# Patient Record
Sex: Male | Born: 1940
Health system: Southern US, Community
[De-identification: ages and names within clinical notes are randomized; demographics above are authoritative.]

## PROBLEM LIST (undated history)

## (undated) DIAGNOSIS — M1990 Unspecified osteoarthritis, unspecified site: Secondary | ICD-10-CM

## (undated) DIAGNOSIS — G709 Myoneural disorder, unspecified: Secondary | ICD-10-CM

## (undated) DIAGNOSIS — I4729 Other ventricular tachycardia: Secondary | ICD-10-CM

## (undated) DIAGNOSIS — G4730 Sleep apnea, unspecified: Secondary | ICD-10-CM

## (undated) DIAGNOSIS — I4510 Unspecified right bundle-branch block: Secondary | ICD-10-CM

## (undated) DIAGNOSIS — B019 Varicella without complication: Secondary | ICD-10-CM

## (undated) DIAGNOSIS — I10 Essential (primary) hypertension: Secondary | ICD-10-CM

## (undated) DIAGNOSIS — Z9289 Personal history of other medical treatment: Secondary | ICD-10-CM

## (undated) DIAGNOSIS — K219 Gastro-esophageal reflux disease without esophagitis: Secondary | ICD-10-CM

## (undated) DIAGNOSIS — K2970 Gastritis, unspecified, without bleeding: Secondary | ICD-10-CM

## (undated) DIAGNOSIS — K589 Irritable bowel syndrome without diarrhea: Secondary | ICD-10-CM

## (undated) DIAGNOSIS — I471 Supraventricular tachycardia, unspecified: Secondary | ICD-10-CM

## (undated) DIAGNOSIS — I700 Atherosclerosis of aorta: Secondary | ICD-10-CM

## (undated) DIAGNOSIS — I499 Cardiac arrhythmia, unspecified: Secondary | ICD-10-CM

## (undated) DIAGNOSIS — K635 Polyp of colon: Secondary | ICD-10-CM

## (undated) DIAGNOSIS — I509 Heart failure, unspecified: Secondary | ICD-10-CM

## (undated) DIAGNOSIS — H269 Unspecified cataract: Secondary | ICD-10-CM

## (undated) DIAGNOSIS — A048 Other specified bacterial intestinal infections: Secondary | ICD-10-CM

## (undated) DIAGNOSIS — I219 Acute myocardial infarction, unspecified: Secondary | ICD-10-CM

## (undated) DIAGNOSIS — Z8679 Personal history of other diseases of the circulatory system: Secondary | ICD-10-CM

## (undated) DIAGNOSIS — K5790 Diverticulosis of intestine, part unspecified, without perforation or abscess without bleeding: Secondary | ICD-10-CM

## (undated) DIAGNOSIS — F32A Depression, unspecified: Secondary | ICD-10-CM

## (undated) DIAGNOSIS — E785 Hyperlipidemia, unspecified: Secondary | ICD-10-CM

## (undated) DIAGNOSIS — F1020 Alcohol dependence, uncomplicated: Secondary | ICD-10-CM

## (undated) DIAGNOSIS — I519 Heart disease, unspecified: Secondary | ICD-10-CM

## (undated) DIAGNOSIS — C189 Malignant neoplasm of colon, unspecified: Secondary | ICD-10-CM

## (undated) DIAGNOSIS — I779 Disorder of arteries and arterioles, unspecified: Secondary | ICD-10-CM

## (undated) DIAGNOSIS — F1910 Other psychoactive substance abuse, uncomplicated: Secondary | ICD-10-CM

## (undated) DIAGNOSIS — T7840XA Allergy, unspecified, initial encounter: Secondary | ICD-10-CM

## (undated) DIAGNOSIS — R55 Syncope and collapse: Secondary | ICD-10-CM

## (undated) DIAGNOSIS — K5792 Diverticulitis of intestine, part unspecified, without perforation or abscess without bleeding: Secondary | ICD-10-CM

## (undated) DIAGNOSIS — C61 Malignant neoplasm of prostate: Secondary | ICD-10-CM

## (undated) DIAGNOSIS — F329 Major depressive disorder, single episode, unspecified: Secondary | ICD-10-CM

## (undated) DIAGNOSIS — I4891 Unspecified atrial fibrillation: Secondary | ICD-10-CM

## (undated) DIAGNOSIS — I639 Cerebral infarction, unspecified: Secondary | ICD-10-CM

## (undated) HISTORY — DX: Alcohol dependence, uncomplicated: F10.20

## (undated) HISTORY — DX: Unspecified osteoarthritis, unspecified site: M19.90

## (undated) HISTORY — DX: Polyp of colon: K63.5

## (undated) HISTORY — DX: Unspecified atrial fibrillation: I48.91

## (undated) HISTORY — DX: Myoneural disorder, unspecified: G70.9

## (undated) HISTORY — DX: Allergy, unspecified, initial encounter: T78.40XA

## (undated) HISTORY — DX: Personal history of other medical treatment: Z92.89

## (undated) HISTORY — DX: Malignant neoplasm of prostate: C61

## (undated) HISTORY — PX: CATARACT EXTRACTION, BILATERAL: SHX1313

## (undated) HISTORY — DX: Heart failure, unspecified: I50.9

## (undated) HISTORY — DX: Acute myocardial infarction, unspecified: I21.9

## (undated) HISTORY — PX: PROSTATE SURGERY: SHX751

## (undated) HISTORY — DX: Disorder of arteries and arterioles, unspecified: I77.9

## (undated) HISTORY — PX: ESOPHAGOGASTRODUODENOSCOPY: SHX1529

## (undated) HISTORY — PX: FRACTURE SURGERY: SHX138

## (undated) HISTORY — DX: Supraventricular tachycardia: I47.1

## (undated) HISTORY — DX: Supraventricular tachycardia, unspecified: I47.10

## (undated) HISTORY — DX: Irritable bowel syndrome, unspecified: K58.9

## (undated) HISTORY — DX: Other psychoactive substance abuse, uncomplicated: F19.10

## (undated) HISTORY — DX: Unspecified cataract: H26.9

## (undated) HISTORY — DX: Diverticulitis of intestine, part unspecified, without perforation or abscess without bleeding: K57.92

## (undated) HISTORY — DX: Essential (primary) hypertension: I10

## (undated) HISTORY — DX: Diverticulosis of intestine, part unspecified, without perforation or abscess without bleeding: K57.90

## (undated) HISTORY — PX: TONSILLECTOMY: SUR1361

## (undated) HISTORY — DX: Hyperlipidemia, unspecified: E78.5

## (undated) HISTORY — PX: KNEE ARTHROSCOPY: SUR90

## (undated) HISTORY — DX: Sleep apnea, unspecified: G47.30

## (undated) HISTORY — PX: EYE SURGERY: SHX253

## (undated) SURGERY — CLOSURE, COLOSTOMY, LAPAROSCOPIC
Anesthesia: General

---

## 1993-04-18 DIAGNOSIS — I4891 Unspecified atrial fibrillation: Secondary | ICD-10-CM

## 1993-04-18 HISTORY — DX: Unspecified atrial fibrillation: I48.91

## 1996-04-18 HISTORY — PX: CARDIAC CATHETERIZATION: SHX172

## 2010-04-18 DIAGNOSIS — C61 Malignant neoplasm of prostate: Secondary | ICD-10-CM

## 2010-04-18 HISTORY — DX: Malignant neoplasm of prostate: C61

## 2013-04-20 DIAGNOSIS — I10 Essential (primary) hypertension: Secondary | ICD-10-CM | POA: Diagnosis not present

## 2013-04-20 DIAGNOSIS — R109 Unspecified abdominal pain: Secondary | ICD-10-CM | POA: Diagnosis not present

## 2013-04-20 DIAGNOSIS — K589 Irritable bowel syndrome without diarrhea: Secondary | ICD-10-CM | POA: Diagnosis not present

## 2013-04-20 DIAGNOSIS — E785 Hyperlipidemia, unspecified: Secondary | ICD-10-CM | POA: Diagnosis not present

## 2013-04-24 DIAGNOSIS — L298 Other pruritus: Secondary | ICD-10-CM | POA: Diagnosis not present

## 2013-04-24 DIAGNOSIS — Z85828 Personal history of other malignant neoplasm of skin: Secondary | ICD-10-CM | POA: Diagnosis not present

## 2013-04-24 DIAGNOSIS — D294 Benign neoplasm of scrotum: Secondary | ICD-10-CM | POA: Diagnosis not present

## 2013-04-24 DIAGNOSIS — L089 Local infection of the skin and subcutaneous tissue, unspecified: Secondary | ICD-10-CM | POA: Diagnosis not present

## 2013-04-24 DIAGNOSIS — L570 Actinic keratosis: Secondary | ICD-10-CM | POA: Diagnosis not present

## 2013-04-24 DIAGNOSIS — L259 Unspecified contact dermatitis, unspecified cause: Secondary | ICD-10-CM | POA: Diagnosis not present

## 2013-04-24 DIAGNOSIS — L578 Other skin changes due to chronic exposure to nonionizing radiation: Secondary | ICD-10-CM | POA: Diagnosis not present

## 2013-04-24 DIAGNOSIS — R209 Unspecified disturbances of skin sensation: Secondary | ICD-10-CM | POA: Diagnosis not present

## 2013-05-27 DIAGNOSIS — E785 Hyperlipidemia, unspecified: Secondary | ICD-10-CM | POA: Diagnosis not present

## 2013-05-27 DIAGNOSIS — Z79899 Other long term (current) drug therapy: Secondary | ICD-10-CM | POA: Diagnosis not present

## 2013-05-27 DIAGNOSIS — Z0189 Encounter for other specified special examinations: Secondary | ICD-10-CM | POA: Diagnosis not present

## 2013-05-27 DIAGNOSIS — Z125 Encounter for screening for malignant neoplasm of prostate: Secondary | ICD-10-CM | POA: Diagnosis not present

## 2013-05-27 DIAGNOSIS — K219 Gastro-esophageal reflux disease without esophagitis: Secondary | ICD-10-CM | POA: Diagnosis not present

## 2013-05-27 DIAGNOSIS — I10 Essential (primary) hypertension: Secondary | ICD-10-CM | POA: Diagnosis not present

## 2013-05-27 DIAGNOSIS — E7800 Pure hypercholesterolemia, unspecified: Secondary | ICD-10-CM | POA: Diagnosis not present

## 2013-05-27 DIAGNOSIS — K589 Irritable bowel syndrome without diarrhea: Secondary | ICD-10-CM | POA: Diagnosis not present

## 2013-05-30 DIAGNOSIS — E785 Hyperlipidemia, unspecified: Secondary | ICD-10-CM | POA: Diagnosis not present

## 2013-05-30 DIAGNOSIS — I10 Essential (primary) hypertension: Secondary | ICD-10-CM | POA: Diagnosis not present

## 2013-06-18 DIAGNOSIS — C61 Malignant neoplasm of prostate: Secondary | ICD-10-CM | POA: Diagnosis not present

## 2013-06-18 DIAGNOSIS — E291 Testicular hypofunction: Secondary | ICD-10-CM | POA: Diagnosis not present

## 2013-06-27 DIAGNOSIS — E291 Testicular hypofunction: Secondary | ICD-10-CM | POA: Diagnosis not present

## 2013-06-27 DIAGNOSIS — R3129 Other microscopic hematuria: Secondary | ICD-10-CM | POA: Diagnosis not present

## 2013-06-27 DIAGNOSIS — C61 Malignant neoplasm of prostate: Secondary | ICD-10-CM | POA: Diagnosis not present

## 2013-06-27 DIAGNOSIS — D303 Benign neoplasm of bladder: Secondary | ICD-10-CM | POA: Diagnosis not present

## 2013-07-02 DIAGNOSIS — Z79899 Other long term (current) drug therapy: Secondary | ICD-10-CM | POA: Diagnosis not present

## 2013-07-02 DIAGNOSIS — E7800 Pure hypercholesterolemia, unspecified: Secondary | ICD-10-CM | POA: Diagnosis not present

## 2013-07-18 DIAGNOSIS — K5730 Diverticulosis of large intestine without perforation or abscess without bleeding: Secondary | ICD-10-CM | POA: Diagnosis not present

## 2013-07-18 DIAGNOSIS — N400 Enlarged prostate without lower urinary tract symptoms: Secondary | ICD-10-CM | POA: Diagnosis not present

## 2013-07-18 DIAGNOSIS — R3129 Other microscopic hematuria: Secondary | ICD-10-CM | POA: Diagnosis not present

## 2013-07-18 DIAGNOSIS — N281 Cyst of kidney, acquired: Secondary | ICD-10-CM | POA: Diagnosis not present

## 2013-07-25 DIAGNOSIS — R319 Hematuria, unspecified: Secondary | ICD-10-CM | POA: Diagnosis not present

## 2013-07-25 DIAGNOSIS — C61 Malignant neoplasm of prostate: Secondary | ICD-10-CM | POA: Diagnosis not present

## 2013-07-25 DIAGNOSIS — E291 Testicular hypofunction: Secondary | ICD-10-CM | POA: Diagnosis not present

## 2013-08-28 DIAGNOSIS — L570 Actinic keratosis: Secondary | ICD-10-CM | POA: Diagnosis not present

## 2013-08-28 DIAGNOSIS — C44221 Squamous cell carcinoma of skin of unspecified ear and external auricular canal: Secondary | ICD-10-CM | POA: Diagnosis not present

## 2013-08-28 DIAGNOSIS — L089 Local infection of the skin and subcutaneous tissue, unspecified: Secondary | ICD-10-CM | POA: Diagnosis not present

## 2013-08-28 DIAGNOSIS — Z85828 Personal history of other malignant neoplasm of skin: Secondary | ICD-10-CM | POA: Diagnosis not present

## 2013-08-28 DIAGNOSIS — L821 Other seborrheic keratosis: Secondary | ICD-10-CM | POA: Diagnosis not present

## 2013-08-28 DIAGNOSIS — L820 Inflamed seborrheic keratosis: Secondary | ICD-10-CM | POA: Diagnosis not present

## 2013-08-28 DIAGNOSIS — R21 Rash and other nonspecific skin eruption: Secondary | ICD-10-CM | POA: Diagnosis not present

## 2013-08-28 DIAGNOSIS — L259 Unspecified contact dermatitis, unspecified cause: Secondary | ICD-10-CM | POA: Diagnosis not present

## 2013-10-18 DIAGNOSIS — R110 Nausea: Secondary | ICD-10-CM | POA: Diagnosis not present

## 2013-10-18 DIAGNOSIS — R5381 Other malaise: Secondary | ICD-10-CM | POA: Diagnosis not present

## 2013-10-18 DIAGNOSIS — R509 Fever, unspecified: Secondary | ICD-10-CM | POA: Diagnosis not present

## 2013-10-18 DIAGNOSIS — R5383 Other fatigue: Secondary | ICD-10-CM | POA: Diagnosis not present

## 2013-10-18 DIAGNOSIS — J180 Bronchopneumonia, unspecified organism: Secondary | ICD-10-CM | POA: Diagnosis not present

## 2013-10-19 DIAGNOSIS — J441 Chronic obstructive pulmonary disease with (acute) exacerbation: Secondary | ICD-10-CM | POA: Diagnosis not present

## 2013-10-21 DIAGNOSIS — Z6830 Body mass index (BMI) 30.0-30.9, adult: Secondary | ICD-10-CM | POA: Diagnosis not present

## 2013-10-21 DIAGNOSIS — I10 Essential (primary) hypertension: Secondary | ICD-10-CM | POA: Diagnosis not present

## 2013-10-21 DIAGNOSIS — J209 Acute bronchitis, unspecified: Secondary | ICD-10-CM | POA: Diagnosis not present

## 2013-11-08 DIAGNOSIS — J018 Other acute sinusitis: Secondary | ICD-10-CM | POA: Diagnosis not present

## 2013-11-08 DIAGNOSIS — J069 Acute upper respiratory infection, unspecified: Secondary | ICD-10-CM | POA: Diagnosis not present

## 2013-11-08 DIAGNOSIS — I10 Essential (primary) hypertension: Secondary | ICD-10-CM | POA: Diagnosis not present

## 2013-11-08 DIAGNOSIS — Z6830 Body mass index (BMI) 30.0-30.9, adult: Secondary | ICD-10-CM | POA: Diagnosis not present

## 2013-11-19 DIAGNOSIS — I10 Essential (primary) hypertension: Secondary | ICD-10-CM | POA: Diagnosis not present

## 2013-11-19 DIAGNOSIS — J018 Other acute sinusitis: Secondary | ICD-10-CM | POA: Diagnosis not present

## 2013-11-19 DIAGNOSIS — J069 Acute upper respiratory infection, unspecified: Secondary | ICD-10-CM | POA: Diagnosis not present

## 2013-11-19 DIAGNOSIS — Z6830 Body mass index (BMI) 30.0-30.9, adult: Secondary | ICD-10-CM | POA: Diagnosis not present

## 2014-01-22 DIAGNOSIS — L570 Actinic keratosis: Secondary | ICD-10-CM | POA: Diagnosis not present

## 2014-01-22 DIAGNOSIS — L578 Other skin changes due to chronic exposure to nonionizing radiation: Secondary | ICD-10-CM | POA: Diagnosis not present

## 2014-01-22 DIAGNOSIS — R202 Paresthesia of skin: Secondary | ICD-10-CM | POA: Diagnosis not present

## 2014-01-22 DIAGNOSIS — L089 Local infection of the skin and subcutaneous tissue, unspecified: Secondary | ICD-10-CM | POA: Diagnosis not present

## 2014-01-22 DIAGNOSIS — X32XXXD Exposure to sunlight, subsequent encounter: Secondary | ICD-10-CM | POA: Diagnosis not present

## 2014-01-22 DIAGNOSIS — C44222 Squamous cell carcinoma of skin of right ear and external auricular canal: Secondary | ICD-10-CM | POA: Diagnosis not present

## 2014-01-23 DIAGNOSIS — C61 Malignant neoplasm of prostate: Secondary | ICD-10-CM | POA: Diagnosis not present

## 2014-01-30 DIAGNOSIS — C61 Malignant neoplasm of prostate: Secondary | ICD-10-CM | POA: Diagnosis not present

## 2014-01-30 DIAGNOSIS — E291 Testicular hypofunction: Secondary | ICD-10-CM | POA: Diagnosis not present

## 2014-01-30 DIAGNOSIS — R312 Other microscopic hematuria: Secondary | ICD-10-CM | POA: Diagnosis not present

## 2014-02-05 DIAGNOSIS — Z23 Encounter for immunization: Secondary | ICD-10-CM | POA: Diagnosis not present

## 2014-03-05 DIAGNOSIS — I959 Hypotension, unspecified: Secondary | ICD-10-CM | POA: Diagnosis not present

## 2014-03-05 DIAGNOSIS — X32XXXD Exposure to sunlight, subsequent encounter: Secondary | ICD-10-CM | POA: Diagnosis not present

## 2014-03-05 DIAGNOSIS — R55 Syncope and collapse: Secondary | ICD-10-CM | POA: Diagnosis not present

## 2014-03-05 DIAGNOSIS — L570 Actinic keratosis: Secondary | ICD-10-CM | POA: Diagnosis not present

## 2014-03-05 DIAGNOSIS — R208 Other disturbances of skin sensation: Secondary | ICD-10-CM | POA: Diagnosis not present

## 2014-03-05 DIAGNOSIS — L578 Other skin changes due to chronic exposure to nonionizing radiation: Secondary | ICD-10-CM | POA: Diagnosis not present

## 2014-04-03 DIAGNOSIS — C44222 Squamous cell carcinoma of skin of right ear and external auricular canal: Secondary | ICD-10-CM | POA: Diagnosis not present

## 2014-04-18 HISTORY — PX: COLONOSCOPY: SHX174

## 2014-06-16 DIAGNOSIS — R5383 Other fatigue: Secondary | ICD-10-CM | POA: Diagnosis not present

## 2014-06-16 DIAGNOSIS — E785 Hyperlipidemia, unspecified: Secondary | ICD-10-CM | POA: Diagnosis not present

## 2014-06-16 DIAGNOSIS — D075 Carcinoma in situ of prostate: Secondary | ICD-10-CM | POA: Diagnosis not present

## 2014-06-16 DIAGNOSIS — E780 Pure hypercholesterolemia: Secondary | ICD-10-CM | POA: Diagnosis not present

## 2014-06-16 DIAGNOSIS — I10 Essential (primary) hypertension: Secondary | ICD-10-CM | POA: Diagnosis not present

## 2014-06-22 DIAGNOSIS — M79642 Pain in left hand: Secondary | ICD-10-CM | POA: Diagnosis not present

## 2014-06-22 DIAGNOSIS — S61412A Laceration without foreign body of left hand, initial encounter: Secondary | ICD-10-CM | POA: Diagnosis not present

## 2014-06-29 DIAGNOSIS — M79642 Pain in left hand: Secondary | ICD-10-CM | POA: Diagnosis not present

## 2014-06-30 DIAGNOSIS — M1990 Unspecified osteoarthritis, unspecified site: Secondary | ICD-10-CM | POA: Diagnosis not present

## 2014-06-30 DIAGNOSIS — K219 Gastro-esophageal reflux disease without esophagitis: Secondary | ICD-10-CM | POA: Diagnosis not present

## 2014-06-30 DIAGNOSIS — E785 Hyperlipidemia, unspecified: Secondary | ICD-10-CM | POA: Diagnosis not present

## 2014-06-30 DIAGNOSIS — I10 Essential (primary) hypertension: Secondary | ICD-10-CM | POA: Diagnosis not present

## 2014-07-08 DIAGNOSIS — R911 Solitary pulmonary nodule: Secondary | ICD-10-CM | POA: Diagnosis not present

## 2014-07-08 DIAGNOSIS — I2510 Atherosclerotic heart disease of native coronary artery without angina pectoris: Secondary | ICD-10-CM | POA: Diagnosis not present

## 2014-07-21 DIAGNOSIS — R911 Solitary pulmonary nodule: Secondary | ICD-10-CM | POA: Diagnosis not present

## 2014-07-22 DIAGNOSIS — I890 Lymphedema, not elsewhere classified: Secondary | ICD-10-CM | POA: Diagnosis not present

## 2014-07-23 DIAGNOSIS — L539 Erythematous condition, unspecified: Secondary | ICD-10-CM | POA: Diagnosis not present

## 2014-07-23 DIAGNOSIS — L578 Other skin changes due to chronic exposure to nonionizing radiation: Secondary | ICD-10-CM | POA: Diagnosis not present

## 2014-07-23 DIAGNOSIS — Z85828 Personal history of other malignant neoplasm of skin: Secondary | ICD-10-CM | POA: Diagnosis not present

## 2014-07-23 DIAGNOSIS — X32XXXD Exposure to sunlight, subsequent encounter: Secondary | ICD-10-CM | POA: Diagnosis not present

## 2014-07-23 DIAGNOSIS — L570 Actinic keratosis: Secondary | ICD-10-CM | POA: Diagnosis not present

## 2014-07-23 DIAGNOSIS — L814 Other melanin hyperpigmentation: Secondary | ICD-10-CM | POA: Diagnosis not present

## 2014-07-23 DIAGNOSIS — L821 Other seborrheic keratosis: Secondary | ICD-10-CM | POA: Diagnosis not present

## 2014-07-23 DIAGNOSIS — C44519 Basal cell carcinoma of skin of other part of trunk: Secondary | ICD-10-CM | POA: Diagnosis not present

## 2014-07-23 DIAGNOSIS — L0889 Other specified local infections of the skin and subcutaneous tissue: Secondary | ICD-10-CM | POA: Diagnosis not present

## 2014-07-28 DIAGNOSIS — C61 Malignant neoplasm of prostate: Secondary | ICD-10-CM | POA: Diagnosis not present

## 2014-07-28 DIAGNOSIS — E291 Testicular hypofunction: Secondary | ICD-10-CM | POA: Diagnosis not present

## 2014-08-06 DIAGNOSIS — D485 Neoplasm of uncertain behavior of skin: Secondary | ICD-10-CM | POA: Diagnosis not present

## 2014-08-06 DIAGNOSIS — R208 Other disturbances of skin sensation: Secondary | ICD-10-CM | POA: Diagnosis not present

## 2014-08-06 DIAGNOSIS — L858 Other specified epidermal thickening: Secondary | ICD-10-CM | POA: Diagnosis not present

## 2014-08-06 DIAGNOSIS — L0889 Other specified local infections of the skin and subcutaneous tissue: Secondary | ICD-10-CM | POA: Diagnosis not present

## 2014-08-06 DIAGNOSIS — X32XXXD Exposure to sunlight, subsequent encounter: Secondary | ICD-10-CM | POA: Diagnosis not present

## 2014-08-06 DIAGNOSIS — L578 Other skin changes due to chronic exposure to nonionizing radiation: Secondary | ICD-10-CM | POA: Diagnosis not present

## 2014-08-06 DIAGNOSIS — B078 Other viral warts: Secondary | ICD-10-CM | POA: Diagnosis not present

## 2014-08-06 DIAGNOSIS — R234 Changes in skin texture: Secondary | ICD-10-CM | POA: Diagnosis not present

## 2014-08-07 DIAGNOSIS — E291 Testicular hypofunction: Secondary | ICD-10-CM | POA: Diagnosis not present

## 2014-08-07 DIAGNOSIS — C61 Malignant neoplasm of prostate: Secondary | ICD-10-CM | POA: Diagnosis not present

## 2014-08-07 DIAGNOSIS — R312 Other microscopic hematuria: Secondary | ICD-10-CM | POA: Diagnosis not present

## 2014-08-15 DIAGNOSIS — R490 Dysphonia: Secondary | ICD-10-CM | POA: Diagnosis not present

## 2014-08-15 DIAGNOSIS — J342 Deviated nasal septum: Secondary | ICD-10-CM | POA: Diagnosis not present

## 2014-08-15 DIAGNOSIS — R498 Other voice and resonance disorders: Secondary | ICD-10-CM | POA: Diagnosis not present

## 2014-08-15 DIAGNOSIS — Z720 Tobacco use: Secondary | ICD-10-CM | POA: Diagnosis not present

## 2014-09-02 DIAGNOSIS — M170 Bilateral primary osteoarthritis of knee: Secondary | ICD-10-CM | POA: Diagnosis not present

## 2014-09-02 DIAGNOSIS — I10 Essential (primary) hypertension: Secondary | ICD-10-CM | POA: Diagnosis not present

## 2014-09-02 DIAGNOSIS — Z6831 Body mass index (BMI) 31.0-31.9, adult: Secondary | ICD-10-CM | POA: Diagnosis not present

## 2014-09-03 DIAGNOSIS — M9901 Segmental and somatic dysfunction of cervical region: Secondary | ICD-10-CM | POA: Diagnosis not present

## 2014-09-03 DIAGNOSIS — M542 Cervicalgia: Secondary | ICD-10-CM | POA: Diagnosis not present

## 2014-09-03 DIAGNOSIS — M40292 Other kyphosis, cervical region: Secondary | ICD-10-CM | POA: Diagnosis not present

## 2014-09-03 DIAGNOSIS — M5412 Radiculopathy, cervical region: Secondary | ICD-10-CM | POA: Diagnosis not present

## 2014-09-04 DIAGNOSIS — M542 Cervicalgia: Secondary | ICD-10-CM | POA: Diagnosis not present

## 2014-09-04 DIAGNOSIS — M40292 Other kyphosis, cervical region: Secondary | ICD-10-CM | POA: Diagnosis not present

## 2014-09-04 DIAGNOSIS — M5412 Radiculopathy, cervical region: Secondary | ICD-10-CM | POA: Diagnosis not present

## 2014-09-04 DIAGNOSIS — M9901 Segmental and somatic dysfunction of cervical region: Secondary | ICD-10-CM | POA: Diagnosis not present

## 2014-09-05 DIAGNOSIS — M40292 Other kyphosis, cervical region: Secondary | ICD-10-CM | POA: Diagnosis not present

## 2014-09-05 DIAGNOSIS — M7061 Trochanteric bursitis, right hip: Secondary | ICD-10-CM | POA: Diagnosis not present

## 2014-09-05 DIAGNOSIS — M9901 Segmental and somatic dysfunction of cervical region: Secondary | ICD-10-CM | POA: Diagnosis not present

## 2014-09-05 DIAGNOSIS — M25561 Pain in right knee: Secondary | ICD-10-CM | POA: Diagnosis not present

## 2014-09-05 DIAGNOSIS — M170 Bilateral primary osteoarthritis of knee: Secondary | ICD-10-CM | POA: Diagnosis not present

## 2014-09-05 DIAGNOSIS — M542 Cervicalgia: Secondary | ICD-10-CM | POA: Diagnosis not present

## 2014-09-05 DIAGNOSIS — M5412 Radiculopathy, cervical region: Secondary | ICD-10-CM | POA: Diagnosis not present

## 2014-09-05 DIAGNOSIS — M25562 Pain in left knee: Secondary | ICD-10-CM | POA: Diagnosis not present

## 2014-09-05 DIAGNOSIS — M25551 Pain in right hip: Secondary | ICD-10-CM | POA: Diagnosis not present

## 2014-10-13 DIAGNOSIS — F329 Major depressive disorder, single episode, unspecified: Secondary | ICD-10-CM | POA: Diagnosis not present

## 2014-10-15 DIAGNOSIS — F4322 Adjustment disorder with anxiety: Secondary | ICD-10-CM | POA: Diagnosis not present

## 2014-10-15 DIAGNOSIS — Z6830 Body mass index (BMI) 30.0-30.9, adult: Secondary | ICD-10-CM | POA: Diagnosis not present

## 2014-10-15 DIAGNOSIS — F418 Other specified anxiety disorders: Secondary | ICD-10-CM | POA: Diagnosis not present

## 2014-10-15 DIAGNOSIS — I10 Essential (primary) hypertension: Secondary | ICD-10-CM | POA: Diagnosis not present

## 2014-10-21 DIAGNOSIS — F329 Major depressive disorder, single episode, unspecified: Secondary | ICD-10-CM | POA: Diagnosis not present

## 2014-10-29 DIAGNOSIS — S2096XA Insect bite (nonvenomous) of unspecified parts of thorax, initial encounter: Secondary | ICD-10-CM | POA: Diagnosis not present

## 2014-10-29 DIAGNOSIS — L0889 Other specified local infections of the skin and subcutaneous tissue: Secondary | ICD-10-CM | POA: Diagnosis not present

## 2014-10-29 DIAGNOSIS — X32XXXD Exposure to sunlight, subsequent encounter: Secondary | ICD-10-CM | POA: Diagnosis not present

## 2014-10-29 DIAGNOSIS — L578 Other skin changes due to chronic exposure to nonionizing radiation: Secondary | ICD-10-CM | POA: Diagnosis not present

## 2014-10-29 DIAGNOSIS — Z85828 Personal history of other malignant neoplasm of skin: Secondary | ICD-10-CM | POA: Diagnosis not present

## 2014-10-29 DIAGNOSIS — L539 Erythematous condition, unspecified: Secondary | ICD-10-CM | POA: Diagnosis not present

## 2014-10-29 DIAGNOSIS — R234 Changes in skin texture: Secondary | ICD-10-CM | POA: Diagnosis not present

## 2014-10-29 DIAGNOSIS — L298 Other pruritus: Secondary | ICD-10-CM | POA: Diagnosis not present

## 2014-10-29 DIAGNOSIS — R208 Other disturbances of skin sensation: Secondary | ICD-10-CM | POA: Diagnosis not present

## 2014-10-29 DIAGNOSIS — L570 Actinic keratosis: Secondary | ICD-10-CM | POA: Diagnosis not present

## 2014-10-30 DIAGNOSIS — F329 Major depressive disorder, single episode, unspecified: Secondary | ICD-10-CM | POA: Diagnosis not present

## 2014-11-12 DIAGNOSIS — L578 Other skin changes due to chronic exposure to nonionizing radiation: Secondary | ICD-10-CM | POA: Diagnosis not present

## 2014-11-12 DIAGNOSIS — S2096XA Insect bite (nonvenomous) of unspecified parts of thorax, initial encounter: Secondary | ICD-10-CM | POA: Diagnosis not present

## 2014-11-12 DIAGNOSIS — L570 Actinic keratosis: Secondary | ICD-10-CM | POA: Diagnosis not present

## 2014-11-12 DIAGNOSIS — X32XXXD Exposure to sunlight, subsequent encounter: Secondary | ICD-10-CM | POA: Diagnosis not present

## 2015-01-01 DIAGNOSIS — M25561 Pain in right knee: Secondary | ICD-10-CM | POA: Diagnosis not present

## 2015-01-01 DIAGNOSIS — M25562 Pain in left knee: Secondary | ICD-10-CM | POA: Diagnosis not present

## 2015-01-01 DIAGNOSIS — M170 Bilateral primary osteoarthritis of knee: Secondary | ICD-10-CM | POA: Diagnosis not present

## 2015-01-19 ENCOUNTER — Ambulatory Visit: Payer: Self-pay | Admitting: Family Medicine

## 2015-01-20 ENCOUNTER — Ambulatory Visit
Admission: RE | Admit: 2015-01-20 | Discharge: 2015-01-20 | Disposition: A | Discharge status: Home / Self Care | Payer: Medicare Other | Source: Ambulatory Visit | Attending: Family Medicine | Admitting: Family Medicine

## 2015-01-20 ENCOUNTER — Encounter: Payer: Self-pay | Admitting: Family Medicine

## 2015-01-20 ENCOUNTER — Ambulatory Visit (INDEPENDENT_AMBULATORY_CARE_PROVIDER_SITE_OTHER): Payer: Medicare Other | Admitting: Family Medicine

## 2015-01-20 VITALS — BP 130/82 | HR 68 | Temp 98.1°F | Ht 67.32 in | Wt 207.0 lb

## 2015-01-20 DIAGNOSIS — R600 Localized edema: Secondary | ICD-10-CM

## 2015-01-20 DIAGNOSIS — Z8546 Personal history of malignant neoplasm of prostate: Secondary | ICD-10-CM | POA: Diagnosis not present

## 2015-01-20 DIAGNOSIS — M7989 Other specified soft tissue disorders: Secondary | ICD-10-CM

## 2015-01-20 DIAGNOSIS — F4321 Adjustment disorder with depressed mood: Secondary | ICD-10-CM | POA: Insufficient documentation

## 2015-01-20 DIAGNOSIS — R229 Localized swelling, mass and lump, unspecified: Secondary | ICD-10-CM | POA: Diagnosis not present

## 2015-01-20 LAB — COMPREHENSIVE METABOLIC PANEL
ALK PHOS: 56 U/L (ref 39–117)
ALT: 9 U/L (ref 0–53)
AST: 15 U/L (ref 0–37)
Albumin: 3.9 g/dL (ref 3.5–5.2)
BUN: 13 mg/dL (ref 6–23)
CALCIUM: 9.5 mg/dL (ref 8.4–10.5)
CO2: 27 mEq/L (ref 19–32)
Chloride: 104 mEq/L (ref 96–112)
Creatinine, Ser: 1.07 mg/dL (ref 0.40–1.50)
GFR: 71.73 mL/min (ref >60.00)
GLUCOSE: 84 mg/dL (ref 70–99)
POTASSIUM: 4 meq/L (ref 3.5–5.1)
Sodium: 139 mEq/L (ref 135–145)
TOTAL PROTEIN: 6.8 g/dL (ref 6.0–8.3)
Total Bilirubin: 0.5 mg/dL (ref 0.2–1.2)

## 2015-01-20 LAB — CBC
HCT: 38.8 % — ABNORMAL LOW (ref 39.0–52.0)
HEMOGLOBIN: 12.9 g/dL — AB (ref 13.0–17.0)
MCHC: 33.3 g/dL (ref 30.0–36.0)
MCV: 88.8 fl (ref 78.0–100.0)
PLATELETS: 283 10*3/uL (ref 150.0–400.0)
RBC: 4.37 Mil/uL (ref 4.22–5.81)
RDW: 13.4 % (ref 11.5–15.5)
WBC: 8.2 10*3/uL (ref 4.0–10.5)

## 2015-01-20 LAB — TSH: TSH: 1.88 u[IU]/mL (ref 0.35–4.50)

## 2015-01-20 LAB — PSA: PSA: 0.09 ng/mL — ABNORMAL LOW (ref 0.10–4.00)

## 2015-01-20 NOTE — Assessment & Plan Note (Addendum)
Chronic issue over the past year at least. Differential includes DVT, venous insufficiency, obstruction related to prior cancer, thyroid dysfunction, anemia, liver or renal dysfunction. Unilateral swelling makes CHF unlikely cause. Suspect most likely is venous insufficiency, though given cancer history will obtain RLE [place] to evaluate further. Check TSH, CBC, and CMET as well. Check PSA for prostate. Will also request records from former PCP to see what work up has been done. Patient notes has not had an [place]. Given return precautions.

## 2015-01-20 NOTE — Patient Instructions (Addendum)
Nice to meet you. We will check some blood work and call with the results. We will obtain an ultrasound to evaluate your leg. Please go to this appointment at 12:45. We will refer you to dermatology and urology.  If you have increased swelling, chest pain, shortness of breath, palpitations, pain in your leg, thoughts of hurting yourself or others, or new symptoms please seek medical attention.

## 2015-01-20 NOTE — Progress Notes (Signed)
Patient ID: Cameron Perna., male   DOB: 1940/11/23, 74 y.o.   MRN: [id number]  Cameron Rumps, MD Phone: [phone]  Cameron Pigeon Abdulhamid Olgin. is a 74 y.o. male who presents today for new patient visit.  History of prostate cancer: notes finished radiation in 1012. Atributes his cancer to having his BPH microwaved and all the complications from that procedure. Notes good stream, though has to wait a long time to empty his bladder fully. Has sexual dysfunction from the radiation. Also had what sounds like androgen deprivation. States PSA has been <1 for a number of years.   Skin nodule: has nodule on left fore arm that has been present for 5-6 weeks. Started small and has gotten bigger. Is mildly tender. Has history of skin cancer removed from face and ear and had [facility] for this. No lesions elsewhere. Has had lots of sun exposure. Is unsure of the name of the skin cancer he has had. Has been unable to get in to see a dermatologist for this.   Grief reaction: lost his son early in the summer. Died of possible drug overdose and cardiac issues. Had a hard time following this. Went to see a Social worker for this and felt this helped significantly. Still occasionally feels sad about this. No SI or HI. PHQ9 4. Not interested in seeing a counselor again.   RLE edema: notes this has been present for 1-3 years. Is a chronic issue. Comes and goes. Goes down at night and comes back during the day. No pain in the RLE. No CHF symptoms. NO history of kidney or liver dysfunction. No history of anemia. No history of blood clot. Has history of prostate cancer. States he had ABIs in [place] for this and then they stopped the work up. No recent surgeries or periods of prolonged immobility.   Active Ambulatory Problems    Diagnosis Date Noted  . History of prostate cancer 01/20/2015  . Skin nodule 01/20/2015  . Grief reaction 01/20/2015  . Swelling of right lower extremity 01/20/2015   Resolved Ambulatory Problems   Diagnosis Date Noted  . No Resolved Ambulatory Problems   Past Medical History  Diagnosis Date  . Alcoholism ([place])   . Arthritis   . Prostate cancer ([place])   . Diverticulitis   . Fainting   . Arrhythmia   . Hypertension   . Hyperlipidemia   . Colon polyp   . Vertigo     Family History  Problem Relation Age of Onset  . Lung cancer      parent  . Bipolar disorder Son   . Sudden death Son     due to drug over dose and cardiac issues    Social History   Social History  . Marital Status: Married    Spouse Name: N/A  . Number of Children: N/A  . Years of Education: N/A   Occupational History  . Not on file.   Social History Main Topics  . Smoking status: Former Research scientist (life sciences)  . Smokeless tobacco: Current User    Types: Chew  . Alcohol Use: 0.6 oz/week    1 Standard drinks or equivalent per week  . Drug Use: No  . Sexual Activity: Not on file   Other Topics Concern  . Not on file   Social History Narrative  . No narrative on file    ROS   General:  Negative for unexplained weight loss, fever Skin: positive for new skin lesion HEENT: Negative for trouble hearing,  trouble seeing, ringing in ears, mouth sores, hoarseness, change in voice, dysphagia. CV:  Negative for chest pain, dyspnea, edema, palpitations Resp: Negative for cough, dyspnea, hemoptysis GI: Negative for nausea, vomiting, diarrhea, constipation, abdominal pain, melena, hematochezia. GU: positive for sexual difficulty, Negative for dysuria, incontinence, urinary hesitance, hematuria, vaginal or penile discharge, polyuria, lumps in testicle or breasts MSK: Negative for muscle cramps or aches, joint pain or swelling Neuro: Negative for headaches, weakness, numbness, dizziness, passing out/fainting Psych: positive for sadness, Negative for depression, anxiety, memory problems  Objective  Physical Exam Filed Vitals:   01/20/15 1020  BP: 130/82  Pulse: 68  Temp: 98.1 F (36.7 C)    Physical Exam    Constitutional: He is well-developed, well-nourished, and in no distress.  HENT:  Head: Normocephalic and atraumatic.  Right Ear: External ear normal.  Left Ear: External ear normal.  Mouth/Throat: Oropharynx is clear and moist. No oropharyngeal exudate.  Eyes: Conjunctivae are normal. Pupils are equal, round, and reactive to light.  Neck: Neck supple.  Cardiovascular: Normal rate, regular rhythm and normal heart sounds.  Exam reveals no gallop and no friction rub.   No murmur heard. Pulmonary/Chest: Effort normal and breath sounds normal. No respiratory distress. He has no wheezes. He has no rales.  Abdominal: Soft. Bowel sounds are normal. He exhibits no distension and no mass. There is no tenderness. There is no rebound and no guarding.  Genitourinary: Rectum normal and penis normal.  Prostate smooth with no nodules or enlargement, normal testicles, no inguinal hernia, no inguinal LAD  Musculoskeletal:  RLE with 1+ pitting edema to the knee, visibly larger than LLE, no edema LLE, no calf tenderness or cords, negative homans sign  Lymphadenopathy:    He has no cervical adenopathy.  Neurological: He is alert. Gait normal.  Skin: Skin is warm and dry. He is not diaphoretic.  Psychiatric: Mood and affect normal.     Assessment/Plan:   History of prostate cancer History of this treated with radiation. Normal prostate exam today. Will check PSA and refer to urology for local follow-up.   Skin nodule Nodule noted for past several weeks. Appears consistent with keratoacanthoma, though could be BCC in patient with skin cancer history. Discussed returning to the office for a biopsy sometime in the next week. Will refer to dermatology as well given history of skin cancer.   Grief reaction Related to sons death this past summer. Is improved. Discussed option of seeing a counselor for further management, though patient declined stating that he felt improved. No SI or HI. Will continue to  monitor. Given return precautions.   Swelling of right lower extremity Chronic issue over the past year at least. Differential includes DVT, venous insufficiency, obstruction related to prior cancer, thyroid dysfunction, anemia, liver or renal dysfunction. Unilateral swelling makes CHF unlikely cause. Suspect most likely is venous insufficiency, though given cancer history will obtain RLE [place] to evaluate further. Check TSH, CBC, and CMET as well. Check PSA for prostate. Will also request records from former PCP to see what work up has been done. Patient notes has not had an [place]. Given return precautions.     Orders Placed This Encounter  Procedures  . US Venous Img Lower Unilateral Right    Hold pt    Standing Status: Future     Number of Occurrences:      Standing Expiration Date: 03/21/2016    Order Specific Question:  Reason for Exam (SYMPTOM  OR DIAGNOSIS REQUIRED)  Answer:  right lower extremity US    Order Specific Question:  Preferred imaging location?    Answer:  [facility]    Order Specific Question:  Call Report- Best Contact Number?    Answer:  [phone]  . Comp Met (CMET)  . CBC  . PSA  . TSH  . Ambulatory referral to Urology    Referral Priority:  Routine    Referral Type:  Consultation    Referral Reason:  Specialty Services Required    Requested Specialty:  Urology    Number of Visits Requested:  1  . Ambulatory referral to Dermatology    Referral Priority:  Routine    Referral Type:  Consultation    Referral Reason:  Specialty Services Required    Requested Specialty:  Dermatology    Number of Visits Requested:  1    Cameron Foster

## 2015-01-20 NOTE — Assessment & Plan Note (Signed)
History of this treated with radiation. Normal prostate exam today. Will check PSA and refer to urology for local follow-up.

## 2015-01-20 NOTE — Progress Notes (Signed)
Pre visit review using our clinic review tool, if applicable. No additional management support is needed unless otherwise documented below in the visit note. 

## 2015-01-20 NOTE — Assessment & Plan Note (Signed)
Nodule noted for past several weeks. Appears consistent with keratoacanthoma, though could be BCC in patient with skin cancer history. Discussed returning to the office for a biopsy sometime in the next week. Will refer to dermatology as well given history of skin cancer.

## 2015-01-20 NOTE — Assessment & Plan Note (Signed)
Related to sons death this past summer. Is improved. Discussed option of seeing a counselor for further management, though patient declined stating that he felt improved. No SI or HI. Will continue to monitor. Given return precautions.

## 2015-01-21 ENCOUNTER — Telehealth: Payer: Self-pay | Admitting: Family Medicine

## 2015-01-21 NOTE — Telephone Encounter (Signed)
Called and advised patient of lab results. He reports his last PSA 6 months ago was 0.05. Discussed that he would nee to see urology and advised of his appointment time. Discussed that the [place] of his RLE did not reveal a DVT and that I would like to review the records we requested to see what other work up has been done prior to determining what if any further work needs to be done. Once records are received will call patient to discuss further work up.

## 2015-01-28 NOTE — Telephone Encounter (Signed)
Attempted to call the patient to discuss this further. There was a busy signal on his home phone and his mobile went straight to VM. Left message asking him to call back to the office. No medical information or identifying information was left on the VM.  Reviewed records. Appears ABIs were the only work the patient had with regards to the swelling in his leg. He now has had a negative US of the limb. Given his history of prostate cancer he would benefit from CT scan to evaluate for any lesions leading to this swelling. Will await his call back to discuss this.

## 2015-01-29 NOTE — Telephone Encounter (Signed)
Spoke with patient. Discussed obtaining a CT scan to evaluate for mass lesion leading to RLE swelling. He states he would like to do this, though does not have time currently with his wife's cancer diagnosis and would like to wait to do this when he is ready. Advised that he should call our office when he is ready to schedule this imaging test.

## 2015-01-30 DIAGNOSIS — Z23 Encounter for immunization: Secondary | ICD-10-CM | POA: Diagnosis not present

## 2015-02-02 DIAGNOSIS — C44629 Squamous cell carcinoma of skin of left upper limb, including shoulder: Secondary | ICD-10-CM | POA: Diagnosis not present

## 2015-02-02 DIAGNOSIS — D485 Neoplasm of uncertain behavior of skin: Secondary | ICD-10-CM | POA: Diagnosis not present

## 2015-02-02 DIAGNOSIS — L570 Actinic keratosis: Secondary | ICD-10-CM | POA: Diagnosis not present

## 2015-02-06 ENCOUNTER — Ambulatory Visit: Payer: Self-pay

## 2015-02-06 DIAGNOSIS — D0462 Carcinoma in situ of skin of left upper limb, including shoulder: Secondary | ICD-10-CM | POA: Diagnosis not present

## 2015-02-06 DIAGNOSIS — C44629 Squamous cell carcinoma of skin of left upper limb, including shoulder: Secondary | ICD-10-CM | POA: Diagnosis not present

## 2015-02-12 ENCOUNTER — Ambulatory Visit (INDEPENDENT_AMBULATORY_CARE_PROVIDER_SITE_OTHER): Payer: Medicare Other | Admitting: Urology

## 2015-02-12 ENCOUNTER — Encounter: Payer: Self-pay | Admitting: Urology

## 2015-02-12 VITALS — BP 132/82 | HR 92 | Ht 69.0 in | Wt 207.6 lb

## 2015-02-12 DIAGNOSIS — Z8546 Personal history of malignant neoplasm of prostate: Secondary | ICD-10-CM

## 2015-02-12 DIAGNOSIS — N529 Male erectile dysfunction, unspecified: Secondary | ICD-10-CM

## 2015-02-12 DIAGNOSIS — N400 Enlarged prostate without lower urinary tract symptoms: Secondary | ICD-10-CM | POA: Diagnosis not present

## 2015-02-12 NOTE — Progress Notes (Signed)
02/12/2015 2:59 PM   Cameron Foster [birth year] [id number]  Referring provider: Leone Haven, MD [address] [place], [place] [address]  Chief Complaint  Patient presents with  . Prostate Cancer    New Patient    HPI: The patient is a 74 year old gentleman who presents for follow-up of his history of prostate cancer. The patient had Gleason 7 prostate cancer treated with radiation in 6 months of androgen deprivation in February 2012. He also has a history of a TUMT with protracted retrograde ejaculation and erectile dysfunction. His PSAs have gone from 0.4-0.28. He was last seen in April 2016 his urologist in [place]. His PSA at that time was 0.04. He followed up with a new primary care provider in [place] where his PSA was 0.09. He presents today for routine follow-up. He does complain still of some irritation of skin from radiation. He has no other urinary complaints this time outside of nocturia 2. He does have erectile dysfunction but is not interested in erections this time.   PMH: Past Medical History  Diagnosis Date  . Alcoholism ([place])   . Arthritis   . Prostate cancer ([place])   . Diverticulitis   . Fainting   . Arrhythmia   . Hypertension   . Hyperlipidemia   . Colon polyp   . Vertigo   . Atrial fibrillation [facility])     Surgical History: Past Surgical History  Procedure Laterality Date  . Knee arthroscopy      Home Medications:    Medication List       This list is accurate as of: 02/12/15  2:59 PM.  Always use your most recent med list.               amLODipine 5 MG tablet  Commonly known as:  NORVASC  5 mg once daily.     atenolol 25 MG tablet  Commonly known as:  TENORMIN  25 mg once daily.     lisinopril 10 MG tablet  Commonly known as:  PRINIVIL,ZESTRIL  10 mg once daily.     omeprazole 40 MG capsule  Commonly known as:  PRILOSEC  Take 40 mg by mouth daily.        Allergies:  Allergies  Allergen Reactions   . Formaldehyde Other (See Comments)    Family History: Family History  Problem Relation Age of Onset  . Lung cancer      parent  . Bipolar disorder Son   . Sudden death Son     due to drug over dose and cardiac issues  . Prostate cancer Neg Hx   . Bladder Cancer Neg Hx     Social History:  reports that he has quit smoking. His smokeless tobacco use includes Chew. He reports that he drinks about 0.6 oz of alcohol per week. He reports that he does not use illicit drugs.  ROS: UROLOGY Frequent Urination?: Yes Hard to postpone urination?: No Burning/pain with urination?: No Get up at night to urinate?: Yes Leakage of urine?: No Urine stream starts and stops?: No Trouble starting stream?: No Do you have to strain to urinate?: No Blood in urine?: No Urinary tract infection?: No Sexually transmitted disease?: No Injury to kidneys or bladder?: No Painful intercourse?: No Weak stream?: No Erection problems?: Yes Penile pain?: No  Gastrointestinal Nausea?: No Vomiting?: No Indigestion/heartburn?: Yes Diarrhea?: No Constipation?: Yes  Constitutional Fever: No Night sweats?: No Weight loss?: No Fatigue?: Yes  Skin Skin rash/lesions?:  No Itching?: Yes  Eyes Blurred vision?: No Double vision?: No  Ears/Nose/Throat Sore throat?: No Sinus problems?: Yes  Hematologic/Lymphatic Swollen glands?: No Easy bruising?: No  Cardiovascular Leg swelling?: Yes Chest pain?: No  Respiratory Cough?: No Shortness of breath?: Yes  Endocrine Excessive thirst?: No  Musculoskeletal Back pain?: No Joint pain?: Yes  Neurological Headaches?: No Dizziness?: Yes  Psychologic Depression?: No Anxiety?: No  Physical Exam: BP 132/82 mmHg  Pulse 92  Ht 5' 9"  (1.753 m)  Wt 207 lb 9.6 oz (94.167 kg)  BMI 30.64 kg/m2  Constitutional:  Alert and oriented, No acute distress. HEENT: [place] AT, moist mucus membranes.  Trachea midline, no masses. Cardiovascular: No clubbing,  cyanosis, or edema. Respiratory: Normal respiratory effort, no increased work of breathing. GI: Abdomen is soft, nontender, nondistended, no abdominal masses GU: No CVA tenderness. Normal phallus. Testicles ascended the equally bilaterally. No masses. Nontender palpation. DRE: 1+. Smooth no nodules. Skin: No rashes, bruises or suspicious lesions. Lymph: No cervical or inguinal adenopathy. Neurologic: Grossly intact, no focal deficits, moving all 4 extremities. Psychiatric: Normal mood and affect.  Laboratory Data: Lab Results  Component Value Date   WBC 8.2 01/20/2015   HGB 12.9* 01/20/2015   HCT 38.8* 01/20/2015   MCV 88.8 01/20/2015   PLT 283.0 01/20/2015    Lab Results  Component Value Date   CREATININE 1.07 01/20/2015    Lab Results  Component Value Date   PSA 0.09* 01/20/2015    No results found for: TESTOSTERONE  No results found for: HGBA1C  Urinalysis No results found for: COLORURINE, APPEARANCEUR, LABSPEC, PHURINE, GLUCOSEU, HGBUR, BILIRUBINUR, KETONESUR, PROTEINUR, UROBILINOGEN, NITRITE, LEUKOCYTESUR   Assessment & Plan:    1.  Gleason 7 Prostate cancer status post external beam radiation in 2012 with a 6 month course of androgen deprivation We will repeat his PSA today. It was recently 0.09. Six months ago it was 0.04 but this was done in [place], and there has been proven to be some variance in PSA between labs. We'll have the patient follow up in 6 months to repeat his PSA. He seems to be doing otherwise well from his prostate cancer treatment.  2. Erectile dysfunction The patient is anxious and treatment of this time.  3. BPH The patient is not bothered by his urinary symptoms at this time. No further treatment at this time.  Return in about 6 months (around 08/13/2015) for with PSA prior.  Nickie Retort, MD  [facility] [address] [place], [place] [address] [phone]

## 2015-02-13 LAB — PSA

## 2015-03-06 DIAGNOSIS — M170 Bilateral primary osteoarthritis of knee: Secondary | ICD-10-CM | POA: Diagnosis not present

## 2015-03-16 DIAGNOSIS — M170 Bilateral primary osteoarthritis of knee: Secondary | ICD-10-CM | POA: Diagnosis not present

## 2015-03-23 DIAGNOSIS — M170 Bilateral primary osteoarthritis of knee: Secondary | ICD-10-CM | POA: Diagnosis not present

## 2015-04-21 ENCOUNTER — Encounter: Payer: Self-pay | Admitting: Family Medicine

## 2015-04-27 ENCOUNTER — Encounter: Payer: Self-pay | Admitting: Family Medicine

## 2015-04-28 ENCOUNTER — Encounter: Payer: Self-pay | Admitting: Family Medicine

## 2015-05-11 ENCOUNTER — Telehealth: Payer: Self-pay | Admitting: *Deleted

## 2015-05-11 NOTE — Telephone Encounter (Signed)
Patient has stated Mcarthur Rossetti will faxing over a paper to to Dr. Caryl Bis explain the reason he should be taking atenolol (TENORMIN) 25 MG tablet . Please Contact Patient with any questions [phone]

## 2015-05-13 ENCOUNTER — Encounter: Payer: Self-pay | Admitting: Family Medicine

## 2015-07-17 DIAGNOSIS — J329 Chronic sinusitis, unspecified: Secondary | ICD-10-CM | POA: Diagnosis not present

## 2015-07-17 DIAGNOSIS — B9689 Other specified bacterial agents as the cause of diseases classified elsewhere: Secondary | ICD-10-CM | POA: Diagnosis not present

## 2015-07-29 ENCOUNTER — Encounter: Payer: Self-pay | Admitting: Family Medicine

## 2015-07-29 NOTE — Telephone Encounter (Signed)
Secondary message.

## 2015-07-30 ENCOUNTER — Telehealth: Payer: Self-pay | Admitting: Family Medicine

## 2015-07-30 ENCOUNTER — Ambulatory Visit (INDEPENDENT_AMBULATORY_CARE_PROVIDER_SITE_OTHER): Payer: Medicare Other | Admitting: Family Medicine

## 2015-07-30 ENCOUNTER — Encounter: Payer: Self-pay | Admitting: Family Medicine

## 2015-07-30 VITALS — BP 122/80 | HR 70 | Temp 98.2°F | Ht 69.0 in | Wt 208.4 lb

## 2015-07-30 DIAGNOSIS — E669 Obesity, unspecified: Secondary | ICD-10-CM | POA: Diagnosis not present

## 2015-07-30 DIAGNOSIS — G629 Polyneuropathy, unspecified: Secondary | ICD-10-CM | POA: Diagnosis not present

## 2015-07-30 DIAGNOSIS — R1030 Lower abdominal pain, unspecified: Secondary | ICD-10-CM

## 2015-07-30 DIAGNOSIS — Z8546 Personal history of malignant neoplasm of prostate: Secondary | ICD-10-CM | POA: Diagnosis not present

## 2015-07-30 DIAGNOSIS — M791 Myalgia, unspecified site: Secondary | ICD-10-CM

## 2015-07-30 DIAGNOSIS — R52 Pain, unspecified: Secondary | ICD-10-CM | POA: Diagnosis not present

## 2015-07-30 DIAGNOSIS — J0110 Acute frontal sinusitis, unspecified: Secondary | ICD-10-CM

## 2015-07-30 DIAGNOSIS — K5900 Constipation, unspecified: Secondary | ICD-10-CM

## 2015-07-30 LAB — COMPREHENSIVE METABOLIC PANEL
ALBUMIN: 4 g/dL (ref 3.5–5.2)
ALK PHOS: 53 U/L (ref 39–117)
ALT: 16 U/L (ref 0–53)
AST: 27 U/L (ref 0–37)
BILIRUBIN TOTAL: 0.6 mg/dL (ref 0.2–1.2)
BUN: 17 mg/dL (ref 6–23)
CALCIUM: 9.6 mg/dL (ref 8.4–10.5)
CHLORIDE: 104 meq/L (ref 96–112)
CO2: 28 mEq/L (ref 19–32)
CREATININE: 1.32 mg/dL (ref 0.40–1.50)
GFR: 56.21 mL/min — ABNORMAL LOW (ref >60.00)
Glucose, Bld: 98 mg/dL (ref 70–99)
Potassium: 4.1 mEq/L (ref 3.5–5.1)
Sodium: 139 mEq/L (ref 135–145)
TOTAL PROTEIN: 7 g/dL (ref 6.0–8.3)

## 2015-07-30 LAB — C-REACTIVE PROTEIN: CRP: 2.5 mg/dL (ref 0.5–20.0)

## 2015-07-30 LAB — HEMOGLOBIN A1C: Hgb A1c MFr Bld: 5.9 % (ref 4.6–6.5)

## 2015-07-30 LAB — CK: CK TOTAL: 608 U/L — AB (ref 7–232)

## 2015-07-30 LAB — PSA: PSA: 0.05 ng/mL — ABNORMAL LOW (ref 0.10–4.00)

## 2015-07-30 LAB — SEDIMENTATION RATE: SED RATE: 37 mm/h — AB (ref 0–22)

## 2015-07-30 LAB — VITAMIN B12: Vitamin B-12: 224 pg/mL (ref 211–911)

## 2015-07-30 MED ORDER — HYDROCORTISONE 2.5 % EX CREA: TOPICAL_CREAM | Freq: Two times a day (BID) | CUTANEOUS | Status: DC

## 2015-07-30 MED ORDER — DOXYCYCLINE HYCLATE 100 MG PO TABS: 100.0000 mg | ORAL_TABLET | Freq: Two times a day (BID) | ORAL | Status: DC

## 2015-07-30 NOTE — Telephone Encounter (Signed)
Pt called wanting to know where the second rx that Dr. Caryl Bis was going to send to the pharmacy. Pt is at the pharmacy now. [facility]

## 2015-07-30 NOTE — Telephone Encounter (Signed)
Called and spoke with patient regarding lab work results. CK is elevated indicating some level of muscle injury as cause of his myalgias. Reconfirmed that his myalgias have been going on for years and have slowly gotten worse. No acute worsening of this. Kidney function is relatively stable. Discussed his other lab work as well. Sedimentation rate appears to be at the upper limit of normal for his age. Discussed that he needed to stay well hydrated over the weekend to help with this. Discussed that it did not appear that he had elevated inflammation in his body. Discussed that we would treat him for sinusitis and not diverticulitis at this time. He notes that his sinus congestion and pressure has been worsening. We'll send in doxycycline. He will return next week for lab work to further evaluate his myalgias. I discussed that if he were to develop worsening myalgias, continued to feel poorly, developed fevers, or any new symptoms over the weekend or changing any symptoms he should seek medical attention at a walk-in clinic or urgent care. He voiced understanding.

## 2015-07-30 NOTE — Patient Instructions (Signed)
Nice to see you. We will call you with your lab work. The CT scan has been ordered and we will call you to set this up. If you develop worsening pain, or develop chest pain, shortness of breath, worsening abdominal pain, or any new or changing symptoms please seek medical attention.

## 2015-07-30 NOTE — Progress Notes (Signed)
Pre visit review using our clinic review tool, if applicable. No additional management support is needed unless otherwise documented below in the visit note. 

## 2015-07-30 NOTE — Telephone Encounter (Signed)
Spoke with patient and advised that Dr. Caryl Bis is waiting on lab work before sending in [place] for antibiotic.

## 2015-08-01 ENCOUNTER — Encounter: Payer: Self-pay | Admitting: Emergency Medicine

## 2015-08-01 ENCOUNTER — Emergency Department
Admission: EM | Admit: 2015-08-01 | Discharge: 2015-08-01 | Disposition: A | Discharge status: Home / Self Care | Payer: Medicare Other | Source: Home / Self Care | Attending: Emergency Medicine | Admitting: Emergency Medicine

## 2015-08-01 ENCOUNTER — Encounter: Payer: Self-pay | Admitting: *Deleted

## 2015-08-01 ENCOUNTER — Emergency Department
Admission: EM | Admit: 2015-08-01 | Discharge: 2015-08-01 | Disposition: A | Discharge status: Home / Self Care | Payer: Medicare Other | Attending: Emergency Medicine | Admitting: Emergency Medicine

## 2015-08-01 DIAGNOSIS — R339 Retention of urine, unspecified: Secondary | ICD-10-CM | POA: Insufficient documentation

## 2015-08-01 DIAGNOSIS — E785 Hyperlipidemia, unspecified: Secondary | ICD-10-CM | POA: Insufficient documentation

## 2015-08-01 DIAGNOSIS — N3289 Other specified disorders of bladder: Secondary | ICD-10-CM | POA: Diagnosis not present

## 2015-08-01 DIAGNOSIS — Z79899 Other long term (current) drug therapy: Secondary | ICD-10-CM | POA: Insufficient documentation

## 2015-08-01 DIAGNOSIS — M1990 Unspecified osteoarthritis, unspecified site: Secondary | ICD-10-CM | POA: Insufficient documentation

## 2015-08-01 DIAGNOSIS — Z86010 Personal history of colonic polyps: Secondary | ICD-10-CM | POA: Insufficient documentation

## 2015-08-01 DIAGNOSIS — Z8546 Personal history of malignant neoplasm of prostate: Secondary | ICD-10-CM

## 2015-08-01 DIAGNOSIS — I499 Cardiac arrhythmia, unspecified: Secondary | ICD-10-CM | POA: Insufficient documentation

## 2015-08-01 DIAGNOSIS — Z792 Long term (current) use of antibiotics: Secondary | ICD-10-CM | POA: Insufficient documentation

## 2015-08-01 DIAGNOSIS — I10 Essential (primary) hypertension: Secondary | ICD-10-CM | POA: Insufficient documentation

## 2015-08-01 DIAGNOSIS — R319 Hematuria, unspecified: Secondary | ICD-10-CM

## 2015-08-01 DIAGNOSIS — Z720 Tobacco use: Secondary | ICD-10-CM | POA: Insufficient documentation

## 2015-08-01 DIAGNOSIS — N401 Enlarged prostate with lower urinary tract symptoms: Secondary | ICD-10-CM | POA: Diagnosis not present

## 2015-08-01 DIAGNOSIS — K5792 Diverticulitis of intestine, part unspecified, without perforation or abscess without bleeding: Secondary | ICD-10-CM | POA: Insufficient documentation

## 2015-08-01 DIAGNOSIS — F1020 Alcohol dependence, uncomplicated: Secondary | ICD-10-CM | POA: Insufficient documentation

## 2015-08-01 DIAGNOSIS — R338 Other retention of urine: Secondary | ICD-10-CM | POA: Diagnosis not present

## 2015-08-01 DIAGNOSIS — Z87891 Personal history of nicotine dependence: Secondary | ICD-10-CM | POA: Insufficient documentation

## 2015-08-01 DIAGNOSIS — R310 Gross hematuria: Secondary | ICD-10-CM | POA: Diagnosis not present

## 2015-08-01 DIAGNOSIS — I4891 Unspecified atrial fibrillation: Secondary | ICD-10-CM | POA: Insufficient documentation

## 2015-08-01 LAB — URINALYSIS COMPLETE WITH MICROSCOPIC (ARMC ONLY)
BACTERIA UA: NONE SEEN
BILIRUBIN URINE: NEGATIVE
GLUCOSE, UA: NEGATIVE mg/dL
Leukocytes, UA: NEGATIVE
Nitrite: NEGATIVE
Protein, ur: 100 mg/dL — AB
SQUAMOUS EPITHELIAL / LPF: NONE SEEN
Specific Gravity, Urine: 1.007 (ref 1.005–1.030)
Specific Gravity, Urine: 1.011 (ref 1.005–1.030)
Squamous Epithelial / LPF: NONE SEEN
WBC UA: NONE SEEN WBC/hpf (ref 0–5)
pH: 8 (ref 5.0–8.0)

## 2015-08-01 LAB — PROTIME-INR
INR: 1.11
PROTHROMBIN TIME: 14.5 s (ref 11.4–15.0)

## 2015-08-01 LAB — CBC WITH DIFFERENTIAL/PLATELET
BASOS ABS: 0 10*3/uL (ref 0–0.1)
BASOS ABS: 0 10*3/uL (ref 0–0.1)
BASOS PCT: 1 %
BASOS PCT: 1 %
EOS ABS: 0.2 10*3/uL (ref 0–0.7)
EOS PCT: 1 %
Eosinophils Absolute: 0 10*3/uL (ref 0–0.7)
Eosinophils Relative: 2 %
HCT: 36.7 % — ABNORMAL LOW (ref 40.0–52.0)
HCT: 36.7 % — ABNORMAL LOW (ref 40.0–52.0)
Hemoglobin: 12.3 g/dL — ABNORMAL LOW (ref 13.0–18.0)
Hemoglobin: 12.3 g/dL — ABNORMAL LOW (ref 13.0–18.0)
LYMPHS PCT: 11 %
Lymphocytes Relative: 13 %
Lymphs Abs: 1 10*3/uL (ref 1.0–3.6)
Lymphs Abs: 1 10*3/uL (ref 1.0–3.6)
MCH: 29.4 pg (ref 26.0–34.0)
MCH: 29.2 pg (ref 26.0–34.0)
MCHC: 33.5 g/dL (ref 32.0–36.0)
MCHC: 33.5 g/dL (ref 32.0–36.0)
MCV: 87.7 fL (ref 80.0–100.0)
MCV: 87.2 fL (ref 80.0–100.0)
MONO ABS: 0.8 10*3/uL (ref 0.2–1.0)
Monocytes Absolute: 0.8 10*3/uL (ref 0.2–1.0)
Monocytes Relative: 10 %
Monocytes Relative: 9 %
NEUTROS ABS: 7.2 10*3/uL — AB (ref 1.4–6.5)
NEUTROS PCT: 74 %
Neutro Abs: 5.9 10*3/uL (ref 1.4–6.5)
Neutrophils Relative %: 80 %
PLATELETS: 229 10*3/uL (ref 150–440)
Platelets: 237 10*3/uL (ref 150–440)
RBC: 4.18 MIL/uL — AB (ref 4.40–5.90)
RBC: 4.21 MIL/uL — AB (ref 4.40–5.90)
RDW: 13.6 % (ref 11.5–14.5)
RDW: 13.5 % (ref 11.5–14.5)
WBC: 8 10*3/uL (ref 3.8–10.6)
WBC: 9.1 10*3/uL (ref 3.8–10.6)

## 2015-08-01 LAB — BASIC METABOLIC PANEL
ANION GAP: 6 (ref 5–15)
Anion gap: 5 (ref 5–15)
BUN: 15 mg/dL (ref 6–20)
BUN: 13 mg/dL (ref 6–20)
CALCIUM: 8.9 mg/dL (ref 8.9–10.3)
CALCIUM: 9.1 mg/dL (ref 8.9–10.3)
CHLORIDE: 107 mmol/L (ref 101–111)
CHLORIDE: 107 mmol/L (ref 101–111)
CO2: 23 mmol/L (ref 22–32)
CO2: 23 mmol/L (ref 22–32)
Creatinine, Ser: 1.27 mg/dL — ABNORMAL HIGH (ref 0.61–1.24)
Creatinine, Ser: 1.17 mg/dL (ref 0.61–1.24)
GFR calc Af Amer: >60 mL/min (ref >60)
GFR calc non Af Amer: 60 mL/min — ABNORMAL LOW (ref >60)
GFR, EST NON AFRICAN AMERICAN: 54 mL/min — AB (ref >60)
GLUCOSE: 111 mg/dL — AB (ref 65–99)
Glucose, Bld: 111 mg/dL — ABNORMAL HIGH (ref 65–99)
POTASSIUM: 3.5 mmol/L (ref 3.5–5.1)
Potassium: 3.7 mmol/L (ref 3.5–5.1)
SODIUM: 135 mmol/L (ref 135–145)
SODIUM: 136 mmol/L (ref 135–145)

## 2015-08-01 MED ORDER — BELLADONNA ALKALOIDS-OPIUM 16.2-60 MG RE SUPP: 1.0000 | RECTAL | Status: DC

## 2015-08-01 MED ORDER — MORPHINE SULFATE (PF) 4 MG/ML IV SOLN
4.0000 mg | Freq: Once | INTRAVENOUS | Status: AC
  Administered 2015-08-01: 4 mg via INTRAVENOUS

## 2015-08-01 MED ORDER — ACETAMINOPHEN 325 MG PO TABS
650.0000 mg | ORAL_TABLET | Freq: Once | ORAL | Status: AC
  Administered 2015-08-01: 650 mg via ORAL
  Filled 2015-08-01: qty 2

## 2015-08-01 MED ORDER — ONDANSETRON HCL 4 MG/2ML IJ SOLN
4.0000 mg | Freq: Once | INTRAMUSCULAR | Status: AC
  Administered 2015-08-01: 4 mg via INTRAVENOUS

## 2015-08-01 MED ORDER — SODIUM CHLORIDE 0.9 % IV BOLUS (SEPSIS)
500.0000 mL | Freq: Once | INTRAVENOUS | Status: AC
  Administered 2015-08-01: 500 mL via INTRAVENOUS

## 2015-08-01 MED ORDER — ONDANSETRON HCL 4 MG/2ML IJ SOLN
INTRAMUSCULAR | Status: AC
  Administered 2015-08-01: 4 mg via INTRAVENOUS
  Filled 2015-08-01: qty 2

## 2015-08-01 MED ORDER — TAMSULOSIN HCL 0.4 MG PO CAPS: 0.4000 mg | ORAL_CAPSULE | Freq: Every day | ORAL | Status: DC

## 2015-08-01 MED ORDER — HYOSCYAMINE SULFATE 0.125 MG SL SUBL: 0.1250 mg | SUBLINGUAL_TABLET | SUBLINGUAL | Status: DC | PRN

## 2015-08-01 MED ORDER — MORPHINE SULFATE (PF) 4 MG/ML IV SOLN
INTRAVENOUS | Status: AC
  Administered 2015-08-01: 4 mg via INTRAVENOUS
  Filled 2015-08-01: qty 1

## 2015-08-01 MED ORDER — LIDOCAINE HCL 2 % EX GEL
1.0000 "application " | Freq: Once | CUTANEOUS | Status: AC
  Administered 2015-08-01: 1 via URETHRAL

## 2015-08-01 NOTE — Progress Notes (Signed)
Patient ID: Cameron Foster., male   DOB: April 29, 1940, 75 y.o.   MRN: [id number]  I saw Cameron Foster this morning and placed a foley and evacuated clots.  He returns with urgency and an attempt at irrigation was unsuccessful by the ER physician.    His urine is clear in the tube.    I irrigated him again but he irrigated easily with very clear return.  I believe he was just having bladder spasms.     I am going to send a script for levsin for the spasms.   He should f/u Tuesday for a voiding trial as previously recommended.

## 2015-08-01 NOTE — ED Notes (Signed)
Pt verbalized understanding of the need for follow up care and discharge instructions. NAD at this time.

## 2015-08-01 NOTE — Consult Note (Signed)
Subjective: CC: I can't pee.  Hx: Cameron Foster is a 75 yo WM who I was asked to see in consultation by Cameron Foster for urinary retention. He is seen by Cameron Foster in our office and has a history of prostate cancer. The patient had Gleason 7 prostate cancer treated with radiation in 6 months of androgen deprivation in February 2012. He also has a history of a TUMT with protracted retrograde ejaculation and erectile dysfunction. His PSAs have gone from 0.4-0.28. He was last seen in April 2016 his urologist in [place]. His PSA at that time was 0.04. He followed up with a new primary care provider in [place] where his PSA was 0.09. He came to the ER this last night and attempt at foley placement was unsuccessful.   He was able to void but returned this morning with recurrent retention and cath attempt was once again successful.   His PVR by [place] was 474m.  He has voided again and passed some old clot but his PVR is still about 2070m  His discomfort was relieved.   He reports he has been taking BC for the last few days for unrelated pain.    ROS:  Review of Systems  All other systems reviewed and are negative.   Allergies  Allergen Reactions  . Formaldehyde Other (See Comments)    Past Medical History  Diagnosis Date  . Alcoholism ([facility]  . Arthritis   . [facility]    treated with radiation therapy.  . Diverticulitis   . Fainting   . Arrhythmia   . Hypertension   . Hyperlipidemia   . Colon polyp   . Vertigo   . [facility]    Past Surgical History  Procedure Laterality Date  . Knee arthroscopy    . Prostate surgery      Microwave therapy    Social History   Social History  . Marital Status: Married    Spouse Name: N/A  . Number of Children: N/A  . Years of Education: N/A   Occupational History  . Not on file.   Social History Main Topics  . Smoking status: Former SmResearch scientist (life sciences). Smokeless tobacco: Current User    Types: Chew  . Alcohol  Use: 0.6 oz/week    1 Standard drinks or equivalent per week  . Drug Use: No  . Sexual Activity: Not on file   Other Topics Concern  . Not on file   Social History Narrative    Family History  Problem Relation Age of Onset  . Lung cancer      parent  . Bipolar disorder Son   . Sudden death Son     due to drug over dose and cardiac issues  . Prostate cancer Neg Hx   . Bladder Cancer Neg Hx     Anti-infectives: Anti-infectives    None      No current facility-administered medications for this encounter.   Current Outpatient Prescriptions  Medication Sig Dispense Refill  . amLODipine (NORVASC) 5 MG tablet Take 5 mg by mouth every night at bedtime.    . Marland Kitchentenolol (TENORMIN) 25 MG tablet Take 25 mg by mouth once daily.    . Marland Kitchenoxycycline (VIBRA-TABS) 100 MG tablet Take 1 tablet (100 mg total) by mouth 2 (two) times daily. 14 tablet 0  . hydrocortisone 2.5 % cream Apply topically 2 (two) times daily. 30 g 1  . lisinopril (PRINIVIL,ZESTRIL) 10 MG tablet Take  10 mg by mouth once daily.    . Multiple Vitamin (MULTIVITAMIN) tablet Take 1 tablet by mouth daily.    Marland Kitchen omeprazole (PRILOSEC) 40 MG capsule Take 40 mg by mouth daily as needed (for heartburn/indigestion.).     Marland Kitchen sennosides-docusate sodium (SENOKOT-S) 8.6-50 MG tablet Take 2 tablets by mouth every morning.       Objective: Vital signs in last 24 hours: Temp:  [97.7 F (36.5 C)-97.9 F (36.6 C)] 97.7 F (36.5 C) (04/15 0916) Pulse Rate:  [69-94] 69 (04/15 0916) Resp:  [16-22] 16 (04/15 0555) BP: (115-153)/(76-95) 150/76 mmHg (04/15 0916) SpO2:  [95 %-99 %] 96 % (04/15 0916) Weight:  [94.348 kg (208 lb)] 94.348 kg (208 lb) (04/15 0916)  Intake/Output from previous day:   Intake/Output this shift:     Physical Exam  Constitutional: He is oriented to person, place, and time and well-developed, well-nourished, and in no distress.  HENT:  Head: Normocephalic and atraumatic.  Cardiovascular: Normal rate and  regular rhythm.   Pulmonary/Chest: Effort normal and breath sounds normal.  Abdominal: Soft. He exhibits no distension and no mass. There is no tenderness.  Genitourinary:  Normal penis with adequate meatus.   Scrotum and contents normal.   Musculoskeletal: Normal range of motion. He exhibits no edema or tenderness.  Neurological: He is alert and oriented to person, place, and time.  Skin: Skin is warm and dry.  Psychiatric: Mood and affect normal.    Lab Results:   Recent Labs  08/01/15 0419 08/01/15 1056  WBC 8.0 9.1  HGB 12.3* 12.3*  HCT 36.7* 36.7*  PLT 229 237   BMET  Recent Labs  08/01/15 0419 08/01/15 1056  NA 135 136  K 3.5 3.7  CL 107 107  CO2 23 23  GLUCOSE 111* 111*  BUN 15 13  CREATININE 1.27* 1.17  CALCIUM 8.9 9.1   PT/INR  Recent Labs  08/01/15 1056  LABPROT 14.5  INR 1.11   ABG No results for input(s): PHART, HCO3 in the last 72 hours.  Invalid input(s): PCO2, PO2  Studies/Results: No results found.  Care discussed with Cameron Foster.   I have reviewed records from his 10/16 visit with Cameron Foster.  Labs reviewed.  Procedure:  Complex foley placement with cystoscopy, urethral dilation and clot evacuatation.  See dictation. [id number]  Assessment: Clot retention with hematuria probably secondary to radiation cystitis and recent ASA use.   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  I will have him f/u Tuesday for a voiding trial and he may need to be considered for a TURP because of the obstructing adhesions.    CC: Cameron Foster and Cameron Foster.     Naleah Kofoed J 08/01/2015 [phone]

## 2015-08-01 NOTE — ED Notes (Signed)
Attempted to place foley with 54f catheter and 16 fr coude without success.  Dr. GArchie Balboato be notified

## 2015-08-01 NOTE — ED Notes (Signed)
Pt reports feeling lightheaded and like he needs to urinate and have a BM.  VSS, pt assisted to toilet and privacy provided.

## 2015-08-01 NOTE — ED Provider Notes (Addendum)
[facility] Emergency Department Provider Note  ____________________________________________   I have reviewed the triage vital signs and the nursing notes.   HISTORY  Chief Complaint Urinary Retention and Hematuria    HPI Cameron Foster. is a 75 y.o. male with a history of prostate cancer, had  treatment with radiation in the pastwith no resection, has a history of urinary retention in the past as well. Patient states that this morning he was having an a trace of blood and could not pass urine. He came to the emergency room or multiple times were made to pass a Foley that were unsuccessful. The patient then was able to void on his own, he did not 2 and was sent home. Unfortunately he has not been able to void since he went home. He states he does not have any significant pain at this time. He does feel that he started to have lower abdominal distention again. He states that he was passing some clots at one point. He denies being on anticoagulation medications. No dysuria or urinary frequency no UTI symptoms no flank pain no vomiting.     Past Medical History  Diagnosis Date  . Alcoholism ([place])   . Arthritis   . Prostate cancer ([place])   . Diverticulitis   . Fainting   . Arrhythmia   . Hypertension   . Hyperlipidemia   . Colon polyp   . Vertigo   . Atrial fibrillation [facility])     Patient Active Problem List   Diagnosis Date Noted  . History of prostate cancer 01/20/2015  . Skin nodule 01/20/2015  . Grief reaction 01/20/2015  . Swelling of right lower extremity 01/20/2015    Past Surgical History  Procedure Laterality Date  . Knee arthroscopy      Current Outpatient Rx  Name  Route  Sig  Dispense  Refill  . amLODipine (NORVASC) 5 MG tablet      Take 5 mg by mouth every night at bedtime.         Marland Kitchen atenolol (TENORMIN) 25 MG tablet      Take 25 mg by mouth once daily.         Marland Kitchen doxycycline (VIBRA-TABS) 100 MG tablet   Oral   Take 1  tablet (100 mg total) by mouth 2 (two) times daily.   14 tablet   0   . hydrocortisone 2.5 % cream   Topical   Apply topically 2 (two) times daily.   30 g   1   . lisinopril (PRINIVIL,ZESTRIL) 10 MG tablet      Take 10 mg by mouth once daily.         . Multiple Vitamin (MULTIVITAMIN) tablet   Oral   Take 1 tablet by mouth daily.         Marland Kitchen omeprazole (PRILOSEC) 40 MG capsule   Oral   Take 40 mg by mouth daily as needed (for heartburn/indigestion.).          Marland Kitchen sennosides-docusate sodium (SENOKOT-S) 8.6-50 MG tablet   Oral   Take 2 tablets by mouth every morning.           Allergies Formaldehyde  Family History  Problem Relation Age of Onset  . Lung cancer      parent  . Bipolar disorder Son   . Sudden death Son     due to drug over dose and cardiac issues  . Prostate cancer Neg Hx   . Bladder Cancer Neg  Hx     Social History Social History  Substance Use Topics  . Smoking status: Former Research scientist (life sciences)  . Smokeless tobacco: Current User    Types: Chew  . Alcohol Use: 0.6 oz/week    1 Standard drinks or equivalent per week    Review of Systems Constitutional: No fever/chills Eyes: No visual changes. ENT: No sore throat. No stiff neck no neck pain Cardiovascular: Denies chest pain. Respiratory: Denies shortness of breath. Gastrointestinal:   no vomiting.  No diarrhea.  No constipation. Genitourinary: See history of present illness. Musculoskeletal: Negative lower extremity swelling Skin: Negative for rash. Neurological: Negative for headaches, focal weakness or numbness. 10-point ROS otherwise negative.  ____________________________________________   PHYSICAL EXAM:  VITAL SIGNS: ED Triage Vitals  Enc Vitals Group     BP 08/01/15 0916 150/76 mmHg     Pulse Rate 08/01/15 0916 69     Resp --      Temp 08/01/15 0916 97.7 F (36.5 C)     Temp Source 08/01/15 0916 Oral     SpO2 08/01/15 0916 96 %     Weight 08/01/15 0916 208 lb (94.348 kg)      Height 08/01/15 0916 5' 9"  (1.753 m)     Head Cir --      Peak Flow --      Pain Score 08/01/15 0917 2     Pain Loc --      Pain Edu? --      Excl. in [place]? --     Constitutional: Alert and oriented. Well appearing and in no acute distress. Eyes: Conjunctivae are normal. PERRL. EOMI. Head: Atraumatic. Nose: No congestion/rhinnorhea. Mouth/Throat: Mucous membranes are moist.  Oropharynx non-erythematous. Neck: No stridor.   Nontender with no meningismus Cardiovascular: Normal rate, regular rhythm. Grossly normal heart sounds.  Good peripheral circulation. Respiratory: Normal respiratory effort.  No retractions. Lungs CTAB. Abdominal: Soft and Lytes reveal tenderness but no significant tympanic fullness. No distention. No guarding no rebound Back:  There is no focal tenderness or step off there is no midline tenderness there are no lesions noted. there is no CVA tenderness Normal external male genitalia no masses or lesions noted Musculoskeletal: No lower extremity tenderness. No joint effusions, no DVT signs strong distal pulses no edema Neurologic:  Normal speech and language. No gross focal neurologic deficits are appreciated.  Skin:  Skin is warm, dry and intact. No rash noted. Psychiatric: Mood and affect are normal. Speech and behavior are normal.  ____________________________________________   LABS (all labs ordered are listed, but only abnormal results are displayed)  Labs Reviewed  URINE CULTURE  URINALYSIS COMPLETEWITH MICROSCOPIC ([place])  BASIC METABOLIC PANEL  PROTIME-INR  CBC WITH DIFFERENTIAL/PLATELET   ____________________________________________  EKG  I personally interpreted any EKGs ordered by me or triage ____________________________________________  RADIOLOGY  I reviewed any imaging ordered by me or triage that were performed during my shift and, if possible, patient and/or family made aware of any abnormal  findings. ____________________________________________   PROCEDURES  Procedure(s) performed: None  Critical Care performed: None  ____________________________________________   INITIAL IMPRESSION / ASSESSMENT AND PLAN / ED COURSE  Pertinent labs & imaging results that were available during my care of the patient were reviewed by me and considered in my medical decision making (see chart for details).  Patient with urinary retention and unable to void at home. Hematuria. We did attempt 2 to pass a 16 coud Foley however we were unable to pass it. This is the  second time he has been to the emergency room for this symptom the last 12 hours. We will obtain a bladder scan and talk to urology.  ----------------------------------------- 11:31 AM on 08/01/2015 -----------------------------------------  Urologist agrees with management and will come evaluate the patient. 450 is residual volume.  ----------------------------------------- 2:44 PM on 08/01/2015 -----------------------------------------  Urology was able to place a Foley catheter, patient tolerated the procedure well, they did it under direct cystoscopy it appears. Patient feels much better we'll discharge him home. He will follow-up with his urologist return precautions given and understood. No antibiotics indicated for urology. ____________________________________________   FINAL CLINICAL IMPRESSION(S) / ED DIAGNOSES  Final diagnoses:  None      This chart was dictated using voice recognition software.  Despite best efforts to proofread,  errors can occur which can change meaning.     Schuyler Amor, MD 08/01/15 [place], MD 08/01/15 [place], MD 08/01/15 [phone]

## 2015-08-01 NOTE — Discharge Instructions (Signed)
Acute Urinary Retention, Male °Acute urinary retention is the temporary inability to urinate. °This is a common problem in older men. As men age their prostates become larger and block the flow of urine from the bladder. This is usually a problem that has come on gradually.  °HOME CARE INSTRUCTIONS °If you are sent home with a Foley catheter and a drainage system, you will need to discuss the best course of action with your health care provider. While the catheter is in, maintain a good intake of fluids. Keep the drainage bag emptied and lower than your catheter. This is so that contaminated urine will not flow back into your bladder, which could lead to a urinary tract infection. °There are two main types of drainage bags. One is a large bag that usually is used at night. It has a good capacity that will allow you to sleep through the night without having to empty it. The second type is called a leg bag. It has a smaller capacity, so it needs to be emptied more frequently. However, the main advantage is that it can be attached by a leg strap and can go underneath your clothing, allowing you the freedom to move about or leave your home. °Only take over-the-counter or prescription medicines for pain, discomfort, or fever as directed by your health care provider.  °SEEK MEDICAL CARE IF: °· You develop a low-grade fever. °· You experience spasms or leakage of urine with the spasms. °SEEK IMMEDIATE MEDICAL CARE IF:  °· You develop chills or fever. °· Your catheter stops draining urine. °· Your catheter falls out. °· You start to develop increased bleeding that does not respond to rest and increased fluid intake. °MAKE SURE YOU: °· Understand these instructions. °· Will watch your condition. °· Will get help right away if you are not doing well or get worse. °  °This information is not intended to replace advice given to you by your health care provider. Make sure you discuss any questions you have with your health care  provider. °  °Document Released: 07/11/2000 Document Revised: 08/19/2014 Document Reviewed: 09/13/2012 °Elsevier Interactive Patient Education ©2016 [employer] Inc[employer] ° °

## 2015-08-01 NOTE — ED Notes (Signed)
Bladder scan performed. Pt has 449m present.

## 2015-08-01 NOTE — ED Notes (Signed)
MD Quale at bedside. 

## 2015-08-01 NOTE — ED Notes (Signed)
Patient states he was seen here earlier this am for blood clots in urine. States he was told to follow up with urology, but now is having urinary retention after passing several more clots at home.

## 2015-08-01 NOTE — ED Notes (Signed)
Pt arrived to ED from home after d/c from ED today. Pt had urinary cath placed today in this ED by urologist. Pt returned due to lack of urine drainage into bag. MD Quale at bedside at this time attempting to flush catheter. Pt AAOx4, NAD noted.

## 2015-08-01 NOTE — ED Provider Notes (Signed)
[facility] Emergency Department Provider Note  ____________________________________________  Time seen: Approximately 7:17 PM  I have reviewed the triage vital signs and the nursing notes.   HISTORY  Chief Complaint Urinary Retention    HPI Cameron Foster. is a 75 y.o. male history of prostate cancer.  The patient started developing urinary retention yesterday. He was seen in the ER twice, by urology earlier and sent home with a urinary catheter.  The patient reports that he is been unable to urinate and having significant lower abdominal discomfort again.  No fevers chills. No nausea or vomiting.  severe 10 out of 10 pain as though he cannot urinate. Described as pressure  Past Medical History  Diagnosis Date  . Alcoholism ([place])   . Arthritis   . Prostate cancer [facility])     treated with radiation therapy.  . Diverticulitis   . Fainting   . Arrhythmia   . Hypertension   . Hyperlipidemia   . Colon polyp   . Vertigo   . Atrial fibrillation [facility]     Patient Active Problem List   Diagnosis Date Noted  . History of prostate cancer 01/20/2015  . Skin nodule 01/20/2015  . Grief reaction 01/20/2015  . Swelling of right lower extremity 01/20/2015    Past Surgical History  Procedure Laterality Date  . Knee arthroscopy    . Prostate surgery      Microwave therapy    Current Outpatient Rx  Name  Route  Sig  Dispense  Refill  . amLODipine (NORVASC) 5 MG tablet      Take 5 mg by mouth every night at bedtime.         Marland Kitchen atenolol (TENORMIN) 25 MG tablet      Take 25 mg by mouth once daily.         Marland Kitchen doxycycline (VIBRA-TABS) 100 MG tablet   Oral   Take 1 tablet (100 mg total) by mouth 2 (two) times daily.   14 tablet   0   . hydrocortisone 2.5 % cream   Topical   Apply topically 2 (two) times daily.   30 g   1   . hyoscyamine (LEVSIN SL) 0.125 MG SL tablet   Sublingual   Place 1 tablet (0.125 mg total) under the tongue  every 4 (four) hours as needed for cramping.   12 tablet   1   . lisinopril (PRINIVIL,ZESTRIL) 10 MG tablet      Take 10 mg by mouth once daily.         . Multiple Vitamin (MULTIVITAMIN) tablet   Oral   Take 1 tablet by mouth daily.         Marland Kitchen omeprazole (PRILOSEC) 40 MG capsule   Oral   Take 40 mg by mouth daily as needed (for heartburn/indigestion.).          Marland Kitchen sennosides-docusate sodium (SENOKOT-S) 8.6-50 MG tablet   Oral   Take 2 tablets by mouth every morning.         . tamsulosin (FLOMAX) 0.4 MG CAPS capsule   Oral   Take 1 capsule (0.4 mg total) by mouth daily.   10 capsule   0     Allergies Formaldehyde  Family History  Problem Relation Age of Onset  . Lung cancer      parent  . Bipolar disorder Son   . Sudden death Son     due to drug over dose and cardiac issues  .  Prostate cancer Neg Hx   . Bladder Cancer Neg Hx     Social History Social History  Substance Use Topics  . Smoking status: Former Research scientist (life sciences)  . Smokeless tobacco: Current User    Types: Chew  . Alcohol Use: 0.6 oz/week    1 Standard drinks or equivalent per week    Review of Systems Constitutional: No fever/chills Eyes: No visual changes. ENT: No sore throat. Cardiovascular: Denies chest pain. Respiratory: Denies shortness of breath. Gastrointestinal: No abdominal painExcept for his "bladder".  No nausea, no vomiting.  No diarrhea.  No constipation. Genitourinary: See history of present illness Musculoskeletal: Negative for back pain. Skin: Negative for rash. Neurological: Negative for headaches, focal weakness or numbness.  10-point ROS otherwise negative.  ____________________________________________   PHYSICAL EXAM:  VITAL SIGNS: ED Triage Vitals  Enc Vitals Group     BP 08/01/15 1823 145/90 mmHg     Pulse Rate 08/01/15 1823 74     Resp 08/01/15 1823 16     Temp 08/01/15 1823 98.1 F (36.7 C)     Temp Source 08/01/15 1823 Oral     SpO2 08/01/15 1823 99 %      Weight 08/01/15 1823 208 lb (94.348 kg)     Height 08/01/15 1823 5' 8"  (1.727 m)     Head Cir --      Peak Flow --      Pain Score 08/01/15 1822 10     Pain Loc --      Pain Edu? --      Excl. in [place]? --    Constitutional: Alert and oriented. Well appearing and in no acute distress. Eyes: Conjunctivae are normal. PERRL. EOMI. Head: Atraumatic. Nose: No congestion/rhinnorhea. Mouth/Throat: Mucous membranes are moist.  Oropharynx non-erythematous. Neck: No stridor.   Cardiovascular: Normal rate, regular rhythm. Grossly normal heart sounds.  Good peripheral circulation. Respiratory: Normal respiratory effort.  No retractions. Lungs CTAB. Gastrointestinal: Soft and nontender except for still some moderate tenderness suprapubically. No distention. No abdominal bruits. No CVA tenderness. Patient has a urinary catheter in place, normal-appearing penis. Normal-appearing testicles and perineum. Musculoskeletal: No lower extremity tenderness nor edema.  No joint effusions. Neurologic:  Normal speech and language. No gross focal neurologic deficits are appreciated. No gait instability. Skin:  Skin is warm, dry and intact. No rash noted. Psychiatric: Mood and affect are normal. Speech and behavior are normal.  ____________________________________________   LABS (all labs ordered are listed, but only abnormal results are displayed)  Labs Reviewed - No data to display ____________________________________________  EKG   ____________________________________________  RADIOLOGY   ____________________________________________   PROCEDURES  Procedure(s) performed: None  Critical Care performed: No  ____________________________________________   INITIAL IMPRESSION / ASSESSMENT AND PLAN / ED COURSE  Pertinent labs & imaging results that were available during my care of the patient were reviewed by me and considered in my medical decision making (see chart for details).  Awake alert,  in pain. No rebound guarding or evidence of an acute abdomen. Does appear that he is not able to get drainage out of his urinary catheter, and likely blockage or malpositioning is causing his discomfort.  Patient presents for lower abdominal discomfort and feeling as if he cannot urinate again.  Patient was seen by Dr. Roni Bread of urology in the ER, patient's catheter was reevaluated and is now draining urine well. Patient's pain is relieved. Plan to discharge home, Dr. Roni Bread provided patient prescription, and will provide follow-up care.  Return precautions and  treatment recommendations and follow-up discussed with the patient who is agreeable with the plan.  ____________________________________________   FINAL CLINICAL IMPRESSION(S) / ED DIAGNOSES  Final diagnoses:  Urinary retention      Delman Kitten, MD 08/01/15 1943

## 2015-08-01 NOTE — Discharge Instructions (Signed)
As discussed please follow up with urology. Please return for any further retention. Please seek medical attention for any high fevers, chest pain, shortness of breath, change in behavior, persistent vomiting, bloody stool or any other new or concerning symptoms.   Hematuria, Adult Hematuria is blood in your urine. It can be caused by a bladder infection, kidney infection, prostate infection, kidney stone, or cancer of your urinary tract. Infections can usually be treated with medicine, and a kidney stone usually will pass through your urine. If neither of these is the cause of your hematuria, further workup to find out the reason may be needed. It is very important that you tell your health care provider about any blood you see in your urine, even if the blood stops without treatment or happens without causing pain. Blood in your urine that happens and then stops and then happens again can be a symptom of a very serious condition. Also, pain is not a symptom in the initial stages of many urinary cancers. HOME CARE INSTRUCTIONS   Drink lots of fluid, 3-4 quarts a day. If you have been diagnosed with an infection, cranberry juice is especially recommended, in addition to large amounts of water.  Avoid caffeine, tea, and carbonated beverages because they tend to irritate the bladder.  Avoid alcohol because it may irritate the prostate.  Take all medicines as directed by your health care provider.  If you were prescribed an antibiotic medicine, finish it all even if you start to feel better.  If you have been diagnosed with a kidney stone, follow your health care provider's instructions regarding straining your urine to catch the stone.  Empty your bladder often. Avoid holding urine for long periods of time.  After a bowel movement, women should cleanse front to back. Use each tissue only once.  Empty your bladder before and after sexual intercourse if you are a male. SEEK MEDICAL CARE  IF:  You develop back pain.  You have a fever.  You have a feeling of sickness in your stomach (nausea) or vomiting.  Your symptoms are not better in 3 days. Return sooner if you are getting worse. SEEK IMMEDIATE MEDICAL CARE IF:   You develop severe vomiting and are unable to keep the medicine down.  You develop severe back or abdominal pain despite taking your medicines.  You begin passing a large amount of blood or clots in your urine.  You feel extremely weak or faint, or you pass out. MAKE SURE YOU:   Understand these instructions.  Will watch your condition.  Will get help right away if you are not doing well or get worse.   This information is not intended to replace advice given to you by your health care provider. Make sure you discuss any questions you have with your health care provider.   Document Released: 04/04/2005 Document Revised: 04/25/2014 Document Reviewed: 12/03/2012 Elsevier Interactive Patient Education [employer].

## 2015-08-01 NOTE — Discharge Instructions (Signed)
You were seen in the Emergency Department (ED) for urinary retention.  We placed a Foley catheter to help your bladder drain.  Please read through the included information and follow up with your doctor as recommended in these papers; your doctor will see you in clinic and help you determine when it is time to have the catheter removed.  If you stop producing urine in the bag or if you develop other symptoms that concern you, such as fever, chills, persistent vomiting, or severe abdominal pain, please return immediately to the Emergency Department.

## 2015-08-01 NOTE — ED Notes (Signed)
Bladder scan showing 661m

## 2015-08-01 NOTE — ED Notes (Signed)
Patient reports urinary retention, unsure of when last has normal urine output.  States noticed hematuria just prior to coming to the ED.

## 2015-08-01 NOTE — ED Notes (Signed)
MD Eliberto Ivory made aware of bladder scan results, verbalized understanding. No further orders given at this time.

## 2015-08-01 NOTE — ED Notes (Signed)
Attempted to place 60f catheter without success.  Pt voided 400cc after attempt

## 2015-08-01 NOTE — ED Provider Notes (Signed)
[facility] Emergency Department Provider Note   ____________________________________________  Time seen: ~0435  I have reviewed the triage vital signs and the nursing notes.   HISTORY  Chief Complaint Urinary Retention and Hematuria   History limited by: Not Limited   HPI Cameron Foster. is a 75 y.o. male who presents to the emergency department today because of concerns for urinary retention. The patient states that started roughly 2 hours prior to my evaluation. The patient states that he noticed a little bit of blood. He states he was then unable to pass further urine. States this was accompanied by some lower abdominal discomfort. He denies any recent dysuria or bad odor to his urine. States he has a long history of prostate issues including prostate cancer for which she received radiation therapy. Denies any recent fevers. No chest pain shortness breath.    Past Medical History  Diagnosis Date  . Alcoholism ([place])   . Arthritis   . Prostate cancer ([place])   . Diverticulitis   . Fainting   . Arrhythmia   . Hypertension   . Hyperlipidemia   . Colon polyp   . Vertigo   . Atrial fibrillation [facility])     Patient Active Problem List   Diagnosis Date Noted  . History of prostate cancer 01/20/2015  . Skin nodule 01/20/2015  . Grief reaction 01/20/2015  . Swelling of right lower extremity 01/20/2015    Past Surgical History  Procedure Laterality Date  . Knee arthroscopy      Current Outpatient Rx  Name  Route  Sig  Dispense  Refill  . amLODipine (NORVASC) 5 MG tablet      5 mg once daily.          Marland Kitchen atenolol (TENORMIN) 25 MG tablet      25 mg once daily.          Marland Kitchen doxycycline (VIBRA-TABS) 100 MG tablet   Oral   Take 1 tablet (100 mg total) by mouth 2 (two) times daily.   14 tablet   0   . hydrocortisone 2.5 % cream   Topical   Apply topically 2 (two) times daily.   30 g   1   . lisinopril (PRINIVIL,ZESTRIL) 10 MG  tablet      10 mg once daily.          Marland Kitchen omeprazole (PRILOSEC) 40 MG capsule   Oral   Take 40 mg by mouth daily.         Marland Kitchen senna (SENOKOT) 8.6 MG tablet   Oral   Take 1 tablet by mouth daily.           Allergies Formaldehyde  Family History  Problem Relation Age of Onset  . Lung cancer      parent  . Bipolar disorder Son   . Sudden death Son     due to drug over dose and cardiac issues  . Prostate cancer Neg Hx   . Bladder Cancer Neg Hx     Social History Social History  Substance Use Topics  . Smoking status: Former Research scientist (life sciences)  . Smokeless tobacco: Current User    Types: Chew  . Alcohol Use: 0.6 oz/week    1 Standard drinks or equivalent per week    Review of Systems  Constitutional: Negative for fever. Cardiovascular: Negative for chest pain. Respiratory: Negative for shortness of breath. Gastrointestinal: Positive for suprapubic pain Genitourinary: Positive for urinary retention Neurological: Negative for headaches, focal weakness  or numbness.   10-point ROS otherwise negative.  ____________________________________________   PHYSICAL EXAM:  VITAL SIGNS: ED Triage Vitals  Enc Vitals Group     BP 08/01/15 0310 115/95 mmHg     Pulse Rate 08/01/15 0310 94     Resp 08/01/15 0310 22     Temp 08/01/15 0310 97.9 F (36.6 C)     Temp Source 08/01/15 0310 Oral     SpO2 08/01/15 0310 95 %     Weight 08/01/15 0310 208 lb (94.348 kg)     Height 08/01/15 0310 5' 9"  (1.753 m)     Head Cir --      Peak Flow --      Pain Score 08/01/15 0323 0   Constitutional: Alert and oriented. Well appearing and in no distress. Eyes: Conjunctivae are normal. PERRL. Normal extraocular movements. ENT   Head: Normocephalic and atraumatic.   Nose: No congestion/rhinnorhea.   Mouth/Throat: Mucous membranes are moist.   Neck: No stridor. Hematological/Lymphatic/Immunilogical: No cervical lymphadenopathy. Cardiovascular: Normal rate, regular rhythm.  No  murmurs, rubs, or gallops. Respiratory: Normal respiratory effort without tachypnea nor retractions. Breath sounds are clear and equal bilaterally. No wheezes/rales/rhonchi. Gastrointestinal: Soft and nontender. No distention. There is no CVA tenderness. Genitourinary: Deferred Musculoskeletal: Normal range of motion in all extremities. No joint effusions.  No lower extremity tenderness nor edema. Neurologic:  Normal speech and language. No gross focal neurologic deficits are appreciated.  Skin:  Skin is warm, dry and intact. No rash noted. Psychiatric: Mood and affect are normal. Speech and behavior are normal. Patient exhibits appropriate insight and judgment.  ____________________________________________    LABS (pertinent positives/negatives)  Labs Reviewed  URINALYSIS COMPLETEWITH MICROSCOPIC ([facility] ONLY) - Abnormal; Notable for the following:    Color, Urine RED (*)    APPearance CLOUDY (*)    Glucose, UA   (*)    Value: TEST NOT REPORTED DUE TO COLOR INTERFERENCE OF URINE PIGMENT   Bilirubin Urine   (*)    Value: TEST NOT REPORTED DUE TO COLOR INTERFERENCE OF URINE PIGMENT   Ketones, ur   (*)    Value: TEST NOT REPORTED DUE TO COLOR INTERFERENCE OF URINE PIGMENT   Hgb urine dipstick   (*)    Value: TEST NOT REPORTED DUE TO COLOR INTERFERENCE OF URINE PIGMENT   Protein, ur   (*)    Value: TEST NOT REPORTED DUE TO COLOR INTERFERENCE OF URINE PIGMENT   Nitrite   (*)    Value: TEST NOT REPORTED DUE TO COLOR INTERFERENCE OF URINE PIGMENT   Leukocytes, UA   (*)    Value: TEST NOT REPORTED DUE TO COLOR INTERFERENCE OF URINE PIGMENT   Bacteria, UA MANY (*)    All other components within normal limits  CBC WITH DIFFERENTIAL/PLATELET - Abnormal; Notable for the following:    RBC 4.18 (*)    Hemoglobin 12.3 (*)    HCT 36.7 (*)    All other components within normal limits  BASIC METABOLIC PANEL - Abnormal; Notable for the following:    Glucose, Bld 111 (*)    Creatinine, Ser 1.27  (*)    GFR calc non Af Amer 54 (*)    All other components within normal limits  URINE CULTURE    ____________________________________________   EKG  None  ____________________________________________    RADIOLOGY  None  ____________________________________________   PROCEDURES  Procedure(s) performed: None  Critical Care performed: No  ____________________________________________   INITIAL IMPRESSION / ASSESSMENT AND PLAN /  ED COURSE  Pertinent labs & imaging results that were available during my care of the patient were reviewed by me and considered in my medical decision making (see chart for details).  Patient presented to the emergency department today because of concerns for hematuria and urinary retention. Catheterization was attempted multiple times. There were unable to pass catheter likely secondary to prostate. The patient however was able to spontaneously void after that. Repeat bladder scan showed less than 150 ML's. The patient was then observed here in the emergency department and was able to void another time. Urine showed large amount of red blood cells. Patient did not have any signs of dysuria, no fever or leukocytosis. No increase of creatinine. This point I doubt infection however will send for urine culture. Discussed with patient that he must return if he starts having further urinary retention. Discussed with patient that he should follow-up with urology.  ____________________________________________   FINAL CLINICAL IMPRESSION(S) / ED DIAGNOSES  Final diagnoses:  Urinary retention  Hematuria     Nance Pear, MD 08/01/15 [phone]

## 2015-08-01 NOTE — ED Notes (Signed)
Pt given leg bag, not switched out at this time since pt will be going home and going to bed. Pt educated on proper administration of leg bag, verbalized and demonstrated proper technique. Pt stable, vitals stable, NAD noted.

## 2015-08-01 NOTE — ED Notes (Signed)
Pt urinated approx 200 cc after foley attempt

## 2015-08-02 ENCOUNTER — Encounter: Payer: Self-pay | Admitting: Family Medicine

## 2015-08-02 DIAGNOSIS — M791 Myalgia, unspecified site: Secondary | ICD-10-CM | POA: Insufficient documentation

## 2015-08-02 DIAGNOSIS — K5900 Constipation, unspecified: Secondary | ICD-10-CM | POA: Insufficient documentation

## 2015-08-02 DIAGNOSIS — J0110 Acute frontal sinusitis, unspecified: Secondary | ICD-10-CM | POA: Insufficient documentation

## 2015-08-02 LAB — URINE CULTURE: CULTURE: NO GROWTH

## 2015-08-02 NOTE — Assessment & Plan Note (Signed)
Patient with significant myalgias in bilateral lower extremities that he reports have been going on for decades though have worsened recently. He's had a negative ultrasound of his right lower extremity previously. He reports negative ABIs. He has good pulses in his feet making vascular cause less likely though the discomfort does worsen when he walks making claudication possible. He does have some muscular tenderness today on exam. No obvious medications that would cause myalgias. We will check a CK, CMP, ESR, A1c, and B12 to evaluate for myalgias and neuropathy as causes. Given the right lower extremity swelling and his history of prostate cancer we will check a PSA as well and given the unknown cause of his right lower extremity swelling will do a CT scan of his abdomen and pelvis to rule out mass lesion causing compressive issue. He will continue to monitor his symptoms. He is given return precautions.

## 2015-08-02 NOTE — Assessment & Plan Note (Signed)
Check PSA today.

## 2015-08-02 NOTE — Assessment & Plan Note (Signed)
Patient with chronic intermittent constipation likely related to IBS. Good bowel sounds. He is passing gas. Had a bowel movement yesterday. Doubt obstruction. Does have some mild left-sided abdominal discomfort and tenderness today. No peritoneal signs. Some of this discomfort could also be related to him having taken Augmentin recently and having stomach upset related to this. Discussed that we would check lab work with sedimentation rate and CRP to ensure no inflammation. If there were signs of inflammation on this would favor treating with an antibiotic to cover diverticulitis. If no significant signs of inflammation on lab work would hold off on treating diverticulitis and monitor. We'll continue current home regimen for constipation. If worsens would consider adding MiraLAX. Given return precautions.

## 2015-08-02 NOTE — Assessment & Plan Note (Signed)
Patient with sinusitis. Did not tolerate Augmentin due to GI side effects. We will await lab work prior to determining which antibiotic to put him on. If inflammatory markers are elevated will start on Avelox to provide coverage for sinusitis and diverticulitis. If inflammatory markers are not significantly elevated would start on doxycycline just to cover sinusitis. Patient will continue to monitor. Given return precautions.

## 2015-08-02 NOTE — Progress Notes (Signed)
Patient ID: [name], male   DOB: May 26, 1940, 75 y.o.   MRN: [id number]  [name], MD Phone: [phone]  Hope Pigeon Oswald Pott. is a 75 y.o. male who presents today for follow-up.   Leg pain: Patient notes that for decades his legs have hurt. It is an aching sensation. Notes this has been worsening steadily throughout the years though is worsening more steadily recently. Notes they burn when he touches them. Notes they are progressively more when he walks. Notes the right leg has been swollen for many years. He had an ultrasound last fall to evaluate this for blood clot. He reports he's had ABIs in the past that have been negative. He has no chest pain or shortness of breath with this. He has not ever had a blood clot. He does have a history of prostate cancer. He notes most of his pain is in the muscles though he does have some joint pain. He was recently more on his feet while his wife was in the hospital following surgery. His pain did not increase appreciably following this increased activity level  IBS: Patient notes he has been constipated fairly frequently. He had a bowel movement yesterday. Typically has bowel movements every other day. Takes senna and milk of magnesia intermittently. Occasionally he has some nausea. No vomiting. Lots of gas. No blood in his stool. Notes some mild left-sided abdominal discomfort over the last several days that he has had intermittently in the past. He has a history of diverticulitis. He's had no fevers. He does note he just started Augmentin for a sinus infection and could not tolerate it due to stomach discomfort.  Sinusitis: Patient was seen at the walk-in clinic with sinus pressure, nasal congestion, and rhinorrhea. Notes he was started on Augmentin though could not tolerate this and has not taken this in the last couple of days.  PMH: Former smoker   ROS see history of present illness  Objective  Physical Exam Filed Vitals:   07/30/15  0856  BP: 122/80  Pulse: 70  Temp: 98.2 F (36.8 C)    BP Readings from Last 3 Encounters:  08/01/15 132/79  08/01/15 126/74  08/01/15 153/93   Wt Readings from Last 3 Encounters:  08/01/15 208 lb (94.348 kg)  08/01/15 208 lb (94.348 kg)  08/01/15 208 lb (94.348 kg)    Physical Exam  Constitutional: He is well-developed, well-nourished, and in no distress.  HENT:  Head: Normocephalic and atraumatic.  Right Ear: External ear normal.  Left Ear: External ear normal.  Mouth/Throat: Oropharynx is clear and moist. No oropharyngeal exudate.  Eyes: Conjunctivae are normal. Pupils are equal, round, and reactive to light.  Neck: Neck supple.  Cardiovascular: Normal rate and normal heart sounds.   Pulmonary/Chest: Effort normal and breath sounds normal.  Abdominal: Soft. Bowel sounds are normal. He exhibits no distension. There is no rebound and no guarding.  Mild left-sided abdominal discomfort with palpation  Musculoskeletal:  Right lower extremity with swelling noted throughout the extremity, no cords palpated, bilateral lower extremities tender to palpation throughout the muscles, no overlying skin changes, feet are warm and well perfused with 2+ DP pulses  Lymphadenopathy:    He has no cervical adenopathy.  Neurological: He is alert.  5 out of 5 strength bilateral quads, hamstrings, plantar flexion, and dorsiflexion, sensation to light touch intact bilaterally lower extremities  Skin: Skin is warm and dry. He is not diaphoretic.     Assessment/Plan: Please see individual problem  list.  Myalgia Patient with significant myalgias in bilateral lower extremities that he reports have been going on for decades though have worsened recently. He's had a negative ultrasound of his right lower extremity previously. He reports negative ABIs. He has good pulses in his feet making vascular cause less likely though the discomfort does worsen when he walks making claudication possible. He does  have some muscular tenderness today on exam. No obvious medications that would cause myalgias. We will check a CK, CMP, ESR, A1c, and B12 to evaluate for myalgias and neuropathy as causes. Given the right lower extremity swelling and his history of prostate cancer we will check a PSA as well and given the unknown cause of his right lower extremity swelling will do a CT scan of his abdomen and pelvis to rule out mass lesion causing compressive issue. He will continue to monitor his symptoms. He is given return precautions.  History of prostate cancer Check PSA today.  Constipation Patient with chronic intermittent constipation likely related to IBS. Good bowel sounds. He is passing gas. Had a bowel movement yesterday. Doubt obstruction. Does have some mild left-sided abdominal discomfort and tenderness today. No peritoneal signs. Some of this discomfort could also be related to him having taken Augmentin recently and having stomach upset related to this. Discussed that we would check lab work with sedimentation rate and CRP to ensure no inflammation. If there were signs of inflammation on this would favor treating with an antibiotic to cover diverticulitis. If no significant signs of inflammation on lab work would hold off on treating diverticulitis and monitor. We'll continue current home regimen for constipation. If worsens would consider adding MiraLAX. Given return precautions.  Sinusitis, acute frontal Patient with sinusitis. Did not tolerate Augmentin due to GI side effects. We will await lab work prior to determining which antibiotic to put him on. If inflammatory markers are elevated will start on Avelox to provide coverage for sinusitis and diverticulitis. If inflammatory markers are not significantly elevated would start on doxycycline just to cover sinusitis. Patient will continue to monitor. Given return precautions.    Orders Placed This Encounter  Procedures  . CT Abdomen Pelvis W  Contrast    Standing Status: Future     Number of Occurrences:      Standing Expiration Date: 10/28/2016    Order Specific Question:  If indicated for the ordered procedure, I authorize the administration of contrast media per Radiology protocol    Answer:  Yes    Order Specific Question:  Reason for Exam (SYMPTOM  OR DIAGNOSIS REQUIRED)    Answer:  lower extremity swelling right leg, abdominal discomfort, history of prostate cancer    Order Specific Question:  Preferred imaging location?    Answer:  [facility]  . Comp Met (CMET)  . CK (Creatine Kinase)  . Sed Rate (ESR)  . C-reactive protein  . B12  . HgB A1c  . PSA    [name], MD [place]

## 2015-08-03 ENCOUNTER — Ambulatory Visit (INDEPENDENT_AMBULATORY_CARE_PROVIDER_SITE_OTHER): Payer: Medicare Other | Admitting: Family Medicine

## 2015-08-03 ENCOUNTER — Ambulatory Visit: Payer: PRIVATE HEALTH INSURANCE | Admitting: Family Medicine

## 2015-08-03 ENCOUNTER — Telehealth: Payer: Self-pay | Admitting: Family Medicine

## 2015-08-03 ENCOUNTER — Encounter: Payer: Self-pay | Admitting: Family Medicine

## 2015-08-03 VITALS — BP 106/64 | HR 82 | Temp 98.4°F | Ht 69.0 in | Wt 204.6 lb

## 2015-08-03 DIAGNOSIS — K5900 Constipation, unspecified: Secondary | ICD-10-CM

## 2015-08-03 DIAGNOSIS — R339 Retention of urine, unspecified: Secondary | ICD-10-CM

## 2015-08-03 DIAGNOSIS — M791 Myalgia, unspecified site: Secondary | ICD-10-CM

## 2015-08-03 DIAGNOSIS — R748 Abnormal levels of other serum enzymes: Secondary | ICD-10-CM | POA: Diagnosis not present

## 2015-08-03 DIAGNOSIS — R109 Unspecified abdominal pain: Secondary | ICD-10-CM | POA: Diagnosis not present

## 2015-08-03 LAB — CBC
HCT: 35.3 % — ABNORMAL LOW (ref 38.5–50.0)
Hemoglobin: 11.6 g/dL — ABNORMAL LOW (ref 13.2–17.1)
MCH: 28.5 pg (ref 27.0–33.0)
MCHC: 32.9 g/dL (ref 32.0–36.0)
MCV: 86.7 fL (ref 80.0–100.0)
MPV: 9.9 fL (ref 7.5–12.5)
Platelets: 280 10*3/uL (ref 140–400)
RBC: 4.07 MIL/uL — AB (ref 4.20–5.80)
RDW: 13.6 % (ref 11.0–15.0)
WBC: 11 10*3/uL — ABNORMAL HIGH (ref 3.8–10.8)

## 2015-08-03 LAB — URINE CULTURE: CULTURE: NO GROWTH

## 2015-08-03 MED ORDER — POLYETHYLENE GLYCOL 3350 17 GM/SCOOP PO POWD: 17.0000 g | Freq: Two times a day (BID) | ORAL | Status: DC | PRN

## 2015-08-03 NOTE — Op Note (Signed)
NAME:  Cameron Foster, Cameron Foster                     ACCOUNT NO.:  MEDICAL RECORD NO.:  [id number]  LOCATION:                                 FACILITY:  PHYSICIAN:  Marshall Cork. Jeffie Pollock, M.D.    DATE OF BIRTH:  1940-06-02  DATE OF PROCEDURE:  08/01/2015 DATE OF DISCHARGE:                              OPERATIVE REPORT   Patient of Dr. Charlotte Crumb.  PROCEDURE PERFORMED:  Cystoscopy with urethral dilation, complex Foley placement, and evacuation of clots.  SURGEON:  Marshall Cork. Jeffie Pollock, M.D.  PREOPERATIVE DIAGNOSIS:  Clot retention.  POSTOPERATIVE DIAGNOSIS:  Clot retention.  SURGEON:  Marshall Cork. Jeffie Pollock, MD  ANESTHESIA:  Local.  DRAIN:  20-French Council catheter.  BLOOD LOSS:  Minimal.  COMPLICATIONS:  None.  INDICATIONS:  Cameron Foster is a 75 year old [name] with a history of Gleason 7 prostate cancer, treated in 2012 with radiation therapy. Prior to that, he had transurethral microwave thermotherapy.  He has been followed by Dr. his most recent PSA remained quite low.  He did have some slowing of the urinary stream, but last night, he came to the emergency room, unable to void and attempted catheterization was performed and it was unsuccessful, but he was eventually able to void. He was sent home and another attempt at catheterization was unsuccessful.  I was called for further evaluation.  FINDINGS OF PROCEDURE:  He had actually voided prior to my arrival, but had passed some clots and still had a residual greater than 200 mL.  I attempted to pass an 18-French Coude Foley catheter after generous urethral lubrication, he had previously received lidocaine jelly and a Betadine prep.  The catheter felt like it made it to the prostate, but would not pass further.  There was minimal blood on the catheter upon removal.  At this point, an attempt was made to pass a wire to the bladder.  I got the wire in, I was not sure I was in the bladder, I passed the small dilator, but met resistance so I  did not press the issue.  At this point, a flexible cystoscope was engaged, this was inserted. The urethra was unremarkable.  The external sphincter was intact.  The prostatic urethra had some blanching of the mucosa with apical adhesions between the lateral lobes that is likely the consequence of his prior microwave therapy and radiation.  I was able to negotiate to scope up and over the adhesed middle lobe into the bladder.  He did have a proximal microwave defect.  Inspection of bladder was difficult due to bloody urine.  There was a ball of clot present, but I did not see what appeared to be an active bleeder.  I could not assess the mucosa thoroughly.  The ureteral orifices were not visualized.  At this point, a guidewire was passed through the scope into the bladder and the scope was removed.  The urethra was calibrated to 24-French with a plastic slip over sound and a 20-French Council catheter was inserted with some resistance into the bladder.  Once in position, there was urine noted to flow from the hub.  The balloon was filled with  10 mL of sterile fluid and the wire was removed.  The catheter was then hand irrigated with a Toomey syringe with return of approximately 20 mL of old clot.  Once the clots had been removed, the irrigant began return at most blush pink.  The catheter was then placed to straight drainage.  The patient was discharged home with the catheter to drainage.  I will arrange followup for him in the [facility] on Tuesday for a voiding trial.  He may need subsequent TURP for the adhesions.     Marshall Cork. Jeffie Pollock, M.D.     JJW/MEDQ  D:  08/01/2015  T:  08/01/2015  Job:  [id number]

## 2015-08-03 NOTE — Telephone Encounter (Signed)
Spoke  With patient and he is in a lot of pain. He has not had a bowel movement in 4-5 days. He tried a suppository last night without any relief. He has appointment at 3:30 today, but not sure if he will be back from [facility] in time. He wants to know if you can send something in.

## 2015-08-03 NOTE — Telephone Encounter (Signed)
Patient not able to make appointment today

## 2015-08-03 NOTE — Telephone Encounter (Signed)
Patient coming in today for appointment

## 2015-08-03 NOTE — Telephone Encounter (Signed)
Noted. Will plan on seeing patient in the office. Thanks.

## 2015-08-03 NOTE — Assessment & Plan Note (Signed)
Improving. We'll repeat lab work today.

## 2015-08-03 NOTE — Assessment & Plan Note (Signed)
Patient with urinary retention over the weekend. Was seen in the emergency room and evaluated by urology. Has follow-up with them tomorrow. He is able to pass urine at this time. No catheter in place. He will keep his urology appointment.

## 2015-08-03 NOTE — Progress Notes (Signed)
Pre visit review using our clinic review tool, if applicable. No additional management support is needed unless otherwise documented below in the visit note. 

## 2015-08-03 NOTE — Telephone Encounter (Signed)
Pt stated that he was in the ER this weekend and was told that he will need to have surgery. Pt has appt with [facility] tomorrow. Pt has scheduled an appt for this afternoon but request to speak with Dr. Caryl Bis.msn

## 2015-08-03 NOTE — Telephone Encounter (Signed)
Can you make sure he is passing gas and see if he is having nausea or vomiting? Otherwise, we will wait and see if the patient can make his appointment this afternoon if he cannot we will discuss medication for constipation.

## 2015-08-03 NOTE — Patient Instructions (Signed)
Nice to see you. Your stomach discomfort is likely related to constipation. Please keep your appointment with the urologist tomorrow. We will obtain some lab work today. If you develop vomiting or blood in your stool, worsening nausea, worsening abdominal discomfort, inability to pass gas, or any new or change in symptoms please seek medical attention.

## 2015-08-03 NOTE — Assessment & Plan Note (Signed)
Patient with history of chronic intermittent constipation. Last bowel movement 4-5 days ago. Good bowel sounds. He is passing gas. Doubt obstruction at this time. Suspect his abdominal discomfort and mild tenderness is likely related to constipation. He had lab work that did not reveal an appreciably elevated inflammatory marker and had lab work over the weekend that did not reveal an elevated Schiffman count making diverticulitis unlikely cause. We will treat him for constipation with MiraLAX. He will trial this for 1-2 days and then if not improving try milk of magnesia. Advised that if he were to develop any vomiting, worsening nausea, worsening abdominal pain, or inability to pass gas he should go to the emergency room. He is given return precautions.

## 2015-08-03 NOTE — Progress Notes (Signed)
Patient ID: Cameron Perna., male   DOB: [birth year], 75 y.o.   MRN: [id number]  [name], MD Phone: [phone]  Cameron Foster. is a 75 y.o. male who presents today for same-day visit.  Constipation: Patient notes he has not had a bowel movement in 4-5 days. Notes he is passing gas. Notes the discomfort in his abdomen that feels like hunger pains. States is over the lower abdomen. No fevers. No vomiting. No diarrhea. Mild nausea. Notes he took a suppository with no benefit. Has used milk of magnesia in the past with some benefit. Also notes he was seen in the emergency room several times over the weekend for prostate issue and urinary retention. They were unable to get a urinary catheter in place. Notes he is able to urinate at this time. He sees urology tomorrow. He also notes that his legs still hurt though they are significantly improved from the last time we saw each other.  PMH: Former smoker   ROS see history of present illness  Objective  Physical Exam Filed Vitals:   08/03/15 1615  BP: 106/64  Pulse: 82  Temp: 98.4 F (36.9 C)    BP Readings from Last 3 Encounters:  08/03/15 106/64  08/01/15 132/79  08/01/15 126/74   Wt Readings from Last 3 Encounters:  08/03/15 204 lb 9.6 oz (92.806 kg)  08/01/15 208 lb (94.348 kg)  08/01/15 208 lb (94.348 kg)    Physical Exam  Constitutional: He is well-developed, well-nourished, and in no distress.  HENT:  Head: Normocephalic and atraumatic.  Right Ear: External ear normal.  Left Ear: External ear normal.  Cardiovascular: Normal rate, regular rhythm and normal heart sounds.   Pulmonary/Chest: Effort normal and breath sounds normal.  Abdominal: Soft. Bowel sounds are normal. He exhibits no distension. There is tenderness (Mild left-sided abdominal discomfort on palpation). There is no rebound and no guarding.  Musculoskeletal:  No tenderness of bilateral legs  Neurological: He is alert. Gait normal.  Skin: Skin  is warm and dry. He is not diaphoretic.     Assessment/Plan: Please see individual problem list.  Constipation Patient with history of chronic intermittent constipation. Last bowel movement 4-5 days ago. Good bowel sounds. He is passing gas. Doubt obstruction at this time. Suspect his abdominal discomfort and mild tenderness is likely related to constipation. He had lab work that did not reveal an appreciably elevated inflammatory marker and had lab work over the weekend that did not reveal an elevated Patino count making diverticulitis unlikely cause. We will treat him for constipation with MiraLAX. He will trial this for 1-2 days and then if not improving try milk of magnesia. Advised that if he were to develop any vomiting, worsening nausea, worsening abdominal pain, or inability to pass gas he should go to the emergency room. He is given return precautions.  Myalgia Improving. We'll repeat lab work today.  Urinary retention Patient with urinary retention over the weekend. Was seen in the emergency room and evaluated by urology. Has follow-up with them tomorrow. He is able to pass urine at this time. No catheter in place. He will keep his urology appointment.    Orders Placed This Encounter  Procedures  . CK (Creatine Kinase)  . Comp Met (CMET)  . CBC    Meds ordered this encounter  Medications  . polyethylene glycol powder (GLYCOLAX/MIRALAX) powder    Sig: Take 17 g by mouth 2 (two) times daily as needed.    Dispense:  3350 g    Refill:  [place], MD [place]

## 2015-08-03 NOTE — Telephone Encounter (Signed)
Spoke with patient again and is passing some gas, but as much as he has in the past. He does have nausea, but no vomiting.

## 2015-08-04 ENCOUNTER — Other Ambulatory Visit
Admission: RE | Admit: 2015-08-04 | Discharge: 2015-08-04 | Disposition: A | Discharge status: Home / Self Care | Payer: Medicare Other | Source: Ambulatory Visit | Attending: Family Medicine | Admitting: Family Medicine

## 2015-08-04 ENCOUNTER — Other Ambulatory Visit: Payer: Self-pay | Admitting: Family Medicine

## 2015-08-04 ENCOUNTER — Encounter: Payer: Self-pay | Admitting: Urology

## 2015-08-04 ENCOUNTER — Ambulatory Visit (INDEPENDENT_AMBULATORY_CARE_PROVIDER_SITE_OTHER): Payer: Medicare Other | Admitting: Urology

## 2015-08-04 VITALS — BP 122/74 | HR 80 | Ht 69.0 in | Wt 205.0 lb

## 2015-08-04 DIAGNOSIS — N179 Acute kidney failure, unspecified: Secondary | ICD-10-CM

## 2015-08-04 DIAGNOSIS — Z8546 Personal history of malignant neoplasm of prostate: Secondary | ICD-10-CM | POA: Diagnosis not present

## 2015-08-04 DIAGNOSIS — N5235 Erectile dysfunction following radiation therapy: Secondary | ICD-10-CM

## 2015-08-04 DIAGNOSIS — R310 Gross hematuria: Secondary | ICD-10-CM | POA: Diagnosis not present

## 2015-08-04 DIAGNOSIS — R339 Retention of urine, unspecified: Secondary | ICD-10-CM | POA: Diagnosis not present

## 2015-08-04 LAB — URINALYSIS, COMPLETE
BILIRUBIN UA: NEGATIVE
Glucose, UA: NEGATIVE
Leukocytes, UA: NEGATIVE
Nitrite, UA: NEGATIVE
PH UA: 5 (ref 5.0–7.5)
UUROB: 0.2 mg/dL (ref 0.2–1.0)

## 2015-08-04 LAB — COMPREHENSIVE METABOLIC PANEL
ALBUMIN: 3.9 g/dL (ref 3.6–5.1)
ALT: 14 U/L (ref 9–46)
AST: 21 U/L (ref 10–35)
Alkaline Phosphatase: 55 U/L (ref 40–115)
BILIRUBIN TOTAL: 0.6 mg/dL (ref 0.2–1.2)
BUN: 21 mg/dL (ref 7–25)
CALCIUM: 9.3 mg/dL (ref 8.6–10.3)
CO2: 26 mmol/L (ref 20–31)
CREATININE: 1.41 mg/dL — AB (ref 0.70–1.18)
Chloride: 103 mmol/L (ref 98–110)
Glucose, Bld: 85 mg/dL (ref 65–99)
Potassium: 4 mmol/L (ref 3.5–5.3)
SODIUM: 138 mmol/L (ref 135–146)
TOTAL PROTEIN: 6.8 g/dL (ref 6.1–8.1)

## 2015-08-04 LAB — CREATININE, SERUM
Creatinine, Ser: 1.27 mg/dL — ABNORMAL HIGH (ref 0.61–1.24)
GFR calc non Af Amer: 54 mL/min — ABNORMAL LOW (ref >60)

## 2015-08-04 LAB — CK: Total CK: 368 U/L — ABNORMAL HIGH (ref 7–232)

## 2015-08-04 LAB — MICROSCOPIC EXAMINATION
BACTERIA UA: NONE SEEN
RBC, UA: >30 /hpf — AB (ref 0–?)

## 2015-08-04 LAB — BLADDER SCAN AMB NON-IMAGING

## 2015-08-04 NOTE — Progress Notes (Signed)
02/12/2015 2:59 PM   Cameron Foster 11-28-1940 [id number]  Referring provider: Leone Haven, MD [address] [place], [place] [address]  Chief Complaint  Patient presents with  . Prostate Cancer    New Patient    HPI:  1 - Moderate Risk Prostate Cancer - s/p external beam + 21mo andogen deprivation for Gleason 7 disease 2012. Recent Surveillance: 07/2015 PSA 0.05  2 - Erectile Dysfunction - inability to achieve erection x years. Used to use PDE5i but now minimaly sexually active and not big priority.  3 - Lower Urinary Tract Symptoms - long h/o mix of obstrutive and irritative symptoms even pre-radiation therapy. PVR 07/2015 "327m / normal today.   4 - Gross Hematuria / Clot Retention - new gross hematuria / retention 07/2015 prompting bedside cysto / clot evacuation by WrJeffie PollockNO large bladder masses, but cysto done at time of large volume clot. Has CT pending tomorrow.  Today " Cameron Foster is seen in f/u above. He had catheter from hospital removed and remain retention free. He still c/o some obsrucctive symptoms, but at baseline.    PMH: Past Medical History  Diagnosis Date  . Alcoholism ([facility]  . Arthritis   . Prostate cancer ([facility]  . Diverticulitis   . Fainting   . Arrhythmia   . Hypertension   . Hyperlipidemia   . Colon polyp   . Vertigo   . [facility]rial fibrillation ([facility]    Surgical History: Past Surgical History  Procedure Laterality Date  . Knee arthroscopy      Home Medications:    Medication List       This list is accurate as of: 02/12/15  2:59 PM.  Always use your most recent med list.               amLODipine 5 MG tablet  Commonly known as:  NORVASC  5 mg once daily.     atenolol 25 MG tablet  Commonly known as:  TENORMIN  25 mg once daily.     lisinopril 10 MG tablet  Commonly known as:  PRINIVIL,ZESTRIL  10 mg once daily.     omeprazole 40 MG capsule  Commonly known as:  PRILOSEC  Take 40 mg by mouth daily.          Allergies:  Allergies  Allergen Reactions  . Formaldehyde Other (See Comments)    Family History: Family History  Problem Relation Age of Onset  . Lung cancer      parent  . Bipolar disorder Son   . Sudden death Son     due to drug over dose and cardiac issues  . Prostate cancer Neg Hx   . Bladder Cancer Neg Hx     Social History:  reports that he has quit smoking. His smokeless tobacco use includes Chew. He reports that he drinks about 0.6 oz of alcohol per week. He reports that he does not use illicit drugs.  ROS: UROLOGY Frequent Urination?: Yes Hard to postpone urination?: No Burning/pain with urination?: No Get up at night to urinate?: Yes Leakage of urine?: No Urine stream starts and stops?: No Trouble starting stream?: No Do you have to strain to urinate?: No Blood in urine?: No Urinary tract infection?: No Sexually transmitted disease?: No Injury to kidneys or bladder?: No Painful intercourse?: No Weak stream?: No Erection problems?: Yes Penile pain?: No  Gastrointestinal Nausea?: No Vomiting?: No Indigestion/heartburn?: Yes Diarrhea?: No Constipation?: Yes  Constitutional  Fever: No Night sweats?: No Weight loss?: No Fatigue?: Yes  Skin Skin rash/lesions?: No Itching?: Yes  Eyes Blurred vision?: No Double vision?: No  Ears/Nose/Throat Sore throat?: No Sinus problems?: Yes  Hematologic/Lymphatic Swollen glands?: No Easy bruising?: No  Cardiovascular Leg swelling?: Yes Chest pain?: No  Respiratory Cough?: No Shortness of breath?: Yes  Endocrine Excessive thirst?: No  Musculoskeletal Back pain?: No Joint pain?: Yes  Neurological Headaches?: No Dizziness?: Yes  Psychologic Depression?: No Anxiety?: No  Physical Exam: BP 132/82 mmHg  Pulse 92  Ht 5' 9"  (1.753 m)  Wt 207 lb 9.6 oz (94.167 kg)  BMI 30.64 kg/m2  Constitutional:  Alert and oriented, No acute distress. HEENT: [place] AT, moist mucus membranes.  Trachea  midline, no masses. Cardiovascular: No clubbing, cyanosis, or edema. Respiratory: Normal respiratory effort, no increased work of breathing. GI: Abdomen is soft, nontender, nondistended, no abdominal masses GU: No CVA tenderness. Normal phallus. Testicles ascended the equally bilaterally. No masses. Nontender palpation. DRE: 1+. Smooth no nodules. Skin: No rashes, bruises or suspicious lesions. Lymph: No cervical or inguinal adenopathy. Neurologic: Grossly intact, no focal deficits, moving all 4 extremities. Psychiatric: Normal mood and affect.  Laboratory Data: Lab Results  Component Value Date   WBC 8.2 01/20/2015   HGB 12.9* 01/20/2015   HCT 38.8* 01/20/2015   MCV 88.8 01/20/2015   PLT 283.0 01/20/2015    Lab Results  Component Value Date   CREATININE 1.07 01/20/2015    Lab Results  Component Value Date   PSA 0.09* 01/20/2015    No results found for: TESTOSTERONE  No results found for: HGBA1C  Urinalysis No results found for: COLORURINE, APPEARANCEUR, LABSPEC, PHURINE, GLUCOSEU, HGBUR, BILIRUBINUR, KETONESUR, PROTEINUR, UROBILINOGEN, NITRITE, LEUKOCYTESUR   Assessment & Plan:    1 - Moderate Risk Prostate Cancer - PSA kinetics suggests excellent control.   2 - Erectile Dysfunction - observe as per pt request.  3 - Lower Urinary Tract Symptoms - likely some element outlet obstruction compounded by likely radiation cystitis / prostatitis. Fortunatley PVR normal today. Rec cysto on return for this and to complete heamturia eval to r/o bladder neck conracture or other.   4 - Gross Hematuria / Clot Retention - most concerning issue today. Rec complete full eval to r/o stones / malignancy / large radiation cystitis lesions. He has CT tomorrow. Still rec office cysto on return as will be much better visualizatoin of lower tract since gross blood resolevd at present. Offered cysto today but he declines as "still very raw" from multiple ER foleys last week. This is  understnadable.  5 - RTC as scheudled next week for cysto with CT in interval.    [facility] [address] [place],  [address] [phone]

## 2015-08-05 ENCOUNTER — Encounter: Payer: Self-pay | Admitting: Family Medicine

## 2015-08-05 ENCOUNTER — Telehealth: Payer: Self-pay | Admitting: Family Medicine

## 2015-08-05 ENCOUNTER — Ambulatory Visit
Admission: RE | Admit: 2015-08-05 | Discharge: 2015-08-05 | Disposition: A | Discharge status: Home / Self Care | Payer: Medicare Other | Source: Ambulatory Visit | Attending: Family Medicine | Admitting: Family Medicine

## 2015-08-05 DIAGNOSIS — N281 Cyst of kidney, acquired: Secondary | ICD-10-CM | POA: Insufficient documentation

## 2015-08-05 DIAGNOSIS — K5730 Diverticulosis of large intestine without perforation or abscess without bleeding: Secondary | ICD-10-CM | POA: Insufficient documentation

## 2015-08-05 DIAGNOSIS — Z9889 Other specified postprocedural states: Secondary | ICD-10-CM | POA: Diagnosis not present

## 2015-08-05 DIAGNOSIS — N329 Bladder disorder, unspecified: Secondary | ICD-10-CM | POA: Diagnosis not present

## 2015-08-05 DIAGNOSIS — R1030 Lower abdominal pain, unspecified: Secondary | ICD-10-CM

## 2015-08-05 DIAGNOSIS — J9811 Atelectasis: Secondary | ICD-10-CM | POA: Insufficient documentation

## 2015-08-05 DIAGNOSIS — K5732 Diverticulitis of large intestine without perforation or abscess without bleeding: Secondary | ICD-10-CM

## 2015-08-05 DIAGNOSIS — K5900 Constipation, unspecified: Secondary | ICD-10-CM | POA: Diagnosis not present

## 2015-08-05 DIAGNOSIS — R319 Hematuria, unspecified: Secondary | ICD-10-CM | POA: Diagnosis not present

## 2015-08-05 IMAGING — CT CT ABD-PELV W/ CM
1 of 3 series · 13 of 32 positions shown, 18 images · IV contrast (APPLIED)
Comparison: None

CLINICAL DATA: LEFT lower quadrant pain and gross hematuria for 1
week, early satiety, constipation, history irritable bowel syndrome,
diverticulitis, prostate cancer post radiation therapy,
hypertension, atrial fibrillation, former smoker

EXAM:
CT ABDOMEN AND PELVIS WITH CONTRAST
TECHNIQUE: Multidetector CT imaging of the abdomen and pelvis was performed
using the standard protocol following bolus administration of
intravenous contrast. Sagittal and coronal MPR images reconstructed
from axial data set.
CONTRAST:  85mL [R3] IOPAMIDOL ([R3]) INJECTION 61% IV.
Dilute oral contrast.

[Series 2: axial st · axial · 0.83mm/px · z∈[-1055,-635]mm · 13 of 96 slices shown, 18 images]
[im 6/96  soft-tissue]
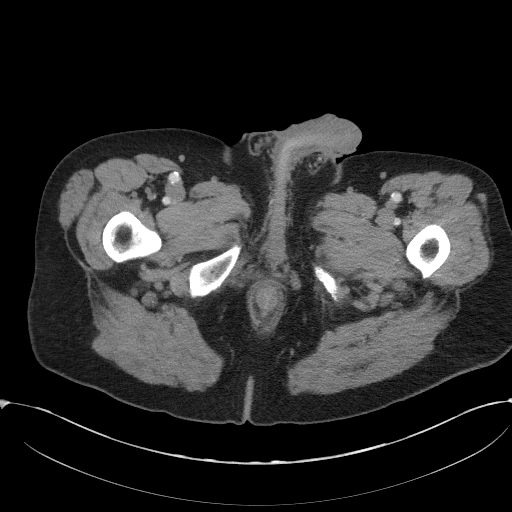
[im 6/96  bone]
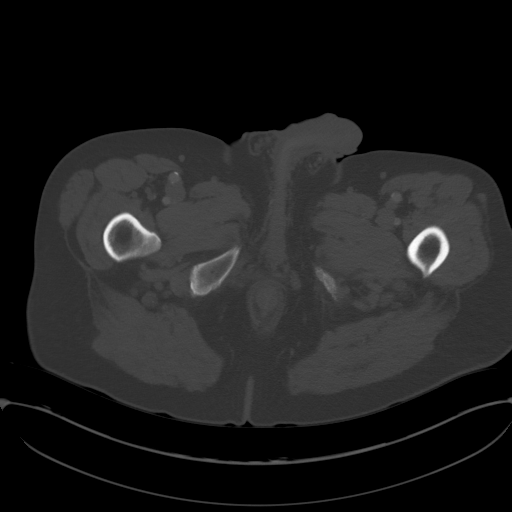
[im 16/96  soft-tissue]
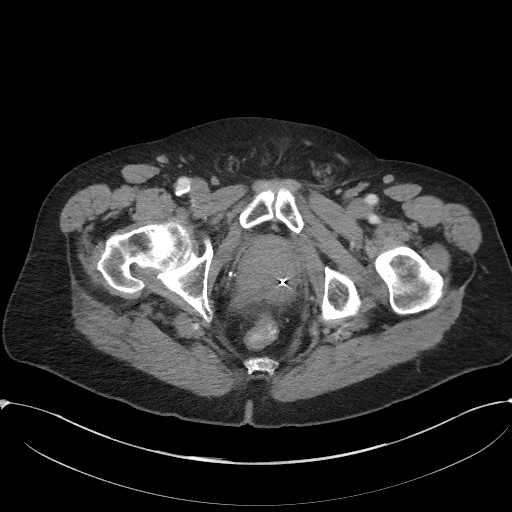
[im 22/96  soft-tissue]
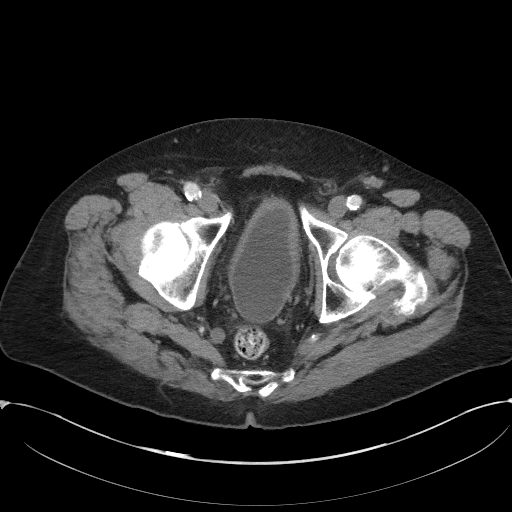
[im 27/96  soft-tissue]
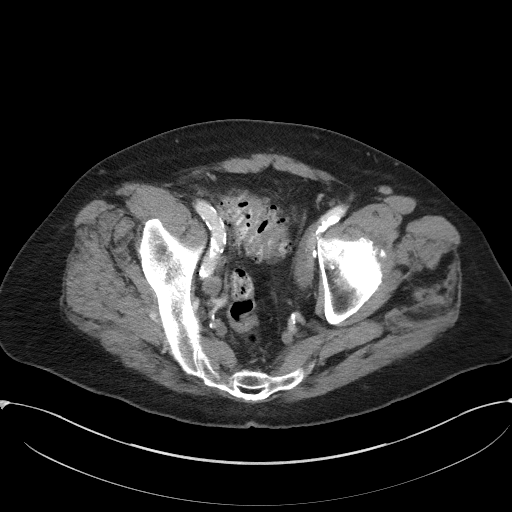
[im 37/96  soft-tissue]
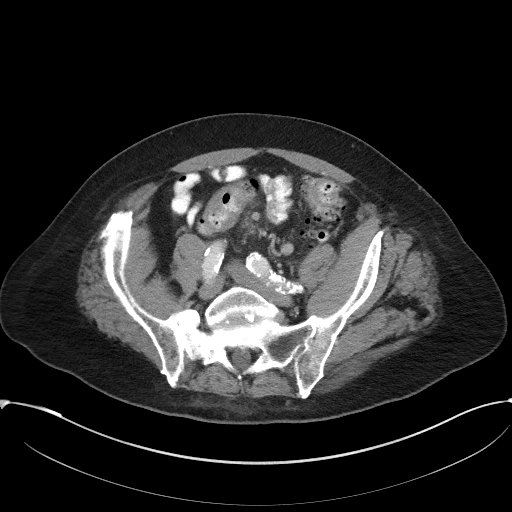
[im 43/96  soft-tissue]
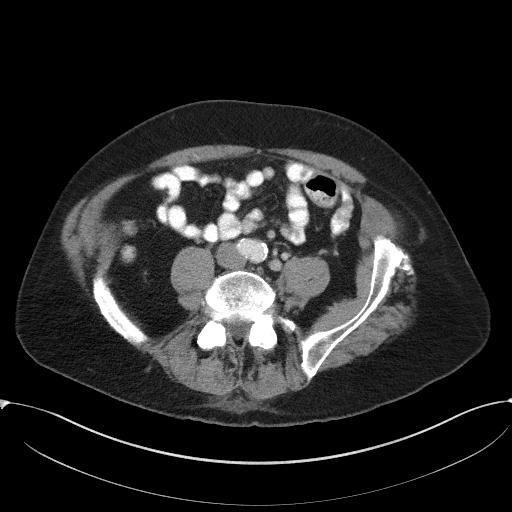
[im 53/96  soft-tissue]
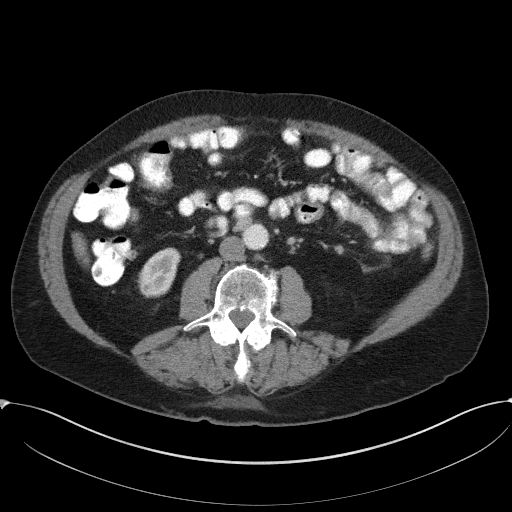
[im 59/96  soft-tissue]
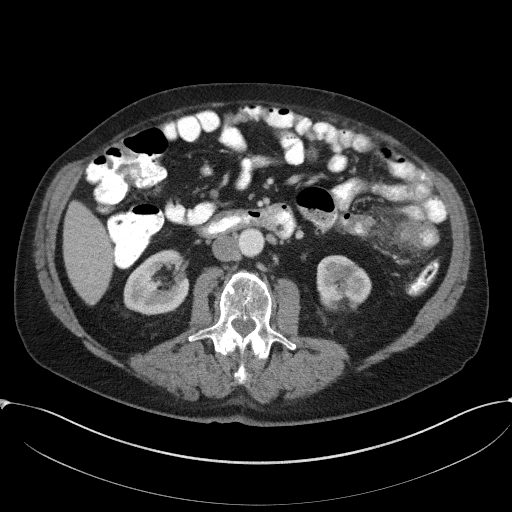
[im 69/96  soft-tissue]
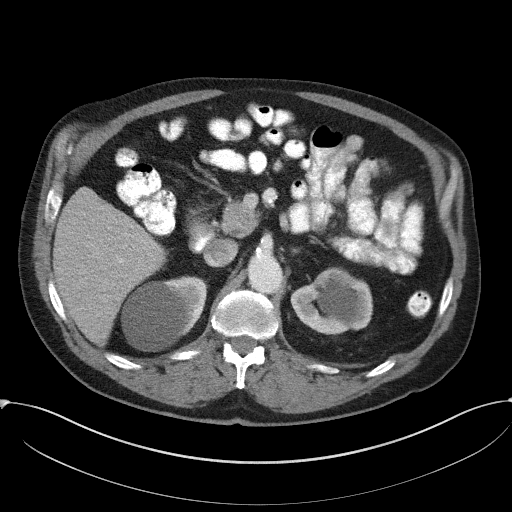
[im 69/96  bone]
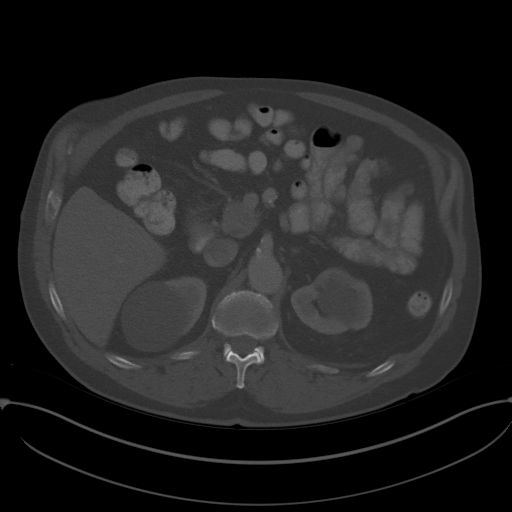
[im 74/96  soft-tissue]
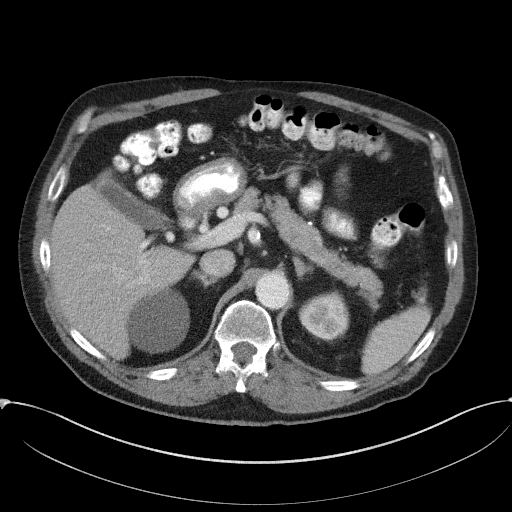
[im 74/96  lung]
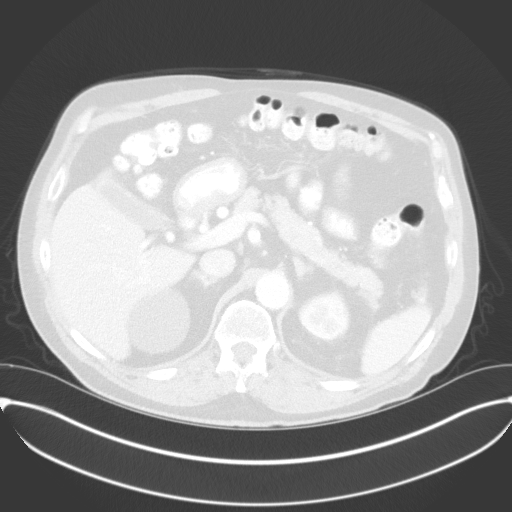
[im 80/96  soft-tissue]
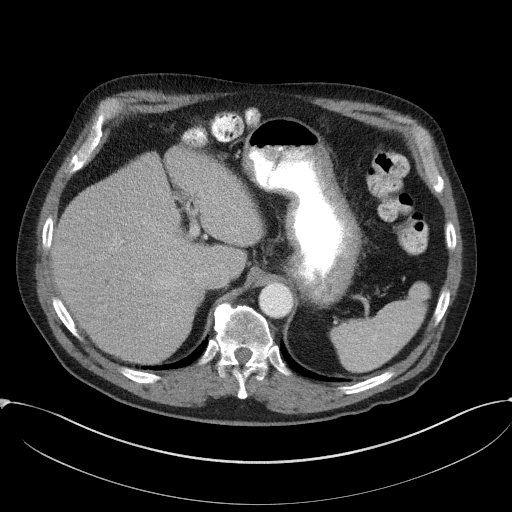
[im 80/96  lung]
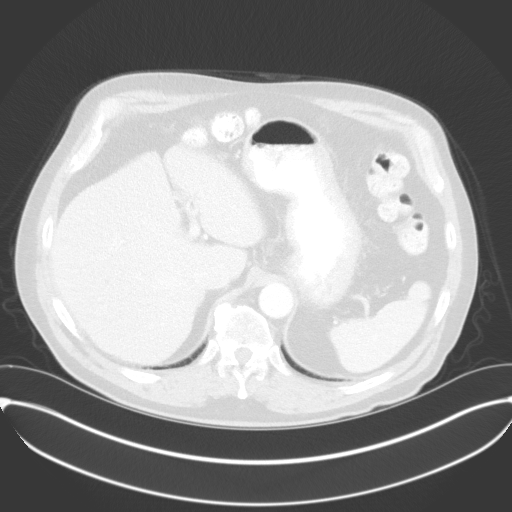
[im 85/96  lung]
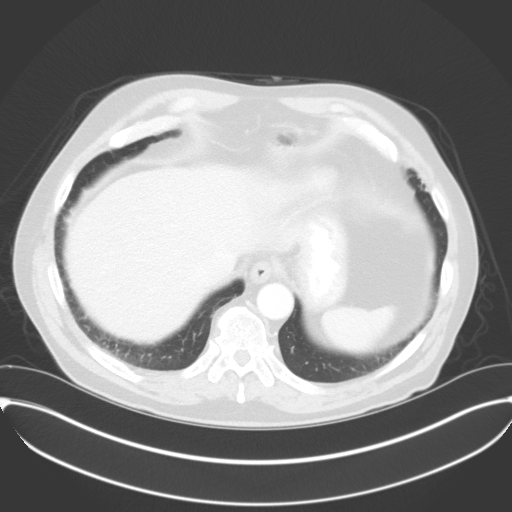
[im 90/96  soft-tissue]
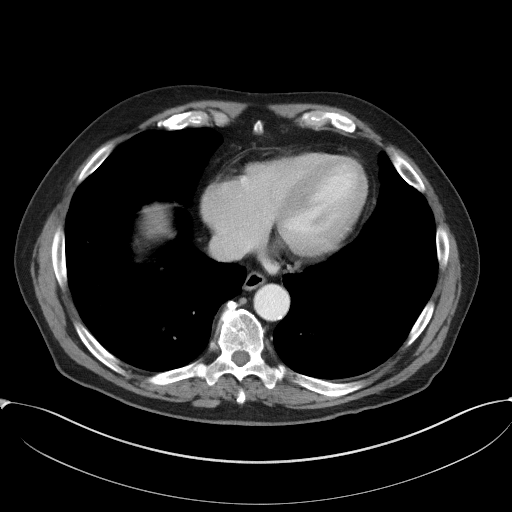
[im 90/96  lung]
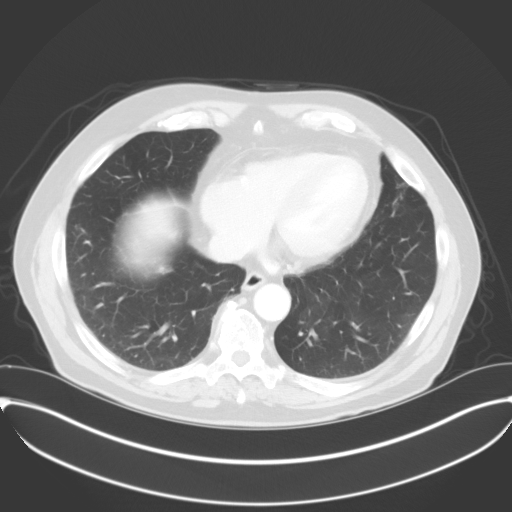

[13 of 32 positions shown; findings below may reference images not displayed]

FINDINGS: Minimal bibasilar atelectasis.

BILATERAL renal cysts measuring up to 6.1 x 5.6 cm RIGHT upper pole
image 26 and 4.7 x 3.4 cm mid LEFT kidney image 30.

Liver, gallbladder, spleen, pancreas, kidneys, and adrenal glands
otherwise unremarkable.

No urinary tract calcification, hydronephrosis or ureteral
dilatation.

Mild bladder wall thickening.

Patient has a history of prostate surgery, with several surgical
clips identified at prostate gland, though there appears to be
residual prostate tissue which is prominent in size.

Extensive colonic diverticulosis greatest at sigmoid colon.

Scattered colonic wall thickening at descending colon and sigmoid
colon with pericolic inflammatory changes at the descending colon
and at the mid sigmoid colon compatible with diverticulitis.

Stomach and bowel loops otherwise normal appearance.

Normal appendix.

Multiple normal sized mesenteric nodes especially in sigmoid region,
likely reactive.

No mass, adenopathy, if free air, free fluid, abscess, or hernia.

Scattered atherosclerotic calcifications.

No acute osseous findings.
IMPRESSION: Extensive colonic diverticulosis with foci wall thickening and
pericolic inflammatory changes at the descending and sigmoid colon
consistent with acute diverticulitis.

No definite evidence of abscess or perforation.

Mild nonspecific bladder wall thickening, can be seen with chronic
outlet obstruction, infection, tumor not completely excluded with
this appearance.

Prior prostate surgery with question mild enlargement of residual
prostate tissue.

BILATERAL renal cysts.

## 2015-08-05 MED ORDER — IOPAMIDOL (ISOVUE-300) INJECTION 61%
85.0000 mL | Freq: Once | INTRAVENOUS | Status: AC | PRN
  Administered 2015-08-05: 85 mL via INTRAVENOUS

## 2015-08-05 MED ORDER — MOXIFLOXACIN HCL 400 MG PO TABS: 400.0000 mg | ORAL_TABLET | Freq: Every day | ORAL | Status: DC

## 2015-08-05 NOTE — Telephone Encounter (Signed)
Dr Caryl Bis spoke with patient

## 2015-08-05 NOTE — Telephone Encounter (Signed)
I called pt about his CT that is scheduled this morning. He wants to let Dr. Caryl Bis know that his right leg is no longer swollen, the muscles are still sore. He doesn't know what is/has happened to his leg but he feels it is something to do with his bladder. Please advise. Does he need to be seen for this problem with Dr. Caryl Bis or does he need to follow up with his urologist?

## 2015-08-05 NOTE — Telephone Encounter (Signed)
LM for patient that I have scheduled him for Friday at [facility] . If he can not do this to give the office a call to reschedule.

## 2015-08-05 NOTE — Telephone Encounter (Signed)
Do we need to schedule patient

## 2015-08-05 NOTE — Telephone Encounter (Signed)
He needs to complete the CT scan then we will re-evaluate. He will continue to follow with urology for his bladder.

## 2015-08-05 NOTE — Telephone Encounter (Signed)
Called and spoke with patient regarding CT scan results. Advised that it revealed diverticulitis. This is likely the cause of his discomfort. He notes he is tolerating more food today. He is passing his bowels. Pain is improved and is more of a churning at this time. No vomiting or nausea. He is taking in liquids okay. He reports that his sinus issues have not improved significantly on the doxycycline. He was previously on Augmentin did not tolerate this due to diarrhea and abdominal discomfort. He also reports he has had 15 episodes of diverticulitis. Given CT scan findings we will treat him for diverticulitis. Given that he has had sinusitis symptoms that have not responded that well to doxycycline and we will switch him to Avelox to cover for diverticulitis and sinusitis. We will get him set up with a surgeon for evaluation given his recurrent diverticulitis. He will continue fluid intake. He will monitor for worsening symptoms. He was advised to return precautions. I'll see him back in the office on Friday for recheck.

## 2015-08-06 ENCOUNTER — Encounter: Payer: Self-pay | Admitting: Family Medicine

## 2015-08-06 ENCOUNTER — Other Ambulatory Visit: Payer: Medicare Other

## 2015-08-06 ENCOUNTER — Telehealth: Payer: Self-pay | Admitting: Surgical

## 2015-08-06 ENCOUNTER — Encounter: Payer: Self-pay | Admitting: *Deleted

## 2015-08-06 MED ORDER — METRONIDAZOLE 500 MG PO TABS: 500.0000 mg | ORAL_TABLET | Freq: Three times a day (TID) | ORAL | Status: DC

## 2015-08-06 MED ORDER — CIPROFLOXACIN HCL 500 MG PO TABS: 500.0000 mg | ORAL_TABLET | Freq: Two times a day (BID) | ORAL | Status: DC

## 2015-08-06 NOTE — Telephone Encounter (Signed)
I have sent in ciprofloxacin and Flagyl for him to take for the diverticulitis. If he has taken the Avelox today he should start the ciprofloxacin and Flagyl tomorrow. He should be following a liquid only diet. If his abdominal pain worsens or he is unable to tolerate liquids or if he develops fevers he should be evaluated again.

## 2015-08-06 NOTE — Telephone Encounter (Signed)
Left message for patient to return call. Patient scheduled for appointment 08/07/15 at 9:00 AM.

## 2015-08-06 NOTE — Telephone Encounter (Signed)
Spoke with patients wife and told her that Dr. Caryl Bis will send in two different antibiotics for him to take. Explained that he was trying to treat both symptoms, but since the avelox was to expensive he will send in the two antibiotics.

## 2015-08-06 NOTE — Addendum Note (Signed)
Addended by: Caryl Bis Amandy Chubbuck G on: 08/06/2015 12:27 PM   Modules accepted: Orders

## 2015-08-07 ENCOUNTER — Telehealth: Payer: Self-pay | Admitting: Family Medicine

## 2015-08-07 ENCOUNTER — Other Ambulatory Visit: Payer: Self-pay

## 2015-08-07 ENCOUNTER — Encounter: Payer: Self-pay | Admitting: Family Medicine

## 2015-08-07 ENCOUNTER — Ambulatory Visit (INDEPENDENT_AMBULATORY_CARE_PROVIDER_SITE_OTHER): Payer: Medicare Other | Admitting: Family Medicine

## 2015-08-07 VITALS — BP 130/82 | HR 76 | Temp 98.5°F | Ht 69.0 in | Wt 204.8 lb

## 2015-08-07 DIAGNOSIS — J0110 Acute frontal sinusitis, unspecified: Secondary | ICD-10-CM

## 2015-08-07 DIAGNOSIS — R339 Retention of urine, unspecified: Secondary | ICD-10-CM

## 2015-08-07 DIAGNOSIS — M791 Myalgia, unspecified site: Secondary | ICD-10-CM

## 2015-08-07 DIAGNOSIS — K5732 Diverticulitis of large intestine without perforation or abscess without bleeding: Secondary | ICD-10-CM

## 2015-08-07 DIAGNOSIS — M7989 Other specified soft tissue disorders: Secondary | ICD-10-CM | POA: Diagnosis not present

## 2015-08-07 DIAGNOSIS — Z8546 Personal history of malignant neoplasm of prostate: Secondary | ICD-10-CM

## 2015-08-07 LAB — CK: Total CK: 272 U/L — ABNORMAL HIGH (ref 7–232)

## 2015-08-07 MED ORDER — TAMSULOSIN HCL 0.4 MG PO CAPS: 0.4000 mg | ORAL_CAPSULE | Freq: Every day | ORAL | Status: DC

## 2015-08-07 NOTE — Telephone Encounter (Signed)
Pt was in office today. He brought all his medications with him. He has lost his tamsulosin (FLOMAX) 0.4 MG CAPS capsule, between coming out from our office to home. He is not sure if he still needs to take this medication. If he does he is requesting a refill, since he has lost his pill bottle.

## 2015-08-07 NOTE — Telephone Encounter (Signed)
Spoke with the patient, resent to [place] for him. thanks

## 2015-08-07 NOTE — Patient Instructions (Signed)
Nice to see you. Please complete the course of your ciprofloxacin and Flagyl. Please stick to a liquid diet as we discussed. Please hold off on taking any laxatives until your stomach begins to feel improved. Please monitor her leg. If it becomes numb or weak redevelop loss of bowel or bladder function, numbness between your legs, or fevers please seek medical attention. We will check repeat lab work today to ensure your muscle enzymes are improving. If you develop abdominal pain, blood in her stool, nausea, vomiting, numbness, weakness, loss of bowel or bladder function, fevers, numbness between your legs, or any new or change in symptoms please seek medical attention.

## 2015-08-07 NOTE — Progress Notes (Signed)
Pre visit review using our clinic review tool, if applicable. No additional management support is needed unless otherwise documented below in the visit note. 

## 2015-08-07 NOTE — Assessment & Plan Note (Signed)
Urinating well this time. He will continue follow-up with urology. I advised that he should call them to discuss whether or not to take Flomax. He'll also discuss with them whether or not referral [facility] is appropriate.

## 2015-08-07 NOTE — Telephone Encounter (Signed)
Went over everything in appointment

## 2015-08-07 NOTE — Telephone Encounter (Signed)
Spoke with patient about medication. Per Dr. Caryl Bis patient needs to contact urology to discuss taking this medication. We did not fax RX to pharmacy.

## 2015-08-07 NOTE — Assessment & Plan Note (Signed)
Improved. Continue to monitor.

## 2015-08-07 NOTE — Assessment & Plan Note (Signed)
Recheck CK today.

## 2015-08-07 NOTE — Assessment & Plan Note (Signed)
Miraculously resolved with no intervention after long period of swelling. He has noted some mild tingling over the anterior aspect of his leg that is intermittent. He is neurologically intact in lower extremities. He has no back discomfort with this. This could be related to nerve impingement. He'll continue to monitor. If persists or worsens he will let us know. If he develops any new symptoms he will seek medical attention. Given return precautions.

## 2015-08-07 NOTE — Assessment & Plan Note (Addendum)
Revealed on CT scan earlier this week. No signs of perforation or abscess. Has been tolerating ciprofloxacin and Flagyl over the last day. Did get 1 dose of Avelox. Benign abdominal exam today. He feels improved. He has tolerated some bland foods. Discussed continuing liquid diet until his pain continues to remain away. Advised to avoid MiraLAX and senna until he remains pain-free He'll continue to monitor. He'll finish the course of ciprofloxacin and Flagyl. He is given return precautions.

## 2015-08-07 NOTE — Progress Notes (Signed)
Patient ID: Cameron Perna., male   DOB: [birth year], 75 y.o.   MRN: [id number]  [name], MD Phone: [phone]  Cameron Pigeon Yanuel Tagg. is a 75 y.o. male who presents today for follow-up.   Diverticulitis: CT scan earlier this week revealed diverticulitis. Patient was initially started on Avelox though was unable to afford this. He took this for one day. He was then placed on ciprofloxacin and Flagyl of which he started yesterday. He notes his abdominal discomfort is gone. He has tried to stick to a liquid diet but did have a milkshake yesterday and ate some toast and bacon this morning due to being hungry. No abdominal pain with this. No nausea, vomiting, or blood in his stool. He has been taking MiraLAX and senna to help with bowel movements. He did take last night and had a good bowel movement this morning.  Sinus issues are much improved. He did see the dentist yesterday to ensure that it was not a tooth issue. They did clean off one of this. They gave him a chlorhexidine rinse to use with this.  History of prostate cancer: He is seeing a urologist for this. There was felt to be likely issues relating to the radiation he received and they're considering doing a TURP to help with his symptoms. He is urinating okay at this time. He is not taking Flomax. He will discuss potentially seeing someone at [facility] with his urologist.  He notes his right leg is not swollen anymore. The pain in his legs is improved as well. He does note a mild tingling intermittently over the anterior aspect of his right leg over the last several days. There is no numbness or weakness. No back pain. No loss of bowel or bladder function. No saddle anesthesia. No fevers.   PMH: Former smoker ROS see history of present illness  Objective  Physical Exam Filed Vitals:   08/07/15 0900  BP: 130/82  Pulse: 76  Temp: 98.5 F (36.9 C)    BP Readings from Last 3 Encounters:  08/07/15 130/82  08/04/15 122/74  08/03/15  106/64   Wt Readings from Last 3 Encounters:  08/07/15 204 lb 12.8 oz (92.897 kg)  08/04/15 205 lb (92.987 kg)  08/03/15 204 lb 9.6 oz (92.806 kg)    Physical Exam  Constitutional: He is well-developed, well-nourished, and in no distress.  HENT:  Head: Normocephalic and atraumatic.  Right Ear: External ear normal.  Left Ear: External ear normal.  Mouth/Throat: Oropharynx is clear and moist. No oropharyngeal exudate.  Eyes: Conjunctivae are normal. Pupils are equal, round, and reactive to light.  Cardiovascular: Normal rate, regular rhythm and normal heart sounds.   Pulmonary/Chest: Effort normal and breath sounds normal.  Abdominal: Soft. Bowel sounds are normal. He exhibits no distension. There is no tenderness. There is no rebound and no guarding.  Musculoskeletal: He exhibits no edema.  No midline spine tenderness, no midline spine step-off, no muscular back tenderness, no muscular tenderness in his legs  Neurological: He is alert. Gait normal.  5 out of 5 strength bilateral quads, and chains, plantar flexion, dorsiflexion, sensation light touch intact in bilateral lower extremities, 2+ patellar reflexes  Skin: Skin is warm and dry. He is not diaphoretic.     Assessment/Plan: Please see individual problem list.  Diverticulitis of colon Revealed on CT scan earlier this week. No signs of perforation or abscess. Has been tolerating ciprofloxacin and Flagyl over the last day. Did get 1 dose of Avelox. Benign  abdominal exam today. He feels improved. He has tolerated some bland foods. Discussed continuing liquid diet until his pain continues to remain away. Advised to avoid MiraLAX and senna until he remains pain-free He'll continue to monitor. He'll finish the course of ciprofloxacin and Flagyl. He is given return precautions.  Myalgia Recheck CK today.  Sinusitis, acute frontal Improved. Continue to monitor.  Swelling of right lower extremity Miraculously resolved with no  intervention after long period of swelling. He has noted some mild tingling over the anterior aspect of his leg that is intermittent. He is neurologically intact in lower extremities. He has no back discomfort with this. This could be related to nerve impingement. He'll continue to monitor. If persists or worsens he will let us know. If he develops any new symptoms he will seek medical attention. Given return precautions.  History of prostate cancer Urinating well this time. He will continue follow-up with urology. I advised that he should call them to discuss whether or not to take Flomax. He'll also discuss with them whether or not referral [facility] is appropriate.    Orders Placed This Encounter  Procedures  . CK (Creatine Kinase)    [name], MD [place]

## 2015-08-10 ENCOUNTER — Telehealth: Payer: Self-pay | Admitting: Family Medicine

## 2015-08-10 ENCOUNTER — Encounter: Payer: Self-pay | Admitting: Family Medicine

## 2015-08-10 NOTE — Telephone Encounter (Signed)
Patient stated that his IBS has passed, he currently has no pain. He requested a call from [facility] [phone]

## 2015-08-10 NOTE — Telephone Encounter (Signed)
Noted. Patient should try Flonase and Claritin to see if that helps with his sinus congestion. I am more than happy to refer to ENT if he would like. Please let him know if he redevelops abdominal pain he needs to be evaluated.

## 2015-08-10 NOTE — Telephone Encounter (Signed)
Can you call the patient regarding his mychart message and find out if he is still having abdominal pain?

## 2015-08-10 NOTE — Telephone Encounter (Signed)
Spoke with patient and he says that he has sinus pressure with nasal congestion . He also said that he is only having bloating in the stomach now no pain. Would be happy to see ENT

## 2015-08-10 NOTE — Telephone Encounter (Signed)
LM for patient to return call.

## 2015-08-11 NOTE — Telephone Encounter (Signed)
Advised patient to try Flonase and claritin to see if this helps with sinus congestion. Patient will call back if he decides he wants to go to ENT. He is still having abdominal discomfort, but not severe pain.

## 2015-08-11 NOTE — Telephone Encounter (Signed)
Noted. If pain worsens he should be evaluated. Will plan on seeing him in the office tomorrow.

## 2015-08-12 ENCOUNTER — Encounter: Payer: Self-pay | Admitting: Family Medicine

## 2015-08-12 ENCOUNTER — Ambulatory Visit (INDEPENDENT_AMBULATORY_CARE_PROVIDER_SITE_OTHER): Payer: Medicare Other | Admitting: Family Medicine

## 2015-08-12 VITALS — BP 112/62 | HR 75 | Temp 98.3°F | Ht 69.0 in | Wt 205.4 lb

## 2015-08-12 DIAGNOSIS — R0602 Shortness of breath: Secondary | ICD-10-CM | POA: Insufficient documentation

## 2015-08-12 DIAGNOSIS — M791 Myalgia, unspecified site: Secondary | ICD-10-CM

## 2015-08-12 DIAGNOSIS — R200 Anesthesia of skin: Secondary | ICD-10-CM

## 2015-08-12 DIAGNOSIS — J320 Chronic maxillary sinusitis: Secondary | ICD-10-CM | POA: Diagnosis not present

## 2015-08-12 DIAGNOSIS — R208 Other disturbances of skin sensation: Secondary | ICD-10-CM

## 2015-08-12 DIAGNOSIS — R0609 Other forms of dyspnea: Secondary | ICD-10-CM | POA: Diagnosis not present

## 2015-08-12 DIAGNOSIS — J329 Chronic sinusitis, unspecified: Secondary | ICD-10-CM | POA: Insufficient documentation

## 2015-08-12 DIAGNOSIS — K5732 Diverticulitis of large intestine without perforation or abscess without bleeding: Secondary | ICD-10-CM

## 2015-08-12 DIAGNOSIS — R0600 Dyspnea, unspecified: Secondary | ICD-10-CM

## 2015-08-12 LAB — CBC
HEMATOCRIT: 33.4 % — AB (ref 38.5–50.0)
HEMOGLOBIN: 11.5 g/dL — AB (ref 13.2–17.1)
MCH: 30.5 pg (ref 27.0–33.0)
MCHC: 34.4 g/dL (ref 32.0–36.0)
MCV: 88.6 fL (ref 80.0–100.0)
MPV: 9.3 fL (ref 7.5–12.5)
PLATELETS: 319 10*3/uL (ref 140–400)
RBC: 3.77 MIL/uL — AB (ref 4.20–5.80)
RDW: 13.6 % (ref 11.0–15.0)
WBC: 6.7 10*3/uL (ref 3.8–10.8)

## 2015-08-12 LAB — COMPREHENSIVE METABOLIC PANEL
ALBUMIN: 3.4 g/dL — AB (ref 3.6–5.1)
ALT: 16 U/L (ref 9–46)
AST: 22 U/L (ref 10–35)
Alkaline Phosphatase: 44 U/L (ref 40–115)
BUN: 22 mg/dL (ref 7–25)
CALCIUM: 8.9 mg/dL (ref 8.6–10.3)
CHLORIDE: 105 mmol/L (ref 98–110)
CO2: 24 mmol/L (ref 20–31)
Creat: 1.4 mg/dL — ABNORMAL HIGH (ref 0.70–1.18)
GLUCOSE: 112 mg/dL — AB (ref 65–99)
POTASSIUM: 4 mmol/L (ref 3.5–5.3)
Sodium: 138 mmol/L (ref 135–146)
Total Bilirubin: 0.3 mg/dL (ref 0.2–1.2)
Total Protein: 6.2 g/dL (ref 6.1–8.1)

## 2015-08-12 LAB — CK: Total CK: 302 U/L — ABNORMAL HIGH (ref 7–232)

## 2015-08-12 NOTE — Progress Notes (Signed)
Patient ID: Cameron Foster., male   DOB: 1941-02-23, 75 y.o.   MRN: [id number]  Cameron Rumps, MD Phone: [phone]  Cameron Pigeon Ziyan Schoon. is a 75 y.o. male who presents today for follow-up.  Patient notes his abdominal pain has resolved. He notes occasional bloating in his abdomen. He has been having bowel movements daily. Notes they are dark brown and does not think they're black. No blood in his stool. no vomiting. Some nausea. No fevers. He has one more day left of ciprofloxacin and Flagyl.  Patient continues to note sinus issues. He notes sinus congestion. He started the Claritin. He occasionally feels woozy. Describes this as some lightheadedness. Has not use the Flonase due to concern with his history of cataract surgery. He does take 3 blood pressure medicines.  Additionally notes some exertional fatigue and possible shortness of breath only when he walks. If he does any physical activity outside other than walking he has no symptoms. He has decreased energy levels. No chest pain. No orthopnea. No PND. No lower shin venous edema. Did have mild anemia previously. No chest pain or shortness of breath now.  Patient additionally notes that he had some right leg numbness on Sunday that resolved relatively quickly. No back pain. He is unsure if it was his whole leg or not. He notes he has had this before relating to back issues. It can be improved with shifting of positions. No saddle anesthesia, weakness, loss of bowel or bladder function, or fevers.  PMH: Former smoker   ROS see history of present illness  Objective  Physical Exam Filed Vitals:   08/12/15 1530  BP: 112/62  Pulse: 75  Temp: 98.3 F (36.8 C)    BP Readings from Last 3 Encounters:  08/12/15 112/62  08/07/15 130/82  08/04/15 122/74   Wt Readings from Last 3 Encounters:  08/12/15 205 lb 6.4 oz (93.169 kg)  08/07/15 204 lb 12.8 oz (92.897 kg)  08/04/15 205 lb (92.987 kg)   Laying blood pressure 104/64, pulse  64 Sitting blood pressure 102/62, pulse 67 Standing blood pressure 98/60, pulse 72  Physical Exam  Constitutional: He is well-developed, well-nourished, and in no distress.  HENT:  Head: Normocephalic and atraumatic.  Right Ear: External ear normal.  Left Ear: External ear normal.  Mouth/Throat: Oropharynx is clear and moist. No oropharyngeal exudate.  Eyes: Conjunctivae are normal. Pupils are equal, round, and reactive to light.  Neck: Neck supple.  Cardiovascular: Normal rate, regular rhythm and normal heart sounds.   Pulmonary/Chest: Effort normal and breath sounds normal.  Abdominal: Soft. Bowel sounds are normal. He exhibits no distension. There is tenderness (minimal suprapubic tenderness). There is no rebound and no guarding.  Musculoskeletal: He exhibits no edema.  Lymphadenopathy:    He has no cervical adenopathy.  Neurological: He is alert.  CN 2-12 intact, 5/5 strength in bilateral biceps, triceps, grip, quads, hamstrings, plantar and dorsiflexion, sensation to light touch intact in bilateral UE and LE, normal gait, 2+ patellar reflexes  Skin: Skin is warm and dry. He is not diaphoretic.   EKG: Normal sinus rhythm, rate 66, oldest criteria met for LVH, nonspecific T-wave abnormalities  Assessment/Plan: Please see individual problem list.  Diverticulitis of colon Symptoms much improved. Minimal suprapubic discomfort that could be related to his recent issues with BPH and urination followed by urology. Notes he is urinating. Screening might be slightly worse with use of Flomax recently. Discussed that he should call his urologist regarding this. No left  lower quadrant tenderness. Vital signs are stable. FOBT was negative in the office today given his complaint of dark stools. We will check a CBC. He will finish his antibiotics and continue to monitor his symptoms. Given return precautions.  Myalgia Improved. Recheck CK.   Chronic sinus infection Patient with persistent  sinus congestion despite treatment with multiple antibiotics. Benign exam. His lightheadedness could be related to this or his low normal blood pressures. We'll have him hold his amlodipine to see if this improves his symptoms and monitor his blood pressure at home. He will call if it is greater than 140/90. We'll refer to ENT for evaluation of his sinus congestion. Given return precautions.  Exertional dyspnea Patient notes exertional fatigue and shortness of breath only when walking, though reports he is able to do any other physical activity with these. Question whether or not this is related to his myalgias or related to a cardiac issue. His EKG did have bolted criteria for LVH. No acute ischemic issues. We will check lab work as outlined below to evaluate for cause. We will refer to cardiology for further evaluation. He is given return precautions.  Right leg numbness Brief numbness in his right leg 4 days ago. Resolved relatively quickly. Has had before and changes with change in position. Suspect likely nerve impingement. Neurologically intact at this time. He will continue to monitor. If persists or recurs could consider lumbar spine imaging. Given return precautions.    Orders Placed This Encounter  Procedures  . B Nat Peptide  . CK (Creatine Kinase)  . Comp Met (CMET)  . CBC  . Ambulatory referral to Cardiology    Referral Priority:  Routine    Referral Type:  Consultation    Referral Reason:  Specialty Services Required    Requested Specialty:  Cardiology    Number of Visits Requested:  1  . Ambulatory referral to ENT    Referral Priority:  Routine    Referral Type:  Consultation    Referral Reason:  Specialty Services Required    Requested Specialty:  Otolaryngology    Number of Visits Requested:  1  . EKG 12-Lead    Cameron Rumps, MD [place]

## 2015-08-12 NOTE — Assessment & Plan Note (Addendum)
Symptoms much improved. Minimal suprapubic discomfort that could be related to his recent issues with BPH and urination followed by urology. Notes he is urinating. Screening might be slightly worse with use of Flomax recently. Discussed that he should call his urologist regarding this. No left lower quadrant tenderness. Vital signs are stable. FOBT was negative in the office today given his complaint of dark stools. We will check a CBC. He will finish his antibiotics and continue to monitor his symptoms. Given return precautions.

## 2015-08-12 NOTE — Patient Instructions (Signed)
Nice to see you. We are going to refer you to cardiology and ENT for evaluation. You should finish your ciprofloxacin and Flagyl. We're going to check some lab work to evaluate for cause for your breathing issues. If you develop chest pain, shortness of breath, recurrence of abdominal pain, blood in her stools, black stools, vomiting, fevers, numbness, weakness, or any new or changing symptoms please seek medical attention.

## 2015-08-12 NOTE — Progress Notes (Signed)
Pre visit review using our clinic review tool, if applicable. No additional management support is needed unless otherwise documented below in the visit note. 

## 2015-08-12 NOTE — Assessment & Plan Note (Signed)
Patient with persistent sinus congestion despite treatment with multiple antibiotics. Benign exam. His lightheadedness could be related to this or his low normal blood pressures. We'll have him hold his amlodipine to see if this improves his symptoms and monitor his blood pressure at home. He will call if it is greater than 140/90. We'll refer to ENT for evaluation of his sinus congestion. Given return precautions.

## 2015-08-12 NOTE — Assessment & Plan Note (Signed)
Improved. Recheck CK.

## 2015-08-12 NOTE — Assessment & Plan Note (Signed)
Patient notes exertional fatigue and shortness of breath only when walking, though reports he is able to do any other physical activity with these. Question whether or not this is related to his myalgias or related to a cardiac issue. His EKG did have bolted criteria for LVH. No acute ischemic issues. We will check lab work as outlined below to evaluate for cause. We will refer to cardiology for further evaluation. He is given return precautions.

## 2015-08-12 NOTE — Assessment & Plan Note (Signed)
Brief numbness in his right leg 4 days ago. Resolved relatively quickly. Has had before and changes with change in position. Suspect likely nerve impingement. Neurologically intact at this time. He will continue to monitor. If persists or recurs could consider lumbar spine imaging. Given return precautions.

## 2015-08-13 ENCOUNTER — Ambulatory Visit (INDEPENDENT_AMBULATORY_CARE_PROVIDER_SITE_OTHER): Payer: Medicare Other | Admitting: Urology

## 2015-08-13 ENCOUNTER — Encounter: Payer: Self-pay | Admitting: Urology

## 2015-08-13 ENCOUNTER — Telehealth: Payer: Self-pay | Admitting: Family Medicine

## 2015-08-13 VITALS — BP 114/74 | HR 69 | Ht 69.0 in | Wt 205.7 lb

## 2015-08-13 DIAGNOSIS — Z8546 Personal history of malignant neoplasm of prostate: Secondary | ICD-10-CM

## 2015-08-13 DIAGNOSIS — R399 Unspecified symptoms and signs involving the genitourinary system: Secondary | ICD-10-CM

## 2015-08-13 DIAGNOSIS — R310 Gross hematuria: Secondary | ICD-10-CM | POA: Diagnosis not present

## 2015-08-13 DIAGNOSIS — N529 Male erectile dysfunction, unspecified: Secondary | ICD-10-CM | POA: Diagnosis not present

## 2015-08-13 LAB — BRAIN NATRIURETIC PEPTIDE: BRAIN NATRIURETIC PEPTIDE: 117.3 pg/mL — AB (ref <100)

## 2015-08-13 MED ORDER — LIDOCAINE HCL 2 % EX GEL
1.0000 "application " | Freq: Once | CUTANEOUS | Status: AC
  Administered 2015-08-13: 1 via URETHRAL

## 2015-08-13 NOTE — Progress Notes (Signed)
08/13/2015 4:30 PM   [place]. [birth year] [phone]  Referring provider: Leone Haven, MD [place], [place] [address]  No chief complaint on file.   HPI: 1 - Moderate Risk Prostate Cancer - s/p external beam + 74mo andogen deprivation for Gleason 7 disease 2012. Recent Surveillance: 07/2015 PSA 0.05  2 - Erectile Dysfunction - inability to achieve erection x years. Used to use PDE5i but now minimaly sexually active and not big priority.  3 - Lower Urinary Tract Symptoms - long h/o mix of obstrutive and irritative symptoms even pre-radiation therapy. PVR 07/2015 "338m / normal today.   4 - Gross Hematuria / Clot Retention - new gross hematuria / retention 07/2015 prompting bedside cysto / clot evacuation by WrJeffie PollockNO large bladder masses, but cysto done at time of large volume clot. CT showed bilateral renal cysts (largest 6.5 cm) and bladder wall thickening   PMH: Past Medical History  Diagnosis Date  . Alcoholism ([facility]  . Arthritis   . [facility]    treated with radiation therapy.  . Diverticulitis   . Fainting   . Arrhythmia   . Hypertension   . Hyperlipidemia   . Colon polyp   . Vertigo   . [facility]    Surgical History: Past Surgical History  Procedure Laterality Date  . Knee arthroscopy    . Prostate surgery      Microwave therapy    Home Medications:    Medication List       This list is accurate as of: 08/13/15  4:30 PM.  Always use your most recent med list.               amLODipine 5 MG tablet  Commonly known as:  NORVASC  Take 5 mg by mouth every night at bedtime.     atenolol 25 MG tablet  Commonly known as:  TENORMIN  Take 25 mg by mouth once daily.     chlorhexidine 0.12 % solution  Commonly known as:  PERIDEX     ciprofloxacin 500 MG tablet  Commonly known as:  CIPRO  Take 1 tablet (500 mg total) by mouth 2 (two) times daily.     doxycycline 100 MG tablet    Commonly known as:  VIBRA-TABS     hydrocortisone 2.5 % cream  Apply topically 2 (two) times daily.     hyoscyamine 0.125 MG SL tablet  Commonly known as:  LEVSIN SL  Place 1 tablet (0.125 mg total) under the tongue every 4 (four) hours as needed for cramping.     lisinopril 10 MG tablet  Commonly known as:  PRINIVIL,ZESTRIL  Take 10 mg by mouth once daily.     metroNIDAZOLE 500 MG tablet  Commonly known as:  FLAGYL  Take 1 tablet (500 mg total) by mouth 3 (three) times daily.     multivitamin tablet  Take 1 tablet by mouth daily.     omeprazole 40 MG capsule  Commonly known as:  PRILOSEC  Take 40 mg by mouth daily as needed (for heartburn/indigestion.).     polyethylene glycol powder powder  Commonly known as:  GLYCOLAX/MIRALAX  Take 17 g by mouth 2 (two) times daily as needed.     sennosides-docusate sodium 8.6-50 MG tablet  Commonly known as:  SENOKOT-S  Take 2 tablets by mouth every morning.     tamsulosin 0.4 MG Caps capsule  Commonly known as:  FLOMAX  Take 1 capsule (0.4 mg total) by mouth daily.        Allergies:  Allergies  Allergen Reactions  . Formaldehyde Other (See Comments)    Family History: Family History  Problem Relation Age of Onset  . Lung cancer      parent  . Bipolar disorder Son   . Sudden death Son     due to drug over dose and cardiac issues  . Prostate cancer Neg Hx   . Bladder Cancer Neg Hx     Social History:  reports that he has quit smoking. His smokeless tobacco use includes Chew. He reports that he drinks about 0.6 oz of alcohol per week. He reports that he does not use illicit drugs.  ROS:                                        Physical Exam: BP 114/74 mmHg  Pulse 69  Ht 5' 9"  (1.753 m)  Wt 205 lb 11.2 oz (93.305 kg)  BMI 30.36 kg/m2  Constitutional:  Alert and oriented, No acute distress. HEENT: [place] AT, moist mucus membranes.  Trachea midline, no masses. Cardiovascular: No clubbing,  cyanosis, or edema. Respiratory: Normal respiratory effort, no increased work of breathing. GI: Abdomen is soft, nontender, nondistended, no abdominal masses GU: No CVA tenderness.  Skin: No rashes, bruises or suspicious lesions. Lymph: No cervical or inguinal adenopathy. Neurologic: Grossly intact, no focal deficits, moving all 4 extremities. Psychiatric: Normal mood and affect.  Laboratory Data: Lab Results  Component Value Date   WBC 6.7 08/12/2015   HGB 11.5* 08/12/2015   HCT 33.4* 08/12/2015   MCV 88.6 08/12/2015   PLT 319 08/12/2015    Lab Results  Component Value Date   CREATININE 1.40* 08/12/2015    Lab Results  Component Value Date   PSA 0.05* 07/30/2015   PSA 0.09* 01/20/2015    No results found for: TESTOSTERONE  Lab Results  Component Value Date   HGBA1C 5.9 07/30/2015    Urinalysis    Component Value Date/Time   COLORURINE YELLOW* 08/01/2015 1403   APPEARANCEUR Cloudy* 08/04/2015 1343   APPEARANCEUR CLOUDY* 08/01/2015 1403   LABSPEC 1.011 08/01/2015 1403   PHURINE 8.0 08/01/2015 1403   GLUCOSEU Negative 08/04/2015 1343   HGBUR 3+* 08/01/2015 1403   BILIRUBINUR Negative 08/04/2015 1343   BILIRUBINUR NEGATIVE 08/01/2015 1403   KETONESUR 1+* 08/01/2015 1403   PROTEINUR 2+* 08/04/2015 1343   PROTEINUR 100* 08/01/2015 1403   NITRITE Negative 08/04/2015 1343   NITRITE NEGATIVE 08/01/2015 1403   LEUKOCYTESUR Negative 08/04/2015 1343   LEUKOCYTESUR NEGATIVE 08/01/2015 1403    Pertinent Imaging: CLINICAL DATA: LEFT lower quadrant pain and gross hematuria for 1 week, early satiety, constipation, history irritable bowel syndrome, diverticulitis, prostate cancer post radiation therapy, hypertension, atrial fibrillation, former smoker  EXAM: CT ABDOMEN AND PELVIS WITH CONTRAST  TECHNIQUE: Multidetector CT imaging of the abdomen and pelvis was performed using the standard protocol following bolus administration of intravenous contrast. Sagittal  and coronal MPR images reconstructed from axial data set.  CONTRAST: 55m ISOVUE-300 IOPAMIDOL (ISOVUE-300) INJECTION 61% IV. Dilute oral contrast.  COMPARISON: None  FINDINGS: Minimal bibasilar atelectasis.  BILATERAL renal cysts measuring up to 6.1 x 5.6 cm RIGHT upper pole image 26 and 4.7 x 3.4 cm mid LEFT kidney image 30.  Liver, gallbladder, spleen, pancreas, kidneys, and adrenal glands otherwise unremarkable.  No urinary tract calcification, hydronephrosis or ureteral dilatation.  Mild bladder wall thickening.  Patient has a history of prostate surgery, with several surgical clips identified at prostate gland, though there appears to be residual prostate tissue which is prominent in size.  Extensive colonic diverticulosis greatest at sigmoid colon.  Scattered colonic wall thickening at descending colon and sigmoid colon with pericolic inflammatory changes at the descending colon and at the mid sigmoid colon compatible with diverticulitis.  Stomach and bowel loops otherwise normal appearance.  Normal appendix.  Multiple normal sized mesenteric nodes especially in sigmoid region, likely reactive.  No mass, adenopathy, if free air, free fluid, abscess, or hernia.  Scattered atherosclerotic calcifications.  No acute osseous findings.  IMPRESSION: Extensive colonic diverticulosis with foci wall thickening and pericolic inflammatory changes at the descending and sigmoid colon consistent with acute diverticulitis.  No definite evidence of abscess or perforation.  Mild nonspecific bladder wall thickening, can be seen with chronic outlet obstruction, infection, tumor not completely excluded with this appearance.  Prior prostate surgery with question mild enlargement of residual prostate tissue.  BILATERAL renal cysts   Cystoscopy Procedure Note  Patient identification was confirmed, informed consent was obtained, and patient was  prepped using Betadine solution.  Lidocaine jelly was administered per urethral meatus.    Preoperative abx where received prior to procedure.     Pre-Procedure: - Inspection reveals a normal caliber ureteral meatus.  Procedure: The flexible cystoscope was introduced without difficulty - No urethral strictures/lesions are present. - Enlarged prostate  - Normal bladder neck - Bilateral ureteral orifices identified - Bladder mucosa somewhat erythematous consistent with radiation changes. No tumors - No bladder stones - No trabeculation  Retroflexion shows no intravesical lobe   Post-Procedure: - Patient tolerated the procedure well     Assessment & Plan:    1 - Moderate Risk Prostate Cancer - PSA kinetics suggests excellent control. Repeat PSA in one year  2 - Erectile Dysfunction - observe as per pt request.  3 - Lower Urinary Tract Symptoms - Minimal at this time   4 - Gross Hematuria / Clot Retention -Resolved. CT showed cysts and mild bladder wall thickening. Cysto with mild radiation changes but no tumor. Continue to monitor for recurrence  Return in about 1 year (around 08/12/2016) for with PSA one week prior.  Nickie Retort, MD  [facility] [address], [place], [place] [address] [phone]

## 2015-08-13 NOTE — Telephone Encounter (Signed)
Pt wanted to know if he could do cologuard test instead of what he was advised of in his appt today. Pt stated that it would be easier. Please advise pt/

## 2015-08-13 NOTE — Telephone Encounter (Signed)
Can we order cologuard for patient instead?

## 2015-08-14 LAB — URINALYSIS, COMPLETE
BILIRUBIN UA: NEGATIVE
Glucose, UA: NEGATIVE
KETONES UA: NEGATIVE
NITRITE UA: NEGATIVE
Protein, UA: NEGATIVE
Urobilinogen, Ur: 0.2 mg/dL (ref 0.2–1.0)
pH, UA: 5.5 (ref 5.0–7.5)

## 2015-08-14 LAB — MICROSCOPIC EXAMINATION
Bacteria, UA: NONE SEEN
EPITHELIAL CELLS (NON RENAL): NONE SEEN /HPF (ref 0–10)

## 2015-08-14 NOTE — Telephone Encounter (Signed)
Patient needs to complete stool cards. We are looking for blood. Cologuard does not evaluate for blood.

## 2015-08-14 NOTE — Telephone Encounter (Signed)
Patient is fine with doing stool cards instead of cologuard. He is wanting to know if you can prescribe something for his nerves due to having so much going on right now?

## 2015-08-14 NOTE — Telephone Encounter (Signed)
Pt called back to check the status of the cologuard and pt has a question regarding medication for his nerves. Call pt @ [phone] and cell [phone]. Thank you!

## 2015-08-16 ENCOUNTER — Encounter: Payer: Self-pay | Admitting: Family Medicine

## 2015-08-17 ENCOUNTER — Encounter: Payer: Self-pay | Admitting: Family Medicine

## 2015-08-18 ENCOUNTER — Other Ambulatory Visit: Payer: Self-pay | Admitting: Surgical

## 2015-08-18 ENCOUNTER — Other Ambulatory Visit (INDEPENDENT_AMBULATORY_CARE_PROVIDER_SITE_OTHER): Payer: Medicare Other

## 2015-08-18 ENCOUNTER — Encounter: Payer: Self-pay | Admitting: Family Medicine

## 2015-08-18 ENCOUNTER — Other Ambulatory Visit: Payer: Self-pay | Admitting: *Deleted

## 2015-08-18 DIAGNOSIS — Z1211 Encounter for screening for malignant neoplasm of colon: Secondary | ICD-10-CM

## 2015-08-18 LAB — FECAL OCCULT BLOOD, IMMUNOCHEMICAL: FECAL OCCULT BLD: NEGATIVE

## 2015-08-19 NOTE — Telephone Encounter (Signed)
Responded through mychart to the patient.

## 2015-08-20 ENCOUNTER — Encounter: Payer: Self-pay | Admitting: General Surgery

## 2015-08-20 ENCOUNTER — Ambulatory Visit (INDEPENDENT_AMBULATORY_CARE_PROVIDER_SITE_OTHER): Payer: Medicare Other | Admitting: General Surgery

## 2015-08-20 ENCOUNTER — Other Ambulatory Visit: Payer: Self-pay | Admitting: Family Medicine

## 2015-08-20 ENCOUNTER — Ambulatory Visit: Payer: Medicare Other

## 2015-08-20 VITALS — BP 120/68 | HR 68 | Resp 14 | Ht 69.0 in | Wt 205.0 lb

## 2015-08-20 VITALS — BP 126/78 | HR 72

## 2015-08-20 DIAGNOSIS — K5732 Diverticulitis of large intestine without perforation or abscess without bleeding: Secondary | ICD-10-CM

## 2015-08-20 DIAGNOSIS — I10 Essential (primary) hypertension: Secondary | ICD-10-CM

## 2015-08-20 MED ORDER — SERTRALINE HCL 25 MG PO TABS: 25.0000 mg | ORAL_TABLET | Freq: Every day | ORAL | Status: DC

## 2015-08-20 NOTE — Patient Instructions (Addendum)
Recommend adding fiber supplement to his diet twice a day. The patient is aware to call back for any questions or concerns. Locate prior colonoscopy

## 2015-08-20 NOTE — Progress Notes (Signed)
Patient ID: Cameron Foster., male   DOB: 01-21-1941, 75 y.o.   MRN: [id number]  Chief Complaint  Patient presents with  . Diverticulitis    HPI Cameron Foster. is a 75 y.o. male here for assessment of diverticulitis. He has a history of Altered will episodes of diverticulitis for the past 30 years. He reports episodes have occurred approximately annually for the last few years. His most recent episode of diverticulitis again around the middle of April.  He reported symptoms of constipation followed by abdominal pain with each of these episodes. He was treated with antibiotics. He reports marked improvement in his abdominal pain since completing his course of oral antibiotics. He had a CT of the abdomen and pelvis completed on 08/05/15.    The patient reports making use of Pepto-Bismol on April 5-6, 2017 for some upper abdominal distress. On day 3-4 was oral antibiotics, initiated mid April he reported black stools that were tarry by description. These of resolved. Stool testing completed by his PCP was negative for blood. During this time he denied any diarrhea prior to or after the initiation of oral antibiotic therapy.  The patient moved here from [place] in September 2016. Previous diagnostic studies including a colonoscopy were completed outside [place]. He reports his last colonoscopy was 2015-2016.  He's had some implant of swelling in the right leg, this appears to be unrelated to his present episode of abdominal pain.  He's been aware of a mild bulge in the upper midline of the abdomen for some time which is asymptomatic.  I personally reviewed the patient's history.      HPI  Past Medical History  Diagnosis Date  . Alcoholism ([place])   . Arthritis   . Diverticulitis   . Fainting   . Arrhythmia   . Hypertension   . Hyperlipidemia   . Colon polyp   . Vertigo   . Atrial fibrillation ([place])     one episode  . History of blood clots   . Irritable bowel syndrome   .  Diverticulosis 30 years  . Prostate cancer ([place]) 2012    treated with radiation therapy.    Past Surgical History  Procedure Laterality Date  . Knee arthroscopy    . Prostate surgery      Microwave therapy  . Colonoscopy  2016    Family History  Problem Relation Age of Onset  . Lung cancer Father   . Bipolar disorder Son   . Sudden death Son     due to drug over dose and cardiac issues  . Prostate cancer Neg Hx   . Bladder Cancer Neg Hx     Social History Social History  Substance Use Topics  . Smoking status: Former Smoker -- 89 years    Quit date: 04/18/1988  . Smokeless tobacco: Current User    Types: Chew  . Alcohol Use: No    Allergies  Allergen Reactions  . Formaldehyde Other (See Comments)    Current Outpatient Prescriptions  Medication Sig Dispense Refill  . amLODipine (NORVASC) 5 MG tablet Take 5 mg by mouth daily.    Marland Kitchen atenolol (TENORMIN) 25 MG tablet Take 25 mg by mouth once daily.    . hydrocortisone 2.5 % cream Apply topically 2 (two) times daily. 30 g 1  . lisinopril (PRINIVIL,ZESTRIL) 10 MG tablet Take 10 mg by mouth once daily.    . Multiple Vitamin (MULTIVITAMIN) tablet Take 1 tablet by mouth daily.    Marland Kitchen  omeprazole (PRILOSEC) 40 MG capsule Take 40 mg by mouth daily as needed (for heartburn/indigestion.).     Marland Kitchen polyethylene glycol powder (GLYCOLAX/MIRALAX) powder Take 17 g by mouth 2 (two) times daily as needed. 3350 g 1  . sennosides-docusate sodium (SENOKOT-S) 8.6-50 MG tablet Take 2 tablets by mouth every morning.    . sertraline (ZOLOFT) 25 MG tablet Take 1 tablet (25 mg total) by mouth daily. 30 tablet 1  . tamsulosin (FLOMAX) 0.4 MG CAPS capsule Take 1 capsule (0.4 mg total) by mouth daily. 30 capsule 0   No current facility-administered medications for this visit.    Review of Systems Review of Systems  Constitutional: Negative.  Negative for unexpected weight change.  HENT: Negative.   Eyes: Negative.   Respiratory: Negative.    Cardiovascular: Negative.   Gastrointestinal: Positive for abdominal pain and abdominal distention. Negative for nausea, vomiting, diarrhea, constipation, blood in stool, anal bleeding and rectal pain.  Endocrine: Negative.   Genitourinary: Negative.   Musculoskeletal: Negative.   Skin: Negative.   Allergic/Immunologic: Negative.   Neurological: Negative.   Hematological: Negative.   Psychiatric/Behavioral: Negative.     Blood pressure 120/68, pulse 68, resp. rate 14, height 5' 9"  (1.753 m), weight 205 lb (92.987 kg).  Physical Exam Physical Exam  Constitutional: He is oriented to person, place, and time. He appears well-developed and well-nourished.  Eyes: Conjunctivae are normal. No scleral icterus.  Neck: Neck supple.  Cardiovascular: Normal rate, regular rhythm and normal heart sounds.   Pulses:      Femoral pulses are 2+ on the right side, and 2+ on the left side.      Popliteal pulses are 2+ on the right side, and 2+ on the left side.       Dorsalis pedis pulses are 2+ on the right side, and 2+ on the left side.       Posterior tibial pulses are 2+ on the right side, and 2+ on the left side.  Mild edema involving the right calf. No posterior calf tenderness.  Pulmonary/Chest: Effort normal and breath sounds normal.  Abdominal: Soft. Normal appearance and bowel sounds are normal. There is tenderness in the left lower quadrant.    Musculoskeletal:       Legs: Lymphadenopathy:    He has no cervical adenopathy.  Neurological: He is alert and oriented to person, place, and time.  Skin: Skin is warm and dry.  Psychiatric: He has a normal mood and affect.    Data Reviewed CT scan of the abdomen and pelvis dated 08/05/2015 was reviewed. Extensive diverticulosis involving the distal descending colon and throughout the sigmoid. Area of focal inflammation of the junction of the descending and sigmoid colons. No evidence of abscess or perforation.  Assessment    Episodic  diverticulitis.    Plan    Options for management reviewed: 1)lower bowel pressure with the use of daily fiber supplement versus 2) elective colon resection. The patient's symptoms resolved rapidly with the initiation of oral antibiotics, and I think after reviewing the risks of surgery he is a little less interested in surgical resection at this time.  I think if we can establish more regular, low-pressure elimination  we may avoid surgical intervention.        Recommend adding fiber supplement of his choice to his diet twice a day. Locate prior colonoscopy. Follow up as needed.   PCP: Larwance Rote This has been scribed by Lesly Rubenstein LPN    Hervey Ard  W 08/21/2015, 1:19 PM

## 2015-08-20 NOTE — Progress Notes (Signed)
Patient ID: Cameron Foster., male   DOB: 01/11/1941, 75 y.o.   MRN: [id number] Patients BP on recheck is well controlled. He needs to continue to monitor his BP at home. If it is persistently >150/90 while at home he should let us know. Please see mychart messages with discussion regarding his anxiety.   [name], MD

## 2015-08-20 NOTE — Progress Notes (Signed)
Patient attempted to call the office this am.  He feels like his head is full.  He has had an increased BP since last night, undergoing a lot of stress with Wife being sick.  He was advised to stop his amlodopine due to his dropping BP recently, per notes in the chart.  He took his atentolol and lisnopril and BP was in the 179'G systolically and low 102'V diastolically this am    I have checked vitals, see notes.  Initially they were high, I had him sit here and calm down, they have decreased.  See additional vitals in the chart.   Patient has not been SOB , no headaches, no numbness or tingling noted. no chest pain.   Prior to patient leaving I rechecked his BP, 120/78 on the right arm manual.  He was calm and will wait for a response from you.    He is requesting to have something either BP wise or anxiety wise.  He told me that he sent a my-chart this am.    Please advise if there is something we can do for him.    Can [phone] home [phone] cell.

## 2015-08-21 NOTE — Progress Notes (Signed)
Left a meesage for patient that a mychart was sent to him. Thanks

## 2015-08-24 DIAGNOSIS — J342 Deviated nasal septum: Secondary | ICD-10-CM | POA: Diagnosis not present

## 2015-08-24 DIAGNOSIS — J310 Chronic rhinitis: Secondary | ICD-10-CM | POA: Diagnosis not present

## 2015-08-24 DIAGNOSIS — Z87891 Personal history of nicotine dependence: Secondary | ICD-10-CM | POA: Diagnosis not present

## 2015-08-25 ENCOUNTER — Encounter: Payer: Self-pay | Admitting: Family Medicine

## 2015-08-25 ENCOUNTER — Ambulatory Visit (INDEPENDENT_AMBULATORY_CARE_PROVIDER_SITE_OTHER): Payer: Medicare Other | Admitting: Family Medicine

## 2015-08-25 VITALS — BP 136/86 | HR 67 | Temp 98.2°F | Ht 69.0 in | Wt 203.4 lb

## 2015-08-25 DIAGNOSIS — R1013 Epigastric pain: Secondary | ICD-10-CM

## 2015-08-25 DIAGNOSIS — R109 Unspecified abdominal pain: Secondary | ICD-10-CM

## 2015-08-25 DIAGNOSIS — I10 Essential (primary) hypertension: Secondary | ICD-10-CM | POA: Insufficient documentation

## 2015-08-25 DIAGNOSIS — M791 Myalgia, unspecified site: Secondary | ICD-10-CM

## 2015-08-25 DIAGNOSIS — J320 Chronic maxillary sinusitis: Secondary | ICD-10-CM | POA: Diagnosis not present

## 2015-08-25 DIAGNOSIS — F419 Anxiety disorder, unspecified: Secondary | ICD-10-CM | POA: Insufficient documentation

## 2015-08-25 DIAGNOSIS — G8929 Other chronic pain: Secondary | ICD-10-CM

## 2015-08-25 LAB — COMPREHENSIVE METABOLIC PANEL
ALBUMIN: 4 g/dL (ref 3.5–5.2)
ALK PHOS: 50 U/L (ref 39–117)
ALT: 11 U/L (ref 0–53)
AST: 17 U/L (ref 0–37)
BILIRUBIN TOTAL: 0.6 mg/dL (ref 0.2–1.2)
BUN: 14 mg/dL (ref 6–23)
CO2: 29 mEq/L (ref 19–32)
Calcium: 9.6 mg/dL (ref 8.4–10.5)
Chloride: 102 mEq/L (ref 96–112)
Creatinine, Ser: 1.14 mg/dL (ref 0.40–1.50)
GFR: 66.56 mL/min (ref >60.00)
GLUCOSE: 98 mg/dL (ref 70–99)
Potassium: 4.4 mEq/L (ref 3.5–5.1)
Sodium: 138 mEq/L (ref 135–145)
TOTAL PROTEIN: 6.9 g/dL (ref 6.0–8.3)

## 2015-08-25 LAB — CBC
HCT: 36.1 % — ABNORMAL LOW (ref 39.0–52.0)
Hemoglobin: 11.9 g/dL — ABNORMAL LOW (ref 13.0–17.0)
MCHC: 32.8 g/dL (ref 30.0–36.0)
MCV: 87.6 fl (ref 78.0–100.0)
PLATELETS: 260 10*3/uL (ref 150.0–400.0)
RBC: 4.12 Mil/uL — ABNORMAL LOW (ref 4.22–5.81)
RDW: 14.3 % (ref 11.5–15.5)
WBC: 6 10*3/uL (ref 4.0–10.5)

## 2015-08-25 LAB — CK: CK TOTAL: 190 U/L (ref 7–232)

## 2015-08-25 LAB — LIPASE: Lipase: 28 U/L (ref 11.0–59.0)

## 2015-08-25 MED ORDER — SERTRALINE HCL 50 MG PO TABS: 50.0000 mg | ORAL_TABLET | Freq: Every day | ORAL | Status: DC

## 2015-08-25 MED ORDER — OMEPRAZOLE 40 MG PO CPDR: 40.0000 mg | DELAYED_RELEASE_CAPSULE | Freq: Every day | ORAL | Status: DC

## 2015-08-25 NOTE — Progress Notes (Signed)
Pre visit review using our clinic review tool, if applicable. No additional management support is needed unless otherwise documented below in the visit note. 

## 2015-08-25 NOTE — Patient Instructions (Signed)
Nice to see you. Please continue your blood pressure medications. Please monitor your blood pressure at home, if it is >150/90 please let us know. Please take the omeprazole daily for 2 weeks then stop it and see how you do.  We'll check lab work today to determine the next step in management. We will increase your Zoloft to 50 mg daily. If you develop chest pain, shortness of breath, numbness, weakness, vision changes, abdominal pain, nausea, vomiting, diarrhea, worsening anxiety, depression, thoughts of harming herself or others, or any new or changing symptoms please seek medical attention.

## 2015-08-25 NOTE — Progress Notes (Signed)
Patient ID: Cameron Perna., male   DOB: 22-Aug-1940, 75 y.o.   MRN: [id number]  [name], MD Phone: [phone]  Cameron Pigeon [place] Null. is a 75 y.o. male who presents today for f/u.  HYPERTENSION Disease Monitoring Home BP Monitoring checking intermittently. Has been as high as 200/110. Restarted on his Norvasc and came back down in the normal range.  he did not have any symptoms of headache, numbness, weakness, vision changes, chest pain, or shortness of breath when his blood pressure was that high.  Chest pain- no    Dyspnea- no Medications Compliance-  taking amlodipine, atenolol, lisinopril, and Flomax. Edema- no  Patient notes continued intermittent stomach discomfort. He saw the surgeon for his diverticulitis and it appears that there is no intervention planned at this time. He does note occasional sharp epigastric discomfort intermittently if he feels nervous. Can last all night. He does take omeprazole although is unsure if this helps.  He has also seen ear nose and throat for sinus issues. States they did not see anything wrong when they looked at them. They started him on Flonase. He notes this has helped.  Right leg continues to bother him intermittently. No pain at this time. Hurts at random times. It is not exertional pain. Occasionally does get some swelling. Prior CK levels have been mildly elevated.  Anxiety: Patient notes this has improved recently as his wife has started to feel better. Anxiety comes out of nowhere sometimes that was mostly associated with the times that his wife has difficulty. He notes Zoloft has been beneficial. No depression.  PMH: Former smoker.    ROS see history of present illness  Objective  Physical Exam Filed Vitals:   08/25/15 0800  BP: 136/86  Pulse: 67  Temp: 98.2 F (36.8 C)    BP Readings from Last 3 Encounters:  08/25/15 136/86  08/20/15 120/68  08/20/15 126/78   Wt Readings from Last 3 Encounters:  08/25/15 203 lb 6.4  oz (92.262 kg)  08/20/15 205 lb (92.987 kg)  08/13/15 205 lb 11.2 oz (93.305 kg)    Physical Exam  Constitutional: He is well-developed, well-nourished, and in no distress.  HENT:  Head: Normocephalic and atraumatic.  Right Ear: External ear normal.  Left Ear: External ear normal.  Mouth/Throat: Oropharynx is clear and moist. No oropharyngeal exudate.  Eyes: Conjunctivae are normal. Pupils are equal, round, and reactive to light.  Cardiovascular: Normal rate, regular rhythm and normal heart sounds.   Pulmonary/Chest: Effort normal and breath sounds normal.  Abdominal: Soft. Bowel sounds are normal. He exhibits no distension. There is no tenderness. There is no rebound and no guarding.  Musculoskeletal:  Minimal swelling of right leg compared to left stable from previously, leg is minimally tender over the anterior aspect of his shin, no other muscular tenderness, no calf tenderness or cords  Neurological: He is alert.  CN 2-12 intact, 5/5 strength in bilateral biceps, triceps, grip, quads, hamstrings, plantar and dorsiflexion, sensation to light touch intact in bilateral UE and LE, normal gait, 2+ patellar reflexes  Skin: Skin is warm and dry. He is not diaphoretic.     Assessment/Plan: Please see individual problem list.  Chronic sinus infection Evaluated by ENT. On Flonase. No symptoms at this time. Continue Flonase.  Myalgia Continues to be an intermittent issue. We'll recheck CK. If his CK remains elevated will refer to rheumatology. If CK is normal we will refer to vascular surgery for further evaluation. Given return precautions.  Essential hypertension At goal today after restarting amlodipine. He'll continue to monitor. Given return precautions.  Chronic abdominal pain Patient with chronic intermittent abdominal pain. More recently epigastric. Benign abdominal exam today. Discomfort in left lower quadrant resolved following treatment for diverticulitis. He has been  evaluated by general surgery for this. We will refill his omeprazole. We will check labs as outlined below. If continues to have abdominal pain will need to refer to GI for further evaluation. Given return precautions.  Anxiety Improving. We'll increase Zoloft to 50 mg daily. Given return precautions.    Orders Placed This Encounter  Procedures  . Comp Met (CMET)  . CBC  . Lipase  . CK (Creatine Kinase)    Meds ordered this encounter  Medications  . sertraline (ZOLOFT) 50 MG tablet    Sig: Take 1 tablet (50 mg total) by mouth daily.    Dispense:  90 tablet    Refill:  3  . omeprazole (PRILOSEC) 40 MG capsule    Sig: Take 1 capsule (40 mg total) by mouth daily.    Dispense:  30 capsule    Refill:  0    [name], MD [place]

## 2015-08-25 NOTE — Assessment & Plan Note (Signed)
Improving. We'll increase Zoloft to 50 mg daily. Given return precautions.

## 2015-08-25 NOTE — Assessment & Plan Note (Signed)
At goal today after restarting amlodipine. He'll continue to monitor. Given return precautions.

## 2015-08-25 NOTE — Assessment & Plan Note (Signed)
Patient with chronic intermittent abdominal pain. More recently epigastric. Benign abdominal exam today. Discomfort in left lower quadrant resolved following treatment for diverticulitis. He has been evaluated by general surgery for this. We will refill his omeprazole. We will check labs as outlined below. If continues to have abdominal pain will need to refer to GI for further evaluation. Given return precautions.

## 2015-08-25 NOTE — Assessment & Plan Note (Signed)
Continues to be an intermittent issue. We'll recheck CK. If his CK remains elevated will refer to rheumatology. If CK is normal we will refer to vascular surgery for further evaluation. Given return precautions.

## 2015-08-25 NOTE — Assessment & Plan Note (Signed)
Evaluated by ENT. On Flonase. No symptoms at this time. Continue Flonase.

## 2015-08-26 ENCOUNTER — Other Ambulatory Visit: Payer: Self-pay | Admitting: Family Medicine

## 2015-08-26 ENCOUNTER — Encounter: Payer: Self-pay | Admitting: Family Medicine

## 2015-08-26 DIAGNOSIS — M7989 Other specified soft tissue disorders: Secondary | ICD-10-CM

## 2015-08-28 ENCOUNTER — Encounter: Payer: Self-pay | Admitting: Family Medicine

## 2015-08-28 ENCOUNTER — Other Ambulatory Visit: Payer: Self-pay | Admitting: Family Medicine

## 2015-08-28 ENCOUNTER — Ambulatory Visit: Payer: PRIVATE HEALTH INSURANCE | Admitting: Family Medicine

## 2015-08-28 MED ORDER — SERTRALINE HCL 50 MG PO TABS: 50.0000 mg | ORAL_TABLET | Freq: Every day | ORAL | Status: DC

## 2015-09-10 ENCOUNTER — Ambulatory Visit: Payer: PRIVATE HEALTH INSURANCE | Admitting: Family Medicine

## 2015-09-16 DIAGNOSIS — I70211 Atherosclerosis of native arteries of extremities with intermittent claudication, right leg: Secondary | ICD-10-CM | POA: Diagnosis not present

## 2015-09-16 DIAGNOSIS — M79609 Pain in unspecified limb: Secondary | ICD-10-CM | POA: Diagnosis not present

## 2015-09-16 DIAGNOSIS — I10 Essential (primary) hypertension: Secondary | ICD-10-CM | POA: Diagnosis not present

## 2015-09-16 DIAGNOSIS — M7989 Other specified soft tissue disorders: Secondary | ICD-10-CM | POA: Diagnosis not present

## 2015-09-18 ENCOUNTER — Encounter: Payer: Self-pay | Admitting: Cardiovascular Disease

## 2015-09-18 ENCOUNTER — Ambulatory Visit (INDEPENDENT_AMBULATORY_CARE_PROVIDER_SITE_OTHER): Payer: Medicare Other | Admitting: Cardiovascular Disease

## 2015-09-18 VITALS — BP 110/72 | HR 61 | Ht 69.0 in | Wt 203.8 lb

## 2015-09-18 DIAGNOSIS — R0600 Dyspnea, unspecified: Secondary | ICD-10-CM

## 2015-09-18 DIAGNOSIS — I10 Essential (primary) hypertension: Secondary | ICD-10-CM

## 2015-09-18 NOTE — Patient Instructions (Addendum)
Medication Instructions:  Your physician recommends that you continue on your current medications as directed. Please refer to the Current Medication list given to you today.   Labwork: none  Testing/Procedures: Your physician has requested that you have an echocardiogram. Echocardiography is a painless test that uses sound waves to create images of your heart. It provides your doctor with information about the size and shape of your heart and how well your heart's chambers and valves are working. This procedure takes approximately one hour. There are no restrictions for this procedure.  Your physician has requested that you have a lexiscan myoview. For further information please visit [email or web address]. Please follow instruction sheet, as given.  [place]  Your caregiver has ordered a Stress Test with nuclear imaging. The purpose of this test is to evaluate the blood supply to your heart muscle. This procedure is referred to as a "Non-Invasive Stress Test." This is because other than having an IV started in your vein, nothing is inserted or "invades" your body. Cardiac stress tests are done to find areas of poor blood flow to the heart by determining the extent of coronary artery disease (CAD). Some patients exercise on a treadmill, which naturally increases the blood flow to your heart, while others who are  unable to walk on a treadmill due to physical limitations have a pharmacologic/chemical stress agent called Lexiscan . This medicine will mimic walking on a treadmill by temporarily increasing your coronary blood flow.   Please note: these test may take anywhere between 2-4 hours to complete  PLEASE REPORT TO [facility] AT THE FIRST DESK WILL DIRECT YOU WHERE TO GO  Date of Procedure:____Friday, June 16_________  Arrival Time for Procedure:___8:15am_________________  Instructions regarding medication:   _xx___:  Hold atenolol the morning of your  test.    PLEASE NOTIFY THE OFFICE AT LEAST 24 HOURS IN ADVANCE IF YOU ARE UNABLE TO KEEP YOUR APPOINTMENT.  [phone] AND  PLEASE NOTIFY NUCLEAR MEDICINE AT [facility] AT LEAST 24 HOURS IN ADVANCE IF YOU ARE UNABLE TO KEEP YOUR APPOINTMENT. [phone]  How to prepare for your Myoview test:   Do not eat or drink after midnight  No caffeine for 24 hours prior to test  No smoking 24 hours prior to test.  Your medication may be taken with water.  If your doctor stopped a medication because of this test, do not take that medication.  Ladies, please do not wear dresses.  Skirts or pants are appropriate. Please wear a short sleeve shirt.  No perfume, cologne or lotion.  Wear comfortable walking shoes. No heels!            Follow-Up: Your physician recommends that you schedule a follow-up appointment as needed.    Any Other Special Instructions Will Be Listed Below (If Applicable).     If you need a refill on your cardiac medications before your next appointment, please call your pharmacy.  Echocardiogram An echocardiogram, or echocardiography, uses sound waves (ultrasound) to produce an image of your heart. The echocardiogram is simple, painless, obtained within a short period of time, and offers valuable information to your health care provider. The images from an echocardiogram can provide information such as:  Evidence of coronary artery disease (CAD).  Heart size.  Heart muscle function.  Heart valve function.  Aneurysm detection.  Evidence of a past heart attack.  Fluid buildup around the heart.  Heart muscle thickening.  Assess heart valve function. [place]  CARE PROVIDER KNOW ABOUT:  Any allergies you have.  All medicines you are taking, including vitamins, herbs, eye drops, creams, and over-the-counter medicines.  Previous problems you or members of your family have had with the use of anesthetics.  Any blood disorders you have.  Previous  surgeries you have had.  Medical conditions you have.  Possibility of pregnancy, if this applies. BEFORE THE PROCEDURE  No special preparation is needed. Eat and drink normally.  PROCEDURE   In order to produce an image of your heart, gel will be applied to your chest and a wand-like tool (transducer) will be moved over your chest. The gel will help transmit the sound waves from the transducer. The sound waves will harmlessly bounce off your heart to allow the heart images to be captured in real-time motion. These images will then be recorded.  You may need an IV to receive a medicine that improves the quality of the pictures. AFTER THE PROCEDURE You may return to your normal schedule including diet, activities, and medicines, unless your health care provider tells you otherwise.   This information is not intended to replace advice given to you by your health care provider. Make sure you discuss any questions you have with your health care provider.   Document Released: 04/01/2000 Document Revised: 04/25/2014 Document Reviewed: 12/10/2012 Elsevier Interactive Patient Education 2016 [place]. Cardiac Nuclear Scanning A cardiac nuclear scan is used to check your heart for problems, such as the following:  A portion of the heart is not getting enough blood.  Part of the heart muscle has died, which happens with a heart attack.  The heart wall is not working normally.  In this test, a radioactive dye (tracer) is injected into your bloodstream. After the tracer has traveled to your heart, a scanning device is used to measure how much of the tracer is absorbed by or distributed to various areas of your heart. LET [facility] CARE PROVIDER KNOW ABOUT:  Any allergies you have.  All medicines you are taking, including vitamins, herbs, eye drops, creams, and over-the-counter medicines.  Previous problems you or members of your family have had with the use of anesthetics.  Any blood  disorders you have.  Previous surgeries you have had.  Medical conditions you have.  RISKS AND COMPLICATIONS Generally, this is a safe procedure. However, as with any procedure, problems can occur. Possible problems include:   Serious chest pain.  Rapid heartbeat.  Sensation of warmth in your chest. This usually passes quickly. BEFORE THE PROCEDURE Ask your health care provider about changing or stopping your regular medicines. PROCEDURE This procedure is usually done at a hospital and takes 2-4 hours.  An IV tube is inserted into one of your veins.  Your health care provider will inject a small amount of radioactive tracer through the tube.  You will then wait for 20-40 minutes while the tracer travels through your bloodstream.  You will lie down on an exam table so images of your heart can be taken. Images will be taken for about 15-20 minutes.  You will exercise on a treadmill or stationary bike. While you exercise, your heart activity will be monitored with an electrocardiogram (ECG), and your blood pressure will be checked.  If you are unable to exercise, you may be given a medicine to make your heart beat faster.  When blood flow to your heart has peaked, tracer will again be injected through the IV tube.  After 20-40 minutes, you will  get back on the exam table and have more images taken of your heart.  When the procedure is over, your IV tube will be removed. AFTER THE PROCEDURE  You will likely be able to leave shortly after the test. Unless your health care provider tells you otherwise, you may return to your normal schedule, including diet, activities, and medicines.  Make sure you find out how and when you will get your test results.   This information is not intended to replace advice given to you by your health care provider. Make sure you discuss any questions you have with your health care provider.   Document Released: 04/29/2004 Document Revised:  04/09/2013 Document Reviewed: 03/13/2013 Elsevier Interactive Patient Education [employer].

## 2015-09-18 NOTE — Progress Notes (Signed)
Cardiology Office Note   Date:  09/18/2015   ID:  Cameron Purk., DOB 1940-10-28, MRN [id number]  PCP:  [name], MD  Cardiologist:   Kathlyn Sacramento, MD   Chief Complaint  Patient presents with  . other    C/o fatigue with exertion and sob. Meds reviewed verbally with pt.      History of Present Illness: Cameron Foster. is a 75 y.o. male who was referred by Dr. Caryl Bis for evaluation of exertional dyspnea. The patient reports prior history of arrhythmia in the late 90s in the setting of electrolyte abnormalities. He remembers having cardiac catheterization done at that time and [place] and was told that there was no significant blockages. He has chronic medical conditions that include hypertension and hyperlipidemia. He does not smoke but does dip. There is no family history of coronary artery disease. He is retired from [employer]. He has experienced progressive symptoms of exertional dyspnea currently happening with minimal activities. This is not associated with chest pain. No orthopnea or PND. He does have chronic leg edema worse on the right side. He also complains of right leg pain with walking with significant joint pain. He is scheduled to have ABI. He has not been active and does not exercise regularly.   Past Medical History  Diagnosis Date  . Alcoholism ([place])   . Arthritis   . Diverticulitis   . Fainting   . Hypertension   . Hyperlipidemia   . Colon polyp   . Vertigo   . Atrial fibrillation ([place])     one episode  . History of blood clots   . Irritable bowel syndrome   . Diverticulosis 30 years  . Prostate cancer ([place]) 2012    treated with radiation therapy.  . Arrhythmia     Past Surgical History  Procedure Laterality Date  . Knee arthroscopy    . Prostate surgery      Microwave therapy  . Colonoscopy  2016  . Cardiac catheterization      [place] no stents     Current Outpatient Prescriptions  Medication Sig Dispense  Refill  . amLODipine (NORVASC) 5 MG tablet Take 5 mg by mouth daily.    Marland Kitchen atenolol (TENORMIN) 25 MG tablet Take 25 mg by mouth once daily.    . hydrocortisone 2.5 % cream Apply topically 2 (two) times daily. 30 g 1  . lisinopril (PRINIVIL,ZESTRIL) 10 MG tablet Take 10 mg by mouth once daily.    . Multiple Vitamin (MULTIVITAMIN) tablet Take 1 tablet by mouth daily.    Marland Kitchen omeprazole (PRILOSEC) 40 MG capsule Take 1 capsule (40 mg total) by mouth daily. 30 capsule 0  . polyethylene glycol powder (GLYCOLAX/MIRALAX) powder Take 17 g by mouth 2 (two) times daily as needed. 3350 g 1  . sennosides-docusate sodium (SENOKOT-S) 8.6-50 MG tablet Take 2 tablets by mouth every morning.    . sertraline (ZOLOFT) 50 MG tablet Take 1 tablet (50 mg total) by mouth daily. 90 tablet 3   No current facility-administered medications for this visit.    Allergies:   Formaldehyde    Social History:  The patient  reports that he quit smoking about 27 years ago. His smokeless tobacco use includes Chew. He reports that he drinks about 0.6 oz of alcohol per week. He reports that he does not use illicit drugs.   Family History:  The patient's family history includes Bipolar disorder in his son; Lung cancer in his  father; Sudden death in his son. There is no history of Prostate cancer or Bladder Cancer.    ROS:  Please see the history of present illness.   Otherwise, review of systems are positive for none.   All other systems are reviewed and negative.    PHYSICAL EXAM: VS:  BP 110/72 mmHg  Pulse 61  Ht 5' 9"  (1.753 m)  Wt 203 lb 12 oz (92.42 kg)  BMI 30.07 kg/m2 , BMI Body mass index is 30.07 kg/(m^2). GEN: Well nourished, well developed, in no acute distress HEENT: normal Neck: no JVD, carotid bruits, or masses Cardiac: RRR; no murmurs, rubs, or gallops,no edema  Respiratory:  clear to auscultation bilaterally, normal work of breathing GI: soft, nontender, nondistended, + BS MS: no deformity or atrophy Skin:  warm and dry, no rash Neuro:  Strength and sensation are intact Psych: euthymic mood, full affect   EKG:  EKG is ordered today. The ekg ordered today demonstrates  Normal sinus rhythm, LVH with nonspecific T wave changes and left axis deviation.    Recent Labs: 01/20/2015: TSH 1.88 08/12/2015: Brain Natriuretic Peptide 117.3* 08/25/2015: ALT 11; BUN 14; Creatinine, Ser 1.14; Hemoglobin 11.9*; Platelets 260.0; Potassium 4.4; Sodium 138    Lipid Panel No results found for: CHOL, TRIG, HDL, CHOLHDL, VLDL, LDLCALC, LDLDIRECT    Wt Readings from Last 3 Encounters:  09/18/15 203 lb 12 oz (92.42 kg)  08/25/15 203 lb 6.4 oz (92.262 kg)  08/20/15 205 lb (92.987 kg)        ASSESSMENT AND PLAN:  1.   Exertional dyspnea: Could be angina equivalent with multiple risk factors for coronary artery disease. It could also be due to to physical deconditioning given that he hasn't been active for a long time. I requested a pharmacologic nuclear stress test for evaluation. He is not able to exercise on a treadmill due to his right leg pain. I also requested an echocardiogram to evaluate diastolic function and pulmonary pressure.  2. Right leg pain: I doubt that this is due to to peripheral arterial disease. His distal pulses are palpable.  3. Essential hypertension: Blood pressure is controlled on current medications.    Disposition:   FU with me prn  Signed,  Kathlyn Sacramento, MD  09/18/2015 5:06 PM    [place]

## 2015-09-25 ENCOUNTER — Telehealth: Payer: Self-pay | Admitting: Cardiovascular Disease

## 2015-09-25 NOTE — Telephone Encounter (Signed)
° °  Patient forgot to tell you at ov :  Patient has vaso vagal attacks  3 years ago HYPOtension issues while on Atenolol and lisinopril  Went to MD about this and was but on amplodipine to regulate things   Patient wanted to let us know not sure if this matters as far as tx plan and testing

## 2015-09-25 NOTE — Telephone Encounter (Signed)
Left message on machine for patient to contact the office.   

## 2015-09-30 NOTE — Telephone Encounter (Signed)
S/w pt to review myoview instructions. Pt verbalized understanding and is agreeable w/plan. Pt states he had an "episode" three years ago when he became hypotensive while taking atenolol and lisinopril. Denies an recent sx. States he takes lisinopril and atenolol in the morning and amlodipine in the evening. He does not take his BP regularly and unable to provide any readings. Advised pt to purchase BP cuff and document BP readings daily. Monitor sx and report any low readings or sx to [place]. Pt verbalized understanding with no further questions.

## 2015-10-01 ENCOUNTER — Encounter: Payer: Self-pay | Admitting: Family Medicine

## 2015-10-01 ENCOUNTER — Ambulatory Visit (INDEPENDENT_AMBULATORY_CARE_PROVIDER_SITE_OTHER): Payer: Medicare Other | Admitting: Family Medicine

## 2015-10-01 VITALS — BP 110/66 | HR 66 | Temp 98.6°F | Ht 69.0 in | Wt 204.2 lb

## 2015-10-01 DIAGNOSIS — G8929 Other chronic pain: Secondary | ICD-10-CM | POA: Diagnosis not present

## 2015-10-01 DIAGNOSIS — K5732 Diverticulitis of large intestine without perforation or abscess without bleeding: Secondary | ICD-10-CM

## 2015-10-01 DIAGNOSIS — R109 Unspecified abdominal pain: Secondary | ICD-10-CM | POA: Diagnosis not present

## 2015-10-01 DIAGNOSIS — R0600 Dyspnea, unspecified: Secondary | ICD-10-CM

## 2015-10-01 DIAGNOSIS — R0609 Other forms of dyspnea: Secondary | ICD-10-CM

## 2015-10-01 DIAGNOSIS — M7989 Other specified soft tissue disorders: Secondary | ICD-10-CM | POA: Diagnosis not present

## 2015-10-01 DIAGNOSIS — M1711 Unilateral primary osteoarthritis, right knee: Secondary | ICD-10-CM

## 2015-10-01 NOTE — Progress Notes (Signed)
Patient ID: Cameron Perna., male   DOB: Feb 18, 1941, 75 y.o.   MRN: [id number]  Cameron Rumps, MD Phone: [phone]  Cameron Pigeon Murtaza Shell. is a 75 y.o. male who presents today for follow-up.  Exertional dyspnea: Patient notes this is improved. If he walks a long distance such as 100 yards or more he will often puff a little bit. He saw cardiology for this. They're planning a stress test tomorrow and an echo next week. No chest pain.  Right leg pain: Patient notes this is significantly better. Occasionally hurts a not very frequently. Still with swelling. Saw vascular surgery and they're planning to do ABIs and possibly further testing.  Abdominal pain: Patient notes this is quite a bit better. He is taking Senokot to have bowel movements daily. Occasionally takes MiraLAX. Notes 1-2 bowel movements daily. Did see a general surgeon given his recurrent history of diverticulitis and states no surgery was needed. He does note he did receive a letter from his GI physician in [place] noting that he was due for colonoscopy despite having a colonoscopy 2 years ago.  Right knee pain: Patient notes occasionally this bothers him. Occasionally will lock up. Does not give out. He has had injections previously that will help for 1-2 months. He would like to see orthopedic surgeon for this.  PMH: Former smoker.   ROS see history of present illness  Objective  Physical Exam Filed Vitals:   10/01/15 1331  BP: 110/66  Pulse: 66  Temp: 98.6 F (37 C)    BP Readings from Last 3 Encounters:  10/01/15 110/66  09/18/15 110/72  08/25/15 136/86   Wt Readings from Last 3 Encounters:  10/01/15 204 lb 3.2 oz (92.625 kg)  09/18/15 203 lb 12 oz (92.42 kg)  08/25/15 203 lb 6.4 oz (92.262 kg)    Physical Exam  Constitutional: He is well-developed, well-nourished, and in no distress.  HENT:  Head: Normocephalic and atraumatic.  Right Ear: External ear normal.  Left Ear: External ear normal.    Cardiovascular: Normal rate, regular rhythm and normal heart sounds.   Right lower extremity with apparent swelling nonpitting, left lower extremity with no swelling  Pulmonary/Chest: Effort normal and breath sounds normal.  Abdominal: Soft. Bowel sounds are normal. He exhibits no distension. There is no tenderness. There is no rebound and no guarding.  Musculoskeletal:  Bilateral knee is nontender with no swelling or erythema, no ligament laxity, negative McMurray's  Neurological: He is alert. Gait normal.  Skin: Skin is warm and dry. He is not diaphoretic.     Assessment/Plan: Please see individual problem list.  Chronic abdominal pain Improved with treatment of his constipation. Benign abdominal exam. Encouraged continued use of Senokot and MiraLAX as needed. Given return precautions.  Diverticulitis of colon Benign abdominal exam. Monitor for recurrence. Given return precautions.  Exertional dyspnea Improved, though not resolved. He will proceed with the stress test and echo as ordered by cardiology. Continue to follow with them. If not cardiac in nature would suspect deconditioning. Given return precautions.  Swelling of right lower extremity Chronic issue. Somewhat improved. Pain is less. Being followed by vascular surgery at this time. Planning to do ABIs and potential other investigation. Continue to monitor.  Osteoarthritis of right knee Patient reports history of osteoarthritis in his right knee and states this is bone-on-bone. Has had several injections in the past that have helped for several months. He is requesting to see an orthopedic surgeon for this. Referral has been placed.  Orders Placed This Encounter  Procedures  . Ambulatory referral to Orthopedic Surgery    Referral Priority:  Routine    Referral Type:  Surgical    Referral Reason:  Specialty Services Required    Requested Specialty:  Orthopedic Surgery    Number of Visits Requested:  1    No orders  of the defined types were placed in this encounter.    Patient additionally notes received a letter that he needed a colonoscopy. He reports having had a colonoscopy 2 years ago. He will send a message to [place] with the name of the GI physician that he saw previously so we can request records to ensure this is correct.   Cameron Rumps, MD [place]

## 2015-10-01 NOTE — Assessment & Plan Note (Signed)
Improved with treatment of his constipation. Benign abdominal exam. Encouraged continued use of Senokot and MiraLAX as needed. Given return precautions.

## 2015-10-01 NOTE — Progress Notes (Signed)
Pre visit review using our clinic review tool, if applicable. No additional management support is needed unless otherwise documented below in the visit note. 

## 2015-10-01 NOTE — Assessment & Plan Note (Addendum)
Patient reports history of osteoarthritis in his right knee and states this is bone-on-bone. Has had several injections in the past that have helped for several months. He is requesting to see an orthopedic surgeon for this. Referral has been placed.

## 2015-10-01 NOTE — Assessment & Plan Note (Addendum)
Improved, though not resolved. He will proceed with the stress test and echo as ordered by cardiology. Continue to follow with them. If not cardiac in nature would suspect deconditioning. Given return precautions.

## 2015-10-01 NOTE — Patient Instructions (Signed)
Nice to see you. I'm glad you're doing so well. Please keep your cardiology appointments. Please follow up with the vascular surgeon. Please continue the Senokot and MiraLAX as needed for bowel movements. We will refer you to orthopedics for your right knee. If you develop chest pain, shortness of breath, leg pain, abdominal pain, blood in your stool, or any new or changing symptoms please seek medical attention.

## 2015-10-01 NOTE — Assessment & Plan Note (Signed)
Chronic issue. Somewhat improved. Pain is less. Being followed by vascular surgery at this time. Planning to do ABIs and potential other investigation. Continue to monitor.

## 2015-10-01 NOTE — Assessment & Plan Note (Addendum)
Benign abdominal exam. Monitor for recurrence. Given return precautions.

## 2015-10-02 ENCOUNTER — Encounter
Admission: RE | Admit: 2015-10-02 | Discharge: 2015-10-02 | Disposition: A | Discharge status: Home / Self Care | Payer: Medicare Other | Source: Ambulatory Visit | Attending: Cardiovascular Disease | Admitting: Cardiovascular Disease

## 2015-10-02 ENCOUNTER — Encounter: Payer: Self-pay | Admitting: Family Medicine

## 2015-10-02 DIAGNOSIS — R0600 Dyspnea, unspecified: Secondary | ICD-10-CM | POA: Insufficient documentation

## 2015-10-02 LAB — NM MYOCAR MULTI W/SPECT W/WALL MOTION / EF
CHL CUP NUCLEAR SRS: 2
CHL CUP RESTING HR STRESS: 62 {beats}/min
CHL CUP STRESS STAGE 2 GRADE: 0 %
CHL CUP STRESS STAGE 3 SPEED: 0 mph
CHL CUP STRESS STAGE 4 GRADE: 0 %
CHL CUP STRESS STAGE 6 GRADE: 0 %
CHL CUP STRESS STAGE 6 SPEED: 0 mph
CSEPEW: 1 METS
CSEPHR: 64 %
LV sys vol: 52 mL
LVDIAVOL: 106 mL (ref 62–150)
Peak HR: 92 {beats}/min
Percent of predicted max HR: 63 %
SDS: 2
SSS: 1
Stage 1 HR: 69 {beats}/min
Stage 2 HR: 67 {beats}/min
Stage 2 Speed: 0 mph
Stage 3 Grade: 0 %
Stage 3 HR: 67 {beats}/min
Stage 4 HR: 92 {beats}/min
Stage 4 Speed: 0 mph
Stage 5 DBP: 80 mmHg
Stage 5 Grade: 0 %
Stage 5 HR: 82 {beats}/min
Stage 5 SBP: 137 mmHg
Stage 5 Speed: 0 mph
Stage 6 DBP: 80 mmHg
Stage 6 HR: 77 {beats}/min
Stage 6 SBP: 137 mmHg
TID: 1.05

## 2015-10-02 MED ORDER — TECHNETIUM TC 99M TETROFOSMIN IV KIT
32.6480 | PACK | Freq: Once | INTRAVENOUS | Status: AC | PRN
  Administered 2015-10-02: 32.648 via INTRAVENOUS

## 2015-10-02 MED ORDER — TECHNETIUM TC 99M TETROFOSMIN IV KIT
13.0000 | PACK | Freq: Once | INTRAVENOUS | Status: AC | PRN
  Administered 2015-10-02: 13.32 via INTRAVENOUS

## 2015-10-02 MED ORDER — REGADENOSON 0.4 MG/5ML IV SOLN
0.4000 mg | Freq: Once | INTRAVENOUS | Status: AC
  Administered 2015-10-02: 0.4 mg via INTRAVENOUS

## 2015-10-02 MED ORDER — REGADENOSON 0.4 MG/5ML IV SOLN
0.4000 mg | Freq: Once | INTRAVENOUS | Status: DC
  Filled 2015-10-02: qty 5

## 2015-10-05 ENCOUNTER — Ambulatory Visit (INDEPENDENT_AMBULATORY_CARE_PROVIDER_SITE_OTHER): Payer: Medicare Other

## 2015-10-05 ENCOUNTER — Other Ambulatory Visit: Payer: Self-pay

## 2015-10-05 DIAGNOSIS — R0600 Dyspnea, unspecified: Secondary | ICD-10-CM | POA: Diagnosis not present

## 2015-10-05 LAB — ECHOCARDIOGRAM COMPLETE
AVPHT: 1227 ms
CHL CUP REG VEL DIAS: 63.8 cm/s
EERAT: 11.59
EWDT: 194 ms
FS: 37 % (ref 28–44)
IV/PV OW: 0.81
LA diam end sys: 38 mm
LA vol index: 34.5 mL/m2
LA vol: 74.1 mL
LADIAMINDEX: 1.77 cm/m2
LASIZE: 38 mm
LAVOLA4C: 73 mL
LV E/e' medial: 11.59
LV PW d: 12.9 mm — AB (ref 0.6–1.1)
LV SIMPSON'S DISK: 59
LV TDI E'LATERAL: 8.16
LV TDI E'MEDIAL: 6.85
LV dias vol index: 49 mL/m2
LV e' LATERAL: 8.16 cm/s
LVDIAVOL: 106 mL (ref 62–150)
LVEEAVG: 11.59
LVOT SV: 77 mL
LVOT VTI: 22.2 cm
LVOT area: 3.46 cm2
LVOT peak vel: 103 cm/s
LVOTD: 21 mm
LVSYSVOL: 44 mL (ref 21–61)
LVSYSVOLIN: 20 mL/m2
MV Dec: 194
MV Peak grad: 4 mmHg
MVPKAVEL: 86.3 m/s
MVPKEVEL: 94.6 m/s
Stroke v: 62 ml
TAPSE: 18.9 mm

## 2015-10-06 DIAGNOSIS — M1711 Unilateral primary osteoarthritis, right knee: Secondary | ICD-10-CM | POA: Diagnosis not present

## 2015-10-06 DIAGNOSIS — M170 Bilateral primary osteoarthritis of knee: Secondary | ICD-10-CM | POA: Diagnosis not present

## 2015-10-07 ENCOUNTER — Telehealth: Payer: Self-pay | Admitting: Cardiovascular Disease

## 2015-10-07 NOTE — Telephone Encounter (Signed)
Pt would like echo results. Please call.

## 2015-10-07 NOTE — Telephone Encounter (Signed)
Echo results in MD basket.

## 2015-10-08 NOTE — Telephone Encounter (Signed)
Please see result note 

## 2015-10-22 DIAGNOSIS — M7989 Other specified soft tissue disorders: Secondary | ICD-10-CM | POA: Diagnosis not present

## 2015-10-22 DIAGNOSIS — M79609 Pain in unspecified limb: Secondary | ICD-10-CM | POA: Diagnosis not present

## 2015-10-22 DIAGNOSIS — I70211 Atherosclerosis of native arteries of extremities with intermittent claudication, right leg: Secondary | ICD-10-CM | POA: Diagnosis not present

## 2015-10-22 DIAGNOSIS — I10 Essential (primary) hypertension: Secondary | ICD-10-CM | POA: Diagnosis not present

## 2015-10-28 ENCOUNTER — Ambulatory Visit (INDEPENDENT_AMBULATORY_CARE_PROVIDER_SITE_OTHER): Payer: Medicare Other | Admitting: Family Medicine

## 2015-10-28 ENCOUNTER — Encounter: Payer: Self-pay | Admitting: Family Medicine

## 2015-10-28 VITALS — BP 130/88 | HR 71 | Temp 98.3°F | Ht 69.0 in | Wt 203.8 lb

## 2015-10-28 DIAGNOSIS — L570 Actinic keratosis: Secondary | ICD-10-CM | POA: Diagnosis not present

## 2015-10-28 DIAGNOSIS — R21 Rash and other nonspecific skin eruption: Secondary | ICD-10-CM | POA: Diagnosis not present

## 2015-10-28 DIAGNOSIS — Z85828 Personal history of other malignant neoplasm of skin: Secondary | ICD-10-CM | POA: Insufficient documentation

## 2015-10-28 MED ORDER — TRIAMCINOLONE ACETONIDE 0.1 % EX CREA: 1.0000 "application " | TOPICAL_CREAM | Freq: Two times a day (BID) | CUTANEOUS | Status: DC | PRN

## 2015-10-28 MED ORDER — METHYLPREDNISOLONE ACETATE 40 MG/ML IJ SUSP
40.0000 mg | Freq: Once | INTRAMUSCULAR | Status: AC
  Administered 2015-10-28: 40 mg via INTRAMUSCULAR

## 2015-10-28 NOTE — Progress Notes (Signed)
Pre visit review using our clinic review tool, if applicable. No additional management support is needed unless otherwise documented below in the visit note. 

## 2015-10-28 NOTE — Addendum Note (Signed)
Addended by: Caryl Bis Kassius Battiste G on: 10/28/2015 10:57 AM   Modules accepted: Orders, Level of Service

## 2015-10-28 NOTE — Progress Notes (Addendum)
Patient ID: Cameron Perna., male   DOB: Dec 08, 1940, 75 y.o.   MRN: [id number]  [name], MD Phone: [phone]  Cameron Pigeon Albion Weatherholtz. is a 75 y.o. male who presents today for same-day visit.  Rash: Patient notes this started on Sunday. He went down to the [place] visit his daughter and was [place] and stepped in what he thought was a nest of triggers. Notes scattered erythematous papules that itch on his legs, waistline, and torso. Minimal on his arms. None on his hands. No fevers. He notes he feels well overall although has had difficulty sleeping due to the itching. No new soaps or detergents. No new medications. No contacts with this. He does not think he had any exposure to bedbugs.  Patient also requests dermatology referral given history of skin cancer and several rough spots on his arms. Reports skin cancer removed from left forearm previously. Notes small erythematous rough spot has come up near this area.  ROS see history of present illness  Objective  Physical Exam Filed Vitals:   10/28/15 1023  BP: 130/88  Pulse: 71  Temp: 98.3 F (36.8 C)    BP Readings from Last 3 Encounters:  10/28/15 130/88  10/01/15 110/66  09/18/15 110/72   Wt Readings from Last 3 Encounters:  10/28/15 203 lb 12.8 oz (92.443 kg)  10/01/15 204 lb 3.2 oz (92.625 kg)  09/18/15 203 lb 12 oz (92.42 kg)    Physical Exam  Constitutional: No distress.  HENT:  Head: Normocephalic and atraumatic.  Cardiovascular: Normal rate, regular rhythm and normal heart sounds.   Pulmonary/Chest: Effort normal and breath sounds normal.  Neurological: He is alert. Gait normal.  Skin: Skin is warm and dry. He is not diaphoretic.  Scattered erythematous excoriated papules that are nontender on his lower extremities, waistline, minimal on his torso and arms, on his lower extremities mostly left popliteal fossa and bilateral lower legs, no apparent tunneling noted with the rash Scattered rough lesions on  bilateral arms most consistent with actinic keratosis     Assessment/Plan: Please see individual problem list.  Rash and nonspecific skin eruption Suspect rash related to insect bites, possibly chiggers or bedbugs. No other obvious causes. Patient is well-appearing. Vital signs are stable. Advised to wash all his clothes and sheets in hot water. Given Depo-Medrol in the office to help with itching and rash. Advised to monitor her for stomach upset with Depo-Medrol injection. Topical triamcinolone to use on most bothersome lesions. He'll continue to monitor. Given return precautions.  Actinic keratoses Patient with several new scattered apparent actinic keratoses on his bilateral arms. Does report removal of skin cancer on left arm previously. We will replace referral to dermatology for evaluation and management.    Orders Placed This Encounter  Procedures  . Ambulatory referral to Dermatology    Referral Priority:  Routine    Referral Type:  Consultation    Referral Reason:  Specialty Services Required    Requested Specialty:  Dermatology    Number of Visits Requested:  1    Meds ordered this encounter  Medications  . triamcinolone cream (KENALOG) 0.1 %    Sig: Apply 1 application topically 2 (two) times daily as needed. For itching    Dispense:  30 g    Refill:  0  . methylPREDNISolone acetate (DEPO-MEDROL) injection 40 mg    Sig:     [name], MD [place]

## 2015-10-28 NOTE — Assessment & Plan Note (Addendum)
Suspect rash related to insect bites, possibly chiggers or bedbugs. No other obvious causes. Patient is well-appearing. Vital signs are stable. Advised to wash all his clothes and sheets in hot water. Given Depo-Medrol in the office to help with itching and rash. Advised to monitor her for stomach upset with Depo-Medrol injection. Topical triamcinolone to use on most bothersome lesions. He'll continue to monitor. Given return precautions.

## 2015-10-28 NOTE — Assessment & Plan Note (Addendum)
Patient with several new scattered apparent actinic keratoses on his bilateral arms. Does report removal of skin cancer on left arm previously. We will replace referral to dermatology for evaluation and management.

## 2015-10-28 NOTE — Patient Instructions (Addendum)
Nice to see you. The rash is likely related to bug bites. This could be related to chiggers or bedbugs. It does not appear to be scabies. We treated you with a steroid injection in the office to help with her rash. We will have you use topical steroids on the most bothersome spots. If you develop fevers, spreading rash, spreading redness, or any new or change in symptoms please seek medical attention.

## 2015-11-19 DIAGNOSIS — L821 Other seborrheic keratosis: Secondary | ICD-10-CM | POA: Diagnosis not present

## 2015-11-19 DIAGNOSIS — I781 Nevus, non-neoplastic: Secondary | ICD-10-CM | POA: Diagnosis not present

## 2015-11-19 DIAGNOSIS — L300 Nummular dermatitis: Secondary | ICD-10-CM | POA: Diagnosis not present

## 2015-11-19 DIAGNOSIS — D1721 Benign lipomatous neoplasm of skin and subcutaneous tissue of right arm: Secondary | ICD-10-CM | POA: Diagnosis not present

## 2015-11-19 DIAGNOSIS — L570 Actinic keratosis: Secondary | ICD-10-CM | POA: Diagnosis not present

## 2015-12-02 ENCOUNTER — Telehealth: Payer: Self-pay | Admitting: Family Medicine

## 2015-12-02 MED ORDER — ATENOLOL 25 MG PO TABS: 25.0000 mg | ORAL_TABLET | Freq: Every day | ORAL | 3 refills | Status: DC

## 2015-12-02 MED ORDER — AMLODIPINE BESYLATE 5 MG PO TABS: 5.0000 mg | ORAL_TABLET | Freq: Every day | ORAL | 3 refills | Status: DC

## 2015-12-02 MED ORDER — LISINOPRIL 10 MG PO TABS: 10.0000 mg | ORAL_TABLET | Freq: Every day | ORAL | 3 refills | Status: DC

## 2015-12-02 NOTE — Telephone Encounter (Signed)
Sent to pharmacy 

## 2015-12-02 NOTE — Telephone Encounter (Signed)
Can we refill these?

## 2015-12-02 NOTE — Telephone Encounter (Signed)
Mr. Cameron Foster called asking that a refill for the following medications be sent to his [facility]. Currently his Provider from [place] is still listed as the prescribing physician. Please give him a phone call if needed.  1. Amlodipine 5MG - 1 per day - 49mosupply  2. Atenolol 25MG - 1 per day - 653moupply  3. Lisinopril 10MG - 1 per day - 45m14mopply  Pt's ph# [phone] Fax# (per [name]) [phone] you.

## 2016-01-01 ENCOUNTER — Ambulatory Visit (INDEPENDENT_AMBULATORY_CARE_PROVIDER_SITE_OTHER): Payer: Medicare Other | Admitting: Family Medicine

## 2016-01-01 ENCOUNTER — Encounter: Payer: Self-pay | Admitting: Family Medicine

## 2016-01-01 DIAGNOSIS — R140 Abdominal distension (gaseous): Secondary | ICD-10-CM | POA: Insufficient documentation

## 2016-01-01 DIAGNOSIS — F419 Anxiety disorder, unspecified: Secondary | ICD-10-CM | POA: Diagnosis not present

## 2016-01-01 DIAGNOSIS — I10 Essential (primary) hypertension: Secondary | ICD-10-CM

## 2016-01-01 NOTE — Assessment & Plan Note (Signed)
Well-controlled at this time. Continue Zoloft.

## 2016-01-01 NOTE — Patient Instructions (Signed)
Nice to see you. Please continue the Zoloft. Please continue your atenolol, lisinopril, and amlodipine. Please contact your pharmacy about 2 weeks prior to running out of atenolol to see if they have refills of this. If they do not have this back in stock please let us know so we can change this medication. Please look at the diet information provided to see if this will help with your bloating. If you develop abdominal pain, blood in your stool, fevers, or any new or changing symptoms please seek medical attention.

## 2016-01-01 NOTE — Assessment & Plan Note (Signed)
Patient notes intermittent abdominal bloating. Suspect he has some measure of IBS given intermittent constipation and bloating with significant gas production. I went over a fodmap diet with him. He will try to avoid the foods on this list and add back as able to. He is given return precautions.

## 2016-01-01 NOTE — Progress Notes (Signed)
Pre visit review using our clinic review tool, if applicable. No additional management support is needed unless otherwise documented below in the visit note. 

## 2016-01-01 NOTE — Progress Notes (Signed)
  Cameron Rumps, MD Phone: [phone]  Cameron Pigeon Zaydan Papesh. is a 75 y.o. male who presents today for follow-up.  HYPERTENSION  Disease Monitoring  Home BP Monitoring 130/80 Chest pain- no    Dyspnea- no Medications  Compliance-  taking atenolol, lisinopril, amlodipine.  Edema- no Patient reports that the pharmacy may have difficulty getting his atenolol for his next refill. He has enough to last him through October.  Anxiety: This is well controlled at this time. No anxiety. No depression. Is taking Zoloft and felt this is quite beneficial.  Stomach bloating: Patient notes this has intermittently been going on for a long time. Has been going on recently for the last couple of days. Started after he ate a [place] sausage with some peppers and onions. No abdominal pain with this. Having bowel movements daily. Does occasionally get constipated. Is passing gas. No blood in his stool.   PMH: Former smoker   ROS see history of present illness  Objective  Physical Exam Vitals:   01/01/16 1401  BP: 118/64  Pulse: 67  Temp: 98.3 F (36.8 C)    BP Readings from Last 3 Encounters:  01/01/16 118/64  10/28/15 130/88  10/01/15 110/66   Wt Readings from Last 3 Encounters:  01/01/16 210 lb 3.2 oz (95.3 kg)  10/28/15 203 lb 12.8 oz (92.4 kg)  10/01/15 204 lb 3.2 oz (92.6 kg)    Physical Exam  Constitutional: No distress.  Cardiovascular: Normal rate, regular rhythm and normal heart sounds.   Pulmonary/Chest: Effort normal and breath sounds normal.  Abdominal: Soft. Bowel sounds are normal. He exhibits no distension. There is no tenderness. There is no rebound and no guarding.  Skin: Skin is warm and dry. He is not diaphoretic.  Psychiatric: Mood and affect normal.     Assessment/Plan: Please see individual problem list.  Essential hypertension At goal. Continue current medications. He will let us know if his pharmacy is unable to refill his atenolol. If they are unable to we  will change this medication.  Abdominal bloating Patient notes intermittent abdominal bloating. Suspect he has some measure of IBS given intermittent constipation and bloating with significant gas production. I went over a fodmap diet with him. He will try to avoid the foods on this list and add back as able to. He is given return precautions.  Anxiety Well-controlled at this time. Continue Zoloft.   Cameron Rumps, MD [place]

## 2016-01-01 NOTE — Assessment & Plan Note (Signed)
At goal. Continue current medications. He will let us know if his pharmacy is unable to refill his atenolol. If they are unable to we will change this medication.

## 2016-01-04 ENCOUNTER — Telehealth: Payer: Self-pay | Admitting: Family Medicine

## 2016-01-04 NOTE — Telephone Encounter (Signed)
Pt called returning your call. Thank you!

## 2016-01-04 NOTE — Telephone Encounter (Signed)
Spoke with and he is wanting to change his medication. He stated that he thinks the Atenolol is causing him sinus and stomach problems.

## 2016-01-04 NOTE — Telephone Encounter (Signed)
LM for patient to return call.

## 2016-01-04 NOTE — Telephone Encounter (Signed)
Pt wanted to speak to [facility] about his atenolol (TENORMIN) 25 MG tablet. Please call him at [phone].

## 2016-01-06 NOTE — Telephone Encounter (Signed)
Please advise 

## 2016-01-06 NOTE — Telephone Encounter (Signed)
Please advise, thanks.

## 2016-01-06 NOTE — Telephone Encounter (Signed)
We can change him to metoprolol if he would like. Once he states he is ok with this I will send this in.

## 2016-01-06 NOTE — Telephone Encounter (Signed)
Pt called back and wants to speak to [place]. He states that he hasn't heard anything about his medication change yet.

## 2016-01-07 NOTE — Telephone Encounter (Signed)
Spoke with patient and he is fine with starting this medication. Please send to [place].

## 2016-01-07 NOTE — Telephone Encounter (Signed)
Patient called back to check on his prescription . I spoke with Roselyn Reef and she stated to tell the patient that it would be ready by the end of the day. I informed the patient and he stated he would check on it on tomorrow.

## 2016-01-08 MED ORDER — METOPROLOL SUCCINATE ER 25 MG PO TB24: 25.0000 mg | ORAL_TABLET | Freq: Every day | ORAL | 3 refills | Status: DC

## 2016-01-08 NOTE — Telephone Encounter (Signed)
Please place order for new medication.

## 2016-01-08 NOTE — Telephone Encounter (Signed)
Pt stated that the pharmacy did not receive the Rx  Pharmacy [facility]

## 2016-01-08 NOTE — Telephone Encounter (Signed)
Metoprolol sent in.

## 2016-01-13 DIAGNOSIS — M170 Bilateral primary osteoarthritis of knee: Secondary | ICD-10-CM | POA: Diagnosis not present

## 2016-01-18 ENCOUNTER — Encounter: Payer: Self-pay | Admitting: Family Medicine

## 2016-01-18 ENCOUNTER — Ambulatory Visit (INDEPENDENT_AMBULATORY_CARE_PROVIDER_SITE_OTHER): Payer: Medicare Other | Admitting: Family Medicine

## 2016-01-18 ENCOUNTER — Telehealth: Payer: Self-pay | Admitting: Family Medicine

## 2016-01-18 ENCOUNTER — Ambulatory Visit
Admission: RE | Admit: 2016-01-18 | Discharge: 2016-01-18 | Disposition: A | Discharge status: Home / Self Care | Payer: Medicare Other | Source: Ambulatory Visit | Attending: Family Medicine | Admitting: Family Medicine

## 2016-01-18 VITALS — BP 118/76 | HR 70 | Temp 98.5°F

## 2016-01-18 DIAGNOSIS — K5732 Diverticulitis of large intestine without perforation or abscess without bleeding: Secondary | ICD-10-CM | POA: Diagnosis not present

## 2016-01-18 DIAGNOSIS — K5730 Diverticulosis of large intestine without perforation or abscess without bleeding: Secondary | ICD-10-CM | POA: Diagnosis not present

## 2016-01-18 DIAGNOSIS — M6289 Other specified disorders of muscle: Secondary | ICD-10-CM | POA: Diagnosis not present

## 2016-01-18 DIAGNOSIS — R1030 Lower abdominal pain, unspecified: Secondary | ICD-10-CM | POA: Diagnosis not present

## 2016-01-18 DIAGNOSIS — K5720 Diverticulitis of large intestine with perforation and abscess without bleeding: Secondary | ICD-10-CM | POA: Insufficient documentation

## 2016-01-18 LAB — POCT I-STAT CREATININE: Creatinine, Ser: 1.3 mg/dL — ABNORMAL HIGH (ref 0.61–1.24)

## 2016-01-18 IMAGING — CT CT ABD-PELV W/ CM
2 of 5 series · 15 of 46 positions shown, 17 images · IV contrast (APPLIED)
Comparison: CT dated [DATE]

CLINICAL DATA: 75-year-old male with lower abdominal pain. History
of prostate cancer as well as diverticulitis.

EXAM:
CT ABDOMEN AND PELVIS WITH CONTRAST
TECHNIQUE: Multidetector CT imaging of the abdomen and pelvis was performed
using the standard protocol following bolus administration of
intravenous contrast.
CONTRAST:  100mL [7J] IOPAMIDOL ([7J]) INJECTION 61%

[Series 2: axial st · axial · 0.87mm/px · z∈[-1162,-737]mm · 12 of 97 slices shown, 14 images]
[im 6/97  soft-tissue]
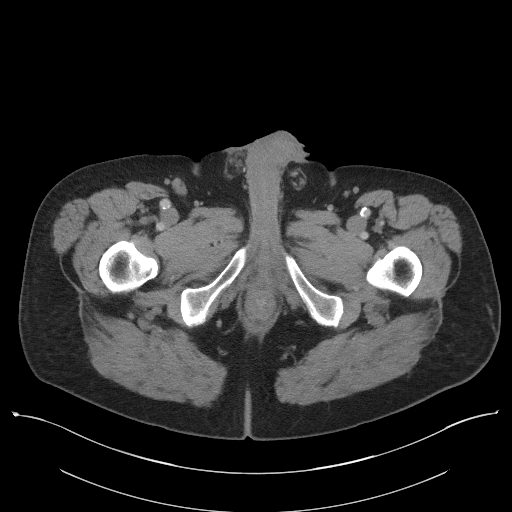
[im 6/97  bone]
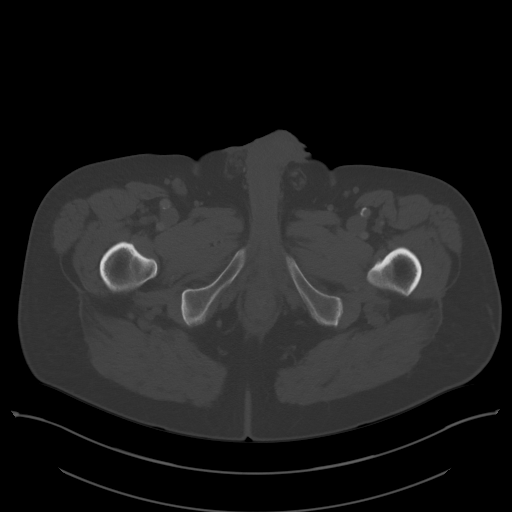
[im 17/97  soft-tissue]
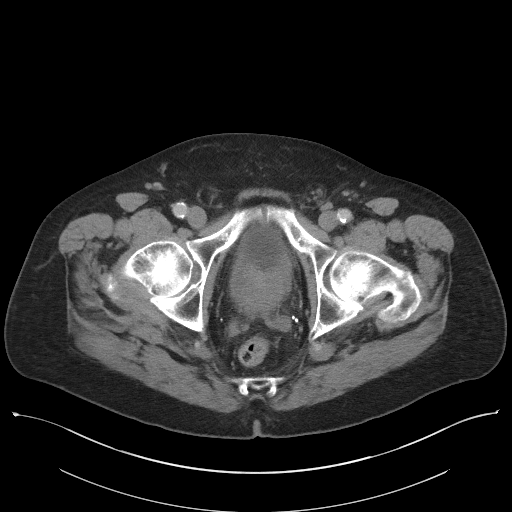
[im 22/97  soft-tissue]
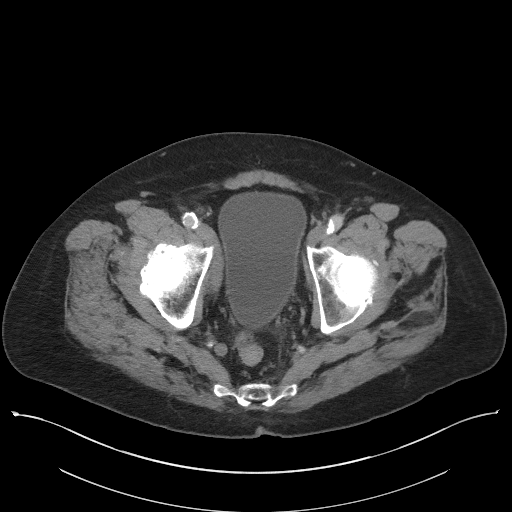
[im 27/97  soft-tissue]
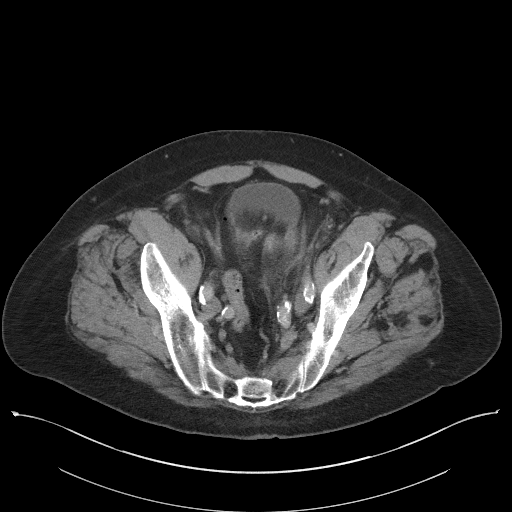
[im 38/97  soft-tissue]
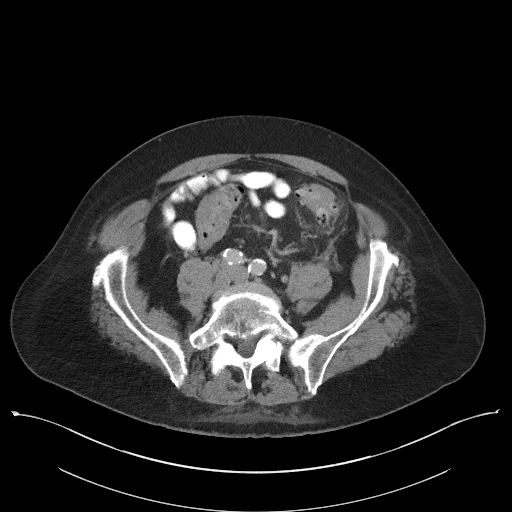
[im 43/97  soft-tissue]
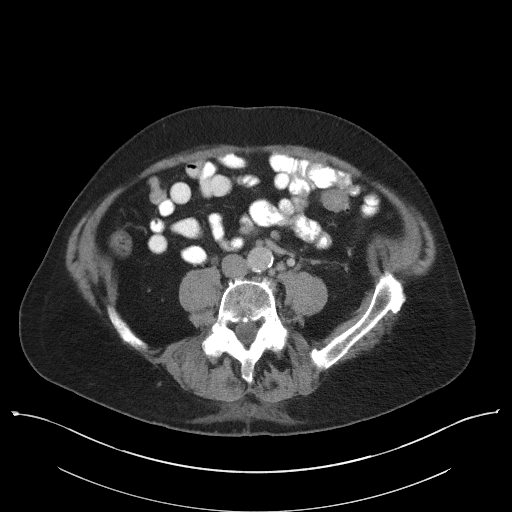
[im 54/97  soft-tissue]
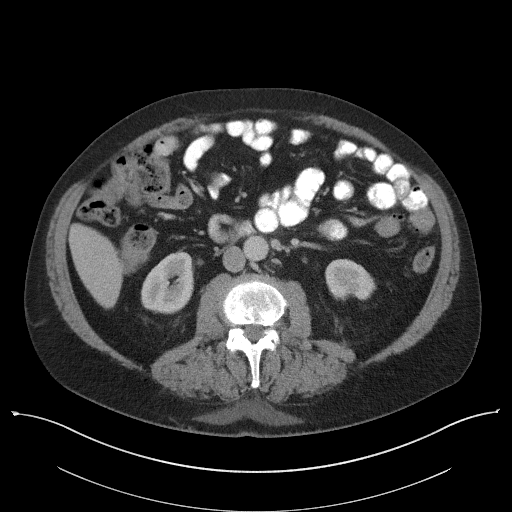
[im 59/97  soft-tissue]
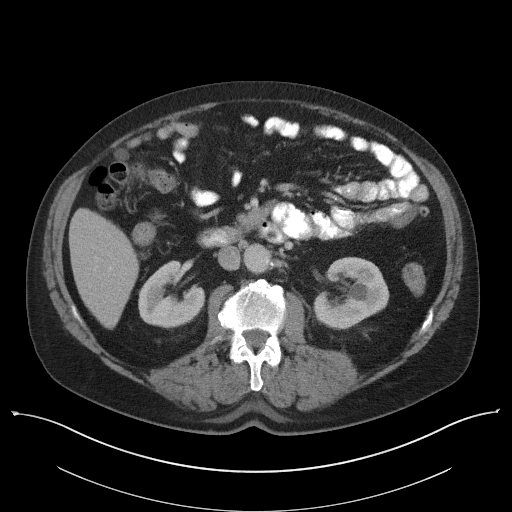
[im 70/97  soft-tissue]
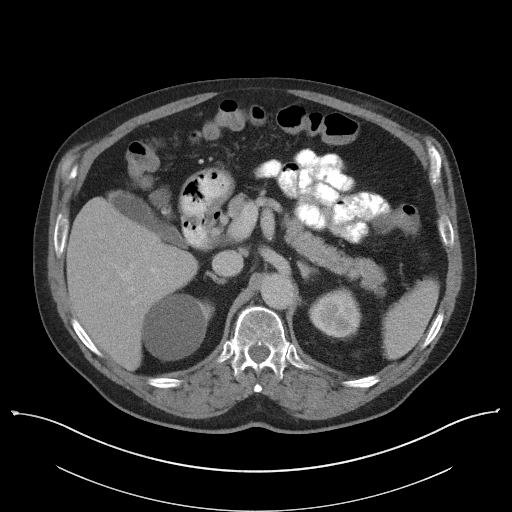
[im 70/97  bone]
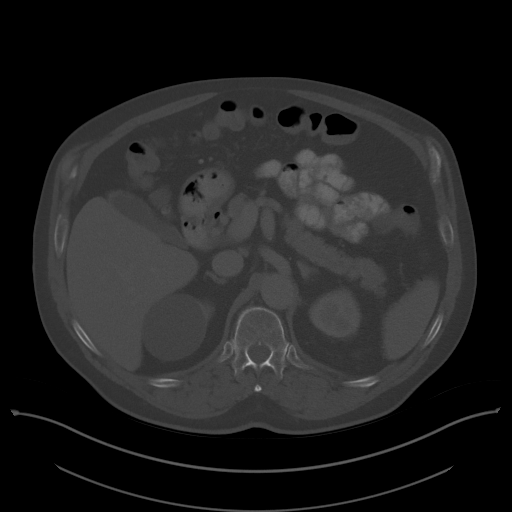
[im 75/97  soft-tissue]
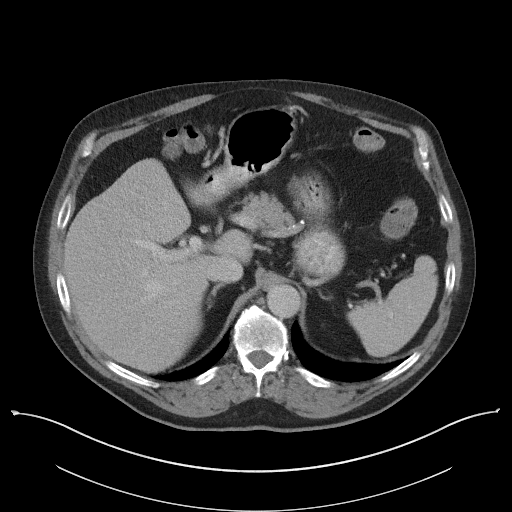
[im 81/97  soft-tissue]
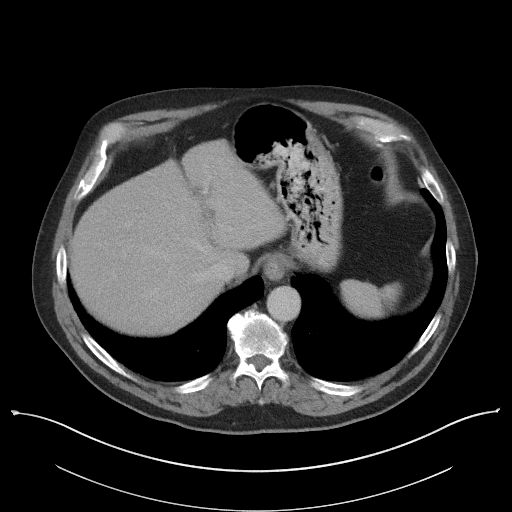
[im 91/97  soft-tissue]
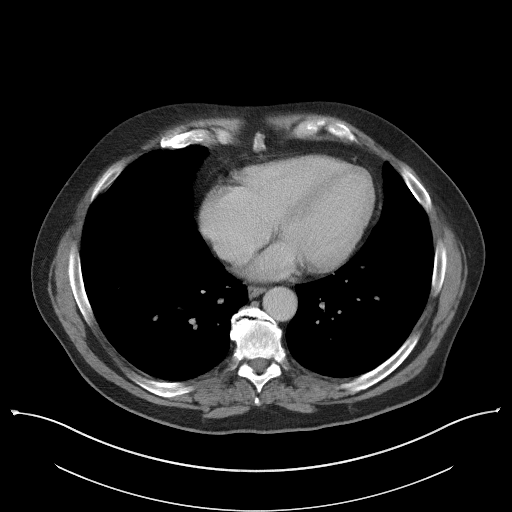

[Series 5: coronal st · coronal · 0.77mm/px · 3 of 94 slices shown]
[im 32/94  soft-tissue]
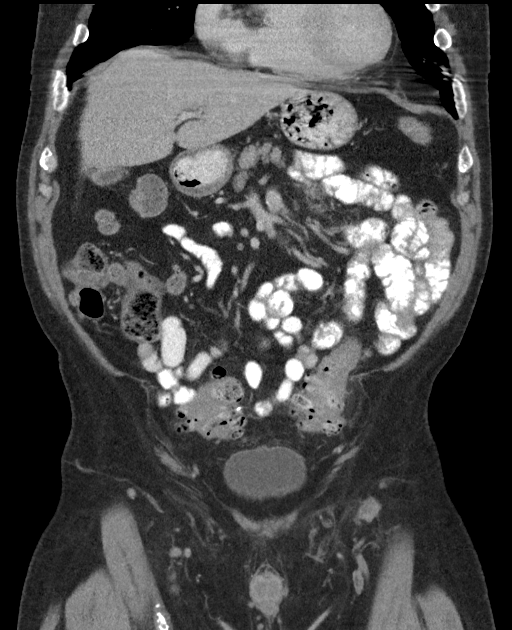
[im 42/94  soft-tissue]
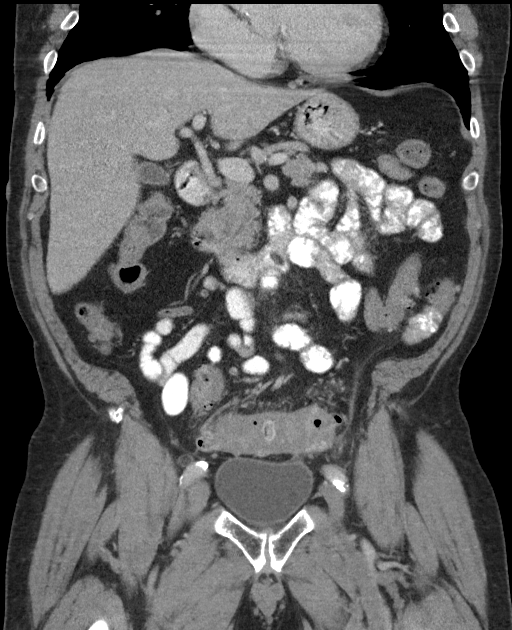
[im 52/94  soft-tissue]
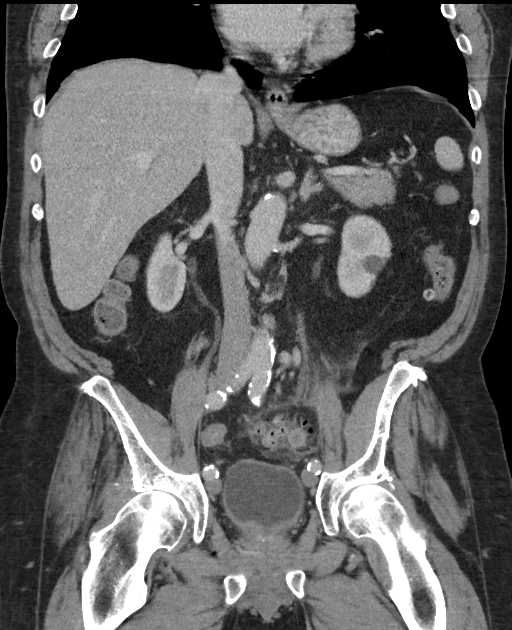

[15 of 46 positions shown; findings below may reference images not displayed]

FINDINGS: Lower chest: The visualized lung bases are clear.

No intra-abdominal free air or free fluid.

Hepatobiliary: The liver is unremarkable. No intrahepatic biliary
ductal dilatation. There is thickened appearance of the gallbladder
fundus likely adenomyomatosis. No calcified gallstone.

Pancreas: Unremarkable. No pancreatic ductal dilatation or
surrounding inflammatory changes.

Spleen: Normal in size without focal abnormality.

Adrenals/Urinary Tract: The adrenal glands appear unremarkable.
Multiple bilateral renal hypodense lesions as seen on the prior
study measuring up to 6.2 cm in the upper pole of the right kidney
most compatible with cysts. There is no hydronephrosis or
nephrolithiasis on either side. The visualized ureters and urinary
bladder appear unremarkable.

Stomach/Bowel: There is extensive sigmoid diverticulosis with
muscular hypertrophy. There is active inflammatory changes of the
sigmoid colon compatible with acute diverticulitis. No drainable
fluid collection/abscess or evidence of diverticular perforation.
Scattered colonic diverticula without active inflammatory changes
noted. Multiple perisigmoid lymph nodes as seen on the prior study,
likely reactive. There is no evidence of bowel obstruction. Normal
appendix.

Vascular/Lymphatic: There is aortoiliac atherosclerotic disease.
There is no aneurysmal dilatation or dissection of the abdominal
aorta. The origins of the celiac axis, SMA, IMA as well as the
origins of the renal arteries are patent. No portal venous gas
identified. There is no adenopathy.

Reproductive: There is slight haziness of the prostate gland.
Multiple biopsy clips noted within the prostate.

Other: Small fat containing umbilical hernia.

Musculoskeletal: Degenerative changes of the spine. No acute
fracture. No suspicious lesions.
IMPRESSION: Sigmoid diverticulitis. No diverticular abscess or perforation.
There is underlying chronic severe diverticular disease of the
sigmoid colon with muscular hypertrophy. Underlying mass is less
likely but not entirely excluded.

Top-normal perisigmoid lymph nodes, likely reactive.

No bowel obstruction.  Normal appendix.

## 2016-01-18 MED ORDER — CIPROFLOXACIN HCL 500 MG PO TABS
500.0000 mg | ORAL_TABLET | Freq: Two times a day (BID) | ORAL | 0 refills | Status: DC
Start: 2016-01-18 — End: 2016-05-16

## 2016-01-18 MED ORDER — METRONIDAZOLE 500 MG PO TABS: 500.0000 mg | ORAL_TABLET | Freq: Four times a day (QID) | ORAL | 0 refills | Status: DC

## 2016-01-18 MED ORDER — IOPAMIDOL (ISOVUE-300) INJECTION 61%
100.0000 mL | Freq: Once | INTRAVENOUS | Status: AC | PRN
  Administered 2016-01-18: 100 mL via INTRAVENOUS

## 2016-01-18 NOTE — Telephone Encounter (Signed)
Pt is having pain in stomach and is not sure if he should go to the hospital.

## 2016-01-18 NOTE — Patient Instructions (Signed)
Nice to see you. Please go to the medical Mall at the hospital to have your scan. We will call you with the results.  If you develop fevers or worsening pain emergency room.

## 2016-01-18 NOTE — Telephone Encounter (Signed)
CT result reviewed. There is sigmoid diverticulitis with no perforation or abscess. I called and spoke with the patient regarding this. Advised of the findings. Discussed that I will send in antibiotics for him to take for this. Advised on clear liquid diet while being treated. I will have my CMA contact him to set up follow-up for Wednesday or Thursday. Given return precautions.

## 2016-01-18 NOTE — Telephone Encounter (Signed)
I spoke to [place] about this. She told me to schedule pt at 3pm today. Called pt and he will come at 3 pm today.

## 2016-01-18 NOTE — Progress Notes (Signed)
  [name], MD Phone: [phone]  Cameron Foster. is a 75 y.o. male who presents today for same-day visit.  Patient notes 5-6 days of lower abdominal discomfort. Worsened to the point yesterday where he was sweating in church. He did go to the walk-in clinic and they advised him to go to the emergency room to be evaluated. He thought he would wait until today to be seen. Does not hurt all the time. Worsens when he tries to strain to have a bowel movement. Had not had a bowel movement until today. He had a small normal bowel movement. No fevers or blood in his stool. No urinary complaints. Some mild nausea with this. No vomiting. He feels bloated. The pain has eased up somewhat today. He does note per his typical abdominal issues that he gets congested with this.  PMH: Former smoker   ROS see history of present illness  Objective  Physical Exam Vitals:   01/18/16 1457  BP: 118/76  Pulse: 70  Temp: 98.5 F (36.9 C)    BP Readings from Last 3 Encounters:  01/18/16 118/76  01/01/16 118/64  10/28/15 130/88   Wt Readings from Last 3 Encounters:  01/01/16 210 lb 3.2 oz (95.3 kg)  10/28/15 203 lb 12.8 oz (92.4 kg)  10/01/15 204 lb 3.2 oz (92.6 kg)    Physical Exam  Constitutional: No distress.  Cardiovascular: Normal rate, regular rhythm and normal heart sounds.   Pulmonary/Chest: Effort normal and breath sounds normal.  Abdominal: Soft. Bowel sounds are normal. He exhibits no distension. There is tenderness (lower mid abdominal discomfort on palpation). There is guarding (Intermittent guarding over the area of tenderness). There is no rebound.  Neurological: He is alert. Gait normal.  Skin: Skin is warm. He is not diaphoretic.     Assessment/Plan: Please see individual problem list.  Lower abdominal pain Patient with recent onset abdominal pain in his lower abdomen. He does have a history of diverticulosis and this could be a cause. Could also be related to  constipation and IBS. No urinary complaints. Does have some intermittent guarding in the area of tenderness in his lower abdomen that is concerning. Given this we'll obtain a CT scan of his abdomen and pelvis to evaluate this issue further. Once his CT scan returns today we will determine the next step in management. He is given return precautions.   Orders Placed This Encounter  Procedures  . CT Abdomen Pelvis W Contrast    Standing Status:   Future    Number of Occurrences:   1    Standing Expiration Date:   04/19/2017    Order Specific Question:   If indicated for the ordered procedure, I authorize the administration of contrast media per Radiology protocol    Answer:   Yes    Order Specific Question:   Reason for Exam (SYMPTOM  OR DIAGNOSIS REQUIRED)    Answer:   lower abdominal pain for 5 days, no bowel movement until today, intermittent guarding on exam    Order Specific Question:   Preferred imaging location?    Answer:   [facility]    Order Specific Question:   Call Results- Best Contact Number?    Answer:   [phone]  Hold patient    [name], MD [place]

## 2016-01-18 NOTE — Telephone Encounter (Signed)
I called and spoke with the radiology department at [facility] after I noted that the patients CT scan had not been placed as a stat order. I asked if they could change the order to stat and they noted they would change the priority to stat and call the radiologist to inform them of this so that they were aware of this. I will await the read and contact the patient when this results.

## 2016-01-18 NOTE — Telephone Encounter (Signed)
Spoke with Cameron Foster and he feels like he is stopped up. Patient went to urgent care yesterday and they advised him to go to the ER. I have scheduled patient to come in to be seen here at [place] patient that if he starts vomiting or feeling nausea he should go to ED. Patient verbalized understanding.

## 2016-01-18 NOTE — Assessment & Plan Note (Signed)
Patient with recent onset abdominal pain in his lower abdomen. He does have a history of diverticulosis and this could be a cause. Could also be related to constipation and IBS. No urinary complaints. Does have some intermittent guarding in the area of tenderness in his lower abdomen that is concerning. Given this we'll obtain a CT scan of his abdomen and pelvis to evaluate this issue further. Once his CT scan returns today we will determine the next step in management. He is given return precautions.

## 2016-01-19 NOTE — Telephone Encounter (Signed)
Spoke with patient and scheduled for 01/20/16 @ 11:15

## 2016-01-20 ENCOUNTER — Ambulatory Visit (INDEPENDENT_AMBULATORY_CARE_PROVIDER_SITE_OTHER): Payer: Medicare Other | Admitting: Family Medicine

## 2016-01-20 ENCOUNTER — Encounter: Payer: Self-pay | Admitting: Family Medicine

## 2016-01-20 VITALS — BP 122/88 | HR 72 | Temp 98.4°F | Wt 210.5 lb

## 2016-01-20 DIAGNOSIS — K5732 Diverticulitis of large intestine without perforation or abscess without bleeding: Secondary | ICD-10-CM | POA: Diagnosis not present

## 2016-01-20 NOTE — Progress Notes (Signed)
Pre visit review using our clinic review tool, if applicable. No additional management support is needed unless otherwise documented below in the visit note. 

## 2016-01-20 NOTE — Patient Instructions (Signed)
Nice to see you. I am glad you're improving. You need to continue the antibiotics. You need to keep your diet [place]. If you develop pain with eating you need to go back to a clear liquid diet. If you develop increased abdominal pain, fevers, or any new or changing symptoms please seek medical attention immediately.

## 2016-01-20 NOTE — Assessment & Plan Note (Signed)
Patient is doing quite a bit better since starting on antibiotics. I advised him not to use stool softeners or laxatives at this time. Encouraged clear liquid diet and bland diet. He'll continue antibiotics. We'll refer him to a different general surgeon to discuss partial colectomy given recurrent diverticulitis. He'll follow-up with me on Monday for recheck. He is given return precautions.

## 2016-01-20 NOTE — Progress Notes (Signed)
  Cameron Rumps, MD Phone: [phone]  Cameron Foster. is a 75 y.o. male who presents today for follow-up.  Patient seen 2 days ago for lower abdominal discomfort and found to have diverticulitis on CT scan. Notes the pain is quite a bit better. Some minimal discomfort now. No nausea or vomiting. He noted he started taking senna and MiraLAX to help him have a bowel movement. I advised him against this today. He's been drinking some clear liquids and also eating solid foods such as macaroni salad and collards. No fevers. Having bowel movements. He is taking any antibiotics. Is interested in seeing a different surgeon to discuss possible partial colectomy given his recurrent diverticulitis.  ROS see history of present illness  Objective  Physical Exam Vitals:   01/20/16 1119  BP: 122/88  Pulse: 72  Temp: 98.4 F (36.9 C)    BP Readings from Last 3 Encounters:  01/20/16 122/88  01/18/16 118/76  01/01/16 118/64   Wt Readings from Last 3 Encounters:  01/20/16 210 lb 8 oz (95.5 kg)  01/01/16 210 lb 3.2 oz (95.3 kg)  10/28/15 203 lb 12.8 oz (92.4 kg)    Physical Exam  Constitutional: He is well-developed, well-nourished, and in no distress.  Cardiovascular: Normal rate, regular rhythm and normal heart sounds.   Pulmonary/Chest: Effort normal and breath sounds normal.  Abdominal: Soft. Bowel sounds are normal. He exhibits no distension. There is no tenderness. There is no rebound and no guarding.  Neurological: He is alert. Gait normal.  Skin: Skin is warm and dry.     Assessment/Plan: Please see individual problem list.  Diverticulitis large intestine Patient is doing quite a bit better since starting on antibiotics. I advised him not to use stool softeners or laxatives at this time. Encouraged clear liquid diet and bland diet. He'll continue antibiotics. We'll refer him to a different general surgeon to discuss partial colectomy given recurrent diverticulitis. He'll  follow-up with me on Monday for recheck. He is given return precautions.   Orders Placed This Encounter  Procedures  . Ambulatory referral to General Surgery    Referral Priority:   Routine    Referral Type:   Surgical    Referral Reason:   Specialty Services Required    Requested Specialty:   General Surgery    Number of Visits Requested:   1    Cameron Rumps, MD [place]

## 2016-01-25 ENCOUNTER — Ambulatory Visit: Payer: PRIVATE HEALTH INSURANCE | Admitting: Family Medicine

## 2016-01-27 ENCOUNTER — Ambulatory Visit (INDEPENDENT_AMBULATORY_CARE_PROVIDER_SITE_OTHER): Payer: Medicare Other | Admitting: Family Medicine

## 2016-01-27 ENCOUNTER — Encounter: Payer: Self-pay | Admitting: Family Medicine

## 2016-01-27 DIAGNOSIS — R55 Syncope and collapse: Secondary | ICD-10-CM | POA: Diagnosis not present

## 2016-01-27 DIAGNOSIS — K5732 Diverticulitis of large intestine without perforation or abscess without bleeding: Secondary | ICD-10-CM

## 2016-01-27 NOTE — Assessment & Plan Note (Signed)
Patient with a history of this. No recent episodes. Recent cardiac workup reassuring. He will monitor for recurrence. Given return precautions.

## 2016-01-27 NOTE — Assessment & Plan Note (Signed)
Improved. Advised to finish antibiotics. He will see the surgeon next week. Given return precautions.

## 2016-01-27 NOTE — Progress Notes (Signed)
Pre visit review using our clinic review tool, if applicable. No additional management support is needed unless otherwise documented below in the visit note. 

## 2016-01-27 NOTE — Patient Instructions (Addendum)
Nice to see you. Glad your doing better. Please keep the appointment with the surgeon next week. Please complete your antibiotics. If you ever pass out please seek medical attention.

## 2016-01-27 NOTE — Progress Notes (Signed)
  [name], MD Phone: [phone]  Cameron Foster. is a 75 y.o. male who presents today for follow-up.  Diverticulitis: Patient has been treated for diverticulitis over the last week. He has one more day of antibiotics. No pain or fevers. Is having a bowel movement daily. They're soft. Some mild intermittent queasiness. Notes he is seeing the surgeon next week to discuss his recurrent diverticulitis.  History of vasovagal syncope: Last time he had a syncopal episode was a year and a half ago. He would typically get sweaty and then suddenly feel lightheaded and then drop to the ground. He notes he was evaluated at that time in [place] and was advised that he had vasovagal syncope. He recently had an echo on her stress test that were reassuring. No recurrent issues with syncope recently.  ROS see history of present illness  Objective  Physical Exam Vitals:   01/27/16 0958  BP: 114/76  Pulse: 83  Temp: 98.2 F (36.8 C)    BP Readings from Last 3 Encounters:  01/27/16 114/76  01/20/16 122/88  01/18/16 118/76   Wt Readings from Last 3 Encounters:  01/27/16 210 lb 3.2 oz (95.3 kg)  01/20/16 210 lb 8 oz (95.5 kg)  01/01/16 210 lb 3.2 oz (95.3 kg)    Physical Exam  Constitutional: He is well-developed, well-nourished, and in no distress.  Cardiovascular: Normal rate, regular rhythm and normal heart sounds.   Pulmonary/Chest: Effort normal and breath sounds normal.  Abdominal: Soft. Bowel sounds are normal. He exhibits no distension. There is no tenderness. There is no rebound and no guarding.  Rectus diastasis noted  Neurological: He is alert. Gait normal.     Assessment/Plan: Please see individual problem list.  Diverticulitis large intestine Improved. Advised to finish antibiotics. He will see the surgeon next week. Given return precautions.  Vasovagal syncope Patient with a history of this. No recent episodes. Recent cardiac workup reassuring. He will monitor  for recurrence. Given return precautions.   [name], MD [place]

## 2016-02-02 DIAGNOSIS — K5732 Diverticulitis of large intestine without perforation or abscess without bleeding: Secondary | ICD-10-CM | POA: Diagnosis not present

## 2016-02-02 DIAGNOSIS — M6208 Separation of muscle (nontraumatic), other site: Secondary | ICD-10-CM | POA: Diagnosis not present

## 2016-02-08 ENCOUNTER — Ambulatory Visit (INDEPENDENT_AMBULATORY_CARE_PROVIDER_SITE_OTHER): Payer: Medicare Other

## 2016-02-08 ENCOUNTER — Other Ambulatory Visit: Payer: Self-pay

## 2016-02-08 DIAGNOSIS — Z23 Encounter for immunization: Secondary | ICD-10-CM | POA: Diagnosis not present

## 2016-02-08 MED ORDER — OMEPRAZOLE 40 MG PO CPDR: 40.0000 mg | DELAYED_RELEASE_CAPSULE | Freq: Every day | ORAL | 0 refills | Status: DC

## 2016-02-09 ENCOUNTER — Other Ambulatory Visit: Payer: Self-pay | Admitting: Family Medicine

## 2016-02-09 ENCOUNTER — Telehealth: Payer: Self-pay | Admitting: Family Medicine

## 2016-02-09 ENCOUNTER — Encounter: Payer: Self-pay | Admitting: Family Medicine

## 2016-02-09 DIAGNOSIS — K5732 Diverticulitis of large intestine without perforation or abscess without bleeding: Secondary | ICD-10-CM

## 2016-02-09 NOTE — Telephone Encounter (Signed)
Pt called and asked if Dr. Caryl Bis could but in a referral for a Gastroenterologist in for him. He would like to see Dr. Keith Rake on [facility]. Please advise, thank you!  Call pt @ [phone]

## 2016-02-10 ENCOUNTER — Other Ambulatory Visit: Payer: Self-pay | Admitting: Surgical

## 2016-02-10 ENCOUNTER — Telehealth: Payer: Self-pay | Admitting: *Deleted

## 2016-02-10 MED ORDER — LISINOPRIL 10 MG PO TABS
10.0000 mg | ORAL_TABLET | Freq: Every day | ORAL | 3 refills | Status: DC
Start: 2016-02-10 — End: 2016-12-23

## 2016-02-10 MED ORDER — SERTRALINE HCL 50 MG PO TABS: 50.0000 mg | ORAL_TABLET | Freq: Every day | ORAL | 3 refills | Status: DC

## 2016-02-10 MED ORDER — AMLODIPINE BESYLATE 5 MG PO TABS: 5.0000 mg | ORAL_TABLET | Freq: Every day | ORAL | 3 refills | Status: DC

## 2016-02-10 MED ORDER — OMEPRAZOLE 40 MG PO CPDR: 40.0000 mg | DELAYED_RELEASE_CAPSULE | Freq: Every day | ORAL | 0 refills | Status: DC

## 2016-02-10 MED ORDER — METOPROLOL SUCCINATE ER 25 MG PO TB24: 25.0000 mg | ORAL_TABLET | Freq: Every day | ORAL | 3 refills | Status: DC

## 2016-02-10 NOTE — Telephone Encounter (Signed)
Referral placed.

## 2016-02-10 NOTE — Telephone Encounter (Signed)
Pt requested a call in reference to questions about medications and a mychart message Pt  [phone]

## 2016-02-10 NOTE — Telephone Encounter (Signed)
Left detailed message.   

## 2016-02-11 NOTE — Telephone Encounter (Signed)
Spoke with patient about wife's medication that was given as sample yesterday. They called with dosage of medication. I explained that Dr. Caryl Bis was aware of dosage during patients appointment yesterday.

## 2016-02-16 DIAGNOSIS — G8929 Other chronic pain: Secondary | ICD-10-CM | POA: Diagnosis not present

## 2016-02-16 DIAGNOSIS — Z86010 Personal history of colonic polyps: Secondary | ICD-10-CM | POA: Diagnosis not present

## 2016-02-16 DIAGNOSIS — R109 Unspecified abdominal pain: Secondary | ICD-10-CM | POA: Diagnosis not present

## 2016-02-16 DIAGNOSIS — R1084 Generalized abdominal pain: Secondary | ICD-10-CM | POA: Insufficient documentation

## 2016-02-16 DIAGNOSIS — K5732 Diverticulitis of large intestine without perforation or abscess without bleeding: Secondary | ICD-10-CM | POA: Diagnosis not present

## 2016-02-16 DIAGNOSIS — R1013 Epigastric pain: Secondary | ICD-10-CM | POA: Diagnosis not present

## 2016-02-23 DIAGNOSIS — L308 Other specified dermatitis: Secondary | ICD-10-CM | POA: Diagnosis not present

## 2016-02-23 DIAGNOSIS — L989 Disorder of the skin and subcutaneous tissue, unspecified: Secondary | ICD-10-CM | POA: Diagnosis not present

## 2016-02-23 DIAGNOSIS — Z23 Encounter for immunization: Secondary | ICD-10-CM | POA: Diagnosis not present

## 2016-02-23 DIAGNOSIS — C44622 Squamous cell carcinoma of skin of right upper limb, including shoulder: Secondary | ICD-10-CM | POA: Diagnosis not present

## 2016-02-23 DIAGNOSIS — D485 Neoplasm of uncertain behavior of skin: Secondary | ICD-10-CM | POA: Diagnosis not present

## 2016-02-23 DIAGNOSIS — L570 Actinic keratosis: Secondary | ICD-10-CM | POA: Diagnosis not present

## 2016-02-23 DIAGNOSIS — L821 Other seborrheic keratosis: Secondary | ICD-10-CM | POA: Diagnosis not present

## 2016-02-29 ENCOUNTER — Other Ambulatory Visit: Payer: Self-pay | Admitting: Urology

## 2016-02-29 DIAGNOSIS — R339 Retention of urine, unspecified: Secondary | ICD-10-CM

## 2016-03-02 IMAGING — US US EXTREM LOW VENOUS*R*
1 series · 13 of 24 positions shown · non-contrast
Comparison: None.

CLINICAL DATA: Lower extremity edema.



[Series 1: us extrem low venous*right* · 0.08mm/px · 42 acquisitions, 13 frames shown]
[im 1/42]
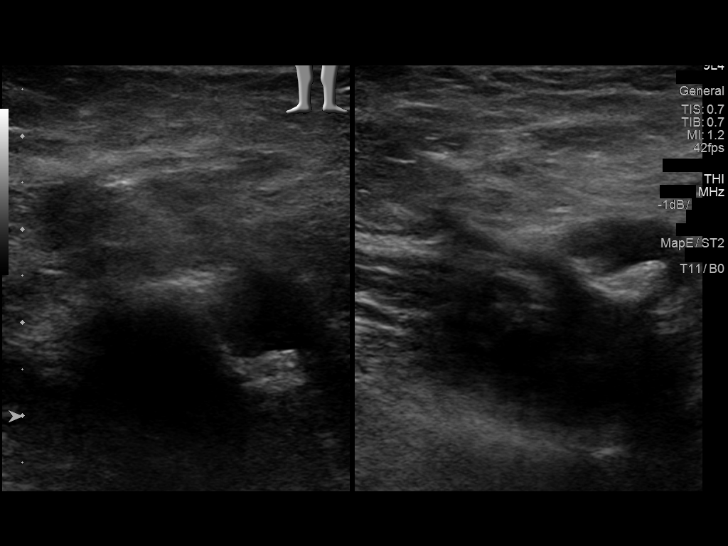
[im 4/42]
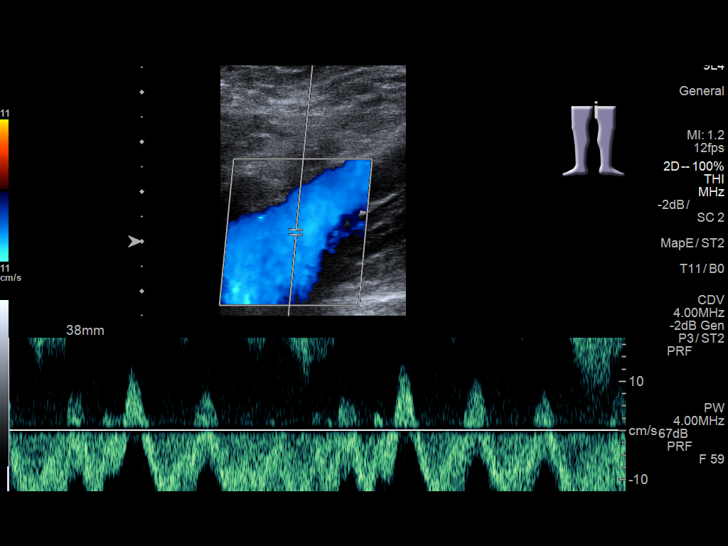
[im 8/42]
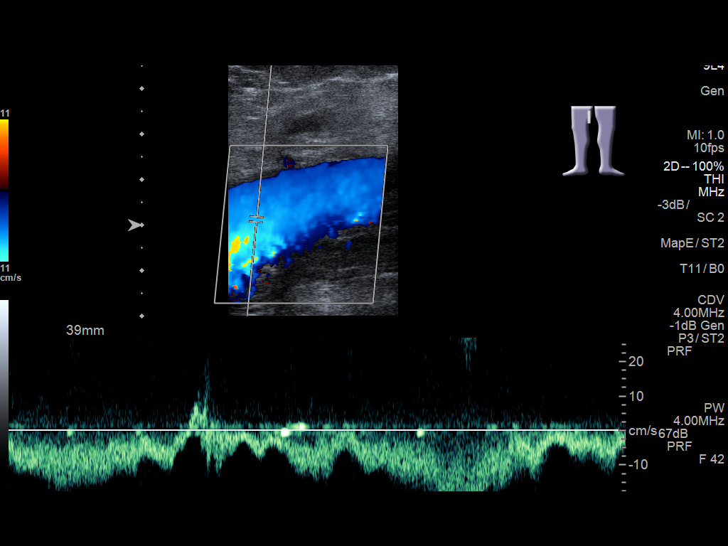
[im 11/42]
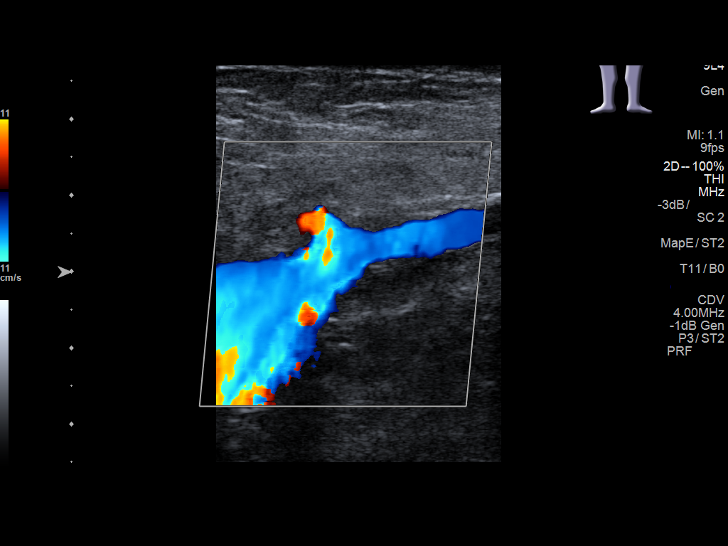
[im 15/42]
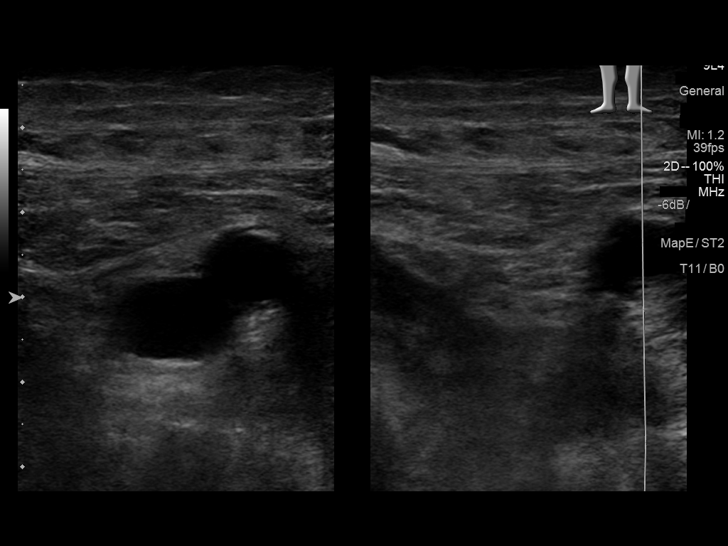
[im 18/42]
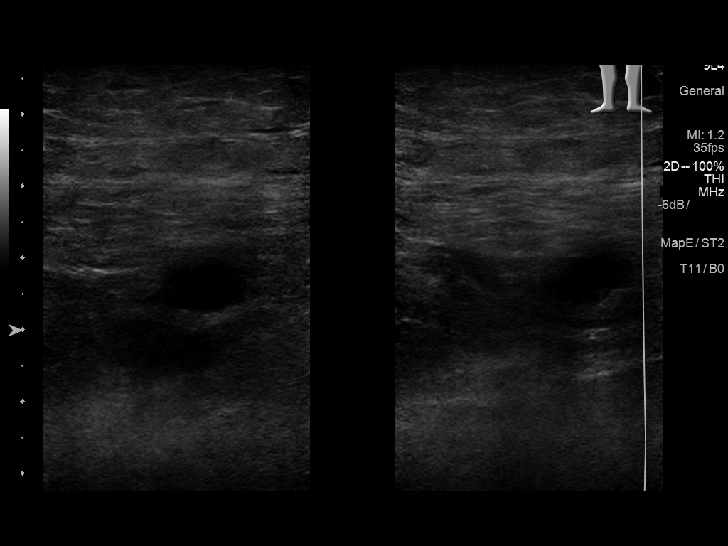
[im 24/42]
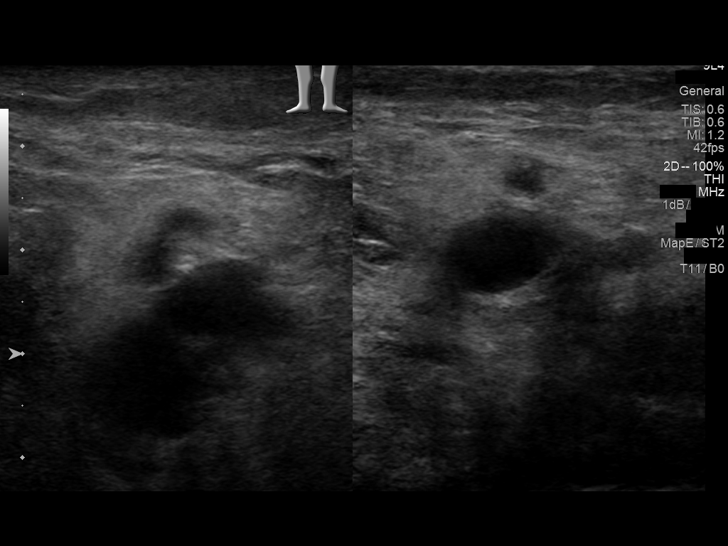
[im 25/42]
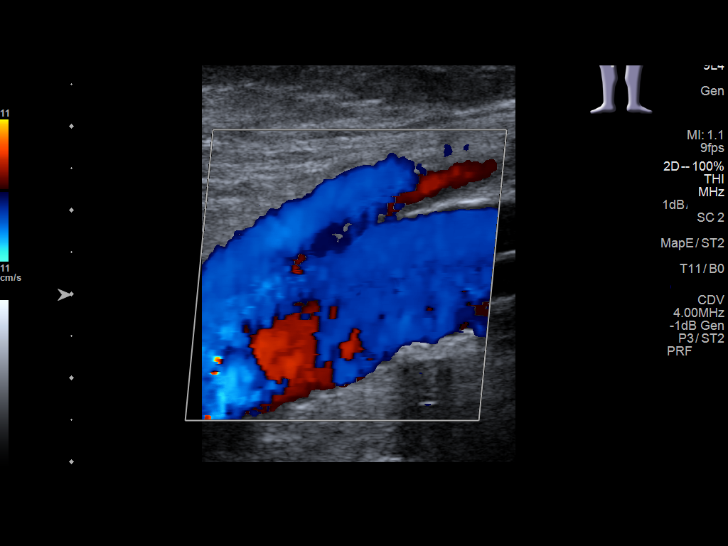
[im 29/42]
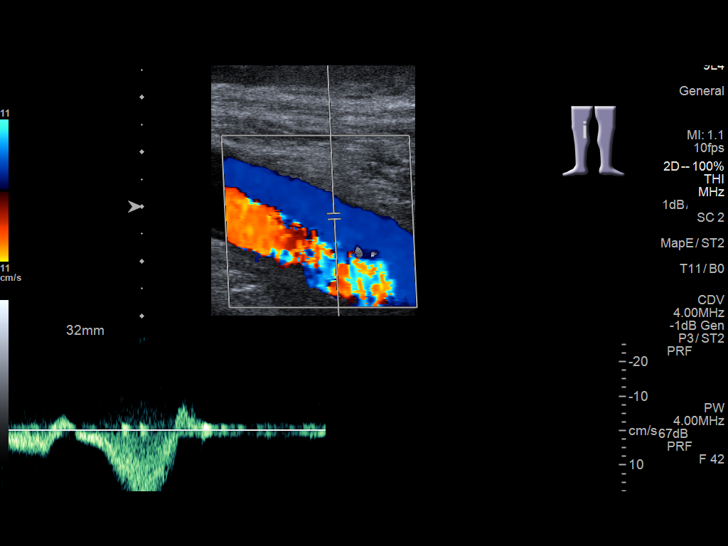
[im 33/42]
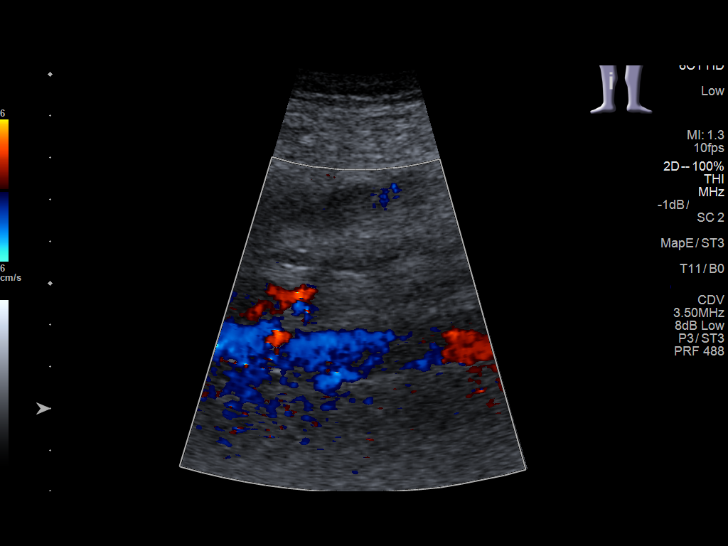
[im 36/42]
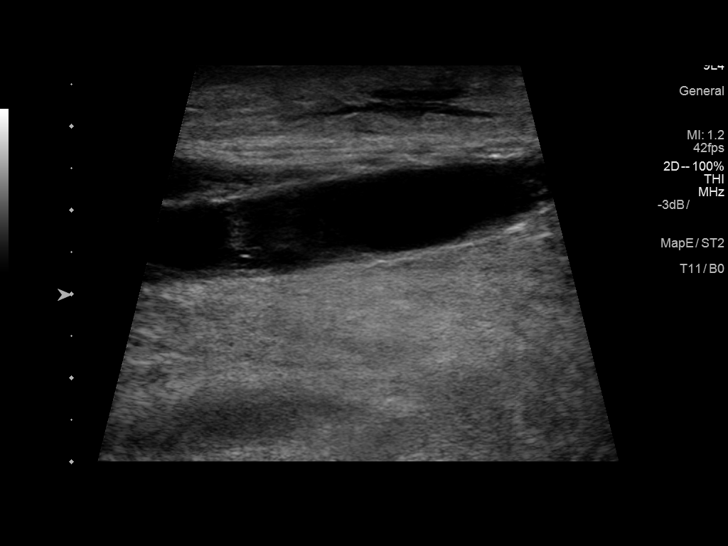
[im 40/42]
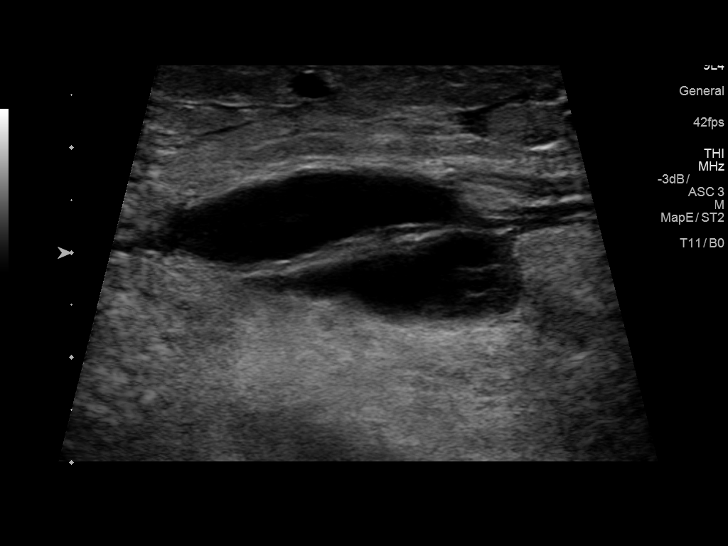
[im 42/42]
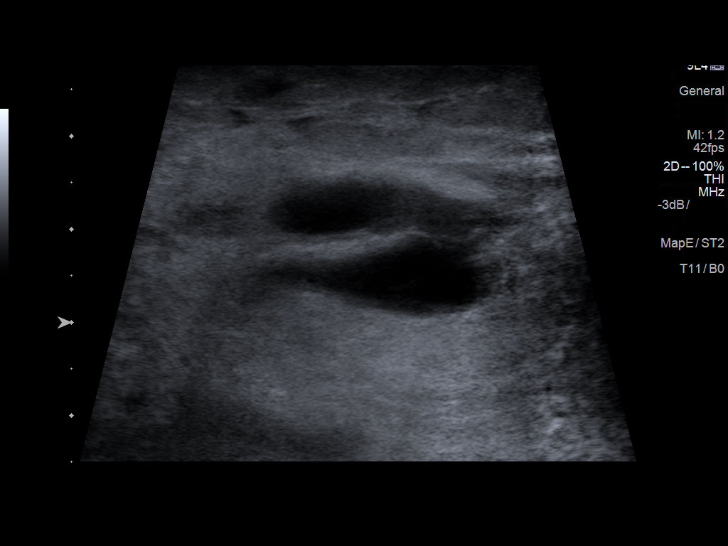

[13 of 24 positions shown; findings below may reference images not displayed]

FINDINGS: Contralateral Common Femoral Vein: Respiratory phasicity is normal
and symmetric with the symptomatic side. No evidence of thrombus.
Normal compressibility.

Common Femoral Vein: No evidence of thrombus. Normal
compressibility, respiratory phasicity and response to augmentation.

Saphenofemoral Junction: No evidence of thrombus. Normal
compressibility and flow on color Doppler imaging.

Profunda Femoral Vein: No evidence of thrombus. Normal
compressibility and flow on color Doppler imaging.

Femoral Vein: No evidence of thrombus. Normal compressibility,
respiratory phasicity and response to augmentation.

Popliteal Vein: No evidence of thrombus. Normal compressibility,
respiratory phasicity and response to augmentation.

Calf Veins: No evidence of thrombus. Normal compressibility and flow
on color Doppler imaging.

Superficial Great Saphenous Vein: No evidence of thrombus. Normal
compressibility and flow on color Doppler imaging.

Venous Reflux:  None.

Other Findings: Fluid collection on the medial side of the right
calf measuring 11.2 x 1.4 x 3.4 cm likely reflecting a a liquefying
hematoma.
IMPRESSION: 1. No evidence of DVT within the right lower extremity.
2. Fluid collection on the medial side of the right calf measuring
11.2 x 1.4 x 3.4 cm likely reflecting a a liquefying hematoma.

## 2016-03-04 ENCOUNTER — Encounter: Payer: Self-pay | Admitting: Family Medicine

## 2016-03-04 ENCOUNTER — Telehealth: Payer: Self-pay | Admitting: Family Medicine

## 2016-03-04 NOTE — Telephone Encounter (Signed)
Pt called and wanted to speak with you. Thank you!  Call pt @ [phone]

## 2016-03-04 NOTE — Telephone Encounter (Signed)
Spoke with the patient, he gave dates around 2022/09/02 to investigate on what codes were used.  He was appreciative of the call and I told him I will look further in to the charges and get back to him.    Investigated in the chart and patient was seen on 10/28/15 for a rash bsed from possible bug bites or chiggers.  Patient was given a Depo-Medrol injection in the office as the itching was so bad on his legs, waistline and torso.  Also given a Topical cream for use at home.  Visit was coded R21 for the rash.    Spoke with [place], discussed how OV was coded and she will call billing at [facility] to see if we can have his appt up for review to see if the coding can be changed.

## 2016-03-04 NOTE — Telephone Encounter (Signed)
Did more investigating and charges that have been not paid by his insurance are the Lab draws from 07/30/15.  Patient had labs drawn for B12 and A1C and PCP ordered to be coded under myalgia and obesity which the insurance will not cover.   So issue is that is what was coded based on his visit, charges can't be changed per [facility] office team lead. I will call the patient to let him know. thanks

## 2016-03-09 DIAGNOSIS — M170 Bilateral primary osteoarthritis of knee: Secondary | ICD-10-CM | POA: Diagnosis not present

## 2016-03-09 NOTE — Telephone Encounter (Signed)
Pt requested to have a call in reference to this insurance matter, he questioned if his insurance has been refilled  Pt contact  [phone]

## 2016-03-09 NOTE — Telephone Encounter (Signed)
I spoke with the patient.  He is wanting to see if the codes can change, see the most recent message below regarding his lab work. Please advise? Thanks

## 2016-03-14 DIAGNOSIS — D4709 Other mast cell neoplasms of uncertain behavior: Secondary | ICD-10-CM | POA: Diagnosis not present

## 2016-03-14 DIAGNOSIS — C44629 Squamous cell carcinoma of skin of left upper limb, including shoulder: Secondary | ICD-10-CM | POA: Diagnosis not present

## 2016-03-14 NOTE — Telephone Encounter (Signed)
Can you assist with this, thanks

## 2016-03-14 NOTE — Telephone Encounter (Signed)
You could try changing the diagnosis to neuropathy as this is another diagnosis I used in that visit and in my mind the tests that were ordered were also to evaluate for potential neuropathy causes. If that does not work we will be unable to change the codes.

## 2016-04-12 ENCOUNTER — Other Ambulatory Visit: Payer: Self-pay | Admitting: Family Medicine

## 2016-04-12 NOTE — Telephone Encounter (Signed)
Please advise on refill.

## 2016-04-13 NOTE — Telephone Encounter (Signed)
Sent to pharmacy 

## 2016-05-02 DIAGNOSIS — L570 Actinic keratosis: Secondary | ICD-10-CM | POA: Diagnosis not present

## 2016-05-02 DIAGNOSIS — C44622 Squamous cell carcinoma of skin of right upper limb, including shoulder: Secondary | ICD-10-CM | POA: Diagnosis not present

## 2016-05-02 DIAGNOSIS — M170 Bilateral primary osteoarthritis of knee: Secondary | ICD-10-CM | POA: Diagnosis not present

## 2016-05-09 ENCOUNTER — Encounter: Payer: Self-pay | Admitting: Family Medicine

## 2016-05-10 DIAGNOSIS — D4701 Cutaneous mastocytosis: Secondary | ICD-10-CM | POA: Diagnosis not present

## 2016-05-10 DIAGNOSIS — L570 Actinic keratosis: Secondary | ICD-10-CM | POA: Diagnosis not present

## 2016-05-11 ENCOUNTER — Telehealth: Payer: Self-pay | Admitting: *Deleted

## 2016-05-11 NOTE — Telephone Encounter (Signed)
[facility] requested to know if the form for medical clarence was received Contact [phone]

## 2016-05-11 NOTE — Telephone Encounter (Signed)
Patient needs an appointment for clearance.

## 2016-05-11 NOTE — Telephone Encounter (Signed)
Pt stated that he will need a medication clearance for his knee surgery.  Pt contact [phone] or [phone]

## 2016-05-11 NOTE — Telephone Encounter (Signed)
Informed her that we have not received this

## 2016-05-11 NOTE — Telephone Encounter (Signed)
Scheduled for appmt 05/16/16

## 2016-05-11 NOTE — Telephone Encounter (Signed)
Medication clearance?

## 2016-05-16 ENCOUNTER — Encounter: Payer: Self-pay | Admitting: Family Medicine

## 2016-05-16 ENCOUNTER — Ambulatory Visit (INDEPENDENT_AMBULATORY_CARE_PROVIDER_SITE_OTHER): Payer: Medicare Other | Admitting: Family Medicine

## 2016-05-16 ENCOUNTER — Telehealth: Payer: Self-pay | Admitting: Radiology

## 2016-05-16 VITALS — BP 132/84 | HR 79 | Temp 98.7°F | Wt 214.4 lb

## 2016-05-16 DIAGNOSIS — R7309 Other abnormal glucose: Secondary | ICD-10-CM | POA: Diagnosis not present

## 2016-05-16 DIAGNOSIS — R8290 Unspecified abnormal findings in urine: Secondary | ICD-10-CM

## 2016-05-16 DIAGNOSIS — Z01818 Encounter for other preprocedural examination: Secondary | ICD-10-CM | POA: Insufficient documentation

## 2016-05-16 DIAGNOSIS — Z5189 Encounter for other specified aftercare: Secondary | ICD-10-CM

## 2016-05-16 LAB — COMPREHENSIVE METABOLIC PANEL
ALBUMIN: 3.9 g/dL (ref 3.5–5.2)
ALT: 11 U/L (ref 0–53)
AST: 19 U/L (ref 0–37)
Alkaline Phosphatase: 62 U/L (ref 39–117)
BUN: 13 mg/dL (ref 6–23)
CALCIUM: 9.3 mg/dL (ref 8.4–10.5)
CHLORIDE: 105 meq/L (ref 96–112)
CO2: 27 mEq/L (ref 19–32)
Creatinine, Ser: 1.27 mg/dL (ref 0.40–1.50)
GFR: 58.65 mL/min — AB (ref >60.00)
Glucose, Bld: 102 mg/dL — ABNORMAL HIGH (ref 70–99)
POTASSIUM: 4.1 meq/L (ref 3.5–5.1)
Sodium: 137 mEq/L (ref 135–145)
Total Bilirubin: 0.4 mg/dL (ref 0.2–1.2)
Total Protein: 6.9 g/dL (ref 6.0–8.3)

## 2016-05-16 LAB — POCT URINALYSIS DIPSTICK
Glucose, UA: NEGATIVE
LEUKOCYTES UA: NEGATIVE
NITRITE UA: NEGATIVE
PH UA: 5
PROTEIN UA: 30
RBC UA: NEGATIVE
Spec Grav, UA: >=1.03
Urobilinogen, UA: 0.2

## 2016-05-16 LAB — CBC
HEMATOCRIT: 37.5 % — AB (ref 39.0–52.0)
HEMOGLOBIN: 12.6 g/dL — AB (ref 13.0–17.0)
MCHC: 33.6 g/dL (ref 30.0–36.0)
MCV: 87.4 fl (ref 78.0–100.0)
PLATELETS: 297 10*3/uL (ref 150.0–400.0)
RBC: 4.29 Mil/uL (ref 4.22–5.81)
RDW: 13.9 % (ref 11.5–15.5)
WBC: 6.8 10*3/uL (ref 4.0–10.5)

## 2016-05-16 LAB — HEMOGLOBIN A1C: Hgb A1c MFr Bld: 5.6 % (ref 4.6–6.5)

## 2016-05-16 NOTE — Progress Notes (Signed)
Pre visit review using our clinic review tool, if applicable. No additional management support is needed unless otherwise documented below in the visit note. 

## 2016-05-16 NOTE — Patient Instructions (Signed)
Nice to see you. We will get some lab work today and contact her with results. Once this returns we'll fill out sure surgical clearance form.

## 2016-05-16 NOTE — Addendum Note (Signed)
Addended by: Arby Barrette on: 05/16/2016 04:44 PM   Modules accepted: Orders

## 2016-05-16 NOTE — Assessment & Plan Note (Signed)
Wound from recent skin procedure to dermatology appears to be well-healing. There are no signs of sutures. Bandages were removed. Discussed monitoring for signs of infection.

## 2016-05-16 NOTE — Addendum Note (Signed)
Addended by: Leone Haven on: 05/16/2016 04:29 PM   Modules accepted: Orders

## 2016-05-16 NOTE — Telephone Encounter (Signed)
Pt UA was abnormal, would you like urine culture?

## 2016-05-16 NOTE — Telephone Encounter (Signed)
I have order a culture and micro. Thanks.

## 2016-05-16 NOTE — Progress Notes (Signed)
[name], MD Phone: [phone]  Cameron Foster. is a 76 y.o. male who presents today for follow-up.  Patient presents for surgical clearance. He is going to have a right total knee replacement. He denies chest pain and breathlessness with physical activity. No kidney disease. No family history of anesthesia issues. He does note he had a personal history of anesthesia issues which he thinks is related to ether when he was 76 years old. He notes no history of heart attack or stroke. He does note having irregular heartbeat in the 1990s that was related to electrolyte abnormality though has not had any symptoms since then. He notes no history of seizures or epilepsy. No pain or stiffness or arthritis in his neck or jaw. No thyroid disease, angina, liver disease, heart failure, asthma, bronchitis, or history of diabetes. He does have a history of vasovagal syncope. He was evaluated for dyspnea earlier this year with stress test and echo both of which were reassuring. He notes no dyspnea recently.  Patient additionally notes he had a skin procedure on his right hand about 2 weeks ago in the dermatology office. He still has some bandage on it that he has been having trouble getting off. He notes they placed sutures that he was advised would dissolve on their own. He notes no signs of infection.  PMH: Former smoker   ROS see history of present illness  Objective  Physical Exam Vitals:   05/16/16 1114  BP: 132/84  Pulse: 79  Temp: 98.7 F (37.1 C)    BP Readings from Last 3 Encounters:  05/16/16 132/84  01/27/16 114/76  01/20/16 122/88   Wt Readings from Last 3 Encounters:  05/16/16 214 lb 6.4 oz (97.3 kg)  01/27/16 210 lb 3.2 oz (95.3 kg)  01/20/16 210 lb 8 oz (95.5 kg)    Physical Exam  Constitutional: No distress.  HENT:  Head: Normocephalic and atraumatic.  Mouth/Throat: Oropharynx is clear and moist. No oropharyngeal exudate.  Eyes: Conjunctivae are normal. Pupils  are equal, round, and reactive to light.  Neck: Neck supple.  Cardiovascular: Normal rate, regular rhythm and normal heart sounds.   Pulmonary/Chest: Effort normal.  Lymphadenopathy:    He has no cervical adenopathy.  Neurological: He is alert. Gait normal.  Skin: Skin is warm and dry. He is not diaphoretic.  Right dorsum radial aspect of hand with well-healing incision with no erythema or tenderness or warmth or drainage, bandages were removed, there does not appear to be any suture in place   EKG: Normal sinus rhythm, rate 74, 1 PVC noted, no apparent ischemic changes  Assessment/Plan: Please see individual problem list.  Preoperative clearance Patient presents for preoperative exam. He notes no chest pain or breathlessness. Recently had stress test and echo both of which are reassuring. EKG performed today at the request of the surgeon's office revealing PVCs though no apparent ischemic issues. Lab work will be obtained as well at the request of the surgeon's office. The patient's Lyndel Safe cardiac index score is 0.19% placing him at low risk for cardiac complications. We will obtain lab work as outlined below. Once this returns we'll determine whether we can clear him for surgery.  Visit for wound check Wound from recent skin procedure to dermatology appears to be well-healing. There are no signs of sutures. Bandages were removed. Discussed monitoring for signs of infection.   Orders Placed This Encounter  Procedures  . CBC  . Comp Met (CMET)  . HgB A1c  .  POCT Urinalysis Dipstick  . EKG 12-Lead    [name], MD [place]

## 2016-05-16 NOTE — Assessment & Plan Note (Signed)
Patient presents for preoperative exam. He notes no chest pain or breathlessness. Recently had stress test and echo both of which are reassuring. EKG performed today at the request of the surgeon's office revealing PVCs though no apparent ischemic issues. Lab work will be obtained as well at the request of the surgeon's office. The patient's Lyndel Safe cardiac index score is 0.19% placing him at low risk for cardiac complications. We will obtain lab work as outlined below. Once this returns we'll determine whether we can clear him for surgery.

## 2016-05-17 ENCOUNTER — Telehealth: Payer: Self-pay | Admitting: Family Medicine

## 2016-05-17 LAB — URINALYSIS, MICROSCOPIC ONLY

## 2016-05-17 LAB — URINE CULTURE: Organism ID, Bacteria: NO GROWTH

## 2016-05-17 NOTE — Telephone Encounter (Signed)
See result note.  

## 2016-05-17 NOTE — Telephone Encounter (Signed)
Pt called back returning your call. Thank you!  Call pt @ [phone]

## 2016-05-18 ENCOUNTER — Telehealth: Payer: Self-pay | Admitting: Family Medicine

## 2016-05-18 NOTE — Telephone Encounter (Signed)
Patient called requesting update on pre-operative clearance discussed with PCP and advised patient the form has not been completed due to labs just received and PCP has been seeing patient all day and form will be completed and would and faxed. FYI

## 2016-05-18 NOTE — Telephone Encounter (Signed)
[facility] requested to know if this form was received, she has since refaxed this form again. Please contact [place] at [phone] Ext [phone]

## 2016-05-19 NOTE — Telephone Encounter (Signed)
Form completed and placed on Cameron Foster's desk.

## 2016-05-19 NOTE — Telephone Encounter (Signed)
faxed

## 2016-05-19 NOTE — Telephone Encounter (Signed)
Patient notified form has been faxed

## 2016-05-19 NOTE — Telephone Encounter (Signed)
Form has not been completed

## 2016-05-20 ENCOUNTER — Other Ambulatory Visit: Payer: Self-pay | Admitting: Family Medicine

## 2016-05-20 DIAGNOSIS — R809 Proteinuria, unspecified: Secondary | ICD-10-CM

## 2016-05-23 ENCOUNTER — Telehealth: Payer: Self-pay | Admitting: Family Medicine

## 2016-05-23 ENCOUNTER — Other Ambulatory Visit (INDEPENDENT_AMBULATORY_CARE_PROVIDER_SITE_OTHER): Payer: Medicare Other

## 2016-05-23 DIAGNOSIS — R809 Proteinuria, unspecified: Secondary | ICD-10-CM | POA: Diagnosis not present

## 2016-05-23 LAB — POCT URINALYSIS DIPSTICK
Bilirubin, UA: NEGATIVE
Blood, UA: NEGATIVE
Glucose, UA: NEGATIVE
Ketones, UA: NEGATIVE
LEUKOCYTES UA: NEGATIVE
NITRITE UA: NEGATIVE
PH UA: 5.5
PROTEIN UA: NEGATIVE
Spec Grav, UA: <=1.005
Urobilinogen, UA: 0.2

## 2016-05-23 NOTE — Telephone Encounter (Signed)
See result note.  

## 2016-05-23 NOTE — Telephone Encounter (Signed)
Pt called back in regards to labs. Thank you!  Call pt @ [phone]

## 2016-05-23 NOTE — Progress Notes (Signed)
Patient has lab appt today 05/23/2016

## 2016-05-24 NOTE — Telephone Encounter (Signed)
error 

## 2016-05-27 ENCOUNTER — Encounter: Payer: Self-pay | Admitting: *Deleted

## 2016-05-30 ENCOUNTER — Ambulatory Visit
Admission: RE | Admit: 2016-05-30 | Discharge: 2016-05-30 | Disposition: A | Discharge status: Home / Self Care | Payer: Medicare Other | Source: Ambulatory Visit | Attending: Gastroenterology | Admitting: Gastroenterology

## 2016-05-30 ENCOUNTER — Ambulatory Visit: Payer: Medicare Other | Admitting: Anesthesiology

## 2016-05-30 ENCOUNTER — Encounter
Admission: RE | Disposition: A | Discharge status: Home / Self Care | Payer: Self-pay | Source: Ambulatory Visit | Attending: Gastroenterology

## 2016-05-30 DIAGNOSIS — Z1211 Encounter for screening for malignant neoplasm of colon: Secondary | ICD-10-CM | POA: Insufficient documentation

## 2016-05-30 DIAGNOSIS — K648 Other hemorrhoids: Secondary | ICD-10-CM | POA: Insufficient documentation

## 2016-05-30 DIAGNOSIS — Z86010 Personal history of colonic polyps: Secondary | ICD-10-CM | POA: Diagnosis not present

## 2016-05-30 DIAGNOSIS — Z86718 Personal history of other venous thrombosis and embolism: Secondary | ICD-10-CM | POA: Diagnosis not present

## 2016-05-30 DIAGNOSIS — Z87891 Personal history of nicotine dependence: Secondary | ICD-10-CM | POA: Insufficient documentation

## 2016-05-30 DIAGNOSIS — K3189 Other diseases of stomach and duodenum: Secondary | ICD-10-CM | POA: Diagnosis not present

## 2016-05-30 DIAGNOSIS — K5732 Diverticulitis of large intestine without perforation or abscess without bleeding: Secondary | ICD-10-CM | POA: Diagnosis not present

## 2016-05-30 DIAGNOSIS — R1013 Epigastric pain: Secondary | ICD-10-CM | POA: Insufficient documentation

## 2016-05-30 DIAGNOSIS — K259 Gastric ulcer, unspecified as acute or chronic, without hemorrhage or perforation: Secondary | ICD-10-CM | POA: Insufficient documentation

## 2016-05-30 DIAGNOSIS — Z791 Long term (current) use of non-steroidal anti-inflammatories (NSAID): Secondary | ICD-10-CM | POA: Insufficient documentation

## 2016-05-30 DIAGNOSIS — Z8719 Personal history of other diseases of the digestive system: Secondary | ICD-10-CM | POA: Diagnosis present

## 2016-05-30 DIAGNOSIS — Z888 Allergy status to other drugs, medicaments and biological substances status: Secondary | ICD-10-CM | POA: Insufficient documentation

## 2016-05-30 DIAGNOSIS — I4891 Unspecified atrial fibrillation: Secondary | ICD-10-CM | POA: Insufficient documentation

## 2016-05-30 DIAGNOSIS — K210 Gastro-esophageal reflux disease with esophagitis: Secondary | ICD-10-CM | POA: Insufficient documentation

## 2016-05-30 DIAGNOSIS — K5730 Diverticulosis of large intestine without perforation or abscess without bleeding: Secondary | ICD-10-CM | POA: Diagnosis not present

## 2016-05-30 DIAGNOSIS — K5790 Diverticulosis of intestine, part unspecified, without perforation or abscess without bleeding: Secondary | ICD-10-CM | POA: Diagnosis not present

## 2016-05-30 DIAGNOSIS — K2950 Unspecified chronic gastritis without bleeding: Secondary | ICD-10-CM | POA: Diagnosis not present

## 2016-05-30 DIAGNOSIS — Z923 Personal history of irradiation: Secondary | ICD-10-CM | POA: Diagnosis not present

## 2016-05-30 DIAGNOSIS — F1020 Alcohol dependence, uncomplicated: Secondary | ICD-10-CM | POA: Diagnosis not present

## 2016-05-30 DIAGNOSIS — B9681 Helicobacter pylori [H. pylori] as the cause of diseases classified elsewhere: Secondary | ICD-10-CM | POA: Insufficient documentation

## 2016-05-30 DIAGNOSIS — K589 Irritable bowel syndrome without diarrhea: Secondary | ICD-10-CM | POA: Diagnosis not present

## 2016-05-30 DIAGNOSIS — K2960 Other gastritis without bleeding: Secondary | ICD-10-CM | POA: Diagnosis not present

## 2016-05-30 DIAGNOSIS — Z8546 Personal history of malignant neoplasm of prostate: Secondary | ICD-10-CM | POA: Diagnosis not present

## 2016-05-30 DIAGNOSIS — I10 Essential (primary) hypertension: Secondary | ICD-10-CM | POA: Insufficient documentation

## 2016-05-30 DIAGNOSIS — Z538 Procedure and treatment not carried out for other reasons: Secondary | ICD-10-CM | POA: Diagnosis not present

## 2016-05-30 DIAGNOSIS — K2970 Gastritis, unspecified, without bleeding: Secondary | ICD-10-CM | POA: Diagnosis not present

## 2016-05-30 DIAGNOSIS — E785 Hyperlipidemia, unspecified: Secondary | ICD-10-CM | POA: Insufficient documentation

## 2016-05-30 DIAGNOSIS — K228 Other specified diseases of esophagus: Secondary | ICD-10-CM | POA: Diagnosis not present

## 2016-05-30 DIAGNOSIS — J45909 Unspecified asthma, uncomplicated: Secondary | ICD-10-CM | POA: Diagnosis not present

## 2016-05-30 DIAGNOSIS — K2900 Acute gastritis without bleeding: Secondary | ICD-10-CM | POA: Diagnosis not present

## 2016-05-30 DIAGNOSIS — Z79899 Other long term (current) drug therapy: Secondary | ICD-10-CM | POA: Insufficient documentation

## 2016-05-30 HISTORY — PX: COLONOSCOPY WITH PROPOFOL: SHX5780

## 2016-05-30 HISTORY — PX: ESOPHAGOGASTRODUODENOSCOPY: SHX5428

## 2016-05-30 SURGERY — EGD (ESOPHAGOGASTRODUODENOSCOPY)
Anesthesia: General

## 2016-05-30 MED ORDER — FENTANYL CITRATE (PF) 100 MCG/2ML IJ SOLN
INTRAMUSCULAR | Status: DC | PRN
  Administered 2016-05-30: 50 ug via INTRAVENOUS

## 2016-05-30 MED ORDER — LIDOCAINE HCL (PF) 2 % IJ SOLN
INTRAMUSCULAR | Status: AC
  Filled 2016-05-30: qty 2

## 2016-05-30 MED ORDER — SODIUM CHLORIDE 0.9 % IV SOLN
INTRAVENOUS | Status: DC
  Administered 2016-05-30: 11:00:00 via INTRAVENOUS

## 2016-05-30 MED ORDER — LIDOCAINE HCL (CARDIAC) 20 MG/ML IV SOLN
INTRAVENOUS | Status: DC | PRN
  Administered 2016-05-30: 100 mg via INTRAVENOUS

## 2016-05-30 MED ORDER — PROPOFOL 10 MG/ML IV BOLUS
INTRAVENOUS | Status: DC | PRN
  Administered 2016-05-30: 80 mg via INTRAVENOUS

## 2016-05-30 MED ORDER — PROPOFOL 500 MG/50ML IV EMUL
INTRAVENOUS | Status: AC
  Filled 2016-05-30: qty 50

## 2016-05-30 MED ORDER — PROPOFOL 10 MG/ML IV BOLUS
INTRAVENOUS | Status: AC
  Filled 2016-05-30: qty 20

## 2016-05-30 MED ORDER — SODIUM CHLORIDE 0.9 % IV SOLN: INTRAVENOUS | Status: DC

## 2016-05-30 MED ORDER — PROPOFOL 500 MG/50ML IV EMUL
INTRAVENOUS | Status: DC | PRN
  Administered 2016-05-30: 140 ug/kg/min via INTRAVENOUS

## 2016-05-30 MED ORDER — PHENYLEPHRINE HCL 10 MG/ML IJ SOLN
INTRAMUSCULAR | Status: DC | PRN
  Administered 2016-05-30 (×11): 100 ug via INTRAVENOUS

## 2016-05-30 MED ORDER — FENTANYL CITRATE (PF) 100 MCG/2ML IJ SOLN
INTRAMUSCULAR | Status: AC
Start: 2016-05-30 — End: 2016-05-30
  Filled 2016-05-30: qty 2

## 2016-05-30 NOTE — H&P (Signed)
Outpatient short stay form Pre-procedure 05/30/2016 10:34 AM Lollie Sails MD  Primary Physician: Dr. [name]  Reason for visit:  EGD and colonoscopy  History of present illness:  Patient is a 76 year old male presenting today as above. He is been having problems with epigastric pain. He was taking a proton pump inhibitor but only occasionally. Since he started taking it daily some of this of this has improved. He does take occasional BC powders he also takes mobile. He denies use of any other aspirin products or blood thinning agents. He tolerated his prep.    Current Facility-Administered Medications:  .  0.9 %  sodium chloride infusion, , Intravenous, Continuous, Lollie Sails, MD, Last Rate: 20 mL/hr at 05/30/16 1034 .  0.9 %  sodium chloride infusion, , Intravenous, Continuous, Lollie Sails, MD  Prescriptions Prior to Admission  Medication Sig Dispense Refill Last Dose  . amLODipine (NORVASC) 5 MG tablet Take 1 tablet (5 mg total) by mouth daily. 90 tablet 3 05/29/2016 at Unknown time  . hydrocortisone 2.5 % cream Apply topically 2 (two) times daily as needed. For rash 28 g 1 Past Month at Unknown time  . lisinopril (PRINIVIL,ZESTRIL) 10 MG tablet Take 1 tablet (10 mg total) by mouth daily. 90 tablet 3 05/30/2016 at Unknown time  . metoprolol succinate (TOPROL-XL) 25 MG 24 hr tablet Take 1 tablet (25 mg total) by mouth daily. 90 tablet 3 05/30/2016 at Unknown time  . sertraline (ZOLOFT) 50 MG tablet Take 1 tablet (50 mg total) by mouth daily. 90 tablet 3 05/29/2016 at Unknown time  . triamcinolone cream (KENALOG) 0.1 % Apply 1 application topically 2 (two) times daily as needed. For itching 30 g 0 Past Month at Unknown time  . atenolol (TENORMIN) 25 MG tablet Take 25 mg by mouth daily.   Not Taking at Unknown time  . citalopram (CELEXA) 20 MG tablet Take 20 mg by mouth daily.   Not Taking at Unknown time  . meloxicam (MOBIC) 15 MG tablet Take 15 mg by mouth daily.   Not  Taking at Unknown time  . Multiple Vitamin (MULTIVITAMIN) tablet Take 1 tablet by mouth daily.   05/27/2016  . omeprazole (PRILOSEC) 40 MG capsule TAKE ONE CAPSULE BY MOUTH ONCE DAILY 30 capsule 3 05/27/2016  . polyethylene glycol powder (GLYCOLAX/MIRALAX) powder Take 17 g by mouth 2 (two) times daily as needed. 3350 g 1 Taking  . sennosides-docusate sodium (SENOKOT-S) 8.6-50 MG tablet Take 2 tablets by mouth every morning.   Taking     Allergies  Allergen Reactions  . Formaldehyde Other (See Comments)     Past Medical History:  Diagnosis Date  . Alcoholism ([place])   . Arrhythmia   . Arthritis   . Asthma   . Atrial fibrillation ([place])    one episode  . Colon polyp   . Diverticulitis   . Diverticulosis 30 years  . Fainting   . History of blood clots   . Hyperlipidemia   . Hypertension   . Irritable bowel syndrome   . Prostate cancer ([place]) 2012   treated with radiation therapy.  . Vertigo     Review of systems:      Physical Exam    Heart and lungs: Regular rate and rhythm without rub or gallop, lungs are bilaterally clear.    HEENT: Normocephalic atraumatic eyes are anicteric    Other:     Pertinant exam for procedure: Soft nontender nondistended bowel sounds positive normoactive  Planned proceedures: EGD, colonoscopy and indicated procedures. I have discussed the risks benefits and complications of procedures to include not limited to bleeding, infection, perforation and the risk of sedation and the patient wishes to proceed.    Lollie Sails, MD Gastroenterology 05/30/2016  10:34 AM

## 2016-05-30 NOTE — Anesthesia Preprocedure Evaluation (Signed)
Anesthesia Evaluation  Patient identified by MRN, date of birth, ID band Patient awake    Reviewed: Allergy & Precautions, NPO status , Patient's Chart, lab work & pertinent test results  History of Anesthesia Complications Negative for: history of anesthetic complications  Airway Mallampati: II  TM Distance: >3 FB Neck ROM: Full    Dental no notable dental hx.    Pulmonary asthma , neg sleep apnea, former smoker,    breath sounds clear to auscultation- rhonchi (-) wheezing      Cardiovascular Exercise Tolerance: Good hypertension, Pt. on medications (-) CAD and (-) Past MI  Rhythm:Regular Rate:Normal - Systolic murmurs and - Diastolic murmurs    Neuro/Psych PSYCHIATRIC DISORDERS Anxiety negative neurological ROS     GI/Hepatic negative GI ROS, Neg liver ROS,   Endo/Other  negative endocrine ROSneg diabetes  Renal/GU negative Renal ROS     Musculoskeletal  (+) Arthritis ,   Abdominal (+) + obese,   Peds  Hematology negative hematology ROS (+)   Anesthesia Other Findings Past Medical History: No date: Alcoholism ([place]) No date: Arrhythmia No date: Arthritis No date: Asthma No date: Atrial fibrillation ([facility])     Comment: one episode No date: Colon polyp No date: Diverticulitis 30 years: Diverticulosis No date: Fainting No date: History of blood clots No date: Hyperlipidemia No date: Hypertension No date: Irritable bowel syndrome 2012: Prostate cancer ([place])     Comment: treated with radiation therapy. No date: Vertigo   Reproductive/Obstetrics                             Anesthesia Physical Anesthesia Plan  ASA: III  Anesthesia Plan: General   Post-op Pain Management:    Induction: Intravenous  Airway Management Planned: Natural Airway  Additional Equipment:   Intra-op Plan:   Post-operative Plan:   Informed Consent: I have reviewed the patients History and  Physical, chart, labs and discussed the procedure including the risks, benefits and alternatives for the proposed anesthesia with the patient or authorized representative who has indicated his/her understanding and acceptance.   Dental advisory given  Plan Discussed with: CRNA and Anesthesiologist  Anesthesia Plan Comments:         Anesthesia Quick Evaluation

## 2016-05-30 NOTE — Anesthesia Post-op Follow-up Note (Cosign Needed)
Anesthesia QCDR form completed.        

## 2016-05-30 NOTE — Transfer of Care (Signed)
Immediate Anesthesia Transfer of Care Note  Patient: Cameron Foster.  Procedure(s) Performed: Procedure(s): ESOPHAGOGASTRODUODENOSCOPY (EGD) (N/A) COLONOSCOPY WITH PROPOFOL (N/A)  Patient Location: PACU  Anesthesia Type:General  Level of Consciousness: sedated and patient cooperative  Airway & Oxygen Therapy: Patient Spontanous Breathing and Patient connected to nasal cannula oxygen  Post-op Assessment: Report given to RN, Post -op Vital signs reviewed and stable and Patient moving all extremities  Post vital signs: Reviewed and stable  Last Vitals:  Vitals:   05/30/16 1014 05/30/16 1120  BP: 131/87 104/60  Pulse: 75 75  Resp: 18 18  Temp: 36.2 C 36.2 C    Last Pain:  Vitals:   05/30/16 1120  TempSrc: Tympanic         Complications: No apparent anesthesia complications

## 2016-05-30 NOTE — Op Note (Signed)
[facility] Patient Name: Cameron Foster Procedure Date: 05/30/2016 10:27 AM MRN: [id number] Account #: [id number] Date of Birth: 07/14/1940 Admit Type: Outpatient Age: 76 Room: [name] ENDO ROOM 3 Gender: Male Note Status: Finalized Procedure:            Upper GI endoscopy Indications:          Epigastric abdominal pain Providers:            Lollie Sails, MD Referring MD:         Randall Hiss g. Caryl Bis (Referring MD) Medicines:            Monitored Anesthesia Care Complications:        No immediate complications. Procedure:            Pre-Anesthesia Assessment:                       - ASA Grade Assessment: III - A patient with severe                        systemic disease.                       After obtaining informed consent, the endoscope was                        passed under direct vision. Throughout the procedure,                        the patient's blood pressure, pulse, and oxygen                        saturations were monitored continuously. The Endoscope                        was introduced through the mouth, and advanced to the                        third part of duodenum. The upper GI endoscopy was                        accomplished without difficulty. The patient tolerated                        the procedure well. Findings:      The Z-line was variable. Biopsies were taken with a cold forceps for       histology.      No evidence of esophageal varices.      The exam of the esophagus was otherwise normal.      Diffuse mild inflammation characterized by congestion (edema) and       erythema was found in the gastric body. In the upper body of the stomach       there is the appearance of a mild portal gastropathy versus gastritis,       no evidence of gastric varices.      The cardia and gastric fundus were normal on retroflexion.      A deformity was found at the incisura.      Three non-bleeding superficial/linear gastric  ulcers with no stigmata of       bleeding were found at the incisura and in the pyloric channel. The  largest lesion was 4 mm in largest dimension. Biopsies were taken with a       cold forceps for histology. Biopsies were taken with a cold forceps for       Helicobacter pylori testing.      The in the duodenum was normal. Impression:           - Z-line variable. Biopsied.                       - Gastritis.                       - Acquired deformity in the incisura.                       - Non-bleeding gastric ulcers with no stigmata of                        bleeding. Biopsied.                       - Normal. Recommendation:       - Discharge patient to home.                       - Perform a colonoscopy today. Procedure Code(s):    --- Professional ---                       [phone], Esophagogastroduodenoscopy, flexible, transoral;                        with biopsy, single or multiple Diagnosis Code(s):    --- Professional ---                       K22.8, Other specified diseases of esophagus                       K29.70, Gastritis, unspecified, without bleeding                       K31.89, Other diseases of stomach and duodenum                       K25.9, Gastric ulcer, unspecified as acute or chronic,                        without hemorrhage or perforation                       R10.13, Epigastric pain CPT copyright 2016 American [employer]. All rights reserved. The codes documented in this report are preliminary and upon coder review may  be revised to meet current compliance requirements. Lollie Sails, MD 05/30/2016 11:09:35 AM This report has been signed electronically. Number of Addenda: 0 Note Initiated On: 05/30/2016 10:27 AM      [facility]

## 2016-05-30 NOTE — Anesthesia Postprocedure Evaluation (Signed)
Anesthesia Post Note  Patient: Cameron Foster.  Procedure(s) Performed: Procedure(s) (LRB): ESOPHAGOGASTRODUODENOSCOPY (EGD) (N/A) COLONOSCOPY WITH PROPOFOL (N/A)  Patient location during evaluation: Endoscopy Anesthesia Type: General Level of consciousness: awake and alert and oriented Pain management: pain level controlled Vital Signs Assessment: post-procedure vital signs reviewed and stable Respiratory status: spontaneous breathing, nonlabored ventilation and respiratory function stable Cardiovascular status: blood pressure returned to baseline and stable Postop Assessment: no signs of nausea or vomiting Anesthetic complications: no     Last Vitals:  Vitals:   05/30/16 1130 05/30/16 1140  BP: 104/60 101/79  Pulse: (!) 56 63  Resp: 10 11  Temp:      Last Pain:  Vitals:   05/30/16 1120  TempSrc: Tympanic                 Reet Scharrer

## 2016-05-30 NOTE — Op Note (Addendum)
[facility] Patient Name: Cameron Foster Procedure Date: 05/30/2016 10:24 AM MRN: [id number] Account #: [id number] Date of Birth: May 02, 1940 Admit Type: Outpatient Age: 76 Room: [facility] ENDO ROOM 3 Gender: Male Note Status: Finalized Procedure:            Colonoscopy Indications:          Personal history of colonic polyps, Follow-up of                        diverticulitis Providers:            Lollie Sails, MD Referring MD:         Randall Hiss g. Caryl Bis (Referring MD) Medicines:            Monitored Anesthesia Care Complications:        No immediate complications. Procedure:            Pre-Anesthesia Assessment:                       - ASA Grade Assessment: III - A patient with severe                        systemic disease.                       After obtaining informed consent, the colonoscope was                        passed under direct vision. Throughout the procedure,                        the patient's blood pressure, pulse, and oxygen                        saturations were monitored continuously. The                        Colonoscope was introduced through the anus with the                        intention of advancing to the cecum. The scope was                        advanced to the sigmoid colon before the procedure was                        aborted. Medications were given. The colonoscopy was                        extremely difficult due to multiple diverticula in the                        colon, restricted mobility of the colon and a tortuous                        colon. Findings:      Multiple small and large-mouthed diverticula were found in the sigmoid       colon. The scope was carefully advanced to about 30 cm. Several acute       angles were encountered with multiple large diverticuli. A very sharp  angulation is noted at about 30 cm from the anal verge. I was unable to       advance further despite position  change and abdominal support.      Non-bleeding internal hemorrhoids were found during retroflexion and       during anoscopy. The hemorrhoids were medium-sized.      The digital rectal exam was normal. Impression:           - Diverticulosis in the sigmoid colon.                       - Non-bleeding internal hemorrhoids.                       - No specimens collected. Recommendation:       - Discharge patient to home.                       - Perform a virtual colonoscopy at appointment to be                        scheduled. Procedure Code(s):    --- Professional ---                       [id number], 53, Colonoscopy, flexible; diagnostic, including                        collection of specimen(s) by brushing or washing, when                        performed (separate procedure) Diagnosis Code(s):    --- Professional ---                       K64.8, Other hemorrhoids                       Z86.010, Personal history of colonic polyps                       K57.32, Diverticulitis of large intestine without                        perforation or abscess without bleeding                       K57.30, Diverticulosis of large intestine without                        perforation or abscess without bleeding CPT copyright 2016 American [employer] Association. All rights reserved. The codes documented in this report are preliminary and upon coder review may  be revised to meet current compliance requirements. Lollie Sails, MD 05/30/2016 11:30:04 AM This report has been signed electronically. Number of Addenda: 0 Note Initiated On: 05/30/2016 10:24 AM Total Procedure Duration: 0 hours 12 minutes 14 seconds       [facility]

## 2016-05-31 ENCOUNTER — Encounter: Payer: Self-pay | Admitting: Gastroenterology

## 2016-05-31 LAB — SURGICAL PATHOLOGY

## 2016-06-02 ENCOUNTER — Other Ambulatory Visit: Payer: Self-pay | Admitting: Specialist

## 2016-06-02 ENCOUNTER — Encounter: Payer: Self-pay | Admitting: *Deleted

## 2016-06-03 ENCOUNTER — Other Ambulatory Visit: Payer: Self-pay | Admitting: Gastroenterology

## 2016-06-03 DIAGNOSIS — Q438 Other specified congenital malformations of intestine: Secondary | ICD-10-CM

## 2016-06-05 ENCOUNTER — Encounter: Payer: Self-pay | Admitting: Family Medicine

## 2016-06-15 ENCOUNTER — Emergency Department: Payer: Medicare Other

## 2016-06-15 ENCOUNTER — Emergency Department
Admission: EM | Admit: 2016-06-15 | Discharge: 2016-06-15 | Disposition: A | Discharge status: Home / Self Care | Payer: Medicare Other | Attending: Emergency Medicine | Admitting: Emergency Medicine

## 2016-06-15 DIAGNOSIS — Y939 Activity, unspecified: Secondary | ICD-10-CM | POA: Insufficient documentation

## 2016-06-15 DIAGNOSIS — R109 Unspecified abdominal pain: Secondary | ICD-10-CM | POA: Diagnosis not present

## 2016-06-15 DIAGNOSIS — S2242XA Multiple fractures of ribs, left side, initial encounter for closed fracture: Secondary | ICD-10-CM

## 2016-06-15 DIAGNOSIS — J45909 Unspecified asthma, uncomplicated: Secondary | ICD-10-CM | POA: Diagnosis not present

## 2016-06-15 DIAGNOSIS — R1012 Left upper quadrant pain: Secondary | ICD-10-CM | POA: Diagnosis not present

## 2016-06-15 DIAGNOSIS — Y929 Unspecified place or not applicable: Secondary | ICD-10-CM | POA: Insufficient documentation

## 2016-06-15 DIAGNOSIS — S0990XA Unspecified injury of head, initial encounter: Secondary | ICD-10-CM | POA: Diagnosis not present

## 2016-06-15 DIAGNOSIS — I10 Essential (primary) hypertension: Secondary | ICD-10-CM | POA: Insufficient documentation

## 2016-06-15 DIAGNOSIS — W182XXA Fall in (into) shower or empty bathtub, initial encounter: Secondary | ICD-10-CM | POA: Insufficient documentation

## 2016-06-15 DIAGNOSIS — F17220 Nicotine dependence, chewing tobacco, uncomplicated: Secondary | ICD-10-CM | POA: Diagnosis not present

## 2016-06-15 DIAGNOSIS — Y999 Unspecified external cause status: Secondary | ICD-10-CM | POA: Diagnosis not present

## 2016-06-15 DIAGNOSIS — S299XXA Unspecified injury of thorax, initial encounter: Secondary | ICD-10-CM | POA: Diagnosis present

## 2016-06-15 DIAGNOSIS — S199XXA Unspecified injury of neck, initial encounter: Secondary | ICD-10-CM | POA: Diagnosis not present

## 2016-06-15 LAB — LIPASE, BLOOD: LIPASE: 44 U/L (ref 11–51)

## 2016-06-15 LAB — CBC WITH DIFFERENTIAL/PLATELET
BASOS ABS: 0.1 10*3/uL (ref 0–0.1)
Basophils Relative: 1 %
EOS ABS: 0.2 10*3/uL (ref 0–0.7)
EOS PCT: 3 %
HCT: 37.8 % — ABNORMAL LOW (ref 40.0–52.0)
HEMOGLOBIN: 12.6 g/dL — AB (ref 13.0–18.0)
LYMPHS PCT: 11 %
Lymphs Abs: 1 10*3/uL (ref 1.0–3.6)
MCH: 28.8 pg (ref 26.0–34.0)
MCHC: 33.4 g/dL (ref 32.0–36.0)
MCV: 86.1 fL (ref 80.0–100.0)
Monocytes Absolute: 0.7 10*3/uL (ref 0.2–1.0)
Monocytes Relative: 8 %
NEUTROS PCT: 77 %
Neutro Abs: 7.2 10*3/uL — ABNORMAL HIGH (ref 1.4–6.5)
PLATELETS: 257 10*3/uL (ref 150–440)
RBC: 4.39 MIL/uL — AB (ref 4.40–5.90)
RDW: 13.6 % (ref 11.5–14.5)
WBC: 9.2 10*3/uL (ref 3.8–10.6)

## 2016-06-15 LAB — COMPREHENSIVE METABOLIC PANEL
ALT: 17 U/L (ref 17–63)
ANION GAP: 8 (ref 5–15)
AST: 40 U/L (ref 15–41)
Albumin: 3.9 g/dL (ref 3.5–5.0)
Alkaline Phosphatase: 67 U/L (ref 38–126)
BUN: 11 mg/dL (ref 6–20)
CALCIUM: 9.3 mg/dL (ref 8.9–10.3)
CO2: 25 mmol/L (ref 22–32)
CREATININE: 1.25 mg/dL — AB (ref 0.61–1.24)
Chloride: 108 mmol/L (ref 101–111)
GFR, EST NON AFRICAN AMERICAN: 55 mL/min — AB (ref >60)
Glucose, Bld: 116 mg/dL — ABNORMAL HIGH (ref 65–99)
Potassium: 3.4 mmol/L — ABNORMAL LOW (ref 3.5–5.1)
SODIUM: 141 mmol/L (ref 135–145)
Total Bilirubin: 0.7 mg/dL (ref 0.3–1.2)
Total Protein: 7.5 g/dL (ref 6.5–8.1)

## 2016-06-15 IMAGING — CT CT HEAD W/O CM
5 of 7 series · 16 of 45 positions shown, 17 images · non-contrast
Comparison: None.

CLINICAL DATA: Fall onto bathtub with blunt trauma. Posterior head
injury and midline cervical spine tenderness.

EXAM:
CT HEAD WITHOUT CONTRAST
CT CERVICAL SPINE WITHOUT CONTRAST
TECHNIQUE: Multidetector CT imaging of the head and cervical spine was
performed following the standard protocol without intravenous
contrast. Multiplanar CT image reconstructions of the cervical spine
were also generated.

[Series 2: head wo · axial · 0.43mm/px · z∈[+28,+78]mm · 2 of 30 slices shown, 3 images]
[im 10/30  brain]
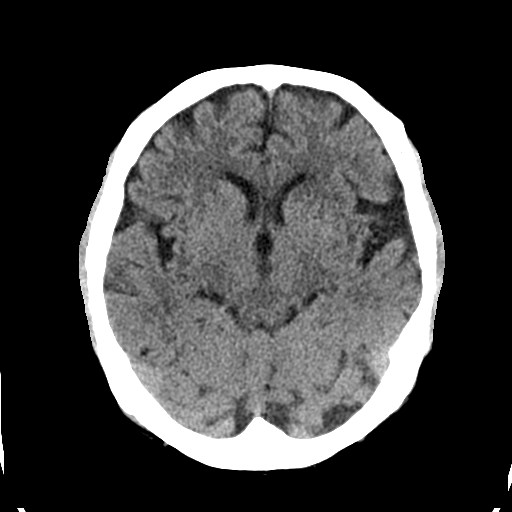
[im 10/30  bone]
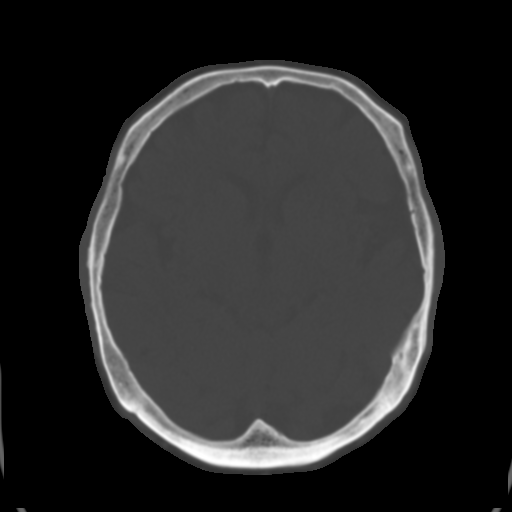
[im 20/30  brain]
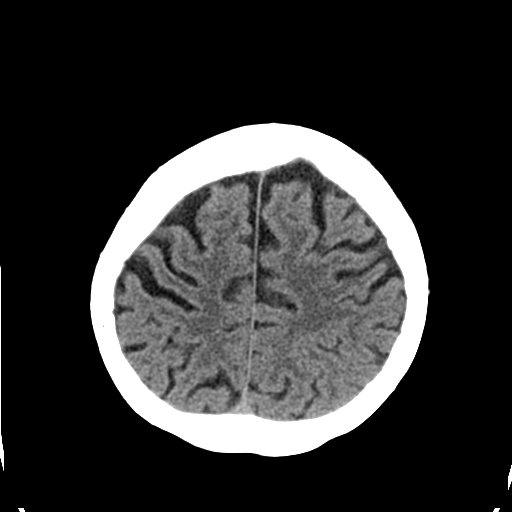

[Series 4: coronal soft tissue · coronal · 0.29mm/px · 2 of 60 slices shown]
[im 2/60  brain]
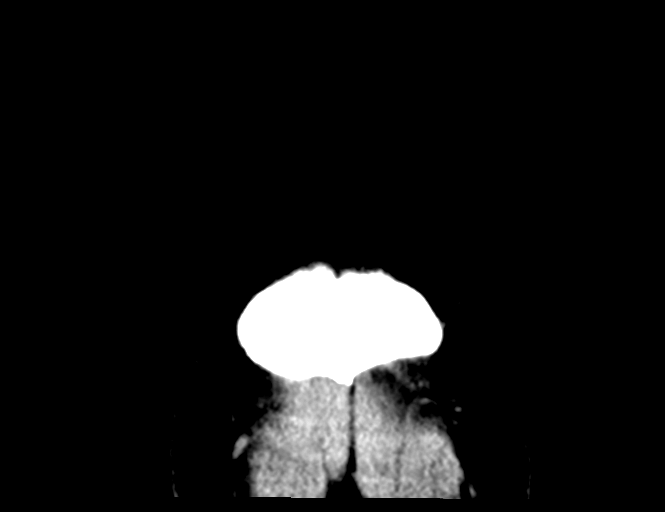
[im 3/60  brain]
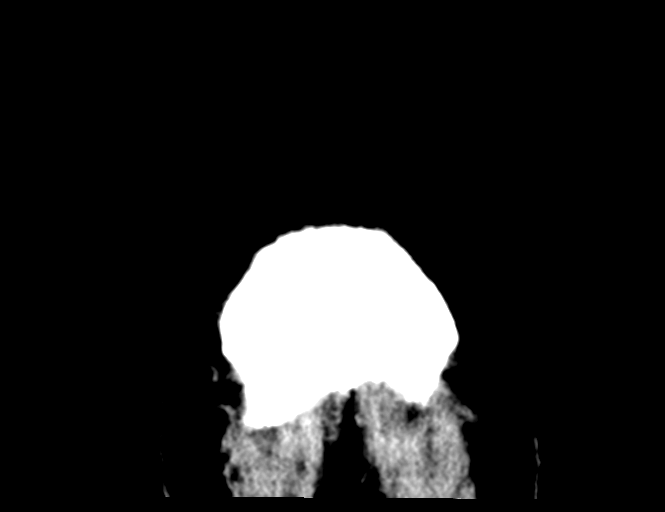

[Series 5: sagittal soft tissue · sagittal · 0.29mm/px · 1 of 54 slices shown]
[im 27/54  brain]
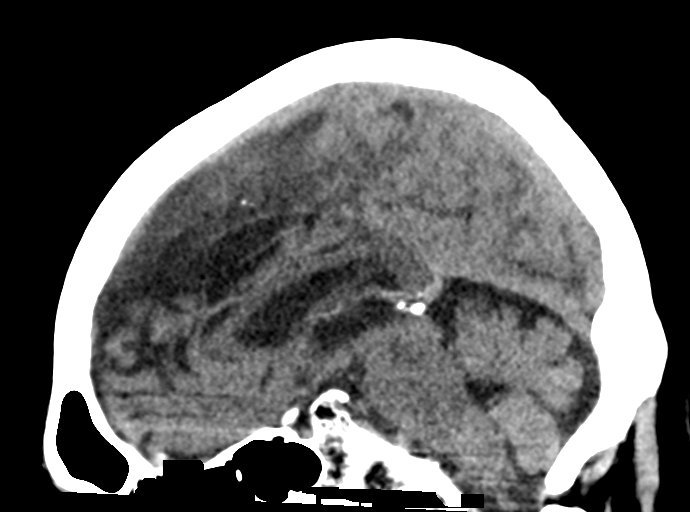

[Series 7: c spine soft · axial · 0.31mm/px · z∈[-176,-128]mm · 3 of 99 slices shown]
[im 9/99  brain]
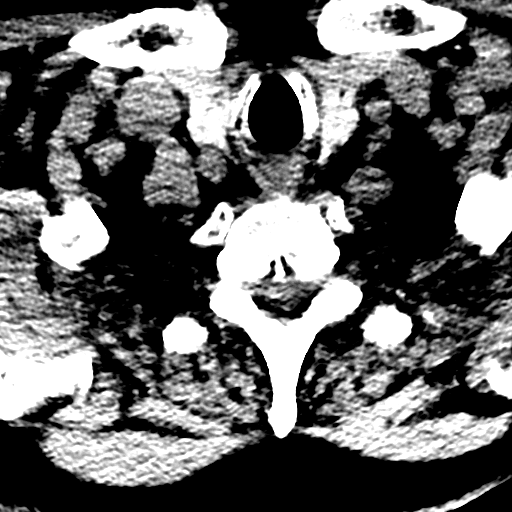
[im 25/99  brain]
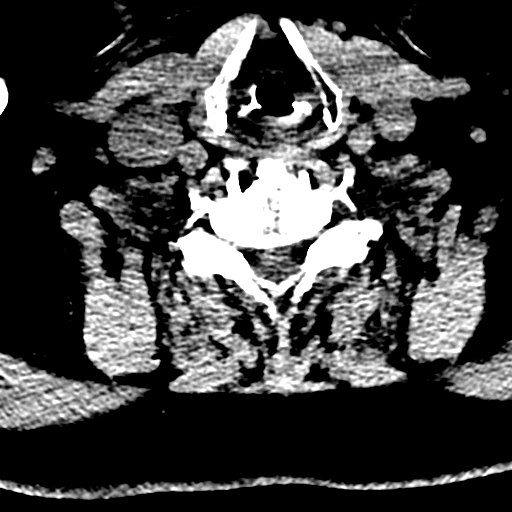
[im 33/99  brain]
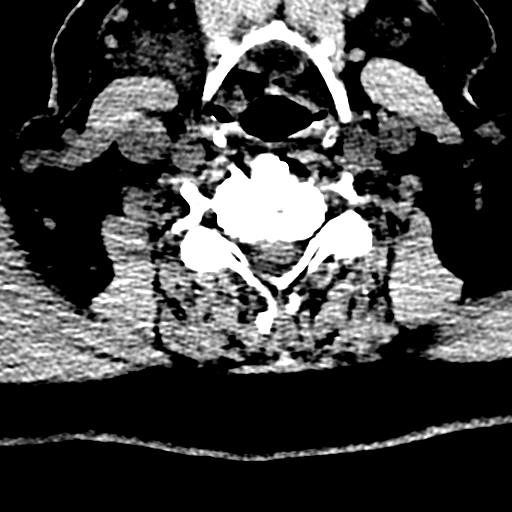

[Series 10: orthogonal bone · axial · 0.23mm/px · z∈[-190,-37]mm · 8 of 98 slices shown]
[im 9/98  bone]
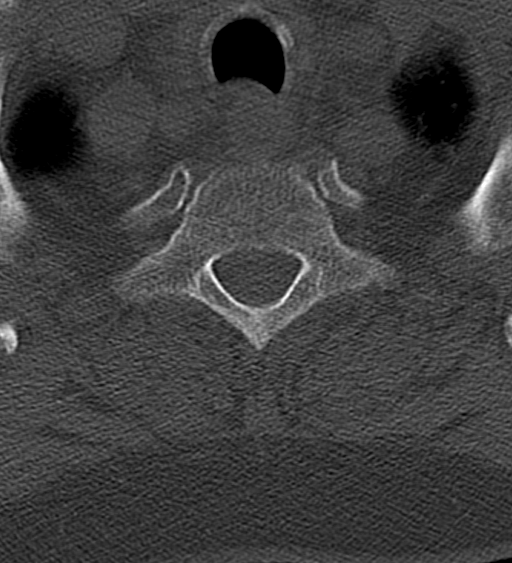
[im 25/98  bone]
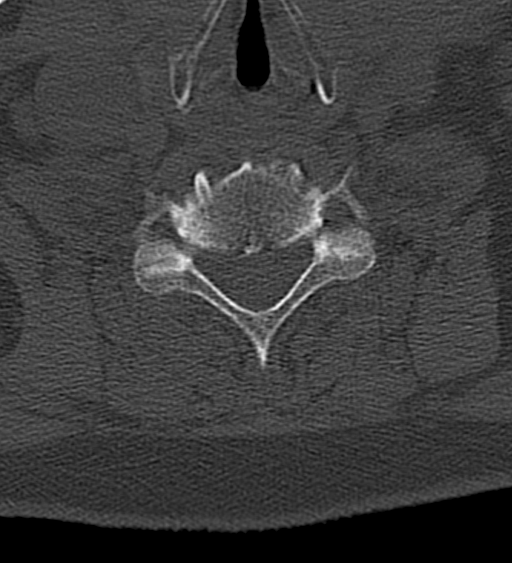
[im 33/98  bone]
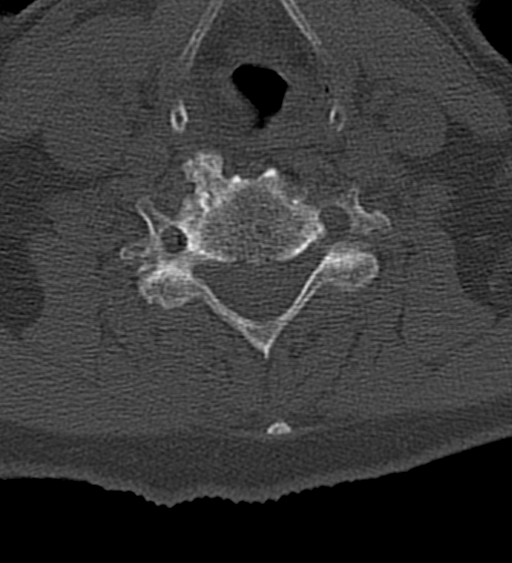
[im 41/98  bone]
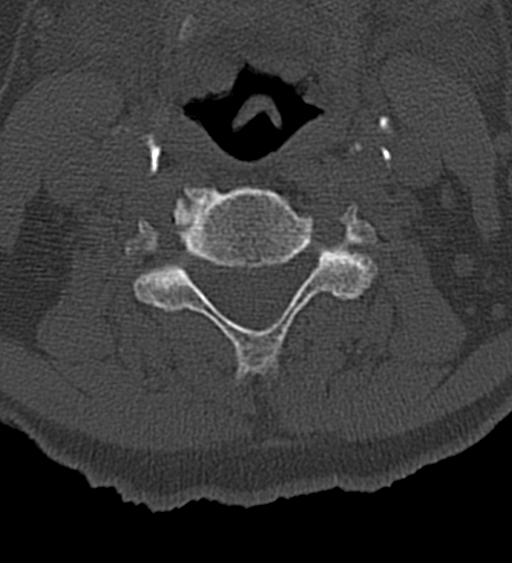
[im 57/98  bone]
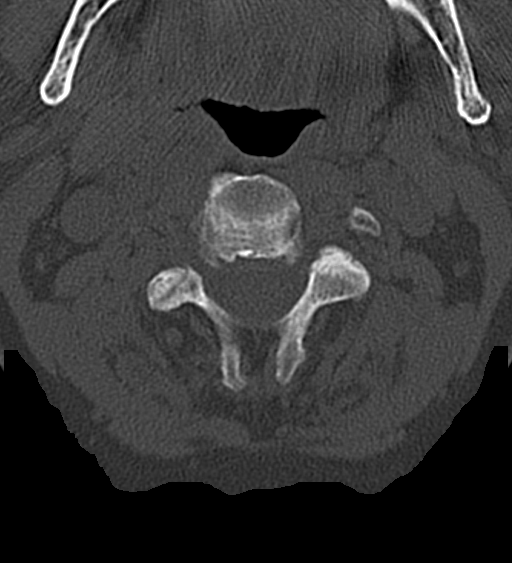
[im 65/98  bone]
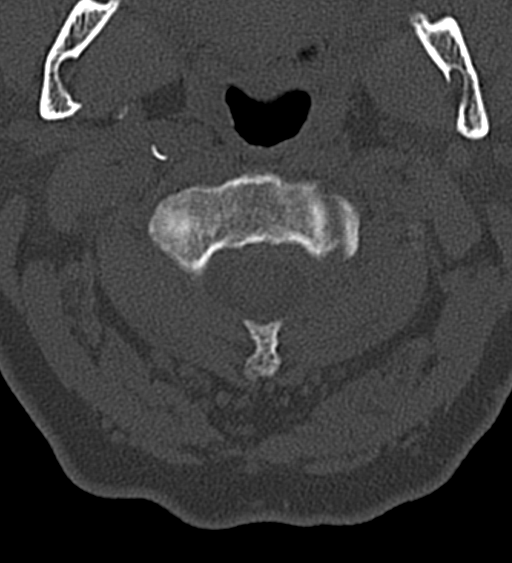
[im 73/98  bone]
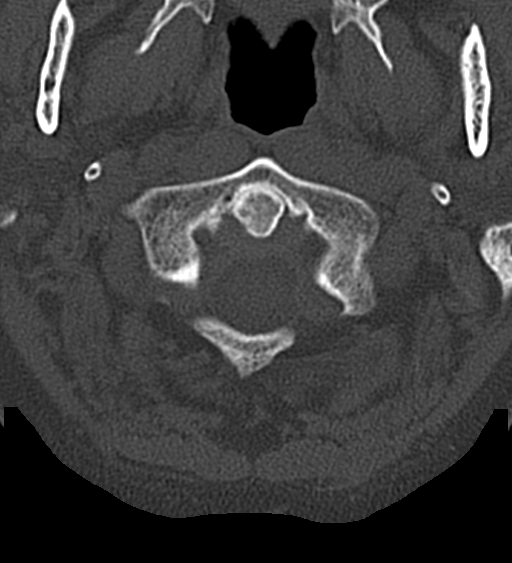
[im 89/98  bone]
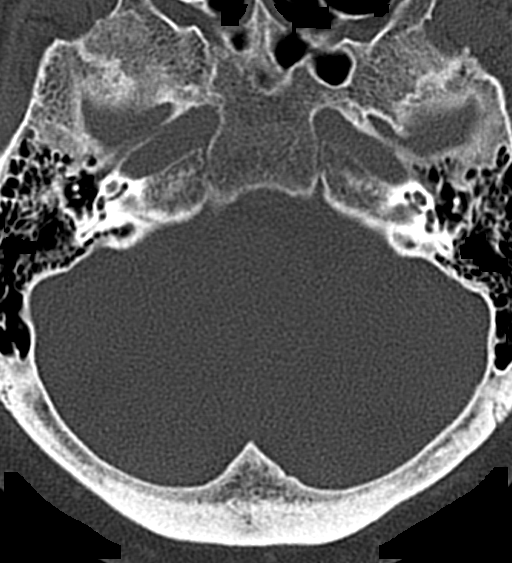

[16 of 45 positions shown; findings below may reference images not displayed]

FINDINGS: CT HEAD FINDINGS

Brain: No evidence of acute infarction, hemorrhage, hydrocephalus,
extra-axial collection or mass lesion/mass effect. Remote lacunar
infarct in the right basal ganglia and bilateral cerebellum. Mild
generalized atrophy and chronic small vessel ischemia.

Vascular: Atherosclerosis of skullbase vasculature without
hyperdense vessel or abnormal calcification.

Skull: Normal. Negative for fracture or focal lesion.

Sinuses/Orbits: Paranasal sinuses and mastoid air cells are clear.
Presumed bilateral cataract resection.

Other: No large scalp hematoma.

CT CERVICAL SPINE FINDINGS

Alignment: 2 mm anterolisthesis of C2 on C3 appears degenerative.
Less than 2 mm anterolisthesis of C6 on C7 appears degenerative. No
evidence of traumatic malalignment, jumped or perched facets.

Skull base and vertebrae: No acute fracture or suspicious focal
lesion. The dens is intact.

Soft tissues and spinal canal: No prevertebral fluid or swelling. No
visible canal hematoma.

Disc levels: Disc space narrowing and endplate spurring throughout,
most prominent C3-C4 through C5-C6. Degenerative change at the C1-C2
articulation. Scattered facet arthropathy and neural foraminal
narrowing.

Upper chest: No evident acute abnormality. Chest CT performed
concurrently.

Other: Carotid vascular calcifications.
IMPRESSION: 1. No acute intracranial abnormality. Atrophy with chronic small
vessel ischemia and remote lacunar infarcts.
2. Multilevel degenerative change throughout the cervical spine
without acute fracture or subluxation.

## 2016-06-15 IMAGING — CT CT ABD-PELV W/ CM
2 of 5 series · 12 of 36 positions shown, 15 images · IV contrast (iopamidol)
Comparison: CT Abdomen and Pelvis [DATE] and earlier.

CLINICAL DATA: 75-year-old male status post fall in shower against
bathtub. Blunt trauma with right rib cage and abdominal pain.
Initial encounter.

EXAM:
CT CHEST, ABDOMEN, AND PELVIS WITH CONTRAST
TECHNIQUE: Multidetector CT imaging of the chest, abdomen and pelvis was
performed following the standard protocol during bolus
administration of intravenous contrast.
CONTRAST:  100mL [ZN] IOPAMIDOL ([ZN]) INJECTION 61%

[Series 2: cap with · axial · 0.91mm/px · z∈[-778,-213]mm · 9 of 137 slices shown, 12 images]
[im 12/137  mediastinal]
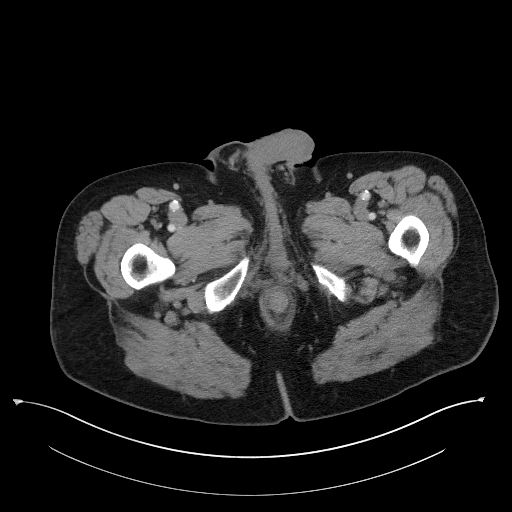
[im 12/137  lung]
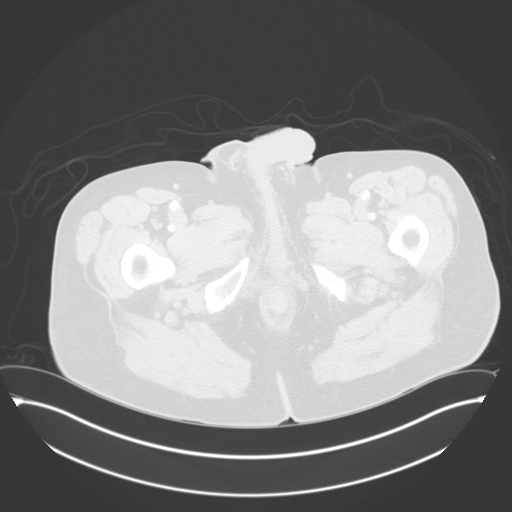
[im 23/137  lung]
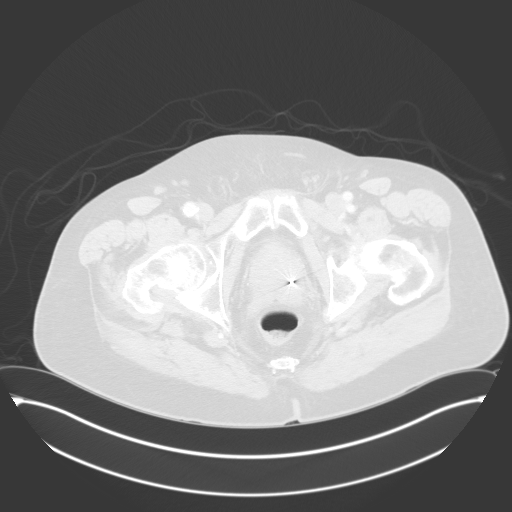
[im 46/137  lung]
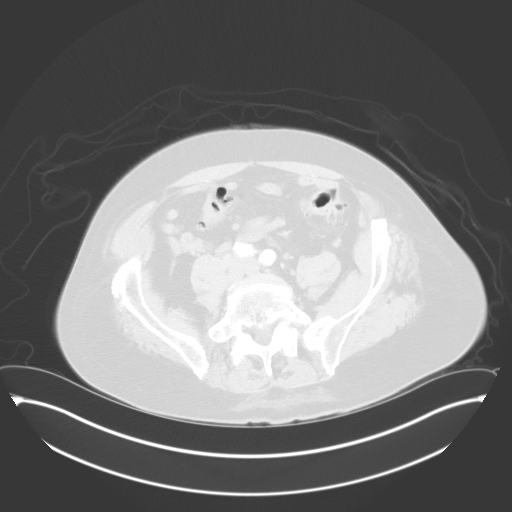
[im 57/137  lung]
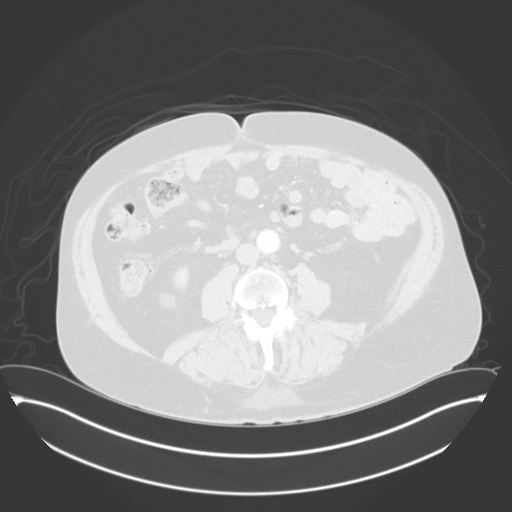
[im 69/137  mediastinal]
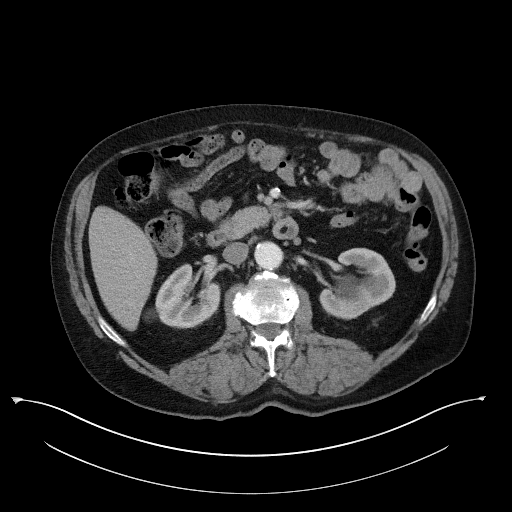
[im 69/137  lung]
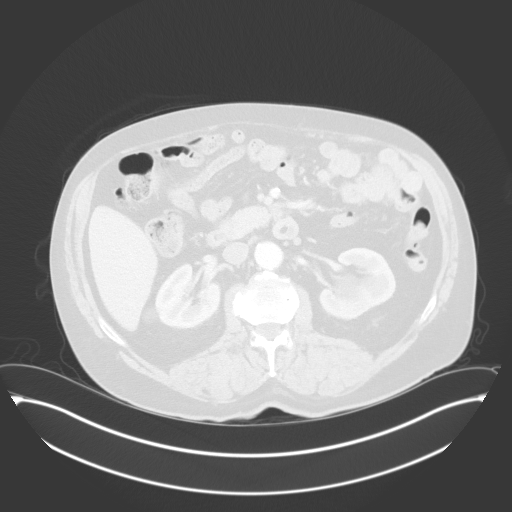
[im 80/137  lung]
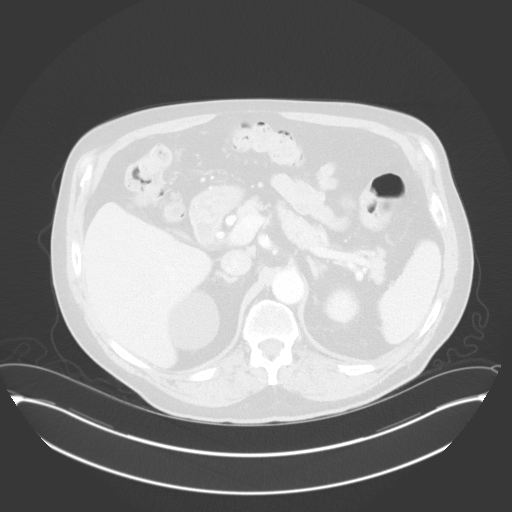
[im 91/137  lung]
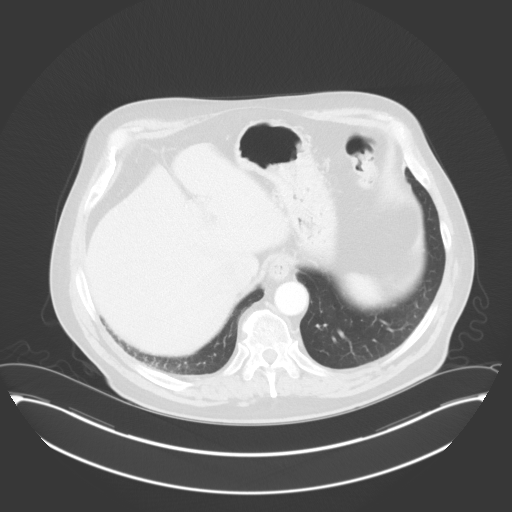
[im 114/137  lung]
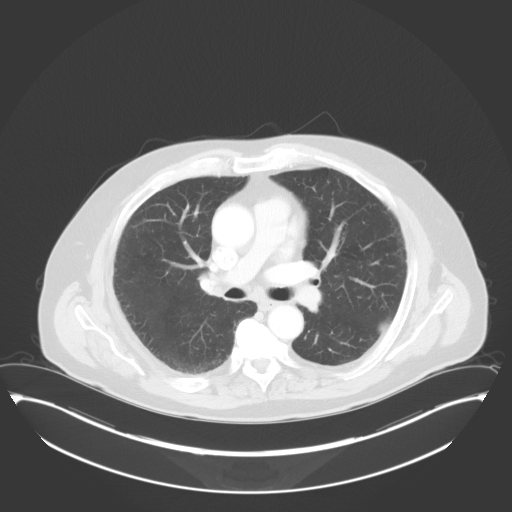
[im 125/137  mediastinal]
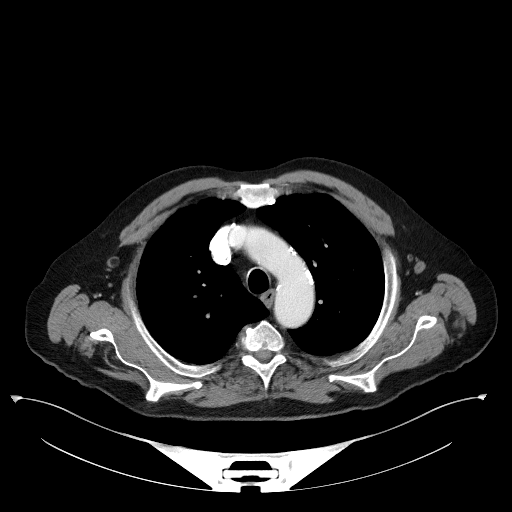
[im 125/137  lung]
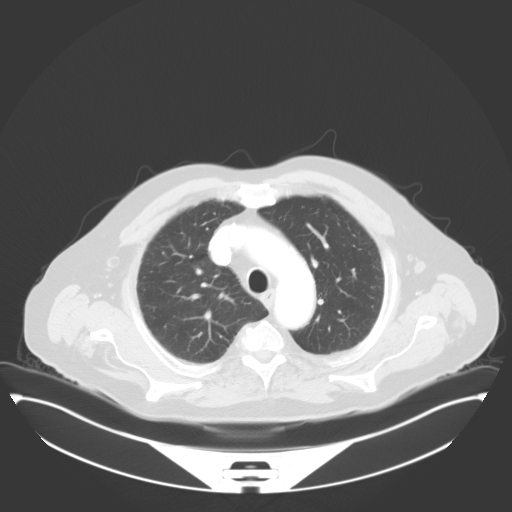

[Series 5: coronals · coronal · 0.82mm/px · 3 of 145 slices shown]
[im 29/145  lung]
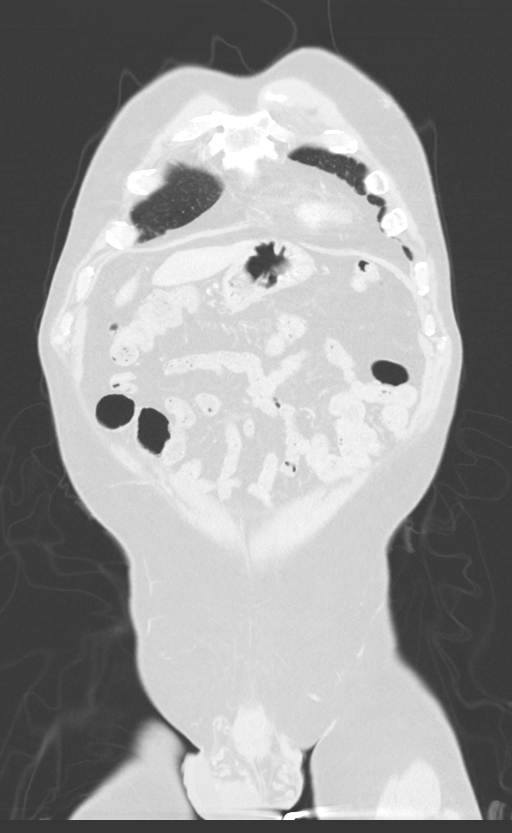
[im 58/145  lung]
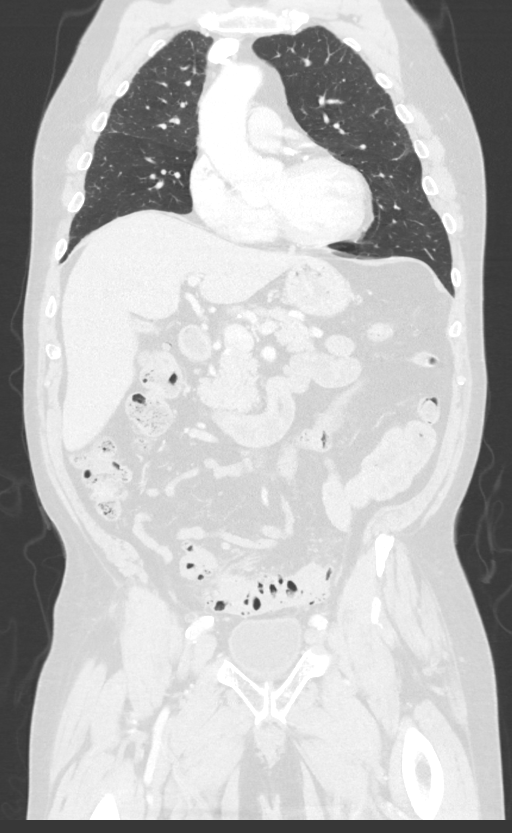
[im 87/145  lung]
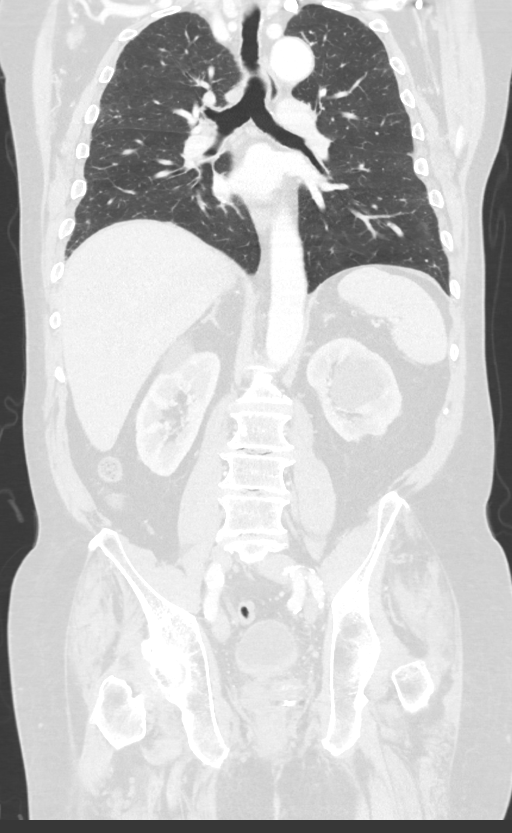

[12 of 36 positions shown; findings below may reference images not displayed]

FINDINGS: CT CHEST FINDINGS

Cardiovascular: Calcified coronary artery and mild thoracic aortic
atherosclerosis. Cardiac size is within normal limits. No
pericardial effusion. The thoracic aorta is intact. Other major
mediastinal vascular structures appear within normal limits.

Mediastinum/Nodes: Negative. No mediastinal hematoma or
lymphadenopathy.

Lungs/Pleura: Major airways are patent. Mildly lower lung volumes
compared to DAGIR. No pneumothorax. No pleural effusion. Chronic
right lateral costophrenic angle scarring is stable. Lateral
subpleural scarring in both mid lungs. Mildly increased dependent
lower lobe opacity in the right costophrenic angle.

Musculoskeletal: No right rib fracture identified. Sternum intact.
Thoracic vertebrae intact.

There are mildly displaced left rib fractures of the lateral left
seventh (series 4, image 92) and eighth (image 109) ribs. Possible
nondisplaced left lateral tenth rib fracture.

No superficial chest wall hematoma or soft tissue injury identified.

CT ABDOMEN PELVIS FINDINGS

Hepatobiliary: No abdominal free air or free fluid. Contracted
gallbladder. Negative liver.

Pancreas: Negative.

Spleen: Intact and negative spleen.

Adrenals/Urinary Tract: Negative adrenal glands. Multiple bilateral
simple renal cysts re- demonstrated. Posterior lower pole left renal
cortical scarring. Bilateral renal enhancement and contrast
excretion is within normal limits.

Unremarkable urinary bladder.

Stomach/Bowel: Negative rectum.

Severe sigmoid diverticulosis. Resolved surrounding mesenteric
inflammation and retroperitoneal stranding since [REDACTED]. The
sigmoid wall in this region still appears somewhat thickened and
featureless.

Mild diverticulosis and redundancy of the left colon without active
inflammation. Mild redundancy of the splenic flexure. Negative
transverse colon. Redundant right colon with moderate
diverticulosis. No active inflammation. The cecum is on a lax
mesentery. Negative appendix. Negative terminal ileum. No dilated or
abnormal small bowel. Mostly decompressed stomach and duodenum.
Small lipoma in the distal duodenum on series 2, image 69
incidentally noted and stable.

Vascular/Lymphatic: Extensive Aortoiliac calcified atherosclerosis
noted. Major arterial structures in the abdomen and pelvis are
patent. Grossly patent portal venous system.

No lymphadenopathy.

Reproductive: Sequelae of prostate brachy therapy. Small right
scrotal hydrocele.

Other: No pelvic free fluid.

No superficial abdominal wall soft tissue injury identified.

Musculoskeletal: Lumbar levels appear stable and intact. Increased
vacuum disc phenomena at L2-L3. Sacrum and pelvis appears stable and
intact. Proximal femurs appear stable and intact.
IMPRESSION: 1. Mildly displaced left lateral seventh and eighth rib fractures.
2. No other acute traumatic injury identified in the chest, abdomen,
or pelvis.
3. Resolved sigmoid diverticulitis since [REDACTED]. Severe
diverticulosis in the sigmoid and up to moderate large bowel
diverticulosis elsewhere.
4. Calcified aortic atherosclerosis. Calcified coronary artery
atherosclerosis.

## 2016-06-15 MED ORDER — OXYCODONE HCL 5 MG PO TABS: 5.0000 mg | ORAL_TABLET | Freq: Four times a day (QID) | ORAL | 0 refills | Status: DC | PRN

## 2016-06-15 MED ORDER — OXYCODONE HCL 5 MG PO TABS
ORAL_TABLET | ORAL | Status: AC
  Administered 2016-06-15: 5 mg via ORAL
  Filled 2016-06-15: qty 1

## 2016-06-15 MED ORDER — IOPAMIDOL (ISOVUE-300) INJECTION 61%
100.0000 mL | Freq: Once | INTRAVENOUS | Status: AC | PRN
  Administered 2016-06-15: 100 mL via INTRAVENOUS
  Filled 2016-06-15: qty 100

## 2016-06-15 MED ORDER — OXYCODONE HCL 5 MG PO TABS
5.0000 mg | ORAL_TABLET | Freq: Once | ORAL | Status: AC
  Administered 2016-06-15: 5 mg via ORAL

## 2016-06-15 NOTE — ED Notes (Signed)
Patient transported to CT 

## 2016-06-15 NOTE — ED Notes (Signed)
ED Provider at bedside. 

## 2016-06-15 NOTE — ED Provider Notes (Signed)
[facility] Emergency Department Provider Note  ____________________________________________  Time seen: Approximately 8:49 PM  I have reviewed the triage vital signs and the nursing notes.   HISTORY  Chief Complaint Fall    HPI Cameron Foster. is a 76 y.o. male who complains of left lateral chest wall pain after a fall today. He was trying to put a shower curtain out today, and when he tried to lift his leg up onto the edge of the tub, he lost his balance and fell down onto his left chest wall. He's been having trouble with the knee and is contemplating a knee replacement, so the difficulty he had lifting the leg today was unsurprising. He hit the back of his head as well but no loss of consciousness. Denies neck pain. No paresthesias. No shortness of breath. Also complains of upper upper quadrant abdominal pain. No vomiting or bloody stool.  Patient was able to get himself up and has been ambulatory since then.   Past Medical History:  Diagnosis Date  . Alcoholism ([place])   . Arrhythmia   . Arthritis   . Asthma   . Atrial fibrillation ([place])    one episode  . Colon polyp   . Diverticulitis   . Diverticulosis 30 years  . Fainting   . History of blood clots   . Hyperlipidemia   . Hypertension   . Irritable bowel syndrome   . Prostate cancer ([place]) 2012   treated with radiation therapy.  . Vertigo      Patient Active Problem List   Diagnosis Date Noted  . Preoperative clearance 05/16/2016  . Visit for wound check 05/16/2016  . Vasovagal syncope 01/27/2016  . Diverticulitis large intestine 01/18/2016  . Abdominal bloating 01/01/2016  . Rash and nonspecific skin eruption 10/28/2015  . Actinic keratoses 10/28/2015  . Osteoarthritis of right knee 10/01/2015  . Essential hypertension 08/25/2015  . Chronic abdominal pain 08/25/2015  . Anxiety 08/25/2015  . Chronic sinus infection 08/12/2015  . Exertional dyspnea 08/12/2015  . Right leg  numbness 08/12/2015  . Gross hematuria 08/04/2015  . Erectile dysfunction following radiation therapy 08/04/2015  . Urinary retention 08/03/2015  . Myalgia 08/02/2015  . Constipation 08/02/2015  . History of prostate cancer 01/20/2015  . Skin nodule 01/20/2015  . Grief reaction 01/20/2015  . Swelling of right lower extremity 01/20/2015     Past Surgical History:  Procedure Laterality Date  . CARDIAC CATHETERIZATION     [place] no stents  . COLONOSCOPY  2016  . COLONOSCOPY WITH PROPOFOL N/A 05/30/2016   Procedure: COLONOSCOPY WITH PROPOFOL;  Surgeon: Lollie Sails, MD;  [facility] ENDOSCOPY;  Service: Endoscopy;  Laterality: N/A;  . ESOPHAGOGASTRODUODENOSCOPY    . ESOPHAGOGASTRODUODENOSCOPY N/A 05/30/2016   Procedure: ESOPHAGOGASTRODUODENOSCOPY (EGD);  Surgeon: Lollie Sails, MD;  Location: [facility] ENDOSCOPY;  Service: Endoscopy;  Laterality: N/A;  . KNEE ARTHROSCOPY    . PROSTATE SURGERY     Microwave therapy     Prior to Admission medications   Medication Sig Start Date End Date Taking? Authorizing Provider  amLODipine (NORVASC) 5 MG tablet Take 1 tablet (5 mg total) by mouth daily. 02/10/16   Leone Haven, MD  atenolol (TENORMIN) 25 MG tablet Take 25 mg by mouth daily.    Historical Provider, MD  citalopram (CELEXA) 20 MG tablet Take 20 mg by mouth daily.    Historical Provider, MD  hydrocortisone 2.5 % cream Apply topically 2 (two) times daily as needed. For  rash 04/13/16   Leone Haven, MD  lisinopril (PRINIVIL,ZESTRIL) 10 MG tablet Take 1 tablet (10 mg total) by mouth daily. 02/10/16   Leone Haven, MD  meloxicam (MOBIC) 15 MG tablet Take 15 mg by mouth daily.    Historical Provider, MD  metoprolol succinate (TOPROL-XL) 25 MG 24 hr tablet Take 1 tablet (25 mg total) by mouth daily. 02/10/16   Leone Haven, MD  Multiple Vitamin (MULTIVITAMIN) tablet Take 1 tablet by mouth daily.    Historical Provider, MD  omeprazole (PRILOSEC) 40 MG capsule  TAKE ONE CAPSULE BY MOUTH ONCE DAILY 04/13/16   Leone Haven, MD  oxyCODONE (ROXICODONE) 5 MG immediate release tablet Take 1 tablet (5 mg total) by mouth every 6 (six) hours as needed for breakthrough pain. 06/15/16   Carrie Mew, MD  polyethylene glycol powder (GLYCOLAX/MIRALAX) powder Take 17 g by mouth 2 (two) times daily as needed. 08/03/15   Leone Haven, MD  sennosides-docusate sodium (SENOKOT-S) 8.6-50 MG tablet Take 2 tablets by mouth every morning.    Historical Provider, MD  sertraline (ZOLOFT) 50 MG tablet Take 1 tablet (50 mg total) by mouth daily. 02/10/16   Leone Haven, MD  triamcinolone cream (KENALOG) 0.1 % Apply 1 application topically 2 (two) times daily as needed. For itching 10/28/15   Leone Haven, MD     Allergies Formaldehyde   Family History  Problem Relation Age of Onset  . Lung cancer Father   . Bipolar disorder Son   . Sudden death Son     due to drug over dose and cardiac issues  . Prostate cancer Neg Hx   . Bladder Cancer Neg Hx     Social History Social History  Substance Use Topics  . Smoking status: Former Smoker    Years: 27.00    Quit date: 04/18/1988  . Smokeless tobacco: Current User    Types: Chew  . Alcohol use 0.6 oz/week    1 Standard drinks or equivalent per week    Review of Systems  Constitutional:   No fever or chills.  ENT:   No sore throat. No rhinorrhea. Cardiovascular:   Positive as above chest pain. Respiratory:   No dyspnea or cough. Gastrointestinal:   Positive left upper abdominal pain without, vomiting and diarrhea.  Genitourinary:   Negative for dysuria or difficulty urinating. Musculoskeletal:   Negative for focal pain or swelling Neurological:   Negative for headaches 10-point ROS otherwise negative.  ____________________________________________   PHYSICAL EXAM:  VITAL SIGNS: ED Triage Vitals  Enc Vitals Group     BP 06/15/16 1745 (!) 172/106     Pulse Rate 06/15/16 1745 86     Resp  06/15/16 1745 18     Temp 06/15/16 1745 97.6 F (36.4 C)     Temp Source 06/15/16 1745 Oral     SpO2 06/15/16 1745 98 %     Weight 06/15/16 1745 205 lb (93 kg)     Height 06/15/16 1745 5' 9"  (1.753 m)     Head Circumference --      Peak Flow --      Pain Score 06/15/16 1746 9     Pain Loc --      Pain Edu? --      Excl. in [place]? --     Vital signs reviewed, nursing assessments reviewed.   Constitutional:   Alert and oriented. Well appearing and in no distress. Eyes:   No scleral icterus. No  conjunctival pallor. PERRL. EOMI.  No nystagmus. ENT   Head:   Normocephalic and atraumatic.   Nose:   No congestion/rhinnorhea. No septal hematoma   Mouth/Throat:   MMM, no pharyngeal erythema. No peritonsillar mass.    Neck:   No stridor. No SubQ emphysema. No meningismus.Mild midline tenderness around C4, full range of motion. Hematological/Lymphatic/Immunilogical:   No cervical lymphadenopathy. Cardiovascular:   RRR. Symmetric bilateral radial and DP pulses.  No murmurs.  Respiratory:   Normal respiratory effort without tachypnea nor retractions. Breath sounds are clear and equal bilaterally. No wheezes/rales/rhonchi. Left inferolateral chest wall tenderness without flail or instability. Gastrointestinal:   Soft with left upper quadrant tenderness. Non distended. There is no CVA tenderness.  No rebound, rigidity, or guarding. No bruising Genitourinary:   deferred Musculoskeletal:   Normal range of motion in all extremities. No joint effusions.  No lower extremity tenderness.  No edema. Neurologic:   Normal speech and language.  CN 2-10 normal. Motor grossly intact. No gross focal neurologic deficits are appreciated.  Skin:    Skin is warm, dry and intact. No rash noted.  No petechiae, purpura, or bullae.  ____________________________________________    LABS (pertinent positives/negatives) (all labs ordered are listed, but only abnormal results are displayed) Labs Reviewed   COMPREHENSIVE METABOLIC PANEL - Abnormal; Notable for the following:       Result Value   Potassium 3.4 (*)    Glucose, Bld 116 (*)    Creatinine, Ser 1.25 (*)    GFR calc non Af Amer 55 (*)    All other components within normal limits  CBC WITH DIFFERENTIAL/PLATELET - Abnormal; Notable for the following:    RBC 4.39 (*)    Hemoglobin 12.6 (*)    HCT 37.8 (*)    Neutro Abs 7.2 (*)    All other components within normal limits  LIPASE, BLOOD   ____________________________________________   EKG    ____________________________________________    RADIOLOGY  Ct Head Wo Contrast  Result Date: 06/15/2016 CLINICAL DATA:  Fall onto bathtub with blunt trauma. Posterior head injury and midline cervical spine tenderness. EXAM: CT HEAD WITHOUT CONTRAST CT CERVICAL SPINE WITHOUT CONTRAST TECHNIQUE: Multidetector CT imaging of the head and cervical spine was performed following the standard protocol without intravenous contrast. Multiplanar CT image reconstructions of the cervical spine were also generated. COMPARISON:  None. FINDINGS: CT HEAD FINDINGS Brain: No evidence of acute infarction, hemorrhage, hydrocephalus, extra-axial collection or mass lesion/mass effect. Remote lacunar infarct in the right basal ganglia and bilateral cerebellum. Mild generalized atrophy and chronic small vessel ischemia. Vascular: Atherosclerosis of skullbase vasculature without hyperdense vessel or abnormal calcification. Skull: Normal. Negative for fracture or focal lesion. Sinuses/Orbits: Paranasal sinuses and mastoid air cells are clear. Presumed bilateral cataract resection. Other: No large scalp hematoma. CT CERVICAL SPINE FINDINGS Alignment: 2 mm anterolisthesis of C2 on C3 appears degenerative. Less than 2 mm anterolisthesis of C6 on C7 appears degenerative. No evidence of traumatic malalignment, jumped or perched facets. Skull base and vertebrae: No acute fracture or suspicious focal lesion. The dens is intact.  Soft tissues and spinal canal: No prevertebral fluid or swelling. No visible canal hematoma. Disc levels: Disc space narrowing and endplate spurring throughout, most prominent C3-C4 through C5-C6. Degenerative change at the C1-C2 articulation. Scattered facet arthropathy and neural foraminal narrowing. Upper chest: No evident acute abnormality. Chest CT performed concurrently. Other: Carotid vascular calcifications. IMPRESSION: 1. No acute intracranial abnormality. Atrophy with chronic small vessel ischemia and remote lacunar infarcts.  2. Multilevel degenerative change throughout the cervical spine without acute fracture or subluxation. Electronically Signed   By: Jeb Levering M.D.   On: 06/15/2016 19:50   Ct Chest W Contrast  Result Date: 06/15/2016 CLINICAL DATA:  76 year old male status post fall in shower against bathtub. Blunt trauma with right rib cage and abdominal pain. Initial encounter. EXAM: CT CHEST, ABDOMEN, AND PELVIS WITH CONTRAST TECHNIQUE: Multidetector CT imaging of the chest, abdomen and pelvis was performed following the standard protocol during bolus administration of intravenous contrast. CONTRAST:  179m ISOVUE-300 IOPAMIDOL (ISOVUE-300) INJECTION 61% COMPARISON:  CT Abdomen and Pelvis 01/18/2016 and earlier. FINDINGS: CT CHEST FINDINGS Cardiovascular: Calcified coronary artery and mild thoracic aortic atherosclerosis. Cardiac size is within normal limits. No pericardial effusion. The thoracic aorta is intact. Other major mediastinal vascular structures appear within normal limits. Mediastinum/Nodes: Negative. No mediastinal hematoma or lymphadenopathy. Lungs/Pleura: Major airways are patent. Mildly lower lung volumes compared to October. No pneumothorax. No pleural effusion. Chronic right lateral costophrenic angle scarring is stable. Lateral subpleural scarring in both mid lungs. Mildly increased dependent lower lobe opacity in the right costophrenic angle. Musculoskeletal: No right  rib fracture identified. Sternum intact. Thoracic vertebrae intact. There are mildly displaced left rib fractures of the lateral left seventh (series 4, image 92) and eighth (image 109) ribs. Possible nondisplaced left lateral tenth rib fracture. No superficial chest wall hematoma or soft tissue injury identified. CT ABDOMEN PELVIS FINDINGS Hepatobiliary: No abdominal free air or free fluid. Contracted gallbladder. Negative liver. Pancreas: Negative. Spleen: Intact and negative spleen. Adrenals/Urinary Tract: Negative adrenal glands. Multiple bilateral simple renal cysts re- demonstrated. Posterior lower pole left renal cortical scarring. Bilateral renal enhancement and contrast excretion is within normal limits. Unremarkable urinary bladder. Stomach/Bowel: Negative rectum. Severe sigmoid diverticulosis. Resolved surrounding mesenteric inflammation and retroperitoneal stranding since October. The sigmoid wall in this region still appears somewhat thickened and featureless. Mild diverticulosis and redundancy of the left colon without active inflammation. Mild redundancy of the splenic flexure. Negative transverse colon. Redundant right colon with moderate diverticulosis. No active inflammation. The cecum is on a lax mesentery. Negative appendix. Negative terminal ileum. No dilated or abnormal small bowel. Mostly decompressed stomach and duodenum. Small lipoma in the distal duodenum on series 2, image 69 incidentally noted and stable. Vascular/Lymphatic: Extensive Aortoiliac calcified atherosclerosis noted. Major arterial structures in the abdomen and pelvis are patent. Grossly patent portal venous system. No lymphadenopathy. Reproductive: Sequelae of prostate brachy therapy. Small right scrotal hydrocele. Other: No pelvic free fluid. No superficial abdominal wall soft tissue injury identified. Musculoskeletal: Lumbar levels appear stable and intact. Increased vacuum disc phenomena at L2-L3. Sacrum and pelvis appears  stable and intact. Proximal femurs appear stable and intact. IMPRESSION: 1. Mildly displaced left lateral seventh and eighth rib fractures. 2. No other acute traumatic injury identified in the chest, abdomen, or pelvis. 3. Resolved sigmoid diverticulitis since October. Severe diverticulosis in the sigmoid and up to moderate large bowel diverticulosis elsewhere. 4. Calcified aortic atherosclerosis. Calcified coronary artery atherosclerosis. Electronically Signed   By: HGenevie AnnM.D.   On: 06/15/2016 19:55   Ct Cervical Spine Wo Contrast  Result Date: 06/15/2016 CLINICAL DATA:  Fall onto bathtub with blunt trauma. Posterior head injury and midline cervical spine tenderness. EXAM: CT HEAD WITHOUT CONTRAST CT CERVICAL SPINE WITHOUT CONTRAST TECHNIQUE: Multidetector CT imaging of the head and cervical spine was performed following the standard protocol without intravenous contrast. Multiplanar CT image reconstructions of the cervical spine were also generated. COMPARISON:  None. FINDINGS: CT HEAD FINDINGS Brain: No evidence of acute infarction, hemorrhage, hydrocephalus, extra-axial collection or mass lesion/mass effect. Remote lacunar infarct in the right basal ganglia and bilateral cerebellum. Mild generalized atrophy and chronic small vessel ischemia. Vascular: Atherosclerosis of skullbase vasculature without hyperdense vessel or abnormal calcification. Skull: Normal. Negative for fracture or focal lesion. Sinuses/Orbits: Paranasal sinuses and mastoid air cells are clear. Presumed bilateral cataract resection. Other: No large scalp hematoma. CT CERVICAL SPINE FINDINGS Alignment: 2 mm anterolisthesis of C2 on C3 appears degenerative. Less than 2 mm anterolisthesis of C6 on C7 appears degenerative. No evidence of traumatic malalignment, jumped or perched facets. Skull base and vertebrae: No acute fracture or suspicious focal lesion. The dens is intact. Soft tissues and spinal canal: No prevertebral fluid or swelling.  No visible canal hematoma. Disc levels: Disc space narrowing and endplate spurring throughout, most prominent C3-C4 through C5-C6. Degenerative change at the C1-C2 articulation. Scattered facet arthropathy and neural foraminal narrowing. Upper chest: No evident acute abnormality. Chest CT performed concurrently. Other: Carotid vascular calcifications. IMPRESSION: 1. No acute intracranial abnormality. Atrophy with chronic small vessel ischemia and remote lacunar infarcts. 2. Multilevel degenerative change throughout the cervical spine without acute fracture or subluxation. Electronically Signed   By: Jeb Levering M.D.   On: 06/15/2016 19:50   Ct Abdomen Pelvis W Contrast  Result Date: 06/15/2016 CLINICAL DATA:  76 year old male status post fall in shower against bathtub. Blunt trauma with right rib cage and abdominal pain. Initial encounter. EXAM: CT CHEST, ABDOMEN, AND PELVIS WITH CONTRAST TECHNIQUE: Multidetector CT imaging of the chest, abdomen and pelvis was performed following the standard protocol during bolus administration of intravenous contrast. CONTRAST:  137m ISOVUE-300 IOPAMIDOL (ISOVUE-300) INJECTION 61% COMPARISON:  CT Abdomen and Pelvis 01/18/2016 and earlier. FINDINGS: CT CHEST FINDINGS Cardiovascular: Calcified coronary artery and mild thoracic aortic atherosclerosis. Cardiac size is within normal limits. No pericardial effusion. The thoracic aorta is intact. Other major mediastinal vascular structures appear within normal limits. Mediastinum/Nodes: Negative. No mediastinal hematoma or lymphadenopathy. Lungs/Pleura: Major airways are patent. Mildly lower lung volumes compared to October. No pneumothorax. No pleural effusion. Chronic right lateral costophrenic angle scarring is stable. Lateral subpleural scarring in both mid lungs. Mildly increased dependent lower lobe opacity in the right costophrenic angle. Musculoskeletal: No right rib fracture identified. Sternum intact. Thoracic  vertebrae intact. There are mildly displaced left rib fractures of the lateral left seventh (series 4, image 92) and eighth (image 109) ribs. Possible nondisplaced left lateral tenth rib fracture. No superficial chest wall hematoma or soft tissue injury identified. CT ABDOMEN PELVIS FINDINGS Hepatobiliary: No abdominal free air or free fluid. Contracted gallbladder. Negative liver. Pancreas: Negative. Spleen: Intact and negative spleen. Adrenals/Urinary Tract: Negative adrenal glands. Multiple bilateral simple renal cysts re- demonstrated. Posterior lower pole left renal cortical scarring. Bilateral renal enhancement and contrast excretion is within normal limits. Unremarkable urinary bladder. Stomach/Bowel: Negative rectum. Severe sigmoid diverticulosis. Resolved surrounding mesenteric inflammation and retroperitoneal stranding since October. The sigmoid wall in this region still appears somewhat thickened and featureless. Mild diverticulosis and redundancy of the left colon without active inflammation. Mild redundancy of the splenic flexure. Negative transverse colon. Redundant right colon with moderate diverticulosis. No active inflammation. The cecum is on a lax mesentery. Negative appendix. Negative terminal ileum. No dilated or abnormal small bowel. Mostly decompressed stomach and duodenum. Small lipoma in the distal duodenum on series 2, image 69 incidentally noted and stable. Vascular/Lymphatic: Extensive Aortoiliac calcified atherosclerosis noted. Major arterial structures in the  abdomen and pelvis are patent. Grossly patent portal venous system. No lymphadenopathy. Reproductive: Sequelae of prostate brachy therapy. Small right scrotal hydrocele. Other: No pelvic free fluid. No superficial abdominal wall soft tissue injury identified. Musculoskeletal: Lumbar levels appear stable and intact. Increased vacuum disc phenomena at L2-L3. Sacrum and pelvis appears stable and intact. Proximal femurs appear stable  and intact. IMPRESSION: 1. Mildly displaced left lateral seventh and eighth rib fractures. 2. No other acute traumatic injury identified in the chest, abdomen, or pelvis. 3. Resolved sigmoid diverticulitis since October. Severe diverticulosis in the sigmoid and up to moderate large bowel diverticulosis elsewhere. 4. Calcified aortic atherosclerosis. Calcified coronary artery atherosclerosis. Electronically Signed   By: Genevie Ann M.D.   On: 06/15/2016 19:55    ____________________________________________   PROCEDURES Procedures  ____________________________________________   INITIAL IMPRESSION / ASSESSMENT AND PLAN / ED COURSE  Pertinent labs & imaging results that were available during my care of the patient were reviewed by me and considered in my medical decision making (see chart for details).  Patient presents with blunt trauma to the left side at the lower ribs, concern for rib fracture, pneumothorax, splenic rupture. Hemodynamically stable at present time. Also has history of head injury and not on blood thinners, CT scans performed which show only a fracture of the left seventh and eighth through, not significantly displaced, no pulmonary injury or splenic injury or other concerns. Patient is neurologically intact, pain is reasonably well controlled. Incentive spirometer, counseled on pain control, ice tonight. Patient will follow up with primary care.         ____________________________________________   FINAL CLINICAL IMPRESSION(S) / ED DIAGNOSES  Final diagnoses:  Closed fracture of multiple ribs of left side, initial encounter      New Prescriptions   OXYCODONE (ROXICODONE) 5 MG IMMEDIATE RELEASE TABLET    Take 1 tablet (5 mg total) by mouth every 6 (six) hours as needed for breakthrough pain.     Portions of this note were generated with dragon dictation software. Dictation errors may occur despite best attempts at proofreading.    Carrie Mew, MD 06/15/16  2053

## 2016-06-15 NOTE — ED Triage Notes (Signed)
Pt reports he fell in the shower (the shower curtain fell and he was standing on the tub to put it back up and slipped) and hurt his right rib cage and abd - denies any other pains - denies loss of consciousness - report that he did not hit his head hard enough to hurt

## 2016-06-16 ENCOUNTER — Encounter: Payer: Self-pay | Admitting: Family Medicine

## 2016-06-16 ENCOUNTER — Telehealth: Payer: Self-pay | Admitting: *Deleted

## 2016-06-16 NOTE — Telephone Encounter (Signed)
Pt requested a call, he fell yesterday sustaining fractured ribs  . He has been scheduled for knee surgery on 07/06/16. He questioned If he should continue with the surgery with fractured ribs.  Pt contact [phone]

## 2016-06-16 NOTE — Telephone Encounter (Signed)
Patient notified and will contact ortho

## 2016-06-16 NOTE — Telephone Encounter (Signed)
Please advise 

## 2016-06-16 NOTE — Telephone Encounter (Signed)
I would suggest holding off on the surgery until these have healed to lessen risk of complications from surgery though I would advise discussing with his orthopedist to get their opinion as well.

## 2016-06-17 ENCOUNTER — Emergency Department
Admission: EM | Admit: 2016-06-17 | Discharge: 2016-06-17 | Disposition: A | Discharge status: Home / Self Care | Payer: Medicare Other | Attending: Emergency Medicine | Admitting: Emergency Medicine

## 2016-06-17 ENCOUNTER — Telehealth: Payer: Self-pay | Admitting: Family Medicine

## 2016-06-17 ENCOUNTER — Emergency Department: Payer: Medicare Other

## 2016-06-17 ENCOUNTER — Encounter: Payer: Self-pay | Admitting: Emergency Medicine

## 2016-06-17 DIAGNOSIS — Z79899 Other long term (current) drug therapy: Secondary | ICD-10-CM | POA: Diagnosis not present

## 2016-06-17 DIAGNOSIS — R1013 Epigastric pain: Secondary | ICD-10-CM | POA: Diagnosis not present

## 2016-06-17 DIAGNOSIS — F17220 Nicotine dependence, chewing tobacco, uncomplicated: Secondary | ICD-10-CM | POA: Diagnosis not present

## 2016-06-17 DIAGNOSIS — J45909 Unspecified asthma, uncomplicated: Secondary | ICD-10-CM | POA: Diagnosis not present

## 2016-06-17 DIAGNOSIS — I10 Essential (primary) hypertension: Secondary | ICD-10-CM | POA: Diagnosis not present

## 2016-06-17 DIAGNOSIS — R1010 Upper abdominal pain, unspecified: Secondary | ICD-10-CM | POA: Diagnosis present

## 2016-06-17 DIAGNOSIS — S2232XA Fracture of one rib, left side, initial encounter for closed fracture: Secondary | ICD-10-CM | POA: Diagnosis not present

## 2016-06-17 LAB — COMPREHENSIVE METABOLIC PANEL
ALBUMIN: 3.5 g/dL (ref 3.5–5.0)
ALK PHOS: 63 U/L (ref 38–126)
ALT: 15 U/L — AB (ref 17–63)
ANION GAP: 8 (ref 5–15)
AST: 36 U/L (ref 15–41)
BUN: 10 mg/dL (ref 6–20)
CO2: 23 mmol/L (ref 22–32)
CREATININE: 1.29 mg/dL — AB (ref 0.61–1.24)
Calcium: 8.7 mg/dL — ABNORMAL LOW (ref 8.9–10.3)
Chloride: 106 mmol/L (ref 101–111)
GFR calc Af Amer: >60 mL/min (ref >60)
GFR calc non Af Amer: 53 mL/min — ABNORMAL LOW (ref >60)
GLUCOSE: 123 mg/dL — AB (ref 65–99)
Potassium: 3.9 mmol/L (ref 3.5–5.1)
SODIUM: 137 mmol/L (ref 135–145)
Total Bilirubin: 1.2 mg/dL (ref 0.3–1.2)
Total Protein: 7 g/dL (ref 6.5–8.1)

## 2016-06-17 LAB — CBC WITH DIFFERENTIAL/PLATELET
BASOS PCT: 1 %
Basophils Absolute: 0.1 10*3/uL (ref 0–0.1)
EOS ABS: 0.1 10*3/uL (ref 0–0.7)
Eosinophils Relative: 1 %
HCT: 36.7 % — ABNORMAL LOW (ref 40.0–52.0)
HEMOGLOBIN: 12.1 g/dL — AB (ref 13.0–18.0)
Lymphocytes Relative: 6 %
Lymphs Abs: 0.5 10*3/uL — ABNORMAL LOW (ref 1.0–3.6)
MCH: 28.6 pg (ref 26.0–34.0)
MCHC: 33 g/dL (ref 32.0–36.0)
MCV: 86.6 fL (ref 80.0–100.0)
Monocytes Absolute: 0.7 10*3/uL (ref 0.2–1.0)
Monocytes Relative: 8 %
Neutro Abs: 7.4 10*3/uL — ABNORMAL HIGH (ref 1.4–6.5)
Neutrophils Relative %: 84 %
Platelets: 243 10*3/uL (ref 150–440)
RBC: 4.23 MIL/uL — AB (ref 4.40–5.90)
RDW: 13.7 % (ref 11.5–14.5)
WBC: 8.9 10*3/uL (ref 3.8–10.6)

## 2016-06-17 LAB — TROPONIN I: Troponin I: <0.03 ng/mL (ref <0.03)

## 2016-06-17 LAB — LIPASE, BLOOD: Lipase: 27 U/L (ref 11–51)

## 2016-06-17 IMAGING — CR DG CHEST 2V
2 series · 2 of 2 positions shown · non-contrast
Comparison: Chest CT [DATE]

CLINICAL DATA: Pain following fall

EXAM:
CHEST  2 VIEW

[chest pa]
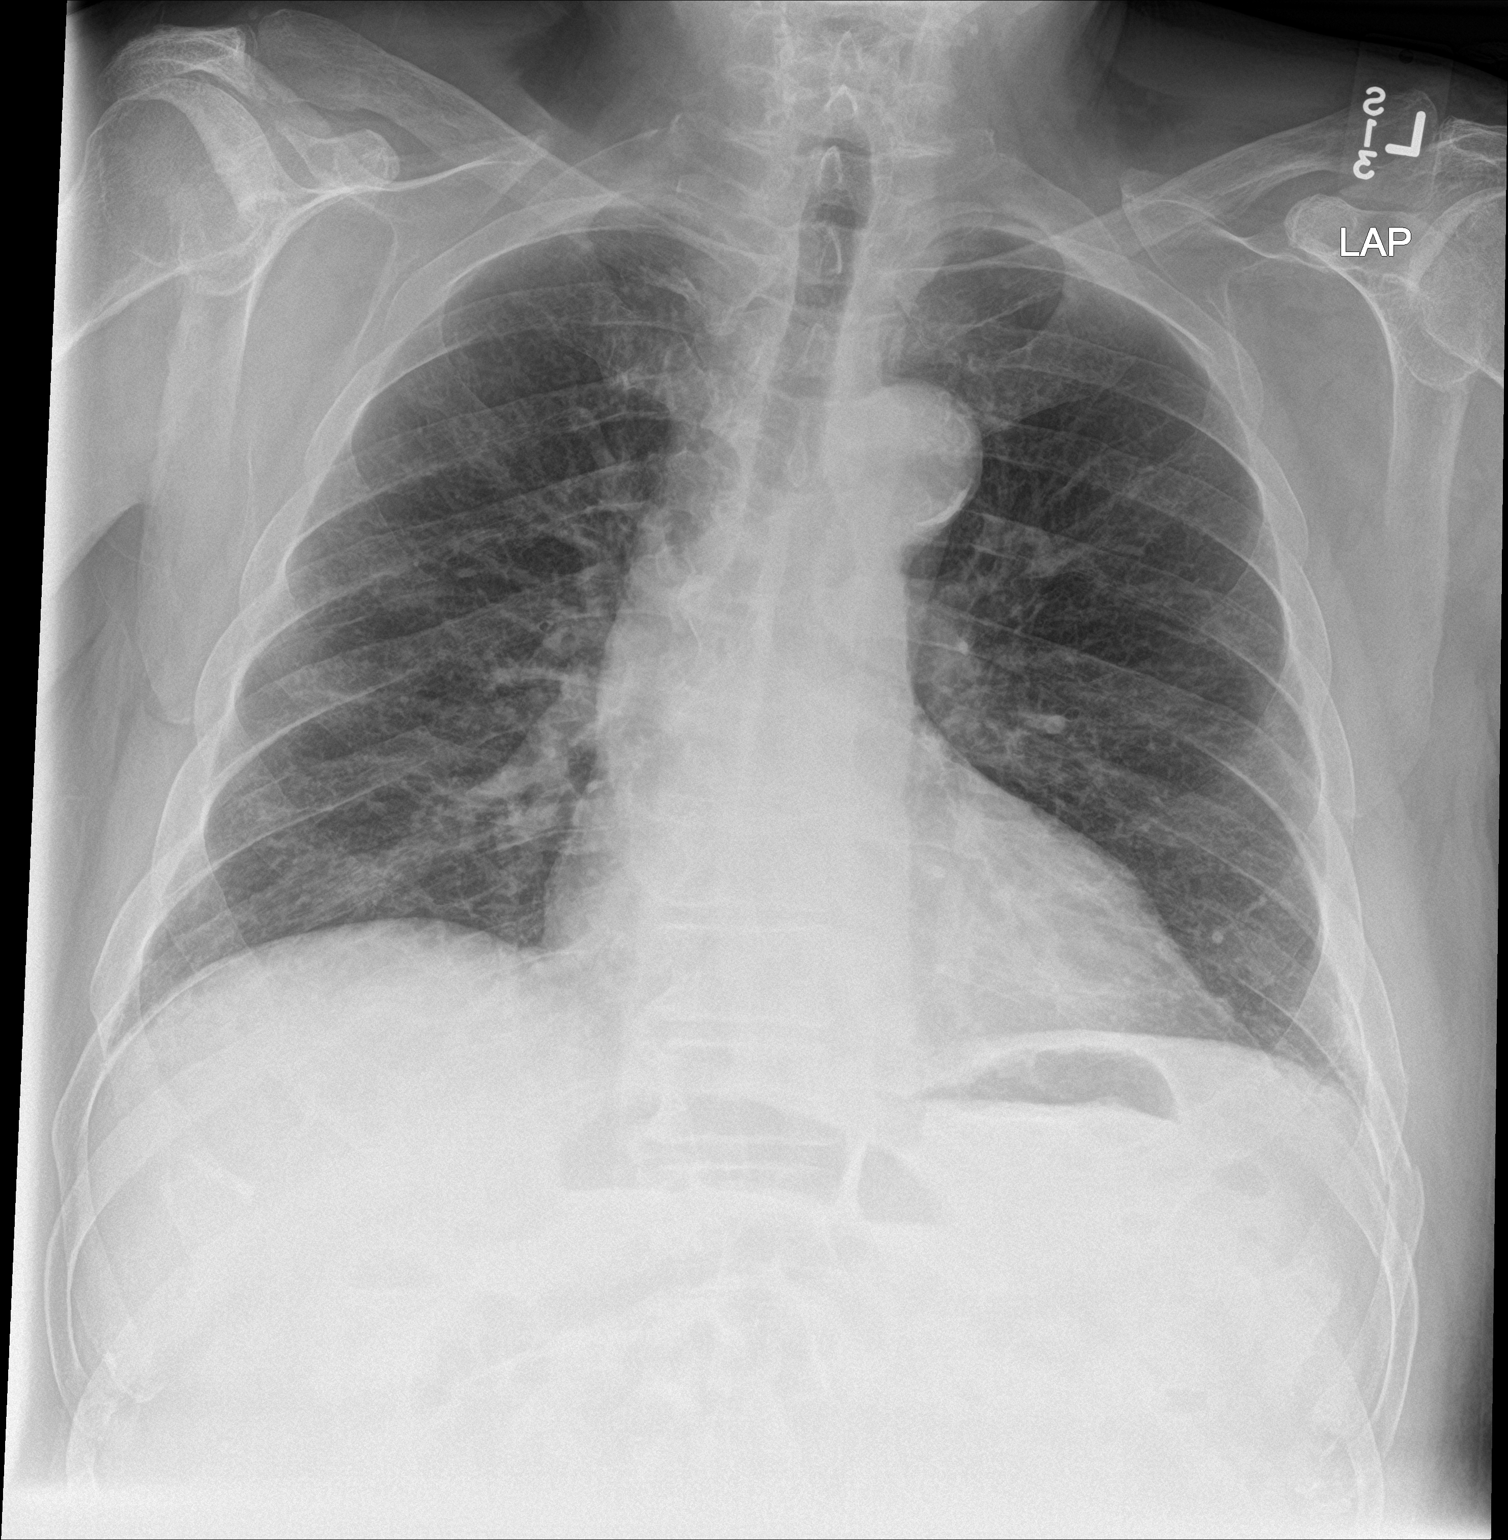

[chest lat]
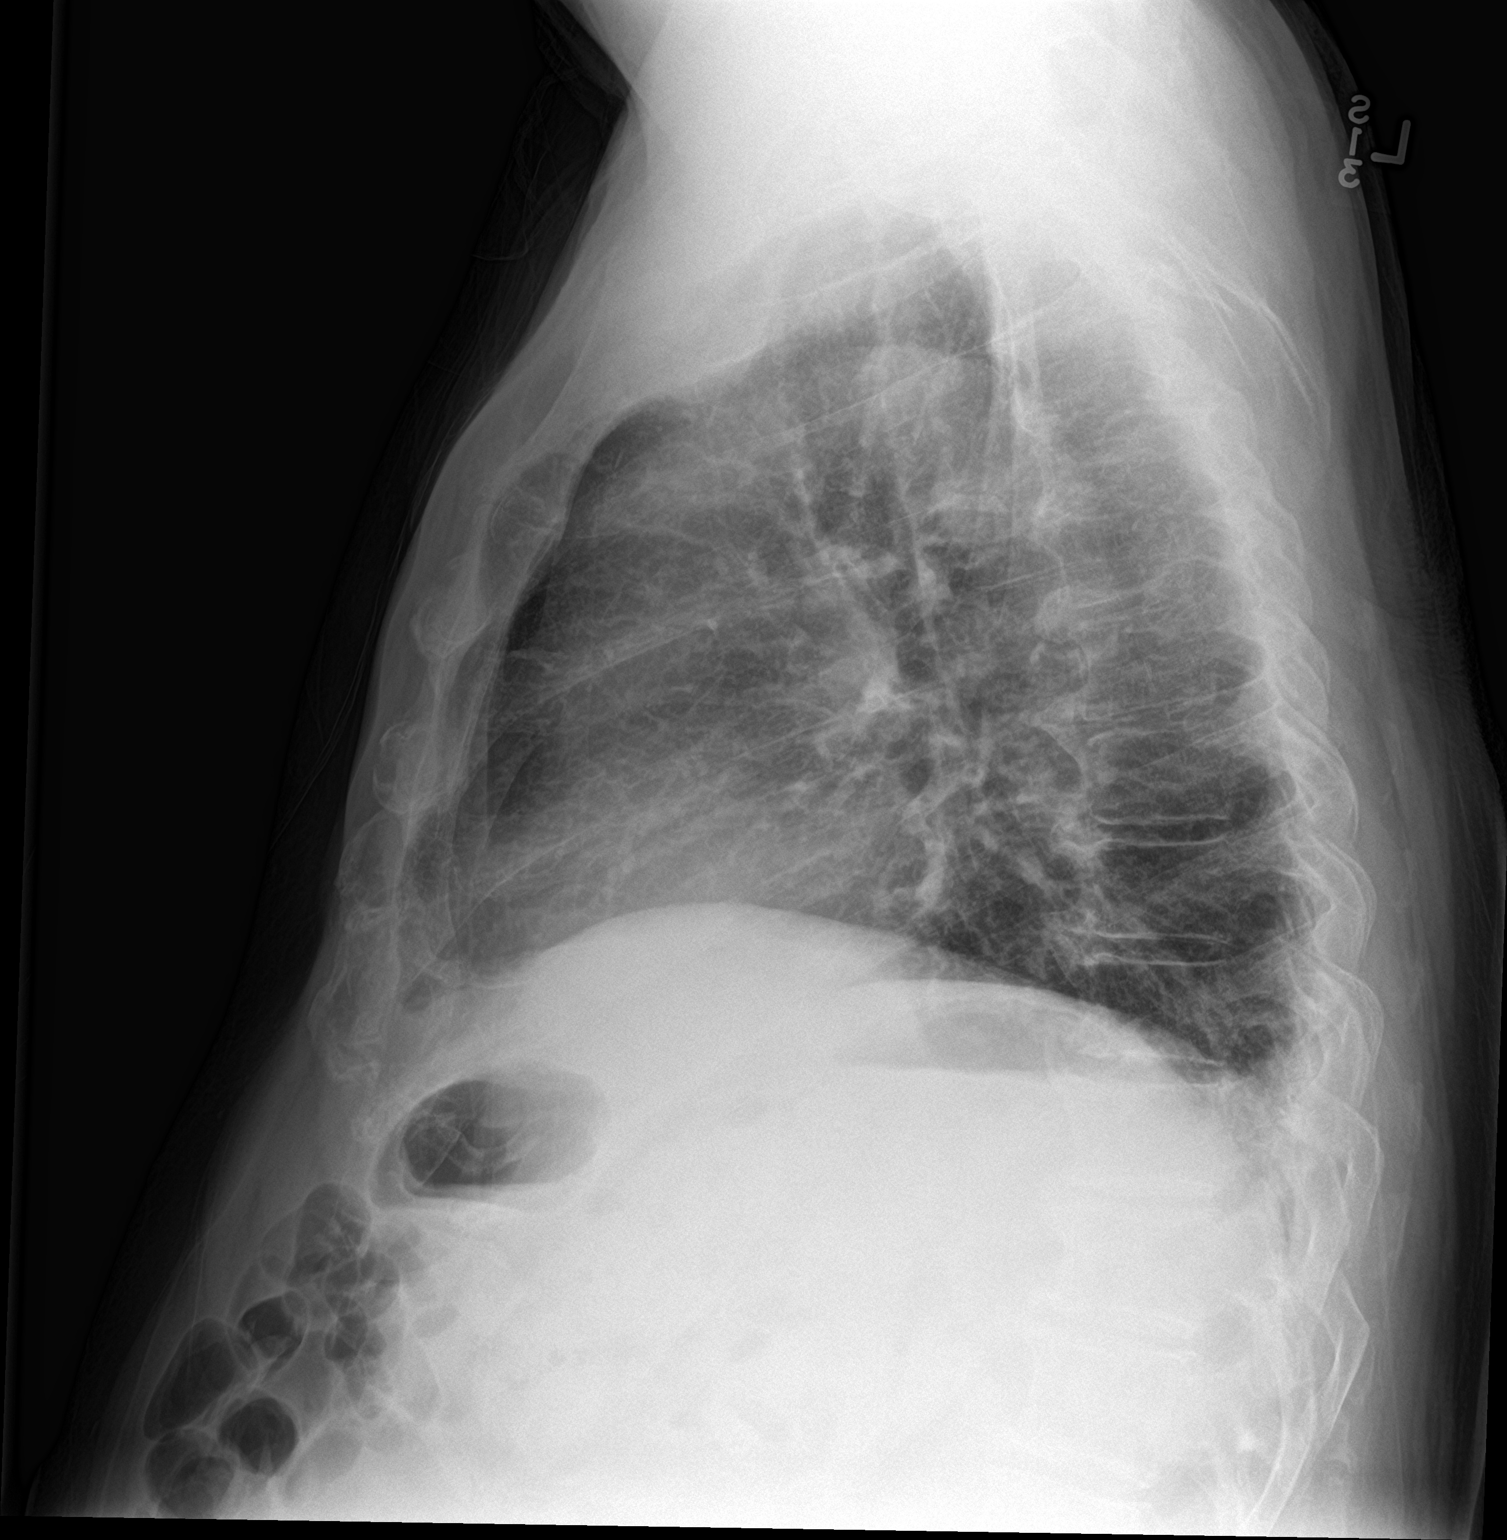

[2 of 2 positions shown; findings below may reference images not displayed]

FINDINGS: There are mildly displaced fractures of the anterior left eighth and
ninth ribs. There is a questionable nondisplaced fracture of the
anterior left seventh rib. No pneumothorax. No edema or
consolidation. Heart size and pulmonary vascularity are normal. No
adenopathy. There is atherosclerotic calcification in the aorta.
There is mild degenerative change in the thoracic spine.
IMPRESSION: Acute appearing rib fractures on the left. No pneumothorax. No edema
or consolidation. There is aortic atherosclerosis.

## 2016-06-17 MED ORDER — DICLOFENAC SODIUM 3 % TD GEL: 1.0000 "application " | Freq: Two times a day (BID) | TRANSDERMAL | 0 refills | Status: DC | PRN

## 2016-06-17 MED ORDER — MORPHINE SULFATE (PF) 4 MG/ML IV SOLN
4.0000 mg | Freq: Once | INTRAVENOUS | Status: AC
  Administered 2016-06-17: 4 mg via INTRAVENOUS
  Filled 2016-06-17: qty 1

## 2016-06-17 MED ORDER — CYCLOBENZAPRINE HCL 10 MG PO TABS
5.0000 mg | ORAL_TABLET | Freq: Once | ORAL | Status: DC
  Filled 2016-06-17: qty 1

## 2016-06-17 MED ORDER — DIAZEPAM 2 MG PO TABS
2.0000 mg | ORAL_TABLET | Freq: Once | ORAL | Status: AC
  Administered 2016-06-17: 2 mg via ORAL

## 2016-06-17 MED ORDER — DIAZEPAM 2 MG PO TABS
ORAL_TABLET | ORAL | Status: AC
  Administered 2016-06-17: 2 mg via ORAL
  Filled 2016-06-17: qty 1

## 2016-06-17 MED ORDER — OXYCODONE HCL 5 MG PO TABS: 5.0000 mg | ORAL_TABLET | Freq: Three times a day (TID) | ORAL | 0 refills | Status: DC | PRN

## 2016-06-17 NOTE — Telephone Encounter (Signed)
FYI

## 2016-06-17 NOTE — Telephone Encounter (Signed)
Please advise 

## 2016-06-17 NOTE — Telephone Encounter (Signed)
Tried to call patient received busy signal unable to leave message will continue to monitor. Patient returned cal and his rib pain is rated at a 10 on 0-10 scale.  The pain is much worse , than the past couple of days. Rated at 10 patient screaming on phone when spasm hits advised pateint to go to ED now to be examined to make sure he has not re-injured ribs , due to spasm started after twisting in bed and have not stopped for the last hour. Patient admitted to [facility] with pain. Patient pausing to catch breath on phone. Patient going to ED now.FYI

## 2016-06-17 NOTE — ED Provider Notes (Signed)
[facility] Emergency Department Provider Note  ____________________________________________   First MD Initiated Contact with Patient 06/17/16 [phone]     (approximate)  I have reviewed the triage vital signs and the nursing notes.   HISTORY  Chief Complaint Abdominal Pain   HPI Cameron Foster. is a 76 y.o. male with a recent fall and rib fractures who is presenting to the emergency department today with upper abdominal pain.  Patient says that in the upper abdominal pain is intermittent and has been worsening ever since his injury on February 28. He denies any nausea vomiting or diarrhea. Says that the pain is severe and cramping and not associated with movement. He says that he has not out of oxycodone at home which was helping to some degree. He denies any pain at this time and says the pain comes on without warning.    Past Medical History:  Diagnosis Date  . Alcoholism ([place])   . Arrhythmia   . Arthritis   . Asthma   . Atrial fibrillation ([place])    one episode  . Colon polyp   . Diverticulitis   . Diverticulosis 30 years  . Fainting   . History of blood clots   . Hyperlipidemia   . Hypertension   . Irritable bowel syndrome   . Prostate cancer ([place]) 2012   treated with radiation therapy.  . Vertigo     Patient Active Problem List   Diagnosis Date Noted  . Preoperative clearance 05/16/2016  . Visit for wound check 05/16/2016  . Vasovagal syncope 01/27/2016  . Diverticulitis large intestine 01/18/2016  . Abdominal bloating 01/01/2016  . Rash and nonspecific skin eruption 10/28/2015  . Actinic keratoses 10/28/2015  . Osteoarthritis of right knee 10/01/2015  . Essential hypertension 08/25/2015  . Chronic abdominal pain 08/25/2015  . Anxiety 08/25/2015  . Chronic sinus infection 08/12/2015  . Exertional dyspnea 08/12/2015  . Right leg numbness 08/12/2015  . Gross hematuria 08/04/2015  . Erectile dysfunction following radiation therapy  08/04/2015  . Urinary retention 08/03/2015  . Myalgia 08/02/2015  . Constipation 08/02/2015  . History of prostate cancer 01/20/2015  . Skin nodule 01/20/2015  . Grief reaction 01/20/2015  . Swelling of right lower extremity 01/20/2015    Past Surgical History:  Procedure Laterality Date  . CARDIAC CATHETERIZATION     [place] no stents  . COLONOSCOPY  2016  . COLONOSCOPY WITH PROPOFOL N/A 05/30/2016   Procedure: COLONOSCOPY WITH PROPOFOL;  Surgeon: Lollie Sails, MD;  Location: [facility] ENDOSCOPY;  Service: Endoscopy;  Laterality: N/A;  . ESOPHAGOGASTRODUODENOSCOPY    . ESOPHAGOGASTRODUODENOSCOPY N/A 05/30/2016   Procedure: ESOPHAGOGASTRODUODENOSCOPY (EGD);  Surgeon: Lollie Sails, MD;  Location[facility] ENDOSCOPY;  Service: Endoscopy;  Laterality: N/A;  . KNEE ARTHROSCOPY    . PROSTATE SURGERY     Microwave therapy    Prior to Admission medications   Medication Sig Start Date End Date Taking? Authorizing Provider  amLODipine (NORVASC) 5 MG tablet Take 1 tablet (5 mg total) by mouth daily. 02/10/16  Yes Leone Haven, MD  aspirin 325 MG tablet Take 325 mg by mouth daily.   Yes Historical Provider, MD  Aspirin-Acetaminophen (GOODY BODY PAIN) 500-325 MG PACK Take 1 packet by mouth as needed.   Yes Historical Provider, MD  hydrocortisone 2.5 % cream Apply topically 2 (two) times daily as needed. For rash 04/13/16  Yes Leone Haven, MD  lisinopril (PRINIVIL,ZESTRIL) 10 MG tablet Take 1 tablet (10 mg total)  by mouth daily. 02/10/16  Yes Leone Haven, MD  metoprolol succinate (TOPROL-XL) 25 MG 24 hr tablet Take 1 tablet (25 mg total) by mouth daily. 02/10/16  Yes Leone Haven, MD  Multiple Vitamin (MULTIVITAMIN) tablet Take 1 tablet by mouth daily.   Yes Historical Provider, MD  pantoprazole (PROTONIX) 40 MG tablet Take 40 mg by mouth 2 (two) times daily.   Yes Historical Provider, MD  polyethylene glycol powder (GLYCOLAX/MIRALAX) powder Take 17 g by mouth 2 (two)  times daily as needed. 08/03/15  Yes Leone Haven, MD  sennosides-docusate sodium (SENOKOT-S) 8.6-50 MG tablet Take 1 tablet by mouth daily.    Yes Historical Provider, MD  sertraline (ZOLOFT) 50 MG tablet Take 1 tablet (50 mg total) by mouth daily. 02/10/16  Yes Leone Haven, MD  atenolol (TENORMIN) 25 MG tablet Take 25 mg by mouth daily.    Historical Provider, MD  citalopram (CELEXA) 20 MG tablet Take 20 mg by mouth daily.    Historical Provider, MD  meloxicam (MOBIC) 15 MG tablet Take 15 mg by mouth daily.    Historical Provider, MD  omeprazole (PRILOSEC) 40 MG capsule TAKE ONE CAPSULE BY MOUTH ONCE DAILY Patient not taking: Reported on 06/17/2016 04/13/16   Leone Haven, MD  oxyCODONE (ROXICODONE) 5 MG immediate release tablet Take 1 tablet (5 mg total) by mouth every 6 (six) hours as needed for breakthrough pain. Patient not taking: Reported on 06/17/2016 06/15/16   Carrie Mew, MD  triamcinolone cream (KENALOG) 0.1 % Apply 1 application topically 2 (two) times daily as needed. For itching Patient not taking: Reported on 06/17/2016 10/28/15   Leone Haven, MD    Allergies Formaldehyde  Family History  Problem Relation Age of Onset  . Lung cancer Father   . Bipolar disorder Son   . Sudden death Son     due to drug over dose and cardiac issues  . Prostate cancer Neg Hx   . Bladder Cancer Neg Hx     Social History Social History  Substance Use Topics  . Smoking status: Former Smoker    Years: 27.00    Quit date: 04/18/1988  . Smokeless tobacco: Current User    Types: Chew  . Alcohol use 0.6 oz/week    1 Standard drinks or equivalent per week    Review of Systems Constitutional: No fever/chills Eyes: No visual changes. ENT: No sore throat. Cardiovascular: Denies chest pain. Respiratory: Denies shortness of breath. Gastrointestinal:  No nausea, no vomiting.  No diarrhea.  No constipation. Genitourinary: Negative for dysuria. Musculoskeletal: Negative for  back pain. Skin: Negative for rash. Neurological: Negative for headaches, focal weakness or numbness.  10-point ROS otherwise negative.  ____________________________________________   PHYSICAL EXAM:  VITAL SIGNS: ED Triage Vitals  Enc Vitals Group     BP 06/17/16 0923 127/70     Pulse Rate 06/17/16 0923 80     Resp 06/17/16 0923 18     Temp 06/17/16 0923 98.8 F (37.1 C)     Temp Source 06/17/16 0923 Oral     SpO2 06/17/16 0923 97 %     Weight --      Height --      Head Circumference --      Peak Flow --      Pain Score 06/17/16 0920 10     Pain Loc --      Pain Edu? --      Excl. in [place]? --  Constitutional: Alert and oriented. Well appearing and in no acute distress.  Her, the patient yells intermittently with pain. However these episodes only last several seconds and then completely resolved. Eyes: Conjunctivae are normal. PERRL. EOMI. Head: Atraumatic. Nose: No congestion/rhinnorhea. Mouth/Throat: Mucous membranes are moist.   Neck: No stridor.   Cardiovascular: Normal rate, regular rhythm. Grossly normal heart sounds.   Respiratory: Normal respiratory effort.  No retractions. Lungs CTAB. Gastrointestinal: Soft and nontender. No distention.  Musculoskeletal: No lower extremity tenderness nor edema.  No joint effusions. Neurologic:  Normal speech and language. No gross focal neurologic deficits are appreciated.  Skin:  Skin is warm, dry and intact. No rash noted. Psychiatric: Mood and affect are normal. Speech and behavior are normal.  ____________________________________________   LABS (all labs ordered are listed, but only abnormal results are displayed)  Labs Reviewed  CBC WITH DIFFERENTIAL/PLATELET - Abnormal; Notable for the following:       Result Value   RBC 4.23 (*)    Hemoglobin 12.1 (*)    HCT 36.7 (*)    Neutro Abs 7.4 (*)    Lymphs Abs 0.5 (*)    All other components within normal limits  COMPREHENSIVE METABOLIC PANEL - Abnormal; Notable for  the following:    Glucose, Bld 123 (*)    Creatinine, Ser 1.29 (*)    Calcium 8.7 (*)    ALT 15 (*)    GFR calc non Af Amer 53 (*)    All other components within normal limits  TROPONIN I  LIPASE, BLOOD   ____________________________________________  EKG  ED ECG REPORT I, Doran Stabler, the attending physician, personally viewed and interpreted this ECG.   Date: 06/17/2016  EKG Time: 1135  Rate: 69  Rhythm: normal sinus rhythm  Axis: Normal  Intervals:none  ST&T Change: T-wave inversion in  V5 and V6. Biphasic T waves in II, III, and F aVF. No ST elevation or depression. No significant changes from previous EKGs on the record.  ____________________________________________  RADIOLOGY  DG Chest 2 View (Accession [id number]) (Order [id number])  Imaging  Date: 06/17/2016 Department: [facility] EMERGENCY DEPARTMENT Released By/Authorizing: Orbie Pyo, MD (auto-released)  Exam Information   Status Exam Begun  Exam Ended   Final [99] 06/17/2016 10:24 AM 06/17/2016 10:37 AM  PACS Images   Show images for DG Chest 2 View  Study Result   CLINICAL DATA:  Pain following fall  EXAM: CHEST  2 VIEW  COMPARISON:  Chest CT June 15, 2016  FINDINGS: There are mildly displaced fractures of the anterior left eighth and ninth ribs. There is a questionable nondisplaced fracture of the anterior left seventh rib. No pneumothorax. No edema or consolidation. Heart size and pulmonary vascularity are normal. No adenopathy. There is atherosclerotic calcification in the aorta. There is mild degenerative change in the thoracic spine.  IMPRESSION: Acute appearing rib fractures on the left. No pneumothorax. No edema or consolidation. There is aortic atherosclerosis.   Electronically Signed   By: Lowella Grip III M.D.   On: 06/17/2016 10:42    ____________________________________________   PROCEDURES  Procedure(s) performed:    Procedures  Critical Care performed:   ____________________________________________   INITIAL IMPRESSION / ASSESSMENT AND PLAN / ED COURSE  Pertinent labs & imaging results that were available during my care of the patient were reviewed by me and considered in my medical decision making (see chart for details).  ----------------------------------------- 1:31 PM on 06/17/2016 -----------------------------------------  Patient feels improved  after morphine as well as Valium. Still with intermittent spasm. Likely secondary to his injury from several days ago. Labs reassuring. There is a problem with 3 agent of the lipase and the lab. Unclear when the lipase will results. However, the patient's pain appears to be more musculoskeletal. He says that it actually improves when he puts his hand over and applied gentle pressure. Denies any nausea vomiting or diarrhea. Less likely to be pancreatitis. He will be given several more days of Roxicet and then also Vytorin gel. He is understanding of this plan and willing to comply. Will be discharged home.      ____________________________________________   FINAL CLINICAL IMPRESSION(S) / ED DIAGNOSES  Abdominal pain.    NEW MEDICATIONS STARTED DURING THIS VISIT:  New Prescriptions   No medications on file     Note:  This document was prepared using Dragon voice recognition software and may include unintentional dictation errors.    Orbie Pyo, MD 06/17/16 1332

## 2016-06-17 NOTE — ED Triage Notes (Signed)
Pt to ed with c/o abd pain that started this am.  Pt denies n/v/d.  Denies fever.

## 2016-06-17 NOTE — Telephone Encounter (Signed)
[facility] Patient Name: Cameron Foster DOB: [birth year] Initial Comment Caller states fell Wed and cracked ribs, having spasms in abd area, screaming in pain Nurse Assessment Nurse: Markus Daft, RN, Sherre Poot Date/Time (Eastern Time): 06/17/2016 8:39:23 AM Confirm and document reason for call. If symptomatic, describe symptoms. ---Caller states fell Wed and cracked ribs, having spasms in abdominal area, screaming in pain. Makes it hard to talk or breathe. Spasms started this AM. Took oxycodone 5 mg. Does the patient have any new or worsening symptoms? ---Yes Will a triage be completed? ---Yes Related visit to physician within the last 2 weeks? ---Yes Does the PT have any chronic conditions? (i.e. diabetes, asthma, etc.) ---No Is this a behavioral health or substance abuse call? ---No Guidelines Guideline Title Affirmed Question Affirmed Notes Abdominal Injury SEVERE abdominal pain Final Disposition User Call EMS 911 Now Markus Daft, [place], Sherre Poot Comments Caller plans to have his wife take him to ED instead if calling 911. Disagree/Comply: Disagree Disagree/Comply Reason: Disagree with instructions

## 2016-06-17 NOTE — Telephone Encounter (Signed)
Noted and agree with ED eval.

## 2016-06-17 NOTE — Telephone Encounter (Signed)
Pt called stating he fell on Wednesday and broke a rib he actually crack several. Pt is in really bad pain in the middle of his body. I offered an appt for pt for today at 10:30am. Pt wants to know what he should do. I [facility] to [facility]. Please advise?  Call pt @ [phone]. Thank you!

## 2016-06-17 NOTE — ED Notes (Signed)
IV in R AC removed by this RN. Site clean, dry, and intact. Therapy complete. All catheter removed

## 2016-06-20 ENCOUNTER — Telehealth: Payer: Self-pay | Admitting: Family Medicine

## 2016-06-20 NOTE — Telephone Encounter (Signed)
I can't seem to find where this patient was given a Prolia injection in our records, please check that this is the correct patient? thanks

## 2016-06-20 NOTE — Telephone Encounter (Signed)
Dr. Caryl Bis,  Do you know anything about this patient having received Prolia? I don't have him on my lists and I am not sure what to assist with ?  Thanks Lavella Lemons

## 2016-06-20 NOTE — Telephone Encounter (Signed)
Pt called back about a bill he received for the Prolia injection which was not covered by his insurance. Pt states he was told it would be taken care of. Please advise with pt? Thankyou! Prolia injection was given on 08/03/2015.  Call pt @ [phone].

## 2016-06-20 NOTE — Telephone Encounter (Signed)
Yes this pt called about a bill he received for his Prolia injection. I advised pt I did not see anything either. Thank you!

## 2016-06-20 NOTE — Telephone Encounter (Signed)
I do not have him with a history of osteoporosis. Doesn't look like he's had a bone density scan. I do not believe I have ever ordered Prolia for this patient either.

## 2016-06-21 ENCOUNTER — Encounter: Payer: Self-pay | Admitting: Family Medicine

## 2016-06-21 NOTE — Telephone Encounter (Signed)
I did some research I believe he is referring to the methoprednsione shot he got in July at a [facility].  His is going to need to contact billing for this.  I am not sure who that will be, sorry

## 2016-06-21 NOTE — Telephone Encounter (Signed)
I called pt back and gave pt the information with the number to call billing. :)

## 2016-06-21 NOTE — Telephone Encounter (Signed)
[facility] pt account called about pt bill. Pt states to them it was 07/30/2015 not 08/03/2015. Please advise? Pt received B12 and hemoglobin that's what he's getting billed for. Thank you!

## 2016-06-21 NOTE — Telephone Encounter (Signed)
They were ordered for neuropathy. Typically Lorriane Shire is the one that assists with billing and coding issues. I will forward to her.

## 2016-06-21 NOTE — Telephone Encounter (Signed)
After further research this patient is calling concerning a b12 and HA1C labwork.  He is not happy that he has a bill for it.  I am guessing that this has something to do with how it was coded? I am not sure what I can do to assist? Please advise, thanks

## 2016-06-22 ENCOUNTER — Encounter
Admission: RE | Admit: 2016-06-22 | Discharge: 2016-06-22 | Disposition: A | Discharge status: Home / Self Care | Payer: Medicare Other | Source: Ambulatory Visit | Attending: Specialist | Admitting: Specialist

## 2016-06-22 ENCOUNTER — Encounter: Payer: Self-pay | Admitting: Family Medicine

## 2016-06-22 ENCOUNTER — Telehealth: Payer: Self-pay

## 2016-06-22 DIAGNOSIS — Z01812 Encounter for preprocedural laboratory examination: Secondary | ICD-10-CM | POA: Diagnosis not present

## 2016-06-22 HISTORY — DX: Depression, unspecified: F32.A

## 2016-06-22 HISTORY — DX: Major depressive disorder, single episode, unspecified: F32.9

## 2016-06-22 HISTORY — DX: Gastro-esophageal reflux disease without esophagitis: K21.9

## 2016-06-22 HISTORY — DX: Syncope and collapse: R55

## 2016-06-22 LAB — SURGICAL PCR SCREEN
MRSA, PCR: NEGATIVE
Staphylococcus aureus: NEGATIVE

## 2016-06-22 LAB — URINALYSIS, COMPLETE (UACMP) WITH MICROSCOPIC
BACTERIA UA: NONE SEEN
BILIRUBIN URINE: NEGATIVE
GLUCOSE, UA: NEGATIVE mg/dL
HGB URINE DIPSTICK: NEGATIVE
KETONES UR: NEGATIVE mg/dL
NITRITE: NEGATIVE
PROTEIN: NEGATIVE mg/dL
Specific Gravity, Urine: 1.01 (ref 1.005–1.030)
Squamous Epithelial / LPF: NONE SEEN
pH: 6 (ref 5.0–8.0)

## 2016-06-22 LAB — PROTIME-INR
INR: 1.07
Prothrombin Time: 13.9 seconds (ref 11.4–15.2)

## 2016-06-22 LAB — APTT: aPTT: 39 s — ABNORMAL HIGH (ref 24–36)

## 2016-06-22 LAB — TYPE AND SCREEN
ABO/RH(D): O POS
Antibody Screen: NEGATIVE

## 2016-06-22 NOTE — Pre-Procedure Instructions (Signed)
From ED note 06/17/16  EKG  ED ECG REPORT I, Clearnce Hasten  Youlanda Roys, the attending physician, personally viewed and interpreted this ECG.   Date: 06/17/2016  EKG Time: 1135  Rate: 69  Rhythm: normal sinus rhythm  Axis: Normal  Intervals:none  ST&T Change: T-wave inversion in  V5 and V6. Biphasic T waves in II, III, and F aVF. No ST elevation or depression. No significant changes from previous EKGs on the record

## 2016-06-22 NOTE — Patient Instructions (Signed)
Your procedure is scheduled on: Wed 07/06/16 Report to [place]. 2ND FLOOR MEDICAL MALL ENTRANCE. To find out your arrival time please call [phone] between 1PM - 3PM on Tue 07/05/16.  Remember: Instructions that are not followed completely may result in serious medical risk, up to and including death, or upon the discretion of your surgeon and anesthesiologist your surgery may need to be rescheduled.    __X__ 1. Do not eat food or drink liquids after midnight. No gum chewing or hard candies.     __X__ 2. No Alcohol for 24 hours before or after surgery.   ____ 3. Bring all medications with you on the day of surgery if instructed.    __X__ 4. Notify your doctor if there is any change in your medical condition     (cold, fever, infections).             __x___5. No smoking within 24 hours of your surgery.     Do not wear jewelry, make-up, hairpins, clips or nail polish.  Do not wear lotions, powders, or perfumes.   Do not shave 48 hours prior to surgery. Men may shave face and neck.  Do not bring valuables to the hospital.    [facility] is not responsible for any belongings or valuables.               Contacts, dentures or bridgework may not be worn into surgery.  Leave your suitcase in the car. After surgery it may be brought to your room.  For patients admitted to the hospital, discharge time is determined by your                treatment team.   Patients discharged the day of surgery will not be allowed to drive home.   Please read over the following fact sheets that you were given:   Pain Booklet and MRSA Information   __x__ Take these medicines the morning of surgery with A SIP OF WATER:    1. LISINOPRIL  2. METOPROLOL  3. PANTOPRAZOLE  4.  5.  6.  ____ Fleet Enema (as directed)   __X__ Use CHG Soap as directed  ____ Use inhalers on the day of surgery  ____ Stop metformin 2 days prior to surgery    ____ Take 1/2 of usual insulin dose the night before surgery  and none on the morning of surgery.   __X__ Stop Coumadin/Plavix/aspirin on 06/29/16  __X__ Stop Anti-inflammatories such as Advil, Aleve, Ibuprofen, Motrin, Naproxen, Naprosyn, Goodies,powder, or aspirin products.  OK to take Tylenol.   ____ Stop supplements until after surgery.    ____ Bring C-Pap to the hospital.

## 2016-06-22 NOTE — Telephone Encounter (Signed)
Patient calls wanting to schedule appointment . He currently has rib fracture seen in urgent care 06/17/16 for abdominal pain   .  He is wondering if he needs to delay his upcoming knee surgery which is scheduled for  07/06/16 please advise.   Patient was wanting to schedule appointment with you .   Please advise.

## 2016-06-22 NOTE — Telephone Encounter (Signed)
Please schedule the patient. He needs a 30 minute visit. Thanks.

## 2016-06-22 NOTE — Telephone Encounter (Signed)
No available 30 minute appointments until  4/9  , can I book in 11:15am block for 30  Minutes please advise.

## 2016-06-22 NOTE — Telephone Encounter (Signed)
I spoke with the patient and explain that his bill was sent to collections and I couldn't pull it back . He informed me that he would pay the bill this time . I informed him that in the future if he is disputing a bill please call the office when he receives the initial bill, and we may be able to make changes and resubmit the claim.

## 2016-06-23 ENCOUNTER — Telehealth: Payer: Self-pay | Admitting: *Deleted

## 2016-06-23 NOTE — Telephone Encounter (Signed)
Pt requested a appt time and date to be place on Dr Ellen Henri schedule. Pt can not come on Monday. This appt will be a reevaluation prior to knee surgery.  Pt contact [phone]

## 2016-06-23 NOTE — Telephone Encounter (Signed)
Patient is scheduled for 06/24/16

## 2016-06-23 NOTE — Telephone Encounter (Signed)
Cameron Foster has scheduled him.

## 2016-06-24 ENCOUNTER — Encounter: Payer: Self-pay | Admitting: Family Medicine

## 2016-06-24 ENCOUNTER — Ambulatory Visit (INDEPENDENT_AMBULATORY_CARE_PROVIDER_SITE_OTHER): Payer: Medicare Other | Admitting: Family Medicine

## 2016-06-24 VITALS — BP 134/76 | HR 99 | Temp 98.1°F | Wt 216.8 lb

## 2016-06-24 DIAGNOSIS — R1013 Epigastric pain: Secondary | ICD-10-CM | POA: Diagnosis not present

## 2016-06-24 DIAGNOSIS — S2242XA Multiple fractures of ribs, left side, initial encounter for closed fracture: Secondary | ICD-10-CM | POA: Diagnosis not present

## 2016-06-24 DIAGNOSIS — S2239XA Fracture of one rib, unspecified side, initial encounter for closed fracture: Secondary | ICD-10-CM | POA: Insufficient documentation

## 2016-06-24 LAB — COMPREHENSIVE METABOLIC PANEL
ALT: 13 U/L (ref 0–53)
AST: 26 U/L (ref 0–37)
Albumin: 3.8 g/dL (ref 3.5–5.2)
Alkaline Phosphatase: 63 U/L (ref 39–117)
BILIRUBIN TOTAL: 0.5 mg/dL (ref 0.2–1.2)
BUN: 13 mg/dL (ref 6–23)
CALCIUM: 9.3 mg/dL (ref 8.4–10.5)
CHLORIDE: 105 meq/L (ref 96–112)
CO2: 25 meq/L (ref 19–32)
Creatinine, Ser: 1.33 mg/dL (ref 0.40–1.50)
GFR: 55.59 mL/min — AB (ref >60.00)
Glucose, Bld: 93 mg/dL (ref 70–99)
POTASSIUM: 3.4 meq/L — AB (ref 3.5–5.1)
Sodium: 138 mEq/L (ref 135–145)
Total Protein: 7.1 g/dL (ref 6.0–8.3)

## 2016-06-24 LAB — CBC
HCT: 34.8 % — ABNORMAL LOW (ref 39.0–52.0)
HEMOGLOBIN: 11.7 g/dL — AB (ref 13.0–17.0)
MCHC: 33.6 g/dL (ref 30.0–36.0)
MCV: 85.9 fl (ref 78.0–100.0)
PLATELETS: 324 10*3/uL (ref 150.0–400.0)
RBC: 4.05 Mil/uL — ABNORMAL LOW (ref 4.22–5.81)
RDW: 13.7 % (ref 11.5–15.5)
WBC: 8 10*3/uL (ref 4.0–10.5)

## 2016-06-24 LAB — LIPASE: Lipase: 48 U/L (ref 11.0–59.0)

## 2016-06-24 MED ORDER — OXYCODONE HCL 5 MG PO TABS: 5.0000 mg | ORAL_TABLET | Freq: Three times a day (TID) | ORAL | 0 refills | Status: DC | PRN

## 2016-06-24 NOTE — Progress Notes (Signed)
  Cameron Rumps, MD Phone: [phone]  Cameron Foster. is a 76 y.o. male who presents today for same-day visit.  Patient presents following left-sided rib fractures. Has been evaluated in the emergency room after the injury for epigastric discomfort. Notes his whole core feels sore and hurts with movement. He had fairly unremarkable workup in the emergency room. Notes when he fell and broke his ribs his knees just gave out on him. No head injury or loss of consciousness. Not taking anything for pain now. He is using a spirometer. Does have some cough though nonproductive. No fevers. No shortness of breath.  PMH: Former smoker   ROS see history of present illness  Objective  Physical Exam Vitals:   06/24/16 1032  BP: 134/76  Pulse: 99  Temp: 98.1 F (36.7 C)    BP Readings from Last 3 Encounters:  06/24/16 134/76  06/22/16 (!) 133/58  06/17/16 116/76   Wt Readings from Last 3 Encounters:  06/24/16 216 lb 12.8 oz (98.3 kg)  06/22/16 205 lb (93 kg)  06/15/16 205 lb (93 kg)    Physical Exam  Constitutional: He is well-developed, well-nourished, and in no distress.  Cardiovascular: Normal rate, regular rhythm and normal heart sounds.   Pulmonary/Chest: Effort normal and breath sounds normal.  Abdominal: Soft. Bowel sounds are normal. He exhibits no distension. There is no tenderness. There is no rebound and no guarding.  Musculoskeletal:  Left lateral flank with small bruise that is nontender, no tenderness over the left ribs, there is mild muscular soreness across his epigastric area, no guarding or rebound  Skin: Skin is warm and dry.   no flail chest noted.   Assessment/Plan: Please see individual problem list.  Rib fracture Patient with left-sided rib fractures. No flail chest. Has good air movement. No rib tenderness. Suspect the discomfort that he has is related to muscular strain from coughing. We will obtain lab work as outlined below to rule out other  causes. Given continued discomfort we will give a short course of oxycodone. He'll continue the spirometer. He'll monitor for signs of infection. He will hold off on surgery until he is well healed. We'll fax the form to his surgeon. Given return precautions. He will follow-up in about a month.   Orders Placed This Encounter  Procedures  . Lipase  . CBC  . Comp Met (CMET)    Meds ordered this encounter  Medications  . oxyCODONE (ROXICODONE) 5 MG immediate release tablet    Sig: Take 1 tablet (5 mg total) by mouth every 8 (eight) hours as needed for moderate pain.    Dispense:  20 tablet    Refill:  0   Cameron Rumps, MD [place]

## 2016-06-24 NOTE — Assessment & Plan Note (Signed)
Patient with left-sided rib fractures. No flail chest. Has good air movement. No rib tenderness. Suspect the discomfort that he has is related to muscular strain from coughing. We will obtain lab work as outlined below to rule out other causes. Given continued discomfort we will give a short course of oxycodone. He'll continue the spirometer. He'll monitor for signs of infection. He will hold off on surgery until he is well healed. We'll fax the form to his surgeon. Given return precautions. He will follow-up in about a month.

## 2016-06-24 NOTE — Patient Instructions (Signed)
Nice to see you. Your discomfort is likely related to muscular strain and your rib fractures. Please continue to use the spirometer. We will treat your pain with oxycodone. This may make your drowsy so be wary. We will check some lab work today to ensure no other cause. If you develop cough productive of blood, fevers, trouble breathing, or any new or changing symptoms please seek medical attention medially.

## 2016-06-24 NOTE — Progress Notes (Signed)
Pre visit review using our clinic review tool, if applicable. No additional management support is needed unless otherwise documented below in the visit note. 

## 2016-06-28 ENCOUNTER — Encounter: Payer: Self-pay | Admitting: Family

## 2016-06-28 ENCOUNTER — Ambulatory Visit (INDEPENDENT_AMBULATORY_CARE_PROVIDER_SITE_OTHER): Payer: Medicare Other | Admitting: Family

## 2016-06-28 DIAGNOSIS — K5900 Constipation, unspecified: Secondary | ICD-10-CM | POA: Diagnosis not present

## 2016-06-28 NOTE — Pre-Procedure Instructions (Signed)
NOT CLEARED BY DR Caryl Bis DUE TO RECENT RIB FRACTURES. TO RE-EVALUATE IN 1 MONTH

## 2016-06-28 NOTE — Patient Instructions (Signed)
As discussed, suspect inadequate bowel movement   Since you are already on miralax- you may move to #2.   Constipation plan  1) Take Miralax once to twice per day until you have a bowel movement. You will end up titrating the use of Miralax. For example, you  may find that using the medication every other day or three times a week is a good bowel regimen for you. Or perhaps, twice weekly.   2)  If you do not get results with Miralax alone, you may then add Bisacodyl suppository daily to regimen until you get desired bowel results.  3) Take Colace ( stool softener) twice daily every day.   It is MOST important to drink LOTS of water and follow a HIGH fiber diet to keep foods moving through the gut. You may add Metamucil to a beverage that you drink.  Information on prevention of constipation as well as acute treatment for constipation as included below.  If there is no improvement in your symptoms, or if there is any worsening of symptoms, or if you have any additional concerns, please return to this clinic for re-evaluation; or, if we are closed, consider going to the Emergency Room for evaluation.    Constipation Prevention What is Constipation? Constipation is hard, dry bowel movements or the inability to have a bowel movement.  You can also feel like you need to have a bowel movement but not be able to.  It can also be painful when you strain to have a bowel movement.  Taking narcotic pain medicine after surgery can make you constipated, even if you have never had a problem with constipation. What Do I Need To Do? The best thing to do for constipation is to keep it from happening.  This can be done by: Adding laxatives to your daily routine, when taking prescription pain medicines after surgery. Add 17 gm Miralax daily or 100 mg Colace once or twice daily. (Miralax is mixed in water. Colace is a pill). They soften your bowel movements to make them easier to pass and hurt less. Drink  plenty of water to help flush your bowels.  (Eight, 8 ounce glasses daily) Eat foods high in fiber such as whole grains, vegetables, cereals, fruits, and prune juice (5-7 servings a day or 25 grams).  If you do not know how much fiber a food has in it you can look on the label under dietary fiber.  If you have trouble getting enough fiber in your diet you may want to consider a fiber supplement such as Metamucil or Citrucel.  Also, be aware that eating fiber without drinking enough water can make constipation worse. If you do become constipated some medications that may help are: Bisacodyl (Dulcolax) is available in tablet form or a suppository. Glycerin suppositories are also a good choice if you need a fast acting medication. Everybody is different and may have different results.  Talk to your pharmacist or health care provider about your specific problems. They can help you choose the best product for you.  Why Is It Important for Me To Do This? Being constipated is not something you have to live with.  There are many things you can do to help.   Feeling bad can interfere with your recovery after surgery.  If constipation goes on for too long it can become a very serious medical problem. You may need to visit your doctor or go to the hospital.  That is why it is very  important to drink lots of water, eat enough fiber, and keep it from happening.  Ask Questions We want to answer all of your questions and concerns.  Thats why we encourage you to use a program called Ask Me 3, created by the [employer].  By using Ask Me 3 you are encouraged to ask 3 simple (yet, potentially life saving questions) whenever you are talking with your physician, nurse or pharmacist: What is my main problem? What do I need to do? Why is it important for me to do this? By understanding the answer to these three questions and any other questions you may have, you have the knowledge  necessary to manage your health. Please feel very comfortable asking any questions. Healthcare is complicated, so if you hear an answer you do not understand, please ask your health care team to explain again.   Sources: [employer] 02-04-10 N

## 2016-06-28 NOTE — Assessment & Plan Note (Addendum)
No fever. Reassured by benign abdominal exam. Good bowel sounds. Unable to appreciate distention however abdomen is quite obese. Suspect constipation related to oxycodone use. Constipation plan including MiraLAX given to patient. Advised very close vigilance to ensure abdominal cramping  Improves;  patient will let me know. history of diverticulitis. Currently is also being treated for gastritis which is positive for H. pylori and this may also be contributory to pain.

## 2016-06-28 NOTE — Progress Notes (Signed)
Subjective:    Patient ID: Cameron Foster., male    DOB: Sep 27, 1940, 76 y.o.   MRN: [id number]  CC: Cameron Foster. is a 76 y.o. male who presents today for an acute visit.    HPI: CC: constipation x 2 days.BM today- 'however not enough and needs to unload.' Describes growing, cramping, and churning after eating. No N, V, fever, blood in stool.  Pills only bother stomach, food doesn't bother stomach. Believes oxycodone is causing constipation.  'doesn't feel like diverticulitis'.  Ate lasagna last night, had a coke and honey bun today.  Passing a lot of gas. Pain improves with BM.Tried miralax twice today. Takes miralax every day usually.   H/o diverticulitis- last 01/2016, IBS per patient.   Not taking NSAIDs.    former smoker, no alcohol    Rib pain is improving. Able to take deep breaths. Continues to use spirometry.   Colonoscopy one month ago showed diverticulosis. No polyps. EGD showed gastritis.  Follows with [facility]- prescribed flagyl, omeprazole, biaxin 3/2 for 2 weeks for h pylori infection. Hold protonix and statin for 14 days.  Seen by PCP 4 days ago for epigastric pain and thought to be muscular in nature from coughing. Given short course of oxycodone. Lipase and CMP unremarkable.  ED 3/2 for fall and upper abdominal pain. CHest CT showed mildly displaced fractures of left 8th and 9th ribs, No pneumothorax, edema.   HISTORY:  Past Medical History:  Diagnosis Date  . Alcoholism ([place])   . Arrhythmia   . Arthritis   . Asthma   . Atrial fibrillation ([place])    one episode  . Colon polyp   . Depression   . Diverticulitis   . Diverticulosis 30 years  . Fainting   . GERD (gastroesophageal reflux disease)   . History of blood clots   . Hyperlipidemia   . Hypertension   . Irritable bowel syndrome   . Prostate cancer ([place]) 2012   treated with radiation therapy.  . Vasovagal syncope   . Vertigo    Past Surgical History:  Procedure Laterality Date  . CARDIAC  CATHETERIZATION     [place] no stents  . CATARACT EXTRACTION, BILATERAL    . COLONOSCOPY  2016  . COLONOSCOPY WITH PROPOFOL N/A 05/30/2016   Procedure: COLONOSCOPY WITH PROPOFOL;  Surgeon: Lollie Sails, MD;  Location: [facility] ENDOSCOPY;  Service: Endoscopy;  Laterality: N/A;  . ESOPHAGOGASTRODUODENOSCOPY    . ESOPHAGOGASTRODUODENOSCOPY N/A 05/30/2016   Procedure: ESOPHAGOGASTRODUODENOSCOPY (EGD);  Surgeon: Lollie Sails, MD;  Location: [facility] ENDOSCOPY;  Service: Endoscopy;  Laterality: N/A;  . FRACTURE SURGERY Bilateral    right arm and left wrist  . KNEE ARTHROSCOPY    . PROSTATE SURGERY     Microwave therapy  . TONSILLECTOMY     Family History  Problem Relation Age of Onset  . Lung cancer Father   . Bipolar disorder Son   . Sudden death Son     due to drug over dose and cardiac issues  . Prostate cancer Neg Hx   . Bladder Cancer Neg Hx     Allergies: Formaldehyde Current Outpatient Prescriptions on File Prior to Visit  Medication Sig Dispense Refill  . amLODipine (NORVASC) 5 MG tablet Take 1 tablet (5 mg total) by mouth daily. (Patient taking differently: Take 5 mg by mouth every evening. ) 90 tablet 3  . clarithromycin (BIAXIN) 500 MG tablet Take 500 mg by mouth 2 (two) times daily. Springer  day course started on 06-20-16 for H pylori    . lisinopril (PRINIVIL,ZESTRIL) 10 MG tablet Take 1 tablet (10 mg total) by mouth daily. 90 tablet 3  . metoprolol succinate (TOPROL-XL) 25 MG 24 hr tablet Take 1 tablet (25 mg total) by mouth daily. 90 tablet 3  . Multiple Vitamin (MULTIVITAMIN) tablet Take 1 tablet by mouth daily.    Marland Kitchen omeprazole (PRILOSEC) 40 MG capsule TAKE ONE CAPSULE BY MOUTH ONCE DAILY (Patient taking differently: TAKE ONE CAPSULE BY MOUTH TWICE DAILY) 30 capsule 3  . oxyCODONE (ROXICODONE) 5 MG immediate release tablet Take 1 tablet (5 mg total) by mouth every 8 (eight) hours as needed for moderate pain. 20 tablet 0  . polyethylene glycol powder (GLYCOLAX/MIRALAX)  powder Take 17 g by mouth 2 (two) times daily as needed. (Patient taking differently: Take 17 g by mouth daily. ) 3350 g 1  . sertraline (ZOLOFT) 50 MG tablet Take 1 tablet (50 mg total) by mouth daily. (Patient taking differently: Take 50 mg by mouth at bedtime. ) 90 tablet 3   No current facility-administered medications on file prior to visit.     Social History  Substance Use Topics  . Smoking status: Former Smoker    Years: 27.00    Quit date: 04/18/1988  . Smokeless tobacco: Current User    Types: Chew  . Alcohol use No     Comment: former    Review of Systems  Constitutional: Negative for chills and fever.  Respiratory: Negative for cough.   Cardiovascular: Negative for chest pain and palpitations.  Gastrointestinal: Positive for abdominal distention, abdominal pain and constipation. Negative for blood in stool, diarrhea, nausea and vomiting.      Objective:    BP 132/90   Pulse 90   Temp 98 F (36.7 C) (Oral)   Ht 5' 9"  (1.753 m)   Wt 216 lb 12.8 oz (98.3 kg)   SpO2 95%   BMI 32.02 kg/m    Physical Exam  Constitutional: He appears well-developed and well-nourished.  Cardiovascular: Regular rhythm and normal heart sounds.   Pulmonary/Chest: Effort normal and breath sounds normal. No respiratory distress. He has no wheezes. He has no rales.  Abdominal: Soft. Normal appearance and bowel sounds are normal. He exhibits no distension, no fluid wave, no ascites and no mass. There is no tenderness. There is no rigidity, no rebound, no guarding, no CVA tenderness, no tenderness at McBurney's point and negative Murphy's sign.  Obese abdomen  Neurological: He is alert.  Skin: Skin is warm and dry.  Psychiatric: He has a normal mood and affect. His speech is normal and behavior is normal.  Vitals reviewed.      Assessment & Plan:      I have discontinued Cameron Foster's sennosides-docusate sodium, hydrocortisone, pantoprazole, Aspirin-Acetaminophen, and Diclofenac Sodium.  I am also having him maintain his multivitamin, polyethylene glycol powder, lisinopril, metoprolol succinate, amLODipine, sertraline, omeprazole, clarithromycin, oxyCODONE, and metroNIDAZOLE.   Meds ordered this encounter  Medications  . metroNIDAZOLE (FLAGYL) 500 MG tablet    Sig: Take by mouth.    Return precautions given.   Risks, benefits, and alternatives of the medications and treatment plan prescribed today were discussed, and patient expressed understanding.   Education regarding symptom management and diagnosis given to patient on AVS.  Continue to follow with [name], MD for routine health maintenance.   Cameron Foster. and I agreed with plan.   Mable Paris, FNP

## 2016-06-28 NOTE — Progress Notes (Signed)
Pre visit review using our clinic review tool, if applicable. No additional management support is needed unless otherwise documented below in the visit note. 

## 2016-06-30 ENCOUNTER — Other Ambulatory Visit: Payer: PRIVATE HEALTH INSURANCE

## 2016-06-30 DIAGNOSIS — R197 Diarrhea, unspecified: Secondary | ICD-10-CM | POA: Diagnosis not present

## 2016-06-30 DIAGNOSIS — K2960 Other gastritis without bleeding: Secondary | ICD-10-CM | POA: Diagnosis not present

## 2016-06-30 DIAGNOSIS — R140 Abdominal distension (gaseous): Secondary | ICD-10-CM | POA: Diagnosis not present

## 2016-06-30 DIAGNOSIS — Z8719 Personal history of other diseases of the digestive system: Secondary | ICD-10-CM | POA: Diagnosis not present

## 2016-07-01 ENCOUNTER — Other Ambulatory Visit
Admission: RE | Admit: 2016-07-01 | Discharge: 2016-07-01 | Disposition: A | Discharge status: Home / Self Care | Payer: Medicare Other | Source: Other Acute Inpatient Hospital | Attending: Gastroenterology | Admitting: Gastroenterology

## 2016-07-01 DIAGNOSIS — R197 Diarrhea, unspecified: Secondary | ICD-10-CM | POA: Diagnosis not present

## 2016-07-01 LAB — C DIFFICILE QUICK SCREEN W PCR REFLEX
C Diff antigen: NEGATIVE
C Diff interpretation: NOT DETECTED
C Diff toxin: NEGATIVE

## 2016-07-01 NOTE — Progress Notes (Signed)
Spoken to patient. He stated that he seen his GI Doctor and was given new medication for SX.

## 2016-07-05 ENCOUNTER — Other Ambulatory Visit: Payer: PRIVATE HEALTH INSURANCE

## 2016-07-06 DIAGNOSIS — R197 Diarrhea, unspecified: Secondary | ICD-10-CM | POA: Diagnosis not present

## 2016-07-18 ENCOUNTER — Ambulatory Visit (INDEPENDENT_AMBULATORY_CARE_PROVIDER_SITE_OTHER): Payer: Medicare Other

## 2016-07-18 ENCOUNTER — Ambulatory Visit (INDEPENDENT_AMBULATORY_CARE_PROVIDER_SITE_OTHER): Payer: Medicare Other | Admitting: Family Medicine

## 2016-07-18 ENCOUNTER — Encounter: Payer: Self-pay | Admitting: Family Medicine

## 2016-07-18 VITALS — BP 122/84 | HR 81 | Temp 98.8°F | Wt 207.6 lb

## 2016-07-18 DIAGNOSIS — K5900 Constipation, unspecified: Secondary | ICD-10-CM | POA: Diagnosis not present

## 2016-07-18 DIAGNOSIS — S2243XD Multiple fractures of ribs, bilateral, subsequent encounter for fracture with routine healing: Secondary | ICD-10-CM | POA: Diagnosis not present

## 2016-07-18 DIAGNOSIS — S2242XD Multiple fractures of ribs, left side, subsequent encounter for fracture with routine healing: Secondary | ICD-10-CM | POA: Diagnosis not present

## 2016-07-18 IMAGING — DX DG RIBS 2V*L*
4 series · 4 of 4 positions shown · non-contrast
Comparison: Chest x-ray of [DATE]

CLINICAL DATA: Closed fracture of multiple left-sided ribs,
follow-up study.

EXAM:
LEFT RIBS - 2 VIEW

[chest pa]
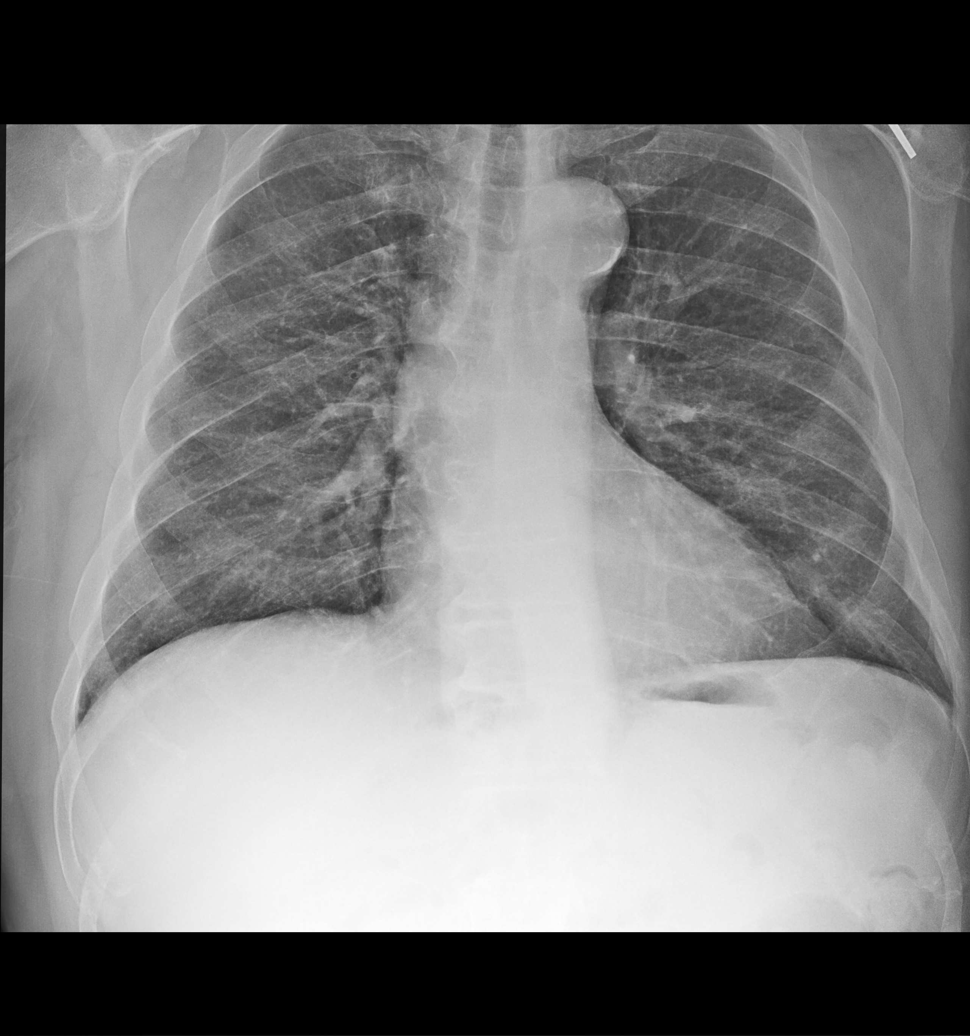

[oblique ribs obl (oblique) (1 of 2)]
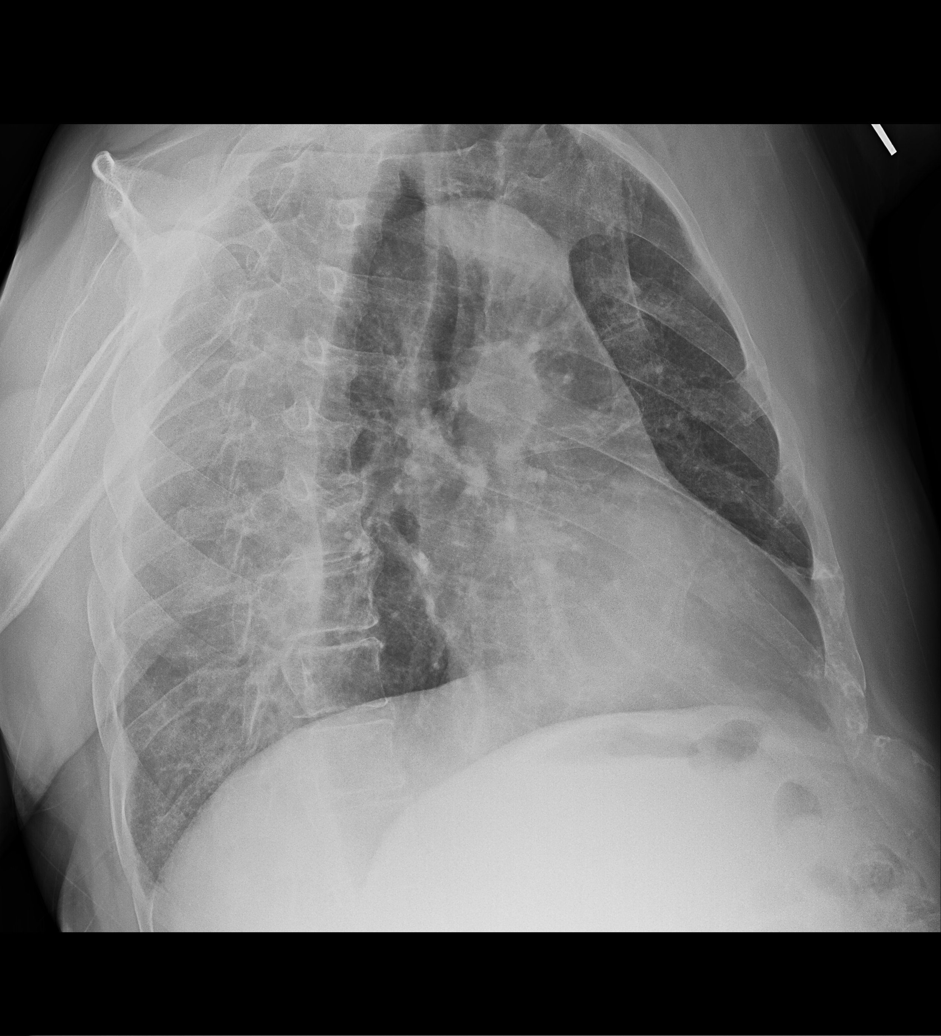

[oblique ribs obl (oblique) (2 of 2)]
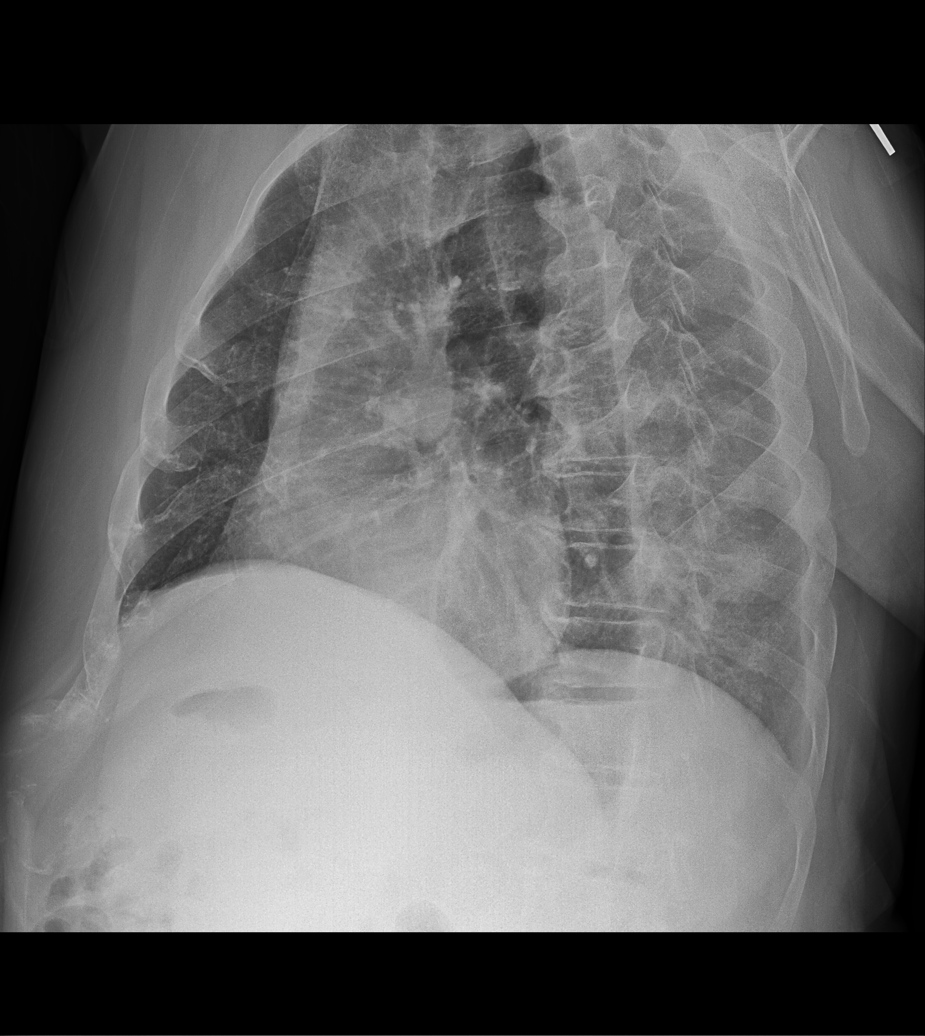

[hemithorax (ribs) pa]
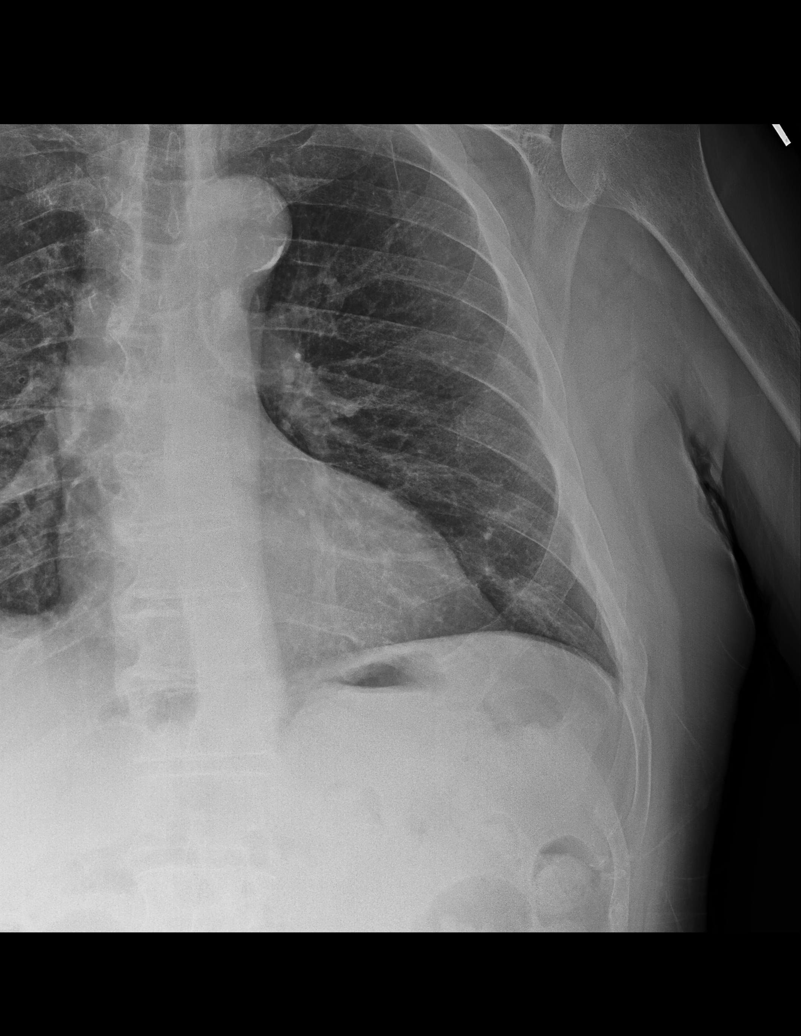

[4 of 4 positions shown; findings below may reference images not displayed]

FINDINGS: Again demonstrated are fractures of the lateral aspects of the left
seventh, eighth, and ninth ribs. A small amount of periosteal
reaction is visible. There is no pleural effusion or pneumothorax.
No other rib fractures are observed.
IMPRESSION: There is ongoing healing of fractures of the left seventh through
ninth ribs.

## 2016-07-18 NOTE — Assessment & Plan Note (Signed)
Overall bowel movements have improved. He'll follow with GI. We'll continue MiraLAX.

## 2016-07-18 NOTE — Assessment & Plan Note (Signed)
Much improved. Good air movement. No tenderness. No cough. We will repeat films. Pending results he will likely be able to proceed with surgery.

## 2016-07-18 NOTE — Progress Notes (Signed)
  [name], MD Phone: [phone]  Cameron Foster. is a 76 y.o. male who presents today for follow-up.  Patient seen in follow-up for left rib fracture. He notes the pain is minimal if any at this time and only occurs depending on certain positions. His breathing has been good. He notes no cough. No fevers. He notes he is almost 100% back to normal. He is due to have surgery next week on his knee.  Notes he is also seeing GI for his bowel movements. Notes they're moving really well with MiraLAX and are on the runny side. No blood in the stool. No abdominal pain.  ROS see history of present illness  Objective  Physical Exam Vitals:   07/18/16 1332  BP: 122/84  Pulse: 81  Temp: 98.8 F (37.1 C)    BP Readings from Last 3 Encounters:  07/18/16 122/84  06/28/16 132/90  06/24/16 134/76   Wt Readings from Last 3 Encounters:  07/18/16 207 lb 9.6 oz (94.2 kg)  06/28/16 216 lb 12.8 oz (98.3 kg)  06/24/16 216 lb 12.8 oz (98.3 kg)    Physical Exam  Constitutional: No distress.  Cardiovascular: Normal rate, regular rhythm and normal heart sounds.   Pulmonary/Chest: Effort normal and breath sounds normal.  Abdominal: Soft. He exhibits no distension. There is no tenderness.  Musculoskeletal:  No tenderness of the left ribs  Skin: He is not diaphoretic.     Assessment/Plan: Please see individual problem list.  Constipation Overall bowel movements have improved. He'll follow with GI. We'll continue MiraLAX.  Rib fracture Much improved. Good air movement. No tenderness. No cough. We will repeat films. Pending results he will likely be able to proceed with surgery.   Orders Placed This Encounter  Procedures  . DG Ribs Unilateral Left    Standing Status:   Future    Number of Occurrences:   1    Standing Expiration Date:   09/17/2017    Order Specific Question:   Reason for Exam (SYMPTOM  OR DIAGNOSIS REQUIRED)    Answer:   left sided rib fracture-follow-up imaging      Order Specific Question:   Preferred imaging location?    Answer:   [facility] Specific Question:   Radiology Contrast Protocol - do NOT remove file path    Answer:   [email or web address]      [name], MD [place]

## 2016-07-18 NOTE — Patient Instructions (Signed)
Nice to see you. We will re-x-ray your ribs. We'll contact you with the results.

## 2016-07-18 NOTE — Progress Notes (Signed)
Pre visit review using our clinic review tool, if applicable. No additional management support is needed unless otherwise documented below in the visit note. 

## 2016-07-20 ENCOUNTER — Other Ambulatory Visit: Payer: PRIVATE HEALTH INSURANCE

## 2016-07-20 DIAGNOSIS — M25661 Stiffness of right knee, not elsewhere classified: Secondary | ICD-10-CM | POA: Diagnosis not present

## 2016-07-20 DIAGNOSIS — M25561 Pain in right knee: Secondary | ICD-10-CM | POA: Diagnosis not present

## 2016-07-20 NOTE — Pre-Procedure Instructions (Signed)
FAXED AS REQUESTED BY DR SONNENBERG'S OFFICE ANOTHER  CLEARANCE  FORM. REPEAT CXR 07/18/16

## 2016-07-22 ENCOUNTER — Ambulatory Visit: Payer: PRIVATE HEALTH INSURANCE | Admitting: Family Medicine

## 2016-07-25 ENCOUNTER — Telehealth: Payer: Self-pay | Admitting: Family Medicine

## 2016-07-25 NOTE — Telephone Encounter (Signed)
Form completed.

## 2016-07-25 NOTE — Telephone Encounter (Signed)
faxed

## 2016-07-25 NOTE — Telephone Encounter (Signed)
Sherry from Anesthesiology called and is looking for surgical clearance on pt. Pt is having total knee replacement on 07/27/16. Please advise, thank you!  Call @ [phone]

## 2016-07-25 NOTE — Telephone Encounter (Signed)
Please advise 

## 2016-07-26 MED ORDER — PREGABALIN 75 MG PO CAPS
75.0000 mg | ORAL_CAPSULE | ORAL | Status: AC
  Administered 2016-07-27: 75 mg via ORAL

## 2016-07-26 MED ORDER — CELECOXIB 200 MG PO CAPS
400.0000 mg | ORAL_CAPSULE | ORAL | Status: AC
  Administered 2016-07-27: 400 mg via ORAL

## 2016-07-26 MED ORDER — VANCOMYCIN HCL 10 G IV SOLR
1500.0000 mg | INTRAVENOUS | Status: AC
  Administered 2016-07-27: 1500 mg via INTRAVENOUS
  Filled 2016-07-26: qty 1500

## 2016-07-26 NOTE — Pre-Procedure Instructions (Signed)
Cleared by dr Caryl Bis low risk 07/25/16

## 2016-07-27 ENCOUNTER — Inpatient Hospital Stay
Admission: RE | Admit: 2016-07-27 | Discharge: 2016-07-29 | DRG: 470 | Disposition: A | Discharge status: Home / Self Care | Payer: Medicare Other | Source: Ambulatory Visit | Attending: Specialist | Admitting: Specialist

## 2016-07-27 ENCOUNTER — Encounter: Payer: Self-pay | Admitting: *Deleted

## 2016-07-27 ENCOUNTER — Telehealth: Payer: Self-pay | Admitting: Urology

## 2016-07-27 ENCOUNTER — Encounter
Admission: RE | Disposition: A | Discharge status: Home / Self Care | Payer: Self-pay | Source: Ambulatory Visit | Attending: Specialist

## 2016-07-27 ENCOUNTER — Ambulatory Visit: Payer: Medicare Other | Admitting: Anesthesiology

## 2016-07-27 ENCOUNTER — Inpatient Hospital Stay: Payer: Medicare Other

## 2016-07-27 DIAGNOSIS — Z888 Allergy status to other drugs, medicaments and biological substances status: Secondary | ICD-10-CM

## 2016-07-27 DIAGNOSIS — Z86010 Personal history of colonic polyps: Secondary | ICD-10-CM | POA: Diagnosis not present

## 2016-07-27 DIAGNOSIS — Z96659 Presence of unspecified artificial knee joint: Secondary | ICD-10-CM

## 2016-07-27 DIAGNOSIS — Z79891 Long term (current) use of opiate analgesic: Secondary | ICD-10-CM | POA: Diagnosis not present

## 2016-07-27 DIAGNOSIS — F17220 Nicotine dependence, chewing tobacco, uncomplicated: Secondary | ICD-10-CM | POA: Diagnosis present

## 2016-07-27 DIAGNOSIS — M1711 Unilateral primary osteoarthritis, right knee: Principal | ICD-10-CM | POA: Diagnosis present

## 2016-07-27 DIAGNOSIS — Z8546 Personal history of malignant neoplasm of prostate: Secondary | ICD-10-CM

## 2016-07-27 DIAGNOSIS — K219 Gastro-esophageal reflux disease without esophagitis: Secondary | ICD-10-CM | POA: Diagnosis present

## 2016-07-27 DIAGNOSIS — J45909 Unspecified asthma, uncomplicated: Secondary | ICD-10-CM | POA: Diagnosis present

## 2016-07-27 DIAGNOSIS — Z923 Personal history of irradiation: Secondary | ICD-10-CM

## 2016-07-27 DIAGNOSIS — Z79899 Other long term (current) drug therapy: Secondary | ICD-10-CM

## 2016-07-27 DIAGNOSIS — M65861 Other synovitis and tenosynovitis, right lower leg: Secondary | ICD-10-CM | POA: Diagnosis present

## 2016-07-27 DIAGNOSIS — N359 Urethral stricture, unspecified: Secondary | ICD-10-CM | POA: Diagnosis present

## 2016-07-27 DIAGNOSIS — Z96651 Presence of right artificial knee joint: Secondary | ICD-10-CM | POA: Diagnosis not present

## 2016-07-27 DIAGNOSIS — Z91048 Other nonmedicinal substance allergy status: Secondary | ICD-10-CM | POA: Diagnosis not present

## 2016-07-27 DIAGNOSIS — Z471 Aftercare following joint replacement surgery: Secondary | ICD-10-CM | POA: Diagnosis not present

## 2016-07-27 DIAGNOSIS — M179 Osteoarthritis of knee, unspecified: Secondary | ICD-10-CM | POA: Diagnosis not present

## 2016-07-27 DIAGNOSIS — F329 Major depressive disorder, single episode, unspecified: Secondary | ICD-10-CM | POA: Diagnosis not present

## 2016-07-27 DIAGNOSIS — I10 Essential (primary) hypertension: Secondary | ICD-10-CM | POA: Diagnosis present

## 2016-07-27 HISTORY — PX: TOTAL KNEE ARTHROPLASTY: SHX125

## 2016-07-27 LAB — TYPE AND SCREEN
ABO/RH(D): O POS
ANTIBODY SCREEN: NEGATIVE

## 2016-07-27 IMAGING — DX DG KNEE 1-2V*R*
2 series · 2 of 2 positions shown · non-contrast
Comparison: None.

CLINICAL DATA: Follow-up total knee replacement.

EXAM:
RIGHT KNEE - 1-2 VIEW

[knee ap]
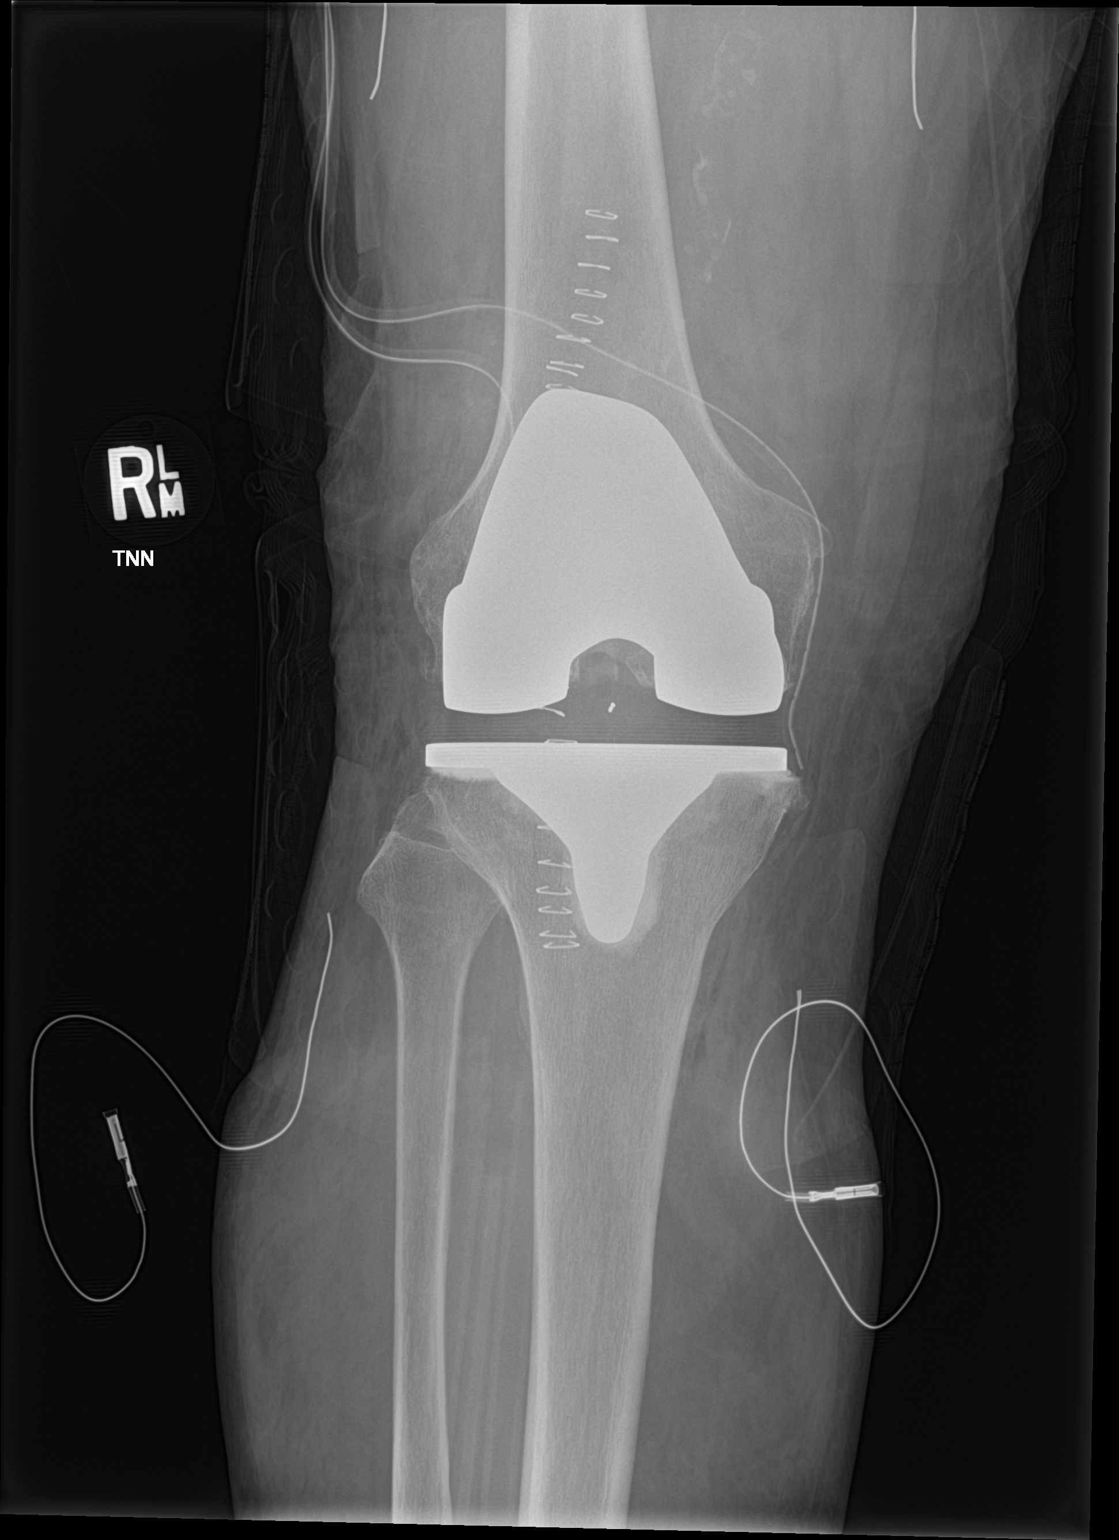

[knee lat]
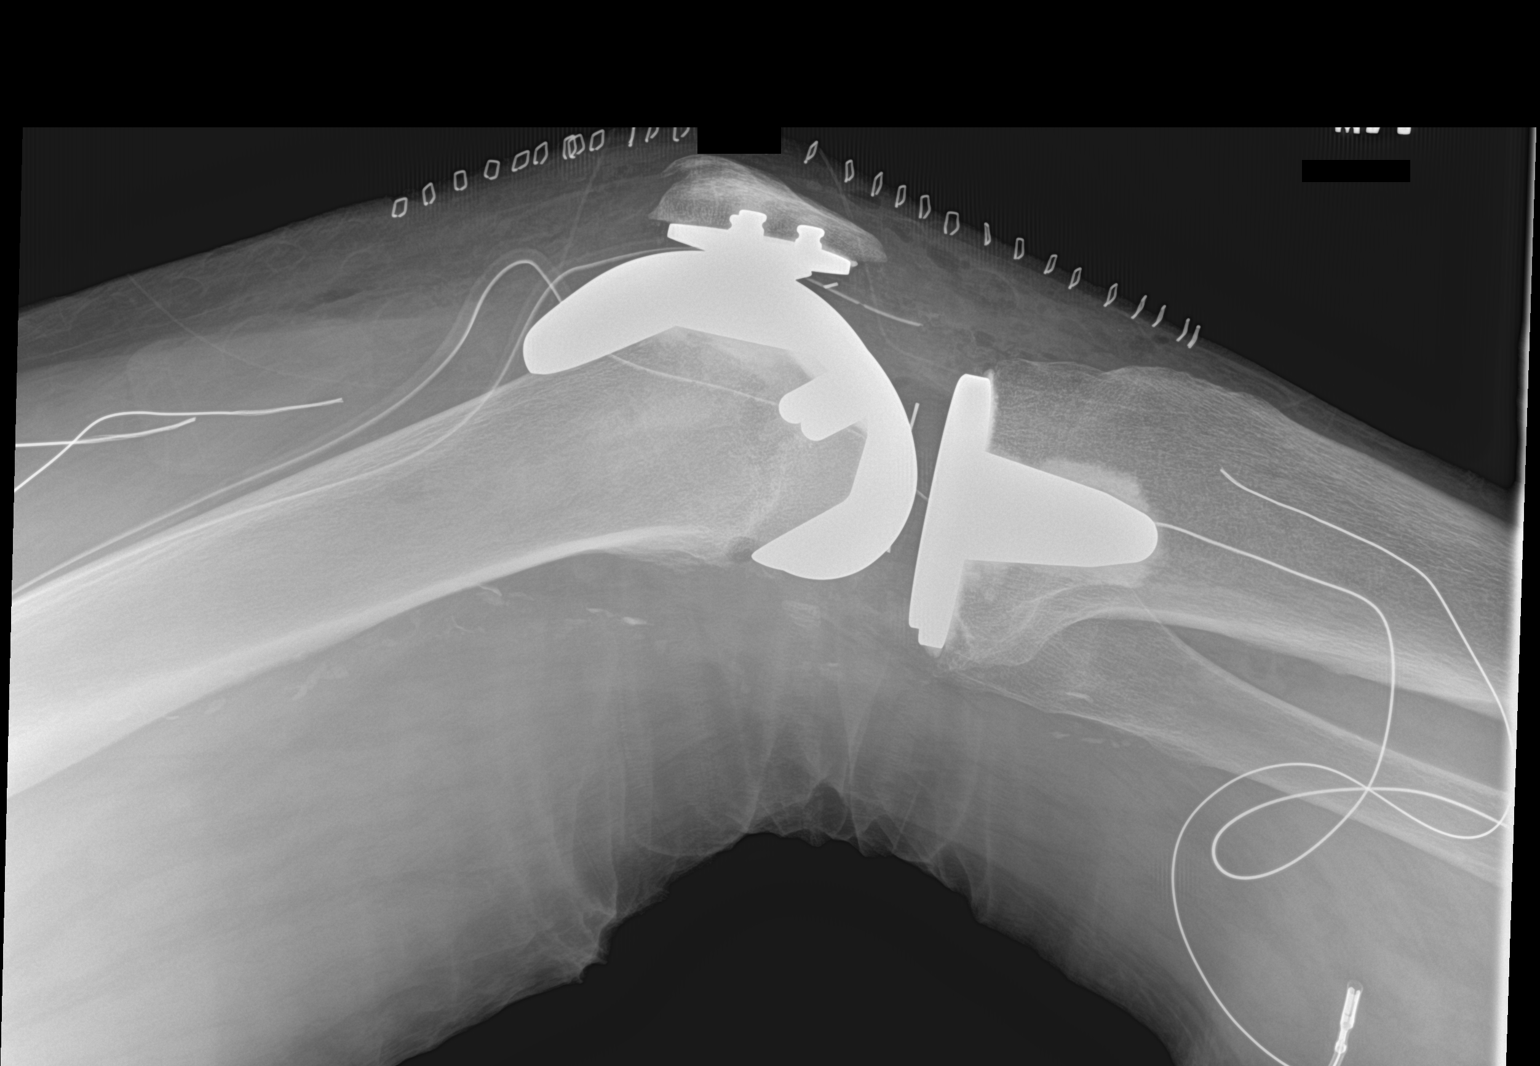

[2 of 2 positions shown; findings below may reference images not displayed]

FINDINGS: Total knee arthroplasty. Components appear well positioned.
Suprapatellar drains in place. No complication or unexpected
finding.
IMPRESSION: Good appearance following total knee arthroplasty.

## 2016-07-27 SURGERY — ARTHROPLASTY, KNEE, TOTAL
Anesthesia: Spinal | Site: Knee | Laterality: Right | Wound class: Clean

## 2016-07-27 MED ORDER — NEOMYCIN-POLYMYXIN B GU 40-200000 IR SOLN
Status: AC
  Filled 2016-07-27: qty 20

## 2016-07-27 MED ORDER — GLYCOPYRROLATE 0.2 MG/ML IJ SOLN
INTRAMUSCULAR | Status: AC
  Filled 2016-07-27: qty 1

## 2016-07-27 MED ORDER — BUPIVACAINE HCL (PF) 0.5 % IJ SOLN
INTRAMUSCULAR | Status: AC
  Filled 2016-07-27: qty 10

## 2016-07-27 MED ORDER — ACETAMINOPHEN 325 MG PO TABS
650.0000 mg | ORAL_TABLET | Freq: Four times a day (QID) | ORAL | Status: DC | PRN
  Filled 2016-07-27: qty 2

## 2016-07-27 MED ORDER — EPINEPHRINE PF 1 MG/ML IJ SOLN
INTRAMUSCULAR | Status: AC
  Filled 2016-07-27: qty 1

## 2016-07-27 MED ORDER — CHLORHEXIDINE GLUCONATE CLOTH 2 % EX PADS: 6.0000 | MEDICATED_PAD | Freq: Once | CUTANEOUS | Status: DC

## 2016-07-27 MED ORDER — NEOMYCIN-POLYMYXIN B GU 40-200000 IR SOLN
Status: DC | PRN
  Administered 2016-07-27: 16 mL

## 2016-07-27 MED ORDER — KETOROLAC TROMETHAMINE 30 MG/ML IJ SOLN
INTRAMUSCULAR | Status: AC
  Filled 2016-07-27: qty 1

## 2016-07-27 MED ORDER — EPINEPHRINE PF 1 MG/ML IJ SOLN
INTRAMUSCULAR | Status: DC | PRN
  Administered 2016-07-27: .001 mg via INTRATHECAL

## 2016-07-27 MED ORDER — POLYETHYLENE GLYCOL 3350 17 GM/SCOOP PO POWD
17.0000 g | Freq: Every day | ORAL | Status: DC
  Filled 2016-07-27: qty 255

## 2016-07-27 MED ORDER — LIDOCAINE HCL (PF) 2 % IJ SOLN
INTRAMUSCULAR | Status: AC
  Filled 2016-07-27: qty 2

## 2016-07-27 MED ORDER — ONDANSETRON HCL 4 MG/2ML IJ SOLN: 4.0000 mg | Freq: Four times a day (QID) | INTRAMUSCULAR | Status: DC | PRN

## 2016-07-27 MED ORDER — PREGABALIN 75 MG PO CAPS
ORAL_CAPSULE | ORAL | Status: AC
  Filled 2016-07-27: qty 1

## 2016-07-27 MED ORDER — ADULT MULTIVITAMIN W/MINERALS CH
1.0000 | ORAL_TABLET | Freq: Every day | ORAL | Status: DC
  Administered 2016-07-28 – 2016-07-29 (×2): 1 via ORAL
  Filled 2016-07-27 (×3): qty 1

## 2016-07-27 MED ORDER — POLYETHYLENE GLYCOL 3350 17 G PO PACK
17.0000 g | PACK | Freq: Every day | ORAL | Status: DC
  Administered 2016-07-27 – 2016-07-29 (×3): 17 g via ORAL
  Filled 2016-07-27 (×3): qty 1

## 2016-07-27 MED ORDER — MORPHINE SULFATE (PF) 4 MG/ML IV SOLN
INTRAVENOUS | Status: AC
  Filled 2016-07-27: qty 1

## 2016-07-27 MED ORDER — GLYCOPYRROLATE 0.2 MG/ML IJ SOLN
INTRAMUSCULAR | Status: DC | PRN
Start: 2016-07-27 — End: 2016-07-27
  Administered 2016-07-27: 0.2 mg via INTRAVENOUS

## 2016-07-27 MED ORDER — SERTRALINE HCL 50 MG PO TABS
50.0000 mg | ORAL_TABLET | Freq: Every day | ORAL | Status: DC
  Administered 2016-07-27 – 2016-07-28 (×2): 50 mg via ORAL
  Filled 2016-07-27 (×2): qty 1

## 2016-07-27 MED ORDER — CEFAZOLIN SODIUM 1 G IJ SOLR
INTRAMUSCULAR | Status: AC
  Filled 2016-07-27: qty 20

## 2016-07-27 MED ORDER — ZOLPIDEM TARTRATE 5 MG PO TABS: 5.0000 mg | ORAL_TABLET | Freq: Every evening | ORAL | Status: DC | PRN

## 2016-07-27 MED ORDER — LIDOCAINE HCL (PF) 2 % IJ SOLN
INTRAMUSCULAR | Status: DC | PRN
  Administered 2016-07-27: 50 mg

## 2016-07-27 MED ORDER — METOCLOPRAMIDE HCL 5 MG/ML IJ SOLN: 5.0000 mg | Freq: Three times a day (TID) | INTRAMUSCULAR | Status: DC | PRN

## 2016-07-27 MED ORDER — SENNA 8.6 MG PO TABS
1.0000 | ORAL_TABLET | Freq: Two times a day (BID) | ORAL | Status: DC
  Administered 2016-07-27 – 2016-07-29 (×5): 8.6 mg via ORAL
  Filled 2016-07-27 (×5): qty 1

## 2016-07-27 MED ORDER — BUPIVACAINE LIPOSOME 1.3 % IJ SUSP
INTRAMUSCULAR | Status: AC
  Filled 2016-07-27: qty 20

## 2016-07-27 MED ORDER — BUPIVACAINE LIPOSOME 1.3 % IJ SUSP
INTRAMUSCULAR | Status: DC | PRN
  Administered 2016-07-27: 60 mL

## 2016-07-27 MED ORDER — MORPHINE SULFATE (PF) 2 MG/ML IV SOLN: 1.0000 mg | INTRAVENOUS | Status: DC | PRN

## 2016-07-27 MED ORDER — METHOCARBAMOL 500 MG PO TABS
500.0000 mg | ORAL_TABLET | Freq: Four times a day (QID) | ORAL | Status: DC | PRN
  Administered 2016-07-28: 500 mg via ORAL
  Filled 2016-07-27: qty 1

## 2016-07-27 MED ORDER — CELECOXIB 200 MG PO CAPS
200.0000 mg | ORAL_CAPSULE | Freq: Two times a day (BID) | ORAL | Status: DC
  Administered 2016-07-27 – 2016-07-29 (×5): 200 mg via ORAL
  Filled 2016-07-27 (×5): qty 1

## 2016-07-27 MED ORDER — METHOCARBAMOL 1000 MG/10ML IJ SOLN: 500.0000 mg | Freq: Four times a day (QID) | INTRAVENOUS | Status: DC | PRN

## 2016-07-27 MED ORDER — OXYCODONE HCL 5 MG/5ML PO SOLN: 5.0000 mg | Freq: Once | ORAL | Status: DC | PRN

## 2016-07-27 MED ORDER — AMLODIPINE BESYLATE 5 MG PO TABS
5.0000 mg | ORAL_TABLET | Freq: Every evening | ORAL | Status: DC
  Filled 2016-07-27: qty 1

## 2016-07-27 MED ORDER — ALUM & MAG HYDROXIDE-SIMETH 200-200-20 MG/5ML PO SUSP: 30.0000 mL | ORAL | Status: DC | PRN

## 2016-07-27 MED ORDER — LACTATED RINGERS IV SOLN
INTRAVENOUS | Status: DC
  Administered 2016-07-27 (×2): via INTRAVENOUS

## 2016-07-27 MED ORDER — MIDAZOLAM HCL 2 MG/2ML IJ SOLN
INTRAMUSCULAR | Status: AC
  Filled 2016-07-27: qty 2

## 2016-07-27 MED ORDER — PHENOL 1.4 % MT LIQD: 1.0000 | OROMUCOSAL | Status: DC | PRN

## 2016-07-27 MED ORDER — BUPIVACAINE HCL (PF) 0.25 % IJ SOLN
INTRAMUSCULAR | Status: DC | PRN
  Administered 2016-07-27: 30 mL

## 2016-07-27 MED ORDER — BUPIVACAINE HCL (PF) 0.25 % IJ SOLN
INTRAMUSCULAR | Status: AC
  Filled 2016-07-27: qty 30

## 2016-07-27 MED ORDER — ONDANSETRON HCL 4 MG PO TABS: 4.0000 mg | ORAL_TABLET | Freq: Four times a day (QID) | ORAL | Status: DC | PRN

## 2016-07-27 MED ORDER — KETOROLAC TROMETHAMINE 30 MG/ML IJ SOLN
INTRAMUSCULAR | Status: DC | PRN
  Administered 2016-07-27: 30 mg via INTRAVENOUS

## 2016-07-27 MED ORDER — PROPOFOL 500 MG/50ML IV EMUL
INTRAVENOUS | Status: AC
  Filled 2016-07-27: qty 50

## 2016-07-27 MED ORDER — BISACODYL 10 MG RE SUPP: 10.0000 mg | Freq: Every day | RECTAL | Status: DC | PRN

## 2016-07-27 MED ORDER — LISINOPRIL 10 MG PO TABS: 10.0000 mg | ORAL_TABLET | Freq: Every day | ORAL | Status: DC

## 2016-07-27 MED ORDER — PREGABALIN 75 MG PO CAPS
75.0000 mg | ORAL_CAPSULE | Freq: Two times a day (BID) | ORAL | Status: DC
  Administered 2016-07-27 – 2016-07-28 (×4): 75 mg via ORAL
  Filled 2016-07-27 (×5): qty 1

## 2016-07-27 MED ORDER — SODIUM CHLORIDE 0.9 % IJ SOLN
INTRAMUSCULAR | Status: AC
  Filled 2016-07-27: qty 100

## 2016-07-27 MED ORDER — SODIUM CHLORIDE 0.9 % IV SOLN
Freq: Once | INTRAVENOUS | Status: AC
  Administered 2016-07-27: 15:00:00 via INTRAVENOUS

## 2016-07-27 MED ORDER — PROPOFOL 500 MG/50ML IV EMUL
INTRAVENOUS | Status: DC | PRN
  Administered 2016-07-27: 25 ug/kg/min via INTRAVENOUS

## 2016-07-27 MED ORDER — FENTANYL CITRATE (PF) 100 MCG/2ML IJ SOLN
INTRAMUSCULAR | Status: DC | PRN
  Administered 2016-07-27 (×2): 50 ug via INTRAVENOUS

## 2016-07-27 MED ORDER — SODIUM CHLORIDE 0.45 % IV SOLN
INTRAVENOUS | Status: DC
  Administered 2016-07-27 – 2016-07-28 (×2): via INTRAVENOUS

## 2016-07-27 MED ORDER — RIVAROXABAN 10 MG PO TABS
10.0000 mg | ORAL_TABLET | Freq: Every day | ORAL | Status: DC
  Administered 2016-07-28 – 2016-07-29 (×2): 10 mg via ORAL
  Filled 2016-07-27 (×2): qty 1

## 2016-07-27 MED ORDER — SODIUM CHLORIDE 0.9 % IJ SOLN
INTRAMUSCULAR | Status: AC
  Filled 2016-07-27: qty 10

## 2016-07-27 MED ORDER — SODIUM CHLORIDE 0.9 % IV SOLN
INTRAVENOUS | Status: DC | PRN
  Administered 2016-07-27: 30 mL via INTRAMUSCULAR

## 2016-07-27 MED ORDER — FENTANYL CITRATE (PF) 100 MCG/2ML IJ SOLN
INTRAMUSCULAR | Status: AC
  Filled 2016-07-27: qty 2

## 2016-07-27 MED ORDER — FLEET ENEMA 7-19 GM/118ML RE ENEM: 1.0000 | ENEMA | Freq: Once | RECTAL | Status: DC | PRN

## 2016-07-27 MED ORDER — FENTANYL CITRATE (PF) 100 MCG/2ML IJ SOLN
25.0000 ug | INTRAMUSCULAR | Status: DC | PRN
  Administered 2016-07-27 (×2): 25 ug via INTRAVENOUS
  Administered 2016-07-27: 50 ug via INTRAVENOUS

## 2016-07-27 MED ORDER — DIPHENHYDRAMINE HCL 12.5 MG/5ML PO ELIX: 12.5000 mg | ORAL_SOLUTION | ORAL | Status: DC | PRN

## 2016-07-27 MED ORDER — CELECOXIB 200 MG PO CAPS
ORAL_CAPSULE | ORAL | Status: AC
  Filled 2016-07-27: qty 2

## 2016-07-27 MED ORDER — OXYCODONE HCL 5 MG PO TABS
5.0000 mg | ORAL_TABLET | Freq: Three times a day (TID) | ORAL | Status: DC | PRN
Start: 2016-07-27 — End: 2016-07-29
  Administered 2016-07-27 – 2016-07-29 (×5): 5 mg via ORAL
  Filled 2016-07-27 (×5): qty 1

## 2016-07-27 MED ORDER — PANTOPRAZOLE SODIUM 40 MG PO TBEC
40.0000 mg | DELAYED_RELEASE_TABLET | Freq: Every day | ORAL | Status: DC
  Administered 2016-07-27 – 2016-07-29 (×3): 40 mg via ORAL
  Filled 2016-07-27 (×3): qty 1

## 2016-07-27 MED ORDER — MORPHINE SULFATE (PF) 4 MG/ML IV SOLN
INTRAVENOUS | Status: DC | PRN
  Administered 2016-07-27: 4 mg via INTRAVENOUS

## 2016-07-27 MED ORDER — CEFAZOLIN SODIUM 1 G IJ SOLR
INTRAMUSCULAR | Status: DC | PRN
  Administered 2016-07-27: 2 g

## 2016-07-27 MED ORDER — MIDAZOLAM HCL 5 MG/5ML IJ SOLN
INTRAMUSCULAR | Status: DC | PRN
  Administered 2016-07-27: 2 mg via INTRAVENOUS

## 2016-07-27 MED ORDER — BUPIVACAINE HCL (PF) 0.5 % IJ SOLN
INTRAMUSCULAR | Status: DC | PRN
  Administered 2016-07-27: 3 mL via INTRATHECAL

## 2016-07-27 MED ORDER — METOPROLOL SUCCINATE ER 25 MG PO TB24
25.0000 mg | ORAL_TABLET | Freq: Every day | ORAL | Status: DC
Start: 2016-07-28 — End: 2016-07-29
  Administered 2016-07-28: 25 mg via ORAL
  Filled 2016-07-27 (×2): qty 1

## 2016-07-27 MED ORDER — FERROUS SULFATE 325 (65 FE) MG PO TABS
325.0000 mg | ORAL_TABLET | Freq: Three times a day (TID) | ORAL | Status: DC
  Administered 2016-07-27 – 2016-07-29 (×6): 325 mg via ORAL
  Filled 2016-07-27 (×6): qty 1

## 2016-07-27 MED ORDER — ACETAMINOPHEN 500 MG PO TABS
1000.0000 mg | ORAL_TABLET | Freq: Four times a day (QID) | ORAL | Status: AC
  Administered 2016-07-27 – 2016-07-28 (×4): 1000 mg via ORAL
  Filled 2016-07-27 (×3): qty 2

## 2016-07-27 MED ORDER — OXYCODONE HCL 5 MG PO TABS: 5.0000 mg | ORAL_TABLET | Freq: Once | ORAL | Status: DC | PRN

## 2016-07-27 MED ORDER — EPHEDRINE SULFATE 50 MG/ML IJ SOLN
INTRAMUSCULAR | Status: AC
  Filled 2016-07-27: qty 1

## 2016-07-27 MED ORDER — ACETAMINOPHEN 650 MG RE SUPP: 650.0000 mg | Freq: Four times a day (QID) | RECTAL | Status: DC | PRN

## 2016-07-27 MED ORDER — MAGNESIUM HYDROXIDE 400 MG/5ML PO SUSP
30.0000 mL | Freq: Every day | ORAL | Status: DC | PRN
  Administered 2016-07-28: 30 mL via ORAL
  Filled 2016-07-27: qty 30

## 2016-07-27 MED ORDER — FENTANYL CITRATE (PF) 100 MCG/2ML IJ SOLN
INTRAMUSCULAR | Status: AC
  Administered 2016-07-27: 25 ug via INTRAVENOUS
  Filled 2016-07-27: qty 2

## 2016-07-27 MED ORDER — PHENYLEPHRINE HCL 10 MG/ML IJ SOLN
INTRAMUSCULAR | Status: AC
  Filled 2016-07-27: qty 1

## 2016-07-27 MED ORDER — SUCRALFATE 1 G PO TABS
1.0000 g | ORAL_TABLET | Freq: Two times a day (BID) | ORAL | Status: DC
  Administered 2016-07-27 – 2016-07-29 (×5): 1 g via ORAL
  Filled 2016-07-27 (×5): qty 1

## 2016-07-27 MED ORDER — METOCLOPRAMIDE HCL 10 MG PO TABS: 5.0000 mg | ORAL_TABLET | Freq: Three times a day (TID) | ORAL | Status: DC | PRN

## 2016-07-27 MED ORDER — MENTHOL 3 MG MT LOZG: 1.0000 | LOZENGE | OROMUCOSAL | Status: DC | PRN

## 2016-07-27 MED ORDER — PANTOPRAZOLE SODIUM 20 MG PO TBEC: 20.0000 mg | DELAYED_RELEASE_TABLET | Freq: Every day | ORAL | Status: DC

## 2016-07-27 SURGICAL SUPPLY — 56 items
ADAPTER SCOPE UROLOK II (MISCELLANEOUS) ×2 IMPLANT
AUTOTRANSFUS HAS 1/8 (MISCELLANEOUS) ×2
BLADE CLIPPER SURG (BLADE) ×2 IMPLANT
BLADE DEBAKEY 8.0 (BLADE) ×4 IMPLANT
BLADE SAGITTAL WIDE XTHICK NO (BLADE) ×2 IMPLANT
CANISTER SUCT 1200ML W/VALVE (MISCELLANEOUS) ×2 IMPLANT
CANISTER SUCT 3000ML (MISCELLANEOUS) ×2 IMPLANT
CAP KNEE TOTAL 3 SIGMA ×2 IMPLANT
CATH FOLEY 2W COUNCIL 5CC 16FR (CATHETERS) ×2 IMPLANT
CATH TRAY METER 16FR LF (MISCELLANEOUS) ×4 IMPLANT
CEMENT HV SMART SET (Cement) ×4 IMPLANT
CHLORAPREP W/TINT 26ML (MISCELLANEOUS) ×4 IMPLANT
COOLER POLAR GLACIER W/PUMP (MISCELLANEOUS) ×2 IMPLANT
CUFF TOURN 24 STER (MISCELLANEOUS) IMPLANT
CUFF TOURN 30 STER DUAL PORT (MISCELLANEOUS) ×2 IMPLANT
DECANTER SPIKE VIAL GLASS SM (MISCELLANEOUS) ×6 IMPLANT
DRAPE INCISE IOBAN 66X60 STRL (DRAPES) ×2 IMPLANT
DRAPE SHEET LG 3/4 BI-LAMINATE (DRAPES) ×4 IMPLANT
DRSG AQUACEL AG ADV 3.5X10 (GAUZE/BANDAGES/DRESSINGS) IMPLANT
DRSG AQUACEL AG ADV 3.5X14 (GAUZE/BANDAGES/DRESSINGS) ×2 IMPLANT
ELECT REM PT RETURN 9FT ADLT (ELECTROSURGICAL) ×2
ELECTRODE REM PT RTRN 9FT ADLT (ELECTROSURGICAL) ×1 IMPLANT
GLOVE BIO SURGEON STRL SZ7.5 (GLOVE) ×2 IMPLANT
GLOVE BIO SURGEON STRL SZ8 (GLOVE) ×2 IMPLANT
GLOVE BIOGEL PI IND STRL 8.5 (GLOVE) ×1 IMPLANT
GLOVE BIOGEL PI INDICATOR 8.5 (GLOVE) ×1
GLOVE INDICATOR 8.0 STRL GRN (GLOVE) ×2 IMPLANT
GLOVE SURG ORTHO 8.5 STRL (GLOVE) ×2 IMPLANT
GOWN STRL REUS W/ TWL LRG LVL4 (GOWN DISPOSABLE) ×1 IMPLANT
GOWN STRL REUS W/TWL LRG LVL4 (GOWN DISPOSABLE) ×3 IMPLANT
GUIDEWIRE GREEN .038 145CM (MISCELLANEOUS) ×2 IMPLANT
IMMBOLIZER KNEE 19 BLUE UNIV (SOFTGOODS) ×2 IMPLANT
KIT RM TURNOVER STRD PROC AR (KITS) ×2 IMPLANT
NEEDLE SPNL 20GX3.5 QUINCKE YW (NEEDLE) ×2 IMPLANT
NS IRRIG 1000ML POUR BTL (IV SOLUTION) ×2 IMPLANT
PACK TOTAL KNEE (MISCELLANEOUS) ×2 IMPLANT
PAD WRAPON POLAR KNEE (MISCELLANEOUS) ×1 IMPLANT
PULSAVAC PLUS IRRIG FAN TIP (DISPOSABLE) ×2
SET CYSTO W/LG BORE CLAMP LF (SET/KITS/TRAYS/PACK) ×2 IMPLANT
SOL .9 NS 3000ML IRR  AL (IV SOLUTION) ×1
SOL .9 NS 3000ML IRR UROMATIC (IV SOLUTION) ×1 IMPLANT
SPONGE LAP 18X18 5 PK (GAUZE/BANDAGES/DRESSINGS) ×2 IMPLANT
STAPLER SKIN PROX 35W (STAPLE) ×2 IMPLANT
SUCTION FRAZIER HANDLE 10FR (MISCELLANEOUS) ×1
SUCTION TUBE FRAZIER 10FR DISP (MISCELLANEOUS) ×1 IMPLANT
SUT BONE WAX W31G (SUTURE) ×2 IMPLANT
SUT DVC 2 QUILL PDO  T11 36X36 (SUTURE) ×1
SUT DVC 2 QUILL PDO T11 36X36 (SUTURE) ×1 IMPLANT
SUT QUILL PDO 0 36 36 VIOLET (SUTURE) ×2 IMPLANT
SYR 20CC LL (SYRINGE) ×6 IMPLANT
SYSTEM AUTOTRANSFUS DUAL TROCR (MISCELLANEOUS) ×1 IMPLANT
TAPE MICROFOAM 4IN (TAPE) ×2 IMPLANT
TIP FAN IRRIG PULSAVAC PLUS (DISPOSABLE) ×1 IMPLANT
TOWER CARTRIDGE SMART MIX (DISPOSABLE) ×2 IMPLANT
TUBE SUCT KAM VAC (TUBING) ×2 IMPLANT
WRAPON POLAR PAD KNEE (MISCELLANEOUS) ×2

## 2016-07-27 NOTE — Anesthesia Preprocedure Evaluation (Signed)
Anesthesia Evaluation  Patient identified by MRN, date of birth, ID band Patient awake    Reviewed: Allergy & Precautions, H&P , NPO status , Patient's Chart, lab work & pertinent test results  History of Anesthesia Complications (+) POST - OP SPINAL HEADACHE and history of anesthetic complications  Airway Mallampati: III  TM Distance: <3 FB Neck ROM: limited    Dental  (+) Poor Dentition, Chipped, Missing, Partial Upper   Pulmonary neg shortness of breath, asthma , former smoker,    Pulmonary exam normal breath sounds clear to auscultation       Cardiovascular Exercise Tolerance: Good hypertension, (-) angina(-) Past MI and (-) DOE Normal cardiovascular exam Rhythm:regular Rate:Normal     Neuro/Psych PSYCHIATRIC DISORDERS Anxiety Depression negative neurological ROS     GI/Hepatic Neg liver ROS, GERD  Controlled and Medicated,  Endo/Other  negative endocrine ROS  Renal/GU      Musculoskeletal  (+) Arthritis ,   Abdominal   Peds  Hematology negative hematology ROS (+)   Anesthesia Other Findings Past Medical History: No date: Alcoholism ([place]) No date: Arrhythmia No date: Arthritis No date: Asthma No date: Atrial fibrillation ([facility])     Comment: one episode No date: Colon polyp No date: Depression No date: Diverticulitis 30 years: Diverticulosis No date: Fainting No date: GERD (gastroesophageal reflux disease) No date: History of blood clots No date: Hyperlipidemia No date: Hypertension No date: Irritable bowel syndrome 2012: Prostate cancer ([place])     Comment: treated with radiation therapy. No date: Vasovagal syncope No date: Vertigo  Past Surgical History: No date: CARDIAC CATHETERIZATION     Comment: [place] no stents No date: CATARACT EXTRACTION, BILATERAL 2016: COLONOSCOPY 05/30/2016: COLONOSCOPY WITH PROPOFOL N/A     Comment: Procedure: COLONOSCOPY WITH PROPOFOL;    Surgeon: Lollie Sails, MD;  Location: [facility]              ENDOSCOPY;  Service: Endoscopy;  Laterality:               N/A; No date: ESOPHAGOGASTRODUODENOSCOPY 05/30/2016: ESOPHAGOGASTRODUODENOSCOPY N/A     Comment: Procedure: ESOPHAGOGASTRODUODENOSCOPY (EGD);                Surgeon: Lollie Sails, MD;  Location: [facility]              ENDOSCOPY;  Service: Endoscopy;  Laterality:               N/A; No date: FRACTURE SURGERY Bilateral     Comment: right arm and left wrist No date: KNEE ARTHROSCOPY No date: PROSTATE SURGERY     Comment: Microwave therapy No date: TONSILLECTOMY     Reproductive/Obstetrics negative OB ROS                             Anesthesia Physical Anesthesia Plan  ASA: III  Anesthesia Plan: Spinal   Post-op Pain Management:    Induction:   Airway Management Planned:   Additional Equipment:   Intra-op Plan:   Post-operative Plan:   Informed Consent: I have reviewed the patients History and Physical, chart, labs and discussed the procedure including the risks, benefits and alternatives for the proposed anesthesia with the patient or authorized representative who has indicated his/her understanding and acceptance.   Dental Advisory Given  Plan Discussed with: Anesthesiologist, CRNA and Surgeon  Anesthesia Plan Comments:         Anesthesia Quick Evaluation

## 2016-07-27 NOTE — Op Note (Signed)
DATE OF SURGERY:  07/27/2016 TIME: 11:14 AM  PATIENT NAME:  Cameron Foster.   AGE: 76 y.o.    PRE-OPERATIVE DIAGNOSIS:  M17.11 rightl primary osteoarthritis of knee  POST-OPERATIVE DIAGNOSIS:  Same  PROCEDURE:  TOTAL KNEE ARTHROPLASTY RIGHT KNEE   SURGEON:  Hartford Maulden E, MD   ASSISTANT: Carlynn Spry, PAC  OPERATIVE IMPLANTS: Depuy LCS Femur/Patella size LARGE, Tibia size #5,  Rotating platform polyethylene size 10 MM    Total tourniquet time was 107  minutes.  PREOPERATIVE INDICATIONS:  Malek Skog. is a 76 y.o. year old male with end stage bone on bone degenerative arthritis of the knee who failed conservative treatment, including injections, antiinflammatories, activity modification, and assistive devices, and had significant impairment of their activities of daily living, and elected for Total Knee Arthroplasty.   The risks, benefits, and alternatives were discussed at length including but not limited to the risks of infection, bleeding, nerve injury, stiffness, blood clots, the need for revision surgery, cardiopulmonary complications, among others, and they were willing to proceed.  OPERATIVE FINDINGS AND UNIQUE ASPECTS OF THE CASE:  ADVANCED OSTEOARTHRITIS THROUGHOUT KNEE WITH SYNOVITIS  OPERATIVE DESCRIPTION:   The patient was brought to the operative room and placed in a supine position. Spinal anesthesia was administered. IV VANCOMYCIN antibiotics were given. Due to prior prostate surgery, Dr Erlene Quan of urology had to come in to pass a foley catheter. She recommended leaving this in for 5 days. An appointment was made for him at her office for 08/01/16 at 8:45 AM. The lower extremity was then prepped and draped in the usual sterile fashion. Time out was performed. The leg was elevated and exsanguinated and the tourniquet was inflated to 350 mmHg  An anterior midline incision was made.  Anterior quadriceps tendon splitting approach was performed. The patella was  everted and osteophytes were removed. The anterior horn of the medial and lateral meniscus was removed.  Then the extramedullary tibial cutting jig was utilized making the appropriate cut using the anterior tibial crest as a reference building in appropriate posterior slope. Care was taken during the cut to protect the medial and collateral ligaments. The proximal tibia was removed along with the posterior horns of the menisci. The PCL was sacrificed.  The distal femur was sized as above . Medial release was carried out. The anterior femoral cutting guide was aligned and centering hole made. The rotation guide was inserted and the anterior cutting guide pinned in place, and was in excellent alignment. The posterior femoral cuts were made. The flexion gap was established. The distal femoral cutting guide was introduced at 4 of valgus. This was pinned and the distal femoral cut made. The extension gap was established and was stable. The finishing guide was applied and finishing cuts made. The Mchale retractor was inserted and the keeled tibial trial was pinned in place. Centering hole was made and the keel inserted. The femoral component was inserted along with a polyethylene  insert and the knee articulated.  Extension and flexion showed good stability throughout. The patella was then sized and cut made for the patellar component. Centering holes were made. The trial was inserted and the knee articulated nicely with no need for lateral release. The trials were all removed and the knee thoroughly irrigated with pulsed lavage. Exparil was injected. The knee was dried and the cement mixed. The  keeled tibial component,  femoral component and patellar components were all cemented in place and excess cement was removed.  The cement was allowed to harden for 10 minutes. Further irrigation was carried out. Bone wax was applied to all raw bony surfaces. Autovac drains were inserted. Quarter percent plain Marcaine, Toradol  and morphine were injected. The capsule was closed with #2 Quill suture, and the subcutaneous tissues were closed with 0 Quill suture. The skin was closed with staples.Sponge and needle counts were correct.  Aquacel dressing with TENS pads and a dry sterile dressing were applied. Polar Care and knee immobilizer were applied. Tourniquet was deflated with excellent return of blood flow to foot. Patient was transferred to a hospital bed and taken to the recovery room in good condition.  Park Breed, MD

## 2016-07-27 NOTE — Progress Notes (Signed)
Date of procedure: 07/27/16  Preoperative diagnosis:  1. Difficult Foley  2. History of prostate cancer 3. History of urethral stricture  Postoperative diagnosis:  1. As above 2. Prostatic urethral false pass   Procedure: 1. Cystoscopy 2. Placement of Foley catheter over wire  Surgeon: Hollice Espy, MD  Anesthesia: General  Complications: None  Intraoperative findings: Irregular prostatic contour with large posterior prostatic urethral false pass, somewhat tight bladder neck.  EBL: Minimal  Specimens: None  Drains: 49 French council tip catheter  Indication: Cameron Foster. is a 76 y.o. patient with history of prostate cancer status post radiation as well as a history of clot retention and urethral stricture requiring dilation and clot evacuated in the past.  I was called by Dr. Sabra Heck as intraoperative consult upon difficulty placing the catheter. Of note, there was blood at the tip of the catheter.   Description of procedure:  Upon my arrival to the operating room, the patient was ready in the supine position, placed under sedation with a spinal anesthetic. At this point in time, he was prepped using Betadine. A drape was applied to create a sterile field. A 16 French flexible cystoscope was then advanced per urethra at which time a fairly significant defect within the posterior aspect of the prostatic urethra was identified. The overall prostatic contour was irregular and a false pass was noted within the posterior aspect of the prostatic urethra. I was able to ultimately identify the true lumen anterior to this and advanced the scope into the bladder. Of note, the bladder neck was somewhat tight although was able to accommodate my scope. Upon entry into the bladder, the urine was noted to be clear and visualization was good. A Super Stiff wire was then advanced into the bladder under direct visualization. The scope was then removed leaving the wire place. A 16 French  council tip catheter was then advanced over the wire into the bladder. Again, into the prostatic urethra, there was some buckling near the bladder neck secondary to narrowing which is consistent with cystoscopic findings. A Foley, the catheter was able to be advanced with return of clear yellow urine. The balloon was filled with 10 cc of sterile water.  At this point in time, please see Dr. Ammie Ferrier no further remainder of the procedure. I did discuss the findings with Dr. Sabra Heck and have recommended maintaining Foley catheter for 5 days postoperatively. I will arrange for voiding trial on Monday if he is discharged prior to that.  Hollice Espy, M.D.

## 2016-07-27 NOTE — Evaluation (Signed)
Physical Therapy Evaluation Patient Details Name: Cameron Foster. MRN: [id number] DOB: 09-Jul-1940 Today's Date: 07/27/2016   History of Present Illness  Pt admitted for R TKR. Pt with history of prostate cancer, alcoholism, depression, and GERD.   Clinical Impression  Pt is a pleasant 76 year old male who was admitted for R TKR. Pt performs bed mobility, transfers, and ambulation with cga and RW. All mobility performed with KI and TENS unit donned. Pt demonstrates deficits with strength/mobility/ROM/mobility. Pt is very motivated to perform therapy. Would benefit from skilled PT to address above deficits and promote optimal return to PLOF. Recommend transition to [place] upon discharge from acute hospitalization.       Follow Up Recommendations Home health PT    Equipment Recommendations  3in1 (PT)    Recommendations for Other Services       Precautions / Restrictions Precautions Precautions: Fall;Knee Precaution Booklet Issued: No Required Braces or Orthoses: Knee Immobilizer - Right Knee Immobilizer - Right: On at all times Restrictions Weight Bearing Restrictions: Yes RLE Weight Bearing: Partial weight bearing      Mobility  Bed Mobility Overal bed mobility: Needs Assistance Bed Mobility: Supine to Sit     Supine to sit: Min guard     General bed mobility comments: Pt only required slight assist for trunk mobility. Safe technique and ease of transfer noted. KI donned prior to all mobility  Transfers Overall transfer level: Needs assistance Equipment used: Rolling walker (2 wheeled) Transfers: Sit to/from Stand Sit to Stand: Min guard         General transfer comment: safe technique performed with upright posture. RW used with limited WBing through R LE secondary to pain. Able to maintain correct WB status  Ambulation/Gait Ambulation/Gait assistance: Min guard Ambulation Distance (Feet): 5 Feet Assistive device: Rolling walker (2 wheeled) Gait  Pattern/deviations: Step-to pattern     General Gait Details: pt able to ambulate with safe technique to recliner. Able to follow commands, however noted decreased stance time on R LE secondary to pain. SLight dizziness noted, however resolves quickly  Science writer    Modified Rankin (Stroke Patients Only)       Balance Overall balance assessment: Needs assistance Sitting-balance support: Feet supported Sitting balance-Leahy Scale: Good     Standing balance support: Bilateral upper extremity supported Standing balance-Leahy Scale: Good                               Pertinent Vitals/Pain Pain Assessment: 0-10 Pain Score: 7  Pain Location: right knee Pain Descriptors / Indicators: Operative site guarding Pain Intervention(s): Limited activity within patient's tolerance;Premedicated before session;Ice applied    Home Living Family/patient expects to be discharged to:: Private residence Living Arrangements: Spouse/significant other Available Help at Discharge: Family;Available 24 hours/day Type of Home: House Home Access: Level entry     Home Layout: One level Home Equipment: Walker - 2 wheels;Cane - single point      Prior Function Level of Independence: Independent               Hand Dominance        Extremity/Trunk Assessment   Upper Extremity Assessment Upper Extremity Assessment: Overall WFL for tasks assessed    Lower Extremity Assessment Lower Extremity Assessment: Generalized weakness (R LE grossly 3+/5; L LE grossly 5/5)       Communication  Communication: No difficulties  Cognition Arousal/Alertness: Lethargic Behavior During Therapy: WFL for tasks assessed/performed Overall Cognitive Status: Within Functional Limits for tasks assessed                                        General Comments      Exercises Total Joint Exercises Goniometric ROM: R knee AAROM: 3-55 degrees  grossly Other Exercises Other Exercises: Supine ther-ex performed on R LE including ankle pumps, quad sets, SLRs, and hip abd/add. All ther-ex performed x 10 reps with cga and cues for correct technique.   Assessment/Plan    PT Assessment Patient needs continued PT services  PT Problem List Decreased strength;Decreased range of motion;Decreased balance;Decreased mobility;Pain       PT Treatment Interventions DME instruction;Gait training;Therapeutic activities;Therapeutic exercise    PT Goals (Current goals can be found in the Care Plan section)  Acute Rehab PT Goals Patient Stated Goal: to get stronger PT Goal Formulation: With patient Time For Goal Achievement: 08/10/16 Potential to Achieve Goals: Good    Frequency BID   Barriers to discharge        Co-evaluation               End of Session Equipment Utilized During Treatment: Gait belt Activity Tolerance: Patient tolerated treatment well Patient left: in chair;with chair alarm set Nurse Communication: Mobility status PT Visit Diagnosis: Pain;Difficulty in walking, not elsewhere classified (R26.2) Pain - Right/Left: Right Pain - part of body: Knee    Time: 0677-0340 PT Time Calculation (min) (ACUTE ONLY): 31 min   Charges:   PT Evaluation $PT Eval Moderate Complexity: 1 Procedure PT Treatments $Therapeutic Exercise: 8-22 mins   PT G Codes:        Greggory Stallion, PT, DPT [phone]   Bessie Boyte 07/27/2016, 4:34 PM

## 2016-07-27 NOTE — Anesthesia Procedure Notes (Signed)
Spinal  Patient location during procedure: OR Staffing Anesthesiologist: Katy Fitch K Resident/CRNA: Rolla Plate Performed: resident/CRNA  Preanesthetic Checklist Completed: patient identified, site marked, surgical consent, pre-op evaluation, timeout performed, IV checked, risks and benefits discussed and monitors and equipment checked Spinal Block Patient position: sitting Prep: ChloraPrep and site prepped and draped Patient monitoring: heart rate, continuous pulse ox, blood pressure and cardiac monitor Approach: midline Location: L4-5 Injection technique: single-shot Needle Needle type: Introducer and Pencan  Needle gauge: 24 G Needle length: 9 cm Assessment Sensory level: T10 Additional Notes Negative paresthesia. Negative blood return. Positive free-flowing CSF. Expiration date of kit checked and confirmed. Patient tolerated procedure well, without complications.

## 2016-07-27 NOTE — H&P (Signed)
THE PATIENT WAS SEEN PRIOR TO SURGERY TODAY.  HISTORY, ALLERGIES, HOME MEDICATIONS AND OPERATIVE PROCEDURE WERE REVIEWED. RISKS AND BENEFITS OF SURGERY DISCUSSED WITH PATIENT AGAIN.  NO CHANGES FROM INITIAL HISTORY AND PHYSICAL NOTED.    

## 2016-07-27 NOTE — Anesthesia Post-op Follow-up Note (Cosign Needed)
Anesthesia QCDR form completed.        

## 2016-07-27 NOTE — Telephone Encounter (Signed)
-----   Message from Hollice Espy, MD sent at 07/27/2016  9:37 AM EDT ----- Regarding: f/u voiding trial This is a patient of ours who had a difficult Foley placement in the OR today by me.  He needs an nurse visit voiding trial on Monday. Please arrange. Please call the hospital floor to let them know about this follow-up appointment.  Hollice Espy, MD

## 2016-07-27 NOTE — Progress Notes (Signed)
This Probation officer assumed care of pt at 1530 from [place]. At 1700, pt's autovac transfusion finished and 1/2ns was rehung per md order. PT has worked with pt and is oob to recliner, intending to return to bed after dinner. Family members at bedside.

## 2016-07-27 NOTE — Transfer of Care (Signed)
Immediate Anesthesia Transfer of Care Note  Patient: Cameron Foster.  Procedure(s) Performed: Procedure(s) with comments: TOTAL KNEE ARTHROPLASTY (Right) - Dr. Erlene Quan had to place Urinary catheter due to prostate cancer history.  Using flexible scope.  Patient Location: PACU  Anesthesia Type:Spinal  Level of Consciousness: awake, alert  and oriented  Airway & Oxygen Therapy: Patient Spontanous Breathing  Post-op Assessment: Report given to RN and Post -op Vital signs reviewed and stable  Post vital signs: Reviewed  Last Vitals:  Vitals:   07/27/16 0617 07/27/16 1121  BP: 124/85 105/68  Pulse: 80 64  Resp: 20 16  Temp: 36.8 C 36.8 C    Last Pain:  Vitals:   07/27/16 0617  TempSrc: Oral         Complications: No apparent anesthesia complications

## 2016-07-27 NOTE — Progress Notes (Signed)
Pt's autovac infusion #1 finished at 1700 after 680ms was infused. Autovac #2 was taken down at 1815 and had 4519m drainage, this was hung for transfusion at 1820 and third autovac was attached to patient at 1820. Pt has returned to bed from the chair. Evening norvasc was held due to sbp of 105, pt is not symptomatic with that bp. Pt ate regular diet for dinner. Pt has knee immobilizer on and tens unit in place to r knee, teds on bilat. Call bell in reach.

## 2016-07-27 NOTE — H&P (Signed)
PREOPERATIVE H&P  Chief Complaint: M17.11  primary osteoarthritis of right knee  HPI: Cameron Foster. is a 76 y.o. male who presents for preoperative history and physical with a diagnosis of M17.11 rightl primary osteoarthritis of knee. Symptoms are rated as moderate to severe, and have been worsening.  This is significantly impairing activities of daily living.  He has elected for surgical management.   Past Medical History:  Diagnosis Date  . Alcoholism ([place])   . Arrhythmia   . Arthritis   . Asthma   . Atrial fibrillation ([place])    one episode  . Colon polyp   . Depression   . Diverticulitis   . Diverticulosis 30 years  . Fainting   . GERD (gastroesophageal reflux disease)   . History of blood clots   . Hyperlipidemia   . Hypertension   . Irritable bowel syndrome   . Prostate cancer ([place]) 2012   treated with radiation therapy.  . Vasovagal syncope   . Vertigo    Past Surgical History:  Procedure Laterality Date  . CARDIAC CATHETERIZATION     [place] no stents  . CATARACT EXTRACTION, BILATERAL    . COLONOSCOPY  2016  . COLONOSCOPY WITH PROPOFOL N/A 05/30/2016   Procedure: COLONOSCOPY WITH PROPOFOL;  Surgeon: Lollie Sails, MD;  Location[facility]COPY;  Service: Endoscopy;  Laterality: N/A;  . ESOPHAGOGASTRODUODENOSCOPY    . ESOPHAGOGASTRODUODENOSCOPY N/A 05/30/2016   Procedure: ESOPHAGOGASTRODUODENOSCOPY (EGD);  Surgeon: Lollie Sails, MD;  Location[facility] ENDOSCOPY;  Service: Endoscopy;  Laterality: N/A;  . FRACTURE SURGERY Bilateral    right arm and left wrist  . KNEE ARTHROSCOPY    . PROSTATE SURGERY     Microwave therapy  . TONSILLECTOMY     Social History   Social History  . Marital status: Married    Spouse name: N/A  . Number of children: N/A  . Years of education: N/A   Social History Main Topics  . Smoking status: Former Smoker    Years: 27.00    Quit date: 04/18/1988  . Smokeless tobacco: Current User    Types: Chew  . Alcohol use  0.6 oz/week    1 Standard drinks or equivalent per week     Comment: former.  occ.  . Drug use: No  . Sexual activity: Not Asked   Other Topics Concern  . None   Social History Narrative  . None   Family History  Problem Relation Age of Onset  . Lung cancer Father   . Bipolar disorder Son   . Sudden death Son     due to drug over dose and cardiac issues  . Prostate cancer Neg Hx   . Bladder Cancer Neg Hx    Allergies  Allergen Reactions  . Formaldehyde Rash and Other (See Comments)   Prior to Admission medications   Medication Sig Start Date End Date Taking? Authorizing Provider  amLODipine (NORVASC) 5 MG tablet Take 1 tablet (5 mg total) by mouth daily. Patient taking differently: Take 5 mg by mouth every evening.  02/10/16  Yes Leone Haven, MD  lisinopril (PRINIVIL,ZESTRIL) 10 MG tablet Take 1 tablet (10 mg total) by mouth daily. 02/10/16  Yes Leone Haven, MD  metoprolol succinate (TOPROL-XL) 25 MG 24 hr tablet Take 1 tablet (25 mg total) by mouth daily. 02/10/16  Yes Leone Haven, MD  Multiple Vitamin (MULTIVITAMIN) tablet Take 1 tablet by mouth daily.   Yes Historical Provider, MD  omeprazole ([place])  40 MG capsule TAKE ONE CAPSULE BY MOUTH ONCE DAILY 04/13/16  Yes Leone Haven, MD  oxyCODONE (ROXICODONE) 5 MG immediate release tablet Take 1 tablet (5 mg total) by mouth every 8 (eight) hours as needed for moderate pain. 06/24/16 06/24/17 Yes Leone Haven, MD  pantoprazole (PROTONIX) 40 MG tablet Take by mouth. 07/14/16 07/14/17 Yes Historical Provider, MD  polyethylene glycol powder (GLYCOLAX/MIRALAX) powder Take 17 g by mouth 2 (two) times daily as needed. Patient taking differently: Take 17 g by mouth daily.  08/03/15  Yes Leone Haven, MD  sertraline (ZOLOFT) 50 MG tablet Take 1 tablet (50 mg total) by mouth daily. Patient taking differently: Take 50 mg by mouth at bedtime.  02/10/16  Yes Leone Haven, MD  sucralfate (CARAFATE) 1 g tablet   07/01/16  Yes Historical Provider, MD     Positive ROS: All other systems have been reviewed and were otherwise negative with the exception of those mentioned in the HPI and as above.  Physical Exam: General: Alert, no acute distress Cardiovascular: No pedal edema. Heart is regular and without murmur.  Respiratory: No cyanosis, no use of accessory musculature. Lungs are clear. GI: No organomegaly, abdomen is soft and non-tender Skin: No lesions in the area of chief complaint Neurologic: Sensation intact distally Psychiatric: Patient is competent for consent with normal mood and affect Lymphatic: No axillary or cervical lymphadenopathy  MUSCULOSKELETAL: Right knee rom 10-95*.  Severe varus.  Joint lines tender..  CSM good distally.    Assessment: M17.11 right  primary osteoarthritis of knee  Plan: Plan for Procedure(s): TOTAL KNEE ARTHROPLASTY  The risks benefits and alternatives were discussed with the patient including but not limited to the risks of nonoperative treatment, versus surgical intervention including infection, bleeding, nerve injury,  blood clots, cardiopulmonary complications, morbidity, mortality, among others, and they were willing to proceed.   Park Breed, MD [phone]   07/27/2016 7:54 AM

## 2016-07-27 NOTE — Progress Notes (Signed)
ADMISSION NOTE:  Pt arrived to room 158 from PACU. Pt alert and oriented. No complaints of pain at this time. Pt has TENS unit at the bedside. Pt oriented to call bell and room. Skin assessment  completed with York Cerise, RN.

## 2016-07-27 NOTE — Telephone Encounter (Signed)
Spoke with Judeen Hammans and gave her the appt information  Sharyn Lull

## 2016-07-28 ENCOUNTER — Encounter: Payer: Self-pay | Admitting: Specialist

## 2016-07-28 LAB — BASIC METABOLIC PANEL
ANION GAP: 5 (ref 5–15)
BUN: 14 mg/dL (ref 6–20)
CO2: 25 mmol/L (ref 22–32)
Calcium: 8.2 mg/dL — ABNORMAL LOW (ref 8.9–10.3)
Chloride: 103 mmol/L (ref 101–111)
Creatinine, Ser: 1.39 mg/dL — ABNORMAL HIGH (ref 0.61–1.24)
GFR, EST AFRICAN AMERICAN: 56 mL/min — AB (ref >60)
GFR, EST NON AFRICAN AMERICAN: 48 mL/min — AB (ref >60)
Glucose, Bld: 98 mg/dL (ref 65–99)
POTASSIUM: 3.4 mmol/L — AB (ref 3.5–5.1)
SODIUM: 133 mmol/L — AB (ref 135–145)

## 2016-07-28 LAB — CBC
HCT: 30.4 % — ABNORMAL LOW (ref 40.0–52.0)
Hemoglobin: 10.2 g/dL — ABNORMAL LOW (ref 13.0–18.0)
MCH: 28.7 pg (ref 26.0–34.0)
MCHC: 33.5 g/dL (ref 32.0–36.0)
MCV: 85.8 fL (ref 80.0–100.0)
PLATELETS: 185 10*3/uL (ref 150–440)
RBC: 3.54 MIL/uL — AB (ref 4.40–5.90)
RDW: 14 % (ref 11.5–14.5)
WBC: 6.5 10*3/uL (ref 3.8–10.6)

## 2016-07-28 NOTE — Progress Notes (Signed)
Shift assessment completed at 0730.Pt alert and oriented at that time, in no distress, stated he has had intermittent pain to his r knee, received routine tylenol po, discussed with pt that muscle spasms may be occurring. Pt is on room air, lungs clear bilat, Hr is regular, abdomen is soft, bs heard. piv #20 intact to L fa with iv 1/2ns infusing at 64ms/hr, site is free of redness and swelling. Foley catheter  Is draining clear urine, light yellow. R knee has immobilizer, tens unit, and polar care in place. R leg is edematous in comparison to l leg, all the way to the toes. Pt stated that his r leg is often edematous at home and that he wears teds at home. Teds are on bilat. Ppp. hemovac drain intact to r knee was leaking, this writer extracted a clot from the tubing and hemovac was then able to drain. Pt's sbp was 105 this am, lisinopril was held by this wProbation officer Pt has worked with physical therapy, reportedly became dizzy with some ambulation so this was aborted and pt is now oob to recliner. Urine to catheter noted to be light pink post physical therapy.

## 2016-07-28 NOTE — Progress Notes (Signed)
Clinical Social Worker (CSW) received SNF consult. PT is recommending home health. RN case manager aware of above. Please reconsult if future social work needs arise. CSW signing off.   Mcgregor Tinnon, LCSW [phone] 

## 2016-07-28 NOTE — NC FL2 (Signed)
[place] LEVEL OF CARE SCREENING TOOL     IDENTIFICATION  Patient Name: Cameron Foster. Birthdate: 03/05/1941 Sex: male Admission Date (Current Location): 07/27/2016  [place] and [place] Number:  Engineering geologist and Address:  [facility], [address] [place], [place] [address]      Provider Number: [id number]  Attending Physician Name and Address:  [name], MD  Relative Name and Phone Number:       Current Level of Care: Hospital Recommended Level of Care: [place] Prior Approval Number:    Date Approved/Denied:   PASRR Number:  [id number])  Discharge Plan: SNF    Current Diagnoses: Patient Active Problem List   Diagnosis Date Noted  . Total knee replacement status 07/27/2016  . Rib fracture 06/24/2016  . Preoperative clearance 05/16/2016  . Visit for wound check 05/16/2016  . Vasovagal syncope 01/27/2016  . Diverticulitis large intestine 01/18/2016  . Abdominal bloating 01/01/2016  . Rash and nonspecific skin eruption 10/28/2015  . Actinic keratoses 10/28/2015  . Osteoarthritis of right knee 10/01/2015  . Essential hypertension 08/25/2015  . Chronic abdominal pain 08/25/2015  . Anxiety 08/25/2015  . Chronic sinus infection 08/12/2015  . Exertional dyspnea 08/12/2015  . Right leg numbness 08/12/2015  . Gross hematuria 08/04/2015  . Erectile dysfunction following radiation therapy 08/04/2015  . Urinary retention 08/03/2015  . Myalgia 08/02/2015  . Constipation 08/02/2015  . History of prostate cancer 01/20/2015  . Skin nodule 01/20/2015  . Grief reaction 01/20/2015  . Swelling of right lower extremity 01/20/2015    Orientation RESPIRATION BLADDER Height & Weight     Time, Situation, Place, Self  Normal Incontinent, Indwelling catheter Weight: 204 lb (92.5 kg) Height:  5' 9"  (175.3 cm)  BEHAVIORAL SYMPTOMS/MOOD NEUROLOGICAL BOWEL NUTRITION STATUS   (None.)  (None.) Incontinent  Diet (Diet: Regular)  AMBULATORY STATUS COMMUNICATION OF NEEDS Skin   Extensive Assist Verbally Surgical wounds (Incision: Right Knee)                       Personal Care Assistance Level of Assistance  Feeding, Dressing, Bathing Bathing Assistance: Limited assistance Feeding assistance: Independent Dressing Assistance: Limited assistance     Functional Limitations Info  Sight, Hearing, Speech Sight Info: Adequate Hearing Info: Adequate Speech Info: Adequate    SPECIAL CARE FACTORS FREQUENCY  PT (By licensed PT), OT (By licensed OT)     PT Frequency:  (5) OT Frequency:  (5)            Contractures      Additional Factors Info  Code Status, Allergies Code Status Info:  (Full Code) Allergies Info:  (Formaldehyde)           Current Medications (07/28/2016):  This is the current hospital active medication list Current Facility-Administered Medications  Medication Dose Route Frequency Provider Last Rate Last Dose  . 0.45 % sodium chloride infusion   Intravenous Continuous [name], MD 75 mL/hr at 07/28/16 0452    . acetaminophen (TYLENOL) tablet 650 mg  650 mg Oral Q6H PRN [name], MD       Or  . acetaminophen (TYLENOL) suppository 650 mg  650 mg Rectal Q6H PRN [name], MD      . alum & mag hydroxide-simeth (MAALOX/MYLANTA) 200-200-20 MG/5ML suspension 30 mL  30 mL Oral Q4H PRN [name], MD      . amLODipine (NORVASC) tablet 5 mg  5 mg Oral QPM Nadara Mustard  Sabra Heck, MD      . bisacodyl (DULCOLAX) suppository 10 mg  10 mg Rectal Daily PRN [name], MD      . celecoxib (CELEBREX) capsule 200 mg  200 mg Oral Q12H [name], MD   200 mg at 07/27/16 2153  . diphenhydrAMINE (BENADRYL) 12.5 MG/5ML elixir 12.5-25 mg  12.5-25 mg Oral Q4H PRN [name], MD      . ferrous sulfate tablet 325 mg  325 mg Oral TID PC [name], MD   325 mg at 07/27/16 1824  . lisinopril (PRINIVIL,ZESTRIL) tablet 10 mg  10 mg Oral Daily [name], MD      .  magnesium hydroxide (MILK OF MAGNESIA) suspension 30 mL  30 mL Oral Daily PRN [name], MD      . menthol-cetylpyridinium (CEPACOL) lozenge 3 mg  1 lozenge Oral PRN [name], MD       Or  . phenol (CHLORASEPTIC) mouth spray 1 spray  1 spray Mouth/Throat PRN [name], MD      . methocarbamol (ROBAXIN) tablet 500 mg  500 mg Oral Q6H PRN [name], MD   500 mg at 07/28/16 0057   Or  . methocarbamol (ROBAXIN) 500 mg in dextrose 5 % 50 mL IVPB  500 mg Intravenous Q6H PRN [name], MD      . metoCLOPramide (REGLAN) tablet 5-10 mg  5-10 mg Oral Q8H PRN [name], MD       Or  . metoCLOPramide (REGLAN) injection 5-10 mg  5-10 mg Intravenous Q8H PRN [name], MD      . metoprolol succinate (TOPROL-XL) 24 hr tablet 25 mg  25 mg Oral Daily [name], MD      . morphine 2 MG/ML injection 1 mg  1 mg Intravenous Q2H PRN [name], MD      . multivitamin with minerals tablet 1 tablet  1 tablet Oral Daily [name], MD      . ondansetron [facility]) tablet 4 mg  4 mg Oral Q6H PRN [name], MD       Or  . ondansetron [facility]) injection 4 mg  4 mg Intravenous Q6H PRN [name], MD      . oxyCODONE (Oxy IR/ROXICODONE) immediate release tablet 5 mg  5 mg Oral Q8H PRN [name], MD   5 mg at 07/28/16 0441  . pantoprazole (PROTONIX) EC tablet 40 mg  40 mg Oral Daily [name], MD   40 mg at 07/27/16 1543  . polyethylene glycol (MIRALAX / GLYCOLAX) packet 17 g  17 g Oral Daily [name], MD   17 g at 07/27/16 1430  . pregabalin (LYRICA) capsule 75 mg  75 mg Oral BID [name], MD   75 mg at 07/27/16 2153  . rivaroxaban (XARELTO) tablet 10 mg  10 mg Oral Q breakfast [name], MD   10 mg at 07/28/16 0756  . senna (SENOKOT) tablet 8.6 mg  1 tablet Oral BID [name], MD   8.6 mg at 07/27/16 2152  . sertraline (ZOLOFT) tablet 50 mg  50 mg Oral QHS [name], MD   50 mg at 07/27/16 2152  . sodium phosphate (FLEET) 7-19 GM/118ML enema 1 enema   1 enema Rectal Once PRN [name], MD      . sucralfate (CARAFATE) tablet 1 g  1 g Oral BID [name], MD   1 g at 07/27/16 2152  . zolpidem (AMBIEN) tablet 5 mg  5 mg Oral QHS  PRN [name], MD         Discharge Medications: Please see discharge summary for a list of discharge medications.  Relevant Imaging Results:  Relevant Lab Results:   Additional Information  (SSN: [id number])  Danie Chandler, Student-Social Work

## 2016-07-28 NOTE — Evaluation (Signed)
Occupational Therapy Evaluation Patient Details Name: Cameron Foster. MRN: [id number] DOB: [birth year] Today's Date: 07/28/2016    History of Present Illness Pt admitted for R TKR. Pt with history of prostate cancer, alcoholism, depression, and GERD. Pt also reports hx of vertigo (treated by ENT) and vasovagal syncope.   Clinical Impression   Pt seen for OT evaluation this date. Pt pleasant, agreeable to OT session. POD#1 for R TKR. Pt performed bed mobility, transfer from EOB, and ambulation with RW with min guard, requiring min assist for sit>stand from regular height toilet with minimal verbal cues for hand placement during transfers to maximize safety. OT placed BSC over toilet in bathroom to improve independence and safety with toilet transfers, recommending BSC for home set up initially. Pt able to maintain weightbearing precautions throughout session. Pt presents with pain in R knee, slight dizziness at end of session which subsided quickly once seated EOB, decreased strength/ROM/activity tolerance, and need for skilled OT services to address noted impairments and functional deficits in order to maximize functional independence and return to PLOF.     Follow Up Recommendations  Home health OT    Equipment Recommendations  3 in 1 bedside commode    Recommendations for Other Services       Precautions / Restrictions Precautions Precautions: Fall;Knee Precaution Booklet Issued: No Required Braces or Orthoses: Knee Immobilizer - Right Knee Immobilizer - Right: On at all times Restrictions Weight Bearing Restrictions: Yes RLE Weight Bearing: Partial weight bearing      Mobility Bed Mobility Overal bed mobility: Needs Assistance Bed Mobility: Supine to Sit;Sit to Supine     Supine to sit: Min guard;HOB elevated Sit to supine: Min guard      Transfers Overall transfer level: Needs assistance Equipment used: Rolling walker (2 wheeled) Transfers: Sit to/from Stand Sit  to Stand: Min guard;Min assist         General transfer comment: min assist from regular height toilet, min guard from EOB, verbal cues for hand placement to maximize safety    Balance Overall balance assessment: Needs assistance Sitting-balance support: Feet supported Sitting balance-Leahy Scale: Good     Standing balance support: Bilateral upper extremity supported Standing balance-Leahy Scale: Good                             ADL either performed or assessed with clinical judgement   ADL Overall ADL's : Needs assistance/impaired Eating/Feeding: Sitting;Set up   Grooming: Standing;Min guard;Wash/dry hands;Wash/dry face;Oral care Grooming Details (indicate cue type and reason): standing for short periods of time (<3 min)         Upper Body Dressing : Set up;Sitting   Lower Body Dressing: Minimal assistance;Sitting/lateral leans;Cueing for compensatory techniques;With adaptive equipment Lower Body Dressing Details (indicate cue type and reason): pt educated in use of AE for LB ADL with pt verbalizing understanding, declined to trial this session Toilet Transfer: Minimal assistance;Regular Toilet;Grab bars;Cueing for safety Toilet Transfer Details (indicate cue type and reason): verbal cues for hand placement to maximize safety; placed BSC over toilet in bathroom to improve independence Toileting- Clothing Manipulation and Hygiene: Min guard;Sit to/from stand       Functional mobility during ADLs: Min guard;Rolling walker General ADL Comments: pt generally min assist for LB ADL, verbal cues to improve safety during functional transfers     Vision Baseline Vision/History: Wears glasses Wears Glasses: Reading only Patient Visual Report: No change from baseline Vision Assessment?: No  apparent visual deficits     Perception     Praxis Praxis Praxis tested?: Within functional limits    Pertinent Vitals/Pain Pain Assessment: 0-10 Pain Score: 2  Pain  Location: right knee  Pain Descriptors / Indicators: Operative site guarding;Shooting Pain Intervention(s): Limited activity within patient's tolerance;Monitored during session;Ice applied;Repositioned     Hand Dominance     Extremity/Trunk Assessment Upper Extremity Assessment Upper Extremity Assessment: Overall WFL for tasks assessed   Lower Extremity Assessment Lower Extremity Assessment: Defer to PT evaluation;RLE deficits/detail   Cervical / Trunk Assessment Cervical / Trunk Assessment: Normal   Communication Communication Communication: No difficulties   Cognition Arousal/Alertness: Awake/alert Behavior During Therapy: WFL for tasks assessed/performed Overall Cognitive Status: Within Functional Limits for tasks assessed                                     General Comments       Exercises     Shoulder Instructions      Home Living Family/patient expects to be discharged to:: Private residence Living Arrangements: Spouse/significant other Available Help at Discharge: Family;Available 24 hours/day Type of Home: House Home Access: Level entry     Home Layout: One level     Bathroom Shower/Tub: Occupational psychologist: Standard Bathroom Accessibility: Yes How Accessible: Accessible via walker Home Equipment: [place] - 2 wheels;Cane - single point;Hand held shower head          Prior Functioning/Environment Level of Independence: Independent        Comments: Pt reports indep at baseline with ADL, IADL including driving, stays busy and enjoys golfing and fishing which he hopes to get back to once healed. Pt/spouse moved down to [place] from [place], [place] approx 1 yr ago.        OT Problem List: Decreased strength;Pain;Decreased range of motion;Decreased activity tolerance;Decreased safety awareness;Decreased knowledge of use of DME or AE      OT Treatment/Interventions: Self-care/ADL training;Energy conservation;DME and/or AE  instruction;Patient/family education    OT Goals(Current goals can be found in the care plan section) Acute Rehab OT Goals Patient Stated Goal: to get stronger OT Goal Formulation: With patient Time For Goal Achievement: 08/11/16 Potential to Achieve Goals: Good  OT Frequency: Min 1X/week   Barriers to D/C:            Co-evaluation              End of Session Equipment Utilized During Treatment: Gait belt;Rolling walker;Right knee immobilizer  Activity Tolerance: Patient tolerated treatment well Patient left: in bed;with call bell/phone within reach;with SCD's reapplied;Other (comment) (polar care in place)  OT Visit Diagnosis: Other abnormalities of gait and mobility (R26.89);History of falling (Z91.81);Pain;Muscle weakness (generalized) (M62.81) Pain - Right/Left: Right Pain - part of body: Knee                Time: 0092-3300 OT Time Calculation (min): 44 min Charges:  OT General Charges $OT Visit: 1 Procedure OT Evaluation $OT Eval Low Complexity: 1 Procedure OT Treatments $Self Care/Home Management : 23-37 mins G-Codes:     Jeni Salles, MPH, MS, OTR/L ascom [phone] 07/28/16, 10:10 AM

## 2016-07-28 NOTE — Progress Notes (Signed)
Physical Therapy Treatment Patient Details Name: Cameron Foster. MRN: [id number] DOB: [birth year] Today's Date: 07/28/2016    History of Present Illness Pt admitted for R TKR. Pt with history of prostate cancer, alcoholism, depression, and GERD. Pt also reports hx of vertigo (treated by ENT) and vasovagal syncope.    PT Comments    Pt is making good progress towards goals with increased tolerance for there-ex this date. Pt does report dizziness during ambulation, limiting distance at this time. Able to ambulate towards door with +2 for safety. Needs chair follow for safety. Good progress with AAROM. Pt appears motivated to perform therapy. Educated don/doffing of KI and use of TENS unit. Foley intact. Will continue to progress.    Follow Up Recommendations  Home health PT     Equipment Recommendations  3in1 (PT)    Recommendations for Other Services       Precautions / Restrictions Precautions Precautions: Fall;Knee Precaution Booklet Issued: No Required Braces or Orthoses: Knee Immobilizer - Right Knee Immobilizer - Right: On at all times Restrictions Weight Bearing Restrictions: Yes RLE Weight Bearing: Partial weight bearing    Mobility  Bed Mobility Overal bed mobility: Needs Assistance Bed Mobility: Supine to Sit     Supine to sit: Min guard Sit to supine: Min guard   General bed mobility comments: safe technique performed with decreased assist required for trunk mobility. KI donned prior to mobility attempts  Transfers Overall transfer level: Needs assistance Equipment used: Rolling walker (2 wheeled) Transfers: Sit to/from Stand Sit to Stand: Min guard         General transfer comment: Safe technique performed with pt able to push from seated surface. UPright posture noted with RW. Pt complaints of dizziness with upright posture  Ambulation/Gait Ambulation/Gait assistance: Min guard Ambulation Distance (Feet): 15 Feet Assistive device: Rolling walker  (2 wheeled) Gait Pattern/deviations: Step-to pattern     General Gait Details: Pt ambulated to door, then became dizzy, needed seated rest break. Dizziness subsides once seated. 2nd attempt for ambulation with chair follow. Pt ambulated 5' additional and needed seated rest break again. FUther distance deferred.   Stairs            Wheelchair Mobility    Modified Rankin (Stroke Patients Only)       Balance Overall balance assessment: Needs assistance Sitting-balance support: Feet supported Sitting balance-Leahy Scale: Good     Standing balance support: Bilateral upper extremity supported Standing balance-Leahy Scale: Good                              Cognition Arousal/Alertness: Awake/alert Behavior During Therapy: WFL for tasks assessed/performed Overall Cognitive Status: Within Functional Limits for tasks assessed                                        Exercises Total Joint Exercises Goniometric ROM: R knee AAROM: 4-67 degrees Other Exercises Other Exercises: Supinet ther0ex performed on R LE including ankle pumps, quad sets, SLRs, hip abd/add, and SAQ. Pt also performed heel slides x 5 reps. ALl ther-ex performed x 12 reps with cga.    General Comments        Pertinent Vitals/Pain Pain Assessment: 0-10 Pain Score: 5  Pain Location: right knee  Pain Descriptors / Indicators: Operative site guarding;Shooting Pain Intervention(s): Limited activity within patient's tolerance  Home Living Family/patient expects to be discharged to:: Private residence Living Arrangements: Spouse/significant other Available Help at Discharge: Family;Available 24 hours/day Type of Home: House Home Access: Level entry   Home Layout: One level Home Equipment: Walker - 2 wheels;Cane - single point;Hand held shower head      Prior Function Level of Independence: Independent      Comments: Pt reports indep at baseline with ADL, IADL including  driving, stays busy and enjoys golfing and fishing which he hopes to get back to once healed. Pt/spouse moved down to [place] from [place], [place] approx 1 yr ago.   PT Goals (current goals can now be found in the care plan section) Acute Rehab PT Goals Patient Stated Goal: to get stronger PT Goal Formulation: With patient Time For Goal Achievement: 08/10/16 Potential to Achieve Goals: Good Progress towards PT goals: Progressing toward goals    Frequency    BID      PT Plan Current plan remains appropriate    Co-evaluation             End of Session Equipment Utilized During Treatment: Gait belt Activity Tolerance: Patient tolerated treatment well Patient left: in chair;with chair alarm set Nurse Communication: Mobility status PT Visit Diagnosis: Pain;Difficulty in walking, not elsewhere classified (R26.2) Pain - Right/Left: Right Pain - part of body: Knee     Time: 2353-6144 PT Time Calculation (min) (ACUTE ONLY): 43 min  Charges:  $Gait Training: 23-37 mins $Therapeutic Exercise: 8-22 mins                    G Codes:       Cameron Foster, PT, DPT [phone]    Cameron Foster 07/28/2016, 11:35 AM

## 2016-07-28 NOTE — Progress Notes (Signed)
Subjective: 1 Day Post-Op Procedure(s) (LRB): TOTAL KNEE ARTHROPLASTY (Right)    Patient reports pain as mild.  Objective:   VITALS:   Vitals:   07/28/16 0739 07/28/16 1207  BP: 101/65 102/68  Pulse: 64 60  Resp: 16   Temp: 98.4 F (36.9 C) 98 F (36.7 C)    Neurologically intact ABD soft Neurovascular intact Sensation intact distally Intact pulses distally Dorsiflexion/Plantar flexion intact  LABS  Recent Labs  07/28/16 0523  HGB 10.2*  HCT 30.4*  WBC 6.5  PLT 185     Recent Labs  07/28/16 0523  NA 133*  K 3.4*  BUN 14  CREATININE 1.39*  GLUCOSE 98    No results for input(s): LABPT, INR in the last 72 hours.   Assessment/Plan: 1 Day Post-Op Procedure(s) (LRB): TOTAL KNEE ARTHROPLASTY (Right)   Advance diet Up with therapy D/C IV fluids Plan for discharge tomorrow or next day.   Has OPPT scheduled for next week.   Will not need HHPT

## 2016-07-28 NOTE — Plan of Care (Signed)
Problem: Skin Integrity: Goal: Signs of wound healing will improve Outcome: Progressing Pt is progressing toward goals.

## 2016-07-28 NOTE — Anesthesia Postprocedure Evaluation (Signed)
Anesthesia Post Note  Patient: Azion Centrella.  Procedure(s) Performed: Procedure(s) (LRB): TOTAL KNEE ARTHROPLASTY (Right)  Patient location during evaluation: Nursing Unit Anesthesia Type: Spinal Level of consciousness: awake, oriented and awake and alert Pain management: pain level controlled Vital Signs Assessment: post-procedure vital signs reviewed and stable Respiratory status: spontaneous breathing, nonlabored ventilation and respiratory function stable Cardiovascular status: blood pressure returned to baseline and stable Postop Assessment: no headache and no backache Anesthetic complications: no     Last Vitals:  Vitals:   07/28/16 0100 07/28/16 0739  BP: 104/64 101/65  Pulse: 64 64  Resp: 18 16  Temp: 36.9 C 36.9 C    Last Pain:  Vitals:   07/28/16 0739  TempSrc: Oral  PainSc:                  Johnna Acosta

## 2016-07-28 NOTE — Progress Notes (Signed)
Physical Therapy Treatment Patient Details Name: Cameron Foster. MRN: [id number] DOB: 07/09/1940 Today's Date: 07/28/2016    History of Present Illness Pt admitted for R TKR. Pt with history of prostate cancer, alcoholism, depression, and GERD. Pt also reports hx of vertigo (treated by ENT) and vasovagal syncope.    PT Comments    Pt agreeable to PT; reports 6/10 pain in R knee. Pt performs extension and flexion stretching with education on carryover to home. KI donned for ambulation; pt has urgent need for use of bathroom; pt ambulates 5 steps to bedside commode. During toileting, pt feels he will be ill, but no actual emesis. Pt feels shaky/weak post toileting, as pt has had low blood pressure today. Pt ambulates short distance with Min guard/Min A bedside commode to bed with Min A for sit and return to bed. Shaky/weak feeling subsides once returned to bed. Continue PT to progress range R knee, strength and endurance as able to improve functional mobility.   Follow Up Recommendations  Home health PT     Equipment Recommendations  3in1 (PT)    Recommendations for Other Services       Precautions / Restrictions Precautions Precautions: Fall;Knee Precaution Booklet Issued: No Required Braces or Orthoses: Knee Immobilizer - Right Knee Immobilizer - Right: On at all times Restrictions Weight Bearing Restrictions: Yes RLE Weight Bearing: Partial weight bearing    Mobility  Bed Mobility Overal bed mobility: Needs Assistance Bed Mobility: Sit to Supine       Sit to supine: Min assist   General bed mobility comments: Assist for LEs; repositions upward in bed independently  Transfers Overall transfer level: Needs assistance Equipment used: Rolling walker (2 wheeled) Transfers: Sit to/from Stand Sit to Stand: Min guard;Min assist (Min assist second time up due to weak/shakey feeling)            Ambulation/Gait Ambulation/Gait assistance: Min guard;Min assist (Min A  second  tim up due to feeling shakey) Ambulation Distance (Feet): 5 Feet (2x; chair to [facility] then BSC to bed) Assistive device: Rolling walker (2 wheeled) Gait Pattern/deviations: Step-to pattern Gait velocity: reduced Gait velocity interpretation: <1.8 ft/sec, indicative of risk for recurrent falls General Gait Details: KI donned chair to [facility]; felt nauseated while toileting without actual emesis; Shakey after toileting so returned to bed. Deferred further ambulation   Stairs            Wheelchair Mobility    Modified Rankin (Stroke Patients Only)       Balance Overall balance assessment: Needs assistance Sitting-balance support: Feet supported Sitting balance-Leahy Scale: Good     Standing balance support: Bilateral upper extremity supported Standing balance-Leahy Scale: Fair                              Cognition Arousal/Alertness: Awake/alert Behavior During Therapy: WFL for tasks assessed/performed Overall Cognitive Status: Within Functional Limits for tasks assessed                                        Exercises Total Joint Exercises Quad Sets: Strengthening;Both;20 reps Knee Flexion: AROM;Right;10 reps;Seated;Other (comment) (3 positions each rep with 10 sec hold each)    General Comments        Pertinent Vitals/Pain Pain Assessment: 0-10 Pain Score: 6  Pain Location: right knee  Pain Descriptors / Indicators: Constant;Operative  site guarding;Aching Pain Intervention(s): Limited activity within patient's tolerance;Monitored during session;Premedicated before session;Repositioned;Ice applied    Home Living                      Prior Function            PT Goals (current goals can now be found in the care plan section) Progress towards PT goals: Progressing toward goals    Frequency    BID      PT Plan Current plan remains appropriate    Co-evaluation             End of Session Equipment  Utilized During Treatment: Gait belt Activity Tolerance: Patient tolerated treatment well;Other (comment) (weak/shakey after toileting/feeling sick) Patient left: in bed;with call bell/phone within reach;with bed alarm set;with family/visitor present;Other (comment) (polar care in place)   PT Visit Diagnosis: Pain;Difficulty in walking, not elsewhere classified (R26.2) Pain - Right/Left: Right Pain - part of body: Knee     Time: 4301-4840 PT Time Calculation (min) (ACUTE ONLY): 50 min  Charges:  $Gait Training: 8-22 mins $Therapeutic Exercise: 8-22 mins $Therapeutic Activity: 8-22 mins                    G CodesLarae Grooms, PTA 07/28/2016, 4:41 PM

## 2016-07-29 LAB — CBC
HCT: 29.6 % — ABNORMAL LOW (ref 40.0–52.0)
Hemoglobin: 10.1 g/dL — ABNORMAL LOW (ref 13.0–18.0)
MCH: 29.3 pg (ref 26.0–34.0)
MCHC: 34.1 g/dL (ref 32.0–36.0)
MCV: 86 fL (ref 80.0–100.0)
Platelets: 183 10*3/uL (ref 150–440)
RBC: 3.44 MIL/uL — AB (ref 4.40–5.90)
RDW: 13.8 % (ref 11.5–14.5)
WBC: 8.5 10*3/uL (ref 3.8–10.6)

## 2016-07-29 MED ORDER — HYDROCODONE-ACETAMINOPHEN 7.5-325 MG PO TABS: 1.0000 | ORAL_TABLET | Freq: Four times a day (QID) | ORAL | 0 refills | Status: DC | PRN

## 2016-07-29 MED ORDER — CLOPIDOGREL BISULFATE 75 MG PO TABS: 75.0000 mg | ORAL_TABLET | Freq: Every day | ORAL | 3 refills | Status: DC

## 2016-07-29 MED ORDER — MELOXICAM 15 MG PO TABS: 15.0000 mg | ORAL_TABLET | Freq: Every day | ORAL | 3 refills | Status: DC

## 2016-07-29 MED ORDER — OXYCODONE HCL 5 MG PO TABS
5.0000 mg | ORAL_TABLET | ORAL | Status: DC | PRN
  Administered 2016-07-29: 5 mg via ORAL
  Filled 2016-07-29: qty 1

## 2016-07-29 MED ORDER — GABAPENTIN 400 MG PO CAPS: 400.0000 mg | ORAL_CAPSULE | Freq: Two times a day (BID) | ORAL | 3 refills | Status: DC

## 2016-07-29 MED ORDER — SULFAMETHOXAZOLE-TRIMETHOPRIM 800-160 MG PO TABS: 1.0000 | ORAL_TABLET | Freq: Two times a day (BID) | ORAL | 3 refills | Status: DC

## 2016-07-29 NOTE — Discharge Summary (Signed)
Physician Discharge Summary  Patient ID: Cameron Foster. MRN: [id number] DOB/AGE: 12-04-1940 76 y.o.  Admit date: 07/27/2016 Discharge date: 07/29/2016  Admission Diagnoses: right knee arthritis  Discharge Diagnoses:  Active Problems:   Total knee replacement status   Discharged Condition: good  Hospital Course: Right TKR without problems.  Good progress with PT.  Ready to go home today.  To see Dr Erlene Quan Monday  Consults: urology  Significant Diagnostic Studies: radiology: X-Ray: satisfactory post op  Treatments: antibiotics: vancomycin  Discharge Exam: Blood pressure 111/61, pulse 69, temperature 97.3 F (36.3 C), temperature source Oral, resp. rate 18, height 5' 9"  (1.753 m), weight 92.5 kg (204 lb), SpO2 99 %. Incision/Wound: Benign  Disposition: 01-Home or Self Care  Discharge Instructions    Call MD for:  persistant nausea and vomiting    Complete by:  As directed    Call MD for:  redness, tenderness, or signs of infection (pain, swelling, redness, odor or green/yellow discharge around incision site)    Complete by:  As directed    Call MD for:  severe uncontrolled pain    Complete by:  As directed    Call MD for:  temperature >100.4    Complete by:  As directed    Diet - low sodium heart healthy    Complete by:  As directed    Discharge instructions    Complete by:  As directed    Partial weight on right leg See Dr Erlene Quan Monday 8:45 AM Move knee every 2-3 hours RTc 1 week   Increase activity slowly    Complete by:  As directed      Allergies as of 07/29/2016      Reactions   Formaldehyde Rash, Other (See Comments)      Medication List    TAKE these medications   amLODipine 5 MG tablet Commonly known as:  NORVASC Take 1 tablet (5 mg total) by mouth daily. What changed:  when to take this   clopidogrel 75 MG tablet Commonly known as:  PLAVIX Take 1 tablet (75 mg total) by mouth daily.   gabapentin 400 MG capsule Commonly known as:   NEURONTIN Take 1 capsule (400 mg total) by mouth 2 (two) times daily.   HYDROcodone-acetaminophen 7.5-325 MG tablet Commonly known as:  NORCO Take 1 tablet by mouth every 6 (six) hours as needed for moderate pain.   lisinopril 10 MG tablet Commonly known as:  PRINIVIL,ZESTRIL Take 1 tablet (10 mg total) by mouth daily.   meloxicam 15 MG tablet Commonly known as:  MOBIC Take 1 tablet (15 mg total) by mouth daily.   metoprolol succinate 25 MG 24 hr tablet Commonly known as:  TOPROL-XL Take 1 tablet (25 mg total) by mouth daily.   multivitamin tablet Take 1 tablet by mouth daily.   omeprazole 40 MG capsule Commonly known as:  PRILOSEC TAKE ONE CAPSULE BY MOUTH ONCE DAILY   oxyCODONE 5 MG immediate release tablet Commonly known as:  ROXICODONE Take 1 tablet (5 mg total) by mouth every 8 (eight) hours as needed for moderate pain.   pantoprazole 40 MG tablet Commonly known as:  PROTONIX Take by mouth.   polyethylene glycol powder powder Commonly known as:  GLYCOLAX/MIRALAX Take 17 g by mouth 2 (two) times daily as needed. What changed:  when to take this   sertraline 50 MG tablet Commonly known as:  ZOLOFT Take 1 tablet (50 mg total) by mouth daily. What changed:  when to take  this   sucralfate 1 g tablet Commonly known as:  CARAFATE   sulfamethoxazole-trimethoprim 800-160 MG tablet Commonly known as:  BACTRIM DS,SEPTRA DS Take 1 tablet by mouth 2 (two) times daily.            Durable Medical Equipment        Start     Ordered   07/27/16 1232  DME Walker rolling  Once    Question:  Patient needs a walker to treat with the following condition  Answer:  Total knee replacement status   07/27/16 1231     Follow-up Information    Hollice Espy, MD Follow up on 08/01/2016.   Specialty:  Urology Why:  at 8;45 , for voiding trail. Contact information: [place] [address] [phone]        Park Breed, MD On  08/05/2016.   Specialty:  Specialist Why:  at [employer] information: [place] [address] [phone]           Signed: Park Breed 07/29/2016, 1:53 PM

## 2016-07-29 NOTE — Progress Notes (Signed)
Subjective: 2 Days Post-Op Procedure(s) (LRB): TOTAL KNEE ARTHROPLASTY (Right)    Patient reports pain as mild. Doing well with PT.  Wants to go home today.  RTC 1 week.  To see Dr Erlene Quan Monday 8:45 AM  Objective:   VITALS:   Vitals:   07/28/16 2217 07/29/16 0733  BP: 129/81 111/61  Pulse: 82 69  Resp: 18 18  Temp: 98.6 F (37 C) 97.3 F (36.3 C)    Neurologically intact ABD soft Neurovascular intact Sensation intact distally Intact pulses distally Dorsiflexion/Plantar flexion intact Incision: no drainage  LABS  Recent Labs  07/28/16 0523 07/29/16 0608  HGB 10.2* 10.1*  HCT 30.4* 29.6*  WBC 6.5 8.5  PLT 185 183     Recent Labs  07/28/16 0523  NA 133*  K 3.4*  BUN 14  CREATININE 1.39*  GLUCOSE 98    No results for input(s): LABPT, INR in the last 72 hours.   Assessment/Plan: 2 Days Post-Op Procedure(s) (LRB): TOTAL KNEE ARTHROPLASTY (Right)   D/C today OPPT scheduled for Monday

## 2016-07-29 NOTE — Progress Notes (Signed)
Physical Therapy Treatment Patient Details Name: Cameron Foster. MRN: [id number] DOB: [birth year] Today's Date: 07/29/2016    History of Present Illness Pt admitted for R TKR. Pt with history of prostate cancer, alcoholism, depression, and GERD. Pt also reports hx of vertigo (treated by ENT) and vasovagal syncope.    PT Comments    Pt is making gradual progress towards goals. Pt still demonstrates decreased endurance with all mobility, needs cues for safety. Needs multiple breaks during ambulation secondary to fatigue. Still requires cues for sequencing and safety with RW. Pt continues to be motivated to participate, however still reports dizziness/weakness during ambulation. Would benefit from HHPT for safety and 24/7 for OOB mobility.  Follow Up Recommendations  Home health PT;Supervision/Assistance - 24 hour     Equipment Recommendations  3in1 (PT)    Recommendations for Other Services       Precautions / Restrictions Precautions Precautions: Fall;Knee Precaution Booklet Issued: Yes (comment) Required Braces or Orthoses: Knee Immobilizer - Right Knee Immobilizer - Right: On at all times Restrictions Weight Bearing Restrictions: Yes RLE Weight Bearing: Partial weight bearing    Mobility  Bed Mobility Overal bed mobility: Needs Assistance Bed Mobility: Supine to Sit     Supine to sit: Min guard     General bed mobility comments: safe techinque with guidance of R LE off bed. Once seated at EOB, pt able to sit with upright posture. No dizziness noted with transfer. KI donned prior to mobility  Transfers Overall transfer level: Needs assistance Equipment used: Rolling walker (2 wheeled) Transfers: Sit to/from Stand Sit to Stand: Supervision         General transfer comment: Improved technique with pt pushing from seated surface. Decreased weight noted on R LE during static stance, educated on placing half weight on R LE. RW adjusted for  comfort.  Ambulation/Gait Ambulation/Gait assistance: Min assist;Min guard Ambulation Distance (Feet): 125 Feet Assistive device: Rolling walker (2 wheeled) Gait Pattern/deviations: Step-to pattern Gait velocity: reduced   General Gait Details: Multiple short bouts of ambulation performed secondary to fatigue in B UE, dizziness, and pain. Needed +2 for chair follow. 3 sitting rest breaks required for >1 minute during each bout of ambulation. Pt ambulates with short step to gait pattern, very short stance time on R LE and demonstrates forward flexed posture. Needs multiple cues for sequencing and upright posture. No LOB noted.   Stairs            Wheelchair Mobility    Modified Rankin (Stroke Patients Only)       Balance                                            Cognition Arousal/Alertness: Awake/alert Behavior During Therapy: WFL for tasks assessed/performed Overall Cognitive Status: Within Functional Limits for tasks assessed                                        Exercises Total Joint Exercises Goniometric ROM: R knee AAROM: 4-75 degrees Other Exercises Other Exercises: Supinet ther0ex performed on R LE including ankle pumps, quad sets, SLRs, hip abd/add, and SAQ. Pt also performed heel slides x 10 reps. ALl ther-ex performed x 15 reps with cga.    General Comments  Pertinent Vitals/Pain Pain Assessment: 0-10 Pain Score: 6  Pain Location: right knee  Pain Descriptors / Indicators: Constant;Operative site guarding;Aching Pain Intervention(s): Limited activity within patient's tolerance;Premedicated before session;Ice applied    Home Living                      Prior Function            PT Goals (current goals can now be found in the care plan section) Acute Rehab PT Goals Patient Stated Goal: to get stronger PT Goal Formulation: With patient Time For Goal Achievement: 08/10/16 Potential to Achieve  Goals: Good Progress towards PT goals: Progressing toward goals    Frequency    BID      PT Plan Current plan remains appropriate    Co-evaluation             End of Session Equipment Utilized During Treatment: Gait belt;Right knee immobilizer Activity Tolerance: Patient limited by fatigue Patient left: in chair;with chair alarm set Nurse Communication: Mobility status PT Visit Diagnosis: Pain;Difficulty in walking, not elsewhere classified (R26.2) Pain - Right/Left: Right Pain - part of body: Knee     Time: 4619-0122 PT Time Calculation (min) (ACUTE ONLY): 40 min  Charges:  $Gait Training: 23-37 mins $Therapeutic Exercise: 8-22 mins                    G Codes:       Greggory Stallion, PT, DPT [phone]    Blessin Kanno 07/29/2016, 12:59 PM

## 2016-07-29 NOTE — Plan of Care (Signed)
Problem: Safety: Goal: Ability to remain free from injury will improve Outcome: Progressing Pt ringing for assistance when needed.  Problem: Pain Managment: Goal: General experience of comfort will improve Outcome: Adequate for Discharge Pain controled with oral pain medication.  Problem: Skin Integrity: Goal: Risk for impaired skin integrity will decrease Outcome: Progressing Surgical dressing remaining dry and intact.  Problem: Bowel/Gastric: Goal: Will not experience complications related to bowel motility Outcome: Adequate for Discharge Able to move bowels yesterday during the day

## 2016-07-29 NOTE — Progress Notes (Signed)
Tolerating cpm this morning

## 2016-07-29 NOTE — Discharge Summary (Signed)
Physician Discharge Summary  Patient ID: Cameron Foster. MRN: [id number] DOB/AGE: Dec 02, 1940 76 y.o.  Admit date: 07/27/2016 Discharge date: 07/29/2016  Admission Diagnoses: Arthritis right knee  Discharge Diagnoses:  Active Problems:   Total knee replacement status   Discharged Condition: good  Hospital Course: Right TKR on 07/27/16 without problems.  Patient did well with minimal pain. Good progress with PT.  Ready for home D/C 07/29/16.  Had to have Dr Erlene Quan place foley catheter in surgery and will see her Monday 08/01/16 for this which has been left in place.   RTC 1 week.  D/C on Plavix due to history of blood clots and Septra for foley. Consults: urology  Significant Diagnostic Studies: radiology: X-Ray: Satisfactory post op knee  Treatments: antibiotics: vancomycin  Discharge Exam: Blood pressure 111/61, pulse 69, temperature 97.3 F (36.3 C), temperature source Oral, resp. rate 18, height 5' 9"  (1.753 m), weight 92.5 kg (204 lb), SpO2 99 %.   Incision/Wound:  Dressing changed.  Wound benign.  Continue TED hose, polar care, and knee immobilizer.  Disposition: 01-Home or Self Care  Discharge Instructions    Call MD for:  persistant nausea and vomiting    Complete by:  As directed    Call MD for:  redness, tenderness, or signs of infection (pain, swelling, redness, odor or green/yellow discharge around incision site)    Complete by:  As directed    Call MD for:  severe uncontrolled pain    Complete by:  As directed    Call MD for:  temperature >100.4    Complete by:  As directed    Diet - low sodium heart healthy    Complete by:  As directed    Discharge instructions    Complete by:  As directed    Partial weight on right leg See Dr Erlene Quan Monday 8:45 AM Move knee every 2-3 hours RTc 1 week   Increase activity slowly    Complete by:  As directed      Allergies as of 07/29/2016      Reactions   Formaldehyde Rash, Other (See Comments)      Medication List     TAKE these medications   amLODipine 5 MG tablet Commonly known as:  NORVASC Take 1 tablet (5 mg total) by mouth daily. What changed:  when to take this   clopidogrel 75 MG tablet Commonly known as:  PLAVIX Take 1 tablet (75 mg total) by mouth daily.   gabapentin 400 MG capsule Commonly known as:  NEURONTIN Take 1 capsule (400 mg total) by mouth 2 (two) times daily.   HYDROcodone-acetaminophen 7.5-325 MG tablet Commonly known as:  NORCO Take 1 tablet by mouth every 6 (six) hours as needed for moderate pain.   lisinopril 10 MG tablet Commonly known as:  PRINIVIL,ZESTRIL Take 1 tablet (10 mg total) by mouth daily.   meloxicam 15 MG tablet Commonly known as:  MOBIC Take 1 tablet (15 mg total) by mouth daily.   metoprolol succinate 25 MG 24 hr tablet Commonly known as:  TOPROL-XL Take 1 tablet (25 mg total) by mouth daily.   multivitamin tablet Take 1 tablet by mouth daily.   omeprazole 40 MG capsule Commonly known as:  PRILOSEC TAKE ONE CAPSULE BY MOUTH ONCE DAILY   oxyCODONE 5 MG immediate release tablet Commonly known as:  ROXICODONE Take 1 tablet (5 mg total) by mouth every 8 (eight) hours as needed for moderate pain.   pantoprazole 40 MG tablet Commonly  known as:  PROTONIX Take by mouth.   polyethylene glycol powder powder Commonly known as:  GLYCOLAX/MIRALAX Take 17 g by mouth 2 (two) times daily as needed. What changed:  when to take this   sertraline 50 MG tablet Commonly known as:  ZOLOFT Take 1 tablet (50 mg total) by mouth daily. What changed:  when to take this   sucralfate 1 g tablet Commonly known as:  [place]        Start     Ordered   07/27/16 1232  DME Walker rolling  Once    Question:  Patient needs a walker to treat with the following condition  Answer:  Total knee replacement status   07/27/16 1231     Follow-up Information    Hollice Espy, MD Follow up on 08/01/2016.   Specialty:   Urology Why:  at 8;45 , for voiding trail. Contact information: [place] [address] [phone]           Signed: Park Breed 07/29/2016, 1:08 PM

## 2016-07-29 NOTE — Progress Notes (Signed)
Physical Therapy Treatment Patient Details Name: Cameron Foster. MRN: [id number] DOB: January 13, 1941 Today's Date: 07/29/2016    History of Present Illness Pt admitted for R TKR. Pt with history of prostate cancer, alcoholism, depression, and GERD. Pt also reports hx of vertigo (treated by ENT) and vasovagal syncope.    PT Comments    Pt is making good progress towards goals with improved ambulation around RN station with a few rest breaks. Needs cues for safety during ambulation. Reviewed written HEP. No dizziness reported with ambulation.   Follow Up Recommendations  Home health PT;Supervision/Assistance - 24 hour     Equipment Recommendations  3in1 (PT)    Recommendations for Other Services       Precautions / Restrictions Precautions Precautions: Fall;Knee Precaution Booklet Issued: Yes (comment) Required Braces or Orthoses: Knee Immobilizer - Right Knee Immobilizer - Right: On at all times Restrictions Weight Bearing Restrictions: Yes RLE Weight Bearing: Partial weight bearing    Mobility  Bed Mobility               General bed mobility comments: not performed as pt received in recliner  Transfers Overall transfer level: Needs assistance Equipment used: Rolling walker (2 wheeled) Transfers: Sit to/from Stand Sit to Stand: Supervision         General transfer comment: Improved technique with pt pushing from seated surface. Decreased weight noted on R LE during static stance, educated on placing half weight on R LE. RW adjusted for comfort.  Ambulation/Gait Ambulation/Gait assistance: Min guard Ambulation Distance (Feet): 150 Feet Assistive device: Rolling walker (2 wheeled) Gait Pattern/deviations: Step-through pattern     General Gait Details: Pt ambulated in hallway with close chair follow secondary to fatigue. Needs 1 seated rest break and 1 standing rest break. Pt cued for safety and to keep RW close. No reports of dizziness this session and pt able  to tolerate further distance this date.   Stairs Stairs:  (pt has no stairs)          Wheelchair Mobility    Modified Rankin (Stroke Patients Only)       Balance                                            Cognition Arousal/Alertness: Awake/alert Behavior During Therapy: WFL for tasks assessed/performed Overall Cognitive Status: Within Functional Limits for tasks assessed                                        Exercises Other Exercises Other Exercises: supine ther-ex performed on R LE including ankle pumps, SLRs, and hip abd/add. ALl ther-ex performed x 12 reps with cga.    General Comments        Pertinent Vitals/Pain Pain Assessment: 0-10 Pain Score: 3  Pain Location: right knee  Pain Descriptors / Indicators: Constant;Operative site guarding;Aching Pain Intervention(s): Limited activity within patient's tolerance    Home Living                      Prior Function            PT Goals (current goals can now be found in the care plan section) Acute Rehab PT Goals Patient Stated Goal: to get stronger PT Goal Formulation: With patient Time  For Goal Achievement: 08/10/16 Potential to Achieve Goals: Good Progress towards PT goals: Progressing toward goals    Frequency    BID      PT Plan Current plan remains appropriate    Co-evaluation             End of Session Equipment Utilized During Treatment: Gait belt;Right knee immobilizer Activity Tolerance: Patient limited by fatigue Patient left: in chair;with chair alarm set Nurse Communication: Mobility status PT Visit Diagnosis: Pain;Difficulty in walking, not elsewhere classified (R26.2) Pain - Right/Left: Right Pain - part of body: Knee     Time: 3374-4514 PT Time Calculation (min) (ACUTE ONLY): 24 min  Charges:  $Gait Training: 8-22 mins $Therapeutic Exercise: 8-22 mins                    G Codes:       Greggory Stallion, PT,  DPT [phone]    Endya Austin 07/29/2016, 4:58 PM

## 2016-07-29 NOTE — Care Management Important Message (Signed)
Important Message  Patient Details  Name: Cameron Foster. MRN: [id number] Date of Birth: [birth year]   Medicare Important Message Given:  Yes    Jolly Mango, RN 07/29/2016, 11:19 AM

## 2016-07-29 NOTE — Care Management Note (Signed)
Case Management Note  Patient Details  Name: Brannon Levene. MRN: [id number] Date of Birth: 11/14/1940  Subjective/Objective:   Pt recommending home with home health. This has been dicussed with Dr. Sabra Heck and he has stated "no, patient to start outpatient PT on Monday"   Case discussed with clinical team leader. Met with patient and his wife at bedside. He is set up to start outpatient physical therapy on Monday, April 16.  Patient given updated PT schedule provided by [facility] case manager Bethena Roys young. Patient states he has a walker. No DME needs. Case closed.               Action/Plan:   Expected Discharge Date:                  Expected Discharge Plan:  Home/Self Care  In-House Referral:     Discharge planning Services  CM Consult  Post Acute Care Choice:    Choice offered to:     DME Arranged:    DME Agency:     HH Arranged:    HH Agency:     Status of Service:  Completed, signed off  If discussed at [employer] of Stay Meetings, dates discussed:    Additional Comments:  Jolly Mango, RN 07/29/2016, 11:56 AM

## 2016-07-29 NOTE — Progress Notes (Signed)
DISCHARGE NOTE:  Pt given discharge instructions and prescriptions. Pt verbalized understanding. Pts dressing changed, TED hose on both legs, Tens units sent with pt. Foley cathter emptied and intact. Pt verbalized understanding. Pt wheeled to car by staff.

## 2016-08-01 ENCOUNTER — Ambulatory Visit (INDEPENDENT_AMBULATORY_CARE_PROVIDER_SITE_OTHER): Payer: Medicare Other | Admitting: Family Medicine

## 2016-08-01 VITALS — BP 99/65 | HR 89 | Ht 69.0 in | Wt 200.0 lb

## 2016-08-01 DIAGNOSIS — M25661 Stiffness of right knee, not elsewhere classified: Secondary | ICD-10-CM | POA: Diagnosis not present

## 2016-08-01 DIAGNOSIS — R339 Retention of urine, unspecified: Secondary | ICD-10-CM

## 2016-08-01 DIAGNOSIS — M25561 Pain in right knee: Secondary | ICD-10-CM | POA: Diagnosis not present

## 2016-08-01 DIAGNOSIS — R600 Localized edema: Secondary | ICD-10-CM | POA: Diagnosis not present

## 2016-08-01 NOTE — Progress Notes (Signed)
Fill and Pull Catheter Removal  Patient is present today for a catheter removal.  Patient was cleaned and prepped in a sterile fashion 239m of sterile water/ saline was instilled into the bladder when the patient felt the urge to urinate. 136mof water was then drained from the balloon.  A 16FR foley cath was removed from the bladder no complications were noted .  Patient as then given some time to void on their own.  Patient can void  20017mn their own after some time.  Patient tolerated well.  Preformed by: Cameron LeatherwoodMA  Follow up/ Additional notes: 2 weeks with Dr BudPilar Jarvis

## 2016-08-03 DIAGNOSIS — M25561 Pain in right knee: Secondary | ICD-10-CM | POA: Diagnosis not present

## 2016-08-03 DIAGNOSIS — M25661 Stiffness of right knee, not elsewhere classified: Secondary | ICD-10-CM | POA: Diagnosis not present

## 2016-08-03 DIAGNOSIS — R609 Edema, unspecified: Secondary | ICD-10-CM | POA: Diagnosis not present

## 2016-08-05 ENCOUNTER — Other Ambulatory Visit: Payer: Medicare Other

## 2016-08-05 DIAGNOSIS — M1712 Unilateral primary osteoarthritis, left knee: Secondary | ICD-10-CM | POA: Diagnosis not present

## 2016-08-05 DIAGNOSIS — Z96651 Presence of right artificial knee joint: Secondary | ICD-10-CM | POA: Diagnosis not present

## 2016-08-08 DIAGNOSIS — M25561 Pain in right knee: Secondary | ICD-10-CM | POA: Diagnosis not present

## 2016-08-08 DIAGNOSIS — M25661 Stiffness of right knee, not elsewhere classified: Secondary | ICD-10-CM | POA: Diagnosis not present

## 2016-08-08 DIAGNOSIS — R609 Edema, unspecified: Secondary | ICD-10-CM | POA: Diagnosis not present

## 2016-08-10 DIAGNOSIS — M25561 Pain in right knee: Secondary | ICD-10-CM | POA: Diagnosis not present

## 2016-08-10 DIAGNOSIS — M25661 Stiffness of right knee, not elsewhere classified: Secondary | ICD-10-CM | POA: Diagnosis not present

## 2016-08-10 DIAGNOSIS — R609 Edema, unspecified: Secondary | ICD-10-CM | POA: Diagnosis not present

## 2016-08-12 ENCOUNTER — Ambulatory Visit: Payer: Medicare Other

## 2016-08-15 DIAGNOSIS — M25561 Pain in right knee: Secondary | ICD-10-CM | POA: Diagnosis not present

## 2016-08-15 DIAGNOSIS — R609 Edema, unspecified: Secondary | ICD-10-CM | POA: Diagnosis not present

## 2016-08-15 DIAGNOSIS — M25661 Stiffness of right knee, not elsewhere classified: Secondary | ICD-10-CM | POA: Diagnosis not present

## 2016-08-17 DIAGNOSIS — M25661 Stiffness of right knee, not elsewhere classified: Secondary | ICD-10-CM | POA: Diagnosis not present

## 2016-08-17 DIAGNOSIS — M25561 Pain in right knee: Secondary | ICD-10-CM | POA: Diagnosis not present

## 2016-08-17 DIAGNOSIS — R609 Edema, unspecified: Secondary | ICD-10-CM | POA: Diagnosis not present

## 2016-08-19 ENCOUNTER — Other Ambulatory Visit: Payer: Self-pay | Admitting: Orthopedic Surgery

## 2016-08-19 ENCOUNTER — Ambulatory Visit
Admission: RE | Admit: 2016-08-19 | Discharge: 2016-08-19 | Disposition: A | Discharge status: Home / Self Care | Payer: Medicare Other | Source: Ambulatory Visit | Attending: Orthopedic Surgery | Admitting: Orthopedic Surgery

## 2016-08-19 DIAGNOSIS — R609 Edema, unspecified: Secondary | ICD-10-CM | POA: Diagnosis not present

## 2016-08-19 DIAGNOSIS — R600 Localized edema: Secondary | ICD-10-CM | POA: Diagnosis not present

## 2016-08-22 ENCOUNTER — Other Ambulatory Visit: Payer: Self-pay

## 2016-08-22 ENCOUNTER — Other Ambulatory Visit: Payer: Medicare Other

## 2016-08-22 DIAGNOSIS — N401 Enlarged prostate with lower urinary tract symptoms: Secondary | ICD-10-CM

## 2016-08-22 DIAGNOSIS — M25661 Stiffness of right knee, not elsewhere classified: Secondary | ICD-10-CM | POA: Diagnosis not present

## 2016-08-22 DIAGNOSIS — R609 Edema, unspecified: Secondary | ICD-10-CM | POA: Diagnosis not present

## 2016-08-22 DIAGNOSIS — M25561 Pain in right knee: Secondary | ICD-10-CM | POA: Diagnosis not present

## 2016-08-23 LAB — PSA: Prostate Specific Ag, Serum: <0.1 ng/mL (ref 0.0–4.0)

## 2016-08-24 DIAGNOSIS — M25561 Pain in right knee: Secondary | ICD-10-CM | POA: Diagnosis not present

## 2016-08-24 DIAGNOSIS — R609 Edema, unspecified: Secondary | ICD-10-CM | POA: Diagnosis not present

## 2016-08-24 DIAGNOSIS — M25661 Stiffness of right knee, not elsewhere classified: Secondary | ICD-10-CM | POA: Diagnosis not present

## 2016-08-25 ENCOUNTER — Ambulatory Visit (INDEPENDENT_AMBULATORY_CARE_PROVIDER_SITE_OTHER): Payer: Medicare Other | Admitting: Urology

## 2016-08-25 ENCOUNTER — Encounter: Payer: Self-pay | Admitting: Urology

## 2016-08-25 VITALS — BP 128/75 | HR 74 | Ht 69.0 in | Wt 209.4 lb

## 2016-08-25 DIAGNOSIS — C61 Malignant neoplasm of prostate: Secondary | ICD-10-CM

## 2016-08-25 DIAGNOSIS — R399 Unspecified symptoms and signs involving the genitourinary system: Secondary | ICD-10-CM

## 2016-08-25 DIAGNOSIS — N529 Male erectile dysfunction, unspecified: Secondary | ICD-10-CM

## 2016-08-25 DIAGNOSIS — Z87448 Personal history of other diseases of urinary system: Secondary | ICD-10-CM | POA: Diagnosis not present

## 2016-08-25 NOTE — Progress Notes (Signed)
08/25/2016 1:54 PM   [place]. [birth year] [id number]  Referring provider: Leone Haven, MD [address] [place], [place] [address]  Chief Complaint  Patient presents with  . Prostate Cancer    HPI: 76 yo WM who presents today for a one year follow up.  1 - Moderate Risk Prostate Cancer - s/p external beam + 36mo androgen deprivation for Gleason 7 disease 2012. Recent Surveillance: 08/2016 PSA <0.1  2 - Erectile Dysfunction - inability to achieve erection x years. Used to use PDE5i but now minimally sexually active and not big priority - discussed Trimix injections - more interested in increasing the size of the penis  3 - Lower Urinary Tract Symptoms - long h/o mix of obstructive and irritative symptoms even pre-radiation therapy. PVR 07/2015 "357m / normal today  4 - History of gross Hematuria / Clot Retention - no report of hematuria - UA in 06/2016 negative for hematuria  PMH: Past Medical History:  Diagnosis Date  . Alcoholism ([facility]  . Arrhythmia   . Arthritis   . Asthma   . Atrial fibrillation (HCEzel   one episode  . Colon polyp   . Depression   . Diverticulitis   . Diverticulosis 30 years  . Fainting   . GERD (gastroesophageal reflux disease)   . History of blood clots   . Hyperlipidemia   . Hypertension   . Irritable bowel syndrome   . Prostate cancer ([place]   treated with radiation therapy.  . Vasovagal syncope   . Vertigo     Surgical History: Past Surgical History:  Procedure Laterality Date  . CARDIAC CATHETERIZATION     [place] no stents  . CATARACT EXTRACTION, BILATERAL    . COLONOSCOPY  2016  . COLONOSCOPY WITH PROPOFOL N/A 05/30/2016   Procedure: COLONOSCOPY WITH PROPOFOL;  Surgeon: MaLollie SailsMD;  [facility];  Service: Endoscopy;  Laterality: N/A;  . ESOPHAGOGASTRODUODENOSCOPY    . ESOPHAGOGASTRODUODENOSCOPY N/A 05/30/2016   Procedure: ESOPHAGOGASTRODUODENOSCOPY (EGD);  Surgeon:  MaLollie SailsMD;  [facility];  Service: Endoscopy;  Laterality: N/A;  . FRACTURE SURGERY Bilateral    right arm and left wrist  . KNEE ARTHROSCOPY    . PROSTATE SURGERY     Microwave therapy  . TONSILLECTOMY    . TOTAL KNEE ARTHROPLASTY Right 07/27/2016   Procedure: TOTAL KNEE ARTHROPLASTY;  Surgeon: HoEarnestine LeysMD;  [facility];  Service: Orthopedics;  Laterality: Right;  Dr. BrErlene Quanad to place Urinary catheter due to prostate cancer history.  Using flexible scope.    Home Medications:  Allergies as of 08/25/2016      Reactions   Formaldehyde Rash, Other (See Comments)      Medication List       Accurate as of 08/25/16  1:54 PM. Always use your most recent med list.          amLODipine 5 MG tablet Commonly known as:  NORVASC Take 1 tablet (5 mg total) by mouth daily.   clopidogrel 75 MG tablet Commonly known as:  PLAVIX Take 1 tablet (75 mg total) by mouth daily.   gabapentin 400 MG capsule Commonly known as:  NEURONTIN Take 1 capsule (400 mg total) by mouth 2 (two) times daily.   HYDROcodone-acetaminophen 7.5-325 MG tablet Commonly known as:  NORCO Take 1 tablet by mouth every 6 (six) hours as needed for moderate pain.   lisinopril 10 MG tablet Commonly known as:  PRINIVIL,ZESTRIL Take 1 tablet (10 mg total) by mouth daily.   meloxicam 15 MG tablet Commonly known as:  MOBIC Take 1 tablet (15 mg total) by mouth daily.   metoprolol succinate 25 MG 24 hr tablet Commonly known as:  TOPROL-XL Take 1 tablet (25 mg total) by mouth daily.   multivitamin tablet Take 1 tablet by mouth daily.   oxyCODONE 5 MG immediate release tablet Commonly known as:  ROXICODONE Take 1 tablet (5 mg total) by mouth every 8 (eight) hours as needed for moderate pain.   pantoprazole 40 MG tablet Commonly known as:  PROTONIX Take by mouth.   polyethylene glycol powder powder Commonly known as:  GLYCOLAX/MIRALAX Take 17 g by mouth 2 (two) times daily  as needed.   sertraline 50 MG tablet Commonly known as:  ZOLOFT Take 1 tablet (50 mg total) by mouth daily.   sucralfate 1 g tablet Commonly known as:  CARAFATE   sulfamethoxazole-trimethoprim 800-160 MG tablet Commonly known as:  BACTRIM DS,SEPTRA DS Take 1 tablet by mouth 2 (two) times daily.       Allergies:  Allergies  Allergen Reactions  . Formaldehyde Rash and Other (See Comments)    Family History: Family History  Problem Relation Age of Onset  . Lung cancer Father   . Bipolar disorder Son   . Sudden death Son        due to drug over dose and cardiac issues  . Prostate cancer Neg Hx   . Bladder Cancer Neg Hx     Social History:  reports that he quit smoking about 28 years ago. He quit after 27.00 years of use. His smokeless tobacco use includes Chew. He reports that he drinks about 0.6 oz of alcohol per week . He reports that he does not use drugs.  ROS: UROLOGY Frequent Urination?: Yes Hard to postpone urination?: No Burning/pain with urination?: No Get up at night to urinate?: Yes Leakage of urine?: No Urine stream starts and stops?: Yes Trouble starting stream?: No Do you have to strain to urinate?: No Blood in urine?: No Urinary tract infection?: No Sexually transmitted disease?: No Injury to kidneys or bladder?: No Painful intercourse?: No Weak stream?: No Erection problems?: Yes Penile pain?: No  Gastrointestinal Nausea?: No Vomiting?: No Indigestion/heartburn?: No Diarrhea?: No Constipation?: No  Constitutional Fever: No Night sweats?: No Weight loss?: No Fatigue?: No  Skin Skin rash/lesions?: No Itching?: Yes  Eyes Blurred vision?: No Double vision?: No  Ears/Nose/Throat Sore throat?: No Sinus problems?: No  Hematologic/Lymphatic Swollen glands?: No Easy bruising?: No  Cardiovascular Leg swelling?: Yes Chest pain?: No  Respiratory Cough?: No Shortness of breath?: No  Endocrine Excessive thirst?:  No  Musculoskeletal Back pain?: No Joint pain?: No  Neurological Headaches?: No Dizziness?: No  Psychologic Depression?: No Anxiety?: No  Physical Exam: BP 128/75 (BP Location: Left Arm, Patient Position: Sitting, Cuff Size: Normal)   Pulse 74   Ht 5' 9"  (1.753 m)   Wt 209 lb 6.4 oz (95 kg)   BMI 30.92 kg/m   Constitutional: Well nourished. Alert and oriented, No acute distress. HEENT: [place] AT, moist mucus membranes. Trachea midline, no masses. Cardiovascular: No clubbing, cyanosis, or edema. Respiratory: Normal respiratory effort, no increased work of breathing. GI: Abdomen is soft, non tender, non distended, no abdominal masses. Liver and spleen not palpable.  No hernias appreciated.  Stool sample for occult testing is not indicated.   GU: No CVA tenderness.  No bladder fullness or masses.  Patient with circumcised phallus. Urethral meatus is patent.  No penile discharge. No penile lesions or rashes. Scrotum without lesions, cysts, rashes and/or edema.  Testicles are located scrotally bilaterally. No masses are appreciated in the testicles. Left and right epididymis are normal. Rectal: Patient with  normal sphincter tone. Anus and perineum without scarring or rashes. No rectal masses are appreciated. Prostate is small and fibrous Skin: No rashes, bruises or suspicious lesions. Lymph: No cervical or inguinal adenopathy. Neurologic: Grossly intact, no focal deficits, moving all 4 extremities. Psychiatric: Normal mood and affect.  Laboratory Data: Lab Results  Component Value Date   WBC 8.5 07/29/2016   HGB 10.1 (L) 07/29/2016   HCT 29.6 (L) 07/29/2016   MCV 86.0 07/29/2016   PLT 183 07/29/2016    Lab Results  Component Value Date   CREATININE 1.39 (H) 07/28/2016    Lab Results  Component Value Date   PSA 0.05 (L) 07/30/2015   PSA 0.09 (L) 01/20/2015    Lab Results  Component Value Date   HGBA1C 5.6 05/16/2016     Assessment & Plan:    1 - Moderate Risk  Prostate Cancer - PSA kinetics suggests excellent control. Repeat PSA in one year  2 - Erectile Dysfunction - observe as per pt request.  3 - Lower Urinary Tract Symptoms - Minimal at this time   4 - History of Gross Hematuria / Clot Retention -Resolved. CT showed cysts and mild bladder wall thickening. Cysto 2017 with mild radiation changes but no tumor. No gross hematuria.  No AMH on 06/2016.  Continue to monitor for recurrence  Return in about 1 year (around 08/25/2017) for PSA and office visit with Dr. Pilar Jarvis.  Zara Council, [facility] [address], [place], [place] [address] [phone]

## 2016-08-29 DIAGNOSIS — R609 Edema, unspecified: Secondary | ICD-10-CM | POA: Diagnosis not present

## 2016-08-31 DIAGNOSIS — R609 Edema, unspecified: Secondary | ICD-10-CM | POA: Diagnosis not present

## 2016-08-31 DIAGNOSIS — M25661 Stiffness of right knee, not elsewhere classified: Secondary | ICD-10-CM | POA: Diagnosis not present

## 2016-08-31 DIAGNOSIS — M25561 Pain in right knee: Secondary | ICD-10-CM | POA: Diagnosis not present

## 2016-09-02 ENCOUNTER — Ambulatory Visit (INDEPENDENT_AMBULATORY_CARE_PROVIDER_SITE_OTHER): Payer: Medicare Other

## 2016-09-02 VITALS — BP 132/72 | HR 74 | Temp 98.7°F | Resp 14 | Ht 69.0 in | Wt 209.4 lb

## 2016-09-02 DIAGNOSIS — Z0000 Encounter for general adult medical examination without abnormal findings: Secondary | ICD-10-CM

## 2016-09-02 NOTE — Patient Instructions (Addendum)
  Cameron Foster , Thank you for taking time to come for your Medicare Wellness Visit. I appreciate your ongoing commitment to your health goals. Please review the following plan we discussed and let me know if I can assist you in the future.   Follow up with Dr. Caryl Bis as needed.    Have a great day!  These are the goals we discussed: Goals    .  Increase physical activity          Complete physical therapy exercises.  Continue proper range of motion at home.       This is a list of the screening recommended for you and due dates:  Health Maintenance  Topic Date Due  . Tetanus Vaccine  09[birth year]01/1959  . Pneumonia vaccines (1 of 2 - PCV13) 08/31/2005  . Flu Shot  11/16/2016  . Colon Cancer Screening  05/31/2019

## 2016-09-02 NOTE — Progress Notes (Signed)
Subjective:   Cameron Foster. is a 76 y.o. male who presents for an Initial Medicare Annual Wellness Visit.  Review of Systems  No ROS.  Medicare Wellness Visit. Cardiac Risk Factors include: advanced age (>77mn, >>70women);male gender;hypertension;obesity (BMI >30kg/m2);smoking/ tobacco exposure    Objective:    Today's Vitals   09/02/16 1555 09/02/16 1713  BP: 132/72   Pulse: 74   Resp: 14   Temp: 98.7 F (37.1 C)   TempSrc: Oral   SpO2: 97%   Weight: 209 lb 6.4 oz (95 kg)   Height: 5' 9"  (1.753 m)   PainSc:  3    Body mass index is 30.92 kg/m.  Current Medications (verified) Outpatient Encounter Prescriptions as of 09/02/2016  Medication Sig  . amLODipine (NORVASC) 5 MG tablet Take 1 tablet (5 mg total) by mouth daily. (Patient taking differently: Take 5 mg by mouth every evening. )  . clopidogrel (PLAVIX) 75 MG tablet Take 1 tablet (75 mg total) by mouth daily.  .Marland Kitchengabapentin (NEURONTIN) 400 MG capsule Take 1 capsule (400 mg total) by mouth 2 (two) times daily.  .Marland KitchenHYDROcodone-acetaminophen (NORCO) 7.5-325 MG tablet Take 1 tablet by mouth every 6 (six) hours as needed for moderate pain.  .Marland Kitchenlisinopril (PRINIVIL,ZESTRIL) 10 MG tablet Take 1 tablet (10 mg total) by mouth daily.  . meloxicam (MOBIC) 15 MG tablet Take 1 tablet (15 mg total) by mouth daily.  . metoprolol succinate (TOPROL-XL) 25 MG 24 hr tablet Take 1 tablet (25 mg total) by mouth daily.  . pantoprazole (PROTONIX) 40 MG tablet Take by mouth.  . polyethylene glycol powder (GLYCOLAX/MIRALAX) powder Take 17 g by mouth 2 (two) times daily as needed. (Patient taking differently: Take 17 g by mouth daily. )  . sertraline (ZOLOFT) 50 MG tablet Take 1 tablet (50 mg total) by mouth daily. (Patient taking differently: Take 50 mg by mouth at bedtime. )  . sucralfate (CARAFATE) 1 g tablet   . [DISCONTINUED] Multiple Vitamin (MULTIVITAMIN) tablet Take 1 tablet by mouth daily.  . [DISCONTINUED] oxyCODONE (ROXICODONE) 5  MG immediate release tablet Take 1 tablet (5 mg total) by mouth every 8 (eight) hours as needed for moderate pain. (Patient not taking: Reported on 08/25/2016)  . [DISCONTINUED] sulfamethoxazole-trimethoprim (BACTRIM DS,SEPTRA DS) 800-160 MG tablet Take 1 tablet by mouth 2 (two) times daily. (Patient not taking: Reported on 08/25/2016)   No facility-administered encounter medications on file as of 09/02/2016.     Allergies (verified) Formaldehyde   History: Past Medical History:  Diagnosis Date  . Alcoholism ([place]   . Arrhythmia   . Arthritis   . Asthma   . Atrial fibrillation ([place]    one episode  . Colon polyp   . Depression   . Diverticulitis   . Diverticulosis 30 years  . Fainting   . GERD (gastroesophageal reflux disease)   . History of blood clots   . Hyperlipidemia   . Hypertension   . Irritable bowel syndrome   . Prostate cancer ([place] 2012   treated with radiation therapy.  . Vasovagal syncope   . Vertigo    Past Surgical History:  Procedure Laterality Date  . CARDIAC CATHETERIZATION     [place] no stents  . CATARACT EXTRACTION, BILATERAL    . COLONOSCOPY  2016  . COLONOSCOPY WITH PROPOFOL N/A 05/30/2016   Procedure: COLONOSCOPY WITH PROPOFOL;  Surgeon: MLollie Sails MD;  [facility];  Service: Endoscopy;  Laterality: N/A;  . ESOPHAGOGASTRODUODENOSCOPY    .  ESOPHAGOGASTRODUODENOSCOPY N/A 05/30/2016   Procedure: ESOPHAGOGASTRODUODENOSCOPY (EGD);  Surgeon: Lollie Sails, MD;  [facility];  Service: Endoscopy;  Laterality: N/A;  . FRACTURE SURGERY Bilateral    right arm and left wrist  . KNEE ARTHROSCOPY    . PROSTATE SURGERY     Microwave therapy  . TONSILLECTOMY    . TOTAL KNEE ARTHROPLASTY Right 07/27/2016   Procedure: TOTAL KNEE ARTHROPLASTY;  Surgeon: [name], MD;  Location: [facility] ORS;  Service: Orthopedics;  Laterality: Right;  Dr. Erlene Quan had to place Urinary catheter due to prostate cancer history.  Using flexible  scope.   Family History  Problem Relation Age of Onset  . Lung cancer Father   . Bipolar disorder Son   . Sudden death Son        due to drug over dose and cardiac issues  . Prostate cancer Neg Hx   . Bladder Cancer Neg Hx    Social History   Occupational History  . Not on file.   Social History Main Topics  . Smoking status: Former Smoker    Years: 27.00    Quit date: 04/18/1988  . Smokeless tobacco: Current User    Types: Chew  . Alcohol use 0.6 oz/week    1 Standard drinks or equivalent per week     Comment: former.  occ.  . Drug use: No  . Sexual activity: Not Currently   Tobacco Counseling Ready to quit: No Counseling given: Yes   Activities of Daily Living In your present state of health, do you have any difficulty performing the following activities: 09/02/2016 07/27/2016  Hearing? N N  Vision? N N  Difficulty concentrating or making decisions? N N  Walking or climbing stairs? Y N  Dressing or bathing? N N  Doing errands, shopping? Y N  Preparing Food and eating ? N -  Using the Toilet? N -  In the past six months, have you accidently leaked urine? Y -  Do you have problems with loss of bowel control? N -  Managing your Medications? N -  Managing your Finances? N -  Housekeeping or managing your Housekeeping? N -  Some recent data might be hidden    Immunizations and Health Maintenance Immunization History  Administered Date(s) Administered  . Influenza,inj,Quad PF,36+ Mos 02/08/2016   Health Maintenance Due  Topic Date Due  . TETANUS/TDAP  09[birth year]01/1959  . PNA vac Low Risk Adult (1 of 2 - PCV13) 08/31/2005    Patient Care Team: Leone Haven, MD as PCP - General (Family Medicine) Caryl Bis Angela Adam, MD as Consulting Physician (Family Medicine) Bary Castilla, Forest Gleason, MD (General Surgery) Wellington Hampshire, MD as Consulting Physician (Cardiology)  Indicate any recent Medical Services you may have received from other than [facility] providers in the past  year (date may be approximate).    Assessment:   This is a routine wellness examination for [facility]. The goal of the wellness visit is to assist the patient how to close the gaps in care and create a preventative care plan for the patient.   Taking calcium VIT D as appropriate/Osteoporosis risk reviewed.  Medications reviewed; taking without issues or barriers.  Safety issues reviewed; smoke detectors in the home. Firearms locked up in the home. Wears seatbelts when driving or riding with others. Patient does wear sunscreen or protective clothing when in direct sunlight. No violence in the home.  Depression- PHQ 2 &9 complete.  No signs/symptoms or verbal communication regarding little pleasure in  doing things, feeling down, depressed or hopeless. No changes in sleeping, energy, eating, concentrating.  No thoughts of self harm or harm towards others.  Time spent on this topic is 8 minutes.   Patient is alert, normal appearance, oriented to person/place/and time. Correctly identified the president of the [place], recall of 3/3 words, and performing simple calculations.  Patient displays appropriate judgement and can read correct time from watch face.  No new identified risk were noted.  No failures at ADL's or IADL's.  BMI- discussed the importance of a healthy diet, water intake and exercise. Educational material provided.   HTN- followed by PCP.  Prevnar 13 and TDAP vaccine deferred per patient preference.   Educational material provided.  Patient Concerns: Recent knee surgery.  Currently attending physical therapy 2x weekly.  Compression socks in place.  Tolerating okay.  Taking Norco as directed for pain.  States he will call back to schedule a follow up with PCP to ensure pain/swelling at the site is appropriate.    Hearing/Vision screen Hearing Screening Comments: Patient is able to hear conversational tones without difficulty.  No issues reported.   Vision Screening Comments:  He does not currently have an opthalmologist. Wears corrective lenses Cataract extraction, bilateral Visual acuity not assessed per patient preference.   He plans to make an appointment with [place] soon.  Dietary issues and exercise activities discussed: Current Exercise Habits: Structured exercise class, Type of exercise: stretching (Physical therapy), Time (Minutes): 60, Frequency (Times/Week): 2, Weekly Exercise (Minutes/Week): 120, Intensity: Mild  Goals    .  Increase physical activity          Complete physical therapy exercises.  Continue proper range of motion at home.      Depression Screen PHQ 2/9 Scores 09/02/2016 06/24/2016 01/20/2015  PHQ - 2 Score 0 0 1  PHQ- 9 Score 0 - 4    Fall Risk Fall Risk  09/02/2016 06/24/2016  Falls in the past year? Yes No  Number falls in past yr: 1 -  Injury with Fall? Yes -  Risk for fall due to : Impaired balance/gait -  Follow up Education provided;Falls prevention discussed -    Cognitive Function:     6CIT Screen 09/02/2016  What Year? 0 points  What month? 0 points  What time? 0 points  Count back from 20 0 points  Months in reverse 0 points  Repeat phrase 0 points  Total Score 0    Screening Tests Health Maintenance  Topic Date Due  . TETANUS/TDAP  09/01/1959  . PNA vac Low Risk Adult (1 of 2 - PCV13) 08/31/2005  . INFLUENZA VACCINE  11/16/2016  . COLONOSCOPY  05/31/2019        Plan:    End of life planning; Advance aging; Advanced directives discussed. Copy of current HCPOA/Living Will on file.      I have personally reviewed and noted the following in the patient's chart:   . Medical and social history . Use of alcohol, tobacco or illicit drugs  . Current medications and supplements . Functional ability and status . Nutritional status . Physical activity . Advanced directives . List of other physicians . Hospitalizations, surgeries, and ER visits in previous 12 months . Vitals . Screenings to  include cognitive, depression, and falls . Referrals and appointments  In addition, I have reviewed and discussed with patient certain preventive protocols, quality metrics, and best practice recommendations. A written personalized care plan for preventive services as well as general  preventive health recommendations were provided to patient.     Varney Biles, LPN   0/01/9322

## 2016-09-05 DIAGNOSIS — M25661 Stiffness of right knee, not elsewhere classified: Secondary | ICD-10-CM | POA: Diagnosis not present

## 2016-09-05 DIAGNOSIS — M25561 Pain in right knee: Secondary | ICD-10-CM | POA: Diagnosis not present

## 2016-09-05 DIAGNOSIS — R609 Edema, unspecified: Secondary | ICD-10-CM | POA: Diagnosis not present

## 2016-09-05 NOTE — Progress Notes (Signed)
I have reviewed the above note and agree. Patient to contact us if he would like follow up for his knee, though I would also recommend seeing his surgeon for follow-up as well.   [name], M.D.

## 2016-09-07 ENCOUNTER — Encounter: Payer: Self-pay | Admitting: Family Medicine

## 2016-09-07 ENCOUNTER — Ambulatory Visit (INDEPENDENT_AMBULATORY_CARE_PROVIDER_SITE_OTHER): Payer: Medicare Other | Admitting: Family Medicine

## 2016-09-07 VITALS — BP 110/70 | HR 80 | Temp 98.3°F | Wt 207.6 lb

## 2016-09-07 DIAGNOSIS — Z85828 Personal history of other malignant neoplasm of skin: Secondary | ICD-10-CM | POA: Diagnosis not present

## 2016-09-07 DIAGNOSIS — Z9889 Other specified postprocedural states: Secondary | ICD-10-CM | POA: Insufficient documentation

## 2016-09-07 DIAGNOSIS — L821 Other seborrheic keratosis: Secondary | ICD-10-CM | POA: Diagnosis not present

## 2016-09-07 DIAGNOSIS — D485 Neoplasm of uncertain behavior of skin: Secondary | ICD-10-CM | POA: Diagnosis not present

## 2016-09-07 DIAGNOSIS — M7989 Other specified soft tissue disorders: Secondary | ICD-10-CM

## 2016-09-07 DIAGNOSIS — L309 Dermatitis, unspecified: Secondary | ICD-10-CM | POA: Diagnosis not present

## 2016-09-07 DIAGNOSIS — C44319 Basal cell carcinoma of skin of other parts of face: Secondary | ICD-10-CM | POA: Diagnosis not present

## 2016-09-07 NOTE — Patient Instructions (Signed)
Nice to see you. I would suggest wearing the compression stockings. If the swelling does not improve please let us know. We'll refer you to dermatology.

## 2016-09-07 NOTE — Progress Notes (Signed)
  [name], MD Phone: [phone]  Cameron Foster. is a 76 y.o. male who presents today for follow-up.  Right leg swelling: Patient had his right knee replaced about 6 weeks ago. Notes his lower leg has been swollen since then. Has been chronically swollen prior to that though slightly worse since surgery. Notes it will not go down as easily as before. He saw the orthopedist and they ordered an ultrasound that did not reveal a blood clot though did reveal hematoma. He notes no pain. The swelling has not worsened. It goes down when he props his leg up. He is supposed to be wearing compression stockings though he does not like them.  He would like to see a local dermatologist. He follows with somebody in [place] for prior skin cancer. He has a history of Mohs surgery.  ROS see history of present illness  Objective  Physical Exam Vitals:   09/07/16 0849  BP: 110/70  Pulse: 80  Temp: 98.3 F (36.8 C)    BP Readings from Last 3 Encounters:  09/07/16 110/70  09/02/16 132/72  08/25/16 128/75   Wt Readings from Last 3 Encounters:  09/07/16 207 lb 9.6 oz (94.2 kg)  09/02/16 209 lb 6.4 oz (95 kg)  08/25/16 209 lb 6.4 oz (95 kg)    Physical Exam  Constitutional: No distress.  Cardiovascular: Normal rate, regular rhythm and normal heart sounds.   Pulmonary/Chest: Effort normal and breath sounds normal.  Musculoskeletal:  Right lower extremity with swelling compared to left, anterior knee scar appears well-healed. No warmth or erythema or tenderness of the knee, no tenderness of the lower leg, no cords  Neurological: He is alert.  Skin: He is not diaphoretic.     Assessment/Plan: Please see individual problem list.  Swelling of right lower extremity This has been a chronic issue. Slightly worse than previously. He has already undergone an ultrasound to rule out DVT. No DVT was seen. There was a hematoma. I discussed that it may take weeks to months for the hematoma to  completely resolve. Potentially related to a hematoma and venous insufficiency versus swelling related to knee surgery. Encouraged him to wear the compression stockings and keep his legs elevated. He'll continue to monitor.  History of Moh's micrographic surgery for skin cancer Refer to local dermatologist.   Orders Placed This Encounter  Procedures  . Ambulatory referral to Dermatology    Referral Priority:   Routine    Referral Type:   Consultation    Referral Reason:   Specialty Services Required    Requested Specialty:   Dermatology    Number of Visits Requested:   1   [name], MD [place]

## 2016-09-07 NOTE — Assessment & Plan Note (Signed)
Refer to local dermatologist.

## 2016-09-07 NOTE — Assessment & Plan Note (Signed)
This has been a chronic issue. Slightly worse than previously. He has already undergone an ultrasound to rule out DVT. No DVT was seen. There was a hematoma. I discussed that it may take weeks to months for the hematoma to completely resolve. Potentially related to a hematoma and venous insufficiency versus swelling related to knee surgery. Encouraged him to wear the compression stockings and keep his legs elevated. He'll continue to monitor.

## 2016-09-08 DIAGNOSIS — M25661 Stiffness of right knee, not elsewhere classified: Secondary | ICD-10-CM | POA: Diagnosis not present

## 2016-09-08 DIAGNOSIS — R609 Edema, unspecified: Secondary | ICD-10-CM | POA: Diagnosis not present

## 2016-09-08 DIAGNOSIS — M25561 Pain in right knee: Secondary | ICD-10-CM | POA: Diagnosis not present

## 2016-09-14 DIAGNOSIS — M25561 Pain in right knee: Secondary | ICD-10-CM | POA: Diagnosis not present

## 2016-09-14 DIAGNOSIS — M25661 Stiffness of right knee, not elsewhere classified: Secondary | ICD-10-CM | POA: Diagnosis not present

## 2016-09-14 DIAGNOSIS — R600 Localized edema: Secondary | ICD-10-CM | POA: Diagnosis not present

## 2016-09-17 ENCOUNTER — Encounter: Payer: Self-pay | Admitting: Family Medicine

## 2016-09-26 ENCOUNTER — Other Ambulatory Visit: Payer: Self-pay | Admitting: Family Medicine

## 2016-09-27 DIAGNOSIS — R600 Localized edema: Secondary | ICD-10-CM | POA: Diagnosis not present

## 2016-09-27 DIAGNOSIS — M25561 Pain in right knee: Secondary | ICD-10-CM | POA: Diagnosis not present

## 2016-09-27 DIAGNOSIS — M25661 Stiffness of right knee, not elsewhere classified: Secondary | ICD-10-CM | POA: Diagnosis not present

## 2016-09-29 DIAGNOSIS — R600 Localized edema: Secondary | ICD-10-CM | POA: Diagnosis not present

## 2016-09-29 DIAGNOSIS — M25561 Pain in right knee: Secondary | ICD-10-CM | POA: Diagnosis not present

## 2016-09-29 DIAGNOSIS — M25661 Stiffness of right knee, not elsewhere classified: Secondary | ICD-10-CM | POA: Diagnosis not present

## 2016-10-03 ENCOUNTER — Encounter: Payer: Self-pay | Admitting: Family Medicine

## 2016-10-04 DIAGNOSIS — R600 Localized edema: Secondary | ICD-10-CM | POA: Diagnosis not present

## 2016-10-04 DIAGNOSIS — M25661 Stiffness of right knee, not elsewhere classified: Secondary | ICD-10-CM | POA: Diagnosis not present

## 2016-10-04 DIAGNOSIS — M25561 Pain in right knee: Secondary | ICD-10-CM | POA: Diagnosis not present

## 2016-10-06 DIAGNOSIS — M25661 Stiffness of right knee, not elsewhere classified: Secondary | ICD-10-CM | POA: Diagnosis not present

## 2016-10-06 DIAGNOSIS — R600 Localized edema: Secondary | ICD-10-CM | POA: Diagnosis not present

## 2016-10-06 DIAGNOSIS — K5790 Diverticulosis of intestine, part unspecified, without perforation or abscess without bleeding: Secondary | ICD-10-CM | POA: Diagnosis not present

## 2016-10-06 DIAGNOSIS — M25561 Pain in right knee: Secondary | ICD-10-CM | POA: Diagnosis not present

## 2016-10-13 ENCOUNTER — Other Ambulatory Visit: Payer: Self-pay | Admitting: Gastroenterology

## 2016-10-13 DIAGNOSIS — K5790 Diverticulosis of intestine, part unspecified, without perforation or abscess without bleeding: Secondary | ICD-10-CM

## 2016-10-17 DIAGNOSIS — C44319 Basal cell carcinoma of skin of other parts of face: Secondary | ICD-10-CM | POA: Diagnosis not present

## 2016-10-31 ENCOUNTER — Other Ambulatory Visit: Payer: Self-pay | Admitting: Family Medicine

## 2016-11-01 ENCOUNTER — Ambulatory Visit
Admission: RE | Admit: 2016-11-01 | Discharge: 2016-11-01 | Disposition: A | Discharge status: Home / Self Care | Payer: Medicare Other | Source: Ambulatory Visit | Attending: Gastroenterology | Admitting: Gastroenterology

## 2016-11-01 ENCOUNTER — Encounter: Payer: Self-pay | Admitting: Family Medicine

## 2016-11-01 DIAGNOSIS — K5900 Constipation, unspecified: Secondary | ICD-10-CM | POA: Diagnosis not present

## 2016-11-01 DIAGNOSIS — K5790 Diverticulosis of intestine, part unspecified, without perforation or abscess without bleeding: Secondary | ICD-10-CM

## 2016-11-01 DIAGNOSIS — K5730 Diverticulosis of large intestine without perforation or abscess without bleeding: Secondary | ICD-10-CM | POA: Diagnosis not present

## 2016-11-01 IMAGING — CT CT VIRTUAL COLONOSCOPY DIAGNOSTIC
3 of 9 series · 11 of 36 positions shown, 17 images · non-contrast
Comparison: None.

CLINICAL DATA: IBS, constipation, diverticulitis. History of
prostate cancer status post microwave therapy.

EXAM:
CT VIRTUAL COLONOSCOPY DIAGNOSTIC
TECHNIQUE: The patient was given a standard Mag citrate bowel preparation with
Gastrografin and barium for fluid and stool tagging respectively.
The quality of the bowel preparation is moderate. Automated CO2
insufflation of the colon was performed prior to image acquisition
and colonic distention is moderate. Image post processing was used
to generate a 3D endoluminal fly-through projection of the colon and
to electronically subtract stool/fluid as appropriate.

[Series 2: supine (id) · axial · 0.84mm/px · z∈[-416,-54]mm · 5 of 436 slices shown, 10 images]
[im 73/436  soft-tissue]
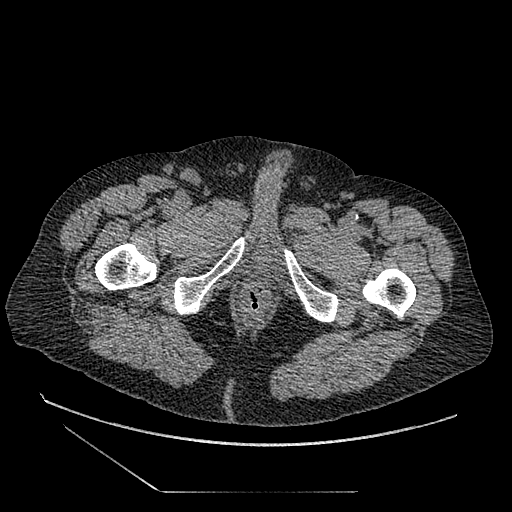
[im 73/436  bone]
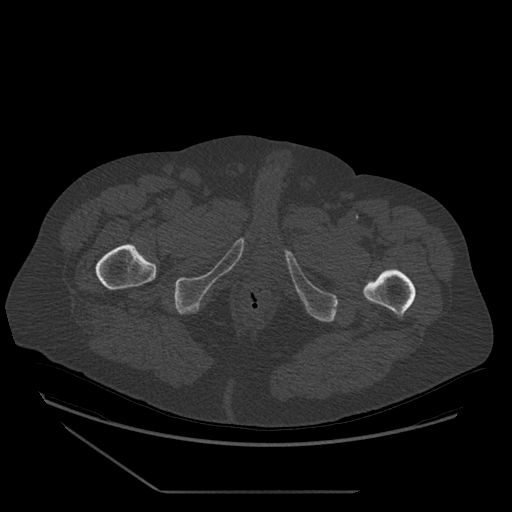
[im 146/436  soft-tissue]
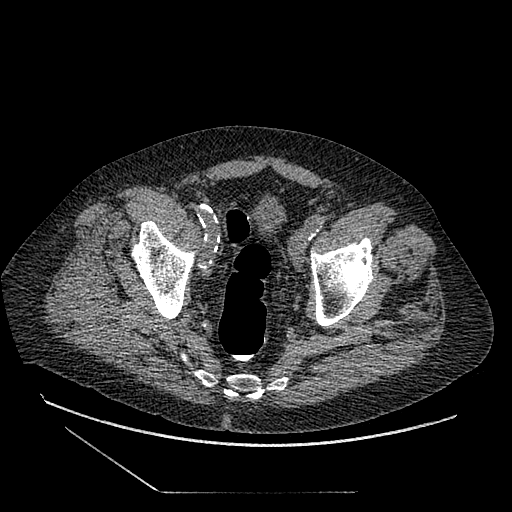
[im 146/436  lung]
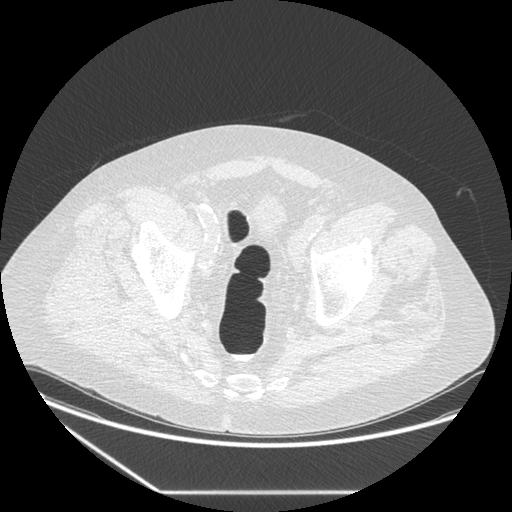
[im 218/436  soft-tissue]
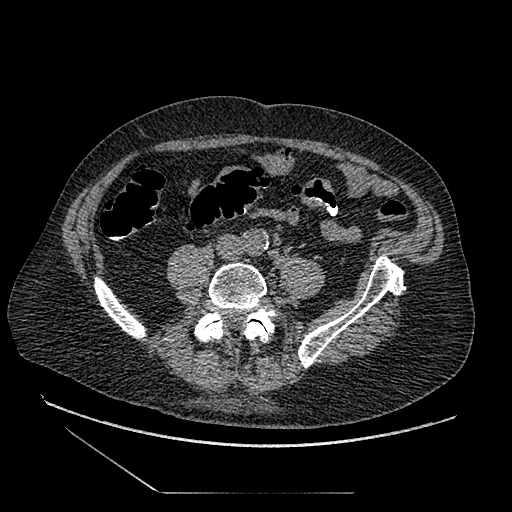
[im 218/436  lung]
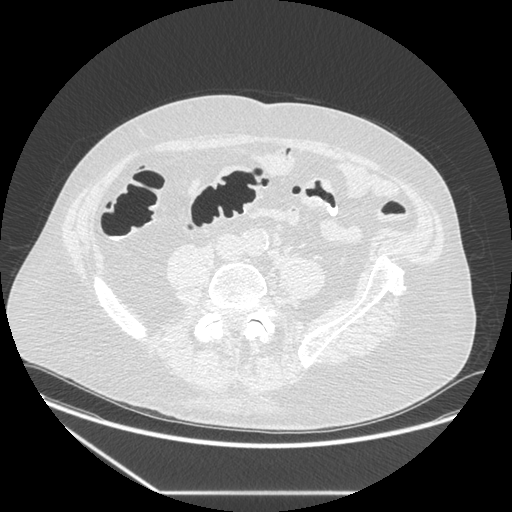
[im 291/436  soft-tissue]
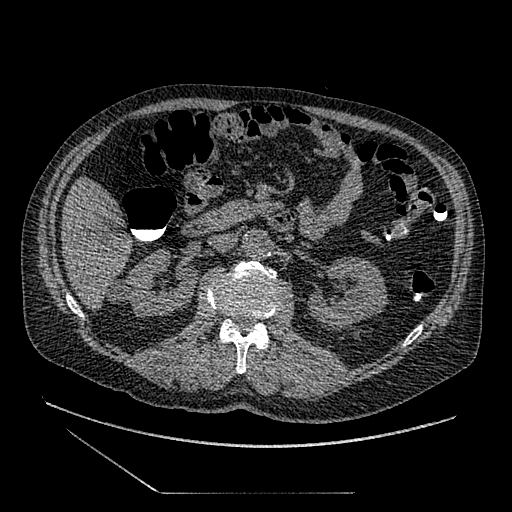
[im 291/436  lung]
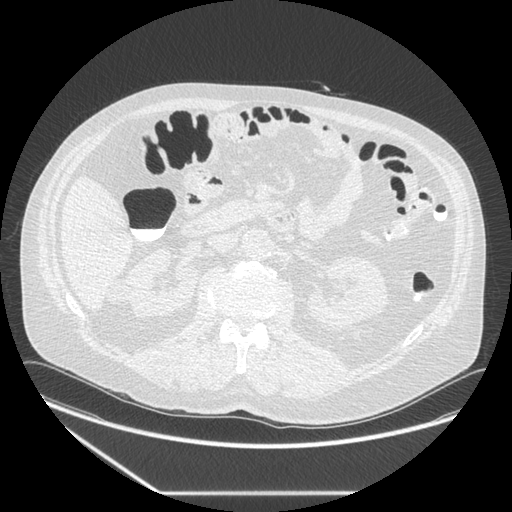
[im 363/436  soft-tissue]
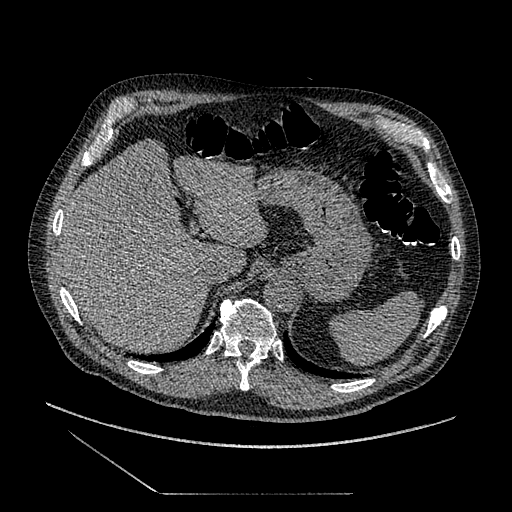
[im 363/436  lung]
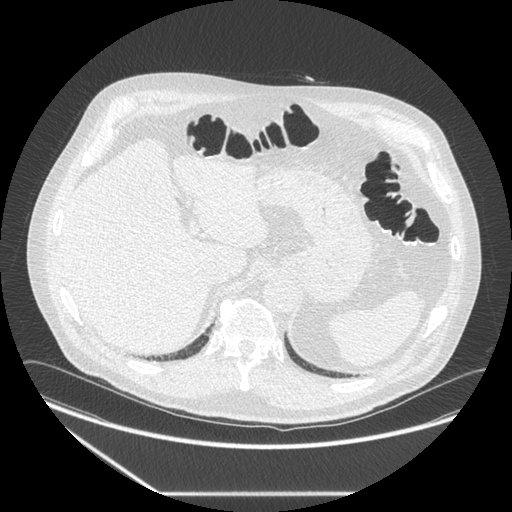

[Series 9: right decub 1.25 · axial · 0.85mm/px · z∈[-416,-46]mm · 5 of 445 slices shown]
[im 75/445  soft-tissue]
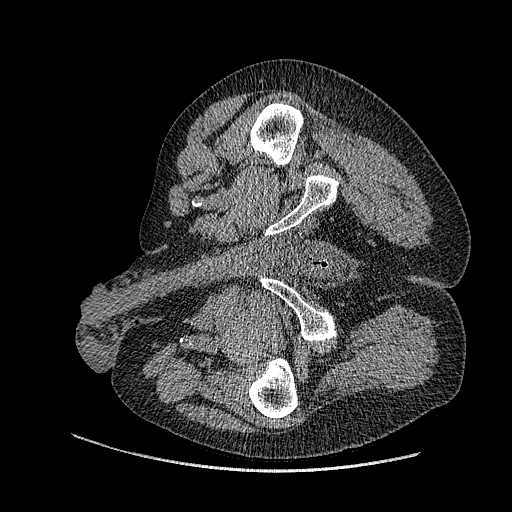
[im 149/445  soft-tissue]
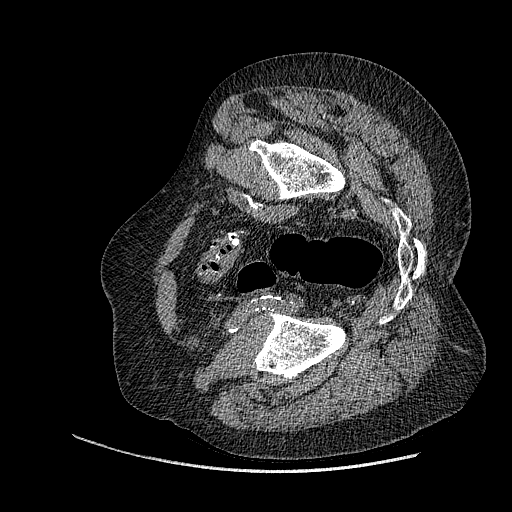
[im 223/445  soft-tissue]
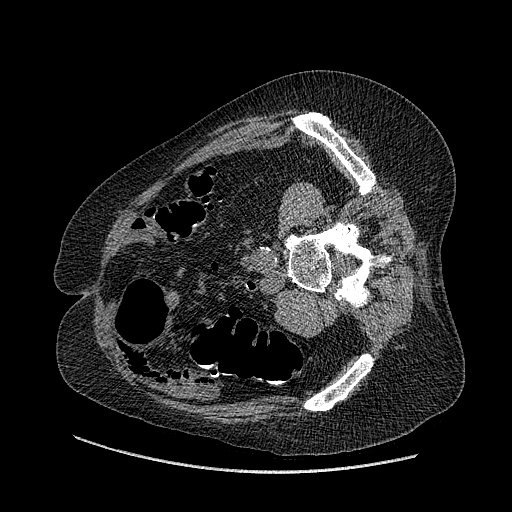
[im 297/445  soft-tissue]
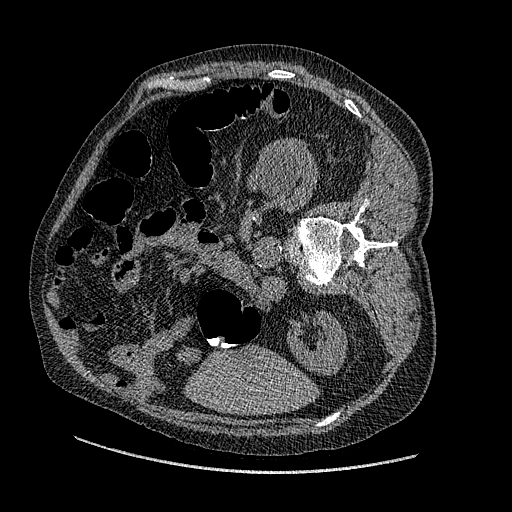
[im 371/445  soft-tissue]
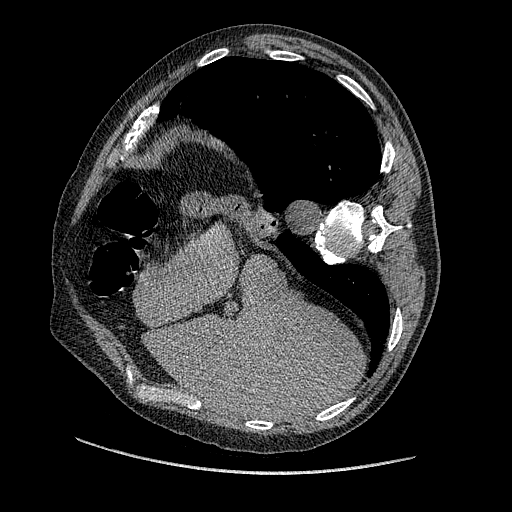

[Series 601: coronal body · coronal · 1.06mm/px · 1 of 138 slices shown, 2 images]
[im 46/138  soft-tissue]
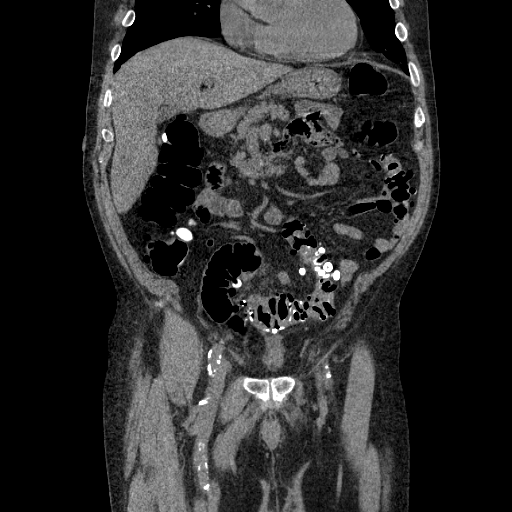
[im 46/138  bone]
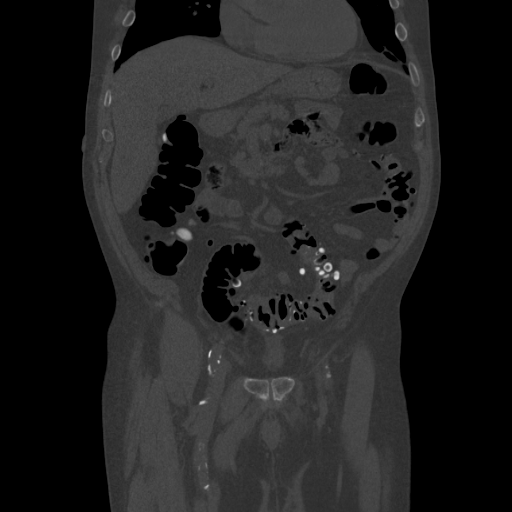

[11 of 36 positions shown; findings below may reference images not displayed]

FINDINGS: VIRTUAL COLONOSCOPY

Sigmoid colon is underdistended, particularly on supine imaging, but
better evaluated on prone and right lateral decubitus series.

No significant colonic polyp, mass, apple core lesion, or stricture.

Extensive sigmoid diverticulosis, without evidence of
diverticulitis.

No evidence of bowel obstruction.

Normal appendix (series 3/ image 104).

Virtual colonoscopy is not designed to detect diminutive polyps
(i.e., less than or equal to 5 mm), the presence or absence of which
may not affect clinical management.

CT ABDOMEN AND PELVIS WITHOUT CONTRAST

Lower chest: Lung bases are clear.

Hepatobiliary: Unenhanced liver is unremarkable.

Gallbladder is unremarkable. No intrahepatic or extrahepatic duct
dilatation.

Pancreas: Within normal limits.

Spleen: Within normal limits.

Adrenals/Urinary Tract: Adrenal glands are within normal limits.

Bilateral renal cysts, measuring up to 5.1 cm in the right upper
kidney. No renal calculi or hydronephrosis.

Bladder is thick-walled although underdistended.

Stomach/Bowel: Stomach is within normal limits.

Visualized bowel is described above.

Vascular/Lymphatic: No evidence of abdominal aortic aneurysm.

Atherosclerotic calcifications of the abdominal aorta and branch
vessels.

No suspicious abdominopelvic lymphadenopathy.

Reproductive: Brachytherapy seeds in the prostate.

Other: No abdominopelvic ascites.

Musculoskeletal: Degenerative changes of the visualized
thoracolumbar spine.

No focal osseous lesions.
IMPRESSION: No significant colonic polypoid lesion, mass, or stricture. Sigmoid
diverticulosis, without evidence of diverticulitis.

Brachytherapy seeds in the prostate. No evidence of recurrent or
metastatic disease. Otherwise unremarkable CT abdomen/pelvis.

## 2016-11-03 DIAGNOSIS — M7061 Trochanteric bursitis, right hip: Secondary | ICD-10-CM | POA: Diagnosis not present

## 2016-11-16 HISTORY — PX: COLON SURGERY: SHX602

## 2016-11-17 DIAGNOSIS — Z96651 Presence of right artificial knee joint: Secondary | ICD-10-CM | POA: Diagnosis not present

## 2016-12-05 ENCOUNTER — Telehealth: Payer: Self-pay | Admitting: Family Medicine

## 2016-12-05 ENCOUNTER — Telehealth: Payer: Self-pay | Admitting: *Deleted

## 2016-12-05 ENCOUNTER — Emergency Department: Payer: Medicare Other

## 2016-12-05 ENCOUNTER — Inpatient Hospital Stay
Admission: EM | Admit: 2016-12-05 | Discharge: 2016-12-14 | DRG: 330 | Disposition: A | Discharge status: Home Health | Payer: Medicare Other | Attending: Surgery | Admitting: Surgery

## 2016-12-05 ENCOUNTER — Encounter: Payer: Self-pay | Admitting: Emergency Medicine

## 2016-12-05 DIAGNOSIS — K5792 Diverticulitis of intestine, part unspecified, without perforation or abscess without bleeding: Secondary | ICD-10-CM | POA: Diagnosis present

## 2016-12-05 DIAGNOSIS — E876 Hypokalemia: Secondary | ICD-10-CM | POA: Diagnosis present

## 2016-12-05 DIAGNOSIS — J45909 Unspecified asthma, uncomplicated: Secondary | ICD-10-CM | POA: Diagnosis present

## 2016-12-05 DIAGNOSIS — Z79899 Other long term (current) drug therapy: Secondary | ICD-10-CM | POA: Diagnosis not present

## 2016-12-05 DIAGNOSIS — Z96651 Presence of right artificial knee joint: Secondary | ICD-10-CM | POA: Diagnosis present

## 2016-12-05 DIAGNOSIS — F329 Major depressive disorder, single episode, unspecified: Secondary | ICD-10-CM | POA: Diagnosis not present

## 2016-12-05 DIAGNOSIS — Z791 Long term (current) use of non-steroidal anti-inflammatories (NSAID): Secondary | ICD-10-CM | POA: Diagnosis not present

## 2016-12-05 DIAGNOSIS — R112 Nausea with vomiting, unspecified: Secondary | ICD-10-CM | POA: Diagnosis not present

## 2016-12-05 DIAGNOSIS — K5732 Diverticulitis of large intestine without perforation or abscess without bleeding: Secondary | ICD-10-CM | POA: Diagnosis not present

## 2016-12-05 DIAGNOSIS — R109 Unspecified abdominal pain: Secondary | ICD-10-CM | POA: Diagnosis not present

## 2016-12-05 DIAGNOSIS — K2970 Gastritis, unspecified, without bleeding: Secondary | ICD-10-CM | POA: Diagnosis not present

## 2016-12-05 DIAGNOSIS — Z923 Personal history of irradiation: Secondary | ICD-10-CM

## 2016-12-05 DIAGNOSIS — E860 Dehydration: Secondary | ICD-10-CM | POA: Diagnosis present

## 2016-12-05 DIAGNOSIS — N179 Acute kidney failure, unspecified: Secondary | ICD-10-CM | POA: Diagnosis present

## 2016-12-05 DIAGNOSIS — K5720 Diverticulitis of large intestine with perforation and abscess without bleeding: Secondary | ICD-10-CM | POA: Diagnosis not present

## 2016-12-05 DIAGNOSIS — F17220 Nicotine dependence, chewing tobacco, uncomplicated: Secondary | ICD-10-CM | POA: Diagnosis present

## 2016-12-05 DIAGNOSIS — K589 Irritable bowel syndrome without diarrhea: Secondary | ICD-10-CM | POA: Diagnosis present

## 2016-12-05 DIAGNOSIS — K5730 Diverticulosis of large intestine without perforation or abscess without bleeding: Secondary | ICD-10-CM | POA: Diagnosis not present

## 2016-12-05 DIAGNOSIS — Z86010 Personal history of colonic polyps: Secondary | ICD-10-CM | POA: Diagnosis not present

## 2016-12-05 DIAGNOSIS — Z8546 Personal history of malignant neoplasm of prostate: Secondary | ICD-10-CM | POA: Diagnosis not present

## 2016-12-05 DIAGNOSIS — K219 Gastro-esophageal reflux disease without esophagitis: Secondary | ICD-10-CM | POA: Diagnosis not present

## 2016-12-05 DIAGNOSIS — I10 Essential (primary) hypertension: Secondary | ICD-10-CM | POA: Diagnosis present

## 2016-12-05 DIAGNOSIS — E785 Hyperlipidemia, unspecified: Secondary | ICD-10-CM | POA: Diagnosis present

## 2016-12-05 DIAGNOSIS — R338 Other retention of urine: Secondary | ICD-10-CM | POA: Diagnosis not present

## 2016-12-05 DIAGNOSIS — Z9109 Other allergy status, other than to drugs and biological substances: Secondary | ICD-10-CM | POA: Diagnosis not present

## 2016-12-05 DIAGNOSIS — I4891 Unspecified atrial fibrillation: Secondary | ICD-10-CM | POA: Diagnosis present

## 2016-12-05 DIAGNOSIS — N358 Other urethral stricture: Secondary | ICD-10-CM | POA: Diagnosis not present

## 2016-12-05 DIAGNOSIS — Z86718 Personal history of other venous thrombosis and embolism: Secondary | ICD-10-CM

## 2016-12-05 DIAGNOSIS — Z91048 Other nonmedicinal substance allergy status: Secondary | ICD-10-CM | POA: Diagnosis not present

## 2016-12-05 DIAGNOSIS — F419 Anxiety disorder, unspecified: Secondary | ICD-10-CM | POA: Diagnosis not present

## 2016-12-05 DIAGNOSIS — R1030 Lower abdominal pain, unspecified: Secondary | ICD-10-CM | POA: Diagnosis not present

## 2016-12-05 LAB — COMPREHENSIVE METABOLIC PANEL
ALK PHOS: 74 U/L (ref 38–126)
ALT: 11 U/L — AB (ref 17–63)
AST: 24 U/L (ref 15–41)
Albumin: 3.9 g/dL (ref 3.5–5.0)
Anion gap: 9 (ref 5–15)
BUN: 16 mg/dL (ref 6–20)
CHLORIDE: 106 mmol/L (ref 101–111)
CO2: 25 mmol/L (ref 22–32)
CREATININE: 1.22 mg/dL (ref 0.61–1.24)
Calcium: 9.1 mg/dL (ref 8.9–10.3)
GFR calc Af Amer: >60 mL/min (ref >60)
GFR, EST NON AFRICAN AMERICAN: 56 mL/min — AB (ref >60)
Glucose, Bld: 154 mg/dL — ABNORMAL HIGH (ref 65–99)
Potassium: 3.3 mmol/L — ABNORMAL LOW (ref 3.5–5.1)
Sodium: 140 mmol/L (ref 135–145)
Total Bilirubin: 0.5 mg/dL (ref 0.3–1.2)
Total Protein: 7.9 g/dL (ref 6.5–8.1)

## 2016-12-05 LAB — LIPASE, BLOOD: LIPASE: 44 U/L (ref 11–51)

## 2016-12-05 LAB — CBC
HCT: 41.8 % (ref 40.0–52.0)
Hemoglobin: 13.6 g/dL (ref 13.0–18.0)
MCH: 27.6 pg (ref 26.0–34.0)
MCHC: 32.5 g/dL (ref 32.0–36.0)
MCV: 85 fL (ref 80.0–100.0)
PLATELETS: 276 10*3/uL (ref 150–440)
RBC: 4.92 MIL/uL (ref 4.40–5.90)
RDW: 15.3 % — AB (ref 11.5–14.5)
WBC: 5.3 10*3/uL (ref 3.8–10.6)

## 2016-12-05 LAB — TROPONIN I: Troponin I: <0.03 ng/mL (ref <0.03)

## 2016-12-05 IMAGING — CT CT RENAL STONE PROTOCOL
2 of 4 series · 16 of 46 positions shown, 18 images · non-contrast
Comparison: CT abdomen and pelvis dated [DATE].

CLINICAL DATA: Diffuse abdominal pain.

EXAM:
CT ABDOMEN AND PELVIS WITHOUT CONTRAST
TECHNIQUE: Multidetector CT imaging of the abdomen and pelvis was performed
following the standard protocol without IV contrast.

[Series 2: stone full standard · axial · 0.79mm/px · z∈[-822,-362]mm · 13 of 100 slices shown, 15 images]
[im 4/100  soft-tissue]
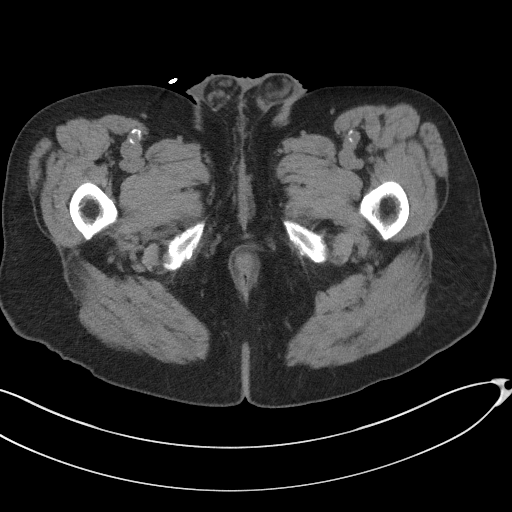
[im 4/100  bone]
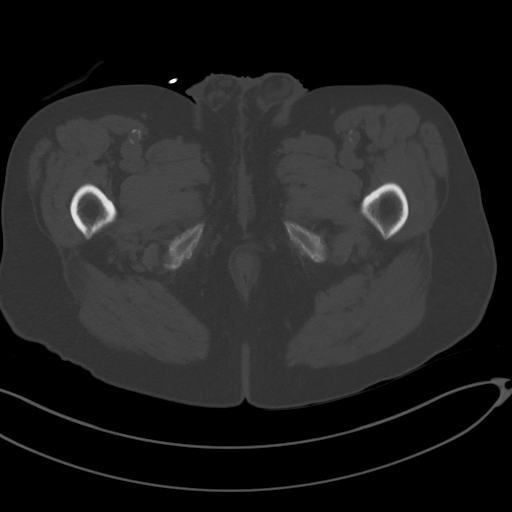
[im 12/100  soft-tissue]
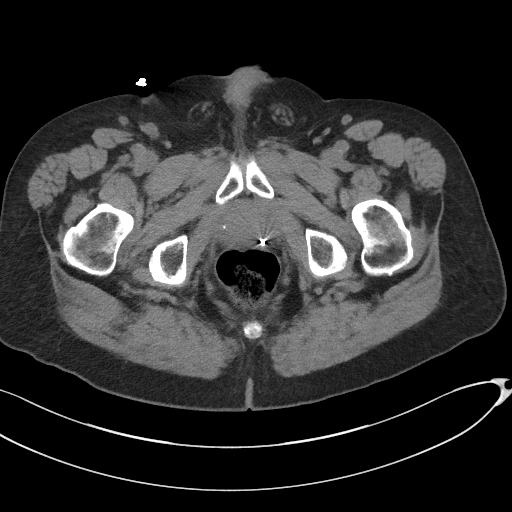
[im 20/100  soft-tissue]
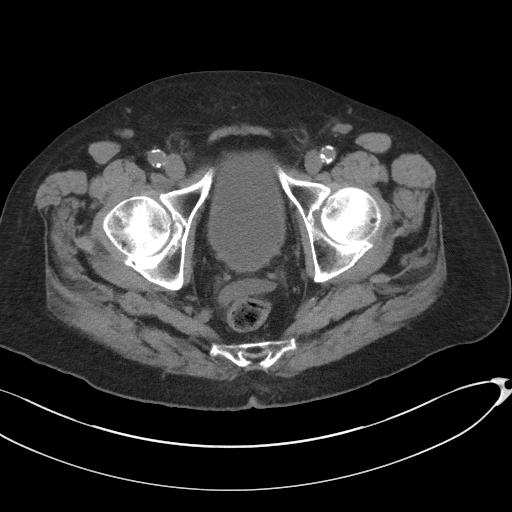
[im 28/100  soft-tissue]
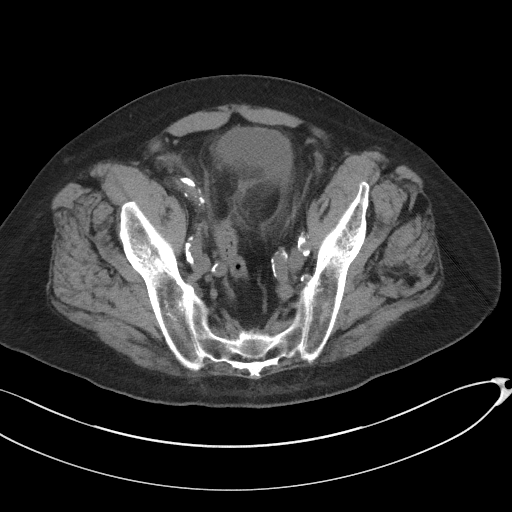
[im 36/100  soft-tissue]
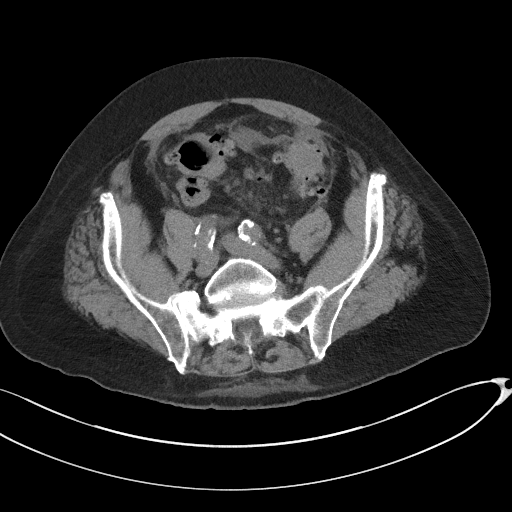
[im 44/100  soft-tissue]
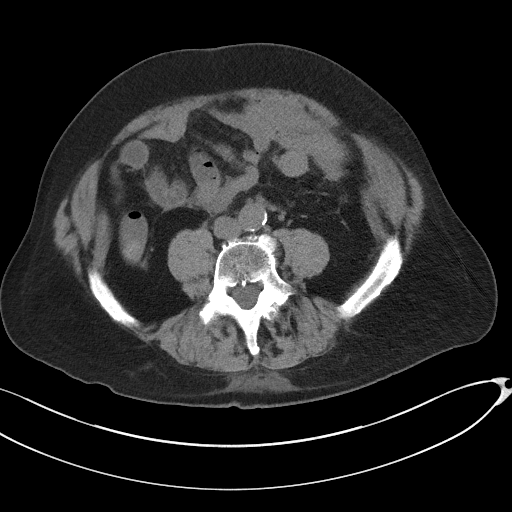
[im 52/100  soft-tissue]
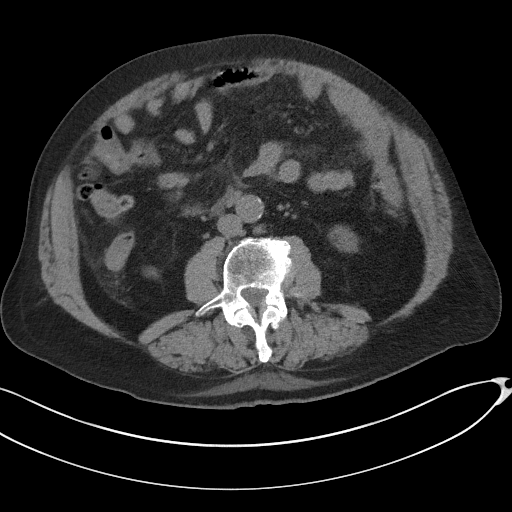
[im 56/100  soft-tissue]
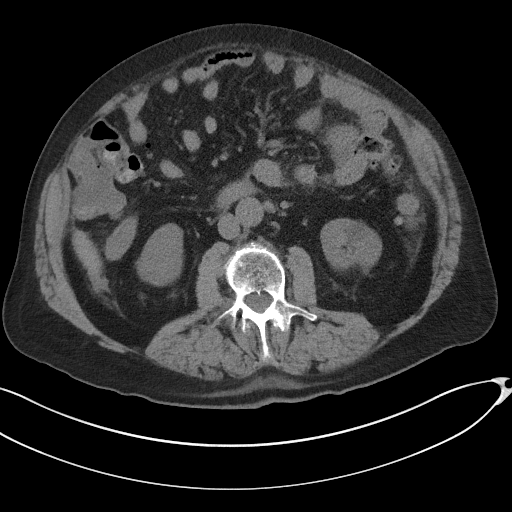
[im 64/100  soft-tissue]
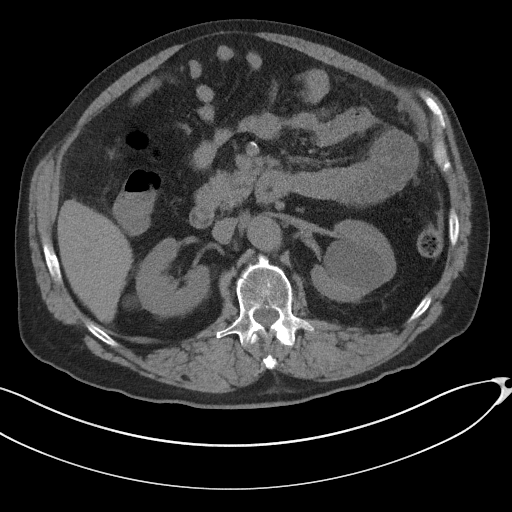
[im 64/100  bone]
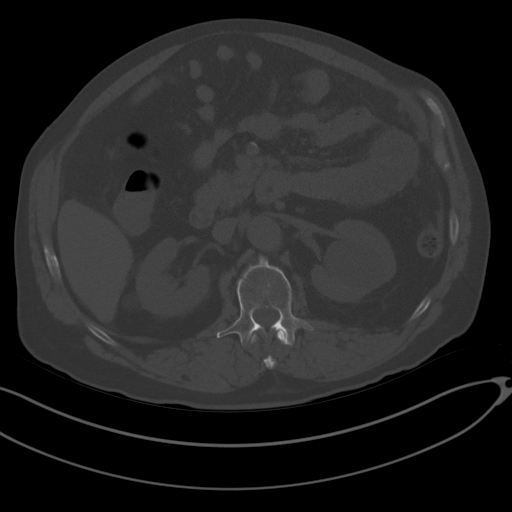
[im 72/100  soft-tissue]
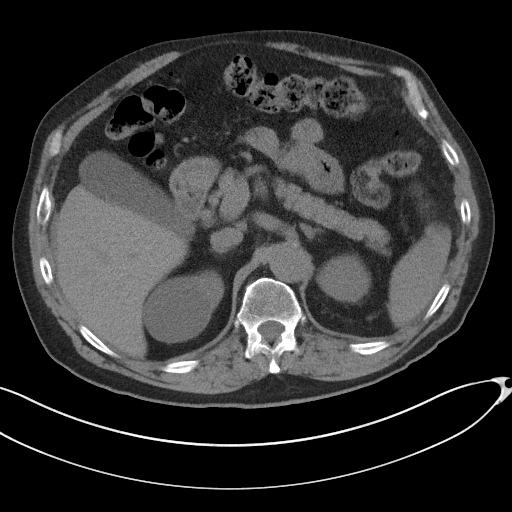
[im 80/100  soft-tissue]
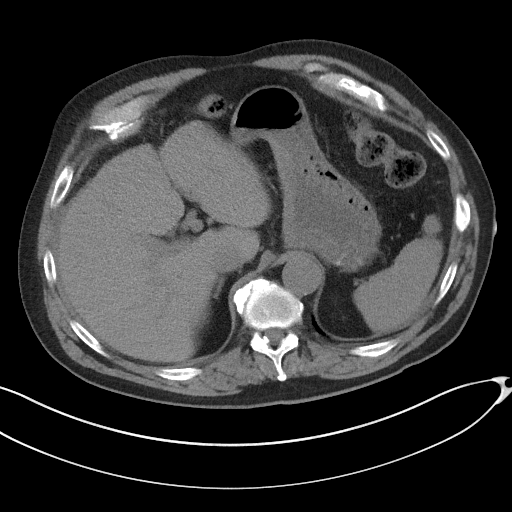
[im 88/100  soft-tissue]
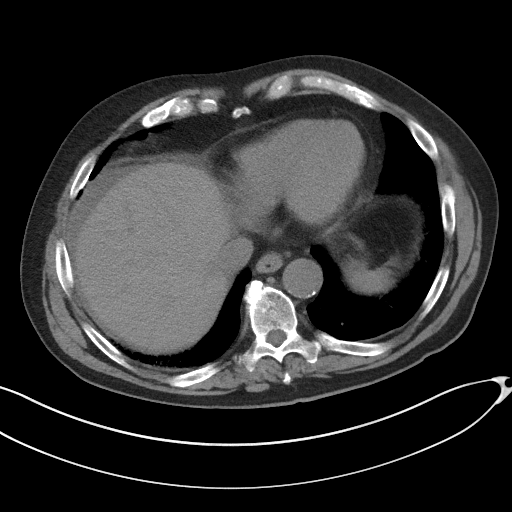
[im 96/100  soft-tissue]
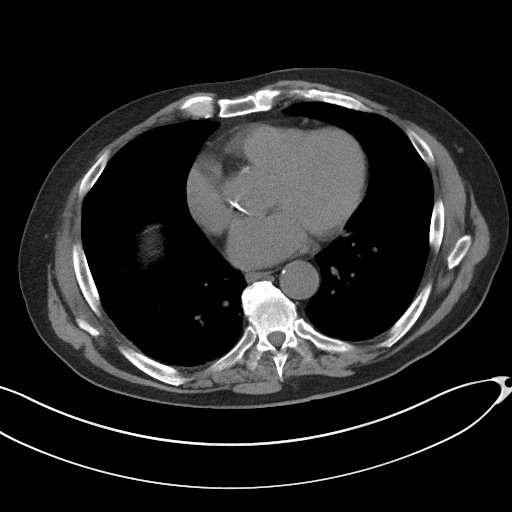

[Series 5: coronal · coronal · 0.78mm/px · 3 of 159 slices shown]
[im 53/159  soft-tissue]
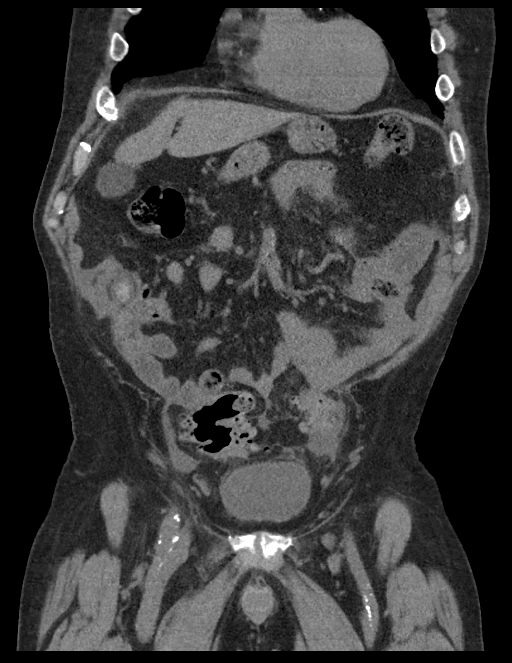
[im 71/159  soft-tissue]
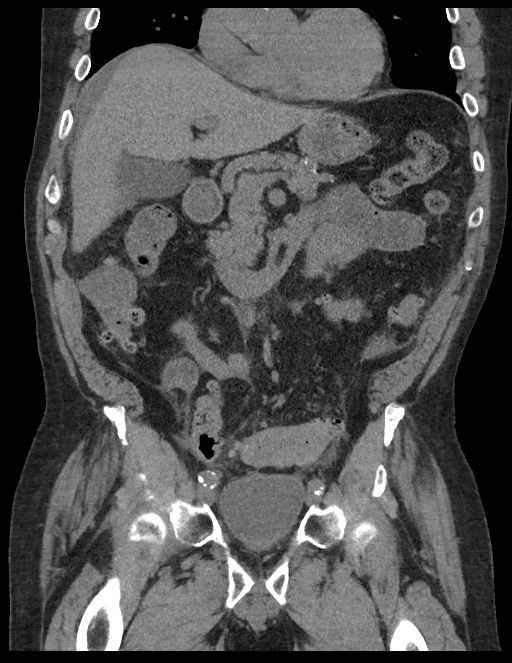
[im 88/159  soft-tissue]
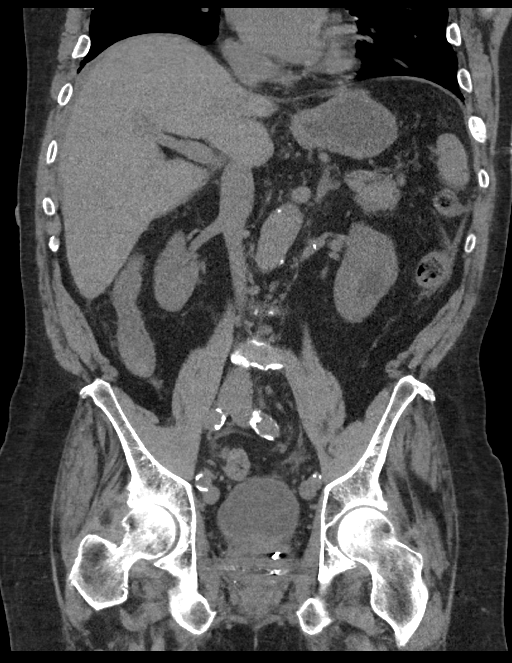

[16 of 46 positions shown; findings below may reference images not displayed]

FINDINGS: Lower chest: No acute abnormality. Coronary artery atherosclerotic
vascular calcifications.

Hepatobiliary: No focal liver abnormality is seen. No gallstones,
gallbladder wall thickening, or biliary dilatation.

Pancreas: Unremarkable. No pancreatic ductal dilatation or
surrounding inflammatory changes.

Spleen: Normal in size without focal abnormality.

Adrenals/Urinary Tract: Adrenal glands are unremarkable. Multiple
bilateral renal cysts are unchanged. No renal calculi, focal lesion,
or hydronephrosis. Bladder is unremarkable.

Stomach/Bowel: Extensive colonic diverticulosis with long segment
sigmoid colon wall thickening and pericolonic fat stranding and
fluid, consistent with diverticulitis. There are a few small foci of
free air. No drainable fluid collection. The appendix is normal. The
stomach and small bowel are within normal limits.

Vascular/Lymphatic: Aortic atherosclerosis. No enlarged abdominal or
pelvic lymph nodes.

Reproductive: Unchanged brachytherapy seeds in the prostate gland.

Other: Small amount of free fluid in the pelvis and adjacent to the
liver.

Musculoskeletal: No acute or significant osseous findings.
Degenerative changes of the lumbar spine.
IMPRESSION: 1. Findings consistent with sigmoid diverticulitis with probable
microperforation. No drainable fluid collection.
2.  Aortic atherosclerosis ([NM]-[NM]).

## 2016-12-05 MED ORDER — HYDROMORPHONE HCL 1 MG/ML IJ SOLN
INTRAMUSCULAR | Status: AC
  Filled 2016-12-05: qty 1

## 2016-12-05 MED ORDER — FENTANYL CITRATE (PF) 100 MCG/2ML IJ SOLN
50.0000 ug | INTRAMUSCULAR | Status: DC | PRN
Start: 2016-12-05 — End: 2016-12-05
  Administered 2016-12-05: 50 ug via INTRAVENOUS
  Filled 2016-12-05: qty 2

## 2016-12-05 MED ORDER — DIPHENHYDRAMINE HCL 12.5 MG/5ML PO ELIX
12.5000 mg | ORAL_SOLUTION | Freq: Four times a day (QID) | ORAL | Status: DC | PRN
  Filled 2016-12-05: qty 5

## 2016-12-05 MED ORDER — ONDANSETRON HCL 4 MG/2ML IJ SOLN
4.0000 mg | Freq: Four times a day (QID) | INTRAMUSCULAR | Status: DC | PRN
  Administered 2016-12-10: 4 mg via INTRAVENOUS
  Filled 2016-12-05: qty 2

## 2016-12-05 MED ORDER — DEXTROSE IN LACTATED RINGERS 5 % IV SOLN
INTRAVENOUS | Status: DC
  Administered 2016-12-05 – 2016-12-12 (×19): via INTRAVENOUS
  Administered 2016-12-12: 1000 mL via INTRAVENOUS
  Administered 2016-12-13 – 2016-12-14 (×4): via INTRAVENOUS

## 2016-12-05 MED ORDER — MORPHINE SULFATE (PF) 4 MG/ML IV SOLN
4.0000 mg | Freq: Once | INTRAVENOUS | Status: AC
  Administered 2016-12-05: 4 mg via INTRAVENOUS
  Filled 2016-12-05: qty 1

## 2016-12-05 MED ORDER — AMLODIPINE BESYLATE 5 MG PO TABS
5.0000 mg | ORAL_TABLET | Freq: Every evening | ORAL | Status: DC
  Administered 2016-12-06 – 2016-12-13 (×7): 5 mg via ORAL
  Filled 2016-12-05 (×7): qty 1

## 2016-12-05 MED ORDER — ONDANSETRON 4 MG PO TBDP: 4.0000 mg | ORAL_TABLET | Freq: Four times a day (QID) | ORAL | Status: DC | PRN

## 2016-12-05 MED ORDER — PIPERACILLIN-TAZOBACTAM 3.375 G IVPB 30 MIN
3.3750 g | Freq: Once | INTRAVENOUS | Status: AC
  Administered 2016-12-05: 3.375 g via INTRAVENOUS

## 2016-12-05 MED ORDER — HYDRALAZINE HCL 20 MG/ML IJ SOLN: 10.0000 mg | INTRAMUSCULAR | Status: DC | PRN

## 2016-12-05 MED ORDER — PANTOPRAZOLE SODIUM 40 MG IV SOLR
40.0000 mg | Freq: Every day | INTRAVENOUS | Status: DC
  Administered 2016-12-05 – 2016-12-13 (×9): 40 mg via INTRAVENOUS
  Filled 2016-12-05 (×9): qty 40

## 2016-12-05 MED ORDER — ENOXAPARIN SODIUM 40 MG/0.4ML ~~LOC~~ SOLN
40.0000 mg | SUBCUTANEOUS | Status: DC
  Administered 2016-12-05: 40 mg via SUBCUTANEOUS
  Filled 2016-12-05: qty 0.4

## 2016-12-05 MED ORDER — DIPHENHYDRAMINE HCL 50 MG/ML IJ SOLN: 12.5000 mg | Freq: Four times a day (QID) | INTRAMUSCULAR | Status: DC | PRN

## 2016-12-05 MED ORDER — SERTRALINE HCL 50 MG PO TABS
50.0000 mg | ORAL_TABLET | Freq: Every day | ORAL | Status: DC
  Administered 2016-12-06 – 2016-12-14 (×9): 50 mg via ORAL
  Filled 2016-12-05 (×9): qty 1

## 2016-12-05 MED ORDER — ONDANSETRON HCL 4 MG/2ML IJ SOLN
4.0000 mg | Freq: Once | INTRAMUSCULAR | Status: AC
  Administered 2016-12-05: 4 mg via INTRAVENOUS
  Filled 2016-12-05: qty 2

## 2016-12-05 MED ORDER — METOPROLOL TARTRATE 5 MG/5ML IV SOLN: 5.0000 mg | Freq: Four times a day (QID) | INTRAVENOUS | Status: DC | PRN

## 2016-12-05 MED ORDER — PIPERACILLIN-TAZOBACTAM 3.375 G IVPB
3.3750 g | Freq: Three times a day (TID) | INTRAVENOUS | Status: DC
  Administered 2016-12-06 – 2016-12-14 (×24): 3.375 g via INTRAVENOUS
  Filled 2016-12-05 (×24): qty 50

## 2016-12-05 MED ORDER — HYDROMORPHONE HCL 1 MG/ML IJ SOLN
0.5000 mg | INTRAMUSCULAR | Status: DC | PRN
  Administered 2016-12-05: 0.5 mg via INTRAVENOUS

## 2016-12-05 MED ORDER — PIPERACILLIN-TAZOBACTAM 3.375 G IVPB 30 MIN
INTRAVENOUS | Status: AC
  Administered 2016-12-05: 3.375 g via INTRAVENOUS
  Filled 2016-12-05: qty 50

## 2016-12-05 MED ORDER — METOPROLOL SUCCINATE ER 25 MG PO TB24
25.0000 mg | ORAL_TABLET | Freq: Every day | ORAL | Status: DC
  Administered 2016-12-07 – 2016-12-14 (×8): 25 mg via ORAL
  Filled 2016-12-05 (×8): qty 1

## 2016-12-05 NOTE — H&P (Signed)
Patient ID: Cameron Foster., male   DOB: [birth year], 76 y.o.   MRN: [id number]  CC: Diverticulitis  HPI Cameron Foster. is a 76 y.o. male presents to the ER today for evaluation of acute onset abdominal pain. Patient reports this is unlike any pain he's had before. He has had bouts of diverticulitis before but states the pain was never this severe. Per the wife the pain started last night and his lower abdomen and gradually worsened until he came to the ER. Nothing makes the pain better or worse. Patient appears sedated during my consultation as woken up numerous times obtaining his history. He denies any fevers, chills, chest pain, short of breath, vomiting, diarrhea, constipation. He has had some subjective nausea but no vomiting. Denies any recent changes in health is otherwise in his usual state of being.  HPI  Past Medical History:  Diagnosis Date  . Alcoholism ([place])   . Arrhythmia   . Arthritis   . Asthma   . Atrial fibrillation ([place])    one episode  . Colon polyp   . Depression   . Diverticulitis   . Diverticulosis 30 years  . Fainting   . GERD (gastroesophageal reflux disease)   . History of blood clots   . Hyperlipidemia   . Hypertension   . Irritable bowel syndrome   . Prostate cancer ([place]) 2012   treated with radiation therapy.  . Vasovagal syncope   . Vertigo     Past Surgical History:  Procedure Laterality Date  . CARDIAC CATHETERIZATION     [place] no stents  . CATARACT EXTRACTION, BILATERAL    . COLONOSCOPY  2016  . COLONOSCOPY WITH PROPOFOL N/A 05/30/2016   Procedure: COLONOSCOPY WITH PROPOFOL;  Surgeon: Lollie Sails, MD;  Location: [facility] ENDOSCOPY;  Service: Endoscopy;  Laterality: N/A;  . ESOPHAGOGASTRODUODENOSCOPY    . ESOPHAGOGASTRODUODENOSCOPY N/A 05/30/2016   Procedure: ESOPHAGOGASTRODUODENOSCOPY (EGD);  Surgeon: Lollie Sails, MD;  Location: [facility] ENDOSCOPY;  Service: Endoscopy;  Laterality: N/A;  . FRACTURE SURGERY Bilateral    right  arm and left wrist  . KNEE ARTHROSCOPY    . PROSTATE SURGERY     Microwave therapy  . TONSILLECTOMY    . TOTAL KNEE ARTHROPLASTY Right 07/27/2016   Procedure: TOTAL KNEE ARTHROPLASTY;  Surgeon: [name], MD;  Location: [facility] ORS;  Service: Orthopedics;  Laterality: Right;  Dr. Erlene Quan had to place Urinary catheter due to prostate cancer history.  Using flexible scope.    Family History  Problem Relation Age of Onset  . Lung cancer Father   . Bipolar disorder Son   . Sudden death Son        due to drug over dose and cardiac issues  . Prostate cancer Neg Hx   . Bladder Cancer Neg Hx     Social History Social History  Substance Use Topics  . Smoking status: Former Smoker    Years: 27.00    Quit date: 04/18/1988  . Smokeless tobacco: Current User    Types: Chew  . Alcohol use 0.6 oz/week    1 Standard drinks or equivalent per week     Comment: former.  occ.    Allergies  Allergen Reactions  . Formaldehyde Rash and Other (See Comments)  . Tape Rash    Current Facility-Administered Medications  Medication Dose Route Frequency Provider Last Rate Last Dose  . fentaNYL (SUBLIMAZE) injection 50 mcg  50 mcg Intravenous Q30 min PRN Schuyler Amor, MD  50 mcg at 12/05/16 1155  . piperacillin-tazobactam (ZOSYN) IVPB 3.375 g  3.375 g Intravenous Once Schuyler Amor, MD 100 mL/hr at 12/05/16 1550 3.375 g at 12/05/16 1550   Current Outpatient Prescriptions  Medication Sig Dispense Refill  . amLODipine (NORVASC) 5 MG tablet Take 1 tablet (5 mg total) by mouth daily. (Patient taking differently: Take 5 mg by mouth every evening. ) 90 tablet 3  . gabapentin (NEURONTIN) 400 MG capsule Take 1 capsule by mouth 2 (two) times daily.    Marland Kitchen HYDROcodone-acetaminophen (NORCO) 7.5-325 MG tablet Take 1 tablet by mouth every 6 (six) hours as needed for moderate pain. 50 tablet 0  . hydrocortisone 2.5 % cream APPLY  CREAM TO AFFECTED AREA TWICE DAILY AS NEEDED FOR  RASH 28 g 1  . lisinopril  (PRINIVIL,ZESTRIL) 10 MG tablet Take 1 tablet (10 mg total) by mouth daily. 90 tablet 3  . meloxicam (MOBIC) 15 MG tablet Take 1 tablet (15 mg total) by mouth daily. 30 tablet 3  . metoprolol succinate (TOPROL-XL) 25 MG 24 hr tablet Take 1 tablet (25 mg total) by mouth daily. 90 tablet 3  . pantoprazole (PROTONIX) 40 MG tablet Take 40 mg by mouth daily.     . polyethylene glycol powder (GLYCOLAX/MIRALAX) powder Take 17 g by mouth 2 (two) times daily as needed. (Patient taking differently: Take 17 g by mouth daily. ) 3350 g 1  . sertraline (ZOLOFT) 50 MG tablet Take 1 tablet (50 mg total) by mouth daily. (Patient taking differently: Take 50 mg by mouth at bedtime. ) 90 tablet 3  . sucralfate (CARAFATE) 1 g tablet Take 1 g by mouth 4 (four) times daily -  with meals and at bedtime.     . sertraline (ZOLOFT) 50 MG tablet TAKE ONE TABLET BY MOUTH ONCE DAILY (Patient not taking: Reported on 12/05/2016) 90 tablet 3     Review of Systems A Multi-point review of systems was asked and was negative except for the findings documented in the present illness  Physical Exam Blood pressure (!) 167/94, pulse 81, temperature 98.1 F (36.7 C), temperature source Oral, resp. rate 20, height 5' 9"  (1.753 m), weight 90.7 kg (200 lb), SpO2 96 %. CONSTITUTIONAL: Resting in bed in no acute distress. EYES: Pupils are equal, round, and reactive to light, Sclera are non-icteric. EARS, NOSE, MOUTH AND THROAT: The oropharynx is clear. The oral mucosa is pink and moist. Hearing is intact to voice. LYMPH NODES:  Lymph nodes in the neck are normal. RESPIRATORY:  Lungs are clear. There is normal respiratory effort, with equal breath sounds bilaterally, and without pathologic use of accessory muscles. CARDIOVASCULAR: Heart is regular without murmurs, gallops, or rubs. GI: The abdomen is soft, tender to palpation in bilateral lower quadrants left greater than right, no rebound or guarding, and nondistended. There are no palpable  masses. There is no hepatosplenomegaly. There are normal bowel sounds in all quadrants. GU: Rectal deferred.   MUSCULOSKELETAL: Normal muscle strength and tone. No cyanosis or edema.   SKIN: Turgor is good and there are no pathologic skin lesions or ulcers. NEUROLOGIC: Motor and sensation is grossly normal. Cranial nerves are grossly intact. PSYCH:  Oriented to person, place and time. Affect is normal.  Data Reviewed Images and labs reviewed. Labs are all within normal limits including a normal [place] blood cell count. CT scan of the abdomen does show extensive sigmoid diverticulitis. Report from the radiologist of small dots of air outside the lumen but not  appreciated on my own review. I have personally reviewed the patient's imaging, laboratory findings and medical records.    Assessment    Diverticulitis with possible microperforation    Plan    76 year old male with recurrent diverticulitis read this is his wors presentation of this disease. Discussed with the patient and his wife the rationale for admission to the hospital, bowel rest, IV antibiotics and pain control. Discussed the likelihood of resolution of his symptoms with antibiotics. Also discussed that should his symptoms not improve or should they worsen in spite of antibiotics then operative intervention would then be indicated. Discussed the likelihood of a colostomy should he have to perform an urgent operation. They voice understanding and agree with this plan. Plan for admission to the general surgery service.     Time spent with the patient was 50 minutes, with more than 50% of the time spent in face-to-face education, counseling and care coordination.     Clayburn Pert, MD FACS General Surgeon 12/05/2016, 4:19 PM

## 2016-12-05 NOTE — ED Provider Notes (Signed)
[facility] Emergency Department Provider Note  ____________________________________________   I have reviewed the triage vital signs and the nursing notes.   HISTORY  Chief Complaint Abdominal Pain    HPI Cameron Math. is a 76 y.o. male with multiple medical problems presents today complaining of lower abdominal pain since last night. He does have a history of urinary retention. He states he thinks he may been urinating less frequen today. He denies any fever or chills. He had some dry heaves. No diarrhea. No bleeding. No melena. He can't recall the last normal bowel movement however.  Pain is sharp, nothing makes it better, nothing makes it worse. He does have a history of diverticulitis.  Past Medical History:  Diagnosis Date  . Alcoholism ([place])   . Arrhythmia   . Arthritis   . Asthma   . Atrial fibrillation ([place])    one episode  . Colon polyp   . Depression   . Diverticulitis   . Diverticulosis 30 years  . Fainting   . GERD (gastroesophageal reflux disease)   . History of blood clots   . Hyperlipidemia   . Hypertension   . Irritable bowel syndrome   . Prostate cancer ([place]) 2012   treated with radiation therapy.  . Vasovagal syncope   . Vertigo     Patient Active Problem List   Diagnosis Date Noted  . History of Moh's micrographic surgery for skin cancer 09/07/2016  . Vasovagal syncope 01/27/2016  . Abdominal bloating 01/01/2016  . Actinic keratoses 10/28/2015  . Osteoarthritis of right knee 10/01/2015  . Essential hypertension 08/25/2015  . Chronic abdominal pain 08/25/2015  . Anxiety 08/25/2015  . Exertional dyspnea 08/12/2015  . Gross hematuria 08/04/2015  . Erectile dysfunction following radiation therapy 08/04/2015  . Urinary retention 08/03/2015  . Myalgia 08/02/2015  . Constipation 08/02/2015  . History of prostate cancer 01/20/2015  . Skin nodule 01/20/2015  . Swelling of right lower extremity 01/20/2015    Past  Surgical History:  Procedure Laterality Date  . CARDIAC CATHETERIZATION     [place] no stents  . CATARACT EXTRACTION, BILATERAL    . COLONOSCOPY  2016  . COLONOSCOPY WITH PROPOFOL N/A 05/30/2016   Procedure: COLONOSCOPY WITH PROPOFOL;  Surgeon: Lollie Sails, MD;  [facility] END[facility]COPY;  Service: Endoscopy;  Laterality: N/A;  . ESOPHAGOGASTRODUODENOSCOPY    . ESOPHAGOGASTRODUODENOSCOPY N/A 05/30/2016   Procedure: ESOPHAGOGASTRODUODENOSCOPY (EGD);  Surgeon: Lollie Sails, MD;  Location: [facility] ENDOSCOPY;  Service: Endoscopy;  Laterality: N/A;  . FRACTURE SURGERY Bilateral    right arm and left wrist  . KNEE ARTHROSCOPY    . PROSTATE SURGERY     Microwave therapy  . TONSILLECTOMY    . TOTAL KNEE ARTHROPLASTY Right 07/27/2016   Procedure: TOTAL KNEE ARTHROPLASTY;  Surgeon: [name], MD;  Location: [facility] ORS;  Service: Orthopedics;  Laterality: Right;  Dr. Erlene Quan had to place Urinary catheter due to prostate cancer history.  Using flexible scope.    Prior to Admission medications   Medication Sig Start Date End Date Taking? Authorizing Provider  amLODipine (NORVASC) 5 MG tablet Take 1 tablet (5 mg total) by mouth daily. Patient taking differently: Take 5 mg by mouth every evening.  02/10/16   Leone Haven, MD  HYDROcodone-acetaminophen (NORCO) 7.5-325 MG tablet Take 1 tablet by mouth every 6 (six) hours as needed for moderate pain. 07/29/16   [name], MD  hydrocortisone 2.5 % cream APPLY  CREAM TO AFFECTED AREA TWICE  DAILY AS NEEDED FOR  RASH 11/01/16   Leone Haven, MD  lisinopril (PRINIVIL,ZESTRIL) 10 MG tablet Take 1 tablet (10 mg total) by mouth daily. 02/10/16   Leone Haven, MD  meloxicam (MOBIC) 15 MG tablet Take 1 tablet (15 mg total) by mouth daily. 07/29/16   [name], MD  metoprolol succinate (TOPROL-XL) 25 MG 24 hr tablet Take 1 tablet (25 mg total) by mouth daily. 02/10/16   Leone Haven, MD  pantoprazole (PROTONIX) 40 MG  tablet Take by mouth. 07/14/16 07/14/17  [provider]  polyethylene glycol powder (GLYCOLAX/MIRALAX) powder Take 17 g by mouth 2 (two) times daily as needed. Patient taking differently: Take 17 g by mouth daily.  08/03/15   Leone Haven, MD  sertraline (ZOLOFT) 50 MG tablet Take 1 tablet (50 mg total) by mouth daily. Patient taking differently: Take 50 mg by mouth at bedtime.  02/10/16   Leone Haven, MD  sertraline (ZOLOFT) 50 MG tablet TAKE ONE TABLET BY MOUTH ONCE DAILY 09/26/16   Leone Haven, MD  sucralfate (CARAFATE) 1 g tablet  08/06/16   [provider]    Allergies Formaldehyde and Tape  Family History  Problem Relation Age of Onset  . Lung cancer Father   . Bipolar disorder Son   . Sudden death Son        due to drug over dose and cardiac issues  . Prostate cancer Neg Hx   . Bladder Cancer Neg Hx     Social History Social History  Substance Use Topics  . Smoking status: Former Smoker    Years: 27.00    Quit date: 04/18/1988  . Smokeless tobacco: Current User    Types: Chew  . Alcohol use 0.6 oz/week    1 Standard drinks or equivalent per week     Comment: former.  occ.    Review of Systems Constitutional: No fever/chills Eyes: No visual changes. ENT: No sore throat. No stiff neck no neck pain Cardiovascular: Denies chest pain. Respiratory: Denies shortness of breath. Gastrointestinal:   no vomiting.  No diarrhea.  No constipation. Genitourinary: Negative for dysuria. Musculoskeletal: Negative lower extremity swelling Skin: Negative for rash. Neurological: Negative for severe headaches, focal weakness or numbness.   ____________________________________________   PHYSICAL EXAM:  VITAL SIGNS: ED Triage Vitals [12/05/16 1132]  Enc Vitals Group     BP (!) 167/94     Pulse Rate 81     Resp 20     Temp 98.1 F (36.7 C)     Temp Source Oral     SpO2 96 %     Weight 200 lb (90.7 kg)     Height 5' 9"  (1.753 m)     Head  Circumference      Peak Flow      Pain Score 10     Pain Loc      Pain Edu?      Excl. in [place]?     Constitutional: Alert and oriented. Well appearing and in no acute distress. Eyes: Conjunctivae are normal Head: Atraumatic HEENT: No congestion/rhinnorhea. Mucous membranes are moist.  Oropharynx non-erythematous Neck:   Nontender with no meningismus, no masses, no stridor Cardiovascular: Normal rate, regular rhythm. Grossly normal heart sounds.  Good peripheral circulation. Respiratory: Normal respiratory effort.  No retractions. Lungs CTAB. Abdominal: Soft and ndiffusely tender especially in the lower abdomen, no rebound, positive voluntary guarding Back:  There is no focal tenderness or step off.  there is no midline tenderness there are no lesions noted. there is no CVA tenderness Normal external male genitalia Musculoskeletal: No lower extremity tenderness, no upper extremity tenderness. No joint effusions, no DVT signs strong distal pulses no edema Neurologic:  Normal speech and language. No gross focal neurologic deficits are appreciated.  Skin:  Skin is warm, dry and intact. No rash noted. Psychiatric: Mood and affect are normal. Speech and behavior are normal.  ____________________________________________   LABS (all labs ordered are listed, but only abnormal results are displayed)  Labs Reviewed  COMPREHENSIVE METABOLIC PANEL - Abnormal; Notable for the following:       Result Value   Potassium 3.3 (*)    Glucose, Bld 154 (*)    ALT 11 (*)    GFR calc non Af Amer 56 (*)    All other components within normal limits  CBC - Abnormal; Notable for the following:    RDW 15.3 (*)    All other components within normal limits  LIPASE, BLOOD  TROPONIN I  URINALYSIS, COMPLETE (UACMP) WITH MICROSCOPIC   ____________________________________________  EKG  I personally interpreted any EKGs ordered by me or triage Normal sinus rhythm rate 85 bpm, no acute ST elevation or  depression, LAD noted, no acute ischemic changes ____________________________________________  RADIOLOGY  I reviewed any imaging ordered by me or triage that were performed during my shift and, if possible, patient and/or family made aware of any abnormal findings. ____________________________________________   PROCEDURES  Procedure(s) performed: None  Procedures  Critical Care performed: None  ____________________________________________   INITIAL IMPRESSION / ASSESSMENT AND PLAN / ED COURSE  Pertinent labs & imaging results that were available during my care of the patient were reviewed by me and considered in my medical decision making (see chart for details).    ----------------------------------------- 2:57 PM on 12/05/2016 -----------------------------------------  Paging dr. Jeffie Pollock, even though the bladder scan only shows 320 my concern given lower abdominal pain this patient is from retention.  ----------------------------------------- 3:37 PM on 12/05/2016 -----------------------------------------  D/w dr. Jeffie Pollock, who looked at the ct and asks that we encourage urination and get ct read and call him back.  We are doing this.   ----------------------------------------- 3:41 PM on 12/05/2016 -----------------------------------------  Patient has diverticulitis with likely microperforation, giving Zosyn, have discussed with surgery who will come evaluate patient.    ____________________________________________   FINAL CLINICAL IMPRESSION(S) / ED DIAGNOSES  Final diagnoses:  None      This chart was dictated using voice recognition software.  Despite best efforts to proofread,  errors can occur which can change meaning.      Schuyler Amor, MD 12/05/16 [phone]

## 2016-12-05 NOTE — Telephone Encounter (Signed)
Called patient to confirm he did call EMS. Pt stated that paramedics had just arrived.

## 2016-12-05 NOTE — ED Notes (Signed)
Patient given warm blanket.

## 2016-12-05 NOTE — Telephone Encounter (Signed)
[facility]  Patient Name: Cameron Foster  DOB: [birth year]    Initial Comment Caller states c/o severe abdominal pain.    Nurse Assessment  Nurse: Harlow Mares, RN, Suanne Marker Date/Time (Eastern Time): 12/05/2016 10:25:00 AM  Confirm and document reason for call. If symptomatic, describe symptoms. ---Caller states c/o severe abdominal pain. Caller is trying to vomit at the time of the call.  Does the patient have any new or worsening symptoms? ---Yes  Will a triage be completed? ---Yes  Related visit to physician within the last 2 weeks? ---N/A  Does the PT have any chronic conditions? (i.e. diabetes, asthma, etc.) ---Yes  List chronic conditions. ---diverticulitis;  Is this a behavioral health or substance abuse call? ---No     Guidelines    Guideline Title Affirmed Question Affirmed Notes  Abdominal Pain - Male Shock suspected (e.g., cold/pale/clammy skin, too weak to stand, low BP, rapid pulse)    Final Disposition User   Call EMS 911 Now Harlow Mares, RN, Suanne Marker    Disagree/Comply: Leta Baptist

## 2016-12-05 NOTE — Telephone Encounter (Signed)
fyi

## 2016-12-05 NOTE — Telephone Encounter (Signed)
Noted  

## 2016-12-05 NOTE — ED Notes (Signed)
Patient transported to CT 

## 2016-12-05 NOTE — ED Triage Notes (Signed)
States abd pain throughout since yesterday.

## 2016-12-05 NOTE — Telephone Encounter (Signed)
Pt is having severe stomach pain , that started this morning acutely    No other symptoms to report  Pt transferred to Nurse line

## 2016-12-05 NOTE — Plan of Care (Signed)
Problem: Nutrition: Goal: Adequate nutrition will be maintained Outcome: Not Progressing Pt is NPO

## 2016-12-06 DIAGNOSIS — R338 Other retention of urine: Secondary | ICD-10-CM

## 2016-12-06 DIAGNOSIS — N358 Other urethral stricture: Secondary | ICD-10-CM

## 2016-12-06 LAB — URINALYSIS, COMPLETE (UACMP) WITH MICROSCOPIC
Bilirubin Urine: NEGATIVE
GLUCOSE, UA: 50 mg/dL — AB
Hgb urine dipstick: NEGATIVE
Ketones, ur: NEGATIVE mg/dL
Leukocytes, UA: NEGATIVE
NITRITE: NEGATIVE
PH: 5 (ref 5.0–8.0)
Protein, ur: NEGATIVE mg/dL
SPECIFIC GRAVITY, URINE: 1.018 (ref 1.005–1.030)

## 2016-12-06 LAB — COMPREHENSIVE METABOLIC PANEL
ALK PHOS: 42 U/L (ref 38–126)
ALT: 12 U/L — ABNORMAL LOW (ref 17–63)
ANION GAP: 7 (ref 5–15)
AST: 34 U/L (ref 15–41)
Albumin: 2.9 g/dL — ABNORMAL LOW (ref 3.5–5.0)
BILIRUBIN TOTAL: 1.1 mg/dL (ref 0.3–1.2)
BUN: 29 mg/dL — ABNORMAL HIGH (ref 6–20)
CALCIUM: 8.2 mg/dL — AB (ref 8.9–10.3)
CO2: 25 mmol/L (ref 22–32)
Chloride: 106 mmol/L (ref 101–111)
Creatinine, Ser: 2.6 mg/dL — ABNORMAL HIGH (ref 0.61–1.24)
GFR, EST AFRICAN AMERICAN: 26 mL/min — AB (ref >60)
GFR, EST NON AFRICAN AMERICAN: 22 mL/min — AB (ref >60)
Glucose, Bld: 110 mg/dL — ABNORMAL HIGH (ref 65–99)
Potassium: 4.6 mmol/L (ref 3.5–5.1)
Sodium: 138 mmol/L (ref 135–145)
TOTAL PROTEIN: 6.3 g/dL — AB (ref 6.5–8.1)

## 2016-12-06 LAB — CBC
HEMATOCRIT: 34.4 % — AB (ref 40.0–52.0)
HEMOGLOBIN: 11.5 g/dL — AB (ref 13.0–18.0)
MCH: 28 pg (ref 26.0–34.0)
MCHC: 33.4 g/dL (ref 32.0–36.0)
MCV: 83.6 fL (ref 80.0–100.0)
Platelets: 219 10*3/uL (ref 150–440)
RBC: 4.11 MIL/uL — ABNORMAL LOW (ref 4.40–5.90)
RDW: 15.7 % — ABNORMAL HIGH (ref 11.5–14.5)
WBC: 11.5 10*3/uL — ABNORMAL HIGH (ref 3.8–10.6)

## 2016-12-06 MED ORDER — ENOXAPARIN SODIUM 30 MG/0.3ML ~~LOC~~ SOLN
30.0000 mg | SUBCUTANEOUS | Status: DC
  Administered 2016-12-06 – 2016-12-08 (×3): 30 mg via SUBCUTANEOUS
  Filled 2016-12-06 (×3): qty 0.3

## 2016-12-06 MED ORDER — ACETAMINOPHEN 10 MG/ML IV SOLN
1000.0000 mg | Freq: Four times a day (QID) | INTRAVENOUS | Status: AC
  Administered 2016-12-06 – 2016-12-07 (×3): 1000 mg via INTRAVENOUS
  Filled 2016-12-06 (×4): qty 100

## 2016-12-06 MED ORDER — HYDROMORPHONE HCL 1 MG/ML IJ SOLN: 1.0000 mg | INTRAMUSCULAR | Status: DC | PRN

## 2016-12-06 MED ORDER — HYDROMORPHONE HCL 1 MG/ML IJ SOLN
1.0000 mg | INTRAMUSCULAR | Status: DC | PRN
Start: 2016-12-06 — End: 2016-12-06
  Administered 2016-12-06 (×2): 0.5 mg via INTRAVENOUS
  Filled 2016-12-06 (×3): qty 1

## 2016-12-06 MED ORDER — SODIUM CHLORIDE 0.9 % IV BOLUS (SEPSIS)
1000.0000 mL | Freq: Once | INTRAVENOUS | Status: AC
  Administered 2016-12-06: 1000 mL via INTRAVENOUS

## 2016-12-06 MED ORDER — HYDROMORPHONE HCL 1 MG/ML IJ SOLN
0.5000 mg | INTRAMUSCULAR | Status: DC | PRN
Start: 2016-12-06 — End: 2016-12-06
  Administered 2016-12-06: 0.5 mg via INTRAVENOUS

## 2016-12-06 MED ORDER — HYDROMORPHONE HCL 1 MG/ML IJ SOLN: 0.5000 mg | INTRAMUSCULAR | Status: DC | PRN

## 2016-12-06 MED ORDER — MORPHINE SULFATE (PF) 4 MG/ML IV SOLN
4.0000 mg | INTRAVENOUS | Status: DC | PRN
  Administered 2016-12-06 – 2016-12-13 (×5): 4 mg via INTRAVENOUS
  Filled 2016-12-06 (×6): qty 1

## 2016-12-06 NOTE — Progress Notes (Signed)
CC: Diverticulitis Subjective: 76 year old male with diverticulitis. Patient reports that he actually feels somewhat better than yesterday but continues to feel ill. Continues to have abdominal pain. Denies nausea, vomiting. States he has had difficulty urinating since admission.  Objective: Vital signs in last 24 hours: Temp:  [97.8 F (36.6 C)-99.2 F (37.3 C)] 98.1 F (36.7 C) (08/21 0844) Pulse Rate:  [79-89] 79 (08/21 0844) Resp:  [14-20] 14 (08/21 0844) BP: (92-167)/(55-94) 92/55 (08/21 0844) SpO2:  [90 %-97 %] 95 % (08/21 0844) Weight:  [90.7 kg (200 lb)] 90.7 kg (200 lb) (08/20 1132) Last BM Date: 12/05/16  Intake/Output from previous day: 08/20 0701 - 08/21 0700 In: 1488 [I.V.:1443; IV Piggyback:45] Out: 225 [Urine:225] Intake/Output this shift: Total I/O In: 253 [I.V.:253] Out: -   Physical exam:  Gen.: No acute distress Chest: Clear to auscultation  heart: Regular rhythm Abdomen: Soft, tender to palpation in bilateral lower quadrant. No rebound but some guarding on exam today.  Lab Results: CBC   Recent Labs  12/05/16 1138 12/06/16 0413  WBC 5.3 11.5*  HGB 13.6 11.5*  HCT 41.8 34.4*  PLT 276 219   BMET  Recent Labs  12/05/16 1138 12/06/16 0413  NA 140 138  K 3.3* 4.6  CL 106 106  CO2 25 25  GLUCOSE 154* 110*  BUN 16 29*  CREATININE 1.22 2.60*  CALCIUM 9.1 8.2*   PT/INR No results for input(s): LABPROT, INR in the last 72 hours. ABG No results for input(s): PHART, HCO3 in the last 72 hours.  Invalid input(s): PCO2, PO2  Studies/Results: Ct Renal Stone Study  Result Date: 12/05/2016 CLINICAL DATA:  Diffuse abdominal pain. EXAM: CT ABDOMEN AND PELVIS WITHOUT CONTRAST TECHNIQUE: Multidetector CT imaging of the abdomen and pelvis was performed following the standard protocol without IV contrast. COMPARISON:  CT abdomen and pelvis dated November 01, 2016. FINDINGS: Lower chest: No acute abnormality. Coronary artery atherosclerotic vascular  calcifications. Hepatobiliary: No focal liver abnormality is seen. No gallstones, gallbladder wall thickening, or biliary dilatation. Pancreas: Unremarkable. No pancreatic ductal dilatation or surrounding inflammatory changes. Spleen: Normal in size without focal abnormality. Adrenals/Urinary Tract: Adrenal glands are unremarkable. Multiple bilateral renal cysts are unchanged. No renal calculi, focal lesion, or hydronephrosis. Bladder is unremarkable. Stomach/Bowel: Extensive colonic diverticulosis with long segment sigmoid colon wall thickening and pericolonic fat stranding and fluid, consistent with diverticulitis. There are a few small foci of free air. No drainable fluid collection. The appendix is normal. The stomach and small bowel are within normal limits. Vascular/Lymphatic: Aortic atherosclerosis. No enlarged abdominal or pelvic lymph nodes. Reproductive: Unchanged brachytherapy seeds in the prostate gland. Other: Small amount of free fluid in the pelvis and adjacent to the liver. Musculoskeletal: No acute or significant osseous findings. Degenerative changes of the lumbar spine. IMPRESSION: 1. Findings consistent with sigmoid diverticulitis with probable microperforation. No drainable fluid collection. 2.  Aortic atherosclerosis (ICD10-I70.0). Electronically Signed   By: Titus Dubin M.D.   On: 12/05/2016 15:31    Anti-infectives: Anti-infectives    Start     Dose/Rate Route Frequency Ordered Stop   12/05/16 1600  piperacillin-tazobactam (ZOSYN) IVPB 3.375 g     3.375 g 100 mL/hr over 30 Minutes Intravenous  Once 12/05/16 1538 12/05/16 1559   12/05/16 0000  piperacillin-tazobactam (ZOSYN) IVPB 3.375 g     3.375 g 12.5 mL/hr over 240 Minutes Intravenous Every 8 hours 12/05/16 1721        Assessment/Plan:  76 year old male admitted with sigmoid diverticulitis. Labs have  worsened today and patient now has a guarding or he did not yesterday. Also having difficulty urinating. He is noted to  be postoperative from prostate cancer and had a difficult Foley placement earlier this year. Have contacted urology for assistance with Foley catheter placement for better following of ins and outs. Plan to continue IV antibiotics and serial exams. Should he fail to improve over the next 24-48 hours he will likely require an operative intervention. Should he worsen then an operation may be needed urgently. Encourage ambulation and incentive spirometer usage. Plan to reassess fluid status after placement of Foley catheter by urology.  Sister Carbone T. Adonis Huguenin, MD, [facility] General Surgeon [facility]  Day [facility] [phone] Night [facility] [phone] 12/06/2016

## 2016-12-06 NOTE — Plan of Care (Signed)
Problem: Nutrition: Goal: Adequate nutrition will be maintained Outcome: Not Progressing Pt is NPO

## 2016-12-06 NOTE — Progress Notes (Signed)
Give bolus of normal saline per MD order. Upon reassessment pt presented crackles in the base of his lungs. Notified Dr. Adonis Huguenin. He recommended to keep monitoring and reasessing the pt.

## 2016-12-06 NOTE — Progress Notes (Signed)
PHARMACIST - PHYSICIAN COMMUNICATION  CONCERNING:  Enoxaparin (Lovenox) for DVT Prophylaxis    RECOMMENDATION: Patient was prescribed enoxaprin 57m q24 hours for VTE prophylaxis.   Filed Weights   12/05/16 1132  Weight: 200 lb (90.7 kg)    Body mass index is 29.53 kg/m.  Estimated Creatinine Clearance: 26.9 mL/min (A) (by C-G formula based on SCr of 2.6 mg/dL (H)).  Patient is candidate for enoxaparin 36mevery 24 hours based on CrCl <309min   DESCRIPTION: Pharmacy has adjusted enoxaparin dose.  Patient is now receiving enoxaparin 65m4mery 24 hours.  Pharmacy will monitor renal function and increase dose of enoxaparin if renal function improves .   SheeNancy FetterarmD Clinical Pharmacist  12/06/2016 11:59 AM

## 2016-12-06 NOTE — Care Management (Addendum)
This patient was a medicare bundle patient in April 2018. Bundle team notified. Patient is no longer followed under Bundle.

## 2016-12-06 NOTE — Progress Notes (Signed)
Seen and examined. Spike fever He feels a bit better. Not toxic and not septic  VSS PE NAD Abd: soft. Mild TTP no peritonitis  A/P Diverticulitis now w persistent fevers, will likely require a Hartman's but not peritonitis tonight. He does have hx  Of radiation to the pelvis and will likely be a challenging case. If he deteriorates we may have to do an  emergent operation.

## 2016-12-06 NOTE — Consult Note (Signed)
@ENCDATE @ 11:31 AM   [place]. January 17, 1941 [id number]  Consult: Low urine output, history of urethral stricture Referring provider: Dr. Tenny Craw  Chief Complaint  Patient presents with  . Abdominal Pain    HPI: 76 yo male admitted with acute diverticulitis/abd pain and we were consulted for difficult foley. The patient has a h/o Gleason 7 prostate cancer, treated in 2012 with radiation therapy. PSA undetectable May 2018. Prior to that, he had transurethral microwave thermotherapy. Pt required cysto, foley over wire and clot evac April 2017 with Dr. Jeffie Pollock and cysto/foley over wire with Dr. Erlene Quan Apr 2018. On the cystoscopic note there was no stricture but some fusion/scarring/irregular prostatic urethral contour and tight bladder neck.    Currently, pt on IVF and IV abx, but UOP low. Per Dr. Adonis Huguenin the patient had 25 mL UOP last shift and only 225 mL in 24 hours listed in EPIC. Dr. Adonis Huguenin needs accurate urine output to monitor for appropriate resuscitation. Also, creatinine is up to 2.6 today from baseline around 1.2-1.4. Bladder scans are not felt to be reliable given the patient's large size, but have read 95 mL. Exam difficult because of abd pain. He did undergo a CT scan of the abdomen and pelvis without contrast yesterday which revealed diverticulitis. As far GU tract the bladder was somewhat distended. There was no hydronephrosis. Pt hasn't voided, but doesn't feel the need to void. Of note, pt said he went to the [employer] [place] tournament five days ago and had to be carted off the course due to exhaustion after walking some of the hills. Pt has been voiding with a good stream and clear urine prior to this admission.    Intake/Output Summary (Last 24 hours) at 12/06/16 1132 Last data filed at 12/06/16 5374  Gross per 24 hour  Intake             1741 ml  Output              225 ml  Net             1516 ml     PMH: Past Medical History:  Diagnosis Date  .  Alcoholism ([place])   . Arrhythmia   . Arthritis   . Asthma   . Atrial fibrillation ([place])    one episode  . Colon polyp   . Depression   . Diverticulitis   . Diverticulosis 30 years  . Fainting   . GERD (gastroesophageal reflux disease)   . History of blood clots   . Hyperlipidemia   . Hypertension   . Irritable bowel syndrome   . Prostate cancer ([place]) 2012   treated with radiation therapy.  . Vasovagal syncope   . Vertigo     Surgical History: Past Surgical History:  Procedure Laterality Date  . CARDIAC CATHETERIZATION     [place] no stents  . CATARACT EXTRACTION, BILATERAL    . COLONOSCOPY  2016  . COLONOSCOPY WITH PROPOFOL N/A 05/30/2016   Procedure: COLONOSCOPY WITH PROPOFOL;  Surgeon: Lollie Sails, MD;  Location: [facility] ENDOSCOPY;  Service: Endoscopy;  Laterality: N/A;  . ESOPHAGOGASTRODUODENOSCOPY    . ESOPHAGOGASTRODUODENOSCOPY N/A 05/30/2016   Procedure: ESOPHAGOGASTRODUODENOSCOPY (EGD);  Surgeon: Lollie Sails, MD;  Location: [facility] ENDOSCOPY;  Service: Endoscopy;  Laterality: N/A;  . FRACTURE SURGERY Bilateral    right arm and left wrist  . KNEE ARTHROSCOPY    . PROSTATE SURGERY     Microwave therapy  . TONSILLECTOMY    .  TOTAL KNEE ARTHROPLASTY Right 07/27/2016   Procedure: TOTAL KNEE ARTHROPLASTY;  Surgeon: [name], MD;  Location: [facility] ORS;  Service: Orthopedics;  Laterality: Right;  Dr. Erlene Quan had to place Urinary catheter due to prostate cancer history.  Using flexible scope.    Home Medications:  In hospital meds reviewed. Pt on zosyn   Allergies:  Allergies  Allergen Reactions  . Formaldehyde Rash and Other (See Comments)  . Tape Rash    Family History: Family History  Problem Relation Age of Onset  . Lung cancer Father   . Bipolar disorder Son   . Sudden death Son        due to drug over dose and cardiac issues  . Prostate cancer Neg Hx   . Bladder Cancer Neg Hx     Social History:  reports that he quit smoking about 28  years ago. He quit after 27.00 years of use. His smokeless tobacco use includes Chew. He reports that he drinks about 0.6 oz of alcohol per week . He reports that he does not use drugs.  ROS:  A Multi-point review of systems was asked and was negative except for the findings documented in the present illness                                        Physical Exam: BP (!) 92/55 (BP Location: Right Arm)   Pulse 79   Temp 98.1 F (36.7 C) (Oral)   Resp 14   Ht 5' 9"  (1.753 m)   Wt 90.7 kg (200 lb)   SpO2 95%   BMI 29.53 kg/m   Constitutional:  Alert and oriented, No acute distress. HEENT: [place] AT, moist mucus membranes.  Trachea midline, no masses. Cardiovascular: No clubbing, cyanosis, or edema. Respiratory: Normal respiratory effort, no increased work of breathing. GI: Abdomen is soft, nontender, nondistended, no abdominal masses GU: No CVA tenderness. Penis, metus and scrotum appear normal.  Skin: No rashes, bruises or suspicious lesions. Lymph: No cervical or inguinal adenopathy. Neurologic: Grossly intact, no focal deficits, moving all 4 extremities. Psychiatric: Normal mood and affect.   Procedure: I discussed with patient nature r/b of blind catheter passage and cystoscopy if he needed it. He elected to proceed. He was prepped and draped. A 16Fr coude wouldn't pass and I saw pink urine on the tip, so didn't want to force it. The flexible cystoscope was passed and steered into the bladder. A sensor wire was passed and scoped back out. A 16 fr council was passed with some resistance at [facility].   Laboratory Data: Lab Results  Component Value Date   WBC 11.5 (H) 12/06/2016   HGB 11.5 (L) 12/06/2016   HCT 34.4 (L) 12/06/2016   MCV 83.6 12/06/2016   PLT 219 12/06/2016    Lab Results  Component Value Date   CREATININE 2.60 (H) 12/06/2016    Lab Results  Component Value Date   PSA 0.05 (L) 07/30/2015   PSA 0.09 (L) 01/20/2015    No results found for:  TESTOSTERONE  Lab Results  Component Value Date   HGBA1C 5.6 05/16/2016    Urinalysis    Component Value Date/Time   COLORURINE YELLOW (A) 12/06/2016 0045   APPEARANCEUR HAZY (A) 12/06/2016 0045   APPEARANCEUR Clear 08/13/2015 1631   LABSPEC 1.018 12/06/2016 0045   PHURINE 5.0 12/06/2016 0045   GLUCOSEU 50 (A) 12/06/2016 0045  HGBUR NEGATIVE 12/06/2016 0045   BILIRUBINUR NEGATIVE 12/06/2016 0045   BILIRUBINUR negative 05/23/2016 1051   BILIRUBINUR Negative 08/13/2015 1631   KETONESUR NEGATIVE 12/06/2016 0045   PROTEINUR NEGATIVE 12/06/2016 0045   UROBILINOGEN 0.2 05/23/2016 1051   NITRITE NEGATIVE 12/06/2016 0045   LEUKOCYTESUR NEGATIVE 12/06/2016 0045   LEUKOCYTESUR Trace (A) 08/13/2015 1631    Pertinent Imaging: CT images   Assessment & Plan:   1) prostatic urethral irregularity/bladder neck contracture - s/p foley catheter placement over wire. Foley per primary team. D/C when UOP monitoring no longer needed and pt is ambulatory (so that he can stand to void).   2) AKI - likely dehydration. Only 100 mL in bladder. Looks concentrated. No hydro on CT. Discussed with Dr. Adonis Huguenin.   I'll sign off. Please page GU with questions.   @DIAGMED @  No Follow-up on file.  Festus Aloe, [facility] [address] [place], [place] [address] [phone]

## 2016-12-07 ENCOUNTER — Encounter
Admission: EM | Disposition: A | Discharge status: Home Health | Payer: Self-pay | Source: Home / Self Care | Attending: General Surgery

## 2016-12-07 ENCOUNTER — Inpatient Hospital Stay: Payer: Medicare Other | Admitting: Registered Nurse

## 2016-12-07 ENCOUNTER — Encounter: Payer: Self-pay | Admitting: *Deleted

## 2016-12-07 ENCOUNTER — Inpatient Hospital Stay: Payer: Medicare Other

## 2016-12-07 HISTORY — PX: LAPAROTOMY: SHX154

## 2016-12-07 HISTORY — PX: COLON RESECTION SIGMOID: SHX6737

## 2016-12-07 HISTORY — PX: COLOSTOMY: SHX63

## 2016-12-07 HISTORY — PX: INCISION AND DRAINAGE ABSCESS: SHX5864

## 2016-12-07 LAB — BASIC METABOLIC PANEL
Anion gap: 6 (ref 5–15)
BUN: 40 mg/dL — AB (ref 6–20)
CHLORIDE: 109 mmol/L (ref 101–111)
CO2: 25 mmol/L (ref 22–32)
CREATININE: 3.42 mg/dL — AB (ref 0.61–1.24)
Calcium: 8.5 mg/dL — ABNORMAL LOW (ref 8.9–10.3)
GFR calc Af Amer: 19 mL/min — ABNORMAL LOW (ref >60)
GFR calc non Af Amer: 16 mL/min — ABNORMAL LOW (ref >60)
GLUCOSE: 115 mg/dL — AB (ref 65–99)
Potassium: 4.2 mmol/L (ref 3.5–5.1)
Sodium: 140 mmol/L (ref 135–145)

## 2016-12-07 LAB — CBC
HEMATOCRIT: 34.3 % — AB (ref 40.0–52.0)
HEMOGLOBIN: 11.2 g/dL — AB (ref 13.0–18.0)
MCH: 27 pg (ref 26.0–34.0)
MCHC: 32.6 g/dL (ref 32.0–36.0)
MCV: 82.8 fL (ref 80.0–100.0)
Platelets: 204 10*3/uL (ref 150–440)
RBC: 4.14 MIL/uL — ABNORMAL LOW (ref 4.40–5.90)
RDW: 16 % — AB (ref 11.5–14.5)
WBC: 13.2 10*3/uL — ABNORMAL HIGH (ref 3.8–10.6)

## 2016-12-07 LAB — MRSA PCR SCREENING: MRSA by PCR: POSITIVE — AB

## 2016-12-07 IMAGING — CT CT ABD-PELV W/O CM
2 of 4 series · 16 of 46 positions shown, 18 images · non-contrast
Comparison: [DATE]

CLINICAL DATA: Followup diverticulitis.

EXAM:
CT ABDOMEN AND PELVIS WITHOUT CONTRAST
TECHNIQUE: Multidetector CT imaging of the abdomen and pelvis was performed
following the standard protocol without IV contrast.

[Series 2: routine abd/pel wo · axial · 0.95mm/px · z∈[-1132,-652]mm · 13 of 104 slices shown, 15 images]
[im 4/104  soft-tissue]
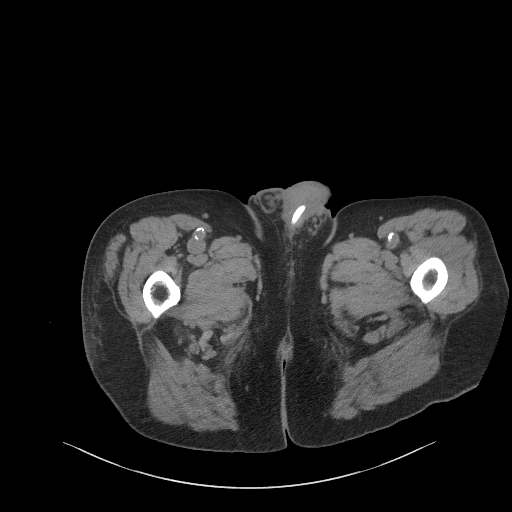
[im 4/104  bone]
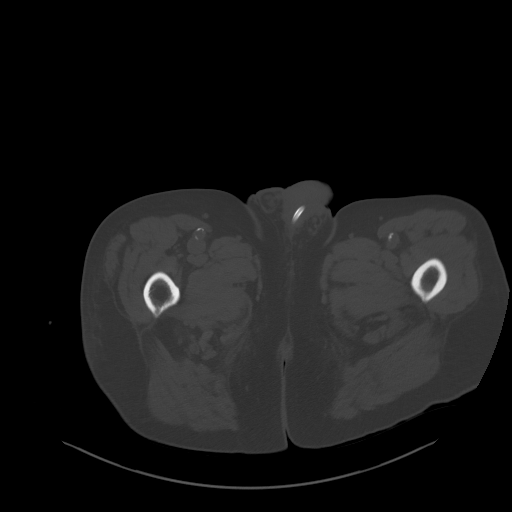
[im 12/104  soft-tissue]
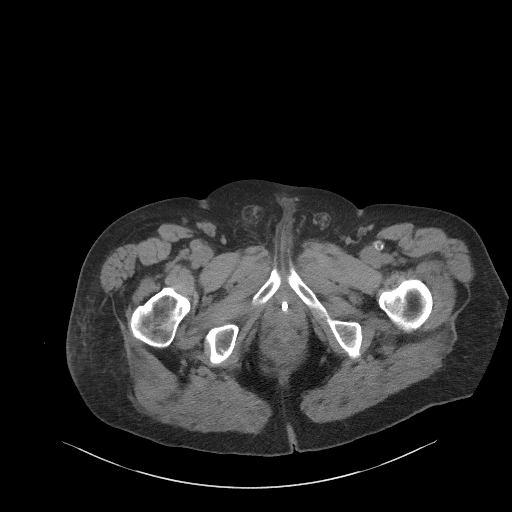
[im 20/104  soft-tissue]
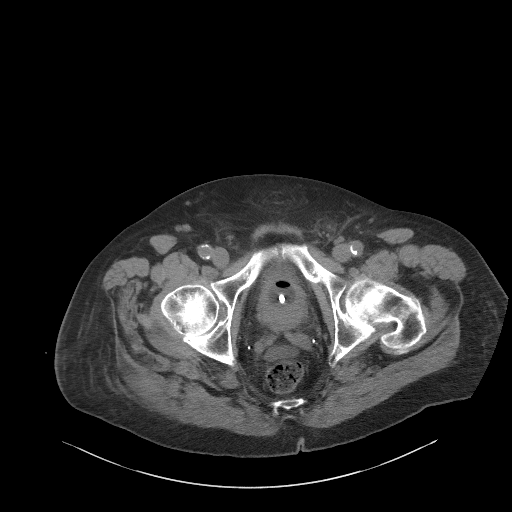
[im 28/104  soft-tissue]
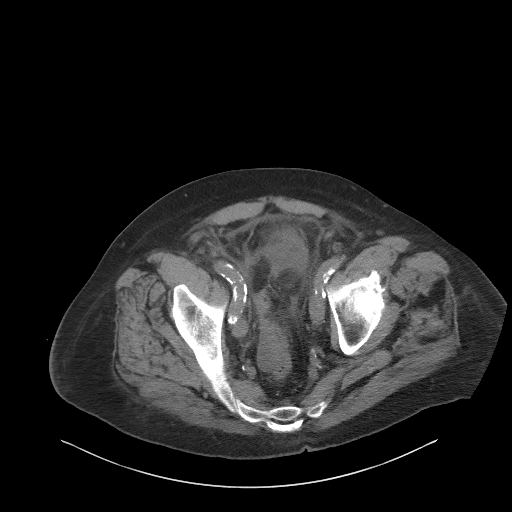
[im 36/104  soft-tissue]
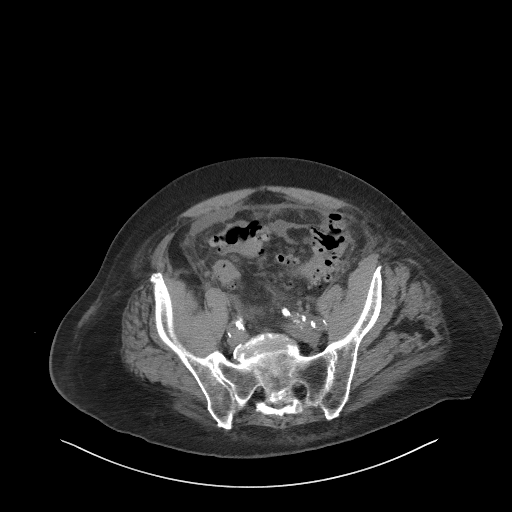
[im 44/104  soft-tissue]
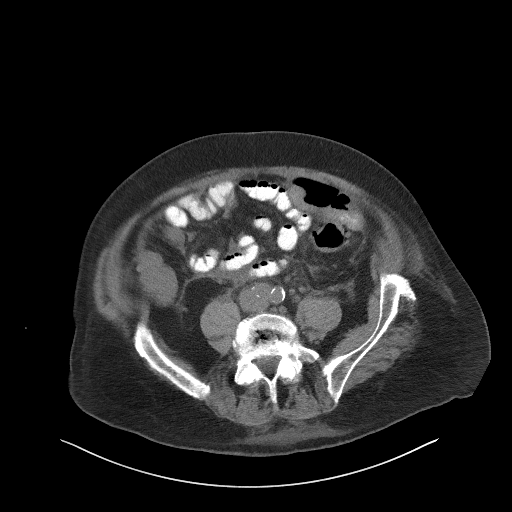
[im 52/104  soft-tissue]
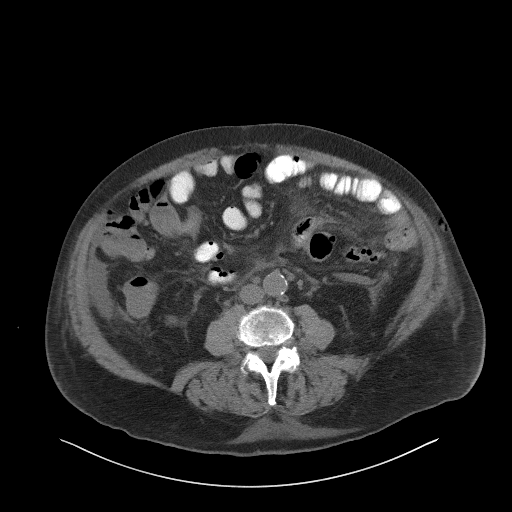
[im 60/104  soft-tissue]
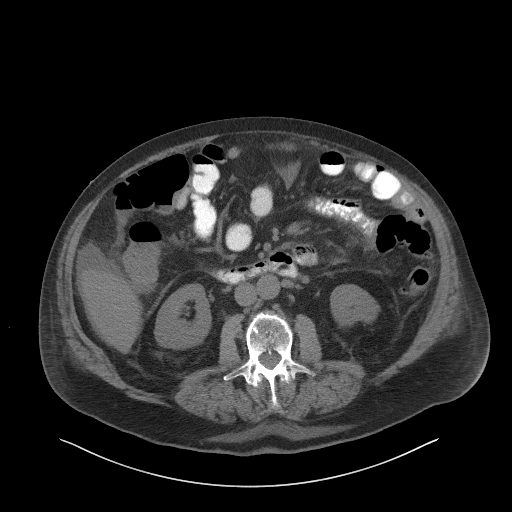
[im 68/104  soft-tissue]
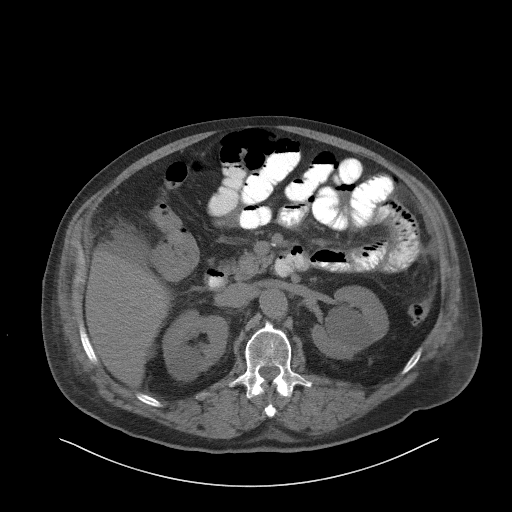
[im 68/104  bone]
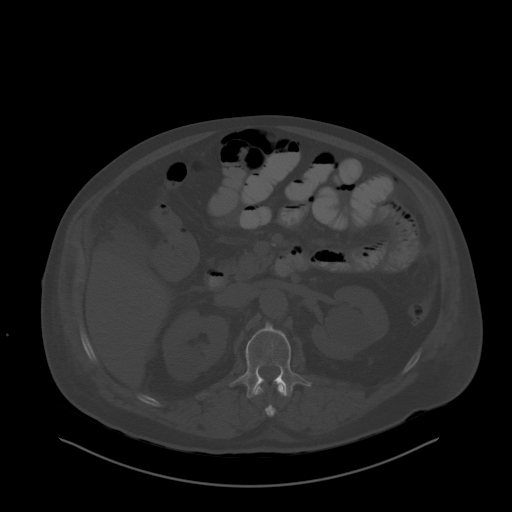
[im 76/104  soft-tissue]
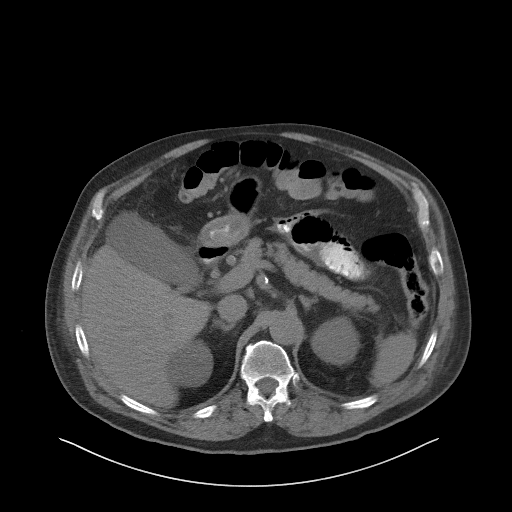
[im 84/104  soft-tissue]
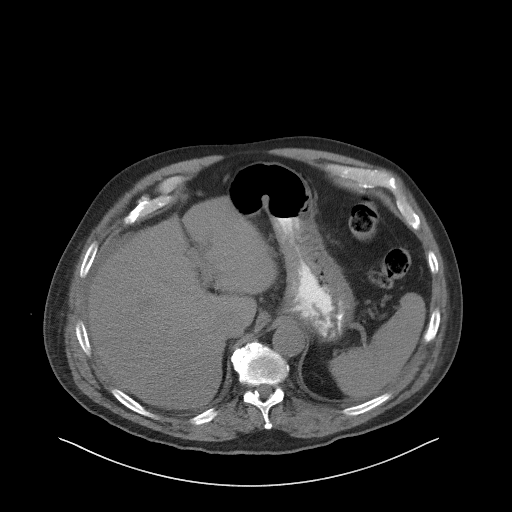
[im 92/104  soft-tissue]
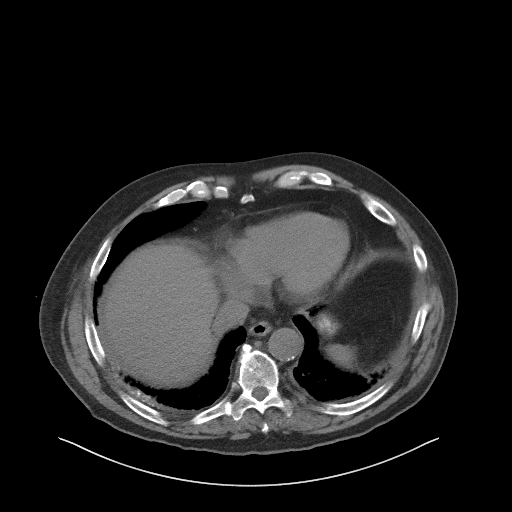
[im 100/104  soft-tissue]
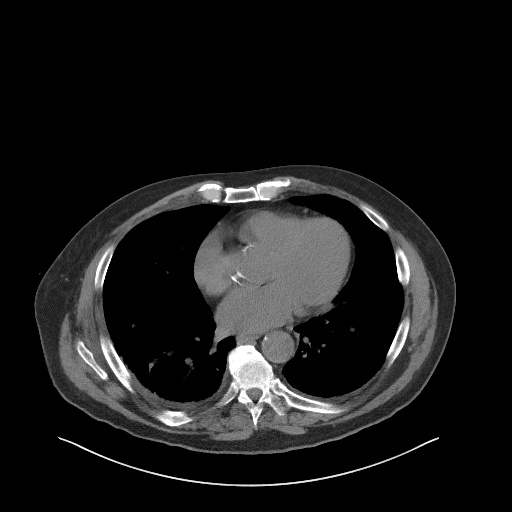

[Series 5: coronal st · coronal · 0.92mm/px · 3 of 122 slices shown]
[im 41/122  soft-tissue]
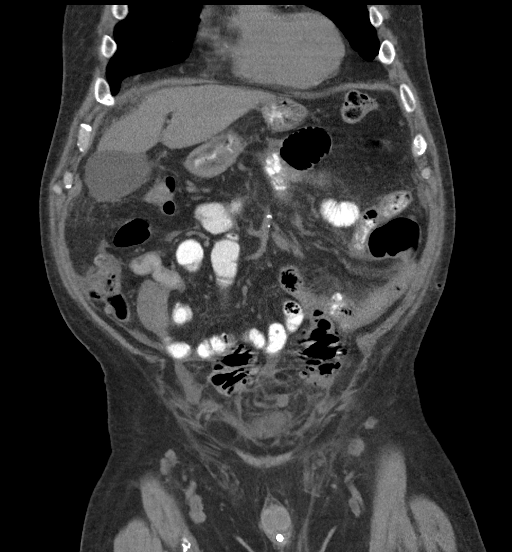
[im 54/122  soft-tissue]
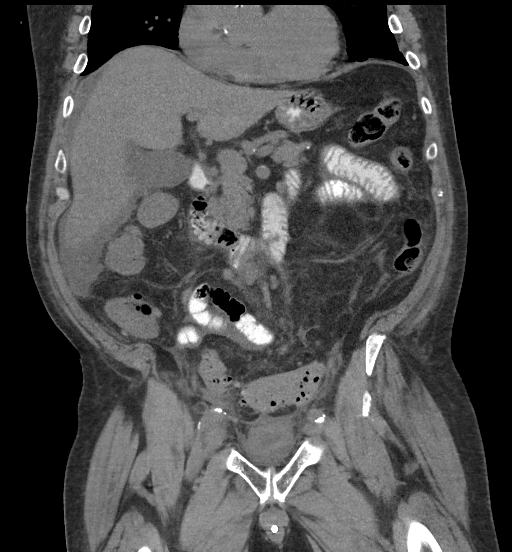
[im 68/122  soft-tissue]
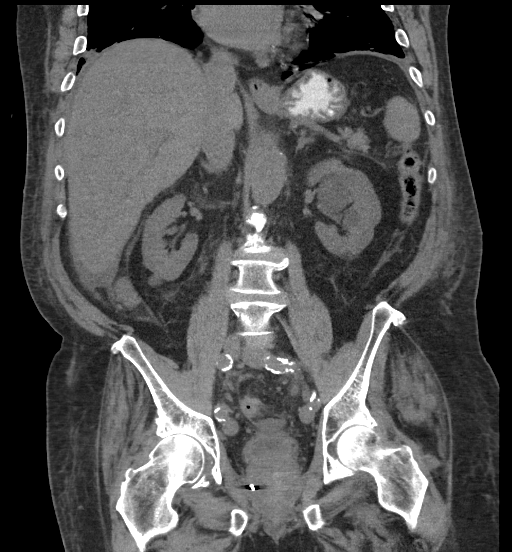

[16 of 46 positions shown; findings below may reference images not displayed]

FINDINGS: Lower chest: Trace bilateral pleural effusions.

Hepatobiliary: No focal liver abnormality. The gallbladder appears
normal. No biliary dilatation.

Pancreas: Unremarkable. No pancreatic ductal dilatation or
surrounding inflammatory changes.

Spleen: Normal in size without focal abnormality.

Adrenals/Urinary Tract: The adrenal glands are normal. Bilateral
kidney cysts are again noted. No hydronephrosis. The urinary bladder
is collapsed around a Foley catheter balloon. Mottled gas is
identified within the dome of bladder. Nonspecific.

Stomach/Bowel: Normal appearance of the stomach. No pathologic
dilatation of the small bowel loops. The proximal colon appears
normal. Abnormal wall thickening and inflammation associated with
the sigmoid colon is again noted compatible with acute
diverticulitis.

Vascular/Lymphatic: There is aortic atherosclerosis. No aneurysm. No
adenopathy scratch set multiple prominent lymph nodes identified
within the upper abdomen. No adenopathy. No pelvic or inguinal
adenopathy.

Reproductive: Seed implants noted within the prostate gland.

Other: Increase in abdominal and pelvic ascites. No discrete
drainable fluid collection identified at this time. There is
evidence of pneumoperitoneum which is a new finding when compared
with the previous exam indicating presence of free perforation.

Musculoskeletal: Degenerative disc disease noted within the lower
thoracic and lumbar spine.
IMPRESSION: 1. New pneumoperitoneum is identified within the abdomen. Findings
are compatible with bowel perforation secondary to diverticulitis.
Not apparent on study from [DATE].
2. Increase in free fluid within the abdomen and pelvis. No discrete
drainable fluid collection identified at this time.
3. Persistent inflammatory changes and wall thickening associated
with the sigmoid colon.
4. Urinary bladder is partially collapsed around a Foley catheter.
Bowel gas within the dome of bladder is nonspecific. The appearance
could be seen however with colovesical fistula. Attention this on
follow-up imaging is advised.
5.  Aortic Atherosclerosis ([CS]-[CS]).

## 2016-12-07 SURGERY — LAPAROTOMY, EXPLORATORY
Anesthesia: General | Site: Abdomen | Wound class: Dirty or Infected

## 2016-12-07 MED ORDER — ACETAMINOPHEN 10 MG/ML IV SOLN
INTRAVENOUS | Status: AC
Start: 2016-12-07 — End: ?
  Filled 2016-12-07: qty 100

## 2016-12-07 MED ORDER — SODIUM CHLORIDE 0.9 % IV SOLN
INTRAVENOUS | Status: DC | PRN
  Administered 2016-12-07: 30 ug/min via INTRAVENOUS

## 2016-12-07 MED ORDER — FENTANYL CITRATE (PF) 100 MCG/2ML IJ SOLN
INTRAMUSCULAR | Status: AC
  Filled 2016-12-07: qty 2

## 2016-12-07 MED ORDER — IOPAMIDOL (ISOVUE-300) INJECTION 61%
15.0000 mL | INTRAVENOUS | Status: AC
  Administered 2016-12-07 (×2): 15 mL via ORAL

## 2016-12-07 MED ORDER — PROPOFOL 10 MG/ML IV BOLUS
INTRAVENOUS | Status: AC
Start: 2016-12-07 — End: ?
  Filled 2016-12-07: qty 20

## 2016-12-07 MED ORDER — FENTANYL CITRATE (PF) 100 MCG/2ML IJ SOLN
25.0000 ug | INTRAMUSCULAR | Status: DC | PRN
  Administered 2016-12-07 (×4): 25 ug via INTRAVENOUS

## 2016-12-07 MED ORDER — ONDANSETRON HCL 4 MG/2ML IJ SOLN
INTRAMUSCULAR | Status: DC | PRN
  Administered 2016-12-07: 4 mg via INTRAVENOUS

## 2016-12-07 MED ORDER — ROCURONIUM BROMIDE 100 MG/10ML IV SOLN
INTRAVENOUS | Status: DC | PRN
  Administered 2016-12-07: 50 mg via INTRAVENOUS

## 2016-12-07 MED ORDER — FENTANYL CITRATE (PF) 100 MCG/2ML IJ SOLN
INTRAMUSCULAR | Status: AC
  Administered 2016-12-07: 25 ug via INTRAVENOUS
  Filled 2016-12-07: qty 2

## 2016-12-07 MED ORDER — DIPHENHYDRAMINE HCL 12.5 MG/5ML PO ELIX
12.5000 mg | ORAL_SOLUTION | Freq: Four times a day (QID) | ORAL | Status: DC | PRN
  Filled 2016-12-07: qty 5

## 2016-12-07 MED ORDER — CHLORHEXIDINE GLUCONATE CLOTH 2 % EX PADS
6.0000 | MEDICATED_PAD | Freq: Every day | CUTANEOUS | Status: AC
  Administered 2016-12-08 – 2016-12-12 (×5): 6 via TOPICAL

## 2016-12-07 MED ORDER — PROPOFOL 10 MG/ML IV BOLUS
INTRAVENOUS | Status: AC
  Filled 2016-12-07: qty 20

## 2016-12-07 MED ORDER — SODIUM CHLORIDE 0.9% FLUSH: 9.0000 mL | INTRAVENOUS | Status: DC | PRN

## 2016-12-07 MED ORDER — ONDANSETRON HCL 4 MG/2ML IJ SOLN: 4.0000 mg | Freq: Four times a day (QID) | INTRAMUSCULAR | Status: DC | PRN

## 2016-12-07 MED ORDER — CHLORHEXIDINE GLUCONATE CLOTH 2 % EX PADS
6.0000 | MEDICATED_PAD | Freq: Once | CUTANEOUS | Status: AC
  Administered 2016-12-07: 6 via TOPICAL

## 2016-12-07 MED ORDER — PROPOFOL 10 MG/ML IV BOLUS
INTRAVENOUS | Status: DC | PRN
  Administered 2016-12-07: 120 mg via INTRAVENOUS

## 2016-12-07 MED ORDER — MIDAZOLAM HCL 2 MG/2ML IJ SOLN
INTRAMUSCULAR | Status: AC
  Filled 2016-12-07: qty 2

## 2016-12-07 MED ORDER — SUGAMMADEX SODIUM 200 MG/2ML IV SOLN
INTRAVENOUS | Status: AC
Start: 2016-12-07 — End: ?
  Filled 2016-12-07: qty 2

## 2016-12-07 MED ORDER — SUCCINYLCHOLINE CHLORIDE 20 MG/ML IJ SOLN
INTRAMUSCULAR | Status: DC | PRN
  Administered 2016-12-07: 100 mg via INTRAVENOUS

## 2016-12-07 MED ORDER — DIPHENHYDRAMINE HCL 50 MG/ML IJ SOLN: 12.5000 mg | Freq: Four times a day (QID) | INTRAMUSCULAR | Status: DC | PRN

## 2016-12-07 MED ORDER — MUPIROCIN 2 % EX OINT
1.0000 "application " | TOPICAL_OINTMENT | Freq: Two times a day (BID) | CUTANEOUS | Status: AC
  Administered 2016-12-07 – 2016-12-12 (×10): 1 via NASAL
  Filled 2016-12-07: qty 22

## 2016-12-07 MED ORDER — ACETAMINOPHEN 10 MG/ML IV SOLN
INTRAVENOUS | Status: DC | PRN
  Administered 2016-12-07: 1000 mg via INTRAVENOUS

## 2016-12-07 MED ORDER — MIDAZOLAM HCL 2 MG/2ML IJ SOLN
INTRAMUSCULAR | Status: DC | PRN
  Administered 2016-12-07: 2 mg via INTRAVENOUS

## 2016-12-07 MED ORDER — LIDOCAINE HCL (CARDIAC) 20 MG/ML IV SOLN
INTRAVENOUS | Status: DC | PRN
  Administered 2016-12-07: 100 mg via INTRAVENOUS

## 2016-12-07 MED ORDER — ALBUMIN HUMAN 25 % IV SOLN
25.0000 g | Freq: Once | INTRAVENOUS | Status: AC
Start: 2016-12-07 — End: 2016-12-07
  Administered 2016-12-07: 25 g via INTRAVENOUS
  Filled 2016-12-07 (×2): qty 100

## 2016-12-07 MED ORDER — HYDROMORPHONE 1 MG/ML IV SOLN
INTRAVENOUS | Status: DC
  Administered 2016-12-07: 20:00:00 via INTRAVENOUS
  Administered 2016-12-08 (×2): 0.6 mg via INTRAVENOUS
  Administered 2016-12-08: 0.9 mg via INTRAVENOUS
  Administered 2016-12-09: 0.3 mg via INTRAVENOUS
  Administered 2016-12-09: 0.9 mg via INTRAVENOUS

## 2016-12-07 MED ORDER — LACTATED RINGERS IV SOLN
INTRAVENOUS | Status: DC
  Administered 2016-12-07 (×2): via INTRAVENOUS

## 2016-12-07 MED ORDER — SUGAMMADEX SODIUM 200 MG/2ML IV SOLN
INTRAVENOUS | Status: DC | PRN
  Administered 2016-12-07: 181.4 mg via INTRAVENOUS

## 2016-12-07 MED ORDER — ONDANSETRON HCL 4 MG/2ML IJ SOLN: 4.0000 mg | Freq: Once | INTRAMUSCULAR | Status: DC | PRN

## 2016-12-07 MED ORDER — NALOXONE HCL 0.4 MG/ML IJ SOLN: 0.4000 mg | INTRAMUSCULAR | Status: DC | PRN

## 2016-12-07 MED ORDER — LIDOCAINE HCL (PF) 2 % IJ SOLN
INTRAMUSCULAR | Status: AC
Start: 2016-12-07 — End: ?
  Filled 2016-12-07: qty 2

## 2016-12-07 MED ORDER — ROCURONIUM BROMIDE 50 MG/5ML IV SOLN
INTRAVENOUS | Status: AC
Start: 2016-12-07 — End: ?
  Filled 2016-12-07: qty 1

## 2016-12-07 MED ORDER — ONDANSETRON HCL 4 MG/2ML IJ SOLN
INTRAMUSCULAR | Status: AC
Start: 2016-12-07 — End: ?
  Filled 2016-12-07: qty 2

## 2016-12-07 MED ORDER — FENTANYL CITRATE (PF) 100 MCG/2ML IJ SOLN
INTRAMUSCULAR | Status: DC | PRN
  Administered 2016-12-07 (×4): 50 ug via INTRAVENOUS

## 2016-12-07 MED ORDER — EPHEDRINE SULFATE 50 MG/ML IJ SOLN
INTRAMUSCULAR | Status: DC | PRN
  Administered 2016-12-07: 5 mg via INTRAVENOUS
  Administered 2016-12-07: 10 mg via INTRAVENOUS

## 2016-12-07 MED ORDER — DEXAMETHASONE SODIUM PHOSPHATE 10 MG/ML IJ SOLN
INTRAMUSCULAR | Status: DC | PRN
  Administered 2016-12-07: 10 mg via INTRAVENOUS

## 2016-12-07 SURGICAL SUPPLY — 42 items
BULB RESERV EVAC DRAIN JP 100C (MISCELLANEOUS) ×6 IMPLANT
CANISTER SUCT 1200ML W/VALVE (MISCELLANEOUS) ×3 IMPLANT
CHLORAPREP W/TINT 26ML (MISCELLANEOUS) ×3 IMPLANT
DRAIN CHANNEL JP 15F RND 16 (MISCELLANEOUS) IMPLANT
DRAIN CHANNEL JP 19F (MISCELLANEOUS) ×6 IMPLANT
DRAPE LAPAROTOMY 100X77 ABD (DRAPES) ×3 IMPLANT
DRSG OPSITE POSTOP 4X12 (GAUZE/BANDAGES/DRESSINGS) ×3 IMPLANT
DRSG OPSITE POSTOP 4X8 (GAUZE/BANDAGES/DRESSINGS) ×3 IMPLANT
ELECT CAUTERY BLADE 6.4 (BLADE) ×3 IMPLANT
ELECT REM PT RETURN 9FT ADLT (ELECTROSURGICAL) ×3
ELECTRODE REM PT RTRN 9FT ADLT (ELECTROSURGICAL) ×2 IMPLANT
GAUZE SPONGE 4X4 12PLY STRL (GAUZE/BANDAGES/DRESSINGS) ×3 IMPLANT
GLOVE BIO SURGEON STRL SZ7.5 (GLOVE) ×6 IMPLANT
GLOVE INDICATOR 8.0 STRL GRN (GLOVE) ×3 IMPLANT
GOWN STRL REUS W/ TWL LRG LVL3 (GOWN DISPOSABLE) ×4 IMPLANT
GOWN STRL REUS W/TWL LRG LVL3 (GOWN DISPOSABLE) ×2
KIT RM TURNOVER STRD PROC AR (KITS) ×3 IMPLANT
LABEL OR SOLS (LABEL) ×3 IMPLANT
LIGASURE IMPACT 36 18CM CVD LR (INSTRUMENTS) ×3 IMPLANT
NS IRRIG 1000ML POUR BTL (IV SOLUTION) ×9 IMPLANT
NS IRRIG 500ML POUR BTL (IV SOLUTION) ×3 IMPLANT
PACK BASIN MAJOR ARMC (MISCELLANEOUS) ×3 IMPLANT
PACK COLON CLEAN CLOSURE (MISCELLANEOUS) ×3 IMPLANT
RETAINER VISCERA MED (MISCELLANEOUS) ×3 IMPLANT
SPONGE DRAIN TRACH 4X4 STRL 2S (GAUZE/BANDAGES/DRESSINGS) ×3 IMPLANT
STAPLER CUT CVD 40MM BLUE (STAPLE) ×3 IMPLANT
STAPLER PROXIMATE 75MM BLUE (STAPLE) ×3 IMPLANT
STAPLER SKIN PROX 35W (STAPLE) ×3 IMPLANT
SUT ETHILON 3-0 FS-10 30 BLK (SUTURE) ×6
SUT PDS AB 1 TP1 96 (SUTURE) ×3 IMPLANT
SUT PROLENE 2 0 SH DA (SUTURE) ×3 IMPLANT
SUT SILK 2 0 SH (SUTURE) ×3 IMPLANT
SUT SILK 2 0SH CR/8 30 (SUTURE) ×6 IMPLANT
SUT SILK 3 0 (SUTURE) ×1
SUT SILK 3-0 (SUTURE) ×1
SUT SILK 3-0 18XBRD TIE 12 (SUTURE) ×2 IMPLANT
SUT SILK 3-0 SH-1 18XCR BRD (SUTURE) ×2
SUT VIC AB 3-0 SH 27 (SUTURE) ×1
SUT VIC AB 3-0 SH 27X BRD (SUTURE) ×2 IMPLANT
SUTURE EHLN 3-0 FS-10 30 BLK (SUTURE) ×4 IMPLANT
SUTURE SILK 3-0 SH-1 18XCR BRD (SUTURE) ×2 IMPLANT
SYSTEM UROSTOMY GENTLE TOUCH (WOUND CARE) ×3 IMPLANT

## 2016-12-07 NOTE — Brief Op Note (Signed)
12/05/2016 - 12/07/2016  6:33 PM  PATIENT:  Cameron Foster.  76 y.o. male  PRE-OPERATIVE DIAGNOSIS:  Diverticulitis  POST-OPERATIVE DIAGNOSIS:  Diverticulitis  PROCEDURE:  Procedure(s): EXPLORATORY LAPAROTOMY (N/A) COLON RESECTION SIGMOID (N/A) COLOSTOMY (Left) DRAINAGE  OF INTRA ABDOMINAL ABSCESS (N/A)  SURGEON:  Surgeon(s) and Role:    Clayburn Pert, MD - Primary  PHYSICIAN ASSISTANT:   ASSISTANTS: none   ANESTHESIA:   general  EBL:  Total I/O In: 2296 [I.V.:2093; IV Piggyback:203] Out: 1300 [Urine:900; Blood:400]  BLOOD ADMINISTERED:none  DRAINS: (69f) Blake drain(s) in the left abdomen going into the pelvis and perihepatic spaces   LOCAL MEDICATIONS USED:  NONE  SPECIMEN:  Source of Specimen:  Sigmoid Colon  DISPOSITION OF SPECIMEN:  PATHOLOGY  COUNTS:  YES  TOURNIQUET:  * No tourniquets in log *  DICTATION: .Dragon Dictation  PLAN OF CARE: Returned inpatient  PATIENT DISPOSITION:  PACU - hemodynamically stable.   Delay start of Pharmacological VTE agent (>24hrs) due to surgical blood loss or risk of bleeding: no

## 2016-12-07 NOTE — Anesthesia Postprocedure Evaluation (Signed)
Anesthesia Post Note  Patient: Cameron Foster.  Procedure(s) Performed: Procedure(s) (LRB): EXPLORATORY LAPAROTOMY (N/A) COLON RESECTION SIGMOID (N/A) COLOSTOMY (Left) DRAINAGE  OF INTRA ABDOMINAL ABSCESS (N/A)  Patient location during evaluation: PACU Anesthesia Type: General Level of consciousness: awake and alert Pain management: pain level controlled Vital Signs Assessment: post-procedure vital signs reviewed and stable Respiratory status: spontaneous breathing and respiratory function stable Cardiovascular status: stable Anesthetic complications: no     Last Vitals:  Vitals:   12/07/16 1903 12/07/16 1909  BP: 122/82   Pulse: 96 97  Resp: 13 12  Temp:    SpO2: 95% 96%    Last Pain:  Vitals:   12/07/16 1909  TempSrc:   PainSc: 5                  Ottilie Wigglesworth K

## 2016-12-07 NOTE — Op Note (Signed)
Pre-operative Diagnosis: Ruptured diverticulitis   Post-operative Diagnosis: Same  Procedure performed: Exploratory laparotomy with sigmoid colectomy, end colostomy creation, drainage of intra-abdominal abscess.  Surgeon: Clayburn Pert   Assistants: None  Anesthesia: General endotracheal anesthesia  ASA Class: 3  Surgeon: Clayburn Pert, MD FACS  Anesthesia: Gen. with endotracheal tube  Assistant: None  Procedure Details  The patient was seen again in the Holding Room. The benefits, complications, treatment options, and expected outcomes were discussed with the patient. The risks of bleeding, infection, recurrence of symptoms, failure to resolve symptoms,  bowel injury, any of which could require further surgery were reviewed with the patient.   The patient was taken to Operating Room, identified as Cameron Foster. and the procedure verified.  A Time Out was held and the above information confirmed.  Prior to the induction of general anesthesia, antibiotic prophylaxis was administered. VTE prophylaxis was in place. General endotracheal anesthesia was then administered and tolerated well. After the induction, the abdomen was prepped with Chloraprep and draped in the sterile fashion. The patient was positioned in the low lithotomy position.  The procedure began with a midline incision with a 10 blade scalpel. Using Bovie electrocautery and blunt dissection was taken up the level of fascia. Fascia was entered into sharply and then opened up over its entire length with electrocautery. Copious amounts of purulent fluid and gas was immediately released from the midline. This was removed with suction. The entire abdomen was run from ligament of Treitz to the terminal ileum. The small bowel was noted to be normal with some fibropurulent attachments that were easily removed. The colon was also investigated and found to have a densely inflamed and thickened sigmoid colon with evidence of  posterior rupture. The sigmoid was noted to have dense adhesions likely related to previous radiation to the prostate.  The sigmoid colon was dissected free of the anterior abdominal wall using accommodation of blunt dissection and Bovie electrocautery. Once it was freed it was mobilized long ligament Treitz. A window was created proximal to the dense inflammation with electrocautery and the colon was cut with a 75 mm blue load GIA stapler. Using the LigaSure device the mesentery was taken along the colon rakes length until we were past the area of inflammation. The colon was then resected using a contour stapler on the distal end. The sigmoid colon was removed in toto with numerous areas of possible perforation within the specimen. The rectal stump was then marked with 2 sutures of 2-0 Prolene suture. One on the left and right aspect of the staple line.  We then returned our attention to the upper abdomen where additional purulent fluid was noted over the liver and along both pericolic gutters. This was removed with suction and the abdomen was copiously irrigated. The NG tube was palpated within the stomach and secured by anesthesia. Due to the copious amount of purulent fluid the decision to place drains was made. Two 19 French round Blake drains placed in the abdomen on the patient's right side with one going in the pelvis and up the left pericolic gutter. In the superior one going up over the liver.  Turning our attention to the blind end of the descending colon it was mobilized with electrocautery and LigaSure device until adequate length was determined to create an ostomy. An elliptical incision with left cautery was made in the left lower abdomen and taken down to the level of the fascia where a cruciate incision fascia was made.  This was bluntly dissected up to accept 2 fingers. The colon was then able to be delivered through the opening where it did not retract when it was released.  At this point  we proceeded with a clean closure. All members of the operative field broke scrub and returned We re-scrubbed. The abdomen was redraped and all sterile gloves use. The midline fascia was then closed with running looped #1 PDS sutures. Was tied in the middle and secured. This was done over the use of a fish that was easily removed before the suture was tied. The superficial tissue was then irrigated and the midline was closed with surgical staples. The end colostomy was then matured. The staple line was sharply excised with Metzenbaum scissors. And using multiple 2-0 silk sutures the colostomy was matured in a standard Brooke fashion. A 2-1/4 inch ostomy appliance was applied over this.  Patient tolerated the procedure well. All counts are correct at the end of the procedure. He was awoken from general endotracheal anesthesia without any difficulty. The drains were placed to bulb suction with drain sponges at the skin. The midline was dressed with a large honeycomb dressing. Patient was transferred to the PACU in good condition.  Findings: Ruptured sigmoid diverticulitis with intra-abdominal abscess   Estimated Blood Loss: 400 mL         Drains: 91 French round Blake drains 2         Specimens: Sigmoid colon          Complications: None                  Condition: Good   Clayburn Pert, MD, FACS

## 2016-12-07 NOTE — Anesthesia Post-op Follow-up Note (Signed)
Anesthesia QCDR form completed.        

## 2016-12-07 NOTE — Progress Notes (Signed)
CC: Diverticulitis Subjective: Patient admitted with diverticulitis. Clinically patient states he's feeling much better. His pain is decreased. He states he is hungry. He is passing gas. However, patient did have a fever overnight and has a climbing Nick blood cell count.  Objective: Vital signs in last 24 hours: Temp:  [98.1 F (36.7 C)-100.2 F (37.9 C)] 98.2 F (36.8 C) (08/22 0346) Pulse Rate:  [79-107] 96 (08/22 0346) Resp:  [14-20] 18 (08/22 0346) BP: (92-144)/(55-91) 131/91 (08/22 0346) SpO2:  [94 %-97 %] 97 % (08/22 0346) Last BM Date: 12/05/16  Intake/Output from previous day: 08/21 0701 - 08/22 0700 In: 3231 [I.V.:2145; IV Piggyback:1086] Out: 650 [Urine:650] Intake/Output this shift: Total I/O In: 640 [I.V.:540; IV Piggyback:100] Out: -   Physical exam:  Gen.: No acute distress Chest: Clear to auscultation Heart: Regular rhythm Abdomen: Soft, nondistended, mildly tender to deep palpation in the lower abdomen.  Lab Results: CBC   Recent Labs  12/06/16 0413 12/07/16 0357  WBC 11.5* 13.2*  HGB 11.5* 11.2*  HCT 34.4* 34.3*  PLT 219 204   BMET  Recent Labs  12/06/16 0413 12/07/16 0357  NA 138 140  K 4.6 4.2  CL 106 109  CO2 25 25  GLUCOSE 110* 115*  BUN 29* 40*  CREATININE 2.60* 3.42*  CALCIUM 8.2* 8.5*   PT/INR No results for input(s): LABPROT, INR in the last 72 hours. ABG No results for input(s): PHART, HCO3 in the last 72 hours.  Invalid input(s): PCO2, PO2  Studies/Results: Ct Renal Stone Study  Result Date: 12/05/2016 CLINICAL DATA:  Diffuse abdominal pain. EXAM: CT ABDOMEN AND PELVIS WITHOUT CONTRAST TECHNIQUE: Multidetector CT imaging of the abdomen and pelvis was performed following the standard protocol without IV contrast. COMPARISON:  CT abdomen and pelvis dated November 01, 2016. FINDINGS: Lower chest: No acute abnormality. Coronary artery atherosclerotic vascular calcifications. Hepatobiliary: No focal liver abnormality is seen. No  gallstones, gallbladder wall thickening, or biliary dilatation. Pancreas: Unremarkable. No pancreatic ductal dilatation or surrounding inflammatory changes. Spleen: Normal in size without focal abnormality. Adrenals/Urinary Tract: Adrenal glands are unremarkable. Multiple bilateral renal cysts are unchanged. No renal calculi, focal lesion, or hydronephrosis. Bladder is unremarkable. Stomach/Bowel: Extensive colonic diverticulosis with long segment sigmoid colon wall thickening and pericolonic fat stranding and fluid, consistent with diverticulitis. There are a few small foci of free air. No drainable fluid collection. The appendix is normal. The stomach and small bowel are within normal limits. Vascular/Lymphatic: Aortic atherosclerosis. No enlarged abdominal or pelvic lymph nodes. Reproductive: Unchanged brachytherapy seeds in the prostate gland. Other: Small amount of free fluid in the pelvis and adjacent to the liver. Musculoskeletal: No acute or significant osseous findings. Degenerative changes of the lumbar spine. IMPRESSION: 1. Findings consistent with sigmoid diverticulitis with probable microperforation. No drainable fluid collection. 2.  Aortic atherosclerosis (ICD10-I70.0). Electronically Signed   By: Titus Dubin M.D.   On: 12/05/2016 15:31    Anti-infectives: Anti-infectives    Start     Dose/Rate Route Frequency Ordered Stop   12/05/16 1600  piperacillin-tazobactam (ZOSYN) IVPB 3.375 g     3.375 g 100 mL/hr over 30 Minutes Intravenous  Once 12/05/16 1538 12/05/16 1559   12/05/16 0000  piperacillin-tazobactam (ZOSYN) IVPB 3.375 g     3.375 g 12.5 mL/hr over 240 Minutes Intravenous Every 8 hours 12/05/16 1721        Assessment/Plan:  76 year old male admitted with diverticulitis with microperforation. Had a fever overnight and has climbed Ullery blood cell count and worsening  renal function. Clinically he feels better. Given this mixed picture discussed the patient that we would repeat  his imaging this morning. We will also ask for internal medicine to see the patient for his worsening renal function. I suspect this is just  AKI secondary to the infection but I'll ask internal medicine for their assistance in workup and management. Pending CT results will determine course of action. I suspect he has formed an abscess which is why he feels better but his labs are worsening. If it is just an abscess I will ask that is amenable to percutaneous drainage, if it is something worse and we will discuss whether or not an urgent or emergent operation is warranted at that time.  Garl Speigner T. Adonis Huguenin, MD, [facility] General Surgeon [facility]  [facility] [phone] [facility] [phone] 12/07/2016

## 2016-12-07 NOTE — Anesthesia Procedure Notes (Signed)
Procedure Name: Intubation Date/Time: 12/07/2016 3:49 PM Performed by: Nelda Marseille Pre-anesthesia Checklist: Patient identified, Patient being monitored, Timeout performed, Emergency Drugs available and Suction available Patient Re-evaluated:Patient Re-evaluated prior to induction Oxygen Delivery Method: Circle system utilized Preoxygenation: Pre-oxygenation with 100% oxygen Induction Type: IV induction Ventilation: Mask ventilation without difficulty Laryngoscope Size: Mac and 3 Grade View: Grade III Tube type: Oral Tube size: 7.0 mm Number of attempts: 1 Airway Equipment and Method: Stylet Placement Confirmation: ETT inserted through vocal cords under direct vision,  positive ETCO2 and breath sounds checked- equal and bilateral Secured at: 21 cm Tube secured with: Tape Dental Injury: Teeth and Oropharynx as per pre-operative assessment

## 2016-12-07 NOTE — Progress Notes (Signed)
Seen and examined POD # 0 Doing well Good UO VSS  PE NAD Abd: dressing intact, serosanguinous drainage from JP, no peritontiis  A/P Doing well continue current care

## 2016-12-07 NOTE — Progress Notes (Signed)
  Revisiting with patient. Continues to have abdominal pain as earlier stated.  Reviewed CT scan results with the patient was shows progressively worsening diverticulitis with worsening rupture and free air know that was not present previously.  Discussed with the patient and his family the indications for performing an emergent sigmoid colectomy. The procedure was described in detail including the risks benefits and alternatives. They voiced understanding and desire to proceed. We will proceed to the operating room urgently as schedule permits.  Clayburn Pert, MD [facility]  [facility] [phone] [facility] [phone]

## 2016-12-07 NOTE — Anesthesia Preprocedure Evaluation (Signed)
Anesthesia Evaluation  Patient identified by MRN, date of birth, ID band Patient awake    Reviewed: Allergy & Precautions, NPO status , Patient's Chart, lab work & pertinent test results, reviewed documented beta blocker date and time   Airway Mallampati: II  TM Distance: >3 FB     Dental  (+) Chipped   Pulmonary asthma , former smoker,           Cardiovascular hypertension, Pt. on medications and Pt. on home beta blockers + dysrhythmias Atrial Fibrillation      Neuro/Psych PSYCHIATRIC DISORDERS Anxiety Depression    GI/Hepatic GERD  Controlled,  Endo/Other    Renal/GU      Musculoskeletal  (+) Arthritis ,   Abdominal   Peds  Hematology   Anesthesia Other Findings Hx of ETOH. Few episodes of A fib.  Reproductive/Obstetrics                             Anesthesia Physical Anesthesia Plan  ASA: III  Anesthesia Plan: General   Post-op Pain Management:    Induction: Intravenous  PONV Risk Score and Plan:   Airway Management Planned: Oral ETT  Additional Equipment:   Intra-op Plan:   Post-operative Plan:   Informed Consent: I have reviewed the patients History and Physical, chart, labs and discussed the procedure including the risks, benefits and alternatives for the proposed anesthesia with the patient or authorized representative who has indicated his/her understanding and acceptance.     Plan Discussed with: CRNA  Anesthesia Plan Comments:         Anesthesia Quick Evaluation

## 2016-12-07 NOTE — Transfer of Care (Signed)
Immediate Anesthesia Transfer of Care Note  Patient: Marti Acebo.  Procedure(s) Performed: Procedure(s): EXPLORATORY LAPAROTOMY (N/A) COLON RESECTION SIGMOID (N/A) COLOSTOMY (Left) DRAINAGE  OF INTRA ABDOMINAL ABSCESS (N/A)  Patient Location: PACU  Anesthesia Type:General  Level of Consciousness: awake and patient cooperative  Airway & Oxygen Therapy: Patient Spontanous Breathing and Patient connected to face mask oxygen  Post-op Assessment: Report given to RN and Post -op Vital signs reviewed and stable  Post vital signs: Reviewed and stable  Last Vitals:  Vitals:   12/07/16 1221 12/07/16 1359  BP: 131/81 131/84  Pulse: 83 82  Resp:  16  Temp: 36.6 C (!) 36.2 C  SpO2: 97% 95%    Last Pain:  Vitals:   12/07/16 1359  TempSrc: Tympanic  PainSc: 3       Patients Stated Pain Goal: 0 (80/01/23 9359)  Complications: No apparent anesthesia complications

## 2016-12-08 ENCOUNTER — Encounter: Payer: Self-pay | Admitting: General Surgery

## 2016-12-08 LAB — CBC
HEMATOCRIT: 33.4 % — AB (ref 40.0–52.0)
Hemoglobin: 11.1 g/dL — ABNORMAL LOW (ref 13.0–18.0)
MCH: 27.3 pg (ref 26.0–34.0)
MCHC: 33.2 g/dL (ref 32.0–36.0)
MCV: 82.2 fL (ref 80.0–100.0)
PLATELETS: 229 10*3/uL (ref 150–440)
RBC: 4.07 MIL/uL — ABNORMAL LOW (ref 4.40–5.90)
RDW: 15.6 % — AB (ref 11.5–14.5)
WBC: 12.5 10*3/uL — AB (ref 3.8–10.6)

## 2016-12-08 LAB — BASIC METABOLIC PANEL
ANION GAP: 6 (ref 5–15)
BUN: 36 mg/dL — ABNORMAL HIGH (ref 6–20)
CALCIUM: 8.6 mg/dL — AB (ref 8.9–10.3)
CO2: 22 mmol/L (ref 22–32)
CREATININE: 2.72 mg/dL — AB (ref 0.61–1.24)
Chloride: 107 mmol/L (ref 101–111)
GFR, EST AFRICAN AMERICAN: 25 mL/min — AB (ref >60)
GFR, EST NON AFRICAN AMERICAN: 21 mL/min — AB (ref >60)
Glucose, Bld: 181 mg/dL — ABNORMAL HIGH (ref 65–99)
Potassium: 4.2 mmol/L (ref 3.5–5.1)
Sodium: 135 mmol/L (ref 135–145)

## 2016-12-08 NOTE — Plan of Care (Signed)
Problem: Activity: Goal: Ability to tolerate increased activity will improve Outcome: Progressing Patient has been up to chair today

## 2016-12-08 NOTE — Evaluation (Signed)
Physical Therapy Evaluation Patient Details Name: Cameron Foster. MRN: [id number] DOB: Feb 21, 1941 Today's Date: 12/08/2016   History of Present Illness  Pt is a 76 yo M, admitted to acute care on 8/20 with diverticulitis. Pt underwent exploratory laparotomy with colon resection and colostomy on 8/22; currently p/o day 1. Prior to [place], pt independent with all ADL's, requiring no AD. PMH: alcoholism, arrhythmia, arthritis, asthma, A-fib, colon polyp, depression, diverticulosis, fainting GERD, hx of blood clots, HLD, IBS, prostate cancer, vasovagal syncope, and vertigo.   Clinical Impression  Pt is pleasant and willing to participate for return to PLOF. Pt performs bed mobility with minA, and tranfers and ambulation with CGA, due to impaired strength, endurance, balance, and pain. However, pt is primarily limited by pain to abdomin. Pt able to amb total of 80 ft B UE support from RW and no rest break. Pt with mild SOB at end of amb. Overall, pt responded well to today's treatment with no adverse affects. Pt would benefit from skilled PT to address the previously mentioned impairments and promote return to PLOF. Currently recommending HHPT, pending d/c.     Follow Up Recommendations Home health PT    Equipment Recommendations  None recommended by PT    Recommendations for Other Services       Precautions / Restrictions Precautions Precautions: Fall Restrictions Weight Bearing Restrictions: No      Mobility  Bed Mobility Overal bed mobility: Needs Assistance Bed Mobility: Supine to Sit     Supine to sit: Min assist     General bed mobility comments: MinA with supine to sit, requiring HHA only. Pt utilize bed rail with free hand; assist primarily due to abdominal pain. Increased time needed and HOB elevated .Min cues for mechanics, provided; primarily for safety with lines and leads.   Transfers Overall transfer level: Needs assistance Equipment used: Rolling walker (2  wheeled) Transfers: Sit to/from Stand Sit to Stand: Min guard         General transfer comment: CGA with STS, requring increased time and cues for hand placement. Pt follows cues well. Pt moaning upon standing, as transfers result in increased abdominal pain.   Ambulation/Gait Ambulation/Gait assistance: Min guard Ambulation Distance (Feet): 80 Feet Assistive device: Rolling walker (2 wheeled) Gait Pattern/deviations: Step-through pattern     General Gait Details: Pt amb 80 ft with no rest break. Min cues for staying close to RW with amb and safely performing turns. Pt with increased postural sway during turn; but able to self correct. Pt with good reciprocal gait pattern.   Stairs            Wheelchair Mobility    Modified Rankin (Stroke Patients Only)       Balance Overall balance assessment: Needs assistance Sitting-balance support: Bilateral upper extremity supported;Feet supported Sitting balance-Leahy Scale: Good Sitting balance - Comments: Good sitting balance, with slight increased postural sway, but able to self correct with supervision    Standing balance support: Bilateral upper extremity supported Standing balance-Leahy Scale: Fair Standing balance comment: Fair standing balance, requiring B UE suport from RW. Increased postureal sway upon stadning, but able to self correct. Requires CGA for static standing.                             Pertinent Vitals/Pain Pain Assessment: 0-10 Pain Score: 8  Pain Location: abdominals with movement  Pain Descriptors / Indicators: Discomfort;Moaning;Guarding;Operative site guarding Pain Intervention(s): Limited activity  within patient's tolerance;Monitored during session;Premedicated before session    [place] expects to be discharged to:: Private residence Living Arrangements: Spouse/significant other Available Help at Discharge: Family;Available 24 hours/day Type of Home: House Home  Access: Level entry     Home Layout: One level Home Equipment: Walker - 2 wheels      Prior Function Level of Independence: Independent         Comments: Pt reports being independent with all ADL's. Says spouse is capable of assisting with ADL's and hand held assist to sit up in bed if necessary.      Hand Dominance        Extremity/Trunk Assessment   Upper Extremity Assessment Upper Extremity Assessment: Overall WFL for tasks assessed    Lower Extremity Assessment Lower Extremity Assessment: Generalized weakness (MMT to B LE's grossly 4/5 )       Communication   Communication: No difficulties  Cognition Arousal/Alertness: Awake/alert Behavior During Therapy: WFL for tasks assessed/performed Overall Cognitive Status: Within Functional Limits for tasks assessed                                        General Comments      Exercises Other Exercises Other Exercises: Supine therex performed with supervision to B LE's x 10 reps: ankle pumps, SLR, hip abd, and glute sets. Pt with good mechanics, requiring no cues.    Assessment/Plan    PT Assessment Patient needs continued PT services  PT Problem List Decreased strength;Decreased activity tolerance;Decreased balance;Decreased mobility;Pain       PT Treatment Interventions DME instruction;Gait training;Functional mobility training;Therapeutic activities;Therapeutic exercise;Balance training;Neuromuscular re-education;Patient/family education;Manual techniques    PT Goals (Current goals can be found in the Care Plan section)  Acute Rehab PT Goals Patient Stated Goal: to go home  PT Goal Formulation: With patient/family Time For Goal Achievement: 12/22/16 Potential to Achieve Goals: Good    Frequency Min 2X/week   Barriers to discharge        Co-evaluation               AM-PAC PT "6 Clicks" Daily Activity  Outcome Measure Difficulty turning over in bed (including adjusting  bedclothes, sheets and blankets)?: Unable Difficulty moving from lying on back to sitting on the side of the bed? : Unable Difficulty sitting down on and standing up from a chair with arms (e.g., wheelchair, bedside commode, etc,.)?: Unable Help needed moving to and from a bed to chair (including a wheelchair)?: A Little Help needed walking in hospital room?: A Little Help needed climbing 3-5 steps with a railing? : A Lot 6 Click Score: 11    End of Session Equipment Utilized During Treatment: Gait belt Activity Tolerance: Patient tolerated treatment well Patient left: in bed;with call bell/phone within reach;with bed alarm set;with family/visitor present Nurse Communication: Mobility status;Other (comment) (Pt c/o of feeling like NG tube coming out) PT Visit Diagnosis: Unsteadiness on feet (R26.81);Muscle weakness (generalized) (M62.81);Other abnormalities of gait and mobility (R26.89);Pain    Time: 1062-6948 PT Time Calculation (min) (ACUTE ONLY): 36 min   Charges:         PT G Codes:        Oran Rein PT, SPT  Bevelyn Ngo 12/08/2016, 5:54 PM

## 2016-12-08 NOTE — Progress Notes (Signed)
1 Day Post-Op   Subjective:  Patient reports doing well today. Having appropriate abdominal pain but states the pain medications working.  Vital signs in last 24 hours: Temp:  [97.2 F (36.2 C)-98.3 F (36.8 C)] 98.1 F (36.7 C) (08/23 1219) Pulse Rate:  [81-104] 87 (08/23 1219) Resp:  [8-20] 18 (08/23 1219) BP: (122-151)/(75-100) 130/84 (08/23 1219) SpO2:  [94 %-99 %] 98 % (08/23 1219) Weight:  [90.7 kg (200 lb)] 90.7 kg (200 lb) (08/22 1359) Last BM Date: 12/05/16  Intake/Output from previous day: 08/22 0701 - 08/23 0700 In: 3261 [I.V.:3028; IV Piggyback:233] Out: 2570 [Urine:1750; Emesis/NG output:5; Drains:415; Blood:400]  GI: Abdomen is soft, appropriately tender to palpation, nondistended. Midline dressing is clean, dry, intact. JP drains 2 in the right abdomen with serosanguineous fluid. End colostomy in the left lower quadrant that is pink and patent.  Lab Results:  CBC  Recent Labs  12/07/16 0357 12/08/16 0718  WBC 13.2* 12.5*  HGB 11.2* 11.1*  HCT 34.3* 33.4*  PLT 204 229   CMP     Component Value Date/Time   NA 135 12/08/2016 0718   K 4.2 12/08/2016 0718   CL 107 12/08/2016 0718   CO2 22 12/08/2016 0718   GLUCOSE 181 (H) 12/08/2016 0718   BUN 36 (H) 12/08/2016 0718   CREATININE 2.72 (H) 12/08/2016 0718   CREATININE 1.40 (H) 08/12/2015 1632   CALCIUM 8.6 (L) 12/08/2016 0718   PROT 6.3 (L) 12/06/2016 0413   ALBUMIN 2.9 (L) 12/06/2016 0413   AST 34 12/06/2016 0413   ALT 12 (L) 12/06/2016 0413   ALKPHOS 42 12/06/2016 0413   BILITOT 1.1 12/06/2016 0413   GFRNONAA 21 (L) 12/08/2016 0718   GFRAA 25 (L) 12/08/2016 0718   PT/INR No results for input(s): LABPROT, INR in the last 72 hours.  Studies/Results: Ct Abdomen Pelvis Wo Contrast  Result Date: 12/07/2016 CLINICAL DATA:  Followup diverticulitis. EXAM: CT ABDOMEN AND PELVIS WITHOUT CONTRAST TECHNIQUE: Multidetector CT imaging of the abdomen and pelvis was performed following the standard protocol  without IV contrast. COMPARISON:  12/05/2016 FINDINGS: Lower chest: Trace bilateral pleural effusions. Hepatobiliary: No focal liver abnormality. The gallbladder appears normal. No biliary dilatation. Pancreas: Unremarkable. No pancreatic ductal dilatation or surrounding inflammatory changes. Spleen: Normal in size without focal abnormality. Adrenals/Urinary Tract: The adrenal glands are normal. Bilateral kidney cysts are again noted. No hydronephrosis. The urinary bladder is collapsed around a Foley catheter balloon. Mottled gas is identified within the dome of bladder. Nonspecific. Stomach/Bowel: Normal appearance of the stomach. No pathologic dilatation of the small bowel loops. The proximal colon appears normal. Abnormal wall thickening and inflammation associated with the sigmoid colon is again noted compatible with acute diverticulitis. Vascular/Lymphatic: There is aortic atherosclerosis. No aneurysm. No adenopathy scratch set multiple prominent lymph nodes identified within the upper abdomen. No adenopathy. No pelvic or inguinal adenopathy. Reproductive: Seed implants noted within the prostate gland. Other: Increase in abdominal and pelvic ascites. No discrete drainable fluid collection identified at this time. There is evidence of pneumoperitoneum which is a new finding when compared with the previous exam indicating presence of free perforation. Musculoskeletal: Degenerative disc disease noted within the lower thoracic and lumbar spine. IMPRESSION: 1. New pneumoperitoneum is identified within the abdomen. Findings are compatible with bowel perforation secondary to diverticulitis. Not apparent on study from 12/05/2016. 2. Increase in free fluid within the abdomen and pelvis. No discrete drainable fluid collection identified at this time. 3. Persistent inflammatory changes and wall thickening associated with  the sigmoid colon. 4. Urinary bladder is partially collapsed around a Foley catheter. Bowel gas within  the dome of bladder is nonspecific. The appearance could be seen however with colovesical fistula. Attention this on follow-up imaging is advised. 5.  Aortic Atherosclerosis (ICD10-I70.0). Electronically Signed   By: Kerby Moors M.D.   On: 12/07/2016 10:41    Assessment/Plan: 76 year old male 1 day status post sigmoid colectomy with end colostomy creation. Doing well. Discussed the typical today would be to get out of bed to chair. Discussed the importance of NG tube decompression today. Also discussed the operative findings again as well as the anticipated recovery. Encourage incentive spirometer usage.   Clayburn Pert, MD [facility]  Day [facility] [phone] Night [facility] [phone]  12/08/2016

## 2016-12-09 LAB — BASIC METABOLIC PANEL
Anion gap: 6 (ref 5–15)
BUN: 33 mg/dL — AB (ref 6–20)
CHLORIDE: 109 mmol/L (ref 101–111)
CO2: 24 mmol/L (ref 22–32)
Calcium: 8.6 mg/dL — ABNORMAL LOW (ref 8.9–10.3)
Creatinine, Ser: 2.15 mg/dL — ABNORMAL HIGH (ref 0.61–1.24)
GFR calc Af Amer: 33 mL/min — ABNORMAL LOW (ref >60)
GFR calc non Af Amer: 28 mL/min — ABNORMAL LOW (ref >60)
GLUCOSE: 154 mg/dL — AB (ref 65–99)
POTASSIUM: 4 mmol/L (ref 3.5–5.1)
Sodium: 139 mmol/L (ref 135–145)

## 2016-12-09 LAB — CBC
HEMATOCRIT: 31.9 % — AB (ref 40.0–52.0)
Hemoglobin: 10.5 g/dL — ABNORMAL LOW (ref 13.0–18.0)
MCH: 27.8 pg (ref 26.0–34.0)
MCHC: 33.1 g/dL (ref 32.0–36.0)
MCV: 83.9 fL (ref 80.0–100.0)
Platelets: 229 10*3/uL (ref 150–440)
RBC: 3.8 MIL/uL — AB (ref 4.40–5.90)
RDW: 15.8 % — ABNORMAL HIGH (ref 11.5–14.5)
WBC: 12.4 10*3/uL — ABNORMAL HIGH (ref 3.8–10.6)

## 2016-12-09 LAB — SURGICAL PATHOLOGY

## 2016-12-09 MED ORDER — ENOXAPARIN SODIUM 40 MG/0.4ML ~~LOC~~ SOLN
40.0000 mg | SUBCUTANEOUS | Status: DC
  Administered 2016-12-09 – 2016-12-13 (×5): 40 mg via SUBCUTANEOUS
  Filled 2016-12-09 (×5): qty 0.4

## 2016-12-09 MED ORDER — HYDROMORPHONE HCL 1 MG/ML IJ SOLN
0.5000 mg | INTRAMUSCULAR | Status: DC | PRN
  Administered 2016-12-09 – 2016-12-11 (×5): 0.5 mg via INTRAVENOUS
  Filled 2016-12-09 (×2): qty 1
  Filled 2016-12-09: qty 0.5
  Filled 2016-12-09 (×3): qty 1

## 2016-12-09 MED ORDER — HYDROCODONE-ACETAMINOPHEN 7.5-325 MG/15ML PO SOLN
10.0000 mL | Freq: Four times a day (QID) | ORAL | Status: DC | PRN
  Administered 2016-12-09 – 2016-12-14 (×10): 10 mL via ORAL
  Filled 2016-12-09 (×11): qty 15

## 2016-12-09 NOTE — Progress Notes (Signed)
Physical Therapy Treatment Patient Details Name: Cameron Foster. MRN: [id number] DOB: Apr 28, 1940 Today's Date: 12/09/2016    History of Present Illness Pt is a 76 yo M, admitted to acute care on 8/20 with diverticulitis. Pt underwent exploratory laparotomy with colon resection and colostomy on 8/22; currently p/o day 1. Prior to [place], pt independent with all ADL's, requiring no AD. PMH: alcoholism, arrhythmia, arthritis, asthma, A-fib, colon polyp, depression, diverticulosis, fainting GERD, hx of blood clots, HLD, IBS, prostate cancer, vasovagal syncope, and vertigo.     PT Comments    Pt is pleasant and willing to participate for return to PLOF. Pt performs tranfers and ambulation with supervision, secondary to impaired strength, endurance, balance, and pain. Pt amb total of 40 ft in room today, requiring no cues for mechanics and safety; primarily limited by pain with mobility. Overall, pt responded well to today's treatment with no adverse affects, and is progressing well towards functional goals. Pt would benefit from skilled PT to address the previously mentioned impairments and promote return to PLOF. Currently recommending HHPT, pending d/c.     Follow Up Recommendations  Home health PT     Equipment Recommendations  None recommended by PT    Recommendations for Other Services       Precautions / Restrictions Precautions Precautions: Fall Restrictions Weight Bearing Restrictions: No    Mobility  Bed Mobility               General bed mobility comments: Pt up in chair upon arrival. Bed mobility deferred.   Transfers Overall transfer level: Needs assistance Equipment used: Rolling walker (2 wheeled) Transfers: Sit to/from Stand Sit to Stand: Supervision         General transfer comment: Pt supervision with STS requiring increased time andmin cues for mechanics ans safety. Pt with good carryover  Ambulation/Gait Ambulation/Gait assistance:  Supervision Ambulation Distance (Feet): 40 Feet Assistive device: Rolling walker (2 wheeled) Gait Pattern/deviations: Step-through pattern     General Gait Details: Pt amb 40 ft with supervision, requiring no cues for mechanics or safety. Pt with increased time to perform task. Good carryover with tasks.    Stairs            Wheelchair Mobility    Modified Rankin (Stroke Patients Only)       Balance                                            Cognition Arousal/Alertness: Awake/alert Behavior During Therapy: WFL for tasks assessed/performed Overall Cognitive Status: Within Functional Limits for tasks assessed                                        Exercises Other Exercises Other Exercises: Seated therex performed to B LE's x12 reps with supervision: ankle pumps, quad sets, glute sets, marches, and LAQ's. STS x5 reps. Pt with good mechanics, requiring no cues for mechanics/safety.     General Comments        Pertinent Vitals/Pain Pain Assessment: 0-10 Pain Score: 2  Pain Location: abdominals with movement  Pain Descriptors / Indicators: Discomfort;Guarding;Operative site guarding Pain Intervention(s): Limited activity within patient's tolerance;Monitored during session;Premedicated before session    Home Living  Prior Function            PT Goals (current goals can now be found in the care plan section) Acute Rehab PT Goals Patient Stated Goal: to go home  PT Goal Formulation: With patient/family Time For Goal Achievement: 12/22/16 Potential to Achieve Goals: Good Progress towards PT goals: Progressing toward goals    Frequency    Min 2X/week      PT Plan Current plan remains appropriate    Co-evaluation              AM-PAC PT "6 Clicks" Daily Activity  Outcome Measure  Difficulty turning over in bed (including adjusting bedclothes, sheets and blankets)?: Unable Difficulty  moving from lying on back to sitting on the side of the bed? : Unable Difficulty sitting down on and standing up from a chair with arms (e.g., wheelchair, bedside commode, etc,.)?: A Little Help needed moving to and from a bed to chair (including a wheelchair)?: A Little Help needed walking in hospital room?: A Little Help needed climbing 3-5 steps with a railing? : A Little 6 Click Score: 14    End of Session Equipment Utilized During Treatment: Gait belt Activity Tolerance: Patient tolerated treatment well Patient left: in chair;with call bell/phone within reach;with chair alarm set;with family/visitor present Nurse Communication: Mobility status PT Visit Diagnosis: Unsteadiness on feet (R26.81);Muscle weakness (generalized) (M62.81);Other abnormalities of gait and mobility (R26.89);Pain     Time: 8721-5872 PT Time Calculation (min) (ACUTE ONLY): 23 min  Charges:                       G Codes:       Oran Rein PT, SPT   Bevelyn Ngo 12/09/2016, 5:22 PM

## 2016-12-09 NOTE — Consult Note (Signed)
[facility] Nurse ostomy consult note Stoma type/location: LLQ end Colostomy Stomal assessment/size: 1 1/2" pink and moist.  Blood tinged liquid in the pouch. NG tube in place.  No family at bedside but will perform first pouch change today.  Patient is alert and oriented and participates in teaching.  Plans to meet with wife for next pouch change MOnday at 10 AM.   Peristomal assessment: intact.  Abdomen edematous and firm.  Barrier opening cut larger and barrier ring applied to improve wear time.  Treatment options for stomal/peristomal skin: barrier ring Output blood tinged liquid Ostomy pouching: 2pc. 2 3/4" pouch with barrier ring  Education provided: POuch change performed.  DIscussed frequency of pouch change, emptying when 1/3 full and purpose of barrier ring.  Barrier is cut along surgical site line to accomodate dressing.  Enrolled patient in [place] program: No WOC team will follow.   Domenic Moras RN BSN [place] Pager [phone]

## 2016-12-09 NOTE — Progress Notes (Signed)
2 Days Post-Op   Subjective:  Patient did well overnight. Denies any acute events.  Vital signs in last 24 hours: Temp:  [98.1 F (36.7 C)-98.5 F (36.9 C)] 98.2 F (36.8 C) (08/24 0423) Pulse Rate:  [84-89] 89 (08/24 0423) Resp:  [14-19] 16 (08/24 0756) BP: (125-159)/(83-88) 159/88 (08/24 0423) SpO2:  [94 %-98 %] 97 % (08/24 0756) Last BM Date: 12/05/16  Intake/Output from previous day: 08/23 0701 - 08/24 0700 In: 3191.4 [I.V.:3036.4; IV Piggyback:155] Out: 2305 [Urine:1525; Emesis/NG output:500; Drains:280]  GI: Abdomen is soft, appropriately tender to palpation at the incision sites, nondistended. Colostomy present left lower quadrant with bowel sweat but no evidence of gas or stool yet. Healthy-appearing mucosa. Midline incision well approximated without evidence of erythema or drainage. JP drains in the right abdomen with serosanguineous fluid.  Lab Results:  CBC  Recent Labs  12/08/16 0718 12/09/16 0333  WBC 12.5* 12.4*  HGB 11.1* 10.5*  HCT 33.4* 31.9*  PLT 229 229   CMP     Component Value Date/Time   NA 139 12/09/2016 0333   K 4.0 12/09/2016 0333   CL 109 12/09/2016 0333   CO2 24 12/09/2016 0333   GLUCOSE 154 (H) 12/09/2016 0333   BUN 33 (H) 12/09/2016 0333   CREATININE 2.15 (H) 12/09/2016 0333   CREATININE 1.40 (H) 08/12/2015 1632   CALCIUM 8.6 (L) 12/09/2016 0333   PROT 6.3 (L) 12/06/2016 0413   ALBUMIN 2.9 (L) 12/06/2016 0413   AST 34 12/06/2016 0413   ALT 12 (L) 12/06/2016 0413   ALKPHOS 42 12/06/2016 0413   BILITOT 1.1 12/06/2016 0413   GFRNONAA 28 (L) 12/09/2016 0333   GFRAA 33 (L) 12/09/2016 0333   PT/INR No results for input(s): LABPROT, INR in the last 72 hours.  Studies/Results: Ct Abdomen Pelvis Wo Contrast  Result Date: 12/07/2016 CLINICAL DATA:  Followup diverticulitis. EXAM: CT ABDOMEN AND PELVIS WITHOUT CONTRAST TECHNIQUE: Multidetector CT imaging of the abdomen and pelvis was performed following the standard protocol without IV  contrast. COMPARISON:  12/05/2016 FINDINGS: Lower chest: Trace bilateral pleural effusions. Hepatobiliary: No focal liver abnormality. The gallbladder appears normal. No biliary dilatation. Pancreas: Unremarkable. No pancreatic ductal dilatation or surrounding inflammatory changes. Spleen: Normal in size without focal abnormality. Adrenals/Urinary Tract: The adrenal glands are normal. Bilateral kidney cysts are again noted. No hydronephrosis. The urinary bladder is collapsed around a Foley catheter balloon. Mottled gas is identified within the dome of bladder. Nonspecific. Stomach/Bowel: Normal appearance of the stomach. No pathologic dilatation of the small bowel loops. The proximal colon appears normal. Abnormal wall thickening and inflammation associated with the sigmoid colon is again noted compatible with acute diverticulitis. Vascular/Lymphatic: There is aortic atherosclerosis. No aneurysm. No adenopathy scratch set multiple prominent lymph nodes identified within the upper abdomen. No adenopathy. No pelvic or inguinal adenopathy. Reproductive: Seed implants noted within the prostate gland. Other: Increase in abdominal and pelvic ascites. No discrete drainable fluid collection identified at this time. There is evidence of pneumoperitoneum which is a new finding when compared with the previous exam indicating presence of free perforation. Musculoskeletal: Degenerative disc disease noted within the lower thoracic and lumbar spine. IMPRESSION: 1. New pneumoperitoneum is identified within the abdomen. Findings are compatible with bowel perforation secondary to diverticulitis. Not apparent on study from 12/05/2016. 2. Increase in free fluid within the abdomen and pelvis. No discrete drainable fluid collection identified at this time. 3. Persistent inflammatory changes and wall thickening associated with the sigmoid colon. 4. Urinary bladder is  partially collapsed around a Foley catheter. Bowel gas within the dome  of bladder is nonspecific. The appearance could be seen however with colovesical fistula. Attention this on follow-up imaging is advised. 5.  Aortic Atherosclerosis (ICD10-I70.0). Electronically Signed   By: Kerby Moors M.D.   On: 12/07/2016 10:41    Assessment/Plan: 76 year old male postop day #2 from sigmoid colectomy for ruptured diverticulitis. Doing well. Plan to discontinue PCA today and transitioned as needed medications. Encourage ambulation and incentive spirometer usage. Await return of bowel function.   Clayburn Pert, MD [facility]  Day [facility] [phone] Night ASC[facility] [phone]  12/09/2016

## 2016-12-09 NOTE — Progress Notes (Signed)
PHARMACIST - PHYSICIAN COMMUNICATION  CONCERNING:  Enoxaparin (Lovenox) for DVT Prophylaxis    RECOMMENDATION: Patient was prescribed enoxaprin 79m q24 hours for VTE prophylaxis.   Filed Weights   12/05/16 1132 12/07/16 1359  Weight: 200 lb (90.7 kg) 200 lb (90.7 kg)    Body mass index is 29.53 kg/m.  Estimated Creatinine Clearance: 32.5 mL/min (A) (by C-G formula based on SCr of 2.15 mg/dL (H)).  Patient is candidate for enoxaparin 332mevery 24 hours based on CrCl <3063min   DESCRIPTION: Pharmacy has adjusted enoxaparin dose.  Patient is now receiving enoxaparin 32m40mery 24 hours.  Pharmacy will monitor renal function and increase dose of enoxaparin if renal function improves .   8/24 SM CrCl now >30. Lovenox changed to 40 mg daily.  MattSim BoastarmD, BCPS  12/09/16 5:10 AM

## 2016-12-10 LAB — BASIC METABOLIC PANEL
Anion gap: 7 (ref 5–15)
BUN: 24 mg/dL — ABNORMAL HIGH (ref 6–20)
CO2: 26 mmol/L (ref 22–32)
Calcium: 8.8 mg/dL — ABNORMAL LOW (ref 8.9–10.3)
Chloride: 104 mmol/L (ref 101–111)
Creatinine, Ser: 1.59 mg/dL — ABNORMAL HIGH (ref 0.61–1.24)
GFR, EST AFRICAN AMERICAN: 47 mL/min — AB (ref >60)
GFR, EST NON AFRICAN AMERICAN: 41 mL/min — AB (ref >60)
GLUCOSE: 99 mg/dL (ref 65–99)
POTASSIUM: 3.3 mmol/L — AB (ref 3.5–5.1)
SODIUM: 137 mmol/L (ref 135–145)

## 2016-12-10 LAB — CBC
HEMATOCRIT: 33.3 % — AB (ref 40.0–52.0)
HEMOGLOBIN: 11.2 g/dL — AB (ref 13.0–18.0)
MCH: 28.2 pg (ref 26.0–34.0)
MCHC: 33.7 g/dL (ref 32.0–36.0)
MCV: 83.8 fL (ref 80.0–100.0)
Platelets: 216 10*3/uL (ref 150–440)
RBC: 3.97 MIL/uL — ABNORMAL LOW (ref 4.40–5.90)
RDW: 15.6 % — AB (ref 11.5–14.5)
WBC: 8.7 10*3/uL (ref 3.8–10.6)

## 2016-12-10 NOTE — Progress Notes (Signed)
3 Days Post-Op   Subjective:  Patient reports a had a good night. No acute events. Denies any nausea, vomiting and his pain is well-controlled.  Vital signs in last 24 hours: Temp:  [98.1 F (36.7 C)] 98.1 F (36.7 C) (08/25 0617) Pulse Rate:  [77-88] 77 (08/25 0617) Resp:  [16-18] 18 (08/24 2046) BP: (129-166)/(85-102) 138/85 (08/25 0617) SpO2:  [96 %-97 %] 97 % (08/25 0617) Last BM Date: 12/05/16  Intake/Output from previous day: 08/24 0701 - 08/25 0700 In: 2588.7 [I.V.:2508.7; NG/GT:30; IV Piggyback:50] Out: 2705 [Urine:2300; Emesis/NG output:250; Drains:155]  GI: Abdomen soft, appropriately tender at the incision sites, nondistended. Staples in place to the midline without any evidence of erythema or drainage. Colostomy present in the left lower quadrant with sweat present in the bag with no evidence of gas or stool yet. JP drains 2 in the right abdomen with the serosanguineous output.  Lab Results:  CBC  Recent Labs  12/09/16 0333 12/10/16 0452  WBC 12.4* 8.7  HGB 10.5* 11.2*  HCT 31.9* 33.3*  PLT 229 216   CMP     Component Value Date/Time   NA 137 12/10/2016 0452   K 3.3 (L) 12/10/2016 0452   CL 104 12/10/2016 0452   CO2 26 12/10/2016 0452   GLUCOSE 99 12/10/2016 0452   BUN 24 (H) 12/10/2016 0452   CREATININE 1.59 (H) 12/10/2016 0452   CREATININE 1.40 (H) 08/12/2015 1632   CALCIUM 8.8 (L) 12/10/2016 0452   PROT 6.3 (L) 12/06/2016 0413   ALBUMIN 2.9 (L) 12/06/2016 0413   AST 34 12/06/2016 0413   ALT 12 (L) 12/06/2016 0413   ALKPHOS 42 12/06/2016 0413   BILITOT 1.1 12/06/2016 0413   GFRNONAA 41 (L) 12/10/2016 0452   GFRAA 47 (L) 12/10/2016 0452   PT/INR No results for input(s): LABPROT, INR in the last 72 hours.  Studies/Results: No results found.  Assessment/Plan: 76 year old male status post sigmoid colectomy with end colostomy for diverticulitis with perforation. Continues to improve. NG tube removed this morning per my order. Plan to continue  nothing by mouth except for ice chips today. Encourage ambulation and incentive spirometer usage. Await return of bowel function. Continue IV antibiotics until tolerating a diet at which point he'll be transitioned to oral antibiotics to complete a 10 day course. Continue Foley catheter and additional day due to difficulty with placement that required cystoscopy.   Clayburn Pert, MD [facility]  Day ASCOM [phone] Night ASCOM [phone]  12/10/2016

## 2016-12-11 LAB — BASIC METABOLIC PANEL
Anion gap: 6 (ref 5–15)
BUN: 16 mg/dL (ref 6–20)
CALCIUM: 8.5 mg/dL — AB (ref 8.9–10.3)
CO2: 29 mmol/L (ref 22–32)
CREATININE: 1.41 mg/dL — AB (ref 0.61–1.24)
Chloride: 102 mmol/L (ref 101–111)
GFR calc non Af Amer: 47 mL/min — ABNORMAL LOW (ref >60)
GFR, EST AFRICAN AMERICAN: 54 mL/min — AB (ref >60)
Glucose, Bld: 119 mg/dL — ABNORMAL HIGH (ref 65–99)
Potassium: 3.1 mmol/L — ABNORMAL LOW (ref 3.5–5.1)
SODIUM: 137 mmol/L (ref 135–145)

## 2016-12-11 MED ORDER — POTASSIUM CHLORIDE 10 MEQ/100ML IV SOLN
10.0000 meq | INTRAVENOUS | Status: AC
  Administered 2016-12-11 (×4): 10 meq via INTRAVENOUS
  Filled 2016-12-11 (×4): qty 100

## 2016-12-11 NOTE — Progress Notes (Signed)
4 Days Post-Op   Subjective:  Patient did well overnight. Denies any nausea or vomiting after the removal of his NG tube yesterday. Has been able to ambulate. Pain well-controlled.  Vital signs in last 24 hours: Temp:  [97.5 F (36.4 C)-98.9 F (37.2 C)] 97.5 F (36.4 C) (08/26 0615) Pulse Rate:  [75-83] 75 (08/26 0615) Resp:  [18] 18 (08/26 0615) BP: (147-165)/(83-97) 147/83 (08/26 0615) SpO2:  [95 %-96 %] 96 % (08/26 0615) Last BM Date: 12/05/16  Intake/Output from previous day: 08/25 0701 - 08/26 0700 In: 3150 [I.V.:3000; IV Piggyback:150] Out: 4580 [Urine:4000; Emesis/NG output:300; Drains:280]  GI: Abdomen is soft, appropriately tender to palpation in the midline, nondistended. Midline staples well approximated without evidence of erythema or drainage. JP drains 2 in the right abdomen with a serosanguineous output. End colostomy in the left lower quadrant that is pink, patent. There is liquid within the bag no evidence of stool. Digital examination of the ostomy shows that the lumen is intact and there is palpable stool within 1 fingerbreadths of the skin.  Lab Results:  CBC  Recent Labs  12/09/16 0333 12/10/16 0452  WBC 12.4* 8.7  HGB 10.5* 11.2*  HCT 31.9* 33.3*  PLT 229 216   CMP     Component Value Date/Time   NA 137 12/11/2016 0728   K 3.1 (L) 12/11/2016 0728   CL 102 12/11/2016 0728   CO2 29 12/11/2016 0728   GLUCOSE 119 (H) 12/11/2016 0728   BUN 16 12/11/2016 0728   CREATININE 1.41 (H) 12/11/2016 0728   CREATININE 1.40 (H) 08/12/2015 1632   CALCIUM 8.5 (L) 12/11/2016 0728   PROT 6.3 (L) 12/06/2016 0413   ALBUMIN 2.9 (L) 12/06/2016 0413   AST 34 12/06/2016 0413   ALT 12 (L) 12/06/2016 0413   ALKPHOS 42 12/06/2016 0413   BILITOT 1.1 12/06/2016 0413   GFRNONAA 47 (L) 12/11/2016 0728   GFRAA 54 (L) 12/11/2016 0728   PT/INR No results for input(s): LABPROT, INR in the last 72 hours.  Studies/Results: No results  found.  Assessment/Plan: 76 year old male status post sigmoid colectomy for perforated diverticulitis. Awaiting return of bowel function. The upper JP drain was removed at the bedside by me. The Foley catheter was removed per my order. Instructed the patient to continue to ambulate as tolerated. Given that he has had no nausea or vomiting after removal his NG tube yesterday morning he'll be provided with clear liquids to sip on. Discussed that this is for recreation and that should he have any nausea that he is to push it away. He voiced understanding. Encourage incentive spirometer usage and await further return of bowel function.   Clayburn Pert, MD [facility]  Day [facility] [phone] Night ASC[facility] [phone]  12/11/2016

## 2016-12-12 LAB — CREATININE, SERUM
Creatinine, Ser: 1.49 mg/dL — ABNORMAL HIGH (ref 0.61–1.24)
GFR calc non Af Amer: 44 mL/min — ABNORMAL LOW (ref >60)
GFR, EST AFRICAN AMERICAN: 51 mL/min — AB (ref >60)

## 2016-12-12 NOTE — Care Management Note (Signed)
Case Management Note  Patient Details  Name: Regie Bunner. MRN: [id number] Date of Birth: 12-16-1940  Subjective/Objective:  Admitted with diverticulitis. POD # 5 s/p exploratory lap with colon resection and colostomy. Met with patient at bedside to discuss discharge planning. PT recommending home health PT. Patient agreeable. Lives at home with his wife. She is elderly according to patient and is unable to help him much. He has a walker and cane but typically doesn't use either. No agency preference. Referral to [facility] for [facility] health nursing, PT and HHA.    Currently on a full liquid diet and patient is reluctant to advance diet. Remains on IVF at 125cc/hr and IV abx.             Action/Plan: Following progression  Expected Discharge Date:  12/07/16               Expected Discharge Plan:  [place]  In-House Referral:     Discharge planning Services  CM Consult  Post Acute Care Choice:  Home Health Choice offered to:  Patient  DME Arranged:    DME Agency:     HH Arranged:  PT, Nurse's Aide, Nursing [place] Agency:  [facility]  Status of Service:  In process, will continue to follow  If discussed at Long Length of Stay Meetings, dates discussed:    Additional Comments:  Jolly Mango, RN 12/12/2016, 9:28 AM

## 2016-12-12 NOTE — Progress Notes (Signed)
Physical Therapy Treatment Patient Details Name: Cameron Foster. MRN: [id number] DOB: Aug 29, 1940 Today's Date: 12/12/2016    History of Present Illness Pt is a 76 yo M, admitted to acute care on 8/20 with diverticulitis. Pt underwent exploratory laparotomy with colon resection and colostomy on 8/22; currently p/o day 1. Prior to [place], pt independent with all ADL's, requiring no AD. PMH: alcoholism, arrhythmia, arthritis, asthma, A-fib, colon polyp, depression, diverticulosis, fainting GERD, hx of blood clots, HLD, IBS, prostate cancer, vasovagal syncope, and vertigo.     PT Comments    Pt is making good progress towards goals with improved ambulation distance noted this session. Pt very pleasant to work with and grateful for therapy services. Improved endurance noted this session, however strength continues to be limited secondary to increased 5TSTS score demonstrating decreased power. Will continue to focus on strengthening.   Follow Up Recommendations  Home health PT     Equipment Recommendations  None recommended by PT    Recommendations for Other Services       Precautions / Restrictions Precautions Precautions: Fall Restrictions Weight Bearing Restrictions: No    Mobility  Bed Mobility Overal bed mobility: Needs Assistance Bed Mobility: Supine to Sit     Supine to sit: Min guard     General bed mobility comments: Pt needs slight assist for trunk support while transitioning to sitting at EOB. Once seated at EOB, able to sit with upright posture and good balance.  Transfers Overall transfer level: Needs assistance Equipment used: Rolling walker (2 wheeled) Transfers: Sit to/from Stand Sit to Stand: Supervision         General transfer comment: Safe technique given for transfer. Requires increased time. 5 time sit<>Stand performed in 11' with use of RW.  Ambulation/Gait Ambulation/Gait assistance: Supervision Ambulation Distance (Feet): 100 Feet Assistive  device: Rolling walker (2 wheeled) Gait Pattern/deviations: Step-through pattern     General Gait Details: Pt ambulated multiple laps in room and agreeable to ambulate in hallway with RN later in the day. Safe technique performed with no rest breaks required. Cues for turning secondary to multiple lines/leads. Slow gait pattern noted. Educated on staying close to [employer].   Stairs            Wheelchair Mobility    Modified Rankin (Stroke Patients Only)       Balance                                            Cognition Arousal/Alertness: Awake/alert Behavior During Therapy: WFL for tasks assessed/performed Overall Cognitive Status: Within Functional Limits for tasks assessed                                        Exercises Other Exercises Other Exercises: Seated ther-ex performed including B LAQ x 20 reps and 5 time sit<>Stand.. Cues given for correct technique. Other Exercises: Pt ambulated to bathroom, needs supervision for set up, indep with hygiene. Safe technique performed    General Comments        Pertinent Vitals/Pain Pain Assessment: No/denies pain    Home Living                      Prior Function  PT Goals (current goals can now be found in the care plan section) Acute Rehab PT Goals Patient Stated Goal: to go home  PT Goal Formulation: With patient/family Time For Goal Achievement: 12/22/16 Potential to Achieve Goals: Good Progress towards PT goals: Progressing toward goals    Frequency    Min 2X/week      PT Plan Current plan remains appropriate    Co-evaluation              AM-PAC PT "6 Clicks" Daily Activity  Outcome Measure  Difficulty turning over in bed (including adjusting bedclothes, sheets and blankets)?: None Difficulty moving from lying on back to sitting on the side of the bed? : Unable Difficulty sitting down on and standing up from a chair with arms (e.g.,  wheelchair, bedside commode, etc,.)?: A Little Help needed moving to and from a bed to chair (including a wheelchair)?: None Help needed walking in hospital room?: None Help needed climbing 3-5 steps with a railing? : A Little 6 Click Score: 19    End of Session Equipment Utilized During Treatment: Gait belt Activity Tolerance: Patient tolerated treatment well Patient left: in chair;with call bell/phone within reach;with chair alarm set Nurse Communication: Mobility status PT Visit Diagnosis: Unsteadiness on feet (R26.81);Muscle weakness (generalized) (M62.81);Other abnormalities of gait and mobility (R26.89);Pain     Time: 1062-6948 PT Time Calculation (min) (ACUTE ONLY): 24 min  Charges:  $Gait Training: 8-22 mins $Therapeutic Activity: 8-22 mins                    G Codes:       Greggory Stallion, PT, DPT [phone]    Cameron Foster 12/12/2016, 9:18 AM

## 2016-12-12 NOTE — Progress Notes (Signed)
5 Days Post-Op  Subjective: Patient is afraid to eat but feels quite well today. His ostomy is functional at this point. He has no nausea or vomiting  Objective: Vital signs in last 24 hours: Temp:  [98 F (36.7 C)-98.4 F (36.9 C)] 98.4 F (36.9 C) (08/27 0432) Pulse Rate:  [70-81] 81 (08/27 0432) Resp:  [20] 20 (08/27 0432) BP: (133-145)/(78-90) 133/78 (08/27 0432) SpO2:  [94 %-97 %] 94 % (08/27 0432) Last BM Date: 12/05/16  Intake/Output from previous day: 08/26 0701 - 08/27 0700 In: 2270.8 [I.V.:2170.8; IV Piggyback:100] Out: 1610 [Urine:1450; Drains:90; Stool:70] Intake/Output this shift: No intake/output data recorded.  Physical exam:  Functional colostomy no sign of ischemia abdomen is soft nondistended wounds are clean drain is in place.  Lab Results: CBC   Recent Labs  12/10/16 0452  WBC 8.7  HGB 11.2*  HCT 33.3*  PLT 216   BMET  Recent Labs  12/10/16 0452 12/11/16 0728 12/12/16 0500  NA 137 137  --   K 3.3* 3.1*  --   CL 104 102  --   CO2 26 29  --   GLUCOSE 99 119*  --   BUN 24* 16  --   CREATININE 1.59* 1.41* 1.49*  CALCIUM 8.8* 8.5*  --    PT/INR No results for input(s): LABPROT, INR in the last 72 hours. ABG No results for input(s): PHART, HCO3 in the last 72 hours.  Invalid input(s): PCO2, PO2  Studies/Results: No results found.  Anti-infectives: Anti-infectives    Start     Dose/Rate Route Frequency Ordered Stop   12/05/16 1600  piperacillin-tazobactam (ZOSYN) IVPB 3.375 g     3.375 g 100 mL/hr over 30 Minutes Intravenous  Once 12/05/16 1538 12/05/16 1559   12/05/16 0000  piperacillin-tazobactam (ZOSYN) IVPB 3.375 g     3.375 g 12.5 mL/hr over 240 Minutes Intravenous Every 8 hours 12/05/16 1721        Assessment/Plan: s/p Procedure(s): EXPLORATORY LAPAROTOMY COLON RESECTION SIGMOID COLOSTOMY DRAINAGE  OF INTRA ABDOMINAL ABSCESS   Patient is doing quite well will advance diet to full liquid diet at this point but the  patient is reluctant to be aggressive with his diet.  I discussed placement with him versus home health and I discussed that with social services who will assist in disposition.  Florene Glen, MD, FACS  12/12/2016

## 2016-12-12 NOTE — Consult Note (Signed)
[place] Nurse ostomy follow up Stoma type/location: LLQ Colostomy Stomal assessment/size: 1 1/2" pink and moist, less edematous today Peristomal assessment: intact.  Midline surgical incision Treatment options for stomal/peristomal skin: Barrier ring Output soft brown stool Ostomy pouching: 2pc. 2 1/4" pouch with barrier ring Education provided: Wife at bedside. Patient walked to bathroom and emptied pouch in toilet with assistance. Demonstrated cleaning roll closure.   Pouch is removed and cleansed with soap and water.  Stoma measured and wife able to cut barrier.  Barrier ring and pouch applied.  Patient and wife demonstrated roll closure.  Reinforced to empty when 1/3 full.  Patient having a lot of output today.  Stated that pouch chagnes are usually twice weekly but emptying several times daily when 1/3 full. Discussed function of charcoal filter for flatus and odor.  Discussed showering and activity as tolerated.  Will have [place] For reinforcement of care. Anticipate discharge tomorrow, per wife.  Enrolled patient in [place] Start Discharge program: Yes [place] team will follow.  Domenic Moras RN BSN [place] Pager [phone]

## 2016-12-13 LAB — CBC
HCT: 32.3 % — ABNORMAL LOW (ref 40.0–52.0)
Hemoglobin: 10.7 g/dL — ABNORMAL LOW (ref 13.0–18.0)
MCH: 27.8 pg (ref 26.0–34.0)
MCHC: 33.2 g/dL (ref 32.0–36.0)
MCV: 83.6 fL (ref 80.0–100.0)
PLATELETS: 314 10*3/uL (ref 150–440)
RBC: 3.87 MIL/uL — ABNORMAL LOW (ref 4.40–5.90)
RDW: 15.7 % — AB (ref 11.5–14.5)
WBC: 8.9 10*3/uL (ref 3.8–10.6)

## 2016-12-13 NOTE — Progress Notes (Signed)
6 Days Post-Op  Subjective: Patient still has a poor appetite but is tolerating current diet. He is having good function from his ostomy. He wants home health when he is discharged.  Objective: Vital signs in last 24 hours: Temp:  [98 F (36.7 C)-99.3 F (37.4 C)] 98 F (36.7 C) (08/28 0459) Pulse Rate:  [78-79] 79 (08/28 0459) Resp:  [18] 18 (08/28 0459) BP: (145-157)/(79-82) 145/79 (08/28 0459) SpO2:  [95 %-97 %] 97 % (08/28 0459) Last BM Date: 12/05/16  Intake/Output from previous day: 08/27 0701 - 08/28 0700 In: 1637.3 [P.O.:480; I.V.:1027.3; IV Piggyback:50] Out: 1955 [Urine:1725; Drains:130; Stool:100] Intake/Output this shift: Total I/O In: -  Out: 375 [Urine:375]  Physical exam:  Wound is clean drain is functional wound without purulence. Abdomen is otherwise soft and nontender ostomy is functional.  Lab Results: CBC   Recent Labs  12/13/16 0413  WBC 8.9  HGB 10.7*  HCT 32.3*  PLT 314   BMET  Recent Labs  12/11/16 0728 12/12/16 0500  NA 137  --   K 3.1*  --   CL 102  --   CO2 29  --   GLUCOSE 119*  --   BUN 16  --   CREATININE 1.41* 1.49*  CALCIUM 8.5*  --    PT/INR No results for input(s): LABPROT, INR in the last 72 hours. ABG No results for input(s): PHART, HCO3 in the last 72 hours.  Invalid input(s): PCO2, PO2  Studies/Results: No results found.  Anti-infectives: Anti-infectives    Start     Dose/Rate Route Frequency Ordered Stop   12/05/16 1600  piperacillin-tazobactam (ZOSYN) IVPB 3.375 g     3.375 g 100 mL/hr over 30 Minutes Intravenous  Once 12/05/16 1538 12/05/16 1559   12/05/16 0000  piperacillin-tazobactam (ZOSYN) IVPB 3.375 g     3.375 g 12.5 mL/hr over 240 Minutes Intravenous Every 8 hours 12/05/16 1721        Assessment/Plan: s/p Procedure(s): EXPLORATORY LAPAROTOMY COLON RESECTION SIGMOID COLOSTOMY DRAINAGE  OF INTRA ABDOMINAL ABSCESS   Patient is doing quite well will advance diet and hopefully be able to  discharge in the next day or 2.  Florene Glen, MD, [facility]  12/13/2016

## 2016-12-14 MED ORDER — AMOXICILLIN-POT CLAVULANATE 875-125 MG PO TABS: 1.0000 | ORAL_TABLET | Freq: Two times a day (BID) | ORAL | 1 refills | Status: DC

## 2016-12-14 MED ORDER — OXYCODONE-ACETAMINOPHEN 5-325 MG PO TABS
1.0000 | ORAL_TABLET | ORAL | 0 refills | Status: DC | PRN
Start: 2016-12-14 — End: 2016-12-23

## 2016-12-14 NOTE — Discharge Summary (Signed)
Physician Discharge Summary  Patient ID: Cameron Foster. MRN: [id number] DOB/AGE: 1940-10-27 76 y.o.  Admit date: 12/05/2016 Discharge date: 12/14/2016   Discharge Diagnoses:  Active Problems:   Diverticulitis   Procedures:Hartman's procedure with end colostomy  Hospital Course: This patient admitted the hospital. He had signs of a pelvic abscess and perforated diverticulitis. He was treated with IV antibiotics and taken the operating room where drainage of the pelvic abscess was performed and an end colostomy was performed the drain was removed today and he is tolerating a regular diet with a fair appetite. Home health is been consult in for assistance with his new ostomy. He will follow-up with Dr. Adonis Huguenin next week for staple removal. It is instructed to take oral antibiotics and oral analgesics as well as his other home medications. He may shower. Consults: None  Disposition: 01-Home or Self Care   Allergies as of 12/14/2016      Reactions   Formaldehyde Rash, Other (See Comments)   Tape Rash      Medication List    TAKE these medications   amLODipine 5 MG tablet Commonly known as:  NORVASC Take 1 tablet (5 mg total) by mouth daily. What changed:  when to take this   amoxicillin-clavulanate 875-125 MG tablet Commonly known as:  AUGMENTIN Take 1 tablet by mouth 2 (two) times daily.   gabapentin 400 MG capsule Commonly known as:  NEURONTIN Take 1 capsule by mouth 2 (two) times daily.   HYDROcodone-acetaminophen 7.5-325 MG tablet Commonly known as:  NORCO Take 1 tablet by mouth every 6 (six) hours as needed for moderate pain.   hydrocortisone 2.5 % cream APPLY  CREAM TO AFFECTED AREA TWICE DAILY AS NEEDED FOR  RASH   lisinopril 10 MG tablet Commonly known as:  PRINIVIL,ZESTRIL Take 1 tablet (10 mg total) by mouth daily.   meloxicam 15 MG tablet Commonly known as:  MOBIC Take 1 tablet (15 mg total) by mouth daily.   metoprolol succinate 25 MG 24 hr  tablet Commonly known as:  TOPROL-XL Take 1 tablet (25 mg total) by mouth daily.   oxyCODONE-acetaminophen 5-325 MG tablet Commonly known as:  ROXICET Take 1 tablet by mouth every 4 (four) hours as needed for moderate pain.   pantoprazole 40 MG tablet Commonly known as:  PROTONIX Take 40 mg by mouth daily.   polyethylene glycol powder powder Commonly known as:  GLYCOLAX/MIRALAX Take 17 g by mouth 2 (two) times daily as needed. What changed:  when to take this   sertraline 50 MG tablet Commonly known as:  ZOLOFT Take 1 tablet (50 mg total) by mouth daily. What changed:  when to take this   sertraline 50 MG tablet Commonly known as:  ZOLOFT TAKE ONE TABLET BY MOUTH ONCE DAILY What changed:  Another medication with the same name was changed. Make sure you understand how and when to take each.   sucralfate 1 g tablet Commonly known as:  CARAFATE Take 1 g by mouth 4 (four) times daily -  with meals and at bedtime.            Discharge Care Instructions        Start     Ordered   12/14/16 0000  amoxicillin-clavulanate (AUGMENTIN) 875-125 MG tablet  2 times daily     12/14/16 0856   12/14/16 0000  oxyCODONE-acetaminophen (ROXICET) 5-325 MG tablet  Every 4 hours PRN     12/14/16 0856     Follow-up Information  Florene Glen, MD. Daphane Shepherd on 12/23/2016.   Specialty:  Surgery Why:   Friday at 10:00 a.m.   Contact information: [place] [address] [phone]           Florene Glen, MD, FACS

## 2016-12-14 NOTE — Care Management (Signed)
Patient to discharge home today.  Orders have been placed for home health.  [name] with [facility] notified, and he has met with patient at bedside.  Patient transitioned to oral antibiotics.  RNCM signing off.

## 2016-12-14 NOTE — Progress Notes (Signed)
Patient to discharge home day. IV removed and catherer intact. Discussed discharge instructions with patient and wife. Went over ostomy care. Rx hard copy given to patient. Pt verbalized understanding. VVS stable, no distressed noted. Pt discharged via wheelchair.

## 2016-12-14 NOTE — Care Management Important Message (Signed)
Important Message  Patient Details  Name: Cameron Foster. MRN: [id number] Date of Birth: 1940/11/08   Medicare Important Message Given:  Yes    Beverly Sessions, RN 12/14/2016, 2:35 PM

## 2016-12-14 NOTE — Discharge Instructions (Signed)
Follow-up with Dr. Adonis Huguenin in 10 days May shower Follow directions of home health for ostomy care etc. Resume home medications Take oral analgesics and oral antibiotics as directed. Follow-up with [facility] or the emergency room should fevers or pain returned.

## 2016-12-14 NOTE — Progress Notes (Signed)
7 Days Post-Op  Subjective: Patient feels better today but his appetite is still poor. He wants to go home with home health. He is tolerating his diet but his appetite is poor  Objective: Vital signs in last 24 hours: Temp:  [98.2 F (36.8 C)-98.6 F (37 C)] 98.6 F (37 C) (08/29 0403) Pulse Rate:  [72-88] 88 (08/29 0403) Resp:  [12-20] 20 (08/29 0403) BP: (135-174)/(67-93) 155/93 (08/29 0403) SpO2:  [93 %-96 %] 93 % (08/29 0403) Last BM Date: 12/05/16  Intake/Output from previous day: 08/28 0701 - 08/29 0700 In: 5625 [P.O.:240; I.V.:5038; IV Piggyback:307] Out: 4230 [Urine:4200; Drains:30] Intake/Output this shift: No intake/output data recorded.  Physical exam:  Abdomen is soft nontender wounds clean no erythema no drainage JP drain is in place which is serosanguineous only an ostomy is functional  Lab Results: CBC   Recent Labs  12/13/16 0413  WBC 8.9  HGB 10.7*  HCT 32.3*  PLT 314   BMET  Recent Labs  12/12/16 0500  CREATININE 1.49*   PT/INR No results for input(s): LABPROT, INR in the last 72 hours. ABG No results for input(s): PHART, HCO3 in the last 72 hours.  Invalid input(s): PCO2, PO2  Studies/Results: No results found.  Anti-infectives: Anti-infectives    Start     Dose/Rate Route Frequency Ordered Stop   12/05/16 1600  piperacillin-tazobactam (ZOSYN) IVPB 3.375 g     3.375 g 100 mL/hr over 30 Minutes Intravenous  Once 12/05/16 1538 12/05/16 1559   12/05/16 0000  piperacillin-tazobactam (ZOSYN) IVPB 3.375 g     3.375 g 12.5 mL/hr over 240 Minutes Intravenous Every 8 hours 12/05/16 1721        Assessment/Plan: s/p Procedure(s): EXPLORATORY LAPAROTOMY COLON RESECTION SIGMOID COLOSTOMY DRAINAGE  OF INTRA ABDOMINAL ABSCESS   This patient doing well after Hartman's procedure. I believe that his JP drain can be removed later today and likely be discharged with home health. I believe at home health is being arranged and he will go home on  oral antibiotics. I would start making arrangements for discharge later today.  Florene Glen, MD, FACS  12/14/2016

## 2016-12-14 NOTE — Progress Notes (Signed)
Physical Therapy Treatment Patient Details Name: Cameron Foster. MRN: [id number] DOB: 07-27-1940 Today's Date: 12/14/2016    History of Present Illness Pt is a 76 yo M, admitted to acute care on 8/20 with diverticulitis. Pt underwent exploratory laparotomy with colon resection and colostomy on 8/22; currently p/o day 1. Prior to [place], pt independent with all ADL's, requiring no AD. PMH: alcoholism, arrhythmia, arthritis, asthma, A-fib, colon polyp, depression, diverticulosis, fainting GERD, hx of blood clots, HLD, IBS, prostate cancer, vasovagal syncope, and vertigo.     PT Comments    Pt is making good progress towards goals with increased ambulation distance noted this session. Pt with increased speed and motivated to go home this date. Will continue to progress as able.   Follow Up Recommendations  Home health PT     Equipment Recommendations  None recommended by PT    Recommendations for Other Services       Precautions / Restrictions Precautions Precautions: Fall Restrictions Weight Bearing Restrictions: No    Mobility  Bed Mobility Overal bed mobility: Needs Assistance Bed Mobility: Supine to Sit     Supine to sit: Min guard     General bed mobility comments: slight HHA given for transition to side of bed.  Transfers Overall transfer level: Needs assistance Equipment used: Rolling walker (2 wheeled) Transfers: Sit to/from Stand Sit to Stand: Supervision         General transfer comment: safe technique performed with RW. UPright posture performed.  Ambulation/Gait Ambulation/Gait assistance: Supervision Ambulation Distance (Feet): 350 Feet Assistive device: Rolling walker (2 wheeled) Gait Pattern/deviations: Step-through pattern     General Gait Details: Pt ambulated in hallway twice around RN station. Good speed with reciprocal gait pattern performed.  Occasional cues to stay closer to RW.   Stairs            Wheelchair Mobility     Modified Rankin (Stroke Patients Only)       Balance                                            Cognition Arousal/Alertness: Awake/alert Behavior During Therapy: WFL for tasks assessed/performed Overall Cognitive Status: Within Functional Limits for tasks assessed                                        Exercises Other Exercises Other Exercises: Pt ambulated to bathroom, needs supervision for set up, indep with hygiene. Safe technique performed    General Comments        Pertinent Vitals/Pain Pain Assessment: No/denies pain    Home Living                      Prior Function            PT Goals (current goals can now be found in the care plan section) Acute Rehab PT Goals Patient Stated Goal: to go home  PT Goal Formulation: With patient/family Time For Goal Achievement: 12/22/16 Potential to Achieve Goals: Good Progress towards PT goals: Progressing toward goals    Frequency    Min 2X/week      PT Plan Current plan remains appropriate    Co-evaluation              AM-PAC PT "  6 Clicks" Daily Activity  Outcome Measure  Difficulty turning over in bed (including adjusting bedclothes, sheets and blankets)?: None Difficulty moving from lying on back to sitting on the side of the bed? : Unable Difficulty sitting down on and standing up from a chair with arms (e.g., wheelchair, bedside commode, etc,.)?: A Little Help needed moving to and from a bed to chair (including a wheelchair)?: None Help needed walking in hospital room?: None Help needed climbing 3-5 steps with a railing? : A Little 6 Click Score: 19    End of Session Equipment Utilized During Treatment: Gait belt Activity Tolerance: Patient tolerated treatment well Patient left: in chair;with call bell/phone within reach;with chair alarm set Nurse Communication: Mobility status PT Visit Diagnosis: Unsteadiness on feet (R26.81);Muscle weakness  (generalized) (M62.81);Other abnormalities of gait and mobility (R26.89);Pain     Time: 1037-1100 PT Time Calculation (min) (ACUTE ONLY): 23 min  Charges:  $Gait Training: 23-37 mins                    G Codes:       Greggory Stallion, PT, DPT [phone]    Lanya Bucks 12/14/2016, 12:23 PM

## 2016-12-15 ENCOUNTER — Telehealth: Payer: Self-pay

## 2016-12-15 DIAGNOSIS — Z8546 Personal history of malignant neoplasm of prostate: Secondary | ICD-10-CM | POA: Diagnosis not present

## 2016-12-15 DIAGNOSIS — K589 Irritable bowel syndrome without diarrhea: Secondary | ICD-10-CM | POA: Diagnosis not present

## 2016-12-15 DIAGNOSIS — Z433 Encounter for attention to colostomy: Secondary | ICD-10-CM | POA: Diagnosis not present

## 2016-12-15 DIAGNOSIS — K5720 Diverticulitis of large intestine with perforation and abscess without bleeding: Secondary | ICD-10-CM | POA: Diagnosis not present

## 2016-12-15 DIAGNOSIS — Z96651 Presence of right artificial knee joint: Secondary | ICD-10-CM | POA: Diagnosis not present

## 2016-12-15 DIAGNOSIS — K219 Gastro-esophageal reflux disease without esophagitis: Secondary | ICD-10-CM | POA: Diagnosis not present

## 2016-12-15 DIAGNOSIS — I10 Essential (primary) hypertension: Secondary | ICD-10-CM | POA: Diagnosis not present

## 2016-12-15 DIAGNOSIS — Z48815 Encounter for surgical aftercare following surgery on the digestive system: Secondary | ICD-10-CM | POA: Diagnosis not present

## 2016-12-15 DIAGNOSIS — F329 Major depressive disorder, single episode, unspecified: Secondary | ICD-10-CM | POA: Diagnosis not present

## 2016-12-15 DIAGNOSIS — Z87891 Personal history of nicotine dependence: Secondary | ICD-10-CM | POA: Diagnosis not present

## 2016-12-15 NOTE — Telephone Encounter (Signed)
Post-op call made to patient at this time. Spoke with Patient. Post-op interview questions below.  1. How are you feeling? Well  2. Is your pain controlled? Yes  3. What are you doing for the pain? Using pain medication PRN  4. Are you having any Nausea or Vomiting? None  5. Are you having any Fever or Chills? None  6. Are you having any Constipation or Diarrhea? None  7. Is there any Swelling or Bruising you are concerned about? None  8. Do you have any questions or concerns at this time? No   Discussion: Reviewed post-op appointment with patient. This was moved per Dr Adonis Huguenin. Patient was encouraged to call with any further questions or concerns.

## 2016-12-16 DIAGNOSIS — F329 Major depressive disorder, single episode, unspecified: Secondary | ICD-10-CM | POA: Diagnosis not present

## 2016-12-16 DIAGNOSIS — I10 Essential (primary) hypertension: Secondary | ICD-10-CM | POA: Diagnosis not present

## 2016-12-16 DIAGNOSIS — K5720 Diverticulitis of large intestine with perforation and abscess without bleeding: Secondary | ICD-10-CM | POA: Diagnosis not present

## 2016-12-16 DIAGNOSIS — Z433 Encounter for attention to colostomy: Secondary | ICD-10-CM | POA: Diagnosis not present

## 2016-12-16 DIAGNOSIS — Z48815 Encounter for surgical aftercare following surgery on the digestive system: Secondary | ICD-10-CM | POA: Diagnosis not present

## 2016-12-16 DIAGNOSIS — K219 Gastro-esophageal reflux disease without esophagitis: Secondary | ICD-10-CM | POA: Diagnosis not present

## 2016-12-19 DIAGNOSIS — K5720 Diverticulitis of large intestine with perforation and abscess without bleeding: Secondary | ICD-10-CM | POA: Diagnosis not present

## 2016-12-19 DIAGNOSIS — I10 Essential (primary) hypertension: Secondary | ICD-10-CM | POA: Diagnosis not present

## 2016-12-19 DIAGNOSIS — F329 Major depressive disorder, single episode, unspecified: Secondary | ICD-10-CM | POA: Diagnosis not present

## 2016-12-19 DIAGNOSIS — Z433 Encounter for attention to colostomy: Secondary | ICD-10-CM | POA: Diagnosis not present

## 2016-12-19 DIAGNOSIS — Z48815 Encounter for surgical aftercare following surgery on the digestive system: Secondary | ICD-10-CM | POA: Diagnosis not present

## 2016-12-19 DIAGNOSIS — K219 Gastro-esophageal reflux disease without esophagitis: Secondary | ICD-10-CM | POA: Diagnosis not present

## 2016-12-20 ENCOUNTER — Telehealth: Payer: Self-pay

## 2016-12-20 ENCOUNTER — Telehealth: Payer: Self-pay | Admitting: Family Medicine

## 2016-12-20 ENCOUNTER — Other Ambulatory Visit: Payer: Self-pay | Admitting: *Deleted

## 2016-12-20 DIAGNOSIS — K5720 Diverticulitis of large intestine with perforation and abscess without bleeding: Secondary | ICD-10-CM | POA: Diagnosis not present

## 2016-12-20 DIAGNOSIS — R600 Localized edema: Secondary | ICD-10-CM | POA: Diagnosis not present

## 2016-12-20 DIAGNOSIS — I10 Essential (primary) hypertension: Secondary | ICD-10-CM | POA: Diagnosis not present

## 2016-12-20 DIAGNOSIS — Z433 Encounter for attention to colostomy: Secondary | ICD-10-CM | POA: Diagnosis not present

## 2016-12-20 DIAGNOSIS — Z48815 Encounter for surgical aftercare following surgery on the digestive system: Secondary | ICD-10-CM | POA: Diagnosis not present

## 2016-12-20 DIAGNOSIS — F329 Major depressive disorder, single episode, unspecified: Secondary | ICD-10-CM | POA: Diagnosis not present

## 2016-12-20 DIAGNOSIS — K219 Gastro-esophageal reflux disease without esophagitis: Secondary | ICD-10-CM | POA: Diagnosis not present

## 2016-12-20 NOTE — Patient Outreach (Signed)
[facility]) Care Management  12/20/2016  Cameron Foster 1940/12/20 [id number]  Referral via EMMI-General Discharge-Red Alert Day# 1, 12/16/2016 Reason: Got discharge papers?-No:  Telephone call to patient who was advised of reason for call. HIPPA verification received from patient.  Patient currently having visit from home health nurse; will need to call back later.  Plan: Will follow up.  Sherrin Daisy, RN BSN [place] Management Coordinator [facility] Care Management  [phone]

## 2016-12-20 NOTE — Telephone Encounter (Signed)
Advised patient to go to ED to be evaluated due to [facility] being out of the office. Patient agreed to go to ED.

## 2016-12-20 NOTE — Telephone Encounter (Signed)
Patient had surgery on 12/07/16 Dr.Woodham. Laparoscopic Laparotomy.  He stated his ankle /leg is swelling over the past few days, however its larger today.  Denies pain, or redness.  Spoke with Dr.Piscoya and he advises patient to be seen in emergency room.

## 2016-12-20 NOTE — Telephone Encounter (Signed)
Pt called and stated that he had emergency surgery on 8/22 for a laparotomy. Pt is c/o of his rt leg swelling. Pt called his surgeon and they advised to him to call the office and see if we could see him. Sent pt's call to [facility] triage. Please advise, thank you!  Call pt @ [phone]

## 2016-12-21 ENCOUNTER — Other Ambulatory Visit: Payer: Self-pay | Admitting: *Deleted

## 2016-12-21 ENCOUNTER — Other Ambulatory Visit: Payer: Self-pay | Admitting: Family Medicine

## 2016-12-21 DIAGNOSIS — K5720 Diverticulitis of large intestine with perforation and abscess without bleeding: Secondary | ICD-10-CM | POA: Diagnosis not present

## 2016-12-21 DIAGNOSIS — F329 Major depressive disorder, single episode, unspecified: Secondary | ICD-10-CM | POA: Diagnosis not present

## 2016-12-21 DIAGNOSIS — Z48815 Encounter for surgical aftercare following surgery on the digestive system: Secondary | ICD-10-CM | POA: Diagnosis not present

## 2016-12-21 DIAGNOSIS — K219 Gastro-esophageal reflux disease without esophagitis: Secondary | ICD-10-CM | POA: Diagnosis not present

## 2016-12-21 DIAGNOSIS — Z433 Encounter for attention to colostomy: Secondary | ICD-10-CM | POA: Diagnosis not present

## 2016-12-21 DIAGNOSIS — I10 Essential (primary) hypertension: Secondary | ICD-10-CM | POA: Diagnosis not present

## 2016-12-21 NOTE — Patient Outreach (Signed)
[facility]) Care Management  12/21/2016  Cameron Foster. [birth year] [id number]  Referral via EMMI-General Discharge -Red Alert Day #1, 12/16/2017 & Day #4,  12/19/2016;  Reasons:Got discharge papers? No Sad/hopeless/anxious/empty? Yes.  Telephone call to patient who was advised of reason for call & [facility] care management services.  HIPPA verification received.   Patient voices that he received automated calls but did not answer any of the questions that were asked. States he was busy with home health services both times & was unable to participate.   States he is currently receiving home health services with RN & physical therapist. States home health RN is teaching him colostomy care. States he went to [facility] clinic yesterday because he had swelling in lower leg. States he was advised that he did not have a blood clot at visit.  Voices that he has a history of problem with his leg & knows when to consult MD.  Voices he has appointment scheduled with primary care provider 9/10 & appointment with surgeon 01/03/2017.    Patient voices no further concerns. EMMI call completed.   Plan: Send to care management assistant to close out.  Sherrin Daisy, RN BSN [place] Management Coordinator [facility] Care Management  [phone]

## 2016-12-22 DIAGNOSIS — K219 Gastro-esophageal reflux disease without esophagitis: Secondary | ICD-10-CM | POA: Diagnosis not present

## 2016-12-22 DIAGNOSIS — Z433 Encounter for attention to colostomy: Secondary | ICD-10-CM | POA: Diagnosis not present

## 2016-12-22 DIAGNOSIS — Z48815 Encounter for surgical aftercare following surgery on the digestive system: Secondary | ICD-10-CM | POA: Diagnosis not present

## 2016-12-22 DIAGNOSIS — K5720 Diverticulitis of large intestine with perforation and abscess without bleeding: Secondary | ICD-10-CM | POA: Diagnosis not present

## 2016-12-22 DIAGNOSIS — I10 Essential (primary) hypertension: Secondary | ICD-10-CM | POA: Diagnosis not present

## 2016-12-22 DIAGNOSIS — F329 Major depressive disorder, single episode, unspecified: Secondary | ICD-10-CM | POA: Diagnosis not present

## 2016-12-23 ENCOUNTER — Encounter: Payer: Medicare Other | Admitting: Surgery

## 2016-12-23 ENCOUNTER — Telehealth: Payer: Self-pay | Admitting: Surgery

## 2016-12-23 ENCOUNTER — Emergency Department: Payer: Medicare Other

## 2016-12-23 ENCOUNTER — Telehealth: Payer: Self-pay

## 2016-12-23 ENCOUNTER — Other Ambulatory Visit: Payer: Self-pay

## 2016-12-23 ENCOUNTER — Encounter: Payer: Self-pay | Admitting: Emergency Medicine

## 2016-12-23 ENCOUNTER — Emergency Department
Admission: EM | Admit: 2016-12-23 | Discharge: 2016-12-23 | Disposition: A | Discharge status: Home / Self Care | Payer: Medicare Other | Attending: Emergency Medicine | Admitting: Emergency Medicine

## 2016-12-23 DIAGNOSIS — Z79899 Other long term (current) drug therapy: Secondary | ICD-10-CM | POA: Diagnosis not present

## 2016-12-23 DIAGNOSIS — R1032 Left lower quadrant pain: Secondary | ICD-10-CM

## 2016-12-23 DIAGNOSIS — F17220 Nicotine dependence, chewing tobacco, uncomplicated: Secondary | ICD-10-CM | POA: Diagnosis not present

## 2016-12-23 DIAGNOSIS — R109 Unspecified abdominal pain: Secondary | ICD-10-CM | POA: Diagnosis not present

## 2016-12-23 DIAGNOSIS — K5720 Diverticulitis of large intestine with perforation and abscess without bleeding: Secondary | ICD-10-CM | POA: Diagnosis not present

## 2016-12-23 DIAGNOSIS — G8929 Other chronic pain: Secondary | ICD-10-CM | POA: Insufficient documentation

## 2016-12-23 DIAGNOSIS — K5792 Diverticulitis of intestine, part unspecified, without perforation or abscess without bleeding: Secondary | ICD-10-CM

## 2016-12-23 DIAGNOSIS — Z48815 Encounter for surgical aftercare following surgery on the digestive system: Secondary | ICD-10-CM | POA: Diagnosis not present

## 2016-12-23 DIAGNOSIS — Z433 Encounter for attention to colostomy: Secondary | ICD-10-CM | POA: Diagnosis not present

## 2016-12-23 DIAGNOSIS — K5732 Diverticulitis of large intestine without perforation or abscess without bleeding: Secondary | ICD-10-CM | POA: Insufficient documentation

## 2016-12-23 DIAGNOSIS — F329 Major depressive disorder, single episode, unspecified: Secondary | ICD-10-CM | POA: Diagnosis not present

## 2016-12-23 DIAGNOSIS — J45909 Unspecified asthma, uncomplicated: Secondary | ICD-10-CM | POA: Insufficient documentation

## 2016-12-23 DIAGNOSIS — I10 Essential (primary) hypertension: Secondary | ICD-10-CM | POA: Diagnosis not present

## 2016-12-23 DIAGNOSIS — K219 Gastro-esophageal reflux disease without esophagitis: Secondary | ICD-10-CM | POA: Diagnosis not present

## 2016-12-23 LAB — COMPREHENSIVE METABOLIC PANEL
ALBUMIN: 3.3 g/dL — AB (ref 3.5–5.0)
ALK PHOS: 69 U/L (ref 38–126)
ALT: 11 U/L — AB (ref 17–63)
AST: 17 U/L (ref 15–41)
Anion gap: 7 (ref 5–15)
BUN: 15 mg/dL (ref 6–20)
CALCIUM: 9.2 mg/dL (ref 8.9–10.3)
CO2: 27 mmol/L (ref 22–32)
CREATININE: 1.26 mg/dL — AB (ref 0.61–1.24)
Chloride: 102 mmol/L (ref 101–111)
GFR calc non Af Amer: 54 mL/min — ABNORMAL LOW (ref >60)
GLUCOSE: 96 mg/dL (ref 65–99)
Potassium: 4.3 mmol/L (ref 3.5–5.1)
SODIUM: 136 mmol/L (ref 135–145)
Total Bilirubin: 0.5 mg/dL (ref 0.3–1.2)
Total Protein: 7 g/dL (ref 6.5–8.1)

## 2016-12-23 LAB — URINALYSIS, COMPLETE (UACMP) WITH MICROSCOPIC
Bacteria, UA: NONE SEEN
Bilirubin Urine: NEGATIVE
Glucose, UA: NEGATIVE mg/dL
HGB URINE DIPSTICK: NEGATIVE
KETONES UR: NEGATIVE mg/dL
LEUKOCYTES UA: NEGATIVE
Nitrite: NEGATIVE
PROTEIN: NEGATIVE mg/dL
SQUAMOUS EPITHELIAL / LPF: NONE SEEN
Specific Gravity, Urine: 1.006 (ref 1.005–1.030)
pH: 6 (ref 5.0–8.0)

## 2016-12-23 LAB — CBC
HCT: 32 % — ABNORMAL LOW (ref 40.0–52.0)
Hemoglobin: 10.7 g/dL — ABNORMAL LOW (ref 13.0–18.0)
MCH: 28 pg (ref 26.0–34.0)
MCHC: 33.3 g/dL (ref 32.0–36.0)
MCV: 84 fL (ref 80.0–100.0)
Platelets: 445 10*3/uL — ABNORMAL HIGH (ref 150–440)
RBC: 3.81 MIL/uL — ABNORMAL LOW (ref 4.40–5.90)
RDW: 15.5 % — AB (ref 11.5–14.5)
WBC: 5.9 10*3/uL (ref 3.8–10.6)

## 2016-12-23 LAB — LIPASE, BLOOD: Lipase: 45 U/L (ref 11–51)

## 2016-12-23 IMAGING — CT CT ABD-PELV W/ CM
2 of 5 series · 14 of 46 positions shown, 16 images · IV contrast (APPLIED)
Comparison: Prior CT from [DATE].

CLINICAL DATA: Initial evaluation for acute left upper quadrant
abdominal pain. Recent colonoscopy.

EXAM:
CT ABDOMEN AND PELVIS WITH CONTRAST
TECHNIQUE: Multidetector CT imaging of the abdomen and pelvis was performed
using the standard protocol following bolus administration of
intravenous contrast.
CONTRAST:  100mL [61] IOPAMIDOL ([61]) INJECTION 61%

[Series 2: routine abd/pel with · axial · 0.96mm/px · z∈[-615,-165]mm · 11 of 102 slices shown, 13 images]
[im 6/102  soft-tissue]
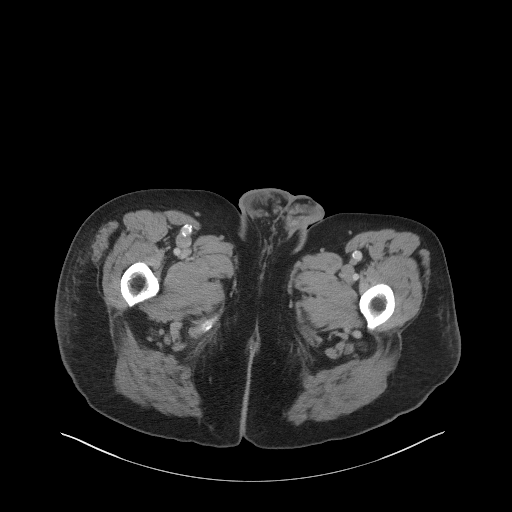
[im 6/102  bone]
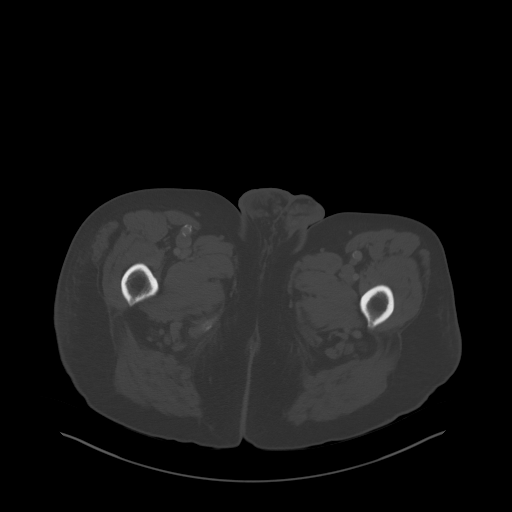
[im 16/102  soft-tissue]
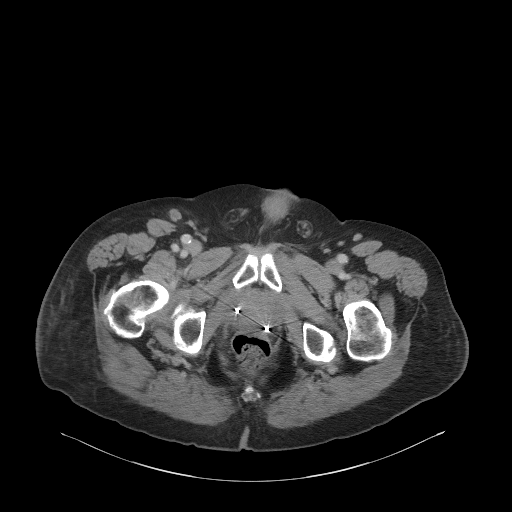
[im 26/102  soft-tissue]
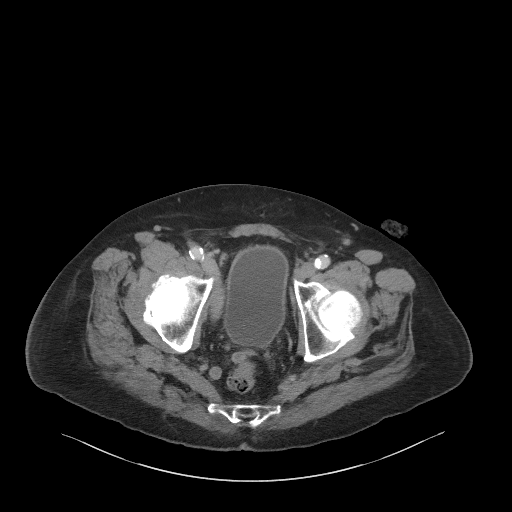
[im 36/102  soft-tissue]
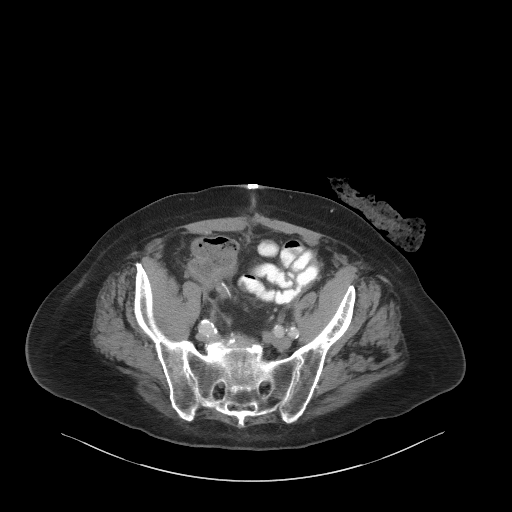
[im 41/102  soft-tissue]
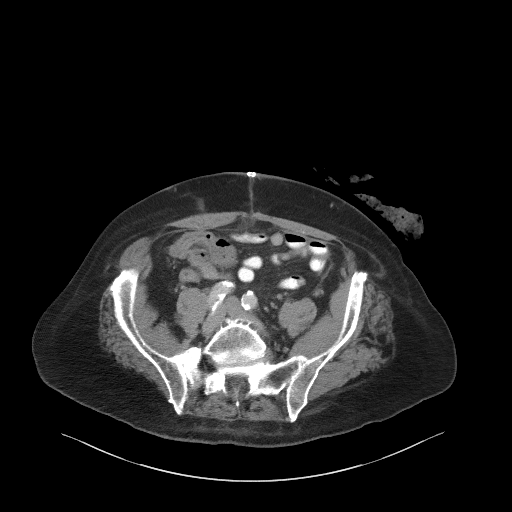
[im 51/102  soft-tissue]
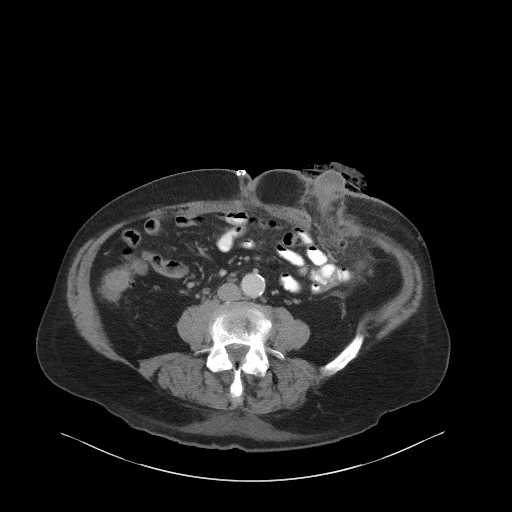
[im 61/102  soft-tissue]
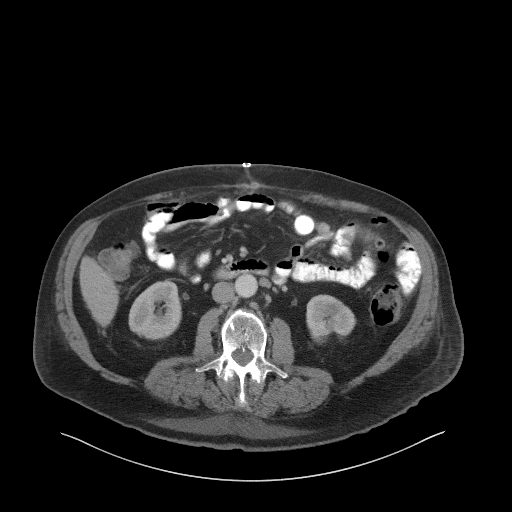
[im 66/102  soft-tissue]
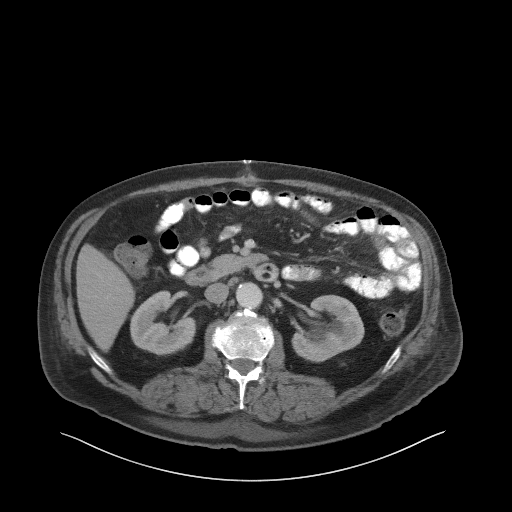
[im 76/102  soft-tissue]
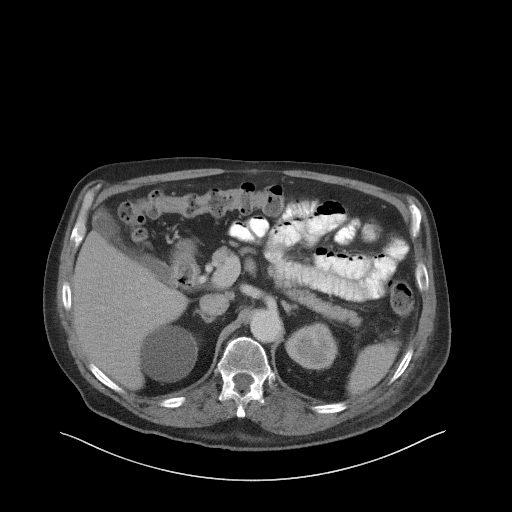
[im 76/102  bone]
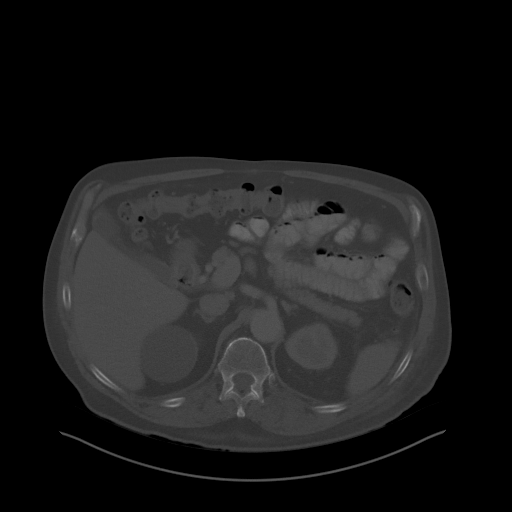
[im 86/102  soft-tissue]
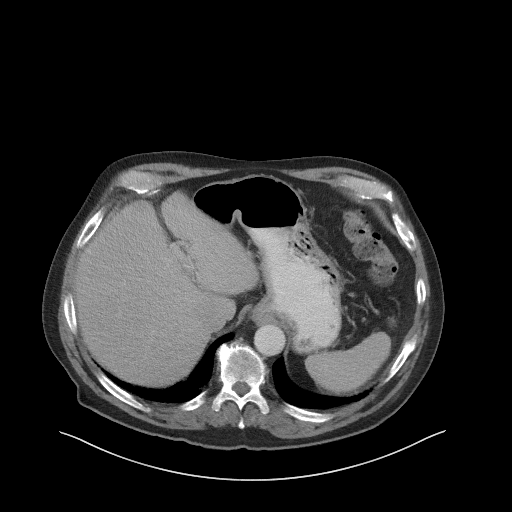
[im 96/102  soft-tissue]
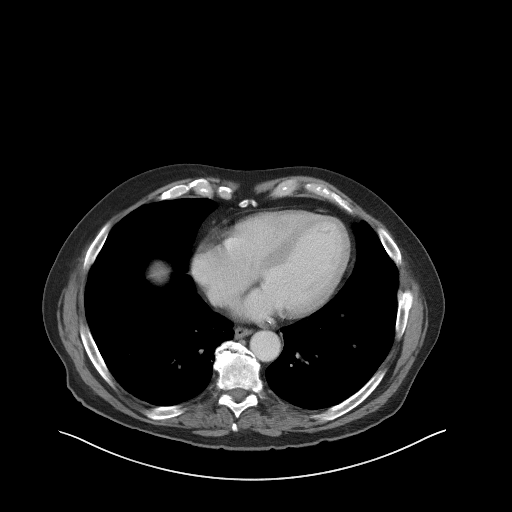

[Series 5: coronal st · coronal · 0.72mm/px · 3 of 95 slices shown]
[im 32/95  soft-tissue]
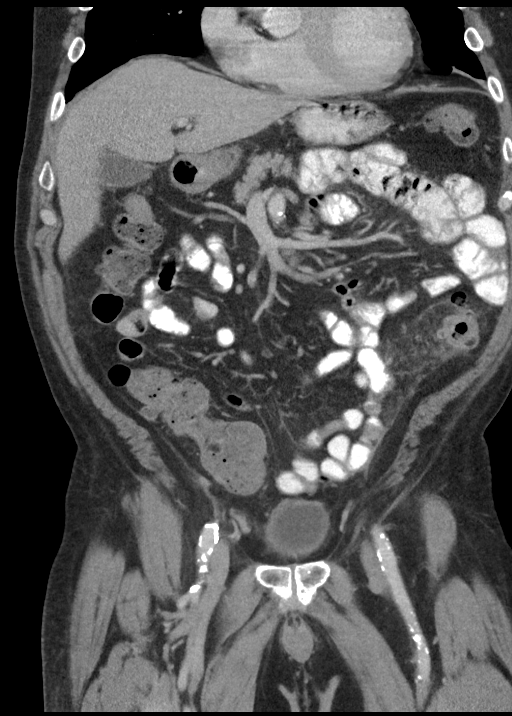
[im 42/95  soft-tissue]
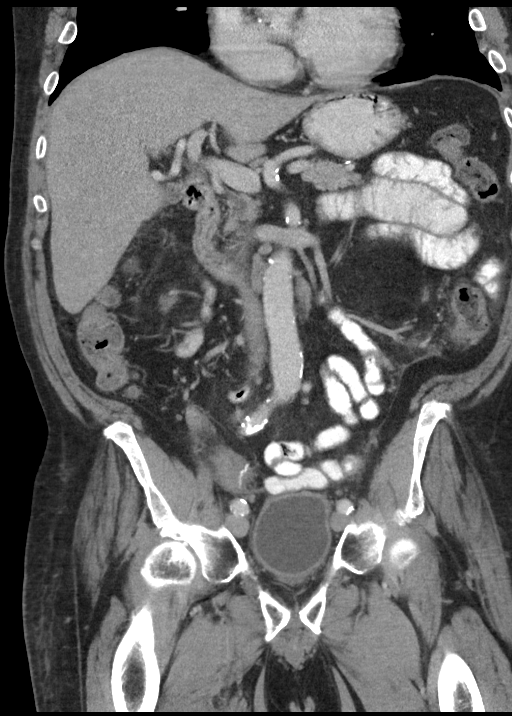
[im 53/95  soft-tissue]
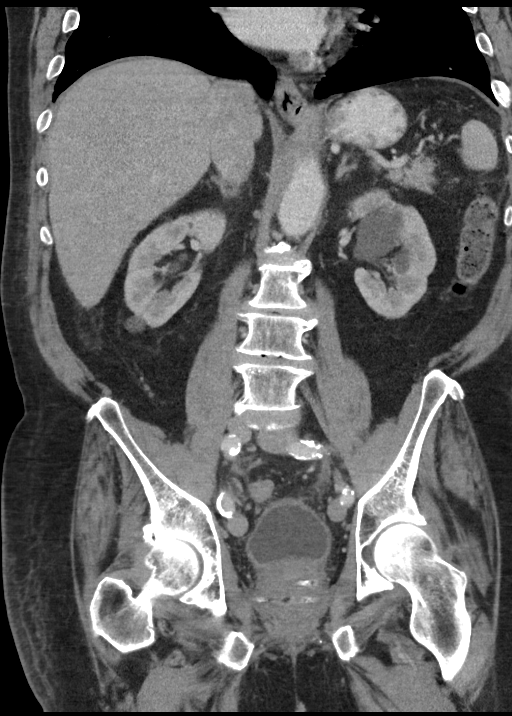

[14 of 46 positions shown; findings below may reference images not displayed]

FINDINGS: Lower chest: Hazy subsegmental atelectatic changes present within
the visualized lung bases. Visualized lungs are otherwise clear.

Hepatobiliary: Liver demonstrates a normal contrast enhanced
appearance. Gallbladder within normal limits. No biliary dilatation.

Pancreas: Pancreas within normal limits.

Spleen: Spleen within normal limits.

Adrenals/Urinary Tract: Adrenal glands are normal. Kidneys equal in
size with symmetric enhancement. Scattered renal cysts seen
bilaterally. No nephrolithiasis, hydronephrosis, or focal enhancing
renal mass. No hydroureter. Partially distended bladder demonstrates
mild wall thickening, favored to reflect incomplete distension,
although possible acute cystitis not excluded. The

Stomach/Bowel: Stomach within normal limits. No evidence for small
bowel obstruction. Postoperative changes from interval partial
colectomy with placement of left lower quadrant colostomy due to
previously seen perforated diverticulitis. There is persistent
inflammatory stranding about multiple diverticulae just proximal to
the colostomy in the left abdomen (series 2, image 50), consistent
with acute diverticulitis. Small focus of adjacent free air adjacent
to an inflamed diverticulum appears to be extraluminal, suggesting a
small focal consistent with localized micro perforation (series 2,
image 42). No abscess. No other acute inflammatory changes seen
about the bowels.

Vascular/Lymphatic: Normal intravascular enhancement seen throughout
the intra- abdominal aorta and its branch vessels. Moderate aorto
bi-iliac atherosclerotic disease. No adenopathy.

Reproductive: Brachia therapy sees present within the prostate.

Other: No other free air or significant free fluid. Scattered soft
tissue stranding within the lower abdomen/pelvis likely
postoperative.

Musculoskeletal: Skin staples overlie a midline incision. No
loculated collection or other abnormality seen along the incision
itself.

No acute osseous abnormality. No worrisome lytic or blastic osseous
lesions. Multiple remotely healed left-sided rib fractures noted.
IMPRESSION: 1. Findings consistent with acute sigmoid diverticulitis within the
left abdomen. Small focus of adjacent extraluminal free air
consistent with focal micro perforation. No abscess.
2. Postoperative changes from interval partial colectomy with left
lower quadrant colostomy formation.
3. Mild circumferential bladder wall thickening, favored to reflect
incomplete distension, although acute cystitis could also have this
appearance. Correlation with urinalysis recommended.
4. Moderate aorto bi-iliac atherosclerotic disease.
Critical Value/emergent results were called by telephone at the time
of interpretation on [DATE] at [DATE] to Dr. LABORDE , who
verbally acknowledged these results.

## 2016-12-23 MED ORDER — DOCUSATE SODIUM 100 MG PO CAPS: 200.0000 mg | ORAL_CAPSULE | Freq: Two times a day (BID) | ORAL | 0 refills | Status: DC

## 2016-12-23 MED ORDER — OXYCODONE-ACETAMINOPHEN 5-325 MG PO TABS: 1.0000 | ORAL_TABLET | ORAL | 0 refills | Status: DC | PRN

## 2016-12-23 MED ORDER — IOPAMIDOL (ISOVUE-300) INJECTION 61%
30.0000 mL | Freq: Once | INTRAVENOUS | Status: AC
  Administered 2016-12-23: 30 mL via ORAL

## 2016-12-23 MED ORDER — IOPAMIDOL (ISOVUE-300) INJECTION 61%
100.0000 mL | Freq: Once | INTRAVENOUS | Status: AC | PRN
  Administered 2016-12-23: 100 mL via INTRAVENOUS

## 2016-12-23 NOTE — ED Notes (Signed)
Patient transported to CT 

## 2016-12-23 NOTE — Telephone Encounter (Signed)
Patient requesting refill on pain medication Oxycodone 5/325. He has 4 left. Pain level 6. Denies fever, chills, nausea or vomiting.He is having abdominal cramping. He does have stool in his bag. He states his pain is worse today.    Spoke with patient and he will pick up prescription today.

## 2016-12-23 NOTE — ED Provider Notes (Signed)
 -----------------------------------------   9:06 PM on 12/23/2016 -----------------------------------------  Discussed with radiology, feel there is some diverticulitis just proximal to the colostomy, and possibly a microperforation as well. Discussed with surgery who will evaluate.  I'll follow-up surgery recommendations for disposition.  ----------------------------------------- 10:27 PM on 12/23/2016 -----------------------------------------  Evaluated by surgery who recommends Colace, continue the antibiotics he is already on, follow-up in clinic next week.   Carrie Mew, MD 12/23/16 [id number]

## 2016-12-23 NOTE — ED Triage Notes (Signed)
Pt to ED from home c/o abd pain sine yesterday.  States had colonoscopy 8/22.  Pain to LUQ and lower mid abd.  Redness noted to incision site, no drainage.  States BM today, denies urinary symptoms.

## 2016-12-23 NOTE — ED Notes (Signed)
Patient states his wife is outside and he needs to tell her that he is being dc but they do not have a phone.  FN alerted to keep eye on patient to ensure he doesn't leave.  MD notified.

## 2016-12-23 NOTE — Consult Note (Addendum)
SURGICAL CONSULTATION NOTE (initial) - cpt: 99244  HISTORY OF PRESENT ILLNESS (HPI):  76 y.o. male presented to [facility] ED today with Left-sided abdominal pain since earlier today, 2 weeks s/p Hartmann's partial colectomy with end colostomy for perforated diverticulitis with purulent peritonitis Adonis Huguenin, 8/22), for which his surgically placed pelvic drain was removed on patient's day of discharge (8/29), at which time he was discharged home with a prescription for Augmentin. Patient states he's had progressively less ostomy output over the past couple of days without emptying his colostomy bag at all today until he took a dose of Miralax and Senna.   Due to his abdominal pain with mild redness surrounding his incisional staples, home RN today advised him to call surgeon on-call, who advised patient to present for ED evaluation, by which time patient's colostomy had resumed passing gas and stool, and when evaluated in ED, patient reports his pain had resolved without any pain medication administered by ED staff. Patient additionally reports he has one day left of the antibiotics prescribed for him and interestingly just earlier today filled a refill for the same antibiotics along with additional narcotic pain medication. Patient otherwise denies N/V, fever/chills, CP, or SOB and says he wouldn't have come in if he'd felt as well as he does now.  Surgery is consulted by ED physician Dr. Joni Fears in this context for evaluation and management of abdominal pain.  PAST MEDICAL HISTORY (PMH):  Past Medical History:  Diagnosis Date  . Alcoholism ([place])   . Arrhythmia   . Arthritis   . Asthma   . Atrial fibrillation ([place])    one episode  . Colon polyp   . Depression   . Diverticulitis   . Diverticulosis 30 years  . Fainting   . GERD (gastroesophageal reflux disease)   . History of blood clots   . Hyperlipidemia   . Hypertension   . Irritable bowel syndrome   . Prostate cancer ([place]) 2012   treated  with radiation therapy.  . Vasovagal syncope   . Vertigo      PAST SURGICAL HISTORY ([place]):  Past Surgical History:  Procedure Laterality Date  . CARDIAC CATHETERIZATION     [place] no stents  . CATARACT EXTRACTION, BILATERAL    . COLON RESECTION SIGMOID N/A 12/07/2016   Procedure: COLON RESECTION SIGMOID;  Surgeon: Clayburn Pert, MD;  Location: [facility] ORS;  Service: General;  Laterality: N/A;  . COLONOSCOPY  2016  . COLONOSCOPY WITH PROPOFOL N/A 05/30/2016   Procedure: COLONOSCOPY WITH PROPOFOL;  Surgeon: Lollie Sails, MD;  Location: [facility] ENDOSCOPY;  Service: Endoscopy;  Laterality: N/A;  . COLOSTOMY Left 12/07/2016   Procedure: COLOSTOMY;  Surgeon: Clayburn Pert, MD;  Location: [facility] ORS;  Service: General;  Laterality: Left;  . ESOPHAGOGASTRODUODENOSCOPY    . ESOPHAGOGASTRODUODENOSCOPY N/A 05/30/2016   Procedure: ESOPHAGOGASTRODUODENOSCOPY (EGD);  Surgeon: Lollie Sails, MD;  Location: [facility] ENDOSCOPY;  Service: Endoscopy;  Laterality: N/A;  . FRACTURE SURGERY Bilateral    right arm and left wrist  . INCISION AND DRAINAGE ABSCESS N/A 12/07/2016   Procedure: DRAINAGE  OF INTRA ABDOMINAL ABSCESS;  Surgeon: Clayburn Pert, MD;  Location: [facility] ORS;  Service: General;  Laterality: N/A;  . KNEE ARTHROSCOPY    . LAPAROTOMY N/A 12/07/2016   Procedure: EXPLORATORY LAPAROTOMY;  Surgeon: Clayburn Pert, MD;  Location: [facility] ORS;  Service: General;  Laterality: N/A;  . PROSTATE SURGERY     Microwave therapy  . REPLACEMENT TOTAL KNEE Right 07/2016  . TONSILLECTOMY    .  TOTAL KNEE ARTHROPLASTY Right 07/27/2016   Procedure: TOTAL KNEE ARTHROPLASTY;  Surgeon: [name], MD;  Location: [facility] ORS;  Service: Orthopedics;  Laterality: Right;  Dr. Erlene Quan had to place Urinary catheter due to prostate cancer history.  Using flexible scope.     MEDICATIONS:  Prior to Admission medications   Medication Sig Start Date End Date Taking? Authorizing Provider  amLODipine (NORVASC) 5 MG  tablet Take 1 tablet (5 mg total) by mouth daily. 02/10/16  Yes Leone Haven, MD  amoxicillin-clavulanate (AUGMENTIN) 875-125 MG tablet Take 1 tablet by mouth 2 (two) times daily. 12/14/16  Yes Florene Glen, MD  lisinopril (PRINIVIL,ZESTRIL) 10 MG tablet TAKE ONE TABLET BY MOUTH ONCE DAILY 12/21/16  Yes Leone Haven, MD  metoprolol succinate (TOPROL-XL) 25 MG 24 hr tablet Take 1 tablet (25 mg total) by mouth daily. 02/10/16  Yes Leone Haven, MD  oxyCODONE-acetaminophen (ROXICET) 5-325 MG tablet Take 1 tablet by mouth every 4 (four) hours as needed for severe pain. 12/23/16  Yes Florene Glen, MD  polyethylene glycol powder (GLYCOLAX/MIRALAX) powder Take 17 g by mouth 2 (two) times daily as needed. Patient taking differently: Take 17 g by mouth daily.  08/03/15  Yes Leone Haven, MD  sertraline (ZOLOFT) 50 MG tablet Take 1 tablet (50 mg total) by mouth daily. Patient taking differently: Take 50 mg by mouth at bedtime.  02/10/16  Yes Leone Haven, MD  HYDROcodone-acetaminophen (NORCO) 7.5-325 MG tablet Take 1 tablet by mouth every 6 (six) hours as needed for moderate pain. Patient not taking: Reported on 12/23/2016 07/29/16   [name], MD  hydrocortisone 2.5 % cream APPLY  CREAM TO AFFECTED AREA TWICE DAILY AS NEEDED FOR  RASH 11/01/16   Leone Haven, MD  meloxicam (MOBIC) 15 MG tablet Take 1 tablet (15 mg total) by mouth daily. Patient not taking: Reported on 12/23/2016 07/29/16   [name], MD     ALLERGIES:  Allergies  Allergen Reactions  . Formaldehyde Rash and Other (See Comments)  . Tape Rash     SOCIAL HISTORY:  Social History   Social History  . Marital status: Married    Spouse name: N/A  . Number of children: N/A  . Years of education: N/A   Occupational History  . Not on file.   Social History Main Topics  . Smoking status: Former Smoker    Years: 27.00    Quit date: 04/18/1988  . Smokeless tobacco: Current User    Types: Chew   . Alcohol use 0.6 oz/week    1 Standard drinks or equivalent per week     Comment: former.  occ.  . Drug use: No  . Sexual activity: Not Currently   Other Topics Concern  . Not on file   Social History Narrative  . No narrative on file    The patient currently resides (home / rehab facility / nursing home): Home The patient normally is (ambulatory / bedbound): Ambulatory   FAMILY HISTORY:  Family History  Problem Relation Age of Onset  . Lung cancer Father   . Bipolar disorder Son   . Sudden death Son        due to drug over dose and cardiac issues  . Prostate cancer Neg Hx   . Bladder Cancer Neg Hx      REVIEW OF SYSTEMS:  Constitutional: denies weight loss, fever, chills, or sweats  Eyes: denies any other vision changes, history of eye injury  ENT: denies sore throat, hearing problems  Respiratory: denies shortness of breath, wheezing  Cardiovascular: denies chest pain, palpitations  Gastrointestinal: abdominal pain, N/V, and bowel function as per HPI Genitourinary: denies burning with urination or urinary frequency Musculoskeletal: denies any other joint pains or cramps  Skin: denies any other rashes or skin discolorations  Neurological: denies any other headache, dizziness, weakness  Psychiatric: denies any other depression, anxiety   All other review of systems were negative   VITAL SIGNS:  Temp:  [98.4 F (36.9 C)] 98.4 F (36.9 C) (09/07 1750) Pulse Rate:  [73] 73 (09/07 1750) Resp:  [18] 18 (09/07 1750) BP: (120)/(96) 120/96 (09/07 1750) SpO2:  [98 %] 98 % (09/07 1750) Weight:  [200 lb (90.7 kg)] 200 lb (90.7 kg) (09/07 1750)     Height: 5' 9"  (175.3 cm) Weight: 200 lb (90.7 kg) BMI (Calculated): 29.52   INTAKE/OUTPUT:  This shift: No intake/output data recorded.  Last 2 shifts: @IOLAST2SHIFTS @   PHYSICAL EXAM:  Constitutional:  -- Normal body habitus  -- Awake, alert, and oriented x3  Eyes:  -- Pupils equally round and reactive to light  -- No  scleral icterus  Ear, nose, and throat:  -- No jugular venous distension  Pulmonary:  -- No crackles  -- Equal breath sounds bilaterally -- Breathing non-labored at rest Cardiovascular:  -- S1, S2 present  -- No pericardial rubs Gastrointestinal:  -- Abdomen soft and non-distended with minimal Left-sided abdominal tenderness to palpation, no guarding/rebound  -- Incision well-approximated with minimal peri-incisional erythema likely reactive secondary to staples and no drainage -- Colostomy is pink and viable with very thick pasty brown stool in colostomy bag -- No abdominal masses appreciated, pulsatile or otherwise  Musculoskeletal and Integumentary:  -- Wounds or skin discoloration: None appreciated except post-surgical abdominal wounds as described above (GI) -- Extremities: B/L UE and LE FROM, hands and feet warm, no edema  Neurologic:  -- Motor function: intact and symmetric -- Sensation: intact and symmetric  Labs:  CBC Latest Ref Rng & Units 12/23/2016 12/13/2016 12/10/2016  WBC 3.8 - 10.6 K/uL 5.9 8.9 8.7  Hemoglobin 13.0 - 18.0 g/dL 10.7(L) 10.7(L) 11.2(L)  Hematocrit 40.0 - 52.0 % 32.0(L) 32.3(L) 33.3(L)  Platelets 150 - 440 K/uL 445(H) 314 216   CMP Latest Ref Rng & Units 12/23/2016 12/12/2016 12/11/2016  Glucose 65 - 99 mg/dL 96 - 119(H)  BUN 6 - 20 mg/dL 15 - 16  Creatinine 0.61 - 1.24 mg/dL 1.26(H) 1.49(H) 1.41(H)  Sodium 135 - 145 mmol/L 136 - 137  Potassium 3.5 - 5.1 mmol/L 4.3 - 3.1(L)  Chloride 101 - 111 mmol/L 102 - 102  CO2 22 - 32 mmol/L 27 - 29  Calcium 8.9 - 10.3 mg/dL 9.2 - 8.5(L)  Total Protein 6.5 - 8.1 g/dL 7.0 - -  Total Bilirubin 0.3 - 1.2 mg/dL 0.5 - -  Alkaline Phos 38 - 126 U/L 69 - -  AST 15 - 41 U/L 17 - -  ALT 17 - 63 U/L 11(L) - -    Imaging studies:  CT Abdomen and Pelvis with IV Contrast (12/23/2016) - personally reviewed with patient at bedside, also demonstrates significant stool burden consistent with patient's recent history of  narcotics-associated constipation Stomach within normal limits. No evidence for small bowel obstruction. Postoperative changes from interval partial colectomy with placement of left lower quadrant colostomy due to previously seen perforated diverticulitis. There is persistent inflammatory stranding about multiple diverticulae just proximal to the colostomy in the  left abdomen (series 2, image 50), consistent with acute diverticulitis. Small focus of adjacent free air adjacent to an inflamed diverticulum appears to be extraluminal, suggesting a small focal consistent with localized micro perforation (series 2, image 42). No abscess. No other acute inflammatory  changes seen about the bowels.   Assessment/Plan: (ICD-10's: K26.32) 76 y.o. male with resolved Left-sided abdominal pain, likely secondary to narcotics-associated constipation and complicated by residual peri-colostomy diverticulitis (currently asymptomatic) and normal WBC, along with pertinent comorbidities including HTN, HLD, asthma, a prior episode of atrial fibrillation, GERD, and a history of prostate cancer, depression, and former tobacco and alcohol abuse.   - agree with bowel regiment   - continue prescribed course of oral antibiotics   - keep previously scheduled outpatient surgical follow-up appointment this Monday (2 days from today)  - Colace BID while taking narcotic pain medications, minimize narcotics, and repeat Mirilax tomorrow prn x1  - okay for discharge, instructed to call if questions or concerns, return if pain returns/worsens  All of the above findings and recommendations were discussed with the patient, his family (wife via phone at bedside), and ED RN, and all of patient's and his family's questions were answered to their expressed satisfaction.  Thank you for the opportunity to participate in this patient's care.  -- Marilynne Drivers Rosana Hoes, MD, [place]: [facility] -  Partnering for exceptional care. Office: [phone]

## 2016-12-23 NOTE — Telephone Encounter (Signed)
12/23/16 5:05 pm  Patient called via the hospital operator.  He had called the officer earlier today with complaints of abdominal pain and was given a prescription for Percocet 5/325 mg.  He called reporting that he had the abdominal pain but also was noting that there was redness over his midline incision, near the ostomy site.  He also felt the pain was more concentrated around the ostomy.  He had some stool and gas in the bag when talking over the phone.  He denies having any bulging around the stoma.  Advised patient to come to the emergency room to be evaluated given the redness and pain, to evaluate for any potential infection or complication.   Olean Ree, MD [place]

## 2016-12-23 NOTE — ED Notes (Signed)
Assisted patient with emptying his colostomy bag, patient is ambulatory to toilet

## 2016-12-23 NOTE — ED Provider Notes (Signed)
[facility] Emergency Department Provider Note  Time seen: 7:33 PM  I have reviewed the triage vital signs and the nursing notes.   HISTORY  Chief Complaint Abdominal Pain and Post-op Problem    HPI Cameron Foster. is a 76 y.o. male With a past medical history of arthritis, asthma, hypertension, hyperlipidemia, recent colostomy 2 weeks ago presents to the emergency department with left lower quadrant abdominal pain. According to the patient with the past 2-3 days he has been experiencing intermittent pain in the left lower quadrant which has become somewhat more constant. He states he is not in any discomfort as long as he is not attempting to sit up or pushing on the area. States the area of discomfort is just below the colostomy bag. States normal drainage into the bag. Denies any known fever, nausea, vomiting, dysuria. Denies any black or bloody ostomy output.  Past Medical History:  Diagnosis Date  . Alcoholism ([place])   . Arrhythmia   . Arthritis   . Asthma   . Atrial fibrillation ([place])    one episode  . Colon polyp   . Depression   . Diverticulitis   . Diverticulosis 30 years  . Fainting   . GERD (gastroesophageal reflux disease)   . History of blood clots   . Hyperlipidemia   . Hypertension   . Irritable bowel syndrome   . Prostate cancer ([place]) 2012   treated with radiation therapy.  . Vasovagal syncope   . Vertigo     Patient Active Problem List   Diagnosis Date Noted  . Diverticulitis 12/05/2016  . History of Moh's micrographic surgery for skin cancer 09/07/2016  . Generalized abdominal pain 02/16/2016  . Vasovagal syncope 01/27/2016  . Diverticulitis of large intestine with perforation without abscess or bleeding 01/18/2016  . Abdominal bloating 01/01/2016  . Actinic keratoses 10/28/2015  . Osteoarthritis of right knee 10/01/2015  . Essential hypertension 08/25/2015  . Chronic abdominal pain 08/25/2015  . Anxiety 08/25/2015  .  Exertional dyspnea 08/12/2015  . Gross hematuria 08/04/2015  . Erectile dysfunction following radiation therapy 08/04/2015  . Urinary retention 08/03/2015  . Myalgia 08/02/2015  . Constipation 08/02/2015  . History of prostate cancer 01/20/2015  . Skin nodule 01/20/2015  . Swelling of right lower extremity 01/20/2015    Past Surgical History:  Procedure Laterality Date  . CARDIAC CATHETERIZATION     [place] no stents  . CATARACT EXTRACTION, BILATERAL    . COLON RESECTION SIGMOID N/A 12/07/2016   Procedure: COLON RESECTION SIGMOID;  Surgeon: Clayburn Pert, MD;  Location: [facility] ORS;  Service: General;  Laterality: N/A;  . COLONOSCOPY  2016  . COLONOSCOPY WITH PROPOFOL N/A 05/30/2016   Procedure: COLONOSCOPY WITH PROPOFOL;  Surgeon: Lollie Sails, MD;  Location: [facility] ENDOSCOPY;  Service: Endoscopy;  Laterality: N/A;  . COLOSTOMY Left 12/07/2016   Procedure: COLOSTOMY;  Surgeon: Clayburn Pert, MD;  Location: [facility] ORS;  Service: General;  Laterality: Left;  . ESOPHAGOGASTRODUODENOSCOPY    . ESOPHAGOGASTRODUODENOSCOPY N/A 05/30/2016   Procedure: ESOPHAGOGASTRODUODENOSCOPY (EGD);  Surgeon: Lollie Sails, MD;  Location: [facility] ENDOSCOPY;  Service: Endoscopy;  Laterality: N/A;  . FRACTURE SURGERY Bilateral    right arm and left wrist  . INCISION AND DRAINAGE ABSCESS N/A 12/07/2016   Procedure: DRAINAGE  OF INTRA ABDOMINAL ABSCESS;  Surgeon: Clayburn Pert, MD;  Location: [facility] ORS;  Service: General;  Laterality: N/A;  . KNEE ARTHROSCOPY    . LAPAROTOMY N/A 12/07/2016   Procedure: EXPLORATORY  LAPAROTOMY;  Surgeon: Clayburn Pert, MD;  Location: [facility] ORS;  Service: General;  Laterality: N/A;  . PROSTATE SURGERY     Microwave therapy  . REPLACEMENT TOTAL KNEE Right 07/2016  . TONSILLECTOMY    . TOTAL KNEE ARTHROPLASTY Right 07/27/2016   Procedure: TOTAL KNEE ARTHROPLASTY;  Surgeon: [name], MD;  Location: [facility] ORS;  Service: Orthopedics;  Laterality: Right;  Dr. Erlene Quan  had to place Urinary catheter due to prostate cancer history.  Using flexible scope.    Prior to Admission medications   Medication Sig Start Date End Date Taking? Authorizing Provider  amLODipine (NORVASC) 5 MG tablet Take 1 tablet (5 mg total) by mouth daily. 02/10/16  Yes Leone Haven, MD  amoxicillin-clavulanate (AUGMENTIN) 875-125 MG tablet Take 1 tablet by mouth 2 (two) times daily. 12/14/16  Yes Florene Glen, MD  lisinopril (PRINIVIL,ZESTRIL) 10 MG tablet TAKE ONE TABLET BY MOUTH ONCE DAILY 12/21/16  Yes Leone Haven, MD  metoprolol succinate (TOPROL-XL) 25 MG 24 hr tablet Take 1 tablet (25 mg total) by mouth daily. 02/10/16  Yes Leone Haven, MD  oxyCODONE-acetaminophen (ROXICET) 5-325 MG tablet Take 1 tablet by mouth every 4 (four) hours as needed for severe pain. 12/23/16  Yes Florene Glen, MD  polyethylene glycol powder (GLYCOLAX/MIRALAX) powder Take 17 g by mouth 2 (two) times daily as needed. Patient taking differently: Take 17 g by mouth daily.  08/03/15  Yes Leone Haven, MD  sertraline (ZOLOFT) 50 MG tablet Take 1 tablet (50 mg total) by mouth daily. Patient taking differently: Take 50 mg by mouth at bedtime.  02/10/16  Yes Leone Haven, MD  HYDROcodone-acetaminophen (NORCO) 7.5-325 MG tablet Take 1 tablet by mouth every 6 (six) hours as needed for moderate pain. Patient not taking: Reported on 12/23/2016 07/29/16   [name], MD  hydrocortisone 2.5 % cream APPLY  CREAM TO AFFECTED AREA TWICE DAILY AS NEEDED FOR  RASH 11/01/16   Leone Haven, MD  meloxicam (MOBIC) 15 MG tablet Take 1 tablet (15 mg total) by mouth daily. Patient not taking: Reported on 12/23/2016 07/29/16   [name], MD    Allergies  Allergen Reactions  . Formaldehyde Rash and Other (See Comments)  . Tape Rash    Family History  Problem Relation Age of Onset  . Lung cancer Father   . Bipolar disorder Son   . Sudden death Son        due to drug over dose and  cardiac issues  . Prostate cancer Neg Hx   . Bladder Cancer Neg Hx     Social History Social History  Substance Use Topics  . Smoking status: Former Smoker    Years: 27.00    Quit date: 04/18/1988  . Smokeless tobacco: Current User    Types: Chew  . Alcohol use 0.6 oz/week    1 Standard drinks or equivalent per week     Comment: former.  occ.    Review of Systems Constitutional: Negative for fever Cardiovascular: Negative for chest pain. Respiratory: Negative for shortness of breath. Gastrointestinal: lower abdominal pain. Negative for nausea vomiting or diarrhea. Normal ostomy output. Genitourinary: Negative for dysuria. All other ROS negative  ____________________________________________   PHYSICAL EXAM:  VITAL SIGNS: ED Triage Vitals  Enc Vitals Group     BP 12/23/16 1750 (!) 120/96     Pulse Rate 12/23/16 1750 73     Resp 12/23/16 1750 18     Temp 12/23/16 1750  98.4 F (36.9 C)     Temp Source 12/23/16 1750 Oral     SpO2 12/23/16 1750 98 %     Weight 12/23/16 1750 200 lb (90.7 kg)     Height 12/23/16 1750 5' 9"  (1.753 m)     Head Circumference --      Peak Flow --      Pain Score 12/23/16 1749 6     Pain Loc --      Pain Edu? --      Excl. in [place]? --     Constitutional: Alert and oriented. Well appearing and in no distress. Eyes: Normal exam ENT   Head: Normocephalic and atraumatic   Mouth/Throat: Mucous membranes are moist. Cardiovascular: Normal rate, regular rhythm. Respiratory: Normal respiratory effort without tachypnea nor retractions. Breath sounds are clear  Gastrointestinal: soft, incision still has staples, mild erythema surrounding the incision to the bottom 5 or 6 cm of the incision, but no dehiscence, no drainage, and nontender. . Patient does have moderate tenderness just below and to the left upper patient's colostomy. The colostomy itself appears well with normal output. No rebound, no guarding, no distention. Musculoskeletal:  Nontender with normal range of motion in all extremities. Neurologic:  Normal speech and language. No gross focal neurologic deficits Skin:  Skin is warm, dry and intact.  Psychiatric: Mood and affect are normal.  ____________________________________________   RADIOLOGY  CT pending  ____________________________________________   INITIAL IMPRESSION / ASSESSMENT AND PLAN / ED COURSE  Pertinent labs & imaging results that were available during my care of the patient were reviewed by me and considered in my medical decision making (see chart for details).  patient just the emergency department for left lower quadrant abdominal pain 2 weeks after a colostomy. Patient doesn't moderate tenderness to palpation inferior and to the left of the colostomy. We'll obtain a CT of the abdomen/pelvis to further evaluate. We will consult Gen. surgery as well. Overall the patient appears well, no distress lying in bed, normal labs including [place] blood cell count.  I discussed the patient with Dr. Rosana Hoes. We will wait for CT first and then I will call surgery to decide upon further care/disposition.  CT scan pending. Patient care signed out to Dr. Joni Fears.  ____________________________________________   FINAL CLINICAL IMPRESSION(S) / ED DIAGNOSES  left lower quadrant abdominal pain    Harvest Dark, MD 12/23/16 2008

## 2016-12-23 NOTE — ED Notes (Addendum)
CT notified patient has IV and has completed oral contrast

## 2016-12-26 ENCOUNTER — Ambulatory Visit (INDEPENDENT_AMBULATORY_CARE_PROVIDER_SITE_OTHER): Payer: Medicare Other | Admitting: General Surgery

## 2016-12-26 ENCOUNTER — Telehealth: Payer: Self-pay | Admitting: General Practice

## 2016-12-26 ENCOUNTER — Other Ambulatory Visit: Payer: Self-pay

## 2016-12-26 DIAGNOSIS — Z433 Encounter for attention to colostomy: Secondary | ICD-10-CM | POA: Diagnosis not present

## 2016-12-26 DIAGNOSIS — K219 Gastro-esophageal reflux disease without esophagitis: Secondary | ICD-10-CM | POA: Diagnosis not present

## 2016-12-26 DIAGNOSIS — K5720 Diverticulitis of large intestine with perforation and abscess without bleeding: Secondary | ICD-10-CM | POA: Diagnosis not present

## 2016-12-26 DIAGNOSIS — Z48815 Encounter for surgical aftercare following surgery on the digestive system: Secondary | ICD-10-CM | POA: Diagnosis not present

## 2016-12-26 DIAGNOSIS — Z4889 Encounter for other specified surgical aftercare: Secondary | ICD-10-CM

## 2016-12-26 DIAGNOSIS — F329 Major depressive disorder, single episode, unspecified: Secondary | ICD-10-CM | POA: Diagnosis not present

## 2016-12-26 DIAGNOSIS — I10 Essential (primary) hypertension: Secondary | ICD-10-CM | POA: Diagnosis not present

## 2016-12-26 MED ORDER — TRAMADOL HCL 50 MG PO TABS: 50.0000 mg | ORAL_TABLET | Freq: Four times a day (QID) | ORAL | 0 refills | Status: DC | PRN

## 2016-12-26 NOTE — Progress Notes (Signed)
Outpatient Surgical Follow Up  12/26/2016  Cameron Foster. is an 76 y.o. male.   No chief complaint on file.   HPI: 75 year old male returns to clinic now approximately 2 weeks status post sigmoid colectomy for perforated diverticulitis. Patient was seen in the ER due to abdominal pain last week. He states that the pain resolves when he had return of bowel function. He relates that the pain started after taking pain medication which causes ostomy output decreased. He has since decreased his pain medication intake and is also be function has returned to previous levels and his pain is completely resolved. He states he is eating well and denies any nausea, vomiting, chest pain, shortness of breath, fevers, chills.  Past Medical History:  Diagnosis Date  . Alcoholism ([place])   . Arrhythmia   . Arthritis   . Asthma   . Atrial fibrillation ([place])    one episode  . Colon polyp   . Depression   . Diverticulitis   . Diverticulosis 30 years  . Fainting   . GERD (gastroesophageal reflux disease)   . History of blood clots   . Hyperlipidemia   . Hypertension   . Irritable bowel syndrome   . Prostate cancer ([place]) 2012   treated with radiation therapy.  . Vasovagal syncope   . Vertigo     Past Surgical History:  Procedure Laterality Date  . CARDIAC CATHETERIZATION     [place] no stents  . CATARACT EXTRACTION, BILATERAL    . COLON RESECTION SIGMOID N/A 12/07/2016   Procedure: COLON RESECTION SIGMOID;  Surgeon: Clayburn Pert, MD;  Location: [facility] ORS;  Service: General;  Laterality: N/A;  . COLONOSCOPY  2016  . COLONOSCOPY WITH PROPOFOL N/A 05/30/2016   Procedure: COLONOSCOPY WITH PROPOFOL;  Surgeon: Lollie Sails, MD;  Location: [facility] ENDOSCOPY;  Service: Endoscopy;  Laterality: N/A;  . COLOSTOMY Left 12/07/2016   Procedure: COLOSTOMY;  Surgeon: Clayburn Pert, MD;  Location: [facility] ORS;  Service: General;  Laterality: Left;  . ESOPHAGOGASTRODUODENOSCOPY    .  ESOPHAGOGASTRODUODENOSCOPY N/A 05/30/2016   Procedure: ESOPHAGOGASTRODUODENOSCOPY (EGD);  Surgeon: Lollie Sails, MD;  Location: [facility] ENDOSCOPY;  Service: Endoscopy;  Laterality: N/A;  . FRACTURE SURGERY Bilateral    right arm and left wrist  . INCISION AND DRAINAGE ABSCESS N/A 12/07/2016   Procedure: DRAINAGE  OF INTRA ABDOMINAL ABSCESS;  Surgeon: Clayburn Pert, MD;  Location: [facility] ORS;  Service: General;  Laterality: N/A;  . KNEE ARTHROSCOPY    . LAPAROTOMY N/A 12/07/2016   Procedure: EXPLORATORY LAPAROTOMY;  Surgeon: Clayburn Pert, MD;  Location: [facility] ORS;  Service: General;  Laterality: N/A;  . PROSTATE SURGERY     Microwave therapy  . REPLACEMENT TOTAL KNEE Right 07/2016  . TONSILLECTOMY    . TOTAL KNEE ARTHROPLASTY Right 07/27/2016   Procedure: TOTAL KNEE ARTHROPLASTY;  Surgeon: [name], MD;  Location: [facility] ORS;  Service: Orthopedics;  Laterality: Right;  Dr. Erlene Quan had to place Urinary catheter due to prostate cancer history.  Using flexible scope.    Family History  Problem Relation Age of Onset  . Lung cancer Father   . Bipolar disorder Son   . Sudden death Son        due to drug over dose and cardiac issues  . Prostate cancer Neg Hx   . Bladder Cancer Neg Hx     Social History:  reports that he quit smoking about 28 years ago. He quit after 27.00 years of use. His smokeless tobacco  use includes Chew. He reports that he drinks about 0.6 oz of alcohol per week . He reports that he does not use drugs.  Allergies:  Allergies  Allergen Reactions  . Formaldehyde Rash and Other (See Comments)  . Tape Rash    Medications reviewed.    ROS A multipoint review of systems was completed, all pertinent positives and negatives are documented within the history of present illness and the remainder are negative   There were no vitals taken for this visit.  Physical Exam Gen.: No acute distress Chest: Clear to auscultation  heart: Regular rhythm Abdomen: Soft,  nontender, nondistended. Colostomy present in the left lower quadrant is pink, patent, productive of gas and stool. Midline staples well approximated with minimal hyperemia around them but without any evidence of erythema or drainage.    No results found for this or any previous visit (from the past 48 hour(s)). No results found.  Assessment/Plan:  1. Aftercare following surgery 76 year old male status post sigmoid colectomy for perforated diverticulitis. Continuing to improve. Staples removed today and replaced with Steri-Strips. Counseled him to return to clinic immediately should he have any concerns otherwise he'll follow-up in clinic in 2 weeks for additional wound check and to set up follow-up endoscopy. Also provided with a prescription for Ultram as a bridge for pain control.     Clayburn Pert, MD FACS General Surgeon  12/26/2016,5:54 PM

## 2016-12-26 NOTE — Telephone Encounter (Signed)
Alena with [facility] called and left a message on Friday @ 3:31 said she was at the patients home changing his bag, Alena said the patient was having some pain above the stomach, and around the stiches was very red around it, please call patient and advice.

## 2016-12-26 NOTE — Telephone Encounter (Signed)
Returned call to alena at this time. Patient has post op visit today @ 4 pm with Dr.Woodham.

## 2016-12-26 NOTE — Patient Instructions (Signed)
We will see you back in 2 weeks as scheduled below.  Your steri-strips will fall off on their own in 2 weeks. You may shower as you normally do during this time but do not submerge in a bathtub, tub or pool.  Please call with any questions or concerns prior to your next scheduled appointment.

## 2016-12-27 DIAGNOSIS — K219 Gastro-esophageal reflux disease without esophagitis: Secondary | ICD-10-CM | POA: Diagnosis not present

## 2016-12-27 DIAGNOSIS — F329 Major depressive disorder, single episode, unspecified: Secondary | ICD-10-CM | POA: Diagnosis not present

## 2016-12-27 DIAGNOSIS — Z433 Encounter for attention to colostomy: Secondary | ICD-10-CM | POA: Diagnosis not present

## 2016-12-27 DIAGNOSIS — I10 Essential (primary) hypertension: Secondary | ICD-10-CM | POA: Diagnosis not present

## 2016-12-27 DIAGNOSIS — Z48815 Encounter for surgical aftercare following surgery on the digestive system: Secondary | ICD-10-CM | POA: Diagnosis not present

## 2016-12-27 DIAGNOSIS — K5720 Diverticulitis of large intestine with perforation and abscess without bleeding: Secondary | ICD-10-CM | POA: Diagnosis not present

## 2016-12-29 DIAGNOSIS — Z48815 Encounter for surgical aftercare following surgery on the digestive system: Secondary | ICD-10-CM | POA: Diagnosis not present

## 2016-12-29 DIAGNOSIS — Z433 Encounter for attention to colostomy: Secondary | ICD-10-CM | POA: Diagnosis not present

## 2016-12-29 DIAGNOSIS — F329 Major depressive disorder, single episode, unspecified: Secondary | ICD-10-CM | POA: Diagnosis not present

## 2016-12-29 DIAGNOSIS — K219 Gastro-esophageal reflux disease without esophagitis: Secondary | ICD-10-CM | POA: Diagnosis not present

## 2016-12-29 DIAGNOSIS — I10 Essential (primary) hypertension: Secondary | ICD-10-CM | POA: Diagnosis not present

## 2016-12-29 DIAGNOSIS — K5720 Diverticulitis of large intestine with perforation and abscess without bleeding: Secondary | ICD-10-CM | POA: Diagnosis not present

## 2017-01-02 DIAGNOSIS — K5720 Diverticulitis of large intestine with perforation and abscess without bleeding: Secondary | ICD-10-CM | POA: Diagnosis not present

## 2017-01-02 DIAGNOSIS — Z433 Encounter for attention to colostomy: Secondary | ICD-10-CM | POA: Diagnosis not present

## 2017-01-02 DIAGNOSIS — I10 Essential (primary) hypertension: Secondary | ICD-10-CM | POA: Diagnosis not present

## 2017-01-02 DIAGNOSIS — K219 Gastro-esophageal reflux disease without esophagitis: Secondary | ICD-10-CM | POA: Diagnosis not present

## 2017-01-02 DIAGNOSIS — F329 Major depressive disorder, single episode, unspecified: Secondary | ICD-10-CM | POA: Diagnosis not present

## 2017-01-02 DIAGNOSIS — Z48815 Encounter for surgical aftercare following surgery on the digestive system: Secondary | ICD-10-CM | POA: Diagnosis not present

## 2017-01-03 ENCOUNTER — Encounter: Payer: Self-pay | Admitting: Family Medicine

## 2017-01-03 ENCOUNTER — Ambulatory Visit (INDEPENDENT_AMBULATORY_CARE_PROVIDER_SITE_OTHER): Payer: Medicare Other | Admitting: Family Medicine

## 2017-01-03 ENCOUNTER — Telehealth: Payer: Self-pay

## 2017-01-03 DIAGNOSIS — K5900 Constipation, unspecified: Secondary | ICD-10-CM | POA: Diagnosis not present

## 2017-01-03 DIAGNOSIS — Z9049 Acquired absence of other specified parts of digestive tract: Secondary | ICD-10-CM | POA: Insufficient documentation

## 2017-01-03 DIAGNOSIS — F320 Major depressive disorder, single episode, mild: Secondary | ICD-10-CM | POA: Diagnosis not present

## 2017-01-03 NOTE — Telephone Encounter (Signed)
Patient called wanting to know if needed to take another round of amoxicillin. He stated that he has finished the two arounds of antibiotics. I told him that I didn't see in Dr. Reginal Lutes note that another round would be needed at this time and that usually what we send in to the pharmacy is all that we suggest until further elevated. Patient was thankful stated that he didn't want to take another round. I reminded patient of his follow up appointment and that if he had any questions or concerns prior to then to give [place] a call. Patient verbalized understanding.

## 2017-01-03 NOTE — Assessment & Plan Note (Signed)
Patient status post sigmoid colectomy. He has done quite well with this. Benign abdominal exam today. Wounds appear to be well healing. Advised patient to discuss with home health nurse regarding method for keeping the ostomy bag in place. He'll follow-up with his surgeon as well. Given return precautions.

## 2017-01-03 NOTE — Assessment & Plan Note (Signed)
Improved with current bowel regimen. He'll monitor for recurrence.

## 2017-01-03 NOTE — Assessment & Plan Note (Signed)
Patient with mild depression related to his current situation. He is currently on Zoloft. I offered to increase this though he declined and opted to see how he does moving forward. He'll continue to monitor.

## 2017-01-03 NOTE — Progress Notes (Signed)
  Cameron Rumps, MD Phone: [phone]  Cameron Foster. is a 76 y.o. male who presents today for follow-up.  Patient underwent sigmoid colectomy for perforated diverticulitis. He currently has a colostomy and has been doing okay with that though does have some issues with the tape sticking and subsequently leaking stool when it comes loose. He notes his pain is fairly well controlled. Taking Ultram as needed. He completed his Augmentin though does note it makes him sick on his stomach. He's had 2 episodes where he has leaked from the bag and it irritates his skin though this is underneath the tape at this time. Notes not having much of an appetite since the surgery. A little bit of depression with this situation is currently in. No SI. He did go back to the emergency room with constipation though he had quit taking his constipation regimen.  PMH: Former smoker   ROS see history of present illness  Objective  Physical Exam Vitals:   01/03/17 1522  BP: 120/72  Pulse: 93  Temp: 98.3 F (36.8 C)  SpO2: 95%    BP Readings from Last 3 Encounters:  01/03/17 120/72  12/23/16 120/88  12/14/16 (!) 143/80   Wt Readings from Last 3 Encounters:  01/03/17 196 lb 3.2 oz (89 kg)  12/23/16 200 lb (90.7 kg)  12/07/16 200 lb (90.7 kg)    Physical Exam  Constitutional: No distress.  Cardiovascular: Normal rate, regular rhythm and normal heart sounds.   Pulmonary/Chest: Effort normal and breath sounds normal.  Abdominal: Soft. Bowel sounds are normal. He exhibits no distension. There is no tenderness. There is no rebound and no guarding.  Ostomy bag in place with brown stool, no surrounding irritation observe, midline scar is well healing with Steri-Strips still in place, 2-3 Steri-Strips that were no longer over the midline scar were removed, a Steri-Strips left in place   Musculoskeletal: He exhibits no edema.  Neurological: He is alert. Gait normal.  Skin: Skin is warm and dry. He is  not diaphoretic.     Assessment/Plan: Please see individual problem list.  Constipation Improved with current bowel regimen. He'll monitor for recurrence.  Status post partial colectomy Patient status post sigmoid colectomy. He has done quite well with this. Benign abdominal exam today. Wounds appear to be well healing. Advised patient to discuss with home health nurse regarding method for keeping the ostomy bag in place. He'll follow-up with his surgeon as well. Given return precautions.  Depression, major, single episode, mild ([place]) Patient with mild depression related to his current situation. He is currently on Zoloft. I offered to increase this though he declined and opted to see how he does moving forward. He'll continue to monitor.  Cameron Rumps, MD [place]

## 2017-01-03 NOTE — Patient Instructions (Addendum)
Nice to see you. I am glad you are doing well following your surgery. Please check with your home health nurse to see if they have any tips to keep the bag attached better. Please monitor your appetite. Please monitor your depression and if this worsens please let us know. If you have abdominal pain, fevers, thoughts of harming yourself, or any new or changing symptoms please seek medical attention.

## 2017-01-05 ENCOUNTER — Telehealth: Payer: Self-pay | Admitting: General Practice

## 2017-01-05 DIAGNOSIS — Z48815 Encounter for surgical aftercare following surgery on the digestive system: Secondary | ICD-10-CM | POA: Diagnosis not present

## 2017-01-05 DIAGNOSIS — F329 Major depressive disorder, single episode, unspecified: Secondary | ICD-10-CM | POA: Diagnosis not present

## 2017-01-05 DIAGNOSIS — Z433 Encounter for attention to colostomy: Secondary | ICD-10-CM | POA: Diagnosis not present

## 2017-01-05 DIAGNOSIS — K219 Gastro-esophageal reflux disease without esophagitis: Secondary | ICD-10-CM | POA: Diagnosis not present

## 2017-01-05 DIAGNOSIS — K5720 Diverticulitis of large intestine with perforation and abscess without bleeding: Secondary | ICD-10-CM | POA: Diagnosis not present

## 2017-01-05 DIAGNOSIS — I10 Essential (primary) hypertension: Secondary | ICD-10-CM | POA: Diagnosis not present

## 2017-01-05 NOTE — Telephone Encounter (Signed)
Patient is calling, patient is complaining of his colostomy bag every time he changes it gets worse, said it has leaked twice, the patch is causing irritation to the skin Please call the patient and advice.

## 2017-01-05 NOTE — Telephone Encounter (Signed)
Called patient back and he explained that when he removes his ostomy bag, his skin is raw. He has had two incidents of his ostomy bag leaking because it does not seal tight. I told the patient that I would send an email to our ostomy nurse to ask her what is best for the patient to do at this time. I told patient that I would call him back in case she replies back with any suggestions or if he needs to see her. Patient understood and agreed.

## 2017-01-06 NOTE — Telephone Encounter (Signed)
Called patient back to let him know that Domenic Moras had not replied to my e-mail. However, as soon as she did, that I would call him and let him know. Patient agreed and had no further questions.

## 2017-01-09 ENCOUNTER — Telehealth: Payer: Self-pay | Admitting: General Surgery

## 2017-01-09 DIAGNOSIS — I10 Essential (primary) hypertension: Secondary | ICD-10-CM | POA: Diagnosis not present

## 2017-01-09 DIAGNOSIS — Z48815 Encounter for surgical aftercare following surgery on the digestive system: Secondary | ICD-10-CM | POA: Diagnosis not present

## 2017-01-09 DIAGNOSIS — K5720 Diverticulitis of large intestine with perforation and abscess without bleeding: Secondary | ICD-10-CM | POA: Diagnosis not present

## 2017-01-09 DIAGNOSIS — F329 Major depressive disorder, single episode, unspecified: Secondary | ICD-10-CM | POA: Diagnosis not present

## 2017-01-09 DIAGNOSIS — Z433 Encounter for attention to colostomy: Secondary | ICD-10-CM | POA: Diagnosis not present

## 2017-01-09 DIAGNOSIS — K219 Gastro-esophageal reflux disease without esophagitis: Secondary | ICD-10-CM | POA: Diagnosis not present

## 2017-01-09 MED ORDER — NYSTATIN 100000 UNIT/GM EX POWD: Freq: Four times a day (QID) | CUTANEOUS | 0 refills | Status: DC

## 2017-01-09 NOTE — Telephone Encounter (Signed)
Nurse from [facility] has called and left a message that when visiting patient for colostomy care, the site is very red and yeasty. She did not leave her name. She would like to know if there is a medication that the physician would like to call in. Please call her back at [phone].

## 2017-01-09 NOTE — Telephone Encounter (Signed)
Spoke with Dr. Hampton Abbot regarding patient. He has prescribed Nystatin Powder to be applied around Ostomy area.   Medication was sent in to [facility] on [address].  Returned phone call to nurse at this time. No answer. Left voicemail for return phone call regarding this matter.

## 2017-01-10 ENCOUNTER — Encounter: Payer: Self-pay | Admitting: General Surgery

## 2017-01-10 DIAGNOSIS — F329 Major depressive disorder, single episode, unspecified: Secondary | ICD-10-CM | POA: Diagnosis not present

## 2017-01-10 DIAGNOSIS — Z433 Encounter for attention to colostomy: Secondary | ICD-10-CM | POA: Diagnosis not present

## 2017-01-10 DIAGNOSIS — Z48815 Encounter for surgical aftercare following surgery on the digestive system: Secondary | ICD-10-CM | POA: Diagnosis not present

## 2017-01-10 DIAGNOSIS — K5720 Diverticulitis of large intestine with perforation and abscess without bleeding: Secondary | ICD-10-CM | POA: Diagnosis not present

## 2017-01-10 DIAGNOSIS — I10 Essential (primary) hypertension: Secondary | ICD-10-CM | POA: Diagnosis not present

## 2017-01-10 DIAGNOSIS — K219 Gastro-esophageal reflux disease without esophagitis: Secondary | ICD-10-CM | POA: Diagnosis not present

## 2017-01-11 ENCOUNTER — Telehealth: Payer: Self-pay | Admitting: General Surgery

## 2017-01-11 NOTE — Telephone Encounter (Signed)
Patient has called and states that the Nystatin Powder that is to be applied around Ostomy area is a very small bottle and the directions states that he is to apply this 4 times a day. He states that he does not change his dressing 4 times a day, that he only changes his dressing once every 3 days. Please call patient to instruct him on the use of this medication. Phone [phone]

## 2017-01-11 NOTE — Telephone Encounter (Signed)
Patient was instructed to apply Nystatin powder when he changes the bag. He stated he has been cleaning the area with baby wipes and it has caused the bag to not adhere as it should per the home health nurse [place].  He has an appointment tomorrow with the ostomy nurse Santiago Glad and Dr.Woodham.

## 2017-01-12 ENCOUNTER — Encounter: Payer: Self-pay | Admitting: General Surgery

## 2017-01-12 ENCOUNTER — Ambulatory Visit (INDEPENDENT_AMBULATORY_CARE_PROVIDER_SITE_OTHER): Payer: Medicare Other | Admitting: General Surgery

## 2017-01-12 VITALS — BP 155/90 | HR 78 | Temp 97.3°F | Wt 196.0 lb

## 2017-01-12 DIAGNOSIS — Z4889 Encounter for other specified surgical aftercare: Secondary | ICD-10-CM

## 2017-01-12 MED ORDER — FLEET ENEMA 7-19 GM/118ML RE ENEM: 1.0000 | ENEMA | Freq: Once | RECTAL | 0 refills | Status: DC

## 2017-01-12 MED ORDER — ERYTHROMYCIN BASE 500 MG PO TABS: 500.0000 mg | ORAL_TABLET | Freq: Three times a day (TID) | ORAL | 0 refills | Status: DC

## 2017-01-12 MED ORDER — BISACODYL EC 5 MG PO TBEC: DELAYED_RELEASE_TABLET | ORAL | 0 refills | Status: DC

## 2017-01-12 MED ORDER — POLYETHYLENE GLYCOL 3350 17 GM/SCOOP PO POWD: 1.0000 | Freq: Once | ORAL | 0 refills | Status: AC

## 2017-01-12 MED ORDER — NEOMYCIN SULFATE 500 MG PO TABS: 1000.0000 mg | ORAL_TABLET | Freq: Three times a day (TID) | ORAL | 0 refills | Status: DC

## 2017-01-12 NOTE — Progress Notes (Signed)
Outpatient Surgical Follow Up  01/12/2017  Cameron Foster. is an 76 y.o. male.   No chief complaint on file.   HPI: 76 year old male returns to clinic now 1 month status post sigmoid colectomy for perforated diverticulitis. Primarily has complaints related to his ostomy. However states he is tolerating a diet and having ostomy function. He denies any fevers, chills, nausea, vomiting, chest pain, shortness of breath. He is frustrated with you with his ostomy and strongly desires to have her reversed as soon as possible. Patient continues to intermittently knee pain medications to help sleep at night secondary to abdominal discomfort.  Past Medical History:  Diagnosis Date  . Alcoholism ([place])   . Arrhythmia   . Arthritis   . Asthma   . Atrial fibrillation ([place])    one episode  . Colon polyp   . Depression   . Diverticulitis   . Diverticulosis 30 years  . Fainting   . GERD (gastroesophageal reflux disease)   . History of blood clots   . Hyperlipidemia   . Hypertension   . Irritable bowel syndrome   . Prostate cancer ([place]) 2012   treated with radiation therapy.  . Vasovagal syncope   . Vertigo     Past Surgical History:  Procedure Laterality Date  . CARDIAC CATHETERIZATION     [place] no stents  . CATARACT EXTRACTION, BILATERAL    . COLON RESECTION SIGMOID N/A 12/07/2016   Procedure: COLON RESECTION SIGMOID;  Surgeon: Clayburn Pert, MD;  Location: [facility] ORS;  Service: General;  Laterality: N/A;  . COLONOSCOPY  2016  . COLONOSCOPY WITH PROPOFOL N/A 05/30/2016   Procedure: COLONOSCOPY WITH PROPOFOL;  Surgeon: Lollie Sails, MD;  Location: [facility] ENDOSCOPY;  Service: Endoscopy;  Laterality: N/A;  . COLOSTOMY Left 12/07/2016   Procedure: COLOSTOMY;  Surgeon: Clayburn Pert, MD;  Location: [facility] ORS;  Service: General;  Laterality: Left;  . ESOPHAGOGASTRODUODENOSCOPY    . ESOPHAGOGASTRODUODENOSCOPY N/A 05/30/2016   Procedure: ESOPHAGOGASTRODUODENOSCOPY (EGD);   Surgeon: Lollie Sails, MD;  Location: [facility] ENDOSCOPY;  Service: Endoscopy;  Laterality: N/A;  . FRACTURE SURGERY Bilateral    right arm and left wrist  . INCISION AND DRAINAGE ABSCESS N/A 12/07/2016   Procedure: DRAINAGE  OF INTRA ABDOMINAL ABSCESS;  Surgeon: Clayburn Pert, MD;  Location: [facility] ORS;  Service: General;  Laterality: N/A;  . KNEE ARTHROSCOPY    . LAPAROTOMY N/A 12/07/2016   Procedure: EXPLORATORY LAPAROTOMY;  Surgeon: Clayburn Pert, MD;  Location: [facility] ORS;  Service: General;  Laterality: N/A;  . PROSTATE SURGERY     Microwave therapy  . REPLACEMENT TOTAL KNEE Right 07/2016  . TONSILLECTOMY    . TOTAL KNEE ARTHROPLASTY Right 07/27/2016   Procedure: TOTAL KNEE ARTHROPLASTY;  Surgeon: [name], MD;  Location: [facility] ORS;  Service: Orthopedics;  Laterality: Right;  Dr. Erlene Quan had to place Urinary catheter due to prostate cancer history.  Using flexible scope.    Family History  Problem Relation Age of Onset  . Lung cancer Father   . Bipolar disorder Son   . Sudden death Son        due to drug over dose and cardiac issues  . Prostate cancer Neg Hx   . Bladder Cancer Neg Hx     Social History:  reports that he quit smoking about 28 years ago. He quit after 27.00 years of use. His smokeless tobacco use includes Chew. He reports that he drinks about 0.6 oz of alcohol per week . He reports  that he does not use drugs.  Allergies:  Allergies  Allergen Reactions  . Augmentin [Amoxicillin-Pot Clavulanate] Other (See Comments)    Abdominal upset  . Formaldehyde Rash and Other (See Comments)  . Tape Rash    Medications reviewed.    ROS a multipoint review of systems was completed, all pertinent positives and negatives are documented within the history of present illness and the remainder are negative  BP (!) 155/90   Pulse 78   Temp (!) 97.3 F (36.3 C) (Oral)   Wt 88.9 kg (196 lb)   BMI 28.94 kg/m   Physical Exam Gen.: No acute distress Chest: Clear to  auscultation  heart: Regular rate and rhythm Abdomen: Soft, nontender, nondistended. Well-healed midline incision. End colostomy present in the left lower quadrant that is pink, patent, productive of gas and stool. There is excoriation of the skin around it.    No results found for this or any previous visit (from the past 48 hour(s)). No results found.  Assessment/Plan:  1. Aftercare following surgery 76 year old male 1 month status post sigmoid colectomy for perforated diverticulitis. Clinically doing well with the exception of the skin around his ostomy. Discussed the appropriate timeline for a reversal given his recent virtual colonoscopy. Tentatively plan for reversal on October 30. We will see him in clinic 2 weeks prior to this for a repeat exam and to update his H&P. All questions answered to the patient's satisfaction.     Clayburn Pert, MD FACS General Surgeon  01/12/2017,4:07 PM

## 2017-01-12 NOTE — Patient Instructions (Addendum)
Please look at your blue sheet in case you have any questions or concerns.  We have spoken today about reversing your Ostomy. You are requesting to have this done.  We will arrange this to be done on 02/14/2017 by Dr. Adonis Huguenin at [facility].   Plan on being in the hospital between 5-7 days after surgery. You will be started on a liquid diet and then advanced as tolerated prior to going home.  If you have any disability or FMLA paperwork that needs to be filled out for your employer, please bring this in prior to surgery and it will be filled out upon your discharge from the hospital. We can give you a note anticipating your surgery date. If your employer is in need of this, please let us know.  To prep for your surgery, You will need to complete a bowel prep, 2 antibiotics, and a fleets enema prior to surgery. Your antibiotics are Neomycin and Erythromycin and these will be taken at 8am, 2pm, 8pm on the day of your prep. (Please see the Bowel sheet provided)  Please see your [facility]) Pre-Care Sheet for more information. If you have any questions, please call our office and ask for a nurse.  End Colostomy Reversal An end colostomy reversal is surgery that reverses an end colostomy. The large intestine is disconnected from the opening in the abdomen (stoma). Then it is reconnected to the large intestine inside the body. A stoma and pouch are no longer needed. Bowel movements can resume through the rectum. LET [facility] CARE PROVIDER KNOW ABOUT:  Allergies to food or medicine.  Medicines taken, including vitamins, health supplements, herbs, eye drops, over-the-counter medicines, and creams.  Use of steroids (by mouth or creams).  Previous problems with anesthetics or numbing medicines.  History of bleeding problems or blood clots.  Previous surgery.  Other health problems, including diabetes and kidney problems.  Possibility of pregnancy, if this applies. RISKS AND COMPLICATIONS General  surgical complications may include the following:  Reaction to anesthetics.  Damage to surrounding nerves, tissues, or structures.  Blood clot.  Bleeding.  Scarring. Specific risks for colostomy reversal, while rare, may include:  Intestinal paralysis (ileus). This is a normal part of recovery. It usually goes away in 3-7 days. However, it can last longer in some people.  Leaking at the joined part of the intestine (anastomotic leak).  Infection of the surgical cut (incision) or the place where the stoma was located.  A collection of pus (abscess) in the abdomen or pelvis.  Intestinal blockage.  Narrowing at the joined part of the intestine (stricture).  Urinary and sexual dysfunction. BEFORE THE PROCEDURE It is important to follow your health care provider's instructions prior to your procedure. This will help you to avoid complications. Steps before your procedure may include:  A physical exam, rectal exam, X-rays, colonoscopy, and other procedures.  Chemotherapy or radiation therapy, if the stoma was created due to cancer.  A review of the procedure, the anesthetic being used, and what to expect after the procedure. You may be asked to:  Stop taking certain medicines for several days prior to your procedure. These may include blood thinners (such as aspirin).  Take certain medicines, such as antibiotics or stool softeners.  Avoid eating and drinking after midnight the night before the procedure. This will help you to avoid complications from the anesthetic.  Quit smoking. Smoking increases the chances of a healing problem after your procedure. PROCEDURE You will be given medicine that  makes you sleep (general anesthetic). The procedure may be done as open surgery, with a large incision. It may also be done as laparoscopic surgery, with several smaller incisions. The surgeon will stitch or staple the intestine ends back together. This surgery takes several hours. AFTER  THE PROCEDURE  You will be given pain medicine.  Slowly increase your diet and movement as directed by your health care provider.  You should arrange for someone to help you with activities at home while you recover.   This information is not intended to replace advice given to you by your health care provider. Make sure you discuss any questions you have with your health care provider.   Document Released: 06/27/2011 Document Revised: 08/19/2014 Document Reviewed: 06/27/2011 Elsevier Interactive Patient Education [employer].

## 2017-01-13 DIAGNOSIS — I10 Essential (primary) hypertension: Secondary | ICD-10-CM | POA: Diagnosis not present

## 2017-01-13 DIAGNOSIS — F329 Major depressive disorder, single episode, unspecified: Secondary | ICD-10-CM | POA: Diagnosis not present

## 2017-01-13 DIAGNOSIS — K5720 Diverticulitis of large intestine with perforation and abscess without bleeding: Secondary | ICD-10-CM | POA: Diagnosis not present

## 2017-01-13 DIAGNOSIS — Z433 Encounter for attention to colostomy: Secondary | ICD-10-CM | POA: Diagnosis not present

## 2017-01-13 DIAGNOSIS — Z48815 Encounter for surgical aftercare following surgery on the digestive system: Secondary | ICD-10-CM | POA: Diagnosis not present

## 2017-01-13 DIAGNOSIS — K219 Gastro-esophageal reflux disease without esophagitis: Secondary | ICD-10-CM | POA: Diagnosis not present

## 2017-01-13 NOTE — Progress Notes (Signed)
01/12/17 [id number] WOC Nurse ostomy follow up [place] nurse team has been asked to meet this patient today in the surgeons office to address peristomal skin irritation and pouching failure.  The patient and his wife present today and are visible upset. The patient has provided me with a written narrative of his feeling.  He feels that his HHRN has not been helpful.  Stoma type/location: LLQ, end colostomy Stomal assessment/size: 1"pink, moist, budded, however the os of his stoma points directly at 6 o'clock and is skin level Peristomal assessment: denudation circumferentially that extends 4cm with small patch of satellite lesions at the distal edge of the skin irritation. This area is weeping, mostly along the area between 3-9 o'clock  Treatment options for stomal/peristomal skin: instructed patient and wife on how to use ostomy powder to crust the raw open skin, applying and brushing away the excess, up to 3 layers and using just tap water to crust with the powder Output brown, thin stool  Mr. Carberry was using a 2pc CTF pouch, however it is very apparent that this is not working for the patient with the level of skin irritation noted today Ostomy pouching:today I have placed the patient in a one piece flexible convex pouch along with 1/2 of a two inch barrier ring over the peristomal skin from 3-9 o'clock to create some additional convexity there at the os of his stoma. I have also added an ostomy belt and demonstrated how to place the belt, with two fingers under the belt to gauge the fit/tightness. He verbalized understanding.      Education provided: Explained rationale for the reason why his pouching systems are leaking.  Taught crusting for skin denudation, Explained use of [employer] lubrication drops and provided samples (they have been adding baking soda to his pouch, "learned on You Tube").   Contacted patient's [facility] agency and discussed today's care with Rudi Coco [facility] with [facility]. She will assist  with POC at home and reach out to the [facility] on the updated pouching suggestions.  She will follow up on making sure patient gets the correct pouches to continue to work with in order to resolve his pouching failure.  She also contacted [place] program for this [place] (I was driving between campuses) to make sure patient gets some new samples for use.  [place] nurse will follow up with patient by phone on Friday 01/13/17.     Re consult if needed Thanks  Moe Graca [facility] MSN, RN,CWOCN, CNS, CWON-AP [phone])

## 2017-01-13 NOTE — Telephone Encounter (Signed)
Caryn Section, RN and asked how Mr. Kanode was doing when she went to visit him and help with his ostomy bag. She stated that she had spoken to our ostomy nurse and gave her some ideas on what would work for the patient. They tried what was recommended and see how things go from here. Mardene Celeste goes back to see the patient on Wednesday 01/18/2017. Mardene Celeste stated that she told the patient to give her a call in case things do not work out and she would gladly coach him on what else to try.

## 2017-01-13 NOTE — Telephone Encounter (Signed)
Patient saw the ostomy nurse yesterday and was able to help the patient with his ostomy bag. However, this morning patient called me back to let me know that this morning at 4:00 AM he woke up to use the restroom and was full of stool everywhere because his ostomy bag had detached. Patient wanted to explain why this had happened. I told him that I was not sure what had happened. I did mention to him that I was going to get in touch with the ostomy nurse or his home health nurse so she could contact his ostomy nurse for questions. Patient agreed.  Patient gave me his Home health Nurse name and phone number for me to call her. Mardene Celeste [phone]

## 2017-01-15 ENCOUNTER — Encounter: Payer: Self-pay | Admitting: General Surgery

## 2017-01-15 ENCOUNTER — Encounter: Payer: Self-pay | Admitting: Family Medicine

## 2017-01-16 ENCOUNTER — Telehealth: Payer: Self-pay | Admitting: General Surgery

## 2017-01-16 NOTE — Telephone Encounter (Signed)
Pt advised of pre op date/time and sx date. Sx: 02/14/17 with Dr Jackey Loge assisting-Laparoscopic colostomy reversal, possible open.  Pre op: 02/03/17 @ 9:00am--Office.   Patient made aware to call [phone], between 1-3:00pm the day before surgery, to find out what time to arrive.

## 2017-01-17 ENCOUNTER — Other Ambulatory Visit: Payer: Self-pay | Admitting: Family Medicine

## 2017-01-17 DIAGNOSIS — K5720 Diverticulitis of large intestine with perforation and abscess without bleeding: Secondary | ICD-10-CM | POA: Diagnosis not present

## 2017-01-17 DIAGNOSIS — I10 Essential (primary) hypertension: Secondary | ICD-10-CM | POA: Diagnosis not present

## 2017-01-17 DIAGNOSIS — K219 Gastro-esophageal reflux disease without esophagitis: Secondary | ICD-10-CM | POA: Diagnosis not present

## 2017-01-17 DIAGNOSIS — F329 Major depressive disorder, single episode, unspecified: Secondary | ICD-10-CM | POA: Diagnosis not present

## 2017-01-17 DIAGNOSIS — Z48815 Encounter for surgical aftercare following surgery on the digestive system: Secondary | ICD-10-CM | POA: Diagnosis not present

## 2017-01-17 DIAGNOSIS — Z433 Encounter for attention to colostomy: Secondary | ICD-10-CM | POA: Diagnosis not present

## 2017-01-17 MED ORDER — SERTRALINE HCL 100 MG PO TABS: 100.0000 mg | ORAL_TABLET | Freq: Every day | ORAL | 1 refills | Status: DC

## 2017-01-17 NOTE — Telephone Encounter (Signed)
Pt called back responding to Dr Caryl Bis email regarding filling the Rx. Pt states he would like for Dr Nolen Mu to fill the Rx. Thank you!

## 2017-01-18 ENCOUNTER — Telehealth: Payer: Self-pay

## 2017-01-18 NOTE — Telephone Encounter (Signed)
Patient called stating that his Erythromycin was not covered and was going to cost him $125. He wanted to know if there was anything else he could use instead of the Erythromycin.   Patient also wanted to know what was Dr. Reginal Lutes response from the question he had on Monday. I told him that I would ask Dr. Adonis Huguenin tomorrow morning when he came in. I would call him back with an answer. Patient understood and had no further questions.

## 2017-01-19 MED ORDER — METRONIDAZOLE 500 MG PO TABS: 500.0000 mg | ORAL_TABLET | Freq: Three times a day (TID) | ORAL | 0 refills | Status: DC

## 2017-01-19 NOTE — Telephone Encounter (Signed)
Called patient back since he had left a voicemail. Patient wanted to know if I had sent his new prescription. I told him that I had and that it was called Flagyl. Patient understood and had no further questions.

## 2017-01-19 NOTE — Telephone Encounter (Signed)
Spoke with Dr. Adonis Huguenin and asked what antibiotic we are able to replace for patient's Erythromycin. He stated that he could use Flagyl 500 MG 1 tab three times the day before his surgery. I will e-scribe his medication.  I will call the patient to let him know.  Dr. Adonis Huguenin stated that it was okay for him to have his surgery before the 3 month mark. This information was called in to the patient. He stated that he understood. He also stated that he had some questions for me about his colon prep. However, he was on his way to [place] so therefore, he will call me when he gets home.

## 2017-01-20 ENCOUNTER — Encounter: Payer: Self-pay | Admitting: General Surgery

## 2017-01-20 DIAGNOSIS — K5720 Diverticulitis of large intestine with perforation and abscess without bleeding: Secondary | ICD-10-CM | POA: Diagnosis not present

## 2017-01-20 DIAGNOSIS — F329 Major depressive disorder, single episode, unspecified: Secondary | ICD-10-CM | POA: Diagnosis not present

## 2017-01-20 DIAGNOSIS — K219 Gastro-esophageal reflux disease without esophagitis: Secondary | ICD-10-CM | POA: Diagnosis not present

## 2017-01-20 DIAGNOSIS — Z433 Encounter for attention to colostomy: Secondary | ICD-10-CM | POA: Diagnosis not present

## 2017-01-20 DIAGNOSIS — I10 Essential (primary) hypertension: Secondary | ICD-10-CM | POA: Diagnosis not present

## 2017-01-20 DIAGNOSIS — Z48815 Encounter for surgical aftercare following surgery on the digestive system: Secondary | ICD-10-CM | POA: Diagnosis not present

## 2017-01-24 DIAGNOSIS — I10 Essential (primary) hypertension: Secondary | ICD-10-CM | POA: Diagnosis not present

## 2017-01-24 DIAGNOSIS — K5720 Diverticulitis of large intestine with perforation and abscess without bleeding: Secondary | ICD-10-CM | POA: Diagnosis not present

## 2017-01-24 DIAGNOSIS — Z48815 Encounter for surgical aftercare following surgery on the digestive system: Secondary | ICD-10-CM | POA: Diagnosis not present

## 2017-01-24 DIAGNOSIS — F329 Major depressive disorder, single episode, unspecified: Secondary | ICD-10-CM | POA: Diagnosis not present

## 2017-01-24 DIAGNOSIS — K219 Gastro-esophageal reflux disease without esophagitis: Secondary | ICD-10-CM | POA: Diagnosis not present

## 2017-01-24 DIAGNOSIS — Z433 Encounter for attention to colostomy: Secondary | ICD-10-CM | POA: Diagnosis not present

## 2017-01-25 DIAGNOSIS — K5720 Diverticulitis of large intestine with perforation and abscess without bleeding: Secondary | ICD-10-CM | POA: Diagnosis not present

## 2017-01-25 DIAGNOSIS — Z433 Encounter for attention to colostomy: Secondary | ICD-10-CM | POA: Diagnosis not present

## 2017-01-25 DIAGNOSIS — K219 Gastro-esophageal reflux disease without esophagitis: Secondary | ICD-10-CM | POA: Diagnosis not present

## 2017-01-25 DIAGNOSIS — F329 Major depressive disorder, single episode, unspecified: Secondary | ICD-10-CM | POA: Diagnosis not present

## 2017-01-25 DIAGNOSIS — I10 Essential (primary) hypertension: Secondary | ICD-10-CM | POA: Diagnosis not present

## 2017-01-25 DIAGNOSIS — Z48815 Encounter for surgical aftercare following surgery on the digestive system: Secondary | ICD-10-CM | POA: Diagnosis not present

## 2017-01-26 ENCOUNTER — Ambulatory Visit (INDEPENDENT_AMBULATORY_CARE_PROVIDER_SITE_OTHER): Payer: Medicare Other

## 2017-01-26 ENCOUNTER — Other Ambulatory Visit: Payer: Self-pay | Admitting: Family Medicine

## 2017-01-26 DIAGNOSIS — Z23 Encounter for immunization: Secondary | ICD-10-CM | POA: Diagnosis not present

## 2017-01-29 ENCOUNTER — Encounter: Payer: Self-pay | Admitting: General Surgery

## 2017-01-31 DIAGNOSIS — F329 Major depressive disorder, single episode, unspecified: Secondary | ICD-10-CM | POA: Diagnosis not present

## 2017-01-31 DIAGNOSIS — I10 Essential (primary) hypertension: Secondary | ICD-10-CM | POA: Diagnosis not present

## 2017-01-31 DIAGNOSIS — K5720 Diverticulitis of large intestine with perforation and abscess without bleeding: Secondary | ICD-10-CM | POA: Diagnosis not present

## 2017-01-31 DIAGNOSIS — Z433 Encounter for attention to colostomy: Secondary | ICD-10-CM | POA: Diagnosis not present

## 2017-01-31 DIAGNOSIS — Z48815 Encounter for surgical aftercare following surgery on the digestive system: Secondary | ICD-10-CM | POA: Diagnosis not present

## 2017-01-31 DIAGNOSIS — K219 Gastro-esophageal reflux disease without esophagitis: Secondary | ICD-10-CM | POA: Diagnosis not present

## 2017-01-31 NOTE — Telephone Encounter (Signed)
Error

## 2017-02-01 ENCOUNTER — Encounter: Payer: Self-pay | Admitting: General Surgery

## 2017-02-02 ENCOUNTER — Ambulatory Visit (INDEPENDENT_AMBULATORY_CARE_PROVIDER_SITE_OTHER): Payer: Medicare Other | Admitting: General Surgery

## 2017-02-02 ENCOUNTER — Encounter: Payer: Self-pay | Admitting: General Surgery

## 2017-02-02 VITALS — BP 143/83 | HR 91 | Temp 98.2°F | Ht 69.0 in | Wt 194.2 lb

## 2017-02-02 DIAGNOSIS — Z4889 Encounter for other specified surgical aftercare: Secondary | ICD-10-CM

## 2017-02-02 NOTE — Progress Notes (Signed)
Outpatient Surgical Follow Up  02/02/2017  Cameron Foster. is an 76 y.o. male.   Chief Complaint  Patient presents with  . Routine Post Op    Laparotomy with Sigmoid Colectomy, Hartman's, and Drainage of Intra-abdominal Abscess (12/07/16)- Dr. Harold Barban reversal (Scheduled 02/14/17)    HPI: 76 year old male returns to clinic now approximately 7 weeks status post Hartman's procedure for perforated diverticulitis. Here to discuss reversal. States since his last visit he's had much more success with his ostomy appliance and has not had any troubles with leaking or other complaints. His pain is well controlled except for the occasional twinge of pain in his right lower quadrant near the site of his prior drain. He denies any fevers, chills, nausea, vomiting, chest pain, shortness of breath. He does report he has had some weight loss since the initial surgery but has stabilized.  Past Medical History:  Diagnosis Date  . Alcoholism ([place])   . Arrhythmia   . Arthritis   . Asthma   . Atrial fibrillation ([place])    one episode  . Colon polyp   . Depression   . Diverticulitis   . Diverticulosis 30 years  . Fainting   . GERD (gastroesophageal reflux disease)   . History of blood clots   . Hyperlipidemia   . Hypertension   . Irritable bowel syndrome   . Prostate cancer ([place]) 2012   treated with radiation therapy.  . Vasovagal syncope   . Vertigo     Past Surgical History:  Procedure Laterality Date  . CARDIAC CATHETERIZATION     [place] no stents  . CATARACT EXTRACTION, BILATERAL    . COLON RESECTION SIGMOID N/A 12/07/2016   Procedure: COLON RESECTION SIGMOID;  Surgeon: Clayburn Pert, MD;  Location: [facility] ORS;  Service: General;  Laterality: N/A;  . COLONOSCOPY  2016  . COLONOSCOPY WITH PROPOFOL N/A 05/30/2016   Procedure: COLONOSCOPY WITH PROPOFOL;  Surgeon: Lollie Sails, MD;  [facility] ENDOSCOPY;  Service: Endoscopy;  Laterality: N/A;  . COLOSTOMY Left  12/07/2016   Procedure: COLOSTOMY;  Surgeon: Clayburn Pert, MD;  Location: [facility] ORS;  Service: General;  Laterality: Left;  . ESOPHAGOGASTRODUODENOSCOPY    . ESOPHAGOGASTRODUODENOSCOPY N/A 05/30/2016   Procedure: ESOPHAGOGASTRODUODENOSCOPY (EGD);  Surgeon: Lollie Sails, MD;  Location: [facility] ENDOSCOPY;  Service: Endoscopy;  Laterality: N/A;  . FRACTURE SURGERY Bilateral    right arm and left wrist  . INCISION AND DRAINAGE ABSCESS N/A 12/07/2016   Procedure: DRAINAGE  OF INTRA ABDOMINAL ABSCESS;  Surgeon: Clayburn Pert, MD;  Location: [facility] ORS;  Service: General;  Laterality: N/A;  . KNEE ARTHROSCOPY    . LAPAROTOMY N/A 12/07/2016   Procedure: EXPLORATORY LAPAROTOMY;  Surgeon: Clayburn Pert, MD;  Location: [facility] ORS;  Service: General;  Laterality: N/A;  . PROSTATE SURGERY     Microwave therapy  . REPLACEMENT TOTAL KNEE Right 07/2016  . TONSILLECTOMY    . TOTAL KNEE ARTHROPLASTY Right 07/27/2016   Procedure: TOTAL KNEE ARTHROPLASTY;  Surgeon: [name], MD;  Location: [facility] ORS;  Service: Orthopedics;  Laterality: Right;  Dr. Erlene Quan had to place Urinary catheter due to prostate cancer history.  Using flexible scope.    Family History  Problem Relation Age of Onset  . Lung cancer Father   . Bipolar disorder Son   . Sudden death Son        due to drug over dose and cardiac issues  . Prostate cancer Neg Hx   . Bladder Cancer Neg Hx  Social History:  reports that he quit smoking about 28 years ago. He quit after 27.00 years of use. His smokeless tobacco use includes Chew. He reports that he drinks about 0.6 oz of alcohol per week . He reports that he does not use drugs.  Allergies:  Allergies  Allergen Reactions  . Augmentin [Amoxicillin-Pot Clavulanate] Other (See Comments)    Abdominal upset  . Formaldehyde Rash  . Tape Rash    Medications reviewed.    ROS A multipoint review of systems was completed, all pertinent positives and negatives are documented within  the history of present illness and remainder are negative.   BP (!) 143/83   Pulse 91   Temp 98.2 F (36.8 C) (Oral)   Ht 5' 9"  (1.753 m)   Wt 88.1 kg (194 lb 3.2 oz)   BMI 28.68 kg/m   Physical Exam Gen.: No acute distress Chest: Clear to auscultation Heart: Regular rate and rhythm Abdomen: Soft, nontender, nondistended. Well healed midline incision. In the colostomy in the left lower quadrant that is pink, patent, productive of gas and stool.    No results found for this or any previous visit (from the past 48 hour(s)). No results found.  Assessment/Plan:  1. Aftercare following surgery 76 year old male with an end colostomy. Originally scheduled for reversal on October 30 but patient decided he wants to wait an additional 2 weeks prior to surgery. Discussed the procedure in detail. The week he wishes to have surgery as when I'm on my call but I discussed doing it as a first case on Monday with one of my partners and my being the assistant. He agrees with this plan. I'll have him return to clinic on November 1 to meet my partner with planned surgery on Monday, November 12.     Clayburn Pert, MD FACS General Surgeon  02/02/2017,2:48 PM

## 2017-02-02 NOTE — Patient Instructions (Signed)
We have scheduled you an appointment as listed below:   We have spoken today about reversing your Ostomy. You are requesting to have this done.  We will arrange this to be done on 11/12 by Dr. Hampton Abbot assisted by Dr. Adonis Huguenin at [facility].   Plan on being in the hospital between 5-7 days after surgery. You will be started on a liquid diet and then advanced as tolerated prior to going home.  If you have any disability or FMLA paperwork that needs to be filled out for your employer, please bring this in prior to surgery and it will be filled out upon your discharge from the hospital. We can give you a note anticipating your surgery date. If your employer is in need of this, please let us know.  To prep for your surgery, You will need to complete a bowel prep, 2 antibiotics, and a fleets enema prior to surgery. Your antibiotics are Neomycin and Erythromycin and these will be taken at 8am, 2pm, 8pm on the day of your prep. (Please see the Bowel sheet provided)  Please see your [facility]) Pre-Care Sheet for more information. If you have any questions, please call our office and ask for a nurse.  End Colostomy Reversal An end colostomy reversal is surgery that reverses an end colostomy. The large intestine is disconnected from the opening in the abdomen (stoma). Then it is reconnected to the large intestine inside the body. A stoma and pouch are no longer needed. Bowel movements can resume through the rectum. LET [facility] CARE PROVIDER KNOW ABOUT:  Allergies to food or medicine.  Medicines taken, including vitamins, health supplements, herbs, eye drops, over-the-counter medicines, and creams.  Use of steroids (by mouth or creams).  Previous problems with anesthetics or numbing medicines.  History of bleeding problems or blood clots.  Previous surgery.  Other health problems, including diabetes and kidney problems.  Possibility of pregnancy, if this applies. RISKS AND COMPLICATIONS General  surgical complications may include the following:  Reaction to anesthetics.  Damage to surrounding nerves, tissues, or structures.  Blood clot.  Bleeding.  Scarring. Specific risks for colostomy reversal, while rare, may include:  Intestinal paralysis (ileus). This is a normal part of recovery. It usually goes away in 3-7 days. However, it can last longer in some people.  Leaking at the joined part of the intestine (anastomotic leak).  Infection of the surgical cut (incision) or the place where the stoma was located.  A collection of pus (abscess) in the abdomen or pelvis.  Intestinal blockage.  Narrowing at the joined part of the intestine (stricture).  Urinary and sexual dysfunction. BEFORE THE PROCEDURE It is important to follow your health care provider's instructions prior to your procedure. This will help you to avoid complications. Steps before your procedure may include:  A physical exam, rectal exam, X-rays, colonoscopy, and other procedures.  Chemotherapy or radiation therapy, if the stoma was created due to cancer.  A review of the procedure, the anesthetic being used, and what to expect after the procedure. You may be asked to:  Stop taking certain medicines for several days prior to your procedure. These may include blood thinners (such as aspirin).  Take certain medicines, such as antibiotics or stool softeners.  Avoid eating and drinking after midnight the night before the procedure. This will help you to avoid complications from the anesthetic.  Quit smoking. Smoking increases the chances of a healing problem after your procedure. PROCEDURE You will be given medicine that  makes you sleep (general anesthetic). The procedure may be done as open surgery, with a large incision. It may also be done as laparoscopic surgery, with several smaller incisions. The surgeon will stitch or staple the intestine ends back together. This surgery takes several hours. AFTER  THE PROCEDURE  You will be given pain medicine.  Slowly increase your diet and movement as directed by your health care provider.  You should arrange for someone to help you with activities at home while you recover.   This information is not intended to replace advice given to you by your health care provider. Make sure you discuss any questions you have with your health care provider.   Document Released: 06/27/2011 Document Revised: 08/19/2014 Document Reviewed: 06/27/2011 Elsevier Interactive Patient Education [employer].

## 2017-02-03 ENCOUNTER — Inpatient Hospital Stay: Admission: RE | Admit: 2017-02-03 | Payer: Medicare Other | Source: Ambulatory Visit

## 2017-02-06 ENCOUNTER — Telehealth: Payer: Self-pay | Admitting: Surgery

## 2017-02-06 ENCOUNTER — Telehealth: Payer: Self-pay

## 2017-02-06 DIAGNOSIS — Z433 Encounter for attention to colostomy: Secondary | ICD-10-CM | POA: Diagnosis not present

## 2017-02-06 DIAGNOSIS — I10 Essential (primary) hypertension: Secondary | ICD-10-CM | POA: Diagnosis not present

## 2017-02-06 DIAGNOSIS — K5720 Diverticulitis of large intestine with perforation and abscess without bleeding: Secondary | ICD-10-CM | POA: Diagnosis not present

## 2017-02-06 DIAGNOSIS — F329 Major depressive disorder, single episode, unspecified: Secondary | ICD-10-CM | POA: Diagnosis not present

## 2017-02-06 DIAGNOSIS — K219 Gastro-esophageal reflux disease without esophagitis: Secondary | ICD-10-CM | POA: Diagnosis not present

## 2017-02-06 DIAGNOSIS — Z48815 Encounter for surgical aftercare following surgery on the digestive system: Secondary | ICD-10-CM | POA: Diagnosis not present

## 2017-02-06 NOTE — Telephone Encounter (Signed)
Mardene Celeste from [facility] called wanting a verbal order for [place] to extend his visits x 3. Verbal order was given.

## 2017-02-06 NOTE — Telephone Encounter (Signed)
I called patient this evening and he describes that he is having no real stool output just mucus.  He feels constipated.  I inquired about fluid intake and he only drinks 20 ounces of water a day.  (2 -10 oz bottles)  He has just taken Magnesium citrate and is to text or call me with the effectiveness of this.  We discussed that in addition to increasing fluids, he needs to consume more fiber.  If this does not relieve his symptoms, he is to call MD and notify them.  We may need to use a suppository or even an enema.  He does not feel firmness in the abdomen or discomfort at this time.  Will follow up.  Domenic Moras BSN RN [place]  Pager [phone]

## 2017-02-06 NOTE — Telephone Encounter (Signed)
Pt advised of pre op date/time and sx date. Sx: 02/27/17 with Dr Hampton Abbot, Dr Adonis Huguenin assisting--Laparoscopic colostomy reversal, possible open.  Pre op: 02/20/17 @ 1:00pm--Office interview.   Patient made aware to call [phone], between 1-3:00pm the day before surgery, to find out what time to arrive.    Patient states that his ostomy bag has came lose 2 times within 24 hours. He has changed it each time. He now states that mucus is coming out from the sides of the bag and from the rectum. I have spoke to [name], Therapist, sports about this. She stated that it is normal for the mucus to come from the rectum and the reason the mucus is coming from around the bag is because the bag is not sealed good. Patient states that he has called [name] is awaiting a phone call back to see if a nurse can come and help with sealing the bag. He will call back if he needs further help.

## 2017-02-06 NOTE — Telephone Encounter (Signed)
Patient called in and states that he is not having bowel movements from his ostomy, he is only having mucous come out of this area since last night. He will get a seal on this area and then it will begin leaking mucous around the ostomy site, not in the bag. Denies abdominal pain. Denies Nausea/vomiting. Denies fever/chills. Patient is extremely frustrated with getting the ostomy appliance to seal and leakage. He states that he has tried to call Domenic Moras without any luck today. He also had Home Care nurse come out at 1300 today, replaced the bag, and it began leaking again at 1530. He is requesting to speak with Domenic Moras at this time.  He is currently using a Hollister 1 piece system 42m per patient.  Will speak with Surgeon on call as well as KDomenic Moras ostomy nurse and return phone call to patient.

## 2017-02-07 NOTE — Telephone Encounter (Signed)
Spoke with patient on phone yesterday afternoon at 5:05pm after speaking with Dr. Rosana Hoes. Patient was instructed to Minimize use of narcotics, continue to take stool softeners BID, begin taking Miralax 17g daily beginning tomorrow, take 1 bottle of magnesium citrate tonight to produce a bowel movement, and if still constipated in 2 days- he was instructed to do a fleets enema through the ostomy to help alleviate any stool burden. Hopefully, doing this regimen patient will alleviate constipation and have a better seal around ostomy as this should allow mucous to combine with stool instead of only mucous coming out around appliance. He was also instructed to increase water intake to 72 ounces daily to help miralax work. He will call back with any further questions or concerns prior to his next appointment. He verbalizes understanding of conversation.

## 2017-02-09 ENCOUNTER — Telehealth: Payer: Self-pay | Admitting: General Surgery

## 2017-02-09 DIAGNOSIS — Z48815 Encounter for surgical aftercare following surgery on the digestive system: Secondary | ICD-10-CM | POA: Diagnosis not present

## 2017-02-09 DIAGNOSIS — K219 Gastro-esophageal reflux disease without esophagitis: Secondary | ICD-10-CM | POA: Diagnosis not present

## 2017-02-09 DIAGNOSIS — F329 Major depressive disorder, single episode, unspecified: Secondary | ICD-10-CM | POA: Diagnosis not present

## 2017-02-09 DIAGNOSIS — K5720 Diverticulitis of large intestine with perforation and abscess without bleeding: Secondary | ICD-10-CM | POA: Diagnosis not present

## 2017-02-09 DIAGNOSIS — Z433 Encounter for attention to colostomy: Secondary | ICD-10-CM | POA: Diagnosis not present

## 2017-02-09 DIAGNOSIS — I10 Essential (primary) hypertension: Secondary | ICD-10-CM | POA: Diagnosis not present

## 2017-02-09 NOTE — Telephone Encounter (Signed)
Returned phone call at this time to patient. He states that he is "raw" around his anus and has only tried some vaseline to this area. I recommended A&D ointment with zinc and that he would need to clean area, dry thoroughly, and apply ointment 3-4 times daily. Encouraged to call back with any other questions or concerns. He verbalizes understanding and readback all instructions.

## 2017-02-09 NOTE — Telephone Encounter (Signed)
Patient has called and states that he needs ointment or powder for a rash (irritation) on anus--itchy and burning. The rash has been there for about 2-3 weeks and says it is getting worse.   Please call back at the number verified in the chart.

## 2017-02-13 ENCOUNTER — Encounter: Payer: Self-pay | Admitting: General Surgery

## 2017-02-13 ENCOUNTER — Telehealth: Payer: Self-pay | Admitting: General Surgery

## 2017-02-13 DIAGNOSIS — F329 Major depressive disorder, single episode, unspecified: Secondary | ICD-10-CM | POA: Diagnosis not present

## 2017-02-13 DIAGNOSIS — Z433 Encounter for attention to colostomy: Secondary | ICD-10-CM | POA: Diagnosis not present

## 2017-02-13 DIAGNOSIS — I10 Essential (primary) hypertension: Secondary | ICD-10-CM | POA: Diagnosis not present

## 2017-02-13 DIAGNOSIS — Z48815 Encounter for surgical aftercare following surgery on the digestive system: Secondary | ICD-10-CM | POA: Diagnosis not present

## 2017-02-13 DIAGNOSIS — Z8546 Personal history of malignant neoplasm of prostate: Secondary | ICD-10-CM | POA: Diagnosis not present

## 2017-02-13 DIAGNOSIS — K589 Irritable bowel syndrome without diarrhea: Secondary | ICD-10-CM | POA: Diagnosis not present

## 2017-02-13 DIAGNOSIS — Z87891 Personal history of nicotine dependence: Secondary | ICD-10-CM | POA: Diagnosis not present

## 2017-02-13 DIAGNOSIS — K5720 Diverticulitis of large intestine with perforation and abscess without bleeding: Secondary | ICD-10-CM | POA: Diagnosis not present

## 2017-02-13 DIAGNOSIS — K219 Gastro-esophageal reflux disease without esophagitis: Secondary | ICD-10-CM | POA: Diagnosis not present

## 2017-02-13 DIAGNOSIS — Z96651 Presence of right artificial knee joint: Secondary | ICD-10-CM | POA: Diagnosis not present

## 2017-02-13 NOTE — Telephone Encounter (Signed)
Patient is calling asking if someone would give him a call, patient said he has a few questions he needs to ask the nurse regarding his post op appointment scheduled for 02/16/17. Please call patient and advice.

## 2017-02-13 NOTE — Telephone Encounter (Signed)
Called patient and went over his ostomy reversal prepping. Patient understood and had no further questions.

## 2017-02-14 DIAGNOSIS — Z433 Encounter for attention to colostomy: Secondary | ICD-10-CM | POA: Diagnosis not present

## 2017-02-14 DIAGNOSIS — K5720 Diverticulitis of large intestine with perforation and abscess without bleeding: Secondary | ICD-10-CM | POA: Diagnosis not present

## 2017-02-14 DIAGNOSIS — I10 Essential (primary) hypertension: Secondary | ICD-10-CM | POA: Diagnosis not present

## 2017-02-14 DIAGNOSIS — F329 Major depressive disorder, single episode, unspecified: Secondary | ICD-10-CM | POA: Diagnosis not present

## 2017-02-14 DIAGNOSIS — Z48815 Encounter for surgical aftercare following surgery on the digestive system: Secondary | ICD-10-CM | POA: Diagnosis not present

## 2017-02-14 DIAGNOSIS — K219 Gastro-esophageal reflux disease without esophagitis: Secondary | ICD-10-CM | POA: Diagnosis not present

## 2017-02-15 ENCOUNTER — Encounter: Payer: Self-pay | Admitting: Surgery

## 2017-02-16 ENCOUNTER — Encounter: Payer: Self-pay | Admitting: Surgery

## 2017-02-16 ENCOUNTER — Ambulatory Visit (INDEPENDENT_AMBULATORY_CARE_PROVIDER_SITE_OTHER): Payer: Medicare Other | Admitting: Surgery

## 2017-02-16 VITALS — BP 123/77 | HR 97 | Temp 97.9°F | Wt 193.0 lb

## 2017-02-16 DIAGNOSIS — Z48815 Encounter for surgical aftercare following surgery on the digestive system: Secondary | ICD-10-CM | POA: Diagnosis not present

## 2017-02-16 DIAGNOSIS — Z933 Colostomy status: Secondary | ICD-10-CM

## 2017-02-16 DIAGNOSIS — K219 Gastro-esophageal reflux disease without esophagitis: Secondary | ICD-10-CM | POA: Diagnosis not present

## 2017-02-16 DIAGNOSIS — F329 Major depressive disorder, single episode, unspecified: Secondary | ICD-10-CM | POA: Diagnosis not present

## 2017-02-16 DIAGNOSIS — I10 Essential (primary) hypertension: Secondary | ICD-10-CM | POA: Diagnosis not present

## 2017-02-16 DIAGNOSIS — K5720 Diverticulitis of large intestine with perforation and abscess without bleeding: Secondary | ICD-10-CM | POA: Diagnosis not present

## 2017-02-16 DIAGNOSIS — Z433 Encounter for attention to colostomy: Secondary | ICD-10-CM | POA: Diagnosis not present

## 2017-02-16 NOTE — Progress Notes (Signed)
02/16/2017  History of Present Illness: Cameron Foster. is a 76 y.o. male who presents prior to colostomy reversal.  He has a history of perforated diverticulitis that required a Hartmann's procedure with Dr. Adonis Huguenin on 8/22.  He has been doing well and his ostomy works well.  Denies any significant pain though has some episodes of discomfort depending on how he moves.  He is looking forward to having his ostomy reversed.  Past Medical History: Past Medical History:  Diagnosis Date  . Alcoholism ([place])   . Arrhythmia   . Arthritis   . Asthma   . Atrial fibrillation ([place])    one episode  . Colon polyp   . Depression   . Diverticulitis   . Diverticulosis 30 years  . Fainting   . GERD (gastroesophageal reflux disease)   . History of blood clots   . Hyperlipidemia   . Hypertension   . Irritable bowel syndrome   . Prostate cancer ([place]) 2012   treated with radiation therapy.  . Vasovagal syncope   . Vertigo      Past Surgical History: Past Surgical History:  Procedure Laterality Date  . CARDIAC CATHETERIZATION     [place] no stents  . CATARACT EXTRACTION, BILATERAL    . COLON RESECTION SIGMOID N/A 12/07/2016   Procedure: COLON RESECTION SIGMOID;  Surgeon: Clayburn Pert, MD;  Location: [facility] ORS;  Service: General;  Laterality: N/A;  . COLONOSCOPY  2016  . COLONOSCOPY WITH PROPOFOL N/A 05/30/2016   Procedure: COLONOSCOPY WITH PROPOFOL;  Surgeon: Lollie Sails, MD;  Location: [facility] ENDOSCOPY;  Service: Endoscopy;  Laterality: N/A;  . COLOSTOMY Left 12/07/2016   Procedure: COLOSTOMY;  Surgeon: Clayburn Pert, MD;  Location: [facility] ORS;  Service: General;  Laterality: Left;  . ESOPHAGOGASTRODUODENOSCOPY    . ESOPHAGOGASTRODUODENOSCOPY N/A 05/30/2016   Procedure: ESOPHAGOGASTRODUODENOSCOPY (EGD);  Surgeon: Lollie Sails, MD;  Location: [facility] ENDOSCOPY;  Service: Endoscopy;  Laterality: N/A;  . FRACTURE SURGERY Bilateral    right arm and left wrist  . INCISION AND  DRAINAGE ABSCESS N/A 12/07/2016   Procedure: DRAINAGE  OF INTRA ABDOMINAL ABSCESS;  Surgeon: Clayburn Pert, MD;  Location: [facility] ORS;  Service: General;  Laterality: N/A;  . KNEE ARTHROSCOPY    . LAPAROTOMY N/A 12/07/2016   Procedure: EXPLORATORY LAPAROTOMY;  Surgeon: Clayburn Pert, MD;  Location: [facility] ORS;  Service: General;  Laterality: N/A;  . PROSTATE SURGERY     Microwave therapy  . REPLACEMENT TOTAL KNEE Right 07/2016  . TONSILLECTOMY    . TOTAL KNEE ARTHROPLASTY Right 07/27/2016   Procedure: TOTAL KNEE ARTHROPLASTY;  Surgeon: [name], MD;  Location: [facility] ORS;  Service: Orthopedics;  Laterality: Right;  Dr. Erlene Quan had to place Urinary catheter due to prostate cancer history.  Using flexible scope.    Home Medications: Prior to Admission medications   Medication Sig Start Date End Date Taking? Authorizing Provider  amLODipine (NORVASC) 5 MG tablet Take 1 tablet (5 mg total) by mouth daily. Patient taking differently: Take 5 mg by mouth at bedtime.  02/10/16  Yes Leone Haven, MD  Aspirin-Acetaminophen-Caffeine (GOODY HEADACHE PO) Take 1 Package by mouth daily as needed (headaches).   Yes [provider]  docusate sodium (COLACE) 100 MG capsule Take 2 capsules (200 mg total) by mouth 2 (two) times daily. 12/23/16  Yes Carrie Mew, MD  fluticasone [facility]) 50 MCG/ACT nasal spray Place 1 spray into both nostrils daily as needed for allergies or rhinitis.   Yes [provider]  gabapentin (NEURONTIN) 400 MG capsule Take 400 mg by mouth at bedtime.   Yes [provider]  hydrocortisone 2.5 % cream APPLY  CREAM TO AFFECTED AREA TWICE DAILY AS NEEDED FOR  RASH 11/01/16  Yes Leone Haven, MD  lisinopril (PRINIVIL,ZESTRIL) 10 MG tablet TAKE ONE TABLET BY MOUTH ONCE DAILY 12/21/16  Yes Leone Haven, MD  liver oil-zinc oxide (DESITIN) 40 % ointment Apply 1 application topically daily as needed for irritation.   Yes [provider]   metoprolol succinate (TOPROL-XL) 25 MG 24 hr tablet Take 1 tablet (25 mg total) by mouth daily. 02/10/16  Yes Leone Haven, MD  metroNIDAZOLE (FLAGYL) 500 MG tablet Take 1 tablet (500 mg total) by mouth 3 (three) times daily. Take 1 tablet at 8:00 AM, Take 1 tablet at 2:00 PM and Take 1 tablet at 8:00 PM. 02/13/17  Yes Clayburn Pert, MD  omeprazole (PRILOSEC) 20 MG capsule Take 20 mg by mouth daily as needed.   Yes [provider]  polyethylene glycol (MIRALAX / GLYCOLAX) packet Take 17 g by mouth daily.   Yes [provider]  sertraline (ZOLOFT) 100 MG tablet Take 1 tablet (100 mg total) by mouth daily. 01/17/17  Yes Leone Haven, MD    Allergies: Allergies  Allergen Reactions  . Augmentin [Amoxicillin-Pot Clavulanate] Other (See Comments)    Abdominal upset  . Formaldehyde Rash  . Tape Rash    Review of Systems: Review of Systems  Constitutional: Negative for chills and fever.  Respiratory: Negative for shortness of breath.   Cardiovascular: Negative for chest pain.  Gastrointestinal: Negative for abdominal pain, constipation, diarrhea, nausea and vomiting.    Physical Exam BP 123/77   Pulse 97   Temp 97.9 F (36.6 C) (Oral)   Wt 87.5 kg (193 lb)   BMI 28.50 kg/m  CONSTITUTIONAL: No acute distress, well nourished. RESPIRATORY:  Lungs are clear, and breath sounds are equal bilaterally. Normal respiratory effort without pathologic use of accessory muscles. CARDIOVASCULAR: Heart is regular without murmurs, gallops, or rubs. GI: The abdomen is soft, nondistended, nontender to palpation.  Midline incision has healed well.  He has diastasis recti but no midline hernia.  His ostomy is viable and with stool in bag.  There is some surrounding erythema due to tape and appliance changes.  MUSCULOSKELETAL:  Normal muscle strength and tone in all four extremities.  No peripheral edema or cyanosis. NEUROLOGIC:  Motor and sensation is grossly normal.  Cranial  nerves are grossly intact. PSYCH:  Alert and oriented to person, place and time. Affect is normal.  Labs/Imaging: None recently  Assessment and Plan: This is a 76 y.o. male who presents for colostomy reversal on 11/12.  Discussed alongside with Dr. Adonis Huguenin that we would be doing the surgery together.  The plan would be to try doing his colostomy reversal via laparoscopy.  However, he does understand that if there are too many adhesions or there is any concern about his safety, we could proceed to the traditional open procedure.  We discussed as well the plan for primary stapled anastomosis and testing the new anastomosis with air insufflation.  If there is any concern with the anastomosis, we would also do a temporary loop ileostomy.  He would have a foley catheter for the first day and given his history of prostate cancer and difficulties with foley insertion, we will request Urology assistance for this.  At this point, we do not think he would need  ureteral stents to be placed.  Looking at his CT scan from 12/2016, there is still a short proximal segment of diverticular disease that we will resect during colon mobilization prior to our anastomosis.  Post-op expectations and length of stay were also discussed with the patient and his wife.  He has been given instructions on his bowel prep to drink an enemas to use the night before and day of surgery, as well as the preop antibiotics to take at home the day before.  Patient understands this plan and all of his questions have been answered.  Face-to-face time spent with the patient and care providers was 25 minutes, with more than 50% of the time spent counseling, educating, and coordinating care of the patient.     Melvyn Neth, [place]

## 2017-02-16 NOTE — Patient Instructions (Signed)
We have spoken today about reversing your Ostomy. You are requesting to have this done.  We will arrange this to be done on 02/27/2017 by Dr. Olean Ree and Dr. Clayburn Pert at [facility].   Plan on being in the hospital between 5-7 days after surgery. You will be started on a liquid diet and then advanced as tolerated prior to going home.  If you have any disability or FMLA paperwork that needs to be filled out for your employer, please bring this in prior to surgery and it will be filled out upon your discharge from the hospital. We can give you a note anticipating your surgery date. If your employer is in need of this, please let us know.  To prep for your surgery, You will need to complete a bowel prep, 2 antibiotics, and a fleets enema prior to surgery. Your antibiotics are Neomycin and Erythromycin and these will be taken at 8am, 2pm, 8pm on the day of your prep. (Please see the Bowel sheet provided)  Please see your [facility]) Pre-Care Sheet for more information. If you have any questions, please call our office and ask for a nurse.  End Colostomy Reversal An end colostomy reversal is surgery that reverses an end colostomy. The large intestine is disconnected from the opening in the abdomen (stoma). Then it is reconnected to the large intestine inside the body. A stoma and pouch are no longer needed. Bowel movements can resume through the rectum. LET [facility] CARE PROVIDER KNOW ABOUT:  Allergies to food or medicine.  Medicines taken, including vitamins, health supplements, herbs, eye drops, over-the-counter medicines, and creams.  Use of steroids (by mouth or creams).  Previous problems with anesthetics or numbing medicines.  History of bleeding problems or blood clots.  Previous surgery.  Other health problems, including diabetes and kidney problems.  Possibility of pregnancy, if this applies. RISKS AND COMPLICATIONS General surgical complications may include the  following:  Reaction to anesthetics.  Damage to surrounding nerves, tissues, or structures.  Blood clot.  Bleeding.  Scarring. Specific risks for colostomy reversal, while rare, may include:  Intestinal paralysis (ileus). This is a normal part of recovery. It usually goes away in 3-7 days. However, it can last longer in some people.  Leaking at the joined part of the intestine (anastomotic leak).  Infection of the surgical cut (incision) or the place where the stoma was located.  A collection of pus (abscess) in the abdomen or pelvis.  Intestinal blockage.  Narrowing at the joined part of the intestine (stricture).  Urinary and sexual dysfunction. BEFORE THE PROCEDURE It is important to follow your health care provider's instructions prior to your procedure. This will help you to avoid complications. Steps before your procedure may include:  A physical exam, rectal exam, X-rays, colonoscopy, and other procedures.  Chemotherapy or radiation therapy, if the stoma was created due to cancer.  A review of the procedure, the anesthetic being used, and what to expect after the procedure. You may be asked to:  Stop taking certain medicines for several days prior to your procedure. These may include blood thinners (such as aspirin).  Take certain medicines, such as antibiotics or stool softeners.  Avoid eating and drinking after midnight the night before the procedure. This will help you to avoid complications from the anesthetic.  Quit smoking. Smoking increases the chances of a healing problem after your procedure. PROCEDURE You will be given medicine that makes you sleep (general anesthetic). The procedure may be done  as open surgery, with a large incision. It may also be done as laparoscopic surgery, with several smaller incisions. The surgeon will stitch or staple the intestine ends back together. This surgery takes several hours. AFTER THE PROCEDURE  You will be given pain  medicine.  Slowly increase your diet and movement as directed by your health care provider.  You should arrange for someone to help you with activities at home while you recover.   This information is not intended to replace advice given to you by your health care provider. Make sure you discuss any questions you have with your health care provider.   Document Released: 06/27/2011 Document Revised: 08/19/2014 Document Reviewed: 06/27/2011 Elsevier Interactive Patient Education [employer].

## 2017-02-20 ENCOUNTER — Other Ambulatory Visit: Payer: Medicare Other

## 2017-02-20 ENCOUNTER — Encounter
Admission: RE | Admit: 2017-02-20 | Discharge: 2017-02-20 | Disposition: A | Discharge status: Home / Self Care | Payer: Medicare Other | Source: Ambulatory Visit | Attending: General Surgery | Admitting: General Surgery

## 2017-02-20 ENCOUNTER — Other Ambulatory Visit: Payer: Self-pay

## 2017-02-20 DIAGNOSIS — S2232XA Fracture of one rib, left side, initial encounter for closed fracture: Secondary | ICD-10-CM | POA: Diagnosis not present

## 2017-02-20 DIAGNOSIS — R296 Repeated falls: Secondary | ICD-10-CM | POA: Diagnosis not present

## 2017-02-20 DIAGNOSIS — W19XXXA Unspecified fall, initial encounter: Secondary | ICD-10-CM | POA: Diagnosis not present

## 2017-02-20 HISTORY — DX: Cardiac arrhythmia, unspecified: I49.9

## 2017-02-20 LAB — POTASSIUM: Potassium: 3.4 mmol/L — ABNORMAL LOW (ref 3.5–5.1)

## 2017-02-20 LAB — SURGICAL PCR SCREEN
MRSA, PCR: NEGATIVE
STAPHYLOCOCCUS AUREUS: NEGATIVE

## 2017-02-20 NOTE — Patient Instructions (Signed)
  Your procedure is scheduled on: Nov. 12, 2018 Monday Report to Day Surgery. Medical Mall, 2nd Floor  To find out your arrival time please call [phone] between 1PM - 3PM on .Friday Nov. 9, 2018  Remember: Instructions that are not followed completely may result in serious medical risk, up to and including death, or upon the discretion of your surgeon and anesthesiologist your surgery may need to be rescheduled.     _X__ 1. Do not eat food after midnight the night before your procedure.                 No gum chewing or hard candies. You may drink clear liquids up to 2 hours                 before you are scheduled to arrive for your surgery- DO not drink clear                 liquids within 2 hours of the start of your surgery.                 Clear Liquids include:  water, apple juice without pulp, clear carbohydrate                 drink such as Clearfast of Gartorade, Black Coffee or Tea (Do not add                 anything to coffee or tea).  NO Gatorade RED in COLOR   _X__ 2.  No Alcohol for 24 hours before or after surgery.    ____  4.  Bring all medications with you on the day of surgery if instructed.   __X_  5.  Notify your doctor if there is any change in your medical condition      (cold, fever, infections).     Do not wear jewelry, make-up, hairpins, clips or nail polish. Do not wear lotions, powders, or perfumes. You may wear deodorant. Do not shave 48 hours prior to surgery. Men may shave face and neck. Do not bring valuables to the hospital.    [facility] is not responsible for any belongings or valuables.  Contacts, dentures or bridgework may not be worn into surgery. Leave your suitcase in the car. After surgery it may be brought to your room. For patients admitted to the hospital, discharge time is determined by your treatment team.   Patients discharged the day of surgery will not be allowed to drive home.   Please read over the  following fact sheets that you were given:   MRSA Information          ____ Take these medicines the morning of surgery with A SIP OF WATER:    1. metoprolol succinate (TOPROL-XL) 25 MG 24 hr tablet  2.   3.   4.  5.  6.  ___X_ Fleet Enema (as directed) Sunday PM and Monday AM before surgery  __X__ Use CHG Soap as directe    ____ Stop Coumadin/Plavix/aspirin on (NO ASPIRIN OR ASPIRIN PRODUCTS)  ____ Stop Anti-inflammatories on ( DO NOT TAKE ANY AELVE, ADVIL, GOODY POWDER, ASIPRIN PRODUCTS)   ____ Stop supplements until after surgery.    ____ Bring C-Pap to the hospital.

## 2017-02-20 NOTE — Pre-Procedure Instructions (Signed)
Called Dr Amie Critchley regarding abnormal EKG, OK with me to involve cardiology. Prior cardiac clearance requested by Dr Hampton Abbot.

## 2017-02-21 ENCOUNTER — Telehealth: Payer: Self-pay | Admitting: Family Medicine

## 2017-02-21 ENCOUNTER — Telehealth: Payer: Self-pay

## 2017-02-21 ENCOUNTER — Other Ambulatory Visit: Payer: Self-pay

## 2017-02-21 ENCOUNTER — Ambulatory Visit: Payer: Self-pay | Admitting: *Deleted

## 2017-02-21 DIAGNOSIS — E876 Hypokalemia: Secondary | ICD-10-CM

## 2017-02-21 MED ORDER — POTASSIUM CHLORIDE CRYS ER 20 MEQ PO TBCR
20.0000 meq | EXTENDED_RELEASE_TABLET | Freq: Two times a day (BID) | ORAL | 0 refills | Status: DC
Start: 2017-02-21 — End: 2017-03-20

## 2017-02-21 NOTE — Telephone Encounter (Signed)
  Reason for Disposition . [1] Large swelling or bruise AND [2] size > palm of person's hand  Answer Assessment - Initial Assessment Questions 1. MECHANISM: "How did the injury happen?"     Fall, lost balance while walking on hardwood floor with socks  2. ONSET: "When did the injury happen?" (Minutes or hours ago)     Last night 3. LOCATION: "Where on the chest is the injury located?"     Left ribs, left eye 4. APPEARANCE: "What does the injury look like?"     Bruised, pt states he does not feel that it is broken 5. BLEEDING: "Is there any bleeding now? If so, ask: How long has it been bleeding?"     No 6. SEVERITY: "Any difficulty with breathing?"     No, but pt does state he has pain with deep breathing 7. SIZE: For cuts, bruises, or swelling, ask: "How large is it?" (e.g., inches or centimeters)     Did not obtain size of bruise 8. PAIN: "Is there pain?" If so, ask: "How bad is the pain?"   (e.g., Scale 1-10; or mild, moderate, severe)     No pain with sitting, pt states pain is 6-7 on a scale of 1-10, with deep breaths 9. TETANUS: For any breaks in the skin, ask: "When was the last tetanus booster?" pt did not voice any breaks in the skin.  Protocols used: CHEST INJURY-A-AH   Pt requesting pain medication after fall.Pt states he fell last night after walking with socks on hardwood floor and lost his balance. Pt states he bruised his ribs on the left side and has a black eye as well. Pt has complaints of pain with deep breathing, and states the pain on the left side of ribs is 6/7 on a scale of 1-10. Pt states he was advised by his surgeon that xrays were not needed at this time if he did not feel andy severe pain. Pt also wants to make PCP aware that he is going to have a reversal of colostomy on 02/27/17.

## 2017-02-21 NOTE — Telephone Encounter (Signed)
Patient was advised to go to walk-in, urgent care, or ED and he declined. Patient fell last night and he did hit his head. No confusion, no vision changes noted. He has bruising to left eye and left side. I have scheduled patient with Cameron Foster tomorrow at 11:30.

## 2017-02-21 NOTE — Telephone Encounter (Unsigned)
Copied from [place] #[id number]. >> Feb 21, 2017  2:54 PM Neva Seat wrote: Pt fell last night - in pain - sent to NT  Pt. Wanted to let Dr. Caryl Bis know - colostomy bag is will be revered on Mon. Nov. 12th

## 2017-02-21 NOTE — Pre-Procedure Instructions (Signed)
Potassium results sent to Dr. Adonis Huguenin and Anesthesia for review.

## 2017-02-21 NOTE — Telephone Encounter (Signed)
Noted and agree with evaluation.

## 2017-02-21 NOTE — Telephone Encounter (Signed)
Left message on patient's cell and home number informing him that his potassium levels being low. I let him know that we have sent over a potassium supplement to [facility] on [address]. for him to pick up. I advised that he is to start these supplements tomorrow taking them 2 time daily for the next 5 days.

## 2017-02-21 NOTE — Telephone Encounter (Signed)
Patient called and stated that he fell last night in his living room. He stated that his eye is swollen shut and that his wrist is in some pain. He stated that he has cracked his ribs in a prior fall and is unsure if it has happened again. Patient verbalized that he doesn't want to go to the ER to be examined without getting the okay from a doctor here in our office. I told him  That I will contact the day time doctor to see what he advises and give him a call back. Patient stated that he would rather come to our office to be seen. I verbalized to him that it would depend on what the on call doctor advised. Patient verbalized understanding and is currently awaiting a call back from me at this time.     Spoke with Dr. Burt Knack at this time and advised him of the situation with the patient and his fall last night. He stated the easiest thing for the patient would to be seen by Dr. Adonis Huguenin this week to be examined or if the patient is in a lot of pain to be seen in the ER for a fall. I verbalized understanding.   Call made to patient at this time. I advised him that I spoke with Dr. Burt Knack and that he suggest that the patient be seen by Dr. Adonis Huguenin this week if he's not having a great amount of pain but if so to be seen in the ER. Patient verbalized that he's not having a great amount of pain just wanting to make sure that everything is okay for Monday. I offered patient an appointment on Thursday 11/8 with Dr. Adonis Huguenin at 2:15. Patient accpected appointment. Told patient to call us back if he needed anything prior to being seen. Patient verbalized understanding.

## 2017-02-22 ENCOUNTER — Ambulatory Visit: Payer: Medicare Other | Admitting: Family

## 2017-02-22 ENCOUNTER — Telehealth: Payer: Self-pay

## 2017-02-22 DIAGNOSIS — Z433 Encounter for attention to colostomy: Secondary | ICD-10-CM | POA: Diagnosis not present

## 2017-02-22 DIAGNOSIS — Z48815 Encounter for surgical aftercare following surgery on the digestive system: Secondary | ICD-10-CM | POA: Diagnosis not present

## 2017-02-22 DIAGNOSIS — I10 Essential (primary) hypertension: Secondary | ICD-10-CM | POA: Diagnosis not present

## 2017-02-22 DIAGNOSIS — F329 Major depressive disorder, single episode, unspecified: Secondary | ICD-10-CM | POA: Diagnosis not present

## 2017-02-22 DIAGNOSIS — K5720 Diverticulitis of large intestine with perforation and abscess without bleeding: Secondary | ICD-10-CM | POA: Diagnosis not present

## 2017-02-22 DIAGNOSIS — K219 Gastro-esophageal reflux disease without esophagitis: Secondary | ICD-10-CM | POA: Diagnosis not present

## 2017-02-22 NOTE — Telephone Encounter (Signed)
Call made to patient at this time. I asked patient if he received my voicemail regarding the potassium supplements that were sent over for him. Patient verbalized that he picked up the supplements and started taking them this morning. Patient also wanted to know if he needed to go to his doctor's appointment regarding getting some pain medication for his fall a few days again. He stated that he was not sure if he needed to take pain medication prior to his surgery on Monday. I asked patient if he felt the need to take pain medication and he stated that he was feeling fine. I asked if he could wait until tomorrow when he sees Dr. Adonis Huguenin he verbalized that he could. I told him that it was up to him but that if he can just take ibuprofen or tylenol for any pain that would be great. He verbalized understanding.

## 2017-02-23 ENCOUNTER — Encounter: Payer: Self-pay | Admitting: General Surgery

## 2017-02-23 ENCOUNTER — Ambulatory Visit
Admission: RE | Admit: 2017-02-23 | Discharge: 2017-02-23 | Disposition: A | Discharge status: Home / Self Care | Payer: Medicare Other | Source: Ambulatory Visit | Attending: General Surgery | Admitting: General Surgery

## 2017-02-23 ENCOUNTER — Ambulatory Visit (INDEPENDENT_AMBULATORY_CARE_PROVIDER_SITE_OTHER): Payer: Medicare Other | Admitting: General Surgery

## 2017-02-23 VITALS — BP 155/87 | HR 71 | Temp 98.0°F | Ht 69.0 in | Wt 197.0 lb

## 2017-02-23 DIAGNOSIS — R296 Repeated falls: Secondary | ICD-10-CM | POA: Diagnosis not present

## 2017-02-23 DIAGNOSIS — W19XXXA Unspecified fall, initial encounter: Secondary | ICD-10-CM | POA: Insufficient documentation

## 2017-02-23 DIAGNOSIS — S2232XA Fracture of one rib, left side, initial encounter for closed fracture: Secondary | ICD-10-CM | POA: Diagnosis not present

## 2017-02-23 DIAGNOSIS — S299XXA Unspecified injury of thorax, initial encounter: Secondary | ICD-10-CM | POA: Diagnosis not present

## 2017-02-23 IMAGING — CR DG CHEST 2V
1 series · 2 of 2 positions shown · non-contrast
Comparison: [DATE]

CLINICAL DATA: fell at home and hit his left chest and left eye
earlier this week. He reports his eye was swollen shut until
yesterday. He still has pain with deep inspiration on the left. He
does not think his ribs are cracked because he has had broken ribs
before.

EXAM:
CHEST  2 VIEW

[Series 1: dg chest 2 view · 0.14mm/px · 2 of 2 slices shown]
[im 1/2]
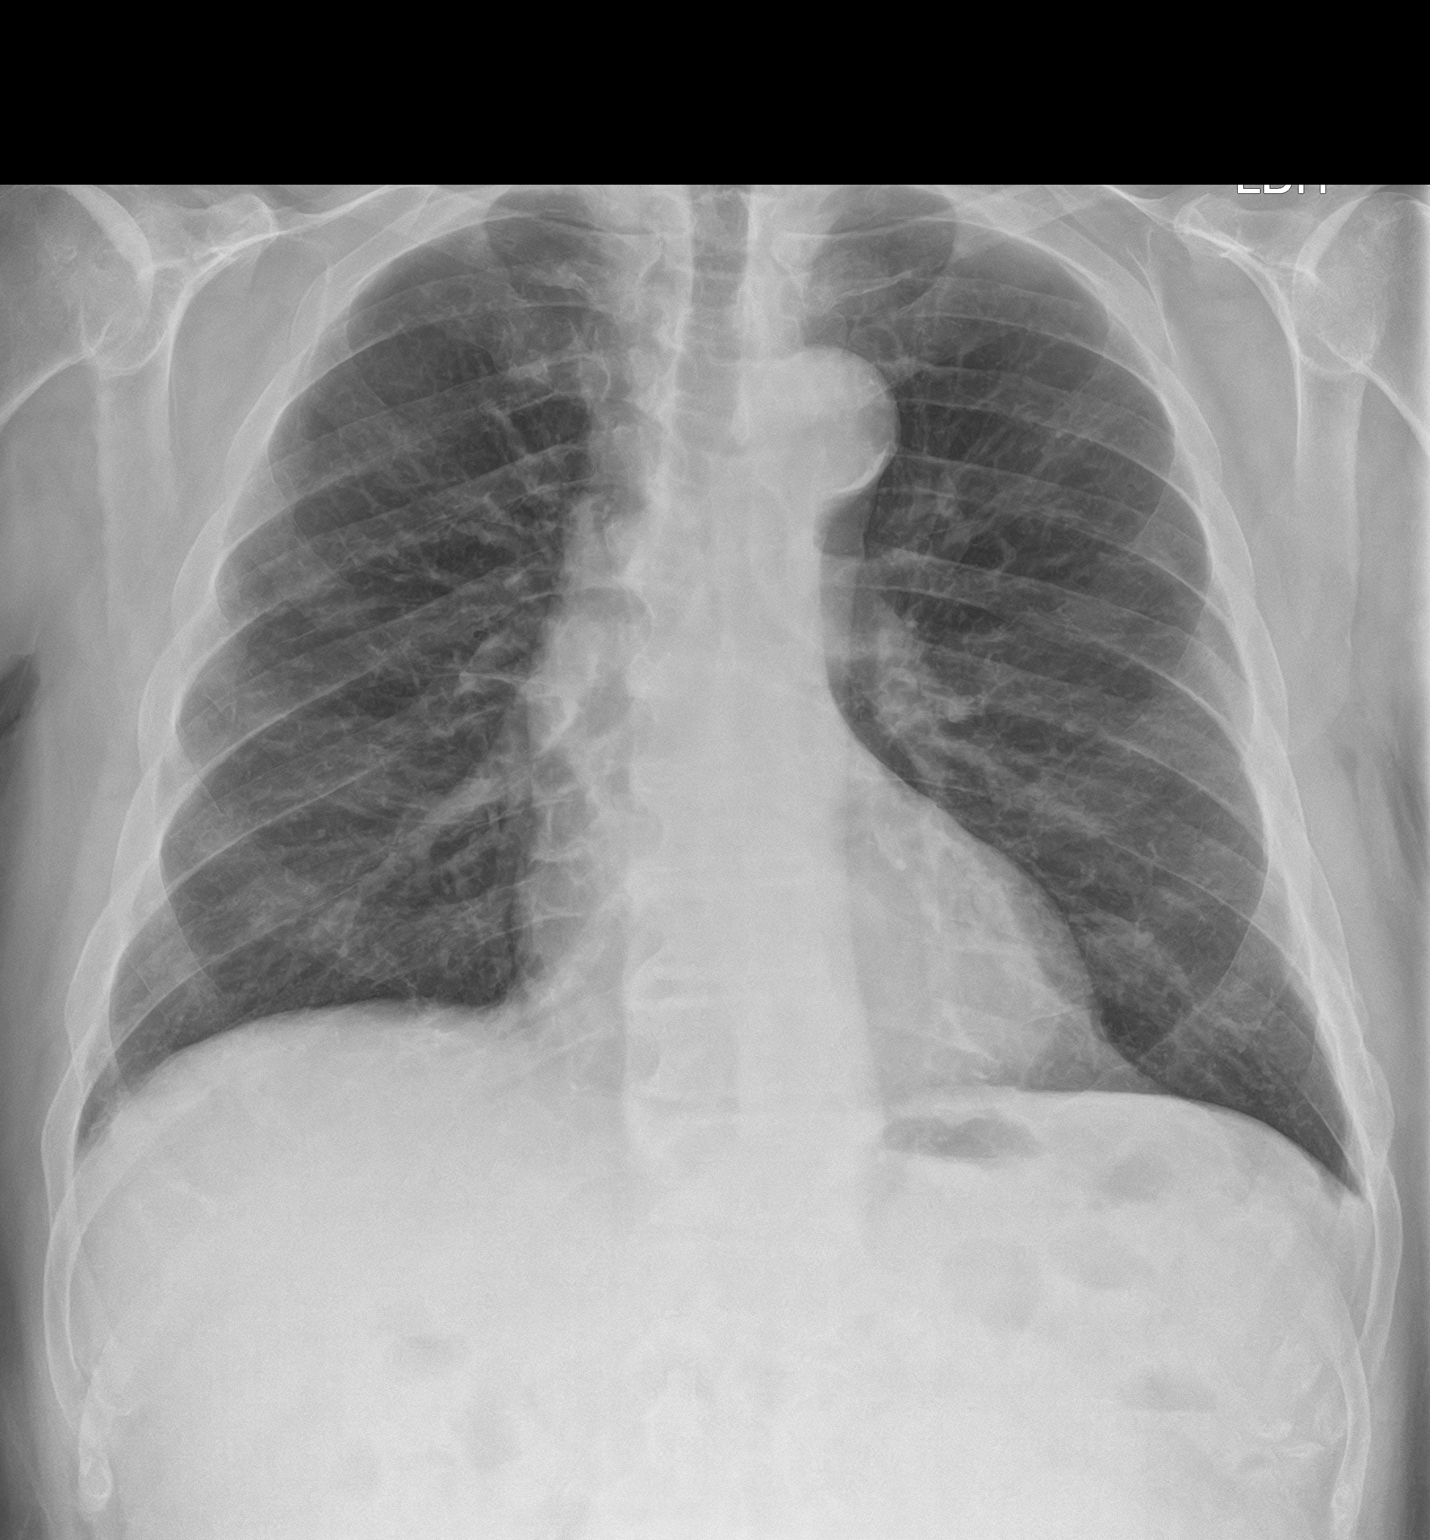
[im 2/2]
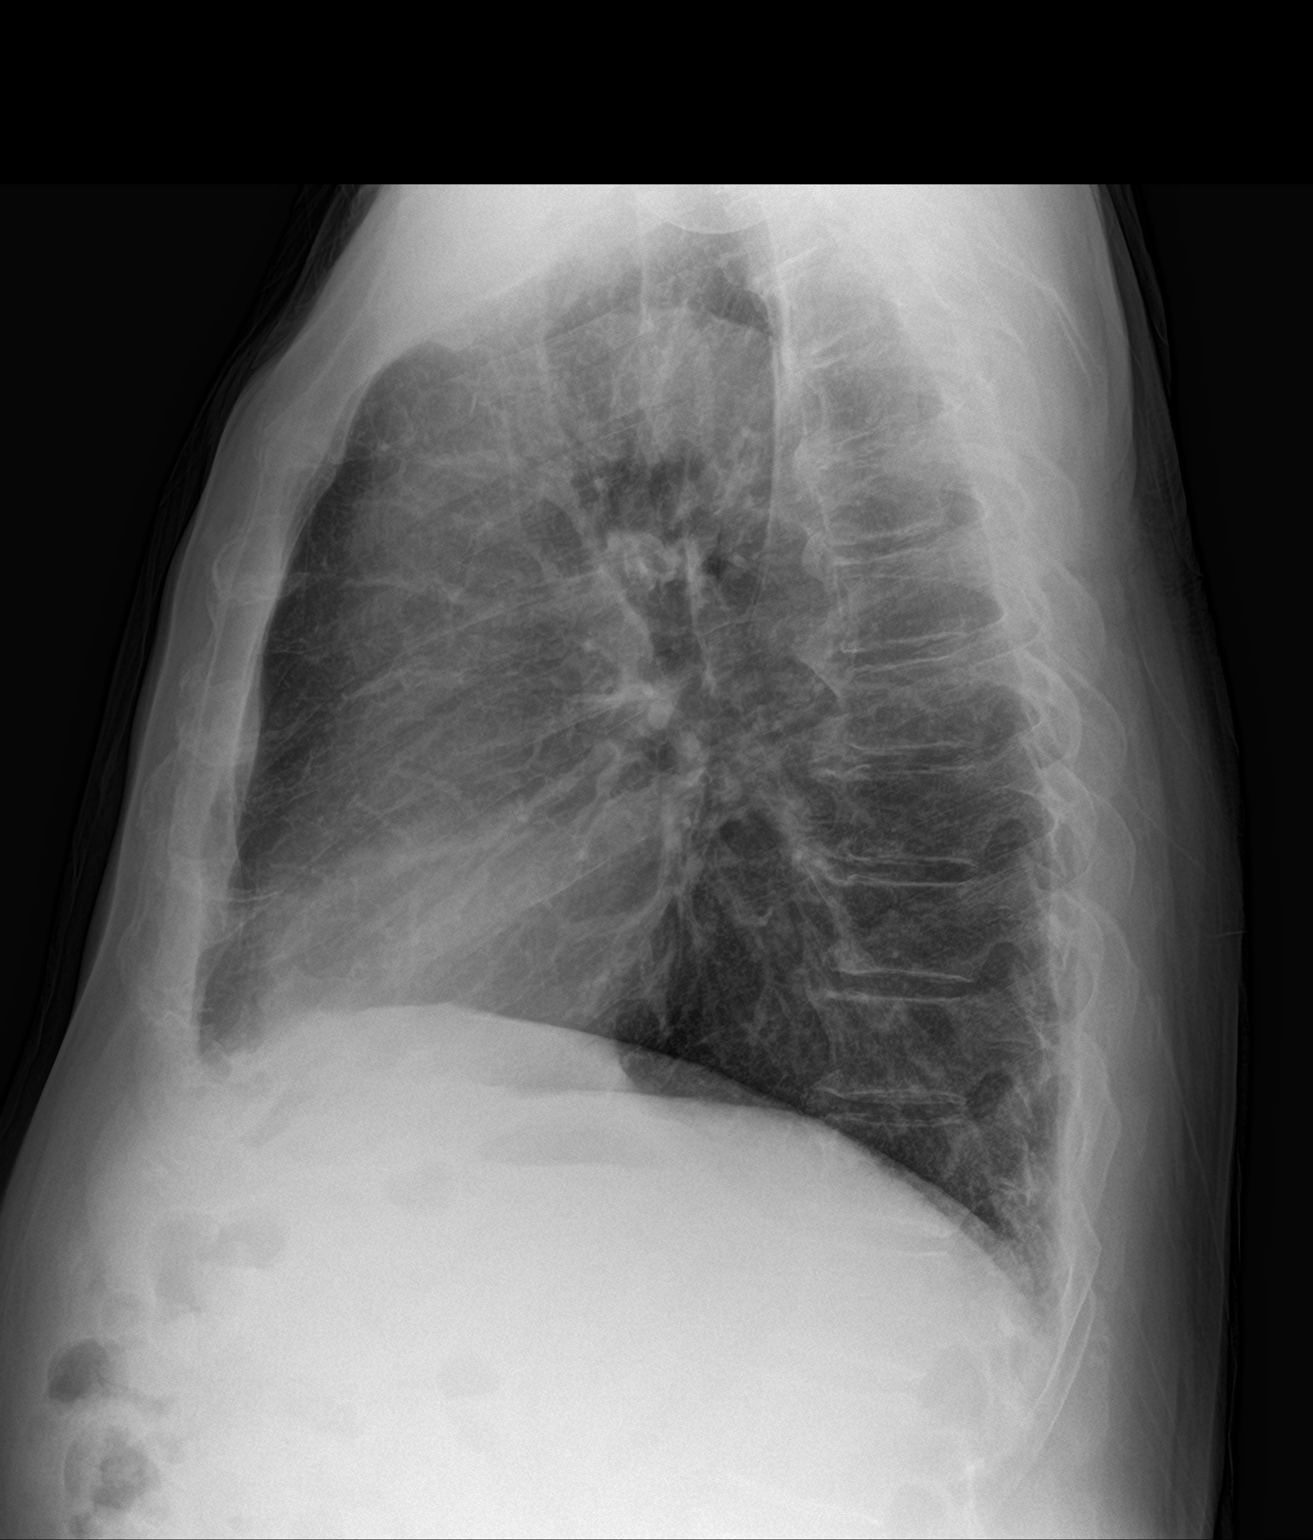

[2 of 2 positions shown; findings below may reference images not displayed]

FINDINGS: Heart size is normal. The aorta is tortuous and partially calcified.
There are no focal consolidations or pleural effusions. No pulmonary
edema or pneumothorax. Degenerative changes are seen in thoracic
spine. Remote left rib fractures. No acute displaced fractures.
Femoral
IMPRESSION: No evidence for acute cardiopulmonary abnormality. Remote left rib
fractures.

## 2017-02-23 NOTE — Progress Notes (Signed)
Outpatient Surgical Follow Up  02/23/2017  Cameron Foster. is an 76 y.o. male.   Chief Complaint  Patient presents with  . Other    End Colostomy Reversal scheduled for 11/12 - Fell on 11/5 wanting to be checked prior to having surgery-Per Dr. Burt Knack    HPI: 76 year old male returns to clinic for follow-up.  He fell at home and hit his left chest and left eye earlier this week.  He reports his eye was swollen shut until yesterday.  He still has pain with deep inspiration on the left.  He does not think his ribs are cracked because he has had broken ribs before.  However the pain persist.  He reports no loss of consciousness and that it was from slipping and falling in the bathroom.  He denies any fevers, chills, shortness of breath, diarrhea, constipation.  His ostomy is in place and working well.  He was scheduled for an ostomy reversal next week.  Past Medical History:  Diagnosis Date  . Alcoholism ([place])   . Arrhythmia   . Arthritis   . Asthma   . Atrial fibrillation ([place])    one episode  . Colon polyp   . Depression    Son died 2014/06/16  . Diverticulitis   . Diverticulosis 30 years  . Dysrhythmia    At Fib 1995  . Fainting   . GERD (gastroesophageal reflux disease)   . History of blood clots 06-17-15   pt states" clots in my urine"  . Hyperlipidemia   . Hypertension   . Irritable bowel syndrome   . Prostate cancer ([place]) 06/16/10   treated with radiation therapy. Prostate  . Vasovagal syncope   . Vertigo     Past Surgical History:  Procedure Laterality Date  . CARDIAC CATHETERIZATION     [place] no stents  . CATARACT EXTRACTION, BILATERAL    . COLON SURGERY  11/2016   Colostomy  . COLONOSCOPY  06/16/14  . ESOPHAGOGASTRODUODENOSCOPY    . EYE SURGERY     cataracts  . FRACTURE SURGERY Bilateral    right arm and left wrist  . KNEE ARTHROSCOPY    . PROSTATE SURGERY     Microwave therapy  . REPLACEMENT TOTAL KNEE Right 07/2016  . TONSILLECTOMY      Family History   Problem Relation Age of Onset  . Lung cancer Father   . Other Mother   . Bipolar disorder Son   . Sudden death Son        due to drug over dose and cardiac issues  . Prostate cancer Neg Hx   . Bladder Cancer Neg Hx     Social History:  reports that he quit smoking about 28 years ago. He quit after 27.00 years of use. His smokeless tobacco use includes chew. He reports that he drinks about 1.2 oz of alcohol per week. He reports that he does not use drugs.  Allergies:  Allergies  Allergen Reactions  . Augmentin [Amoxicillin-Pot Clavulanate] Other (See Comments)    Abdominal upset  . Formaldehyde Rash  . Tape Rash    Medications reviewed.    ROS A multipoint review of systems was completed, all pertinent positives and negatives are documented in the HPI and remain   BP (!) 155/87   Pulse 71   Temp 98 F (36.7 C) (Oral)   Ht 5' 9"  (1.753 m)   Wt 89.4 kg (197 lb)   BMI 29.09 kg/m   Physical Exam General:  No acute distress Head: Left eye with periorbital ecchymosis.  Extraocular motion intact Neck: Supple nontender Chest: Breath sounds are clear all quadrants but the left chest is tender to palpation and there is a popping sound on auscultation during deep inspiration Heart: Regular rate and rhythm Abdomen: Soft, nontender, nondistended.  End colostomy is pink, patent, productive of gas and stool    No results found for this or any previous visit (from the past 48 hour(s)). No results found.  Assessment/Plan:  1. Fall in elderly patient 76 year old male who fell at home here for further evaluation.  Although there is no external signs of bruising to the chest there is a popping sound on auscultation.  Given this we will obtain a chest x-ray to look for potentially broken ribs.  He also has ecchymosis to his left eye.  Discussed with the patient that regardless of the findings of the chest x-ray that we will delay his surgery as he does not need to be undergoing a  general anesthetic while recovering from a trauma.  Patient will follow-up in clinic on the 19th to ensure that he is getting better prior to being placed back on the operative schedule. - DG Chest 2 View; Future     Clayburn Pert, MD FACS General Surgeon  02/23/2017,2:47 PM

## 2017-02-23 NOTE — Patient Instructions (Signed)
We will send you for a Chest X-ray today. You do not need an appointment. These are done Monday-Friday 7:30am-5:30pm. They are done in the [facility] at [place] will check in at the Registration Desk. This is the 1st desk on your right hand side after entering the medical mall. Please see walking directions below if needed.  Directions to Medical Mall: When leaving our office, go right. Go all of the way down to the very end of the hallway. You will have a purple wall in front of you. You will now have a tunnel to the hospital on your left hand side. Go through this tunnel and the elevators will be on your left. Go down to the 1st floor and take a slight left. The very first desk on the right hand side is the registration desk.  We will call you with the results and next step in the plan of care as soon as results are received.

## 2017-02-24 ENCOUNTER — Telehealth: Payer: Self-pay

## 2017-02-24 ENCOUNTER — Telehealth: Payer: Self-pay | Admitting: General Surgery

## 2017-02-24 DIAGNOSIS — Z48815 Encounter for surgical aftercare following surgery on the digestive system: Secondary | ICD-10-CM | POA: Diagnosis not present

## 2017-02-24 DIAGNOSIS — K5720 Diverticulitis of large intestine with perforation and abscess without bleeding: Secondary | ICD-10-CM | POA: Diagnosis not present

## 2017-02-24 DIAGNOSIS — Z433 Encounter for attention to colostomy: Secondary | ICD-10-CM | POA: Diagnosis not present

## 2017-02-24 DIAGNOSIS — F329 Major depressive disorder, single episode, unspecified: Secondary | ICD-10-CM | POA: Diagnosis not present

## 2017-02-24 DIAGNOSIS — K219 Gastro-esophageal reflux disease without esophagitis: Secondary | ICD-10-CM | POA: Diagnosis not present

## 2017-02-24 DIAGNOSIS — I10 Essential (primary) hypertension: Secondary | ICD-10-CM | POA: Diagnosis not present

## 2017-02-24 NOTE — Telephone Encounter (Signed)
Patient called earlier this morning wanting to know his X-Ray results. I gave it to him and he wanted to know if there was anything for him to do before he came in and saw Dr. Adonis Huguenin. I told him that I would ask Dr. Adonis Huguenin and then call him back. Patient understood.  Called patient back after speaking to Dr. Adonis Huguenin and told him that Dr. Adonis Huguenin want him o take it easy and to be careful not to fall again and that he will see him back on 03/06/2017 and possibly discuss a surgery date. Patient understood and had no further questions at this time.

## 2017-02-24 NOTE — Telephone Encounter (Signed)
Surgery has been cancelled per Dr Reginal Lutes request, due to patient injury during a fall. Patient was scheduled for a laparoscopic colostomy reversal on  02/27/17. Once patient recovers from the injury the patient will be seen back in the office to discuss another surgery date.   I have contacted Leah in the [facility] as well as Dr Cherrie Gauze office to cancel the stent placement.

## 2017-02-27 ENCOUNTER — Encounter: Admission: RE | Payer: Self-pay | Source: Ambulatory Visit

## 2017-02-27 ENCOUNTER — Inpatient Hospital Stay: Admission: RE | Admit: 2017-02-27 | Payer: Medicare Other | Source: Ambulatory Visit | Admitting: Surgery

## 2017-02-27 SURGERY — CLOSURE, COLOSTOMY, LAPAROSCOPIC
Anesthesia: Choice

## 2017-02-28 ENCOUNTER — Telehealth: Payer: Self-pay | Admitting: General Surgery

## 2017-02-28 NOTE — Telephone Encounter (Signed)
Called patient back to let him know that I had spoken to Dr. Rosana Hoes and that he was okay for him to try a fleet's enema but that it's normal for him to have the urge to use the restroom, then eventually will come out. Patient stated that he understood and that he would try doing the fleet's enema to see if he is able to have a rectal bowel movement and if nothing happens, then he will not worry about it. I told him that it's correct but if he had further questions, to call us back. Patient understood and had no further questions.

## 2017-02-28 NOTE — Telephone Encounter (Signed)
Patient would like to discuss some concerns that he is having with mucus. Please advise

## 2017-02-28 NOTE — Telephone Encounter (Signed)
Called patient and asked how I could help him. He stated that he feels that he needs to have a bowel movement through his rectum. However, he understands that he is only to pass mucus instead of stool. Therefore, he wanted to know if he was able to insert a fleet enema and let that do its work or what he was to do. I told him that I wasn't sure if he he could do a fleet's enema. So I told him that I would ask Dr. Rosana Hoes and that I would call him back as soon as I had an answer. Patient understood.

## 2017-03-01 DIAGNOSIS — K219 Gastro-esophageal reflux disease without esophagitis: Secondary | ICD-10-CM | POA: Diagnosis not present

## 2017-03-01 DIAGNOSIS — Z48815 Encounter for surgical aftercare following surgery on the digestive system: Secondary | ICD-10-CM | POA: Diagnosis not present

## 2017-03-01 DIAGNOSIS — K5720 Diverticulitis of large intestine with perforation and abscess without bleeding: Secondary | ICD-10-CM | POA: Diagnosis not present

## 2017-03-01 DIAGNOSIS — F329 Major depressive disorder, single episode, unspecified: Secondary | ICD-10-CM | POA: Diagnosis not present

## 2017-03-01 DIAGNOSIS — Z433 Encounter for attention to colostomy: Secondary | ICD-10-CM | POA: Diagnosis not present

## 2017-03-01 DIAGNOSIS — I10 Essential (primary) hypertension: Secondary | ICD-10-CM | POA: Diagnosis not present

## 2017-03-06 ENCOUNTER — Ambulatory Visit (INDEPENDENT_AMBULATORY_CARE_PROVIDER_SITE_OTHER): Payer: Medicare Other | Admitting: General Surgery

## 2017-03-06 ENCOUNTER — Encounter: Payer: Self-pay | Admitting: General Surgery

## 2017-03-06 VITALS — BP 128/75 | HR 80 | Temp 98.1°F | Ht 69.0 in | Wt 197.6 lb

## 2017-03-06 DIAGNOSIS — Z01818 Encounter for other preprocedural examination: Secondary | ICD-10-CM

## 2017-03-06 MED ORDER — FLEET ENEMA 7-19 GM/118ML RE ENEM: ENEMA | RECTAL | 0 refills | Status: DC

## 2017-03-06 NOTE — H&P (View-Only) (Signed)
Outpatient Surgical Follow Up  03/06/2017  Cameron Foster. is an 76 y.o. male.   Chief Complaint  Patient presents with  . Other     End Colostomy Reversal scheduled for 11/12 [facility] on 11/5 Chest x-ray 11/58    HPI: 76 year old male returns to clinic for follow-up from a recent fall and to potentially schedule his colostomy reversal.  Patient reports feeling well.  He has not fallen again since his last fall.  He is breathing fine without any chest pain.  He is eating fine and denies any fevers, chills, nausea, vomiting, chest pain, shortness of breath.  He does have a weird blister at the bottom part of his ostomy appliance but otherwise no abdominal complaints.  Past Medical History:  Diagnosis Date  . Alcoholism ([place])   . Arrhythmia   . Arthritis   . Asthma   . Atrial fibrillation ([place])    one episode  . Colon polyp   . Depression    Son died 09-Jul-2014  . Diverticulitis   . Diverticulosis 30 years  . Dysrhythmia    At Fib 1995  . Fainting   . GERD (gastroesophageal reflux disease)   . History of blood clots 2015-07-10   pt states" clots in my urine"  . Hyperlipidemia   . Hypertension   . Irritable bowel syndrome   . Prostate cancer ([place]) 07/09/2010   treated with radiation therapy. Prostate  . Vasovagal syncope   . Vertigo     Past Surgical History:  Procedure Laterality Date  . CARDIAC CATHETERIZATION     [place] no stents  . CATARACT EXTRACTION, BILATERAL    . COLON RESECTION SIGMOID N/A 12/07/2016   Performed by Clayburn Pert, MD at [facility] ORS  . COLON SURGERY  11/2016   Colostomy  . COLONOSCOPY  July 09, 2014  . COLONOSCOPY WITH PROPOFOL N/A 05/30/2016   Performed by Lollie Sails, MD at [facility]  . COLOSTOMY Left 12/07/2016   Performed by Clayburn Pert, MD at [facility] ORS  . DRAINAGE  OF INTRA ABDOMINAL ABSCESS N/A 12/07/2016   Performed by Clayburn Pert, MD at [facility] ORS  . ESOPHAGOGASTRODUODENOSCOPY    . ESOPHAGOGASTRODUODENOSCOPY (EGD) N/A 05/30/2016   Performed by Lollie Sails, MD at [facility]  . EXPLORATORY LAPAROTOMY N/A 12/07/2016   Performed by Clayburn Pert, MD at [facility] ORS  . EYE SURGERY     cataracts  . FRACTURE SURGERY Bilateral    right arm and left wrist  . KNEE ARTHROSCOPY    . PROSTATE SURGERY     Microwave therapy  . REPLACEMENT TOTAL KNEE Right 07/2016  . TONSILLECTOMY    . TOTAL KNEE ARTHROPLASTY Right 07/27/2016   Performed by [name], MD at [facility] ORS    Family History  Problem Relation Age of Onset  . Lung cancer Father   . Other Mother   . Bipolar disorder Son   . Sudden death Son        due to drug over dose and cardiac issues  . Prostate cancer Neg Hx   . Bladder Cancer Neg Hx     Social History:  reports that he quit smoking about 28 years ago. He quit after 27.00 years of use. His smokeless tobacco use includes chew. He reports that he drinks about 1.2 oz of alcohol per week. He reports that he does not use drugs.  Allergies:  Allergies  Allergen Reactions  . Augmentin [Amoxicillin-Pot Clavulanate] Other (See Comments)  Abdominal upset  . Formaldehyde Rash  . Tape Rash    Medications reviewed.    ROS Multipoint review of systems was completed, all pertinent positives and negatives are documented within the HPI and the remainder are negative   BP 128/75   Pulse 80   Temp 98.1 F (36.7 C) (Oral)   Ht 5' 9"  (1.753 m)   Wt 89.6 kg (197 lb 9.6 oz)   BMI 29.18 kg/m   Physical Exam General: No acute distress Chest: Clear to auscultation and nontender Heart: Regular rate and rhythm Abdomen: Soft, nontender, nondistended.  Well-healed midline incision with an end colostomy in the left upper quadrant that is pink, patent, productive of gas and stool. Skin: Left periorbital ecchymosis is resolving.    No results found for this or any previous visit (from the past 48 hour(s)). No results found.  Assessment/Plan:  1. Pre-operative exam 76 year old male with an end  colostomy secondary to perforated diverticulitis.  Discussed that we will put him back on the schedule for 4 December for his ostomy reversal.  He will need to have repeat films and labs as well as a repeat anesthesia preop next week before proceeding to the operating room.  Patient voiced understanding and is excited to proceed with his ostomy reversal. - DG Chest 2 View; Future - CBC with Differential; Future - Comprehensive metabolic panel; Future     Clayburn Pert, MD FACS General Surgeon  03/06/2017,3:54 PM

## 2017-03-06 NOTE — Progress Notes (Signed)
Outpatient Surgical Follow Up  03/06/2017  Cameron Foster. is an 76 y.o. male.   Chief Complaint  Patient presents with  . Other     End Colostomy Reversal scheduled for 11/12 [facility] [address] on 11/5 Chest x-ray 11/78    HPI: 76 year old male returns to clinic for follow-up from a recent fall and to potentially schedule his colostomy reversal.  Patient reports feeling well.  He has not fallen again since his last fall.  He is breathing fine without any chest pain.  He is eating fine and denies any fevers, chills, nausea, vomiting, chest pain, shortness of breath.  He does have a weird blister at the bottom part of his ostomy appliance but otherwise no abdominal complaints.  Past Medical History:  Diagnosis Date  . Alcoholism ([place])   . Arrhythmia   . Arthritis   . Asthma   . Atrial fibrillation ([place])    one episode  . Colon polyp   . Depression    Son died July 01, 2014  . Diverticulitis   . Diverticulosis 30 years  . Dysrhythmia    At Fib 1995  . Fainting   . GERD (gastroesophageal reflux disease)   . History of blood clots 2015-07-02   pt states" clots in my urine"  . Hyperlipidemia   . Hypertension   . Irritable bowel syndrome   . Prostate cancer ([place]) 07/01/2010   treated with radiation therapy. Prostate  . Vasovagal syncope   . Vertigo     Past Surgical History:  Procedure Laterality Date  . CARDIAC CATHETERIZATION     [place] stents  . CATARACT EXTRACTION, BILATERAL    . COLON RESECTION SIGMOID N/A 12/07/2016   Performed by Clayburn Pert, MD at [facility] ORS  . COLON SURGERY  11/2016   Colostomy  . COLONOSCOPY  Jul 01, 2014  . COLONOSCOPY WITH PROPOFOL N/A 05/30/2016   Performed by Lollie Sails, MD at [facility]  . COLOSTOMY Left 12/07/2016   Performed by Clayburn Pert, MD at [facility] ORS  . DRAINAGE  OF INTRA ABDOMINAL ABSCESS N/A 12/07/2016   Performed by Clayburn Pert, MD at [facility] ORS  . ESOPHAGOGASTRODUODENOSCOPY    . ESOPHAGOGASTRODUODENOSCOPY (EGD) N/A 05/30/2016   Performed by Lollie Sails, MD at [facility]  . EXPLORATORY LAPAROTOMY N/A 12/07/2016   Performed by Clayburn Pert, MD at [facility] ORS  . EYE SURGERY     cataracts  . FRACTURE SURGERY Bilateral    right arm and left wrist  . KNEE ARTHROSCOPY    . PROSTATE SURGERY     Microwave therapy  . REPLACEMENT TOTAL KNEE Right 07/2016  . TONSILLECTOMY    . TOTAL KNEE ARTHROPLASTY Right 07/27/2016   Performed by [name], MD at [facility] ORS    Family History  Problem Relation Age of Onset  . Lung cancer Father   . Other Mother   . Bipolar disorder Son   . Sudden death Son        due to drug over dose and cardiac issues  . Prostate cancer Neg Hx   . Bladder Cancer Neg Hx     Social History:  reports that he quit smoking about 28 years ago. He quit after 27.00 years of use. His smokeless tobacco use includes chew. He reports that he drinks about 1.2 oz of alcohol per week. He reports that he does not use drugs.  Allergies:  Allergies  Allergen Reactions  . Augmentin [Amoxicillin-Pot Clavulanate] Other (See Comments)  Abdominal upset  . Formaldehyde Rash  . Tape Rash    Medications reviewed.    ROS Multipoint review of systems was completed, all pertinent positives and negatives are documented within the HPI and the remainder are negative   BP 128/75   Pulse 80   Temp 98.1 F (36.7 C) (Oral)   Ht 5' 9"  (1.753 m)   Wt 89.6 kg (197 lb 9.6 oz)   BMI 29.18 kg/m   Physical Exam General: No acute distress Chest: Clear to auscultation and nontender Heart: Regular rate and rhythm Abdomen: Soft, nontender, nondistended.  Well-healed midline incision with an end colostomy in the left upper quadrant that is pink, patent, productive of gas and stool. Skin: Left periorbital ecchymosis is resolving.    No results found for this or any previous visit (from the past 48 hour(s)). No results found.  Assessment/Plan:  1. Pre-operative exam 76 year old male with an end  colostomy secondary to perforated diverticulitis.  Discussed that we will put him back on the schedule for 4 December for his ostomy reversal.  He will need to have repeat films and labs as well as a repeat anesthesia preop next week before proceeding to the operating room.  Patient voiced understanding and is excited to proceed with his ostomy reversal. - DG Chest 2 View; Future - CBC with Differential; Future - Comprehensive metabolic panel; Future     Clayburn Pert, MD FACS General Surgeon  03/06/2017,3:54 PM

## 2017-03-06 NOTE — Patient Instructions (Signed)
We have spoken today about reversing your Ostomy. You are requesting to have this done.  We will arrange this to be done on 12/4 by Dr. Adonis Huguenin at [facility].   Plan on being in the hospital between 5-7 days after surgery. You will be started on a liquid diet and then advanced as tolerated prior to going home.  If you have any disability or FMLA paperwork that needs to be filled out for your employer, please bring this in prior to surgery and it will be filled out upon your discharge from the hospital. We can give you a note anticipating your surgery date. If your employer is in need of this, please let us know.  To prep for your surgery, You will need to complete a bowel prep, 2 antibiotics, and a fleets enema prior to surgery. Your antibiotics are Neomycin and Erythromycin and these will be taken at 8am, 2pm, 8pm on the day of your prep. (Please see the Bowel sheet provided)  Please see your [facility]) Pre-Care Sheet for more information. If you have any questions, please call our office and ask for a nurse.  End Colostomy Reversal An end colostomy reversal is surgery that reverses an end colostomy. The large intestine is disconnected from the opening in the abdomen (stoma). Then it is reconnected to the large intestine inside the body. A stoma and pouch are no longer needed. Bowel movements can resume through the rectum. LET [facility] CARE PROVIDER KNOW ABOUT:  Allergies to food or medicine.  Medicines taken, including vitamins, health supplements, herbs, eye drops, over-the-counter medicines, and creams.  Use of steroids (by mouth or creams).  Previous problems with anesthetics or numbing medicines.  History of bleeding problems or blood clots.  Previous surgery.  Other health problems, including diabetes and kidney problems.  Possibility of pregnancy, if this applies. RISKS AND COMPLICATIONS General surgical complications may include the following:  Reaction to anesthetics.  Damage  to surrounding nerves, tissues, or structures.  Blood clot.  Bleeding.  Scarring. Specific risks for colostomy reversal, while rare, may include:  Intestinal paralysis (ileus). This is a normal part of recovery. It usually goes away in 3-7 days. However, it can last longer in some people.  Leaking at the joined part of the intestine (anastomotic leak).  Infection of the surgical cut (incision) or the place where the stoma was located.  A collection of pus (abscess) in the abdomen or pelvis.  Intestinal blockage.  Narrowing at the joined part of the intestine (stricture).  Urinary and sexual dysfunction. BEFORE THE PROCEDURE It is important to follow your health care provider's instructions prior to your procedure. This will help you to avoid complications. Steps before your procedure may include:  A physical exam, rectal exam, X-rays, colonoscopy, and other procedures.  Chemotherapy or radiation therapy, if the stoma was created due to cancer.  A review of the procedure, the anesthetic being used, and what to expect after the procedure. You may be asked to:  Stop taking certain medicines for several days prior to your procedure. These may include blood thinners (such as aspirin).  Take certain medicines, such as antibiotics or stool softeners.  Avoid eating and drinking after midnight the night before the procedure. This will help you to avoid complications from the anesthetic.  Quit smoking. Smoking increases the chances of a healing problem after your procedure. PROCEDURE You will be given medicine that makes you sleep (general anesthetic). The procedure may be done as open surgery, with a  large incision. It may also be done as laparoscopic surgery, with several smaller incisions. The surgeon will stitch or staple the intestine ends back together. This surgery takes several hours. AFTER THE PROCEDURE  You will be given pain medicine.  Slowly increase your diet and  movement as directed by your health care provider.  You should arrange for someone to help you with activities at home while you recover.   This information is not intended to replace advice given to you by your health care provider. Make sure you discuss any questions you have with your health care provider.   Document Released: 06/27/2011 Document Revised: 08/19/2014 Document Reviewed: 06/27/2011 Elsevier Interactive Patient Education [employer].

## 2017-03-08 ENCOUNTER — Telehealth: Payer: Self-pay | Admitting: General Surgery

## 2017-03-08 ENCOUNTER — Other Ambulatory Visit: Payer: Self-pay | Admitting: Radiology

## 2017-03-08 DIAGNOSIS — Z48815 Encounter for surgical aftercare following surgery on the digestive system: Secondary | ICD-10-CM | POA: Diagnosis not present

## 2017-03-08 DIAGNOSIS — F329 Major depressive disorder, single episode, unspecified: Secondary | ICD-10-CM | POA: Diagnosis not present

## 2017-03-08 DIAGNOSIS — K219 Gastro-esophageal reflux disease without esophagitis: Secondary | ICD-10-CM | POA: Diagnosis not present

## 2017-03-08 DIAGNOSIS — I10 Essential (primary) hypertension: Secondary | ICD-10-CM | POA: Diagnosis not present

## 2017-03-08 DIAGNOSIS — K5720 Diverticulitis of large intestine with perforation and abscess without bleeding: Secondary | ICD-10-CM | POA: Diagnosis not present

## 2017-03-08 DIAGNOSIS — Z433 Encounter for attention to colostomy: Secondary | ICD-10-CM | POA: Diagnosis not present

## 2017-03-08 NOTE — Telephone Encounter (Signed)
Pt advised of pre op date/time and sx date. Sx: 03/21/17 with Dr Gabrielle Dare colostomy reversal, possible open. Pre op: 03/15/17 @ 1:00pm--office interview.   Patient made aware to call [phone], between 1-3:00pm the day before surgery, to find out what time to arrive.

## 2017-03-13 ENCOUNTER — Telehealth: Payer: Self-pay | Admitting: General Practice

## 2017-03-13 NOTE — Telephone Encounter (Signed)
Patient has called back and was given all surgery information. Patient understands.

## 2017-03-13 NOTE — Telephone Encounter (Signed)
Patient called and left a message on Friday at 9:44am asking for you to give him a call back, said he needed to speak with you. Please call patient and advice.

## 2017-03-14 ENCOUNTER — Other Ambulatory Visit: Payer: Medicare Other

## 2017-03-14 ENCOUNTER — Telehealth: Payer: Self-pay | Admitting: General Surgery

## 2017-03-14 NOTE — Telephone Encounter (Signed)
Would like to get orders for additional visits until surgery - Please advise

## 2017-03-15 ENCOUNTER — Encounter
Admission: RE | Admit: 2017-03-15 | Discharge: 2017-03-15 | Disposition: A | Discharge status: Home / Self Care | Payer: Medicare Other | Source: Ambulatory Visit | Attending: General Surgery | Admitting: General Surgery

## 2017-03-15 ENCOUNTER — Telehealth: Payer: Self-pay

## 2017-03-15 ENCOUNTER — Other Ambulatory Visit: Payer: Self-pay

## 2017-03-15 ENCOUNTER — Telehealth: Payer: Self-pay | Admitting: General Practice

## 2017-03-15 DIAGNOSIS — Z01818 Encounter for other preprocedural examination: Secondary | ICD-10-CM | POA: Diagnosis not present

## 2017-03-15 DIAGNOSIS — Z433 Encounter for attention to colostomy: Secondary | ICD-10-CM | POA: Diagnosis not present

## 2017-03-15 DIAGNOSIS — K5720 Diverticulitis of large intestine with perforation and abscess without bleeding: Secondary | ICD-10-CM | POA: Diagnosis not present

## 2017-03-15 DIAGNOSIS — K219 Gastro-esophageal reflux disease without esophagitis: Secondary | ICD-10-CM | POA: Diagnosis not present

## 2017-03-15 DIAGNOSIS — I10 Essential (primary) hypertension: Secondary | ICD-10-CM | POA: Diagnosis not present

## 2017-03-15 DIAGNOSIS — Z48815 Encounter for surgical aftercare following surgery on the digestive system: Secondary | ICD-10-CM | POA: Diagnosis not present

## 2017-03-15 DIAGNOSIS — F329 Major depressive disorder, single episode, unspecified: Secondary | ICD-10-CM | POA: Diagnosis not present

## 2017-03-15 LAB — HEMOGLOBIN A1C
HEMOGLOBIN A1C: 5.2 % (ref 4.8–5.6)
MEAN PLASMA GLUCOSE: 102.54 mg/dL

## 2017-03-15 LAB — CBC
HCT: 35.2 % — ABNORMAL LOW (ref 40.0–52.0)
Hemoglobin: 11.5 g/dL — ABNORMAL LOW (ref 13.0–18.0)
MCH: 28 pg (ref 26.0–34.0)
MCHC: 32.6 g/dL (ref 32.0–36.0)
MCV: 85.8 fL (ref 80.0–100.0)
PLATELETS: 291 10*3/uL (ref 150–440)
RBC: 4.1 MIL/uL — ABNORMAL LOW (ref 4.40–5.90)
RDW: 14.4 % (ref 11.5–14.5)
WBC: 6.2 10*3/uL (ref 3.8–10.6)

## 2017-03-15 LAB — POTASSIUM: POTASSIUM: 3.1 mmol/L — AB (ref 3.5–5.1)

## 2017-03-15 LAB — SURGICAL PCR SCREEN
MRSA, PCR: NEGATIVE
STAPHYLOCOCCUS AUREUS: NEGATIVE

## 2017-03-15 MED ORDER — CHLORHEXIDINE GLUCONATE CLOTH 2 % EX PADS
6.0000 | MEDICATED_PAD | Freq: Once | CUTANEOUS | Status: DC
  Filled 2017-03-15: qty 6

## 2017-03-15 NOTE — Telephone Encounter (Signed)
Called patient back and he wanted me to confirm what he needed to do before his surgery date. It was explained in details. Patient understood and had no further questions.

## 2017-03-15 NOTE — Telephone Encounter (Signed)
Patients calling has a couple questions about his surgery and needs someone to give him a call.please call patient and advice.

## 2017-03-15 NOTE — Telephone Encounter (Signed)
Mardene Celeste called from [facility] stating the patient is requesting an extension on home health until surgery next week.  Verbal order provided at this time.

## 2017-03-15 NOTE — Pre-Procedure Instructions (Signed)
Call and talked with Dr Marcello Moores regarding anesthesia consult due to recent fall.  Patient states "I fell taking off my underwear, I threw up my leg to throw my underwear in the air and fell in the floor."  "OK, I don't need to see him." per Dr Marcello Moores.

## 2017-03-15 NOTE — Pre-Procedure Instructions (Signed)
Potassium 3.1 results faxed to office with request for potassium supplement.

## 2017-03-15 NOTE — Patient Instructions (Signed)
Your procedure is scheduled on: 03/21/17 Tues Report to Same Day Surgery 2nd floor medical mall [facility] Entrance-take elevator on left to 2nd floor.  Check in with surgery information desk.) To find out your arrival time please call [phone] between 1PM - 3PM on 03/20/17 mon  Remember: Instructions that are not followed completely may result in serious medical risk, up to and including death, or upon the discretion of your surgeon and anesthesiologist your surgery may need to be rescheduled.    _x___ 1. Do not eat food after midnight the night before your procedure. You may drink clear liquids up to 2 hours before you are scheduled to arrive at the hospital for your procedure.  Do not drink clear liquids within 2 hours of your scheduled arrival to the hospital.  Clear liquids include  --Water or Apple juice without pulp  --Clear carbohydrate beverage such as ClearFast or Gatorade  --Black Coffee or Clear Tea (No milk, no creamers, do not add anything to                  the coffee or Tea Type 1 and type 2 diabetics should only drink water.  No gum chewing or hard candies.     __x__ 2. No Alcohol for 24 hours before or after surgery.   __x__3. No Smoking for 24 prior to surgery.   ____  4. Bring all medications with you on the day of surgery if instructed.    __x__ 5. Notify your doctor if there is any change in your medical condition     (cold, fever, infections).     Do not wear jewelry, make-up, hairpins, clips or nail polish.  Do not wear lotions, powders, or perfumes. You may wear deodorant.  Do not shave 48 hours prior to surgery. Men may shave face and neck.  Do not bring valuables to the hospital.    [facility] is not responsible for any belongings or valuables.               Contacts, dentures or bridgework may not be worn into surgery.  Leave your suitcase in the car. After surgery it may be brought to your room.  For patients admitted to the hospital, discharge  time is determined by your                       treatment team.   Patients discharged the day of surgery will not be allowed to drive home.  You will need someone to drive you home and stay with you the night of your procedure.    Please read over the following fact sheets that you were given:   [facility] Preparing for Surgery and or MRSA Information   _x___ Take anti-hypertensive listed below, cardiac, seizure, asthma,     anti-reflux and psychiatric medicines. These include:  1. gabapentin (NEURONTIN) 400 MG capsule  2.metoprolol succinate (TOPROL-XL) 25 MG 24 hr tablet  3.omeprazole (PRILOSEC) 20 MG capsule  4.  5.  6.  ____Fleets enema or Magnesium Citrate as directed.   _x___ Use CHG Soap or sage wipes as directed on instruction sheet   ____ Use inhalers on the day of surgery and bring to hospital day of surgery  ____ Stop Metformin and Janumet 2 days prior to surgery.    ____ Take 1/2 of usual insulin dose the night before surgery and none on the morning     surgery.   _x___ Follow  recommendations from Cardiologist, Pulmonologist or PCP regarding          stopping Aspirin, Coumadin, Plavix ,Eliquis, Effient, or Pradaxa, and Pletal.  X____Stop Anti-inflammatories such as Advil, Aleve, Ibuprofen, Motrin, Naproxen, Naprosyn, Goodies powders or aspirin products. OK to take Tylenol and                          Celebrex.   _x___ Stop supplements until after surgery.  But may continue Vitamin D, Vitamin B,       and multivitamin.   ____ Bring C-Pap to the hospital.

## 2017-03-20 ENCOUNTER — Other Ambulatory Visit: Payer: Self-pay

## 2017-03-20 MED ORDER — CLINDAMYCIN PHOSPHATE 900 MG/50ML IV SOLN: 900.0000 mg | INTRAVENOUS | Status: DC

## 2017-03-20 MED ORDER — GENTAMICIN SULFATE 40 MG/ML IJ SOLN
5.0000 mg/kg | INTRAVENOUS | Status: DC
  Filled 2017-03-20: qty 11.25

## 2017-03-20 MED ORDER — POTASSIUM CHLORIDE CRYS ER 20 MEQ PO TBCR: 20.0000 meq | EXTENDED_RELEASE_TABLET | Freq: Two times a day (BID) | ORAL | 0 refills | Status: DC

## 2017-03-20 NOTE — Telephone Encounter (Signed)
Call made to patient at this time. Verbalized to patient that he will need to pick up his potassium supplements at his pharmacy due to his lab work from his pre-admit appointment. I also advised patient to increase his intake of potassium rich foods today as well. Patient verbalized understanding.

## 2017-03-21 ENCOUNTER — Encounter: Payer: Self-pay | Admitting: *Deleted

## 2017-03-21 ENCOUNTER — Other Ambulatory Visit: Payer: Self-pay

## 2017-03-21 ENCOUNTER — Encounter
Admission: RE | Disposition: A | Discharge status: Home Health | Payer: Self-pay | Source: Ambulatory Visit | Attending: General Surgery

## 2017-03-21 ENCOUNTER — Inpatient Hospital Stay: Payer: Medicare Other | Admitting: Anesthesiology

## 2017-03-21 ENCOUNTER — Inpatient Hospital Stay: Payer: Medicare Other

## 2017-03-21 ENCOUNTER — Inpatient Hospital Stay
Admission: RE | Admit: 2017-03-21 | Discharge: 2017-03-25 | DRG: 331 | Disposition: A | Discharge status: Home Health | Payer: Medicare Other | Source: Ambulatory Visit | Attending: General Surgery | Admitting: General Surgery

## 2017-03-21 DIAGNOSIS — F418 Other specified anxiety disorders: Secondary | ICD-10-CM | POA: Diagnosis present

## 2017-03-21 DIAGNOSIS — N400 Enlarged prostate without lower urinary tract symptoms: Secondary | ICD-10-CM | POA: Diagnosis present

## 2017-03-21 DIAGNOSIS — F419 Anxiety disorder, unspecified: Secondary | ICD-10-CM | POA: Diagnosis not present

## 2017-03-21 DIAGNOSIS — E785 Hyperlipidemia, unspecified: Secondary | ICD-10-CM | POA: Diagnosis present

## 2017-03-21 DIAGNOSIS — I4891 Unspecified atrial fibrillation: Secondary | ICD-10-CM | POA: Diagnosis present

## 2017-03-21 DIAGNOSIS — K219 Gastro-esophageal reflux disease without esophagitis: Secondary | ICD-10-CM | POA: Diagnosis not present

## 2017-03-21 DIAGNOSIS — Z96651 Presence of right artificial knee joint: Secondary | ICD-10-CM | POA: Diagnosis present

## 2017-03-21 DIAGNOSIS — Z86718 Personal history of other venous thrombosis and embolism: Secondary | ICD-10-CM

## 2017-03-21 DIAGNOSIS — Z9889 Other specified postprocedural states: Secondary | ICD-10-CM | POA: Diagnosis not present

## 2017-03-21 DIAGNOSIS — K589 Irritable bowel syndrome without diarrhea: Secondary | ICD-10-CM | POA: Diagnosis present

## 2017-03-21 DIAGNOSIS — Z433 Encounter for attention to colostomy: Principal | ICD-10-CM

## 2017-03-21 DIAGNOSIS — F17220 Nicotine dependence, chewing tobacco, uncomplicated: Secondary | ICD-10-CM | POA: Diagnosis present

## 2017-03-21 DIAGNOSIS — F329 Major depressive disorder, single episode, unspecified: Secondary | ICD-10-CM | POA: Diagnosis not present

## 2017-03-21 DIAGNOSIS — Z7689 Persons encountering health services in other specified circumstances: Secondary | ICD-10-CM | POA: Diagnosis not present

## 2017-03-21 DIAGNOSIS — J449 Chronic obstructive pulmonary disease, unspecified: Secondary | ICD-10-CM | POA: Diagnosis present

## 2017-03-21 DIAGNOSIS — Z408 Encounter for other prophylactic surgery: Secondary | ICD-10-CM | POA: Diagnosis not present

## 2017-03-21 DIAGNOSIS — Z923 Personal history of irradiation: Secondary | ICD-10-CM | POA: Diagnosis not present

## 2017-03-21 DIAGNOSIS — I10 Essential (primary) hypertension: Secondary | ICD-10-CM | POA: Diagnosis present

## 2017-03-21 DIAGNOSIS — Z8546 Personal history of malignant neoplasm of prostate: Secondary | ICD-10-CM | POA: Diagnosis not present

## 2017-03-21 DIAGNOSIS — Z933 Colostomy status: Secondary | ICD-10-CM | POA: Diagnosis not present

## 2017-03-21 HISTORY — PX: FLEXIBLE SIGMOIDOSCOPY: SHX5431

## 2017-03-21 HISTORY — PX: COLOSTOMY REVERSAL: SHX5782

## 2017-03-21 HISTORY — PX: COLOSTOMY TAKEDOWN: SHX5258

## 2017-03-21 HISTORY — PX: CYSTOSCOPY WITH STENT PLACEMENT: SHX5790

## 2017-03-21 LAB — CREATININE, SERUM
CREATININE: 1.44 mg/dL — AB (ref 0.61–1.24)
GFR, EST AFRICAN AMERICAN: 53 mL/min — AB (ref >60)
GFR, EST NON AFRICAN AMERICAN: 46 mL/min — AB (ref >60)

## 2017-03-21 LAB — POCT I-STAT 4, (NA,K, GLUC, HGB,HCT)
Glucose, Bld: 99 mg/dL (ref 65–99)
HCT: 34 % — ABNORMAL LOW (ref 39.0–52.0)
HEMOGLOBIN: 11.6 g/dL — AB (ref 13.0–17.0)
POTASSIUM: 3.3 mmol/L — AB (ref 3.5–5.1)
Sodium: 139 mmol/L (ref 135–145)

## 2017-03-21 LAB — CBC
HEMATOCRIT: 37 % — AB (ref 40.0–52.0)
HEMOGLOBIN: 12.1 g/dL — AB (ref 13.0–18.0)
MCH: 28.4 pg (ref 26.0–34.0)
MCHC: 32.8 g/dL (ref 32.0–36.0)
MCV: 86.6 fL (ref 80.0–100.0)
Platelets: 272 10*3/uL (ref 150–440)
RBC: 4.27 MIL/uL — ABNORMAL LOW (ref 4.40–5.90)
RDW: 14.9 % — AB (ref 11.5–14.5)
WBC: 14.8 10*3/uL — AB (ref 3.8–10.6)

## 2017-03-21 SURGERY — CLOSURE, COLOSTOMY, LAPAROSCOPIC
Anesthesia: General | Site: Ureter | Wound class: Clean Contaminated

## 2017-03-21 MED ORDER — DIPHENHYDRAMINE HCL 12.5 MG/5ML PO ELIX
12.5000 mg | ORAL_SOLUTION | Freq: Four times a day (QID) | ORAL | Status: DC | PRN
  Filled 2017-03-21: qty 5

## 2017-03-21 MED ORDER — LIDOCAINE HCL (PF) 2 % IJ SOLN
INTRAMUSCULAR | Status: AC
  Filled 2017-03-21: qty 10

## 2017-03-21 MED ORDER — ONDANSETRON HCL 4 MG/2ML IJ SOLN
INTRAMUSCULAR | Status: DC | PRN
  Administered 2017-03-21: 4 mg via INTRAVENOUS

## 2017-03-21 MED ORDER — GENTAMICIN SULFATE 40 MG/ML IJ SOLN
350.0000 mg | INTRAMUSCULAR | Status: AC
  Administered 2017-03-21: 350 mg via INTRAVENOUS
  Filled 2017-03-21: qty 8.75

## 2017-03-21 MED ORDER — EPHEDRINE SULFATE 50 MG/ML IJ SOLN
INTRAMUSCULAR | Status: DC | PRN
  Administered 2017-03-21: 10 mg via INTRAVENOUS

## 2017-03-21 MED ORDER — CLINDAMYCIN PHOSPHATE 900 MG/50ML IV SOLN
900.0000 mg | INTRAVENOUS | Status: AC
  Administered 2017-03-21: 900 mg via INTRAVENOUS

## 2017-03-21 MED ORDER — PHENYLEPHRINE HCL 10 MG/ML IJ SOLN
INTRAMUSCULAR | Status: AC
  Filled 2017-03-21: qty 1

## 2017-03-21 MED ORDER — FENTANYL CITRATE (PF) 100 MCG/2ML IJ SOLN
INTRAMUSCULAR | Status: AC
  Administered 2017-03-21: 25 ug via INTRAVENOUS
  Filled 2017-03-21: qty 2

## 2017-03-21 MED ORDER — ACETAMINOPHEN 10 MG/ML IV SOLN
INTRAVENOUS | Status: AC
  Filled 2017-03-21: qty 100

## 2017-03-21 MED ORDER — ALVIMOPAN 12 MG PO CAPS
12.0000 mg | ORAL_CAPSULE | ORAL | Status: AC
  Administered 2017-03-21: 12 mg via ORAL

## 2017-03-21 MED ORDER — GLYCOPYRROLATE 0.2 MG/ML IJ SOLN
INTRAMUSCULAR | Status: AC
  Filled 2017-03-21: qty 1

## 2017-03-21 MED ORDER — ROCURONIUM BROMIDE 50 MG/5ML IV SOLN
INTRAVENOUS | Status: AC
  Filled 2017-03-21: qty 1

## 2017-03-21 MED ORDER — ONDANSETRON HCL 4 MG/2ML IJ SOLN: 4.0000 mg | Freq: Four times a day (QID) | INTRAMUSCULAR | Status: DC | PRN

## 2017-03-21 MED ORDER — PANTOPRAZOLE SODIUM 40 MG IV SOLR
40.0000 mg | Freq: Every day | INTRAVENOUS | Status: DC
  Administered 2017-03-21 – 2017-03-24 (×4): 40 mg via INTRAVENOUS
  Filled 2017-03-21 (×4): qty 40

## 2017-03-21 MED ORDER — ONDANSETRON 4 MG PO TBDP
4.0000 mg | ORAL_TABLET | Freq: Four times a day (QID) | ORAL | Status: DC | PRN
  Administered 2017-03-21: 4 mg via ORAL
  Filled 2017-03-21: qty 1

## 2017-03-21 MED ORDER — FENTANYL CITRATE (PF) 250 MCG/5ML IJ SOLN
INTRAMUSCULAR | Status: AC
  Filled 2017-03-21: qty 5

## 2017-03-21 MED ORDER — LIDOCAINE HCL (CARDIAC) 20 MG/ML IV SOLN
INTRAVENOUS | Status: DC | PRN
  Administered 2017-03-21: 80 mg via INTRAVENOUS

## 2017-03-21 MED ORDER — SUCCINYLCHOLINE CHLORIDE 20 MG/ML IJ SOLN
INTRAMUSCULAR | Status: AC
  Filled 2017-03-21: qty 1

## 2017-03-21 MED ORDER — MORPHINE SULFATE (PF) 4 MG/ML IV SOLN
4.0000 mg | INTRAVENOUS | Status: DC | PRN
  Administered 2017-03-21 – 2017-03-24 (×9): 4 mg via INTRAVENOUS
  Filled 2017-03-21 (×9): qty 1

## 2017-03-21 MED ORDER — PROPOFOL 10 MG/ML IV BOLUS
INTRAVENOUS | Status: DC | PRN
  Administered 2017-03-21: 160 mg via INTRAVENOUS

## 2017-03-21 MED ORDER — MINERAL OIL LIGHT 100 % EX OIL
TOPICAL_OIL | CUTANEOUS | Status: AC
  Filled 2017-03-21: qty 25

## 2017-03-21 MED ORDER — SUGAMMADEX SODIUM 200 MG/2ML IV SOLN
INTRAVENOUS | Status: DC | PRN
  Administered 2017-03-21: 177 mg via INTRAVENOUS

## 2017-03-21 MED ORDER — ROCURONIUM BROMIDE 100 MG/10ML IV SOLN
INTRAVENOUS | Status: DC | PRN
  Administered 2017-03-21: 20 mg via INTRAVENOUS
  Administered 2017-03-21: 10 mg via INTRAVENOUS
  Administered 2017-03-21: 20 mg via INTRAVENOUS
  Administered 2017-03-21: 30 mg via INTRAVENOUS
  Administered 2017-03-21: 10 mg via INTRAVENOUS
  Administered 2017-03-21 (×2): 20 mg via INTRAVENOUS
  Administered 2017-03-21: 5 mg via INTRAVENOUS
  Administered 2017-03-21 (×3): 10 mg via INTRAVENOUS

## 2017-03-21 MED ORDER — CLINDAMYCIN PHOSPHATE 900 MG/50ML IV SOLN
INTRAVENOUS | Status: AC
  Filled 2017-03-21: qty 50

## 2017-03-21 MED ORDER — HYDRALAZINE HCL 20 MG/ML IJ SOLN: 10.0000 mg | INTRAMUSCULAR | Status: DC | PRN

## 2017-03-21 MED ORDER — FENTANYL CITRATE (PF) 100 MCG/2ML IJ SOLN
INTRAMUSCULAR | Status: AC
  Filled 2017-03-21: qty 2

## 2017-03-21 MED ORDER — EPHEDRINE SULFATE 50 MG/ML IJ SOLN
INTRAMUSCULAR | Status: AC
  Filled 2017-03-21: qty 1

## 2017-03-21 MED ORDER — PROPOFOL 10 MG/ML IV BOLUS
INTRAVENOUS | Status: AC
  Filled 2017-03-21: qty 20

## 2017-03-21 MED ORDER — ALVIMOPAN 12 MG PO CAPS
ORAL_CAPSULE | ORAL | Status: AC
  Administered 2017-03-21: 12 mg via ORAL
  Filled 2017-03-21: qty 1

## 2017-03-21 MED ORDER — ACETAMINOPHEN 10 MG/ML IV SOLN
INTRAVENOUS | Status: DC | PRN
  Administered 2017-03-21: 1000 mg via INTRAVENOUS

## 2017-03-21 MED ORDER — FENTANYL CITRATE (PF) 100 MCG/2ML IJ SOLN
25.0000 ug | INTRAMUSCULAR | Status: DC | PRN
  Administered 2017-03-21 (×4): 25 ug via INTRAVENOUS

## 2017-03-21 MED ORDER — BUPIVACAINE-EPINEPHRINE (PF) 0.5% -1:200000 IJ SOLN
INTRAMUSCULAR | Status: AC
  Filled 2017-03-21: qty 30

## 2017-03-21 MED ORDER — LIDOCAINE HCL (PF) 1 % IJ SOLN
INTRAMUSCULAR | Status: AC
  Filled 2017-03-21: qty 30

## 2017-03-21 MED ORDER — ENOXAPARIN SODIUM 40 MG/0.4ML ~~LOC~~ SOLN
40.0000 mg | SUBCUTANEOUS | Status: DC
  Administered 2017-03-22 – 2017-03-25 (×4): 40 mg via SUBCUTANEOUS
  Filled 2017-03-21 (×4): qty 0.4

## 2017-03-21 MED ORDER — SUCCINYLCHOLINE CHLORIDE 20 MG/ML IJ SOLN
INTRAMUSCULAR | Status: DC | PRN
  Administered 2017-03-21: 100 mg via INTRAVENOUS

## 2017-03-21 MED ORDER — FENTANYL CITRATE (PF) 100 MCG/2ML IJ SOLN
INTRAMUSCULAR | Status: DC | PRN
  Administered 2017-03-21 (×5): 50 ug via INTRAVENOUS

## 2017-03-21 MED ORDER — LACTATED RINGERS IV SOLN
INTRAVENOUS | Status: DC
  Administered 2017-03-21 – 2017-03-23 (×6): via INTRAVENOUS

## 2017-03-21 MED ORDER — DEXAMETHASONE SODIUM PHOSPHATE 10 MG/ML IJ SOLN
INTRAMUSCULAR | Status: DC | PRN
  Administered 2017-03-21: 10 mg via INTRAVENOUS

## 2017-03-21 MED ORDER — LACTATED RINGERS IV SOLN
INTRAVENOUS | Status: DC
  Administered 2017-03-21 (×3): via INTRAVENOUS

## 2017-03-21 MED ORDER — ONDANSETRON HCL 4 MG/2ML IJ SOLN: 4.0000 mg | Freq: Once | INTRAMUSCULAR | Status: DC | PRN

## 2017-03-21 MED ORDER — DIPHENHYDRAMINE HCL 50 MG/ML IJ SOLN: 12.5000 mg | Freq: Four times a day (QID) | INTRAMUSCULAR | Status: DC | PRN

## 2017-03-21 MED ORDER — BUPIVACAINE HCL (PF) 0.5 % IJ SOLN
INTRAMUSCULAR | Status: AC
  Filled 2017-03-21: qty 30

## 2017-03-21 SURGICAL SUPPLY — 91 items
ADHESIVE MASTISOL STRL (MISCELLANEOUS) IMPLANT
APPLIER CLIP LOGIC TI 5 (MISCELLANEOUS) ×4 IMPLANT
BAG DRAIN CYSTO-URO LG1000N (MISCELLANEOUS) ×4 IMPLANT
BAG URINE DRAINAGE (UROLOGICAL SUPPLIES) IMPLANT
BLADE SURG SZ10 CARB STEEL (BLADE) ×4 IMPLANT
BRUSH SCRUB EZ  4% CHG (MISCELLANEOUS)
BRUSH SCRUB EZ 4% CHG (MISCELLANEOUS) IMPLANT
CANISTER SUCT 1200ML W/VALVE (MISCELLANEOUS) IMPLANT
CANISTER SUCT 3000ML (MISCELLANEOUS) ×8 IMPLANT
CATH COUDE FOLEY 2W 5CC 16FR (CATHETERS) ×4 IMPLANT
CATH URETL 5X70 OPEN END (CATHETERS) ×4 IMPLANT
CHLORAPREP W/TINT 26ML (MISCELLANEOUS) ×4 IMPLANT
CLEANER CAUTERY TIP 5X5 PAD (MISCELLANEOUS) ×3 IMPLANT
CLIP VESOCCLUDE LG 6/CT (CLIP) IMPLANT
CLIP VESOCCLUDE MED 6/CT (CLIP) IMPLANT
CONRAY 43 FOR UROLOGY 50M (MISCELLANEOUS) IMPLANT
DEFOGGER SCOPE WARMER CLEARIFY (MISCELLANEOUS) ×4 IMPLANT
DRAPE LEGGINS SURG 28X43 STRL (DRAPES) ×4 IMPLANT
DRSG OPSITE POSTOP 3X4 (GAUZE/BANDAGES/DRESSINGS) ×8 IMPLANT
ELECT BLADE 6.5 EXT (BLADE) ×4 IMPLANT
ELECT REM PT RETURN 9FT ADLT (ELECTROSURGICAL) ×4
ELECTRODE REM PT RTRN 9FT ADLT (ELECTROSURGICAL) ×3 IMPLANT
GLOVE BIO SURGEON STRL SZ7.5 (GLOVE) ×8 IMPLANT
GLOVE BIOGEL PI IND STRL 8 (GLOVE) ×3 IMPLANT
GLOVE BIOGEL PI INDICATOR 8 (GLOVE) ×1
GOWN STANDARD XL  REUSABL (MISCELLANEOUS) ×4 IMPLANT
GOWN STRL REUS W/ TWL LRG LVL3 (GOWN DISPOSABLE) ×18 IMPLANT
GOWN STRL REUS W/TWL LRG LVL3 (GOWN DISPOSABLE) ×6
HANDLE YANKAUER SUCT BULB TIP (MISCELLANEOUS) ×4 IMPLANT
IRRIGATION STRYKERFLOW (MISCELLANEOUS) ×3 IMPLANT
IRRIGATOR STRYKERFLOW (MISCELLANEOUS) ×4
IV NS 1000ML (IV SOLUTION) ×1
IV NS 1000ML BAXH (IV SOLUTION) ×3 IMPLANT
IV SOD CHL 0.9% 1000ML (IV SOLUTION) ×8 IMPLANT
IV SODIUM CHL 0.9% 500ML (IV SOLUTION) ×4 IMPLANT
KIT RM TURNOVER CYSTO AR (KITS) IMPLANT
KIT RM TURNOVER STRD PROC AR (KITS) ×4 IMPLANT
KIT URETTERAL LIGHTED STENTS (STENTS) ×4 IMPLANT
LABEL OR SOLS (LABEL) ×4 IMPLANT
LIGASURE VESSEL 5MM BLUNT TIP (ELECTROSURGICAL) ×4 IMPLANT
NEEDLE HYPO 25X1 1.5 SAFETY (NEEDLE) ×4 IMPLANT
NEEDLE VERESS 14GA 120MM (NEEDLE) ×4 IMPLANT
NS IRRIG 1000ML POUR BTL (IV SOLUTION) IMPLANT
PACK COLON CLEAN CLOSURE (MISCELLANEOUS) ×4 IMPLANT
PACK CYSTO AR (MISCELLANEOUS) ×4 IMPLANT
PACK LAP CHOLECYSTECTOMY (MISCELLANEOUS) ×4 IMPLANT
PAD CLEANER CAUTERY TIP 5X5 (MISCELLANEOUS) ×1
PAD PREP 24X41 OB/GYN DISP (PERSONAL CARE ITEMS) ×4 IMPLANT
PENCIL ELECTRO HAND CTR (MISCELLANEOUS) ×4 IMPLANT
PROLENE SUTURE ×4 IMPLANT
RELOAD STAPLER BLUE 60MM (STAPLE) ×12 IMPLANT
RETRACTOR WOUND ALXS 18CM MED (MISCELLANEOUS) ×3 IMPLANT
RETRACTOR WOUND ALXS 18CM SML (MISCELLANEOUS) ×3 IMPLANT
RTRCTR WOUND ALEXIS O 18CM MED (MISCELLANEOUS) ×4
RTRCTR WOUND ALEXIS O 18CM SML (MISCELLANEOUS) ×4
SCISSORS METZENBAUM CVD 33 (INSTRUMENTS) IMPLANT
SENSORWIRE 0.038 NOT ANGLED (WIRE) ×4
SET CYSTO W/LG BORE CLAMP LF (SET/KITS/TRAYS/PACK) ×4 IMPLANT
SLEEVE ENDOPATH XCEL 5M (ENDOMECHANICALS) ×4 IMPLANT
SOL .9 NS 3000ML IRR  AL (IV SOLUTION) ×1
SOL .9 NS 3000ML IRR UROMATIC (IV SOLUTION) ×3 IMPLANT
SOL PREP PVP 2OZ (MISCELLANEOUS) ×8
SOLUTION PREP PVP 2OZ (MISCELLANEOUS) ×6 IMPLANT
SPONGE LAP 18X18 5 PK (GAUZE/BANDAGES/DRESSINGS) ×4 IMPLANT
STAPLER ECHELON LONG 60 440 (INSTRUMENTS) ×4 IMPLANT
STAPLER PROX 25M (MISCELLANEOUS) ×8 IMPLANT
STAPLER RELOAD BLUE 60MM (STAPLE) ×16
STAPLER SKIN PROX 35W (STAPLE) IMPLANT
STENT URET 6FRX24 CONTOUR (STENTS) IMPLANT
STENT URET 6FRX26 CONTOUR (STENTS) IMPLANT
STRIP CLOSURE SKIN 1/2X4 (GAUZE/BANDAGES/DRESSINGS) IMPLANT
SURGILUBE 2OZ TUBE FLIPTOP (MISCELLANEOUS) ×8 IMPLANT
SUT MNCRL 4-0 (SUTURE)
SUT MNCRL 4-0 27XMFL (SUTURE)
SUT PROLENE 3 0 SH DA (SUTURE) ×8 IMPLANT
SUT SILK 3-0 (SUTURE) ×1
SUT SILK 3-0 SH-1 18XCR BRD (SUTURE) ×3
SUT VIC AB 2-0 CT1 27 (SUTURE)
SUT VIC AB 2-0 CT1 TAPERPNT 27 (SUTURE) IMPLANT
SUTURE MNCRL 4-0 27XMF (SUTURE) IMPLANT
SUTURE SILK 3-0 SH-1 18XCR BRD (SUTURE) ×3 IMPLANT
SYRINGE IRR TOOMEY STRL 70CC (SYRINGE) IMPLANT
TOWEL OR 17X26 4PK STRL BLUE (TOWEL DISPOSABLE) ×8 IMPLANT
TRAY FOLEY W/METER SILVER 16FR (SET/KITS/TRAYS/PACK) IMPLANT
TROCAR XCEL 12X100 BLDLESS (ENDOMECHANICALS) ×4 IMPLANT
TROCAR XCEL NON-BLD 11X100MML (ENDOMECHANICALS) ×4 IMPLANT
TROCAR XCEL NON-BLD 5MMX100MML (ENDOMECHANICALS) ×4 IMPLANT
TUBING INSUF HEATED (TUBING) ×4 IMPLANT
WATER STERILE IRR 1000ML POUR (IV SOLUTION) IMPLANT
WATER STERILE IRR 500ML POUR (IV SOLUTION) ×4 IMPLANT
WIRE SENSOR 0.038 NOT ANGLED (WIRE) ×3 IMPLANT

## 2017-03-21 NOTE — Progress Notes (Signed)
Patient for laparoscopic colostomy takedown and placement of bilateral lighted ureteral stents has been requested.  The procedure was discussed with patient including potential risks of bleeding, infection and ureteral injury.  All questions were answered and he desires to proceed.

## 2017-03-21 NOTE — Progress Notes (Signed)
Gentamicin ordered for surgical prophylaxis. No pharmacy consult ordered, but order for gent was placed for total body weight (88.5 kg). Adjusted gentamicin to 350 mg IV x 1 (5 mg/kg of ideal 71 kg).  Will administer gentamicin 350 mg IV x 1 for surgical prophylaxis based on ideal body weight.  Tobie Lords, PharmD, BCPS Clinical Pharmacist 03/21/2017

## 2017-03-21 NOTE — Brief Op Note (Signed)
03/21/2017  1:28 PM  PATIENT:  Cameron Foster.  76 y.o. male  PRE-OPERATIVE DIAGNOSIS:  COLOSTOMY IN PLACE  POST-OPERATIVE DIAGNOSIS:  COLOSTOMY IN PLACE  PROCEDURE:  Procedure(s): LAPAROSCOPIC COLOSTOMY TAKEDOWN (N/A) COLOSTOMY REVERSAL (N/A) CYSTOSCOPY WITH LIGHTED STENT PLACEMENT (Bilateral) FLEXIBLE SIGMOIDOSCOPY (N/A)  SURGEON:  Surgeon(s) and Role: Panel 1:    * Clayburn Pert, MD - Primary    * Nestor Lewandowsky, MD - Assisting Panel 2:    * Stoioff, Ronda Fairly, MD - Primary  PHYSICIAN ASSISTANT:   ASSISTANTS: none   ANESTHESIA:   general  EBL:  100 mL   BLOOD ADMINISTERED:none  DRAINS: none   LOCAL MEDICATIONS USED:  MARCAINE   , LIDOCAINE  and Amount: 10 ml  SPECIMEN:  Source of Specimen:  colostomy and anastamotic doughnuts  DISPOSITION OF SPECIMEN:  PATHOLOGY  COUNTS:  YES  TOURNIQUET:  * No tourniquets in log *  DICTATION: .Dragon Dictation  PLAN OF CARE: Admit to inpatient   PATIENT DISPOSITION:  PACU - hemodynamically stable.   Delay start of Pharmacological VTE agent (>24hrs) due to surgical blood loss or risk of bleeding: no

## 2017-03-21 NOTE — Anesthesia Post-op Follow-up Note (Signed)
Anesthesia QCDR form completed.        

## 2017-03-21 NOTE — Anesthesia Postprocedure Evaluation (Signed)
Anesthesia Post Note  Patient: Cameron Foster.  Procedure(s) Performed: LAPAROSCOPIC COLOSTOMY TAKEDOWN (N/A Abdomen) COLOSTOMY REVERSAL (N/A Abdomen) FLEXIBLE SIGMOIDOSCOPY (N/A Rectum) CYSTOSCOPY WITH LIGHTED STENT PLACEMENT (Bilateral Ureter)  Patient location during evaluation: PACU Anesthesia Type: General Level of consciousness: awake Pain management: pain level controlled Vital Signs Assessment: post-procedure vital signs reviewed and stable Respiratory status: spontaneous breathing Cardiovascular status: stable Anesthetic complications: no     Last Vitals:  Vitals:   03/21/17 1439 03/21/17 1450  BP: 123/83   Pulse: 72 77  Resp: 11 17  Temp:    SpO2: 94% 97%    Last Pain:  Vitals:   03/21/17 1450  TempSrc:   PainSc: 6                  VAN STAVEREN,Priti Consoli

## 2017-03-21 NOTE — Op Note (Signed)
Pre-operative Diagnosis: End colostomy in place  Post-operative Diagnosis: Same  Procedure performed: Laparoscopic colostomy reversal, flexible sigmoidoscopy, anastomotic revision  Surgeon: Clayburn Pert   Assistants: Dr. Nestor Lewandowsky  Anesthesia: General endotracheal anesthesia  ASA Class: 2  Surgeon: Clayburn Pert, MD FACS  Anesthesia: Gen. with endotracheal tube  Assistant: Dr. Genevive Bi  Procedure Details  The patient was seen again in the Holding Room. The benefits, complications, treatment options, and expected outcomes were discussed with the patient. The risks of bleeding, infection, recurrence of symptoms, failure to resolve symptoms,  bowel injury, any of which could require further surgery were reviewed with the patient.   The patient was taken to Operating Room, identified as Cameron Foster. and the procedure verified.  A Time Out was held and the above information confirmed.  Prior to the induction of general anesthesia, antibiotic prophylaxis was administered. VTE prophylaxis was in place. General endotracheal anesthesia was then administered and tolerated well. After the induction, the abdomen was prepped with Chloraprep and draped in the sterile fashion. The patient was positioned in the supine position.  The procedure began with the right upper quadrant Veress needle approach.  The skin to finger breaths below the costal margin was localized with a 50-50 mixture 1% lidocaine 0.5% Marcaine plain and then incised with a 15 blade scalpel.  A Veress needle was inserted in the peritoneal cavity and a pneumoperitoneum was established to 15 mmHg without any difficulty.  A 5 mm Optiview trocar was placed through the incision into the peritoneal cavity without any difficulty.  There is no evidence of damage to the deep or surrounding structures from either the needle or the Optiview trocar.  Extensive midline and pelvic adhesions were identified after entering the abdomen.   However the right hemiabdomen was free of any adhesive disease and additional 5 mm trochars were placed in the right flank under direct visualization after localization with the aforementioned local.  Using accommodation of blunt dissection and LigaSure device the adhesions to the midline were taken down carefully.  The bowel was identified and sharply dissected free from any of its inflammatory attachments.  The majority of the adhesions came down easily and did not require any cautery.  We were able to completely dissect off the anterior abdominal wall and all the attachments around the end colostomy in the left upper quadrant.  The patient was then placed Trendelenburg and additional infraumbilical trocar was placed of the 5 mm variety.  The left lower quadrant 5 mm trocar was upgraded to an 11 mm trocar and we proceeded to dissected out the pelvis.  The majority of the adhesions were noted be flimsy and were able to be lysed with blunt dissection or sharp dissection with the LigaSure device without firing the bipolar.  The Prolene sutures that were placed on the staple line from the previous operation were easily identified and the rectal stump was able to be cleared off circumferentially from all of its attachments with ease.  We then proceeded to localize the skin around the colostomy site and then the skin was cut an ellipse around the colostomy with a 10 blade scalpel.  Using Bovie electrocautery it was circumferentially taken down to the level of the fascia and the fascia was entered into bluntly allowing the colon to be protected while it was freed follows attachments.  The colon was then able to be returned to the peritoneal cavity and a small Alexis wound protector was placed into the prior ostomy  site which was able to be clamped for pneumoperitoneum to be reestablished.  We then returned to the laparoscopic procedure and the remaining attachments from the colon to the lateral sidewall were  taken down with LigaSure device.  Immediately it was identified that additional dissection was required to provide adequate length for the colon to reach the pelvis.  The Garzon line of Toldt was taken down with LigaSure device up to the level of the splenic flexure but the splenic flexure was left intact.  This allowed more than adequate length for the colon to reach the pelvis without any undue tension.  The proximal colon was then retrieved through the wound protector.  The colon was then sharply dissected at the level below where it had traversed the fascia.  It was made hemostatic with electrocautery.  Using EEA sizers it was found to only accept a 25 mm sizer.  A 3-0 Prolene suture was used to create a pursestring suture and the anvil was placed into the distal colon prior to being returned to the peritoneal cavity.  We then reestablished our pneumoperitoneum and saw that the anvil easily reached the pelvis.  Dr. Genevive Bi then took a complete control of the intraperitoneal portion while I went to the rectum for the transrectal anastomosis.  First a digital rectal exam was performed followed by an EEA sizer which appeared to enter through the colon to our staple line without any difficulty.  However the stapler was unable to make it past the pelvic reflection.  We then brought in a cart for endoscopy and a flexible sigmoidoscopy was performed there was no obvious stricture and the staple line was able to be visualized from inside the lumen.  We then again used the EEA sizer was able to easily reach the staple line and the stapler was able to pass the pelvic reflection with the anvil being brought out through the anterior wall of the colon.  Dr. Faith Rogue was able to marry this with the anvil in the abdomen where the EEA stapler was closed and fired.  On the back table it was noted that the donuts were not intact and an endoscopy was repeated which showed that approximately one quarter of the anastomosis was open and  leaking into the peritoneal cavity.  At this point I re-scrubbed and returned to the abdominal cavity and after fully investigated the abdomen the decision was made that there was adequate length to repeat the resection and anastomosis.  Using a 60 mm blue load powered stapler the distal colon was resected below the aforementioned anastomosis.  This took 3 fires of the stapler but allowed the anastomosis to be fully resected.  This was then delivered through the aforementioned wound protector and the anastomosis was sharply excised from the colon.  We again meticulously obtained hemostasis.  A new pursestring suture was created using the pursestring device and a Keith needle 2-0 Prolene suture.  An additional 25 blue load EEA stapler was placed into the colon and secured with the Penrose stapler.  This was returned to the peritoneal cavity and a pneumoperitoneum was again obtained.  I then again returned to the rectal area and Dr. Faith Rogue again returned to the peritoneal area this time the EEA sizer very easily reach our staple line and we were able to bring up our stapler to the staple line without any difficulty.  There is additional inflammatory attachments to the posterior aspect of the staple line that was removed with the LigaSure device by  Dr. Faith Rogue.  I then advanced the spike and Dr. Genevive Bi married it with the anvil without any difficulty.  The stapler was fired and this time the staple line doughnuts were noted to be intact from both ends.  With an additional leak test was performed which did not show any evidence of leak from this anastomosis.  We proceeded to remove all the irrigation and return the patient to a neutral position.  All of her operative trochars were removed and hemostasis was ensured.  We then proceeded with a clean closure were all members of the operative team broke scrub and returned after re-scrubbing with clean gowns, gloves, drapes, instruments.  The prior ostomy site fascia was  closed with a running #1 PDS suture.  1/4 inch Penrose drain was placed into this wound and it was secured with surgical staples.  All the remaining laparoscopic trochars were closed with surgical staples.  Sterile dressings of honeycomb dressings were placed every operative site.  The patient tolerated the procedure well and was awoken from general endotracheal anesthesia in the operating room without any difficulty.  There were no immediate complications and all counts were correct at the end of the procedure.  Patient had bilateral ureteral stents placed by urology preoperatively and the right stent was removed in the operating room with plans for removing the left stent the first postoperative day.  Findings: And colostomy   Estimated Blood Loss: 200 mL         Drains: None         Specimens: Sigmoid colon          Complications: Anastomotic leak requiring revision                  Condition: Good   Clayburn Pert, MD, FACS

## 2017-03-21 NOTE — Anesthesia Procedure Notes (Signed)
Procedure Name: Intubation Performed by: Demetrius Charity, CRNA Pre-anesthesia Checklist: Patient identified, Patient being monitored, Timeout performed, Emergency Drugs available and Suction available Patient Re-evaluated:Patient Re-evaluated prior to induction Oxygen Delivery Method: Circle system utilized Preoxygenation: Pre-oxygenation with 100% oxygen Induction Type: IV induction Ventilation: Mask ventilation without difficulty Laryngoscope Size: Mac and 3 Grade View: Grade III Tube type: Oral Tube size: 7.5 mm Number of attempts: 1 Airway Equipment and Method: Stylet Placement Confirmation: ETT inserted through vocal cords under direct vision,  positive ETCO2 and breath sounds checked- equal and bilateral Secured at: 22 cm Tube secured with: Tape Dental Injury: Teeth and Oropharynx as per pre-operative assessment

## 2017-03-21 NOTE — Transfer of Care (Signed)
Immediate Anesthesia Transfer of Care Note  Patient: Cameron Foster.  Procedure(s) Performed: LAPAROSCOPIC COLOSTOMY TAKEDOWN (N/A Abdomen) COLOSTOMY REVERSAL (N/A Abdomen) FLEXIBLE SIGMOIDOSCOPY (N/A Rectum) CYSTOSCOPY WITH LIGHTED STENT PLACEMENT (Bilateral Ureter)  Patient Location: PACU  Anesthesia Type:General  Level of Consciousness: awake, alert  and oriented  Airway & Oxygen Therapy: Patient Spontanous Breathing and Patient connected to face mask oxygen  Post-op Assessment: Report given to RN and Post -op Vital signs reviewed and stable  Post vital signs: Reviewed and stable  Last Vitals:  Vitals:   03/21/17 0627  BP: 140/85  Pulse: 82  Resp: 20  Temp: 36.6 C  SpO2: 98%    Last Pain:  Vitals:   03/21/17 0627  TempSrc: Tympanic         Complications: No apparent anesthesia complications

## 2017-03-21 NOTE — Interval H&P Note (Signed)
History and Physical Interval Note:  03/21/2017 7:25 AM  Cameron Foster.  has presented today for surgery, with the diagnosis of COLOSTOMY IN PLACE  The various methods of treatment have been discussed with the patient and family. After consideration of risks, benefits and other options for treatment, the patient has consented to  Procedure(s): LAPAROSCOPIC COLOSTOMY TAKEDOWN (N/A) COLOSTOMY REVERSAL (N/A) CYSTOSCOPY WITH LIGHTED STENT PLACEMENT (Bilateral) as a surgical intervention .  The patient's history has been reviewed, patient examined, no change in status, stable for surgery.  I have reviewed the patient's chart and labs.  Questions were answered to the patient's satisfaction.     Clayburn Pert

## 2017-03-21 NOTE — Anesthesia Preprocedure Evaluation (Signed)
Anesthesia Evaluation  Patient identified by MRN, date of birth, ID band Patient awake    Reviewed: Allergy & Precautions, NPO status , Patient's Chart, lab work & pertinent test results  Airway Mallampati: II       Dental  (+) Teeth Intact, Missing   Pulmonary COPD, former smoker,     + decreased breath sounds      Cardiovascular Exercise Tolerance: Good hypertension, Pt. on home beta blockers and Pt. on medications + dysrhythmias Atrial Fibrillation  Rhythm:Regular Rate:Normal     Neuro/Psych Anxiety Depression    GI/Hepatic Neg liver ROS, GERD  Medicated,  Endo/Other  negative endocrine ROS  Renal/GU negative Renal ROS     Musculoskeletal   Abdominal Normal abdominal exam  (+)   Peds negative pediatric ROS (+)  Hematology negative hematology ROS (+)   Anesthesia Other Findings   Reproductive/Obstetrics                             Anesthesia Physical Anesthesia Plan  ASA: III  Anesthesia Plan: General   Post-op Pain Management:    Induction: Intravenous  PONV Risk Score and Plan: Ondansetron  Airway Management Planned: Oral ETT  Additional Equipment:   Intra-op Plan:   Post-operative Plan: Extubation in OR  Informed Consent: I have reviewed the patients History and Physical, chart, labs and discussed the procedure including the risks, benefits and alternatives for the proposed anesthesia with the patient or authorized representative who has indicated his/her understanding and acceptance.     Plan Discussed with: CRNA  Anesthesia Plan Comments:         Anesthesia Quick Evaluation

## 2017-03-21 NOTE — Progress Notes (Signed)
Patient asleep, no acute distress or resp distress noted. No signs of nausea of pain noted at this time.

## 2017-03-21 NOTE — Op Note (Addendum)
Date of procedure: 03/21/17  Preoperative diagnosis:  1. End colostomy in place  Postoperative diagnosis:  1. Same  Procedure:       1.  Cystoscopy with placement of bilateral lighted ureteral stents   Surgeon: John Giovanni, MD  Anesthesia: General  Complications: None  Intraoperative findings: Prostatic enlargement  EBL: None  Specimens: None  Drains: Lighted bilateral ureteral stent/Foley catheter  Indication: Cameron Foster. is a 76 y.o. patient scheduled for a laparoscopic colostomy takedown with and placement of bilateral ureteral stents have been requested preoperatively.  After reviewing the management options for treatment, he elected to proceed with the above surgical procedure(s). We have discussed the potential benefits and risks of the procedure, side effects of the proposed treatment, the likelihood of the patient achieving the goals of the procedure, and any potential problems that might occur during the procedure or recuperation. Informed consent has been obtained.  Description of procedure:  The patient was taken to the operating room and general anesthesia was induced.  The patient was placed in the dorsal lithotomy position, prepped and draped in the usual sterile fashion, and preoperative antibiotics were administered. A preoperative time-out was performed.   A 24 French cystoscope was lubricated and passed under direct vision.  The urethra was normal in caliber without stricture.  Prostate was remarkable for coapting lateral lobes and moderate bladder neck elevation.  The bladder mucosa was closely inspected and showed no erythema, solid or papillary lesions.  There was moderate bladder trabeculation present.  The ureteral orifice ease were normal appearing bilaterally.  A Cook outer ureteral catheter was placed through the cystoscope and positioned at the left ureteral orifice.  A 0.038 sensor wire was placed through the catheter and into the left ureteral  orifice and advanced up the left and pelvis with position verified by fluoroscopy.  The ureteral catheter was advanced to the 20 cm mark.  The cystoscope was removed and repassed and a right ureteral catheter was placed in a similar fashion.  The scope was then removed.  The fiberoptic unit was placed in each catheter and secured.  A 16 French coud catheter was placed and the ureteral catheters were secured to the Foley catheter.  The drainage bags were then placed through the ureteral catheters.  The patient was then prepped for his colostomy takedown which will be dictated separately.   John Giovanni, M.D.

## 2017-03-22 ENCOUNTER — Encounter: Payer: Self-pay | Admitting: General Surgery

## 2017-03-22 LAB — CBC
HCT: 32.5 % — ABNORMAL LOW (ref 40.0–52.0)
Hemoglobin: 11.1 g/dL — ABNORMAL LOW (ref 13.0–18.0)
MCH: 29.1 pg (ref 26.0–34.0)
MCHC: 34.1 g/dL (ref 32.0–36.0)
MCV: 85.4 fL (ref 80.0–100.0)
Platelets: 251 10*3/uL (ref 150–440)
RBC: 3.81 MIL/uL — ABNORMAL LOW (ref 4.40–5.90)
RDW: 14.5 % (ref 11.5–14.5)
WBC: 10.4 10*3/uL (ref 3.8–10.6)

## 2017-03-22 LAB — BASIC METABOLIC PANEL
Anion gap: 6 (ref 5–15)
BUN: 15 mg/dL (ref 6–20)
CO2: 22 mmol/L (ref 22–32)
Calcium: 8.6 mg/dL — ABNORMAL LOW (ref 8.9–10.3)
Chloride: 107 mmol/L (ref 101–111)
Creatinine, Ser: 1.27 mg/dL — ABNORMAL HIGH (ref 0.61–1.24)
GFR calc Af Amer: >60 mL/min (ref >60)
GFR, EST NON AFRICAN AMERICAN: 53 mL/min — AB (ref >60)
GLUCOSE: 119 mg/dL — AB (ref 65–99)
POTASSIUM: 3.9 mmol/L (ref 3.5–5.1)
Sodium: 135 mmol/L (ref 135–145)

## 2017-03-22 LAB — SURGICAL PATHOLOGY

## 2017-03-22 NOTE — Progress Notes (Signed)
1 Day Post-Op   Subjective: Patient is one day status post laparoscopic colostomy reversal.  Reports his abdomen is sore but otherwise is doing very well.  He was able to get out of bed to the chair last night.  He is tolerating clear liquids.  He denies any flatus currently.  Vital signs in last 24 hours: Temp:  [97.3 F (36.3 C)-98.6 F (37 C)] 98.6 F (37 C) (12/05 0443) Pulse Rate:  [72-80] 76 (12/05 0443) Resp:  [9-17] 17 (12/05 0443) BP: (117-141)/(74-95) 141/74 (12/05 0443) SpO2:  [91 %-100 %] 97 % (12/05 0443) Weight:  [88.5 kg (194 lb 16 oz)] 88.5 kg (194 lb 16 oz) (12/04 1726) Last BM Date: 03/21/17  Intake/Output from previous day: 12/04 0701 - 12/05 0700 In: 4106.7 [P.O.:680; I.V.:3426.7] Out: 1230 [Urine:1130; Blood:100]  GI: Abdomen soft, appropriately tender to palpation at the incision sites, nondistended.  Dressings in place to the laparoscopic incisions and prior ostomy site without any evidence of drainage or erythema.  Lab Results:  CBC Recent Labs    03/21/17 1627 03/22/17 0356  WBC 14.8* 10.4  HGB 12.1* 11.1*  HCT 37.0* 32.5*  PLT 272 251   CMP     Component Value Date/Time   NA 135 03/22/2017 0356   K 3.9 03/22/2017 0356   CL 107 03/22/2017 0356   CO2 22 03/22/2017 0356   GLUCOSE 119 (H) 03/22/2017 0356   BUN 15 03/22/2017 0356   CREATININE 1.27 (H) 03/22/2017 0356   CREATININE 1.40 (H) 08/12/2015 1632   CALCIUM 8.6 (L) 03/22/2017 0356   PROT 7.0 12/23/2016 1755   ALBUMIN 3.3 (L) 12/23/2016 1755   AST 17 12/23/2016 1755   ALT 11 (L) 12/23/2016 1755   ALKPHOS 69 12/23/2016 1755   BILITOT 0.5 12/23/2016 1755   GFRNONAA 53 (L) 03/22/2017 0356   GFRAA >60 03/22/2017 0356   PT/INR No results for input(s): LABPROT, INR in the last 72 hours.  Studies/Results: Dg C-arm 1-60 Min-no Report  Result Date: 03/21/2017 Fluoroscopy was utilized by the requesting physician.  No radiographic interpretation.    Assessment/Plan: 76 year old male  postop day #1 status post laparoscopic colostomy reversal.  Doing very well.  Last remaining ureteral stent removed at the bedside by me.  Discussed that the Foley catheter would stay until tomorrow.  Encourage patient to ambulate and use his incentive spirometer.  We will keep him on clear liquids until he has evidence of bowel function.   Clayburn Pert, MD [facility]  Day [facility] [phone] Night [facility] [phone]  03/22/2017

## 2017-03-23 NOTE — Evaluation (Addendum)
Physical Therapy Evaluation Patient Details Name: Cameron Foster. MRN: [id number] DOB: Feb 19, 1941 Today's Date: 03/23/2017   History of Present Illness  Pt admitted s/p laproscopic colostomy takedown.  PMH includes: vertigo, vasovagal syncope, Prostate CA, IBS, Htn, hyperlipidemia, blood clots, GERD, dysrhythmia, diverticulosis, dyspnea, depression, colon polyp, atrial fibrillation, asthma, arthritis and alcoholism.  Clinical Impression  Pt is a 76 year old male who lives in a one story home with his wife.  He was an independent community ambulator prior to being admitted to hospital.  Pt presented with general weakness of LE and required min assist to initiate bed mobility.  Bilateral LE sensation intact.  Pt also required min assist for STS transfer from bed and toilet.  Pt was able to ambulate 250 ft with RW which he reported needing due to fatigue and abdominal pain with movement.  Pt has a tendency to push the RW forward and lean on it for which the PT provided education. Pt will benefit from continued PT with focus on strength, tolerance to activity, functional mobility and safe use of DME education.  Pt will benefit from home health PT for continued strengthening and confirmation of carryover with education.    Follow Up Recommendations Home health PT    Equipment Recommendations       Recommendations for Other Services       Precautions / Restrictions Precautions Precautions: None Restrictions Weight Bearing Restrictions: No      Mobility  Bed Mobility Overal bed mobility: Needs Assistance Bed Mobility: Supine to Sit     Supine to sit: Min assist     General bed mobility comments: Required hand held assist for initiation of supine to sit due to abdominal pain.  Transfers Overall transfer level: Modified independent Equipment used: Rolling walker (2 wheeled)             General transfer comment: Pt required VC's from PT for hand placement and initiation of sit  to stand.  Pt required increased time to stand upright but was able to perform full transfer.  Ambulation/Gait Ambulation/Gait assistance: Supervision Ambulation Distance (Feet): 250 Feet Assistive device: Rolling walker (2 wheeled)     Gait velocity interpretation: at or above normal speed for age/gender General Gait Details: Pt presents with bilateral out toeing and decreased foot clearance during swing phase.  He required education concerning where to place the RW in proximity to body for safety.  Pt was able to correct and required 3-4 VC's throughout treatment following.  PT advised pt to graduate to use of SPC when in home and to be mindful of speed of movement and any dizziness that he might experience this close to post op.  Stairs            Wheelchair Mobility    Modified Rankin (Stroke Patients Only)       Balance Overall balance assessment: Modified Independent(RW)                                           Pertinent Vitals/Pain Pain Assessment: 0-10 Pain Score: 5  Pain Location: abdominal/incision area with movement. Pain Intervention(s): Limited activity within patient's tolerance    Home Living Family/patient expects to be discharged to:: Private residence Living Arrangements: Spouse/significant other Available Help at Discharge: Family Type of Home: House Home Access: Level entry     Home Layout: One level Home Equipment:  Walker - 2 wheels;Cane - single point      Prior Function Level of Independence: Independent with assistive device(s)               Hand Dominance        Extremity/Trunk Assessment   Upper Extremity Assessment Upper Extremity Assessment: Overall WFL for tasks assessed    Lower Extremity Assessment Lower Extremity Assessment: Generalized weakness(Sensation intact BLE.)    Cervical / Trunk Assessment Cervical / Trunk Assessment: Normal  Communication   Communication: No difficulties  Cognition  Arousal/Alertness: Awake/alert Behavior During Therapy: WFL for tasks assessed/performed Overall Cognitive Status: Within Functional Limits for tasks assessed                                        General Comments      Exercises     Assessment/Plan    PT Assessment Patient needs continued PT services  PT Problem List Decreased strength;Decreased activity tolerance;Decreased mobility       PT Treatment Interventions DME instruction;Gait training;Functional mobility training;Therapeutic activities;Therapeutic exercise;Balance training;Patient/family education    PT Goals (Current goals can be found in the Care Plan section)       Frequency Min 2X/week   Barriers to discharge        Co-evaluation               AM-PAC PT "6 Clicks" Daily Activity  Outcome Measure Difficulty turning over in bed (including adjusting bedclothes, sheets and blankets)?: A Little Difficulty moving from lying on back to sitting on the side of the bed? : A Little Difficulty sitting down on and standing up from a chair with arms (e.g., wheelchair, bedside commode, etc,.)?: A Little Help needed moving to and from a bed to chair (including a wheelchair)?: A Little Help needed walking in hospital room?: A Little Help needed climbing 3-5 steps with a railing? : A Little 6 Click Score: 18    End of Session Equipment Utilized During Treatment: Gait belt(Placed around mid thoracic area due to  colostomy.) Activity Tolerance: Patient tolerated treatment well Patient left: in chair;with call bell/phone within reach;with nursing/sitter in room Nurse Communication: Mobility status PT Visit Diagnosis: Other abnormalities of gait and mobility (R26.89);Muscle weakness (generalized) (M62.81);Unsteadiness on feet (R26.81);Pain Pain - part of body: (Abdomen)    Time: 2947-6546 PT Time Calculation (min) (ACUTE ONLY): 25 min   Charges:   PT Evaluation $PT Eval Low Complexity: 1 Low PT  Treatments $Therapeutic Activity: 8-22 mins   PT G Codes:   PT G-Codes **NOT FOR INPATIENT CLASS** Functional Assessment Tool Used: AM-PAC 6 Clicks Basic Mobility Functional Limitation: Changing and maintaining body position Changing and Maintaining Body Position Current Status (T0354): At least 40 percent but less than 60 percent impaired, limited or restricted Changing and Maintaining Body Position Goal Status (S5681): At least 1 percent but less than 20 percent impaired, limited or restricted    Roxanne Gates, PT, DPT   Roxanne Gates 03/23/2017, 8:52 AM

## 2017-03-23 NOTE — Progress Notes (Signed)
2 Days Post-Op   Subjective: 76 year old male 2 days status post laparoscopic colostomy reversal.  Doing very well.  Tolerating clear liquids.  Has been having evidence of flatus and a small bowel movement.  Vital signs in last 24 hours: Temp:  [98.4 F (36.9 C)-99.3 F (37.4 C)] 98.4 F (36.9 C) (12/06 0404) Pulse Rate:  [84-92] 92 (12/06 0404) Resp:  [16-20] 16 (12/06 0404) BP: (154-164)/(86-89) 157/86 (12/06 0404) SpO2:  [94 %-98 %] 94 % (12/06 0404) Last BM Date: 03/22/17  Intake/Output from previous day: 12/05 0701 - 12/06 0700 In: 3855 [P.O.:1200; I.V.:2655] Out: 4425 [Urine:4425]  GI: Abdomen is soft, appropriately tender to palpation, nondistended.  Honeycomb dressings are in place that are clean and without evidence of drainage.  Lab Results:  CBC Recent Labs    03/21/17 1627 03/22/17 0356  WBC 14.8* 10.4  HGB 12.1* 11.1*  HCT 37.0* 32.5*  PLT 272 251   CMP     Component Value Date/Time   NA 135 03/22/2017 0356   K 3.9 03/22/2017 0356   CL 107 03/22/2017 0356   CO2 22 03/22/2017 0356   GLUCOSE 119 (H) 03/22/2017 0356   BUN 15 03/22/2017 0356   CREATININE 1.27 (H) 03/22/2017 0356   CREATININE 1.40 (H) 08/12/2015 1632   CALCIUM 8.6 (L) 03/22/2017 0356   PROT 7.0 12/23/2016 1755   ALBUMIN 3.3 (L) 12/23/2016 1755   AST 17 12/23/2016 1755   ALT 11 (L) 12/23/2016 1755   ALKPHOS 69 12/23/2016 1755   BILITOT 0.5 12/23/2016 1755   GFRNONAA 53 (L) 03/22/2017 0356   GFRAA >60 03/22/2017 0356   PT/INR No results for input(s): LABPROT, INR in the last 72 hours.  Studies/Results: No results found.  Assessment/Plan: 76 year old male 2 days status post laparoscopic colostomy reversal.  Doing well.  Discussed that his Foley catheter will come out this morning.  Advance diet to full liquids this morning.  Encourage ambulation and incentive spirometer usage.   Clayburn Pert, MD [facility]  Day [facility] [phone] Night [facility] [phone]  03/23/2017

## 2017-03-24 MED ORDER — OXYCODONE-ACETAMINOPHEN 5-325 MG PO TABS
1.0000 | ORAL_TABLET | ORAL | Status: DC | PRN
  Administered 2017-03-24 (×2): 1 via ORAL
  Filled 2017-03-24: qty 2
  Filled 2017-03-24: qty 1

## 2017-03-24 MED ORDER — OXYCODONE-ACETAMINOPHEN 5-325 MG PO TABS: 1.0000 | ORAL_TABLET | ORAL | 0 refills | Status: DC | PRN

## 2017-03-24 NOTE — Progress Notes (Signed)
Physical Therapy Treatment Patient Details Name: Cameron Foster. MRN: [id number] DOB: [birth year] Today's Date: 03/24/2017    History of Present Illness Pt admitted s/p laproscopic colostomy takedown.  PMH includes: vertigo, vasovagal syncope, Prostate CA, IBS, Htn, hyperlipidemia, blood clots, GERD, dysrhythmia, diverticulosis, dyspnea, depression, colon polyp, atrial fibrillation, asthma, arthritis and alcoholism.    PT Comments    Pt presents with mild deficits in strength, transfers, mobility, gait, balance, and activity tolerance but is progressing well towards goals.  Log roll training education provided during bed mobility tasks with pt reporting improvement in abdominal comfort during sup to/sit with min A for BLEs in/out of bed.  Pt steady with transfers with cues for sequencing and was able to amb >200' with good stability and no adverse symptoms.  Verbal education regarding proper sequencing with stairs provided to pt and spouse with pt reporting difficulty going up and down stairs in the community secondary to weaker RLE.  Pt declined actual stair practice this session.  Pt will benefit from HHPT services upon discharge to safely address above deficits for decreased caregiver assistance and eventual return to PLOF.     Follow Up Recommendations  Home health PT     Equipment Recommendations       Recommendations for Other Services       Precautions / Restrictions Precautions Precautions: Fall Restrictions Weight Bearing Restrictions: No    Mobility  Bed Mobility Overal bed mobility: Needs Assistance Bed Mobility: Supine to Sit;Sit to Supine     Supine to sit: Min assist Sit to supine: Min assist   General bed mobility comments: Log roll technique training provided for minimal abdominal exertion with pt requiring mod verbal cues for sequencing.    Transfers Overall transfer level: Needs assistance Equipment used: Rolling walker (2 wheeled) Transfers: Sit  to/from Stand Sit to Stand: Supervision         General transfer comment: Pt required VC's from PT for hand placement and initiation of sit to stand.  Pt required increased time to stand upright but was able to perform full transfer.  Ambulation/Gait Ambulation/Gait assistance: Supervision Ambulation Distance (Feet): 250 Feet Assistive device: Rolling walker (2 wheeled) Gait Pattern/deviations: Step-through pattern;Decreased step length - right;Decreased step length - left;Trunk flexed   Gait velocity interpretation: Below normal speed for age/gender General Gait Details: Mod verbal cues for amb closer to RW with upright posture to pt's tolerance; slow cadence with short B step length during amb but steady without LOB   Stairs            Wheelchair Mobility    Modified Rankin (Stroke Patients Only)       Balance Overall balance assessment: Needs assistance Sitting-balance support: Feet unsupported;Feet supported;No upper extremity supported Sitting balance-Leahy Scale: Normal     Standing balance support: Bilateral upper extremity supported;During functional activity Standing balance-Leahy Scale: Good                              Cognition Arousal/Alertness: Awake/alert Behavior During Therapy: WFL for tasks assessed/performed Overall Cognitive Status: Within Functional Limits for tasks assessed                                        Exercises Total Joint Exercises Ankle Circles/Pumps: AROM;Both;10 reps Quad Sets: Strengthening;Both;10 reps Gluteal Sets: Strengthening;Both;10 reps Long Arc Quad: AROM;Both;10 reps  Knee Flexion: AROM;Both;10 reps Marching in Standing: AROM;Both;5 reps Other Exercises Other Exercises: HEP edcuation and review for BLE APs, QS, and GS x 10 each 5-6x/day    General Comments        Pertinent Vitals/Pain Pain Assessment: No/denies pain    Home Living                      Prior Function             PT Goals (current goals can now be found in the care plan section) Progress towards PT goals: Progressing toward goals    Frequency    Min 2X/week      PT Plan Current plan remains appropriate    Co-evaluation              AM-PAC PT "6 Clicks" Daily Activity  Outcome Measure                   End of Session Equipment Utilized During Treatment: Gait belt Activity Tolerance: Patient tolerated treatment well Patient left: in bed;with call bell/phone within reach;with family/visitor present;with bed alarm set Nurse Communication: Mobility status PT Visit Diagnosis: Other abnormalities of gait and mobility (R26.89);Muscle weakness (generalized) (M62.81);Unsteadiness on feet (R26.81);Pain     Time: 9798-9211 PT Time Calculation (min) (ACUTE ONLY): 23 min  Charges:  $Gait Training: 8-22 mins $Therapeutic Exercise: 8-22 mins                    G Codes:       DRoyetta Asal PT, DPT 03/24/17, 3:08 PM

## 2017-03-24 NOTE — Care Management (Signed)
Patient post colostomy reversal.  Patient lives at home with wife.  PCP SONNENBERG.  Pharmacy [facility].  PT has assessed patient and recommends home health PT.  Patient has used [facility] in the past, and would like to use them again.  Patient has Rw, Cane, and elevated toilet in the home.  Patient requesting "extended arm" to assist with wiping when he goes to the restroom.  I have checked with [facility], and this is not an item they have available.  RNCM located this item on [email or web address] and [email or web address], updated patient.  Patient request a nurse named Mardene Celeste through [facility].  Jermaine with [facility] notified of heads up referral.

## 2017-03-24 NOTE — Progress Notes (Signed)
3 Days Post-Op   Subjective: Patient did well overnight.  Tolerated his diet and is having bowel function.  Vital signs in last 24 hours: Temp:  [98.2 F (36.8 C)-99.1 F (37.3 C)] 98.2 F (36.8 C) (12/07 0418) Pulse Rate:  [89-90] 90 (12/07 0418) Resp:  [16-20] 20 (12/07 0418) BP: (142-152)/(82-87) 143/84 (12/07 0418) SpO2:  [95 %-97 %] 95 % (12/07 0418) Last BM Date: 03/23/17  Intake/Output from previous day: 12/06 0701 - 12/07 0700 In: 5075 [P.O.:400; I.V.:1336] Out: 2200 [Urine:2200]  GI: Abdomen is soft, appropriately tender to palpation at the incision site, nondistended.  Lab Results:  CBC Recent Labs    03/21/17 1627 03/22/17 0356  WBC 14.8* 10.4  HGB 12.1* 11.1*  HCT 37.0* 32.5*  PLT 272 251   CMP     Component Value Date/Time   NA 135 03/22/2017 0356   K 3.9 03/22/2017 0356   CL 107 03/22/2017 0356   CO2 22 03/22/2017 0356   GLUCOSE 119 (H) 03/22/2017 0356   BUN 15 03/22/2017 0356   CREATININE 1.27 (H) 03/22/2017 0356   CREATININE 1.40 (H) 08/12/2015 1632   CALCIUM 8.6 (L) 03/22/2017 0356   PROT 7.0 12/23/2016 1755   ALBUMIN 3.3 (L) 12/23/2016 1755   AST 17 12/23/2016 1755   ALT 11 (L) 12/23/2016 1755   ALKPHOS 69 12/23/2016 1755   BILITOT 0.5 12/23/2016 1755   GFRNONAA 53 (L) 03/22/2017 0356   GFRAA >60 03/22/2017 0356   PT/INR No results for input(s): LABPROT, INR in the last 72 hours.  Studies/Results: No results found.  Assessment/Plan: 76 year old male 3 days status post laparoscopic colostomy reversal.  Doing very well.  Discussed with the patient that if he is able to tolerate a regular diet and have his pain controlled with pills would likely be ready for discharge home later today.  Encourage ambulation and incentive spirometer usage.   Clayburn Pert, MD [facility]  Day [facility] [phone] Night [facility] [phone]  03/24/2017

## 2017-03-24 NOTE — Care Management Important Message (Signed)
Important Message  Patient Details  Name: Cameron Foster. MRN: [id number] Date of Birth: [birth year]   Medicare Important Message Given:  Yes    Beverly Sessions, RN 03/24/2017, 10:57 AM

## 2017-03-25 LAB — CREATININE, SERUM
CREATININE: 1.18 mg/dL (ref 0.61–1.24)
GFR calc Af Amer: >60 mL/min (ref >60)
GFR, EST NON AFRICAN AMERICAN: 58 mL/min — AB (ref >60)

## 2017-03-25 MED ORDER — OXYCODONE-ACETAMINOPHEN 7.5-325 MG PO TABS
1.0000 | ORAL_TABLET | ORAL | 0 refills | Status: AC | PRN
Start: 2017-03-25 — End: 2017-04-01

## 2017-03-25 NOTE — Discharge Instructions (Signed)
°  Diet: Resume soft low fiber diet for 2 weeks. After two weeks, start regular high fiber diet. It is preferred to have frequent small meals than having 3 big meals a day. Drink at least 8 glasses of water daily.   Activity: No heavy lifting >10 pounds (children, pets, laundry, garbage) or strenuous activity for the next 6 weeks, but light activity and walking are encouraged.   Do not drive for four weeks.  It is normal to feel tire most of the time since your body is using energy to heal.    Wound care: May shower with soapy water and pat dry (do not rub incisions), but no baths or submerging incision underwater until follow-up. (no swimming)   Do not smoke, since smoking delays the process of healing, among other negative effects.   Call the office if the wound becomes red and start to drain pus.   Medications: Resume all home medications. For mild to moderate pain: acetaminophen (Tylenol) or ibuprofen (if no kidney disease). Combining Tylenol with alcohol can substantially increase your risk of causing liver disease. Narcotic pain medications, if prescribed, can be used for severe pain, though may cause nausea, constipation, and drowsiness. Do not combine Tylenol and Percocet within a 6 hour period as Percocet contains Tylenol. If you do not need the narcotic pain medication, you do not need to fill the prescription. Do not drink alcohol while taking narcotics.   Call office:  at any time if any questions, worsening pain, fevers/chills, bleeding, drainage from incision site, or other concerns.

## 2017-03-25 NOTE — Care Management Note (Signed)
Case Management Note  Patient Details  Name: Cameron Foster. MRN: [id number] Date of Birth: 1940-12-17  Subjective/Objective:   A referral for HH=PT and RN was previously discussed with Mr Goetze and has been set up with [facility].                  Action/Plan:   Expected Discharge Date:  03/25/17               Expected Discharge Plan:  [place]  In-House Referral:     Discharge planning Services  CM Consult  Post Acute Care Choice:  Home Health Choice offered to:  Patient  DME Arranged:  N/A DME Agency:  NA  HH Arranged:  RN, PT [place] Agency:  [facility]  Status of Service:  Completed, signed off  If discussed at [place] of Stay Meetings, dates discussed:    Additional Comments:  Jaidon Ellery A, RN 03/25/2017, 9:58 AM

## 2017-03-25 NOTE — Progress Notes (Signed)
MD ordered patient to be discharged home.  Discharge instructions were reviewed with the patient and he voiced understanding.  Follow-up appointment was made.  Prescription given to the patient.  IV was removed with catheter intact. Patient given gauze and tape and instructed on how to put dressing on his incisions.   All patients questions were answered.  Patient leaving via wheelchair escorted by auxillary.

## 2017-03-25 NOTE — Discharge Summary (Signed)
Physician Discharge Summary  Patient ID: Cameron Foster. MRN: [id number] DOB/AGE: [birth year] 76 y.o.  Admit date: 03/21/2017 Discharge date: 03/25/2017  Admission Diagnoses: Colostomy status  Discharge Diagnoses:  Active Problems:   History of colostomy reversal History of complicated diverticulitis Essential Hypertension  Discharged Condition: good  Hospital Course: Patient came to the hospital and had Laparoscopic Colostomy reversal. Tolerated procedure well. Recovered properly. Currently having bowel movement, tolerating full liquid diet, voiding spontaneously and ambulating. WBC and Hgb within normal limits.  Consults: None  Significant Diagnostic Studies: None   Treatments: Laparoscopic colostomy reversal  Discharge Exam: Blood pressure 140/89, pulse 82, temperature 98 F (36.7 C), temperature source Oral, resp. rate 16, height 5' 9"  (1.753 m), weight 88.5 kg (194 lb 16 oz), SpO2 97 %. General: alert, active, oriented x3 Heart: Regular rate and rhythm Abdomen: soft and depressible, non tender, non distended. Wounds are dry and clean. No erythema, no secretions.  Extremities: No edema, No cyanosis.   Disposition: 01-Home or Self Care   Allergies as of 03/25/2017      Reactions   Augmentin [amoxicillin-pot Clavulanate] Other (See Comments)   Abdominal upset Has patient had a PCN reaction causing immediate rash, facial/tongue/throat swelling, SOB or lightheadedness with hypotension: Unknown Has patient had a PCN reaction causing severe rash involving mucus membranes or skin necrosis: Unknown Has patient had a PCN reaction that required hospitalization: Unknown Has patient had a PCN reaction occurring within the last 10 years: Unknown If all of the above answers are "NO", then may proceed with Cephalosporin use.   Formaldehyde Rash   Tape Rash      Medication List    STOP taking these medications   metroNIDAZOLE 500 MG tablet Commonly known as:  FLAGYL      TAKE these medications   amLODipine 5 MG tablet Commonly known as:  NORVASC Take 1 tablet (5 mg total) by mouth daily. What changed:  when to take this   docusate sodium 100 MG capsule Commonly known as:  COLACE Take 2 capsules (200 mg total) by mouth 2 (two) times daily.   fluticasone 50 MCG/ACT nasal spray Commonly known as:  FLONASE Place 1 spray into both nostrils daily as needed for allergies or rhinitis.   gabapentin 400 MG capsule Commonly known as:  NEURONTIN Take 400 mg by mouth daily as needed (nerve pain).   GOODY HEADACHE PO Take 1 Package by mouth daily as needed (headaches).   hydrocortisone 2.5 % cream APPLY  CREAM TO AFFECTED AREA TWICE DAILY AS NEEDED FOR  RASH   lisinopril 10 MG tablet Commonly known as:  PRINIVIL,ZESTRIL TAKE ONE TABLET BY MOUTH ONCE DAILY   liver oil-zinc oxide 40 % ointment Commonly known as:  DESITIN Apply 1 application topically daily as needed for irritation.   metoprolol succinate 25 MG 24 hr tablet Commonly known as:  TOPROL-XL Take 1 tablet (25 mg total) by mouth daily.   omeprazole 20 MG capsule Commonly known as:  PRILOSEC Take 20 mg by mouth daily as needed (heartburn).   oxyCODONE-acetaminophen 5-325 MG tablet Commonly known as:  PERCOCET/ROXICET Take 1-2 tablets by mouth every 4 (four) hours as needed for moderate pain or severe pain.   polyethylene glycol packet Commonly known as:  MIRALAX / GLYCOLAX Take 17 g by mouth daily.   potassium chloride SA 20 MEQ tablet Commonly known as:  K-DUR,KLOR-CON Take 1 tablet (20 mEq total) by mouth 2 (two) times daily for 5 days.   sertraline 100  MG tablet Commonly known as:  ZOLOFT Take 1 tablet (100 mg total) by mouth daily. What changed:  when to take this   sodium phosphate 7-19 GM/118ML Enem Please see bowel prep instructions. You may use this when needed for bowel movements.        Signed: Herbert Pun 03/25/2017, 7:51 AM

## 2017-03-28 ENCOUNTER — Telehealth: Payer: Self-pay | Admitting: General Surgery

## 2017-03-28 DIAGNOSIS — K219 Gastro-esophageal reflux disease without esophagitis: Secondary | ICD-10-CM | POA: Diagnosis not present

## 2017-03-28 DIAGNOSIS — I10 Essential (primary) hypertension: Secondary | ICD-10-CM | POA: Diagnosis not present

## 2017-03-28 DIAGNOSIS — F329 Major depressive disorder, single episode, unspecified: Secondary | ICD-10-CM | POA: Diagnosis not present

## 2017-03-28 DIAGNOSIS — Z48815 Encounter for surgical aftercare following surgery on the digestive system: Secondary | ICD-10-CM | POA: Diagnosis not present

## 2017-03-28 DIAGNOSIS — Z433 Encounter for attention to colostomy: Secondary | ICD-10-CM | POA: Diagnosis not present

## 2017-03-28 DIAGNOSIS — K5720 Diverticulitis of large intestine with perforation and abscess without bleeding: Secondary | ICD-10-CM | POA: Diagnosis not present

## 2017-03-28 NOTE — Telephone Encounter (Signed)
Spoke with [place] with home health care and provided verbal orders.  Patient has follow up appointment tomorrow.

## 2017-03-28 NOTE — Telephone Encounter (Signed)
Needs orders to resume care with home health.

## 2017-03-29 ENCOUNTER — Encounter: Payer: Self-pay | Admitting: General Surgery

## 2017-03-29 ENCOUNTER — Ambulatory Visit (INDEPENDENT_AMBULATORY_CARE_PROVIDER_SITE_OTHER): Payer: Medicare Other | Admitting: General Surgery

## 2017-03-29 VITALS — BP 144/80 | HR 87 | Temp 97.8°F | Ht 69.0 in | Wt 199.0 lb

## 2017-03-29 DIAGNOSIS — Z4889 Encounter for other specified surgical aftercare: Secondary | ICD-10-CM

## 2017-03-29 NOTE — Patient Instructions (Signed)
We will see you back in office as listed below:  You do not need to cover your incisions if there is no drainage.  We have removed some of your staples today and replaced them with Steri-stripes. These will fall off on their own when ready.  Please give our office a call if you have any questions or concerns.

## 2017-03-29 NOTE — Progress Notes (Signed)
Outpatient Surgical Follow Up  03/29/2017  Cameron Capell. is an 76 y.o. male.   Chief Complaint  Patient presents with  . Routine Post Op    post op-Laparoscopic colostomy reversal, flexible sigmoidoscopy, anastomotic revision-Rowland Ericsson-03/21/17    HPI: 76 year old male returns to clinic now 8 days status post laparoscopic colostomy reversal.  Patient reports doing very well.  He is only occasionally needing a pain medicine to help him sleep at night.  He has been eating well and having bowel function.  He reports he still has some bowel urgency and has to immediately go to the bathroom when he has the sensation.  He thinks it might be improving.  He denies any fevers, chills, nausea, vomiting, chest pain, shortness of breath.  Past Medical History:  Diagnosis Date  . Alcoholism ([place])   . Arrhythmia   . Arthritis   . Asthma   . Atrial fibrillation ([place])    one episode  . Colon polyp   . Depression    Son died 06-18-14  . Diverticulitis   . Diverticulosis 30 years  . Dyspnea   . Dysrhythmia    At [facility] 1995  . Fainting   . GERD (gastroesophageal reflux disease)   . History of blood clots 06/19/2015   pt states" clots in my urine"  . Hyperlipidemia   . Hypertension   . Irritable bowel syndrome   . Prostate cancer ([place]) 06-18-2010   treated with radiation therapy. Prostate  . Vasovagal syncope   . Vertigo     Past Surgical History:  Procedure Laterality Date  . CARDIAC CATHETERIZATION     [place] no stents  . CATARACT EXTRACTION, BILATERAL    . COLON RESECTION SIGMOID N/A 12/07/2016   Procedure: COLON RESECTION SIGMOID;  Surgeon: Clayburn Pert, MD;  Location: [facility] ORS;  Service: General;  Laterality: N/A;  . COLON SURGERY  11/2016   Colostomy  . COLONOSCOPY  06/18/2014  . COLONOSCOPY WITH PROPOFOL N/A 05/30/2016   Procedure: COLONOSCOPY WITH PROPOFOL;  Surgeon: Lollie Sails, MD;  Location: [facility] ENDOSCOPY;  Service: Endoscopy;  Laterality: N/A;  . COLOSTOMY Left 12/07/2016   Procedure: COLOSTOMY;  Surgeon: Clayburn Pert, MD;  Location: [facility] ORS;  Service: General;  Laterality: Left;  . COLOSTOMY REVERSAL N/A 03/21/2017   Procedure: COLOSTOMY REVERSAL;  Surgeon: Clayburn Pert, MD;  Location: [facility] ORS;  Service: General;  Laterality: N/A;  . COLOSTOMY TAKEDOWN N/A 03/21/2017   Procedure: LAPAROSCOPIC COLOSTOMY TAKEDOWN;  Surgeon: Clayburn Pert, MD;  Location: [facility] ORS;  Service: General;  Laterality: N/A;  . CYSTOSCOPY WITH STENT PLACEMENT Bilateral 03/21/2017   Procedure: CYSTOSCOPY WITH LIGHTED STENT PLACEMENT;  Surgeon: Abbie Sons, MD;  Location: [facility] ORS;  Service: Urology;  Laterality: Bilateral;  . ESOPHAGOGASTRODUODENOSCOPY    . ESOPHAGOGASTRODUODENOSCOPY N/A 05/30/2016   Procedure: ESOPHAGOGASTRODUODENOSCOPY (EGD);  Surgeon: Lollie Sails, MD;  Location: [facility] ENDOSCOPY;  Service: Endoscopy;  Laterality: N/A;  . EYE SURGERY     cataracts  . FLEXIBLE SIGMOIDOSCOPY N/A 03/21/2017   Procedure: FLEXIBLE SIGMOIDOSCOPY;  Surgeon: Clayburn Pert, MD;  Location: [facility] ORS;  Service: General;  Laterality: N/A;  . FRACTURE SURGERY Bilateral    right arm and left wrist  . INCISION AND DRAINAGE ABSCESS N/A 12/07/2016   Procedure: DRAINAGE  OF INTRA ABDOMINAL ABSCESS;  Surgeon: Clayburn Pert, MD;  Location: [facility] ORS;  Service: General;  Laterality: N/A;  . KNEE ARTHROSCOPY    . LAPAROTOMY N/A 12/07/2016   Procedure: EXPLORATORY LAPAROTOMY;  Surgeon: Adonis Huguenin,  Juanda Crumble, MD;  Location: [facility] ORS;  Service: General;  Laterality: N/A;  . PROSTATE SURGERY     Microwave therapy  . REPLACEMENT TOTAL KNEE Right 07/2016  . TONSILLECTOMY    . TOTAL KNEE ARTHROPLASTY Right 07/27/2016   Procedure: TOTAL KNEE ARTHROPLASTY;  Surgeon: [name], MD;  Location: [facility] ORS;  Service: Orthopedics;  Laterality: Right;  Dr. Erlene Quan had to place Urinary catheter due to prostate cancer history.  Using flexible scope.    Family History  Problem Relation Age of Onset  .  Lung cancer Father   . Other Mother   . Bipolar disorder Son   . Sudden death Son        due to drug over dose and cardiac issues  . Prostate cancer Neg Hx   . Bladder Cancer Neg Hx     Social History:  reports that he quit smoking about 28 years ago. He quit after 27.00 years of use. His smokeless tobacco use includes chew. He reports that he drinks about 2.4 oz of alcohol per week. He reports that he does not use drugs.  Allergies:  Allergies  Allergen Reactions  . Augmentin [Amoxicillin-Pot Clavulanate] Other (See Comments)    Abdominal upset Has patient had a PCN reaction causing immediate rash, facial/tongue/throat swelling, SOB or lightheadedness with hypotension: Unknown Has patient had a PCN reaction causing severe rash involving mucus membranes or skin necrosis: Unknown Has patient had a PCN reaction that required hospitalization: Unknown Has patient had a PCN reaction occurring within the last 10 years: Unknown If all of the above answers are "NO", then may proceed with Cephalosporin use.   . Formaldehyde Rash  . Tape Rash    Medications reviewed.    ROS A multipoint review of systems was completed, all pertinent positives and negatives are documented within the HPI and the remainder are negative   BP (!) 144/80   Pulse 87   Temp 97.8 F (36.6 C) (Oral)   Ht 5' 9"  (1.753 m)   Wt 90.3 kg (199 lb)   BMI 29.39 kg/m   Physical Exam General: No acute distress Chest: Clear to auscultation Heart: Regular rate and rhythm Abdomen: Soft appropriately tender to palpation at well approximated laparoscopic incision sites in the right hemiabdomen.  Left sided prior ostomy site with Penrose in place without any evidence of erythema or purulence.    No results found for this or any previous visit (from the past 48 hour(s)). No results found.  Assessment/Plan:  1. Aftercare following surgery 76 year old male 8 days status post laparoscopic colostomy reversal.  Doing  very well.  Penrose drain and staples from laparoscopic sites all removed today.  Staples from laparoscopic sites were replaced with Steri-Strips.  Discussed the signs and symptoms of infection and to return to clinic immediately should they occur.  Otherwise he will follow-up in clinic next week for an additional wound check and hopefully have the remainder of his staples removed.     Clayburn Pert, MD FACS General Surgeon  03/29/2017,2:27 PM

## 2017-03-30 ENCOUNTER — Telehealth: Payer: Self-pay | Admitting: General Surgery

## 2017-03-30 NOTE — Telephone Encounter (Signed)
Called patient back and he stated that he was having some constipation ,therefore, developed hemorrhoids. I asked the patient if he had tried taking miralax 17 G BID. He stated that he had not. I told him to try Miralax 17 G BID to help him have a bowel movement and to use Preparation H cream to help with his hemorrhoid. I informed patient to wipe his rectum with wipes after every bowel movement and then apply the preparation H. Patient stated that he would try to do that and if it did not work, he would call us back. I told him that it was fine to call us if he had additional questions or concerns.

## 2017-03-30 NOTE — Telephone Encounter (Signed)
Patient has called and left a message on voicemail at 12:30pm. He stated that he would like to talk with a nurse about "some post op problems", however he did not mention the problems.   Please call back at home number or cell phone number.

## 2017-04-03 DIAGNOSIS — K219 Gastro-esophageal reflux disease without esophagitis: Secondary | ICD-10-CM | POA: Diagnosis not present

## 2017-04-03 DIAGNOSIS — K5720 Diverticulitis of large intestine with perforation and abscess without bleeding: Secondary | ICD-10-CM | POA: Diagnosis not present

## 2017-04-03 DIAGNOSIS — Z48815 Encounter for surgical aftercare following surgery on the digestive system: Secondary | ICD-10-CM | POA: Diagnosis not present

## 2017-04-03 DIAGNOSIS — F329 Major depressive disorder, single episode, unspecified: Secondary | ICD-10-CM | POA: Diagnosis not present

## 2017-04-03 DIAGNOSIS — Z433 Encounter for attention to colostomy: Secondary | ICD-10-CM | POA: Diagnosis not present

## 2017-04-03 DIAGNOSIS — I10 Essential (primary) hypertension: Secondary | ICD-10-CM | POA: Diagnosis not present

## 2017-04-04 ENCOUNTER — Telehealth: Payer: Self-pay

## 2017-04-04 NOTE — Telephone Encounter (Signed)
[facility] for continuance of wound care at this time, however per [place] per  Dr.Woodham, home health care is not needed at this time due to patient only needing to apply a band aid over the area. Order sheet faxed to [facility] stating the above.

## 2017-04-05 ENCOUNTER — Encounter: Payer: Self-pay | Admitting: Surgery

## 2017-04-05 ENCOUNTER — Ambulatory Visit (INDEPENDENT_AMBULATORY_CARE_PROVIDER_SITE_OTHER): Payer: Medicare Other | Admitting: Surgery

## 2017-04-05 VITALS — BP 132/81 | HR 90 | Temp 98.3°F | Wt 191.0 lb

## 2017-04-05 DIAGNOSIS — Z4889 Encounter for other specified surgical aftercare: Secondary | ICD-10-CM

## 2017-04-05 NOTE — Progress Notes (Signed)
Outpatient postop visit  04/05/2017  Cameron Foster. is an 76 y.o. male.    Procedure: Laparoscopic colostomy closure  CC: Multiple minor complaints  HPI: This patient status post laparoscopic colostomy closure by Dr. Adonis Huguenin.  Patient complains about the location of his incision compared to his belt line.  He complains about occasional hemorrhoids.  He complains about loose stools but he is taking both MiraLAX daily and twice a day Colace.  He is done that because of his irritable bowel syndrome.  He is eating better and having no fevers or chills.  Medications reviewed.    Physical Exam:  There were no vitals taken for this visit.    PE: Soft nondistended nontender abdomen wounds are clean no erythema staples are removed from the colostomy site.    Assessment/Plan:  Status post laparoscopic colostomy closure.  Patient is doing quite well.  I discussed and try to answer questions about his multiple complaints some of which I did not have good answers for such as his inability to urinate while sitting down having a bowel movement at the same time.  Currently I think he is doing quite well his staples have been removed.  I suggested that he stop the MiraLAX and cut the Colace and a half dosing and return to see [place] in 2 weeks for discussion.  At some point he may need to reinstitute MiraLAX but for right now he is having multiple loose stools during the day.  Questions were answered for him.  Florene Glen, MD, FACS

## 2017-04-05 NOTE — Patient Instructions (Addendum)
Please give [place] a call in case you have any questions or concerns.  Please go to your local pharmacy and get some Simethicone to help you with your gas and bloating.  Please cut out the Miralax at this time.  Please cut out taking Colace to once a day.   Please tell your urologist that when you are having a bowel movement, you are not able to urinate.  We will see you back in 2 weeks to make sure that you are doing well.

## 2017-04-19 ENCOUNTER — Encounter: Payer: Self-pay | Admitting: Family Medicine

## 2017-04-19 ENCOUNTER — Ambulatory Visit (INDEPENDENT_AMBULATORY_CARE_PROVIDER_SITE_OTHER): Payer: Medicare Other | Admitting: Family Medicine

## 2017-04-19 VITALS — BP 134/74 | HR 76 | Temp 97.7°F | Ht 69.0 in | Wt 193.6 lb

## 2017-04-19 DIAGNOSIS — Z9049 Acquired absence of other specified parts of digestive tract: Secondary | ICD-10-CM

## 2017-04-19 DIAGNOSIS — I10 Essential (primary) hypertension: Secondary | ICD-10-CM

## 2017-04-19 DIAGNOSIS — F419 Anxiety disorder, unspecified: Secondary | ICD-10-CM | POA: Diagnosis not present

## 2017-04-19 DIAGNOSIS — N4889 Other specified disorders of penis: Secondary | ICD-10-CM | POA: Diagnosis not present

## 2017-04-19 LAB — POCT URINALYSIS DIPSTICK
GLUCOSE UA: NEGATIVE
LEUKOCYTES UA: NEGATIVE
Nitrite, UA: NEGATIVE
Protein, UA: 30
SPEC GRAV UA: 1.025 (ref 1.010–1.025)
Urobilinogen, UA: 0.2 E.U./dL
pH, UA: 5.5 (ref 5.0–8.0)

## 2017-04-19 NOTE — Progress Notes (Signed)
[name], MD Phone: [phone]  Cameron Foster. is a 77 y.o. male who presents today for follow-up.  Patient underwent reversal of his colostomy recently.  He has done fairly well.  He was having too frequent bowel movements at first though they backed off on his laxative and now he is having several bowel movements daily.  He notes since moving back into sleeping in the bed he has had some right upper quadrant pain only when he sleeps on his left side.  No symptoms at other times.  Potassium was intermittently low when he was hospitalized.  No blood in his stool.  Hypertension: Fairly well controlled.  Taking lisinopril, amlodipine, metoprolol.  No chest pain or shortness of breath.  Anxiety: Notes this is fairly well controlled with Zoloft.  It has been very helpful.  He notes no depression.  Notes wife just makes him anxious.  Patient notes at times he will have a pain that shoots down from his suprapubic region into his penis.  This occurs randomly.  Not associated with urination.  No dysuria.  No discharge.  No frequency.  Does follow up with his urologist in the next several months.  Social History   Tobacco Use  Smoking Status Former Smoker  . Years: 27.00  . Last attempt to quit: 04/18/1988  . Years since quitting: 29.0  Smokeless Tobacco Current User  . Types: Chew     ROS see history of present illness  Objective  Physical Exam Vitals:   04/19/17 1530  BP: 134/74  Pulse: 76  Temp: 97.7 F (36.5 C)  SpO2: 96%    BP Readings from Last 3 Encounters:  04/19/17 134/74  04/05/17 132/81  03/29/17 (!) 144/80   Wt Readings from Last 3 Encounters:  04/19/17 193 lb 9.6 oz (87.8 kg)  04/05/17 191 lb (86.6 kg)  03/29/17 199 lb (90.3 kg)    Physical Exam  Constitutional: No distress.  Cardiovascular: Normal rate, regular rhythm and normal heart sounds.  Pulmonary/Chest: Effort normal and breath sounds normal.  Abdominal: Soft. Bowel sounds are normal. He  exhibits no distension. There is no tenderness.  Scars appear to be well-healing  Genitourinary:  Genitourinary Comments: Normal circumcised penis with no discharge or irritation, no testicular discomfort, no epididymal discomfort, no vas deferens discomfort, no inguinal hernias  Musculoskeletal: He exhibits no edema.  Neurological: He is alert. Gait normal.  Skin: Skin is warm and dry. He is not diaphoretic.     Assessment/Plan: Please see individual problem list.  Essential hypertension Well-controlled.  Check CMP.  Continue current medications.  Penile pain Intermittent rare shooting pains from suprapubic region into his penis.  Unsure of cause.  Will check urinalysis.  He will contact his urologist to inform them of this.  Status post partial colectomy Patient recently underwent reversal.  He has done fairly well.  Suspect the right upper quadrant discomfort he has is muscular given that it only occurs when he lays on his left side though we will check a CMP to evaluate liver function.  He will mention this discomfort to his surgeon as well.  If it worsens or develops new symptoms will be evaluated.  Anxiety Fairly well controlled with Zoloft.  He will continue this medication.   Flem was seen today for follow-up.  Diagnoses and all orders for this visit:  Penile pain -     POCT Urinalysis Dipstick  Essential hypertension -     Comp Met (CMET)  Status post  partial colectomy  Anxiety    Orders Placed This Encounter  Procedures  . Comp Met (CMET)  . POCT Urinalysis Dipstick    No orders of the defined types were placed in this encounter.    [name], MD [place]

## 2017-04-19 NOTE — Progress Notes (Signed)
Pre visit review using our clinic review tool, if applicable. No additional management support is needed unless otherwise documented below in the visit note. 

## 2017-04-19 NOTE — Patient Instructions (Signed)
Nice to see you. We will contact you with your lab results. Please contact your urologist and your surgeon regarding the discomfort you get at times.  Please also let your surgeon know when you see them.  If you develop abdominal pain or other symptoms please be evaluated.

## 2017-04-19 NOTE — Assessment & Plan Note (Signed)
Fairly well controlled with Zoloft.  He will continue this medication.

## 2017-04-19 NOTE — Assessment & Plan Note (Signed)
Well-controlled.  Check CMP.  Continue current medications.

## 2017-04-19 NOTE — Assessment & Plan Note (Signed)
Intermittent rare shooting pains from suprapubic region into his penis.  Unsure of cause.  Will check urinalysis.  He will contact his urologist to inform them of this.

## 2017-04-19 NOTE — Assessment & Plan Note (Signed)
Patient recently underwent reversal.  He has done fairly well.  Suspect the right upper quadrant discomfort he has is muscular given that it only occurs when he lays on his left side though we will check a CMP to evaluate liver function.  He will mention this discomfort to his surgeon as well.  If it worsens or develops new symptoms will be evaluated.

## 2017-04-20 LAB — COMPREHENSIVE METABOLIC PANEL WITH GFR
ALT: 8 U/L (ref 0–53)
AST: 15 U/L (ref 0–37)
Albumin: 3.7 g/dL (ref 3.5–5.2)
Alkaline Phosphatase: 70 U/L (ref 39–117)
BUN: 13 mg/dL (ref 6–23)
CO2: 28 meq/L (ref 19–32)
Calcium: 9.4 mg/dL (ref 8.4–10.5)
Chloride: 104 meq/L (ref 96–112)
Creatinine, Ser: 1.22 mg/dL (ref 0.40–1.50)
GFR: 61.28 mL/min
Glucose, Bld: 79 mg/dL (ref 70–99)
Potassium: 3.9 meq/L (ref 3.5–5.1)
Sodium: 139 meq/L (ref 135–145)
Total Bilirubin: 0.4 mg/dL (ref 0.2–1.2)
Total Protein: 7.1 g/dL (ref 6.0–8.3)

## 2017-04-20 LAB — URINALYSIS, MICROSCOPIC ONLY

## 2017-04-20 LAB — URINE CULTURE
MICRO NUMBER:: 90003350
Result:: NO GROWTH
SPECIMEN QUALITY:: ADEQUATE

## 2017-04-24 ENCOUNTER — Encounter: Payer: Self-pay | Admitting: General Surgery

## 2017-04-24 ENCOUNTER — Other Ambulatory Visit: Payer: Self-pay

## 2017-04-24 ENCOUNTER — Ambulatory Visit (INDEPENDENT_AMBULATORY_CARE_PROVIDER_SITE_OTHER): Payer: Medicare Other | Admitting: General Surgery

## 2017-04-24 VITALS — BP 131/86 | HR 80 | Temp 98.1°F | Wt 194.0 lb

## 2017-04-24 DIAGNOSIS — R630 Anorexia: Secondary | ICD-10-CM

## 2017-04-24 DIAGNOSIS — Z4889 Encounter for other specified surgical aftercare: Secondary | ICD-10-CM

## 2017-04-24 MED ORDER — LISINOPRIL 10 MG PO TABS: 10.0000 mg | ORAL_TABLET | Freq: Every day | ORAL | 1 refills | Status: DC

## 2017-04-24 NOTE — Patient Instructions (Addendum)
Please give [place] a call in case you have any questions or concerns.  Please go to the [facility] and have your abdominal X-Ray done. We will see you back in 1 week.

## 2017-04-24 NOTE — Progress Notes (Signed)
Outpatient Surgical Follow Up  04/24/2017  Cameron Bley. is an 77 y.o. male.   Chief Complaint  Patient presents with  . Routine Post Op    Laparoscopic colostomy reversal, flexible sigmoidoscopy, anastomotic revision-Emeree Mahler-03/21/17    HPI: 77 year old male returns to clinic for follow-up now 1 month status post laparoscopic colostomy reversal.  Patient reports that his pain complaints have completely resolved.  He continues to have bowel function and is tolerating a diet.  He does state that his appetite is much decreased and that every time he eats a large meal he has to immediately go to the bathroom.  He denies any fevers, chills, chest pain, shortness of breath, abdominal pain nausea, vomiting.  He states he just feels full all the time.  Past Medical History:  Diagnosis Date  . Alcoholism ([place])   . Arrhythmia   . Arthritis   . Asthma   . Atrial fibrillation ([place])    one episode  . Colon polyp   . Depression    Son died 2014/06/24  . Diverticulitis   . Diverticulosis 30 years  . Dyspnea   . Dysrhythmia    At Fib 1995  . Fainting   . GERD (gastroesophageal reflux disease)   . History of blood clots 25-Jun-2015   pt states" clots in my urine"  . Hyperlipidemia   . Hypertension   . Irritable bowel syndrome   . Prostate cancer ([place]) 2010-06-24   treated with radiation therapy. Prostate  . Vasovagal syncope   . Vertigo     Past Surgical History:  Procedure Laterality Date  . CARDIAC CATHETERIZATION     [place] no stents  . CATARACT EXTRACTION, BILATERAL    . COLON RESECTION SIGMOID N/A 12/07/2016   Procedure: COLON RESECTION SIGMOID;  Surgeon: Clayburn Pert, MD;  Location: [facility] ORS;  Service: General;  Laterality: N/A;  . COLON SURGERY  11/2016   Colostomy  . COLONOSCOPY  06-24-14  . COLONOSCOPY WITH PROPOFOL N/A 05/30/2016   Procedure: COLONOSCOPY WITH PROPOFOL;  Surgeon: Lollie Sails, MD;  Location: [facility] ENDOSCOPY;  Service: Endoscopy;  Laterality: N/A;  . COLOSTOMY  Left 12/07/2016   Procedure: COLOSTOMY;  Surgeon: Clayburn Pert, MD;  Location: [facility] ORS;  Service: General;  Laterality: Left;  . COLOSTOMY REVERSAL N/A 03/21/2017   Procedure: COLOSTOMY REVERSAL;  Surgeon: Clayburn Pert, MD;  Location: [facility] ORS;  Service: General;  Laterality: N/A;  . COLOSTOMY TAKEDOWN N/A 03/21/2017   Procedure: LAPAROSCOPIC COLOSTOMY TAKEDOWN;  Surgeon: Clayburn Pert, MD;  Location: [facility] ORS;  Service: General;  Laterality: N/A;  . CYSTOSCOPY WITH STENT PLACEMENT Bilateral 03/21/2017   Procedure: CYSTOSCOPY WITH LIGHTED STENT PLACEMENT;  Surgeon: Abbie Sons, MD;  Location: [facility] ORS;  Service: Urology;  Laterality: Bilateral;  . ESOPHAGOGASTRODUODENOSCOPY    . ESOPHAGOGASTRODUODENOSCOPY N/A 05/30/2016   Procedure: ESOPHAGOGASTRODUODENOSCOPY (EGD);  Surgeon: Lollie Sails, MD;  Location[facility] ENDOSCOPY;  Service: Endoscopy;  Laterality: N/A;  . EYE SURGERY     cataracts  . FLEXIBLE SIGMOIDOSCOPY N/A 03/21/2017   Procedure: FLEXIBLE SIGMOIDOSCOPY;  Surgeon: Clayburn Pert, MD;  Location: [facility] ORS;  Service: General;  Laterality: N/A;  . FRACTURE SURGERY Bilateral    right arm and left wrist  . INCISION AND DRAINAGE ABSCESS N/A 12/07/2016   Procedure: DRAINAGE  OF INTRA ABDOMINAL ABSCESS;  Surgeon: Clayburn Pert, MD;  Location: [facility] ORS;  Service: General;  Laterality: N/A;  . KNEE ARTHROSCOPY    . LAPAROTOMY N/A 12/07/2016   Procedure: EXPLORATORY LAPAROTOMY;  Surgeon: Clayburn Pert, MD;  Location: [facility] ORS;  Service: General;  Laterality: N/A;  . PROSTATE SURGERY     Microwave therapy  . REPLACEMENT TOTAL KNEE Right 07/2016  . TONSILLECTOMY    . TOTAL KNEE ARTHROPLASTY Right 07/27/2016   Procedure: TOTAL KNEE ARTHROPLASTY;  Surgeon: [name], MD;  Location: [facility] ORS;  Service: Orthopedics;  Laterality: Right;  Dr. Erlene Quan had to place Urinary catheter due to prostate cancer history.  Using flexible scope.    Family History  Problem Relation  Age of Onset  . Lung cancer Father   . Other Mother   . Bipolar disorder Son   . Sudden death Son        due to drug over dose and cardiac issues  . Prostate cancer Neg Hx   . Bladder Cancer Neg Hx     Social History:  reports that he quit smoking about 29 years ago. He quit after 27.00 years of use. His smokeless tobacco use includes chew. He reports that he drinks about 2.4 oz of alcohol per week. He reports that he does not use drugs.  Allergies:  Allergies  Allergen Reactions  . Augmentin [Amoxicillin-Pot Clavulanate] Other (See Comments)    Abdominal upset Has patient had a PCN reaction causing immediate rash, facial/tongue/throat swelling, SOB or lightheadedness with hypotension: Unknown Has patient had a PCN reaction causing severe rash involving mucus membranes or skin necrosis: Unknown Has patient had a PCN reaction that required hospitalization: Unknown Has patient had a PCN reaction occurring within the last 10 years: Unknown If all of the above answers are "NO", then may proceed with Cephalosporin use.   . Formaldehyde Rash  . Tape Rash    Medications reviewed.    ROS A multipoint review of systems was completed, all pertinent positives and negatives are documented within the HPI and the remainder are negative   BP 131/86   Pulse 80   Temp 98.1 F (36.7 C) (Oral)   Wt 88 kg (194 lb)   BMI 28.65 kg/m   Physical Exam General: No acute distress Chest: Clear to auscultation  heart: Rate and rhythm Abdomen: Soft, nontender, nondistended.  Well approximated and healed laparoscopic and prior colostomy incision sites.    No results found for this or any previous visit (from the past 48 hour(s)). No results found.  Assessment/Plan:  1. Loss of appetite Patient with loss of appetite and what sounds like a large stool burden despite his negative exam.  We will obtain abdominal plain films and see back in clinic in 1 week. - DG Abd 2 Views; Future  2.  Aftercare following surgery Patient doing very well from a postoperative surgical standpoint.  No signs of infection or wound problems.     Clayburn Pert, MD FACS General Surgeon  04/24/2017,4:47 PM

## 2017-04-25 ENCOUNTER — Ambulatory Visit
Admission: RE | Admit: 2017-04-25 | Discharge: 2017-04-25 | Disposition: A | Discharge status: Home / Self Care | Payer: Medicare Other | Source: Ambulatory Visit | Attending: General Surgery | Admitting: General Surgery

## 2017-04-25 DIAGNOSIS — R630 Anorexia: Secondary | ICD-10-CM | POA: Insufficient documentation

## 2017-04-25 IMAGING — CR DG ABDOMEN 2V
1 series · 3 of 3 positions shown · non-contrast
Comparison: CT abdomen pelvis [DATE]

CLINICAL DATA: Loss of appetite

EXAM:
ABDOMEN - 2 VIEW

[Series 1: dg abd 2 views · 0.14mm/px · 3 of 3 slices shown]
[im 1/3]
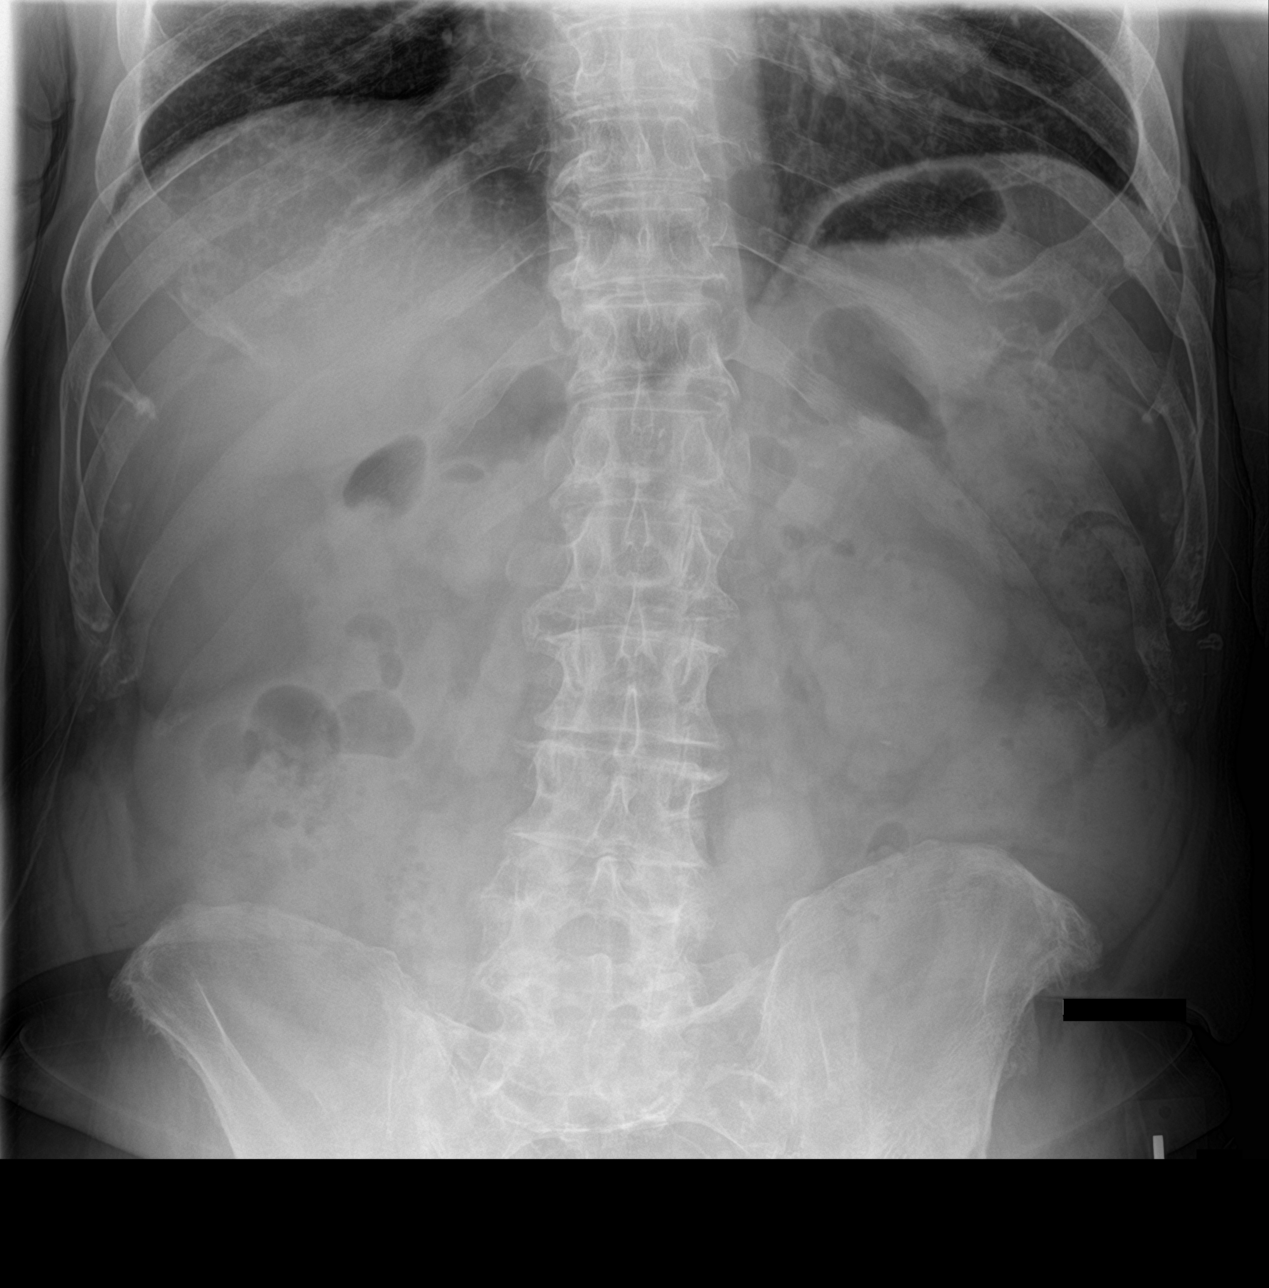
[im 2/3]
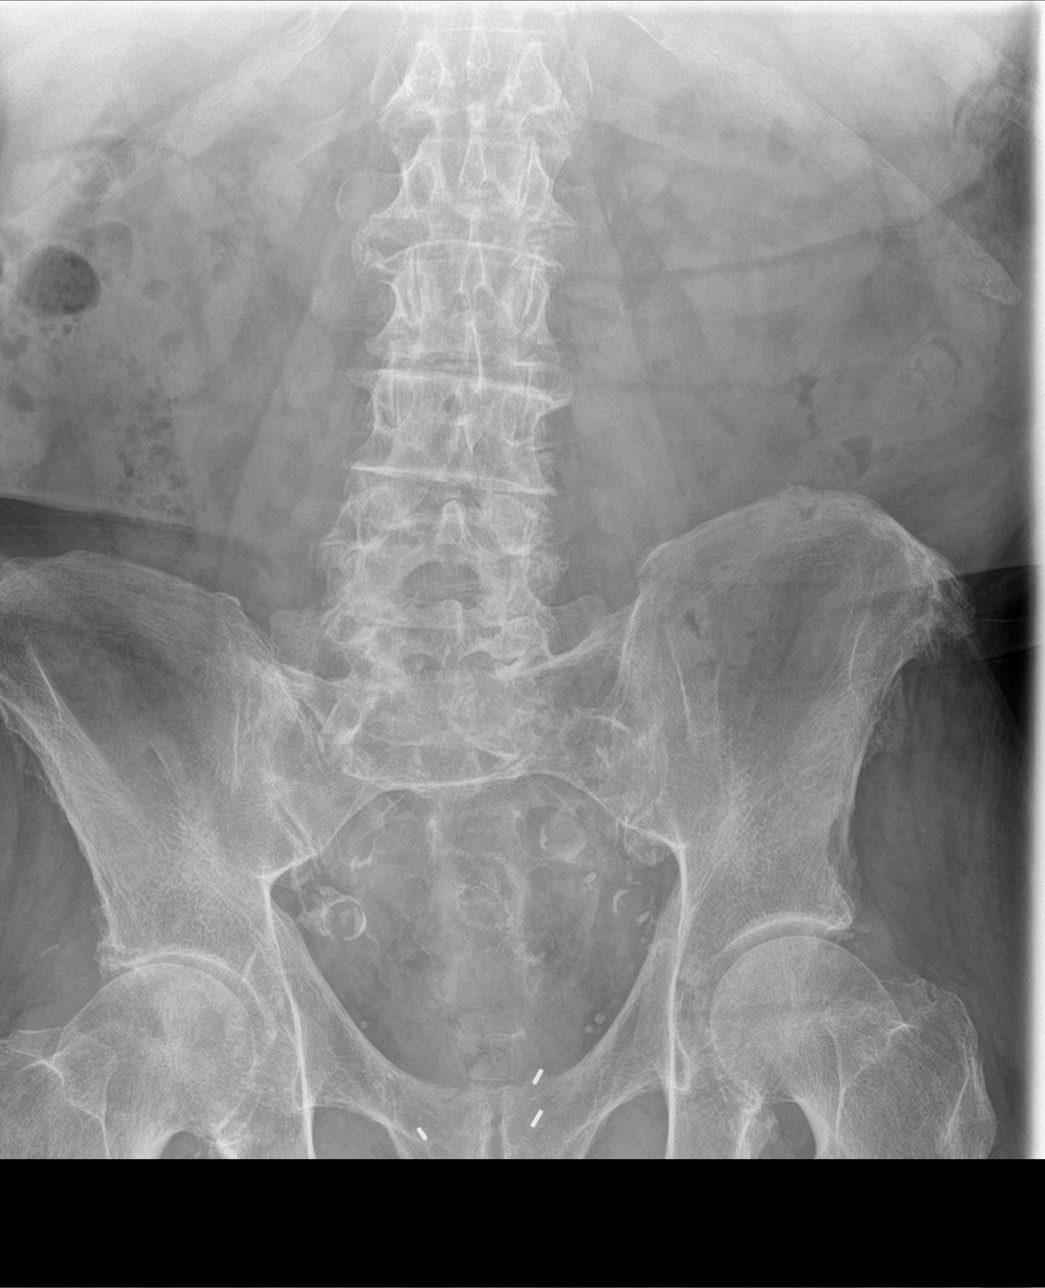
[im 3/3]
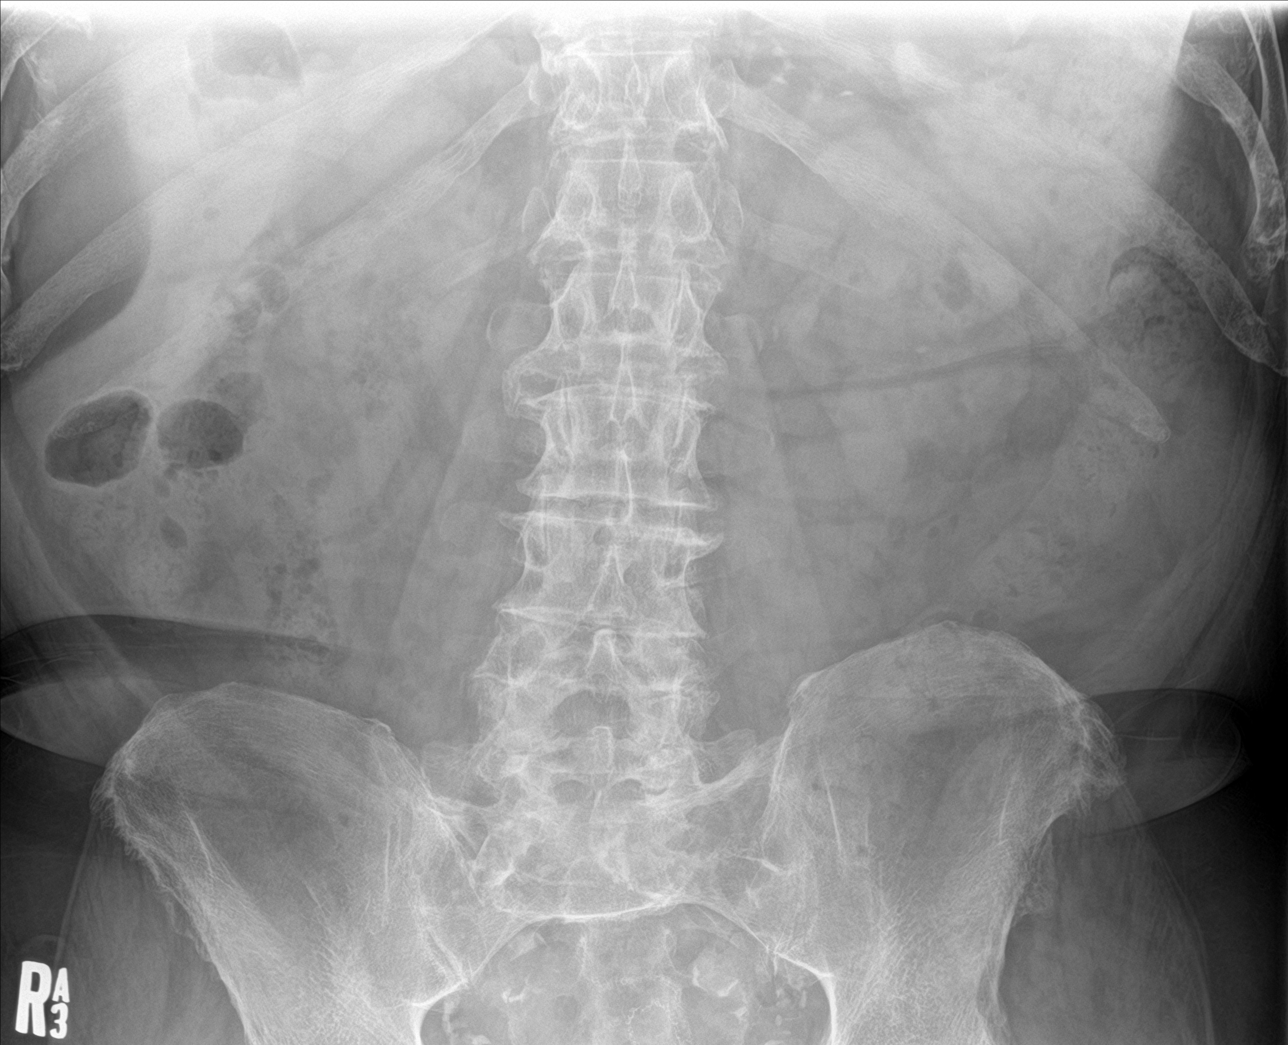

[3 of 3 positions shown; findings below may reference images not displayed]

FINDINGS: Normal bowel gas pattern. No obstruction or ileus. No air-fluid
levels in bowel and no pneumoperitoneum. Surgical anastomosis in the
region of the rectum.

Negative for urinary tract calculi. Atherosclerotic disease. Mild
lumbar scoliosis.
IMPRESSION: Negative.

## 2017-04-26 DIAGNOSIS — L719 Rosacea, unspecified: Secondary | ICD-10-CM | POA: Diagnosis not present

## 2017-04-26 DIAGNOSIS — D179 Benign lipomatous neoplasm, unspecified: Secondary | ICD-10-CM | POA: Diagnosis not present

## 2017-04-26 DIAGNOSIS — L578 Other skin changes due to chronic exposure to nonionizing radiation: Secondary | ICD-10-CM | POA: Diagnosis not present

## 2017-04-26 DIAGNOSIS — Z85828 Personal history of other malignant neoplasm of skin: Secondary | ICD-10-CM | POA: Diagnosis not present

## 2017-04-26 DIAGNOSIS — L570 Actinic keratosis: Secondary | ICD-10-CM | POA: Diagnosis not present

## 2017-04-26 DIAGNOSIS — C44319 Basal cell carcinoma of skin of other parts of face: Secondary | ICD-10-CM | POA: Diagnosis not present

## 2017-04-26 DIAGNOSIS — D485 Neoplasm of uncertain behavior of skin: Secondary | ICD-10-CM | POA: Diagnosis not present

## 2017-04-26 DIAGNOSIS — C4491 Basal cell carcinoma of skin, unspecified: Secondary | ICD-10-CM

## 2017-04-26 HISTORY — DX: Basal cell carcinoma of skin, unspecified: C44.91

## 2017-05-04 ENCOUNTER — Ambulatory Visit (INDEPENDENT_AMBULATORY_CARE_PROVIDER_SITE_OTHER): Payer: Medicare Other | Admitting: General Surgery

## 2017-05-04 ENCOUNTER — Encounter: Payer: Self-pay | Admitting: General Surgery

## 2017-05-04 VITALS — BP 132/85 | HR 89 | Temp 97.6°F | Ht 69.0 in | Wt 196.6 lb

## 2017-05-04 DIAGNOSIS — Z9049 Acquired absence of other specified parts of digestive tract: Secondary | ICD-10-CM

## 2017-05-04 NOTE — Patient Instructions (Signed)
We have placed a referral today to [facility] in regards to your Dumping syndrome (frequent bowel movements). We will call you with an appointment as soon as it has been made. This process typically takes 5-7 business days. If you do not hear from our office after that time period, please give our office a call so that we may check on the status for you.   If you have any questions or concerns please give our office a call.

## 2017-05-04 NOTE — Progress Notes (Signed)
Outpatient Surgical Follow Up  05/04/2017  Cameron Foster. is an 77 y.o. male.   Chief Complaint  Patient presents with  . Routine Post Op    Post Up: Follow Up for Loss of appetite    HPI: 77 year old male returns to clinic for follow-up.  Patient reports his appetite is much improved.  He denies any pain.  He continues to have frequent bowel movements throughout the day.  He states every time he eats he has to rush to the bathroom for an evacuation.  He states that the stool is formed and not watery.  Otherwise he is doing very well and denies any fevers, chills, nausea, vomiting, chest pain, shortness of breath.  Patient is now 6 weeks status post ostomy reversal.  Past Medical History:  Diagnosis Date  . Alcoholism ([place])   . Arrhythmia   . Arthritis   . Asthma   . Atrial fibrillation ([place])    one episode  . Colon polyp   . Depression    Son died 07-Jul-2014  . Diverticulitis   . Diverticulosis 30 years  . Dyspnea   . Dysrhythmia    At Fib 1995  . Fainting   . GERD (gastroesophageal reflux disease)   . History of blood clots 07-08-2015   pt states" clots in my urine"  . Hyperlipidemia   . Hypertension   . Irritable bowel syndrome   . Prostate cancer ([place]) 07/07/10   treated with radiation therapy. Prostate  . Vasovagal syncope   . Vertigo     Past Surgical History:  Procedure Laterality Date  . CARDIAC CATHETERIZATION     [place] no stents  . CATARACT EXTRACTION, BILATERAL    . COLON RESECTION SIGMOID N/A 12/07/2016   Procedure: COLON RESECTION SIGMOID;  Surgeon: Clayburn Pert, MD;  Location: [facility] ORS;  Service: General;  Laterality: N/A;  . COLON SURGERY  11/2016   Colostomy  . COLONOSCOPY  07-07-2014  . COLONOSCOPY WITH PROPOFOL N/A 05/30/2016   Procedure: COLONOSCOPY WITH PROPOFOL;  Surgeon: Lollie Sails, MD;  Location: [facility] ENDOSCOPY;  Service: Endoscopy;  Laterality: N/A;  . COLOSTOMY Left 12/07/2016   Procedure: COLOSTOMY;  Surgeon: Clayburn Pert, MD;   Location: [facility] ORS;  Service: General;  Laterality: Left;  . COLOSTOMY REVERSAL N/A 03/21/2017   Procedure: COLOSTOMY REVERSAL;  Surgeon: Clayburn Pert, MD;  Location: [facility] ORS;  Service: General;  Laterality: N/A;  . COLOSTOMY TAKEDOWN N/A 03/21/2017   Procedure: LAPAROSCOPIC COLOSTOMY TAKEDOWN;  Surgeon: Clayburn Pert, MD;  Location: [facility]S;  Service: General;  Laterality: N/A;  . CYSTOSCOPY WITH STENT PLACEMENT Bilateral 03/21/2017   Procedure: CYSTOSCOPY WITH LIGHTED STENT PLACEMENT;  Surgeon: Abbie Sons, MD;  Location: [facility] ORS;  Service: Urology;  Laterality: Bilateral;  . ESOPHAGOGASTRODUODENOSCOPY    . ESOPHAGOGASTRODUODENOSCOPY N/A 05/30/2016   Procedure: ESOPHAGOGASTRODUODENOSCOPY (EGD);  Surgeon: Lollie Sails, MD;  Location: [facility] ENDOSCOPY;  Service: Endoscopy;  Laterality: N/A;  . EYE SURGERY     cataracts  . FLEXIBLE SIGMOIDOSCOPY N/A 03/21/2017   Procedure: FLEXIBLE SIGMOIDOSCOPY;  Surgeon: Clayburn Pert, MD;  Location: [facility] ORS;  Service: General;  Laterality: N/A;  . FRACTURE SURGERY Bilateral    right arm and left wrist  . INCISION AND DRAINAGE ABSCESS N/A 12/07/2016   Procedure: DRAINAGE  OF INTRA ABDOMINAL ABSCESS;  Surgeon: Clayburn Pert, MD;  Location: [facility] ORS;  Service: General;  Laterality: N/A;  . KNEE ARTHROSCOPY    . LAPAROTOMY N/A 12/07/2016   Procedure: EXPLORATORY LAPAROTOMY;  Surgeon: Clayburn Pert, MD;  Location: [facility] ORS;  Service: General;  Laterality: N/A;  . PROSTATE SURGERY     Microwave therapy  . REPLACEMENT TOTAL KNEE Right 07/2016  . TONSILLECTOMY    . TOTAL KNEE ARTHROPLASTY Right 07/27/2016   Procedure: TOTAL KNEE ARTHROPLASTY;  Surgeon: [name], MD;  Location: [facility] ORS;  Service: Orthopedics;  Laterality: Right;  Dr. Erlene Quan had to place Urinary catheter due to prostate cancer history.  Using flexible scope.    Family History  Problem Relation Age of Onset  . Lung cancer Father   . Other Mother   . Bipolar disorder  Son   . Sudden death Son        due to drug over dose and cardiac issues  . Prostate cancer Neg Hx   . Bladder Cancer Neg Hx     Social History:  reports that he quit smoking about 29 years ago. He quit after 27.00 years of use. His smokeless tobacco use includes chew. He reports that he drinks about 2.4 oz of alcohol per week. He reports that he does not use drugs.  Allergies:  Allergies  Allergen Reactions  . Augmentin [Amoxicillin-Pot Clavulanate] Other (See Comments)    Abdominal upset Has patient had a PCN reaction causing immediate rash, facial/tongue/throat swelling, SOB or lightheadedness with hypotension: Unknown Has patient had a PCN reaction causing severe rash involving mucus membranes or skin necrosis: Unknown Has patient had a PCN reaction that required hospitalization: Unknown Has patient had a PCN reaction occurring within the last 10 years: Unknown If all of the above answers are "NO", then may proceed with Cephalosporin use.   . Formaldehyde Rash  . Tape Rash    Medications reviewed.    ROS  A multipoint review of systems was completed, all pertinent positives and negatives are documented within the HPI and the remainder are negative  BP 132/85   Pulse 89   Temp 97.6 F (36.4 C) (Oral)   Ht 5' 9"  (1.753 m)   Wt 89.2 kg (196 lb 9.6 oz)   BMI 29.03 kg/m   Physical Exam  General: No acute distress Chest: Clear to auscultation  heart: Regular rhythm Abdomen: Soft, nontender, nondistended.  Well-healed abdominal incisions without any evidence of erythema or drainage.   No results found for this or any previous visit (from the past 48 hour(s)). No results found.  Assessment/Plan:  1. Status post partial colectomy 77 year old male status post sigmoid colectomy for diverticulitis that required a Hartmann's.  Also status post ostomy reversal.  Discussed with the patient that his abdominal x-rays that were obtained after last visit were normal.   Continues to have what sounds like dumping syndrome.  Discussed that we would refer to GI for further workup but stated it may be because of his new anatomy.  Patient voiced understanding and will follow-up in the surgery clinic on an as-needed basis.     Clayburn Pert, MD FACS General Surgeon  05/04/2017,2:05 PM

## 2017-05-05 ENCOUNTER — Encounter: Payer: Self-pay | Admitting: General Surgery

## 2017-05-15 ENCOUNTER — Other Ambulatory Visit: Payer: Self-pay

## 2017-05-15 ENCOUNTER — Ambulatory Visit (INDEPENDENT_AMBULATORY_CARE_PROVIDER_SITE_OTHER): Payer: Medicare Other | Admitting: Gastroenterology

## 2017-05-15 ENCOUNTER — Encounter: Payer: Self-pay | Admitting: Gastroenterology

## 2017-05-15 VITALS — BP 110/69 | HR 81 | Ht 69.0 in | Wt 195.2 lb

## 2017-05-15 DIAGNOSIS — K257 Chronic gastric ulcer without hemorrhage or perforation: Secondary | ICD-10-CM | POA: Diagnosis not present

## 2017-05-15 DIAGNOSIS — R198 Other specified symptoms and signs involving the digestive system and abdomen: Secondary | ICD-10-CM

## 2017-05-15 NOTE — Progress Notes (Addendum)
Cameron Foster [address]  [place]  [place], [place] [address]  Main: [phone]  Fax: [phone]   Gastroenterology Consultation  Referring Provider:     Clayburn Pert, MD Primary Care Physician:  Cameron Haven, MD Primary Gastroenterologist:  Cameron Foster Reason for Consultation:     Dumping syndrome        HPI:   Cameron Danowski. is a 77 y.o. y/o male referred for consultation & management  by Cameron Foster, Cameron Adam, MD.  Patient was previously a patient of [facility].  In April 2017 he was found to have diverticulitis with CT showing pericolic inflammatory changes at the descending and sigmoid colon.  In October 2017 repeat CT scan also showed sigmoid diverticulitis.  06-21-2016 CT scan showed resolved sigmoid diverticulitis since October.  In 06/21/2016 a colonoscopy was attempted, extent of exam was sigmoid colon, colonoscope could not be advanced past the site as per Cameron Foster endoscopy report.  Virtual colonoscopy was ordered by [facility], and no significant colonic polypoid lesion, mass, stricture was reported.  In August 2018 patient was found to have new pneumoperitoneum and bowel perforation secondary to diverticulitis and he underwent Hartman's procedure with end colostomy in August 2018 by Cameron Foster.  This was reversed on December 4 by Cameron Foster.  Patient reports since the reversal, he has multiple bowel movements a day.  He has to go to the bathroom within 30-60 minutes after a meal.  Reports 2-3 bowel movements a day.  He states these bowel movements are not loose.  They are formed, and he feels completely evacuated after each bowel movement.  No blood in stool.  No abdominal pain.  No weight loss.  He states he was taking 2 stool softeners a day, every day even after his surgery.  He is started taking only one a day and states his symptoms have much improved since then.  Past Medical History:  Diagnosis  Date  . Alcoholism ([place])   . Arrhythmia   . Arthritis   . Asthma   . Atrial fibrillation ([place])    one episode  . Colon polyp   . Depression    Son died 06/21/2014  . Diverticulitis   . Diverticulosis 30 years  . Dyspnea   . Dysrhythmia    At [facility] 1995  . Fainting   . GERD (gastroesophageal reflux disease)   . History of blood clots 06-22-15   pt states" clots in my urine"  . Hyperlipidemia   . Hypertension   . Irritable bowel syndrome   . Prostate cancer ([place]) 2010/06/21   treated with radiation therapy. Prostate  . Vasovagal syncope   . Vertigo     Past Surgical History:  Procedure Laterality Date  . CARDIAC CATHETERIZATION     [place] no stents  . CATARACT EXTRACTION, BILATERAL    . COLON RESECTION SIGMOID N/A 12/07/2016   Procedure: COLON RESECTION SIGMOID;  Surgeon: Cameron Pert, MD;  Location: [facility] ORS;  Service: General;  Laterality: N/A;  . COLON SURGERY  11/2016   Colostomy  . COLONOSCOPY  Jun 21, 2014  . COLONOSCOPY WITH PROPOFOL N/A 05/30/2016   Procedure: COLONOSCOPY WITH PROPOFOL;  Surgeon: Lollie Sails, MD;  Location: [facility]COPY;  Service: Endoscopy;  Laterality: N/A;  . COLOSTOMY Left 12/07/2016   Procedure: COLOSTOMY;  Surgeon: Cameron Pert, MD;  Location: [facility] ORS;  Service: General;  Laterality: Left;  . COLOSTOMY REVERSAL N/A 03/21/2017   Procedure:  COLOSTOMY REVERSAL;  Surgeon: Cameron Pert, MD;  Location: [facility] ORS;  Service: General;  Laterality: N/A;  . COLOSTOMY TAKEDOWN N/A 03/21/2017   Procedure: LAPAROSCOPIC COLOSTOMY TAKEDOWN;  Surgeon: Cameron Pert, MD;  Location: [facility] ORS;  Service: General;  Laterality: N/A;  . CYSTOSCOPY WITH STENT PLACEMENT Bilateral 03/21/2017   Procedure: CYSTOSCOPY WITH LIGHTED STENT PLACEMENT;  Surgeon: Abbie Sons, MD;  Location: [facility] ORS;  Service: Urology;  Laterality: Bilateral;  . ESOPHAGOGASTRODUODENOSCOPY    . ESOPHAGOGASTRODUODENOSCOPY N/A 05/30/2016   Procedure: ESOPHAGOGASTRODUODENOSCOPY (EGD);   Surgeon: Lollie Sails, MD;  Location: [facility] ENDOSCOPY;  Service: Endoscopy;  Laterality: N/A;  . EYE SURGERY     cataracts  . FLEXIBLE SIGMOIDOSCOPY N/A 03/21/2017   Procedure: FLEXIBLE SIGMOIDOSCOPY;  Surgeon: Cameron Pert, MD;  Location: [facility] ORS;  Service: General;  Laterality: N/A;  . FRACTURE SURGERY Bilateral    right arm and left wrist  . INCISION AND DRAINAGE ABSCESS N/A 12/07/2016   Procedure: DRAINAGE  OF INTRA ABDOMINAL ABSCESS;  Surgeon: Cameron Pert, MD;  Location: [facility]S;  Service: General;  Laterality: N/A;  . KNEE ARTHROSCOPY    . LAPAROTOMY N/A 12/07/2016   Procedure: EXPLORATORY LAPAROTOMY;  Surgeon: Cameron Pert, MD;  Location: [facility] ORS;  Service: General;  Laterality: N/A;  . PROSTATE SURGERY     Microwave therapy  . REPLACEMENT TOTAL KNEE Right 07/2016  . TONSILLECTOMY    . TOTAL KNEE ARTHROPLASTY Right 07/27/2016   Procedure: TOTAL KNEE ARTHROPLASTY;  Surgeon: [name], MD;  Location: [facility] ORS;  Service: Orthopedics;  Laterality: Right;  Dr. Erlene Quan had to place Urinary catheter due to prostate cancer history.  Using flexible scope.    Prior to Admission medications   Medication Sig Start Date End Date Taking? Authorizing Provider  amLODipine (NORVASC) 5 MG tablet Take 1 tablet (5 mg total) by mouth daily. 02/10/16  Yes Cameron Haven, MD  docusate sodium (STOOL SOFTENER) 100 MG capsule Take 100 mg by mouth 2 (two) times daily.   Yes [provider]  fluticasone (FLONASE) 50 MCG/ACT nasal spray Place 1 spray into both nostrils daily as needed for allergies or rhinitis.   Yes [provider]  hydrocortisone 2.5 % cream APPLY  CREAM TO AFFECTED AREA TWICE DAILY AS NEEDED FOR  RASH 11/01/16  Yes Cameron Haven, MD  lisinopril (PRINIVIL,ZESTRIL) 10 MG tablet Take 1 tablet (10 mg total) by mouth daily. 04/24/17  Yes Cameron Haven, MD  liver oil-zinc oxide (DESITIN) 40 % ointment Apply 1 application topically daily as needed  for irritation.   Yes [provider]  metoprolol succinate (TOPROL-XL) 25 MG 24 hr tablet Take 1 tablet (25 mg total) by mouth daily. 02/10/16  Yes Cameron Haven, MD  omeprazole (PRILOSEC) 20 MG capsule Take 20 mg by mouth daily as needed (heartburn).    Yes [provider]  sertraline (ZOLOFT) 100 MG tablet Take 1 tablet (100 mg total) by mouth daily. 01/17/17  Yes Cameron Haven, MD    Family History  Problem Relation Age of Onset  . Lung cancer Father   . Other Mother   . Bipolar disorder Son   . Sudden death Son        due to drug over dose and cardiac issues  . Prostate cancer Neg Hx   . Bladder Cancer Neg Hx      Social History   Tobacco Use  . Smoking status: Former Smoker    Years: 27.00  Last attempt to quit: 04/18/1988    Years since quitting: 29.0  . Smokeless tobacco: Current User    Types: Chew  Substance Use Topics  . Alcohol use: Yes    Alcohol/week: 2.4 oz    Types: 1 Glasses of wine, 3 Standard drinks or equivalent per week    Comment: Occasional- Former Heavy ETOH Use  . Drug use: No    Allergies as of 05/15/2017 - Review Complete 05/15/2017  Allergen Reaction Noted  . Augmentin [amoxicillin-pot clavulanate] Other (See Comments) 01/03/2017  . Formaldehyde Rash 01/20/2015  . Tape Rash 09/07/2016    Review of Systems:    All systems reviewed and negative except where noted in HPI.   Physical Exam:  BP 110/69   Pulse 81   Ht 5' 9"  (1.753 m)   Wt 195 lb 3.2 oz (88.5 kg)   BMI 28.83 kg/m  No LMP for male patient. Psych:  Alert and cooperative. Normal mood and affect. General:   Alert,  Well-developed, well-nourished, pleasant and cooperative in NAD Head:  Normocephalic and atraumatic. Eyes:  Sclera clear, no icterus.   Conjunctiva pink. Ears:  Normal auditory acuity. Nose:  No deformity, discharge, or lesions. Mouth:  No deformity or lesions,oropharynx pink & moist. Neck:  Supple; no masses or thyromegaly. Lungs:   Respirations even and unlabored.  Clear throughout to auscultation.   No wheezes, crackles, or rhonchi. No acute distress. Heart:  Regular rate and rhythm; no murmurs, clicks, rubs, or gallops. Abdomen:  Normal bowel sounds.  No bruits.  Soft, non-tender and non-distended without masses, hepatosplenomegaly or hernias noted.  No guarding or rebound tenderness.    Rectal: Pt. Refused rectal exam Msk:  Symmetrical without gross deformities. Good, equal movement & strength bilaterally. Pulses:  Normal pulses noted. Extremities:  No clubbing or edema.  No cyanosis. Neurologic:  Alert and oriented x3;  grossly normal neurologically. Skin:  Intact without significant lesions or rashes. No jaundice. Lymph Nodes:  No significant cervical adenopathy. Psych:  Alert and cooperative. Normal mood and affect.   Labs: CBC    Component Value Date/Time   WBC 10.4 03/22/2017 0356   RBC 3.81 (L) 03/22/2017 0356   HGB 11.1 (L) 03/22/2017 0356   HCT 32.5 (L) 03/22/2017 0356   PLT 251 03/22/2017 0356   MCV 85.4 03/22/2017 0356   MCH 29.1 03/22/2017 0356   MCHC 34.1 03/22/2017 0356   RDW 14.5 03/22/2017 0356   LYMPHSABS 0.5 (L) 06/17/2016 1116   MONOABS 0.7 06/17/2016 1116   EOSABS 0.1 06/17/2016 1116   BASOSABS 0.1 06/17/2016 1116   CMP     Component Value Date/Time   NA 139 04/19/2017 1550   K 3.9 04/19/2017 1550   CL 104 04/19/2017 1550   CO2 28 04/19/2017 1550   GLUCOSE 79 04/19/2017 1550   BUN 13 04/19/2017 1550   CREATININE 1.22 04/19/2017 1550   CREATININE 1.40 (H) 08/12/2015 1632   CALCIUM 9.4 04/19/2017 1550   PROT 7.1 04/19/2017 1550   ALBUMIN 3.7 04/19/2017 1550   AST 15 04/19/2017 1550   ALT 8 04/19/2017 1550   ALKPHOS 70 04/19/2017 1550   BILITOT 0.4 04/19/2017 1550   GFRNONAA 58 (L) 03/25/2017 0343   GFRAA >60 03/25/2017 0343    Imaging Studies: Dg Abd 2 Views  Result Date: 04/26/2017 CLINICAL DATA:  Loss of appetite EXAM: ABDOMEN - 2 VIEW COMPARISON:  CT abdomen pelvis  12/23/2016 FINDINGS: Normal bowel gas pattern. No obstruction or ileus. No air-fluid levels  in bowel and no pneumoperitoneum. Surgical anastomosis in the region of the rectum. Negative for urinary tract calculi. Atherosclerotic disease. Mild lumbar scoliosis. IMPRESSION: Negative. Electronically Signed   By: Franchot Gallo M.D.   On: 04/26/2017 08:09    Assessment and Plan:   Cameron Tucholski. is a 77 y.o. y/o male with diverticulitis that initially started in April 2017, with perforation in August 2018, requiring colostomy and Hartmann's, with reversal on December 2018 has been referred for evaluation of dumping syndrome after colostomy reversal   Patient denies any loose stools, diarrhea Denies any previous history of loose stools or diarrhea prior to his colostomy or or prior to his colostomy reversal  symptoms have started after the colostomy reversal  Patient does not have any significant diarrhea His symptoms are likely postsurgical, and are already improving after he has decreased his stool softener I have asked him to completely stop his stool softener at this time as he is having no constipation, and is describing regular bowel movements 30-60 minutes after meals.  He has no abdominal pain, or diarrhea to indicate dumping syndrome at this time. He is refusing a rectal exam today He does stated he has a prolapsed hemorrhoid that he has had for years, and has noticed it recently. It is not painful and he is able to put it back in with his finger. He is refusing a rectal exam today, for me to evaluate this. I have asked him to discuss this with surgery and his PCP as well.   In addition, I have given him a handout, that discusses foods that causes increased gas or loose stools, and have asked him to avoid these at this time.  As he gets further out from his surgery, his symptoms are likely to continue to improve.  If his symptoms do not improve, he was asked to call us and further workup  can be done at that time.  We will call follow him in clinic as well to assess symptom improvement after the above measures.  All questions were answered to his satisfaction.  His previous EGD also showed gastric ulcers and biopsies showed H. pylori.  As per [facility] clinic notes this was treated.  He does not have any abdominal pain at this time.  Eradication testing or further EGDs can be considered after acute symptoms resolve.  Patient was agreeable with this plan.  No weight loss or alarm symptoms to indicate urgent EGD at this time.  Dr Cameron Foster

## 2017-05-15 NOTE — Patient Instructions (Signed)
DO NOT TAKE BC POWDER You may take Tylenol/acetaminophen  Follow hand out given, avoid foods that cause increase gas or loose stools. F/U in 3 months. Call if symptoms do not improve.

## 2017-05-21 ENCOUNTER — Telehealth: Payer: Self-pay | Admitting: Family Medicine

## 2017-05-21 NOTE — Telephone Encounter (Signed)
Can you check with the patient to see if he would like to see general surgery for his hemorrhoids?  I received a message from his GI physician regarding this.  Thanks.

## 2017-05-21 NOTE — Telephone Encounter (Signed)
-----   Message from Virgel Manifold, MD sent at 05/15/2017  7:11 PM EST ----- Hi Dr. Caryl Bis,  I saw this patient today and I will continue to see him in clinic to see if his symptoms improve after stopping his stool softeners he is still taking. He mentioned a prolapsed hemorrhoid, that is not painful, and he states it has present for years. He refused a rectal exam today. I have asked him to discuss this with surgery as well, especially if it becomes more symptomatic. Could you discuss it with him next visit too. He was being seen my surgery post his colostomy referral, not sure if he needs another referral to them for hemorrhoids. Thank You.

## 2017-05-22 NOTE — Telephone Encounter (Signed)
Patient does not want to see general surgery at this time

## 2017-05-22 NOTE — Telephone Encounter (Signed)
Noted  

## 2017-05-25 ENCOUNTER — Telehealth: Payer: Self-pay | Admitting: Family Medicine

## 2017-05-25 MED ORDER — OSELTAMIVIR PHOSPHATE 30 MG PO CAPS: 30.0000 mg | ORAL_CAPSULE | Freq: Every day | ORAL | 0 refills | Status: DC

## 2017-05-25 NOTE — Telephone Encounter (Signed)
Noted. Sent to pharmacy with dosing based on patients creatinine clearance. If he starts to have symptoms he should let us know. He could start the medication prophylactically given his exposure to try to prevent getting symptoms.

## 2017-05-25 NOTE — Telephone Encounter (Signed)
Patient states he was exposed on Sunday and the person was diagnosed on Monday. Informed of possible psychosis. He states he would like an rx just in case they start having symptoms but they will not pick up the rx unless they do develop symptoms.

## 2017-05-25 NOTE — Telephone Encounter (Signed)
Patient had flu shot on 01/26/17

## 2017-05-25 NOTE — Telephone Encounter (Signed)
Copied from [place] [id number]. Topic: General - Other >> May 25, 2017  8:47 AM Yvette Rack wrote: Reason for CRM: pt calling stating that he has been exposed to the Flu his brother in law has it and pt can't afford to get sick his wife can't be exposed to any illness he would like to get the Tamiflu please send to the   [phone](Phone) [phone](Fax)

## 2017-05-25 NOTE — Telephone Encounter (Signed)
Please see when he was exposed and if he is having any symptoms.  Please let him know that there are risks of taking Tamiflu such as issues with psychosis.  I am happy to prescribe this though I wanted him to be aware of this.

## 2017-05-25 NOTE — Telephone Encounter (Signed)
Please advise 

## 2017-05-26 NOTE — Telephone Encounter (Signed)
Patient notified

## 2017-06-06 DIAGNOSIS — D179 Benign lipomatous neoplasm, unspecified: Secondary | ICD-10-CM | POA: Diagnosis not present

## 2017-06-06 DIAGNOSIS — L820 Inflamed seborrheic keratosis: Secondary | ICD-10-CM | POA: Diagnosis not present

## 2017-06-06 DIAGNOSIS — C44319 Basal cell carcinoma of skin of other parts of face: Secondary | ICD-10-CM | POA: Diagnosis not present

## 2017-07-11 DIAGNOSIS — D485 Neoplasm of uncertain behavior of skin: Secondary | ICD-10-CM | POA: Diagnosis not present

## 2017-07-11 DIAGNOSIS — L980 Pyogenic granuloma: Secondary | ICD-10-CM | POA: Diagnosis not present

## 2017-07-18 DIAGNOSIS — C44729 Squamous cell carcinoma of skin of left lower limb, including hip: Secondary | ICD-10-CM | POA: Diagnosis not present

## 2017-07-18 DIAGNOSIS — C4492 Squamous cell carcinoma of skin, unspecified: Secondary | ICD-10-CM

## 2017-07-18 DIAGNOSIS — L980 Pyogenic granuloma: Secondary | ICD-10-CM | POA: Diagnosis not present

## 2017-07-18 HISTORY — DX: Squamous cell carcinoma of skin, unspecified: C44.92

## 2017-07-23 ENCOUNTER — Other Ambulatory Visit: Payer: Self-pay | Admitting: Family Medicine

## 2017-08-24 ENCOUNTER — Encounter: Payer: Self-pay | Admitting: Urology

## 2017-08-24 ENCOUNTER — Ambulatory Visit: Payer: Medicare Other

## 2017-08-24 ENCOUNTER — Ambulatory Visit (INDEPENDENT_AMBULATORY_CARE_PROVIDER_SITE_OTHER): Payer: Medicare Other | Admitting: Urology

## 2017-08-24 VITALS — BP 126/79 | HR 76 | Ht 69.0 in | Wt 203.2 lb

## 2017-08-24 DIAGNOSIS — C61 Malignant neoplasm of prostate: Secondary | ICD-10-CM | POA: Diagnosis not present

## 2017-08-24 NOTE — Progress Notes (Signed)
08/24/2017 3:11 PM   [place]. Dec 19, 1940 [id number]  Referring provider: Leone Haven, MD [address] [place], [place] [address]  Chief Complaint  Patient presents with  . Prostate Cancer    HPI: The patient is a 77 year old gentleman who presents today for annual follow-up.  1 - Moderate Risk Prostate Cancer - s/p external beam + 74mo andogen deprivation for Gleason 7 disease 224-Mar-2012. Due for PSA today. Recent Surveillance: 08/2016 PSA < 0.1  2 - Erectile Dysfunction - inability to achieve erection x years. Used to use PDE5i but now minimaly sexually active and not big priority.  3 - Lower Urinary Tract Symptoms - long h/o mix of obstrutive and irritative symptoms even pre-radiation therapy.  Previous PVRs have been low.  He is not bothered by his symptoms at this time.  He does note some post void dribbling but is not concerned by this.  He feels he empties his bladder with a good stream.  He  Patient has also been seen in the hospital in the last year by urology for difficult Foley requiring cystoscopic placement.  He also had a colostomy takedown where lighted ureteral stents were placed.  These were removed postoperatively.  4 - Gross Hematuria / Clot Retention - history of gross hematuria / retention 07/2015 prompting bedside cysto / clot evacuation by Dr. WJeffie [place] NO large bladder masses, but cysto done at time of large volume clot. CT showed bilateral renal cysts (largest 6.5 cm) and bladder wall thickening.  He also had a cystoscopy and December 2018 when his temporary lead ureteral stents were placed.  There is bladder trabeculation but no masses.  No recurrent episodes of gross hematuria.   PMH: Past Medical History:  Diagnosis Date  . Alcoholism ([place]   . Arrhythmia   . Arthritis   . Asthma   . Atrial fibrillation ([place]    one episode  . Colon polyp   . Depression    Son died 203/24/16 . Diverticulitis   . Diverticulosis 30 years  .  Dyspnea   . Dysrhythmia    At [facility] 1995  . Fainting   . GERD (gastroesophageal reflux disease)   . History of blood clots 203/25/17  pt states" clots in my urine"  . Hyperlipidemia   . Hypertension   . Irritable bowel syndrome   . [place]state cancer ([place] 2[place] 24, [place]  treated with radiation therapy. Prostate  . Vasovagal syncope   . Vertigo     Surgical History: Past Surgical History:  Procedure Laterality Date  . CARDIAC CATHETERIZATION     [place] no stents  . CATARACT EXTRACTION, BILATERAL    . COLON RESECTION SIGMOID N/A 12/07/2016   Procedure: COLON RESECTION SIGMOID;  Surgeon: WClayburn Pert MD;  Location: [facility];  Service: General;  Laterality: N/A;  . COLON SURGERY  11/2016   Colostomy  . COLONOSCOPY  203/24/16 . COLONOSCOPY WITH PROPOFOL N/A 05/30/2016   Procedure: COLONOSCOPY WITH PROPOFOL;  Surgeon: MLollie Sails MD;  [facility];  Service: Endoscopy;  Laterality: N/A;  . COLOSTOMY Left 12/07/2016   Procedure: COLOSTOMY;  Surgeon: WClayburn Pert MD;  Location: [facility];  Service: General;  Laterality: Left;  . COLOSTOMY REVERSAL N/A 03/21/2017   Procedure: COLOSTOMY REVERSAL;  Surgeon: WClayburn Pert MD;  Location: [facility];  Service: General;  Laterality: N/A;  . COLOSTOMY TAKEDOWN N/A 03/21/2017   Procedure: LAPAROSCOPIC COLOSTOMY TAKEDOWN;  Surgeon: WClayburn Pert MD;  Location: [facility];  Service: General;  Laterality: N/A;  . CYSTOSCOPY WITH STENT PLACEMENT Bilateral 03/21/2017   Procedure: CYSTOSCOPY WITH LIGHTED STENT PLACEMENT;  Surgeon: Abbie Sons, MD;  Location: [facility];  Service: Urology;  Laterality: Bilateral;  . ESOPHAGOGASTRODUODENOSCOPY    . ESOPHAGOGASTRODUODENOSCOPY N/A 05/30/2016   Procedure: ESOPHAGOGASTRODUODENOSCOPY (EGD);  Surgeon: Lollie Sails, MD;  Location[facility];  Service: Endoscopy;  Laterality: N/A;  . EYE SURGERY     cataracts  . FLEXIBLE SIGMOIDOSCOPY N/A 03/21/2017   Procedure: FLEXIBLE  SIGMOIDOSCOPY;  Surgeon: Clayburn Pert, MD;  Location: [facility];  Service: General;  Laterality: N/A;  . FRACTURE SURGERY Bilateral    right arm and left wrist  . INCISION AND DRAINAGE ABSCESS N/A 12/07/2016   Procedure: DRAINAGE  OF INTRA ABDOMINAL ABSCESS;  Surgeon: Clayburn Pert, MD;  Location: [facility];  Service: General;  Laterality: N/A;  . KNEE ARTHROSCOPY    . LAPAROTOMY N/A 12/07/2016   Procedure: EXPLORATORY LAPAROTOMY;  Surgeon: Clayburn Pert, MD;  Location: [facility];  Service: General;  Laterality: N/A;  . PROSTATE SURGERY     Microwave therapy  . REPLACEMENT TOTAL KNEE Right 07/2016  . TONSILLECTOMY    . TOTAL KNEE ARTHROPLASTY Right 07/27/2016   Procedure: TOTAL KNEE ARTHROPLASTY;  Surgeon: [name], MD;  Location: [facility];  Service: Orthopedics;  Laterality: Right;  Dr. Erlene Quan had to place Urinary catheter due to prostate cancer history.  Using flexible scope.    Home Medications:  Allergies as of 08/24/2017      Reactions   Augmentin [amoxicillin-pot Clavulanate] Other (See Comments)   Abdominal upset Has patient had a PCN reaction causing immediate rash, facial/tongue/throat swelling, SOB or lightheadedness with hypotension: Unknown Has patient had a PCN reaction causing severe rash involving mucus membranes or skin necrosis: Unknown Has patient had a PCN reaction that required hospitalization: Unknown Has patient had a PCN reaction occurring within the last 10 years: Unknown If all of the above answers are "NO", then may proceed with Cephalosporin use.   Formaldehyde Rash   Tape Rash   Pt states he is not allergic to tape.      Medication List        Accurate as of 08/24/17  3:11 PM. Always use your most recent med list.          amLODipine 5 MG tablet Commonly known as:  NORVASC Take 1 tablet (5 mg total) by mouth daily.   fluticasone 50 MCG/ACT nasal spray Commonly known as:  FLONASE Place 1 spray into both nostrils daily as needed for  allergies or rhinitis.   hydrocortisone 2.5 % cream APPLY  CREAM TO AFFECTED AREA TWICE DAILY AS NEEDED FOR  RASH   lisinopril 10 MG tablet Commonly known as:  PRINIVIL,ZESTRIL Take 1 tablet (10 mg total) by mouth daily.   liver oil-zinc oxide 40 % ointment Commonly known as:  DESITIN Apply 1 application topically daily as needed for irritation.   metoprolol succinate 25 MG 24 hr tablet Commonly known as:  TOPROL-XL Take 1 tablet (25 mg total) by mouth daily.   omeprazole 20 MG capsule Commonly known as:  PRILOSEC Take 20 mg by mouth daily as needed (heartburn).   sertraline 100 MG tablet Commonly known as:  ZOLOFT TAKE 1 TABLET BY MOUTH DAILY   STOOL SOFTENER 100 MG capsule Generic drug:  docusate sodium Take 100 mg by mouth 2 (two) times daily.       Allergies:  Allergies  Allergen Reactions  .  Augmentin [Amoxicillin-Pot Clavulanate] Other (See Comments)    Abdominal upset Has patient had a PCN reaction causing immediate rash, facial/tongue/throat swelling, SOB or lightheadedness with hypotension: Unknown Has patient had a PCN reaction causing severe rash involving mucus membranes or skin necrosis: Unknown Has patient had a PCN reaction that required hospitalization: Unknown Has patient had a PCN reaction occurring within the last 10 years: Unknown If all of the above answers are "NO", then may proceed with Cephalosporin use.   . Formaldehyde Rash  . Tape Rash    Pt states he is not allergic to tape.    Family History: Family History  Problem Relation Age of Onset  . Lung cancer Father   . Other Mother   . Bipolar disorder Son   . Sudden death Son        due to drug over dose and cardiac issues  . Prostate cancer Neg Hx   . Bladder Cancer Neg Hx     Social History:  reports that he quit smoking about 29 years ago. He quit after 27.00 years of use. His smokeless tobacco use includes chew. He reports that he drinks about 2.4 oz of alcohol per week. He  reports that he does not use drugs.  ROS: UROLOGY Frequent Urination?: No Hard to postpone urination?: No Burning/pain with urination?: No Get up at night to urinate?: Yes Leakage of urine?: Yes Urine stream starts and stops?: No Trouble starting stream?: No Do you have to strain to urinate?: No Blood in urine?: No Urinary tract infection?: No Sexually transmitted disease?: No Injury to kidneys or bladder?: No Painful intercourse?: No Weak stream?: No Erection problems?: Yes Penile pain?: No  Gastrointestinal Nausea?: No Vomiting?: No Indigestion/heartburn?: No Diarrhea?: No Constipation?: No  Constitutional Fever: No Night sweats?: No Weight loss?: No Fatigue?: No  Skin Skin rash/lesions?: No Itching?: Yes  Eyes Blurred vision?: No Double vision?: No  Ears/Nose/Throat Sore throat?: No Sinus problems?: Yes  Hematologic/Lymphatic Swollen glands?: No Easy bruising?: No  Cardiovascular Leg swelling?: Yes Chest pain?: No  Respiratory Cough?: No Shortness of breath?: No  Endocrine Excessive thirst?: No  Musculoskeletal Back pain?: Yes Joint pain?: No  Neurological Headaches?: No Dizziness?: No  Psychologic Depression?: No Anxiety?: No  Physical Exam: BP 126/79 (BP Location: Right Arm, Patient Position: Sitting, Cuff Size: Large)   Pulse 76   Ht 5' 9"  (1.753 m)   Wt 203 lb 3.2 oz (92.2 kg)   BMI 30.01 kg/m   Constitutional:  Alert and oriented, No acute distress. HEENT: [place] AT, moist mucus membranes.  Trachea midline, no masses. Cardiovascular: No clubbing, cyanosis, or edema. Respiratory: Normal respiratory effort, no increased work of breathing. GI: Abdomen is soft, nontender, nondistended, no abdominal masses GU: No CVA tenderness.  Skin: No rashes, bruises or suspicious lesions. Lymph: No cervical or inguinal adenopathy. Neurologic: Grossly intact, no focal deficits, moving all 4 extremities. Psychiatric: Normal mood and  affect.  Laboratory Data: Lab Results  Component Value Date   WBC 10.4 03/22/2017   HGB 11.1 (L) 03/22/2017   HCT 32.5 (L) 03/22/2017   MCV 85.4 03/22/2017   PLT 251 03/22/2017    Lab Results  Component Value Date   CREATININE 1.22 04/19/2017    Lab Results  Component Value Date   PSA 0.05 (L) 07/30/2015   PSA 0.09 (L) 01/20/2015    No results found for: TESTOSTERONE  Lab Results  Component Value Date   HGBA1C 5.2 03/15/2017    Urinalysis  Component Value Date/Time   COLORURINE STRAW (A) 12/23/2016 1755   APPEARANCEUR CLEAR (A) 12/23/2016 1755   APPEARANCEUR Clear 08/13/2015 1631   LABSPEC 1.006 12/23/2016 1755   PHURINE 6.0 12/23/2016 1755   GLUCOSEU NEGATIVE 12/23/2016 1755   HGBUR NEGATIVE 12/23/2016 1755   BILIRUBINUR small 04/19/2017 1559   BILIRUBINUR Negative 08/13/2015 1631   KETONESUR NEGATIVE 12/23/2016 1755   PROTEINUR 30 04/19/2017 1559   PROTEINUR NEGATIVE 12/23/2016 1755   UROBILINOGEN 0.2 04/19/2017 1559   NITRITE negative 04/19/2017 1559   NITRITE NEGATIVE 12/23/2016 1755   LEUKOCYTESUR Negative 04/19/2017 1559   LEUKOCYTESUR Trace (A) 08/13/2015 1631    Assessment & Plan:    1 - Moderate Risk Prostate Cancer - Due for PSA today. Will call patient with results. Follow up in one year.   2 - Lower Urinary Tract Symptoms - Minimal at this time. No treatment needed  No follow-ups on file.  Nickie Retort, MD  [facility] [address] [place], [place] [address] [phone]

## 2017-08-25 ENCOUNTER — Telehealth: Payer: Self-pay

## 2017-08-25 LAB — PSA: Prostate Specific Ag, Serum: <0.1 ng/mL (ref 0.0–4.0)

## 2017-08-25 NOTE — Telephone Encounter (Signed)
-----   Message from Nickie Retort, MD sent at 08/25/2017  9:15 AM EDT ----- Please let patient know his PSA is undetectable, so there is no sign of prostate cancer. Follow up as scheduled Thanks  ----- Message ----- From: Garnette Gunner, CMA Sent: 08/25/2017   8:37 AM To: Nickie Retort, MD    ----- Message ----- From: Interface, [facility] Lab Results In Sent: 08/25/2017   5:40 AM To: [facility]

## 2017-08-25 NOTE — Telephone Encounter (Signed)
Pt informed

## 2017-08-28 ENCOUNTER — Other Ambulatory Visit: Payer: Medicare Other

## 2017-08-28 ENCOUNTER — Encounter: Payer: Self-pay | Admitting: Gastroenterology

## 2017-08-28 ENCOUNTER — Other Ambulatory Visit: Payer: Self-pay

## 2017-08-28 ENCOUNTER — Ambulatory Visit (INDEPENDENT_AMBULATORY_CARE_PROVIDER_SITE_OTHER): Payer: Medicare Other | Admitting: Gastroenterology

## 2017-08-28 VITALS — BP 128/79 | HR 76 | Temp 98.1°F | Ht 69.0 in | Wt 200.4 lb

## 2017-08-28 DIAGNOSIS — R1030 Lower abdominal pain, unspecified: Secondary | ICD-10-CM

## 2017-08-29 ENCOUNTER — Telehealth: Payer: Self-pay | Admitting: Gastroenterology

## 2017-08-29 ENCOUNTER — Other Ambulatory Visit: Payer: Self-pay | Admitting: Family Medicine

## 2017-08-29 LAB — URINALYSIS, ROUTINE W REFLEX MICROSCOPIC
Bilirubin, UA: NEGATIVE
GLUCOSE, UA: NEGATIVE
KETONES UA: NEGATIVE
LEUKOCYTES UA: NEGATIVE
NITRITE UA: NEGATIVE
PH UA: 5 (ref 5.0–7.5)
RBC, UA: NEGATIVE
Specific Gravity, UA: 1.023 (ref 1.005–1.030)
Urobilinogen, Ur: 0.2 mg/dL (ref 0.2–1.0)

## 2017-08-29 NOTE — Telephone Encounter (Signed)
Please find out what he is taking this for.  Thanks.

## 2017-08-29 NOTE — Telephone Encounter (Signed)
Pt's CT of abdomen and pelvis scheduled for May 21st at 11:00am. Will need lab (creatinine) done also and appt at 10:00am for this. To pick up contrast a day or two prior to procedure. Drink the first bottle at 9am and drink the second bottle at 10:00am. Also to be NPO 4hr. Prior to procedure.

## 2017-08-29 NOTE — Telephone Encounter (Signed)
Last OV 04/19/17 last filled 11/01/16 28 g 1rf

## 2017-08-29 NOTE — Telephone Encounter (Signed)
Pt left vm he states  He is a pt of Dr. Bonna Gains and needs some information please call

## 2017-08-30 LAB — URINE CULTURE: ORGANISM ID, BACTERIA: NO GROWTH

## 2017-08-30 NOTE — Telephone Encounter (Signed)
Left message to return call, ok for pec to speak to patient and ask what he takes the hydrocortisone cream for.

## 2017-08-31 NOTE — Telephone Encounter (Signed)
Please advise 

## 2017-08-31 NOTE — Progress Notes (Signed)
Vonda Antigua, MD [address]  [place]  [place], [place] [address]  Main: [phone]  Fax: [phone]   Primary Care Physician: Leone Haven, MD  Primary Gastroenterologist:  Dr. Vonda Antigua  Chief Complaint  Patient presents with  . Establish Care    referral from Dr. Arlana Lindau, MD stomach pain-more so of left side    HPI: Cameron Foster. is a 77 y.o. male here for abdominal pain.  Describes it to be bilateral lower quadrant and suprapubic.  Started a few weeks 2 months ago.  Occurring multiple times a day.  No relation to meals.  Worsens with certain movements.  Denies any burning or pain with urination, but reports has had some difficulty initiating urination.  Sees urology, there last note on May 9 reports "lower urinary tract symptoms minimal at this time no treatment needed."   Previous history:  Patient was previously a patient of [facility].  In April 2017 he was found to have diverticulitis with CT showing pericolic inflammatory changes at the descending and sigmoid colon.  In October 2017 repeat CT scan also showed sigmoid diverticulitis.  February 2018 CT scan showed resolved sigmoid diverticulitis since October.  In February 2018 a colonoscopy was attempted, extent of exam was sigmoid colon, colonoscope could not be advanced past the site as per Dr. Marton Redwood endoscopy report.  Virtual colonoscopy was ordered by [facility], and no significant colonic polypoid lesion, mass, stricture was reported.  In August 2018 patient was found to have new pneumoperitoneum and bowel perforation secondary to diverticulitis and he underwent Hartman's procedure with end colostomy in August 2018 by Dr. Adonis Huguenin.  This was reversed on December 4 by Dr. Adonis Huguenin.  His previous EGD also showed gastric ulcers and biopsies showed H. pylori.  As per [facility] clinic notes this was treated.    Current Outpatient Medications  Medication Sig Dispense  Refill  . amLODipine (NORVASC) 5 MG tablet Take 1 tablet (5 mg total) by mouth daily. 90 tablet 3  . docusate sodium (STOOL SOFTENER) 100 MG capsule Take 100 mg by mouth 2 (two) times daily.    . fluticasone (FLONASE) 50 MCG/ACT nasal spray Place 1 spray into both nostrils daily as needed for allergies or rhinitis.    . hydrocortisone 2.5 % cream APPLY  CREAM TO AFFECTED AREA TWICE DAILY AS NEEDED FOR  RASH 28 g 1  . lisinopril (PRINIVIL,ZESTRIL) 10 MG tablet Take 1 tablet (10 mg total) by mouth daily. 90 tablet 1  . metoprolol succinate (TOPROL-XL) 25 MG 24 hr tablet Take 1 tablet (25 mg total) by mouth daily. 90 tablet 3  . omeprazole (PRILOSEC) 20 MG capsule Take 20 mg by mouth daily as needed (heartburn).     . sertraline (ZOLOFT) 100 MG tablet TAKE 1 TABLET BY MOUTH DAILY 90 tablet 0  . liver oil-zinc oxide (DESITIN) 40 % ointment Apply 1 application topically daily as needed for irritation.     No current facility-administered medications for this visit.     Allergies as of 08/28/2017 - Review Complete 08/28/2017  Allergen Reaction Noted  . Augmentin [amoxicillin-pot clavulanate] Other (See Comments) 01/03/2017  . Formaldehyde Rash 01/20/2015  . Tape Rash 09/07/2016    ROS:  General: Negative for anorexia, weight loss, fever, chills, fatigue, weakness. ENT: Negative for hoarseness, difficulty swallowing , nasal congestion. CV: Negative for chest pain, angina, palpitations, dyspnea on exertion, peripheral edema.  Respiratory: Negative for dyspnea at rest, dyspnea on  exertion, cough, sputum, wheezing.  GI: See history of present illness. GU:  Negative for dysuria, hematuria, urinary incontinence, urinary frequency, nocturnal urination.  Endo: Negative for unusual weight change.    Physical Examination:   BP 128/79   Pulse 76   Temp 98.1 F (36.7 C) (Oral)   Ht 5' 9"  (1.753 m)   Wt 200 lb 6.4 oz (90.9 kg)   BMI 29.59 kg/m   General: Well-nourished, well-developed in no  acute distress.  Eyes: No icterus. Conjunctivae pink. Mouth: Oropharyngeal mucosa moist and pink , no lesions erythema or exudate. Neck: Supple, Trachea midline Abdomen: Bowel sounds are normal, nontender, nondistended, no hepatosplenomegaly or masses, no abdominal bruits or hernia , no rebound or guarding.   Extremities: No lower extremity edema. No clubbing or deformities. Neuro: Alert and oriented x 3.  Grossly intact. Skin: Warm and dry, no jaundice.   Psych: Alert and cooperative, normal mood and affect.   Labs: CMP     Component Value Date/Time   NA 139 04/19/2017 1550   K 3.9 04/19/2017 1550   CL 104 04/19/2017 1550   CO2 28 04/19/2017 1550   GLUCOSE 79 04/19/2017 1550   BUN 13 04/19/2017 1550   CREATININE 1.22 04/19/2017 1550   CREATININE 1.40 (H) 08/12/2015 1632   CALCIUM 9.4 04/19/2017 1550   PROT 7.1 04/19/2017 1550   ALBUMIN 3.7 04/19/2017 1550   AST 15 04/19/2017 1550   ALT 8 04/19/2017 1550   ALKPHOS 70 04/19/2017 1550   BILITOT 0.4 04/19/2017 1550   GFRNONAA 58 (L) 03/25/2017 0343   GFRAA >60 03/25/2017 0343   Lab Results  Component Value Date   WBC 10.4 03/22/2017   HGB 11.1 (L) 03/22/2017   HCT 32.5 (L) 03/22/2017   MCV 85.4 03/22/2017   PLT 251 03/22/2017    Imaging Studies: No results found.  Assessment and Plan:   Cameron Foster. is a 77 y.o. y/o male here for bilateral lower quadrant abdominal pain  We will obtain UA to rule out urinary infection Due to patient's abdominal pain, and previous history of diverticulitis, will obtain CT abdomen to evaluate any underlying etiologies Patient is asked to avoid NSAIDs Continue to maintain soft stool with a high-fiber diet  After above testing is complete, can order further interventions, and change management as appropriate  Dr Vonda Antigua

## 2017-08-31 NOTE — Telephone Encounter (Signed)
Called Cameron Foster and he states that he is using the hydrocortisone 2.5% for his groin area. Sometimes he sweats and develops a rash and he uses it there.

## 2017-09-04 ENCOUNTER — Ambulatory Visit: Payer: PRIVATE HEALTH INSURANCE

## 2017-09-05 ENCOUNTER — Ambulatory Visit
Admission: RE | Admit: 2017-09-05 | Discharge: 2017-09-05 | Disposition: A | Discharge status: Home / Self Care | Payer: Medicare Other | Source: Ambulatory Visit | Attending: Gastroenterology | Admitting: Gastroenterology

## 2017-09-05 ENCOUNTER — Telehealth: Payer: Self-pay

## 2017-09-05 DIAGNOSIS — Z9049 Acquired absence of other specified parts of digestive tract: Secondary | ICD-10-CM | POA: Diagnosis not present

## 2017-09-05 DIAGNOSIS — I700 Atherosclerosis of aorta: Secondary | ICD-10-CM | POA: Diagnosis not present

## 2017-09-05 DIAGNOSIS — K5730 Diverticulosis of large intestine without perforation or abscess without bleeding: Secondary | ICD-10-CM | POA: Insufficient documentation

## 2017-09-05 DIAGNOSIS — R1030 Lower abdominal pain, unspecified: Secondary | ICD-10-CM | POA: Insufficient documentation

## 2017-09-05 LAB — POCT I-STAT CREATININE: Creatinine, Ser: 1.3 mg/dL — ABNORMAL HIGH (ref 0.61–1.24)

## 2017-09-05 IMAGING — CT CT ABD-PELV W/ CM
2 of 5 series · 16 of 46 positions shown, 18 images · IV contrast (iopamidol)
Comparison: CT abdomen pelvis dated [DATE].

CLINICAL DATA: Intermittent lower abdominal pain for the past 2
months.

EXAM:
CT ABDOMEN AND PELVIS WITH CONTRAST
TECHNIQUE: Multidetector CT imaging of the abdomen and pelvis was performed
using the standard protocol following bolus administration of
intravenous contrast.
CONTRAST:  100mL [0Z] IOPAMIDOL ([0Z]) INJECTION 61%

[Series 2: abd pelvis · axial · 0.75mm/px · z∈[-1672,-1232]mm · 13 of 100 slices shown, 15 images (1 of 2)]
[im 6/100  soft-tissue]
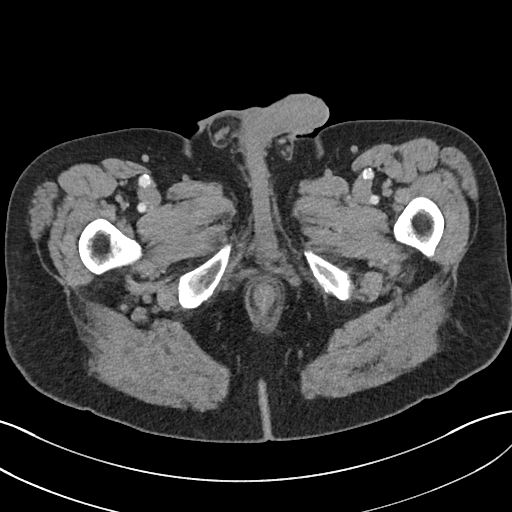
[im 6/100  bone]
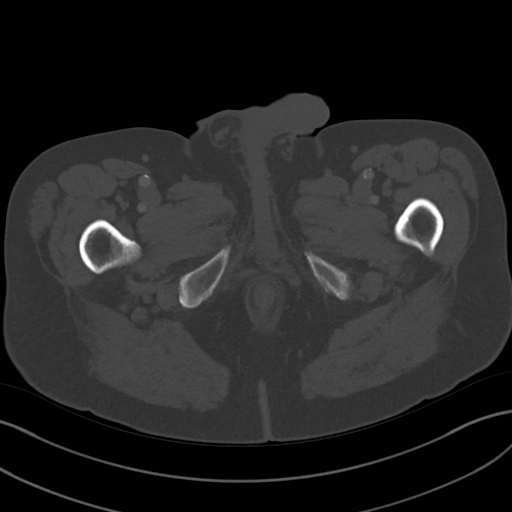
[im 12/100  soft-tissue]
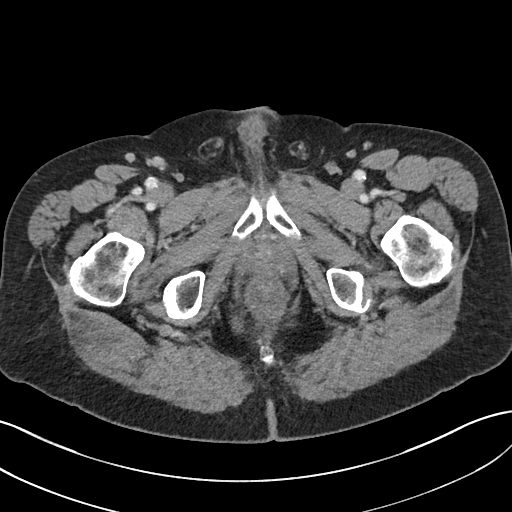
[im 24/100  soft-tissue]
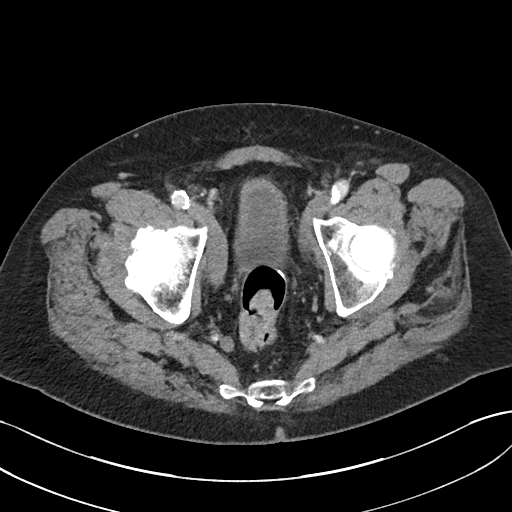
[im 30/100  soft-tissue]
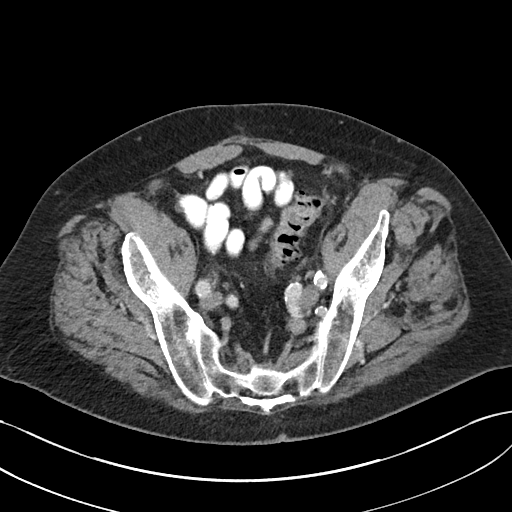
[im 35/100  soft-tissue]
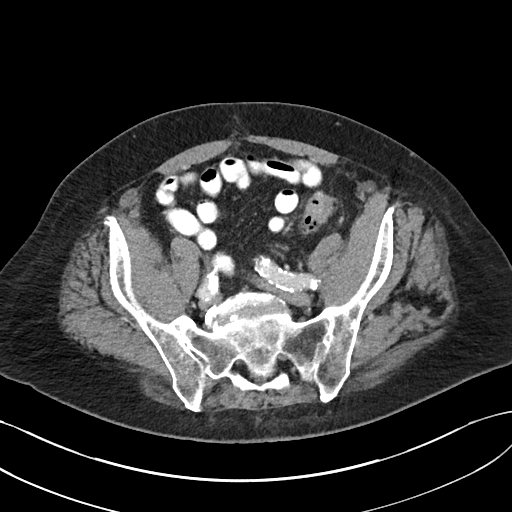
[im 41/100  soft-tissue]
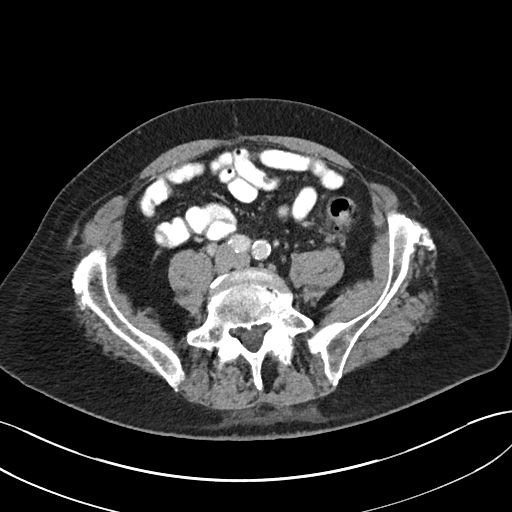
[im 53/100  soft-tissue]
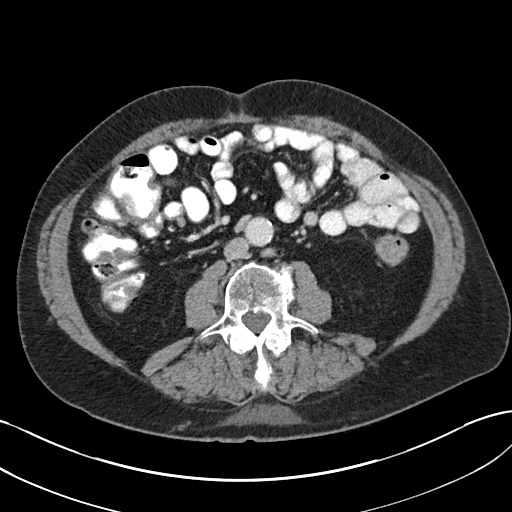
[im 59/100  soft-tissue]
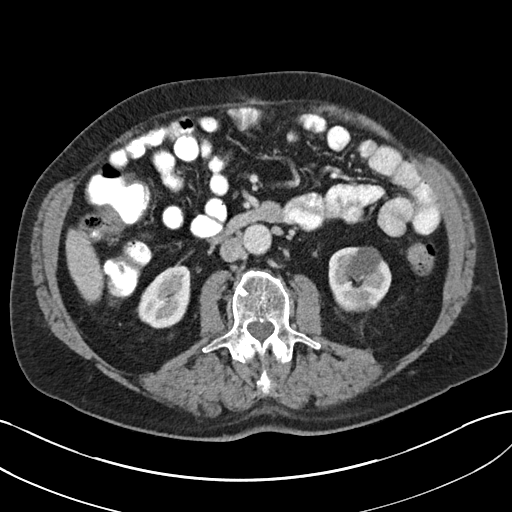
[im 65/100  soft-tissue]
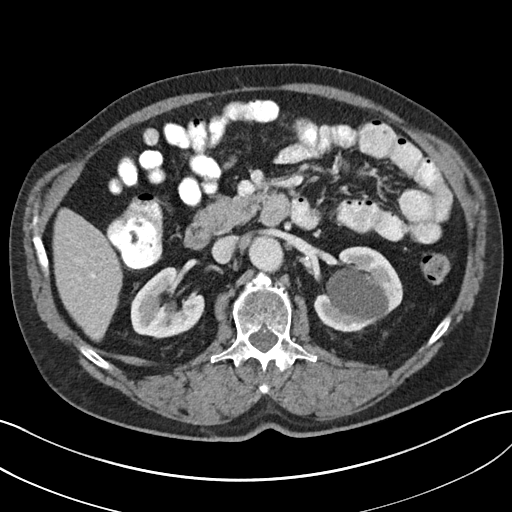
[im 65/100  bone]
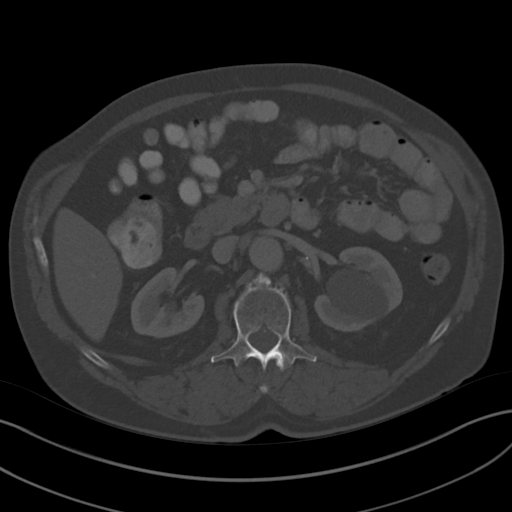
[im 70/100  soft-tissue]
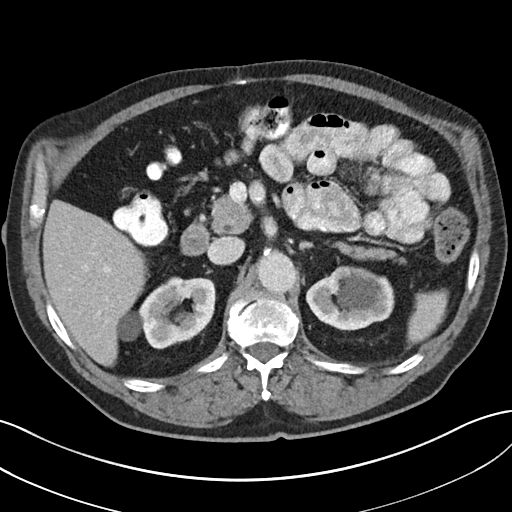
[im 76/100  soft-tissue]
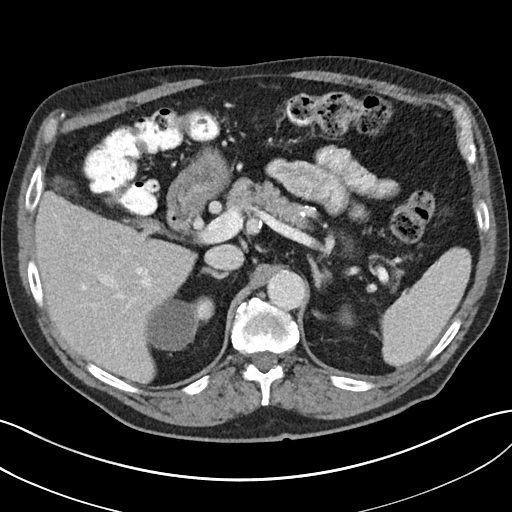
[im 88/100  soft-tissue]
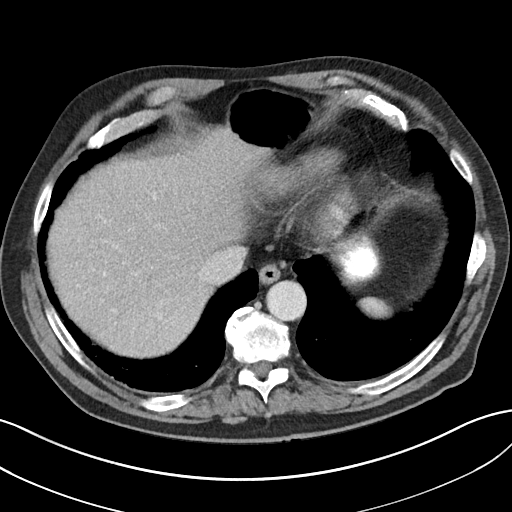
[im 94/100  soft-tissue]
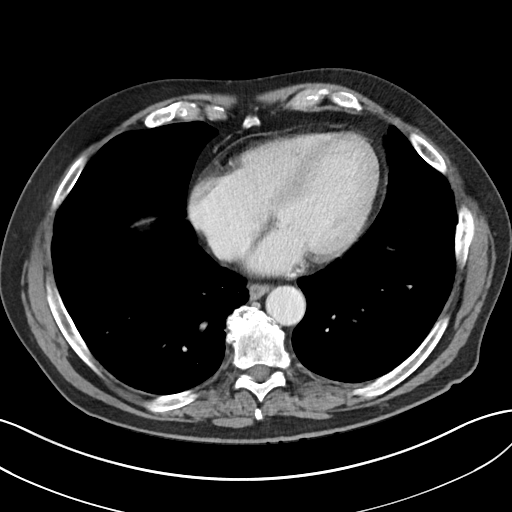

[Series 4: abd pelvis · coronal · 0.76mm/px · 3 of 147 slices shown (2 of 2)]
[im 49/147  soft-tissue]
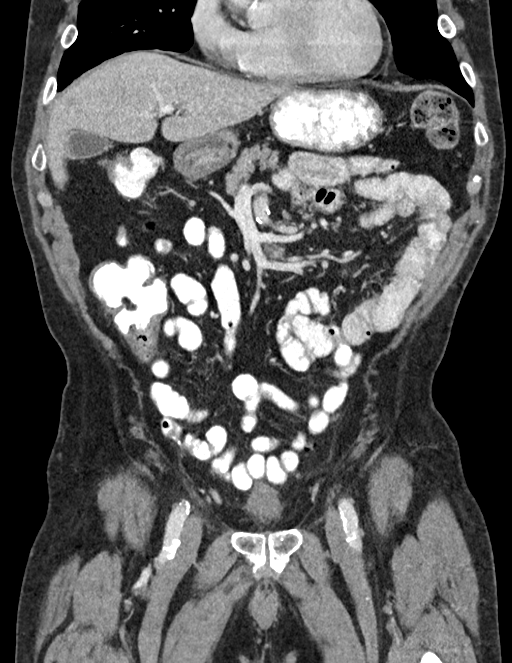
[im 65/147  soft-tissue]
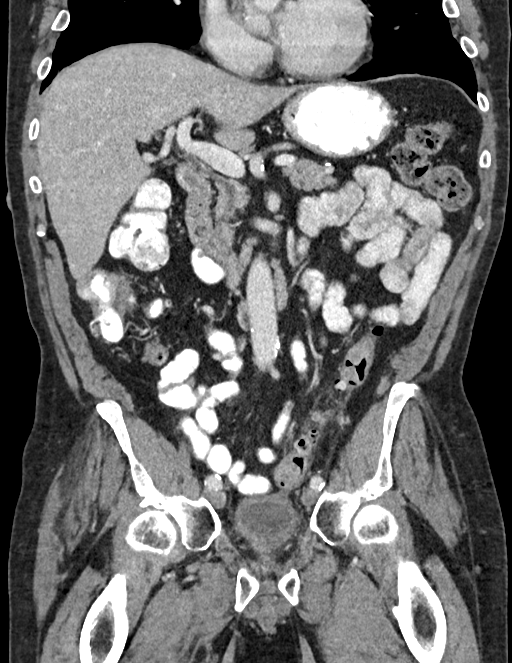
[im 82/147  soft-tissue]
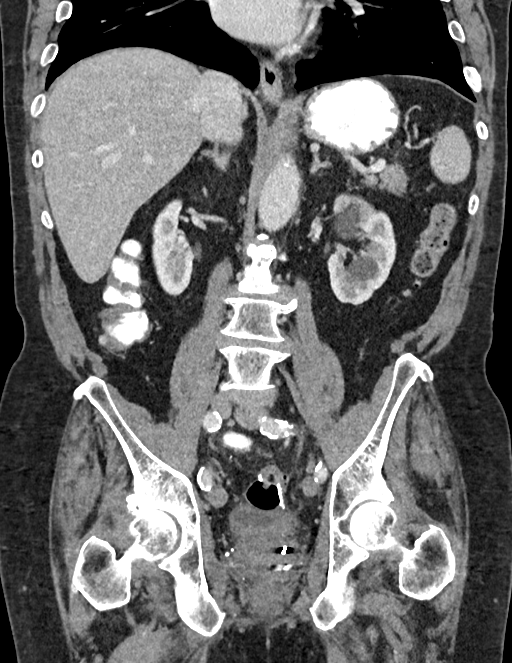

[16 of 46 positions shown; findings below may reference images not displayed]

FINDINGS: Lower chest: No acute abnormality.

Hepatobiliary: No focal liver abnormality is seen. No gallstones,
gallbladder wall thickening, or biliary dilatation.

Pancreas: Unremarkable. No pancreatic ductal dilatation or
surrounding inflammatory changes.

Spleen: Normal in size without focal abnormality.

Adrenals/Urinary Tract: Adrenal glands are unremarkable. Kidneys are
normal, without renal calculi, focal lesion, or hydronephrosis. Mild
bladder wall thickening may be related to underdistention.

Stomach/Bowel: Stomach is within normal limits. Appendix appears
normal. Prior partial colectomy. Interval right lower quadrant
ostomy reversal. Mild colonic diverticulosis with very mild
stranding surrounding a diverticulum in the distal descending colon
(series 6, image 121). No evidence of bowel wall thickening or
distention.

Vascular/Lymphatic: Aortic atherosclerosis. No enlarged abdominal or
pelvic lymph nodes.

Reproductive: Unchanged brachytherapy seeds within the prostate.

Other: No abdominal wall hernia or abnormality. No abdominopelvic
ascites. No pneumoperitoneum.

Musculoskeletal: No acute or significant osseous findings.
IMPRESSION: 1. Very mild inflammatory stranding surrounding a distal descending
colon diverticulum, consistent with acute diverticulitis. No
perforation or abscess.
2. Postsurgical changes related to prior partial colectomy and
interval right lower quadrant ostomy reversal without evidence of
postoperative complication.
3.  Aortic atherosclerosis ([0Z]-[0Z]).

Insert call report

## 2017-09-05 MED ORDER — CIPROFLOXACIN HCL 500 MG PO TABS: 500.0000 mg | ORAL_TABLET | Freq: Two times a day (BID) | ORAL | 0 refills | Status: DC

## 2017-09-05 MED ORDER — IOPAMIDOL (ISOVUE-300) INJECTION 61%
100.0000 mL | Freq: Once | INTRAVENOUS | Status: AC | PRN
  Administered 2017-09-05: 100 mL via INTRAVENOUS

## 2017-09-05 MED ORDER — METRONIDAZOLE 500 MG PO TABS: 500.0000 mg | ORAL_TABLET | Freq: Three times a day (TID) | ORAL | 0 refills | Status: DC

## 2017-09-05 NOTE — Telephone Encounter (Signed)
Received CT Results for patient as follows: Very mild inflammatory stranding surrounding distal descending colon diverticulum consistent with acute diverticulitis. No perforation or abscess.  Thanks [employer]

## 2017-09-05 NOTE — Addendum Note (Signed)
Addended by: Vonda Antigua on: 09/05/2017 04:33 PM   Modules accepted: Orders

## 2017-09-06 ENCOUNTER — Telehealth: Payer: Self-pay | Admitting: Gastroenterology

## 2017-09-06 ENCOUNTER — Other Ambulatory Visit: Payer: Self-pay

## 2017-09-06 DIAGNOSIS — K5792 Diverticulitis of intestine, part unspecified, without perforation or abscess without bleeding: Secondary | ICD-10-CM

## 2017-09-06 MED ORDER — CIPROFLOXACIN HCL 500 MG PO TABS: 500.0000 mg | ORAL_TABLET | Freq: Two times a day (BID) | ORAL | 0 refills | Status: AC

## 2017-09-06 MED ORDER — METRONIDAZOLE 500 MG PO TABS: 500.0000 mg | ORAL_TABLET | Freq: Three times a day (TID) | ORAL | 0 refills | Status: AC

## 2017-09-06 NOTE — Telephone Encounter (Signed)
Pt is returning a call for Dr. Bonna Gains  cb [phone]

## 2017-09-07 DIAGNOSIS — S6992XA Unspecified injury of left wrist, hand and finger(s), initial encounter: Secondary | ICD-10-CM | POA: Diagnosis not present

## 2017-09-11 ENCOUNTER — Encounter: Payer: Self-pay | Admitting: Gastroenterology

## 2017-09-27 DIAGNOSIS — M79642 Pain in left hand: Secondary | ICD-10-CM | POA: Diagnosis not present

## 2017-10-17 ENCOUNTER — Ambulatory Visit: Payer: Medicare Other | Admitting: Family Medicine

## 2017-10-18 ENCOUNTER — Ambulatory Visit: Payer: Medicare Other | Admitting: Family Medicine

## 2017-10-29 ENCOUNTER — Other Ambulatory Visit: Payer: Self-pay | Admitting: Family Medicine

## 2017-12-05 ENCOUNTER — Ambulatory Visit: Payer: Medicare Other | Admitting: Gastroenterology

## 2017-12-27 ENCOUNTER — Other Ambulatory Visit: Payer: Self-pay | Admitting: Family Medicine

## 2018-01-01 ENCOUNTER — Other Ambulatory Visit: Payer: Self-pay | Admitting: Family Medicine

## 2018-01-02 NOTE — Telephone Encounter (Signed)
Please confirm what he is using this for.  Please also see if it has been beneficial.  Thanks.

## 2018-01-02 NOTE — Telephone Encounter (Signed)
Last OV 04/19/2017   Last refilled 08/31/2017 disp 28.35 MG with no refills   Sent to PCP please advise

## 2018-01-03 NOTE — Telephone Encounter (Signed)
Pt is using the cream to treat groin area itching. Pt stated that he had prostate cancer and after the radiation he always seems to sweat profusely around the groin area and suffer with itching and the cream seems to help.   Sent to PCP

## 2018-01-04 NOTE — Telephone Encounter (Signed)
Noted.  Refill sent to pharmacy.  Patient needs follow-up to determine if this is appropriate management.  Please offer him a same-day appointment for a Tuesday afternoon that I am in the office.  Thanks.

## 2018-01-25 DIAGNOSIS — L570 Actinic keratosis: Secondary | ICD-10-CM | POA: Diagnosis not present

## 2018-01-25 DIAGNOSIS — L821 Other seborrheic keratosis: Secondary | ICD-10-CM | POA: Diagnosis not present

## 2018-01-25 DIAGNOSIS — L853 Xerosis cutis: Secondary | ICD-10-CM | POA: Diagnosis not present

## 2018-01-25 DIAGNOSIS — L578 Other skin changes due to chronic exposure to nonionizing radiation: Secondary | ICD-10-CM | POA: Diagnosis not present

## 2018-01-25 DIAGNOSIS — Z85828 Personal history of other malignant neoplasm of skin: Secondary | ICD-10-CM | POA: Diagnosis not present

## 2018-02-01 ENCOUNTER — Ambulatory Visit (INDEPENDENT_AMBULATORY_CARE_PROVIDER_SITE_OTHER): Payer: Medicare Other | Admitting: *Deleted

## 2018-02-01 DIAGNOSIS — Z23 Encounter for immunization: Secondary | ICD-10-CM | POA: Diagnosis not present

## 2018-02-04 ENCOUNTER — Other Ambulatory Visit: Payer: Self-pay | Admitting: Family Medicine

## 2018-02-09 ENCOUNTER — Other Ambulatory Visit: Payer: Self-pay | Admitting: Family Medicine

## 2018-02-15 ENCOUNTER — Other Ambulatory Visit: Payer: Self-pay | Admitting: Family Medicine

## 2018-02-21 ENCOUNTER — Ambulatory Visit (INDEPENDENT_AMBULATORY_CARE_PROVIDER_SITE_OTHER): Payer: Medicare Other

## 2018-02-21 ENCOUNTER — Encounter: Payer: Self-pay | Admitting: Family Medicine

## 2018-02-21 ENCOUNTER — Ambulatory Visit (INDEPENDENT_AMBULATORY_CARE_PROVIDER_SITE_OTHER): Payer: Medicare Other | Admitting: Family Medicine

## 2018-02-21 VITALS — BP 128/76 | HR 81 | Temp 98.5°F | Ht 69.0 in | Wt 216.0 lb

## 2018-02-21 DIAGNOSIS — M25552 Pain in left hip: Secondary | ICD-10-CM

## 2018-02-21 DIAGNOSIS — R0600 Dyspnea, unspecified: Secondary | ICD-10-CM

## 2018-02-21 DIAGNOSIS — M25551 Pain in right hip: Secondary | ICD-10-CM

## 2018-02-21 DIAGNOSIS — M545 Low back pain, unspecified: Secondary | ICD-10-CM

## 2018-02-21 DIAGNOSIS — M4807 Spinal stenosis, lumbosacral region: Secondary | ICD-10-CM | POA: Diagnosis not present

## 2018-02-21 DIAGNOSIS — G8929 Other chronic pain: Secondary | ICD-10-CM

## 2018-02-21 DIAGNOSIS — M5136 Other intervertebral disc degeneration, lumbar region: Secondary | ICD-10-CM | POA: Diagnosis not present

## 2018-02-21 DIAGNOSIS — R0982 Postnasal drip: Secondary | ICD-10-CM

## 2018-02-21 DIAGNOSIS — M1612 Unilateral primary osteoarthritis, left hip: Secondary | ICD-10-CM | POA: Diagnosis not present

## 2018-02-21 DIAGNOSIS — M1611 Unilateral primary osteoarthritis, right hip: Secondary | ICD-10-CM | POA: Diagnosis not present

## 2018-02-21 LAB — COMPREHENSIVE METABOLIC PANEL
ALT: 8 U/L (ref 0–53)
AST: 17 U/L (ref 0–37)
Albumin: 4 g/dL (ref 3.5–5.2)
Alkaline Phosphatase: 74 U/L (ref 39–117)
BILIRUBIN TOTAL: 0.5 mg/dL (ref 0.2–1.2)
BUN: 13 mg/dL (ref 6–23)
CHLORIDE: 104 meq/L (ref 96–112)
CO2: 27 meq/L (ref 19–32)
CREATININE: 1.28 mg/dL (ref 0.40–1.50)
Calcium: 9.3 mg/dL (ref 8.4–10.5)
GFR: 57.85 mL/min — ABNORMAL LOW (ref >60.00)
Glucose, Bld: 92 mg/dL (ref 70–99)
Potassium: 3.9 mEq/L (ref 3.5–5.1)
SODIUM: 137 meq/L (ref 135–145)
Total Protein: 7.4 g/dL (ref 6.0–8.3)

## 2018-02-21 LAB — CBC WITH DIFFERENTIAL/PLATELET
BASOS ABS: 0.1 10*3/uL (ref 0.0–0.1)
BASOS PCT: 1.7 % (ref 0.0–3.0)
EOS ABS: 0.2 10*3/uL (ref 0.0–0.7)
Eosinophils Relative: 3.4 % (ref 0.0–5.0)
HEMATOCRIT: 36.8 % — AB (ref 39.0–52.0)
HEMOGLOBIN: 12.4 g/dL — AB (ref 13.0–17.0)
LYMPHS PCT: 18.9 % (ref 12.0–46.0)
Lymphs Abs: 1.3 10*3/uL (ref 0.7–4.0)
MCHC: 33.6 g/dL (ref 30.0–36.0)
MCV: 85.3 fl (ref 78.0–100.0)
MONO ABS: 0.7 10*3/uL (ref 0.1–1.0)
Monocytes Relative: 10 % (ref 3.0–12.0)
NEUTROS ABS: 4.5 10*3/uL (ref 1.4–7.7)
Neutrophils Relative %: 66 % (ref 43.0–77.0)
PLATELETS: 251 10*3/uL (ref 150.0–400.0)
RBC: 4.31 Mil/uL (ref 4.22–5.81)
RDW: 14.7 % (ref 11.5–15.5)
WBC: 6.9 10*3/uL (ref 4.0–10.5)

## 2018-02-21 IMAGING — DX DG LUMBAR SPINE COMPLETE 4+V
5 series · 5 of 5 positions shown · non-contrast
Comparison: CT scan dated [DATE]

CLINICAL DATA: Low back pain.

EXAM:
LUMBAR SPINE - COMPLETE 4+ VIEW

[lumbar spine ap]
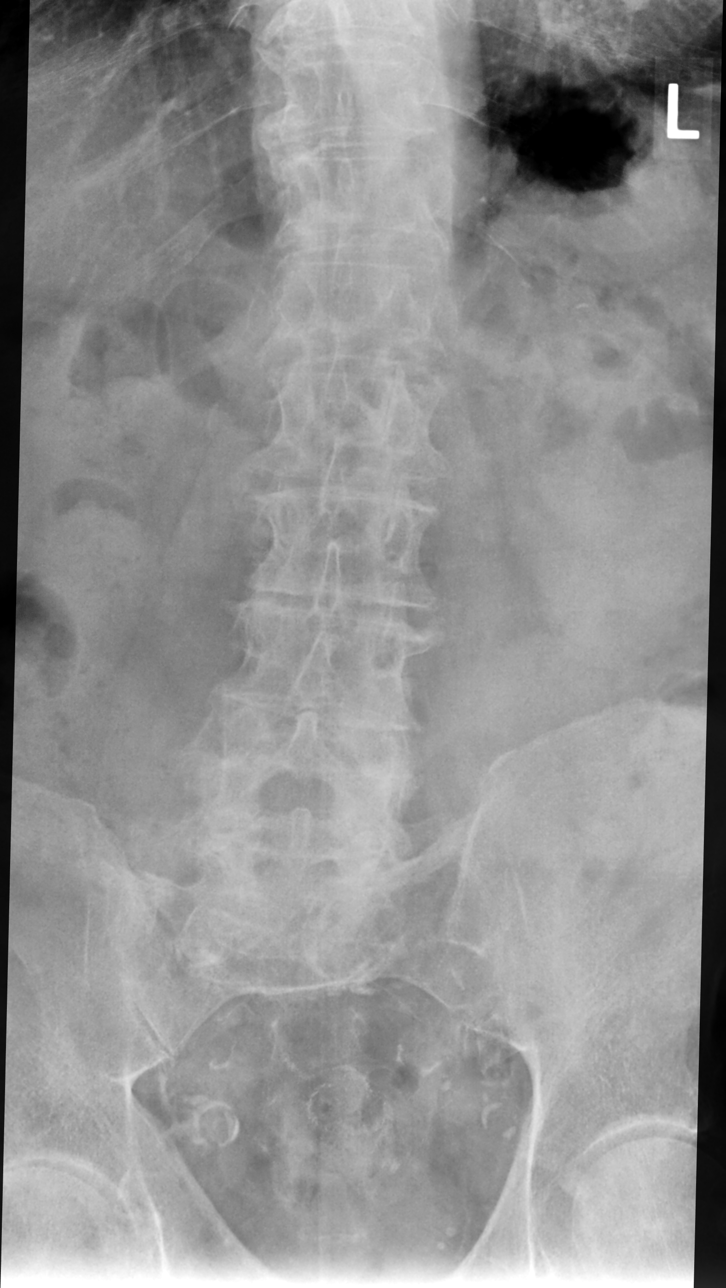

[lumbar spine obl (oblique) (1 of 2)]
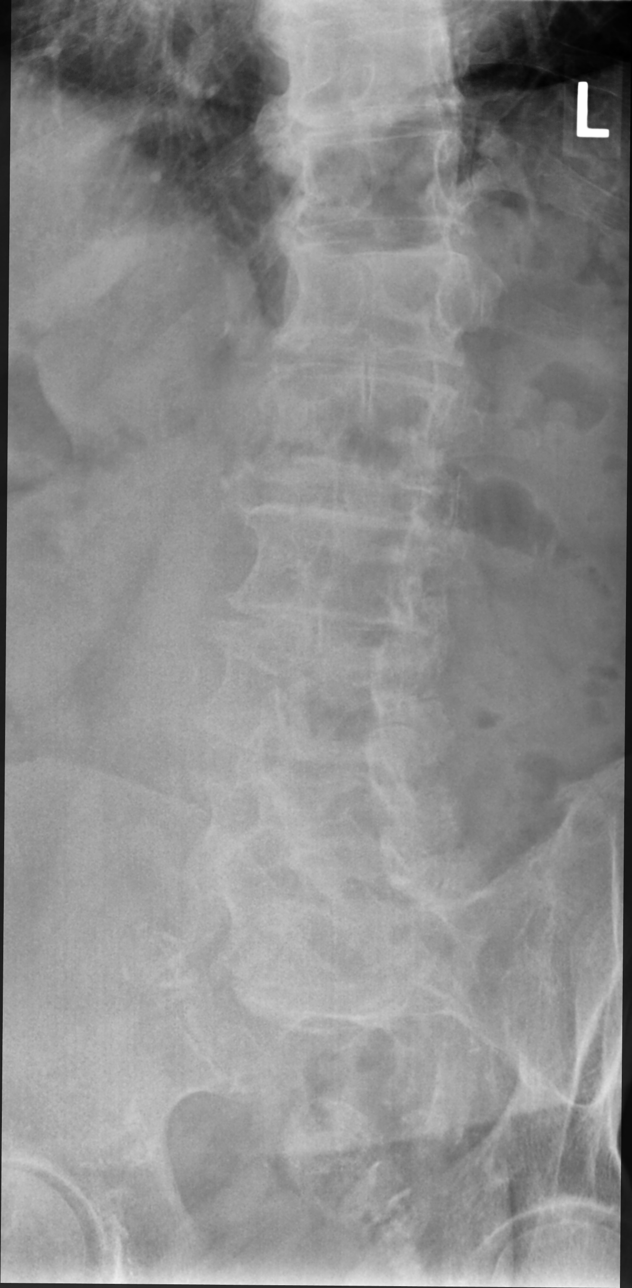

[lumbar spine obl (oblique) (2 of 2)]
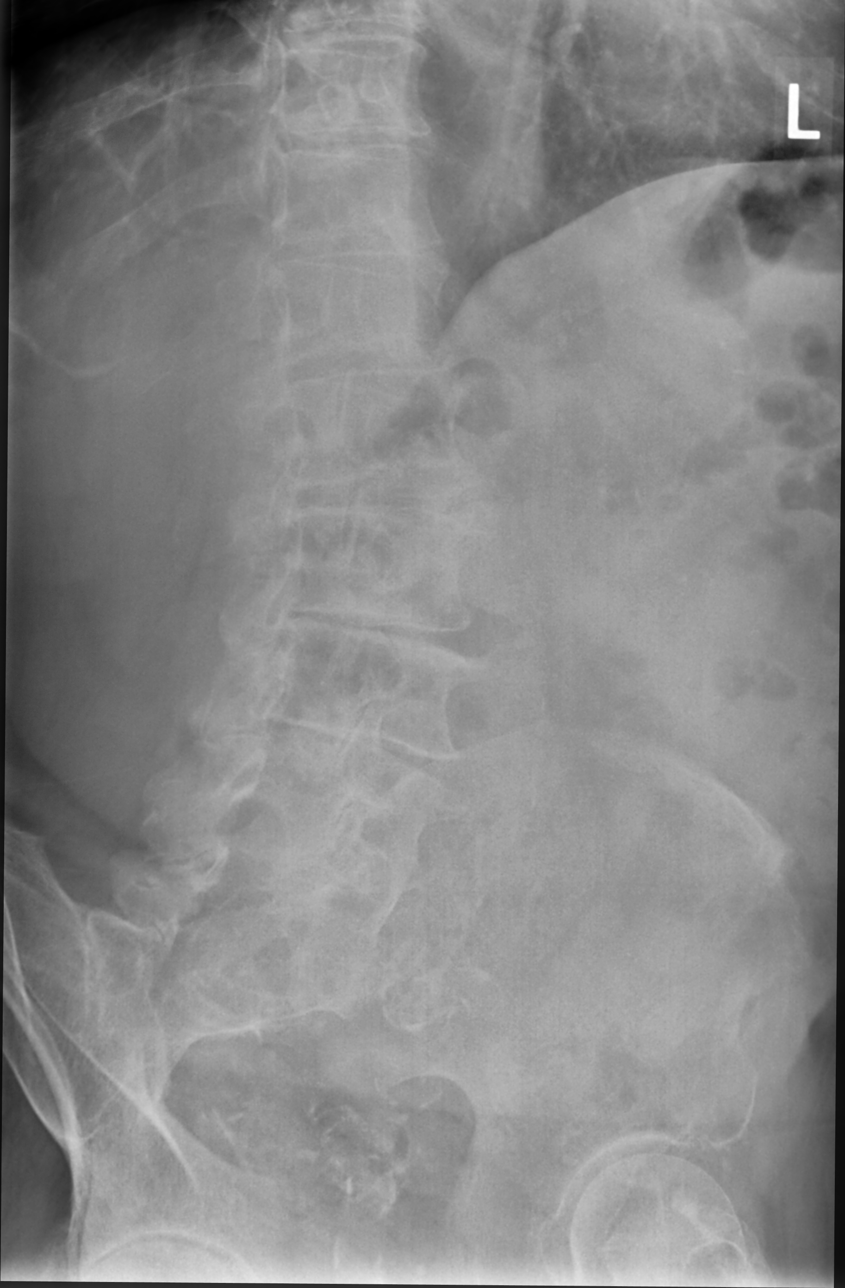

[lumbar spine lat]
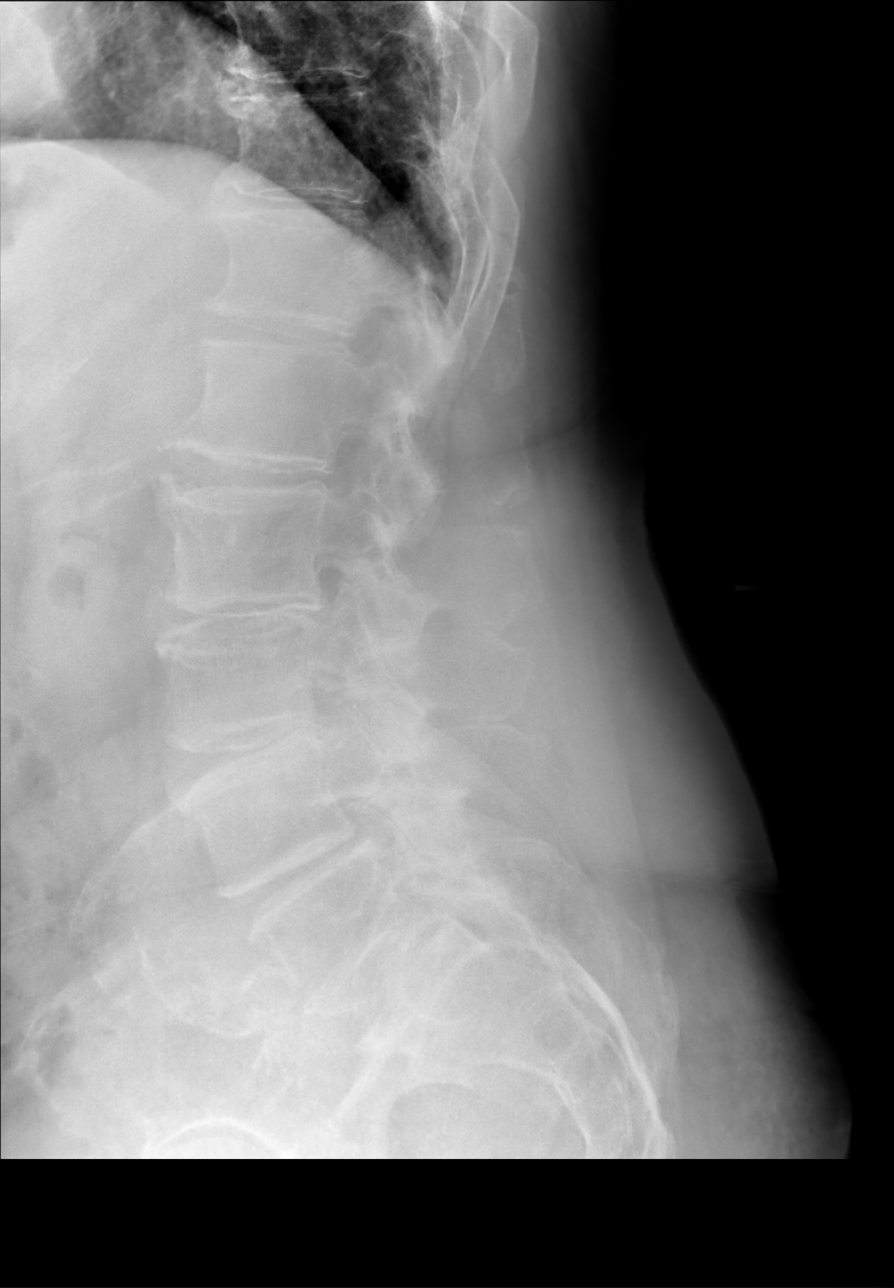

[lumbar spot lat]
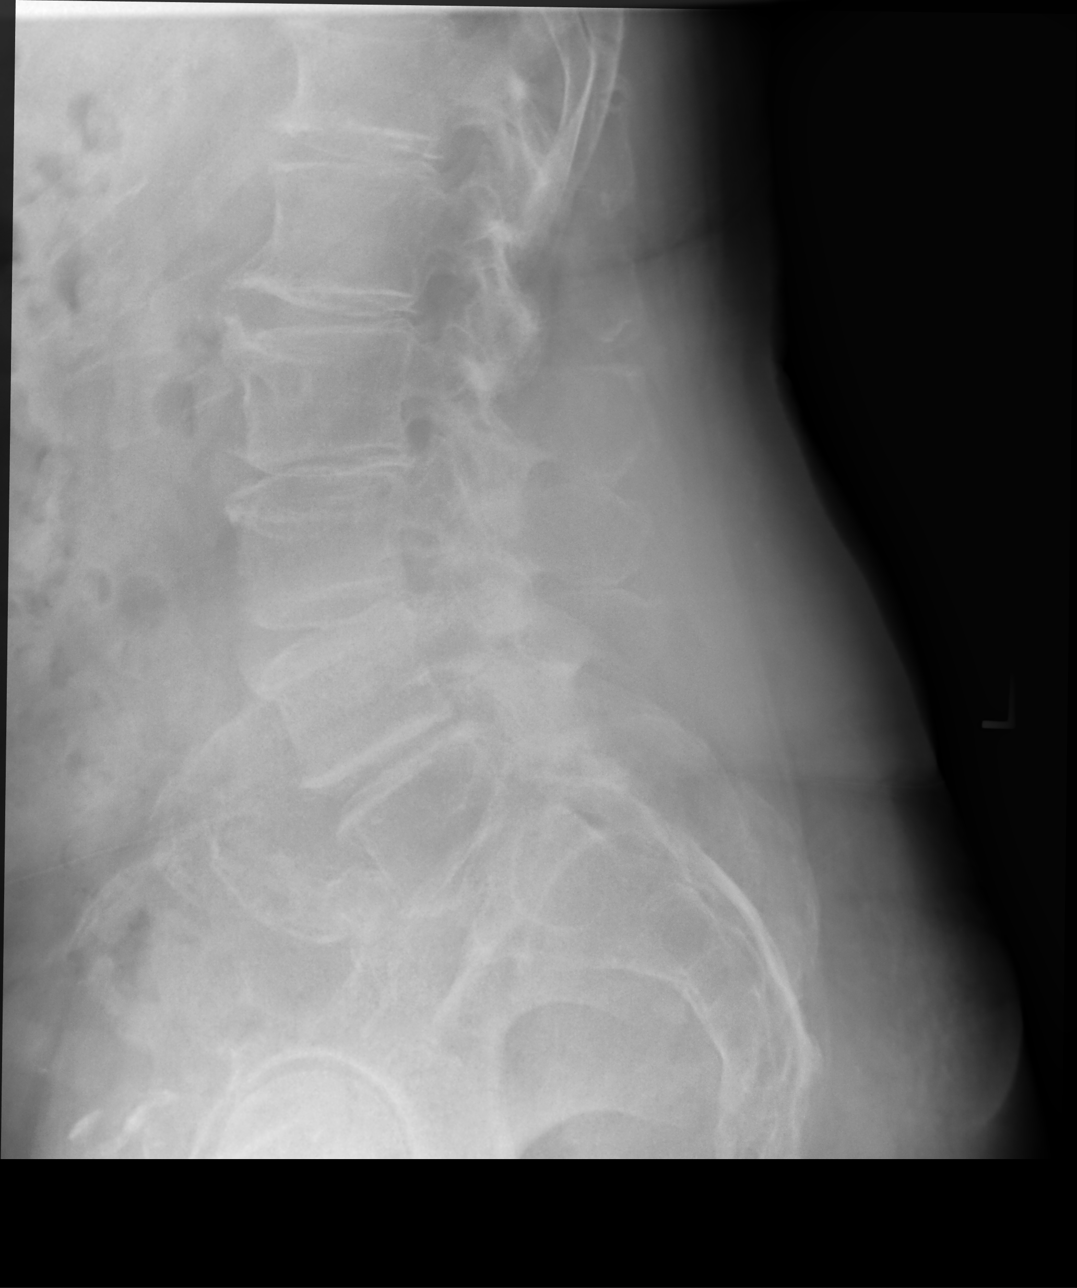

[5 of 5 positions shown; findings below may reference images not displayed]

FINDINGS: There is disc space narrowing from L2-3 through foot L5-S1.
Alignment is normal. Moderately severe facet arthritis at L4-5 and
L5-S1 and to a lesser degree at L3-4.

No fractures or bone destruction.

Aortic atherosclerosis.
IMPRESSION: Multilevel degenerative disc and joint disease in the lumbar spine.
No acute abnormality.

Aortic Atherosclerosis ([9O]-[9O]).

## 2018-02-21 IMAGING — DX DG HIP (WITH OR WITHOUT PELVIS) 2-3V*L*
3 series · 3 of 3 positions shown · non-contrast
Comparison: CT scan of the abdomen and pelvis dated [DATE]

CLINICAL DATA: Left hip pain.

EXAM:
DG HIP (WITH OR WITHOUT PELVIS) 2-3V LEFT

[pelvis ap]
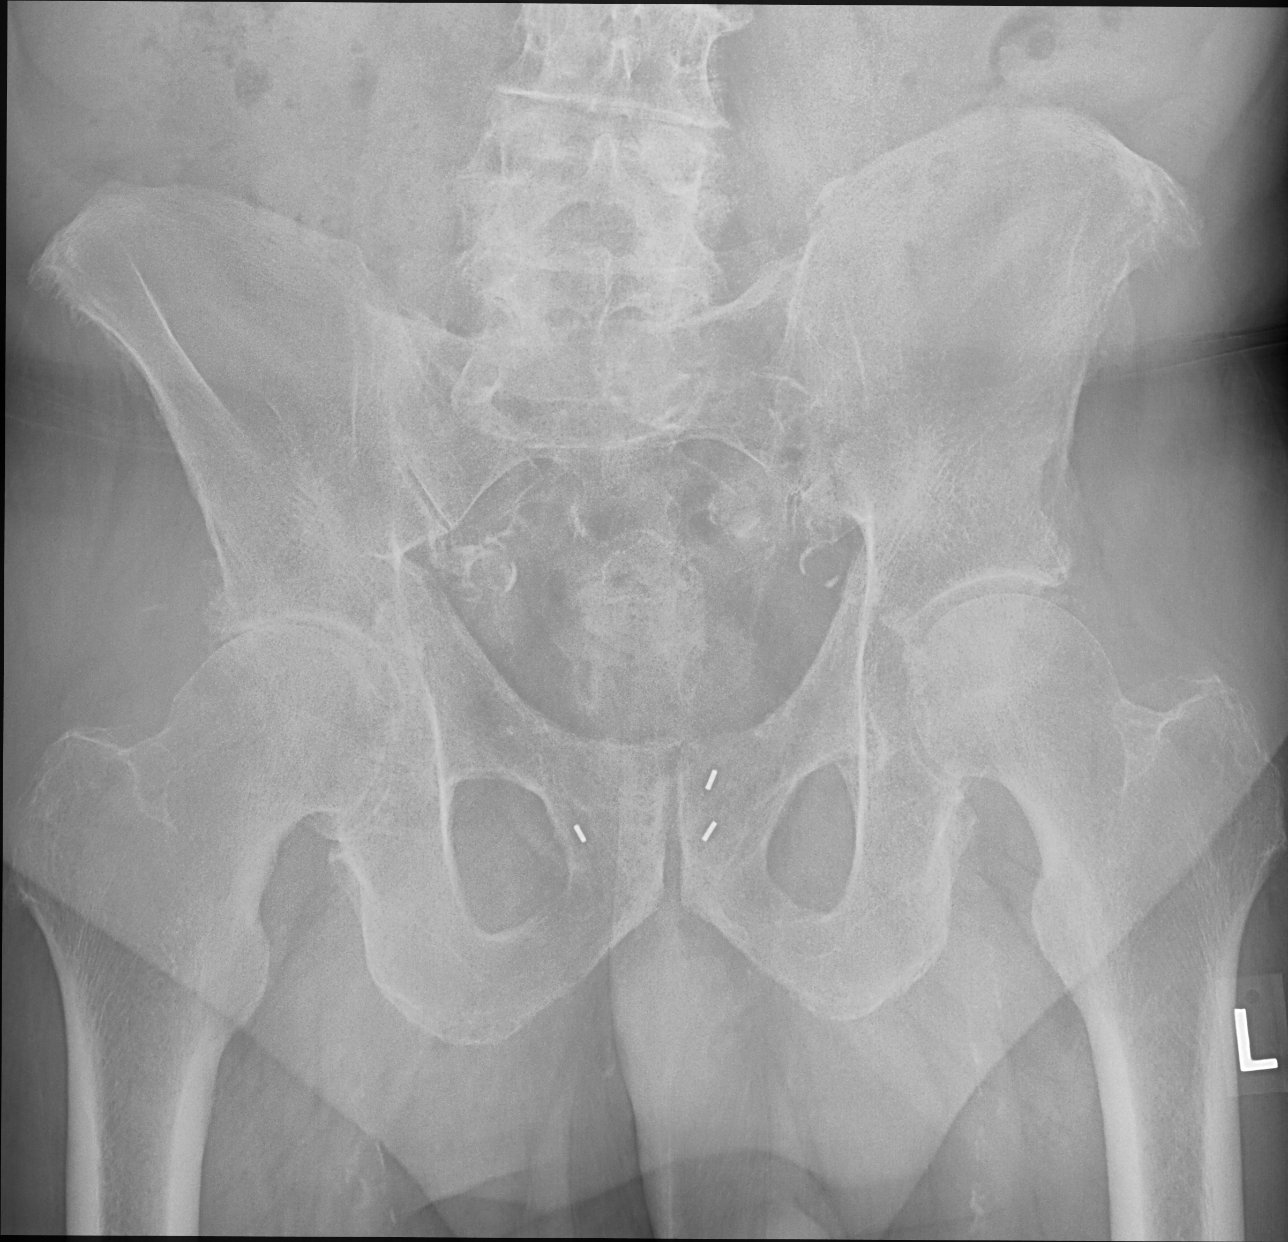

[hip joint ap]
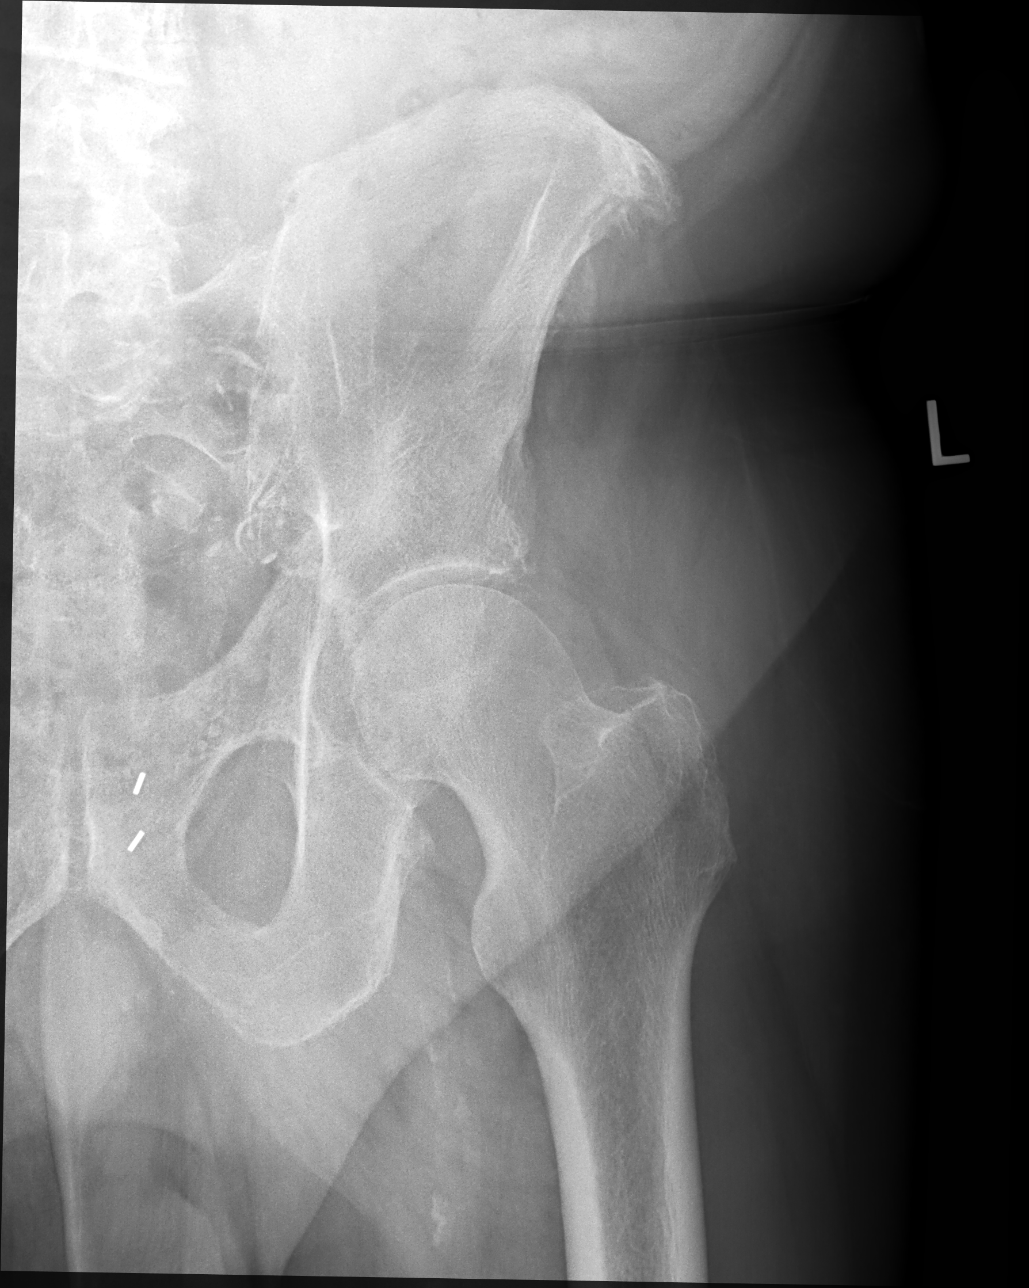

[ap frog obl (oblique)]
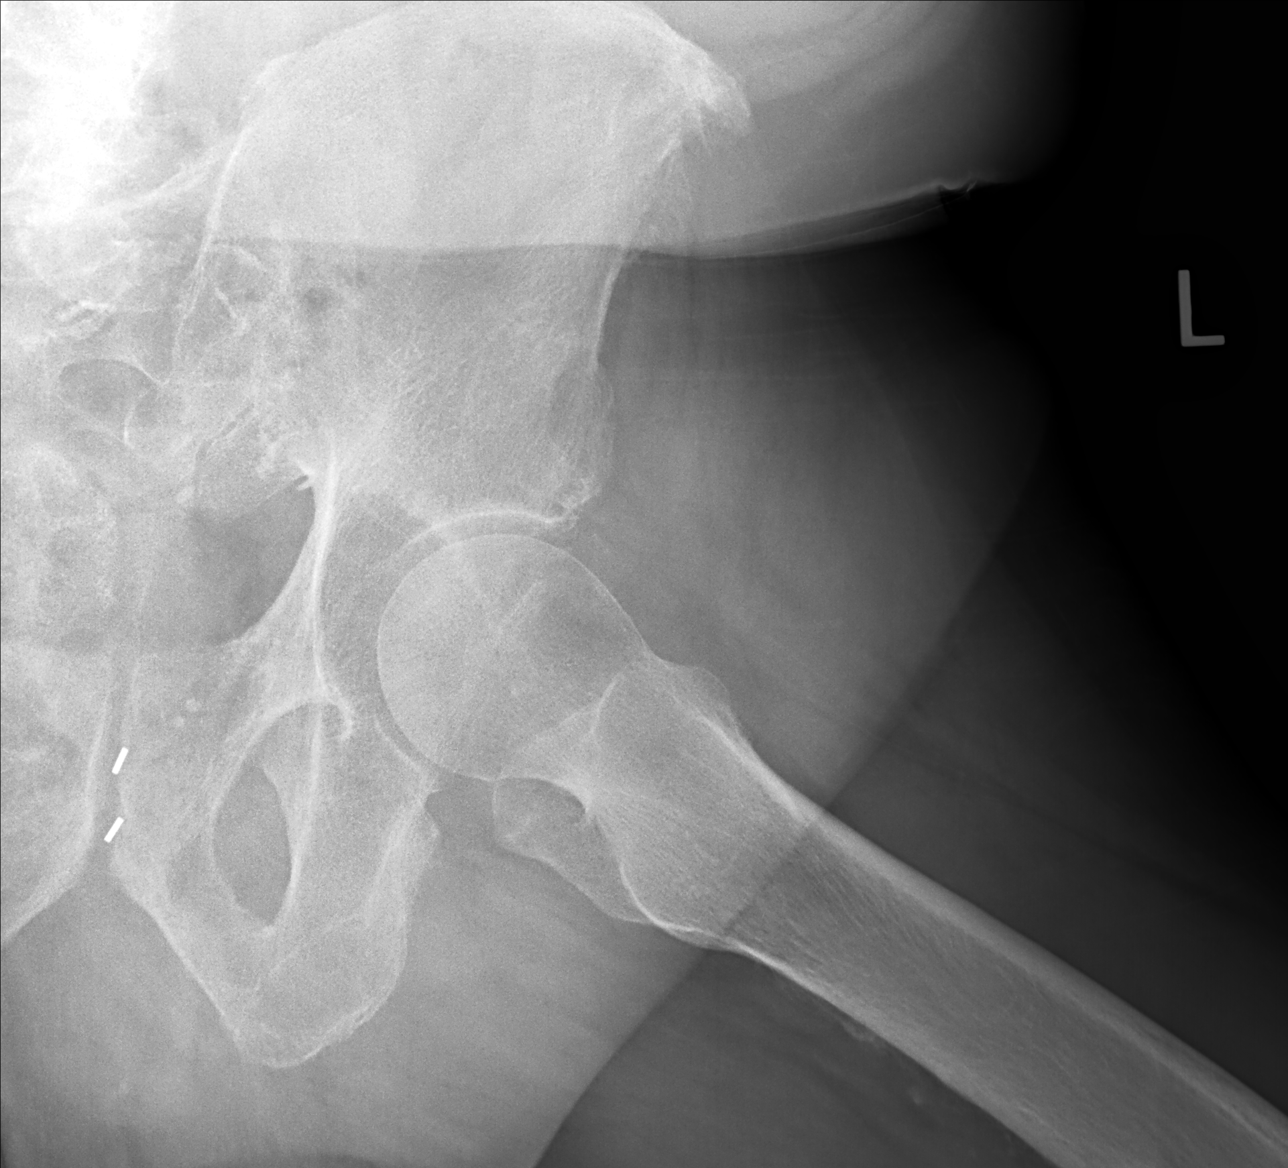

[3 of 3 positions shown; findings below may reference images not displayed]

FINDINGS: There are slight degenerative changes at the superolateral aspect of
the left acetabulum. Joint space is preserved. Femoral head appears
normal. Vascular calcifications around the hip.
IMPRESSION: Minimal degenerative changes of the superolateral aspect of the left
acetabulum.

## 2018-02-21 IMAGING — DX DG HIP (WITH OR WITHOUT PELVIS) 2-3V*R*
2 series · 2 of 2 positions shown · non-contrast
Comparison: Pelvic radiograph dated [DATE]

CLINICAL DATA: Right hip pain.

EXAM:
DG HIP (WITH OR WITHOUT PELVIS) 2-3V RIGHT

[hip joint ap]
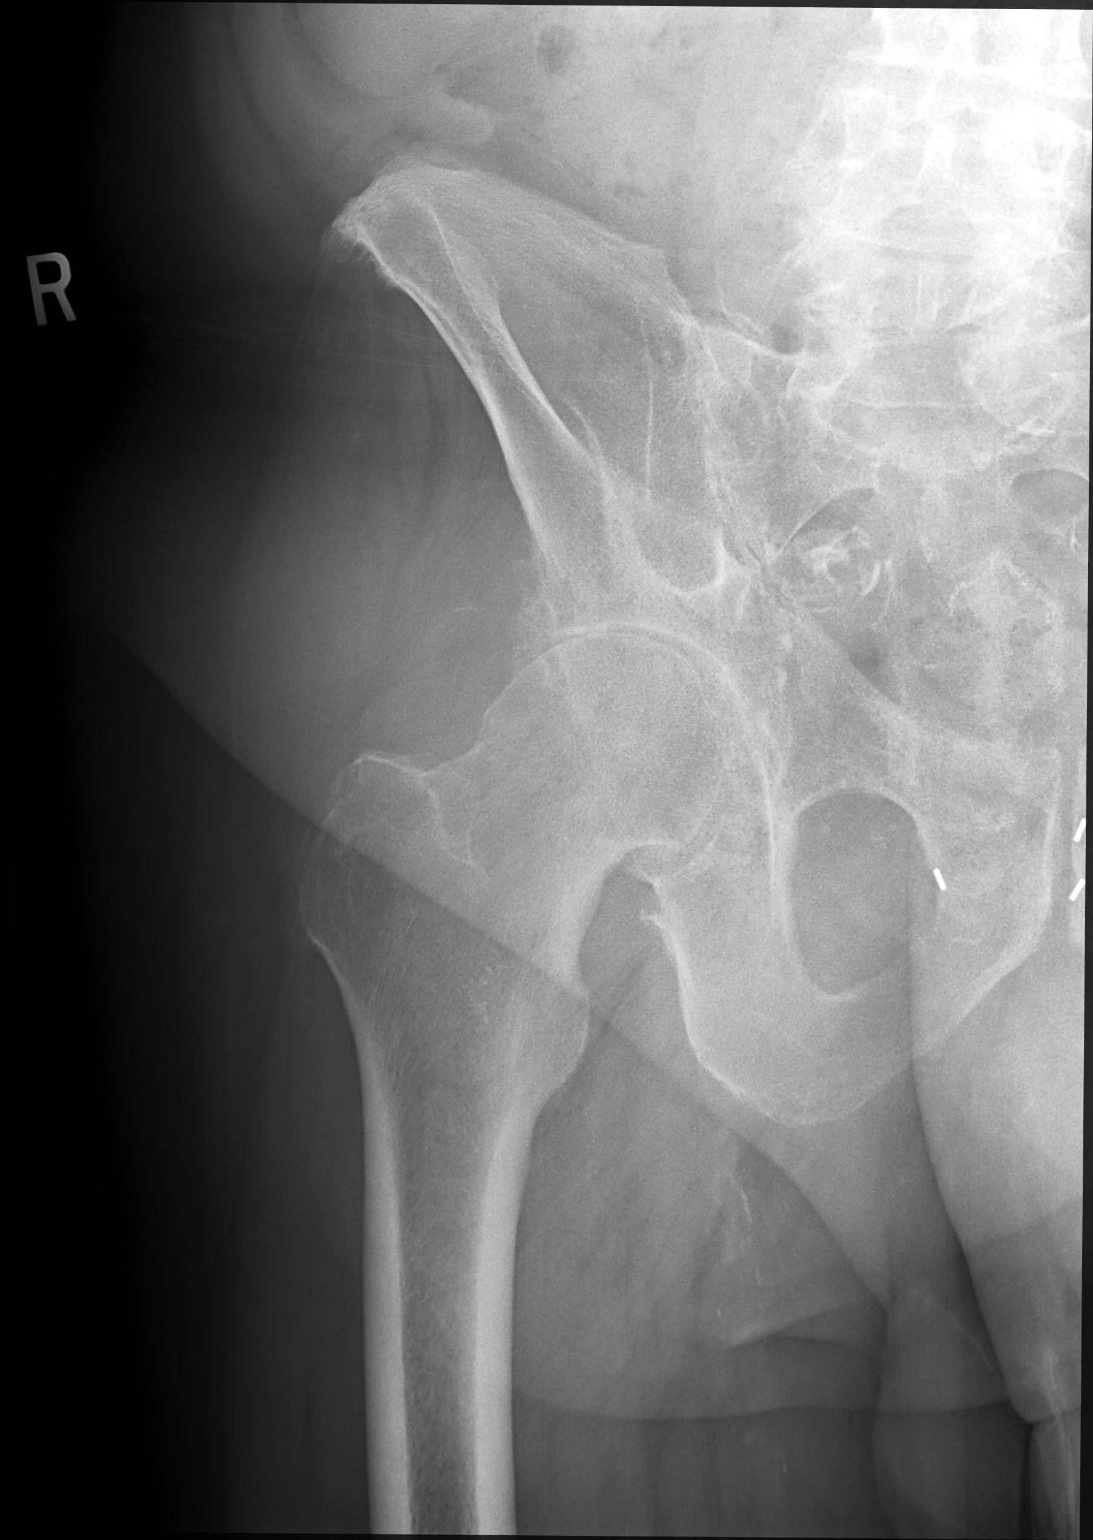

[ap frog obl (oblique)]
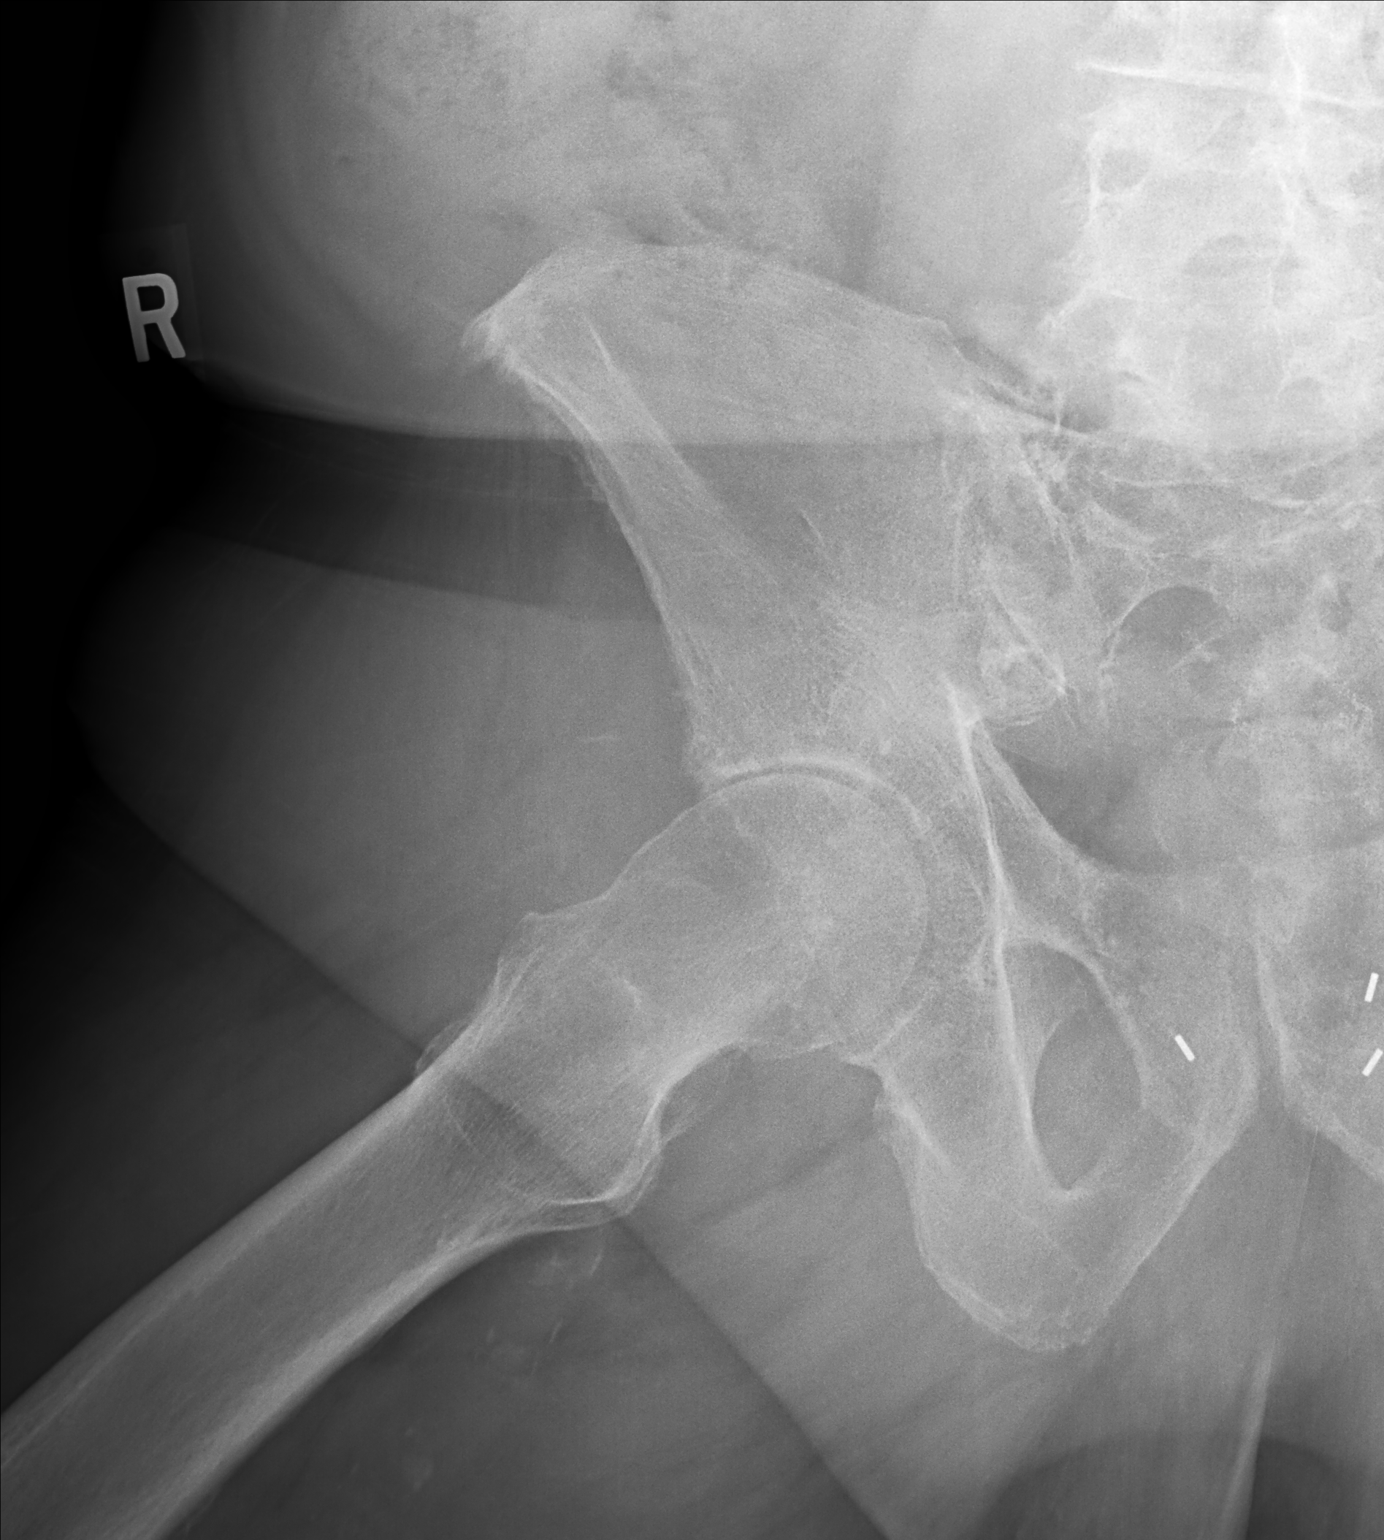

[2 of 2 positions shown; findings below may reference images not displayed]

FINDINGS: There is marked superior joint space narrowing with degenerative
changes of the superolateral aspect of the right acetabulum. These
findings have progressed since [DATE]. There is no fracture or
dislocation. Arterial vascular calcifications are present around the
right hip.
IMPRESSION: Progressive arthritic changes of the right hip as described.

## 2018-02-21 MED ORDER — LORATADINE 10 MG PO TABS
10.0000 mg | ORAL_TABLET | Freq: Every day | ORAL | 1 refills | Status: DC
Start: 2018-02-21 — End: 2018-03-28

## 2018-02-21 NOTE — Progress Notes (Signed)
Subjective:    Patient ID: Cameron Foster., male    DOB: 1940-12-20, 77 y.o.   MRN: [id number]  HPI  Patient presents to clinic due with multiple complaints.  Patient first complains of low back pain, bilateral hip pain, right side more than left.  Patient states when walking a lot to stop and rest due to pain in the back and hips.  States he is unable to cross right leg over due to pain in right hip.  Patient also notes he injured himself as a teenager, right hip pop out of place.  Patient feels that the right hip never fully healed.  Low back pain and hip pain have been present for years.  Patient also complains of sinus congestion and postnasal drip.  States he is always clearing throat and feels pressure in his sinuses.  He currently does not take any over-the-counter allergy medicines or nasal sprays.  Patient states sinus congestion, clearing throat has been an issue has been dealing with for years.  Patient also complains of feeling like he cannot get a good enough breath in sometimes.  Feels his breathing is more shallow.  Denies coughing or wheezing.  Denies being unable to catch his breath.  Describes it more as he has to force himself to taking a good deep breath.  Patient was a smoker from the late 54s to 40.  Patient does not currently have any inhalers that he uses.  Patient Active Problem List   Diagnosis Date Noted  . Penile pain 04/19/2017  . History of colostomy reversal 03/21/2017  . Status post partial colectomy 01/03/2017  . Depression, major, single episode, mild ([place]) 01/03/2017  . Diverticulitis 12/05/2016  . History of Moh's micrographic surgery for skin cancer 09/07/2016  . Vasovagal syncope 01/27/2016  . Actinic keratoses 10/28/2015  . Osteoarthritis of right knee 10/01/2015  . Essential hypertension 08/25/2015  . Chronic abdominal pain 08/25/2015  . Anxiety 08/25/2015  . Exertional dyspnea 08/12/2015  . Gross hematuria 08/04/2015  . Erectile  dysfunction following radiation therapy 08/04/2015  . Urinary retention 08/03/2015  . Myalgia 08/02/2015  . Constipation 08/02/2015  . History of prostate cancer 01/20/2015  . Skin nodule 01/20/2015  . Swelling of right lower extremity 01/20/2015   Social History   Tobacco Use  . Smoking status: Former Smoker    Years: 27.00    Last attempt to quit: 04/18/1988    Years since quitting: 29.8  . Smokeless tobacco: Current User    Types: Chew  Substance Use Topics  . Alcohol use: Yes    Alcohol/week: 4.0 standard drinks    Types: 1 Glasses of wine, 3 Standard drinks or equivalent per week    Comment: Occasional- Former Heavy ETOH Use   Review of Systems  Constitutional: Negative for chills, fatigue and fever.  HENT: +nasal congestion, sinus pain Eyes: Negative.   Respiratory: +sob. Negative for cough, and wheezing.   Cardiovascular: Negative for chest pain, palpitations and leg swelling.  Gastrointestinal: Negative for abdominal pain, diarrhea, nausea and vomiting.  Genitourinary: Negative for dysuria, frequency and urgency.  Musculoskeletal: bilat hip and low back pain.  Skin: Negative for color change, pallor and rash.  Neurological: Negative for syncope, light-headedness and headaches.  Psychiatric/Behavioral: The patient is not nervous/anxious.       Objective:   Physical Exam  Constitutional: He is oriented to person, place, and time. No distress.  HENT:  Head: Normocephalic.  +mucosal edema, post nasal drip.   Eyes:  Conjunctivae and EOM are normal. No scleral icterus.  Neck: Neck supple. No JVD present. No tracheal deviation present.  Cardiovascular: Normal rate and regular rhythm.  Pulmonary/Chest: Effort normal and breath sounds normal. No stridor. No respiratory distress. He has no wheezes. He has no rales.  Musculoskeletal: He exhibits no edema.  Positive tenderness along lumbar spine.  Positive right hip pain.  Minimal pain with abduction and abduction of left  leg at hip joint.  Moderate pain with abduction and abduction of right leg at hip joint.  Patient has to use hands to help push himself up off of chair to go from sitting to standing position.  Lymphadenopathy:    He has no cervical adenopathy.  Neurological: He is alert and oriented to person, place, and time. No cranial nerve deficit.  Skin: Skin is warm and dry. He is not diaphoretic.  Psychiatric: He has a normal mood and affect. His behavior is normal.  Nursing note and vitals reviewed.     Vitals:   02/21/18 1121  BP: 128/76  Pulse: 81  Temp: 98.5 F (36.9 C)  SpO2: 95%    Assessment & Plan:   Chronic pain in both hips, chronic low back pain- we will get new imaging of both lumbar spine and bilateral hips and pelvis.  Advised patient that I feel his pain most likely is related to arthritis and after we get results of x-ray, we discussed possibility of referral to orthopedics.  Patient advised he can use Tylenol as needed to help control pain.  Dyspnea-we will get CBC and CMP and lab work to evaluate for any electrolyte abnormalities, anemias.  Offered to do chest x-ray in clinic to look at lungs, but patient declines chest x-ray.  Offered albuterol inhaler to use as needed for any episodes of shortness of breath, patient declines inhaler prescription.  Postnasal drip- patient will begin Claritin 10 mg once daily to help dry up nasal congestion and postnasal drip.  Also advised to keep up good fluid intake to help keep secretions thin.  Keep regularly plan follow-up with PCP.  Return to clinic sooner if issues arise.  Patient aware that we will contact him with results of x-rays and lab work.

## 2018-02-22 ENCOUNTER — Encounter: Payer: Self-pay | Admitting: Family Medicine

## 2018-02-22 ENCOUNTER — Telehealth: Payer: Self-pay | Admitting: Family Medicine

## 2018-02-22 NOTE — Telephone Encounter (Signed)
Pt called he wants  a new refill on what was given to him yesterday states its too expensive..(Loratadine) I tried to call Pt, no answer so I left a message to call me back.

## 2018-02-22 NOTE — Telephone Encounter (Signed)
Loratadine if it is too expensive as a Rx can be bought over the counter, that is the generic form of claritin. Usually you can get 50-100 pills for less than $10  Another OTC allergy medication option would be cetirizine 10 mg per day, this is generic zyrtec

## 2018-02-22 NOTE — Telephone Encounter (Signed)
pt needs a new refill on what was given to him yesterday states its too expensive.. Also please contact pt about this

## 2018-02-23 ENCOUNTER — Telehealth: Payer: Self-pay | Admitting: Lab

## 2018-02-23 ENCOUNTER — Other Ambulatory Visit: Payer: Self-pay | Admitting: Family Medicine

## 2018-02-23 DIAGNOSIS — G8929 Other chronic pain: Secondary | ICD-10-CM

## 2018-02-23 DIAGNOSIS — M25552 Pain in left hip: Secondary | ICD-10-CM

## 2018-02-23 DIAGNOSIS — M545 Low back pain, unspecified: Secondary | ICD-10-CM

## 2018-02-23 DIAGNOSIS — M25551 Pain in right hip: Secondary | ICD-10-CM

## 2018-02-23 NOTE — Telephone Encounter (Signed)
Its in the patient message in his chart  Ortho referral placed

## 2018-02-23 NOTE — Telephone Encounter (Signed)
Pt called stating he picked up Rx and it cost him $27 that his insurance didn't pay for, and he started taking it yesterday. Pt also stated that he would please like to have a referral sent out for Ortho that you suggested. But I couldn't find in the notes where you suggested Ortho.

## 2018-02-23 NOTE — Telephone Encounter (Signed)
Called Pt no answer, No VM. Okay for P[facility] to talk to Pt. Called to tell the Pt that NP Lauren Guse put in a referral for Pt to Go to Ortho, and someone will be calling him to set up an appt.

## 2018-02-28 ENCOUNTER — Encounter: Payer: Self-pay | Admitting: Family Medicine

## 2018-02-28 DIAGNOSIS — G8929 Other chronic pain: Secondary | ICD-10-CM

## 2018-02-28 DIAGNOSIS — M160 Bilateral primary osteoarthritis of hip: Secondary | ICD-10-CM

## 2018-02-28 DIAGNOSIS — R0600 Dyspnea, unspecified: Secondary | ICD-10-CM

## 2018-02-28 DIAGNOSIS — M545 Low back pain, unspecified: Secondary | ICD-10-CM

## 2018-02-28 DIAGNOSIS — R0609 Other forms of dyspnea: Secondary | ICD-10-CM

## 2018-02-28 DIAGNOSIS — I70203 Unspecified atherosclerosis of native arteries of extremities, bilateral legs: Secondary | ICD-10-CM

## 2018-03-05 ENCOUNTER — Ambulatory Visit (INDEPENDENT_AMBULATORY_CARE_PROVIDER_SITE_OTHER): Payer: Medicare Other | Admitting: Vascular Surgery

## 2018-03-05 ENCOUNTER — Encounter (INDEPENDENT_AMBULATORY_CARE_PROVIDER_SITE_OTHER): Payer: Self-pay | Admitting: Vascular Surgery

## 2018-03-05 ENCOUNTER — Other Ambulatory Visit: Payer: Self-pay

## 2018-03-05 VITALS — BP 142/83 | HR 97 | Resp 17 | Ht 69.0 in | Wt 219.0 lb

## 2018-03-05 DIAGNOSIS — R0600 Dyspnea, unspecified: Secondary | ICD-10-CM

## 2018-03-05 DIAGNOSIS — I10 Essential (primary) hypertension: Secondary | ICD-10-CM

## 2018-03-05 DIAGNOSIS — M79606 Pain in leg, unspecified: Secondary | ICD-10-CM | POA: Insufficient documentation

## 2018-03-05 DIAGNOSIS — R0609 Other forms of dyspnea: Secondary | ICD-10-CM

## 2018-03-05 DIAGNOSIS — M79604 Pain in right leg: Secondary | ICD-10-CM

## 2018-03-05 DIAGNOSIS — Z87891 Personal history of nicotine dependence: Secondary | ICD-10-CM | POA: Diagnosis not present

## 2018-03-05 DIAGNOSIS — M1711 Unilateral primary osteoarthritis, right knee: Secondary | ICD-10-CM | POA: Diagnosis not present

## 2018-03-05 DIAGNOSIS — M79605 Pain in left leg: Secondary | ICD-10-CM

## 2018-03-05 NOTE — Progress Notes (Signed)
MRN : [id number]  Cameron Foster. is a 77 y.o. (23-Nov-1940) male who presents with chief complaint of  Chief Complaint  Patient presents with  . New Patient (Initial Visit)    ref Cameron Foster for bilateral atherosclerosis of leg  .  History of Present Illness:   The patient is seen for evaluation of painful lower extremities. Patient notes the pain is variable and not always associated with activity.  The pain is somewhat consistent day to day occurring on most days. The patient notes the pain also occurs with standing and routinely seems worse as the day wears on. The pain has been progressive over the past several years. The patient states these symptoms are causing  a profound negative impact on quality of life and daily activities.  The patient denies rest pain or dangling of an extremity off the side of the bed during the night for relief. No open wounds or sores at this time. No history of DVT or phlebitis. No prior interventions or surgeries.  There is a  history of back problems and DJD of the lumbar and sacral spine.    Current Meds  Medication Sig  . amLODipine (NORVASC) 5 MG tablet TAKE 1 TABLET BY MOUTH EVERY DAY  . docusate sodium (STOOL SOFTENER) 100 MG capsule Take 100 mg by mouth 2 (two) times daily.  . fluticasone (FLONASE) 50 MCG/ACT nasal spray Place 1 spray into both nostrils daily as needed for allergies or rhinitis.  . hydrocortisone 2.5 % cream APPLY CREAM TO AFFECTED AREA TWICE DAILY AS NEEDED FOR RASH  . lisinopril (PRINIVIL,ZESTRIL) 10 MG tablet TAKE 1 TABLET BY MOUTH EVERY DAY  . liver oil-zinc oxide (DESITIN) 40 % ointment Apply 1 application topically daily as needed for irritation.  Marland Kitchen loratadine (CLARITIN) 10 MG tablet Take 1 tablet (10 mg total) by mouth daily.  . metoprolol succinate (TOPROL-XL) 25 MG 24 hr tablet TAKE 1 TABLET BY MOUTH DAILY  . omeprazole (PRILOSEC) 20 MG capsule Take 20 mg by mouth daily as needed (heartburn).   . sertraline  (ZOLOFT) 100 MG tablet TAKE 1 TABLET BY MOUTH EVERY DAY    Past Medical History:  Diagnosis Date  . Alcoholism ([place])   . Arrhythmia   . Arthritis   . Asthma   . Atrial fibrillation ([place])    one episode  . Colon polyp   . Depression    Son died 07-08-2014  . Diverticulitis   . Diverticulosis 30 years  . Dyspnea   . Dysrhythmia    At Fib 1995  . Fainting   . GERD (gastroesophageal reflux disease)   . History of blood clots July 09, 2015   pt states" clots in my urine"  . Hyperlipidemia   . Hypertension   . Irritable bowel syndrome   . Prostate cancer ([place]) 07-08-10   treated with radiation therapy. Prostate  . Vasovagal syncope   . Vertigo     Past Surgical History:  Procedure Laterality Date  . CARDIAC CATHETERIZATION     [place] no stents  . CATARACT EXTRACTION, BILATERAL    . COLON RESECTION SIGMOID N/A 12/07/2016   Procedure: COLON RESECTION SIGMOID;  Surgeon: Clayburn Pert, MD;  Location: [facility] ORS;  Service: General;  Laterality: N/A;  . COLON SURGERY  11/2016   Colostomy  . COLONOSCOPY  07-08-2014  . COLONOSCOPY WITH PROPOFOL N/A 05/30/2016   Procedure: COLONOSCOPY WITH PROPOFOL;  Surgeon: Lollie Sails, MD;  Location: [facility];  Service: Endoscopy;  Laterality:  N/A;  . COLOSTOMY Left 12/07/2016   Procedure: COLOSTOMY;  Surgeon: Clayburn Pert, MD;  Location: [facility] ORS;  Service: General;  Laterality: Left;  . COLOSTOMY REVERSAL N/A 03/21/2017   Procedure: COLOSTOMY REVERSAL;  Surgeon: Clayburn Pert, MD;  Location: [facility] ORS;  Service: General;  Laterality: N/A;  . COLOSTOMY TAKEDOWN N/A 03/21/2017   Procedure: LAPAROSCOPIC COLOSTOMY TAKEDOWN;  Surgeon: Clayburn Pert, MD;  Location: [facility] ORS;  Service: General;  Laterality: N/A;  . CYSTOSCOPY WITH STENT PLACEMENT Bilateral 03/21/2017   Procedure: CYSTOSCOPY WITH LIGHTED STENT PLACEMENT;  Surgeon: Abbie Sons, MD;  Location: [facility] ORS;  Service: Urology;  Laterality: Bilateral;  . ESOPHAGOGASTRODUODENOSCOPY    .  ESOPHAGOGASTRODUODENOSCOPY N/A 05/30/2016   Procedure: ESOPHAGOGASTRODUODENOSCOPY (EGD);  Surgeon: Lollie Sails, MD;  Location: [facility] ENDOSCOPY;  Service: Endoscopy;  Laterality: N/A;  . EYE SURGERY     cataracts  . FLEXIBLE SIGMOIDOSCOPY N/A 03/21/2017   Procedure: FLEXIBLE SIGMOIDOSCOPY;  Surgeon: Clayburn Pert, MD;  Location: [facility] ORS;  Service: General;  Laterality: N/A;  . FRACTURE SURGERY Bilateral    right arm and left wrist  . INCISION AND DRAINAGE ABSCESS N/A 12/07/2016   Procedure: DRAINAGE  OF INTRA ABDOMINAL ABSCESS;  Surgeon: Clayburn Pert, MD;  Location: [facility] ORS;  Service: General;  Laterality: N/A;  . KNEE ARTHROSCOPY    . LAPAROTOMY N/A 12/07/2016   Procedure: EXPLORATORY LAPAROTOMY;  Surgeon: Clayburn Pert, MD;  Location: [facility] ORS;  Service: General;  Laterality: N/A;  . PROSTATE SURGERY     Microwave therapy  . REPLACEMENT TOTAL KNEE Right 07/2016  . TONSILLECTOMY    . TOTAL KNEE ARTHROPLASTY Right 07/27/2016   Procedure: TOTAL KNEE ARTHROPLASTY;  Surgeon: [name], MD;  Location: [facility] ORS;  Service: Orthopedics;  Laterality: Right;  Dr. Erlene Quan had to place Urinary catheter due to prostate cancer history.  Using flexible scope.    Social History Social History   Tobacco Use  . Smoking status: Former Smoker    Years: 27.00    Last attempt to quit: 04/18/1988    Years since quitting: 29.8  . Smokeless tobacco: Former Systems developer    Types: Chew  Substance Use Topics  . Alcohol use: Yes    Alcohol/week: 4.0 standard drinks    Types: 1 Glasses of wine, 3 Standard drinks or equivalent per week    Comment: Occasional- Former Heavy ETOH Use  . Drug use: No    Family History Family History  Problem Relation Age of Onset  . Lung cancer Father   . Other Mother   . Bipolar disorder Son   . Sudden death Son        due to drug over dose and cardiac issues  . Prostate cancer Neg Hx   . Bladder Cancer Neg Hx   No family history of bleeding/clotting disorders,  porphyria or autoimmune disease   Allergies  Allergen Reactions  . Augmentin [Amoxicillin-Pot Clavulanate] Other (See Comments)    Abdominal upset Has patient had a PCN reaction causing immediate rash, facial/tongue/throat swelling, SOB or lightheadedness with hypotension: Unknown Has patient had a PCN reaction causing severe rash involving mucus membranes or skin necrosis: Unknown Has patient had a PCN reaction that required hospitalization: Unknown Has patient had a PCN reaction occurring within the last 10 years: Unknown If all of the above answers are "NO", then may proceed with Cephalosporin use.   . Formaldehyde Rash  . Tape Rash    Pt states he is not allergic to tape.  REVIEW OF SYSTEMS (Negative unless checked)  Constitutional: [] Weight loss  [] Fever  [] Chills Cardiac: [] Chest pain   [] Chest pressure   [] Palpitations   [] Shortness of breath when laying flat   [x] Shortness of breath with exertion. Vascular:  [x] Pain in legs with walking   [] Pain in legs at rest  [] History of DVT   [] Phlebitis   [] Swelling in legs   [] Varicose veins   [] Non-healing ulcers Pulmonary:   [] Uses home oxygen   [] Productive cough   [] Hemoptysis   [] Wheeze  [] COPD   [] Asthma Neurologic:  [] Dizziness   [] Seizures   [] History of stroke   [] History of TIA  [] Aphasia   [] Vissual changes   [] Weakness or numbness in arm   [] Weakness or numbness in leg Musculoskeletal:   [] Joint swelling   [x] Joint pain   [x] Low back pain Hematologic:  [] Easy bruising  [] Easy bleeding   [] Hypercoagulable state   [] Anemic Gastrointestinal:  [] Diarrhea   [] Vomiting  [] Gastroesophageal reflux/heartburn   [] Difficulty swallowing. Genitourinary:  [] Chronic kidney disease   [] Difficult urination  [] Frequent urination   [] Blood in urine Skin:  [] Rashes   [] Ulcers  Psychological:  [] History of anxiety   []  History of major depression.  Physical Examination  Vitals:   03/05/18 1447  BP: (!) 142/83  Pulse: 97  Resp: 17    Weight: 219 lb (99.3 kg)  Height: 5' 9"  (1.753 m)   Body mass index is 32.34 kg/m. Gen: WD/WN, NAD Head: [place]/AT, No temporalis wasting.  Ear/Nose/Throat: Hearing grossly intact, nares w/o erythema or drainage, poor dentition Eyes: PER, EOMI, sclera nonicteric.  Neck: Supple, no masses.  No bruit or JVD.  Pulmonary:  Good air movement, clear to auscultation bilaterally, no use of accessory muscles.  Cardiac: RRR, normal S1, S2, no Murmurs. Vascular: scattered varicosities present bilaterally.  Mild venous stasis changes to the legs bilaterally.  3+ soft pitting edema right leg Vessel Right Left  Radial Palpable Palpable  PT Palpable Palpable  DP Palpable Palpable  Gastrointestinal: soft, non-distended. No guarding/no peritoneal signs.  Musculoskeletal: M/S 5/5 throughout.  No deformity or atrophy.  Neurologic: CN 2-12 intact. Pain and light touch intact in extremities.  Symmetrical.  Speech is fluent. Motor exam as listed above. Psychiatric: Judgment intact, Mood & affect appropriate for pt's clinical situation. Dermatologic: mild venous rashes no ulcers noted.  No changes consistent with cellulitis. Lymph : No Cervical lymphadenopathy, no lichenification or skin changes of chronic lymphedema.  CBC Lab Results  Component Value Date   WBC 6.9 02/21/2018   HGB 12.4 (L) 02/21/2018   HCT 36.8 (L) 02/21/2018   MCV 85.3 02/21/2018   PLT 251.0 02/21/2018    BMET    Component Value Date/Time   NA 137 02/21/2018 1156   K 3.9 02/21/2018 1156   CL 104 02/21/2018 1156   CO2 27 02/21/2018 1156   GLUCOSE 92 02/21/2018 1156   BUN 13 02/21/2018 1156   CREATININE 1.28 02/21/2018 1156   CREATININE 1.40 (H) 08/12/2015 1632   CALCIUM 9.3 02/21/2018 1156   GFRNONAA 58 (L) 03/25/2017 0343   GFRAA >60 03/25/2017 0343   Estimated Creatinine Clearance: 56.1 mL/min (by C-G formula based on SCr of 1.28 mg/dL).  COAG Lab Results  Component Value Date   INR 1.07 06/22/2016   INR 1.11  08/01/2015    Radiology Dg Lumbar Spine Complete  Result Date: 02/21/2018 CLINICAL DATA:  Low back pain. EXAM: LUMBAR SPINE - COMPLETE 4+ VIEW COMPARISON:  CT scan dated 09/05/2017 FINDINGS:  There is disc space narrowing from L2-3 through foot L5-S1. Alignment is normal. Moderately severe facet arthritis at L4-5 and L5-S1 and to a lesser degree at L3-4. No fractures or bone destruction. Aortic atherosclerosis. IMPRESSION: Multilevel degenerative disc and joint disease in the lumbar spine. No acute abnormality. Aortic Atherosclerosis (ICD10-I70.0). Electronically Signed   By: Lorriane Shire M.D.   On: 02/21/2018 17:08   Dg Hip Unilat With Pelvis 2-3 Views Left  Result Date: 02/21/2018 CLINICAL DATA:  Left hip pain. EXAM: DG HIP (WITH OR WITHOUT PELVIS) 2-3V LEFT COMPARISON:  CT scan of the abdomen and pelvis dated 09/05/2017 FINDINGS: There are slight degenerative changes at the superolateral aspect of the left acetabulum. Joint space is preserved. Femoral head appears normal. Vascular calcifications around the hip. IMPRESSION: Minimal degenerative changes of the superolateral aspect of the left acetabulum. Electronically Signed   By: Lorriane Shire M.D.   On: 02/21/2018 17:04   Dg Hip Unilat With Pelvis 2-3 Views Right  Result Date: 02/21/2018 CLINICAL DATA:  Right hip pain. EXAM: DG HIP (WITH OR WITHOUT PELVIS) 2-3V RIGHT COMPARISON:  Pelvic radiograph dated 04/25/2017 FINDINGS: There is marked superior joint space narrowing with degenerative changes of the superolateral aspect of the right acetabulum. These findings have progressed since 04/25/2017. There is no fracture or dislocation. Arterial vascular calcifications are present around the right hip. IMPRESSION: Progressive arthritic changes of the right hip as described. Electronically Signed   By: Lorriane Shire M.D.   On: 02/21/2018 17:03    Assessment/Plan 1. Pain in both lower extremities Recommend:  I do not find strong evidence of  Vascular pathology that would explain the patient's symptoms  The patient has atypical pain symptoms for vascular disease  Noninvasive studies of the aorta iliac will be obtained.  The patient should continue walking and begin a more formal exercise program.  The patient should continue his antiplatelet therapy and aggressive treatment of the lipid abnormalities. The patient should begin wearing graduated compression socks 15-20 mmHg strength to control her mild edema.  Patient will follow-up with me with the duplex  Further work-up of her lower extremity pain is deferred to the primary, cardiology and ortho services    - VAS US AORTA/IVC/ILIACS; Future  2. Exertional dyspnea Plan is to see cardiology  3. Primary osteoarthritis of right knee Plan is to see ortho  4. Essential hypertension Continue antihypertensive medications as already ordered, these medications have been reviewed and there are no changes at this time.     Hortencia Pilar, MD  03/05/2018 2:59 PM

## 2018-03-22 DIAGNOSIS — M1611 Unilateral primary osteoarthritis, right hip: Secondary | ICD-10-CM | POA: Diagnosis not present

## 2018-03-22 DIAGNOSIS — M169 Osteoarthritis of hip, unspecified: Secondary | ICD-10-CM | POA: Insufficient documentation

## 2018-03-22 DIAGNOSIS — M533 Sacrococcygeal disorders, not elsewhere classified: Secondary | ICD-10-CM | POA: Diagnosis not present

## 2018-03-22 DIAGNOSIS — M5136 Other intervertebral disc degeneration, lumbar region: Secondary | ICD-10-CM | POA: Diagnosis not present

## 2018-03-22 DIAGNOSIS — M7061 Trochanteric bursitis, right hip: Secondary | ICD-10-CM | POA: Insufficient documentation

## 2018-03-28 ENCOUNTER — Encounter (INDEPENDENT_AMBULATORY_CARE_PROVIDER_SITE_OTHER): Payer: Self-pay | Admitting: Vascular Surgery

## 2018-03-28 ENCOUNTER — Ambulatory Visit (INDEPENDENT_AMBULATORY_CARE_PROVIDER_SITE_OTHER): Payer: Medicare Other | Admitting: Vascular Surgery

## 2018-03-28 ENCOUNTER — Ambulatory Visit (INDEPENDENT_AMBULATORY_CARE_PROVIDER_SITE_OTHER): Payer: Medicare Other

## 2018-03-28 VITALS — BP 145/91 | HR 69 | Resp 18 | Ht 69.0 in | Wt 220.0 lb

## 2018-03-28 DIAGNOSIS — I70203 Unspecified atherosclerosis of native arteries of extremities, bilateral legs: Secondary | ICD-10-CM | POA: Diagnosis not present

## 2018-03-28 DIAGNOSIS — M79604 Pain in right leg: Secondary | ICD-10-CM | POA: Diagnosis not present

## 2018-03-28 DIAGNOSIS — M79605 Pain in left leg: Secondary | ICD-10-CM

## 2018-03-28 DIAGNOSIS — I77810 Thoracic aortic ectasia: Secondary | ICD-10-CM

## 2018-03-28 DIAGNOSIS — M7989 Other specified soft tissue disorders: Secondary | ICD-10-CM | POA: Diagnosis not present

## 2018-03-28 HISTORY — DX: Thoracic aortic ectasia: I77.810

## 2018-03-28 NOTE — Progress Notes (Signed)
Subjective:    Patient ID: Cameron Perna., male    DOB: [birth year], 77 y.o.   MRN: [id number] Chief Complaint  Patient presents with  . Follow-up   Patient presents to review vascular studies.  The patient was initially seen on March 05, 2018 for evaluation of bilateral lower extremity discomfort.  The patient has seen an orthopedist and has undergone injections into his hips and has been diagnosed with degenerative joint disease in the hips and spine.  The patient is also going to be seeing a cardiologist and a pulmonologist as he states he becomes "extremely winded" with ambulation.  The patient underwent an abdominal duplex which was notable for no evidence of abnormal dilatation of the abdominal aorta.  The largest measurement is 2.9 cm.  No elevated velocities are seen within the aortic and iliac vessels.  No aneurysms noted to the iliac arteries.  Biphasic flow through the common iliacs bilaterally triphasic flow through the external iliacs bilaterally.  Patient continues to experience lower extremity discomfort with and without activity however denies any rest pain or ulcer formation to the bilateral legs.  The patient does experience some right lower extremity edema.  At this time he does not engage in conservative therapy.  Patient denies any fever, nausea vomiting.  Review of Systems  Constitutional: Negative.   HENT: Negative.   Eyes: Negative.   Respiratory: Negative.   Cardiovascular: Positive for leg swelling.       Lower Extremity Discomfort  Gastrointestinal: Negative.   Endocrine: Negative.   Genitourinary: Negative.   Musculoskeletal: Negative.   Skin: Negative.   Allergic/Immunologic: Negative.   Neurological: Negative.   Hematological: Negative.   Psychiatric/Behavioral: Negative.       Objective:   Physical Exam  Constitutional: He is oriented to person, place, and time. He appears well-developed and well-nourished. No distress.  HENT:  Head: Normocephalic  and atraumatic.  Right Ear: External ear normal.  Left Ear: External ear normal.  Eyes: Pupils are equal, round, and reactive to light. Conjunctivae and EOM are normal.  Neck: Normal range of motion.  Cardiovascular: Normal rate, regular rhythm, normal heart sounds and intact distal pulses.  Pulses:      Radial pulses are 2+ on the right side, and 2+ on the left side.  Hard to palpate pedal pulses due to edema however the bilateral feet are warm and there is a good capillary refill  Pulmonary/Chest: Effort normal and breath sounds normal.  Musculoskeletal: Normal range of motion. He exhibits edema (Right lower extremity: Moderate edema / Left lower extremity: Mild edema).  Neurological: He is alert and oriented to person, place, and time.  Skin: Skin is warm and dry. He is not diaphoretic.  Psychiatric: He has a normal mood and affect. His behavior is normal. Judgment and thought content normal.  Vitals reviewed.  BP (!) 145/91 (BP Location: Right Arm, Patient Position: Sitting)   Pulse 69   Resp 18   Ht 5' 9"  (1.753 m)   Wt 220 lb (99.8 kg)   BMI 32.49 kg/m   Past Medical History:  Diagnosis Date  . Alcoholism ([place])   . Arrhythmia   . Arthritis   . Asthma   . Atrial fibrillation ([place])    one episode  . Colon polyp   . Depression    Son died 06/14/2014  . Diverticulitis   . Diverticulosis 30 years  . Dyspnea   . Dysrhythmia    At Fib 1995  . Fainting   .  GERD (gastroesophageal reflux disease)   . History of blood clots 2017   pt states" clots in my urine"  . Hyperlipidemia   . Hypertension   . Irritable bowel syndrome   . Prostate cancer ([place]) 2012   treated with radiation therapy. Prostate  . Vasovagal syncope   . Vertigo    Social History   Socioeconomic History  . Marital status: Married    Spouse name: Not on file  . Number of children: Not on file  . Years of education: Not on file  . Highest education level: Not on file  Occupational History  . Not on file   Social Needs  . Financial resource strain: Not on file  . Food insecurity:    Worry: Not on file    Inability: Not on file  . Transportation needs:    Medical: Not on file    Non-medical: Not on file  Tobacco Use  . Smoking status: Former Smoker    Years: 27.00    Last attempt to quit: 04/18/1988    Years since quitting: 29.9  . Smokeless tobacco: Former Systems developer    Types: Chew  Substance and Sexual Activity  . Alcohol use: Yes    Alcohol/week: 4.0 standard drinks    Types: 1 Glasses of wine, 3 Standard drinks or equivalent per week    Comment: Occasional- Former Heavy ETOH Use  . Drug use: No  . Sexual activity: Not Currently  Lifestyle  . Physical activity:    Days per week: Not on file    Minutes per session: Not on file  . Stress: Not on file  Relationships  . Social connections:    Talks on phone: Not on file    Gets together: Not on file    Attends religious service: Not on file    Active member of club or organization: Not on file    Attends meetings of clubs or organizations: Not on file    Relationship status: Not on file  . Intimate partner violence:    Fear of current or ex partner: Not on file    Emotionally abused: Not on file    Physically abused: Not on file    Forced sexual activity: Not on file  Other Topics Concern  . Not on file  Social History Narrative  . Not on file   Past Surgical History:  Procedure Laterality Date  . CARDIAC CATHETERIZATION     [place] no stents  . CATARACT EXTRACTION, BILATERAL    . COLON RESECTION SIGMOID N/A 12/07/2016   Procedure: COLON RESECTION SIGMOID;  Surgeon: Clayburn Pert, MD;  Location: [facility] ORS;  Service: General;  Laterality: N/A;  . COLON SURGERY  11/2016   Colostomy  . COLONOSCOPY  2016  . COLONOSCOPY WITH PROPOFOL N/A 05/30/2016   Procedure: COLONOSCOPY WITH PROPOFOL;  Surgeon: Lollie Sails, MD;  Location: [facility] ENDOSCOPY;  Service: Endoscopy;  Laterality: N/A;  . COLOSTOMY Left 12/07/2016    Procedure: COLOSTOMY;  Surgeon: Clayburn Pert, MD;  Location: [facility] ORS;  Service: General;  Laterality: Left;  . COLOSTOMY REVERSAL N/A 03/21/2017   Procedure: COLOSTOMY REVERSAL;  Surgeon: Clayburn Pert, MD;  Location: [facility] ORS;  Service: General;  Laterality: N/A;  . COLOSTOMY TAKEDOWN N/A 03/21/2017   Procedure: LAPAROSCOPIC COLOSTOMY TAKEDOWN;  Surgeon: Clayburn Pert, MD;  Location: [facility] ORS;  Service: General;  Laterality: N/A;  . CYSTOSCOPY WITH STENT PLACEMENT Bilateral 03/21/2017   Procedure: CYSTOSCOPY WITH LIGHTED STENT PLACEMENT;  Surgeon: Abbie Sons,  MD;  Location: [facility] ORS;  Service: Urology;  Laterality: Bilateral;  . ESOPHAGOGASTRODUODENOSCOPY    . ESOPHAGOGASTRODUODENOSCOPY N/A 05/30/2016   Procedure: ESOPHAGOGASTRODUODENOSCOPY (EGD);  Surgeon: Lollie Sails, MD;  Location: [facility] ENDOSCOPY;  Service: Endoscopy;  Laterality: N/A;  . EYE SURGERY     cataracts  . FLEXIBLE SIGMOIDOSCOPY N/A 03/21/2017   Procedure: FLEXIBLE SIGMOIDOSCOPY;  Surgeon: Clayburn Pert, MD;  Location: [facility] ORS;  Service: General;  Laterality: N/A;  . FRACTURE SURGERY Bilateral    right arm and left wrist  . INCISION AND DRAINAGE ABSCESS N/A 12/07/2016   Procedure: DRAINAGE  OF INTRA ABDOMINAL ABSCESS;  Surgeon: Clayburn Pert, MD;  Location: [facility] ORS;  Service: General;  Laterality: N/A;  . KNEE ARTHROSCOPY    . LAPAROTOMY N/A 12/07/2016   Procedure: EXPLORATORY LAPAROTOMY;  Surgeon: Clayburn Pert, MD;  Location: [facility] ORS;  Service: General;  Laterality: N/A;  . PROSTATE SURGERY     Microwave therapy  . REPLACEMENT TOTAL KNEE Right 07/2016  . TONSILLECTOMY    . TOTAL KNEE ARTHROPLASTY Right 07/27/2016   Procedure: TOTAL KNEE ARTHROPLASTY;  Surgeon: [name], MD;  Location: [facility] ORS;  Service: Orthopedics;  Laterality: Right;  Dr. Erlene Quan had to place Urinary catheter due to prostate cancer history.  Using flexible scope.   Family History  Problem Relation Age of Onset  . Lung  cancer Father   . Other Mother   . Bipolar disorder Son   . Sudden death Son        due to drug over dose and cardiac issues  . Prostate cancer Neg Hx   . Bladder Cancer Neg Hx    Allergies  Allergen Reactions  . Augmentin [Amoxicillin-Pot Clavulanate] Other (See Comments)    Abdominal upset Has patient had a PCN reaction causing immediate rash, facial/tongue/throat swelling, SOB or lightheadedness with hypotension: Unknown Has patient had a PCN reaction causing severe rash involving mucus membranes or skin necrosis: Unknown Has patient had a PCN reaction that required hospitalization: Unknown Has patient had a PCN reaction occurring within the last 10 years: Unknown If all of the above answers are "NO", then may proceed with Cephalosporin use.   . Formaldehyde Rash  . Tape Rash    Pt states he is not allergic to tape.      Assessment & Plan:  Patient presents to review vascular studies.  The patient was initially seen on March 05, 2018 for evaluation of bilateral lower extremity discomfort.  The patient has seen an orthopedist and has undergone injections into his hips and has been diagnosed with degenerative joint disease in the hips and spine.  The patient is also going to be seeing a cardiologist and a pulmonologist as he states he becomes "extremely winded" with ambulation.  The patient underwent an abdominal duplex which was notable for no evidence of abnormal dilatation of the abdominal aorta.  The largest measurement is 2.9 cm.  No elevated velocities are seen within the aortic and iliac vessels.  No aneurysms noted to the iliac arteries.  Biphasic flow through the common iliacs bilaterally triphasic flow through the external iliacs bilaterally.  Patient continues to experience lower extremity discomfort with and without activity however denies any rest pain or ulcer formation to the bilateral legs.  The patient does experience some right lower extremity edema.  At this time he  does not engage in conservative therapy.  Patient denies any fever, nausea vomiting.  1) Leg Pain - Stable I feel that the patient's bilateral  lower extremity discomfort is multifactorial.  At this time, the patient does not have any hemodynamically significant atherosclerotic disease in his aorta or iliac arteries.  I did offer to have the patient come back and undergo a bilateral lower extremity arterial duplex to assess the rest of the leg however at this time the patient would like to wait.  The patient has been diagnosed with degenerative joint disease and this may be playing a role in his discomfort.  I have discussed with the patient at length the risk factors for and pathogenesis of atherosclerotic disease and encouraged a healthy diet, regular exercise regimen and blood pressure / glucose control.  The patient was encouraged to call the office in the interim if he experiences any claudication like symptoms, rest pain or ulcers to his feet / toes.  2) Lower Extremity Edema - New Patient does note trauma to the right calf stating that his calf muscle was ripped off and had to be surgically repaired. This is most likely the cause of the edema noted to the right calf I offered to bring the patient back and have him undergo bilateral lower extremity venous duplex to rule out reflux however this time patient would like to wait The patient was encouraged to wear graduated compression stockings (20-30 mmHg) on a daily basis. The patient was instructed to begin wearing the stockings first thing in the morning and removing them in the evening. The patient was instructed specifically not to sleep in the stockings. Prescription given. In addition, behavioral modification including elevation during the day will be initiated. Anti-inflammatories for pain. Information on compression stockings was given to the patient. The patient was instructed to call the office in the interim if any worsening edema or  ulcerations to the legs, feet or toes occurs. The patient expresses their understanding.  The patient is to follow-up PRN and will call the office if he decides to move forward with any additional arterial or venous testing.  Current Outpatient Medications on File Prior to Visit  Medication Sig Dispense Refill  . amLODipine (NORVASC) 5 MG tablet TAKE 1 TABLET BY MOUTH EVERY DAY 90 tablet 2  . docusate sodium (STOOL SOFTENER) 100 MG capsule Take 100 mg by mouth 2 (two) times daily.    . fluticasone (FLONASE) 50 MCG/ACT nasal spray Place 1 spray into both nostrils daily as needed for allergies or rhinitis.    . hydrocortisone 2.5 % cream APPLY CREAM TO AFFECTED AREA TWICE DAILY AS NEEDED FOR RASH 28.35 g 0  . lisinopril (PRINIVIL,ZESTRIL) 10 MG tablet TAKE 1 TABLET BY MOUTH EVERY DAY 90 tablet 1  . liver oil-zinc oxide (DESITIN) 40 % ointment Apply 1 application topically daily as needed for irritation.    . metoprolol succinate (TOPROL-XL) 25 MG 24 hr tablet TAKE 1 TABLET BY MOUTH DAILY 90 tablet 2  . omeprazole (PRILOSEC) 20 MG capsule Take 20 mg by mouth daily as needed (heartburn).     . sertraline (ZOLOFT) 100 MG tablet TAKE 1 TABLET BY MOUTH EVERY DAY 90 tablet 0   No current facility-administered medications on file prior to visit.    There are no Patient Instructions on file for this visit. No follow-ups on file.  Cameron Foster A Karene Bracken, PA-C

## 2018-03-29 ENCOUNTER — Ambulatory Visit (INDEPENDENT_AMBULATORY_CARE_PROVIDER_SITE_OTHER): Payer: Medicare Other

## 2018-03-29 ENCOUNTER — Encounter: Payer: Self-pay | Admitting: Pulmonary Disease

## 2018-03-29 ENCOUNTER — Other Ambulatory Visit: Payer: Self-pay

## 2018-03-29 ENCOUNTER — Ambulatory Visit (INDEPENDENT_AMBULATORY_CARE_PROVIDER_SITE_OTHER): Payer: Medicare Other | Admitting: Pulmonary Disease

## 2018-03-29 VITALS — BP 138/80 | HR 74 | Ht 66.5 in | Wt 220.8 lb

## 2018-03-29 DIAGNOSIS — Z8679 Personal history of other diseases of the circulatory system: Secondary | ICD-10-CM | POA: Diagnosis not present

## 2018-03-29 DIAGNOSIS — R600 Localized edema: Secondary | ICD-10-CM

## 2018-03-29 DIAGNOSIS — Z87891 Personal history of nicotine dependence: Secondary | ICD-10-CM | POA: Diagnosis not present

## 2018-03-29 DIAGNOSIS — R0609 Other forms of dyspnea: Secondary | ICD-10-CM | POA: Diagnosis not present

## 2018-03-29 DIAGNOSIS — I70203 Unspecified atherosclerosis of native arteries of extremities, bilateral legs: Secondary | ICD-10-CM | POA: Diagnosis not present

## 2018-03-29 DIAGNOSIS — R0600 Dyspnea, unspecified: Secondary | ICD-10-CM

## 2018-03-29 NOTE — Patient Instructions (Signed)
The following tests have been ordered to evaluate shortness of breath: 1) pulmonary function tests (PFTs) 2) echocardiogram to assess heart function 3) chest x-ray  Follow-up in 1 week to review the results of these tests and discuss further evaluation and management

## 2018-03-29 NOTE — Progress Notes (Signed)
PULMONARY CONSULT NOTE  Requesting MD/Service: Caryl Bis, MD Date of initial consultation: 03/29/18 Reason for consultation: Dyspnea  PT PROFILE: 77 y.o. male former smoker (approx 30 P-Y hx, quit 24-Jun-1988) with with no significant past pulmonary history referred for evaluation of progressive dyspnea over approximately 1 year's duration  DATA: 10/05/15 Echocardiogram: LVEF 50-55%. MIld concentric LVH. LA mildly dilated. Mild MR. RVSP estimate within normal range 08/19/16 RLE venous US: No evidence of DVT within the right lower extremity. Fluid collection on the medial side of the right calf measuring 11.2 x 1.4 x 3.4 cm likely reflecting a a liquefying hematoma 06/15/16 CT chest: (After fall) Mildly displaced left lateral seventh and eighth rib fractures. No pulmonary or cardiac pathology   INTERVAL:  HPI:  As above.  He reports approximately 1 year of gradually progressive exertional dyspnea.  At the present time he describes class II-III dyspnea with little day-to-day variation.  He is able to walk approximately 100 m on flat ground.  He he does not know if he can walk 1 flight of stairs as he has no occasion to walk steps..  He notes the onset of his symptoms corresponds to approximately last summer.  Notably, he was hospitalized for 9 days in August with a ruptured diverticulum and pelvic abscess that required laparotomy with colostomy.  He underwent colostomy reversal in December 2018.  He denies chest pain.  He does have intermittent cough productive of scant mucus.  He also describes significant nasal congestion and posterior nasal drainage.  He denies orthopnea.  On occasion however he has awakened from sleep out of breath.  There is no history of asthma or heart failure.  He has chronic right lower extremity edema which is been present for many years.  He has found no alleviating factors.  Other than exertion, he is unable to identify any exacerbating factors.  Dyspnea does not appear to be  related to weather factors or other exposures.  He is a former smoker of approximately 1 pack cigarettes per day for 30-35 years.  He quit in Jun 24, 1988.  He was previously employed briefly in the [employer] and then in the [employer].  Presently, he has no significant occupational or environmental exposures.  He was not in the [employer].  He has no significant travel history.  There is no history of significant sick exposures.  Past Medical History:  Diagnosis Date  . Alcoholism ([place])   . Arthritis   . Atrial fibrillation ([place])    one episode  . Colon polyp   . Depression    Son died 06/24/14  . Diverticulitis   . Diverticulosis 30 years  . Dysrhythmia    At Fib 1995  . GERD (gastroesophageal reflux disease)   . Hyperlipidemia   . Hypertension   . Irritable bowel syndrome   . Prostate cancer ([place]) 2010-06-24   treated with radiation therapy. Prostate  . Vasovagal syncope     Past Surgical History:  Procedure Laterality Date  . CARDIAC CATHETERIZATION     [place] no stents  . CATARACT EXTRACTION, BILATERAL    . COLON RESECTION SIGMOID N/A 12/07/2016   Procedure: COLON RESECTION SIGMOID;  Surgeon: Clayburn Pert, MD;  Location: [facility] ORS;  Service: General;  Laterality: N/A;  . COLON SURGERY  11/2016   Colostomy  . COLONOSCOPY  24-Jun-2014  . COLONOSCOPY WITH PROPOFOL N/A 05/30/2016   Procedure: COLONOSCOPY WITH PROPOFOL;  Surgeon: Lollie Sails, MD;  Location: [facility] ENDOSCOPY;  Service: Endoscopy;  Laterality: N/A;  .  COLOSTOMY Left 12/07/2016   Procedure: COLOSTOMY;  Surgeon: Clayburn Pert, MD;  Location: [facility] ORS;  Service: General;  Laterality: Left;  . COLOSTOMY REVERSAL N/A 03/21/2017   Procedure: COLOSTOMY REVERSAL;  Surgeon: Clayburn Pert, MD;  Location: [facility] ORS;  Service: General;  Laterality: N/A;  . COLOSTOMY TAKEDOWN N/A 03/21/2017   Procedure: LAPAROSCOPIC COLOSTOMY TAKEDOWN;  Surgeon: Clayburn Pert, MD;  Location: [facility] ORS;  Service: General;  Laterality:  N/A;  . CYSTOSCOPY WITH STENT PLACEMENT Bilateral 03/21/2017   Procedure: CYSTOSCOPY WITH LIGHTED STENT PLACEMENT;  Surgeon: Abbie Sons, MD;  Location: [facility] ORS;  Service: Urology;  Laterality: Bilateral;  . ESOPHAGOGASTRODUODENOSCOPY    . ESOPHAGOGASTRODUODENOSCOPY N/A 05/30/2016   Procedure: ESOPHAGOGASTRODUODENOSCOPY (EGD);  Surgeon: Lollie Sails, MD;  [facility] ENDOSCOPY;  Service: Endoscopy;  Laterality: N/A;  . EYE SURGERY     cataracts  . FLEXIBLE SIGMOIDOSCOPY N/A 03/21/2017   Procedure: FLEXIBLE SIGMOIDOSCOPY;  Surgeon: Clayburn Pert, MD;  Location: [facility] ORS;  Service: General;  Laterality: N/A;  . FRACTURE SURGERY Bilateral    right arm and left wrist  . INCISION AND DRAINAGE ABSCESS N/A 12/07/2016   Procedure: DRAINAGE  OF INTRA ABDOMINAL ABSCESS;  Surgeon: Clayburn Pert, MD;  Location: [facility] ORS;  Service: General;  Laterality: N/A;  . KNEE ARTHROSCOPY    . LAPAROTOMY N/A 12/07/2016   Procedure: EXPLORATORY LAPAROTOMY;  Surgeon: Clayburn Pert, MD;  Location: [facility] ORS;  Service: General;  Laterality: N/A;  . PROSTATE SURGERY     Microwave therapy  . REPLACEMENT TOTAL KNEE Right 07/2016  . TONSILLECTOMY    . TOTAL KNEE ARTHROPLASTY Right 07/27/2016   Procedure: TOTAL KNEE ARTHROPLASTY;  Surgeon: [name], MD;  Location: [facility] ORS;  Service: Orthopedics;  Laterality: Right;  Dr. Erlene Quan had to place Urinary catheter due to prostate cancer history.  Using flexible scope.    MEDICATIONS: I have reviewed all medications and confirmed regimen as documented  Social History   Socioeconomic History  . Marital status: Married    Spouse name: Not on file  . Number of children: Not on file  . Years of education: Not on file  . Highest education level: Not on file  Occupational History  . Not on file  Social Needs  . Financial resource strain: Not on file  . Food insecurity:    Worry: Not on file    Inability: Not on file  . Transportation needs:     Medical: Not on file    Non-medical: Not on file  Tobacco Use  . Smoking status: Former Smoker    Years: 27.00    Last attempt to quit: 04/18/1988    Years since quitting: 29.9  . Smokeless tobacco: Former Systems developer    Types: Chew  Substance and Sexual Activity  . Alcohol use: Yes    Alcohol/week: 4.0 standard drinks    Types: 1 Glasses of wine, 3 Standard drinks or equivalent per week    Comment: Occasional- Former Heavy ETOH Use  . Drug use: No  . Sexual activity: Not Currently  Lifestyle  . Physical activity:    Days per week: Not on file    Minutes per session: Not on file  . Stress: Not on file  Relationships  . Social connections:    Talks on phone: Not on file    Gets together: Not on file    Attends religious service: Not on file    Active member of club or organization: Not on file  Attends meetings of clubs or organizations: Not on file    Relationship status: Not on file  . Intimate partner violence:    Fear of current or ex partner: Not on file    Emotionally abused: Not on file    Physically abused: Not on file    Forced sexual activity: Not on file  Other Topics Concern  . Not on file  Social History Narrative  . Not on file    Family History  Problem Relation Age of Onset  . Lung cancer Father   . Other Mother   . Bipolar disorder Son   . Sudden death Son        due to drug over dose and cardiac issues  . Prostate cancer Neg Hx   . Bladder Cancer Neg Hx     ROS: No fever, myalgias/arthralgias, unexplained weight loss or weight gain No new focal weakness or sensory deficits No otalgia, hearing loss, visual changes, nasal and sinus symptoms, mouth and throat problems No neck pain or adenopathy No abdominal pain, N/V/D, diarrhea, change in bowel pattern No dysuria, change in urinary pattern   Vitals:   03/29/18 1405  BP: 138/80  Pulse: 74  SpO2: 97%  Weight: 220 lb 12.8 oz (100.2 kg)  Height: 5' 6.5" (1.689 m)     EXAM:  Gen: WDWN, No  overt respiratory distress, mildly obese HEENT: NCAT, sclera [place], oropharynx normal Neck: Supple without LAN, thyromegaly, JVD Lungs: breath sounds full, percussion normal, adventitious sounds: None Cardiovascular: RRR, soft systolic ejection murmur Abdomen: Soft, nontender, normal BS Ext: without clubbing, cyanosis.  Brawny RLE pretibial and ankle edema Neuro: CNs grossly intact, motor and sensory intact Skin: Rosacea noted.  Innumerable actinic keratoses noted on back.  Few telangiectasias noted on back.  DATA:   BMP Latest Ref Rng & Units 02/21/2018 09/05/2017 04/19/2017  Glucose 70 - 99 mg/dL 92 - 79  BUN 6 - 23 mg/dL 13 - 13  Creatinine 0.40 - 1.50 mg/dL 1.28 1.30(H) 1.22  Sodium 135 - 145 mEq/L 137 - 139  Potassium 3.5 - 5.1 mEq/L 3.9 - 3.9  Chloride 96 - 112 mEq/L 104 - 104  CO2 19 - 32 mEq/L 27 - 28  Calcium 8.4 - 10.5 mg/dL 9.3 - 9.4    CBC Latest Ref Rng & Units 02/21/2018 03/22/2017 03/21/2017  WBC 4.0 - 10.5 K/uL 6.9 10.4 14.8(H)  Hemoglobin 13.0 - 17.0 g/dL 12.4(L) 11.1(L) 12.1(L)  Hematocrit 39.0 - 52.0 % 36.8(L) 32.5(L) 37.0(L)  Platelets 150.0 - 400.0 K/uL 251.0 251 272    CXR 02/23/17:  NACPD  I have personally reviewed all chest radiographs reported above including CXRs and CT chest unless otherwise indicated  IMPRESSION:     ICD-10-CM   1. Former smoker Z87.891   2. DOE (dyspnea on exertion) R06.09 DG Chest 2 View    Pulmonary Function Test [facility] Only    ECHOCARDIOGRAM COMPLETE  3. History of atrial fibrillation Z86.79   4. Chronic RLE edema, no DVT on RLE venous US 08/2016 R60.0    His exam reveals no definite evidence of cardiac or pulmonary disease.  However, he is a former smoker and might have some COPD that was "unmasked" when he was hospitalized last year.  He does have the chronic right lower extremity edema but this has been evaluated for DVT in the past and there was no evidence of such.  Therefore, presently, I do not feel that it warrants further  evaluation.  PLAN:  The following tests have been ordered and evaluation of dyspnea: 1) echocardiogram 2) PFTs 3) chest x-ray  He will follow-up with me on 12/19 to review the results of the above tests and discuss further evaluation and management   [name], MD PCCM service Mobile [phone] Pager [phone] 03/29/2018 2:37 PM

## 2018-03-30 ENCOUNTER — Telehealth: Payer: Self-pay

## 2018-03-30 LAB — ECHOCARDIOGRAM COMPLETE
Height: 66.5 in
Weight: 3532.8 oz

## 2018-03-30 NOTE — Telephone Encounter (Signed)
Dr. Bonna Gains, please advise:  Pt has called c/o stomach swollen, keeps getting worse (bigger). Denies any rectal bleeding, no n/v. Is short of breath but cannot tell me this is worse with the swelling. He has heart and lung problems. Also has been seen at [facility] for Diverticulosis, Diarrhea, Tortuous colon, and hx of colon polyps. Pt states he has no history of cirrhosis of liver but past history he was a heavy drinker. Pt states that this is not an emergency but would like to be seen earlier. I did inform him of no early appts available that he could go to the ER if needed.  Appointment scheduled for Jan. 2, 2020. Schedule is full till then. Last seen in our office 08/28/2017. CT abd/pelvis was ordered and completed 09/05/17.

## 2018-04-03 ENCOUNTER — Ambulatory Visit (HOSPITAL_COMMUNITY): Payer: Medicare Other

## 2018-04-03 ENCOUNTER — Ambulatory Visit
Admission: RE | Admit: 2018-04-03 | Discharge: 2018-04-03 | Disposition: A | Discharge status: Home / Self Care | Payer: Medicare Other | Source: Ambulatory Visit | Attending: Pulmonary Disease | Admitting: Pulmonary Disease

## 2018-04-03 DIAGNOSIS — R0609 Other forms of dyspnea: Secondary | ICD-10-CM

## 2018-04-03 DIAGNOSIS — R0602 Shortness of breath: Secondary | ICD-10-CM | POA: Diagnosis not present

## 2018-04-03 DIAGNOSIS — R0600 Dyspnea, unspecified: Secondary | ICD-10-CM

## 2018-04-03 IMAGING — CR DG CHEST 2V
1 series · 2 of 2 positions shown · non-contrast
Comparison: [DATE]

CLINICAL DATA: Dyspnea, shortness of breath with exertion for 3-4
months, history atrial fibrillation, hypertension, former smoker

EXAM:
CHEST - 2 VIEW

[Series 1: dg chest 2 view · 0.14mm/px · 2 of 2 slices shown]
[im 1/2]
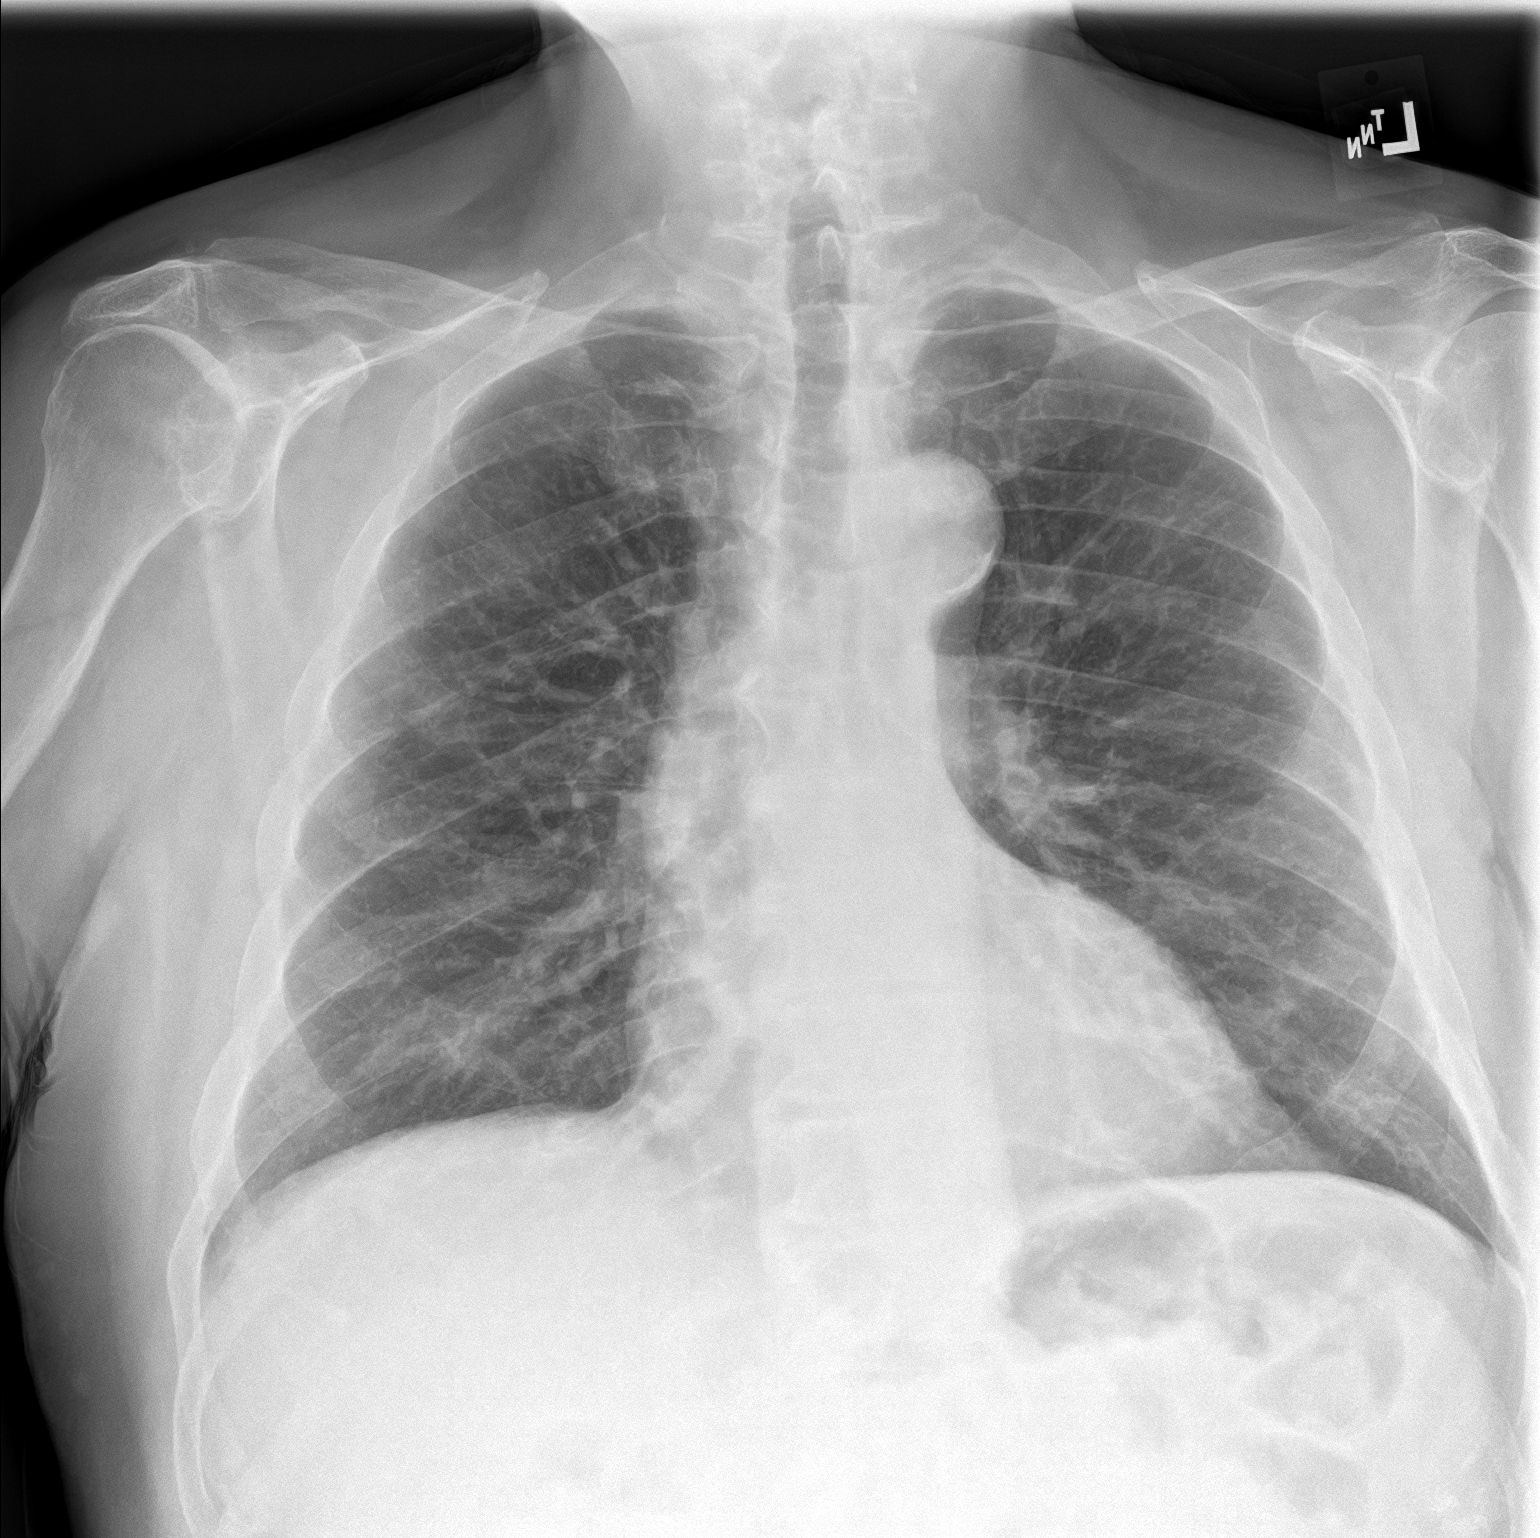
[im 2/2]
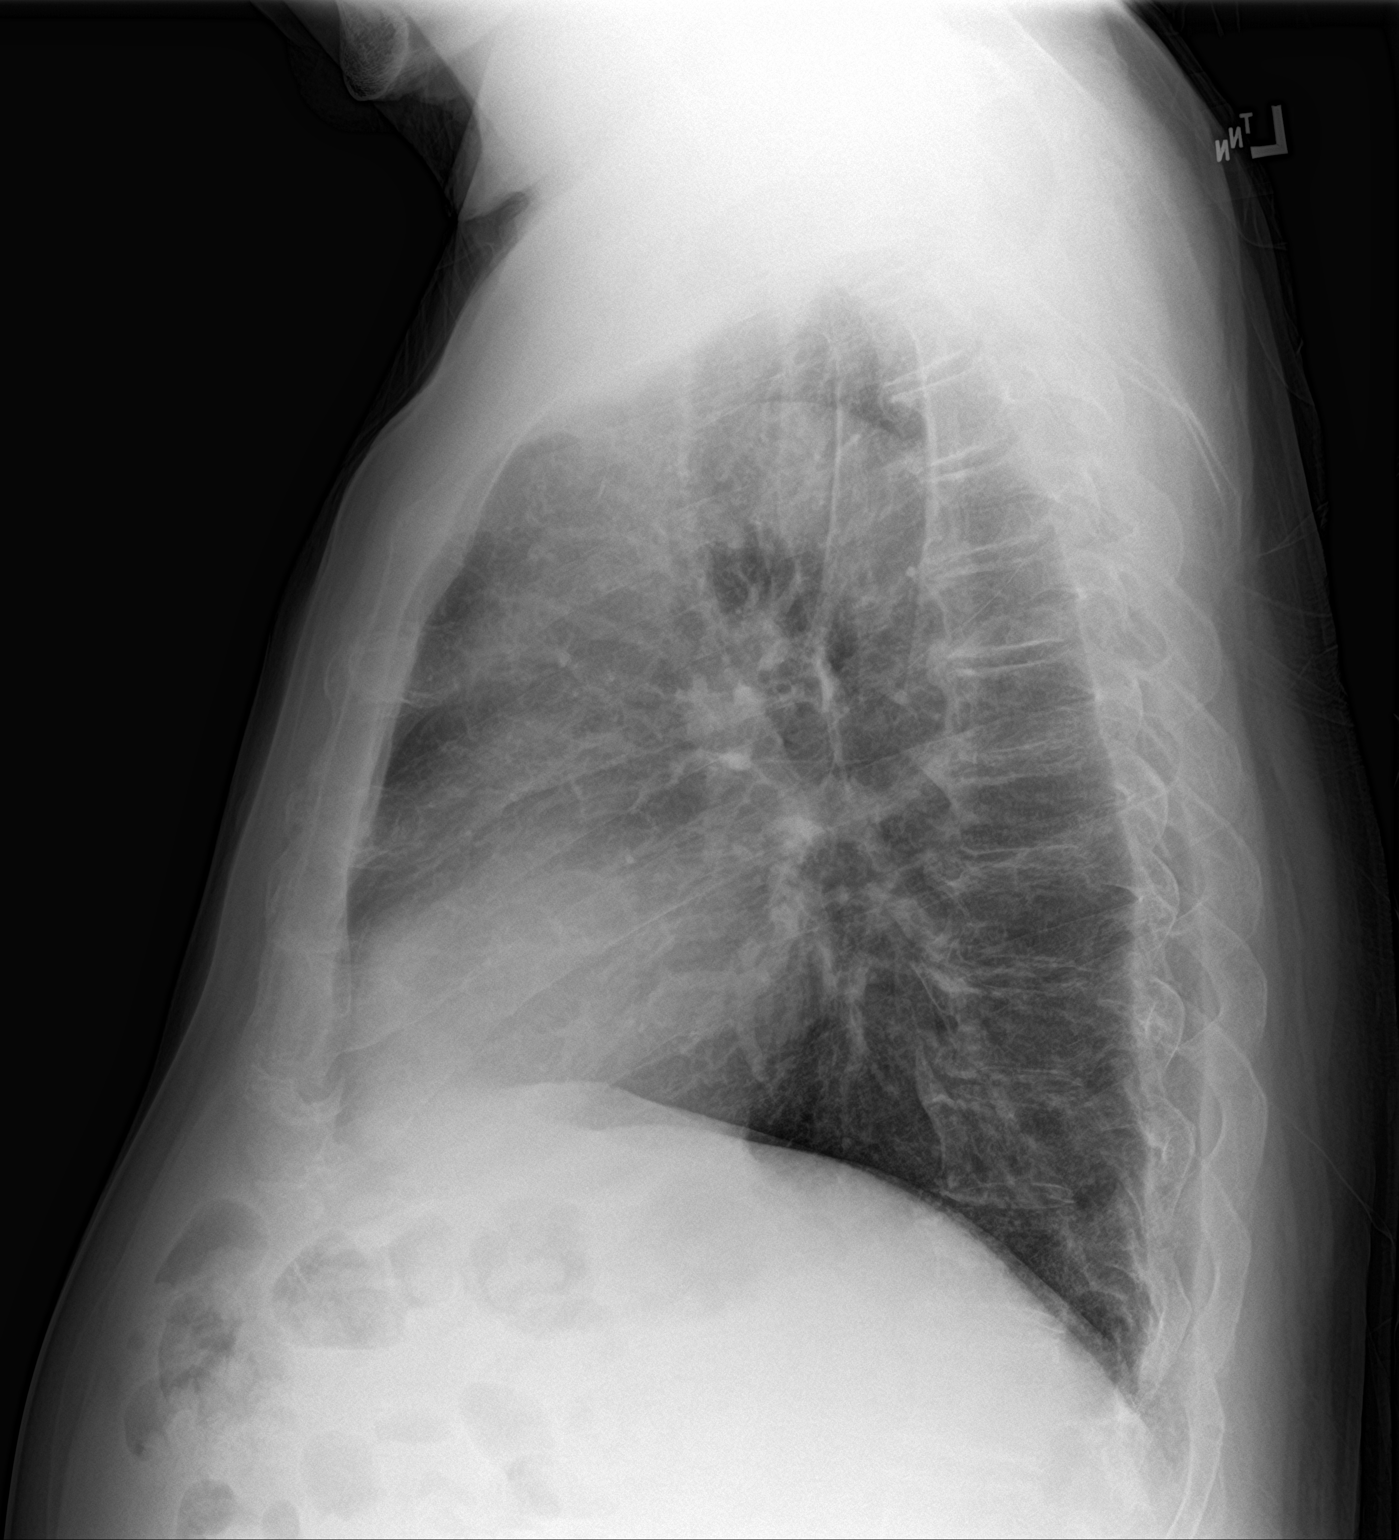

[2 of 2 positions shown; findings below may reference images not displayed]

FINDINGS: Normal heart size, mediastinal contours, and pulmonary vascularity.

Atherosclerotic calcification aorta.

Lungs clear.

No pulmonary infiltrate, pleural effusion or pneumothorax.

Bones demineralized with multilevel endplate spur formation of the
thoracic spine.
IMPRESSION: No acute abnormalities.

## 2018-04-03 NOTE — Telephone Encounter (Signed)
Left pt message at both home/cell numbers to contact office that we will work pt in at 1:00 pm if he is available. Please contact us and let us know.

## 2018-04-04 ENCOUNTER — Ambulatory Visit (INDEPENDENT_AMBULATORY_CARE_PROVIDER_SITE_OTHER): Payer: Medicare Other | Admitting: Gastroenterology

## 2018-04-04 ENCOUNTER — Encounter: Payer: Self-pay | Admitting: Gastroenterology

## 2018-04-04 ENCOUNTER — Encounter

## 2018-04-04 VITALS — BP 160/81 | HR 73 | Ht 66.5 in | Wt 216.6 lb

## 2018-04-04 DIAGNOSIS — Z8719 Personal history of other diseases of the digestive system: Secondary | ICD-10-CM

## 2018-04-04 NOTE — Telephone Encounter (Signed)
Pt has an appt today at 1pm.

## 2018-04-04 NOTE — Progress Notes (Signed)
Vonda Antigua, MD [address]  [place]  [place], [place] [address]  Main: [phone]  Fax: [phone]   Primary Care Physician: Leone Haven, MD  Primary Gastroenterologist:  Dr. Vonda Antigua  Chief Complaint  Patient presents with  . Abdominal Pain    c/o stomach swollen, SOB    HPI: Lauren Aguayo. is a 77 y.o. male with history of diverticulitis here for follow-up.  Last seen in May 2019 at which time he complained of bilateral lower quadrant abdominal pain.  CT at that time showed diverticulitis and he was treated with outpatient antibiotics.  Patient denies any further abdominal pain.  No fever or chills.  States he is undergoing work-up for dyspnea on exertion.  Has seen pulmonology and has had lung function test with results pending.  Patient reports noting mild abdominal distention recently and wanted to inquire if his shortness of breath could be coming from that.  No changes in bowel movements.  No abdominal pain.  No nausea or vomiting.  No heartburn.  No dysphagia  Previous history:  Patient was previously a patient of [facility]. In April 2017 he was found to have diverticulitis with CT showing pericolic inflammatory changes at the descending and sigmoid colon. In October 2017 repeat CT scan also showed sigmoid diverticulitis. February 2018 CT scan showed resolved sigmoid diverticulitis since October. In February 2018 a colonoscopy was attempted, extent of exam was sigmoid colon, colonoscope could not be advanced past the site as per Dr. Marton Redwood endoscopy report. Virtual colonoscopy was ordered by [facility], and no significant colonic polypoid lesion, mass, stricture was reported. In August 2018 patient was found to have new pneumoperitoneum and bowel perforation secondary to diverticulitis and he underwent Hartman's procedure with end colostomy in August 2018 by Dr. Adonis Huguenin. This was reversed on December 4 by Dr.  Adonis Huguenin.  His previous EGD also showed gastric ulcers and biopsies showed H. pylori. As per [facility] clinic notes this was treated.   Current Outpatient Medications  Medication Sig Dispense Refill  . amLODipine (NORVASC) 5 MG tablet TAKE 1 TABLET BY MOUTH EVERY DAY 90 tablet 2  . docusate sodium (STOOL SOFTENER) 100 MG capsule Take 100 mg by mouth 2 (two) times daily.    . fluticasone (FLONASE) 50 MCG/ACT nasal spray Place 1 spray into both nostrils daily as needed for allergies or rhinitis.    . hydrocortisone 2.5 % cream APPLY CREAM TO AFFECTED AREA TWICE DAILY AS NEEDED FOR RASH 28.35 g 0  . lisinopril (PRINIVIL,ZESTRIL) 10 MG tablet TAKE 1 TABLET BY MOUTH EVERY DAY 90 tablet 1  . liver oil-zinc oxide (DESITIN) 40 % ointment Apply 1 application topically daily as needed for irritation.    . metoprolol succinate (TOPROL-XL) 25 MG 24 hr tablet TAKE 1 TABLET BY MOUTH DAILY 90 tablet 2  . omeprazole (PRILOSEC) 20 MG capsule Take 20 mg by mouth daily as needed (heartburn).     . sertraline (ZOLOFT) 100 MG tablet TAKE 1 TABLET BY MOUTH EVERY DAY 90 tablet 0   No current facility-administered medications for this visit.     Allergies as of 04/04/2018 - Review Complete 04/04/2018  Allergen Reaction Noted  . Augmentin [amoxicillin-pot clavulanate] Other (See Comments) 01/03/2017  . Formaldehyde Rash 01/20/2015  . Tape Rash 09/07/2016    ROS:  General: Negative for anorexia, weight loss, fever, chills, fatigue, weakness. ENT: Negative for hoarseness, difficulty swallowing , nasal congestion. CV: Negative for chest pain, angina,  palpitations, dyspnea on exertion, peripheral edema.  Respiratory: Negative for dyspnea at rest, dyspnea on exertion, cough, sputum, wheezing.  GI: See history of present illness. GU:  Negative for dysuria, hematuria, urinary incontinence, urinary frequency, nocturnal urination.  Endo: Negative for unusual weight change.    Physical Examination:   BP (!)  160/81   Pulse 73   Ht 5' 6.5" (1.689 m)   Wt 216 lb 9.6 oz (98.2 kg)   BMI 34.44 kg/m   General: Well-nourished, well-developed in no acute distress.  Eyes: No icterus. Conjunctivae pink. Mouth: Oropharyngeal mucosa moist and pink , no lesions erythema or exudate. Neck: Supple, Trachea midline Abdomen: Bowel sounds are normal, nontender, nondistended, no hepatosplenomegaly or masses, no abdominal bruits or hernia , no rebound or guarding.   Extremities: No lower extremity edema. No clubbing or deformities. Neuro: Alert and oriented x 3.  Grossly intact. Skin: Warm and dry, no jaundice.   Psych: Alert and cooperative, normal mood and affect.   Labs: CMP     Component Value Date/Time   NA 137 02/21/2018 1156   K 3.9 02/21/2018 1156   CL 104 02/21/2018 1156   CO2 27 02/21/2018 1156   GLUCOSE 92 02/21/2018 1156   BUN 13 02/21/2018 1156   CREATININE 1.28 02/21/2018 1156   CREATININE 1.40 (H) 08/12/2015 1632   CALCIUM 9.3 02/21/2018 1156   PROT 7.4 02/21/2018 1156   ALBUMIN 4.0 02/21/2018 1156   AST 17 02/21/2018 1156   ALT 8 02/21/2018 1156   ALKPHOS 74 02/21/2018 1156   BILITOT 0.5 02/21/2018 1156   GFRNONAA 58 (L) 03/25/2017 0343   GFRAA >60 03/25/2017 0343   Lab Results  Component Value Date   WBC 6.9 02/21/2018   HGB 12.4 (L) 02/21/2018   HCT 36.8 (L) 02/21/2018   MCV 85.3 02/21/2018   PLT 251.0 02/21/2018    Imaging Studies: Dg Chest 2 View  Result Date: 04/04/2018 CLINICAL DATA:  Dyspnea, shortness of breath with exertion for 3-4 months, history atrial fibrillation, hypertension, former smoker EXAM: CHEST - 2 VIEW COMPARISON:  02/23/2017 FINDINGS: Normal heart size, mediastinal contours, and pulmonary vascularity. Atherosclerotic calcification aorta. Lungs clear. No pulmonary infiltrate, pleural effusion or pneumothorax. Bones demineralized with multilevel endplate spur formation of the thoracic spine. IMPRESSION: No acute abnormalities. Electronically Signed    By: Lavonia Dana M.D.   On: 04/04/2018 09:15   Vas US Aorta/ivc/iliacs  Result Date: 03/29/2018 ABDOMINAL AORTA STUDY Indications: Pain in Legs  Performing Technologist: Almira Coaster RVS  Examination Guidelines: A complete evaluation includes B-mode imaging, spectral Doppler, color Doppler, and power Doppler as needed of all accessible portions of each vessel. Bilateral testing is considered an integral part of a complete examination. Limited examinations for reoccurring indications may be performed as noted.  Abdominal Aorta Findings: +-------------+-------+----------+----------+---------+--------+--------+ Location     AP (cm)Trans (cm)PSV (cm/s)Waveform ThrombusComments +-------------+-------+----------+----------+---------+--------+--------+ Proximal     2.78   2.92      53        biphasic                  +-------------+-------+----------+----------+---------+--------+--------+ Mid          2.33   2.18      97        biphasic                  +-------------+-------+----------+----------+---------+--------+--------+ Distal       2.59   2.82      68  biphasic                  +-------------+-------+----------+----------+---------+--------+--------+ RT CIA Prox  2.2    2.3       59        biphasic                  +-------------+-------+----------+----------+---------+--------+--------+ RT CIA Distal1.1    1.1       82        biphasic                  +-------------+-------+----------+----------+---------+--------+--------+ RT EIA Prox                   97        triphasic                 +-------------+-------+----------+----------+---------+--------+--------+ RT EIA Distal1.2    1.1       95        triphasic                 +-------------+-------+----------+----------+---------+--------+--------+ LT CIA Prox  1.8    1.9       59        biphasic                   +-------------+-------+----------+----------+---------+--------+--------+ LT CIA Distal1.6    1.6       94        biphasic                  +-------------+-------+----------+----------+---------+--------+--------+ LT EIA Prox                   98        triphasic                 +-------------+-------+----------+----------+---------+--------+--------+ LT EIA Distal1.2    1.2       94        triphasic                 +-------------+-------+----------+----------+---------+--------+--------+  Rt CFA has Calcified Plaque; diameter is 1.26cmx1.24cms.  Summary: Abdominal Aorta: There is evidence of abnormal dilitation of the Proximal Abdominal aorta. The largest aortic measurement is 2.9 cm. No Elevated Velocities seen in Vessels Insonated.  *See table(s) above for measurements and observations.  Electronically signed by Hortencia Pilar MD on 03/29/2018 at 5:14:41 PM.   Final     Assessment and Plan:   [name] is a 77 y.o. y/o male with history of diverticulitis here for follow-up of abdominal pain  Abdominal pain has completely resolved Continue follow-up with pulmonology and primary care provider in regard to his dyspnea on exertion that apparently is new as per the patient Symptoms of previous diverticulitis have resolved  Patient would not like to proceed with any colonoscopies at this time as he is undergoing work-up for shortness of breath which is reasonable In addition, previous attempts for colonoscopy, by Dr. Gustavo Lah, were unable to get past the sigmoid and then he had CT colonoscopies.  Also had perforation requiring partial colectomy in the past.  Therefore, any colonoscopies will need to be done after risks and benefits are carefully weighed with the patient prior to any procedures.  His abdominal exam is completely benign.  I have reassured him that his abdomen is not causing his shortness of breath and he should follow-up closely with pulmonology in this  regard.  I did offer him a  CT scan to evaluate his abdominal organs and rule out any pathology, but he would like to hold off on this and will call us if he would like this done in the future.  Dr Vonda Antigua

## 2018-04-05 ENCOUNTER — Encounter: Payer: Self-pay | Admitting: Pulmonary Disease

## 2018-04-05 ENCOUNTER — Ambulatory Visit: Payer: Medicare Other | Admitting: Pulmonary Disease

## 2018-04-05 ENCOUNTER — Ambulatory Visit (INDEPENDENT_AMBULATORY_CARE_PROVIDER_SITE_OTHER): Payer: Medicare Other | Admitting: Pulmonary Disease

## 2018-04-05 VITALS — Resp 16 | Ht 66.5 in | Wt 215.0 lb

## 2018-04-05 DIAGNOSIS — R0609 Other forms of dyspnea: Secondary | ICD-10-CM

## 2018-04-05 DIAGNOSIS — J310 Chronic rhinitis: Secondary | ICD-10-CM

## 2018-04-05 DIAGNOSIS — M533 Sacrococcygeal disorders, not elsewhere classified: Secondary | ICD-10-CM | POA: Diagnosis not present

## 2018-04-05 DIAGNOSIS — R0600 Dyspnea, unspecified: Secondary | ICD-10-CM

## 2018-04-05 DIAGNOSIS — I70203 Unspecified atherosclerosis of native arteries of extremities, bilateral legs: Secondary | ICD-10-CM

## 2018-04-05 DIAGNOSIS — M1611 Unilateral primary osteoarthritis, right hip: Secondary | ICD-10-CM | POA: Diagnosis not present

## 2018-04-05 MED ORDER — FLUTICASONE PROPIONATE 50 MCG/ACT NA SUSP: 2.0000 | Freq: Every day | NASAL | 10 refills | Status: DC

## 2018-04-05 NOTE — Patient Instructions (Signed)
Flonase nasal spray, 2 sprays per nostril daily Follow-up with Dr. Saunders Revel as scheduled January 15 Follow-up in this office in 3 months.  Call sooner if needed

## 2018-04-05 NOTE — Progress Notes (Signed)
PULMONARY CONSULT NOTE  Requesting MD/Service: Cameron Bis, MD Date of initial consultation: 03/29/18 Reason for consultation: Dyspnea  PT PROFILE: 77 y.o. male former smoker (approx 30 P-Y hx, quit 1990) with with no significant past pulmonary history referred for evaluation of progressive dyspnea over approximately 1 year's duration  DATA: 10/05/15 Echocardiogram: LVEF 50-55%. MIld concentric LVH. LA mildly dilated. Mild MR. RVSP estimate within normal range 08/19/16 RLE venous US: No evidence of DVT within the right lower extremity. Fluid collection on the medial side of the right calf measuring 11.2 x 1.4 x 3.4 cm likely reflecting a a liquefying hematoma 06/15/16 CT chest: (After fall) Mildly displaced left lateral seventh and eighth rib fractures. No pulmonary or cardiac pathology  03/29/18 Echocardiogram: LVEF 60-65%. LA mildly dilated. R side normal. RVSP estimate normal 04/03/18 PFTs: Spirometry normal, lung volumes normal, DLCO normal  INTERVAL: No major events  SUBJ:  No new complaints. No major events since last seen. Continues to have DOE, unchanged. Denies CP, fever, purulent sputum, hemoptysis, LE edema and calf tenderness.  As an aside, he reports chronic nasal congestion and rhinorrhea. He uses an OTC decongestant. He has Flonase on his med list but is not using it.  Vitals:   04/05/18 1336  Resp: 16  Weight: 215 lb (97.5 kg)  Height: 5' 6.5" (1.689 m)     EXAM:  Gen: NAD HEENT: NCAT, sclera [name]: No JVD Lungs: breath sounds full, no wheezes or other adventitious sounds Cardiovascular: RRR, no murmurs Abdomen: Soft, nontender, normal BS Ext: without clubbing, cyanosis, edema Neuro: grossly intact Skin: Limited exam, no new findings   DATA:   BMP Latest Ref Rng & Units 02/21/2018 09/05/2017 04/19/2017  Glucose 70 - 99 mg/dL 92 - 79  BUN 6 - 23 mg/dL 13 - 13  Creatinine 0.40 - 1.50 mg/dL 1.28 1.30(H) 1.22  Sodium 135 - 145 mEq/L 137 - 139  Potassium  3.5 - 5.1 mEq/L 3.9 - 3.9  Chloride 96 - 112 mEq/L 104 - 104  CO2 19 - 32 mEq/L 27 - 28  Calcium 8.4 - 10.5 mg/dL 9.3 - 9.4    CBC Latest Ref Rng & Units 02/21/2018 03/22/2017 03/21/2017  WBC 4.0 - 10.5 K/uL 6.9 10.4 14.8(H)  Hemoglobin 13.0 - 17.0 g/dL 12.4(L) 11.1(L) 12.1(L)  Hematocrit 39.0 - 52.0 % 36.8(L) 32.5(L) 37.0(L)  Platelets 150.0 - 400.0 K/uL 251.0 251 272    CXR:  No new film  I have personally reviewed all chest radiographs reported above including CXRs and CT chest unless otherwise indicated  IMPRESSION:     ICD-10-CM   1. Unexplained exertional dyspnea.  Normal or nearly normal PFTs and echocardiogram R06.09   2. Chronic rhinitis with posterior nasal drainage J31.0     PLAN:  We discussed results of the above tests. I offered that we should be encouraged that he appears to have normal cardiopulmonary function. He has an appt with Dr End in a couple of weeks and I will be interested in seeing what comes of that. Perhaps a Myoview is indicated to ensure that dyspnea is not an "anginal equivalent".   Flonase Rx updated and I encouraged him to resume its use  Follow up in 3 months. Call sooner as needed  Cameron Border, MD PCCM service Mobile [phone] Pager [phone] 04/05/2018 5:11 PM

## 2018-04-18 DIAGNOSIS — I639 Cerebral infarction, unspecified: Secondary | ICD-10-CM

## 2018-04-18 HISTORY — DX: Cerebral infarction, unspecified: I63.9

## 2018-04-19 ENCOUNTER — Ambulatory Visit (INDEPENDENT_AMBULATORY_CARE_PROVIDER_SITE_OTHER): Payer: Medicare Other | Admitting: Family Medicine

## 2018-04-19 ENCOUNTER — Encounter: Payer: Self-pay | Admitting: Family Medicine

## 2018-04-19 ENCOUNTER — Ambulatory Visit: Payer: Medicare Other | Admitting: Gastroenterology

## 2018-04-19 VITALS — BP 144/86 | HR 82 | Temp 98.2°F | Ht 69.0 in | Wt 216.8 lb

## 2018-04-19 DIAGNOSIS — R05 Cough: Secondary | ICD-10-CM

## 2018-04-19 DIAGNOSIS — R0982 Postnasal drip: Secondary | ICD-10-CM | POA: Diagnosis not present

## 2018-04-19 DIAGNOSIS — R059 Cough, unspecified: Secondary | ICD-10-CM

## 2018-04-19 MED ORDER — GUAIFENESIN ER 600 MG PO TB12: 600.0000 mg | ORAL_TABLET | Freq: Two times a day (BID) | ORAL | 0 refills | Status: DC

## 2018-04-19 MED ORDER — GUAIFENESIN-CODEINE 100-10 MG/5ML PO SOLN: 5.0000 mL | Freq: Four times a day (QID) | ORAL | 0 refills | Status: DC | PRN

## 2018-04-19 NOTE — Patient Instructions (Signed)
If the robitussin with codeine and/or mucinex is not covered by insurance -- a good Over The Counter option is Delsym syrup

## 2018-04-19 NOTE — Progress Notes (Signed)
Subjective:    Patient ID: Cameron Perna., male    DOB: 08/27/1940, 78 y.o.   MRN: [id number]  HPI   Presents to clinic c/o cough and trouble sleeping at night due to cough for 3 days.  Denies any phlegm production with cough, states cough mainly is dry, does sound wet sometimes in the evening.  Denies any fever or chills.  Denies any shortness of breath or wheezing.  Does have some post nasal drip, but uses Flonase to help this.   Saw pulmonology on 04/05/18, note reviewed by me,  and PFTs results were good and did not reveal any obvious cardiopulmonary issues. He will be seeing cardiology next week.   Patient Active Problem List   Diagnosis Date Noted  . Leg pain 03/05/2018  . Penile pain 04/19/2017  . History of colostomy reversal 03/21/2017  . Status post partial colectomy 01/03/2017  . Depression, major, single episode, mild ([place]) 01/03/2017  . Diverticulitis 12/05/2016  . History of Moh's micrographic surgery for skin cancer 09/07/2016  . Vasovagal syncope 01/27/2016  . Actinic keratoses 10/28/2015  . Osteoarthritis of right knee 10/01/2015  . Essential hypertension 08/25/2015  . Chronic abdominal pain 08/25/2015  . Anxiety 08/25/2015  . Exertional dyspnea 08/12/2015  . Gross hematuria 08/04/2015  . Erectile dysfunction following radiation therapy 08/04/2015  . Urinary retention 08/03/2015  . Myalgia 08/02/2015  . Constipation 08/02/2015  . History of prostate cancer 01/20/2015  . Skin nodule 01/20/2015  . Swelling of right lower extremity 01/20/2015   Social History   Tobacco Use  . Smoking status: Former Smoker    Years: 27.00    Last attempt to quit: 04/18/1988    Years since quitting: 30.0  . Smokeless tobacco: Former Systems developer    Types: Chew  Substance Use Topics  . Alcohol use: Yes    Alcohol/week: 4.0 standard drinks    Types: 1 Glasses of wine, 3 Standard drinks or equivalent per week    Comment: Occasional- Former Heavy ETOH Use   Review of  Systems  Constitutional: Negative for chills, fatigue and fever.  HENT: Negative for  ear pain, sinus pain and sore throat. Some runny nose, nasal congestion.  Eyes: Negative.   Respiratory: positive cough. Negative for shortness of breath and wheezing.   Cardiovascular: Negative for chest pain, palpitations and leg swelling.  Gastrointestinal: Negative for abdominal pain, diarrhea, nausea and vomiting.  Genitourinary: Negative for dysuria, frequency and urgency.  Musculoskeletal: Negative for arthralgias and myalgias.  Skin: Negative for color change, pallor and rash.  Neurological: Negative for syncope, light-headedness and headaches.  Psychiatric/Behavioral: The patient is not nervous/anxious.       Objective:   Physical Exam  Constitutional: He appears well-developed and well-nourished. No distress.  HENT:  Head: Normocephalic and atraumatic. Nose/throat: Clear nasal drainage and post nasal drip.  Eyes: Conjunctivae and EOM are normal. No scleral icterus.  Neck: Normal range of motion. Neck supple. No tracheal deviation present.  Cardiovascular: Normal rate, regular rhythm and normal heart sounds.  Pulmonary/Chest: Effort normal and breath sounds normal. No respiratory distress. He has no wheezes. He has no rales.  Neurological: He is alert and oriented to person, place, and time.  Gait normal  Skin: Skin is warm and dry. He is not diaphoretic. No pallor.  Psychiatric: He has a normal mood and affect. His behavior is normal. Thought content normal.   Nursing note and vitals reviewed.    Vitals:   04/19/18 1611  BP: Marland Kitchen)  144/86  Pulse: 82  Temp: 98.2 F (36.8 C)  SpO2: 95%    Assessment & Plan:   Cough/postnasal drip - lungs are clear on exam and testing done at pulmonology is reassuring for patient's overall lung function.  I suspect that cough most likely is related to postnasal drip.  He will continue to use Flonase nasal spray and I have prescribed him Mucinex  tablets to use for cough during the day and a Robitussin with codeine cough syrup to use at bedtime so he can get some rest.  Patient advised that if the Robitussin with codeine syrup is not covered, good over-the-counter choice would be a Delsym syrup.  Patient advised that if his cough persists for longer than 2 weeks, he should return to clinic for a chest x-ray.  Patient will keep regular [facility] scheduled follow-up with PCP as planned.  He will return to clinic sooner if any issues arise.

## 2018-04-24 DIAGNOSIS — L570 Actinic keratosis: Secondary | ICD-10-CM | POA: Diagnosis not present

## 2018-04-24 DIAGNOSIS — L578 Other skin changes due to chronic exposure to nonionizing radiation: Secondary | ICD-10-CM | POA: Diagnosis not present

## 2018-04-24 DIAGNOSIS — D485 Neoplasm of uncertain behavior of skin: Secondary | ICD-10-CM | POA: Diagnosis not present

## 2018-04-24 DIAGNOSIS — C44329 Squamous cell carcinoma of skin of other parts of face: Secondary | ICD-10-CM | POA: Diagnosis not present

## 2018-05-02 ENCOUNTER — Ambulatory Visit (INDEPENDENT_AMBULATORY_CARE_PROVIDER_SITE_OTHER): Payer: Medicare Other | Admitting: Internal Medicine

## 2018-05-02 ENCOUNTER — Encounter: Payer: Self-pay | Admitting: Internal Medicine

## 2018-05-02 VITALS — BP 140/90 | HR 75 | Ht 69.0 in | Wt 214.0 lb

## 2018-05-02 DIAGNOSIS — R0609 Other forms of dyspnea: Secondary | ICD-10-CM | POA: Diagnosis not present

## 2018-05-02 DIAGNOSIS — I10 Essential (primary) hypertension: Secondary | ICD-10-CM

## 2018-05-02 DIAGNOSIS — Z8679 Personal history of other diseases of the circulatory system: Secondary | ICD-10-CM

## 2018-05-02 DIAGNOSIS — R0600 Dyspnea, unspecified: Secondary | ICD-10-CM

## 2018-05-02 MED ORDER — FUROSEMIDE 20 MG PO TABS: 20.0000 mg | ORAL_TABLET | Freq: Every day | ORAL | 3 refills | Status: DC

## 2018-05-02 MED ORDER — NEBIVOLOL HCL 10 MG PO TABS: 10.0000 mg | ORAL_TABLET | Freq: Every day | ORAL | 2 refills | Status: DC

## 2018-05-02 NOTE — Patient Instructions (Addendum)
Medication Instructions:  Your physician has recommended you make the following change in your medication:  1- STOP Metoprolol. 2- START Furosemide 20 mg by  Mouth once a day. 3- START Bystolic 10 mg by mouth once a day.  If you need a refill on your cardiac medications before your next appointment, please call your pharmacy.   Lab work: none If you have labs (blood work) drawn today and your tests are completely normal, you will receive your results only by: Marland Kitchen MyChart Message (if you have MyChart) OR . A paper copy in the mail If you have any lab test that is abnormal or we need to change your treatment, we will call you to review the results.  Testing/Procedures: none  Follow-Up: At [facility], you and your health needs are our priority.  As part of our continuing mission to provide you with exceptional heart care, we have created designated Provider Care Teams.  These Care Teams include your primary Cardiologist (physician) and Advanced Practice Providers (APPs -  Physician Assistants and Nurse Practitioners) who all work together to provide you with the care you need, when you need it. You will need a follow up appointment in 1 months.  Please call our office 2 months in advance to schedule this appointment.  You may see DR Harrell Gave END or one of the following Advanced Practice Providers on your designated Care Team:   Murray Hodgkins, NP Christell Faith, PA-C . Marrianne Mood, PA-C    Medication Samples have been provided to the patient.  Drug name: bystolic       Strength: 64RC        Qty: 4 bottles  LOT: [id number]  Exp.Date: 05/2019

## 2018-05-02 NOTE — Progress Notes (Signed)
Outpatient Cardiology Consultation Date: 05/02/2018  Primary Care Provider: Leone Haven, MD [address] [place] [address]  Chief Complaint: Shortness of breath  HPI:  Cameron Foster is a 78 y.o. year-old male with history of hypertension, hyperlipidemia, single episode of atrial fibrillation in 1993/07/08, prostate cancer, and alcohol abuse, who has been referred back to our practice by Dr. Caryl Bis for evaluation of chronic shortness of breath.  He was seen in Jul 09, 2015 in our office by Dr. Fletcher Anon for the same complaint (though Cameron Foster does not recall much about this visit).  Echocardiogram and myocardial perfusion stress test at that time were unrevealing.  Cameron Foster reports having right knee surgery as well as bowel problems requiring ostomy (subsequently taken down) over the last 2 years.  As a result of this, he was not very mobile and believes that some of his symptoms may be due to deconditioning and weight gain.  He reports that walking as little as 30 to 40 yards makes him feel very short of breath to the point where he seems to be panting.  He does not stop on account of the dyspnea.  He has not had any chest pain, palpitations, lightheadedness, and orthopnea.  He has chronic lower extremity edema on the right, which he attributes to a prior injury as well as more recent knee surgery.  He denies a history of DVT.  Several years ago while living in [place], Cameron Foster had a syncopal episode.  This was preceded by palpitations.  Work-up was reportedly unrevealing and diagnosis of vasovagal syncope made.  He states that heart catheterization in [place] several years ago was unrevealing.  --------------------------------------------------------------------------------------------------  Past Medical History:  Diagnosis Date  . Alcoholism ([place])   . Arthritis   . Atrial fibrillation ([place])    one episode  . Colon polyp   . Depression    Son died 07/08/14  . Diverticulitis     . Diverticulosis 30 years  . Dysrhythmia    At Fib 1995  . GERD (gastroesophageal reflux disease)   . Hyperlipidemia   . Hypertension   . Irritable bowel syndrome   . Prostate cancer ([place]) 07/08/2010   treated with radiation therapy. Prostate  . Vasovagal syncope    Past Surgical History:  Procedure Laterality Date  . Virgie no stents  . CATARACT EXTRACTION, BILATERAL    . COLON RESECTION SIGMOID N/A 12/07/2016   Procedure: COLON RESECTION SIGMOID;  Surgeon: Clayburn Pert, MD;  Location: [facility] ORS;  Service: General;  Laterality: N/A;  . COLON SURGERY  11/2016   Colostomy  . COLONOSCOPY  08-Jul-2014  . COLONOSCOPY WITH PROPOFOL N/A 05/30/2016   Procedure: COLONOSCOPY WITH PROPOFOL;  Surgeon: Lollie Sails, MD;  Location: [facility] ENDOSCOPY;  Service: Endoscopy;  Laterality: N/A;  . COLOSTOMY Left 12/07/2016   Procedure: COLOSTOMY;  Surgeon: Clayburn Pert, MD;  Location: [facility] ORS;  Service: General;  Laterality: Left;  . COLOSTOMY REVERSAL N/A 03/21/2017   Procedure: COLOSTOMY REVERSAL;  Surgeon: Clayburn Pert, MD;  Location: [facility] ORS;  Service: General;  Laterality: N/A;  . COLOSTOMY TAKEDOWN N/A 03/21/2017   Procedure: LAPAROSCOPIC COLOSTOMY TAKEDOWN;  Surgeon: Clayburn Pert, MD;  Location: [facility] ORS;  Service: General;  Laterality: N/A;  . CYSTOSCOPY WITH STENT PLACEMENT Bilateral 03/21/2017   Procedure: CYSTOSCOPY WITH LIGHTED STENT PLACEMENT;  Surgeon: Abbie Sons, MD;  Location: [facility] ORS;  Service: Urology;  Laterality: Bilateral;  . ESOPHAGOGASTRODUODENOSCOPY    .  ESOPHAGOGASTRODUODENOSCOPY N/A 05/30/2016   Procedure: ESOPHAGOGASTRODUODENOSCOPY (EGD);  Surgeon: Lollie Sails, MD;  Location: [facility] ENDOSCOPY;  Service: Endoscopy;  Laterality: N/A;  . EYE SURGERY     cataracts  . FLEXIBLE SIGMOIDOSCOPY N/A 03/21/2017   Procedure: FLEXIBLE SIGMOIDOSCOPY;  Surgeon: Clayburn Pert, MD;  Location: [facility] ORS;  Service: General;  Laterality:  N/A;  . FRACTURE SURGERY Bilateral    right arm and left wrist  . INCISION AND DRAINAGE ABSCESS N/A 12/07/2016   Procedure: DRAINAGE  OF INTRA ABDOMINAL ABSCESS;  Surgeon: Clayburn Pert, MD;  Location: [facility] ORS;  Service: General;  Laterality: N/A;  . KNEE ARTHROSCOPY    . LAPAROTOMY N/A 12/07/2016   Procedure: EXPLORATORY LAPAROTOMY;  Surgeon: Clayburn Pert, MD;  Location: [facility] ORS;  Service: General;  Laterality: N/A;  . PROSTATE SURGERY     Microwave therapy  . REPLACEMENT TOTAL KNEE Right 07/2016  . TONSILLECTOMY    . TOTAL KNEE ARTHROPLASTY Right 07/27/2016   Procedure: TOTAL KNEE ARTHROPLASTY;  Surgeon: [name], MD;  Location: [facility] ORS;  Service: Orthopedics;  Laterality: Right;  Dr. Erlene Quan had to place Urinary catheter due to prostate cancer history.  Using flexible scope.    Current Meds  Medication Sig  . amLODipine (NORVASC) 5 MG tablet TAKE 1 TABLET BY MOUTH EVERY DAY  . docusate sodium (STOOL SOFTENER) 100 MG capsule Take 100 mg by mouth 2 (two) times daily.  . fluticasone (FLONASE) 50 MCG/ACT nasal spray Place 1 spray into both nostrils daily as needed for allergies or rhinitis.  . hydrocortisone 2.5 % cream APPLY CREAM TO AFFECTED AREA TWICE DAILY AS NEEDED FOR RASH  . lisinopril (PRINIVIL,ZESTRIL) 10 MG tablet TAKE 1 TABLET BY MOUTH EVERY DAY  . liver oil-zinc oxide (DESITIN) 40 % ointment Apply 1 application topically daily as needed for irritation.  . meloxicam (MOBIC) 15 MG tablet Take 15 mg by mouth daily.   . metoprolol succinate (TOPROL-XL) 25 MG 24 hr tablet TAKE 1 TABLET BY MOUTH DAILY  . omeprazole (PRILOSEC) 20 MG capsule Take 20 mg by mouth daily as needed (heartburn).   . sertraline (ZOLOFT) 100 MG tablet TAKE 1 TABLET BY MOUTH EVERY DAY    Allergies: Augmentin [amoxicillin-pot clavulanate]; Formaldehyde; and Tape  Social History   Tobacco Use  . Smoking status: Former Smoker    Years: 27.00    Last attempt to quit: 04/18/1988    Years since  quitting: 30.0  . Smokeless tobacco: Former Systems developer    Types: Chew  Substance Use Topics  . Alcohol use: Yes    Alcohol/week: 4.0 standard drinks    Types: 1 Glasses of wine, 3 Standard drinks or equivalent per week    Comment: Occasional- Former Heavy ETOH Use  . Drug use: No    Family History  Problem Relation Age of Onset  . Lung cancer Father   . Other Mother   . Bipolar disorder Son   . Sudden death Son        due to drug over dose and cardiac issues  . Prostate cancer Neg Hx   . Bladder Cancer Neg Hx     Review of Systems: A 12-system review of systems was performed and was negative except as noted in the HPI.  --------------------------------------------------------------------------------------------------  Physical Exam: BP 140/90 (BP Location: Right Arm, Patient Position: Sitting, Cuff Size: Normal)   Pulse 75   Ht 5' 9"  (1.753 m)   Wt 214 lb (97.1 kg)   BMI 31.60 kg/m  General: NAD. HEENT: No conjunctival pallor or scleral icterus. Moist mucous membranes.  OP clear. Neck: Supple without lymphadenopathy, thyromegaly, JVD, or HJR. No carotid bruit. Lungs: Normal work of breathing.  Mildly diminished breath sounds throughout without wheezes or crackles. Heart: Regular rate and rhythm without murmurs, rubs, or gallops. Non-displaced PMI. Abd: Bowel sounds present. Soft, NT/ND without hepatosplenomegaly Ext: Trace bilateral lower extremity edema.. Radial, PT, and DP pulses are 2+ bilaterally. Skin: Warm and dry without rash.  EKG: Normal sinus rhythm with PACs, LAFB, LVH, and nonspecific T wave abnormality.  No significant change from prior tracing on 03/28/2017.  Lab Results  Component Value Date   WBC 6.9 02/21/2018   HGB 12.4 (L) 02/21/2018   HCT 36.8 (L) 02/21/2018   MCV 85.3 02/21/2018   PLT 251.0 02/21/2018    Lab Results  Component Value Date   NA 137 02/21/2018   K 3.9 02/21/2018   CL 104 02/21/2018   CO2 27 02/21/2018   BUN 13 02/21/2018    CREATININE 1.28 02/21/2018   GLUCOSE 92 02/21/2018   ALT 8 02/21/2018    No results found for: CHOL, HDL, LDLCALC, LDLDIRECT, TRIG, CHOLHDL  --------------------------------------------------------------------------------------------------  ASSESSMENT AND PLAN: Dyspnea on exertion This has been a chronic issue dating back many years, though Cameron Foster feels like it has been worse over the last year.  He has been immobilized for large portion of this time due to knee and abdominal surgeries.  He has also put on quite a bit of weight per his report.  Work-up thus far, including PFTs and echocardiogram last month were unrevealing other than trivial AI, mild MR, and questionable PFO.  I do not think that these echo findings explain his symptoms.  Pharmacologic myocardial perfusion stress test for dyspnea in 2017 was also normal.  We have discussed repeating ischemia evaluation but have agreed to defer this.  I wonder if deconditioning and medication side effects could be contributing.  We have agreed to switch metoprolol to nebivolol 10 mg daily as well as start furosemide 20 mg daily for potential component of HFpEF.  If symptoms persist despite these interventions, we may ultimately need to consider left and right heart catheterization to definitively define his coronary anatomy and filling pressures.  Hypertension Blood pressure mildly elevated today.  As above, we have agreed to switch metoprolol to nebivolol 10 mg daily.  I will also start furosemide, which may help some with blood pressure as well.  History of atrial fibrillation Details are unclear other than 1 episode reportedly occurred in 1995.  Cameron Foster has not been on anticoagulation.  He denies palpitations.  EKG today demonstrates normal sinus rhythm.  We will defer further work-up for now.  Follow-up: Return to clinic in 1 month.  [name], MD 05/02/2018 3:24 PM

## 2018-05-03 ENCOUNTER — Encounter: Payer: Self-pay | Admitting: Internal Medicine

## 2018-05-04 ENCOUNTER — Telehealth: Payer: Self-pay | Admitting: Family Medicine

## 2018-05-04 NOTE — Telephone Encounter (Signed)
LMTCB on home and cell number. Patient can be seen on 05/14/2018 at 10:00 arrive at 9:45 am. Already scheduled so appointment slot would not get filled. Left message for patient to call back to let us know if he is able to come that day.

## 2018-05-04 NOTE — Telephone Encounter (Signed)
This pt has been rescheduled already and needing his New pt appt  Rescheduled close to his original date of 2 -5-20 as possible.  Asking if Dr B could work him in?  Thanks, [employer]

## 2018-05-06 ENCOUNTER — Other Ambulatory Visit: Payer: Self-pay | Admitting: Family Medicine

## 2018-05-09 NOTE — Telephone Encounter (Signed)
Letter send out to patient.

## 2018-05-10 ENCOUNTER — Telehealth: Payer: Self-pay | Admitting: Family Medicine

## 2018-05-10 NOTE — Telephone Encounter (Signed)
Patient advised.

## 2018-05-10 NOTE — Telephone Encounter (Signed)
Pt wanting to know why Dr. B sent a letter to his previous PCP to ask why pt is leaving his PCP and seeking her as his new PCP. He states he is upset this was done.  Pt wanting a call back to discuss asap.  Thanks, [employer]

## 2018-05-10 NOTE — Telephone Encounter (Signed)
We did not send anything to his previous PCP.  Dr Josephina Gip and I are in the same system and can see appointment and notes from both offices.  He can see in the computer system that a new patient appointment is scheduled here.

## 2018-05-14 ENCOUNTER — Encounter: Payer: Self-pay | Admitting: Family Medicine

## 2018-05-14 ENCOUNTER — Ambulatory Visit (INDEPENDENT_AMBULATORY_CARE_PROVIDER_SITE_OTHER): Payer: Medicare Other | Admitting: Family Medicine

## 2018-05-14 VITALS — BP 155/85 | HR 89 | Temp 98.2°F | Ht 69.0 in | Wt 215.8 lb

## 2018-05-14 DIAGNOSIS — I10 Essential (primary) hypertension: Secondary | ICD-10-CM

## 2018-05-14 DIAGNOSIS — Z8546 Personal history of malignant neoplasm of prostate: Secondary | ICD-10-CM

## 2018-05-14 DIAGNOSIS — E782 Mixed hyperlipidemia: Secondary | ICD-10-CM

## 2018-05-14 DIAGNOSIS — Z23 Encounter for immunization: Secondary | ICD-10-CM | POA: Diagnosis not present

## 2018-05-14 DIAGNOSIS — R55 Syncope and collapse: Secondary | ICD-10-CM | POA: Diagnosis not present

## 2018-05-14 DIAGNOSIS — Z85828 Personal history of other malignant neoplasm of skin: Secondary | ICD-10-CM | POA: Diagnosis not present

## 2018-05-14 DIAGNOSIS — E785 Hyperlipidemia, unspecified: Secondary | ICD-10-CM | POA: Insufficient documentation

## 2018-05-14 DIAGNOSIS — M1611 Unilateral primary osteoarthritis, right hip: Secondary | ICD-10-CM | POA: Diagnosis not present

## 2018-05-14 DIAGNOSIS — R0609 Other forms of dyspnea: Secondary | ICD-10-CM | POA: Diagnosis not present

## 2018-05-14 DIAGNOSIS — R0600 Dyspnea, unspecified: Secondary | ICD-10-CM

## 2018-05-14 DIAGNOSIS — E669 Obesity, unspecified: Secondary | ICD-10-CM | POA: Insufficient documentation

## 2018-05-14 DIAGNOSIS — M1711 Unilateral primary osteoarthritis, right knee: Secondary | ICD-10-CM | POA: Diagnosis not present

## 2018-05-14 DIAGNOSIS — M5136 Other intervertebral disc degeneration, lumbar region: Secondary | ICD-10-CM | POA: Diagnosis not present

## 2018-05-14 DIAGNOSIS — M7061 Trochanteric bursitis, right hip: Secondary | ICD-10-CM

## 2018-05-14 DIAGNOSIS — M7989 Other specified soft tissue disorders: Secondary | ICD-10-CM | POA: Diagnosis not present

## 2018-05-14 DIAGNOSIS — Z9049 Acquired absence of other specified parts of digestive tract: Secondary | ICD-10-CM

## 2018-05-14 DIAGNOSIS — K5900 Constipation, unspecified: Secondary | ICD-10-CM

## 2018-05-14 DIAGNOSIS — Z6831 Body mass index (BMI) 31.0-31.9, adult: Secondary | ICD-10-CM

## 2018-05-14 DIAGNOSIS — F320 Major depressive disorder, single episode, mild: Secondary | ICD-10-CM

## 2018-05-14 NOTE — Assessment & Plan Note (Signed)
Followed by orthopedics Status post right knee replacement

## 2018-05-14 NOTE — Assessment & Plan Note (Signed)
Now significantly improved status post corticosteroid injection

## 2018-05-14 NOTE — Assessment & Plan Note (Signed)
Chronic right lower extremity edema that is unilateral He was previously evaluated by vascular surgery He also underwent ultrasound to rule out DVT Discussed compression stockings Also currently on Lasix, but doubt it will help with his chronic problem

## 2018-05-14 NOTE — Assessment & Plan Note (Signed)
Urinating well at this time Continue regular follow-up with urology

## 2018-05-14 NOTE — Progress Notes (Signed)
Patient: Cameron Cayer., Male    DOB: 09-19-1940, 78 y.o.   MRN: [id number] Visit Date: 05/14/2018  Today's Provider: Lavon Paganini, MD   Chief Complaint  Patient presents with  . Establish Care   Subjective:  I, Cameron Foster, CMA, am acting as a scribe for Lavon Paganini, MD.    New Patient:  Cameron Speegle. is a 78 y.o. male who presents today to Establish Care. He was previously a patient at [facility] in [place]  He feels well. He reports exercising none. He reports he is sleeping well.  GERD, IBS, h/o diverticulitis, h/o colon polyps: H/o ruptured diverticulitis requiring laparotomy, partial colon resection and colostomy placement.  This occurred in 11/2016.  He was followed until 03/2017 when he underwent colostomy takedown and reanastomosis.  He states he has been doing well since that time.  He is followed regularly by gastroenterology, Dr. Bonna Gains.  HTN: - Medications: Amlodipine 58m daily, lisinopril 135mdaily, lasix 2010XNaily, Bystolic 10 mg daily - Compliance: Good - Checking BP at home: No - Denies any SOB, CP, vision changes, LE edema, medication SEs, or symptoms of hypotension - does have a h/o vasovagal syncope  HLD - medications: None -No previous lipid panel available for review  Patient has been undergoing work-up over the last year or so for dyspnea on exertion.  He states this only occurs with walking.  He states he can walk 30 yards or so before he becomes short of breath.  He states that he used to be very physically active.  He does admit that he did not do any physical activity during 2018 while he was undergoing multiple surgeries and knee replacement.  He has had extensive work-up by cardiology and pulmonology, including PFTs and echocardiogram in 03/2018 that were grossly unrevealing other than trivial AI, mild MR, a questionable PFO.  He had pharmacologic myocardial perfusion stress test for dyspnea in 2017 that was normal.  [facility]  Earlier this month he [facility] for potential component of HFpEF.  It is been noted by cardiology that if symptoms persist despite these interventions, they may need to consider left and right heart catheterization to definitively define his coronary anatomy and filling pressures.  The patient is wondering if his shortness of breath may be related to pain with walking due to SI dysfunction.  He is going to discuss his SI dysfunction with his orthopedist  Patient has a history of atrial fibrillation as well.  He reports 1 episode that occurred in 1995 while hospitalized in the setting of electrolyte imbalance.  He states he underwent a cardiac catheterization that was normal at that time.  He has been in normal sinus rhythm every time that he has been seen lately.  He is not on anticoagulation.  He denies palpitations.  History of prostate cancer: Diagnosed in 2012 and treated with radiation therapy.  [facility]  Patient is bothered by his abdominal obesity.  He states he can even eat 1 meal a day and he has not been losing weight.  He does admit that he is not exercising regularly.  He states that he used to play football and being very good shape and is disappointed by his appearance currently  Patient has OA of knees, hips, L-spine.  He is followed regularly by orthopedics.  He is status  post right knee replacement  Patient has a history of melanoma status post Mohs surgery.  He is seeing Dr. Nehemiah Massed for dermatology annually for skin checks.  He denies any suspicious lesions currently  Patient has a history of alcoholism now in remission.  He states he does still drink alcohol occasionally, but he previously was drinking to excess  Patient's son died suddenly in 2014-07-02 leading to an episode of depression.  He has been on Zoloft 100 mg daily since that time.  He  states that this controls his mood well and he denies any depressive symptoms currently. -----------------------------------------------------------------  Review of Systems  Constitutional: Positive for activity change and fatigue.  HENT: Positive for congestion, dental problem, nosebleeds, postnasal drip, sinus pressure, sneezing and tinnitus.   Eyes: Negative.   Respiratory: Positive for shortness of breath.   Cardiovascular: Positive for leg swelling.  Gastrointestinal: Positive for abdominal distention and abdominal pain.  Endocrine: Positive for polyuria.  Genitourinary: Negative.   Musculoskeletal: Positive for arthralgias, back pain, gait problem and joint swelling.  Skin: Negative.   Neurological: Positive for dizziness, syncope and light-headedness.  Hematological: Negative.   Psychiatric/Behavioral: Negative.     Social History      He  reports that he quit smoking about 30 years ago. His smoking use included cigarettes. He has a 27.00 pack-year smoking history. He has quit using smokeless tobacco.  His smokeless tobacco use included chew. He reports current alcohol use of about 3.0 standard drinks of alcohol per week. He reports that he does not use drugs.       Social History   Socioeconomic History  . Marital status: Married    Spouse name: Not on file  . Number of children: 2  . Years of education: Not on file  . Highest education level: Not on file  Occupational History  . Occupation: retired Dance movement psychotherapist  Social Needs  . Financial resource strain: Not on file  . Food insecurity:    Worry: Not on file    Inability: Not on file  . Transportation needs:    Medical: Not on file    Non-medical: Not on file  Tobacco Use  . Smoking status: Former Smoker    Packs/day: 1.00    Years: 27.00    Pack years: 27.00    Types: Cigarettes    Last attempt to quit: 04/18/1988    Years since quitting: 30.0  . Smokeless tobacco: Former Systems developer    Types: Chew  Substance and  Sexual Activity  . Alcohol use: Yes    Alcohol/week: 3.0 standard drinks    Types: 3 Standard drinks or equivalent per week    Comment: Occasional- Former Heavy ETOH Use  . Drug use: No  . Sexual activity: Not Currently  Lifestyle  . Physical activity:    Days per week: Not on file    Minutes per session: Not on file  . Stress: Not on file  Relationships  . Social connections:    Talks on phone: Not on file    Gets together: Not on file    Attends religious service: Not on file    Active member of club or organization: Not on file    Attends meetings of clubs or organizations: Not on file    Relationship status: Not on file  Other Topics Concern  . Not on file  Social History Narrative  . Not on file    Past Medical History:  Diagnosis Date  . Alcoholism ([place])   .  Arthritis   . Atrial fibrillation ([place])    one episode  . Colon polyp   . Depression    Son died 2014-06-23  . Diverticulitis   . Diverticulosis 30 years  . Dysrhythmia    At Fib 1995  . GERD (gastroesophageal reflux disease)   . Hyperlipidemia   . Hypertension   . Irritable bowel syndrome   . Prostate cancer ([place]) 06-23-10   treated with radiation therapy. Prostate  . Vasovagal syncope      Patient Active Problem List   Diagnosis Date Noted  . Class 1 obesity without serious comorbidity with body mass index (BMI) of 31.0 to 31.9 in adult 05/14/2018  . Mixed hyperlipidemia 05/14/2018  . Degeneration of lumbar intervertebral disc 03/22/2018  . Osteoarthritis of hip 03/22/2018  . Trochanteric bursitis of right hip 03/22/2018  . Status post partial colectomy 01/03/2017  . Depression, major, single episode, mild ([place]) 01/03/2017  . Vasovagal syncope 01/27/2016  . History of skin cancer 10/28/2015  . Osteoarthritis of right knee 10/01/2015  . Essential hypertension 08/25/2015  . Dyspnea on exertion 08/12/2015  . Erectile dysfunction following radiation therapy 08/04/2015  . Urinary retention 08/03/2015  .  Constipation 08/02/2015  . History of prostate cancer 01/20/2015  . Swelling of right lower extremity 01/20/2015    Past Surgical History:  Procedure Laterality Date  . [place] no stents  . CATARACT EXTRACTION, BILATERAL    . COLON RESECTION SIGMOID N/A 12/07/2016   Procedure: COLON RESECTION SIGMOID;  Surgeon: Clayburn Pert, MD;  Location: [facility] ORS;  Service: General;  Laterality: N/A;  . COLON SURGERY  11/2016   Colostomy  . COLONOSCOPY  06/23/14  . COLONOSCOPY WITH PROPOFOL N/A 05/30/2016   Procedure: COLONOSCOPY WITH PROPOFOL;  Surgeon: Lollie Sails, MD;  Location: [facility] ENDOSCOPY;  Service: Endoscopy;  Laterality: N/A;  . COLOSTOMY Left 12/07/2016   Procedure: COLOSTOMY;  Surgeon: Clayburn Pert, MD;  Location: [facility] ORS;  Service: General;  Laterality: Left;  . COLOSTOMY REVERSAL N/A 03/21/2017   Procedure: COLOSTOMY REVERSAL;  Surgeon: Clayburn Pert, MD;  Location: [facility] ORS;  Service: General;  Laterality: N/A;  . COLOSTOMY TAKEDOWN N/A 03/21/2017   Procedure: LAPAROSCOPIC COLOSTOMY TAKEDOWN;  Surgeon: Clayburn Pert, MD;  Location: [facility] ORS;  Service: General;  Laterality: N/A;  . CYSTOSCOPY WITH STENT PLACEMENT Bilateral 03/21/2017   Procedure: CYSTOSCOPY WITH LIGHTED STENT PLACEMENT;  Surgeon: Abbie Sons, MD;  Location: [facility] ORS;  Service: Urology;  Laterality: Bilateral;  . ESOPHAGOGASTRODUODENOSCOPY    . ESOPHAGOGASTRODUODENOSCOPY N/A 05/30/2016   Procedure: ESOPHAGOGASTRODUODENOSCOPY (EGD);  Surgeon: Lollie Sails, MD;  Location: [facility] ENDOSCOPY;  Service: Endoscopy;  Laterality: N/A;  . EYE SURGERY     cataracts  . FLEXIBLE SIGMOIDOSCOPY N/A 03/21/2017   Procedure: FLEXIBLE SIGMOIDOSCOPY;  Surgeon: Clayburn Pert, MD;  Location: [facility] ORS;  Service: General;  Laterality: N/A;  . FRACTURE SURGERY Bilateral    right arm and left wrist  . INCISION AND DRAINAGE ABSCESS N/A 12/07/2016   Procedure: DRAINAGE  OF INTRA ABDOMINAL  ABSCESS;  Surgeon: Clayburn Pert, MD;  Location: [facility] ORS;  Service: General;  Laterality: N/A;  . KNEE ARTHROSCOPY    . LAPAROTOMY N/A 12/07/2016   Procedure: EXPLORATORY LAPAROTOMY;  Surgeon: Clayburn Pert, MD;  Location: [facility] ORS;  Service: General;  Laterality: N/A;  . PROSTATE SURGERY     Microwave therapy  . TONSILLECTOMY    . TOTAL KNEE ARTHROPLASTY Right 07/27/2016   Procedure: TOTAL KNEE  ARTHROPLASTY;  Surgeon: [name], MD;  Location: [facility] ORS;  Service: Orthopedics;  Laterality: Right;  Dr. Erlene Quan had to place Urinary catheter due to prostate cancer history.  Using flexible scope.    Family History        Family Status  Relation Name Status  . Father  Deceased  . Mother  Deceased  . Son  Deceased  . Daughter  (Not Specified)  . Neg Hx  (Not Specified)        His family history includes Bipolar disorder in his son; Kidney disease in his daughter; Lung cancer in his father; Other in his mother; Sudden death in his son. There is no history of Prostate cancer or Bladder Cancer.      Allergies  Allergen Reactions  . Augmentin [Amoxicillin-Pot Clavulanate] Other (See Comments)    Abdominal upset Has patient had a PCN reaction causing immediate rash, facial/tongue/throat swelling, SOB or lightheadedness with hypotension: Unknown Has patient had a PCN reaction causing severe rash involving mucus membranes or skin necrosis: Unknown Has patient had a PCN reaction that required hospitalization: Unknown Has patient had a PCN reaction occurring within the last 10 years: Unknown If all of the above answers are "NO", then may proceed with Cephalosporin use.   . Formaldehyde Rash  . Tape Rash    Pt states he is not allergic to tape.     Current Outpatient Medications:  .  amLODipine (NORVASC) 5 MG tablet, TAKE 1 TABLET BY MOUTH EVERY DAY, Disp: 90 tablet, Rfl: 2 .  docusate sodium (STOOL SOFTENER) 100 MG capsule, Take 100 mg by mouth 2 (two) times daily., Disp: , Rfl:    .  fluticasone (FLONASE) 50 MCG/ACT nasal spray, Place 1 spray into both nostrils daily as needed for allergies or rhinitis., Disp: , Rfl:  .  furosemide (LASIX) 20 MG tablet, Take 1 tablet (20 mg total) by mouth daily., Disp: 90 tablet, Rfl: 3 .  hydrocortisone 2.5 % cream, APPLY CREAM TO AFFECTED AREA TWICE DAILY AS NEEDED FOR RASH, Disp: 28.35 g, Rfl: 0 .  lisinopril (PRINIVIL,ZESTRIL) 10 MG tablet, TAKE 1 TABLET BY MOUTH EVERY DAY, Disp: 90 tablet, Rfl: 1 .  liver oil-zinc oxide (DESITIN) 40 % ointment, Apply 1 application topically daily as needed for irritation., Disp: , Rfl:  .  meloxicam (MOBIC) 15 MG tablet, Take 15 mg by mouth daily. , Disp: , Rfl:  .  nebivolol (BYSTOLIC) 10 MG tablet, Take 1 tablet (10 mg total) by mouth daily., Disp: 90 tablet, Rfl: 2 .  omeprazole (PRILOSEC) 20 MG capsule, Take 20 mg by mouth daily as needed (heartburn). , Disp: , Rfl:  .  sertraline (ZOLOFT) 100 MG tablet, TAKE 1 TABLET BY MOUTH EVERY DAY, Disp: 90 tablet, Rfl: 0   Patient Care Team: Ramsay Bognar, Dionne Bucy, MD as PCP - General (Family Medicine) Leone Haven, MD as Consulting Physician (Family Medicine) Bary Castilla, Forest Gleason, MD (General Surgery) Wellington Hampshire, MD as Consulting Physician (Cardiology)      Objective:   Vitals: BP (!) 155/85 (BP Location: Right Arm, Patient Position: Sitting, Cuff Size: Large)   Pulse 89   Temp 98.2 F (36.8 C) (Oral)   Ht 5' 9"  (1.753 m)   Wt 215 lb 12.8 oz (97.9 kg)   SpO2 96%   BMI 31.87 kg/m    Vitals:   05/14/18 1002  BP: (!) 155/85  Pulse: 89  Temp: 98.2 F (36.8 C)  TempSrc: Oral  SpO2: 96%  Weight: 215 lb 12.8 oz (97.9 kg)  Height: 5' 9"  (1.753 m)     Physical Exam Vitals signs reviewed.  Constitutional:      General: He is not in acute distress.    Appearance: Normal appearance. He is well-developed. He is not diaphoretic.  HENT:     Head: Normocephalic and atraumatic.     Right Ear: Tympanic membrane, ear canal and external  ear normal.     Left Ear: Tympanic membrane, ear canal and external ear normal.     Nose: Nose normal. No congestion.     Mouth/Throat:     Mouth: Mucous membranes are moist.     Pharynx: Oropharynx is clear. No oropharyngeal exudate.  Eyes:     General: No scleral icterus.    Conjunctiva/sclera: Conjunctivae normal.     Pupils: Pupils are equal, round, and reactive to light.  Neck:     Musculoskeletal: Neck supple.     Thyroid: No thyromegaly.  Cardiovascular:     Rate and Rhythm: Normal rate and regular rhythm.     Pulses: Normal pulses.     Heart sounds: Normal heart sounds. No murmur.  Pulmonary:     Effort: Pulmonary effort is normal. No respiratory distress.     Breath sounds: Normal breath sounds. No wheezing or rales.  Abdominal:     General: Bowel sounds are normal. There is no distension.     Palpations: Abdomen is soft.     Tenderness: There is no abdominal tenderness. There is no guarding or rebound.  Musculoskeletal:        General: No deformity.     Right lower leg: Edema (1+) present.     Left lower leg: No edema.  Lymphadenopathy:     Cervical: No cervical adenopathy.  Skin:    General: Skin is warm and dry.     Capillary Refill: Capillary refill takes less than 2 seconds.     Findings: No rash.  Neurological:     Mental Status: He is alert and oriented to person, place, and time. Mental status is at baseline.  Psychiatric:        Mood and Affect: Mood normal.        Behavior: Behavior normal.        Thought Content: Thought content normal.      Depression Screen PHQ 2/9 Scores 05/14/2018 09/02/2016 06/24/2016 01/20/2015  PHQ - 2 Score 0 0 0 1  PHQ- 9 Score 0 0 - 4     Assessment & Plan:     Establish Care  Exercise Activities and Dietary recommendations Goals    .  Increase physical activity     Complete physical therapy exercises.  Continue proper range of motion at home.       Immunization History  Administered Date(s) Administered  .  Influenza, High Dose Seasonal PF 01/26/2017, 02/01/2018  . Influenza,inj,Quad PF,6+ Mos 02/08/2016  . Influenza,inj,quad, With Preservative 12/18/2014  . Pneumococcal Conjugate-13 05/14/2018  . Pneumococcal Polysaccharide-23 12/18/2014    Health Maintenance  Topic Date Due  . TETANUS/TDAP  05/14/2022 (Originally 09/01/1959)  . COLONOSCOPY  05/31/2019  . INFLUENZA VACCINE  Completed  . PNA vac Low Risk Adult  Completed     Discussed health benefits of physical activity, and encouraged him to engage in regular exercise appropriate for his age and condition.    --------------------------------------------------------------------  Problem List Items Addressed This Visit      Cardiovascular and Mediastinum   Essential hypertension - Primary  Uncontrolled currently He is undergoing several medication changes with cardiology and has a follow-up in a few weeks I will not change any medications today Reviewed recent metabolic panel      Vasovagal syncope    History of No recent episodes Recent cardiac work-up is reassuring We will monitor for any recurrence Given return precautions        Musculoskeletal and Integument   Osteoarthritis of right knee    Followed by orthopedics Status post right knee replacement      Degeneration of lumbar intervertebral disc    Followed by orthopedics Continue Mobic as needed      Osteoarthritis of hip    Followed by orthopedics Continue Mobic as needed      Trochanteric bursitis of right hip    Now significantly improved status post corticosteroid injection        Other   History of prostate cancer    Urinating well at this time Continue regular follow-up with urology      Swelling of right lower extremity    Chronic right lower extremity edema that is unilateral He was previously evaluated by vascular surgery He also underwent ultrasound to rule out DVT Discussed compression stockings Also currently on Lasix, but doubt  it will help with his chronic problem      Constipation    Continue Colace as needed      Dyspnea on exertion    Chronic in nature He has undergone extensive pulmonary and cardiac work-up He is followed by pulmonology and cardiology He is currently trying Lasix to rule out any component of diastolic heart failure He has follow-up with cardiology in a few weeks They are considering possible ischemic work-up including cardiac catheterization as well I suspect that deconditioning plays a large role in this as well and I encouraged patient to try to walk daily, knowing his limits Discussed return precautions      History of skin cancer    Patient has a history of melanoma on the face He is status post Mohs surgery He is followed annually by dermatology with regular skin checks      Status post partial colectomy    Patient with history of ruptured diverticulitis requiring laparotomy, partial colon resection, and colostomy placement He is status post colostomy takedown and reanastomosis He is doing well with normal bowel movements currently He is followed regularly by GI and undergoes every 3 year colonoscopies      Depression, major, single episode, mild (HCC)    Currently in remission Doing well on Zoloft 100 mg daily, which we will continue He is safe to himself with no SI or HI Discussed return precautions      Class 1 obesity without serious comorbidity with body mass index (BMI) of 31.0 to 31.9 in adult    Discussed importance of healthy weight management Discussed importance of exercise and weight management Continue to monitor      Mixed hyperlipidemia    Reported history of No lipid panel available for review Not currently on a statin We will check lipid panel fasting Reviewed recent metabolic panel      Relevant Orders   Lipid panel       Return in about 6 months (around 11/12/2018) for chronic disease f/u.   The entirety of the information documented  in the History of Present Illness, Review of Systems and Physical Exam were personally obtained by me. Portions of this information were initially documented by Cameron Foster, [place] and  reviewed by me for thoroughness and accuracy.    [name], MD, MPH [facility] 05/14/2018 11:17 AM

## 2018-05-14 NOTE — Patient Instructions (Signed)
Shortness of Breath, Adult  Shortness of breath is when a person has trouble breathing enough air or when a person feels like she or he is having trouble breathing in enough air. Shortness of breath could be a sign of a medical problem.  Follow these instructions at home:     Pay attention to any changes in your symptoms.   Do not use any products that contain nicotine or tobacco, such as cigarettes, e-cigarettes, and chewing tobacco.   Do not smoke. Smoking is a common cause of shortness of breath. If you need help quitting, ask your health care provider.   Avoid things that can irritate your airways, such as:  ? Mold.  ? Dust.  ? Air pollution.  ? Chemical fumes.  ? Things that can cause allergy symptoms (allergens), if you have allergies.   Keep your living space clean and free of mold and dust.   Rest as needed. Slowly return to your usual activities.   Take over-the-counter and prescription medicines only as told by your health care provider. This includes oxygen therapy and inhaled medicines.   Keep all follow-up visits as told by your health care provider. This is important.  Contact a health care provider if:   Your condition does not improve as soon as expected.   You have a hard time doing your normal activities, even after you rest.   You have new symptoms.  Get help right away if:   Your shortness of breath gets worse.   You have shortness of breath when you are resting.   You feel light-headed or you faint.   You have a cough that is not controlled with medicines.   You cough up blood.   You have pain with breathing.   You have pain in your chest, arms, shoulders, or abdomen.   You have a fever.   You cannot walk up stairs or exercise the way that you normally do.  These symptoms may represent a serious problem that is an emergency. Do not wait to see if the symptoms will go away. Get medical help right away. Call your local emergency services (911 in the [place] Do not drive yourself  to the hospital.  Summary   Shortness of breath is when a person has trouble breathing enough air. It can be a sign of a medical problem.   Avoid things that irritate your lungs, such as smoking, pollution, mold, and dust.   Pay attention to changes in your symptoms and contact your health care provider if you have a hard time completing daily activities because of shortness of breath.  This information is not intended to replace advice given to you by your health care provider. Make sure you discuss any questions you have with your health care provider.  Document Released: 12/28/2000 Document Revised: 09/04/2017 Document Reviewed: 09/04/2017  Elsevier Interactive Patient Education  2019 [employer]

## 2018-05-14 NOTE — Assessment & Plan Note (Signed)
Chronic in nature He has undergone extensive pulmonary and cardiac work-up He is followed by pulmonology and cardiology He is currently trying Lasix to rule out any component of diastolic heart failure He has follow-up with cardiology in a few weeks They are considering possible ischemic work-up including cardiac catheterization as well I suspect that deconditioning plays a large role in this as well and I encouraged patient to try to walk daily, knowing his limits Discussed return precautions

## 2018-05-14 NOTE — Assessment & Plan Note (Signed)
Reported history of No lipid panel available for review Not currently on a statin We will check lipid panel fasting Reviewed recent metabolic panel

## 2018-05-14 NOTE — Assessment & Plan Note (Signed)
Discussed importance of healthy weight management Discussed importance of exercise and weight management Continue to monitor

## 2018-05-14 NOTE — Assessment & Plan Note (Signed)
History of No recent episodes Recent cardiac work-up is reassuring We will monitor for any recurrence Given return precautions

## 2018-05-14 NOTE — Assessment & Plan Note (Signed)
Followed by orthopedics Continue Mobic as needed

## 2018-05-14 NOTE — Assessment & Plan Note (Signed)
Continue Colace as needed

## 2018-05-14 NOTE — Assessment & Plan Note (Signed)
Patient has a history of melanoma on the face He is status post Mohs surgery He is followed annually by dermatology with regular skin checks

## 2018-05-14 NOTE — Assessment & Plan Note (Signed)
Currently in remission Doing well on Zoloft 100 mg daily, which we will continue He is safe to himself with no SI or HI Discussed return precautions

## 2018-05-14 NOTE — Assessment & Plan Note (Signed)
Uncontrolled currently He is undergoing several medication changes with cardiology and has a follow-up in a few weeks I will not change any medications today Reviewed recent metabolic panel

## 2018-05-14 NOTE — Assessment & Plan Note (Signed)
Patient with history of ruptured diverticulitis requiring laparotomy, partial colon resection, and colostomy placement He is status post colostomy takedown and reanastomosis He is doing well with normal bowel movements currently He is followed regularly by GI and undergoes every 3 year colonoscopies

## 2018-05-17 ENCOUNTER — Ambulatory Visit: Payer: Medicare Other | Admitting: Family Medicine

## 2018-05-18 DIAGNOSIS — E782 Mixed hyperlipidemia: Secondary | ICD-10-CM | POA: Diagnosis not present

## 2018-05-19 LAB — LIPID PANEL
Chol/HDL Ratio: 5 ratio (ref 0.0–5.0)
Cholesterol, Total: 213 mg/dL — ABNORMAL HIGH (ref 100–199)
HDL: 43 mg/dL (ref >39)
LDL Calculated: 141 mg/dL — ABNORMAL HIGH (ref 0–99)
Triglycerides: 143 mg/dL (ref 0–149)
VLDL Cholesterol Cal: 29 mg/dL (ref 5–40)

## 2018-05-21 DIAGNOSIS — M25561 Pain in right knee: Secondary | ICD-10-CM | POA: Diagnosis not present

## 2018-05-21 DIAGNOSIS — M533 Sacrococcygeal disorders, not elsewhere classified: Secondary | ICD-10-CM | POA: Diagnosis not present

## 2018-05-23 ENCOUNTER — Encounter

## 2018-05-23 ENCOUNTER — Ambulatory Visit: Payer: Medicare Other | Admitting: Family Medicine

## 2018-05-28 ENCOUNTER — Telehealth: Payer: Self-pay

## 2018-05-28 ENCOUNTER — Encounter: Payer: Self-pay | Admitting: Family Medicine

## 2018-05-28 ENCOUNTER — Other Ambulatory Visit: Payer: Self-pay

## 2018-05-28 ENCOUNTER — Other Ambulatory Visit: Payer: Self-pay | Admitting: Family Medicine

## 2018-05-28 MED ORDER — HYDROCORTISONE 2.5 % EX CREA: TOPICAL_CREAM | CUTANEOUS | 11 refills | Status: DC

## 2018-05-28 NOTE — Telephone Encounter (Signed)
-----   Message from Trinna Post, [place] sent at 05/25/2018  4:26 PM EST ----- Patient's cholesterol and other risk factors show that he's at an increased risk of CVD. He may have some benefit from beginning a statin therapy but this should be risk benefit discussion between him and Dr. Jacinto Reap. Will forward for her review.

## 2018-05-28 NOTE — Telephone Encounter (Signed)
Patient was advised in another message. The other message stated that patient should discuss with his cardiology doctor about the benefits of taking a statin medication.

## 2018-05-28 NOTE — Telephone Encounter (Signed)
Patient was advised and states he will discuss the benefit of taking statin medication.

## 2018-05-28 NOTE — Telephone Encounter (Signed)
-----   Message from Trinna Post, [place] sent at 05/25/2018  4:27 PM EST ----- Of note, he has cardiology appointment on 06/04/2018 and I think he should discuss with them the benefit of taking statin considering his levels.

## 2018-05-30 MED ORDER — SERTRALINE HCL 100 MG PO TABS: 100.0000 mg | ORAL_TABLET | Freq: Every day | ORAL | 3 refills | Status: DC

## 2018-06-01 ENCOUNTER — Ambulatory Visit: Payer: PRIVATE HEALTH INSURANCE | Admitting: Internal Medicine

## 2018-06-01 NOTE — Progress Notes (Deleted)
Follow-up Outpatient Visit Date: 06/01/2018  Primary Care Provider: [name], MD [address] [place] [address]  Chief Complaint: ***  HPI:  Mr. Cameron Foster is a 78 y.o. year-old male with history of hypertension, hyperlipidemia, single episode of atrial fibrillation in 06-30-93, prostate cancer, and alcohol abuse, who presents for follow-up of shortness of breath.  I met him a month ago, at which time we agreed to start furosemide 20 mg daily for a component of HFpEF contributing to his dyspnea.  We also switched from metoprolol to nebivolol.  --------------------------------------------------------------------------------------------------  Past Medical History:  Diagnosis Date  . Alcoholism ([place])   . Arthritis   . Atrial fibrillation ([place])    one episode  . Colon polyp   . Depression    Son died 2014-06-30  . Diverticulitis   . Diverticulosis 30 years  . Dysrhythmia    At Fib 1995  . GERD (gastroesophageal reflux disease)   . Hyperlipidemia   . Hypertension   . Irritable bowel syndrome   . Prostate cancer ([place]) 06/30/2010   treated with radiation therapy. Prostate  . Vasovagal syncope    Past Surgical History:  Procedure Laterality Date  . [place] no stents  . CATARACT EXTRACTION, BILATERAL    . COLON RESECTION SIGMOID N/A 12/07/2016   Procedure: COLON RESECTION SIGMOID;  Surgeon: Clayburn Pert, MD;  Location: [facility] ORS;  Service: General;  Laterality: N/A;  . COLON SURGERY  11/2016   Colostomy  . COLONOSCOPY  06/30/14  . COLONOSCOPY WITH PROPOFOL N/A 05/30/2016   Procedure: COLONOSCOPY WITH PROPOFOL;  Surgeon: Lollie Sails, MD;  Location: [facility] ENDOSCOPY;  Service: Endoscopy;  Laterality: N/A;  . COLOSTOMY Left 12/07/2016   Procedure: COLOSTOMY;  Surgeon: Clayburn Pert, MD;  Location: [facility] ORS;  Service: General;  Laterality: Left;  . COLOSTOMY REVERSAL N/A 03/21/2017   Procedure: COLOSTOMY REVERSAL;  Surgeon:  Clayburn Pert, MD;  Location: [facility] ORS;  Service: General;  Laterality: N/A;  . COLOSTOMY TAKEDOWN N/A 03/21/2017   Procedure: LAPAROSCOPIC COLOSTOMY TAKEDOWN;  Surgeon: Clayburn Pert, MD;  Location: [facility] ORS;  Service: General;  Laterality: N/A;  . CYSTOSCOPY WITH STENT PLACEMENT Bilateral 03/21/2017   Procedure: CYSTOSCOPY WITH LIGHTED STENT PLACEMENT;  Surgeon: Abbie Sons, MD;  Location: [facility] ORS;  Service: Urology;  Laterality: Bilateral;  . ESOPHAGOGASTRODUODENOSCOPY    . ESOPHAGOGASTRODUODENOSCOPY N/A 05/30/2016   Procedure: ESOPHAGOGASTRODUODENOSCOPY (EGD);  Surgeon: Lollie Sails, MD;  Location: [facility] ENDOSCOPY;  Service: Endoscopy;  Laterality: N/A;  . EYE SURGERY     cataracts  . FLEXIBLE SIGMOIDOSCOPY N/A 03/21/2017   Procedure: FLEXIBLE SIGMOIDOSCOPY;  Surgeon: Clayburn Pert, MD;  Location: [facility] ORS;  Service: General;  Laterality: N/A;  . FRACTURE SURGERY Bilateral    right arm and left wrist  . INCISION AND DRAINAGE ABSCESS N/A 12/07/2016   Procedure: DRAINAGE  OF INTRA ABDOMINAL ABSCESS;  Surgeon: Clayburn Pert, MD;  Location: [facility] ORS;  Service: General;  Laterality: N/A;  . KNEE ARTHROSCOPY    . LAPAROTOMY N/A 12/07/2016   Procedure: EXPLORATORY LAPAROTOMY;  Surgeon: Clayburn Pert, MD;  Location: [facility] ORS;  Service: General;  Laterality: N/A;  . PROSTATE SURGERY     Microwave therapy  . TONSILLECTOMY    . TOTAL KNEE ARTHROPLASTY Right 07/27/2016   Procedure: TOTAL KNEE ARTHROPLASTY;  Surgeon: [name], MD;  Location: [facility] ORS;  Service: Orthopedics;  Laterality: Right;  Dr. Erlene Quan had to place Urinary catheter due to prostate  cancer history.  Using flexible scope.    No outpatient medications have been marked as taking for the 06/01/18 encounter (Appointment) with Lanier Millon, Harrell Gave, MD.    Allergies: Augmentin [amoxicillin-pot clavulanate]; Formaldehyde; and Tape  Social History   Tobacco Use  . Smoking status: Former Smoker    Packs/day: 1.00      Years: 27.00    Pack years: 27.00    Types: Cigarettes    Last attempt to quit: 04/18/1988    Years since quitting: 30.1  . Smokeless tobacco: Former Systems developer    Types: Chew  Substance Use Topics  . Alcohol use: Yes    Alcohol/week: 3.0 standard drinks    Types: 3 Standard drinks or equivalent per week    Comment: Occasional- Former Heavy ETOH Use  . Drug use: No    Family History  Problem Relation Age of Onset  . Lung cancer Father        smoker  . Other Mother   . Sudden death Son        due to "Blood clots"  . Bipolar disorder Son   . Kidney disease Daughter        congenital one small kidney  . Prostate cancer Neg Hx   . Bladder Cancer Neg Hx     Review of Systems: A 12-system review of systems was performed and was negative except as noted in the HPI.  --------------------------------------------------------------------------------------------------  Physical Exam: There were no vitals taken for this visit.  General:  *** HEENT: No conjunctival pallor or scleral icterus. Moist mucous membranes.  OP clear. Neck: Supple without lymphadenopathy, thyromegaly, JVD, or HJR. No carotid bruit. Lungs: Normal work of breathing. Clear to auscultation bilaterally without wheezes or crackles. Heart: Regular rate and rhythm without murmurs, rubs, or gallops. Non-displaced PMI. Abd: Bowel sounds present. Soft, NT/ND without hepatosplenomegaly Ext: No lower extremity edema. Radial, PT, and DP pulses are 2+ bilaterally. Skin: Warm and dry without rash.  EKG:  ***  Lab Results  Component Value Date   WBC 6.9 02/21/2018   HGB 12.4 (L) 02/21/2018   HCT 36.8 (L) 02/21/2018   MCV 85.3 02/21/2018   PLT 251.0 02/21/2018    Lab Results  Component Value Date   NA 137 02/21/2018   K 3.9 02/21/2018   CL 104 02/21/2018   CO2 27 02/21/2018   BUN 13 02/21/2018   CREATININE 1.28 02/21/2018   GLUCOSE 92 02/21/2018   ALT 8 02/21/2018    Lab Results  Component Value Date   CHOL  213 (H) 05/18/2018   HDL 43 05/18/2018   LDLCALC 141 (H) 05/18/2018   TRIG 143 05/18/2018   CHOLHDL 5.0 05/18/2018    --------------------------------------------------------------------------------------------------  ASSESSMENT AND PLAN: Harrell Gave Mardie Kellen, MD 06/01/2018 1:34 PM

## 2018-06-04 ENCOUNTER — Ambulatory Visit: Payer: PRIVATE HEALTH INSURANCE | Admitting: Physician Assistant

## 2018-06-04 ENCOUNTER — Encounter: Payer: Self-pay | Admitting: Internal Medicine

## 2018-06-04 NOTE — Telephone Encounter (Signed)
This encounter was created in error - please disregard.

## 2018-06-07 ENCOUNTER — Ambulatory Visit: Payer: PRIVATE HEALTH INSURANCE | Admitting: Internal Medicine

## 2018-06-08 DIAGNOSIS — R293 Abnormal posture: Secondary | ICD-10-CM | POA: Diagnosis not present

## 2018-06-08 DIAGNOSIS — M545 Low back pain: Secondary | ICD-10-CM | POA: Diagnosis not present

## 2018-06-18 DIAGNOSIS — M545 Low back pain: Secondary | ICD-10-CM | POA: Diagnosis not present

## 2018-06-18 DIAGNOSIS — R293 Abnormal posture: Secondary | ICD-10-CM | POA: Diagnosis not present

## 2018-06-25 ENCOUNTER — Ambulatory Visit: Payer: PRIVATE HEALTH INSURANCE | Admitting: Physician Assistant

## 2018-06-25 ENCOUNTER — Ambulatory Visit: Payer: PRIVATE HEALTH INSURANCE | Admitting: Internal Medicine

## 2018-06-25 NOTE — Progress Notes (Deleted)
Cardiology Office Note Date:  06/25/2018  Patient ID:  Cameron Robin., DOB June 17, 1940, MRN [id number] PCP:  [name], MD  Cardiologist:  Dr. Saunders Revel, MD  ***refresh   Chief Complaint: ***  History of Present Illness: Cameron Gasper. is a 78 y.o. male with history of ***   Past Medical History:  Diagnosis Date  . Alcoholism ([place])   . Arthritis   . Atrial fibrillation ([place])    one episode  . Colon polyp   . Depression    Son died July 02, 2014  . Diverticulitis   . Diverticulosis 30 years  . Dysrhythmia    At Fib 1995  . GERD (gastroesophageal reflux disease)   . Hyperlipidemia   . Hypertension   . Irritable bowel syndrome   . Prostate cancer ([place]) 07-02-10   treated with radiation therapy. Prostate  . Vasovagal syncope     Past Surgical History:  Procedure Laterality Date  . [place] no stents  . CATARACT EXTRACTION, BILATERAL    . COLON RESECTION SIGMOID N/A 12/07/2016   Procedure: COLON RESECTION SIGMOID;  Surgeon: Clayburn Pert, MD;  Location: [facility] ORS;  Service: General;  Laterality: N/A;  . COLON SURGERY  11/2016   Colostomy  . COLONOSCOPY  07-02-14  . COLONOSCOPY WITH PROPOFOL N/A 05/30/2016   Procedure: COLONOSCOPY WITH PROPOFOL;  Surgeon: Lollie Sails, MD;  Location: [facility] ENDOSCOPY;  Service: Endoscopy;  Laterality: N/A;  . COLOSTOMY Left 12/07/2016   Procedure: COLOSTOMY;  Surgeon: Clayburn Pert, MD;  Location: [facility] ORS;  Service: General;  Laterality: Left;  . COLOSTOMY REVERSAL N/A 03/21/2017   Procedure: COLOSTOMY REVERSAL;  Surgeon: Clayburn Pert, MD;  Location: [facility] ORS;  Service: General;  Laterality: N/A;  . COLOSTOMY TAKEDOWN N/A 03/21/2017   Procedure: LAPAROSCOPIC COLOSTOMY TAKEDOWN;  Surgeon: Clayburn Pert, MD;  Location: [facility] ORS;  Service: General;  Laterality: N/A;  . CYSTOSCOPY WITH STENT PLACEMENT Bilateral 03/21/2017   Procedure: CYSTOSCOPY WITH LIGHTED STENT PLACEMENT;  Surgeon: Abbie Sons, MD;  Location: [facility] ORS;  Service: Urology;  Laterality: Bilateral;  . ESOPHAGOGASTRODUODENOSCOPY    . ESOPHAGOGASTRODUODENOSCOPY N/A 05/30/2016   Procedure: ESOPHAGOGASTRODUODENOSCOPY (EGD);  Surgeon: Lollie Sails, MD;  Location[facility] ENDOSCOPY;  Service: Endoscopy;  Laterality: N/A;  . EYE SURGERY     cataracts  . FLEXIBLE SIGMOIDOSCOPY N/A 03/21/2017   Procedure: FLEXIBLE SIGMOIDOSCOPY;  Surgeon: Clayburn Pert, MD;  Location: [facility] ORS;  Service: General;  Laterality: N/A;  . FRACTURE SURGERY Bilateral    right arm and left wrist  . INCISION AND DRAINAGE ABSCESS N/A 12/07/2016   Procedure: DRAINAGE  OF INTRA ABDOMINAL ABSCESS;  Surgeon: Clayburn Pert, MD;  Location: [facility] ORS;  Service: General;  Laterality: N/A;  . KNEE ARTHROSCOPY    . LAPAROTOMY N/A 12/07/2016   Procedure: EXPLORATORY LAPAROTOMY;  Surgeon: Clayburn Pert, MD;  Location: [facility] ORS;  Service: General;  Laterality: N/A;  . PROSTATE SURGERY     Microwave therapy  . TONSILLECTOMY    . TOTAL KNEE ARTHROPLASTY Right 07/27/2016   Procedure: TOTAL KNEE ARTHROPLASTY;  Surgeon: [name], MD;  Location: [facility] ORS;  Service: Orthopedics;  Laterality: Right;  Dr. Erlene Quan had to place Urinary catheter due to prostate cancer history.  Using flexible scope.    No outpatient medications have been marked as taking for the 06/25/18 encounter (Appointment) with Rise Mu, PA-C.    Allergies:   Augmentin [amoxicillin-pot clavulanate]; Formaldehyde; and Tape  Social History:  The patient  reports that he quit smoking about 30 years ago. His smoking use included cigarettes. He has a 27.00 pack-year smoking history. He has quit using smokeless tobacco.  His smokeless tobacco use included chew. He reports current alcohol use of about 3.0 standard drinks of alcohol per week. He reports that he does not use drugs.   Family History:  The patient's family history includes Bipolar disorder in his son; Kidney disease in his  daughter; Lung cancer in his father; Other in his mother; Sudden death in his son.  ROS:   ROS   PHYSICAL EXAM: *** VS:  There were no vitals taken for this visit. BMI: There is no height or weight on file to calculate BMI.  Physical Exam   EKG:  Was ordered and interpreted by me today. Shows ***  Recent Labs: 02/21/2018: ALT 8; BUN 13; Creatinine, Ser 1.28; Hemoglobin 12.4; Platelets 251.0; Potassium 3.9; Sodium 137  05/18/2018: Chol/HDL Ratio 5.0; Cholesterol, Total 213; HDL 43; LDL Calculated 141; Triglycerides 143   CrCl cannot be calculated (Patient's most recent lab result is older than the maximum 21 days allowed.).   Wt Readings from Last 3 Encounters:  05/14/18 215 lb 12.8 oz (97.9 kg)  05/02/18 214 lb (97.1 kg)  04/19/18 216 lb 12.8 oz (98.3 kg)     Other studies reviewed: Additional studies/records reviewed today include: summarized above  ASSESSMENT AND PLAN:  1. ***  Disposition: F/u with Dr. Saunders Revel or an APP in ***  Current medicines are reviewed at length with the patient today.  The patient did not have any concerns regarding medicines.  Signed, Christell Faith, PA-C 06/25/2018 8:53 AM     [facility] [address] [place] [address] [place], [place] [address] [phone]

## 2018-07-05 ENCOUNTER — Ambulatory Visit: Payer: PRIVATE HEALTH INSURANCE | Admitting: Pulmonary Disease

## 2018-07-06 ENCOUNTER — Other Ambulatory Visit: Payer: Self-pay | Admitting: Family Medicine

## 2018-07-06 ENCOUNTER — Telehealth: Payer: Self-pay | Admitting: Family Medicine

## 2018-07-06 NOTE — Telephone Encounter (Signed)
Spoke with patient he wanted to know if his wife could be seen here for a acute visit if needed but she is not a patient here. Patient advised that she would need to be a established patient.

## 2018-07-06 NOTE — Telephone Encounter (Signed)
Pt needing a call back from Dr. B herself if possible.  Please call pt back today.  Thanks, [employer]

## 2018-08-02 ENCOUNTER — Other Ambulatory Visit: Payer: Self-pay | Admitting: Urology

## 2018-08-02 DIAGNOSIS — C61 Malignant neoplasm of prostate: Secondary | ICD-10-CM

## 2018-08-09 DIAGNOSIS — Z85828 Personal history of other malignant neoplasm of skin: Secondary | ICD-10-CM | POA: Diagnosis not present

## 2018-08-09 DIAGNOSIS — L578 Other skin changes due to chronic exposure to nonionizing radiation: Secondary | ICD-10-CM | POA: Diagnosis not present

## 2018-08-09 DIAGNOSIS — R21 Rash and other nonspecific skin eruption: Secondary | ICD-10-CM | POA: Diagnosis not present

## 2018-08-09 DIAGNOSIS — L821 Other seborrheic keratosis: Secondary | ICD-10-CM | POA: Diagnosis not present

## 2018-08-09 DIAGNOSIS — L820 Inflamed seborrheic keratosis: Secondary | ICD-10-CM | POA: Diagnosis not present

## 2018-08-09 DIAGNOSIS — Z8546 Personal history of malignant neoplasm of prostate: Secondary | ICD-10-CM | POA: Diagnosis not present

## 2018-08-13 ENCOUNTER — Encounter: Payer: Self-pay | Admitting: Family Medicine

## 2018-08-13 ENCOUNTER — Ambulatory Visit (INDEPENDENT_AMBULATORY_CARE_PROVIDER_SITE_OTHER): Payer: Medicare Other | Admitting: Family Medicine

## 2018-08-13 ENCOUNTER — Telehealth: Payer: Self-pay | Admitting: Family Medicine

## 2018-08-13 DIAGNOSIS — J0101 Acute recurrent maxillary sinusitis: Secondary | ICD-10-CM | POA: Diagnosis not present

## 2018-08-13 MED ORDER — DOXYCYCLINE HYCLATE 100 MG PO TABS: 100.0000 mg | ORAL_TABLET | Freq: Two times a day (BID) | ORAL | 0 refills | Status: DC

## 2018-08-13 NOTE — Telephone Encounter (Signed)
Yes to evisit

## 2018-08-13 NOTE — Patient Instructions (Signed)
Sinusitis, Adult  Sinusitis is inflammation of your sinuses. Sinuses are hollow spaces in the bones around your face. Your sinuses are located:   Around your eyes.   In the middle of your forehead.   Behind your nose.   In your cheekbones.  Mucus normally drains out of your sinuses. When your nasal tissues become inflamed or swollen, mucus can become trapped or blocked. This allows bacteria, viruses, and fungi to grow, which leads to infection. Most infections of the sinuses are caused by a virus.  Sinusitis can develop quickly. It can last for up to 4 weeks (acute) or for more than 12 weeks (chronic). Sinusitis often develops after a cold.  What are the causes?  This condition is caused by anything that creates swelling in the sinuses or stops mucus from draining. This includes:   Allergies.   Asthma.   Infection from bacteria or viruses.   Deformities or blockages in your nose or sinuses.   Abnormal growths in the nose (nasal polyps).   Pollutants, such as chemicals or irritants in the air.   Infection from fungi (rare).  What increases the risk?  You are more likely to develop this condition if you:   Have a weak body defense system (immune system).   Do a lot of swimming or diving.   Overuse nasal sprays.   Smoke.  What are the signs or symptoms?  The main symptoms of this condition are pain and a feeling of pressure around the affected sinuses. Other symptoms include:   Stuffy nose or congestion.   Thick drainage from your nose.   Swelling and warmth over the affected sinuses.   Headache.   Upper toothache.   A cough that may get worse at night.   Extra mucus that collects in the throat or the back of the nose (postnasal drip).   Decreased sense of smell and taste.   Fatigue.   A fever.   Sore throat.   Bad breath.  How is this diagnosed?  This condition is diagnosed based on:   Your symptoms.   Your medical history.   A physical exam.   Tests to find out if your condition is  acute or chronic. This may include:  ? Checking your nose for nasal polyps.  ? Viewing your sinuses using a device that has a light (endoscope).  ? Testing for allergies or bacteria.  ? Imaging tests, such as an MRI or CT scan.  In rare cases, a bone biopsy may be done to rule out more serious types of fungal sinus disease.  How is this treated?  Treatment for sinusitis depends on the cause and whether your condition is chronic or acute.   If caused by a virus, your symptoms should go away on their own within 10 days. You may be given medicines to relieve symptoms. They include:  ? Medicines that shrink swollen nasal passages (topical intranasal decongestants).  ? Medicines that treat allergies (antihistamines).  ? A spray that eases inflammation of the nostrils (topical intranasal corticosteroids).  ? Rinses that help get rid of thick mucus in your nose (nasal saline washes).   If caused by bacteria, your health care provider may recommend waiting to see if your symptoms improve. Most bacterial infections will get better without antibiotic medicine. You may be given antibiotics if you have:  ? A severe infection.  ? A weak immune system.   If caused by narrow nasal passages or nasal polyps, you may need   to have surgery.  Follow these instructions at home:  Medicines   Take, use, or apply over-the-counter and prescription medicines only as told by your health care provider. These may include nasal sprays.   If you were prescribed an antibiotic medicine, take it as told by your health care provider. Do not stop taking the antibiotic even if you start to feel better.  Hydrate and humidify     Drink enough fluid to keep your urine pale yellow. Staying hydrated will help to thin your mucus.   Use a cool mist humidifier to keep the humidity level in your home above 50%.   Inhale steam for 10-15 minutes, 3-4 times a day, or as told by your health care provider. You can do this in the bathroom while a hot shower is  running.   Limit your exposure to cool or dry air.  Rest   Rest as much as possible.   Sleep with your head raised (elevated).   Make sure you get enough sleep each night.  General instructions     Apply a warm, moist washcloth to your face 3-4 times a day or as told by your health care provider. This will help with discomfort.   Wash your hands often with soap and water to reduce your exposure to germs. If soap and water are not available, use hand sanitizer.   Do not smoke. Avoid being around people who are smoking (secondhand smoke).   Keep all follow-up visits as told by your health care provider. This is important.  Contact a health care provider if:   You have a fever.   Your symptoms get worse.   Your symptoms do not improve within 10 days.  Get help right away if:   You have a severe headache.   You have persistent vomiting.   You have severe pain or swelling around your face or eyes.   You have vision problems.   You develop confusion.   Your neck is stiff.   You have trouble breathing.  Summary   Sinusitis is soreness and inflammation of your sinuses. Sinuses are hollow spaces in the bones around your face.   This condition is caused by nasal tissues that become inflamed or swollen. The swelling traps or blocks the flow of mucus. This allows bacteria, viruses, and fungi to grow, which leads to infection.   If you were prescribed an antibiotic medicine, take it as told by your health care provider. Do not stop taking the antibiotic even if you start to feel better.   Keep all follow-up visits as told by your health care provider. This is important.  This information is not intended to replace advice given to you by your health care provider. Make sure you discuss any questions you have with your health care provider.  Document Released: 04/04/2005 Document Revised: 09/04/2017 Document Reviewed: 09/04/2017  [employer]vier Interactive Patient Education  2019 [employer]

## 2018-08-13 NOTE — Progress Notes (Signed)
Patient: Cameron Foster. Male    DOB: Jul 23, 1940   78 y.o.   MRN: [id number] Visit Date: 08/13/2018  Today's Provider: Lavon Paganini, MD   Chief Complaint  Patient presents with  . URI   Subjective:    Virtual Visit via Video Note  I connected with Cameron Foster. on 08/13/18 at 11:00 AM EDT by a video enabled telemedicine application and verified that I am speaking with the correct person using two identifiers.   I discussed the limitations of evaluation and management by telemedicine and the availability of in person appointments. The patient expressed understanding and agreed to proceed.   Patient location: home Provider location: home office  Persons involved in the visit: patient, provider    URI   This is a new problem. The current episode started in the past 7 days. The problem has been gradually improving. Associated symptoms include congestion and sinus pain. Associated symptoms comments: Fever .   Patient's symptoms started ~1 wk ago with sinus pressure and congestion.  Yesterday, developed post-nasal drip associated with cough and fever of 100.44F.  Fever gone today but continues to have sinus congestion.  Feels similar to previous sinus infections.  He has had many recurrent sinus infections since he fractured his nose and has a deviated septum.  He does use Flonase daily as a preventive measure.   Allergies  Allergen Reactions  . Augmentin [Amoxicillin-Pot Clavulanate] Other (See Comments)    Abdominal upset Has patient had a PCN reaction causing immediate rash, facial/tongue/throat swelling, SOB or lightheadedness with hypotension: Unknown Has patient had a PCN reaction causing severe rash involving mucus membranes or skin necrosis: Unknown Has patient had a PCN reaction that required hospitalization: Unknown Has patient had a PCN reaction occurring within the last 10 years: Unknown If all of the above answers are "NO", then may proceed with  Cephalosporin use.   . Formaldehyde Rash  . Tape Rash    Pt states he is not allergic to tape.     Current Outpatient Medications:  .  amLODipine (NORVASC) 5 MG tablet, TAKE 1 TABLET BY MOUTH EVERY DAY, Disp: 90 tablet, Rfl: 2 .  docusate sodium (STOOL SOFTENER) 100 MG capsule, Take 100 mg by mouth 2 (two) times daily., Disp: , Rfl:  .  fluticasone (FLONASE) 50 MCG/ACT nasal spray, Place 1 spray into both nostrils daily as needed for allergies or rhinitis., Disp: , Rfl:  .  hydrocortisone 2.5 % cream, APPLY CREAM TO AFFECTED AREA TWICE DAILY AS NEEDED FOR RASH, Disp: 28 g, Rfl: 11 .  lisinopril (PRINIVIL,ZESTRIL) 10 MG tablet, TAKE 1 TABLET BY MOUTH EVERY DAY, Disp: 90 tablet, Rfl: 1 .  liver oil-zinc oxide (DESITIN) 40 % ointment, Apply 1 application topically daily as needed for irritation., Disp: , Rfl:  .  meloxicam (MOBIC) 15 MG tablet, Take 15 mg by mouth daily. , Disp: , Rfl:  .  nebivolol (BYSTOLIC) 10 MG tablet, Take 1 tablet (10 mg total) by mouth daily., Disp: 90 tablet, Rfl: 2 .  omeprazole (PRILOSEC) 20 MG capsule, Take 20 mg by mouth daily as needed (heartburn). , Disp: , Rfl:  .  sertraline (ZOLOFT) 100 MG tablet, Take 1 tablet (100 mg total) by mouth daily., Disp: 90 tablet, Rfl: 3 .  furosemide (LASIX) 20 MG tablet, Take 1 tablet (20 mg total) by mouth daily., Disp: 90 tablet, Rfl: 3  Review of Systems  Constitutional: Positive for fever.  HENT:  Positive for congestion and sinus pain.   Respiratory: Negative.   Cardiovascular: Negative.   Musculoskeletal: Negative.     Social History   Tobacco Use  . Smoking status: Former Smoker    Packs/day: 1.00    Years: 27.00    Pack years: 27.00    Types: Cigarettes    Last attempt to quit: 04/18/1988    Years since quitting: 30.3  . Smokeless tobacco: Former Systems developer    Types: Chew  Substance Use Topics  . Alcohol use: Yes    Alcohol/week: 3.0 standard drinks    Types: 3 Standard drinks or equivalent per week    Comment:  Occasional- Former Heavy ETOH Use      Objective:   There were no vitals taken for this visit. There were no vitals filed for this visit.   Physical Exam Constitutional:      Appearance: Normal appearance.  HENT:     Nose:     Comments: Nasal deviation noted Pulmonary:     Effort: Pulmonary effort is normal. No respiratory distress.  Neurological:     Mental Status: He is alert and oriented to person, place, and time. Mental status is at baseline.  Psychiatric:        Mood and Affect: Mood normal.        Behavior: Behavior normal.         Assessment & Plan    I discussed the assessment and treatment plan with the patient. The patient was provided an opportunity to ask questions and all were answered. The patient agreed with the plan and demonstrated an understanding of the instructions.   The patient was advised to call back or seek an in-person evaluation if the symptoms worsen or if the condition fails to improve as anticipated.  1. Acute recurrent maxillary sinusitis - symptoms and exam c/w sinusitis   - no evidence of AOM, CAP, strep pharyngitis, or other infection - given duration of symptoms, suspect bacterial etiology - will treat with Doxycycline x10d - discussed symptomatic management (flonase, decongestants, etc), natural course, and return precautions  - we did also discuss signs and symptoms of COVID19 infection and when he'd need to seek care - but doubt this given symptoms related to sinusitis    Meds ordered this encounter  Medications  . doxycycline (VIBRA-TABS) 100 MG tablet    Sig: Take 1 tablet (100 mg total) by mouth 2 (two) times daily for 10 days.    Dispense:  20 tablet    Refill:  0     Return if symptoms worsen or fail to improve.   The entirety of the information documented in the History of Present Illness, Review of Systems and Physical Exam were personally obtained by me. Portions of this information were initially documented by Tiburcio Pea, CMA and reviewed by me for thoroughness and accuracy.    [name], MD, MPH [facility] 08/13/2018 11:36 AM

## 2018-08-13 NOTE — Telephone Encounter (Signed)
Last nigh pt had a fever 100.4 - congested with productive cough.  Feeling much better today.  Please call pt back to discuss.  Thanks, [employer]

## 2018-08-13 NOTE — Telephone Encounter (Signed)
Patient scheduled for a e-visit

## 2018-08-14 ENCOUNTER — Telehealth: Payer: Self-pay | Admitting: *Deleted

## 2018-08-14 DIAGNOSIS — J0140 Acute pansinusitis, unspecified: Secondary | ICD-10-CM

## 2018-08-14 MED ORDER — CEFDINIR 300 MG PO CAPS: 300.0000 mg | ORAL_CAPSULE | Freq: Two times a day (BID) | ORAL | 0 refills | Status: DC

## 2018-08-14 NOTE — Telephone Encounter (Signed)
Patient advised.

## 2018-08-14 NOTE — Telephone Encounter (Signed)
Patient called stating the doxycycline he was prescribed yesterday was to expensive and would like a different antibiotic sent to pharmacy. Please advise? [facility] [address]

## 2018-08-14 NOTE — Telephone Encounter (Signed)
Changed to cefdinir.

## 2018-08-20 ENCOUNTER — Encounter: Payer: Self-pay | Admitting: Family Medicine

## 2018-08-20 ENCOUNTER — Ambulatory Visit (INDEPENDENT_AMBULATORY_CARE_PROVIDER_SITE_OTHER): Payer: Medicare Other | Admitting: Family Medicine

## 2018-08-20 VITALS — Temp 98.7°F

## 2018-08-20 DIAGNOSIS — J0101 Acute recurrent maxillary sinusitis: Secondary | ICD-10-CM | POA: Diagnosis not present

## 2018-08-20 MED ORDER — DOXYCYCLINE HYCLATE 100 MG PO CAPS: 100.0000 mg | ORAL_CAPSULE | Freq: Two times a day (BID) | ORAL | 0 refills | Status: AC

## 2018-08-20 NOTE — Progress Notes (Signed)
Patient: Cameron Foster. Male    DOB: December 19, 1940   78 y.o.   MRN: [id number] Visit Date: 08/23/2018  Today's Provider: Lavon Paganini, MD   Chief Complaint  Patient presents with  . Sinusitis   Subjective:    Virtual Visit via Video Note  I connected with Cameron Foster. on 08/23/18 at  2:00 PM EDT by a video enabled telemedicine application and verified that I am speaking with the correct person using two identifiers.   Patient location: home Provider location: home office Persons involved in the visit: patient, provider    I discussed the limitations of evaluation and management by telemedicine and the availability of in person appointments. The patient expressed understanding and agreed to proceed.  Sinusitis  This is a new problem. The current episode started 1 to 4 weeks ago. The problem is unchanged. Associated symptoms include congestion, coughing and sinus pressure. Treatments tried: Doxycycline and Cefdinir   Could not afford Doxycycline and switched to Cefdinir. It gave him abd pain and he had an episode of vasovagal syncope  Continues to have sinus pain, pressure, congestion.  He does not feel like this got better on cefdinir.  He continues to use nasal saline and Flonase daily.  Denies shortness of breath or fever.  Cough seems to be coming from postnasal drip.  Allergies  Allergen Reactions  . Augmentin [Amoxicillin-Pot Clavulanate] Other (See Comments)    Abdominal upset Has patient had a PCN reaction causing immediate rash, facial/tongue/throat swelling, SOB or lightheadedness with hypotension: Unknown Has patient had a PCN reaction causing severe rash involving mucus membranes or skin necrosis: Unknown Has patient had a PCN reaction that required hospitalization: Unknown Has patient had a PCN reaction occurring within the last 10 years: Unknown If all of the above answers are "NO", then may proceed with Cephalosporin use.   . Formaldehyde Rash  .  Tape Rash    Pt states he is not allergic to tape.     Current Outpatient Medications:  .  amLODipine (NORVASC) 5 MG tablet, TAKE 1 TABLET BY MOUTH EVERY DAY, Disp: 90 tablet, Rfl: 2 .  docusate sodium (STOOL SOFTENER) 100 MG capsule, Take 100 mg by mouth 2 (two) times daily., Disp: , Rfl:  .  fluticasone (FLONASE) 50 MCG/ACT nasal spray, Place 1 spray into both nostrils daily as needed for allergies or rhinitis., Disp: , Rfl:  .  hydrocortisone 2.5 % cream, APPLY CREAM TO AFFECTED AREA TWICE DAILY AS NEEDED FOR RASH, Disp: 28 g, Rfl: 11 .  lisinopril (PRINIVIL,ZESTRIL) 10 MG tablet, TAKE 1 TABLET BY MOUTH EVERY DAY, Disp: 90 tablet, Rfl: 1 .  liver oil-zinc oxide (DESITIN) 40 % ointment, Apply 1 application topically daily as needed for irritation., Disp: , Rfl:  .  meloxicam (MOBIC) 15 MG tablet, Take 15 mg by mouth daily. , Disp: , Rfl:  .  nebivolol (BYSTOLIC) 10 MG tablet, Take 1 tablet (10 mg total) by mouth daily., Disp: 90 tablet, Rfl: 2 .  omeprazole (PRILOSEC) 20 MG capsule, Take 20 mg by mouth daily as needed (heartburn). , Disp: , Rfl:  .  sertraline (ZOLOFT) 100 MG tablet, Take 1 tablet (100 mg total) by mouth daily., Disp: 90 tablet, Rfl: 3 .  doxycycline (VIBRAMYCIN) 100 MG capsule, Take 1 capsule (100 mg total) by mouth 2 (two) times daily for 10 days., Disp: 20 capsule, Rfl: 0 .  furosemide (LASIX) 20 MG tablet, Take 1 tablet (20  mg total) by mouth daily., Disp: 90 tablet, Rfl: 3  Review of Systems  Constitutional: Negative.   HENT: Positive for congestion, sinus pressure and sinus pain.   Respiratory: Positive for cough.   Cardiovascular: Negative.   Musculoskeletal: Negative.     Social History   Tobacco Use  . Smoking status: Former Smoker    Packs/day: 1.00    Years: 27.00    Pack years: 27.00    Types: Cigarettes    Last attempt to quit: 04/18/1988    Years since quitting: 30.3  . Smokeless tobacco: Former Systems developer    Types: Chew  Substance Use Topics  .  Alcohol use: Yes    Alcohol/week: 3.0 standard drinks    Types: 3 Standard drinks or equivalent per week    Comment: Occasional- Former Heavy ETOH Use      Objective:   Temp 98.7 F (37.1 C)  Vitals:   08/20/18 1405  Temp: 98.7 F (37.1 C)     Physical Exam Constitutional:      Appearance: Normal appearance.  Pulmonary:     Effort: Pulmonary effort is normal. No respiratory distress.  Neurological:     Mental Status: He is alert and oriented to person, place, and time. Mental status is at baseline.  Psychiatric:        Mood and Affect: Mood normal.        Behavior: Behavior normal.         Assessment & Plan      I discussed the assessment and treatment plan with the patient. The patient was provided an opportunity to ask questions and all were answered. The patient agreed with the plan and demonstrated an understanding of the instructions.   The patient was advised to call back or seek an in-person evaluation if the symptoms worsen or if the condition fails to improve as anticipated.  1. Acute recurrent maxillary sinusitis - symptoms and exam c/w sinusitis   - no evidence of AOM, CAP, strep pharyngitis, or other infection - given duration of symptoms, suspect bacterial etiology -No improvement with cefdinir -Gets recurrent sinusitis related to deviated septum and previous nasal injury - will treat with doxycycline x 10 d -previously could not afford the co-pay for this, but gave him the good Rx coupon to get this at an affordable rate - discussed symptomatic management (flonase, decongestants, etc), natural course, and return precautions      Meds ordered this encounter  Medications  . doxycycline (VIBRAMYCIN) 100 MG capsule    Sig: Take 1 capsule (100 mg total) by mouth 2 (two) times daily for 10 days.    Dispense:  20 capsule    Refill:  0     Return if symptoms worsen or fail to improve.   The entirety of the information documented in the History of  Present Illness, Review of Systems and Physical Exam were personally obtained by me. Portions of this information were initially documented by Tiburcio Pea, CMA and reviewed by me for thoroughness and accuracy.    [name], MD, MPH [facility] 08/23/2018 11:32 AM

## 2018-08-23 ENCOUNTER — Encounter: Payer: Self-pay | Admitting: Family Medicine

## 2018-08-23 NOTE — Patient Instructions (Signed)
Sinusitis, Adult  Sinusitis is inflammation of your sinuses. Sinuses are hollow spaces in the bones around your face. Your sinuses are located:   Around your eyes.   In the middle of your forehead.   Behind your nose.   In your cheekbones.  Mucus normally drains out of your sinuses. When your nasal tissues become inflamed or swollen, mucus can become trapped or blocked. This allows bacteria, viruses, and fungi to grow, which leads to infection. Most infections of the sinuses are caused by a virus.  Sinusitis can develop quickly. It can last for up to 4 weeks (acute) or for more than 12 weeks (chronic). Sinusitis often develops after a cold.  What are the causes?  This condition is caused by anything that creates swelling in the sinuses or stops mucus from draining. This includes:   Allergies.   Asthma.   Infection from bacteria or viruses.   Deformities or blockages in your nose or sinuses.   Abnormal growths in the nose (nasal polyps).   Pollutants, such as chemicals or irritants in the air.   Infection from fungi (rare).  What increases the risk?  You are more likely to develop this condition if you:   Have a weak body defense system (immune system).   Do a lot of swimming or diving.   Overuse nasal sprays.   Smoke.  What are the signs or symptoms?  The main symptoms of this condition are pain and a feeling of pressure around the affected sinuses. Other symptoms include:   Stuffy nose or congestion.   Thick drainage from your nose.   Swelling and warmth over the affected sinuses.   Headache.   Upper toothache.   A cough that may get worse at night.   Extra mucus that collects in the throat or the back of the nose (postnasal drip).   Decreased sense of smell and taste.   Fatigue.   A fever.   Sore throat.   Bad breath.  How is this diagnosed?  This condition is diagnosed based on:   Your symptoms.   Your medical history.   A physical exam.   Tests to find out if your condition is  acute or chronic. This may include:  ? Checking your nose for nasal polyps.  ? Viewing your sinuses using a device that has a light (endoscope).  ? Testing for allergies or bacteria.  ? Imaging tests, such as an MRI or CT scan.  In rare cases, a bone biopsy may be done to rule out more serious types of fungal sinus disease.  How is this treated?  Treatment for sinusitis depends on the cause and whether your condition is chronic or acute.   If caused by a virus, your symptoms should go away on their own within 10 days. You may be given medicines to relieve symptoms. They include:  ? Medicines that shrink swollen nasal passages (topical intranasal decongestants).  ? Medicines that treat allergies (antihistamines).  ? A spray that eases inflammation of the nostrils (topical intranasal corticosteroids).  ? Rinses that help get rid of thick mucus in your nose (nasal saline washes).   If caused by bacteria, your health care provider may recommend waiting to see if your symptoms improve. Most bacterial infections will get better without antibiotic medicine. You may be given antibiotics if you have:  ? A severe infection.  ? A weak immune system.   If caused by narrow nasal passages or nasal polyps, you may need   to have surgery.  Follow these instructions at home:  Medicines   Take, use, or apply over-the-counter and prescription medicines only as told by your health care provider. These may include nasal sprays.   If you were prescribed an antibiotic medicine, take it as told by your health care provider. Do not stop taking the antibiotic even if you start to feel better.  Hydrate and humidify     Drink enough fluid to keep your urine pale yellow. Staying hydrated will help to thin your mucus.   Use a cool mist humidifier to keep the humidity level in your home above 50%.   Inhale steam for 10-15 minutes, 3-4 times a day, or as told by your health care provider. You can do this in the bathroom while a hot shower is  running.   Limit your exposure to cool or dry air.  Rest   Rest as much as possible.   Sleep with your head raised (elevated).   Make sure you get enough sleep each night.  General instructions     Apply a warm, moist washcloth to your face 3-4 times a day or as told by your health care provider. This will help with discomfort.   Wash your hands often with soap and water to reduce your exposure to germs. If soap and water are not available, use hand sanitizer.   Do not smoke. Avoid being around people who are smoking (secondhand smoke).   Keep all follow-up visits as told by your health care provider. This is important.  Contact a health care provider if:   You have a fever.   Your symptoms get worse.   Your symptoms do not improve within 10 days.  Get help right away if:   You have a severe headache.   You have persistent vomiting.   You have severe pain or swelling around your face or eyes.   You have vision problems.   You develop confusion.   Your neck is stiff.   You have trouble breathing.  Summary   Sinusitis is soreness and inflammation of your sinuses. Sinuses are hollow spaces in the bones around your face.   This condition is caused by nasal tissues that become inflamed or swollen. The swelling traps or blocks the flow of mucus. This allows bacteria, viruses, and fungi to grow, which leads to infection.   If you were prescribed an antibiotic medicine, take it as told by your health care provider. Do not stop taking the antibiotic even if you start to feel better.   Keep all follow-up visits as told by your health care provider. This is important.  This information is not intended to replace advice given to you by your health care provider. Make sure you discuss any questions you have with your health care provider.  Document Released: 04/04/2005 Document Revised: 09/04/2017 Document Reviewed: 09/04/2017  [employer]vier Interactive Patient Education  2019 [employer]

## 2018-08-28 ENCOUNTER — Other Ambulatory Visit: Payer: Medicare Other

## 2018-08-28 ENCOUNTER — Other Ambulatory Visit: Payer: Self-pay

## 2018-08-28 DIAGNOSIS — R6882 Decreased libido: Secondary | ICD-10-CM | POA: Diagnosis not present

## 2018-08-28 DIAGNOSIS — C61 Malignant neoplasm of prostate: Secondary | ICD-10-CM | POA: Diagnosis not present

## 2018-08-29 LAB — PSA: Prostate Specific Ag, Serum: <0.1 ng/mL (ref 0.0–4.0)

## 2018-08-30 ENCOUNTER — Telehealth (INDEPENDENT_AMBULATORY_CARE_PROVIDER_SITE_OTHER): Payer: Medicare Other | Admitting: Urology

## 2018-08-30 ENCOUNTER — Other Ambulatory Visit: Payer: Self-pay

## 2018-08-30 ENCOUNTER — Telehealth: Payer: Self-pay | Admitting: Urology

## 2018-08-30 DIAGNOSIS — Z8546 Personal history of malignant neoplasm of prostate: Secondary | ICD-10-CM | POA: Diagnosis not present

## 2018-08-30 DIAGNOSIS — R6882 Decreased libido: Secondary | ICD-10-CM

## 2018-08-30 NOTE — Progress Notes (Signed)
Virtual Visit via Telephone Note  I connected with Cameron Foster. on 08/30/18 at  2:00 PM EDT by telephone and verified that I am speaking with the correct person using two identifiers.  Location: [facility] Provider: Home office   I discussed the limitations, risks, security and privacy concerns of performing an evaluation and management service by telephone and the availability of in person appointments. I also discussed with the patient that there may be a patient responsible charge related to this service. The patient expressed understanding and agreed to proceed.   History of Present Illness: 78 year old male presents for annual visit.  He was scheduled for a telephone visit secondary to COVID-19 pandemic.  He was outside our office when I contacted him stating he was not aware this was a telephone visit.  He last saw Dr. Pilar Jarvis in May 2019.  1 - Moderate Risk Prostate Cancer- s/p external beam + 46mo andogen deprivation for Gleason 7 disease 2012 in [place] 08/2018 PSA < 0.1  2 - Erectile Dysfunction- inability to achieve erection x years. Used to use PDE5i but now minimaly sexually active and not big priority.  3 - Lower Urinary Tract Symptoms- long h/o mix of obstrutive and irritative symptoms even pre-radiation therapy.  Previous PVRs have been low.  He is not bothered by his symptoms at this time.  He does note some post void dribbling but is not concerned by this.  He feels he empties his bladder with a good stream.   4 - Gross Hematuria / Clot Retention- history of gross hematuria / retention 07/2015 prompting bedside cysto / clot evacuation by Dr. WJeffie Pollock NO large bladder masses, but cysto done at time of large volume clot. CT showed bilateral renal cysts (largest 6.5 cm) and bladder wall thickening.  He also had a cystoscopy and December 2018 when his temporary lead ureteral stents were placed.  There is bladder trabeculation but  no masses.  No recurrent episodes of gross hematuria.  He states his voiding pattern is stable and not bothersome.  His biggest concern today is significant decreased libido and loss of penile size.  He also has erectile dysfunction.  He is in poor health and he is not interested in starting sexual activity but states he is embarrassed by his penile size.  PSA performed on 08/28/2018 remains undetectable at <0.1  Observations/Objective:   Assessment and Plan: 78year old male with history prostate cancer status post external beam radiotherapy.  He did receive 6 months of androgen deprivation.  His libido has been longstanding since the prostate cancer treatment and it is possible his testes may not have recovered from ADT.  He was interested in and having a testosterone level checked.  He was informed that if he has lost penile size due to decreased blood flow this will not be reversible but treatment could prevent further atrophy.  We also discussed starting a low-dose PDE 5 inhibitor to promote blood flow as an alternative.  Follow Up Instructions: He will be contacted with his testosterone results and further recommendations.   I discussed the assessment and treatment plan with the patient. The patient was provided an opportunity to ask questions and all were answered. The patient agreed with the plan and demonstrated an understanding of the instructions.   The patient was advised to call back or seek an in-person evaluation if the symptoms worsen or if the condition fails to improve as anticipated.  I provided 15 minutes of non-face-to-face  time during this encounter.   Abbie Sons, MD

## 2018-08-30 NOTE — Telephone Encounter (Signed)
Per Dr. Bernardo Heater:  Please see if the lab can run a testosterone level on his PSA that was drawn on 5/12. If not he will need a lab visit for a testosterone level.   I called [facility] [phone]) and added on the testosterone level test.  They will fax over a form that needs to be signed and faxed back.

## 2018-09-03 ENCOUNTER — Telehealth: Payer: Self-pay

## 2018-09-03 DIAGNOSIS — L820 Inflamed seborrheic keratosis: Secondary | ICD-10-CM | POA: Diagnosis not present

## 2018-09-03 DIAGNOSIS — L821 Other seborrheic keratosis: Secondary | ICD-10-CM | POA: Diagnosis not present

## 2018-09-03 NOTE — Progress Notes (Signed)
Request for [facility] was faxed

## 2018-09-03 NOTE — Telephone Encounter (Signed)
mychart sent.

## 2018-09-03 NOTE — Telephone Encounter (Signed)
-----   Message from Abbie Sons, MD sent at 09/03/2018  2:33 PM EDT ----- Testosterone level was slightly low at 206 however with a history high-grade prostate cancer would not recommend testosterone replacement

## 2018-09-04 LAB — SPECIMEN STATUS REPORT

## 2018-09-04 LAB — TESTOSTERONE: Testosterone: 206 ng/dL — ABNORMAL LOW (ref 264–916)

## 2018-10-10 ENCOUNTER — Emergency Department: Payer: Medicare Other

## 2018-10-10 ENCOUNTER — Encounter: Payer: Self-pay | Admitting: Emergency Medicine

## 2018-10-10 ENCOUNTER — Other Ambulatory Visit: Payer: Self-pay

## 2018-10-10 ENCOUNTER — Emergency Department
Admission: EM | Admit: 2018-10-10 | Discharge: 2018-10-11 | Disposition: A | Discharge status: Other Acute Inpatient Hospital | Payer: Medicare Other | Attending: Emergency Medicine | Admitting: Emergency Medicine

## 2018-10-10 DIAGNOSIS — H4921 Sixth [abducent] nerve palsy, right eye: Secondary | ICD-10-CM

## 2018-10-10 DIAGNOSIS — Z96651 Presence of right artificial knee joint: Secondary | ICD-10-CM | POA: Diagnosis not present

## 2018-10-10 DIAGNOSIS — I613 Nontraumatic intracerebral hemorrhage in brain stem: Secondary | ICD-10-CM | POA: Diagnosis not present

## 2018-10-10 DIAGNOSIS — Y999 Unspecified external cause status: Secondary | ICD-10-CM | POA: Insufficient documentation

## 2018-10-10 DIAGNOSIS — X58XXXA Exposure to other specified factors, initial encounter: Secondary | ICD-10-CM | POA: Insufficient documentation

## 2018-10-10 DIAGNOSIS — I6623 Occlusion and stenosis of bilateral posterior cerebral arteries: Secondary | ICD-10-CM | POA: Diagnosis not present

## 2018-10-10 DIAGNOSIS — S066X0A Traumatic subarachnoid hemorrhage without loss of consciousness, initial encounter: Secondary | ICD-10-CM | POA: Insufficient documentation

## 2018-10-10 DIAGNOSIS — Y939 Activity, unspecified: Secondary | ICD-10-CM | POA: Insufficient documentation

## 2018-10-10 DIAGNOSIS — Z87891 Personal history of nicotine dependence: Secondary | ICD-10-CM | POA: Diagnosis not present

## 2018-10-10 DIAGNOSIS — H538 Other visual disturbances: Secondary | ICD-10-CM | POA: Diagnosis present

## 2018-10-10 DIAGNOSIS — Y929 Unspecified place or not applicable: Secondary | ICD-10-CM | POA: Diagnosis not present

## 2018-10-10 DIAGNOSIS — Z8546 Personal history of malignant neoplasm of prostate: Secondary | ICD-10-CM | POA: Diagnosis not present

## 2018-10-10 DIAGNOSIS — I10 Essential (primary) hypertension: Secondary | ICD-10-CM | POA: Insufficient documentation

## 2018-10-10 DIAGNOSIS — J3489 Other specified disorders of nose and nasal sinuses: Secondary | ICD-10-CM | POA: Diagnosis not present

## 2018-10-10 DIAGNOSIS — I609 Nontraumatic subarachnoid hemorrhage, unspecified: Secondary | ICD-10-CM

## 2018-10-10 DIAGNOSIS — Z79899 Other long term (current) drug therapy: Secondary | ICD-10-CM | POA: Diagnosis not present

## 2018-10-10 HISTORY — DX: Nontraumatic subarachnoid hemorrhage, unspecified: I60.9

## 2018-10-10 LAB — COMPREHENSIVE METABOLIC PANEL
ALT: 10 U/L (ref 0–44)
AST: 20 U/L (ref 15–41)
Albumin: 4 g/dL (ref 3.5–5.0)
Alkaline Phosphatase: 75 U/L (ref 38–126)
Anion gap: 10 (ref 5–15)
BUN: 11 mg/dL (ref 8–23)
CO2: 25 mmol/L (ref 22–32)
Calcium: 9.3 mg/dL (ref 8.9–10.3)
Chloride: 102 mmol/L (ref 98–111)
Creatinine, Ser: 1.12 mg/dL (ref 0.61–1.24)
GFR calc Af Amer: >60 mL/min (ref >60)
GFR calc non Af Amer: >60 mL/min (ref >60)
Glucose, Bld: 129 mg/dL — ABNORMAL HIGH (ref 70–99)
Potassium: 3.4 mmol/L — ABNORMAL LOW (ref 3.5–5.1)
Sodium: 137 mmol/L (ref 135–145)
Total Bilirubin: 0.8 mg/dL (ref 0.3–1.2)
Total Protein: 8 g/dL (ref 6.5–8.1)

## 2018-10-10 LAB — URINALYSIS, COMPLETE (UACMP) WITH MICROSCOPIC
Bacteria, UA: NONE SEEN
Bilirubin Urine: NEGATIVE
Glucose, UA: NEGATIVE mg/dL
Ketones, ur: NEGATIVE mg/dL
Leukocytes,Ua: NEGATIVE
Nitrite: NEGATIVE
Protein, ur: 30 mg/dL — AB
Specific Gravity, Urine: 1.013 (ref 1.005–1.030)
Squamous Epithelial / HPF: NONE SEEN (ref 0–5)
pH: 6 (ref 5.0–8.0)

## 2018-10-10 LAB — CBC WITH DIFFERENTIAL/PLATELET
Abs Immature Granulocytes: 0.02 10*3/uL (ref 0.00–0.07)
Basophils Absolute: 0 10*3/uL (ref 0.0–0.1)
Basophils Relative: 0 %
Eosinophils Absolute: 0 10*3/uL (ref 0.0–0.5)
Eosinophils Relative: 1 %
HCT: 41 % (ref 39.0–52.0)
Hemoglobin: 13 g/dL (ref 13.0–17.0)
Immature Granulocytes: 0 %
Lymphocytes Relative: 11 %
Lymphs Abs: 0.8 10*3/uL (ref 0.7–4.0)
MCH: 27.8 pg (ref 26.0–34.0)
MCHC: 31.7 g/dL (ref 30.0–36.0)
MCV: 87.8 fL (ref 80.0–100.0)
Monocytes Absolute: 0.5 10*3/uL (ref 0.1–1.0)
Monocytes Relative: 7 %
Neutro Abs: 5.6 10*3/uL (ref 1.7–7.7)
Neutrophils Relative %: 81 %
Platelets: 242 10*3/uL (ref 150–400)
RBC: 4.67 MIL/uL (ref 4.22–5.81)
RDW: 13.9 % (ref 11.5–15.5)
WBC: 6.9 10*3/uL (ref 4.0–10.5)
nRBC: 0 % (ref 0.0–0.2)

## 2018-10-10 LAB — PROTIME-INR
INR: 1.1 (ref 0.8–1.2)
Prothrombin Time: 13.6 seconds (ref 11.4–15.2)

## 2018-10-10 LAB — ETHANOL: Alcohol, Ethyl (B): <10 mg/dL (ref <10)

## 2018-10-10 LAB — APTT: aPTT: 37 seconds — ABNORMAL HIGH (ref 24–36)

## 2018-10-10 IMAGING — CT CT ANGIOGRAPHY HEAD
2 of 7 series · 8 of 33 positions shown · IV contrast (APPLIED)
Comparison: Head and face CT [4E] hours today.

CLINICAL DATA: 78-year-old male with acute extra-axial hemorrhage
in the posterior fossa on noncontrast head CT today.

EXAM:
CT ANGIOGRAPHY HEAD AND NECK
TECHNIQUE: Multidetector CT imaging of the head and neck was performed using
the standard protocol during bolus administration of intravenous
contrast. Multiplanar CT image reconstructions and MIPs were
obtained to evaluate the vascular anatomy. Carotid stenosis
measurements (when applicable) are obtained utilizing NASCET
criteria, using the distal internal carotid diameter as the
denominator.
CONTRAST:  75mL OMNIPAQUE IOHEXOL 350 MG/ML SOLN

[Series 4: cta head neck · axial · 0.47mm/px · z∈[+331,+445]mm · 2 of 172 slices shown]
[im 58/172  soft-tissue]
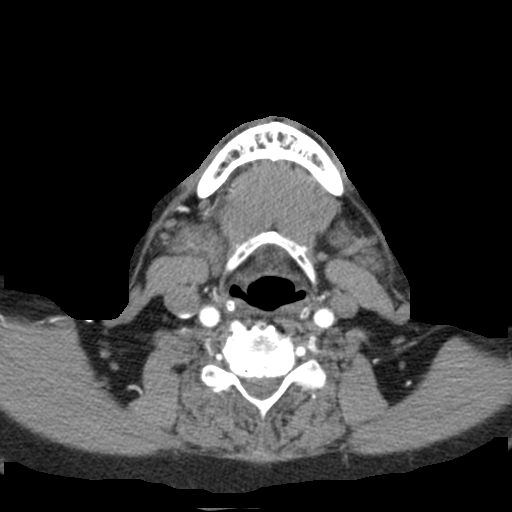
[im 115/172  bone]
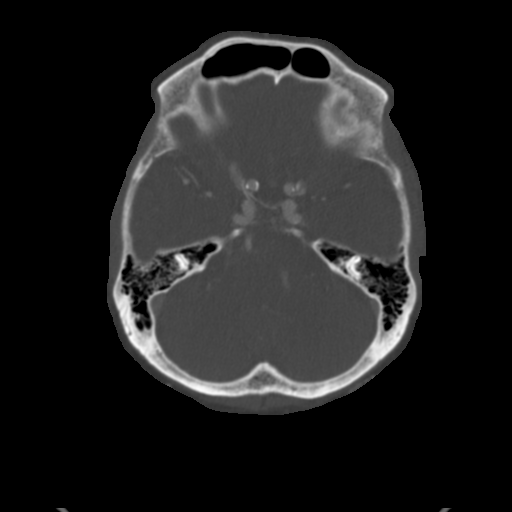

[Series 6: ax thin · axial · 0.46mm/px · z∈[+266,+507]mm · 6 of 339 slices shown]
[im 49/339  soft-tissue]
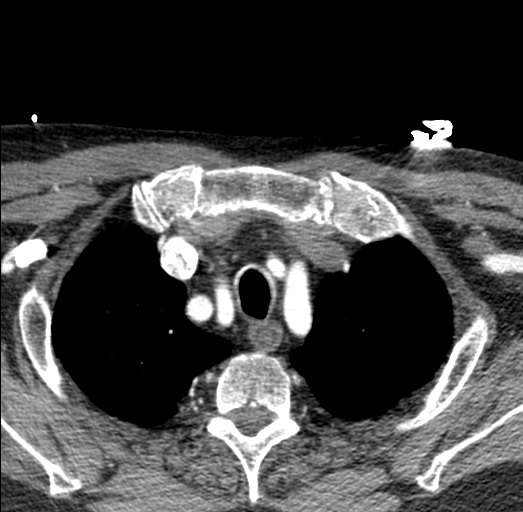
[im 97/339  soft-tissue]
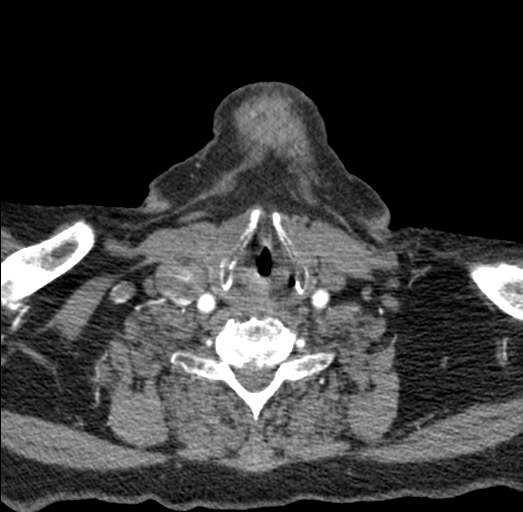
[im 145/339  soft-tissue]
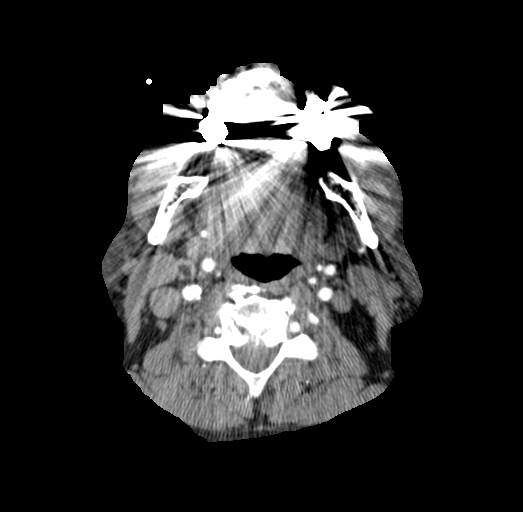
[im 194/339  soft-tissue]
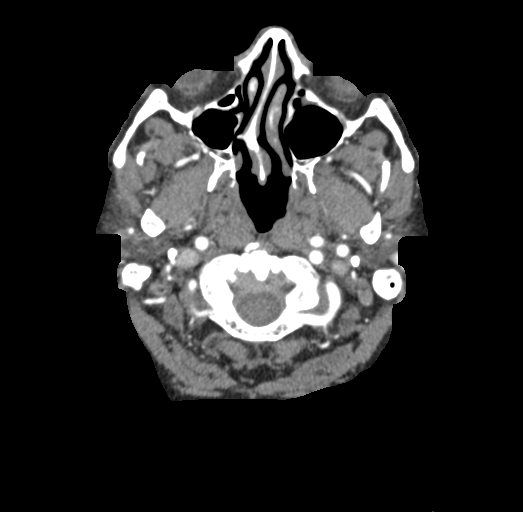
[im 242/339  soft-tissue]
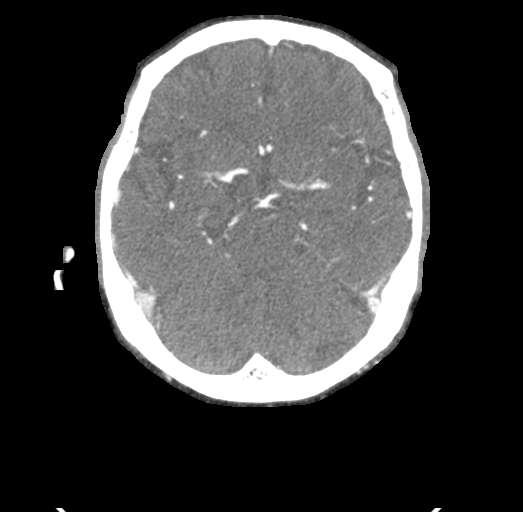
[im 290/339  soft-tissue]
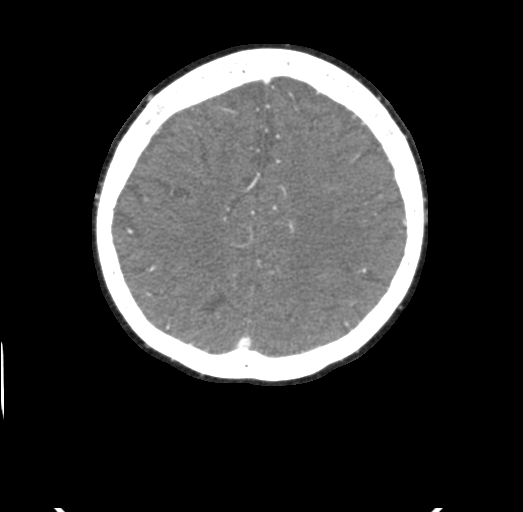

[8 of 33 positions shown; findings below may reference images not displayed]

FINDINGS: CTA NECK

Skeleton: Advanced degenerative changes in the cervical spine. No
acute osseous abnormality identified.

Upper chest: Negative.

Other neck: Negative.

Aortic arch: Ectatic aortic arch. Mild Calcified aortic
atherosclerosis. 3 vessel arch configuration.

Right carotid system: Tortuous brachiocephalic artery without
stenosis. Normal right CCA origin. Minimal plaque proximal to the
bifurcation. Mostly calcified plaque at the right ICA origin and
bulb without stenosis. Highly tortuous right ICA at the C2 level,
also with mild calcified plaque but no stenosis.

Left carotid system: Mildly tortuous proximal left CCA without
plaque or stenosis. Minimal plaque in the proximal left ICA.
Tortuous left ICA at the C1 level but no stenosis.

Vertebral arteries:
Calcified plaque in the proximal right subclavian artery without
stenosis. Minimal calcified plaque at the right vertebral artery
origin without stenosis. The right vertebral is non dominant but is
patent and normal to the skull base.

Tortuous proximal left subclavian artery with mild calcified plaque,
no stenosis. Calcified plaque near the left vertebral artery origin
but no stenosis. Tortuous left V1 segment. The left vertebral is
dominant and patent to the skull base without stenosis.

CTA HEAD

Posterior circulation: Dominant left vertebral artery supplies the
basilar with minimal calcified plaque and no stenosis. The non
dominant right vertebral artery functionally terminates in PICA on
series 6, image 216. The right PICA appears normal. Diminutive
distal right V4 segment appears normal.

The left AICA appears dominant and normal. The basilar artery is
mildly tortuous but otherwise normal. Normal basilar tip, SCA and
left PCA origin. There is a fetal type right PCA. The left posterior
communicating artery is present.

There are tandem severe stenoses of the left PCA P2 segment (series
11, image 25). Left PCA branches are otherwise within normal limits.
There is severe short segment stenosis of the right PCA distal P2 on
series 11, image 19. Right PCA branches are within normal limits.

Anterior circulation: Both ICA siphons are patent and ectatic with
mild calcified plaque. No siphon stenosis on the left. Normal left
ophthalmic and posterior communicating artery origins. No siphon
stenosis on the right. Normal right posterior communicating artery
origin, the right ophthalmic artery origin is diminutive and not
identified.

Patent carotid termini. Normal MCA and ACA origins. Mild A1 segment
tortuosity. Normal anterior communicating artery. Bilateral ACA
branches are within normal limits. Left MCA M1 segment bifurcates
early. Left M1 and left MCA branches are within normal limits. Right
MCA M1, bifurcation, and right MCA branches are within normal
limits.

Venous sinuses: Patent.

Anatomic variants: Dominant left vertebral artery which supplies the
basilar. Non dominant right vertebral artery functionally terminates
in PICA. Fetal type right PCA origin.

Review of the MIP images confirms the above findings
IMPRESSION: 1. Negative for intracranial aneurysm, vascular malformation, or
large vessel occlusion.
The right PICA, vertebrals and basilar appear normal; the right
vertebral is non-dominant and functionally terminates in the PICA.
2. Positive for severe bilateral PCA P2 segment stenoses.
3. Mild for age atherosclerosis in the neck, with no carotid or
anterior circulation stenosis.
4. Ectatic aortic arch with mild atherosclerosis.

## 2018-10-10 IMAGING — CT CT HEAD WITHOUT CONTRAST
3 of 7 series · 15 of 47 positions shown, 18 images · non-contrast
Comparison: CT head and cervical spine [DATE].

CLINICAL DATA: Right maxillary sinus pain, weakness and double
vision. Nausea. History of atrial fibrillation.

EXAM:
CT HEAD WITHOUT CONTRAST
CT MAXILLOFACIAL WITHOUT CONTRAST
TECHNIQUE: Multidetector CT imaging of the head and maxillofacial structures
were performed using the standard protocol without intravenous
contrast. Multiplanar CT image reconstructions of the maxillofacial
structures were also generated.

[Series 5: coronal soft tissue · coronal · 0.32mm/px · 2 of 62 slices shown]
[im 25/62  brain]
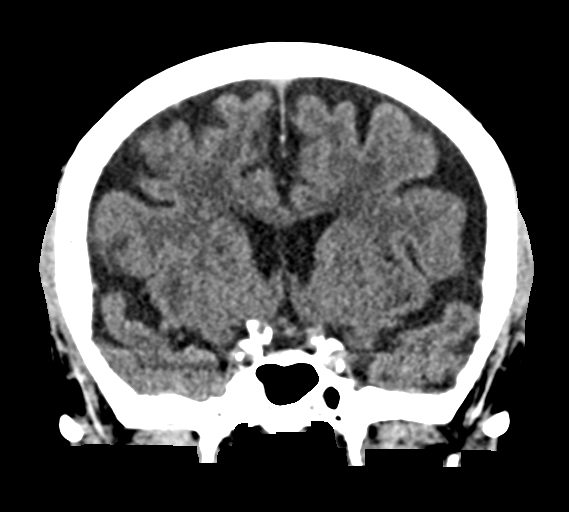
[im 50/62  brain]
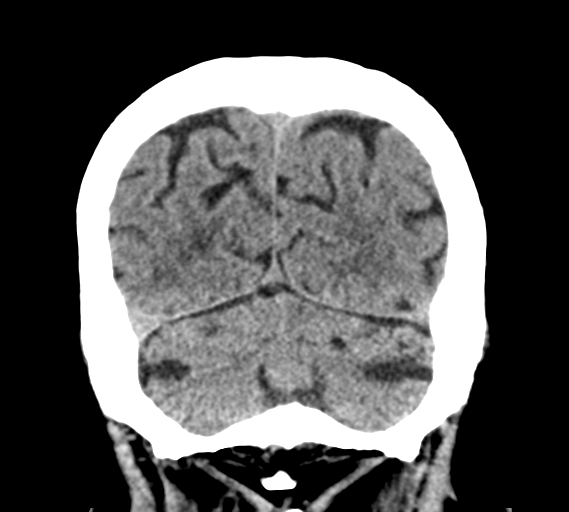

[Series 6: max soft (person_name) · axial · 0.37mm/px · z∈[-194,-60]mm · 11 of 81 slices shown, 14 images]
[im 7/81  brain]
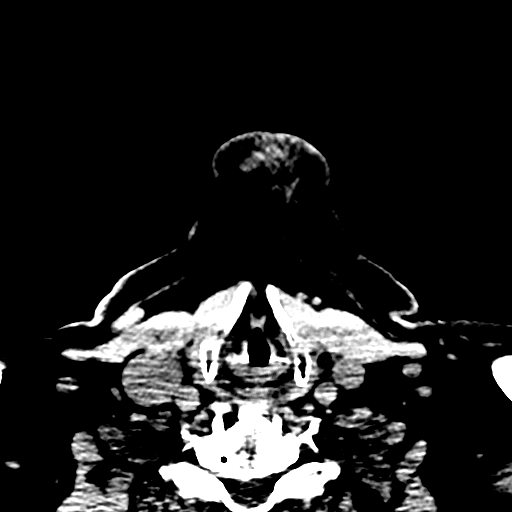
[im 7/81  bone]
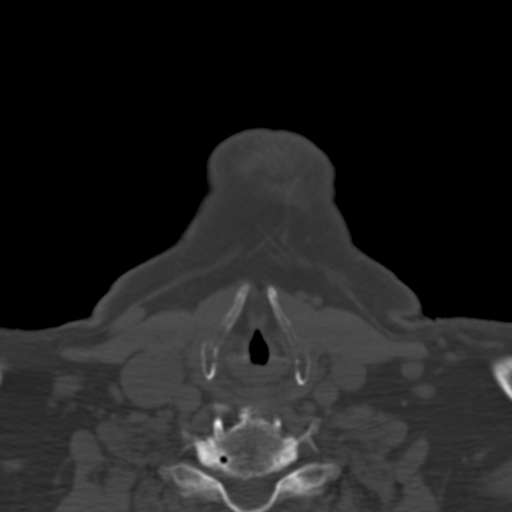
[im 14/81  brain]
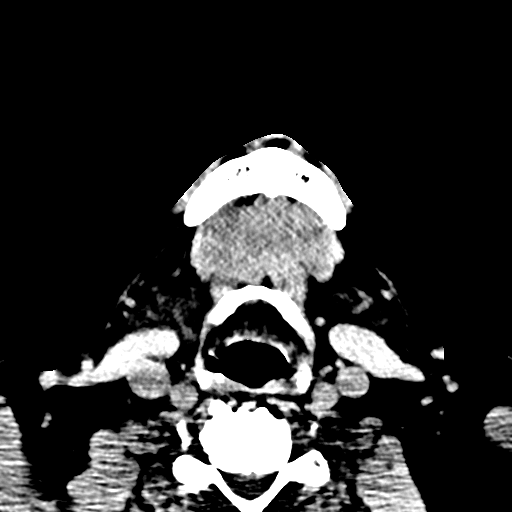
[im 21/81  brain]
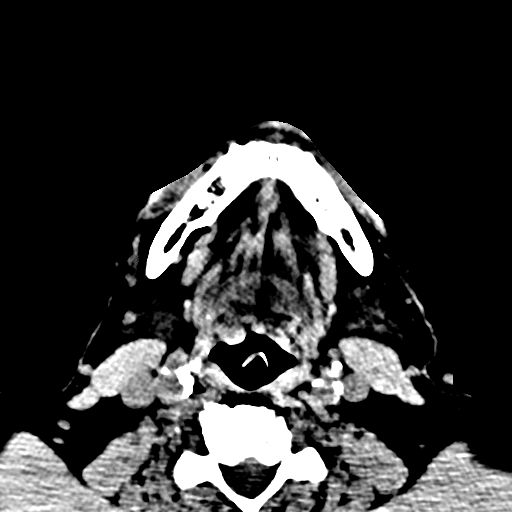
[im 27/81  brain]
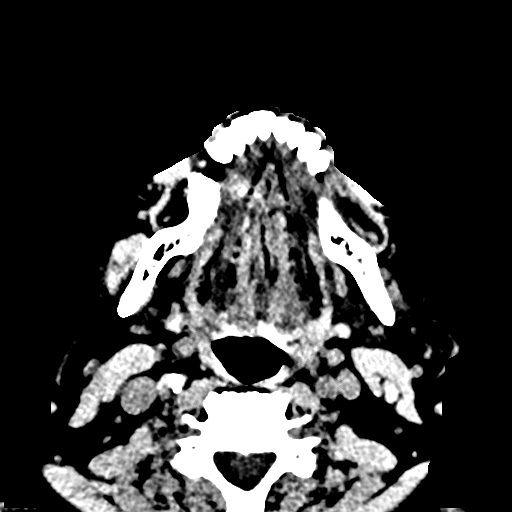
[im 34/81  brain]
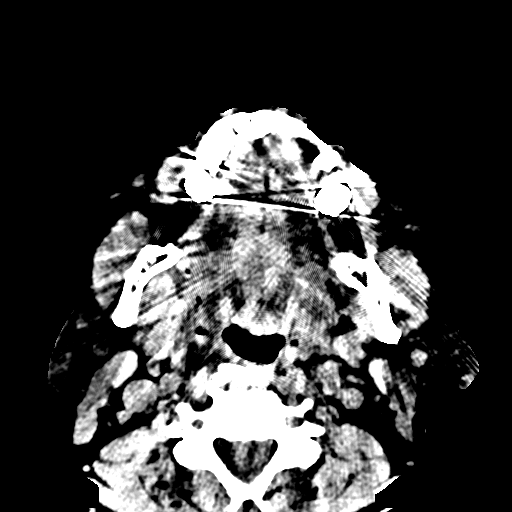
[im 34/81  bone]
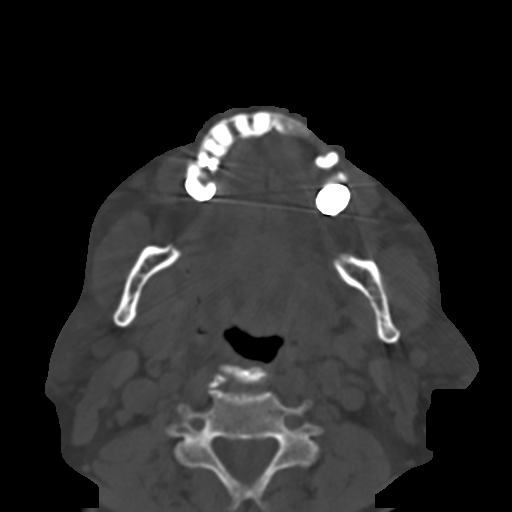
[im 41/81  brain]
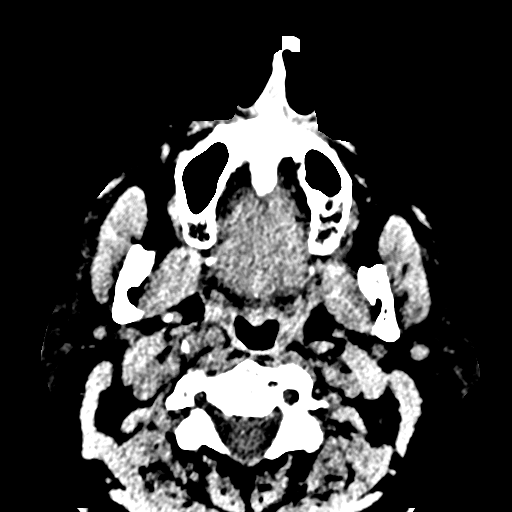
[im 47/81  brain]
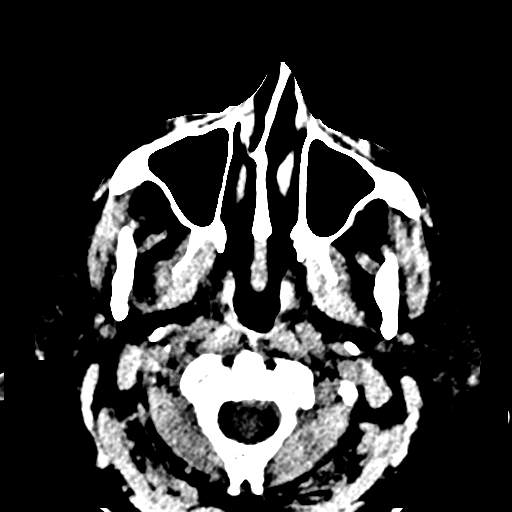
[im 54/81  brain]
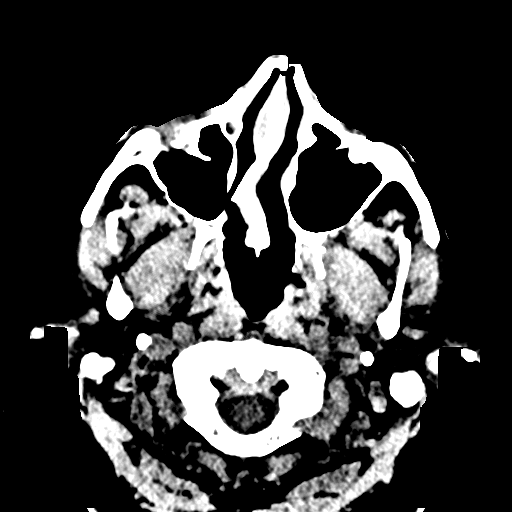
[im 61/81  brain]
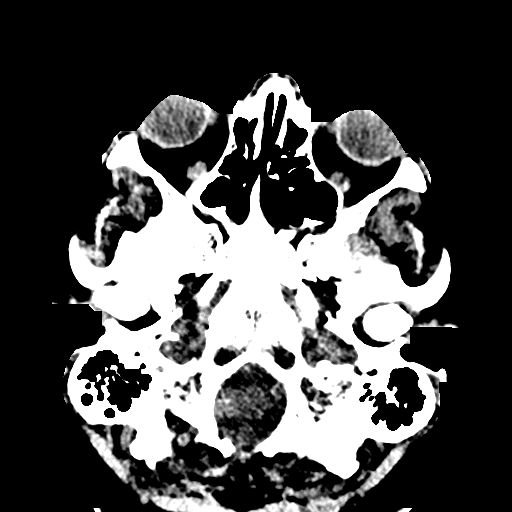
[im 61/81  bone]
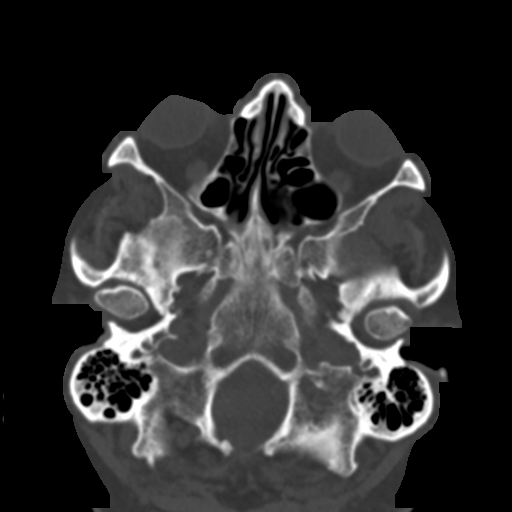
[im 67/81  brain]
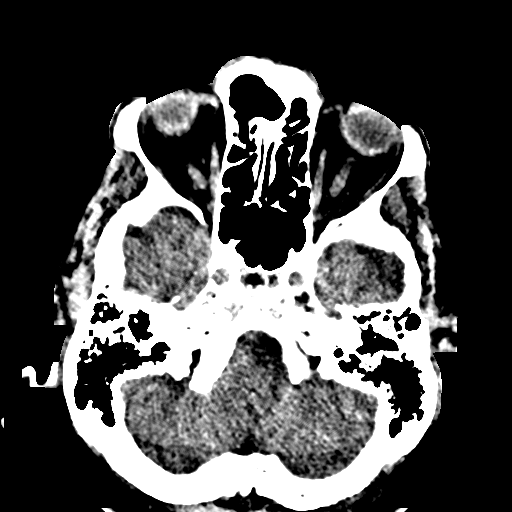
[im 74/81  brain]
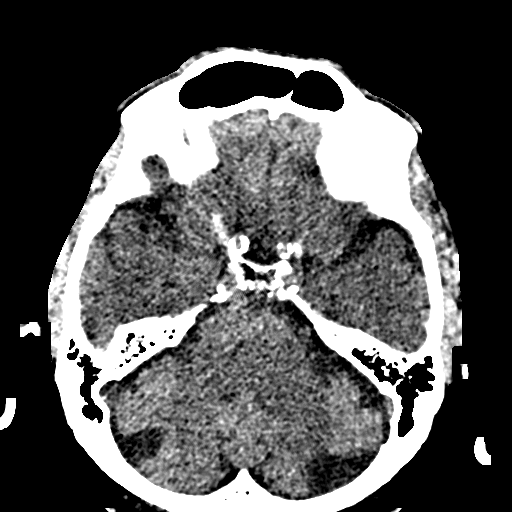

[Series 11: sagittal soft · sagittal · 0.36mm/px · 2 of 96 slices shown]
[im 32/96  brain]
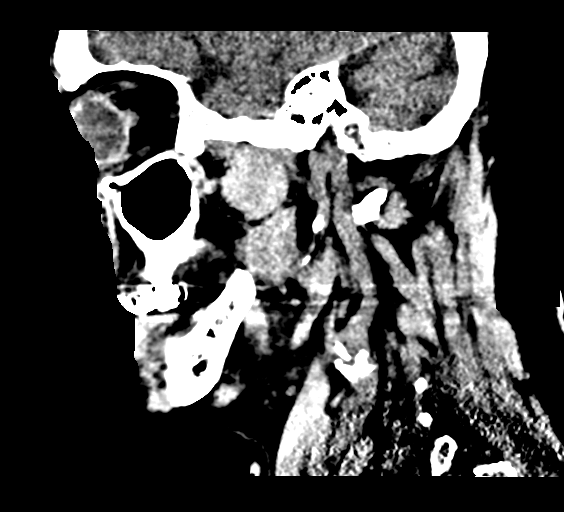
[im 64/96  brain]
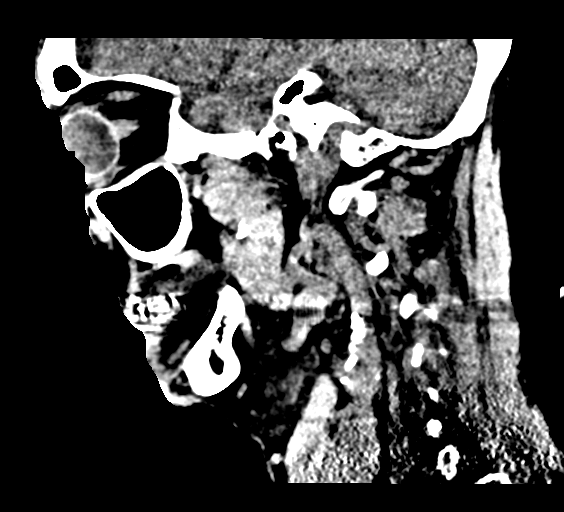

[15 of 47 positions shown; findings below may reference images not displayed]

FINDINGS: CT HEAD FINDINGS

Brain: There is probable acute extra-axial hemorrhage in the
posterior fossa along the right aspect of the pons and medulla
(images 4 through 8 of series 2). This likely represents
subarachnoid hemorrhage. There is no supratentorial hemorrhage,
hydrocephalus, brain edema or midline shift. There is atrophy with
prominence of the ventricles and subarachnoid spaces. Old lacunar
infarct in the right thalamus and basal ganglia appears stable. The
ventricles and subarachnoid spaces are appropriately sized for age.
There is no CT evidence of acute cortical infarction.

Vascular: Prominent intracranial vascular calcifications. No
hyperdense vessel identified.

Skull: Negative for fracture or focal lesion.

Sinuses/Orbits: The visualized paranasal sinuses and mastoid air
cells are clear. No orbital abnormalities are seen.

Other: None.

CT MAXILLOFACIAL FINDINGS

Osseous: No evidence of acute fracture or focal osseous lesion.

Orbits: Previous lens surgery bilaterally. No evidence of ocular
injury. The extraocular muscles and optic nerves appear normal.

Sinuses: The paranasal sinuses, mastoid air cells and middle ears
are clear without air-fluid levels.

Soft tissues: No focal soft tissue swelling or foreign body.
IMPRESSION: 1. Acute extra-axial hemorrhage along the right pons and medulla,
could be venous in origin, although a ruptured aneurysm (PICA)
cannot be excluded. This finding likely contributes to the patient's
symptoms. Recommend further evaluation with CTA head and neck.
2. No supratentorial abnormality or hydrocephalus.
3. Negative maxillofacial CT.
4. Critical Value/emergent results were called by telephone at the
time of interpretation on [DATE] at [DATE] to Dr. SORIN
, who verbally acknowledged these results.

## 2018-10-10 MED ORDER — LABETALOL HCL 5 MG/ML IV SOLN
10.0000 mg | Freq: Once | INTRAVENOUS | Status: AC
  Administered 2018-10-10: 10 mg via INTRAVENOUS
  Filled 2018-10-10: qty 4

## 2018-10-10 MED ORDER — IOHEXOL 350 MG/ML SOLN
75.0000 mL | Freq: Once | INTRAVENOUS | Status: AC | PRN
  Administered 2018-10-10: 16:00:00 75 mL via INTRAVENOUS

## 2018-10-10 MED ORDER — LABETALOL HCL 5 MG/ML IV SOLN
10.0000 mg | Freq: Once | INTRAVENOUS | Status: AC
  Administered 2018-10-10: 18:00:00 10 mg via INTRAVENOUS

## 2018-10-10 MED ORDER — LABETALOL HCL 5 MG/ML IV SOLN
20.0000 mg | Freq: Once | INTRAVENOUS | Status: AC
  Administered 2018-10-10: 20 mg via INTRAVENOUS
  Filled 2018-10-10: qty 4

## 2018-10-10 NOTE — ED Provider Notes (Addendum)
[facility] Emergency Department Provider Note  ____________________________________________   I have reviewed the triage vital signs and the nursing notes. Where available I have reviewed prior notes and, if possible and indicated, outside hospital notes.    HISTORY  Chief Complaint Visual Field Change and Facial Pain    HPI Cameron Foster. is a 78 y.o. male   Patient seen and evaluated during the coronavirus epidemic during a time with low staffing Patient does drink alcohol and regulations history of atrial fibrillation diverticulitis depression, was drinking alcohol last night none this morning, woke up this morning had some facial pain, felt like his sinus was inflamed, no bleeding, no focal numbness or weakness, felt a little unsteady on his feet, and noticed that he was having diplopia only when he looks far away and more when he looks to the right than when he looks to the left.  No trauma or recollected trauma at least, no difficulty speaking, no headache at this time, he states he is never had this happen before.  He is just concerned about the double vision.     Past Medical History:  Diagnosis Date  . Alcoholism ([place])   . Arthritis   . Atrial fibrillation ([place])    one episode  . Colon polyp   . Depression    Son died 17-Jun-2014  . Diverticulitis   . Diverticulosis 30 years  . Dysrhythmia    At Fib 1995  . GERD (gastroesophageal reflux disease)   . Hyperlipidemia   . Hypertension   . Irritable bowel syndrome   . Prostate cancer ([place]) Jun 17, 2010   treated with radiation therapy. Prostate  . Vasovagal syncope     Patient Active Problem List   Diagnosis Date Noted  . Low libido 08/30/2018  . Class 1 obesity without serious comorbidity with body mass index (BMI) of 31.0 to 31.9 in adult 05/14/2018  . Mixed hyperlipidemia 05/14/2018  . Degeneration of lumbar intervertebral disc 03/22/2018  . Osteoarthritis of hip 03/22/2018  . Trochanteric  bursitis of right hip 03/22/2018  . Status post partial colectomy 01/03/2017  . Depression, major, single episode, mild ([place]) 01/03/2017  . Vasovagal syncope 01/27/2016  . History of skin cancer 10/28/2015  . Osteoarthritis of right knee 10/01/2015  . Essential hypertension 08/25/2015  . Dyspnea on exertion 08/12/2015  . Erectile dysfunction following radiation therapy 08/04/2015  . Constipation 08/02/2015  . History of prostate cancer 01/20/2015  . Swelling of right lower extremity 01/20/2015    Past Surgical History:  Procedure Laterality Date  . [place] no stents  . CATARACT EXTRACTION, BILATERAL    . COLON RESECTION SIGMOID N/A 12/07/2016   Procedure: COLON RESECTION SIGMOID;  Surgeon: Clayburn Pert, MD;  Location: [facility] ORS;  Service: General;  Laterality: N/A;  . COLON SURGERY  11/2016   Colostomy  . COLONOSCOPY  Jun 17, 2014  . COLONOSCOPY WITH PROPOFOL N/A 05/30/2016   Procedure: COLONOSCOPY WITH PROPOFOL;  Surgeon: Lollie Sails, MD;  Location: [facility] ENDOSCOPY;  Service: Endoscopy;  Laterality: N/A;  . COLOSTOMY Left 12/07/2016   Procedure: COLOSTOMY;  Surgeon: Clayburn Pert, MD;  Location: [facility] ORS;  Service: General;  Laterality: Left;  . COLOSTOMY REVERSAL N/A 03/21/2017   Procedure: COLOSTOMY REVERSAL;  Surgeon: Clayburn Pert, MD;  Location: [facility] ORS;  Service: General;  Laterality: N/A;  . COLOSTOMY TAKEDOWN N/A 03/21/2017   Procedure: LAPAROSCOPIC COLOSTOMY TAKEDOWN;  Surgeon: Clayburn Pert, MD;  Location: [facility] ORS;  Service:  General;  Laterality: N/A;  . CYSTOSCOPY WITH STENT PLACEMENT Bilateral 03/21/2017   Procedure: CYSTOSCOPY WITH LIGHTED STENT PLACEMENT;  Surgeon: Abbie Sons, MD;  Location: [facility] ORS;  Service: Urology;  Laterality: Bilateral;  . ESOPHAGOGASTRODUODENOSCOPY    . ESOPHAGOGASTRODUODENOSCOPY N/A 05/30/2016   Procedure: ESOPHAGOGASTRODUODENOSCOPY (EGD);  Surgeon: Lollie Sails, MD;  Location: [facility]  ENDOSCOPY;  Service: Endoscopy;  Laterality: N/A;  . EYE SURGERY     cataracts  . FLEXIBLE SIGMOIDOSCOPY N/A 03/21/2017   Procedure: FLEXIBLE SIGMOIDOSCOPY;  Surgeon: Clayburn Pert, MD;  Location: [facility] ORS;  Service: General;  Laterality: N/A;  . FRACTURE SURGERY Bilateral    right arm and left wrist  . INCISION AND DRAINAGE ABSCESS N/A 12/07/2016   Procedure: DRAINAGE  OF INTRA ABDOMINAL ABSCESS;  Surgeon: Clayburn Pert, MD;  Location: [facility] ORS;  Service: General;  Laterality: N/A;  . KNEE ARTHROSCOPY    . LAPAROTOMY N/A 12/07/2016   Procedure: EXPLORATORY LAPAROTOMY;  Surgeon: Clayburn Pert, MD;  Location: [facility] ORS;  Service: General;  Laterality: N/A;  . PROSTATE SURGERY     Microwave therapy  . TONSILLECTOMY    . TOTAL KNEE ARTHROPLASTY Right 07/27/2016   Procedure: TOTAL KNEE ARTHROPLASTY;  Surgeon: [name], MD;  Location: [facility] ORS;  Service: Orthopedics;  Laterality: Right;  Dr. Erlene Quan had to place Urinary catheter due to prostate cancer history.  Using flexible scope.    Prior to Admission medications   Medication Sig Start Date End Date Taking? Authorizing Provider  amLODipine (NORVASC) 5 MG tablet TAKE 1 TABLET BY MOUTH EVERY DAY 02/15/18   Leone Haven, MD  docusate sodium (STOOL SOFTENER) 100 MG capsule Take 100 mg by mouth 2 (two) times daily.    [provider]  fluticasone (FLONASE) 50 MCG/ACT nasal spray Place 1 spray into both nostrils daily as needed for allergies or rhinitis.    [provider]  furosemide (LASIX) 20 MG tablet Take 1 tablet (20 mg total) by mouth daily. 05/02/18 07/31/18  End, Harrell Gave, MD  hydrocortisone 2.5 % cream APPLY CREAM TO AFFECTED AREA TWICE DAILY AS NEEDED FOR RASH 05/28/18   Bacigalupo, Dionne Bucy, MD  lisinopril (PRINIVIL,ZESTRIL) 10 MG tablet TAKE 1 TABLET BY MOUTH EVERY DAY 07/06/18   Bacigalupo, Dionne Bucy, MD  liver oil-zinc oxide (DESITIN) 40 % ointment Apply 1 application topically daily as needed for  irritation.    [provider]  meloxicam (MOBIC) 15 MG tablet Take 15 mg by mouth daily.  04/19/18   [provider]  nebivolol (BYSTOLIC) 10 MG tablet Take 1 tablet (10 mg total) by mouth daily. 05/02/18   End, Harrell Gave, MD  omeprazole (PRILOSEC) 20 MG capsule Take 20 mg by mouth daily as needed (heartburn).     [provider]  sertraline (ZOLOFT) 100 MG tablet Take 1 tablet (100 mg total) by mouth daily. 05/30/18   [name], MD    Allergies Augmentin [amoxicillin-pot clavulanate], Formaldehyde, and Tape  Family History  Problem Relation Age of Onset  . Lung cancer Father        smoker  . Other Mother   . Sudden death Son        due to "Blood clots"  . Bipolar disorder Son   . Kidney disease Daughter        congenital one small kidney  . Prostate cancer Neg Hx   . Bladder Cancer Neg Hx     Social History Social History   Tobacco Use  .  Smoking status: Former Smoker    Packs/day: 1.00    Years: 27.00    Pack years: 27.00    Types: Cigarettes    Quit date: 04/18/1988    Years since quitting: 30.4  . Smokeless tobacco: Former Systems developer    Types: Chew  Substance Use Topics  . Alcohol use: Yes    Alcohol/week: 3.0 standard drinks    Types: 3 Standard drinks or equivalent per week    Comment: Occasional- Former Heavy ETOH Use  . Drug use: No    Review of Systems Constitutional: No fever/chills Eyes: + visual changes. ENT: No sore throat. No stiff neck no neck pain Cardiovascular: Denies chest pain. Respiratory: Denies shortness of breath. Gastrointestinal:   no vomiting.  No diarrhea.  No constipation. Genitourinary: Negative for dysuria. Musculoskeletal: Negative lower extremity swelling Skin: Negative for rash. Neurological: Negative for severe headaches, focal weakness or numbness.   ____________________________________________   PHYSICAL EXAM:  VITAL SIGNS: ED Triage Vitals  Enc Vitals Group     BP 10/10/18 1340 (!)  164/103     Pulse Rate 10/10/18 1340 75     Resp 10/10/18 1340 16     Temp 10/10/18 1340 98.3 F (36.8 C)     Temp Source 10/10/18 1340 Oral     SpO2 10/10/18 1340 97 %     Weight 10/10/18 1341 210 lb (95.3 kg)     Height 10/10/18 1341 5' 9"  (1.753 m)     Head Circumference --      Peak Flow --      Pain Score 10/10/18 1341 3     Pain Loc --      Pain Edu? --      Excl. in [place]? --     Constitutional: Alert and oriented. Well appearing and in no acute distress. 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 Mucous membranes are moist.  Oropharynx non-erythematous Neck:   Nontender with no meningismus, no masses, no stridor Cardiovascular: Normal rate, regular rhythm. Grossly normal heart sounds.  Good peripheral circulation. Respiratory: Normal respiratory effort.  No retractions. Lungs CTAB. Abdominal: Soft and nontender. No distention. No guarding no rebound Back:  There is no focal tenderness or step off.  there is no midline tenderness there are no lesions noted. there is no CVA tenderness Musculoskeletal:  no lower extremity tenderness, no upper extremity tenderness. No joint effusions, no DVT signs strong distal pulses no edema Neurologic: With the exception of the 6th cranial nerve on the right, cranial nerves are intact, speech is normal, no 7th nerve palsy, no 3rd nerve palsy noted, finger-to-nose within normal limits speech is normal normal speech and language. No gross focal neurologic deficits are appreciated.  Some mild degree of nystagmus in the right eye as well greater than the left Skin:  Skin is warm, dry and intact. No rash noted. Psychiatric: Mood and affect are normal. Speech and behavior are normal.  ____________________________________________   LABS (all labs ordered are listed, but only abnormal results are displayed)  Labs Reviewed  COMPREHENSIVE  METABOLIC PANEL - Abnormal; Notable for the following components:      Result Value   Potassium 3.4 (*)    Glucose, Bld 129 (*)    All other components within normal limits  APTT - Abnormal; Notable for the following components:   aPTT 37 (*)    All other components within normal limits  CBC WITH DIFFERENTIAL/PLATELET  ETHANOL  PROTIME-INR  URINALYSIS, COMPLETE (UACMP) WITH MICROSCOPIC    Pertinent labs  results that were available during my care of the patient were reviewed by me and considered in my medical decision making (see chart for details). ____________________________________________  EKG  I personally interpreted any EKGs ordered by me or triage  sinus rhythm rate 74 bpm no acute ST elevation or depression nonspecific ST changes LAD noted. ____________________________________________  RADIOLOGY  Pertinent labs & imaging results that were available during my care of the patient were reviewed by me and considered in my medical decision making (see chart for details). If possible, patient and/or family made aware of any abnormal findings.  No results found. ____________________________________________    PROCEDURES  Procedure(s) performed: None  Procedures  Critical Care performed: None  ____________________________________________   INITIAL IMPRESSION / ASSESSMENT AND PLAN / ED COURSE  Pertinent labs & imaging results that were available during my care of the patient were reviewed by me and considered in my medical decision making (see chart for details).  Here with no evidence of visual field cut but he does appear to have a abducens nerve palsy on the right, or probably the lateral rectus muscle.  Any event, he is otherwise neurologically intact this happened sometime during the night he is outside the window for TPA if it is a CVA.  We are obtaining CT scan of the head and face as he did complain of facial pain before this started although there is no  evidence of trauma, no evidence of ocular trauma, and we will talk to ophthalmology after we get results.  ----------------------------------------- 3:20 PM on 10/10/2018 -----------------------------------------  Called at this time by radiologist, appreciate their call, they do see a trace of blood in the brainstem they are worried about the possibility of a P-comm aneurysm versus a traumatic  But unecollected fall or other trauma.  That has been ordered.  Patient remains without any other complaint. Signed out to Dr. Joni Fears at the end of my shift.    ____________________________________________   FINAL CLINICAL IMPRESSION(S) / ED DIAGNOSES  Final diagnoses:  None      This chart was dictated using voice recognition software.  Despite best efforts to proofread,  errors can occur which can change meaning.      Schuyler Amor, MD 10/10/18 1455    Schuyler Amor, MD 10/10/18 [id number]

## 2018-10-10 NOTE — ED Notes (Signed)
Dr. Joni Fears at bedside updating patient

## 2018-10-10 NOTE — ED Triage Notes (Signed)
Pt reports waking up with double vision this AM. Went to bed normal last night. C/o pain to right maxillary sinus.  Feels weak all over.  When closes each eye separate he notes no vision changes but when both open double vision noted. Also c/o nausea.  afib noted but denies being on blood thinner or knowing about afib hx.

## 2018-10-10 NOTE — ED Notes (Signed)
Attempted to get urine from pt, pt unable at this time

## 2018-10-10 NOTE — ED Notes (Signed)
PT in CT.

## 2018-10-10 NOTE — ED Provider Notes (Signed)
-----------------------------------------   4:51 PM on 10/10/2018 -----------------------------------------   CT angiogram negative for aneurysm or apparent active bleeding.  Discussed with Dr. Cari Caraway by phone who advises that with the subarachnoid hemorrhage the patient would need to be transferred to [facility] for further evaluation.  Case discussed with Dr. Britta Mccreedy of [facility] neuro ICU who accepts for transfer.  Patient updated about diagnosis and plan of care.  Discussed with [facility] transfer coordinator regarding screening for COVID.  He has no risk factors or symptoms, and per [facility] protocols he does not require COVID testing for transfer.  Last blood pressure check was about 180/110, so I will give 10 of IV labetalol.  ----------------------------------------- 6:49 PM on 10/10/2018 -----------------------------------------  Patient again having elevated blood pressure of about 170/110.  After two doses of 11m IV.  Heart rate of 85.  We will continue using escalating doses of IV labetalol, 20 mg IV for now.  .Critical Care Performed by: SCarrie Mew MD Authorized by: SCarrie Mew MD   Critical care provider statement:    Critical care time (minutes):  35   Critical care time was exclusive of:  Separately billable procedures and treating other patients   Critical care was time spent personally by me on the following activities:  Development of treatment plan with patient or surrogate, discussions with consultants, evaluation of patient's response to treatment, examination of patient, obtaining history from patient or surrogate, ordering and performing treatments and interventions, ordering and review of laboratory studies, ordering and review of radiographic studies, pulse oximetry, re-evaluation of patient's condition and review of old charts      Final diagnoses:  Sixth nerve palsy of right eye  Subarachnoid hematoma, without loss of consciousness, initial encounter ([facility]       SCarrie Mew MD 10/14/18 [phone]

## 2018-10-10 NOTE — ED Notes (Signed)
Answered pt call bell. Pt requested personal cell phone charger to be plugged into outlet. Asked to see if pt needed anything else. Pt resting comfortably.

## 2018-10-11 DIAGNOSIS — F17200 Nicotine dependence, unspecified, uncomplicated: Secondary | ICD-10-CM | POA: Diagnosis present

## 2018-10-11 DIAGNOSIS — M47812 Spondylosis without myelopathy or radiculopathy, cervical region: Secondary | ICD-10-CM | POA: Diagnosis not present

## 2018-10-11 DIAGNOSIS — Z8679 Personal history of other diseases of the circulatory system: Secondary | ICD-10-CM | POA: Diagnosis not present

## 2018-10-11 DIAGNOSIS — M1990 Unspecified osteoarthritis, unspecified site: Secondary | ICD-10-CM | POA: Diagnosis present

## 2018-10-11 DIAGNOSIS — F329 Major depressive disorder, single episode, unspecified: Secondary | ICD-10-CM | POA: Diagnosis present

## 2018-10-11 DIAGNOSIS — Z79899 Other long term (current) drug therapy: Secondary | ICD-10-CM | POA: Diagnosis not present

## 2018-10-11 DIAGNOSIS — I4891 Unspecified atrial fibrillation: Secondary | ICD-10-CM | POA: Diagnosis present

## 2018-10-11 DIAGNOSIS — I10 Essential (primary) hypertension: Secondary | ICD-10-CM | POA: Diagnosis not present

## 2018-10-11 DIAGNOSIS — K589 Irritable bowel syndrome without diarrhea: Secondary | ICD-10-CM | POA: Diagnosis present

## 2018-10-11 DIAGNOSIS — Z20828 Contact with and (suspected) exposure to other viral communicable diseases: Secondary | ICD-10-CM | POA: Diagnosis present

## 2018-10-11 DIAGNOSIS — H532 Diplopia: Secondary | ICD-10-CM | POA: Diagnosis present

## 2018-10-11 DIAGNOSIS — Z66 Do not resuscitate: Secondary | ICD-10-CM | POA: Diagnosis present

## 2018-10-11 DIAGNOSIS — I618 Other nontraumatic intracerebral hemorrhage: Secondary | ICD-10-CM | POA: Diagnosis not present

## 2018-10-11 DIAGNOSIS — I609 Nontraumatic subarachnoid hemorrhage, unspecified: Secondary | ICD-10-CM | POA: Diagnosis not present

## 2018-10-11 DIAGNOSIS — R29700 NIHSS score 0: Secondary | ICD-10-CM | POA: Diagnosis present

## 2018-10-11 DIAGNOSIS — K210 Gastro-esophageal reflux disease with esophagitis: Secondary | ICD-10-CM | POA: Diagnosis present

## 2018-10-11 DIAGNOSIS — M4802 Spinal stenosis, cervical region: Secondary | ICD-10-CM | POA: Diagnosis not present

## 2018-10-11 LAB — TSH: TSH: 2.49 (ref <=5.90)

## 2018-10-11 LAB — HEMOGLOBIN A1C: Hemoglobin A1C: 5.3

## 2018-10-11 NOTE — ED Notes (Signed)
Patient made aware of transfer to [facility].

## 2018-10-11 NOTE — ED Notes (Signed)
Patient placed in regular hospital bed for comfort.

## 2018-10-11 NOTE — ED Notes (Signed)
[facility] arrived to transport pt at this time

## 2018-10-11 NOTE — ED Notes (Signed)
Report given to [facility] life flight. Pick to be after 0700 am

## 2018-10-11 NOTE — ED Notes (Signed)
ED TO INPATIENT HANDOFF REPORT  ED Nurse Name and Phone #: Joelene Millin [phone]  S Name/Age/Gender [name] 78 y.o. male Room/Bed: ED03A/ED03A  Code Status   Code Status: Prior  Home/SNF/Other Home Patient oriented to: self, place, time and situation Is this baseline? Yes   Triage Complete: Triage complete  Chief Complaint blurred vision abd pain  Triage Note Pt reports waking up with double vision this AM. Went to bed normal last night. C/o pain to right maxillary sinus.  Feels weak all over.  When closes each eye separate he notes no vision changes but when both open double vision noted. Also c/o nausea.  afib noted but denies being on blood thinner or knowing about afib hx.   Allergies Allergies  Allergen Reactions  . Augmentin [Amoxicillin-Pot Clavulanate] Other (See Comments)    Abdominal upset Has patient had a PCN reaction causing immediate rash, facial/tongue/throat swelling, SOB or lightheadedness with hypotension: Unknown Has patient had a PCN reaction causing severe rash involving mucus membranes or skin necrosis: Unknown Has patient had a PCN reaction that required hospitalization: Unknown Has patient had a PCN reaction occurring within the last 10 years: Unknown If all of the above answers are "NO", then may proceed with Cephalosporin use.   . Formaldehyde Rash  . Tape Rash    Pt states he is not allergic to tape.    Level of Care/Admitting Diagnosis ED Disposition    ED Disposition Condition Comment   Transfer to Another Facility  The patient appears reasonably stabilized for transfer considering the current resources, flow, and capabilities available in the ED at this time, and I doubt any other [facility] requiring further screening and/or treatment in the ED prior to transfer is p resent.       B Medical/Surgery History Past Medical History:  Diagnosis Date  . Alcoholism ([place])   . Arthritis   . Atrial fibrillation ([place])    one episode  .  Colon polyp   . Depression    Son died 2014-06-24  . Diverticulitis   . Diverticulosis 30 years  . Dysrhythmia    At Fib 1995  . GERD (gastroesophageal reflux disease)   . Hyperlipidemia   . Hypertension   . Irritable bowel syndrome   . Prostate cancer ([place]) 06/24/10   treated with radiation therapy. Prostate  . Vasovagal syncope    Past Surgical History:  Procedure Laterality Date  . [place] no stents  . CATARACT EXTRACTION, BILATERAL    . COLON RESECTION SIGMOID N/A 12/07/2016   Procedure: COLON RESECTION SIGMOID;  Surgeon: Clayburn Pert, MD;  Location: [facility] ORS;  Service: General;  Laterality: N/A;  . COLON SURGERY  11/2016   Colostomy  . COLONOSCOPY  2014-06-24  . COLONOSCOPY WITH PROPOFOL N/A 05/30/2016   Procedure: COLONOSCOPY WITH PROPOFOL;  Surgeon: Lollie Sails, MD;  Location: [facility] ENDOSCOPY;  Service: Endoscopy;  Laterality: N/A;  . COLOSTOMY Left 12/07/2016   Procedure: COLOSTOMY;  Surgeon: Clayburn Pert, MD;  Location: [facility] ORS;  Service: General;  Laterality: Left;  . COLOSTOMY REVERSAL N/A 03/21/2017   Procedure: COLOSTOMY REVERSAL;  Surgeon: Clayburn Pert, MD;  Location: [facility] ORS;  Service: General;  Laterality: N/A;  . COLOSTOMY TAKEDOWN N/A 03/21/2017   Procedure: LAPAROSCOPIC COLOSTOMY TAKEDOWN;  Surgeon: Clayburn Pert, MD;  Location: [facility] ORS;  Service: General;  Laterality: N/A;  . CYSTOSCOPY WITH STENT PLACEMENT Bilateral 03/21/2017   Procedure: CYSTOSCOPY WITH LIGHTED STENT PLACEMENT;  Surgeon: Bernardo Heater,  Ronda Fairly, MD;  Location: [facility] ORS;  Service: Urology;  Laterality: Bilateral;  . ESOPHAGOGASTRODUODENOSCOPY    . ESOPHAGOGASTRODUODENOSCOPY N/A 05/30/2016   Procedure: ESOPHAGOGASTRODUODENOSCOPY (EGD);  Surgeon: Lollie Sails, MD;  Location: [facility] ENDOSCOPY;  Service: Endoscopy;  Laterality: N/A;  . EYE SURGERY     cataracts  . FLEXIBLE SIGMOIDOSCOPY N/A 03/21/2017   Procedure: FLEXIBLE SIGMOIDOSCOPY;  Surgeon: Clayburn Pert, MD;  Location: [facility] ORS;  Service: General;  Laterality: N/A;  . FRACTURE SURGERY Bilateral    right arm and left wrist  . INCISION AND DRAINAGE ABSCESS N/A 12/07/2016   Procedure: DRAINAGE  OF INTRA ABDOMINAL ABSCESS;  Surgeon: Clayburn Pert, MD;  Location: [facility] ORS;  Service: General;  Laterality: N/A;  . KNEE ARTHROSCOPY    . LAPAROTOMY N/A 12/07/2016   Procedure: EXPLORATORY LAPAROTOMY;  Surgeon: Clayburn Pert, MD;  Location: [facility] ORS;  Service: General;  Laterality: N/A;  . PROSTATE SURGERY     Microwave therapy  . TONSILLECTOMY    . TOTAL KNEE ARTHROPLASTY Right 07/27/2016   Procedure: TOTAL KNEE ARTHROPLASTY;  Surgeon: [name], MD;  Location: [facility] ORS;  Service: Orthopedics;  Laterality: Right;  Dr. Erlene Quan had to place Urinary catheter due to prostate cancer history.  Using flexible scope.     A IV Location/Drains/Wounds Patient Lines/Drains/Airways Status   Active Line/Drains/Airways    Name:   Placement date:   Placement time:   Site:   Days:   Peripheral IV 10/10/18 Right Wrist   10/10/18    1530    Wrist   1   Colostomy LLQ   12/07/16    1800    LLQ   673   Ureteral Drain/Stent Left ureter 6 Fr.   03/21/17    0810    Left ureter   569   Incision (Closed) 12/07/16 Abdomen Mid   12/07/16    1745     673   Incision (Closed) 03/21/17 Perineum Other (Comment)   03/21/17    0913     569   Incision (Closed) 03/21/17 Rectum Other (Comment)   03/21/17    0913     569   Incision (Closed) 03/21/17 Abdomen Right   03/21/17    1332     569   Incision (Closed) 03/21/17 Abdomen Left   03/21/17    1332     569   Incision - 3 Ports Abdomen Right;Upper Right;Mid Right;Lower   03/21/17    0855     569          Intake/Output Last 24 hours No intake or output data in the 24 hours ending 10/11/18 0618  Labs/Imaging Results for orders placed or performed during the hospital encounter of 10/10/18 (from the past 48 hour(s))  CBC with Differential     Status: None    Collection Time: 10/10/18  1:55 PM  Result Value Ref Range   WBC 6.9 4.0 - 10.5 K/uL   RBC 4.67 4.22 - 5.81 MIL/uL   Hemoglobin 13.0 13.0 - 17.0 g/dL   HCT 41.0 39.0 - 52.0 %   MCV 87.8 80.0 - 100.0 fL   MCH 27.8 26.0 - 34.0 pg   MCHC 31.7 30.0 - 36.0 g/dL   RDW 13.9 11.5 - 15.5 %   Platelets 242 150 - 400 K/uL   nRBC 0.0 0.0 - 0.2 %   Neutrophils Relative % 81 %   Neutro Abs 5.6 1.7 - 7.7 K/uL   Lymphocytes Relative 11 %  Lymphs Abs 0.8 0.7 - 4.0 K/uL   Monocytes Relative 7 %   Monocytes Absolute 0.5 0.1 - 1.0 K/uL   Eosinophils Relative 1 %   Eosinophils Absolute 0.0 0.0 - 0.5 K/uL   Basophils Relative 0 %   Basophils Absolute 0.0 0.0 - 0.1 K/uL   Immature Granulocytes 0 %   Abs Immature Granulocytes 0.02 0.00 - 0.07 K/uL    Comment: Performed at [facility], [address] [place], [place] [address]  Ethanol     Status: None   Collection Time: 10/10/18  1:55 PM  Result Value Ref Range   Alcohol, Ethyl (B) <10 <10 mg/dL    Comment: (NOTE) Lowest detectable limit for serum alcohol is 10 mg/dL. For medical purposes only. Performed at [facility], [address] [place], [place] [address]   Comprehensive metabolic panel     Status: Abnormal   Collection Time: 10/10/18  1:55 PM  Result Value Ref Range   Sodium 137 135 - 145 mmol/L   Potassium 3.4 (L) 3.5 - 5.1 mmol/L   Chloride 102 98 - 111 mmol/L   CO2 25 22 - 32 mmol/L   Glucose, Bld 129 (H) 70 - 99 mg/dL   BUN 11 8 - 23 mg/dL   Creatinine, Ser 1.12 0.61 - 1.24 mg/dL   Calcium 9.3 8.9 - 10.3 mg/dL   Total Protein 8.0 6.5 - 8.1 g/dL   Albumin 4.0 3.5 - 5.0 g/dL   AST 20 15 - 41 U/L   ALT 10 0 - 44 U/L   Alkaline Phosphatase 75 38 - 126 U/L   Total Bilirubin 0.8 0.3 - 1.2 mg/dL   GFR calc non Af Amer >60 >60 mL/min   GFR calc Af Amer >60 >60 mL/min   Anion gap 10 5 - 15    Comment: Performed at [facility], [address] [place], [place] [address]  Protime-INR     Status:  None   Collection Time: 10/10/18  1:55 PM  Result Value Ref Range   Prothrombin Time 13.6 11.4 - 15.2 seconds   INR 1.1 0.8 - 1.2    Comment: (NOTE) INR goal varies based on device and disease states. Performed at [facility], [address] [place], [place] [address]   APTT     Status: Abnormal   Collection Time: 10/10/18  1:55 PM  Result Value Ref Range   aPTT 37 (H) 24 - 36 seconds    Comment:        IF BASELINE aPTT IS ELEVATED, SUGGEST PATIENT RISK ASSESSMENT BE USED TO DETERMINE APPROPRIATE ANTICOAGULANT THERAPY. Performed at [name], [address] [place], [place] [address]   Urinalysis, Complete w Microscopic     Status: Abnormal   Collection Time: 10/10/18  2:45 PM  Result Value Ref Range   Color, Urine YELLOW (A) YELLOW   APPearance CLEAR (A) CLEAR   Specific Gravity, Urine 1.013 1.005 - 1.030   pH 6.0 5.0 - 8.0   Glucose, UA NEGATIVE NEGATIVE mg/dL   Hgb urine dipstick SMALL (A) NEGATIVE   Bilirubin Urine NEGATIVE NEGATIVE   Ketones, ur NEGATIVE NEGATIVE mg/dL   Protein, ur 30 (A) NEGATIVE mg/dL   Nitrite NEGATIVE NEGATIVE   Leukocytes,Ua NEGATIVE NEGATIVE   RBC / HPF 0-5 0 - 5 RBC/hpf   WBC, UA 0-5 0 - 5 WBC/hpf   Bacteria, UA NONE SEEN NONE SEEN   Squamous Epithelial / LPF NONE SEEN 0 -  5   Mucus PRESENT    Hyaline Casts, UA PRESENT     Comment: Performed at [facility], [place], [place] [address]   Ct Angio Head W Or Wo Contrast  Result Date: 10/10/2018 CLINICAL DATA:  78 year old male with acute extra-axial hemorrhage in the posterior fossa on noncontrast head CT today. EXAM: CT ANGIOGRAPHY HEAD AND NECK TECHNIQUE: Multidetector CT imaging of the head and neck was performed using the standard protocol during bolus administration of intravenous contrast. Multiplanar CT image reconstructions and MIPs were obtained to evaluate the vascular anatomy. Carotid stenosis measurements (when applicable) are  obtained utilizing NASCET criteria, using the distal internal carotid diameter as the denominator. CONTRAST:  63m OMNIPAQUE IOHEXOL 350 MG/ML SOLN COMPARISON:  Head and face CT 1415 hours today. FINDINGS: CTA NECK Skeleton: Advanced degenerative changes in the cervical spine. No acute osseous abnormality identified. Upper chest: Negative. Other neck: Negative. Aortic arch: Ectatic aortic arch. Mild Calcified aortic atherosclerosis. 3 vessel arch configuration. Right carotid system: Tortuous brachiocephalic artery without stenosis. Normal right CCA origin. Minimal plaque proximal to the bifurcation. Mostly calcified plaque at the right ICA origin and bulb without stenosis. Highly tortuous right ICA at the C2 level, also with mild calcified plaque but no stenosis. Left carotid system: Mildly tortuous proximal left CCA without plaque or stenosis. Minimal plaque in the proximal left ICA. Tortuous left ICA at the C1 level but no stenosis. Vertebral arteries: Calcified plaque in the proximal right subclavian artery without stenosis. Minimal calcified plaque at the right vertebral artery origin without stenosis. The right vertebral is non dominant but is patent and normal to the skull base. Tortuous proximal left subclavian artery with mild calcified plaque, no stenosis. Calcified plaque near the left vertebral artery origin but no stenosis. Tortuous left V1 segment. The left vertebral is dominant and patent to the skull base without stenosis. CTA HEAD Posterior circulation: Dominant left vertebral artery supplies the basilar with minimal calcified plaque and no stenosis. The non dominant right vertebral artery functionally terminates in PICA on series 6, image 216. The right PICA appears normal. Diminutive distal right V4 segment appears normal. The left AICA appears dominant and normal. The basilar artery is mildly tortuous but otherwise normal. Normal basilar tip, SCA and left PCA origin. There is a fetal type right  PCA. The left posterior communicating artery is present. There are tandem severe stenoses of the left PCA P2 segment (series 11, image 25). Left PCA branches are otherwise within normal limits. There is severe short segment stenosis of the right PCA distal P2 on series 11, image 19. Right PCA branches are within normal limits. Anterior circulation: Both ICA siphons are patent and ectatic with mild calcified plaque. No siphon stenosis on the left. Normal left ophthalmic and posterior communicating artery origins. No siphon stenosis on the right. Normal right posterior communicating artery origin, the right ophthalmic artery origin is diminutive and not identified. Patent carotid termini. Normal MCA and ACA origins. Mild A1 segment tortuosity. Normal anterior communicating artery. Bilateral ACA branches are within normal limits. Left MCA M1 segment bifurcates early. Left M1 and left MCA branches are within normal limits. Right MCA M1, bifurcation, and right MCA branches are within normal limits. Venous sinuses: Patent. Anatomic variants: Dominant left vertebral artery which supplies the basilar. Non dominant right vertebral artery functionally terminates in PICA. Fetal type right PCA origin. Review of the MIP images confirms the above findings IMPRESSION: 1. Negative for intracranial aneurysm, vascular malformation, or  large vessel occlusion. The right PICA, vertebrals and basilar appear normal; the right vertebral is non-dominant and functionally terminates in the PICA. 2. Positive for severe bilateral PCA P2 segment stenoses. 3. Mild for age atherosclerosis in the neck, with no carotid or anterior circulation stenosis. 4. Ectatic aortic arch with mild atherosclerosis. Electronically Signed   By: Genevie Ann M.D.   On: 10/10/2018 16:03   Ct Head Wo Contrast  Result Date: 10/10/2018 CLINICAL DATA:  Right maxillary sinus pain, weakness and double vision. Nausea. History of atrial fibrillation. EXAM: CT HEAD WITHOUT  CONTRAST CT MAXILLOFACIAL WITHOUT CONTRAST TECHNIQUE: Multidetector CT imaging of the head and maxillofacial structures were performed using the standard protocol without intravenous contrast. Multiplanar CT image reconstructions of the maxillofacial structures were also generated. COMPARISON:  CT head and cervical spine 06/15/2016. FINDINGS: CT HEAD FINDINGS Brain: There is probable acute extra-axial hemorrhage in the posterior fossa along the right aspect of the pons and medulla (images 4 through 8 of series 2). This likely represents subarachnoid hemorrhage. There is no supratentorial hemorrhage, hydrocephalus, brain edema or midline shift. There is atrophy with prominence of the ventricles and subarachnoid spaces. Old lacunar infarct in the right thalamus and basal ganglia appears stable. The ventricles and subarachnoid spaces are appropriately sized for age. There is no CT evidence of acute cortical infarction. Vascular: Prominent intracranial vascular calcifications. No hyperdense vessel identified. Skull: Negative for fracture or focal lesion. Sinuses/Orbits: The visualized paranasal sinuses and mastoid air cells are clear. No orbital abnormalities are seen. Other: None. CT MAXILLOFACIAL FINDINGS Osseous: No evidence of acute fracture or focal osseous lesion. Orbits: Previous lens surgery bilaterally. No evidence of ocular injury. The extraocular muscles and optic nerves appear normal. Sinuses: The paranasal sinuses, mastoid air cells and middle ears are clear without air-fluid levels. Soft tissues: No focal soft tissue swelling or foreign body. IMPRESSION: 1. Acute extra-axial hemorrhage along the right pons and medulla, could be venous in origin, although a ruptured aneurysm (PICA) cannot be excluded. This finding likely contributes to the patient's symptoms. Recommend further evaluation with CTA head and neck. 2. No supratentorial abnormality or hydrocephalus. 3. Negative maxillofacial CT. 4. Critical  Value/emergent results were called by telephone at the time of interpretation on 10/10/2018 at 3:10 pm to Dr. Charlotte Crumb , who verbally acknowledged these results. Electronically Signed   By: Richardean Sale M.D.   On: 10/10/2018 15:12   Ct Angio Neck W And/or Wo Contrast  Result Date: 10/10/2018 CLINICAL DATA:  78 year old male with acute extra-axial hemorrhage in the posterior fossa on noncontrast head CT today. EXAM: CT ANGIOGRAPHY HEAD AND NECK TECHNIQUE: Multidetector CT imaging of the head and neck was performed using the standard protocol during bolus administration of intravenous contrast. Multiplanar CT image reconstructions and MIPs were obtained to evaluate the vascular anatomy. Carotid stenosis measurements (when applicable) are obtained utilizing NASCET criteria, using the distal internal carotid diameter as the denominator. CONTRAST:  19m OMNIPAQUE IOHEXOL 350 MG/ML SOLN COMPARISON:  Head and face CT 1415 hours today. FINDINGS: CTA NECK Skeleton: Advanced degenerative changes in the cervical spine. No acute osseous abnormality identified. Upper chest: Negative. Other neck: Negative. Aortic arch: Ectatic aortic arch. Mild Calcified aortic atherosclerosis. 3 vessel arch configuration. Right carotid system: Tortuous brachiocephalic artery without stenosis. Normal right CCA origin. Minimal plaque proximal to the bifurcation. Mostly calcified plaque at the right ICA origin and bulb without stenosis. Highly tortuous right ICA at the C2 level, also with mild calcified plaque but no  stenosis. Left carotid system: Mildly tortuous proximal left CCA without plaque or stenosis. Minimal plaque in the proximal left ICA. Tortuous left ICA at the C1 level but no stenosis. Vertebral arteries: Calcified plaque in the proximal right subclavian artery without stenosis. Minimal calcified plaque at the right vertebral artery origin without stenosis. The right vertebral is non dominant but is patent and normal to the  skull base. Tortuous proximal left subclavian artery with mild calcified plaque, no stenosis. Calcified plaque near the left vertebral artery origin but no stenosis. Tortuous left V1 segment. The left vertebral is dominant and patent to the skull base without stenosis. CTA HEAD Posterior circulation: Dominant left vertebral artery supplies the basilar with minimal calcified plaque and no stenosis. The non dominant right vertebral artery functionally terminates in PICA on series 6, image 216. The right PICA appears normal. Diminutive distal right V4 segment appears normal. The left AICA appears dominant and normal. The basilar artery is mildly tortuous but otherwise normal. Normal basilar tip, SCA and left PCA origin. There is a fetal type right PCA. The left posterior communicating artery is present. There are tandem severe stenoses of the left PCA P2 segment (series 11, image 25). Left PCA branches are otherwise within normal limits. There is severe short segment stenosis of the right PCA distal P2 on series 11, image 19. Right PCA branches are within normal limits. Anterior circulation: Both ICA siphons are patent and ectatic with mild calcified plaque. No siphon stenosis on the left. Normal left ophthalmic and posterior communicating artery origins. No siphon stenosis on the right. Normal right posterior communicating artery origin, the right ophthalmic artery origin is diminutive and not identified. Patent carotid termini. Normal MCA and ACA origins. Mild A1 segment tortuosity. Normal anterior communicating artery. Bilateral ACA branches are within normal limits. Left MCA M1 segment bifurcates early. Left M1 and left MCA branches are within normal limits. Right MCA M1, bifurcation, and right MCA branches are within normal limits. Venous sinuses: Patent. Anatomic variants: Dominant left vertebral artery which supplies the basilar. Non dominant right vertebral artery functionally terminates in PICA. Fetal type  right PCA origin. Review of the MIP images confirms the above findings IMPRESSION: 1. Negative for intracranial aneurysm, vascular malformation, or large vessel occlusion. The right PICA, vertebrals and basilar appear normal; the right vertebral is non-dominant and functionally terminates in the PICA. 2. Positive for severe bilateral PCA P2 segment stenoses. 3. Mild for age atherosclerosis in the neck, with no carotid or anterior circulation stenosis. 4. Ectatic aortic arch with mild atherosclerosis. Electronically Signed   By: Genevie Ann M.D.   On: 10/10/2018 16:03   Ct Maxillofacial Wo Contrast  Result Date: 10/10/2018 CLINICAL DATA:  Right maxillary sinus pain, weakness and double vision. Nausea. History of atrial fibrillation. EXAM: CT HEAD WITHOUT CONTRAST CT MAXILLOFACIAL WITHOUT CONTRAST TECHNIQUE: Multidetector CT imaging of the head and maxillofacial structures were performed using the standard protocol without intravenous contrast. Multiplanar CT image reconstructions of the maxillofacial structures were also generated. COMPARISON:  CT head and cervical spine 06/15/2016. FINDINGS: CT HEAD FINDINGS Brain: There is probable acute extra-axial hemorrhage in the posterior fossa along the right aspect of the pons and medulla (images 4 through 8 of series 2). This likely represents subarachnoid hemorrhage. There is no supratentorial hemorrhage, hydrocephalus, brain edema or midline shift. There is atrophy with prominence of the ventricles and subarachnoid spaces. Old lacunar infarct in the right thalamus and basal ganglia appears stable. The ventricles and subarachnoid spaces are  appropriately sized for age. There is no CT evidence of acute cortical infarction. Vascular: Prominent intracranial vascular calcifications. No hyperdense vessel identified. Skull: Negative for fracture or focal lesion. Sinuses/Orbits: The visualized paranasal sinuses and mastoid air cells are clear. No orbital abnormalities are seen.  Other: None. CT MAXILLOFACIAL FINDINGS Osseous: No evidence of acute fracture or focal osseous lesion. Orbits: Previous lens surgery bilaterally. No evidence of ocular injury. The extraocular muscles and optic nerves appear normal. Sinuses: The paranasal sinuses, mastoid air cells and middle ears are clear without air-fluid levels. Soft tissues: No focal soft tissue swelling or foreign body. IMPRESSION: 1. Acute extra-axial hemorrhage along the right pons and medulla, could be venous in origin, although a ruptured aneurysm (PICA) cannot be excluded. This finding likely contributes to the patient's symptoms. Recommend further evaluation with CTA head and neck. 2. No supratentorial abnormality or hydrocephalus. 3. Negative maxillofacial CT. 4. Critical Value/emergent results were called by telephone at the time of interpretation on 10/10/2018 at 3:10 pm to Dr. Charlotte Crumb , who verbally acknowledged these results. Electronically Signed   By: Richardean Sale M.D.   On: 10/10/2018 15:12    Pending Labs Unresulted Labs (From admission, onward)   None      Vitals/Pain Today's Vitals   10/11/18 0330 10/11/18 0345 10/11/18 0400 10/11/18 0415  BP: (!) 156/92  139/84   Pulse: 70 68 68 70  Resp: 20 (!) 22 15 18   Temp:      TempSrc:      SpO2: 95% 95% 94% 95%  Weight:      Height:      PainSc:        Isolation Precautions No active isolations  Medications Medications  iohexol (OMNIPAQUE) 350 MG/ML injection 75 mL (75 mLs Intravenous Contrast Given 10/10/18 1535)  labetalol (NORMODYNE) injection 10 mg (10 mg Intravenous Given 10/10/18 1655)  labetalol (NORMODYNE) injection 10 mg (10 mg Intravenous Given 10/10/18 1755)  labetalol (NORMODYNE) injection 20 mg (20 mg Intravenous Given 10/10/18 1853)    Mobility walks Low fall risk   Focused Assessments Neuro Assessment Handoff:  Swallow screen pass? Yes    NIH Stroke Scale ( + Modified Stroke Scale Criteria)  Interval: Initial Level of  Consciousness (1a.)   : Alert, keenly responsive LOC Questions (1b. )   +: Answers both questions correctly LOC Commands (1c. )   + : Performs both tasks correctly Best Gaze (2. )  +: Partial gaze palsy Visual (3. )  +: No visual loss Facial Palsy (4. )    : Normal symmetrical movements Motor Arm, Left (5a. )   +: No drift Motor Arm, Right (5b. )   +: No drift Motor Leg, Left (6a. )   +: No drift Motor Leg, Right (6b. )   +: No drift Limb Ataxia (7. ): Absent Sensory (8. )   +: Normal, no sensory loss Best Language (9. )   +: No aphasia Dysarthria (10. ): Normal Extinction/Inattention (11.)   +: No Abnormality Modified SS Total  +: 1 Complete NIHSS TOTAL: 1     Neuro Assessment:   Neuro Checks:   Initial (10/10/18 1357)  Last Documented NIHSS Modified Score: 1 (10/10/18 1357) Has TPA been given? No If patient is a Neuro Trauma and patient is going to OR before floor call report to [facility] nurse: [phone] or [phone]     R Recommendations: See Admitting Provider Note  Report given to:   Additional Notes:

## 2018-10-11 NOTE — ED Provider Notes (Signed)
-----------------------------------------   4:17 AM on 10/11/2018 -----------------------------------------   Blood pressure (!) 140/96, pulse 74, temperature 98.4 F (36.9 C), temperature source Oral, resp. rate 19, height 1.753 m (5' 9" ), weight 95.3 kg, SpO2 97 %.  Bed will not be available at [facility] until after 7 AM.  Patient is stable.   Hinda Kehr, MD 10/11/18 [phone]

## 2018-10-12 LAB — BASIC METABOLIC PANEL
BUN: 13 (ref 4–21)
CO2: 23 — AB (ref 13–22)
Chloride: 105 (ref 99–108)
Creatinine: 1.4 — AB (ref 0.6–1.3)
Glucose: 102
Potassium: 3.6 (ref 3.4–5.3)
Sodium: 134 — AB (ref 137–147)

## 2018-10-12 LAB — CBC AND DIFFERENTIAL
HCT: 36 — AB (ref 41–53)
Hemoglobin: 11.9 — AB (ref 13.5–17.5)
Platelets: 232 (ref 150–399)
WBC: 9.1

## 2018-10-12 LAB — CBC: RBC: 4.08 (ref 3.87–5.11)

## 2018-10-12 LAB — COMPREHENSIVE METABOLIC PANEL: Calcium: 9 (ref 8.7–10.7)

## 2018-10-13 MED ORDER — AMLODIPINE BESYLATE 5 MG PO TABS
5.00 | ORAL_TABLET | ORAL | Status: DC
Start: 2018-10-13 — End: 2018-10-13

## 2018-10-13 MED ORDER — FOLIC ACID 1 MG PO TABS
1.00 | ORAL_TABLET | ORAL | Status: DC
Start: 2018-10-13 — End: 2018-10-13

## 2018-10-13 MED ORDER — POLYETHYLENE GLYCOL 3350 17 G PO PACK
17.00 | PACK | ORAL | Status: DC
Start: 2018-10-13 — End: 2018-10-13

## 2018-10-13 MED ORDER — GENERIC EXTERNAL MEDICATION
Status: DC
Start: ? — End: 2018-10-13

## 2018-10-13 MED ORDER — PANTOPRAZOLE SODIUM 40 MG PO TBEC
40.00 | DELAYED_RELEASE_TABLET | ORAL | Status: DC
Start: 2018-10-13 — End: 2018-10-13

## 2018-10-13 MED ORDER — LABETALOL HCL 5 MG/ML IV SOLN
10.00 | INTRAVENOUS | Status: DC
Start: ? — End: 2018-10-13

## 2018-10-13 MED ORDER — NICOTINE POLACRILEX 2 MG MT LOZG
2.00 | LOZENGE | OROMUCOSAL | Status: DC
Start: ? — End: 2018-10-13

## 2018-10-13 MED ORDER — SENNOSIDES-DOCUSATE SODIUM 8.6-50 MG PO TABS
1.00 | ORAL_TABLET | ORAL | Status: DC
Start: 2018-10-13 — End: 2018-10-13

## 2018-10-13 MED ORDER — METOPROLOL SUCCINATE ER 25 MG PO TB24
25.00 | ORAL_TABLET | ORAL | Status: DC
Start: 2018-10-13 — End: 2018-10-13

## 2018-10-13 MED ORDER — ENOXAPARIN SODIUM 40 MG/0.4ML ~~LOC~~ SOLN
40.00 | SUBCUTANEOUS | Status: DC
Start: 2018-10-13 — End: 2018-10-13

## 2018-10-13 MED ORDER — SERTRALINE HCL 50 MG PO TABS
50.00 | ORAL_TABLET | ORAL | Status: DC
Start: 2018-10-13 — End: 2018-10-13

## 2018-10-13 MED ORDER — HYDRALAZINE HCL 20 MG/ML IJ SOLN
10.00 | INTRAMUSCULAR | Status: DC
Start: ? — End: 2018-10-13

## 2018-10-14 ENCOUNTER — Emergency Department: Payer: Medicare Other

## 2018-10-14 ENCOUNTER — Emergency Department
Admission: EM | Admit: 2018-10-14 | Discharge: 2018-10-14 | Disposition: A | Discharge status: Home / Self Care | Payer: Medicare Other | Attending: Emergency Medicine | Admitting: Emergency Medicine

## 2018-10-14 ENCOUNTER — Other Ambulatory Visit: Payer: Self-pay

## 2018-10-14 DIAGNOSIS — G44209 Tension-type headache, unspecified, not intractable: Secondary | ICD-10-CM | POA: Diagnosis not present

## 2018-10-14 DIAGNOSIS — Z87891 Personal history of nicotine dependence: Secondary | ICD-10-CM | POA: Diagnosis not present

## 2018-10-14 DIAGNOSIS — Z96651 Presence of right artificial knee joint: Secondary | ICD-10-CM | POA: Diagnosis not present

## 2018-10-14 DIAGNOSIS — I10 Essential (primary) hypertension: Secondary | ICD-10-CM | POA: Diagnosis not present

## 2018-10-14 DIAGNOSIS — R51 Headache: Secondary | ICD-10-CM | POA: Diagnosis not present

## 2018-10-14 DIAGNOSIS — Z79899 Other long term (current) drug therapy: Secondary | ICD-10-CM | POA: Insufficient documentation

## 2018-10-14 DIAGNOSIS — R519 Headache, unspecified: Secondary | ICD-10-CM

## 2018-10-14 IMAGING — CT CT HEAD WITHOUT CONTRAST
3 of 4 series · 15 of 47 positions shown, 18 images · non-contrast
Comparison: [DATE]

CLINICAL DATA: Recent subarachnoid hemorrhage. Headache and
lightheadedness since last night.

EXAM:
CT HEAD WITHOUT CONTRAST
TECHNIQUE: Contiguous axial images were obtained from the base of the skull
through the vertex without intravenous contrast.

[Series 3: head wo · axial · 0.47mm/px · z∈[-154,-29]mm · 9 of 30 slices shown, 12 images]
[im 3/30  brain]
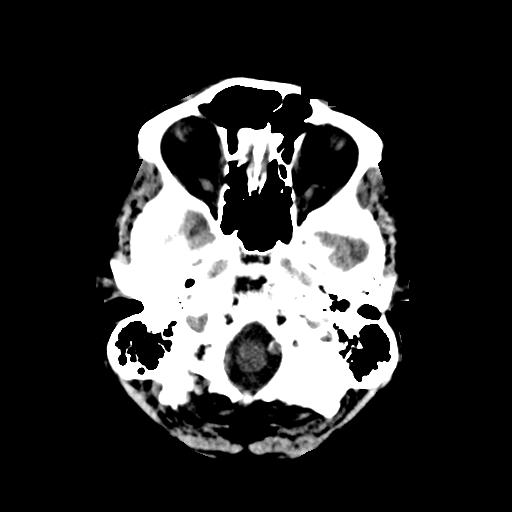
[im 3/30  bone]
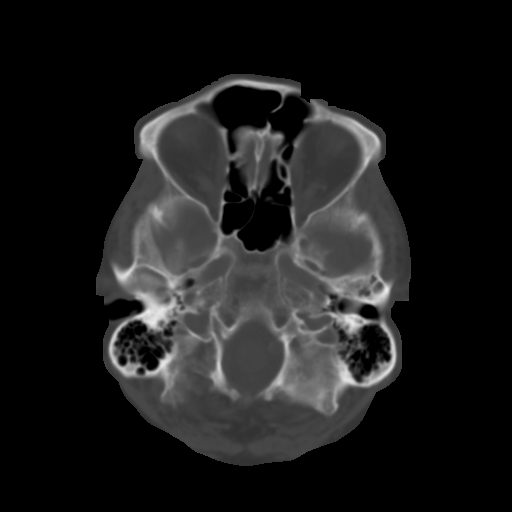
[im 6/30  brain]
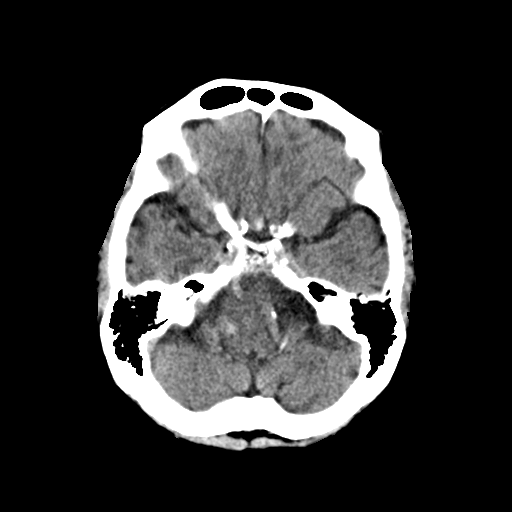
[im 9/30  brain]
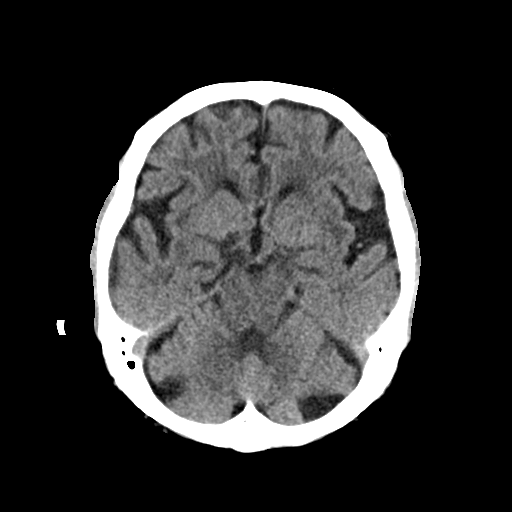
[im 12/30  brain]
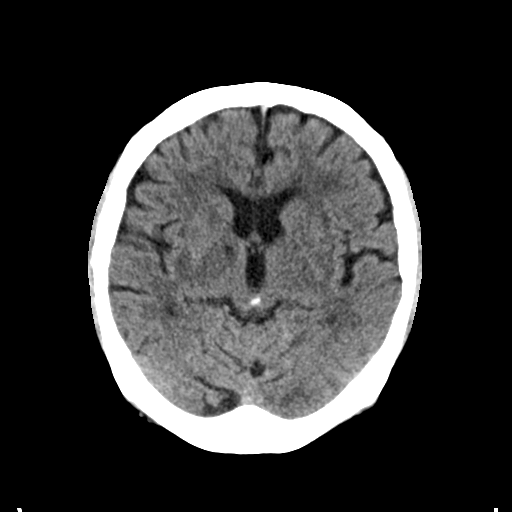
[im 16/30  brain]
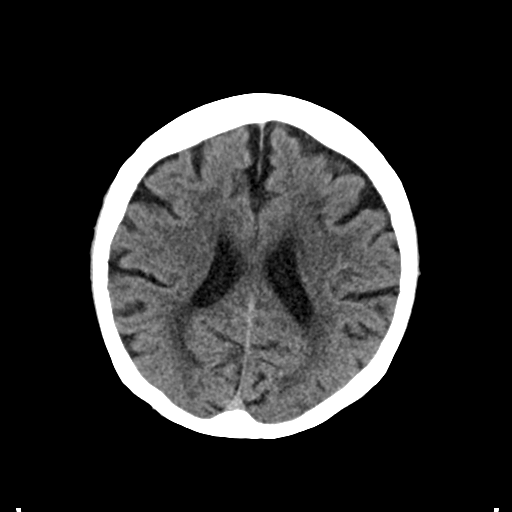
[im 16/30  bone]
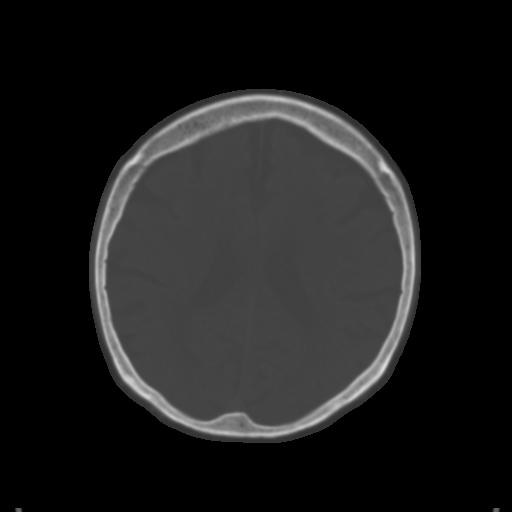
[im 19/30  brain]
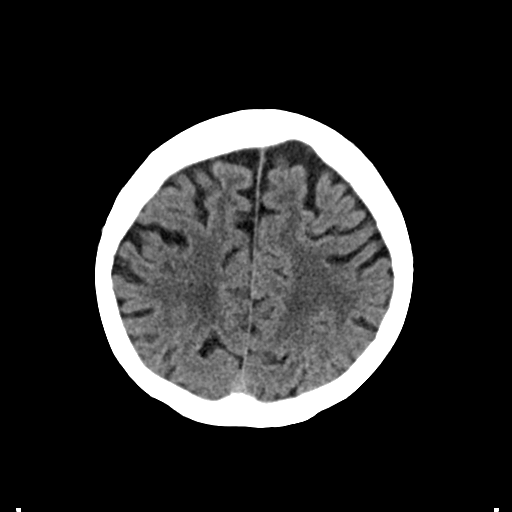
[im 22/30  brain]
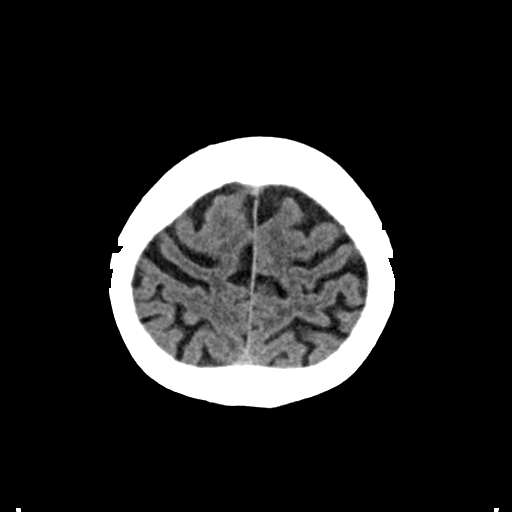
[im 25/30  brain]
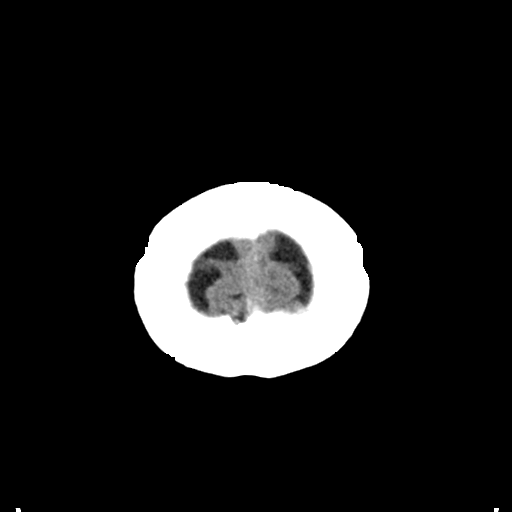
[im 28/30  brain]
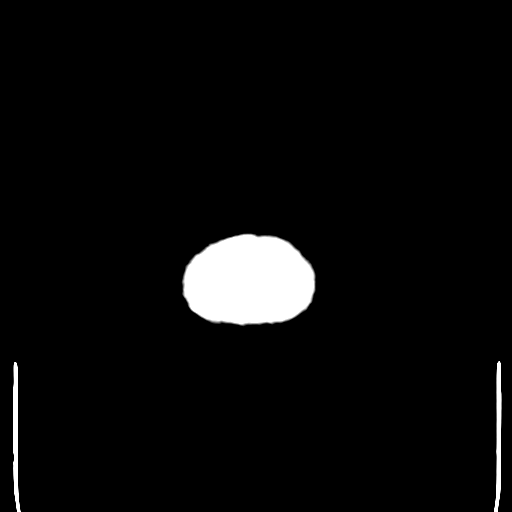
[im 28/30  bone]
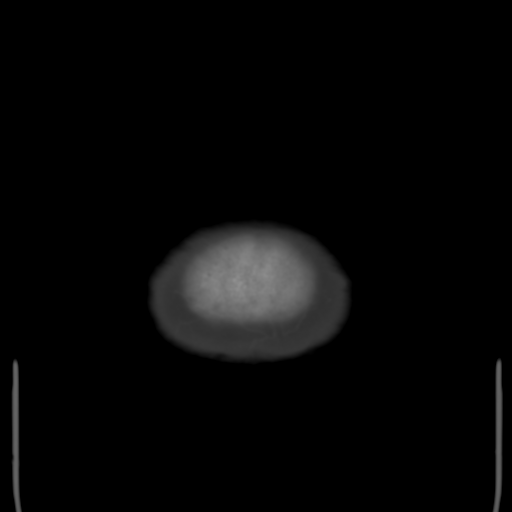

[Series 4: coronal soft tissue · coronal · 0.28mm/px · 3 of 63 slices shown]
[im 16/63  brain]
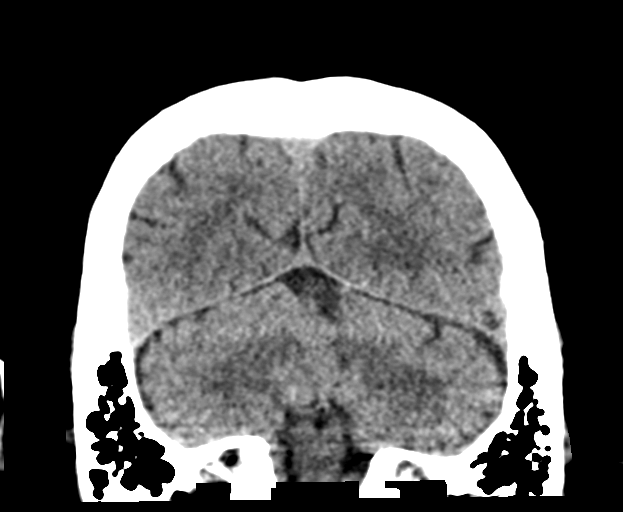
[im 32/63  brain]
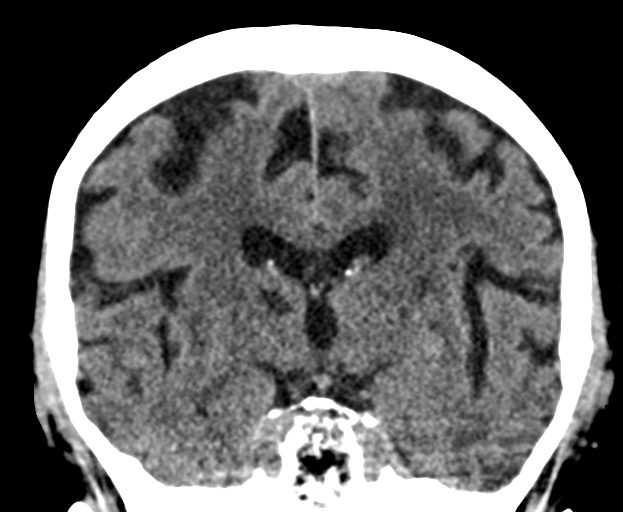
[im 47/63  brain]
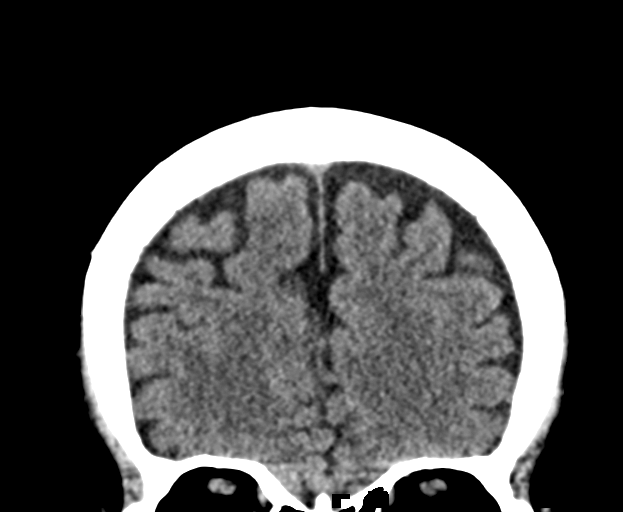

[Series 6: sagittal soft tissue...... · sagittal · 0.28mm/px · 3 of 59 slices shown]
[im 20/59  brain]
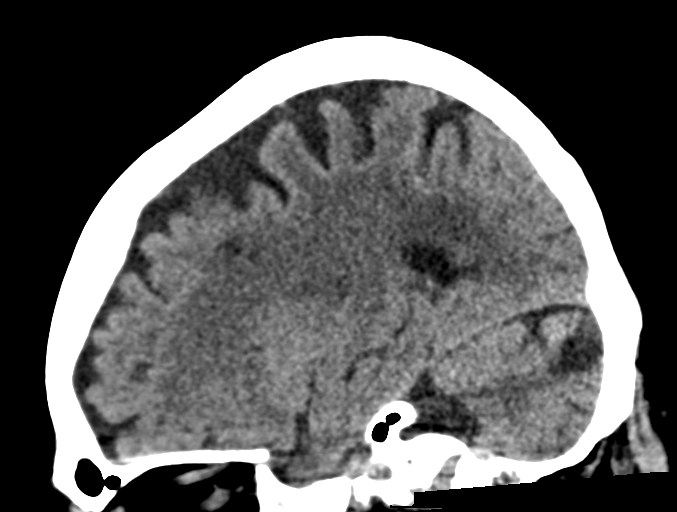
[im 30/59  brain]
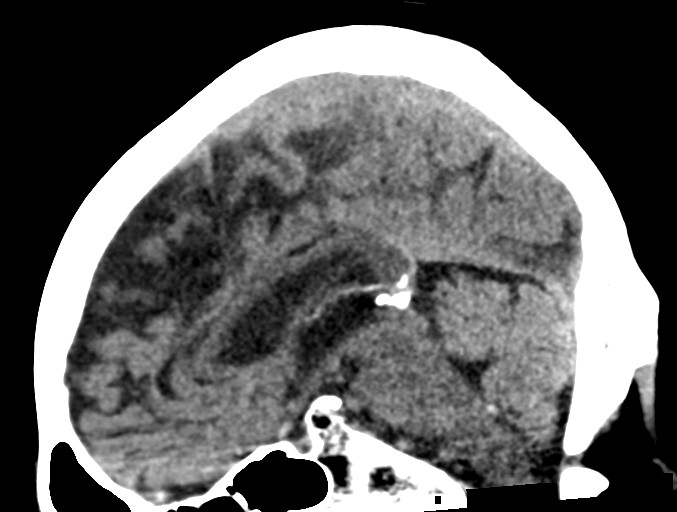
[im 39/59  brain]
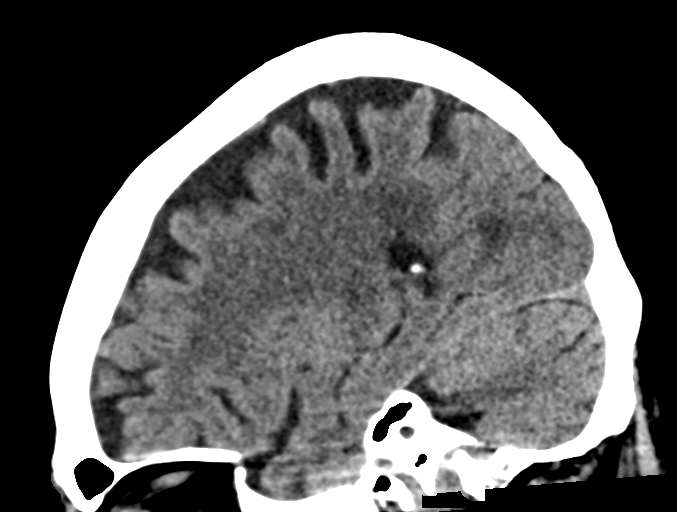

[15 of 47 positions shown; findings below may reference images not displayed]

FINDINGS: Brain: Hyperdense material along the right lateral aspect of the
medulla and lower pons on the prior CT suggesting extra-axial
hemorrhage has decreased. No new intracranial hemorrhage, acute
infarct, midline shift, or extra-axial fluid collection is
identified. Chronic lacunar infarcts are again seen in the right
thalamus and cerebellum. Bilateral cerebral white matter
hypodensities are unchanged and nonspecific but compatible with mild
chronic small vessel ischemic disease. There is mild cerebral and
cerebellar atrophy.

Vascular: Calcified atherosclerosis at the skull base.

Skull: No fracture or focal osseous lesion.

Sinuses/Orbits: Visualized paranasal sinuses and mastoid air cells
are clear. Visualized orbits are unremarkable.

Other: None.
IMPRESSION: 1. Decreased right pontomedullary cistern extra-axial hemorrhage.
2. No evidence of new intracranial abnormality.
3. Chronic small vessel ischemic disease with chronic lacunar
infarcts as above.

## 2018-10-14 MED ORDER — TRAMADOL HCL 50 MG PO TABS: 50.0000 mg | ORAL_TABLET | Freq: Four times a day (QID) | ORAL | 0 refills | Status: AC | PRN

## 2018-10-14 MED ORDER — AMLODIPINE BESYLATE 10 MG PO TABS: 10.0000 mg | ORAL_TABLET | Freq: Every day | ORAL | 0 refills | Status: DC

## 2018-10-14 MED ORDER — GENERIC EXTERNAL MEDICATION
Status: DC
Start: ? — End: 2018-10-14

## 2018-10-14 NOTE — ED Provider Notes (Signed)
[facility] Emergency Department Provider Note ____________________________________________   First MD Initiated Contact with Patient 10/14/18 1622     (approximate)  I have reviewed the triage vital signs and the nursing notes.   HISTORY  Chief Complaint Headache and Dizziness    HPI Cameron Foster. is a 78 y.o. male with PMH as noted below who presents with headache, acute onset yesterday and occurring intermittently since then.  He states that initially felt like sinus pressure but then moved to the top of his head.  He states that it occurs when he coughs or moves his head quickly but then subsides.  He denies any associated photophobia or nausea but states he did feel slightly woozy.  He states that the pain is not throbbing.  He has had no trauma.  He was just admitted at [facility] for an [facility] this week and discharged 2 days ago but states that this feels different than his initial symptoms.  Past Medical History:  Diagnosis Date  . Alcoholism ([place])   . Arthritis   . Atrial fibrillation ([place])    one episode  . Colon polyp   . Depression    Son died June 15, 2014  . Diverticulitis   . Diverticulosis 30 years  . Dysrhythmia    At Fib 1995  . GERD (gastroesophageal reflux disease)   . Hyperlipidemia   . Hypertension   . Irritable bowel syndrome   . Prostate cancer ([place]) June 15, 2010   treated with radiation therapy. Prostate  . Vasovagal syncope     Patient Active Problem List   Diagnosis Date Noted  . Low libido 08/30/2018  . Class 1 obesity without serious comorbidity with body mass index (BMI) of 31.0 to 31.9 in adult 05/14/2018  . Mixed hyperlipidemia 05/14/2018  . Degeneration of lumbar intervertebral disc 03/22/2018  . Osteoarthritis of hip 03/22/2018  . Trochanteric bursitis of right hip 03/22/2018  . Status post partial colectomy 01/03/2017  . Depression, major, single episode, mild ([place]) 01/03/2017  . Vasovagal syncope 01/27/2016  . History of  skin cancer 10/28/2015  . Osteoarthritis of right knee 10/01/2015  . Essential hypertension 08/25/2015  . Dyspnea on exertion 08/12/2015  . Erectile dysfunction following radiation therapy 08/04/2015  . Constipation 08/02/2015  . History of prostate cancer 01/20/2015  . Swelling of right lower extremity 01/20/2015    Past Surgical History:  Procedure Laterality Date  . [place] no stents  . CATARACT EXTRACTION, BILATERAL    . COLON RESECTION SIGMOID N/A 12/07/2016   Procedure: COLON RESECTION SIGMOID;  Surgeon: Clayburn Pert, MD;  Location: [facility] ORS;  Service: General;  Laterality: N/A;  . COLON SURGERY  11/2016   Colostomy  . COLONOSCOPY  15-Jun-2014  . COLONOSCOPY WITH PROPOFOL N/A 05/30/2016   Procedure: COLONOSCOPY WITH PROPOFOL;  Surgeon: Lollie Sails, MD;  Location: [facility] ENDOSCOPY;  Service: Endoscopy;  Laterality: N/A;  . COLOSTOMY Left 12/07/2016   Procedure: COLOSTOMY;  Surgeon: Clayburn Pert, MD;  Location: [facility] ORS;  Service: General;  Laterality: Left;  . COLOSTOMY REVERSAL N/A 03/21/2017   Procedure: COLOSTOMY REVERSAL;  Surgeon: Clayburn Pert, MD;  Location: [facility] ORS;  Service: General;  Laterality: N/A;  . COLOSTOMY TAKEDOWN N/A 03/21/2017   Procedure: LAPAROSCOPIC COLOSTOMY TAKEDOWN;  Surgeon: Clayburn Pert, MD;  Location: [facility] ORS;  Service: General;  Laterality: N/A;  . CYSTOSCOPY WITH STENT PLACEMENT Bilateral 03/21/2017   Procedure: CYSTOSCOPY WITH LIGHTED STENT PLACEMENT;  Surgeon: Abbie Sons,  MD;  Location: [facility] ORS;  Service: Urology;  Laterality: Bilateral;  . ESOPHAGOGASTRODUODENOSCOPY    . ESOPHAGOGASTRODUODENOSCOPY N/A 05/30/2016   Procedure: ESOPHAGOGASTRODUODENOSCOPY (EGD);  Surgeon: Lollie Sails, MD;  Location[facility] ENDOSCOPY;  Service: Endoscopy;  Laterality: N/A;  . EYE SURGERY     cataracts  . FLEXIBLE SIGMOIDOSCOPY N/A 03/21/2017   Procedure: FLEXIBLE SIGMOIDOSCOPY;  Surgeon: Clayburn Pert, MD;   Location: [facility] ORS;  Service: General;  Laterality: N/A;  . FRACTURE SURGERY Bilateral    right arm and left wrist  . INCISION AND DRAINAGE ABSCESS N/A 12/07/2016   Procedure: DRAINAGE  OF INTRA ABDOMINAL ABSCESS;  Surgeon: Clayburn Pert, MD;  Location: [facility] ORS;  Service: General;  Laterality: N/A;  . KNEE ARTHROSCOPY    . LAPAROTOMY N/A 12/07/2016   Procedure: EXPLORATORY LAPAROTOMY;  Surgeon: Clayburn Pert, MD;  Location: [facility] ORS;  Service: General;  Laterality: N/A;  . PROSTATE SURGERY     Microwave therapy  . TONSILLECTOMY    . TOTAL KNEE ARTHROPLASTY Right 07/27/2016   Procedure: TOTAL KNEE ARTHROPLASTY;  Surgeon: [name], MD;  Location: [facility] ORS;  Service: Orthopedics;  Laterality: Right;  Dr. Erlene Quan had to place Urinary catheter due to prostate cancer history.  Using flexible scope.    Prior to Admission medications   Medication Sig Start Date End Date Taking? Authorizing Provider  amLODipine (NORVASC) 10 MG tablet Take 1 tablet (10 mg total) by mouth daily for 30 days. 10/14/18 11/13/18  Arta Silence, MD  docusate sodium (STOOL SOFTENER) 100 MG capsule Take 100 mg by mouth 2 (two) times daily.    [provider]  fluticasone (FLONASE) 50 MCG/ACT nasal spray Place 1 spray into both nostrils daily as needed for allergies or rhinitis.    [provider]  furosemide (LASIX) 20 MG tablet Take 1 tablet (20 mg total) by mouth daily. 05/02/18 07/31/18  End, Harrell Gave, MD  hydrocortisone 2.5 % cream APPLY CREAM TO AFFECTED AREA TWICE DAILY AS NEEDED FOR RASH 05/28/18   Bacigalupo, Dionne Bucy, MD  lisinopril (PRINIVIL,ZESTRIL) 10 MG tablet TAKE 1 TABLET BY MOUTH EVERY DAY 07/06/18   Bacigalupo, Dionne Bucy, MD  liver oil-zinc oxide (DESITIN) 40 % ointment Apply 1 application topically daily as needed for irritation.    [provider]  meloxicam (MOBIC) 15 MG tablet Take 15 mg by mouth daily.  04/19/18   [provider]  nebivolol (BYSTOLIC) 10 MG  tablet Take 1 tablet (10 mg total) by mouth daily. 05/02/18   End, Harrell Gave, MD  omeprazole (PRILOSEC) 20 MG capsule Take 20 mg by mouth daily as needed (heartburn).     [provider]  sertraline (ZOLOFT) 100 MG tablet Take 1 tablet (100 mg total) by mouth daily. 05/30/18   Bacigalupo, Dionne Bucy, MD  traMADol (ULTRAM) 50 MG tablet Take 1 tablet (50 mg total) by mouth every 6 (six) hours as needed for up to 5 days. 10/14/18 10/19/18  Arta Silence, MD    Allergies Augmentin [amoxicillin-pot clavulanate], Formaldehyde, and Tape  Family History  Problem Relation Age of Onset  . Lung cancer Father        smoker  . Other Mother   . Sudden death Son        due to "Blood clots"  . Bipolar disorder Son   . Kidney disease Daughter        congenital one small kidney  . Prostate cancer Neg Hx   . Bladder Cancer Neg Hx  Social History Social History   Tobacco Use  . Smoking status: Former Smoker    Packs/day: 1.00    Years: 27.00    Pack years: 27.00    Types: Cigarettes    Quit date: 04/18/1988    Years since quitting: 30.5  . Smokeless tobacco: Former Systems developer    Types: Chew  Substance Use Topics  . Alcohol use: Yes    Alcohol/week: 3.0 standard drinks    Types: 3 Standard drinks or equivalent per week    Comment: Occasional- Former Heavy ETOH Use  . Drug use: No    Review of Systems  Constitutional: No fever. Eyes: No visual changes. ENT: No neck pain. Cardiovascular: Denies chest pain. Respiratory: Denies shortness of breath. Gastrointestinal: No nausea or vomiting. Genitourinary: Negative for dysuria.  Musculoskeletal: Negative for back pain. Skin: Negative for rash. Neurological: Positive for headache, negative for focal weakness or numbness.   ____________________________________________   PHYSICAL EXAM:  VITAL SIGNS: ED Triage Vitals  Enc Vitals Group     BP 10/14/18 1613 140/78     Pulse Rate 10/14/18 1613 77     Resp 10/14/18 1613 16      Temp 10/14/18 1613 99.2 F (37.3 C)     Temp Source 10/14/18 1613 Oral     SpO2 10/14/18 1613 95 %     Weight 10/14/18 1614 210 lb (95.3 kg)     Height 10/14/18 1614 5' 9"  (1.753 m)     Head Circumference --      Peak Flow --      Pain Score 10/14/18 1614 4     Pain Loc --      Pain Edu? --      Excl. in [place]? --     Constitutional: Alert and oriented. Well appearing and in no acute distress. Eyes: Conjunctivae are normal.  EOMI.  PERRLA.  No photophobia. Head: Atraumatic. Nose: No congestion/rhinnorhea. Mouth/Throat: Mucous membranes are moist.   Neck: Normal range of motion.  No meningeal signs. Cardiovascular: Good peripheral circulation. Respiratory: Normal respiratory effort.   Gastrointestinal: No distention.  Musculoskeletal: Extremities warm and well perfused.  Neurologic:  Normal speech and language.  Motor and sensory intact in all extremities.  Normal coordination with no ataxia.  No pronator drift.  No gross focal neurologic deficits are appreciated.  Skin:  Skin is warm and dry. No rash noted. Psychiatric: Mood and affect are normal. Speech and behavior are normal.  ____________________________________________   LABS (all labs ordered are listed, but only abnormal results are displayed)  Labs Reviewed - No data to display ____________________________________________  EKG  ED ECG REPORT I, Arta Silence, the attending physician, personally viewed and interpreted this ECG.  Date: 10/14/2018 EKG Time: 1623 Rate: 79 Rhythm: normal sinus rhythm QRS Axis: normal Intervals: LAFB ST/T Wave abnormalities: RVH with repolarization abnormality Narrative Interpretation: no evidence of acute ischemia  ____________________________________________  RADIOLOGY  CT head: Decreased hemorrhage with no acute abnormalities  ____________________________________________   PROCEDURES  Procedure(s) performed: No  Procedures  Critical Care performed: No  ____________________________________________   INITIAL IMPRESSION / ASSESSMENT AND PLAN / ED COURSE  Pertinent labs & imaging results that were available during my care of the patient were reviewed by me and considered in my medical decision making (see chart for details).  78 year old male with PMH as noted above and status post recent admission at [facility] for a subarachnoid hemorrhage presents with a new headache since yesterday which is intermittent and associated with movement.  He has no photo or phonophobia and no meningeal signs.  He has had no nausea or vomiting.  He states that the symptoms feel different than what he had when he presented last week.  I reviewed the past medical records in Epic and care everywhere.  The patient was initially seen here in the ED and diagnosed with a subarachnoid hemorrhage although no aneurysm was identified on CT angiogram.  The patient was subsequently transferred to [facility] for further evaluation and management and discharged 3 days ago.  There is no specific source for the subarachnoid bleeding identified.  On exam he is well-appearing and his vital signs are normal.  Neurologic exam is nonfocal.  Overall I suspect most likely pain related to the recent subarachnoid as he could have some meningeal irritation or other residual symptoms.  However, differential also includes new acute hemorrhage.  We will obtain a CT head and reassess.  If it is negative I will consult neurosurgery or neurology from [place].  ----------------------------------------- 7:41 PM on 10/14/2018 -----------------------------------------  CT shows improved hemorrhage and no acute bleed.  I discussed the case with Dr. Manuella Ghazi from neurology at [facility] who was on the team taking care of the patient during his admission.  He agrees that the patient's headache is consistent with redistribution of the blood after his recent hemorrhage and does not recommend any further imaging.  He recommends pain  control and routine follow-up.  The patient is stable for discharge home.  His blood pressure has been normal throughout his ED stay.  He is extremely concerned that his blood pressure continues to go up after he takes his amlodipine 5 mg, and today he actually took a second dose of 5 mg a few hours later.  I had an extensive discussion with him about this.  The patient initially was adamant that he wanted an additional medication, however given that today he has demonstrated good blood pressure control with the 10 mg that he took I think a reasonable next step would be to switch up to 10 mg instead of the 5 mg.  The patient agrees with this plan and I specifically instructed him not to take multiple doses per day.  Return precautions given, and the patient expresses understanding.  He agrees to follow-up with his regular doctor.  ____________________________________________   FINAL CLINICAL IMPRESSION(S) / ED DIAGNOSES  Final diagnoses:  Acute nonintractable headache, unspecified headache type      NEW MEDICATIONS STARTED DURING THIS VISIT:  New Prescriptions   AMLODIPINE (NORVASC) 10 MG TABLET    Take 1 tablet (10 mg total) by mouth daily for 30 days.   TRAMADOL (ULTRAM) 50 MG TABLET    Take 1 tablet (50 mg total) by mouth every 6 (six) hours as needed for up to 5 days.     Note:  This document was prepared using Dragon voice recognition software and may include unintentional dictation errors.    Arta Silence, MD 10/14/18 1944

## 2018-10-14 NOTE — ED Triage Notes (Signed)
Pt states he was admitted to [facility] last Wednesday with a CVA/bleed, states he was discharged on Friday. Pt c/o having a HA with feeling lightheaded since last night. Pt is a/ox4 on arrival.

## 2018-10-14 NOTE — Discharge Instructions (Addendum)
Stop taking the 5 mg amlodipine and start taking 10 mg.  Do not take the amlodipine multiple times per day.  Continue to take your other blood pressure medication as well.  You can take the tramadol as needed for pain that is not relieved by over-the-counter medicine.  Follow-up with your doctor within the next 1 to 2 weeks and keep a log of your blood pressure.  Return to the ER for new, worsening, or persistent severe headache, dizziness, or any other new or worsening symptoms that concern you.

## 2018-10-14 NOTE — ED Notes (Signed)
Pt arrives to ED for HA that began yesterday around lunch per pt. He stated he began having sinus pressure and it moved to top of head, pt states "in my brain," by evening. Pt states he was seen here Wednesday and told he had acute hemorrhage in pons and medulla and was transferred to [facility]. Was DC Friday night. Speech clear. Moving extremities on own. States he feels "whoozy" but denies room spinning. Denies vision problems at this time. States some nausea. A&O, was a little wobbly on feet while standing to get undressed.

## 2018-10-14 NOTE — ED Notes (Signed)
Patient transported to CT 

## 2018-10-15 NOTE — Progress Notes (Signed)
Patient: Cameron Foster. Male    DOB: 06-29-1940   78 y.o.   MRN: [id number] Visit Date: 10/18/2018  Today's Provider: Lavon Paganini, MD   Chief Complaint  Patient presents with   ER Follow Up   Subjective:    Virtual Visit via Telephone Note  I connected with [name] on 10/18/18 at 10:00 AM EDT by telephone and verified that I am speaking with the correct person using two identifiers.   Patient location: home Provider location: [place] involved in the visit: patient, provider   I discussed the limitations, risks, security and privacy concerns of performing an evaluation and management service by telephone and the availability of in person appointments. I also discussed with the patient that there may be a patient responsible charge related to this service. The patient expressed understanding and agreed to proceed.  HPI  Follow up ER visit  Patient was seen in ER for headache & dizziness on 10/14/2018. He was treated for acute nonintractable headache. Patient states he had a stroke. Treatment for this included started Tramadol & increased Amlodipine from 5 mg to 10 mg . He reports good compliance with treatment. He reports this condition is slightly Improved, but still experiencing headaches.   Prior to that, he was hospitalized in the [facility] neuro ICU from 6/25 to 6/26 for subarachnoid hemorrhage and hypertension.  On 10/10/2018, he developed the worst headache of his life with double vision and went to [facility] for evaluation.  CT head and CTA brain showed acute extra-axial hemorrhage in posterior fossa along right aspect of pons and medulla and it was negative for intracranial aneurysm, vascular malformation, or LVO.  He was transferred to neuro ICU for further neuro management.  Repeat CT head on 6/25 showed stable subarachnoid hemorrhage in the right posterior fossa  Repeat CT head on 6/28 at [facility] ED showed decreased hemorrhage  volume with no acute abnormalities.  His amlodipine was increased to 10 mg daily. BP is now well controlled at home.  Feels as though he has another sinusitis.  He was well treated previously with Doxycycline.  Denies fever, sore throat, cough, rhinorrhea.  Does have occasional post-nasal drip.  Symptoms present since prior to [facility] >1 wk.  Has not seen ENT.  CT maxillofacial 6/24 showed clear sinuses.  COVID test negative at [facility] on 6/25. ------------------------------------------------------------------------------------   Allergies  Allergen Reactions   Augmentin [Amoxicillin-Pot Clavulanate] Other (See Comments)    Abdominal upset Has patient had a PCN reaction causing immediate rash, facial/tongue/throat swelling, SOB or lightheadedness with hypotension: Unknown Has patient had a PCN reaction causing severe rash involving mucus membranes or skin necrosis: Unknown Has patient had a PCN reaction that required hospitalization: Unknown Has patient had a PCN reaction occurring within the last 10 years: Unknown If all of the above answers are "NO", then may proceed with Cephalosporin use.    Formaldehyde Rash   Tape Rash    Pt states he is not allergic to tape.     Current Outpatient Medications:    amLODipine (NORVASC) 10 MG tablet, Take 1 tablet (10 mg total) by mouth daily for 30 days., Disp: 30 tablet, Rfl: 0   docusate sodium (STOOL SOFTENER) 100 MG capsule, Take 100 mg by mouth 2 (two) times daily., Disp: , Rfl:    fluticasone (FLONASE) 50 MCG/ACT nasal spray, Place 1 spray into both nostrils daily as needed for allergies or rhinitis., Disp: , Rfl:  hydrocortisone 2.5 % cream, APPLY CREAM TO AFFECTED AREA TWICE DAILY AS NEEDED FOR RASH, Disp: 28 g, Rfl: 11   lisinopril (PRINIVIL,ZESTRIL) 10 MG tablet, TAKE 1 TABLET BY MOUTH EVERY DAY, Disp: 90 tablet, Rfl: 1   liver oil-zinc oxide (DESITIN) 40 % ointment, Apply 1 application topically daily as needed for irritation., Disp:  , Rfl:    meloxicam (MOBIC) 15 MG tablet, Take 15 mg by mouth daily. , Disp: , Rfl:    nebivolol (BYSTOLIC) 10 MG tablet, Take 1 tablet (10 mg total) by mouth daily., Disp: 90 tablet, Rfl: 2   omeprazole (PRILOSEC) 20 MG capsule, Take 20 mg by mouth daily as needed (heartburn). , Disp: , Rfl:    sertraline (ZOLOFT) 100 MG tablet, Take 1 tablet (100 mg total) by mouth daily., Disp: 90 tablet, Rfl: 3   traMADol (ULTRAM) 50 MG tablet, Take 1 tablet (50 mg total) by mouth every 6 (six) hours as needed for up to 5 days., Disp: 12 tablet, Rfl: 0   doxycycline (VIBRA-TABS) 100 MG tablet, Take 1 tablet (100 mg total) by mouth 2 (two) times daily., Disp: 20 tablet, Rfl: 0   furosemide (LASIX) 20 MG tablet, Take 1 tablet (20 mg total) by mouth daily., Disp: 90 tablet, Rfl: 3  Review of Systems  Constitutional: Negative.   Respiratory: Negative.   Cardiovascular: Negative.   Musculoskeletal: Negative.   Neurological: Positive for headaches.    Social History   Tobacco Use   Smoking status: Former Smoker    Packs/day: 1.00    Years: 27.00    Pack years: 27.00    Types: Cigarettes    Quit date: 04/18/1988    Years since quitting: 30.5   Smokeless tobacco: Former Systems developer    Types: Chew  Substance Use Topics   Alcohol use: Yes    Alcohol/week: 3.0 standard drinks    Types: 3 Standard drinks or equivalent per week    Comment: Occasional- Former Heavy ETOH Use      Objective:   There were no vitals taken for this visit. There were no vitals filed for this visit.   Physical Exam   No results found for any visits on 10/16/18.     Assessment & Plan    I discussed the assessment and treatment plan with the patient. The patient was provided an opportunity to ask questions and all were answered. The patient agreed with the plan and demonstrated an understanding of the instructions.   The patient was advised to call back or seek an in-person evaluation if the symptoms worsen or if the  condition fails to improve as anticipated.  Problem List Items Addressed This Visit      Cardiovascular and Mediastinum   Essential hypertension    Reports is currently well controlled at home Was uncontrolled in the setting of recent [place] Amlodipine was increased to 10 mg daily, which we will continue Discussed with patient that as he improves from his [place], he may need to decrease the dose again We will continue to monitor Reviewed recent metabolic panel        Respiratory   Recurrent sinusitis    Patient continues to have recurrent episodes of sinus congestion and pain Discussed with him that on his recent CT scan his sinuses were clear Advised him to take Flonase regularly As he has had similar symptoms in the past and improvement with doxycycline, I will prescribe a course of this in case there is any acute sinusitis currently  Advised him to see an ENT regarding this recurrent symptom and referral placed today      Relevant Medications   doxycycline (VIBRA-TABS) 100 MG tablet   Other Relevant Orders   Ambulatory referral to ENT     Nervous and Auditory   SAH (subarachnoid hemorrhage) ([place]) - Primary    Improving Headaches are improving and he is otherwise neuro intact Was treated by [facility] neurology and has a follow-up in about 6 weeks On recent CT scan, it showed that volume of hemorrhage is already decreasing Discussed strict return precautions          Return in about 4 weeks (around 11/13/2018) for chronic disease f/u as scheduled.   The entirety of the information documented in the History of Present Illness, Review of Systems and Physical Exam were personally obtained by me. Portions of this information were initially documented by Tiburcio Pea, CMA and reviewed by me for thoroughness and accuracy.    Lilo Wallington, Dionne Bucy, MD MPH [facility]

## 2018-10-16 ENCOUNTER — Ambulatory Visit (INDEPENDENT_AMBULATORY_CARE_PROVIDER_SITE_OTHER): Payer: Medicare Other | Admitting: Family Medicine

## 2018-10-16 ENCOUNTER — Encounter: Payer: Self-pay | Admitting: Family Medicine

## 2018-10-16 DIAGNOSIS — I609 Nontraumatic subarachnoid hemorrhage, unspecified: Secondary | ICD-10-CM

## 2018-10-16 DIAGNOSIS — J329 Chronic sinusitis, unspecified: Secondary | ICD-10-CM | POA: Diagnosis not present

## 2018-10-16 DIAGNOSIS — I10 Essential (primary) hypertension: Secondary | ICD-10-CM | POA: Diagnosis not present

## 2018-10-16 DIAGNOSIS — J0191 Acute recurrent sinusitis, unspecified: Secondary | ICD-10-CM | POA: Insufficient documentation

## 2018-10-16 DIAGNOSIS — Z8679 Personal history of other diseases of the circulatory system: Secondary | ICD-10-CM | POA: Insufficient documentation

## 2018-10-16 MED ORDER — DOXYCYCLINE HYCLATE 100 MG PO TABS: 100.0000 mg | ORAL_TABLET | Freq: Two times a day (BID) | ORAL | 0 refills | Status: DC

## 2018-10-17 ENCOUNTER — Other Ambulatory Visit: Payer: Self-pay | Admitting: Family Medicine

## 2018-10-17 ENCOUNTER — Telehealth: Payer: Self-pay | Admitting: Pulmonary Disease

## 2018-10-18 NOTE — Telephone Encounter (Signed)
Appt. Rescheduled. Nothing further needed

## 2018-10-18 NOTE — Assessment & Plan Note (Signed)
Reports is currently well controlled at home Was uncontrolled in the setting of recent [place] Amlodipine was increased to 10 mg daily, which we will continue Discussed with patient that as he improves from his [facility], he may need to decrease the dose again We will continue to monitor Reviewed recent metabolic panel

## 2018-10-18 NOTE — Assessment & Plan Note (Signed)
Patient continues to have recurrent episodes of sinus congestion and pain Discussed with him that on his recent CT scan his sinuses were clear Advised him to take Flonase regularly As he has had similar symptoms in the past and improvement with doxycycline, I will prescribe a course of this in case there is any acute sinusitis currently Advised him to see an ENT regarding this recurrent symptom and referral placed today

## 2018-10-18 NOTE — Assessment & Plan Note (Signed)
Improving Headaches are improving and he is otherwise neuro intact Was treated by [facility] neurology and has a follow-up in about 6 weeks On recent CT scan, it showed that volume of hemorrhage is already decreasing Discussed strict return precautions

## 2018-10-20 ENCOUNTER — Inpatient Hospital Stay
Admission: EM | Admit: 2018-10-20 | Discharge: 2018-10-23 | DRG: 123 | Disposition: A | Discharge status: Home / Self Care | Payer: Medicare Other | Attending: Internal Medicine | Admitting: Internal Medicine

## 2018-10-20 ENCOUNTER — Other Ambulatory Visit: Payer: Self-pay

## 2018-10-20 ENCOUNTER — Encounter: Payer: Self-pay | Admitting: Emergency Medicine

## 2018-10-20 ENCOUNTER — Emergency Department: Payer: Medicare Other

## 2018-10-20 DIAGNOSIS — I10 Essential (primary) hypertension: Secondary | ICD-10-CM | POA: Diagnosis present

## 2018-10-20 DIAGNOSIS — Z888 Allergy status to other drugs, medicaments and biological substances status: Secondary | ICD-10-CM

## 2018-10-20 DIAGNOSIS — F1020 Alcohol dependence, uncomplicated: Secondary | ICD-10-CM | POA: Diagnosis present

## 2018-10-20 DIAGNOSIS — K219 Gastro-esophageal reflux disease without esophagitis: Secondary | ICD-10-CM | POA: Diagnosis present

## 2018-10-20 DIAGNOSIS — H532 Diplopia: Secondary | ICD-10-CM | POA: Diagnosis not present

## 2018-10-20 DIAGNOSIS — I4891 Unspecified atrial fibrillation: Secondary | ICD-10-CM | POA: Diagnosis present

## 2018-10-20 DIAGNOSIS — H01006 Unspecified blepharitis left eye, unspecified eyelid: Secondary | ICD-10-CM | POA: Diagnosis present

## 2018-10-20 DIAGNOSIS — H5500 Unspecified nystagmus: Secondary | ICD-10-CM | POA: Diagnosis present

## 2018-10-20 DIAGNOSIS — K589 Irritable bowel syndrome without diarrhea: Secondary | ICD-10-CM | POA: Diagnosis present

## 2018-10-20 DIAGNOSIS — Z9109 Other allergy status, other than to drugs and biological substances: Secondary | ICD-10-CM | POA: Diagnosis not present

## 2018-10-20 DIAGNOSIS — Z880 Allergy status to penicillin: Secondary | ICD-10-CM

## 2018-10-20 DIAGNOSIS — Z87891 Personal history of nicotine dependence: Secondary | ICD-10-CM | POA: Diagnosis not present

## 2018-10-20 DIAGNOSIS — Z8679 Personal history of other diseases of the circulatory system: Secondary | ICD-10-CM | POA: Diagnosis not present

## 2018-10-20 DIAGNOSIS — Z1159 Encounter for screening for other viral diseases: Secondary | ICD-10-CM

## 2018-10-20 DIAGNOSIS — Z86010 Personal history of colonic polyps: Secondary | ICD-10-CM

## 2018-10-20 DIAGNOSIS — I672 Cerebral atherosclerosis: Secondary | ICD-10-CM | POA: Diagnosis present

## 2018-10-20 DIAGNOSIS — M1990 Unspecified osteoarthritis, unspecified site: Secondary | ICD-10-CM | POA: Diagnosis present

## 2018-10-20 DIAGNOSIS — R29818 Other symptoms and signs involving the nervous system: Secondary | ICD-10-CM | POA: Diagnosis not present

## 2018-10-20 DIAGNOSIS — Z03818 Encounter for observation for suspected exposure to other biological agents ruled out: Secondary | ICD-10-CM | POA: Diagnosis not present

## 2018-10-20 DIAGNOSIS — H01003 Unspecified blepharitis right eye, unspecified eyelid: Secondary | ICD-10-CM | POA: Diagnosis present

## 2018-10-20 DIAGNOSIS — Z791 Long term (current) use of non-steroidal anti-inflammatories (NSAID): Secondary | ICD-10-CM | POA: Diagnosis not present

## 2018-10-20 DIAGNOSIS — Z923 Personal history of irradiation: Secondary | ICD-10-CM

## 2018-10-20 DIAGNOSIS — H4921 Sixth [abducent] nerve palsy, right eye: Secondary | ICD-10-CM | POA: Diagnosis not present

## 2018-10-20 DIAGNOSIS — Z87820 Personal history of traumatic brain injury: Secondary | ICD-10-CM | POA: Diagnosis not present

## 2018-10-20 DIAGNOSIS — F329 Major depressive disorder, single episode, unspecified: Secondary | ICD-10-CM | POA: Diagnosis present

## 2018-10-20 DIAGNOSIS — Z79899 Other long term (current) drug therapy: Secondary | ICD-10-CM

## 2018-10-20 DIAGNOSIS — E876 Hypokalemia: Secondary | ICD-10-CM | POA: Diagnosis not present

## 2018-10-20 DIAGNOSIS — I6521 Occlusion and stenosis of right carotid artery: Secondary | ICD-10-CM | POA: Diagnosis not present

## 2018-10-20 DIAGNOSIS — E785 Hyperlipidemia, unspecified: Secondary | ICD-10-CM | POA: Diagnosis present

## 2018-10-20 DIAGNOSIS — Z8546 Personal history of malignant neoplasm of prostate: Secondary | ICD-10-CM | POA: Diagnosis not present

## 2018-10-20 DIAGNOSIS — F1011 Alcohol abuse, in remission: Secondary | ICD-10-CM | POA: Diagnosis not present

## 2018-10-20 DIAGNOSIS — G459 Transient cerebral ischemic attack, unspecified: Secondary | ICD-10-CM | POA: Diagnosis present

## 2018-10-20 DIAGNOSIS — H4920 Sixth [abducent] nerve palsy, unspecified eye: Secondary | ICD-10-CM | POA: Diagnosis not present

## 2018-10-20 LAB — PROTIME-INR
INR: 1 (ref 0.8–1.2)
Prothrombin Time: 13.1 seconds (ref 11.4–15.2)

## 2018-10-20 LAB — CBC WITH DIFFERENTIAL/PLATELET
Abs Immature Granulocytes: 0.03 10*3/uL (ref 0.00–0.07)
Basophils Absolute: 0.1 10*3/uL (ref 0.0–0.1)
Basophils Relative: 1 %
Eosinophils Absolute: 0.2 10*3/uL (ref 0.0–0.5)
Eosinophils Relative: 3 %
HCT: 35.1 % — ABNORMAL LOW (ref 39.0–52.0)
Hemoglobin: 11.4 g/dL — ABNORMAL LOW (ref 13.0–17.0)
Immature Granulocytes: 0 %
Lymphocytes Relative: 17 %
Lymphs Abs: 1.2 10*3/uL (ref 0.7–4.0)
MCH: 28.3 pg (ref 26.0–34.0)
MCHC: 32.5 g/dL (ref 30.0–36.0)
MCV: 87.1 fL (ref 80.0–100.0)
Monocytes Absolute: 0.6 10*3/uL (ref 0.1–1.0)
Monocytes Relative: 8 %
Neutro Abs: 4.9 10*3/uL (ref 1.7–7.7)
Neutrophils Relative %: 71 %
Platelets: 273 10*3/uL (ref 150–400)
RBC: 4.03 MIL/uL — ABNORMAL LOW (ref 4.22–5.81)
RDW: 13.9 % (ref 11.5–15.5)
WBC: 7 10*3/uL (ref 4.0–10.5)
nRBC: 0 % (ref 0.0–0.2)

## 2018-10-20 LAB — BASIC METABOLIC PANEL
Anion gap: 10 (ref 5–15)
BUN: 12 mg/dL (ref 8–23)
CO2: 23 mmol/L (ref 22–32)
Calcium: 8.8 mg/dL — ABNORMAL LOW (ref 8.9–10.3)
Chloride: 103 mmol/L (ref 98–111)
Creatinine, Ser: 1.25 mg/dL — ABNORMAL HIGH (ref 0.61–1.24)
GFR calc Af Amer: >60 mL/min (ref >60)
GFR calc non Af Amer: 55 mL/min — ABNORMAL LOW (ref >60)
Glucose, Bld: 106 mg/dL — ABNORMAL HIGH (ref 70–99)
Potassium: 3.2 mmol/L — ABNORMAL LOW (ref 3.5–5.1)
Sodium: 136 mmol/L (ref 135–145)

## 2018-10-20 LAB — ETHANOL: Alcohol, Ethyl (B): <10 mg/dL (ref <10)

## 2018-10-20 IMAGING — CT CT HEAD WITHOUT CONTRAST
3 series · 16 of 47 positions shown, 19 images · non-contrast
Comparison: Head CT [DATE] trauma [DATE]

CLINICAL DATA: Neuro deficit(s), subacute. Double vision. Patient
reports recent stroke.

EXAM:
CT HEAD WITHOUT CONTRAST
TECHNIQUE: Contiguous axial images were obtained from the base of the skull
through the vertex without intravenous contrast.

[Series 2: head wo · axial · 0.40mm/px · z∈[+394,+519]mm · 10 of 31 slices shown, 13 images]
[im 3/31  brain]
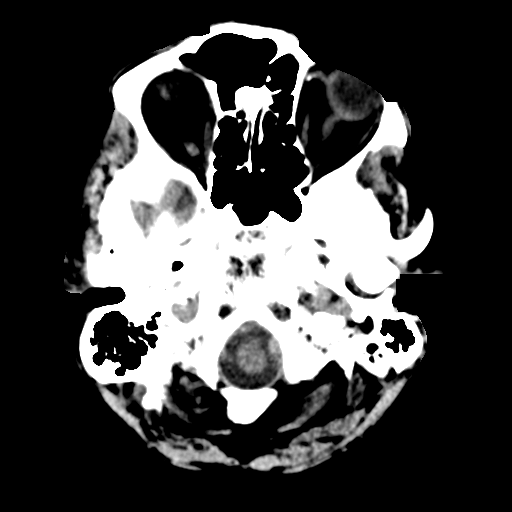
[im 3/31  bone]
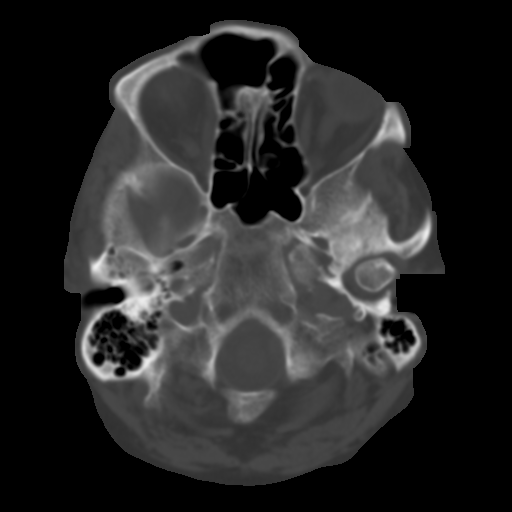
[im 6/31  brain]
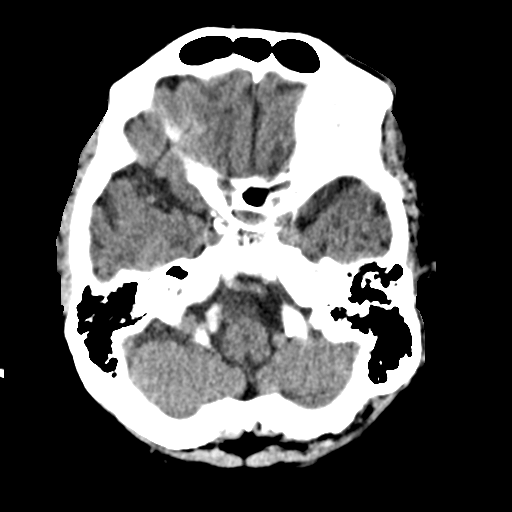
[im 9/31  brain]
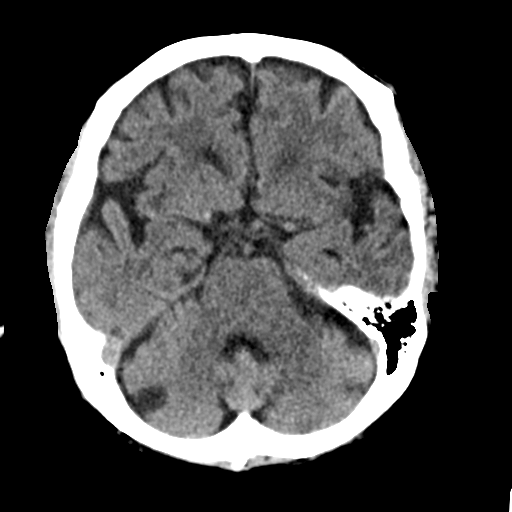
[im 11/31  brain]
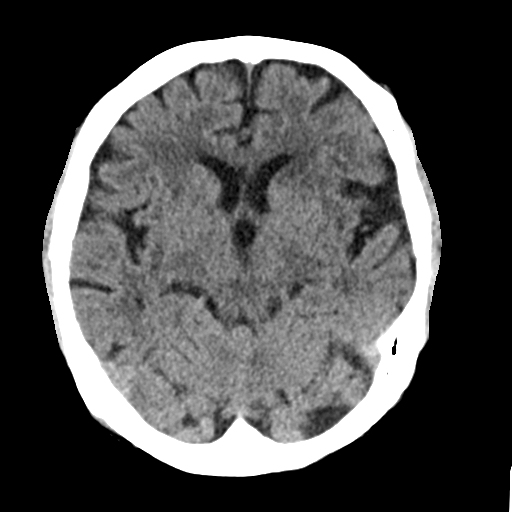
[im 14/31  brain]
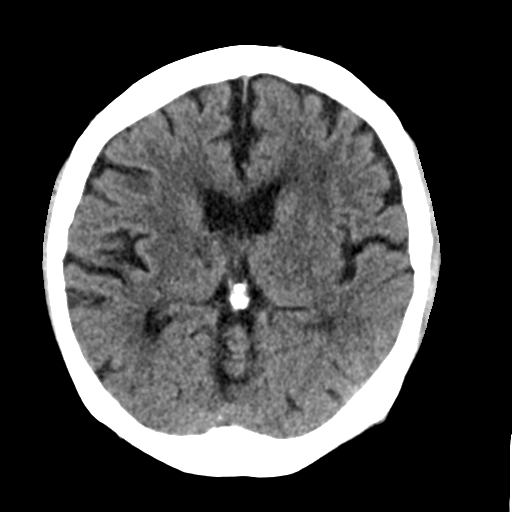
[im 14/31  bone]
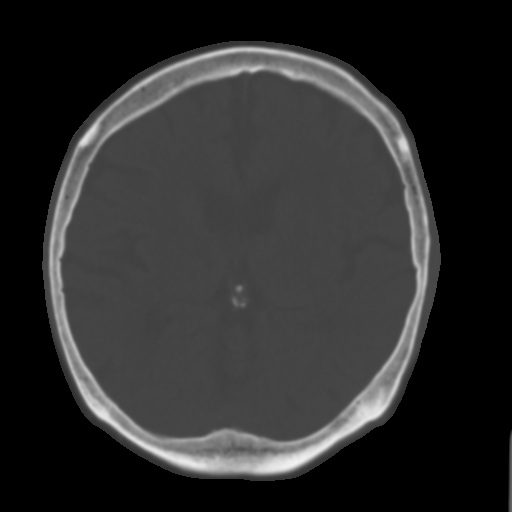
[im 17/31  brain]
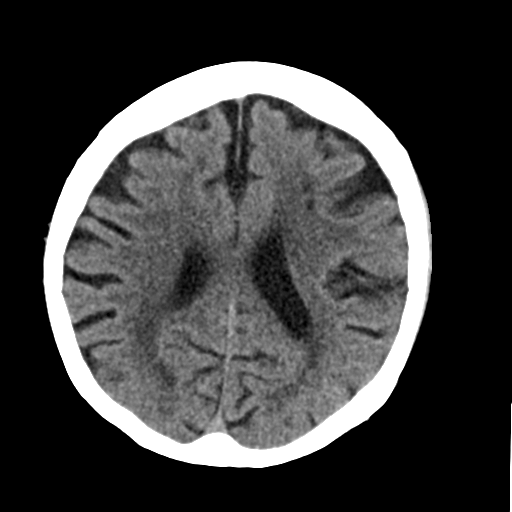
[im 20/31  brain]
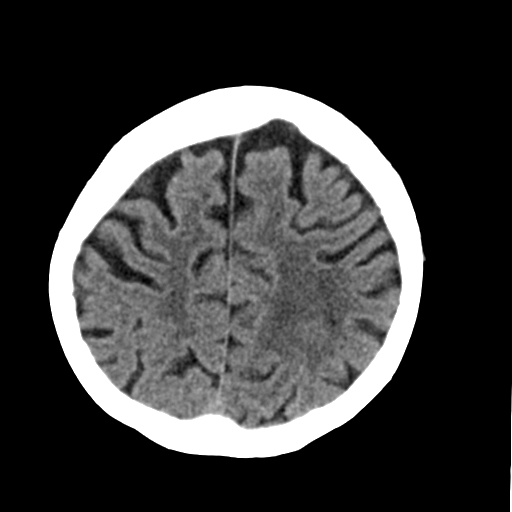
[im 23/31  brain]
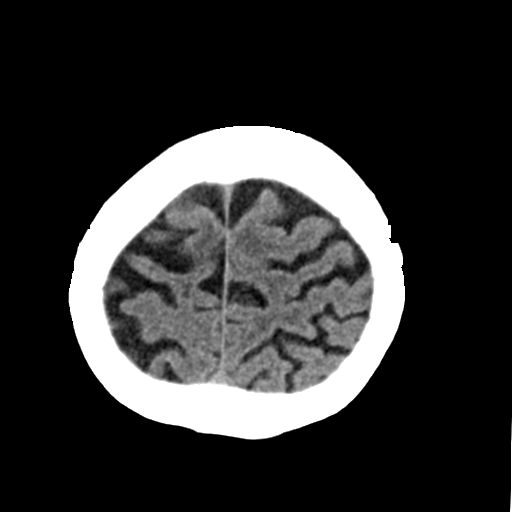
[im 25/31  brain]
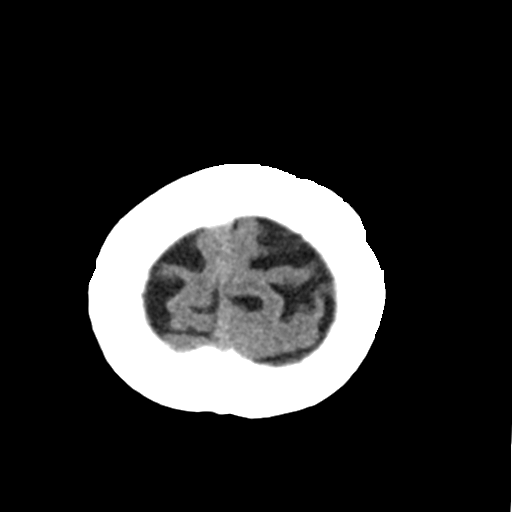
[im 25/31  bone]
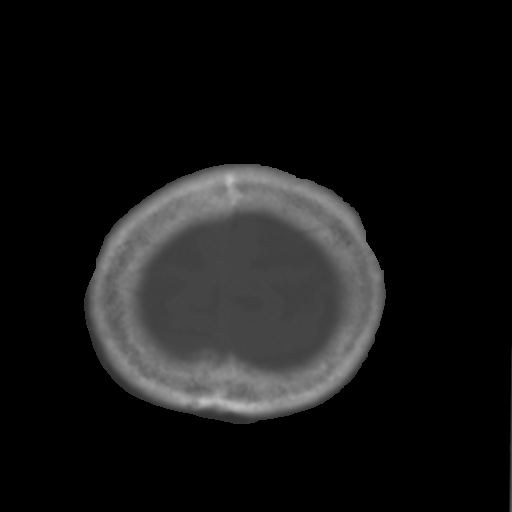
[im 28/31  brain]
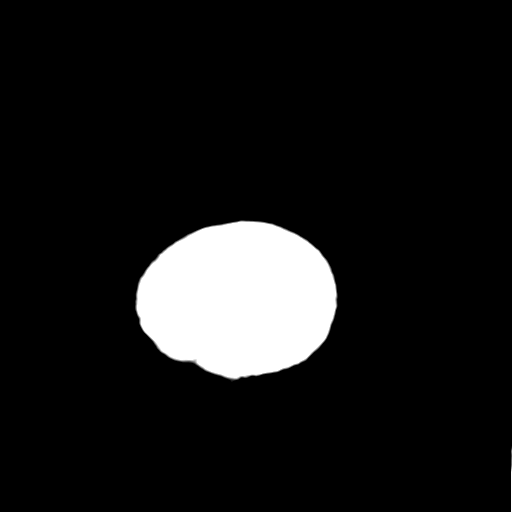

[Series 4: coronal soft tissue · coronal · 0.30mm/px · 3 of 60 slices shown]
[im 20/60  brain]
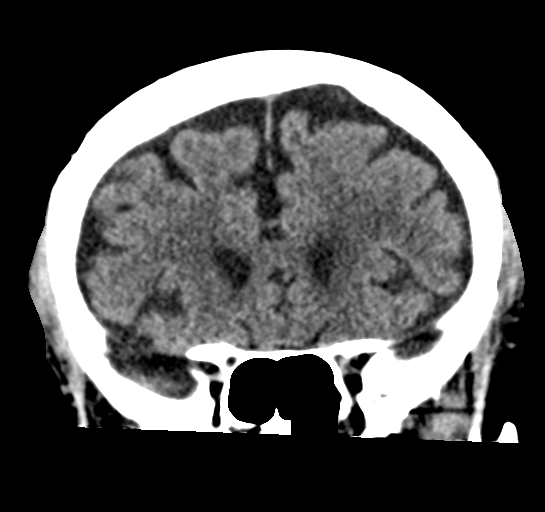
[im 27/60  brain]
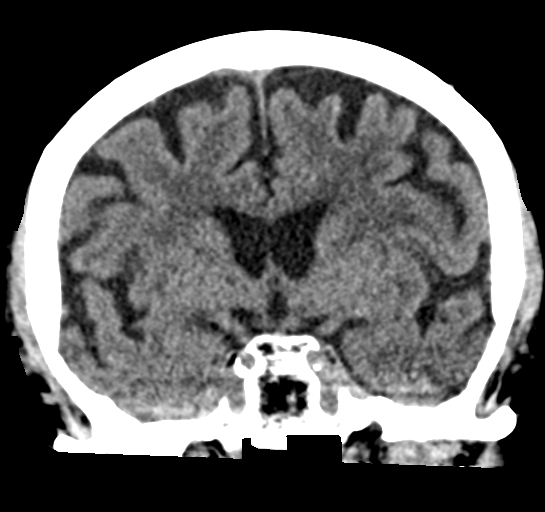
[im 33/60  brain]
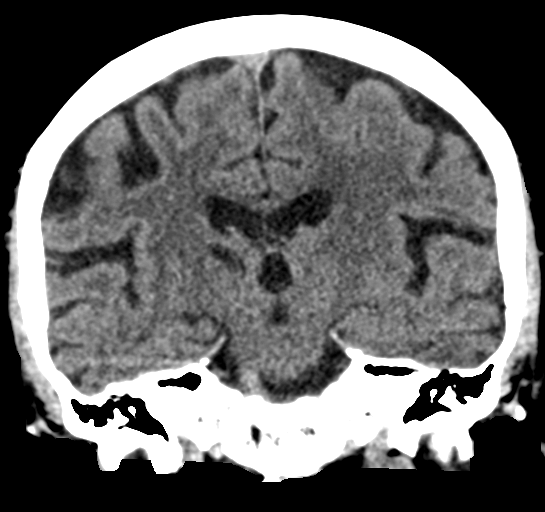

[Series 5: sagittal soft tissue · sagittal · 0.30mm/px · 3 of 53 slices shown]
[im 18/53  brain]
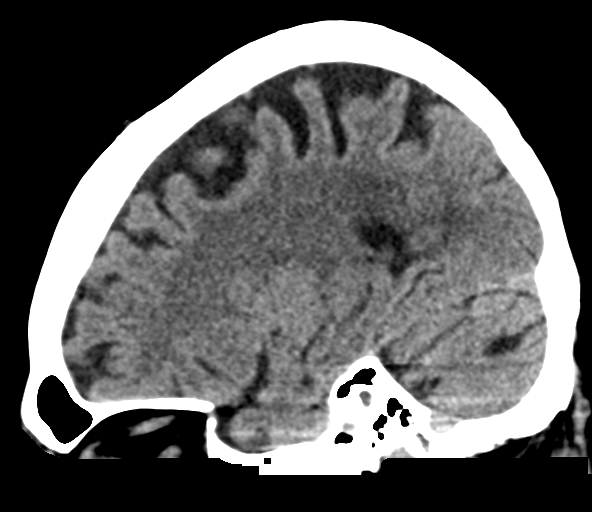
[im 27/53  brain]
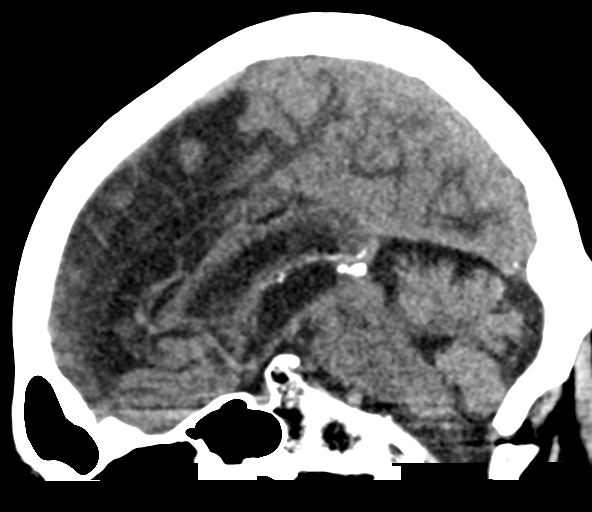
[im 35/53  brain]
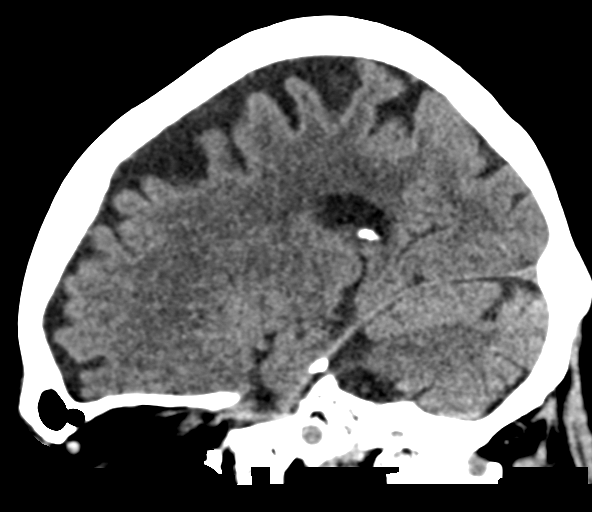

[16 of 47 positions shown; findings below may reference images not displayed]

FINDINGS: Brain: Previous extra-axial hemorrhage along the pons and medulla
has resolved without definite residual. No new or acute hemorrhage.
No evidence of acute ischemia. Unchanged remote bilateral cerebellar
infarcts in the cone or infarct in the right thalamus. Unchanged
degree of atrophy and chronic small vessel ischemia. No midline
shift or mass effect. No hydrocephalus.

Vascular: Atherosclerotic vascular calcifications without hyperdense
vessel.

Skull: No fracture or focal lesion.

Sinuses/Orbits: Paranasal sinuses and mastoid air cells are clear.
The visualized orbits are unremarkable.

Other: None.
IMPRESSION: 1. No acute intracranial abnormality.
2. Resolved right pontinomedullary cistern extra-axial hemorrhage
from prior exam.
3. Unchanged atrophy, chronic small vessel ischemia, and remote
lacunar infarcts.

## 2018-10-20 MED ORDER — ONDANSETRON HCL 4 MG/2ML IJ SOLN: 4.0000 mg | Freq: Four times a day (QID) | INTRAMUSCULAR | Status: DC | PRN

## 2018-10-20 MED ORDER — POLYETHYLENE GLYCOL 3350 17 G PO PACK: 17.0000 g | PACK | Freq: Every day | ORAL | Status: DC | PRN

## 2018-10-20 MED ORDER — VITAMIN B-1 100 MG PO TABS
100.0000 mg | ORAL_TABLET | Freq: Every day | ORAL | Status: DC
  Administered 2018-10-21 – 2018-10-23 (×3): 100 mg via ORAL
  Filled 2018-10-20 (×3): qty 1

## 2018-10-20 MED ORDER — FOLIC ACID 1 MG PO TABS
1.0000 mg | ORAL_TABLET | Freq: Every day | ORAL | Status: DC
  Administered 2018-10-21 – 2018-10-23 (×3): 1 mg via ORAL
  Filled 2018-10-20 (×3): qty 1

## 2018-10-20 MED ORDER — ADULT MULTIVITAMIN W/MINERALS CH
1.0000 | ORAL_TABLET | Freq: Every day | ORAL | Status: DC
  Administered 2018-10-21 – 2018-10-23 (×3): 1 via ORAL
  Filled 2018-10-20 (×3): qty 1

## 2018-10-20 MED ORDER — POTASSIUM CHLORIDE 20 MEQ PO PACK
20.0000 meq | PACK | Freq: Once | ORAL | Status: AC
  Administered 2018-10-21: 02:00:00 20 meq via ORAL
  Filled 2018-10-20: qty 1

## 2018-10-20 MED ORDER — SERTRALINE HCL 50 MG PO TABS
100.0000 mg | ORAL_TABLET | Freq: Every day | ORAL | Status: DC
  Administered 2018-10-21 – 2018-10-23 (×3): 100 mg via ORAL
  Filled 2018-10-20 (×3): qty 2

## 2018-10-20 MED ORDER — THIAMINE HCL 100 MG/ML IJ SOLN: 100.0000 mg | Freq: Every day | INTRAMUSCULAR | Status: DC

## 2018-10-20 MED ORDER — DOCUSATE SODIUM 100 MG PO CAPS
100.0000 mg | ORAL_CAPSULE | Freq: Two times a day (BID) | ORAL | Status: DC
  Administered 2018-10-21 – 2018-10-23 (×4): 100 mg via ORAL
  Filled 2018-10-20 (×5): qty 1

## 2018-10-20 MED ORDER — ACETAMINOPHEN 325 MG PO TABS
650.0000 mg | ORAL_TABLET | Freq: Four times a day (QID) | ORAL | Status: DC | PRN
  Administered 2018-10-21 – 2018-10-23 (×3): 650 mg via ORAL
  Filled 2018-10-20 (×3): qty 2

## 2018-10-20 MED ORDER — LISINOPRIL 10 MG PO TABS
10.0000 mg | ORAL_TABLET | Freq: Every day | ORAL | Status: DC
  Administered 2018-10-21 – 2018-10-23 (×3): 10 mg via ORAL
  Filled 2018-10-20 (×3): qty 1

## 2018-10-20 MED ORDER — LORAZEPAM 1 MG PO TABS
1.0000 mg | ORAL_TABLET | Freq: Four times a day (QID) | ORAL | Status: DC | PRN
  Administered 2018-10-21 – 2018-10-23 (×4): 1 mg via ORAL
  Filled 2018-10-20 (×4): qty 1

## 2018-10-20 MED ORDER — ACETAMINOPHEN 650 MG RE SUPP: 650.0000 mg | Freq: Four times a day (QID) | RECTAL | Status: DC | PRN

## 2018-10-20 MED ORDER — FUROSEMIDE 20 MG PO TABS
20.0000 mg | ORAL_TABLET | Freq: Every day | ORAL | Status: DC
  Administered 2018-10-21 – 2018-10-23 (×3): 20 mg via ORAL
  Filled 2018-10-20 (×3): qty 1

## 2018-10-20 MED ORDER — NEBIVOLOL HCL 10 MG PO TABS
10.0000 mg | ORAL_TABLET | Freq: Every day | ORAL | Status: DC
  Administered 2018-10-21 – 2018-10-23 (×3): 10 mg via ORAL
  Filled 2018-10-20 (×3): qty 1

## 2018-10-20 MED ORDER — ONDANSETRON HCL 4 MG PO TABS: 4.0000 mg | ORAL_TABLET | Freq: Four times a day (QID) | ORAL | Status: DC | PRN

## 2018-10-20 MED ORDER — LORAZEPAM 2 MG/ML IJ SOLN: 1.0000 mg | Freq: Four times a day (QID) | INTRAMUSCULAR | Status: DC | PRN

## 2018-10-20 MED ORDER — PANTOPRAZOLE SODIUM 40 MG PO TBEC
40.0000 mg | DELAYED_RELEASE_TABLET | Freq: Every day | ORAL | Status: DC
  Administered 2018-10-21 – 2018-10-23 (×3): 40 mg via ORAL
  Filled 2018-10-20 (×3): qty 1

## 2018-10-20 MED ORDER — AMLODIPINE BESYLATE 10 MG PO TABS
10.0000 mg | ORAL_TABLET | Freq: Every day | ORAL | Status: DC
  Administered 2018-10-21 – 2018-10-23 (×3): 10 mg via ORAL
  Filled 2018-10-20 (×3): qty 1

## 2018-10-20 NOTE — ED Notes (Signed)
Assisted patient to walk to room commode. Patient was unsteady upon transitioning from sitting to standing and was initially unsteady in his gait, but became steadier with ambulation and standing at the commode.

## 2018-10-20 NOTE — ED Notes (Signed)
Patient c/o double vision in right eye when looking to the right. Patient reports feeling dizzy when he is walking, but not at rest.

## 2018-10-20 NOTE — ED Provider Notes (Addendum)
[facility] Emergency Department Provider Note  ____________________________________________   I have reviewed the triage vital signs and the nursing notes. Where available I have reviewed prior notes and, if possible and indicated, outside hospital notes.    HISTORY  Chief Complaint Diplopia  Patient seen and evaluated during the coronavirus epidemic during a time with low staffing  HPI Cameron Foster. is a 78 y.o. male  Who was recently seen and transferred to another facility after being diagnosed by me actually with a acute head bleed.  Patient at that time his presenting complaint was diplopia which had resolved by the time he arrived at the outpatient facility.  He does not have a headache.  He states he was cleared there and I am able to review his notes he was discharged shortly thereafter after evaluation by their team including MRI.  Patient states that after going home is been fine for the last week and then today he began to notice that when he woke up this morning he had diplopia only when he looks to the right leg.  This is a same presentation he had before.  No headache, no stiff neck, no nausea no vomiting no diarrhea, no focal numbness or weakness noted poor balance no difficulty speaking no other complaints.  Please see my prior note.  I have reviewed his prior medical history see below.  Patient does not on any anticoagulation.  This his symptoms this morning when he woke up.   Past Medical History:  Diagnosis Date  . Alcoholism ([place])   . Arthritis   . Atrial fibrillation ([place])    one episode  . Colon polyp   . Depression    Son died June 16, 2014  . Diverticulitis   . Diverticulosis 30 years  . Dysrhythmia    At Fib 1995  . GERD (gastroesophageal reflux disease)   . Hyperlipidemia   . Hypertension   . Irritable bowel syndrome   . Prostate cancer ([employer]) 2010-06-16   treated with radiation therapy. Prostate  . Vasovagal syncope     Patient Active  Problem List   Diagnosis Date Noted  . SAH (subarachnoid hemorrhage) ([place]) 10/16/2018  . Recurrent sinusitis 10/16/2018  . Low libido 08/30/2018  . Class 1 obesity without serious comorbidity with body mass index (BMI) of 31.0 to 31.9 in adult 05/14/2018  . Mixed hyperlipidemia 05/14/2018  . Degeneration of lumbar intervertebral disc 03/22/2018  . Osteoarthritis of hip 03/22/2018  . Trochanteric bursitis of right hip 03/22/2018  . Status post partial colectomy 01/03/2017  . Depression, major, single episode, mild ([place]) 01/03/2017  . Vasovagal syncope 01/27/2016  . History of skin cancer 10/28/2015  . Osteoarthritis of right knee 10/01/2015  . Essential hypertension 08/25/2015  . Dyspnea on exertion 08/12/2015  . Erectile dysfunction following radiation therapy 08/04/2015  . Constipation 08/02/2015  . History of prostate cancer 01/20/2015  . Swelling of right lower extremity 01/20/2015    Past Surgical History:  Procedure Laterality Date  . [place] no stents  . CATARACT EXTRACTION, BILATERAL    . COLON RESECTION SIGMOID N/A 12/07/2016   Procedure: COLON RESECTION SIGMOID;  Surgeon: Clayburn Pert, MD;  Location: [facility] ORS;  Service: General;  Laterality: N/A;  . COLON SURGERY  11/2016   Colostomy  . COLONOSCOPY  06/16/14  . COLONOSCOPY WITH PROPOFOL N/A 05/30/2016   Procedure: COLONOSCOPY WITH PROPOFOL;  Surgeon: Lollie Sails, MD;  Location: [facility] ENDOSCOPY;  Service: Endoscopy;  Laterality: N/A;  . COLOSTOMY Left 12/07/2016   Procedure: COLOSTOMY;  Surgeon: Clayburn Pert, MD;  Location: [facility] ORS;  Service: General;  Laterality: Left;  . COLOSTOMY REVERSAL N/A 03/21/2017   Procedure: COLOSTOMY REVERSAL;  Surgeon: Clayburn Pert, MD;  Location: [facility] ORS;  Service: General;  Laterality: N/A;  . COLOSTOMY TAKEDOWN N/A 03/21/2017   Procedure: LAPAROSCOPIC COLOSTOMY TAKEDOWN;  Surgeon: Clayburn Pert, MD;  Location: [facility] ORS;  Service:  General;  Laterality: N/A;  . CYSTOSCOPY WITH STENT PLACEMENT Bilateral 03/21/2017   Procedure: CYSTOSCOPY WITH LIGHTED STENT PLACEMENT;  Surgeon: Abbie Sons, MD;  Location: [facility] ORS;  Service: Urology;  Laterality: Bilateral;  . ESOPHAGOGASTRODUODENOSCOPY    . ESOPHAGOGASTRODUODENOSCOPY N/A 05/30/2016   Procedure: ESOPHAGOGASTRODUODENOSCOPY (EGD);  Surgeon: Lollie Sails, MD;  Location: [facility] ENDOSCOPY;  Service: Endoscopy;  Laterality: N/A;  . EYE SURGERY     cataracts  . FLEXIBLE SIGMOIDOSCOPY N/A 03/21/2017   Procedure: FLEXIBLE SIGMOIDOSCOPY;  Surgeon: Clayburn Pert, MD;  Location: [facility] ORS;  Service: General;  Laterality: N/A;  . FRACTURE SURGERY Bilateral    right arm and left wrist  . INCISION AND DRAINAGE ABSCESS N/A 12/07/2016   Procedure: DRAINAGE  OF INTRA ABDOMINAL ABSCESS;  Surgeon: Clayburn Pert, MD;  Location: [facility] ORS;  Service: General;  Laterality: N/A;  . KNEE ARTHROSCOPY    . LAPAROTOMY N/A 12/07/2016   Procedure: EXPLORATORY LAPAROTOMY;  Surgeon: Clayburn Pert, MD;  Location: [facility] ORS;  Service: General;  Laterality: N/A;  . PROSTATE SURGERY     Microwave therapy  . TONSILLECTOMY    . TOTAL KNEE ARTHROPLASTY Right 07/27/2016   Procedure: TOTAL KNEE ARTHROPLASTY;  Surgeon: [name], MD;  Location: [facility] ORS;  Service: Orthopedics;  Laterality: Right;  Dr. Erlene Quan had to place Urinary catheter due to prostate cancer history.  Using flexible scope.    Prior to Admission medications   Medication Sig Start Date End Date Taking? Authorizing Provider  amLODipine (NORVASC) 10 MG tablet Take 1 tablet (10 mg total) by mouth daily for 30 days. 10/14/18 11/13/18  Arta Silence, MD  docusate sodium (STOOL SOFTENER) 100 MG capsule Take 100 mg by mouth 2 (two) times daily.    [provider]  doxycycline (VIBRA-TABS) 100 MG tablet Take 1 tablet (100 mg total) by mouth 2 (two) times daily. 10/16/18   [name], MD  fluticasone (FLONASE) 50  MCG/ACT nasal spray Place 1 spray into both nostrils daily as needed for allergies or rhinitis.    [provider]  furosemide (LASIX) 20 MG tablet Take 1 tablet (20 mg total) by mouth daily. 05/02/18 07/31/18  End, Harrell Gave, MD  hydrocortisone 2.5 % cream APPLY CREAM TO AFFECTED AREA TWICE DAILY AS NEEDED FOR RASH 05/28/18   Bacigalupo, Dionne Bucy, MD  lisinopril (PRINIVIL,ZESTRIL) 10 MG tablet TAKE 1 TABLET BY MOUTH EVERY DAY 07/06/18   Bacigalupo, Dionne Bucy, MD  liver oil-zinc oxide (DESITIN) 40 % ointment Apply 1 application topically daily as needed for irritation.    [provider]  meloxicam (MOBIC) 15 MG tablet Take 15 mg by mouth daily.  04/19/18   [provider]  nebivolol (BYSTOLIC) 10 MG tablet Take 1 tablet (10 mg total) by mouth daily. 05/02/18   End, Harrell Gave, MD  omeprazole (PRILOSEC) 20 MG capsule Take 20 mg by mouth daily as needed (heartburn).     [provider]  sertraline (ZOLOFT) 100 MG tablet Take 1 tablet (100 mg total) by mouth daily. 05/30/18   Lavon Paganini  M, MD    Allergies Augmentin [amoxicillin-pot clavulanate], Formaldehyde, and Tape  Family History  Problem Relation Age of Onset  . Lung cancer Father        smoker  . Other Mother   . Sudden death Son        due to "Blood clots"  . Bipolar disorder Son   . Kidney disease Daughter        congenital one small kidney  . Prostate cancer Neg Hx   . Bladder Cancer Neg Hx     Social History Social History   Tobacco Use  . Smoking status: Former Smoker    Packs/day: 1.00    Years: 27.00    Pack years: 27.00    Types: Cigarettes    Quit date: 04/18/1988    Years since quitting: 30.5  . Smokeless tobacco: Former Systems developer    Types: Chew  Substance Use Topics  . Alcohol use: Yes    Alcohol/week: 3.0 standard drinks    Types: 3 Standard drinks or equivalent per week    Comment: Occasional- Former Heavy ETOH Use  . Drug use: No    Review of Systems Constitutional: No  fever/chills Eyes: No visual changes. ENT: No sore throat. No stiff neck no neck pain Cardiovascular: Denies chest pain. Respiratory: Denies shortness of breath. Gastrointestinal:   no vomiting.  No diarrhea.  No constipation. Genitourinary: Negative for dysuria. Musculoskeletal: Negative lower extremity swelling Skin: Negative for rash. Neurological: Negative for severe headaches, focal weakness or numbness.   ____________________________________________   PHYSICAL EXAM:  VITAL SIGNS: ED Triage Vitals [10/20/18 1856]  Enc Vitals Group     BP 140/78     Pulse Rate 74     Resp 18     Temp 99.4 F (37.4 C)     Temp Source Oral     SpO2 97 %     Weight      Height      Head Circumference      Peak Flow      Pain Score 0     Pain Loc      Pain Edu?      Excl. in [place]?     Constitutional: Alert and oriented. Well appearing and in no acute distress. Eyes: Conjunctivae are normal Head: Atraumatic HEENT: No congestion/rhinnorhea. Mucous membranes are moist.  Oropharynx non-erythematous Neck:   Nontender with no meningismus, no masses, no stridor Cardiovascular: Normal rate, regular rhythm. Grossly normal heart sounds.  Good peripheral circulation. Respiratory: Normal respiratory effort.  No retractions. Lungs CTAB. Abdominal: Soft and nontender. No distention. No guarding no rebound Back:  There is no focal tenderness or step off.  there is no midline tenderness there are no lesions noted. there is no CVA tenderness Musculoskeletal: No lower extremity tenderness, no upper extremity tenderness. No joint effusions, no DVT signs strong distal pulses no edema Neurologic:  Normal speech and language.  Patient has trouble with his rectus muscle, he has difficulty moving it past midline, otherwise, cranial nerves II through XII are grossly intact he and he has a normal neurologic exam with all speech normal strength no cerebellar signs etc. Skin:  Skin is warm, dry and intact. No rash  noted. Psychiatric: Mood and affect are normal. Speech and behavior are normal.  ____________________________________________   LABS (all labs ordered are listed, but only abnormal results are displayed)  Labs Reviewed  CBC WITH DIFFERENTIAL/PLATELET  BASIC METABOLIC PANEL  ETHANOL  PROTIME-INR    Pertinent  labs  results that were available during my care of the patient were reviewed by me and considered in my medical decision making (see chart for details). ____________________________________________  EKG  I personally interpreted any EKGs ordered by me or triage  ____________________________________________  RADIOLOGY  Pertinent labs & imaging results that were available during my care of the patient were reviewed by me and considered in my medical decision making (see chart for details). If possible, patient and/or family made aware of any abnormal findings.  Ct Head Wo Contrast  Result Date: 10/20/2018 CLINICAL DATA:  Neuro deficit(s), subacute. Double vision. Patient reports recent stroke. EXAM: CT HEAD WITHOUT CONTRAST TECHNIQUE: Contiguous axial images were obtained from the base of the skull through the vertex without intravenous contrast. COMPARISON:  Head CT 10/14/2018 trauma 10/10/2018 FINDINGS: Brain: Previous extra-axial hemorrhage along the pons and medulla has resolved without definite residual. No new or acute hemorrhage. No evidence of acute ischemia. Unchanged remote bilateral cerebellar infarcts in the cone or infarct in the right thalamus. Unchanged degree of atrophy and chronic small vessel ischemia. No midline shift or mass effect. No hydrocephalus. Vascular: Atherosclerotic vascular calcifications without hyperdense vessel. Skull: No fracture or focal lesion. Sinuses/Orbits: Paranasal sinuses and mastoid air cells are clear. The visualized orbits are unremarkable. Other: None. IMPRESSION: 1. No acute intracranial abnormality. 2. Resolved right pontinomedullary  cistern extra-axial hemorrhage from prior exam. 3. Unchanged atrophy, chronic small vessel ischemia, and remote lacunar infarcts. Electronically Signed   By: Keith Rake M.D.   On: 10/20/2018 19:39   ____________________________________________    PROCEDURES  Procedure(s) performed: None  Procedures  Critical Care performed: None  ____________________________________________   INITIAL IMPRESSION / ASSESSMENT AND PLAN / ED COURSE  Pertinent labs & imaging results that were available during my care of the patient were reviewed by me and considered in my medical decision making (see chart for details).  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  At that time he had a head bleed.  Is unclear if there was a connection between the 2 aside from chronologic.  Extensive work-up showed no aneurysmal events.  Patient was discharged home.  He is not having any head pain at this time.  I do not see any other evidence of neurologic dysfunction.  His CT scan shows no recurrent bleed.  He has no indication for TPA for multiple obvious reasons including recent head bleed and timing.  We will check basic blood work and talk to neurosurgery about what they would like to do for this recurrent lateral rectus muscle likely abducens nerve issue.  Patient has intact vision to confrontation and aside from when he looks to the right no diplopia  ----------------------------------------- 10:20 PM on 10/20/2018 -----------------------------------------  Patient remains asymptomatic aside from when he looks to the right he has diplopia.  I talked to Dr. Rory Percy of our neurology service, he recommends I talked to [facility] neurosurgery as the patient was just on [facility] surgery service.  We are paging them.  Neurology here feels that there is a chance he could have an outpatient work-up for this depending on what  neurosurgery wants to do  ----------------------------------------- 11:13 PM on 10/20/2018 -----------------------------------------  Discussed with neurosurgery at [facility], Dr. Lysle Morales appreciate consult he feels that there is no neurosurgical issue at this time, which I agree with.  He states the patient was mostly taken care of  by neurology when he was in their service last time and they were consulting service.  I will call [place] neurology.  Patient being kept aware of findings and progress to the hospital system here and there is no progression of symptoms he actually feels slightly better in terms of his diplopia . ----------------------------------------- 11:40 PM on 10/20/2018 -----------------------------------------  Cussed with Dr. Gabriel Earing at [facility] neurology appreciate consult, he will talk to his attending to see whether they would like him shift to [facility].  In the meantime however, I have also talked to Levada Dy, nurse practitioner here, who is on the admitting service and they will admit him if [facility] deems that he is a feel his care would be more appropriately further here.  No other further intervention requested by any of the neurologists or neurosurgeons to whom I have spoken. Signed out to dr. Alfred Levins at the end of my shift.    ____________________________________________   FINAL CLINICAL IMPRESSION(S) / ED DIAGNOSES  Final diagnoses:  None      This chart was dictated using voice recognition software.  Despite best efforts to proofread,  errors can occur which can change meaning.      Schuyler Amor, MD 10/20/18 2127    Schuyler Amor, MD 10/20/18 2226    Schuyler Amor, MD 10/20/18 2314    Schuyler Amor, MD 10/20/18 [phone]

## 2018-10-20 NOTE — ED Triage Notes (Signed)
Pt to ED via POV stating that he was seen last week at [facility] for Stroke, pt states that this morning when he woke up around 0700 he started having double vision again. Pt states that it is not as bad as when he had his stroke but it has not went away. Pt has no slurred speech, pt denies numbness or weakness. Pt is in NAD.

## 2018-10-21 ENCOUNTER — Encounter: Payer: Self-pay | Admitting: Radiology

## 2018-10-21 ENCOUNTER — Inpatient Hospital Stay: Payer: Medicare Other

## 2018-10-21 DIAGNOSIS — H532 Diplopia: Secondary | ICD-10-CM

## 2018-10-21 LAB — T4, FREE: Free T4: 0.66 ng/dL (ref 0.61–1.12)

## 2018-10-21 LAB — BASIC METABOLIC PANEL
Anion gap: 5 (ref 5–15)
BUN: 9 mg/dL (ref 8–23)
CO2: 25 mmol/L (ref 22–32)
Calcium: 8.9 mg/dL (ref 8.9–10.3)
Chloride: 106 mmol/L (ref 98–111)
Creatinine, Ser: 1.02 mg/dL (ref 0.61–1.24)
GFR calc Af Amer: >60 mL/min (ref >60)
GFR calc non Af Amer: >60 mL/min (ref >60)
Glucose, Bld: 94 mg/dL (ref 70–99)
Potassium: 3.5 mmol/L (ref 3.5–5.1)
Sodium: 136 mmol/L (ref 135–145)

## 2018-10-21 LAB — CBC
HCT: 34.6 % — ABNORMAL LOW (ref 39.0–52.0)
Hemoglobin: 11 g/dL — ABNORMAL LOW (ref 13.0–17.0)
MCH: 27.6 pg (ref 26.0–34.0)
MCHC: 31.8 g/dL (ref 30.0–36.0)
MCV: 86.7 fL (ref 80.0–100.0)
Platelets: 250 10*3/uL (ref 150–400)
RBC: 3.99 MIL/uL — ABNORMAL LOW (ref 4.22–5.81)
RDW: 14 % (ref 11.5–15.5)
WBC: 5.8 10*3/uL (ref 4.0–10.5)
nRBC: 0 % (ref 0.0–0.2)

## 2018-10-21 LAB — SEDIMENTATION RATE: Sed Rate: 39 mm/hr — ABNORMAL HIGH (ref 0–20)

## 2018-10-21 LAB — TSH: TSH: 5.605 u[IU]/mL — ABNORMAL HIGH (ref 0.350–4.500)

## 2018-10-21 LAB — HEMOGLOBIN A1C
Hgb A1c MFr Bld: 5.3 % (ref 4.8–5.6)
Mean Plasma Glucose: 105.41 mg/dL

## 2018-10-21 LAB — MRSA PCR SCREENING: MRSA by PCR: NEGATIVE

## 2018-10-21 IMAGING — MR MRI HEAD WITHOUT AND WITH CONTRAST
14 series · 48 of 48 positions shown · IV contrast (gadavist)
Comparison: CT studies [DATE] and [DATE]. Other prior CT
examinations from [facility] of this year.

CLINICAL DATA: Recent diagnosis of stroke. Worsening double vision.

EXAM:
MRI HEAD WITHOUT AND WITH CONTRAST
TECHNIQUE: Multiplanar, multiecho pulse sequences of the brain and surrounding
structures were obtained without and with intravenous contrast.
CONTRAST:  9 cc Gadavist

[Series 5: ax dwi_tracew · axial · 3.0mm · 0.60mm/px · z∈[-117,+31]mm · 2 of 52 slices shown]
[im 1/52]
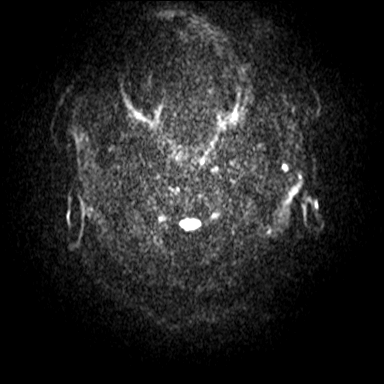
[im 52/52]
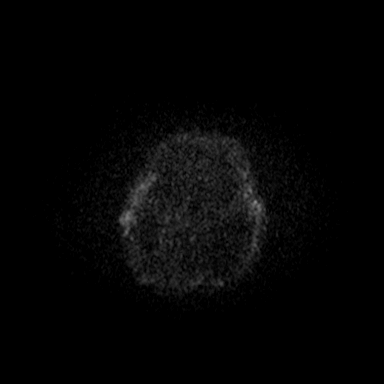

[Series 6: ax dwi_adc · axial · 3.0mm · 0.60mm/px · z∈[-117,+31]mm · 3 of 52 slices shown]
[im 1/52]
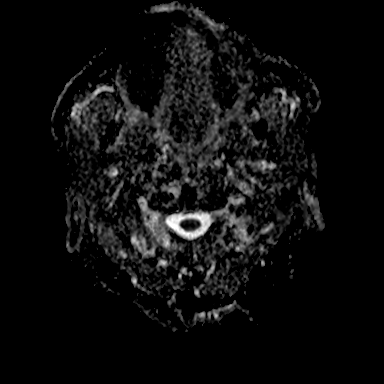
[im 26/52]
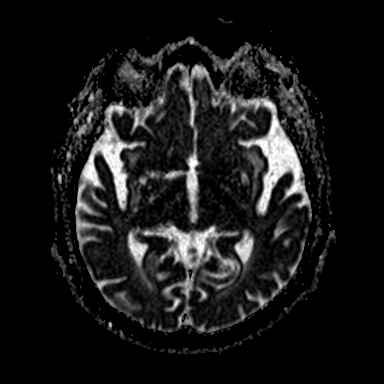
[im 52/52]
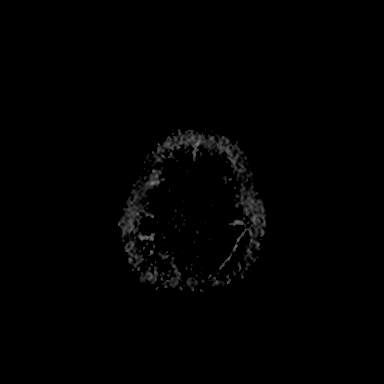

[Series 7: cor dwi_tracew · coronal · 5.0mm · 0.60mm/px · 2 of 37 slices shown]
[im 1/37]
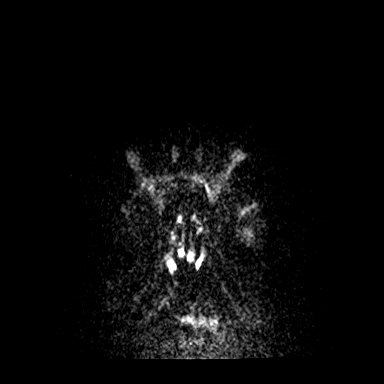
[im 37/37]
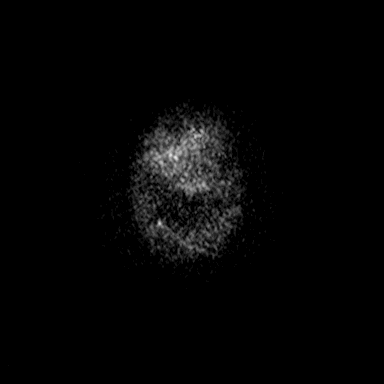

[Series 8: cor dwi_adc · coronal · 5.0mm · 0.60mm/px · 2 of 37 slices shown]
[im 1/37]
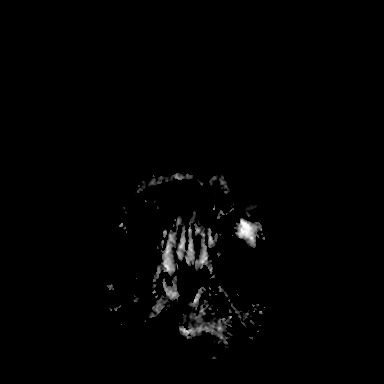
[im 37/37]
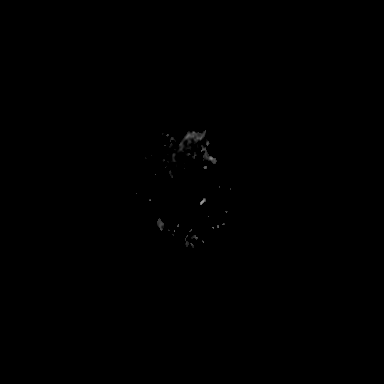

[Series 9: T1 · sagittal · 5.0mm · 0.62mm/px · 1 of 22 slices shown (1 of 2)]
[im 1/22]
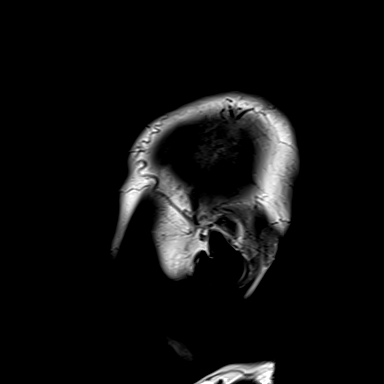

[Series 10: T2 · axial · 5.0mm · 0.53mm/px · 1 of 25 slices shown]
[im 1/25]
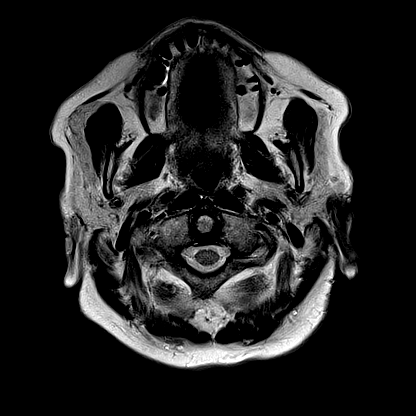

[Series 11: mag_images · axial · 3.0mm · 0.90mm/px · z∈[-130,+41]mm · 4 of 60 slices shown]
[im 1/60]
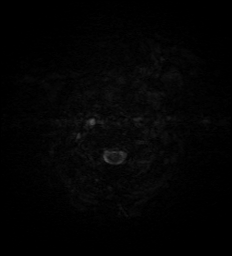
[im 20/60]
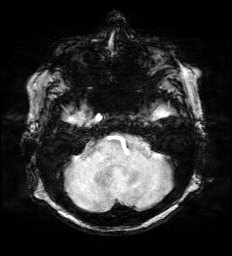
[im 40/60]
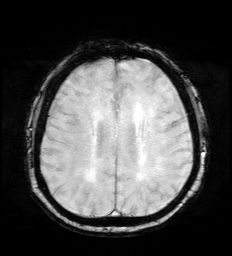
[im 60/60]
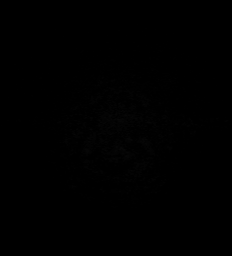

[Series 12: pha_images · axial · 3.0mm · 0.90mm/px · z∈[-130,+41]mm · 4 of 60 slices shown]
[im 1/60]
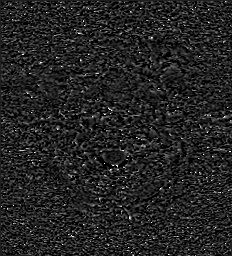
[im 20/60]
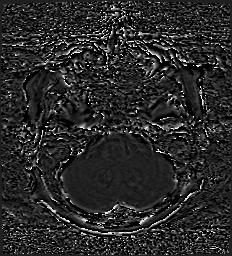
[im 40/60]
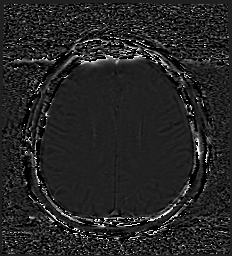
[im 60/60]
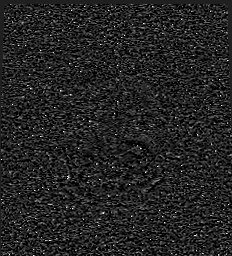

[Series 13: swi_images · axial · 3.0mm · 0.90mm/px · z∈[-130,+41]mm · 4 of 60 slices shown]
[im 1/60]
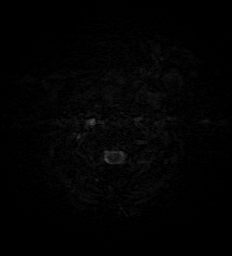
[im 20/60]
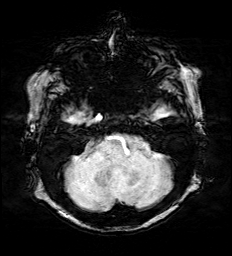
[im 40/60]
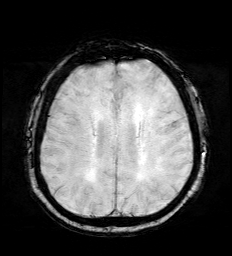
[im 60/60]
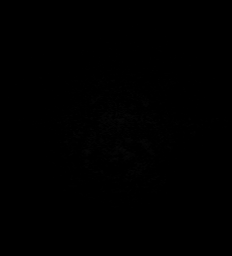

[Series 15: FLAIR · axial · 3.0mm · 0.53mm/px · z∈[-121,+35]mm · 3 of 55 slices shown]
[im 1/55]
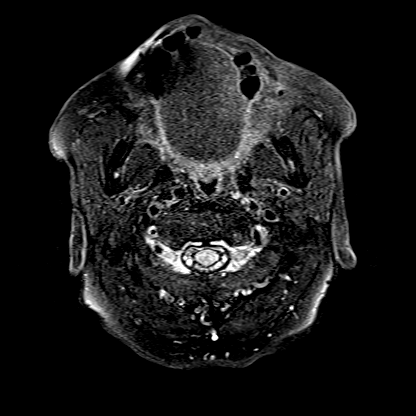
[im 28/55]
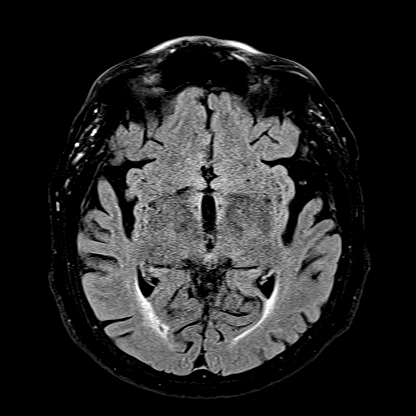
[im 55/55]
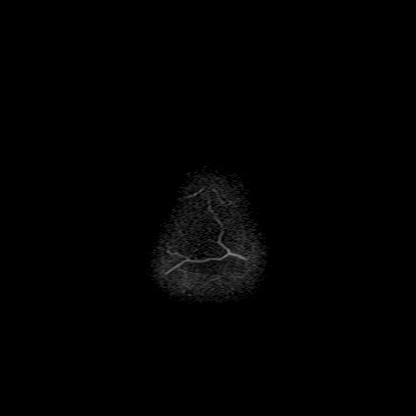

[Series 16: T1 · axial · 1.0mm · 0.98mm/px · z∈[-116,+22]mm · 9 of 144 slices shown (2 of 2)]
[im 1/144]
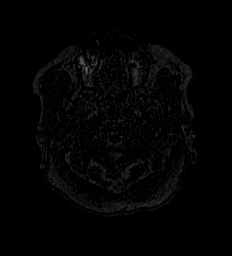
[im 18/144]
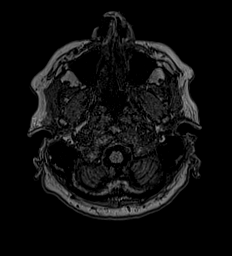
[im 36/144]
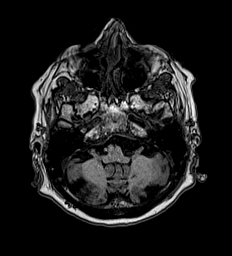
[im 54/144]
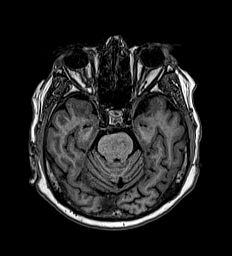
[im 72/144]
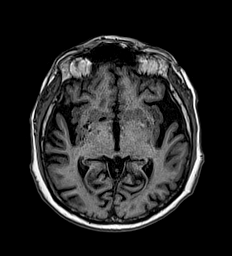
[im 90/144]
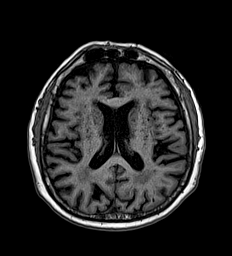
[im 108/144]
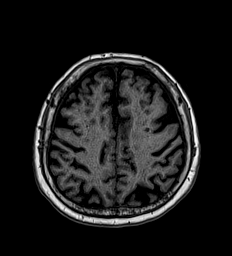
[im 126/144]
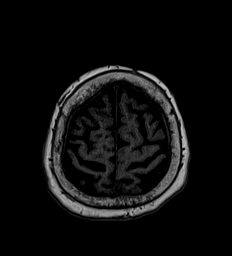
[im 144/144]
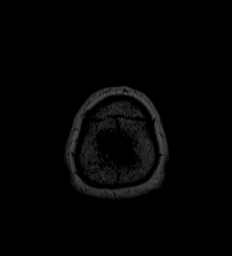

[Series 17: T2 post-contrast · coronal · 5.0mm · 0.57mm/px · 2 of 29 slices shown]
[im 1/29]
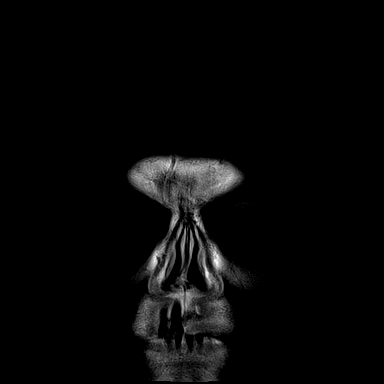
[im 29/29]
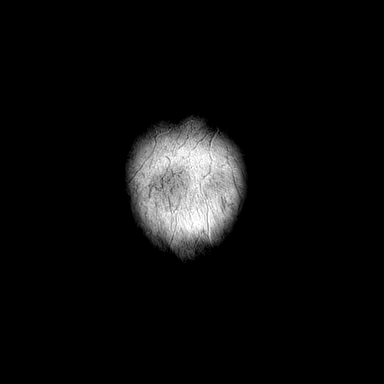

[Series 18: T1 post-contrast · axial · 1.0mm · 0.98mm/px · z∈[-116,+22]mm · 9 of 144 slices shown (1 of 2)]
[im 1/144]
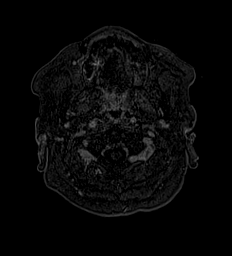
[im 18/144]
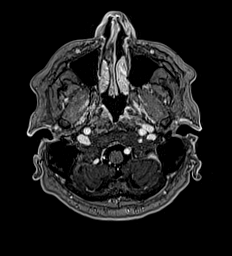
[im 36/144]
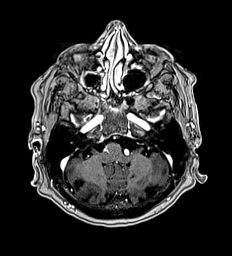
[im 54/144]
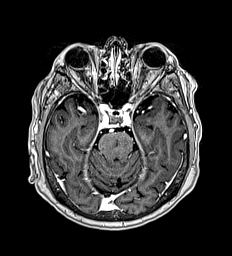
[im 72/144]
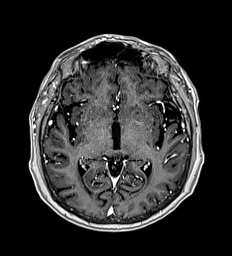
[im 90/144]
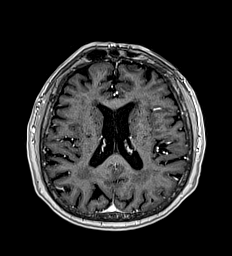
[im 108/144]
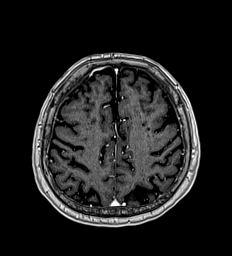
[im 126/144]
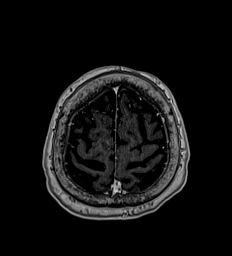
[im 144/144]
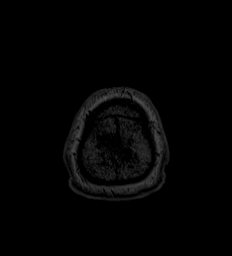

[Series 19: T1 post-contrast · coronal · 5.0mm · 0.57mm/px · 2 of 29 slices shown (2 of 2)]
[im 1/29]
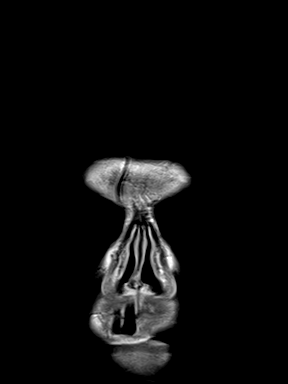
[im 29/29]
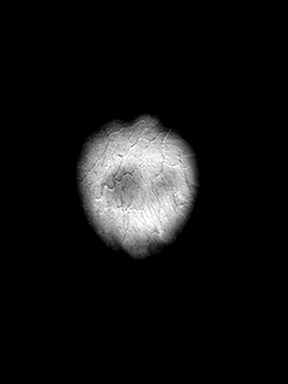

[48 of 48 positions shown; findings below may reference images not displayed]

FINDINGS: Brain: Diffusion imaging does not show any acute or subacute
infarction. There chronic small-vessel ischemic changes of the pons.
There are numerous old small vessel cerebellar infarctions affecting
both cerebellar hemispheres. Cerebral hemispheres show old
infarctions affecting the thalami in scattered within the deep and
subcortical white matter. No large vessel territory infarction. No
mass lesion. No intra-axial hemorrhage. Few punctate foci of
hemosiderin deposition associated with some of the old small vessel
infarctions. Previously seen extra-axial hemorrhage in the
perimesencephalic cistern/foramen magnum on the right in late [REDACTED]
is no longer visible.

Vascular: Major vessels at the base of the brain show flow. Right
vertebral artery appears to terminate in PICA.

Skull and upper cervical spine: Negative

Sinuses/Orbits: Clear/normal

Other: None
IMPRESSION: No acute or subacute finding. Extensive old infarctions throughout
the brain as outlined above. Resolution of previously seen
subarachnoid hemorrhage at base of the brain on the right in [REDACTED].

## 2018-10-21 IMAGING — CT CT ANGIOGRAPHY HEAD
3 of 7 series · 19 of 47 positions shown · IV contrast (APPLIED)
Comparison: [DATE] CT head.

CLINICAL DATA: 78 y/o M; history of intracranial hemorrhage for
follow-up.

EXAM:
CT ANGIOGRAPHY HEAD
TECHNIQUE: Multidetector CT imaging of the head was performed using the
standard protocol during bolus administration of intravenous
contrast. Multiplanar CT image reconstructions and MIPs were
obtained to evaluate the vascular anatomy.
CONTRAST:  75mL OMNIPAQUE IOHEXOL 350 MG/ML SOLN

[Series 7: ax thin · axial · 0.36mm/px · z∈[+294,+444]mm · 13 of 180 slices shown]
[im 11/180  brain]
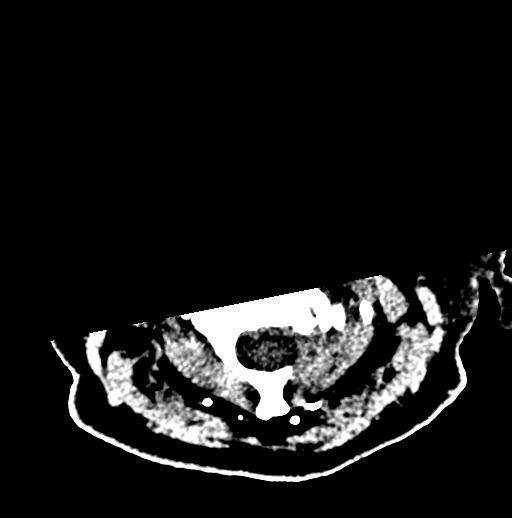
[im 22/180  bone]
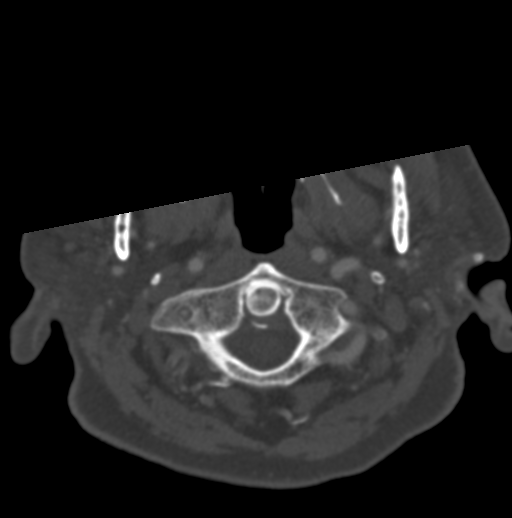
[im 43/180  brain]
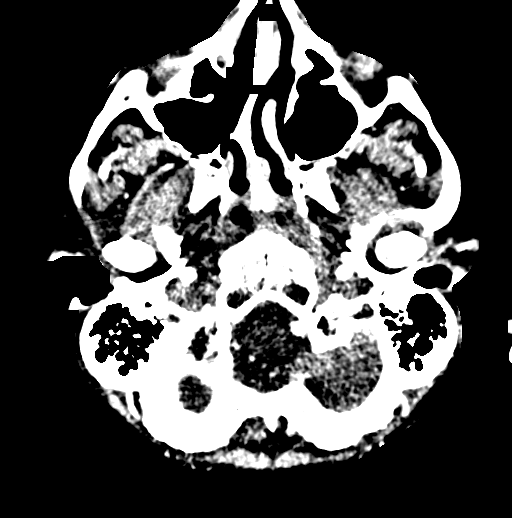
[im 53/180  bone]
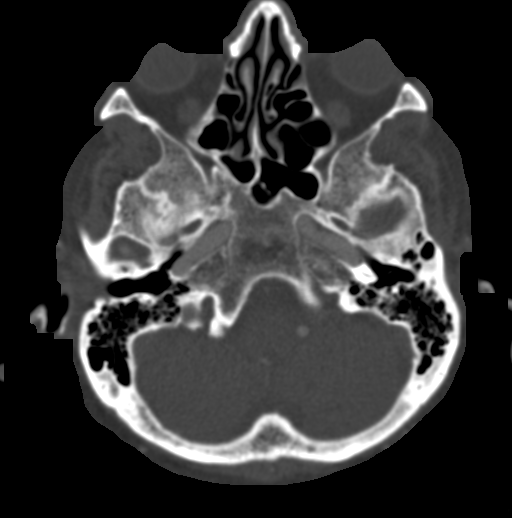
[im 64/180  brain]
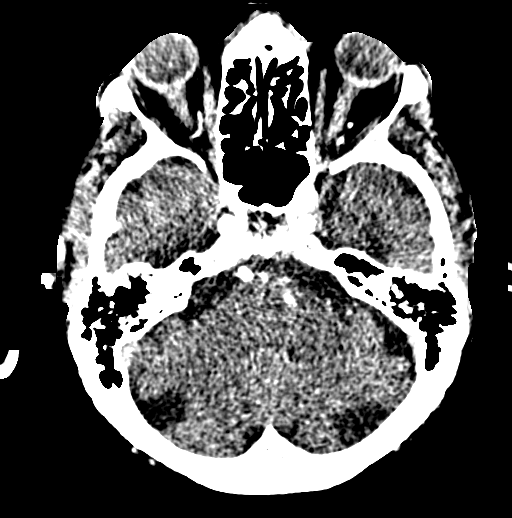
[im 74/180  bone]
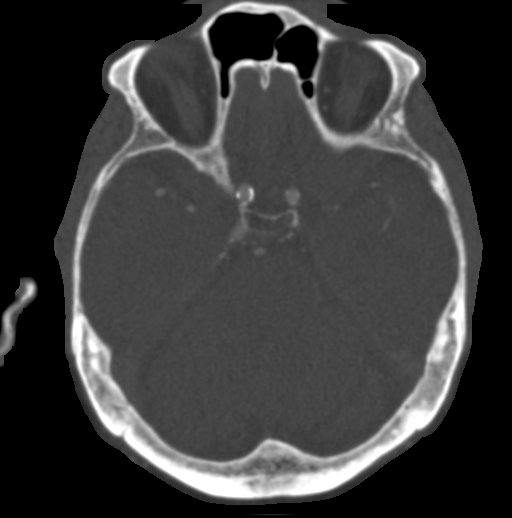
[im 95/180  brain]
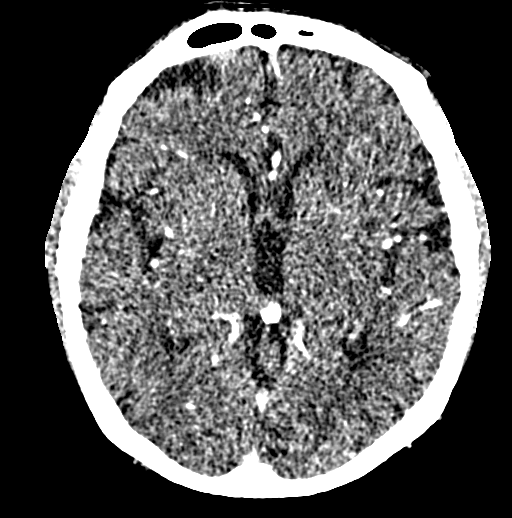
[im 106/180  bone]
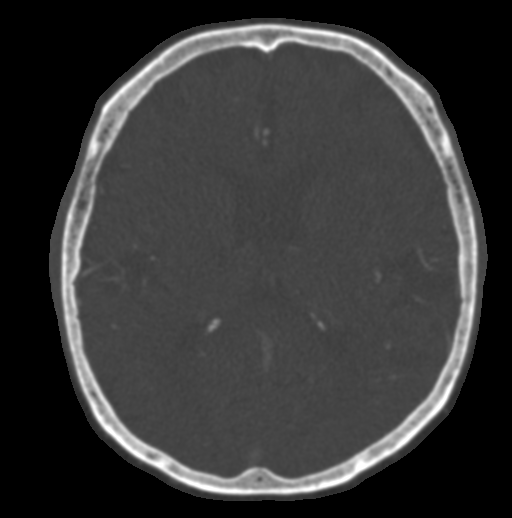
[im 116/180  brain]
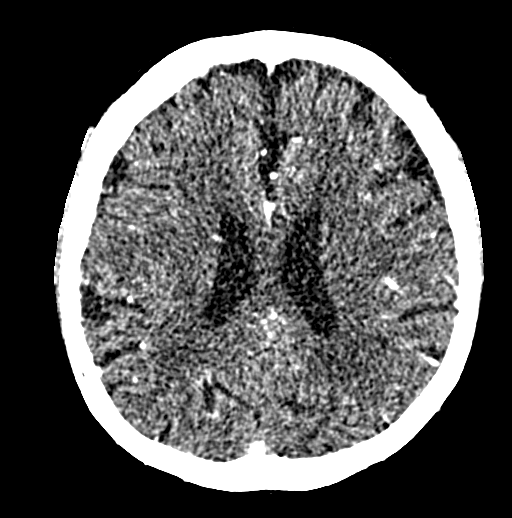
[im 127/180  bone]
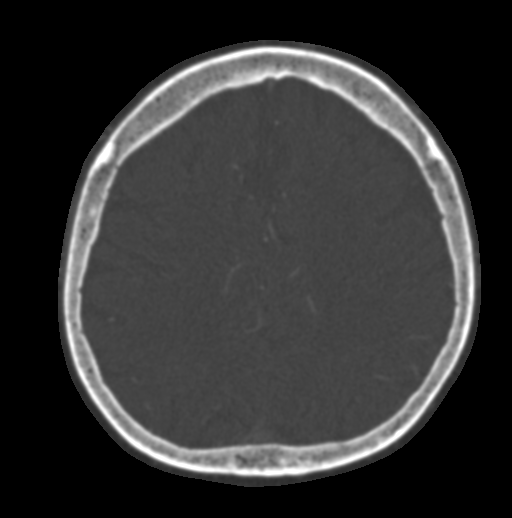
[im 137/180  brain]
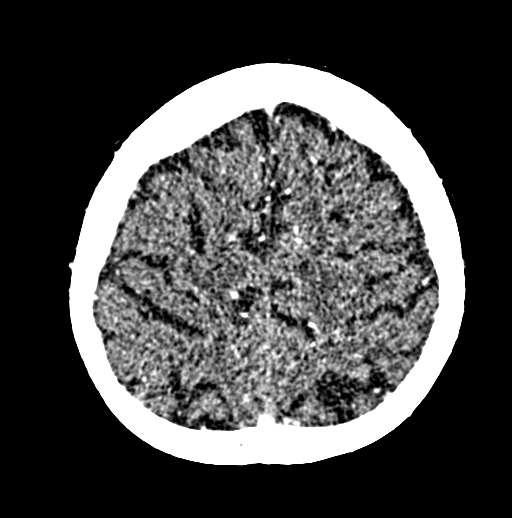
[im 158/180  bone]
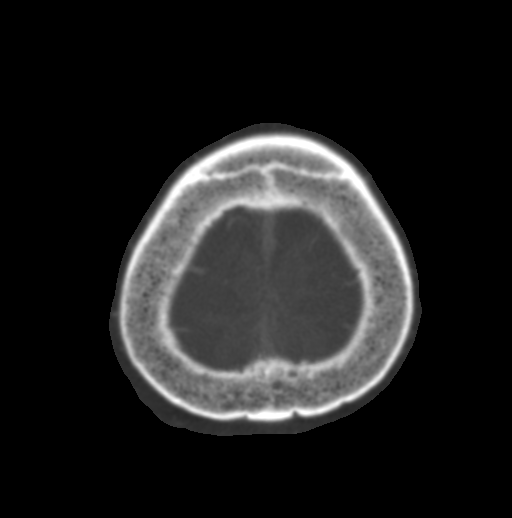
[im 169/180  brain]
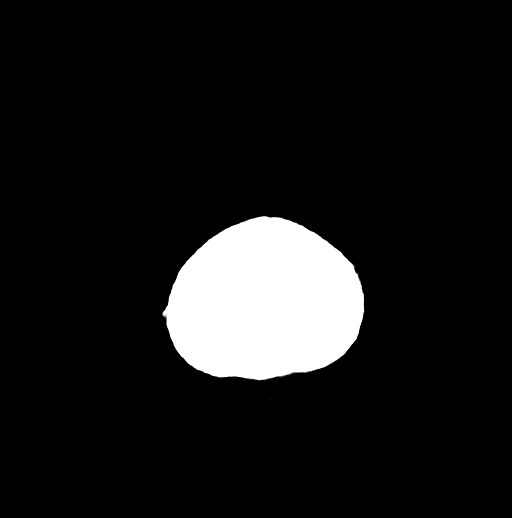

[Series 8: cor thin · coronal · 0.34mm/px · 3 of 179 slices shown]
[im 51/179  brain]
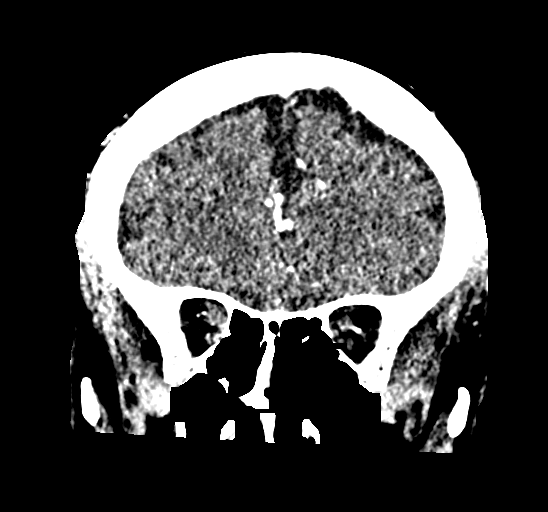
[im 77/179  brain]
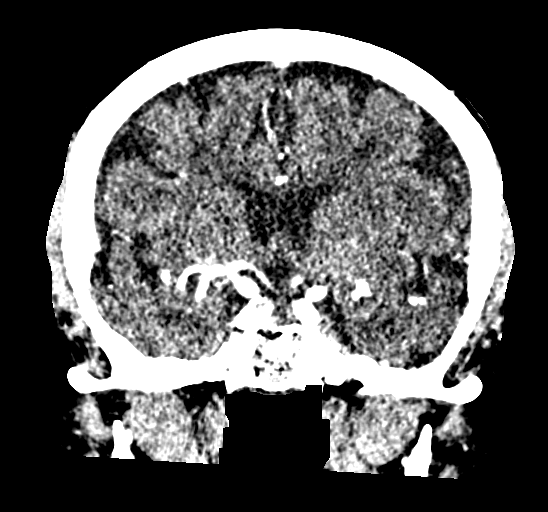
[im 102/179  brain]
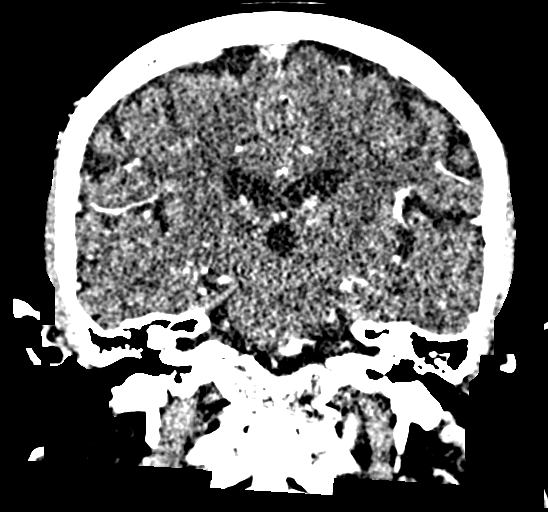

[Series 10: sag thin · sagittal · 0.36mm/px · 3 of 166 slices shown]
[im 34/166  brain]
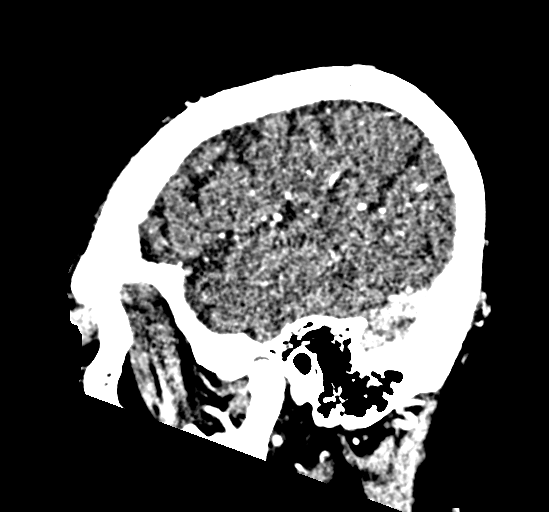
[im 67/166  brain]
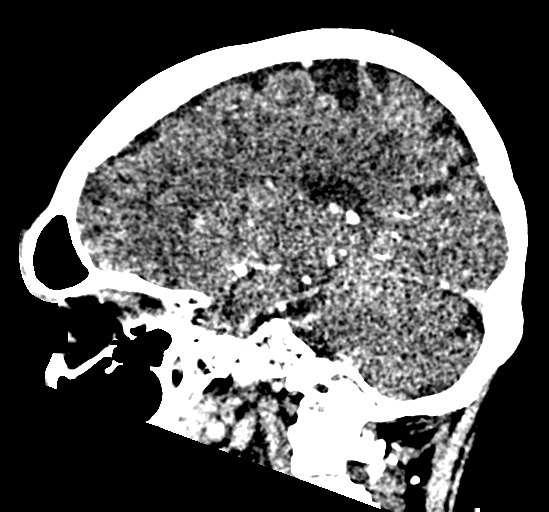
[im 100/166  brain]
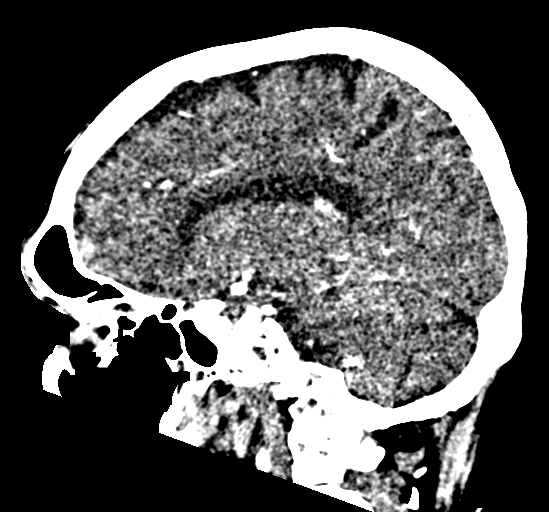

[19 of 47 positions shown; findings below may reference images not displayed]

FINDINGS: CTA HEAD

Anterior circulation: No significant stenosis, proximal occlusion,
aneurysm, or vascular malformation. Mild non stenotic calcific
atherosclerosis of the internal carotid arteries.

Posterior circulation: Mild irregularity of the diminutive right
vertebral artery at the PICA junction with segments of
mild-to-moderate stenosis. Left P2 tandem segments of moderate
stenosis and right P2 segment of mild stenosis. Otherwise no
significant stenosis, proximal occlusion, aneurysm, or vascular
malformation.

Venous sinuses: As permitted by contrast timing, patent.

Anatomic variants: Fetal right PCA. Left dominant vertebrobasilar
system. Small anterior and left posterior communicating arteries.
IMPRESSION: 1. No large vessel occlusion, aneurysm, or vascular malformation
identified.
2. Intracranial atherosclerosis with areas of mild-to-moderate
stenosis in the posterior circulations. No significant stenosis in
the anterior circulation.

## 2018-10-21 MED ORDER — GADOBUTROL 1 MMOL/ML IV SOLN
9.0000 mL | Freq: Once | INTRAVENOUS | Status: AC | PRN
  Administered 2018-10-21: 9 mL via INTRAVENOUS

## 2018-10-21 MED ORDER — IOHEXOL 350 MG/ML SOLN
75.0000 mL | Freq: Once | INTRAVENOUS | Status: AC | PRN
  Administered 2018-10-21: 75 mL via INTRAVENOUS

## 2018-10-21 MED ORDER — LORAZEPAM 1 MG PO TABS
1.0000 mg | ORAL_TABLET | Freq: Once | ORAL | Status: AC
  Administered 2018-10-21: 14:00:00 1 mg via ORAL
  Filled 2018-10-21: qty 1

## 2018-10-21 NOTE — Progress Notes (Signed)
Per Dr Doy Mince there is no need for stroke order set on this patient

## 2018-10-21 NOTE — ED Provider Notes (Signed)
_________________________ 12:01 AM on 10/21/2018 -----------------------------------------  Dr. Eddie Dibbles Macintosh from [facility] neurology called back to let me know that he spoke with his attending and they recommended keeping patient here and repeating CT angiogram of the head and getting a neurology evaluation locally.  The CT angiogram has been ordered.  Patient will be admitted to the hospitalist service.   Alfred Levins, [place], MD 10/21/18 0001

## 2018-10-21 NOTE — Progress Notes (Signed)
Reason for Consult:Diplopia Referring Physician: Stark Jock  CC: Diplopia  HPI: Cameron Foster. is an 78 y.o. male presenting in 6/24 with diplopia.  Noted to have a [place] and was transferred to [facility] at that time.  Work up was unremarkable.  No evidence of aneurysm.  Patient was discharged home and diplopia resolved.  Patient had sever headache about a week ago.  No recurrence of hemorrhage and no diplopia at that time.  Patient awakened on yesterday with diplopia.  Presented for evaluation.  [facility] contacted and did not feel that transfer was warranted since repeat head CT showed no hemorrhage.    Past Medical History:  Diagnosis Date  . Alcoholism ([place])   . Arthritis   . Atrial fibrillation ([place])    one episode  . Colon polyp   . Depression    Son died 2014-06-29  . Diverticulitis   . Diverticulosis 30 years  . Dysrhythmia    At Fib 1995  . GERD (gastroesophageal reflux disease)   . Hyperlipidemia   . Hypertension   . Irritable bowel syndrome   . Prostate cancer ([place]) 2010-06-29   treated with radiation therapy. Prostate  . Vasovagal syncope     Past Surgical History:  Procedure Laterality Date  . [place] no stents  . CATARACT EXTRACTION, BILATERAL    . COLON RESECTION SIGMOID N/A 12/07/2016   Procedure: COLON RESECTION SIGMOID;  Surgeon: Clayburn Pert, MD;  Location: [facility] ORS;  Service: General;  Laterality: N/A;  . COLON SURGERY  11/2016   Colostomy  . COLONOSCOPY  2014-06-29  . COLONOSCOPY WITH PROPOFOL N/A 05/30/2016   Procedure: COLONOSCOPY WITH PROPOFOL;  Surgeon: Lollie Sails, MD;  Location: [facility] ENDOSCOPY;  Service: Endoscopy;  Laterality: N/A;  . COLOSTOMY Left 12/07/2016   Procedure: COLOSTOMY;  Surgeon: Clayburn Pert, MD;  Location: [facility] ORS;  Service: General;  Laterality: Left;  . COLOSTOMY REVERSAL N/A 03/21/2017   Procedure: COLOSTOMY REVERSAL;  Surgeon: Clayburn Pert, MD;  Location: [facility] ORS;  Service: General;  Laterality: N/A;  .  COLOSTOMY TAKEDOWN N/A 03/21/2017   Procedure: LAPAROSCOPIC COLOSTOMY TAKEDOWN;  Surgeon: Clayburn Pert, MD;  Location: [facility] ORS;  Service: General;  Laterality: N/A;  . CYSTOSCOPY WITH STENT PLACEMENT Bilateral 03/21/2017   Procedure: CYSTOSCOPY WITH LIGHTED STENT PLACEMENT;  Surgeon: Abbie Sons, MD;  Location: [facility] ORS;  Service: Urology;  Laterality: Bilateral;  . ESOPHAGOGASTRODUODENOSCOPY    . ESOPHAGOGASTRODUODENOSCOPY N/A 05/30/2016   Procedure: ESOPHAGOGASTRODUODENOSCOPY (EGD);  Surgeon: Lollie Sails, MD;  Location: [facility] ENDOSCOPY;  Service: Endoscopy;  Laterality: N/A;  . EYE SURGERY     cataracts  . FLEXIBLE SIGMOIDOSCOPY N/A 03/21/2017   Procedure: FLEXIBLE SIGMOIDOSCOPY;  Surgeon: Clayburn Pert, MD;  Location: [facility] ORS;  Service: General;  Laterality: N/A;  . FRACTURE SURGERY Bilateral    right arm and left wrist  . INCISION AND DRAINAGE ABSCESS N/A 12/07/2016   Procedure: DRAINAGE  OF INTRA ABDOMINAL ABSCESS;  Surgeon: Clayburn Pert, MD;  Location: [facility] ORS;  Service: General;  Laterality: N/A;  . KNEE ARTHROSCOPY    . LAPAROTOMY N/A 12/07/2016   Procedure: EXPLORATORY LAPAROTOMY;  Surgeon: Clayburn Pert, MD;  Location: [facility] ORS;  Service: General;  Laterality: N/A;  . PROSTATE SURGERY     Microwave therapy  . TONSILLECTOMY    . TOTAL KNEE ARTHROPLASTY Right 07/27/2016   Procedure: TOTAL KNEE ARTHROPLASTY;  Surgeon: [name], MD;  Location: [facility] ORS;  Service: Orthopedics;  Laterality: Right;  Dr. Erlene Quan had to place Urinary catheter due to prostate cancer history.  Using flexible scope.    Family History  Problem Relation Age of Onset  . Lung cancer Father        smoker  . Other Mother   . Sudden death Son        due to "Blood clots"  . Bipolar disorder Son   . Kidney disease Daughter        congenital one small kidney  . Prostate cancer Neg Hx   . Bladder Cancer Neg Hx     Social History:  reports that he quit smoking about 30 years ago. His  smoking use included cigarettes. He has a 27.00 pack-year smoking history. He has quit using smokeless tobacco.  His smokeless tobacco use included chew. He reports current alcohol use of about 3.0 standard drinks of alcohol per week. He reports that he does not use drugs.  Allergies  Allergen Reactions  . Augmentin [Amoxicillin-Pot Clavulanate] Other (See Comments)    Abdominal upset Has patient had a PCN reaction causing immediate rash, facial/tongue/throat swelling, SOB or lightheadedness with hypotension: Unknown Has patient had a PCN reaction causing severe rash involving mucus membranes or skin necrosis: Unknown Has patient had a PCN reaction that required hospitalization: Unknown Has patient had a PCN reaction occurring within the last 10 years: Unknown If all of the above answers are "NO", then may proceed with Cephalosporin use.   . Formaldehyde Rash  . Tape Rash    Pt states he is not allergic to tape.    Medications:  I have reviewed the patient's current medications. Prior to Admission:  Medications Prior to Admission  Medication Sig Dispense Refill Last Dose  . amLODipine (NORVASC) 10 MG tablet Take 1 tablet (10 mg total) by mouth daily for 30 days. 30 tablet 0 Past Week at Unknown time  . docusate sodium (STOOL SOFTENER) 100 MG capsule Take 100 mg by mouth 2 (two) times daily.   Past Week at Unknown time  . doxycycline (VIBRA-TABS) 100 MG tablet Take 1 tablet (100 mg total) by mouth 2 (two) times daily. 20 tablet 0 Past Week at Unknown time  . fluticasone (FLONASE) 50 MCG/ACT nasal spray Place 1 spray into both nostrils daily as needed for allergies or rhinitis.   prn at prn  . furosemide (LASIX) 20 MG tablet Take 1 tablet (20 mg total) by mouth daily. 90 tablet 3 Past Week at Unknown time  . hydrocortisone 2.5 % cream APPLY CREAM TO AFFECTED AREA TWICE DAILY AS NEEDED FOR RASH 28 g 11 prn at prn  . lisinopril (PRINIVIL,ZESTRIL) 10 MG tablet TAKE 1 TABLET BY MOUTH EVERY DAY  90 tablet 1 Past Week at Unknown time  . liver oil-zinc oxide (DESITIN) 40 % ointment Apply 1 application topically daily as needed for irritation.   prn at prn  . meloxicam (MOBIC) 15 MG tablet Take 15 mg by mouth daily.    Past Week at Unknown time  . nebivolol (BYSTOLIC) 10 MG tablet Take 1 tablet (10 mg total) by mouth daily. 90 tablet 2 Past Week at Unknown time  . omeprazole (PRILOSEC) 20 MG capsule Take 20 mg by mouth daily as needed (heartburn).    prn at prn  . sertraline (ZOLOFT) 100 MG tablet Take 1 tablet (100 mg total) by mouth daily. 90 tablet 3 Past Week at Unknown time   Scheduled: . amLODipine  10 mg Oral Daily  . docusate sodium  100  mg Oral BID  . folic acid  1 mg Oral Daily  . furosemide  20 mg Oral Daily  . lisinopril  10 mg Oral Daily  . multivitamin with minerals  1 tablet Oral Daily  . nebivolol  10 mg Oral Daily  . pantoprazole  40 mg Oral Daily  . sertraline  100 mg Oral Daily  . thiamine  100 mg Oral Daily   Or  . thiamine  100 mg Intravenous Daily    ROS: History obtained from the patient  General ROS: negative for - chills, fatigue, fever, night sweats, weight gain or weight loss Psychological ROS: negative for - behavioral disorder, hallucinations, memory difficulties, mood swings or suicidal ideation Ophthalmic ROS: as noted in HPI ENT ROS: negative for - epistaxis, nasal discharge, oral lesions, sore throat, tinnitus or vertigo Allergy and Immunology ROS: negative for - hives or itchy/watery eyes Hematological and Lymphatic ROS: negative for - bleeding problems, bruising or swollen lymph nodes Endocrine ROS: negative for - galactorrhea, hair pattern changes, polydipsia/polyuria or temperature intolerance Respiratory ROS: negative for - cough, hemoptysis, shortness of breath or wheezing Cardiovascular ROS: negative for - chest pain, dyspnea on exertion, edema or irregular heartbeat Gastrointestinal ROS: negative for - abdominal pain, diarrhea,  hematemesis, nausea/vomiting or stool incontinence Genito-Urinary ROS: negative for - dysuria, hematuria, incontinence or urinary frequency/urgency Musculoskeletal ROS: negative for - joint swelling or muscular weakness Neurological ROS: as noted in HPI Dermatological ROS: negative for rash and skin lesion changes  Physical Examination: Blood pressure 135/77, pulse 68, temperature 98.4 F (36.9 C), temperature source Oral, resp. rate 16, height 5' 9"  (1.753 m), SpO2 97 %.  HEENT-  Normocephalic, no lesions, without obvious abnormality.  Normal external eye and conjunctiva.  Normal TM's bilaterally.  Normal auditory canals and external ears. Normal external nose, mucus membranes and septum.  Normal pharynx. Cardiovascular- S1, S2 normal, pulses palpable throughout   Lungs- chest clear, no wheezing, rales, normal symmetric air entry Abdomen- soft, non-tender; bowel sounds normal; no masses,  no organomegaly Extremities- right leg edema Lymph-no adenopathy palpable Musculoskeletal-no joint tenderness, deformity or swelling Skin-warm and dry, no hyperpigmentation, vitiligo, or suspicious lesions  Neurological Examination   Mental Status: Alert, oriented, thought content appropriate.  Speech fluent without evidence of aphasia.  Able to follow 3 step commands without difficulty. Cranial Nerves: II: Discs flat bilaterally; Visual fields grossly normal, pupils equal, round, reactive to light and accommodation III,IV, VI: ptosis not present, limited movement of the right eye laterally with EOM testing V,VII: smile symmetric, facial light touch sensation normal bilaterally VIII: hearing normal bilaterally IX,X: gag reflex present XI: bilateral shoulder shrug XII: midline tongue extension Motor: Right : Upper extremity   5/5    Left:     Upper extremity   5/5  Lower extremity   5/5     Lower extremity   5/5 Tone and bulk:normal tone throughout; no atrophy noted Sensory: Pinprick and light  touch intact throughout, bilaterally Deep Tendon Reflexes: Symmetric throughout Plantars: Right: mute   Left: mute Cerebellar: Normal finger-to-nose and normal heel-to-shin testing bilaterally Gait: not tested due to safety concerns  Laboratory Studies:   Basic Metabolic Panel: Recent Labs  Lab 10/20/18 2146 10/21/18 0459  NA 136 136  K 3.2* 3.5  CL 103 106  CO2 23 25  GLUCOSE 106* 94  BUN 12 9  CREATININE 1.25* 1.02  CALCIUM 8.8* 8.9    Liver Function Tests: No results for input(s): AST, ALT, ALKPHOS, BILITOT, PROT,  ALBUMIN in the last 168 hours. No results for input(s): LIPASE, AMYLASE in the last 168 hours. No results for input(s): AMMONIA in the last 168 hours.  CBC: Recent Labs  Lab 10/20/18 2144-07-07 10/21/18 0459  WBC 7.0 5.8  NEUTROABS 4.9  --   HGB 11.4* 11.0*  HCT 35.1* 34.6*  MCV 87.1 86.7  PLT 273 250    Cardiac Enzymes: No results for input(s): CKTOTAL, CKMB, CKMBINDEX, TROPONINI in the last 168 hours.  BNP: Invalid input(s): POCBNP  CBG: No results for input(s): GLUCAP in the last 168 hours.  Microbiology: Results for orders placed or performed during the hospital encounter of 10/20/18  MRSA PCR Screening     Status: None   Collection Time: 10/21/18  2:41 AM   Specimen: Nasopharyngeal  Result Value Ref Range Status   MRSA by PCR NEGATIVE NEGATIVE Final    Comment:        The GeneXpert MRSA Assay (FDA approved for NASAL specimens only), is one component of a comprehensive MRSA colonization surveillance program. It is not intended to diagnose MRSA infection nor to guide or monitor treatment for MRSA infections. Performed at [facility], [address] [place],  [address]     Coagulation Studies: Recent Labs    10/20/18 2144-07-07  LABPROT 13.1  INR 1.0    Urinalysis: No results for input(s): COLORURINE, LABSPEC, PHURINE, GLUCOSEU, HGBUR, BILIRUBINUR, KETONESUR, PROTEINUR, UROBILINOGEN, NITRITE, LEUKOCYTESUR in the  last 168 hours.  Invalid input(s): APPERANCEUR  Lipid Panel:     Component Value Date/Time   CHOL 213 (H) 05/18/2018 0826   TRIG 143 05/18/2018 0826   HDL 43 05/18/2018 0826   CHOLHDL 5.0 05/18/2018 0826   LDLCALC 141 (H) 05/18/2018 0826    HgbA1C:  Lab Results  Component Value Date   HGBA1C 5.2 03/15/2017    Urine Drug Screen:  No results found for: LABOPIA, COCAINSCRNUR, LABBENZ, AMPHETMU, THCU, LABBARB  Alcohol Level:  Recent Labs  Lab 10/20/18 07-07-2144  ETH <10    Other results: EKG: sinus rhythm at 67 bpm  Imaging: Ct Angio Head W Or Wo Contrast  Result Date: 10/21/2018 CLINICAL DATA:  78 y/o M; history of intracranial hemorrhage for follow-up. EXAM: CT ANGIOGRAPHY HEAD TECHNIQUE: Multidetector CT imaging of the head was performed using the standard protocol during bolus administration of intravenous contrast. Multiplanar CT image reconstructions and MIPs were obtained to evaluate the vascular anatomy. CONTRAST:  82m OMNIPAQUE IOHEXOL 350 MG/ML SOLN COMPARISON:  10/20/2018 CT head. FINDINGS: CTA HEAD Anterior circulation: No significant stenosis, proximal occlusion, aneurysm, or vascular malformation. Mild non stenotic calcific atherosclerosis of the internal carotid arteries. Posterior circulation: Mild irregularity of the diminutive right vertebral artery at the PICA junction with segments of mild-to-moderate stenosis. Left P2 tandem segments of moderate stenosis and right P2 segment of mild stenosis. Otherwise no significant stenosis, proximal occlusion, aneurysm, or vascular malformation. Venous sinuses: As permitted by contrast timing, patent. Anatomic variants: Fetal right PCA. Left dominant vertebrobasilar system. Small anterior and left posterior communicating arteries. IMPRESSION: 1. No large vessel occlusion, aneurysm, or vascular malformation identified. 2. Intracranial atherosclerosis with areas of mild-to-moderate stenosis in the posterior circulations. No significant  stenosis in the anterior circulation. Electronically Signed   By: LKristine GarbeM.D.   On: 10/21/2018 01:16   Ct Head Wo Contrast  Result Date: 10/20/2018 CLINICAL DATA:  Neuro deficit(s), subacute. Double vision. Patient reports recent stroke. EXAM: CT HEAD WITHOUT CONTRAST TECHNIQUE: Contiguous axial images were obtained from the  base of the skull through the vertex without intravenous contrast. COMPARISON:  Head CT 10/14/2018 trauma 10/10/2018 FINDINGS: Brain: Previous extra-axial hemorrhage along the pons and medulla has resolved without definite residual. No new or acute hemorrhage. No evidence of acute ischemia. Unchanged remote bilateral cerebellar infarcts in the cone or infarct in the right thalamus. Unchanged degree of atrophy and chronic small vessel ischemia. No midline shift or mass effect. No hydrocephalus. Vascular: Atherosclerotic vascular calcifications without hyperdense vessel. Skull: No fracture or focal lesion. Sinuses/Orbits: Paranasal sinuses and mastoid air cells are clear. The visualized orbits are unremarkable. Other: None. IMPRESSION: 1. No acute intracranial abnormality. 2. Resolved right pontinomedullary cistern extra-axial hemorrhage from prior exam. 3. Unchanged atrophy, chronic small vessel ischemia, and remote lacunar infarcts. Electronically Signed   By: Keith Rake M.D.   On: 10/20/2018 19:39     Assessment/Plan: 78 year old male with recurrent diplopia.  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  Head CT shows no evidence of recurrence of SAH.  CTA shows no evidence of aneurysm.  Etiology unclear.  Further work up recommended.  TSH elevated.  Recommendations: 1. Would further investigate possible thyroid abnormalities.  Also B1, ESR, myasthenia panel, A1c 2. MRI of the brain with and without contrast 3. EEG  Alexis Goodell, MD Neurology [phone] 10/21/2018, 11:22 AM

## 2018-10-21 NOTE — ED Notes (Signed)
ED TO INPATIENT HANDOFF REPORT  ED Nurse Name and Phone #: Tana Conch [phone]   S Name/Age/Gender Cameron Foster. 78 y.o. male Room/Bed: ED10A/ED10A  Code Status   Code Status: Full Code  Home/SNF/Other Home Patient oriented to: self, place, time and situation Is this baseline? Yes   Triage Complete: Triage complete  Chief Complaint Double vision  Triage Note Pt to ED via POV stating that he was seen last week at [facility] for Stroke, pt states that this morning when he woke up around 0700 he started having double vision again. Pt states that it is not as bad as when he had his stroke but it has not went away. Pt has no slurred speech, pt denies numbness or weakness. Pt is in NAD.    Allergies Allergies  Allergen Reactions  . Augmentin [Amoxicillin-Pot Clavulanate] Other (See Comments)    Abdominal upset Has patient had a PCN reaction causing immediate rash, facial/tongue/throat swelling, SOB or lightheadedness with hypotension: Unknown Has patient had a PCN reaction causing severe rash involving mucus membranes or skin necrosis: Unknown Has patient had a PCN reaction that required hospitalization: Unknown Has patient had a PCN reaction occurring within the last 10 years: Unknown If all of the above answers are "NO", then may proceed with Cephalosporin use.   . Formaldehyde Rash  . Tape Rash    Pt states he is not allergic to tape.    Level of Care/Admitting Diagnosis ED Disposition    ED Disposition Condition [facility] Area: [place] [100120]  Level of Care: Med-Surg [16]  Covid Evaluation: Asymptomatic Screening Protocol (No Symptoms)  Diagnosis: TIA (transient ischemic attack) [989211]  Admitting Physician: Mayer Camel [[id number]]  Attending Physician: Mayer Camel [[id number]]  Estimated length of stay: past midnight tomorrow  Certification:: I certify this patient will need inpatient services for at least 2 midnights  PT  Class (Do Not Modify): Inpatient [101]  PT Acc Code (Do Not Modify): Private [1]       B Medical/Surgery History Past Medical History:  Diagnosis Date  . Alcoholism ([place])   . Arthritis   . Atrial fibrillation ([place])    one episode  . Colon polyp   . Depression    Son died 06-13-14  . Diverticulitis   . Diverticulosis 30 years  . Dysrhythmia    At Fib 1995  . GERD (gastroesophageal reflux disease)   . Hyperlipidemia   . Hypertension   . Irritable bowel syndrome   . Prostate cancer ([place]) 06/13/10   treated with radiation therapy. Prostate  . Vasovagal syncope    Past Surgical History:  Procedure Laterality Date  . [place] no stents  . CATARACT EXTRACTION, BILATERAL    . COLON RESECTION SIGMOID N/A 12/07/2016   Procedure: COLON RESECTION SIGMOID;  Surgeon: Clayburn Pert, MD;  Location: [facility] ORS;  Service: General;  Laterality: N/A;  . COLON SURGERY  11/2016   Colostomy  . COLONOSCOPY  06-13-2014  . COLONOSCOPY WITH PROPOFOL N/A 05/30/2016   Procedure: COLONOSCOPY WITH PROPOFOL;  Surgeon: Lollie Sails, MD;  Location: [facility] ENDOSCOPY;  Service: Endoscopy;  Laterality: N/A;  . COLOSTOMY Left 12/07/2016   Procedure: COLOSTOMY;  Surgeon: Clayburn Pert, MD;  Location: [facility] ORS;  Service: General;  Laterality: Left;  . COLOSTOMY REVERSAL N/A 03/21/2017   Procedure: COLOSTOMY REVERSAL;  Surgeon: Clayburn Pert, MD;  Location: [facility] ORS;  Service: General;  Laterality: N/A;  .  COLOSTOMY TAKEDOWN N/A 03/21/2017   Procedure: LAPAROSCOPIC COLOSTOMY TAKEDOWN;  Surgeon: Clayburn Pert, MD;  Location: [facility] ORS;  Service: General;  Laterality: N/A;  . CYSTOSCOPY WITH STENT PLACEMENT Bilateral 03/21/2017   Procedure: CYSTOSCOPY WITH LIGHTED STENT PLACEMENT;  Surgeon: Abbie Sons, MD;  Location: [facility] ORS;  Service: Urology;  Laterality: Bilateral;  . ESOPHAGOGASTRODUODENOSCOPY    . ESOPHAGOGASTRODUODENOSCOPY N/A 05/30/2016   Procedure:  ESOPHAGOGASTRODUODENOSCOPY (EGD);  Surgeon: Lollie Sails, MD;  Location: [facility] ENDOSCOPY;  Service: Endoscopy;  Laterality: N/A;  . EYE SURGERY     cataracts  . FLEXIBLE SIGMOIDOSCOPY N/A 03/21/2017   Procedure: FLEXIBLE SIGMOIDOSCOPY;  Surgeon: Clayburn Pert, MD;  Location: [facility] ORS;  Service: General;  Laterality: N/A;  . FRACTURE SURGERY Bilateral    right arm and left wrist  . INCISION AND DRAINAGE ABSCESS N/A 12/07/2016   Procedure: DRAINAGE  OF INTRA ABDOMINAL ABSCESS;  Surgeon: Clayburn Pert, MD;  Location: [facility] ORS;  Service: General;  Laterality: N/A;  . KNEE ARTHROSCOPY    . LAPAROTOMY N/A 12/07/2016   Procedure: EXPLORATORY LAPAROTOMY;  Surgeon: Clayburn Pert, MD;  Location: [facility] ORS;  Service: General;  Laterality: N/A;  . PROSTATE SURGERY     Microwave therapy  . TONSILLECTOMY    . TOTAL KNEE ARTHROPLASTY Right 07/27/2016   Procedure: TOTAL KNEE ARTHROPLASTY;  Surgeon: [name], MD;  Location: [facility] ORS;  Service: Orthopedics;  Laterality: Right;  Dr. Erlene Quan had to place Urinary catheter due to prostate cancer history.  Using flexible scope.     A IV Location/Drains/Wounds Patient Lines/Drains/Airways Status   Active Line/Drains/Airways    Name:   Placement date:   Placement time:   Site:   Days:   Peripheral IV 10/14/18 Left Antecubital   10/14/18    1649    Antecubital   7   Peripheral IV 10/20/18 Left Antecubital   10/20/18    2145    Antecubital   1   Colostomy LLQ   12/07/16    1800    LLQ   683   Ureteral Drain/Stent Left ureter 6 Fr.   03/21/17    0810    Left ureter   579   Incision (Closed) 12/07/16 Abdomen Mid   12/07/16    1745     683   Incision (Closed) 03/21/17 Perineum Other (Comment)   03/21/17    0913     579   Incision (Closed) 03/21/17 Rectum Other (Comment)   03/21/17    0913     579   Incision (Closed) 03/21/17 Abdomen Right   03/21/17    1332     579   Incision (Closed) 03/21/17 Abdomen Left   03/21/17    1332     579   Incision - 3  Ports Abdomen Right;Upper Right;Mid Right;Lower   03/21/17    0855     579          Intake/Output Last 24 hours No intake or output data in the 24 hours ending 10/21/18 0015  Labs/Imaging Results for orders placed or performed during the hospital encounter of 10/20/18 (from the past 48 hour(s))  CBC with Differential     Status: Abnormal   Collection Time: 10/20/18  9:46 PM  Result Value Ref Range   WBC 7.0 4.0 - 10.5 K/uL   RBC 4.03 (L) 4.22 - 5.81 MIL/uL   Hemoglobin 11.4 (L) 13.0 - 17.0 g/dL   HCT 35.1 (L) 39.0 - 52.0 %   MCV  87.1 80.0 - 100.0 fL   MCH 28.3 26.0 - 34.0 pg   MCHC 32.5 30.0 - 36.0 g/dL   RDW 13.9 11.5 - 15.5 %   Platelets 273 150 - 400 K/uL   nRBC 0.0 0.0 - 0.2 %   Neutrophils Relative % 71 %   Neutro Abs 4.9 1.7 - 7.7 K/uL   Lymphocytes Relative 17 %   Lymphs Abs 1.2 0.7 - 4.0 K/uL   Monocytes Relative 8 %   Monocytes Absolute 0.6 0.1 - 1.0 K/uL   Eosinophils Relative 3 %   Eosinophils Absolute 0.2 0.0 - 0.5 K/uL   Basophils Relative 1 %   Basophils Absolute 0.1 0.0 - 0.1 K/uL   Immature Granulocytes 0 %   Abs Immature Granulocytes 0.03 0.00 - 0.07 K/uL    Comment: Performed at [facility], [address] [place], [place] [address]  Basic metabolic panel     Status: Abnormal   Collection Time: 10/20/18  9:46 PM  Result Value Ref Range   Sodium 136 135 - 145 mmol/L   Potassium 3.2 (L) 3.5 - 5.1 mmol/L   Chloride 103 98 - 111 mmol/L   CO2 23 22 - 32 mmol/L   Glucose, Bld 106 (H) 70 - 99 mg/dL   BUN 12 8 - 23 mg/dL   Creatinine, Ser 1.25 (H) 0.61 - 1.24 mg/dL   Calcium 8.8 (L) 8.9 - 10.3 mg/dL   GFR calc non Af Amer 55 (L) >60 mL/min   GFR calc Af Amer >60 >60 mL/min   Anion gap 10 5 - 15    Comment: Performed at [facility], [address] [place], [place] [address]  Ethanol     Status: None   Collection Time: 10/20/18  9:46 PM  Result Value Ref Range   Alcohol, Ethyl (B) <10 <10 mg/dL    Comment: (NOTE) Lowest  detectable limit for serum alcohol is 10 mg/dL. For medical purposes only. Performed at [facility], [address] [place], [place] [address]   Protime-INR     Status: None   Collection Time: 10/20/18  9:46 PM  Result Value Ref Range   Prothrombin Time 13.1 11.4 - 15.2 seconds   INR 1.0 0.8 - 1.2    Comment: (NOTE) INR goal varies based on device and disease states. Performed at [facility], [place],  [address]    Ct Head Wo Contrast  Result Date: 10/20/2018 CLINICAL DATA:  Neuro deficit(s), subacute. Double vision. Patient reports recent stroke. EXAM: CT HEAD WITHOUT CONTRAST TECHNIQUE: Contiguous axial images were obtained from the base of the skull through the vertex without intravenous contrast. COMPARISON:  Head CT 10/14/2018 trauma 10/10/2018 FINDINGS: Brain: Previous extra-axial hemorrhage along the pons and medulla has resolved without definite residual. No new or acute hemorrhage. No evidence of acute ischemia. Unchanged remote bilateral cerebellar infarcts in the cone or infarct in the right thalamus. Unchanged degree of atrophy and chronic small vessel ischemia. No midline shift or mass effect. No hydrocephalus. Vascular: Atherosclerotic vascular calcifications without hyperdense vessel. Skull: No fracture or focal lesion. Sinuses/Orbits: Paranasal sinuses and mastoid air cells are clear. The visualized orbits are unremarkable. Other: None. IMPRESSION: 1. No acute intracranial abnormality. 2. Resolved right pontinomedullary cistern extra-axial hemorrhage from prior exam. 3. Unchanged atrophy, chronic small vessel ischemia, and remote lacunar infarcts. Electronically Signed   By: Keith Rake M.D.   On: 10/20/2018 19:39    Pending Labs [employer] (  From admission, onward)    Start     Ordered   10/21/18 8811  Basic metabolic panel  Tomorrow morning,   STAT     10/20/18 2345   10/21/18 0500  CBC  Tomorrow morning,   STAT      10/20/18 2345   10/20/18 2346  TSH  Once,   STAT     10/20/18 2345          Vitals/Pain Today's Vitals   10/20/18 2200 10/20/18 2230 10/20/18 2300 10/20/18 2330  BP: (!) 142/90 (!) 155/89 (!) 146/87 (!) 149/98  Pulse: 67 73 66 66  Resp: 12 17 20 14   Temp:      TempSrc:      SpO2: 99% 100% 98% 95%  PainSc:        Isolation Precautions No active isolations  Medications Medications  amLODipine (NORVASC) tablet 10 mg (has no administration in time range)  furosemide (LASIX) tablet 20 mg (has no administration in time range)  lisinopril (ZESTRIL) tablet 10 mg (has no administration in time range)  nebivolol (BYSTOLIC) tablet 10 mg (has no administration in time range)  sertraline (ZOLOFT) tablet 100 mg (has no administration in time range)  docusate sodium (COLACE) capsule 100 mg (has no administration in time range)  pantoprazole (PROTONIX) EC tablet 40 mg (has no administration in time range)  acetaminophen (TYLENOL) tablet 650 mg (has no administration in time range)    Or  acetaminophen (TYLENOL) suppository 650 mg (has no administration in time range)  polyethylene glycol (MIRALAX / GLYCOLAX) packet 17 g (has no administration in time range)  ondansetron (ZOFRAN) tablet 4 mg (has no administration in time range)    Or  ondansetron (ZOFRAN) injection 4 mg (has no administration in time range)  potassium chloride (KLOR-CON) packet 20 mEq (has no administration in time range)  LORazepam (ATIVAN) tablet 1 mg (has no administration in time range)    Or  LORazepam (ATIVAN) injection 1 mg (has no administration in time range)  thiamine (VITAMIN B-1) tablet 100 mg (has no administration in time range)    Or  thiamine (B-1) injection 100 mg (has no administration in time range)  folic acid (FOLVITE) tablet 1 mg (has no administration in time range)  multivitamin with minerals tablet 1 tablet (has no administration in time range)    Mobility walks with person assist Low fall  risk   Focused Assessments Neuro Assessment Handoff:  Swallow screen pass? N/A         Neuro Assessment: Within Defined Limits Neuro Checks:      Last Documented NIHSS Modified Score:   Has TPA been given? No If patient is a Neuro Trauma and patient is going to OR before floor call report to [facility] nurse: [phone] or [phone]     R Recommendations: See Admitting Provider Note  Report given to:   Additional Notes:

## 2018-10-21 NOTE — ED Notes (Signed)
Patient taken to imaging. 

## 2018-10-21 NOTE — H&P (Signed)
[place] at [place] NAME: Cameron Foster    MR#:  [id number]  DATE OF BIRTH:  16-Sep-1940  DATE OF ADMISSION:  10/20/2018  PRIMARY CARE PHYSICIAN: [name], MD   REQUESTING/REFERRING PHYSICIAN: Charlotte Crumb, MD  CHIEF COMPLAINT:   Chief Complaint  Patient presents with   Diplopia    HISTORY OF PRESENT ILLNESS:  Cameron Foster  is a 78 y.o. male with a known history of subarachnoid hemorrhage on 10/10/2018 which resulted in patient's transfer to [facility] for further evaluation.  Patient had initially presented at that time to our emergency room with diplopia when looking to the right with a slight unsteady gait.  Patient tells me symptoms resolved completely after being discharged from [facility].  However, he woke 6 days ago on 06/25/22 morning with severe frontal and maxillofacial pain.  He was seen in the emergency room and treated with analgesic with resolution of the pain at that time.  Patient tells me he was experiencing no neurologic deficits at that time.  However, this a.m. when patient woke he noted to again be having diplopia when looking to the right.  He has noted no headache or facial pain.  He denies blurred vision.  He denies slurred speech or facial drooping.  No unilateral weakness.  No gait disturbance.  He denies shortness of breath or chest pain.  He denies palpitations.  CT brain was completed demonstrating no acute intracranial abnormality.  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  Labs demonstrate potassium 3.2.  He has been admitted to the hospitalist service for further management.  PAST MEDICAL HISTORY:   Past Medical History:  Diagnosis Date   Alcoholism [facility])    Arthritis    Atrial fibrillation ([place])    one episode   Colon polyp    Depression    Son died 2014/06/25   Diverticulitis    Diverticulosis 30 years   Dysrhythmia    At Fib 1993/06/25   GERD (gastroesophageal reflux  disease)    Hyperlipidemia    Hypertension    Irritable bowel syndrome    Prostate cancer ([place]) Jun 25, 2010   treated with radiation therapy. Prostate   Vasovagal syncope     PAST SURGICAL HISTORY:   Past Surgical History:  Procedure Laterality Date   CARDIAC CATHETERIZATION  06/25/1996   [place] no stents   CATARACT EXTRACTION, BILATERAL     COLON RESECTION SIGMOID N/A 12/07/2016   Procedure: COLON RESECTION SIGMOID;  Surgeon: Clayburn Pert, MD;  Location: [facility] ORS;  Service: General;  Laterality: N/A;   COLON SURGERY  11/2016   Colostomy   COLONOSCOPY  2014-06-25   COLONOSCOPY WITH PROPOFOL N/A 05/30/2016   Procedure: COLONOSCOPY WITH PROPOFOL;  Surgeon: Lollie Sails, MD;  Location: [facility] ENDOSCOPY;  Service: Endoscopy;  Laterality: N/A;   COLOSTOMY Left 12/07/2016   Procedure: COLOSTOMY;  Surgeon: Clayburn Pert, MD;  Location: [facility] ORS;  Service: General;  Laterality: Left;   COLOSTOMY REVERSAL N/A 03/21/2017   Procedure: COLOSTOMY REVERSAL;  Surgeon: Clayburn Pert, MD;  Location: [facility] ORS;  Service: General;  Laterality: N/A;   COLOSTOMY TAKEDOWN N/A 03/21/2017   Procedure: LAPAROSCOPIC COLOSTOMY TAKEDOWN;  Surgeon: Clayburn Pert, MD;  Location: [facility] ORS;  Service: General;  Laterality: N/A;   CYSTOSCOPY WITH STENT PLACEMENT Bilateral 03/21/2017   Procedure: CYSTOSCOPY WITH LIGHTED STENT PLACEMENT;  Surgeon: Abbie Sons, MD;  Location: [facility] ORS;  Service: Urology;  Laterality: Bilateral;   ESOPHAGOGASTRODUODENOSCOPY  ESOPHAGOGASTRODUODENOSCOPY N/A 05/30/2016   Procedure: ESOPHAGOGASTRODUODENOSCOPY (EGD);  Surgeon: Lollie Sails, MD;  Location: [facility] ENDOSCOPY;  Service: Endoscopy;  Laterality: N/A;   EYE SURGERY     cataracts   FLEXIBLE SIGMOIDOSCOPY N/A 03/21/2017   Procedure: FLEXIBLE SIGMOIDOSCOPY;  Surgeon: Clayburn Pert, MD;  Location: [facility] ORS;  Service: General;  Laterality: N/A;   FRACTURE SURGERY Bilateral    right arm and left wrist     INCISION AND DRAINAGE ABSCESS N/A 12/07/2016   Procedure: DRAINAGE  OF INTRA ABDOMINAL ABSCESS;  Surgeon: Clayburn Pert, MD;  Location: [facility] ORS;  Service: General;  Laterality: N/A;   KNEE ARTHROSCOPY     LAPAROTOMY N/A 12/07/2016   Procedure: EXPLORATORY LAPAROTOMY;  Surgeon: Clayburn Pert, MD;  Location: [facility] ORS;  Service: General;  Laterality: N/A;   PROSTATE SURGERY     Microwave therapy   TONSILLECTOMY     TOTAL KNEE ARTHROPLASTY Right 07/27/2016   Procedure: TOTAL KNEE ARTHROPLASTY;  Surgeon: [name], MD;  Location: [facility] ORS;  Service: Orthopedics;  Laterality: Right;  Dr. Erlene Quan had to place Urinary catheter due to prostate cancer history.  Using flexible scope.    SOCIAL HISTORY:   Social History   Tobacco Use   Smoking status: Former Smoker    Packs/day: 1.00    Years: 27.00    Pack years: 27.00    Types: Cigarettes    Quit date: 04/18/1988    Years since quitting: 30.5   Smokeless tobacco: Former Systems developer    Types: Chew  Substance Use Topics   Alcohol use: Yes    Alcohol/week: 3.0 standard drinks    Types: 3 Standard drinks or equivalent per week    Comment: Occasional- Former Heavy ETOH Use    FAMILY HISTORY:   Family History  Problem Relation Age of Onset   Lung cancer Father        smoker   Other Mother    Sudden death Son        due to "Blood clots"   Bipolar disorder Son    Kidney disease Daughter        congenital one small kidney   Prostate cancer Neg Hx    Bladder Cancer Neg Hx     DRUG ALLERGIES:   Allergies  Allergen Reactions   Augmentin [Amoxicillin-Pot Clavulanate] Other (See Comments)    Abdominal upset Has patient had a PCN reaction causing immediate rash, facial/tongue/throat swelling, SOB or lightheadedness with hypotension: Unknown Has patient had a PCN reaction causing severe rash involving mucus membranes or skin necrosis: Unknown Has patient had a PCN reaction that required hospitalization: Unknown Has  patient had a PCN reaction occurring within the last 10 years: Unknown If all of the above answers are "NO", then may proceed with Cephalosporin use.    Formaldehyde Rash   Tape Rash    Pt states he is not allergic to tape.    REVIEW OF SYSTEMS:   Review of Systems  Constitutional: Negative for chills, fever and malaise/fatigue.  HENT: Negative for congestion, ear pain, nosebleeds, sinus pain, sore throat and tinnitus.   Eyes: Positive for double vision (when looking to the right). Negative for blurred vision, photophobia, pain, discharge and redness.  Respiratory: Negative for cough, shortness of breath and wheezing.   Cardiovascular: Negative for chest pain and palpitations.  Gastrointestinal: Negative for abdominal pain, constipation, diarrhea, heartburn, nausea and vomiting.  Genitourinary: Negative for dysuria, flank pain and hematuria.  Musculoskeletal: Negative for falls, joint pain and myalgias.  Skin: Negative.  Negative for itching and rash.  Neurological: Positive for headaches. Negative for dizziness, tingling, tremors, sensory change, speech change, focal weakness, seizures, loss of consciousness and weakness.  Psychiatric/Behavioral: Negative.  Negative for depression.      MEDICATIONS AT HOME:   Prior to Admission medications   Medication Sig Start Date End Date Taking? Authorizing Provider  amLODipine (NORVASC) 10 MG tablet Take 1 tablet (10 mg total) by mouth daily for 30 days. 10/14/18 11/13/18 Yes Arta Silence, MD  docusate sodium (STOOL SOFTENER) 100 MG capsule Take 100 mg by mouth 2 (two) times daily.   Yes [provider]  doxycycline (VIBRA-TABS) 100 MG tablet Take 1 tablet (100 mg total) by mouth 2 (two) times daily. 10/16/18  Yes Bacigalupo, Dionne Bucy, MD  fluticasone (FLONASE) 50 MCG/ACT nasal spray Place 1 spray into both nostrils daily as needed for allergies or rhinitis.   Yes [provider]  furosemide (LASIX) 20 MG tablet Take 1  tablet (20 mg total) by mouth daily. 05/02/18 10/20/18 Yes End, Harrell Gave, MD  hydrocortisone 2.5 % cream APPLY CREAM TO AFFECTED AREA TWICE DAILY AS NEEDED FOR RASH 05/28/18  Yes Bacigalupo, Dionne Bucy, MD  lisinopril (PRINIVIL,ZESTRIL) 10 MG tablet TAKE 1 TABLET BY MOUTH EVERY DAY 07/06/18  Yes Bacigalupo, Dionne Bucy, MD  liver oil-zinc oxide (DESITIN) 40 % ointment Apply 1 application topically daily as needed for irritation.   Yes [provider]  meloxicam (MOBIC) 15 MG tablet Take 15 mg by mouth daily.  04/19/18  Yes [provider]  nebivolol (BYSTOLIC) 10 MG tablet Take 1 tablet (10 mg total) by mouth daily. 05/02/18  Yes End, Harrell Gave, MD  omeprazole (PRILOSEC) 20 MG capsule Take 20 mg by mouth daily as needed (heartburn).    Yes [provider]  sertraline (ZOLOFT) 100 MG tablet Take 1 tablet (100 mg total) by mouth daily. 05/30/18  Yes Bacigalupo, Dionne Bucy, MD      VITAL SIGNS:  Blood pressure (!) 149/98, pulse 66, temperature 99.4 F (37.4 C), temperature source Oral, resp. rate 14, SpO2 95 %.  PHYSICAL EXAMINATION:  Physical Exam  GENERAL:  78 y.o.-year-old patient lying in the bed with no acute distress.  EYES: Pupils equal, round, reactive to light and accommodation. No scleral icterus. Nystagmus of right eye when looking to the right.  HEENT: Head atraumatic, normocephalic. Oropharynx and nasopharynx clear.  NECK:  Supple, no jugular venous distention. No thyroid enlargement, no tenderness.  LUNGS: Normal breath sounds bilaterally, no wheezing, rales,rhonchi or crepitation. No use of accessory muscles of respiration.  CARDIOVASCULAR: Regular rate and rhythm, S1, S2 normal. No murmurs, rubs, or gallops.  ABDOMEN: Soft, nondistended, nontender. Bowel sounds present. No organomegaly or mass.  EXTREMITIES:Bilateral lower extremity  pedal edema, cyanosis, or clubbing.  NEUROLOGIC: VI nerve impairment. Muscle strength 5/5 in all extremities. Sensation intact.  Gait not checked.  PSYCHIATRIC: The patient is alert and oriented x 3.  Normal affect and good eye contact. SKIN: No obvious rash, lesion, or ulcer.   LABORATORY PANEL:   CBC Recent Labs  Lab 10/20/18 2146  WBC 7.0  HGB 11.4*  HCT 35.1*  PLT 273   ------------------------------------------------------------------------------------------------------------------  Chemistries  Recent Labs  Lab 10/20/18 2146  NA 136  K 3.2*  CL 103  CO2 23  GLUCOSE 106*  BUN 12  CREATININE 1.25*  CALCIUM 8.8*   ------------------------------------------------------------------------------------------------------------------  Cardiac Enzymes No results for input(s): TROPONINI in the last 168 hours. ------------------------------------------------------------------------------------------------------------------  RADIOLOGY:  Ct Head Wo Contrast  Result Date: 10/20/2018 CLINICAL DATA:  Neuro deficit(s), subacute. Double vision. Patient reports recent stroke. EXAM: CT HEAD WITHOUT CONTRAST TECHNIQUE: Contiguous axial images were obtained from the base of the skull through the vertex without intravenous contrast. COMPARISON:  Head CT 10/14/2018 trauma 10/10/2018 FINDINGS: Brain: Previous extra-axial hemorrhage along the pons and medulla has resolved without definite residual. No new or acute hemorrhage. No evidence of acute ischemia. Unchanged remote bilateral cerebellar infarcts in the cone or infarct in the right thalamus. Unchanged degree of atrophy and chronic small vessel ischemia. No midline shift or mass effect. No hydrocephalus. Vascular: Atherosclerotic vascular calcifications without hyperdense vessel. Skull: No fracture or focal lesion. Sinuses/Orbits: Paranasal sinuses and mastoid air cells are clear. The visualized orbits are unremarkable. Other: None. IMPRESSION: 1. No acute intracranial abnormality. 2. Resolved right pontinomedullary cistern extra-axial hemorrhage from prior exam. 3.  Unchanged atrophy, chronic small vessel ischemia, and remote lacunar infarcts. Electronically Signed   By: Keith Rake M.D.   On: 10/20/2018 19:39      IMPRESSION AND PLAN:   1.  Diplopia - With recent subarachnoid hemorrhage resulting in transfer to [facility] for evaluation and ultimately patient's disposition home. - CT brain is negative for acute findings. - Neurology, Dr. Doy Mince, has been consulted for diplopia related to involvement of the 6th nerve. - Continue neurologic checks every 2 hours  2. status post subarachnoid hemorrhage - Aware - Neurology consulted for further evaluation of current new onset diplopia -Will repeat CBC and BMP in the a.m. -We will continue every 2 hour neurologic checks  3.  Hypokalemia - Patient received p.o. potassium replacement -Repeat BMP in the a.m. -Patient is on telemetry monitoring  4.  History of EtOH - CIWA protocol  SCDs for DVT prophylaxis and PPI prophylaxis initiated    All the records are reviewed and case discussed with ED provider. The plan of care was discussed in details with the patient (and family). I answered all questions. The patient agreed to proceed with the above mentioned plan. Further management will depend upon hospital course.   CODE STATUS: Full code  TOTAL TIME TAKING CARE OF THIS PATIENT: 45 minutes.    Robinette on 10/21/2018 at 12:17 AM  Pager - [phone]  After 6pm go to [email or web address] - Proofreader  [facility]  Office  [phone]  CC: Primary care physician; [name], MD   Note: This dictation was prepared with Dragon dictation along with smaller phrase technology. Any transcriptional errors that result from this process are unintentional.

## 2018-10-21 NOTE — Progress Notes (Signed)
[place] at [place] NAME: Cameron Foster    MR#:  [id number]  DATE OF BIRTH:  1940/09/12  SUBJECTIVE:  CHIEF COMPLAINT:   Chief Complaint  Patient presents with  . Diplopia   No new complaint this morning.  Admitted with diplopia last night.  Appears resolved this morning.  No headaches.  No chest pain.  No shortness of breath  REVIEW OF SYSTEMS:  Review of Systems  Constitutional: Negative for chills and fever.  HENT: Negative for hearing loss and tinnitus.   Eyes: Negative for pain.       Double vision resolved this morning  Respiratory: Negative for cough and shortness of breath.   Cardiovascular: Negative for chest pain and palpitations.  Gastrointestinal: Negative for heartburn and nausea.  Genitourinary: Negative for dysuria and urgency.  Musculoskeletal: Negative for myalgias and neck pain.  Skin: Negative for itching and rash.  Neurological: Negative for dizziness and headaches.  Psychiatric/Behavioral: Negative for depression and hallucinations.    DRUG ALLERGIES:   Allergies  Allergen Reactions  . Augmentin [Amoxicillin-Pot Clavulanate] Other (See Comments)    Abdominal upset Has patient had a PCN reaction causing immediate rash, facial/tongue/throat swelling, SOB or lightheadedness with hypotension: Unknown Has patient had a PCN reaction causing severe rash involving mucus membranes or skin necrosis: Unknown Has patient had a PCN reaction that required hospitalization: Unknown Has patient had a PCN reaction occurring within the last 10 years: Unknown If all of the above answers are "NO", then may proceed with Cephalosporin use.   . Formaldehyde Rash  . Tape Rash    Pt states he is not allergic to tape.   VITALS:  Blood pressure 135/77, pulse 68, temperature 98.4 F (36.9 C), temperature source Oral, resp. rate 16, height 5' 9"  (1.753 m), SpO2 97 %. PHYSICAL EXAMINATION:  Physical Exam   GENERAL:  78  y.o.-year-old patient lying in the bed with no acute distress.  EYES: Pupils equal, round, reactive to light and accommodation. No scleral icterus.  No nystagmus this morning  HEENT: Head atraumatic, normocephalic. Oropharynx and nasopharynx clear.  NECK:  Supple, no jugular venous distention. No thyroid enlargement, no tenderness.  LUNGS: Normal breath sounds bilaterally, no wheezing, rales,rhonchi or crepitation. No use of accessory muscles of respiration.  CARDIOVASCULAR: Regular rate and rhythm, S1, S2 normal. No murmurs, rubs, or gallops.  ABDOMEN: Soft, nondistended, nontender. Bowel sounds present. No organomegaly or mass.  EXTREMITIES: 1+ edema bilaterally.  No cyanosis, or clubbing.  NEUROLOGIC:  Cranial nerves intact this morning.  Muscle strength 5/5 in all extremities. Sensation intact. Gait not checked.  PSYCHIATRIC: The patient is alert and oriented x 3.  Normal affect and good eye contact. SKIN: No obvious rash, lesion, or ulcer.   LABORATORY PANEL:  Male CBC Recent Labs  Lab 10/21/18 0459  WBC 5.8  HGB 11.0*  HCT 34.6*  PLT 250   ------------------------------------------------------------------------------------------------------------------ Chemistries  Recent Labs  Lab 10/21/18 0459  NA 136  K 3.5  CL 106  CO2 25  GLUCOSE 94  BUN 9  CREATININE 1.02  CALCIUM 8.9   RADIOLOGY:  Ct Angio Head W Or Wo Contrast  Result Date: 10/21/2018 CLINICAL DATA:  78 y/o M; history of intracranial hemorrhage for follow-up. EXAM: CT ANGIOGRAPHY HEAD TECHNIQUE: Multidetector CT imaging of the head was performed using the standard protocol during bolus administration of intravenous contrast. Multiplanar CT image reconstructions and MIPs were obtained to evaluate the vascular anatomy. CONTRAST:  54m OMNIPAQUE IOHEXOL 350 MG/ML SOLN COMPARISON:  10/20/2018 CT head. FINDINGS: CTA HEAD Anterior circulation: No significant stenosis, proximal occlusion, aneurysm, or vascular  malformation. Mild non stenotic calcific atherosclerosis of the internal carotid arteries. Posterior circulation: Mild irregularity of the diminutive right vertebral artery at the PICA junction with segments of mild-to-moderate stenosis. Left P2 tandem segments of moderate stenosis and right P2 segment of mild stenosis. Otherwise no significant stenosis, proximal occlusion, aneurysm, or vascular malformation. Venous sinuses: As permitted by contrast timing, patent. Anatomic variants: Fetal right PCA. Left dominant vertebrobasilar system. Small anterior and left posterior communicating arteries. IMPRESSION: 1. No large vessel occlusion, aneurysm, or vascular malformation identified. 2. Intracranial atherosclerosis with areas of mild-to-moderate stenosis in the posterior circulations. No significant stenosis in the anterior circulation. Electronically Signed   By: LKristine GarbeM.D.   On: 10/21/2018 01:16   Ct Head Wo Contrast  Result Date: 10/20/2018 CLINICAL DATA:  Neuro deficit(s), subacute. Double vision. Patient reports recent stroke. EXAM: CT HEAD WITHOUT CONTRAST TECHNIQUE: Contiguous axial images were obtained from the base of the skull through the vertex without intravenous contrast. COMPARISON:  Head CT 10/14/2018 trauma 10/10/2018 FINDINGS: Brain: Previous extra-axial hemorrhage along the pons and medulla has resolved without definite residual. No new or acute hemorrhage. No evidence of acute ischemia. Unchanged remote bilateral cerebellar infarcts in the cone or infarct in the right thalamus. Unchanged degree of atrophy and chronic small vessel ischemia. No midline shift or mass effect. No hydrocephalus. Vascular: Atherosclerotic vascular calcifications without hyperdense vessel. Skull: No fracture or focal lesion. Sinuses/Orbits: Paranasal sinuses and mastoid air cells are clear. The visualized orbits are unremarkable. Other: None. IMPRESSION: 1. No acute intracranial abnormality. 2.  Resolved right pontinomedullary cistern extra-axial hemorrhage from prior exam. 3. Unchanged atrophy, chronic small vessel ischemia, and remote lacunar infarcts. Electronically Signed   By: MKeith RakeM.D.   On: 10/20/2018 19:39   ASSESSMENT AND PLAN:   1.  Diplopia; intermittent - With [facility]. - CT brain is negative for acute findings. - Neurology, Dr. RDoy Mince has been consulted for diplopia  - Continue neurologic checks every 2 hours -Patient reported [facility]  Holding off on repeating MRI until patient seen by neurology this morning. -CT scan of the head without contrast with no acute findings.  CT Angie of head with and without contrast with no large vessel occlusion , aneurysm or vascular malformation.  No significant stenosis in anterior circulation.  Mild to moderate stenosis in posterior circulation.  At the time of my evaluation this morning, double vision appears resolved.  Appears to be intermittent. Follow-up on neurology evaluation and recommendations.  2. status post subarachnoid hemorrhage; [facility] findings  Follow-up neurology input  3.  Hypokalemia Replaced  4.  History of EtOH - CIWA protocol  SCDs for DVT prophylaxis    All the records are reviewed and case discussed with Care Management/Social Worker. Management plans discussed with the patient, family and they are in agreement.  CODE STATUS: Full Code  TOTAL TIME TAKING CARE OF THIS PATIENT: 36 minutes.   More than 50% of the time was spent in counseling/coordination of care: YES  POSSIBLE D/C IN 2 DAYS, DEPENDING ON CLINICAL CONDITION.   Kaidance Pantoja M.D on 10/21/2018 at  10:07 AM  Between 7am to 6pm - Pager -  [phone]  After 6pm go to [email or web address] - Proofreader  [facility]  Office  [phone]  CC: Primary care physician; [name], MD  Note: This dictation was prepared with Dragon dictation along with smaller phrase technology. Any transcriptional errors that result from this process are unintentional.

## 2018-10-22 ENCOUNTER — Other Ambulatory Visit: Payer: PRIVATE HEALTH INSURANCE

## 2018-10-22 LAB — BASIC METABOLIC PANEL
Anion gap: 7 (ref 5–15)
BUN: 13 mg/dL (ref 8–23)
CO2: 27 mmol/L (ref 22–32)
Calcium: 9 mg/dL (ref 8.9–10.3)
Chloride: 103 mmol/L (ref 98–111)
Creatinine, Ser: 1.18 mg/dL (ref 0.61–1.24)
GFR calc Af Amer: >60 mL/min (ref >60)
GFR calc non Af Amer: 59 mL/min — ABNORMAL LOW (ref >60)
Glucose, Bld: 99 mg/dL (ref 70–99)
Potassium: 3.3 mmol/L — ABNORMAL LOW (ref 3.5–5.1)
Sodium: 137 mmol/L (ref 135–145)

## 2018-10-22 LAB — MAGNESIUM: Magnesium: 2.3 mg/dL (ref 1.7–2.4)

## 2018-10-22 MED ORDER — HYDROCORTISONE 1 % EX CREA
TOPICAL_CREAM | Freq: Two times a day (BID) | CUTANEOUS | Status: DC
  Administered 2018-10-22 – 2018-10-23 (×2): via TOPICAL
  Filled 2018-10-22: qty 28

## 2018-10-22 MED ORDER — POTASSIUM CHLORIDE CRYS ER 20 MEQ PO TBCR
40.0000 meq | EXTENDED_RELEASE_TABLET | Freq: Once | ORAL | Status: AC
  Administered 2018-10-22: 40 meq via ORAL
  Filled 2018-10-22: qty 2

## 2018-10-22 NOTE — TOC Initial Note (Signed)
Transition of Care (TOC) - Initial/Assessment Note    Patient Details  Name: Cameron Foster. MRN: [id number] Date of Birth: 1941-02-13  Transition of Care [facility]) CM/SW Contact:    Shelbie Hutching, RN Phone Number: 10/22/2018, 11:28 AM  Clinical Narrative:                 Patient is from home where he lives with his wife.  Wife provides transportation.  OT has recommended outpatient OT and PT evaluation while here in the hospital.  Patient is agreeable to outpatient OT.  Patient would also like to get his vision fixed, OT recommends low vision OT which is very specialized.  RNCM will talk with MD about referral to low vision OT, there is one at [facility].    Expected Discharge Plan: OP Rehab Barriers to Discharge: Continued Medical Work up   Patient Goals and CMS Choice Patient states their goals for this hospitalization and ongoing recovery are:: Wants to get his eyes fixed      Expected Discharge Plan and Services Expected Discharge Plan: OP Rehab   Discharge Planning Services: CM Consult   Living arrangements for the past 2 months: Single Family Home                                      Prior Living Arrangements/Services Living arrangements for the past 2 months: Single Family Home Lives with:: Spouse Patient language and need for interpreter reviewed:: No Do you feel safe going back to the place where you live?: Yes      Need for Family Participation in Patient Care: Yes (Comment)(previous stroke) Care giver support system in place?: Yes (comment)(wife)   Criminal Activity/Legal Involvement Pertinent to Current Situation/Hospitalization: No - Comment as needed  Activities of Daily Living Home Assistive Devices/Equipment: Cane (specify quad or straight) ADL Screening (condition at time of admission) Patient's cognitive ability adequate to safely complete daily activities?: Yes Is the patient deaf or have difficulty hearing?: Yes Does the patient have difficulty  seeing, even when wearing glasses/contacts?: No Does the patient have difficulty concentrating, remembering, or making decisions?: No Patient able to express need for assistance with ADLs?: Yes Does the patient have difficulty dressing or bathing?: No Independently performs ADLs?: Yes (appropriate for developmental age) Does the patient have difficulty walking or climbing stairs?: Yes Weakness of Legs: Both Weakness of Arms/Hands: None  Permission Sought/Granted Permission sought to share information with : Case Manager, Other (comment) Permission granted to share information with : Yes, Verbal Permission Granted     Permission granted to share info w AGENCY: [facility]        Emotional Assessment Appearance:: Appears stated age Attitude/Demeanor/Rapport: Engaged Affect (typically observed): Accepting Orientation: : Oriented to Self, Oriented to Place, Oriented to  Time, Oriented to Situation Alcohol / Substance Use: Not Applicable Psych Involvement: No (comment)  Admission diagnosis:  Double vision Patient Active Problem List   Diagnosis Date Noted  . TIA (transient ischemic attack) 10/20/2018  . SAH (subarachnoid hemorrhage) ([place]) 10/16/2018  . Recurrent sinusitis 10/16/2018  . Low libido 08/30/2018  . Class 1 obesity without serious comorbidity with body mass index (BMI) of 31.0 to 31.9 in adult 05/14/2018  . Mixed hyperlipidemia 05/14/2018  . Degeneration of lumbar intervertebral disc 03/22/2018  . Osteoarthritis of hip 03/22/2018  . Trochanteric bursitis of right hip 03/22/2018  . Status post partial colectomy 01/03/2017  .  Depression, major, single episode, mild ([place]) 01/03/2017  . Vasovagal syncope 01/27/2016  . History of skin cancer 10/28/2015  . Osteoarthritis of right knee 10/01/2015  . Essential hypertension 08/25/2015  . Dyspnea on exertion 08/12/2015  . Erectile dysfunction following radiation therapy 08/04/2015  . Constipation 08/02/2015  .  History of prostate cancer 01/20/2015  . Swelling of right lower extremity 01/20/2015   PCP:  [name], MD Pharmacy:   [id number] [place], [address]Phone: [phone]: [phone]    Social Determinants of Health (SDOH) Interventions    Readmission Risk Interventions No flowsheet data found.

## 2018-10-22 NOTE — Consult Note (Addendum)
  Ophthalmology Consult  History:  This is a 78 year old gentleman complaining of intermittent double vision, worse in right gaze.  He has recently been hospitalized due to concerns for a subarachnoid hemorrhage and for diplopia.  Extensive imaging has been performed here and at [facility]; the results have not shown acute changes suggestive of stroke, aneurysm, or vessel occlusion.   Patient reports having had cataract surgery previously, and using readers.  Objective:  Vision: 20/30+ OD  20/30+ OS with readers IOP: 19 / 17 Pupils: PERRL, no APD EOM: Slightly decreased movement of right lateral rectus; right LR has nystagmus in far right gaze. Visual field: Full to confrontation  Anterior Exam: Lids/lashes: Mild blepharitis OU Conjunctiva: Trace injection OU Cornea: clear, wnl OU Anterior chamber: wnl OU Lenses: PCIOL OU  Vitreous: clear Optic nerve: C:D 0.3 OU Macula: wnl on indirect exam Peripheral Retina: wnl on indirect exam  Assessment/Plan:  Partial Right Lateral Rectus Palsy - Most likely caused by microvascular changes associated with chronic HTN or DM.  - Imaging (CT Angio, MRI, etc.) is negative for acute processes (stroke, aneurysm, tumor) - Patient denies temporal headache; ESR is moderately elevated (39), but many entities besides GCA can cause similar elevation.  Recs:  1. Please obtain C-Reactive Protein (CRP). If elevated, please page ophthalmology, as this could suggest need for a temporal artery biopsy in conjunction with the ESR. If CRP is normal, no temporal artery biopsy is warranted.  2. Outpatient follow-up with ophthalmology. Partial 6th nerve palsies due to microvascular changes often resolve on their own over the course of a few months.  Ophthalmology can also potentially give patient prism in his glasses to manage the double vision.  Please page ophthalmology with other questions or concerns.  [name], MD

## 2018-10-22 NOTE — Progress Notes (Signed)
PT Cancellation Note  Patient Details Name: Cameron Foster. MRN: [id number] DOB: 11-Oct-1940   Cancelled Treatment:    Reason Eval/Treat Not Completed: Patient at procedure or test/unavailable, RN reports patient at EEG. Will re-attempt evaluation when patient is available.    Taresa Montville 10/22/2018, 2:33 PM

## 2018-10-22 NOTE — Evaluation (Signed)
Occupational Therapy Evaluation Patient Details Name: Cameron Foster. MRN: [id number] DOB: 1940-08-08 Today's Date: 10/22/2018    History of Present Illness Pt is an 78 y.o. male who recently presented with diplopia on 6/24. Noted to have a [place] and was transferred to [facility] at that time.  Work up was unremarkable.  No evidence of aneurysm.  Patient was discharged home and diplopia resolved.  Patient had sever headache about a week ago.  No recurrence of hemorrhage and no diplopia at that time.  Patient awakened on yesterday with diplopia and presented to [facility] for evaluation on 10/21/18.   Clinical Impression   Pt seen for OT evaluation this date. Pt lives in a 1 story home with his spouse with level entry and walk-in shower. Pt was independent with mobility, ADL, and IADL prior to admission with 2 falls reported in past 12 months. Pt currently presents with R sided sinus pressure/tightness and R eye adduction impairments, leading to diplopia when he looks to the R which is now impacting his functional mobility, safety, and ability to perform ADL and IADL tasks at PLOF.  Pt/family educated in compensatory strategies for his visual deficits to improve safety/indep while minimizing risk of diplopia. Pt verbalized understanding. Pt at supervision to [place] level for mobility during assessment, mild impairments in balance, and supervision level for ADL. PT consult requested by nursing to assess balance. No other deficits appreciated during the assessment. Recommend pt to seek OP OT services (consider low vision specialist OT). RNCM notified.     Follow Up Recommendations  Outpatient OT(consider low vision specialist OT)    Equipment Recommendations  None recommended by OT    Recommendations for Other Services PT consult     Precautions / Restrictions Precautions Precautions: Fall Restrictions Weight Bearing Restrictions: No      Mobility Bed Mobility Overal bed mobility: Independent                 Transfers Overall transfer level: Modified independent               General transfer comment: supervision    Balance Overall balance assessment: Mild deficits observed, not formally tested                                         ADL either performed or assessed with clinical judgement   ADL Overall ADL's : Needs assistance/impaired                                       General ADL Comments: sup to CGA assist for ADL tasks for safety/balance     Vision Baseline Vision/History: Wears glasses Wears Glasses: Reading only Patient Visual Report: Diplopia;Other (comment)(diplopia when looking R (R eye abduction limited)) Vision Assessment?: Yes Eye Alignment: Within Functional Limits Ocular Range of Motion: Other (comment);Restricted on the right(R eye restricted on the right) Alignment/Gaze Preference: Within Defined Limits Tracking/Visual Pursuits: Right eye does not track laterally Visual Fields: No apparent deficits Diplopia Assessment: Only with right gaze     Perception     Praxis      Pertinent Vitals/Pain Pain Assessment: Faces Faces Pain Scale: Hurts a little bit Pain Location: pt reporting R sided sinus pressure/tightness Pain Descriptors / Indicators: Tightness;Pressure Pain Intervention(s): Limited activity within patient's tolerance;Monitored during session  Hand Dominance     Extremity/Trunk Assessment Upper Extremity Assessment Upper Extremity Assessment: Overall WFL for tasks assessed   Lower Extremity Assessment Lower Extremity Assessment: Overall WFL for tasks assessed   Cervical / Trunk Assessment Cervical / Trunk Assessment: Normal   Communication Communication Communication: No difficulties   Cognition Arousal/Alertness: Awake/alert Behavior During Therapy: WFL for tasks assessed/performed Overall Cognitive Status: Within Functional Limits for tasks assessed                                      General Comments       Exercises Other Exercises Other Exercises: Pt instructed in visual compensatory strategies to minimize risk of diplopia when turning eyes to R when performing ADL and functional mobility and watching out for environmental hazards   Shoulder Instructions      Home Living Family/patient expects to be discharged to:: Private residence Living Arrangements: Spouse/significant other Available Help at Discharge: Family;Available 24 hours/day Type of Home: House Home Access: Level entry     Home Layout: One level     Bathroom Shower/Tub: Occupational psychologist: Handicapped height     Home Equipment: Cane - single point          Prior Functioning/Environment Level of Independence: Independent        Comments: Pt reports being indep with ADL/IADL/functional moblity, PRN SPC use, wife does most of cooking/cleaning; 2 falls in 12 months        OT Problem List: Impaired balance (sitting and/or standing);Impaired vision/perception      OT Treatment/Interventions: Self-care/ADL training;Therapeutic exercise;Therapeutic activities;Visual/perceptual remediation/compensation;DME and/or AE instruction;Patient/family education;Balance training    OT Goals(Current goals can be found in the care plan section) Acute Rehab OT Goals Patient Stated Goal: get rid of this eye issue and get home OT Goal Formulation: With patient Time For Goal Achievement: 11/05/18 Potential to Achieve Goals: Good ADL Goals Additional ADL Goal #1: Pt will demonstrate independent use of compensatory vision strategies to perform ADL and functional mobility tasks requiring occasional verbal cues. Additional ADL Goal #2: Pt will perform morning ADL routine with no LOB and no pt reported diplopia.  OT Frequency: Min 1X/week   Barriers to D/C:            Co-evaluation              AM-PAC OT "6 Clicks" Daily Activity     Outcome Measure Help from  another person eating meals?: None Help from another person taking care of personal grooming?: None Help from another person toileting, which includes using toliet, bedpan, or urinal?: A Little Help from another person bathing (including washing, rinsing, drying)?: A Little Help from another person to put on and taking off regular upper body clothing?: None Help from another person to put on and taking off regular lower body clothing?: A Little 6 Click Score: 21   End of Session Nurse Communication: Other (comment)(PT consult for balance)  Activity Tolerance: Patient tolerated treatment well Patient left: in bed;with call bell/phone within reach;with bed alarm set  OT Visit Diagnosis: Other abnormalities of gait and mobility (R26.89);Low vision, both eyes (H54.2)                Time: 8588-5027 OT Time Calculation (min): 29 min Charges:  OT General Charges $OT Visit: 1 Visit OT Evaluation $OT Eval Low Complexity: 1 Low OT Treatments $Self Care/Home Management :  8-22 mins  Jeni Salles, MPH, MS, OTR/L ascom [phone] 10/22/18, 11:05 AM

## 2018-10-22 NOTE — Evaluation (Signed)
Physical Therapy Evaluation Patient Details Name: Cameron Foster. MRN: [id number] DOB: 1941-03-11 Today's Date: 10/22/2018   History of Present Illness  Pt is an 78 y.o. male who recently presented with diplopia on 6/24. Noted to have a [place] and was transferred to [facility] at that time.  Work up was unremarkable.  No evidence of aneurysm.  Patient was discharged home and diplopia resolved.  Patient had sever headache about a week ago.  No recurrence of hemorrhage and no diplopia at that time.  Patient awakened on yesterday with diplopia and presented to [facility] for evaluation on 10/21/18.    Clinical Impression  Patient received in room with NA present. Agrees to PT evaluation. Patient demonstrates modified independence for bed mobility, requires min assist with standing due to poor balance. Patient has normal strength in B LEs, reports normal sensation, however balance is significantly impaired. Patient is unable to achieve standing feet together and with feet 6 inches apart he is unable to maintain standing balance. Attempted standing with feet in semi tandem position and is unable to maintain balance in this position either. Patient ambulated 30 feet in room with min guard assist with one episode of LOB where he leaned on wall to recover. Patient will benefit from skilled PT to address his poor balance and improve safety with mobility.        Follow Up Recommendations Outpatient PT    Equipment Recommendations  Rolling walker with 5" wheels    Recommendations for Other Services       Precautions / Restrictions Precautions Precautions: Fall Restrictions Weight Bearing Restrictions: No      Mobility  Bed Mobility Overal bed mobility: Independent                Transfers Overall transfer level: Needs assistance Equipment used: None Transfers: Sit to/from Stand Sit to Stand: Min guard         General transfer comment: unsteady this visit with initial standing balance, needed to  sit back down due to loss of balance posteriorly  Ambulation/Gait Ambulation/Gait assistance: Min guard Gait Distance (Feet): 15 Feet Assistive device: None Gait Pattern/deviations: Drifts right/left Gait velocity: decreased   General Gait Details: 1 lob to right, leaned against wall to regain balance.  Stairs            Wheelchair Mobility    Modified Rankin (Stroke Patients Only)       Balance Overall balance assessment: History of Falls;Needs assistance Sitting-balance support: Single extremity supported Sitting balance-Leahy Scale: Good     Standing balance support: No upper extremity supported Standing balance-Leahy Scale: Poor                               Pertinent Vitals/Pain Pain Assessment: No/denies pain    Home Living Family/patient expects to be discharged to:: Private residence Living Arrangements: Spouse/significant other Available Help at Discharge: Family;Available 24 hours/day Type of Home: House Home Access: Level entry     Home Layout: One level Home Equipment: Cane - single point      Prior Function Level of Independence: Independent         Comments: Pt reports being indep with ADL/IADL/functional moblity, PRN SPC use, wife does most of cooking/cleaning; 2 falls in 12 months     Hand Dominance        Extremity/Trunk Assessment   Upper Extremity Assessment Upper Extremity Assessment: Defer to OT evaluation  Lower Extremity Assessment Lower Extremity Assessment: Overall WFL for tasks assessed    Cervical / Trunk Assessment Cervical / Trunk Assessment: Normal  Communication   Communication: No difficulties  Cognition Arousal/Alertness: Awake/alert Behavior During Therapy: WFL for tasks assessed/performed Overall Cognitive Status: Within Functional Limits for tasks assessed                                        General Comments General comments (skin integrity, edema, etc.): patient  is unable to stand feet together or semi tandem without loosing balance posteriorly.    Exercises     Assessment/Plan    PT Assessment Patient needs continued PT services  PT Problem List Decreased safety awareness;Decreased activity tolerance;Decreased balance;Decreased knowledge of use of DME;Decreased coordination       PT Treatment Interventions DME instruction;Therapeutic activities;Gait training;Therapeutic exercise;Functional mobility training;Neuromuscular re-education;Balance training;Patient/family education    PT Goals (Current goals can be found in the Care Plan section)  Acute Rehab PT Goals Patient Stated Goal: get rid of this eye issue and get home PT Goal Formulation: With patient Time For Goal Achievement: 10/27/18 Potential to Achieve Goals: Good    Frequency Min 3X/week   Barriers to discharge        Co-evaluation               AM-PAC PT "6 Clicks" Mobility  Outcome Measure Help needed turning from your back to your side while in a flat bed without using bedrails?: A Little Help needed moving from lying on your back to sitting on the side of a flat bed without using bedrails?: A Little Help needed moving to and from a bed to a chair (including a wheelchair)?: A Little Help needed standing up from a chair using your arms (e.g., wheelchair or bedside chair)?: A Little Help needed to walk in hospital room?: A Little Help needed climbing 3-5 steps with a railing? : A Little 6 Click Score: 18    End of Session Equipment Utilized During Treatment: Gait belt Activity Tolerance: Patient limited by fatigue Patient left: in bed;with bed alarm set Nurse Communication: Mobility status PT Visit Diagnosis: Unsteadiness on feet (R26.81);History of falling (Z91.81)    Time: 1450-1515 PT Time Calculation (min) (ACUTE ONLY): 25 min   Charges:   PT Evaluation $PT Eval Moderate Complexity: 1 Mod PT Treatments $Gait Training: 8-22 mins        Larena Ohnemus, PT, GCS 10/22/18,3:33 PM

## 2018-10-22 NOTE — Procedures (Signed)
History: 78 yo M with intermittent siploplia  Sedation: none  Technique: This is a 21 channel routine scalp EEG performed at the bedside with bipolar and monopolar montages arranged in accordance to the international 10/20 system of electrode placement. One channel was dedicated to EKG recording.    Background: The background consists of intermixed alpha and beta activities. There is a well defined posterior dominant rhythm of 9 Hz that attenuates with eye opening. Sleep is recorded with normal appearing structures.   Photic stimulation: Physiologic driving is present  EEG Abnormalities: none  Clinical Interpretation: This normal EEG is recorded in the waking and sleep state. There was no seizure or seizure predisposition recorded on this study. Please note that lack of epileptiform activity on EEG does not preclude the possibility of epilepsy.   Roland Rack, MD [facility] [phone]  If 7pm- 7am, please page neurology on call as listed in [place].

## 2018-10-22 NOTE — Progress Notes (Signed)
eeg completed ° °

## 2018-10-22 NOTE — Progress Notes (Signed)
[facility] [place] NAME: Cameron Foster    MR#:  [id number]  DATE OF BIRTH:  09-26-1940  SUBJECTIVE:  CHIEF COMPLAINT:   Chief Complaint  Patient presents with  . Diplopia   No new complaint this morning.  Admitted with diplopia .  No headaches.  No chest pain.  No shortness of breath. Still have some diplopia especially when he is looking to his right side.  REVIEW OF SYSTEMS:  Review of Systems  Constitutional: Negative for chills and fever.  HENT: Negative for hearing loss and tinnitus.   Eyes: Positive for double vision. Negative for pain.  Respiratory: Negative for cough and shortness of breath.   Cardiovascular: Negative for chest pain and palpitations.  Gastrointestinal: Negative for heartburn and nausea.  Genitourinary: Negative for dysuria and urgency.  Musculoskeletal: Negative for myalgias and neck pain.  Skin: Negative for itching and rash.  Neurological: Negative for dizziness and headaches.  Psychiatric/Behavioral: Negative for depression and hallucinations.    DRUG ALLERGIES:   Allergies  Allergen Reactions  . Augmentin [Amoxicillin-Pot Clavulanate] Other (See Comments)    Abdominal upset Has patient had a PCN reaction causing immediate rash, facial/tongue/throat swelling, SOB or lightheadedness with hypotension: Unknown Has patient had a PCN reaction causing severe rash involving mucus membranes or skin necrosis: Unknown Has patient had a PCN reaction that required hospitalization: Unknown Has patient had a PCN reaction occurring within the last 10 years: Unknown If all of the above answers are "NO", then may proceed with Cephalosporin use.   . Formaldehyde Rash  . Tape Rash    Pt states he is not allergic to tape.   VITALS:  Blood pressure 131/80, pulse 67, temperature 98 F (36.7 C), temperature source Oral, resp. rate 17, height 5' 9"  (1.753 m), SpO2 95 %. PHYSICAL EXAMINATION:  Physical Exam    GENERAL:  78 y.o.-year-old patient lying in the bed with no acute distress.  EYES: Pupils equal, round, reactive to light and accommodation. No scleral icterus.  No nystagmus this morning  HEENT: Head atraumatic, normocephalic. Oropharynx and nasopharynx clear.  NECK:  Supple, no jugular venous distention. No thyroid enlargement, no tenderness.  LUNGS: Normal breath sounds bilaterally, no wheezing, rales,rhonchi or crepitation. No use of accessory muscles of respiration.  CARDIOVASCULAR: Regular rate and rhythm, S1, S2 normal. No murmurs, rubs, or gallops.  ABDOMEN: Soft, nondistended, nontender. Bowel sounds present. No organomegaly or mass.  EXTREMITIES: 1+ edema bilaterally.  No cyanosis, or clubbing.  NEUROLOGIC:  Cranial nerves intact this morning.  Muscle strength 5/5 in all extremities. Sensation intact. Gait not checked.  PSYCHIATRIC: The patient is alert and oriented x 3.  Normal affect and good eye contact. SKIN: No obvious rash, lesion, or ulcer.   LABORATORY PANEL:  Male CBC Recent Labs  Lab 10/21/18 0459  WBC 5.8  HGB 11.0*  HCT 34.6*  PLT 250   ------------------------------------------------------------------------------------------------------------------ Chemistries  Recent Labs  Lab 10/22/18 0541  NA 137  K 3.3*  CL 103  CO2 27  GLUCOSE 99  BUN 13  CREATININE 1.18  CALCIUM 9.0  MG 2.3   RADIOLOGY:  Mr Jeri Cos Wo Contrast  Result Date: 10/21/2018 CLINICAL DATA:  Recent diagnosis of stroke. Worsening double vision. EXAM: MRI HEAD WITHOUT AND WITH CONTRAST TECHNIQUE: Multiplanar, multiecho pulse sequences of the brain and surrounding structures were obtained without and with intravenous contrast. CONTRAST:  9 cc Gadavist COMPARISON:  CT studies 10/20/2018 and 10/21/2018. Other prior  CT examinations from June of this year. FINDINGS: Brain: Diffusion imaging does not show any acute or subacute infarction. There chronic small-vessel ischemic changes of the pons.  There are numerous old small vessel cerebellar infarctions affecting both cerebellar hemispheres. Cerebral hemispheres show old infarctions affecting the thalami in scattered within the deep and subcortical Cliett matter. No large vessel territory infarction. No mass lesion. No intra-axial hemorrhage. Few punctate foci of hemosiderin deposition associated with some of the old small vessel infarctions. Previously seen extra-axial hemorrhage in the perimesencephalic cistern/foramen magnum on the right in late June is no longer visible. Vascular: Major vessels at the base of the brain show flow. Right vertebral artery appears to terminate in PICA. Skull and upper cervical spine: Negative Sinuses/Orbits: Clear/normal Other: None IMPRESSION: No acute or subacute finding. Extensive old infarctions throughout the brain as outlined above. Resolution of previously seen subarachnoid hemorrhage at base of the brain on the right in June. Electronically Signed   By: Nelson Chimes M.D.   On: 10/21/2018 15:01   ASSESSMENT AND PLAN:   1.  Diplopia; intermittent - With recent subarachnoid hemorrhage resulting in transfer to [facility] for evaluation and ultimately patient's disposition home. - CT brain is negative for acute findings. - Neurology, Dr. Doy Mince, has been consulted for diplopia  - Continue neurologic checks every 2 hours -Patient reported having complete stroke work-up including MRI of the brain done at [facility] recently.   -CT scan of the head without contrast with no acute findings.  CT Angie of head with and without contrast with no large vessel occlusion , aneurysm or vascular malformation.  No significant stenosis in anterior circulation.  Mild to moderate stenosis in posterior circulation.  At the time of my evaluation this morning, double vision appears resolved.  Appears to be intermittent. Followed-up on neurology evaluation and recommendations. MRI brain without any acute findings.  Checked for ESR, TSH and free T4. Neurologist ordered myasthenia gravis work-up. Also suggested to get ophthalmology evaluation.  2. status post subarachnoid hemorrhage; recently managed at [facility] head without contrast last night with no acute findings  Follow-up neurology input  3.  Hypokalemia Replaced  4.  History of EtOH - CIWA protocol  SCDs for DVT prophylaxis    All the records are reviewed and case discussed with Care Management/Social Worker. Management plans discussed with the patient, family and they are in agreement.  CODE STATUS: Full Code  TOTAL TIME TAKING CARE OF THIS PATIENT: 36 minutes.   More than 50% of the time was spent in counseling/coordination of care: YES  POSSIBLE D/C IN 2 DAYS, DEPENDING ON CLINICAL CONDITION.   Vaughan Basta M.D on 10/22/2018 at 2:35 PM  Between 7am to 6pm - Pager - [phone]  After 6pm go to [email or web address] - Proofreader  [facility]  Office  [phone]  CC: Primary care physician; [name], MD  Note: This dictation was prepared with Dragon dictation along with smaller phrase technology. Any transcriptional errors that result from this process are unintentional.

## 2018-10-22 NOTE — Progress Notes (Addendum)
Reason for Consult:Diplopia Referring Physician: Stark Jock  CC: Diplopia  HPI: Cameron Foster. is an 78 y.o. male presenting in 6/24 with diplopia.  Noted to have a [place] and was transferred to [facility] at that time.  Work up was unremarkable.  No evidence of aneurysm.  Patient was discharged home and diplopia resolved.  Patient had sever headache about a week ago.  No recurrence of hemorrhage and no diplopia at that time.  Patient awakened on yesterday with diplopia.  Presented for evaluation.  [facility] contacted and did not feel that transfer was warranted since repeat head CT showed no hemorrhage.  Patient seen today, resting comfortably in bed. Not c/o diplopia. No HA, no nausea/no vting  Patient had MRI w/wo contrast on 7/ 5: No acute or subacute finding. Extensive old infarctions throughout the brain Resolution of previously seen subarachnoid hemorrhage at base of the brain on the right in June.  CTA on 7/5: No large vessel occlusion, aneurysm, or vascular malformation identified.Intracranial atherosclerosis with areas of mild-to-moderate stenosis in the posterior circulations. No significant stenosis in the anterior circulation.    Past Medical History:  Diagnosis Date  . Alcoholism ([place])   . Arthritis   . Atrial fibrillation ([place])    one episode  . Colon polyp   . Depression    Son died 05-13-2014  . Diverticulitis   . Diverticulosis 30 years  . Dysrhythmia    At Fib 1995  . GERD (gastroesophageal reflux disease)   . Hyperlipidemia   . Hypertension   . Irritable bowel syndrome   . Prostate cancer ([place]) 05-13-2010   treated with radiation therapy. Prostate  . Vasovagal syncope     Past Surgical History:  Procedure Laterality Date  . [place] no stents  . CATARACT EXTRACTION, BILATERAL    . COLON RESECTION SIGMOID N/A 12/07/2016   Procedure: COLON RESECTION SIGMOID;  Surgeon: Clayburn Pert, MD;  Location: [facility] ORS;  Service: General;  Laterality:  N/A;  . COLON SURGERY  11/2016   Colostomy  . COLONOSCOPY  05-13-14  . COLONOSCOPY WITH PROPOFOL N/A 05/30/2016   Procedure: COLONOSCOPY WITH PROPOFOL;  Surgeon: Lollie Sails, MD;  Location: [facility] ENDOSCOPY;  Service: Endoscopy;  Laterality: N/A;  . COLOSTOMY Left 12/07/2016   Procedure: COLOSTOMY;  Surgeon: Clayburn Pert, MD;  Location: [facility] ORS;  Service: General;  Laterality: Left;  . COLOSTOMY REVERSAL N/A 03/21/2017   Procedure: COLOSTOMY REVERSAL;  Surgeon: Clayburn Pert, MD;  Location: [facility] ORS;  Service: General;  Laterality: N/A;  . COLOSTOMY TAKEDOWN N/A 03/21/2017   Procedure: LAPAROSCOPIC COLOSTOMY TAKEDOWN;  Surgeon: Clayburn Pert, MD;  Location: [facility] ORS;  Service: General;  Laterality: N/A;  . CYSTOSCOPY WITH STENT PLACEMENT Bilateral 03/21/2017   Procedure: CYSTOSCOPY WITH LIGHTED STENT PLACEMENT;  Surgeon: Abbie Sons, MD;  Location: [facility] ORS;  Service: Urology;  Laterality: Bilateral;  . ESOPHAGOGASTRODUODENOSCOPY    . ESOPHAGOGASTRODUODENOSCOPY N/A 05/30/2016   Procedure: ESOPHAGOGASTRODUODENOSCOPY (EGD);  Surgeon: Lollie Sails, MD;  Location: [facility] ENDOSCOPY;  Service: Endoscopy;  Laterality: N/A;  . EYE SURGERY     cataracts  . FLEXIBLE SIGMOIDOSCOPY N/A 03/21/2017   Procedure: FLEXIBLE SIGMOIDOSCOPY;  Surgeon: Clayburn Pert, MD;  Location: [facility] ORS;  Service: General;  Laterality: N/A;  . FRACTURE SURGERY Bilateral    right arm and left wrist  . INCISION AND DRAINAGE ABSCESS N/A 12/07/2016   Procedure: DRAINAGE  OF INTRA ABDOMINAL ABSCESS;  Surgeon: Clayburn Pert, MD;  Location: [facility]  ORS;  Service: General;  Laterality: N/A;  . KNEE ARTHROSCOPY    . LAPAROTOMY N/A 12/07/2016   Procedure: EXPLORATORY LAPAROTOMY;  Surgeon: Woodham, Charles, MD;  Location: [facility] ORS;  Service: General;  Laterality: N/A;  . PROSTATE SURGERY     Microwave therapy  . TONSILLECTOMY    . TOTAL KNEE ARTHROPLASTY Right 07/27/2016   Procedure: TOTAL KNEE ARTHROPLASTY;  Surgeon:  Howard Miller, MD;  Location: [facility] ORS;  Service: Orthopedics;  Laterality: Right;  Dr. Brandon had to place Urinary catheter due to prostate cancer history.  Using flexible scope.    Family History  Problem Relation Age of Onset  . Lung cancer Father        smoker  . Other Mother   . Sudden death Son        due to "Blood clots"  . Bipolar disorder Son   . Kidney disease Daughter        congenital one small kidney  . Prostate cancer Neg Hx   . Bladder Cancer Neg Hx     Social History:  reports that he quit smoking about 30 years ago. His smoking use included cigarettes. He has a 27.00 pack-year smoking history. He has quit using smokeless tobacco.  His smokeless tobacco use included chew. He reports current alcohol use of about 3.0 standard drinks of alcohol per week. He reports that he does not use drugs.  Allergies  Allergen Reactions  . Augmentin [Amoxicillin-Pot Clavulanate] Other (See Comments)    Abdominal upset Has patient had a PCN reaction causing immediate rash, facial/tongue/throat swelling, SOB or lightheadedness with hypotension: Unknown Has patient had a PCN reaction causing severe rash involving mucus membranes or skin necrosis: Unknown Has patient had a PCN reaction that required hospitalization: Unknown Has patient had a PCN reaction occurring within the last 10 years: Unknown If all of the above answers are "NO", then may proceed with Cephalosporin use.   . Formaldehyde Rash  . Tape Rash    Pt states he is not allergic to tape.    Medications:  I have reviewed the patient's current medications. Prior to Admission:  Medications Prior to Admission  Medication Sig Dispense Refill Last Dose  . amLODipine (NORVASC) 10 MG tablet Take 1 tablet (10 mg total) by mouth daily for 30 days. 30 tablet 0 Past Week at Unknown time  . docusate sodium (STOOL SOFTENER) 100 MG capsule Take 100 mg by mouth 2 (two) times daily.   Past Week at Unknown time  . doxycycline  (VIBRA-TABS) 100 MG tablet Take 1 tablet (100 mg total) by mouth 2 (two) times daily. 20 tablet 0 Past Week at Unknown time  . fluticasone (FLONASE) 50 MCG/ACT nasal spray Place 1 spray into both nostrils daily as needed for allergies or rhinitis.   prn at prn  . furosemide (LASIX) 20 MG tablet Take 1 tablet (20 mg total) by mouth daily. 90 tablet 3 Past Week at Unknown time  . hydrocortisone 2.5 % cream APPLY CREAM TO AFFECTED AREA TWICE DAILY AS NEEDED FOR RASH 28 g 11 prn at prn  . lisinopril (PRINIVIL,ZESTRIL) 10 MG tablet TAKE 1 TABLET BY MOUTH EVERY DAY 90 tablet 1 Past Week at Unknown time  . liver oil-zinc oxide (DESITIN) 40 % ointment Apply 1 application topically daily as needed for irritation.   prn at prn  . meloxicam (MOBIC) 15 MG tablet Take 15 mg by mouth daily.    Past Week at Unknown time  . nebivolol (BYSTOLIC)   10 MG tablet Take 1 tablet (10 mg total) by mouth daily. 90 tablet 2 Past Week at Unknown time  . omeprazole (PRILOSEC) 20 MG capsule Take 20 mg by mouth daily as needed (heartburn).    prn at prn  . sertraline (ZOLOFT) 100 MG tablet Take 1 tablet (100 mg total) by mouth daily. 90 tablet 3 Past Week at Unknown time   Scheduled: . amLODipine  10 mg Oral Daily  . docusate sodium  100 mg Oral BID  . folic acid  1 mg Oral Daily  . furosemide  20 mg Oral Daily  . lisinopril  10 mg Oral Daily  . multivitamin with minerals  1 tablet Oral Daily  . nebivolol  10 mg Oral Daily  . pantoprazole  40 mg Oral Daily  . sertraline  100 mg Oral Daily  . thiamine  100 mg Oral Daily   Or  . thiamine  100 mg Intravenous Daily    ROS: History obtained from the patient  General ROS: negative for - chills, fatigue, fever, night sweats, weight gain or weight loss Psychological ROS: negative for - behavioral disorder, hallucinations, memory difficulties, mood swings or suicidal ideation Ophthalmic ROS: as noted in HPI ENT ROS: negative for - epistaxis, nasal discharge, oral lesions,  sore throat, tinnitus or vertigo Allergy and Immunology ROS: negative for - hives or itchy/watery eyes Hematological and Lymphatic ROS: negative for - bleeding problems, bruising or swollen lymph nodes Endocrine ROS: negative for - galactorrhea, hair pattern changes, polydipsia/polyuria or temperature intolerance Respiratory ROS: negative for - cough, hemoptysis, shortness of breath or wheezing Cardiovascular ROS: negative for - chest pain, dyspnea on exertion, edema or irregular heartbeat Gastrointestinal ROS: negative for - abdominal pain, diarrhea, hematemesis, nausea/vomiting or stool incontinence Genito-Urinary ROS: negative for - dysuria, hematuria, incontinence or urinary frequency/urgency Musculoskeletal ROS: negative for - joint swelling or muscular weakness Neurological ROS: as noted in HPI Dermatological ROS: negative for rash and skin lesion changes  Physical Examination: Blood pressure 130/90, pulse 63, temperature 98 F (36.7 C), temperature source Oral, resp. rate 17, height 5' 9" (1.753 m), SpO2 95 %.  HEENT-  Normocephalic, no lesions, without obvious abnormality.  Normal external eye and conjunctiva.  Normal TM's bilaterally.  Normal auditory canals and external ears. Normal external nose, mucus membranes and septum.  Normal pharynx. Cardiovascular- S1, S2 normal, pulses palpable throughout   Lungs- chest clear, no wheezing, rales, normal symmetric air entry Abdomen- soft, non-tender; bowel sounds normal; no masses,  no organomegaly Extremities- right leg edema Lymph-no adenopathy palpable Musculoskeletal-no joint tenderness, deformity or swelling Skin-warm and dry, no hyperpigmentation, vitiligo, or suspicious lesions  Neurological Examination  Alert, awake, oriented x3, speech nle, following commands CN: PERLLA. Right 6th, no nystagmus, face symmetrical, face sensation nle, palate and tongue midline No motor deficit appreciated No sensory deficit appreciated DTR not  checked at time of examination No coordination defeicit appreciated Gait not checked at the time of examination  Laboratory Studies:   Basic Metabolic Panel: Recent Labs  Lab 10/20/18 2146 10/21/18 0459 10/22/18 0541  NA 136 136 137  K 3.2* 3.5 3.3*  CL 103 106 103  CO2 23 25 27  GLUCOSE 106* 94 99  BUN 12 9 13  CREATININE 1.25* 1.02 1.18  CALCIUM 8.8* 8.9 9.0  MG  --   --  2.3    Liver Function Tests: No results for input(s): AST, ALT, ALKPHOS, BILITOT, PROT, ALBUMIN in the last 168 hours. No results for input(s):   LIPASE, AMYLASE in the last 168 hours. No results for input(s): AMMONIA in the last 168 hours.  CBC: Recent Labs  Lab 10/20/18 2146 10/21/18 0459  WBC 7.0 5.8  NEUTROABS 4.9  --   HGB 11.4* 11.0*  HCT 35.1* 34.6*  MCV 87.1 86.7  PLT 273 250    Cardiac Enzymes: No results for input(s): CKTOTAL, CKMB, CKMBINDEX, TROPONINI in the last 168 hours.  BNP: Invalid input(s): POCBNP  CBG: No results for input(s): GLUCAP in the last 168 hours.  Microbiology: Results for orders placed or performed during the hospital encounter of 10/20/18  MRSA PCR Screening     Status: None   Collection Time: 10/21/18  2:41 AM   Specimen: Nasopharyngeal  Result Value Ref Range Status   MRSA by PCR NEGATIVE NEGATIVE Final    Comment:        The GeneXpert MRSA Assay (FDA approved for NASAL specimens only), is one component of a comprehensive MRSA colonization surveillance program. It is not intended to diagnose MRSA infection nor to guide or monitor treatment for MRSA infections. Performed at [facility], [address] [place], [place] [address]     Coagulation Studies: Recent Labs    10/20/18 2146  LABPROT 13.1  INR 1.0    Urinalysis: No results for input(s): COLORURINE, LABSPEC, PHURINE, GLUCOSEU, HGBUR, BILIRUBINUR, KETONESUR, PROTEINUR, UROBILINOGEN, NITRITE, LEUKOCYTESUR in the last 168 hours.  Invalid input(s): APPERANCEUR  Lipid  Panel:     Component Value Date/Time   CHOL 213 (H) 05/18/2018 0826   TRIG 143 05/18/2018 0826   HDL 43 05/18/2018 0826   CHOLHDL 5.0 05/18/2018 0826   LDLCALC 141 (H) 05/18/2018 0826    HgbA1C:  Lab Results  Component Value Date   HGBA1C 5.3 10/21/2018    Urine Drug Screen:  No results found for: LABOPIA, COCAINSCRNUR, LABBENZ, AMPHETMU, THCU, LABBARB  Alcohol Level:  Recent Labs  Lab 10/20/18 2146  ETH <10    Other results: EKG: sinus rhythm at 67 bpm  Imaging: Ct Angio Head W Or Wo Contrast  Result Date: 10/21/2018 CLINICAL DATA:  78 y/o M; history of intracranial hemorrhage for follow-up. EXAM: CT ANGIOGRAPHY HEAD TECHNIQUE: Multidetector CT imaging of the head was performed using the standard protocol during bolus administration of intravenous contrast. Multiplanar CT image reconstructions and MIPs were obtained to evaluate the vascular anatomy. CONTRAST:  75mL OMNIPAQUE IOHEXOL 350 MG/ML SOLN COMPARISON:  10/20/2018 CT head. FINDINGS: CTA HEAD Anterior circulation: No significant stenosis, proximal occlusion, aneurysm, or vascular malformation. Mild non stenotic calcific atherosclerosis of the internal carotid arteries. Posterior circulation: Mild irregularity of the diminutive right vertebral artery at the PICA junction with segments of mild-to-moderate stenosis. Left P2 tandem segments of moderate stenosis and right P2 segment of mild stenosis. Otherwise no significant stenosis, proximal occlusion, aneurysm, or vascular malformation. Venous sinuses: As permitted by contrast timing, patent. Anatomic variants: Fetal right PCA. Left dominant vertebrobasilar system. Small anterior and left posterior communicating arteries. IMPRESSION: 1. No large vessel occlusion, aneurysm, or vascular malformation identified. 2. Intracranial atherosclerosis with areas of mild-to-moderate stenosis in the posterior circulations. No significant stenosis in the anterior circulation. Electronically  Signed   By: Lance  Furusawa-Stratton M.D.   On: 10/21/2018 01:16   Ct Head Wo Contrast  Result Date: 10/20/2018 CLINICAL DATA:  Neuro deficit(s), subacute. Double vision. Patient reports recent stroke. EXAM: CT HEAD WITHOUT CONTRAST TECHNIQUE: Contiguous axial images were obtained from the base of the skull through the vertex without intravenous contrast.   COMPARISON:  Head CT 10/14/2018 trauma 10/10/2018 FINDINGS: Brain: Previous extra-axial hemorrhage along the pons and medulla has resolved without definite residual. No new or acute hemorrhage. No evidence of acute ischemia. Unchanged remote bilateral cerebellar infarcts in the cone or infarct in the right thalamus. Unchanged degree of atrophy and chronic small vessel ischemia. No midline shift or mass effect. No hydrocephalus. Vascular: Atherosclerotic vascular calcifications without hyperdense vessel. Skull: No fracture or focal lesion. Sinuses/Orbits: Paranasal sinuses and mastoid air cells are clear. The visualized orbits are unremarkable. Other: None. IMPRESSION: 1. No acute intracranial abnormality. 2. Resolved right pontinomedullary cistern extra-axial hemorrhage from prior exam. 3. Unchanged atrophy, chronic small vessel ischemia, and remote lacunar infarcts. Electronically Signed   By: Keith Rake M.D.   On: 10/20/2018 19:39   Mr Jeri Cos ER Contrast  Result Date: 10/21/2018 CLINICAL DATA:  Recent diagnosis of stroke. Worsening double vision. EXAM: MRI HEAD WITHOUT AND WITH CONTRAST TECHNIQUE: Multiplanar, multiecho pulse sequences of the brain and surrounding structures were obtained without and with intravenous contrast. CONTRAST:  9 cc Gadavist COMPARISON:  CT studies 10/20/2018 and 10/21/2018. Other prior CT examinations from June of this year. FINDINGS: Brain: Diffusion imaging does not show any acute or subacute infarction. There chronic small-vessel ischemic changes of the pons. There are numerous old small vessel cerebellar infarctions  affecting both cerebellar hemispheres. Cerebral hemispheres show old infarctions affecting the thalami in scattered within the deep and subcortical [place] matter. No large vessel territory infarction. No mass lesion. No intra-axial hemorrhage. Few punctate foci of hemosiderin deposition associated with some of the old small vessel infarctions. Previously seen extra-axial hemorrhage in the perimesencephalic cistern/foramen magnum on the right in late June is no longer visible. Vascular: Major vessels at the base of the brain show flow. Right vertebral artery appears to terminate in PICA. Skull and upper cervical spine: Negative Sinuses/Orbits: Clear/normal Other: None IMPRESSION: No acute or subacute finding. Extensive old infarctions throughout the brain as outlined above. Resolution of previously seen subarachnoid hemorrhage at base of the brain on the right in June. Electronically Signed   By: Nelson Chimes M.D.   On: 10/21/2018 15:01     Assessment/Plan: 78 year old male with recurrent diplopia.  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  Head CT shows no evidence of recurrence of SAH.  CTA shows no evidence of aneurysm.  MRI w/wo No acute or subacute finding. Extensive old infarctions throughout the brain Resolution of previously seen subarachnoid hemorrhage at base of the brain on the right in June.Etiology unclear.  Recommendations: 1. Neuro protectives measures including normothermia, normoglycemia, correct electrolytes/metabolic abnlities.  2. Would further investigate possible thyroid abnormalities.  Also B1, ESR, myasthenia panel, A1 3. Recommend also an ophtalmology consult to r/o any eye oph cause of diplopia. 4- PT/OT  10/22/2018, 9:43 AM

## 2018-10-23 LAB — NOVEL CORONAVIRUS, NAA (HOSP ORDER, SEND-OUT TO REF LAB; TAT 18-24 HRS): SARS-CoV-2, NAA: NOT DETECTED

## 2018-10-23 LAB — C-REACTIVE PROTEIN: CRP: 1 mg/dL — ABNORMAL HIGH (ref <1.0)

## 2018-10-23 MED ORDER — ADULT MULTIVITAMIN W/MINERALS CH: 1.0000 | ORAL_TABLET | Freq: Every day | ORAL | 0 refills | Status: DC

## 2018-10-23 NOTE — Care Management Important Message (Signed)
Important Message  Patient Details  Name: Cameron Foster. MRN: [id number] Date of Birth: [birth year]   Medicare Important Message Given:  Yes     Juliann Pulse A Terrie Grajales 10/23/2018, 11:29 AM

## 2018-10-23 NOTE — Progress Notes (Signed)
Physical Therapy Treatment Patient Details Name: Cameron Foster. MRN: [id number] DOB: July 18, 1940 Today's Date: 10/23/2018    History of Present Illness Pt is an 78 y.o. male who recently presented with diplopia on 6/24. Noted to have a [place] and was transferred to [facility] at that time.  Work up was unremarkable.  No evidence of aneurysm.  Patient was discharged home and diplopia resolved.  Patient had sever headache about a week ago.  No recurrence of hemorrhage and no diplopia at that time.  Patient awakened on yesterday with diplopia and presented to [facility] for evaluation on 10/21/18.    PT Comments    Patient received in bed sleeping, easily aroused although he reports he is tired today. Agrees to PT. Patient performed standing dynamic and static balance activities at counter. Side stepping 5'x10 reps, standing feet together with head turns, head nods, reaching x 10 reps each. Semi-Tandem stance x 30 sec each. UE support as needed to maintain balance, supervision. Patient ambulated 20 feet with head turns, no LOB. Improved balance this visit compared to yesterday. Patient will continued to benefit from skilled PT in outpatient setting to work on balance and fall prevention.        Follow Up Recommendations  Outpatient PT     Equipment Recommendations  Rolling walker with 5" wheels    Recommendations for Other Services       Precautions / Restrictions Precautions Precautions: Fall Precaution Comments: mod fall Restrictions Weight Bearing Restrictions: No    Mobility  Bed Mobility Overal bed mobility: Modified Independent             General bed mobility comments: increased effort, use of rails.  Transfers Overall transfer level: Independent   Transfers: Sit to/from Stand Sit to Stand: Supervision         General transfer comment: improved steadiness this visit. No overt LOB  Ambulation/Gait Ambulation/Gait assistance: Supervision;Min guard Gait Distance (Feet): 45  Feet Assistive device: None Gait Pattern/deviations: WFL(Within Functional Limits) Gait velocity: appeared to be about baseline   General Gait Details: improved balance this visit, no LOB   Stairs             Wheelchair Mobility    Modified Rankin (Stroke Patients Only)       Balance   Sitting-balance support: Feet supported Sitting balance-Leahy Scale: Good     Standing balance support: No upper extremity supported Standing balance-Leahy Scale: Good Standing balance comment: able to maintain static standing and perform higher level balance activities this visit             High level balance activites: Side stepping;Head turns;Other (comment)(static standing:semi tandem stance, feet together with head turns, head nods, reaching) High Level Balance Comments: Improved ability with higher level activities, occasional lob able to touch counter for support as needed            Cognition Arousal/Alertness: Lethargic Behavior During Therapy: WFL for tasks assessed/performed Overall Cognitive Status: Within Functional Limits for tasks assessed                                        Exercises      General Comments        Pertinent Vitals/Pain Pain Assessment: No/denies pain    Home Living  Prior Function            PT Goals (current goals can now be found in the care plan section) Acute Rehab PT Goals Patient Stated Goal: get rid of this eye issue and get home PT Goal Formulation: With patient Time For Goal Achievement: 10/27/18 Potential to Achieve Goals: Good Progress towards PT goals: Progressing toward goals    Frequency    Min 3X/week      PT Plan Current plan remains appropriate    Co-evaluation              AM-PAC PT "6 Clicks" Mobility   Outcome Measure  Help needed turning from your back to your side while in a flat bed without using bedrails?: A Little Help needed moving  from lying on your back to sitting on the side of a flat bed without using bedrails?: A Little Help needed moving to and from a bed to a chair (including a wheelchair)?: A Little Help needed standing up from a chair using your arms (e.g., wheelchair or bedside chair)?: A Little Help needed to walk in hospital room?: A Little Help needed climbing 3-5 steps with a railing? : A Little 6 Click Score: 18    End of Session Equipment Utilized During Treatment: Gait belt Activity Tolerance: Patient tolerated treatment well Patient left: in chair;with call bell/phone within reach Nurse Communication: Mobility status PT Visit Diagnosis: Unsteadiness on feet (R26.81);Difficulty in walking, not elsewhere classified (R26.2)     Time: 1130-1200 PT Time Calculation (min) (ACUTE ONLY): 30 min  Charges:  $Gait Training: 8-22 mins $Therapeutic Exercise: 8-22 mins                     Jewett Mcgann, PT, GCS 10/23/18,12:14 PM

## 2018-10-23 NOTE — Discharge Summary (Signed)
[facility] [place] NAME: Cameron Foster    MR#:  [id number]  DATE OF BIRTH:  Sep 27, 1940  DATE OF ADMISSION:  10/20/2018 ADMITTING PHYSICIAN: Christel Mormon, MD  DATE OF DISCHARGE: 10/23/2018   PRIMARY CARE PHYSICIAN: [name], MD    ADMISSION DIAGNOSIS:  Double vision  DISCHARGE DIAGNOSIS:  Active Problems: Double vision   SECONDARY DIAGNOSIS:   Past Medical History:  Diagnosis Date  . Alcoholism ([place])   . Arthritis   . Atrial fibrillation ([place])    one episode  . Colon polyp   . Depression    Son died 07/05/2014  . Diverticulitis   . Diverticulosis 30 years  . Dysrhythmia    At Fib 1995  . GERD (gastroesophageal reflux disease)   . Hyperlipidemia   . Hypertension   . Irritable bowel syndrome   . Prostate cancer ([place]) 07/05/10   treated with radiation therapy. Prostate  . Vasovagal syncope     HOSPITAL COURSE:   1.Diplopia; intermittent -With recent subarachnoid hemorrhage resulting in transfer to [facility] for evaluation and ultimately patient's disposition home. -CT brain is negative for acute findings. -Neurology, Dr. Doy Mince, has been consulted for diplopia  -Continue neurologic checks every 2 hours -Patient reported having complete stroke work-up including MRI of the brain done at [facility] recently.   -CT scan of the head without contrast with no acute findings. CT Angie of head with and without contrast with no large vessel occlusion , aneurysm or vascular malformation. No significant stenosis in anterior circulation. Mild to moderate stenosis in posterior circulation. At the time of my evaluation this morning, double vision appears resolved. Appears to be intermittent. Followed-up on neurology evaluation and recommendations. MRI brain without any acute findings. Checked for ESR, TSH and free T4. Neurologist ordered myasthenia gravis work-up. Test sent, result is pending, Follow in  Neuro clinic. Also suggested to get ophthalmology evaluation.  Seen by ophthalmologist who suggested to send CRP to rule out temporal arteritis.  CRP is sent, patient is feeling symptomatically better so we will discharge home and advised to follow-up in ophthalmology clinic in 1 week.  2.status post subarachnoid hemorrhage; recently managed at [place] head without contrast last night with no acute findings  Follow-up neurology input EEG is done and does not show any seizure-like activity.  3. Hypokalemia Replaced  4. History of EtOH -CIWA protocol  SCDs for DVT prophylaxis    DISCHARGE CONDITIONS:   Stable  CONSULTS OBTAINED:  Treatment Team:  Catarina Hartshorn, MD  DRUG ALLERGIES:   Allergies  Allergen Reactions  . Augmentin [Amoxicillin-Pot Clavulanate] Other (See Comments)    Abdominal upset Has patient had a PCN reaction causing immediate rash, facial/tongue/throat swelling, SOB or lightheadedness with hypotension: Unknown Has patient had a PCN reaction causing severe rash involving mucus membranes or skin necrosis: Unknown Has patient had a PCN reaction that required hospitalization: Unknown Has patient had a PCN reaction occurring within the last 10 years: Unknown If all of the above answers are "NO", then may proceed with Cephalosporin use.   . Formaldehyde Rash  . Tape Rash    Pt states he is not allergic to tape.    DISCHARGE MEDICATIONS:   Allergies as of 10/23/2018      Reactions   Augmentin [amoxicillin-pot Clavulanate] Other (See Comments)   Abdominal upset Has patient had a PCN reaction causing immediate rash, facial/tongue/throat swelling, SOB or lightheadedness with  hypotension: Unknown Has patient had a PCN reaction causing severe rash involving mucus membranes or skin necrosis: Unknown Has patient had a PCN reaction that required hospitalization: Unknown Has patient had a PCN reaction occurring within the last 10 years:  Unknown If all of the above answers are "NO", then may proceed with Cephalosporin use.   Formaldehyde Rash   Tape Rash   Pt states he is not allergic to tape.      Medication List    STOP taking these medications   doxycycline 100 MG tablet Commonly known as: VIBRA-TABS     TAKE these medications   amLODipine 10 MG tablet Commonly known as: NORVASC Take 1 tablet (10 mg total) by mouth daily for 30 days.   fluticasone 50 MCG/ACT nasal spray Commonly known as: FLONASE Place 1 spray into both nostrils daily as needed for allergies or rhinitis.   furosemide 20 MG tablet Commonly known as: LASIX Take 1 tablet (20 mg total) by mouth daily.   hydrocortisone 2.5 % cream APPLY CREAM TO AFFECTED AREA TWICE DAILY AS NEEDED FOR RASH   lisinopril 10 MG tablet Commonly known as: ZESTRIL TAKE 1 TABLET BY MOUTH EVERY DAY   liver oil-zinc oxide 40 % ointment Commonly known as: DESITIN Apply 1 application topically daily as needed for irritation.   meloxicam 15 MG tablet Commonly known as: MOBIC Take 15 mg by mouth daily.   multivitamin with minerals Tabs tablet Take 1 tablet by mouth daily. Start taking on: October 24, 2018   nebivolol 10 MG tablet Commonly known as: Bystolic Take 1 tablet (10 mg total) by mouth daily.   omeprazole 20 MG capsule Commonly known as: PRILOSEC Take 20 mg by mouth daily as needed (heartburn).   sertraline 100 MG tablet Commonly known as: ZOLOFT Take 1 tablet (100 mg total) by mouth daily.   Stool Softener 100 MG capsule Generic drug: docusate sodium Take 100 mg by mouth 2 (two) times daily.        DISCHARGE INSTRUCTIONS:   Follow with neurology and ophthalmology clinic.  If you experience worsening of your admission symptoms, develop shortness of breath, life threatening emergency, suicidal or homicidal thoughts you must seek medical attention immediately by calling 911 or calling your MD immediately  if symptoms less severe.  You Must  read complete instructions/literature along with all the possible adverse reactions/side effects for all the Medicines you take and that have been prescribed to you. Take any new Medicines after you have completely understood and accept all the possible adverse reactions/side effects.   Please note  You were cared for by a hospitalist during your hospital stay. If you have any questions about your discharge medications or the care you received while you were in the hospital after you are discharged, you can call the unit and asked to speak with the hospitalist on call if the hospitalist that took care of you is not available. Once you are discharged, your primary care physician will handle any further medical issues. Please note that NO REFILLS for any discharge medications will be authorized once you are discharged, as it is imperative that you return to your primary care physician (or establish a relationship with a primary care physician if you do not have one) for your aftercare needs so that they can reassess your need for medications and monitor your lab values.    Today   CHIEF COMPLAINT:   Chief Complaint  Patient presents with  . Diplopia  HISTORY OF PRESENT ILLNESS:  Cameron Foster  is a 78 y.o. male with a known history of subarachnoid hemorrhage on 10/10/2018 which resulted in patient's transfer to [facility] for further evaluation.  Patient had initially presented at that time to our emergency room with diplopia when looking to the right with a slight unsteady gait.  Patient tells me symptoms resolved completely after being discharged from [facility].  However, he woke 6 days ago on Sunday morning with severe frontal and maxillofacial pain.  He was seen in the emergency room and treated with analgesic with resolution of the pain at that time.  Patient tells me he was experiencing no neurologic deficits at that time.  However, this a.m. when patient woke he noted to again be having diplopia when  looking to the right.  He has noted no headache or facial pain.  He denies blurred vision.  He denies slurred speech or facial drooping.  No unilateral weakness.  No gait disturbance.  He denies shortness of breath or chest pain.  He denies palpitations.  CT brain was completed demonstrating no acute intracranial abnormality.  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  Labs demonstrate potassium 3.2.  He has been admitted to the hospitalist service for further management.   VITAL SIGNS:  Blood pressure (!) 151/88, pulse 62, temperature 98.2 F (36.8 C), temperature source Oral, resp. rate 20, height 5' 9"  (1.753 m), SpO2 96 %.  I/O:    Intake/Output Summary (Last 24 hours) at 10/23/2018 1307 Last data filed at 10/23/2018 1000 Gross per 24 hour  Intake -  Output 1050 ml  Net -1050 ml    PHYSICAL EXAMINATION:  GENERAL:  78 y.o.-year-old patient lying in the bed with no acute distress.  EYES: Pupils equal, round, reactive to light and accommodation. No scleral icterus. Extraocular muscles intact.  HEENT: Head atraumatic, normocephalic. Oropharynx and nasopharynx clear.  NECK:  Supple, no jugular venous distention. No thyroid enlargement, no tenderness.  LUNGS: Normal breath sounds bilaterally, no wheezing, rales,rhonchi or crepitation. No use of accessory muscles of respiration.  CARDIOVASCULAR: S1, S2 normal. No murmurs, rubs, or gallops.  ABDOMEN: Soft, non-tender, non-distended. Bowel sounds present. No organomegaly or mass.  EXTREMITIES: No pedal edema, cyanosis, or clubbing.  NEUROLOGIC: Cranial nerves II through XII are intact. Muscle strength 5/5 in all extremities. Sensation intact. Gait not checked.  PSYCHIATRIC: The patient is alert and oriented x 3.  SKIN: No obvious rash, lesion, or ulcer.   DATA REVIEW:   CBC Recent Labs  Lab 10/21/18 0459  WBC 5.8  HGB 11.0*  HCT 34.6*  PLT 250    Chemistries  Recent Labs  Lab 10/22/18 0541   NA 137  K 3.3*  CL 103  CO2 27  GLUCOSE 99  BUN 13  CREATININE 1.18  CALCIUM 9.0  MG 2.3    Cardiac Enzymes No results for input(s): TROPONINI in the last 168 hours.  Microbiology Results  Results for orders placed or performed during the hospital encounter of 10/20/18  Novel Coronavirus,NAA,(SEND-OUT TO REF LAB - TAT 24-48 hrs); Hosp Order     Status: None   Collection Time: 10/21/18 12:28 AM   Specimen: Nasopharyngeal Swab; Respiratory  Result Value Ref Range Status   SARS-CoV-2, NAA NOT DETECTED NOT DETECTED Final    Comment: (NOTE) This test was developed and its performance characteristics determined by [employer]. This test has not been FDA cleared or approved. This test has been authorized by FDA under an Emergency  Use Authorization (EUA). This test is only authorized for the duration of time the declaration that circumstances exist justifying the authorization of the emergency use of in vitro diagnostic tests for detection of SARS-CoV-2 virus and/or diagnosis of COVID-19 infection under section 564(b)(1) of the Act, 21 U.S.C. 390ZES-9(Q)(3), unless the authorization is terminated or revoked sooner. When diagnostic testing is negative, the possibility of a false negative result should be considered in the context of a patient's recent exposures and the presence of clinical signs and symptoms consistent with COVID-19. An individual without symptoms of COVID-19 and who is not shedding SARS-CoV-2 virus would expect to have a negative (not detected) result in this assay. Performed  At: [facility] [address] [place], [place] [id number] Rush Farmer MD FH:[id number]    [place]  Final    Comment: Performed at [facility], [address] [place], [place] [address]  MRSA PCR Screening     Status: None   Collection Time: 10/21/18  2:41 AM   Specimen: Nasopharyngeal  Result Value Ref Range Status   MRSA by  PCR NEGATIVE NEGATIVE Final    Comment:        The GeneXpert MRSA Assay (FDA approved for NASAL specimens only), is one component of a comprehensive MRSA colonization surveillance program. It is not intended to diagnose MRSA infection nor to guide or monitor treatment for MRSA infections. Performed at [facility], [address] [place],  [address]     RADIOLOGY:  Mr Jeri Cos LX Contrast  Result Date: 10/21/2018 CLINICAL DATA:  Recent diagnosis of stroke. Worsening double vision. EXAM: MRI HEAD WITHOUT AND WITH CONTRAST TECHNIQUE: Multiplanar, multiecho pulse sequences of the brain and surrounding structures were obtained without and with intravenous contrast. CONTRAST:  9 cc Gadavist COMPARISON:  CT studies 10/20/2018 and 10/21/2018. Other prior CT examinations from June of this year. FINDINGS: Brain: Diffusion imaging does not show any acute or subacute infarction. There chronic small-vessel ischemic changes of the pons. There are numerous old small vessel cerebellar infarctions affecting both cerebellar hemispheres. Cerebral hemispheres show old infarctions affecting the thalami in scattered within the deep and subcortical Barba matter. No large vessel territory infarction. No mass lesion. No intra-axial hemorrhage. Few punctate foci of hemosiderin deposition associated with some of the old small vessel infarctions. Previously seen extra-axial hemorrhage in the perimesencephalic cistern/foramen magnum on the right in late June is no longer visible. Vascular: Major vessels at the base of the brain show flow. Right vertebral artery appears to terminate in PICA. Skull and upper cervical spine: Negative Sinuses/Orbits: Clear/normal Other: None IMPRESSION: No acute or subacute finding. Extensive old infarctions throughout the brain as outlined above. Resolution of previously seen subarachnoid hemorrhage at base of the brain on the right in June. Electronically Signed   By: Nelson Chimes M.D.   On: 10/21/2018 15:01    EKG:   Orders placed or performed during the hospital encounter of 10/20/18  . ED EKG  . ED EKG      Management plans discussed with the patient, family and they are in agreement.  CODE STATUS:     Code Status Orders  (From admission, onward)         Start     Ordered   10/20/18 2346  Full code  Continuous     10/20/18 2345        Code Status History    Date Active Date Inactive Code Status Order ID Comments User  Context   03/21/2017 1535 03/25/2017 1352 Full Code [id number]  Clayburn Pert, MD Inpatient   12/05/2016 1721 12/14/2016 1923 Full Code [id number]  Clayburn Pert, MD Inpatient   07/27/2016 1231 07/29/2016 1932 Full Code [id number]  [name], MD Inpatient   Advance Care Planning Activity    Advance Directive Documentation     Most Recent Value  Type of Advance Directive  Healthcare Power of [place], Living will  Pre-existing out of facility DNR order (yellow form or pink MOST form)  -  "MOST" Form in Place?  -      TOTAL TIME TAKING CARE OF THIS PATIENT: 35 minutes.    Vaughan Basta M.D on 10/23/2018 at 1:07 PM  Between 7am to 6pm - Pager - [phone]  After 6pm go to [email or web address] - password [email or web address]  Office  [phone]  CC: Primary care physician; [name], MD   Note: This dictation was prepared with Dragon dictation along with smaller phrase technology. Any transcriptional errors that result from this process are unintentional.

## 2018-10-25 ENCOUNTER — Telehealth: Payer: Self-pay | Admitting: Family Medicine

## 2018-10-25 ENCOUNTER — Other Ambulatory Visit: Payer: Self-pay | Admitting: Family Medicine

## 2018-10-25 MED ORDER — NEBIVOLOL HCL 10 MG PO TABS: 10.0000 mg | ORAL_TABLET | Freq: Every day | ORAL | 2 refills | Status: DC

## 2018-10-25 NOTE — Telephone Encounter (Signed)
Please review. Thanks!  

## 2018-10-25 NOTE — Telephone Encounter (Signed)
Pt needs refill on generic Bystolic 10 mg  [facility]  [place]

## 2018-10-25 NOTE — Telephone Encounter (Signed)
Patient called and said that he went to the pharmacy today to pick up his prescription for nebivolol (BYSTOLIC) 10 MG tablet and that is was $450.00. Patient said that he originally got samples is almost out of them. Patient is requesting a different medication or open to suggestions as to what to do about the cost. Please call patient back at [phone]  Thank you, [facility]

## 2018-10-26 ENCOUNTER — Telehealth: Payer: Self-pay | Admitting: Internal Medicine

## 2018-10-26 ENCOUNTER — Other Ambulatory Visit: Payer: Self-pay | Admitting: Family Medicine

## 2018-10-26 LAB — ACETYLCHOLINE RECEPTOR AB, ALL
Acety choline binding ab: <0.03 nmol/L (ref 0.00–0.24)
Acetylchol Block Ab: 19 % (ref 0–25)
Acetylcholine Modulat Ab: <12 % (ref 0–20)

## 2018-10-26 LAB — VITAMIN B1: Vitamin B1 (Thiamine): 140.4 nmol/L (ref 66.5–200.0)

## 2018-10-26 MED ORDER — METOPROLOL SUCCINATE ER 50 MG PO TB24: 50.0000 mg | ORAL_TABLET | Freq: Every day | ORAL | 3 refills | Status: DC

## 2018-10-26 MED ORDER — OMEPRAZOLE 20 MG PO CPDR: 20.0000 mg | DELAYED_RELEASE_CAPSULE | Freq: Every day | ORAL | 5 refills | Status: DC | PRN

## 2018-10-26 NOTE — Telephone Encounter (Signed)
Pt c/o medication issue:  1. Name of Medication: Bystolic  2. How are you currently taking this medication (dosage and times per day)? 1 10 mg tablet/daily  3. Are you having a reaction (difficulty breathing--STAT)? no  4. What is your medication issue? Patient unable to afford medication and wants to know if it is neccessary for him or not. He has been using samples but about to run out. Please advise

## 2018-10-26 NOTE — Telephone Encounter (Signed)
L.O.V. 10/16/2018, please advise.

## 2018-10-26 NOTE — Telephone Encounter (Signed)
Will send in Metoprolol instead.  Can take 30m of Toprol XL daily instead of Bystolic. This is a medication in the same class that is much more affordable.

## 2018-10-26 NOTE — Telephone Encounter (Signed)
Spoke with the pt. Pt was last seen in Jan 2020. Dr.End d/c metoprolol and started the pt on Bystolic 59YV daily. Pt was provided sampled at the o/v. Pt called back to say that he could not afford bystolic and asked if Dr. Saunders Revel could recommend a cheaper alternative Pt was to f/u in 1 month. Pt was lost to f/u.  Pt sts that has not taken Bystolic since Jan. He was recently d/c from the hospital and bystolic was listed on his medication d/c list. Pt wants to know if he really needs to be on the medication. Adv the pt that since he is past due for f/u with cardiology I would recommend he be seen. Pt verbalized understanding.  Appt scheduled with Christell Faith, PA on 10/29/18 @ 1:30pm. Pt adv to bring all of his current medication bottles with him to his appt. Pt adv that he will need to enter through [place] and wear a face mask. Adv the pt arrive a bit early to get through the screening desk and down to his appt on time. COVID screening done.        COVID-19 Pre-Screening Questions:  . In the past 7 to 10 days have you had a cough,  shortness of breath, headache, congestion, fever (100 or greater) body aches, chills, sore throat, or sudden loss of taste or sense of smell? No . Have you been around anyone with known Covid 19. No . Have you been around anyone who is awaiting Covid 19 test results in the past 7 to 10 days? No Have you been around anyone who has been exposed to Covid 19, or has mentioned symptoms of Covid 19 within the past 7 to 10 days? No

## 2018-10-26 NOTE — Telephone Encounter (Signed)
[facility] ref[facility] request for the following medications:  omeprazole (PRILOSEC) 20 MG capsule  I don't see that anyone from this office has prescribed this medication for pt. Please advise. Thanks TNP

## 2018-10-26 NOTE — Telephone Encounter (Signed)
Pt called back today to check on the his question from yesterday regarding his medication.  Pt asking to be called back today asap.  Thanks, [employer]

## 2018-10-28 NOTE — Progress Notes (Deleted)
Cardiology Office Note Date:  10/28/2018  Patient ID:  Cameron Foster., DOB 1940-04-21, MRN [id number] PCP:  [name], MD  Cardiologist:  Dr. Saunders Revel, MD  ***refresh   Chief Complaint: Medication discussion  History of Present Illness: Cameron Foster. is a 78 y.o. male with history of lone A. fib in 1995, vasovagal syncope while living in [place], recent subarachnoid hemorrhage in 09/2018, hypertension, hyperlipidemia, prostate cancer, alcohol abuse, and right knee surgery complicated by bowel problems requiring ostomy with subsequent takedown who presents for further discussion as to if he needs to remain on Bystolic.  Patient previously had a syncopal episode while living in [place] that was precipitated by palpitations.  Work-up was reportedly unrevealing and diagnosis of vasovagal syncope was made.  He reported cardiac catheterization in [place] was unrevealing.  Patient was previously evaluated by Dr. Fletcher Anon for chronic shortness of breath with echo and myocardial perfusion stress test being unrevealing at that time.  He was subsequently lost to follow-up.  He was most recently evaluated by our office on 05/02/2022 continued chronic shortness of breath.  In the setting of the patient's right knee surgery with bowel problems requiring ostomy over the prior 2 years the patient was noted to be living a relatively sedentary lifestyle.  Patient felt like some of his symptoms of chronic shortness of breath may have been due to deconditioning and weight gain.  He reported walking as little as 30 to 40 yards made him feel very short of breath.  Work-up performed by pulmonology in 03/2018 including PFTs and echo were unrevealing other than trivial AI, mild MR, and questionable PFO.  It was not felt that these findings explain the patient's symptoms.  Repeating of stress testing was discussed though deferred at that time.  Deconditioning and medication side effects were felt to possibly be  contributing to the patient's symptoms.  In this setting his metoprolol was switched to Bystolic and Lasix 20 mg daily was started for potential component of diastolic heart failure.  However, it appears the patient cannot afford Bystolic.  More recently, he was admitted to outside hospital in 09/2018 for subarachnoid hemorrhage and hypertension.  CT head and CTA brain showed acute extra-axial hemorrhage in the posterior fossa along the right aspect of pons and medulla, negative for intracranial aneurysm, vascular malformation, or LVO.  He was transferred to neuro ICU for further management.  Repeat CT head on 6/25 showed stable subarachnoid hemorrhage in the right posterior fossa.  Repeat CT head on 6/28 at [facility] ED showed decreased hemorrhage volume with no acute abnormalities.  Patient was most recently admitted to [facility] in early 10/2018 with diplopia with repeat CT head being nonacute.  CTA of the head was without large vessel occlusion, aneurysm, or vascular malformation with no significant stenosis in the anterior circulation with mild to moderate stenosis in the posterior circulation.  MRI brain was without acute findings.  EEG was unrevealing.  Patient was consulted on by neurology and ophthalmology with work-up for myasthenia gravis and temporal arteritis being recommended.  ***  Labs: 10/2018-CRP 1.0, magnesium 2.3, potassium 3.3, serum creatinine 1.18, TSH 5.605, free T4 normal, A1c 5.3, sed rate 39, acetylcholine receptor binding antibody negative, vitamin B1 140.4, WBC 5.8, Hgb 11.0, PLT 250 09/2018- albumin 4.0, AST/ALT normal  Past Medical History:  Diagnosis Date   Alcoholism ([place])    Arthritis    Atrial fibrillation ([place])    one episode   Colon polyp  Depression    Son died 2016   Diverticulitis    Diverticulosis 30 years   Dysrhythmia    At Fib 1995   GERD (gastroesophageal reflux disease)    Hyperlipidemia    Hypertension    Irritable bowel syndrome    Prostate  cancer ([place]) 2012   treated with radiation therapy. Prostate   Vasovagal syncope     Past Surgical History:  Procedure Laterality Date   CARDIAC CATHETERIZATION  1998   [place] no stents   CATARACT EXTRACTION, BILATERAL     COLON RESECTION SIGMOID N/A 12/07/2016   Procedure: COLON RESECTION SIGMOID;  Surgeon: Clayburn Pert, MD;  Location: [facility] ORS;  Service: General;  Laterality: N/A;   COLON SURGERY  11/2016   Colostomy   COLONOSCOPY  2016   COLONOSCOPY WITH PROPOFOL N/A 05/30/2016   Procedure: COLONOSCOPY WITH PROPOFOL;  Surgeon: Lollie Sails, MD;  Location: [facility] ENDOSCOPY;  Service: Endoscopy;  Laterality: N/A;   COLOSTOMY Left 12/07/2016   Procedure: COLOSTOMY;  Surgeon: Clayburn Pert, MD;  Location: [facility] ORS;  Service: General;  Laterality: Left;   COLOSTOMY REVERSAL N/A 03/21/2017   Procedure: COLOSTOMY REVERSAL;  Surgeon: Clayburn Pert, MD;  Location: [facility] ORS;  Service: General;  Laterality: N/A;   COLOSTOMY TAKEDOWN N/A 03/21/2017   Procedure: LAPAROSCOPIC COLOSTOMY TAKEDOWN;  Surgeon: Clayburn Pert, MD;  Location: [facility] ORS;  Service: General;  Laterality: N/A;   CYSTOSCOPY WITH STENT PLACEMENT Bilateral 03/21/2017   Procedure: CYSTOSCOPY WITH LIGHTED STENT PLACEMENT;  Surgeon: Abbie Sons, MD;  Location: [facility] ORS;  Service: Urology;  Laterality: Bilateral;   ESOPHAGOGASTRODUODENOSCOPY     ESOPHAGOGASTRODUODENOSCOPY N/A 05/30/2016   Procedure: ESOPHAGOGASTRODUODENOSCOPY (EGD);  Surgeon: Lollie Sails, MD;  Location: [facility] ENDOSCOPY;  Service: Endoscopy;  Laterality: N/A;   EYE SURGERY     cataracts   FLEXIBLE SIGMOIDOSCOPY N/A 03/21/2017   Procedure: FLEXIBLE SIGMOIDOSCOPY;  Surgeon: Clayburn Pert, MD;  Location: [facility] ORS;  Service: General;  Laterality: N/A;   FRACTURE SURGERY Bilateral    right arm and left wrist   INCISION AND DRAINAGE ABSCESS N/A 12/07/2016   Procedure: DRAINAGE  OF INTRA ABDOMINAL ABSCESS;  Surgeon: Clayburn Pert, MD;  Location: [facility] ORS;  Service: General;  Laterality: N/A;   KNEE ARTHROSCOPY     LAPAROTOMY N/A 12/07/2016   Procedure: EXPLORATORY LAPAROTOMY;  Surgeon: Clayburn Pert, MD;  Location: [facility] ORS;  Service: General;  Laterality: N/A;   PROSTATE SURGERY     Microwave therapy   TONSILLECTOMY     TOTAL KNEE ARTHROPLASTY Right 07/27/2016   Procedure: TOTAL KNEE ARTHROPLASTY;  Surgeon: [name], MD;  Location: [facility] ORS;  Service: Orthopedics;  Laterality: Right;  Dr. Erlene Quan had to place Urinary catheter due to prostate cancer history.  Using flexible scope.    No outpatient medications have been marked as taking for the 10/29/18 encounter (Appointment) with Rise Mu, PA-C.    Allergies:   Augmentin [amoxicillin-pot clavulanate], Formaldehyde, and Tape   Social History:  The patient  reports that he quit smoking about 30 years ago. His smoking use included cigarettes. He has a 27.00 pack-year smoking history. He has quit using smokeless tobacco.  His smokeless tobacco use included chew. He reports current alcohol use of about 3.0 standard drinks of alcohol per week. He reports that he does not use drugs.   Family History:  The patient's family history includes Bipolar disorder in his son; Kidney disease in his daughter; Lung cancer in his father; Other in  his mother; Sudden death in his son.  ROS:   ROS   PHYSICAL EXAM: *** VS:  There were no vitals taken for this visit. BMI: There is no height or weight on file to calculate BMI.  Physical Exam   EKG:  Was ordered and interpreted by me today. Shows ***  Recent Labs: 10/10/2018: ALT 10 10/20/2018: TSH 5.605 10/21/2018: Hemoglobin 11.0; Platelets 250 10/22/2018: BUN 13; Creatinine, Ser 1.18; Magnesium 2.3; Potassium 3.3; Sodium 137  05/18/2018: Chol/HDL Ratio 5.0; Cholesterol, Total 213; HDL 43; LDL Calculated 141; Triglycerides 143   Estimated Creatinine Clearance: 58.7 mL/min (by C-G formula based on SCr of 1.18  mg/dL).   Wt Readings from Last 3 Encounters:  10/21/18 210 lb (95.3 kg)  10/14/18 210 lb (95.3 kg)  10/10/18 210 lb (95.3 kg)     Other studies reviewed: 2D-Echo 03/2018: - Left ventricle: The cavity size was normal. Systolic function was   normal. The estimated ejection fraction was in the range of 60%   to 65%. Wall motion was normal; there were no regional wall   motion abnormalities. Left ventricular diastolic function   parameters were normal. - Aortic valve: There was mild regurgitation. - Left atrium: The atrium was mildly dilated. - Right ventricle: Systolic function was normal. - Atrial septum: Suspected small patent foramen ovale by color flow   Doppler. . - Pulmonary arteries: Systolic pressure was within the normal   range.  ASSESSMENT AND PLAN:  1. ***  Disposition: F/u with Dr. Saunders Revel or an APP in ***.  Current medicines are reviewed at length with the patient today.  The patient did not have any concerns regarding medicines.  Signed, Christell Faith, PA-C 10/28/2018 10:21 AM     [facility] [address] [place], [place] [address] [phone]

## 2018-10-29 ENCOUNTER — Ambulatory Visit: Payer: PRIVATE HEALTH INSURANCE | Admitting: Physician Assistant

## 2018-10-29 ENCOUNTER — Telehealth: Payer: Self-pay | Admitting: Internal Medicine

## 2018-10-29 NOTE — Telephone Encounter (Signed)
Patient calling  Patient had an episode of vertigo earlier today so was unable to make appointment Patient wants to speak with the nurse as soon as possible in regards to medications Please call to discuss

## 2018-10-29 NOTE — Telephone Encounter (Signed)
Spoke with pt was unable to keep today's appt due to episode of vertigo Pt wanted to let Dr End know that PMD restarted his Metoprolol because the Bystoloic was to expensive $ 456.00 for 3 months . Will let Dr End know/cy

## 2018-10-29 NOTE — Telephone Encounter (Signed)
Patient was advise.

## 2018-10-30 ENCOUNTER — Encounter: Payer: Self-pay | Admitting: Family Medicine

## 2018-10-30 DIAGNOSIS — H4921 Sixth [abducent] nerve palsy, right eye: Secondary | ICD-10-CM | POA: Diagnosis not present

## 2018-10-30 NOTE — Telephone Encounter (Signed)
Ok.  Thanks for the update.  [name], MD [facility] Pager: [phone]

## 2018-10-31 ENCOUNTER — Ambulatory Visit: Payer: PRIVATE HEALTH INSURANCE | Admitting: Pulmonary Disease

## 2018-10-31 ENCOUNTER — Telehealth: Payer: Self-pay | Admitting: Family Medicine

## 2018-10-31 NOTE — Telephone Encounter (Signed)
Referral was sent to [facility] for recurrent sinusitis.Christie from their office states that all images show that sinuses are clear. Do you have another diagnosis that can be used ?

## 2018-10-31 NOTE — Telephone Encounter (Signed)
We can just use sinus pressure instead.  I did see clear sinuses on imaging.

## 2018-11-06 NOTE — Progress Notes (Signed)
Follow-up Outpatient Visit Date: 11/07/2018  Primary Care Provider: [name], MD [address] [place] [address]  Chief Complaint: Shortness of breath  HPI:  Cameron Foster is a 78 y.o. year-old male with history of hypertension, hyperlipidemia, single episode of atrial fibrillation in 07-07-93, prostate cancer, and alcohol abuse, who presents for follow-up of chronic shortness of breath.  I met him in January, at which time Cameron Foster reported shortness of breath with walking as little as 30-40 yards.  This was complicated by preceding abdominal and knee surgeries that had limited his mobility.  We agreed to switch metoprolol to nebivolol as well as start furosemide 20 mg daily.  Unfortunately, he has been lost to follow-up due to multiple cancellations.  He presented to the ED with facial pain and double vision.  He was found to have subarachnoid hemorrhage and transferred to [facility] for further management but did not require any re-intervention.  He has been seen twice in the ED at [facility] since then for headaches and double vision, leading to admission on 10/20/2018 due to intermittent diplopia.  Today, Cameron Foster reports stable dyspnea on exertion that has been present for ~2 years  He denies chest pain, palpitations, and lightheadedness.  He has chronic right calf edema, unchanged.  He continues to have vision problems, particularly when looking to the right.  He is scheduled for evaluation with ENT tomorrow.  He wonders if chronic sinus issues could be contributing to his shortness of breath as well as his more recent vision problems.  --------------------------------------------------------------------------------------------------  Past Medical History:  Diagnosis Date  . Alcoholism ([place])   . Arthritis   . Atrial fibrillation ([place])    one episode  . Colon polyp   . Depression    Son died Jul 07, 2014  . Diverticulitis   . Diverticulosis 30 years  . Dysrhythmia    At Fib 1995   . GERD (gastroesophageal reflux disease)   . Hyperlipidemia   . Hypertension   . Irritable bowel syndrome   . Prostate cancer ([place] ) July 07, 2010   treated with radiation therapy. Prostate  . Vasovagal syncope    Past Surgical History:  Procedure Laterality Date  . [place] no stents  . CATARACT EXTRACTION, BILATERAL    . COLON RESECTION SIGMOID N/A 12/07/2016   Procedure: COLON RESECTION SIGMOID;  Surgeon: Clayburn Pert, MD;  Location: [facility] ORS;  Service: General;  Laterality: N/A;  . COLON SURGERY  11/2016   Colostomy  . COLONOSCOPY  2014-07-07  . COLONOSCOPY WITH PROPOFOL N/A 05/30/2016   Procedure: COLONOSCOPY WITH PROPOFOL;  Surgeon: Lollie Sails, MD;  Location: [facility] ENDOSCOPY;  Service: oscopy;  Laterality: N/A;  . COLOSTOMY Left 12/07/2016   Procedure: COLOSTOMY;  Surgeon: Clayburn Pert, MD;  Location: [facility] ORS;  Service: General;  Laterality: Left;  . COLOSTOMY REVERSAL N/A 03/21/2017   Procedure: COLOSTOMY REVERSAL;  Surgeon: Clayburn Pert, MD;  Location: [facility] ORS;  Service: General;  Laterality: N/A;  . COLOSTOMY TAKEDOWN N/A 03/21/2017   Procedure: LAPAROSCOPIC COLOSTOMY TAKEDOWN;  Surgeon: Clayburn Pert, MD;  Location: [facility] ORS;  Service: General;  Laterality: N/A;  . CYSTOSCOPY WITH STENT PLACEMENT Bilateral 03/21/2017   Procedure: CYSTOSCOPY WITH LIGHTED STENT PLACEMENT;  Surgeon: Abbie Sons, MD;  Location: [facility] ORS;  Service: Urology;  Laterality: Bilateral;  . ESOPHAGOGASTRODUODENOSCOPY    . ESOPHAGOGASTRODUODENOSCOPY N/A 05/30/2016   Procedure: ESOPHAGOGASTRODUODENOSCOPY (EGD);  Surgeon: Lollie Sails, MD;  Location: [facility] ENDOSCOPY;  Service: Endoscopy;  Laterality: N/A;  . EYE SURGERY     cataracts  . FLEXIBLE SIGMOIDOSCOPY N/A 03/21/2017   Procedure: FLEXIBLE SIGMOIDOSCOPY;  Surgeon: Clayburn Pert, MD;  Location: [facility] ORS;  Service: General;  Laterality: N/A;  . FRACTURE SURGERY Bilateral    right arm and left  wrist  . INCISION AND DRAINAGE ABSCESS N/A 12/07/2016   Procedure: DRAINAGE  OF INTRA ABDOMINAL ABSCESS;  Surgeon: Clayburn Pert, MD;  Location: [facility] ORS;  Service: General;  Laterality: N/A;  . KNEE ARTHROSCOPY    . LAPAROTOMY N/A 12/07/2016   Procedure: EXPLORATORY LAPAROTOMY;  Surgeon: Clayburn Pert, MD;  Location: [facility] ORS;  Service: General;  Laterality: N/A;  . PROSTATE SURGERY     Microwave therapy  . TONSILLECTOMY    . TOTAL KNEE ARTHROPLASTY Right 07/27/2016   Procedure: TOTAL KNEE ARTHROPLASTY;  Surgeon: [name], MD;  Location: [facility] ORS;  Service: Orthopedics;  Laterality: Right;  Dr. Erlene Quan had to place Urinary catheter due to prostate cancer history.  Using flexible scope.    Current Meds  Medication Sig  . amLODipine (NORVASC) 10 MG tablet Take 1 tablet (10 mg total) by mouth daily for 30 days.  Marland Kitchen docusate sodium (STOOL SOFTENER) 100 MG capsule Take 100 mg by mouth 2 (two) times daily.  . fluticasone (FLONASE) 50 MCG/ACT nasal spray Place 1 spray into both nostrils daily as needed for allergies or rhinitis.  . furosemide (LASIX) 20 MG tablet Take 1 tablet (20 mg total) by mouth daily.  . hydrocortisone 2.5 % cream APPLY CREAM TO AFFECTED AREA TWICE DAILY AS NEEDED FOR RASH  . lisinopril (PRINIVIL,ZESTRIL) 10 MG tablet TAKE 1 TABLET BY MOUTH EVERY DAY  . liver oil-zinc oxide (DESITIN) 40 % ointment Apply 1 application topically daily as needed for irritation.  . meloxicam (MOBIC) 15 MG tablet Take 15 mg by mouth daily.   . metoprolol succinate (TOPROL-XL) 50 MG 24 hr tablet Take 1 tablet (50 mg total) by mouth daily. Take with or immediately following a meal.  . Multiple Vitamin (MULTIVITAMIN WITH MINERALS) TABS tablet Take 1 tablet by mouth daily.  Marland Kitchen omeprazole (PRILOSEC) 20 MG capsule Take 1 capsule (20 mg total) by mouth daily as needed (heartburn).  . sertraline (ZOLOFT) 100 MG tablet Take 1 tablet (100 mg total) by mouth daily.    Allergies: Augmentin  [amoxicillin-pot clavulanate], Formaldehyde, and Tape  Social History   Tobacco Use  . Smoking status: Former Smoker    Packs/day: 1.00    Years: 27.00    Pack years: 27.00    Types: Cigarettes    Quit date: 04/18/1988    Years since quitting: 30.5  . Smokeless tobacco: Former Systems developer    Types: Chew  Substance Use Topics  . Alcohol use: Yes    Alcohol/week: 3.0 standard drinks    Types: 3 Standard drinks or equivalent per week    Comment: Occasional- Former Heavy ETOH Use  . Drug use: No    Family History  Problem Relation Age of Onset  . Lung cancer Father        smoker  . Other Mother   . Sudden death Son        due to "Blood clots"  . Bipolar disorder Son   . Kidney disease Daughter        congenital one small kidney  . Prostate cancer Neg Hx   . Bladder Cancer Neg Hx     Review of Systems: A 12-system review of systems  was performed and was negative except as noted in the HPI.  --------------------------------------------------------------------------------------------------  Physical Exam: BP 112/62 (BP Location: Left Arm, Patient Position: Sitting, Cuff Size: Normal)   Pulse 73   Ht 5' 9"  (1.753 m)   Wt 211 lb 8 oz (95.9 kg)   BMI 31.23 kg/m   General:  NAD HEENT: No conjunctival pallor or scleral icterus. Face mask in place. Neck: Supple without lymphadenopathy, thyromegaly, JVD, or HJR.  Lungs: Normal work of breathing. Clear to auscultation bilaterally without wheezes or crackles. Heart: Regular rate and rhythm without murmurs, rubs, or gallops. Non-displaced PMI. Abd: Bowel sounds present. Soft, NT/ND without hepatosplenomegaly Ext: Trace right calf edema.  EKG:  NSR with LAFB and LVH.  ST/T changes most consistent with abnormal repolarization.  ST/T changes in lateral precordial leads are more pronounced than on prior tracing from 05/02/2018.  Lab Results  Component Value Date   WBC 5.8 10/21/2018   HGB 11.0 (L) 10/21/2018   HCT 34.6 (L) 10/21/2018    MCV 86.7 10/21/2018   PLT 250 10/21/2018    Lab Results  Component Value Date   NA 137 10/22/2018   K 3.3 (L) 10/22/2018   CL 103 10/22/2018   CO2 27 10/22/2018   BUN 13 10/22/2018   CREATININE 1.18 10/22/2018   GLUCOSE 99 10/22/2018   ALT 10 10/10/2018    Lab Results  Component Value Date   CHOL 213 (H) 05/18/2018   HDL 43 05/18/2018   LDLCALC 141 (H) 05/18/2018   TRIG 143 05/18/2018   CHOLHDL 5.0 05/18/2018    --------------------------------------------------------------------------------------------------  ASSESSMENT AND PLAN: Dyspnea on exertion: Chronic and unchanged for at least 2 years.  Workup thus far has been unrevealing.  EKG today shows LVH with more pronounced lateral precordial T-wave inversions.  We discussed further testing, including cardiac CTA and R/LHC, given persistent symptoms.  However, in light of recent subdural hematoma and continued vision problems, we have agreed to defer this.  We will readdress this when Cameron Foster returns in 2 months.  No antiplatelet therapy/anticoagulation in the setting of recent intracranial hermorrhage.  Subarachnoid hemorrhage and vision changes: No clear explanation for bleeding despite workup at [facility].  He will continue to follow-up with neurology and neurosurgery.  I favor avoidance of antiplatelet/antithrombotic therapy at this time.  BP is well-controlled.  Hypertension: BP well-controlled.  Continue current medications, including metoprolol, as nebivolol was cost-prohibitive and did not improve the patient's shortness of breath.  Paroxysmal atrial fibrillation: Single remote episode reported by the patient in the setting of electrolyte disturbances.  He has not been on anticoagulation for many years and I favor avoiding it now in the setting of ICH.  Continue current dose of metoprolol.  Follow-up: Return to clinic in 2 months.  [name], MD 11/07/2018 2:42 PM

## 2018-11-07 ENCOUNTER — Other Ambulatory Visit: Payer: Self-pay

## 2018-11-07 ENCOUNTER — Encounter: Payer: Self-pay | Admitting: Internal Medicine

## 2018-11-07 ENCOUNTER — Ambulatory Visit (INDEPENDENT_AMBULATORY_CARE_PROVIDER_SITE_OTHER): Payer: Medicare Other | Admitting: Internal Medicine

## 2018-11-07 VITALS — BP 112/62 | HR 73 | Ht 69.0 in | Wt 211.5 lb

## 2018-11-07 DIAGNOSIS — H539 Unspecified visual disturbance: Secondary | ICD-10-CM

## 2018-11-07 DIAGNOSIS — R0600 Dyspnea, unspecified: Secondary | ICD-10-CM

## 2018-11-07 DIAGNOSIS — I10 Essential (primary) hypertension: Secondary | ICD-10-CM

## 2018-11-07 DIAGNOSIS — R0609 Other forms of dyspnea: Secondary | ICD-10-CM

## 2018-11-07 DIAGNOSIS — I609 Nontraumatic subarachnoid hemorrhage, unspecified: Secondary | ICD-10-CM

## 2018-11-07 DIAGNOSIS — I480 Paroxysmal atrial fibrillation: Secondary | ICD-10-CM | POA: Diagnosis not present

## 2018-11-07 DIAGNOSIS — H532 Diplopia: Secondary | ICD-10-CM | POA: Insufficient documentation

## 2018-11-07 NOTE — Patient Instructions (Addendum)
Medication Instructions:  Your physician recommends that you continue on your current medications as directed. Please refer to the Current Medication list given to you today.  If you need a refill on your cardiac medications before your next appointment, please call your pharmacy.   Lab work: - None ordered.  If you have labs (blood work) drawn today and your tests are completely normal, you will receive your results only by: Marland Kitchen MyChart Message (if you have MyChart) OR . A paper copy in the mail If you have any lab test that is abnormal or we need to change your treatment, we will call you to review the results.  Testing/Procedures: - None ordered.   Follow-Up: At [facility], you and your health needs are our priority.  As part of our continuing mission to provide you with exceptional heart care, we have created designated Provider Care Teams.  These Care Teams include your primary Cardiologist (physician) and Advanced Practice Providers (APPs -  Physician Assistants and Nurse Practitioners) who all work together to provide you with the care you need, when you need it. You will need a follow up appointment in 2 months.  Please call our office 2 months in advance to schedule this appointment.  You may see DR Harrell Gave END or one of the following Advanced Practice Providers on your designated Care Team:   Murray Hodgkins, NP Christell Faith, PA-C . Marrianne Mood, PA-C

## 2018-11-08 DIAGNOSIS — R51 Headache: Secondary | ICD-10-CM | POA: Diagnosis not present

## 2018-11-08 DIAGNOSIS — H6123 Impacted cerumen, bilateral: Secondary | ICD-10-CM | POA: Diagnosis not present

## 2018-11-09 NOTE — Progress Notes (Signed)
Patient: Cameron Foster. Male    DOB: [birth year]   78 y.o.   MRN: [id number] Visit Date: 11/12/2018  Today's Provider: Lavon Paganini, MD   No chief complaint on file.  Subjective:    Virtual Visit via Video Note  I connected with [name] on 11/12/18 at  2:40 PM EDT by a video enabled telemedicine application and verified that I am speaking with the correct person using two identifiers.  Patient location: home Provider location: home office  Persons involved in the visit: patient, provider    I discussed the limitations of evaluation and management by telemedicine and the availability of in person appointments. The patient expressed understanding and agreed to proceed.  HPI  Patient for 6 month follow-up chronic issues.   He has been dealing with diplopia. Had temporal arteritis workup and was reassured by Optho.  Seeing Neurology for myasthenia gravis workup.  He saw ENT and dentist.  Reports BP has been well controlled at all recent doctors visits Was told by ENT to look into trigeminal neuralgia as he continues to have pain over R maxillary sinus   Allergies  Allergen Reactions  . Augmentin [Amoxicillin-Pot Clavulanate] Other (See Comments)    Abdominal upset Has patient had a PCN reaction causing immediate rash, facial/tongue/throat swelling, SOB or lightheadedness with hypotension: Unknown Has patient had a PCN reaction causing severe rash involving mucus membranes or skin necrosis: Unknown Has patient had a PCN reaction that required hospitalization: Unknown Has patient had a PCN reaction occurring within the last 10 years: Unknown If all of the above answers are "NO", then may proceed with Cephalosporin use.   . Formaldehyde Rash  . Tape Rash    Pt states he is not allergic to tape.     Current Outpatient Medications:  .  amLODipine (NORVASC) 10 MG tablet, Take 1 tablet (10 mg total) by mouth daily for 30 days., Disp: 30 tablet, Rfl: 0 .   docusate sodium (STOOL SOFTENER) 100 MG capsule, Take 100 mg by mouth 2 (two) times daily., Disp: , Rfl:  .  fluticasone (FLONASE) 50 MCG/ACT nasal spray, Place 1 spray into both nostrils daily as needed for allergies or rhinitis., Disp: , Rfl:  .  furosemide (LASIX) 20 MG tablet, Take 1 tablet (20 mg total) by mouth daily., Disp: 90 tablet, Rfl: 3 .  hydrocortisone 2.5 % cream, APPLY CREAM TO AFFECTED AREA TWICE DAILY AS NEEDED FOR RASH, Disp: 28 g, Rfl: 11 .  lisinopril (PRINIVIL,ZESTRIL) 10 MG tablet, TAKE 1 TABLET BY MOUTH EVERY DAY, Disp: 90 tablet, Rfl: 1 .  liver oil-zinc oxide (DESITIN) 40 % ointment, Apply 1 application topically daily as needed for irritation., Disp: , Rfl:  .  meloxicam (MOBIC) 15 MG tablet, Take 15 mg by mouth daily. , Disp: , Rfl:  .  metoprolol succinate (TOPROL-XL) 50 MG 24 hr tablet, Take 1 tablet (50 mg total) by mouth daily. Take with or immediately following a meal., Disp: 30 tablet, Rfl: 3 .  Multiple Vitamin (MULTIVITAMIN WITH MINERALS) TABS tablet, Take 1 tablet by mouth daily., Disp: 30 tablet, Rfl: 0 .  omeprazole (PRILOSEC) 20 MG capsule, Take 1 capsule (20 mg total) by mouth daily as needed (heartburn)., Disp: 30 capsule, Rfl: 5 .  sertraline (ZOLOFT) 100 MG tablet, Take 1 tablet (100 mg total) by mouth daily., Disp: 90 tablet, Rfl: 3  Review of Systems  Social History   Tobacco Use  . Smoking  status: Former Smoker    Packs/day: 1.00    Years: 27.00    Pack years: 27.00    Types: Cigarettes    Quit date: 04/18/1988    Years since quitting: 30.5  . Smokeless tobacco: Former Systems developer    Types: Chew  Substance Use Topics  . Alcohol use: Yes    Alcohol/week: 3.0 standard drinks    Types: 3 Standard drinks or equivalent per week    Comment: Occasional- Former Heavy ETOH Use      Objective:   There were no vitals taken for this visit. There were no vitals filed for this visit.   Physical Exam Vitals signs reviewed.  Constitutional:       Appearance: Normal appearance.  HENT:     Head: Normocephalic and atraumatic.  Eyes:     General: No scleral icterus.    Conjunctiva/sclera: Conjunctivae normal.  Pulmonary:     Effort: Pulmonary effort is normal. No respiratory distress.  Neurological:     Mental Status: He is alert and oriented to person, place, and time. Mental status is at baseline.  Psychiatric:        Mood and Affect: Mood normal.        Behavior: Behavior normal.      No results found for any visits on 11/12/18.     Assessment & Plan    I discussed the assessment and treatment plan with the patient. The patient was provided an opportunity to ask questions and all were answered. The patient agreed with the plan and demonstrated an understanding of the instructions.   The patient was advised to call back or seek an in-person evaluation if the symptoms worsen or if the condition fails to improve as anticipated.  Problem List Items Addressed This Visit      Cardiovascular and Mediastinum   Essential hypertension    Well controlled on visit yesterday Continue amlodipine 20m daily, lisinopril, lasix, metoprolol, at current doses Reviewed last metabolic panel        Nervous and Auditory   SAH (subarachnoid hemorrhage) (HCollege    Resolved on last MRI Following with neurology        Other   Class 1 obesity without serious comorbidity with body mass index (BMI) of 31.0 to 31.9 in adult    Discussed importance of healthy weight management Discussed diet and exercise       Mixed hyperlipidemia    Well controlled on lastcheck Continue to monitor annually      Diplopia - Primary    New problem Seeing Optho and Neuro Ruled out Temproal arteritis and myasthenia gravis From notes, appears to have a cranial nerve palsy May also have some trigeminal neuralgia given pain over R maxilla without any signs of snusitis           Return in about 6 months (around 05/15/2019) for chronic follow-up/AWV.    The entirety of the information documented in the History of Present Illness, Review of Systems and Physical Exam were personally obtained by me. Portions of this information were initially documented by JLyndel Pleasure CMA and reviewed by me for thoroughness and accuracy.    Dariane Natzke, ADionne Bucy MD MPH [facility]

## 2018-11-12 ENCOUNTER — Telehealth: Payer: Self-pay

## 2018-11-12 ENCOUNTER — Other Ambulatory Visit: Payer: Self-pay

## 2018-11-12 ENCOUNTER — Telehealth (INDEPENDENT_AMBULATORY_CARE_PROVIDER_SITE_OTHER): Payer: Medicare Other | Admitting: Family Medicine

## 2018-11-12 ENCOUNTER — Encounter: Payer: Self-pay | Admitting: Family Medicine

## 2018-11-12 DIAGNOSIS — H532 Diplopia: Secondary | ICD-10-CM

## 2018-11-12 DIAGNOSIS — Z6831 Body mass index (BMI) 31.0-31.9, adult: Secondary | ICD-10-CM

## 2018-11-12 DIAGNOSIS — E782 Mixed hyperlipidemia: Secondary | ICD-10-CM

## 2018-11-12 DIAGNOSIS — E669 Obesity, unspecified: Secondary | ICD-10-CM | POA: Diagnosis not present

## 2018-11-12 DIAGNOSIS — I609 Nontraumatic subarachnoid hemorrhage, unspecified: Secondary | ICD-10-CM | POA: Diagnosis not present

## 2018-11-12 DIAGNOSIS — I10 Essential (primary) hypertension: Secondary | ICD-10-CM | POA: Diagnosis not present

## 2018-11-12 NOTE — Assessment & Plan Note (Signed)
Well controlled on lastcheck Continue to monitor annually

## 2018-11-12 NOTE — Assessment & Plan Note (Signed)
New problem Seeing Optho and Neuro Ruled out Temproal arteritis and myasthenia gravis From notes, appears to have a cranial nerve palsy May also have some trigeminal neuralgia given pain over R maxilla without any signs of snusitis

## 2018-11-12 NOTE — Telephone Encounter (Signed)
LMTCB to schedule a 6 month AWV and follow-up Chronic issues.

## 2018-11-12 NOTE — Assessment & Plan Note (Signed)
Discussed importance of healthy weight management Discussed diet and exercise  

## 2018-11-12 NOTE — Telephone Encounter (Signed)
fyi

## 2018-11-12 NOTE — Assessment & Plan Note (Signed)
Resolved on last MRI Following with neurology

## 2018-11-12 NOTE — Assessment & Plan Note (Signed)
Well controlled on visit yesterday Continue amlodipine 43m daily, lisinopril, lasix, metoprolol, at current doses Reviewed last metabolic panel

## 2018-11-12 NOTE — Telephone Encounter (Signed)
Pt returned call and scheduled 6 month F/U with Dr. Jacinto Reap. Pt stated he didn't want to schedule AWV with [facility] because he felt that is a waste of time. Thanks TNP

## 2018-11-12 NOTE — Telephone Encounter (Signed)
Ok please make sure he is on my schedule for 40 min appt for AWV/followup then

## 2018-11-13 ENCOUNTER — Ambulatory Visit (INDEPENDENT_AMBULATORY_CARE_PROVIDER_SITE_OTHER): Payer: Medicare Other | Admitting: Pulmonary Disease

## 2018-11-13 ENCOUNTER — Encounter: Payer: Self-pay | Admitting: Pulmonary Disease

## 2018-11-13 ENCOUNTER — Other Ambulatory Visit: Payer: Self-pay | Admitting: Family Medicine

## 2018-11-13 DIAGNOSIS — R0602 Shortness of breath: Secondary | ICD-10-CM

## 2018-11-13 NOTE — Progress Notes (Signed)
PULMONARY OFFICE FOLLOW-UP NOTE  Requesting MD/Service: Cameron Bis, MD Date of initial consultation: 03/29/18 Reason for consultation: Dyspnea  PT PROFILE: 78 y.o. male former smoker (approx 30 P-Y hx, quit 1990) with with no significant past pulmonary history referred for evaluation of progressive dyspnea over approximately 1 year's duration  DATA: 10/05/15 Echocardiogram: LVEF 50-55%. MIld concentric LVH. LA mildly dilated. Mild MR. RVSP estimate within normal range 08/19/16 RLE venous US: No evidence of DVT within the right lower extremity. Fluid collection on the medial side of the right calf measuring 11.2 x 1.4 x 3.4 cm likely reflecting a a liquefying hematoma 06/15/16 CT chest: (After fall) Mildly displaced left lateral seventh and eighth rib fractures. No pulmonary or cardiac pathology  03/29/18 Echocardiogram: LVEF 60-65%. LA mildly dilated. R side normal. RVSP estimate normal 04/03/18 PFTs: Spirometry normal, lung volumes normal, DLCO normal  Virtual Visit via Telephone Note I connected with Cameron Foster. on 11/13/18 at  1:45 PM EDT by telephone and verified that I am speaking with the correct person using two identifiers. I discussed the limitations, risks, security and privacy concerns of performing an evaluation and management service by telephone and the availability of in person appointments. I also discussed with the patient that there may be a patient responsible charge related to this service. The patient expressed understanding and agreed to proceed.    INTERVAL: Last visit 04/05/18. No major pulmonary events. Recent hospitalization @ [facility] for visual change with MRI changes. No definite diagnosis was rendered. Still with double vision when he looks to right. He is scheduled to see Neurologist tomorrow  SUBJ:  This encounter was performed remotely (via telephone).  He indicates that our office called him to schedule this appointment.  Presently, he has no respiratory  problems.  He has no significant exertional dyspnea.  He denies CP, fever, purulent sputum, hemoptysis, LE edema and calf tenderness.   There were no vitals filed for this visit.   EXAM:  Due to the remote nature of this encounter, no physical examination could be performed   DATA:   BMP Latest Ref Rng & Units 10/22/2018 10/21/2018 10/20/2018  Glucose 70 - 99 mg/dL 99 94 106(H)  BUN 8 - 23 mg/dL 13 9 12   Creatinine 0.61 - 1.24 mg/dL 1.18 1.02 1.25(H)  Sodium 135 - 145 mmol/L 137 136 136  Potassium 3.5 - 5.1 mmol/L 3.3(L) 3.5 3.2(L)  Chloride 98 - 111 mmol/L 103 106 103  CO2 22 - 32 mmol/L 27 25 23   Calcium 8.9 - 10.3 mg/dL 9.0 8.9 8.8(L)    CBC Latest Ref Rng & Units 10/21/2018 10/20/2018 10/10/2018  WBC 4.0 - 10.5 K/uL 5.8 7.0 6.9  Hemoglobin 13.0 - 17.0 g/dL 11.0(L) 11.4(L) 13.0  Hematocrit 39.0 - 52.0 % 34.6(L) 35.1(L) 41.0  Platelets 150 - 400 K/uL 250 273 242    CXR:  No new film  I have personally reviewed all chest radiographs reported above including CXRs and CT chest unless otherwise indicated  IMPRESSION:     ICD-10-CM   1. Shortness of breath  R06.02    At the present time, dyspnea is not a major problem.  He indicates that he has bigger issues to work through (as documented above) and I agree with him on this point.  Prior pulmonary evaluation revealed no evidence of underlying lung disease.  He has also undergone an extensive cardiac evaluation (Dr. Saunders Revel)   PLAN:  Follow-up as needed for any breathing, lung or chest problems.  [name], MD PCCM service Mobile [phone] Pager [phone] 11/13/2018 2:30 PM

## 2018-11-13 NOTE — Telephone Encounter (Signed)
Appointment changed to 40 minutes.

## 2018-11-13 NOTE — Patient Instructions (Signed)
Follow-up as needed for any breathing, lung or chest problems.

## 2018-11-14 ENCOUNTER — Other Ambulatory Visit: Payer: Self-pay | Admitting: Family Medicine

## 2018-11-14 DIAGNOSIS — H532 Diplopia: Secondary | ICD-10-CM | POA: Diagnosis not present

## 2018-11-14 DIAGNOSIS — E538 Deficiency of other specified B group vitamins: Secondary | ICD-10-CM | POA: Diagnosis not present

## 2018-11-14 DIAGNOSIS — I10 Essential (primary) hypertension: Secondary | ICD-10-CM

## 2018-11-14 NOTE — Telephone Encounter (Signed)
L.O.V. was on 10/16/2018.

## 2018-11-14 NOTE — Telephone Encounter (Signed)
Pt called for a refill on his    Amlodipine 10 mg  [place]

## 2018-11-15 ENCOUNTER — Encounter: Payer: Self-pay | Admitting: Family Medicine

## 2018-11-19 DIAGNOSIS — E538 Deficiency of other specified B group vitamins: Secondary | ICD-10-CM | POA: Diagnosis not present

## 2018-11-25 ENCOUNTER — Encounter: Payer: Self-pay | Admitting: Family Medicine

## 2018-11-30 DIAGNOSIS — E538 Deficiency of other specified B group vitamins: Secondary | ICD-10-CM | POA: Diagnosis not present

## 2018-12-05 ENCOUNTER — Encounter: Payer: Self-pay | Admitting: Family Medicine

## 2018-12-07 ENCOUNTER — Other Ambulatory Visit: Payer: Self-pay | Admitting: Family Medicine

## 2018-12-07 DIAGNOSIS — E538 Deficiency of other specified B group vitamins: Secondary | ICD-10-CM | POA: Diagnosis not present

## 2018-12-07 DIAGNOSIS — I10 Essential (primary) hypertension: Secondary | ICD-10-CM

## 2018-12-13 NOTE — Progress Notes (Signed)
Open in error

## 2018-12-14 DIAGNOSIS — E538 Deficiency of other specified B group vitamins: Secondary | ICD-10-CM | POA: Diagnosis not present

## 2018-12-21 DIAGNOSIS — E538 Deficiency of other specified B group vitamins: Secondary | ICD-10-CM | POA: Diagnosis not present

## 2018-12-31 DIAGNOSIS — H4921 Sixth [abducent] nerve palsy, right eye: Secondary | ICD-10-CM | POA: Diagnosis not present

## 2019-01-03 DIAGNOSIS — M7061 Trochanteric bursitis, right hip: Secondary | ICD-10-CM | POA: Diagnosis not present

## 2019-01-03 DIAGNOSIS — M1611 Unilateral primary osteoarthritis, right hip: Secondary | ICD-10-CM | POA: Diagnosis not present

## 2019-01-06 ENCOUNTER — Other Ambulatory Visit: Payer: Self-pay | Admitting: Family Medicine

## 2019-01-07 NOTE — Telephone Encounter (Signed)
L.O.V. was 11/12/2018.

## 2019-01-16 DIAGNOSIS — M7061 Trochanteric bursitis, right hip: Secondary | ICD-10-CM | POA: Diagnosis not present

## 2019-01-22 ENCOUNTER — Other Ambulatory Visit: Payer: Self-pay | Admitting: Family Medicine

## 2019-01-23 DIAGNOSIS — M1611 Unilateral primary osteoarthritis, right hip: Secondary | ICD-10-CM | POA: Diagnosis not present

## 2019-01-24 DIAGNOSIS — E538 Deficiency of other specified B group vitamins: Secondary | ICD-10-CM | POA: Diagnosis not present

## 2019-01-31 DIAGNOSIS — Z23 Encounter for immunization: Secondary | ICD-10-CM | POA: Diagnosis not present

## 2019-02-11 DIAGNOSIS — M1611 Unilateral primary osteoarthritis, right hip: Secondary | ICD-10-CM | POA: Diagnosis not present

## 2019-02-15 DIAGNOSIS — E538 Deficiency of other specified B group vitamins: Secondary | ICD-10-CM | POA: Diagnosis not present

## 2019-02-18 ENCOUNTER — Other Ambulatory Visit: Payer: Self-pay

## 2019-02-18 ENCOUNTER — Ambulatory Visit: Payer: Medicare Other | Attending: Neurology | Admitting: Physical Therapy

## 2019-02-18 ENCOUNTER — Encounter: Payer: Self-pay | Admitting: Physical Therapy

## 2019-02-18 DIAGNOSIS — M6281 Muscle weakness (generalized): Secondary | ICD-10-CM | POA: Insufficient documentation

## 2019-02-18 DIAGNOSIS — R262 Difficulty in walking, not elsewhere classified: Secondary | ICD-10-CM | POA: Insufficient documentation

## 2019-02-18 NOTE — Patient Instructions (Signed)
Heel Raise: Bilateral (Standing)    Rise on balls of feet. Repeat __20__ times per set. Do __2__ sets per session. Do __2__ sessions per day.  http://orth.exer.us/38   Copyright  VHI. All rights reserved.  Mini-Squats (Standing)    Stand with support. Bend knees slightly. Tighten pelvic floor. Hold for __3_ seconds. Return to straight standing.  Repeat __10_ times. Do _2__ times a day.  Copyright  VHI. All rights reserved.  Hip Extension (Standing)    Stand with support. . Move right leg backward with straight knee.  Repeat __10_ times. Do _2__ times a day.    Copyright  VHI. All rights reserved.

## 2019-02-18 NOTE — Therapy (Signed)
[place] MAIN [facility] SERVICES [address] [place], [place], [address] Phone: [phone]   Fax:  [phone]  Physical Therapy Evaluation  Patient Details  Name: Cameron Foster. MRN: [id number] Date of Birth: 03-21-1941 Referring Provider (PT): Lavon Paganini   Encounter Date: 02/18/2019  PT End of Session - 02/18/19 1608    Visit Number  1    Number of Visits  17    Date for PT Re-Evaluation  04/15/19    PT Start Time  0400    PT Stop Time  0445    PT Time Calculation (min)  45 min    Equipment Utilized During Treatment  Gait belt    Activity Tolerance  Patient tolerated treatment well    Behavior During Therapy  WFL for tasks assessed/performed       Past Medical History:  Diagnosis Date  . Alcoholism ([place])   . Arthritis   . Atrial fibrillation ([place])    one episode  . Colon polyp   . Depression    Son died 2014-06-16  . Diverticulitis   . Diverticulosis 30 years  . Dysrhythmia    At Fib 1995  . GERD (gastroesophageal reflux disease)   . Hyperlipidemia   . Hypertension   . Irritable bowel syndrome   . Prostate cancer ([place]) 06/16/10   treated with radiation therapy. Prostate  . Vasovagal syncope     Past Surgical History:  Procedure Laterality Date  . [place] no stents  . CATARACT EXTRACTION, BILATERAL    . COLON RESECTION SIGMOID N/A 12/07/2016   Procedure: COLON RESECTION SIGMOID;  Surgeon: Clayburn Pert, MD;  Location: [facility] ORS;  Service: General;  Laterality: N/A;  . COLON SURGERY  11/2016   Colostomy  . COLONOSCOPY  06/16/14  . COLONOSCOPY WITH PROPOFOL N/A 05/30/2016   Procedure: COLONOSCOPY WITH PROPOFOL;  Surgeon: Lollie Sails, MD;  Location: [facility] ENDOSCOPY;  Service: Endoscopy;  Laterality: N/A;  . COLOSTOMY Left 12/07/2016   Procedure: COLOSTOMY;  Surgeon: Clayburn Pert, MD;  Location: [facility] ORS;  Service: General;  Laterality: Left;  . COLOSTOMY REVERSAL N/A 03/21/2017    Procedure: COLOSTOMY REVERSAL;  Surgeon: Clayburn Pert, MD;  Location: [facility] ORS;  Service: General;  Laterality: N/A;  . COLOSTOMY TAKEDOWN N/A 03/21/2017   Procedure: LAPAROSCOPIC COLOSTOMY TAKEDOWN;  Surgeon: Clayburn Pert, MD;  Location: [facility] ORS;  Service: General;  Laterality: N/A;  . CYSTOSCOPY WITH STENT PLACEMENT Bilateral 03/21/2017   Procedure: CYSTOSCOPY WITH LIGHTED STENT PLACEMENT;  Surgeon: Abbie Sons, MD;  Location: [facility] ORS;  Service: Urology;  Laterality: Bilateral;  . ESOPHAGOGASTRODUODENOSCOPY    . ESOPHAGOGASTRODUODENOSCOPY N/A 05/30/2016   Procedure: ESOPHAGOGASTRODUODENOSCOPY (EGD);  Surgeon: Lollie Sails, MD;  Location: [facility] ENDOSCOPY;  Service: Endoscopy;  Laterality: N/A;  . EYE SURGERY     cataracts  . FLEXIBLE SIGMOIDOSCOPY N/A 03/21/2017   Procedure: FLEXIBLE SIGMOIDOSCOPY;  Surgeon: Clayburn Pert, MD;  Location: [facility] ORS;  Service: General;  Laterality: N/A;  . FRACTURE SURGERY Bilateral    right arm and left wrist  . INCISION AND DRAINAGE ABSCESS N/A 12/07/2016   Procedure: DRAINAGE  OF INTRA ABDOMINAL ABSCESS;  Surgeon: Clayburn Pert, MD;  Location: [facility] ORS;  Service: General;  Laterality: N/A;  . KNEE ARTHROSCOPY    . LAPAROTOMY N/A 12/07/2016   Procedure: EXPLORATORY LAPAROTOMY;  Surgeon: Clayburn Pert, MD;  Location: [facility] ORS;  Service: General;  Laterality: N/A;  . PROSTATE SURGERY  Microwave therapy  . TONSILLECTOMY    . TOTAL KNEE ARTHROPLASTY Right 07/27/2016   Procedure: TOTAL KNEE ARTHROPLASTY;  Surgeon: [name], MD;  Location: [facility] ORS;  Service: Orthopedics;  Laterality: Right;  Dr. Erlene Quan had to place Urinary catheter due to prostate cancer history.  Using flexible scope.    There were no vitals filed for this visit.   Subjective Assessment - 02/18/19 1605    Subjective  Patient had a TIA the end of June 2020 and he was admitted to [facility] for 3 days.    Pertinent History  Patient had a TIA 6/20 and was at [facility]  for 3 days. He is having balance instabiity and is having difficulty with walking.Pateint has bursitis in right hip, he has back pain that is intermittent and begins when he stands up. He had vertigo that started up again last week. It is intermittent. and today it is gone.    Limitations  Walking    How long can you stand comfortably?  20 mins    How long can you walk comfortably?  20 mins    Patient Stated Goals  to have better balance    Currently in Pain?  No/denies    Pain Score  0-No pain         [facility] PT Assessment - 02/18/19 0001      Assessment   Medical Diagnosis  TIA    Referring Provider (PT)  Lavon Paganini    Onset Date/Surgical Date  10/16/18    Hand Dominance  Right    Prior Therapy  no      Precautions   Precautions  None      Restrictions   Weight Bearing Restrictions  No      Balance Screen   Has the patient fallen in the past 6 months  No    Has the patient had a decrease in activity level because of a fear of falling?   Yes    Is the patient reluctant to leave their home because of a fear of falling?   No      Home Social worker  Private residence    Living Arrangements  Spouse/significant other    Available Help at Discharge  Family    Type of [place] Access  Level entry    [place]  One level    [place] - 2 wheels;[place] - single point;Shower seat;Hand held shower head      Prior Function   Level of Independence  Independent with household mobility without device;Other (comment)    Vocation  Retired    Office manager, fish      Cognition   Overall Cognitive Status  Within Functional Limits for tasks assessed      [place]  Yes      Standardized Balance Assessment   Standardized Balance Assessment  Berg Balance Test      Berg Balance Test   Sit to Stand  Able to stand without using hands and stabilize independently    Standing Unsupported  Able to stand safely 2 minutes     Sitting with Back Unsupported but Feet Supported on Floor or Stool  Able to sit safely and securely 2 minutes    Stand to Sit  Sits safely with minimal use of hands    Transfers  Able to transfer safely, minor use of hands    Standing Unsupported  with Eyes Closed  Able to stand 10 seconds safely    Standing Unsupported with Feet Together  Able to place feet together independently and stand 1 minute safely    From Standing, Reach Forward with Outstretched Arm  Can reach forward >12 cm safely (5")    From Standing Position, Pick up Object from [place] to pick up shoe, needs supervision    From Standing Position, Turn to Look Behind Over each Shoulder  Turn sideways only but maintains balance    Turn 360 Degrees  Able to turn 360 degrees safely one side only in 4 seconds or less    Standing Unsupported, Alternately Place Feet on Step/Stool  Able to complete 4 steps without aid or supervision    Standing Unsupported, One Foot in [employer] balance while stepping or standing    Standing on One Leg  Tries to lift leg/unable to hold 3 seconds but remains standing independently    Total Score  42         PAIN: Patient has intermittent back pain that increases when standing and goes away when he sits down  POSTURE:WNL  PROM/AROM:WFL  STRENGTH:  Graded on a 0-5 scale Muscle Group Left Right                          Hip Flex 4/5 -4/5  Hip Abd -4/5 -3/5  Hip Add -3/5 -3/5  Hip Ext 2/5 2/5      Knee Flex 5/5 5/5  Knee Ext 5/5 5/5  Ankle DF 5/5 5/5  Ankle PF     SENSATION: tingling in B feet toes R> L  FUNCTIONAL MOBILITY: Independent with bed mobility supine <> sit , supine<> prone   BALANCE: Static Standing Balance  Normal Able to maintain standing balance against maximal resistance   Good Able to maintain standing balance against moderate resistance   Good-/Fair+ Able to maintain standing balance against minimal resistance x  Fair Able to stand unsupported without UE  support and without LOB for 1-2 min   Fair- Requires Min A and UE support to maintain standing without loss of balance   Poor+ Requires mod A and UE support to maintain standing without loss of balance   Poor Requires max A and UE support to maintain standing balance without loss    Standing Dynamic Balance  Normal Stand independently unsupported, able to weight shift and cross midline maximally   Good Stand independently unsupported, able to weight shift and cross midline moderately x  Good-/Fair+ Stand independently unsupported, able to weight shift across midline minimally   Fair Stand independently unsupported, weight shift, and reach ipsilaterally, loss of balance when crossing midline   Poor+ Able to stand with Min A and reach ipsilaterally, unable to weight shift   Poor Able to stand with Mod A and minimally reach ipsilaterally, unable to cross midline.       GAIT: Patent ambulates without AD with decreased gait speed and path deviation  OUTCOME MEASURES: TEST Outcome Interpretation  5 times sit<>stand 14.67sec >24 yo, >15 sec indicates increased risk for falls  10 meter walk test       1.16          m/s <1.0 m/s indicates increased risk for falls; limited community ambulator  Timed up and Go       9.59          sec <14 sec indicates increased risk for falls  Berg Balance Assessment 42/56 <36/56 (100% risk for falls), 37-45 (80% risk for falls); 46-51 (>50% risk for falls); 52-55 (lower risk <25% of falls)        Treatement: Heel riases x 20 x 2 Squats with 3 sec hold x 10 x 2 Standing hip extension x 10 BLE  Patient performed with instruction, verbal cues, tactile cues of therapist: goal: increase tissue extensibility, promote proper posture, improve mobility        Objective measurements completed on examination: See above findings.              PT Education - 02/18/19 1607    Education Details  plan of care    Person(s) Educated  Patient     Methods  Explanation    Comprehension  Verbalized understanding       PT Short Term Goals - 02/18/19 1644      PT SHORT TERM GOAL #1   Title  Patient will be independent in home exercise program to improve strength/mobility for better functional independence with ADLs.    Time  4    Period  Weeks    Status  New    Target Date  03/18/19      PT SHORT TERM GOAL #2   Title  Patient will increase Berg Balance score by > 6 points to demonstrate decreased fall risk during functional activities.    Baseline  42/56    Time  4        PT Long Term Goals - 02/18/19 1729      PT LONG TERM GOAL #1   Title  Patient will increase ABC scale score >80% to demonstrate better functional mobility and better confidence with ADLs.    Time  8    Period  Weeks    Status  New    Target Date  04/15/19      PT LONG TERM GOAL #2   Title  Patient will increase LEFS score 8 points to demonstrate better functional mobility    Time  8    Period  Weeks    Status  New    Target Date  04/15/19      PT LONG TERM GOAL #3   Title  Patient will demonstrate an improved Berg Balance Score of  42/56 > as to demonstrate improved balance with ADLs such as sitting/standing and transfer balance and reduced fall risk.    Time  8    Period  Weeks    Status  New    Target Date  04/15/19             Plan - 02/18/19 1615    Clinical Impression Statement  Patient presents with decreased gait speed, decreased balance, and decreased BLE strength. Patient's main complaint is BLE weakness and inability to participate in desired activities. Patient wants to improve his balance and ability to ambulate on inclines and outdoor surfaces safely. Patient will benefit from skilled PT in order to increase gait speed, increase BLE strength, and improve dynamic standing balance to decrease risk for falls and enable patient to participate in desired activities.   Personal Factors and Comorbidities  Age;Comorbidity 1     Comorbidities  back pain, prostate cancer, knee replacement,    Examination-Activity Limitations  Carry;Dressing    Examination-Participation Restrictions  Yard Work;Church    Stability/Clinical Decision Making  Stable/Uncomplicated    Clinical Decision Making  Low    Rehab Potential  Good    PT Frequency  2x / week    PT Duration  8 weeks    PT Treatment/Interventions  [place] Repostioning;Neuromuscular re-education;Balance training;Therapeutic exercise;Patient/family education;Therapeutic activities;Passive range of motion    PT Next Visit Plan  balance, therapeutic exercise    PT Home Exercise Plan  heel raises, squats    Consulted and Agree with Plan of Care  Patient       Patient will benefit from skilled therapeutic intervention in order to improve the following deficits and impairments:  Impaired flexibility, Difficulty walking, Decreased safety awareness, Decreased balance, Abnormal gait, Decreased activity tolerance, Decreased endurance, Decreased range of motion, Pain  Visit Diagnosis: Difficulty in walking, not elsewhere classified  Muscle weakness (generalized)     Problem List Patient Active Problem List   Diagnosis Date Noted  . Diplopia 11/07/2018  . Paroxysmal atrial fibrillation ([place]) 11/07/2018  . TIA (transient ischemic attack) 10/20/2018  . SAH (subarachnoid hemorrhage) ([place]) 10/16/2018  . Recurrent sinusitis 10/16/2018  . Low libido 08/30/2018  . Class 1 obesity without serious comorbidity with body mass index (BMI) of 31.0 to 31.9 in adult 05/14/2018  . Mixed hyperlipidemia 05/14/2018  . Degeneration of lumbar intervertebral disc 03/22/2018  . Osteoarthritis of hip 03/22/2018  . Trochanteric bursitis of right hip 03/22/2018  . Status post partial colectomy 01/03/2017  . Depression, major, single episode, mild ([place]) 01/03/2017  . Vasovagal syncope 01/27/2016  . History of skin cancer 10/28/2015  . Osteoarthritis of right knee 10/01/2015  . Essential  hypertension 08/25/2015  . Dyspnea on exertion 08/12/2015  . Erectile dysfunction following radiation therapy 08/04/2015  . Constipation 08/02/2015  . History of prostate cancer 01/20/2015  . Swelling of right lower extremity 01/20/2015    Alanson Puls, [place] DPT 02/18/2019, 5:32 PM  [place] MAIN [facility] SERVICES [address] [place], [place], [address] Phone: [phone]   Fax:  [phone]  Name: Cameron Foster. MRN: [id number] Date of Birth: [birth year]

## 2019-02-20 ENCOUNTER — Ambulatory Visit: Payer: Medicare Other | Admitting: Physical Therapy

## 2019-02-20 ENCOUNTER — Encounter: Payer: Self-pay | Admitting: Physical Therapy

## 2019-02-20 ENCOUNTER — Other Ambulatory Visit: Payer: Self-pay

## 2019-02-20 DIAGNOSIS — M6281 Muscle weakness (generalized): Secondary | ICD-10-CM | POA: Diagnosis not present

## 2019-02-20 DIAGNOSIS — R262 Difficulty in walking, not elsewhere classified: Secondary | ICD-10-CM | POA: Diagnosis not present

## 2019-02-20 NOTE — Therapy (Signed)
[place] MAIN [facility] SERVICES [address] [place], [place], [address] Phone: [phone]   Fax:  [phone]  Physical Therapy Treatment  Patient Details  Name: Cameron Foster. MRN: [id number] Date of Birth: Mar 29, 1941 Referring Provider (PT): Lavon Paganini   Encounter Date: 02/20/2019  PT End of Session - 02/20/19 1612    Visit Number  2    Number of Visits  17    Date for PT Re-Evaluation  04/15/19    PT Start Time  0408    PT Stop Time  0446    PT Time Calculation (min)  38 min    Equipment Utilized During Treatment  Gait belt    Activity Tolerance  Patient tolerated treatment well    Behavior During Therapy  WFL for tasks assessed/performed       Past Medical History:  Diagnosis Date  . Alcoholism ([place])   . Arthritis   . Atrial fibrillation ([place])    one episode  . Colon polyp   . Depression    Son died 2014/06/25  . Diverticulitis   . Diverticulosis 30 years  . Dysrhythmia    At Fib 1995  . GERD (gastroesophageal reflux disease)   . Hyperlipidemia   . Hypertension   . Irritable bowel syndrome   . Prostate cancer ([place]) 06/25/2010   treated with radiation therapy. Prostate  . Vasovagal syncope     Past Surgical History:  Procedure Laterality Date  . [place] no stents  . CATARACT EXTRACTION, BILATERAL    . COLON RESECTION SIGMOID N/A 12/07/2016   Procedure: COLON RESECTION SIGMOID;  Surgeon: Clayburn Pert, MD;  Location: [facility] ORS;  Service: General;  Laterality: N/A;  . COLON SURGERY  11/2016   Colostomy  . COLONOSCOPY  06/25/14  . COLONOSCOPY WITH PROPOFOL N/A 05/30/2016   Procedure: COLONOSCOPY WITH PROPOFOL;  Surgeon: Lollie Sails, MD;  Location: [facility] ENDOSCOPY;  Service: Endoscopy;  Laterality: N/A;  . COLOSTOMY Left 12/07/2016   Procedure: COLOSTOMY;  Surgeon: Clayburn Pert, MD;  Location: [facility] ORS;  Service: General;  Laterality: Left;  . COLOSTOMY REVERSAL N/A 03/21/2017    Procedure: COLOSTOMY REVERSAL;  Surgeon: Clayburn Pert, MD;  Location: [facility] ORS;  Service: General;  Laterality: N/A;  . COLOSTOMY TAKEDOWN N/A 03/21/2017   Procedure: LAPAROSCOPIC COLOSTOMY TAKEDOWN;  Surgeon: Clayburn Pert, MD;  Location: [facility] ORS;  Service: General;  Laterality: N/A;  . CYSTOSCOPY WITH STENT PLACEMENT Bilateral 03/21/2017   Procedure: CYSTOSCOPY WITH LIGHTED STENT PLACEMENT;  Surgeon: Abbie Sons, MD;  Location: [facility] ORS;  Service: Urology;  Laterality: Bilateral;  . ESOPHAGOGASTRODUODENOSCOPY    . ESOPHAGOGASTRODUODENOSCOPY N/A 05/30/2016   Procedure: ESOPHAGOGASTRODUODENOSCOPY (EGD);  Surgeon: Lollie Sails, MD;  Location: [facility] ENDOSCOPY;  Service: Endoscopy;  Laterality: N/A;  . EYE SURGERY     cataracts  . FLEXIBLE SIGMOIDOSCOPY N/A 03/21/2017   Procedure: FLEXIBLE SIGMOIDOSCOPY;  Surgeon: Clayburn Pert, MD;  Location: [facility] ORS;  Service: General;  Laterality: N/A;  . FRACTURE SURGERY Bilateral    right arm and left wrist  . INCISION AND DRAINAGE ABSCESS N/A 12/07/2016   Procedure: DRAINAGE  OF INTRA ABDOMINAL ABSCESS;  Surgeon: Clayburn Pert, MD;  Location: [facility] ORS;  Service: General;  Laterality: N/A;  . KNEE ARTHROSCOPY    . LAPAROTOMY N/A 12/07/2016   Procedure: EXPLORATORY LAPAROTOMY;  Surgeon: Clayburn Pert, MD;  Location: [facility] ORS;  Service: General;  Laterality: N/A;  . PROSTATE SURGERY  Microwave therapy  . TONSILLECTOMY    . TOTAL KNEE ARTHROPLASTY Right 07/27/2016   Procedure: TOTAL KNEE ARTHROPLASTY;  Surgeon: [name], MD;  Location: [facility] ORS;  Service: Orthopedics;  Laterality: Right;  Dr. Erlene Quan had to place Urinary catheter due to prostate cancer history.  Using flexible scope.    There were no vitals filed for this visit.  Subjective Assessment - 02/20/19 1614    Subjective  Patient is doing ok today, no new concerns, he is running late today.    Pertinent History  Patient had a TIA 6/20 and was at [facility] for 3 days.  He is having balance instabiity and is having difficulty with walking.Pateint has bursitis in right hip, he has back pain that is intermittent and begins when he stands up. He had vertigo that started up again last week. It is intermittent. and today it is gone.    Limitations  Walking    How long can you stand comfortably?  20 mins    How long can you walk comfortably?  20 mins    Patient Stated Goals  to have better balance    Currently in Pain?  No/denies    Pain Score  0-No pain          Treatment: Neuromuscular training: Staggered stance, one foot on blue foam, one foot on stool x2 mins and trunk rotation standing on blue foam with head turns x 1 min  Standing feet together on blue foam with head turns x 1 min step ups from floor to 6 inch stool x 20 bilateral Step ups from blue foam to 6 inch stool x 20 BLE Rocker board fwd/bwd, side to side x 20 each direction, cues to stand taller and straighten knees Side stepping on 2"x4" without UE support x 2 lengths Heel/toe raises without UE support 3s hold x 10 each 1/2 foam roll balance with flat side up 30s x 2 reps 1/2 foam roll balance with flat side down 30s x 2 reps Leg press 100 lbs x 20 , heel raises 100 lbs x 20   Pt educated throughout session about proper posture and technique with exercises. Improved exercise technique, movement at target joints, use of target muscles after min to mod verbal, visual, tactile cues. CGA and Min to mod verbal cues used throughout with increased in postural sway and LOB most seen with narrow base of support and while on uneven surfaces. Continues to have balance deficits typical with diagnosis. Patient performs intermediate level exercises without pain behaviors and needs verbal cuing for postural alignment and head positioning                    PT Education - 02/20/19 1611    Education Details  HEP    Person(s) Educated  Patient    Methods  Explanation    Comprehension   Verbalized understanding;Returned demonstration;Verbal cues required;Tactile cues required;Need further instruction       PT Short Term Goals - 02/18/19 1644      PT SHORT TERM GOAL #1   Title  Patient will be independent in home exercise program to improve strength/mobility for better functional independence with ADLs.    Time  4    Period  Weeks    Status  New    Target Date  03/18/19      PT SHORT TERM GOAL #2   Title  Patient will increase Berg Balance score by > 6 points to demonstrate decreased fall risk  during functional activities.    Baseline  42/56    Time  4        PT Long Term Goals - 02/18/19 1729      PT LONG TERM GOAL #1   Title  Patient will increase ABC scale score >80% to demonstrate better functional mobility and better confidence with ADLs.    Time  8    Period  Weeks    Status  New    Target Date  04/15/19      PT LONG TERM GOAL #2   Title  Patient will increase LEFS score 8 points to demonstrate better functional mobility    Time  8    Period  Weeks    Status  New    Target Date  04/15/19      PT LONG TERM GOAL #3   Title  Patient will demonstrate an improved Berg Balance Score of  42/56 > as to demonstrate improved balance with ADLs such as sitting/standing and transfer balance and reduced fall risk.    Time  8    Period  Weeks    Status  New    Target Date  04/15/19            Plan - 02/20/19 1613    Clinical Impression Statement  Patient instructed in beginning balance and coordination exercise. Patient required mod VCs and min A for gait to improve weight shift and postural control. Patient requires min VCs to improve ankle stability with R AFO with gait.  Patients would benefit from additional skilled PT intervention to improve balance/gait safety and reduce fall risk.   Personal Factors and Comorbidities  Age;Comorbidity 1    Comorbidities  back pain, prostate cancer, knee replacement,    Examination-Activity Limitations   Carry;Dressing    Examination-Participation Restrictions  Yard Work;Church    Stability/Clinical Decision Making  Stable/Uncomplicated    Rehab Potential  Good    PT Frequency  2x / week    PT Duration  8 weeks    PT Treatment/Interventions  [place] Repostioning;Neuromuscular re-education;Balance training;Therapeutic exercise;Patient/family education;Therapeutic activities;Passive range of motion    PT Next Visit Plan  balance, therapeutic exercise    PT Home Exercise Plan  heel raises, squats    Consulted and Agree with Plan of Care  Patient       Patient will benefit from skilled therapeutic intervention in order to improve the following deficits and impairments:  Impaired flexibility, Difficulty walking, Decreased safety awareness, Decreased balance, Abnormal gait, Decreased activity tolerance, Decreased endurance, Decreased range of motion, Pain  Visit Diagnosis: Difficulty in walking, not elsewhere classified  Muscle weakness (generalized)     Problem List Patient Active Problem List   Diagnosis Date Noted  . Diplopia 11/07/2018  . Paroxysmal atrial fibrillation ([place]) 11/07/2018  . TIA (transient ischemic attack) 10/20/2018  . SAH (subarachnoid hemorrhage) ([place]) 10/16/2018  . Recurrent sinusitis 10/16/2018  . Low libido 08/30/2018  . Class 1 obesity without serious comorbidity with body mass index (BMI) of 31.0 to 31.9 in adult 05/14/2018  . Mixed hyperlipidemia 05/14/2018  . Degeneration of lumbar intervertebral disc 03/22/2018  . Osteoarthritis of hip 03/22/2018  . Trochanteric bursitis of right hip 03/22/2018  . Status post partial colectomy 01/03/2017  . Depression, major, single episode, mild ([place]) 01/03/2017  . Vasovagal syncope 01/27/2016  . History of skin cancer 10/28/2015  . Osteoarthritis of right knee 10/01/2015  . Essential hypertension 08/25/2015  . Dyspnea on exertion 08/12/2015  .  Erectile dysfunction following radiation therapy 08/04/2015  .  Constipation 08/02/2015  . History of prostate cancer 01/20/2015  . Swelling of right lower extremity 01/20/2015    Alanson Puls, [place] DPT 02/20/2019, 4:15 PM  [place] MAIN [facility] SERVICES [address] [place], [place], [address] Phone: [phone]   Fax:  [phone]  Name: Cameron Foster. MRN: [id number] Date of Birth: March 29, 1941

## 2019-02-22 MED ORDER — TRANEXAMIC ACID 1000 MG/10ML IV SOLN: 1000.0000 mg | INTRAVENOUS | Status: AC

## 2019-02-25 ENCOUNTER — Ambulatory Visit: Payer: Medicare Other | Admitting: Physical Therapy

## 2019-02-27 ENCOUNTER — Encounter: Payer: Self-pay | Admitting: Physical Therapy

## 2019-02-27 ENCOUNTER — Other Ambulatory Visit: Payer: Self-pay

## 2019-02-27 ENCOUNTER — Ambulatory Visit: Payer: Medicare Other | Admitting: Physical Therapy

## 2019-02-27 DIAGNOSIS — R262 Difficulty in walking, not elsewhere classified: Secondary | ICD-10-CM

## 2019-02-27 DIAGNOSIS — M6281 Muscle weakness (generalized): Secondary | ICD-10-CM | POA: Diagnosis not present

## 2019-02-27 NOTE — Therapy (Signed)
[place] MAIN [facility] SERVICES [address] [place], [place], [address] Phone: [phone]   Fax:  [phone]  Physical Therapy Treatment  Patient Details  Name: Cameron Foster. MRN: [id number] Date of Birth: 1940/12/07 Referring Provider (PT): Lavon Paganini   Encounter Date: 02/27/2019  PT End of Session - 02/27/19 1440    Visit Number  3    Number of Visits  17    Date for PT Re-Evaluation  04/15/19    PT Start Time  0230    PT Stop Time  0310    PT Time Calculation (min)  40 min    Equipment Utilized During Treatment  Gait belt    Activity Tolerance  Patient tolerated treatment well    Behavior During Therapy  WFL for tasks assessed/performed       Past Medical History:  Diagnosis Date  . Alcoholism ([place])   . Arthritis   . Atrial fibrillation ([place])    one episode  . Colon polyp   . Depression    Son died 06/25/14  . Diverticulitis   . Diverticulosis 30 years  . Dysrhythmia    At Fib 1995  . GERD (gastroesophageal reflux disease)   . Hyperlipidemia   . Hypertension   . Irritable bowel syndrome   . Prostate cancer ([place]) 2010-06-25   treated with radiation therapy. Prostate  . Vasovagal syncope     Past Surgical History:  Procedure Laterality Date  . [place] no stents  . CATARACT EXTRACTION, BILATERAL    . COLON RESECTION SIGMOID N/A 12/07/2016   Procedure: COLON RESECTION SIGMOID;  Surgeon: Clayburn Pert, MD;  Location: [facility] ORS;  Service: General;  Laterality: N/A;  . COLON SURGERY  11/2016   Colostomy  . COLONOSCOPY  2014-06-25  . COLONOSCOPY WITH PROPOFOL N/A 05/30/2016   Procedure: COLONOSCOPY WITH PROPOFOL;  Surgeon: Lollie Sails, MD;  Location: [facility] ENDOSCOPY;  Service: Endoscopy;  Laterality: N/A;  . COLOSTOMY Left 12/07/2016   Procedure: COLOSTOMY;  Surgeon: Clayburn Pert, MD;  Location: [facility] ORS;  Service: General;  Laterality: Left;  . COLOSTOMY REVERSAL N/A 03/21/2017    Procedure: COLOSTOMY REVERSAL;  Surgeon: Clayburn Pert, MD;  Location: [facility] ORS;  Service: General;  Laterality: N/A;  . COLOSTOMY TAKEDOWN N/A 03/21/2017   Procedure: LAPAROSCOPIC COLOSTOMY TAKEDOWN;  Surgeon: Clayburn Pert, MD;  Location: [facility] ORS;  Service: General;  Laterality: N/A;  . CYSTOSCOPY WITH STENT PLACEMENT Bilateral 03/21/2017   Procedure: CYSTOSCOPY WITH LIGHTED STENT PLACEMENT;  Surgeon: Abbie Sons, MD;  Location: [facility] ORS;  Service: Urology;  Laterality: Bilateral;  . ESOPHAGOGASTRODUODENOSCOPY    . ESOPHAGOGASTRODUODENOSCOPY N/A 05/30/2016   Procedure: ESOPHAGOGASTRODUODENOSCOPY (EGD);  Surgeon: Lollie Sails, MD;  Location: [facility] ENDOSCOPY;  Service: Endoscopy;  Laterality: N/A;  . EYE SURGERY     cataracts  . FLEXIBLE SIGMOIDOSCOPY N/A 03/21/2017   Procedure: FLEXIBLE SIGMOIDOSCOPY;  Surgeon: Clayburn Pert, MD;  Location: [facility] ORS;  Service: General;  Laterality: N/A;  . FRACTURE SURGERY Bilateral    right arm and left wrist  . INCISION AND DRAINAGE ABSCESS N/A 12/07/2016   Procedure: DRAINAGE  OF INTRA ABDOMINAL ABSCESS;  Surgeon: Clayburn Pert, MD;  Location: [facility] ORS;  Service: General;  Laterality: N/A;  . KNEE ARTHROSCOPY    . LAPAROTOMY N/A 12/07/2016   Procedure: EXPLORATORY LAPAROTOMY;  Surgeon: Clayburn Pert, MD;  Location: [facility] ORS;  Service: General;  Laterality: N/A;  . PROSTATE SURGERY  Microwave therapy  . TONSILLECTOMY    . TOTAL KNEE ARTHROPLASTY Right 07/27/2016   Procedure: TOTAL KNEE ARTHROPLASTY;  Surgeon: [name], MD;  Location: [facility] ORS;  Service: Orthopedics;  Laterality: Right;  Dr. Erlene Quan had to place Urinary catheter due to prostate cancer history.  Using flexible scope.    There were no vitals filed for this visit.  Subjective Assessment - 02/27/19 1439    Subjective  Patient is doing ok today, no new concerns.    Pertinent History  Patient had a TIA 6/20 and was at [facility] for 3 days. He is having balance  instabiity and is having difficulty with walking.Pateint has bursitis in right hip, he has back pain that is intermittent and begins when he stands up. He had vertigo that started up again last week. It is intermittent. and today it is gone.    Limitations  Walking    How long can you stand comfortably?  20 mins    How long can you walk comfortably?  20 mins    Patient Stated Goals  to have better balance    Currently in Pain?  No/denies    Pain Score  0-No pain       Neuromuscular Re-education  Rocker board fwd/bwd, side to side x 20 each direction Tandem gait on foam beam  without UE support x 2 lengths Side stepping on foam beam  without UE support x 2 lengths Heel/toe raises without UE support 3s hold x 10 each 1/2 foam roll balance with flat side up 30s x 2 reps 1/2 foam roll balance with flat side down 30s x 2 reps 1/2 foam roll tandem balance alternating forward LE 30s x 2 each LE forward Lateral side steps from foam to 6 inch stool left and right x 15 Backwards stepping from foam to 6 inch stool x 15  [place] fwd/ bwd, ;side stepping x 10  Patient performed with instruction, verbal cues, tactile cues of therapist: goal: increase tissue extensibility, promote proper posture, improve mobility      Pt educated throughout session about proper posture and technique with exercises. Improved exercise technique, movement at target joints, use of target muscles after min to mod verbal, visual, tactile cues. CGA and Min to mod verbal cues used throughout with increased in postural sway and LOB most seen with narrow base of support and while on uneven surfaces. Continues to have balance deficits typical with diagnosis. Patient performs intermediate level exercises without pain behaviors and needs verbal cuing for postural alignment and head positioning                        PT Education - 02/27/19 1440    Education Details  HEP    Person(s) Educated  Patient     Methods  Explanation    Comprehension  Verbalized understanding;Returned demonstration;Need further instruction       PT Short Term Goals - 02/18/19 1644      PT SHORT TERM GOAL #1   Title  Patient will be independent in home exercise program to improve strength/mobility for better functional independence with ADLs.    Time  4    Period  Weeks    Status  New    Target Date  03/18/19      PT SHORT TERM GOAL #2   Title  Patient will increase Berg Balance score by > 6 points to demonstrate decreased fall risk during functional activities.    Baseline  42/56    Time  4        PT Long Term Goals - 02/18/19 1729      PT LONG TERM GOAL #1   Title  Patient will increase ABC scale score >80% to demonstrate better functional mobility and better confidence with ADLs.    Time  8    Period  Weeks    Status  New    Target Date  04/15/19      PT LONG TERM GOAL #2   Title  Patient will increase LEFS score 8 points to demonstrate better functional mobility    Time  8    Period  Weeks    Status  New    Target Date  04/15/19      PT LONG TERM GOAL #3   Title  Patient will demonstrate an improved Berg Balance Score of  42/56 > as to demonstrate improved balance with ADLs such as sitting/standing and transfer balance and reduced fall risk.    Time  8    Period  Weeks    Status  New    Target Date  04/15/19            Plan - 02/27/19 1441    Clinical Impression Statement  Patient instructed in intermediate strengthening and balance exercise.  Patient requires min Vcs for correct exercise technique including to improve LE control with standing exercise. Patient demonstrates better quad control with SLS tasks with rail assist. Patient would benefit from additional skilled PT intervention to improve balance/gait safety and reduce fall risk.    Personal Factors and Comorbidities  Age;Comorbidity 1    Comorbidities  back pain, prostate cancer, knee replacement,    Examination-Activity  Limitations  Carry;Dressing    Examination-Participation Restrictions  Yard Work;Church    Stability/Clinical Decision Making  Stable/Uncomplicated    Rehab Potential  Good    PT Frequency  2x / week    PT Duration  8 weeks    PT Treatment/Interventions  Canalith Repostioning;Neuromuscular re-education;Balance training;Therapeutic exercise;Patient/family education;Therapeutic activities;Passive range of motion    PT Next Visit Plan  balance, therapeutic exercise    PT Home Exercise Plan  heel raises, squats    Consulted and Agree with Plan of Care  Patient       Patient will benefit from skilled therapeutic intervention in order to improve the following deficits and impairments:  Impaired flexibility, Difficulty walking, Decreased safety awareness, Decreased balance, Abnormal gait, Decreased activity tolerance, Decreased endurance, Decreased range of motion, Pain  Visit Diagnosis: Muscle weakness (generalized)  Difficulty in walking, not elsewhere classified     Problem List Patient Active Problem List   Diagnosis Date Noted  . Diplopia 11/07/2018  . Paroxysmal atrial fibrillation ([place]) 11/07/2018  . TIA (transient ischemic attack) 10/20/2018  . SAH (subarachnoid hemorrhage) ([place]) 10/16/2018  . Recurrent sinusitis 10/16/2018  . Low libido 08/30/2018  . Class 1 obesity without serious comorbidity with body mass index (BMI) of 31.0 to 31.9 in adult 05/14/2018  . Mixed hyperlipidemia 05/14/2018  . Degeneration of lumbar intervertebral disc 03/22/2018  . Osteoarthritis of hip 03/22/2018  . Trochanteric bursitis of right hip 03/22/2018  . Status post partial colectomy 01/03/2017  . Depression, major, single episode, mild ([place]) 01/03/2017  . Vasovagal syncope 01/27/2016  . History of skin cancer 10/28/2015  . Osteoarthritis of right knee 10/01/2015  . Essential hypertension 08/25/2015  . Dyspnea on exertion 08/12/2015  . Erectile dysfunction following radiation therapy  08/04/2015  .  Constipation 08/02/2015  . History of prostate cancer 01/20/2015  . Swelling of right lower extremity 01/20/2015    Arelia Sneddon S,PT DPT 02/27/2019, 3:09 PM  [place] MAIN [facility] SERVICES [address] [place], [place], [address] Phone: [phone]   Fax:  [phone]  Name: Cameron Foster. MRN: [id number] Date of Birth: July 08, 1940

## 2019-03-04 ENCOUNTER — Ambulatory Visit: Payer: Medicare Other | Admitting: Physical Therapy

## 2019-03-06 ENCOUNTER — Other Ambulatory Visit: Payer: Self-pay

## 2019-03-06 ENCOUNTER — Ambulatory Visit: Payer: Medicare Other | Admitting: Physical Therapy

## 2019-03-06 ENCOUNTER — Encounter: Payer: Self-pay | Admitting: Physical Therapy

## 2019-03-06 DIAGNOSIS — R262 Difficulty in walking, not elsewhere classified: Secondary | ICD-10-CM

## 2019-03-06 DIAGNOSIS — M6281 Muscle weakness (generalized): Secondary | ICD-10-CM

## 2019-03-06 NOTE — Therapy (Signed)
[place] MAIN [facility] SERVICES [address] [place], [place], [address] Phone: [phone]   Fax:  [phone]  Physical Therapy Treatment  Patient Details  Name: Cameron Foster. MRN: [id number] Date of Birth: [birth year] Referring Provider (PT): Lavon Paganini   Encounter Date: 03/06/2019  PT End of Session - 03/06/19 1425    Visit Number  4    Number of Visits  17    Date for PT Re-Evaluation  04/15/19    PT Start Time  0230    PT Stop Time  0310    PT Time Calculation (min)  40 min    Equipment Utilized During Treatment  Gait belt    Activity Tolerance  Patient tolerated treatment well    Behavior During Therapy  WFL for tasks assessed/performed       Past Medical History:  Diagnosis Date  . Alcoholism ([place])   . Arthritis   . Atrial fibrillation ([place])    one episode  . Colon polyp   . Depression    Son died 28-Jun-2014  . Diverticulitis   . Diverticulosis 30 years  . Dysrhythmia    At Fib 1995  . GERD (gastroesophageal reflux disease)   . Hyperlipidemia   . Hypertension   . Irritable bowel syndrome   . Prostate cancer ([place]) 2010/06/28   treated with radiation therapy. Prostate  . Vasovagal syncope     Past Surgical History:  Procedure Laterality Date  . [place] no stents  . CATARACT EXTRACTION, BILATERAL    . COLON RESECTION SIGMOID N/A 12/07/2016   Procedure: COLON RESECTION SIGMOID;  Surgeon: Clayburn Pert, MD;  Location: [facility] ORS;  Service: General;  Laterality: N/A;  . COLON SURGERY  11/2016   Colostomy  . COLONOSCOPY  06/28/2014  . COLONOSCOPY WITH PROPOFOL N/A 05/30/2016   Procedure: COLONOSCOPY WITH PROPOFOL;  Surgeon: Lollie Sails, MD;  Location: [facility] ENDOSCOPY;  Service: Endoscopy;  Laterality: N/A;  . COLOSTOMY Left 12/07/2016   Procedure: COLOSTOMY;  Surgeon: Clayburn Pert, MD;  Location: [facility] ORS;  Service: General;  Laterality: Left;  . COLOSTOMY REVERSAL N/A 03/21/2017    Procedure: COLOSTOMY REVERSAL;  Surgeon: Clayburn Pert, MD;  Location: [facility] ORS;  Service: General;  Laterality: N/A;  . COLOSTOMY TAKEDOWN N/A 03/21/2017   Procedure: LAPAROSCOPIC COLOSTOMY TAKEDOWN;  Surgeon: Clayburn Pert, MD;  Location: [facility] ORS;  Service: General;  Laterality: N/A;  . CYSTOSCOPY WITH STENT PLACEMENT Bilateral 03/21/2017   Procedure: CYSTOSCOPY WITH LIGHTED STENT PLACEMENT;  Surgeon: Abbie Sons, MD;  Location: [facility] ORS;  Service: Urology;  Laterality: Bilateral;  . ESOPHAGOGASTRODUODENOSCOPY    . ESOPHAGOGASTRODUODENOSCOPY N/A 05/30/2016   Procedure: ESOPHAGOGASTRODUODENOSCOPY (EGD);  Surgeon: Lollie Sails, MD;  Location: [facility] ENDOSCOPY;  Service: Endoscopy;  Laterality: N/A;  . EYE SURGERY     cataracts  . FLEXIBLE SIGMOIDOSCOPY N/A 03/21/2017   Procedure: FLEXIBLE SIGMOIDOSCOPY;  Surgeon: Clayburn Pert, MD;  Location: [facility] ORS;  Service: General;  Laterality: N/A;  . FRACTURE SURGERY Bilateral    right arm and left wrist  . INCISION AND DRAINAGE ABSCESS N/A 12/07/2016   Procedure: DRAINAGE  OF INTRA ABDOMINAL ABSCESS;  Surgeon: Clayburn Pert, MD;  Location: [facility] ORS;  Service: General;  Laterality: N/A;  . KNEE ARTHROSCOPY    . LAPAROTOMY N/A 12/07/2016   Procedure: EXPLORATORY LAPAROTOMY;  Surgeon: Clayburn Pert, MD;  Location: [facility] ORS;  Service: General;  Laterality: N/A;  . PROSTATE SURGERY  Microwave therapy  . TONSILLECTOMY    . TOTAL KNEE ARTHROPLASTY Right 07/27/2016   Procedure: TOTAL KNEE ARTHROPLASTY;  Surgeon: [name], MD;  Location: [facility] ORS;  Service: Orthopedics;  Laterality: Right;  Dr. Erlene Quan had to place Urinary catheter due to prostate cancer history.  Using flexible scope.    There were no vitals filed for this visit.   Neuromuscular Re-education  Rocker board fwd/bwd, side to side x 20 each direction Tandem gait on 2"x4" without UE support x 2 lengths Side stepping on 2"x4" without UE support x 2  lengths Heel/toe raises without UE support 3s hold x 10 each 1/2 foam roll balance with flat side up 30s x 2 reps 1/2 foam roll balance with flat side down 30s x 2 reps 1/2 foam roll tandem balance alternating forward LE 30s x 2 each LE forward Lateral side steps from foam to 6 inch stool left and right x 15 Backwards stepping from foam to 6 inch stool x 15  [place] fwd/bwd, side to side , diagonal x 10 ,cues for posture and stepping strategies, occasional LOB [place] stepping left and right x 10       Pt educated throughout session about proper posture and technique with exercises. Improved exercise technique, movement at target joints, use of target muscles after min to mod verbal, visual, tactile cues. CGA and Min to mod verbal cues used throughout with increased in postural sway and LOB most seen with narrow base of support and while on uneven surfaces. Continues to have balance deficits typical with diagnosis. Patient performs intermediate level exercises without pain behaviors and needs verbal cuing for postural alignment and head positioning                          PT Education - 03/06/19 1425    Education Details  HEP    Person(s) Educated  Patient    Methods  Explanation    Comprehension  Verbalized understanding;Returned demonstration       PT Short Term Goals - 02/18/19 1644      PT SHORT TERM GOAL #1   Title  Patient will be independent in home exercise program to improve strength/mobility for better functional independence with ADLs.    Time  4    Period  Weeks    Status  New    Target Date  03/18/19      PT SHORT TERM GOAL #2   Title  Patient will increase Berg Balance score by > 6 points to demonstrate decreased fall risk during functional activities.    Baseline  42/56    Time  4        PT Long Term Goals - 02/18/19 1729      PT LONG TERM GOAL #1   Title  Patient will increase ABC scale score >80% to demonstrate better functional  mobility and better confidence with ADLs.    Time  8    Period  Weeks    Status  New    Target Date  04/15/19      PT LONG TERM GOAL #2   Title  Patient will increase LEFS score 8 points to demonstrate better functional mobility    Time  8    Period  Weeks    Status  New    Target Date  04/15/19      PT LONG TERM GOAL #3   Title  Patient will demonstrate an improved Merrilee Jansky  Balance Score of  42/56 > as to demonstrate improved balance with ADLs such as sitting/standing and transfer balance and reduced fall risk.    Time  8    Period  Weeks    Status  New    Target Date  04/15/19            Plan - 03/06/19 1426    Clinical Impression Statement Patient required min verbal cues to perform matrix fwd/ bwd/ side stepping with weights for posture and control and required verbal and tactile cues during all dynamic standing balance activities. Patient demonstrated decreased gait speed with ambulation without AD.  Patient will continue to benefit from skilled therapy in order to improve strength, dynamic standing balance and increase gait speed to reduce risk for falls   Personal Factors and Comorbidities  Age;Comorbidity 1    Comorbidities  back pain, prostate cancer, knee replacement,    Examination-Activity Limitations  Carry;Dressing    Examination-Participation Restrictions  Yard Work;Church    Stability/Clinical Decision Making  Stable/Uncomplicated    Rehab Potential  Good    PT Frequency  2x / week    PT Duration  8 weeks    PT Treatment/Interventions  Canalith Repostioning;Neuromuscular re-education;Balance training;Therapeutic exercise;Patient/family education;Therapeutic activities;Passive range of motion    PT Next Visit Plan  balance, therapeutic exercise    PT Home Exercise Plan  heel raises, squats    Consulted and Agree with Plan of Care  Patient       Patient will benefit from skilled therapeutic intervention in order to improve the following deficits and impairments:   Impaired flexibility, Difficulty walking, Decreased safety awareness, Decreased balance, Abnormal gait, Decreased activity tolerance, Decreased endurance, Decreased range of motion, Pain  Visit Diagnosis: Muscle weakness (generalized)  Difficulty in walking, not elsewhere classified     Problem List Patient Active Problem List   Diagnosis Date Noted  . Diplopia 11/07/2018  . Paroxysmal atrial fibrillation ([place]) 11/07/2018  . TIA (transient ischemic attack) 10/20/2018  . SAH (subarachnoid hemorrhage) ([place]) 10/16/2018  . Recurrent sinusitis 10/16/2018  . Low libido 08/30/2018  . Class 1 obesity without serious comorbidity with body mass index (BMI) of 31.0 to 31.9 in adult 05/14/2018  . Mixed hyperlipidemia 05/14/2018  . Degeneration of lumbar intervertebral disc 03/22/2018  . Osteoarthritis of hip 03/22/2018  . Trochanteric bursitis of right hip 03/22/2018  . Status post partial colectomy 01/03/2017  . Depression, major, single episode, mild ([place]) 01/03/2017  . Vasovagal syncope 01/27/2016  . History of skin cancer 10/28/2015  . Osteoarthritis of right knee 10/01/2015  . Essential hypertension 08/25/2015  . Dyspnea on exertion 08/12/2015  . Erectile dysfunction following radiation therapy 08/04/2015  . Constipation 08/02/2015  . History of prostate cancer 01/20/2015  . Swelling of right lower extremity 01/20/2015    Alanson Puls, [place] DPT 03/06/2019, 2:39 PM  [place] MAIN [facility] SERVICES [address] [place], [place], [address] Phone: [phone]   Fax:  [phone]  Name: Al Bracewell. MRN: [id number] Date of Birth: [birth year]

## 2019-03-11 ENCOUNTER — Other Ambulatory Visit: Payer: Self-pay

## 2019-03-11 ENCOUNTER — Encounter: Payer: Self-pay | Admitting: Physical Therapy

## 2019-03-11 ENCOUNTER — Ambulatory Visit: Payer: Medicare Other | Admitting: Physical Therapy

## 2019-03-11 DIAGNOSIS — R262 Difficulty in walking, not elsewhere classified: Secondary | ICD-10-CM | POA: Diagnosis not present

## 2019-03-11 DIAGNOSIS — M6281 Muscle weakness (generalized): Secondary | ICD-10-CM

## 2019-03-11 NOTE — Therapy (Signed)
[place] MAIN [facility] SERVICES [address] [place], [place], [address] Phone: [phone]   Fax:  [phone]  Physical Therapy Treatment  Patient Details  Name: Cameron Foster. MRN: [id number] Date of Birth: 02-01-1941 Referring Provider (PT): Lavon Paganini   Encounter Date: 03/11/2019  PT End of Session - 03/11/19 1436    Visit Number  5    Number of Visits  17    Date for PT Re-Evaluation  04/15/19    PT Start Time  0231    PT Stop Time  0310    PT Time Calculation (min)  39 min    Equipment Utilized During Treatment  Gait belt    Activity Tolerance  Patient tolerated treatment well    Behavior During Therapy  WFL for tasks assessed/performed       Past Medical History:  Diagnosis Date  . Alcoholism ([place])   . Arthritis   . Atrial fibrillation ([place])    one episode  . Colon polyp   . Depression    Son died 07/11/2014  . Diverticulitis   . Diverticulosis 30 years  . Dysrhythmia    At Fib 1995  . GERD (gastroesophageal reflux disease)   . Hyperlipidemia   . Hypertension   . Irritable bowel syndrome   . Prostate cancer ([place]) 07/11/10   treated with radiation therapy. Prostate  . Vasovagal syncope     Past Surgical History:  Procedure Laterality Date  . [place] no stents  . CATARACT EXTRACTION, BILATERAL    . COLON RESECTION SIGMOID N/A 12/07/2016   Procedure: COLON RESECTION SIGMOID;  Surgeon: Clayburn Pert, MD;  Location: [facility] ORS;  Service: General;  Laterality: N/A;  . COLON SURGERY  11/2016   Colostomy  . COLONOSCOPY  07-11-14  . COLONOSCOPY WITH PROPOFOL N/A 05/30/2016   Procedure: COLONOSCOPY WITH PROPOFOL;  Surgeon: Lollie Sails, MD;  Location: [facility] ENDOSCOPY;  Service: Endoscopy;  Laterality: N/A;  . COLOSTOMY Left 12/07/2016   Procedure: COLOSTOMY;  Surgeon: Clayburn Pert, MD;  Location: [facility] ORS;  Service: General;  Laterality: Left;  . COLOSTOMY REVERSAL N/A 03/21/2017    Procedure: COLOSTOMY REVERSAL;  Surgeon: Clayburn Pert, MD;  Location: [facility] ORS;  Service: General;  Laterality: N/A;  . COLOSTOMY TAKEDOWN N/A 03/21/2017   Procedure: LAPAROSCOPIC COLOSTOMY TAKEDOWN;  Surgeon: Clayburn Pert, MD;  Location: [facility] ORS;  Service: General;  Laterality: N/A;  . CYSTOSCOPY WITH STENT PLACEMENT Bilateral 03/21/2017   Procedure: CYSTOSCOPY WITH LIGHTED STENT PLACEMENT;  Surgeon: Abbie Sons, MD;  Location: [facility] ORS;  Service: Urology;  Laterality: Bilateral;  . ESOPHAGOGASTRODUODENOSCOPY    . ESOPHAGOGASTRODUODENOSCOPY N/A 05/30/2016   Procedure: ESOPHAGOGASTRODUODENOSCOPY (EGD);  Surgeon: Lollie Sails, MD;  Location: [facility] ENDOSCOPY;  Service: Endoscopy;  Laterality: N/A;  . EYE SURGERY     cataracts  . FLEXIBLE SIGMOIDOSCOPY N/A 03/21/2017   Procedure: FLEXIBLE SIGMOIDOSCOPY;  Surgeon: Clayburn Pert, MD;  Location: [facility] ORS;  Service: General;  Laterality: N/A;  . FRACTURE SURGERY Bilateral    right arm and left wrist  . INCISION AND DRAINAGE ABSCESS N/A 12/07/2016   Procedure: DRAINAGE  OF INTRA ABDOMINAL ABSCESS;  Surgeon: Clayburn Pert, MD;  Location: [facility] ORS;  Service: General;  Laterality: N/A;  . KNEE ARTHROSCOPY    . LAPAROTOMY N/A 12/07/2016   Procedure: EXPLORATORY LAPAROTOMY;  Surgeon: Clayburn Pert, MD;  Location: [facility] ORS;  Service: General;  Laterality: N/A;  . PROSTATE SURGERY  Microwave therapy  . TONSILLECTOMY    . TOTAL KNEE ARTHROPLASTY Right 07/27/2016   Procedure: TOTAL KNEE ARTHROPLASTY;  Surgeon: [name], MD;  Location: [facility] ORS;  Service: Orthopedics;  Laterality: Right;  Dr. Erlene Quan had to place Urinary catheter due to prostate cancer history.  Using flexible scope.    There were no vitals filed for this visit.  Subjective Assessment - 03/11/19 1436    Subjective  Patient is doing ok today, no new concerns.    Pertinent History  Patient had a TIA 6/20 and was at [facility] for 3 days. He is having balance  instabiity and is having difficulty with walking.Pateint has bursitis in right hip, he has back pain that is intermittent and begins when he stands up. He had vertigo that started up again last week. It is intermittent. and today it is gone.    Limitations  Walking    How long can you stand comfortably?  20 mins    How long can you walk comfortably?  20 mins    Patient Stated Goals  to have better balance    Currently in Pain?  No/denies    Pain Score  0-No pain    Multiple Pain Sites  No         Neuromuscular Re-education  Rocker board fwd/bwd, side to side x 20 each direction Tandem gait on 2"x4" without UE support x 2 lengths Side stepping on 2"x4" without UE support x 2 lengths Heel/toe raises without UE support 3s hold x 10 each 1/2 foam roll balance with flat side up 30s x 2 reps 1/2 foam roll balance with flat side down 30s x 2 reps 1/2 foam roll tandem balance alternating forward LE 30s x 2 each LE forward Lateral side steps from foam to 6 inch stool left and right x 15 Backwards stepping from foam to 6 inch stool x 15  Leg press 120 lbs x 20 x 3 sets, 60 lbs x 20 x 3 heel raises Stepping over hurdle fwd/bwd x 15     Pt educated throughout session about proper posture and technique with exercises. Improved exercise technique, movement at target joints, use of target muscles after min to mod verbal, visual, tactile cues. CGA and Min to mod verbal cues used throughout with increased in postural sway and LOB most seen with narrow base of support and while on uneven surfaces. Continues to have balance deficits typical with diagnosis. Patient performs intermediate level exercises without pain behaviors and needs verbal cuing for postural alignment and head positioning                       PT Education - 03/11/19 1436    Education Details  HEP    Person(s) Educated  Patient    Methods  Explanation    Comprehension  Verbalized understanding;Returned  demonstration;Need further instruction       PT Short Term Goals - 02/18/19 1644      PT SHORT TERM GOAL #1   Title  Patient will be independent in home exercise program to improve strength/mobility for better functional independence with ADLs.    Time  4    Period  Weeks    Status  New    Target Date  03/18/19      PT SHORT TERM GOAL #2   Title  Patient will increase Berg Balance score by > 6 points to demonstrate decreased fall risk during functional activities.    Baseline  42/56    Time  4        PT Long Term Goals - 02/18/19 1729      PT LONG TERM GOAL #1   Title  Patient will increase ABC scale score >80% to demonstrate better functional mobility and better confidence with ADLs.    Time  8    Period  Weeks    Status  New    Target Date  04/15/19      PT LONG TERM GOAL #2   Title  Patient will increase LEFS score 8 points to demonstrate better functional mobility    Time  8    Period  Weeks    Status  New    Target Date  04/15/19      PT LONG TERM GOAL #3   Title  Patient will demonstrate an improved Berg Balance Score of  42/56 > as to demonstrate improved balance with ADLs such as sitting/standing and transfer balance and reduced fall risk.    Time  8    Period  Weeks    Status  New    Target Date  04/15/19            Plan - 03/11/19 1437    Clinical Impression Statement  Pt presents with unsteadiness on uneven surfaces and fatigues with therapeutic exercises. Patient needs assist with advanced balance exercises and needs CGA assist with narrow base of support and single leg balance activities.Patient tolerated all interventions well this date and will benefit from continued skilled PT interventions to improve strength and balance and decrease risk of falling    Personal Factors and Comorbidities  Age;Comorbidity 1    Comorbidities  back pain, prostate cancer, knee replacement,    Examination-Activity Limitations  Carry;Dressing     Examination-Participation Restrictions  Yard Work;Church    Stability/Clinical Decision Making  Stable/Uncomplicated    Rehab Potential  Good    PT Frequency  2x / week    PT Duration  8 weeks    PT Treatment/Interventions  Canalith Repostioning;Neuromuscular re-education;Balance training;Therapeutic exercise;Patient/family education;Therapeutic activities;Passive range of motion    PT Next Visit Plan  balance, therapeutic exercise    PT Home Exercise Plan  heel raises, squats    Consulted and Agree with Plan of Care  Patient       Patient will benefit from skilled therapeutic intervention in order to improve the following deficits and impairments:  Impaired flexibility, Difficulty walking, Decreased safety awareness, Decreased balance, Abnormal gait, Decreased activity tolerance, Decreased endurance, Decreased range of motion, Pain  Visit Diagnosis: Difficulty in walking, not elsewhere classified  Muscle weakness (generalized)     Problem List Patient Active Problem List   Diagnosis Date Noted  . Diplopia 11/07/2018  . Paroxysmal atrial fibrillation ([place]) 11/07/2018  . TIA (transient ischemic attack) 10/20/2018  . SAH (subarachnoid hemorrhage) ([place]) 10/16/2018  . Recurrent sinusitis 10/16/2018  . Low libido 08/30/2018  . Class 1 obesity without serious comorbidity with body mass index (BMI) of 31.0 to 31.9 in adult 05/14/2018  . Mixed hyperlipidemia 05/14/2018  . Degeneration of lumbar intervertebral disc 03/22/2018  . Osteoarthritis of hip 03/22/2018  . Trochanteric bursitis of right hip 03/22/2018  . Status post partial colectomy 01/03/2017  . Depression, major, single episode, mild ([place]) 01/03/2017  . Vasovagal syncope 01/27/2016  . History of skin cancer 10/28/2015  . Osteoarthritis of right knee 10/01/2015  . Essential hypertension 08/25/2015  . Dyspnea on exertion 08/12/2015  . Erectile dysfunction following radiation  therapy 08/04/2015  . Constipation 08/02/2015   . History of prostate cancer 01/20/2015  . Swelling of right lower extremity 01/20/2015    Alanson Puls , [place] DPT 03/11/2019, 2:37 PM  [place] MAIN [facility] SERVICES [address] [place], [place], [address] Phone: [phone]   Fax:  [phone]  Name: Galileo Colello. MRN: [id number] Date of Birth: [birth year]

## 2019-03-13 ENCOUNTER — Ambulatory Visit: Payer: Medicare Other

## 2019-03-13 ENCOUNTER — Other Ambulatory Visit: Payer: Self-pay

## 2019-03-13 DIAGNOSIS — R262 Difficulty in walking, not elsewhere classified: Secondary | ICD-10-CM

## 2019-03-13 DIAGNOSIS — M6281 Muscle weakness (generalized): Secondary | ICD-10-CM | POA: Diagnosis not present

## 2019-03-13 NOTE — Therapy (Signed)
[place] MAIN [facility] SERVICES [address] [place], [place], [address] Phone: [phone]   Fax:  [phone]  Physical Therapy Treatment  Patient Details  Name: Cameron Foster. MRN: [id number] Date of Birth: 1941-03-23 Referring Provider (PT): Lavon Paganini   Encounter Date: 03/13/2019  PT End of Session - 03/13/19 1438    Visit Number  6    Number of Visits  17    Date for PT Re-Evaluation  04/15/19    PT Start Time  1430    PT Stop Time  1510    PT Time Calculation (min)  40 min    Equipment Utilized During Treatment  Gait belt    Activity Tolerance  Patient tolerated treatment well;No increased pain    Behavior During Therapy  WFL for tasks assessed/performed       Past Medical History:  Diagnosis Date  . Alcoholism ([place])   . Arthritis   . Atrial fibrillation ([place])    one episode  . Colon polyp   . Depression    Son died 2014-06-22  . Diverticulitis   . Diverticulosis 30 years  . Dysrhythmia    At Fib 1995  . GERD (gastroesophageal reflux disease)   . Hyperlipidemia   . Hypertension   . Irritable bowel syndrome   . Prostate cancer ([place]) 06/22/2010   treated with radiation therapy. Prostate  . Vasovagal syncope     Past Surgical History:  Procedure Laterality Date  . [place] no stents  . CATARACT EXTRACTION, BILATERAL    . COLON RESECTION SIGMOID N/A 12/07/2016   Procedure: COLON RESECTION SIGMOID;  Surgeon: Clayburn Pert, MD;  Location: [facility] ORS;  Service: General;  Laterality: N/A;  . COLON SURGERY  11/2016   Colostomy  . COLONOSCOPY  Jun 22, 2014  . COLONOSCOPY WITH PROPOFOL N/A 05/30/2016   Procedure: COLONOSCOPY WITH PROPOFOL;  Surgeon: Lollie Sails, MD;  Location: [facility] ENDOSCOPY;  Service: Endoscopy;  Laterality: N/A;  . COLOSTOMY Left 12/07/2016   Procedure: COLOSTOMY;  Surgeon: Clayburn Pert, MD;  Location: [facility] ORS;  Service: General;  Laterality: Left;  . COLOSTOMY  REVERSAL N/A 03/21/2017   Procedure: COLOSTOMY REVERSAL;  Surgeon: Clayburn Pert, MD;  Location: [facility] ORS;  Service: General;  Laterality: N/A;  . COLOSTOMY TAKEDOWN N/A 03/21/2017   Procedure: LAPAROSCOPIC COLOSTOMY TAKEDOWN;  Surgeon: Clayburn Pert, MD;  Location: [facility] ORS;  Service: General;  Laterality: N/A;  . CYSTOSCOPY WITH STENT PLACEMENT Bilateral 03/21/2017   Procedure: CYSTOSCOPY WITH LIGHTED STENT PLACEMENT;  Surgeon: Abbie Sons, MD;  Location: [facility] ORS;  Service: Urology;  Laterality: Bilateral;  . ESOPHAGOGASTRODUODENOSCOPY    . ESOPHAGOGASTRODUODENOSCOPY N/A 05/30/2016   Procedure: ESOPHAGOGASTRODUODENOSCOPY (EGD);  Surgeon: Lollie Sails, MD;  Location: [facility] ENDOSCOPY;  Service: Endoscopy;  Laterality: N/A;  . EYE SURGERY     cataracts  . FLEXIBLE SIGMOIDOSCOPY N/A 03/21/2017   Procedure: FLEXIBLE SIGMOIDOSCOPY;  Surgeon: Clayburn Pert, MD;  Location: [facility] ORS;  Service: General;  Laterality: N/A;  . FRACTURE SURGERY Bilateral    right arm and left wrist  . INCISION AND DRAINAGE ABSCESS N/A 12/07/2016   Procedure: DRAINAGE  OF INTRA ABDOMINAL ABSCESS;  Surgeon: Clayburn Pert, MD;  Location: [facility] ORS;  Service: General;  Laterality: N/A;  . KNEE ARTHROSCOPY    . LAPAROTOMY N/A 12/07/2016   Procedure: EXPLORATORY LAPAROTOMY;  Surgeon: Clayburn Pert, MD;  Location: [facility] ORS;  Service: General;  Laterality: N/A;  . PROSTATE SURGERY  Microwave therapy  . TONSILLECTOMY    . TOTAL KNEE ARTHROPLASTY Right 07/27/2016   Procedure: TOTAL KNEE ARTHROPLASTY;  Surgeon: [name], MD;  Location: [facility] ORS;  Service: Orthopedics;  Laterality: Right;  Dr. Erlene Quan had to place Urinary catheter due to prostate cancer history.  Using flexible scope.    There were no vitals filed for this visit.  Subjective Assessment - 03/13/19 1433    Subjective  Pt doing ok, in general, exercises at home not being performed as patient doesn't se how tey would help his ballance. He  continue sto pace himself to avoid LOB.    Pertinent History  Patient had a TIA 6/20 and was at [facility] for 3 days. He is having balance instabiity and is having difficulty with walking.Pateint has bursitis in right hip, he has back pain that is intermittent and begins when he stands up. He had vertigo that started up again last week. It is intermittent. and today it is gone.    Currently in Pain?  No/denies   just general aches adn pains, chornic unexacerbated.       INTERVENTION THIS DATE   Neuromuscular Re-education Rocker board fwd/bwd, side to side x 20 each direction Tandem gaiton 2"x4"without UE support x2lengths Side stepping on 2"x4" without UE support x 2 lengths Tandem stance on board 3x30sec bilat  Airex pad normal stance 5x30sec eyes closed Ariex normal stance overhead gaze 2x10  Airex pad narrow stance head turns horizontal, alternating positions 2x10  Marching in place, high knees, hands free (chair as needed) 1x20 alternating  Standing heel raises1x15 bilat, VC for full ROM, BUE support Suicides c 3 cones 2 rounds, 2nd round with red mat: min guard assist.  *pt SOB, requires resting break seated.         PT Short Term Goals - 02/18/19 1644      PT SHORT TERM GOAL #1   Title  Patient will be independent in home exercise program to improve strength/mobility for better functional independence with ADLs.    Time  4    Period  Weeks    Status  New    Target Date  03/18/19      PT SHORT TERM GOAL #2   Title  Patient will increase Berg Balance score by > 6 points to demonstrate decreased fall risk during functional activities.    Baseline  42/56    Time  4        PT Long Term Goals - 02/18/19 1729      PT LONG TERM GOAL #1   Title  Patient will increase ABC scale score >80% to demonstrate better functional mobility and better confidence with ADLs.    Time  8    Period  Weeks    Status  New    Target Date  04/15/19      PT LONG TERM GOAL #2   Title   Patient will increase LEFS score 8 points to demonstrate better functional mobility    Time  8    Period  Weeks    Status  New    Target Date  04/15/19      PT LONG TERM GOAL #3   Title  Patient will demonstrate an improved Berg Balance Score of  42/56 > as to demonstrate improved balance with ADLs such as sitting/standing and transfer balance and reduced fall risk.    Time  8    Period  Weeks    Status  New  Target Date  04/15/19            Plan - 03/13/19 1438    Clinical Impression Statement  In general, patient demonstrating good tolerance to therapy session this date, reasonable accommodations are alllowed in-session to allow adequate rest between activities as needed. All interventional executed without any exacerbation of pain or other symptoms.  Pt given intermittent multimodal cues to teach best possible form with exercises. Pt continues to make steady progress toward most goals. No home exercise updates made at this time. Pt equally limited by fatigue as gait instability, but program advanced to meet pt at his current limitations.    Stability/Clinical Decision Making  Stable/Uncomplicated    Clinical Decision Making  Low    Rehab Potential  Good    PT Frequency  2x / week    PT Duration  8 weeks    PT Treatment/Interventions  Canalith Repostioning;Neuromuscular re-education;Balance training;Therapeutic exercise;Patient/family education;Therapeutic activities;Passive range of motion    PT Next Visit Plan  balance, therapeutic exercise    PT Home Exercise Plan  heel raises, squats       Patient will benefit from skilled therapeutic intervention in order to improve the following deficits and impairments:  Impaired flexibility, Difficulty walking, Decreased safety awareness, Decreased balance, Abnormal gait, Decreased activity tolerance, Decreased endurance, Decreased range of motion, Pain  Visit Diagnosis: Difficulty in walking, not elsewhere classified  Muscle  weakness (generalized)     Problem List Patient Active Problem List   Diagnosis Date Noted  . Diplopia 11/07/2018  . Paroxysmal atrial fibrillation ([place]) 11/07/2018  . TIA (transient ischemic attack) 10/20/2018  . SAH (subarachnoid hemorrhage) ([place]) 10/16/2018  . Recurrent sinusitis 10/16/2018  . Low libido 08/30/2018  . Class 1 obesity without serious comorbidity with body mass index (BMI) of 31.0 to 31.9 in adult 05/14/2018  . Mixed hyperlipidemia 05/14/2018  . Degeneration of lumbar intervertebral disc 03/22/2018  . Osteoarthritis of hip 03/22/2018  . Trochanteric bursitis of right hip 03/22/2018  . Status post partial colectomy 01/03/2017  . Depression, major, single episode, mild ([place]) 01/03/2017  . Vasovagal syncope 01/27/2016  . History of skin cancer 10/28/2015  . Osteoarthritis of right knee 10/01/2015  . Essential hypertension 08/25/2015  . Dyspnea on exertion 08/12/2015  . Erectile dysfunction following radiation therapy 08/04/2015  . Constipation 08/02/2015  . History of prostate cancer 01/20/2015  . Swelling of right lower extremity 01/20/2015   3:12 PM, 03/13/19 Etta Grandchild, PT, DPT Physical Therapist - [place] [phone]     Etta Grandchild 03/13/2019, 2:40 PM  [place] MAIN [facility] SERVICES [address] [place], [place], [address] Phone: [phone]   Fax:  [phone]  Name: Demarkus Remmel. MRN: [id number] Date of Birth: 02/24/1941

## 2019-03-18 ENCOUNTER — Ambulatory Visit: Payer: PRIVATE HEALTH INSURANCE | Admitting: Physical Therapy

## 2019-03-18 DIAGNOSIS — E538 Deficiency of other specified B group vitamins: Secondary | ICD-10-CM | POA: Diagnosis not present

## 2019-03-18 DIAGNOSIS — E782 Mixed hyperlipidemia: Secondary | ICD-10-CM | POA: Diagnosis not present

## 2019-03-18 DIAGNOSIS — Z8673 Personal history of transient ischemic attack (TIA), and cerebral infarction without residual deficits: Secondary | ICD-10-CM | POA: Diagnosis not present

## 2019-03-18 DIAGNOSIS — R2689 Other abnormalities of gait and mobility: Secondary | ICD-10-CM | POA: Diagnosis not present

## 2019-03-18 DIAGNOSIS — H532 Diplopia: Secondary | ICD-10-CM | POA: Diagnosis not present

## 2019-03-20 ENCOUNTER — Ambulatory Visit: Payer: Medicare Other | Admitting: Physical Therapy

## 2019-03-22 DIAGNOSIS — E538 Deficiency of other specified B group vitamins: Secondary | ICD-10-CM | POA: Diagnosis not present

## 2019-03-22 LAB — LIPID PANEL
Cholesterol: 194 (ref 0–200)
HDL: 44 (ref 35–70)
LDL Cholesterol: 107
Triglycerides: 214 — AB (ref 40–160)

## 2019-03-22 LAB — VITAMIN B12: Vitamin B-12: 459

## 2019-03-25 ENCOUNTER — Other Ambulatory Visit: Payer: Self-pay

## 2019-03-25 ENCOUNTER — Ambulatory Visit: Payer: Medicare Other | Attending: Neurology

## 2019-03-25 DIAGNOSIS — R262 Difficulty in walking, not elsewhere classified: Secondary | ICD-10-CM | POA: Insufficient documentation

## 2019-03-25 DIAGNOSIS — M6281 Muscle weakness (generalized): Secondary | ICD-10-CM | POA: Diagnosis present

## 2019-03-25 NOTE — Therapy (Signed)
[place] MAIN [facility] SERVICES [address] [place], [place], [address] Phone: [phone]   Fax:  [phone]  Physical Therapy Treatment  Patient Details  Name: Cameron Foster. MRN: [id number] Date of Birth: 29-Sep-1940 Referring Provider (PT): Lavon Paganini   Encounter Date: 03/25/2019  PT End of Session - 03/25/19 1435    Visit Number  7    Number of Visits  17    Date for PT Re-Evaluation  04/15/19    PT Start Time  1430    PT Stop Time  1515    PT Time Calculation (min)  45 min    Equipment Utilized During Treatment  Gait belt    Activity Tolerance  Patient tolerated treatment well;No increased pain    Behavior During Therapy  WFL for tasks assessed/performed       Past Medical History:  Diagnosis Date  . Alcoholism ([place])   . Arthritis   . Atrial fibrillation ([place])    one episode  . Colon polyp   . Depression    Son died July 08, 2014  . Diverticulitis   . Diverticulosis 30 years  . Dysrhythmia    At Fib 1995  . GERD (gastroesophageal reflux disease)   . Hyperlipidemia   . Hypertension   . Irritable bowel syndrome   . Prostate cancer ([place]) Jul 08, 2010   treated with radiation therapy. Prostate  . Vasovagal syncope     Past Surgical History:  Procedure Laterality Date  . [place] no stents  . CATARACT EXTRACTION, BILATERAL    . COLON RESECTION SIGMOID N/A 12/07/2016   Procedure: COLON RESECTION SIGMOID;  Surgeon: Clayburn Pert, MD;  Location: [facility] ORS;  Service: General;  Laterality: N/A;  . COLON SURGERY  11/2016   Colostomy  . COLONOSCOPY  2014/07/08  . COLONOSCOPY WITH PROPOFOL N/A 05/30/2016   Procedure: COLONOSCOPY WITH PROPOFOL;  Surgeon: Lollie Sails, MD;  Location: [facility] ENDOSCOPY;  Service: Endoscopy;  Laterality: N/A;  . COLOSTOMY Left 12/07/2016   Procedure: COLOSTOMY;  Surgeon: Clayburn Pert, MD;  Location: [facility] ORS;  Service: General;  Laterality: Left;  . COLOSTOMY  REVERSAL N/A 03/21/2017   Procedure: COLOSTOMY REVERSAL;  Surgeon: Clayburn Pert, MD;  Location: [facility] ORS;  Service: General;  Laterality: N/A;  . COLOSTOMY TAKEDOWN N/A 03/21/2017   Procedure: LAPAROSCOPIC COLOSTOMY TAKEDOWN;  Surgeon: Clayburn Pert, MD;  Location: [facility] ORS;  Service: General;  Laterality: N/A;  . CYSTOSCOPY WITH STENT PLACEMENT Bilateral 03/21/2017   Procedure: CYSTOSCOPY WITH LIGHTED STENT PLACEMENT;  Surgeon: Abbie Sons, MD;  Location: [facility] ORS;  Service: Urology;  Laterality: Bilateral;  . ESOPHAGOGASTRODUODENOSCOPY    . ESOPHAGOGASTRODUODENOSCOPY N/A 05/30/2016   Procedure: ESOPHAGOGASTRODUODENOSCOPY (EGD);  Surgeon: Lollie Sails, MD;  Location: [facility] ENDOSCOPY;  Service: Endoscopy;  Laterality: N/A;  . EYE SURGERY     cataracts  . FLEXIBLE SIGMOIDOSCOPY N/A 03/21/2017   Procedure: FLEXIBLE SIGMOIDOSCOPY;  Surgeon: Clayburn Pert, MD;  Location: [facility] ORS;  Service: General;  Laterality: N/A;  . FRACTURE SURGERY Bilateral    right arm and left wrist  . INCISION AND DRAINAGE ABSCESS N/A 12/07/2016   Procedure: DRAINAGE  OF INTRA ABDOMINAL ABSCESS;  Surgeon: Clayburn Pert, MD;  Location: [facility] ORS;  Service: General;  Laterality: N/A;  . KNEE ARTHROSCOPY    . LAPAROTOMY N/A 12/07/2016   Procedure: EXPLORATORY LAPAROTOMY;  Surgeon: Clayburn Pert, MD;  Location: [facility] ORS;  Service: General;  Laterality: N/A;  . PROSTATE SURGERY  Microwave therapy  . TONSILLECTOMY    . TOTAL KNEE ARTHROPLASTY Right 07/27/2016   Procedure: TOTAL KNEE ARTHROPLASTY;  Surgeon: [name], MD;  Location: [facility] ORS;  Service: Orthopedics;  Laterality: Right;  Dr. Erlene Quan had to place Urinary catheter due to prostate cancer history.  Using flexible scope.    There were no vitals filed for this visit.  Subjective Assessment - 03/25/19 1434    Subjective  Patient reported that he is doing well today, no falls or stumbles since last visit. Pt has attempted his new exercises at  home.    Pertinent History  Patient had a TIA 6/20 and was at [facility] for 3 days. He is having balance instabiity and is having difficulty with walking.Pateint has bursitis in right hip, he has back pain that is intermittent and begins when he stands up. He had vertigo that started up again last week. It is intermittent. and today it is gone.    Limitations  Walking    How long can you stand comfortably?  20 mins    How long can you walk comfortably?  20 mins    Patient Stated Goals  to have better balance    Currently in Pain?  No/denies       Neuromuscular Re-education  scifit x 5 mins (unbilled) Rocker board fwd/bwd, side to side x 20 each direction Tandem gait on 2"x4" without UE support x 6 lengths Side stepping on 2"x4" without UE support x 6 lengths Tandem stance on board 3x30sec bilat  Airex pad normal stance 4x30sec eyes closed Ariex normal stance vertical head turns/horizontal head truns x10 ea direction  Airex pad narrow stance head turns horizontal, alternating positions x10  Marching in place, high knees, hands free (chair as needed) 1x20 alternating  Resisted ambulation 12.5# 3 laps minA for retroambulation *pt SOB, requires resting break seated.   Pt response/clinical impression: The patient was most challenged by narrow base of support, CGA-minA provided throughout session to increase safety. The patient also demonstrated unsteadiness with resisted ambulation especially retroambulation, some mild improvement noted when patient cued for activity pacing and stride length. The patient would benefit from further skilled PT intervention to progress towards goals as able.     PT Education - 03/25/19 1435    Education Details  HEP, exercise/technique    Person(s) Educated  Patient    Methods  Explanation;Demonstration    Comprehension  Verbalized understanding;Returned demonstration;Verbal cues required;Tactile cues required;Need further instruction       PT Short Term Goals  - 02/18/19 1644      PT SHORT TERM GOAL #1   Title  Patient will be independent in home exercise program to improve strength/mobility for better functional independence with ADLs.    Time  4    Period  Weeks    Status  New    Target Date  03/18/19      PT SHORT TERM GOAL #2   Title  Patient will increase Berg Balance score by > 6 points to demonstrate decreased fall risk during functional activities.    Baseline  42/56    Time  4        PT Long Term Goals - 02/18/19 1729      PT LONG TERM GOAL #1   Title  Patient will increase ABC scale score >80% to demonstrate better functional mobility and better confidence with ADLs.    Time  8    Period  Weeks    Status  New  Target Date  04/15/19      PT LONG TERM GOAL #2   Title  Patient will increase LEFS score 8 points to demonstrate better functional mobility    Time  8    Period  Weeks    Status  New    Target Date  04/15/19      PT LONG TERM GOAL #3   Title  Patient will demonstrate an improved Berg Balance Score of  42/56 > as to demonstrate improved balance with ADLs such as sitting/standing and transfer balance and reduced fall risk.    Time  8    Period  Weeks    Status  New    Target Date  04/15/19            Plan - 03/26/19 1556    Personal Factors and Comorbidities  Age;Comorbidity 1    Comorbidities  back pain, prostate cancer, knee replacement,    Examination-Activity Limitations  Carry;Dressing    Examination-Participation Restrictions  Yard Work;Church    Rehab Potential  Good    PT Frequency  2x / week    PT Duration  8 weeks    PT Treatment/Interventions  Canalith Repostioning;Neuromuscular re-education;Balance training;Therapeutic exercise;Patient/family education;Therapeutic activities;Passive range of motion    PT Next Visit Plan  balance, therapeutic exercise    PT Home Exercise Plan  heel raises, squats    Consulted and Agree with Plan of Care  Patient       Patient will benefit from  skilled therapeutic intervention in order to improve the following deficits and impairments:  Impaired flexibility, Difficulty walking, Decreased safety awareness, Decreased balance, Abnormal gait, Decreased activity tolerance, Decreased endurance, Decreased range of motion, Pain  Visit Diagnosis: Difficulty in walking, not elsewhere classified  Muscle weakness (generalized)     Problem List Patient Active Problem List   Diagnosis Date Noted  . Diplopia 11/07/2018  . Paroxysmal atrial fibrillation ([place]) 11/07/2018  . TIA (transient ischemic attack) 10/20/2018  . SAH (subarachnoid hemorrhage) ([place]) 10/16/2018  . Recurrent sinusitis 10/16/2018  . Low libido 08/30/2018  . Class 1 obesity without serious comorbidity with body mass index (BMI) of 31.0 to 31.9 in adult 05/14/2018  . Mixed hyperlipidemia 05/14/2018  . Degeneration of lumbar intervertebral disc 03/22/2018  . Osteoarthritis of hip 03/22/2018  . Trochanteric bursitis of right hip 03/22/2018  . Status post partial colectomy 01/03/2017  . Depression, major, single episode, mild ([place]) 01/03/2017  . Vasovagal syncope 01/27/2016  . History of skin cancer 10/28/2015  . Osteoarthritis of right knee 10/01/2015  . Essential hypertension 08/25/2015  . Dyspnea on exertion 08/12/2015  . Erectile dysfunction following radiation therapy 08/04/2015  . Constipation 08/02/2015  . History of prostate cancer 01/20/2015  . Swelling of right lower extremity 01/20/2015    Lieutenant Diego PT, DPT 4:04 PM,03/26/19 [place] MAIN [facility] SERVICES [address] [place], [place], [address] Phone: [phone]   Fax:  [phone]  Name: Cameron Foster. MRN: [id number] Date of Birth: 1940-05-25

## 2019-03-27 ENCOUNTER — Encounter: Payer: Self-pay | Admitting: Physical Therapy

## 2019-03-27 ENCOUNTER — Ambulatory Visit: Payer: Medicare Other | Admitting: Physical Therapy

## 2019-03-27 ENCOUNTER — Other Ambulatory Visit: Payer: Self-pay

## 2019-03-27 DIAGNOSIS — R262 Difficulty in walking, not elsewhere classified: Secondary | ICD-10-CM

## 2019-03-27 DIAGNOSIS — M6281 Muscle weakness (generalized): Secondary | ICD-10-CM

## 2019-03-27 NOTE — Therapy (Signed)
[place] MAIN [facility] SERVICES [address] [place], [place], [address] Phone: [phone]   Fax:  [phone]  Physical Therapy Treatment  Patient Details  Name: Cameron Foster. MRN: [id number] Date of Birth: [birth year] Referring Provider (PT): Lavon Paganini   Encounter Date: 03/27/2019  PT End of Session - 03/27/19 1439    Visit Number  8    Number of Visits  17    Date for PT Re-Evaluation  04/15/19    PT Start Time  1430    PT Stop Time  1510    PT Time Calculation (min)  40 min    Equipment Utilized During Treatment  Gait belt    Activity Tolerance  Patient tolerated treatment well;No increased pain    Behavior During Therapy  WFL for tasks assessed/performed       Past Medical History:  Diagnosis Date  . Alcoholism ([place])   . Arthritis   . Atrial fibrillation ([place])    one episode  . Colon polyp   . Depression    Son died 2014-07-03  . Diverticulitis   . Diverticulosis 30 years  . Dysrhythmia    At Fib 1995  . GERD (gastroesophageal reflux disease)   . Hyperlipidemia   . Hypertension   . Irritable bowel syndrome   . Prostate cancer ([place]) Jul 03, 2010   treated with radiation therapy. Prostate  . Vasovagal syncope     Past Surgical History:  Procedure Laterality Date  . [place] no stents  . CATARACT EXTRACTION, BILATERAL    . COLON RESECTION SIGMOID N/A 12/07/2016   Procedure: COLON RESECTION SIGMOID;  Surgeon: Clayburn Pert, MD;  Location: [facility] ORS;  Service: General;  Laterality: N/A;  . COLON SURGERY  11/2016   Colostomy  . COLONOSCOPY  07-03-2014  . COLONOSCOPY WITH PROPOFOL N/A 05/30/2016   Procedure: COLONOSCOPY WITH PROPOFOL;  Surgeon: Lollie Sails, MD;  Location: [facility] ENDOSCOPY;  Service: Endoscopy;  Laterality: N/A;  . COLOSTOMY Left 12/07/2016   Procedure: COLOSTOMY;  Surgeon: Clayburn Pert, MD;  Location: [facility] ORS;  Service: General;  Laterality: Left;  . COLOSTOMY  REVERSAL N/A 03/21/2017   Procedure: COLOSTOMY REVERSAL;  Surgeon: Clayburn Pert, MD;  Location: [facility] ORS;  Service: General;  Laterality: N/A;  . COLOSTOMY TAKEDOWN N/A 03/21/2017   Procedure: LAPAROSCOPIC COLOSTOMY TAKEDOWN;  Surgeon: Clayburn Pert, MD;  Location: [facility] ORS;  Service: General;  Laterality: N/A;  . CYSTOSCOPY WITH STENT PLACEMENT Bilateral 03/21/2017   Procedure: CYSTOSCOPY WITH LIGHTED STENT PLACEMENT;  Surgeon: Abbie Sons, MD;  Location: [facility] ORS;  Service: Urology;  Laterality: Bilateral;  . ESOPHAGOGASTRODUODENOSCOPY    . ESOPHAGOGASTRODUODENOSCOPY N/A 05/30/2016   Procedure: ESOPHAGOGASTRODUODENOSCOPY (EGD);  Surgeon: Lollie Sails, MD;  Location: [facility] ENDOSCOPY;  Service: Endoscopy;  Laterality: N/A;  . EYE SURGERY     cataracts  . FLEXIBLE SIGMOIDOSCOPY N/A 03/21/2017   Procedure: FLEXIBLE SIGMOIDOSCOPY;  Surgeon: Clayburn Pert, MD;  Location: [facility] ORS;  Service: General;  Laterality: N/A;  . FRACTURE SURGERY Bilateral    right arm and left wrist  . INCISION AND DRAINAGE ABSCESS N/A 12/07/2016   Procedure: DRAINAGE  OF INTRA ABDOMINAL ABSCESS;  Surgeon: Clayburn Pert, MD;  Location: [facility] ORS;  Service: General;  Laterality: N/A;  . KNEE ARTHROSCOPY    . LAPAROTOMY N/A 12/07/2016   Procedure: EXPLORATORY LAPAROTOMY;  Surgeon: Clayburn Pert, MD;  Location: [facility] ORS;  Service: General;  Laterality: N/A;  . PROSTATE SURGERY  Microwave therapy  . TONSILLECTOMY    . TOTAL KNEE ARTHROPLASTY Right 07/27/2016   Procedure: TOTAL KNEE ARTHROPLASTY;  Surgeon: [name], MD;  Location: [facility] ORS;  Service: Orthopedics;  Laterality: Right;  Dr. Erlene Quan had to place Urinary catheter due to prostate cancer history.  Using flexible scope.    There were no vitals filed for this visit.  Subjective Assessment - 03/27/19 1438    Subjective  Patient reported that he is doing well today, no falls or stumbles since last visit.    Pertinent History  Patient had a  TIA 6/20 and was at [facility] for 3 days. He is having balance instabiity and is having difficulty with walking.Pateint has bursitis in right hip, he has back pain that is intermittent and begins when he stands up. He had vertigo that started up again last week. It is intermittent. and today it is gone.    Limitations  Walking    How long can you stand comfortably?  20 mins    How long can you walk comfortably?  20 mins    Patient Stated Goals  to have better balance          Treatment: Leg press 130 lbs x 20 x 2  Heel raises x 60 x 20 x 3   Neuromuscular Training: Stool: staggered stance, head turns side/side, up/down x 5 reps each, each foot in front; VCs for proper technique and positioning for each exercise staggered stance, trunk rotation with 2 lb rod, VC to keep UE straight and turn head with trunk   Hurdle: Forward and backward stepping over hurdle x15 each direction, VCs to take big enough steps and to try to increase speed to work on coordination  Side stepping over hurdle 15x each direction  Blue Foam: Side stepping x10 on blue balance CGA for safety, VCs for taking a big enough step  Airex pad trunk rotation x2 min, CGA for safety, demonstrated difficulty with keeping arms extended and full rotation with head turn Airex pad, balloon tapping to mirror x2 min, supervision for safety with varying directions and speed of balloon, VCs for utilizing both hands and minimizing UE support  Floor Star exercise: Performed stepping on the star diagram on the floor. Working on weight-shifting forward onto the foot that stepped forward and then weight-shifting back and bringing her feet back together with CGA. Performed 2 reps each foot.   Matrix: Fwd/bwd gait with 22. 5 lbs and CGA, cues for posture and stepping strategies, occasional LOB Side stepping left and right and CGA with cues to slow movement    Pt educated throughout session about proper posture and technique with exercises.  Improved exercise technique, movement at target joints, use of target muscles after min to mod verbal, visual, tactile cues. CGA and Min to mod verbal cues used throughout with increased in postural sway and LOB most seen with narrow base of support and while on uneven surfaces. Continues to have balance deficits typical with diagnosis. Patient performs intermediate level exercises without pain behaviors and needs verbal cuing for postural alignment and head positioning Tactile cues and assistance needed to keep lower leg and knee in neutral to avoid compensations with ankle motions.                      PT Education - 03/27/19 1438    Education Details  HEP    Person(s) Educated  Patient    Methods  Explanation;Demonstration;Tactile cues;Verbal cues  Comprehension  Verbalized understanding;Returned demonstration;Verbal cues required;Tactile cues required;Need further instruction       PT Short Term Goals - 02/18/19 1644      PT SHORT TERM GOAL #1   Title  Patient will be independent in home exercise program to improve strength/mobility for better functional independence with ADLs.    Time  4    Period  Weeks    Status  New    Target Date  03/18/19      PT SHORT TERM GOAL #2   Title  Patient will increase Berg Balance score by > 6 points to demonstrate decreased fall risk during functional activities.    Baseline  42/56    Time  4        PT Long Term Goals - 02/18/19 1729      PT LONG TERM GOAL #1   Title  Patient will increase ABC scale score >80% to demonstrate better functional mobility and better confidence with ADLs.    Time  8    Period  Weeks    Status  New    Target Date  04/15/19      PT LONG TERM GOAL #2   Title  Patient will increase LEFS score 8 points to demonstrate better functional mobility    Time  8    Period  Weeks    Status  New    Target Date  04/15/19      PT LONG TERM GOAL #3   Title  Patient will demonstrate an improved Berg  Balance Score of  42/56 > as to demonstrate improved balance with ADLs such as sitting/standing and transfer balance and reduced fall risk.    Time  8    Period  Weeks    Status  New    Target Date  04/15/19            Plan - 03/27/19 1439    Clinical Impression Statement  Pt demonstrates increased postural sway when standing on uneven surface and requires // bars to steady, and demonstrates fatigue at end of set of exercises focused on strength and endurance.  Patient will continue to benefit from skilled PT for improved balance and strength.    Personal Factors and Comorbidities  Age;Comorbidity 1    Comorbidities  back pain, prostate cancer, knee replacement,    Examination-Activity Limitations  Carry;Dressing    Examination-Participation Restrictions  Yard Work;Church    Rehab Potential  Good    PT Frequency  2x / week    PT Duration  8 weeks    PT Treatment/Interventions  Canalith Repostioning;Neuromuscular re-education;Balance training;Therapeutic exercise;Patient/family education;Therapeutic activities;Passive range of motion    PT Next Visit Plan  balance, therapeutic exercise    PT Home Exercise Plan  heel raises, squats    Consulted and Agree with Plan of Care  Patient       Patient will benefit from skilled therapeutic intervention in order to improve the following deficits and impairments:  Impaired flexibility, Difficulty walking, Decreased safety awareness, Decreased balance, Abnormal gait, Decreased activity tolerance, Decreased endurance, Decreased range of motion, Pain  Visit Diagnosis: Muscle weakness (generalized)  Difficulty in walking, not elsewhere classified     Problem List Patient Active Problem List   Diagnosis Date Noted  . Diplopia 11/07/2018  . Paroxysmal atrial fibrillation ([place]) 11/07/2018  . TIA (transient ischemic attack) 10/20/2018  . SAH (subarachnoid hemorrhage) ([place]) 10/16/2018  . Recurrent sinusitis 10/16/2018  . Low libido  08/30/2018  .  Class 1 obesity without serious comorbidity with body mass index (BMI) of 31.0 to 31.9 in adult 05/14/2018  . Mixed hyperlipidemia 05/14/2018  . Degeneration of lumbar intervertebral disc 03/22/2018  . Osteoarthritis of hip 03/22/2018  . Trochanteric bursitis of right hip 03/22/2018  . Status post partial colectomy 01/03/2017  . Depression, major, single episode, mild ([place]) 01/03/2017  . Vasovagal syncope 01/27/2016  . History of skin cancer 10/28/2015  . Osteoarthritis of right knee 10/01/2015  . Essential hypertension 08/25/2015  . Dyspnea on exertion 08/12/2015  . Erectile dysfunction following radiation therapy 08/04/2015  . Constipation 08/02/2015  . History of prostate cancer 01/20/2015  . Swelling of right lower extremity 01/20/2015    Alanson Puls , [place] DPT 03/27/2019, 2:40 PM  [place] MAIN [facility] SERVICES [address] [place], [place], [address] Phone: [phone]   Fax:  [phone]  Name: Cameron Foster. MRN: [id number] Date of Birth: Sep 25, 1940

## 2019-04-01 ENCOUNTER — Ambulatory Visit: Payer: Medicare Other | Admitting: Physical Therapy

## 2019-04-03 ENCOUNTER — Encounter: Payer: Self-pay | Admitting: Physical Therapy

## 2019-04-03 ENCOUNTER — Ambulatory Visit: Payer: Medicare Other | Admitting: Physical Therapy

## 2019-04-03 ENCOUNTER — Other Ambulatory Visit: Payer: Self-pay

## 2019-04-03 DIAGNOSIS — R262 Difficulty in walking, not elsewhere classified: Secondary | ICD-10-CM

## 2019-04-03 DIAGNOSIS — M6281 Muscle weakness (generalized): Secondary | ICD-10-CM

## 2019-04-03 NOTE — Therapy (Signed)
[place] MAIN [facility] SERVICES [address] [place], [place], [address] Phone: [phone]   Fax:  [phone]  Physical Therapy Treatment  Patient Details  Name: Cameron Foster. MRN: [id number] Date of Birth: 01/08/1941 Referring Provider (PT): Lavon Paganini   Encounter Date: 04/03/2019  PT End of Session - 04/03/19 1359    Visit Number  9    Number of Visits  17    Date for PT Re-Evaluation  04/15/19    PT Start Time  1330    PT Stop Time  1410    PT Time Calculation (min)  40 min    Equipment Utilized During Treatment  Gait belt    Activity Tolerance  Patient tolerated treatment well;No increased pain    Behavior During Therapy  WFL for tasks assessed/performed       Past Medical History:  Diagnosis Date  . Alcoholism ([place])   . Arthritis   . Atrial fibrillation ([place])    one episode  . Colon polyp   . Depression    Son died 17-Jun-2014  . Diverticulitis   . Diverticulosis 30 years  . Dysrhythmia    At Fib 1995  . GERD (gastroesophageal reflux disease)   . Hyperlipidemia   . Hypertension   . Irritable bowel syndrome   . Prostate cancer ([place]) Jun 17, 2010   treated with radiation therapy. Prostate  . Vasovagal syncope     Past Surgical History:  Procedure Laterality Date  . [place] no stents  . CATARACT EXTRACTION, BILATERAL    . COLON RESECTION SIGMOID N/A 12/07/2016   Procedure: COLON RESECTION SIGMOID;  Surgeon: Clayburn Pert, MD;  Location: [facility] ORS;  Service: General;  Laterality: N/A;  . COLON SURGERY  11/2016   Colostomy  . COLONOSCOPY  17-Jun-2014  . COLONOSCOPY WITH PROPOFOL N/A 05/30/2016   Procedure: COLONOSCOPY WITH PROPOFOL;  Surgeon: Lollie Sails, MD;  Location: [facility] ENDOSCOPY;  Service: Endoscopy;  Laterality: N/A;  . COLOSTOMY Left 12/07/2016   Procedure: COLOSTOMY;  Surgeon: Clayburn Pert, MD;  Location: [facility] ORS;  Service: General;  Laterality: Left;  . COLOSTOMY  REVERSAL N/A 03/21/2017   Procedure: COLOSTOMY REVERSAL;  Surgeon: Clayburn Pert, MD;  Location: [facility] ORS;  Service: General;  Laterality: N/A;  . COLOSTOMY TAKEDOWN N/A 03/21/2017   Procedure: LAPAROSCOPIC COLOSTOMY TAKEDOWN;  Surgeon: Clayburn Pert, MD;  Location: [facility] ORS;  Service: General;  Laterality: N/A;  . CYSTOSCOPY WITH STENT PLACEMENT Bilateral 03/21/2017   Procedure: CYSTOSCOPY WITH LIGHTED STENT PLACEMENT;  Surgeon: Abbie Sons, MD;  Location: [facility] ORS;  Service: Urology;  Laterality: Bilateral;  . ESOPHAGOGASTRODUODENOSCOPY    . ESOPHAGOGASTRODUODENOSCOPY N/A 05/30/2016   Procedure: ESOPHAGOGASTRODUODENOSCOPY (EGD);  Surgeon: Lollie Sails, MD;  Location: [facility] ENDOSCOPY;  Service: Endoscopy;  Laterality: N/A;  . EYE SURGERY     cataracts  . FLEXIBLE SIGMOIDOSCOPY N/A 03/21/2017   Procedure: FLEXIBLE SIGMOIDOSCOPY;  Surgeon: Clayburn Pert, MD;  Location: [facility] ORS;  Service: General;  Laterality: N/A;  . FRACTURE SURGERY Bilateral    right arm and left wrist  . INCISION AND DRAINAGE ABSCESS N/A 12/07/2016   Procedure: DRAINAGE  OF INTRA ABDOMINAL ABSCESS;  Surgeon: Clayburn Pert, MD;  Location: [facility] ORS;  Service: General;  Laterality: N/A;  . KNEE ARTHROSCOPY    . LAPAROTOMY N/A 12/07/2016   Procedure: EXPLORATORY LAPAROTOMY;  Surgeon: Clayburn Pert, MD;  Location: [facility] ORS;  Service: General;  Laterality: N/A;  . PROSTATE SURGERY  Microwave therapy  . TONSILLECTOMY    . TOTAL KNEE ARTHROPLASTY Right 07/27/2016   Procedure: TOTAL KNEE ARTHROPLASTY;  Surgeon: [name], MD;  Location: [facility] ORS;  Service: Orthopedics;  Laterality: Right;  Dr. Erlene Quan had to place Urinary catheter due to prostate cancer history.  Using flexible scope.    There were no vitals filed for this visit.  Subjective Assessment - 04/03/19 1357    Subjective  Patient reported that he is having right hip pain today.    Pertinent History  Patient had a TIA 6/20 and was at [facility] for  3 days. He is having balance instabiity and is having difficulty with walking.Pateint has bursitis in right hip, he has back pain that is intermittent and begins when he stands up. He had vertigo that started up again last week. It is intermittent. and today it is gone.    Limitations  Walking    How long can you stand comfortably?  20 mins    How long can you walk comfortably?  20 mins    Patient Stated Goals  to have better balance    Currently in Pain?  Yes    Pain Score  3     Pain Location  Hip    Pain Orientation  Right    Pain Descriptors / Indicators  Aching    Pain Type  Chronic pain    Pain Onset  More than a month ago    Pain Frequency  Intermittent    Aggravating Factors   walking    Pain Relieving Factors  rest    Effect of Pain on Daily Activities  difficult to get the mail and walk intermediate distances       Treatment: Neuromuscular Training: Stool: staggered stance, head turns side/side, up/down x 5 reps each, each foot in front; VCs for proper technique and positioning for each exercise staggered stance, trunk rotation with 2 lb rod, VC to keep UE straight and turn head with trunk  1/2 foam horizontal standing , tandem standing x 2 for 2 mins   Hurdle: Forward and backward stepping over hurdle x15 each direction, VCs to take big enough steps and to try to increase speed to work on coordination  Side stepping over hurdle 15x each direction  Blue Foam: Side stepping x10 on blue balance CGA for safety, VCs for taking a big enough step  Airex pad trunk rotation x2 min, CGA for safety, demonstrated difficulty with keeping arms extended and full rotation with head turn  BOSU: Lunge to BOSU ball x 10 , cues for going slow and to control the speed  Manual therapy: RLE Long axis glide , grade 4 x 30 bouts x 4    Patients pain in right hip improves from 3/10 to 0/10 following treatment Pt educated throughout session about proper posture and technique with  exercises. Improved exercise technique, movement at target joints, use of target muscles after min to mod verbal, visual, tactile cues. CGA and Min to mod verbal cues used throughout with increased in postural sway and LOB most seen with narrow base of support and while on uneven surfaces. Continues to have balance deficits typical with diagnosis. Patient performs intermediate level exercises without pain behaviors and needs verbal cuing for postural alignment and head positioning                     PT Education - 04/03/19 1359    Education Details  HEP    Person(s)  Educated  Patient    Methods  Explanation;Demonstration;Tactile cues;Verbal cues       PT Short Term Goals - 02/18/19 1644      PT SHORT TERM GOAL #1   Title  Patient will be independent in home exercise program to improve strength/mobility for better functional independence with ADLs.    Time  4    Period  Weeks    Status  New    Target Date  03/18/19      PT SHORT TERM GOAL #2   Title  Patient will increase Berg Balance score by > 6 points to demonstrate decreased fall risk during functional activities.    Baseline  42/56    Time  4        PT Long Term Goals - 02/18/19 1729      PT LONG TERM GOAL #1   Title  Patient will increase ABC scale score >80% to demonstrate better functional mobility and better confidence with ADLs.    Time  8    Period  Weeks    Status  New    Target Date  04/15/19      PT LONG TERM GOAL #2   Title  Patient will increase LEFS score 8 points to demonstrate better functional mobility    Time  8    Period  Weeks    Status  New    Target Date  04/15/19      PT LONG TERM GOAL #3   Title  Patient will demonstrate an improved Berg Balance Score of  42/56 > as to demonstrate improved balance with ADLs such as sitting/standing and transfer balance and reduced fall risk.    Time  8    Period  Weeks    Status  New    Target Date  04/15/19            Plan -  04/03/19 1445    Clinical Impression Statement   Pt was able to perform all exercises today with CGA.Marland Kitchen Pt was able to perform all balance and strength exercises, demonstrating improvements in LE strength and stability.  Pt was able to complete dynamic balance exercises, showing ability to stand on even surfaces with min assist and improve postural reactions to correct self during activities.  Pt requires verbal, visual and tactile cues during exercise in order to complete tasks with proper form and technique.  Pt would continue to benefit from skilled PT services in order to further strengthen LE's, improve static and dynamic balance, and improve coordination in order to increase functional mobility and decrease risk of falls   Personal Factors and Comorbidities  Age;Comorbidity 1    Comorbidities  back pain, prostate cancer, knee replacement,    Examination-Activity Limitations  Carry;Dressing    Examination-Participation Restrictions  Yard Work;Church    Rehab Potential  Good    PT Frequency  2x / week    PT Duration  8 weeks    PT Treatment/Interventions  Canalith Repostioning;Neuromuscular re-education;Balance training;Therapeutic exercise;Patient/family education;Therapeutic activities;Passive range of motion    PT Next Visit Plan  balance, therapeutic exercise    PT Home Exercise Plan  heel raises, squats    Consulted and Agree with Plan of Care  Patient       Patient will benefit from skilled therapeutic intervention in order to improve the following deficits and impairments:  Impaired flexibility, Difficulty walking, Decreased safety awareness, Decreased balance, Abnormal gait, Decreased activity tolerance, Decreased endurance, Decreased range of motion, Pain  Visit Diagnosis: Muscle weakness (generalized)  Difficulty in walking, not elsewhere classified     Problem List Patient Active Problem List   Diagnosis Date Noted  . Diplopia 11/07/2018  . Paroxysmal atrial fibrillation  ([place]) 11/07/2018  . TIA (transient ischemic attack) 10/20/2018  . SAH (subarachnoid hemorrhage) ([place]) 10/16/2018  . Recurrent sinusitis 10/16/2018  . Low libido 08/30/2018  . Class 1 obesity without serious comorbidity with body mass index (BMI) of 31.0 to 31.9 in adult 05/14/2018  . Mixed hyperlipidemia 05/14/2018  . Degeneration of lumbar intervertebral disc 03/22/2018  . Osteoarthritis of hip 03/22/2018  . Trochanteric bursitis of right hip 03/22/2018  . Status post partial colectomy 01/03/2017  . Depression, major, single episode, mild ([place]) 01/03/2017  . Vasovagal syncope 01/27/2016  . History of skin cancer 10/28/2015  . Osteoarthritis of right knee 10/01/2015  . Essential hypertension 08/25/2015  . Dyspnea on exertion 08/12/2015  . Erectile dysfunction following radiation therapy 08/04/2015  . Constipation 08/02/2015  . History of prostate cancer 01/20/2015  . Swelling of right lower extremity 01/20/2015     Alanson Puls, [place] DPT 04/03/2019, 2:46 PM  [place] MAIN [facility] SERVICES [address] [place], [place], [address] Phone: [phone]   Fax:  [phone]  Name: Shaman Muscarella. MRN: [id number] Date of Birth: [birth year]

## 2019-04-08 ENCOUNTER — Other Ambulatory Visit: Payer: Self-pay

## 2019-04-08 ENCOUNTER — Ambulatory Visit: Payer: Medicare Other | Admitting: Physical Therapy

## 2019-04-08 ENCOUNTER — Encounter: Payer: Self-pay | Admitting: Physical Therapy

## 2019-04-08 DIAGNOSIS — R262 Difficulty in walking, not elsewhere classified: Secondary | ICD-10-CM

## 2019-04-08 DIAGNOSIS — M6281 Muscle weakness (generalized): Secondary | ICD-10-CM

## 2019-04-08 NOTE — Therapy (Signed)
[place] MAIN [facility] SERVICES [address] [place], [place], [address] Phone: [phone]   Fax:  [phone]  Physical Therapy Treatment Physical Therapy Progress Note   Dates of reporting period 02/18/19  to 04/08/19  Patient Details  Name: Cameron Foster. MRN: [id number] Date of Birth: [birth year] Referring Provider (PT): Lavon Paganini   Encounter Date: 04/08/2019  PT End of Session - 04/08/19 1443    Visit Number  10    Number of Visits  17    Date for PT Re-Evaluation  04/15/19    PT Start Time  1440    PT Stop Time  06/23/1516    PT Time Calculation (min)  38 min    Equipment Utilized During Treatment  Gait belt    Activity Tolerance  Patient tolerated treatment well;No increased pain    Behavior During Therapy  WFL for tasks assessed/performed       Past Medical History:  Diagnosis Date  . Alcoholism ([place])   . Arthritis   . Atrial fibrillation ([place])    one episode  . Colon polyp   . Depression    Son died June 23, 2014  . Diverticulitis   . Diverticulosis 30 years  . Dysrhythmia    At Fib 1995  . GERD (gastroesophageal reflux disease)   . Hyperlipidemia   . Hypertension   . Irritable bowel syndrome   . Prostate cancer ([place]) 06/23/2010   treated with radiation therapy. Prostate  . Vasovagal syncope     Past Surgical History:  Procedure Laterality Date  . [place] no stents  . CATARACT EXTRACTION, BILATERAL    . COLON RESECTION SIGMOID N/A 12/07/2016   Procedure: COLON RESECTION SIGMOID;  Surgeon: Clayburn Pert, MD;  Location: [facility] ORS;  Service: General;  Laterality: N/A;  . COLON SURGERY  11/2016   Colostomy  . COLONOSCOPY  2014-06-23  . COLONOSCOPY WITH PROPOFOL N/A 05/30/2016   Procedure: COLONOSCOPY WITH PROPOFOL;  Surgeon: Lollie Sails, MD;  Location: [facility] ENDOSCOPY;  Service: Endoscopy;  Laterality: N/A;  . COLOSTOMY Left 12/07/2016   Procedure: COLOSTOMY;  Surgeon: Clayburn Pert, MD;  Location: [facility] ORS;  Service: General;  Laterality: Left;  . COLOSTOMY REVERSAL N/A 03/21/2017   Procedure: COLOSTOMY REVERSAL;  Surgeon: Clayburn Pert, MD;  Location: [facility] ORS;  Service: General;  Laterality: N/A;  . COLOSTOMY TAKEDOWN N/A 03/21/2017   Procedure: LAPAROSCOPIC COLOSTOMY TAKEDOWN;  Surgeon: Clayburn Pert, MD;  Location: [facility] ORS;  Service: General;  Laterality: N/A;  . CYSTOSCOPY WITH STENT PLACEMENT Bilateral 03/21/2017   Procedure: CYSTOSCOPY WITH LIGHTED STENT PLACEMENT;  Surgeon: Abbie Sons, MD;  Location: [facility] ORS;  Service: Urology;  Laterality: Bilateral;  . ESOPHAGOGASTRODUODENOSCOPY    . ESOPHAGOGASTRODUODENOSCOPY N/A 05/30/2016   Procedure: ESOPHAGOGASTRODUODENOSCOPY (EGD);  Surgeon: Lollie Sails, MD;  Location: [facility] ENDOSCOPY;  Service: Endoscopy;  Laterality: N/A;  . EYE SURGERY     cataracts  . FLEXIBLE SIGMOIDOSCOPY N/A 03/21/2017   Procedure: FLEXIBLE SIGMOIDOSCOPY;  Surgeon: Clayburn Pert, MD;  Location: [facility] ORS;  Service: General;  Laterality: N/A;  . FRACTURE SURGERY Bilateral    right arm and left wrist  . INCISION AND DRAINAGE ABSCESS N/A 12/07/2016   Procedure: DRAINAGE  OF INTRA ABDOMINAL ABSCESS;  Surgeon: Clayburn Pert, MD;  Location: [facility] ORS;  Service: General;  Laterality: N/A;  . KNEE ARTHROSCOPY    . LAPAROTOMY N/A 12/07/2016   Procedure: EXPLORATORY LAPAROTOMY;  Surgeon: Clayburn Pert, MD;  Location: [facility] ORS;  Service: General;  Laterality: N/A;  . PROSTATE SURGERY     Microwave therapy  . TONSILLECTOMY    . TOTAL KNEE ARTHROPLASTY Right 07/27/2016   Procedure: TOTAL KNEE ARTHROPLASTY;  Surgeon: [name], MD;  Location: [facility] ORS;  Service: Orthopedics;  Laterality: Right;  Dr. Erlene Quan had to place Urinary catheter due to prostate cancer history.  Using flexible scope.    There were no vitals filed for this visit.  Subjective Assessment - 04/08/19 1442    Subjective  Patient reported that he doing ok  today.    Pertinent History  Patient had a TIA 6/20 and was at [facility] for 3 days. He is having balance instabiity and is having difficulty with walking.Pateint has bursitis in right hip, he has back pain that is intermittent and begins when he stands up. He had vertigo that started up again last week. It is intermittent. and today it is gone.    Limitations  Walking    How long can you stand comfortably?  20 mins    How long can you walk comfortably?  20 mins    Patient Stated Goals  to have better balance    Currently in Pain?  No/denies    Pain Score  0-No pain    Pain Onset  More than a month ago         [facility] PT Assessment - 04/08/19 0001      Standardized Balance Assessment   Standardized Balance Assessment  Berg Balance Test      Berg Balance Test   Sit to Stand  Able to stand without using hands and stabilize independently    Standing Unsupported  Able to stand safely 2 minutes    Sitting with Back Unsupported but Feet Supported on Floor or Stool  Able to sit safely and securely 2 minutes    Stand to Sit  Sits safely with minimal use of hands    Transfers  Able to transfer safely, minor use of hands    Standing Unsupported with Eyes Closed  Able to stand 10 seconds safely    Standing Unsupported with Feet Together  Able to place feet together independently and stand 1 minute safely    From Standing, Reach Forward with Outstretched Arm  Can reach forward >12 cm safely (5")    From Standing Position, Pick up Object from Floor  Able to pick up shoe safely and easily    From Standing Position, Turn to Look Behind Over each Shoulder  Looks behind from both sides and weight shifts well    Turn 360 Degrees  Able to turn 360 degrees safely in 4 seconds or less    Standing Unsupported, Alternately Place Feet on Step/Stool  Able to stand independently and safely and complete 8 steps in 20 seconds    Standing Unsupported, One Foot in Front  Able to take small step independently and hold 30  seconds    Standing on One Leg  Tries to lift leg/unable to hold 3 seconds but remains standing independently    Total Score  50      Treatment: Outcome measures were performed including Berg balance, ABC, and LEFS  Patient progressed in all goals                     PT Education - 04/08/19 1443    Education Details  HEP    Person(s) Educated  Patient    Methods  Explanation  Comprehension  Verbalized understanding;Returned demonstration;Verbal cues required;Tactile cues required;Need further instruction       PT Short Term Goals - 04/08/19 1445      PT SHORT TERM GOAL #1   Title  Patient will be independent in home exercise program to improve strength/mobility for better functional independence with ADLs.    Time  4    Period  Weeks    Status  Achieved    Target Date  03/18/19      PT SHORT TERM GOAL #2   Title  Patient will increase Berg Balance score by > 6 points to demonstrate decreased fall risk during functional activities.    Baseline  42/56, 04/08/19=50/56    Time  4        PT Long Term Goals - 04/08/19 1444      PT LONG TERM GOAL #1   Title  Patient will increase ABC scale score >80% to demonstrate better functional mobility and better confidence with ADLs.    Time  8    Period  Weeks    Status  Partially Met    Target Date  04/15/19      PT LONG TERM GOAL #2   Title  Patient will increase LEFS score 8 points to demonstrate better functional mobility    Time  8    Period  Weeks    Status  Partially Met    Target Date  04/15/19      PT LONG TERM GOAL #3   Title  Patient will demonstrate an improved Berg Balance Score of  42/56 > as to demonstrate improved balance with ADLs such as sitting/standing and transfer balance and reduced fall risk.    Time  8    Period  Weeks    Status  Partially Met    Target Date  04/15/19            Plan - 04/08/19 1459    Clinical Impression Statement  Patient's condition has the potential to  improve in response to therapy. Maximum improvement is yet to be obtained. The anticipated improvement is attainable and reasonable in a generally predictable time.  Patient reports that he is feeling more steady on his feet. He is making progress towards his goals. Patient will continue to benefit from skilled PT to improve mobility and safety.    Personal Factors and Comorbidities  Age;Comorbidity 1    Comorbidities  back pain, prostate cancer, knee replacement,    Examination-Activity Limitations  Carry;Dressing    Examination-Participation Restrictions  Yard Work;Church    Rehab Potential  Good    PT Frequency  2x / week    PT Duration  8 weeks    PT Treatment/Interventions  Canalith Repostioning;Neuromuscular re-education;Balance training;Therapeutic exercise;Patient/family education;Therapeutic activities;Passive range of motion    PT Next Visit Plan  balance, therapeutic exercise    PT Home Exercise Plan  heel raises, squats    Consulted and Agree with Plan of Care  Patient       Patient will benefit from skilled therapeutic intervention in order to improve the following deficits and impairments:  Impaired flexibility, Difficulty walking, Decreased safety awareness, Decreased balance, Abnormal gait, Decreased activity tolerance, Decreased endurance, Decreased range of motion, Pain  Visit Diagnosis: Muscle weakness (generalized)  Difficulty in walking, not elsewhere classified     Problem List Patient Active Problem List   Diagnosis Date Noted  . Diplopia 11/07/2018  . Paroxysmal atrial fibrillation ([place]) 11/07/2018  . TIA (transient ischemic  attack) 10/20/2018  . SAH (subarachnoid hemorrhage) ([place]) 10/16/2018  . Recurrent sinusitis 10/16/2018  . Low libido 08/30/2018  . Class 1 obesity without serious comorbidity with body mass index (BMI) of 31.0 to 31.9 in adult 05/14/2018  . Mixed hyperlipidemia 05/14/2018  . Degeneration of lumbar intervertebral disc 03/22/2018  .  Osteoarthritis of hip 03/22/2018  . Trochanteric bursitis of right hip 03/22/2018  . Status post partial colectomy 01/03/2017  . Depression, major, single episode, mild ([place]) 01/03/2017  . Vasovagal syncope 01/27/2016  . History of skin cancer 10/28/2015  . Osteoarthritis of right knee 10/01/2015  . Essential hypertension 08/25/2015  . Dyspnea on exertion 08/12/2015  . Erectile dysfunction following radiation therapy 08/04/2015  . Constipation 08/02/2015  . History of prostate cancer 01/20/2015  . Swelling of right lower extremity 01/20/2015    Alanson Puls, [place] DPT 04/08/2019, 3:00 PM  [place] MAIN [facility] SERVICES [address] [place], [place], [address] Phone: [phone]   Fax:  [phone]  Name: Emilliano Dilworth. MRN: [id number] Date of Birth: 1941/01/18

## 2019-04-10 ENCOUNTER — Ambulatory Visit: Payer: Medicare Other | Admitting: Physical Therapy

## 2019-04-15 ENCOUNTER — Ambulatory Visit: Payer: PRIVATE HEALTH INSURANCE | Admitting: Physical Therapy

## 2019-04-17 ENCOUNTER — Ambulatory Visit: Payer: Medicare Other | Admitting: Physical Therapy

## 2019-04-17 ENCOUNTER — Encounter: Payer: Self-pay | Admitting: Physical Therapy

## 2019-04-17 ENCOUNTER — Other Ambulatory Visit: Payer: Self-pay

## 2019-04-17 DIAGNOSIS — R262 Difficulty in walking, not elsewhere classified: Secondary | ICD-10-CM | POA: Diagnosis not present

## 2019-04-17 DIAGNOSIS — M6281 Muscle weakness (generalized): Secondary | ICD-10-CM

## 2019-04-17 NOTE — Therapy (Signed)
[place] MAIN [facility] SERVICES [address] [place], [place], [address] Phone: [phone]   Fax:  [phone]  Physical Therapy Treatment and Re-certification Period from 02/18/2019 - 04/17/2019  Patient Details  Name: Cameron Foster. MRN: [id number] Date of Birth: [birth year] Referring Provider (PT): Lavon Paganini   Encounter Date: 04/17/2019  PT End of Session - 04/17/19 1428    Visit Number  11    Number of Visits  17    Date for PT Re-Evaluation  04/15/19    PT Start Time  1430    PT Stop Time  1515    PT Time Calculation (min)  45 min    Equipment Utilized During Treatment  Gait belt    Activity Tolerance  Patient tolerated treatment well;No increased pain    Behavior During Therapy  WFL for tasks assessed/performed       Past Medical History:  Diagnosis Date  . Alcoholism ([place])   . Arthritis   . Atrial fibrillation ([place])    one episode  . Colon polyp   . Depression    Son died June 29, 2014  . Diverticulitis   . Diverticulosis 30 years  . Dysrhythmia    At Fib 1995  . GERD (gastroesophageal reflux disease)   . Hyperlipidemia   . Hypertension   . Irritable bowel syndrome   . Prostate cancer ([place]) 2010-06-29   treated with radiation therapy. Prostate  . Vasovagal syncope     Past Surgical History:  Procedure Laterality Date  . [place] no stents  . CATARACT EXTRACTION, BILATERAL    . COLON RESECTION SIGMOID N/A 12/07/2016   Procedure: COLON RESECTION SIGMOID;  Surgeon: Clayburn Pert, MD;  Location: [facility] ORS;  Service: General;  Laterality: N/A;  . COLON SURGERY  11/2016   Colostomy  . COLONOSCOPY  2014/06/29  . COLONOSCOPY WITH PROPOFOL N/A 05/30/2016   Procedure: COLONOSCOPY WITH PROPOFOL;  Surgeon: Lollie Sails, MD;  Location: [facility] ENDOSCOPY;  Service: Endoscopy;  Laterality: N/A;  . COLOSTOMY Left 12/07/2016   Procedure: COLOSTOMY;  Surgeon: Clayburn Pert, MD;  Location: [facility] ORS;   Service: General;  Laterality: Left;  . COLOSTOMY REVERSAL N/A 03/21/2017   Procedure: COLOSTOMY REVERSAL;  Surgeon: Clayburn Pert, MD;  Location: [facility] ORS;  Service: General;  Laterality: N/A;  . COLOSTOMY TAKEDOWN N/A 03/21/2017   Procedure: LAPAROSCOPIC COLOSTOMY TAKEDOWN;  Surgeon: Clayburn Pert, MD;  Location: [facility] ORS;  Service: General;  Laterality: N/A;  . CYSTOSCOPY WITH STENT PLACEMENT Bilateral 03/21/2017   Procedure: CYSTOSCOPY WITH LIGHTED STENT PLACEMENT;  Surgeon: Abbie Sons, MD;  Location: [facility] ORS;  Service: Urology;  Laterality: Bilateral;  . ESOPHAGOGASTRODUODENOSCOPY    . ESOPHAGOGASTRODUODENOSCOPY N/A 05/30/2016   Procedure: ESOPHAGOGASTRODUODENOSCOPY (EGD);  Surgeon: Lollie Sails, MD;  Location: [facility] ENDOSCOPY;  Service: Endoscopy;  Laterality: N/A;  . EYE SURGERY     cataracts  . FLEXIBLE SIGMOIDOSCOPY N/A 03/21/2017   Procedure: FLEXIBLE SIGMOIDOSCOPY;  Surgeon: Clayburn Pert, MD;  Location: [facility] ORS;  Service: General;  Laterality: N/A;  . FRACTURE SURGERY Bilateral    right arm and left wrist  . INCISION AND DRAINAGE ABSCESS N/A 12/07/2016   Procedure: DRAINAGE  OF INTRA ABDOMINAL ABSCESS;  Surgeon: Clayburn Pert, MD;  Location: [facility] ORS;  Service: General;  Laterality: N/A;  . KNEE ARTHROSCOPY    . LAPAROTOMY N/A 12/07/2016   Procedure: EXPLORATORY LAPAROTOMY;  Surgeon: Clayburn Pert, MD;  Location: [facility] ORS;  Service: General;  Laterality: N/A;  . PROSTATE SURGERY     Microwave therapy  . TONSILLECTOMY    . TOTAL KNEE ARTHROPLASTY Right 07/27/2016   Procedure: TOTAL KNEE ARTHROPLASTY;  Surgeon: [name], MD;  Location: [facility] ORS;  Service: Orthopedics;  Laterality: Right;  Dr. Erlene Quan had to place Urinary catheter due to prostate cancer history.  Using flexible scope.    There were no vitals filed for this visit.  Subjective Assessment - 04/17/19 1435    Subjective  Patient reported that he is doing well today, no falls or stumbles  since last PT session, no complaints of pain    Pertinent History  Patient had a TIA 6/20 and was at [facility] for 3 days. He is having balance instabiity and is having difficulty with walking.Pateint has bursitis in right hip, he has back pain that is intermittent and begins when he stands up. He had vertigo that started up again last week. It is intermittent. and today it is gone.    Limitations  Walking    How long can you stand comfortably?  20 mins    How long can you walk comfortably?  20 mins    Patient Stated Goals  to have better balance    Currently in Pain?  No/denies       Treatment: Octane fitness x5 minutes level 2  Neuromuscular Training: 4" step with blue foam: One foot up on foam with head turns vertical/horizontal, 2x10 ea side ea direction Step taps to blue foam 2x10, standing rest breaks between sets. One foot up on foam shoulder flexion (bilaterally ) with 2 lb rod, VC to keep UE straight and keep eyes on the bar, ea foot up with x10 arm lifts   Tandem stance on firm surface x3 x30sec ea side, last rep without UE to assist with repositioning of feet, minA for balance  Lateral stepping on blue foam balance beam x5 lengths, occasional Min to correct LOB, cues for activity pacing  tandem standing on foam balance beam 3x30sec ea side, cues for balance strategies and upright posture  Single leg stance with minA x10 x4 ea side  Pt response/clinical impression: The patient demonstrated excellent motivation during session. Pt pleased with his ability to perform narrow stance exercises this session compared to last. The patient needed CGA-minA throughout all balance exercises for safety as well as extensive multimodal cues for balance strategies. Pt due for re-certification this session, goals carried forward due to very recent reassessment. The patient would benefit from further skilled PT intervention to continue to progress towards goals to maximize safety and functional  abilities.       PT Education - 04/17/19 1427    Education Details  exercise form/technique    Person(s) Educated  Patient    Methods  Explanation    Comprehension  Verbalized understanding;Returned demonstration;Verbal cues required;Tactile cues required       PT Short Term Goals - 04/08/19 1445      PT SHORT TERM GOAL #1   Title  Patient will be independent in home exercise program to improve strength/mobility for better functional independence with ADLs.    Time  4    Period  Weeks    Status  Achieved    Target Date  03/18/19      PT SHORT TERM GOAL #2   Title  Patient will increase Berg Balance score by > 6 points to demonstrate decreased fall risk during functional activities.    Baseline  42/56, 04/08/19=50/56  Time  4        PT Long Term Goals - 04/08/19 1444      PT LONG TERM GOAL #1   Title  Patient will increase ABC scale score >80% to demonstrate better functional mobility and better confidence with ADLs.    Baseline  04/08/19=89%    Time  8    Period  Weeks    Status  Partially Met    Target Date  04/15/19      PT LONG TERM GOAL #2   Title  Patient will increase LEFS score 8 points to demonstrate better functional mobility    Baseline  54/80    Time  8    Period  Weeks    Status  Partially Met    Target Date  04/15/19      PT LONG TERM GOAL #3   Title  Patient will demonstrate an improved Berg Balance Score of  42/56 > as to demonstrate improved balance with ADLs such as sitting/standing and transfer balance and reduced fall risk.    Time  8    Period  Weeks    Status  Partially Met    Target Date  04/15/19            Plan - 04/17/19 1638    Clinical Impression Statement  The patient demonstrated excellent motivation during session. Pt pleased with his ability to perform narrow stance exercises this session compared to last. The patient needed CGA-minA throughout all balance exercises for safety as well as extensive multimodal cues for  balance strategies. Pt due for re-certification this session, goals carried forward due to very recent reassessment. The patient would benefit from further skilled PT intervention to continue to progress towards goals to maximize safety and functional abilities.    Personal Factors and Comorbidities  Age;Comorbidity 1    Comorbidities  back pain, prostate cancer, knee replacement,    Examination-Activity Limitations  Carry;Dressing    Examination-Participation Restrictions  Yard Work;Church    Rehab Potential  Good    PT Frequency  2x / week    PT Duration  6 weeks    PT Treatment/Interventions  [place] Repostioning;Neuromuscular re-education;Balance training;Therapeutic exercise;Patient/family education;Therapeutic activities;Passive range of motion    PT Next Visit Plan  balance, therapeutic exercise    PT Home Exercise Plan  heel raises, squats    Consulted and Agree with Plan of Care  Patient       Patient will benefit from skilled therapeutic intervention in order to improve the following deficits and impairments:  Impaired flexibility, Difficulty walking, Decreased safety awareness, Decreased balance, Abnormal gait, Decreased activity tolerance, Decreased endurance, Decreased range of motion, Pain  Visit Diagnosis: Muscle weakness (generalized)  Difficulty in walking, not elsewhere classified     Problem List Patient Active Problem List   Diagnosis Date Noted  . Diplopia 11/07/2018  . Paroxysmal atrial fibrillation ([place]) 11/07/2018  . TIA (transient ischemic attack) 10/20/2018  . SAH (subarachnoid hemorrhage) ([place]) 10/16/2018  . Recurrent sinusitis 10/16/2018  . Low libido 08/30/2018  . Class 1 obesity without serious comorbidity with body mass index (BMI) of 31.0 to 31.9 in adult 05/14/2018  . Mixed hyperlipidemia 05/14/2018  . Degeneration of lumbar intervertebral disc 03/22/2018  . Osteoarthritis of hip 03/22/2018  . Trochanteric bursitis of right hip 03/22/2018  .  Status post partial colectomy 01/03/2017  . Depression, major, single episode, mild ([place]) 01/03/2017  . Vasovagal syncope 01/27/2016  . History of skin cancer 10/28/2015  .  Osteoarthritis of right knee 10/01/2015  . Essential hypertension 08/25/2015  . Dyspnea on exertion 08/12/2015  . Erectile dysfunction following radiation therapy 08/04/2015  . Constipation 08/02/2015  . History of prostate cancer 01/20/2015  . Swelling of right lower extremity 01/20/2015    Lieutenant Diego PT, DPT 4:50 PM,04/17/19   [place] MAIN [facility] SERVICES [address] [place], [place], [address] Phone: [phone]   Fax:  [phone]  Name: Cameron Foster. MRN: [id number] Date of Birth: 1941-01-06

## 2019-04-21 ENCOUNTER — Other Ambulatory Visit: Payer: Self-pay | Admitting: Family Medicine

## 2019-04-22 ENCOUNTER — Other Ambulatory Visit: Payer: Self-pay | Admitting: *Deleted

## 2019-04-22 MED ORDER — FUROSEMIDE 20 MG PO TABS: 20.0000 mg | ORAL_TABLET | Freq: Every day | ORAL | 0 refills | Status: DC

## 2019-04-23 ENCOUNTER — Other Ambulatory Visit: Payer: Self-pay | Admitting: Pulmonary Disease

## 2019-04-24 ENCOUNTER — Ambulatory Visit: Payer: Medicare Other | Attending: Neurology

## 2019-04-24 ENCOUNTER — Other Ambulatory Visit: Payer: Self-pay

## 2019-04-24 DIAGNOSIS — R262 Difficulty in walking, not elsewhere classified: Secondary | ICD-10-CM

## 2019-04-24 DIAGNOSIS — M6281 Muscle weakness (generalized): Secondary | ICD-10-CM | POA: Diagnosis not present

## 2019-04-24 NOTE — Therapy (Addendum)
[place] MAIN [facility] SERVICES [address] [place], [place], [address] Phone: [phone]   Fax:  [phone]  Physical Therapy Treatment  Patient Details  Name: Cameron Foster. MRN: [id number] Date of Birth: [birth year] Referring Provider (PT): Lavon Paganini   Encounter Date: 04/24/2019    Past Medical History:  Diagnosis Date  . Alcoholism ([place])   . Arthritis   . Atrial fibrillation ([place])    one episode  . Colon polyp   . Depression    Son died Jun 22, 2014  . Diverticulitis   . Diverticulosis 30 years  . Dysrhythmia    At Fib 1995  . GERD (gastroesophageal reflux disease)   . Hyperlipidemia   . Hypertension   . Irritable bowel syndrome   . Prostate cancer ([place]) 06/22/2010   treated with radiation therapy. Prostate  . Vasovagal syncope     Past Surgical History:  Procedure Laterality Date  . [place] no stents  . CATARACT EXTRACTION, BILATERAL    . COLON RESECTION SIGMOID N/A 12/07/2016   Procedure: COLON RESECTION SIGMOID;  Surgeon: Clayburn Pert, MD;  Location: [facility] ORS;  Service: General;  Laterality: N/A;  . COLON SURGERY  11/2016   Colostomy  . COLONOSCOPY  22-Jun-2014  . COLONOSCOPY WITH PROPOFOL N/A 05/30/2016   Procedure: COLONOSCOPY WITH PROPOFOL;  Surgeon: Lollie Sails, MD;  Location: [facility] ENDOSCOPY;  Service: Endoscopy;  Laterality: N/A;  . COLOSTOMY Left 12/07/2016   Procedure: COLOSTOMY;  Surgeon: Clayburn Pert, MD;  Location: [facility] ORS;  Service: General;  Laterality: Left;  . COLOSTOMY REVERSAL N/A 03/21/2017   Procedure: COLOSTOMY REVERSAL;  Surgeon: Clayburn Pert, MD;  Location: [facility] ORS;  Service: General;  Laterality: N/A;  . COLOSTOMY TAKEDOWN N/A 03/21/2017   Procedure: LAPAROSCOPIC COLOSTOMY TAKEDOWN;  Surgeon: Clayburn Pert, MD;  Location: [facility] ORS;  Service: General;  Laterality: N/A;  . CYSTOSCOPY WITH STENT PLACEMENT Bilateral 03/21/2017   Procedure: CYSTOSCOPY  WITH LIGHTED STENT PLACEMENT;  Surgeon: Abbie Sons, MD;  Location: [facility] ORS;  Service: Urology;  Laterality: Bilateral;  . ESOPHAGOGASTRODUODENOSCOPY    . ESOPHAGOGASTRODUODENOSCOPY N/A 05/30/2016   Procedure: ESOPHAGOGASTRODUODENOSCOPY (EGD);  Surgeon: Lollie Sails, MD;  Location: [facility] ENDOSCOPY;  Service: Endoscopy;  Laterality: N/A;  . EYE SURGERY     cataracts  . FLEXIBLE SIGMOIDOSCOPY N/A 03/21/2017   Procedure: FLEXIBLE SIGMOIDOSCOPY;  Surgeon: Clayburn Pert, MD;  Location: [facility] ORS;  Service: General;  Laterality: N/A;  . FRACTURE SURGERY Bilateral    right arm and left wrist  . INCISION AND DRAINAGE ABSCESS N/A 12/07/2016   Procedure: DRAINAGE  OF INTRA ABDOMINAL ABSCESS;  Surgeon: Clayburn Pert, MD;  Location: [facility] ORS;  Service: General;  Laterality: N/A;  . KNEE ARTHROSCOPY    . LAPAROTOMY N/A 12/07/2016   Procedure: EXPLORATORY LAPAROTOMY;  Surgeon: Clayburn Pert, MD;  Location: [facility] ORS;  Service: General;  Laterality: N/A;  . PROSTATE SURGERY     Microwave therapy  . TONSILLECTOMY    . TOTAL KNEE ARTHROPLASTY Right 07/27/2016   Procedure: TOTAL KNEE ARTHROPLASTY;  Surgeon: [name], MD;  Location: [facility] ORS;  Service: Orthopedics;  Laterality: Right;  Dr. Erlene Quan had to place Urinary catheter due to prostate cancer history.  Using flexible scope.    There were no vitals filed for this visit.       Treatment: Octane fitness x4 minutes level 3   Neuromuscular Training: 6" step with blue foam: One foot on airex pad  on foot on 6" step 30 seconds x 2 trials each LE   airex pad balance beam: tandem walking with occasional SUE support, very slow stepping with CGA/min A for maintaining COM  4x length of // bars.   Single LE stance with opp LE on soccer ball with increasing time second duration 15-30 seconds each LE  Step over and back orange hurdles 12x each LE, cueing for foot clearance.   step over and stomp line with clap for widened step length  and step back, 12x each LE, challenging to step back behind opposite LE  Bosu ball, modified forward lunch with focus on pelvic forward press rather than trunk flexion, finger tip support 10x each LE  Bosu ball, modified lateral lunges with focus on weight shift with use of hips rather than forward trunk lean.  Standing heel toe raises 15x  seated hamstring stretch 60 second holds on 6" step        Pt educated throughout session about proper posture and technique with exercises. Improved exercise technique, movement at target joints, use of target muscles after min to mod verbal, visual, tactile cues.          Patient presents to physical therapy with excellent motivation. He is challenged with single limb stability and tandem stance requiring contact guard and Min A. Patient is challenged but able to perform modified single limb stability with cueing for upright posture. Patient will continue to benefit from skilled PT to improve mobility and safety     PT Short Term Goals - 04/08/19 1445      PT SHORT TERM GOAL #1   Title  Patient will be independent in home exercise program to improve strength/mobility for better functional independence with ADLs.    Time  4    Period  Weeks    Status  Achieved    Target Date  03/18/19      PT SHORT TERM GOAL #2   Title  Patient will increase Berg Balance score by > 6 points to demonstrate decreased fall risk during functional activities.    Baseline  42/56, 04/08/19=50/56    Time  4        PT Long Term Goals - 04/08/19 1444      PT LONG TERM GOAL #1   Title  Patient will increase ABC scale score >80% to demonstrate better functional mobility and better confidence with ADLs.    Baseline  04/08/19=89%    Time  8    Period  Weeks    Status  Partially Met    Target Date  04/15/19      PT LONG TERM GOAL #2   Title  Patient will increase LEFS score 8 points to demonstrate better functional mobility    Baseline  54/80    Time  8     Period  Weeks    Status  Partially Met    Target Date  04/15/19      PT LONG TERM GOAL #3   Title  Patient will demonstrate an improved Berg Balance Score of  42/56 > as to demonstrate improved balance with ADLs such as sitting/standing and transfer balance and reduced fall risk.    Time  8    Period  Weeks    Status  Partially Met    Target Date  04/15/19                 Patient will benefit from skilled therapeutic intervention in order to  improve the following deficits and impairments:  Impaired flexibility, Difficulty walking, Decreased safety awareness, Decreased balance, Abnormal gait, Decreased activity tolerance, Decreased endurance, Decreased range of motion, Pain  Visit Diagnosis: Muscle weakness (generalized)  Difficulty in walking, not elsewhere classified     Problem List Patient Active Problem List   Diagnosis Date Noted  . Diplopia 11/07/2018  . Paroxysmal atrial fibrillation ([place]) 11/07/2018  . TIA (transient ischemic attack) 10/20/2018  . SAH (subarachnoid hemorrhage) ([place]) 10/16/2018  . Recurrent sinusitis 10/16/2018  . Low libido 08/30/2018  . Class 1 obesity without serious comorbidity with body mass index (BMI) of 31.0 to 31.9 in adult 05/14/2018  . Mixed hyperlipidemia 05/14/2018  . Degeneration of lumbar intervertebral disc 03/22/2018  . Osteoarthritis of hip 03/22/2018  . Trochanteric bursitis of right hip 03/22/2018  . Status post partial colectomy 01/03/2017  . Depression, major, single episode, mild ([place]) 01/03/2017  . Vasovagal syncope 01/27/2016  . History of skin cancer 10/28/2015  . Osteoarthritis of right knee 10/01/2015  . Essential hypertension 08/25/2015  . Dyspnea on exertion 08/12/2015  . Erectile dysfunction following radiation therapy 08/04/2015  . Constipation 08/02/2015  . History of prostate cancer 01/20/2015  . Swelling of right lower extremity 01/20/2015   Janna Arch, PT, DPT   04/26/2019, 10:36 AM  [place] MAIN [facility] SERVICES [address] [place], [place], [address] Phone: [phone]   Fax:  [phone]  Name: Cameron Foster. MRN: [id number] Date of Birth: 1941/03/12

## 2019-04-25 ENCOUNTER — Other Ambulatory Visit: Payer: Self-pay | Admitting: Family Medicine

## 2019-04-25 ENCOUNTER — Ambulatory Visit: Payer: PRIVATE HEALTH INSURANCE

## 2019-04-25 DIAGNOSIS — I10 Essential (primary) hypertension: Secondary | ICD-10-CM

## 2019-04-27 DIAGNOSIS — G4733 Obstructive sleep apnea (adult) (pediatric): Secondary | ICD-10-CM | POA: Diagnosis not present

## 2019-04-29 ENCOUNTER — Other Ambulatory Visit: Payer: Self-pay

## 2019-04-29 ENCOUNTER — Ambulatory Visit: Payer: Medicare Other

## 2019-04-29 DIAGNOSIS — R262 Difficulty in walking, not elsewhere classified: Secondary | ICD-10-CM

## 2019-04-29 DIAGNOSIS — M6281 Muscle weakness (generalized): Secondary | ICD-10-CM

## 2019-04-29 NOTE — Addendum Note (Signed)
Addended by: Alanson Puls on: 04/29/2019 09:54 AM   Modules accepted: Orders

## 2019-04-29 NOTE — Therapy (Signed)
[place] MAIN [facility] SERVICES [address] [place], [place], [address] Phone: [phone]   Fax:  [phone]  Physical Therapy Treatment  Patient Details  Name: Cameron Foster. MRN: [id number] Date of Birth: [birth year] Referring Provider (PT): Lavon Paganini   Encounter Date: 04/29/2019  PT End of Session - 04/29/19 1351    Visit Number  13    Number of Visits  17    Date for PT Re-Evaluation  05/29/19    PT Start Time  0174    PT Stop Time  1429    PT Time Calculation (min)  44 min    Equipment Utilized During Treatment  Gait belt    Activity Tolerance  Patient tolerated treatment well;No increased pain    Behavior During Therapy  WFL for tasks assessed/performed       Past Medical History:  Diagnosis Date  . Alcoholism ([place])   . Arthritis   . Atrial fibrillation ([place])    one episode  . Colon polyp   . Depression    Son died June 26, 2014  . Diverticulitis   . Diverticulosis 30 years  . Dysrhythmia    At Fib 1995  . GERD (gastroesophageal reflux disease)   . Hyperlipidemia   . Hypertension   . Irritable bowel syndrome   . Prostate cancer ([place]) June 26, 2010   treated with radiation therapy. Prostate  . Vasovagal syncope     Past Surgical History:  Procedure Laterality Date  . [place] no stents  . CATARACT EXTRACTION, BILATERAL    . COLON RESECTION SIGMOID N/A 12/07/2016   Procedure: COLON RESECTION SIGMOID;  Surgeon: Clayburn Pert, MD;  Location: [facility] ORS;  Service: General;  Laterality: N/A;  . COLON SURGERY  11/2016   Colostomy  . COLONOSCOPY  June 26, 2014  . COLONOSCOPY WITH PROPOFOL N/A 05/30/2016   Procedure: COLONOSCOPY WITH PROPOFOL;  Surgeon: Lollie Sails, MD;  Location: [facility] ENDOSCOPY;  Service: Endoscopy;  Laterality: N/A;  . COLOSTOMY Left 12/07/2016   Procedure: COLOSTOMY;  Surgeon: Clayburn Pert, MD;  Location: [facility] ORS;  Service: General;  Laterality: Left;  . COLOSTOMY  REVERSAL N/A 03/21/2017   Procedure: COLOSTOMY REVERSAL;  Surgeon: Clayburn Pert, MD;  Location: [facility] ORS;  Service: General;  Laterality: N/A;  . COLOSTOMY TAKEDOWN N/A 03/21/2017   Procedure: LAPAROSCOPIC COLOSTOMY TAKEDOWN;  Surgeon: Clayburn Pert, MD;  Location: [facility] ORS;  Service: General;  Laterality: N/A;  . CYSTOSCOPY WITH STENT PLACEMENT Bilateral 03/21/2017   Procedure: CYSTOSCOPY WITH LIGHTED STENT PLACEMENT;  Surgeon: Abbie Sons, MD;  Location: [facility] ORS;  Service: Urology;  Laterality: Bilateral;  . ESOPHAGOGASTRODUODENOSCOPY    . ESOPHAGOGASTRODUODENOSCOPY N/A 05/30/2016   Procedure: ESOPHAGOGASTRODUODENOSCOPY (EGD);  Surgeon: Lollie Sails, MD;  Location: [facility] ENDOSCOPY;  Service: Endoscopy;  Laterality: N/A;  . EYE SURGERY     cataracts  . FLEXIBLE SIGMOIDOSCOPY N/A 03/21/2017   Procedure: FLEXIBLE SIGMOIDOSCOPY;  Surgeon: Clayburn Pert, MD;  Location: [facility] ORS;  Service: General;  Laterality: N/A;  . FRACTURE SURGERY Bilateral    right arm and left wrist  . INCISION AND DRAINAGE ABSCESS N/A 12/07/2016   Procedure: DRAINAGE  OF INTRA ABDOMINAL ABSCESS;  Surgeon: Clayburn Pert, MD;  Location: [facility] ORS;  Service: General;  Laterality: N/A;  . KNEE ARTHROSCOPY    . LAPAROTOMY N/A 12/07/2016   Procedure: EXPLORATORY LAPAROTOMY;  Surgeon: Clayburn Pert, MD;  Location: [facility] ORS;  Service: General;  Laterality: N/A;  . PROSTATE SURGERY  Microwave therapy  . TONSILLECTOMY    . TOTAL KNEE ARTHROPLASTY Right 07/27/2016   Procedure: TOTAL KNEE ARTHROPLASTY;  Surgeon: [name], MD;  Location: [facility] ORS;  Service: Orthopedics;  Laterality: Right;  Dr. Erlene Quan had to place Urinary catheter due to prostate cancer history.  Using flexible scope.    There were no vitals filed for this visit.  Subjective Assessment - 04/29/19 1348    Subjective  Patient reports no falls or LOB since last session. Had a quiet weekend.    Pertinent History  Patient had a TIA 6/20 and  was at [facility] for 3 days. He is having balance instabiity and is having difficulty with walking.Pateint has bursitis in right hip, he has back pain that is intermittent and begins when he stands up. He had vertigo that started up again last week. It is intermittent. and today it is gone.    Limitations  Walking    How long can you stand comfortably?  20 mins    How long can you walk comfortably?  20 mins    Patient Stated Goals  to have better balance    Currently in Pain?  Yes    Pain Score  4     Pain Location  Back    Pain Orientation  Lower    Pain Descriptors / Indicators  Aching    Pain Type  Chronic pain    Pain Onset  More than a month ago    Pain Frequency  Intermittent              Treatment: [employer] seat position 10, Lvl 3 4 minutes for cardiovascular support    By treadmill bar for support; all standing interventions require CGA and cueing for stabilization and support :   Large step clap each LE, 12x each LE, alternating LE, more challenging returning to bring leg back to center.   single leg step over and back orange hurdle, alternating pattern, no UE support, unsteadiness with posterior movement, occasional circumduction of RLE  Tensioned sit to stand squats modified with use of fingertip support on treadmill bar to keep upright posture 12x; chair behind patient    airex pad: -marching with BUE support, focus on slow controlled movements for stability in single limb challenge 15x each LE   Weave between 6 cones x4 trials with no task, one standing rest break with dual tasking of name; progressed to dual tasking of naming grocery list with weaving; 4x with one rest break seated: increased shuffle step with repetition   Forward/backward weave between cones ; requires cueing for task orientation. Requires demonstration for proper steppage. Backwards ambulation very challenging for patient x 2 trials, dual task of naming alphabet.   Sticky note star drill:  forward, lateral and backwards tapping. 15x each LE ; cueing for SPT for task orientation/sequencing to keep stability , tactile cueing for upright posture  3lb ankle weight: BUE support and CGA standing -standing marches 12x each LE; focus on slow and steady with upright posture ; cueing for widening BOS for stability  -standing hip abduction 15x each LE; BUE support  -standing hip extension 15x each LE ; cueing for keeping knee straight  Seated with 3lb ankle weight: -seated LAQ 15x each LE ; cueing for upright posture and core activation.    Pt educated throughout session about proper posture and technique with exercises. Improved exercise technique, movement at target joints, use of target muscles after min to mod verbal, visual, tactile cues.  PT Education - 04/29/19 1349    Education Details  exercise technique, body mechanics    Person(s) Educated  Patient    Methods  Explanation;Demonstration;Tactile cues;Verbal cues    Comprehension  Verbalized understanding;Returned demonstration;Verbal cues required;Tactile cues required       PT Short Term Goals - 04/08/19 1445      PT SHORT TERM GOAL #1   Title  Patient will be independent in home exercise program to improve strength/mobility for better functional independence with ADLs.    Time  4    Period  Weeks    Status  Achieved    Target Date  03/18/19      PT SHORT TERM GOAL #2   Title  Patient will increase Berg Balance score by > 6 points to demonstrate decreased fall risk during functional activities.    Baseline  42/56, 04/08/19=50/56    Time  4        PT Long Term Goals - 04/08/19 1444      PT LONG TERM GOAL #1   Title  Patient will increase ABC scale score >80% to demonstrate better functional mobility and better confidence with ADLs.    Baseline  04/08/19=89%    Time  8    Period  Weeks    Status  Partially Met    Target Date  04/15/19      PT LONG TERM GOAL #2   Title  Patient will  increase LEFS score 8 points to demonstrate better functional mobility    Baseline  54/80    Time  8    Period  Weeks    Status  Partially Met    Target Date  04/15/19      PT LONG TERM GOAL #3   Title  Patient will demonstrate an improved Berg Balance Score of  42/56 > as to demonstrate improved balance with ADLs such as sitting/standing and transfer balance and reduced fall risk.    Time  8    Period  Weeks    Status  Partially Met    Target Date  04/15/19            Plan - 04/29/19 1413    Clinical Impression Statement  Patient presents with good motivation to physical therapy session. He is challenged with stability in regards to single limb and unstable surfaces with posterior COM resulting in instability and use of UE support. Dual task mobility results in increased episodes of shuffle step. Patient will continue to benefit from skilled PT to improve mobility and safety    Personal Factors and Comorbidities  Age;Comorbidity 1    Comorbidities  back pain, prostate cancer, knee replacement,    Examination-Activity Limitations  Carry;Dressing    Examination-Participation Restrictions  Yard Work;Church    Rehab Potential  Good    PT Frequency  2x / week    PT Duration  6 weeks    PT Treatment/Interventions  Canalith Repostioning;Neuromuscular re-education;Balance training;Therapeutic exercise;Patient/family education;Therapeutic activities;Passive range of motion    PT Next Visit Plan  balance, therapeutic exercise    PT Home Exercise Plan  heel raises, squats    Consulted and Agree with Plan of Care  Patient       Patient will benefit from skilled therapeutic intervention in order to improve the following deficits and impairments:  Impaired flexibility, Difficulty walking, Decreased safety awareness, Decreased balance, Abnormal gait, Decreased activity tolerance, Decreased endurance, Decreased range of motion, Pain  Visit Diagnosis: Muscle weakness  (generalized)  Difficulty in walking, not elsewhere classified     Problem List Patient Active Problem List   Diagnosis Date Noted  . Diplopia 11/07/2018  . Paroxysmal atrial fibrillation ([place]) 11/07/2018  . TIA (transient ischemic attack) 10/20/2018  . SAH (subarachnoid hemorrhage) ([place]) 10/16/2018  . Recurrent sinusitis 10/16/2018  . Low libido 08/30/2018  . Class 1 obesity without serious comorbidity with body mass index (BMI) of 31.0 to 31.9 in adult 05/14/2018  . Mixed hyperlipidemia 05/14/2018  . Degeneration of lumbar intervertebral disc 03/22/2018  . Osteoarthritis of hip 03/22/2018  . Trochanteric bursitis of right hip 03/22/2018  . Status post partial colectomy 01/03/2017  . Depression, major, single episode, mild ([place]) 01/03/2017  . Vasovagal syncope 01/27/2016  . History of skin cancer 10/28/2015  . Osteoarthritis of right knee 10/01/2015  . Essential hypertension 08/25/2015  . Dyspnea on exertion 08/12/2015  . Erectile dysfunction following radiation therapy 08/04/2015  . Constipation 08/02/2015  . History of prostate cancer 01/20/2015  . Swelling of right lower extremity 01/20/2015   Janna Arch, PT, DPT   04/29/2019, 2:30 PM  [place] MAIN [facility] SERVICES [address] [place], [place], [address] Phone: [phone]   Fax:  [phone]  Name: Marckus Hanover. MRN: [id number] Date of Birth: 12-01-1940

## 2019-05-02 ENCOUNTER — Other Ambulatory Visit: Payer: Self-pay

## 2019-05-02 ENCOUNTER — Ambulatory Visit: Payer: Medicare Other

## 2019-05-02 DIAGNOSIS — M6281 Muscle weakness (generalized): Secondary | ICD-10-CM | POA: Diagnosis not present

## 2019-05-02 DIAGNOSIS — R262 Difficulty in walking, not elsewhere classified: Secondary | ICD-10-CM | POA: Diagnosis not present

## 2019-05-02 NOTE — Therapy (Signed)
[place] MAIN [facility] SERVICES [address] [place], [place], [address] Phone: [phone]   Fax:  [phone]  Physical Therapy Treatment  Patient Details  Name: Cameron Foster. MRN: [id number] Date of Birth: 1940/12/17 Referring Provider (PT): Lavon Paganini   Encounter Date: 05/02/2019  PT End of Session - 05/02/19 1535    Visit Number  14    Number of Visits  17    Date for PT Re-Evaluation  05/29/19    PT Start Time  6962    PT Stop Time  1544    PT Time Calculation (min)  29 min    Equipment Utilized During Treatment  Gait belt    Activity Tolerance  Patient tolerated treatment well;No increased pain    Behavior During Therapy  WFL for tasks assessed/performed       Past Medical History:  Diagnosis Date  . Alcoholism ([place])   . Arthritis   . Atrial fibrillation ([place])    one episode  . Colon polyp   . Depression    Son died 2014-06-21  . Diverticulitis   . Diverticulosis 30 years  . Dysrhythmia    At Fib 1995  . GERD (gastroesophageal reflux disease)   . Hyperlipidemia   . Hypertension   . Irritable bowel syndrome   . Prostate cancer ([place]) 06/21/2010   treated with radiation therapy. Prostate  . Vasovagal syncope     Past Surgical History:  Procedure Laterality Date  . [place] no stents  . CATARACT EXTRACTION, BILATERAL    . COLON RESECTION SIGMOID N/A 12/07/2016   Procedure: COLON RESECTION SIGMOID;  Surgeon: Clayburn Pert, MD;  Location: [facility] ORS;  Service: General;  Laterality: N/A;  . COLON SURGERY  11/2016   Colostomy  . COLONOSCOPY  06/21/14  . COLONOSCOPY WITH PROPOFOL N/A 05/30/2016   Procedure: COLONOSCOPY WITH PROPOFOL;  Surgeon: Lollie Sails, MD;  Location: [facility] ENDOSCOPY;  Service: Endoscopy;  Laterality: N/A;  . COLOSTOMY Left 12/07/2016   Procedure: COLOSTOMY;  Surgeon: Clayburn Pert, MD;  Location: [facility] ORS;  Service: General;  Laterality: Left;  . COLOSTOMY  REVERSAL N/A 03/21/2017   Procedure: COLOSTOMY REVERSAL;  Surgeon: Clayburn Pert, MD;  Location: [facility] ORS;  Service: General;  Laterality: N/A;  . COLOSTOMY TAKEDOWN N/A 03/21/2017   Procedure: LAPAROSCOPIC COLOSTOMY TAKEDOWN;  Surgeon: Clayburn Pert, MD;  Location: [facility] ORS;  Service: General;  Laterality: N/A;  . CYSTOSCOPY WITH STENT PLACEMENT Bilateral 03/21/2017   Procedure: CYSTOSCOPY WITH LIGHTED STENT PLACEMENT;  Surgeon: Abbie Sons, MD;  Location: [facility] ORS;  Service: Urology;  Laterality: Bilateral;  . ESOPHAGOGASTRODUODENOSCOPY    . ESOPHAGOGASTRODUODENOSCOPY N/A 05/30/2016   Procedure: ESOPHAGOGASTRODUODENOSCOPY (EGD);  Surgeon: Lollie Sails, MD;  Location: [facility] ENDOSCOPY;  Service: Endoscopy;  Laterality: N/A;  . EYE SURGERY     cataracts  . FLEXIBLE SIGMOIDOSCOPY N/A 03/21/2017   Procedure: FLEXIBLE SIGMOIDOSCOPY;  Surgeon: Clayburn Pert, MD;  Location: [facility] ORS;  Service: General;  Laterality: N/A;  . FRACTURE SURGERY Bilateral    right arm and left wrist  . INCISION AND DRAINAGE ABSCESS N/A 12/07/2016   Procedure: DRAINAGE  OF INTRA ABDOMINAL ABSCESS;  Surgeon: Clayburn Pert, MD;  Location: [facility] ORS;  Service: General;  Laterality: N/A;  . KNEE ARTHROSCOPY    . LAPAROTOMY N/A 12/07/2016   Procedure: EXPLORATORY LAPAROTOMY;  Surgeon: Clayburn Pert, MD;  Location: [facility] ORS;  Service: General;  Laterality: N/A;  . PROSTATE SURGERY  Microwave therapy  . TONSILLECTOMY    . TOTAL KNEE ARTHROPLASTY Right 07/27/2016   Procedure: TOTAL KNEE ARTHROPLASTY;  Surgeon: [name], MD;  Location: [facility] ORS;  Service: Orthopedics;  Laterality: Right;  Dr. Erlene Quan had to place Urinary catheter due to prostate cancer history.  Using flexible scope.    There were no vitals filed for this visit.  Subjective Assessment - 05/02/19 1532    Subjective  Patient requests today be his last visit due to conflict with other appointments, wants a home program to do, hopeful  situation will return to normal at some point but for now cannot handle doing PT and doctor appointments.    Pertinent History  Patient had a TIA 6/20 and was at [facility] for 3 days. He is having balance instabiity and is having difficulty with walking.Pateint has bursitis in right hip, he has back pain that is intermittent and begins when he stands up. He had vertigo that started up again last week. It is intermittent. and today it is gone.    Limitations  Walking    How long can you stand comfortably?  20 mins    How long can you walk comfortably?  20 mins    Patient Stated Goals  to have better balance    Currently in Pain?  Yes    Pain Score  3     Pain Location  Back    Pain Orientation  Lower    Pain Descriptors / Indicators  Aching    Pain Type  Chronic pain    Pain Onset  More than a month ago    Pain Frequency  Intermittent         Patient wants today to be last session due to conflicts with other appointments.    Access Code: [email or web address]  URL: [email or web address]  Date: 05/02/2019  Prepared by: Janna Arch   Exercises Standing March with Unilateral Counter Support - 10 reps - 2 sets  5 hold - 1x daily - 7x weekly Standing Hip Abduction with Counter Support - 10 reps - 2 sets - 5 hold - 1x daily - 7x weekly Standing Hip Extension with Counter Support - 10 reps - 2 sets - 5 hold - 1x daily - 7x weekly Standing Tandem Balance with Counter Support - 10 reps - 2 sets - 5 hold - 1x daily - 7x weekly Heel rises with counter support - 10 reps - 2 sets - 5 hold - 1x daily - 7x weekly Heel Toe Raises with Counter Support - 10 reps - 2 sets - 5 hold - 1x daily - 7x weekly Sit to Stand - 10 reps - 2 sets - 5 hold - 1x daily - 7x weekly Seated Long Arc Quad - 10 reps - 2 sets - 5 hold - 1x daily - 7x weekly Seated Hip Abduction - 10 reps - 2 sets - 5 hold - 1x daily - 7x weekly Seated Isometric Hip Adduction with Ball - 10 reps - 2 sets - 5 hold - 1x daily - 7x  weekly Seated March - 10 reps - 2 sets - 5 hold - 1x daily - 7x weekly    Patient educated on and performed home exercise program for independent performance at home due to patient request that today be his last therapy session. Patient reports he is too busy with conflicts to attend future sessions. I will be happy to see patient again in the future as needed.  PT Education - 05/02/19 1534    Education Details  HEP    Person(s) Educated  Patient    Methods  Explanation;Demonstration;Tactile cues;Verbal cues;Handout    Comprehension  Verbalized understanding;Returned demonstration;Verbal cues required;Tactile cues required       PT Short Term Goals - 04/08/19 1445      PT SHORT TERM GOAL #1   Title  Patient will be independent in home exercise program to improve strength/mobility for better functional independence with ADLs.    Time  4    Period  Weeks    Status  Achieved    Target Date  03/18/19      PT SHORT TERM GOAL #2   Title  Patient will increase Berg Balance score by > 6 points to demonstrate decreased fall risk during functional activities.    Baseline  42/56, 04/08/19=50/56    Time  4        PT Long Term Goals - 04/08/19 1444      PT LONG TERM GOAL #1   Title  Patient will increase ABC scale score >80% to demonstrate better functional mobility and better confidence with ADLs.    Baseline  04/08/19=89%    Time  8    Period  Weeks    Status  Partially Met    Target Date  04/15/19      PT LONG TERM GOAL #2   Title  Patient will increase LEFS score 8 points to demonstrate better functional mobility    Baseline  54/80    Time  8    Period  Weeks    Status  Partially Met    Target Date  04/15/19      PT LONG TERM GOAL #3   Title  Patient will demonstrate an improved Berg Balance Score of  42/56 > as to demonstrate improved balance with ADLs such as sitting/standing and transfer balance and reduced fall risk.    Time  8    Period   Weeks    Status  Partially Met    Target Date  04/15/19            Plan - 05/02/19 1544    Clinical Impression Statement  Patient educated on and performed home exercise program for independent performance at home due to patient request that today be his last therapy session. Patient reports he is too busy with conflicts to attend future sessions. I will be happy to see patient again in the future as needed.    Personal Factors and Comorbidities  Age;Comorbidity 1    Comorbidities  back pain, prostate cancer, knee replacement,    Examination-Activity Limitations  Carry;Dressing    Examination-Participation Restrictions  Yard Work;Church    Rehab Potential  Good    PT Frequency  2x / week    PT Duration  6 weeks    PT Treatment/Interventions  Canalith Repostioning;Neuromuscular re-education;Balance training;Therapeutic exercise;Patient/family education;Therapeutic activities;Passive range of motion    PT Next Visit Plan  balance, therapeutic exercise    PT Home Exercise Plan  heel raises, squats    Consulted and Agree with Plan of Care  Patient       Patient will benefit from skilled therapeutic intervention in order to improve the following deficits and impairments:  Impaired flexibility, Difficulty walking, Decreased safety awareness, Decreased balance, Abnormal gait, Decreased activity tolerance, Decreased endurance, Decreased range of motion, Pain  Visit Diagnosis: Muscle weakness (generalized)  Difficulty in walking, not elsewhere classified  Problem List Patient Active Problem List   Diagnosis Date Noted  . Diplopia 11/07/2018  . Paroxysmal atrial fibrillation ([place]) 11/07/2018  . TIA (transient ischemic attack) 10/20/2018  . SAH (subarachnoid hemorrhage) ([place]) 10/16/2018  . Recurrent sinusitis 10/16/2018  . Low libido 08/30/2018  . Class 1 obesity without serious comorbidity with body mass index (BMI) of 31.0 to 31.9 in adult 05/14/2018  . Mixed hyperlipidemia  05/14/2018  . Degeneration of lumbar intervertebral disc 03/22/2018  . Osteoarthritis of hip 03/22/2018  . Trochanteric bursitis of right hip 03/22/2018  . Status post partial colectomy 01/03/2017  . Depression, major, single episode, mild ([place]) 01/03/2017  . Vasovagal syncope 01/27/2016  . History of skin cancer 10/28/2015  . Osteoarthritis of right knee 10/01/2015  . Essential hypertension 08/25/2015  . Dyspnea on exertion 08/12/2015  . Erectile dysfunction following radiation therapy 08/04/2015  . Constipation 08/02/2015  . History of prostate cancer 01/20/2015  . Swelling of right lower extremity 01/20/2015   Janna Arch, PT, DPT   05/02/2019, 4:01 PM  [place] MAIN [facility] SERVICES [address] [place], [place], [address] Phone: [phone]   Fax:  [phone]  Name: Cameron Foster. MRN: [id number] Date of Birth: Apr 18, 1941

## 2019-05-03 DIAGNOSIS — L299 Pruritus, unspecified: Secondary | ICD-10-CM | POA: Diagnosis not present

## 2019-05-03 DIAGNOSIS — L820 Inflamed seborrheic keratosis: Secondary | ICD-10-CM | POA: Diagnosis not present

## 2019-05-03 DIAGNOSIS — L853 Xerosis cutis: Secondary | ICD-10-CM | POA: Diagnosis not present

## 2019-05-03 DIAGNOSIS — L821 Other seborrheic keratosis: Secondary | ICD-10-CM | POA: Diagnosis not present

## 2019-05-08 ENCOUNTER — Ambulatory Visit: Payer: PRIVATE HEALTH INSURANCE

## 2019-05-09 DIAGNOSIS — H532 Diplopia: Secondary | ICD-10-CM | POA: Diagnosis not present

## 2019-05-09 DIAGNOSIS — R2689 Other abnormalities of gait and mobility: Secondary | ICD-10-CM | POA: Diagnosis not present

## 2019-05-09 DIAGNOSIS — R4189 Other symptoms and signs involving cognitive functions and awareness: Secondary | ICD-10-CM | POA: Diagnosis not present

## 2019-05-09 DIAGNOSIS — Z8673 Personal history of transient ischemic attack (TIA), and cerebral infarction without residual deficits: Secondary | ICD-10-CM | POA: Diagnosis not present

## 2019-05-09 DIAGNOSIS — G4733 Obstructive sleep apnea (adult) (pediatric): Secondary | ICD-10-CM | POA: Diagnosis not present

## 2019-05-14 ENCOUNTER — Ambulatory Visit: Payer: PRIVATE HEALTH INSURANCE

## 2019-05-14 ENCOUNTER — Other Ambulatory Visit: Payer: Self-pay | Admitting: Pulmonary Disease

## 2019-05-15 ENCOUNTER — Ambulatory Visit (INDEPENDENT_AMBULATORY_CARE_PROVIDER_SITE_OTHER): Payer: Medicare Other | Admitting: Family Medicine

## 2019-05-15 ENCOUNTER — Other Ambulatory Visit: Payer: Self-pay

## 2019-05-15 ENCOUNTER — Encounter: Payer: Self-pay | Admitting: Family Medicine

## 2019-05-15 VITALS — BP 122/80 | HR 73 | Temp 97.3°F | Resp 16 | Ht 68.0 in | Wt 221.2 lb

## 2019-05-15 DIAGNOSIS — I480 Paroxysmal atrial fibrillation: Secondary | ICD-10-CM | POA: Diagnosis not present

## 2019-05-15 DIAGNOSIS — D518 Other vitamin B12 deficiency anemias: Secondary | ICD-10-CM

## 2019-05-15 DIAGNOSIS — E669 Obesity, unspecified: Secondary | ICD-10-CM | POA: Diagnosis not present

## 2019-05-15 DIAGNOSIS — E782 Mixed hyperlipidemia: Secondary | ICD-10-CM

## 2019-05-15 DIAGNOSIS — F320 Major depressive disorder, single episode, mild: Secondary | ICD-10-CM

## 2019-05-15 DIAGNOSIS — Z6833 Body mass index (BMI) 33.0-33.9, adult: Secondary | ICD-10-CM

## 2019-05-15 DIAGNOSIS — I10 Essential (primary) hypertension: Secondary | ICD-10-CM

## 2019-05-15 DIAGNOSIS — Z0000 Encounter for general adult medical examination without abnormal findings: Secondary | ICD-10-CM

## 2019-05-15 MED ORDER — FLUTICASONE PROPIONATE 50 MCG/ACT NA SUSP: 1.0000 | Freq: Every day | NASAL | 3 refills | Status: DC | PRN

## 2019-05-15 MED ORDER — ATORVASTATIN CALCIUM 20 MG PO TABS: 20.0000 mg | ORAL_TABLET | Freq: Every day | ORAL | 1 refills | Status: DC

## 2019-05-15 NOTE — Progress Notes (Signed)
Patient: Cameron Foster., Male    DOB: 08/11/1940, 79 y.o.   MRN: [id number] Visit Date: 05/15/2019  Today's Provider: Lavon Paganini, MD   Chief Complaint  Patient presents with  . Medicare Wellness   Subjective:     Annual wellness visit [name]. is a 79 y.o. male. He feels well. He reports exercising none. He reports he is sleeping well. 08/28/2018 PSA 0.1 05/30/2016 Colonoscopy-Diverticulosis, internal hemorrhoids -----------------------------------------------------------   Review of Systems  Constitutional: Negative.   HENT: Positive for postnasal drip, sinus pain and tinnitus.   Eyes: Negative.   Respiratory: Negative.   Cardiovascular: Negative.   Gastrointestinal: Negative.   Endocrine: Negative.   Genitourinary: Negative.   Musculoskeletal: Negative.   Skin: Negative.   Allergic/Immunologic: Negative.   Neurological: Negative.   Hematological: Negative.   Psychiatric/Behavioral: Negative.     Social History   Socioeconomic History  . Marital status: Married    Spouse name: Not on file  . Number of children: 2  . Years of education: Not on file  . Highest education level: Not on file  Occupational History  . Occupation: retired Dance movement psychotherapist  Tobacco Use  . Smoking status: Former Smoker    Packs/day: 1.00    Years: 27.00    Pack years: 27.00    Types: Cigarettes    Quit date: 04/18/1988    Years since quitting: 31.0  . Smokeless tobacco: Former Systems developer    Types: Chew  Substance and Sexual Activity  . Alcohol use: Yes    Alcohol/week: 3.0 standard drinks    Types: 3 Standard drinks or equivalent per week    Comment: Occasional- Former Heavy ETOH Use  . Drug use: No  . Sexual activity: Not Currently  Other Topics Concern  . Not on file  Social History Narrative  . Not on file   Social Determinants of Health   Financial Resource Strain:   . Difficulty of Paying Living Expenses: Not on file  Food Insecurity:   . Worried  About Charity fundraiser in the Last Year: Not on file  . Ran Out of Food in the Last Year: Not on file  Transportation Needs:   . Lack of Transportation (Medical): Not on file  . Lack of Transportation (Non-Medical): Not on file  Physical Activity:   . Days of Exercise per Week: Not on file  . Minutes of Exercise per Session: Not on file  Stress:   . Feeling of Stress : Not on file  Social Connections:   . Frequency of Communication with Friends and Family: Not on file  . Frequency of Social Gatherings with Friends and Family: Not on file  . Attends Religious Services: Not on file  . Active Member of Clubs or Organizations: Not on file  . Attends Archivist Meetings: Not on file  . Marital Status: Not on file  Intimate Partner Violence:   . Fear of Current or Ex-Partner: Not on file  . Emotionally Abused: Not on file  . Physically Abused: Not on file  . Sexually Abused: Not on file    Past Medical History:  Diagnosis Date  . Alcoholism ([place])   . Arthritis   . Atrial fibrillation ([place])    one episode  . Colon polyp   . Depression    Son died 06-27-2014  . Diverticulitis   . Diverticulosis 30 years  . Dysrhythmia    At Fib 1995  . GERD (  gastroesophageal reflux disease)   . Hyperlipidemia   . Hypertension   . Irritable bowel syndrome   . Prostate cancer ([place]) 2012   treated with radiation therapy. Prostate  . Vasovagal syncope      Patient Active Problem List   Diagnosis Date Noted  . Diplopia 11/07/2018  . Paroxysmal atrial fibrillation ([place]) 11/07/2018  . TIA (transient ischemic attack) 10/20/2018  . SAH (subarachnoid hemorrhage) ([place]) 10/16/2018  . Recurrent sinusitis 10/16/2018  . Low libido 08/30/2018  . Obesity 05/14/2018  . Mixed hyperlipidemia 05/14/2018  . Degeneration of lumbar intervertebral disc 03/22/2018  . Osteoarthritis of hip 03/22/2018  . Trochanteric bursitis of right hip 03/22/2018  . Status post partial colectomy 01/03/2017  .  Depression, major, single episode, mild ([place]) 01/03/2017  . Vasovagal syncope 01/27/2016  . History of skin cancer 10/28/2015  . Osteoarthritis of right knee 10/01/2015  . Essential hypertension 08/25/2015  . Dyspnea on exertion 08/12/2015  . Erectile dysfunction following radiation therapy 08/04/2015  . Constipation 08/02/2015  . History of prostate cancer 01/20/2015  . Swelling of right lower extremity 01/20/2015    Past Surgical History:  Procedure Laterality Date  . [place] no stents  . CATARACT EXTRACTION, BILATERAL    . COLON RESECTION SIGMOID N/A 12/07/2016   Procedure: COLON RESECTION SIGMOID;  Surgeon: Clayburn Pert, MD;  Location: [facility] ORS;  Service: General;  Laterality: N/A;  . COLON SURGERY  11/2016   Colostomy  . COLONOSCOPY  2016  . COLONOSCOPY WITH PROPOFOL N/A 05/30/2016   Procedure: COLONOSCOPY WITH PROPOFOL;  Surgeon: Lollie Sails, MD;  Location: [facility] ENDOSCOPY;  Service: Endoscopy;  Laterality: N/A;  . COLOSTOMY Left 12/07/2016   Procedure: COLOSTOMY;  Surgeon: Clayburn Pert, MD;  Location: [facility] ORS;  Service: General;  Laterality: Left;  . COLOSTOMY REVERSAL N/A 03/21/2017   Procedure: COLOSTOMY REVERSAL;  Surgeon: Clayburn Pert, MD;  Location: [facility] ORS;  Service: General;  Laterality: N/A;  . COLOSTOMY TAKEDOWN N/A 03/21/2017   Procedure: LAPAROSCOPIC COLOSTOMY TAKEDOWN;  Surgeon: Clayburn Pert, MD;  Location: [facility] ORS;  Service: General;  Laterality: N/A;  . CYSTOSCOPY WITH STENT PLACEMENT Bilateral 03/21/2017   Procedure: CYSTOSCOPY WITH LIGHTED STENT PLACEMENT;  Surgeon: Abbie Sons, MD;  Location: [facility] ORS;  Service: Urology;  Laterality: Bilateral;  . ESOPHAGOGASTRODUODENOSCOPY    . ESOPHAGOGASTRODUODENOSCOPY N/A 05/30/2016   Procedure: ESOPHAGOGASTRODUODENOSCOPY (EGD);  Surgeon: Lollie Sails, MD;  Location[facility]COPY;  Service: Endoscopy;  Laterality: N/A;  . EYE SURGERY     cataracts  .  FLEXIBLE SIGMOIDOSCOPY N/A 03/21/2017   Procedure: FLEXIBLE SIGMOIDOSCOPY;  Surgeon: Clayburn Pert, MD;  Location: [facility] ORS;  Service: General;  Laterality: N/A;  . FRACTURE SURGERY Bilateral    right arm and left wrist  . INCISION AND DRAINAGE ABSCESS N/A 12/07/2016   Procedure: DRAINAGE  OF INTRA ABDOMINAL ABSCESS;  Surgeon: Clayburn Pert, MD;  Location: [facility] ORS;  Service: General;  Laterality: N/A;  . KNEE ARTHROSCOPY    . LAPAROTOMY N/A 12/07/2016   Procedure: EXPLORATORY LAPAROTOMY;  Surgeon: Clayburn Pert, MD;  Location: [facility] ORS;  Service: General;  Laterality: N/A;  . PROSTATE SURGERY     Microwave therapy  . TONSILLECTOMY    . TOTAL KNEE ARTHROPLASTY Right 07/27/2016   Procedure: TOTAL KNEE ARTHROPLASTY;  Surgeon: [name], MD;  Location: [facility] ORS;  Service: Orthopedics;  Laterality: Right;  Dr. Erlene Quan had to place Urinary catheter due to prostate cancer history.  Using flexible scope.  His family history includes Bipolar disorder in his son; Kidney disease in his daughter; Lung cancer in his father; Other in his mother; Sudden death in his son. There is no history of Prostate cancer or Bladder Cancer.   Current Outpatient Medications:  .  amLODipine (NORVASC) 10 MG tablet, TAKE 1 TABLET BY MOUTH EVERY DAY, Disp: 90 tablet, Rfl: 1 .  aspirin 81 MG EC tablet, Take by mouth., Disp: , Rfl:  .  atorvastatin (LIPITOR) 20 MG tablet, Take 1 tablet (20 mg total) by mouth daily at 6 PM., Disp: 90 tablet, Rfl: 1 .  docusate sodium (STOOL SOFTENER) 100 MG capsule, Take 100 mg by mouth 2 (two) times daily., Disp: , Rfl:  .  fluticasone (FLONASE) 50 MCG/ACT nasal spray, Place 1 spray into both nostrils daily as needed for allergies or rhinitis., Disp: 16 g, Rfl: 3 .  furosemide (LASIX) 20 MG tablet, Take 1 tablet (20 mg total) by mouth daily., Disp: 30 tablet, Rfl: 0 .  lisinopril (ZESTRIL) 10 MG tablet, TAKE 1 TABLET BY MOUTH EVERY DAY, Disp: 90 tablet, Rfl: 1 .  meclizine  (ANTIVERT) 25 MG tablet, Take 25 mg by mouth 2 (two) times daily as needed., Disp: , Rfl:  .  meloxicam (MOBIC) 15 MG tablet, Take 15 mg by mouth daily. , Disp: , Rfl:  .  metoprolol succinate (TOPROL-XL) 50 MG 24 hr tablet, TAKE 1 TABLET (50 MG TOTAL) BY MOUTH DAILY. TAKE WITH OR IMMEDIATELY FOLLOWING A MEAL., Disp: 90 tablet, Rfl: 1 .  Multiple Vitamin (MULTIVITAMIN WITH MINERALS) TABS tablet, Take 1 tablet by mouth daily., Disp: 30 tablet, Rfl: 0 .  omeprazole (PRILOSEC) 20 MG capsule, TAKE 1 CAPSULE (20 MG TOTAL) BY MOUTH DAILY AS NEEDED (HEARTBURN)., Disp: 90 capsule, Rfl: 1 .  sertraline (ZOLOFT) 100 MG tablet, Take 1 tablet (100 mg total) by mouth daily., Disp: 90 tablet, Rfl: 3  Patient Care Team: Shondrea Steinert, Dionne Bucy, MD as PCP - General (Family Medicine) Leone Haven, MD as Consulting Physician (Family Medicine) Bary Castilla, Forest Gleason, MD (General Surgery) Wellington Hampshire, MD as Consulting Physician (Cardiology)    Objective:    Vitals: BP 122/80 (BP Location: Left Arm, Patient Position: Sitting, Cuff Size: Normal)   Pulse 73   Temp (!) 97.3 F (36.3 C) (Temporal)   Resp 16   Ht 5' 8"  (1.727 m)   Wt 221 lb 3.2 oz (100.3 kg)   BMI 33.63 kg/m   Physical Exam Vitals reviewed.  Constitutional:      General: He is not in acute distress.    Appearance: Normal appearance. He is well-developed. He is not diaphoretic.  HENT:     Head: Normocephalic and atraumatic.     Right Ear: External ear normal.     Left Ear: External ear normal.  Eyes:     General: No scleral icterus.    Conjunctiva/sclera: Conjunctivae normal.     Pupils: Pupils are equal, round, and reactive to light.  Neck:     Thyroid: No thyromegaly.  Cardiovascular:     Rate and Rhythm: Normal rate and regular rhythm.     Heart sounds: Normal heart sounds. No murmur.  Pulmonary:     Effort: Pulmonary effort is normal. No respiratory distress.     Breath sounds: Normal breath sounds. No wheezing or rales.    Abdominal:     General: There is no distension.     Palpations: Abdomen is soft.     Tenderness: There is  no abdominal tenderness. There is no guarding or rebound.  Musculoskeletal:        General: No deformity.     Cervical back: Neck supple.     Right lower leg: No edema.     Left lower leg: No edema.  Lymphadenopathy:     Cervical: No cervical adenopathy.  Skin:    General: Skin is warm and dry.     Capillary Refill: Capillary refill takes less than 2 seconds.     Findings: No rash.  Neurological:     Mental Status: He is alert and oriented to person, place, and time. Mental status is at baseline.  Psychiatric:        Mood and Affect: Mood normal.        Behavior: Behavior normal.        Thought Content: Thought content normal.     Activities of Daily Living In your present state of health, do you have any difficulty performing the following activities: 05/15/2019 10/21/2018  Hearing? N Y  Vision? N N  Difficulty concentrating or making decisions? Y N  Walking or climbing stairs? Y Y  Dressing or bathing? N N  Doing errands, shopping? Y N  Some recent data might be hidden    Fall Risk Assessment Fall Risk  05/15/2019 05/14/2018 03/05/2018 09/02/2016 06/24/2016  Falls in the past year? 0 1 1 Yes No  Comment - - Emmi Telephone Survey: data to providers prior to load - -  Number falls in past yr: 0 1 1 1  -  Comment - - Emmi Telephone Survey Actual Response = 2 - -  Injury with Fall? 0 1 1 Yes -  Comment - - - Followed by PCP -  Risk for fall due to : No Fall Risks - - Impaired balance/gait -  Follow up Falls evaluation completed - - Education provided;Falls prevention discussed -     Depression Screen PHQ 2/9 Scores 05/15/2019 05/14/2018 09/02/2016 06/24/2016  PHQ - 2 Score 0 0 0 0  PHQ- 9 Score 2 0 0 -    6CIT Screen 09/02/2016  What Year? 0 points  What month? 0 points  What time? 0 points  Count back from 20 0 points  Months in reverse 0 points  Repeat phrase 0  points  Total Score 0   Audit-C Alcohol Use Screening   Alcohol Use Disorder Test (AUDIT) 05/14/2018 05/15/2019  1. How often do you have a drink containing alcohol? 2 3  2. How many drinks containing alcohol do you have on a typical day when you are drinking? 0 1  3. How often do you have six or more drinks on one occasion? 0 0  AUDIT-C Score 2 4  4. How often during the last year have you found that you were not able to stop drinking once you had started? - 0  5. How often during the last year have you failed to do what was normally expected from you becasue of drinking? - 0  6. How often during the last year have you needed a first drink in the morning to get yourself going after a heavy drinking session? - 0  7. How often during the last year have you had a feeling of guilt of remorse after drinking? - 0  8. How often during the last year have you been unable to remember what happened the night before because you had been drinking? - 0  9. Have you or someone else been injured  as a result of your drinking? - 0  10. Has a relative or friend or a doctor or another health worker been concerned about your drinking or suggested you cut down? - 0  Alcohol Use Disorder Identification Test Final Score (AUDIT) - 4  Alcohol Brief Interventions/Follow-up - AUDIT Score <7 follow-up not indicated    A score of 3 or more in women, and 4 or more in men indicates increased risk for alcohol abuse, EXCEPT if all of the points are from question 1     Assessment & Plan:     Annual Wellness Visit  Reviewed patient's Family Medical History Reviewed and updated list of patient's medical providers Assessment of cognitive impairment was done Assessed patient's functional ability Established a written schedule for health screening [place] Completed and Reviewed  Exercise Activities and Dietary recommendations Goals    .  Increase physical activity     Complete physical therapy  exercises.  Continue proper range of motion at home.       Immunization History  Administered Date(s) Administered  . Influenza, High Dose Seasonal PF 01/26/2017, 02/01/2018, 01/31/2019  . Influenza,inj,Quad PF,6+ Mos 02/08/2016  . Influenza,inj,quad, With Preservative 12/18/2014  . Pneumococcal Conjugate-13 05/14/2018  . Pneumococcal Polysaccharide-23 12/18/2014  . Pneumococcal-Unspecified 12/18/2014    Health Maintenance  Topic Date Due  . TETANUS/TDAP  05/14/2022 (Originally 09/01/1959)  . COLONOSCOPY  05/31/2019  . INFLUENZA VACCINE  Completed  . PNA vac Low Risk Adult  Completed     Discussed health benefits of physical activity, and encouraged him to engage in regular exercise appropriate for his age and condition.    ------------------------------------------------------------------------------------------------------------  Problem List Items Addressed This Visit      Cardiovascular and Mediastinum   Essential hypertension    Well controlled Continue current medications Recheck metabolic panel F/u in 6 months       Relevant Medications   aspirin 81 MG EC tablet   atorvastatin (LIPITOR) 20 MG tablet   Other Relevant Orders   Comprehensive metabolic panel   Paroxysmal atrial fibrillation (HCC)    Chronic and stable Followed by Cardiology Continue Aspirin and Metoprolol      Relevant Medications   aspirin 81 MG EC tablet   atorvastatin (LIPITOR) 20 MG tablet   Other Relevant Orders   CBC     Other   Depression, major, single episode, mild (HCC)    Currently in remission Doing well on Zoloft 125m daily discussed attempting taper, but patient prefers to continue for now Will reassess at future visits Contracted for safety - no SI/HI      Obesity    Discussed importance of healthy weight management Discussed diet and exercise       Relevant Orders   Comprehensive metabolic panel   Mixed hyperlipidemia    Chronic Reviewed last FLP from [facility]  Chart with LDL 107 Discussed goal LDL <70 given h/o stroke Increase Atorvastatin to 258mdaily F/u in 3 months and repeat FLP      Relevant Medications   aspirin 81 MG EC tablet   atorvastatin (LIPITOR) 20 MG tablet   Other Relevant Orders   Comprehensive metabolic panel    Other Visit Diagnoses    Encounter for annual wellness visit (AWV) in Medicare patient    -  Primary   Other vitamin B12 deficiency anemia       Relevant Orders   CBC       Return in about 3 months (around 08/13/2019)  for chronic disease f/u.   The entirety of the information documented in the History of Present Illness, Review of Systems and Physical Exam were personally obtained by me. Portions of this information were initially documented by Lynford Humphrey, CMA and reviewed by me for thoroughness and accuracy.    Adlai Sinning, Dionne Bucy, MD MPH [facility]

## 2019-05-15 NOTE — Assessment & Plan Note (Signed)
Chronic Reviewed last FLP from [facility] Chart with LDL 107 Discussed goal LDL <70 given h/o stroke Increase Atorvastatin to 63m daily F/u in 3 months and repeat FLP

## 2019-05-15 NOTE — Assessment & Plan Note (Signed)
Discussed importance of healthy weight management Discussed diet and exercise  

## 2019-05-15 NOTE — Assessment & Plan Note (Signed)
Well controlled Continue current medications Recheck metabolic panel F/u in 6 months  

## 2019-05-15 NOTE — Assessment & Plan Note (Signed)
Currently in remission Doing well on Zoloft 145m daily discussed attempting taper, but patient prefers to continue for now Will reassess at future visits Contracted for safety - no SI/HI

## 2019-05-15 NOTE — Assessment & Plan Note (Signed)
Chronic and stable Followed by Cardiology Continue Aspirin and Metoprolol

## 2019-05-15 NOTE — Patient Instructions (Signed)
Preventive Care 42 Years and Older, Male Preventive care refers to lifestyle choices and visits with your health care provider that can promote health and wellness. This includes:  A yearly physical exam. This is also called an annual well check.  Regular dental and eye exams.  Immunizations.  Screening for certain conditions.  Healthy lifestyle choices, such as diet and exercise. What can I expect for my preventive care visit? Physical exam Your health care provider will check:  Height and weight. These may be used to calculate body mass index (BMI), which is a measurement that tells if you are at a healthy weight.  Heart rate and blood pressure.  Your skin for abnormal spots. Counseling Your health care provider may ask you questions about:  Alcohol, tobacco, and drug use.  Emotional well-being.  Home and relationship well-being.  Sexual activity.  Eating habits.  History of falls.  Memory and ability to understand (cognition).  Work and work Statistician. What immunizations do I need?  Influenza (flu) vaccine  This is recommended every year. Tetanus, diphtheria, and pertussis (Tdap) vaccine  You may need a Td booster every 10 years. Varicella (chickenpox) vaccine  You may need this vaccine if you have not already been vaccinated. Zoster (shingles) vaccine  You may need this after age 79. Pneumococcal conjugate (PCV13) vaccine  One dose is recommended after age 50. Pneumococcal polysaccharide (PPSV23) vaccine  One dose is recommended after age 46. Measles, mumps, and rubella (MMR) vaccine  You may need at least one dose of MMR if you were born in 1957 or later. You may also need a second dose. Meningococcal conjugate (MenACWY) vaccine  You may need this if you have certain conditions. Hepatitis A vaccine  You may need this if you have certain conditions or if you travel or work in places where you may be exposed to hepatitis A. Hepatitis B  vaccine  You may need this if you have certain conditions or if you travel or work in places where you may be exposed to hepatitis B. Haemophilus influenzae type b (Hib) vaccine  You may need this if you have certain conditions. You may receive vaccines as individual doses or as more than one vaccine together in one shot (combination vaccines). Talk with your health care provider about the risks and benefits of combination vaccines. What tests do I need? Blood tests  Lipid and cholesterol levels. These may be checked every 5 years, or more frequently depending on your overall health.  Hepatitis C test.  Hepatitis B test. Screening  Lung cancer screening. You may have this screening every year starting at age 79 if you have a 30-pack-year history of smoking and currently smoke or have quit within the past 15 years.  Colorectal cancer screening. All adults should have this screening starting at age 79 and continuing until age 14. Your health care provider may recommend screening at age 4 if you are at increased risk. You will have tests every 1-10 years, depending on your results and the type of screening test.  Prostate cancer screening. Recommendations will vary depending on your family history and other risks.  Diabetes screening. This is done by checking your blood sugar (glucose) after you have not eaten for a while (fasting). You may have this done every 1-3 years.  Abdominal aortic aneurysm (AAA) screening. You may need this if you are a current or former smoker.  Sexually transmitted disease (STD) testing. Follow these instructions at home: Eating and drinking  Eat  a diet that includes fresh fruits and vegetables, whole grains, lean protein, and low-fat dairy products. Limit your intake of foods with high amounts of sugar, saturated fats, and salt.  Take vitamin and mineral supplements as recommended by your health care provider.  Do not drink alcohol if your health care  provider tells you not to drink.  If you drink alcohol: ? Limit how much you have to 0-2 drinks a day. ? Be aware of how much alcohol is in your drink. In the [place] one drink equals one 12 oz bottle of beer (355 mL), one 5 oz glass of wine (148 mL), or one 1 oz glass of hard liquor (44 mL). Lifestyle  Take daily care of your teeth and gums.  Stay active. Exercise for at least 30 minutes on 5 or more days each week.  Do not use any products that contain nicotine or tobacco, such as cigarettes, e-cigarettes, and chewing tobacco. If you need help quitting, ask your health care provider.  If you are sexually active, practice safe sex. Use a condom or other form of protection to prevent STIs (sexually transmitted infections).  Talk with your health care provider about taking a low-dose aspirin or statin. What's next?  Visit your health care provider once a year for a well check visit.  Ask your health care provider how often you should have your eyes and teeth checked.  Stay up to date on all vaccines. This information is not intended to replace advice given to you by your health care provider. Make sure you discuss any questions you have with your health care provider. Document Revised: 03/29/2018 Document Reviewed: 03/29/2018 Elsevier Patient Education  2020 [employer].

## 2019-05-16 ENCOUNTER — Ambulatory Visit: Payer: PRIVATE HEALTH INSURANCE | Admitting: Physical Therapy

## 2019-05-16 ENCOUNTER — Telehealth: Payer: Self-pay

## 2019-05-16 LAB — COMPREHENSIVE METABOLIC PANEL
ALT: 11 IU/L (ref 0–44)
AST: 19 IU/L (ref 0–40)
Albumin/Globulin Ratio: 1.5 (ref 1.2–2.2)
Albumin: 4.2 g/dL (ref 3.7–4.7)
Alkaline Phosphatase: 88 IU/L (ref 39–117)
BUN/Creatinine Ratio: 13 (ref 10–24)
BUN: 17 mg/dL (ref 8–27)
Bilirubin Total: 0.3 mg/dL (ref 0.0–1.2)
CO2: 24 mmol/L (ref 20–29)
Calcium: 9.5 mg/dL (ref 8.6–10.2)
Chloride: 103 mmol/L (ref 96–106)
Creatinine, Ser: 1.28 mg/dL — ABNORMAL HIGH (ref 0.76–1.27)
GFR calc Af Amer: 62 mL/min/{1.73_m2} (ref >59)
GFR calc non Af Amer: 53 mL/min/{1.73_m2} — ABNORMAL LOW (ref >59)
Globulin, Total: 2.8 g/dL (ref 1.5–4.5)
Glucose: 94 mg/dL (ref 65–99)
Potassium: 3.9 mmol/L (ref 3.5–5.2)
Sodium: 140 mmol/L (ref 134–144)
Total Protein: 7 g/dL (ref 6.0–8.5)

## 2019-05-16 LAB — CBC
Hematocrit: 36.3 % — ABNORMAL LOW (ref 37.5–51.0)
Hemoglobin: 12.3 g/dL — ABNORMAL LOW (ref 13.0–17.7)
MCH: 30.2 pg (ref 26.6–33.0)
MCHC: 33.9 g/dL (ref 31.5–35.7)
MCV: 89 fL (ref 79–97)
Platelets: 237 10*3/uL (ref 150–450)
RBC: 4.07 x10E6/uL — ABNORMAL LOW (ref 4.14–5.80)
RDW: 12.7 % (ref 11.6–15.4)
WBC: 7.3 10*3/uL (ref 3.4–10.8)

## 2019-05-16 NOTE — Telephone Encounter (Signed)
Comment seen by patient Suzan Slick. on 05/16/2019 9:20 AM EST

## 2019-05-16 NOTE — Telephone Encounter (Signed)
-----   Message from [name], MD sent at 05/16/2019  8:25 AM EST ----- Normal/stable labs

## 2019-05-21 ENCOUNTER — Ambulatory Visit: Payer: PRIVATE HEALTH INSURANCE | Attending: Internal Medicine

## 2019-05-21 DIAGNOSIS — R112 Nausea with vomiting, unspecified: Secondary | ICD-10-CM | POA: Diagnosis not present

## 2019-05-21 DIAGNOSIS — R197 Diarrhea, unspecified: Secondary | ICD-10-CM | POA: Diagnosis not present

## 2019-05-21 DIAGNOSIS — Z03818 Encounter for observation for suspected exposure to other biological agents ruled out: Secondary | ICD-10-CM | POA: Diagnosis not present

## 2019-06-01 ENCOUNTER — Ambulatory Visit: Payer: PRIVATE HEALTH INSURANCE

## 2019-06-08 ENCOUNTER — Other Ambulatory Visit: Payer: Self-pay

## 2019-06-08 ENCOUNTER — Emergency Department: Payer: Medicare Other

## 2019-06-08 ENCOUNTER — Inpatient Hospital Stay
Admission: EM | Admit: 2019-06-08 | Discharge: 2019-06-10 | DRG: 066 | Disposition: A | Discharge status: Home / Self Care | Payer: Medicare Other | Attending: Hospitalist | Admitting: Hospitalist

## 2019-06-08 DIAGNOSIS — Z86010 Personal history of colonic polyps: Secondary | ICD-10-CM

## 2019-06-08 DIAGNOSIS — M720 Palmar fascial fibromatosis [Dupuytren]: Secondary | ICD-10-CM

## 2019-06-08 DIAGNOSIS — G629 Polyneuropathy, unspecified: Secondary | ICD-10-CM | POA: Diagnosis present

## 2019-06-08 DIAGNOSIS — I480 Paroxysmal atrial fibrillation: Secondary | ICD-10-CM | POA: Diagnosis present

## 2019-06-08 DIAGNOSIS — I4891 Unspecified atrial fibrillation: Secondary | ICD-10-CM

## 2019-06-08 DIAGNOSIS — I10 Essential (primary) hypertension: Secondary | ICD-10-CM | POA: Diagnosis present

## 2019-06-08 DIAGNOSIS — Z8673 Personal history of transient ischemic attack (TIA), and cerebral infarction without residual deficits: Secondary | ICD-10-CM

## 2019-06-08 DIAGNOSIS — Z8546 Personal history of malignant neoplasm of prostate: Secondary | ICD-10-CM

## 2019-06-08 DIAGNOSIS — I6381 Other cerebral infarction due to occlusion or stenosis of small artery: Principal | ICD-10-CM | POA: Diagnosis present

## 2019-06-08 DIAGNOSIS — G459 Transient cerebral ischemic attack, unspecified: Secondary | ICD-10-CM | POA: Diagnosis not present

## 2019-06-08 DIAGNOSIS — F329 Major depressive disorder, single episode, unspecified: Secondary | ICD-10-CM | POA: Diagnosis present

## 2019-06-08 DIAGNOSIS — I639 Cerebral infarction, unspecified: Secondary | ICD-10-CM | POA: Diagnosis not present

## 2019-06-08 DIAGNOSIS — Z96651 Presence of right artificial knee joint: Secondary | ICD-10-CM | POA: Diagnosis present

## 2019-06-08 DIAGNOSIS — E785 Hyperlipidemia, unspecified: Secondary | ICD-10-CM | POA: Diagnosis present

## 2019-06-08 DIAGNOSIS — E782 Mixed hyperlipidemia: Secondary | ICD-10-CM | POA: Diagnosis present

## 2019-06-08 DIAGNOSIS — Z801 Family history of malignant neoplasm of trachea, bronchus and lung: Secondary | ICD-10-CM

## 2019-06-08 DIAGNOSIS — R29700 NIHSS score 0: Secondary | ICD-10-CM | POA: Diagnosis present

## 2019-06-08 DIAGNOSIS — Z6833 Body mass index (BMI) 33.0-33.9, adult: Secondary | ICD-10-CM

## 2019-06-08 DIAGNOSIS — Z888 Allergy status to other drugs, medicaments and biological substances status: Secondary | ICD-10-CM

## 2019-06-08 DIAGNOSIS — Z20822 Contact with and (suspected) exposure to covid-19: Secondary | ICD-10-CM | POA: Diagnosis not present

## 2019-06-08 DIAGNOSIS — Z923 Personal history of irradiation: Secondary | ICD-10-CM

## 2019-06-08 DIAGNOSIS — K219 Gastro-esophageal reflux disease without esophagitis: Secondary | ICD-10-CM | POA: Diagnosis present

## 2019-06-08 DIAGNOSIS — E669 Obesity, unspecified: Secondary | ICD-10-CM | POA: Diagnosis present

## 2019-06-08 DIAGNOSIS — Z87891 Personal history of nicotine dependence: Secondary | ICD-10-CM

## 2019-06-08 DIAGNOSIS — Z7982 Long term (current) use of aspirin: Secondary | ICD-10-CM

## 2019-06-08 DIAGNOSIS — R29898 Other symptoms and signs involving the musculoskeletal system: Secondary | ICD-10-CM

## 2019-06-08 DIAGNOSIS — R531 Weakness: Secondary | ICD-10-CM | POA: Diagnosis not present

## 2019-06-08 DIAGNOSIS — Z9841 Cataract extraction status, right eye: Secondary | ICD-10-CM

## 2019-06-08 DIAGNOSIS — Z791 Long term (current) use of non-steroidal anti-inflammatories (NSAID): Secondary | ICD-10-CM

## 2019-06-08 DIAGNOSIS — Z880 Allergy status to penicillin: Secondary | ICD-10-CM

## 2019-06-08 DIAGNOSIS — Z9842 Cataract extraction status, left eye: Secondary | ICD-10-CM

## 2019-06-08 DIAGNOSIS — M21332 Wrist drop, left wrist: Secondary | ICD-10-CM | POA: Diagnosis present

## 2019-06-08 DIAGNOSIS — I6523 Occlusion and stenosis of bilateral carotid arteries: Secondary | ICD-10-CM | POA: Diagnosis present

## 2019-06-08 DIAGNOSIS — Z79899 Other long term (current) drug therapy: Secondary | ICD-10-CM

## 2019-06-08 HISTORY — DX: Other cerebral infarction due to occlusion or stenosis of small artery: I63.81

## 2019-06-08 LAB — COMPREHENSIVE METABOLIC PANEL
ALT: 15 U/L (ref 0–44)
AST: 27 U/L (ref 15–41)
Albumin: 3.9 g/dL (ref 3.5–5.0)
Alkaline Phosphatase: 75 U/L (ref 38–126)
Anion gap: 10 (ref 5–15)
BUN: 21 mg/dL (ref 8–23)
CO2: 23 mmol/L (ref 22–32)
Calcium: 9.3 mg/dL (ref 8.9–10.3)
Chloride: 106 mmol/L (ref 98–111)
Creatinine, Ser: 1.39 mg/dL — ABNORMAL HIGH (ref 0.61–1.24)
GFR calc Af Amer: 56 mL/min — ABNORMAL LOW (ref >60)
GFR calc non Af Amer: 48 mL/min — ABNORMAL LOW (ref >60)
Glucose, Bld: 167 mg/dL — ABNORMAL HIGH (ref 70–99)
Potassium: 3.6 mmol/L (ref 3.5–5.1)
Sodium: 139 mmol/L (ref 135–145)
Total Bilirubin: 0.8 mg/dL (ref 0.3–1.2)
Total Protein: 7.2 g/dL (ref 6.5–8.1)

## 2019-06-08 LAB — DIFFERENTIAL
Abs Immature Granulocytes: 0.02 10*3/uL (ref 0.00–0.07)
Basophils Absolute: 0 10*3/uL (ref 0.0–0.1)
Basophils Relative: 1 %
Eosinophils Absolute: 0.3 10*3/uL (ref 0.0–0.5)
Eosinophils Relative: 4 %
Immature Granulocytes: 0 %
Lymphocytes Relative: 15 %
Lymphs Abs: 1 10*3/uL (ref 0.7–4.0)
Monocytes Absolute: 0.4 10*3/uL (ref 0.1–1.0)
Monocytes Relative: 6 %
Neutro Abs: 5.1 10*3/uL (ref 1.7–7.7)
Neutrophils Relative %: 74 %

## 2019-06-08 LAB — CBC
HCT: 39 % (ref 39.0–52.0)
Hemoglobin: 12.4 g/dL — ABNORMAL LOW (ref 13.0–17.0)
MCH: 29 pg (ref 26.0–34.0)
MCHC: 31.8 g/dL (ref 30.0–36.0)
MCV: 91.1 fL (ref 80.0–100.0)
Platelets: 239 10*3/uL (ref 150–400)
RBC: 4.28 MIL/uL (ref 4.22–5.81)
RDW: 12.9 % (ref 11.5–15.5)
WBC: 6.9 10*3/uL (ref 4.0–10.5)
nRBC: 0 % (ref 0.0–0.2)

## 2019-06-08 LAB — URINALYSIS, COMPLETE (UACMP) WITH MICROSCOPIC
Bacteria, UA: NONE SEEN
Bilirubin Urine: NEGATIVE
Glucose, UA: NEGATIVE mg/dL
Hgb urine dipstick: NEGATIVE
Ketones, ur: NEGATIVE mg/dL
Leukocytes,Ua: NEGATIVE
Nitrite: NEGATIVE
Protein, ur: NEGATIVE mg/dL
Specific Gravity, Urine: 1.015 (ref 1.005–1.030)
Squamous Epithelial / HPF: NONE SEEN (ref 0–5)
pH: 5 (ref 5.0–8.0)

## 2019-06-08 LAB — APTT: aPTT: 36 seconds (ref 24–36)

## 2019-06-08 LAB — PROTIME-INR
INR: 1 (ref 0.8–1.2)
Prothrombin Time: 13.4 seconds (ref 11.4–15.2)

## 2019-06-08 IMAGING — CT CT HEAD W/O CM
3 series · 15 of 46 positions shown, 18 images · non-contrast
Comparison: CT head dated [DATE]. MRI brain dated [DATE].

CLINICAL DATA: TIA, possible stroke

EXAM:
CT HEAD WITHOUT CONTRAST
TECHNIQUE: Contiguous axial images were obtained from the base of the skull
through the vertex without intravenous contrast.

[Series 2: head wo · axial · 0.47mm/px · z∈[-148,-28]mm · 9 of 29 slices shown, 12 images]
[im 3/29  brain]
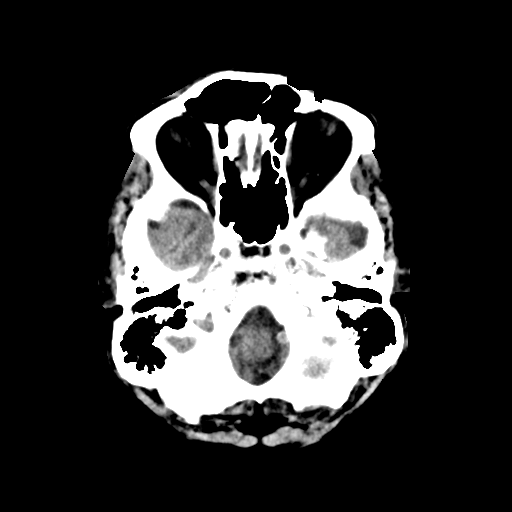
[im 3/29  bone]
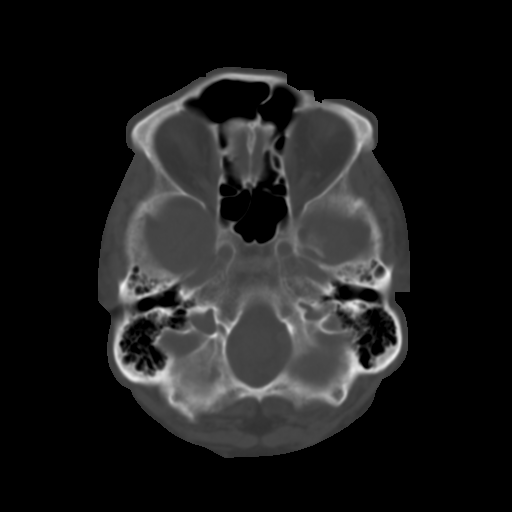
[im 6/29  brain]
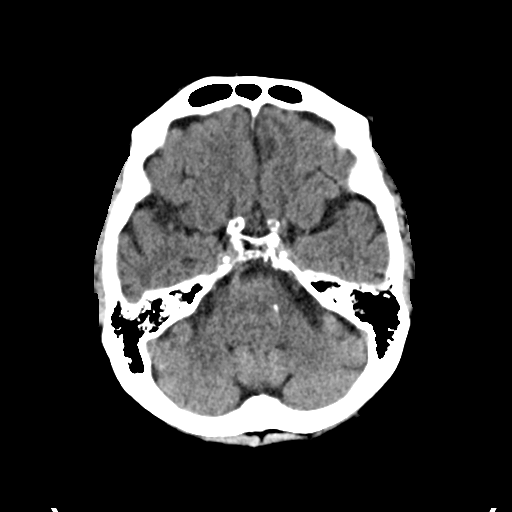
[im 9/29  brain]
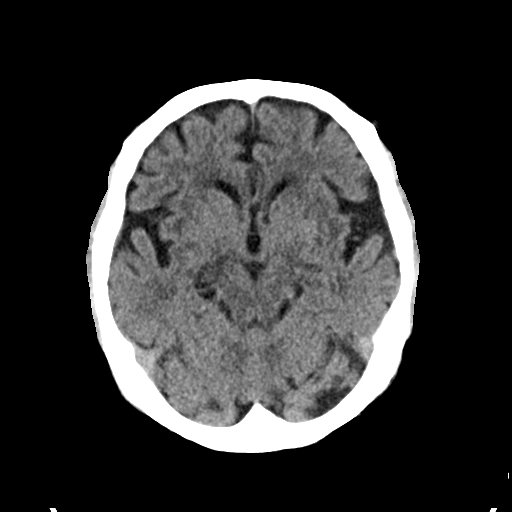
[im 12/29  brain]
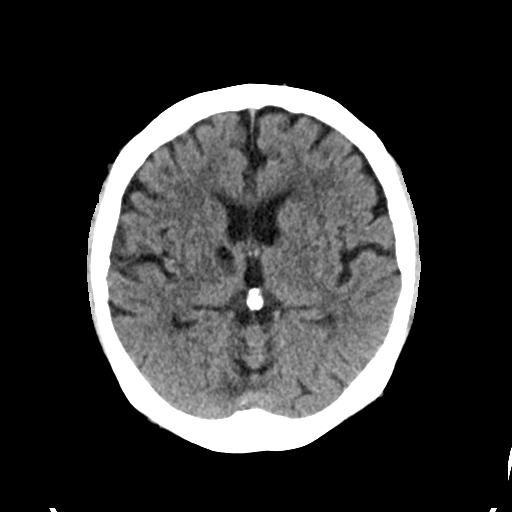
[im 15/29  brain]
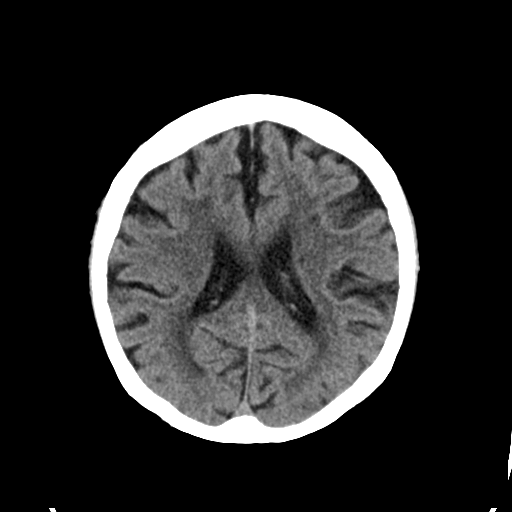
[im 15/29  bone]
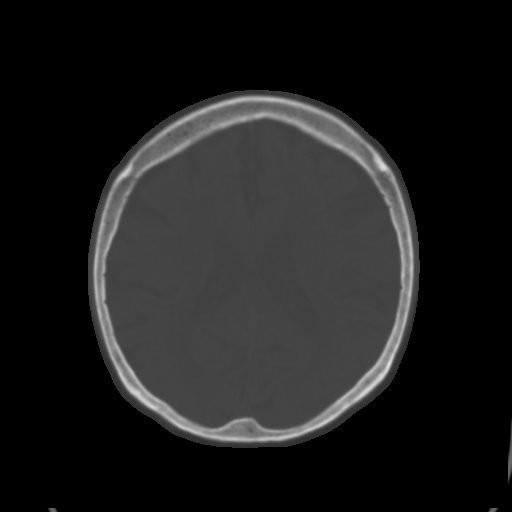
[im 18/29  brain]
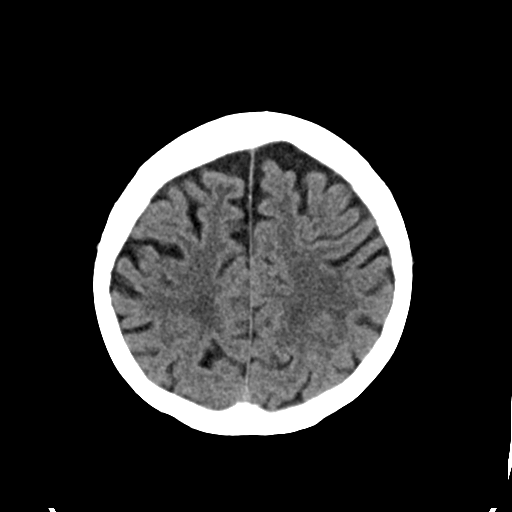
[im 21/29  brain]
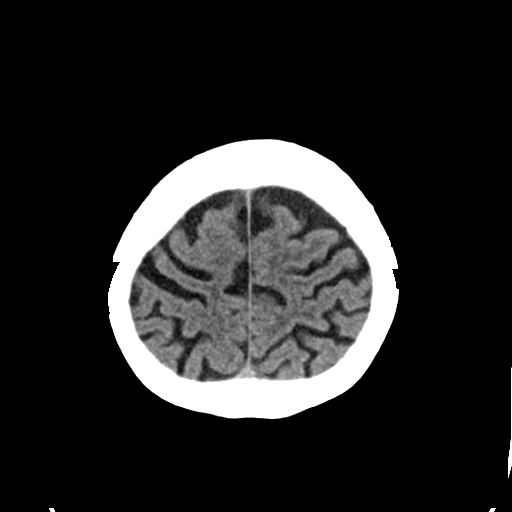
[im 24/29  brain]
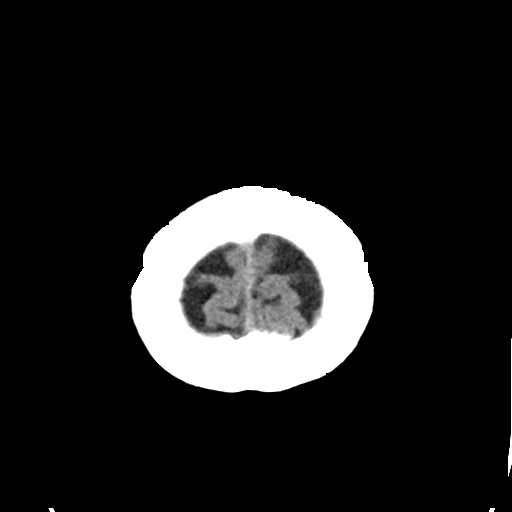
[im 27/29  brain]
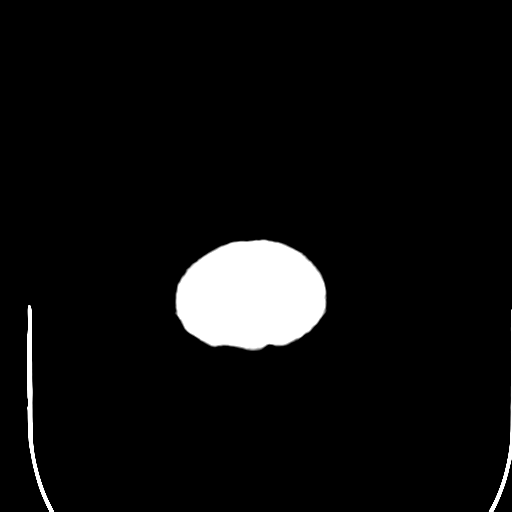
[im 27/29  bone]
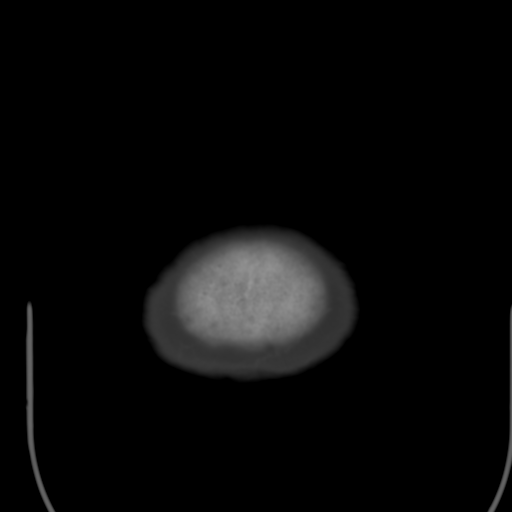

[Series 4: coronal soft tissue · coronal · 0.28mm/px · 3 of 64 slices shown]
[im 22/64  brain]
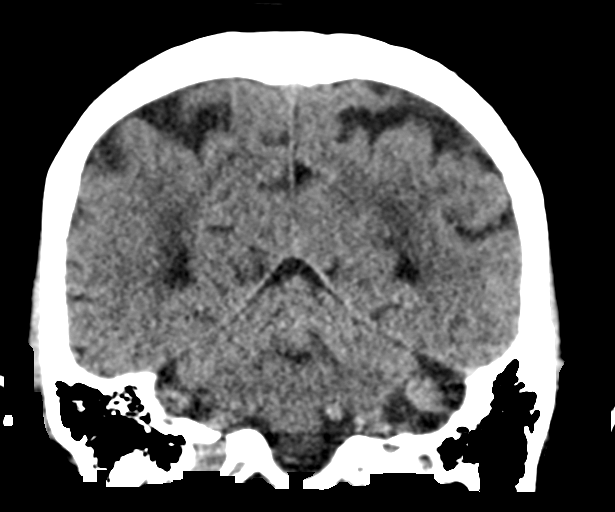
[im 29/64  brain]
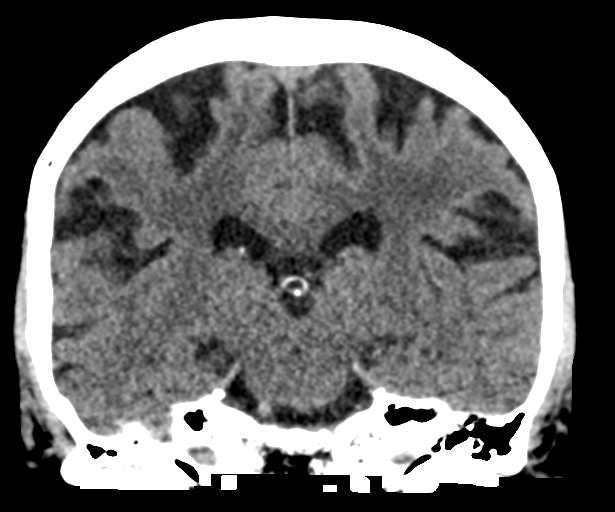
[im 36/64  brain]
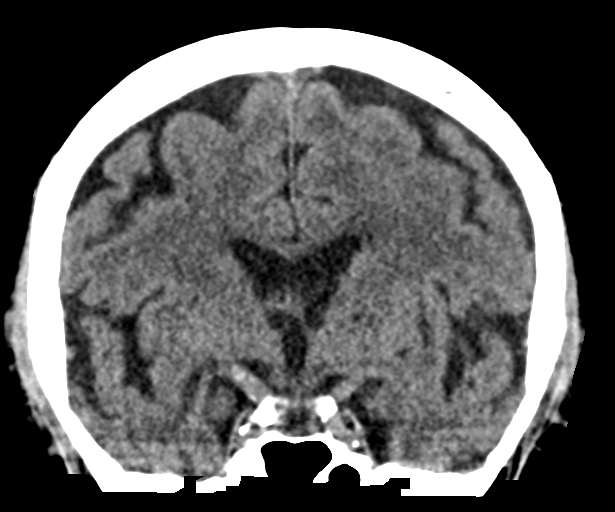

[Series 5: sagittal soft tissue · sagittal · 0.28mm/px · 3 of 58 slices shown]
[im 20/58  brain]
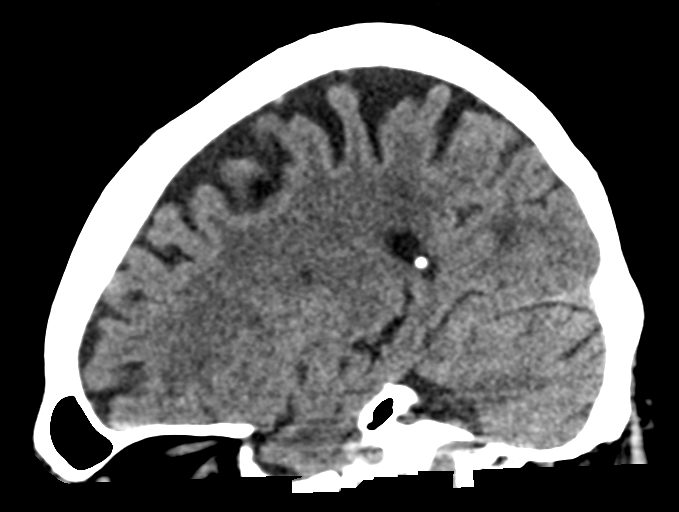
[im 29/58  brain]
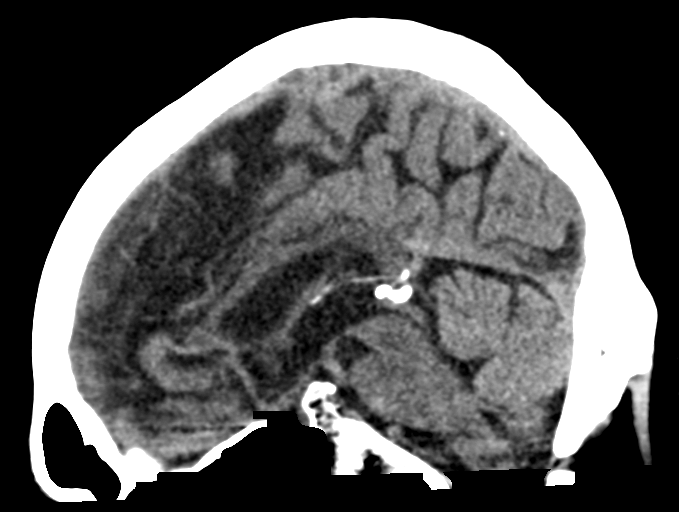
[im 39/58  brain]
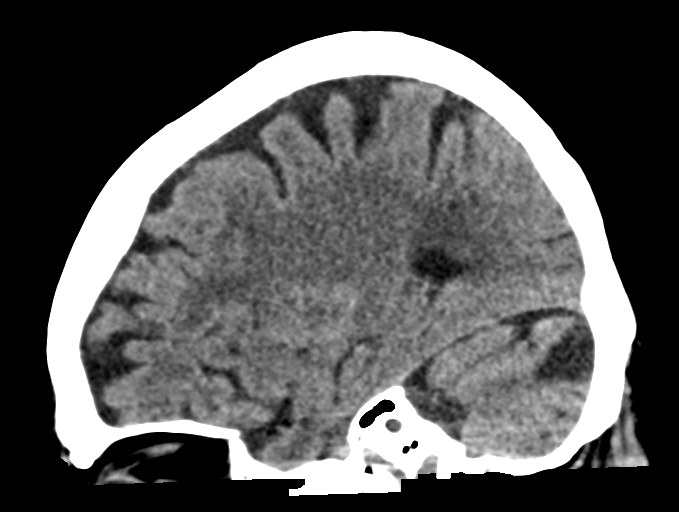

[15 of 46 positions shown; findings below may reference images not displayed]

FINDINGS: Brain: No evidence of acute infarction, hemorrhage, hydrocephalus,
extra-axial collection or mass lesion/mass effect.

Subcortical white matter and periventricular small vessel ischemic
changes. Old right thalamic and bilateral cerebellar infarcts.

Vascular: Intracranial atherosclerosis.

Skull: Normal. Negative for fracture or focal lesion.

Sinuses/Orbits: The visualized paranasal sinuses are essentially
clear. The mastoid air cells are unopacified.

Other: None.
IMPRESSION: No evidence of acute intracranial abnormality.

Old right thalamic and bilateral cerebellar infarcts. Small vessel
ischemic changes.

## 2019-06-08 IMAGING — MR MR HEAD W/O CM
9 of 10 series · 41 of 48 positions shown · non-contrast
Comparison: Head CT earlier today. Brain MRI [DATE].

CLINICAL DATA: 78-year-old male with stroke-like symptoms.

EXAM:
MRI HEAD WITHOUT CONTRAST
TECHNIQUE: Multiplanar, multiecho pulse sequences of the brain and surrounding
structures were obtained without intravenous contrast.

[Series 2: ax dwi_tracew · axial · 3.0mm · 0.71mm/px · z∈[-98,+66]mm · 8 of 112 slices shown]
[im 1/112]
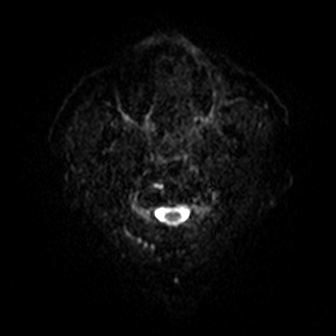
[im 23/112]
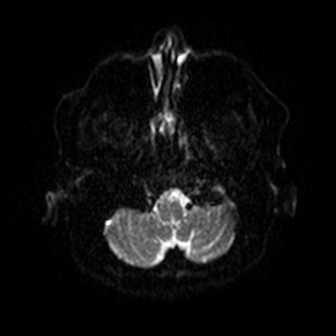
[im 34/112]
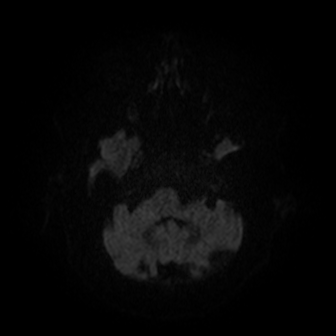
[im 45/112]
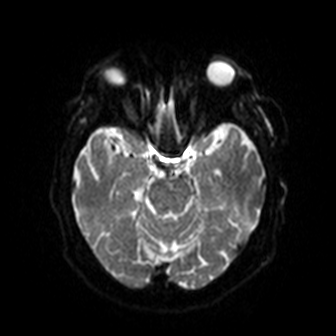
[im 67/112]
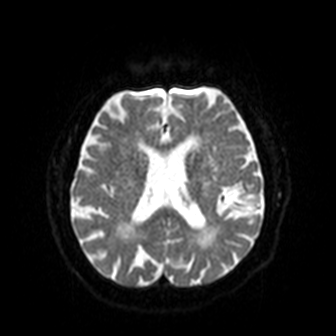
[im 78/112]
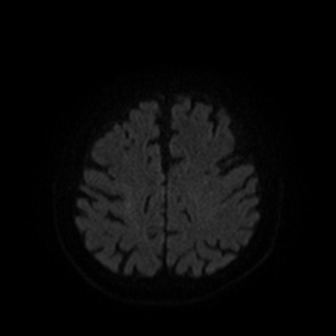
[im 89/112]
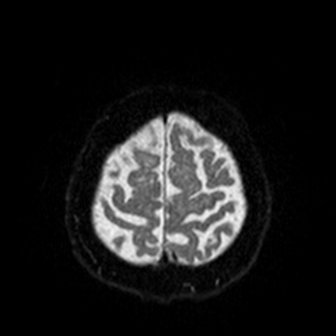
[im 112/112]
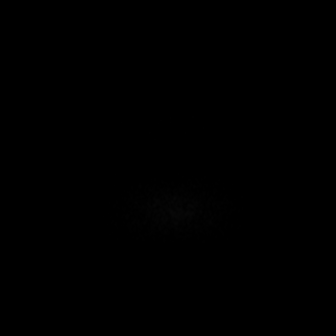

[Series 3: ax dwi_adc · axial · 3.0mm · 0.71mm/px · z∈[-98,+66]mm · 6 of 56 slices shown]
[im 1/56]
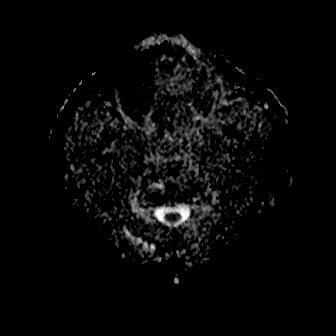
[im 12/56]
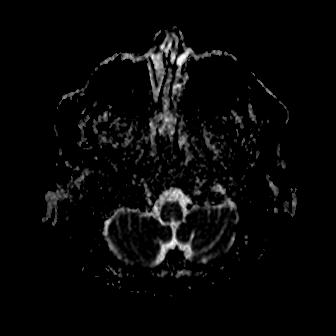
[im 23/56]
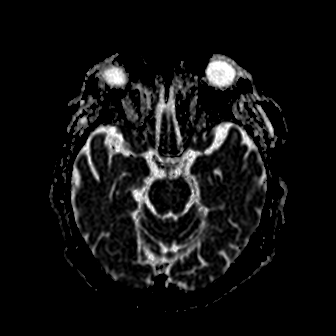
[im 34/56]
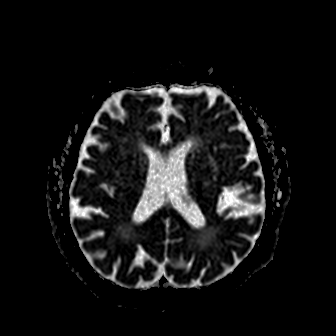
[im 45/56]
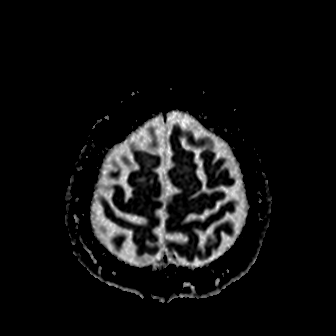
[im 56/56]
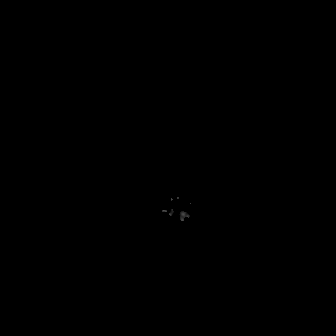

[Series 4: cor dwi_tracew · coronal · 5.0mm · 0.68mm/px · 8 of 80 slices shown]
[im 1/80]
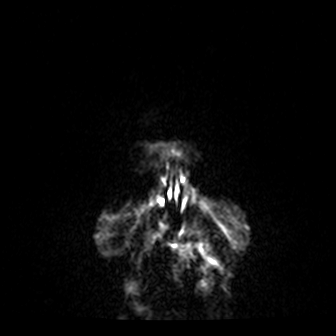
[im 10/80]
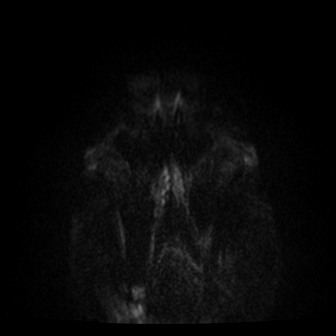
[im 20/80]
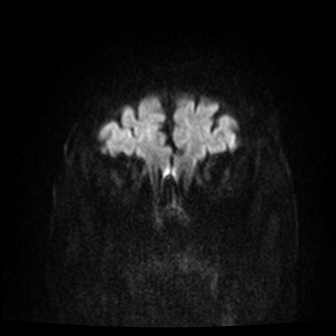
[im 30/80]
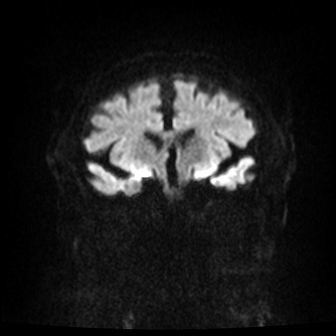
[im 50/80]
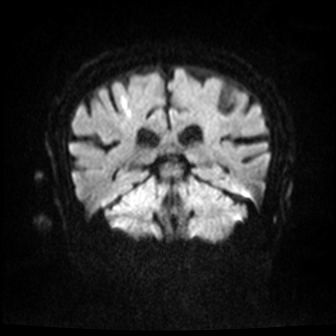
[im 60/80]
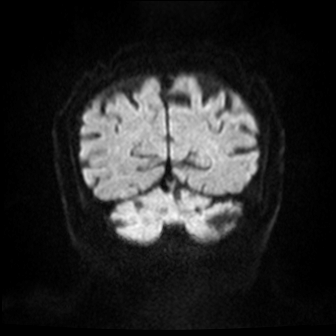
[im 70/80]
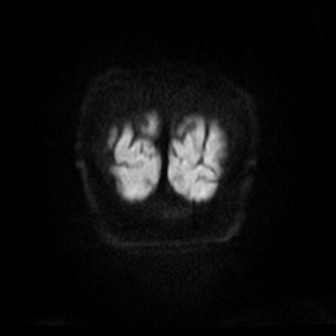
[im 80/80]
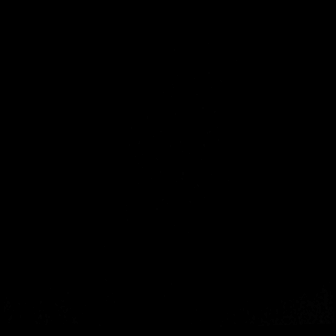

[Series 5: cor dwi_adc · coronal · 5.0mm · 0.68mm/px · 4 of 39 slices shown]
[im 1/39]
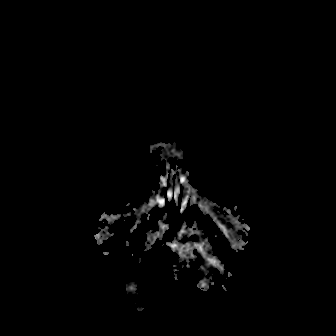
[im 13/39]
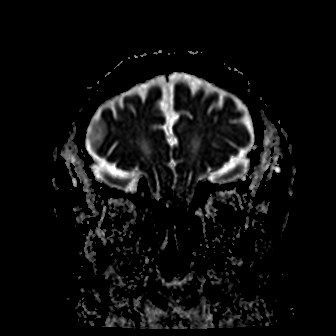
[im 26/39]
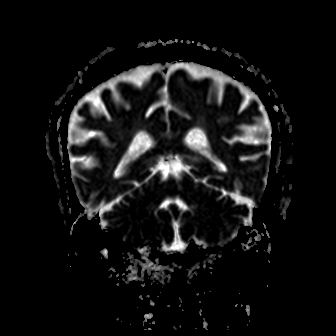
[im 39/39]
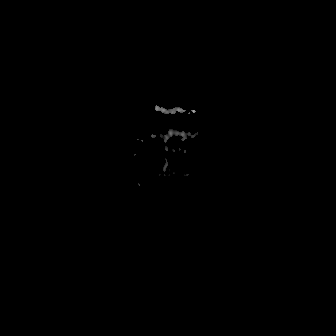

[Series 6: T1 · sagittal · 5.0mm · 0.94mm/px · 3 of 25 slices shown (1 of 2)]
[im 1/25]
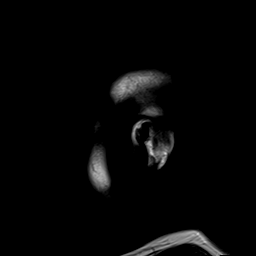
[im 13/25]
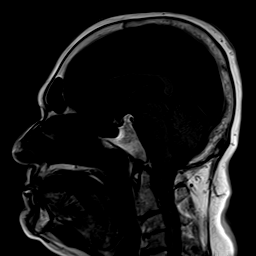
[im 25/25]
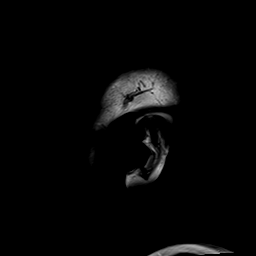

[Series 7: T2 · axial · 5.0mm · 0.45mm/px · z∈[-94,+61]mm · 3 of 27 slices shown (1 of 2)]
[im 1/27]
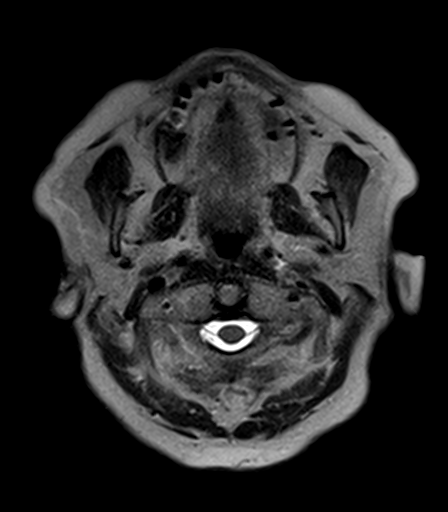
[im 14/27]
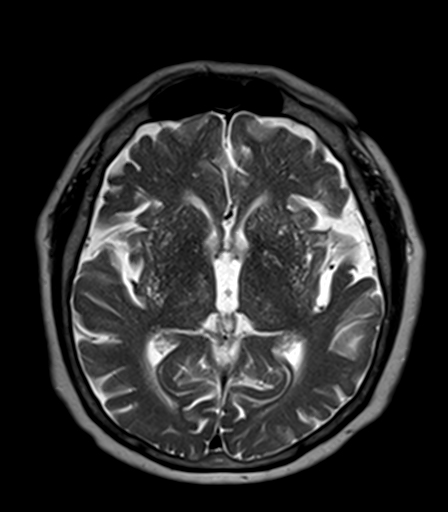
[im 27/27]
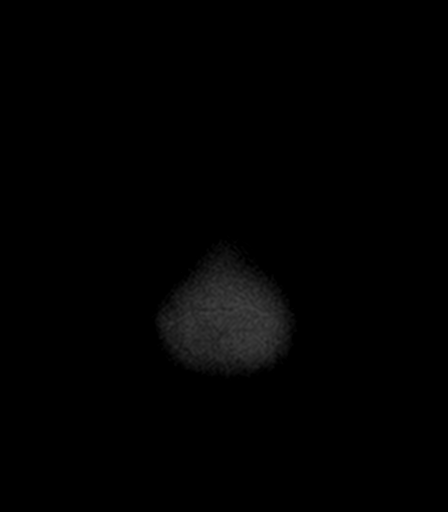

[Series 9: FLAIR · axial · 5.0mm · 1.20mm/px · z∈[-95,+60]mm · 3 of 27 slices shown]
[im 1/27]
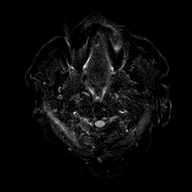
[im 14/27]
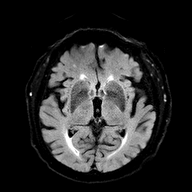
[im 27/27]
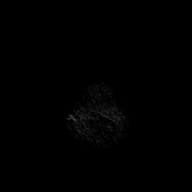

[Series 10: T1 · axial · 5.0mm · 0.90mm/px · z∈[-94,+61]mm · 3 of 27 slices shown (2 of 2)]
[im 1/27]
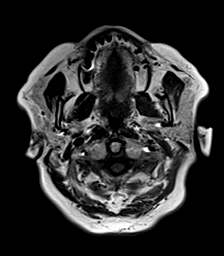
[im 14/27]
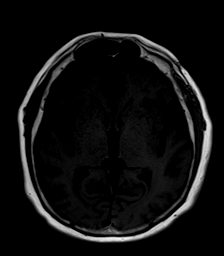
[im 27/27]
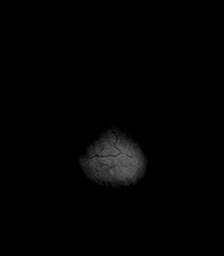

[Series 11: T2 · coronal · 5.0mm · 0.45mm/px · 3 of 31 slices shown (2 of 2)]
[im 1/31]
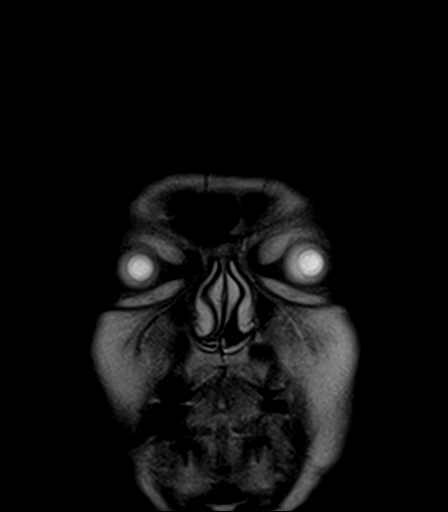
[im 16/31]
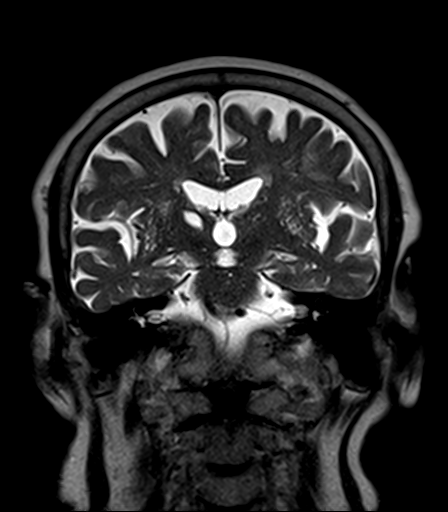
[im 31/31]
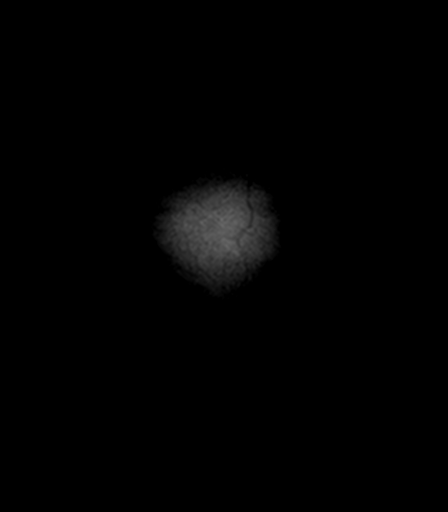

[41 of 48 positions shown; findings below may reference images not displayed]

FINDINGS: Brain: There is a small cortical and subcortical white matter linear
area of restricted diffusion at the right motor strip (series 2,
image 98 and series 4, image 56). Faint T2 and FLAIR hyperintensity.
No associated hemorrhage or mass effect.

No other restricted diffusion.

Numerous small chronic infarcts in both cerebellar hemispheres.
Stable prominent chronic lacunar infarct in the right thalamus.
Superimposed dilated perivascular spaces throughout the basal
ganglia. Scattered small cerebral white matter chronic lacunar
infarcts. Stable patchy T2 and FLAIR hyperintensity in the pons.
Occasional chronic micro hemorrhages were better demonstrated on the
prior MRI.

No midline shift, mass effect, evidence of mass lesion,
ventriculomegaly, extra-axial collection or acute intracranial
hemorrhage. Cervicomedullary junction and pituitary are within
normal limits.

Vascular: Major intracranial vascular flow voids are stable from
last year. Dominant left vertebral artery. Mild generalized
intracranial artery tortuosity.

Skull and upper cervical spine: Stable visible upper cervical spine
degeneration. Normal bone marrow signal.

Sinuses/Orbits: Stable and negative.

Other: Mastoids remain clear. Visible internal auditory structures
appear normal.
IMPRESSION: 1. Positive for a small acute lacunar infarct at the right motor
strip. No associated hemorrhage or mass effect.
2. Advanced underlying chronic small and medium-sized vessel
ischemia otherwise stable since last year.

## 2019-06-08 MED ORDER — ASPIRIN 300 MG RE SUPP
300.0000 mg | Freq: Every day | RECTAL | Status: DC
  Filled 2019-06-08 (×2): qty 1

## 2019-06-08 MED ORDER — SODIUM CHLORIDE 0.9% FLUSH
3.0000 mL | Freq: Once | INTRAVENOUS | Status: AC
Start: 2019-06-08 — End: 2019-06-08
  Administered 2019-06-08: 3 mL via INTRAVENOUS

## 2019-06-08 MED ORDER — ASPIRIN 325 MG PO TABS
325.0000 mg | ORAL_TABLET | Freq: Every day | ORAL | Status: DC
  Administered 2019-06-08 – 2019-06-10 (×3): 325 mg via ORAL
  Filled 2019-06-08 (×4): qty 1

## 2019-06-08 MED ORDER — ACETAMINOPHEN 160 MG/5ML PO SOLN
650.0000 mg | ORAL | Status: DC | PRN
  Filled 2019-06-08: qty 20.3

## 2019-06-08 MED ORDER — ACETAMINOPHEN 325 MG PO TABS: 650.0000 mg | ORAL_TABLET | ORAL | Status: DC | PRN

## 2019-06-08 MED ORDER — STROKE: EARLY STAGES OF RECOVERY BOOK
Freq: Once | Status: AC
  Administered 2019-06-08: 1

## 2019-06-08 MED ORDER — ENOXAPARIN SODIUM 40 MG/0.4ML ~~LOC~~ SOLN
40.0000 mg | SUBCUTANEOUS | Status: DC
  Administered 2019-06-09 – 2019-06-10 (×2): 40 mg via SUBCUTANEOUS
  Filled 2019-06-08 (×2): qty 0.4

## 2019-06-08 MED ORDER — SODIUM CHLORIDE 0.9 % IV SOLN: INTRAVENOUS | Status: DC

## 2019-06-08 MED ORDER — SENNOSIDES-DOCUSATE SODIUM 8.6-50 MG PO TABS: 1.0000 | ORAL_TABLET | Freq: Every evening | ORAL | Status: DC | PRN

## 2019-06-08 MED ORDER — LORAZEPAM 2 MG/ML IJ SOLN
1.0000 mg | Freq: Once | INTRAMUSCULAR | Status: AC | PRN
  Administered 2019-06-08: 1 mg via INTRAVENOUS
  Filled 2019-06-08: qty 1

## 2019-06-08 MED ORDER — ACETAMINOPHEN 650 MG RE SUPP: 650.0000 mg | RECTAL | Status: DC | PRN

## 2019-06-08 NOTE — ED Triage Notes (Signed)
Pt arrives to ED A&O, ambulatory. States he woke up at 7 and shortly after noticed R hand weakness. Grip strength equal. Pt states "I think I just slept wrong." states L arm was numb feeling earlier today. Worried that he can't straighten finger out. No swelling noted.   Had a stroke few months ago and had vision problems.   No speech changes, no vision changes. Grip strength equal, no arm or leg drift, no facial drift.

## 2019-06-08 NOTE — ED Notes (Addendum)
Pt with left arm weakness since this am per pt. Pt with decreased grip in left hand, no drift noted in left arm. Decreased sensation in left arm. Left ring finger is flexed, pt states it is difficult to straighten. Pt walked to bathroom, gait steady.Cameron Foster

## 2019-06-08 NOTE — ED Notes (Signed)
Pt to MRI

## 2019-06-08 NOTE — H&P (Signed)
History and Physical    [name] YOV:[id number] DOB: Dec 24, 1940 DOA: 06/08/2019  PCP: [name], MD   Patient coming from:home I have personally briefly reviewed patient's old medical records in [place]  Chief Complaint: right arm weeakness  HPI: Cameron Foster. is a 79 y.o. male with medical history significant for hypertension, paroxysmal atrial fibrillation not on anticoagulation, history of CVA, HLD, who presents to the emergency room at 2:40 PM after waking at 7 AM with left arm weakness and numbness with decreased grip strength. He  Also noted a problem extending his left ring finger felt like the finger was dropping forward when he tried to open his hand , it had never happened before. Denies prior injury. He denied facial numbness tingling or weakness or similar symptoms in the lower extremity and denies problems with ambulation.  Denies associated headache or visual disturbance, problems with speech or swallow.   ED Course: On arrival in the emergency room blood pressure was 131/89 with otherwise normal vitals.  Creatinine was 1.39 which is above his baseline of 1.02 in the past year.  Head CT showed an old right thalamic and bilateral cerebellar infarcts.  Follow-up MRI showed a small acute lacunar infarct the right motor strip.  TPA was not considered as patient presented outside of the TPA window.  Review of Systems: As per HPI otherwise 10 point review of systems negative.    Past Medical History:  Diagnosis Date  . Alcoholism ([place])   . Arthritis   . Atrial fibrillation ([place])    one episode  . Colon polyp   . Depression    Son died 07-07-2014  . Diverticulitis   . Diverticulosis 30 years  . Dysrhythmia    At Fib 1995  . GERD (gastroesophageal reflux disease)   . Hyperlipidemia   . Hypertension   . Irritable bowel syndrome   . Prostate cancer ([place]) 07-Jul-2010   treated with radiation therapy. Prostate  . Vasovagal syncope     Past Surgical  History:  Procedure Laterality Date  . [place] no stents  . CATARACT EXTRACTION, BILATERAL    . COLON RESECTION SIGMOID N/A 12/07/2016   Procedure: COLON RESECTION SIGMOID;  Surgeon: Clayburn Pert, MD;  Location: [facility] ORS;  Service: General;  Laterality: N/A;  . COLON SURGERY  11/2016   Colostomy  . COLONOSCOPY  07-07-2014  . COLONOSCOPY WITH PROPOFOL N/A 05/30/2016   Procedure: COLONOSCOPY WITH PROPOFOL;  Surgeon: Lollie Sails, MD;  [facility] END[facility]COPY;  Service: Endoscopy;  Laterality: N/A;  . COLOSTOMY Left 12/07/2016   Procedure: COLOSTOMY;  Surgeon: Clayburn Pert, MD;  Location: [facility] ORS;  Service: General;  Laterality: Left;  . COLOSTOMY REVERSAL N/A 03/21/2017   Procedure: COLOSTOMY REVERSAL;  Surgeon: Clayburn Pert, MD;  Location: [facility] ORS;  Service: General;  Laterality: N/A;  . COLOSTOMY TAKEDOWN N/A 03/21/2017   Procedure: LAPAROSCOPIC COLOSTOMY TAKEDOWN;  Surgeon: Clayburn Pert, MD;  Location: [facility] ORS;  Service: General;  Laterality: N/A;  . CYSTOSCOPY WITH STENT PLACEMENT Bilateral 03/21/2017   Procedure: CYSTOSCOPY WITH LIGHTED STENT PLACEMENT;  Surgeon: Abbie Sons, MD;  Location: [facility] ORS;  Service: Urology;  Laterality: Bilateral;  . ESOPHAGOGASTRODUODENOSCOPY    . ESOPHAGOGASTRODUODENOSCOPY N/A 05/30/2016   Procedure: ESOPHAGOGASTRODUODENOSCOPY (EGD);  Surgeon: Lollie Sails, MD;  Location: [facility] ENDOSCOPY;  Service: Endoscopy;  Laterality: N/A;  . EYE SURGERY     cataracts  . FLEXIBLE SIGMOIDOSCOPY N/A 03/21/2017  Procedure: FLEXIBLE SIGMOIDOSCOPY;  Surgeon: Clayburn Pert, MD;  Location: [facility] ORS;  Service: General;  Laterality: N/A;  . FRACTURE SURGERY Bilateral    right arm and left wrist  . INCISION AND DRAINAGE ABSCESS N/A 12/07/2016   Procedure: DRAINAGE  OF INTRA ABDOMINAL ABSCESS;  Surgeon: Clayburn Pert, MD;  Location: [facility] ORS;  Service: General;  Laterality: N/A;  . KNEE ARTHROSCOPY    .  LAPAROTOMY N/A 12/07/2016   Procedure: EXPLORATORY LAPAROTOMY;  Surgeon: Clayburn Pert, MD;  Location: [facility] ORS;  Service: General;  Laterality: N/A;  . PROSTATE SURGERY     Microwave therapy  . TONSILLECTOMY    . TOTAL KNEE ARTHROPLASTY Right 07/27/2016   Procedure: TOTAL KNEE ARTHROPLASTY;  Surgeon: [name], MD;  Location: [facility] ORS;  Service: Orthopedics;  Laterality: Right;  Dr. Erlene Quan had to place Urinary catheter due to prostate cancer history.  Using flexible scope.     reports that he quit smoking about 31 years ago. His smoking use included cigarettes. He has a 27.00 pack-year smoking history. He has quit using smokeless tobacco.  His smokeless tobacco use included chew. He reports current alcohol use of about 3.0 standard drinks of alcohol per week. He reports that he does not use drugs.  Allergies  Allergen Reactions  . Augmentin [Amoxicillin-Pot Clavulanate] Other (See Comments)    Abdominal upset Has patient had a PCN reaction causing immediate rash, facial/tongue/throat swelling, SOB or lightheadedness with hypotension: Unknown Has patient had a PCN reaction causing severe rash involving mucus membranes or skin necrosis: Unknown Has patient had a PCN reaction that required hospitalization: Unknown Has patient had a PCN reaction occurring within the last 10 years: Unknown If all of the above answers are "NO", then may proceed with Cephalosporin use.   . Formaldehyde Rash  . Tape Rash    Pt states he is not allergic to tape.    Family History  Problem Relation Age of Onset  . Lung cancer Father        smoker  . Other Mother   . Sudden death Son        due to "Blood clots"  . Bipolar disorder Son   . Kidney disease Daughter        congenital one small kidney  . Prostate cancer Neg Hx   . Bladder Cancer Neg Hx      Prior to Admission medications   Medication Sig Start Date End Date Taking? Authorizing Provider  amLODipine (NORVASC) 10 MG tablet TAKE 1  TABLET BY MOUTH EVERY DAY 04/25/19   [name], MD  aspirin 81 MG EC tablet Take by mouth.    [provider]  atorvastatin (LIPITOR) 20 MG tablet Take 1 tablet (20 mg total) by mouth daily at 6 PM. 05/15/19   Bacigalupo, Dionne Bucy, MD  docusate sodium (STOOL SOFTENER) 100 MG capsule Take 100 mg by mouth 2 (two) times daily.    [provider]  fluticasone (FLONASE) 50 MCG/ACT nasal spray Place 1 spray into both nostrils daily as needed for allergies or rhinitis. 05/15/19   Bacigalupo, Dionne Bucy, MD  furosemide (LASIX) 20 MG tablet Take 1 tablet (20 mg total) by mouth daily. 04/22/19 07/21/19  End, Harrell Gave, MD  lisinopril (ZESTRIL) 10 MG tablet TAKE 1 TABLET BY MOUTH EVERY DAY 01/07/19   Bacigalupo, Dionne Bucy, MD  meclizine (ANTIVERT) 25 MG tablet Take 25 mg by mouth 2 (two) times daily as needed. 05/03/19   [provider]  meloxicam (  MOBIC) 15 MG tablet Take 15 mg by mouth daily.  04/19/18   [provider]  metoprolol succinate (TOPROL-XL) 50 MG 24 hr tablet TAKE 1 TABLET (50 MG TOTAL) BY MOUTH DAILY. TAKE WITH OR IMMEDIATELY FOLLOWING A MEAL. 01/22/19   [name], MD  Multiple Vitamin (MULTIVITAMIN WITH MINERALS) TABS tablet Take 1 tablet by mouth daily. 10/24/18   Vaughan Basta, MD  omeprazole (PRILOSEC) 20 MG capsule TAKE 1 CAPSULE (20 MG TOTAL) BY MOUTH DAILY AS NEEDED (HEARTBURN). 04/22/19   [name], MD  sertraline (ZOLOFT) 100 MG tablet Take 1 tablet (100 mg total) by mouth daily. 05/30/18   [name], MD    Physical Exam: Vitals:   06/08/19 1442 06/08/19 1701 06/08/19 1749 06/08/19 1849  BP: 135/76 126/86 (!) 138/92 134/89  Pulse: 89 70 75 71  Resp: 16 16  16   Temp: 98.5 F (36.9 C)     TempSrc: Oral     SpO2: 97% 97% 95% 97%  Weight: 90.7 kg     Height: 5' 9"  (1.753 m)        Vitals:   06/08/19 1442 06/08/19 1701 06/08/19 1749 06/08/19 1849  BP: 135/76 126/86 (!) 138/92 134/89  Pulse: 89 70 75 71    Resp: 16 16  16   Temp: 98.5 F (36.9 C)     TempSrc: Oral     SpO2: 97% 97% 95% 97%  Weight: 90.7 kg     Height: 5' 9"  (1.753 m)       Constitutional: NAD, alert and oriented x 3 Eyes: PERRL, lids and conjunctivae normal ENMT: Mucous membranes are moist.  Neck: normal, supple, no masses, no thyromegaly Respiratory: clear to auscultation bilaterally, no wheezing, no crackles. Normal respiratory effort. No accessory muscle use.  Cardiovascular: Regular rate and rhythm, no murmurs / rubs / gallops. No extremity edema. 2+ pedal pulses. No carotid bruits.  Abdomen: no tenderness, no masses palpated. No hepatosplenomegaly. Bowel sounds positive.  Musculoskeletal: no clubbing / cyanosis. No joint deformity upper and lower extremities.  Skin: no rashes, lesions, ulcers.  Neurologic: No pronator drift. Decreased grip strength left hand. 4/5 left third and fourth fingers in brace from the emergency room Psychiatric: Normal mood and affect.   Labs on Admission: I have personally reviewed following labs and imaging studies  CBC: Recent Labs  Lab 06/08/19 1452  WBC 6.9  NEUTROABS 5.1  HGB 12.4*  HCT 39.0  MCV 91.1  PLT 295   Basic Metabolic Panel: Recent Labs  Lab 06/08/19 1452  NA 139  K 3.6  CL 106  CO2 23  GLUCOSE 167*  BUN 21  CREATININE 1.39*  CALCIUM 9.3   GFR: Estimated Creatinine Clearance: 48.8 mL/min (A) (by C-G formula based on SCr of 1.39 mg/dL (H)). Liver Function Tests: Recent Labs  Lab 06/08/19 1452  AST 27  ALT 15  ALKPHOS 75  BILITOT 0.8  PROT 7.2  ALBUMIN 3.9   No results for input(s): LIPASE, AMYLASE in the last 168 hours. No results for input(s): AMMONIA in the last 168 hours. Coagulation Profile: Recent Labs  Lab 06/08/19 1452  INR 1.0   Cardiac Enzymes: No results for input(s): CKTOTAL, CKMB, CKMBINDEX, TROPONINI in the last 168 hours. BNP (last 3 results) No results for input(s): PROBNP in the last 8760 hours. HbA1C: No results for  input(s): HGBA1C in the last 72 hours. CBG: No results for input(s): GLUCAP in the last 168 hours. Lipid Profile: No results for input(s):  CHOL, HDL, LDLCALC, TRIG, CHOLHDL, LDLDIRECT in the last 72 hours. Thyroid Function Tests: No results for input(s): TSH, T4TOTAL, FREET4, T3FREE, THYROIDAB in the last 72 hours. Anemia Panel: No results for input(s): VITAMINB12, FOLATE, FERRITIN, TIBC, IRON, RETICCTPCT in the last 72 hours. Urine analysis:    Component Value Date/Time   COLORURINE YELLOW (A) 06/08/2019 1738   APPEARANCEUR CLEAR (A) 06/08/2019 1738   APPEARANCEUR Clear 08/28/2017 0000   LABSPEC 1.015 06/08/2019 1738   PHURINE 5.0 06/08/2019 1738   GLUCOSEU NEGATIVE 06/08/2019 1738   HGBUR NEGATIVE 06/08/2019 1738   BILIRUBINUR NEGATIVE 06/08/2019 1738   BILIRUBINUR Negative 08/28/2017 0000   KETONESUR NEGATIVE 06/08/2019 1738   PROTEINUR NEGATIVE 06/08/2019 1738   UROBILINOGEN 0.2 04/19/2017 1559   NITRITE NEGATIVE 06/08/2019 1738   LEUKOCYTESUR NEGATIVE 06/08/2019 1738    Radiological Exams on Admission: CT HEAD WO CONTRAST  Result Date: 06/08/2019 CLINICAL DATA:  TIA, possible stroke EXAM: CT HEAD WITHOUT CONTRAST TECHNIQUE: Contiguous axial images were obtained from the base of the skull through the vertex without intravenous contrast. COMPARISON:  CT head dated 10/20/2018. MRI brain dated 10/21/2018. FINDINGS: Brain: No evidence of acute infarction, hemorrhage, hydrocephalus, extra-axial collection or mass lesion/mass effect. Subcortical Reeder matter and periventricular small vessel ischemic changes. 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 Vascular: Intracranial atherosclerosis. Skull: Normal. Negative for fracture or focal lesion. Sinuses/Orbits: The visualized paranasal sinuses are essentially clear. The mastoid air cells are unopacified. Other: None. IMPRESSION: No evidence of acute intracranial abnormality. 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  Small vessel ischemic changes. Electronically Signed   By: Julian Hy M.D.   On: 06/08/2019 15:18   MR Brain Wo Contrast (neuro protocol)  Result Date: 06/08/2019 CLINICAL DATA:  79 year old male with stroke-like symptoms. EXAM: MRI HEAD WITHOUT CONTRAST TECHNIQUE: Multiplanar, multiecho pulse sequences of the brain and surrounding structures were obtained without intravenous contrast. COMPARISON:  Head CT earlier today. Brain MRI 10/21/2018. FINDINGS: Brain: There is a small cortical and subcortical Herandez matter linear area of restricted diffusion at the right motor strip (series 2, image 98 and series 4, image 56). Faint T2 and FLAIR hyperintensity. No associated hemorrhage or mass effect. No other restricted diffusion. Numerous small chronic infarcts in both cerebellar hemispheres. Stable prominent chronic lacunar infarct in the right thalamus. Superimposed dilated perivascular spaces throughout the basal ganglia. Scattered small cerebral Dalporto matter chronic lacunar infarcts. Stable patchy T2 and FLAIR hyperintensity in the pons. Occasional chronic micro hemorrhages were better demonstrated on the prior MRI. No midline shift, mass effect, evidence of mass lesion, ventriculomegaly, extra-axial collection or acute intracranial hemorrhage. Cervicomedullary junction and pituitary are within normal limits. Vascular: Major intracranial vascular flow voids are stable from last year. Dominant left vertebral artery. Mild generalized intracranial artery tortuosity. Skull and upper cervical spine: Stable visible upper cervical spine degeneration. Normal bone marrow signal. Sinuses/Orbits: Stable and negative. Other: Mastoids remain clear. Visible internal auditory structures appear normal. IMPRESSION: 1. Positive for a small acute lacunar infarct at the right motor strip. No associated hemorrhage or mass effect. 2. Advanced underlying chronic small and medium-sized vessel ischemia otherwise stable since last  year. Electronically Signed   By: Genevie Ann M.D.   On: 06/08/2019 20:30    EKG: Independently reviewed.   Assessment/Plan Principal Problem:   Acute CVA (cerebrovascular accident) ([place]) -Patient with decreased grip strength left hand otherwise no apparent neurologic deficit -MRI showed small acute lacunar infarct -Aspirin 325 mg x 1 -Patient was previously on baby  aspirin so might benefit from addition of Plavix for a month.  Intensify home statin -Stroke work-up to include continuous cardiac monitoring for arrhythmias, echocardiogram and carotid Doppler -Allow permissive hypertension for 24 hours.  Was normotensive on admission so we will hold home antihypertensives for now -Speech physical and Occupational Therapy    Paroxysmal atrial fibrillation ([place]) -Has documentation of paroxysmal A. fib.  Patient is currently on aspirin but not on systemic anticoagulation for stroke prevention -Continue metoprolol    Essential hypertension -Holding home antihypertensives to allow permissive hypertension    Mixed hyperlipidemia -Follow-up lipid profile -For intensification of home statin once medication reconciliation  Left fourth finger extensor problem/drop , possible wrist drop -Uncertain whether related to CVA and may be peripheral neuropathy --continue with finger brace from the ER -outpatient Referral to hand surgeon can be considered   DVT prophylaxis: lovenox  Code Status: full code  Family Communication: none  Disposition Plan: Back to previous home environment Consults called: neurology, Dr. Viviano Simas MD [facility]     06/08/2019, 9:35 PM

## 2019-06-08 NOTE — ED Notes (Signed)
Pt may eat and drink after passing stroke swallow screen per EDP. Pt passed screen, pt given sandwich tray and soda. Pt tolerating well.

## 2019-06-08 NOTE — ED Provider Notes (Signed)
[facility] Emergency Department Provider Note  ____________________________________________   First MD Initiated Contact with Patient 06/08/19 1653     (approximate)  I have reviewed the triage vital signs and the nursing notes.  History  Chief Complaint Weakness    HPI Cameron Foster. is a 79 y.o. male past medical history as below, including prior CVA who presents to the emergency department for left hand and left upper extremity weakness.  Patient states he woke up this morning around 7 AM and did not notice any issues.  When he started to eat breakfast at 8 AM he noticed his left hand was weak when trying to eat.  Thereafter he felt some numbness of the left upper extremity, which has since resolved.  He feels like his weakness is improving, but still present. No associated visual changes, speech difficulties, facial droop, or other extremity symptoms. He also noted at the same time that he has had some weakness with straightening his left ring finger completely.  First noticed this today as well.  He denies any preceding injuries or known trauma.  No swelling, redness, warmth.  No recent infection.  Denies any prior issues with this finger.     Past Medical Hx Past Medical History:  Diagnosis Date  . Alcoholism ([place])   . Arthritis   . Atrial fibrillation ([place])    one episode  . Colon polyp   . Depression    Son died June 24, 2014  . Diverticulitis   . Diverticulosis 30 years  . Dysrhythmia    At Fib 1995  . GERD (gastroesophageal reflux disease)   . Hyperlipidemia   . Hypertension   . Irritable bowel syndrome   . Prostate cancer ([place]) 24-Jun-2010   treated with radiation therapy. Prostate  . Vasovagal syncope     Problem List Patient Active Problem List   Diagnosis Date Noted  . Diplopia 11/07/2018  . Paroxysmal atrial fibrillation ([place]) 11/07/2018  . TIA (transient ischemic attack) 10/20/2018  . SAH (subarachnoid hemorrhage) ([place]) 10/16/2018  .  Recurrent sinusitis 10/16/2018  . Low libido 08/30/2018  . Obesity 05/14/2018  . Mixed hyperlipidemia 05/14/2018  . Degeneration of lumbar intervertebral disc 03/22/2018  . Osteoarthritis of hip 03/22/2018  . Trochanteric bursitis of right hip 03/22/2018  . Status post partial colectomy 01/03/2017  . Depression, major, single episode, mild ([place]) 01/03/2017  . Vasovagal syncope 01/27/2016  . History of skin cancer 10/28/2015  . Osteoarthritis of right knee 10/01/2015  . Essential hypertension 08/25/2015  . Dyspnea on exertion 08/12/2015  . Erectile dysfunction following radiation therapy 08/04/2015  . Constipation 08/02/2015  . History of prostate cancer 01/20/2015  . Swelling of right lower extremity 01/20/2015    Past Surgical Hx Past Surgical History:  Procedure Laterality Date  . [place] no stents  . CATARACT EXTRACTION, BILATERAL    . COLON RESECTION SIGMOID N/A 12/07/2016   Procedure: COLON RESECTION SIGMOID;  Surgeon: Clayburn Pert, MD;  Location: [facility] ORS;  Service: General;  Laterality: N/A;  . COLON SURGERY  11/2016   Colostomy  . COLONOSCOPY  Jun 24, 2014  . COLONOSCOPY WITH PROPOFOL N/A 05/30/2016   Procedure: COLONOSCOPY WITH PROPOFOL;  Surgeon: Lollie Sails, MD;  Location: [facility] ENDOSCOPY;  Service: Endoscopy;  Laterality: N/A;  . COLOSTOMY Left 12/07/2016   Procedure: COLOSTOMY;  Surgeon: Clayburn Pert, MD;  Location: [facility] ORS;  Service: General;  Laterality: Left;  . COLOSTOMY REVERSAL N/A 03/21/2017   Procedure:  COLOSTOMY REVERSAL;  Surgeon: Clayburn Pert, MD;  Location: [facility] ORS;  Service: General;  Laterality: N/A;  . COLOSTOMY TAKEDOWN N/A 03/21/2017   Procedure: LAPAROSCOPIC COLOSTOMY TAKEDOWN;  Surgeon: Clayburn Pert, MD;  Location: [facility] ORS;  Service: General;  Laterality: N/A;  . CYSTOSCOPY WITH STENT PLACEMENT Bilateral 03/21/2017   Procedure: CYSTOSCOPY WITH LIGHTED STENT PLACEMENT;  Surgeon: Abbie Sons,  MD;  Location: [facility] ORS;  Service: Urology;  Laterality: Bilateral;  . ESOPHAGOGASTRODUODENOSCOPY    . ESOPHAGOGASTRODUODENOSCOPY N/A 05/30/2016   Procedure: ESOPHAGOGASTRODUODENOSCOPY (EGD);  Surgeon: Lollie Sails, MD;  Location: [facility] ENDOSCOPY;  Service: Endoscopy;  Laterality: N/A;  . EYE SURGERY     cataracts  . FLEXIBLE SIGMOIDOSCOPY N/A 03/21/2017   Procedure: FLEXIBLE SIGMOIDOSCOPY;  Surgeon: Clayburn Pert, MD;  Location: [facility] ORS;  Service: General;  Laterality: N/A;  . FRACTURE SURGERY Bilateral    right arm and left wrist  . INCISION AND DRAINAGE ABSCESS N/A 12/07/2016   Procedure: DRAINAGE  OF INTRA ABDOMINAL ABSCESS;  Surgeon: Clayburn Pert, MD;  Location: [facility] ORS;  Service: General;  Laterality: N/A;  . KNEE ARTHROSCOPY    . LAPAROTOMY N/A 12/07/2016   Procedure: EXPLORATORY LAPAROTOMY;  Surgeon: Clayburn Pert, MD;  Location: [facility] ORS;  Service: General;  Laterality: N/A;  . PROSTATE SURGERY     Microwave therapy  . TONSILLECTOMY    . TOTAL KNEE ARTHROPLASTY Right 07/27/2016   Procedure: TOTAL KNEE ARTHROPLASTY;  Surgeon: [name], MD;  Location: [facility] ORS;  Service: Orthopedics;  Laterality: Right;  Dr. Erlene Quan had to place Urinary catheter due to prostate cancer history.  Using flexible scope.    Medications Prior to Admission medications   Medication Sig Start Date End Date Taking? Authorizing Provider  amLODipine (NORVASC) 10 MG tablet TAKE 1 TABLET BY MOUTH EVERY DAY 04/25/19   [name], MD  aspirin 81 MG EC tablet Take by mouth.    [provider]  atorvastatin (LIPITOR) 20 MG tablet Take 1 tablet (20 mg total) by mouth daily at 6 PM. 05/15/19   Bacigalupo, Dionne Bucy, MD  docusate sodium (STOOL SOFTENER) 100 MG capsule Take 100 mg by mouth 2 (two) times daily.    [provider]  fluticasone (FLONASE) 50 MCG/ACT nasal spray Place 1 spray into both nostrils daily as needed for allergies or rhinitis. 05/15/19   Bacigalupo, Dionne Bucy,  MD  furosemide (LASIX) 20 MG tablet Take 1 tablet (20 mg total) by mouth daily. 04/22/19 07/21/19  End, Harrell Gave, MD  lisinopril (ZESTRIL) 10 MG tablet TAKE 1 TABLET BY MOUTH EVERY DAY 01/07/19   Bacigalupo, Dionne Bucy, MD  meclizine (ANTIVERT) 25 MG tablet Take 25 mg by mouth 2 (two) times daily as needed. 05/03/19   [provider]  meloxicam (MOBIC) 15 MG tablet Take 15 mg by mouth daily.  04/19/18   [provider]  metoprolol succinate (TOPROL-XL) 50 MG 24 hr tablet TAKE 1 TABLET (50 MG TOTAL) BY MOUTH DAILY. TAKE WITH OR IMMEDIATELY FOLLOWING A MEAL. 01/22/19   [name], MD  Multiple Vitamin (MULTIVITAMIN WITH MINERALS) TABS tablet Take 1 tablet by mouth daily. 10/24/18   Vaughan Basta, MD  omeprazole (PRILOSEC) 20 MG capsule TAKE 1 CAPSULE (20 MG TOTAL) BY MOUTH DAILY AS NEEDED (HEARTBURN). 04/22/19   [name], MD  sertraline (ZOLOFT) 100 MG tablet Take 1 tablet (100 mg total) by mouth daily. 05/30/18   [name], MD    Allergies Augmentin [amoxicillin-pot clavulanate], Formaldehyde, and  Tape  Family Hx Family History  Problem Relation Age of Onset  . Lung cancer Father        smoker  . Other Mother   . Sudden death Son        due to "Blood clots"  . Bipolar disorder Son   . Kidney disease Daughter        congenital one small kidney  . Prostate cancer Neg Hx   . Bladder Cancer Neg Hx     Social Hx Social History   Tobacco Use  . Smoking status: Former Smoker    Packs/day: 1.00    Years: 27.00    Pack years: 27.00    Types: Cigarettes    Quit date: 04/18/1988    Years since quitting: 31.1  . Smokeless tobacco: Former Systems developer    Types: Chew  Substance Use Topics  . Alcohol use: Yes    Alcohol/week: 3.0 standard drinks    Types: 3 Standard drinks or equivalent per week    Comment: Occasional- Former Heavy ETOH Use  . Drug use: No     Review of Systems  Constitutional: Negative for fever, chills. Eyes: Negative for  visual changes. ENT: Negative for sore throat. Cardiovascular: Negative for chest pain. Respiratory: Negative for shortness of breath. Gastrointestinal: Negative for nausea, vomiting.  Genitourinary: Negative for dysuria. Musculoskeletal: Negative for leg swelling. + LEFT ring finger weakness of extension Skin: Negative for rash. Neurological: Negative for headaches. + LEFT hand/LUE weakness   Physical Exam  Vital Signs: ED Triage Vitals [06/08/19 1442]  Enc Vitals Group     BP 135/76     Pulse Rate 89     Resp 16     Temp 98.5 F (36.9 C)     Temp Source Oral     SpO2 97 %     Weight 200 lb (90.7 kg)     Height 5' 9"  (1.753 m)     Head Circumference      Peak Flow      Pain Score 0     Pain Loc      Pain Edu?      Excl. in [place]?     Constitutional: Alert and oriented.  Head: Normocephalic. Atraumatic. Eyes: Conjunctivae clear. Sclera anicteric. Nose: No congestion. No rhinorrhea. Mouth/Throat: Wearing mask.  Neck: No stridor.   Cardiovascular: Normal rate, regular rhythm. Extremities well perfused. Respiratory: Normal respiratory effort.  Lungs CTAB. Gastrointestinal: Soft. Non-tender. Non-distended.  Musculoskeletal: LEFT hand: Motor, sensation in tact in radial, ulnar, median distribution. Able to flex and extend thru MCP, PIP, and DIP joints, however extension of the 4th digit is weak compared to remainder of digits. Held in slight flexion due to weakness. No swelling, erythema, warmth, fusiform swelling, or tenderness to the flexor and extensor tendon sheath.  No obvious deformities. Neurologic:  Normal speech and language.  No dysarthria or aphasia. Alert and oriented.  Face symmetric.  Tongue midline.  Cranial nerves II through XII intact. L grip strength, LUE strength 4/5 vs R grip strength 5/5. LE strength 5/5 and symmetric. UE and LE SILT.  Skin: Skin is warm, dry and intact. No rash noted. Psychiatric: Mood and affect are appropriate for  situation.  EKG  Personally reviewed.   Rate: 84 Rhythm: sinus Axis: LAD Intervals: WNL Nonspecific ST/T wave findings 1, aVL, V2 No STEMI    Radiology  CT head: IMPRESSION:  No evidence of acute intracranial abnormality.  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 Small  vessel ischemic changes.   MRI: IMPRESSION:  1. Positive for a small acute lacunar infarct at the right motor  strip. No associated hemorrhage or mass effect.  2. Advanced underlying chronic small and medium-sized vessel  ischemia otherwise stable since last year.    Procedures  Procedure(s) performed (including critical care):  Procedures   Initial Impression / Assessment and Plan / ED Course  79 y.o. male who presents to the ED for L grip weakness, LUE weakness. Started 8 AM today. Also noticed isolated extensor weakness of the left ring finger.  Ddx: TIA, CVA.   Regarding his ring finger, no evidence of flexor tenosynovitis or other acute infection by exam.  He is able to extend the finger without any popping, clicking, or pain therefore doubt trigger finger. Consider early Dupuytren's contracture, will plan to splint in extension and follow up with Hand.   CT ordered from triage negative for any acute intracranial abnormalities, old CVAs noted.  Will obtain MRI.  Given symptoms first noted at 8 AM, patient is not a candidate for TPA due to elapsed time from symptom onset.  MRI positive for acute infarct. Updated patient and wife on results and need for admission, they are agreeable. Discussed w/ hospitalist for admission.   Final Clinical Impression(s) / ED Diagnosis  Final diagnoses:  Left hand weakness  Dupuytren's contracture  Cerebrovascular accident (CVA), unspecified mechanism ([place])       Note:  This document was prepared using Dragon voice recognition software and may include unintentional dictation errors.   Lilia Pro., MD 06/09/19 [id number]

## 2019-06-09 ENCOUNTER — Observation Stay
Admit: 2019-06-09 | Discharge: 2019-06-09 | Disposition: A | Discharge status: Home / Self Care | Payer: Medicare Other | Attending: Internal Medicine | Admitting: Internal Medicine

## 2019-06-09 ENCOUNTER — Observation Stay: Payer: Medicare Other

## 2019-06-09 DIAGNOSIS — Z8673 Personal history of transient ischemic attack (TIA), and cerebral infarction without residual deficits: Secondary | ICD-10-CM | POA: Diagnosis not present

## 2019-06-09 DIAGNOSIS — I6389 Other cerebral infarction: Secondary | ICD-10-CM | POA: Diagnosis not present

## 2019-06-09 DIAGNOSIS — Z7982 Long term (current) use of aspirin: Secondary | ICD-10-CM | POA: Diagnosis not present

## 2019-06-09 DIAGNOSIS — I480 Paroxysmal atrial fibrillation: Secondary | ICD-10-CM | POA: Diagnosis present

## 2019-06-09 DIAGNOSIS — Z801 Family history of malignant neoplasm of trachea, bronchus and lung: Secondary | ICD-10-CM | POA: Diagnosis not present

## 2019-06-09 DIAGNOSIS — G629 Polyneuropathy, unspecified: Secondary | ICD-10-CM | POA: Diagnosis present

## 2019-06-09 DIAGNOSIS — Z87891 Personal history of nicotine dependence: Secondary | ICD-10-CM | POA: Diagnosis not present

## 2019-06-09 DIAGNOSIS — Z86010 Personal history of colonic polyps: Secondary | ICD-10-CM | POA: Diagnosis not present

## 2019-06-09 DIAGNOSIS — I10 Essential (primary) hypertension: Secondary | ICD-10-CM | POA: Diagnosis present

## 2019-06-09 DIAGNOSIS — I4891 Unspecified atrial fibrillation: Secondary | ICD-10-CM | POA: Diagnosis not present

## 2019-06-09 DIAGNOSIS — M21332 Wrist drop, left wrist: Secondary | ICD-10-CM | POA: Diagnosis present

## 2019-06-09 DIAGNOSIS — R29700 NIHSS score 0: Secondary | ICD-10-CM | POA: Diagnosis present

## 2019-06-09 DIAGNOSIS — I6381 Other cerebral infarction due to occlusion or stenosis of small artery: Secondary | ICD-10-CM | POA: Diagnosis present

## 2019-06-09 DIAGNOSIS — Z8546 Personal history of malignant neoplasm of prostate: Secondary | ICD-10-CM | POA: Diagnosis not present

## 2019-06-09 DIAGNOSIS — I6523 Occlusion and stenosis of bilateral carotid arteries: Secondary | ICD-10-CM

## 2019-06-09 DIAGNOSIS — F329 Major depressive disorder, single episode, unspecified: Secondary | ICD-10-CM | POA: Diagnosis present

## 2019-06-09 DIAGNOSIS — Z923 Personal history of irradiation: Secondary | ICD-10-CM | POA: Diagnosis not present

## 2019-06-09 DIAGNOSIS — Z96651 Presence of right artificial knee joint: Secondary | ICD-10-CM | POA: Diagnosis present

## 2019-06-09 DIAGNOSIS — Z9842 Cataract extraction status, left eye: Secondary | ICD-10-CM | POA: Diagnosis not present

## 2019-06-09 DIAGNOSIS — E782 Mixed hyperlipidemia: Secondary | ICD-10-CM | POA: Diagnosis present

## 2019-06-09 DIAGNOSIS — Z791 Long term (current) use of non-steroidal anti-inflammatories (NSAID): Secondary | ICD-10-CM | POA: Diagnosis not present

## 2019-06-09 DIAGNOSIS — Z20822 Contact with and (suspected) exposure to covid-19: Secondary | ICD-10-CM | POA: Diagnosis present

## 2019-06-09 DIAGNOSIS — I639 Cerebral infarction, unspecified: Secondary | ICD-10-CM | POA: Diagnosis not present

## 2019-06-09 DIAGNOSIS — Z9841 Cataract extraction status, right eye: Secondary | ICD-10-CM | POA: Diagnosis not present

## 2019-06-09 DIAGNOSIS — Z880 Allergy status to penicillin: Secondary | ICD-10-CM | POA: Diagnosis not present

## 2019-06-09 DIAGNOSIS — K219 Gastro-esophageal reflux disease without esophagitis: Secondary | ICD-10-CM | POA: Diagnosis present

## 2019-06-09 DIAGNOSIS — Z888 Allergy status to other drugs, medicaments and biological substances status: Secondary | ICD-10-CM | POA: Diagnosis not present

## 2019-06-09 DIAGNOSIS — M720 Palmar fascial fibromatosis [Dupuytren]: Secondary | ICD-10-CM | POA: Diagnosis not present

## 2019-06-09 HISTORY — DX: Occlusion and stenosis of bilateral carotid arteries: I65.23

## 2019-06-09 LAB — SARS CORONAVIRUS 2 (TAT 6-24 HRS): SARS Coronavirus 2: NEGATIVE

## 2019-06-09 LAB — ECHOCARDIOGRAM COMPLETE
Height: 68 in
Weight: 3505.6 oz

## 2019-06-09 LAB — HEMOGLOBIN A1C
Hgb A1c MFr Bld: 5.6 % (ref 4.8–5.6)
Mean Plasma Glucose: 114.02 mg/dL

## 2019-06-09 LAB — LIPID PANEL
Cholesterol: 158 mg/dL (ref 0–200)
HDL: 36 mg/dL — ABNORMAL LOW (ref >40)
LDL Cholesterol: 89 mg/dL (ref 0–99)
Total CHOL/HDL Ratio: 4.4 RATIO
Triglycerides: 165 mg/dL — ABNORMAL HIGH (ref <150)
VLDL: 33 mg/dL (ref 0–40)

## 2019-06-09 IMAGING — US US CAROTID DUPLEX BILAT
1 series · 13 of 24 positions shown · non-contrast
Comparison: [DATE]

CLINICAL DATA: Stroke symptoms, hypertension, hyperlipidemia

EXAM:
BILATERAL CAROTID DUPLEX ULTRASOUND
TECHNIQUE: Gray scale imaging, color Doppler and duplex ultrasound were
performed of bilateral carotid and vertebral arteries in the neck.

[Series 2: us carotid duplex bilat · 13 of 67 slices shown]
[im 1/67]
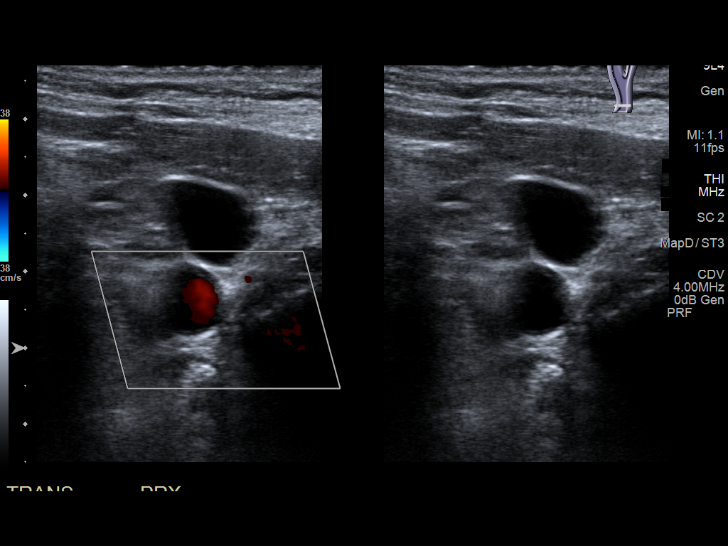
[im 6/67]
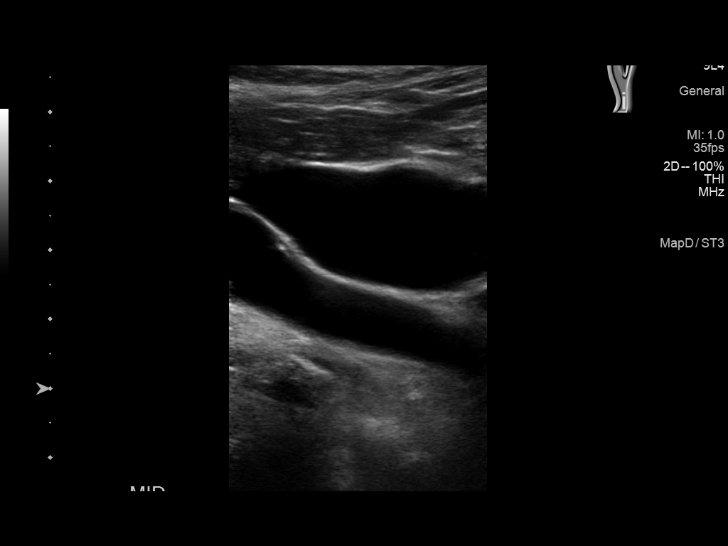
[im 12/67]
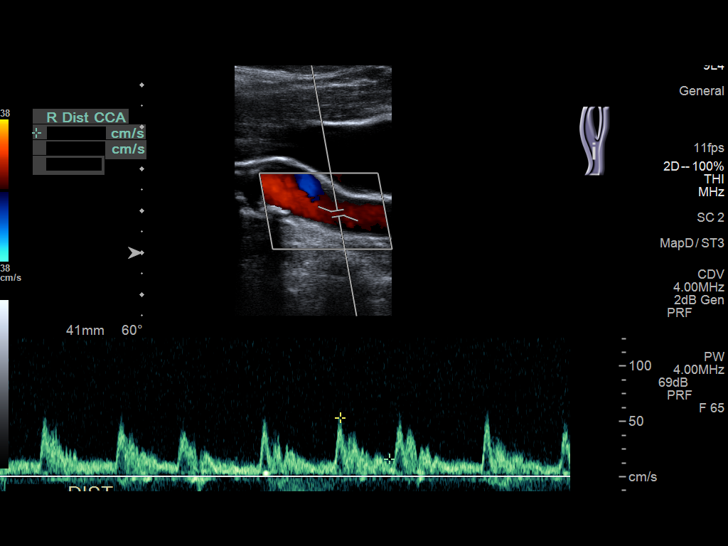
[im 18/67]
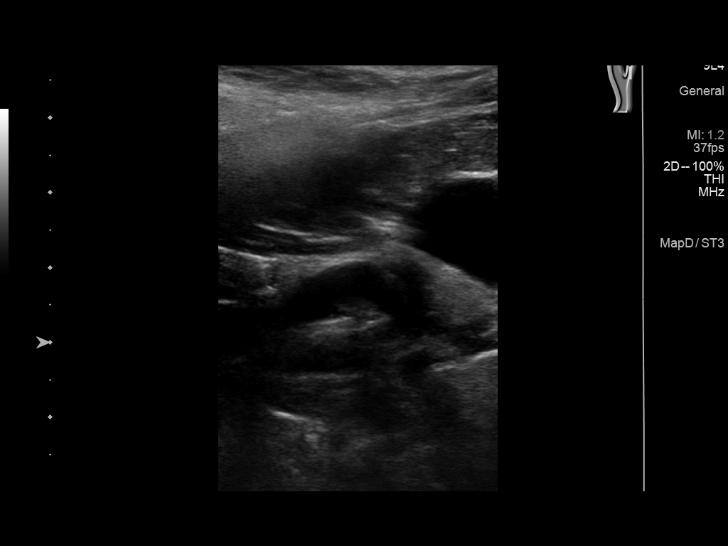
[im 23/67]
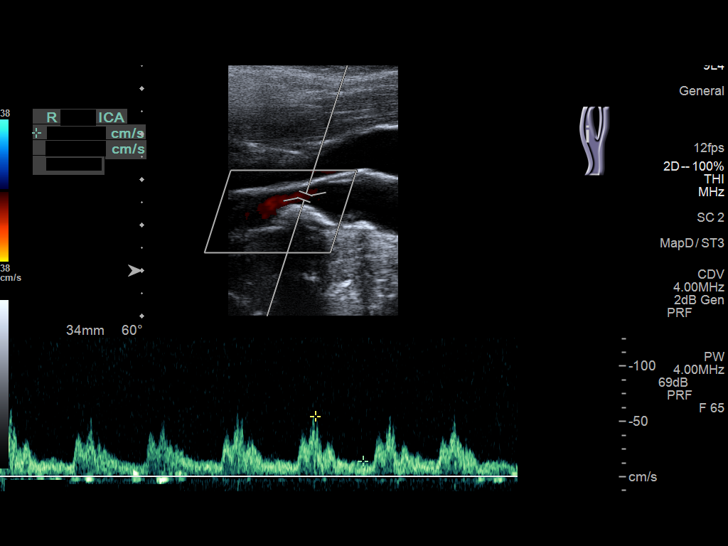
[im 29/67]
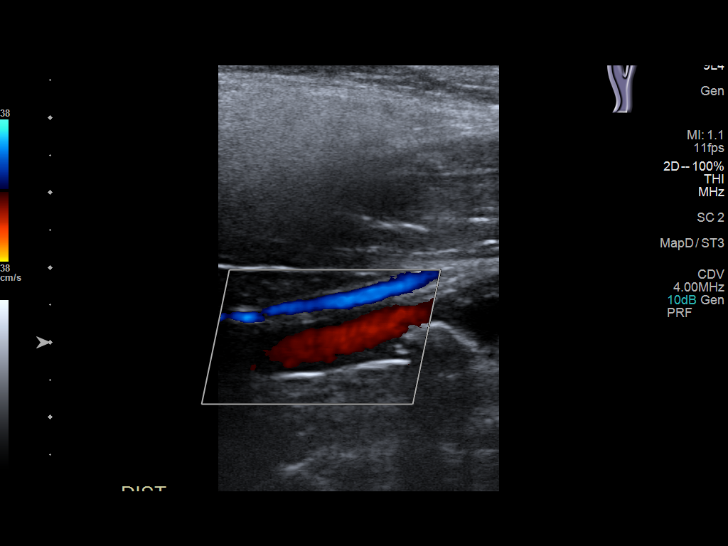
[im 35/67]
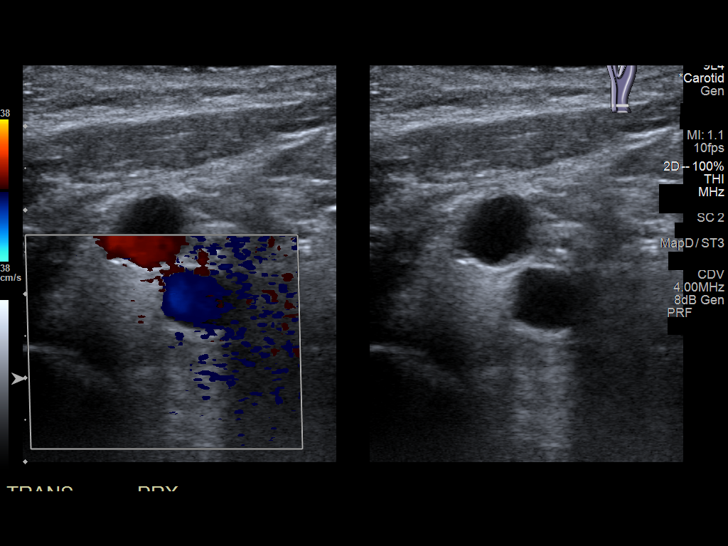
[im 38/67]
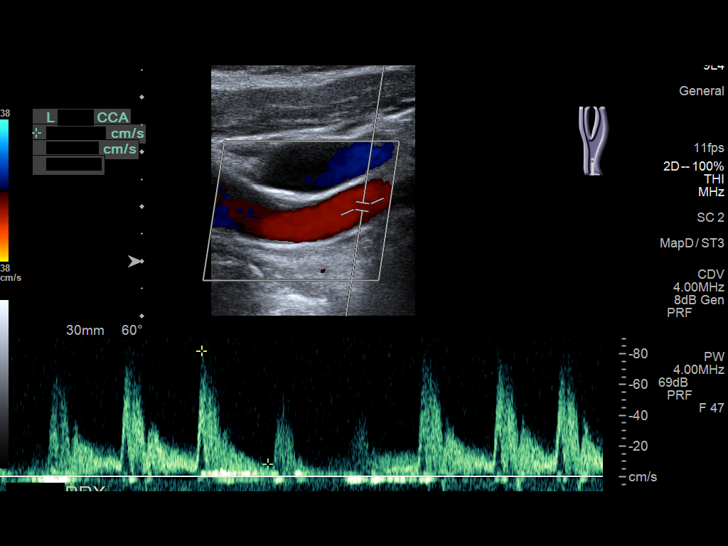
[im 44/67]
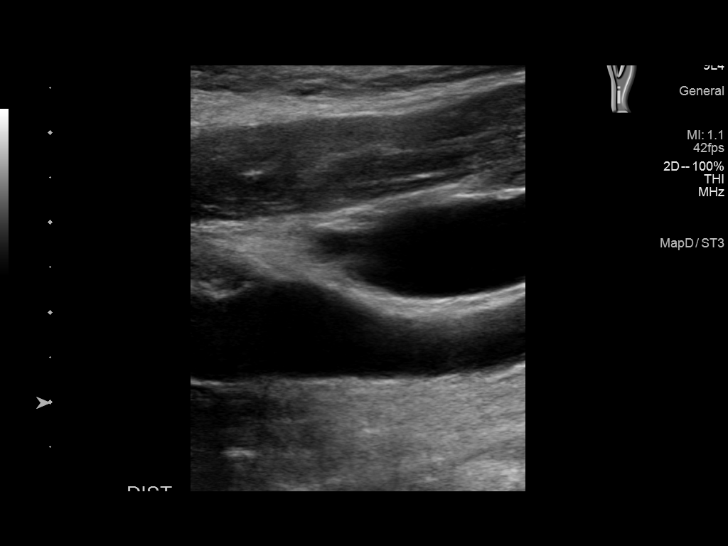
[im 49/67]
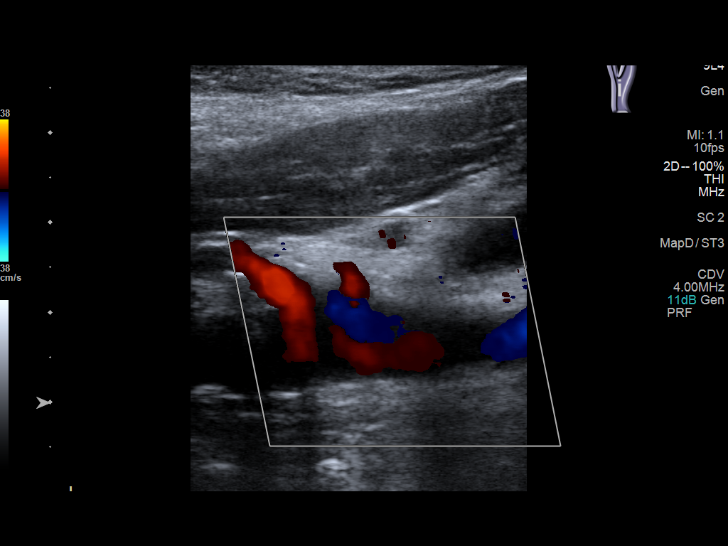
[im 55/67]
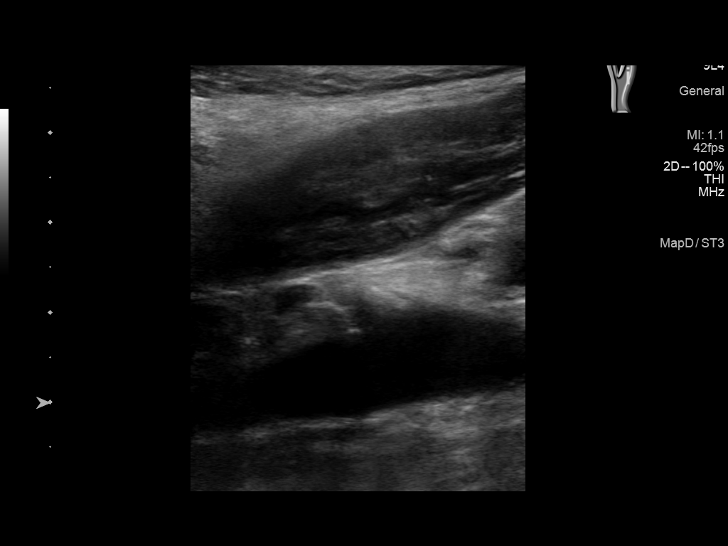
[im 61/67]
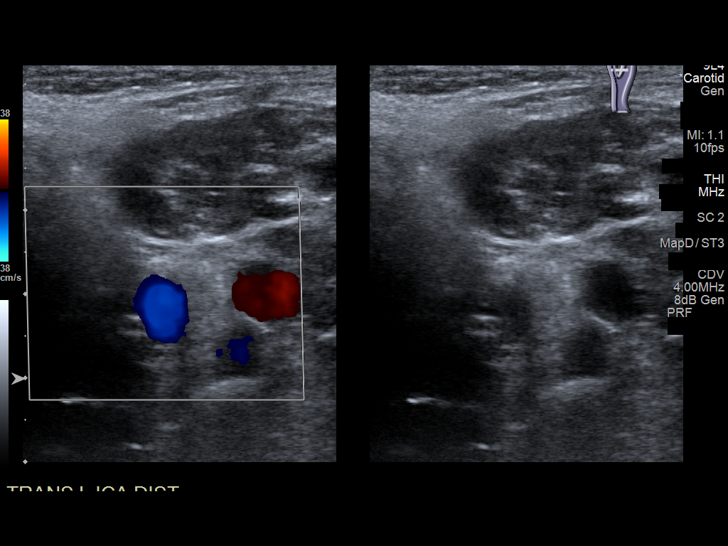
[im 67/67]
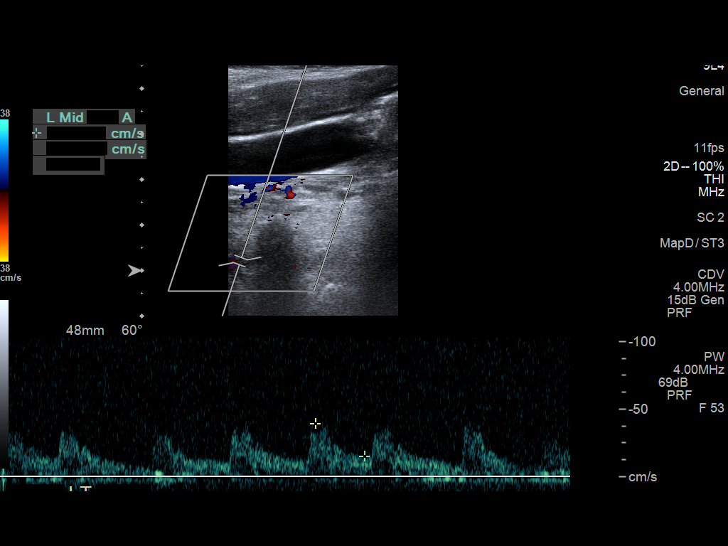

[13 of 24 positions shown; findings below may reference images not displayed]

FINDINGS: Criteria: Quantification of carotid stenosis is based on velocity
parameters that correlate the residual internal carotid diameter
with NASCET-based stenosis levels, using the diameter of the distal
internal carotid lumen as the denominator for stenosis measurement.

The following velocity measurements were obtained:

RIGHT

ICA: 57/17 cm/sec

CCA: 61/17 cm/sec

SYSTOLIC ICA/CCA RATIO:

ECA: 75 cm/sec

LEFT

ICA: 48/12 cm/sec

CCA: 76/24 cm/sec

SYSTOLIC ICA/CCA RATIO:

ECA: 121 cm/sec

RIGHT CAROTID ARTERY: Moderate heterogeneous and calcified
bifurcation atherosclerosis. Despite this, no hemodynamically
significant right ICA stenosis, velocity elevation, turbulent flow.
Degree of narrowing estimated less than 50% ultrasound criteria.

RIGHT VERTEBRAL ARTERY: Small and diminutive. Difficult to
visualize. Appears antegrade.

LEFT CAROTID ARTERY: Similar bifurcation calcific atherosclerosis.
Despite this, no hemodynamically significant left ICA stenosis,
velocity elevation, turbulent flow. Degree of narrowing also
estimated less than 50%.

LEFT VERTEBRAL ARTERY:  Antegrade
IMPRESSION: Moderate bilateral carotid atherosclerosis. No hemodynamically
significant ICA stenosis. Degree of narrowing less than 50%
bilaterally by ultrasound criteria.

Patent antegrade vertebral flow bilaterally

## 2019-06-09 MED ORDER — HYDROXYZINE HCL 25 MG PO TABS
25.0000 mg | ORAL_TABLET | Freq: Every evening | ORAL | Status: DC | PRN
  Administered 2019-06-09: 25 mg via ORAL
  Filled 2019-06-09 (×3): qty 1

## 2019-06-09 MED ORDER — ATORVASTATIN CALCIUM 20 MG PO TABS
40.0000 mg | ORAL_TABLET | Freq: Every day | ORAL | Status: DC
  Administered 2019-06-09: 40 mg via ORAL
  Filled 2019-06-09: qty 2

## 2019-06-09 NOTE — Plan of Care (Signed)
Pt currently has no deficits. Will continue to monitor.

## 2019-06-09 NOTE — Progress Notes (Signed)
PROGRESS NOTE    Cameron Foster.  QAS:[id number] DOB: [birth year] DOA: 06/08/2019 PCP: [name], MD    Assessment & Plan:   Principal Problem:   Acute CVA (cerebrovascular accident) [facility]) Active Problems:   Essential hypertension   Mixed hyperlipidemia   Paroxysmal atrial fibrillation (HCC)    Cameron Foster. is a 79 y.o. male with medical history significant for hypertension, paroxysmal atrial fibrillation not on anticoagulation, prior CVA, HLD, who presented to the emergency room at 2:40 PM after waking at 7 AM with left arm weakness and numbness with decreased grip strength.   # Small acute infarct in the motor strip -Patient with decreased grip strength left hand otherwise no apparent neurologic deficit -MRI showed small acute lacunar infarct -s/p Aspirin 325 mg x 1 -Patient was previously on baby aspirin.  A1c 5.6, LDL 89. --TTE showed no atrial shunt.  Carotid US showed Moderate bilateral carotid atherosclerosis. --PT/OT/SLP --consider ppx anticoagulation --Aggressive lipid management with target LDL<70.   --Increase home lipitor to 40 mg daily (up from 20) --Follow up with Dr. Manuella Ghazi ([facility]) on an outpatient basis    Paroxysmal atrial fibrillation ([place]) -Has documentation of paroxysmal A. fib.  Patient is currently on aspirin but not on systemic anticoagulation for stroke prevention -Continue metoprolol --consider ppx anticoagulation    Essential hypertension -Holding home antihypertensives to allow permissive hypertension    Mixed hyperlipidemia --Increase home lipitor to 40 mg daily (up from 20)  Left fourth finger extensor problem/drop , possible wrist drop -Uncertain whether related to CVA and may be peripheral neuropathy --Per Neuro, Would not keep left fingers chronically in splint since this will likely interfere with rehabilitation and improvement in strength   DVT prophylaxis: Lovenox SQ Code Status: Full code  Family  Communication: not today Disposition Plan: Home tomorrow with HHPT   Subjective and Interval History:  The only thing pt wanted to talk about was his observation status, which was later changed to inpatient due to his acute stroke dx.  No new neurological deficits.  No fever, dyspnea, chest pain, abdominal pain, N/V/D.   Objective: Vitals:   06/09/19 0056 06/09/19 0307 06/09/19 0509 06/09/19 0654  BP: 132/85 126/81 127/82 130/82  Pulse: 69 64 65 69  Resp: 20 16 16 16   Temp: 98.2 F (36.8 C) 98.2 F (36.8 C) 98 F (36.7 C) 97.7 F (36.5 C)  TempSrc: Oral Oral Oral Oral  SpO2: 99% 96% 96% 96%  Weight:      Height:        Intake/Output Summary (Last 24 hours) at 06/09/2019 1504 Last data filed at 06/09/2019 1300 Gross per 24 hour  Intake 600 ml  Output --  Net 600 ml   Filed Weights   06/08/19 1442 06/08/19 2308  Weight: 90.7 kg 99.4 kg    Examination:   Constitutional: NAD, AAOx3 HEENT: conjunctivae and lids normal, EOMI CV: RRR no M,R,G. Distal pulses +2.  No cyanosis.   RESP: CTA B/L, normal respiratory effort  GI: +BS, NTND Extremities: No effusions, edema, or tenderness in BLE.  Left middle finger in splint. SKIN: warm, dry and intact Neuro: II - XII grossly intact.  Sensation intact   Data Reviewed: I have personally reviewed following labs and imaging studies  CBC: Recent Labs  Lab 06/08/19 1452  WBC 6.9  NEUTROABS 5.1  HGB 12.4*  HCT 39.0  MCV 91.1  PLT 798   Basic Metabolic Panel: Recent Labs  Lab 06/08/19 1452  NA  139  K 3.6  CL 106  CO2 23  GLUCOSE 167*  BUN 21  CREATININE 1.39*  CALCIUM 9.3   GFR: Estimated Creatinine Clearance: 50.1 mL/min (A) (by C-G formula based on SCr of 1.39 mg/dL (H)). Liver Function Tests: Recent Labs  Lab 06/08/19 1452  AST 27  ALT 15  ALKPHOS 75  BILITOT 0.8  PROT 7.2  ALBUMIN 3.9   No results for input(s): LIPASE, AMYLASE in the last 168 hours. No results for input(s): AMMONIA in the last 168  hours. Coagulation Profile: Recent Labs  Lab 06/08/19 1452  INR 1.0   Cardiac Enzymes: No results for input(s): CKTOTAL, CKMB, CKMBINDEX, TROPONINI in the last 168 hours. BNP (last 3 results) No results for input(s): PROBNP in the last 8760 hours. HbA1C: Recent Labs    06/09/19 0527  HGBA1C 5.6   CBG: No results for input(s): GLUCAP in the last 168 hours. Lipid Profile: Recent Labs    06/09/19 0527  CHOL 158  HDL 36*  LDLCALC 89  TRIG 165*  CHOLHDL 4.4   Thyroid Function Tests: No results for input(s): TSH, T4TOTAL, FREET4, T3FREE, THYROIDAB in the last 72 hours. Anemia Panel: No results for input(s): VITAMINB12, FOLATE, FERRITIN, TIBC, IRON, RETICCTPCT in the last 72 hours. Sepsis Labs: No results for input(s): PROCALCITON, LATICACIDVEN in the last 168 hours.  Recent Results (from the past 240 hour(s))  SARS CORONAVIRUS 2 (TAT 6-24 HRS) Nasopharyngeal Nasopharyngeal Swab     Status: None   Collection Time: 06/08/19 10:06 PM   Specimen: Nasopharyngeal Swab  Result Value Ref Range Status   SARS Coronavirus 2 NEGATIVE NEGATIVE Final    Comment: (NOTE) SARS-CoV-2 target nucleic acids are NOT DETECTED. The SARS-CoV-2 RNA is generally detectable in upper and lower respiratory specimens during the acute phase of infection. Negative results do not preclude SARS-CoV-2 infection, do not rule out co-infections with other pathogens, and should not be used as the sole basis for treatment or other patient management decisions. Negative results must be combined with clinical observations, patient history, and epidemiological information. The expected result is Negative. Fact Sheet for Patients: SugarRoll.be Fact Sheet for Healthcare Providers: https://www.woods-mathews.com/ This test is not yet approved or cleared by the [place] FDA and  has been authorized for detection and/or diagnosis of SARS-CoV-2 by FDA under an Emergency  Use Authorization (EUA). This EUA will remain  in effect (meaning this test can be used) for the duration of the COVID-19 declaration under Section 56 4(b)(1) of the Act, 21 U.S.C. section 360bbb-3(b)(1), unless the authorization is terminated or revoked sooner. Performed at [facility], [place] [address] [place], [place] [address]       Radiology Studies: CT HEAD WO CONTRAST  Result Date: 06/08/2019 CLINICAL DATA:  TIA, possible stroke EXAM: CT HEAD WITHOUT CONTRAST TECHNIQUE: Contiguous axial images were obtained from the base of the skull through the vertex without intravenous contrast. COMPARISON:  CT head dated 10/20/2018. MRI brain dated 10/21/2018. FINDINGS: Brain: No evidence of acute infarction, hemorrhage, hydrocephalus, extra-axial collection or mass lesion/mass effect. Subcortical Kissick matter and periventricular small vessel ischemic changes. 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 Vascular: Intracranial atherosclerosis. Skull: Normal. Negative for fracture or focal lesion. Sinuses/Orbits: The visualized paranasal sinuses are essentially clear. The mastoid air cells are unopacified. Other: None. IMPRESSION: No evidence of acute intracranial abnormality. 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 Small vessel ischemic changes. Electronically Signed   By: Julian Hy M.D.   On: 06/08/2019  15:18   MR Brain Wo Contrast (neuro protocol)  Result Date: 06/08/2019 CLINICAL DATA:  79 year old male with stroke-like symptoms. EXAM: MRI HEAD WITHOUT CONTRAST TECHNIQUE: Multiplanar, multiecho pulse sequences of the brain and surrounding structures were obtained without intravenous contrast. COMPARISON:  Head CT earlier today. Brain MRI 10/21/2018. FINDINGS: Brain: There is a small cortical and subcortical Ruffner matter linear area of restricted diffusion at the right motor strip (series 2, image 98 and series 4, image 56). Faint T2 and FLAIR hyperintensity. No  associated hemorrhage or mass effect. No other restricted diffusion. Numerous small chronic infarcts in both cerebellar hemispheres. Stable prominent chronic lacunar infarct in the right thalamus. Superimposed dilated perivascular spaces throughout the basal ganglia. Scattered small cerebral Macfadden matter chronic lacunar infarcts. Stable patchy T2 and FLAIR hyperintensity in the pons. Occasional chronic micro hemorrhages were better demonstrated on the prior MRI. No midline shift, mass effect, evidence of mass lesion, ventriculomegaly, extra-axial collection or acute intracranial hemorrhage. Cervicomedullary junction and pituitary are within normal limits. Vascular: Major intracranial vascular flow voids are stable from last year. Dominant left vertebral artery. Mild generalized intracranial artery tortuosity. Skull and upper cervical spine: Stable visible upper cervical spine degeneration. Normal bone marrow signal. Sinuses/Orbits: Stable and negative. Other: Mastoids remain clear. Visible internal auditory structures appear normal. IMPRESSION: 1. Positive for a small acute lacunar infarct at the right motor strip. No associated hemorrhage or mass effect. 2. Advanced underlying chronic small and medium-sized vessel ischemia otherwise stable since last year. Electronically Signed   By: Genevie Ann M.D.   On: 06/08/2019 20:30   US Carotid Bilateral (at [facility] and [facility] only)  Result Date: 06/09/2019 CLINICAL DATA:  Stroke symptoms, hypertension, hyperlipidemia EXAM: BILATERAL CAROTID DUPLEX ULTRASOUND TECHNIQUE: Pearline Cables scale imaging, color Doppler and duplex ultrasound were performed of bilateral carotid and vertebral arteries in the neck. COMPARISON:  10/10/2018 FINDINGS: Criteria: Quantification of carotid stenosis is based on velocity parameters that correlate the residual internal carotid diameter with NASCET-based stenosis levels, using the diameter of the distal internal carotid lumen as the denominator for stenosis  measurement. The following velocity measurements were obtained: RIGHT ICA: 57/17 cm/sec CCA: 35/36 cm/sec SYSTOLIC ICA/CCA RATIO:  0.9 ECA: 75 cm/sec LEFT ICA: 48/12 cm/sec CCA: 14/43 cm/sec SYSTOLIC ICA/CCA RATIO:  0.6 ECA: 121 cm/sec RIGHT CAROTID ARTERY: Moderate heterogeneous and calcified bifurcation atherosclerosis. Despite this, no hemodynamically significant right ICA stenosis, velocity elevation, turbulent flow. Degree of narrowing estimated less than 50% ultrasound criteria. RIGHT VERTEBRAL ARTERY: Small and diminutive. Difficult to visualize. Appears antegrade. LEFT CAROTID ARTERY: Similar bifurcation calcific atherosclerosis. Despite this, no hemodynamically significant left ICA stenosis, velocity elevation, turbulent flow. Degree of narrowing also estimated less than 50%. LEFT VERTEBRAL ARTERY:  Antegrade IMPRESSION: Moderate bilateral carotid atherosclerosis. No hemodynamically significant ICA stenosis. Degree of narrowing less than 50% bilaterally by ultrasound criteria. Patent antegrade vertebral flow bilaterally Electronically Signed   By: Jerilynn Mages.  Shick M.D.   On: 06/09/2019 09:53   ECHOCARDIOGRAM COMPLETE  Result Date: 06/09/2019    ECHOCARDIOGRAM REPORT   Patient Name:   Cameron Foster. Date of Exam: 06/09/2019 Medical Rec #:  [id number]          Height:       68.0 in Accession #:    [id number]         Weight:       219.1 lb Date of Birth:  May 08, 1940          BSA:  2.125 m Patient Age:    33 years           BP:           130/82 mmHg Patient Gender: M                  HR:           72 bpm. Exam Location:  [facility] Procedure: 2D Echo Indications:     Stroke 434.91/ I163.9  History:         Patient has prior history of Echocardiogram examinations, most                  recent 03/29/2018.  Sonographer:     Arville Go RDCS Referring Phys:  [id number] Athena Masse Diagnosing Phys: Yolonda Kida MD IMPRESSIONS  1. Left ventricular ejection fraction, by estimation, is 55 to 60%. The left  ventricle has normal function. The left ventricle has no regional wall motion abnormalities. Left ventricular diastolic parameters were normal.  2. Right ventricular systolic function is normal. The right ventricular size is normal.  3. The mitral valve is normal in structure and function. Trivial mitral valve regurgitation.  4. The aortic valve is normal in structure and function. Aortic valve regurgitation is not visualized. FINDINGS  Left Ventricle: Left ventricular ejection fraction, by estimation, is 55 to 60%. The left ventricle has normal function. The left ventricle has no regional wall motion abnormalities. The left ventricular internal cavity size was normal in size. There is  no left ventricular hypertrophy. Left ventricular diastolic parameters were normal. Right Ventricle: The right ventricular size is normal. No increase in right ventricular wall thickness. Right ventricular systolic function is normal. Left Atrium: Left atrial size was normal in size. Right Atrium: Right atrial size was normal in size. Pericardium: There is no evidence of pericardial effusion. Mitral Valve: The mitral valve is normal in structure and function. Trivial mitral valve regurgitation. Tricuspid Valve: The tricuspid valve is normal in structure. Tricuspid valve regurgitation is mild. Aortic Valve: The aortic valve is normal in structure and function. Aortic valve regurgitation is not visualized. Aortic valve peak gradient measures 5.7 mmHg. Pulmonic Valve: The pulmonic valve was normal in structure. Pulmonic valve regurgitation is not visualized. Aorta: The aortic root is normal in size and structure. IAS/Shunts: No atrial level shunt detected by color flow Doppler.  LEFT VENTRICLE PLAX 2D LVIDd:         5.49 cm  Diastology LVIDs:         3.91 cm  LV e' lateral:   5.77 cm/s LV PW:         1.30 cm  LV E/e' lateral: 12.0 LV IVS:        1.18 cm  LV e' medial:    6.74 cm/s LVOT diam:     2.10 cm  LV E/e' medial:  10.3 LV SV:          50.57 ml LV SV Index:   23.80 LVOT Area:     3.46 cm  RIGHT VENTRICLE RV Basal diam:  2.86 cm RV S prime:     13.20 cm/s TAPSE (M-mode): 2.1 cm LEFT ATRIUM           Index       RIGHT ATRIUM           Index LA diam:      4.10 cm 1.93 cm/m  RA Area:     12.80 cm LA Vol (A2C): 59.4  ml 27.96 ml/m RA Volume:   28.90 ml  13.60 ml/m LA Vol (A4C): 9.4 ml  4.43 ml/m  AORTIC VALVE AV Area (Vmax): 2.30 cm AV Vmax:        119.00 cm/s AV Peak Grad:   5.7 mmHg LVOT Vmax:      79.10 cm/s LVOT Vmean:     45.700 cm/s LVOT VTI:       0.146 m  AORTA Ao Root diam: 3.30 cm Ao Asc diam:  3.50 cm MITRAL VALVE               TRICUSPID VALVE MV Area (PHT): 3.31 cm    TV Peak grad:   17.5 mmHg MV Decel Time: 229 msec    TV Vmax:        2.09 m/s MV E velocity: 69.40 cm/s MV A velocity: 52.90 cm/s  SHUNTS MV E/A ratio:  1.31        Systemic VTI:  0.15 m                            Systemic Diam: 2.10 cm Dwayne D Callwood MD Electronically signed by Yolonda Kida MD Signature Date/Time: 06/09/2019/1:10:53 PM    Final      Scheduled Meds: . aspirin  300 mg Rectal Daily   Or  . aspirin  325 mg Oral Daily  . enoxaparin (LOVENOX) injection  40 mg Subcutaneous Q24H   Continuous Infusions:   LOS: 0 days     Enzo Bi, MD [facility] If 7PM-7AM, please contact night-coverage 06/09/2019, 3:04 PM

## 2019-06-09 NOTE — Consult Note (Signed)
Requesting Physician: Cameron Foster    Chief Complaint: LUE weakness  I have been asked by Dr. Billie Foster to see this Cameron Foster in consultation for acute infarct.  HPI: Cameron Foster. is an 79 y.o. male with a history of AF, HTN, HLD who reports that on yesterday he noted difficulty on using his LUE when he awakened.  He was at baseline on going to sleep the night before.  With no improvement in symptoms presented for evaluation.  Initial NIHSS of 0.  Date last known well: 06/07/2019 Time last known well: Time: 23:00 tPA Given: No: Outside time window  Past Medical History:  Diagnosis Date  . Alcoholism ([place])   . Arthritis   . Atrial fibrillation ([place])    one episode  . Colon polyp   . Depression    Son died 2014-06-27  . Diverticulitis   . Diverticulosis 30 years  . Dysrhythmia    At Fib 1995  . GERD (gastroesophageal reflux disease)   . Hyperlipidemia   . Hypertension   . Irritable bowel syndrome   . Prostate cancer ([place]) 06/27/2010   treated with radiation therapy. Prostate  . Vasovagal syncope     Past Surgical History:  Procedure Laterality Date  . [place] no stents  . CATARACT EXTRACTION, BILATERAL    . COLON RESECTION SIGMOID N/A 12/07/2016   Procedure: COLON RESECTION SIGMOID;  Surgeon: Clayburn Pert, MD;  Location: [facility] ORS;  Service: General;  Laterality: N/A;  . COLON SURGERY  11/2016   Colostomy  . COLONOSCOPY  Jun 27, 2014  . COLONOSCOPY WITH PROPOFOL N/A 05/30/2016   Procedure: COLONOSCOPY WITH PROPOFOL;  Surgeon: Lollie Sails, MD;  Location: [facility] ENDOSCOPY;  Service: Endoscopy;  Laterality: N/A;  . COLOSTOMY Left 12/07/2016   Procedure: COLOSTOMY;  Surgeon: Clayburn Pert, MD;  Location: [facility] ORS;  Service: General;  Laterality: Left;  . COLOSTOMY REVERSAL N/A 03/21/2017   Procedure: COLOSTOMY REVERSAL;  Surgeon: Clayburn Pert, MD;  Location: [facility] ORS;  Service: General;  Laterality: N/A;  . COLOSTOMY TAKEDOWN N/A 03/21/2017   Procedure:  LAPAROSCOPIC COLOSTOMY TAKEDOWN;  Surgeon: Clayburn Pert, MD;  Location: [facility] ORS;  Service: General;  Laterality: N/A;  . CYSTOSCOPY WITH STENT PLACEMENT Bilateral 03/21/2017   Procedure: CYSTOSCOPY WITH LIGHTED STENT PLACEMENT;  Surgeon: Abbie Sons, MD;  Location: [facility] ORS;  Service: Urology;  Laterality: Bilateral;  . ESOPHAGOGASTRODUODENOSCOPY    . ESOPHAGOGASTRODUODENOSCOPY N/A 05/30/2016   Procedure: ESOPHAGOGASTRODUODENOSCOPY (EGD);  Surgeon: Lollie Sails, MD;  Location: [facility] ENDOSCOPY;  Service: Endoscopy;  Laterality: N/A;  . EYE SURGERY     cataracts  . FLEXIBLE SIGMOIDOSCOPY N/A 03/21/2017   Procedure: FLEXIBLE SIGMOIDOSCOPY;  Surgeon: Clayburn Pert, MD;  Location: [facility] ORS;  Service: General;  Laterality: N/A;  . FRACTURE SURGERY Bilateral    right arm and left wrist  . INCISION AND DRAINAGE ABSCESS N/A 12/07/2016   Procedure: DRAINAGE  OF INTRA ABDOMINAL ABSCESS;  Surgeon: Clayburn Pert, MD;  Location: [facility] ORS;  Service: General;  Laterality: N/A;  . KNEE ARTHROSCOPY    . LAPAROTOMY N/A 12/07/2016   Procedure: EXPLORATORY LAPAROTOMY;  Surgeon: Clayburn Pert, MD;  Location: [facility] ORS;  Service: General;  Laterality: N/A;  . PROSTATE SURGERY     Microwave therapy  . TONSILLECTOMY    . TOTAL KNEE ARTHROPLASTY Right 07/27/2016   Procedure: TOTAL KNEE ARTHROPLASTY;  Surgeon: [name], MD;  Location: [facility] ORS;  Service: Orthopedics;  Laterality: Right;  Dr. Erlene Quan had to  place Urinary catheter due to prostate cancer history.  Using flexible scope.    Family History  Problem Relation Age of Onset  . Lung cancer Father        smoker  . Other Mother   . Sudden death Son        due to "Blood clots"  . Bipolar disorder Son   . Kidney disease Daughter        congenital one small kidney  . Prostate cancer Neg Hx   . Bladder Cancer Neg Hx    Social History:  reports that he quit smoking about 31 years ago. His smoking use included cigarettes. He has a 27.00  pack-year smoking history. He has quit using smokeless tobacco.  His smokeless tobacco use included chew. He reports current alcohol use of about 3.0 standard drinks of alcohol per week. He reports that he does not use drugs.  Allergies:  Allergies  Allergen Reactions  . Augmentin [Amoxicillin-Pot Clavulanate] Other (See Comments)    Abdominal upset Has Cameron Foster had a PCN reaction causing immediate rash, facial/tongue/throat swelling, SOB or lightheadedness with hypotension: Unknown Has Cameron Foster had a PCN reaction causing severe rash involving mucus membranes or skin necrosis: Unknown Has Cameron Foster had a PCN reaction that required hospitalization: Unknown Has Cameron Foster had a PCN reaction occurring within the last 10 years: Unknown If all of the above answers are "NO", then may proceed with Cephalosporin use.   . Formaldehyde Rash  . Tape Rash    Pt states he is not allergic to tape.    Medications:  I have reviewed the Cameron Foster's current medications. Prior to Admission:  Medications Prior to Admission  Medication Sig Dispense Refill Last Dose  . amLODipine (NORVASC) 10 MG tablet TAKE 1 TABLET BY MOUTH EVERY DAY 90 tablet 1 06/08/2019 at 0830  . aspirin 81 MG EC tablet Take 81 mg by mouth daily.    06/08/2019 at 0830  . atorvastatin (LIPITOR) 20 MG tablet Take 1 tablet (20 mg total) by mouth daily at 6 PM. 90 tablet 1 06/08/2019 at 0830  . Cyanocobalamin (VITAMIN B 12 PO) Take 1,000 mcg by mouth daily.   06/08/2019 at 0830  . docusate sodium (STOOL SOFTENER) 100 MG capsule Take 100 mg by mouth 2 (two) times daily.   06/08/2019 at 0830  . fluticasone (FLONASE) 50 MCG/ACT nasal spray Place 1 spray into both nostrils daily as needed for allergies or rhinitis. 16 g 3 06/08/2019 at 0830  . furosemide (LASIX) 20 MG tablet Take 1 tablet (20 mg total) by mouth daily. 30 tablet 0 06/08/2019 at 0830  . lisinopril (ZESTRIL) 10 MG tablet TAKE 1 TABLET BY MOUTH EVERY DAY 90 tablet 1 06/08/2019 at 0830  . meclizine  (ANTIVERT) 25 MG tablet Take 25 mg by mouth 2 (two) times daily as needed.   prn at prn  . meloxicam (MOBIC) 15 MG tablet Take 15 mg by mouth daily.    06/08/2019 at 0830  . metoprolol succinate (TOPROL-XL) 50 MG 24 hr tablet TAKE 1 TABLET (50 MG TOTAL) BY MOUTH DAILY. TAKE WITH OR IMMEDIATELY FOLLOWING A MEAL. 90 tablet 1 06/08/2019 at 0830  . Multiple Vitamin (MULTIVITAMIN WITH MINERALS) TABS tablet Take 1 tablet by mouth daily. 30 tablet 0 06/08/2019 at 0830  . omeprazole (PRILOSEC) 20 MG capsule TAKE 1 CAPSULE (20 MG TOTAL) BY MOUTH DAILY AS NEEDED (HEARTBURN). 90 capsule 1 06/08/2019 at 0830  . sertraline (ZOLOFT) 100 MG tablet Take 1 tablet (100 mg total)  by mouth daily. 90 tablet 3 06/08/2019 at 0830   Scheduled: . aspirin  300 mg Rectal Daily   Or  . aspirin  325 mg Oral Daily  . enoxaparin (LOVENOX) injection  40 mg Subcutaneous Q24H    ROS: History obtained from the Cameron Foster  General ROS: negative for - chills, fatigue, fever, night sweats, weight gain or weight loss Psychological ROS: negative for - behavioral disorder, hallucinations, memory difficulties, mood swings or suicidal ideation Ophthalmic ROS: negative for - blurry vision, double vision, eye pain or loss of vision ENT ROS: negative for - epistaxis, nasal discharge, oral lesions, sore throat, tinnitus or vertigo Allergy and Immunology ROS: negative for - hives or itchy/watery eyes Hematological and Lymphatic ROS: negative for - bleeding problems, bruising or swollen lymph nodes Endocrine ROS: negative for - galactorrhea, hair pattern changes, polydipsia/polyuria or temperature intolerance Respiratory ROS: negative for - cough, hemoptysis, shortness of breath or wheezing Cardiovascular ROS: negative for - chest pain, dyspnea on exertion, edema or irregular heartbeat Gastrointestinal ROS: negative for - abdominal pain, diarrhea, hematemesis, nausea/vomiting or stool incontinence Genito-Urinary ROS: negative for - dysuria,  hematuria, incontinence or urinary frequency/urgency Musculoskeletal ROS: chronic RLE weakness    Neurological ROS: as noted in HPI Dermatological ROS: negative for rash and skin lesion changes  Physical Examination: Blood pressure 130/82, pulse 69, temperature 97.7 F (36.5 C), temperature source Oral, resp. rate 16, height 5' 8"  (1.727 m), weight 99.4 kg, SpO2 96 %.  HEENT-  Normocephalic, no lesions, without obvious abnormality.  Normal external eye and conjunctiva.  Normal TM's bilaterally.  Normal auditory canals and external ears. Normal external nose, mucus membranes and septum.  Normal pharynx. Cardiovascular- S1, S2 normal, pulses palpable throughout   Lungs- chest clear, no wheezing, rales, normal symmetric air entry Abdomen- soft, non-tender; bowel sounds normal; no masses,  no organomegaly Extremities- RLE edema Lymph-no adenopathy palpable Musculoskeletal-no joint tenderness, deformity Skin-warm and dry, no hyperpigmentation, vitiligo, or suspicious lesions  Neurological Examination   Mental Status: Alert, oriented, thought content appropriate.  Speech fluent without evidence of aphasia.  Able to follow 3 step commands without difficulty. Cranial Nerves: II: Visual fields grossly normal, pupils equal, round, reactive to light and accommodation III,IV, VI: ptosis not present, extra-ocular motions intact bilaterally V,VII: smile symmetric, facial light touch sensation normal bilaterally VIII: hearing normal bilaterally IX,X: gag reflex present XI: bilateral shoulder shrug XII: midline tongue extension Motor: Right : Upper extremity   5/5    Left:     Upper extremity   5-/5  Lower extremity   5/5     Lower extremity   5/5 Tone and bulk:normal tone throughout; no atrophy noted.  Fingers on left hand in splint. Sensory: Pinprick and light touch intact throughout, bilaterally Deep Tendon Reflexes: Symmetric throughout Plantars: Right: mute   Left: mute Cerebellar: Normal  finger-to-nose and normal heel-to-shin testing bilaterally Gait: not tested sue to safety concerns    Laboratory Studies:  Basic Metabolic Panel: Recent Labs  Lab 06/08/19 1452  NA 139  K 3.6  CL 106  CO2 23  GLUCOSE 167*  BUN 21  CREATININE 1.39*  CALCIUM 9.3    Liver Function Tests: Recent Labs  Lab 06/08/19 1452  AST 27  ALT 15  ALKPHOS 75  BILITOT 0.8  PROT 7.2  ALBUMIN 3.9   No results for input(s): LIPASE, AMYLASE in the last 168 hours. No results for input(s): AMMONIA in the last 168 hours.  CBC: Recent Labs  Lab 06/08/19  1452  WBC 6.9  NEUTROABS 5.1  HGB 12.4*  HCT 39.0  MCV 91.1  PLT 239    Cardiac Enzymes: No results for input(s): CKTOTAL, CKMB, CKMBINDEX, TROPONINI in the last 168 hours.  BNP: Invalid input(s): POCBNP  CBG: No results for input(s): GLUCAP in the last 168 hours.  Microbiology: Results for orders placed or performed during the hospital encounter of 06/08/19  SARS CORONAVIRUS 2 (TAT 6-24 HRS) Nasopharyngeal Nasopharyngeal Swab     Status: None   Collection Time: 06/08/19 10:06 PM   Specimen: Nasopharyngeal Swab  Result Value Ref Range Status   SARS Coronavirus 2 NEGATIVE NEGATIVE Final    Comment: (NOTE) SARS-CoV-2 target nucleic acids are NOT DETECTED. The SARS-CoV-2 RNA is generally detectable in upper and lower respiratory specimens during the acute phase of infection. Negative results do not preclude SARS-CoV-2 infection, do not rule out co-infections with other pathogens, and should not be used as the sole basis for treatment or other Cameron Foster management decisions. Negative results must be combined with clinical observations, Cameron Foster history, and epidemiological information. The expected result is Negative. Fact Sheet for Patients: SugarRoll.be Fact Sheet for Healthcare Providers: https://www.woods-mathews.com/ This test is not yet approved or cleared by the [place]  FDA and  has been authorized for detection and/or diagnosis of SARS-CoV-2 by FDA under an Emergency Use Authorization (EUA). This EUA will remain  in effect (meaning this test can be used) for the duration of the COVID-19 declaration under Section 56 4(b)(1) of the Act, 21 U.S.C. section 360bbb-3(b)(1), unless the authorization is terminated or revoked sooner. Performed at [facility], [place] [address], [place], [place] [address]     Coagulation Studies: Recent Labs    06/08/19 1452  LABPROT 13.4  INR 1.0    Urinalysis:  Recent Labs  Lab 06/08/19 1738  COLORURINE YELLOW*  LABSPEC 1.015  PHURINE 5.0  GLUCOSEU NEGATIVE  HGBUR NEGATIVE  BILIRUBINUR NEGATIVE  KETONESUR NEGATIVE  PROTEINUR NEGATIVE  NITRITE NEGATIVE  LEUKOCYTESUR NEGATIVE    Lipid Panel:    Component Value Date/Time   CHOL 158 06/09/2019 0527   CHOL 213 (H) 05/18/2018 0826   TRIG 165 (H) 06/09/2019 0527   HDL 36 (L) 06/09/2019 0527   HDL 43 05/18/2018 0826   CHOLHDL 4.4 06/09/2019 0527   VLDL 33 06/09/2019 0527   LDLCALC 89 06/09/2019 0527   LDLCALC 141 (H) 05/18/2018 0826    HgbA1C:  Lab Results  Component Value Date   HGBA1C 5.6 06/09/2019    Urine Drug Screen:  No results found for: LABOPIA, COCAINSCRNUR, LABBENZ, AMPHETMU, THCU, LABBARB  Alcohol Level: No results for input(s): ETH in the last 168 hours.  Other results: EKG: sinus rhythm at 84 bpm.  Imaging: CT HEAD WO CONTRAST  Result Date: 06/08/2019 CLINICAL DATA:  TIA, possible stroke EXAM: CT HEAD WITHOUT CONTRAST TECHNIQUE: Contiguous axial images were obtained from the base of the skull through the vertex without intravenous contrast. COMPARISON:  CT head dated 10/20/2018. MRI brain dated 10/21/2018. FINDINGS: Brain: No evidence of acute infarction, hemorrhage, hydrocephalus, extra-axial collection or mass lesion/mass effect. Subcortical Mclean matter and periventricular small vessel ischemic changes. Old right thalamic and  bilateral cerebellar infarcts. Vascular: Intracranial atherosclerosis. Skull: Normal. Negative for fracture or focal lesion. Sinuses/Orbits: The visualized paranasal sinuses are essentially clear. The mastoid air cells are unopacified. Other: None. IMPRESSION: No evidence of acute intracranial abnormality. 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 Small vessel ischemic changes. Electronically Signed   By:  Julian Hy M.D.   On: 06/08/2019 15:18   MR Brain Wo Contrast (neuro protocol)  Result Date: 06/08/2019 CLINICAL DATA:  78 year old male with stroke-like symptoms. EXAM: MRI HEAD WITHOUT CONTRAST TECHNIQUE: Multiplanar, multiecho pulse sequences of the brain and surrounding structures were obtained without intravenous contrast. COMPARISON:  Head CT earlier today. Brain MRI 10/21/2018. FINDINGS: Brain: There is a small cortical and subcortical Dennie matter linear area of restricted diffusion at the right motor strip (series 2, image 98 and series 4, image 56). Faint T2 and FLAIR hyperintensity. No associated hemorrhage or mass effect. No other restricted diffusion. Numerous small chronic infarcts in both cerebellar hemispheres. Stable prominent chronic lacunar infarct in the right thalamus. Superimposed dilated perivascular spaces throughout the basal ganglia. Scattered small cerebral Smoak matter chronic lacunar infarcts. Stable patchy T2 and FLAIR hyperintensity in the pons. Occasional chronic micro hemorrhages were better demonstrated on the prior MRI. No midline shift, mass effect, evidence of mass lesion, ventriculomegaly, extra-axial collection or acute intracranial hemorrhage. Cervicomedullary junction and pituitary are within normal limits. Vascular: Major intracranial vascular flow voids are stable from last year. Dominant left vertebral artery. Mild generalized intracranial artery tortuosity. Skull and upper cervical spine: Stable visible upper cervical spine degeneration. Normal  bone marrow signal. Sinuses/Orbits: Stable and negative. Other: Mastoids remain clear. Visible internal auditory structures appear normal. IMPRESSION: 1. Positive for a small acute lacunar infarct at the right motor strip. No associated hemorrhage or mass effect. 2. Advanced underlying chronic small and medium-sized vessel ischemia otherwise stable since last year. Electronically Signed   By: Genevie Ann M.D.   On: 06/08/2019 20:30   US Carotid Bilateral (at [facility] and [facility] only)  Result Date: 06/09/2019 CLINICAL DATA:  Stroke symptoms, hypertension, hyperlipidemia EXAM: BILATERAL CAROTID DUPLEX ULTRASOUND TECHNIQUE: Pearline Cables scale imaging, color Doppler and duplex ultrasound were performed of bilateral carotid and vertebral arteries in the neck. COMPARISON:  10/10/2018 FINDINGS: Criteria: Quantification of carotid stenosis is based on velocity parameters that correlate the residual internal carotid diameter with NASCET-based stenosis levels, using the diameter of the distal internal carotid lumen as the denominator for stenosis measurement. The following velocity measurements were obtained: RIGHT ICA: 57/17 cm/sec CCA: 70/96 cm/sec SYSTOLIC ICA/CCA RATIO:  0.9 ECA: 75 cm/sec LEFT ICA: 48/12 cm/sec CCA: 28/36 cm/sec SYSTOLIC ICA/CCA RATIO:  0.6 ECA: 121 cm/sec RIGHT CAROTID ARTERY: Moderate heterogeneous and calcified bifurcation atherosclerosis. Despite this, no hemodynamically significant right ICA stenosis, velocity elevation, turbulent flow. Degree of narrowing estimated less than 50% ultrasound criteria. RIGHT VERTEBRAL ARTERY: Small and diminutive. Difficult to visualize. Appears antegrade. LEFT CAROTID ARTERY: Similar bifurcation calcific atherosclerosis. Despite this, no hemodynamically significant left ICA stenosis, velocity elevation, turbulent flow. Degree of narrowing also estimated less than 50%. LEFT VERTEBRAL ARTERY:  Antegrade IMPRESSION: Moderate bilateral carotid atherosclerosis. No hemodynamically  significant ICA stenosis. Degree of narrowing less than 50% bilaterally by ultrasound criteria. Patent antegrade vertebral flow bilaterally Electronically Signed   By: Jerilynn Mages.  Shick M.D.   On: 06/09/2019 09:53    Assessment: 79 y.o. male  with a history of AF, HTN, HLD presenting with LUE weakness.  MRI of the brain personally reviewed and shows a small acute infarct in the motor strip.  Chronic small vessel ischemic changes noted as well.  Cameron Foster with a history of AF on ASA.  Cameron Foster reports that afib when diagnosed was attributed to metabolic abnormalities.  He does not report ever wearing a device for prolonged cardiac monitoring.  Review of echocardiogram from 2019 also  shows a possible small PFO.  Although etiology for acute infarct may be small vessel disease with history there is concern for an embolic etiology.  Carotid dopplers show no evidence of hemodynamically significant stenosis.  Echocardiogram pending.  A1c 5.6, LDL 89.  Stroke Risk Factors - atrial fibrillation, hyperlipidemia and hypertension  Plan: 1. PT consult, OT consult, Speech consult.  Would not keep left fingers chronically in splint since this will likely interfere with rehabilitation and improvement in strength 2. Echocardiogram pending 3. Prophylactic therapy-Eliquis recommended.  Once started would not continue ASA at this time 4. Telemetry monitoring 5. Frequent neuro checks 6. BLE dopplers to rule out DVT 7. Aggressive lipid management with target LDL<70. 8. Follow up with Dr. Manuella Ghazi ([facility]) on an outpatient basis  Alexis Goodell, MD Neurology [phone] 06/09/2019, 11:45 AM

## 2019-06-09 NOTE — Evaluation (Signed)
Physical Therapy Evaluation Patient Details Name: Cameron Foster. MRN: [id number] DOB: [birth year] Today's Date: 06/09/2019   History of Present Illness  Patient is a pleasant 79 year old male with PMH of HTN, Paroxysmal A fib not on anticoagulation, CVA, HLD, TIA, diplopia,  arthrtisis, depression, alcoholism, GERD, prostate cancer with radiation tx, and vasovagal syncope. Presented to ED with left arm weakness and numbness with decreased grip strength. Denied LE involvement. MRI showed small acute lunar infarct the right motor strip CT showed old right thalaamic and bilateral cerebellar infarcts. TPA not given due to outside of window.  Clinical Impression  Patient is a pleasant 79 year old male who presents with decreased stability. Patient is in bed upon PT arrival and recognizes PT as treating therapist in outpatient facility ~ 1 month ago. Patient demonstrates safe bed mobility and transfers. He declines ambulating out in hallway due to fear of exposure with COVID, agreeable to ambulate multiple laps within room. Decreased stability noted by this writer since seen last month with challenges to balance with head turns, distraction, and sudden termination of movement. Patient frequently requested to speak to charge nurse or nurse about status of OBS, relayed info to nursing. Patient will benefit from physical therapy while in the hospital to increase stability with high level mobility and to decrease falls risk. Upon discharge patient will benefit from return to outpatient physical therapy to return to PLOF, address deficits in stability, and increase capacity for functional mobility for improved quality of life.     Follow Up Recommendations Outpatient PT    Equipment Recommendations  None recommended by PT    Recommendations for Other Services OT consult     Precautions / Restrictions        Mobility  Bed Mobility Overal bed mobility: Independent             General bed  mobility comments: bed mobility ind with minimal use of hands. No unsteadiness or c/o of dizziness  Transfers Overall transfer level: Independent Equipment used: None             General transfer comment: STS with CGA, no episodes of dizziness, use of hands on knees for transfer.  Ambulation/Gait Ambulation/Gait assistance: Supervision;Min guard Gait Distance (Feet): 80 Feet Assistive device: None   Gait velocity: 0.3ms   General Gait Details: Patient declined ambulation in hallway due to CSpencer Ambulated multiple laps in room with narrow BOS, decreased stance phase on LLE, difficulty turning L> R, no need for AD, gait safe and functional for supervision to/from restroom. Relayed to nurse.  Stairs            Wheelchair Mobility    Modified Rankin (Stroke Patients Only)       Balance Overall balance assessment: Needs assistance Sitting-balance support: No upper extremity supported;Feet supported Sitting balance-Leahy Scale: Normal Sitting balance - Comments: able to reach inside/outside BOS without LOB   Standing balance support: No upper extremity supported;During functional activity Standing balance-Leahy Scale: Fair Standing balance comment: able to ambulate and static stand without UE support, turning and head movements result in instability.     Tandem Stance - Right Leg: (modified tandem unable to perform > 5 seconds) Tandem Stance - Left Leg: (unable to perform modified tandem > 6 seconds)     High level balance activites: Direction changes;Sudden stops;Head turns High Level Balance Comments: Patient unsteady with head turns and sudden changes of direction. Noted decrease in stability compared to ~ 1 month ago in  outpatient.             Pertinent Vitals/Pain Pain Assessment: No/denies pain    Home Living Family/patient expects to be discharged to:: Private residence Living Arrangements: Spouse/significant other Available Help at Discharge:  Family;Available 24 hours/day Type of Home: House Home Access: Level entry     Home Layout: One level Home Equipment: Cane - single point Additional Comments: Patient has cane but does not use it.    Prior Function Level of Independence: Independent         Comments: Patient ind. with ADL and iADL, was recently seen in outpatient PT for balance. Occasionally uses cane but rarely.     Hand Dominance        Extremity/Trunk Assessment   Upper Extremity Assessment Upper Extremity Assessment: Defer to OT evaluation    Lower Extremity Assessment Lower Extremity Assessment: Generalized weakness;Overall [facility] for tasks assessed(Grossly 4/5 bilaterally with L hip discomfort against testing. sensation WFL)       Communication   Communication: No difficulties  Cognition Arousal/Alertness: Awake/alert Behavior During Therapy: WFL for tasks assessed/performed Overall Cognitive Status: Within Functional Limits for tasks assessed                                 General Comments: Patient appears to have no changes in cognition since this write has seen him in outpatient therapy ~ 1 month ago.      General Comments General comments (skin integrity, edema, etc.): slight swelling of RLE noted.    Exercises General Exercises - Lower Extremity Hip Flexion/Marching: Strengthening;Both;15 reps;Standing Other Exercises Other Exercises: Patient educated and performed mobiilty and transfers. higher level stability interventions/testing challenging for patient to perform.   Assessment/Plan    PT Assessment Patient needs continued PT services  PT Problem List Decreased strength;Decreased activity tolerance;Decreased balance;Decreased mobility       PT Treatment Interventions Gait training;Stair training;Functional mobility training;Neuromuscular re-education;Balance training;Therapeutic exercise;Therapeutic activities;Patient/family education    PT Goals (Current goals  can be found in the Care Plan section)  Acute Rehab PT Goals Patient Stated Goal: to return home PT Goal Formulation: With patient Time For Goal Achievement: 06/23/19 Potential to Achieve Goals: Fair    Frequency Min 2X/week   Barriers to discharge   none. would benefit from outpatient PT upon d/c for balance    Co-evaluation               AM-PAC PT "6 Clicks" Mobility  Outcome Measure Help needed turning from your back to your side while in a flat bed without using bedrails?: None Help needed moving from lying on your back to sitting on the side of a flat bed without using bedrails?: None Help needed moving to and from a bed to a chair (including a wheelchair)?: None Help needed standing up from a chair using your arms (e.g., wheelchair or bedside chair)?: None Help needed to walk in hospital room?: None Help needed climbing 3-5 steps with a railing? : A Little 6 Click Score: 23    End of Session Equipment Utilized During Treatment: Gait belt Activity Tolerance: Patient tolerated treatment well Patient left: in bed;with call bell/phone within reach;with bed alarm set Nurse Communication: Mobility status;Other (comment)(Patient requesting nurse or charge nurse to discuss obs status.) PT Visit Diagnosis: Unsteadiness on feet (R26.81);Other abnormalities of gait and mobility (R26.89);Muscle weakness (generalized) (M62.81)    Time: 2423-5361 PT Time Calculation (min) (ACUTE ONLY): 16  min   Charges:   PT Evaluation $PT Eval Low Complexity: 1 Low          Janna Arch, PT, DPT    06/09/2019, 9:22 AM

## 2019-06-09 NOTE — Progress Notes (Signed)
*  PRELIMINARY RESULTS* Echocardiogram 2D Echocardiogram has been performed.  Cameron Foster [name] 06/09/2019, 10:10 AM

## 2019-06-10 ENCOUNTER — Telehealth: Payer: Self-pay

## 2019-06-10 DIAGNOSIS — I639 Cerebral infarction, unspecified: Secondary | ICD-10-CM

## 2019-06-10 LAB — CBC
HCT: 36.1 % — ABNORMAL LOW (ref 39.0–52.0)
Hemoglobin: 11.5 g/dL — ABNORMAL LOW (ref 13.0–17.0)
MCH: 29.1 pg (ref 26.0–34.0)
MCHC: 31.9 g/dL (ref 30.0–36.0)
MCV: 91.4 fL (ref 80.0–100.0)
Platelets: 213 10*3/uL (ref 150–400)
RBC: 3.95 MIL/uL — ABNORMAL LOW (ref 4.22–5.81)
RDW: 12.8 % (ref 11.5–15.5)
WBC: 6 10*3/uL (ref 4.0–10.5)
nRBC: 0 % (ref 0.0–0.2)

## 2019-06-10 LAB — BASIC METABOLIC PANEL
Anion gap: 7 (ref 5–15)
BUN: 21 mg/dL (ref 8–23)
CO2: 27 mmol/L (ref 22–32)
Calcium: 9.1 mg/dL (ref 8.9–10.3)
Chloride: 106 mmol/L (ref 98–111)
Creatinine, Ser: 1.3 mg/dL — ABNORMAL HIGH (ref 0.61–1.24)
GFR calc Af Amer: >60 mL/min (ref >60)
GFR calc non Af Amer: 52 mL/min — ABNORMAL LOW (ref >60)
Glucose, Bld: 101 mg/dL — ABNORMAL HIGH (ref 70–99)
Potassium: 3.8 mmol/L (ref 3.5–5.1)
Sodium: 140 mmol/L (ref 135–145)

## 2019-06-10 LAB — MAGNESIUM: Magnesium: 2.2 mg/dL (ref 1.7–2.4)

## 2019-06-10 MED ORDER — OMEPRAZOLE 20 MG PO CPDR: 20.0000 mg | DELAYED_RELEASE_CAPSULE | Freq: Every day | ORAL | 1 refills | Status: DC

## 2019-06-10 MED ORDER — FLUTICASONE PROPIONATE 50 MCG/ACT NA SUSP: 1.0000 | Freq: Every day | NASAL | 3 refills | Status: DC

## 2019-06-10 MED ORDER — MELOXICAM 15 MG PO TABS: 15.0000 mg | ORAL_TABLET | Freq: Every day | ORAL | Status: DC | PRN

## 2019-06-10 MED ORDER — ATORVASTATIN CALCIUM 40 MG PO TABS: 40.0000 mg | ORAL_TABLET | Freq: Every day | ORAL | 2 refills | Status: DC

## 2019-06-10 MED ORDER — DOCUSATE SODIUM 100 MG PO CAPS: 100.0000 mg | ORAL_CAPSULE | Freq: Every day | ORAL | 0 refills | Status: DC

## 2019-06-10 NOTE — Progress Notes (Signed)
SLP Cancellation Note  Patient Details Name: Cameron Foster. MRN: [id number] DOB: [birth year]   Cancelled treatment:       Reason Eval/Treat Not Completed: SLP screened, no needs identified, will sign off(chart reviewed; consulted NSG then met w/ pt).  Pt denied any difficulty swallowing and is currently on a regular diet; tolerates swallowing pills w/ water per NSG. Pt conversed at conversational level w/out overt deficits noted; pt and NSG denied any speech-language deficits.  No further skilled ST services indicated as pt appears at his baseline. Pt agreed. NSG to reconsult if any change in status.      Orinda Kenner, MS, CCC-SLP Royden Bulman 06/10/2019, 10:10 AM

## 2019-06-10 NOTE — Telephone Encounter (Signed)
Copied from [place] [id number]. Topic: General - Other >> Jun 10, 2019  4:00 PM Leward Quan A wrote: Reason for CRM: Patient called to say that the appointment he has on 06/28/19 at 8.20 AM is too early in the morning and that he just got out the hospital after suffering a stroke and 8.20 is too early to be up and out for the appointment. Please call patient at Ph# [phone]

## 2019-06-10 NOTE — Progress Notes (Signed)
Per conversation with MD, pt is ok to drive himself home.

## 2019-06-10 NOTE — Progress Notes (Addendum)
Interim Note   Discussed with Dr. [name] : patient's infarct appears to be a punctate infarction in the motor strip of the right frontal lobe -not secondary to small vessel disease and likely embolic.  As he had a history of paroxysmal atrial fibrillation-he would be a candidate for anticoagulation, however spoke about being cautious starting anticoagulation given history of spontaneous subarachnoid hemorrhage in the posterior fossa.  I did review the notes available on Care Everywhere-work-up included CT angiogram ruling out dissection, aneurysm.  I did not see if they performed a cerebral angiogram.  Also unclear as to etiology for spontaneous subarachnoid hemorrhage--possibly ?Hypertension-patient has chronic microhemorrhages vs ? hemorrhagic conversion of embolic infarction.  MRI brain performed this time did not show any underlying vascular abnormality in the posterior fossa. SAH has resolved.   After discussion with Dr. [name] -she felt that Dr. Manuella Ghazi, his neurologist would be the best to make decision for starting anticoagulation.   As his history of paroxysmal A. fib is remote and he has been in sinus rhythm- I suspect risk of stroke recurrence will be about 2 - 4 %/year.  Echo does not show LV thrombus requiring immediate anticoagulation. Therefore, I do think that while he does have risk of future embolic events, his risk of hemorrhage is also not minimal.  I think the patient can wait to discuss this with his neurologist Dr. Manuella Ghazi if can arrange for a expedited appointment in the next few weeks.    I have been informed patient has been discharged already and Dr. [name] hs informed the patient of the plan.

## 2019-06-10 NOTE — TOC Initial Note (Signed)
Transition of Care (TOC) - Initial/Assessment Note    Patient Details  Name: Cameron Foster. MRN: [id number] Date of Birth: Jul 02, 1940  Transition of Care [facility]) CM/SW Contact:    Su Hilt, RN Phone Number: 06/10/2019, 9:39 AM  Clinical Narrative:      Met with the patient to discuss DC plan and needs He lives at home with his wife has a cane and will not use it, he does not want any DME He is willing to go to Outpatient therapy at Dr Jolaine Artist office referral to be faxed, they will call him for an appointment Will continue to monitor for additional needs              Expected Discharge Plan: OP Rehab Barriers to Discharge: Continued Medical Work up   Patient Goals and CMS Choice Patient states their goals for this hospitalization and ongoing recovery are:: go home      Expected Discharge Plan and Services Expected Discharge Plan: OP Rehab   Discharge Planning Services: CM Consult   Living arrangements for the past 2 months: Single Family Home                 DME Arranged: N/A         HH Arranged: NA          Prior Living Arrangements/Services Living arrangements for the past 2 months: Single Family Home Lives with:: Spouse Patient language and need for interpreter reviewed:: Yes Do you feel safe going back to the place where you live?: Yes      Need for Family Participation in Patient Care: No (Comment) Care giver support system in place?: Yes (comment) Current home services: DME(Cane) Criminal Activity/Legal Involvement Pertinent to Current Situation/Hospitalization: No - Comment as needed  Activities of Daily Living Home Assistive Devices/Equipment: None ADL Screening (condition at time of admission) Patient's cognitive ability adequate to safely complete daily activities?: Yes Is the patient deaf or have difficulty hearing?: No Does the patient have difficulty seeing, even when wearing glasses/contacts?: No Does the patient have difficulty  concentrating, remembering, or making decisions?: No Patient able to express need for assistance with ADLs?: Yes Does the patient have difficulty dressing or bathing?: Yes Independently performs ADLs?: Yes (appropriate for developmental age) Does the patient have difficulty walking or climbing stairs?: No Weakness of Legs: None Weakness of Arms/Hands: None  Permission Sought/Granted   Permission granted to share information with : Yes, Verbal Permission Granted              Emotional Assessment Appearance:: Appears stated age Attitude/Demeanor/Rapport: Engaged Affect (typically observed): Appropriate Orientation: : Oriented to Self, Oriented to Situation, Oriented to Place, Oriented to  Time Alcohol / Substance Use: Not Applicable Psych Involvement: No (comment)  Admission diagnosis:  Dupuytren's contracture [M72.0] Stroke (cerebrum) ([place]) [I63.9] Left hand weakness [R29.898] Acute CVA (cerebrovascular accident) ([place]) [I63.9] Cerebrovascular accident (CVA), unspecified mechanism ([place]) [I63.9] Patient Active Problem List   Diagnosis Date Noted  . Acute CVA (cerebrovascular accident) ([place]) 06/08/2019  . Diplopia 11/07/2018  . Paroxysmal atrial fibrillation ([place]) 11/07/2018  . TIA (transient ischemic attack) 10/20/2018  . SAH (subarachnoid hemorrhage) ([place]) 10/16/2018  . Recurrent sinusitis 10/16/2018  . Low libido 08/30/2018  . Obesity 05/14/2018  . Mixed hyperlipidemia 05/14/2018  . Degeneration of lumbar intervertebral disc 03/22/2018  . Osteoarthritis of hip 03/22/2018  . Trochanteric bursitis of right hip 03/22/2018  . Status post partial colectomy 01/03/2017  . Depression, major, single episode, mild ([place])  01/03/2017  . Vasovagal syncope 01/27/2016  . History of skin cancer 10/28/2015  . Osteoarthritis of right knee 10/01/2015  . Essential hypertension 08/25/2015  . Dyspnea on exertion 08/12/2015  . Erectile dysfunction following radiation therapy 08/04/2015  .  Constipation 08/02/2015  . History of prostate cancer 01/20/2015  . Swelling of right lower extremity 01/20/2015   PCP:  [name], MD Pharmacy:   [id number] [id number]Phone: [phone]: [phone]    Social Determinants of Health (SDOH) Interventions    Readmission Risk Interventions No flowsheet data found.

## 2019-06-10 NOTE — Discharge Summary (Addendum)
Physician Discharge Summary   Cameron Foster.  male DOB: [birth year]  EUM:[id number]  PCP: [name], MD  Admit date: 06/08/2019 Discharge date:   Admitted From: home Disposition:  home CODE STATUS: Full code  Discharge Instructions    Diet - low sodium heart healthy   Complete by: As directed    Discharge instructions   Complete by: As directed    You have had a small stroke.  I have increased your Lipitor to 40 mg daily.  Please follow up with your outpatient neurologist.  Dr. Enzo Bi - -   Increase activity slowly   Complete by: As directed        Hospital Course:  For full details, please see H&P, progress notes, consult notes and ancillary notes.  Briefly,  Cameron Foster.is a 79 y.o.malewith medical history significant forhypertension, paroxysmal atrial fibrillation not on anticoagulation, prior CVA, HLD, who presented to the emergency room at 2:40 PM after waking at 7 AM with left arm weakness and numbness with decreased grip strength.   # Small acute infarct in the motor strip Patient with decreased grip strength left hand otherwise no apparent neurologic deficit.  MRI showed small acute lacunar infarct.  Patient was previously on baby aspirin.  A1c 5.6, LDL89.  TTE showed no atrial shunt.  Carotid US showed Moderate bilateral carotid atherosclerosis.  Pt's home Lipitor was increased to 40 mg daily (was on 20 mg) for aggressivelipid management with target LDL<70.    Per neurology consult, "patient's infarct appears to be a punctate infarction in the motor strip of the right frontal lobe -not secondary to small vessel disease and likely embolic.  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 Manuella Ghazi, so will defer  decision for anticoagulation to Dr. Manuella Ghazi.  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 Patient is currently on aspirin but not on systemic anticoagulation for stroke prevention.  Continued metoprolol.  Will defer decision for ppx anticoagulation to pt's outpatient neurologist Dr. Manuella Ghazi.  Essential hypertension home antihypertensives held to allow permissive hypertension, and resumed after discharge.  Mixed hyperlipidemia Increased home lipitor to 40 mg daily (up from 20)  Left fourth finger extensor problem/drop , possible wrist drop Uncertain whether related to CVA or may be peripheral neuropathy.  Per Neuro, should not keep left fingers chronically in splint since this will likely interfere with rehabilitation and improvement in strength   Discharge Diagnoses:  Principal Problem:   Acute CVA (cerebrovascular accident) [facility]) Active Problems:   Essential hypertension   Mixed hyperlipidemia   Paroxysmal atrial fibrillation [facility])    Discharge Instructions:  Allergies as of 06/10/2019      Reactions   Augmentin [amoxicillin-pot Clavulanate] Other (See Comments)   Abdominal upset Has patient had a PCN reaction causing immediate rash, facial/tongue/throat swelling, SOB or lightheadedness with hypotension: Unknown Has patient had a PCN reaction causing severe rash involving mucus membranes or skin necrosis: Unknown Has patient had a PCN reaction that required hospitalization: Unknown Has patient had a PCN reaction occurring within the last 10 years: Unknown If all of the above answers are "NO", then may proceed with Cephalosporin use.   Formaldehyde Rash   Tape Rash   Pt states he is not allergic to tape.  Medication List    TAKE these medications   amLODipine 10 MG tablet Commonly known as: NORVASC TAKE 1 TABLET BY MOUTH EVERY DAY   aspirin 81 MG EC tablet Take 81 mg by mouth daily.     atorvastatin 40 MG tablet Commonly known as: LIPITOR Take 1 tablet (40 mg total) by mouth daily at 6 PM. What changed:   medication strength  how much to take   docusate sodium 100 MG capsule Commonly known as: Stool Softener Take 1 capsule (100 mg total) by mouth daily. What changed: when to take this   fluticasone 50 MCG/ACT nasal spray Commonly known as: FLONASE Place 1 spray into both nostrils daily. What changed:   when to take this  reasons to take this   furosemide 20 MG tablet Commonly known as: LASIX Take 1 tablet (20 mg total) by mouth daily.   lisinopril 10 MG tablet Commonly known as: ZESTRIL TAKE 1 TABLET BY MOUTH EVERY DAY   meclizine 25 MG tablet Commonly known as: ANTIVERT Take 25 mg by mouth 2 (two) times daily as needed.   meloxicam 15 MG tablet Commonly known as: MOBIC Take 1 tablet (15 mg total) by mouth daily as needed for pain. What changed:   when to take this  reasons to take this   metoprolol succinate 50 MG 24 hr tablet Commonly known as: TOPROL-XL TAKE 1 TABLET (50 MG TOTAL) BY MOUTH DAILY. TAKE WITH OR IMMEDIATELY FOLLOWING A MEAL.   multivitamin with minerals Tabs tablet Take 1 tablet by mouth daily.   omeprazole 20 MG capsule Commonly known as: PRILOSEC Take 1 capsule (20 mg total) by mouth daily. What changed:   when to take this  reasons to take this   sertraline 100 MG tablet Commonly known as: ZOLOFT Take 1 tablet (100 mg total) by mouth daily.   VITAMIN B 12 PO Take 1,000 mcg by mouth daily.       Follow-up Information    Bacigalupo, Dionne Bucy, MD. Schedule an appointment as soon as possible for a visit in 1 week(s).   Specialty: Family Medicine Contact information: [address] [place] [address] [phone]           Allergies  Allergen Reactions  . Augmentin [Amoxicillin-Pot Clavulanate] Other (See Comments)    Abdominal upset Has patient had a PCN reaction causing  immediate rash, facial/tongue/throat swelling, SOB or lightheadedness with hypotension: Unknown Has patient had a PCN reaction causing severe rash involving mucus membranes or skin necrosis: Unknown Has patient had a PCN reaction that required hospitalization: Unknown Has patient had a PCN reaction occurring within the last 10 years: Unknown If all of the above answers are "NO", then may proceed with Cephalosporin use.   . Formaldehyde Rash  . Tape Rash    Pt states he is not allergic to tape.     The results of significant diagnostics from this hospitalization (including imaging, microbiology, ancillary and laboratory) are listed below for reference.   Consultations:   Procedures/Studies: CT HEAD WO CONTRAST  Result Date: 06/08/2019 CLINICAL DATA:  TIA, possible stroke EXAM: CT HEAD WITHOUT CONTRAST TECHNIQUE: Contiguous axial images were obtained from the base of the skull through the vertex without intravenous contrast. COMPARISON:  CT head dated 10/20/2018. MRI brain dated 10/21/2018. FINDINGS: Brain: No evidence of acute infarction, hemorrhage, hydrocephalus, extra-axial collection or mass lesion/mass effect. Subcortical Aydin matter and periventricular small vessel ischemic changes. 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 Vascular: Intracranial  atherosclerosis. Skull: Normal. Negative for fracture or focal lesion. Sinuses/Orbits: The visualized paranasal sinuses are essentially clear. The mastoid air cells are unopacified. Other: None. IMPRESSION: No evidence of acute intracranial abnormality. 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 Small vessel ischemic changes. Electronically Signed   By: Julian Hy M.D.   On: 06/08/2019 15:18   MR Brain Wo Contrast (neuro protocol)  Result Date: 06/08/2019 CLINICAL DATA:  79 year old male with stroke-like symptoms. EXAM: MRI HEAD WITHOUT CONTRAST TECHNIQUE: Multiplanar, multiecho pulse sequences of the brain and  surrounding structures were obtained without intravenous contrast. COMPARISON:  Head CT earlier today. Brain MRI 10/21/2018. FINDINGS: Brain: There is a small cortical and subcortical Grigoryan matter linear area of restricted diffusion at the right motor strip (series 2, image 98 and series 4, image 56). Faint T2 and FLAIR hyperintensity. No associated hemorrhage or mass effect. No other restricted diffusion. Numerous small chronic infarcts in both cerebellar hemispheres. Stable prominent chronic lacunar infarct in the right thalamus. Superimposed dilated perivascular spaces throughout the basal ganglia. Scattered small cerebral Burkitt matter chronic lacunar infarcts. Stable patchy T2 and FLAIR hyperintensity in the pons. Occasional chronic micro hemorrhages were better demonstrated on the prior MRI. No midline shift, mass effect, evidence of mass lesion, ventriculomegaly, extra-axial collection or acute intracranial hemorrhage. Cervicomedullary junction and pituitary are within normal limits. Vascular: Major intracranial vascular flow voids are stable from last year. Dominant left vertebral artery. Mild generalized intracranial artery tortuosity. Skull and upper cervical spine: Stable visible upper cervical spine degeneration. Normal bone marrow signal. Sinuses/Orbits: Stable and negative. Other: Mastoids remain clear. Visible internal auditory structures appear normal. IMPRESSION: 1. Positive for a small acute lacunar infarct at the right motor strip. No associated hemorrhage or mass effect. 2. Advanced underlying chronic small and medium-sized vessel ischemia otherwise stable since last year. Electronically Signed   By: Genevie Ann M.D.   On: 06/08/2019 20:30   US Carotid Bilateral (at [facility] and [facility] only)  Result Date: 06/09/2019 CLINICAL DATA:  Stroke symptoms, hypertension, hyperlipidemia EXAM: BILATERAL CAROTID DUPLEX ULTRASOUND TECHNIQUE: Pearline Cables scale imaging, color Doppler and duplex ultrasound were performed of  bilateral carotid and vertebral arteries in the neck. COMPARISON:  10/10/2018 FINDINGS: Criteria: Quantification of carotid stenosis is based on velocity parameters that correlate the residual internal carotid diameter with NASCET-based stenosis levels, using the diameter of the distal internal carotid lumen as the denominator for stenosis measurement. The following velocity measurements were obtained: RIGHT ICA: 57/17 cm/sec CCA: 64/40 cm/sec SYSTOLIC ICA/CCA RATIO:  0.9 ECA: 75 cm/sec LEFT ICA: 48/12 cm/sec CCA: 34/74 cm/sec SYSTOLIC ICA/CCA RATIO:  0.6 ECA: 121 cm/sec RIGHT CAROTID ARTERY: Moderate heterogeneous and calcified bifurcation atherosclerosis. Despite this, no hemodynamically significant right ICA stenosis, velocity elevation, turbulent flow. Degree of narrowing estimated less than 50% ultrasound criteria. RIGHT VERTEBRAL ARTERY: Small and diminutive. Difficult to visualize. Appears antegrade. LEFT CAROTID ARTERY: Similar bifurcation calcific atherosclerosis. Despite this, no hemodynamically significant left ICA stenosis, velocity elevation, turbulent flow. Degree of narrowing also estimated less than 50%. LEFT VERTEBRAL ARTERY:  Antegrade IMPRESSION: Moderate bilateral carotid atherosclerosis. No hemodynamically significant ICA stenosis. Degree of narrowing less than 50% bilaterally by ultrasound criteria. Patent antegrade vertebral flow bilaterally Electronically Signed   By: Jerilynn Mages.  Shick M.D.   On: 06/09/2019 09:53   ECHOCARDIOGRAM COMPLETE  Result Date: 06/09/2019    ECHOCARDIOGRAM REPORT   Patient Name:   Cameron Foster. Date of Exam: 06/09/2019 Medical Rec #:  [id number]  Height:       68.0 in Accession #:    [id number]         Weight:       219.1 lb Date of Birth:  [birth year]          BSA:          2.125 m Patient Age:    57 years           BP:           130/82 mmHg Patient Gender: M                  HR:           72 bpm. Exam Location:  [facility] Procedure: 2D Echo Indications:     Stroke  434.91/ I163.9  History:         Patient has prior history of Echocardiogram examinations, most                  recent 03/29/2018.  Sonographer:     Arville Go RDCS Referring Phys:  [id number] Athena Masse Diagnosing Phys: Yolonda Kida MD IMPRESSIONS  1. Left ventricular ejection fraction, by estimation, is 55 to 60%. The left ventricle has normal function. The left ventricle has no regional wall motion abnormalities. Left ventricular diastolic parameters were normal.  2. Right ventricular systolic function is normal. The right ventricular size is normal.  3. The mitral valve is normal in structure and function. Trivial mitral valve regurgitation.  4. The aortic valve is normal in structure and function. Aortic valve regurgitation is not visualized. FINDINGS  Left Ventricle: Left ventricular ejection fraction, by estimation, is 55 to 60%. The left ventricle has normal function. The left ventricle has no regional wall motion abnormalities. The left ventricular internal cavity size was normal in size. There is  no left ventricular hypertrophy. Left ventricular diastolic parameters were normal. Right Ventricle: The right ventricular size is normal. No increase in right ventricular wall thickness. Right ventricular systolic function is normal. Left Atrium: Left atrial size was normal in size. Right Atrium: Right atrial size was normal in size. Pericardium: There is no evidence of pericardial effusion. Mitral Valve: The mitral valve is normal in structure and function. Trivial mitral valve regurgitation. Tricuspid Valve: The tricuspid valve is normal in structure. Tricuspid valve regurgitation is mild. Aortic Valve: The aortic valve is normal in structure and function. Aortic valve regurgitation is not visualized. Aortic valve peak gradient measures 5.7 mmHg. Pulmonic Valve: The pulmonic valve was normal in structure. Pulmonic valve regurgitation is not visualized. Aorta: The aortic root is normal in size and  structure. IAS/Shunts: No atrial level shunt detected by color flow Doppler.  LEFT VENTRICLE PLAX 2D LVIDd:         5.49 cm  Diastology LVIDs:         3.91 cm  LV e' lateral:   5.77 cm/s LV PW:         1.30 cm  LV E/e' lateral: 12.0 LV IVS:        1.18 cm  LV e' medial:    6.74 cm/s LVOT diam:     2.10 cm  LV E/e' medial:  10.3 LV SV:         50.57 ml LV SV Index:   23.80 LVOT Area:     3.46 cm  RIGHT VENTRICLE RV Basal diam:  2.86 cm RV S prime:     13.20 cm/s TAPSE (M-mode): 2.1 cm  LEFT ATRIUM           Index       RIGHT ATRIUM           Index LA diam:      4.10 cm 1.93 cm/m  RA Area:     12.80 cm LA Vol (A2C): 59.4 ml 27.96 ml/m RA Volume:   28.90 ml  13.60 ml/m LA Vol (A4C): 9.4 ml  4.43 ml/m  AORTIC VALVE AV Area (Vmax): 2.30 cm AV Vmax:        119.00 cm/s AV Peak Grad:   5.7 mmHg LVOT Vmax:      79.10 cm/s LVOT Vmean:     45.700 cm/s LVOT VTI:       0.146 m  AORTA Ao Root diam: 3.30 cm Ao Asc diam:  3.50 cm MITRAL VALVE               TRICUSPID VALVE MV Area (PHT): 3.31 cm    TV Peak grad:   17.5 mmHg MV Decel Time: 229 msec    TV Vmax:        2.09 m/s MV E velocity: 69.40 cm/s MV A velocity: 52.90 cm/s  SHUNTS MV E/A ratio:  1.31        Systemic VTI:  0.15 m                            Systemic Diam: 2.10 cm Dwayne D Callwood MD Electronically signed by Yolonda Kida MD Signature Date/Time: 06/09/2019/1:10:53 PM    Final       Labs: BNP (last 3 results) No results for input(s): BNP in the last 8760 hours. Basic Metabolic Panel: Recent Labs  Lab 06/08/19 1452 06/10/19 0335  NA 139 140  K 3.6 3.8  CL 106 106  CO2 23 27  GLUCOSE 167* 101*  BUN 21 21  CREATININE 1.39* 1.30*  CALCIUM 9.3 9.1  MG  --  2.2   Liver Function Tests: Recent Labs  Lab 06/08/19 1452  AST 27  ALT 15  ALKPHOS 75  BILITOT 0.8  PROT 7.2  ALBUMIN 3.9   No results for input(s): LIPASE, AMYLASE in the last 168 hours. No results for input(s): AMMONIA in the last 168 hours. CBC: Recent Labs  Lab  06/08/19 1452 06/10/19 0335  WBC 6.9 6.0  NEUTROABS 5.1  --   HGB 12.4* 11.5*  HCT 39.0 36.1*  MCV 91.1 91.4  PLT 239 213   Cardiac Enzymes: No results for input(s): CKTOTAL, CKMB, CKMBINDEX, TROPONINI in the last 168 hours. BNP: Invalid input(s): POCBNP CBG: No results for input(s): GLUCAP in the last 168 hours. D-Dimer No results for input(s): DDIMER in the last 72 hours. Hgb A1c Recent Labs    06/09/19 0527  HGBA1C 5.6   Lipid Profile Recent Labs    06/09/19 0527  CHOL 158  HDL 36*  LDLCALC 89  TRIG 165*  CHOLHDL 4.4   Thyroid function studies No results for input(s): TSH, T4TOTAL, T3FREE, THYROIDAB in the last 72 hours.  Invalid input(s): FREET3 Anemia work up No results for input(s): VITAMINB12, FOLATE, FERRITIN, TIBC, IRON, RETICCTPCT in the last 72 hours. Urinalysis    Component Value Date/Time   COLORURINE YELLOW (A) 06/08/2019 1738   APPEARANCEUR CLEAR (A) 06/08/2019 1738   APPEARANCEUR Clear 08/28/2017 0000   LABSPEC 1.015 06/08/2019 1738   PHURINE 5.0 06/08/2019 1738   GLUCOSEU NEGATIVE 06/08/2019 1738   HGBUR  NEGATIVE 06/08/2019 [place] 06/08/2019 1738   BILIRUBINUR Negative 08/28/2017 0000   KETONESUR NEGATIVE 06/08/2019 1738   PROTEINUR NEGATIVE 06/08/2019 1738   UROBILINOGEN 0.2 04/19/2017 1559   NITRITE NEGATIVE 06/08/2019 1738   LEUKOCYTESUR NEGATIVE 06/08/2019 1738   Sepsis Labs Invalid input(s): PROCALCITONIN,  WBC,  LACTICIDVEN Microbiology Recent Results (from the past 240 hour(s))  SARS CORONAVIRUS 2 (TAT 6-24 HRS) Nasopharyngeal Nasopharyngeal Swab     Status: None   Collection Time: 06/08/19 10:06 PM   Specimen: Nasopharyngeal Swab  Result Value Ref Range Status   SARS Coronavirus 2 NEGATIVE NEGATIVE Final    Comment: (NOTE) SARS-CoV-2 target nucleic acids are NOT DETECTED. The SARS-CoV-2 RNA is generally detectable in upper and lower respiratory specimens during the acute phase of infection.  Negative results do not preclude SARS-CoV-2 infection, do not rule out co-infections with other pathogens, and should not be used as the sole basis for treatment or other patient management decisions. Negative results must be combined with clinical observations, patient history, and epidemiological information. The expected result is Negative. Fact Sheet for Patients: SugarRoll.be Fact Sheet for Healthcare Providers: https://www.woods-mathews.com/ This test is not yet approved or cleared by the [place] FDA and  has been authorized for detection and/or diagnosis of SARS-CoV-2 by FDA under an Emergency Use Authorization (EUA). This EUA will remain  in effect (meaning this test can be used) for the duration of the COVID-19 declaration under Section 56 4(b)(1) of the Act, 21 U.S.C. section 360bbb-3(b)(1), unless the authorization is terminated or revoked sooner. Performed at [facility], [place] [address], [place], [place] [address]      Total time spend on discharging this patient, including the last patient exam, discussing the hospital stay, instructions for ongoing care as it relates to all pertinent caregivers, as well as preparing the medical discharge records, prescriptions, and/or referrals as applicable, is 30 minutes.    Enzo Bi, MD  [facility] 06/10/2019, 9:41 AM  If 7PM-7AM, please contact night-coverage

## 2019-06-10 NOTE — Evaluation (Signed)
Occupational Therapy Evaluation Patient Details Name: Cameron Foster. MRN: [id number] DOB: 19-Jun-1940 Today's Date: 06/10/2019    History of Present Illness Patient is a pleasant 79 year old male with PMH of HTN, Paroxysmal A fib not on anticoagulation, CVA, HLD, TIA, diplopia,  arthrtisis, depression, alcoholism, GERD, prostate cancer with radiation tx, and vasovagal syncope. Presented to ED with left arm weakness and numbness with decreased grip strength. Denied LE involvement. MRI showed small acute lunar infarct the right motor strip CT showed old right thalaamic and bilateral cerebellar infarcts. TPA not given due to outside of window.   Clinical Impression   Pt is 79 year old male who presents with new onset of small acute lunar infarct on R motor strip (see above for presenting problems).  Pt was active and living at home with his wife and presents with LUE and hand weakness with intact sensation.  Pt is R hand dominant and is able to move L UE and hand but has decreased coordination and strength for pinch and some residual numbness in fingers.  Given red theraputty with written HEP which were reviewed and emphasized extension with fingers and not as much flexion if tightness or increased tone occurs (not currently present).  He requires extra time to complete ADLs due to decreased control of L hand and stiffnes in R knee but is able to complete with no assist or cues,  Rec continued OT while in hospital to continue to work on increasing fine motor control, strengthening and coordination exercises and family ed and training and outpatient OT after discharge. Pt states he was receiving outpatient services at [facility] [address] location.    Follow Up Recommendations  Outpatient OT    Equipment Recommendations  Other (comment)(pt given red theraputty with written HEP)    Recommendations for Other Services       Precautions / Restrictions Precautions Precautions:  None Precaution Comments: Mod fall risk Restrictions Weight Bearing Restrictions: No      Mobility Bed Mobility                  Transfers                      Balance                                           ADL either performed or assessed with clinical judgement   ADL Overall ADL's : Needs assistance/impaired                                       General ADL Comments: Pt is able to complete ADLs with supervision but needs extra time for LUE and hand to help RUE and hand and min assist with buttons and zippers. Indep in bed mobility and sitting at EOB with good balance. Stiffness in RLE at knee and rec dressing R side first but was able to reach feet and complete socks, shoes and pants without any assist.     Vision Patient Visual Report: No change from baseline       Perception     Praxis      Pertinent Vitals/Pain       Hand Dominance Right   Extremity/Trunk Assessment Upper Extremity Assessment Upper Extremity  Assessment: LUE deficits/detail LUE Deficits / Details: pt has full AROM of LUE and hand but movements are slow and not coordinated and sensation intact. LUE Sensation: WNL LUE Coordination: decreased fine motor;decreased gross motor           Communication Communication Communication: No difficulties   Cognition Arousal/Alertness: Awake/alert Behavior During Therapy: WFL for tasks assessed/performed Overall Cognitive Status: Within Functional Limits for tasks assessed                                     General Comments       Exercises     Shoulder Instructions      Home Living Family/patient expects to be discharged to:: Private residence Living Arrangements: Spouse/significant other Available Help at Discharge: Family;Available 24 hours/day Type of Home: House Home Access: Level entry     Home Layout: One level     Bathroom Shower/Tub: Medical illustrator: Handicapped height Bathroom Accessibility: Yes How Accessible: Accessible via walker Home Equipment: [place] - single point;Shower seat   Additional Comments: Patient has cane but does not use it.      Prior Functioning/Environment Level of Independence: Independent        Comments: Patient ind. with ADL and iADL, was recently seen in outpatient PT for balance. Occasionally uses cane but rarely.        OT Problem List: Decreased strength;Impaired UE functional use;Decreased coordination      OT Treatment/Interventions: Therapeutic exercise;Neuromuscular education;Patient/family education    OT Goals(Current goals can be found in the care plan section) Acute Rehab OT Goals Patient Stated Goal: to return home OT Goal Formulation: With patient Time For Goal Achievement: 06/17/19 Potential to Achieve Goals: Good ADL Goals Pt/caregiver will Perform Home Exercise Program: Increased strength;Left upper extremity;With theraputty;With written HEP provided;Independently  OT Frequency: Min 1X/week   Barriers to D/C:            Co-evaluation              AM-PAC OT "6 Clicks" Daily Activity     Outcome Measure Help from another person eating meals?: None Help from another person taking care of personal grooming?: None Help from another person toileting, which includes using toliet, bedpan, or urinal?: None Help from another person bathing (including washing, rinsing, drying)?: None Help from another person to put on and taking off regular upper body clothing?: A Little Help from another person to put on and taking off regular lower body clothing?: None 6 Click Score: 23   End of Session    Activity Tolerance: Patient tolerated treatment well Patient left: in bed;with nursing/sitter in room(NSG Jennifer in room with pt at EOB)  OT Visit Diagnosis: Other symptoms and signs involving the nervous system (R29.898);Hemiplegia and hemiparesis Hemiplegia -  Right/Left: Left Hemiplegia - dominant/non-dominant: Non-Dominant Hemiplegia - caused by: Cerebral infarction                Time: 4166-0630 OT Time Calculation (min): 25 min Charges:  OT General Charges $OT Visit: 1 Visit OT Evaluation $OT Eval Low Complexity: 1 Low OT Treatments $Neuromuscular Re-education: 8-22 mins  Chrys Racer, OTR/L, [place] ascom [phone] 06/10/19, 10:35 AM

## 2019-06-11 ENCOUNTER — Telehealth: Payer: Self-pay

## 2019-06-11 DIAGNOSIS — Z85828 Personal history of other malignant neoplasm of skin: Secondary | ICD-10-CM | POA: Diagnosis not present

## 2019-06-11 DIAGNOSIS — L299 Pruritus, unspecified: Secondary | ICD-10-CM | POA: Diagnosis not present

## 2019-06-11 DIAGNOSIS — L853 Xerosis cutis: Secondary | ICD-10-CM | POA: Diagnosis not present

## 2019-06-11 DIAGNOSIS — L821 Other seborrheic keratosis: Secondary | ICD-10-CM | POA: Diagnosis not present

## 2019-06-11 DIAGNOSIS — D0461 Carcinoma in situ of skin of right upper limb, including shoulder: Secondary | ICD-10-CM | POA: Diagnosis not present

## 2019-06-11 DIAGNOSIS — C44619 Basal cell carcinoma of skin of left upper limb, including shoulder: Secondary | ICD-10-CM | POA: Diagnosis not present

## 2019-06-11 DIAGNOSIS — L820 Inflamed seborrheic keratosis: Secondary | ICD-10-CM | POA: Diagnosis not present

## 2019-06-11 NOTE — Telephone Encounter (Signed)
Transition Care Management Follow-up Telephone Call  Date of discharge and from where: [facility] on 06/10/19  How have you been since you were released from the hospital? Doing fine, still has weakness in his left arm but not as bad as previously. Declines any other s/s.  Any questions or concerns? No   Items Reviewed:  Did the pt receive and understand the discharge instructions provided? Yes   Medications obtained and verified? Yes   Any new allergies since your discharge? No   Dietary orders reviewed? Yes  Do you have support at home? Yes   Other (ie: DME, Home Health, etc) PT was ordered however pt declined.  Functional Questionnaire: (I = Independent and D = Dependent)  Bathing/Dressing- I   Meal Prep- I  Eating- I  Maintaining continence- I  Transferring/Ambulation- I  Managing Meds- I   Follow up appointments reviewed:    PCP Hospital f/u appt confirmed? Yes  Scheduled to see Dr Brita Romp on 07/01/19 @ 1:20 PM.  [facility] f/u appt confirmed? N/A   Are transportation arrangements needed? No   If their condition worsens, is the pt aware to call  their PCP or go to the ED? Yes  Was the patient provided with contact information for the PCP's office or ED? Yes  Was the pt encouraged to call back with questions or concerns? Yes

## 2019-06-11 NOTE — Telephone Encounter (Signed)
HFU scheduled for 07/01/19 @ 1:20 PM.

## 2019-06-17 ENCOUNTER — Other Ambulatory Visit: Payer: Self-pay

## 2019-06-17 ENCOUNTER — Ambulatory Visit (INDEPENDENT_AMBULATORY_CARE_PROVIDER_SITE_OTHER): Payer: Medicare Other | Admitting: Internal Medicine

## 2019-06-17 ENCOUNTER — Encounter: Payer: Self-pay | Admitting: Internal Medicine

## 2019-06-17 ENCOUNTER — Ambulatory Visit (INDEPENDENT_AMBULATORY_CARE_PROVIDER_SITE_OTHER): Payer: Medicare Other

## 2019-06-17 VITALS — BP 110/80 | HR 74 | Ht 68.0 in | Wt 225.2 lb

## 2019-06-17 DIAGNOSIS — I10 Essential (primary) hypertension: Secondary | ICD-10-CM | POA: Diagnosis not present

## 2019-06-17 DIAGNOSIS — I639 Cerebral infarction, unspecified: Secondary | ICD-10-CM | POA: Diagnosis not present

## 2019-06-17 DIAGNOSIS — I480 Paroxysmal atrial fibrillation: Secondary | ICD-10-CM | POA: Diagnosis not present

## 2019-06-17 DIAGNOSIS — E785 Hyperlipidemia, unspecified: Secondary | ICD-10-CM | POA: Diagnosis not present

## 2019-06-17 DIAGNOSIS — R0600 Dyspnea, unspecified: Secondary | ICD-10-CM | POA: Diagnosis not present

## 2019-06-17 DIAGNOSIS — R0609 Other forms of dyspnea: Secondary | ICD-10-CM

## 2019-06-17 NOTE — Progress Notes (Signed)
Follow-up Outpatient Visit Date: 06/17/2019  Primary Care Provider: [name], [address] [place] [address]  Chief Complaint: Stroke  HPI:  Mr. Sangiovanni is a 79 y.o. male with history of hypertension, hyperlipidemia, single episode of atrial fibrillation in 1993/06/29, subarachnoid hemorrhage of uncertain etiology, prostate cancer, and alcohol abuse, who presents for follow-up of shortness of breath.  I last saw Mr. Riner in 10/2018, at which time he reported stable exertional dyspnea present for at least 2 years.  He wondered if chronic sinus issues could be contributing to his dyspnea and recent vision problems; he was scheduled to follow-up with the ENT the day after our visit.  We discussed cardiac CTA versus catheterization to further evaluate his symptoms but agreed to defer this given his recent intracerebral hemorrhage.  He was hospitalized last month with acute left hand weakness and was found to have a punctate acute infarct involving the right motor strip.  Today, Mr. Rumery reports that he feels back to normal.  His left hand weakness and previous diplopia (occurred last summer in the setting of [place])) have resolved.  His chronic exertional dyspnea is stable to slightly better.  He denies chest pain, palpitations, lightheadedness, and orthopnea.  Chronic right calf/ankle swelling is unchanged from baseline.  His main limiting factor is pain involving the low back and right hip.  --------------------------------------------------------------------------------------------------  Past Medical History:  Diagnosis Date  . Alcoholism ([place])   . Arthritis   . Atrial fibrillation ([place])    one episode  . Colon polyp   . Depression    Son died 2014-06-29  . Diverticulitis   . Diverticulosis 30 years  . Dysrhythmia    At Fib 1995  . GERD (gastroesophageal reflux disease)   . Hyperlipidemia   . Hypertension   . Irritable bowel syndrome   . Prostate cancer ([place]) 2010/06/29     treated with radiation therapy. Prostate  . Vasovagal syncope    Past Surgical History:  Procedure Laterality Date  . [place] no stents  . CATARACT EXTRACTION, BILATERAL    . COLON RESECTION SIGMOID N/A 12/07/2016   Procedure: COLON RESECTION SIGMOID;  Surgeon: Clayburn Pert, MD;  Location: [facility] ORS;  Service: General;  Laterality: N/A;  . COLON SURGERY  11/2016   Colostomy  . COLONOSCOPY  06-29-14  . COLONOSCOPY WITH PROPOFOL N/A 05/30/2016   Procedure: COLONOSCOPY WITH PROPOFOL;  Surgeon: Lollie Sails, MD;  Location: [facility];  Service: Endoscopy;  Laterality: N/A;  . COLOSTOMY Left 12/07/2016   Procedure: COLOSTOMY;  Surgeon: Clayburn Pert, MD;  Location: [facility] ORS;  Service: General;  Laterality: Left;  . COLOSTOMY REVERSAL N/A 03/21/2017   Procedure: COLOSTOMY REVERSAL;  Surgeon: Clayburn Pert, MD;  Location: [facility] ORS;  Service: General;  Laterality: N/A;  . COLOSTOMY TAKEDOWN N/A 03/21/2017   Procedure: LAPAROSCOPIC COLOSTOMY TAKEDOWN;  Surgeon: Clayburn Pert, MD;  Location: [facility] ORS;  Service: General;  Laterality: N/A;  . CYSTOSCOPY WITH STENT PLACEMENT Bilateral 03/21/2017   Procedure: CYSTOSCOPY WITH LIGHTED STENT PLACEMENT;  Surgeon: Abbie Sons, MD;  Location: [facility] ORS;  Service: Urology;  Laterality: Bilateral;  . ESOPHAGOGASTRODUODENOSCOPY    . ESOPHAGOGASTRODUODENOSCOPY N/A 05/30/2016   Procedure: ESOPHAGOGASTRODUODENOSCOPY (EGD);  Surgeon: Lollie Sails, MD;  Location: [facility] ENDOSCOPY;  Service: Endoscopy;  Laterality: N/A;  . EYE SURGERY     cataracts  . FLEXIBLE SIGMOIDOSCOPY N/A 03/21/2017   Procedure: FLEXIBLE SIGMOIDOSCOPY;  Surgeon: Clayburn Pert, MD;  Location: [facility] ORS;  Service: General;  Laterality: N/A;  . FRACTURE SURGERY Bilateral    right arm and left wrist  . INCISION AND DRAINAGE ABSCESS N/A 12/07/2016   Procedure: DRAINAGE  OF INTRA ABDOMINAL ABSCESS;  Surgeon: Clayburn Pert, MD;   Location: [facility] ORS;  Service: General;  Laterality: N/A;  . KNEE ARTHROSCOPY    . LAPAROTOMY N/A 12/07/2016   Procedure: EXPLORATORY LAPAROTOMY;  Surgeon: Clayburn Pert, MD;  Location: [facility] ORS;  Service: General;  Laterality: N/A;  . PROSTATE SURGERY     Microwave therapy  . TONSILLECTOMY    . TOTAL KNEE ARTHROPLASTY Right 07/27/2016   Procedure: TOTAL KNEE ARTHROPLASTY;  Surgeon: [name], MD;  Location: [facility] ORS;  Service: Orthopedics;  Laterality: Right;  Dr. Erlene Quan had to place Urinary catheter due to prostate cancer history.  Using flexible scope.     Recent CV Pertinent Labs: Lab Results  Component Value Date   CHOL 158 06/09/2019   CHOL 213 (H) 05/18/2018   HDL 36 (L) 06/09/2019   HDL 43 05/18/2018   LDLCALC 89 06/09/2019   LDLCALC 141 (H) 05/18/2018   TRIG 165 (H) 06/09/2019   CHOLHDL 4.4 06/09/2019   INR 1.0 06/08/2019   BNP 117.3 (H) 08/12/2015   K 3.8 06/10/2019   MG 2.2 06/10/2019   BUN 21 06/10/2019   BUN 17 05/15/2019   CREATININE 1.30 (H) 06/10/2019   CREATININE 1.40 (H) 08/12/2015    Past medical and surgical history were reviewed and updated in EPIC.  Current Meds  Medication Sig  . amLODipine (NORVASC) 10 MG tablet TAKE 1 TABLET BY MOUTH EVERY DAY  . aspirin 81 MG EC tablet Take 81 mg by mouth daily.   Marland Kitchen atorvastatin (LIPITOR) 40 MG tablet Take 1 tablet (40 mg total) by mouth daily at 6 PM. (Patient taking differently: Take 40 mg by mouth daily. )  . Cyanocobalamin (VITAMIN B 12 PO) Take 1,000 mcg by mouth daily.  Marland Kitchen docusate sodium (STOOL SOFTENER) 100 MG capsule Take 1 capsule (100 mg total) by mouth daily.  . fluticasone (FLONASE) 50 MCG/ACT nasal spray Place 1 spray into both nostrils daily.  . furosemide (LASIX) 20 MG tablet Take 1 tablet (20 mg total) by mouth daily.  Marland Kitchen lisinopril (ZESTRIL) 10 MG tablet TAKE 1 TABLET BY MOUTH EVERY DAY  . meclizine (ANTIVERT) 25 MG tablet Take 25 mg by mouth 2 (two) times daily as needed.  . meloxicam  (MOBIC) 15 MG tablet Take 1 tablet (15 mg total) by mouth daily as needed for pain.  . metoprolol succinate (TOPROL-XL) 50 MG 24 hr tablet TAKE 1 TABLET (50 MG TOTAL) BY MOUTH DAILY. TAKE WITH OR IMMEDIATELY FOLLOWING A MEAL.  . Multiple Vitamin (MULTIVITAMIN WITH MINERALS) TABS tablet Take 1 tablet by mouth daily.  Marland Kitchen omeprazole (PRILOSEC) 20 MG capsule Take 1 capsule (20 mg total) by mouth daily.  . sertraline (ZOLOFT) 100 MG tablet Take 1 tablet (100 mg total) by mouth daily.    Allergies: Augmentin [amoxicillin-pot clavulanate], Formaldehyde, and Tape  Social History   Tobacco Use  . Smoking status: Former Smoker    Packs/day: 1.00    Years: 27.00    Pack years: 27.00    Types: Cigarettes    Quit date: 04/18/1988    Years since quitting: 31.1  . Smokeless tobacco: Former Systems developer    Types: Chew  Substance Use Topics  . Alcohol use: Yes    Alcohol/week: 3.0 standard drinks    Types:  3 Standard drinks or equivalent per week    Comment: Occasional- Former Heavy ETOH Use  . Drug use: No    Family History  Problem Relation Age of Onset  . Lung cancer Father        smoker  . Other Mother   . Sudden death Son        due to "Blood clots"  . Bipolar disorder Son   . Kidney disease Daughter        congenital one small kidney  . Prostate cancer Neg Hx   . Bladder Cancer Neg Hx     Review of Systems: A 12-system review of systems was performed and was negative except as noted in the HPI.  --------------------------------------------------------------------------------------------------  Physical Exam: BP 110/80 (BP Location: Left Arm, Patient Position: Sitting, Cuff Size: Normal)   Pulse 74   Ht 5' 8"  (1.727 m)   Wt 225 lb 4 oz (102.2 kg)   SpO2 98%   BMI 34.25 kg/m   General:  NAD. HEENT: No conjunctival pallor or scleral icterus. Facemask in place. Neck: No JVD or HJR. Lungs: Normal work of breathing. Clear to auscultation bilaterally without wheezes or  crackles. Heart: Regular rate and rhythm without murmurs, rubs, or gallops. Abd: Bowel sounds present. Soft, NT/ND without hepatosplenomegaly Ext: No lower extremity edema.  EKG:  NSR with PAC's, LAFB, borderline LVH, and nonspecific T wave changes.  Lab Results  Component Value Date   WBC 6.0 06/10/2019   HGB 11.5 (L) 06/10/2019   HCT 36.1 (L) 06/10/2019   MCV 91.4 06/10/2019   PLT 213 06/10/2019    Lab Results  Component Value Date   NA 140 06/10/2019   K 3.8 06/10/2019   CL 106 06/10/2019   CO2 27 06/10/2019   BUN 21 06/10/2019   CREATININE 1.30 (H) 06/10/2019   GLUCOSE 101 (H) 06/10/2019   ALT 15 06/08/2019    Lab Results  Component Value Date   CHOL 158 06/09/2019   HDL 36 (L) 06/09/2019   LDLCALC 89 06/09/2019   TRIG 165 (H) 06/09/2019   CHOLHDL 4.4 06/09/2019    --------------------------------------------------------------------------------------------------  ASSESSMENT AND PLAN: Stroke and paroxysmal atrial fibrillation: Mr. Clauson reports resolution of vision changes in the setting of SAH this summer and more recent left-sided weakness from acute ischemic stroke.  He has only had one documented episode of atrial fibrillation in 1914, though he is certainly at risk for recurrent atrial fibrillation.  His CHADSVASC score is at least 4 (stroke x 2, age, and hypertension).  However, given spontaneous subarachanoid hemorrhage, I am reluctant to initiate anticoagulation at this time; I would appreciate input from Dr. Manuella Ghazi (neurology) regarding risks and benefits of anticoagulation moving forward.  Aspirin 81 mg daily should be continued for now.  We have also agreed to perform a 14-day event monitor to evaluation for clinically silent PAF.  If we were to find PAF and anticoagulation is not a safe option, we will need to consider referral to [facility] for consideration of Watchman or Lariat procedure.  Dyspnea on exertion: Stable to slightly better compared with prior  visits.  No further workup or intervention is planned at this time.  Hypertension: Blood pressure is reasonably well-controlled today.  No medication changes at this time.  Hyperlipidemia: Continue atorvastatin 40 mg daily for target LDL < 70, given history of ischemic stroke.  Follow-up: Return to clinic in 6 weeks.  [name], MD 06/17/2019 2:59 PM

## 2019-06-17 NOTE — Patient Instructions (Signed)
Medication Instructions:  Your physician recommends that you continue on your current medications as directed. Please refer to the Current Medication list given to you today.  *If you need a refill on your cardiac medications before your next appointment, please call your pharmacy*  Lab Work: none If you have labs (blood work) drawn today and your tests are completely normal, you will receive your results only by: Marland Kitchen MyChart Message (if you have MyChart) OR . A paper copy in the mail If you have any lab test that is abnormal or we need to change your treatment, we will call you to review the results.  Testing/Procedures: Your physician has recommended that you wear a 14 days Zio monitor. This monitor is a medical device that records the heart's electrical activity. Doctors most often use these monitors to diagnose arrhythmias. Arrhythmias are problems with the speed or rhythm of the heartbeat. The monitor is a small device applied to your chest. You can wear one while you do your normal daily activities. While wearing this monitor if you have any symptoms to push the button and record what you felt. Once you have worn this monitor for the period of time provider prescribed (Usually 14 days), you will return the monitor device in the postage paid box. Once it is returned they will download the data collected and provide [place] with a report which the provider will then review and we will call you with those results. Important tips:  1. Avoid showering during the first 24 hours of wearing the monitor. 2. Avoid excessive sweating to help maximize wear time. 3. Do not submerge the device, no hot tubs, and no swimming pools. 4. Keep any lotions or oils away from the patch. 5. After 24 hours you may shower with the patch on. Take brief showers with your back facing the shower head.  6. Do not remove patch once it has been placed because that will interrupt data and decrease adhesive wear time. 7. Push the  button when you have any symptoms and write down what you were feeling. 8. Once you have completed wearing your monitor, remove and place into box which has postage paid and place in your outgoing mailbox.  9. If for some reason you have misplaced your box then call our office and we can provide another box and/or mail it off for you.  Follow-Up: At [facility], you and your health needs are our priority.  As part of our continuing mission to provide you with exceptional heart care, we have created designated Provider Care Teams.  These Care Teams include your primary Cardiologist (physician) and Advanced Practice Providers (APPs -  Physician Assistants and Nurse Practitioners) who all work together to provide you with the care you need, when you need it.  We recommend signing up for the patient portal called "MyChart".  Sign up information is provided on this After Visit Summary.  MyChart is used to connect with patients for Virtual Visits (Telemedicine).  Patients are able to view lab/test results, encounter notes, upcoming appointments, etc.  Non-urgent messages can be sent to your provider as well.   To learn more about what you can do with MyChart, go to [email or web address].    Your next appointment:   6 week(s)  The format for your next appointment:   In Person  Provider:    You may see DR Harrell Gave END or one of the following Advanced Practice Providers on your designated Care Team:    Murray Hodgkins,  NP  Christell Faith, PA-C  Marrianne Mood, PA-C

## 2019-06-18 ENCOUNTER — Encounter: Payer: Self-pay | Admitting: Internal Medicine

## 2019-06-19 ENCOUNTER — Other Ambulatory Visit: Payer: Self-pay | Admitting: Internal Medicine

## 2019-06-27 ENCOUNTER — Ambulatory Visit: Payer: Self-pay | Admitting: Family Medicine

## 2019-06-27 DIAGNOSIS — I6381 Other cerebral infarction due to occlusion or stenosis of small artery: Secondary | ICD-10-CM | POA: Diagnosis not present

## 2019-06-27 DIAGNOSIS — H532 Diplopia: Secondary | ICD-10-CM | POA: Diagnosis not present

## 2019-06-27 DIAGNOSIS — R4189 Other symptoms and signs involving cognitive functions and awareness: Secondary | ICD-10-CM | POA: Diagnosis not present

## 2019-06-27 DIAGNOSIS — Z8673 Personal history of transient ischemic attack (TIA), and cerebral infarction without residual deficits: Secondary | ICD-10-CM | POA: Diagnosis not present

## 2019-06-27 DIAGNOSIS — G4733 Obstructive sleep apnea (adult) (pediatric): Secondary | ICD-10-CM | POA: Diagnosis not present

## 2019-06-27 DIAGNOSIS — R2689 Other abnormalities of gait and mobility: Secondary | ICD-10-CM | POA: Diagnosis not present

## 2019-06-28 ENCOUNTER — Inpatient Hospital Stay: Payer: Medicare Other | Admitting: Family Medicine

## 2019-07-01 ENCOUNTER — Ambulatory Visit (INDEPENDENT_AMBULATORY_CARE_PROVIDER_SITE_OTHER): Payer: Medicare Other | Admitting: Family Medicine

## 2019-07-01 ENCOUNTER — Other Ambulatory Visit: Payer: Self-pay

## 2019-07-01 VITALS — BP 107/70 | HR 76 | Temp 96.9°F | Resp 16 | Wt 222.0 lb

## 2019-07-01 DIAGNOSIS — I480 Paroxysmal atrial fibrillation: Secondary | ICD-10-CM | POA: Diagnosis not present

## 2019-07-01 DIAGNOSIS — E669 Obesity, unspecified: Secondary | ICD-10-CM | POA: Diagnosis not present

## 2019-07-01 DIAGNOSIS — Z6833 Body mass index (BMI) 33.0-33.9, adult: Secondary | ICD-10-CM | POA: Diagnosis not present

## 2019-07-01 DIAGNOSIS — E782 Mixed hyperlipidemia: Secondary | ICD-10-CM | POA: Diagnosis not present

## 2019-07-01 DIAGNOSIS — Z8673 Personal history of transient ischemic attack (TIA), and cerebral infarction without residual deficits: Secondary | ICD-10-CM | POA: Insufficient documentation

## 2019-07-01 DIAGNOSIS — Z8679 Personal history of other diseases of the circulatory system: Secondary | ICD-10-CM | POA: Diagnosis not present

## 2019-07-01 DIAGNOSIS — I10 Essential (primary) hypertension: Secondary | ICD-10-CM | POA: Diagnosis not present

## 2019-07-01 NOTE — Assessment & Plan Note (Signed)
Well controlled Continue current medications Recheck metabolic panel F/u in 6 months  

## 2019-07-01 NOTE — Assessment & Plan Note (Signed)
Followed by Neuro In 09/2018 Avoid anticoag as this was spontaneous

## 2019-07-01 NOTE — Assessment & Plan Note (Signed)
Back to neuro baseline Continue to follow with neurology Continue lipitor and ASA

## 2019-07-01 NOTE — Progress Notes (Signed)
Patient: Cameron Foster. Male    DOB: 1940-12-30   79 y.o.   MRN: [id number] Visit Date: 07/01/2019  Today's Provider: Lavon Paganini, MD   Chief Complaint  Patient presents with  . Follow-up   Subjective:     HPI  Follow up Hospitalization  Patient was admitted to [facility] on 06/08/2019 and discharged on 06/10/2019. He was treated for left hand weakness and numbness with decreased grip strength. Treatment for this included MRI. Patient advised to increase Lipitor to 19m daily. Telephone follow up was done on 06/10/2019 He reports excellent compliance with treatment. He reports this condition is Improved. [facility] to f/u on acute lacunar infarct.  ------------------------------------------------------------------------------------    Allergies  Allergen Reactions  . Augmentin [Amoxicillin-Pot Clavulanate] Other (See Comments)    Abdominal upset Has patient had a PCN reaction causing immediate rash, facial/tongue/throat swelling, SOB or lightheadedness with hypotension: Unknown Has patient had a PCN reaction causing severe rash involving mucus membranes or skin necrosis: Unknown Has patient had a PCN reaction that required hospitalization: Unknown Has patient had a PCN reaction occurring within the last 10 years: Unknown If all of the above answers are "NO", then may proceed with Cephalosporin use.   . Formaldehyde Rash  . Tape Rash    Pt states he is not allergic to tape.     Current Outpatient Medications:  .  amLODipine (NORVASC) 10 MG tablet, TAKE 1 TABLET BY MOUTH EVERY DAY, Disp: 90 tablet, Rfl: 1 .  aspirin 81 MG EC tablet, Take 81 mg by mouth daily. , Disp: , Rfl:  .  atorvastatin (LIPITOR) 40 MG tablet, Take 1 tablet (40 mg total) by mouth daily at 6 PM. (Patient taking differently: Take 40 mg by mouth daily. ), Disp: 30 tablet, Rfl: 2 .  docusate sodium (STOOL SOFTENER) 100 MG capsule, Take 1  capsule (100 mg total) by mouth daily., Disp: 10 capsule, Rfl: 0 .  fluticasone (FLONASE) 50 MCG/ACT nasal spray, Place 1 spray into both nostrils daily., Disp: 16 g, Rfl: 3 .  furosemide (LASIX) 20 MG tablet, TAKE 1 TABLET BY MOUTH EVERY DAY, Disp: 30 tablet, Rfl: 1 .  lisinopril (ZESTRIL) 10 MG tablet, TAKE 1 TABLET BY MOUTH EVERY DAY, Disp: 90 tablet, Rfl: 1 .  meclizine (ANTIVERT) 25 MG tablet, Take 25 mg by mouth 2 (two) times daily as needed., Disp: , Rfl:  .  meloxicam (MOBIC) 15 MG tablet, Take 1 tablet (15 mg total) by mouth daily as needed for pain., Disp: , Rfl:  .  metoprolol succinate (TOPROL-XL) 50 MG 24 hr tablet, TAKE 1 TABLET (50 MG TOTAL) BY MOUTH DAILY. TAKE WITH OR IMMEDIATELY FOLLOWING A MEAL., Disp: 90 tablet, Rfl: 1 .  Multiple Vitamin (MULTIVITAMIN WITH MINERALS) TABS tablet, Take 1 tablet by mouth daily., Disp: 30 tablet, Rfl: 0 .  omeprazole (PRILOSEC) 20 MG capsule, Take 1 capsule (20 mg total) by mouth daily., Disp: 90 capsule, Rfl: 1 .  sertraline (ZOLOFT) 100 MG tablet, Take 1 tablet (100 mg total) by mouth daily., Disp: 90 tablet, Rfl: 3 .  Cyanocobalamin (VITAMIN B 12 PO), Take 1,000 mcg by mouth daily., Disp: , Rfl:   Review of Systems  Constitutional: Negative for chills, fatigue and fever.  Respiratory: Negative for cough, chest tightness and shortness of breath.   Cardiovascular: Negative for chest pain, palpitations and leg swelling.    Social History   Tobacco  Use  . Smoking status: Former Smoker    Packs/day: 1.00    Years: 27.00    Pack years: 27.00    Types: Cigarettes    Quit date: 04/18/1988    Years since quitting: 31.2  . Smokeless tobacco: Former Systems developer    Types: Chew  Substance Use Topics  . Alcohol use: Yes    Alcohol/week: 3.0 standard drinks    Types: 3 Standard drinks or equivalent per week    Comment: Occasional- Former Heavy ETOH Use      Objective:   BP 107/70 (BP Location: Left Arm, Patient Position: Sitting, Cuff Size: Large)    Pulse 76   Temp (!) 96.9 F (36.1 C) (Temporal)   Resp 16   Wt 222 lb (100.7 kg)   BMI 33.75 kg/m  Vitals:   07/01/19 1327  BP: 107/70  Pulse: 76  Resp: 16  Temp: (!) 96.9 F (36.1 C)  TempSrc: Temporal  Weight: 222 lb (100.7 kg)  Body mass index is 33.75 kg/m.   Physical Exam   No results found for any visits on 07/01/19.     Assessment & Plan    Problem List Items Addressed This Visit      Cardiovascular and Mediastinum   Essential hypertension    Well controlled Continue current medications Recheck metabolic panel F/u in 6 months       Paroxysmal atrial fibrillation (HCC)    Chronic and stable Followed by cardiology Just completed 30 day event monitor Continue ASA and beta blocker        Other   Obesity    Discussed importance of healthy weight management Discussed diet and exercise       Mixed hyperlipidemia    Continue Lipitor 48m daily Recheck CMP and FLP at next visit Goal LDL <70      History of subarachnoid hemorrhage    Followed by Neuro In 09/2018 Avoid anticoag as this was spontaneous      History of lacunar cerebrovascular accident (CVA) - Primary    Back to neuro baseline Continue to follow with neurology Continue lipitor and ASA          Return in about 4 months (around 10/31/2019) for chronic disease f/u (needs to be fasting).   The entirety of the information documented in the History of Present Illness, Review of Systems and Physical Exam were personally obtained by me. Portions of this information were initially documented by SLynford Humphrey CMA and reviewed by me for thoroughness and accuracy.    Cameron Foster, ADionne Bucy MD MPH [facility]

## 2019-07-01 NOTE — Patient Instructions (Signed)
Lacunar Stroke  A stroke is the sudden death of brain tissue that occurs when an area of the brain does not get enough oxygen. A lacunar stroke (lacunar infarction) is caused by a blockage in one of the small arteries deep in the brain. Lacunar stroke is a medical emergency that must be treated right away.It is important to get help right away as soon as you notice symptoms of stroke. What are the causes? A lacunar stroke occurs when small arteries deep in the brain become more narrow due to a buildup of fatty deposits in the arteries (atherosclerosis). When the arteries narrow, less blood flows to certain areas of the brain. Without enough blood and oxygen, these parts of the brain can die or become permanently damaged. What increases the risk? You are more likely to develop this condition if:  You have high blood pressure (hypertension).  You have high cholesterol (hyperlipidemia).  You smoke.  You have diabetes.  You are obese.  You have an unhealthy diet. This includes foods that are high in saturated fat, trans fat, and salt (sodium).  You have a heart rhythm disorder (atrial fibrillation).  You have heart disease.  You have artery disease, such as carotid artery disease or peripheral artery disease.  You have sickle cell disease.  You are age 79 or older.  You have a personal or family history of stroke.  You are African-American.  You are a woman who: ? Is pregnant. ? Has a history of diabetes during pregnancy (gestational diabetes). ? Has a history of preeclampsia or eclampsia. ? Has had hormone therapy after menopause. ? Uses oral birth control (contraception), especially while smoking. What are the signs or symptoms? Symptoms of this condition usually develop suddenly. They may include:  Weakness or numbness in your face, arm, or leg, especially on one side of your body.  Trouble walking or difficulty moving your arms or legs.  Loss of balance or  coordination.  Confusion.  Slurred speech (dysarthria).  Trouble speaking, understanding speech, or both (aphasia).  Vision problems in one or both eyes.  Dizziness.  Nausea and vomiting.  Severe headache with no known cause. How is this diagnosed? This condition may be diagnosed based on:  Your symptoms and medical history.  A physical exam.  Blood tests.  A CT scan of the brain.  MRI.  Ultrasound of an artery. This may help find blood flow problems or blockages.  Angiogram. During this test, dye is injected into your blood and then an X-ray is done to look for blockages. The dye helps blood flow and blockages show up clearly on X-rays.  Electroencephalogram (EEG). This test checks electrical activity in the brain. How is this treated? This condition must be treated within 4.5 hours of the start of the stroke. It is treated with IV medicine that dissolves the blood clot (tissue plasminogen activator, TPA) that is causing the blockage. The goal is to restore blood flow to the brain as soon as possible. Your health care provider may prescribe blood thinners (antiplatelets or anticoagulants) to lower your risk of another stroke. Follow these instructions at home: Medicines  Take over-the-counter and other prescription medicines only as told by your health care provider. This includes diabetes or cholesterol medicine.  If you were told to take a medicine to thin your blood, such as aspirin, take it exactly as told by your health care provider. ? Taking too much blood-thinning medicine can cause bleeding. ? If you do not take enough  blood-thinning medicine, you will not have the protection that you need against another stroke and other problems. Eating and drinking  Follow instructions from your health care provider about eating and drinking restrictions.  Eat a healthy diet. This includes plenty of fruits and vegetables, lean meats, whole grains, and low-fat dairy  products.  Avoid foods high in saturated fat, trans fat, or sodium.  Limit alcohol intake to no more than 1 drink a day for nonpregnant women and 2 drinks a day for men. One drink equals 12 oz of beer, 5 oz of wine, or 1 oz of hard liquor. Safety  If you need help walking, use a cane or walker as told by your health care provider.  Take steps to lower the risk of falls in your home. This may include: ? Using safety equipment, such as raised toilets and a seat in the shower. ? Removing clutter and tripping hazards from walkways, such as cords or rugs. ? Installing grab bars in the bedroom and bathroom. Activity  Exercise regularly, as told by your health care provider.  Take part in rehabilitation programs as told by your health care provider. This may include physical therapy, occupational therapy, or speech therapy. General instructions  Do not use any tobacco products, such as cigarettes, chewing tobacco, and e-cigarettes. If you need help quitting, ask your health care provider.  Keep all follow-up visits as told by your health care provider. This is important. Get help right away if:   You have any symptoms of stroke. "BE FAST" is an easy way to remember the main warning signs of stroke: ? B - Balance. Signs are dizziness, sudden trouble walking, or loss of balance. ? E - Eyes. Signs are trouble seeing or a sudden change in vision. ? F - Face. Signs are sudden weakness or numbness of the face, or the face or eyelid drooping on one side. ? A - Arms. Signs are weakness or numbness in an arm. This happens suddenly and usually on one side of the body. ? S - Speech. Signs are sudden trouble speaking, slurred speech, or trouble understanding what people say. ? T - Time. Time to call emergency services. Write down what time symptoms started.  You have other signs of stroke, such as: ? A sudden, severe headache with no known cause. ? Nausea or vomiting. ? Seizure.  You have a  severe fall or injury. These symptoms may represent a serious problem that is an emergency. Do not wait to see if the symptoms will go away. Get medical help right away. Call your local emergency services (911 in the [place] Do not drive yourself to the hospital. Summary  A lacunar stroke (lacunar infarction) is a blockage of one of the small arteries deep in the brain. When one of these arteries is blocked, parts of the brain do not get enough oxygen and may die.  This condition is a medical emergency that must be treated right away. Treatments must be done within 4.5 hours of the start of the stroke.  Controlling your risk factors for stroke is the best way to avoid another lacunar stroke.  Get help right away if you have any symptoms of stroke. "BE FAST" is an easy way to remember the main warning signs of stroke. This information is not intended to replace advice given to you by your health care provider. Make sure you discuss any questions you have with your health care provider. Document Revised: 12/22/2017 Document Reviewed: 08/19/2016 Elsevier  Patient Education  [employer].

## 2019-07-01 NOTE — Assessment & Plan Note (Signed)
Chronic and stable Followed by cardiology Just completed 30 day event monitor Continue ASA and beta blocker

## 2019-07-01 NOTE — Assessment & Plan Note (Signed)
Continue Lipitor 70m daily Recheck CMP and FLP at next visit Goal LDL <70

## 2019-07-01 NOTE — Assessment & Plan Note (Signed)
Discussed importance of healthy weight management Discussed diet and exercise  

## 2019-07-08 DIAGNOSIS — I639 Cerebral infarction, unspecified: Secondary | ICD-10-CM | POA: Diagnosis not present

## 2019-07-10 DIAGNOSIS — I6381 Other cerebral infarction due to occlusion or stenosis of small artery: Secondary | ICD-10-CM | POA: Insufficient documentation

## 2019-07-10 DIAGNOSIS — Z8673 Personal history of transient ischemic attack (TIA), and cerebral infarction without residual deficits: Secondary | ICD-10-CM | POA: Insufficient documentation

## 2019-07-20 ENCOUNTER — Other Ambulatory Visit: Payer: Self-pay | Admitting: Internal Medicine

## 2019-07-22 ENCOUNTER — Other Ambulatory Visit: Payer: Self-pay | Admitting: Family Medicine

## 2019-07-31 ENCOUNTER — Other Ambulatory Visit: Payer: Self-pay | Admitting: Family Medicine

## 2019-07-31 ENCOUNTER — Other Ambulatory Visit: Payer: Self-pay

## 2019-07-31 ENCOUNTER — Encounter: Payer: Self-pay | Admitting: Internal Medicine

## 2019-07-31 ENCOUNTER — Ambulatory Visit (INDEPENDENT_AMBULATORY_CARE_PROVIDER_SITE_OTHER): Payer: Medicare Other | Admitting: Internal Medicine

## 2019-07-31 VITALS — BP 110/74 | HR 77 | Ht 68.0 in | Wt 223.2 lb

## 2019-07-31 DIAGNOSIS — I480 Paroxysmal atrial fibrillation: Secondary | ICD-10-CM | POA: Diagnosis not present

## 2019-07-31 DIAGNOSIS — E785 Hyperlipidemia, unspecified: Secondary | ICD-10-CM | POA: Diagnosis not present

## 2019-07-31 DIAGNOSIS — I471 Supraventricular tachycardia: Secondary | ICD-10-CM | POA: Diagnosis not present

## 2019-07-31 DIAGNOSIS — I10 Essential (primary) hypertension: Secondary | ICD-10-CM

## 2019-07-31 DIAGNOSIS — I639 Cerebral infarction, unspecified: Secondary | ICD-10-CM | POA: Diagnosis not present

## 2019-07-31 MED ORDER — SERTRALINE HCL 100 MG PO TABS: 100.0000 mg | ORAL_TABLET | Freq: Every day | ORAL | 0 refills | Status: DC

## 2019-07-31 NOTE — Telephone Encounter (Signed)
Copied from [place] [id number]. Topic: Quick Communication - Rx Refill/Question >> Jul 31, 2019 12:33 PM Leward Quan A wrote: Medication: sertraline (ZOLOFT) 100 MG tablet  Completely out need Rx today please  Has the patient contacted their pharmacy? Yes.   (Agent: If no, request that the patient contact the pharmacy for the refill.) (Agent: If yes, when and what did the pharmacy advise?)  Preferred Pharmacy (with phone number or street name): [id number]  Phone:  [phone]:  [phone]    Agent: Please be advised that RX refills may take up to 3 business days. We ask that you follow-up with your pharmacy.

## 2019-07-31 NOTE — Progress Notes (Signed)
Follow-up Outpatient Visit Date: 07/31/2019  Primary Care Provider: [name], MD [address] [place] [address]  Chief Complaint: Follow-up stroke  HPI:  Cameron Foster is a 79 y.o. male with history of hypertension, hyperlipidemia, single episode of atrial fibrillation in 07-03-1993, subarachnoid hemorrhage of uncertain etiology and more recent stroke, prostate cancer, and alcohol abuse, who presents for follow-up of recent stroke.  I last saw him a month ago, at which time Cameron Foster was feeling back to normal with resolution of left hand weakness.  Chronic exertional dyspnea was stable to slightly better than at prior visits.  He was subsequently seen by Dr. Manuella Ghazi, neurology, who was concerned that appearance of recent infarct could be consistent with an embolic event.  14-day event monitor last month showed multiple episodes of brief PSVT but no atrial fibrillation.  Today, Cameron Foster is doing relatively well.  He was started on CPAP a few weeks ago by his neurologist and has been having a difficult time wearing the mask at night.  He does not feel like it has helped him much yet.  From a heart standpoint, he is doing well without chest pain, shortness of breath, palpitations, or lightheadedness.  Chronic lower extremity edema is stable.  He has not had any new focal neurologic deficits.  He does not check his blood pressure regularly at home.  --------------------------------------------------------------------------------------------------  Past Medical History:  Diagnosis Date  . Alcoholism ([place])   . Arthritis   . Atrial fibrillation ([place])    one episode  . Colon polyp   . Depression    Son died 03-Jul-2014  . Diverticulitis   . Diverticulosis 30 years  . Dysrhythmia    At Fib 1995  . GERD (gastroesophageal reflux disease)   . Hyperlipidemia   . Hypertension   . Irritable bowel syndrome   . Prostate cancer ([place]) 07-03-10   treated with radiation therapy. Prostate  .  Vasovagal syncope    Past Surgical History:  Procedure Laterality Date  . [place]in no stents  . CATARACT EXTRACTION, BILATERAL    . COLON RESECTION SIGMOID N/A 12/07/2016   Procedure: COLON RESECTION SIGMOID;  Surgeon: Clayburn Pert, MD;  Location: [facility] ORS;  Service: General;  Laterality: N/A;  . COLON SURGERY  11/2016   Colostomy  . COLONOSCOPY  2014-07-03  . COLONOSCOPY WITH PROPOFOL N/A 05/30/2016   Procedure: COLONOSCOPY WITH PROPOFOL;  Surgeon: Lollie Sails, MD;  [facility];  Service: Endoscopy;  Laterality: N/A;  . COLOSTOMY Left 12/07/2016   Procedure: COLOSTOMY;  Surgeon: Clayburn Pert, MD;  Location: [facility] ORS;  Service: General;  Laterality: Left;  . COLOSTOMY REVERSAL N/A 03/21/2017   Procedure: COLOSTOMY REVERSAL;  Surgeon: Clayburn Pert, MD;  Location: [facility] ORS;  Service: General;  Laterality: N/A;  . COLOSTOMY TAKEDOWN N/A 03/21/2017   Procedure: LAPAROSCOPIC COLOSTOMY TAKEDOWN;  Surgeon: Clayburn Pert, MD;  Location: [facility] ORS;  Service: General;  Laterality: N/A;  . CYSTOSCOPY WITH STENT PLACEMENT Bilateral 03/21/2017   Procedure: CYSTOSCOPY WITH LIGHTED STENT PLACEMENT;  Surgeon: Abbie Sons, MD;  Location: [facility] ORS;  Service: Urology;  Laterality: Bilateral;  . ESOPHAGOGASTRODUODENOSCOPY    . ESOPHAGOGASTRODUODENOSCOPY N/A 05/30/2016   Procedure: ESOPHAGOGASTRODUODENOSCOPY (EGD);  Surgeon: Lollie Sails, MD;  Location: [facility] ENDOSCOPY;  Service: Endoscopy;  Laterality: N/A;  . EYE SURGERY     cataracts  . FLEXIBLE SIGMOIDOSCOPY N/A 03/21/2017   Procedure: FLEXIBLE SIGMOIDOSCOPY;  Surgeon: Clayburn Pert,  MD;  Location: [facility] ORS;  Service: General;  Laterality: N/A;  . FRACTURE SURGERY Bilateral    right arm and left wrist  . INCISION AND DRAINAGE ABSCESS N/A 12/07/2016   Procedure: DRAINAGE  OF INTRA ABDOMINAL ABSCESS;  Surgeon: Clayburn Pert, MD;  Location: [facility] ORS;  Service: General;  Laterality:  N/A;  . KNEE ARTHROSCOPY    . LAPAROTOMY N/A 12/07/2016   Procedure: EXPLORATORY LAPAROTOMY;  Surgeon: Clayburn Pert, MD;  Location: [facility] ORS;  Service: General;  Laterality: N/A;  . PROSTATE SURGERY     Microwave therapy  . TONSILLECTOMY    . TOTAL KNEE ARTHROPLASTY Right 07/27/2016   Procedure: TOTAL KNEE ARTHROPLASTY;  Surgeon: [name], MD;  Location: [facility] ORS;  Service: Orthopedics;  Laterality: Right;  Dr. Erlene Quan had to place Urinary catheter due to prostate cancer history.  Using flexible scope.    Current Meds  Medication Sig  . amLODipine (NORVASC) 10 MG tablet TAKE 1 TABLET BY MOUTH EVERY DAY  . aspirin 81 MG EC tablet Take 81 mg by mouth daily.   Marland Kitchen atorvastatin (LIPITOR) 40 MG tablet Take 1 tablet (40 mg total) by mouth daily at 6 PM. (Patient taking differently: Take 40 mg by mouth daily. )  . Cyanocobalamin (VITAMIN B 12 PO) Take 1,000 mcg by mouth daily.  Marland Kitchen docusate sodium (STOOL SOFTENER) 100 MG capsule Take 1 capsule (100 mg total) by mouth daily.  . fluticasone (FLONASE) 50 MCG/ACT nasal spray Place 1 spray into both nostrils daily.  . furosemide (LASIX) 20 MG tablet TAKE 1 TABLET BY MOUTH EVERY DAY  . lisinopril (ZESTRIL) 10 MG tablet TAKE 1 TABLET BY MOUTH EVERY DAY  . meclizine (ANTIVERT) 25 MG tablet Take 25 mg by mouth 2 (two) times daily as needed.  . meloxicam (MOBIC) 15 MG tablet Take 1 tablet (15 mg total) by mouth daily as needed for pain.  . metoprolol succinate (TOPROL-XL) 50 MG 24 hr tablet TAKE 1 TABLET (50 MG TOTAL) BY MOUTH DAILY. TAKE WITH OR IMMEDIATELY FOLLOWING A MEAL.  . Multiple Vitamin (MULTIVITAMIN WITH MINERALS) TABS tablet Take 1 tablet by mouth daily.  Marland Kitchen omeprazole (PRILOSEC) 20 MG capsule Take 1 capsule (20 mg total) by mouth daily.  . sertraline (ZOLOFT) 100 MG tablet Take 1 tablet (100 mg total) by mouth daily.    Allergies: Augmentin [amoxicillin-pot clavulanate], Formaldehyde, and Tape  Social History   Tobacco Use  . Smoking  status: Former Smoker    Packs/day: 1.00    Years: 27.00    Pack years: 27.00    Types: Cigarettes    Quit date: 04/18/1988    Years since quitting: 31.3  . Smokeless tobacco: Former Systems developer    Types: Chew  Substance Use Topics  . Alcohol use: Yes    Alcohol/week: 3.0 standard drinks    Types: 3 Standard drinks or equivalent per week    Comment: Occasional- Former Heavy ETOH Use  . Drug use: No    Family History  Problem Relation Age of Onset  . Lung cancer Father        smoker  . Other Mother   . Sudden death Son        due to "Blood clots"  . Bipolar disorder Son   . Kidney disease Daughter        congenital one small kidney  . Prostate cancer Neg Hx   . Bladder Cancer Neg Hx     Review of Systems: A 12-system review of systems  was performed and was negative except as noted in the HPI.  --------------------------------------------------------------------------------------------------  Physical Exam: BP 110/74 (BP Location: Left Arm, Patient Position: Sitting, Cuff Size: Normal)   Pulse 77   Ht 5' 8"  (1.727 m)   Wt 223 lb 4 oz (101.3 kg)   SpO2 93%   BMI 33.95 kg/m   General: NAD. Neck: No JVD or HJR. Lungs: Clear to auscultation bilaterally without wheezes or crackles. Heart: Regular rate and rhythm without murmurs, rubs, or gallops. Abdomen: Soft, nontender, nondistended. Extremities: Trace lower extremity edema.  EKG: Normal sinus rhythm with LAFB, incomplete RBBB, LVH, and nonspecific ST/T changes.  Compared with prior tracings, incomplete RBBB is now present.  Lab Results  Component Value Date   WBC 6.0 06/10/2019   HGB 11.5 (L) 06/10/2019   HCT 36.1 (L) 06/10/2019   MCV 91.4 06/10/2019   PLT 213 06/10/2019    Lab Results  Component Value Date   NA 140 06/10/2019   K 3.8 06/10/2019   CL 106 06/10/2019   CO2 27 06/10/2019   BUN 21 06/10/2019   CREATININE 1.30 (H) 06/10/2019   GLUCOSE 101 (H) 06/10/2019   ALT 15 06/08/2019    Lab Results    Component Value Date   CHOL 158 06/09/2019   HDL 36 (L) 06/09/2019   LDLCALC 89 06/09/2019   TRIG 165 (H) 06/09/2019   CHOLHDL 4.4 06/09/2019    --------------------------------------------------------------------------------------------------  ASSESSMENT AND PLAN: Stroke and paroxysmal atrial fibrillation: Cameron Foster has not had any new focal neurologic deficits or symptoms to suggest recurrent atrial fibrillation.  Only documented event of atrial fibrillation occurred in 2005.  Recent event monitor showed several episodes of PSVT but no atrial fibrillation.  Though his CHA2DS2-VASc score is at least 4 and his neurologist is concerned about potential for cardioembolic etiology, we remain reluctant to initiate anticoagulation given spontaneous subarachnoid hemorrhage last year.  We discussed several options, including continuation of low-dose aspirin alone, referral to EP to discuss implantable loop recorder, and consideration of Watchman/Lariat.  Cameron Foster would like to avoid further procedures for now and continue with low-dose aspirin.  I would be reluctant to proceed directly to Watchman unless there is clear evidence of recurrent atrial fibrillation, given associated risks of procedure and need for at least temporary DAPT/anticoagulation.  PSVT: Multiple episodes noted on recent event monitor, which were asymptomatic.  We will defer escalation of metoprolol at this time.  Hyperlipidemia: Continue atorvastatin 40 mg daily.  Hypertension: Blood pressure well controlled today.  No medication changes at this time.  Follow-up: Return to clinic in 6 months.  [name], MD 08/01/2019 7:52 AM

## 2019-07-31 NOTE — Patient Instructions (Signed)
Medication Instructions:  Your physician recommends that you continue on your current medications as directed. Please refer to the Current Medication list given to you today.  *If you need a refill on your cardiac medications before your next appointment, please call your pharmacy*  Follow-Up: At [facility], you and your health needs are our priority.  As part of our continuing mission to provide you with exceptional heart care, we have created designated Provider Care Teams.  These Care Teams include your primary Cardiologist (physician) and Advanced Practice Providers (APPs -  Physician Assistants and Nurse Practitioners) who all work together to provide you with the care you need, when you need it.  We recommend signing up for the patient portal called "MyChart".  Sign up information is provided on this After Visit Summary.  MyChart is used to connect with patients for Virtual Visits (Telemedicine).  Patients are able to view lab/test results, encounter notes, upcoming appointments, etc.  Non-urgent messages can be sent to your provider as well.   To learn more about what you can do with MyChart, go to [email or web address].    Your next appointment:   6 month(s)  The format for your next appointment:   In Person  Provider:    You may see DR Harrell Gave END or one of the following Advanced Practice Providers on your designated Care Team:    Murray Hodgkins, NP  Christell Faith, PA-C  Marrianne Mood, PA-C

## 2019-08-01 ENCOUNTER — Encounter: Payer: Self-pay | Admitting: Internal Medicine

## 2019-08-01 DIAGNOSIS — I471 Supraventricular tachycardia, unspecified: Secondary | ICD-10-CM | POA: Insufficient documentation

## 2019-08-07 ENCOUNTER — Other Ambulatory Visit: Payer: Self-pay | Admitting: Family Medicine

## 2019-08-07 ENCOUNTER — Telehealth: Payer: Self-pay

## 2019-08-07 NOTE — Telephone Encounter (Signed)
Requested Prescriptions  Pending Prescriptions Disp Refills  . fluticasone (FLONASE) 50 MCG/ACT nasal spray [Pharmacy Med Name: FLUTICASONE PROP 50 MCG SPRAY] 48 mL 1    Sig: PLACE 1 SPRAY INTO BOTH NOSTRILS DAILY AS NEEDED FOR ALLERGIES OR RHINITIS.     Ear, Nose, and Throat: Nasal Preparations - Corticosteroids Passed - 08/07/2019  3:19 AM      Passed - Valid encounter within last 12 months    Recent Outpatient Visits          1 month ago History of lacunar cerebrovascular accident (CVA)   [place] Bacigalupo, Dionne Bucy, MD   2 months ago Encounter for annual wellness visit (AWV) in Medicare patient   [facility] [place], Dionne Bucy, MD   8 months ago [place] Bacigalupo, Dionne Bucy, MD   9 months ago [facility] (sub[facility]ach[facility]oid hemorrhage) [facility])   [facility], Dionne Bucy, MD   11 months ago Acute recurrent maxillary sinusitis   [facility] [place], Dionne Bucy, MD      Future Appointments            In 1 week Bacigalupo, Dionne Bucy, MD [facility], [place]   In 1 week [name], MD [place]   In 2 months Bacigalupo, Dionne Bucy, MD [facility], [place]   In 5 months End, Harrell Gave, MD [facility], [place]

## 2019-08-07 NOTE — Telephone Encounter (Signed)
Copied from [place] [id number]. Topic: Quick Communication - See Telephone Encounter >> Aug 07, 2019  1:22 PM L[place] wrote: CRM for notification. See Telephone encounter for: 08/07/19. PT calling agitated as wants to talk to Dr B for a referral and does not want to make an appt. Would like at least her nurse to FU with him at [phone]

## 2019-08-07 NOTE — Telephone Encounter (Signed)
Patient requesting referral to osteopathic. Patient reports he has scheduled an appointment with someone in [facility] for 08/23/2019. Patient reports he will talk to Dr. B at next appt. 08/14/2019.

## 2019-08-14 ENCOUNTER — Encounter: Payer: Self-pay | Admitting: Family Medicine

## 2019-08-14 ENCOUNTER — Other Ambulatory Visit: Payer: Self-pay

## 2019-08-14 ENCOUNTER — Ambulatory Visit (INDEPENDENT_AMBULATORY_CARE_PROVIDER_SITE_OTHER): Payer: Medicare Other | Admitting: Family Medicine

## 2019-08-14 VITALS — BP 106/71 | HR 76 | Temp 97.3°F | Ht 68.0 in | Wt 223.0 lb

## 2019-08-14 DIAGNOSIS — E669 Obesity, unspecified: Secondary | ICD-10-CM

## 2019-08-14 DIAGNOSIS — E782 Mixed hyperlipidemia: Secondary | ICD-10-CM

## 2019-08-14 DIAGNOSIS — Z6833 Body mass index (BMI) 33.0-33.9, adult: Secondary | ICD-10-CM | POA: Diagnosis not present

## 2019-08-14 DIAGNOSIS — E785 Hyperlipidemia, unspecified: Secondary | ICD-10-CM | POA: Diagnosis not present

## 2019-08-14 NOTE — Progress Notes (Signed)
Established patient visit   Patient: Cameron Foster.   DOB: 23-Jun-1940   79 y.o. Male  MRN: [id number] Visit Date: 08/14/2019  I,Sulibeya S Dimas,acting as a scribe for Lavon Paganini, MD.,have documented all relevant documentation on the behalf of Lavon Paganini, MD,as directed by  Lavon Paganini, MD while in the presence of Lavon Paganini, MD   Today's healthcare provider: Lavon Paganini, MD   Chief Complaint  Patient presents with  . Hyperlipidemia   Subjective    HPI Lipid/Cholesterol, Follow-up  Last lipid panel Other pertinent labs  Lab Results  Component Value Date   CHOL 158 06/09/2019   HDL 36 (L) 06/09/2019   LDLCALC 89 06/09/2019   TRIG 165 (H) 06/09/2019   CHOLHDL 4.4 06/09/2019   Lab Results  Component Value Date   ALT 15 06/08/2019   AST 27 06/08/2019   PLT 213 06/10/2019   TSH 5.605 (H) 10/20/2018     He was last seen for this 3 months ago.  Management since that visit includes increase atorvastatin 44m daily.  He reports excellent compliance with treatment. He is not having side effects.  Symptoms: No chest pain No chest pressure/discomfort No dyspnea No lower extremity edema No numbness or tingling of extremity No orthopnea No palpitations No paroxysmal nocturnal dyspnea No speech difficulty No syncope  Current diet: well balanced Current exercise: none  Wt Readings from Last 3 Encounters:  08/14/19 223 lb (101.2 kg)  07/31/19 223 lb 4 oz (101.3 kg)  07/01/19 222 lb (100.7 kg)   The ASCVD Risk score (Mikey BussingDC Jr., et al., 2013) failed to calculate for the following reasons:   The patient has a prior MI or stroke diagnosis  ----------------------------------------------------------------------------------------- Patient reports that he will be going to ortho in the next few days. Patient reports he will talk to them about neck pain and back pain. Patient reports that he wants to start walking more.  Patient  reports he has been using CPAP, but reports no improvement with symptoms. Patient reports dr. SManuella Ghaziordered CPAP.   Social History   Tobacco Use  . Smoking status: Former Smoker    Packs/day: 1.00    Years: 27.00    Pack years: 27.00    Types: Cigarettes    Quit date: 04/18/1988    Years since quitting: 31.3  . Smokeless tobacco: Former [employer] developer   Types: Chew  Substance Use Topics  . Alcohol use: Yes    Alcohol/week: 3.0 standard drinks    Types: 3 Standard drinks or equivalent per week    Comment: Occasional- Former Heavy ETOH Use  . Drug use: No       Medications: Outpatient Medications Prior to Visit  Medication Sig  . amLODipine (NORVASC) 10 MG tablet TAKE 1 TABLET BY MOUTH EVERY DAY  . aspirin 81 MG EC tablet Take 81 mg by mouth daily.   .Marland Kitchenatorvastatin (LIPITOR) 40 MG tablet Take 1 tablet (40 mg total) by mouth daily at 6 PM. (Patient taking differently: Take 40 mg by mouth daily. )  . Cyanocobalamin (VITAMIN B 12 PO) Take 1,000 mcg by mouth daily.  .Marland Kitchendocusate sodium (STOOL SOFTENER) 100 MG capsule Take 1 capsule (100 mg total) by mouth daily.  . fluticasone (FLONASE) 50 MCG/ACT nasal spray PLACE 1 SPRAY INTO BOTH NOSTRILS DAILY AS NEEDED FOR ALLERGIES OR RHINITIS.  . furosemide (LASIX) 20 MG tablet TAKE 1 TABLET BY MOUTH EVERY DAY  . lisinopril (ZESTRIL) 10 MG tablet  TAKE 1 TABLET BY MOUTH EVERY DAY  . meclizine (ANTIVERT) 25 MG tablet Take 25 mg by mouth 2 (two) times daily as needed.  . meloxicam (MOBIC) 15 MG tablet Take 1 tablet (15 mg total) by mouth daily as needed for pain.  . metoprolol succinate (TOPROL-XL) 50 MG 24 hr tablet TAKE 1 TABLET (50 MG TOTAL) BY MOUTH DAILY. TAKE WITH OR IMMEDIATELY FOLLOWING A MEAL.  . Multiple Vitamin (MULTIVITAMIN WITH MINERALS) TABS tablet Take 1 tablet by mouth daily.  Marland Kitchen omeprazole (PRILOSEC) 20 MG capsule Take 1 capsule (20 mg total) by mouth daily.  . sertraline (ZOLOFT) 100 MG tablet Take 1 tablet (100 mg total) by mouth daily.    No facility-administered medications prior to visit.    Review of Systems  Constitutional: Negative for appetite change, chills and fever.  Respiratory: Negative.   Cardiovascular: Positive for leg swelling. Negative for chest pain and palpitations.  Musculoskeletal: Positive for back pain and neck pain.    Last metabolic panel Lab Results  Component Value Date   GLUCOSE 101 (H) 06/10/2019   NA 140 06/10/2019   K 3.8 06/10/2019   CL 106 06/10/2019   CO2 27 06/10/2019   BUN 21 06/10/2019   CREATININE 1.30 (H) 06/10/2019   GFRNONAA 52 (L) 06/10/2019   GFRAA >60 06/10/2019   CALCIUM 9.1 06/10/2019   PROT 7.2 06/08/2019   ALBUMIN 3.9 06/08/2019   LABGLOB 2.8 05/15/2019   AGRATIO 1.5 05/15/2019   BILITOT 0.8 06/08/2019   ALKPHOS 75 06/08/2019   AST 27 06/08/2019   ALT 15 06/08/2019   ANIONGAP 7 06/10/2019      Objective    BP 106/71 (BP Location: Left Arm, Patient Position: Sitting, Cuff Size: Large)   Pulse 76   Temp (!) 97.3 F (36.3 C) (Temporal)   Ht 5' 8"  (1.727 m)   Wt 223 lb (101.2 kg)   BMI 33.91 kg/m  BP Readings from Last 3 Encounters:  08/14/19 106/71  07/31/19 110/74  07/01/19 107/70   Wt Readings from Last 3 Encounters:  08/14/19 223 lb (101.2 kg)  07/31/19 223 lb 4 oz (101.3 kg)  07/01/19 222 lb (100.7 kg)      Physical Exam Vitals reviewed.  Constitutional:      Appearance: Normal appearance.  HENT:     Head: Normocephalic and atraumatic.  Eyes:     General: No scleral icterus.    Conjunctiva/sclera: Conjunctivae normal.  Pulmonary:     Effort: Pulmonary effort is normal. No respiratory distress.  Neurological:     Mental Status: He is alert and oriented to person, place, and time. Mental status is at baseline.  Psychiatric:        Mood and Affect: Mood normal.        Behavior: Behavior normal.      No results found for any visits on 08/14/19.  Assessment & Plan     Problem List Items Addressed This Visit      Other    Obesity    Discussed importance of healthy weight management Discussed diet and exercise       Hyperlipidemia LDL goal <70    Well controlled Continue statin Repeat FLP and CMP Goal LDL < 70        Other Visit Diagnoses    Mixed hyperlipidemia    -  Primary   Relevant Orders   Basic metabolic panel   Lipid Panel With LDL/HDL Ratio       Return for  as scheduled.      I, Lavon Paganini, MD, have reviewed all documentation for this visit. The documentation on 08/14/19 for the exam, diagnosis, procedures, and orders are all accurate and complete.   Helaina Stefano, Dionne Bucy, MD, MPH [facility]

## 2019-08-14 NOTE — Assessment & Plan Note (Addendum)
Well controlled Continue statin Repeat FLP and CMP Goal LDL < 70

## 2019-08-14 NOTE — Assessment & Plan Note (Signed)
Discussed importance of healthy weight management Discussed diet and exercise  

## 2019-08-15 DIAGNOSIS — E782 Mixed hyperlipidemia: Secondary | ICD-10-CM | POA: Diagnosis not present

## 2019-08-16 ENCOUNTER — Telehealth: Payer: Self-pay

## 2019-08-16 ENCOUNTER — Telehealth: Payer: Self-pay | Admitting: Family Medicine

## 2019-08-16 LAB — LIPID PANEL WITH LDL/HDL RATIO
Cholesterol, Total: 156 mg/dL (ref 100–199)
HDL: 46 mg/dL (ref >39)
LDL Chol Calc (NIH): 85 mg/dL (ref 0–99)
LDL/HDL Ratio: 1.8 ratio (ref 0.0–3.6)
Triglycerides: 144 mg/dL (ref 0–149)
VLDL Cholesterol Cal: 25 mg/dL (ref 5–40)

## 2019-08-16 LAB — BASIC METABOLIC PANEL
BUN/Creatinine Ratio: 13 (ref 10–24)
BUN: 17 mg/dL (ref 8–27)
CO2: 22 mmol/L (ref 20–29)
Calcium: 9.5 mg/dL (ref 8.6–10.2)
Chloride: 106 mmol/L (ref 96–106)
Creatinine, Ser: 1.27 mg/dL (ref 0.76–1.27)
GFR calc Af Amer: 62 mL/min/{1.73_m2} (ref >59)
GFR calc non Af Amer: 54 mL/min/{1.73_m2} — ABNORMAL LOW (ref >59)
Glucose: 93 mg/dL (ref 65–99)
Potassium: 4.1 mmol/L (ref 3.5–5.2)
Sodium: 144 mmol/L (ref 134–144)

## 2019-08-16 NOTE — Telephone Encounter (Signed)
-----   Message from [name], MD sent at 08/16/2019  9:27 AM EDT ----- Normal/stable labs

## 2019-08-16 NOTE — Telephone Encounter (Signed)
Patient requesting call back from clinical staff to discuss latest lab results.

## 2019-08-16 NOTE — Telephone Encounter (Signed)
[employer]   Result Notes and Comments to Patient Comment seen by patient Cameron Foster. on 08/16/2019 9:32 AM EDT

## 2019-08-16 NOTE — Telephone Encounter (Signed)
Reviewed lipid panel with patient. Patient wanted to know if HDL was good at 46. I advised patient that HDL should be over 39.

## 2019-08-20 ENCOUNTER — Ambulatory Visit (INDEPENDENT_AMBULATORY_CARE_PROVIDER_SITE_OTHER): Payer: Medicare Other | Admitting: Dermatology

## 2019-08-20 ENCOUNTER — Other Ambulatory Visit: Payer: Self-pay

## 2019-08-20 DIAGNOSIS — L578 Other skin changes due to chronic exposure to nonionizing radiation: Secondary | ICD-10-CM | POA: Diagnosis not present

## 2019-08-20 DIAGNOSIS — L821 Other seborrheic keratosis: Secondary | ICD-10-CM

## 2019-08-20 DIAGNOSIS — L299 Pruritus, unspecified: Secondary | ICD-10-CM

## 2019-08-20 DIAGNOSIS — Z85828 Personal history of other malignant neoplasm of skin: Secondary | ICD-10-CM

## 2019-08-20 DIAGNOSIS — L820 Inflamed seborrheic keratosis: Secondary | ICD-10-CM

## 2019-08-20 DIAGNOSIS — L570 Actinic keratosis: Secondary | ICD-10-CM

## 2019-08-20 DIAGNOSIS — Z86007 Personal history of in-situ neoplasm of skin: Secondary | ICD-10-CM | POA: Diagnosis not present

## 2019-08-20 DIAGNOSIS — L853 Xerosis cutis: Secondary | ICD-10-CM

## 2019-08-20 MED ORDER — TRIAMCINOLONE ACETONIDE 0.1 % EX CREA: TOPICAL_CREAM | CUTANEOUS | 2 refills | Status: DC

## 2019-08-20 NOTE — Progress Notes (Signed)
Follow-Up Visit   Subjective  Cameron Foster. is a 79 y.o. male who presents for the following: BCC f/u (Left ant. shoulder, Right med cheek), SCCis f/u (Right post. shoulder, Left med calf, R med brow), ISK  f/u (back x 2, L ant. shoulder, and R upper arm), and new spots (Patient states that he has new spots on his back that are extremely itchy, no pain).   The following portions of the chart were reviewed this encounter and updated as appropriate:     Review of Systems:  No other skin or systemic complaints except as noted in HPI or Assessment and Plan.  Objective  Well appearing patient in no apparent distress; mood and affect are within normal limits.  All skin waist up examined.  Objective  Dorsum of Nose, Left Helix, Right infra Occular: Pink scaly macules  Objective  Left Shoulder - Anterior, Right med cheek: Well healed scar with no evidence of recurrence.  Left anterior shoulder treated with [facility] 2/21  Objective  Right Shoulder - Posterior, Left med calf, Right med brow: Well healed scar with no evidence of recurrence- R posterior shoulder treated with [facility] 2/21  Objective  Left Postauricular Area x 2 (2), Left post. Flank x 15 (15), Right  post Flank x 7 (7): Erythematous keratotic or waxy stuck-on papule or plaque.   Objective  Back: Xerosis back, flanks, itchy   Assessment & Plan  AK (actinic keratosis) (3) Right infra Occular; Left Helix; Dorsum of Nose  Cryotherapy Today Prior to procedure, discussed risks of blister formation, small wound, skin dyspigmentation, or rare scar following cryotherapy.   Discussed PDT treatment. May schedule in fall.  Destruction of lesion - Dorsum of Nose, Left Helix, Right infra Occular  Destruction method: cryotherapy   Informed consent: discussed and consent obtained   Lesion destroyed using liquid nitrogen: Yes   Region frozen until ice ball extended beyond lesion: Yes   Outcome: patient tolerated procedure well  with no complications   Post-procedure details: wound care instructions given    History of basal cell carcinoma (BCC) Left Shoulder - Anterior, Right med cheek  Clear. Observe for recurrence. Call clinic for new or changing lesions.  Recommend regular skin exams, daily broad-spectrum spf 30+ sunscreen use, and photoprotection.     History of squamous cell carcinoma in situ (SCCIS) of skin Right Shoulder - Posterior, Left med calf, Right med brow  Clear. Observe for recurrence. Call clinic for new or changing lesions.  Recommend regular skin exams, daily broad-spectrum spf 30+ sunscreen use, and photoprotection.     Inflamed seborrheic keratosis (24) Left post. Flank x 15 (15); Right  post Flank x 7 (7); Left Postauricular Area x 2 (2)  Cryotherapy today   Prior to procedure, discussed risks of blister formation, small wound, skin dyspigmentation, or rare scar following cryotherapy.   Can use TMC 0.1% cream bid prn itch on back if itch persists post healing   Destruction of lesion - Left Postauricular Area x 2, Left post. Flank x 15, Right  post Flank x 7  Destruction method: cryotherapy   Informed consent: discussed and consent obtained   Lesion destroyed using liquid nitrogen: Yes   Region frozen until ice ball extended beyond lesion: Yes   Outcome: patient tolerated procedure well with no complications   Post-procedure details: wound care instructions given    Xerosis cutis Back  With Pruritus Start TMC 0.1% cream to aas on skin per itch. #80g 2Rf Avoid  applying to face, groin, and axilla. Use as directed. Risk of skin atrophy with long-term use reviewed. Recommend mild soap and moisturizing cream 1-2 times daily.     Topical steroids (such as triamcinolone, fluocinolone, fluocinonide, mometasone, clobetasol, halobetasol, betamethasone, hydrocortisone) can cause thinning and lightening of the skin if they are used for too long in the same area. Your physician has selected  the right strength medicine for your problem and area affected on the body. Please use your medication only as directed by your physician to prevent side effects.    triamcinolone cream (KENALOG) 0.1 % - Back   Actinic Damage - diffuse scaly erythematous macules with underlying dyspigmentation - Recommend daily broad spectrum sunscreen SPF 30+ to sun-exposed areas, reapply every 2 hours as needed.  - Call for new or changing lesions.  Seborrheic Keratoses - Stuck-on, waxy, tan-brown papules and plaques  - Discussed benign etiology and prognosis. - Observe - Call for any changes  Return in about 3 months (around 11/20/2019) for AK and ISK Follow up.  Marene Lenz, CMA, am acting as scribe for [name], MD .  Documentation: I have reviewed the above documentation for accuracy and completeness, and I agree with the above.  [name], MD

## 2019-08-20 NOTE — Patient Instructions (Addendum)
Recommend daily broad spectrum sunscreen SPF 30+ to sun-exposed areas, reapply every 2 hours as needed. Call for new or changing lesions.  Topical steroids (such as triamcinolone, fluocinolone, fluocinonide, mometasone, clobetasol, halobetasol, betamethasone, hydrocortisone) can cause thinning and lightening of the skin if they are used for too long in the same area. Your physician has selected the right strength medicine for your problem and area affected on the body. Please use your medication only as directed by your physician to prevent side effects. Avoid applying to face, groin, and axilla. Use as directed. Risk of skin atrophy with long-term use reviewed.

## 2019-08-21 ENCOUNTER — Other Ambulatory Visit: Payer: Self-pay | Admitting: Internal Medicine

## 2019-08-26 ENCOUNTER — Other Ambulatory Visit: Payer: Self-pay | Admitting: Family Medicine

## 2019-08-28 DIAGNOSIS — I6381 Other cerebral infarction due to occlusion or stenosis of small artery: Secondary | ICD-10-CM | POA: Diagnosis not present

## 2019-09-02 ENCOUNTER — Other Ambulatory Visit: Payer: Self-pay | Admitting: Family Medicine

## 2019-09-06 ENCOUNTER — Other Ambulatory Visit: Payer: Self-pay | Admitting: Family Medicine

## 2019-09-06 MED ORDER — ATORVASTATIN CALCIUM 40 MG PO TABS: 40.0000 mg | ORAL_TABLET | Freq: Every day | ORAL | 3 refills | Status: DC

## 2019-09-06 NOTE — Telephone Encounter (Signed)
Requested medication (s) are due for refill today:   Yes  Requested medication (s) are on the active medication list:   Yes  Future visit scheduled:   Yes in 1 month   Last ordered: 06/10/2019  #30 with 2 refills.  Started 06/10/2019 - ends 09/08/2019.  Clinic note:   This Rx was given during an ED visit from Dr. Enzo Bi.       Requested Prescriptions  Pending Prescriptions Disp Refills   atorvastatin (LIPITOR) 40 MG tablet [Pharmacy Med Name: ATORVASTATIN 40 MG TABLET] 90 tablet     Sig: Take 1 tablet (40 mg total) by mouth daily at 6 PM.      Cardiovascular:  Antilipid - Statins Passed - 09/06/2019 11:17 AM      Passed - Total Cholesterol in normal range and within 360 days    Cholesterol, Total  Date Value Ref Range Status  08/15/2019 156 100 - 199 mg/dL Final          Passed - LDL in normal range and within 360 days    LDL Chol Calc (NIH)  Date Value Ref Range Status  08/15/2019 85 0 - 99 mg/dL Final          Passed - HDL in normal range and within 360 days    HDL  Date Value Ref Range Status  08/15/2019 46 >39 mg/dL Final          Passed - Triglycerides in normal range and within 360 days    Triglycerides  Date Value Ref Range Status  08/15/2019 144 0 - 149 mg/dL Final          Passed - Patient is not pregnant      Passed - Valid encounter within last 12 months    Recent Outpatient Visits           3 weeks ago Mixed hyperlipidemia   [facility] [place], Dionne Bucy, MD   2 months ago History of lacunar cerebrovascular accident (CVA)   [place], Dionne Bucy, MD   3 months ago Encounter for annual wellness visit (AWV) in Medicare patient   [place], Dionne Bucy, MD   9 months ago [place] Bacigalupo, Dionne Bucy, MD   10 months ago [facility] (sub[facility]ach[facility]oid hemorrhage) [facility])   [place], Dionne Bucy, MD       Future Appointments             In 3 days [name], MD [place]   In 1 month Bacigalupo, Dionne Bucy, MD [facility], [place]   In 3 months [name], MD [place]   In 4 months End, Harrell Gave, MD [facility], [place]

## 2019-09-06 NOTE — Telephone Encounter (Signed)
Ok to send 90 day supply with 3 refills of statin

## 2019-09-06 NOTE — Telephone Encounter (Signed)
Medication Refill - Medication: Atorvastatin 32m  Has the patient contacted their pharmacy? Yes.   (Agent: If no, request that the patient contact the pharmacy for the refill.) (Agent: If yes, when and what did the pharmacy advise?)  Preferred Pharmacy (with phone number or street name): [id number] [id number] Agent: Please be advised that RX refills may take up to 3 business days. We ask that you follow-up with your pharmacy.

## 2019-09-09 ENCOUNTER — Other Ambulatory Visit: Payer: Self-pay

## 2019-09-09 ENCOUNTER — Ambulatory Visit (INDEPENDENT_AMBULATORY_CARE_PROVIDER_SITE_OTHER): Payer: Medicare Other | Admitting: Dermatology

## 2019-09-09 ENCOUNTER — Encounter: Payer: Self-pay | Admitting: Dermatology

## 2019-09-09 DIAGNOSIS — I639 Cerebral infarction, unspecified: Secondary | ICD-10-CM | POA: Diagnosis not present

## 2019-09-09 DIAGNOSIS — D492 Neoplasm of unspecified behavior of bone, soft tissue, and skin: Secondary | ICD-10-CM

## 2019-09-09 DIAGNOSIS — Z85828 Personal history of other malignant neoplasm of skin: Secondary | ICD-10-CM

## 2019-09-09 DIAGNOSIS — Z872 Personal history of diseases of the skin and subcutaneous tissue: Secondary | ICD-10-CM

## 2019-09-09 DIAGNOSIS — C44311 Basal cell carcinoma of skin of nose: Secondary | ICD-10-CM | POA: Diagnosis not present

## 2019-09-09 NOTE — Progress Notes (Signed)
   Follow-Up Visit   Subjective  Cameron Foster. is a 79 y.o. male who presents for the following: bump (check bump on R nose, present ~20m no symptoms, pt has had MOHs in past near this area so he was concerned). H/o AKs with cryotherapy to multiple sites last visit.  This spot wasn't present then.   The following portions of the chart were reviewed this encounter and updated as appropriate:      Review of Systems:  No other skin or systemic complaints except as noted in HPI or Assessment and Plan.  Objective  Well appearing patient in no apparent distress; mood and affect are within normal limits.  A focused examination was performed including face. Relevant physical exam findings are noted in the Assessment and Plan.  Objective  R nasal ala: 3.536mpink scaly pap      Assessment & Plan  Neoplasm of skin R nasal ala  Skin / nail biopsy Type of biopsy: tangential   Informed consent: discussed and consent obtained   Anesthesia: the lesion was anesthetized in a standard fashion   Anesthesia comment:  Area prepped with alcohol Anesthetic:  1% lidocaine w/ epinephrine 1-100,000 buffered w/ 8.4% NaHCO3 Instrument used: flexible razor blade   Hemostasis achieved with: pressure, aluminum chloride and electrodesiccation   Outcome: patient tolerated procedure well    Destruction of lesion  Destruction method: electrodesiccation and curettage   Informed consent: discussed and consent obtained   Timeout:  patient name, date of birth, surgical site, and procedure verified Curettage performed in three different directions: Yes   Electrodesiccation performed over the curetted area: Yes   Lesion length (cm):  0.3 Lesion width (cm):  0.3 Margin per side (cm):  0.1 Final wound size (cm):  0.5 Hemostasis achieved with:  pressure, aluminum chloride and electrodesiccation Outcome: patient tolerated procedure well with no complications   Post-procedure details: wound care instructions  given   Additional details:  Mupirocin ointment and Bandaid applied    Specimen 1 - Surgical pathology Differential Diagnosis: D48.5 Sebaceous hyperplasia vs Cyst r/o BCC Check Margins: No 3.38m76mink scaly pap EDC today  Sebaceous Hyperplasia vs Cyst r/o BCC  Return for As scheduled for 2m 538m.   I, SonyOthelia PullingA, am acting as scribe for TaraBrendolyn Patty .  Documentation: I have reviewed the above documentation for accuracy and completeness, and I agree with the above.  TaraBrendolyn Patty

## 2019-09-09 NOTE — Patient Instructions (Addendum)
Wound Care Instructions  1. Cleanse wound gently with soap and water once a day then pat dry with clean gauze. Apply a thing coat of Petrolatum (petroleum jelly, "Vaseline") over the wound (unless you have an allergy to this). We recommend that you use a new, sterile tube of Vaseline. Do not pick or remove scabs. Do not remove the yellow or Scalzo "healing tissue" from the base of the wound.  2. Cover the wound with fresh, clean, nonstick gauze and secure with paper tape. You may use Band-Aids in place of gauze and tape if the would is small enough, but would recommend trimming much of the tape off as there is often too much. Sometimes Band-Aids can irritate the skin.  3. You should call the office for your biopsy report after 1 week if you have not already been contacted.  4. If you experience any problems, such as abnormal amounts of bleeding, swelling, significant bruising, significant pain, or evidence of infection, please call the office immediately.  5. FOR ADULT SURGERY PATIENTS: If you need something for pain relief you may take 1 extra strength Tylenol (acetaminophen) AND 2 Ibuprofen (262m each) together every 4 hours as needed for pain. (do not take these if you are allergic to them or if you have a reason you should not take them.) Typically, you may only need pain medication for 1 to 3 days.    [place]

## 2019-09-11 ENCOUNTER — Telehealth: Payer: Self-pay

## 2019-09-11 ENCOUNTER — Other Ambulatory Visit: Payer: Self-pay | Admitting: Orthopedic Surgery

## 2019-09-11 DIAGNOSIS — M542 Cervicalgia: Secondary | ICD-10-CM | POA: Diagnosis not present

## 2019-09-11 DIAGNOSIS — M545 Low back pain: Secondary | ICD-10-CM | POA: Diagnosis not present

## 2019-09-11 DIAGNOSIS — M5489 Other dorsalgia: Secondary | ICD-10-CM | POA: Diagnosis not present

## 2019-09-11 DIAGNOSIS — M5412 Radiculopathy, cervical region: Secondary | ICD-10-CM | POA: Diagnosis not present

## 2019-09-11 NOTE — Telephone Encounter (Signed)
-----   Message from [name], MD sent at 09/10/2019  7:30 PM EDT ----- Skin , right nasal ala BASAL CELL CARCINOMA, NODULAR PATTERN  Already treated with EDC, will observe.  If it recurs, would need Mohs

## 2019-09-11 NOTE — Telephone Encounter (Signed)
Advised pt of bx results/sh ?

## 2019-09-27 ENCOUNTER — Other Ambulatory Visit: Payer: Self-pay

## 2019-09-27 ENCOUNTER — Ambulatory Visit
Admission: RE | Admit: 2019-09-27 | Discharge: 2019-09-27 | Disposition: A | Discharge status: Home / Self Care | Payer: Medicare Other | Source: Ambulatory Visit | Attending: Orthopedic Surgery | Admitting: Orthopedic Surgery

## 2019-09-27 DIAGNOSIS — M5412 Radiculopathy, cervical region: Secondary | ICD-10-CM | POA: Insufficient documentation

## 2019-09-27 DIAGNOSIS — M542 Cervicalgia: Secondary | ICD-10-CM | POA: Diagnosis not present

## 2019-09-27 IMAGING — MR MR CERVICAL SPINE W/O CM
5 series · 39 of 48 positions shown · non-contrast
Comparison: CT of the cervical spine [DATE]. Brain MRI
[DATE]

CLINICAL DATA: Left-sided neck pain and popping for 1.5 months. No
trauma.

EXAM:
MRI CERVICAL SPINE WITHOUT CONTRAST
TECHNIQUE: Multiplanar, multisequence MR imaging of the cervical spine was
performed. No intravenous contrast was administered.

[Series 5: T2 · sagittal · 3.0mm · 0.66mm/px · 6 of 15 slices shown (1 of 2)]
[im 1/15]
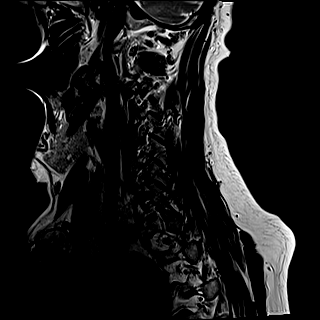
[im 3/15]
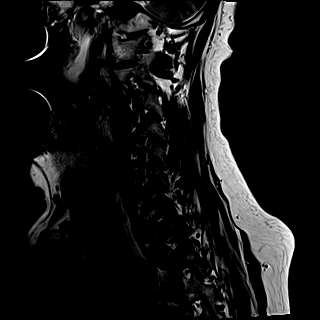
[im 6/15]
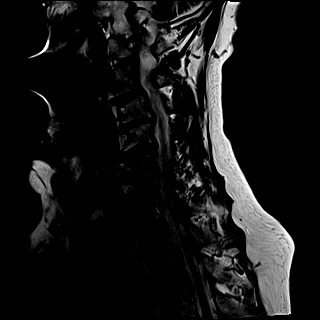
[im 9/15]
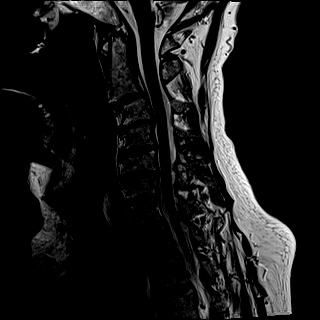
[im 12/15]
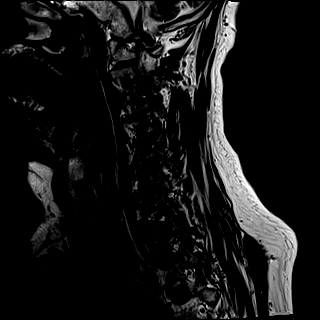
[im 15/15]
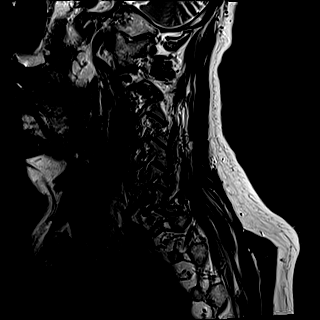

[Series 6: FLAIR · sagittal · 3.0mm · 0.82mm/px · 7 of 15 slices shown]
[im 1/15]
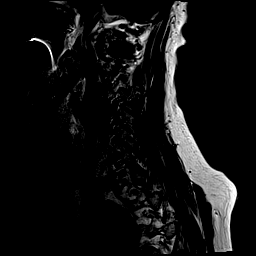
[im 3/15]
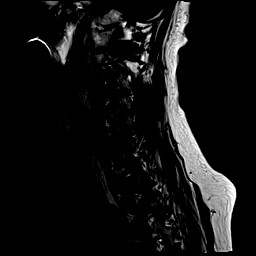
[im 5/15]
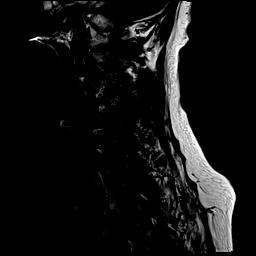
[im 8/15]
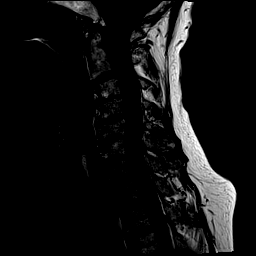
[im 10/15]
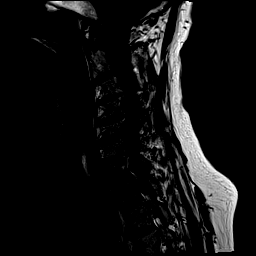
[im 12/15]
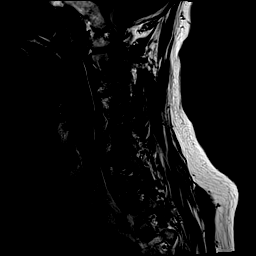
[im 15/15]
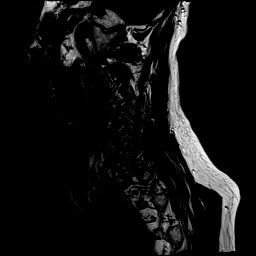

[Series 7: STIR · sagittal · 3.0mm · 0.66mm/px · 7 of 15 slices shown]
[im 1/15]
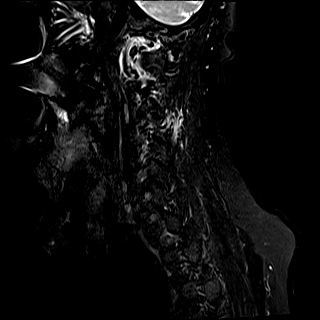
[im 3/15]
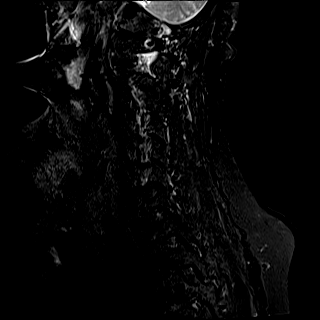
[im 5/15]
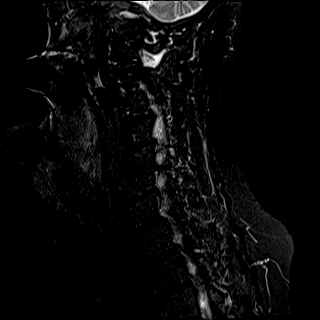
[im 8/15]
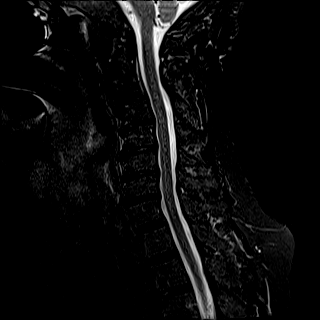
[im 10/15]
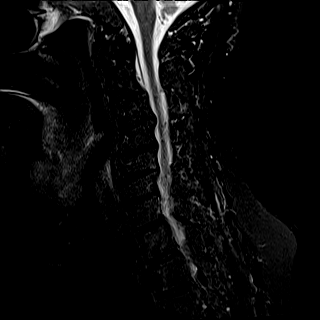
[im 12/15]
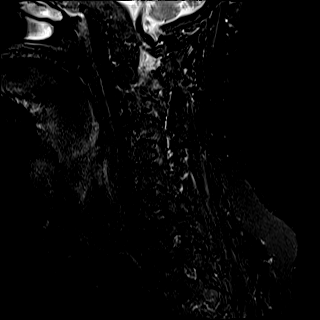
[im 15/15]
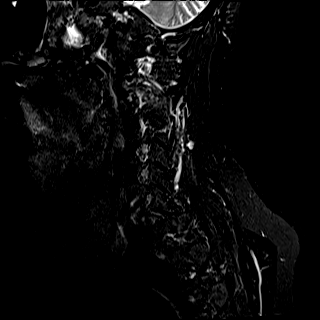

[Series 8: T2 · axial · 3.0mm · 0.70mm/px · z∈[-142,-39]mm · 11 of 31 slices shown (2 of 2)]
[im 1/31]
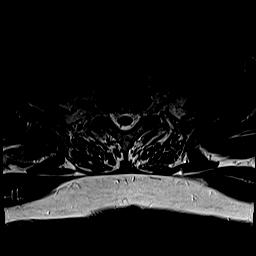
[im 3/31]
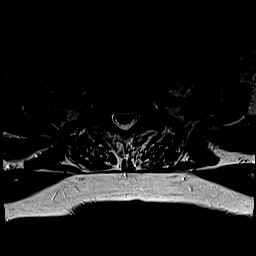
[im 5/31]
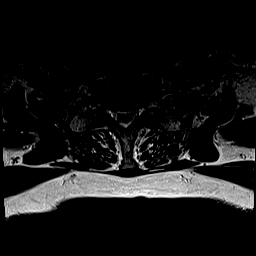
[im 7/31]
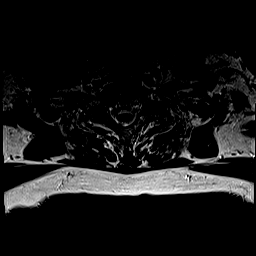
[im 10/31]
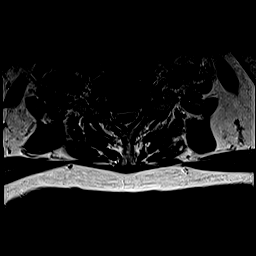
[im 12/31]
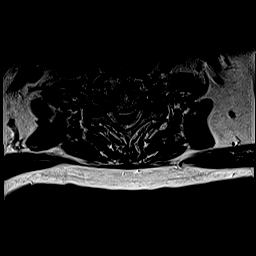
[im 14/31]
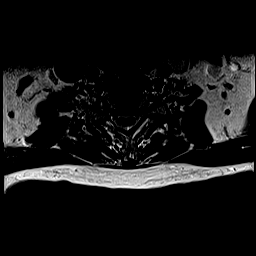
[im 17/31]
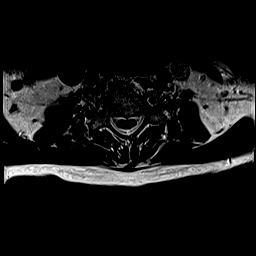
[im 21/31]
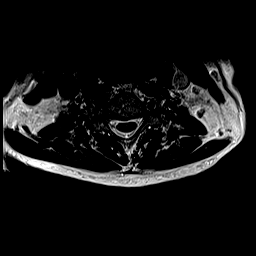
[im 26/31]
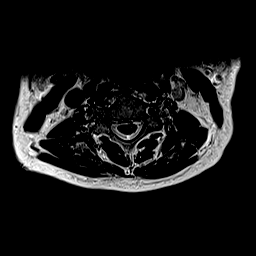
[im 31/31]
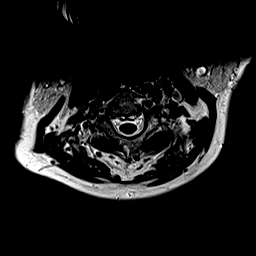

[Series 9: ax mpgr · axial · 3.0mm · 0.35mm/px · z∈[-142,-39]mm · 8 of 31 slices shown]
[im 1/31]
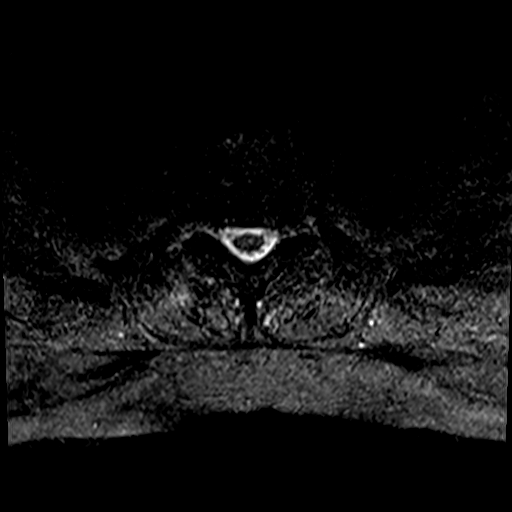
[im 5/31]
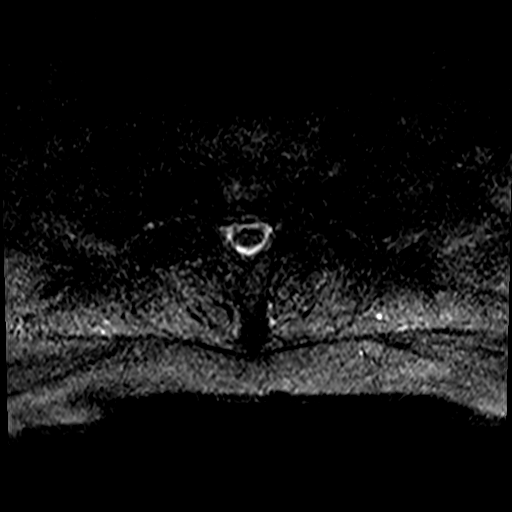
[im 10/31]
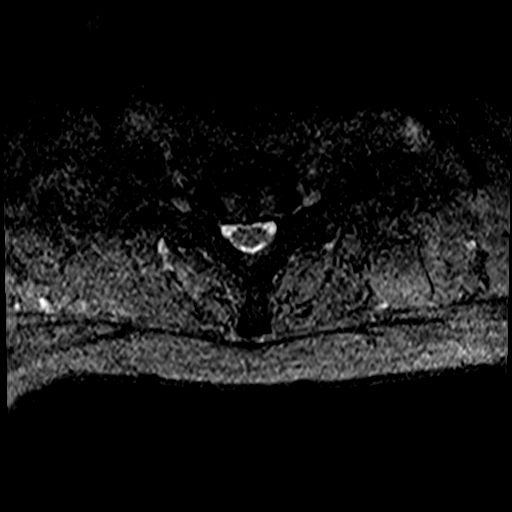
[im 14/31]
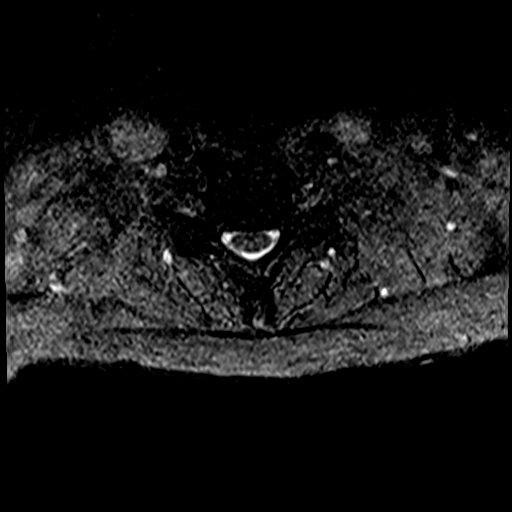
[im 17/31]
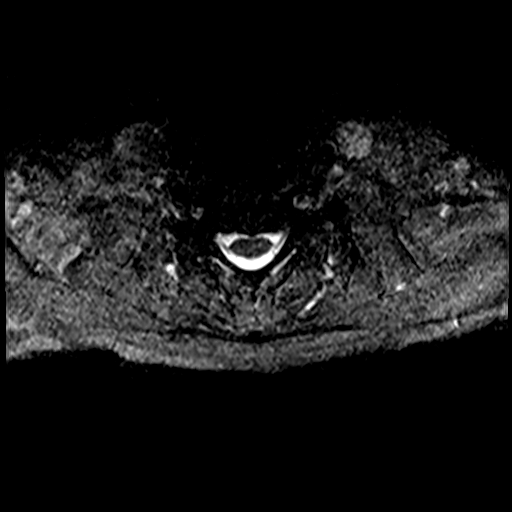
[im 21/31]
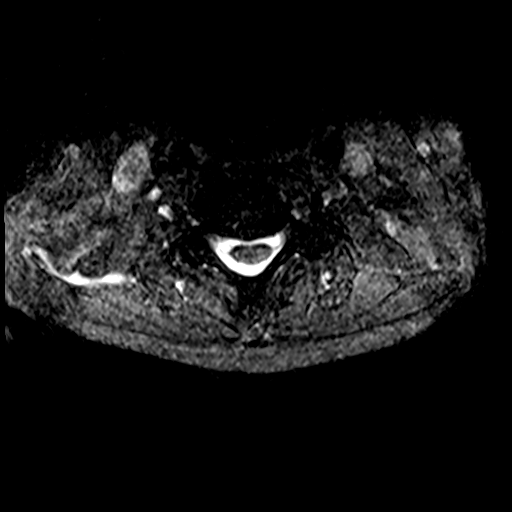
[im 26/31]
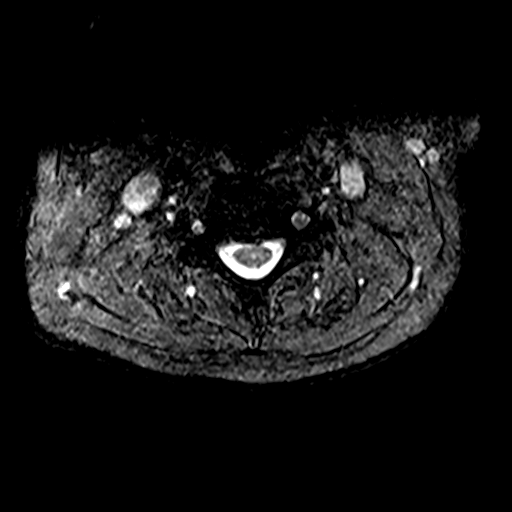
[im 31/31]
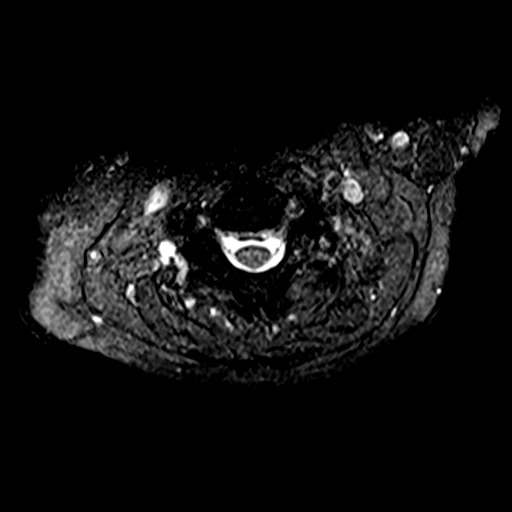

[39 of 48 positions shown; findings below may reference images not displayed]

FINDINGS: Alignment: Reversal of cervical lordosis and 2 mm of C2-3
anterolisthesis, the latter progressed from comparison MRI

Vertebrae: Prominent marrow edema about the left C2-3 facet. No
fracture or discitis.

Cord: Normal signal and morphology.

Posterior Fossa, vertebral arteries, paraspinal tissues: Negative.

Disc levels:

C2-3: Facet osteoarthritis with asymmetric left-sided spurring and
marrow edema. Mild anterolisthesis. Moderate left foraminal
narrowing and patent spinal canal

C3-4: Disc narrowing with endplate and uncovertebral ridging. Facet
degeneration is minimal. Uncovertebral spurs cause moderate to
advanced right more than left foraminal stenosis. Patent spinal
canal

C4-5: Spondylosis with disc narrowing and uncovertebral ridging.
Left more than right foraminal impingement

C5-6: Spondylosis with disc narrowing and uncovertebral ridging.
Prominent biforaminal impingement. Widely patent spinal canal

C6-7: Disc degeneration with narrowing and endplate/uncovertebral
ridging. Advanced biforaminal impingement.

C7-T1:Mild facet spurring.  No herniation or impingement
IMPRESSION: 1. C2-3 left facet arthritis with marrow edema that has developed
from a brain MRI [DATE], often symptomatic. There is also
progressive anterolisthesis with moderate left foraminal narrowing
at this level.
2. C3-4 to C6-7 bilateral foraminal stenosis from disc height loss
and uncovertebral spurring, most advanced at C5-6 and C6-7.

## 2019-10-18 ENCOUNTER — Other Ambulatory Visit: Payer: Self-pay | Admitting: Physical Medicine & Rehabilitation

## 2019-10-18 ENCOUNTER — Ambulatory Visit
Admission: EM | Admit: 2019-10-18 | Discharge: 2019-10-18 | Disposition: A | Discharge status: Home / Self Care | Payer: Medicare Other

## 2019-10-18 ENCOUNTER — Other Ambulatory Visit: Payer: Self-pay

## 2019-10-18 DIAGNOSIS — J0100 Acute maxillary sinusitis, unspecified: Secondary | ICD-10-CM | POA: Diagnosis not present

## 2019-10-18 DIAGNOSIS — M5416 Radiculopathy, lumbar region: Secondary | ICD-10-CM

## 2019-10-18 DIAGNOSIS — M4802 Spinal stenosis, cervical region: Secondary | ICD-10-CM | POA: Insufficient documentation

## 2019-10-18 DIAGNOSIS — G8929 Other chronic pain: Secondary | ICD-10-CM | POA: Insufficient documentation

## 2019-10-18 MED ORDER — AMOXICILLIN 875 MG PO TABS: 875.0000 mg | ORAL_TABLET | Freq: Two times a day (BID) | ORAL | 0 refills | Status: AC

## 2019-10-18 NOTE — ED Triage Notes (Signed)
Pt is here with a cough and nasal congestion that started Tuesday, pt has not taken anything to relieve discomfort.

## 2019-10-18 NOTE — Discharge Instructions (Addendum)
Take the amoxicillin as directed.  Take over-the-counter plain Mucinex for congestion.    Follow up with your primary care provider if your symptoms are not improving.

## 2019-10-18 NOTE — ED Provider Notes (Signed)
Cameron Foster    CSN: [id number] Arrival date & time: 10/18/19  1426      History   Chief Complaint Chief Complaint  Patient presents with  . Cough  . Nasal Congestion    HPI Cameron Foster. is a 79 y.o. male.  Patient presents with 1 week history of cough productive of yellow phlegm, sinus pressure, nasal congestion, postnasal drip. Treatment attempted at home with ibuprofen and OTC sinus medication. He denies fever, chills, sore throat, shortness of breath, abdominal pain, vomiting, diarrhea, rash, or other symptoms.  The history is provided by the patient.    Past Medical History:  Diagnosis Date  . Alcoholism ([place])   . Arthritis   . Atrial fibrillation ([place])    one episode  . Basal cell carcinoma 04/26/2017   Right medial cheek. Superficial and nodular  . Basal cell carcinoma 06/11/2019   Left anterior shoulder. Nodular pattern  . Colon polyp   . Depression    Son died 03-Jul-2014  . Diverticulitis   . Diverticulosis 30 years  . Dysrhythmia    At Fib 1995  . GERD (gastroesophageal reflux disease)   . Hyperlipidemia   . Hypertension   . Irritable bowel syndrome   . Prostate cancer ([place]) July 03, 2010   treated with radiation therapy. Prostate  . Squamous cell carcinoma of skin 07/18/2017   Left medial calf. KA type  . Squamous cell carcinoma of skin 04/24/2018   Right above med. brow  . Squamous cell carcinoma of skin 06/11/2019   Right posterior shoulder. SCCis, hypertrophic  . Vasovagal syncope     Patient Active Problem List   Diagnosis Date Noted  . PSVT (paroxysmal supraventricular tachycardia) ([place]) 08/01/2019  . History of lacunar cerebrovascular accident (CVA) 07/01/2019  . Cerebrovascular accident (CVA) ([place]) 06/08/2019  . Paroxysmal atrial fibrillation ([place]) 11/07/2018  . TIA (transient ischemic attack) 10/20/2018  . History of subarachnoid hemorrhage 10/16/2018  . Recurrent sinusitis 10/16/2018  . Low libido 08/30/2018  . Obesity 05/14/2018  .  Hyperlipidemia LDL goal <70 05/14/2018  . Degeneration of lumbar intervertebral disc 03/22/2018  . Osteoarthritis of hip 03/22/2018  . Trochanteric bursitis of right hip 03/22/2018  . Status post partial colectomy 01/03/2017  . Depression, major, single episode, mild ([name]) 01/03/2017  . Vasovagal syncope 01/27/2016  . History of skin cancer 10/28/2015  . Osteoarthritis of right knee 10/01/2015  . Essential hypertension 08/25/2015  . Dyspnea on exertion 08/12/2015  . Erectile dysfunction following radiation therapy 08/04/2015  . Constipation 08/02/2015  . History of prostate cancer 01/20/2015  . Swelling of right lower extremity 01/20/2015    Past Surgical History:  Procedure Laterality Date  . [place] no stents  . CATARACT EXTRACTION, BILATERAL    . COLON RESECTION SIGMOID N/A 12/07/2016   Procedure: COLON RESECTION SIGMOID;  Surgeon: Clayburn Pert, MD;  Location: [facility] ORS;  Service: General;  Laterality: N/A;  . COLON SURGERY  11/2016   Colostomy  . COLONOSCOPY  July 03, 2014  . COLONOSCOPY WITH PROPOFOL N/A 05/30/2016   Procedure: COLONOSCOPY WITH PROPOFOL;  Surgeon: Lollie Sails, MD;  Location[facility] ENDOSCOPY;  Service: Endoscopy;  Laterality: N/A;  . COLOSTOMY Left 12/07/2016   Procedure: COLOSTOMY;  Surgeon: Clayburn Pert, MD;  Location: [facility] ORS;  Service: General;  Laterality: Left;  . COLOSTOMY REVERSAL N/A 03/21/2017   Procedure: COLOSTOMY REVERSAL;  Surgeon: Clayburn Pert, MD;  Location: [facility] ORS;  Service: General;  Laterality: N/A;  .  COLOSTOMY TAKEDOWN N/A 03/21/2017   Procedure: LAPAROSCOPIC COLOSTOMY TAKEDOWN;  Surgeon: Clayburn Pert, MD;  Location: [facility] ORS;  Service: General;  Laterality: N/A;  . CYSTOSCOPY WITH STENT PLACEMENT Bilateral 03/21/2017   Procedure: CYSTOSCOPY WITH LIGHTED STENT PLACEMENT;  Surgeon: Abbie Sons, MD;  Location: [facility] ORS;  Service: Urology;  Laterality: Bilateral;  .  ESOPHAGOGASTRODUODENOSCOPY    . ESOPHAGOGASTRODUODENOSCOPY N/A 05/30/2016   Procedure: ESOPHAGOGASTRODUODENOSCOPY (EGD);  Surgeon: Lollie Sails, MD;  Location: [facility] ENDOSCOPY;  Service: Endoscopy;  Laterality: N/A;  . EYE SURGERY     cataracts  . FLEXIBLE SIGMOIDOSCOPY N/A 03/21/2017   Procedure: FLEXIBLE SIGMOIDOSCOPY;  Surgeon: Clayburn Pert, MD;  Location: [facility] ORS;  Service: General;  Laterality: N/A;  . FRACTURE SURGERY Bilateral    right arm and left wrist  . INCISION AND DRAINAGE ABSCESS N/A 12/07/2016   Procedure: DRAINAGE  OF INTRA ABDOMINAL ABSCESS;  Surgeon: Clayburn Pert, MD;  Location: [facility] ORS;  Service: General;  Laterality: N/A;  . KNEE ARTHROSCOPY    . LAPAROTOMY N/A 12/07/2016   Procedure: EXPLORATORY LAPAROTOMY;  Surgeon: Clayburn Pert, MD;  Location: [facility] ORS;  Service: General;  Laterality: N/A;  . PROSTATE SURGERY     Microwave therapy  . TONSILLECTOMY    . TOTAL KNEE ARTHROPLASTY Right 07/27/2016   Procedure: TOTAL KNEE ARTHROPLASTY;  Surgeon: [name], MD;  Location: [facility] ORS;  Service: Orthopedics;  Laterality: Right;  Dr. Erlene Quan had to place Urinary catheter due to prostate cancer history.  Using flexible scope.       Home Medications    Prior to Admission medications   Medication Sig Start Date End Date Taking? Authorizing Provider  amLODipine (NORVASC) 10 MG tablet Take by mouth. 07/31/19  Yes [provider]  metoprolol succinate (TOPROL-XL) 50 MG 24 hr tablet Take 50 mg by mouth once daily 07/22/19  Yes [provider]  amLODipine (NORVASC) 10 MG tablet TAKE 1 TABLET BY MOUTH EVERY DAY 04/25/19   Bacigalupo, Dionne Bucy, MD  amoxicillin (AMOXIL) 875 MG tablet Take 1 tablet (875 mg total) by mouth 2 (two) times daily for 7 days. 10/18/19 10/25/19  Sharion Balloon, NP  aspirin 81 MG EC tablet Take 81 mg by mouth daily.     [provider]  atorvastatin (LIPITOR) 40 MG tablet Take 1 tablet (40 mg total) by mouth daily. 09/06/19  12/05/19  [name], MD  atorvastatin (LIPITOR) 40 MG tablet Take by mouth.    [provider]  Cyanocobalamin (VITAMIN B 12 PO) Take 1,000 mcg by mouth daily.    [provider]  docusate sodium (STOOL SOFTENER) 100 MG capsule Take 1 capsule (100 mg total) by mouth daily. 06/10/19   Enzo Bi, MD  fluticasone (FLONASE) 50 MCG/ACT nasal spray PLACE 1 SPRAY INTO BOTH NOSTRILS DAILY AS NEEDED FOR ALLERGIES OR RHINITIS. 08/07/19   [name], MD  furosemide (LASIX) 20 MG tablet TAKE 1 TABLET BY MOUTH EVERY DAY 08/21/19   End, Harrell Gave, MD  hydrocortisone 2.5 % cream APPLY TO AFFECTED AREA TWICE A DAY AS NEEDED FOR RASH 09/03/19   Bacigalupo, Dionne Bucy, MD  lisinopril (ZESTRIL) 10 MG tablet TAKE 1 TABLET BY MOUTH EVERY DAY 01/07/19   [name], MD  meclizine (ANTIVERT) 25 MG tablet Take 25 mg by mouth 2 (two) times daily as needed. 05/03/19   [provider]  meloxicam (MOBIC) 15 MG tablet Take 1 tablet (15 mg total) by mouth daily as needed for pain.  06/10/19   Enzo Bi, MD  metoprolol succinate (TOPROL-XL) 50 MG 24 hr tablet TAKE 1 TABLET (50 MG TOTAL) BY MOUTH DAILY. TAKE WITH OR IMMEDIATELY FOLLOWING A MEAL. 07/22/19   Bacigalupo, Dionne Bucy, MD  Multiple Vitamin (MULTIVITAMIN WITH MINERALS) TABS tablet Take 1 tablet by mouth daily. 10/24/18   Vaughan Basta, MD  omeprazole (PRILOSEC) 20 MG capsule Take 1 capsule (20 mg total) by mouth daily. 06/10/19   Enzo Bi, MD  sertraline (ZOLOFT) 100 MG tablet Take 1 tablet (100 mg total) by mouth daily. 07/31/19   [name], MD  triamcinolone cream (KENALOG) 0.1 % Apply to affected area of skin twice daily for itch  Avoid Face, Groin and Underarms 08/20/19   [name], MD    Family History Family History  Problem Relation Age of Onset  . Lung cancer Father        smoker  . Other Mother   . Sudden death Son        due to "Blood clots"  . Bipolar disorder Son   . Kidney disease  Daughter        congenital one small kidney  . Prostate cancer Neg Hx   . Bladder Cancer Neg Hx     Social History Social History   Tobacco Use  . Smoking status: Former Smoker    Packs/day: 1.00    Years: 27.00    Pack years: 27.00    Types: Cigarettes    Quit date: 04/18/1988    Years since quitting: 31.5  . Smokeless tobacco: Former Systems developer    Types: Secondary school teacher  . Vaping Use: Never used  Substance Use Topics  . Alcohol use: Yes    Alcohol/week: 3.0 standard drinks    Types: 3 Standard drinks or equivalent per week    Comment: Occasional- Former Heavy ETOH Use  . Drug use: No     Allergies   Formaldehyde and Tape   Review of Systems Review of Systems  Constitutional: Negative for chills and fever.  HENT: Positive for congestion, postnasal drip and sinus pressure. Negative for ear pain and sore throat.   Eyes: Negative for pain and visual disturbance.  Respiratory: Positive for cough. Negative for shortness of breath.   Cardiovascular: Negative for chest pain and palpitations.  Gastrointestinal: Negative for abdominal pain, diarrhea, nausea and vomiting.  Genitourinary: Negative for dysuria and hematuria.  Musculoskeletal: Negative for arthralgias and back pain.  Skin: Negative for color change and rash.  Neurological: Negative for seizures and syncope.  All other systems reviewed and are negative.    Physical Exam Triage Vital Signs ED Triage Vitals  Enc Vitals Group     BP 10/18/19 1436 119/76     Pulse Rate 10/18/19 1436 76     Resp 10/18/19 1436 18     Temp 10/18/19 1436 98.4 F (36.9 C)     Temp Source 10/18/19 1436 Tympanic     SpO2 10/18/19 1436 93 %     Weight --      Height --      Head Circumference --      Peak Flow --      Pain Score 10/18/19 1433 0     Pain Loc --      Pain Edu? --      Excl. in [place]? --    No data found.  Updated Vital Signs BP 119/76 (BP Location: Left Arm)   Pulse 76   Temp 98.4 F (  36.9 C) (Tympanic)   Resp  18   SpO2 93%   Visual Acuity Right Eye Distance:   Left Eye Distance:   Bilateral Distance:    Right Eye Near:   Left Eye Near:    Bilateral Near:     Physical Exam Vitals and nursing note reviewed.  Constitutional:      General: He is not in acute distress.    Appearance: He is well-developed.  HENT:     Head: Normocephalic and atraumatic.     Right Ear: Tympanic membrane normal.     Left Ear: Tympanic membrane normal.     Nose: Congestion and rhinorrhea present.     Mouth/Throat:     Mouth: Mucous membranes are moist.     Pharynx: Oropharynx is clear.  Eyes:     Conjunctiva/sclera: Conjunctivae normal.  Cardiovascular:     Rate and Rhythm: Normal rate and regular rhythm.     Heart sounds: No murmur heard.   Pulmonary:     Effort: Pulmonary effort is normal. No respiratory distress.     Breath sounds: Normal breath sounds. No wheezing or rhonchi.  Abdominal:     Palpations: Abdomen is soft.     Tenderness: There is no abdominal tenderness. There is no guarding or rebound.  Musculoskeletal:     Cervical back: Neck supple.  Skin:    General: Skin is warm and dry.     Findings: No rash.  Neurological:     General: No focal deficit present.     Mental Status: He is alert and oriented to person, place, and time.  Psychiatric:        Mood and Affect: Mood normal.        Behavior: Behavior normal.      UC Treatments / Results  Labs (all labs ordered are listed, but only abnormal results are displayed) Labs Reviewed - No data to display  EKG   Radiology No results found.  Procedures Procedures (including critical care time)  Medications Ordered in UC Medications - No data to display  Initial Impression / Assessment and Plan / UC Course  I have reviewed the triage vital signs and the nursing notes.  Pertinent labs & imaging results that were available during my care of the patient were reviewed by me and considered in my medical decision making (see  chart for details).   Acute sinusitis. Treating with amoxicillin. Counseled patient not to take over-the-counter cold medication without discussing with his PCP due to his medical history. Discussed that he can take plain over-the-counter Mucinex as needed for congestion. Instructed him to follow-up with his PCP if his symptoms are not improving. Patient agrees to plan of care.   Final Clinical Impressions(s) / UC Diagnoses   Final diagnoses:  Acute non-recurrent maxillary sinusitis     Discharge Instructions     Take the amoxicillin as directed.  Take over-the-counter plain Mucinex for congestion.    Follow up with your primary care provider if your symptoms are not improving.       ED Prescriptions    Medication Sig Dispense Auth. Provider   amoxicillin (AMOXIL) 875 MG tablet Take 1 tablet (875 mg total) by mouth 2 (two) times daily for 7 days. 14 tablet Sharion Balloon, NP     PDMP not reviewed this encounter.   Sharion Balloon, NP 10/18/19 [phone]

## 2019-10-19 LAB — SARS-COV-2, NAA 2 DAY TAT

## 2019-10-19 LAB — NOVEL CORONAVIRUS, NAA: SARS-CoV-2, NAA: NOT DETECTED

## 2019-10-22 ENCOUNTER — Other Ambulatory Visit: Payer: Self-pay | Admitting: Family Medicine

## 2019-10-24 ENCOUNTER — Other Ambulatory Visit: Payer: Self-pay | Admitting: Family Medicine

## 2019-10-28 ENCOUNTER — Other Ambulatory Visit: Payer: Self-pay

## 2019-10-28 ENCOUNTER — Ambulatory Visit
Admission: RE | Admit: 2019-10-28 | Discharge: 2019-10-28 | Disposition: A | Discharge status: Home / Self Care | Payer: Medicare Other | Source: Ambulatory Visit | Attending: Physical Medicine & Rehabilitation | Admitting: Physical Medicine & Rehabilitation

## 2019-10-28 ENCOUNTER — Other Ambulatory Visit: Payer: Self-pay | Admitting: Family Medicine

## 2019-10-28 DIAGNOSIS — M4807 Spinal stenosis, lumbosacral region: Secondary | ICD-10-CM | POA: Diagnosis not present

## 2019-10-28 DIAGNOSIS — M7138 Other bursal cyst, other site: Secondary | ICD-10-CM | POA: Diagnosis not present

## 2019-10-28 DIAGNOSIS — M48061 Spinal stenosis, lumbar region without neurogenic claudication: Secondary | ICD-10-CM | POA: Diagnosis not present

## 2019-10-28 DIAGNOSIS — M5416 Radiculopathy, lumbar region: Secondary | ICD-10-CM | POA: Diagnosis present

## 2019-10-28 DIAGNOSIS — M5136 Other intervertebral disc degeneration, lumbar region: Secondary | ICD-10-CM | POA: Diagnosis not present

## 2019-10-28 DIAGNOSIS — I10 Essential (primary) hypertension: Secondary | ICD-10-CM

## 2019-10-28 IMAGING — MR MR LUMBAR SPINE W/O CM
5 series · 31 of 48 positions shown · non-contrast
Comparison: Lumbar radiographs [DATE].

CLINICAL DATA: 79-year-old male with progressive central low back
pain and tailbone pain.

EXAM:
MRI LUMBAR SPINE WITHOUT CONTRAST
TECHNIQUE: Multiplanar, multisequence MR imaging of the lumbar spine was
performed. No intravenous contrast was administered.

[Series 5: T2 · sagittal · 4.0mm · 0.81mm/px · 7 of 17 slices shown (1 of 2)]
[im 1/17]
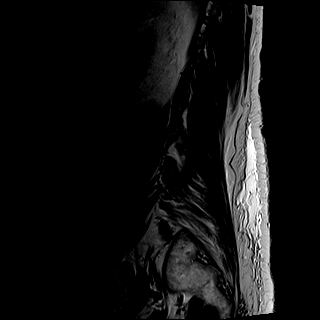
[im 3/17]
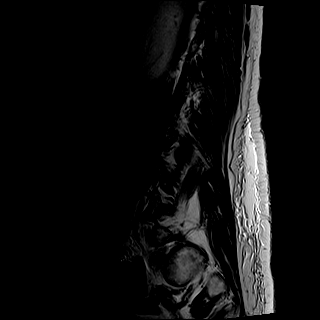
[im 6/17]
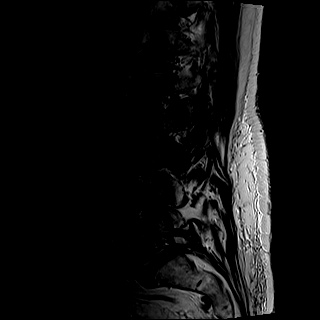
[im 9/17]
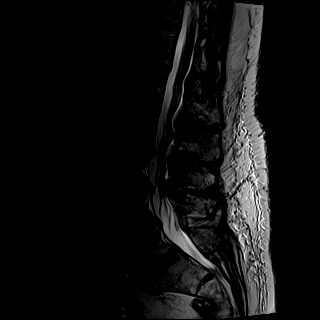
[im 11/17]
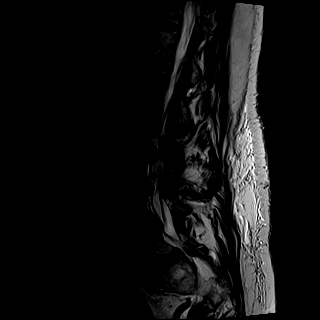
[im 14/17]
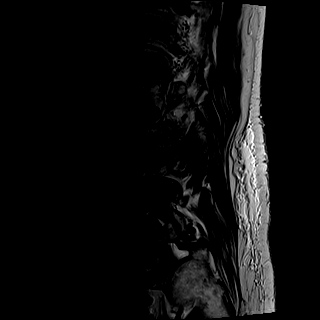
[im 17/17]
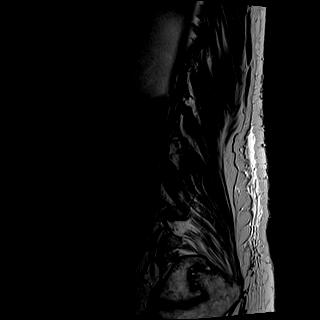

[Series 6: T1 · sagittal · 4.0mm · 0.81mm/px · 7 of 17 slices shown (1 of 2)]
[im 1/17]
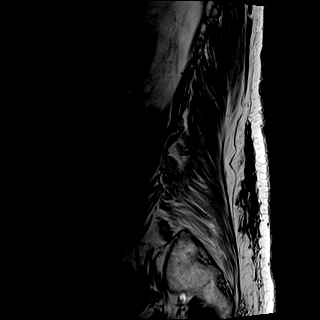
[im 3/17]
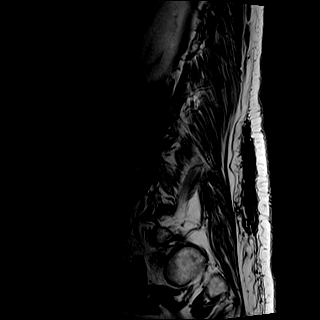
[im 6/17]
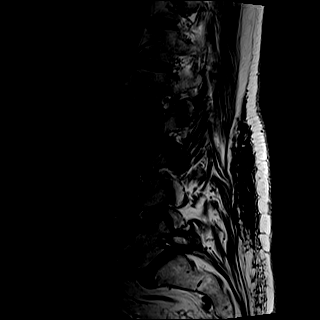
[im 9/17]
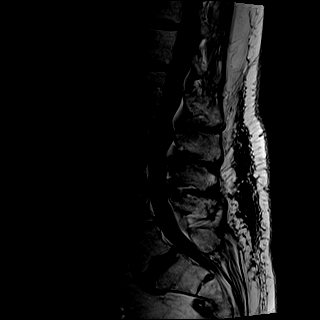
[im 11/17]
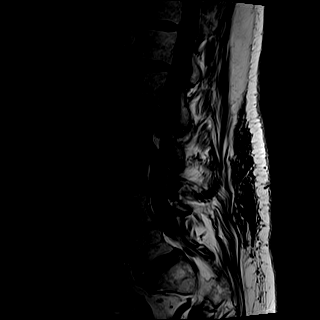
[im 14/17]
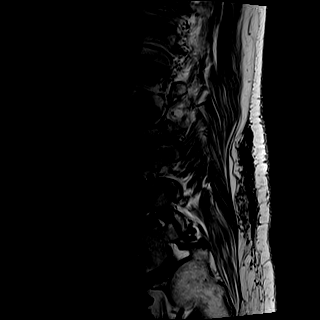
[im 17/17]
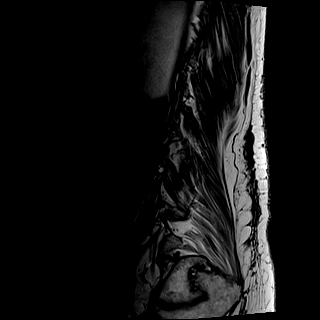

[Series 7: STIR · sagittal · 4.0mm · 0.41mm/px · 1 of 17 slices shown]
[im 1/17]
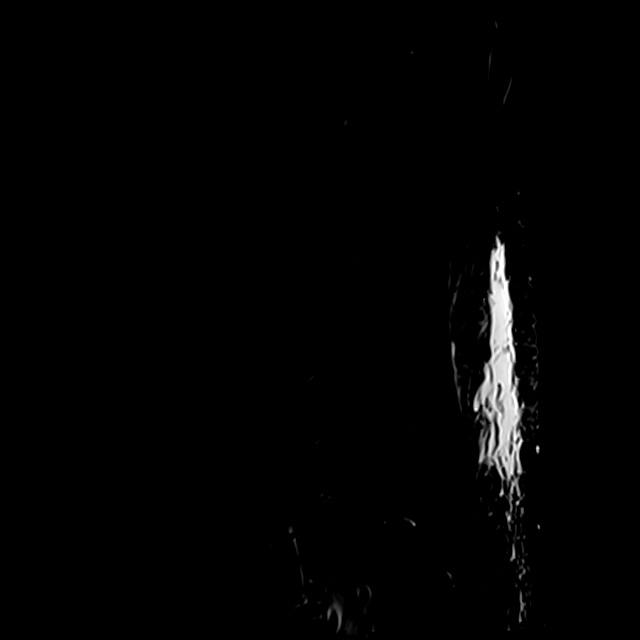

[Series 8: T2 · axial · 4.0mm · 0.78mm/px · z∈[-130,+84]mm · 8 of 38 slices shown (2 of 2)]
[im 1/38]
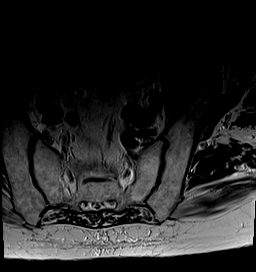
[im 6/38]
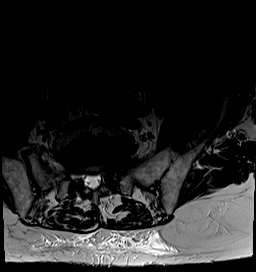
[im 12/38]
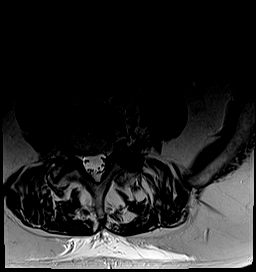
[im 18/38]
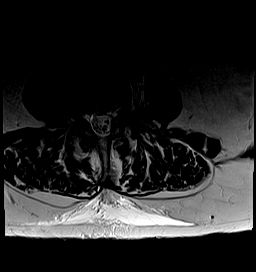
[im 20/38]
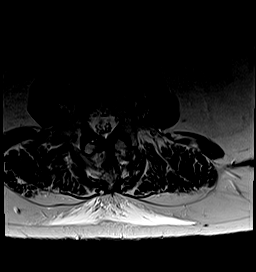
[im 26/38]
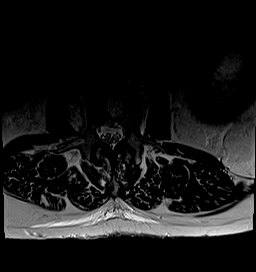
[im 32/38]
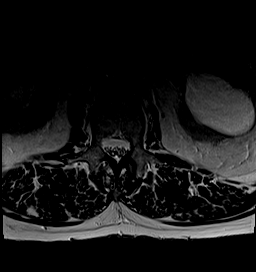
[im 38/38]
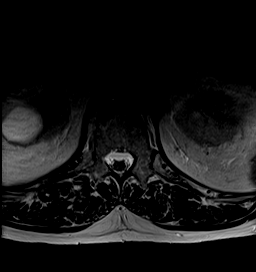

[Series 9: T1 · axial · 4.0mm · 0.39mm/px · z∈[-129,+85]mm · 8 of 38 slices shown (2 of 2)]
[im 1/38]
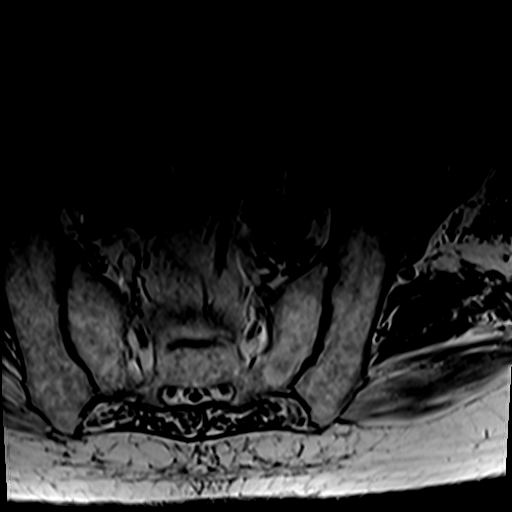
[im 6/38]
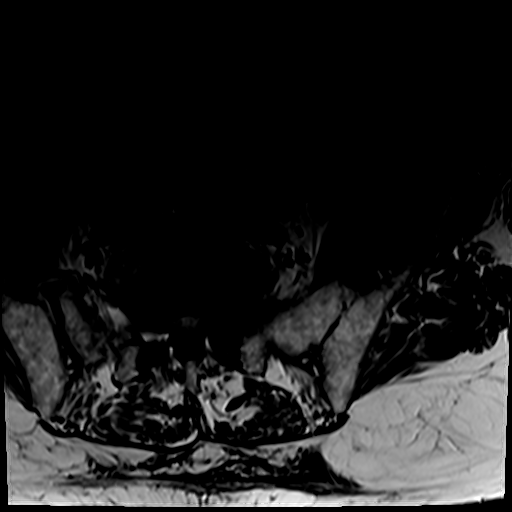
[im 12/38]
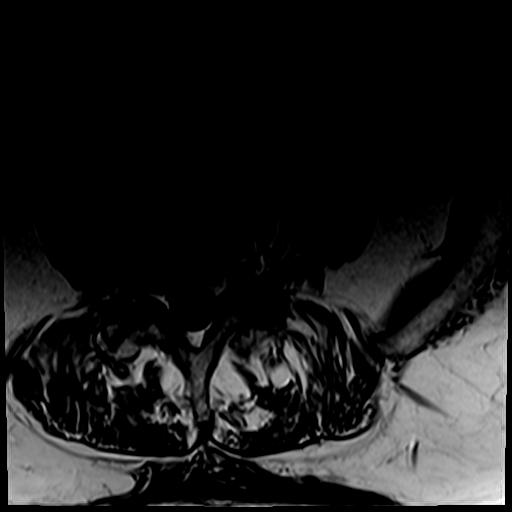
[im 18/38]
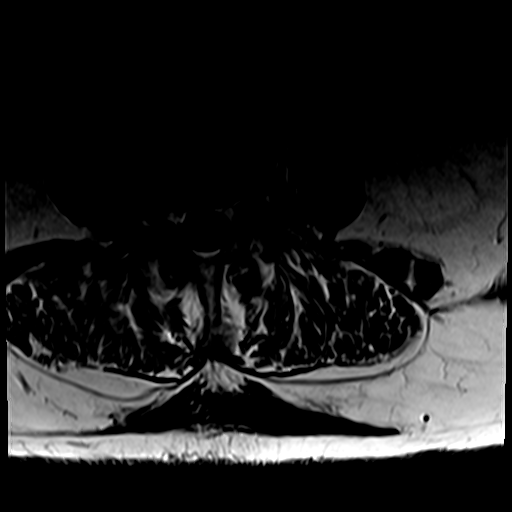
[im 20/38]
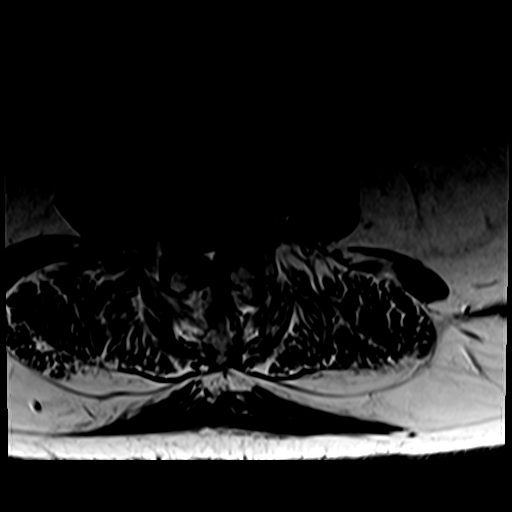
[im 26/38]
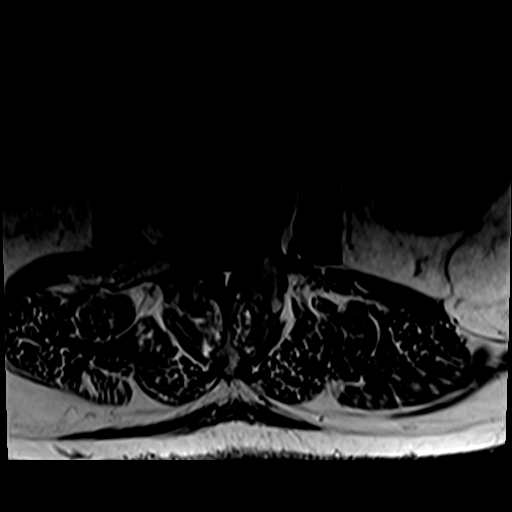
[im 32/38]
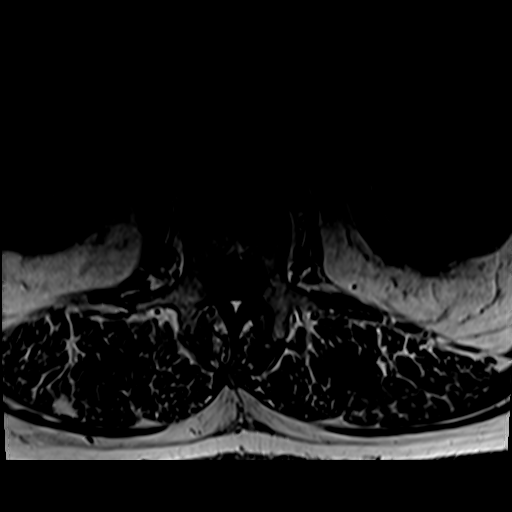
[im 38/38]
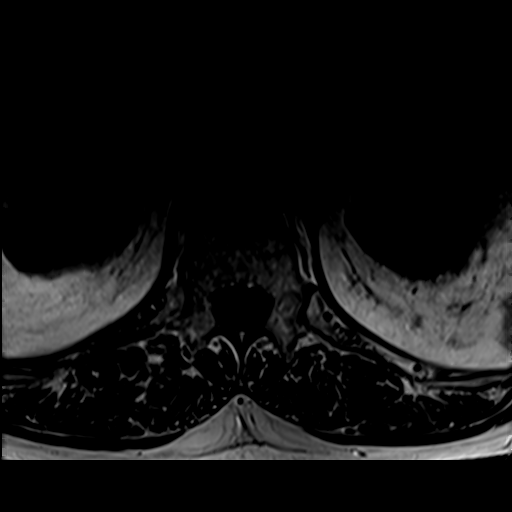

[31 of 48 positions shown; findings below may reference images not displayed]

FINDINGS: Segmentation: Lumbar segmentation appears to be normal on the
comparison.

Alignment: Preserved lumbar lordosis. Subtle anterolisthesis of L5
on S1. Mild dextroconvex lumbar scoliosis.

Vertebrae: Faint degenerative endplate and vertebral body marrow
edema at L2. Underlying advanced L2-L3 disc degeneration. No other
marrow edema or evidence of acute osseous abnormality. Normal
background bone marrow signal. Intact visible sacrum and SI joints.
The coccygeal segments are not included.

Conus medullaris and cauda equina: Conus extends to the T12-L1
level. No lower spinal cord or conus signal abnormality.

Paraspinal and other soft tissues: Benign appearing bilateral renal
cysts. Otherwise negative visible abdominal viscera. Partially
visible diverticulosis of the large bowel in the pelvis. Negative
lumbar paraspinal soft tissues.

Disc levels:

Negative visible lower thoracic levels through L1-L2.

L2-L3: Severe disc space loss, probable vacuum disc. Circumferential
disc osteophyte complex with mild to moderate posterior element
hypertrophy. Mild epidural lipomatosis.

Mild to moderate spinal stenosis. Mild to moderate left lateral
recess stenosis (left L3 nerve level). Moderate to severe bilateral
L2 foraminal stenosis.

L3-L4: Disc space loss with circumferential disc bulge and endplate
spurring plus broad-based central disc herniation (series 8, image
23). Mild to moderate posterior element hypertrophy. Less epidural
lipomatosis at this level. Mild spinal stenosis. Borderline to mild
left lateral recess stenosis (left L4 nerve level). Moderate to
severe left and mild to moderate right L3 foraminal stenosis.

L4-L5: Disc space loss with circumferential disc bulge and mild
endplate spurring. Moderate facet hypertrophy. No spinal or
convincing lateral recess stenosis. Moderate left greater than right
L4 foraminal stenosis.

L5-S1: Circumferential disc bulge and endplate spurring. Severe
facet hypertrophy greater on the left. No spinal stenosis. Moderate
left lateral recess stenosis (left S1 nerve level). Moderate left L5
foraminal stenosis, in part related to suspected left facet synovial
cyst (series 7, image 14). No right foraminal stenosis.
IMPRESSION: 1. Advanced disc degeneration at L2-L3 with mild degenerative marrow
edema. Up to moderate spinal and left lateral recess stenosis with
moderate to severe bilateral foraminal stenosis.

2. Mild multifactorial spinal stenosis at L3-L4 with moderate to
severe left neural foraminal stenosis.

3. L5-S1 moderate left lateral recess and left foraminal stenosis,
the latter in part due to facet synovial cyst.

4. L4-L5 moderate left greater than right foraminal stenosis.

## 2019-10-31 ENCOUNTER — Ambulatory Visit: Payer: Self-pay | Admitting: Family Medicine

## 2019-11-03 ENCOUNTER — Ambulatory Visit: Payer: Medicare Other

## 2019-11-07 ENCOUNTER — Ambulatory Visit: Payer: Medicare Other

## 2019-11-08 DIAGNOSIS — G8929 Other chronic pain: Secondary | ICD-10-CM | POA: Diagnosis not present

## 2019-11-08 DIAGNOSIS — M48061 Spinal stenosis, lumbar region without neurogenic claudication: Secondary | ICD-10-CM | POA: Diagnosis not present

## 2019-11-08 DIAGNOSIS — M4802 Spinal stenosis, cervical region: Secondary | ICD-10-CM | POA: Diagnosis not present

## 2019-11-08 DIAGNOSIS — M542 Cervicalgia: Secondary | ICD-10-CM | POA: Diagnosis not present

## 2019-11-08 DIAGNOSIS — M545 Low back pain: Secondary | ICD-10-CM | POA: Diagnosis not present

## 2019-11-11 ENCOUNTER — Encounter: Payer: Self-pay | Admitting: Emergency Medicine

## 2019-11-11 ENCOUNTER — Ambulatory Visit
Admission: EM | Admit: 2019-11-11 | Discharge: 2019-11-11 | Disposition: A | Discharge status: Home / Self Care | Payer: Medicare Other | Attending: Emergency Medicine | Admitting: Emergency Medicine

## 2019-11-11 ENCOUNTER — Other Ambulatory Visit: Payer: Self-pay

## 2019-11-11 DIAGNOSIS — J0100 Acute maxillary sinusitis, unspecified: Secondary | ICD-10-CM

## 2019-11-11 MED ORDER — DOXYCYCLINE HYCLATE 100 MG PO CAPS
100.0000 mg | ORAL_CAPSULE | Freq: Two times a day (BID) | ORAL | 0 refills | Status: DC
Start: 2019-11-11 — End: 2019-12-02

## 2019-11-11 NOTE — Discharge Instructions (Signed)
Take the doxycycline as directed.    Follow up with your primary care provider if your symptoms are not improving.

## 2019-11-11 NOTE — ED Provider Notes (Signed)
Cameron Foster    CSN: [id number] Arrival date & time: 11/11/19  1500      History   Chief Complaint Chief Complaint  Patient presents with   Cough   Drainage   Sinusitis    HPI Cameron Foster. is a 79 y.o. male.  Patient presents with ongoing sinus congestion, postnasal drip, and nonproductive cough.   He was seen here on 10/18/2019 for acute sinusitis and treated with amoxicillin; He did not take all of the amoxicillin because he was getting better.   He denies fever, chills, shortness of breath, abdominal pain, rash or other symptoms.    The history is provided by the patient.    Past Medical History:  Diagnosis Date   Alcoholism ([place])    Arthritis    Atrial fibrillation ([place])    one episode   Basal cell carcinoma 04/26/2017   Right medial cheek. Superficial and nodular   Basal cell carcinoma 06/11/2019   Left anterior shoulder. Nodular pattern   Colon polyp    Depression    Son died 06-30-2014   Diverticulitis    Diverticulosis 30 years   Dysrhythmia    At Fib June 30, 1993   GERD (gastroesophageal reflux disease)    Hyperlipidemia    Hypertension    Irritable bowel syndrome    Prostate cancer ([place]) 06-30-10   treated with radiation therapy. Prostate   Squamous cell carcinoma of skin 07/18/2017   Left medial calf. KA type   Squamous cell carcinoma of skin 04/24/2018   Right above med. brow   Squamous cell carcinoma of skin 06/11/2019   Right posterior shoulder. SCCis, hypertrophic   Vasovagal syncope     Patient Active Problem List   Diagnosis Date Noted   PSVT (paroxysmal supraventricular tachycardia) ([place]) 08/01/2019   History of lacunar cerebrovascular accident (CVA) 07/01/2019   Cerebrovascular accident (CVA) ([place]) 06/08/2019   Paroxysmal atrial fibrillation ([place]) 11/07/2018   TIA (transient ischemic attack) 10/20/2018   History of subarachnoid hemorrhage 10/16/2018   Recurrent sinusitis 10/16/2018   Low libido 08/30/2018    Obesity 05/14/2018   Hyperlipidemia LDL goal <70 05/14/2018   Degeneration of lumbar intervertebral disc 03/22/2018   Osteoarthritis of hip 03/22/2018   Trochanteric bursitis of right hip 03/22/2018   Status post partial colectomy 01/03/2017   Depression, major, single episode, mild ([place]) 01/03/2017   Vasovagal syncope 01/27/2016   History of skin cancer 10/28/2015   Osteoarthritis of right knee 10/01/2015   Essential hypertension 08/25/2015   Dyspnea on exertion 08/12/2015   Erectile dysfunction following radiation therapy 08/04/2015   Constipation 08/02/2015   History of prostate cancer 01/20/2015   Swelling of right lower extremity 01/20/2015    Past Surgical History:  Procedure Laterality Date   [place] no stents   CATARACT EXTRACTION, BILATERAL     COLON RESECTION SIGMOID N/A 12/07/2016   Procedure: COLON RESECTION SIGMOID;  Surgeon: Clayburn Pert, MD;  Location: [facility] ORS;  Service: General;  Laterality: N/A;   COLON SURGERY  11/2016   Colostomy   COLONOSCOPY  2014/06/30   COLONOSCOPY WITH PROPOFOL N/A 05/30/2016   Procedure: COLONOSCOPY WITH PROPOFOL;  Surgeon: Lollie Sails, MD;  Location: [facility] ENDOSCOPY;  Service: Endoscopy;  Laterality: N/A;   COLOSTOMY Left 12/07/2016   Procedure: COLOSTOMY;  Surgeon: Clayburn Pert, MD;  Location: [facility] ORS;  Service: General;  Laterality: Left;   COLOSTOMY REVERSAL N/A 03/21/2017   Procedure: COLOSTOMY REVERSAL;  Surgeon: Clayburn Pert,  MD;  Location: [facility] ORS;  Service: General;  Laterality: N/A;   COLOSTOMY TAKEDOWN N/A 03/21/2017   Procedure: LAPAROSCOPIC COLOSTOMY TAKEDOWN;  Surgeon: Clayburn Pert, MD;  Location: [facility] ORS;  Service: General;  Laterality: N/A;   CYSTOSCOPY WITH STENT PLACEMENT Bilateral 03/21/2017   Procedure: CYSTOSCOPY WITH LIGHTED STENT PLACEMENT;  Surgeon: Abbie Sons, MD;  Location: [facility] ORS;  Service: Urology;  Laterality: Bilateral;    ESOPHAGOGASTRODUODENOSCOPY     ESOPHAGOGASTRODUODENOSCOPY N/A 05/30/2016   Procedure: ESOPHAGOGASTRODUODENOSCOPY (EGD);  Surgeon: Lollie Sails, MD;  Location: [facility] ENDOSCOPY;  Service: Endoscopy;  Laterality: N/A;   EYE SURGERY     cataracts   FLEXIBLE SIGMOIDOSCOPY N/A 03/21/2017   Procedure: FLEXIBLE SIGMOIDOSCOPY;  Surgeon: Clayburn Pert, MD;  Location: [facility] ORS;  Service: General;  Laterality: N/A;   FRACTURE SURGERY Bilateral    right arm and left wrist   INCISION AND DRAINAGE ABSCESS N/A 12/07/2016   Procedure: DRAINAGE  OF INTRA ABDOMINAL ABSCESS;  Surgeon: Clayburn Pert, MD;  Location: [facility] ORS;  Service: General;  Laterality: N/A;   KNEE ARTHROSCOPY     LAPAROTOMY N/A 12/07/2016   Procedure: EXPLORATORY LAPAROTOMY;  Surgeon: Clayburn Pert, MD;  Location: [facility] ORS;  Service: General;  Laterality: N/A;   PROSTATE SURGERY     Microwave therapy   TONSILLECTOMY     TOTAL KNEE ARTHROPLASTY Right 07/27/2016   Procedure: TOTAL KNEE ARTHROPLASTY;  Surgeon: [name], MD;  Location: [facility] ORS;  Service: Orthopedics;  Laterality: Right;  Dr. Erlene Quan had to place Urinary catheter due to prostate cancer history.  Using flexible scope.       Home Medications    Prior to Admission medications   Medication Sig Start Date End Date Taking? Authorizing Provider  amLODipine (NORVASC) 10 MG tablet Take by mouth. 07/31/19   [provider]  amLODipine (NORVASC) 10 MG tablet TAKE 1 TABLET BY MOUTH EVERY DAY 10/28/19   Bacigalupo, Dionne Bucy, MD  aspirin 81 MG EC tablet Take 81 mg by mouth daily.     [provider]  atorvastatin (LIPITOR) 40 MG tablet Take 1 tablet (40 mg total) by mouth daily. 09/06/19 12/05/19  [name], MD  atorvastatin (LIPITOR) 40 MG tablet Take by mouth.    [provider]  Cyanocobalamin (VITAMIN B 12 PO) Take 1,000 mcg by mouth daily.    [provider]  docusate sodium (STOOL SOFTENER) 100 MG capsule Take 1  capsule (100 mg total) by mouth daily. 06/10/19   Enzo Bi, MD  doxycycline (VIBRAMYCIN) 100 MG capsule Take 1 capsule (100 mg total) by mouth 2 (two) times daily. 11/11/19   Sharion Balloon, NP  fluticasone (FLONASE) 50 MCG/ACT nasal spray PLACE 1 SPRAY INTO BOTH NOSTRILS DAILY AS NEEDED FOR ALLERGIES OR RHINITIS. 08/07/19   [name], MD  furosemide (LASIX) 20 MG tablet TAKE 1 TABLET BY MOUTH EVERY DAY 08/21/19   End, Harrell Gave, MD  hydrocortisone 2.5 % cream APPLY TO AFFECTED AREA TWICE A DAY AS NEEDED FOR RASH 09/03/19   Bacigalupo, Dionne Bucy, MD  lisinopril (ZESTRIL) 10 MG tablet TAKE 1 TABLET BY MOUTH EVERY DAY 10/24/19   [name], MD  meclizine (ANTIVERT) 25 MG tablet Take 25 mg by mouth 2 (two) times daily as needed. 05/03/19   [provider]  meloxicam (MOBIC) 15 MG tablet Take 1 tablet (15 mg total) by mouth daily as needed for pain. 06/10/19   Enzo Bi, MD  metoprolol succinate (TOPROL-XL) 50 MG  24 hr tablet TAKE 1 TABLET (50 MG TOTAL) BY MOUTH DAILY. TAKE WITH OR IMMEDIATELY FOLLOWING A MEAL. 07/22/19   Bacigalupo, Dionne Bucy, MD  metoprolol succinate (TOPROL-XL) 50 MG 24 hr tablet Take 50 mg by mouth once daily 07/22/19   [provider]  Multiple Vitamin (MULTIVITAMIN WITH MINERALS) TABS tablet Take 1 tablet by mouth daily. 10/24/18   Vaughan Basta, MD  omeprazole (PRILOSEC) 20 MG capsule TAKE 1 CAPSULE (20 MG TOTAL) BY MOUTH DAILY AS NEEDED (HEARTBURN). 10/24/19   [name], MD  sertraline (ZOLOFT) 100 MG tablet TAKE 1 TABLET BY MOUTH EVERY DAY 10/22/19   [name], MD  triamcinolone cream (KENALOG) 0.1 % Apply to affected area of skin twice daily for itch  Avoid Face, Groin and Underarms 08/20/19   [name], MD    Family History Family History  Problem Relation Age of Onset   Lung cancer Father        smoker   Other Mother    Sudden death Son        due to "Blood clots"   Bipolar disorder Son    Kidney disease  Daughter        congenital one small kidney   Prostate cancer Neg Hx    Bladder Cancer Neg Hx     Social History Social History   Tobacco Use   Smoking status: Former Smoker    Packs/day: 1.00    Years: 27.00    Pack years: 27.00    Types: Cigarettes    Quit date: 04/18/1988    Years since quitting: 31.5   Smokeless tobacco: Former Systems developer    Types: Nurse, children's Use: Never used  Substance Use Topics   Alcohol use: Yes    Alcohol/week: 3.0 standard drinks    Types: 3 Standard drinks or equivalent per week    Comment: Occasional- Former Heavy ETOH Use   Drug use: No     Allergies   Formaldehyde and Tape   Review of Systems Review of Systems  Constitutional: Negative for chills and fever.  HENT: Positive for congestion, postnasal drip, rhinorrhea and sinus pressure. Negative for ear pain and sore throat.   Eyes: Negative for pain and visual disturbance.  Respiratory: Negative for cough and shortness of breath.   Cardiovascular: Negative for chest pain and palpitations.  Gastrointestinal: Negative for abdominal pain and vomiting.  Genitourinary: Negative for dysuria and hematuria.  Musculoskeletal: Negative for arthralgias and back pain.  Skin: Negative for color change and rash.  Neurological: Negative for seizures and syncope.  All other systems reviewed and are negative.    Physical Exam Triage Vital Signs ED Triage Vitals  Enc Vitals Group     BP 11/11/19 1504 104/70     Pulse --      Resp 11/11/19 1504 16     Temp --      Temp src --      SpO2 11/11/19 1504 95 %     Weight 11/11/19 1507 (!) 220 lb (99.8 kg)     Height --      Head Circumference --      Peak Flow --      Pain Score 11/11/19 1507 2     Pain Loc --      Pain Edu? --      Excl. in [place]? --    No data found.  Updated Vital Signs BP 104/70 (BP Location: Left Arm)  Temp 97.9 F (36.6 C)    Resp 16    Wt (!) 220 lb (99.8 kg)    SpO2 95%    BMI 33.45 kg/m   Visual  Acuity Right Eye Distance:   Left Eye Distance:   Bilateral Distance:    Right Eye Near:   Left Eye Near:    Bilateral Near:     Physical Exam Vitals and nursing note reviewed.  Constitutional:      General: He is not in acute distress.    Appearance: He is well-developed.  HENT:     Head: Normocephalic and atraumatic.     Right Ear: Tympanic membrane normal.     Left Ear: Tympanic membrane normal.     Nose: Congestion and rhinorrhea present.     Mouth/Throat:     Mouth: Mucous membranes are moist.     Pharynx: Oropharynx is clear.  Eyes:     Conjunctiva/sclera: Conjunctivae normal.  Cardiovascular:     Rate and Rhythm: Normal rate and regular rhythm.     Heart sounds: No murmur heard.   Pulmonary:     Effort: Pulmonary effort is normal. No respiratory distress.     Breath sounds: Normal breath sounds.  Abdominal:     Palpations: Abdomen is soft.     Tenderness: There is no abdominal tenderness. There is no guarding or rebound.  Musculoskeletal:     Cervical back: Neck supple.  Skin:    General: Skin is warm and dry.     Findings: No rash.  Neurological:     General: No focal deficit present.     Mental Status: He is alert and oriented to person, place, and time.     Gait: Gait normal.  Psychiatric:        Mood and Affect: Mood normal.        Behavior: Behavior normal.      UC Treatments / Results  Labs (all labs ordered are listed, but only abnormal results are displayed) Labs Reviewed - No data to display  EKG   Radiology No results found.  Procedures Procedures (including critical care time)  Medications Ordered in UC Medications - No data to display  Initial Impression / Assessment and Plan / UC Course  I have reviewed the triage vital signs and the nursing notes.  Pertinent labs & imaging results that were available during my care of the patient were reviewed by me and considered in my medical decision making (see chart for details).    Acute sinusitis.  Treating with doxycycline.  Encouraged patient to take all of the medication as prescribed.  Instructed him to follow-up with his PCP if his symptoms are not improving.  Patient agrees to plan of care.     Final Clinical Impressions(s) / UC Diagnoses   Final diagnoses:  Acute non-recurrent maxillary sinusitis     Discharge Instructions     Take the doxycycline as directed.    Follow up with your primary care provider if your symptoms are not improving.       ED Prescriptions    Medication Sig Dispense Auth. Provider   doxycycline (VIBRAMYCIN) 100 MG capsule Take 1 capsule (100 mg total) by mouth 2 (two) times daily. 20 capsule Sharion Balloon, NP     PDMP not reviewed this encounter.   Sharion Balloon, NP 11/11/19 1538

## 2019-11-11 NOTE — ED Triage Notes (Signed)
Patient at [facility] for sinus issue,cough,drainage since 3wks ago.  Some sinus headache  OTC: none  Denies: fever

## 2019-11-19 ENCOUNTER — Other Ambulatory Visit: Payer: Self-pay | Admitting: Internal Medicine

## 2019-11-27 ENCOUNTER — Telehealth: Payer: Self-pay

## 2019-11-27 NOTE — Telephone Encounter (Signed)
Copied from [place] [id number]. Topic: General - Call Back - No Documentation >> Nov 27, 2019  2:36 PM Jaynie Collins D wrote: Reason for CRM: Patient states he just missed phone call from practice, not sure what it could be about. Please call patient back.

## 2019-11-28 NOTE — Telephone Encounter (Signed)
Left detailed message for patient to call back. It looks like the automated system called him regards to his upcoming appointment for Monday 12/02/2019.

## 2019-12-02 ENCOUNTER — Other Ambulatory Visit: Payer: Self-pay

## 2019-12-02 ENCOUNTER — Ambulatory Visit (INDEPENDENT_AMBULATORY_CARE_PROVIDER_SITE_OTHER): Payer: Medicare Other | Admitting: Family Medicine

## 2019-12-02 ENCOUNTER — Encounter: Payer: Self-pay | Admitting: Family Medicine

## 2019-12-02 VITALS — BP 103/68 | HR 69 | Temp 98.4°F | Resp 16 | Wt 217.0 lb

## 2019-12-02 DIAGNOSIS — I10 Essential (primary) hypertension: Secondary | ICD-10-CM | POA: Diagnosis not present

## 2019-12-02 DIAGNOSIS — J329 Chronic sinusitis, unspecified: Secondary | ICD-10-CM

## 2019-12-02 DIAGNOSIS — R0982 Postnasal drip: Secondary | ICD-10-CM | POA: Diagnosis not present

## 2019-12-02 MED ORDER — CETIRIZINE HCL 10 MG PO TABS: 10.0000 mg | ORAL_TABLET | Freq: Every day | ORAL | 11 refills | Status: DC

## 2019-12-02 NOTE — Patient Instructions (Signed)
Postnasal Drip Postnasal drip is the feeling of mucus going down the back of your throat. Mucus is a slimy substance that moistens and cleans your nose and throat, as well as the air pockets in face bones near your forehead and cheeks (sinuses). Small amounts of mucus pass from your nose and sinuses down the back of your throat all the time. This is normal. When you produce too much mucus or the mucus gets too thick, you can feel it. Some common causes of postnasal drip include:  Having more mucus because of: ? A cold or the flu. ? Allergies. ? Cold air. ? Certain medicines.  Having more mucus that is thicker because of: ? A sinus or nasal infection. ? Dry air. ? A food allergy. Follow these instructions at home: Relieving discomfort   Gargle with a salt-water mixture 3-4 times a day or as needed. To make a salt-water mixture, completely dissolve -1 tsp of salt in 1 cup of warm water.  If the air in your home is dry, use a humidifier to add moisture to the air.  Use a saline spray or container (neti pot) to flush out the nose (nasal irrigation). These methods can help clear away mucus and keep the nasal passages moist. General instructions  Take over-the-counter and prescription medicines only as told by your health care provider.  Follow instructions from your health care provider about eating or drinking restrictions. You may need to avoid caffeine.  Avoid things that you know you are allergic to (allergens), like dust, mold, pollen, pets, or certain foods.  Drink enough fluid to keep your urine pale yellow.  Keep all follow-up visits as told by your health care provider. This is important. Contact a health care provider if:  You have a fever.  You have a sore throat.  You have difficulty swallowing.  You have headache.  You have sinus pain.  You have a cough that does not go away.  The mucus from your nose becomes thick and is green or yellow in color.  You have  cold or flu symptoms that last more than 10 days. Summary  Postnasal drip is the feeling of mucus going down the back of your throat.  If your health care provider approves, use nasal irrigation or a nasal spray 2?4 times a day.  Avoid things that you know you are allergic to (allergens), like dust, mold, pollen, pets, or certain foods. This information is not intended to replace advice given to you by your health care provider. Make sure you discuss any questions you have with your health care provider. Document Revised: 07/27/2018 Document Reviewed: 07/18/2016 Elsevier Patient Education  [employer].   Hypertension, Adult High blood pressure (hypertension) is when the force of blood pumping through the arteries is too strong. The arteries are the blood vessels that carry blood from the heart throughout the body. Hypertension forces the heart to work harder to pump blood and may cause arteries to become narrow or stiff. Untreated or uncontrolled hypertension can cause a heart attack, heart failure, a stroke, kidney disease, and other problems. A blood pressure reading consists of a higher number over a lower number. Ideally, your blood pressure should be below 120/80. The first ("top") number is called the systolic pressure. It is a measure of the pressure in your arteries as your heart beats. The second ("bottom") number is called the diastolic pressure. It is a measure of the pressure in your arteries as the heart relaxes.  What are the causes? The exact cause of this condition is not known. There are some conditions that result in or are related to high blood pressure. What increases the risk? Some risk factors for high blood pressure are under your control. The following factors may make you more likely to develop this condition:  Smoking.  Having type 2 diabetes mellitus, high cholesterol, or both.  Not getting enough exercise or physical activity.  Being overweight.  Having  too much fat, sugar, calories, or salt (sodium) in your diet.  Drinking too much alcohol. Some risk factors for high blood pressure may be difficult or impossible to change. Some of these factors include:  Having chronic kidney disease.  Having a family history of high blood pressure.  Age. Risk increases with age.  Race. You may be at higher risk if you are African American.  Gender. Men are at higher risk than women before age 37. After age 77, women are at higher risk than men.  Having obstructive sleep apnea.  Stress. What are the signs or symptoms? High blood pressure may not cause symptoms. Very high blood pressure (hypertensive crisis) may cause:  Headache.  Anxiety.  Shortness of breath.  Nosebleed.  Nausea and vomiting.  Vision changes.  Severe chest pain.  Seizures. How is this diagnosed? This condition is diagnosed by measuring your blood pressure while you are seated, with your arm resting on a flat surface, your legs uncrossed, and your feet flat on the floor. The cuff of the blood pressure monitor will be placed directly against the skin of your upper arm at the level of your heart. It should be measured at least twice using the same arm. Certain conditions can cause a difference in blood pressure between your right and left arms. Certain factors can cause blood pressure readings to be lower or higher than normal for a short period of time:  When your blood pressure is higher when you are in a health care provider's office than when you are at home, this is called [place] hypertension. Most people with this condition do not need medicines.  When your blood pressure is higher at home than when you are in a health care provider's office, this is called masked hypertension. Most people with this condition may need medicines to control blood pressure. If you have a high blood pressure reading during one visit or you have normal blood pressure with other risk  factors, you may be asked to:  Return on a different day to have your blood pressure checked again.  Monitor your blood pressure at home for 1 week or longer. If you are diagnosed with hypertension, you may have other blood or imaging tests to help your health care provider understand your overall risk for other conditions. How is this treated? This condition is treated by making healthy lifestyle changes, such as eating healthy foods, exercising more, and reducing your alcohol intake. Your health care provider may prescribe medicine if lifestyle changes are not enough to get your blood pressure under control, and if:  Your systolic blood pressure is above 130.  Your diastolic blood pressure is above 80. Your personal target blood pressure may vary depending on your medical conditions, your age, and other factors. Follow these instructions at home: Eating and drinking   Eat a diet that is high in fiber and potassium, and low in sodium, added sugar, and fat. An example eating plan is called the DASH (Dietary Approaches to Stop Hypertension) diet.  To eat this way: ? Eat plenty of fresh fruits and vegetables. Try to fill one half of your plate at each meal with fruits and vegetables. ? Eat whole grains, such as whole-wheat pasta, brown rice, or whole-grain bread. Fill about one fourth of your plate with whole grains. ? Eat or drink low-fat dairy products, such as skim milk or low-fat yogurt. ? Avoid fatty cuts of meat, processed or cured meats, and poultry with skin. Fill about one fourth of your plate with lean proteins, such as fish, chicken without skin, beans, eggs, or tofu. ? Avoid pre-made and processed foods. These tend to be higher in sodium, added sugar, and fat.  Reduce your daily sodium intake. Most people with hypertension should eat less than 1,500 mg of sodium a day.  Do not drink alcohol if: ? Your health care provider tells you not to drink. ? You are pregnant, may be  pregnant, or are planning to become pregnant.  If you drink alcohol: ? Limit how much you use to:  0-1 drink a day for women.  0-2 drinks a day for men. ? Be aware of how much alcohol is in your drink. In the [place] one drink equals one 12 oz bottle of beer (355 mL), one 5 oz glass of wine (148 mL), or one 1 oz glass of hard liquor (44 mL). Lifestyle   Work with your health care provider to maintain a healthy body weight or to lose weight. Ask what an ideal weight is for you.  Get at least 30 minutes of exercise most days of the week. Activities may include walking, swimming, or biking.  Include exercise to strengthen your muscles (resistance exercise), such as Pilates or lifting weights, as part of your weekly exercise routine. Try to do these types of exercises for 30 minutes at least 3 days a week.  Do not use any products that contain nicotine or tobacco, such as cigarettes, e-cigarettes, and chewing tobacco. If you need help quitting, ask your health care provider.  Monitor your blood pressure at home as told by your health care provider.  Keep all follow-up visits as told by your health care provider. This is important. Medicines  Take over-the-counter and prescription medicines only as told by your health care provider. Follow directions carefully. Blood pressure medicines must be taken as prescribed.  Do not skip doses of blood pressure medicine. Doing this puts you at risk for problems and can make the medicine less effective.  Ask your health care provider about side effects or reactions to medicines that you should watch for. Contact a health care provider if you:  Think you are having a reaction to a medicine you are taking.  Have headaches that keep coming back (recurring).  Feel dizzy.  Have swelling in your ankles.  Have trouble with your vision. Get help right away if you:  Develop a severe headache or confusion.  Have unusual weakness or numbness.  Feel  faint.  Have severe pain in your chest or abdomen.  Vomit repeatedly.  Have trouble breathing. Summary  Hypertension is when the force of blood pumping through your arteries is too strong. If this condition is not controlled, it may put you at risk for serious complications.  Your personal target blood pressure may vary depending on your medical conditions, your age, and other factors. For most people, a normal blood pressure is less than 120/80.  Hypertension is treated with lifestyle changes, medicines, or a combination of both. Lifestyle  changes include losing weight, eating a healthy, low-sodium diet, exercising more, and limiting alcohol. This information is not intended to replace advice given to you by your health care provider. Make sure you discuss any questions you have with your health care provider. Document Revised: 12/13/2017 Document Reviewed: 12/13/2017 Elsevier Patient Education  2020 [employer].

## 2019-12-02 NOTE — Assessment & Plan Note (Signed)
Well controlled Continue current medications Recheck metabolic panel F/u in 6 months  

## 2019-12-02 NOTE — Progress Notes (Signed)
Established patient visit   Patient: Cameron Foster.   DOB: 01-Aug-1940   79 y.o. Male  MRN: [id number] Visit Date: 12/02/2019  Today's healthcare provider: Lavon Paganini, MD   I,Sulibeya S Dimas,acting as a scribe for Lavon Paganini, MD.,have documented all relevant documentation on the behalf of Lavon Paganini, MD,as directed by  Lavon Paganini, MD while in the presence of Lavon Paganini, MD.  Chief Complaint  Patient presents with  . Hypertension  . Sinusitis   Subjective    HPI  Hypertension, follow-up  BP Readings from Last 3 Encounters:  12/02/19 103/68  11/11/19 104/70  10/18/19 119/76   Wt Readings from Last 3 Encounters:  12/02/19 217 lb (98.4 kg)  11/11/19 (!) 220 lb (99.8 kg)  08/14/19 223 lb (101.2 kg)     He was last seen for hypertension 5 months ago.  BP at that visit was 106/71. Management since that visit includes no changes.  He reports excellent compliance with treatment. He is not having side effects.  He is following a Regular diet. He is not exercising. He does not smoke.  Use of agents associated with hypertension: none.   Outside blood pressures are 120/80's. Symptoms: No chest pain No chest pressure  No palpitations No syncope  No dyspnea No orthopnea  No paroxysmal nocturnal dyspnea Yes lower extremity edema   Pertinent labs: Lab Results  Component Value Date   CHOL 156 08/15/2019   HDL 46 08/15/2019   LDLCALC 85 08/15/2019   TRIG 144 08/15/2019   CHOLHDL 4.4 06/09/2019   Lab Results  Component Value Date   NA 144 08/15/2019   K 4.1 08/15/2019   CREATININE 1.27 08/15/2019   GFRNONAA 54 (L) 08/15/2019   GFRAA 62 08/15/2019   GLUCOSE 93 08/15/2019     The ASCVD Risk score (Goff DC Jr., et al., 2013) failed to calculate for the following reasons:   The patient has a prior MI or stroke diagnosis    ---------------------------------------------------------------------------------------------------   Upper respiratory symptoms He complains of cough described as nonproductive.with no fever, chills, night sweats or weight loss. Onset of symptoms was a few weeks ago and staying constant.He is drinking plenty of fluids.  Past history is significant for no history of pneumonia or bronchitis. Patient is non-smoker  ---------------------------------------------------------------------------------------------------    Patient Active Problem List   Diagnosis Date Noted  . PSVT (paroxysmal supraventricular tachycardia) ([place]) 08/01/2019  . History of lacunar cerebrovascular accident (CVA) 07/01/2019  . Cerebrovascular accident (CVA) ([place]) 06/08/2019  . Paroxysmal atrial fibrillation ([place]) 11/07/2018  . TIA (transient ischemic attack) 10/20/2018  . History of subarachnoid hemorrhage 10/16/2018  . Recurrent sinusitis 10/16/2018  . Low libido 08/30/2018  . Obesity 05/14/2018  . Hyperlipidemia LDL goal <70 05/14/2018  . Degeneration of lumbar intervertebral disc 03/22/2018  . Osteoarthritis of hip 03/22/2018  . Trochanteric bursitis of right hip 03/22/2018  . Status post partial colectomy 01/03/2017  . Depression, major, single episode, mild ([place]) 01/03/2017  . Vasovagal syncope 01/27/2016  . History of skin cancer 10/28/2015  . Osteoarthritis of right knee 10/01/2015  . Essential hypertension 08/25/2015  . Dyspnea on exertion 08/12/2015  . Erectile dysfunction following radiation therapy 08/04/2015  . Constipation 08/02/2015  . History of prostate cancer 01/20/2015  . Swelling of right lower extremity 01/20/2015   Social History   Tobacco Use  . Smoking status: Former Smoker    Packs/day: 1.00    Years: 27.00    Pack years: 27.00  Types: Cigarettes    Quit date: 04/18/1988    Years since quitting: 31.6  . Smokeless tobacco: Former Systems developer    Types: Secondary school teacher  . Vaping  Use: Never used  Substance Use Topics  . Alcohol use: Yes    Alcohol/week: 3.0 standard drinks    Types: 3 Standard drinks or equivalent per week    Comment: Occasional- Former Heavy ETOH Use  . Drug use: No   Allergies  Allergen Reactions  . Formaldehyde Rash  . Tape Rash    Pt states he is not allergic to tape.       Medications: Outpatient Medications Prior to Visit  Medication Sig  . amLODipine (NORVASC) 10 MG tablet TAKE 1 TABLET BY MOUTH EVERY DAY  . aspirin 81 MG EC tablet Take 81 mg by mouth daily.   Marland Kitchen atorvastatin (LIPITOR) 40 MG tablet Take 1 tablet (40 mg total) by mouth daily.  . Cyanocobalamin (VITAMIN B 12 PO) Take 1,000 mcg by mouth daily.  Marland Kitchen docusate sodium (STOOL SOFTENER) 100 MG capsule Take 1 capsule (100 mg total) by mouth daily.  . fluticasone (FLONASE) 50 MCG/ACT nasal spray PLACE 1 SPRAY INTO BOTH NOSTRILS DAILY AS NEEDED FOR ALLERGIES OR RHINITIS.  . furosemide (LASIX) 20 MG tablet TAKE 1 TABLET BY MOUTH EVERY DAY  . hydrocortisone 2.5 % cream APPLY TO AFFECTED AREA TWICE A DAY AS NEEDED FOR RASH  . lisinopril (ZESTRIL) 10 MG tablet TAKE 1 TABLET BY MOUTH EVERY DAY  . meclizine (ANTIVERT) 25 MG tablet Take 25 mg by mouth 2 (two) times daily as needed.  . meloxicam (MOBIC) 15 MG tablet Take 1 tablet (15 mg total) by mouth daily as needed for pain.  . metoprolol succinate (TOPROL-XL) 50 MG 24 hr tablet TAKE 1 TABLET (50 MG TOTAL) BY MOUTH DAILY. TAKE WITH OR IMMEDIATELY FOLLOWING A MEAL.  . Multiple Vitamin (MULTIVITAMIN WITH MINERALS) TABS tablet Take 1 tablet by mouth daily.  Marland Kitchen omeprazole (PRILOSEC) 20 MG capsule TAKE 1 CAPSULE (20 MG TOTAL) BY MOUTH DAILY AS NEEDED (HEARTBURN).  Marland Kitchen sertraline (ZOLOFT) 100 MG tablet TAKE 1 TABLET BY MOUTH EVERY DAY  . triamcinolone cream (KENALOG) 0.1 % Apply to affected area of skin twice daily for itch  Avoid Face, Groin and Underarms  . [DISCONTINUED] amLODipine (NORVASC) 10 MG tablet Take by mouth.  . [DISCONTINUED]  atorvastatin (LIPITOR) 40 MG tablet Take by mouth.  . [DISCONTINUED] doxycycline (VIBRAMYCIN) 100 MG capsule Take 1 capsule (100 mg total) by mouth 2 (two) times daily. (Patient not taking: Reported on 12/02/2019)  . [DISCONTINUED] metoprolol succinate (TOPROL-XL) 50 MG 24 hr tablet Take 50 mg by mouth once daily   No facility-administered medications prior to visit.    Review of Systems  Constitutional: Negative for chills and fatigue.  HENT: Positive for congestion, postnasal drip and sinus pain. Negative for ear pain.   Respiratory: Positive for cough. Negative for shortness of breath and wheezing.   Cardiovascular: Negative for chest pain and palpitations.  Musculoskeletal: Positive for neck pain.    {Heme  Chem  Endocrine  Serology  Results Review (optional):[id number]::" "}  Objective    BP 103/68 (BP Location: Left Arm, Patient Position: Sitting, Cuff Size: Normal)   Pulse 69   Temp 98.4 F (36.9 C) (Oral)   Resp 16   Wt 217 lb (98.4 kg)   BMI 32.99 kg/m  BP Readings from Last 3 Encounters:  12/02/19 103/68  11/11/19 104/70  10/18/19 119/76  Wt Readings from Last 3 Encounters:  12/02/19 217 lb (98.4 kg)  11/11/19 (!) 220 lb (99.8 kg)  08/14/19 223 lb (101.2 kg)      Physical Exam Constitutional:      Appearance: Normal appearance. He is obese.  HENT:     Right Ear: Tympanic membrane normal.     Left Ear: Tympanic membrane normal.     Mouth/Throat:     Mouth: Mucous membranes are dry.     Pharynx: No posterior oropharyngeal erythema.  Eyes:     General: No scleral icterus. Cardiovascular:     Rate and Rhythm: Normal rate and regular rhythm.     Pulses: Normal pulses.     Heart sounds: Normal heart sounds.  Pulmonary:     Effort: Pulmonary effort is normal. No respiratory distress.     Breath sounds: Normal breath sounds. No wheezing, rhonchi or rales.  Skin:    General: Skin is warm and dry.  Neurological:     Mental Status: He is alert.     No  results found for any visits on 12/02/19.  Assessment & Plan     Problem List Items Addressed This Visit      Cardiovascular and Mediastinum   Essential hypertension - Primary    Well controlled Continue current medications Recheck metabolic panel F/u in 6 months         Respiratory   Recurrent sinusitis    Recurrent sinus congestion - no signs of bacterial sinusitis at this time Given the chronicity, suspect allergic rhinitis with postnasal drip Continue taking Flonase daily and add antihistamine       Relevant Medications   cetirizine (ZYRTEC) 10 MG tablet    Other Visit Diagnoses    Post-nasal drip       Relevant Medications   cetirizine (ZYRTEC) 10 MG tablet       Return in about 5 months (around 05/15/2020) for CPE.      I, Lavon Paganini, MD, have reviewed all documentation for this visit. The documentation on 12/03/19 for the exam, diagnosis, procedures, and orders are all accurate and complete.   Ashiyah Pavlak, Dionne Bucy, MD, MPH [facility]

## 2019-12-02 NOTE — Assessment & Plan Note (Addendum)
Recurrent sinus congestion - no signs of bacterial sinusitis at this time Given the chronicity, suspect allergic rhinitis with postnasal drip Continue taking Flonase daily and add antihistamine

## 2019-12-17 ENCOUNTER — Encounter: Payer: Self-pay | Admitting: Dermatology

## 2019-12-17 ENCOUNTER — Ambulatory Visit (INDEPENDENT_AMBULATORY_CARE_PROVIDER_SITE_OTHER): Payer: Medicare Other | Admitting: Dermatology

## 2019-12-17 ENCOUNTER — Other Ambulatory Visit: Payer: Self-pay | Admitting: Dermatology

## 2019-12-17 ENCOUNTER — Other Ambulatory Visit: Payer: Self-pay

## 2019-12-17 DIAGNOSIS — L570 Actinic keratosis: Secondary | ICD-10-CM | POA: Diagnosis not present

## 2019-12-17 DIAGNOSIS — C44321 Squamous cell carcinoma of skin of nose: Secondary | ICD-10-CM | POA: Diagnosis not present

## 2019-12-17 DIAGNOSIS — L820 Inflamed seborrheic keratosis: Secondary | ICD-10-CM

## 2019-12-17 DIAGNOSIS — L578 Other skin changes due to chronic exposure to nonionizing radiation: Secondary | ICD-10-CM

## 2019-12-17 DIAGNOSIS — D485 Neoplasm of uncertain behavior of skin: Secondary | ICD-10-CM

## 2019-12-17 DIAGNOSIS — Z85828 Personal history of other malignant neoplasm of skin: Secondary | ICD-10-CM | POA: Diagnosis not present

## 2019-12-17 DIAGNOSIS — C44319 Basal cell carcinoma of skin of other parts of face: Secondary | ICD-10-CM | POA: Diagnosis not present

## 2019-12-17 DIAGNOSIS — I639 Cerebral infarction, unspecified: Secondary | ICD-10-CM | POA: Diagnosis not present

## 2019-12-17 NOTE — Patient Instructions (Addendum)
Wound Care Instructions  1. Cleanse wound gently with soap and water once a day then pat dry with clean gauze. Apply a thing coat of Petrolatum (petroleum jelly, "Vaseline") over the wound (unless you have an allergy to this). We recommend that you use a new, sterile tube of Vaseline. Do not pick or remove scabs. Do not remove the yellow or Schlafer "healing tissue" from the base of the wound.  2. Cover the wound with fresh, clean, nonstick gauze and secure with paper tape. You may use Band-Aids in place of gauze and tape if the would is small enough, but would recommend trimming much of the tape off as there is often too much. Sometimes Band-Aids can irritate the skin.  3. You should call the office for your biopsy report after 1 week if you have not already been contacted.  4. If you experience any problems, such as abnormal amounts of bleeding, swelling, significant bruising, significant pain, or evidence of infection, please call the office immediately.   Cryotherapy Aftercare   Wash gently with soap and water everyday.    Apply Vaseline and Band-Aid daily until healed.

## 2019-12-17 NOTE — Progress Notes (Addendum)
Follow-Up Visit   Subjective  Cameron Foster. is a 79 y.o. male who presents for the following: Follow-up (3 month). He has new spot on his nose that came up a couple of months ago. He has a h/o of Mohs surgery on his nose in [place]. He had a biopsy 3 months ago that was [facility] on the right nasal ala and was treated with EDC at that time.  It has healed well.  He also has more itchy spots on trunk.  ISKs treated last time are improved.  Spot on L ear frozen last visit didn't clear up.  The following portions of the chart were reviewed this encounter and updated as appropriate:      Review of Systems:  No other skin or systemic complaints except as noted in HPI or Assessment and Plan.  Objective  Well appearing patient in no apparent distress; mood and affect are within normal limits.  A focused examination was performed including face, trunk. Relevant physical exam findings are noted in the Assessment and Plan.  Objective  Right Nasal Ala: Well healed scar with no evidence of recurrence.   Objective  Mid Nasal Dorsum: 10.0 x 6.8m pink crusted papule     Objective  Left Upper Eyebrow: 6.054mpink pearly papule     Objective  Left Mid Ear Helix x 1: Keratotic papule.  Objective  Bilateral Flank: Erythematous keratotic or waxy stuck-on papules or plaques.    Assessment & Plan   Actinic Damage - diffuse scaly erythematous macules with underlying dyspigmentation - Recommend daily broad spectrum sunscreen SPF 30+ to sun-exposed areas, reapply every 2 hours as needed.  - Call for new or changing lesions.  History of Basal Cell Carcinoma of the Skin - No evidence of recurrence today - Recommend regular full body skin exams - Recommend daily broad spectrum sunscreen SPF 30+ to sun-exposed areas, reapply every 2 hours as needed.  - Call if any new or changing lesions are noted between office visits  History of Squamous Cell Carcinoma of the Skin - No evidence of  recurrence today - Recommend regular full body skin exams - Recommend daily broad spectrum sunscreen SPF 30+ to sun-exposed areas, reapply every 2 hours as needed.  - Call if any new or changing lesions are noted between office visits  History of basal cell carcinoma (BCC) Right Nasal Ala  Clear. Observe for recurrence. Call clinic for new or changing lesions.  Recommend regular skin exams, daily broad-spectrum spf 30+ sunscreen use, and photoprotection.     Neoplasm of uncertain behavior of skin (2) Mid Nasal Dorsum  Skin / nail biopsy Type of biopsy: tangential   Informed consent: discussed and consent obtained   Patient was prepped and draped in usual sterile fashion: Area prepped with alcohol. Anesthesia: the lesion was anesthetized in a standard fashion   Anesthetic:  1% lidocaine w/ epinephrine 1-100,000 buffered w/ 8.4% NaHCO3 Instrument used: flexible razor blade   Hemostasis achieved with: pressure, aluminum chloride and electrodesiccation   Outcome: patient tolerated procedure well   Post-procedure details: wound care instructions given   Post-procedure details comment:  Ointment and small bandage applied  Specimen 1 - Surgical pathology Differential Diagnosis: Inflamed SK r/o BCC Check Margins: No 10.0 x 6.36m55mink crusted papule  Left Upper Eyebrow  Skin / nail biopsy Type of biopsy: tangential   Informed consent: discussed and consent obtained   Patient was prepped and draped in usual sterile fashion: Area prepped with alcohol.  Anesthesia: the lesion was anesthetized in a standard fashion   Anesthetic:  1% lidocaine w/ epinephrine 1-100,000 buffered w/ 8.4% NaHCO3 Instrument used: flexible razor blade   Hemostasis achieved with: pressure, aluminum chloride and electrodesiccation   Outcome: patient tolerated procedure well   Post-procedure details: wound care instructions given   Post-procedure details comment:  Ointment and small bandage applied  Specimen 2 -  Surgical pathology Differential Diagnosis: Inflamed SK r/o BCC Check Margins: No 6.43m pink pearly papule  If mid nasal dorsum positive, [place] If left upper eyebrow positive, will schedule EDC.  AK (actinic keratosis) Left Mid Ear Helix x 1  Cryotherapy x 1, 2nd treatment, recheck on f/up  Destruction of lesion - Left Mid Ear Helix x 1  Destruction method: cryotherapy   Destruction method comment:  Electrodessication Informed consent: discussed and consent obtained   Lesion destroyed using liquid nitrogen: Yes   Region frozen until ice ball extended beyond lesion: Yes   Outcome: patient tolerated procedure well with no complications   Post-procedure details: wound care instructions given   Additional details:  Prior to procedure, discussed risks of blister formation, small wound, skin dyspigmentation, or rare scar following cryotherapy.  Inflamed seborrheic keratosis Bilateral Flank  Symptomatic Will treat with LN2 on f/u.  Return in about 3 months (around 03/17/2020) for f/u bx, AK, treat ISKs.   I,Jamesetta Orleans CMA, am acting as scribe for TBrendolyn Patty MD .  Documentation: I have reviewed the above documentation for accuracy and completeness, and I agree with the above.  TBrendolyn PattyMD

## 2019-12-24 ENCOUNTER — Telehealth: Payer: Self-pay

## 2019-12-24 DIAGNOSIS — C44320 Squamous cell carcinoma of skin of unspecified parts of face: Secondary | ICD-10-CM

## 2019-12-24 NOTE — Telephone Encounter (Signed)
Advised pt of bx results.  Advised pt we will schedule him for Mohs at [place] for the [facility] [place] of the mid nasal dorsum and scheduled pt for [facility] for the [facility] of the L upper eyebrow.

## 2019-12-24 NOTE — Telephone Encounter (Signed)
-----   Message from [name], MD sent at 12/24/2019  8:54 AM EDT ----- 1. Skin , (A) mid nasal dorsum POORLY DIFFERENTIATED SQUAMOUS CELL CARCINOMA, BASE INVOLVED 2. Skin , (B) left upper eyebrow BASAL CELL CARCINOMA, SUPERFICIAL AND NODULAR PATTERNS  1. SCC- Needs Mohs [place], sooner appointment preferred due to it being poorly differentiated. 2. BCC- needs EDC here in office

## 2019-12-25 NOTE — Addendum Note (Signed)
Addended by: Larence Penning on: 12/25/2019 07:48 AM   Modules accepted: Orders

## 2020-01-01 ENCOUNTER — Encounter: Payer: Self-pay | Admitting: Family Medicine

## 2020-01-03 DIAGNOSIS — R600 Localized edema: Secondary | ICD-10-CM | POA: Diagnosis not present

## 2020-01-09 DIAGNOSIS — C44321 Squamous cell carcinoma of skin of nose: Secondary | ICD-10-CM | POA: Diagnosis not present

## 2020-01-14 ENCOUNTER — Encounter: Payer: Self-pay | Admitting: Family Medicine

## 2020-01-14 ENCOUNTER — Ambulatory Visit (INDEPENDENT_AMBULATORY_CARE_PROVIDER_SITE_OTHER): Payer: Medicare Other | Admitting: Family Medicine

## 2020-01-14 ENCOUNTER — Other Ambulatory Visit: Payer: Self-pay

## 2020-01-14 VITALS — BP 127/82 | HR 75 | Temp 98.1°F | Wt 217.0 lb

## 2020-01-14 DIAGNOSIS — M7989 Other specified soft tissue disorders: Secondary | ICD-10-CM

## 2020-01-14 DIAGNOSIS — I10 Essential (primary) hypertension: Secondary | ICD-10-CM | POA: Diagnosis not present

## 2020-01-14 DIAGNOSIS — Z23 Encounter for immunization: Secondary | ICD-10-CM

## 2020-01-14 DIAGNOSIS — M542 Cervicalgia: Secondary | ICD-10-CM | POA: Diagnosis not present

## 2020-01-14 DIAGNOSIS — S41112A Laceration without foreign body of left upper arm, initial encounter: Secondary | ICD-10-CM

## 2020-01-14 MED ORDER — LISINOPRIL 40 MG PO TABS: 40.0000 mg | ORAL_TABLET | Freq: Every day | ORAL | 1 refills | Status: DC

## 2020-01-14 NOTE — Patient Instructions (Addendum)
[facility] [place], [place] [address]  [phone]   Stop amlodipine Increase lisinopril to 59m daily

## 2020-01-14 NOTE — Progress Notes (Signed)
Established patient visit   Patient: Cameron Foster.   DOB: October 01, 1940   79 y.o. Male  MRN: [id number] Visit Date: 01/14/2020  Today's healthcare provider: Lavon Paganini, MD   Chief Complaint  Patient presents with   Edema   Neck Pain   Subjective    HPI   1. Leg swelling Gershon Mussel has had swelling of his right leg for years, which has worsened since his knee replacement approximately 2 years ago. He has a history of right calf muscular injury in the early 1980s. He has been seen by vascular surgery (04/05/2018) and podiatry (01/03/2020) for this. Vascular surgery believed this was related to calf muscle injury and instructed him to wear compression stockings. They recommended bilateral lower extremity venous duplex to rule out reflux, which was not done at that time.  Tom reports that the compression hose work, but are extremely difficult to put on with his hip arthritis. He would like to do something for this so he can be more active.  He has been on amlodipine for years.  2. Neck pain Tom reports that his "whole spine is messed up" due to many injuries and arthritis. He has had CT/MRIs and is followed by orthopedics, but reports that orthopedics will not do anything. He is having a lot of pain in his left shoulder. This pain is worse with activity and restricts his activity. He would like referral to PT. Lives 3-4 minutes from [facility].  Patient Active Problem List   Diagnosis Date Noted   PSVT (paroxysmal supraventricular tachycardia) ([place]) 08/01/2019   History of lacunar cerebrovascular accident (CVA) 07/01/2019   Cerebrovascular accident (CVA) ([place]) 06/08/2019   Paroxysmal atrial fibrillation ([place]) 11/07/2018   TIA (transient ischemic attack) 10/20/2018   History of subarachnoid hemorrhage 10/16/2018   Recurrent sinusitis 10/16/2018   Low libido 08/30/2018   Obesity 05/14/2018   Hyperlipidemia LDL goal <70 05/14/2018   Degeneration of lumbar  intervertebral disc 03/22/2018   Osteoarthritis of hip 03/22/2018   Trochanteric bursitis of right hip 03/22/2018   Status post partial colectomy 01/03/2017   Depression, major, single episode, mild ([place]) 01/03/2017   Vasovagal syncope 01/27/2016   History of skin cancer 10/28/2015   Osteoarthritis of right knee 10/01/2015   Essential hypertension 08/25/2015   Dyspnea on exertion 08/12/2015   Erectile dysfunction following radiation therapy 08/04/2015   Constipation 08/02/2015   History of prostate cancer 01/20/2015   Swelling of right lower extremity 01/20/2015       Medications: Outpatient Medications Prior to Visit  Medication Sig   aspirin 81 MG EC tablet Take 81 mg by mouth daily.    cetirizine (ZYRTEC) 10 MG tablet Take 1 tablet (10 mg total) by mouth daily.   Cyanocobalamin (VITAMIN B 12 PO) Take 1,000 mcg by mouth daily.   docusate sodium (STOOL SOFTENER) 100 MG capsule Take 1 capsule (100 mg total) by mouth daily.   fluticasone (FLONASE) 50 MCG/ACT nasal spray PLACE 1 SPRAY INTO BOTH NOSTRILS DAILY AS NEEDED FOR ALLERGIES OR RHINITIS.   furosemide (LASIX) 20 MG tablet TAKE 1 TABLET BY MOUTH EVERY DAY   hydrocortisone 2.5 % cream APPLY TO AFFECTED AREA TWICE A DAY AS NEEDED FOR RASH   meclizine (ANTIVERT) 25 MG tablet Take 25 mg by mouth 2 (two) times daily as needed.   meloxicam (MOBIC) 15 MG tablet Take 1 tablet (15 mg total) by mouth daily as needed for pain.   metoprolol succinate (TOPROL-XL) 50 MG 24  hr tablet TAKE 1 TABLET (50 MG TOTAL) BY MOUTH DAILY. TAKE WITH OR IMMEDIATELY FOLLOWING A MEAL.   Multiple Vitamin (MULTIVITAMIN WITH MINERALS) TABS tablet Take 1 tablet by mouth daily.   omeprazole (PRILOSEC) 20 MG capsule TAKE 1 CAPSULE (20 MG TOTAL) BY MOUTH DAILY AS NEEDED (HEARTBURN).   sertraline (ZOLOFT) 100 MG tablet TAKE 1 TABLET BY MOUTH EVERY DAY   [DISCONTINUED] amLODipine (NORVASC) 10 MG tablet TAKE 1 TABLET BY MOUTH EVERY DAY    [DISCONTINUED] lisinopril (ZESTRIL) 10 MG tablet TAKE 1 TABLET BY MOUTH EVERY DAY   atorvastatin (LIPITOR) 40 MG tablet Take 1 tablet (40 mg total) by mouth daily.   [DISCONTINUED] triamcinolone cream (KENALOG) 0.1 % Apply to affected area of skin twice daily for itch  Avoid Face, Groin and Underarms   No facility-administered medications prior to visit.     Review of Systems  Respiratory: Negative for chest tightness and shortness of breath.   Cardiovascular: Negative for chest pain and leg swelling.  Musculoskeletal: Positive for arthralgias, back pain, gait problem, myalgias, neck pain and neck stiffness.  Skin: Positive for wound.  Neurological: Positive for dizziness. Negative for syncope, light-headedness and headaches.       Objective    BP 127/82 (BP Location: Left Arm, Patient Position: Sitting, Cuff Size: Large)    Pulse 75    Temp 98.1 F (36.7 C) (Oral)    Wt 217 lb (98.4 kg)    BMI 32.99 kg/m  Wt Readings from Last 3 Encounters:  01/14/20 217 lb (98.4 kg)  12/02/19 217 lb (98.4 kg)  11/11/19 (!) 220 lb (99.8 kg)    Physical Exam Constitutional:      General: He is not in acute distress.    Appearance: He is obese. He is not ill-appearing, toxic-appearing or diaphoretic.  HENT:     Head: Normocephalic and atraumatic.     Right Ear: External ear normal.     Left Ear: External ear normal.  Cardiovascular:     Rate and Rhythm: Normal rate and regular rhythm.     Heart sounds: Normal heart sounds. No murmur heard.  No friction rub. No gallop.   Pulmonary:     Effort: Pulmonary effort is normal. No respiratory distress.     Breath sounds: Normal breath sounds. No stridor. No wheezing.  Musculoskeletal:     Right lower leg: Edema present.     Left lower leg: No edema.  Skin:    General: Skin is warm and dry.     Findings: Lesion present.     Comments: Scar on right index finger; scab on left arm  Neurological:     Mental Status: He is alert. Mental status is  at baseline.     Gait: Gait abnormal.  Psychiatric:        Mood and Affect: Mood normal.        Behavior: Behavior normal.        Thought Content: Thought content normal.        Judgment: Judgment normal.     No results found for any visits on 01/14/20.  Assessment & Plan     Problem List Items Addressed This Visit      Cardiovascular and Mediastinum   Essential hypertension    Well-controlled today As we are stopping amlodipine as it may be contributing to her edema, we will increase lisinopril to 40 mg daily to compensate Advised to watch home blood pressure readings Reviewed last metabolic panel  Relevant Medications   lisinopril (ZESTRIL) 40 MG tablet     Other   Swelling of right lower extremity - Primary    Chronic right lower extremity edema that is unilateral, worsening History of right calf muscle injury and right knee replacement Previously evaluated by vascular surgery and podiatry Discussed compression stockings Continue Lasix Stop amlodipine Referred to vascular surgery for venous assessment      Relevant Orders   Ambulatory referral to Vascular Surgery   RESOLVED: Cervicalgia    Long standing Continue to work with PM&R Consider further ESI Referral to PT placed today      Relevant Orders   Ambulatory referral to Physical Therapy    Other Visit Diagnoses    Laceration of left upper extremity, initial encounter       Relevant Orders   Tdap vaccine greater than or equal to 7yo IM (Completed)   Need for influenza vaccination       Relevant Orders   Flu Vaccine QUAD High Dose(Fluad) (Completed)       Return in about 4 months (around 05/15/2020) for as scheduled.     Rodrigo Ran, MS3   Patient seen along with MS3 student Rodrigo Ran. I personally evaluated this patient along with the student, and verified all aspects of the history, physical exam, and medical decision making as documented by the student. I agree with the student's  documentation and have made all necessary edits.  Velma Hanna, Dionne Bucy, MD, MPH [facility]

## 2020-01-14 NOTE — Assessment & Plan Note (Addendum)
Chronic right lower extremity edema that is unilateral, worsening History of right calf muscle injury and right knee replacement Previously evaluated by vascular surgery and podiatry Discussed compression stockings Continue Lasix Stop amlodipine Referred to vascular surgery for venous assessment

## 2020-01-15 ENCOUNTER — Encounter: Payer: Self-pay | Admitting: Family Medicine

## 2020-01-15 NOTE — Assessment & Plan Note (Signed)
Long standing Continue to work with PM&R Consider further ESI Referral to PT placed today

## 2020-01-15 NOTE — Assessment & Plan Note (Signed)
Well-controlled today As we are stopping amlodipine as it may be contributing to her edema, we will increase lisinopril to 40 mg daily to compensate Advised to watch home blood pressure readings Reviewed last metabolic panel

## 2020-01-19 ENCOUNTER — Encounter: Payer: Self-pay | Admitting: Family Medicine

## 2020-01-21 ENCOUNTER — Other Ambulatory Visit: Payer: Self-pay | Admitting: Family Medicine

## 2020-01-21 NOTE — Telephone Encounter (Signed)
Pt calling regarding his PT and is requesting to have a call back. He states that he is in a lot of pain. Please advise.

## 2020-01-21 NOTE — Telephone Encounter (Signed)
Requested Prescriptions  Pending Prescriptions Disp Refills  . sertraline (ZOLOFT) 100 MG tablet [Pharmacy Med Name: SERTRALINE HCL 100 MG TABLET] 90 tablet 1    Sig: TAKE 1 TABLET BY MOUTH EVERY DAY     Psychiatry:  Antidepressants - SSRI Passed - 01/21/2020  1:25 AM      Passed - Completed PHQ-2 or PHQ-9 in the last 360 days.      Passed - Valid encounter within last 6 months    Recent Outpatient Visits          1 week ago Swelling of right lower extremity   [facility], Dionne Bucy, MD   1 month ago Essential hypertension   [place], Dionne Bucy, MD   5 months ago Mixed hyperlipidemia   [facility], Dionne Bucy, MD   6 months ago History of lacunar cerebrovascular accident (CVA)   [place], Dionne Bucy, MD   8 months ago Encounter for annual wellness visit (AWV) in Medicare patient   [facility], Dionne Bucy, MD      Future Appointments            In 1 week End, Harrell Gave, MD [facility], [place]   In 2 weeks [name], MD [place]   In 1 month [name], MD [place]           . lisinopril (ZESTRIL) 10 MG tablet [Pharmacy Med Name: LISINOPRIL 10 MG TABLET] 90 tablet 0    Sig: TAKE 1 TABLET BY MOUTH EVERY DAY     Cardiovascular:  ACE Inhibitors Passed - 01/21/2020  1:25 AM      Passed - Cr in normal range and within 180 days    Creat  Date Value Ref Range Status  08/12/2015 1.40 (H) 0.70 - 1.18 mg/dL Final   Creatinine, Ser  Date Value Ref Range Status  08/15/2019 1.27 0.76 - 1.27 mg/dL Final         Passed - K in normal range and within 180 days    Potassium  Date Value Ref Range Status  08/15/2019 4.1 3.5 - 5.2 mmol/L Final         Passed - Patient is not pregnant      Passed - Last BP in normal range    BP Readings from Last 1 Encounters:  01/14/20 127/82         Passed - Valid  encounter within last 6 months    Recent Outpatient Visits          1 week ago Swelling of right lower extremity   [facility], Dionne Bucy, MD   1 month ago Essential hypertension   [place], Dionne Bucy, MD   5 months ago Mixed hyperlipidemia   [facility], Dionne Bucy, MD   6 months ago History of lacunar cerebrovascular accident (CVA)   [place], Dionne Bucy, MD   8 months ago Encounter for annual wellness visit (AWV) in Medicare patient   [place], Dionne Bucy, MD      Future Appointments            In 1 week End, Harrell Gave, MD [facility] [place]   In 2 weeks [name], MD [place]   In 1 month [name], MD [place]

## 2020-01-23 DIAGNOSIS — L570 Actinic keratosis: Secondary | ICD-10-CM | POA: Diagnosis not present

## 2020-01-23 DIAGNOSIS — C44311 Basal cell carcinoma of skin of nose: Secondary | ICD-10-CM | POA: Diagnosis not present

## 2020-01-24 ENCOUNTER — Other Ambulatory Visit: Payer: Self-pay | Admitting: Family Medicine

## 2020-01-29 ENCOUNTER — Ambulatory Visit (INDEPENDENT_AMBULATORY_CARE_PROVIDER_SITE_OTHER): Payer: Medicare Other | Admitting: Internal Medicine

## 2020-01-29 ENCOUNTER — Other Ambulatory Visit: Payer: Self-pay

## 2020-01-29 ENCOUNTER — Encounter: Payer: Self-pay | Admitting: Internal Medicine

## 2020-01-29 VITALS — BP 134/84 | HR 76 | Ht 68.0 in | Wt 217.0 lb

## 2020-01-29 DIAGNOSIS — I452 Bifascicular block: Secondary | ICD-10-CM | POA: Diagnosis not present

## 2020-01-29 DIAGNOSIS — R0600 Dyspnea, unspecified: Secondary | ICD-10-CM | POA: Diagnosis not present

## 2020-01-29 DIAGNOSIS — R600 Localized edema: Secondary | ICD-10-CM | POA: Diagnosis not present

## 2020-01-29 DIAGNOSIS — I480 Paroxysmal atrial fibrillation: Secondary | ICD-10-CM | POA: Diagnosis not present

## 2020-01-29 DIAGNOSIS — R0609 Other forms of dyspnea: Secondary | ICD-10-CM

## 2020-01-29 DIAGNOSIS — I10 Essential (primary) hypertension: Secondary | ICD-10-CM

## 2020-01-29 MED ORDER — BISOPROLOL FUMARATE 5 MG PO TABS: 5.0000 mg | ORAL_TABLET | Freq: Every day | ORAL | 1 refills | Status: DC

## 2020-01-29 NOTE — Patient Instructions (Signed)
Medication Instructions:  Your physician has recommended you make the following change in your medication:  STOP Metoprolol. START Bisoprolol 5 mg by mouth once a day.   *If you need a refill on your cardiac medications before your next appointment, please call your pharmacy*  Follow-Up: At [facility], you and your health needs are our priority.  As part of our continuing mission to provide you with exceptional heart care, we have created designated Provider Care Teams.  These Care Teams include your primary Cardiologist (physician) and Advanced Practice Providers (APPs -  Physician Assistants and Nurse Practitioners) who all work together to provide you with the care you need, when you need it.  We recommend signing up for the patient portal called "MyChart".  Sign up information is provided on this After Visit Summary.  MyChart is used to connect with patients for Virtual Visits (Telemedicine).  Patients are able to view lab/test results, encounter notes, upcoming appointments, etc.  Non-urgent messages can be sent to your provider as well.   To learn more about what you can do with MyChart, go to [email or web address].    Your next appointment:   3 month(s)  The format for your next appointment:   In Person  Provider:   You may see DR Harrell Gave END or one of the following Advanced Practice Providers on your designated Care Team:    Murray Hodgkins, NP  Christell Faith, PA-C  Marrianne Mood, PA-C  [place], [place]

## 2020-01-29 NOTE — Progress Notes (Signed)
Follow-up Outpatient Visit Date: 01/29/2020  Primary Care Provider: [name], MD [address] [place] [address]  Chief Complaint: Fatigue  HPI:  Cameron Foster is a 79 y.o. male with history of hypertension, hyperlipidemia, single episode of atrial fibrillation in 1995,subarachnoid hemorrhage of uncertain etiology and more recent stroke,prostate cancer, and alcohol abuse, who presents for follow-up of stroke.  I last saw Cameron Foster in April, at which time he was doing relatively well.  Preceding 14-day event monitor showed multiple episodes of brief PSVT but no atrial fibrillation.  He had just started wearing CPAP when I last saw him but was having a difficult time adjusting to the mask.  He had not developed any new focal neurologic deficits.  Chronic lower extremity edema was stable.  We discussed referral to EP for consideration of implantable loop recorder placement, but Cameron Foster declined.  Today, Cameron Foster reports that he continues to be bothered by fatigue manifested as exertional dyspnea with mild activity.  This is about the same as at our last visit.  His mobility is also quite limited by chronic back pain.  He still notes significant swelling of the left calf that he attributes to a remote muscle injury.  Due to swelling, amlodipine was discontinued by his PCP.  Her weight has not noticed any improvement since coming off this medication.  Lisinopril was also increased to improve blood pressure control.  Cameron Foster denies chest pain, palpitations, and lightheadedness.  He has stopped using CPAP, as he did not tolerate the mask well and did not experience any improvement in his symptoms.  He is scheduled for vascular evaluation next week regarding his right calf swelling.  --------------------------------------------------------------------------------------------------  Past Medical History:  Diagnosis Date   Alcoholism ([place])    Arthritis    Atrial  fibrillation ([place])    one episode   Basal cell carcinoma 04/26/2017   Right medial cheek. Superficial and nodular   Basal cell carcinoma 06/11/2019   Left anterior shoulder. Nodular pattern   Basal cell carcinoma 09/09/2019   Right nasal ala, EDC   Colon polyp    Depression    Son died 07/11/2014   Diverticulitis    Diverticulosis 30 years   Dysrhythmia    At Fib 1993/07/11   GERD (gastroesophageal reflux disease)    Hyperlipidemia    Hypertension    Irritable bowel syndrome    Prostate cancer ([place]) 11-Jul-2010   treated with radiation therapy. Prostate   Squamous cell carcinoma of skin 07/18/2017   Left medial calf. KA type   Squamous cell carcinoma of skin 04/24/2018   Right above med. brow   Squamous cell carcinoma of skin 06/11/2019   Right posterior shoulder. SCCis, hypertrophic   Vasovagal syncope    Past Surgical History:  Procedure Laterality Date   CARDIAC CATHETERIZATION  July 11, 1996   [place] no stents   CATARACT EXTRACTION, BILATERAL     COLON RESECTION SIGMOID N/A 12/07/2016   Procedure: COLON RESECTION SIGMOID;  Surgeon: Clayburn Pert, MD;  Location: [facility] ORS;  Service: General;  Laterality: N/A;   COLON SURGERY  11/2016   Colostomy   COLONOSCOPY  2014-07-11   COLONOSCOPY WITH PROPOFOL N/A 05/30/2016   Procedure: COLONOSCOPY WITH PROPOFOL;  Surgeon: Lollie Sails, MD;  Location[facility] ENDOSCOPY;  Service: Endoscopy;  Laterality: N/A;   COLOSTOMY Left 12/07/2016   Procedure: COLOSTOMY;  Surgeon: Clayburn Pert, MD;  Location: [facility] ORS;  Service: General;  Laterality: Left;   COLOSTOMY  REVERSAL N/A 03/21/2017   Procedure: COLOSTOMY REVERSAL;  Surgeon: Clayburn Pert, MD;  Location: [facility] ORS;  Service: General;  Laterality: N/A;   COLOSTOMY TAKEDOWN N/A 03/21/2017   Procedure: LAPAROSCOPIC COLOSTOMY TAKEDOWN;  Surgeon: Clayburn Pert, MD;  Location: [facility] ORS;  Service: General;  Laterality: N/A;   CYSTOSCOPY WITH STENT PLACEMENT Bilateral 03/21/2017    Procedure: CYSTOSCOPY WITH LIGHTED STENT PLACEMENT;  Surgeon: Abbie Sons, MD;  Location: [facility] ORS;  Service: Urology;  Laterality: Bilateral;   ESOPHAGOGASTRODUODENOSCOPY     ESOPHAGOGASTRODUODENOSCOPY N/A 05/30/2016   Procedure: ESOPHAGOGASTRODUODENOSCOPY (EGD);  Surgeon: Lollie Sails, MD;  Location: [facility] ENDOSCOPY;  Service: Endoscopy;  Laterality: N/A;   EYE SURGERY     cataracts   FLEXIBLE SIGMOIDOSCOPY N/A 03/21/2017   Procedure: FLEXIBLE SIGMOIDOSCOPY;  Surgeon: Clayburn Pert, MD;  Location: [facility] ORS;  Service: General;  Laterality: N/A;   FRACTURE SURGERY Bilateral    right arm and left wrist   INCISION AND DRAINAGE ABSCESS N/A 12/07/2016   Procedure: DRAINAGE  OF INTRA ABDOMINAL ABSCESS;  Surgeon: Clayburn Pert, MD;  Location: [facility] ORS;  Service: General;  Laterality: N/A;   KNEE ARTHROSCOPY     LAPAROTOMY N/A 12/07/2016   Procedure: EXPLORATORY LAPAROTOMY;  Surgeon: Clayburn Pert, MD;  Location: [facility] ORS;  Service: General;  Laterality: N/A;   PROSTATE SURGERY     Microwave therapy   TONSILLECTOMY     TOTAL KNEE ARTHROPLASTY Right 07/27/2016   Procedure: TOTAL KNEE ARTHROPLASTY;  Surgeon: [name], MD;  Location: [facility] ORS;  Service: Orthopedics;  Laterality: Right;  Dr. Erlene Quan had to place Urinary catheter due to prostate cancer history.  Using flexible scope.    Current Meds  Medication Sig   aspirin 81 MG EC tablet Take 81 mg by mouth daily.    atorvastatin (LIPITOR) 40 MG tablet Take 1 tablet (40 mg total) by mouth daily.   cetirizine (ZYRTEC) 10 MG tablet Take 1 tablet (10 mg total) by mouth daily.   Cyanocobalamin (VITAMIN B 12 PO) Take 1,000 mcg by mouth daily.   docusate sodium (STOOL SOFTENER) 100 MG capsule Take 1 capsule (100 mg total) by mouth daily.   fluticasone (FLONASE) 50 MCG/ACT nasal spray PLACE 1 SPRAY INTO BOTH NOSTRILS DAILY AS NEEDED FOR ALLERGIES OR RHINITIS.   furosemide (LASIX) 20 MG tablet TAKE 1 TABLET BY MOUTH  EVERY DAY   hydrocortisone 2.5 % cream APPLY TO AFFECTED AREA TWICE A DAY AS NEEDED FOR RASH   lisinopril (ZESTRIL) 40 MG tablet Take 1 tablet (40 mg total) by mouth daily.   meclizine (ANTIVERT) 25 MG tablet Take 25 mg by mouth 2 (two) times daily as needed.   meloxicam (MOBIC) 15 MG tablet Take 1 tablet (15 mg total) by mouth daily as needed for pain.   Multiple Vitamin (MULTIVITAMIN WITH MINERALS) TABS tablet Take 1 tablet by mouth daily.   omeprazole (PRILOSEC) 20 MG capsule TAKE 1 CAPSULE (20 MG TOTAL) BY MOUTH DAILY AS NEEDED (HEARTBURN).   sertraline (ZOLOFT) 100 MG tablet TAKE 1 TABLET BY MOUTH EVERY DAY   [DISCONTINUED] metoprolol succinate (TOPROL-XL) 50 MG 24 hr tablet TAKE 1 TABLET (50 MG TOTAL) BY MOUTH DAILY. TAKE WITH OR IMMEDIATELY FOLLOWING A MEAL.    Allergies: Formaldehyde and Tape  Social History   Tobacco Use   Smoking status: Former Smoker    Packs/day: 1.00    Years: 27.00    Pack years: 27.00    Types: Cigarettes    Quit date: 04/18/1988  Years since quitting: 31.8   Smokeless tobacco: Former Systems developer    Types: Nurse, children's Use: Never used  Substance Use Topics   Alcohol use: Yes    Alcohol/week: 3.0 standard drinks    Types: 3 Standard drinks or equivalent per week    Comment: Occasional- Former Heavy ETOH Use   Drug use: No    Family History  Problem Relation Age of Onset   Lung cancer Father        smoker   Other Mother    Sudden death Son        due to "Blood clots"   Bipolar disorder Son    Heart disease Son    Kidney disease Daughter        congenital one small kidney   Prostate cancer Neg Hx    Bladder Cancer Neg Hx     Review of Systems: A 12-system review of systems was performed and was negative except as noted in the HPI.  --------------------------------------------------------------------------------------------------  Physical Exam: BP 134/84 (BP Location: Left Arm, Patient Position: Sitting,  Cuff Size: Normal)    Pulse 76    Ht 5' 8"  (1.727 m)    Wt 217 lb (98.4 kg)    SpO2 95%    BMI 32.99 kg/m   General: NAD. HEENT: No conjunctival pallor or scleral icterus. Facemask in place. Neck: Supple without lymphadenopathy, thyromegaly, JVD, or HJR. Lungs: Normal work of breathing. Clear to auscultation bilaterally without wheezes or crackles. Heart: Regular rate and rhythm without murmurs, rubs, or gallops. Abd: Bowel sounds present. Soft, NT/ND. Ext: 1+ right calf edema.  No significant left lower extremity edema.  EKG: Sinus rhythm with PACs and bifascicular block (RBBB and LAFB).  Compared with prior tracing from 07/31/2019, QRS has widened.  PACs are also present.  Lab Results  Component Value Date   WBC 6.0 06/10/2019   HGB 11.5 (L) 06/10/2019   HCT 36.1 (L) 06/10/2019   MCV 91.4 06/10/2019   PLT 213 06/10/2019    Lab Results  Component Value Date   NA 144 08/15/2019   K 4.1 08/15/2019   CL 106 08/15/2019   CO2 22 08/15/2019   BUN 17 08/15/2019   CREATININE 1.27 08/15/2019   GLUCOSE 93 08/15/2019   ALT 15 06/08/2019    Lab Results  Component Value Date   CHOL 156 08/15/2019   HDL 46 08/15/2019   LDLCALC 85 08/15/2019   TRIG 144 08/15/2019   CHOLHDL 4.4 06/09/2019    --------------------------------------------------------------------------------------------------  ASSESSMENT AND PLAN: Dyspnea on exertion: This has been a longstanding issue for Cameron Foster.  Echocardiogram in February did not reveal any significant abnormalities.  There is certainly potential for underlying ischemic heart disease, though myocardial perfusion stress test in 2017 was normal.  We discussed repeating ischemia evaluation, though Cameron Foster wishes to defer this.  We have agreed to switch from metoprolol succinate to bisoprolol 5 mg daily to see if this offers him any improvement.  Paroxysmal atrial fibrillation: Cameron Foster has a history of a single episode of atrial fibrillation over  10 years ago.  There certainly has been concern about recurrent atrial fibrillation in the setting of recent stroke, though none was identified by ambulatory cardiac monitoring.  We previously discussed referral for loop recorder placement, which Cameron Foster declined.  He continues to decline today.  Given history of subarachnoid hemorrhage, I reluctant to start anticoagulation.  If A. fib is identified in the future, left  atrial appendage occlusion may be a viable option.  However, in the absence of objective evidence of recent atrial fibrillation, I do not think that we can justify the risk of invasive procedures.  For now, we will continue with aspirin and statin therapy.  Bifascicular block: EKG today demonstrates bifascicular block with interval widening of QRS complex since April.  Cameron Foster does not have any symptoms of high-grade AV or infranodal block, though we will need to monitor closely for this.  Right lower extremity edema: Chronic and stable without significant improvement after discontinuation of amlodipine.  We will continue furosemide 20 mg daily.  I agree with vascular surgery evaluation.  Follow-up: Return to clinic in 3 months.  [name], MD 01/30/2020 7:37 AM

## 2020-01-30 ENCOUNTER — Encounter: Payer: Self-pay | Admitting: Internal Medicine

## 2020-02-05 ENCOUNTER — Ambulatory Visit (INDEPENDENT_AMBULATORY_CARE_PROVIDER_SITE_OTHER): Payer: Medicare Other | Admitting: Dermatology

## 2020-02-05 ENCOUNTER — Other Ambulatory Visit: Payer: Self-pay

## 2020-02-05 ENCOUNTER — Encounter: Payer: Self-pay | Admitting: Dermatology

## 2020-02-05 DIAGNOSIS — C44319 Basal cell carcinoma of skin of other parts of face: Secondary | ICD-10-CM | POA: Diagnosis not present

## 2020-02-05 DIAGNOSIS — C44321 Squamous cell carcinoma of skin of nose: Secondary | ICD-10-CM

## 2020-02-05 DIAGNOSIS — C4492 Squamous cell carcinoma of skin, unspecified: Secondary | ICD-10-CM

## 2020-02-05 DIAGNOSIS — I639 Cerebral infarction, unspecified: Secondary | ICD-10-CM | POA: Diagnosis not present

## 2020-02-05 NOTE — Patient Instructions (Signed)
Wound Care Instructions  1. Cleanse wound gently with soap and water once a day then pat dry with clean gauze. Apply a thing coat of Petrolatum (petroleum jelly, "Vaseline") over the wound (unless you have an allergy to this). We recommend that you use a new, sterile tube of Vaseline. Do not pick or remove scabs. Do not remove the yellow or [place] "healing tissue" from the base of the wound.  2. Cover the wound with fresh, clean, nonstick gauze and secure with paper tape. You may use Band-Aids in place of gauze and tape if the would is small enough, but would recommend trimming much of the tape off as there is often too much. Sometimes Band-Aids can irritate the skin.  3. If you experience any problems, such as abnormal amounts of bleeding, swelling, significant bruising, significant pain, or evidence of infection, please call the office immediately.

## 2020-02-05 NOTE — Progress Notes (Signed)
   Follow-Up Visit   Subjective  Cameron Foster. is a 79 y.o. male who presents for the following: Follow-up. Patient is here for [facility] treatment of BCC of the left upper eyebrow. He has MOHS surgery on SCC of the mid nasal dorsum on 01/09/2020. He is currently using Efudex Cream to this area twice a day x 3 weeks.  He has one week left to treat.   The following portions of the chart were reviewed this encounter and updated as appropriate:      Review of Systems:  No other skin or systemic complaints except as noted in HPI or Assessment and Plan.  Objective  Well appearing patient in no apparent distress; mood and affect are within normal limits.  A focused examination was performed including face. Relevant physical exam findings are noted in the Assessment and Plan.  Objective  Left Upper Eyebrow: Pink biopsy site.  Objective  Mid Nasal Dorsum: Significant erythema with vesiculation and crusting on entire nose.      Assessment & Plan    Basal cell carcinoma (BCC) of skin of other part of face Left Upper Eyebrow  Destruction of lesion  Destruction method: electrodesiccation and curettage   Informed consent: discussed and consent obtained   Timeout:  patient name, date of birth, surgical site, and procedure verified Anesthesia: the lesion was anesthetized in a standard fashion   Anesthetic:  1% lidocaine w/ epinephrine 1-100,000 buffered w/ 8.4% NaHCO3 Curettage performed in three different directions: Yes   Electrodesiccation performed over the curetted area: Yes   Lesion length (cm):  0.6 Lesion width (cm):  0.6 Margin per side (cm):  0.2 Final wound size (cm):  1 Hemostasis achieved with:  pressure, aluminum chloride and electrodesiccation Outcome: patient tolerated procedure well with no complications   Post-procedure details: wound care instructions given   Additional details:  Mupirocin ointment and Bandaid applied    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 He is on week 3.  Continue vaseline prn  Return in about 3 months (around 05/07/2020) for Hx BCC/SCC.   Documentation: I have reviewed the above documentation for accuracy and completeness, and I agree with the above.  [name] MD

## 2020-02-06 ENCOUNTER — Ambulatory Visit (INDEPENDENT_AMBULATORY_CARE_PROVIDER_SITE_OTHER): Payer: Medicare Other | Admitting: Vascular Surgery

## 2020-02-06 ENCOUNTER — Encounter (INDEPENDENT_AMBULATORY_CARE_PROVIDER_SITE_OTHER): Payer: Self-pay | Admitting: Vascular Surgery

## 2020-02-06 VITALS — BP 170/88 | HR 76 | Resp 16 | Wt 223.6 lb

## 2020-02-06 DIAGNOSIS — R600 Localized edema: Secondary | ICD-10-CM | POA: Diagnosis not present

## 2020-02-06 DIAGNOSIS — I480 Paroxysmal atrial fibrillation: Secondary | ICD-10-CM | POA: Diagnosis not present

## 2020-02-06 DIAGNOSIS — E785 Hyperlipidemia, unspecified: Secondary | ICD-10-CM | POA: Diagnosis not present

## 2020-02-06 DIAGNOSIS — I639 Cerebral infarction, unspecified: Secondary | ICD-10-CM | POA: Diagnosis not present

## 2020-02-06 DIAGNOSIS — M7989 Other specified soft tissue disorders: Secondary | ICD-10-CM

## 2020-02-06 DIAGNOSIS — M48061 Spinal stenosis, lumbar region without neurogenic claudication: Secondary | ICD-10-CM | POA: Diagnosis not present

## 2020-02-07 ENCOUNTER — Telehealth: Payer: Self-pay

## 2020-02-07 ENCOUNTER — Telehealth: Payer: Self-pay | Admitting: Internal Medicine

## 2020-02-07 NOTE — Telephone Encounter (Signed)
If he is not tolerating bisoprolol well, I suggest he go back on metoprolol succinate 50 mg daily and follow his BP and symptoms.  He should let us know how his symptoms and BP are in 1 week.  [name], MD [facility]

## 2020-02-07 NOTE — Telephone Encounter (Signed)
Left voicemail message for patient to call back.

## 2020-02-07 NOTE — Telephone Encounter (Signed)
Spoke with patient and he reports elevated blood pressure. He also states that when he took his dog out that he bent down to pick up his poop and fell on his head in the grass. He denies any injuries but stated that it took him some time to get up off the ground. He feels like this is due to the new medication bisoprolol that was recently started. He would like to know if there are better options to take. Advised that I will certainly send this note over to provider for review and would call back with any recommendations. He was agreeable with no further questions at this time.

## 2020-02-07 NOTE — Telephone Encounter (Signed)
Pt c/o BP issue: STAT if pt c/o blurred vision, one-sided weakness or slurred speech  1. What are your last 5 BP readings?    172/101 today  210/130 last night   2. Are you having any other symptoms (ex. Dizziness, headache, blurred vision, passed out)? No   3. What is your BP issue?  Trending up after med change wants to go back to taking old meds   Please call to discuss.

## 2020-02-07 NOTE — Telephone Encounter (Signed)
Copied from [place] [id number]. Topic: General - Other >> Feb 07, 2020  9:58 AM Celene Kras wrote: Reason for CRM: Pt called stating that he went to cardiologist and that his medications have been changed. Pt is requesting to speak with nurse or PCP regarding these changes. Pt states that he has not taken his BP medication this morning. Please advise.

## 2020-02-09 ENCOUNTER — Encounter: Payer: Self-pay | Admitting: Family Medicine

## 2020-02-09 ENCOUNTER — Encounter (INDEPENDENT_AMBULATORY_CARE_PROVIDER_SITE_OTHER): Payer: Self-pay | Admitting: Vascular Surgery

## 2020-02-09 NOTE — Progress Notes (Signed)
MRN : [id number]  Cameron Foster. is a 79 y.o. (July 08, 1940) male who presents with chief complaint of  Chief Complaint  Patient presents with  . Follow-up    le swelling  .  History of Present Illness: Patient is seen for follow up evaluation of leg pain and leg swelling. The patient first noticed the swelling remotely. The swelling is associated with pain and discoloration. The pain and swelling worsens with prolonged dependency and improves with elevation. The pain is unrelated to activity.  The patient notes that in the morning the legs are significantly improved but they steadily worsened throughout the course of the day. The patient also notes a steady worsening of the discoloration in the ankle and shin area.   The patient denies claudication symptoms.  The patient denies symptoms consistent with rest pain.  The patient has a history of DJD and LS spine disease.  The patient has not had any past angiography, interventions or vascular surgery.  Elevation makes the leg symptoms better, dependency makes them much worse. There is no history of ulcerations. The patient denies any recent changes in medications.  The patient has not been wearing graduated compression.  The patient denies a history of DVT or PE. There is no prior history of phlebitis. There is no history of primary lymphedema.  No history of malignancies. No history of trauma or groin or pelvic surgery. There is no history of radiation treatment to the groin or pelvis  The patient denies amaurosis fugax or recent TIA symptoms. There are no recent neurological changes noted. The patient denies recent episodes of angina or shortness of breath  Current Meds  Medication Sig  . aspirin 81 MG EC tablet Take 81 mg by mouth daily.   . bisoprolol (ZEBETA) 5 MG tablet Take 1 tablet (5 mg total) by mouth daily.  . cetirizine (ZYRTEC) 10 MG tablet Take 1 tablet (10 mg total) by mouth daily.  . Cyanocobalamin (VITAMIN B 12  PO) Take 1,000 mcg by mouth daily.  Marland Kitchen docusate sodium (STOOL SOFTENER) 100 MG capsule Take 1 capsule (100 mg total) by mouth daily.  . fluticasone (FLONASE) 50 MCG/ACT nasal spray PLACE 1 SPRAY INTO BOTH NOSTRILS DAILY AS NEEDED FOR ALLERGIES OR RHINITIS.  . furosemide (LASIX) 20 MG tablet TAKE 1 TABLET BY MOUTH EVERY DAY  . hydrocortisone 2.5 % cream APPLY TO AFFECTED AREA TWICE A DAY AS NEEDED FOR RASH  . lisinopril (ZESTRIL) 40 MG tablet Take 1 tablet (40 mg total) by mouth daily.  . meclizine (ANTIVERT) 25 MG tablet Take 25 mg by mouth 2 (two) times daily as needed.  . meloxicam (MOBIC) 15 MG tablet Take 1 tablet (15 mg total) by mouth daily as needed for pain.  . Multiple Vitamin (MULTIVITAMIN WITH MINERALS) TABS tablet Take 1 tablet by mouth daily.  Marland Kitchen omeprazole (PRILOSEC) 20 MG capsule TAKE 1 CAPSULE (20 MG TOTAL) BY MOUTH DAILY AS NEEDED (HEARTBURN).  Marland Kitchen sertraline (ZOLOFT) 100 MG tablet TAKE 1 TABLET BY MOUTH EVERY DAY    Past Medical History:  Diagnosis Date  . Alcoholism ([place])   . Arthritis   . Atrial fibrillation ([place])    one episode  . Basal cell carcinoma 04/26/2017   Right medial cheek. Superficial and nodular  . Basal cell carcinoma 06/11/2019   Left anterior shoulder. Nodular pattern  . Basal cell carcinoma 09/09/2019   Right nasal ala, EDC  . Basal cell carcinoma 02/05/2020   L upper eyebrow, ED[facility]   .  Colon polyp   . Depression    Son died 2014-06-17  . Diverticulitis   . Diverticulosis 30 years  . Dysrhythmia    At Fib 1995  . GERD (gastroesophageal reflux disease)   . Hyperlipidemia   . Hypertension   . Irritable bowel syndrome   . Prostate cancer ([place]) June 17, 2010   treated with radiation therapy. Prostate  . Squamous cell carcinoma of skin 07/18/2017   Left medial calf. KA type  . Squamous cell carcinoma of skin 04/24/2018   Right above med. brow  . Squamous cell carcinoma of skin 06/11/2019   Right posterior shoulder. SCCis, hypertrophic  . Squamous cell  carcinoma of skin 01/09/2020   Mid nasal dorsum, MOHS  . Vasovagal syncope     Past Surgical History:  Procedure Laterality Date  . [place] no stents  . CATARACT EXTRACTION, BILATERAL    . COLON RESECTION SIGMOID N/A 12/07/2016   Procedure: COLON RESECTION SIGMOID;  Surgeon: Clayburn Pert, MD;  Location: [facility] ORS;  Service: General;  Laterality: N/A;  . COLON SURGERY  11/2016   Colostomy  . COLONOSCOPY  06/17/14  . COLONOSCOPY WITH PROPOFOL N/A 05/30/2016   Procedure: COLONOSCOPY WITH PROPOFOL;  Surgeon: Lollie Sails, MD;  Location: [facility] ENDOSCOPY;  Service: Endoscopy;  Laterality: N/A;  . COLOSTOMY Left 12/07/2016   Procedure: COLOSTOMY;  Surgeon: Clayburn Pert, MD;  Location: [facility] ORS;  Service: General;  Laterality: Left;  . COLOSTOMY REVERSAL N/A 03/21/2017   Procedure: COLOSTOMY REVERSAL;  Surgeon: Clayburn Pert, MD;  Location: [facility] ORS;  Service: General;  Laterality: N/A;  . COLOSTOMY TAKEDOWN N/A 03/21/2017   Procedure: LAPAROSCOPIC COLOSTOMY TAKEDOWN;  Surgeon: Clayburn Pert, MD;  Location: [facility] ORS;  Service: General;  Laterality: N/A;  . CYSTOSCOPY WITH STENT PLACEMENT Bilateral 03/21/2017   Procedure: CYSTOSCOPY WITH LIGHTED STENT PLACEMENT;  Surgeon: Abbie Sons, MD;  Location: [facility] ORS;  Service: Urology;  Laterality: Bilateral;  . ESOPHAGOGASTRODUODENOSCOPY    . ESOPHAGOGASTRODUODENOSCOPY N/A 05/30/2016   Procedure: ESOPHAGOGASTRODUODENOSCOPY (EGD);  Surgeon: Lollie Sails, MD;  Location: [facility] ENDOSCOPY;  Service: Endoscopy;  Laterality: N/A;  . EYE SURGERY     cataracts  . FLEXIBLE SIGMOIDOSCOPY N/A 03/21/2017   Procedure: FLEXIBLE SIGMOIDOSCOPY;  Surgeon: Clayburn Pert, MD;  Location: [facility] ORS;  Service: General;  Laterality: N/A;  . FRACTURE SURGERY Bilateral    right arm and left wrist  . INCISION AND DRAINAGE ABSCESS N/A 12/07/2016   Procedure: DRAINAGE  OF INTRA ABDOMINAL ABSCESS;  Surgeon: Clayburn Pert,  MD;  Location: [facility] ORS;  Service: General;  Laterality: N/A;  . KNEE ARTHROSCOPY    . LAPAROTOMY N/A 12/07/2016   Procedure: EXPLORATORY LAPAROTOMY;  Surgeon: Clayburn Pert, MD;  Location: [facility] ORS;  Service: General;  Laterality: N/A;  . PROSTATE SURGERY     Microwave therapy  . TONSILLECTOMY    . TOTAL KNEE ARTHROPLASTY Right 07/27/2016   Procedure: TOTAL KNEE ARTHROPLASTY;  Surgeon: [name], MD;  Location: [facility] ORS;  Service: Orthopedics;  Laterality: Right;  Dr. Erlene Quan had to place Urinary catheter due to prostate cancer history.  Using flexible scope.    Social History Social History   Tobacco Use  . Smoking status: Former Smoker    Packs/day: 1.00    Years: 27.00    Pack years: 27.00    Types: Cigarettes    Quit date: 04/18/1988    Years since quitting: 31.8  . Smokeless tobacco: Former Systems developer    Types:  Chew  Vaping Use  . Vaping Use: Never used  Substance Use Topics  . Alcohol use: Yes    Alcohol/week: 3.0 standard drinks    Types: 3 Standard drinks or equivalent per week    Comment: Occasional- Former Heavy ETOH Use  . Drug use: No    Family History Family History  Problem Relation Age of Onset  . Lung cancer Father        smoker  . Other Mother   . Sudden death Son        due to "Blood clots"  . Bipolar disorder Son   . Heart disease Son   . Kidney disease Daughter        congenital one small kidney  . Prostate cancer Neg Hx   . Bladder Cancer Neg Hx     Allergies  Allergen Reactions  . Formaldehyde Rash  . Tape Rash    Pt states he is not allergic to tape.     REVIEW OF SYSTEMS (Negative unless checked)  Constitutional: [] Weight loss  [] Fever  [] Chills Cardiac: [] Chest pain   [] Chest pressure   [] Palpitations   [] Shortness of breath when laying flat   [] Shortness of breath with exertion. Vascular:  [x] Pain in legs with walking   [x] Pain in legs at rest  [] History of DVT   [] Phlebitis   [x] Swelling in legs   [] Varicose veins   [] Non-healing  ulcers Pulmonary:   [] Uses home oxygen   [] Productive cough   [] Hemoptysis   [] Wheeze  [] COPD   [] Asthma Neurologic:  [] Dizziness   [] Seizures   [] History of stroke   [] History of TIA  [] Aphasia   [] Vissual changes   [] Weakness or numbness in arm   [] Weakness or numbness in leg Musculoskeletal:   [] Joint swelling   [x] Joint pain   [x] Low back pain Hematologic:  [] Easy bruising  [] Easy bleeding   [] Hypercoagulable state   [] Anemic Gastrointestinal:  [] Diarrhea   [] Vomiting  [] Gastroesophageal reflux/heartburn   [] Difficulty swallowing. Genitourinary:  [] Chronic kidney disease   [] Difficult urination  [] Frequent urination   [] Blood in urine Skin:  [] Rashes   [] Ulcers  Psychological:  [] History of anxiety   []  History of major depression.  Physical Examination  Vitals:   02/06/20 1056  BP: (!) 170/88  Pulse: 76  Resp: 16  Weight: 223 lb 9.6 oz (101.4 kg)   Body mass index is 34 kg/m. Gen: WD/WN, NAD Head: /AT, No temporalis wasting.  Ear/Nose/Throat: Hearing grossly intact, nares w/o erythema or drainage Eyes: PER, EOMI, sclera nonicteric.  Neck: Supple, no large masses.   Pulmonary:  Good air movement, no audible wheezing bilaterally, no use of accessory muscles.  Cardiac: RRR, no JVD Vascular:scattered varicosities present bilaterally.  Moderate venous stasis changes to the legs bilaterally.  4+ soft pitting edema. Vessel Right Left  Radial Palpable Palpable  PT Palpable Palpable  DP Palpable Palpable  Gastrointestinal: Non-distended. No guarding/no peritoneal signs.  Musculoskeletal: M/S 5/5 throughout.  No deformity or atrophy.  Neurologic: CN 2-12 intact. Symmetrical.  Speech is fluent. Motor exam as listed above. Psychiatric: Judgment intact, Mood & affect appropriate for pt's clinical situation. Dermatologic: Moderate venous rashes no ulcers noted.  No changes consistent with cellulitis. Lymph : Mild lichenification and skin changes of chronic lymphedema.  CBC Lab  Results  Component Value Date   WBC 6.0 06/10/2019   HGB 11.5 (L) 06/10/2019   HCT 36.1 (L) 06/10/2019   MCV 91.4 06/10/2019   PLT 213 06/10/2019  BMET    Component Value Date/Time   NA 144 08/15/2019 0832   K 4.1 08/15/2019 0832   CL 106 08/15/2019 0832   CO2 22 08/15/2019 0832   GLUCOSE 93 08/15/2019 0832   GLUCOSE 101 (H) 06/10/2019 0335   BUN 17 08/15/2019 0832   CREATININE 1.27 08/15/2019 0832   CREATININE 1.40 (H) 08/12/2015 1632   CALCIUM 9.5 08/15/2019 0832   GFRNONAA 54 (L) 08/15/2019 0832   GFRAA 62 08/15/2019 0832   CrCl cannot be calculated (Patient's most recent lab result is older than the maximum 21 days allowed.).  COAG Lab Results  Component Value Date   INR 1.0 06/08/2019   INR 1.0 10/20/2018   INR 1.1 10/10/2018    Radiology No results found.   Assessment/Plan 1. Swelling of right lower extremity I have had a long discussion with the patient regarding swelling and why it  causes symptoms.  Patient will begin wearing graduated compression stockings class 1 (20-30 mmHg) on a daily basis a prescription was given. The patient will  beginning wearing the stockings first thing in the morning and removing them in the evening. The patient is instructed specifically not to sleep in the stockings.   In addition, behavioral modification will be initiated.  This will include frequent elevation, use of over the counter pain medications and exercise such as walking.  I have reviewed systemic causes for chronic edema such as liver, kidney and cardiac etiologies.  The patient denies problems with these organ systems.    Consideration for a lymph pump will also be made based upon the effectiveness of conservative therapy.  This would help to improve the edema control and prevent sequela such as ulcers and infections   Patient should undergo duplex ultrasound of the venous system to ensure that DVT or reflux is not present.  The patient will follow-up with me  after the ultrasound.   - VAS [place] LOWER EXTREMITY VENOUS REFLUX; Future  2. Hyperlipidemia LDL goal <70 Continue statin as ordered and reviewed, no changes at this time   3. Foraminal stenosis of lumbar region Continue NSAID medications as already ordered, these medications have been reviewed and there are no changes at this time.  Continued activity and therapy was stressed.   4. Paroxysmal atrial fibrillation (HCC) Continue antiarrhythmia medications as already ordered, these medications have been reviewed and there are no changes at this time.  Continue anticoagulation as ordered by Cardiology Service     Hortencia Pilar, MD  02/09/2020 10:07 AM

## 2020-02-10 ENCOUNTER — Other Ambulatory Visit: Payer: Self-pay

## 2020-02-10 ENCOUNTER — Encounter: Payer: Self-pay | Admitting: Family Medicine

## 2020-02-10 ENCOUNTER — Telehealth (INDEPENDENT_AMBULATORY_CARE_PROVIDER_SITE_OTHER): Payer: Medicare Other | Admitting: Family Medicine

## 2020-02-10 DIAGNOSIS — I10 Essential (primary) hypertension: Secondary | ICD-10-CM

## 2020-02-10 MED ORDER — AMLODIPINE BESYLATE 5 MG PO TABS: 5.0000 mg | ORAL_TABLET | Freq: Every day | ORAL | 3 refills | Status: DC

## 2020-02-10 NOTE — Telephone Encounter (Signed)
Prior to our recent visit, several BP med changes were made by his PCP (amlodipine stopped and lisinopril increased to 40 mg daily).  As he is currently waiting to speak with his PCP, I will defer medication changes to her so that we do not make conflicting medication changes.  If he wishes for [place] to be the primary managers of his hypertension, I suggest that we schedule an appointment with an APP at his earliest convenience.  He should bring his BP cuff with him so that we can confirm that his home readings are accurate.  [name], MD [facility]

## 2020-02-10 NOTE — Telephone Encounter (Signed)
We can double book the 140 - ok for virtual or in person

## 2020-02-10 NOTE — Assessment & Plan Note (Signed)
Consistently elevated on home readings Slight headache, but no red flag symptoms Accelerated hypertension currently Has already switched back to metoprolol from bisoprolol LE edema did not improve with stopping amlodipine Continue lasix, lisinopril, and metoprolol at current doses Add amlodipine 63m daily Recheck in 2-4 weeks Discussed return precautions/emergent precautions

## 2020-02-10 NOTE — Telephone Encounter (Signed)
Spoke with patient and reviewed provider recommendations. He states that he discontinued bisoprolol back on Friday and went back on Metoprolol succinate 50 mg once daily. He states that it has not helped his blood pressures and reported as follows: Last night 196/105 This morning 177/99 3 hours ago 191/100  He is on hold for appointment with his primary care provider via zoom. Advised that I would send this information over to Dr. Saunders Revel for further review and that I would call him back later to see if his provider should add anything. He was agreeable with plan.

## 2020-02-10 NOTE — Progress Notes (Signed)
MyChart Video Visit    Virtual Visit via Video Note   This visit type was conducted due to national recommendations for restrictions regarding the COVID-19 Pandemic (e.g. social distancing) in an effort to limit this patient's exposure and mitigate transmission in our community. This patient is at least at moderate risk for complications without adequate follow up. This format is felt to be most appropriate for this patient at this time. Physical exam was limited by quality of the video and audio technology used for the visit.    Patient location: home Provider location: [place] involved in the visit: patient, provider  I discussed the limitations of evaluation and management by telemedicine and the availability of in person appointments. The patient expressed understanding and agreed to proceed.    Patient: Cameron Foster.   DOB: 09/24/1940   79 y.o. Male  MRN: [id number] Visit Date: 02/10/2020  Today's healthcare provider: Lavon Paganini, MD   Chief Complaint  Patient presents with  . Hypertension   Subjective    HPI  Hypertension, follow-up  BP Readings from Last 3 Encounters:  02/06/20 (!) 170/88  01/29/20 134/84  01/14/20 127/82   Wt Readings from Last 3 Encounters:  02/06/20 223 lb 9.6 oz (101.4 kg)  01/29/20 217 lb (98.4 kg)  01/14/20 217 lb (98.4 kg)     Pt states at his last cardiology apt is medication was changed from metoprolol 53m to bisoprolol 570mdaily.  He says since then is blood pressures at home have been elevated.  He recently changed back to metoprolol but is BP is still too high.  He reports excellent compliance with treatment.  He is not having side effects.  He does not smoke.  Use of agents associated with hypertension: none.   Outside blood pressures are elevated at home. Symptoms: No chest pain No chest pressure  No palpitations No syncope  No dyspnea No orthopnea  No paroxysmal nocturnal dyspnea No  lower extremity edema    Has a headache but no visual changes, chest pain, SOB. Pertinent labs: Lab Results  Component Value Date   CHOL 156 08/15/2019   HDL 46 08/15/2019   LDLCALC 85 08/15/2019   TRIG 144 08/15/2019   CHOLHDL 4.4 06/09/2019   Lab Results  Component Value Date   NA 144 08/15/2019   K 4.1 08/15/2019   CREATININE 1.27 08/15/2019   GFRNONAA 54 (L) 08/15/2019   GFRAA 62 08/15/2019   GLUCOSE 93 08/15/2019     The ASCVD Risk score (Goff DC Jr., et al., 2013) failed to calculate for the following reasons:   The patient has a prior MI or stroke diagnosis   ---------------------------------------------------------------------------------------------------   Patient Active Problem List   Diagnosis Date Noted  . Foraminal stenosis of lumbar region 11/08/2019  . Chronic bilateral low back pain without sciatica 10/18/2019  . Foraminal stenosis of cervical region 10/18/2019  . PSVT (paroxysmal supraventricular tachycardia) ([facility]/15/2021  . Lacunar infarction ([facility]03/24/2021  . History of lacunar cerebrovascular accident (CVA) 07/01/2019  . Cerebrovascular accident (CVA) ([facility]02/20/2021  . Paroxysmal atrial fibrillation ([place]07/22/2020  . TIA (transient ischemic attack) 10/20/2018  . History of subarachnoid hemorrhage 10/16/2018  . Recurrent sinusitis 10/16/2018  . Low libido 08/30/2018  . Obesity 05/14/2018  . Hyperlipidemia LDL goal <70 05/14/2018  . Degeneration of lumbar intervertebral disc 03/22/2018  . Osteoarthritis of hip 03/22/2018  . Trochanteric bursitis of right hip 03/22/2018  . Status post partial colectomy  01/03/2017  . Depression, major, single episode, mild ([place]) 01/03/2017  . Vasovagal syncope 01/27/2016  . History of skin cancer 10/28/2015  . Osteoarthritis of right knee 10/01/2015  . Essential hypertension 08/25/2015  . Dyspnea on exertion 08/12/2015  . Erectile dysfunction following radiation therapy 08/04/2015  . Constipation  08/02/2015  . History of prostate cancer 01/20/2015  . Swelling of right lower extremity 01/20/2015   Past Medical History:  Diagnosis Date  . Alcoholism ([place])   . Arthritis   . Atrial fibrillation ([place])    one episode  . Basal cell carcinoma 04/26/2017   Right medial cheek. Superficial and nodular  . Basal cell carcinoma 06/11/2019   Left anterior shoulder. Nodular pattern  . Basal cell carcinoma 09/09/2019   Right nasal ala, EDC  . Basal cell carcinoma 02/05/2020   L upper eyebrow, EDC   . Colon polyp   . Depression    Son died 07-12-2014  . Diverticulitis   . Diverticulosis 30 years  . Dysrhythmia    At Fib 1995  . GERD (gastroesophageal reflux disease)   . Hyperlipidemia   . Hypertension   . Irritable bowel syndrome   . Prostate cancer ([place]) 07/12/2010   treated with radiation therapy. Prostate  . Squamous cell carcinoma of skin 07/18/2017   Left medial calf. KA type  . Squamous cell carcinoma of skin 04/24/2018   Right above med. brow  . Squamous cell carcinoma of skin 06/11/2019   Right posterior shoulder. SCCis, hypertrophic  . Squamous cell carcinoma of skin 01/09/2020   Mid nasal dorsum, MOHS  . Vasovagal syncope    Social History   Tobacco Use  . Smoking status: Former Smoker    Packs/day: 1.00    Years: 27.00    Pack years: 27.00    Types: Cigarettes    Quit date: 04/18/1988    Years since quitting: 31.8  . Smokeless tobacco: Former Systems developer    Types: Secondary school teacher  . Vaping Use: Never used  Substance Use Topics  . Alcohol use: Yes    Alcohol/week: 3.0 standard drinks    Types: 3 Standard drinks or equivalent per week    Comment: Occasional- Former Heavy ETOH Use  . Drug use: No   Allergies  Allergen Reactions  . Formaldehyde Rash  . Tape Rash    Pt states he is not allergic to tape.    Medications: Outpatient Medications Prior to Visit  Medication Sig  . aspirin 81 MG EC tablet Take 81 mg by mouth daily.   . cetirizine (ZYRTEC) 10 MG tablet Take 1  tablet (10 mg total) by mouth daily.  . Cyanocobalamin (VITAMIN B 12 PO) Take 1,000 mcg by mouth daily.  . furosemide (LASIX) 20 MG tablet TAKE 1 TABLET BY MOUTH EVERY DAY  . lisinopril (ZESTRIL) 40 MG tablet Take 1 tablet (40 mg total) by mouth daily.  . meclizine (ANTIVERT) 25 MG tablet Take 25 mg by mouth 2 (two) times daily as needed.  . meloxicam (MOBIC) 15 MG tablet Take 1 tablet (15 mg total) by mouth daily as needed for pain.  . metoprolol succinate (TOPROL-XL) 50 MG 24 hr tablet Take 50 mg by mouth daily. Take with or immediately following a meal.  . Multiple Vitamin (MULTIVITAMIN WITH MINERALS) TABS tablet Take 1 tablet by mouth daily.  Marland Kitchen omeprazole (PRILOSEC) 20 MG capsule TAKE 1 CAPSULE (20 MG TOTAL) BY MOUTH DAILY AS NEEDED (HEARTBURN).  Marland Kitchen sertraline (ZOLOFT) 100 MG tablet TAKE 1  TABLET BY MOUTH EVERY DAY  . [DISCONTINUED] docusate sodium (STOOL SOFTENER) 100 MG capsule Take 1 capsule (100 mg total) by mouth daily.  Marland Kitchen atorvastatin (LIPITOR) 40 MG tablet Take 1 tablet (40 mg total) by mouth daily.  . hydrocortisone 2.5 % cream APPLY TO AFFECTED AREA TWICE A DAY AS NEEDED FOR RASH  . [DISCONTINUED] bisoprolol (ZEBETA) 5 MG tablet Take 1 tablet (5 mg total) by mouth daily. (Patient not taking: Reported on 02/10/2020)  . [DISCONTINUED] fluticasone (FLONASE) 50 MCG/ACT nasal spray PLACE 1 SPRAY INTO BOTH NOSTRILS DAILY AS NEEDED FOR ALLERGIES OR RHINITIS. (Patient not taking: Reported on 02/10/2020)   No facility-administered medications prior to visit.    Review of Systems  Constitutional: Negative.   HENT: Positive for congestion and sinus pressure.   Respiratory: Negative.   Cardiovascular: Negative.   Neurological: Positive for headaches. Negative for dizziness, tremors, seizures, syncope, facial asymmetry, speech difficulty, weakness, light-headedness and numbness.  Psychiatric/Behavioral: Negative.       Objective    There were no vitals taken for this  visit.   Physical Exam Constitutional:      General: He is not in acute distress.    Appearance: Normal appearance.  HENT:     Head: Normocephalic and atraumatic.  Pulmonary:     Effort: Pulmonary effort is normal. No respiratory distress.  Neurological:     Mental Status: He is alert and oriented to person, place, and time. Mental status is at baseline.  Psychiatric:        Mood and Affect: Mood normal.        Behavior: Behavior normal.        Assessment & Plan     Problem List Items Addressed This Visit      Cardiovascular and Mediastinum   Essential hypertension - Primary    Consistently elevated on home readings Slight headache, but no red flag symptoms Accelerated hypertension currently Has already switched back to metoprolol from bisoprolol LE edema did not improve with stopping amlodipine Continue lasix, lisinopril, and metoprolol at current doses Add amlodipine 37m daily Recheck in 2-4 weeks Discussed return precautions/emergent precautions      Relevant Medications   metoprolol succinate (TOPROL-XL) 50 MG 24 hr tablet   amLODipine (NORVASC) 5 MG tablet    Other Visit Diagnoses    Accelerated hypertension       Relevant Medications   metoprolol succinate (TOPROL-XL) 50 MG 24 hr tablet   amLODipine (NORVASC) 5 MG tablet       Return in about 4 weeks (around 03/09/2020) for BP f/u.     I discussed the assessment and treatment plan with the patient. The patient was provided an opportunity to ask questions and all were answered. The patient agreed with the plan and demonstrated an understanding of the instructions.   The patient was advised to call back or seek an in-person evaluation if the symptoms worsen or if the condition fails to improve as anticipated.   I, ALavon Paganini MD, have reviewed all documentation for this visit. The documentation on 02/10/20 for the exam, diagnosis, procedures, and orders are all accurate and  complete.   Riot Waterworth, ADionne Bucy MD, MPH [facility]

## 2020-02-10 NOTE — Telephone Encounter (Signed)
Apt for 1:40 today.   Thanks,   -Mickel Baas

## 2020-02-10 NOTE — Patient Instructions (Addendum)
Continue current medicines Add amlodipine 5 mg daily   Hypertension, Adult Hypertension is another name for high blood pressure. High blood pressure forces your heart to work harder to pump blood. This can cause problems over time. There are two numbers in a blood pressure reading. There is a top number (systolic) over a bottom number (diastolic). It is best to have a blood pressure that is below 120/80. Healthy choices can help lower your blood pressure, or you may need medicine to help lower it. What are the causes? The cause of this condition is not known. Some conditions may be related to high blood pressure. What increases the risk?  Smoking.  Having type 2 diabetes mellitus, high cholesterol, or both.  Not getting enough exercise or physical activity.  Being overweight.  Having too much fat, sugar, calories, or salt (sodium) in your diet.  Drinking too much alcohol.  Having long-term (chronic) kidney disease.  Having a family history of high blood pressure.  Age. Risk increases with age.  Race. You may be at higher risk if you are African American.  Gender. Men are at higher risk than women before age 36. After age 10, women are at higher risk than men.  Having obstructive sleep apnea.  Stress. What are the signs or symptoms?  High blood pressure may not cause symptoms. Very high blood pressure (hypertensive crisis) may cause: ? Headache. ? Feelings of worry or nervousness (anxiety). ? Shortness of breath. ? Nosebleed. ? A feeling of being sick to your stomach (nausea). ? Throwing up (vomiting). ? Changes in how you see. ? Very bad chest pain. ? Seizures. How is this treated?  This condition is treated by making healthy lifestyle changes, such as: ? Eating healthy foods. ? Exercising more. ? Drinking less alcohol.  Your health care provider may prescribe medicine if lifestyle changes are not enough to get your blood pressure under control, and if: ? Your  top number is above 130. ? Your bottom number is above 80.  Your personal target blood pressure may vary. Follow these instructions at home: Eating and drinking   If told, follow the DASH eating plan. To follow this plan: ? Fill one half of your plate at each meal with fruits and vegetables. ? Fill one fourth of your plate at each meal with whole grains. Whole grains include whole-wheat pasta, brown rice, and whole-grain bread. ? Eat or drink low-fat dairy products, such as skim milk or low-fat yogurt. ? Fill one fourth of your plate at each meal with low-fat (lean) proteins. Low-fat proteins include fish, chicken without skin, eggs, beans, and tofu. ? Avoid fatty meat, cured and processed meat, or chicken with skin. ? Avoid pre-made or processed food.  Eat less than 1,500 mg of salt each day.  Do not drink alcohol if: ? Your doctor tells you not to drink. ? You are pregnant, may be pregnant, or are planning to become pregnant.  If you drink alcohol: ? Limit how much you use to:  0-1 drink a day for women.  0-2 drinks a day for men. ? Be aware of how much alcohol is in your drink. In the [place] one drink equals one 12 oz bottle of beer (355 mL), one 5 oz glass of wine (148 mL), or one 1 oz glass of hard liquor (44 mL). Lifestyle   Work with your doctor to stay at a healthy weight or to lose weight. Ask your doctor what the best weight is for  you.  Get at least 30 minutes of exercise most days of the week. This may include walking, swimming, or biking.  Get at least 30 minutes of exercise that strengthens your muscles (resistance exercise) at least 3 days a week. This may include lifting weights or doing Pilates.  Do not use any products that contain nicotine or tobacco, such as cigarettes, e-cigarettes, and chewing tobacco. If you need help quitting, ask your doctor.  Check your blood pressure at home as told by your doctor.  Keep all follow-up visits as told by your doctor.  This is important. Medicines  Take over-the-counter and prescription medicines only as told by your doctor. Follow directions carefully.  Do not skip doses of blood pressure medicine. The medicine does not work as well if you skip doses. Skipping doses also puts you at risk for problems.  Ask your doctor about side effects or reactions to medicines that you should watch for. Contact a doctor if you:  Think you are having a reaction to the medicine you are taking.  Have headaches that keep coming back (recurring).  Feel dizzy.  Have swelling in your ankles.  Have trouble with your vision. Get help right away if you:  Get a very bad headache.  Start to feel mixed up (confused).  Feel weak or numb.  Feel faint.  Have very bad pain in your: ? Chest. ? Belly (abdomen).  Throw up more than once.  Have trouble breathing. Summary  Hypertension is another name for high blood pressure.  High blood pressure forces your heart to work harder to pump blood.  For most people, a normal blood pressure is less than 120/80.  Making healthy choices can help lower blood pressure. If your blood pressure does not get lower with healthy choices, you may need to take medicine. This information is not intended to replace advice given to you by your health care provider. Make sure you discuss any questions you have with your health care provider. Document Revised: 12/13/2017 Document Reviewed: 12/13/2017 Elsevier Patient Education  2020 [employer].

## 2020-02-10 NOTE — Telephone Encounter (Signed)
Spoke with patient and he states that he spoke with his primary care provider today and decreased his amlodipine to 5 mg. Discussed that if those changes did not help then we would be happy to assist but it would require appointment with provider here in our office in order to review and manage his blood pressure medications and check his machine to make sure those are accurate readings. He states that he will try the changes for now and would certainly give [place] a call back if he wants [place] to manage these medications and schedule appointment to be seen. He was appreciative for the call with no further questions at this time.

## 2020-02-13 ENCOUNTER — Encounter: Payer: Self-pay | Admitting: Internal Medicine

## 2020-02-13 ENCOUNTER — Other Ambulatory Visit: Payer: Self-pay

## 2020-02-13 ENCOUNTER — Ambulatory Visit (INDEPENDENT_AMBULATORY_CARE_PROVIDER_SITE_OTHER): Payer: Medicare Other | Admitting: Internal Medicine

## 2020-02-13 VITALS — BP 124/80 | HR 82 | Ht 68.0 in | Wt 215.5 lb

## 2020-02-13 DIAGNOSIS — R600 Localized edema: Secondary | ICD-10-CM | POA: Diagnosis not present

## 2020-02-13 DIAGNOSIS — I480 Paroxysmal atrial fibrillation: Secondary | ICD-10-CM

## 2020-02-13 DIAGNOSIS — I10 Essential (primary) hypertension: Secondary | ICD-10-CM

## 2020-02-13 DIAGNOSIS — R5383 Other fatigue: Secondary | ICD-10-CM

## 2020-02-13 NOTE — Patient Instructions (Signed)
Medication Instructions:  Your physician recommends that you continue on your current medications as directed. Please refer to the Current Medication list given to you today.  *If you need a refill on your cardiac medications before your next appointment, please call your pharmacy*  Follow-Up: At [facility], you and your health needs are our priority.  As part of our continuing mission to provide you with exceptional heart care, we have created designated Provider Care Teams.  These Care Teams include your primary Cardiologist (physician) and Advanced Practice Providers (APPs -  Physician Assistants and Nurse Practitioners) who all work together to provide you with the care you need, when you need it.  We recommend signing up for the patient portal called "MyChart".  Sign up information is provided on this After Visit Summary.  MyChart is used to connect with patients for Virtual Visits (Telemedicine).  Patients are able to view lab/test results, encounter notes, upcoming appointments, etc.  Non-urgent messages can be sent to your provider as well.   To learn more about what you can do with MyChart, go to [email or web address].    Your next appointment:   3 month(s)  The format for your next appointment:   In Person  Provider:   You may see DR Harrell Gave END or one of the following Advanced Practice Providers on your designated Care Team:    Murray Hodgkins, NP  Christell Faith, PA-C  Marrianne Mood, PA-C  Cadence Northwood, [place]

## 2020-02-13 NOTE — Progress Notes (Signed)
Follow-up Outpatient Visit Date: 02/13/2020  Primary Care Provider: [name], MD [address] [place] [address]  Chief Complaint: High blood pressure  HPI:  Mr. Streety is a 79 y.o. male with history of hypertension, hyperlipidemia, single episode of atrial fibrillation in 06-19-1993, subarachnoid hemorrhage of uncertain etiology complicated by more recent stroke, prostate cancer, and alcohol abuse, who presents for urgent evaluation of elevated blood pressures.  I saw Mr. Rohman about 2 weeks ago at which time he continued to complain of exertional dyspnea and fatigue.  We elected to transition from metoprolol to bisoprolol to see if this would help improve his symptoms.  Unfortunately, he did not feel any better but noticed marked elevation in his blood pressure at home.  Around the same time, several medication changes were made by his PCP in an attempt to improve his blood pressure control.  He has been getting this significantly elevated readings with systolic blood pressures around 190 mmHg.  Mr. Sahni has felt relatively well without chest pain or shortness of breath, though he has been having some pain in his nose following Mohs surgery and 5-FU treatment.  Mr. Kirksey is back on metoprolol succinate 50 mg daily.  He also continues on lisinopril 40 mg daily and amlodipine 5 mg daily.  --------------------------------------------------------------------------------------------------  Past Medical History:  Diagnosis Date  . Alcoholism ([place])   . Arthritis   . Atrial fibrillation ([place])    one episode  . Basal cell carcinoma 04/26/2017   Right medial cheek. Superficial and nodular  . Basal cell carcinoma 06/11/2019   Left anterior shoulder. Nodular pattern  . Basal cell carcinoma 09/09/2019   Right nasal ala, [facility]  . Basal cell carcinoma 02/05/2020   L upper eyebrow, [facility]   . Colon polyp   . Depression    Son died 19-Jun-2014  . Diverticulitis   . Diverticulosis 30  years  . Dysrhythmia    At Fib 1995  . GERD (gastroesophageal reflux disease)   . Hyperlipidemia   . Hypertension   . Irritable bowel syndrome   . Prostate cancer ([place]) June 19, 2010   treated with radiation therapy. Prostate  . Squamous cell carcinoma of skin 07/18/2017   Left medial calf. KA type  . Squamous cell carcinoma of skin 04/24/2018   Right above med. brow  . Squamous cell carcinoma of skin 06/11/2019   Right posterior shoulder. SCCis, hypertrophic  . Squamous cell carcinoma of skin 01/09/2020   Mid nasal dorsum, MOHS  . Vasovagal syncope    Past Surgical History:  Procedure Laterality Date  . [place] no stents  . CATARACT EXTRACTION, BILATERAL    . COLON RESECTION SIGMOID N/A 12/07/2016   Procedure: COLON RESECTION SIGMOID;  Surgeon: Clayburn Pert, MD;  Location: [facility] ORS;  Service: General;  Laterality: N/A;  . COLON SURGERY  11/2016   Colostomy  . COLONOSCOPY  2014-06-19  . COLONOSCOPY WITH PROPOFOL N/A 05/30/2016   Procedure: COLONOSCOPY WITH PROPOFOL;  Surgeon: Lollie Sails, MD;  Location: [facility] ENDOSCOPY;  Service: Endoscopy;  Laterality: N/A;  . COLOSTOMY Left 12/07/2016   Procedure: COLOSTOMY;  Surgeon: Clayburn Pert, MD;  Location: [facility] ORS;  Service: General;  Laterality: Left;  . COLOSTOMY REVERSAL N/A 03/21/2017   Procedure: COLOSTOMY REVERSAL;  Surgeon: Clayburn Pert, MD;  Location: [facility] ORS;  Service: General;  Laterality: N/A;  . COLOSTOMY TAKEDOWN N/A 03/21/2017   Procedure: LAPAROSCOPIC COLOSTOMY TAKEDOWN;  Surgeon: Clayburn Pert, MD;  Location: [facility] ORS;  Service: General;  Laterality: N/A;  . CYSTOSCOPY WITH STENT PLACEMENT Bilateral 03/21/2017   Procedure: CYSTOSCOPY WITH LIGHTED STENT PLACEMENT;  Surgeon: Abbie Sons, MD;  Location: [facility] ORS;  Service: Urology;  Laterality: Bilateral;  . ESOPHAGOGASTRODUODENOSCOPY    . ESOPHAGOGASTRODUODENOSCOPY N/A 05/30/2016   Procedure: ESOPHAGOGASTRODUODENOSCOPY (EGD);   Surgeon: Lollie Sails, MD;  Location: [facility] ENDOSCOPY;  Service: Endoscopy;  Laterality: N/A;  . EYE SURGERY     cataracts  . FLEXIBLE SIGMOIDOSCOPY N/A 03/21/2017   Procedure: FLEXIBLE SIGMOIDOSCOPY;  Surgeon: Clayburn Pert, MD;  Location: [facility] ORS;  Service: General;  Laterality: N/A;  . FRACTURE SURGERY Bilateral    right arm and left wrist  . INCISION AND DRAINAGE ABSCESS N/A 12/07/2016   Procedure: DRAINAGE  OF INTRA ABDOMINAL ABSCESS;  Surgeon: Clayburn Pert, MD;  Location: [facility] ORS;  Service: General;  Laterality: N/A;  . KNEE ARTHROSCOPY    . LAPAROTOMY N/A 12/07/2016   Procedure: EXPLORATORY LAPAROTOMY;  Surgeon: Clayburn Pert, MD;  Location: [facility] ORS;  Service: General;  Laterality: N/A;  . PROSTATE SURGERY     Microwave therapy  . TONSILLECTOMY    . TOTAL KNEE ARTHROPLASTY Right 07/27/2016   Procedure: TOTAL KNEE ARTHROPLASTY;  Surgeon: [name], MD;  Location: [facility] ORS;  Service: Orthopedics;  Laterality: Right;  Dr. Erlene Quan had to place Urinary catheter due to prostate cancer history.  Using flexible scope.    Current Meds  Medication Sig  . amLODipine (NORVASC) 5 MG tablet Take 1 tablet (5 mg total) by mouth daily.  Marland Kitchen aspirin 81 MG EC tablet Take 81 mg by mouth daily.   Marland Kitchen atorvastatin (LIPITOR) 40 MG tablet Take 1 tablet (40 mg total) by mouth daily.  . cetirizine (ZYRTEC) 10 MG tablet Take 1 tablet (10 mg total) by mouth daily.  . Cyanocobalamin (VITAMIN B 12 PO) Take 1,000 mcg by mouth daily.  . furosemide (LASIX) 20 MG tablet TAKE 1 TABLET BY MOUTH EVERY DAY  . hydrocortisone 2.5 % cream APPLY TO AFFECTED AREA TWICE A DAY AS NEEDED FOR RASH  . lisinopril (ZESTRIL) 40 MG tablet Take 1 tablet (40 mg total) by mouth daily.  . meclizine (ANTIVERT) 25 MG tablet Take 25 mg by mouth 2 (two) times daily as needed.  . meloxicam (MOBIC) 15 MG tablet Take 1 tablet (15 mg total) by mouth daily as needed for pain.  . metoprolol succinate (TOPROL-XL) 50 MG 24 hr tablet  Take 50 mg by mouth daily. Take with or immediately following a meal.  . Multiple Vitamin (MULTIVITAMIN WITH MINERALS) TABS tablet Take 1 tablet by mouth daily.  Marland Kitchen omeprazole (PRILOSEC) 20 MG capsule TAKE 1 CAPSULE (20 MG TOTAL) BY MOUTH DAILY AS NEEDED (HEARTBURN).  Marland Kitchen sertraline (ZOLOFT) 100 MG tablet TAKE 1 TABLET BY MOUTH EVERY DAY    Allergies: Formaldehyde and Tape  Social History   Tobacco Use  . Smoking status: Former Smoker    Packs/day: 1.00    Years: 27.00    Pack years: 27.00    Types: Cigarettes    Quit date: 04/18/1988    Years since quitting: 31.8  . Smokeless tobacco: Former Systems developer    Types: Secondary school teacher  . Vaping Use: Never used  Substance Use Topics  . Alcohol use: Yes    Alcohol/week: 3.0 standard drinks    Types: 3 Standard drinks or equivalent per week    Comment: Occasional- Former Heavy ETOH Use  . Drug use: No  Family History  Problem Relation Age of Onset  . Lung cancer Father        smoker  . Other Mother   . Sudden death Son        due to "Blood clots"  . Bipolar disorder Son   . Heart disease Son   . Kidney disease Daughter        congenital one small kidney  . Prostate cancer Neg Hx   . Bladder Cancer Neg Hx     Review of Systems: Mr. Pense reports stable right calf swelling.  He is scheduled for reevaluation by a vein specialist next week.  Otherwise, a 12-system review of systems was performed and was negative except as noted in the HPI.  --------------------------------------------------------------------------------------------------  Physical Exam: BP 124/80 (BP Location: Left Arm, Patient Position: Sitting, Cuff Size: Normal)   Pulse 82   Ht 5' 8"  (1.727 m)   Wt 215 lb 8 oz (97.8 kg)   SpO2 98%   BMI 32.77 kg/m   General: NAD. HEENT: Nose appears erythematous with small amount of bloody drainage. Lungs: Clear to auscultation bilaterally. Heart: Regular rate and rhythm without murmurs. Extremities: 1+ right calf edema,  which is chronic.  EKG: Normal sinus rhythm with PACs, bifascicular block, and LVH.  No significant change from prior tracing on 01/29/2020.  Lab Results  Component Value Date   WBC 6.0 06/10/2019   HGB 11.5 (L) 06/10/2019   HCT 36.1 (L) 06/10/2019   MCV 91.4 06/10/2019   PLT 213 06/10/2019    Lab Results  Component Value Date   NA 144 08/15/2019   K 4.1 08/15/2019   CL 106 08/15/2019   CO2 22 08/15/2019   BUN 17 08/15/2019   CREATININE 1.27 08/15/2019   GLUCOSE 93 08/15/2019   ALT 15 06/08/2019    Lab Results  Component Value Date   CHOL 156 08/15/2019   HDL 46 08/15/2019   LDLCALC 85 08/15/2019   TRIG 144 08/15/2019   CHOLHDL 4.4 06/09/2019    --------------------------------------------------------------------------------------------------  ASSESSMENT AND PLAN: Hypertension: Blood pressure readings at home have been quite elevated.  Interestingly, blood pressure in the office (with our sphygmomanometer and the patient's automated blood pressure cuff) are both normal.  I wonder if stress and pain from his recent skin procedure were contributing to elevated blood pressure readings at home.  We have agreed to defer medication changes today.  I have asked Mr. Schoening to continue to monitor his blood pressure periodically at home and to minimize his sodium intake.  Fatigue: This has been a chronic problem and did not improve with bisoprolol.  We discussed role for repeat ischemia evaluation, and [place] is symptoms are a manifestation of coronary insufficiency.  Stress test in 2017 was low risk.  Mr. Greenup would like to defer this for now, though we should readdress repeat ischemia evaluation at follow-up.  PAF: No evidence of recurrence, with only one documented episode occurring greater than 10 years ago.  We will continue to defer anticoagulation in the setting of subarachnoid hemorrhage in the past.  Right lower extremity edema: This has been a chronic issue and was  precipitated by a muscle tear many years ago.  Continue ongoing evaluation by vascular surgery.  Follow-up: Return to clinic in 3 months. [name], MD 02/14/2020 11:52 AM

## 2020-02-14 ENCOUNTER — Encounter: Payer: Self-pay | Admitting: Internal Medicine

## 2020-02-14 ENCOUNTER — Encounter: Payer: Self-pay | Admitting: Family Medicine

## 2020-02-14 DIAGNOSIS — R600 Localized edema: Secondary | ICD-10-CM | POA: Insufficient documentation

## 2020-02-14 DIAGNOSIS — R5383 Other fatigue: Secondary | ICD-10-CM | POA: Insufficient documentation

## 2020-02-14 DIAGNOSIS — R5381 Other malaise: Secondary | ICD-10-CM | POA: Insufficient documentation

## 2020-03-02 ENCOUNTER — Ambulatory Visit (INDEPENDENT_AMBULATORY_CARE_PROVIDER_SITE_OTHER): Payer: Medicare Other

## 2020-03-02 ENCOUNTER — Other Ambulatory Visit: Payer: Self-pay

## 2020-03-02 ENCOUNTER — Ambulatory Visit (INDEPENDENT_AMBULATORY_CARE_PROVIDER_SITE_OTHER): Payer: Medicare Other | Admitting: Vascular Surgery

## 2020-03-02 ENCOUNTER — Encounter (INDEPENDENT_AMBULATORY_CARE_PROVIDER_SITE_OTHER): Payer: Self-pay | Admitting: Vascular Surgery

## 2020-03-02 VITALS — BP 124/71 | HR 78 | Resp 16 | Wt 216.0 lb

## 2020-03-02 DIAGNOSIS — I872 Venous insufficiency (chronic) (peripheral): Secondary | ICD-10-CM

## 2020-03-02 DIAGNOSIS — R600 Localized edema: Secondary | ICD-10-CM

## 2020-03-02 DIAGNOSIS — I639 Cerebral infarction, unspecified: Secondary | ICD-10-CM

## 2020-03-02 DIAGNOSIS — I480 Paroxysmal atrial fibrillation: Secondary | ICD-10-CM

## 2020-03-02 DIAGNOSIS — E785 Hyperlipidemia, unspecified: Secondary | ICD-10-CM | POA: Diagnosis not present

## 2020-03-02 DIAGNOSIS — I10 Essential (primary) hypertension: Secondary | ICD-10-CM | POA: Diagnosis not present

## 2020-03-02 DIAGNOSIS — M7989 Other specified soft tissue disorders: Secondary | ICD-10-CM

## 2020-03-02 DIAGNOSIS — I890 Lymphedema, not elsewhere classified: Secondary | ICD-10-CM

## 2020-03-03 ENCOUNTER — Encounter (INDEPENDENT_AMBULATORY_CARE_PROVIDER_SITE_OTHER): Payer: Self-pay | Admitting: Vascular Surgery

## 2020-03-03 DIAGNOSIS — I872 Venous insufficiency (chronic) (peripheral): Secondary | ICD-10-CM | POA: Insufficient documentation

## 2020-03-03 DIAGNOSIS — I890 Lymphedema, not elsewhere classified: Secondary | ICD-10-CM | POA: Insufficient documentation

## 2020-03-03 NOTE — Progress Notes (Signed)
MRN : [id number]  Cameron Foster. is a 79 y.o. (December 07, 1940) male who presents with chief complaint of  Chief Complaint  Patient presents with  . Follow-up    ultrasound follow up  .  History of Present Illness:   The patient returns to the office for followup evaluation regarding leg swelling.  The swelling has persisted and the pain associated with swelling continues. There have not been any interval development of a ulcerations or wounds.  Since the previous visit the patient has been wearing graduated compression stockings and has noted little if any improvement in the lymphedema. The patient has been using compression routinely morning until night.  The patient also states elevation during the day and exercise is being done too.  Duplex ultrasound of the venous system right lower extremity demonstrates a widely patent deep venous system no evidence of deep or superficial venous reflux with the exception of one small segment of the saphenous vein at the level of the proximal calf.  Current Meds  Medication Sig  . amLODipine (NORVASC) 5 MG tablet Take 1 tablet (5 mg total) by mouth daily.  Marland Kitchen aspirin 81 MG EC tablet Take 81 mg by mouth daily.   . cetirizine (ZYRTEC) 10 MG tablet Take 1 tablet (10 mg total) by mouth daily.  . Cyanocobalamin (VITAMIN B 12 PO) Take 1,000 mcg by mouth daily.  . furosemide (LASIX) 20 MG tablet TAKE 1 TABLET BY MOUTH EVERY DAY  . hydrocortisone 2.5 % cream APPLY TO AFFECTED AREA TWICE A DAY AS NEEDED FOR RASH  . lisinopril (ZESTRIL) 40 MG tablet Take 1 tablet (40 mg total) by mouth daily.  . meclizine (ANTIVERT) 25 MG tablet Take 25 mg by mouth 2 (two) times daily as needed.  . meloxicam (MOBIC) 15 MG tablet Take 1 tablet (15 mg total) by mouth daily as needed for pain.  . metoprolol succinate (TOPROL-XL) 50 MG 24 hr tablet Take 50 mg by mouth daily. Take with or immediately following a meal.  . Multiple Vitamin (MULTIVITAMIN WITH MINERALS) TABS  tablet Take 1 tablet by mouth daily.  Marland Kitchen omeprazole (PRILOSEC) 20 MG capsule TAKE 1 CAPSULE (20 MG TOTAL) BY MOUTH DAILY AS NEEDED (HEARTBURN).  Marland Kitchen sertraline (ZOLOFT) 100 MG tablet TAKE 1 TABLET BY MOUTH EVERY DAY    Past Medical History:  Diagnosis Date  . Alcoholism ([place])   . Arthritis   . Atrial fibrillation ([place])    one episode  . Basal cell carcinoma 04/26/2017   Right medial cheek. Superficial and nodular  . Basal cell carcinoma 06/11/2019   Left anterior shoulder. Nodular pattern  . Basal cell carcinoma 09/09/2019   Right nasal ala, [facility]  . Basal cell carcinoma 02/05/2020   L upper eyebrow, [facility]   . Colon polyp   . Depression    Son died 07/05/2014  . Diverticulitis   . Diverticulosis 30 years  . Dysrhythmia    At Fib 1995  . GERD (gastroesophageal reflux disease)   . Hyperlipidemia   . Hypertension   . Irritable bowel syndrome   . Prostate cancer ([place]) July 05, 2010   treated with radiation therapy. Prostate  . Squamous cell carcinoma of skin 07/18/2017   Left medial calf. KA type  . Squamous cell carcinoma of skin 04/24/2018   Right above med. brow  . Squamous cell carcinoma of skin 06/11/2019   Right posterior shoulder. SCCis, hypertrophic  . Squamous cell carcinoma of skin 01/09/2020   Mid nasal dorsum, MOHS  .  Vasovagal syncope     Past Surgical History:  Procedure Laterality Date  . [place] no stents  . CATARACT EXTRACTION, BILATERAL    . COLON RESECTION SIGMOID N/A 12/07/2016   Procedure: COLON RESECTION SIGMOID;  Surgeon: Clayburn Pert, MD;  Location: [facility] ORS;  Service: General;  Laterality: N/A;  . COLON SURGERY  11/2016   Colostomy  . COLONOSCOPY  2016  . COLONOSCOPY WITH PROPOFOL N/A 05/30/2016   Procedure: COLONOSCOPY WITH PROPOFOL;  Surgeon: Lollie Sails, MD;  Location: [facility] ENDOSCOPY;  Service: Endoscopy;  Laterality: N/A;  . COLOSTOMY Left 12/07/2016   Procedure: COLOSTOMY;  Surgeon: Clayburn Pert, MD;   Location: [facility] ORS;  Service: General;  Laterality: Left;  . COLOSTOMY REVERSAL N/A 03/21/2017   Procedure: COLOSTOMY REVERSAL;  Surgeon: Clayburn Pert, MD;  Location: [facility] ORS;  Service: General;  Laterality: N/A;  . COLOSTOMY TAKEDOWN N/A 03/21/2017   Procedure: LAPAROSCOPIC COLOSTOMY TAKEDOWN;  Surgeon: Clayburn Pert, MD;  Location: [facility] ORS;  Service: General;  Laterality: N/A;  . CYSTOSCOPY WITH STENT PLACEMENT Bilateral 03/21/2017   Procedure: CYSTOSCOPY WITH LIGHTED STENT PLACEMENT;  Surgeon: Abbie Sons, MD;  Location: [facility] ORS;  Service: Urology;  Laterality: Bilateral;  . ESOPHAGOGASTRODUODENOSCOPY    . ESOPHAGOGASTRODUODENOSCOPY N/A 05/30/2016   Procedure: ESOPHAGOGASTRODUODENOSCOPY (EGD);  Surgeon: Lollie Sails, MD;  Location: [facility] ENDOSCOPY;  Service: Endoscopy;  Laterality: N/A;  . EYE SURGERY     cataracts  . FLEXIBLE SIGMOIDOSCOPY N/A 03/21/2017   Procedure: FLEXIBLE SIGMOIDOSCOPY;  Surgeon: Clayburn Pert, MD;  Location: [facility] ORS;  Service: General;  Laterality: N/A;  . FRACTURE SURGERY Bilateral    right arm and left wrist  . INCISION AND DRAINAGE ABSCESS N/A 12/07/2016   Procedure: DRAINAGE  OF INTRA ABDOMINAL ABSCESS;  Surgeon: Clayburn Pert, MD;  Location: [facility] ORS;  Service: General;  Laterality: N/A;  . KNEE ARTHROSCOPY    . LAPAROTOMY N/A 12/07/2016   Procedure: EXPLORATORY LAPAROTOMY;  Surgeon: Clayburn Pert, MD;  Location: [facility] ORS;  Service: General;  Laterality: N/A;  . PROSTATE SURGERY     Microwave therapy  . TONSILLECTOMY    . TOTAL KNEE ARTHROPLASTY Right 07/27/2016   Procedure: TOTAL KNEE ARTHROPLASTY;  Surgeon: [name], MD;  Location: [facility] ORS;  Service: Orthopedics;  Laterality: Right;  Dr. Erlene Quan had to place Urinary catheter due to prostate cancer history.  Using flexible scope.    Social History Social History   Tobacco Use  . Smoking status: Former Smoker    Packs/day: 1.00    Years: 27.00    Pack years: 27.00    Types:  Cigarettes    Quit date: 04/18/1988    Years since quitting: 31.8  . Smokeless tobacco: Former Systems developer    Types: Secondary school teacher  . Vaping Use: Never used  Substance Use Topics  . Alcohol use: Yes    Alcohol/week: 3.0 standard drinks    Types: 3 Standard drinks or equivalent per week    Comment: Occasional- Former Heavy ETOH Use  . Drug use: No    Family History Family History  Problem Relation Age of Onset  . Lung cancer Father        smoker  . Other Mother   . Sudden death Son        due to "Blood clots"  . Bipolar disorder Son   . Heart disease Son   . Kidney disease Daughter        congenital one  small kidney  . Prostate cancer Neg Hx   . Bladder Cancer Neg Hx     Allergies  Allergen Reactions  . Formaldehyde Rash  . Tape Rash    Pt states he is not allergic to tape.     REVIEW OF SYSTEMS (Negative unless checked)  Constitutional: [] Weight loss  [] Fever  [] Chills Cardiac: [] Chest pain   [] Chest pressure   [] Palpitations   [] Shortness of breath when laying flat   [] Shortness of breath with exertion. Vascular:  [] Pain in legs with walking   [x] Pain in legs at rest  [] History of DVT   [] Phlebitis   [x] Swelling in legs   [x] Varicose veins   [] Non-healing ulcers Pulmonary:   [] Uses home oxygen   [] Productive cough   [] Hemoptysis   [] Wheeze  [] COPD   [] Asthma Neurologic:  [] Dizziness   [] Seizures   [] History of stroke   [] History of TIA  [] Aphasia   [] Vissual changes   [] Weakness or numbness in arm   [] Weakness or numbness in leg Musculoskeletal:   [] Joint swelling   [] Joint pain   [] Low back pain Hematologic:  [] Easy bruising  [] Easy bleeding   [] Hypercoagulable state   [] Anemic Gastrointestinal:  [] Diarrhea   [] Vomiting  [] Gastroesophageal reflux/heartburn   [] Difficulty swallowing. Genitourinary:  [] Chronic kidney disease   [] Difficult urination  [] Frequent urination   [] Blood in urine Skin:  [] Rashes   [] Ulcers  Psychological:  [] History of anxiety   []  History of  major depression.  Physical Examination  Vitals:   03/02/20 1556  BP: 124/71  Pulse: 78  Resp: 16  Weight: 216 lb (98 kg)   Body mass index is 32.84 kg/m. Gen: WD/WN, NAD Head: [place]/AT, No temporalis wasting.  Ear/Nose/Throat: Hearing grossly intact, nares w/o erythema or drainage Eyes: PER, EOMI, sclera nonicteric.  Neck: Supple, no large masses.   Pulmonary:  Good air movement, no audible wheezing bilaterally, no use of accessory muscles.  Cardiac: RRR, no JVD Vascular: Diffuse varicosities present bilaterally.  Moderate to severe venous stasis changes to the legs bilaterally.  3-4+ soft pitting edema right greater than left. Vessel Right Left  Radial Palpable Palpable  PT Palpable Palpable  DP Palpable Palpable  Gastrointestinal: Non-distended. No guarding/no peritoneal signs.  Musculoskeletal: M/S 5/5 throughout.  No deformity or atrophy.  Neurologic: CN 2-12 intact. Symmetrical.  Speech is fluent. Motor exam as listed above. Psychiatric: Judgment intact, Mood & affect appropriate for pt's clinical situation. Dermatologic: Venous rashes no ulcers noted.  No changes consistent with cellulitis. Lymph : Mild lichenification or skin changes of chronic lymphedema.  CBC Lab Results  Component Value Date   WBC 6.0 06/10/2019   HGB 11.5 (L) 06/10/2019   HCT 36.1 (L) 06/10/2019   MCV 91.4 06/10/2019   PLT 213 06/10/2019    BMET    Component Value Date/Time   NA 144 08/15/2019 0832   K 4.1 08/15/2019 0832   CL 106 08/15/2019 0832   CO2 22 08/15/2019 0832   GLUCOSE 93 08/15/2019 0832   GLUCOSE 101 (H) 06/10/2019 0335   BUN 17 08/15/2019 0832   CREATININE 1.27 08/15/2019 0832   CREATININE 1.40 (H) 08/12/2015 1632   CALCIUM 9.5 08/15/2019 0832   GFRNONAA 54 (L) 08/15/2019 0832   GFRAA 62 08/15/2019 0832   CrCl cannot be calculated (Patient's most recent lab result is older than the maximum 21 days allowed.).  COAG Lab Results  Component Value Date   INR 1.0  06/08/2019   INR 1.0 10/20/2018  INR 1.1 10/10/2018    Radiology VAS [place] LOWER EXTREMITY VENOUS REFLUX  Result Date: 03/02/2020  Lower Venous Reflux Study Indications: Pain, and Swelling.  Performing Technologist: Almira Coaster RVS  Examination Guidelines: A complete evaluation includes B-mode imaging, spectral Doppler, color Doppler, and power Doppler as needed of all accessible portions of each vessel. Bilateral testing is considered an integral part of a complete examination. Limited examinations for reoccurring indications may be performed as noted. The reflux portion of the exam is performed with the patient in reverse Trendelenburg. Significant venous reflux is defined as >500 ms in the superficial venous system, and >1 second in the deep venous system.  Venous Reflux Times +--------------+---------+------+-----------+------------+--------+ RIGHT         Reflux NoRefluxReflux TimeDiameter cmsComments                         Yes                                  +--------------+---------+------+-----------+------------+--------+ CFV           no                                             +--------------+---------+------+-----------+------------+--------+ FV prox       no                                             +--------------+---------+------+-----------+------------+--------+ FV mid        no                                             +--------------+---------+------+-----------+------------+--------+ FV dist       no                                             +--------------+---------+------+-----------+------------+--------+ Popliteal     no                                             +--------------+---------+------+-----------+------------+--------+ GSV at SFJ    no                            .49              +--------------+---------+------+-----------+------------+--------+ GSV prox thighno                            .42               +--------------+---------+------+-----------+------------+--------+ GSV mid thigh no                            .25              +--------------+---------+------+-----------+------------+--------+  GSV dist thighno                            .43              +--------------+---------+------+-----------+------------+--------+ GSV at knee   no                            .44              +--------------+---------+------+-----------+------------+--------+ GSV prox calf           yes    1137 ms      .36              +--------------+---------+------+-----------+------------+--------+   Summary: Right: - No evidence of deep vein thrombosis seen in the right lower extremity, from the common femoral through the popliteal veins.  - No evidence of superficial venous thrombosis in the right lower extremity.  - There is no evidence of venous reflux seen in the right lower extremity.  Left: - Venous reflux is noted in the left greater saphenous vein in the calf.  *See table(s) above for measurements and observations. Electronically signed by Hortencia Pilar MD on 03/02/2020 at 4:57:14 PM.    Final      Assessment/Plan 1. Lymphedema I have reviewed my discussion with the patient regarding swelling/lymphedema and why it  causes symptoms.  Patient will begin wearing graduated compression stockings class 1 (20-30 mmHg) on a daily basis a prescription was given. The patient will  beginning wearing the stockings first thing in the morning and removing them in the evening. The patient is instructed specifically not to sleep in the stockings.   In addition, behavioral modification will be initiated.  This will include frequent elevation, use of over the counter pain medications and exercise such as walking.  The patient has been using his recliner and will continue to do so.  The patient has also been walking and exercising.  I have reviewed systemic causes for chronic edema such as liver,  kidney and cardiac etiologies.  The patient denies problems with these organ systems.    Consideration for a lymph pump will also be made based upon the effectiveness of conservative therapy and I will see him back in 6 months to determine whether the pump will be in a needed addition to his treatment plan.  This would help to improve the edema control and prevent sequela such as ulcers and infections   Patient's duplex ultrasound of the venous system is negative for DVT or other deep venous obstruction.  Trivial superficial reflux is noted at the calf only no evidence of reflux at the saphenofemoral junction or proximal great saphenous vein.    The patient will follow-up with me 6 months.    2. Chronic venous insufficiency I have reviewed my discussion with the patient regarding swelling/lymphedema and why it  causes symptoms.  Patient will begin wearing graduated compression stockings class 1 (20-30 mmHg) on a daily basis a prescription was given. The patient will  beginning wearing the stockings first thing in the morning and removing them in the evening. The patient is instructed specifically not to sleep in the stockings.   In addition, behavioral modification will be initiated.  This will include frequent elevation, use of over the counter pain medications and exercise such as walking.  The patient has been using his recliner and will continue to do so.  The patient has also been walking and exercising.  I have reviewed systemic causes for chronic edema such as liver, kidney and cardiac etiologies.  The patient denies problems with these organ systems.    Consideration for a lymph pump will also be made based upon the effectiveness of conservative therapy and I will see him back in 6 months to determine whether the pump will be in a needed addition to his treatment plan.  This would help to improve the edema control and prevent sequela such as ulcers and infections   Patient's duplex  ultrasound of the venous system is negative for DVT or other deep venous obstruction.  Trivial superficial reflux is noted at the calf only no evidence of reflux at the saphenofemoral junction or proximal great saphenous vein.    The patient will follow-up with me 6 months.    3. Paroxysmal atrial fibrillation (HCC) Continue antiarrhythmia medications as already ordered, these medications have been reviewed and there are no changes at this time.  Continue anticoagulation as ordered by Cardiology Service   4. Essential hypertension Continue antihypertensive medications as already ordered, these medications have been reviewed and there are no changes at this time.   5. Hyperlipidemia LDL goal <70 Continue statin as ordered and reviewed, no changes at this time     Hortencia Pilar, MD  03/03/2020 8:39 AM

## 2020-03-14 ENCOUNTER — Other Ambulatory Visit: Payer: Self-pay

## 2020-03-14 ENCOUNTER — Emergency Department: Payer: Medicare Other

## 2020-03-14 ENCOUNTER — Emergency Department
Admission: EM | Admit: 2020-03-14 | Discharge: 2020-03-14 | Disposition: A | Discharge status: Home / Self Care | Payer: Medicare Other | Attending: Emergency Medicine | Admitting: Emergency Medicine

## 2020-03-14 ENCOUNTER — Encounter: Payer: Self-pay | Admitting: Emergency Medicine

## 2020-03-14 DIAGNOSIS — I10 Essential (primary) hypertension: Secondary | ICD-10-CM | POA: Diagnosis not present

## 2020-03-14 DIAGNOSIS — T1490XA Injury, unspecified, initial encounter: Secondary | ICD-10-CM

## 2020-03-14 DIAGNOSIS — R109 Unspecified abdominal pain: Secondary | ICD-10-CM | POA: Insufficient documentation

## 2020-03-14 DIAGNOSIS — Z96651 Presence of right artificial knee joint: Secondary | ICD-10-CM | POA: Insufficient documentation

## 2020-03-14 DIAGNOSIS — Z87891 Personal history of nicotine dependence: Secondary | ICD-10-CM | POA: Insufficient documentation

## 2020-03-14 DIAGNOSIS — M7989 Other specified soft tissue disorders: Secondary | ICD-10-CM | POA: Diagnosis not present

## 2020-03-14 DIAGNOSIS — W010XXA Fall on same level from slipping, tripping and stumbling without subsequent striking against object, initial encounter: Secondary | ICD-10-CM | POA: Insufficient documentation

## 2020-03-14 DIAGNOSIS — Z7982 Long term (current) use of aspirin: Secondary | ICD-10-CM | POA: Insufficient documentation

## 2020-03-14 DIAGNOSIS — S2231XA Fracture of one rib, right side, initial encounter for closed fracture: Secondary | ICD-10-CM | POA: Diagnosis not present

## 2020-03-14 DIAGNOSIS — Z79899 Other long term (current) drug therapy: Secondary | ICD-10-CM | POA: Diagnosis not present

## 2020-03-14 DIAGNOSIS — S93401A Sprain of unspecified ligament of right ankle, initial encounter: Secondary | ICD-10-CM | POA: Insufficient documentation

## 2020-03-14 DIAGNOSIS — M25571 Pain in right ankle and joints of right foot: Secondary | ICD-10-CM | POA: Diagnosis not present

## 2020-03-14 DIAGNOSIS — S3991XA Unspecified injury of abdomen, initial encounter: Secondary | ICD-10-CM | POA: Diagnosis not present

## 2020-03-14 DIAGNOSIS — S2232XA Fracture of one rib, left side, initial encounter for closed fracture: Secondary | ICD-10-CM | POA: Diagnosis not present

## 2020-03-14 DIAGNOSIS — Z85828 Personal history of other malignant neoplasm of skin: Secondary | ICD-10-CM | POA: Diagnosis not present

## 2020-03-14 DIAGNOSIS — M549 Dorsalgia, unspecified: Secondary | ICD-10-CM | POA: Diagnosis not present

## 2020-03-14 DIAGNOSIS — S299XXA Unspecified injury of thorax, initial encounter: Secondary | ICD-10-CM | POA: Diagnosis present

## 2020-03-14 DIAGNOSIS — M545 Low back pain, unspecified: Secondary | ICD-10-CM | POA: Diagnosis not present

## 2020-03-14 DIAGNOSIS — M546 Pain in thoracic spine: Secondary | ICD-10-CM | POA: Diagnosis not present

## 2020-03-14 DIAGNOSIS — Z8546 Personal history of malignant neoplasm of prostate: Secondary | ICD-10-CM | POA: Diagnosis not present

## 2020-03-14 DIAGNOSIS — R079 Chest pain, unspecified: Secondary | ICD-10-CM | POA: Diagnosis not present

## 2020-03-14 DIAGNOSIS — W19XXXA Unspecified fall, initial encounter: Secondary | ICD-10-CM

## 2020-03-14 LAB — BASIC METABOLIC PANEL
Anion gap: 10 (ref 5–15)
BUN: 14 mg/dL (ref 8–23)
CO2: 25 mmol/L (ref 22–32)
Calcium: 9.4 mg/dL (ref 8.9–10.3)
Chloride: 102 mmol/L (ref 98–111)
Creatinine, Ser: 1.4 mg/dL — ABNORMAL HIGH (ref 0.61–1.24)
GFR, Estimated: 51 mL/min — ABNORMAL LOW (ref >60)
Glucose, Bld: 118 mg/dL — ABNORMAL HIGH (ref 70–99)
Potassium: 3.6 mmol/L (ref 3.5–5.1)
Sodium: 137 mmol/L (ref 135–145)

## 2020-03-14 LAB — CBC
HCT: 36.3 % — ABNORMAL LOW (ref 39.0–52.0)
Hemoglobin: 11.7 g/dL — ABNORMAL LOW (ref 13.0–17.0)
MCH: 29 pg (ref 26.0–34.0)
MCHC: 32.2 g/dL (ref 30.0–36.0)
MCV: 90.1 fL (ref 80.0–100.0)
Platelets: 199 10*3/uL (ref 150–400)
RBC: 4.03 MIL/uL — ABNORMAL LOW (ref 4.22–5.81)
RDW: 13 % (ref 11.5–15.5)
WBC: 8.5 10*3/uL (ref 4.0–10.5)
nRBC: 0 % (ref 0.0–0.2)

## 2020-03-14 LAB — TROPONIN I (HIGH SENSITIVITY)
Troponin I (High Sensitivity): 10 ng/L (ref <18)
Troponin I (High Sensitivity): 11 ng/L (ref <18)

## 2020-03-14 IMAGING — CR DG CHEST 2V
1 series · 2 of 2 positions shown · non-contrast
Comparison: [DATE]

CLINICAL DATA: Chest pain.

EXAM:
CHEST - 2 VIEW

[Series 1: dg chest 2 view · 0.14mm/px · 2 of 2 slices shown]
[im 1/2]
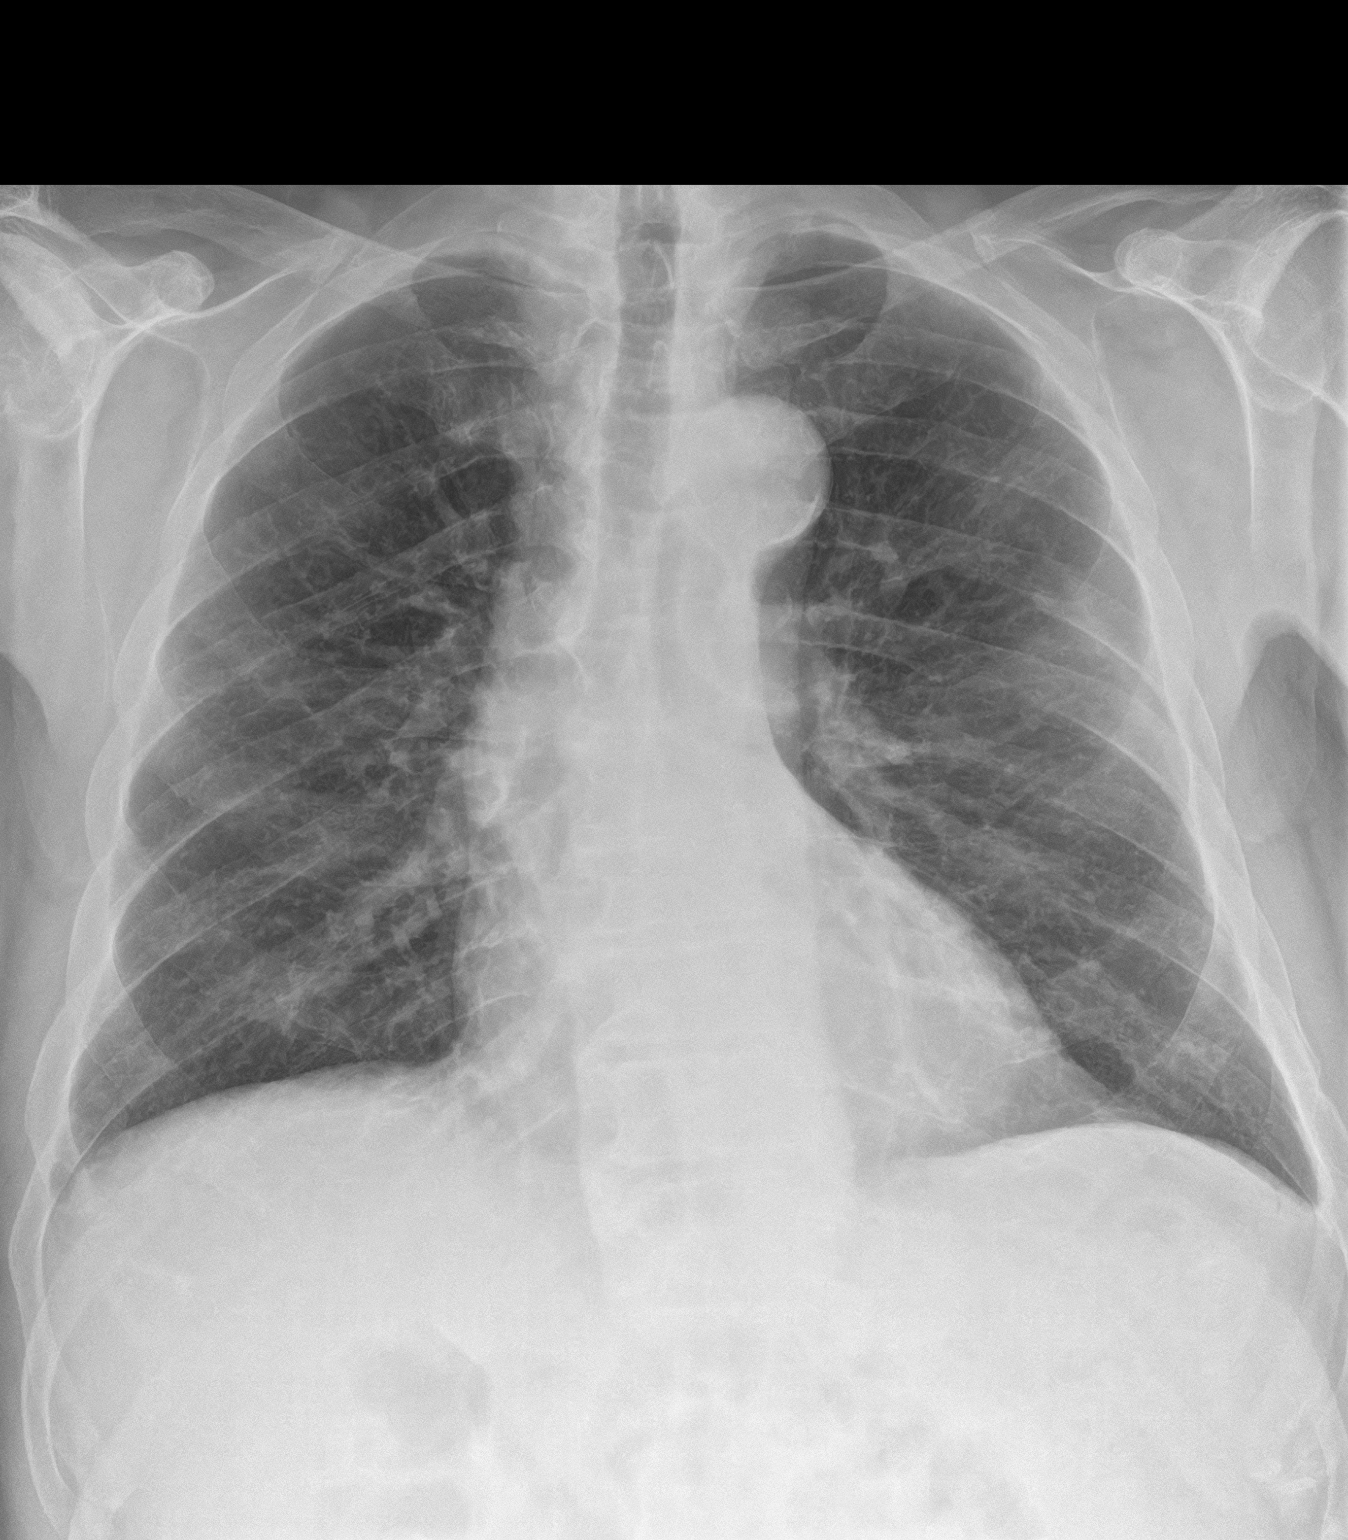
[im 2/2]
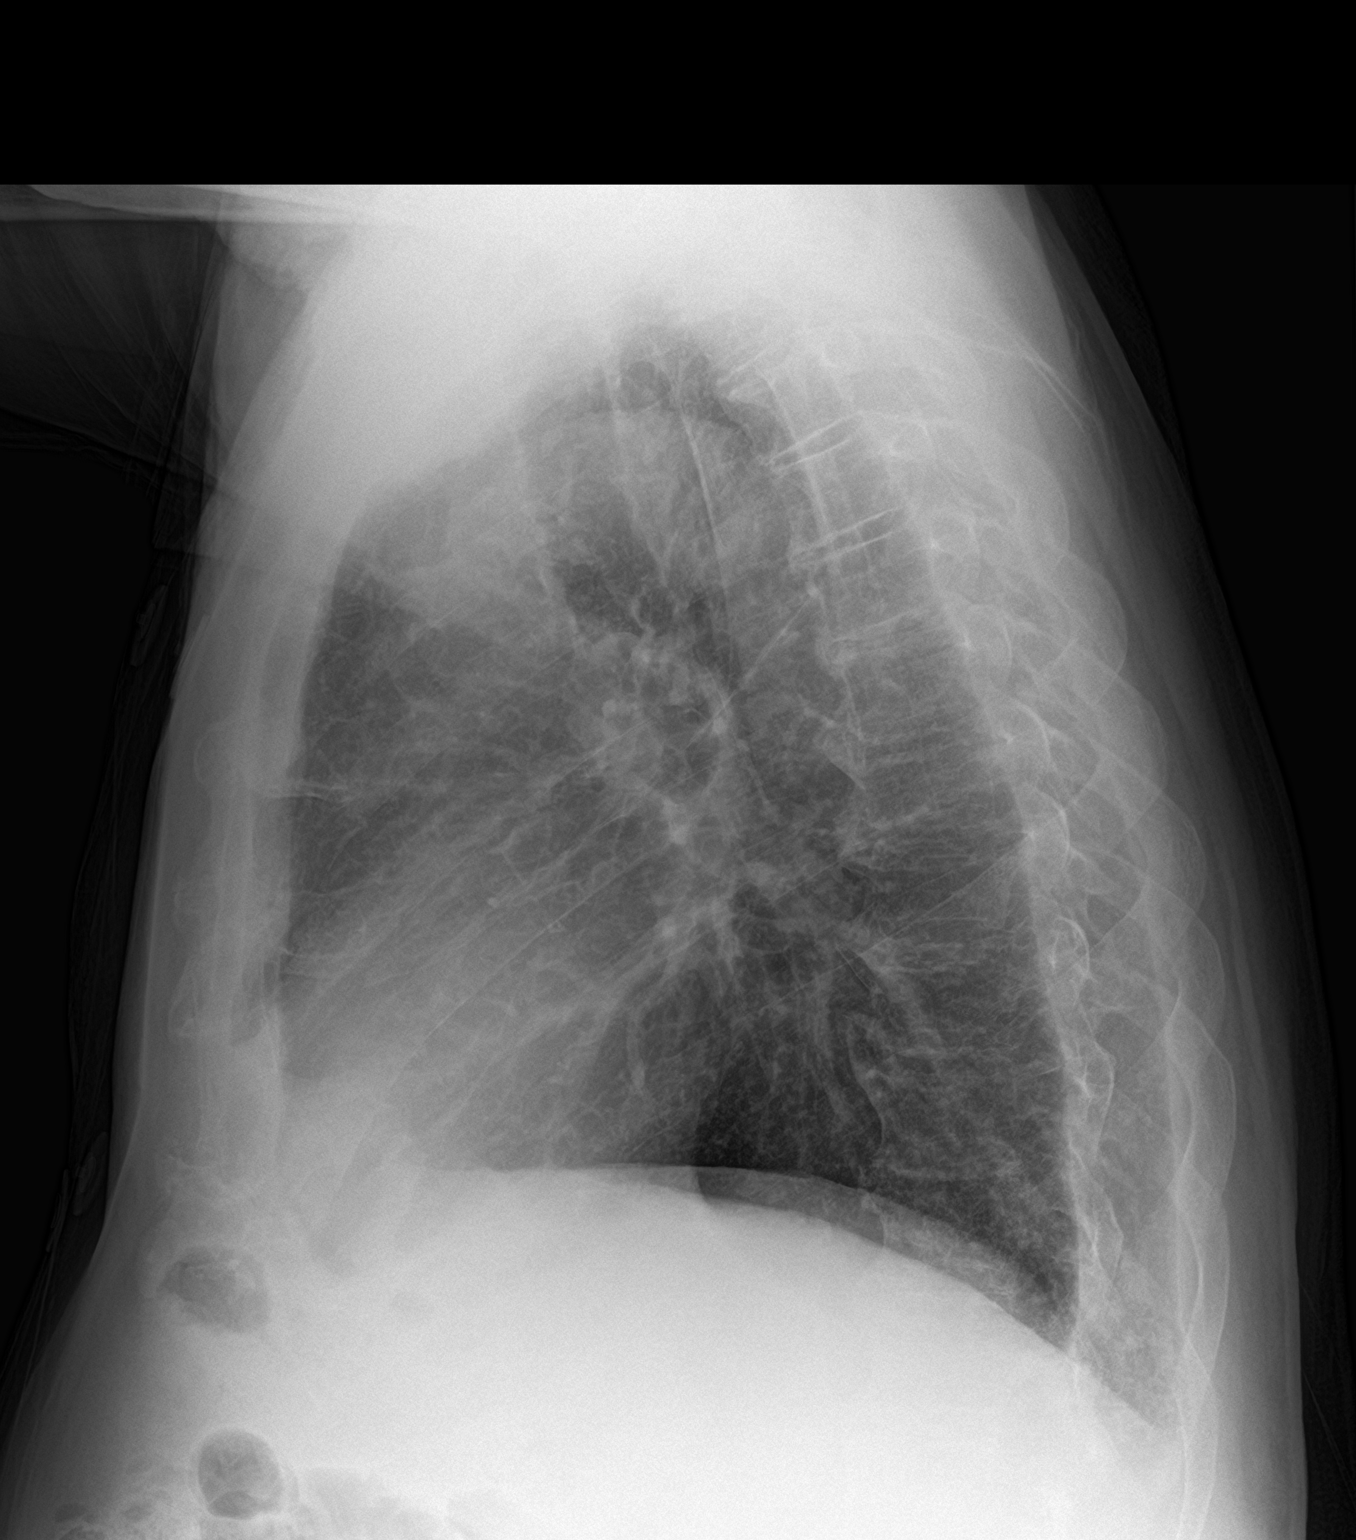

[2 of 2 positions shown; findings below may reference images not displayed]

FINDINGS: Cardiomediastinal silhouette is normal. Mediastinal contours appear
intact. Tortuosity and calcific atherosclerotic disease of the
aorta.

There is no evidence of focal airspace consolidation, pleural
effusion or pneumothorax.

Osseous structures are without acute abnormality. Soft tissues are
grossly normal.
IMPRESSION: 1. No active cardiopulmonary disease.
2. Tortuosity and calcific atherosclerotic disease of the aorta.

## 2020-03-14 IMAGING — CT CT L SPINE W/O CM
3 series · 15 of 33 positions shown, 16 images · IV contrast (omnipaque)
Comparison: Lumbar spine MRI [DATE]

CLINICAL DATA: Upper and lower back pain. Tripped and fell a few
days ago.

EXAM:
CT THORACIC AND LUMBAR SPINE WITH CONTRAST
TECHNIQUE: Multiplanar CT images of the thoracic and lumbar spine were
reconstructed from contemporary CT of the Chest, Abdomen, and Pelvis
CONTRAST:  100 mL Omnipaque 300 for concurrent CT of the chest,
abdomen, and pelvis

[Series 1: lspine soft · axial · 0.43mm/px · z∈[-548,-458]mm · 4 of 117 slices shown]
[im 9/117  soft-tissue]
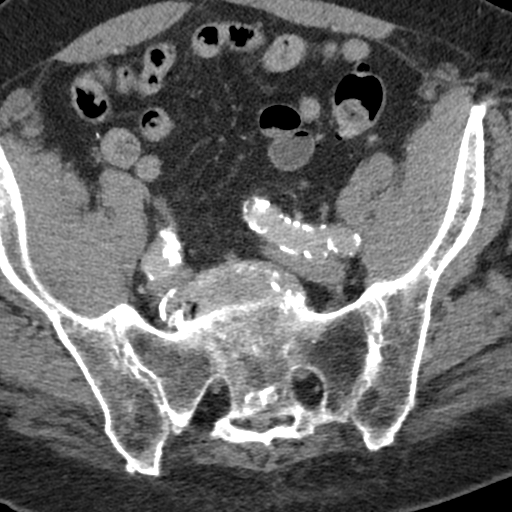
[im 27/117  soft-tissue]
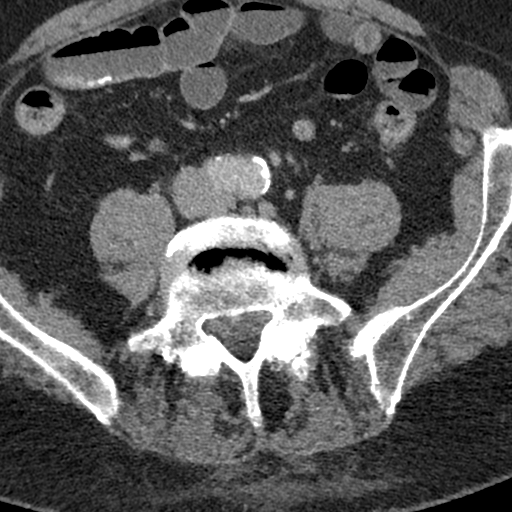
[im 36/117  soft-tissue]
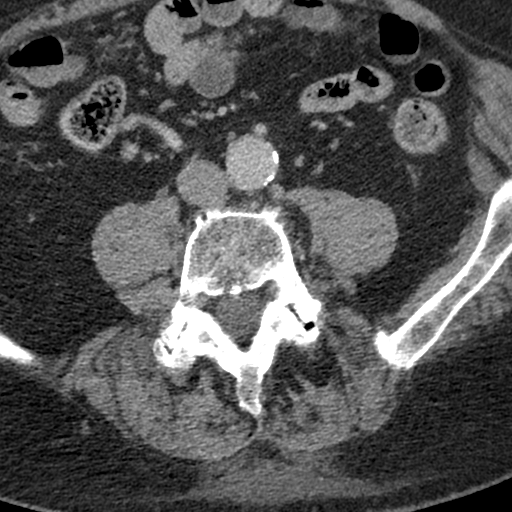
[im 54/117  soft-tissue]
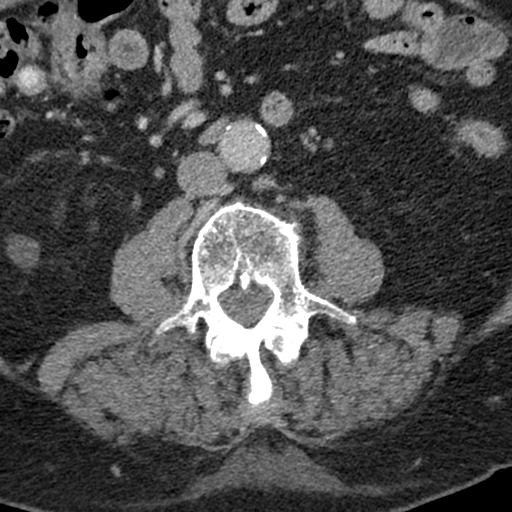

[Series 5: lspine bone sag · sagittal · 0.34mm/px · 5 of 114 slices shown, 6 images]
[im 38/114  bone]
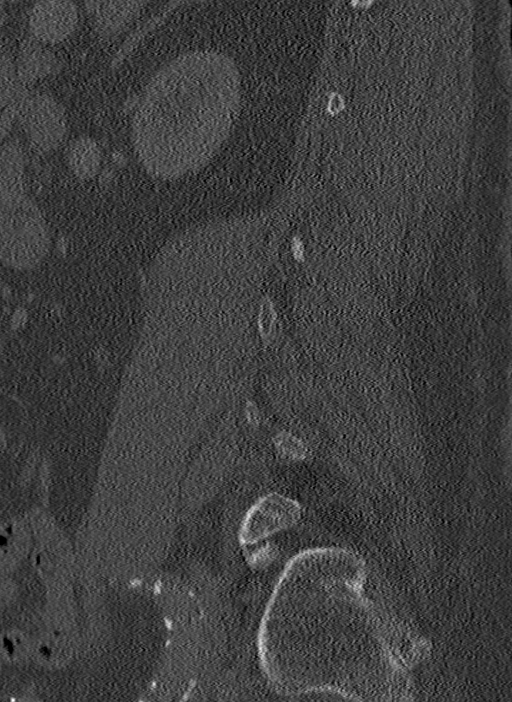
[im 48/114  bone]
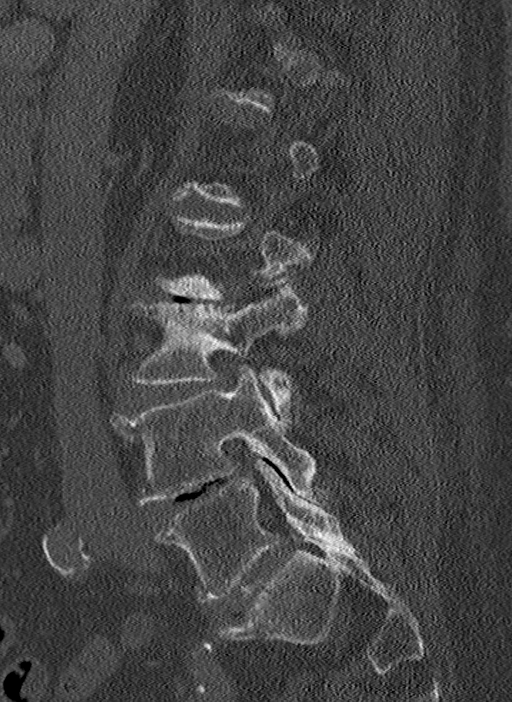
[im 57/114  soft-tissue]
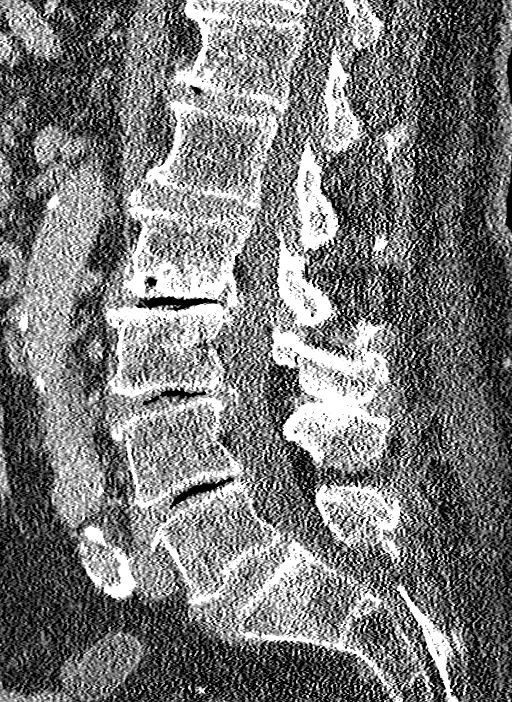
[im 57/114  bone]
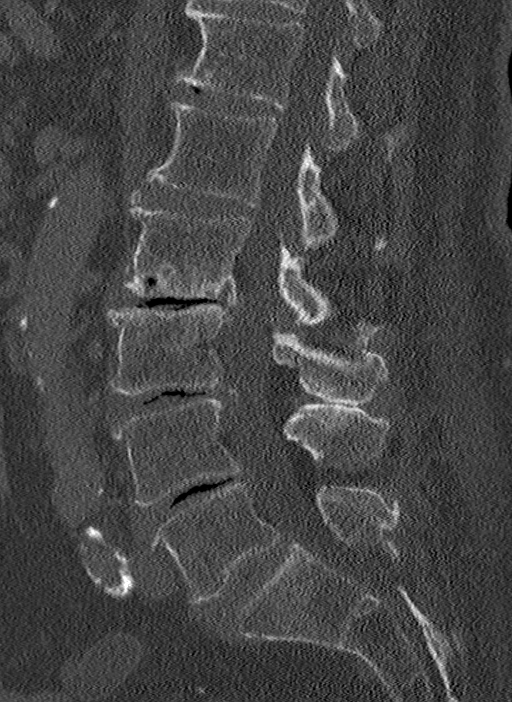
[im 66/114  bone]
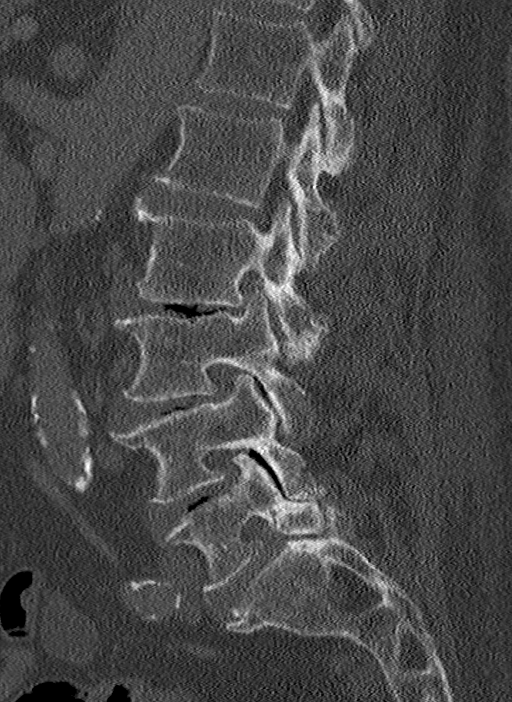
[im 76/114  bone]
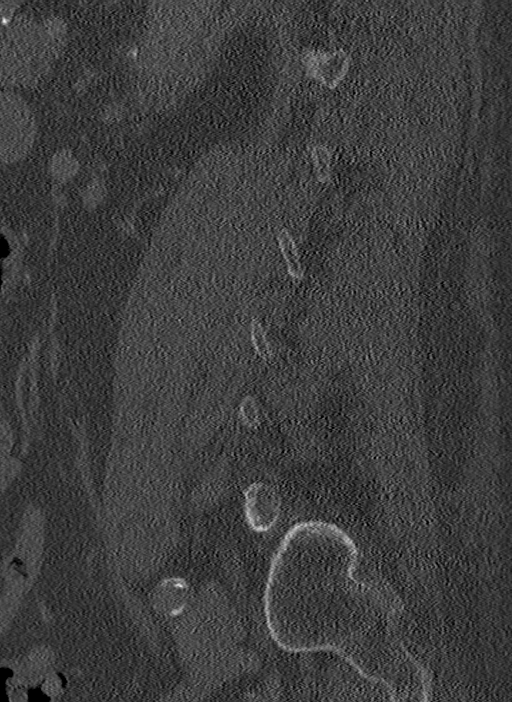

[Series 7: lspine bone · axial · 0.21mm/px · z∈[-488,-440]mm · 6 of 100 slices shown]
[im 39/100  bone]
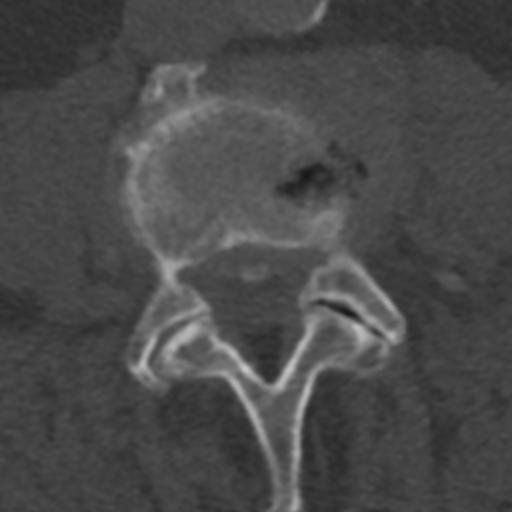
[im 42/100  bone]
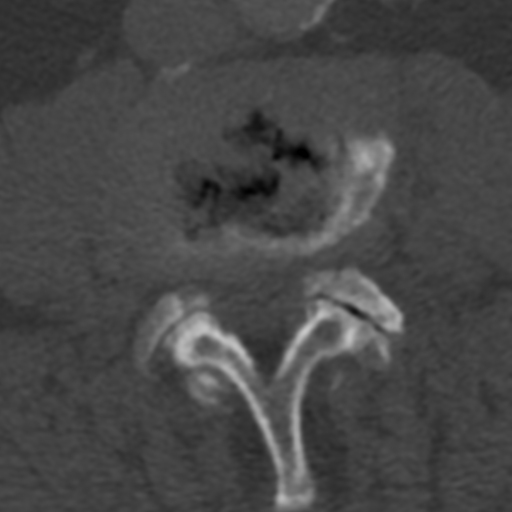
[im 45/100  bone]
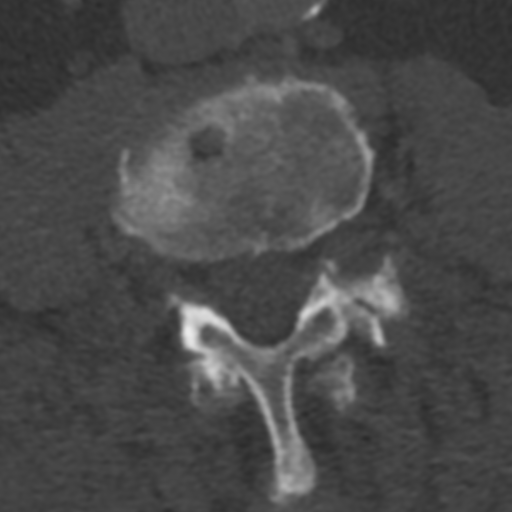
[im 49/100  bone]
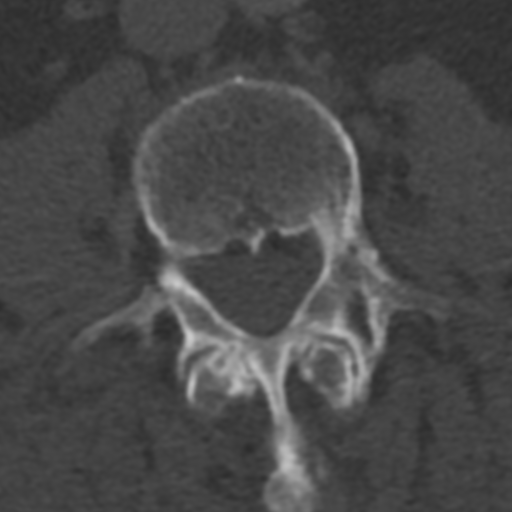
[im 52/100  bone]
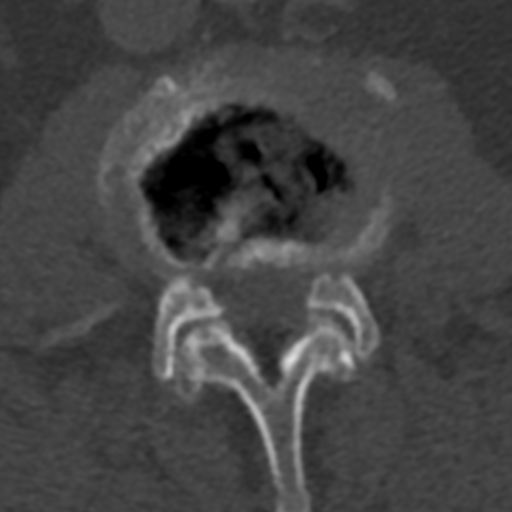
[im 56/100  bone]
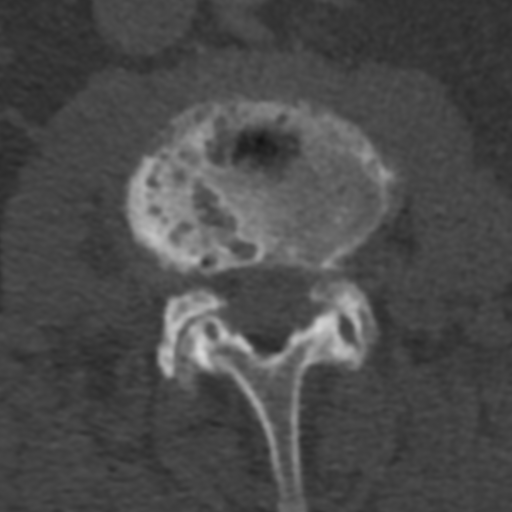

[15 of 33 positions shown; findings below may reference images not displayed]

FINDINGS: CT THORACIC SPINE FINDINGS

Alignment: Mild thoracic dextroscoliosis.  No listhesis.

Vertebrae: No acute fracture or suspicious osseous lesion.

Paraspinal and other soft tissues: No acute paraspinal soft tissue
finding. Intrathoracic contents reported separately.

Disc levels: Moderate spondylosis with anterior vertebral
osteophytes throughout the thoracic spine. Moderate left facet
arthrosis at T9-10. No evidence of compressive spinal or neural
foraminal stenosis.

CT LUMBAR SPINE FINDINGS

Segmentation: 5 lumbar vertebrae. Pseudoarticulation between the
right L5 transverse process and sacrum.

Alignment: Mild lumbar levoscoliosis. Unchanged trace
anterolisthesis of L5 on S1.

Vertebrae: No acute fracture or suspicious osseous lesion.

Paraspinal and other soft tissues: No acute paraspinal soft tissue
finding. Intra-abdominal and pelvic contents reported separately.

Disc levels: Unchanged disc space narrowing from L2-3 to L4-5,
severe at L2-3 where there are prominent degenerative endplate
changes. Vacuum disc from L2-3 to L4-5. Similar appearance of mild
to moderate spinal stenosis at L2-3 and L3-4 due to disc bulging and
posterior element hypertrophy as well as a superimposed central disc
protrusion at L3-4. Similar appearance of multilevel neural
foraminal stenosis, severe on the right at L2-3.
IMPRESSION: 1. No acute osseous abnormality identified in the thoracic or lumbar
spine.
2. Unchanged lumbar disc and facet degeneration with
mild-to-moderate multilevel spinal and up to severe neural foraminal
stenosis.

## 2020-03-14 IMAGING — CT CT T SPINE W/O CM
3 series · 10 of 33 positions shown, 11 images · IV contrast (omnipaque)
Comparison: Lumbar spine MRI [DATE]

CLINICAL DATA: Upper and lower back pain. Tripped and fell a few
days ago.

EXAM:
CT THORACIC AND LUMBAR SPINE WITH CONTRAST
TECHNIQUE: Multiplanar CT images of the thoracic and lumbar spine were
reconstructed from contemporary CT of the Chest, Abdomen, and Pelvis
CONTRAST:  100 mL Omnipaque 300 for concurrent CT of the chest,
abdomen, and pelvis

[Series 4: t spine soft · axial · 0.34mm/px · z∈[-302,-148]mm · 2 of 168 slices shown, 3 images]
[im 52/168  soft-tissue]
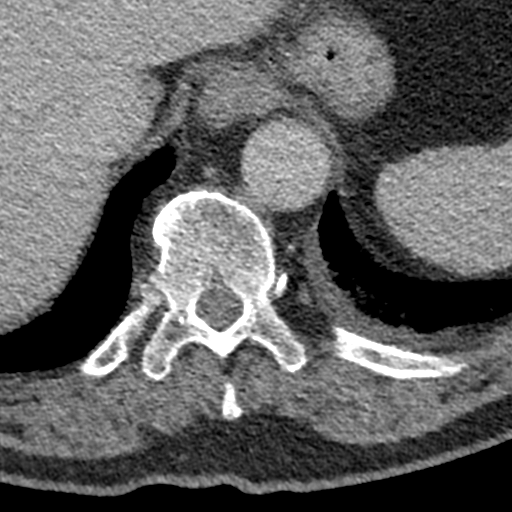
[im 52/168  bone]
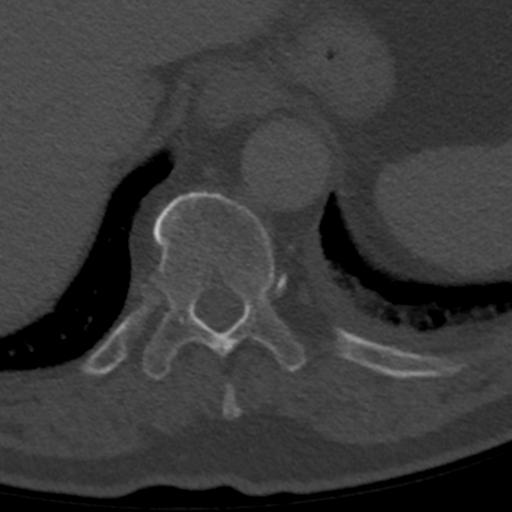
[im 129/168  bone]
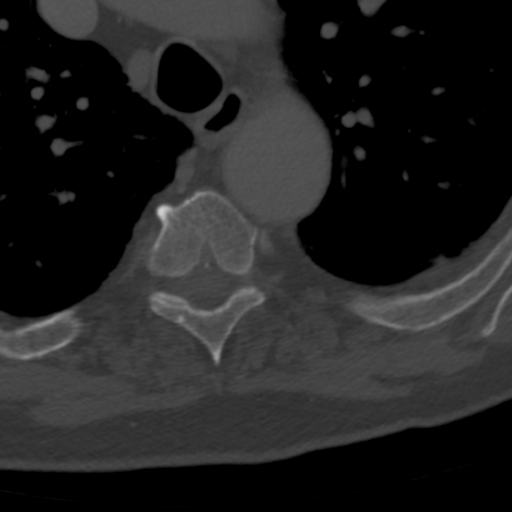

[Series 5: t spine bone sag · sagittal · 0.34mm/px · 5 of 83 slices shown]
[im 28/83  bone]
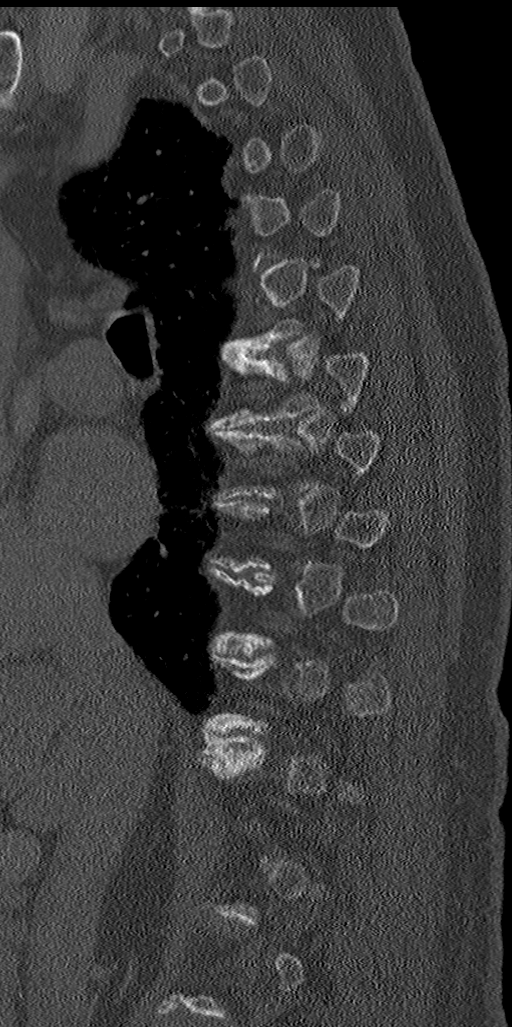
[im 35/83  bone]
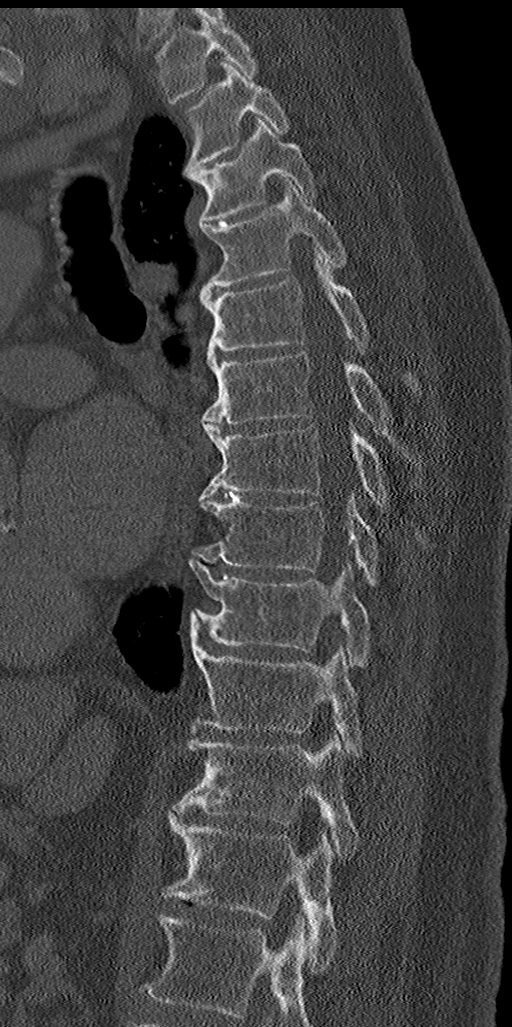
[im 42/83  bone]
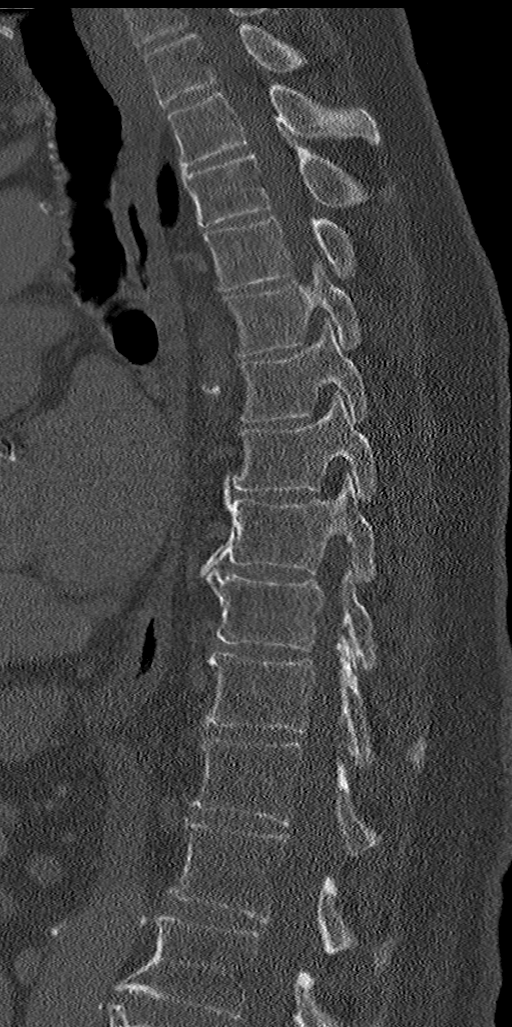
[im 48/83  bone]
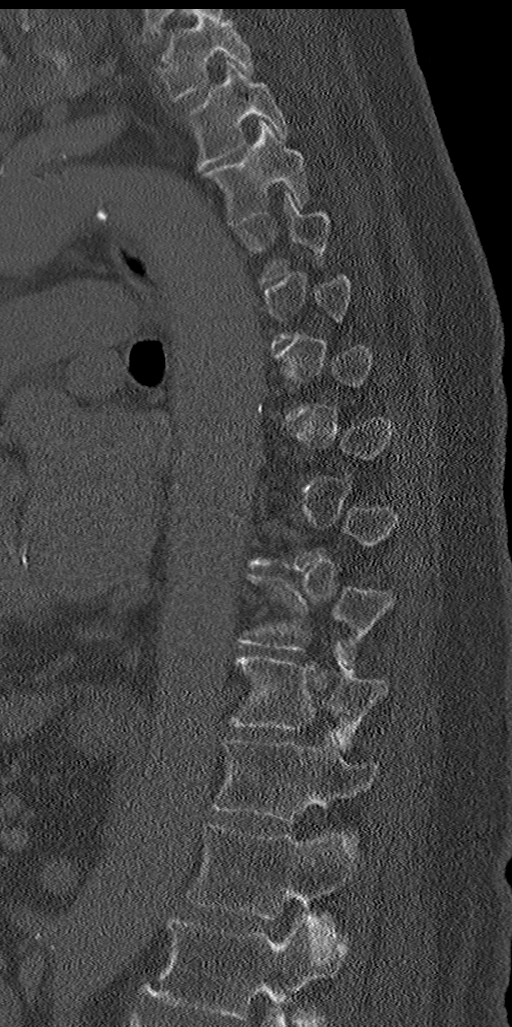
[im 55/83  bone]
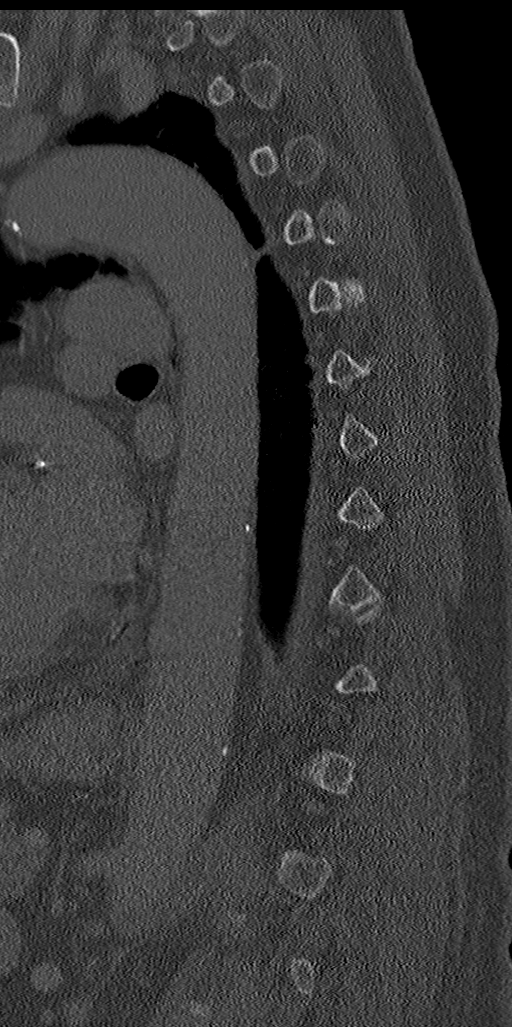

[Series 6: t spine bone cor · coronal · 0.32mm/px · 3 of 88 slices shown]
[im 18/88  bone]
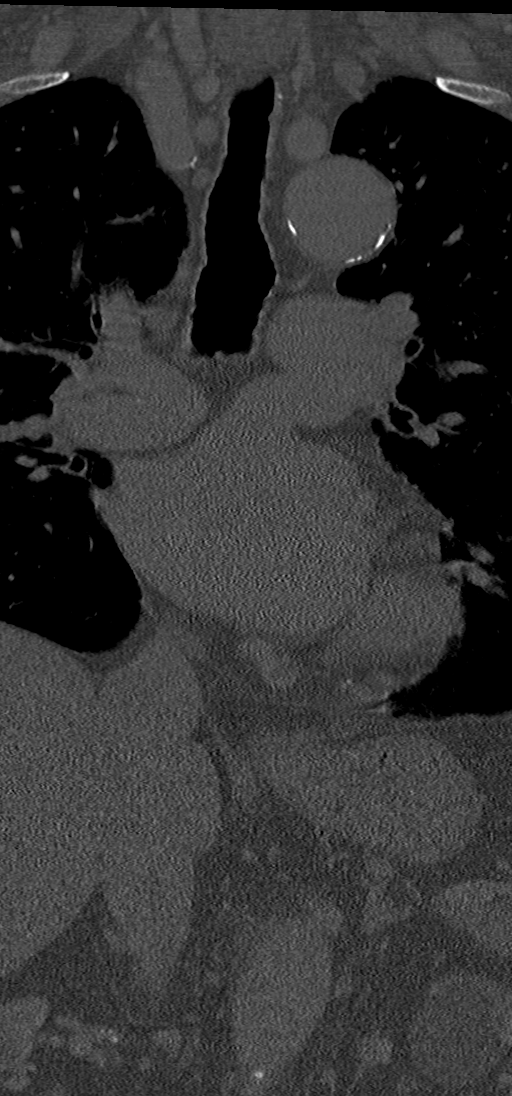
[im 35/88  bone]
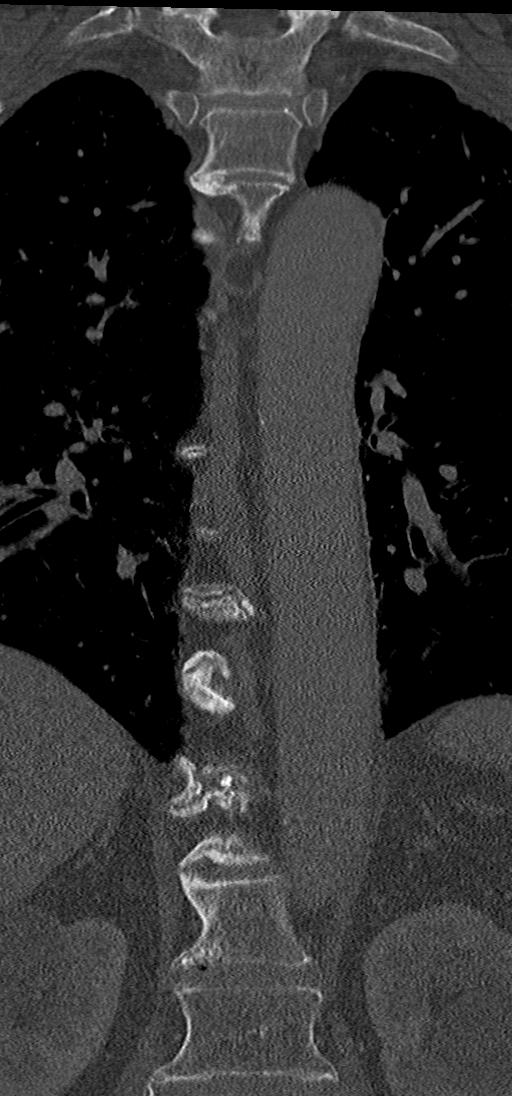
[im 53/88  bone]
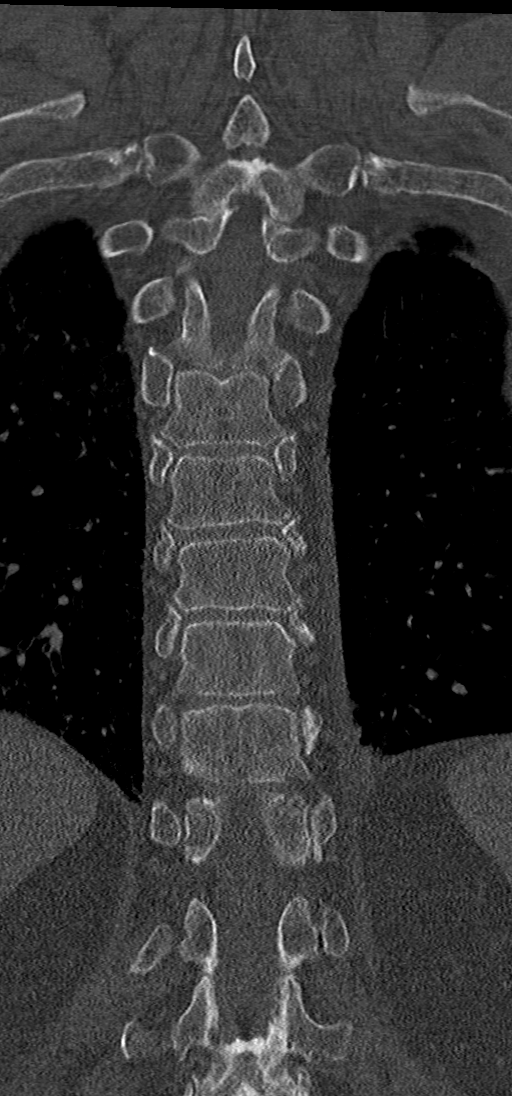

[10 of 33 positions shown; findings below may reference images not displayed]

FINDINGS: CT THORACIC SPINE FINDINGS

Alignment: Mild thoracic dextroscoliosis.  No listhesis.

Vertebrae: No acute fracture or suspicious osseous lesion.

Paraspinal and other soft tissues: No acute paraspinal soft tissue
finding. Intrathoracic contents reported separately.

Disc levels: Moderate spondylosis with anterior vertebral
osteophytes throughout the thoracic spine. Moderate left facet
arthrosis at T9-10. No evidence of compressive spinal or neural
foraminal stenosis.

CT LUMBAR SPINE FINDINGS

Segmentation: 5 lumbar vertebrae. Pseudoarticulation between the
right L5 transverse process and sacrum.

Alignment: Mild lumbar levoscoliosis. Unchanged trace
anterolisthesis of L5 on S1.

Vertebrae: No acute fracture or suspicious osseous lesion.

Paraspinal and other soft tissues: No acute paraspinal soft tissue
finding. Intra-abdominal and pelvic contents reported separately.

Disc levels: Unchanged disc space narrowing from L2-3 to L4-5,
severe at L2-3 where there are prominent degenerative endplate
changes. Vacuum disc from L2-3 to L4-5. Similar appearance of mild
to moderate spinal stenosis at L2-3 and L3-4 due to disc bulging and
posterior element hypertrophy as well as a superimposed central disc
protrusion at L3-4. Similar appearance of multilevel neural
foraminal stenosis, severe on the right at L2-3.
IMPRESSION: 1. No acute osseous abnormality identified in the thoracic or lumbar
spine.
2. Unchanged lumbar disc and facet degeneration with
mild-to-moderate multilevel spinal and up to severe neural foraminal
stenosis.

## 2020-03-14 IMAGING — CT CT CHEST-ABD-PELV W/ CM
2 of 5 series · 13 of 36 positions shown, 15 images · IV contrast (omnipaque)
Comparison: [DATE]

CLINICAL DATA: Status post fall.

EXAM:
CT CHEST, ABDOMEN, AND PELVIS WITH CONTRAST
TECHNIQUE: Multidetector CT imaging of the chest, abdomen and pelvis was
performed following the standard protocol during bolus
administration of intravenous contrast.
CONTRAST:  100mL OMNIPAQUE IOHEXOL 300 MG/ML  SOLN

[Series 2: cap with · axial · 0.95mm/px · z∈[-661,-126]mm · 10 of 131 slices shown, 12 images]
[im 12/131  mediastinal]
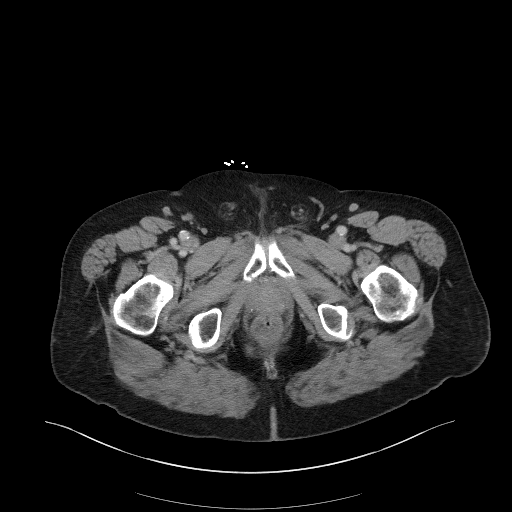
[im 12/131  bone]
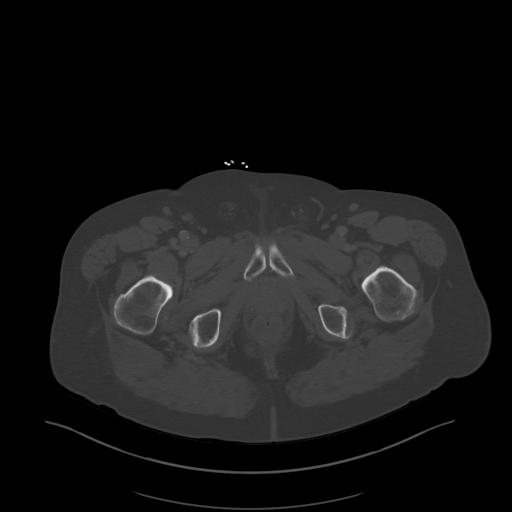
[im 24/131  mediastinal]
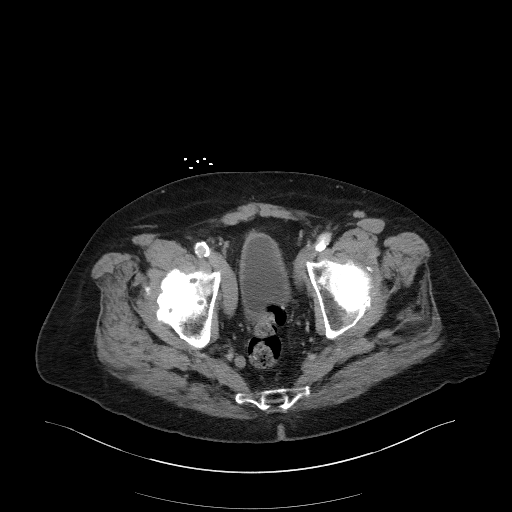
[im 36/131  mediastinal]
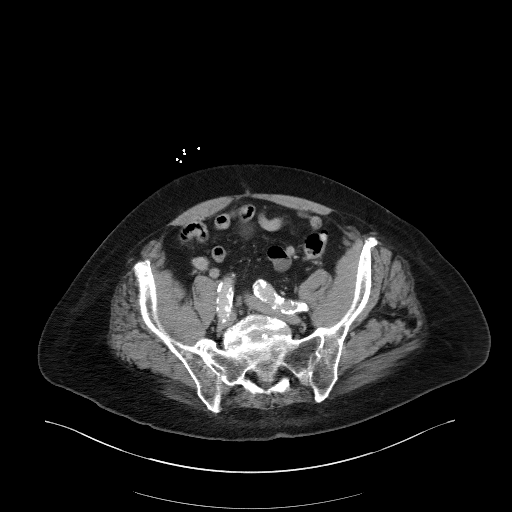
[im 48/131  mediastinal]
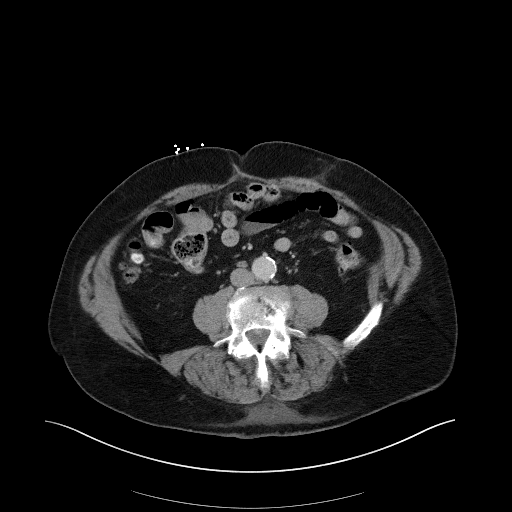
[im 60/131  mediastinal]
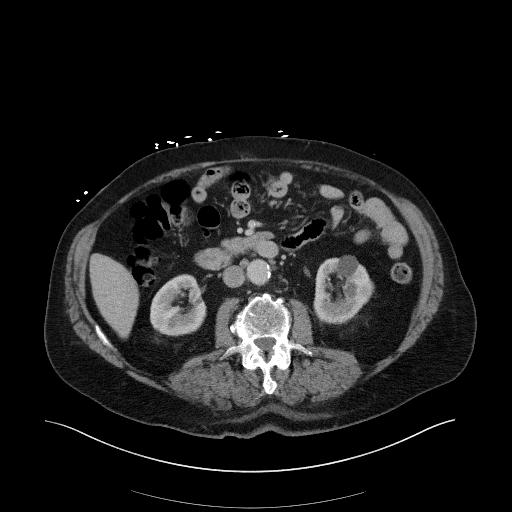
[im 71/131  mediastinal]
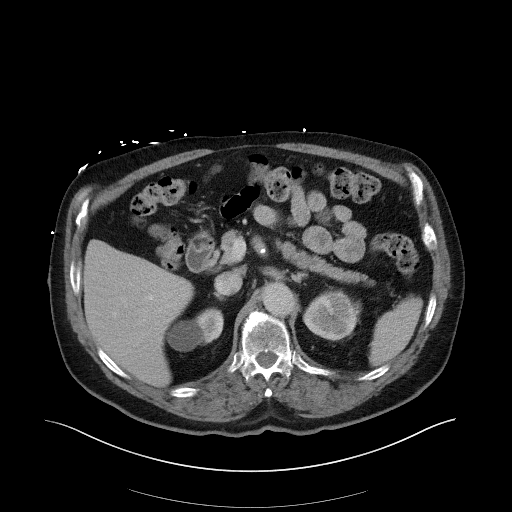
[im 83/131  mediastinal]
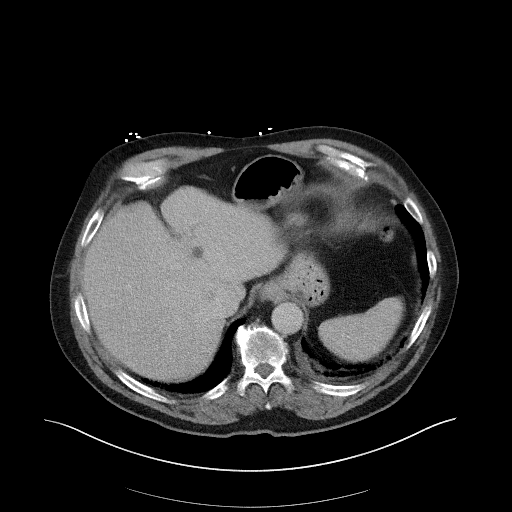
[im 95/131  mediastinal]
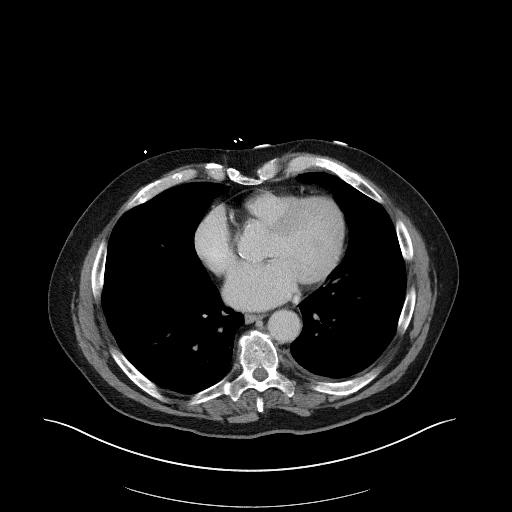
[im 107/131  mediastinal]
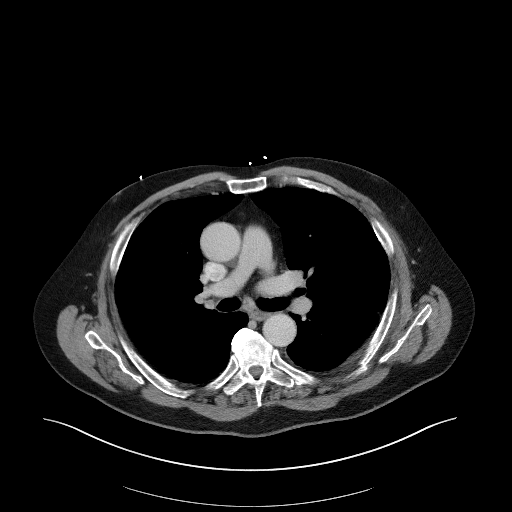
[im 107/131  bone]
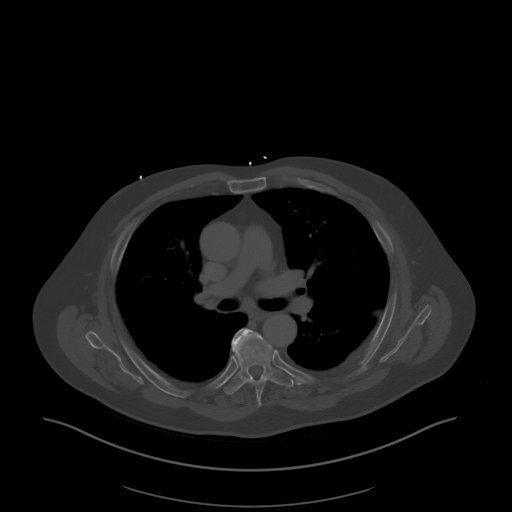
[im 119/131  mediastinal]
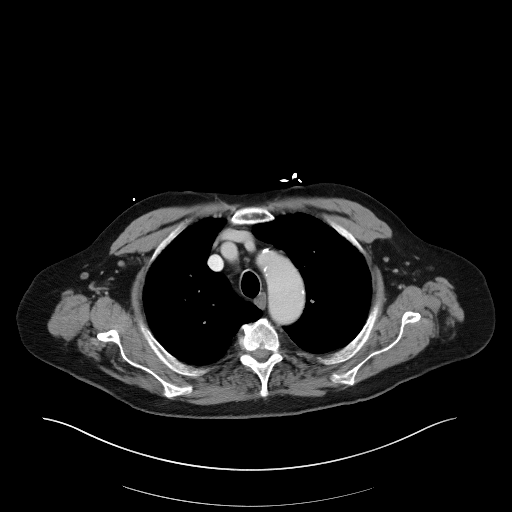

[Series 5: coronals · coronal · 0.80mm/px · 3 of 154 slices shown]
[im 31/154  mediastinal]
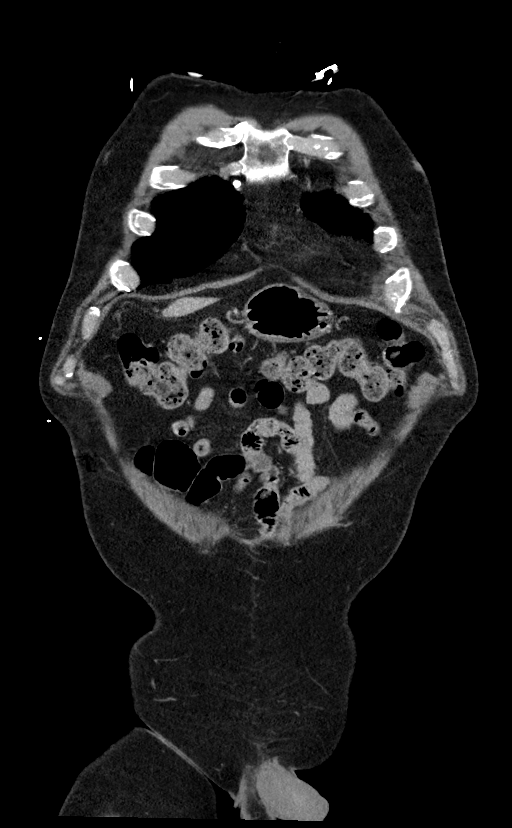
[im 62/154  mediastinal]
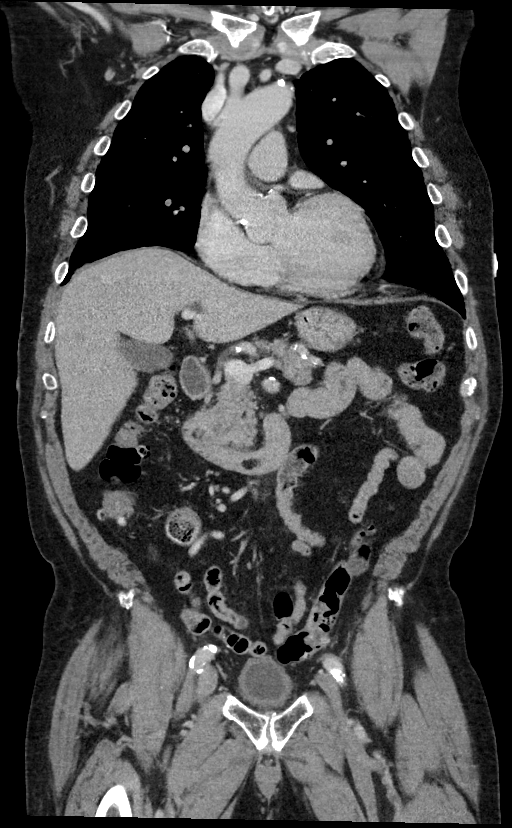
[im 92/154  mediastinal]
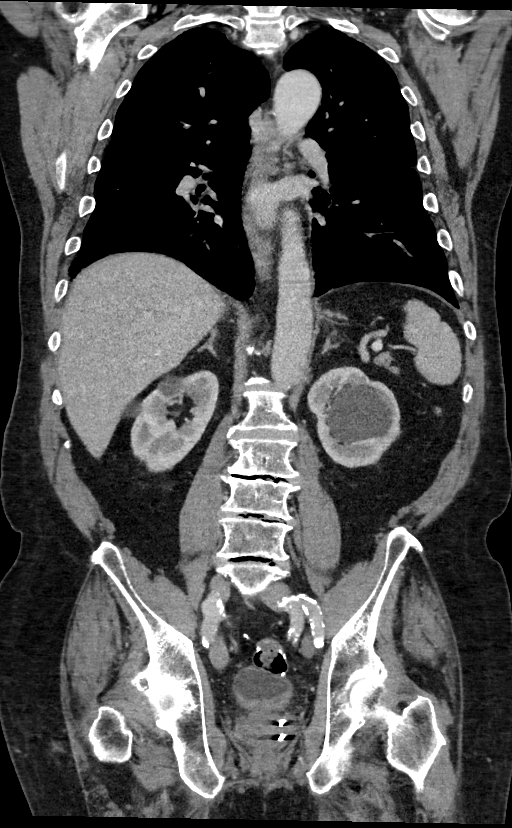

[13 of 36 positions shown; findings below may reference images not displayed]

FINDINGS: CT CHEST FINDINGS

Cardiovascular: There is moderate to marked severity calcification
of the aortic arch. Normal heart size with marked severity coronary
artery calcification. No pericardial effusion.

Mediastinum/Nodes: No enlarged mediastinal, hilar, or axillary lymph
nodes. Thyroid gland, trachea, and esophagus demonstrate no
significant findings.

Lungs/Pleura: Mild, stable linear scarring and/or atelectasis is
seen within the lateral aspect of the right lung base and anterior
aspect of the left lung base.

There is no evidence of acute infiltrate, pleural effusion or
pneumothorax.

Musculoskeletal: A posterior fifth left rib fracture is seen.
Multilevel degenerative changes seen throughout the thoracic spine.

CT ABDOMEN PELVIS FINDINGS

Hepatobiliary: A 1.0 cm diameter round focus of parenchymal low
attenuation is seen within the posterolateral aspect of the left
lobe of the liver (axial CT image 49, CT series number 2). No
gallstones, gallbladder wall thickening, or biliary dilatation.

Pancreas: Unremarkable. No pancreatic ductal dilatation or
surrounding inflammatory changes.

Spleen: Normal in size without focal abnormality.

Adrenals/Urinary Tract: Adrenal glands are unremarkable. Kidneys are
normal in size, without renal calculi or hydronephrosis. Multiple
stable bilateral simple cysts are seen. Bladder is unremarkable.

Stomach/Bowel: Stomach is within normal limits. Appendix appears
normal. No evidence of bowel wall thickening, distention, or
inflammatory changes. Noninflamed diverticula are seen throughout
the large bowel.

Vascular/Lymphatic: Aortic atherosclerosis. No enlarged abdominal or
pelvic lymph nodes.

Reproductive: Metallic density surgical clips are seen within a
mildly enlarged prostate gland.

Other: No abdominal wall hernia or abnormality. No abdominopelvic
ascites.

Musculoskeletal: Multilevel degenerative changes seen throughout the
lumbar spine.
IMPRESSION: 1. Acute posterior fifth left rib fracture.
2. Findings likely consistent with a small hepatic cyst or
hemangioma.
3. Colonic diverticulosis.
4. Mildly enlarged prostate gland.
5. Multilevel degenerative changes of the thoracic and lumbar spine.
6. Aortic atherosclerosis.

Aortic Atherosclerosis ([Y5]-[Y5]).

## 2020-03-14 IMAGING — CR DG ANKLE COMPLETE 3+V*R*
1 series · 3 of 3 positions shown · non-contrast
Comparison: None.

CLINICAL DATA: Status post recent fall with right ankle pain.

EXAM:
RIGHT ANKLE - COMPLETE 3+ VIEW

[Series 1: dg ankle complete right · 0.14mm/px · 3 of 3 slices shown]
[im 1/3]
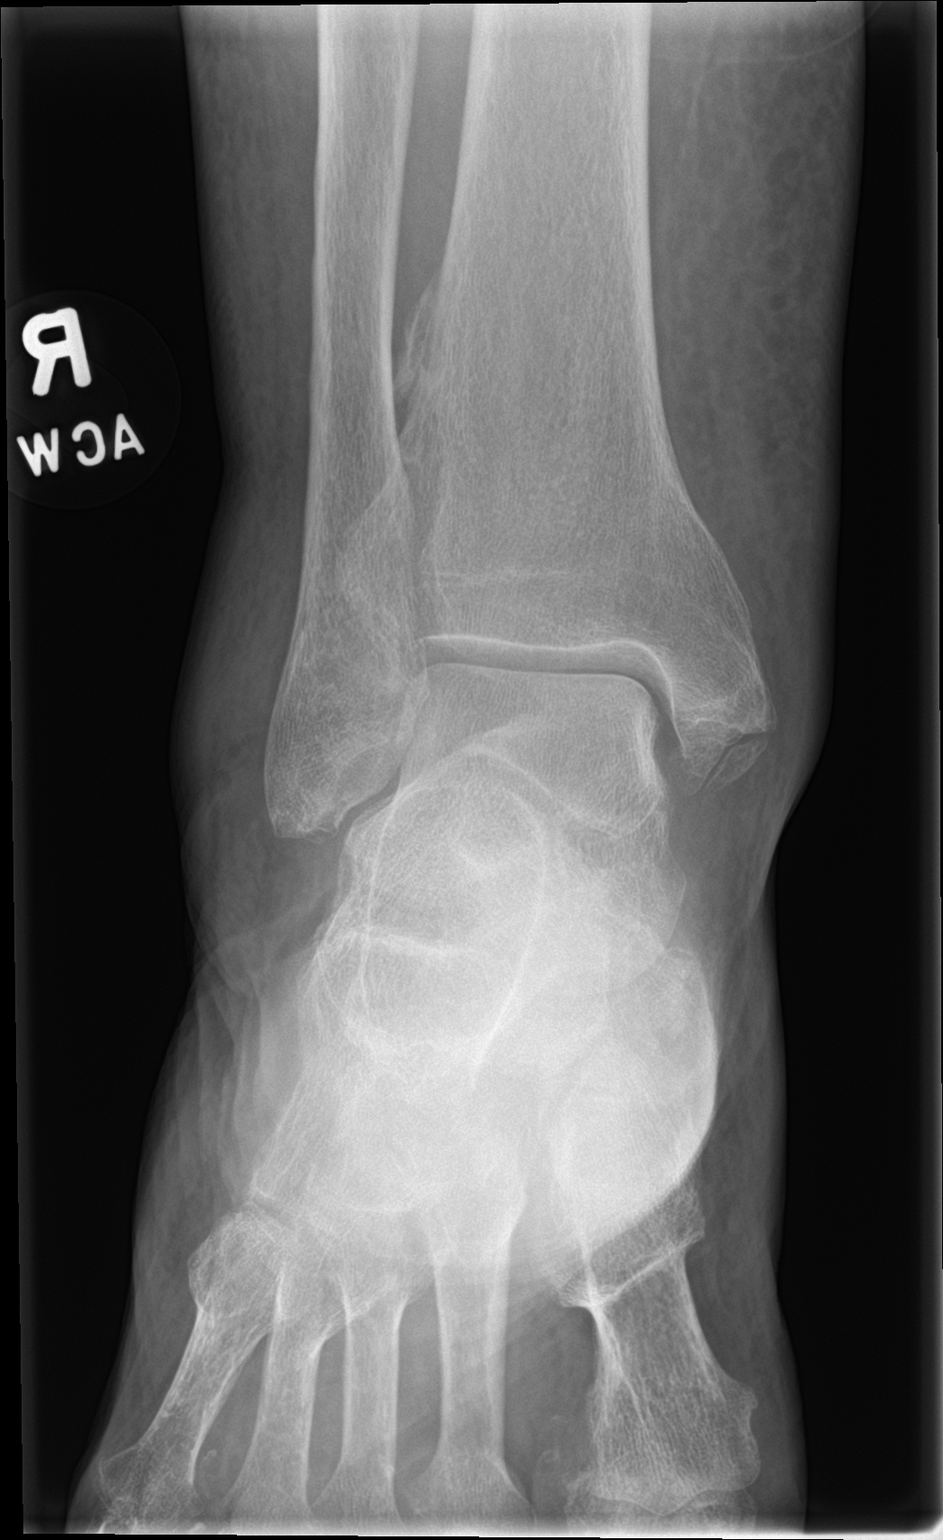
[im 2/3]
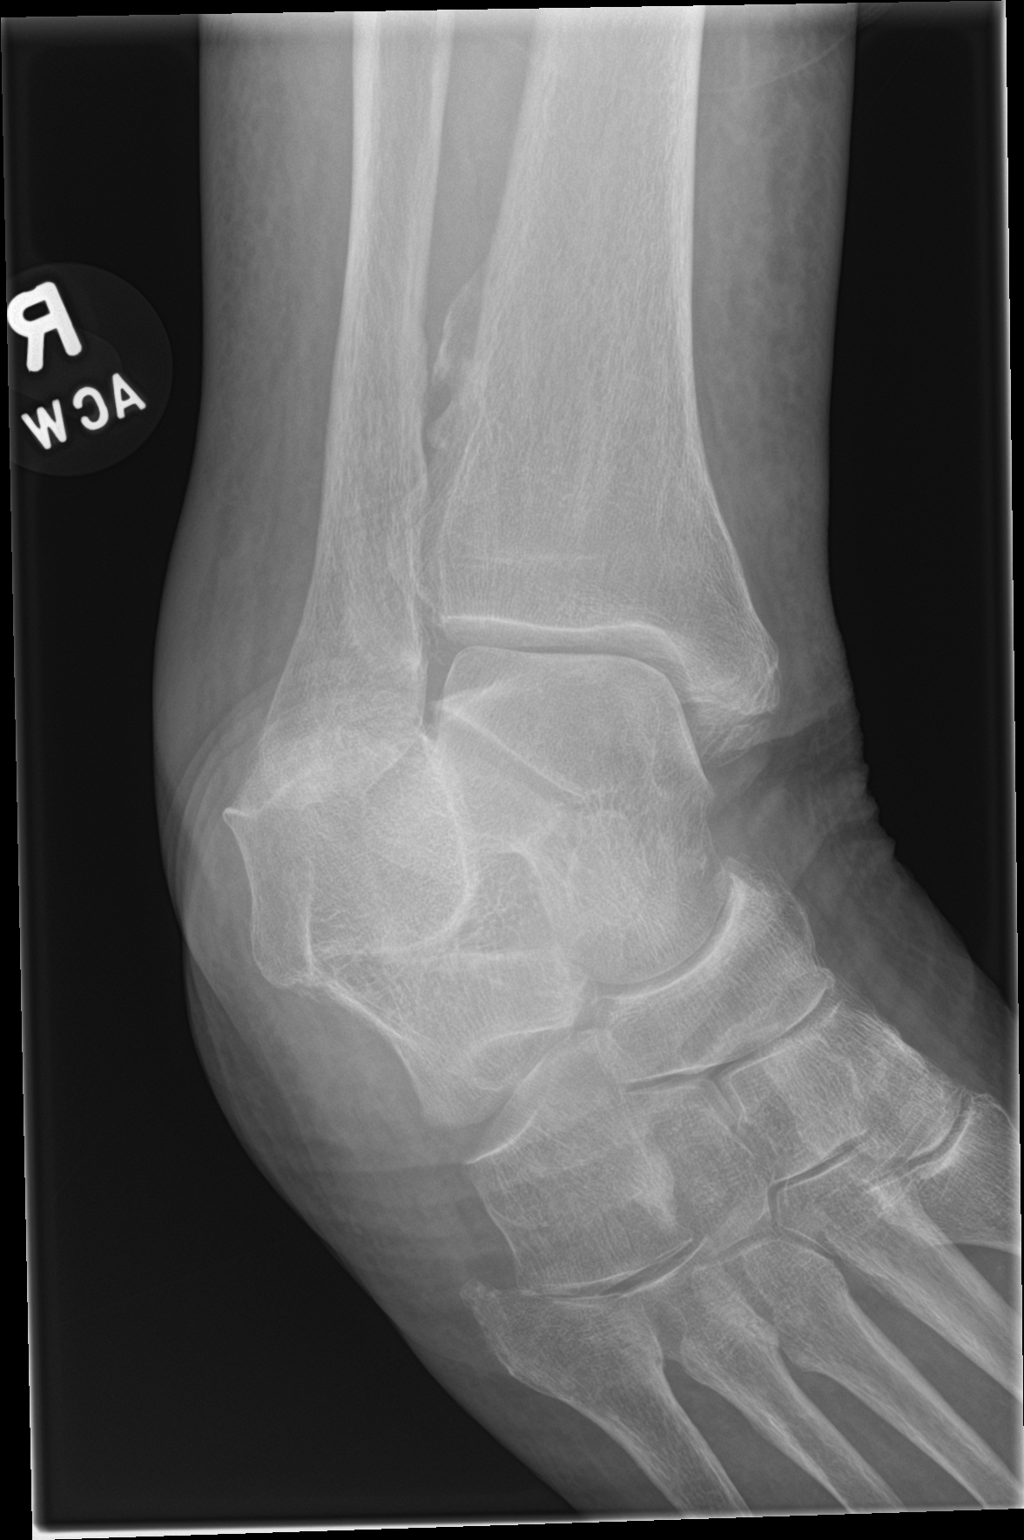
[im 3/3]
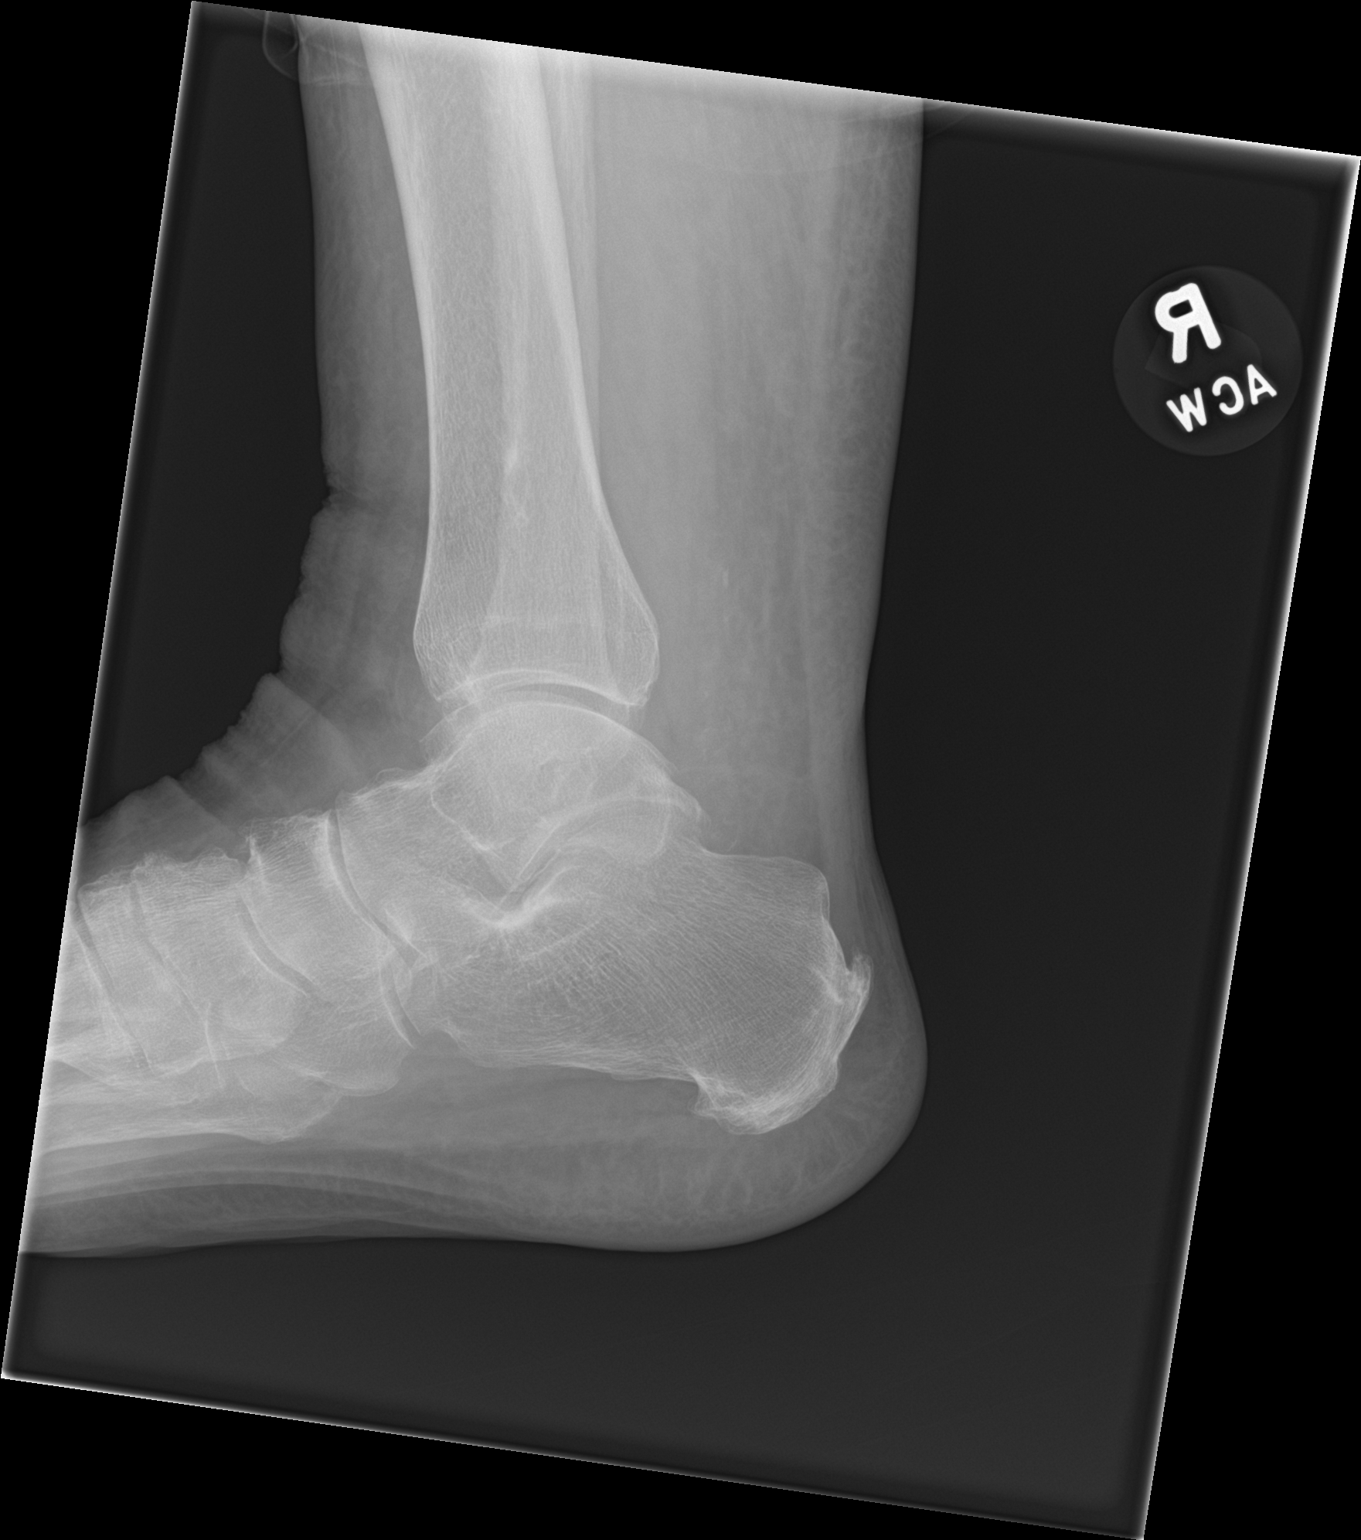

[3 of 3 positions shown; findings below may reference images not displayed]

FINDINGS: There is no evidence of acute fracture, dislocation, or joint
effusion. A small chronic cortical density is seen adjacent to the
distal aspect of the right medial malleolus. There is no evidence of
arthropathy or other focal bone abnormality. Moderate to marked
severity lateral soft tissue swelling is seen.
IMPRESSION: Moderate to marked severity lateral soft tissue swelling without
evidence of acute fracture.

## 2020-03-14 MED ORDER — MORPHINE SULFATE (PF) 4 MG/ML IV SOLN
4.0000 mg | Freq: Once | INTRAVENOUS | Status: AC
  Administered 2020-03-14: 4 mg via INTRAVENOUS
  Filled 2020-03-14: qty 1

## 2020-03-14 MED ORDER — IOHEXOL 300 MG/ML  SOLN
100.0000 mL | Freq: Once | INTRAMUSCULAR | Status: AC | PRN
  Administered 2020-03-14: 100 mL via INTRAVENOUS
  Filled 2020-03-14: qty 100

## 2020-03-14 MED ORDER — ONDANSETRON HCL 4 MG/2ML IJ SOLN
4.0000 mg | Freq: Once | INTRAMUSCULAR | Status: AC
  Administered 2020-03-14: 4 mg via INTRAVENOUS
  Filled 2020-03-14: qty 2

## 2020-03-14 MED ORDER — HYDROCODONE-ACETAMINOPHEN 5-325 MG PO TABS
1.0000 | ORAL_TABLET | Freq: Once | ORAL | Status: AC
  Administered 2020-03-14: 1 via ORAL
  Filled 2020-03-14: qty 1

## 2020-03-14 MED ORDER — HYDROCODONE-ACETAMINOPHEN 5-325 MG PO TABS
1.0000 | ORAL_TABLET | Freq: Four times a day (QID) | ORAL | 0 refills | Status: DC | PRN
Start: 2020-03-14 — End: 2020-05-19

## 2020-03-14 NOTE — ED Notes (Signed)
Pt provided urinal after hitting call bell stating he needed to urinate

## 2020-03-14 NOTE — ED Triage Notes (Signed)
Pt c/o right ankle pain x 1.5 months, pt has not seen PCP, pt reports he has appt with ortho on 12/9  Pt also states he had a fall on Wednesday, pt states he fell on his back but c/o sternal pain.  Pt states he fell because he stumbled over a brick.  Pt denies hitting his head, no LOC.

## 2020-03-14 NOTE — ED Provider Notes (Addendum)
[facility] Emergency Department Provider Note  ____________________________________________   First MD Initiated Contact with Patient 03/14/20 1646     (approximate)  I have reviewed the triage vital signs and the nursing notes.   HISTORY  Chief Complaint Ankle Pain and Chest Pain    HPI Cameron Foster. is a 79 y.o. male presents emergency department stating that he has had right ankle pain for 1.5 months.  States he has swelling in the right leg but was checked at the vascular doctors on Monday.  He did not have a blood clot.  Also states that he fell on Wednesday and landed directly on his back.  But now he has pain at the sternum going into the abdomen.  He denies chest pain or shortness of breath.  He denies any dizziness.  States he is afraid he might of done something to his organs.  He denies head injury or LOC.    Past Medical History:  Diagnosis Date   Alcoholism ([place])    Arthritis    Atrial fibrillation ([place])    one episode   Basal cell carcinoma 04/26/2017   Right medial cheek. Superficial and nodular   Basal cell carcinoma 06/11/2019   Left anterior shoulder. Nodular pattern   Basal cell carcinoma 09/09/2019   Right nasal ala, [facility]   Basal cell carcinoma 02/05/2020   L upper eyebrow, [facility]    Colon polyp    Depression    Son died 11-Jul-2014   Diverticulitis    Diverticulosis 30 years   Dysrhythmia    At Fib Jul 11, 1993   GERD (gastroesophageal reflux disease)    Hyperlipidemia    Hypertension    Irritable bowel syndrome    Prostate cancer ([place]) 07-11-10   treated with radiation therapy. Prostate   Squamous cell carcinoma of skin 07/18/2017   Left medial calf. KA type   Squamous cell carcinoma of skin 04/24/2018   Right above med. brow   Squamous cell carcinoma of skin 06/11/2019   Right posterior shoulder. SCCis, hypertrophic   Squamous cell carcinoma of skin 01/09/2020   Mid nasal dorsum, MOHS   Vasovagal syncope      Patient Active Problem List   Diagnosis Date Noted   Lymphedema 03/03/2020   Chronic venous insufficiency 03/03/2020   Fatigue 02/14/2020   Edema of right lower extremity 02/14/2020   Foraminal stenosis of lumbar region 11/08/2019   Chronic bilateral low back pain without sciatica 10/18/2019   Foraminal stenosis of cervical region 10/18/2019   PSVT (paroxysmal supraventricular tachycardia) ([place]) 08/01/2019   Lacunar infarction ([place]) 07/10/2019   History of lacunar cerebrovascular accident (CVA) 07/01/2019   Cerebrovascular accident (CVA) ([place]) 06/08/2019   Paroxysmal atrial fibrillation ([place]) 11/07/2018   TIA (transient ischemic attack) 10/20/2018   History of subarachnoid hemorrhage 10/16/2018   Recurrent sinusitis 10/16/2018   Low libido 08/30/2018   Obesity 05/14/2018   Hyperlipidemia LDL goal <70 05/14/2018   Degeneration of lumbar intervertebral disc 03/22/2018   Osteoarthritis of hip 03/22/2018   Trochanteric bursitis of right hip 03/22/2018   Status post partial colectomy 01/03/2017   Depression, major, single episode, mild ([place]) 01/03/2017   Vasovagal syncope 01/27/2016   History of skin cancer 10/28/2015   Osteoarthritis of right knee 10/01/2015   Essential hypertension 08/25/2015   Dyspnea on exertion 08/12/2015   Erectile dysfunction following radiation therapy 08/04/2015   Constipation 08/02/2015   History of prostate cancer 01/20/2015   Swelling of right lower extremity 01/20/2015  Past Surgical History:  Procedure Laterality Date   CARDIAC CATHETERIZATION  1998   [place] no stents   CATARACT EXTRACTION, BILATERAL     COLON RESECTION SIGMOID N/A 12/07/2016   Procedure: COLON RESECTION SIGMOID;  Surgeon: Clayburn Pert, MD;  Location: [facility] ORS;  Service: General;  Laterality: N/A;   COLON SURGERY  11/2016   Colostomy   COLONOSCOPY  2016   COLONOSCOPY WITH PROPOFOL N/A 05/30/2016   Procedure: COLONOSCOPY  WITH PROPOFOL;  Surgeon: Lollie Sails, MD;  Location: [facility]COPY;  Service: Endoscopy;  Laterality: N/A;   COLOSTOMY Left 12/07/2016   Procedure: COLOSTOMY;  Surgeon: Clayburn Pert, MD;  Location: [facility] ORS;  Service: General;  Laterality: Left;   COLOSTOMY REVERSAL N/A 03/21/2017   Procedure: COLOSTOMY REVERSAL;  Surgeon: Clayburn Pert, MD;  Location: [facility] ORS;  Service: General;  Laterality: N/A;   COLOSTOMY TAKEDOWN N/A 03/21/2017   Procedure: LAPAROSCOPIC COLOSTOMY TAKEDOWN;  Surgeon: Clayburn Pert, MD;  Location: [facility] ORS;  Service: General;  Laterality: N/A;   CYSTOSCOPY WITH STENT PLACEMENT Bilateral 03/21/2017   Procedure: CYSTOSCOPY WITH LIGHTED STENT PLACEMENT;  Surgeon: Abbie Sons, MD;  Location: [facility] ORS;  Service: Urology;  Laterality: Bilateral;   ESOPHAGOGASTRODUODENOSCOPY     ESOPHAGOGASTRODUODENOSCOPY N/A 05/30/2016   Procedure: ESOPHAGOGASTRODUODENOSCOPY (EGD);  Surgeon: Lollie Sails, MD;  Location: [facility] ENDOSCOPY;  Service: Endoscopy;  Laterality: N/A;   EYE SURGERY     cataracts   FLEXIBLE SIGMOIDOSCOPY N/A 03/21/2017   Procedure: FLEXIBLE SIGMOIDOSCOPY;  Surgeon: Clayburn Pert, MD;  Location: [facility] ORS;  Service: General;  Laterality: N/A;   FRACTURE SURGERY Bilateral    right arm and left wrist   INCISION AND DRAINAGE ABSCESS N/A 12/07/2016   Procedure: DRAINAGE  OF INTRA ABDOMINAL ABSCESS;  Surgeon: Clayburn Pert, MD;  Location: [facility] ORS;  Service: General;  Laterality: N/A;   KNEE ARTHROSCOPY     LAPAROTOMY N/A 12/07/2016   Procedure: EXPLORATORY LAPAROTOMY;  Surgeon: Clayburn Pert, MD;  Location: [facility] ORS;  Service: General;  Laterality: N/A;   PROSTATE SURGERY     Microwave therapy   TONSILLECTOMY     TOTAL KNEE ARTHROPLASTY Right 07/27/2016   Procedure: TOTAL KNEE ARTHROPLASTY;  Surgeon: [name], MD;  Location: [facility] ORS;  Service: Orthopedics;  Laterality: Right;  Dr. Erlene Quan had to place Urinary catheter due to  prostate cancer history.  Using flexible scope.    Prior to Admission medications   Medication Sig Start Date End Date Taking? Authorizing Provider  amLODipine (NORVASC) 5 MG tablet Take 1 tablet (5 mg total) by mouth daily. 02/10/20   [name], MD  aspirin 81 MG EC tablet Take 81 mg by mouth daily.     [provider]  atorvastatin (LIPITOR) 40 MG tablet Take 1 tablet (40 mg total) by mouth daily. 09/06/19 02/13/20  [name], MD  cetirizine (ZYRTEC) 10 MG tablet Take 1 tablet (10 mg total) by mouth daily. 12/02/19   [name], MD  Cyanocobalamin (VITAMIN B 12 PO) Take 1,000 mcg by mouth daily.    [provider]  furosemide (LASIX) 20 MG tablet TAKE 1 TABLET BY MOUTH EVERY DAY 11/19/19   End, Harrell Gave, MD  HYDROcodone-acetaminophen (NORCO/VICODIN) 5-325 MG tablet Take 1 tablet by mouth every 6 (six) hours as needed for moderate pain. 03/14/20   Jalessa Peyser, Linden Dolin, PA-C  hydrocortisone 2.5 % cream APPLY TO AFFECTED AREA TWICE A DAY AS NEEDED FOR RASH 09/03/19   Bacigalupo, Dionne Bucy, MD  lisinopril (ZESTRIL) 40 MG tablet Take 1 tablet (40 mg total) by mouth daily. 01/14/20   [name], MD  meclizine (ANTIVERT) 25 MG tablet Take 25 mg by mouth 2 (two) times daily as needed. 05/03/19   [provider]  meloxicam (MOBIC) 15 MG tablet Take 1 tablet (15 mg total) by mouth daily as needed for pain. 06/10/19   Enzo Bi, MD  metoprolol succinate (TOPROL-XL) 50 MG 24 hr tablet Take 50 mg by mouth daily. Take with or immediately following a meal.    [provider]  Multiple Vitamin (MULTIVITAMIN WITH MINERALS) TABS tablet Take 1 tablet by mouth daily. 10/24/18   Vaughan Basta, MD  omeprazole (PRILOSEC) 20 MG capsule TAKE 1 CAPSULE (20 MG TOTAL) BY MOUTH DAILY AS NEEDED (HEARTBURN). 10/24/19   [name], MD  sertraline (ZOLOFT) 100 MG tablet TAKE 1 TABLET BY MOUTH EVERY DAY 01/21/20   Bacigalupo, Dionne Bucy, MD     Allergies Formaldehyde  Family History  Problem Relation Age of Onset   Lung cancer Father        smoker   Other Mother    Sudden death Son        due to "Blood clots"   Bipolar disorder Son    Heart disease Son    Kidney disease Daughter        congenital one small kidney   Prostate cancer Neg Hx    Bladder Cancer Neg Hx     Social History Social History   Tobacco Use   Smoking status: Former Smoker    Packs/day: 1.00    Years: 27.00    Pack years: 27.00    Types: Cigarettes    Quit date: 04/18/1988    Years since quitting: 31.9   Smokeless tobacco: Former Systems developer    Types: Nurse, children's Use: Never used  Substance Use Topics   Alcohol use: Yes    Alcohol/week: 3.0 standard drinks    Types: 3 Standard drinks or equivalent per week    Comment: Occasional- Former Heavy ETOH Use   Drug use: No    Review of Systems  Constitutional: No fever/chills Eyes: No visual changes. ENT: No sore throat. Respiratory: Denies cough Cardiovascular: Positive chest pain Gastrointestinal: Positive abdominal pain Genitourinary: Negative for dysuria. Musculoskeletal: Negative for back pain. Skin: Negative for rash. Psychiatric: no mood changes,     ____________________________________________   PHYSICAL EXAM:  VITAL SIGNS: ED Triage Vitals  Enc Vitals Group     BP 03/14/20 1459 125/81     Pulse Rate 03/14/20 1459 75     Resp 03/14/20 1459 20     Temp 03/14/20 1459 99.3 F (37.4 C)     Temp Source 03/14/20 1459 Oral     SpO2 03/14/20 1459 96 %     Weight 03/14/20 1458 216 lb (98 kg)     Height 03/14/20 1458 5' 8"  (1.727 m)     Head Circumference --      Peak Flow --      Pain Score 03/14/20 1457 7     Pain Loc --      Pain Edu? --      Excl. in [place]? --     Constitutional: Alert and oriented. Well appearing and in no acute distress. Eyes: Conjunctivae are normal.  Head: Atraumatic. Nose: No congestion/rhinnorhea. Mouth/Throat: Mucous  membranes are moist.   Neck:  supple no lymphadenopathy noted Cardiovascular: Normal rate, regular rhythm. Heart  sounds are normal Respiratory: Normal respiratory effort.  No retractions, lungs c t a  Abd: soft tender along the midline of the abdomen, bs normal all 4 quad, no pulsatile mass noted GU: deferred Musculoskeletal: FROM all extremities, warm and well perfused Neurologic:  Normal speech and language.  Skin:  Skin is warm, dry and intact. No rash noted. Psychiatric: Mood and affect are normal. Speech and behavior are normal.  ____________________________________________   LABS (all labs ordered are listed, but only abnormal results are displayed)  Labs Reviewed  BASIC METABOLIC PANEL - Abnormal; Notable for the following components:      Result Value   Glucose, Bld 118 (*)    Creatinine, Ser 1.40 (*)    GFR, Estimated 51 (*)    All other components within normal limits  CBC - Abnormal; Notable for the following components:   RBC 4.03 (*)    Hemoglobin 11.7 (*)    HCT 36.3 (*)    All other components within normal limits  TROPONIN I (HIGH SENSITIVITY)  TROPONIN I (HIGH SENSITIVITY)   ____________________________________________   ____________________________________________  RADIOLOGY  X-ray of the right ankle and chest were ordered from triage. CT of the chest abdomen pelvis with contrast along with no charge L-spine and T-spine ____________________________________________   PROCEDURES  Procedure(s) performed: No  Procedures    ____________________________________________   INITIAL IMPRESSION / ASSESSMENT AND PLAN / ED COURSE  Pertinent labs & imaging results that were available during my care of the patient were reviewed by me and considered in my medical decision making (see chart for details).   Patient 79 year old male presents after a fall on Wednesday.  See HPI.  Physical exam shows patient to be tender along the abdomen but not spine.  DDx:  Spinal fracture, rib fracture, AAA, muscle strain  CBC is basically normal, metabolic panel shows creatinine increase of 1.4 with decreased GFR, troponin is 10 and 11 on repeat levels.  Feel this is stable.  EKg reviewd by me, read by physician, shows no change from previous ekg on 02/13/20  Chest x-ray did show torturous aorta.  Did not show any rib fractures  Due to the abdominal pain I do feel that we should do CT chest abdomen pelvis with concerns of liver lack versus AAA.  CT chest abdomen pelvis shows 1/5 posterior rib fracture.  No AAA or dissection.  I did review the images and radiology report.  Did explain the findings to the patient.  Be placed on pain medication for his rib fracture.  He is to follow-up with his regular doctor as needed.  Return if worsening.      Cameron Foster. was evaluated in Emergency Department on 03/14/2020 for the symptoms described in the history of present illness. He was evaluated in the context of the global COVID-19 pandemic, which necessitated consideration that the patient might be at risk for infection with the SARS-CoV-2 virus that causes COVID-19. Institutional protocols and algorithms that pertain to the evaluation of patients at risk for COVID-19 are in a state of rapid change based on information released by regulatory bodies including the CDC and federal and state organizations. These policies and algorithms were followed during the patient's care in the ED.    As part of my medical decision making, I reviewed the following data within the [place] notes reviewed and incorporated, Labs reviewed , EKG interpreted see physician read, Old EKG reviewed, Old chart reviewed, Radiograph reviewed , Notes  from prior ED visits and [place] Controlled Substance Database  ____________________________________________   FINAL CLINICAL IMPRESSION(S) / ED DIAGNOSES  Final diagnoses:  Back pain  Trauma  Closed fracture of one  rib of left side, initial encounter  Fall, initial encounter  Sprain of right ankle, unspecified ligament, initial encounter      NEW MEDICATIONS STARTED DURING THIS VISIT:  New Prescriptions   HYDROCODONE-ACETAMINOPHEN (NORCO/VICODIN) 5-325 MG TABLET    Take 1 tablet by mouth every 6 (six) hours as needed for moderate pain.     Note:  This document was prepared using Dragon voice recognition software and may include unintentional dictation errors.    Versie Starks, PA-C 03/14/20 1841    Versie Starks, PA-C 03/14/20 1849    Nance Pear, MD 03/14/20 [phone]

## 2020-03-14 NOTE — Discharge Instructions (Signed)
Follow-up with your regular doctor as needed.  Return emergency department worsening.  Use the narcotic pain medication as needed.  Use a stool softener while taking the narcotic.  Take deep breaths often keep your lungs clear from pneumonia.

## 2020-03-14 NOTE — ED Notes (Signed)
Patient transported to CT 

## 2020-03-17 ENCOUNTER — Encounter: Payer: Self-pay | Admitting: Family Medicine

## 2020-03-17 ENCOUNTER — Ambulatory Visit: Payer: Medicare Other | Admitting: Dermatology

## 2020-03-24 DIAGNOSIS — Z48817 Encounter for surgical aftercare following surgery on the skin and subcutaneous tissue: Secondary | ICD-10-CM | POA: Diagnosis not present

## 2020-03-24 DIAGNOSIS — Z85828 Personal history of other malignant neoplasm of skin: Secondary | ICD-10-CM | POA: Diagnosis not present

## 2020-03-26 DIAGNOSIS — M25571 Pain in right ankle and joints of right foot: Secondary | ICD-10-CM | POA: Diagnosis not present

## 2020-04-13 ENCOUNTER — Other Ambulatory Visit: Payer: Self-pay | Admitting: Family Medicine

## 2020-04-13 ENCOUNTER — Other Ambulatory Visit: Payer: Self-pay | Admitting: Internal Medicine

## 2020-04-13 DIAGNOSIS — I10 Essential (primary) hypertension: Secondary | ICD-10-CM

## 2020-04-13 NOTE — Telephone Encounter (Signed)
Rx request sent to pharmacy.  

## 2020-04-13 NOTE — Telephone Encounter (Signed)
Requested Prescriptions  Pending Prescriptions Disp Refills  . amLODipine (NORVASC) 10 MG tablet [Pharmacy Med Name: AMLODIPINE BESYLATE 10 MG TAB] 90 tablet     Sig: TAKE 1 TABLET BY MOUTH EVERY DAY     Cardiovascular:  Calcium Channel Blockers Failed - 04/13/2020 12:55 PM      Failed - Last BP in normal range    BP Readings from Last 1 Encounters:  03/14/20 (!) 146/85         Passed - Valid encounter within last 6 months    Recent Outpatient Visits          2 months ago Essential hypertension   [facility] [place], Dionne Bucy, MD   3 months ago Swelling of right lower extremity   [facility] [place], Dionne Bucy, MD   4 months ago Essential hypertension   [place], Dionne Bucy, MD   8 months ago Mixed hyperlipidemia   [facility], Dionne Bucy, MD   9 months ago History of lacunar cerebrovascular accident (CVA)   [place], Dionne Bucy, MD      Future Appointments            In 3 weeks [name], MD [place]   In 51 month End, Christopher, MD [facility] [address]           . lisinopril (ZESTRIL) 10 MG tablet [Pharmacy Med Name: LISINOPRIL 10 MG TABLET] 90 tablet     Sig: TAKE 1 TABLET BY MOUTH EVERY DAY     Cardiovascular:  ACE Inhibitors Failed - 04/13/2020 12:55 PM      Failed - Cr in normal range and within 180 days    Creat  Date Value Ref Range Status  08/12/2015 1.40 (H) 0.70 - 1.18 mg/dL Final   Creatinine, Ser  Date Value Ref Range Status  03/14/2020 1.40 (H) 0.61 - 1.24 mg/dL Final         Failed - Last BP in normal range    BP Readings from Last 1 Encounters:  03/14/20 (!) 146/85         Passed - K in normal range and within 180 days    Potassium  Date Value Ref Range Status  03/14/2020 3.6 3.5 - 5.1 mmol/L Final         Passed - Patient is not pregnant      Passed - Valid encounter within last 6 months     Recent Outpatient Visits          2 months ago Essential hypertension   [place], Dionne Bucy, MD   3 months ago Swelling of right lower extremity   [facility] [place], Dionne Bucy, MD   4 months ago Essential hypertension   [place], Dionne Bucy, MD   8 months ago Mixed hyperlipidemia   [facility], Dionne Bucy, MD   9 months ago History of lacunar cerebrovascular accident (CVA)   [place], Dionne Bucy, MD      Future Appointments            In 3 weeks [name], MD [place]   In 2 month End, Christopher, MD [facility] [place]           . omeprazole (PRILOSEC) 20 MG capsule [Pharmacy Med Name: OMEPRAZOLE DR 20 MG CAPSULE] 90 capsule 1    Sig: TAKE 1 CAPSULE (20 MG  TOTAL) BY MOUTH DAILY AS NEEDED (HEARTBURN).     Gastroenterology: Proton Pump Inhibitors Passed - 04/13/2020 12:55 PM      Passed - Valid encounter within last 12 months    Recent Outpatient Visits          2 months ago Essential hypertension   [facility] [place], Dionne Bucy, MD   3 months ago Swelling of right lower extremity   [facility] [place], Dionne Bucy, MD   4 months ago Essential hypertension   [place], Dionne Bucy, MD   8 months ago Mixed hyperlipidemia   [facility], Dionne Bucy, MD   9 months ago History of lacunar cerebrovascular accident (CVA)   [place], Dionne Bucy, MD      Future Appointments            In 3 weeks [name], MD [place]   In 1 month End, Harrell Gave, MD [facility] [place]           . amLODipine (NORVASC) 5 MG tablet [Pharmacy Med Name: AMLODIPINE BESYLATE 5 MG TAB] 90 tablet     Sig: TAKE 1 TABLET BY MOUTH EVERY DAY     Cardiovascular:  Calcium Channel Blockers Failed -  04/13/2020 12:55 PM      Failed - Last BP in normal range    BP Readings from Last 1 Encounters:  03/14/20 (!) 146/85         Passed - Valid encounter within last 6 months    Recent Outpatient Visits          2 months ago Essential hypertension   [facility] [place], Dionne Bucy, MD   3 months ago Swelling of right lower extremity   [facility] [place], Dionne Bucy, MD   4 months ago Essential hypertension   [place], Dionne Bucy, MD   8 months ago Mixed hyperlipidemia   [facility], Dionne Bucy, MD   9 months ago History of lacunar cerebrovascular accident (CVA)   [place], Dionne Bucy, MD      Future Appointments            In 3 weeks [name], MD [place]   In 1 month End, Harrell Gave, MD [facility], [place]

## 2020-04-16 DIAGNOSIS — M25571 Pain in right ankle and joints of right foot: Secondary | ICD-10-CM | POA: Diagnosis not present

## 2020-04-16 DIAGNOSIS — M722 Plantar fascial fibromatosis: Secondary | ICD-10-CM | POA: Diagnosis not present

## 2020-04-30 DIAGNOSIS — M7660 Achilles tendinitis, unspecified leg: Secondary | ICD-10-CM | POA: Insufficient documentation

## 2020-04-30 DIAGNOSIS — M7661 Achilles tendinitis, right leg: Secondary | ICD-10-CM | POA: Diagnosis not present

## 2020-04-30 DIAGNOSIS — M65871 Other synovitis and tenosynovitis, right ankle and foot: Secondary | ICD-10-CM | POA: Diagnosis not present

## 2020-04-30 DIAGNOSIS — M19071 Primary osteoarthritis, right ankle and foot: Secondary | ICD-10-CM | POA: Diagnosis not present

## 2020-04-30 DIAGNOSIS — M659 Synovitis and tenosynovitis, unspecified: Secondary | ICD-10-CM | POA: Insufficient documentation

## 2020-05-05 ENCOUNTER — Encounter: Payer: Self-pay | Admitting: Family Medicine

## 2020-05-05 ENCOUNTER — Ambulatory Visit: Payer: Medicare Other | Admitting: Dermatology

## 2020-05-14 ENCOUNTER — Other Ambulatory Visit: Payer: Self-pay | Admitting: Family Medicine

## 2020-05-14 DIAGNOSIS — I10 Essential (primary) hypertension: Secondary | ICD-10-CM

## 2020-05-14 NOTE — Progress Notes (Signed)
Subjective:   Cameron Foster. is a 80 y.o. male who presents for Medicare Annual/Subsequent preventive examination.  I connected with Cameron Foster today by telephone and verified that I am speaking with the correct person using two identifiers. Location patient: home Location provider: work Persons participating in the virtual visit: patient, provider.   I discussed the limitations, risks, security and privacy concerns of performing an evaluation and management service by telephone and the availability of in person appointments. I also discussed with the patient that there may be a patient responsible charge related to this service. The patient expressed understanding and verbally consented to this telephonic visit.    Interactive audio and video telecommunications were attempted between this provider and patient, however failed, due to patient having technical difficulties OR patient did not have access to video capability.  We continued and completed visit with audio only.   Review of Systems    N/A  Cardiac Risk Factors include: advanced age (>3mn, >>84women);dyslipidemia;hypertension;male gender     Objective:    There were no vitals filed for this visit. There is no height or weight on file to calculate BMI.  Advanced Directives 05/18/2020 03/14/2020 06/08/2019 06/08/2019 02/18/2019 10/20/2018 10/14/2018  Does Patient Have a Medical Advance Directive? Yes Yes Yes Yes Yes Yes Yes  Type of AParamedicof ASutterLiving will Living will HGraylingLiving will HNormanLiving will HSalineLiving will HMarengoLiving will HWaimanalo BeachLiving will  Does patient want to make changes to medical advance directive? - No - Patient declined No - Patient declined - - No - Patient declined No - Patient declined  Copy of [place]rasin Chart? Yes - validated most  recent copy scanned in chart (See row information) - No - copy requested - - No - copy requested No - copy requested  Would patient like information on creating a medical advance directive? - No - Patient declined - - - - -    Current Medications (verified) Outpatient Encounter Medications as of 05/18/2020  Medication Sig  . amLODipine (NORVASC) 5 MG tablet TAKE 1 TABLET BY MOUTH EVERY DAY  . aspirin 81 MG EC tablet Take 81 mg by mouth daily.   .Marland Kitchenatorvastatin (LIPITOR) 40 MG tablet Take 1 tablet (40 mg total) by mouth daily.  . cetirizine (ZYRTEC) 10 MG tablet Take 1 tablet (10 mg total) by mouth daily.  . Cyanocobalamin (VITAMIN B 12 PO) Take 1,000 mcg by mouth daily.  . furosemide (LASIX) 20 MG tablet TAKE 1 TABLET BY MOUTH EVERY DAY  . hydrocortisone 2.5 % cream APPLY TO AFFECTED AREA TWICE A DAY AS NEEDED FOR RASH  . lisinopril (ZESTRIL) 40 MG tablet TAKE 1 TABLET BY MOUTH EVERY DAY  . meloxicam (MOBIC) 15 MG tablet Take 1 tablet (15 mg total) by mouth daily as needed for pain.  . metoprolol succinate (TOPROL-XL) 50 MG 24 hr tablet Take 50 mg by mouth daily. Take with or immediately following a meal.  . Multiple Vitamin (MULTIVITAMIN WITH MINERALS) TABS tablet Take 1 tablet by mouth daily.  .Marland Kitchenomeprazole (PRILOSEC) 20 MG capsule TAKE 1 CAPSULE (20 MG TOTAL) BY MOUTH DAILY AS NEEDED (HEARTBURN).  .Marland Kitchensertraline (ZOLOFT) 100 MG tablet TAKE 1 TABLET BY MOUTH EVERY DAY  . HYDROcodone-acetaminophen (NORCO/VICODIN) 5-325 MG tablet Take 1 tablet by mouth every 6 (six) hours as needed for moderate pain. (Patient not taking: Reported on  05/18/2020)  . meclizine (ANTIVERT) 25 MG tablet Take 25 mg by mouth 2 (two) times daily as needed. (Patient not taking: Reported on 05/18/2020)  . [DISCONTINUED] amLODipine (NORVASC) 5 MG tablet Take 1 tablet (5 mg total) by mouth daily.  . [DISCONTINUED] lisinopril (ZESTRIL) 40 MG tablet Take 1 tablet (40 mg total) by mouth daily.  . [DISCONTINUED] sertraline (ZOLOFT)  100 MG tablet TAKE 1 TABLET BY MOUTH EVERY DAY   No facility-administered encounter medications on file as of 05/18/2020.    Allergies (verified) Formaldehyde   History: Past Medical History:  Diagnosis Date  . Alcoholism ([place])   . Arthritis   . Atrial fibrillation ([place])    one episode  . Basal cell carcinoma 04/26/2017   Right medial cheek. Superficial and nodular  . Basal cell carcinoma 06/11/2019   Left anterior shoulder. Nodular pattern  . Basal cell carcinoma 09/09/2019   Right nasal ala, [facility]  . Basal cell carcinoma 02/05/2020   L upper eyebrow, [facility]   . Colon polyp   . Depression    Son died 07/12/2014  . Diverticulitis   . Diverticulosis 30 years  . Dysrhythmia    At Fib 1995  . GERD (gastroesophageal reflux disease)   . Hyperlipidemia   . Hypertension   . Irritable bowel syndrome   . Prostate cancer ([place]) 07-12-2010   treated with radiation therapy. Prostate  . Squamous cell carcinoma of skin 07/18/2017   Left medial calf. KA type  . Squamous cell carcinoma of skin 04/24/2018   Right above med. brow  . Squamous cell carcinoma of skin 06/11/2019   Right posterior shoulder. SCCis, hypertrophic  . Squamous cell carcinoma of skin 01/09/2020   Mid nasal dorsum, MOHS  . Vasovagal syncope    Past Surgical History:  Procedure Laterality Date  . [place] no stents  . CATARACT EXTRACTION, BILATERAL    . COLON RESECTION SIGMOID N/A 12/07/2016   Procedure: COLON RESECTION SIGMOID;  Surgeon: Clayburn Pert, MD;  Location: [facility] ORS;  Service: General;  Laterality: N/A;  . COLON SURGERY  11/2016   Colostomy  . COLONOSCOPY  07-12-14  . COLONOSCOPY WITH PROPOFOL N/A 05/30/2016   Procedure: COLONOSCOPY WITH PROPOFOL;  Surgeon: Lollie Sails, MD;  Location[facility] ENDOSCOPY;  Service: Endoscopy;  Laterality: N/A;  . COLOSTOMY Left 12/07/2016   Procedure: COLOSTOMY;  Surgeon: Clayburn Pert, MD;  Location: [facility] ORS;  Service: General;  Laterality:  Left;  . COLOSTOMY REVERSAL N/A 03/21/2017   Procedure: COLOSTOMY REVERSAL;  Surgeon: Clayburn Pert, MD;  Location: [facility] ORS;  Service: General;  Laterality: N/A;  . COLOSTOMY TAKEDOWN N/A 03/21/2017   Procedure: LAPAROSCOPIC COLOSTOMY TAKEDOWN;  Surgeon: Clayburn Pert, MD;  Location: [facility] ORS;  Service: General;  Laterality: N/A;  . CYSTOSCOPY WITH STENT PLACEMENT Bilateral 03/21/2017   Procedure: CYSTOSCOPY WITH LIGHTED STENT PLACEMENT;  Surgeon: Abbie Sons, MD;  Location: [facility] ORS;  Service: Urology;  Laterality: Bilateral;  . ESOPHAGOGASTRODUODENOSCOPY    . ESOPHAGOGASTRODUODENOSCOPY N/A 05/30/2016   Procedure: ESOPHAGOGASTRODUODENOSCOPY (EGD);  Surgeon: Lollie Sails, MD;  Location: [facility] ENDOSCOPY;  Service: Endoscopy;  Laterality: N/A;  . EYE SURGERY     cataracts  . FLEXIBLE SIGMOIDOSCOPY N/A 03/21/2017   Procedure: FLEXIBLE SIGMOIDOSCOPY;  Surgeon: Clayburn Pert, MD;  Location: [facility] ORS;  Service: General;  Laterality: N/A;  . FRACTURE SURGERY Bilateral    right arm and left wrist  . INCISION AND DRAINAGE ABSCESS N/A 12/07/2016   Procedure: DRAINAGE  OF INTRA ABDOMINAL ABSCESS;  Surgeon: Clayburn Pert, MD;  Location: [facility] ORS;  Service: General;  Laterality: N/A;  . KNEE ARTHROSCOPY    . LAPAROTOMY N/A 12/07/2016   Procedure: EXPLORATORY LAPAROTOMY;  Surgeon: Clayburn Pert, MD;  Location: [facility] ORS;  Service: General;  Laterality: N/A;  . PROSTATE SURGERY     Microwave therapy  . TONSILLECTOMY    . TOTAL KNEE ARTHROPLASTY Right 07/27/2016   Procedure: TOTAL KNEE ARTHROPLASTY;  Surgeon: [name], MD;  Location: [facility] ORS;  Service: Orthopedics;  Laterality: Right;  Dr. Erlene Quan had to place Urinary catheter due to prostate cancer history.  Using flexible scope.   Family History  Problem Relation Age of Onset  . Lung cancer Father        smoker  . Other Mother   . Sudden death Son        due to "Blood clots"  . Bipolar disorder Son   . Heart disease Son    . Kidney disease Daughter        congenital one small kidney  . Prostate cancer Neg Hx   . Bladder Cancer Neg Hx    Social History   Socioeconomic History  . Marital status: Married    Spouse name: Not on file  . Number of children: 2  . Years of education: Not on file  . Highest education level: Bachelor's degree (e.g., BA, AB, BS)  Occupational History  . Occupation: retired Dance movement psychotherapist  Tobacco Use  . Smoking status: Former Smoker    Packs/day: 1.00    Years: 27.00    Pack years: 27.00    Types: Cigarettes    Quit date: 04/18/1988    Years since quitting: 32.1  . Smokeless tobacco: Former Systems developer    Types: Secondary school teacher  . Vaping Use: Never used  Substance and Sexual Activity  . Alcohol use: Yes    Alcohol/week: 7.0 standard drinks    Types: 7 Glasses of wine per week    Comment: Formal heavy use ETOH  . Drug use: No  . Sexual activity: Not Currently    Birth control/protection: None    Comment: Married  Other Topics Concern  . Not on file  Social History Narrative  . Not on file   Social Determinants of Health   Financial Resource Strain: Low Risk   . Difficulty of Paying Living Expenses: Not hard at all  Food Insecurity: No Food Insecurity  . Worried About Charity fundraiser in the Last Year: Never true  . Ran Out of Food in the Last Year: Never true  Transportation Needs: No Transportation Needs  . Lack of Transportation (Medical): No  . Lack of Transportation (Non-Medical): No  Physical Activity: Inactive  . Days of Exercise per Week: 0 days  . Minutes of Exercise per Session: 0 min  Stress: No Stress Concern Present  . Feeling of Stress : Not at all  Social Connections: Socially Integrated  . Frequency of Communication with Friends and Family: More than three times a week  . Frequency of Social Gatherings with Friends and Family: Twice a week  . Attends Religious Services: More than 4 times per year  . Active Member of Clubs or Organizations: Yes   . Attends Archivist Meetings: More than 4 times per year  . Marital Status: Married    Tobacco Counseling Counseling given: Not Answered   Clinical Intake:  Pre-visit preparation completed: Yes  Pain : No/denies pain (Has  right ankle pain with standing and movement.)     Nutritional Risks: None Diabetes: No  How often do you need to have someone help you when you read instructions, pamphlets, or other written materials from your doctor or pharmacy?: 1 - Never  Diabetic? No  Interpreter Needed?: No  Information entered by :: [facility], LPN   Activities of Daily Living In your present state of health, do you have any difficulty performing the following activities: 05/18/2020 01/14/2020  Hearing? N N  Vision? N N  Difficulty concentrating or making decisions? N N  Walking or climbing stairs? Y Y  Comment Due to right ankle pain and SOB. -  Dressing or bathing? N N  Doing errands, shopping? N N  Preparing Food and eating ? N -  Using the Toilet? N -  In the past six months, have you accidently leaked urine? Y -  Comment Occasionally will dribble after emptying bladder. -  Do you have problems with loss of bowel control? N -  Managing your Medications? N -  Managing your Finances? N -  Housekeeping or managing your Housekeeping? N -  Some recent data might be hidden    Patient Care Team: [name], MD as PCP - General (Family Medicine) End, Harrell Gave, MD as Consulting Physician (Cardiology) [name], MD (Orthopedic Surgery) Schnier, Dolores Lory, MD (Vascular Surgery) [name], MD (Dermatology) [name], MD as Consulting Physician (Ophthalmology)  Indicate any recent Medical Services you may have received from other than [facility] providers in the past year (date may be approximate).     Assessment:   This is a routine wellness examination for [facility].  Hearing/Vision screen No exam data present  Dietary issues and exercise  activities discussed: Current Exercise Habits: The patient does not participate in regular exercise at present, Exercise limited by: orthopedic condition(s)  Goals    . DIET - INCREASE WATER INTAKE     Recommend to drink at least 6-8 8oz glasses of water per day.      Depression Screen PHQ 2/9 Scores 05/18/2020 01/14/2020 12/02/2019 08/14/2019 08/14/2019 05/15/2019 05/14/2018  PHQ - 2 Score 0 0 0 0 0 0 0  PHQ- 9 Score - 0 2 0 - 2 0    Fall Risk Fall Risk  05/18/2020 01/14/2020 08/14/2019 05/15/2019 05/14/2018  Falls in the past year? 1 0 0 0 1  Comment accidental fall - - - -  Number falls in past yr: 0 - 0 0 1  Comment - - - - -  Injury with Fall? 1 0 0 0 1  Comment broken rib - - - -  Risk for fall due to : No Fall Risks - No Fall Risks No Fall Risks -  Follow up Falls prevention discussed - Falls evaluation completed Falls evaluation completed -    FALL RISK PREVENTION PERTAINING TO THE HOME:  Any stairs in or around the home? No  If so, are there any without handrails? No  Home free of loose throw rugs in walkways, pet beds, electrical cords, etc? Yes  Adequate lighting in your home to reduce risk of falls? Yes   ASSISTIVE DEVICES UTILIZED TO PREVENT FALLS:  Life alert? No  Use of a cane, walker or w/c? Yes  Grab bars in the bathroom? No  Shower chair or bench in shower? No  Elevated toilet seat or a handicapped toilet? No    Cognitive Function: Normal cognitive status assessed by observation by this Nurse Health Advisor. No  abnormalities found.       6CIT Screen 09/02/2016  What Year? 0 points  What month? 0 points  What time? 0 points  Count back from 20 0 points  Months in reverse 0 points  Repeat phrase 0 points  Total Score 0    Immunizations Immunization History  Administered Date(s) Administered  . Fluad Quad(high Dose 65+) 01/14/2020  . Influenza, High Dose Seasonal PF 01/26/2017, 02/01/2018, 01/31/2019  . Influenza,inj,Quad PF,6+ Mos 02/08/2016  .  Influenza,inj,quad, With Preservative 12/18/2014  . PFIZER(Purple Top)SARS-COV-2 Vaccination 05/31/2019, 06/25/2019, 01/22/2020  . Pneumococcal Conjugate-13 05/14/2018  . Pneumococcal Polysaccharide-23 12/18/2014  . Pneumococcal-Unspecified 12/18/2014  . Tdap 01/14/2020    TDAP status: Up to date  Flu Vaccine status: Up to date  Pneumococcal vaccine status: Up to date  Covid-19 vaccine status: Completed vaccines  Qualifies for Shingles Vaccine? Yes   Zostavax completed No   Shingrix Completed?: No.    Education has been provided regarding the importance of this vaccine. Patient has been advised to call insurance company to determine out of pocket expense if they have not yet received this vaccine. Advised may also receive vaccine at local pharmacy or Health Dept. Verbalized acceptance and understanding.  Screening Tests Health Maintenance  Topic Date Due  . COLONOSCOPY (Pts 45-49yr Insurance coverage will need to be confirmed)  05/31/2019  . COVID-19 Vaccine (4 - Booster for Pfizer series) 07/22/2020  . TETANUS/TDAP  01/13/2030  . INFLUENZA VACCINE  Completed  . PNA vac Low Risk Adult  Completed    Health Maintenance  Health Maintenance Due  Topic Date Due  . COLONOSCOPY (Pts 45-445yrInsurance coverage will need to be confirmed)  05/31/2019    Colorectal cancer screening: Currently due, declined a colonoscopy referral. Agreed to completing a cologuard kit. Ordered today.   Lung Cancer Screening: (Low Dose CT Chest recommended if Age 80-80ears, 30 pack-year currently smoking OR have quit w/in 15years.) does not qualify.   Additional Screening:  Vision Screening: Recommended annual ophthalmology exams for early detection of glaucoma and other disorders of the eye. Is the patient up to date with their annual eye exam?  Yes  Who is the provider or what is the name of the office in which the patient attends annual eye exams? Dr [place] is not established with a  provider, would they like to be referred to a provider to establish care? No .   Dental Screening: Recommended annual dental exams for proper oral hygiene  Community Resource Referral / Chronic Care Management: CRR required this visit?  No   CCM required this visit?  No      Plan:     I have personally reviewed and noted the following in the patient's chart:   . Medical and social history . Use of alcohol, tobacco or illicit drugs  . Current medications and supplements . Functional ability and status . Nutritional status . Physical activity . Advanced directives . List of other physicians . Hospitalizations, surgeries, and ER visits in previous 12 months . Vitals . Screenings to include cognitive, depression, and falls . Referrals and appointments  In addition, I have reviewed and discussed with patient certain preventive protocols, quality metrics, and best practice recommendations. A written personalized care plan for preventive services as well as general preventive health recommendations were provided to patient.     Gianny Sabino Ma[place]ans[place] Park[place] 04/27/39/7408 Nurse Notes: Declined a colonoscopy referral but agreed to a cologuard order.

## 2020-05-15 ENCOUNTER — Encounter: Payer: Self-pay | Admitting: Family Medicine

## 2020-05-18 ENCOUNTER — Ambulatory Visit (INDEPENDENT_AMBULATORY_CARE_PROVIDER_SITE_OTHER): Payer: Medicare Other

## 2020-05-18 ENCOUNTER — Other Ambulatory Visit: Payer: Self-pay

## 2020-05-18 DIAGNOSIS — Z1211 Encounter for screening for malignant neoplasm of colon: Secondary | ICD-10-CM | POA: Diagnosis not present

## 2020-05-18 DIAGNOSIS — Z0000 Encounter for general adult medical examination without abnormal findings: Secondary | ICD-10-CM

## 2020-05-18 NOTE — Patient Instructions (Addendum)
Cameron Foster , Thank you for taking time to come for your Medicare Wellness Visit. I appreciate your ongoing commitment to your health goals. Please review the following plan we discussed and let me know if I can assist you in the future.   Screening recommendations/referrals: Colonoscopy: Currently due, declined a colonoscopy referral. Cologuard ordered today.  Recommended yearly ophthalmology/optometry visit for glaucoma screening and checkup Recommended yearly dental visit for hygiene and checkup  Vaccinations: Influenza vaccine: Done 01/14/20 Pneumococcal vaccine: Completed series Tdap vaccine: Up to date, due 12/2029 Shingles vaccine: Shingrix discussed. Please contact your pharmacy for coverage information.     Advanced directives: Currently on file.  Conditions/risks identified: Recommend to drink at least 6-8 8oz glasses of water per day.  Next appointment: 05/19/20 @ 3:00 PM with Dr Brita Romp   Preventive Care 31 Years and Older, Male Preventive care refers to lifestyle choices and visits with your health care provider that can promote health and wellness. What does preventive care include?  A yearly physical exam. This is also called an annual well check.  Dental exams once or twice a year.  Routine eye exams. Ask your health care provider how often you should have your eyes checked.  Personal lifestyle choices, including:  Daily care of your teeth and gums.  Regular physical activity.  Eating a healthy diet.  Avoiding tobacco and drug use.  Limiting alcohol use.  Practicing safe sex.  Taking low doses of aspirin every day.  Taking vitamin and mineral supplements as recommended by your health care provider. What happens during an annual well check? The services and screenings done by your health care provider during your annual well check will depend on your age, overall health, lifestyle risk factors, and family history of disease. Counseling  Your health care  provider may ask you questions about your:  Alcohol use.  Tobacco use.  Drug use.  Emotional well-being.  Home and relationship well-being.  Sexual activity.  Eating habits.  History of falls.  Memory and ability to understand (cognition).  Work and work Statistician. Screening  You may have the following tests or measurements:  Height, weight, and BMI.  Blood pressure.  Lipid and cholesterol levels. These may be checked every 5 years, or more frequently if you are over 52 years old.  Skin check.  Lung cancer screening. You may have this screening every year starting at age 77 if you have a 30-pack-year history of smoking and currently smoke or have quit within the past 15 years.  Fecal occult blood test (FOBT) of the stool. You may have this test every year starting at age 45.  Flexible sigmoidoscopy or colonoscopy. You may have a sigmoidoscopy every 5 years or a colonoscopy every 10 years starting at age 25.  Prostate cancer screening. Recommendations will vary depending on your family history and other risks.  Hepatitis C blood test.  Hepatitis B blood test.  Sexually transmitted disease (STD) testing.  Diabetes screening. This is done by checking your blood sugar (glucose) after you have not eaten for a while (fasting). You may have this done every 1-3 years.  Abdominal aortic aneurysm (AAA) screening. You may need this if you are a current or former smoker.  Osteoporosis. You may be screened starting at age 55 if you are at high risk. Talk with your health care provider about your test results, treatment options, and if necessary, the need for more tests. Vaccines  Your health care provider may recommend certain vaccines, such as:  Influenza vaccine. This is recommended every year.  Tetanus, diphtheria, and acellular pertussis (Tdap, Td) vaccine. You may need a Td booster every 10 years.  Zoster vaccine. You may need this after age 31.  Pneumococcal  13-valent conjugate (PCV13) vaccine. One dose is recommended after age 73.  Pneumococcal polysaccharide (PPSV23) vaccine. One dose is recommended after age 15. Talk to your health care provider about which screenings and vaccines you need and how often you need them. This information is not intended to replace advice given to you by your health care provider. Make sure you discuss any questions you have with your health care provider. Document Released: 05/01/2015 Document Revised: 12/23/2015 Document Reviewed: 02/03/2015 Elsevier Interactive Patient Education  2017 [employer] Prevention in the Home Falls can cause injuries. They can happen to people of all ages. There are many things you can do to make your home safe and to help prevent falls. What can I do on the outside of my home?  Regularly fix the edges of walkways and driveways and fix any cracks.  Remove anything that might make you trip as you walk through a door, such as a raised step or threshold.  Trim any bushes or trees on the path to your home.  Use bright outdoor lighting.  Clear any walking paths of anything that might make someone trip, such as rocks or tools.  Regularly check to see if handrails are loose or broken. Make sure that both sides of any steps have handrails.  Any raised decks and porches should have guardrails on the edges.  Have any leaves, snow, or ice cleared regularly.  Use sand or salt on walking paths during winter.  Clean up any spills in your garage right away. This includes oil or grease spills. What can I do in the bathroom?  Use night lights.  Install grab bars by the toilet and in the tub and shower. Do not use towel bars as grab bars.  Use non-skid mats or decals in the tub or shower.  If you need to sit down in the shower, use a plastic, non-slip stool.  Keep the floor dry. Clean up any water that spills on the floor as soon as it happens.  Remove soap buildup in the  tub or shower regularly.  Attach bath mats securely with double-sided non-slip rug tape.  Do not have throw rugs and other things on the floor that can make you trip. What can I do in the bedroom?  Use night lights.  Make sure that you have a light by your bed that is easy to reach.  Do not use any sheets or blankets that are too big for your bed. They should not hang down onto the floor.  Have a firm chair that has side arms. You can use this for support while you get dressed.  Do not have throw rugs and other things on the floor that can make you trip. What can I do in the kitchen?  Clean up any spills right away.  Avoid walking on wet floors.  Keep items that you use a lot in easy-to-reach places.  If you need to reach something above you, use a strong step stool that has a grab bar.  Keep electrical cords out of the way.  Do not use floor polish or wax that makes floors slippery. If you must use wax, use non-skid floor wax.  Do not have throw rugs and other things on the floor that  can make you trip. What can I do with my stairs?  Do not leave any items on the stairs.  Make sure that there are handrails on both sides of the stairs and use them. Fix handrails that are broken or loose. Make sure that handrails are as long as the stairways.  Check any carpeting to make sure that it is firmly attached to the stairs. Fix any carpet that is loose or worn.  Avoid having throw rugs at the top or bottom of the stairs. If you do have throw rugs, attach them to the floor with carpet tape.  Make sure that you have a light switch at the top of the stairs and the bottom of the stairs. If you do not have them, ask someone to add them for you. What else can I do to help prevent falls?  Wear shoes that:  Do not have high heels.  Have rubber bottoms.  Are comfortable and fit you well.  Are closed at the toe. Do not wear sandals.  If you use a stepladder:  Make sure that it  is fully opened. Do not climb a closed stepladder.  Make sure that both sides of the stepladder are locked into place.  Ask someone to hold it for you, if possible.  Clearly mark and make sure that you can see:  Any grab bars or handrails.  First and last steps.  Where the edge of each step is.  Use tools that help you move around (mobility aids) if they are needed. These include:  Canes.  Walkers.  Scooters.  Crutches.  Turn on the lights when you go into a dark area. Replace any light bulbs as soon as they burn out.  Set up your furniture so you have a clear path. Avoid moving your furniture around.  If any of your floors are uneven, fix them.  If there are any pets around you, be aware of where they are.  Review your medicines with your doctor. Some medicines can make you feel dizzy. This can increase your chance of falling. Ask your doctor what other things that you can do to help prevent falls. This information is not intended to replace advice given to you by your health care provider. Make sure you discuss any questions you have with your health care provider. Document Released: 01/29/2009 Document Revised: 09/10/2015 Document Reviewed: 05/09/2014 Elsevier Interactive Patient Education  2017 [employer].

## 2020-05-19 ENCOUNTER — Encounter: Payer: Self-pay | Admitting: Family Medicine

## 2020-05-19 ENCOUNTER — Ambulatory Visit (INDEPENDENT_AMBULATORY_CARE_PROVIDER_SITE_OTHER): Payer: Medicare Other | Admitting: Family Medicine

## 2020-05-19 ENCOUNTER — Other Ambulatory Visit: Payer: Self-pay

## 2020-05-19 VITALS — BP 126/82 | HR 65 | Temp 98.2°F | Ht 68.0 in | Wt 219.7 lb

## 2020-05-19 DIAGNOSIS — I471 Supraventricular tachycardia: Secondary | ICD-10-CM | POA: Diagnosis not present

## 2020-05-19 DIAGNOSIS — E669 Obesity, unspecified: Secondary | ICD-10-CM | POA: Diagnosis not present

## 2020-05-19 DIAGNOSIS — N5235 Erectile dysfunction following radiation therapy: Secondary | ICD-10-CM

## 2020-05-19 DIAGNOSIS — I10 Essential (primary) hypertension: Secondary | ICD-10-CM | POA: Diagnosis not present

## 2020-05-19 DIAGNOSIS — F320 Major depressive disorder, single episode, mild: Secondary | ICD-10-CM | POA: Diagnosis not present

## 2020-05-19 DIAGNOSIS — G8929 Other chronic pain: Secondary | ICD-10-CM

## 2020-05-19 DIAGNOSIS — M25571 Pain in right ankle and joints of right foot: Secondary | ICD-10-CM | POA: Diagnosis not present

## 2020-05-19 DIAGNOSIS — Z6833 Body mass index (BMI) 33.0-33.9, adult: Secondary | ICD-10-CM | POA: Diagnosis not present

## 2020-05-19 DIAGNOSIS — I480 Paroxysmal atrial fibrillation: Secondary | ICD-10-CM | POA: Diagnosis not present

## 2020-05-19 DIAGNOSIS — Z8546 Personal history of malignant neoplasm of prostate: Secondary | ICD-10-CM

## 2020-05-19 MED ORDER — TRAMADOL HCL 50 MG PO TABS
50.0000 mg | ORAL_TABLET | Freq: Three times a day (TID) | ORAL | 0 refills | Status: AC | PRN
Start: 2020-05-19 — End: 2020-05-24

## 2020-05-19 NOTE — Progress Notes (Signed)
F/u AWV, chronic f/u   Patient: Cameron Foster.   DOB: 1941/01/20   80 y.o. Male  MRN: [id number] Visit Date: 05/19/2020  Today's healthcare provider: Lavon Paganini, MD   Chief Complaint  Patient presents with  . Ankle Pain    Pt has right ankle pain that has been going on for months. Pt has pain when putting pressure on right foot and when walking. Pt denies injury to ankle. Pt says pain is getting worse.   Subjective    Cameron Foster. is a 80 y.o. male who presents today for a chronic disease f/u.    The patient does not participate in regular exercise at present. He generally feels well. He reports sleeping well. He does have additional problems to discuss today.   HPI HPI    Ankle Pain     Additional comments: Pt has right ankle pain that has been going on for months. Pt has pain when putting pressure on right foot and when walking. Pt denies injury to ankle. Pt says pain is getting worse.       Last edited by Sylvester Harder, [place] on 05/19/2020  3:36 PM. (History)      Pt would like to discuss right ankle pain.  Has seen Podiatry and ortho and foot and ankle specialist.  They are not sure what is going on per patient.  Feels unstable and painful to walk on.  Wearing CAM walker boot and walking with a cane.  Past Medical History:  Diagnosis Date  . Alcoholism ([place])   . Arthritis   . Atrial fibrillation ([place])    one episode  . Basal cell carcinoma 04/26/2017   Right medial cheek. Superficial and nodular  . Basal cell carcinoma 06/11/2019   Left anterior shoulder. Nodular pattern  . Basal cell carcinoma 09/09/2019   Right nasal ala, EDC  . Basal cell carcinoma 02/05/2020   L upper eyebrow, EDC   . Colon polyp   . Depression    Son died July 10, 2014  . Diverticulitis   . Diverticulosis 30 years  . Dysrhythmia    At Fib 1995  . GERD (gastroesophageal reflux disease)   . Hyperlipidemia   . Hypertension   . Irritable bowel syndrome   . Prostate cancer ([place])  Jul 10, 2010   treated with radiation therapy. Prostate  . Squamous cell carcinoma of skin 07/18/2017   Left medial calf. KA type  . Squamous cell carcinoma of skin 04/24/2018   Right above med. brow  . Squamous cell carcinoma of skin 06/11/2019   Right posterior shoulder. SCCis, hypertrophic  . Squamous cell carcinoma of skin 01/09/2020   Mid nasal dorsum, MOHS  . Vasovagal syncope    Past Surgical History:  Procedure Laterality Date  . [place] no stents  . CATARACT EXTRACTION, BILATERAL    . COLON RESECTION SIGMOID N/A 12/07/2016   Procedure: COLON RESECTION SIGMOID;  Surgeon: Clayburn Pert, MD;  Location: [facility] ORS;  Service: General;  Laterality: N/A;  . COLON SURGERY  11/2016   Colostomy  . COLONOSCOPY  2014-07-10  . COLONOSCOPY WITH PROPOFOL N/A 05/30/2016   Procedure: COLONOSCOPY WITH PROPOFOL;  Surgeon: Lollie Sails, MD;  Location: [facility] ENDOSCOPY;  Service: Endoscopy;  Laterality: N/A;  . COLOSTOMY Left 12/07/2016   Procedure: COLOSTOMY;  Surgeon: Clayburn Pert, MD;  Location: [facility] ORS;  Service: General;  Laterality: Left;  . COLOSTOMY REVERSAL N/A 03/21/2017   Procedure: COLOSTOMY REVERSAL;  Surgeon: Clayburn Pert, MD;  Location: [facility] ORS;  Service: General;  Laterality: N/A;  . COLOSTOMY TAKEDOWN N/A 03/21/2017   Procedure: LAPAROSCOPIC COLOSTOMY TAKEDOWN;  Surgeon: Clayburn Pert, MD;  Location: [facility] ORS;  Service: General;  Laterality: N/A;  . CYSTOSCOPY WITH STENT PLACEMENT Bilateral 03/21/2017   Procedure: CYSTOSCOPY WITH LIGHTED STENT PLACEMENT;  Surgeon: Abbie Sons, MD;  Location: [facility] ORS;  Service: Urology;  Laterality: Bilateral;  . ESOPHAGOGASTRODUODENOSCOPY    . ESOPHAGOGASTRODUODENOSCOPY N/A 05/30/2016   Procedure: ESOPHAGOGASTRODUODENOSCOPY (EGD);  Surgeon: Lollie Sails, MD;  Location: [facility] ENDOSCOPY;  Service: Endoscopy;  Laterality: N/A;  . EYE SURGERY     cataracts  . FLEXIBLE SIGMOIDOSCOPY N/A 03/21/2017    Procedure: FLEXIBLE SIGMOIDOSCOPY;  Surgeon: Clayburn Pert, MD;  Location: [facility] ORS;  Service: General;  Laterality: N/A;  . FRACTURE SURGERY Bilateral    right arm and left wrist  . INCISION AND DRAINAGE ABSCESS N/A 12/07/2016   Procedure: DRAINAGE  OF INTRA ABDOMINAL ABSCESS;  Surgeon: Clayburn Pert, MD;  Location: [facility] ORS;  Service: General;  Laterality: N/A;  . KNEE ARTHROSCOPY    . LAPAROTOMY N/A 12/07/2016   Procedure: EXPLORATORY LAPAROTOMY;  Surgeon: Clayburn Pert, MD;  Location: [facility] ORS;  Service: General;  Laterality: N/A;  . PROSTATE SURGERY     Microwave therapy  . TONSILLECTOMY    . TOTAL KNEE ARTHROPLASTY Right 07/27/2016   Procedure: TOTAL KNEE ARTHROPLASTY;  Surgeon: [name], MD;  Location: [facility] ORS;  Service: Orthopedics;  Laterality: Right;  Dr. Erlene Quan had to place Urinary catheter due to prostate cancer history.  Using flexible scope.   Social History   Socioeconomic History  . Marital status: Married    Spouse name: Not on file  . Number of children: 2  . Years of education: Not on file  . Highest education level: Bachelor's degree (e.g., BA, AB, BS)  Occupational History  . Occupation: retired Dance movement psychotherapist  Tobacco Use  . Smoking status: Former Smoker    Packs/day: 1.00    Years: 27.00    Pack years: 27.00    Types: Cigarettes    Quit date: 04/18/1988    Years since quitting: 32.1  . Smokeless tobacco: Former Systems developer    Types: Secondary school teacher  . Vaping Use: Never used  Substance and Sexual Activity  . Alcohol use: Yes    Alcohol/week: 7.0 standard drinks    Types: 7 Glasses of wine per week    Comment: Formal heavy use ETOH  . Drug use: No  . Sexual activity: Not Currently    Birth control/protection: None    Comment: Married  Other Topics Concern  . Not on file  Social History Narrative  . Not on file   Social Determinants of Health   Financial Resource Strain: Low Risk   . Difficulty of Paying Living Expenses: Not hard at all   Food Insecurity: No Food Insecurity  . Worried About Charity fundraiser in the Last Year: Never true  . Ran Out of Food in the Last Year: Never true  Transportation Needs: No Transportation Needs  . Lack of Transportation (Medical): No  . Lack of Transportation (Non-Medical): No  Physical Activity: Inactive  . Days of Exercise per Week: 0 days  . Minutes of Exercise per Session: 0 min  Stress: No Stress Concern Present  . Feeling of Stress : Not at all  Social Connections: Socially Integrated  . Frequency of Communication with Friends and Family: More than  three times a week  . Frequency of Social Gatherings with Friends and Family: Twice a week  . Attends Religious Services: More than 4 times per year  . Active Member of Clubs or Organizations: Yes  . Attends Archivist Meetings: More than 4 times per year  . Marital Status: Married  Human resources officer Violence: Not At Risk  . Fear of Current or Ex-Partner: No  . Emotionally Abused: No  . Physically Abused: No  . Sexually Abused: No   Family Status  Relation Name Status  . Father  Deceased  . Mother  Deceased  . Son  Deceased       25-Jun-2014  . Daughter  (Not Specified)  . Neg Hx  (Not Specified)   Family History  Problem Relation Age of Onset  . Lung cancer Father        smoker  . Other Mother   . Sudden death Son        due to "Blood clots"  . Bipolar disorder Son   . Heart disease Son   . Kidney disease Daughter        congenital one small kidney  . Prostate cancer Neg Hx   . Bladder Cancer Neg Hx    Allergies  Allergen Reactions  . Formaldehyde Rash    Patient Care Team: [name], MD as PCP - General (Family Medicine) End, Harrell Gave, MD as Consulting Physician (Cardiology) [name], MD (Orthopedic Surgery) Schnier, Dolores Lory, MD (Vascular Surgery) [name], MD (Dermatology) [name], MD as Consulting Physician (Ophthalmology)   Medications: Outpatient Medications  Prior to Visit  Medication Sig  . amLODipine (NORVASC) 5 MG tablet TAKE 1 TABLET BY MOUTH EVERY DAY  . aspirin 81 MG EC tablet Take 81 mg by mouth daily.   . cetirizine (ZYRTEC) 10 MG tablet Take 1 tablet (10 mg total) by mouth daily.  . Cyanocobalamin (VITAMIN B 12 PO) Take 1,000 mcg by mouth daily.  . furosemide (LASIX) 20 MG tablet TAKE 1 TABLET BY MOUTH EVERY DAY  . hydrocortisone 2.5 % cream APPLY TO AFFECTED AREA TWICE A DAY AS NEEDED FOR RASH  . lisinopril (ZESTRIL) 40 MG tablet TAKE 1 TABLET BY MOUTH EVERY DAY  . meclizine (ANTIVERT) 25 MG tablet Take 25 mg by mouth 2 (two) times daily as needed.  . meloxicam (MOBIC) 15 MG tablet Take 1 tablet (15 mg total) by mouth daily as needed for pain.  . metoprolol succinate (TOPROL-XL) 50 MG 24 hr tablet Take 50 mg by mouth daily. Take with or immediately following a meal.  . Multiple Vitamin (MULTIVITAMIN WITH MINERALS) TABS tablet Take 1 tablet by mouth daily.  Marland Kitchen omeprazole (PRILOSEC) 20 MG capsule TAKE 1 CAPSULE (20 MG TOTAL) BY MOUTH DAILY AS NEEDED (HEARTBURN).  Marland Kitchen sertraline (ZOLOFT) 100 MG tablet TAKE 1 TABLET BY MOUTH EVERY DAY  . atorvastatin (LIPITOR) 40 MG tablet Take 1 tablet (40 mg total) by mouth daily.  . [DISCONTINUED] HYDROcodone-acetaminophen (NORCO/VICODIN) 5-325 MG tablet Take 1 tablet by mouth every 6 (six) hours as needed for moderate pain. (Patient not taking: Reported on 05/19/2020)   No facility-administered medications prior to visit.    Review of Systems  Constitutional: Negative.   HENT: Negative.   Eyes: Negative.   Respiratory: Negative.   Cardiovascular: Negative.   Gastrointestinal: Negative.   Endocrine: Negative.   Genitourinary: Negative.   Musculoskeletal: Negative.   Skin: Negative.   Allergic/Immunologic: Negative.   Neurological: Negative.   Hematological:  Negative.   Psychiatric/Behavioral: Negative.       Objective    BP 126/82 (BP Location: Left Arm, Patient Position: Sitting, Cuff Size:  Large)   Pulse 65   Temp 98.2 F (36.8 C) (Oral)   Ht 5' 8"  (1.727 m)   Wt 219 lb 11.2 oz (99.7 kg)   BMI 33.41 kg/m    Physical Exam Constitutional:      General: He is not in acute distress.    Appearance: Normal appearance. He is not diaphoretic.  HENT:     Head: Normocephalic.     Right Ear: Tympanic membrane, ear canal and external ear normal.     Left Ear: Tympanic membrane, ear canal and external ear normal.  Eyes:     Conjunctiva/sclera: Conjunctivae normal.  Cardiovascular:     Rate and Rhythm: Normal rate and regular rhythm.     Heart sounds: Normal heart sounds.  Pulmonary:     Effort: Pulmonary effort is normal. No respiratory distress.     Breath sounds: Normal breath sounds. No wheezing.  Abdominal:     General: There is no distension.     Palpations: Abdomen is soft.     Tenderness: There is no abdominal tenderness.  Musculoskeletal:     Cervical back: Neck supple.     Right lower leg: No edema.     Left lower leg: No edema.  Lymphadenopathy:     Cervical: No cervical adenopathy.  Neurological:     Mental Status: He is alert and oriented to person, place, and time. Mental status is at baseline.  Psychiatric:        Mood and Affect: Mood normal.        Behavior: Behavior normal.     Last depression screening scores PHQ 2/9 Scores 05/18/2020 01/14/2020 12/02/2019  PHQ - 2 Score 0 0 0  PHQ- 9 Score - 0 2   Last fall risk screening Fall Risk  05/18/2020  Falls in the past year? 1  Comment accidental fall  Number falls in past yr: 0  Comment -  Injury with Fall? 1  Comment broken rib  Risk for fall due to : No Fall Risks  Follow up Falls prevention discussed   Last Audit-C alcohol use screening Alcohol Use Disorder Test (AUDIT) 05/18/2020  1. How often do you have a drink containing alcohol? 4  2. How many drinks containing alcohol do you have on a typical day when you are drinking? 0  3. How often do you have six or more drinks on one occasion? 0   AUDIT-C Score 4  4. How often during the last year have you found that you were not able to stop drinking once you had started? 0  5. How often during the last year have you failed to do what was normally expected from you because of drinking? 0  6. How often during the last year have you needed a first drink in the morning to get yourself going after a heavy drinking session? 0  7. How often during the last year have you had a feeling of guilt of remorse after drinking? 0  8. How often during the last year have you been unable to remember what happened the night before because you had been drinking? 0  9. Have you or someone else been injured as a result of your drinking? 0  10. Has a relative or friend or a doctor or another health worker been concerned about your drinking or  suggested you cut down? 0  Alcohol Use Disorder Identification Test Final Score (AUDIT) 4  Alcohol Brief Interventions/Follow-up AUDIT Score <7 follow-up not indicated   A score of 3 or more in women, and 4 or more in men indicates increased risk for alcohol abuse, EXCEPT if all of the points are from question 1   No results found for any visits on 05/19/20.  Assessment & Plan    Routine Health Maintenance and Physical Exam  Exercise Activities and Dietary recommendations Goals    . DIET - INCREASE WATER INTAKE     Recommend to drink at least 6-8 8oz glasses of water per day.       Immunization History  Administered Date(s) Administered  . Fluad Quad(high Dose 65+) 01/14/2020  . Influenza, High Dose Seasonal PF 01/26/2017, 02/01/2018, 01/31/2019  . Influenza,inj,Quad PF,6+ Mos 02/08/2016  . Influenza,inj,quad, With Preservative 12/18/2014  . PFIZER(Purple Top)SARS-COV-2 Vaccination 05/31/2019, 06/25/2019, 01/22/2020  . Pneumococcal Conjugate-13 05/14/2018  . Pneumococcal Polysaccharide-23 12/18/2014  . Pneumococcal-Unspecified 12/18/2014  . Tdap 01/14/2020    Health Maintenance  Topic Date Due  .  COLONOSCOPY (Pts 45-85yr Insurance coverage will need to be confirmed)  05/31/2019  . COVID-19 Vaccine (4 - Booster for Pfizer series) 07/22/2020  . TETANUS/TDAP  01/13/2030  . INFLUENZA VACCINE  Completed  . PNA vac Low Risk Adult  Completed    Discussed health benefits of physical activity, and encouraged him to engage in regular exercise appropriate for his age and condition.  Problem List Items Addressed This Visit      Cardiovascular and Mediastinum   Essential hypertension - Primary    Well controlled Continue current medications Recheck metabolic panel F/u in 6 months       Paroxysmal atrial fibrillation (HCC)    Chronic and stable Followed by cardiology Continue ASA and BB      PSVT (paroxysmal [place]ventricular tachycardia) ([place]    Continue BB Followed by Cardiology        Other   History of prostate cancer    Asymptomatic No longer seeing urology Get PSA with next labs      Erectile dysfunction following radiation therapy    Patient is disappointed by this Has tried PDE5i in the past with Urology without relief      Depression, major, single episode, mild [place]    Chronic and stable In remission cotninue Zoloft - patient prefers not to taper at this time      Obesity    Discussed importance of healthy weight management Discussed diet and exercise       Chronic pain of right ankle    Encouraged patient to continue to work with Ortho for eval and management ? Whether he would benefit from PT 5 d supply of tramadol given, but discussed this will not be a chronic prescription      Relevant Medications   traMADol (ULTRAM) 50 MG tablet       Return in about 6 months (around 11/16/2020) for chronic disease f/u.     I, ALavon Paganini MD, have reviewed all documentation for this visit. The documentation on 05/21/20 for the exam, diagnosis, procedures, and orders are all accurate and complete.   Graylen Noboa, ADionne Bucy MD, MPH [facility]

## 2020-05-19 NOTE — Assessment & Plan Note (Signed)
Well controlled Continue current medications Recheck metabolic panel F/u in 6 months  

## 2020-05-21 DIAGNOSIS — Z433 Encounter for attention to colostomy: Secondary | ICD-10-CM | POA: Insufficient documentation

## 2020-05-21 DIAGNOSIS — G8929 Other chronic pain: Secondary | ICD-10-CM | POA: Insufficient documentation

## 2020-05-21 NOTE — Assessment & Plan Note (Signed)
Asymptomatic No longer seeing urology Get PSA with next labs

## 2020-05-21 NOTE — Assessment & Plan Note (Signed)
Continue BB Followed by Cardiology

## 2020-05-21 NOTE — Assessment & Plan Note (Signed)
Encouraged patient to continue to work with Ortho for eval and management ? Whether he would benefit from PT 5 d supply of tramadol given, but discussed this will not be a chronic prescription

## 2020-05-21 NOTE — Assessment & Plan Note (Signed)
Chronic and stable In remission cotninue Zoloft - patient prefers not to taper at this time

## 2020-05-21 NOTE — Assessment & Plan Note (Signed)
Patient is disappointed by this Has tried PDE5i in the past with Urology without relief

## 2020-05-21 NOTE — Assessment & Plan Note (Signed)
Discussed importance of healthy weight management Discussed diet and exercise  

## 2020-05-21 NOTE — Assessment & Plan Note (Signed)
Chronic and stable Followed by cardiology Continue ASA and BB

## 2020-05-22 DIAGNOSIS — M533 Sacrococcygeal disorders, not elsewhere classified: Secondary | ICD-10-CM | POA: Diagnosis not present

## 2020-05-27 ENCOUNTER — Ambulatory Visit: Payer: Medicare Other | Admitting: Internal Medicine

## 2020-05-27 NOTE — Progress Notes (Deleted)
Follow-up Outpatient Visit Date: 05/27/2020  Primary Care Provider: [name], MD [id number] [id number]  Chief Complaint: ***  HPI:  Mr. Cameron Foster is a 80 y.o. male with history of hypertension, hyperlipidemia, single episode of atrial fibrillation in 06/18/93, subarachnoid hemorrhage of uncertain etiology complicated by more recent stroke, prostate cancer, and alcohol abuse, who presents for follow-up of elevated blood pressure.  I last saw him in late October, at which time he noted significantly elevated blood pressure readings of up to 190 mmHg at home.  He was asymptomatic.  BP in the office was normal at 124/80.  We agreed to defer medication changes.  He was seen in the ED in late November after falling and injuring her rib.  --------------------------------------------------------------------------------------------------  Past Medical History:  Diagnosis Date  . Alcoholism ([place])   . Arthritis   . Atrial fibrillation ([place])    one episode  . Basal cell carcinoma 04/26/2017   Right medial cheek. Superficial and nodular  . Basal cell carcinoma 06/11/2019   Left anterior shoulder. Nodular pattern  . Basal cell carcinoma 09/09/2019   Right nasal ala, EDC  . Basal cell carcinoma 02/05/2020   L upper eyebrow, EDC   . Colon polyp   . Depression    Son died 06/18/2014  . Diverticulitis   . Diverticulosis 30 years  . Dysrhythmia    At Fib 1995  . GERD (gastroesophageal reflux disease)   . Hyperlipidemia   . Hypertension   . Irritable bowel syndrome   . Prostate cancer ([place]) 06/18/2010   treated with radiation therapy. Prostate  . Squamous cell carcinoma of skin 07/18/2017   Left medial calf. KA type  . Squamous cell carcinoma of skin 04/24/2018   Right above med. brow  . Squamous cell carcinoma of skin 06/11/2019   Right posterior shoulder. SCCis, hypertrophic  . Squamous cell carcinoma of skin 01/09/2020   Mid nasal dorsum, MOHS  . Vasovagal syncope     Past Surgical History:  Procedure Laterality Date  . [place] no stents  . CATARACT EXTRACTION, BILATERAL    . COLON RESECTION SIGMOID N/A 12/07/2016   Procedure: COLON RESECTION SIGMOID;  Surgeon: Clayburn Pert, MD;  Location: [facility] ORS;  Service: General;  Laterality: N/A;  . COLON SURGERY  11/2016   Colostomy  . COLONOSCOPY  06-18-14  . COLONOSCOPY WITH PROPOFOL N/A 05/30/2016   Procedure: COLONOSCOPY WITH PROPOFOL;  Surgeon: Lollie Sails, MD;  Location: [facility] ENDOSCOPY;  Service: Endoscopy;  Laterality: N/A;  . COLOSTOMY Left 12/07/2016   Procedure: COLOSTOMY;  Surgeon: Clayburn Pert, MD;  Location: [facility] ORS;  Service: General;  Laterality: Left;  . COLOSTOMY REVERSAL N/A 03/21/2017   Procedure: COLOSTOMY REVERSAL;  Surgeon: Clayburn Pert, MD;  Location: [facility] ORS;  Service: General;  Laterality: N/A;  . COLOSTOMY TAKEDOWN N/A 03/21/2017   Procedure: LAPAROSCOPIC COLOSTOMY TAKEDOWN;  Surgeon: Clayburn Pert, MD;  Location: [facility] ORS;  Service: General;  Laterality: N/A;  . CYSTOSCOPY WITH STENT PLACEMENT Bilateral 03/21/2017   Procedure: CYSTOSCOPY WITH LIGHTED STENT PLACEMENT;  Surgeon: Abbie Sons, MD;  Location: [facility] ORS;  Service: Urology;  Laterality: Bilateral;  . ESOPHAGOGASTRODUODENOSCOPY    . ESOPHAGOGASTRODUODENOSCOPY N/A 05/30/2016   Procedure: ESOPHAGOGASTRODUODENOSCOPY (EGD);  Surgeon: Lollie Sails, MD;  Location: [facility] ENDOSCOPY;  Service: Endoscopy;  Laterality: N/A;  . EYE SURGERY     cataracts  . FLEXIBLE SIGMOIDOSCOPY N/A 03/21/2017   Procedure: FLEXIBLE SIGMOIDOSCOPY;  Surgeon: Clayburn Pert, MD;  Location: [facility] ORS;  Service: General;  Laterality: N/A;  . FRACTURE SURGERY Bilateral    right arm and left wrist  . INCISION AND DRAINAGE ABSCESS N/A 12/07/2016   Procedure: DRAINAGE  OF INTRA ABDOMINAL ABSCESS;  Surgeon: Clayburn Pert, MD;  Location: [facility] ORS;  Service: General;  Laterality: N/A;  . KNEE  ARTHROSCOPY    . LAPAROTOMY N/A 12/07/2016   Procedure: EXPLORATORY LAPAROTOMY;  Surgeon: Clayburn Pert, MD;  Location: [facility] ORS;  Service: General;  Laterality: N/A;  . PROSTATE SURGERY     Microwave therapy  . TONSILLECTOMY    . TOTAL KNEE ARTHROPLASTY Right 07/27/2016   Procedure: TOTAL KNEE ARTHROPLASTY;  Surgeon: [name], MD;  Location: [facility] ORS;  Service: Orthopedics;  Laterality: Right;  Dr. Erlene Quan had to place Urinary catheter due to prostate cancer history.  Using flexible scope.    No outpatient medications have been marked as taking for the 05/27/20 encounter (Appointment) with Cameron Foster, Cameron Gave, MD.    Allergies: Formaldehyde  Social History   Tobacco Use  . Smoking status: Former Smoker    Packs/day: 1.00    Years: 27.00    Pack years: 27.00    Types: Cigarettes    Quit date: 04/18/1988    Years since quitting: 32.1  . Smokeless tobacco: Former Systems developer    Types: Secondary school teacher  . Vaping Use: Never used  Substance Use Topics  . Alcohol use: Yes    Alcohol/week: 7.0 standard drinks    Types: 7 Glasses of wine per week    Comment: Formal heavy use ETOH  . Drug use: No    Family History  Problem Relation Age of Onset  . Lung cancer Father        smoker  . Other Mother   . Sudden death Son        due to "Blood clots"  . Bipolar disorder Son   . Heart disease Son   . Kidney disease Daughter        congenital one small kidney  . Prostate cancer Neg Hx   . Bladder Cancer Neg Hx     Review of Systems: A 12-system review of systems was performed and was negative except as noted in the HPI.  --------------------------------------------------------------------------------------------------  Physical Exam: There were no vitals taken for this visit.  General:  NAD. Neck: No JVD or HJR. Lungs: Clear to auscultation bilaterally without wheezes or crackles. Heart: Regular rate and rhythm without murmurs, rubs, or gallops. Abdomen: Soft, nontender,  nondistended. Extremities: No lower extremity edema.  EKG:  ***  Lab Results  Component Value Date   WBC 8.5 03/14/2020   HGB 11.7 (L) 03/14/2020   HCT 36.3 (L) 03/14/2020   MCV 90.1 03/14/2020   PLT 199 03/14/2020    Lab Results  Component Value Date   NA 137 03/14/2020   K 3.6 03/14/2020   CL 102 03/14/2020   CO2 25 03/14/2020   BUN 14 03/14/2020   CREATININE 1.40 (H) 03/14/2020   GLUCOSE 118 (H) 03/14/2020   ALT 15 06/08/2019    Lab Results  Component Value Date   CHOL 156 08/15/2019   HDL 46 08/15/2019   LDLCALC 85 08/15/2019   TRIG 144 08/15/2019   CHOLHDL 4.4 06/09/2019    --------------------------------------------------------------------------------------------------  ASSESSMENT AND PLAN: Cameron Foster Vivian Neuwirth, MD 05/27/2020 10:51 AM

## 2020-05-29 DIAGNOSIS — Z1211 Encounter for screening for malignant neoplasm of colon: Secondary | ICD-10-CM | POA: Diagnosis not present

## 2020-06-02 DIAGNOSIS — M24871 Other specific joint derangements of right ankle, not elsewhere classified: Secondary | ICD-10-CM | POA: Diagnosis not present

## 2020-06-02 DIAGNOSIS — M7661 Achilles tendinitis, right leg: Secondary | ICD-10-CM | POA: Diagnosis not present

## 2020-06-02 DIAGNOSIS — M19071 Primary osteoarthritis, right ankle and foot: Secondary | ICD-10-CM | POA: Diagnosis not present

## 2020-06-02 DIAGNOSIS — M2141 Flat foot [pes planus] (acquired), right foot: Secondary | ICD-10-CM | POA: Diagnosis not present

## 2020-06-02 DIAGNOSIS — M722 Plantar fascial fibromatosis: Secondary | ICD-10-CM | POA: Diagnosis not present

## 2020-06-02 DIAGNOSIS — M7731 Calcaneal spur, right foot: Secondary | ICD-10-CM | POA: Diagnosis not present

## 2020-06-05 ENCOUNTER — Ambulatory Visit (INDEPENDENT_AMBULATORY_CARE_PROVIDER_SITE_OTHER): Payer: Medicare Other | Admitting: Nurse Practitioner

## 2020-06-05 ENCOUNTER — Encounter: Payer: Self-pay | Admitting: Nurse Practitioner

## 2020-06-05 ENCOUNTER — Other Ambulatory Visit: Payer: Self-pay

## 2020-06-05 VITALS — BP 122/82 | HR 70 | Ht 68.0 in | Wt 200.0 lb

## 2020-06-05 DIAGNOSIS — I480 Paroxysmal atrial fibrillation: Secondary | ICD-10-CM

## 2020-06-05 DIAGNOSIS — I10 Essential (primary) hypertension: Secondary | ICD-10-CM | POA: Diagnosis not present

## 2020-06-05 NOTE — Patient Instructions (Signed)
Medication Instructions:  Your physician recommends that you continue on your current medications as directed. Please refer to the Current Medication list given to you today.  *If you need a refill on your cardiac medications before your next appointment, please call your pharmacy*   Lab Work: None ordered   Testing/Procedures: None ordred   Follow-Up: At [facility], you and your health needs are our priority.  As part of our continuing mission to provide you with exceptional heart care, we have created designated Provider Care Teams.  These Care Teams include your primary Cardiologist (physician) and Advanced Practice Providers (APPs -  Physician Assistants and Nurse Practitioners) who all work together to provide you with the care you need, when you need it.  We recommend signing up for the patient portal called "MyChart".  Sign up information is provided on this After Visit Summary.  MyChart is used to connect with patients for Virtual Visits (Telemedicine).  Patients are able to view lab/test results, encounter notes, upcoming appointments, etc.  Non-urgent messages can be sent to your provider as well.   To learn more about what you can do with MyChart, go to [email or web address].    Your next appointment:   6 month(s)  The format for your next appointment:   In Person  Provider:   You may see [name], MD or one of the following Advanced Practice Providers on your designated Care Team:    Murray Hodgkins, NP

## 2020-06-05 NOTE — Progress Notes (Signed)
Office Visit    Patient Name: Cameron Foster. Date of Encounter: 06/05/2020  Primary Care Provider:  [name], MD Primary Cardiologist:  [name], MD  Chief Complaint    80 year old male with a history of hypertension, hyperlipidemia solitary episode of atrial fibrillation 1993-06-21, subarachnoid hemorrhage of uncertain etiology complicated by more recent stroke, prostate cancer, and alcohol abuse, who presents for follow-up related to HTN.  Past Medical History    Past Medical History:  Diagnosis Date  . Alcoholism ([place])   . Arthritis   . Atrial fibrillation ([place]) 1995   one episode  . Basal cell carcinoma 04/26/2017   Right medial cheek. Superficial and nodular  . Basal cell carcinoma 06/11/2019   Left anterior shoulder. Nodular pattern  . Basal cell carcinoma 09/09/2019   Right nasal ala, EDC  . Basal cell carcinoma 02/05/2020   L upper eyebrow, EDC   . Carotid arterial disease ([place])    a. 06-22-19 Carotid U/S: <50% bilat ICA stenoses.  . Colon polyp   . Depression    Son died Jun 21, 2014  . Diverticulitis   . Diverticulosis 30 years  . GERD (gastroesophageal reflux disease)   . History of echocardiogram    a. 06-22-19 Echo: EF 55-60%, no rwma, triv MR/AI.  Marland Kitchen History of stress test    a. 09/2015 MV: No ischemia/infarct. EF 45-54% (nl by echo).  . Hyperlipidemia   . Hypertension   . Irritable bowel syndrome   . Prostate cancer ([place]) 2010-06-21   treated with radiation therapy. Prostate  . PSVT (paroxysmal supraventricular tachycardia) ([place])    a. 06/2019 Zio: Avg rate 74 (54-120), occas PACs, rare PVCs, 125 episodes of PVCs (longest 17.5 secs; max rate 187). No afib.  Marland Kitchen Squamous cell carcinoma of skin 07/18/2017   Left medial calf. KA type  . Squamous cell carcinoma of skin 04/24/2018   Right above med. brow  . Squamous cell carcinoma of skin 06/11/2019   Right posterior shoulder. SCCis, hypertrophic  . Squamous cell carcinoma of skin 01/09/2020   Mid nasal  dorsum, MOHS  . Vasovagal syncope    Past Surgical History:  Procedure Laterality Date  . [place] no stents  . CATARACT EXTRACTION, BILATERAL    . COLON RESECTION SIGMOID N/A 12/07/2016   Procedure: COLON RESECTION SIGMOID;  Surgeon: Clayburn Pert, MD;  Location: [facility] ORS;  Service: General;  Laterality: N/A;  . COLON SURGERY  11/2016   Colostomy  . COLONOSCOPY  2014-06-21  . COLONOSCOPY WITH PROPOFOL N/A 05/30/2016   Procedure: COLONOSCOPY WITH PROPOFOL;  Surgeon: Lollie Sails, MD;  Location: [facility] ENDOSCOPY;  Service: Endoscopy;  Laterality: N/A;  . COLOSTOMY Left 12/07/2016   Procedure: COLOSTOMY;  Surgeon: Clayburn Pert, MD;  Location: [facility] ORS;  Service: General;  Laterality: Left;  . COLOSTOMY REVERSAL N/A 03/21/2017   Procedure: COLOSTOMY REVERSAL;  Surgeon: Clayburn Pert, MD;  Location: [facility] ORS;  Service: General;  Laterality: N/A;  . COLOSTOMY TAKEDOWN N/A 03/21/2017   Procedure: LAPAROSCOPIC COLOSTOMY TAKEDOWN;  Surgeon: Clayburn Pert, MD;  Location: [facility] ORS;  Service: General;  Laterality: N/A;  . CYSTOSCOPY WITH STENT PLACEMENT Bilateral 03/21/2017   Procedure: CYSTOSCOPY WITH LIGHTED STENT PLACEMENT;  Surgeon: Abbie Sons, MD;  Location: [facility] ORS;  Service: Urology;  Laterality: Bilateral;  . ESOPHAGOGASTRODUODENOSCOPY    . ESOPHAGOGASTRODUODENOSCOPY N/A 05/30/2016   Procedure: ESOPHAGOGASTRODUODENOSCOPY (EGD);  Surgeon: Lollie Sails, MD;  Location: [facility] ENDOSCOPY;  Service: Endoscopy;  Laterality: N/A;  . EYE SURGERY     cataracts  . FLEXIBLE SIGMOIDOSCOPY N/A 03/21/2017   Procedure: FLEXIBLE SIGMOIDOSCOPY;  Surgeon: Clayburn Pert, MD;  Location: [facility] ORS;  Service: General;  Laterality: N/A;  . FRACTURE SURGERY Bilateral    right arm and left wrist  . INCISION AND DRAINAGE ABSCESS N/A 12/07/2016   Procedure: DRAINAGE  OF INTRA ABDOMINAL ABSCESS;  Surgeon: Clayburn Pert, MD;  Location: [facility] ORS;  Service: General;   Laterality: N/A;  . KNEE ARTHROSCOPY    . LAPAROTOMY N/A 12/07/2016   Procedure: EXPLORATORY LAPAROTOMY;  Surgeon: Clayburn Pert, MD;  Location: [facility] ORS;  Service: General;  Laterality: N/A;  . PROSTATE SURGERY     Microwave therapy  . TONSILLECTOMY    . TOTAL KNEE ARTHROPLASTY Right 07/27/2016   Procedure: TOTAL KNEE ARTHROPLASTY;  Surgeon: [name], MD;  Location: [facility] ORS;  Service: Orthopedics;  Laterality: Right;  Dr. Erlene Quan had to place Urinary catheter due to prostate cancer history.  Using flexible scope.    Allergies  Allergies  Allergen Reactions  . Formaldehyde Rash    History of Present Illness    80 year old male with the above complex past medical history including hypertension, hyperlipidemia, solitary episode of atrial fibrillation in 1997, subarachnoid hemorrhage of uncertain etiology complicated by more recent stroke, prostate cancer, lymphedema, and alcohol abuse.  He also has a history of chronic, stable exertional angina times several years and underwent stress testing in June 2017 which showed no evidence of ischemia or infarct.  In 2020, he presented to the [facility] emergency department with facial pain and double vision.  He was found to have subarachnoid hemorrhage and was transferred to [facility] for further management but did not require any intervention.  In February 2021, he was admitted to [facility] regional with left arm weakness, numbness, and reduced grip strength and was found to have a small acute lacunar infarct.  Echocardiogram showed an EF of 55-60% without regional wall motion abnormalities, normal RV function, and trivial mitral regurgitation.  Carotid ultrasound showed moderate bilateral carotid disease.  Neurology felt the stroke was most likely embolic in origin however given prior history of subarachnoid hemorrhage, he was not placed on oral anticoagulation.  He wore a 14-day Zio monitor in March 2021, that showed predominantly sinus rhythm  at a rate of 74 with occasional PVCs and 225 episodes of PSVT lasting up to 17-1/2 seconds (maximum heart rate 187).  He did not have any sustained arrhythmias or atrial fibrillation.  He was last seen in clinic in October 2021, at which time he reported elevated blood pressure readings at home but blood pressure in the office was normal.  He reported fatigue and was offered stress testing but deferred.  Since then, he has done well from a cardiac standpoint.  He has not been having any chest pain, dyspnea, or fatigue.  His blood pressure has been well controlled when he checks it at home.  He denies palpitations, PND, orthopnea, dizziness, syncope, edema, or early satiety.  He has bothered by right ankle pain and instability for which she has seen Ortho and is currently wearing a boot.  He just had an MRI yesterday and is awaiting Ortho follow-up.  Home Medications    Prior to Admission medications   Medication Sig Start Date End Date Taking? Authorizing Provider  amLODipine (NORVASC) 5 MG tablet TAKE 1 TABLET BY MOUTH EVERY DAY 05/14/20   [name], MD  aspirin 81 MG EC tablet  Take 81 mg by mouth daily.     [provider]  atorvastatin (LIPITOR) 40 MG tablet Take 1 tablet (40 mg total) by mouth daily. 09/06/19 02/13/20  [name], MD  cetirizine (ZYRTEC) 10 MG tablet Take 1 tablet (10 mg total) by mouth daily. 12/02/19   [name], MD  Cyanocobalamin (VITAMIN B 12 PO) Take 1,000 mcg by mouth daily.    [provider]  furosemide (LASIX) 20 MG tablet TAKE 1 TABLET BY MOUTH EVERY DAY 04/13/20   End, Harrell Gave, MD  hydrocortisone 2.5 % cream APPLY TO AFFECTED AREA TWICE A DAY AS NEEDED FOR RASH 09/03/19   Bacigalupo, Dionne Bucy, MD  lisinopril (ZESTRIL) 40 MG tablet TAKE 1 TABLET BY MOUTH EVERY DAY 05/14/20   [name], MD  meclizine (ANTIVERT) 25 MG tablet Take 25 mg by mouth 2 (two) times daily as needed. 05/03/19   [provider]   meloxicam (MOBIC) 15 MG tablet Take 1 tablet (15 mg total) by mouth daily as needed for pain. 06/10/19   Enzo Bi, MD  metoprolol succinate (TOPROL-XL) 50 MG 24 hr tablet Take 50 mg by mouth daily. Take with or immediately following a meal.    [provider]  Multiple Vitamin (MULTIVITAMIN WITH MINERALS) TABS tablet Take 1 tablet by mouth daily. 10/24/18   Vaughan Basta, MD  omeprazole (PRILOSEC) 20 MG capsule TAKE 1 CAPSULE (20 MG TOTAL) BY MOUTH DAILY AS NEEDED (HEARTBURN). 04/13/20   [name], MD  sertraline (ZOLOFT) 100 MG tablet TAKE 1 TABLET BY MOUTH EVERY DAY 05/14/20   Bacigalupo, Dionne Bucy, MD    Review of Systems    Doing well since his last visit.  He denies chest pain, dyspnea, palpitations, fatigue, dizziness, syncope, edema, or early satiety.  All other systems reviewed and are otherwise negative except as noted above.  Physical Exam    VS:  BP 122/82 (BP Location: Left Arm, Patient Position: Sitting, Cuff Size: Normal)   Pulse 70   Ht 5' 8"  (1.727 m)   Wt 200 lb (90.7 kg)   SpO2 98%   BMI 30.41 kg/m  , BMI Body mass index is 30.41 kg/m. GEN: Well nourished, well developed, in no acute distress. HEENT: normal. Neck: Supple, no JVD, carotid bruits, or masses. Cardiac: RRR, no murmurs, rubs, or gallops. No clubbing, cyanosis, edema.  Radials 2+/PT 1+ and equal bilaterally.  Respiratory:  Respirations regular and unlabored, clear to auscultation bilaterally. GI: Soft, nontender, nondistended, BS + x 4. MS: no deformity or atrophy. Skin: warm and dry, no rash. Neuro:  Strength and sensation are intact. Psych: Normal affect.  Accessory Clinical Findings    ECG personally reviewed by me today -regular sinus rhythm, 70, left axis deviation, left anterior fascicular block, right bundle branch block (bifascicular block)- no acute changes.  Lab Results  Component Value Date   WBC 8.5 03/14/2020   HGB 11.7 (L) 03/14/2020   HCT 36.3 (L)  03/14/2020   MCV 90.1 03/14/2020   PLT 199 03/14/2020   Lab Results  Component Value Date   CREATININE 1.40 (H) 03/14/2020   BUN 14 03/14/2020   NA 137 03/14/2020   K 3.6 03/14/2020   CL 102 03/14/2020   CO2 25 03/14/2020   Lab Results  Component Value Date   ALT 15 06/08/2019   AST 27 06/08/2019   ALKPHOS 75 06/08/2019   BILITOT 0.8 06/08/2019   Lab Results  Component Value Date   CHOL  156 08/15/2019   HDL 46 08/15/2019   LDLCALC 85 08/15/2019   TRIG 144 08/15/2019   CHOLHDL 4.4 06/09/2019    Lab Results  Component Value Date   HGBA1C 5.6 06/09/2019    Assessment & Plan    1.  Essential hypertension: Well-controlled on amlodipine, Lasix, lisinopril, and metoprolol therapy.  No changes today.  2.  Fatigue: Patient reported this previously in October 2021 was offered stress testing but wished to defer.  He has not been having significant issues with fatigue and denies chest pain or dyspnea.  Continue medical therapy.  3.  Paroxysmal atrial fibrillation: No known recurrence with his only documented episode occurring greater than 10 years ago.  In the setting of unprovoked subarachnoid hemorrhage, will continue to defer oral anticoagulation.  4.  Right lower extremity edema: Chronic and followed by vascular surgery.  Currently wearing a boot in the setting of ankle pain and instability for which she is seeing Ortho.  5.  Disposition: Follow-up in 6 months or sooner if necessary.   Murray Hodgkins, NP 06/05/2020, 7:00 PM

## 2020-06-06 LAB — COLOGUARD: Cologuard: NEGATIVE

## 2020-06-08 ENCOUNTER — Telehealth: Payer: Self-pay

## 2020-06-08 NOTE — Telephone Encounter (Signed)
-----   Message from [name], MD sent at 06/08/2020  4:13 PM EST ----- Negative Cologuard.

## 2020-06-08 NOTE — Telephone Encounter (Signed)
Pt called and verbalized understanding of information below. 

## 2020-06-11 DIAGNOSIS — M65871 Other synovitis and tenosynovitis, right ankle and foot: Secondary | ICD-10-CM | POA: Diagnosis not present

## 2020-06-11 DIAGNOSIS — M7661 Achilles tendinitis, right leg: Secondary | ICD-10-CM | POA: Diagnosis not present

## 2020-06-17 DIAGNOSIS — M25571 Pain in right ankle and joints of right foot: Secondary | ICD-10-CM | POA: Diagnosis not present

## 2020-07-22 ENCOUNTER — Other Ambulatory Visit: Payer: Self-pay | Admitting: Family Medicine

## 2020-07-22 DIAGNOSIS — I10 Essential (primary) hypertension: Secondary | ICD-10-CM

## 2020-08-13 ENCOUNTER — Other Ambulatory Visit: Payer: Self-pay | Admitting: *Deleted

## 2020-08-13 DIAGNOSIS — Z8546 Personal history of malignant neoplasm of prostate: Secondary | ICD-10-CM

## 2020-08-17 ENCOUNTER — Encounter: Payer: Self-pay | Admitting: Dermatology

## 2020-08-17 ENCOUNTER — Other Ambulatory Visit: Payer: Self-pay

## 2020-08-17 ENCOUNTER — Ambulatory Visit (INDEPENDENT_AMBULATORY_CARE_PROVIDER_SITE_OTHER): Payer: Medicare Other | Admitting: Dermatology

## 2020-08-17 DIAGNOSIS — Z85828 Personal history of other malignant neoplasm of skin: Secondary | ICD-10-CM | POA: Diagnosis not present

## 2020-08-17 DIAGNOSIS — L578 Other skin changes due to chronic exposure to nonionizing radiation: Secondary | ICD-10-CM | POA: Diagnosis not present

## 2020-08-17 DIAGNOSIS — L570 Actinic keratosis: Secondary | ICD-10-CM

## 2020-08-17 NOTE — Patient Instructions (Signed)

## 2020-08-17 NOTE — Progress Notes (Signed)
Follow-Up Visit   Subjective  Cameron Foster. is a 80 y.o. male who presents for the following: hx of SCC (Mid nasal dorsum, 52mf/u MOHs surgery and Efudex x 4wks) and Hx of BCC (L upper eyebrow, 34m/u EDC at last visit).  No new or concerning spots, other than a scaly spot on L cheek.   The following portions of the chart were reviewed this encounter and updated as appropriate:       Review of Systems:  No other skin or systemic complaints except as noted in HPI or Assessment and Plan.  Objective  Well appearing patient in no apparent distress; mood and affect are within normal limits.  A focused examination was performed including face, neck, bil arms/hands. Relevant physical exam findings are noted in the Assessment and Plan.  Objective  L upper eyebrow: Well healed scar with no evidence of recurrence.   Objective  L malar cheek x 1, forehead x 6, L eyebrow x 2, L ear helix x 1, R ear helix x 1, L post auricular neck x 2, L preauricular x 2, L hand x 3, L forearm x 1, R hand x 2, R forearm x 1 (22): Pink scaly macules   Objective  mid nasal dorsum: Well healed slightly hypertrophic scar with no evidence of recurrence    Assessment & Plan  History of basal cell carcinoma (BCC) L upper eyebrow  Clear. Observe for recurrence. Call clinic for new or changing lesions.  Recommend regular skin exams, daily broad-spectrum spf 30+ sunscreen use, and photoprotection.     AK (actinic keratosis) (22) L malar cheek x 1, forehead x 6, L eyebrow x 2, L ear helix x 1, R ear helix x 1, L post auricular neck x 2, L preauricular x 2, L hand x 3, L forearm x 1, R hand x 2, R forearm x 1  Actinic keratoses are precancerous spots that appear secondary to cumulative UV radiation exposure/sun exposure over time. They are chronic with expected duration over 1 year. A portion of actinic keratoses will progress to squamous cell carcinoma of the skin. It is not possible to reliably predict which  spots will progress to skin cancer and so treatment is recommended to prevent development of skin cancer.  Recommend daily broad spectrum sunscreen SPF 30+ to sun-exposed areas, reapply every 2 hours as needed.  Recommend staying in the shade or wearing long sleeves, sun glasses (UVA+UVB protection) and wide brim hats (4-inch brim around the entire circumference of the hat). Call for new or changing lesions.   Destruction of lesion - L malar cheek x 1, forehead x 6, L eyebrow x 2, L ear helix x 1, R ear helix x 1, L post auricular neck x 2, L preauricular x 2, L hand x 3, L forearm x 1, R hand x 2, R forearm x 1  Destruction method: cryotherapy   Informed consent: discussed and consent obtained   Lesion destroyed using liquid nitrogen: Yes   Region frozen until ice ball extended beyond lesion: Yes   Outcome: patient tolerated procedure well with no complications   Post-procedure details: wound care instructions given   Additional details:  Prior to procedure, discussed risks of blister formation, small wound, skin dyspigmentation, or rare scar following cryotherapy.     History of SCC (squamous cell carcinoma) of skin mid nasal dorsum  Status post MOHS 01/09/20 for poorly differentiated SCC, and Efudex x 4wks 11/21  Clear. Observe for  recurrence. Call clinic for new or changing lesions.  Recommend regular skin exams, daily broad-spectrum spf 30+ sunscreen use, and photoprotection.      Actinic Damage - chronic, secondary to cumulative UV radiation exposure/sun exposure over time - diffuse scaly erythematous macules with underlying dyspigmentation - Recommend daily broad spectrum sunscreen SPF 30+ to sun-exposed areas, reapply every 2 hours as needed.  - Recommend staying in the shade or wearing long sleeves, sun glasses (UVA+UVB protection) and wide brim hats (4-inch brim around the entire circumference of the hat). - Call for new or changing lesions.  Return in about 6 months (around  02/17/2021) for UBSE, Hx of BCC, Hx of SCC, Hx of AKs.  I, Othelia Pulling, RMA, am acting as scribe for [name], MD . Documentation: I have reviewed the above documentation for accuracy and completeness, and I agree with the above.  [name] MD

## 2020-08-18 ENCOUNTER — Other Ambulatory Visit: Payer: Self-pay | Admitting: Family Medicine

## 2020-08-18 NOTE — Telephone Encounter (Signed)
Requested medication (s) are due for refill today: Yes  Requested medication (s) are on the active medication list: Yes  Last refill:  02/10/20  Future visit scheduled: Yes  Notes to clinic:  Historical provider.    Requested Prescriptions  Pending Prescriptions Disp Refills   metoprolol succinate (TOPROL-XL) 50 MG 24 hr tablet [Pharmacy Med Name: METOPROLOL SUCC ER 50 MG TAB] 90 tablet 1    Sig: TAKE 1 TABLET BY MOUTH DAILY. TAKE WITH OR IMMEDIATELY FOLLOWING A MEAL.      Cardiovascular:  Beta Blockers Failed - 08/18/2020  2:57 PM      Failed - Valid encounter within last 6 months    Recent Outpatient Visits           3 months ago Essential hypertension   [facility] [place], Dionne Bucy, MD   6 months ago Essential hypertension   [facility] [place], Dionne Bucy, MD   7 months ago Swelling of right lower extremity   [facility], Dionne Bucy, MD   8 months ago Essential hypertension   [place], Dionne Bucy, MD   1 year ago Mixed hyperlipidemia   [facility], Dionne Bucy, MD       Future Appointments             In 3 weeks Stoioff, Ronda Fairly, MD [place]   In 3 months Bacigalupo, Dionne Bucy, MD [facility], [place]   In 3 months End, Harrell Gave, MD [facility] [place]   In 6 months [name], MD [place] BP in normal range    BP Readings from Last 1 Encounters:  06/05/20 122/82          Passed - Last Heart Rate in normal range    Pulse Readings from Last 1 Encounters:  06/05/20 70

## 2020-08-24 DIAGNOSIS — Z23 Encounter for immunization: Secondary | ICD-10-CM | POA: Diagnosis not present

## 2020-08-28 ENCOUNTER — Ambulatory Visit (INDEPENDENT_AMBULATORY_CARE_PROVIDER_SITE_OTHER): Payer: Medicare Other | Admitting: Nurse Practitioner

## 2020-08-28 ENCOUNTER — Encounter (INDEPENDENT_AMBULATORY_CARE_PROVIDER_SITE_OTHER): Payer: Self-pay | Admitting: Nurse Practitioner

## 2020-08-28 ENCOUNTER — Other Ambulatory Visit: Payer: Self-pay

## 2020-08-28 VITALS — BP 155/89 | HR 86 | Resp 16 | Wt 215.0 lb

## 2020-08-28 DIAGNOSIS — I10 Essential (primary) hypertension: Secondary | ICD-10-CM | POA: Diagnosis not present

## 2020-08-28 DIAGNOSIS — I480 Paroxysmal atrial fibrillation: Secondary | ICD-10-CM | POA: Diagnosis not present

## 2020-08-28 DIAGNOSIS — I890 Lymphedema, not elsewhere classified: Secondary | ICD-10-CM

## 2020-08-29 ENCOUNTER — Encounter (INDEPENDENT_AMBULATORY_CARE_PROVIDER_SITE_OTHER): Payer: Self-pay | Admitting: Nurse Practitioner

## 2020-08-29 NOTE — Progress Notes (Signed)
Subjective:    Patient ID: Cameron Foster., male    DOB: [birth year], 80 y.o.   MRN: [id number] Chief Complaint  Patient presents with  . Follow-up    6 month follow up    Cameron Foster is a 80 year old male that presents today for follow-up of lower extremity leg swelling.  The patient notes that he had a severe injury to his right lower extremity many years ago and this is someone that tends to have issues with swelling.  He has pain in his ankle and notes that he has been utilizing conservative therapy but has not noticed any change in his swelling.  However when discussing his compression further and notes that these are diabetic socks and not compression socks.  He denies any wounds or ulcerations he denies any fevers or chills.   Review of Systems  Cardiovascular: Positive for leg swelling.  Musculoskeletal: Positive for joint swelling.       Objective:   Physical Exam Vitals reviewed.  HENT:     Head: Normocephalic.  Cardiovascular:     Rate and Rhythm: Normal rate.     Pulses: Normal pulses.  Pulmonary:     Effort: Pulmonary effort is normal.  Musculoskeletal:     Right lower leg: 2+ Edema present.  Neurological:     Mental Status: He is alert and oriented to person, place, and time.  Psychiatric:        Mood and Affect: Mood normal.        Behavior: Behavior normal.        Thought Content: Thought content normal.        Judgment: Judgment normal.     BP (!) 155/89 (BP Location: Right Arm)   Pulse 86   Resp 16   Wt 215 lb (97.5 kg)   BMI 32.69 kg/m   Past Medical History:  Diagnosis Date  . Alcoholism ([place])   . Arthritis   . Atrial fibrillation ([place]) 1995   one episode  . Basal cell carcinoma 04/26/2017   Right medial cheek. Superficial and nodular  . Basal cell carcinoma 06/11/2019   Left anterior shoulder. Nodular pattern  . Basal cell carcinoma 09/09/2019   Right nasal ala, EDC  . Basal cell carcinoma 02/05/2020   L upper eyebrow, ED[facility]   .  Carotid arterial disease ([place])    a. Jul 01, 2019 Carotid U/S: <50% bilat ICA stenoses.  . Colon polyp   . Depression    Son died 06/30/14  . Diverticulitis   . Diverticulosis 30 years  . GERD (gastroesophageal reflux disease)   . History of echocardiogram    a. 2019/07/01 Echo: EF 55-60%, no rwma, triv MR/AI.  Marland Kitchen History of stress test    a. 09/2015 MV: No ischemia/infarct. EF 45-54% (nl by echo).  . Hyperlipidemia   . Hypertension   . Irritable bowel syndrome   . Prostate cancer ([place]) Jun 30, 2010   treated with radiation therapy. Prostate  . PSVT (paroxysmal supraventricular tachycardia) ([place])    a. 06/2019 Zio: Avg rate 74 (54-120), occas PACs, rare PVCs, 125 episodes of PVCs (longest 17.5 secs; max rate 187). No afib.  Marland Kitchen Squamous cell carcinoma of skin 07/18/2017   Left medial calf. KA type  . Squamous cell carcinoma of skin 04/24/2018   Right above med. brow  . Squamous cell carcinoma of skin 06/11/2019   Right posterior shoulder. SCCis, hypertrophic  . Squamous cell carcinoma of skin 01/09/2020   Mid nasal dorsum, MOHS, Efudex x  4wks  . Vasovagal syncope     Social History   Socioeconomic History  . Marital status: Married    Spouse name: Not on file  . Number of children: 2  . Years of education: Not on file  . Highest education level: Bachelor's degree (e.g., BA, AB, BS)  Occupational History  . Occupation: retired Dance movement psychotherapist  Tobacco Use  . Smoking status: Former Smoker    Packs/day: 1.00    Years: 27.00    Pack years: 27.00    Types: Cigarettes    Quit date: 04/18/1988    Years since quitting: 32.3  . Smokeless tobacco: Former Systems developer    Types: Secondary school teacher  . Vaping Use: Never used  Substance and Sexual Activity  . Alcohol use: Yes    Alcohol/week: 7.0 standard drinks    Types: 7 Glasses of wine per week    Comment: Formal heavy use ETOH  . Drug use: No  . Sexual activity: Not Currently    Birth control/protection: None    Comment: Married  Other Topics Concern  .  Not on file  Social History Narrative  . Not on file   Social Determinants of Health   Financial Resource Strain: Low Risk   . Difficulty of Paying Living Expenses: Not hard at all  Food Insecurity: No Food Insecurity  . Worried About Charity fundraiser in the Last Year: Never true  . Ran Out of Food in the Last Year: Never true  Transportation Needs: No Transportation Needs  . Lack of Transportation (Medical): No  . Lack of Transportation (Non-Medical): No  Physical Activity: Inactive  . Days of Exercise per Week: 0 days  . Minutes of Exercise per Session: 0 min  Stress: No Stress Concern Present  . Feeling of Stress : Not at all  Social Connections: Socially Integrated  . Frequency of Communication with Friends and Family: More than three times a week  . Frequency of Social Gatherings with Friends and Family: Twice a week  . Attends Religious Services: More than 4 times per year  . Active Member of Clubs or Organizations: Yes  . Attends Archivist Meetings: More than 4 times per year  . Marital Status: Married  Human resources officer Violence: Not At Risk  . Fear of Current or Ex-Partner: No  . Emotionally Abused: No  . Physically Abused: No  . Sexually Abused: No    Past Surgical History:  Procedure Laterality Date  . [place] no stents  . CATARACT EXTRACTION, BILATERAL    . COLON RESECTION SIGMOID N/A 12/07/2016   Procedure: COLON RESECTION SIGMOID;  Surgeon: Clayburn Pert, MD;  Location: [facility] ORS;  Service: General;  Laterality: N/A;  . COLON SURGERY  11/2016   Colostomy  . COLONOSCOPY  2016  . COLONOSCOPY WITH PROPOFOL N/A 05/30/2016   Procedure: COLONOSCOPY WITH PROPOFOL;  Surgeon: Lollie Sails, MD;  Location: [facility] ENDOSCOPY;  Service: Endoscopy;  Laterality: N/A;  . COLOSTOMY Left 12/07/2016   Procedure: COLOSTOMY;  Surgeon: Clayburn Pert, MD;  Location: [facility] ORS;  Service: General;  Laterality: Left;  .  COLOSTOMY REVERSAL N/A 03/21/2017   Procedure: COLOSTOMY REVERSAL;  Surgeon: Clayburn Pert, MD;  Location: [facility] ORS;  Service: General;  Laterality: N/A;  . COLOSTOMY TAKEDOWN N/A 03/21/2017   Procedure: LAPAROSCOPIC COLOSTOMY TAKEDOWN;  Surgeon: Clayburn Pert, MD;  Location: [facility] ORS;  Service: General;  Laterality: N/A;  . CYSTOSCOPY WITH STENT PLACEMENT Bilateral  03/21/2017   Procedure: CYSTOSCOPY WITH LIGHTED STENT PLACEMENT;  Surgeon: Abbie Sons, MD;  Location: [facility] ORS;  Service: Urology;  Laterality: Bilateral;  . ESOPHAGOGASTRODUODENOSCOPY    . ESOPHAGOGASTRODUODENOSCOPY N/A 05/30/2016   Procedure: ESOPHAGOGASTRODUODENOSCOPY (EGD);  Surgeon: Lollie Sails, MD;  Location: [facility] END[facility]COPY;  Service: Endoscopy;  Laterality: N/A;  . EYE SURGERY     cataracts  . FLEXIBLE SIGMOIDOSCOPY N/A 03/21/2017   Procedure: FLEXIBLE SIGMOIDOSCOPY;  Surgeon: Clayburn Pert, MD;  Location: [facility] ORS;  Service: General;  Laterality: N/A;  . FRACTURE SURGERY Bilateral    right arm and left wrist  . INCISION AND DRAINAGE ABSCESS N/A 12/07/2016   Procedure: DRAINAGE  OF INTRA ABDOMINAL ABSCESS;  Surgeon: Clayburn Pert, MD;  Location: [facility] ORS;  Service: General;  Laterality: N/A;  . KNEE ARTHROSCOPY    . LAPAROTOMY N/A 12/07/2016   Procedure: EXPLORATORY LAPAROTOMY;  Surgeon: Clayburn Pert, MD;  Location: [facility] ORS;  Service: General;  Laterality: N/A;  . PROSTATE SURGERY     Microwave therapy  . TONSILLECTOMY    . TOTAL KNEE ARTHROPLASTY Right 07/27/2016   Procedure: TOTAL KNEE ARTHROPLASTY;  Surgeon: [name], MD;  Location: [facility] ORS;  Service: Orthopedics;  Laterality: Right;  Dr. Erlene Quan had to place Urinary catheter due to prostate cancer history.  Using flexible scope.    Family History  Problem Relation Age of Onset  . Lung cancer Father        smoker  . Other Mother   . Sudden death Son        due to "Blood clots"  . Bipolar disorder Son   . Heart disease Son   . Kidney  disease Daughter        congenital one small kidney  . Prostate cancer Neg Hx   . Bladder Cancer Neg Hx     Allergies  Allergen Reactions  . Formaldehyde Rash    CBC Latest Ref Rng & Units 03/14/2020 06/10/2019 06/08/2019  WBC 4.0 - 10.5 K/uL 8.5 6.0 6.9  Hemoglobin 13.0 - 17.0 g/dL 11.7(L) 11.5(L) 12.4(L)  Hematocrit 39.0 - 52.0 % 36.3(L) 36.1(L) 39.0  Platelets 150 - 400 K/uL 199 213 239      CMP     Component Value Date/Time   NA 137 03/14/2020 1506   NA 144 08/15/2019 0832   K 3.6 03/14/2020 1506   CL 102 03/14/2020 1506   CO2 25 03/14/2020 1506   GLUCOSE 118 (H) 03/14/2020 1506   BUN 14 03/14/2020 1506   BUN 17 08/15/2019 0832   CREATININE 1.40 (H) 03/14/2020 1506   CREATININE 1.40 (H) 08/12/2015 1632   CALCIUM 9.4 03/14/2020 1506   PROT 7.2 06/08/2019 1452   PROT 7.0 05/15/2019 1416   ALBUMIN 3.9 06/08/2019 1452   ALBUMIN 4.2 05/15/2019 1416   AST 27 06/08/2019 1452   ALT 15 06/08/2019 1452   ALKPHOS 75 06/08/2019 1452   BILITOT 0.8 06/08/2019 1452   BILITOT 0.3 05/15/2019 1416   GFRNONAA 51 (L) 03/14/2020 1506   GFRAA 62 08/15/2019 0832     No results found.     Assessment & Plan:   1. Lymphedema I discussed the use of proper compression with the patient and that entails using 20 to 30 mmHg compression socks.  Typically diabetic socks or not firm enough for patients with lymphedema.  His lymphedema is likely related to his traumatic injury he had decades ago.  The patient is advised to wear his compression socks daily first thing  in the morning and remove before bed.  He is also advised not to sleep in the stockings.  The patient is also advised to elevate lower extremities is much as possible.  The patient will replace compression socks and pending progress with conservative therapy we will consider a lymphedema pump or possible referral to the lymphedema clinic.  2. Paroxysmal atrial fibrillation (HCC) This may contribute to the patient's lower  extremity edema.  3. Essential hypertension Continue antihypertensive medications as already ordered, these medications have been reviewed and there are no changes at this time.    Current Outpatient Medications on File Prior to Visit  Medication Sig Dispense Refill  . amLODipine (NORVASC) 5 MG tablet TAKE 1 TABLET BY MOUTH EVERY DAY 90 tablet 1  . aspirin 81 MG EC tablet Take 81 mg by mouth daily.     Marland Kitchen atorvastatin (LIPITOR) 40 MG tablet Take 1 tablet (40 mg total) by mouth daily. 90 tablet 3  . cetirizine (ZYRTEC) 10 MG tablet Take 1 tablet (10 mg total) by mouth daily. 30 tablet 11  . Cyanocobalamin (VITAMIN B 12 PO) Take 1,000 mcg by mouth daily.    . furosemide (LASIX) 20 MG tablet TAKE 1 TABLET BY MOUTH EVERY DAY 90 tablet 1  . hydrocortisone 2.5 % cream APPLY TO AFFECTED AREA TWICE A DAY AS NEEDED FOR RASH 28.35 g 12  . lisinopril (ZESTRIL) 40 MG tablet TAKE 1 TABLET BY MOUTH EVERY DAY 90 tablet 1  . meclizine (ANTIVERT) 25 MG tablet Take 25 mg by mouth 2 (two) times daily as needed.    . meloxicam (MOBIC) 15 MG tablet Take 1 tablet (15 mg total) by mouth daily as needed for pain.    . metoprolol succinate (TOPROL-XL) 50 MG 24 hr tablet TAKE 1 TABLET BY MOUTH DAILY. TAKE WITH OR IMMEDIATELY FOLLOWING A MEAL. 90 tablet 1  . Multiple Vitamin (MULTIVITAMIN WITH MINERALS) TABS tablet Take 1 tablet by mouth daily. 30 tablet 0  . omeprazole (PRILOSEC) 20 MG capsule TAKE 1 CAPSULE (20 MG TOTAL) BY MOUTH DAILY AS NEEDED (HEARTBURN). 90 capsule 1  . sertraline (ZOLOFT) 100 MG tablet TAKE 1 TABLET BY MOUTH EVERY DAY 90 tablet 1   No current facility-administered medications on file prior to visit.    There are no Patient Instructions on file for this visit. No follow-ups on file.   Kris Hartmann, NP

## 2020-08-31 ENCOUNTER — Ambulatory Visit (INDEPENDENT_AMBULATORY_CARE_PROVIDER_SITE_OTHER): Payer: Medicare Other | Admitting: Nurse Practitioner

## 2020-09-02 ENCOUNTER — Ambulatory Visit: Payer: Self-pay | Admitting: *Deleted

## 2020-09-02 ENCOUNTER — Other Ambulatory Visit: Payer: Self-pay | Admitting: Internal Medicine

## 2020-09-02 ENCOUNTER — Other Ambulatory Visit: Payer: Self-pay | Admitting: Family Medicine

## 2020-09-02 DIAGNOSIS — I10 Essential (primary) hypertension: Secondary | ICD-10-CM

## 2020-09-02 NOTE — Telephone Encounter (Signed)
B/P elevated readings at home since 08/28/20 after having 155/89 at vascular last week. Today 169/91 and now 142/85 HR 70 bpm. Denies any symptoms with elevated readings since monitoring from 08/28/20. No CP/SOB/weakness/HA/vision changes. Takes Norvasc 5 mg daily and lisinopril 40 mg tab daily in the am. Discussed frozen meal intake also adds salt to diet and eats out a lot. Reviewed measures to decrease NA intake and increase daily water intake now. Reviewed B/P parameters of >180/110 or any symptoms he is to seek evaluation at the ED immediately. No availability with pcp for over 3 weeks. Appointment scheduled with D. [place], [place] for 09/17/20.  Reason for Disposition . [9] Systolic BP  >= 432 OR Diastolic >= 80 AND [7] taking BP medications  Answer Assessment - Initial Assessment Questions 1. BLOOD PRESSURE: "What is the blood pressure?" "Did you take at least two measurements 5 minutes apart?"     Yes, 169/91 earlier and now  2. ONSET: "When did you take your blood pressure?"     This morning 3. HOW: "How did you obtain the blood pressure?" (e.g., visiting nurse, automatic home BP monitor)     Home monitor 4. HISTORY: "Do you have a history of high blood pressure?"     Yes 5. MEDICATIONS: "Are you taking any medications for blood pressure?" "Have you missed any doses recently?"     Yes, taking as prescribed. No missed doses 6. OTHER SYMPTOMS: "Do you have any symptoms?" (e.g., headache, chest pain, blurred vision, difficulty breathing, weakness)     none 7. PREGNANCY: "Is there any chance you are pregnant?" "When was your last menstrual period?"     na  Protocols used: BLOOD PRESSURE - HIGH-A-AH

## 2020-09-02 NOTE — Telephone Encounter (Signed)
}  Notes to clinic:  Patient has upcoming appt on 11/23/2020 Review for refill until that time Overdue for 6 month follow up   Requested Prescriptions  Pending Prescriptions Disp Refills   amLODipine (NORVASC) 10 MG tablet [Pharmacy Med Name: AMLODIPINE BESYLATE 10 MG TAB] 90 tablet 1    Sig: TAKE 1 TABLET BY MOUTH EVERY DAY      Cardiovascular:  Calcium Channel Blockers Failed - 09/02/2020 11:19 AM      Failed - Last BP in normal range    BP Readings from Last 1 Encounters:  08/28/20 (!) 155/89          Failed - Valid encounter within last 6 months    Recent Outpatient Visits           3 months ago Essential hypertension   [facility] [place], Dionne Bucy, MD   6 months ago Essential hypertension   [facility] [place], Dionne Bucy, MD   7 months ago Swelling of right lower extremity   [facility] [place], Dionne Bucy, MD   9 months ago Essential hypertension   [place], Dionne Bucy, MD   1 year ago Mixed hyperlipidemia   [facility] [place], Dionne Bucy, MD       Future Appointments             In 1 week [place], Ronda Fairly, MD [place]   In 2 months [place], Dionne Bucy, MD [facility], [place]   In 3 months [place], Harrell Gave, MD [facility], [place]   In 5 months [name], MD [place]               fluticasone [facility]) 50 MCG/ACT nasal spray [Pharmacy Med Name: FLUTICASONE PROP 50 MCG SPRAY] 48 mL 3    Sig: PLACE 1 SPRAY INTO BOTH NOSTRILS DAILY AS NEEDED FOR ALLERGIES OR RHINITIS.      Ear, Nose, and Throat: Nasal Preparations - Corticosteroids Passed - 09/02/2020 11:19 AM      Passed - Valid encounter within last 12 months    Recent Outpatient Visits           3 months ago Essential hypertension   [facility] [place], Dionne Bucy, MD   6 months ago Essential hypertension   [facility] [place], Dionne Bucy, MD   7 months ago Swelling of right lower extremity   [facility], Dionne Bucy, MD   9 months ago Essential hypertension   [place], Dionne Bucy, MD   1 year ago Mixed hyperlipidemia   [facility] [place], Dionne Bucy, MD       Future Appointments             In 1 week Stoioff, Ronda Fairly, MD [place]   In 2 months [place], Dionne Bucy, MD [facility], [place]   In 3 months End, Harrell Gave, MD [facility], [place]   In 5 months [name], MD [place]               atorvastatin (LIPITOR) 40 MG tablet [Pharmacy Med Name: ATORVASTATIN 40 MG TABLET] 90 tablet 3    Sig: TAKE 1 TABLET BY MOUTH EVERY DAY      Cardiovascular:  Antilipid - Statins Failed - 09/02/2020 11:19 AM      Failed - Total Cholesterol in normal range and within 360 days    Cholesterol,  Total  Date Value Ref Range Status  08/15/2019 156 100 - 199 mg/dL Final          Failed - LDL in normal range and within 360 days    LDL Chol Calc (NIH)  Date Value Ref Range Status  08/15/2019 85 0 - 99 mg/dL Final          Failed - HDL in normal range and within 360 days    HDL  Date Value Ref Range Status  08/15/2019 46 >39 mg/dL Final          Failed - Triglycerides in normal range and within 360 days    Triglycerides  Date Value Ref Range Status  08/15/2019 144 0 - 149 mg/dL Final          Passed - Patient is not pregnant      Passed - Valid encounter within last 12 months    Recent Outpatient Visits           3 months ago Essential hypertension   [place], Dionne Bucy, MD   6 months ago Essential hypertension   [facility] [place], Dionne Bucy, MD   7 months ago Swelling of right lower extremity   [facility] [place], Dionne Bucy, MD   9 months ago Essential hypertension    [place], Dionne Bucy, MD   1 year ago Mixed hyperlipidemia   [place], Dionne Bucy, MD       Future Appointments             In 1 week Stoioff, Ronda Fairly, MD [place]   In 2 months [place], Dionne Bucy, MD [facility], [place]   In 3 months End, Harrell Gave, MD [facility], [place]   In 5 months [name], MD [place]               amLODipine ([place]) 5 MG tablet [Pharmacy Med Name: AMLODIPINE BESYLATE 5 MG TAB] 90 tablet 1    Sig: TAKE 1 TABLET BY MOUTH EVERY DAY      Cardiovascular:  Calcium Channel Blockers Failed - 09/02/2020 11:19 AM      Failed - Last BP in normal range    BP Readings from Last 1 Encounters:  08/28/20 (!) 155/89          Failed - Valid encounter within last 6 months    Recent Outpatient Visits           3 months ago Essential hypertension   [place], Dionne Bucy, MD   6 months ago Essential hypertension   [facility] [place], Dionne Bucy, MD   7 months ago Swelling of right lower extremity   [facility] [place], Dionne Bucy, MD   9 months ago Essential hypertension   [place], Dionne Bucy, MD   1 year ago Mixed hyperlipidemia   [facility] [place], Dionne Bucy, MD       Future Appointments             In 1 week Stoioff, Ronda Fairly, MD [place]   In 2 months [place], Dionne Bucy, MD [facility], [place]   In 3 months End, Harrell Gave, MD [facility], [place]   In 5 months [name], MD [place]               omeprazole (PRILOSEC) 20 MG capsule [facility]  Med Name: OMEPRAZOLE DR 20 MG CAPSULE] 90 capsule 1    Sig: TAKE 1 CAPSULE (20 MG TOTAL) BY MOUTH DAILY AS NEEDED (HEARTBURN).      Gastroenterology: Proton Pump Inhibitors Passed - 09/02/2020 11:19 AM       Passed - Valid encounter within last 12 months    Recent Outpatient Visits           3 months ago Essential hypertension   [facility] [place], Dionne Bucy, MD   6 months ago Essential hypertension   [facility] [place], Dionne Bucy, MD   7 months ago Swelling of right lower extremity   [facility], Dionne Bucy, MD   9 months ago Essential hypertension   [facility], Dionne Bucy, MD   1 year ago Mixed hyperlipidemia   [facility], Dionne Bucy, MD       Future Appointments             In 1 week Stoioff, Ronda Fairly, MD [place]   In 2 months [place], Dionne Bucy, MD [facility], [place]   In 3 months End, Harrell Gave, MD [facility], [place]   In 5 months [name], MD [place]               lisinopril (ZESTRIL) 10 MG tablet [Pharmacy Med Name: LISINOPRIL 10 MG TABLET] 90 tablet 0    Sig: TAKE 1 TABLET BY MOUTH EVERY DAY      Cardiovascular:  ACE Inhibitors Failed - 09/02/2020 11:19 AM      Failed - Cr in normal range and within 180 days    Creat  Date Value Ref Range Status  08/12/2015 1.40 (H) 0.70 - 1.18 mg/dL Final   Creatinine, Ser  Date Value Ref Range Status  03/14/2020 1.40 (H) 0.61 - 1.24 mg/dL Final          Failed - Last BP in normal range    BP Readings from Last 1 Encounters:  08/28/20 (!) 155/89          Failed - Valid encounter within last 6 months    Recent Outpatient Visits           3 months ago Essential hypertension   [place], Dionne Bucy, MD   6 months ago Essential hypertension   [facility] [place], Dionne Bucy, MD   7 months ago Swelling of right lower extremity   [facility], Dionne Bucy, MD   9 months ago Essential hypertension   [place], Dionne Bucy, MD   1 year ago  Mixed hyperlipidemia   [place], Dionne Bucy, MD       Future Appointments             In 1 week Stoioff, Ronda Fairly, MD [place]   In 2 months [place], Dionne Bucy, [facility], [place]   In 3 months End, Harrell Gave, MD [facility], [place]   In 5 months [name], MD [place] in normal range and within 180 days    Potassium  Date Value Ref Range Status  03/14/2020 3.6 3.5 - 5.1 mmol/L Final          Passed - Patient is not pregnant        lisinopril (ZESTRIL) 40 MG  tablet [Pharmacy Med Name: LISINOPRIL 40 MG TABLET] 90 tablet 1    Sig: TAKE 1 TABLET BY MOUTH EVERY DAY      Cardiovascular:  ACE Inhibitors Failed - 09/02/2020 11:19 AM      Failed - Cr in normal range and within 180 days    Creat  Date Value Ref Range Status  08/12/2015 1.40 (H) 0.70 - 1.18 mg/dL Final   Creatinine, Ser  Date Value Ref Range Status  03/14/2020 1.40 (H) 0.61 - 1.24 mg/dL Final          Failed - Last BP in normal range    BP Readings from Last 1 Encounters:  08/28/20 (!) 155/89          Failed - Valid encounter within last 6 months    Recent Outpatient Visits           3 months ago Essential hypertension   [facility] [place], Dionne Bucy, MD   6 months ago Essential hypertension   [facility] [place], Dionne Bucy, MD   7 months ago Swelling of right lower extremity   [facility] [place], Dionne Bucy, MD   9 months ago Essential hypertension   [place], Dionne Bucy, MD   1 year ago Mixed hyperlipidemia   [facility] [place], Dionne Bucy, MD       Future Appointments             In 1 week Stoioff, Ronda Fairly, MD [place]   In 2 months [place], Dionne Bucy, MD [facility], [place]   In 3 months [place], Harrell Gave, MD [place], [place]   In 5 months [name], MD [place] in normal range and within 180 days    Potassium  Date Value Ref Range Status  03/14/2020 3.6 3.5 - 5.1 mmol/L Final          Passed - Patient is not pregnant

## 2020-09-04 ENCOUNTER — Telehealth: Payer: Self-pay

## 2020-09-04 NOTE — Telephone Encounter (Signed)
Pt called back and requested he is sent a list of all the medications he should be taking, pt stated he is confused on what he is supposed to be taking.

## 2020-09-04 NOTE — Telephone Encounter (Signed)
Called patient and went over medication list in detail, patient was advised to take Amlodipine 59m instead of 113m

## 2020-09-04 NOTE — Telephone Encounter (Signed)
Copied from [place] [id number]. Topic: General - Other >> Sep 04, 2020  9:18 AM Leward Quan A wrote: Reason for CRM: Patient called in to speak to Dr B nurse say that he need to discuss his medications with her. Can be reached at Ph# [phone]

## 2020-09-04 NOTE — Telephone Encounter (Signed)
Okay the atorvastatin is the same thing regardless of whether it is calcium.  It seems like the amlodipine was probably a mistake.  Lets take the 10 mg off of his list and make sure that he is taking the 5 mg daily as he has been.

## 2020-09-04 NOTE — Telephone Encounter (Signed)
Patient requesting clarification on amlodipine prescription, he has been taking 5 mg daily. Yesterday prescription for 5 mg and 10 mg sent to pharmacy. Patient also has question on atorvastatin, he reports that prescription he got yesterday only says atorvastatin and not atorvastatin calcium. Please advise.

## 2020-09-07 ENCOUNTER — Other Ambulatory Visit: Payer: Medicare Other

## 2020-09-07 ENCOUNTER — Other Ambulatory Visit: Payer: Self-pay

## 2020-09-07 ENCOUNTER — Encounter: Payer: Self-pay | Admitting: Family Medicine

## 2020-09-07 DIAGNOSIS — Z8546 Personal history of malignant neoplasm of prostate: Secondary | ICD-10-CM

## 2020-09-07 NOTE — Telephone Encounter (Signed)
Called the patient. lmtcb.

## 2020-09-07 NOTE — Telephone Encounter (Signed)
Patient calling to check on status of BP readings Please call [phone] to discuss

## 2020-09-07 NOTE — Telephone Encounter (Signed)
Patient returning call.

## 2020-09-08 LAB — PSA: Prostate Specific Ag, Serum: <0.1 ng/mL (ref 0.0–4.0)

## 2020-09-09 ENCOUNTER — Other Ambulatory Visit: Payer: Self-pay

## 2020-09-09 ENCOUNTER — Ambulatory Visit (INDEPENDENT_AMBULATORY_CARE_PROVIDER_SITE_OTHER): Payer: Medicare Other | Admitting: Urology

## 2020-09-09 ENCOUNTER — Encounter: Payer: Self-pay | Admitting: Urology

## 2020-09-09 VITALS — BP 145/78 | HR 78 | Ht 68.0 in | Wt 200.0 lb

## 2020-09-09 DIAGNOSIS — E291 Testicular hypofunction: Secondary | ICD-10-CM

## 2020-09-09 DIAGNOSIS — Z8546 Personal history of malignant neoplasm of prostate: Secondary | ICD-10-CM | POA: Diagnosis not present

## 2020-09-09 DIAGNOSIS — N3943 Post-void dribbling: Secondary | ICD-10-CM

## 2020-09-09 DIAGNOSIS — N4889 Other specified disorders of penis: Secondary | ICD-10-CM | POA: Diagnosis not present

## 2020-09-09 MED ORDER — SILDENAFIL CITRATE 20 MG PO TABS: 20.0000 mg | ORAL_TABLET | Freq: Every day | ORAL | 1 refills | Status: DC

## 2020-09-09 MED ORDER — SILDENAFIL CITRATE 20 MG PO TABS
20.0000 mg | ORAL_TABLET | Freq: Every day | ORAL | 1 refills | Status: DC
Start: 2020-09-09 — End: 2020-09-09

## 2020-09-09 NOTE — Progress Notes (Signed)
09/09/2020 1:24 PM   [place]. 1940/06/03 [id number]  Referring provider: [name], MD [address] [place],  [place] [address]  Chief Complaint  Patient presents with  . Prostate Cancer    Urologic history: 1. Intermediate risk prostate cancer  Diagnosed Jun 22, 2010 in [place]  Treated IMRT + ADT x6 months  2.  Erectile dysfunction   HPI: 80 y.o. male presents for follow-up.   Last visit was a telemedicine visit May 2020 and PSA at that time was <0.1  He wanted to discuss several issues today which he states are sequela from his prostate cancer treatment  He states when he was on ADT he lost body hair and penile atrophy  Complains of dribbling after urination and due to penile size in the urine runs down his scrotum  Also complains of loss of body hair and is requesting to be placed on testosterone so he can get this back  Denies frequency, urgency or urge incontinence  PSA 09/07/2020 <0.1   PMH: Past Medical History:  Diagnosis Date  . Alcoholism ([place])   . Arthritis   . Atrial fibrillation ([place]) 1995   one episode  . Basal cell carcinoma 04/26/2017   Right medial cheek. Superficial and nodular  . Basal cell carcinoma 06/11/2019   Left anterior shoulder. Nodular pattern  . Basal cell carcinoma 09/09/2019   Right nasal ala, [facility]  . Basal cell carcinoma 02/05/2020   L upper eyebrow, [facility]   . Carotid arterial disease ([place])    a. 06-23-19 Carotid U/S: <50% bilat ICA stenoses.  . Colon polyp   . Depression    Son died 2014-06-22  . Diverticulitis   . Diverticulosis 30 years  . GERD (gastroesophageal reflux disease)   . History of echocardiogram    a. 06/23/2019 Echo: EF 55-60%, no rwma, triv MR/AI.  Marland Kitchen History of stress test    a. 09/2015 MV: No ischemia/infarct. EF 45-54% (nl by echo).  . Hyperlipidemia   . Hypertension   . Irritable bowel syndrome   . Prostate cancer ([place]) 06-22-10   treated with radiation therapy. Prostate  . PSVT  (paroxysmal supraventricular tachycardia) ([place])    a. 06/2019 Zio: Avg rate 74 (54-120), occas PACs, rare PVCs, 125 episodes of PVCs (longest 17.5 secs; max rate 187). No afib.  Marland Kitchen Squamous cell carcinoma of skin 07/18/2017   Left medial calf. KA type  . Squamous cell carcinoma of skin 04/24/2018   Right above med. brow  . Squamous cell carcinoma of skin 06/11/2019   Right posterior shoulder. SCCis, hypertrophic  . Squamous cell carcinoma of skin 01/09/2020   Mid nasal dorsum, MOHS, Efudex x 4wks  . Vasovagal syncope     Surgical History: Past Surgical History:  Procedure Laterality Date  . [place] no stents  . CATARACT EXTRACTION, BILATERAL    . COLON RESECTION SIGMOID N/A 12/07/2016   Procedure: COLON RESECTION SIGMOID;  Surgeon: Clayburn Pert, MD;  Location: [facility] ORS;  Service: General;  Laterality: N/A;  . COLON SURGERY  11/2016   Colostomy  . COLONOSCOPY  June 22, 2014  . COLONOSCOPY WITH PROPOFOL N/A 05/30/2016   Procedure: COLONOSCOPY WITH PROPOFOL;  Surgeon: Lollie Sails, MD;  Location: [facility] ENDOSCOPY;  Service: Endoscopy;  Laterality: N/A;  . COLOSTOMY Left 12/07/2016   Procedure: COLOSTOMY;  Surgeon: Clayburn Pert, MD;  Location: [facility] ORS;  Service: General;  Laterality: Left;  . COLOSTOMY REVERSAL N/A 03/21/2017   Procedure:  COLOSTOMY REVERSAL;  Surgeon: Clayburn Pert, MD;  Location: [facility] ORS;  Service: General;  Laterality: N/A;  . COLOSTOMY TAKEDOWN N/A 03/21/2017   Procedure: LAPAROSCOPIC COLOSTOMY TAKEDOWN;  Surgeon: Clayburn Pert, MD;  Location: [facility] ORS;  Service: General;  Laterality: N/A;  . CYSTOSCOPY WITH STENT PLACEMENT Bilateral 03/21/2017   Procedure: CYSTOSCOPY WITH LIGHTED STENT PLACEMENT;  Surgeon: Abbie Sons, MD;  Location: [facility] ORS;  Service: Urology;  Laterality: Bilateral;  . ESOPHAGOGASTRODUODENOSCOPY    . ESOPHAGOGASTRODUODENOSCOPY N/A 05/30/2016   Procedure: ESOPHAGOGASTRODUODENOSCOPY (EGD);  Surgeon:  Lollie Sails, MD;  Location: [facility] ENDOSCOPY;  Service: Endoscopy;  Laterality: N/A;  . EYE SURGERY     cataracts  . FLEXIBLE SIGMOIDOSCOPY N/A 03/21/2017   Procedure: FLEXIBLE SIGMOIDOSCOPY;  Surgeon: Clayburn Pert, MD;  Location: [facility] ORS;  Service: General;  Laterality: N/A;  . FRACTURE SURGERY Bilateral    right arm and left wrist  . INCISION AND DRAINAGE ABSCESS N/A 12/07/2016   Procedure: DRAINAGE  OF INTRA ABDOMINAL ABSCESS;  Surgeon: Clayburn Pert, MD;  Location: [facility] ORS;  Service: General;  Laterality: N/A;  . KNEE ARTHROSCOPY    . LAPAROTOMY N/A 12/07/2016   Procedure: EXPLORATORY LAPAROTOMY;  Surgeon: Clayburn Pert, MD;  Location: [facility] ORS;  Service: General;  Laterality: N/A;  . PROSTATE SURGERY     Microwave therapy  . TONSILLECTOMY    . TOTAL KNEE ARTHROPLASTY Right 07/27/2016   Procedure: TOTAL KNEE ARTHROPLASTY;  Surgeon: [name], MD;  Location: [facility] ORS;  Service: Orthopedics;  Laterality: Right;  Dr. Erlene Quan had to place Urinary catheter due to prostate cancer history.  Using flexible scope.    Home Medications:  Allergies as of 09/09/2020      Reactions   Formaldehyde Rash      Medication List       Accurate as of Sep 09, 2020  1:24 PM. If you have any questions, ask your nurse or doctor.        amLODipine 5 MG tablet Commonly known as: NORVASC TAKE 1 TABLET BY MOUTH EVERY DAY   aspirin 81 MG EC tablet Take 81 mg by mouth daily.   atorvastatin 40 MG tablet Commonly known as: LIPITOR TAKE 1 TABLET BY MOUTH EVERY DAY   cetirizine 10 MG tablet Commonly known as: ZYRTEC Take 1 tablet (10 mg total) by mouth daily.   fluticasone 50 MCG/ACT nasal spray Commonly known as: FLONASE PLACE 1 SPRAY INTO BOTH NOSTRILS DAILY AS NEEDED FOR ALLERGIES OR RHINITIS.   furosemide 20 MG tablet Commonly known as: LASIX TAKE 1 TABLET BY MOUTH EVERY DAY   hydrocortisone 2.5 % cream APPLY TO AFFECTED AREA TWICE A DAY AS NEEDED FOR RASH   lisinopril 10  MG tablet Commonly known as: ZESTRIL TAKE 1 TABLET BY MOUTH EVERY DAY   lisinopril 40 MG tablet Commonly known as: ZESTRIL TAKE 1 TABLET BY MOUTH EVERY DAY   meclizine 25 MG tablet Commonly known as: ANTIVERT Take 25 mg by mouth 2 (two) times daily as needed.   meloxicam 15 MG tablet Commonly known as: MOBIC Take 1 tablet (15 mg total) by mouth daily as needed for pain.   metoprolol succinate 50 MG 24 hr tablet Commonly known as: TOPROL-XL TAKE 1 TABLET BY MOUTH DAILY. TAKE WITH OR IMMEDIATELY FOLLOWING A MEAL.   multivitamin with minerals Tabs tablet Take 1 tablet by mouth daily.   omeprazole 20 MG capsule Commonly known as: PRILOSEC TAKE 1 CAPSULE (20 MG TOTAL) BY MOUTH DAILY AS NEEDED (HEARTBURN).  sertraline 100 MG tablet Commonly known as: ZOLOFT TAKE 1 TABLET BY MOUTH EVERY DAY   VITAMIN B 12 PO Take 1,000 mcg by mouth daily.       Allergies:  Allergies  Allergen Reactions  . Formaldehyde Rash    Family History: Family History  Problem Relation Age of Onset  . Lung cancer Father        smoker  . Other Mother   . Sudden death Son        due to "Blood clots"  . Bipolar disorder Son   . Heart disease Son   . Kidney disease Daughter        congenital one small kidney  . Prostate cancer Neg Hx   . Bladder Cancer Neg Hx     Social History:  reports that he quit smoking about 32 years ago. His smoking use included cigarettes. He has a 27.00 pack-year smoking history. He has quit using smokeless tobacco.  His smokeless tobacco use included chew. He reports current alcohol use of about 7.0 standard drinks of alcohol per week. He reports that he does not use drugs.   Physical Exam: BP (!) 145/78   Pulse 78   Ht 5' 8"  (1.727 m)   Wt 200 lb (90.7 kg)   BMI 30.41 kg/m   Constitutional:  Alert, No acute distress. HEENT: [place] AT, moist mucus membranes.  Trachea midline, no masses. Cardiovascular: No clubbing, cyanosis, or edema. Respiratory: Normal  respiratory effort, no increased work of breathing. GU: Phallus circumcised without lesions, stretched penile length is normal; no prior exam for comparison Skin: No rashes, bruises or suspicious lesions. Neurologic: Grossly intact, no focal deficits, moving all 4 extremities. Psychiatric: Normal mood and affect.   Assessment & Plan:    1.  History prostate cancer  PSA remains undetectable  2.  Penile atrophy  We discussed loss of oxygenated blood flow through the corporal tissues can result in penile atrophy and can occur after prostate cancer treatment and ADT  Discussed that once this occurs it is not reversible  He inquired about taking sildenafil and was informed that low-dose PDE 5 inhibitor can improve oxygenated blood flow and may prevent the problem from getting worse but would not correct  Rx sildenafil was sent to his pharmacy 20 mg daily for a 56-monthtrial  3.  Hypogonadism  Would not recommend TRT for loss of body hair  4.  Postvoid dribbling  He feels this is due to penile atrophy   SAbbie Sons MD  [address] [place] [phone]

## 2020-09-10 ENCOUNTER — Encounter: Payer: Self-pay | Admitting: Internal Medicine

## 2020-09-10 ENCOUNTER — Ambulatory Visit (INDEPENDENT_AMBULATORY_CARE_PROVIDER_SITE_OTHER): Payer: Medicare Other | Admitting: Internal Medicine

## 2020-09-10 VITALS — BP 110/80 | HR 70 | Ht 68.0 in | Wt 214.0 lb

## 2020-09-10 DIAGNOSIS — I10 Essential (primary) hypertension: Secondary | ICD-10-CM

## 2020-09-10 DIAGNOSIS — R600 Localized edema: Secondary | ICD-10-CM

## 2020-09-10 DIAGNOSIS — I480 Paroxysmal atrial fibrillation: Secondary | ICD-10-CM

## 2020-09-10 NOTE — Patient Instructions (Addendum)
Medication Instructions:  No changes at this time.  *If you need a refill on your cardiac medications before your next appointment, please call your pharmacy*   Lab Work: None  If you have labs (blood work) drawn today and your tests are completely normal, you will receive your results only by: Marland Kitchen MyChart Message (if you have MyChart) OR . A paper copy in the mail If you have any lab test that is abnormal or we need to change your treatment, we will call you to review the results.   Testing/Procedures: None   Follow-Up: At [facility], you and your health needs are our priority.  As part of our continuing mission to provide you with exceptional heart care, we have created designated Provider Care Teams.  These Care Teams include your primary Cardiologist (physician) and Advanced Practice Providers (APPs -  Physician Assistants and Nurse Practitioners) who all work together to provide you with the care you need, when you need it.    Your next appointment:   Keep upcoming appointment in August.  Please monitor blood pressures and keep a log of your readings.   Make sure to check 2 hours after your medications.   AVOID these things for 30 minutes before checking your blood pressure:  No Drinking caffeine.  No Drinking alcohol.  No Eating.  No Smoking.  No Exercising.  Five minutes before checking your blood pressure:  Pee.  Sit in a dining chair. Avoid sitting in a soft couch or armchair.  Be quiet. Do not talk.    How to Take Your Blood Pressure Blood pressure measures how strongly your blood is pressing against the walls of your arteries. Arteries are blood vessels that carry blood from your heart throughout your body. You can take your blood pressure at home with a machine. You may need to check your blood pressure at home:  To check if you have high blood pressure (hypertension).  To check your blood pressure over time.  To make sure your blood pressure  medicine is working. Supplies needed:  Blood pressure machine, or monitor.  Dining room chair to sit in.  Table or desk.  Small notebook.  Pencil or pen. How to prepare Avoid these things for 30 minutes before checking your blood pressure:  Having drinks with caffeine in them, such as coffee or tea.  Drinking alcohol.  Eating.  Smoking.  Exercising. Do these things five minutes before checking your blood pressure:  Go to the bathroom and pee (urinate).  Sit in a dining chair. Do not sit in a soft couch or an armchair.  Be quiet. Do not talk. How to take your blood pressure Follow the instructions that came with your machine. If you have a digital blood pressure monitor, these may be the instructions: 1. Sit up straight. 2. Place your feet on the floor. Do not cross your ankles or legs. 3. Rest your left arm at the level of your heart. You may rest it on a table, desk, or chair. 4. Pull up your shirt sleeve. 5. Wrap the blood pressure cuff around the upper part of your left arm. The cuff should be 1 inch (2.5 cm) above your elbow. It is best to wrap the cuff around bare skin. 6. Fit the cuff snugly around your arm. You should be able to place only one finger between the cuff and your arm. 7. Place the cord so that it rests in the bend of your elbow. 8. Press the power  button. 9. Sit quietly while the cuff fills with air and loses air. 10. Write down the numbers on the screen. 11. Wait 2-3 minutes and then repeat steps 1-10.   What do the numbers mean? Two numbers make up your blood pressure. The first number is called systolic pressure. The second is called diastolic pressure. An example of a blood pressure reading is "120 over 80" (or 120/80). If you are an adult and do not have a medical condition, use this guide to find out if your blood pressure is normal: Normal  First number: below 120.  Second number: below 80. Elevated  First number: 120-129.  Second  number: below 80. Hypertension stage 1  First number: 130-139.  Second number: 80-89. Hypertension stage 2  First number: 140 or above.  Second number: 8 or above. Your blood pressure is above normal even if only the top or bottom number is above normal. Follow these instructions at home:  Check your blood pressure as often as your doctor tells you to.  Check your blood pressure at the same time every day.  Take your monitor to your next doctor's appointment. Your doctor will: ? Make sure you are using it correctly. ? Make sure it is working right.  Make sure you understand what your blood pressure numbers should be.  Tell your doctor if your medicine is causing side effects.  Keep all follow-up visits as told by your doctor. This is important. General tips:  You will need a blood pressure machine, or monitor. Your doctor can suggest a monitor. You can buy one at a drugstore or online. When choosing one: ? Choose one with an arm cuff. ? Choose one that wraps around your upper arm. Only one finger should fit between your arm and the cuff. ? Do not choose one that measures your blood pressure from your wrist or finger. Where to find more information American Heart Association: www.heart.org Contact a doctor if:  Your blood pressure keeps being high. Get help right away if:  Your first blood pressure number is higher than 180.  Your second blood pressure number is higher than 120. Summary  Check your blood pressure at the same time every day.  Avoid caffeine, alcohol, smoking, and exercise for 30 minutes before checking your blood pressure.  Make sure you understand what your blood pressure numbers should be. This information is not intended to replace advice given to you by your health care provider. Make sure you discuss any questions you have with your health care provider. Document Revised: 03/29/2019 Document Reviewed: 03/29/2019 Elsevier Patient Education   2021 [employer].

## 2020-09-10 NOTE — Progress Notes (Signed)
Follow-up Outpatient Visit Date: 09/10/2020  Primary Care Provider: [name], [address] [place] [address]  Chief Complaint: Elevated blood pressure  HPI:  Cameron Foster is a 80 y.o. male with history of hypertension, hyperlipidemia, isolated episode of atrial fibrillation in 1993/06/20, subarachnoid hemorrhage of uncertain etiology complicated by more recent stroke, prostate cancer, and alcohol abuse, who presents for urgent evaluation of elevated blood pressure.  He reached out to our office earlier this week concerned about elevated blood pressure readings ([phone]).  He had been seen in our office most recently in June 20, 2020 by Ignacia Bayley, at which time his blood pressure was well controlled.  Today, Cameron Foster reports that he has been feeling well.  He denies chest pain, shortness of breath, palpitations, or lightheadedness.  Chronic lower extremity edema is stable.  He began checking his blood pressure recently after was noted to be elevated at his vascular surgery follow-up.  Over the last week, blood pressures at home have been 155/84-165/94.  He reports being compliant with his medications.  --------------------------------------------------------------------------------------------------  Past Medical History:  Diagnosis Date  . Alcoholism ([place])   . Arthritis   . Atrial fibrillation ([place]) 1995   one episode  . Basal cell carcinoma 04/26/2017   Right medial cheek. Superficial and nodular  . Basal cell carcinoma 06/11/2019   Left anterior shoulder. Nodular pattern  . Basal cell carcinoma 09/09/2019   Right nasal ala, EDC  . Basal cell carcinoma 02/05/2020   L upper eyebrow, EDC   . Carotid arterial disease ([place])    a. 06/21/2019 Carotid U/S: <50% bilat ICA stenoses.  . Colon polyp   . Depression    Son died 2014/06/20  . Diverticulitis   . Diverticulosis 30 years  . GERD (gastroesophageal reflux disease)   . History of echocardiogram    a. 21-Jun-2019  Echo: EF 55-60%, no rwma, triv MR/AI.  Marland Kitchen History of stress test    a. 09/2015 MV: No ischemia/infarct. EF 45-54% (nl by echo).  . Hyperlipidemia   . Hypertension   . Irritable bowel syndrome   . Prostate cancer ([place]) June 20, 2010   treated with radiation therapy. Prostate  . PSVT (paroxysmal supraventricular tachycardia) ([place])    a. 06/2019 Zio: Avg rate 74 (54-120), occas PACs, rare PVCs, 125 episodes of PVCs (longest 17.5 secs; max rate 187). No afib.  Marland Kitchen Squamous cell carcinoma of skin 07/18/2017   Left medial calf. KA type  . Squamous cell carcinoma of skin 04/24/2018   Right above med. brow  . Squamous cell carcinoma of skin 06/11/2019   Right posterior shoulder. SCCis, hypertrophic  . Squamous cell carcinoma of skin 01/09/2020   Mid nasal dorsum, MOHS, Efudex x 4wks  . Vasovagal syncope    Past Surgical History:  Procedure Laterality Date  . [place] no stents  . CATARACT EXTRACTION, BILATERAL    . COLON RESECTION SIGMOID N/A 12/07/2016   Procedure: COLON RESECTION SIGMOID;  Surgeon: Clayburn Pert, MD;  Location: [facility] ORS;  Service: General;  Laterality: N/A;  . COLON SURGERY  11/2016   Colostomy  . COLONOSCOPY  June 20, 2014  . COLONOSCOPY WITH PROPOFOL N/A 05/30/2016   Procedure: COLONOSCOPY WITH PROPOFOL;  Surgeon: Lollie Sails, MD;  Location: [facility] ENDOSCOPY;  Service: Endoscopy;  Laterality: N/A;  . COLOSTOMY Left 12/07/2016   Procedure: COLOSTOMY;  Surgeon: Clayburn Pert, MD;  Location: [facility] ORS;  Service: General;  Laterality: Left;  . COLOSTOMY REVERSAL N/A  03/21/2017   Procedure: COLOSTOMY REVERSAL;  Surgeon: Clayburn Pert, MD;  Location: [facility] ORS;  Service: General;  Laterality: N/A;  . COLOSTOMY TAKEDOWN N/A 03/21/2017   Procedure: LAPAROSCOPIC COLOSTOMY TAKEDOWN;  Surgeon: Clayburn Pert, MD;  Location: [facility] ORS;  Service: General;  Laterality: N/A;  . CYSTOSCOPY WITH STENT PLACEMENT Bilateral 03/21/2017   Procedure: CYSTOSCOPY WITH  LIGHTED STENT PLACEMENT;  Surgeon: Abbie Sons, MD;  Location: [facility] ORS;  Service: Urology;  Laterality: Bilateral;  . ESOPHAGOGASTRODUODENOSCOPY    . ESOPHAGOGASTRODUODENOSCOPY N/A 05/30/2016   Procedure: ESOPHAGOGASTRODUODENOSCOPY (EGD);  Surgeon: Lollie Sails, MD;  Location: [facility] ENDOSCOPY;  Service: Endoscopy;  Laterality: N/A;  . EYE SURGERY     cataracts  . FLEXIBLE SIGMOIDOSCOPY N/A 03/21/2017   Procedure: FLEXIBLE SIGMOIDOSCOPY;  Surgeon: Clayburn Pert, MD;  Location: [facility] ORS;  Service: General;  Laterality: N/A;  . FRACTURE SURGERY Bilateral    right arm and left wrist  . INCISION AND DRAINAGE ABSCESS N/A 12/07/2016   Procedure: DRAINAGE  OF INTRA ABDOMINAL ABSCESS;  Surgeon: Clayburn Pert, MD;  Location: [facility] ORS;  Service: General;  Laterality: N/A;  . KNEE ARTHROSCOPY    . LAPAROTOMY N/A 12/07/2016   Procedure: EXPLORATORY LAPAROTOMY;  Surgeon: Clayburn Pert, MD;  Location: [facility] ORS;  Service: General;  Laterality: N/A;  . PROSTATE SURGERY     Microwave therapy  . TONSILLECTOMY    . TOTAL KNEE ARTHROPLASTY Right 07/27/2016   Procedure: TOTAL KNEE ARTHROPLASTY;  Surgeon: [name], MD;  Location: [facility] ORS;  Service: Orthopedics;  Laterality: Right;  Dr. Erlene Quan had to place Urinary catheter due to prostate cancer history.  Using flexible scope.    Current Meds  Medication Sig  . amLODipine (NORVASC) 5 MG tablet TAKE 1 TABLET BY MOUTH EVERY DAY  . aspirin 81 MG EC tablet Take 81 mg by mouth daily.   Marland Kitchen atorvastatin (LIPITOR) 40 MG tablet TAKE 1 TABLET BY MOUTH EVERY DAY  . Cyanocobalamin (VITAMIN B 12 PO) Take 1,000 mcg by mouth daily.  . furosemide (LASIX) 20 MG tablet TAKE 1 TABLET BY MOUTH EVERY DAY  . hydrocortisone 2.5 % cream APPLY TO AFFECTED AREA TWICE A DAY AS NEEDED FOR RASH  . lisinopril (ZESTRIL) 10 MG tablet TAKE 1 TABLET BY MOUTH EVERY DAY  . lisinopril (ZESTRIL) 40 MG tablet TAKE 1 TABLET BY MOUTH EVERY DAY  . meclizine (ANTIVERT) 25 MG  tablet Take 25 mg by mouth 2 (two) times daily as needed.  . meloxicam (MOBIC) 15 MG tablet Take 1 tablet (15 mg total) by mouth daily as needed for pain.  . metoprolol succinate (TOPROL-XL) 50 MG 24 hr tablet TAKE 1 TABLET BY MOUTH DAILY. TAKE WITH OR IMMEDIATELY FOLLOWING A MEAL.  . Multiple Vitamin (MULTIVITAMIN WITH MINERALS) TABS tablet Take 1 tablet by mouth daily.  Marland Kitchen omeprazole (PRILOSEC) 20 MG capsule TAKE 1 CAPSULE (20 MG TOTAL) BY MOUTH DAILY AS NEEDED (HEARTBURN).  Marland Kitchen sertraline (ZOLOFT) 100 MG tablet TAKE 1 TABLET BY MOUTH EVERY DAY  . sildenafil (REVATIO) 20 MG tablet Take 1 tablet (20 mg total) by mouth daily.    Allergies: Formaldehyde  Social History   Tobacco Use  . Smoking status: Former Smoker    Packs/day: 1.00    Years: 27.00    Pack years: 27.00    Types: Cigarettes    Quit date: 04/18/1988    Years since quitting: 32.4  . Smokeless tobacco: Current User    Types: Chew  Vaping Use  . Vaping  Use: Never used  Substance Use Topics  . Alcohol use: Yes    Alcohol/week: 7.0 standard drinks    Types: 7 Glasses of wine per week    Comment: weekly-Formal heavy use ETOH  . Drug use: No    Family History  Problem Relation Age of Onset  . Lung cancer Father        smoker  . Other Mother   . Sudden death Son        due to "Blood clots"  . Bipolar disorder Son   . Heart disease Son   . Kidney disease Daughter        congenital one small kidney  . Prostate cancer Neg Hx   . Bladder Cancer Neg Hx     Review of Systems: A 12-system review of systems was performed and was negative except as noted in the HPI.  --------------------------------------------------------------------------------------------------  Physical Exam: BP 110/80 (BP Location: Left Arm, Patient Position: Sitting, Cuff Size: Large)   Pulse 70   Ht 5' 8"  (1.727 m)   Wt 214 lb (97.1 kg)   SpO2 96%   BMI 32.54 kg/m   General:  NAD. Neck: No JVD or HJR. Lungs: Clear to auscultation  bilaterally without wheezes or crackles. Heart: Regular rate and rhythm without murmurs, rubs, or gallops. Abdomen: Soft, nontender, nondistended. Extremities: Trace to 1+ pretibial edema, right greater than the left.  EKG: Normal sinus rhythm with bifascicular block (LAFB and RBBB) and borderline LVH.  No change from prior tracing on 06/05/2020.  Lab Results  Component Value Date   WBC 8.5 03/14/2020   HGB 11.7 (L) 03/14/2020   HCT 36.3 (L) 03/14/2020   MCV 90.1 03/14/2020   PLT 199 03/14/2020    Lab Results  Component Value Date   NA 137 03/14/2020   K 3.6 03/14/2020   CL 102 03/14/2020   CO2 25 03/14/2020   BUN 14 03/14/2020   CREATININE 1.40 (H) 03/14/2020   GLUCOSE 118 (H) 03/14/2020   ALT 15 06/08/2019    Lab Results  Component Value Date   CHOL 156 08/15/2019   HDL 46 08/15/2019   LDLCALC 85 08/15/2019   TRIG 144 08/15/2019   CHOLHDL 4.4 06/09/2019    --------------------------------------------------------------------------------------------------  ASSESSMENT AND PLAN: Hypertension: Blood pressure in the office today is normal.  I am uncertain as to why Cameron Foster has been having higher readings at home, though this has been an issue in the past.  We question the accuracy of his home blood pressure cuff.  We will defer medication changes today.  I have encouraged Cameron Foster to bring his home blood pressure cuff with him to his next visit with [place] or another provider in order to assess its accuracy.  Chronic lower extremity edema: Stable.  Continue follow-up with vascular surgery.  Paroxysmal atrial fibrillation: Only a single episode noted greater than 10 years ago.  Given history of unprovoked subarachnoid hemorrhage, we will continue to defer anticoagulation.  Follow-up: Return to clinic as previously scheduled in 11/2020.  [name], MD 09/11/2020 1:30 PM

## 2020-09-11 ENCOUNTER — Telehealth: Payer: Self-pay | Admitting: Internal Medicine

## 2020-09-11 ENCOUNTER — Encounter: Payer: Self-pay | Admitting: Internal Medicine

## 2020-09-11 NOTE — Telephone Encounter (Signed)
Patient states during his visit yesterday he misplaced his cell phone. He would like to know if anyone found it at the office. He states he tried calling the [place] office, but he hasn't gotten an answer. Please advise.

## 2020-09-11 NOTE — Telephone Encounter (Signed)
Spoke with patient and he dropped some stuff from his back pocket. Reviewed that when I walked him out I went back and cleaned room and there was nothing in the room. Provided him with security number in case he dropped it outside of this office. He was appreciative for the call back with no further questions at this time.

## 2020-09-17 ENCOUNTER — Ambulatory Visit (INDEPENDENT_AMBULATORY_CARE_PROVIDER_SITE_OTHER): Payer: Medicare Other | Admitting: Family Medicine

## 2020-09-17 ENCOUNTER — Encounter: Payer: Self-pay | Admitting: Family Medicine

## 2020-09-17 ENCOUNTER — Other Ambulatory Visit: Payer: Self-pay

## 2020-09-17 VITALS — BP 116/73 | HR 88 | Temp 98.6°F | Wt 214.0 lb

## 2020-09-17 DIAGNOSIS — Z8546 Personal history of malignant neoplasm of prostate: Secondary | ICD-10-CM | POA: Diagnosis not present

## 2020-09-17 DIAGNOSIS — D518 Other vitamin B12 deficiency anemias: Secondary | ICD-10-CM

## 2020-09-17 DIAGNOSIS — R5383 Other fatigue: Secondary | ICD-10-CM

## 2020-09-17 DIAGNOSIS — I10 Essential (primary) hypertension: Secondary | ICD-10-CM | POA: Diagnosis not present

## 2020-09-17 NOTE — Progress Notes (Signed)
Established patient visit   Patient: Cameron Foster.   DOB: 04-Mar-1941   80 y.o. Male  MRN: [id number] Visit Date: 09/17/2020  Today's healthcare provider: Vernie Murders, PA-C   Chief Complaint  Patient presents with   Hypertension   Subjective    HPI  Hypertension, follow-up  BP Readings from Last 3 Encounters:  09/17/20 116/73  09/10/20 110/80  09/09/20 (!) 145/78   Wt Readings from Last 3 Encounters:  09/17/20 214 lb (97.1 kg)  09/10/20 214 lb (97.1 kg)  09/09/20 200 lb (90.7 kg)     He was last seen for hypertension 4 months ago.   He reports excellent compliance with treatment. He is not having side effects.  He is following a Low Sodium diet. He is not exercising. He does not smoke.  Use of agents associated with hypertension: none.   Outside blood pressures are have been running high. Symptoms: No chest pain No chest pressure  No palpitations No syncope  No dyspnea No orthopnea  No paroxysmal nocturnal dyspnea No lower extremity edema   Pertinent labs: Lab Results  Component Value Date   CHOL 156 08/15/2019   HDL 46 08/15/2019   LDLCALC 85 08/15/2019   TRIG 144 08/15/2019   CHOLHDL 4.4 06/09/2019   Lab Results  Component Value Date   NA 137 03/14/2020   K 3.6 03/14/2020   CREATININE 1.40 (H) 03/14/2020   GFRNONAA 51 (L) 03/14/2020   GFRAA 62 08/15/2019   GLUCOSE 118 (H) 03/14/2020     The ASCVD Risk score (Goff DC Jr., et al., 2013) failed to calculate for the following reasons:   The 2013 ASCVD risk score is only valid for ages 66 to 5   The patient has a prior MI or stroke diagnosis   ---------------------------------------------------------------------------------------------------   Patient Active Problem List   Diagnosis Date Noted   Chronic pain of right ankle 05/21/2020   Encounter for attention to colostomy ([place]) 05/21/2020   Achilles tendinitis 04/30/2020   Synovitis and tenosynovitis 04/30/2020   Lymphedema  03/03/2020   Chronic venous insufficiency 03/03/2020   Fatigue 02/14/2020   Leg edema 02/14/2020   Foraminal stenosis of lumbar region 11/08/2019   Chronic bilateral low back pain without sciatica 10/18/2019   Foraminal stenosis of cervical region 10/18/2019   PSVT (paroxysmal supraventricular tachycardia) ([place]) 08/01/2019   Lacunar infarction ([place]) 07/10/2019   History of lacunar cerebrovascular accident (CVA) 07/01/2019   Cerebrovascular accident (CVA) ([place]) 06/08/2019   Paroxysmal atrial fibrillation ([place]) 11/07/2018   TIA (transient ischemic attack) 10/20/2018   History of subarachnoid hemorrhage 10/16/2018   Recurrent sinusitis 10/16/2018   Low libido 08/30/2018   Obesity 05/14/2018   Hyperlipidemia LDL goal <70 05/14/2018   Degeneration of lumbar intervertebral disc 03/22/2018   Osteoarthritis of hip 03/22/2018   Trochanteric bursitis of right hip 03/22/2018   Status post partial colectomy 01/03/2017   Depression, major, single episode, mild ([place]) 01/03/2017   Vasovagal syncope 01/27/2016   History of skin cancer 10/28/2015   Osteoarthritis of right knee 10/01/2015   Essential hypertension 08/25/2015   Dyspnea on exertion 08/12/2015   Erectile dysfunction following radiation therapy 08/04/2015   Constipation 08/02/2015   History of prostate cancer 01/20/2015   Swelling of right lower extremity 01/20/2015   Past Medical History:  Diagnosis Date   Alcoholism ([place])    Arthritis    Atrial fibrillation ([place]) 1995   one episode   Basal cell carcinoma 04/26/2017  Right medial cheek. Superficial and nodular   Basal cell carcinoma 06/11/2019   Left anterior shoulder. Nodular pattern   Basal cell carcinoma 09/09/2019   Right nasal ala, EDC   Basal cell carcinoma 02/05/2020   L upper eyebrow, EDC    Carotid arterial disease ([place])    a. 2019-06-21 Carotid U/S: <50% bilat ICA stenoses.   Colon polyp    Depression    Son died 06-20-14   Diverticulitis    Diverticulosis 30 years    GERD (gastroesophageal reflux disease)    History of echocardiogram    a. 21-Jun-2019 Echo: EF 55-60%, no rwma, triv MR/AI.   History of stress test    a. 09/2015 MV: No ischemia/infarct. EF 45-54% (nl by echo).   Hyperlipidemia    Hypertension    Irritable bowel syndrome    Prostate cancer ([place]) June 20, 2010   treated with radiation therapy. Prostate   PSVT (paroxysmal supraventricular tachycardia) ([place])    a. 06/2019 Zio: Avg rate 74 (54-120), occas PACs, rare PVCs, 125 episodes of PVCs (longest 17.5 secs; max rate 187). No afib.   Squamous cell carcinoma of skin 07/18/2017   Left medial calf. KA type   Squamous cell carcinoma of skin 04/24/2018   Right above med. brow   Squamous cell carcinoma of skin 06/11/2019   Right posterior shoulder. SCCis, hypertrophic   Squamous cell carcinoma of skin 01/09/2020   Mid nasal dorsum, MOHS, Efudex x 4wks   Vasovagal syncope    Social History   Tobacco Use   Smoking status: Former Smoker    Packs/day: 1.00    Years: 27.00    Pack years: 27.00    Types: Cigarettes    Quit date: 04/18/1988    Years since quitting: 32.4   Smokeless tobacco: Current User    Types: Chew  Vaping Use   Vaping Use: Never used  Substance Use Topics   Alcohol use: Yes    Alcohol/week: 7.0 standard drinks    Types: 7 Glasses of wine per week    Comment: weekly-Formal heavy use ETOH   Drug use: No   Allergies  Allergen Reactions   Formaldehyde Rash     Medications: Outpatient Medications Prior to Visit  Medication Sig   amLODipine (NORVASC) 5 MG tablet TAKE 1 TABLET BY MOUTH EVERY DAY   aspirin 81 MG EC tablet Take 81 mg by mouth daily.    atorvastatin (LIPITOR) 40 MG tablet TAKE 1 TABLET BY MOUTH EVERY DAY   Cyanocobalamin (VITAMIN B 12 PO) Take 1,000 mcg by mouth daily.   furosemide (LASIX) 20 MG tablet TAKE 1 TABLET BY MOUTH EVERY DAY   hydrocortisone 2.5 % cream APPLY TO AFFECTED AREA TWICE A DAY AS NEEDED FOR RASH   lisinopril (ZESTRIL) 40 MG tablet TAKE 1  TABLET BY MOUTH EVERY DAY   meclizine (ANTIVERT) 25 MG tablet Take 25 mg by mouth 2 (two) times daily as needed.   meloxicam (MOBIC) 15 MG tablet Take 1 tablet (15 mg total) by mouth daily as needed for pain.   metoprolol succinate (TOPROL-XL) 50 MG 24 hr tablet TAKE 1 TABLET BY MOUTH DAILY. TAKE WITH OR IMMEDIATELY FOLLOWING A MEAL.   Multiple Vitamin (MULTIVITAMIN WITH MINERALS) TABS tablet Take 1 tablet by mouth daily.   omeprazole (PRILOSEC) 20 MG capsule TAKE 1 CAPSULE (20 MG TOTAL) BY MOUTH DAILY AS NEEDED (HEARTBURN).   sertraline (ZOLOFT) 100 MG tablet TAKE 1 TABLET BY MOUTH EVERY DAY   sildenafil (REVATIO) 20 MG  tablet Take 1 tablet (20 mg total) by mouth daily.   lisinopril (ZESTRIL) 10 MG tablet TAKE 1 TABLET BY MOUTH EVERY DAY   No facility-administered medications prior to visit.    Review of Systems  Constitutional: Negative.   Respiratory: Negative.    Cardiovascular: Negative.   Gastrointestinal: Negative.   Neurological:  Negative for dizziness, syncope, weakness, light-headedness and headaches.      Objective    BP 116/73 (BP Location: Right Arm, Patient Position: Sitting, Cuff Size: Large)   Pulse 88   Temp 98.6 F (37 C) (Oral)   Wt 214 lb (97.1 kg)   BMI 32.54 kg/m     Physical Exam Constitutional:      General: He is not in acute distress.    Appearance: He is well-developed.  HENT:     Head: Normocephalic and atraumatic.     Right Ear: Hearing normal.     Left Ear: Hearing normal.     Nose: Nose normal.  Eyes:     General: Lids are normal. No scleral icterus.       Right eye: No discharge.        Left eye: No discharge.     Conjunctiva/sclera: Conjunctivae normal.  Cardiovascular:     Rate and Rhythm: Normal rate and regular rhythm.     Pulses: Normal pulses.     Heart sounds: Normal heart sounds.  Pulmonary:     Effort: Pulmonary effort is normal. No respiratory distress.     Breath sounds: Normal breath sounds.  Abdominal:     General:  Bowel sounds are normal.     Palpations: Abdomen is soft.  Musculoskeletal:        General: Normal range of motion.  Skin:    Findings: No lesion or rash.  Neurological:     Mental Status: He is alert and oriented to person, place, and time.  Psychiatric:        Speech: Speech normal.        Behavior: Behavior normal.        Thought Content: Thought content normal.     No results found for any visits on 09/17/20.  Assessment & Plan     1. Fatigue, unspecified type Has reported fatigue to cardiologist and others since October 2021. No palpitations, chest pains, dyspnea, hematemesis or melena. Will recheck anemia (low hemoglobin and B12 in 2020). - Ferritin - CBC with Differential/Platelet - Comprehensive metabolic panel - K02 and Folate Panel  2. Essential hypertension Well controlled BP on the Amlodipine 5 mg qd, Lisinopril 50 mg qd and Metoprolol Succinate 50 mg qd. Followed by Dr. Saunders Revel (cardiologist) on 09-10-20. Recheck routine labs. - CBC with Differential/Platelet - Comprehensive metabolic panel  3. History of prostate cancer Followed by Dr. Bernardo Heater (urologist) on 09-09-20. PSA undetectable at that time. - Ferritin - CBC with Differential/Platelet - Comprehensive metabolic panel - R42 and Folate Panel  4. Other vitamin B12 deficiency anemia Low hgb nn 2021 and has been on Vitamin B12 since 2020. Recheck CBC, B12, Folate and Ferritin levels. - Ferritin - CBC with Differential/Platelet - B12 and Folate Panel   No follow-ups on file.      I, Dotty Gonzalo, PA-C, have reviewed all documentation for this visit. The documentation on 10/05/20 for the exam, diagnosis, procedures, and orders are all accurate and complete.    Vernie Murders, PA-C  [employer] [phone] (phone) [phone] (fax)  [place]

## 2020-09-24 DIAGNOSIS — I10 Essential (primary) hypertension: Secondary | ICD-10-CM | POA: Diagnosis not present

## 2020-09-24 DIAGNOSIS — R7309 Other abnormal glucose: Secondary | ICD-10-CM | POA: Diagnosis not present

## 2020-09-24 DIAGNOSIS — Z8546 Personal history of malignant neoplasm of prostate: Secondary | ICD-10-CM | POA: Diagnosis not present

## 2020-09-24 DIAGNOSIS — D518 Other vitamin B12 deficiency anemias: Secondary | ICD-10-CM | POA: Diagnosis not present

## 2020-09-24 DIAGNOSIS — R5383 Other fatigue: Secondary | ICD-10-CM | POA: Diagnosis not present

## 2020-09-25 LAB — COMPREHENSIVE METABOLIC PANEL
ALT: 12 IU/L (ref 0–44)
AST: 18 IU/L (ref 0–40)
Albumin/Globulin Ratio: 1.5 (ref 1.2–2.2)
Albumin: 4.1 g/dL (ref 3.7–4.7)
Alkaline Phosphatase: 88 IU/L (ref 44–121)
BUN/Creatinine Ratio: 13 (ref 10–24)
BUN: 19 mg/dL (ref 8–27)
Bilirubin Total: 0.5 mg/dL (ref 0.0–1.2)
CO2: 21 mmol/L (ref 20–29)
Calcium: 9.6 mg/dL (ref 8.6–10.2)
Chloride: 104 mmol/L (ref 96–106)
Creatinine, Ser: 1.41 mg/dL — ABNORMAL HIGH (ref 0.76–1.27)
Globulin, Total: 2.8 g/dL (ref 1.5–4.5)
Glucose: 177 mg/dL — ABNORMAL HIGH (ref 65–99)
Potassium: 3.6 mmol/L (ref 3.5–5.2)
Sodium: 142 mmol/L (ref 134–144)
Total Protein: 6.9 g/dL (ref 6.0–8.5)
eGFR: 50 mL/min/{1.73_m2} — ABNORMAL LOW (ref >59)

## 2020-09-25 LAB — CBC WITH DIFFERENTIAL/PLATELET
Basophils Absolute: 0 10*3/uL (ref 0.0–0.2)
Basos: 1 %
EOS (ABSOLUTE): 0.3 10*3/uL (ref 0.0–0.4)
Eos: 4 %
Hematocrit: 38.3 % (ref 37.5–51.0)
Hemoglobin: 12.5 g/dL — ABNORMAL LOW (ref 13.0–17.7)
Immature Grans (Abs): 0 10*3/uL (ref 0.0–0.1)
Immature Granulocytes: 1 %
Lymphocytes Absolute: 1.2 10*3/uL (ref 0.7–3.1)
Lymphs: 16 %
MCH: 29.5 pg (ref 26.6–33.0)
MCHC: 32.6 g/dL (ref 31.5–35.7)
MCV: 90 fL (ref 79–97)
Monocytes Absolute: 0.5 10*3/uL (ref 0.1–0.9)
Monocytes: 6 %
Neutrophils Absolute: 5.6 10*3/uL (ref 1.4–7.0)
Neutrophils: 72 %
Platelets: 234 10*3/uL (ref 150–450)
RBC: 4.24 x10E6/uL (ref 4.14–5.80)
RDW: 12.5 % (ref 11.6–15.4)
WBC: 7.7 10*3/uL (ref 3.4–10.8)

## 2020-09-25 LAB — B12 AND FOLATE PANEL
Folate: 32.8 ng/mL (ref >3.0)
Vitamin B-12: 444 pg/mL (ref 232–1245)

## 2020-09-25 LAB — FERRITIN: Ferritin: 84 ng/mL (ref 30–400)

## 2020-09-28 ENCOUNTER — Telehealth: Payer: Self-pay

## 2020-09-28 NOTE — Telephone Encounter (Signed)
Pt is calling to receive his lab results. Pt states that he his home now. [phone]

## 2020-09-28 NOTE — Telephone Encounter (Signed)
Copied from [place] [id number]. Topic: General - Inquiry >> Sep 28, 2020 10:08 AM Greggory Keen D wrote: Reason for CRM: Pt called saying he read his lab results on my chart  And would like someone to call him back to discuss  [phone]

## 2020-10-01 ENCOUNTER — Encounter: Payer: Self-pay | Admitting: Family Medicine

## 2020-10-01 LAB — HGB A1C W/O EAG: Hgb A1c MFr Bld: 5.4 % (ref 4.8–5.6)

## 2020-10-01 LAB — SPECIMEN STATUS REPORT

## 2020-10-15 ENCOUNTER — Other Ambulatory Visit: Payer: Self-pay | Admitting: Family Medicine

## 2020-10-15 NOTE — Telephone Encounter (Signed)
Requested medication (s) are due for refill today: yes  Requested medication (s) are on the active medication list:  yes  Last refill:  08/10/2020  Future visit scheduled: yes  Notes to clinic:  Medication not assigned to a protocol, review manually   Requested Prescriptions  Pending Prescriptions Disp Refills   hydrocortisone 2.5 % cream [Pharmacy Med Name: HYDROCORTISONE 2.5% CREAM] 28 g 12    Sig: APPLY TO AFFECTED AREA TWICE A DAY AS NEEDED FOR RASH      Off-Protocol Failed - 10/15/2020 11:42 AM      Failed - Medication not assigned to a protocol, review manually.      Passed - Valid encounter within last 12 months    Recent Outpatient Visits           4 weeks ago Fatigue, unspecified type   [place], Vickki Muff, PA-C   4 months ago Essential hypertension   [employer] [facility], Dionne Bucy, MD   8 months ago Essential hypertension   [facility] [place], Dionne Bucy, MD   9 months ago Swelling of right lower extremity   [facility], Dionne Bucy, MD   10 months ago Essential hypertension   [place], Dionne Bucy, MD       Future Appointments             In 1 month Bacigalupo, Dionne Bucy, MD [facility], [place]   In 1 month End, Harrell Gave, MD [facility], [place]   In 4 months [name], MD [place]              Signed Prescriptions Disp Refills   lisinopril (ZESTRIL) 10 MG tablet 90 tablet 0    Sig: TAKE 1 TABLET BY MOUTH EVERY DAY      Cardiovascular:  ACE Inhibitors Failed - 10/15/2020 11:42 AM      Failed - Cr in normal range and within 180 days    Creat  Date Value Ref Range Status  08/12/2015 1.40 (H) 0.70 - 1.18 mg/dL Final   Creatinine, Ser  Date Value Ref Range Status  09/24/2020 1.41 (H) 0.76 - 1.27 mg/dL Final          Passed - K in normal range and within 180 days    Potassium  Date Value  Ref Range Status  09/24/2020 3.6 3.5 - 5.2 mmol/L Final          Passed - Patient is not pregnant      Passed - Last BP in normal range    BP Readings from Last 1 Encounters:  09/17/20 116/73          Passed - Valid encounter within last 6 months    Recent Outpatient Visits           4 weeks ago Fatigue, unspecified type   [place], Vickki Muff, PA-C   4 months ago Essential hypertension   [facility] [place], Dionne Bucy, MD   8 months ago Essential hypertension   [facility] [place], Dionne Bucy, MD   9 months ago Swelling of right lower extremity   [facility] [place], Dionne Bucy, MD   10 months ago Essential hypertension   [place], Dionne Bucy, MD       Future Appointments             In 1 month Bacigalupo, Dionne Bucy, MD [facility], [place]   In 1 month End, Harrell Gave, MD [facility]   In 4 months [name], MD [place]

## 2020-11-19 ENCOUNTER — Other Ambulatory Visit: Payer: Self-pay | Admitting: Internal Medicine

## 2020-11-23 ENCOUNTER — Other Ambulatory Visit: Payer: Self-pay

## 2020-11-23 ENCOUNTER — Encounter: Payer: Self-pay | Admitting: Family Medicine

## 2020-11-23 ENCOUNTER — Telehealth (INDEPENDENT_AMBULATORY_CARE_PROVIDER_SITE_OTHER): Payer: Medicare Other | Admitting: Family Medicine

## 2020-11-23 DIAGNOSIS — I890 Lymphedema, not elsewhere classified: Secondary | ICD-10-CM | POA: Diagnosis not present

## 2020-11-23 DIAGNOSIS — E669 Obesity, unspecified: Secondary | ICD-10-CM | POA: Diagnosis not present

## 2020-11-23 DIAGNOSIS — D638 Anemia in other chronic diseases classified elsewhere: Secondary | ICD-10-CM | POA: Insufficient documentation

## 2020-11-23 DIAGNOSIS — F320 Major depressive disorder, single episode, mild: Secondary | ICD-10-CM

## 2020-11-23 DIAGNOSIS — N1831 Chronic kidney disease, stage 3a: Secondary | ICD-10-CM | POA: Diagnosis not present

## 2020-11-23 DIAGNOSIS — E785 Hyperlipidemia, unspecified: Secondary | ICD-10-CM

## 2020-11-23 DIAGNOSIS — Z6832 Body mass index (BMI) 32.0-32.9, adult: Secondary | ICD-10-CM

## 2020-11-23 DIAGNOSIS — D649 Anemia, unspecified: Secondary | ICD-10-CM | POA: Insufficient documentation

## 2020-11-23 MED ORDER — SERTRALINE HCL 50 MG PO TABS: 50.0000 mg | ORAL_TABLET | Freq: Every day | ORAL | 1 refills | Status: DC

## 2020-11-23 NOTE — Patient Instructions (Addendum)
Taper Zoloft to 50 mg daily for 2-4 weeks, then 34m every other day for 2-4 weeks, then stop  Come by for labs in 1-2 months. Fasting, preferably.

## 2020-11-23 NOTE — Assessment & Plan Note (Signed)
Previously well controlled Continue statin Repeat FLP and CMP Goal LDL < 70 

## 2020-11-23 NOTE — Progress Notes (Signed)
MyChart Video Visit    Virtual Visit via Video Note   This visit type was conducted due to national recommendations for restrictions regarding the COVID-19 Pandemic (e.g. social distancing) in an effort to limit this patient's exposure and mitigate transmission in our community. This patient is at least at moderate risk for complications without adequate follow up. This format is felt to be most appropriate for this patient at this time. Physical exam was limited by quality of the video and audio technology used for the visit.    Patient location: home Provider location: [place] involved in the visit: patient, provider   I discussed the limitations of evaluation and management by telemedicine and the availability of in person appointments. The patient expressed understanding and agreed to proceed.  Patient: Cameron Foster.   DOB: [birth year]   80 y.o. Male  MRN: [id number] Visit Date: 11/23/2020  Today's healthcare provider: Lavon Paganini, MD   Chief Complaint  Patient presents with   Depression   Hyperlipidemia   Anemia   Subjective    HPI  Depression, Follow-up  He  was last seen for this 6 months ago. Changes made at last visit include none. Continue Zoloft.   He reports excellent compliance with treatment. He is not having side effects.   He reports excellent tolerance of treatment. Current symptoms include:  none He feels he is Improved since last visit.  Depression screen [facility] 2/9 09/17/2020 05/18/2020 01/14/2020  Decreased Interest 0 0 0  Down, Depressed, Hopeless 0 0 0  PHQ - 2 Score 0 0 0  Altered sleeping 0 - 0  Tired, decreased energy 3 - 0  Change in appetite 0 - 0  Feeling bad or failure about yourself  0 - 0  Trouble concentrating 0 - 0  Moving slowly or fidgety/restless 0 - 0  Suicidal thoughts 0 - 0  PHQ-9 Score 3 - 0  Difficult doing work/chores Not difficult at all - Not difficult at all  Some recent data might be  hidden    -----------------------------------------------------------------------------------------     Medications: Outpatient Medications Prior to Visit  Medication Sig   amLODipine (NORVASC) 5 MG tablet TAKE 1 TABLET BY MOUTH EVERY DAY   aspirin 81 MG EC tablet Take 81 mg by mouth daily.    atorvastatin (LIPITOR) 40 MG tablet TAKE 1 TABLET BY MOUTH EVERY DAY   Cyanocobalamin (VITAMIN B 12 PO) Take 1,000 mcg by mouth daily.   furosemide (LASIX) 20 MG tablet TAKE 1 TABLET BY MOUTH EVERY DAY   hydrocortisone 2.5 % cream APPLY TO AFFECTED AREA TWICE A DAY AS NEEDED FOR RASH   lisinopril (ZESTRIL) 10 MG tablet TAKE 1 TABLET BY MOUTH EVERY DAY   lisinopril (ZESTRIL) 40 MG tablet TAKE 1 TABLET BY MOUTH EVERY DAY   meclizine (ANTIVERT) 25 MG tablet Take 25 mg by mouth 2 (two) times daily as needed.   meloxicam (MOBIC) 15 MG tablet Take 1 tablet (15 mg total) by mouth daily as needed for pain.   metoprolol succinate (TOPROL-XL) 50 MG 24 hr tablet TAKE 1 TABLET BY MOUTH DAILY. TAKE WITH OR IMMEDIATELY FOLLOWING A MEAL.   Multiple Vitamin (MULTIVITAMIN WITH MINERALS) TABS tablet Take 1 tablet by mouth daily.   omeprazole (PRILOSEC) 20 MG capsule TAKE 1 CAPSULE (20 MG TOTAL) BY MOUTH DAILY AS NEEDED (HEARTBURN).   sildenafil (REVATIO) 20 MG tablet Take 1 tablet (20 mg total) by mouth daily.   [DISCONTINUED] sertraline (ZOLOFT)  100 MG tablet TAKE 1 TABLET BY MOUTH EVERY DAY   No facility-administered medications prior to visit.    Review of Systems  Constitutional:  Negative for appetite change, chills and fever.  Respiratory:  Negative for chest tightness, shortness of breath and wheezing.   Cardiovascular:  Negative for chest pain and palpitations.  Gastrointestinal:  Negative for abdominal pain, nausea and vomiting.     Objective    There were no vitals taken for this visit.   Physical Exam Constitutional:      General: He is not in acute distress.    Appearance: Normal  appearance. He is not diaphoretic.  HENT:     Head: Normocephalic.  Eyes:     Conjunctiva/sclera: Conjunctivae normal.  Pulmonary:     Effort: Pulmonary effort is normal. No respiratory distress.  Neurological:     Mental Status: He is alert and oriented to person, place, and time. Mental status is at baseline.       Assessment & Plan     Problem List Items Addressed This Visit       Genitourinary   Stage 3a chronic kidney disease ([place])    Chronic and stable on recent labs Avoid nephrotoxic meds         Other   Depression, major, single episode, mild ([place]) - Primary    Chronic and well controlled Will attempt to taper zoloft       Relevant Medications   sertraline (ZOLOFT) 50 MG tablet   Obesity    Discussed importance of healthy weight management Discussed diet and exercise        Hyperlipidemia LDL goal <70    Previously well controlled Continue statin Repeat FLP and CMP Goal LDL < 70       Relevant Orders   Lipid panel   Lymphedema    Followed by vascular       Anemia    Improving on last labs Mild, likely 2/2 CKD Repeat CBC in 1-2 months Continue OTC iron supplement       Relevant Orders   CBC     Return in about 6 months (around 05/26/2021) for chronic disease f/u, AWV.     I discussed the assessment and treatment plan with the patient. The patient was provided an opportunity to ask questions and all were answered. The patient agreed with the plan and demonstrated an understanding of the instructions.   The patient was advised to call back or seek an in-person evaluation if the symptoms worsen or if the condition fails to improve as anticipated.  I, Lavon Paganini, MD, have reviewed all documentation for this visit. The documentation on 11/23/20 for the exam, diagnosis, procedures, and orders are all accurate and complete.   Hillary Struss, Dionne Bucy, MD, MPH [facility]

## 2020-11-23 NOTE — Assessment & Plan Note (Signed)
Followed by vascular

## 2020-11-23 NOTE — Assessment & Plan Note (Signed)
Chronic and stable on recent labs Avoid nephrotoxic meds

## 2020-11-23 NOTE — Assessment & Plan Note (Signed)
Improving on last labs Mild, likely 2/2 CKD Repeat CBC in 1-2 months Continue OTC iron supplement

## 2020-11-23 NOTE — Assessment & Plan Note (Signed)
Chronic and well controlled Will attempt to taper zoloft

## 2020-11-23 NOTE — Assessment & Plan Note (Signed)
Discussed importance of healthy weight management Discussed diet and exercise  

## 2020-11-25 ENCOUNTER — Other Ambulatory Visit: Payer: Self-pay

## 2020-11-25 ENCOUNTER — Encounter: Payer: Self-pay | Admitting: Emergency Medicine

## 2020-11-25 ENCOUNTER — Ambulatory Visit (INDEPENDENT_AMBULATORY_CARE_PROVIDER_SITE_OTHER): Payer: Medicare Other

## 2020-11-25 ENCOUNTER — Ambulatory Visit
Admission: EM | Admit: 2020-11-25 | Discharge: 2020-11-25 | Disposition: A | Discharge status: Home / Self Care | Payer: Medicare Other | Attending: Emergency Medicine | Admitting: Emergency Medicine

## 2020-11-25 DIAGNOSIS — R059 Cough, unspecified: Secondary | ICD-10-CM

## 2020-11-25 DIAGNOSIS — J069 Acute upper respiratory infection, unspecified: Secondary | ICD-10-CM | POA: Diagnosis not present

## 2020-11-25 DIAGNOSIS — Z1152 Encounter for screening for COVID-19: Secondary | ICD-10-CM

## 2020-11-25 IMAGING — DX DG CHEST 2V
2 series · 2 of 2 positions shown · non-contrast
Comparison: CT Chest, Abdomen, and Pelvis [DATE], and earlier.

CLINICAL DATA: 80-year-old male with productive cough for 3 days.

EXAM:
CHEST - 2 VIEW

[chest pa]
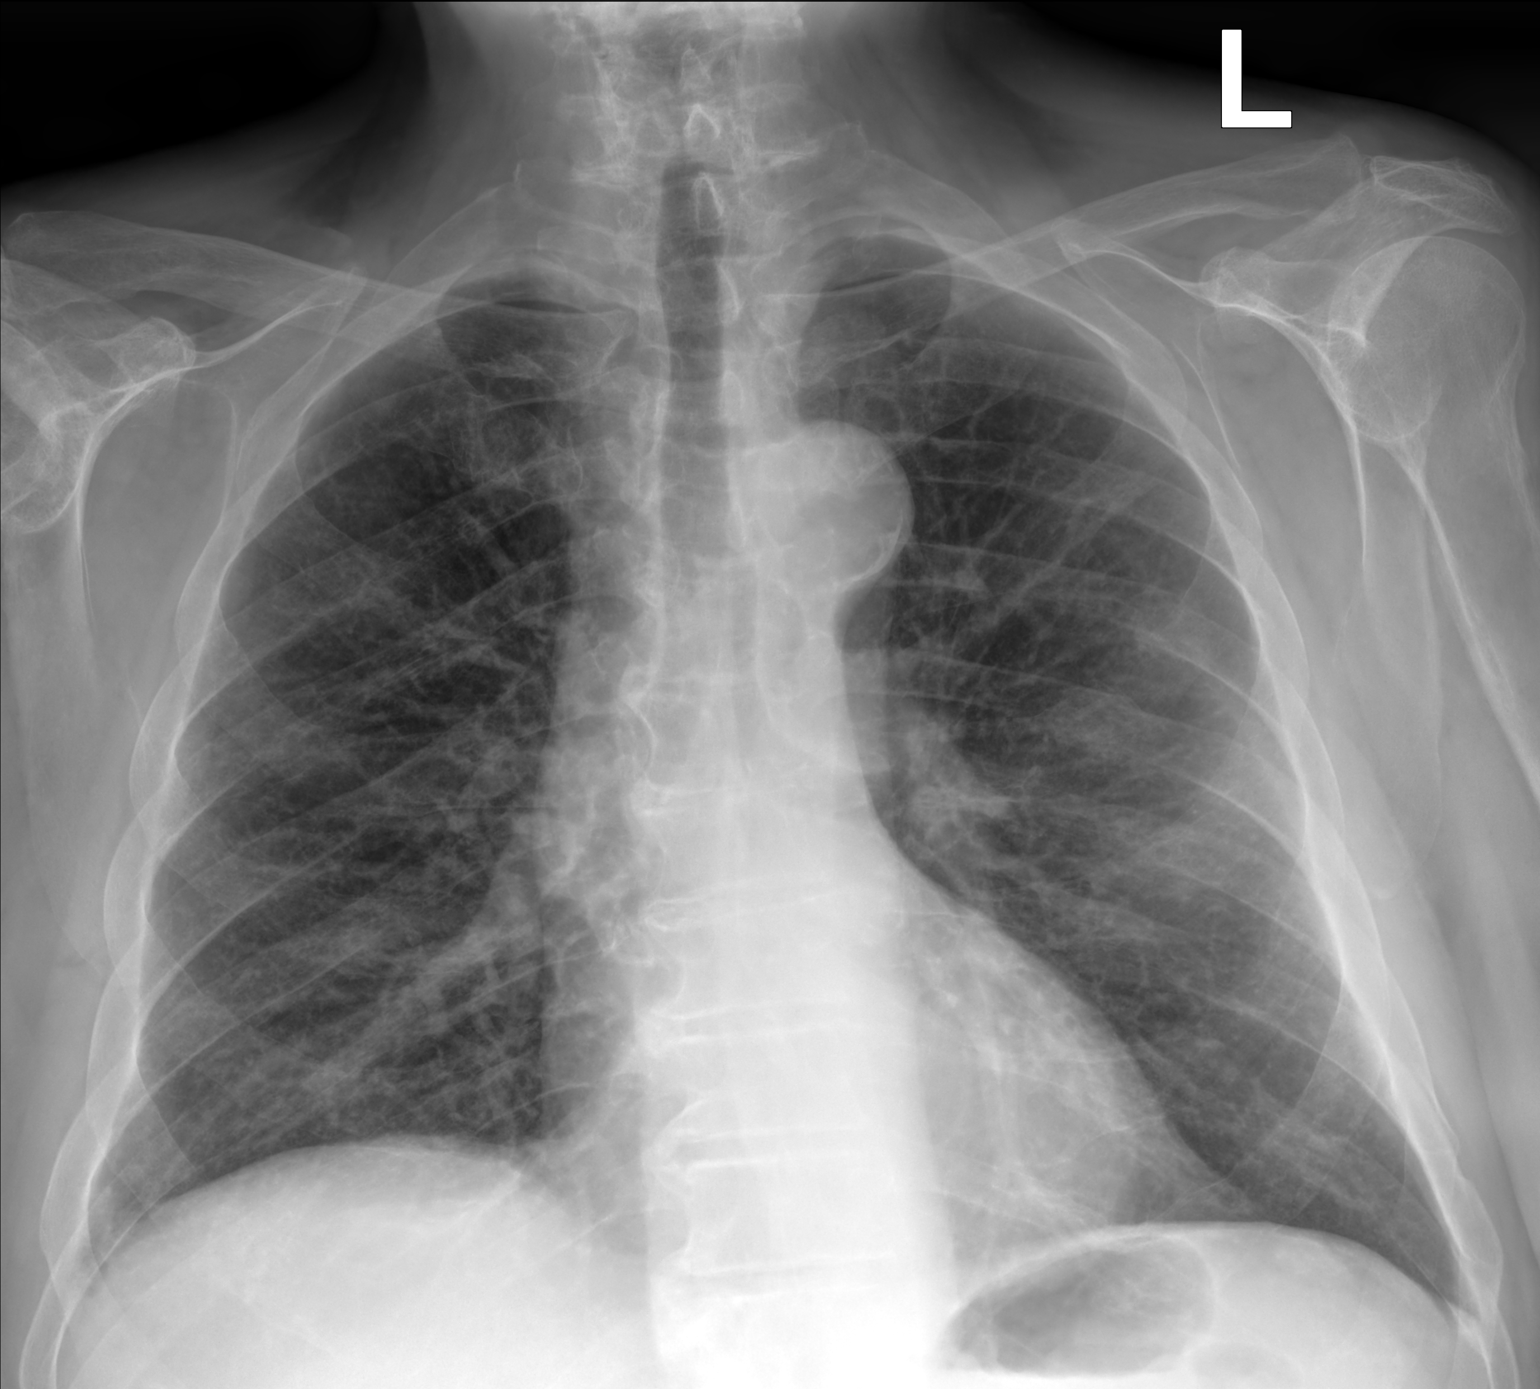

[chest lat]
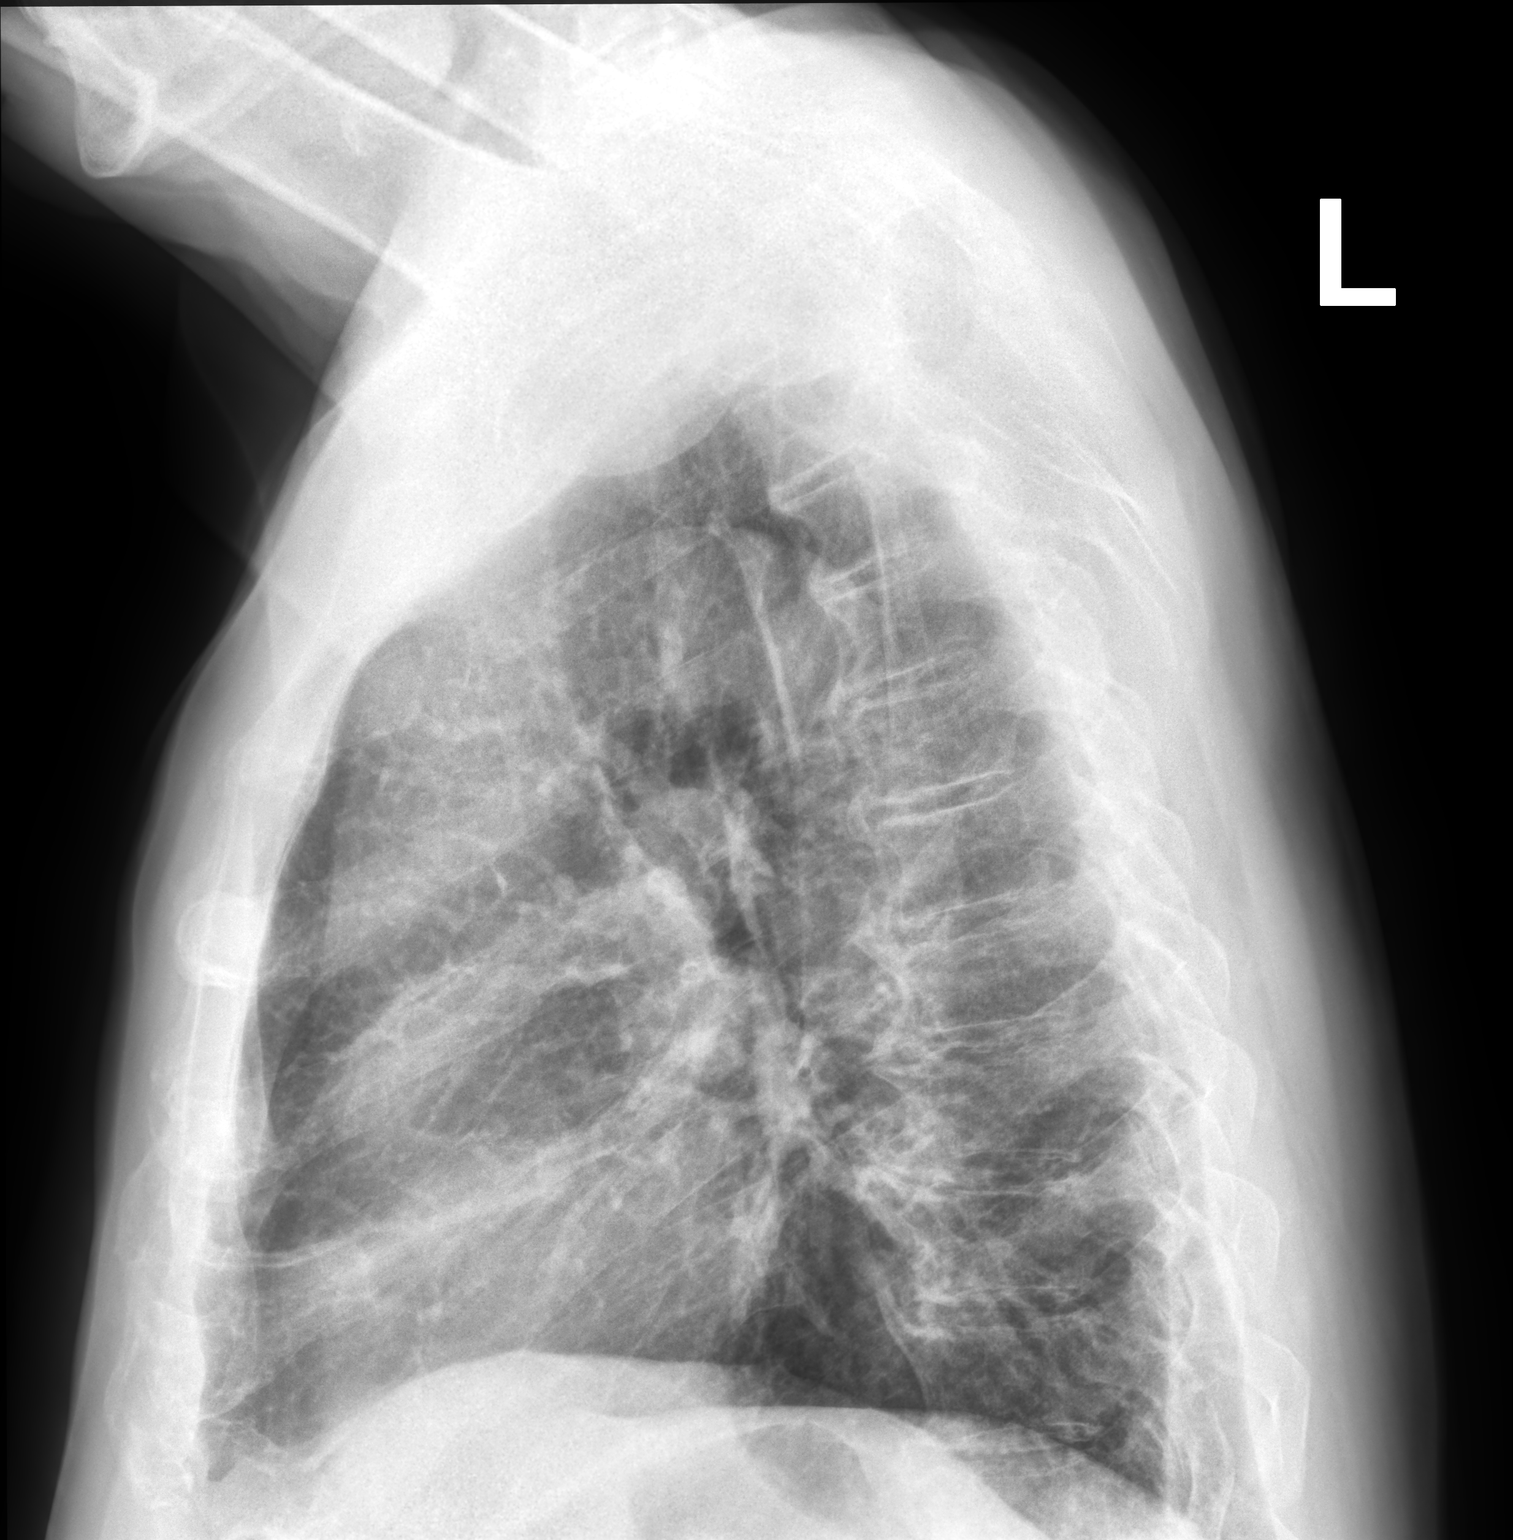

[2 of 2 positions shown; findings below may reference images not displayed]

FINDINGS: PA and lateral views. Lung volumes and mediastinal contours are
stable and within normal limits. Visualized tracheal air column is
within normal limits. Stable lung markings since last year. No
pneumothorax, pulmonary edema, pleural effusion or acute pulmonary
opacity.

Posterior left 5th rib fracture redemonstrated, and chronic lateral
left lower rib fractures. No acute osseous abnormality identified.
Negative visible bowel gas pattern.
IMPRESSION: No acute cardiopulmonary abnormality.

## 2020-11-25 MED ORDER — ALBUTEROL SULFATE HFA 108 (90 BASE) MCG/ACT IN AERS: 1.0000 | INHALATION_SPRAY | Freq: Four times a day (QID) | RESPIRATORY_TRACT | 0 refills | Status: DC | PRN

## 2020-11-25 MED ORDER — AZELASTINE HCL 0.1 % NA SOLN: 2.0000 | Freq: Two times a day (BID) | NASAL | 0 refills | Status: DC

## 2020-11-25 NOTE — ED Provider Notes (Signed)
CHIEF COMPLAINT:   Chief Complaint  Patient presents with   Cough   Nasal Congestion   Sore Throat   Weakness     SUBJECTIVE/HPI:   Cough Sore Throat  Weakness Associated symptoms: cough   A very pleasant 80 y.o.Male presents today with cough, nasal congestion, fatigue and sore throat which started on Sunday.  Patient reports no known contact with anyone who has been sick or known exposure to COVID-19.  Patient does report that on Thursday he was around a lot of people which were unmasked while he was visiting his great-grandson.  Patient reports that he has used Tylenol which has helped somewhat.  He is requesting a COVID-19 test today.  He also reports some mild chest tightness at times. Patient does not report any shortness of breath, chest pain, palpitations, visual changes, tingling, headache, nausea, vomiting, diarrhea, fever, chills.   has a past medical history of Alcoholism ([place]), Arthritis, Atrial fibrillation ([place]) (1995), Basal cell carcinoma (04/26/2017), Basal cell carcinoma (06/11/2019), Basal cell carcinoma (09/09/2019), Basal cell carcinoma (02/05/2020), Carotid arterial disease ([place]), Colon polyp, Depression, Diverticulitis, Diverticulosis (30 years), GERD (gastroesophageal reflux disease), History of echocardiogram, History of stress test, Hyperlipidemia, Hypertension, Irritable bowel syndrome, Prostate cancer ([place]) (2012), PSVT (paroxysmal supraventricular tachycardia) ([place]), Squamous cell carcinoma of skin (07/18/2017), Squamous cell carcinoma of skin (04/24/2018), Squamous cell carcinoma of skin (06/11/2019), Squamous cell carcinoma of skin (01/09/2020), and Vasovagal syncope.  ROS:  Review of Systems  Respiratory:  Positive for cough.   Neurological:  Positive for weakness.  See Subjective/HPI Medications, Allergies and Problem List personally reviewed in Epic today OBJECTIVE:   Vitals:   11/25/20 0917  BP: 135/83  Pulse: 86  Resp: 20  Temp: 98.2 F (36.8 C)   SpO2: 96%    Physical Exam   General: Appears well-developed and well-nourished. No acute distress.  HEENT Head: Normocephalic and atraumatic.  No frontal, + maxillary sinus and nasal bridge tenderness noted to palpation. Ears: Hearing grossly intact, no drainage or visible deformity.  Nose: No nasal deviation.  Erythematous and congested nasal mucosa noted bilaterally without rhinorrhea Mouth/Throat: No stridor or tracheal deviation.  Non erythematous posterior pharynx noted with clear drainage present.  No Bartolome patchy exudate noted. Eyes: Conjunctivae and EOM are normal. No eye drainage or scleral icterus bilaterally.  Neck: Normal range of motion, neck is supple.  Cardiovascular: Normal rate. Regular rhythm; no murmurs, gallops, or rubs.  Pulm/Chest: No respiratory distress. Decreased breath sounds over bilateral lower lung fields. Upper lung fields, bilaterally, CTA Neurological: Alert and oriented to person, place, and time.  Skin: Skin is warm and dry.  No rashes, lesions, abrasions or bruising noted to skin.   Psychiatric: Normal mood, affect, behavior, and thought content.   Vital signs and nursing note reviewed.   Patient stable and cooperative with examination. PROCEDURES:    LABS/X-RAYS/EKG/MEDS:   No results found for any visits on 11/25/20. CXR: On my read, no pneumonia or pneumothorax.  Overread pending. MEDICAL DECISION MAKING:   Patient presents with cough, nasal congestion, fatigue and sore throat which started on Sunday.  Patient reports no known contact with anyone who has been sick or known exposure to COVID-19.  Patient does report that on Thursday he was around a lot of people which were unmasked while he was visiting his great-grandson.  Patient reports that he has used Tylenol which has helped somewhat.  He is requesting a COVID-19 test today.  He also reports some mild chest tightness at  times. Patient does not report any shortness of breath, chest pain,  palpitations, visual changes, tingling, headache, nausea, vomiting, diarrhea, fever, chills.  Chart review completed.  CXR: On my read, no pneumonia or pneumothorax.  Overread pending.  Given symptoms along with assessment findings, likely viral URI with cough.  COVID-19 PCR pending.  The patient does not appear toxic in clinic today and is in no acute distress.  Rx'd Astelin nasal spray to the patient's preferred pharmacy along with an albuterol inhaler.  Advised about home treatment and care to include Tylenol versus ibuprofen, increased fluids, Mucinex.  Advised of strict return/emergency department precautions for severe worsening of symptoms.  Follow-up with primary care in the next 5 to 7 days if your symptoms or not improving.  Return as needed.  Patient verbalized understanding and agreed with treatment plan.  Patient stable upon discharge. ASSESSMENT/PLAN:  1. Encounter for screening for COVID-19 - Novel Coronavirus, NAA ([facility]); Standing - Novel Coronavirus, NAA ([facility])  2. Viral URI with cough - azelastine (ASTELIN) 0.1 % nasal spray; Place 2 sprays into both nostrils 2 (two) times daily for 7 days. Use in each nostril as directed  Dispense: 30 mL; Refill: 0 - albuterol (VENTOLIN HFA) 108 (90 Base) MCG/ACT inhaler; Inhale 1-2 puffs into the lungs every 6 (six) hours as needed for wheezing or shortness of breath.  Dispense: 6.7 g; Refill: 0  Instructions about new medications and side effects provided.  Plan:   Discharge Instructions      We will call you with any positive results from your COVID-19 testing completed in clinic today.  If you do not receive a phone call from [place] within the next 2-3 days, check your MyChart for up-to-date health information related to testing completed in clinic today.   For most people this is a self-limiting process and can take anywhere from 7 - 10 days to start feeling better. A cough can last up to 3 weeks. Pay special attention to handwashing  as this can help prevent the spread of the virus.   Always read the labels of cough and cold medications as they may contain some of the ingredients below.  Rest, push lots of fluids (especially water), and utilize supportive care for symptoms. You may take acetaminophen (Tylenol) every 4-6 hours and ibuprofen every 6-8 hours for muscle pain, joint pain, headaches (you may also alternate these medications). Mucinex (guaifenesin) may be taken over the counter for cough as needed can loosen phlegm. Please read the instructions and take as directed.  Sudafed (pseudophedrine) is sold behind the counter and can help reduce nasal pressure; avoid taking this if you have high blood pressure or feel jittery. Sudafed PE (phenylephrine) can be a helpful, short-term, over-the-counter alternative to limit side effects or if you have high blood pressure.  Flonase nasal spray can help alleviate congestion and sinus pressure. Many patients choose Afrin as a nasal decongestant; do not use for more than 3 days for risk of rebound (increased symptoms after stopping medication).  Saline nasal sprays or rinses can also help nasal congestion (use bottled or sterile water). Warm tea with lemon and honey can sooth sore throat and cough, as can cough drops.   Return to clinic for high fever not improving with medications, chest pain, difficulty breathing, non-stop vomiting, or coughing blood. Follow-up with your primary care provider if symptoms do not improve as expected in the next 5-7 days.          Serafina Royals, [place] 11/25/20  1015  

## 2020-11-25 NOTE — ED Triage Notes (Signed)
Pt here with URI sx since Sunday. C/O cough, nasal drainage and congestion and weakness. Requests COVID test to rule out. No known contact or exposure

## 2020-11-25 NOTE — Discharge Instructions (Addendum)
We will call you with any positive results from your COVID-19 testing completed in clinic today.  If you do not receive a phone call from [place] within the next 2-3 days, check your MyChart for up-to-date health information related to testing completed in clinic today.   For most people this is a self-limiting process and can take anywhere from 7 - 10 days to start feeling better. A cough can last up to 3 weeks. Pay special attention to handwashing as this can help prevent the spread of the virus.   Always read the labels of cough and cold medications as they may contain some of the ingredients below.  Rest, push lots of fluids (especially water), and utilize supportive care for symptoms. You may take acetaminophen (Tylenol) every 4-6 hours and ibuprofen every 6-8 hours for muscle pain, joint pain, headaches (you may also alternate these medications). Mucinex (guaifenesin) may be taken over the counter for cough as needed can loosen phlegm. Please read the instructions and take as directed.  Sudafed (pseudophedrine) is sold behind the counter and can help reduce nasal pressure; avoid taking this if you have high blood pressure or feel jittery. Sudafed PE (phenylephrine) can be a helpful, short-term, over-the-counter alternative to limit side effects or if you have high blood pressure.  Flonase nasal spray can help alleviate congestion and sinus pressure. Many patients choose Afrin as a nasal decongestant; do not use for more than 3 days for risk of rebound (increased symptoms after stopping medication).  Saline nasal sprays or rinses can also help nasal congestion (use bottled or sterile water). Warm tea with lemon and honey can sooth sore throat and cough, as can cough drops.   Return to clinic for high fever not improving with medications, chest pain, difficulty breathing, non-stop vomiting, or coughing blood. Follow-up with your primary care provider if symptoms do not improve as expected in the next  5-7 days.

## 2020-11-26 ENCOUNTER — Encounter: Payer: Self-pay | Admitting: Family Medicine

## 2020-11-26 LAB — SARS-COV-2, NAA 2 DAY TAT

## 2020-11-26 LAB — NOVEL CORONAVIRUS, NAA: SARS-CoV-2, NAA: DETECTED — AB

## 2020-11-27 ENCOUNTER — Telehealth (HOSPITAL_COMMUNITY): Payer: Self-pay | Admitting: Emergency Medicine

## 2020-11-27 MED ORDER — MOLNUPIRAVIR EUA 200MG CAPSULE: 4.0000 | ORAL_CAPSULE | Freq: Two times a day (BID) | ORAL | 0 refills | Status: AC

## 2020-11-27 NOTE — Telephone Encounter (Signed)
Give him the contact number for [facility] to be screened and treated by the pharmacist

## 2020-11-30 ENCOUNTER — Ambulatory Visit (INDEPENDENT_AMBULATORY_CARE_PROVIDER_SITE_OTHER): Payer: Medicare Other | Admitting: Vascular Surgery

## 2020-12-06 NOTE — Progress Notes (Signed)
MRN : [id number]  Cameron Foster. is a 80 y.o. (03/08/1941) male who presents with chief complaint of my leg swelling is worse.  History of Present Illness:   The patient returns to the office for followup evaluation regarding leg swelling.  The swelling has persisted and the pain associated with swelling continues. There have not been any interval development of a ulcerations or wounds.  Since the previous visit the patient has been wearing graduated compression stockings and has noted little if any improvement in the lymphedema. The patient has been using compression routinely morning until night.  The patient also states elevation during the day and exercise is being done too.  Previous duplex ultrasound of the venous system right lower extremity demonstrates a widely patent deep venous system no evidence of deep or superficial venous reflux with the exception of one small segment of the saphenous vein at the level of the proximal calf.  No outpatient medications have been marked as taking for the 12/07/20 encounter (Appointment) with [name], Dolores Lory, MD.    Past Medical History:  Diagnosis Date   Alcoholism [facility])    Arthritis    Atrial fibrillation ([place]) 1995   one episode   Basal cell carcinoma 04/26/2017   Right medial cheek. Superficial and nodular   Basal cell carcinoma 06/11/2019   Left anterior shoulder. Nodular pattern   Basal cell carcinoma 09/09/2019   Right nasal ala, EDC   Basal cell carcinoma 02/05/2020   L upper eyebrow, EDC    Carotid arterial disease ([place])    a. 07-09-19 Carotid U/S: <50% bilat ICA stenoses.   Colon polyp    Depression    Son died 2014/07/08   Diverticulitis    Diverticulosis 30 years   GERD (gastroesophageal reflux disease)    History of echocardiogram    a. 2019-07-09 Echo: EF 55-60%, no rwma, triv MR/AI.   History of stress test    a. 09/2015 MV: No ischemia/infarct. EF 45-54% (nl by echo).   Hyperlipidemia    Hypertension    Irritable  bowel syndrome    Prostate cancer ([place]) Jul 08, 2010   treated with radiation therapy. Prostate   PSVT (paroxysmal supraventricular tachycardia) ([place])    a. 06/2019 Zio: Avg rate 74 (54-120), occas PACs, rare PVCs, 125 episodes of PVCs (longest 17.5 secs; max rate 187). No afib.   Squamous cell carcinoma of skin 07/18/2017   Left medial calf. KA type   Squamous cell carcinoma of skin 04/24/2018   Right above med. brow   Squamous cell carcinoma of skin 06/11/2019   Right posterior shoulder. SCCis, hypertrophic   Squamous cell carcinoma of skin 01/09/2020   Mid nasal dorsum, MOHS, Efudex x 4wks   Vasovagal syncope     Past Surgical History:  Procedure Laterality Date   [place] no stents   CATARACT EXTRACTION, BILATERAL     COLON RESECTION SIGMOID N/A 12/07/2016   Procedure: COLON RESECTION SIGMOID;  Surgeon: Clayburn Pert, MD;  Location: [facility] ORS;  Service: General;  Laterality: N/A;   COLON SURGERY  11/2016   Colostomy   COLONOSCOPY  07-08-2014   COLONOSCOPY WITH PROPOFOL N/A 05/30/2016   Procedure: COLONOSCOPY WITH PROPOFOL;  Surgeon: Lollie Sails, MD;  Location: [facility] ENDOSCOPY;  Service: Endoscopy;  Laterality: N/A;   COLOSTOMY Left 12/07/2016   Procedure: COLOSTOMY;  Surgeon: Clayburn Pert, MD;  Location: [facility] ORS;  Service: General;  Laterality: Left;   COLOSTOMY REVERSAL N/A 03/21/2017  Procedure: COLOSTOMY REVERSAL;  Surgeon: Clayburn Pert, MD;  Location: [facility] ORS;  Service: General;  Laterality: N/A;   COLOSTOMY TAKEDOWN N/A 03/21/2017   Procedure: LAPAROSCOPIC COLOSTOMY TAKEDOWN;  Surgeon: Clayburn Pert, MD;  Location: [facility] ORS;  Service: General;  Laterality: N/A;   CYSTOSCOPY WITH STENT PLACEMENT Bilateral 03/21/2017   Procedure: CYSTOSCOPY WITH LIGHTED STENT PLACEMENT;  Surgeon: Abbie Sons, MD;  Location: [facility] ORS;  Service: Urology;  Laterality: Bilateral;   ESOPHAGOGASTRODUODENOSCOPY     ESOPHAGOGASTRODUODENOSCOPY N/A 05/30/2016    Procedure: ESOPHAGOGASTRODUODENOSCOPY (EGD);  Surgeon: Lollie Sails, MD;  Location: [facility] ENDOSCOPY;  Service: Endoscopy;  Laterality: N/A;   EYE SURGERY     cataracts   FLEXIBLE SIGMOIDOSCOPY N/A 03/21/2017   Procedure: FLEXIBLE SIGMOIDOSCOPY;  Surgeon: Clayburn Pert, MD;  Location: [facility] ORS;  Service: General;  Laterality: N/A;   FRACTURE SURGERY Bilateral    right arm and left wrist   INCISION AND DRAINAGE ABSCESS N/A 12/07/2016   Procedure: DRAINAGE  OF INTRA ABDOMINAL ABSCESS;  Surgeon: Clayburn Pert, MD;  Location: [facility] ORS;  Service: General;  Laterality: N/A;   KNEE ARTHROSCOPY     LAPAROTOMY N/A 12/07/2016   Procedure: EXPLORATORY LAPAROTOMY;  Surgeon: Clayburn Pert, MD;  Location: [facility] ORS;  Service: General;  Laterality: N/A;   PROSTATE SURGERY     Microwave therapy   TONSILLECTOMY     TOTAL KNEE ARTHROPLASTY Right 07/27/2016   Procedure: TOTAL KNEE ARTHROPLASTY;  Surgeon: [name], MD;  Location: [facility] ORS;  Service: Orthopedics;  Laterality: Right;  Dr. Erlene Quan had to place Urinary catheter due to prostate cancer history.  Using flexible scope.    Social History Social History   Tobacco Use   Smoking status: Former    Packs/day: 1.00    Years: 27.00    Pack years: 27.00    Types: Cigarettes    Quit date: 04/18/1988    Years since quitting: 32.6   Smokeless tobacco: Current    Types: Chew  Vaping Use   Vaping Use: Never used  Substance Use Topics   Alcohol use: Yes    Alcohol/week: 7.0 standard drinks    Types: 7 Glasses of wine per week    Comment: weekly-Formal heavy use ETOH   Drug use: No    Family History Family History  Problem Relation Age of Onset   Lung cancer Father        smoker   Other Mother    Sudden death Son        due to "Blood clots"   Bipolar disorder Son    Heart disease Son    Kidney disease Daughter        congenital one small kidney   Prostate cancer Neg Hx    Bladder Cancer Neg Hx     Allergies  Allergen  Reactions   Formaldehyde Rash     REVIEW OF SYSTEMS (Negative unless checked)  Constitutional: [] Weight loss  [] Fever  [] Chills Cardiac: [] Chest pain   [] Chest pressure   [] Palpitations   [] Shortness of breath when laying flat   [] Shortness of breath with exertion. Vascular:  [] Pain in legs with walking   [] Pain in legs at rest  [] History of DVT   [] Phlebitis   [x] Swelling in legs   [] Varicose veins   [] Non-healing ulcers Pulmonary:   [] Uses home oxygen   [] Productive cough   [] Hemoptysis   [] Wheeze  [] COPD   [] Asthma Neurologic:  [] Dizziness   [] Seizures   [] History of stroke   []   History of TIA  [] Aphasia   [] Vissual changes   [] Weakness or numbness in arm   [] Weakness or numbness in leg Musculoskeletal:   [] Joint swelling   [] Joint pain   [] Low back pain Hematologic:  [] Easy bruising  [] Easy bleeding   [] Hypercoagulable state   [] Anemic Gastrointestinal:  [] Diarrhea   [] Vomiting  [] Gastroesophageal reflux/heartburn   [] Difficulty swallowing. Genitourinary:  [] Chronic kidney disease   [] Difficult urination  [] Frequent urination   [] Blood in urine Skin:  [] Rashes   [] Ulcers  Psychological:  [] History of anxiety   []  History of major depression.  Physical Examination  There were no vitals filed for this visit. There is no height or weight on file to calculate BMI. Gen: WD/WN, NAD Head: [place]/AT, No temporalis wasting.  Ear/Nose/Throat: Hearing grossly intact, nares w/o erythema or drainage, pinna without lesions Eyes: PER, EOMI, sclera nonicteric.  Neck: Supple, no gross masses.  No JVD.  Pulmonary:  Good air movement, no audible wheezing, no use of accessory muscles.  Cardiac: RRR, precordium not hyperdynamic. Vascular:  scattered varicosities present bilaterally.  Moderate venous stasis changes to the legs bilaterally.  3+ soft pitting edema  Vessel Right Left  Radial Palpable Palpable  Gastrointestinal: soft, non-distended. No guarding/no peritoneal signs.  Musculoskeletal: M/S 5/5  throughout.  No deformity.  Neurologic: CN 2-12 intact. Pain and light touch intact in extremities.  Symmetrical.  Speech is fluent. Motor exam as listed above. Psychiatric: Judgment intact, Mood & affect appropriate for pt's clinical situation. Dermatologic: Moderate venous rashes no ulcers noted.  No changes consistent with cellulitis. Lymph : No lichenification or skin changes of chronic lymphedema.  CBC Lab Results  Component Value Date   WBC 7.7 09/24/2020   HGB 12.5 (L) 09/24/2020   HCT 38.3 09/24/2020   MCV 90 09/24/2020   PLT 234 09/24/2020    BMET    Component Value Date/Time   NA 142 09/24/2020 1512   K 3.6 09/24/2020 1512   CL 104 09/24/2020 1512   CO2 21 09/24/2020 1512   GLUCOSE 177 (H) 09/24/2020 1512   GLUCOSE 118 (H) 03/14/2020 1506   BUN 19 09/24/2020 1512   CREATININE 1.41 (H) 09/24/2020 1512   CREATININE 1.40 (H) 08/12/2015 1632   CALCIUM 9.6 09/24/2020 1512   GFRNONAA 51 (L) 03/14/2020 1506   GFRAA 62 08/15/2019 0832   CrCl cannot be calculated (Patient's most recent lab result is older than the maximum 21 days allowed.).  COAG Lab Results  Component Value Date   INR 1.0 06/08/2019   INR 1.0 10/20/2018   INR 1.1 10/10/2018    Radiology DG Chest 2 View  Result Date: 11/25/2020 CLINICAL DATA:  80 year old male with productive cough for 3 days. EXAM: CHEST - 2 VIEW COMPARISON:  CT Chest, Abdomen, and Pelvis 03/14/2020, and earlier. FINDINGS: PA and lateral views. Lung volumes and mediastinal contours are stable and within normal limits. Visualized tracheal air column is within normal limits. Stable lung markings since last year. No pneumothorax, pulmonary edema, pleural effusion or acute pulmonary opacity. Posterior left 5th rib fracture redemonstrated, and chronic lateral left lower rib fractures. No acute osseous abnormality identified. Negative visible bowel gas pattern. IMPRESSION: No acute cardiopulmonary abnormality. Electronically Signed   By: Genevie Ann M.D.   On: 11/25/2020 10:04     Assessment/Plan 1. Lymphedema Recommend:  No surgery or intervention at this point in time.    I have reviewed my previous discussion with the patient regarding swelling and why it causes  symptoms.  Patient will continue wearing graduated compression stockings class 1 (20-30 mmHg) on a daily basis. The patient will  beginning wearing the stockings first thing in the morning and removing them in the evening. The patient is instructed specifically not to sleep in the stockings.    In addition, behavioral modification including several periods of elevation of the lower extremities during the day will be continued.  This was reviewed with the patient during the initial visit.  The patient will also continue routine exercise, especially walking on a daily basis as was discussed during the initial visit.    Despite conservative treatments including graduated compression therapy class 1 and behavioral modification including exercise and elevation the patient  has not obtained adequate control of the lymphedema.  The patient still has stage 3 lymphedema and therefore, I believe that a lymph pump should be added to improve the control of the patient's lymphedema.  Additionally, a lymph pump is warranted because it will reduce the risk of cellulitis and ulceration in the future.  Patient should follow-up in six months    2. Chronic venous insufficiency Recommend:  No surgery or intervention at this point in time.    I have reviewed my previous discussion with the patient regarding swelling and why it causes symptoms.  Patient will continue wearing graduated compression stockings class 1 (20-30 mmHg) on a daily basis. The patient will  beginning wearing the stockings first thing in the morning and removing them in the evening. The patient is instructed specifically not to sleep in the stockings.    In addition, behavioral modification including several periods of  elevation of the lower extremities during the day will be continued.  This was reviewed with the patient during the initial visit.  The patient will also continue routine exercise, especially walking on a daily basis as was discussed during the initial visit.    Despite conservative treatments including graduated compression therapy class 1 and behavioral modification including exercise and elevation the patient  has not obtained adequate control of the lymphedema.  The patient still has stage 3 lymphedema and therefore, I believe that a lymph pump should be added to improve the control of the patient's lymphedema.  Additionally, a lymph pump is warranted because it will reduce the risk of cellulitis and ulceration in the future.  Patient should follow-up in six months    3. Essential hypertension Continue antihypertensive medications as already ordered, these medications have been reviewed and there are no changes at this time.   4. Paroxysmal atrial fibrillation (HCC) Continue antiarrhythmia medications as already ordered, these medications have been reviewed and there are no changes at this time.  Continue anticoagulation as ordered by Cardiology Service     Hortencia Pilar, MD  12/06/2020 2:35 PM

## 2020-12-07 ENCOUNTER — Ambulatory Visit (INDEPENDENT_AMBULATORY_CARE_PROVIDER_SITE_OTHER): Payer: Medicare Other | Admitting: Vascular Surgery

## 2020-12-07 ENCOUNTER — Other Ambulatory Visit: Payer: Self-pay

## 2020-12-07 VITALS — BP 99/64 | HR 77 | Ht 68.0 in | Wt 213.0 lb

## 2020-12-07 DIAGNOSIS — I10 Essential (primary) hypertension: Secondary | ICD-10-CM | POA: Diagnosis not present

## 2020-12-07 DIAGNOSIS — I890 Lymphedema, not elsewhere classified: Secondary | ICD-10-CM

## 2020-12-07 DIAGNOSIS — I480 Paroxysmal atrial fibrillation: Secondary | ICD-10-CM | POA: Diagnosis not present

## 2020-12-07 DIAGNOSIS — I872 Venous insufficiency (chronic) (peripheral): Secondary | ICD-10-CM | POA: Diagnosis not present

## 2020-12-09 ENCOUNTER — Ambulatory Visit (INDEPENDENT_AMBULATORY_CARE_PROVIDER_SITE_OTHER): Payer: Medicare Other | Admitting: Internal Medicine

## 2020-12-09 ENCOUNTER — Encounter: Payer: Self-pay | Admitting: Internal Medicine

## 2020-12-09 ENCOUNTER — Other Ambulatory Visit: Payer: Self-pay

## 2020-12-09 ENCOUNTER — Encounter (INDEPENDENT_AMBULATORY_CARE_PROVIDER_SITE_OTHER): Payer: Self-pay | Admitting: Vascular Surgery

## 2020-12-09 VITALS — BP 128/78 | HR 67 | Ht 68.0 in | Wt 214.2 lb

## 2020-12-09 DIAGNOSIS — I480 Paroxysmal atrial fibrillation: Secondary | ICD-10-CM | POA: Diagnosis not present

## 2020-12-09 DIAGNOSIS — I10 Essential (primary) hypertension: Secondary | ICD-10-CM | POA: Diagnosis not present

## 2020-12-09 NOTE — Progress Notes (Signed)
Follow-up Outpatient Visit Date: 12/09/2020  Primary Care Provider: [name], MD [address] [place] [address]  Chief Complaint: Follow-up atrial fibrillation, hypertension, and leg edema  HPI:  Cameron Foster is a 80 y.o. male with history of hypertension, hyperlipidemia, isolated episode of atrial fibrillation 1993/06/15), subarachnoid hemorrhage of uncertain etiology complicated by more recent stroke, prostate cancer, and alcohol abuse, who presents for follow-up of hypertension and atrial fibrillation.  I last saw him in May for urgent evaluation of elevated blood pressures at home.  Blood pressure in the office was normal.  We did not make any medication changes at that time.  I encouraged Cameron Foster to continue following up with vascular surgery for management of his chronic leg edema.  Today, Cameron Foster reports that he has been doing well.  His chronic leg edema is stable.  He is awaiting lymphedema pumps ordered by vascular surgery.  He denies chest pain, shortness of breath, palpitations, and lightheadedness.  --------------------------------------------------------------------------------------------------  Past Medical History:  Diagnosis Date   Alcoholism ([place])    Arthritis    Atrial fibrillation ([place]) 1995   one episode   Basal cell carcinoma 04/26/2017   Right medial cheek. Superficial and nodular   Basal cell carcinoma 06/11/2019   Left anterior shoulder. Nodular pattern   Basal cell carcinoma 09/09/2019   Right nasal ala, EDC   Basal cell carcinoma 02/05/2020   L upper eyebrow, EDC    Carotid arterial disease ([place])    a. 16-Jun-2019 Carotid U/S: <50% bilat ICA stenoses.   Colon polyp    Depression    Son died 06-15-2014   Diverticulitis    Diverticulosis 30 years   GERD (gastroesophageal reflux disease)    History of echocardiogram    a. 06-16-2019 Echo: EF 55-60%, no rwma, triv MR/AI.   History of stress test    a. 09/2015 MV: No ischemia/infarct. EF  45-54% (nl by echo).   Hyperlipidemia    Hypertension    Irritable bowel syndrome    Prostate cancer ([place]) 06-15-10   treated with radiation therapy. Prostate   PSVT (paroxysmal supraventricular tachycardia) ([place])    a. 06/2019 Zio: Avg rate 74 (54-120), occas PACs, rare PVCs, 125 episodes of PVCs (longest 17.5 secs; max rate 187). No afib.   Squamous cell carcinoma of skin 07/18/2017   Left medial calf. KA type   Squamous cell carcinoma of skin 04/24/2018   Right above med. brow   Squamous cell carcinoma of skin 06/11/2019   Right posterior shoulder. SCCis, hypertrophic   Squamous cell carcinoma of skin 01/09/2020   Mid nasal dorsum, MOHS, Efudex x 4wks   Vasovagal syncope    Past Surgical History:  Procedure Laterality Date   [place] no stents   CATARACT EXTRACTION, BILATERAL     COLON RESECTION SIGMOID N/A 12/07/2016   Procedure: COLON RESECTION SIGMOID;  Surgeon: Clayburn Pert, MD;  Location: [facility] ORS;  Service: General;  Laterality: N/A;   COLON SURGERY  11/2016   Colostomy   COLONOSCOPY  06-15-2014   COLONOSCOPY WITH PROPOFOL N/A 05/30/2016   Procedure: COLONOSCOPY WITH PROPOFOL;  Surgeon: Lollie Sails, MD;  Location: [facility] ENDOSCOPY;  Service: Endoscopy;  Laterality: N/A;   COLOSTOMY Left 12/07/2016   Procedure: COLOSTOMY;  Surgeon: Clayburn Pert, MD;  Location: [facility] ORS;  Service: General;  Laterality: Left;   COLOSTOMY REVERSAL N/A 03/21/2017   Procedure: COLOSTOMY REVERSAL;  Surgeon: Clayburn Pert, MD;  Location:  [facility] ORS;  Service: General;  Laterality: N/A;   COLOSTOMY TAKEDOWN N/A 03/21/2017   Procedure: LAPAROSCOPIC COLOSTOMY TAKEDOWN;  Surgeon: Clayburn Pert, MD;  Location: [facility] ORS;  Service: General;  Laterality: N/A;   CYSTOSCOPY WITH STENT PLACEMENT Bilateral 03/21/2017   Procedure: CYSTOSCOPY WITH LIGHTED STENT PLACEMENT;  Surgeon: Abbie Sons, MD;  Location: [facility] ORS;  Service: Urology;  Laterality: Bilateral;    ESOPHAGOGASTRODUODENOSCOPY     ESOPHAGOGASTRODUODENOSCOPY N/A 05/30/2016   Procedure: ESOPHAGOGASTRODUODENOSCOPY (EGD);  Surgeon: Lollie Sails, MD;  Location: [facility] ENDOSCOPY;  Service: Endoscopy;  Laterality: N/A;   EYE SURGERY     cataracts   FLEXIBLE SIGMOIDOSCOPY N/A 03/21/2017   Procedure: FLEXIBLE SIGMOIDOSCOPY;  Surgeon: Clayburn Pert, MD;  Location: [facility] ORS;  Service: General;  Laterality: N/A;   FRACTURE SURGERY Bilateral    right arm and left wrist   INCISION AND DRAINAGE ABSCESS N/A 12/07/2016   Procedure: DRAINAGE  OF INTRA ABDOMINAL ABSCESS;  Surgeon: Clayburn Pert, MD;  Location: [facility] ORS;  Service: General;  Laterality: N/A;   KNEE ARTHROSCOPY     LAPAROTOMY N/A 12/07/2016   Procedure: EXPLORATORY LAPAROTOMY;  Surgeon: Clayburn Pert, MD;  Location: [facility] ORS;  Service: General;  Laterality: N/A;   PROSTATE SURGERY     Microwave therapy   TONSILLECTOMY     TOTAL KNEE ARTHROPLASTY Right 07/27/2016   Procedure: TOTAL KNEE ARTHROPLASTY;  Surgeon: [name], MD;  Location: [facility] ORS;  Service: Orthopedics;  Laterality: Right;  Dr. Erlene Quan had to place Urinary catheter due to prostate cancer history.  Using flexible scope.     Current Meds  Medication Sig   amLODipine (NORVASC) 10 MG tablet Take 10 mg by mouth daily.   aspirin 81 MG EC tablet Take 81 mg by mouth daily.    atorvastatin (LIPITOR) 40 MG tablet TAKE 1 TABLET BY MOUTH EVERY DAY   Cyanocobalamin (VITAMIN B 12 PO) Take 1,000 mcg by mouth daily.   furosemide (LASIX) 20 MG tablet TAKE 1 TABLET BY MOUTH EVERY DAY   hydrocortisone 2.5 % cream APPLY TO AFFECTED AREA TWICE A DAY AS NEEDED FOR RASH   lisinopril (ZESTRIL) 40 MG tablet TAKE 1 TABLET BY MOUTH EVERY DAY   meclizine (ANTIVERT) 25 MG tablet Take 25 mg by mouth 2 (two) times daily as needed.   meloxicam (MOBIC) 15 MG tablet Take 1 tablet (15 mg total) by mouth daily as needed for pain.   metoprolol succinate (TOPROL-XL) 50 MG 24 hr tablet TAKE 1  TABLET BY MOUTH DAILY. TAKE WITH OR IMMEDIATELY FOLLOWING A MEAL.   Multiple Vitamin (MULTIVITAMIN WITH MINERALS) TABS tablet Take 1 tablet by mouth daily.   omeprazole (PRILOSEC) 20 MG capsule TAKE 1 CAPSULE (20 MG TOTAL) BY MOUTH DAILY AS NEEDED (HEARTBURN).   sertraline (ZOLOFT) 50 MG tablet Take 1 tablet (50 mg total) by mouth daily.   sildenafil (REVATIO) 20 MG tablet Take 1 tablet (20 mg total) by mouth daily.   [DISCONTINUED] lisinopril (ZESTRIL) 10 MG tablet TAKE 1 TABLET BY MOUTH EVERY DAY    Allergies: Formaldehyde  Social History   Tobacco Use   Smoking status: Former    Packs/day: 1.00    Years: 27.00    Pack years: 27.00    Types: Cigarettes    Quit date: 04/18/1988    Years since quitting: 32.6   Smokeless tobacco: Current    Types: Chew  Vaping Use   Vaping Use: Never used  Substance Use Topics   Alcohol use: Yes  Alcohol/week: 7.0 standard drinks    Types: 7 Glasses of wine per week    Comment: weekly-Formal heavy use ETOH   Drug use: No    Family History  Problem Relation Age of Onset   Lung cancer Father        smoker   Other Mother    Sudden death Son        due to "Blood clots"   Bipolar disorder Son    Heart disease Son    Kidney disease Daughter        congenital one small kidney   Prostate cancer Neg Hx    Bladder Cancer Neg Hx     Review of Systems: A 12-system review of systems was performed and was negative except as noted in the HPI.  --------------------------------------------------------------------------------------------------  Physical Exam: BP 128/78 (BP Location: Left Arm, Patient Position: Sitting, Cuff Size: Normal)   Pulse 67   Ht 5' 8"  (1.727 m)   Wt 214 lb 4 oz (97.2 kg)   SpO2 96%   BMI 32.58 kg/m   General:  NAD. Neck: No JVD or HJR. Lungs: Clear to auscultation bilaterally without wheezes or crackles. Heart: Regular rate and rhythm without murmurs, rubs, or gallops. Abdomen: Soft, nontender,  nondistended. Extremities: 1-2+ bilateral calf edema.    EKG:  Normal sinus rhythm with bifascicular block (LAFB and RBBB) and borderline LVH.  No significant change from prior tracing on 09/10/2020.  Lab Results  Component Value Date   WBC 7.7 09/24/2020   HGB 12.5 (L) 09/24/2020   HCT 38.3 09/24/2020   MCV 90 09/24/2020   PLT 234 09/24/2020    Lab Results  Component Value Date   NA 142 09/24/2020   K 3.6 09/24/2020   CL 104 09/24/2020   CO2 21 09/24/2020   BUN 19 09/24/2020   CREATININE 1.41 (H) 09/24/2020   GLUCOSE 177 (H) 09/24/2020   ALT 12 09/24/2020    Lab Results  Component Value Date   CHOL 156 08/15/2019   HDL 46 08/15/2019   LDLCALC 85 08/15/2019   TRIG 144 08/15/2019   CHOLHDL 4.4 06/09/2019    --------------------------------------------------------------------------------------------------  ASSESSMENT AND PLAN: Hypertension: Blood pressure well controlled.  No medication changes today.  Paroxysmal atrial fibrillation: To our knowledge, Cameron Foster has only had a single episode of atrial fibrillation more than 10 years ago.  EKG today again shows sinus rhythm.  He has not had any palpitations.  Given absence of recurrent atrial fibrillation and history of unprovoked SAH, we will defer anticoagulation.  Lymphedema: Relatively stable leg edema.  Continue ongoing management per vascular surgery.  Follow-up: Return to clinic in 1 year.  [name], MD 12/11/2020 7:51 AM

## 2020-12-09 NOTE — Patient Instructions (Signed)
Medication Instructions:   Your physician recommends that you continue on your current medications as directed. Please refer to the Current Medication list given to you today.  *If you need a refill on your cardiac medications before your next appointment, please call your pharmacy*   Lab Work:  None ordered  Testing/Procedures:  None ordered   Follow-Up: At [facility], you and your health needs are our priority.  As part of our continuing mission to provide you with exceptional heart care, we have created designated Provider Care Teams.  These Care Teams include your primary Cardiologist (physician) and Advanced Practice Providers (APPs -  Physician Assistants and Nurse Practitioners) who all work together to provide you with the care you need, when you need it.  We recommend signing up for the patient portal called "MyChart".  Sign up information is provided on this After Visit Summary.  MyChart is used to connect with patients for Virtual Visits (Telemedicine).  Patients are able to view lab/test results, encounter notes, upcoming appointments, etc.  Non-urgent messages can be sent to your provider as well.   To learn more about what you can do with MyChart, go to [email or web address].    Your next appointment:   1 year(s)  The format for your next appointment:   In Person  Provider:   You may see [name], MD or one of the following Advanced Practice Providers on your designated Care Team:   Murray Hodgkins, NP Christell Faith, PA-C Marrianne Mood, PA-C [place]ence [place], [place]

## 2020-12-11 ENCOUNTER — Encounter: Payer: Self-pay | Admitting: Internal Medicine

## 2020-12-17 ENCOUNTER — Other Ambulatory Visit: Payer: Self-pay | Admitting: Family Medicine

## 2021-01-12 ENCOUNTER — Ambulatory Visit: Payer: Self-pay | Admitting: *Deleted

## 2021-01-12 NOTE — Telephone Encounter (Signed)
C/o nasal drainage since having covid at the beginning of August . Patient reports he has constant nose drainage and drainage down the back of his throat. Cough noted with clear sputum at times. Denies fever, chest pain or difficulty breathing. Patient also reports he has noticed black stools on and off for a while. Denies taking any medication to cause black stools and denies blood in stools. Denies dizziness , lightheadedness or weakness. Appt scheduled for 01/15/21. Care advise given. Patient verbalized understanding of care advise and to call back if symptoms worsen.

## 2021-01-12 NOTE — Telephone Encounter (Signed)
Reason for Disposition  [1] Nasal discharge AND [2] present > 10 days  Answer Assessment - Initial Assessment Questions 1. ONSET: "When did the nasal discharge start?"      On going since having covid in beginning of August  2. AMOUNT: "How much discharge is there?"      Constant dripping from nose and down back of throat 3. COUGH: "Do you have a cough?" If yes, ask: "Describe the color of your sputum" (clear, [place], yellow, green)     Yes  clear sputum at time s 4. RESPIRATORY DISTRESS: "Describe your breathing."      Denies  5. FEVER: "Do you have a fever?" If Yes, ask: "What is your temperature, how was it measured, and when did it start?"     no 6. SEVERITY: "Overall, how bad are you feeling right now?" (e.g., doesn't interfere with normal activities, staying home from school/work, staying in bed)      Just cant get rid of symptoms  7. OTHER SYMPTOMS: "Do you have any other symptoms?" (e.g., sore throat, earache, wheezing, vomiting)     Denies  8. PREGNANCY: "Is there any chance you are pregnant?" "When was your last menstrual period?"     na  Protocols used: Common Cold-A-AH

## 2021-01-13 ENCOUNTER — Ambulatory Visit: Payer: Self-pay

## 2021-01-13 ENCOUNTER — Other Ambulatory Visit: Payer: Self-pay

## 2021-01-13 ENCOUNTER — Encounter: Payer: Self-pay | Admitting: Emergency Medicine

## 2021-01-13 ENCOUNTER — Observation Stay
Admission: EM | Admit: 2021-01-13 | Discharge: 2021-01-14 | Disposition: A | Discharge status: Home / Self Care | Payer: Medicare Other | Attending: Emergency Medicine | Admitting: Emergency Medicine

## 2021-01-13 ENCOUNTER — Emergency Department: Payer: Medicare Other

## 2021-01-13 DIAGNOSIS — Z8673 Personal history of transient ischemic attack (TIA), and cerebral infarction without residual deficits: Secondary | ICD-10-CM | POA: Insufficient documentation

## 2021-01-13 DIAGNOSIS — Z87891 Personal history of nicotine dependence: Secondary | ICD-10-CM | POA: Insufficient documentation

## 2021-01-13 DIAGNOSIS — I10 Essential (primary) hypertension: Secondary | ICD-10-CM | POA: Diagnosis not present

## 2021-01-13 DIAGNOSIS — K2950 Unspecified chronic gastritis without bleeding: Secondary | ICD-10-CM | POA: Diagnosis not present

## 2021-01-13 DIAGNOSIS — K921 Melena: Secondary | ICD-10-CM

## 2021-01-13 DIAGNOSIS — N1831 Chronic kidney disease, stage 3a: Secondary | ICD-10-CM | POA: Diagnosis present

## 2021-01-13 DIAGNOSIS — I129 Hypertensive chronic kidney disease with stage 1 through stage 4 chronic kidney disease, or unspecified chronic kidney disease: Secondary | ICD-10-CM | POA: Diagnosis not present

## 2021-01-13 DIAGNOSIS — Z96651 Presence of right artificial knee joint: Secondary | ICD-10-CM | POA: Insufficient documentation

## 2021-01-13 DIAGNOSIS — R051 Acute cough: Secondary | ICD-10-CM | POA: Diagnosis not present

## 2021-01-13 DIAGNOSIS — R195 Other fecal abnormalities: Secondary | ICD-10-CM | POA: Diagnosis not present

## 2021-01-13 DIAGNOSIS — R1084 Generalized abdominal pain: Secondary | ICD-10-CM | POA: Diagnosis not present

## 2021-01-13 DIAGNOSIS — Z03818 Encounter for observation for suspected exposure to other biological agents ruled out: Secondary | ICD-10-CM | POA: Diagnosis not present

## 2021-01-13 DIAGNOSIS — I480 Paroxysmal atrial fibrillation: Secondary | ICD-10-CM | POA: Diagnosis not present

## 2021-01-13 DIAGNOSIS — Z20822 Contact with and (suspected) exposure to covid-19: Secondary | ICD-10-CM | POA: Insufficient documentation

## 2021-01-13 DIAGNOSIS — Z8679 Personal history of other diseases of the circulatory system: Secondary | ICD-10-CM

## 2021-01-13 DIAGNOSIS — K3189 Other diseases of stomach and duodenum: Secondary | ICD-10-CM | POA: Insufficient documentation

## 2021-01-13 DIAGNOSIS — K5730 Diverticulosis of large intestine without perforation or abscess without bleeding: Secondary | ICD-10-CM | POA: Diagnosis not present

## 2021-01-13 DIAGNOSIS — I872 Venous insufficiency (chronic) (peripheral): Secondary | ICD-10-CM | POA: Diagnosis present

## 2021-01-13 DIAGNOSIS — K7689 Other specified diseases of liver: Secondary | ICD-10-CM | POA: Diagnosis not present

## 2021-01-13 DIAGNOSIS — Z79899 Other long term (current) drug therapy: Secondary | ICD-10-CM | POA: Insufficient documentation

## 2021-01-13 DIAGNOSIS — R5383 Other fatigue: Secondary | ICD-10-CM | POA: Diagnosis not present

## 2021-01-13 DIAGNOSIS — K259 Gastric ulcer, unspecified as acute or chronic, without hemorrhage or perforation: Secondary | ICD-10-CM | POA: Insufficient documentation

## 2021-01-13 DIAGNOSIS — N281 Cyst of kidney, acquired: Secondary | ICD-10-CM | POA: Diagnosis not present

## 2021-01-13 DIAGNOSIS — F1020 Alcohol dependence, uncomplicated: Secondary | ICD-10-CM | POA: Diagnosis present

## 2021-01-13 DIAGNOSIS — M160 Bilateral primary osteoarthritis of hip: Secondary | ICD-10-CM | POA: Diagnosis not present

## 2021-01-13 DIAGNOSIS — Z7982 Long term (current) use of aspirin: Secondary | ICD-10-CM | POA: Insufficient documentation

## 2021-01-13 DIAGNOSIS — R2681 Unsteadiness on feet: Secondary | ICD-10-CM | POA: Diagnosis not present

## 2021-01-13 DIAGNOSIS — K2289 Other specified disease of esophagus: Secondary | ICD-10-CM | POA: Insufficient documentation

## 2021-01-13 DIAGNOSIS — Z85828 Personal history of other malignant neoplasm of skin: Secondary | ICD-10-CM | POA: Insufficient documentation

## 2021-01-13 DIAGNOSIS — D62 Acute posthemorrhagic anemia: Secondary | ICD-10-CM | POA: Diagnosis present

## 2021-01-13 DIAGNOSIS — Z9889 Other specified postprocedural states: Secondary | ICD-10-CM | POA: Diagnosis present

## 2021-01-13 DIAGNOSIS — K922 Gastrointestinal hemorrhage, unspecified: Secondary | ICD-10-CM | POA: Diagnosis not present

## 2021-01-13 LAB — COMPREHENSIVE METABOLIC PANEL
ALT: 15 U/L (ref 0–44)
AST: 19 U/L (ref 15–41)
Albumin: 3.6 g/dL (ref 3.5–5.0)
Alkaline Phosphatase: 60 U/L (ref 38–126)
Anion gap: 8 (ref 5–15)
BUN: 53 mg/dL — ABNORMAL HIGH (ref 8–23)
CO2: 25 mmol/L (ref 22–32)
Calcium: 9.3 mg/dL (ref 8.9–10.3)
Chloride: 105 mmol/L (ref 98–111)
Creatinine, Ser: 1.27 mg/dL — ABNORMAL HIGH (ref 0.61–1.24)
GFR, Estimated: 57 mL/min — ABNORMAL LOW (ref >60)
Glucose, Bld: 115 mg/dL — ABNORMAL HIGH (ref 70–99)
Potassium: 4.1 mmol/L (ref 3.5–5.1)
Sodium: 138 mmol/L (ref 135–145)
Total Bilirubin: 0.7 mg/dL (ref 0.3–1.2)
Total Protein: 6.8 g/dL (ref 6.5–8.1)

## 2021-01-13 LAB — CBC
HCT: 29.8 % — ABNORMAL LOW (ref 39.0–52.0)
Hemoglobin: 9.8 g/dL — ABNORMAL LOW (ref 13.0–17.0)
MCH: 30.8 pg (ref 26.0–34.0)
MCHC: 32.9 g/dL (ref 30.0–36.0)
MCV: 93.7 fL (ref 80.0–100.0)
Platelets: 234 10*3/uL (ref 150–400)
RBC: 3.18 MIL/uL — ABNORMAL LOW (ref 4.22–5.81)
RDW: 13.4 % (ref 11.5–15.5)
WBC: 7.4 10*3/uL (ref 4.0–10.5)
nRBC: 0 % (ref 0.0–0.2)

## 2021-01-13 IMAGING — CT CT ABD-PELV W/ CM
2 of 5 series · 16 of 46 positions shown, 18 images · IV contrast (APPLIED)
Comparison: [DATE]

CLINICAL DATA: Epigastric pain, rectal bleeding, dark stools

EXAM:
CT ABDOMEN AND PELVIS WITH CONTRAST
TECHNIQUE: Multidetector CT imaging of the abdomen and pelvis was performed
using the standard protocol following bolus administration of
intravenous contrast.
CONTRAST:  80mL OMNIPAQUE IOHEXOL 350 MG/ML SOLN

[Series 2: axial st · axial · 0.92mm/px · z∈[-1017,-562]mm · 13 of 103 slices shown, 15 images]
[im 6/103  soft-tissue]
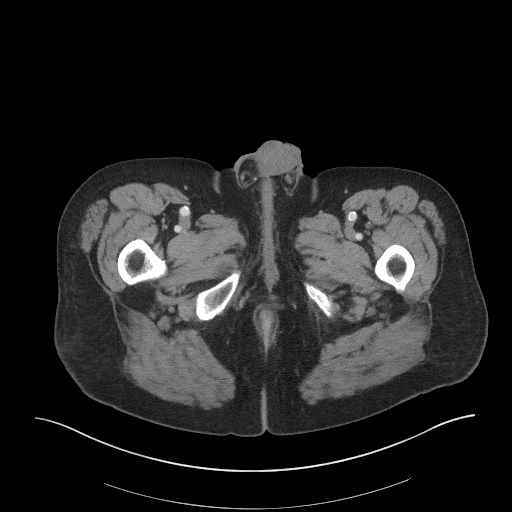
[im 6/103  bone]
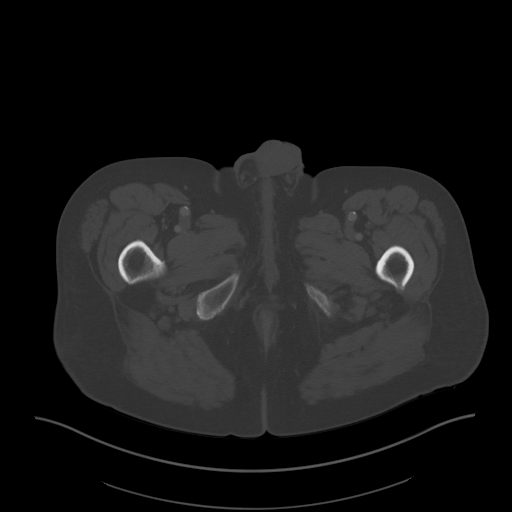
[im 16/103  soft-tissue]
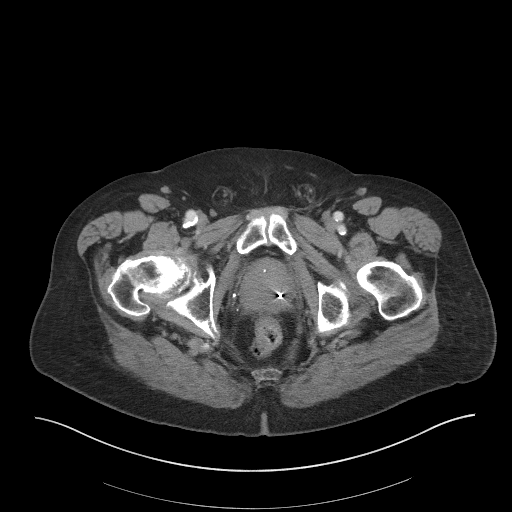
[im 21/103  soft-tissue]
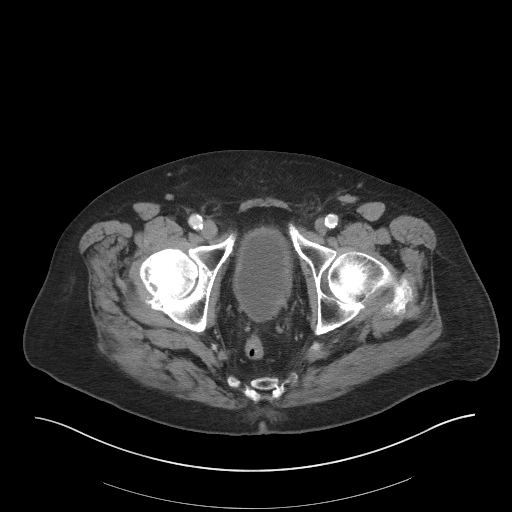
[im 31/103  soft-tissue]
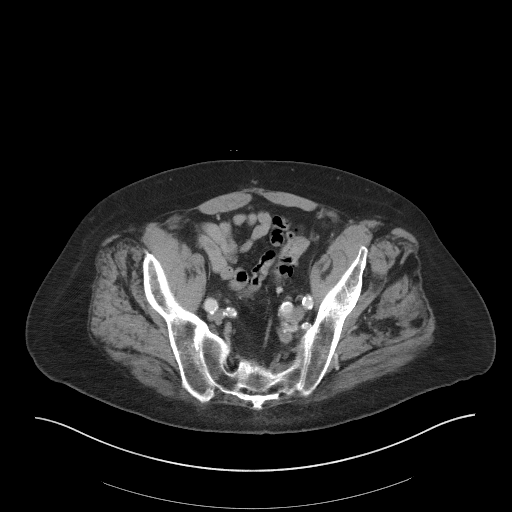
[im 36/103  soft-tissue]
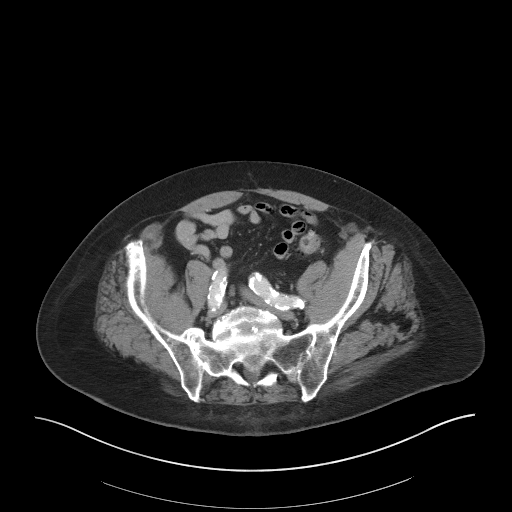
[im 46/103  soft-tissue]
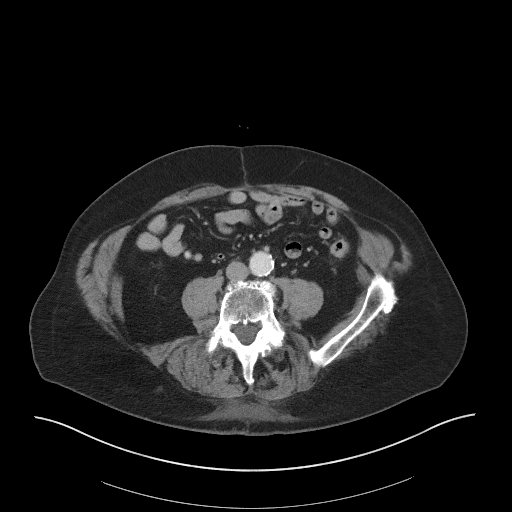
[im 52/103  soft-tissue]
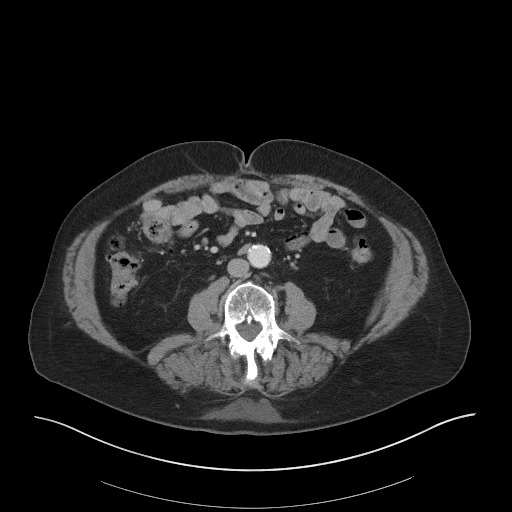
[im 57/103  soft-tissue]
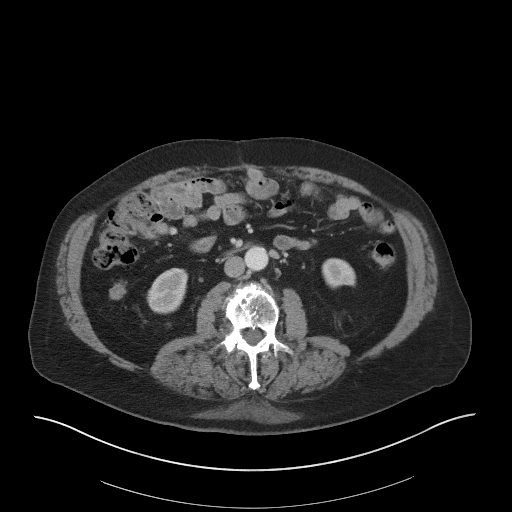
[im 67/103  soft-tissue]
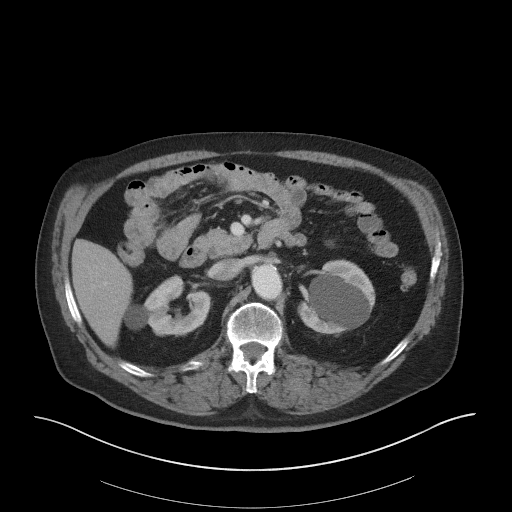
[im 67/103  bone]
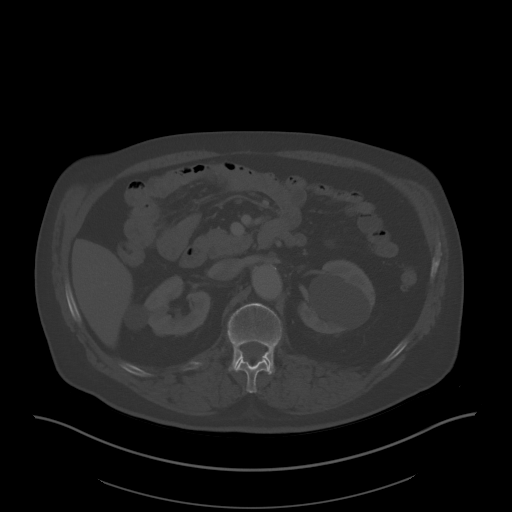
[im 72/103  soft-tissue]
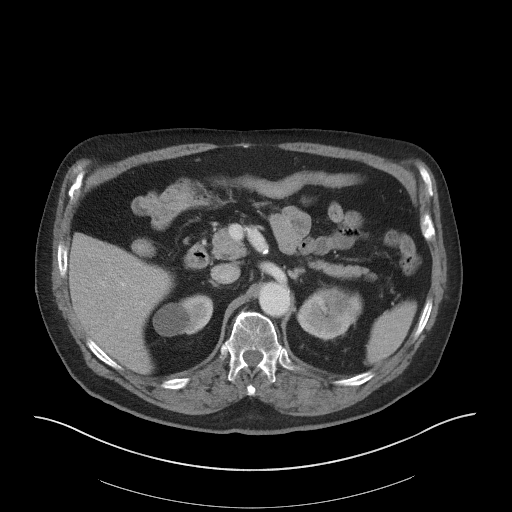
[im 82/103  soft-tissue]
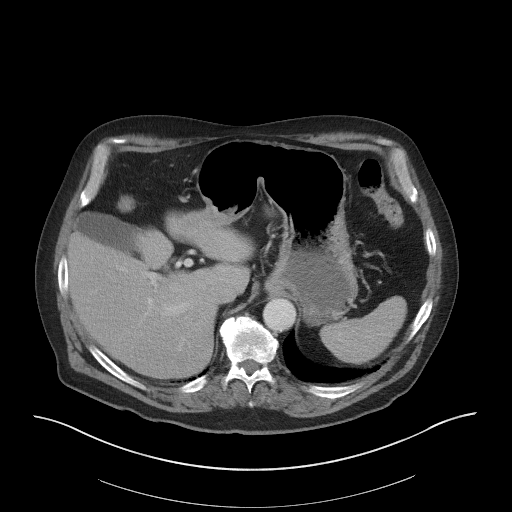
[im 87/103  soft-tissue]
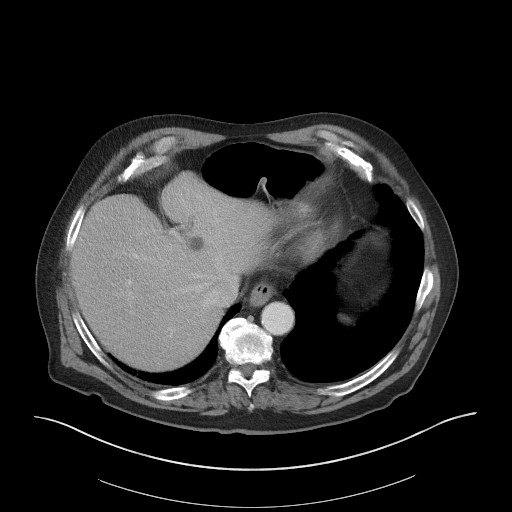
[im 97/103  soft-tissue]
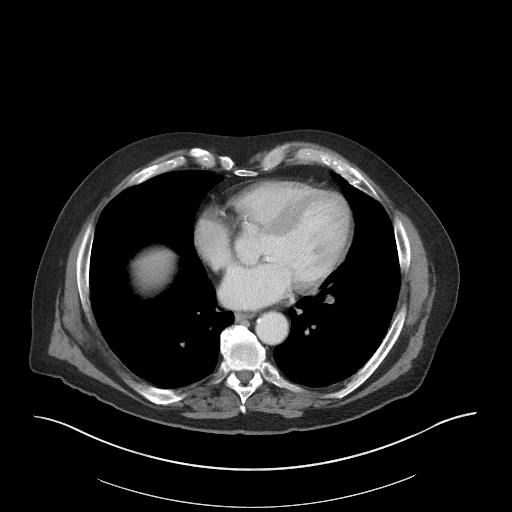

[Series 5: coronal st · coronal · 0.93mm/px · 3 of 117 slices shown]
[im 39/117  soft-tissue]
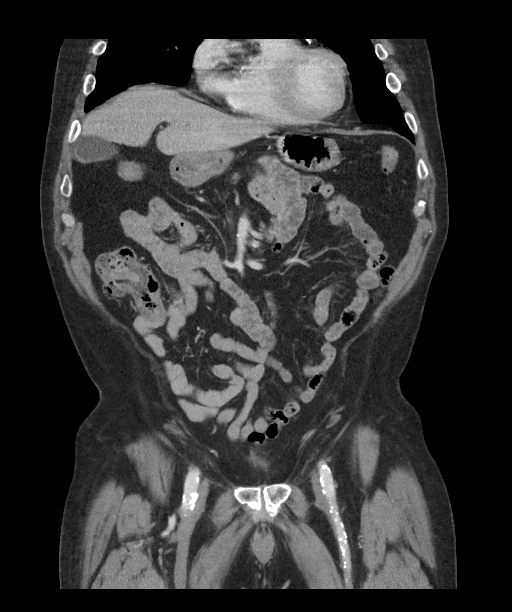
[im 52/117  soft-tissue]
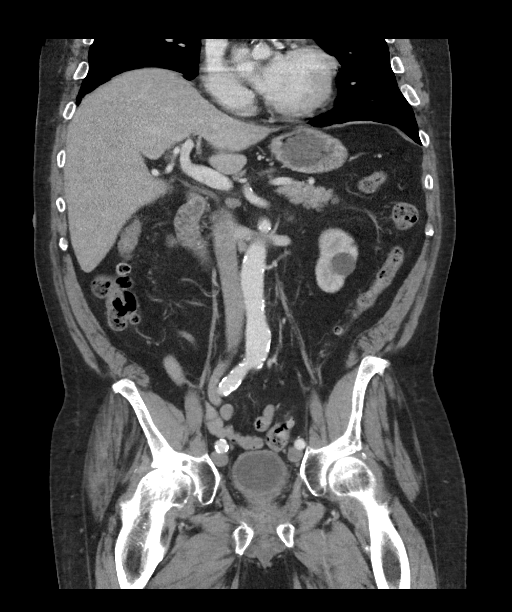
[im 65/117  soft-tissue]
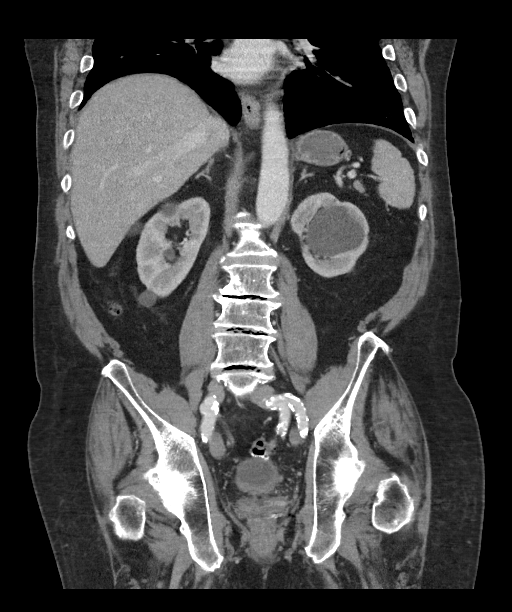

[16 of 46 positions shown; findings below may reference images not displayed]

FINDINGS: Lower chest: No acute pleural or parenchymal lung disease.

Hepatobiliary: Stable small hepatic cysts. No other focal liver
abnormalities. No intrahepatic duct dilation. The gallbladder is
unremarkable.

Pancreas: Unremarkable. No pancreatic ductal dilatation or
surrounding inflammatory changes.

Spleen: Normal in size without focal abnormality.

Adrenals/Urinary Tract: There are stable bilateral renal cysts. No
urinary tract calculi or obstructive uropathy. The adrenals and
bladder are grossly unremarkable.

Stomach/Bowel: No bowel obstruction or ileus. Normal appendix right
lower quadrant. There is diverticulosis throughout the distal colon
without evidence of acute diverticulitis. Postsurgical changes are
seen from prior sigmoid colon resection and reanastomosis. No bowel
wall thickening or inflammatory change.

Vascular/Lymphatic: Aortic atherosclerosis. No enlarged abdominal or
pelvic lymph nodes.

Reproductive: Fiduciary markers are again seen within the prostate.
No gross abnormality.

Other: No free fluid or free gas.  No abdominal wall hernia.

Musculoskeletal: No acute or destructive bony lesions. Bilateral hip
osteoarthritis right greater than left. Reconstructed images
demonstrate no additional findings.
IMPRESSION: 1. Distal colonic diverticulosis without diverticulitis.
2. No acute intra-abdominal or intrapelvic process.
3.  Aortic Atherosclerosis ([MU]-[MU]).

## 2021-01-13 MED ORDER — LORAZEPAM 2 MG/ML IJ SOLN: 0.0000 mg | Freq: Two times a day (BID) | INTRAMUSCULAR | Status: DC

## 2021-01-13 MED ORDER — THIAMINE HCL 100 MG/ML IJ SOLN
100.0000 mg | Freq: Every day | INTRAMUSCULAR | Status: DC
Start: 2021-01-14 — End: 2021-01-13

## 2021-01-13 MED ORDER — THIAMINE HCL 100 MG/ML IJ SOLN: 100.0000 mg | Freq: Every day | INTRAMUSCULAR | Status: DC

## 2021-01-13 MED ORDER — ACETAMINOPHEN 325 MG PO TABS: 650.0000 mg | ORAL_TABLET | Freq: Four times a day (QID) | ORAL | Status: DC | PRN

## 2021-01-13 MED ORDER — SODIUM CHLORIDE 0.9 % IV SOLN: INTRAVENOUS | Status: DC

## 2021-01-13 MED ORDER — THIAMINE HCL 100 MG PO TABS: 100.0000 mg | ORAL_TABLET | Freq: Every day | ORAL | Status: DC

## 2021-01-13 MED ORDER — SODIUM CHLORIDE 0.9 % IV BOLUS
1000.0000 mL | Freq: Once | INTRAVENOUS | Status: AC
  Administered 2021-01-13: 1000 mL via INTRAVENOUS

## 2021-01-13 MED ORDER — MORPHINE SULFATE (PF) 2 MG/ML IV SOLN
2.0000 mg | INTRAVENOUS | Status: DC | PRN
  Administered 2021-01-14: 2 mg via INTRAVENOUS
  Filled 2021-01-13: qty 1

## 2021-01-13 MED ORDER — LORAZEPAM 2 MG/ML IJ SOLN: 1.0000 mg | INTRAMUSCULAR | Status: DC | PRN

## 2021-01-13 MED ORDER — ONDANSETRON HCL 4 MG/2ML IJ SOLN: 4.0000 mg | Freq: Four times a day (QID) | INTRAMUSCULAR | Status: DC | PRN

## 2021-01-13 MED ORDER — ADULT MULTIVITAMIN W/MINERALS CH: 1.0000 | ORAL_TABLET | Freq: Every day | ORAL | Status: DC

## 2021-01-13 MED ORDER — FOLIC ACID 1 MG PO TABS
1.0000 mg | ORAL_TABLET | Freq: Every day | ORAL | Status: DC
  Administered 2021-01-14: 1 mg via ORAL
  Filled 2021-01-13: qty 1

## 2021-01-13 MED ORDER — PANTOPRAZOLE SODIUM 40 MG IV SOLR
40.0000 mg | Freq: Two times a day (BID) | INTRAVENOUS | Status: DC
  Administered 2021-01-14: 40 mg via INTRAVENOUS
  Filled 2021-01-13: qty 40

## 2021-01-13 MED ORDER — ACETAMINOPHEN 325 MG RE SUPP: 650.0000 mg | Freq: Four times a day (QID) | RECTAL | Status: DC | PRN

## 2021-01-13 MED ORDER — IOHEXOL 350 MG/ML SOLN
80.0000 mL | Freq: Once | INTRAVENOUS | Status: AC | PRN
  Administered 2021-01-13: 80 mL via INTRAVENOUS

## 2021-01-13 MED ORDER — LORAZEPAM 2 MG/ML IJ SOLN: 0.0000 mg | Freq: Four times a day (QID) | INTRAMUSCULAR | Status: DC

## 2021-01-13 MED ORDER — THIAMINE HCL 100 MG PO TABS
100.0000 mg | ORAL_TABLET | Freq: Every day | ORAL | Status: DC
  Administered 2021-01-14 (×2): 100 mg via ORAL
  Filled 2021-01-13 (×2): qty 1

## 2021-01-13 MED ORDER — PANTOPRAZOLE SODIUM 40 MG IV SOLR
40.0000 mg | Freq: Once | INTRAVENOUS | Status: AC
  Administered 2021-01-13: 40 mg via INTRAVENOUS
  Filled 2021-01-13: qty 40

## 2021-01-13 MED ORDER — LORAZEPAM 1 MG PO TABS
1.0000 mg | ORAL_TABLET | ORAL | Status: DC | PRN
  Filled 2021-01-13: qty 2

## 2021-01-13 NOTE — Telephone Encounter (Signed)
Pt. States he is feeling worse today. Has an appointment 01/15/21 for black stools. States he had shortness of breath last night while laying in bed. Breathing is better now. No cough. States he had COVID 59 in August and continues to have shortness of breath since then. States "I really don't feel well." No availability in the practice today. Instructed pt. To go to UC. Verbalizes understanding.

## 2021-01-13 NOTE — Telephone Encounter (Signed)
Reason for Disposition  [1] MILD difficulty breathing (e.g., minimal/no SOB at rest, SOB with walking, pulse <100) AND [2] NEW-onset or WORSE than normal  Answer Assessment - Initial Assessment Questions 1. RESPIRATORY STATUS: "Describe your breathing?" (e.g., wheezing, shortness of breath, unable to speak, severe coughing)      Shortness of breath when laying down 2. ONSET: "When did this breathing problem begin?"      Last night 3. PATTERN "Does the difficult breathing come and go, or has it been constant since it started?"      Comes and goes 4. SEVERITY: "How bad is your breathing?" (e.g., mild, moderate, severe)    - MILD: No SOB at rest, mild SOB with walking, speaks normally in sentences, can lie down, no retractions, pulse < 100.    - MODERATE: SOB at rest, SOB with minimal exertion and prefers to sit, cannot lie down flat, speaks in phrases, mild retractions, audible wheezing, pulse 100-120.    - SEVERE: Very SOB at rest, speaks in single words, struggling to breathe, sitting hunched forward, retractions, pulse > 120      Moderate 5. RECURRENT SYMPTOM: "Have you had difficulty breathing before?" If Yes, ask: "When was the last time?" and "What happened that time?"      Yes 6. CARDIAC HISTORY: "Do you have any history of heart disease?" (e.g., heart attack, angina, bypass surgery, angioplasty)      No 7. LUNG HISTORY: "Do you have any history of lung disease?"  (e.g., pulmonary embolus, asthma, emphysema)     COVID 19 in August 8. CAUSE: "What do you think is causing the breathing problem?"      Maybe COVID continued symptoms 9. OTHER SYMPTOMS: "Do you have any other symptoms? (e.g., dizziness, runny nose, cough, chest pain, fever)     Abdominal pain, black stools 10. O2 SATURATION MONITOR:  "Do you use an oxygen saturation monitor (pulse oximeter) at home?" If Yes, "What is your reading (oxygen level) today?" "What is your usual oxygen saturation reading?" (e.g., 95%)       No 11.  PREGNANCY: "Is there any chance you are pregnant?" "When was your last menstrual period?"       N/A 12. TRAVEL: "Have you traveled out of the country in the last month?" (e.g., travel history, exposures)       nO  Protocols used: Breathing Difficulty-A-AH

## 2021-01-13 NOTE — H&P (Signed)
History and Physical    [name] MVH:[id number] DOB: 13-Jul-1940 DOA: 01/13/2021  PCP: [name], MD   Patient coming from: home  I have personally briefly reviewed patient's old medical records in [place]  Chief Complaint: black tarry stool  HPI: Cameron Foster. is a 80 y.o. male with medical history significant for Paroxysmal atrial fibrillation, not on anticoagulation due to history of subarachnoid hemorrhage, history of diverticulitis s/p sigmoid colectomy, lacunar CVA, HTN, CKD stage IIIa, chronic venous insufficiency and alcohol use disorder, who presents to the ED with a complaint of dark tarry stool.  Patient states he first noticed it 3 weeks prior when it was intermittent and then appeared to have resolved but then restarted a week ago and has been daily since.  On awaking on the day of arrival he felt lightheaded and dizzy and presented to the [facility] where they sent him to the ED for evaluation.  Hemoglobin at [facility] was 9.6, down from 12.5 three months prior.  Patient has associated burning, cramping epigastric pain, nonradiating, moderate intensity.  He denies nausea or vomiting.  Denies NSAID use.  Admits to drinking ' a plastic cup' of wine nightly to help him sleep, which she estimates at around 12 ounces.  He denies chest pain, shortness of breath, palpitations.  Denies cough, fever or chills  ED course: On arrival, afebrile, BP 130/84, pulse 88 with O2 sat 99% on room air Hemoglobin 9.6, down from 12.5 on 09/24/2020. Creatinine 1.27 at baseline, BUN 53 LFTs WNL  EKG, personally viewed and interpreted: NSR at 89 with no acute ST-T wave changes  Imaging: CT abdomen and pelvis: Distal colonic diverticulosis without diverticulitis.  No acute intra-abdominal or intrapelvic process  Patient typed and crossed.  Hospitalist consulted for admission.  Review of Systems: As per HPI otherwise all other systems on review of systems negative.     Past Medical History:  Diagnosis Date   Alcoholism [facility])    Arthritis    Atrial fibrillation ([place]) 1995   one episode   Basal cell carcinoma 04/26/2017   Right medial cheek. Superficial and nodular   Basal cell carcinoma 06/11/2019   Left anterior shoulder. Nodular pattern   Basal cell carcinoma 09/09/2019   Right nasal ala, [facility]   Basal cell carcinoma 02/05/2020   L upper eyebrow, [facility]    Carotid arterial disease ([place])    a. 07-05-19 Carotid U/S: <50% bilat ICA stenoses.   Colon polyp    Depression    Son died Jul 04, 2014   Diverticulitis    Diverticulosis 30 years   GERD (gastroesophageal reflux disease)    History of echocardiogram    a. 05-Jul-2019 Echo: EF 55-60%, no rwma, triv MR/AI.   History of stress test    a. 09/2015 MV: No ischemia/infarct. EF 45-54% (nl by echo).   Hyperlipidemia    Hypertension    Irritable bowel syndrome    Prostate cancer ([place]) 04-Jul-2010   treated with radiation therapy. Prostate   PSVT (paroxysmal supraventricular tachycardia) ([place])    a. 06/2019 Zio: Avg rate 74 (54-120), occas PACs, rare PVCs, 125 episodes of PVCs (longest 17.5 secs; max rate 187). No afib.   Squamous cell carcinoma of skin 07/18/2017   Left medial calf. KA type   Squamous cell carcinoma of skin 04/24/2018   Right above med. brow   Squamous cell carcinoma of skin 06/11/2019   Right posterior shoulder. SCCis, hypertrophic   Squamous cell carcinoma of  skin 01/09/2020   Mid nasal dorsum, MOHS, Efudex x 4wks   Vasovagal syncope     Past Surgical History:  Procedure Laterality Date   [place] no stents   CATARACT EXTRACTION, BILATERAL     COLON RESECTION SIGMOID N/A 12/07/2016   Procedure: COLON RESECTION SIGMOID;  Surgeon: Clayburn Pert, MD;  Location: [facility] ORS;  Service: General;  Laterality: N/A;   COLON SURGERY  11/2016   Colostomy   COLONOSCOPY  2016   COLONOSCOPY WITH PROPOFOL N/A 05/30/2016   Procedure: COLONOSCOPY WITH PROPOFOL;   Surgeon: Lollie Sails, MD;  Location: [facility] ENDOSCOPY;  Service: Endoscopy;  Laterality: N/A;   COLOSTOMY Left 12/07/2016   Procedure: COLOSTOMY;  Surgeon: Clayburn Pert, MD;  Location: [facility] ORS;  Service: General;  Laterality: Left;   COLOSTOMY REVERSAL N/A 03/21/2017   Procedure: COLOSTOMY REVERSAL;  Surgeon: Clayburn Pert, MD;  Location: [facility] ORS;  Service: General;  Laterality: N/A;   COLOSTOMY TAKEDOWN N/A 03/21/2017   Procedure: LAPAROSCOPIC COLOSTOMY TAKEDOWN;  Surgeon: Clayburn Pert, MD;  Location: [facility] ORS;  Service: General;  Laterality: N/A;   CYSTOSCOPY WITH STENT PLACEMENT Bilateral 03/21/2017   Procedure: CYSTOSCOPY WITH LIGHTED STENT PLACEMENT;  Surgeon: Abbie Sons, MD;  Location: [facility] ORS;  Service: Urology;  Laterality: Bilateral;   ESOPHAGOGASTRODUODENOSCOPY     ESOPHAGOGASTRODUODENOSCOPY N/A 05/30/2016   Procedure: ESOPHAGOGASTRODUODENOSCOPY (EGD);  Surgeon: Lollie Sails, MD;  Location: [facility] ENDOSCOPY;  Service: Endoscopy;  Laterality: N/A;   EYE SURGERY     cataracts   FLEXIBLE SIGMOIDOSCOPY N/A 03/21/2017   Procedure: FLEXIBLE SIGMOIDOSCOPY;  Surgeon: Clayburn Pert, MD;  Location: [facility] ORS;  Service: General;  Laterality: N/A;   FRACTURE SURGERY Bilateral    right arm and left wrist   INCISION AND DRAINAGE ABSCESS N/A 12/07/2016   Procedure: DRAINAGE  OF INTRA ABDOMINAL ABSCESS;  Surgeon: Clayburn Pert, MD;  Location: [facility] ORS;  Service: General;  Laterality: N/A;   KNEE ARTHROSCOPY     LAPAROTOMY N/A 12/07/2016   Procedure: EXPLORATORY LAPAROTOMY;  Surgeon: Clayburn Pert, MD;  Location: [facility] ORS;  Service: General;  Laterality: N/A;   PROSTATE SURGERY     Microwave therapy   TONSILLECTOMY     TOTAL KNEE ARTHROPLASTY Right 07/27/2016   Procedure: TOTAL KNEE ARTHROPLASTY;  Surgeon: [name], MD;  Location: [facility] ORS;  Service: Orthopedics;  Laterality: Right;  Dr. Erlene Quan had to place Urinary catheter due to prostate cancer history.  Using  flexible scope.     reports that he quit smoking about 32 years ago. His smoking use included cigarettes. He has a 27.00 pack-year smoking history. His smokeless tobacco use includes chew. He reports current alcohol use of about 7.0 standard drinks per week. He reports that he does not use drugs.  Allergies  Allergen Reactions   Formaldehyde Rash    Family History  Problem Relation Age of Onset   Lung cancer Father        smoker   Other Mother    Sudden death Son        due to "Blood clots"   Bipolar disorder Son    Heart disease Son    Kidney disease Daughter        congenital one small kidney   Prostate cancer Neg Hx    Bladder Cancer Neg Hx       Prior to Admission medications   Medication Sig Start Date End Date Taking? Authorizing Provider  amLODipine (NORVASC) 10 MG  tablet Take 10 mg by mouth daily. 11/28/20  Yes [provider]  aspirin 81 MG EC tablet Take 81 mg by mouth daily.    Yes [provider]  atorvastatin (LIPITOR) 40 MG tablet TAKE 1 TABLET BY MOUTH EVERY DAY 09/02/20  Yes Bacigalupo, Dionne Bucy, MD  Cyanocobalamin (VITAMIN B 12 PO) Take 1,000 mcg by mouth daily.   Yes [provider]  furosemide (LASIX) 20 MG tablet TAKE 1 TABLET BY MOUTH EVERY DAY 11/19/20  Yes End, Harrell Gave, MD  hydrocortisone 2.5 % cream APPLY TO AFFECTED AREA TWICE A DAY AS NEEDED FOR RASH 10/22/20  Yes Bacigalupo, Dionne Bucy, MD  lisinopril (ZESTRIL) 40 MG tablet TAKE 1 TABLET BY MOUTH EVERY DAY 09/02/20  Yes Bacigalupo, Dionne Bucy, MD  meclizine (ANTIVERT) 25 MG tablet Take 25 mg by mouth 2 (two) times daily as needed. 05/03/19  Yes [provider]  meloxicam (MOBIC) 15 MG tablet Take 1 tablet (15 mg total) by mouth daily as needed for pain. 06/10/19  Yes Enzo Bi, MD  metoprolol succinate (TOPROL-XL) 50 MG 24 hr tablet TAKE 1 TABLET BY MOUTH DAILY. TAKE WITH OR IMMEDIATELY FOLLOWING A MEAL. 08/18/20  Yes Bacigalupo, Dionne Bucy, MD  Multiple Vitamin (MULTIVITAMIN  WITH MINERALS) TABS tablet Take 1 tablet by mouth daily. 10/24/18  Yes Vaughan Basta, MD  omeprazole (PRILOSEC) 20 MG capsule TAKE 1 CAPSULE (20 MG TOTAL) BY MOUTH DAILY AS NEEDED (HEARTBURN). 09/02/20  Yes Bacigalupo, Dionne Bucy, MD  sertraline (ZOLOFT) 50 MG tablet TAKE 1 TABLET BY MOUTH EVERY DAY Patient taking differently: Take 100 mg by mouth daily. 12/17/20  Yes Bacigalupo, Dionne Bucy, MD  sildenafil (REVATIO) 20 MG tablet Take 1 tablet (20 mg total) by mouth daily. 09/09/20   Abbie Sons, MD    Physical Exam: Vitals:   01/13/21 1516 01/13/21 1522  BP:  130/84  Pulse:  88  Resp:  18  Temp:  98 F (36.7 C)  SpO2:  99%  Weight: 97.2 kg   Height: 5' 8"  (1.727 m)      Vitals:   01/13/21 1516 01/13/21 1522  BP:  130/84  Pulse:  88  Resp:  18  Temp:  98 F (36.7 C)  SpO2:  99%  Weight: 97.2 kg   Height: 5' 8"  (1.727 m)       Constitutional: Alert and oriented x 3 . Not in any apparent distress HEENT:      Head: Normocephalic and atraumatic.         Eyes: PERLA, EOMI, Conjunctivae are normal. Sclera is non-icteric.       Mouth/Throat: Mucous membranes are moist.       Neck: Supple with no signs of meningismus. Cardiovascular: Regular rate and rhythm. No murmurs, gallops, or rubs. 2+ symmetrical distal pulses are present . No JVD.  2+ LE edema Respiratory: Respiratory effort normal .Lungs sounds clear bilaterally. No wheezes, crackles, or rhonchi.  Gastrointestinal: Soft, mild tenderness on palpation in epigastrium, non distended with positive bowel sounds.  Genitourinary: No CVA tenderness. Musculoskeletal: Nontender with normal range of motion in all extremities. No cyanosis, or erythema of extremities. Neurologic:  Face is symmetric. Moving all extremities. No gross focal neurologic deficits . Skin: Skin is warm, dry.  No rash or ulcers Psychiatric: Mood and affect are normal    Labs on Admission: I have personally reviewed following labs and imaging  studies  CBC: Recent Labs  Lab 01/13/21 1522  WBC 7.4  HGB 9.8*  HCT 29.8*  MCV 93.7  PLT 793   Basic Metabolic Panel: Recent Labs  Lab 01/13/21 1522  NA 138  K 4.1  CL 105  CO2 25  GLUCOSE 115*  BUN 53*  CREATININE 1.27*  CALCIUM 9.3   GFR: Estimated Creatinine Clearance: 52.4 mL/min (A) (by C-G formula based on SCr of 1.27 mg/dL (H)). Liver Function Tests: Recent Labs  Lab 01/13/21 1522  AST 19  ALT 15  ALKPHOS 60  BILITOT 0.7  PROT 6.8  ALBUMIN 3.6   No results for input(s): LIPASE, AMYLASE in the last 168 hours. No results for input(s): AMMONIA in the last 168 hours. Coagulation Profile: No results for input(s): INR, PROTIME in the last 168 hours. Cardiac Enzymes: No results for input(s): CKTOTAL, CKMB, CKMBINDEX, TROPONINI in the last 168 hours. BNP (last 3 results) No results for input(s): PROBNP in the last 8760 hours. HbA1C: No results for input(s): HGBA1C in the last 72 hours. CBG: No results for input(s): GLUCAP in the last 168 hours. Lipid Profile: No results for input(s): CHOL, HDL, LDLCALC, TRIG, CHOLHDL, LDLDIRECT in the last 72 hours. Thyroid Function Tests: No results for input(s): TSH, T4TOTAL, FREET4, T3FREE, THYROIDAB in the last 72 hours. Anemia Panel: No results for input(s): VITAMINB12, FOLATE, FERRITIN, TIBC, IRON, RETICCTPCT in the last 72 hours. Urine analysis:    Component Value Date/Time   COLORURINE YELLOW (A) 06/08/2019 1738   APPEARANCEUR CLEAR (A) 06/08/2019 1738   APPEARANCEUR Clear 08/28/2017 0000   LABSPEC 1.015 06/08/2019 1738   PHURINE 5.0 06/08/2019 1738   GLUCOSEU NEGATIVE 06/08/2019 1738   HGBUR NEGATIVE 06/08/2019 1738   BILIRUBINUR NEGATIVE 06/08/2019 1738   BILIRUBINUR Negative 08/28/2017 0000   KETONESUR NEGATIVE 06/08/2019 1738   PROTEINUR NEGATIVE 06/08/2019 1738   UROBILINOGEN 0.2 04/19/2017 1559   NITRITE NEGATIVE 06/08/2019 1738   LEUKOCYTESUR NEGATIVE 06/08/2019 1738    Radiological Exams on  Admission: CT ABDOMEN PELVIS W CONTRAST  Result Date: 01/13/2021 CLINICAL DATA:  Epigastric pain, rectal bleeding, dark stools EXAM: CT ABDOMEN AND PELVIS WITH CONTRAST TECHNIQUE: Multidetector CT imaging of the abdomen and pelvis was performed using the standard protocol following bolus administration of intravenous contrast. CONTRAST:  87m OMNIPAQUE IOHEXOL 350 MG/ML SOLN COMPARISON:  03/14/2020 FINDINGS: Lower chest: No acute pleural or parenchymal lung disease. Hepatobiliary: Stable small hepatic cysts. No other focal liver abnormalities. No intrahepatic duct dilation. The gallbladder is unremarkable. Pancreas: Unremarkable. No pancreatic ductal dilatation or surrounding inflammatory changes. Spleen: Normal in size without focal abnormality. Adrenals/Urinary Tract: There are stable bilateral renal cysts. No urinary tract calculi or obstructive uropathy. The adrenals and bladder are grossly unremarkable. Stomach/Bowel: No bowel obstruction or ileus. Normal appendix right lower quadrant. There is diverticulosis throughout the distal colon without evidence of acute diverticulitis. Postsurgical changes are seen from prior sigmoid colon resection and reanastomosis. No bowel wall thickening or inflammatory change. Vascular/Lymphatic: Aortic atherosclerosis. No enlarged abdominal or pelvic lymph nodes. Reproductive: Fiduciary markers are again seen within the prostate. No gross abnormality. Other: No free fluid or free gas.  No abdominal wall hernia. Musculoskeletal: No acute or destructive bony lesions. Bilateral hip osteoarthritis right greater than left. Reconstructed images demonstrate no additional findings. IMPRESSION: 1. Distal colonic diverticulosis without diverticulitis. 2. No acute intra-abdominal or intrapelvic process. 3.  Aortic Atherosclerosis (ICD10-I70.0). Electronically Signed   By: MRanda NgoM.D.   On: 01/13/2021 18:43     Assessment/Plan 80year old male with history of paroxysmal  atrial fibrillation, not on anticoagulation due to  history of subarachnoid hemorrhage, history of diverticulitis s/p sigmoid colectomy, lacunar CVA, HTN, CKD stage IIIa, chronic venous insufficiency and alcohol use disorder, presenting with 3 weeks of dark tarry stool associated with lightheadedness and dizziness.      Melena   Acute blood loss anemia, symptomatic -3 weeks of intermittent, now consistent dark tarry stool symptomatic for dizziness in the setting of daily alcohol use.  No NSAID use and no blood thinners - Hemoglobin 9.6, down from baseline of 12.5 - Serial H&H - Type and cross and transfuse if greater than 2 g/dL drop in 6 hours or below 7 - IV Protonix twice daily.  IV hydration - N.p.o. after midnight for possible EGD in a.m. - GI consult    Paroxysmal atrial fibrillation (HCC)   History of subarachnoid hemorrhage - Patient with intermittent A. fib, followed by cardiology - Hold aspirin - Continue metoprolol as BP will tolerate - Not currently on anticoagulation due to history of subarachnoid hemorrhage    History of cerebrovascular accident (CVA) - Hold aspirin for now.  Continue atorvastatin    Alcohol use disorder, moderate, dependence ([place]) - Patient with daily alcohol use.  Drinks wine, estimated around 12 ounces daily    Essential hypertension - Hold amlodipine for now    History of sigmoid colectomy secondary to diverticulitis - CT abdomen and pelvis showing diverticulosis without diverticulitis and otherwise stable findings  Chronic venous insufficiency - Hold Lasix    Stage 3a chronic kidney disease ([place]) - Renal function at baseline    DVT prophylaxis:SCD Code Status: full code  Family Communication:  none  Disposition Plan: Back to previous home environment Consults called: GI  Status:At the time of admission, it appears that the appropriate admission status for this patient is INPATIENT. This is judged to be reasonable and necessary in order to  provide the required intensity of service to ensure the patient's safety given the presenting symptoms, physical exam findings, and initial radiographic and laboratory data in the context of their  Comorbid conditions.   Patient requires inpatient status due to high intensity of service, high risk for further deterioration and high frequency of surveillance required.   I certify that at the point of admission it is my clinical judgment that the patient will require inpatient hospital care spanning beyond [place] MD [facility]     01/13/2021, 11:13 PM

## 2021-01-13 NOTE — ED Triage Notes (Signed)
Pt comes into the ED via [facility] clinic c/o rectal bleeding.  Pt denies any blood thinner use.  Pt states his stools are black in color.  Pt does admit to generalized abd pain that has been ongoing x 1-2 weeks.  Pt presents at [facility] with a hgb of 9 and Hct at 26.

## 2021-01-13 NOTE — ED Provider Notes (Signed)
Emergency Medicine Provider Triage Evaluation Note  Cameron Foster , a 80 y.o. male  was evaluated in triage.  Pt complains of epigastric abdominal pain and dark tarry stools.  Patient presents for concern for possible GI bleeding.  He has a history of GERD and is on a PPI for same.  Patient also has a history of severe diverticulitis status colostomy with reversal 3 years ago.  Colostomy lasted for approximately 5 minutes.  He states that his pain is in the epigastric region.  No frank blood but is seeing dark tarry stools.  No urinary symptoms..  Review of Systems  Positive: Epigastric pain with dark tarry stools Negative: No fevers, emesis, diarrhea or constipation.  No bright red blood per rectum  Physical Exam  BP 130/84   Pulse 88   Temp 98 F (36.7 C)   Resp 18   Ht 5' 8"  (1.727 m)   Wt 97.2 kg   SpO2 99%   BMI 32.58 kg/m  Gen:   Awake, no distress   Resp:  Normal effort  MSK:   Moves extremities without difficulty  Other:  Bowel sounds present x4 mild tenderness in the epigastric region without significant other tenderness.  Medical Decision Making  Medically screening exam initiated at 3:23 PM.  Appropriate orders placed.  Geary Rufo. was informed that the remainder of the evaluation will be completed by another provider, this initial triage assessment does not replace that evaluation, and the importance of remaining in the ED until their evaluation is complete.  Patient presents with epigastric abdominal pain, dark tarry stools.  Concern at this time for GI bleeding.  Patient will have labs, type and screen, further evaluation.   Brynda Peon 01/13/21 1530    Harvest Dark, MD 01/13/21 [phone]

## 2021-01-13 NOTE — ED Provider Notes (Signed)
[facility] Emergency Department Provider Note   ____________________________________________   Event Date/Time   First MD Initiated Contact with Patient 01/13/21 2109-06-29     (approximate)  I have reviewed the triage vital signs and the nursing notes.   HISTORY  Chief Complaint Rectal Bleeding    HPI Cameron Foster. is a 80 y.o. male who presents for midepigastric abdominal pain with associated dark tarry stools  LOCATION: Mid epigastric abdomen DURATION: 1 week prior to arrival TIMING: Stable since onset SEVERITY: Moderate QUALITY: Burning midepigastric abdominal pain CONTEXT: Patient states that 2 weeks prior to arrival he began having some burning midepigastric abdominal pain that was associated with few episodes of dark tarry stools.  Patient states that this resolved but then recurred approximately 1 week prior to arrival.  Patient was seen at [facility] clinic today and found to have a positive stool guaiac MODIFYING FACTORS: Denies any exacerbating or relieving factors. ASSOCIATED SYMPTOMS: Dark tarry stools   Per medical record review, patient has history of diverticulosis status post ostomy and ostomy takedown as well as states that he drinks a large glass of wine a night.          Past Medical History:  Diagnosis Date   Alcoholism [facility])    Arthritis    Atrial fibrillation ([place]) 1995   one episode   Basal cell carcinoma 04/26/2017   Right medial cheek. Superficial and nodular   Basal cell carcinoma 06/11/2019   Left anterior shoulder. Nodular pattern   Basal cell carcinoma 09/09/2019   Right nasal ala, EDC   Basal cell carcinoma 02/05/2020   L upper eyebrow, ED[facility]    Carotid arterial disease ([place])    a. Jun 30, 2019 Carotid U/S: <50% bilat ICA stenoses.   Colon polyp    Depression    Son died 06-29-14   Diverticulitis    Diverticulosis 30 years   GERD (gastroesophageal reflux disease)    History of echocardiogram    a. 2019-06-30 Echo: EF  55-60%, no rwma, triv MR/AI.   History of stress test    a. 09/2015 MV: No ischemia/infarct. EF 45-54% (nl by echo).   Hyperlipidemia    Hypertension    Irritable bowel syndrome    Prostate cancer ([place]) 06-29-10   treated with radiation therapy. Prostate   PSVT (paroxysmal supraventricular tachycardia) ([place])    a. 06/2019 Zio: Avg rate 74 (54-120), occas PACs, rare PVCs, 125 episodes of PVCs (longest 17.5 secs; max rate 187). No afib.   Squamous cell carcinoma of skin 07/18/2017   Left medial calf. KA type   Squamous cell carcinoma of skin 04/24/2018   Right above med. brow   Squamous cell carcinoma of skin 06/11/2019   Right posterior shoulder. SCCis, hypertrophic   Squamous cell carcinoma of skin 01/09/2020   Mid nasal dorsum, MOHS, Efudex x 4wks   Vasovagal syncope     Patient Active Problem List   Diagnosis Date Noted   Stage 3a chronic kidney disease ([place]) 11/23/2020   Anemia 11/23/2020   Chronic pain of right ankle 05/21/2020   Encounter for attention to colostomy ([place]) 05/21/2020   Achilles tendinitis 04/30/2020   Synovitis and tenosynovitis 04/30/2020   Lymphedema 03/03/2020   Chronic venous insufficiency 03/03/2020   Fatigue 02/14/2020   Leg edema 02/14/2020   Foraminal stenosis of lumbar region 11/08/2019   Chronic bilateral low back pain without sciatica 10/18/2019   Foraminal stenosis of cervical region 10/18/2019   PSVT (paroxysmal supraventricular tachycardia) ([place])  08/01/2019   Lacunar infarction ([place]) 07/10/2019   History of lacunar cerebrovascular accident (CVA) 07/01/2019   Cerebrovascular accident (CVA) ([place]) 06/08/2019   Paroxysmal atrial fibrillation ([place]) 11/07/2018   TIA (transient ischemic attack) 10/20/2018   History of subarachnoid hemorrhage 10/16/2018   Recurrent sinusitis 10/16/2018   Low libido 08/30/2018   Obesity 05/14/2018   Hyperlipidemia LDL goal <70 05/14/2018   Degeneration of lumbar intervertebral disc 03/22/2018   Osteoarthritis of hip  03/22/2018   Trochanteric bursitis of right hip 03/22/2018   Status post partial colectomy 01/03/2017   Depression, major, single episode, mild ([place]) 01/03/2017   Vasovagal syncope 01/27/2016   History of skin cancer 10/28/2015   Osteoarthritis of right knee 10/01/2015   Essential hypertension 08/25/2015   Dyspnea on exertion 08/12/2015   Erectile dysfunction following radiation therapy 08/04/2015   Constipation 08/02/2015   History of prostate cancer 01/20/2015   Swelling of right lower extremity 01/20/2015    Past Surgical History:  Procedure Laterality Date   [place] no stents   CATARACT EXTRACTION, BILATERAL     COLON RESECTION SIGMOID N/A 12/07/2016   Procedure: COLON RESECTION SIGMOID;  Surgeon: Clayburn Pert, MD;  Location: [facility] ORS;  Service: General;  Laterality: N/A;   COLON SURGERY  11/2016   Colostomy   COLONOSCOPY  2016   COLONOSCOPY WITH PROPOFOL N/A 05/30/2016   Procedure: COLONOSCOPY WITH PROPOFOL;  Surgeon: Lollie Sails, MD;  Location[facility] ENDOSCOPY;  Service: Endoscopy;  Laterality: N/A;   COLOSTOMY Left 12/07/2016   Procedure: COLOSTOMY;  Surgeon: Clayburn Pert, MD;  Location: [facility] ORS;  Service: General;  Laterality: Left;   COLOSTOMY REVERSAL N/A 03/21/2017   Procedure: COLOSTOMY REVERSAL;  Surgeon: Clayburn Pert, MD;  Location: [facility] ORS;  Service: General;  Laterality: N/A;   COLOSTOMY TAKEDOWN N/A 03/21/2017   Procedure: LAPAROSCOPIC COLOSTOMY TAKEDOWN;  Surgeon: Clayburn Pert, MD;  Location: [facility] ORS;  Service: General;  Laterality: N/A;   CYSTOSCOPY WITH STENT PLACEMENT Bilateral 03/21/2017   Procedure: CYSTOSCOPY WITH LIGHTED STENT PLACEMENT;  Surgeon: Abbie Sons, MD;  Location: [facility] ORS;  Service: Urology;  Laterality: Bilateral;   ESOPHAGOGASTRODUODENOSCOPY     ESOPHAGOGASTRODUODENOSCOPY N/A 05/30/2016   Procedure: ESOPHAGOGASTRODUODENOSCOPY (EGD);  Surgeon: Lollie Sails, MD;  Location: [facility]  ENDOSCOPY;  Service: Endoscopy;  Laterality: N/A;   EYE SURGERY     cataracts   FLEXIBLE SIGMOIDOSCOPY N/A 03/21/2017   Procedure: FLEXIBLE SIGMOIDOSCOPY;  Surgeon: Clayburn Pert, MD;  Location: [facility] ORS;  Service: General;  Laterality: N/A;   FRACTURE SURGERY Bilateral    right arm and left wrist   INCISION AND DRAINAGE ABSCESS N/A 12/07/2016   Procedure: DRAINAGE  OF INTRA ABDOMINAL ABSCESS;  Surgeon: Clayburn Pert, MD;  Location: [facility] ORS;  Service: General;  Laterality: N/A;   KNEE ARTHROSCOPY     LAPAROTOMY N/A 12/07/2016   Procedure: EXPLORATORY LAPAROTOMY;  Surgeon: Clayburn Pert, MD;  Location: [facility] ORS;  Service: General;  Laterality: N/A;   PROSTATE SURGERY     Microwave therapy   TONSILLECTOMY     TOTAL KNEE ARTHROPLASTY Right 07/27/2016   Procedure: TOTAL KNEE ARTHROPLASTY;  Surgeon: [name], MD;  Location: [facility] ORS;  Service: Orthopedics;  Laterality: Right;  Dr. Erlene Quan had to place Urinary catheter due to prostate cancer history.  Using flexible scope.    Prior to Admission medications   Medication Sig Start Date End Date Taking? Authorizing Provider  amLODipine (NORVASC) 10 MG tablet Take 10 mg by  mouth daily. 11/28/20   [provider]  aspirin 81 MG EC tablet Take 81 mg by mouth daily.     [provider]  atorvastatin (LIPITOR) 40 MG tablet TAKE 1 TABLET BY MOUTH EVERY DAY 09/02/20   Bacigalupo, Dionne Bucy, MD  Cyanocobalamin (VITAMIN B 12 PO) Take 1,000 mcg by mouth daily.    [provider]  furosemide (LASIX) 20 MG tablet TAKE 1 TABLET BY MOUTH EVERY DAY 11/19/20   End, Harrell Gave, MD  hydrocortisone 2.5 % cream APPLY TO AFFECTED AREA TWICE A DAY AS NEEDED FOR RASH 10/22/20   Bacigalupo, Dionne Bucy, MD  lisinopril (ZESTRIL) 40 MG tablet TAKE 1 TABLET BY MOUTH EVERY DAY 09/02/20   Bacigalupo, Dionne Bucy, MD  meclizine (ANTIVERT) 25 MG tablet Take 25 mg by mouth 2 (two) times daily as needed. 05/03/19   [provider]  meloxicam  (MOBIC) 15 MG tablet Take 1 tablet (15 mg total) by mouth daily as needed for pain. 06/10/19   Enzo Bi, MD  metoprolol succinate (TOPROL-XL) 50 MG 24 hr tablet TAKE 1 TABLET BY MOUTH DAILY. TAKE WITH OR IMMEDIATELY FOLLOWING A MEAL. 08/18/20   Bacigalupo, Dionne Bucy, MD  Multiple Vitamin (MULTIVITAMIN WITH MINERALS) TABS tablet Take 1 tablet by mouth daily. 10/24/18   Vaughan Basta, MD  omeprazole (PRILOSEC) 20 MG capsule TAKE 1 CAPSULE (20 MG TOTAL) BY MOUTH DAILY AS NEEDED (HEARTBURN). 09/02/20   [name], MD  sertraline (ZOLOFT) 50 MG tablet TAKE 1 TABLET BY MOUTH EVERY DAY 12/17/20   Bacigalupo, Dionne Bucy, MD  sildenafil (REVATIO) 20 MG tablet Take 1 tablet (20 mg total) by mouth daily. 09/09/20   Abbie Sons, MD    Allergies Formaldehyde  Family History  Problem Relation Age of Onset   Lung cancer Father        smoker   Other Mother    Sudden death Son        due to "Blood clots"   Bipolar disorder Son    Heart disease Son    Kidney disease Daughter        congenital one small kidney   Prostate cancer Neg Hx    Bladder Cancer Neg Hx     Social History Social History   Tobacco Use   Smoking status: Former    Packs/day: 1.00    Years: 27.00    Pack years: 27.00    Types: Cigarettes    Quit date: 04/18/1988    Years since quitting: 32.7   Smokeless tobacco: Current    Types: Chew  Vaping Use   Vaping Use: Never used  Substance Use Topics   Alcohol use: Yes    Alcohol/week: 7.0 standard drinks    Types: 7 Glasses of wine per week    Comment: weekly-Formal heavy use ETOH   Drug use: No    Review of Systems Constitutional: No fever/chills Eyes: No visual changes. ENT: No sore throat. Cardiovascular: Denies chest pain. Respiratory: Denies shortness of breath. Gastrointestinal: Endorses midepigastric abdominal pain.  No nausea, no vomiting.  No diarrhea.  Endorses melena Genitourinary: Negative for dysuria. Musculoskeletal: Negative for acute  arthralgias Skin: Negative for rash. Neurological: Negative for headaches, weakness/numbness/paresthesias in any extremity Psychiatric: Negative for suicidal ideation/homicidal ideation   ____________________________________________   PHYSICAL EXAM:  VITAL SIGNS: ED Triage Vitals  Enc Vitals Group     BP 01/13/21 1522 130/84     Pulse Rate 01/13/21 1522 88     Resp 01/13/21  1522 18     Temp 01/13/21 1522 98 F (36.7 C)     Temp src --      SpO2 01/13/21 1522 99 %     Weight 01/13/21 1516 214 lb 4.6 oz (97.2 kg)     Height 01/13/21 1516 5' 8"  (1.727 m)     Head Circumference --      Peak Flow --      Pain Score 01/13/21 1516 2     Pain Loc --      Pain Edu? --      Excl. in [place]? --    Constitutional: Alert and oriented. Well appearing and in no acute distress. Eyes: Conjunctivae are normal. PERRL. Head: Atraumatic. Nose: No congestion/rhinnorhea. Mouth/Throat: Mucous membranes are moist. Neck: No stridor Cardiovascular: Grossly normal heart sounds.  Good peripheral circulation. Respiratory: Normal respiratory effort.  No retractions. Gastrointestinal: Soft and mild midepigastric tenderness to palpation. No distention. Musculoskeletal: No obvious deformities Neurologic:  Normal speech and language. No gross focal neurologic deficits are appreciated. Skin:  Skin is warm and dry. No rash noted. Psychiatric: Mood and affect are normal. Speech and behavior are normal.  ____________________________________________   LABS (all labs ordered are listed, but only abnormal results are displayed)  Labs Reviewed  COMPREHENSIVE METABOLIC PANEL - Abnormal; Notable for the following components:      Result Value   Glucose, Bld 115 (*)    BUN 53 (*)    Creatinine, Ser 1.27 (*)    GFR, Estimated 57 (*)    All other components within normal limits  CBC - Abnormal; Notable for the following components:   RBC 3.18 (*)    Hemoglobin 9.8 (*)    HCT 29.8 (*)    All other components  within normal limits  POC OCCULT BLOOD, ED  TYPE AND SCREEN   RADIOLOGY  ED MD interpretation: CT of the abdomen and pelvis with IV contrast shows no evidence of acute intra-abdominal or intrapelvic process  Official radiology report(s): CT ABDOMEN PELVIS W CONTRAST  Result Date: 01/13/2021 CLINICAL DATA:  Epigastric pain, rectal bleeding, dark stools EXAM: CT ABDOMEN AND PELVIS WITH CONTRAST TECHNIQUE: Multidetector CT imaging of the abdomen and pelvis was performed using the standard protocol following bolus administration of intravenous contrast. CONTRAST:  28m OMNIPAQUE IOHEXOL 350 MG/ML SOLN COMPARISON:  03/14/2020 FINDINGS: Lower chest: No acute pleural or parenchymal lung disease. Hepatobiliary: Stable small hepatic cysts. No other focal liver abnormalities. No intrahepatic duct dilation. The gallbladder is unremarkable. Pancreas: Unremarkable. No pancreatic ductal dilatation or surrounding inflammatory changes. Spleen: Normal in size without focal abnormality. Adrenals/Urinary Tract: There are stable bilateral renal cysts. No urinary tract calculi or obstructive uropathy. The adrenals and bladder are grossly unremarkable. Stomach/Bowel: No bowel obstruction or ileus. Normal appendix right lower quadrant. There is diverticulosis throughout the distal colon without evidence of acute diverticulitis. Postsurgical changes are seen from prior sigmoid colon resection and reanastomosis. No bowel wall thickening or inflammatory change. Vascular/Lymphatic: Aortic atherosclerosis. No enlarged abdominal or pelvic lymph nodes. Reproductive: Fiduciary markers are again seen within the prostate. No gross abnormality. Other: No free fluid or free gas.  No abdominal wall hernia. Musculoskeletal: No acute or destructive bony lesions. Bilateral hip osteoarthritis right greater than left. Reconstructed images demonstrate no additional findings. IMPRESSION: 1. Distal colonic diverticulosis without diverticulitis. 2.  No acute intra-abdominal or intrapelvic process. 3.  Aortic Atherosclerosis (ICD10-I70.0). Electronically Signed   By: MRanda NgoM.D.   On: 01/13/2021 18:43  ____________________________________________   PROCEDURES  Procedure(s) performed (including Critical Care):  .1-3 Lead EKG Interpretation Performed by: Naaman Plummer, MD Authorized by: Naaman Plummer, MD     Interpretation: normal     ECG rate:  89   ECG rate assessment: normal     Rhythm: sinus rhythm     Ectopy: none     Conduction: normal    CRITICAL CARE Performed by: Naaman Plummer   Total critical care time: 41 minutes  Critical care time was exclusive of separately billable procedures and treating other patients.  Critical care was necessary to treat or prevent imminent or life-threatening deterioration.  Critical care was time spent personally by me on the following activities: development of treatment plan with patient and/or surrogate as well as nursing, discussions with consultants, evaluation of patient's response to treatment, examination of patient, obtaining history from patient or surrogate, ordering and performing treatments and interventions, ordering and review of laboratory studies, ordering and review of radiographic studies, pulse oximetry and re-evaluation of patient's condition.  ____________________________________________   INITIAL IMPRESSION / ASSESSMENT AND PLAN / ED COURSE  As part of my medical decision making, I reviewed the following data within the electronic medical record, if available:  Nursing notes reviewed and incorporated, Labs reviewed, EKG interpreted, Old chart reviewed, Radiograph reviewed and Notes from prior ED visits reviewed and incorporated      + abdominal pain  + black stool per rectum Given history and exam patients presentation most consistent with upper GI bleed possibly secondary to peptic ulcer disease or variceal bleeding. I have low suspicion for  aortoenteric fistula, ENT bleeding mimic, [place], Pulmonary bleeding mimic.  Workup: CBC, BMP, LFTs, Lipase, PT/INR, Type and Screen  Interventions: Analgesia and antiemetic medications PRN Protonix 43m IVP Ceftriaxone 1 gram IV PRBC transfusion  Findings: Hb: 9.8  Disposition: Admit for close monitoring.     ____________________________________________   FINAL CLINICAL IMPRESSION(S) / ED DIAGNOSES  Final diagnoses:  None     ED Discharge Orders     None        Note:  This document was prepared using Dragon voice recognition software and may include unintentional dictation errors.    BNaaman Plummer MD 01/13/21 [phone]

## 2021-01-14 ENCOUNTER — Encounter
Admission: EM | Disposition: A | Discharge status: Home / Self Care | Payer: Medicare Other | Source: Home / Self Care | Attending: Emergency Medicine

## 2021-01-14 ENCOUNTER — Inpatient Hospital Stay: Payer: Medicare Other | Admitting: Anesthesiology

## 2021-01-14 ENCOUNTER — Encounter: Payer: Self-pay | Admitting: Internal Medicine

## 2021-01-14 DIAGNOSIS — I480 Paroxysmal atrial fibrillation: Secondary | ICD-10-CM | POA: Diagnosis not present

## 2021-01-14 DIAGNOSIS — K2950 Unspecified chronic gastritis without bleeding: Secondary | ICD-10-CM | POA: Diagnosis not present

## 2021-01-14 DIAGNOSIS — K2970 Gastritis, unspecified, without bleeding: Secondary | ICD-10-CM | POA: Diagnosis not present

## 2021-01-14 DIAGNOSIS — K2990 Gastroduodenitis, unspecified, without bleeding: Secondary | ICD-10-CM | POA: Diagnosis not present

## 2021-01-14 DIAGNOSIS — K921 Melena: Secondary | ICD-10-CM | POA: Diagnosis not present

## 2021-01-14 DIAGNOSIS — K259 Gastric ulcer, unspecified as acute or chronic, without hemorrhage or perforation: Secondary | ICD-10-CM | POA: Diagnosis not present

## 2021-01-14 DIAGNOSIS — K922 Gastrointestinal hemorrhage, unspecified: Secondary | ICD-10-CM | POA: Diagnosis not present

## 2021-01-14 DIAGNOSIS — R1013 Epigastric pain: Secondary | ICD-10-CM | POA: Diagnosis not present

## 2021-01-14 HISTORY — PX: ESOPHAGOGASTRODUODENOSCOPY: SHX5428

## 2021-01-14 LAB — HEMOGLOBIN
Hemoglobin: 7.9 g/dL — ABNORMAL LOW (ref 13.0–17.0)
Hemoglobin: 8.5 g/dL — ABNORMAL LOW (ref 13.0–17.0)

## 2021-01-14 LAB — SARS CORONAVIRUS 2 (TAT 6-24 HRS): SARS Coronavirus 2: NEGATIVE

## 2021-01-14 LAB — PREPARE RBC (CROSSMATCH)

## 2021-01-14 SURGERY — EGD (ESOPHAGOGASTRODUODENOSCOPY)
Anesthesia: General

## 2021-01-14 MED ORDER — PANTOPRAZOLE SODIUM 40 MG PO TBEC: 40.0000 mg | DELAYED_RELEASE_TABLET | Freq: Two times a day (BID) | ORAL | 2 refills | Status: DC

## 2021-01-14 MED ORDER — PROPOFOL 10 MG/ML IV BOLUS
INTRAVENOUS | Status: DC | PRN
  Administered 2021-01-14: 30 mg via INTRAVENOUS
  Administered 2021-01-14: 20 mg via INTRAVENOUS

## 2021-01-14 MED ORDER — SODIUM CHLORIDE 0.9 % IV SOLN
INTRAVENOUS | Status: DC
  Administered 2021-01-14: 1000 mL via INTRAVENOUS

## 2021-01-14 MED ORDER — SODIUM CHLORIDE 0.9 % IV SOLN: INTRAVENOUS | Status: DC

## 2021-01-14 MED ORDER — LORAZEPAM 2 MG/ML IJ SOLN: 0.0000 mg | Freq: Two times a day (BID) | INTRAMUSCULAR | Status: DC

## 2021-01-14 MED ORDER — PROPOFOL 500 MG/50ML IV EMUL
INTRAVENOUS | Status: DC | PRN
  Administered 2021-01-14: 145 ug/kg/min via INTRAVENOUS

## 2021-01-14 MED ORDER — FLUTICASONE PROPIONATE 50 MCG/ACT NA SUSP
1.0000 | Freq: Every day | NASAL | Status: DC
  Filled 2021-01-14: qty 16

## 2021-01-14 MED ORDER — FLUTICASONE PROPIONATE 50 MCG/ACT NA SUSP: 1.0000 | Freq: Every day | NASAL | 0 refills | Status: DC

## 2021-01-14 MED ORDER — LIDOCAINE HCL (CARDIAC) PF 100 MG/5ML IV SOSY
PREFILLED_SYRINGE | INTRAVENOUS | Status: DC | PRN
Start: 2021-01-14 — End: 2021-01-14
  Administered 2021-01-14: 100 mg via INTRAVENOUS

## 2021-01-14 MED ORDER — SERTRALINE HCL 50 MG PO TABS: 100.0000 mg | ORAL_TABLET | Freq: Every day | ORAL | Status: DC

## 2021-01-14 MED ORDER — SODIUM CHLORIDE 0.9% IV SOLUTION
Freq: Once | INTRAVENOUS | Status: DC
  Filled 2021-01-14: qty 250

## 2021-01-14 NOTE — Interval H&P Note (Signed)
History and Physical Interval Note:  01/14/2021 2:58 PM  Cameron Foster.  has presented today for surgery, with the diagnosis of Melena, GI blood loss anemia.  The various methods of treatment have been discussed with the patient and family. After consideration of risks, benefits and other options for treatment, the patient has consented to  Procedure(s): ESOPHAGOGASTRODUODENOSCOPY (EGD) (N/A) as a surgical intervention.  The patient's history has been reviewed, patient examined, no change in status, stable for surgery.  I have reviewed the patient's chart and labs.  Questions were answered to the patient's satisfaction.     [place], [place]

## 2021-01-14 NOTE — H&P (View-Only) (Signed)
[facility] GI Inpatient Consult Note   Kathline Magic, M.D.  Reason for Consult: Gastrointestinal bleeding, epigastric pain, melena, anemia secondary to gastrointestinal blood loss.   Attending Requesting Consult: Judd Gaudier, M.D.  History of Present Illness: Cameron Foster. is a 80 y.o. male is a pleasant 80 y/o male presenting with melena for the past 3-4 days. Patient reports nausea without vomiting.  Patient takes daily aspirin for history of cerebrovascular disease but takes no longer acting anticoagulants.  Denies any hematemesis or hematochezia.  No change in bowel habits.  Bowel habits are usually 1 bowel movement per day.  He had a episode of melena about 2 months ago for 3 days.  He stopped his iron supplement and did not resume it.  3 to 4 days ago black stools resumed but patient has not been taking iron.  He has a remote history of gastric ulcers on endoscopy performed in 07/04/16 by Dr. Gustavo Lah. Patient also complains of gnawing discomfort in the epigastrium after meals which can be relieved with having a bowel movement.  He denies any dysphagia, anorexia or involuntary weight loss. Patient has remote history of sigmoid colectomy for perforated diverticulitis.  He underwent colostomy reversal in December 2018.  Last optical colonoscopy was attempted in 07/04/16 by Dr. Gustavo Lah but procedure could not be completed due to perceived multiple diverticula with multiple angulations on the examination.  Patient reports he underwent a "virtual colonoscopy" in [place] thereafter that was largely unremarkable except for diverticulosis.  Couple weeks after that he had a spontaneous perforation which necessitated the partial colectomy noted above. In terms of risk factors, the patient says that he he drinks a glass of wine every night.  He admits to a history of heavy drinking in his 69s and 68s but denies drinking excessively in the last 20 years.  He drinks only wine at  1 glass per evening.  Past Medical History:  Past Medical History:  Diagnosis Date   Alcoholism ([place])    Arthritis    Atrial fibrillation ([place]) 1995   one episode   Basal cell carcinoma 04/26/2017   Right medial cheek. Superficial and nodular   Basal cell carcinoma 06/11/2019   Left anterior shoulder. Nodular pattern   Basal cell carcinoma 09/09/2019   Right nasal ala, [facility]   Basal cell carcinoma 02/05/2020   L upper eyebrow, [facility]    Carotid arterial disease ([place])    a. July 05, 2019 Carotid U/S: <50% bilat ICA stenoses.   Colon polyp    Depression    Son died 07-04-14   Diverticulitis    Diverticulosis 30 years   GERD (gastroesophageal reflux disease)    History of echocardiogram    a. 07-05-19 Echo: EF 55-60%, no rwma, triv MR/AI.   History of stress test    a. 09/2015 MV: No ischemia/infarct. EF 45-54% (nl by echo).   Hyperlipidemia    Hypertension    Irritable bowel syndrome    Prostate cancer ([place]) 07-04-2010   treated with radiation therapy. Prostate   PSVT (paroxysmal supraventricular tachycardia) ([place])    a. 06/2019 Zio: Avg rate 74 (54-120), occas PACs, rare PVCs, 125 episodes of PVCs (longest 17.5 secs; max rate 187). No afib.   Squamous cell carcinoma of skin 07/18/2017   Left medial calf. KA type   Squamous cell carcinoma of skin 04/24/2018   Right above med. brow   Squamous cell carcinoma of skin 06/11/2019   Right posterior shoulder. SCCis, hypertrophic  Squamous cell carcinoma of skin 01/09/2020   Mid nasal dorsum, MOHS, Efudex x 4wks   Vasovagal syncope     Problem List: Patient Active Problem List   Diagnosis Date Noted   Melena 01/13/2021   Acute blood loss anemia 01/13/2021   Alcohol use disorder, moderate, dependence ([place]) 01/13/2021   Stage 3a chronic kidney disease ([place]) 11/23/2020   Anemia 11/23/2020   Chronic pain of right ankle 05/21/2020   Encounter for attention to colostomy ([place]) 05/21/2020   Achilles tendinitis 04/30/2020   Synovitis and tenosynovitis  04/30/2020   Lymphedema 03/03/2020   Chronic venous insufficiency 03/03/2020   Fatigue 02/14/2020   Leg edema 02/14/2020   Foraminal stenosis of lumbar region 11/08/2019   Chronic bilateral low back pain without sciatica 10/18/2019   Foraminal stenosis of cervical region 10/18/2019   PSVT (paroxysmal supraventricular tachycardia) ([place]) 08/01/2019   Lacunar infarction ([place]) 07/10/2019   History of lacunar cerebrovascular accident (CVA) 07/01/2019   Cerebrovascular accident (CVA) ([place]) 06/08/2019   Paroxysmal atrial fibrillation ([place]) 11/07/2018   TIA (transient ischemic attack) 10/20/2018   History of subarachnoid hemorrhage 10/16/2018   Recurrent sinusitis 10/16/2018   Low libido 08/30/2018   Obesity 05/14/2018   Hyperlipidemia LDL goal <70 05/14/2018   Degeneration of lumbar intervertebral disc 03/22/2018   Osteoarthritis of hip 03/22/2018   Trochanteric bursitis of right hip 03/22/2018   History of colostomy reversal 03/21/2017   Status post partial colectomy 01/03/2017   Depression, major, single episode, mild ([place]) 01/03/2017   Vasovagal syncope 01/27/2016   History of skin cancer 10/28/2015   Osteoarthritis of right knee 10/01/2015   Essential hypertension 08/25/2015   Dyspnea on exertion 08/12/2015   Erectile dysfunction following radiation therapy 08/04/2015   Constipation 08/02/2015   History of prostate cancer 01/20/2015   Swelling of right lower extremity 01/20/2015    Past Surgical History: Past Surgical History:  Procedure Laterality Date   [place] no stents   CATARACT EXTRACTION, BILATERAL     COLON RESECTION SIGMOID N/A 12/07/2016   Procedure: COLON RESECTION SIGMOID;  Surgeon: Clayburn Pert, MD;  Location: [facility] ORS;  Service: General;  Laterality: N/A;   COLON SURGERY  11/2016   Colostomy   COLONOSCOPY  2016   COLONOSCOPY WITH PROPOFOL N/A 05/30/2016   Procedure: COLONOSCOPY WITH PROPOFOL;  Surgeon: Lollie Sails, MD;  Location: [facility] ENDOSCOPY;  Service: Endoscopy;  Laterality: N/A;   COLOSTOMY Left 12/07/2016   Procedure: COLOSTOMY;  Surgeon: Clayburn Pert, MD;  Location: [facility] ORS;  Service: General;  Laterality: Left;   COLOSTOMY REVERSAL N/A 03/21/2017   Procedure: COLOSTOMY REVERSAL;  Surgeon: Clayburn Pert, MD;  Location: [facility] ORS;  Service: General;  Laterality: N/A;   COLOSTOMY TAKEDOWN N/A 03/21/2017   Procedure: LAPAROSCOPIC COLOSTOMY TAKEDOWN;  Surgeon: Clayburn Pert, MD;  Location: [facility] ORS;  Service: General;  Laterality: N/A;   CYSTOSCOPY WITH STENT PLACEMENT Bilateral 03/21/2017   Procedure: CYSTOSCOPY WITH LIGHTED STENT PLACEMENT;  Surgeon: Abbie Sons, MD;  Location: [facility] ORS;  Service: Urology;  Laterality: Bilateral;   ESOPHAGOGASTRODUODENOSCOPY     ESOPHAGOGASTRODUODENOSCOPY N/A 05/30/2016   Procedure: ESOPHAGOGASTRODUODENOSCOPY (EGD);  Surgeon: Lollie Sails, MD;  [facility] ENDOSCOPY;  Service: Endoscopy;  Laterality: N/A;   EYE SURGERY     cataracts   FLEXIBLE SIGMOIDOSCOPY N/A 03/21/2017   Procedure: FLEXIBLE SIGMOIDOSCOPY;  Surgeon: Clayburn Pert, MD;  Location: [facility] ORS;  Service: General;  Laterality: N/A;   FRACTURE SURGERY Bilateral  right arm and left wrist   INCISION AND DRAINAGE ABSCESS N/A 12/07/2016   Procedure: DRAINAGE  OF INTRA ABDOMINAL ABSCESS;  Surgeon: Clayburn Pert, MD;  Location: [facility] ORS;  Service: General;  Laterality: N/A;   KNEE ARTHROSCOPY     LAPAROTOMY N/A 12/07/2016   Procedure: EXPLORATORY LAPAROTOMY;  Surgeon: Clayburn Pert, MD;  Location: [facility] ORS;  Service: General;  Laterality: N/A;   PROSTATE SURGERY     Microwave therapy   TONSILLECTOMY     TOTAL KNEE ARTHROPLASTY Right 07/27/2016   Procedure: TOTAL KNEE ARTHROPLASTY;  Surgeon: [name], MD;  Location: [facility] ORS;  Service: Orthopedics;  Laterality: Right;  Dr. Erlene Quan had to place Urinary catheter due to prostate cancer history.  Using flexible scope.     Allergies: Allergies  Allergen Reactions   Formaldehyde Rash    Home Medications: (Not in a hospital admission)  Home medication reconciliation was completed with the patient.   Scheduled Inpatient Medications:    sodium chloride   Intravenous Once   fluticasone  1 spray Each Nare Daily   folic acid  1 mg Oral Daily   [START ON 01/15/2021] LORazepam  0-4 mg Intravenous Q12H   pantoprazole (PROTONIX) IV  40 mg Intravenous Q12H   thiamine  100 mg Oral Daily   Or   thiamine injection  100 mg Intravenous Daily    Continuous Inpatient Infusions:    sodium chloride 50 mL/hr at 01/14/21 1036    PRN Inpatient Medications:  acetaminophen **OR** acetaminophen, LORazepam **OR** LORazepam, ondansetron (ZOFRAN) IV  Family History: family history includes Bipolar disorder in his son; Heart disease in his son; Kidney disease in his daughter; Lung cancer in his father; Other in his mother; Sudden death in his son.   GI Family History: Negative  Social History:   reports that he quit smoking about 32 years ago. His smoking use included cigarettes. He has a 27.00 pack-year smoking history. His smokeless tobacco use includes chew. He reports current alcohol use of about 7.0 standard drinks per week. He reports that he does not use drugs. The patient denies ETOH, tobacco, or drug use.    Review of Systems: Review of Systems - Negative except that in HPI.  Physical Examination: BP 130/80   Pulse 77   Temp 98.8 F (37.1 C)   Resp 16   Ht 5' 8"  (1.727 m)   Wt 97.2 kg   SpO2 98%   BMI 32.58 kg/m  Physical Exam Constitutional:      General: He is not in acute distress.    Appearance: Normal appearance. He is normal weight. He is not toxic-appearing.  HENT:     Head: Normocephalic and atraumatic.     Nose: No congestion or rhinorrhea.     Mouth/Throat:     Mouth: Mucous membranes are moist.     Pharynx: Oropharynx is clear.  Eyes:     Conjunctiva/sclera: Conjunctivae normal.      Pupils: Pupils are equal, round, and reactive to light.  Cardiovascular:     Rate and Rhythm: Normal rate.     Pulses: Normal pulses.     Heart sounds: No murmur heard.   No gallop.  Pulmonary:     Effort: Pulmonary effort is normal. No respiratory distress.     Breath sounds: Normal breath sounds.  Abdominal:     General: There is no distension.     Palpations: Abdomen is soft. There is no mass.     Tenderness: There is  no abdominal tenderness.  Musculoskeletal:        General: No swelling or signs of injury.     Right lower leg: No edema.     Left lower leg: No edema.  Skin:    General: Skin is warm and dry.  Neurological:     General: No focal deficit present.     Mental Status: He is alert.  Psychiatric:        Mood and Affect: Mood normal.        Behavior: Behavior normal.        Thought Content: Thought content normal.        Judgment: Judgment normal.    Data: Lab Results  Component Value Date   WBC 7.4 01/13/2021   HGB 8.5 (L) 01/14/2021   HCT 29.8 (L) 01/13/2021   MCV 93.7 01/13/2021   PLT 234 01/13/2021   Recent Labs  Lab 01/13/21 1522 01/14/21 0039 01/14/21 0602  HGB 9.8* 7.9* 8.5*   Lab Results  Component Value Date   NA 138 01/13/2021   K 4.1 01/13/2021   CL 105 01/13/2021   CO2 25 01/13/2021   BUN 53 (H) 01/13/2021   CREATININE 1.27 (H) 01/13/2021   GLU 102 10/12/2018   Lab Results  Component Value Date   ALT 15 01/13/2021   AST 19 01/13/2021   ALKPHOS 60 01/13/2021   BILITOT 0.7 01/13/2021   No results for input(s): APTT, INR, PTT in the last 168 hours. CBC Latest Ref Rng & Units 01/14/2021 01/14/2021 01/13/2021  WBC 4.0 - 10.5 K/uL - - 7.4  Hemoglobin 13.0 - 17.0 g/dL 8.5(L) 7.9(L) 9.8(L)  Hematocrit 39.0 - 52.0 % - - 29.8(L)  Platelets 150 - 400 K/uL - - 234    STUDIES: CT ABDOMEN PELVIS W CONTRAST  Result Date: 01/13/2021 CLINICAL DATA:  Epigastric pain, rectal bleeding, dark stools EXAM: CT ABDOMEN AND PELVIS WITH CONTRAST  TECHNIQUE: Multidetector CT imaging of the abdomen and pelvis was performed using the standard protocol following bolus administration of intravenous contrast. CONTRAST:  79m OMNIPAQUE IOHEXOL 350 MG/ML SOLN COMPARISON:  03/14/2020 FINDINGS: Lower chest: No acute pleural or parenchymal lung disease. Hepatobiliary: Stable small hepatic cysts. No other focal liver abnormalities. No intrahepatic duct dilation. The gallbladder is unremarkable. Pancreas: Unremarkable. No pancreatic ductal dilatation or surrounding inflammatory changes. Spleen: Normal in size without focal abnormality. Adrenals/Urinary Tract: There are stable bilateral renal cysts. No urinary tract calculi or obstructive uropathy. The adrenals and bladder are grossly unremarkable. Stomach/Bowel: No bowel obstruction or ileus. Normal appendix right lower quadrant. There is diverticulosis throughout the distal colon without evidence of acute diverticulitis. Postsurgical changes are seen from prior sigmoid colon resection and reanastomosis. No bowel wall thickening or inflammatory change. Vascular/Lymphatic: Aortic atherosclerosis. No enlarged abdominal or pelvic lymph nodes. Reproductive: Fiduciary markers are again seen within the prostate. No gross abnormality. Other: No free fluid or free gas.  No abdominal wall hernia. Musculoskeletal: No acute or destructive bony lesions. Bilateral hip osteoarthritis right greater than left. Reconstructed images demonstrate no additional findings. IMPRESSION: 1. Distal colonic diverticulosis without diverticulitis. 2. No acute intra-abdominal or intrapelvic process. 3.  Aortic Atherosclerosis (ICD10-I70.0). Electronically Signed   By: MRanda NgoM.D.   On: 01/13/2021 18:43   @IMAGES @  Assessment:  Acute GI bleed, currently stable. Patient without  c/o BM since being seen almost 24 hrs ago. Patient has been admitted but is still stationed in the emergency room. Melena - likely UGI source. History of gastric  ulcer disease. Chronic NSAID use. Aspirin. Cerebrovascular disease. History of CKD Stage III.   COVID-19 status: Tested Negative   Recommendations: IV pantoprazole. NPO. Serial H/H. Transfuse as necessary from a medical standpoint. EGD. The patient understands the nature of the planned procedure, indications, risks, alternatives and potential complications including but not limited to bleeding, infection, perforation, damage to internal organs and possible oversedation/side effects from anesthesia. The patient agrees and gives consent to proceed.  Please refer to procedure notes for findings, recommendations and patient disposition/instructions.   Thank you for the consult. Please call with questions or concerns.  Olean Ree, "Lanny Hurst MD [facility] [place], [place] [address] [phone]  01/14/2021 1:13 PM

## 2021-01-14 NOTE — Telephone Encounter (Signed)
Noted  

## 2021-01-14 NOTE — Progress Notes (Signed)
Orders to discharge- pt will discharge from EGD

## 2021-01-14 NOTE — ED Notes (Signed)
Verbal consent obtained to administer blood by patient. Pt states this is his first transfusion. Witnessed and verified by Keene Breath, RN.

## 2021-01-14 NOTE — Progress Notes (Signed)
MD at bedside. 

## 2021-01-14 NOTE — Care Management CC44 (Signed)
Condition Code 44 Documentation Completed  Patient Details  Name: Cameron Foster. MRN: [id number] Date of Birth: 03-25-1941   Condition Code 44 given:  Yes Patient signature on Condition Code 44 notice:  Yes Documentation of 2 MD's agreement:  Yes Code 44 added to claim:  Yes    Adelene Amas, [place] 01/14/2021, 3:59 PM

## 2021-01-14 NOTE — Transfer of Care (Signed)
Immediate Anesthesia Transfer of Care Note  Patient: Cameron Foster.  Procedure(s) Performed: ESOPHAGOGASTRODUODENOSCOPY (EGD)  Patient Location: Endoscopy Unit  Anesthesia Type:General  Level of Consciousness: drowsy and patient cooperative  Airway & Oxygen Therapy: Patient Spontanous Breathing and Patient connected to face mask oxygen  Post-op Assessment: Report given to RN and Post -op Vital signs reviewed and stable  Post vital signs: Reviewed and stable  Last Vitals:  Vitals Value Taken Time  BP 105/71 01/14/21 1510  Temp    Pulse 71 01/14/21 1511  Resp 13 01/14/21 1511  SpO2 100 % 01/14/21 1511  Vitals shown include unvalidated device data.  Last Pain:  Vitals:   01/14/21 1439  TempSrc: Temporal  PainSc: 0-No pain         Complications: No notable events documented.

## 2021-01-14 NOTE — Discharge Summary (Signed)
Physician Discharge Summary   Cameron Foster.  male DOB: July 09, 1940  INO:[id number]  PCP: Cameron Crews, MD  Admit date: 01/13/2021 Discharge date: 01/14/2021  Admitted From: home Disposition:  home CODE STATUS: Full code  Discharge Instructions     Discharge instructions   Complete by: As directed    EGD found non-bleeding pre-pyloric ulcer, gastritis, duodenitis. Low risk for rebleeding. Please take Protonix 32m twice a day and to follow up with GI Dr. TAlice Reichertin 3 weeks in the office.    [facility]  For full details, please see H&P, progress notes, consult notes and ancillary notes.  Briefly,  Cameron Foster is a 80y.o. male with medical history significant for Paroxysmal atrial fibrillation, not on anticoagulation due to history of subarachnoid hemorrhage, history of diverticulitis s/p sigmoid colectomy, lacunar CVA, HTN, CKD stage IIIa, chronic venous insufficiency and alcohol use disorder, who presents to the ED with a complaint of dark tarry stool.      Melena   Acute blood loss anemia, symptomatic -3 weeks of intermittent, now consistent dark tarry stool symptomatic for dizziness in the setting of daily alcohol use.  No NSAID use and no blood thinners - Hemoglobin 9.6, down from baseline of 12.5 - received IV Protonix twice daily.  IV hydration --EGD found non-bleeding pre-pyloric ulcer, gastritis, duodenitis. Low risk for rebleeding. Pt discharged on Protonix 452mBID and to follow up with GI Dr. ToAlice Reichertn 3 weeks in the office.      Paroxysmal atrial fibrillation (HCC) --not on anticoagulation du to History of subarachnoid hemorrhage - Patient with intermittent A. fib, followed by cardiology - Toprol was not ordered on admission.  Resumed at discharge.     History of cerebrovascular accident (CVA) - aspirin resumed at discharge.  Continue atorvastatin     Alcohol use disorder, moderate, dependence ([facility]-  Patient with daily alcohol use.  Drinks wine, estimated around 12 ounces daily --cessation advised.     Essential hypertension --home BP meds were not ordered on admission.  resumed after discharge.     History of sigmoid colectomy secondary to diverticulitis - CT abdomen and pelvis showing diverticulosis without diverticulitis and otherwise stable findings   Chronic venous insufficiency - Lasix was not ordered on admission.  Resumed after discharge.     Stage 3a chronic kidney disease (H[place]- Renal function at baseline   Discharge Diagnoses:  Principal Problem:   Melena Active Problems:   Essential hypertension   History of colostomy reversal   History of subarachnoid hemorrhage   Paroxysmal atrial fibrillation (HCC)   History of lacunar cerebrovascular accident (CVA)   Chronic venous insufficiency   Stage 3a chronic kidney disease (HCC)   Acute blood loss anemia   Alcohol use disorder, moderate, dependence (HCFin[place]  30 Day Unplanned Readmission Risk Score    Flowsheet Row ED to Hosp-Admission (Current) from [place]/2021 [id number] Un[place] Read[place] Risk Score (%) 10.64 Filed at 01/14/2021 1200       This score is the patient's risk of an unplanned readmission within 30 days of being discharged (0 -100%). The score is based on dignosis, age, lab data, medications, orders, and past utilization.   Low:  0-14.9   Medium: 15-21.9   High: 22-29.9   Extreme: 30 and above         Discharge Instructions:  Allergies  as of 01/14/2021       Reactions   Formaldehyde Rash        Medication List     STOP taking these medications    meloxicam 15 MG tablet Commonly known as: MOBIC   omeprazole 20 MG capsule Commonly known as: PRILOSEC       TAKE these medications    amLODipine 10 MG tablet Commonly known as: NORVASC Take 10 mg by mouth daily.   aspirin 81 MG EC tablet Take 81 mg by mouth daily.   atorvastatin 40  MG tablet Commonly known as: LIPITOR TAKE 1 TABLET BY MOUTH EVERY DAY   fluticasone 50 MCG/ACT nasal spray Commonly known as: FLONASE Place 1 spray into both nostrils daily for 14 days.   furosemide 20 MG tablet Commonly known as: LASIX TAKE 1 TABLET BY MOUTH EVERY DAY   hydrocortisone 2.5 % cream APPLY TO AFFECTED AREA TWICE A DAY AS NEEDED FOR RASH   lisinopril 40 MG tablet Commonly known as: ZESTRIL TAKE 1 TABLET BY MOUTH EVERY DAY   meclizine 25 MG tablet Commonly known as: ANTIVERT Take 25 mg by mouth 2 (two) times daily as needed.   metoprolol succinate 50 MG 24 hr tablet Commonly known as: TOPROL-XL TAKE 1 TABLET BY MOUTH DAILY. TAKE WITH OR IMMEDIATELY FOLLOWING A MEAL.   multivitamin with minerals Tabs tablet Take 1 tablet by mouth daily.   pantoprazole 40 MG tablet Commonly known as: Protonix Take 1 tablet (40 mg total) by mouth 2 (two) times daily. To heal ulcers.   sertraline 50 MG tablet Commonly known as: ZOLOFT Take 2 tablets (100 mg total) by mouth daily.   sildenafil 20 MG tablet Commonly known as: REVATIO Take 1 tablet (20 mg total) by mouth daily.   VITAMIN B 12 PO Take 1,000 mcg by mouth daily.         Follow-up [place], MD Follow up in 3 week(s).   Specialty: Gastroenterology Contact information: [place] [address] [phone]         Cameron Crews, MD Follow up in 1 week(s).   Specialty: Family Medicine Contact information: [address] [place] [address] [phone]                 Allergies  Allergen Reactions   Formaldehyde Rash     The results of significant diagnostics from this hospitalization (including imaging, microbiology, ancillary and laboratory) are listed below for reference.   Consultations:   Procedures/Studies: CT ABDOMEN PELVIS W CONTRAST  Result Date: 01/13/2021 CLINICAL DATA:  Epigastric pain, rectal  bleeding, dark stools EXAM: CT ABDOMEN AND PELVIS WITH CONTRAST TECHNIQUE: Multidetector CT imaging of the abdomen and pelvis was performed using the standard protocol following bolus administration of intravenous contrast. CONTRAST:  38m OMNIPAQUE IOHEXOL 350 MG/ML SOLN COMPARISON:  03/14/2020 FINDINGS: Lower chest: No acute pleural or parenchymal lung disease. Hepatobiliary: Stable small hepatic cysts. No other focal liver abnormalities. No intrahepatic duct dilation. The gallbladder is unremarkable. Pancreas: Unremarkable. No pancreatic ductal dilatation or surrounding inflammatory changes. Spleen: Normal in size without focal abnormality. Adrenals/Urinary Tract: There are stable bilateral renal cysts. No urinary tract calculi or obstructive uropathy. The adrenals and bladder are grossly unremarkable. Stomach/Bowel: No bowel obstruction or ileus. Normal appendix right lower quadrant. There is diverticulosis throughout the distal colon without evidence of acute diverticulitis. Postsurgical changes are seen from prior sigmoid colon resection and reanastomosis. No bowel  wall thickening or inflammatory change. Vascular/Lymphatic: Aortic atherosclerosis. No enlarged abdominal or pelvic lymph nodes. Reproductive: Fiduciary markers are again seen within the prostate. No gross abnormality. Other: No free fluid or free gas.  No abdominal wall hernia. Musculoskeletal: No acute or destructive bony lesions. Bilateral hip osteoarthritis right greater than left. Reconstructed images demonstrate no additional findings. IMPRESSION: 1. Distal colonic diverticulosis without diverticulitis. 2. No acute intra-abdominal or intrapelvic process. 3.  Aortic Atherosclerosis (ICD10-I70.0). Electronically Signed   By: Randa Ngo M.D.   On: 01/13/2021 18:43      Labs: BNP (last 3 results) No results for input(s): BNP in the last 8760 hours. Basic Metabolic Panel: Recent Labs  Lab 01/13/21 1522  NA 138  K 4.1  CL 105  CO2  25  GLUCOSE 115*  BUN 53*  CREATININE 1.27*  CALCIUM 9.3   Liver Function Tests: Recent Labs  Lab 01/13/21 1522  AST 19  ALT 15  ALKPHOS 60  BILITOT 0.7  PROT 6.8  ALBUMIN 3.6   No results for input(s): LIPASE, AMYLASE in the last 168 hours. No results for input(s): AMMONIA in the last 168 hours. CBC: Recent Labs  Lab 01/13/21 1522 01/14/21 0039 01/14/21 0602  WBC 7.4  --   --   HGB 9.8* 7.9* 8.5*  HCT 29.8*  --   --   MCV 93.7  --   --   PLT 234  --   --    Cardiac Enzymes: No results for input(s): CKTOTAL, CKMB, CKMBINDEX, TROPONINI in the last 168 hours. BNP: Invalid input(s): POCBNP CBG: No results for input(s): GLUCAP in the last 168 hours. D-Dimer No results for input(s): DDIMER in the last 72 hours. Hgb A1c No results for input(s): HGBA1C in the last 72 hours. Lipid Profile No results for input(s): CHOL, HDL, LDLCALC, TRIG, CHOLHDL, LDLDIRECT in the last 72 hours. Thyroid function studies No results for input(s): TSH, T4TOTAL, T3FREE, THYROIDAB in the last 72 hours.  Invalid input(s): FREET3 Anemia work up No results for input(s): VITAMINB12, FOLATE, FERRITIN, TIBC, IRON, RETICCTPCT in the last 72 hours. Urinalysis    Component Value Date/Time   COLORURINE YELLOW (A) 06/08/2019 1738   APPEARANCEUR CLEAR (A) 06/08/2019 1738   APPEARANCEUR Clear 08/28/2017 0000   LABSPEC 1.015 06/08/2019 1738   PHURINE 5.0 06/08/2019 1738   GLUCOSEU NEGATIVE 06/08/2019 1738   HGBUR NEGATIVE 06/08/2019 1738   BILIRUBINUR NEGATIVE 06/08/2019 1738   BILIRUBINUR Negative 08/28/2017 0000   KETONESUR NEGATIVE 06/08/2019 1738   PROTEINUR NEGATIVE 06/08/2019 1738   UROBILINOGEN 0.2 04/19/2017 1559   NITRITE NEGATIVE 06/08/2019 1738   LEUKOCYTESUR NEGATIVE 06/08/2019 1738   Sepsis Labs Invalid input(s): PROCALCITONIN,  WBC,  LACTICIDVEN Microbiology Recent Results (from the past 240 hour(s))  SARS CORONAVIRUS 2 (TAT 6-24 HRS) Nasopharyngeal Nasopharyngeal Swab      Status: None   Collection Time: 01/14/21  2:19 AM   Specimen: Nasopharyngeal Swab  Result Value Ref Range Status   SARS Coronavirus 2 NEGATIVE NEGATIVE Final    Comment: (NOTE) SARS-CoV-2 target nucleic acids are NOT DETECTED.  The SARS-CoV-2 RNA is generally detectable in upper and lower respiratory specimens during the acute phase of infection. Negative results do not preclude SARS-CoV-2 infection, do not rule out co-infections with other pathogens, and should not be used as the sole basis for treatment or other patient management decisions. Negative results must be combined with clinical observations, patient history, and epidemiological information. The expected result is Negative.  Fact Sheet for  Patients: SugarRoll.be  Fact Sheet for Healthcare Providers: https://www.woods-mathews.com/  This test is not yet approved or cleared by the [place] FDA and  has been authorized for detection and/or diagnosis of SARS-CoV-2 by FDA under an Emergency Use Authorization (EUA). This EUA will remain  in effect (meaning this test can be used) for the duration of the COVID-19 declaration under Se ction 564(b)(1) of the Act, 21 U.S.C. section 360bbb-3(b)(1), unless the authorization is terminated or revoked sooner.  Performed at [facility], [place] [address] [place], [place] [address]      Total time spend on discharging this patient, including the last patient exam, discussing the hospital stay, instructions for ongoing care as it relates to all pertinent caregivers, as well as preparing the medical discharge records, prescriptions, and/or referrals as applicable, is 40 minutes.    Enzo Bi, MD  [facility] 01/14/2021, 3:35 PM

## 2021-01-14 NOTE — Consult Note (Signed)
[facility] GI Inpatient Consult Note   Kathline Magic, M.D.  Reason for Consult: Gastrointestinal bleeding, epigastric pain, melena, anemia secondary to gastrointestinal blood loss.   Attending Requesting Consult: Judd Gaudier, M.D.  History of Present Illness: Cameron Foster. is a 80 y.o. male is a pleasant 80 y/o male presenting with melena for the past 3-4 days. Patient reports nausea without vomiting.  Patient takes daily aspirin for history of cerebrovascular disease but takes no longer acting anticoagulants.  Denies any hematemesis or hematochezia.  No change in bowel habits.  Bowel habits are usually 1 bowel movement per day.  He had a episode of melena about 2 months ago for 3 days.  He stopped his iron supplement and did not resume it.  3 to 4 days ago black stools resumed but patient has not been taking iron.  He has a remote history of gastric ulcers on endoscopy performed in 17-Jun-2016 by Dr. Gustavo Lah. Patient also complains of gnawing discomfort in the epigastrium after meals which can be relieved with having a bowel movement.  He denies any dysphagia, anorexia or involuntary weight loss. Patient has remote history of sigmoid colectomy for perforated diverticulitis.  He underwent colostomy reversal in December 2018.  Last optical colonoscopy was attempted in 2016-06-17 by Dr. Gustavo Lah but procedure could not be completed due to perceived multiple diverticula with multiple angulations on the examination.  Patient reports he underwent a "virtual colonoscopy" in [place] thereafter that was largely unremarkable except for diverticulosis.  Couple weeks after that he had a spontaneous perforation which necessitated the partial colectomy noted above. In terms of risk factors, the patient says that he he drinks a glass of wine every night.  He admits to a history of heavy drinking in his 84s and 1s but denies drinking excessively in the last 20 years.  He drinks only wine at  1 glass per evening.  Past Medical History:  Past Medical History:  Diagnosis Date   Alcoholism ([place])    Arthritis    Atrial fibrillation ([place]) 1995   one episode   Basal cell carcinoma 04/26/2017   Right medial cheek. Superficial and nodular   Basal cell carcinoma 06/11/2019   Left anterior shoulder. Nodular pattern   Basal cell carcinoma 09/09/2019   Right nasal ala, EDC   Basal cell carcinoma 02/05/2020   L upper eyebrow, EDC    Carotid arterial disease ([place])    a. 06-18-19 Carotid U/S: <50% bilat ICA stenoses.   Colon polyp    Depression    Son died 06/17/2014   Diverticulitis    Diverticulosis 30 years   GERD (gastroesophageal reflux disease)    History of echocardiogram    a. 18-Jun-2019 Echo: EF 55-60%, no rwma, triv MR/AI.   History of stress test    a. 09/2015 MV: No ischemia/infarct. EF 45-54% (nl by echo).   Hyperlipidemia    Hypertension    Irritable bowel syndrome    Prostate cancer ([place]) 06/17/10   treated with radiation therapy. Prostate   PSVT (paroxysmal supraventricular tachycardia) ([place])    a. 06/2019 Zio: Avg rate 74 (54-120), occas PACs, rare PVCs, 125 episodes of PVCs (longest 17.5 secs; max rate 187). No afib.   Squamous cell carcinoma of skin 07/18/2017   Left medial calf. KA type   Squamous cell carcinoma of skin 04/24/2018   Right above med. brow   Squamous cell carcinoma of skin 06/11/2019   Right posterior shoulder. SCCis, hypertrophic  Squamous cell carcinoma of skin 01/09/2020   Mid nasal dorsum, MOHS, Efudex x 4wks   Vasovagal syncope     Problem List: Patient Active Problem List   Diagnosis Date Noted   Melena 01/13/2021   Acute blood loss anemia 01/13/2021   Alcohol use disorder, moderate, dependence ([place]) 01/13/2021   Stage 3a chronic kidney disease ([place]) 11/23/2020   Anemia 11/23/2020   Chronic pain of right ankle 05/21/2020   Encounter for attention to colostomy ([place]) 05/21/2020   Achilles tendinitis 04/30/2020   Synovitis and tenosynovitis  04/30/2020   Lymphedema 03/03/2020   Chronic venous insufficiency 03/03/2020   Fatigue 02/14/2020   Leg edema 02/14/2020   Foraminal stenosis of lumbar region 11/08/2019   Chronic bilateral low back pain without sciatica 10/18/2019   Foraminal stenosis of cervical region 10/18/2019   PSVT (paroxysmal supraventricular tachycardia) ([place]) 08/01/2019   Lacunar infarction ([place]) 07/10/2019   History of lacunar cerebrovascular accident (CVA) 07/01/2019   Cerebrovascular accident (CVA) ([place]) 06/08/2019   Paroxysmal atrial fibrillation ([place]) 11/07/2018   TIA (transient ischemic attack) 10/20/2018   History of subarachnoid hemorrhage 10/16/2018   Recurrent sinusitis 10/16/2018   Low libido 08/30/2018   Obesity 05/14/2018   Hyperlipidemia LDL goal <70 05/14/2018   Degeneration of lumbar intervertebral disc 03/22/2018   Osteoarthritis of hip 03/22/2018   Trochanteric bursitis of right hip 03/22/2018   History of colostomy reversal 03/21/2017   Status post partial colectomy 01/03/2017   Depression, major, single episode, mild ([place]) 01/03/2017   Vasovagal syncope 01/27/2016   History of skin cancer 10/28/2015   Osteoarthritis of right knee 10/01/2015   Essential hypertension 08/25/2015   Dyspnea on exertion 08/12/2015   Erectile dysfunction following radiation therapy 08/04/2015   Constipation 08/02/2015   History of prostate cancer 01/20/2015   Swelling of right lower extremity 01/20/2015    Past Surgical History: Past Surgical History:  Procedure Laterality Date   [place] no stents   CATARACT EXTRACTION, BILATERAL     COLON RESECTION SIGMOID N/A 12/07/2016   Procedure: COLON RESECTION SIGMOID;  Surgeon: Clayburn Pert, MD;  Location: [facility] ORS;  Service: General;  Laterality: N/A;   COLON SURGERY  11/2016   Colostomy   COLONOSCOPY  2016   COLONOSCOPY WITH PROPOFOL N/A 05/30/2016   Procedure: COLONOSCOPY WITH PROPOFOL;  Surgeon: Lollie Sails, MD;  Location: [facility] ENDOSCOPY;  Service: Endoscopy;  Laterality: N/A;   COLOSTOMY Left 12/07/2016   Procedure: COLOSTOMY;  Surgeon: Clayburn Pert, MD;  Location: [facility] ORS;  Service: General;  Laterality: Left;   COLOSTOMY REVERSAL N/A 03/21/2017   Procedure: COLOSTOMY REVERSAL;  Surgeon: Clayburn Pert, MD;  Location: [facility] ORS;  Service: General;  Laterality: N/A;   COLOSTOMY TAKEDOWN N/A 03/21/2017   Procedure: LAPAROSCOPIC COLOSTOMY TAKEDOWN;  Surgeon: Clayburn Pert, MD;  Location: [facility] ORS;  Service: General;  Laterality: N/A;   CYSTOSCOPY WITH STENT PLACEMENT Bilateral 03/21/2017   Procedure: CYSTOSCOPY WITH LIGHTED STENT PLACEMENT;  Surgeon: Abbie Sons, MD;  Location: [facility] ORS;  Service: Urology;  Laterality: Bilateral;   ESOPHAGOGASTRODUODENOSCOPY     ESOPHAGOGASTRODUODENOSCOPY N/A 05/30/2016   Procedure: ESOPHAGOGASTRODUODENOSCOPY (EGD);  Surgeon: Lollie Sails, MD;  Location[facility] ENDOSCOPY;  Service: Endoscopy;  Laterality: N/A;   EYE SURGERY     cataracts   FLEXIBLE SIGMOIDOSCOPY N/A 03/21/2017   Procedure: FLEXIBLE SIGMOIDOSCOPY;  Surgeon: Clayburn Pert, MD;  Location: [facility] ORS;  Service: General;  Laterality: N/A;   FRACTURE SURGERY Bilateral  right arm and left wrist   INCISION AND DRAINAGE ABSCESS N/A 12/07/2016   Procedure: DRAINAGE  OF INTRA ABDOMINAL ABSCESS;  Surgeon: Clayburn Pert, MD;  Location: [facility] ORS;  Service: General;  Laterality: N/A;   KNEE ARTHROSCOPY     LAPAROTOMY N/A 12/07/2016   Procedure: EXPLORATORY LAPAROTOMY;  Surgeon: Clayburn Pert, MD;  Location: [facility] ORS;  Service: General;  Laterality: N/A;   PROSTATE SURGERY     Microwave therapy   TONSILLECTOMY     TOTAL KNEE ARTHROPLASTY Right 07/27/2016   Procedure: TOTAL KNEE ARTHROPLASTY;  Surgeon: [name], MD;  Location: [facility] ORS;  Service: Orthopedics;  Laterality: Right;  Dr. Erlene Quan had to place Urinary catheter due to prostate cancer history.  Using flexible scope.     Allergies: Allergies  Allergen Reactions   Formaldehyde Rash    Home Medications: (Not in a hospital admission)  Home medication reconciliation was completed with the patient.   Scheduled Inpatient Medications:    sodium chloride   Intravenous Once   fluticasone  1 spray Each Nare Daily   folic acid  1 mg Oral Daily   [START ON 01/15/2021] LORazepam  0-4 mg Intravenous Q12H   pantoprazole (PROTONIX) IV  40 mg Intravenous Q12H   thiamine  100 mg Oral Daily   Or   thiamine injection  100 mg Intravenous Daily    Continuous Inpatient Infusions:    sodium chloride 50 mL/hr at 01/14/21 1036    PRN Inpatient Medications:  acetaminophen **OR** acetaminophen, LORazepam **OR** LORazepam, ondansetron (ZOFRAN) IV  Family History: family history includes Bipolar disorder in his son; Heart disease in his son; Kidney disease in his daughter; Lung cancer in his father; Other in his mother; Sudden death in his son.   GI Family History: Negative  Social History:   reports that he quit smoking about 32 years ago. His smoking use included cigarettes. He has a 27.00 pack-year smoking history. His smokeless tobacco use includes chew. He reports current alcohol use of about 7.0 standard drinks per week. He reports that he does not use drugs. The patient denies ETOH, tobacco, or drug use.    Review of Systems: Review of Systems - Negative except that in HPI.  Physical Examination: BP 130/80   Pulse 77   Temp 98.8 F (37.1 C)   Resp 16   Ht 5' 8"  (1.727 m)   Wt 97.2 kg   SpO2 98%   BMI 32.58 kg/m  Physical Exam Constitutional:      General: He is not in acute distress.    Appearance: Normal appearance. He is normal weight. He is not toxic-appearing.  HENT:     Head: Normocephalic and atraumatic.     Nose: No congestion or rhinorrhea.     Mouth/Throat:     Mouth: Mucous membranes are moist.     Pharynx: Oropharynx is clear.  Eyes:     Conjunctiva/sclera: Conjunctivae normal.      Pupils: Pupils are equal, round, and reactive to light.  Cardiovascular:     Rate and Rhythm: Normal rate.     Pulses: Normal pulses.     Heart sounds: No murmur heard.   No gallop.  Pulmonary:     Effort: Pulmonary effort is normal. No respiratory distress.     Breath sounds: Normal breath sounds.  Abdominal:     General: There is no distension.     Palpations: Abdomen is soft. There is no mass.     Tenderness: There is  no abdominal tenderness.  Musculoskeletal:        General: No swelling or signs of injury.     Right lower leg: No edema.     Left lower leg: No edema.  Skin:    General: Skin is warm and dry.  Neurological:     General: No focal deficit present.     Mental Status: He is alert.  Psychiatric:        Mood and Affect: Mood normal.        Behavior: Behavior normal.        Thought Content: Thought content normal.        Judgment: Judgment normal.    Data: Lab Results  Component Value Date   WBC 7.4 01/13/2021   HGB 8.5 (L) 01/14/2021   HCT 29.8 (L) 01/13/2021   MCV 93.7 01/13/2021   PLT 234 01/13/2021   Recent Labs  Lab 01/13/21 1522 01/14/21 0039 01/14/21 0602  HGB 9.8* 7.9* 8.5*   Lab Results  Component Value Date   NA 138 01/13/2021   K 4.1 01/13/2021   CL 105 01/13/2021   CO2 25 01/13/2021   BUN 53 (H) 01/13/2021   CREATININE 1.27 (H) 01/13/2021   GLU 102 10/12/2018   Lab Results  Component Value Date   ALT 15 01/13/2021   AST 19 01/13/2021   ALKPHOS 60 01/13/2021   BILITOT 0.7 01/13/2021   No results for input(s): APTT, INR, PTT in the last 168 hours. CBC Latest Ref Rng & Units 01/14/2021 01/14/2021 01/13/2021  WBC 4.0 - 10.5 K/uL - - 7.4  Hemoglobin 13.0 - 17.0 g/dL 8.5(L) 7.9(L) 9.8(L)  Hematocrit 39.0 - 52.0 % - - 29.8(L)  Platelets 150 - 400 K/uL - - 234    STUDIES: CT ABDOMEN PELVIS W CONTRAST  Result Date: 01/13/2021 CLINICAL DATA:  Epigastric pain, rectal bleeding, dark stools EXAM: CT ABDOMEN AND PELVIS WITH CONTRAST  TECHNIQUE: Multidetector CT imaging of the abdomen and pelvis was performed using the standard protocol following bolus administration of intravenous contrast. CONTRAST:  71m OMNIPAQUE IOHEXOL 350 MG/ML SOLN COMPARISON:  03/14/2020 FINDINGS: Lower chest: No acute pleural or parenchymal lung disease. Hepatobiliary: Stable small hepatic cysts. No other focal liver abnormalities. No intrahepatic duct dilation. The gallbladder is unremarkable. Pancreas: Unremarkable. No pancreatic ductal dilatation or surrounding inflammatory changes. Spleen: Normal in size without focal abnormality. Adrenals/Urinary Tract: There are stable bilateral renal cysts. No urinary tract calculi or obstructive uropathy. The adrenals and bladder are grossly unremarkable. Stomach/Bowel: No bowel obstruction or ileus. Normal appendix right lower quadrant. There is diverticulosis throughout the distal colon without evidence of acute diverticulitis. Postsurgical changes are seen from prior sigmoid colon resection and reanastomosis. No bowel wall thickening or inflammatory change. Vascular/Lymphatic: Aortic atherosclerosis. No enlarged abdominal or pelvic lymph nodes. Reproductive: Fiduciary markers are again seen within the prostate. No gross abnormality. Other: No free fluid or free gas.  No abdominal wall hernia. Musculoskeletal: No acute or destructive bony lesions. Bilateral hip osteoarthritis right greater than left. Reconstructed images demonstrate no additional findings. IMPRESSION: 1. Distal colonic diverticulosis without diverticulitis. 2. No acute intra-abdominal or intrapelvic process. 3.  Aortic Atherosclerosis (ICD10-I70.0). Electronically Signed   By: MRanda NgoM.D.   On: 01/13/2021 18:43   @IMAGES @  Assessment:  Acute GI bleed, currently stable. Patient without  c/o BM since being seen almost 24 hrs ago. Patient has been admitted but is still stationed in the emergency room. Melena - likely UGI source. History of gastric  ulcer disease. Chronic NSAID use. Aspirin. Cerebrovascular disease. History of CKD Stage III.   COVID-19 status: Tested Negative   Recommendations: IV pantoprazole. NPO. Serial H/H. Transfuse as necessary from a medical standpoint. EGD. The patient understands the nature of the planned procedure, indications, risks, alternatives and potential complications including but not limited to bleeding, infection, perforation, damage to internal organs and possible oversedation/side effects from anesthesia. The patient agrees and gives consent to proceed.  Please refer to procedure notes for findings, recommendations and patient disposition/instructions.   Thank you for the consult. Please call with questions or concerns.  Olean Ree, "Lanny Hurst MD [facility] [place], [place] [address] [phone]  01/14/2021 1:13 PM

## 2021-01-14 NOTE — Progress Notes (Signed)
Assumed care/ pt resting comfortably/ call bell within reach

## 2021-01-14 NOTE — ED Notes (Signed)
Informed Dr. Damita Dunnings that due to patients hgb being above 6, per the blood bank only 1 unit of blood could be transfused. Dr. Damita Dunnings okay with this.

## 2021-01-14 NOTE — Progress Notes (Signed)
Pt transported to EGD

## 2021-01-14 NOTE — Care Management Obs Status (Signed)
[place] NOTIFICATION   Patient Details  Name: Cameron Foster. MRN: [id number] Date of Birth: 20-Nov-1940   Medicare Observation Status Notification Given:  Yes    Adelene Amas, [place] 01/14/2021, 4:00 PM

## 2021-01-14 NOTE — Anesthesia Preprocedure Evaluation (Signed)
Anesthesia Evaluation  Patient identified by MRN, date of birth, ID band Patient awake    Reviewed: Allergy & Precautions, H&P , NPO status , Patient's Chart, lab work & pertinent test results, reviewed documented beta blocker date and time   Airway Mallampati: III   Neck ROM: full    Dental  (+) Edentulous Upper, Edentulous Lower   Pulmonary neg pulmonary ROS, Patient abstained from smoking., former smoker,    Pulmonary exam normal        Cardiovascular Exercise Tolerance: Poor hypertension, On Medications negative cardio ROS Normal cardiovascular examAtrial Fibrillation  Rhythm:regular Rate:Normal     Neuro/Psych PSYCHIATRIC DISORDERS Depression TIACVA, Residual Symptoms negative neurological ROS  negative psych ROS   GI/Hepatic negative GI ROS, Neg liver ROS, GERD  Medicated,  Endo/Other  negative endocrine ROS  Renal/GU Renal diseasenegative Renal ROS  negative genitourinary   Musculoskeletal   Abdominal   Peds  Hematology negative hematology ROS (+) Blood dyscrasia, anemia ,   Anesthesia Other Findings Past Medical History: No date: Alcoholism ([place]) No date: Arthritis 1995: Atrial fibrillation ([place])     Comment:  one episode 04/26/2017: Basal cell carcinoma     Comment:  Right medial cheek. Superficial and nodular 06/11/2019: Basal cell carcinoma     Comment:  Left anterior shoulder. Nodular pattern 09/09/2019: Basal cell carcinoma     Comment:  Right nasal ala, EDC 02/05/2020: Basal cell carcinoma     Comment:  L upper eyebrow, EDC  No date: Carotid arterial disease ([place])     Comment:  a. 06/16/2019 Carotid U/S: <50% bilat ICA stenoses. No date: Colon polyp No date: Depression     Comment:  Son died Jun 15, 2014 No date: Diverticulitis 1 years: Diverticulosis No date: GERD (gastroesophageal reflux disease) No date: History of echocardiogram     Comment:  a. 06/16/2019 Echo: EF 55-60%, no rwma, triv MR/AI. No  date: History of stress test     Comment:  a. 09/2015 MV: No ischemia/infarct. EF 45-54% (nl by               echo). No date: Hyperlipidemia No date: Hypertension No date: Irritable bowel syndrome 06-15-10: Prostate cancer ([place])     Comment:  treated with radiation therapy. Prostate No date: PSVT (paroxysmal supraventricular tachycardia) ([place])     Comment:  a. 06/2019 Zio: Avg rate 74 (54-120), occas PACs, rare               PVCs, 125 episodes of PVCs (longest 17.5 secs; max rate               187). No afib. 07/18/2017: Squamous cell carcinoma of skin     Comment:  Left medial calf. KA type 04/24/2018: Squamous cell carcinoma of skin     Comment:  Right above med. brow 06/11/2019: Squamous cell carcinoma of skin     Comment:  Right posterior shoulder. SCCis, hypertrophic 01/09/2020: Squamous cell carcinoma of skin     Comment:  Mid nasal dorsum, MOHS, Efudex x 4wks No date: Vasovagal syncope Past Surgical History: 1998: CARDIAC CATHETERIZATION     Comment:  [place] no stents No date: CATARACT EXTRACTION, BILATERAL 12/07/2016: COLON RESECTION SIGMOID; N/A     Comment:  Procedure: COLON RESECTION SIGMOID;  Surgeon: Clayburn Pert, MD;  Location: [facility] ORS;  Service: General;                Laterality: N/A;  11/2016: COLON SURGERY     Comment:  Colostomy 2016: COLONOSCOPY 05/30/2016: COLONOSCOPY WITH PROPOFOL; N/A     Comment:  Procedure: COLONOSCOPY WITH PROPOFOL;  Surgeon: Lollie Sails, MD;  Location: [facility] ENDOSCOPY;  Service:               Endoscopy;  Laterality: N/A; 12/07/2016: COLOSTOMY; Left     Comment:  Procedure: COLOSTOMY;  Surgeon: Clayburn Pert, MD;                Location: [facility] ORS;  Service: General;  Laterality: Left; 03/21/2017: COLOSTOMY REVERSAL; N/A     Comment:  Procedure: COLOSTOMY REVERSAL;  Surgeon: Clayburn Pert, MD;  Location: [facility] ORS;  Service: General;                Laterality: N/A; 03/21/2017:  COLOSTOMY TAKEDOWN; N/A     Comment:  Procedure: LAPAROSCOPIC COLOSTOMY TAKEDOWN;  Surgeon:               Clayburn Pert, MD;  Location: [facility] ORS;  Service:               General;  Laterality: N/A; 03/21/2017: CYSTOSCOPY WITH STENT PLACEMENT; Bilateral     Comment:  Procedure: CYSTOSCOPY WITH LIGHTED STENT PLACEMENT;                Surgeon: Abbie Sons, MD;  Location: [facility] ORS;                Service: Urology;  Laterality: Bilateral; No date: ESOPHAGOGASTRODUODENOSCOPY 05/30/2016: ESOPHAGOGASTRODUODENOSCOPY; N/A     Comment:  Procedure: ESOPHAGOGASTRODUODENOSCOPY (EGD);  Surgeon:               Lollie Sails, MD;  Location[facility] ENDOSCOPY;                Service: Endoscopy;  Laterality: N/A; No date: EYE SURGERY     Comment:  cataracts 03/21/2017: FLEXIBLE SIGMOIDOSCOPY; N/A     Comment:  Procedure: FLEXIBLE SIGMOIDOSCOPY;  Surgeon: Clayburn Pert, MD;  Location: [facility] ORS;  Service: General;                Laterality: N/A; No date: FRACTURE SURGERY; Bilateral     Comment:  right arm and left wrist 12/07/2016: INCISION AND DRAINAGE ABSCESS; N/A     Comment:  Procedure: DRAINAGE  OF INTRA ABDOMINAL ABSCESS;                Surgeon: Clayburn Pert, MD;  Location: [facility] ORS;                Service: General;  Laterality: N/A; No date: KNEE ARTHROSCOPY 12/07/2016: LAPAROTOMY; N/A     Comment:  Procedure: EXPLORATORY LAPAROTOMY;  Surgeon: Clayburn Pert, MD;  Location: [facility] ORS;  Service: General;                Laterality: N/A; No date: PROSTATE SURGERY     Comment:  Microwave therapy No date: TONSILLECTOMY 07/27/2016: TOTAL KNEE ARTHROPLASTY; Right     Comment:  Procedure: TOTAL KNEE ARTHROPLASTY;  Surgeon: [name], MD;  Location:  [facility] ORS;  Service: Orthopedics;                Laterality: Right;  Dr. Erlene Quan had to place Urinary               catheter due to prostate cancer history.  Using flexible               scope. BMI     Body Mass Index: 32.58 kg/m     Reproductive/Obstetrics negative OB ROS                             Anesthesia Physical Anesthesia Plan  ASA: 3 and emergent  Anesthesia Plan: General   Post-op Pain Management:    Induction:   PONV Risk Score and Plan:   Airway Management Planned:   Additional Equipment:   Intra-op Plan:   Post-operative Plan:   Informed Consent: I have reviewed the patients History and Physical, chart, labs and discussed the procedure including the risks, benefits and alternatives for the proposed anesthesia with the patient or authorized representative who has indicated his/her understanding and acceptance.     Dental Advisory Given  Plan Discussed with: CRNA  Anesthesia Plan Comments:         Anesthesia Quick Evaluation

## 2021-01-14 NOTE — Op Note (Signed)
[facility] Patient Name: Cameron Foster Procedure Date: 01/14/2021 2:56 PM MRN: [id number] Account #: [id number] Date of Birth: December 31, 1940 Admit Type: Inpatient Age: 80 Room: [facility] ENDO ROOM 1 Gender: Male Note Status: Finalized Instrument Name: Upper Endoscope [id number] Procedure:             Upper GI endoscopy Indications:           Acute post hemorrhagic anemia, Melena Providers:             Benay Pike. Alice Reichert MD, MD Referring MD:          No Local Md, MD (Referring MD) Medicines:             Propofol per Anesthesia Complications:         No immediate complications. Procedure:             Pre-Anesthesia Assessment:                        - The risks and benefits of the procedure and the                         sedation options and risks were discussed with the                         patient. All questions were answered and informed                         consent was obtained.                        - Patient identification and proposed procedure were                         verified prior to the procedure by the nurse. The                         procedure was verified in the procedure room.                        - ASA Grade Assessment: III - A patient with severe                         systemic disease.                        - After reviewing the risks and benefits, the patient                         was deemed in satisfactory condition to undergo the                         procedure.                        After obtaining informed consent, the endoscope was                         passed under direct vision. Throughout the procedure,  the patient's blood pressure, pulse, and oxygen                         saturations were monitored continuously. The                         Endosonoscope was introduced through the mouth, and                         advanced to the third part of duodenum. The upper GI                          endoscopy was accomplished without difficulty. The                         patient tolerated the procedure well. Findings:      The Z-line was irregular and was found in the distal esophagus.      Patchy mild inflammation characterized by erythema was found in the       gastric body. Biopsies were taken with a cold forceps for Helicobacter       pylori testing.      One non-bleeding cratered gastric ulcer with no stigmata of bleeding was       found in the prepyloric region of the stomach. The lesion was 8 mm in       largest dimension.      The duodenal bulb was normal.      Patchy mildly erythematous mucosa without active bleeding and with no       stigmata of bleeding was found in the second portion of the duodenum.      The third portion of the duodenum was normal.      The exam was otherwise without abnormality. Impression:            - Z-line irregular, in the distal esophagus.                        - Gastritis. Biopsied.                        - Non-bleeding gastric ulcer with no stigmata of                         bleeding.                        - Normal duodenal bulb.                        - Erythematous duodenopathy.                        - Normal third portion of the duodenum.                        - The examination was otherwise normal. Recommendation:        - Return patient to hospital ward for possible                         discharge same day.                        -  Advance diet as tolerated.                        - Use Protonix (pantoprazole) 40 mg PO BID.                        - Return to my office in 3 weeks.                        - DIscharge OK from a GI standpoint. Procedure Code(s):     --- Professional ---                        [phone], Esophagogastroduodenoscopy, flexible,                         transoral; with biopsy, single or multiple Diagnosis Code(s):     --- Professional ---                        K92.1, Melena (includes Hematochezia)                         D62, Acute posthemorrhagic anemia                        K31.89, Other diseases of stomach and duodenum                        K25.9, Gastric ulcer, unspecified as acute or chronic,                         without hemorrhage or perforation                        K29.70, Gastritis, unspecified, without bleeding                        K22.8, Other specified diseases of esophagus CPT copyright 2019 American Medical Association. All rights reserved. The codes documented in this report are preliminary and upon coder review may  be revised to meet current compliance requirements. Efrain Sella MD, MD 01/14/2021 3:13:54 PM This report has been signed electronically. Number of Addenda: 0 Note Initiated On: 01/14/2021 2:56 PM Estimated Blood Loss:  Estimated blood loss: none.      [facility]

## 2021-01-15 ENCOUNTER — Ambulatory Visit: Payer: Medicare Other | Admitting: Family Medicine

## 2021-01-15 ENCOUNTER — Encounter: Payer: Self-pay | Admitting: Internal Medicine

## 2021-01-15 LAB — TYPE AND SCREEN
ABO/RH(D): O POS
Antibody Screen: NEGATIVE
Unit division: 0

## 2021-01-15 LAB — BPAM RBC
Blood Product Expiration Date: 202210312359
ISSUE DATE / TIME: 202209290319
Unit Type and Rh: 5100

## 2021-01-18 LAB — SURGICAL PATHOLOGY

## 2021-01-19 NOTE — Anesthesia Postprocedure Evaluation (Signed)
Anesthesia Post Note  Patient: Cameron Foster.  Procedure(s) Performed: ESOPHAGOGASTRODUODENOSCOPY (EGD)  Patient location during evaluation: PACU Anesthesia Type: General Level of consciousness: awake and alert Pain management: pain level controlled Vital Signs Assessment: post-procedure vital signs reviewed and stable Respiratory status: spontaneous breathing, nonlabored ventilation, respiratory function stable and patient connected to nasal cannula oxygen Cardiovascular status: blood pressure returned to baseline and stable Postop Assessment: no apparent nausea or vomiting Anesthetic complications: no   No notable events documented.   Last Vitals:  Vitals:   01/14/21 1540 01/14/21 1550  BP: 134/79 (!) 144/78  Pulse: 76 71  Resp: 19 20  Temp:    SpO2: 98% 100%    Last Pain:  Vitals:   01/15/21 0731  TempSrc:   PainSc: 0-No pain                 Molli Barrows

## 2021-01-22 DIAGNOSIS — M1611 Unilateral primary osteoarthritis, right hip: Secondary | ICD-10-CM | POA: Diagnosis not present

## 2021-01-26 ENCOUNTER — Encounter: Payer: Self-pay | Admitting: Family Medicine

## 2021-01-26 ENCOUNTER — Other Ambulatory Visit: Payer: Self-pay

## 2021-01-26 ENCOUNTER — Ambulatory Visit (INDEPENDENT_AMBULATORY_CARE_PROVIDER_SITE_OTHER): Payer: Medicare Other | Admitting: Family Medicine

## 2021-01-26 VITALS — BP 144/91 | HR 73 | Temp 98.5°F | Wt 211.7 lb

## 2021-01-26 DIAGNOSIS — J324 Chronic pansinusitis: Secondary | ICD-10-CM

## 2021-01-26 DIAGNOSIS — R053 Chronic cough: Secondary | ICD-10-CM

## 2021-01-26 DIAGNOSIS — I10 Essential (primary) hypertension: Secondary | ICD-10-CM | POA: Diagnosis not present

## 2021-01-26 DIAGNOSIS — Z23 Encounter for immunization: Secondary | ICD-10-CM | POA: Diagnosis not present

## 2021-01-26 DIAGNOSIS — Z09 Encounter for follow-up examination after completed treatment for conditions other than malignant neoplasm: Secondary | ICD-10-CM | POA: Diagnosis not present

## 2021-01-26 DIAGNOSIS — R42 Dizziness and giddiness: Secondary | ICD-10-CM

## 2021-01-26 MED ORDER — MECLIZINE HCL 12.5 MG PO TABS: 12.5000 mg | ORAL_TABLET | Freq: Three times a day (TID) | ORAL | 0 refills | Status: DC | PRN

## 2021-01-26 MED ORDER — PREDNISONE 20 MG PO TABS
20.0000 mg | ORAL_TABLET | Freq: Every day | ORAL | 0 refills | Status: DC
Start: 2021-01-26 — End: 2021-02-23

## 2021-01-26 MED ORDER — GUAIFENESIN-DM 100-10 MG/5ML PO SYRP: 5.0000 mL | ORAL_SOLUTION | ORAL | 0 refills | Status: DC | PRN

## 2021-01-26 NOTE — Assessment & Plan Note (Signed)
Refill of antivert provided ENT referral pending

## 2021-01-26 NOTE — Assessment & Plan Note (Signed)
Cough syrup provided

## 2021-01-26 NOTE — Assessment & Plan Note (Signed)
Recommend OTC treatment options Referral to ENT R ear cleaned

## 2021-01-26 NOTE — Assessment & Plan Note (Signed)
Denies complaints of melena BMs normal No complications with PPI Dry, non productive cough remains

## 2021-01-26 NOTE — Progress Notes (Signed)
Established patient visit   Patient: Cameron Foster.   DOB: 06-Dec-1940   80 y.o. Male  MRN: [id number] Visit Date: 01/26/2021  Today's healthcare provider: Gwyneth Sprout, FNP   Chief Complaint  Patient presents with   Follow-up   Subjective    HPI  Follow up ER visit  Patient was seen in ER for dark tarry stools on 01/13/21. He was treated for Melena. Treatment for this included prescribing Protonix 61m BID. He reports excellent compliance with treatment. He reports this condition is Improved.  -----------------------------------------------------------------------------------------     Medications: Outpatient Medications Prior to Visit  Medication Sig   amLODipine (NORVASC) 10 MG tablet Take 10 mg by mouth daily.   aspirin 81 MG EC tablet Take 81 mg by mouth daily.    atorvastatin (LIPITOR) 40 MG tablet TAKE 1 TABLET BY MOUTH EVERY DAY   fluticasone (FLONASE) 50 MCG/ACT nasal spray Place 1 spray into both nostrils daily for 14 days.   furosemide (LASIX) 20 MG tablet TAKE 1 TABLET BY MOUTH EVERY DAY   hydrocortisone 2.5 % cream APPLY TO AFFECTED AREA TWICE A DAY AS NEEDED FOR RASH   lisinopril (ZESTRIL) 40 MG tablet TAKE 1 TABLET BY MOUTH EVERY DAY   metoprolol succinate (TOPROL-XL) 50 MG 24 hr tablet TAKE 1 TABLET BY MOUTH DAILY. TAKE WITH OR IMMEDIATELY FOLLOWING A MEAL.   Multiple Vitamin (MULTIVITAMIN WITH MINERALS) TABS tablet Take 1 tablet by mouth daily.   pantoprazole (PROTONIX) 40 MG tablet Take 1 tablet (40 mg total) by mouth 2 (two) times daily. To heal ulcers.   sertraline (ZOLOFT) 50 MG tablet Take 2 tablets (100 mg total) by mouth daily.   [DISCONTINUED] Cyanocobalamin (VITAMIN B 12 PO) Take 1,000 mcg by mouth daily.   [DISCONTINUED] meclizine (ANTIVERT) 25 MG tablet Take 25 mg by mouth 2 (two) times daily as needed.   [DISCONTINUED] sildenafil (REVATIO) 20 MG tablet Take 1 tablet (20 mg total) by mouth daily.   No facility-administered medications  prior to visit.    Review of Systems     Objective    BP (!) 144/91   Pulse 73   Temp 98.5 F (36.9 C) (Oral)   Wt 211 lb 11.2 oz (96 kg)   BMI 32.19 kg/m    Physical Exam Vitals and nursing note reviewed.  Constitutional:      Appearance: Normal appearance. He is obese.  HENT:     Head: Normocephalic and atraumatic.     Right Ear: Tympanic membrane, ear canal and external ear normal. There is impacted cerumen.     Left Ear: Tympanic membrane, ear canal and external ear normal.     Ears:     Comments: R ear cleaned- per pt request Eyes:     Pupils: Pupils are equal, round, and reactive to light.  Cardiovascular:     Rate and Rhythm: Normal rate and regular rhythm.     Pulses: Normal pulses.     Heart sounds: Normal heart sounds.  Pulmonary:     Effort: Pulmonary effort is normal.     Breath sounds: Normal breath sounds.     Comments: Non-productive dry cough Musculoskeletal:        General: Normal range of motion.     Cervical back: Normal range of motion. Tenderness present.  Lymphadenopathy:     Cervical: Cervical adenopathy present.  Skin:    General: Skin is warm and dry.     Capillary Refill: Capillary  refill takes less than 2 seconds.  Neurological:     General: No focal deficit present.     Mental Status: He is alert and oriented to person, place, and time. Mental status is at baseline.     No results found for any visits on 01/26/21.  Assessment & Plan     Problem List Items Addressed This Visit       Cardiovascular and Mediastinum   Essential hypertension    Chronic, elevated today Pt with ongoing resp. Cough Continue to monitor        Respiratory   Chronic pansinusitis    Recommend OTC treatment options Referral to ENT R ear cleaned       Relevant Medications   predniSONE (DELTASONE) 20 MG tablet   guaiFENesin-dextromethorphan (ROBITUSSIN DM) 100-10 MG/5ML syrup   Other Relevant Orders   Ambulatory referral to ENT     Other    Dizziness    Refill of antivert provided ENT referral pending      Relevant Medications   meclizine (ANTIVERT) 12.5 MG tablet   Chronic cough    Cough syrup provided      Relevant Medications   predniSONE (DELTASONE) 20 MG tablet   guaiFENesin-dextromethorphan (ROBITUSSIN DM) 100-10 MG/5ML syrup   Need for influenza vaccination    Provided today      Relevant Orders   Flu Vaccine QUAD High Dose(Fluad)   Hospital discharge follow-up - Primary    Denies complaints of melena BMs normal No complications with PPI Dry, non productive cough remains        Return if symptoms worsen or fail to improve.     Vonna Kotyk, FNP, have reviewed all documentation for this visit. The documentation on 01/26/21 for the exam, diagnosis, procedures, and orders are all accurate and complete.    Gwyneth Sprout, [place] [phone] (phone) [phone] (fax)  [place]

## 2021-01-26 NOTE — Assessment & Plan Note (Signed)
Provided today

## 2021-01-26 NOTE — Assessment & Plan Note (Signed)
Chronic, elevated today Pt with ongoing resp. Cough Continue to monitor

## 2021-01-26 NOTE — Patient Instructions (Addendum)
Take Zyrtec/Claritin by mouth, daily  Use flonase, two sprays, each nostril, morning, night  Take cough syrup as needed  Prednisone- start Wednesday- take in AM  Referral in for ENT- will call you  Refill of dizziness medication- "antivert"

## 2021-01-28 DIAGNOSIS — R059 Cough, unspecified: Secondary | ICD-10-CM | POA: Diagnosis not present

## 2021-01-28 DIAGNOSIS — K219 Gastro-esophageal reflux disease without esophagitis: Secondary | ICD-10-CM | POA: Diagnosis not present

## 2021-01-28 DIAGNOSIS — J310 Chronic rhinitis: Secondary | ICD-10-CM | POA: Diagnosis not present

## 2021-01-30 ENCOUNTER — Encounter: Payer: Self-pay | Admitting: Family Medicine

## 2021-01-30 DIAGNOSIS — N1831 Chronic kidney disease, stage 3a: Secondary | ICD-10-CM

## 2021-01-30 DIAGNOSIS — I480 Paroxysmal atrial fibrillation: Secondary | ICD-10-CM

## 2021-01-30 DIAGNOSIS — I10 Essential (primary) hypertension: Secondary | ICD-10-CM

## 2021-01-30 DIAGNOSIS — Z8673 Personal history of transient ischemic attack (TIA), and cerebral infarction without residual deficits: Secondary | ICD-10-CM

## 2021-02-02 ENCOUNTER — Telehealth: Payer: Self-pay | Admitting: *Deleted

## 2021-02-02 NOTE — Chronic Care Management (AMB) (Signed)
  Chronic Care Management   Outreach Note  02/02/2021 Name: Creighton Longley. MRN: [id number] DOB: 1940-08-23  Giovany Cosby. is a 80 y.o. year old male who is a primary care patient of Brita Romp, Dionne Bucy, MD. I reached out to [employer]. by phone today in response to a referral sent by Mr. Duffy Bruce Jr.'s primary care provider.  A telephone outreach was attempted today. The patient was referred to the case management team for assistance with care management and care coordination.   Follow Up Plan: The care management team will reach out to the patient again over the next 7 days.  If patient returns call to provider office, please advise to call [place]* at [phone].*  [place] Management  Direct Dial: [phone]

## 2021-02-02 NOTE — Telephone Encounter (Signed)
Can you place CCM referral for pharmacy for him please? Associate with 2 or more chronic diagnoses

## 2021-02-03 NOTE — Chronic Care Management (AMB) (Signed)
  Chronic Care Management   Note  02/03/2021 Name: Cameron Foster. MRN: [id number] DOB: 1940/10/09  Cameron Foster. is a 80 y.o. year old male who is a primary care patient of Brita Romp, Dionne Bucy, MD. I reached out to [employer]. by phone today in response to a referral sent by [name]'s PCP.  Mr. Guinta was given information about Chronic Care Management services today including:  CCM service includes personalized support from designated clinical staff supervised by his physician, including individualized plan of care and coordination with other care providers 24/7 contact phone numbers for assistance for urgent and routine care needs. Service will only be billed when office clinical staff spend 20 minutes or more in a month to coordinate care. Only one practitioner may furnish and bill the service in a calendar month. The patient may stop CCM services at any time (effective at the end of the month) by phone call to the office staff. The patient is responsible for co-pay (up to 20% after annual deductible is met) if co-pay is required by the individual health plan.   Patient agreed to services and verbal consent obtained.   Follow up plan: Telephone appointment with care management team member scheduled for:02/23/21  [place] Management  Direct Dial: [phone]

## 2021-02-06 ENCOUNTER — Telehealth: Payer: Self-pay | Admitting: Family Medicine

## 2021-02-07 NOTE — Telephone Encounter (Signed)
Dc'd 12/09/20 Dr Harrell Gave End "error"

## 2021-02-09 ENCOUNTER — Other Ambulatory Visit: Payer: Self-pay | Admitting: Family Medicine

## 2021-02-09 NOTE — Telephone Encounter (Signed)
Patient would like a follow up call regarding why lisinopril (ZESTRIL) 40 MG tablet was denied. Patient states he is out of medication and would like medication called in today.   [id number] [address] [place]  [phone] Fax:  [phone]

## 2021-02-10 ENCOUNTER — Other Ambulatory Visit: Payer: Self-pay | Admitting: Family Medicine

## 2021-02-10 ENCOUNTER — Telehealth: Payer: Self-pay

## 2021-02-10 NOTE — Telephone Encounter (Signed)
Please review for Dr. Jacinto Reap

## 2021-02-10 NOTE — Telephone Encounter (Signed)
Copied from [place] [phone]. Topic: General - Other >> Feb 10, 2021 12:05 PM Leward Quan A wrote: Reason for CRM: Patient called in to inform Dr B that the wrong Rx was sent to the pharmacy they filled lisinopril (ZESTRIL) 10 MG tablet  and per patient it should be lisinopril (ZESTRIL) 40 MG tablet asking for Rx to be sent to the Pharmacy today since he is all out. Please advise and call Ph# [phone]

## 2021-02-10 NOTE — Telephone Encounter (Signed)
Reviewing over medical record patient was recently seen in office by you on 01/26/21 for Hypertension on outpatient medications prior to visit it is listed lisinopril 25m. On 02/09/21 Dr. BBrita Rompsent in Lisionopril 190mand reviewing medication list I see that Lisinopril 4092mas last filled 09/02/20. Please review. KW

## 2021-02-11 ENCOUNTER — Other Ambulatory Visit: Payer: Self-pay | Admitting: Family Medicine

## 2021-02-11 DIAGNOSIS — I10 Essential (primary) hypertension: Secondary | ICD-10-CM

## 2021-02-11 MED ORDER — LISINOPRIL 40 MG PO TABS: 40.0000 mg | ORAL_TABLET | Freq: Every day | ORAL | 0 refills | Status: DC

## 2021-02-11 NOTE — Telephone Encounter (Signed)
Please review  medication for 52m was last filled 5/18 if patient did have 430mfilled that day he would have ran out of 4026mosage as of 02/02/21 or shortly after.  KW

## 2021-02-14 ENCOUNTER — Other Ambulatory Visit: Payer: Self-pay | Admitting: Family Medicine

## 2021-02-14 NOTE — Telephone Encounter (Signed)
dc'd 11/23/20 Dr Brita Romp

## 2021-02-19 ENCOUNTER — Telehealth: Payer: Self-pay

## 2021-02-19 NOTE — Progress Notes (Signed)
Chronic Care Management Pharmacy Assistant   Name: Cameron Foster.  MRN: [id number] DOB: 21-Oct-1940  Chart review for clinical pharmacist on 02/23/2021 at 9:30 am.  Conditions to be addressed/monitored: HTN, HLD, CKD Stage 3, Depression, Osteoarthritis, and CVA, PSVT, Chronic venous insufficiency, recurrent sinuitis,Degeneration of lumbar intervertebral disc, Synovitis and tenosynovitis, low back pain, Anemia, Dizziness, Chronic cough  Primary concerns for visit include: Patient states he is unsure what medications he should be taking so he is not taking any of his medications.Patient states he has all these different providers prescribing him medications or changing his medications and he is confused/frustrated.    Recent office visits:  01/30/2021 [name] MD (PCP) AMB referral to community care coordination 01/26/2021 [name] (PCP office) start guaiFENesin-dextromethorphan 100-10 mg/75m prn, start Prednisone 20 mg daily, change Meclizine 12.5 mg 3 time daily, start Zyrtec/Claritin by mouth, daily,Ambulatory referral to ENT 11/23/2020 Dr. BBrita RompMD (PCP)Taper Zoloft to 50 mg daily for 2-4 weeks, then 568mevery other day for 2-4 weeks, then stop, Return in about 6 months  09/17/2020 DeSimona Huhhrismon PA-C (PCP Office) No medication changes noted  Recent consult visits:  01/13/2021 Dr. LaFulton MoleD (Internal Medicine) No medication changes noted 12/09/2020 Dr. EnSaunders RevelD (Cardiology) No medication changes noted, Return to clinic in 1 year 12/07/2020 Dr. GrBelenda CruiseD (Vascular Surgery) No Medication Changes noted,  Return in about 6 months  09/10/2020 Dr. EnSaunders RevelD (Cardiology) No medication changes noted  09/09/2020 Dr. StBernardo HeaterD (Urology) start Sildenafil Citrate 20 mg daily 08/28/2020 FaEulogio DitchP (Vascular surgery) No medication changes noted  Hospital visits:  Medication Reconciliation was completed by comparing discharge summary, patient's EMR and Pharmacy list, and  upon discussion with patient.  Admitted to the hospital on 01/13/2021 due to Melena. Discharge date was 01/14/2021. [facility] None  Medication Changes at Hospital Discharge: -Changed none  Medications Discontinued at Hospital Discharge: -Stopped meloxicam ,stop omeprazole  Medications that remain the same after Hospital Discharge:??  -All other medications will remain the same.    Admitted to the hospital on 11/25/2020 due to Viral URI with Cough. Discharge date was 11/25/2020. [facility] -started albuterol 108 MCG/ACT inhaler  -started azelastine 0.1 % nasal spray  Medication Changes at Hospital Discharge: -Changed none  Medications Discontinued at Hospital Discharge: -Stopped none  Medications that remain the same after Hospital Discharge:??  -All other medications will remain the same.     Patient states he is unsure what this referral is about, and what a clinical pharmacist does.  Inform patient AlDaron Offers a clinical pharmacist who works closely with your PCP. Dr. BaBrita Rompanted you to speak with him to review each of your medications.The purpose is to make sure you are on the best medications possible.The clinical pharmacist wants to make sure your medications do not interact with each other, and we are doing everything we can to make sure your medicines are keeping you as healthy as possible.We also can help if you have any concerns with affording any of your medications or having any transportation issue.  Patient verbalized understanding,and appreciation.  Have you seen any other providers since your last visit?   Patient denies seeing any other providers.  Any changes in your medications or health?   Patient denies any changes with his medications.  Any side  effects  from any medications?   Patient denies any side effects from his medications.  Do you have an symptoms or problems not managed by your medications?   Patient denies any symptoms or problems.  Any concerns about your health right now?   Patient states he is unsure what medications he should be taking so he is not taking any of his medications.Patient states he has all these different providers prescribing him medications or changing his medications and he is confused/frustrated.    Has your provider asked that you check blood pressure, blood sugar, or follow special diet at home?   Patient reports he checks his blood pressure occasionally.  Do you get any type of exercise on a regular basis?   Patient states he does not exercise.  Can you think of a goal you would like to reach for your health?   Patient will inform clinical pharmacist.  Do you have any problems getting your medications?   Patient denies any problems getting his medications.  Is there anything that you would like to discuss during the appointment?    Patient states he is unsure what medications he should be taking so he is not taking any of his medications.Patient states he has all these different providers prescribing him medications or changing his medications and he is confused/frustrated.    Please bring medications and supplements to appointment Patient is aware to have his medication and supplements during his visit. Medications: Outpatient Encounter Medications as of 02/19/2021  Medication Sig   amLODipine (NORVASC) 10 MG tablet Take 10 mg by mouth daily.   aspirin 81 MG EC tablet Take 81 mg by mouth daily.    atorvastatin (LIPITOR) 40 MG tablet TAKE 1 TABLET BY MOUTH EVERY DAY   fluticasone (FLONASE) 50 MCG/ACT nasal spray Place 1 spray into both nostrils daily for 14 days.   furosemide (LASIX) 20 MG tablet TAKE 1 TABLET BY MOUTH EVERY DAY   guaiFENesin-dextromethorphan (ROBITUSSIN DM) 100-10 MG/5ML  syrup Take 5 mLs by mouth every 4 (four) hours as needed for cough.   hydrocortisone 2.5 % cream APPLY TO AFFECTED AREA TWICE A DAY AS NEEDED FOR RASH   lisinopril (ZESTRIL) 40 MG tablet Take 1 tablet (40 mg total) by mouth daily.   meclizine (ANTIVERT) 12.5 MG tablet Take 1 tablet (12.5 mg total) by mouth 3 (three) times daily as needed for dizziness.   metoprolol succinate (TOPROL-XL) 50 MG 24 hr tablet TAKE 1 TABLET BY MOUTH DAILY. TAKE WITH OR IMMEDIATELY FOLLOWING A MEAL.   Multiple Vitamin (MULTIVITAMIN WITH MINERALS) TABS tablet Take 1 tablet by mouth daily.   pantoprazole (PROTONIX) 40 MG tablet Take 1 tablet (40 mg total) by mouth 2 (two) times daily. To heal ulcers.   predniSONE (DELTASONE) 20 MG tablet Take 1 tablet (20 mg total) by mouth daily with breakfast.   sertraline (ZOLOFT) 50 MG tablet Take 2 tablets (100 mg total) by mouth daily.   No facility-administered encounter medications on file as of 02/19/2021.    Care Gaps: Shingrix Vaccine Pneumonia Vaccine COVID-19 Vaccine Star Rating Drugs: Lisinopril 40 mg [facility] Atorvastatin 40 mg last filled 12/30/2020 [facility]. Medication fill Gaps: None  Jyl Heinz [phone]

## 2021-02-23 ENCOUNTER — Ambulatory Visit (INDEPENDENT_AMBULATORY_CARE_PROVIDER_SITE_OTHER): Payer: Medicare Other | Admitting: Dermatology

## 2021-02-23 ENCOUNTER — Ambulatory Visit (INDEPENDENT_AMBULATORY_CARE_PROVIDER_SITE_OTHER): Payer: Medicare Other

## 2021-02-23 ENCOUNTER — Other Ambulatory Visit: Payer: Self-pay

## 2021-02-23 VITALS — BP 133/83

## 2021-02-23 DIAGNOSIS — Z1283 Encounter for screening for malignant neoplasm of skin: Secondary | ICD-10-CM

## 2021-02-23 DIAGNOSIS — I781 Nevus, non-neoplastic: Secondary | ICD-10-CM | POA: Diagnosis not present

## 2021-02-23 DIAGNOSIS — L570 Actinic keratosis: Secondary | ICD-10-CM

## 2021-02-23 DIAGNOSIS — L720 Epidermal cyst: Secondary | ICD-10-CM

## 2021-02-23 DIAGNOSIS — L853 Xerosis cutis: Secondary | ICD-10-CM

## 2021-02-23 DIAGNOSIS — L814 Other melanin hyperpigmentation: Secondary | ICD-10-CM

## 2021-02-23 DIAGNOSIS — L578 Other skin changes due to chronic exposure to nonionizing radiation: Secondary | ICD-10-CM

## 2021-02-23 DIAGNOSIS — Z85828 Personal history of other malignant neoplasm of skin: Secondary | ICD-10-CM

## 2021-02-23 DIAGNOSIS — N1831 Chronic kidney disease, stage 3a: Secondary | ICD-10-CM

## 2021-02-23 DIAGNOSIS — Z8673 Personal history of transient ischemic attack (TIA), and cerebral infarction without residual deficits: Secondary | ICD-10-CM

## 2021-02-23 DIAGNOSIS — D1801 Hemangioma of skin and subcutaneous tissue: Secondary | ICD-10-CM

## 2021-02-23 DIAGNOSIS — Z872 Personal history of diseases of the skin and subcutaneous tissue: Secondary | ICD-10-CM

## 2021-02-23 DIAGNOSIS — L820 Inflamed seborrheic keratosis: Secondary | ICD-10-CM

## 2021-02-23 DIAGNOSIS — F320 Major depressive disorder, single episode, mild: Secondary | ICD-10-CM

## 2021-02-23 DIAGNOSIS — I10 Essential (primary) hypertension: Secondary | ICD-10-CM

## 2021-02-23 DIAGNOSIS — L821 Other seborrheic keratosis: Secondary | ICD-10-CM

## 2021-02-23 DIAGNOSIS — E785 Hyperlipidemia, unspecified: Secondary | ICD-10-CM

## 2021-02-23 DIAGNOSIS — I480 Paroxysmal atrial fibrillation: Secondary | ICD-10-CM

## 2021-02-23 NOTE — Progress Notes (Signed)
Chronic Care Management Pharmacy Note  02/25/2021 Name:  Cameron Foster. MRN:  [id number] DOB:  August 03, 1940  Summary: Patient presents for initial CCM Consult. He is doing well overall, but reports his primary concerns are his energy and his leg. He reports frustrations with an inability to be seen by his PCP at the clinic.   He also has multiple medication discrepancies.    -Only taking amlodipine 5 mg (using an old supply)  -Only taking pantoprazole 40 mg daily (misunderstood instructions).   Patient appears to have a large supply of many medications as home, states he has a "cabinet full" at home. Unfortunately patient was not interested in using a pillbox, [facility], or other adherence strategies at this time.   Recommendations/Changes made from today's visit: -Query appropriate use of metoprolol in this patient given only one instance of A-Fib > 10 years ago. Could consider discontinuation to see of energy and fatigue symptoms improve.  -Continue current medications   Plan: CPP follow-up 3 months   Subjective: Cameron Foster. is an 80 y.o. year old male who is a primary patient of Bacigalupo, Dionne Bucy, MD.  The CCM team was consulted for assistance with disease management and care coordination needs.    Engaged with patient by telephone for initial visit in response to provider referral for pharmacy case management and/or care coordination services.   Consent to Services:  The patient was given the following information about Chronic Care Management services today, agreed to services, and gave verbal consent: 1. CCM service includes personalized support from designated clinical staff supervised by the primary care provider, including individualized plan of care and coordination with other care providers 2. 24/7 contact phone numbers for assistance for urgent and routine care needs. 3. Service will only be billed when office clinical staff spend 20 minutes or more in a  month to coordinate care. 4. Only one practitioner may furnish and bill the service in a calendar month. 5.The patient may stop CCM services at any time (effective at the end of the month) by phone call to the office staff. 6. The patient will be responsible for cost sharing (co-pay) of up to 20% of the service fee (after annual deductible is met). Patient agreed to services and consent obtained.  Patient Care Team: [name], MD as PCP - General (Family Medicine) End, Harrell Gave, MD as PCP - Cardiology (Cardiology) End, Harrell Gave, MD as Consulting Physician (Cardiology) [name], MD (Orthopedic Surgery) Schnier, Dolores Lory, MD (Vascular Surgery) [name], MD (Dermatology) [name], MD as Consulting Physician (Ophthalmology) [name], [facility] (Pharmacist)  Recent office visits: 01/30/2021 [name] MD (PCP) AMB referral to community care coordination 01/26/2021 [name] (PCP office) start guaiFENesin-dextromethorphan 100-10 mg/21m prn, start Prednisone 20 mg daily, change Meclizine 12.5 mg 3 time daily, start Zyrtec/Claritin by mouth, daily,Ambulatory referral to ENT 11/23/2020 Dr. BBrita RompMD (PCP)Taper Zoloft to 50 mg daily for 2-4 weeks, then 531mevery other day for 2-4 weeks, then stop, Return in about 6 months  09/17/2020 DeSimona Huhhrismon PA-C (PCP Office) No medication changes noted  Recent consult visits: 01/13/2021 Dr. LaFulton MoleD (Internal Medicine) No medication changes noted 12/09/2020 Dr. EnSaunders RevelD (Cardiology) No medication changes noted, Return to clinic in 1 year 12/07/2020 Dr. GrBelenda CruiseD (Vascular Surgery) No Medication Changes noted,  Return in about 6 months  09/10/2020 Dr. EnSaunders RevelD (Cardiology) No medication changes noted  09/09/2020 Dr. StBernardo HeaterD (Urology) start Sildenafil Citrate 20 mg  daily 08/28/2020 Eulogio Ditch NP (Vascular surgery) No medication changes noted  Hospital visits: Admitted to the hospital on 01/13/2021 due to  Melena. Discharge date was 01/14/2021. Discharged from [place]?Medications Started at [facility] Discharge:?? -started None   Medication Changes at Hospital Discharge: -Changed none   Medications Discontinued at Hospital Discharge: -Stopped meloxicam ,stop omeprazole   Medications that remain the same after Hospital Discharge:??  -All other medications will remain the same.     Admitted to the hospital on 11/25/2020 due to Viral URI with Cough. Discharge date was 11/25/2020. Discharged from [place]?Medications Started at [facility] Discharge:?? -started albuterol 108 MCG/ACT inhaler  -started azelastine 0.1 % nasal spray   Medication Changes at Hospital Discharge: -Changed none   Medications Discontinued at Hospital Discharge: -Stopped none   Medications that remain the same after Hospital Discharge:??  -All other medications will remain the same.   Objective:  Lab Results  Component Value Date   CREATININE 1.27 (H) 01/13/2021   BUN 53 (H) 01/13/2021   GFR 57.85 (L) 02/21/2018   GFRNONAA 57 (L) 01/13/2021   GFRAA 62 08/15/2019   NA 138 01/13/2021   K 4.1 01/13/2021   CALCIUM 9.3 01/13/2021   CO2 25 01/13/2021   GLUCOSE 115 (H) 01/13/2021    Lab Results  Component Value Date/Time   HGBA1C 5.4 09/24/2020 03:12 PM   HGBA1C 5.6 06/09/2019 05:27 AM   HGBA1C 5.3 10/11/2018 12:00 AM   GFR 57.85 (L) 02/21/2018 11:56 AM   GFR 61.28 04/19/2017 03:50 PM    Last diabetic Eye exam: No results found for: HMDIABEYEEXA  Last diabetic Foot exam: No results found for: HMDIABFOOTEX   Lab Results  Component Value Date   CHOL 156 08/15/2019   HDL 46 08/15/2019   LDLCALC 85 08/15/2019   TRIG 144 08/15/2019   CHOLHDL 4.4 06/09/2019    Hepatic Function Latest Ref Rng & Units 01/13/2021 09/24/2020 06/08/2019  Total Protein 6.5 - 8.1 g/dL 6.8 6.9 7.2  Albumin 3.5 - 5.0 g/dL 3.6 4.1 3.9  AST 15 - 41 U/L 19 18 27    ALT 0 - 44 U/L 15 12 15   Alk Phosphatase 38 - 126 U/L 60 88 75  Total Bilirubin 0.3 - 1.2 mg/dL 0.7 0.5 0.8    Lab Results  Component Value Date/Time   TSH 5.605 (H) 10/20/2018 09:46 PM   TSH 2.49 10/11/2018 12:00 AM   TSH 1.88 01/20/2015 11:22 AM   FREET4 0.66 10/21/2018 01:29 PM    CBC Latest Ref Rng & Units 01/14/2021 01/14/2021 01/13/2021  WBC 4.0 - 10.5 K/uL - - 7.4  Hemoglobin 13.0 - 17.0 g/dL 8.5(L) 7.9(L) 9.8(L)  Hematocrit 39.0 - 52.0 % - - 29.8(L)  Platelets 150 - 400 K/uL - - 234    No results found for: VD25OH  Clinical ASCVD: Yes  The ASCVD Risk score (Arnett DK, et al., 2019) failed to calculate for the following reasons:   The 2019 ASCVD risk score is only valid for ages 26 to 55   The patient has a prior MI or stroke diagnosis    Depression screen [facility] 2/9 09/17/2020 05/18/2020 01/14/2020  Decreased Interest 0 0 0  Down, Depressed, Hopeless 0 0 0  PHQ - 2 Score 0 0 0  Altered sleeping 0 - 0  Tired, decreased energy 3 - 0  Change in appetite 0 - 0  Feeling bad or failure  about yourself  0 - 0  Trouble concentrating 0 - 0  Moving slowly or fidgety/restless 0 - 0  Suicidal thoughts 0 - 0  PHQ-9 Score 3 - 0  Difficult doing work/chores Not difficult at all - Not difficult at all  Some recent data might be hidden    Social History   Tobacco Use  Smoking Status Former   Packs/day: 1.00   Years: 27.00   Pack years: 27.00   Types: Cigarettes   Quit date: 04/18/1988   Years since quitting: 32.8  Smokeless Tobacco Current   Types: Chew   BP Readings from Last 3 Encounters:  02/23/21 133/83  01/26/21 (!) 144/91  01/14/21 (!) 144/78   Pulse Readings from Last 3 Encounters:  01/26/21 73  01/14/21 71  12/09/20 67   Wt Readings from Last 3 Encounters:  01/26/21 211 lb 11.2 oz (96 kg)  01/14/21 200 lb (90.7 kg)  12/09/20 214 lb 4 oz (97.2 kg)   BMI Readings from Last 3 Encounters:  01/26/21 32.19 kg/m  01/14/21 30.41 kg/m  12/09/20 32.58 kg/m     Assessment/Interventions: Review of patient past medical history, allergies, medications, health status, including review of consultants reports, laboratory and other test data, was performed as part of comprehensive evaluation and provision of chronic care management services.   SDOH:  (Social Determinants of Health) assessments and interventions performed: Yes SDOH Interventions    Flowsheet Row Most Recent Value  SDOH Interventions   Financial Strain Interventions Intervention Not Indicated      SDOH Screenings   Alcohol Screen: Low Risk    Last Alcohol Screening Score (AUDIT): 4  Depression (PHQ2-9): Low Risk    PHQ-2 Score: 3  Financial Resource Strain: Low Risk    Difficulty of Paying Living Expenses: Not hard at all  Food Insecurity: No Food Insecurity   Worried About Charity fundraiser in the Last Year: Never true   Ran Out of Food in the Last Year: Never true  Housing: Low Risk    Last Housing Risk Score: 0  Physical Activity: Inactive   Days of Exercise per Week: 0 days   Minutes of Exercise per Session: 0 min  Social Connections: Engineer, building services of Communication with Friends and Family: More than three times a week   Frequency of Social Gatherings with Friends and Family: Twice a week   Attends Religious Services: More than 4 times per year   Active Member of [employer] or Organizations: Yes   Attends Music therapist: More than 4 times per year   Marital Status: Married  Stress: No Stress Concern Present   Feeling of Stress : Not at all  Tobacco Use: High Risk   Smoking Tobacco Use: Former   Smokeless Tobacco Use: Current   Passive Exposure: Not on Pensions consultant Needs: No Transportation Needs   Lack of Transportation (Medical): No   Lack of Transportation (Non-Medical): No    CCM Care Plan  Allergies  Allergen Reactions   Formaldehyde Rash    Medications Reviewed Today     Reviewed by Joyce Gross, CMA  (Certified Medical Assistant) on 02/23/21 at 75  Med List Status: <None>   Medication Order Taking? Sig Documenting Provider Last Dose Status Informant  amLODipine (NORVASC) 10 MG tablet [id number] No Take 5 mg by mouth daily. [provider] Taking Active Self  aspirin 81 MG EC tablet [id number] No Take 81 mg by mouth daily.  [provider] Taking Active Self  atorvastatin (LIPITOR) 40 MG tablet [id number] No TAKE 1 TABLET BY MOUTH EVERY DAY Bacigalupo, Dionne Bucy, MD Taking Active Self  fluticasone (FLONASE) 50 MCG/ACT nasal spray [id number] No Place 1 spray into both nostrils daily for 14 days. Enzo Bi, MD Taking Active   furosemide (LASIX) 20 MG tablet [id number] No TAKE 1 TABLET BY MOUTH EVERY DAY End, Christopher, MD Taking Active Self  hydrocortisone 2.5 % cream [id number] No APPLY TO AFFECTED AREA TWICE A DAY AS NEEDED FOR RASH Bacigalupo, Dionne Bucy, MD Taking Active Self  lisinopril (ZESTRIL) 40 MG tablet [id number] No Take 1 tablet (40 mg total) by mouth daily. Gwyneth Sprout, FNP Taking Active   meclizine (ANTIVERT) 12.5 MG tablet [id number] No Take 1 tablet (12.5 mg total) by mouth 3 (three) times daily as needed for dizziness.  Patient not taking: Reported on 02/23/2021   Gwyneth Sprout, FNP Not Taking Active   metoprolol succinate (TOPROL-XL) 50 MG 24 hr tablet [id number] No TAKE 1 TABLET BY MOUTH DAILY. TAKE WITH OR IMMEDIATELY FOLLOWING A MEAL. [name], MD Taking Active Self  Multiple Vitamin (MULTIVITAMIN WITH MINERALS) TABS tablet [id number] No Take 1 tablet by mouth daily.  Patient taking differently: Take 1 tablet by mouth daily. Equate Complete   Vaughan Basta, MD Taking Active Self  pantoprazole (PROTONIX) 40 MG tablet [id number] No Take 1 tablet (40 mg total) by mouth 2 (two) times daily. To heal ulcers.  Patient taking differently: Take 40 mg by mouth daily. To heal ulcers.   Enzo Bi, MD Taking Active   sertraline (ZOLOFT) 50 MG tablet  [id number] No Take 2 tablets (100 mg total) by mouth daily. Enzo Bi, MD Taking Active   vitamin B-12 (CYANOCOBALAMIN) 1000 MCG tablet [id number] No Take 1,000 mcg by mouth daily. [provider] Taking Active             Patient Active Problem List   Diagnosis Date Noted   Chronic pansinusitis 01/26/2021   Dizziness 01/26/2021   Chronic cough 01/26/2021   Need for influenza vaccination 01/26/2021   Hospital discharge follow-up 01/26/2021   Melena 01/13/2021   Acute blood loss anemia 01/13/2021   Alcohol use disorder, moderate, dependence ([place]) 01/13/2021   Stage 3a chronic kidney disease ([place]) 11/23/2020   Anemia 11/23/2020   Chronic pain of right ankle 05/21/2020   Encounter for attention to colostomy ([place]) 05/21/2020   Achilles tendinitis 04/30/2020   Synovitis and tenosynovitis 04/30/2020   Lymphedema 03/03/2020   Chronic venous insufficiency 03/03/2020   Fatigue 02/14/2020   Leg edema 02/14/2020   Foraminal stenosis of lumbar region 11/08/2019   Chronic bilateral low back pain without sciatica 10/18/2019   Foraminal stenosis of cervical region 10/18/2019   PSVT (paroxysmal supraventricular tachycardia) ([place]) 08/01/2019   Lacunar infarction ([place]) 07/10/2019   History of lacunar cerebrovascular accident (CVA) 07/01/2019   Cerebrovascular accident (CVA) ([place]) 06/08/2019   Paroxysmal atrial fibrillation ([place]) 11/07/2018   TIA (transient ischemic attack) 10/20/2018   History of subarachnoid hemorrhage 10/16/2018   Recurrent sinusitis 10/16/2018   Low libido 08/30/2018   Obesity 05/14/2018   Hyperlipidemia LDL goal <70 05/14/2018   Degeneration of lumbar intervertebral disc 03/22/2018   Osteoarthritis of hip 03/22/2018   Trochanteric bursitis of right hip 03/22/2018   History of colostomy reversal 03/21/2017   Status post partial colectomy 01/03/2017   Depression, major, single episode, mild ([place]) 01/03/2017   Vasovagal syncope 01/27/2016   History of skin  cancer  10/28/2015   Osteoarthritis of right knee 10/01/2015   Essential hypertension 08/25/2015   Dyspnea on exertion 08/12/2015   Erectile dysfunction following radiation therapy 08/04/2015   Constipation 08/02/2015   History of prostate cancer 01/20/2015   Swelling of right lower extremity 01/20/2015    Immunization History  Administered Date(s) Administered   Fluad Quad(high Dose 65+) 01/14/2020, 01/26/2021   Influenza, High Dose Seasonal PF 01/26/2017, 02/01/2018, 01/31/2019   Influenza,inj,Quad PF,6+ Mos 02/08/2016   Influenza,inj,quad, With Preservative 12/18/2014   PFIZER Comirnaty(Gray Top)Covid-19 Tri-Sucrose Vaccine 08/24/2020   PFIZER(Purple Top)SARS-COV-2 Vaccination 05/31/2019, 06/25/2019, 01/22/2020   Pneumococcal Conjugate-13 05/14/2018   Pneumococcal Polysaccharide-23 12/18/2014   Pneumococcal-Unspecified 12/18/2014   Tdap 01/14/2020    Conditions to be addressed/monitored:  Hypertension, Hyperlipidemia, Atrial Fibrillation, Chronic Kidney Disease, Depression, Osteoarthritis, and History of TIA, CVA   Care Plan : General Pharmacy (Adult)  Updates made by [name], RPH since 02/25/2021 12:00 AM     Problem: Hypertension, Hyperlipidemia, Atrial Fibrillation, Chronic Kidney Disease, Depression, Osteoarthritis, and History of TIA, CVA   Priority: High     Long-Range Goal: Patient-Specific Goal   Start Date: 02/25/2021  Expected End Date: 02/25/2022  This Visit's Progress: On track  Priority: High  Note:   Current Barriers:  Unable to self administer medications as prescribed  Pharmacist Clinical Goal(s):  Patient will maintain control of blood pressure as evidenced by BP less than 140/90  through collaboration with PharmD and provider.   Interventions: 1:1 collaboration with [name], MD regarding development and update of comprehensive plan of care as evidenced by provider attestation and co-signature Inter-disciplinary care team  collaboration (see longitudinal plan of care) Comprehensive medication review performed; medication list updated in electronic medical record  Hypertension (BP goal <140/90) -Uncontrolled -Current treatment: Amlodipine 10 mg daily (AM; takes 5 mg tablet)  Furosemide 20 mg daily (AM)  Lisinopril 40 mg daily (AM)  Metoprolol XL 50 mg daily (AM)  -Medications previously tried: Atenolol, Bisoprolol, nebivolol  -Current home readings: 120/80, 133/83 -Current dietary habits: Eats what I want to eat. 1 cup of coffee. Ocassional sweet tea or regular soda. Eats out large meal once weekly. Does salt food.  -Current exercise habits: None -Denies hypotensive/hypertensive symptoms, but does not significant fatigue throughout the day.  -Query appropriate use of metoprolol in this patient given only one instance of A-Fib > 10 years ago. Could consider discontinuation to see of energy and fatigue symptoms improve.  -Recommended to continue current medication  Hyperlipidemia: (LDL goal < 70) -Uncontrolled -History of TIA, CVA  -Current treatment: Atorvastatin 40 mg daily (AM) -Medications previously tried: NA  -Recommended to continue current medication  Atrial Fibrillation (Goal: prevent stroke and major bleeding) -Controlled -CHADSVASC: 5 -Current treatment: Rate control: Metoprolol XL 50 mg daily  Anticoagulation: None due to history of subarachnoid hemorrhage -Medications previously tried: NA -Only one instance of A-fib documented >10 years ago -Recommended to continue current medication  Depression (Goal: Maintain symptom remission) -Controlled -Current treatment: Sertraline 50 mg 2 tablets daily  -Medications previously tried/failed: NA -PHQ9: 3 -Recommended to continue current medication  Chronic Kidney Disease Stage 3a  -All medications assessed for renal dosing and appropriateness in chronic kidney disease. -Recommended to continue current medication  Patient Goals/Self-Care  Activities Patient will:  - check blood pressure weekly, document, and provide at future appointments  Follow Up Plan: Telephone follow up appointment with care management team member scheduled for:  05/31/2020 at 11:00 AM      Medication  Assistance: None required.  Patient affirms current coverage meets needs.  Compliance/Adherence/Medication fill history: Care Gaps: Shingrix Vaccine Pneumonia Vaccine COVID-19 Vaccine  Star-Rating Drugs: Lisinopril 40 mg last filled 02/10/2021 [facility]. Atorvastatin 40 mg last filled 12/30/2020 [facility] day [facility] at [facility].  [facility]  [id number] [place] [place]Phone: [phone]: [phone] Uses pill box? Yes Pt endorses 100% compliance  We discussed: Current pharmacy is preferred with insurance plan and patient is satisfied with pharmacy services Patient decided to: Continue current medication management strategy  Care Plan and Follow Up Patient Decision:  Patient agrees to Care Plan and Follow-up.  Plan: Telephone follow up appointment with care management team member scheduled for:  05/31/2020 at 11:00 AM  AJunius Argyle PharmD, [phone]

## 2021-02-23 NOTE — Progress Notes (Signed)
Follow-Up Visit   Subjective  Cameron Ra. is a 80 y.o. male who presents for the following: Follow-up.  Patient here for 6 month follow-up and UBSE. He has a history of BCC/SCC/AK. He has a new growth on his right forearm and behind ear he would like checked, gets irritated. Also a growth by his left thumb.   The following portions of the chart were reviewed this encounter and updated as appropriate:       Review of Systems:  No other skin or systemic complaints except as noted in HPI or Assessment and Plan.  Objective  Well appearing patient in no apparent distress; mood and affect are within normal limits.  All skin waist up examined.  R flexor forearm x 1, L post auricular neck x 2, R flank x 1 (4) Erythematous keratotic or waxy stuck-on papule    L base of thenar palm 1.0 cm firm blue sq nodule  L lat eyebrow x 2, L upper temple x 3, R sideburn x 1 (6) Pink scaly macules.   back Blanching pink macules.    Assessment & Plan  Skin cancer screening performed today.  Actinic Damage - chronic, secondary to cumulative UV radiation exposure/sun exposure over time - diffuse scaly erythematous macules with underlying dyspigmentation - Recommend daily broad spectrum sunscreen SPF 30+ to sun-exposed areas, reapply every 2 hours as needed.  - Recommend staying in the shade or wearing long sleeves, sun glasses (UVA+UVB protection) and wide brim hats (4-inch brim around the entire circumference of the hat). - Call for new or changing lesions.  History of Basal Cell Carcinoma of the Skin - No evidence of recurrence today, multiple. See History.  - Recommend regular full body skin exams - Recommend daily broad spectrum sunscreen SPF 30+ to sun-exposed areas, reapply every 2 hours as needed.  - Call if any new or changing lesions are noted between office visits  History of Squamous Cell Carcinoma of the Skin - No evidence of recurrence today, multiple. See History. - No  lymphadenopathy - Recommend regular full body skin exams - Recommend daily broad spectrum sunscreen SPF 30+ to sun-exposed areas, reapply every 2 hours as needed.  - Call if any new or changing lesions are noted between office visits  Lentigines - Scattered tan macules - Due to sun exposure - Benign-appering, observe - Recommend daily broad spectrum sunscreen SPF 30+ to sun-exposed areas, reapply every 2 hours as needed. - Call for any changes  Seborrheic Keratoses - Stuck-on, waxy, tan-brown papules and/or plaques  - Benign-appearing - Discussed benign etiology and prognosis. - Observe - Call for any changes  Xerosis - diffuse xerotic patches - recommend gentle, hydrating skin care - gentle skin care handout given  Hemangiomas - Red papules - Discussed benign nature - Observe - Call for any changes   Inflamed seborrheic keratosis R flexor forearm x 1, L post auricular neck x 2, R flank x 1  Vs inflamed cyst (L post auricular neck)  Destruction of lesion - R flexor forearm x 1, L post auricular neck x 2, R flank x 1  Destruction method: cryotherapy   Informed consent: discussed and consent obtained   Lesion destroyed using liquid nitrogen: Yes   Region frozen until ice ball extended beyond lesion: Yes   Outcome: patient tolerated procedure well with no complications   Post-procedure details: wound care instructions given   Additional details:  Prior to procedure, discussed risks of blister formation, small wound, skin  dyspigmentation, or rare scar following cryotherapy. Recommend Vaseline ointment to treated areas while healing.   Epidermal cyst L base of thenar palm  Benign-appearing, observe.   Discussed referral to hand surgeon if bothersome or if changes.  AK (actinic keratosis) (6) L lat eyebrow x 2, L upper temple x 3, R sideburn x 1  Actinic keratoses are precancerous spots that appear secondary to cumulative UV radiation exposure/sun exposure over  time. They are chronic with expected duration over 1 year. A portion of actinic keratoses will progress to squamous cell carcinoma of the skin. It is not possible to reliably predict which spots will progress to skin cancer and so treatment is recommended to prevent development of skin cancer.  Recommend daily broad spectrum sunscreen SPF 30+ to sun-exposed areas, reapply every 2 hours as needed.  Recommend staying in the shade or wearing long sleeves, sun glasses (UVA+UVB protection) and wide brim hats (4-inch brim around the entire circumference of the hat). Call for new or changing lesions.  Destruction of lesion - L lat eyebrow x 2, L upper temple x 3, R sideburn x 1  Destruction method: cryotherapy   Informed consent: discussed and consent obtained   Lesion destroyed using liquid nitrogen: Yes   Region frozen until ice ball extended beyond lesion: Yes   Outcome: patient tolerated procedure well with no complications   Post-procedure details: wound care instructions given   Additional details:  Prior to procedure, discussed risks of blister formation, small wound, skin dyspigmentation, or rare scar following cryotherapy. Recommend Vaseline ointment to treated areas while healing.   Telangiectasias back  Benign, observe.    Return in about 6 months (around 08/23/2021) for AKs.  IJamesetta Orleans, CMA, am acting as scribe for [name], MD . Documentation: I have reviewed the above documentation for accuracy and completeness, and I agree with the above.  [name] MD

## 2021-02-23 NOTE — Patient Instructions (Signed)
Cryotherapy Aftercare  Wash gently with soap and water everyday.   Apply Vaseline and Band-Aid daily until healed.   Gentle Skin Care Guide  1. Bathe no more than once a day.  2. Avoid bathing in hot water  3. Use a mild soap like Dove, Vanicream, Cetaphil, CeraVe. Can use Lever 2000 or Cetaphil antibacterial soap  4. Use soap only where you need it. On most days, use it under your arms, between your legs, and on your feet. Let the water rinse other areas unless visibly dirty.  5. When you get out of the bath/shower, use a towel to gently blot your skin dry, don't rub it.  6. While your skin is still a little damp, apply a moisturizing cream such as Vanicream, CeraVe, Cetaphil, Eucerin, Sarna lotion or plain Vaseline Jelly. For hands apply Neutrogena [place] Hand Cream or Excipial Hand Cream.  7. Reapply moisturizer any time you start to itch or feel dry.  8. Sometimes using free and clear laundry detergents can be helpful. Fabric softener sheets should be avoided. Downy Free & Gentle liquid, or any liquid fabric softener that is free of dyes and perfumes, it acceptable to use  9. If your doctor has given you prescription creams you may apply moisturizers over them     If you have any questions or concerns for your doctor, please call our main line at [phone] and press option 4 to reach your doctor's medical assistant. If no one answers, please leave a voicemail as directed and we will return your call as soon as possible. Messages left after 4 pm will be answered the following business day.   You may also send [place] a message via [place]. We typically respond to MyChart messages within 1-2 business days.  For prescription refills, please ask your pharmacy to contact our office. Our fax number is [phone].  If you have an urgent issue when the clinic is closed that cannot wait until the next business day, you can page your doctor at the number below.    Please note that  while we do our best to be available for urgent issues outside of office hours, we are not available 24/7.   If you have an urgent issue and are unable to reach [place], you may choose to seek medical care at your doctor's office, retail clinic, urgent care center, or emergency room.  If you have a medical emergency, please immediately call 911 or go to the emergency department.  Pager Numbers  - Dr. Nehemiah Massed: [phone]  - Dr. Laurence Ferrari: [phone]  - Dr. Nicole Kindred: [phone]  In the event of inclement weather, please call our main line at [phone] for an update on the status of any delays or closures.  Dermatology Medication Tips: Please keep the boxes that topical medications come in in order to help keep track of the instructions about where and how to use these. Pharmacies typically print the medication instructions only on the boxes and not directly on the medication tubes.   If your medication is too expensive, please contact our office at [phone] option 4 or send [place] a message through [place].   We are unable to tell what your co-pay for medications will be in advance as this is different depending on your insurance coverage. However, we may be able to find a substitute medication at lower cost or fill out paperwork to get insurance to cover a needed medication.   If a prior authorization is required to get your medication  covered by your insurance company, please allow [place] 1-2 business days to complete this process.  Drug prices often vary depending on where the prescription is filled and some pharmacies may offer cheaper prices.  The website www.goodrx.com contains coupons for medications through different pharmacies. The prices here do not account for what the cost may be with help from insurance (it may be cheaper with your insurance), but the website can give you the price if you did not use any insurance.  - You can print the associated coupon and take it with your  prescription to the pharmacy.  - You may also stop by our office during regular business hours and pick up a GoodRx coupon card.  - If you need your prescription sent electronically to a different pharmacy, notify our office through [facility] or by phone at [phone] option 4.

## 2021-02-25 DIAGNOSIS — K254 Chronic or unspecified gastric ulcer with hemorrhage: Secondary | ICD-10-CM | POA: Diagnosis not present

## 2021-02-25 DIAGNOSIS — Z8546 Personal history of malignant neoplasm of prostate: Secondary | ICD-10-CM | POA: Diagnosis not present

## 2021-02-25 DIAGNOSIS — K229 Disease of esophagus, unspecified: Secondary | ICD-10-CM | POA: Insufficient documentation

## 2021-02-25 DIAGNOSIS — I10 Essential (primary) hypertension: Secondary | ICD-10-CM | POA: Diagnosis not present

## 2021-02-25 DIAGNOSIS — I480 Paroxysmal atrial fibrillation: Secondary | ICD-10-CM | POA: Diagnosis not present

## 2021-02-25 NOTE — Patient Instructions (Signed)
Visit Information It was great speaking with you today!  Please let me know if you have any questions about our visit.   Goals Addressed             This Visit's Progress    Manage My Medicine       Timeframe:  Long-Range Goal Priority:  High Start Date: 02/25/2021                            Expected End Date: 02/25/2022                      Follow Up within 90 days   - call for medicine refill 2 or 3 days before it runs out - keep a list of all the medicines I take; vitamins and herbals too - use a pillbox to sort medicine    Why is this important?   These steps will help you keep on track with your medicines.   Notes:         Patient Care Plan: General Pharmacy (Adult)     Problem Identified: Hypertension, Hyperlipidemia, Atrial Fibrillation, Chronic Kidney Disease, Depression, Osteoarthritis, and History of TIA, CVA   Priority: High     Long-Range Goal: Patient-Specific Goal   Start Date: 02/25/2021  Expected End Date: 02/25/2022  This Visit's Progress: On track  Priority: High  Note:   Current Barriers:  Unable to self administer medications as prescribed  Pharmacist Clinical Goal(s):  Patient will maintain control of blood pressure as evidenced by BP less than 140/90  through collaboration with PharmD and provider.   Interventions: 1:1 collaboration with [name], MD regarding development and update of comprehensive plan of care as evidenced by provider attestation and co-signature Inter-disciplinary care team collaboration (see longitudinal plan of care) Comprehensive medication review performed; medication list updated in electronic medical record  Hypertension (BP goal <140/90) -Uncontrolled -Current treatment: Amlodipine 10 mg daily (AM; takes 5 mg tablet)  Furosemide 20 mg daily (AM)  Lisinopril 40 mg daily (AM)  Metoprolol XL 50 mg daily (AM)  -Medications previously tried: Atenolol, Bisoprolol, nebivolol  -Current home readings:  120/80, 133/83 -Current dietary habits: Eats what I want to eat. 1 cup of coffee. Ocassional sweet tea or regular soda. Eats out large meal once weekly. Does salt food.  -Current exercise habits: None -Denies hypotensive/hypertensive symptoms, but does not significant fatigue throughout the day.  -Query appropriate use of metoprolol in this patient given only one instance of A-Fib > 10 years ago. Could consider discontinuation to see of energy and fatigue symptoms improve.  -Recommended to continue current medication  Hyperlipidemia: (LDL goal < 70) -Uncontrolled -History of TIA, CVA  -Current treatment: Atorvastatin 40 mg daily (AM) -Medications previously tried: NA  -Recommended to continue current medication  Atrial Fibrillation (Goal: prevent stroke and major bleeding) -Controlled -CHADSVASC: 5 -Current treatment: Rate control: Metoprolol XL 50 mg daily  Anticoagulation: None due to history of subarachnoid hemorrhage -Medications previously tried: NA -Only one instance of A-fib documented >10 years ago -Recommended to continue current medication  Depression (Goal: Maintain symptom remission) -Controlled -Current treatment: Sertraline 50 mg 2 tablets daily  -Medications previously tried/failed: NA -PHQ9: 3 -Recommended to continue current medication  Chronic Kidney Disease Stage 3a  -All medications assessed for renal dosing and appropriateness in chronic kidney disease. -Recommended to continue current medication  Patient Goals/Self-Care Activities Patient will:  - check blood pressure weekly,  document, and provide at future appointments  Follow Up Plan: Telephone follow up appointment with care management team member scheduled for:  05/31/2020 at 11:00 AM    Mr. Tedrick was given information about Chronic Care Management services today including:  CCM service includes personalized support from designated clinical staff supervised by his physician, including  individualized plan of care and coordination with other care providers 24/7 contact phone numbers for assistance for urgent and routine care needs. Standard insurance, coinsurance, copays and deductibles apply for chronic care management only during months in which we provide at least 20 minutes of these services. Most insurances cover these services at 100%, however patients may be responsible for any copay, coinsurance and/or deductible if applicable. This service may help you avoid the need for more expensive face-to-face services. Only one practitioner may furnish and bill the service in a calendar month. The patient may stop CCM services at any time (effective at the end of the month) by phone call to the office staff.  Patient agreed to services and verbal consent obtained.   Patient verbalizes understanding of instructions provided today and agrees to view in [place].   Junius Argyle, PharmD, Para [place], CPP  Clinical Pharmacist Practitioner  [facility] [phone]

## 2021-03-02 ENCOUNTER — Encounter: Payer: Self-pay | Admitting: Family Medicine

## 2021-03-04 ENCOUNTER — Other Ambulatory Visit: Payer: Self-pay | Admitting: Family Medicine

## 2021-03-04 DIAGNOSIS — R0982 Postnasal drip: Secondary | ICD-10-CM

## 2021-03-04 NOTE — Telephone Encounter (Signed)
Requested medications are due for refill today NO  Requested medications are on the active medication list NO  Last refill 11/19/20  Last visit 01/26/21  Future visit scheduled 05/24/21  Notes to clinic Zyrtec not on current med list, Zoloft has a different dose/signature. Please assess. Requested Prescriptions  Pending Prescriptions Disp Refills   sertraline (ZOLOFT) 100 MG tablet [Pharmacy Med Name: SERTRALINE HCL 100 MG TABLET] 90 tablet 1    Sig: TAKE 1 TABLET BY MOUTH EVERY DAY     Psychiatry:  Antidepressants - SSRI Passed - 03/04/2021 12:13 PM      Passed - Completed PHQ-2 or PHQ-9 in the last 360 days      Passed - Valid encounter within last 6 months    Recent Outpatient Visits           1 month ago Hospital discharge follow-up   [facility] Tally Joe T, FNP   3 months ago Depression, major, single episode, mild [facility])   [facility], Dionne Bucy, MD   5 months ago Fatigue, unspecified type   [place], Vickki Muff, PA-C   9 months ago Essential hypertension   [place], Dionne Bucy, MD   1 year ago Essential hypertension   [place], Dionne Bucy, MD       Future Appointments             In 6 months [name], MD [place]             cetirizine (ZYRTEC) 10 MG tablet [Pharmacy Med Name: CETIRIZINE HCL 10 MG TABLET] 90 tablet 3    Sig: TAKE 1 TABLET BY MOUTH EVERY DAY     Ear, Nose, and Throat:  Antihistamines Passed - 03/04/2021 12:13 PM      Passed - Valid encounter within last 12 months    Recent Outpatient Visits           1 month ago Hospital discharge follow-up   [facility] Tally Joe T, FNP   3 months ago Depression, major, single episode, mild [facility])   [facility], Dionne Bucy, MD   5 months ago Fatigue, unspecified type   [place], Vickki Muff, PA-C   9  months ago Essential hypertension   [facility] [place], Dionne Bucy, MD   1 year ago Essential hypertension   [place], Dionne Bucy, MD       Future Appointments             In 6 months [name], MD [place]             metoprolol succinate (TOPROL-XL) 50 MG 24 hr tablet [Pharmacy Med Name: METOPROLOL SUCC ER 50 MG TAB] 90 tablet 1    Sig: TAKE 1 TABLET BY MOUTH DAILY. TAKE WITH OR IMMEDIATELY FOLLOWING A MEAL.     Cardiovascular:  Beta Blockers Passed - 03/04/2021 12:13 PM      Passed - Last BP in normal range    BP Readings from Last 1 Encounters:  02/23/21 133/83          Passed - Last Heart Rate in normal range    Pulse Readings from Last 1 Encounters:  01/26/21 73          Passed - Valid encounter within last 6 months    Recent Outpatient Visits  1 month ago Hospital discharge follow-up   [facility] Tally Joe T, FNP   3 months ago Depression, major, single episode, mild [facility])   [facility] [place], Dionne Bucy, MD   5 months ago Fatigue, unspecified type   [place], Vickki Muff, PA-C   9 months ago Essential hypertension   [place], Dionne Bucy, MD   1 year ago Essential hypertension   [place], Dionne Bucy, MD       Future Appointments             In 6 months [name], MD [place]

## 2021-03-09 ENCOUNTER — Other Ambulatory Visit: Payer: Self-pay | Admitting: Family Medicine

## 2021-03-09 NOTE — Telephone Encounter (Signed)
Please send short supply unitil refill comes in/Medication Refill - Medication:sertraline (ZOLOFT) 50 MG tablet  Has the patient contacted their pharmacy? yes (Agent: If no, request that the patient contact the pharmacy for the refill. If patient does not wish to contact the pharmacy document the reason why and proceed with request.) (Agent: If yes, when and what did the pharmacy advise?)contact pcp  Preferred Pharmacy (with phone number or street name): [id number]  Phone:  [phone]:  [phone] the patient been seen for an appointment in the last year OR does the patient have an upcoming appointment? yes  Agent: Please be advised that RX refills may take up to 3 business days. We ask that you follow-up with your pharmacy.

## 2021-03-09 NOTE — Telephone Encounter (Signed)
Requested medication (s) are due for refill today: tapered medication   Requested medication (s) are on the active medication list: yes  Last refill:  01/14/21  Future visit scheduled: no   Notes to clinic:  tapered medication. Last ordered by Lolita Cram, MD. Class : no print. Requesting short supply until refill comes in      Requested Prescriptions  Pending Prescriptions Disp Refills   sertraline (ZOLOFT) 50 MG tablet      Sig: Take 2 tablets (100 mg total) by mouth daily.     Psychiatry:  Antidepressants - SSRI Passed - 03/09/2021 12:25 PM      Passed - Completed PHQ-2 or PHQ-9 in the last 360 days      Passed - Valid encounter within last 6 months    Recent Outpatient Visits           1 month ago Hospital discharge follow-up   [facility] Tally Joe T, FNP   3 months ago Depression, major, single episode, mild [facility])   [facility] [place], Dionne Bucy, MD   5 months ago Fatigue, unspecified type   [place], Vickki Muff, PA-C   9 months ago Essential hypertension   [place], Dionne Bucy, MD   1 year ago Essential hypertension   [place], Dionne Bucy, MD       Future Appointments             In 6 months [name], MD [place]

## 2021-03-12 ENCOUNTER — Other Ambulatory Visit: Payer: Self-pay | Admitting: Family Medicine

## 2021-03-12 NOTE — Telephone Encounter (Signed)
Pt called about refill request for Sertraline/ advised pt it was denied due to tapered medication / pt stated he knows nothing about being tapered off of this med and would like a refill/ he asked if [name] can please give him a call / please advise

## 2021-03-12 NOTE — Telephone Encounter (Signed)
Patient called back to inform Dr B that he remembers that he is being tapered off the Sertraline. So he was a bit confused at last call.Marland Kitchen

## 2021-03-17 DIAGNOSIS — F32A Depression, unspecified: Secondary | ICD-10-CM | POA: Diagnosis not present

## 2021-03-17 DIAGNOSIS — I129 Hypertensive chronic kidney disease with stage 1 through stage 4 chronic kidney disease, or unspecified chronic kidney disease: Secondary | ICD-10-CM | POA: Diagnosis not present

## 2021-03-17 DIAGNOSIS — E785 Hyperlipidemia, unspecified: Secondary | ICD-10-CM

## 2021-03-17 DIAGNOSIS — I4891 Unspecified atrial fibrillation: Secondary | ICD-10-CM | POA: Diagnosis not present

## 2021-03-17 DIAGNOSIS — N1831 Chronic kidney disease, stage 3a: Secondary | ICD-10-CM

## 2021-03-26 ENCOUNTER — Other Ambulatory Visit: Payer: Self-pay | Admitting: Family Medicine

## 2021-03-26 DIAGNOSIS — R0982 Postnasal drip: Secondary | ICD-10-CM

## 2021-04-10 ENCOUNTER — Other Ambulatory Visit: Payer: Self-pay | Admitting: Family Medicine

## 2021-04-10 ENCOUNTER — Other Ambulatory Visit: Payer: Self-pay | Admitting: Internal Medicine

## 2021-04-10 NOTE — Telephone Encounter (Signed)
dc'd 11/23/20 by Dr Brita Romp -  Requested Prescriptions  Refused Prescriptions Disp Refills   sertraline (ZOLOFT) 100 MG tablet [Pharmacy Med Name: SERTRALINE HCL 100 MG TABLET] 90 tablet 1    Sig: TAKE 1 TABLET BY MOUTH EVERY DAY     Psychiatry:  Antidepressants - SSRI Passed - 04/10/2021 11:32 AM      Passed - Completed PHQ-2 or PHQ-9 in the last 360 days      Passed - Valid encounter within last 6 months    Recent Outpatient Visits          2 months ago Hospital discharge follow-up   [facility] Tally Joe T, FNP   4 months ago Depression, major, single episode, mild [facility])   [facility] [place], Dionne Bucy, MD   6 months ago Fatigue, unspecified type   [place], Vickki Muff, PA-C   10 months ago Essential hypertension   [facility] [place], Dionne Bucy, MD   1 year ago Essential hypertension   [place], Dionne Bucy, MD      Future Appointments            In 5 months [name], MD [place]

## 2021-04-15 ENCOUNTER — Encounter: Admission: RE | Payer: Self-pay | Source: Ambulatory Visit

## 2021-04-15 ENCOUNTER — Ambulatory Visit: Admission: RE | Admit: 2021-04-15 | Payer: Medicare Other | Source: Ambulatory Visit | Admitting: Internal Medicine

## 2021-04-15 SURGERY — ESOPHAGOGASTRODUODENOSCOPY (EGD) WITH PROPOFOL
Anesthesia: General

## 2021-04-20 ENCOUNTER — Telehealth: Payer: Medicare Other | Admitting: Nurse Practitioner

## 2021-04-20 ENCOUNTER — Ambulatory Visit: Payer: Self-pay

## 2021-04-20 DIAGNOSIS — J0140 Acute pansinusitis, unspecified: Secondary | ICD-10-CM | POA: Diagnosis not present

## 2021-04-20 DIAGNOSIS — J40 Bronchitis, not specified as acute or chronic: Secondary | ICD-10-CM | POA: Diagnosis not present

## 2021-04-20 DIAGNOSIS — R051 Acute cough: Secondary | ICD-10-CM | POA: Diagnosis not present

## 2021-04-20 MED ORDER — BENZONATATE 100 MG PO CAPS
100.0000 mg | ORAL_CAPSULE | Freq: Three times a day (TID) | ORAL | 0 refills | Status: DC | PRN
Start: 2021-04-20 — End: 2021-05-31

## 2021-04-20 MED ORDER — PREDNISONE 10 MG PO TABS: 10.0000 mg | ORAL_TABLET | Freq: Two times a day (BID) | ORAL | 0 refills | Status: AC

## 2021-04-20 MED ORDER — DOXYCYCLINE HYCLATE 100 MG PO TABS: 100.0000 mg | ORAL_TABLET | Freq: Two times a day (BID) | ORAL | 0 refills | Status: AC

## 2021-04-20 NOTE — Telephone Encounter (Signed)
Chief Complaint: Cough Symptoms: Yellow mucus, sinus congestion Frequency: Started on 04/16/21 Pertinent Negatives: Patient denies SOB, fever, other symptoms Disposition: [] ED /[] Urgent Care (no appt availability in office) / [] Appointment(In office/virtual)/ [x]  [place] [facility]/ [] Home Care/ [] Refused Recommended Disposition /[] [place] [facility]/ []  Follow-up with PCP Additional Notes: No availability today in office as his request, refused appt tomorrow.    Summary: cough / rx request   The patient is currently experiencing a cough   The patient has had a cough since 12/330/22   The patient would like to be prescribed something for their discomfort   Please contact further when possible      Reason for Disposition  [1] Continuous (nonstop) coughing interferes with work or school AND [2] no improvement using cough treatment per Care Advice  Answer Assessment - Initial Assessment Questions 1. ONSET: "When did the cough begin?"      04/16/21 2. SEVERITY: "How bad is the cough today?"      Not going away 3. SPUTUM: "Describe the color of your sputum" (none, dry cough; clear, Vanmeter, yellow, green)     Yellow 4. HEMOPTYSIS: "Are you coughing up any blood?" If so ask: "How much?" (flecks, streaks, tablespoons, etc.)     No 5. DIFFICULTY BREATHING: "Are you having difficulty breathing?" If Yes, ask: "How bad is it?" (e.g., mild, moderate, severe)    - MILD: No SOB at rest, mild SOB with walking, speaks normally in sentences, can lie down, no retractions, pulse < 100.    - MODERATE: SOB at rest, SOB with minimal exertion and prefers to sit, cannot lie down flat, speaks in phrases, mild retractions, audible wheezing, pulse 100-120.    - SEVERE: Very SOB at rest, speaks in single words, struggling to breathe, sitting hunched forward, retractions, pulse > 120      No 6. FEVER: "Do you have a fever?" If Yes, ask: "What is your temperature, how was it measured, and when did it  start?"     N/A 7. CARDIAC HISTORY: "Do you have any history of heart disease?" (e.g., heart attack, congestive heart failure)      N/A 8. LUNG HISTORY: "Do you have any history of lung disease?"  (e.g., pulmonary embolus, asthma, emphysema)     N/A 9. PE RISK FACTORS: "Do you have a history of blood clots?" (or: recent major surgery, recent prolonged travel, bedridden)     N/A 10. OTHER SYMPTOMS: "Do you have any other symptoms?" (e.g., runny nose, wheezing, chest pain)       Sinus congestion 11. PREGNANCY: "Is there any chance you are pregnant?" "When was your last menstrual period?"       N/A 12. TRAVEL: "Have you traveled out of the country in the last month?" (e.g., travel history, exposures)       N/A  Protocols used: Cough - Acute Productive-A-AH

## 2021-04-20 NOTE — Progress Notes (Signed)
Virtual Visit Consent   Cameron Carnero., you are scheduled for a virtual visit with a [facility] provider today.     Just as with appointments in the office, your consent must be obtained to participate.  Your consent will be active for this visit and any virtual visit you may have with one of our providers in the next 365 days.     If you have a MyChart account, a copy of this consent can be sent to you electronically.  All virtual visits are billed to your insurance company just like a traditional visit in the office.    As this is a virtual visit, video technology does not allow for your provider to perform a traditional examination.  This may limit your provider's ability to fully assess your condition.  If your provider identifies any concerns that need to be evaluated in person or the need to arrange testing (such as labs, EKG, etc.), we will make arrangements to do so.     Although advances in technology are sophisticated, we cannot ensure that it will always work on either your end or our end.  If the connection with a video visit is poor, the visit may have to be switched to a telephone visit.  With either a video or telephone visit, we are not always able to ensure that we have a secure connection.     I need to obtain your verbal consent now.   Are you willing to proceed with your visit today?    Cameron Silverman. has provided verbal consent on 04/20/2021 for a virtual visit (video or telephone).   Cameron Schneiders, FNP   Date: 04/20/2021 2:02 PM   Virtual Visit via Video Note   I, Cameron Foster, connected with  Cameron Foster.  ([id number], August 10, 1940) on 04/20/21 at  2:00 PM EST by a video-enabled telemedicine application and verified that I am speaking with the correct person using two identifiers.  Location: Patient: Virtual Visit Location Patient: Home Provider: Virtual Visit Location Provider: Home Office   I discussed the limitations of evaluation and management by  telemedicine and the availability of in person appointments. The patient expressed understanding and agreed to proceed.    History of Present Illness: Cameron Foster. is a 81 y.o. who identifies as a male who was assigned male at birth, and is being seen today with complaint of a cough and congestion in his chest. He has a darker production of mucous from his chest. He has also been congested in his head.    The cough is worse when he is laying down. He has had bronchitis in the past. He has smoked cigarettes in the past. He has not needed inhalers in the past.   Denies a fever.  He has taken a home COVID test and was negative.    His wife had a similar URI last week also negative for COVID    Problems:  Patient Active Problem List   Diagnosis Date Noted   Chronic pansinusitis 01/26/2021   Dizziness 01/26/2021   Chronic cough 01/26/2021   Need for influenza vaccination 01/26/2021   Hospital discharge follow-up 01/26/2021   Melena 01/13/2021   Acute blood loss anemia 01/13/2021   Alcohol use disorder, moderate, dependence ([place]) 01/13/2021   Stage 3a chronic kidney disease ([place]) 11/23/2020   Anemia 11/23/2020   Chronic pain of right ankle 05/21/2020   Encounter for attention to colostomy ([place]) 05/21/2020   Achilles  tendinitis 04/30/2020   Synovitis and tenosynovitis 04/30/2020   Lymphedema 03/03/2020   Chronic venous insufficiency 03/03/2020   Fatigue 02/14/2020   Leg edema 02/14/2020   Foraminal stenosis of lumbar region 11/08/2019   Chronic bilateral low back pain without sciatica 10/18/2019   Foraminal stenosis of cervical region 10/18/2019   PSVT (paroxysmal supraventricular tachycardia) ([place]) 08/01/2019   Lacunar infarction ([place]) 07/10/2019   History of lacunar cerebrovascular accident (CVA) 07/01/2019   Cerebrovascular accident (CVA) ([place]) 06/08/2019   Paroxysmal atrial fibrillation ([place]) 11/07/2018   TIA (transient ischemic attack) 10/20/2018   History of  subarachnoid hemorrhage 10/16/2018   Recurrent sinusitis 10/16/2018   Low libido 08/30/2018   Obesity 05/14/2018   Hyperlipidemia LDL goal <70 05/14/2018   Degeneration of lumbar intervertebral disc 03/22/2018   Osteoarthritis of hip 03/22/2018   Trochanteric bursitis of right hip 03/22/2018   History of colostomy reversal 03/21/2017   Status post partial colectomy 01/03/2017   Depression, major, single episode, mild ([place]) 01/03/2017   Vasovagal syncope 01/27/2016   History of skin cancer 10/28/2015   Osteoarthritis of right knee 10/01/2015   Essential hypertension 08/25/2015   Dyspnea on exertion 08/12/2015   Erectile dysfunction following radiation therapy 08/04/2015   Constipation 08/02/2015   History of prostate cancer 01/20/2015   Swelling of right lower extremity 01/20/2015    Allergies:  Allergies  Allergen Reactions   Formaldehyde Rash   Medications:  Current Outpatient Medications:    amLODipine (NORVASC) 10 MG tablet, Take 5 mg by mouth daily., Disp: , Rfl:    aspirin 81 MG EC tablet, Take 81 mg by mouth daily. , Disp: , Rfl:    atorvastatin (LIPITOR) 40 MG tablet, TAKE 1 TABLET BY MOUTH EVERY DAY, Disp: 90 tablet, Rfl: 3   cetirizine (ZYRTEC) 10 MG tablet, TAKE 1 TABLET BY MOUTH EVERY DAY, Disp: 90 tablet, Rfl: 0   fluticasone (FLONASE) 50 MCG/ACT nasal spray, Place 1 spray into both nostrils daily for 14 days., Disp: 9.9 mL, Rfl: 0   furosemide (LASIX) 20 MG tablet, TAKE 1 TABLET BY MOUTH EVERY DAY, Disp: 90 tablet, Rfl: 1   hydrocortisone 2.5 % cream, APPLY TO AFFECTED AREA TWICE A DAY AS NEEDED FOR RASH, Disp: 28 g, Rfl: 12   lisinopril (ZESTRIL) 40 MG tablet, Take 1 tablet (40 mg total) by mouth daily., Disp: 30 tablet, Rfl: 0   meclizine (ANTIVERT) 12.5 MG tablet, Take 1 tablet (12.5 mg total) by mouth 3 (three) times daily as needed for dizziness. (Patient not taking: Reported on 02/23/2021), Disp: 30 tablet, Rfl: 0   metoprolol succinate (TOPROL-XL) 50 MG 24 hr  tablet, TAKE 1 TABLET BY MOUTH DAILY. TAKE WITH OR IMMEDIATELY FOLLOWING A MEAL., Disp: 90 tablet, Rfl: 1   Multiple Vitamin (MULTIVITAMIN WITH MINERALS) TABS tablet, Take 1 tablet by mouth daily. (Patient taking differently: Take 1 tablet by mouth daily. Equate Complete), Disp: 30 tablet, Rfl: 0   pantoprazole (PROTONIX) 40 MG tablet, Take 1 tablet (40 mg total) by mouth 2 (two) times daily. To heal ulcers. (Patient taking differently: Take 40 mg by mouth daily. To heal ulcers.), Disp: 60 tablet, Rfl: 2   sertraline (ZOLOFT) 50 MG tablet, Take 2 tablets (100 mg total) by mouth daily., Disp: , Rfl:    vitamin B-12 (CYANOCOBALAMIN) 1000 MCG tablet, Take 1,000 mcg by mouth daily., Disp: , Rfl:   Observations/Objective: Patient is well-developed, well-nourished in no acute distress.  Resting comfortably at home.  Head is normocephalic, atraumatic.  No  labored breathing.  Speech is clear and coherent with logical content.  Patient is alert and oriented at baseline.    Assessment and Plan: 1. Acute non-recurrent pansinusitis  - doxycycline (VIBRA-TABS) 100 MG tablet; Take 1 tablet (100 mg total) by mouth 2 (two) times daily for 7 days.  Dispense: 14 tablet; Refill: 0  2. Acute cough  - benzonatate (TESSALON) 100 MG capsule; Take 1 capsule (100 mg total) by mouth 3 (three) times daily as needed for cough.  Dispense: 30 capsule; Refill: 0 - predniSONE (DELTASONE) 10 MG tablet; Take 1 tablet (10 mg total) by mouth 2 (two) times daily with a meal for 5 days.  Dispense: 10 tablet; Refill: 0     Follow Up Instructions: I discussed the assessment and treatment plan with the patient. The patient was provided an opportunity to ask questions and all were answered. The patient agreed with the plan and demonstrated an understanding of the instructions.  A copy of instructions were sent to the patient via MyChart unless otherwise noted below.    The patient was advised to call back or seek an in-person  evaluation if the symptoms worsen or if the condition fails to improve as anticipated.  Time:  I spent 10 minutes with the patient via telehealth technology discussing the above problems/concerns.    Cameron Schneiders, FNP

## 2021-04-22 NOTE — Telephone Encounter (Signed)
Noted  

## 2021-04-27 ENCOUNTER — Encounter: Payer: Self-pay | Admitting: Internal Medicine

## 2021-04-28 ENCOUNTER — Encounter: Payer: Self-pay | Admitting: Internal Medicine

## 2021-04-28 ENCOUNTER — Ambulatory Visit: Payer: Medicare Other | Admitting: Anesthesiology

## 2021-04-28 ENCOUNTER — Ambulatory Visit
Admission: RE | Admit: 2021-04-28 | Discharge: 2021-04-28 | Disposition: A | Discharge status: Home / Self Care | Payer: Medicare Other | Source: Ambulatory Visit | Attending: Internal Medicine | Admitting: Internal Medicine

## 2021-04-28 ENCOUNTER — Encounter
Admission: RE | Disposition: A | Discharge status: Home / Self Care | Payer: Self-pay | Source: Ambulatory Visit | Attending: Internal Medicine

## 2021-04-28 DIAGNOSIS — Z79899 Other long term (current) drug therapy: Secondary | ICD-10-CM | POA: Insufficient documentation

## 2021-04-28 DIAGNOSIS — F32A Depression, unspecified: Secondary | ICD-10-CM | POA: Insufficient documentation

## 2021-04-28 DIAGNOSIS — N183 Chronic kidney disease, stage 3 unspecified: Secondary | ICD-10-CM | POA: Diagnosis not present

## 2021-04-28 DIAGNOSIS — K219 Gastro-esophageal reflux disease without esophagitis: Secondary | ICD-10-CM | POA: Insufficient documentation

## 2021-04-28 DIAGNOSIS — I2510 Atherosclerotic heart disease of native coronary artery without angina pectoris: Secondary | ICD-10-CM | POA: Diagnosis not present

## 2021-04-28 DIAGNOSIS — Z8673 Personal history of transient ischemic attack (TIA), and cerebral infarction without residual deficits: Secondary | ICD-10-CM | POA: Insufficient documentation

## 2021-04-28 DIAGNOSIS — Z87891 Personal history of nicotine dependence: Secondary | ICD-10-CM | POA: Diagnosis not present

## 2021-04-28 DIAGNOSIS — I452 Bifascicular block: Secondary | ICD-10-CM | POA: Diagnosis not present

## 2021-04-28 DIAGNOSIS — K259 Gastric ulcer, unspecified as acute or chronic, without hemorrhage or perforation: Secondary | ICD-10-CM | POA: Diagnosis not present

## 2021-04-28 DIAGNOSIS — K319 Disease of stomach and duodenum, unspecified: Secondary | ICD-10-CM | POA: Diagnosis not present

## 2021-04-28 DIAGNOSIS — K3189 Other diseases of stomach and duodenum: Secondary | ICD-10-CM | POA: Insufficient documentation

## 2021-04-28 DIAGNOSIS — C61 Malignant neoplasm of prostate: Secondary | ICD-10-CM | POA: Insufficient documentation

## 2021-04-28 DIAGNOSIS — I129 Hypertensive chronic kidney disease with stage 1 through stage 4 chronic kidney disease, or unspecified chronic kidney disease: Secondary | ICD-10-CM | POA: Diagnosis not present

## 2021-04-28 DIAGNOSIS — I739 Peripheral vascular disease, unspecified: Secondary | ICD-10-CM | POA: Insufficient documentation

## 2021-04-28 DIAGNOSIS — K2289 Other specified disease of esophagus: Secondary | ICD-10-CM | POA: Diagnosis not present

## 2021-04-28 DIAGNOSIS — K254 Chronic or unspecified gastric ulcer with hemorrhage: Secondary | ICD-10-CM | POA: Insufficient documentation

## 2021-04-28 DIAGNOSIS — D649 Anemia, unspecified: Secondary | ICD-10-CM | POA: Insufficient documentation

## 2021-04-28 DIAGNOSIS — K766 Portal hypertension: Secondary | ICD-10-CM | POA: Diagnosis not present

## 2021-04-28 HISTORY — DX: Cerebral infarction, unspecified: I63.9

## 2021-04-28 HISTORY — DX: Malignant neoplasm of colon, unspecified: C18.9

## 2021-04-28 HISTORY — DX: Personal history of other diseases of the circulatory system: Z86.79

## 2021-04-28 HISTORY — DX: Gastritis, unspecified, without bleeding: K29.70

## 2021-04-28 HISTORY — DX: Varicella without complication: B01.9

## 2021-04-28 HISTORY — PX: ESOPHAGOGASTRODUODENOSCOPY (EGD) WITH PROPOFOL: SHX5813

## 2021-04-28 HISTORY — DX: Other specified bacterial intestinal infections: A04.8

## 2021-04-28 SURGERY — ESOPHAGOGASTRODUODENOSCOPY (EGD) WITH PROPOFOL
Anesthesia: General

## 2021-04-28 MED ORDER — PROPOFOL 10 MG/ML IV BOLUS
INTRAVENOUS | Status: AC
  Filled 2021-04-28: qty 20

## 2021-04-28 MED ORDER — SODIUM CHLORIDE 0.9 % IV SOLN: INTRAVENOUS | Status: DC

## 2021-04-28 MED ORDER — PROPOFOL 500 MG/50ML IV EMUL
INTRAVENOUS | Status: DC | PRN
  Administered 2021-04-28: 150 ug/kg/min via INTRAVENOUS

## 2021-04-28 MED ORDER — PROPOFOL 10 MG/ML IV BOLUS
INTRAVENOUS | Status: DC | PRN
  Administered 2021-04-28: 60 mg via INTRAVENOUS

## 2021-04-28 MED ORDER — LIDOCAINE HCL (CARDIAC) PF 100 MG/5ML IV SOSY
PREFILLED_SYRINGE | INTRAVENOUS | Status: DC | PRN
  Administered 2021-04-28: 50 mg via INTRAVENOUS

## 2021-04-28 NOTE — Interval H&P Note (Signed)
History and Physical Interval Note:  04/28/2021 11:04 AM  Cameron Foster.  has presented today for surgery, with the diagnosis of Gastric Ulcer.  The various methods of treatment have been discussed with the patient and family. After consideration of risks, benefits and other options for treatment, the patient has consented to  Procedure(s): ESOPHAGOGASTRODUODENOSCOPY (EGD) WITH PROPOFOL (N/A) as a surgical intervention.  The patient's history has been reviewed, patient examined, no change in status, stable for surgery.  I have reviewed the patient's chart and labs.  Questions were answered to the patient's satisfaction.     [place], [place]

## 2021-04-28 NOTE — Transfer of Care (Signed)
Immediate Anesthesia Transfer of Care Note  Patient: Cameron Foster.  Procedure(s) Performed: ESOPHAGOGASTRODUODENOSCOPY (EGD) WITH PROPOFOL  Patient Location: PACU  Anesthesia Type:General  Level of Consciousness: sedated  Airway & Oxygen Therapy: Patient Spontanous Breathing  Post-op Assessment: Report given to RN and Post -op Vital signs reviewed and stable  Post vital signs: Reviewed and stable  Last Vitals:  Vitals Value Taken Time  BP 101/77 04/28/21 1121  Temp    Pulse 65 04/28/21 1121  Resp 13 04/28/21 1121  SpO2 94 % 04/28/21 1121    Last Pain:  Vitals:   04/28/21 1120  TempSrc:   PainSc: 0-No pain         Complications: No notable events documented.

## 2021-04-28 NOTE — Op Note (Signed)
[facility] Gastroenterology Patient Name: Cameron Foster Procedure Date: 04/28/2021 10:59 AM MRN: [id number] Account #: [id number] Date of Birth: Mar 09, 1941 Admit Type: Outpatient Age: 81 Room: [facility] ENDO ROOM 2 Gender: Male Note Status: Finalized Instrument Name: Michaelle Birks [id number] Procedure:             Upper GI endoscopy Indications:           Follow-up of gastric ulcer with hemorrhage Providers:             Benay Pike. Brittanya Winburn MD, MD Medicines:             Propofol per Anesthesia Complications:         No immediate complications. Procedure:             Pre-Anesthesia Assessment:                        - The risks and benefits of the procedure and the                         sedation options and risks were discussed with the                         patient. All questions were answered and informed                         consent was obtained.                        - Patient identification and proposed procedure were                         verified prior to the procedure by the nurse. The                         procedure was verified in the procedure room.                        - ASA Grade Assessment: III - A patient with severe                         systemic disease.                        - After reviewing the risks and benefits, the patient                         was deemed in satisfactory condition to undergo the                         procedure.                        After obtaining informed consent, the endoscope was                         passed under direct vision. Throughout the procedure,                         the patient's blood pressure, pulse, and oxygen  saturations were monitored continuously. The Endoscope                         was introduced through the mouth, and advanced to the                         third part of duodenum. The upper GI endoscopy was                         accomplished without  difficulty. The patient tolerated                         the procedure well. Findings:      The Z-line was irregular and was found at the gastroesophageal junction.       Mucosa was biopsied with a cold forceps for histology. One specimen       bottle was sent to pathology.      Mild, diffuse portal hypertensive gastropathy was found in the entire       examined stomach.      One non-bleeding healing gastric ulcer with no stigmata of bleeding was       found in the gastric antrum.      The examined duodenum was normal.      The exam was otherwise without abnormality. Impression:            - Z-line irregular, at the gastroesophageal junction.                         Biopsied.                        - Portal hypertensive gastropathy.                        - Non-bleeding gastric ulcer with no stigmata of                         bleeding.                        - Normal examined duodenum.                        - The examination was otherwise normal. Recommendation:        - Patient has a contact number available for                         emergencies. The signs and symptoms of potential                         delayed complications were discussed with the patient.                         Return to normal activities tomorrow. Written                         discharge instructions were provided to the patient.                        - Resume previous diet.                        -  Continue present medications.                        - Await pathology results.                        - No aspirin, ibuprofen, naproxen, or other                         non-steroidal anti-inflammatory drugs.                        - Return to my office in 3 months.                        - The findings and recommendations were discussed with                         the patient. Procedure Code(s):     --- Professional ---                        [phone], Esophagogastroduodenoscopy, flexible,                          transoral; with biopsy, single or multiple Diagnosis Code(s):     --- Professional ---                        K25.4, Chronic or unspecified gastric ulcer with                         hemorrhage                        K25.9, Gastric ulcer, unspecified as acute or chronic,                         without hemorrhage or perforation                        K31.89, Other diseases of stomach and duodenum                        K76.6, Portal hypertension                        K22.8, Other specified diseases of esophagus CPT copyright 2019 American Medical Association. All rights reserved. The codes documented in this report are preliminary and upon coder review may  be revised to meet current compliance requirements. Efrain Sella MD, MD 04/28/2021 11:20:23 AM This report has been signed electronically. Number of Addenda: 0 Note Initiated On: 04/28/2021 10:59 AM Estimated Blood Loss:  Estimated blood loss: none.      [facility]

## 2021-04-28 NOTE — Anesthesia Preprocedure Evaluation (Addendum)
Anesthesia Evaluation  Patient identified by MRN, date of birth, ID band Patient awake    Reviewed: Allergy & Precautions, NPO status , Patient's Chart, lab work & pertinent test results  History of Anesthesia Complications Negative for: history of anesthetic complications  Airway Mallampati: IV   Neck ROM: Full    Dental  (+)    Pulmonary Patient abstained from smoking., former smoker (quit 1990),    Pulmonary exam normal breath sounds clear to auscultation       Cardiovascular hypertension, + Peripheral Vascular Disease (carotid artery disease)  Normal cardiovascular exam+ dysrhythmias (isolated episode of a fib greater than 10 years ago)  Rhythm:Regular Rate:Normal  ECG 12/09/20: NSR, RBBB, LAFB, no change from prior   Neuro/Psych PSYCHIATRIC DISORDERS Depression Alcohol use disorder, pt reports glass of wine daily CVA, No Residual Symptoms    GI/Hepatic PUD, GERD  ,  Endo/Other  negative endocrine ROS  Renal/GU Renal disease (stage III CKD)   Prostate CA    Musculoskeletal   Abdominal   Peds  Hematology  (+) Blood dyscrasia, anemia ,   Anesthesia Other Findings Reviewed 12/09/20 cardiology note.  Reproductive/Obstetrics                            Anesthesia Physical Anesthesia Plan  ASA: 3  Anesthesia Plan: General   Post-op Pain Management:    Induction: Intravenous  PONV Risk Score and Plan: 2 and Propofol infusion, TIVA and Treatment may vary due to age or medical condition  Airway Management Planned: Natural Airway  Additional Equipment:   Intra-op Plan:   Post-operative Plan:   Informed Consent: I have reviewed the patients History and Physical, chart, labs and discussed the procedure including the risks, benefits and alternatives for the proposed anesthesia with the patient or authorized representative who has indicated his/her understanding and acceptance.        Plan Discussed with: CRNA  Anesthesia Plan Comments: (LMA/GETA backup discussed.  Patient consented for risks of anesthesia including but not limited to:  - adverse reactions to medications - damage to eyes, teeth, lips or other oral mucosa - nerve damage due to positioning  - sore throat or hoarseness - damage to heart, brain, nerves, lungs, other parts of body or loss of life  Informed patient about role of CRNA in peri- and intra-operative care.  Patient voiced understanding.)        Anesthesia Quick Evaluation

## 2021-04-28 NOTE — H&P (Signed)
Outpatient short stay form Pre-procedure 04/28/2021 10:35 AM Cameron Cameron K. Cameron Foster, M.D.  Primary Physician: Cameron Foster, M.D.  Reason for visit:  F/u gastric ulcer  History of present illness:  Cameron Foster presents for hospital follow-up of gastrointestinal bleed with gastric ulcer diagnosed. Biopsies were negative for H. pylori gastritis. Patient had an irregular Z-line which was not biopsied to evaluate for Barrett's esophagus given recent gastrointestinal bleeding. Patient reports he feels well today. He denies any melena, hematemesis or hematochezia. He continues taking pantoprazole 40 mg daily. Patient denies any abdominal pain.    Current Facility-Administered Medications:    0.9 %  sodium chloride infusion, , Intravenous, Continuous, Cameron Foster, Cameron Pike, MD  Medications Prior to Admission  Medication Sig Dispense Refill Last Dose   amLODipine (NORVASC) 10 MG tablet Take 5 mg by mouth daily.   04/28/2021 at 0900   aspirin 81 MG EC tablet Take 81 mg by mouth daily.    04/28/2021 at 0900   atenolol (TENORMIN) 100 MG tablet Take 100 mg by mouth daily.   04/28/2021 at 0900   atorvastatin (LIPITOR) 40 MG tablet TAKE 1 TABLET BY MOUTH EVERY DAY 90 tablet 3 04/28/2021 at 0900   bisoprolol (ZEBETA) 5 MG tablet Take 5 mg by mouth daily.   04/28/2021 at 0900   docusate sodium (COLACE) 100 MG capsule Take 100 mg by mouth 2 (two) times daily.      furosemide (LASIX) 20 MG tablet TAKE 1 TABLET BY MOUTH EVERY DAY 90 tablet 1 04/28/2021 at 0900   HYDROcodone-acetaminophen (NORCO) 7.5-325 MG tablet Take 1 tablet by mouth every 6 (six) hours as needed for moderate pain.      meloxicam (MOBIC) 15 MG tablet Take 15 mg by mouth daily.   04/28/2021 at 0900   metoprolol tartrate (LOPRESSOR) 50 MG tablet Take 50 mg by mouth 2 (two) times daily.      Multiple Vitamin (MULTIVITAMIN) capsule Take 1 capsule by mouth daily.      omeprazole (PRILOSEC) 20 MG capsule Take 20 mg by mouth daily.   04/28/2021 at 0900    sildenafil (REVATIO) 20 MG tablet Take 20 mg by mouth 3 (three) times daily.      traMADol (ULTRAM) 50 MG tablet Take by mouth every 6 (six) hours as needed.      benzonatate (TESSALON) 100 MG capsule Take 1 capsule (100 mg total) by mouth 3 (three) times daily as needed for cough. 30 capsule 0    cetirizine (ZYRTEC) 10 MG tablet TAKE 1 TABLET BY MOUTH EVERY DAY 90 tablet 0    fluticasone (FLONASE) 50 MCG/ACT nasal spray Place 1 spray into both nostrils daily for 14 days. 9.9 mL 0    hydrocortisone 2.5 % cream APPLY TO AFFECTED AREA TWICE A DAY AS NEEDED FOR RASH 28 g 12    lisinopril (ZESTRIL) 40 MG tablet Take 1 tablet (40 mg total) by mouth daily. 30 tablet 0    meclizine (ANTIVERT) 12.5 MG tablet Take 1 tablet (12.5 mg total) by mouth 3 (three) times daily as needed for dizziness. (Patient not taking: Reported on 02/23/2021) 30 tablet 0    metoprolol succinate (TOPROL-XL) 50 MG 24 hr tablet TAKE 1 TABLET BY MOUTH DAILY. TAKE WITH OR IMMEDIATELY FOLLOWING A MEAL. 90 tablet 1    Multiple Vitamin (MULTIVITAMIN WITH MINERALS) TABS tablet Take 1 tablet by mouth daily. (Patient taking differently: Take 1 tablet by mouth daily. Equate Complete) 30 tablet 0    pantoprazole (PROTONIX) 40 MG  tablet Take 1 tablet (40 mg total) by mouth 2 (two) times daily. To heal ulcers. (Patient taking differently: Take 40 mg by mouth daily. To heal ulcers.) 60 tablet 2    sertraline (ZOLOFT) 50 MG tablet Take 2 tablets (100 mg total) by mouth daily.      vitamin B-12 (CYANOCOBALAMIN) 1000 MCG tablet Take 1,000 mcg by mouth daily.        Allergies  Allergen Reactions   Formaldehyde Rash     Past Medical History:  Diagnosis Date   Alcoholism ([place])    Arthritis    Atrial fibrillation ([place]) 1995   one episode   Basal cell carcinoma 04/26/2017   Right medial cheek. Superficial and nodular   Basal cell carcinoma 06/11/2019   Left anterior shoulder. Nodular pattern   Basal cell carcinoma 09/09/2019   Right nasal  ala, [facility]   Basal cell carcinoma 02/05/2020   L upper eyebrow, [facility]    Carotid arterial disease ([place])    a. 06/24/2019 Carotid U/S: <50% bilat ICA stenoses.   Chicken pox    Colon cancer ([facility])    Colon polyp    Depression    Son died 2014-06-23   Diverticulitis    Diverticulosis 30 years   Diverticulosis    Dysrhythmia    Gastritis    GERD (gastroesophageal reflux disease)    H. pylori infection    History of cerebral hemorrhage    History of echocardiogram    a. 24-Jun-2019 Echo: EF 55-60%, no rwma, triv MR/AI.   History of stress test    a. 09/2015 MV: No ischemia/infarct. EF 45-54% (nl by echo).   Hyperlipidemia    Hypertension    Irritable bowel syndrome    Prostate cancer ([place]) 06/23/2010   treated with radiation therapy. Prostate   PSVT (paroxysmal supraventricular tachycardia) ([place])    a. 06/2019 [facility]: Avg rate 74 (54-120), occas PACs, rare PVCs, 125 episodes of PVCs (longest 17.5 secs; max rate 187). No afib.   Squamous cell carcinoma of skin 07/18/2017   Left medial calf. KA type   Squamous cell carcinoma of skin 04/24/2018   Right above med. brow   Squamous cell carcinoma of skin 06/11/2019   Right posterior shoulder. SCCis, hypertrophic   Squamous cell carcinoma of skin 01/09/2020   Mid nasal dorsum, MOHS, Efudex x 4wks   Stroke [facility])    Vasovagal syncope     Review of systems:  Otherwise negative.    Physical Exam  Gen: Alert, oriented. Appears stated age.  HEENT: [place]/AT. PERRLA. Lungs: CTA, no wheezes. CV: RR nl S1, S2. Abd: soft, benign, no masses. BS+ Ext: No edema. Pulses 2+    Planned procedures: Proceed with EGD. The patient understands the nature of the planned procedure, indications, risks, alternatives and potential complications including but not limited to bleeding, infection, perforation, damage to internal organs and possible oversedation/side effects from anesthesia. The patient agrees and gives consent to proceed.  Please refer to procedure notes for findings,  recommendations and patient disposition/instructions.     Cameron Foster K. Cameron Foster, M.D. Gastroenterology 04/28/2021  10:35 AM

## 2021-04-28 NOTE — Anesthesia Postprocedure Evaluation (Signed)
Anesthesia Post Note  Patient: Cameron Foster.  Procedure(s) Performed: ESOPHAGOGASTRODUODENOSCOPY (EGD) WITH PROPOFOL  Patient location during evaluation: PACU Anesthesia Type: General Level of consciousness: awake and alert, oriented and patient cooperative Pain management: pain level controlled Vital Signs Assessment: post-procedure vital signs reviewed and stable Respiratory status: spontaneous breathing, nonlabored ventilation and respiratory function stable Cardiovascular status: blood pressure returned to baseline and stable Postop Assessment: adequate PO intake Anesthetic complications: no   No notable events documented.   Last Vitals:  Vitals:   04/28/21 1121 04/28/21 1130  BP: 101/77 114/75  Pulse: 65 67  Resp: 13 16  Temp:    SpO2: 94% 96%    Last Pain:  Vitals:   04/28/21 1150  TempSrc:   PainSc: 0-No pain                 Darrin Nipper

## 2021-04-28 NOTE — Anesthesia Procedure Notes (Signed)
Date/Time: 04/28/2021 11:08 AM Performed by: Johnna Acosta, CRNA Pre-anesthesia Checklist: Patient identified, Emergency Drugs available, Suction available, Patient being monitored and Timeout performed Patient Re-evaluated:Patient Re-evaluated prior to induction Oxygen Delivery Method: Nasal cannula Preoxygenation: Pre-oxygenation with 100% oxygen Induction Type: IV induction

## 2021-04-29 ENCOUNTER — Encounter: Payer: Self-pay | Admitting: Internal Medicine

## 2021-04-29 LAB — SURGICAL PATHOLOGY

## 2021-05-05 ENCOUNTER — Telehealth: Payer: Self-pay

## 2021-05-05 NOTE — Progress Notes (Signed)
Chronic Care Management Pharmacy Assistant   Name: Cameron Foster.  MRN: [id number] DOB: 12/31/1940  Reason for Encounter: Medication Review/General Adherence Call.   Recent office visits:  NO recent office visit  Recent consult visits:  04/20/2021 Apolonio Schneiders FNP [facility] Medicine) Start benzonatate 100 mg, start prednisone 10 mg, start doxycycline 100 mg 02/25/2021 Dr. Alice Reichert MD (Gastroenterology) No Medication Changes noted 02/23/2021 Dr. Nicole Kindred MD (Dermatology) NO Medication changes noted  Hospital visits:  Medication Reconciliation was completed by comparing discharge summary, patients EMR and Pharmacy list, and upon discussion with patient.  Admitted to the hospital on 04/28/2021 due to Esophagogastroduodenoscopy. Discharge date was 04/28/2021. Discharged from [place]?Medications Started at [facility] Discharge:?? -started None  Medication Changes at Hospital Discharge: -Changed None  Medications Discontinued at Hospital Discharge: -Stopped None  Medications that remain the same after Hospital Discharge:??  -All other medications will remain the same.    Medications: Outpatient Encounter Medications as of 05/05/2021  Medication Sig   amLODipine (NORVASC) 10 MG tablet Take 5 mg by mouth daily.   aspirin 81 MG EC tablet Take 81 mg by mouth daily.    atenolol (TENORMIN) 100 MG tablet Take 100 mg by mouth daily.   atorvastatin (LIPITOR) 40 MG tablet TAKE 1 TABLET BY MOUTH EVERY DAY   benzonatate (TESSALON) 100 MG capsule Take 1 capsule (100 mg total) by mouth 3 (three) times daily as needed for cough.   bisoprolol (ZEBETA) 5 MG tablet Take 5 mg by mouth daily.   cetirizine (ZYRTEC) 10 MG tablet TAKE 1 TABLET BY MOUTH EVERY DAY   docusate sodium (COLACE) 100 MG capsule Take 100 mg by mouth 2 (two) times daily.   fluticasone (FLONASE) 50 MCG/ACT nasal spray Place 1 spray into both nostrils daily for 14 days.   furosemide (LASIX) 20 MG  tablet TAKE 1 TABLET BY MOUTH EVERY DAY   HYDROcodone-acetaminophen (NORCO) 7.5-325 MG tablet Take 1 tablet by mouth every 6 (six) hours as needed for moderate pain.   hydrocortisone 2.5 % cream APPLY TO AFFECTED AREA TWICE A DAY AS NEEDED FOR RASH   lisinopril (ZESTRIL) 40 MG tablet Take 1 tablet (40 mg total) by mouth daily.   meclizine (ANTIVERT) 12.5 MG tablet Take 1 tablet (12.5 mg total) by mouth 3 (three) times daily as needed for dizziness. (Patient not taking: Reported on 02/23/2021)   meloxicam (MOBIC) 15 MG tablet Take 15 mg by mouth daily.   metoprolol succinate (TOPROL-XL) 50 MG 24 hr tablet TAKE 1 TABLET BY MOUTH DAILY. TAKE WITH OR IMMEDIATELY FOLLOWING A MEAL.   metoprolol tartrate (LOPRESSOR) 50 MG tablet Take 50 mg by mouth 2 (two) times daily.   Multiple Vitamin (MULTIVITAMIN WITH MINERALS) TABS tablet Take 1 tablet by mouth daily. (Patient taking differently: Take 1 tablet by mouth daily. Equate Complete)   Multiple Vitamin (MULTIVITAMIN) capsule Take 1 capsule by mouth daily.   omeprazole (PRILOSEC) 20 MG capsule Take 20 mg by mouth daily.   pantoprazole (PROTONIX) 40 MG tablet Take 1 tablet (40 mg total) by mouth 2 (two) times daily. To heal ulcers. (Patient taking differently: Take 40 mg by mouth daily. To heal ulcers.)   sertraline (ZOLOFT) 50 MG tablet Take 2 tablets (100 mg total) by mouth daily.   sildenafil (REVATIO) 20 MG tablet Take 20 mg by mouth 3 (three) times daily.   traMADol (ULTRAM) 50 MG tablet Take by mouth every 6 (six) hours as needed.  vitamin B-12 (CYANOCOBALAMIN) 1000 MCG tablet Take 1,000 mcg by mouth daily.   No facility-administered encounter medications on file as of 05/05/2021.    Care Gaps: Shingrix Vaccine Pneumonia Vaccin COVID-19 Vaccine  Star Rating Drugs: Lisinopril 40 mg last filled 02/10/2021 [facility]. Atorvastatin 40 mg last filled 03/31/2021 [facility] day supply at [facility]. Medication fill Gaps: None  Called  patient and discussed medication adherence  with patient, no issues at this time with current medication.   Patient Denies ED visit since his last CPP follow up.  Patient Denies  any side effects with his medication. Patient Denies  any problems with hiscurrent pharmacy  Patient is asking if we can mail him a card with the clinical pharmacist, and my information incase he needs to reach out to [place].Inform the clinical pharmacist.   Per Clinical pharmacist,we don't have anything like that, feel free to share your number and my number.  Patient denies any other concerns for the clinical pharmacist at this time.   Telephone follow up appointment with care management team member scheduled for:  05/31/2020 at 11:00 AM  Bessie Norton Pharmacist Assistant [phone]

## 2021-05-10 ENCOUNTER — Other Ambulatory Visit: Payer: Self-pay | Admitting: Family Medicine

## 2021-05-10 NOTE — Telephone Encounter (Signed)
Pt called, states he has been taking 39m for about 2 months now is ok to go down to 25. Pt states he still has some pills left of 511mso advised him he can break them in half if they are scored and take 2571mnd I would let Dr. B know so a new prescription can be sent in.

## 2021-05-10 NOTE — Telephone Encounter (Signed)
Returned pt's call, pt states that he has already spoken with someone from the office and the Zoloft dose has been decided and called in.

## 2021-05-10 NOTE — Telephone Encounter (Signed)
Spoke at visit in 8/22 about tapering Zoloft. Can we see if he is actually still taking it and how much please? Ok to refill if he is still taking it.

## 2021-05-10 NOTE — Telephone Encounter (Signed)
LOV:01/26/2021 LR:01/14/21 NEOV: with [facility] 05/24/2021

## 2021-05-11 NOTE — Telephone Encounter (Signed)
Mabe, Lynnea Maizes, RN to [place]      4:49 PM Dr. Jacinto Reap, see TE and send new dosage in if possible        4:48 PM Evalee Jefferson, Satira Anis, RN routed this conversation to [facility]  Mabe, Lynnea Maizes, RN      4:47 PM Note Pt called, states he has been taking 35m for about 2 months now is ok to go down to 25. Pt states he still has some pills left of 563mso advised him he can break them in half if they are scored and take 2525mnd I would let Dr. B know so a new prescription can be sent in.

## 2021-05-12 MED ORDER — SERTRALINE HCL 25 MG PO TABS: 25.0000 mg | ORAL_TABLET | Freq: Every day | ORAL | 1 refills | Status: DC

## 2021-05-12 NOTE — Addendum Note (Signed)
Addended by: Doristine Devoid on: 05/12/2021 02:54 PM   Modules accepted: Orders

## 2021-05-12 NOTE — Telephone Encounter (Signed)
Please send in Zoloft 23m daily #30 r1.

## 2021-05-12 NOTE — Telephone Encounter (Signed)
Ordered

## 2021-05-24 ENCOUNTER — Other Ambulatory Visit: Payer: Self-pay

## 2021-05-24 ENCOUNTER — Ambulatory Visit (INDEPENDENT_AMBULATORY_CARE_PROVIDER_SITE_OTHER): Payer: Medicare Other

## 2021-05-24 VITALS — BP 150/80 | HR 63 | Temp 98.2°F | Ht 68.0 in | Wt 214.3 lb

## 2021-05-24 DIAGNOSIS — C44629 Squamous cell carcinoma of skin of left upper limb, including shoulder: Secondary | ICD-10-CM | POA: Insufficient documentation

## 2021-05-24 DIAGNOSIS — L570 Actinic keratosis: Secondary | ICD-10-CM | POA: Insufficient documentation

## 2021-05-24 DIAGNOSIS — Z0000 Encounter for general adult medical examination without abnormal findings: Secondary | ICD-10-CM | POA: Diagnosis not present

## 2021-05-24 NOTE — Progress Notes (Signed)
Subjective:   Cameron Winski. is a 81 y.o. male who presents for Medicare Annual/Subsequent preventive examination.  Review of Systems           Objective:    Today's Vitals   05/24/21 1351  BP: (!) 150/80  Pulse: 63  Temp: 98.2 F (36.8 C)  TempSrc: Oral  SpO2: 98%  Weight: 214 lb 4.8 oz (97.2 kg)  Height: 5' 8"  (1.727 m)   Body mass index is 32.58 kg/m.  Advanced Directives 04/28/2021 01/14/2021 01/13/2021 05/18/2020 03/14/2020 06/08/2019 06/08/2019  Does Patient Have a Medical Advance Directive? Yes Yes Yes Yes Yes Yes Yes  Type of Advance Directive - [place];Living will [place];Living will [place];Living will Living will [place];Living will [place];Living will  Does patient want to make changes to medical advance directive? - - - - No - Patient declined No - Patient declined -  Copy of [place] in Chart? - No - copy requested - Yes - validated most recent copy scanned in chart (See row information) - No - copy requested -  Would patient like information on creating a medical advance directive? - - - - No - Patient declined - -    Current Medications (verified) Outpatient Encounter Medications as of 05/24/2021  Medication Sig   amLODipine (NORVASC) 10 MG tablet Take 5 mg by mouth daily.   amLODipine (NORVASC) 5 MG tablet Take 5 mg by mouth daily.   aspirin 81 MG EC tablet Take 81 mg by mouth daily.    atenolol (TENORMIN) 100 MG tablet Take 100 mg by mouth daily.   atorvastatin (LIPITOR) 40 MG tablet TAKE 1 TABLET BY MOUTH EVERY DAY   benzonatate (TESSALON) 100 MG capsule Take 1 capsule (100 mg total) by mouth 3 (three) times daily as needed for cough.   bisoprolol (ZEBETA) 5 MG tablet Take 5 mg by mouth daily.   cetirizine (ZYRTEC) 10 MG tablet TAKE 1 TABLET BY MOUTH EVERY DAY   docusate sodium (COLACE) 100 MG capsule Take 100 mg by mouth 2 (two)  times daily.   fluticasone (FLONASE) 50 MCG/ACT nasal spray Place 1 spray into both nostrils daily for 14 days.   furosemide (LASIX) 20 MG tablet TAKE 1 TABLET BY MOUTH EVERY DAY   HYDROcodone-acetaminophen (NORCO) 7.5-325 MG tablet Take 1 tablet by mouth every 6 (six) hours as needed for moderate pain.   hydrocortisone 2.5 % cream APPLY TO AFFECTED AREA TWICE A DAY AS NEEDED FOR RASH   lisinopril (ZESTRIL) 40 MG tablet Take 1 tablet (40 mg total) by mouth daily.   meclizine (ANTIVERT) 12.5 MG tablet Take 1 tablet (12.5 mg total) by mouth 3 (three) times daily as needed for dizziness. (Patient not taking: Reported on 02/23/2021)   meclizine (ANTIVERT) 25 MG tablet Take 25 mg by mouth 2 (two) times daily as needed.   meloxicam (MOBIC) 15 MG tablet Take 15 mg by mouth daily.   metoprolol succinate (TOPROL-XL) 50 MG 24 hr tablet TAKE 1 TABLET BY MOUTH DAILY. TAKE WITH OR IMMEDIATELY FOLLOWING A MEAL.   metoprolol tartrate (LOPRESSOR) 50 MG tablet Take 50 mg by mouth 2 (two) times daily.   Multiple Vitamin (MULTIVITAMIN WITH MINERALS) TABS tablet Take 1 tablet by mouth daily. (Patient taking differently: Take 1 tablet by mouth daily. Equate Complete)   Multiple Vitamin (MULTIVITAMIN) capsule Take 1 capsule by mouth daily.   omeprazole (PRILOSEC) 20  MG capsule Take 20 mg by mouth daily.   pantoprazole (PROTONIX) 40 MG tablet Take 1 tablet (40 mg total) by mouth 2 (two) times daily. To heal ulcers. (Patient taking differently: Take 40 mg by mouth daily. To heal ulcers.)   predniSONE (STERAPRED UNI-PAK 21 TAB) 10 MG (21) TBPK tablet Take by mouth.   sertraline (ZOLOFT) 25 MG tablet Take 1 tablet (25 mg total) by mouth daily.   sildenafil (REVATIO) 20 MG tablet Take 20 mg by mouth 3 (three) times daily.   traMADol (ULTRAM) 50 MG tablet Take by mouth every 6 (six) hours as needed.   TUSSIN DM 100-10 MG/5ML liquid SMARTSIG:5 Milliliter(s) By Mouth Every 4 Hours PRN   vitamin B-12 (CYANOCOBALAMIN) 1000 MCG  tablet Take 1,000 mcg by mouth daily.   No facility-administered encounter medications on file as of 05/24/2021.    Allergies (verified) Formaldehyde   History: Past Medical History:  Diagnosis Date   Alcoholism ([place])    Arthritis    Atrial fibrillation ([place]) 1995   one episode   Basal cell carcinoma 04/26/2017   Right medial cheek. Superficial and nodular   Basal cell carcinoma 06/11/2019   Left anterior shoulder. Nodular pattern   Basal cell carcinoma 09/09/2019   Right nasal ala, EDC   Basal cell carcinoma 02/05/2020   L upper eyebrow, EDC    Carotid arterial disease ([place])    a. 06-26-2019 Carotid U/S: <50% bilat ICA stenoses.   Chicken pox    Colon cancer (HCC)    Colon polyp    Depression    Son died June 25, 2014   Diverticulitis    Diverticulosis 30 years   Diverticulosis    Dysrhythmia    Gastritis    GERD (gastroesophageal reflux disease)    H. pylori infection    History of cerebral hemorrhage    History of echocardiogram    a. 06/26/2019 Echo: EF 55-60%, no rwma, triv MR/AI.   History of stress test    a. 09/2015 MV: No ischemia/infarct. EF 45-54% (nl by echo).   Hyperlipidemia    Hypertension    Irritable bowel syndrome    Prostate cancer ([place]) Jun 25, 2010   treated with radiation therapy. Prostate   PSVT (paroxysmal supraventricular tachycardia) ([place])    a. 06/2019 Zio: Avg rate 74 (54-120), occas PACs, rare PVCs, 125 episodes of PVCs (longest 17.5 secs; max rate 187). No afib.   Squamous cell carcinoma of skin 07/18/2017   Left medial calf. KA type   Squamous cell carcinoma of skin 04/24/2018   Right above med. brow   Squamous cell carcinoma of skin 06/11/2019   Right posterior shoulder. SCCis, hypertrophic   Squamous cell carcinoma of skin 01/09/2020   Mid nasal dorsum, MOHS, Efudex x 4wks   Stroke [facility])    Vasovagal syncope    Past Surgical History:  Procedure Laterality Date   [place] no stents   CATARACT EXTRACTION, BILATERAL      COLON RESECTION SIGMOID N/A 12/07/2016   Procedure: COLON RESECTION SIGMOID;  Surgeon: Clayburn Pert, MD;  Location: [facility] ORS;  Service: General;  Laterality: N/A;   COLON SURGERY  11/2016   Colostomy   COLONOSCOPY  2014-06-25   COLONOSCOPY WITH PROPOFOL N/A 05/30/2016   Procedure: COLONOSCOPY WITH PROPOFOL;  Surgeon: Lollie Sails, MD;  Location[facility] ENDOSCOPY;  Service: Endoscopy;  Laterality: N/A;   COLOSTOMY Left 12/07/2016   Procedure: COLOSTOMY;  Surgeon: Clayburn Pert, MD;  Location: [facility] ORS;  Service: General;  Laterality: Left;   COLOSTOMY REVERSAL N/A 03/21/2017   Procedure: COLOSTOMY REVERSAL;  Surgeon: Clayburn Pert, MD;  Location: [facility] ORS;  Service: General;  Laterality: N/A;   COLOSTOMY TAKEDOWN N/A 03/21/2017   Procedure: LAPAROSCOPIC COLOSTOMY TAKEDOWN;  Surgeon: Clayburn Pert, MD;  Location: [facility] ORS;  Service: General;  Laterality: N/A;   CYSTOSCOPY WITH STENT PLACEMENT Bilateral 03/21/2017   Procedure: CYSTOSCOPY WITH LIGHTED STENT PLACEMENT;  Surgeon: Abbie Sons, MD;  Location: [facility] ORS;  Service: Urology;  Laterality: Bilateral;   ESOPHAGOGASTRODUODENOSCOPY     ESOPHAGOGASTRODUODENOSCOPY N/A 05/30/2016   Procedure: ESOPHAGOGASTRODUODENOSCOPY (EGD);  Surgeon: Lollie Sails, MD;  Location[facility] ENDOSCOPY;  Service: Endoscopy;  Laterality: N/A;   ESOPHAGOGASTRODUODENOSCOPY N/A 01/14/2021   Procedure: ESOPHAGOGASTRODUODENOSCOPY (EGD);  Surgeon: Toledo, Benay Pike, MD;  Location: [facility] ENDOSCOPY;  Service: Gastroenterology;  Laterality: N/A;   ESOPHAGOGASTRODUODENOSCOPY (EGD) WITH PROPOFOL N/A 04/28/2021   Procedure: ESOPHAGOGASTRODUODENOSCOPY (EGD) WITH PROPOFOL;  Surgeon: Toledo, Benay Pike, MD;  Location: [facility] ENDOSCOPY;  Service: Gastroenterology;  Laterality: N/A;   EYE SURGERY     cataracts   FLEXIBLE SIGMOIDOSCOPY N/A 03/21/2017   Procedure: FLEXIBLE SIGMOIDOSCOPY;  Surgeon: Clayburn Pert, MD;  Location: [facility] ORS;  Service: General;  Laterality: N/A;    FRACTURE SURGERY Bilateral    right arm and left wrist   INCISION AND DRAINAGE ABSCESS N/A 12/07/2016   Procedure: DRAINAGE  OF INTRA ABDOMINAL ABSCESS;  Surgeon: Clayburn Pert, MD;  Location: [facility] ORS;  Service: General;  Laterality: N/A;   KNEE ARTHROSCOPY     LAPAROTOMY N/A 12/07/2016   Procedure: EXPLORATORY LAPAROTOMY;  Surgeon: Clayburn Pert, MD;  Location: [facility] ORS;  Service: General;  Laterality: N/A;   PROSTATE SURGERY     Microwave therapy   TONSILLECTOMY     TOTAL KNEE ARTHROPLASTY Right 07/27/2016   Procedure: TOTAL KNEE ARTHROPLASTY;  Surgeon: [name], MD;  Location: [facility] ORS;  Service: Orthopedics;  Laterality: Right;  Dr. Erlene Quan had to place Urinary catheter due to prostate cancer history.  Using flexible scope.   Family History  Problem Relation Age of Onset   Lung cancer Father        smoker   Other Mother    Sudden death Son        due to "Blood clots"   Bipolar disorder Son    Heart disease Son    Kidney disease Daughter        congenital one small kidney   Prostate cancer Neg Hx    Bladder Cancer Neg Hx    Social History   Socioeconomic History   Marital status: Married    Spouse name: Not on file   Number of children: 2   Years of education: Not on file   Highest education level: Bachelor's degree (e.g., BA, AB, BS)  Occupational History   Occupation: retired Dance movement psychotherapist  Tobacco Use   Smoking status: Former    Packs/day: 1.00    Years: 27.00    Pack years: 27.00    Types: Cigarettes    Quit date: 04/18/1988    Years since quitting: 33.1   Smokeless tobacco: Current    Types: Chew  Vaping Use   Vaping Use: Never used  Substance and Sexual Activity   Alcohol use: Yes    Alcohol/week: 7.0 standard drinks    Types: 7 Glasses of wine per week    Comment: weekly-Formal heavy use ETOH   Drug use: No   Sexual activity: Not Currently  Birth control/protection: None    Comment: Married  Other Topics Concern   Not on file  Social  History Narrative   Not on file   Social Determinants of Health   Financial Resource Strain: Low Risk    Difficulty of Paying Living Expenses: Not hard at all  Food Insecurity: Not on file  Transportation Needs: Not on file  Physical Activity: Not on file  Stress: Not on file  Social Connections: Not on file    Tobacco Counseling Ready to quit: Not Answered Counseling given: Not Answered   Clinical Intake:  Pre-visit preparation completed: Yes  Pain : No/denies pain     Nutritional Risks: None Diabetes: No  How often do you need to have someone help you when you read instructions, pamphlets, or other written materials from your doctor or pharmacy?: 1 - Never  Diabetic?no  Interpreter Needed?: No  Information entered by :: Kirke Shaggy, LPN   Activities of Daily Living In your present state of health, do you have any difficulty performing the following activities: 09/17/2020  Hearing? N  Vision? N  Difficulty concentrating or making decisions? Y  Walking or climbing stairs? Y  Dressing or bathing? N  Doing errands, shopping? N  Some recent data might be hidden    Patient Care Team: [name], MD as PCP - General (Family Medicine) End, Harrell Gave, MD as PCP - Cardiology (Cardiology) End, Harrell Gave, MD as Consulting Physician (Cardiology) [name], MD (Orthopedic Surgery) Schnier, Dolores Lory, MD (Vascular Surgery) [name], MD (Dermatology) [name], MD as Consulting Physician (Ophthalmology) [name], [facility] (Pharmacist)  Indicate any recent Medical Services you may have received from other than [facility] providers in the past year (date may be approximate).     Assessment:   This is a routine wellness examination for [facility].  Hearing/Vision screen No results found.  Dietary issues and exercise activities discussed:     Goals Addressed   None    Depression Screen PHQ 2/9 Scores 09/17/2020 05/18/2020 01/14/2020  12/02/2019 08/14/2019 08/14/2019 05/15/2019  PHQ - 2 Score 0 0 0 0 0 0 0  PHQ- 9 Score 3 - 0 2 0 - 2    Fall Risk Fall Risk  09/17/2020 05/18/2020 01/14/2020 08/14/2019 05/15/2019  Falls in the past year? 0 1 0 0 0  Comment - accidental fall - - -  Number falls in past yr: 0 0 - 0 0  Comment - - - - -  Injury with Fall? 0 1 0 0 0  Comment - broken rib - - -  Risk for fall due to : No Fall Risks;History of fall(s) No Fall Risks - No Fall Risks No Fall Risks  Follow up Falls evaluation completed Falls prevention discussed - Falls evaluation completed Falls evaluation completed    [place]:  Any stairs in or around the home? No  If so, are there any without handrails? No  Home free of loose throw rugs in walkways, pet beds, electrical cords, etc? Yes  Adequate lighting in your home to reduce risk of falls? Yes   ASSISTIVE DEVICES UTILIZED TO PREVENT FALLS:  Life alert? No  Use of a cane, walker or w/c? No  Grab bars in the bathroom? No  Shower chair or bench in shower? No  Elevated toilet seat or a handicapped toilet? No   TIMED UP AND GO:  Was the test performed? Yes .  Length of time to ambulate 10  feet: 6 sec.   Gait slow and steady without use of assistive device  Cognitive Function:     6CIT Screen 09/02/2016  What Year? 0 points  What month? 0 points  What time? 0 points  Count back from 20 0 points  Months in reverse 0 points  Repeat phrase 0 points  Total Score 0    Immunizations Immunization History  Administered Date(s) Administered   Fluad Quad(high Dose 65+) 01/14/2020, 01/26/2021   Influenza, High Dose Seasonal PF 01/26/2017, 02/01/2018, 01/31/2019   Influenza,inj,Quad PF,6+ Mos 02/08/2016   Influenza,inj,quad, With Preservative 12/18/2014   PFIZER Comirnaty(Gray Top)Covid-19 Tri-Sucrose Vaccine 08/24/2020   PFIZER(Purple Top)SARS-COV-2 Vaccination 05/31/2019, 06/25/2019, 01/22/2020   Pneumococcal Conjugate-13 05/14/2018    Pneumococcal Polysaccharide-23 12/18/2014   Pneumococcal-Unspecified 12/18/2014   Tdap 01/14/2020    TDAP status: Up to date  Flu Vaccine status: Up to date  Pneumococcal vaccine status: Up to date  Covid-19 vaccine status: Completed vaccines  Qualifies for Shingles Vaccine? Yes   Zostavax completed No   Shingrix Completed?: No.    Education has been provided regarding the importance of this vaccine. Patient has been advised to call insurance company to determine out of pocket expense if they have not yet received this vaccine. Advised may also receive vaccine at local pharmacy or Health Dept. Verbalized acceptance and understanding.  Screening Tests Health Maintenance  Topic Date Due   Zoster Vaccines- Shingrix (1 of 2) Never done   COVID-19 Vaccine (5 - Booster for Pfizer series) 10/19/2020   TETANUS/TDAP  01/13/2030   Pneumonia Vaccine 29+ Years old  Completed   INFLUENZA VACCINE  Completed   HPV VACCINES  Aged Out   COLONOSCOPY (Pts 45-66yr Insurance coverage will need to be confirmed)  Discontinued    Health Maintenance  Health Maintenance Due  Topic Date Due   Zoster Vaccines- Shingrix (1 of 2) Never done   COVID-19 Vaccine (5 - Booster for Pfizer series) 10/19/2020    Colorectal cancer screening: Type of screening: Colonoscopy. Completed 05/30/16. Repeat every 10 years  Lung Cancer Screening: (Low Dose CT Chest recommended if Age 81-80years, 30 pack-year currently smoking OR have quit w/in 15years.) does not qualify.    Additional Screening:  Hepatitis C Screening: does not qualify; Completed no  Vision Screening: Recommended annual ophthalmology exams for early detection of glaucoma and other disorders of the eye. Is the patient up to date with their annual eye exam?  No  Who is the provider or what is the name of the office in which the patient attends annual eye exams? No one- declined referral If pt is not established with a provider, would they like to be  referred to a provider to establish care? No .   Dental Screening: Recommended annual dental exams for proper oral hygiene  Community Resource Referral / Chronic Care Management: CRR required this visit?  No   CCM required this visit?  No      Plan:     I have personally reviewed and noted the following in the patients chart:   Medical and social history Use of alcohol, tobacco or illicit drugs  Current medications and supplements including opioid prescriptions. Patient is not currently taking opioid prescriptions. Functional ability and status Nutritional status Physical activity Advanced directives List of other physicians Hospitalizations, surgeries, and ER visits in previous 12 months Vitals Screenings to include cognitive, depression, and falls Referrals and appointments  In addition, I have reviewed and discussed with patient certain preventive protocols,  quality metrics, and best practice recommendations. A written personalized care plan for preventive services as well as general preventive health recommendations were provided to patient.     Dionisio David, LPN   10/19/7494   Nurse Notes: none

## 2021-05-24 NOTE — Patient Instructions (Signed)
Mr. Cameron Foster , Thank you for taking time to come for your Medicare Wellness Visit. I appreciate your ongoing commitment to your health goals. Please review the following plan we discussed and let me know if I can assist you in the future.   Screening recommendations/referrals: Colonoscopy: aged out Recommended yearly ophthalmology/optometry visit for glaucoma screening and checkup Recommended yearly dental visit for hygiene and checkup  Vaccinations: Influenza vaccine: 01/26/21 Pneumococcal vaccine: 05/14/18 Tdap vaccine: 01/14/20 Shingles vaccine: n/d   Covid-19: 05/31/19, 06/25/19, 01/22/20, 08/24/20  Advanced directives: yes  Conditions/risks identified: none  Next appointment: Follow up in one year for your annual wellness visit. 05/25/22 @ 2:20pm in person  Preventive Care 81 Years and Older, Male Preventive care refers to lifestyle choices and visits with your health care provider that can promote health and wellness. What does preventive care include? A yearly physical exam. This is also called an annual well check. Dental exams once or twice a year. Routine eye exams. Ask your health care provider how often you should have your eyes checked. Personal lifestyle choices, including: Daily care of your teeth and gums. Regular physical activity. Eating a healthy diet. Avoiding tobacco and drug use. Limiting alcohol use. Practicing safe sex. Taking low doses of aspirin every day. Taking vitamin and mineral supplements as recommended by your health care provider. What happens during an annual well check? The services and screenings done by your health care provider during your annual well check will depend on your age, overall health, lifestyle risk factors, and family history of disease. Counseling  Your health care provider may ask you questions about your: Alcohol use. Tobacco use. Drug use. Emotional well-being. Home and relationship well-being. Sexual activity. Eating  habits. History of falls. Memory and ability to understand (cognition). Work and work Statistician. Screening  You may have the following tests or measurements: Height, weight, and BMI. Blood pressure. Lipid and cholesterol levels. These may be checked every 5 years, or more frequently if you are over 70 years old. Skin check. Lung cancer screening. You may have this screening every year starting at age 56 if you have a 30-pack-year history of smoking and currently smoke or have quit within the past 15 years. Fecal occult blood test (FOBT) of the stool. You may have this test every year starting at age 11. Flexible sigmoidoscopy or colonoscopy. You may have a sigmoidoscopy every 5 years or a colonoscopy every 10 years starting at age 51. Prostate cancer screening. Recommendations will vary depending on your family history and other risks. Hepatitis C blood test. Hepatitis B blood test. Sexually transmitted disease (STD) testing. Diabetes screening. This is done by checking your blood sugar (glucose) after you have not eaten for a while (fasting). You may have this done every 1-3 years. Abdominal aortic aneurysm (AAA) screening. You may need this if you are a current or former smoker. Osteoporosis. You may be screened starting at age 64 if you are at high risk. Talk with your health care provider about your test results, treatment options, and if necessary, the need for more tests. Vaccines  Your health care provider may recommend certain vaccines, such as: Influenza vaccine. This is recommended every year. Tetanus, diphtheria, and acellular pertussis (Tdap, Td) vaccine. You may need a Td booster every 10 years. Zoster vaccine. You may need this after age 29. Pneumococcal 13-valent conjugate (PCV13) vaccine. One dose is recommended after age 54. Pneumococcal polysaccharide (PPSV23) vaccine. One dose is recommended after age 26. Talk to your health  care provider about which screenings and  vaccines you need and how often you need them. This information is not intended to replace advice given to you by your health care provider. Make sure you discuss any questions you have with your health care provider. Document Released: 05/01/2015 Document Revised: 12/23/2015 Document Reviewed: 02/03/2015 Elsevier Interactive Patient Education  2017 [place] Prevention in the Home Falls can cause injuries. They can happen to people of all ages. There are many things you can do to make your home safe and to help prevent falls. What can I do on the outside of my home? Regularly fix the edges of walkways and driveways and fix any cracks. Remove anything that might make you trip as you walk through a door, such as a raised step or threshold. Trim any bushes or trees on the path to your home. Use bright outdoor lighting. Clear any walking paths of anything that might make someone trip, such as rocks or tools. Regularly check to see if handrails are loose or broken. Make sure that both sides of any steps have handrails. Any raised decks and porches should have guardrails on the edges. Have any leaves, snow, or ice cleared regularly. Use sand or salt on walking paths during winter. Clean up any spills in your garage right away. This includes oil or grease spills. What can I do in the bathroom? Use night lights. Install grab bars by the toilet and in the tub and shower. Do not use towel bars as grab bars. Use non-skid mats or decals in the tub or shower. If you need to sit down in the shower, use a plastic, non-slip stool. Keep the floor dry. Clean up any water that spills on the floor as soon as it happens. Remove soap buildup in the tub or shower regularly. Attach bath mats securely with double-sided non-slip rug tape. Do not have throw rugs and other things on the floor that can make you trip. What can I do in the bedroom? Use night lights. Make sure that you have a light by your  bed that is easy to reach. Do not use any sheets or blankets that are too big for your bed. They should not hang down onto the floor. Have a firm chair that has side arms. You can use this for support while you get dressed. Do not have throw rugs and other things on the floor that can make you trip. What can I do in the kitchen? Clean up any spills right away. Avoid walking on wet floors. Keep items that you use a lot in easy-to-reach places. If you need to reach something above you, use a strong step stool that has a grab bar. Keep electrical cords out of the way. Do not use floor polish or wax that makes floors slippery. If you must use wax, use non-skid floor wax. Do not have throw rugs and other things on the floor that can make you trip. What can I do with my stairs? Do not leave any items on the stairs. Make sure that there are handrails on both sides of the stairs and use them. Fix handrails that are broken or loose. Make sure that handrails are as long as the stairways. Check any carpeting to make sure that it is firmly attached to the stairs. Fix any carpet that is loose or worn. Avoid having throw rugs at the top or bottom of the stairs. If you do have throw rugs, attach them to  the floor with carpet tape. Make sure that you have a light switch at the top of the stairs and the bottom of the stairs. If you do not have them, ask someone to add them for you. What else can I do to help prevent falls? Wear shoes that: Do not have high heels. Have rubber bottoms. Are comfortable and fit you well. Are closed at the toe. Do not wear sandals. If you use a stepladder: Make sure that it is fully opened. Do not climb a closed stepladder. Make sure that both sides of the stepladder are locked into place. Ask someone to hold it for you, if possible. Clearly mark and make sure that you can see: Any grab bars or handrails. First and last steps. Where the edge of each step is. Use tools that  help you move around (mobility aids) if they are needed. These include: Canes. Walkers. Scooters. Crutches. Turn on the lights when you go into a dark area. Replace any light bulbs as soon as they burn out. Set up your furniture so you have a clear path. Avoid moving your furniture around. If any of your floors are uneven, fix them. If there are any pets around you, be aware of where they are. Review your medicines with your doctor. Some medicines can make you feel dizzy. This can increase your chance of falling. Ask your doctor what other things that you can do to help prevent falls. This information is not intended to replace advice given to you by your health care provider. Make sure you discuss any questions you have with your health care provider. Document Released: 01/29/2009 Document Revised: 09/10/2015 Document Reviewed: 05/09/2014 Elsevier Interactive Patient Education  2017 [employer].

## 2021-05-26 DIAGNOSIS — M1611 Unilateral primary osteoarthritis, right hip: Secondary | ICD-10-CM | POA: Diagnosis not present

## 2021-05-28 ENCOUNTER — Telehealth: Payer: Self-pay

## 2021-05-28 NOTE — Progress Notes (Signed)
Chronic Care Management APPOINTMENT REMINDER   Called Cameron [place]que [name] No answer, left message of appointment on 05/31/2021 at 11:00  via telephone visit with Cameron Foster , Pharm D. Notified to have all medications, supplements, blood pressure and/or blood sugar logs available during appointment and to return call if need to reschedule.   [facility] Pharmacist Assistant [phone]

## 2021-05-31 ENCOUNTER — Ambulatory Visit (INDEPENDENT_AMBULATORY_CARE_PROVIDER_SITE_OTHER): Payer: Medicare Other

## 2021-05-31 DIAGNOSIS — I10 Essential (primary) hypertension: Secondary | ICD-10-CM

## 2021-05-31 DIAGNOSIS — E785 Hyperlipidemia, unspecified: Secondary | ICD-10-CM

## 2021-05-31 NOTE — Progress Notes (Signed)
Chronic Care Management Pharmacy Note  06/16/2021 Name:  Cameron Foster. MRN:  [id number] DOB:  [birth year]  Summary: Patient presents for CCM follow-up.   Recommendations/Changes made from today's visit: -Continue current medications   Plan: CPP follow-up 3 months  Subjective: Cameron Foster. is an 81 y.o. year old male who is a primary patient of Bacigalupo, Dionne Bucy, MD.  The CCM team was consulted for assistance with disease management and care coordination needs.    Engaged with patient by telephone for follow up visit in response to provider referral for pharmacy case management and/or care coordination services.   Consent to Services:  The patient was given information about Chronic Care Management services, agreed to services, and gave verbal consent prior to initiation of services.  Please see initial visit note for detailed documentation.   Patient Care Team: [name], MD as PCP - General (Family Medicine) End, Harrell Gave, MD as PCP - Cardiology (Cardiology) End, Harrell Gave, MD as Consulting Physician (Cardiology) [name], MD (Orthopedic Surgery) Schnier, Dolores Lory, MD (Vascular Surgery) [name], MD (Dermatology) [name], MD as Consulting Physician (Ophthalmology) [name], [facility] (Pharmacist)  Recent office visits: 05/24/21: Patient presented to [facility], LPN for AWV.  4/76/54: Sertraline decreased to 25 mg daily.  01/30/2021 [name] MD (PCP) AMB referral to community care coordination 01/26/2021 [name] (PCP office) start guaiFENesin-dextromethorphan 100-10 mg/81m prn, start Prednisone 20 mg daily, change Meclizine 12.5 mg 3 time daily, start Zyrtec/Claritin by mouth, daily,Ambulatory referral to ENT  Recent consult visits: 12/09/2020 [name] (Cardiology) No medication changes noted, Return to clinic in 1 year 12/07/2020 [name] (Vascular Surgery) No Medication Changes noted,  Return in  about 6 months  09/09/2020 [name] (Urology) start Sildenafil Citrate 20 mg daily   Hospital visits: Admitted to the hospital on 01/13/2021 due to Melena. Discharge date was 01/14/2021. [facility]   Medication Changes at Hospital Discharge: -Changed none   Medications Discontinued at Hospital Discharge: -Stopped meloxicam ,stop omeprazole   Medications that remain the same after Hospital Discharge:??  -All other medications will remain the same.     Admitted to the hospital on 11/25/2020 due to Viral URI with Cough. Discharge date was 11/25/2020. [facility] -started albuterol 108 MCG/ACT inhaler  -started azelastine 0.1 % nasal spray   Medication Changes at Hospital Discharge: -Changed none   Medications Discontinued at Hospital Discharge: -Stopped none   Medications that remain the same after Hospital Discharge:??  -All other medications will remain the same.   Objective:  Lab Results  Component Value Date   CREATININE 1.27 (H) 01/13/2021   BUN 53 (H) 01/13/2021   GFR 57.85 (L) 02/21/2018   GFRNONAA 57 (L) 01/13/2021   GFRAA 62 08/15/2019   NA 138 01/13/2021   K 4.1 01/13/2021   CALCIUM 9.3 01/13/2021   CO2 25 01/13/2021   GLUCOSE 115 (H) 01/13/2021    Lab Results  Component Value Date/Time   HGBA1C 5.4 09/24/2020 03:12 PM   HGBA1C 5.6 06/09/2019 05:27 AM   HGBA1C 5.3 10/11/2018 12:00 AM   GFR 57.85 (L) 02/21/2018 11:56 AM   GFR 61.28 04/19/2017 03:50 PM    Last diabetic Eye exam: No results found for: HMDIABEYEEXA  Last diabetic Foot exam: No results found  for: HMDIABFOOTEX   Lab Results  Component Value Date   CHOL 156 08/15/2019   HDL 46 08/15/2019   LDLCALC 85 08/15/2019   TRIG 144 08/15/2019   CHOLHDL 4.4 06/09/2019    Hepatic Function  Latest Ref Rng & Units 01/13/2021 09/24/2020 06/08/2019  Total Protein 6.5 - 8.1 g/dL 6.8 6.9 7.2  Albumin 3.5 - 5.0 g/dL 3.6 4.1 3.9  AST 15 - 41 U/L _0 ALT 0 - 44 U/L _1 Alk Phosphatase 38 - 126 U/L 60 88 75  Total Bilirubin 0.3 - 1.2 mg/dL 0.7 0.5 0.8    Lab Results  Component Value Date/Time   TSH 5.605 (H) 10/20/2018 09:46 PM   TSH 2.49 10/11/2018 12:00 AM   TSH 1.88 01/20/2015 11:22 AM   FREET4 0.66 10/21/2018 01:29 PM    CBC Latest Ref Rng & Units 01/14/2021 01/14/2021 01/13/2021  WBC 4.0 - 10.5 K/uL - - 7.4  Hemoglobin 13.0 - 17.0 g/dL 8.5(L) 7.9(L) 9.8(L)  Hematocrit 39.0 - 52.0 % - - 29.8(L)  Platelets 150 - 400 K/uL - - 234    No results found for: VD25OH  Clinical ASCVD: Yes  The ASCVD Risk score (Arnett DK, et al., 2019) failed to calculate for the following reasons:   The 2019 ASCVD risk score is only valid for ages 68 to 48   The patient has a prior MI or stroke diagnosis    Depression screen [facility] 2/9 06/08/2021 05/24/2021 09/17/2020  Decreased Interest 3 0 0  Down, Depressed, Hopeless 2 0 0  PHQ - 2 Score 5 0 0  Altered sleeping 1 - 0  Tired, decreased energy 3 - 3  Change in appetite 0 - 0  Feeling bad or failure about yourself  0 - 0  Trouble concentrating 0 - 0  Moving slowly or fidgety/restless 0 - 0  Suicidal thoughts 0 - 0  PHQ-9 Score 9 - 3  Difficult doing work/chores Extremely dIfficult - Not difficult at all  Some recent data might be hidden    Social History   Tobacco Use  Smoking Status Former   Packs/day: 1.00   Years: 27.00   Pack years: 27.00   Types: Cigarettes   Quit date: 04/18/1988   Years since quitting: 33.1  Smokeless Tobacco Current   Types: Chew   BP Readings from Last 3 Encounters:  06/08/21 107/74  05/24/21 (!) 150/80  04/28/21 114/75   Pulse Readings from Last 3 Encounters:  06/08/21 80  05/24/21 63  04/28/21 67   Wt Readings from Last 3 Encounters:  06/08/21 211 lb (95.7 kg)  05/24/21 214 lb 4.8 oz (97.2  kg)  04/28/21 200 lb (90.7 kg)   BMI Readings from Last 3 Encounters:  06/08/21 32.08 kg/m  05/24/21 32.58 kg/m  04/28/21 30.41 kg/m    Assessment/Interventions: Review of patient past medical history, allergies, medications, health status, including review of consultants reports, laboratory and other test data, was performed as part of comprehensive evaluation and provision of chronic care management services.   SDOH:  (Social Determinants of Health) assessments and interventions performed: Yes SDOH Interventions    Flowsheet Row Most Recent Value  SDOH Interventions   Financial Strain Interventions Intervention Not Indicated       SDOH Screenings   Alcohol Screen: Low Risk    Last Alcohol Screening Score (AUDIT): 4  Depression (PHQ2-9): Medium Risk   PHQ-2 Score: 9  Financial Resource Strain: Low Risk  of Paying Living Expenses: Not hard at all  °Food Insecurity: No Food Insecurity  ° Worried About Running Out of Food in the Last Year: Never true  ° Ran Out of Food in the Last Year: Never true  °Housing: Low Risk   ° Last Housing Risk Score: 0  °Physical Activity: Inactive  ° Days of Exercise per Week: 0 days  ° Minutes of Exercise per Session: 0 min  °Social Connections: Moderately Integrated  ° Frequency of Communication with Friends and Family: More than three times a week  ° Frequency of Social Gatherings with Friends and Family: Once a week  ° Attends Religious Services: More than 4 times per year  ° Active Member of Clubs or Organizations: No  ° Attends Club or Organization Meetings: Never  ° Marital Status: Married  °Stress: No Stress Concern Present  ° Feeling of Stress : Only a little  °Tobacco Use: High Risk  ° Smoking Tobacco Use: Former  ° Smokeless Tobacco Use: Current  ° Passive Exposure: Not on file  °Transportation Needs: No Transportation Needs  ° Lack of Transportation (Medical): No  ° Lack of Transportation (Non-Medical): No  ° ° °CCM Care  Plan ° °Allergies  °Allergen Reactions  ° Formaldehyde Rash  ° ° °Medications Reviewed Today   ° ° Reviewed by Blakley, Melissa R, CPhT (Pharmacy Technician) on 06/11/21 at 1610  Med List Status: Complete  ° °Medication Order Taking? Sig Documenting Provider Last Dose Status Informant  °amLODipine (NORVASC) 5 MG tablet [id number] Yes Take 5 mg by mouth daily. [provider]  Active Self  °amoxicillin-clavulanate (AUGMENTIN) 875-125 MG tablet [id number] No Take 1 tablet by mouth 2 (two) times daily for 5 days.  °Patient not taking: Reported on 06/11/2021  ° Mecum, Erin E, PA-C Not Taking Active Self  °aspirin 81 MG EC tablet [id number] Yes Take 81 mg by mouth daily.  [provider]  Active Self  °atorvastatin (LIPITOR) 40 MG tablet [id number] Yes TAKE 1 TABLET BY MOUTH EVERY DAY Bacigalupo, Angela M, MD  Active Self  °fluticasone (FLONASE) 50 MCG/ACT nasal spray [id number] Yes Place 1 spray into both nostrils daily. [provider]  Active Self  °furosemide (LASIX) 20 MG tablet [id number] Yes TAKE 1 TABLET BY MOUTH EVERY DAY End, Christopher, MD  Active Self  °guaiFENesin (MUCINEX) 600 MG 12 hr tablet [id number] Yes Take 600 mg by mouth daily as needed (congestion). [provider]  Active Self  °hydrocortisone 2.5 % cream [id number] Yes APPLY TO AFFECTED AREA TWICE A DAY AS NEEDED FOR RASH Bacigalupo, Angela M, MD  Active Self  °lisinopril (ZESTRIL) 40 MG tablet [id number] Yes Take 1 tablet (40 mg total) by mouth daily. Payne, Elise T, FNP  Active Self  °meclizine (ANTIVERT) 25 MG tablet [id number] Yes Take 25 mg by mouth daily. [provider]  Active Self  °metoprolol succinate (TOPROL-XL) 50 MG 24 hr tablet [id number] Yes TAKE 1 TABLET BY MOUTH DAILY. TAKE WITH OR IMMEDIATELY FOLLOWING A MEAL. Bacigalupo, Angela M, MD  Active Self  °Multiple Vitamin (MULTIVITAMIN WITH MINERALS) TABS tablet [id number] Yes Take 1 tablet by mouth daily.  °Patient taking differently: Take 1 tablet by  mouth daily. Vitacraves Men's gummies  ° Vachhani, Vaibhavkumar, MD  Active Self  ° Patient taking differently:   Discontinued 06/11/21 1604 (Expired Prescription)   °pantoprazole (PROTONIX) 40 MG tablet [id number] Yes Take 40 mg by mouth daily. [provider]  Active Self  °sertraline (ZOLOFT) 50 MG tablet [id number]   Yes Take 1 tablet (50 mg total) by mouth daily. Mecum, Erin E, PA-C  Active Self  °traMADol (ULTRAM) 50 MG tablet [id number] Yes Take 50 mg by mouth every 6 (six) hours as needed for severe pain. [provider]  Active Self  °vitamin B-12 (CYANOCOBALAMIN) 1000 MCG tablet [id number] Yes Take 1,000 mcg by mouth daily. [provider]  Active Self  ° °  °  ° °  ° ° °Patient Active Problem List  ° Diagnosis Date Noted  ° Actinic keratosis 05/24/2021  ° Squamous cell cancer of skin of left forearm 05/24/2021  ° Chronic gastric ulcer with hemorrhage 02/25/2021  ° Irregular Z line of esophagus 02/25/2021  ° Chronic pansinusitis 01/26/2021  ° Dizziness 01/26/2021  ° Chronic cough 01/26/2021  ° Need for influenza vaccination 01/26/2021  ° Hospital discharge follow-up 01/26/2021  ° Melena 01/13/2021  ° Acute blood loss anemia 01/13/2021  ° Alcohol use disorder, moderate, dependence (HCC) 01/13/2021  ° Stage 3a chronic kidney disease (HCC) 11/23/2020  ° Anemia 11/23/2020  ° Chronic pain of right ankle 05/21/2020  ° Encounter for attention to colostomy (HCC) 05/21/2020  ° Achilles tendinitis 04/30/2020  ° Synovitis and tenosynovitis 04/30/2020  ° Lymphedema 03/03/2020  ° Chronic venous insufficiency 03/03/2020  ° Fatigue 02/14/2020  ° Leg edema 02/14/2020  ° Foraminal stenosis of lumbar region 11/08/2019  ° Chronic bilateral low back pain without sciatica 10/18/2019  ° Foraminal stenosis of cervical region 10/18/2019  ° PSVT (paroxysmal supraventricular tachycardia) (HCC) 08/01/2019  ° Lacunar infarction (HCC) 07/10/2019  ° History of lacunar cerebrovascular accident (CVA) 07/01/2019  °  Cerebrovascular accident (CVA) (HCC) 06/08/2019  ° Paroxysmal atrial fibrillation (HCC) 11/07/2018  ° TIA (transient ischemic attack) 10/20/2018  ° History of subarachnoid hemorrhage 10/16/2018  ° Acute recurrent sinusitis 10/16/2018  ° Low libido 08/30/2018  ° Obesity 05/14/2018  ° Hyperlipidemia LDL goal <70 05/14/2018  ° Degeneration of lumbar intervertebral disc 03/22/2018  ° Osteoarthritis of hip 03/22/2018  ° Trochanteric bursitis of right hip 03/22/2018  ° History of colostomy reversal 03/21/2017  ° Status post partial colectomy 01/03/2017  ° Depression, major, single episode, mild (HCC) 01/03/2017  ° Vasovagal syncope 01/27/2016  ° History of skin cancer 10/28/2015  ° Osteoarthritis of right knee 10/01/2015  ° Essential hypertension 08/25/2015  ° Dyspnea on exertion 08/12/2015  ° Erectile dysfunction following radiation therapy 08/04/2015  ° Constipation 08/02/2015  ° History of prostate cancer 01/20/2015  ° Swelling of right lower extremity 01/20/2015  ° ° °Immunization History  °Administered Date(s) Administered  ° Fluad Quad(high Dose 65+) 01/14/2020, 01/26/2021  ° Influenza, High Dose Seasonal PF 01/26/2017, 02/01/2018, 01/31/2019  ° Influenza,inj,Quad PF,6+ Mos 02/08/2016  ° Influenza,inj,quad, With Preservative 12/18/2014  ° PFIZER Comirnaty(Gray Top)Covid-19 Tri-Sucrose Vaccine 08/24/2020  ° PFIZER(Purple Top)SARS-COV-2 Vaccination 05/31/2019, 06/25/2019, 01/22/2020  ° Pneumococcal Conjugate-13 05/14/2018  ° Pneumococcal Polysaccharide-23 12/18/2014  ° Pneumococcal-Unspecified 12/18/2014  ° Tdap 01/14/2020  ° ° °Conditions to be addressed/monitored:  °Hypertension, Hyperlipidemia, Atrial Fibrillation, Chronic Kidney Disease, Depression, Osteoarthritis, and History of TIA, CVA  ° °Care Plan : General Pharmacy (Adult)  °Updates made by ,  A, RPH since 06/16/2021 12:00 AM  °  ° °Problem: Hypertension, Hyperlipidemia, Atrial Fibrillation, Chronic Kidney Disease, Depression, Osteoarthritis,  and History of TIA, CVA   °Priority: High  °  ° °Long-Range Goal: Patient-Specific Goal   °Start Date: 02/25/2021  °Expected End Date: 02/25/2022  °This Visit's Progress: On track  °Recent Progress: On track  °Priority: High  °Note:   °  Note:   Current Barriers:  Unable to self administer medications as prescribed  Pharmacist Clinical Goal(s):  Patient will maintain control of blood pressure as evidenced by BP less than 140/90  through collaboration with PharmD and provider.   Interventions: 1:1 collaboration with [name], MD regarding development and update of comprehensive plan of care as evidenced by provider attestation and co-signature Inter-disciplinary care team collaboration (see longitudinal plan of care) Comprehensive medication review performed; medication list updated in electronic medical record  Hypertension (BP goal <140/90) -Uncontrolled -Current treatment: Amlodipine 5 mg daily (AM)  Furosemide 20 mg daily (AM)  Lisinopril 40 mg daily (AM)  Metoprolol XL 50 mg daily (AM)  -Medications previously tried: Atenolol, Bisoprolol, nebivolol  -Current home readings: 120/80, 133/83 -Current dietary habits: Eats what I want to eat. 1 cup of coffee. Ocassional sweet tea or regular soda. Eats out large meal once weekly. Does salt food.  -Current exercise habits: None -Denies hypotensive/hypertensive symptoms, but does not significant fatigue throughout the day.  -Query appropriate use of metoprolol in this patient given only one instance of A-Fib > 10 years ago. Could consider discontinuation to see if energy and fatigue symptoms improve.  -Recommended to continue current medication  Hyperlipidemia: (LDL goal < 70) -Uncontrolled -History of TIA, CVA  -Current treatment: Atorvastatin 40 mg daily (AM) -Medications previously tried: NA  -Recommended to continue current medication  Atrial Fibrillation (Goal: prevent stroke and major bleeding) -Controlled -CHADSVASC:  5 -Current treatment: Rate control: Metoprolol XL 50 mg daily  Anticoagulation: None due to history of subarachnoid hemorrhage -Medications previously tried: NA -Only one instance of A-fib documented >10 years ago -Recommended to continue current medication  Depression (Goal: Maintain symptom remission) -Controlled -Current treatment: Sertraline 25 mg daily  -Medications previously tried/failed: NA -PHQ9: 3 -Recommended to continue current medication  Chronic Kidney Disease Stage 3a  -All medications assessed for renal dosing and appropriateness in chronic kidney disease. -Recommended to continue current medication  Patient Goals/Self-Care Activities Patient will:  - check blood pressure weekly, document, and provide at future appointments  Follow Up Plan: Telephone follow up appointment with care management team member scheduled for:  08/30/2021 at 11:00 AM       Medication Assistance: None required.  Patient affirms current coverage meets needs.  Compliance/Adherence/Medication fill history: Care Gaps: Shingrix Vaccine Pneumonia Vaccine COVID-19 Vaccine  Star-Rating Drugs: Lisinopril 40 mg last filled 02/10/2021 [facility]. Atorvastatin 40 mg last filled 12/30/2020 [facility] day supply at [facility].  Patient's preferred pharmacy [facility]:  [id number] [place]Phone: [phone]: [phone]  Uses pill box? Yes Pt endorses 100% compliance  We discussed: Current pharmacy is preferred with insurance plan and patient is satisfied with pharmacy services Patient decided to: Continue current medication management strategy  Care Plan and Follow Up Patient Decision:  Patient agrees to Care Plan and Follow-up.  Plan: Telephone follow up appointment with care management team member scheduled for:  08/30/2021 at 11:00 AM  AJunius Argyle PharmD, BPara [phone]

## 2021-06-01 DIAGNOSIS — M1611 Unilateral primary osteoarthritis, right hip: Secondary | ICD-10-CM | POA: Diagnosis not present

## 2021-06-04 ENCOUNTER — Telehealth: Payer: Self-pay

## 2021-06-04 ENCOUNTER — Other Ambulatory Visit: Payer: Self-pay | Admitting: Family Medicine

## 2021-06-04 NOTE — Telephone Encounter (Signed)
Copied from [place] [id number]. Topic: General - Other >> Jun 04, 2021  4:20 PM Bayard Beaver wrote: Reason for PQD:IYMEBRA called to speak with Cristie Hem about his medications, he says are wrong

## 2021-06-08 ENCOUNTER — Encounter: Payer: Self-pay | Admitting: Physician Assistant

## 2021-06-08 ENCOUNTER — Ambulatory Visit (INDEPENDENT_AMBULATORY_CARE_PROVIDER_SITE_OTHER): Payer: Medicare Other | Admitting: Physician Assistant

## 2021-06-08 ENCOUNTER — Other Ambulatory Visit: Payer: Self-pay

## 2021-06-08 ENCOUNTER — Telehealth: Payer: Self-pay

## 2021-06-08 VITALS — BP 107/74 | HR 80 | Temp 98.1°F | Resp 16 | Wt 211.0 lb

## 2021-06-08 DIAGNOSIS — J0191 Acute recurrent sinusitis, unspecified: Secondary | ICD-10-CM

## 2021-06-08 DIAGNOSIS — F320 Major depressive disorder, single episode, mild: Secondary | ICD-10-CM | POA: Diagnosis not present

## 2021-06-08 MED ORDER — SERTRALINE HCL 50 MG PO TABS: 50.0000 mg | ORAL_TABLET | Freq: Every day | ORAL | 3 refills | Status: DC

## 2021-06-08 MED ORDER — AMOXICILLIN-POT CLAVULANATE 875-125 MG PO TABS: 1.0000 | ORAL_TABLET | Freq: Two times a day (BID) | ORAL | 0 refills | Status: AC

## 2021-06-08 NOTE — Progress Notes (Signed)
Established patient visit   Patient: Cameron Foster.   DOB: 1940/11/25   81 y.o. Male  MRN: [id number] Visit Date: 06/08/2021  Today's healthcare provider: Dani Gobble Mitchael Luckey, PA-C  Introduced myself to the patient as a Journalist, newspaper and provided education on APPs in clinical practice.    Chief Complaint  Patient presents with   Sinusitis   I,Cameron Foster,acting as a scribe for [employer], PA-C.,have documented all relevant documentation on the behalf of Cameron Ellerbrock E Kathey Simer, PA-C,as directed by  Cameron Panning E Ryder Chesmore, PA-C while in the presence of Cameron Penaflor E Berthold Glace, PA-C.  Subjective    HPI  Upper respiratory symptoms He complains of bilateral ear pressure/pain, congestion, headache described as pressure, non productive cough, shortness of breath, and sinus pressure.with no fever, chills, night sweats or weight loss. Onset of symptoms was  3 days ago and gradually worsening.He is drinking plenty of fluids.  Past history is significant for  chronic pansinusitis . Patient is non-smoker   States he is very congested and feels like he is unable to sleep well due to drainage.  States this started some time ago but became worse over last week and into the weekend He has tried a nettipot but states this did not provide much relief  Has tried Mucinex as well but did not provide much relief.  States at night he has to sleep with head of bed raised due to choking sensation - this has been going on for years but states this is worse not.  States his teeth have been hurting as well with current concern.  States he has tried Flonase in the past but this was not very beneficial to his symptoms Has not been to ENT for some time  He states he was instructed to use a CPAP machine for being a "shallow breather" but is not using it as directed   --------------------------------------------------------------------------------------------------- Depression, Follow-up  He  was last seen for this 6 months ago. Changes  made at last visit include taper zoloft.   He reports excellent compliance with treatment. Patient is currently doing Zoloft 84m daily He is not having side effects.   He reports excellent tolerance of treatment. Current symptoms include: depressed mood He feels he is Worse since last visit.  [facility] 06/08/2021 05/24/2021 09/17/2020  Decreased Interest 3 0 0  Down, Depressed, Hopeless 2 0 0  PHQ - 2 Score 5 0 0  Altered sleeping 1 - 0  Tired, decreased energy 3 - 3  Change in appetite 0 - 0  Feeling bad or failure about yourself  0 - 0  Trouble concentrating 0 - 0  Moving slowly or fidgety/restless 0 - 0  Suicidal thoughts 0 - 0  PHQ-9 Score 9 - 3  Difficult doing work/chores Extremely dIfficult - Not difficult at all  Some recent data might be hidden      States he is struggling with recent losses and his wife's cancer diagnoses over the past few years States he is worried about her and a new diagnosis of lung mass along with CHF  States he has an apt on the 12th for a hip replacement - reports constipation from pain management for hip pain  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     -----------------------------------------------------------------------------------------   Medications: Outpatient Medications Prior to Visit  Medication  Sig   amLODipine (NORVASC) 5 MG tablet Take 5 mg by mouth daily.   aspirin 81 MG EC tablet Take 81 mg by mouth daily.    atorvastatin (LIPITOR) 40 MG tablet TAKE 1 TABLET BY MOUTH EVERY DAY   furosemide (LASIX) 20 MG tablet TAKE 1 TABLET BY MOUTH EVERY DAY   hydrocortisone 2.5 % cream APPLY TO AFFECTED AREA TWICE A DAY AS NEEDED FOR RASH   lisinopril (ZESTRIL) 40 MG tablet Take 1 tablet (40 mg total) by mouth daily.   meclizine (ANTIVERT) 25 MG tablet Take 25 mg by mouth 2 (two) times daily as needed for  dizziness.   metoprolol succinate (TOPROL-XL) 50 MG 24 hr tablet TAKE 1 TABLET BY MOUTH DAILY. TAKE WITH OR IMMEDIATELY FOLLOWING A MEAL.   Multiple Vitamin (MULTIVITAMIN WITH MINERALS) TABS tablet Take 1 tablet by mouth daily. (Patient taking differently: Take 1 tablet by mouth daily. Vitacraves Men's gummies)   sertraline (ZOLOFT) 25 MG tablet Take 1 tablet (25 mg total) by mouth daily.   traMADol (ULTRAM) 50 MG tablet Take by mouth every 6 (six) hours as needed.   vitamin B-12 (CYANOCOBALAMIN) 1000 MCG tablet Take 1,000 mcg by mouth daily.   pantoprazole (PROTONIX) 40 MG tablet Take 1 tablet (40 mg total) by mouth 2 (two) times daily. To heal ulcers. (Patient taking differently: Take 40 mg by mouth daily. To heal ulcers.)   No facility-administered medications prior to visit.    Review of Systems  Constitutional:  Negative for appetite change, fatigue and fever.  HENT:  Positive for congestion and ear pain. Negative for sinus pressure and sore throat.   Respiratory:  Positive for shortness of breath (At night when laying on his back.). Negative for cough and wheezing.   Cardiovascular:  Negative for chest pain and palpitations.  Gastrointestinal:  Positive for constipation. Negative for diarrhea, nausea and vomiting.  Musculoskeletal:  Negative for arthralgias and myalgias.  Skin:  Negative for rash.  Neurological:  Positive for dizziness and headaches. Negative for light-headedness.  Psychiatric/Behavioral:  Positive for dysphoric mood. The patient is nervous/anxious.        Objective    BP 107/74 (BP Location: Left Arm, Patient Position: Sitting, Cuff Size: Large)    Pulse 80    Temp 98.1 F (36.7 C) (Oral)    Resp 16    Wt 211 lb (95.7 kg)    SpO2 95%    BMI 32.08 kg/m  BP Readings from Last 3 Encounters:  06/08/21 107/74  05/24/21 (!) 150/80  04/28/21 114/75   Wt Readings from Last 3 Encounters:  06/08/21 211 lb (95.7 kg)  05/24/21 214 lb 4.8 oz (97.2 kg)  04/28/21 200 lb  (90.7 kg)      Physical Exam Vitals reviewed.  Constitutional:      General: He is awake.     Appearance: Normal appearance. He is obese.  HENT:     Head: Normocephalic and atraumatic.     Right Ear: Hearing, tympanic membrane and ear canal normal.     Left Ear: Hearing, tympanic membrane and ear canal normal.     Nose: Congestion present. No rhinorrhea.     Right Turbinates: Not enlarged, swollen or pale.     Left Turbinates: Not enlarged, swollen or pale.     Mouth/Throat:     Mouth: Mucous membranes are moist.     Pharynx: Oropharynx is clear. Uvula midline. No oropharyngeal exudate or posterior oropharyngeal erythema.  Eyes:  Extraocular Movements: Extraocular movements intact.     Conjunctiva/sclera: Conjunctivae normal.     Pupils: Pupils are equal, round, and reactive to light.  Cardiovascular:     Rate and Rhythm: Normal rate and regular rhythm.     Pulses: Normal pulses.     Heart sounds: Normal heart sounds. No murmur heard.   No friction rub. No gallop.  Pulmonary:     Effort: Pulmonary effort is normal.     Breath sounds: Normal breath sounds. No wheezing, rhonchi or rales.  Musculoskeletal:     Cervical back: Normal range of motion.  Neurological:     Mental Status: He is alert.  Psychiatric:        Behavior: Behavior is cooperative.      No results found for any visits on 06/08/21.  Assessment & Plan     Problem List Items Addressed This Visit       Respiratory   Acute recurrent sinusitis - Primary    Acute, recurrent condition States Flonase, Nettipot and Mucinex have not provided relief Denies systemic symptoms making me more suspicious of acute bacterial sinusitis  Provided Rx for Augmentin to assist with resolution and informed him he can continue with flonase and antihistamine for further relief Recommend he follow up with ENT if this persists.        Relevant Medications   amoxicillin-clavulanate (AUGMENTIN) 875-125 MG tablet      Other   Depression, major, single episode, mild (HCC)    Chronic, historic condition  Recommend increasing Sertraline to 45m PO QD to assist with current episode and developing stressors.  Will consider increasing to 1081mafter several weeks at increased dose to avoid potential SE from rapid increase.  Follow up in 4 weeks recommended to assess response to increased dose       Relevant Medications   sertraline (ZOLOFT) 50 MG tablet     Return in about 4 weeks (around 07/06/2021) for Depression follow up- prefers virtual. .   I, Irelyn Perfecto E Asuka Dusseau, PA-C, have reviewed all documentation for this visit. The documentation on 06/08/21 for the exam, diagnosis, procedures, and orders are all accurate and complete.   Alexiz Sustaita, ErGlennie IslePH BuSt. ElizabethPA-C  [phone]) [phone])  [place]

## 2021-06-08 NOTE — Assessment & Plan Note (Addendum)
Chronic, historic condition  Recommend increasing Sertraline to 40m PO QD to assist with current episode and developing stressors.  Will consider increasing to 1013mafter several weeks at increased dose to avoid potential SE from rapid increase.  Follow up in 4 weeks recommended to assess response to increased dose

## 2021-06-08 NOTE — Patient Instructions (Addendum)
You can use Flonase to help with some of the sinus congestion as well as Mucinex  Be sure to stay well hydrated as this will help reduce the thickness of secretions I am sending in a script for an antibiotic called Augmentin that I would like you to take twice per day for 5 days Continue to use nasal saline flushes along with a humidifier at night to assist with nasal irritation.

## 2021-06-08 NOTE — Assessment & Plan Note (Signed)
Acute, recurrent condition States Flonase, Nettipot and Mucinex have not provided relief Denies systemic symptoms making me more suspicious of acute bacterial sinusitis  Provided Rx for Augmentin to assist with resolution and informed him he can continue with flonase and antihistamine for further relief Recommend he follow up with ENT if this persists.

## 2021-06-08 NOTE — Telephone Encounter (Signed)
Spoke with patient and scheduled a Mychart Palliative Consult for 06/10/21 @ 12:30 PM.   Consent obtained; updated Outlook/Netsmart/Team List and Epic.

## 2021-06-10 ENCOUNTER — Encounter: Payer: Self-pay | Admitting: Nurse Practitioner

## 2021-06-10 ENCOUNTER — Other Ambulatory Visit: Payer: Self-pay

## 2021-06-10 ENCOUNTER — Telehealth: Payer: Medicare Other | Admitting: Nurse Practitioner

## 2021-06-10 DIAGNOSIS — G8929 Other chronic pain: Secondary | ICD-10-CM

## 2021-06-10 DIAGNOSIS — M169 Osteoarthritis of hip, unspecified: Secondary | ICD-10-CM

## 2021-06-10 DIAGNOSIS — Z515 Encounter for palliative care: Secondary | ICD-10-CM

## 2021-06-10 DIAGNOSIS — R5381 Other malaise: Secondary | ICD-10-CM

## 2021-06-10 DIAGNOSIS — I639 Cerebral infarction, unspecified: Secondary | ICD-10-CM | POA: Diagnosis not present

## 2021-06-10 NOTE — Progress Notes (Signed)
[facility] [facility] Palliative Care Consult Note Telephone: [phone]  Fax: [phone]   Date of encounter: 06/10/21 10:21 PM PATIENT NAME: Cameron Foster. [address] [place] [address]   [phone] (home)  DOB: [birth year] MRN: [id number] PRIMARY CARE PROVIDER:    [name], MD,  [address] [place] [address] [phone]  REFERRING PROVIDER:   [name], [place],  [place] [address] [phone]  RESPONSIBLE PARTY:    Contact Information     Name Relation Home Work Mobile   [place] Spouse [phone]  [phone]   Singleton,Lisa Daughter   [phone]      Due to the COVID-19 crisis, this visit was done via telemedicine from my office and it was initiated and consent by this patient and or family.  I connected with  [name] OR PROXY on 06/10/21 by a video enabled telemedicine application and verified that I am speaking with the correct person using two identifiers.   I discussed the limitations of evaluation and management by telemedicine. The patient expressed understanding and agreed to proceed. Palliative Care was asked to follow this patient by consultation request of  Bacigalupo, Dionne Bucy, MD to address advance care planning and complex medical decision making. This is the initial visit.                       ASSESSMENT AND PLAN / RECOMMENDATIONS:  Symptom Management/Plan: 1. Advance Care Planning;  Discussed MOST form, ongoing discussion wishes to revisit with in person visit.   2. Goals of Care: Goals include to maximize quality of life and symptom management. Our advance care planning conversation included a discussion about:    The value and importance of advance care planning  Exploration of personal, cultural or spiritual beliefs that might influence medical decisions  Exploration of goals of care in the event of a sudden injury  or illness  Identification and preparation of a healthcare agent  Review and updating or creation of an advance directive document.  3. Debility secondary to late onset CVA; discussed at length about mobility, ADL's, planning for next phase of his life,   4. Chronic pain due to OA hip with pending sgy for hip with orthopedic which is scheduled for 06/28/2021 total hip arthro. Continue current regimen has been receiving relief  5. Palliative care encounter; Palliative care encounter; Palliative medicine team will continue to support patient, patient's family, and medical team. Visit consisted of counseling and education dealing with the complex and emotionally intense issues of symptom management and palliative care in the setting of serious and potentially life-threatening illness  Follow up Palliative Care Visit: Palliative care will continue to follow for complex medical decision making, advance care planning, and clarification of goals. Return 4 weeks or prn.  I spent 61 minutes providing this consultation. More than 50% of the time in this consultation was spent in counseling and care coordination. PPS: 50%  HOSPICE ELIGIBILITY/DIAGNOSIS: TBD Initial palliative consult for complex medical decision making Chief Complaint: Initial consult for complex medical decision making  HISTORY OF PRESENT ILLNESS:  Cameron Foster. is a 80 y.o. year old male  with multiple medial problems including SAH (2020), CVA (lucunar infarct), atrial fib, HTN, chronic pansinusitis, h/o gastric ulcer with hemorrhage, IBS, h/o h.pylori, IBS, GERD, foraminal stenosis of lumbar region, cervical region, chronic low back pain, HLD, OA left hip. Mr. Decesare and  I connected for telemedicine visit. We talked about currently he is independent with ADL's, lives at home with his wife. We talked about PMH, ROS, symptoms specific pain with pending surgery for hip. We talked abot appetite, nutrition. We talked about family dynamics,  life review. We talked about medical goals including MOST form with revisit at next [facility] visit. We talked about role pc in poc. We talked about f/u PC visit, will schedule in 4 weeks. Mr. Bolander talked at length about fears and concerns for long term care planning, requesting to meet with [facility] SW, will do referral. Therapeutic listening, emotional support provided. Questions answered.   History obtained from review of EMR, discussion with Mr. Dowson.  I reviewed available labs, medications, imaging, studies and related documents from the EMR.  Records reviewed and summarized above.   ROS 10 point system reviewed with Mr Vandermeulen, negative except HPI  Physical Exam: deferred CURRENT PROBLEM LIST:  Patient Active Problem List   Diagnosis Date Noted   Actinic keratosis 05/24/2021   Squamous cell cancer of skin of left forearm 05/24/2021   Chronic gastric ulcer with hemorrhage 02/25/2021   Irregular Z line of esophagus 02/25/2021   Chronic pansinusitis 01/26/2021   Dizziness 01/26/2021   Chronic cough 01/26/2021   Need for influenza vaccination 01/26/2021   Hospital discharge follow-up 01/26/2021   Melena 01/13/2021   Acute blood loss anemia 01/13/2021   Alcohol use disorder, moderate, dependence ([place]) 01/13/2021   Stage 3a chronic kidney disease ([place]) 11/23/2020   Anemia 11/23/2020   Chronic pain of right ankle 05/21/2020   Encounter for attention to colostomy ([place]) 05/21/2020   Achilles tendinitis 04/30/2020   Synovitis and tenosynovitis 04/30/2020   Lymphedema 03/03/2020   Chronic venous insufficiency 03/03/2020   Fatigue 02/14/2020   Leg edema 02/14/2020   Foraminal stenosis of lumbar region 11/08/2019   Chronic bilateral low back pain without sciatica 10/18/2019   Foraminal stenosis of cervical region 10/18/2019   PSVT (paroxysmal supraventricular tachycardia) ([place]) 08/01/2019   Lacunar infarction ([place]) 07/10/2019   History of lacunar cerebrovascular accident (CVA) 07/01/2019    Cerebrovascular accident (CVA) ([place]) 06/08/2019   Paroxysmal atrial fibrillation ([place]) 11/07/2018   TIA (transient ischemic attack) 10/20/2018   History of subarachnoid hemorrhage 10/16/2018   Acute recurrent sinusitis 10/16/2018   Low libido 08/30/2018   Obesity 05/14/2018   Hyperlipidemia LDL goal <70 05/14/2018   Degeneration of lumbar intervertebral disc 03/22/2018   Osteoarthritis of hip 03/22/2018   Trochanteric bursitis of right hip 03/22/2018   History of colostomy reversal 03/21/2017   Status post partial colectomy 01/03/2017   Depression, major, single episode, mild ([place]) 01/03/2017   Vasovagal syncope 01/27/2016   History of skin cancer 10/28/2015   Osteoarthritis of right knee 10/01/2015   Essential hypertension 08/25/2015   Dyspnea on exertion 08/12/2015   Erectile dysfunction following radiation therapy 08/04/2015   Constipation 08/02/2015   History of prostate cancer 01/20/2015   Swelling of right lower extremity 01/20/2015   PAST MEDICAL HISTORY:  Active Ambulatory Problems    Diagnosis Date Noted   History of prostate cancer 01/20/2015   Swelling of right lower extremity 01/20/2015   Constipation 08/02/2015   Erectile dysfunction following radiation therapy 08/04/2015   Dyspnea on exertion 08/12/2015   Essential hypertension 08/25/2015   Osteoarthritis of right knee 10/01/2015   History of skin cancer 10/28/2015   Vasovagal syncope 01/27/2016   Status post partial colectomy 01/03/2017   Depression, major, single episode, mild ([place]) 01/03/2017  History of colostomy reversal 03/21/2017   Degeneration of lumbar intervertebral disc 03/22/2018   Osteoarthritis of hip 03/22/2018   Trochanteric bursitis of right hip 03/22/2018   Obesity 05/14/2018   Hyperlipidemia LDL goal <70 05/14/2018   Low libido 08/30/2018   History of subarachnoid hemorrhage 10/16/2018   Acute recurrent sinusitis 10/16/2018   TIA (transient ischemic attack) 10/20/2018   Paroxysmal  atrial fibrillation ([place]) 11/07/2018   Cerebrovascular accident (CVA) ([place]) 06/08/2019   History of lacunar cerebrovascular accident (CVA) 07/01/2019   PSVT (paroxysmal supraventricular tachycardia) ([place]) 08/01/2019   Chronic bilateral low back pain without sciatica 10/18/2019   Foraminal stenosis of cervical region 10/18/2019   Foraminal stenosis of lumbar region 11/08/2019   Lacunar infarction ([place]) 07/10/2019   Fatigue 02/14/2020   Leg edema 02/14/2020   Lymphedema 03/03/2020   Chronic venous insufficiency 03/03/2020   Chronic pain of right ankle 05/21/2020   Encounter for attention to colostomy ([place]) 05/21/2020   Achilles tendinitis 04/30/2020   Synovitis and tenosynovitis 04/30/2020   Stage 3a chronic kidney disease ([place]) 11/23/2020   Anemia 11/23/2020   Melena 01/13/2021   Acute blood loss anemia 01/13/2021   Alcohol use disorder, moderate, dependence ([place]) 01/13/2021   Chronic pansinusitis 01/26/2021   Dizziness 01/26/2021   Chronic cough 01/26/2021   Need for influenza vaccination 01/26/2021   Hospital discharge follow-up 01/26/2021   Actinic keratosis 05/24/2021   Chronic gastric ulcer with hemorrhage 02/25/2021   Irregular Z line of esophagus 02/25/2021   Squamous cell cancer of skin of left forearm 05/24/2021   Resolved Ambulatory Problems    Diagnosis Date Noted   Skin nodule 01/20/2015   Grief reaction 01/20/2015   Myalgia 08/02/2015   Sinusitis, acute frontal 08/02/2015   Urinary retention 08/03/2015   Gross hematuria 08/04/2015   Diverticulitis of colon 08/07/2015   Chronic sinus infection 08/12/2015   Right leg numbness 08/12/2015   Chronic abdominal pain 08/25/2015   Anxiety 08/25/2015   Rash and nonspecific skin eruption 10/28/2015   Abdominal bloating 01/01/2016   Diverticulitis of large intestine with perforation without abscess or bleeding 01/18/2016   Preoperative clearance 05/16/2016   Visit for wound check 05/16/2016   Rib fracture 06/24/2016    Total knee replacement status 07/27/2016   History of Moh's micrographic surgery for skin cancer 09/07/2016   Diverticulitis 12/05/2016   Generalized abdominal pain 02/16/2016   Diverticulitis of large intestine without perforation or abscess without bleeding    Penile pain 04/19/2017   Leg pain 03/05/2018   Diplopia 11/07/2018   Cervicalgia 01/14/2020   Past Medical History:  Diagnosis Date   Alcoholism ([place])    Arthritis    Atrial fibrillation ([place]) 1995   Basal cell carcinoma 04/26/2017   Basal cell carcinoma 06/11/2019   Basal cell carcinoma 09/09/2019   Basal cell carcinoma 02/05/2020   Carotid arterial disease (HCC)    Chicken pox    Colon cancer (HCC)    Colon polyp    Depression    Diverticulosis 30 years   Diverticulosis    Dysrhythmia    Gastritis    GERD (gastroesophageal reflux disease)    H. pylori infection    History of cerebral hemorrhage    History of echocardiogram    History of stress test    Hyperlipidemia    Hypertension    Irritable bowel syndrome    Prostate cancer ([place]) 2012   Squamous cell carcinoma of skin 07/18/2017   Squamous cell carcinoma of skin 04/24/2018  Squamous cell carcinoma of skin 06/11/2019   Squamous cell carcinoma of skin 01/09/2020   Stroke [facility])    SOCIAL HX:  Social History   Tobacco Use   Smoking status: Former    Packs/day: 1.00    Years: 27.00    Pack years: 27.00    Types: Cigarettes    Quit date: 04/18/1988    Years since quitting: 33.1   Smokeless tobacco: Current    Types: Chew  Substance Use Topics   Alcohol use: Yes    Alcohol/week: 7.0 standard drinks    Types: 7 Glasses of wine per week    Comment: weekly-Formal heavy use ETOH   FAMILY HX:  Family History  Problem Relation Age of Onset   Lung cancer Father        smoker   Other Mother    Sudden death Son        due to "Blood clots"   Bipolar disorder Son    Heart disease Son    Kidney disease Daughter        congenital one small kidney    Prostate cancer Neg Hx    Bladder Cancer Neg Hx       ALLERGIES:  Allergies  Allergen Reactions   Formaldehyde Rash     PERTINENT MEDICATIONS:  Outpatient Encounter Medications as of 06/10/2021  Medication Sig   amLODipine (NORVASC) 5 MG tablet Take 5 mg by mouth daily.   amoxicillin-clavulanate (AUGMENTIN) 875-125 MG tablet Take 1 tablet by mouth 2 (two) times daily for 5 days.   aspirin 81 MG EC tablet Take 81 mg by mouth daily.    atorvastatin (LIPITOR) 40 MG tablet TAKE 1 TABLET BY MOUTH EVERY DAY   furosemide (LASIX) 20 MG tablet TAKE 1 TABLET BY MOUTH EVERY DAY   hydrocortisone 2.5 % cream APPLY TO AFFECTED AREA TWICE A DAY AS NEEDED FOR RASH   lisinopril (ZESTRIL) 40 MG tablet Take 1 tablet (40 mg total) by mouth daily.   meclizine (ANTIVERT) 25 MG tablet Take 25 mg by mouth 2 (two) times daily as needed for dizziness.   metoprolol succinate (TOPROL-XL) 50 MG 24 hr tablet TAKE 1 TABLET BY MOUTH DAILY. TAKE WITH OR IMMEDIATELY FOLLOWING A MEAL.   Multiple Vitamin (MULTIVITAMIN WITH MINERALS) TABS tablet Take 1 tablet by mouth daily. (Patient taking differently: Take 1 tablet by mouth daily. Vitacraves Men's gummies)   pantoprazole (PROTONIX) 40 MG tablet Take 1 tablet (40 mg total) by mouth 2 (two) times daily. To heal ulcers. (Patient taking differently: Take 40 mg by mouth daily. To heal ulcers.)   sertraline (ZOLOFT) 50 MG tablet Take 1 tablet (50 mg total) by mouth daily.   traMADol (ULTRAM) 50 MG tablet Take by mouth every 6 (six) hours as needed.   vitamin B-12 (CYANOCOBALAMIN) 1000 MCG tablet Take 1,000 mcg by mouth daily.   No facility-administered encounter medications on file as of 06/10/2021.   Thank you for the opportunity to participate in the care of Mr. Rumbaugh.  The palliative care team will continue to follow. Please call our office at [phone] if we can be of additional assistance.   Davis Vannatter Z Natividad Schlosser, NP ,

## 2021-06-15 DIAGNOSIS — I10 Essential (primary) hypertension: Secondary | ICD-10-CM

## 2021-06-15 DIAGNOSIS — E785 Hyperlipidemia, unspecified: Secondary | ICD-10-CM

## 2021-06-16 ENCOUNTER — Encounter
Admission: RE | Admit: 2021-06-16 | Discharge: 2021-06-16 | Disposition: A | Discharge status: Home / Self Care | Payer: Medicare Other | Source: Ambulatory Visit | Attending: Orthopedic Surgery | Admitting: Orthopedic Surgery

## 2021-06-16 ENCOUNTER — Encounter: Payer: Self-pay | Admitting: Urgent Care

## 2021-06-16 ENCOUNTER — Other Ambulatory Visit: Payer: Self-pay

## 2021-06-16 VITALS — BP 132/83 | HR 72 | Temp 98.4°F | Resp 14 | Ht 68.0 in | Wt 211.0 lb

## 2021-06-16 DIAGNOSIS — N1831 Chronic kidney disease, stage 3a: Secondary | ICD-10-CM | POA: Diagnosis not present

## 2021-06-16 DIAGNOSIS — Z01818 Encounter for other preprocedural examination: Secondary | ICD-10-CM | POA: Diagnosis not present

## 2021-06-16 DIAGNOSIS — G459 Transient cerebral ischemic attack, unspecified: Secondary | ICD-10-CM | POA: Diagnosis not present

## 2021-06-16 DIAGNOSIS — Z01812 Encounter for preprocedural laboratory examination: Secondary | ICD-10-CM

## 2021-06-16 DIAGNOSIS — I10 Essential (primary) hypertension: Secondary | ICD-10-CM | POA: Diagnosis not present

## 2021-06-16 LAB — TYPE AND SCREEN
ABO/RH(D): O POS
Antibody Screen: NEGATIVE

## 2021-06-16 LAB — URINALYSIS, COMPLETE (UACMP) WITH MICROSCOPIC
Bacteria, UA: NONE SEEN
Bilirubin Urine: NEGATIVE
Glucose, UA: NEGATIVE mg/dL
Hgb urine dipstick: NEGATIVE
Ketones, ur: NEGATIVE mg/dL
Leukocytes,Ua: NEGATIVE
Nitrite: NEGATIVE
Protein, ur: NEGATIVE mg/dL
Specific Gravity, Urine: 1.017 (ref 1.005–1.030)
pH: 5 (ref 5.0–8.0)

## 2021-06-16 LAB — BASIC METABOLIC PANEL
Anion gap: 7 (ref 5–15)
BUN: 16 mg/dL (ref 8–23)
CO2: 28 mmol/L (ref 22–32)
Calcium: 9.4 mg/dL (ref 8.9–10.3)
Chloride: 104 mmol/L (ref 98–111)
Creatinine, Ser: 1.2 mg/dL (ref 0.61–1.24)
GFR, Estimated: >60 mL/min (ref >60)
Glucose, Bld: 103 mg/dL — ABNORMAL HIGH (ref 70–99)
Potassium: 3.9 mmol/L (ref 3.5–5.1)
Sodium: 139 mmol/L (ref 135–145)

## 2021-06-16 LAB — SURGICAL PCR SCREEN
MRSA, PCR: NEGATIVE
Staphylococcus aureus: NEGATIVE

## 2021-06-16 LAB — CBC
HCT: 36.1 % — ABNORMAL LOW (ref 39.0–52.0)
Hemoglobin: 11.3 g/dL — ABNORMAL LOW (ref 13.0–17.0)
MCH: 29.2 pg (ref 26.0–34.0)
MCHC: 31.3 g/dL (ref 30.0–36.0)
MCV: 93.3 fL (ref 80.0–100.0)
Platelets: 297 10*3/uL (ref 150–400)
RBC: 3.87 MIL/uL — ABNORMAL LOW (ref 4.22–5.81)
RDW: 12.9 % (ref 11.5–15.5)
WBC: 7.2 10*3/uL (ref 4.0–10.5)
nRBC: 0 % (ref 0.0–0.2)

## 2021-06-16 NOTE — Patient Instructions (Signed)
Visit Information It was great speaking with you today!  Please let me know if you have any questions about our visit.   Goals Addressed             This Visit's Progress    Manage My Medicine   Not on track    Timeframe:  Long-Range Goal Priority:  High Start Date: 02/25/2021                            Expected End Date: 02/25/2022                      Follow Up within 90 days   - call for medicine refill 2 or 3 days before it runs out - keep a list of all the medicines I take; vitamins and herbals too - use a pillbox to sort medicine    Why is this important?   These steps will help you keep on track with your medicines.   Notes:         Patient Care Plan: General Pharmacy (Adult)     Problem Identified: Hypertension, Hyperlipidemia, Atrial Fibrillation, Chronic Kidney Disease, Depression, Osteoarthritis, and History of TIA, CVA   Priority: High     Long-Range Goal: Patient-Specific Goal   Start Date: 02/25/2021  Expected End Date: 02/25/2022  This Visit's Progress: On track  Recent Progress: On track  Priority: High  Note:   Current Barriers:  Unable to self administer medications as prescribed  Pharmacist Clinical Goal(s):  Patient will maintain control of blood pressure as evidenced by BP less than 140/90  through collaboration with PharmD and provider.   Interventions: 1:1 collaboration with [name], MD regarding development and update of comprehensive plan of care as evidenced by provider attestation and co-signature Inter-disciplinary care team collaboration (see longitudinal plan of care) Comprehensive medication review performed; medication list updated in electronic medical record  Hypertension (BP goal <140/90) -Uncontrolled -Current treatment: Amlodipine 5 mg daily (AM)  Furosemide 20 mg daily (AM)  Lisinopril 40 mg daily (AM)  Metoprolol XL 50 mg daily (AM)  -Medications previously tried: Atenolol, Bisoprolol, nebivolol   -Current home readings: 120/80, 133/83 -Current dietary habits: Eats what I want to eat. 1 cup of coffee. Ocassional sweet tea or regular soda. Eats out large meal once weekly. Does salt food.  -Current exercise habits: None -Denies hypotensive/hypertensive symptoms, but does not significant fatigue throughout the day.  -Query appropriate use of metoprolol in this patient given only one instance of A-Fib > 10 years ago. Could consider discontinuation to see if energy and fatigue symptoms improve.  -Recommended to continue current medication  Hyperlipidemia: (LDL goal < 70) -Uncontrolled -History of TIA, CVA  -Current treatment: Atorvastatin 40 mg daily (AM) -Medications previously tried: NA  -Recommended to continue current medication  Atrial Fibrillation (Goal: prevent stroke and major bleeding) -Controlled -CHADSVASC: 5 -Current treatment: Rate control: Metoprolol XL 50 mg daily  Anticoagulation: None due to history of subarachnoid hemorrhage -Medications previously tried: NA -Only one instance of A-fib documented >10 years ago -Recommended to continue current medication  Depression (Goal: Maintain symptom remission) -Controlled -Current treatment: Sertraline 25 mg daily  -Medications previously tried/failed: NA -PHQ9: 3 -Recommended to continue current medication  Chronic Kidney Disease Stage 3a  -All medications assessed for renal dosing and appropriateness in chronic kidney disease. -Recommended to continue current medication  Patient Goals/Self-Care Activities Patient will:  - check blood pressure  weekly, document, and provide at future appointments  Follow Up Plan: Telephone follow up appointment with care management team member scheduled for:  08/30/2021 at 11:00 AM    Patient agreed to services and verbal consent obtained.   Patient verbalizes understanding of instructions and care plan provided today and agrees to view in [place]. Active MyChart status  confirmed with patient.    Junius Argyle, PharmD, Para [place], CPP  Clinical Pharmacist Practitioner  [facility] [phone]

## 2021-06-16 NOTE — Patient Instructions (Addendum)
Your procedure is scheduled on: Monday, March 13 ?Report to the Registration Desk on the 1st floor of the [facility]. ?To find out your arrival time, please call [phone] between 1PM - 3PM on: Friday, March 10 ? ?REMEMBER: ?Instructions that are not followed completely may result in serious medical risk, up to and including death; or upon the discretion of your surgeon and anesthesiologist your surgery may need to be rescheduled. ? ?Do not eat food after midnight the night before surgery.  ?No gum chewing, lozengers or hard candies. ? ?You may however, drink CLEAR liquids up to 2 hours before you are scheduled to arrive for your surgery. Do not drink anything within 2 hours of your scheduled arrival time. ? ?Clear liquids include: ?- water  ?- apple juice without pulp ?- gatorade (not RED colors) ?- black coffee or tea (Do NOT add milk or creamers to the coffee or tea) ?Do NOT drink anything that is not on this list. ? ?TAKE THESE MEDICATIONS THE MORNING OF SURGERY WITH A SIP OF WATER: ? ?Amlodipine ?Atorvastatin ?Metoprolol ?Pantoprazole - (take one the night before and one on the morning of surgery - helps to prevent nausea after surgery.) ?Sertraline ?Tramadol if needed for pain ? ?One week prior to surgery: starting March 6 ?Stop aspirin and Anti-inflammatories (NSAIDS) such as Advil, Aleve, Ibuprofen, Motrin, Naproxen, Naprosyn and Aspirin based products such as Excedrin, Goodys Powder, BC Powder. ?Stop ANY OVER THE COUNTER supplements until after surgery. Stop multivitamin ?You may however, continue to take Tylenol if needed for pain up until the day of surgery. ? ?No Alcohol for 24 hours before or after surgery. ? ?No Smoking including e-cigarettes for 24 hours prior to surgery.  ?No chewable tobacco products for at least 6 hours prior to surgery.  ?No nicotine patches on the day of surgery. ? ?On the morning of surgery brush your teeth with toothpaste and water, you may rinse your mouth with  mouthwash if you wish. ?Do not swallow any toothpaste or mouthwash. ? ?Use CHG Soap as directed on instruction sheet. ? ?Do not wear jewelry. ? ?Do not wear lotions, powders, or perfumes.  ? ?Do not shave body from the neck down 48 hours prior to surgery just in case you cut yourself which could leave a site for infection.  ?Also, freshly shaved skin may become irritated if using the CHG soap. ? ?Contact lenses, hearing aids and dentures may not be worn into surgery. ? ?Do not bring valuables to the hospital. [facility] is not responsible for any missing/lost belongings or valuables.  ? ?Notify your doctor if there is any change in your medical condition (cold, fever, infection). ? ?Wear comfortable clothing (specific to your surgery type) to the hospital. ? ?After surgery, you can help prevent lung complications by doing breathing exercises.  ?Take deep breaths and cough every 1-2 hours. Your doctor may order a device called an Incentive Spirometer to help you take deep breaths. ? ?If you are being admitted to the hospital overnight, leave your suitcase in the car. ?After surgery it may be brought to your room. ? ?If you are being discharged the day of surgery, you will not be allowed to drive home. ?You will need a responsible adult (18 years or older) to drive you home and stay with you that night.  ? ?If you are taking public transportation, you will need to have a responsible adult (18 years or older) with you. ?Please confirm with your physician that  it is acceptable to use public transportation.  ? ?Please call the [place] Dept. at [phone] if you have any questions about these instructions. ? ?Surgery Visitation Policy: ? ?Patients undergoing a surgery or procedure may have one family member or support person with them as long as that person is not COVID-19 positive or experiencing its symptoms.  ?That person may remain in the waiting area during the procedure and may rotate out with  other people. ? ?Inpatient Visitation:   ? ?Visiting hours are 7 a.m. to 8 p.m. ?Up to two visitors ages 16+ are allowed at one time in a patient room. The visitors may rotate out with other people during the day. Visitors must check out when they leave, or other visitors will not be allowed. One designated support person may remain overnight. ?The visitor must pass COVID-19 screenings, use hand sanitizer when entering and exiting the patient?s room and wear a mask at all times, including in the patient?s room. ?Patients must also wear a mask when staff or their visitor are in the room. ?Masking is required regardless of vaccination status.  ?

## 2021-06-17 ENCOUNTER — Other Ambulatory Visit: Payer: Self-pay | Admitting: Orthopedic Surgery

## 2021-06-17 ENCOUNTER — Encounter: Payer: Self-pay | Admitting: Orthopedic Surgery

## 2021-06-17 DIAGNOSIS — Z01818 Encounter for other preprocedural examination: Secondary | ICD-10-CM

## 2021-06-17 NOTE — H&P (Signed)
NAME: Cameron Foster. ?MRN:   [id number] ?DOB:   1940-06-16 ? ?   HISTORY AND PHYSICAL ? ?CHIEF COMPLAINT:  right hip pain ? ?HISTORY:   Cameron Fosteris a 81 y.o. male  with right  Hip Pain ?Patient complains of right hip pain. Onset of the symptoms was several years ago. Inciting event: known DJD. The patient reports the hip pain is worse with weight bearing. Associated symptoms: none. Aggravating symptoms include: any weight bearing. Patient has had prior hip problems. Previous visits for this problem: multiple, this is a longstanding diagnosis. Last seen several weeks ago by Dr. Harlow Mares . Evaluation to date: plain films, which were abnormal  osteoarthritis . Treatment to date: OTC analgesics, which have been somewhat effective, prescription analgesics, which have been somewhat effective, home exercise program, which has been somewhat effective, and physical therapy, which has been somewhat effective.   .  Plan for right total hip replacement ? ?PAST MEDICAL HISTORY:   ?Past Medical History:  ?Diagnosis Date  ? Alcoholism ([place])   ? Arthritis   ? Atrial fibrillation ([place]) 1995  ? one episode  ? Basal cell carcinoma 04/26/2017  ? Right medial cheek. Superficial and nodular  ? Basal cell carcinoma 06/11/2019  ? Left anterior shoulder. Nodular pattern  ? Basal cell carcinoma 09/09/2019  ? Right nasal ala, EDC  ? Basal cell carcinoma 02/05/2020  ? L upper eyebrow, [facility]   ? Carotid arterial disease ([place])   ? a. 2019-07-11 Carotid U/S: <50% bilat ICA stenoses.  ? Chicken pox   ? Colon cancer ([place])   ? Colon polyp   ? Depression   ? Son died 07-10-2014  ? Diverticulitis   ? Diverticulosis 30 years  ? Diverticulosis   ? Dysrhythmia   ? Gastritis   ? GERD (gastroesophageal reflux disease)   ? H. pylori infection   ? History of cerebral hemorrhage   ? History of echocardiogram   ? a. 07/11/19 Echo: EF 55-60%, no rwma, triv MR/AI.  ? History of stress test   ? a. 09/2015 MV: No ischemia/infarct. EF 45-54% (nl by echo).  ?  Hyperlipidemia   ? Hypertension   ? Irritable bowel syndrome   ? Prostate cancer [facility]) 07/10/2010  ? treated with radiation therapy. Prostate  ? PSVT (paroxysmal supraventricular tachycardia) ([place])   ? a. 06/2019 Zio: Avg rate 74 (54-120), occas PACs, rare PVCs, 125 episodes of PVCs (longest 17.5 secs; max rate 187). No afib.  ? Squamous cell carcinoma of skin 07/18/2017  ? Left medial calf. KA type  ? Squamous cell carcinoma of skin 04/24/2018  ? Right above med. brow  ? Squamous cell carcinoma of skin 06/11/2019  ? Right posterior shoulder. SCCis, hypertrophic  ? Squamous cell carcinoma of skin 01/09/2020  ? Mid nasal dorsum, MOHS, Efudex x 4wks  ? Stroke [facility])   ? Vasovagal syncope   ? ? ?PAST SURGICAL HISTORY:   ?Past Surgical History:  ?Procedure Laterality Date  ? CARDIAC CATHETERIZATION  1998  ? [place] no stents  ? CATARACT EXTRACTION, BILATERAL    ? COLON RESECTION SIGMOID N/A 12/07/2016  ? Procedure: COLON RESECTION SIGMOID;  Surgeon: Clayburn Pert, MD;  Location: [facility] ORS;  Service: General;  Laterality: N/A;  ? COLON SURGERY  11/2016  ? Colostomy  ? COLONOSCOPY  2014/07/10  ? COLONOSCOPY WITH PROPOFOL N/A 05/30/2016  ? Procedure: COLONOSCOPY WITH PROPOFOL;  Surgeon: Lollie Sails, MD;  Location: [facility] ENDOSCOPY;  Service: Endoscopy;  Laterality:  N/A;  ? COLOSTOMY Left 12/07/2016  ? Procedure: COLOSTOMY;  Surgeon: Clayburn Pert, MD;  Location: [facility] ORS;  Service: General;  Laterality: Left;  ? COLOSTOMY REVERSAL N/A 03/21/2017  ? Procedure: COLOSTOMY REVERSAL;  Surgeon: Clayburn Pert, MD;  Location: [facility] ORS;  Service: General;  Laterality: N/A;  ? COLOSTOMY TAKEDOWN N/A 03/21/2017  ? Procedure: LAPAROSCOPIC COLOSTOMY TAKEDOWN;  Surgeon: Clayburn Pert, MD;  Location: [facility] ORS;  Service: General;  Laterality: N/A;  ? CYSTOSCOPY WITH STENT PLACEMENT Bilateral 03/21/2017  ? Procedure: CYSTOSCOPY WITH LIGHTED STENT PLACEMENT;  Surgeon: Abbie Sons, MD;  Location: [facility] ORS;  Service: Urology;   Laterality: Bilateral;  ? ESOPHAGOGASTRODUODENOSCOPY    ? ESOPHAGOGASTRODUODENOSCOPY N/A 05/30/2016  ? Procedure: ESOPHAGOGASTRODUODENOSCOPY (EGD);  Surgeon: Lollie Sails, MD;  Location: [facility] ENDOSCOPY;  Service: Endoscopy;  Laterality: N/A;  ? ESOPHAGOGASTRODUODENOSCOPY N/A 01/14/2021  ? Procedure: ESOPHAGOGASTRODUODENOSCOPY (EGD);  Surgeon: Toledo, Benay Pike, MD;  Location: [facility] ENDOSCOPY;  Service: Gastroenterology;  Laterality: N/A;  ? ESOPHAGOGASTRODUODENOSCOPY (EGD) WITH PROPOFOL N/A 04/28/2021  ? Procedure: ESOPHAGOGASTRODUODENOSCOPY (EGD) WITH PROPOFOL;  Surgeon: Toledo, Benay Pike, MD;  Location: [facility] ENDOSCOPY;  Service: Gastroenterology;  Laterality: N/A;  ? EYE SURGERY    ? cataracts  ? FLEXIBLE SIGMOIDOSCOPY N/A 03/21/2017  ? Procedure: FLEXIBLE SIGMOIDOSCOPY;  Surgeon: Clayburn Pert, MD;  Location: [facility] ORS;  Service: General;  Laterality: N/A;  ? FRACTURE SURGERY Bilateral   ? right arm and left wrist  ? INCISION AND DRAINAGE ABSCESS N/A 12/07/2016  ? Procedure: DRAINAGE  OF INTRA ABDOMINAL ABSCESS;  Surgeon: Clayburn Pert, MD;  Location: [facility] ORS;  Service: General;  Laterality: N/A;  ? LAPAROTOMY N/A 12/07/2016  ? Procedure: EXPLORATORY LAPAROTOMY;  Surgeon: Clayburn Pert, MD;  Location: [facility] ORS;  Service: General;  Laterality: N/A;  ? PROSTATE SURGERY    ? Microwave therapy  ? TONSILLECTOMY    ? TOTAL KNEE ARTHROPLASTY Right 07/27/2016  ? Procedure: TOTAL KNEE ARTHROPLASTY;  Surgeon: [name], MD;  Location: [facility] ORS;  Service: Orthopedics;  Laterality: Right;  Dr. Erlene Quan had to place Urinary catheter due to prostate cancer history.  Using flexible scope.  ? ? ?MEDICATIONS:  (Not in a hospital admission) ? ? ?ALLERGIES:   ?Allergies  ?Allergen Reactions  ? Formaldehyde Rash  ?  From shoes made with this material  ? ? ?REVIEW OF SYSTEMS:   Negative except HPI ? ?FAMILY HISTORY:   ?Family History  ?Problem Relation Age of Onset  ? Lung cancer Father   ?     smoker  ? Other Mother    ? Sudden death Son   ?     due to "Blood clots"  ? Bipolar disorder Son   ? Heart disease Son   ? Kidney disease Daughter   ?     congenital one small kidney  ? Prostate cancer Neg Hx   ? Bladder Cancer Neg Hx   ? ? ?SOCIAL HISTORY:   reports that he quit smoking about 33 years ago. His smoking use included cigarettes. He has a 27.00 pack-year smoking history. His smokeless tobacco use includes chew. He reports current alcohol use of about 7.0 standard drinks per week. He reports that he does not use drugs. ? ?PHYSICAL EXAM:  General appearance: alert, cooperative, and no distress ?Neck: no JVD and supple, symmetrical, trachea midline ?Resp: clear to auscultation bilaterally ?Cardio: regular rate and rhythm, S1, S2 normal, no murmur, click, rub or gallop ?GI: soft, non-tender; bowel sounds normal; no masses,  no organomegaly ?  Extremities: extremities normal, atraumatic, no cyanosis or edema and Homans sign is negative, no sign of DVT ?Pulses: 2+ and symmetric ?Skin: Skin color, texture, turgor normal. No rashes or lesions ?Neurologic: Alert and oriented X 3, normal strength and tone. Normal symmetric reflexes. Normal coordination and gait  ? ? ?LABORATORY STUDIES: ?Recent Labs  ?  06/16/21 ?1555  ?WBC 7.2  ?HGB 11.3*  ?HCT 36.1*  ?PLT 297  ? ?  ?Recent Labs  ?  06/16/21 ?1555  ?NA 139  ?K 3.9  ?CL 104  ?CO2 28  ?GLUCOSE 103*  ?BUN 16  ?CREATININE 1.20  ?CALCIUM 9.4  ? ? ?STUDIES/RESULTS: ? No results found. ? ?ASSESSMENT:  End stage osteoarthritis right hip ?       Active Problems: ?  * No active hospital problems. * ? ?  ?PLAN:  Right Primary Total Hip ? ? ?Carlynn Spry ?06/17/2021. 7:08 AM ? ? ? ?  ? ?

## 2021-06-19 ENCOUNTER — Other Ambulatory Visit: Payer: Self-pay | Admitting: Family Medicine

## 2021-06-19 DIAGNOSIS — I10 Essential (primary) hypertension: Secondary | ICD-10-CM

## 2021-06-21 ENCOUNTER — Encounter: Payer: Self-pay | Admitting: Emergency Medicine

## 2021-06-21 ENCOUNTER — Other Ambulatory Visit: Payer: Self-pay

## 2021-06-21 DIAGNOSIS — Z9049 Acquired absence of other specified parts of digestive tract: Secondary | ICD-10-CM

## 2021-06-21 DIAGNOSIS — Z923 Personal history of irradiation: Secondary | ICD-10-CM

## 2021-06-21 DIAGNOSIS — E785 Hyperlipidemia, unspecified: Secondary | ICD-10-CM | POA: Diagnosis present

## 2021-06-21 DIAGNOSIS — K922 Gastrointestinal hemorrhage, unspecified: Secondary | ICD-10-CM | POA: Diagnosis not present

## 2021-06-21 DIAGNOSIS — Z85038 Personal history of other malignant neoplasm of large intestine: Secondary | ICD-10-CM

## 2021-06-21 DIAGNOSIS — E669 Obesity, unspecified: Secondary | ICD-10-CM | POA: Diagnosis present

## 2021-06-21 DIAGNOSIS — I471 Supraventricular tachycardia: Secondary | ICD-10-CM | POA: Diagnosis present

## 2021-06-21 DIAGNOSIS — F1020 Alcohol dependence, uncomplicated: Secondary | ICD-10-CM | POA: Diagnosis present

## 2021-06-21 DIAGNOSIS — N179 Acute kidney failure, unspecified: Secondary | ICD-10-CM | POA: Diagnosis not present

## 2021-06-21 DIAGNOSIS — I5021 Acute systolic (congestive) heart failure: Secondary | ICD-10-CM | POA: Diagnosis present

## 2021-06-21 DIAGNOSIS — Z888 Allergy status to other drugs, medicaments and biological substances status: Secondary | ICD-10-CM

## 2021-06-21 DIAGNOSIS — A09 Infectious gastroenteritis and colitis, unspecified: Secondary | ICD-10-CM | POA: Diagnosis not present

## 2021-06-21 DIAGNOSIS — Z7901 Long term (current) use of anticoagulants: Secondary | ICD-10-CM

## 2021-06-21 DIAGNOSIS — K219 Gastro-esophageal reflux disease without esophagitis: Secondary | ICD-10-CM | POA: Diagnosis present

## 2021-06-21 DIAGNOSIS — E876 Hypokalemia: Secondary | ICD-10-CM | POA: Diagnosis not present

## 2021-06-21 DIAGNOSIS — I452 Bifascicular block: Secondary | ICD-10-CM | POA: Diagnosis present

## 2021-06-21 DIAGNOSIS — K529 Noninfective gastroenteritis and colitis, unspecified: Secondary | ICD-10-CM | POA: Diagnosis not present

## 2021-06-21 DIAGNOSIS — Z96651 Presence of right artificial knee joint: Secondary | ICD-10-CM | POA: Diagnosis present

## 2021-06-21 DIAGNOSIS — Z85828 Personal history of other malignant neoplasm of skin: Secondary | ICD-10-CM

## 2021-06-21 DIAGNOSIS — K921 Melena: Secondary | ICD-10-CM | POA: Diagnosis not present

## 2021-06-21 DIAGNOSIS — F320 Major depressive disorder, single episode, mild: Secondary | ICD-10-CM | POA: Diagnosis present

## 2021-06-21 DIAGNOSIS — Z01818 Encounter for other preprocedural examination: Secondary | ICD-10-CM | POA: Insufficient documentation

## 2021-06-21 DIAGNOSIS — I110 Hypertensive heart disease with heart failure: Secondary | ICD-10-CM | POA: Diagnosis present

## 2021-06-21 DIAGNOSIS — Z79899 Other long term (current) drug therapy: Secondary | ICD-10-CM

## 2021-06-21 DIAGNOSIS — K633 Ulcer of intestine: Secondary | ICD-10-CM | POA: Diagnosis present

## 2021-06-21 DIAGNOSIS — J42 Unspecified chronic bronchitis: Secondary | ICD-10-CM | POA: Diagnosis present

## 2021-06-21 DIAGNOSIS — K644 Residual hemorrhoidal skin tags: Secondary | ICD-10-CM | POA: Diagnosis present

## 2021-06-21 DIAGNOSIS — I429 Cardiomyopathy, unspecified: Secondary | ICD-10-CM | POA: Diagnosis present

## 2021-06-21 DIAGNOSIS — Z801 Family history of malignant neoplasm of trachea, bronchus and lung: Secondary | ICD-10-CM

## 2021-06-21 DIAGNOSIS — E860 Dehydration: Secondary | ICD-10-CM | POA: Diagnosis not present

## 2021-06-21 DIAGNOSIS — Z86010 Personal history of colonic polyps: Secondary | ICD-10-CM

## 2021-06-21 DIAGNOSIS — Z6830 Body mass index (BMI) 30.0-30.9, adult: Secondary | ICD-10-CM

## 2021-06-21 DIAGNOSIS — K625 Hemorrhage of anus and rectum: Secondary | ICD-10-CM | POA: Diagnosis not present

## 2021-06-21 DIAGNOSIS — K5903 Drug induced constipation: Secondary | ICD-10-CM | POA: Diagnosis present

## 2021-06-21 DIAGNOSIS — Z20822 Contact with and (suspected) exposure to covid-19: Secondary | ICD-10-CM | POA: Diagnosis present

## 2021-06-21 DIAGNOSIS — I440 Atrioventricular block, first degree: Secondary | ICD-10-CM | POA: Diagnosis present

## 2021-06-21 DIAGNOSIS — Z8546 Personal history of malignant neoplasm of prostate: Secondary | ICD-10-CM

## 2021-06-21 DIAGNOSIS — Z8673 Personal history of transient ischemic attack (TIA), and cerebral infarction without residual deficits: Secondary | ICD-10-CM

## 2021-06-21 DIAGNOSIS — I4892 Unspecified atrial flutter: Secondary | ICD-10-CM | POA: Diagnosis present

## 2021-06-21 DIAGNOSIS — K449 Diaphragmatic hernia without obstruction or gangrene: Secondary | ICD-10-CM | POA: Diagnosis not present

## 2021-06-21 DIAGNOSIS — Z87891 Personal history of nicotine dependence: Secondary | ICD-10-CM

## 2021-06-21 DIAGNOSIS — I4819 Other persistent atrial fibrillation: Secondary | ICD-10-CM | POA: Diagnosis present

## 2021-06-21 DIAGNOSIS — Z7982 Long term (current) use of aspirin: Secondary | ICD-10-CM

## 2021-06-21 DIAGNOSIS — R739 Hyperglycemia, unspecified: Secondary | ICD-10-CM | POA: Diagnosis present

## 2021-06-21 DIAGNOSIS — N281 Cyst of kidney, acquired: Secondary | ICD-10-CM | POA: Diagnosis not present

## 2021-06-21 DIAGNOSIS — T402X5A Adverse effect of other opioids, initial encounter: Secondary | ICD-10-CM | POA: Diagnosis present

## 2021-06-21 DIAGNOSIS — D62 Acute posthemorrhagic anemia: Secondary | ICD-10-CM | POA: Diagnosis present

## 2021-06-21 LAB — CBC
HCT: 41.2 % (ref 39.0–52.0)
Hemoglobin: 13.2 g/dL (ref 13.0–17.0)
MCH: 29 pg (ref 26.0–34.0)
MCHC: 32 g/dL (ref 30.0–36.0)
MCV: 90.5 fL (ref 80.0–100.0)
Platelets: 275 10*3/uL (ref 150–400)
RBC: 4.55 MIL/uL (ref 4.22–5.81)
RDW: 12.9 % (ref 11.5–15.5)
WBC: 18.7 10*3/uL — ABNORMAL HIGH (ref 4.0–10.5)
nRBC: 0 % (ref 0.0–0.2)

## 2021-06-21 LAB — COMPREHENSIVE METABOLIC PANEL
ALT: 13 U/L (ref 0–44)
AST: 26 U/L (ref 15–41)
Albumin: 3.9 g/dL (ref 3.5–5.0)
Alkaline Phosphatase: 79 U/L (ref 38–126)
Anion gap: 10 (ref 5–15)
BUN: 17 mg/dL (ref 8–23)
CO2: 24 mmol/L (ref 22–32)
Calcium: 9.6 mg/dL (ref 8.9–10.3)
Chloride: 102 mmol/L (ref 98–111)
Creatinine, Ser: 1.15 mg/dL (ref 0.61–1.24)
GFR, Estimated: >60 mL/min (ref >60)
Glucose, Bld: 175 mg/dL — ABNORMAL HIGH (ref 70–99)
Potassium: 3.8 mmol/L (ref 3.5–5.1)
Sodium: 136 mmol/L (ref 135–145)
Total Bilirubin: 0.7 mg/dL (ref 0.3–1.2)
Total Protein: 7.6 g/dL (ref 6.5–8.1)

## 2021-06-21 NOTE — Telephone Encounter (Signed)
Requested Prescriptions  ?Pending Prescriptions Disp Refills  ?? sertraline (ZOLOFT) 100 MG tablet [Pharmacy Med Name: SERTRALINE HCL 100 MG TABLET] 90 tablet 1  ?  Sig: TAKE 1 TABLET BY MOUTH EVERY DAY  ?  ? Not Delegated - Psychiatry:  Antidepressants - SSRI - sertraline Failed - 06/19/2021 11:51 AM  ?  ?  Failed - This refill cannot be delegated  ?  ?  Passed - AST in normal range and within 360 days  ?  AST  ?Date Value Ref Range Status  ?01/13/2021 19 15 - 41 U/L Final  ?   ?  ?  Passed - ALT in normal range and within 360 days  ?  ALT  ?Date Value Ref Range Status  ?01/13/2021 15 0 - 44 U/L Final  ?   ?  ?  Passed - Completed PHQ-2 or PHQ-9 in the last 360 days  ?  ?  Passed - Valid encounter within last 6 months  ?  Recent Outpatient Visits   ?      ? 1 week ago Acute recurrent sinusitis, unspecified location  ? [place], PA-C  ? 4 months ago Hospital discharge follow-up  ? [facility] Gwyneth Sprout, FNP  ? 7 months ago Depression, major, single episode, mild ([place])  ? [place], Dionne Bucy, MD  ? 9 months ago Fatigue, unspecified type  ? [place], PA-C  ? 1 year ago Essential hypertension  ? [facility] [place], Dionne Bucy, MD  ?  ?  ?Future Appointments   ?        ? In 2 weeks Mecum, Dani Gobble, PA-C [employer] [employer], [facility]  ? In 2 months [name], MD [place]  ?  ? ?  ?  ?  ?? lisinopril (ZESTRIL) 40 MG tablet [Pharmacy Med Name: LISINOPRIL 40 MG TABLET] 90 tablet 1  ?  Sig: TAKE 1 TABLET BY MOUTH EVERY DAY  ?  ? Cardiovascular:  ACE Inhibitors Passed - 06/19/2021 11:51 AM  ?  ?  Passed - Cr in normal range and within 180 days  ?  Creat  ?Date Value Ref Range Status  ?08/12/2015 1.40 (H) 0.70 - 1.18 mg/dL Final  ? ?Creatinine, Ser  ?Date Value Ref Range Status  ?06/16/2021 1.20 0.61 - 1.24 mg/dL Final  ?   ?  ?  Passed - K in normal range and within 180 days  ?   Potassium  ?Date Value Ref Range Status  ?06/16/2021 3.9 3.5 - 5.1 mmol/L Final  ?   ?  ?  Passed - Patient is not pregnant  ?  ?  Passed - Last BP in normal range  ?  BP Readings from Last 1 Encounters:  ?06/16/21 132/83  ?   ?  ?  Passed - Valid encounter within last 6 months  ?  Recent Outpatient Visits   ?      ? 1 week ago Acute recurrent sinusitis, unspecified location  ? [place], PA-C  ? 4 months ago Hospital discharge follow-up  ? [facility] Gwyneth Sprout, FNP  ? 7 months ago Depression, major, single episode, mild ([place])  ? [facility] [place], Dionne Bucy, MD  ? 9 months ago Fatigue, unspecified type  ? [place], PA-C  ? 1 year ago Essential hypertension  ? [facility] [place], Dionne Bucy, MD  ?  ?  ?Future Appointments   ?        ? In 2 weeks Mecum, Dani Gobble, PA-C [employer], [facility]  ? In 2 months [name], MD [place]  ?  ? ?  ?  ?  ?? sertraline (ZOLOFT) 25 MG tablet [Pharmacy Med Name: SERTRALINE HCL 25 MG TABLET] 90 tablet 1  ?  Sig: TAKE 1 TABLET (25 MG TOTAL) BY MOUTH DAILY.  ?  ? Not Delegated - Psychiatry:  Antidepressants - SSRI - sertraline Failed - 06/19/2021 11:51 AM  ?  ?  Failed - This refill cannot be delegated  ?  ?  Passed - AST in normal range and within 360 days  ?  AST  ?Date Value Ref Range Status  ?01/13/2021 19 15 - 41 U/L Final  ?   ?  ?  Passed - ALT in normal range and within 360 days  ?  ALT  ?Date Value Ref Range Status  ?01/13/2021 15 0 - 44 U/L Final  ?   ?  ?  Passed - Completed PHQ-2 or PHQ-9 in the last 360 days  ?  ?  Passed - Valid encounter within last 6 months  ?  Recent Outpatient Visits   ?      ? 1 week ago Acute recurrent sinusitis, unspecified location  ? [place], PA-C  ? 4 months ago Hospital discharge follow-up  ? [facility] Gwyneth Sprout, FNP  ? 7 months ago Depression,  major, single episode, mild ([place])  ? [facility] [place], Dionne Bucy, MD  ? 9 months ago Fatigue, unspecified type  ? [place], PA-C  ? 1 year ago Essential hypertension  ? [facility] [place], Dionne Bucy, MD  ?  ?  ?Future Appointments   ?        ? In 2 weeks Mecum, Dani Gobble, PA-C [employer], [facility]  ? In 2 months [name], MD [place]  ?  ? ?  ?  ?  ?? amLODipine (NORVASC) 5 MG tablet [Pharmacy Med Name: AMLODIPINE BESYLATE 5 MG TAB] 90 tablet 1  ?  Sig: TAKE 1 TABLET BY MOUTH EVERY DAY  ?  ? Cardiovascular: Calcium Channel Blockers 2 Passed - 06/19/2021 11:51 AM  ?  ?  Passed - Last BP in normal range  ?  BP Readings from Last 1 Encounters:  ?06/16/21 132/83  ?   ?  ?  Passed - Last Heart Rate in normal range  ?  Pulse Readings from Last 1 Encounters:  ?06/16/21 72  ?   ?  ?  Passed - Valid encounter within last 6 months  ?  Recent Outpatient Visits   ?      ? 1 week ago Acute recurrent sinusitis, unspecified location  ? [place], PA-C  ? 4 months ago Hospital discharge follow-up  ? [facility] Gwyneth Sprout, FNP  ? 7 months ago Depression, major, single episode, mild ([place])  ? [facility] [place], Dionne Bucy, MD  ? 9 months ago Fatigue, unspecified type  ? [place], PA-C  ? 1 year ago Essential hypertension  ? [facility] [place], Dionne Bucy, MD  ?  ?  ?Future Appointments   ?        ?  In 2 weeks Mecum, Dani Gobble, PA-C [employer], [facility]  ? In 2 months [name], MD [place]  ?  ? ?  ?  ?  ?? metoprolol succinate (TOPROL-XL) 50 MG 24 hr tablet [Pharmacy Med Name: METOPROLOL SUCC ER 50 MG TAB] 90 tablet 1  ?  Sig: TAKE 1 TABLET BY MOUTH EVERY DAY WITH OR IMMEDIATELY FOLLOWING A MEAL  ?  ? Cardiovascular:  Beta Blockers Passed - 06/19/2021 11:51 AM  ?  ?  Passed - Last BP in normal  range  ?  BP Readings from Last 1 Encounters:  ?06/16/21 132/83  ?   ?  ?  Passed - Last Heart Rate in normal range  ?  Pulse Readings from Last 1 Encounters:  ?06/16/21 72  ?   ?  ?  Passed - Valid encounter within last 6 months  ?  Recent Outpatient Visits   ?      ? 1 week ago Acute recurrent sinusitis, unspecified location  ? [place], PA-C  ? 4 months ago Hospital discharge follow-up  ? [facility] Gwyneth Sprout, FNP  ? 7 months ago Depression, major, single episode, mild ([place])  ? [facility] [place], Dionne Bucy, MD  ? 9 months ago Fatigue, unspecified type  ? [place], PA-C  ? 1 year ago Essential hypertension  ? [facility] [place], Dionne Bucy, MD  ?  ?  ?Future Appointments   ?        ? In 2 weeks Mecum, Dani Gobble, PA-C [employer], [facility]  ? In 2 months [name], MD [place]  ?  ? ?  ?  ?  ? ?

## 2021-06-21 NOTE — ED Triage Notes (Signed)
Pt reports that he ate today and his stomach started hurting, he became constipated, he reports that he kept pushing to make himself go and then became rectal bleeding. It is bright red bleeding.  ?

## 2021-06-21 NOTE — Telephone Encounter (Signed)
Requested medications are due for refill today.  unsure ? ?Requested medications are on the active medications list.  yes ? ?Last refill. Zoloft was changed to 39m.  Per ov of 11/23/2021 pt was to reduce dose and d/c in 4 weeks. Amlodipine was was historical med refilled 03/04/2021 ? ?Future visit scheduled.   yes ? ?Notes to clinic.  Please review for refill. ? ? ? ?Requested Prescriptions  ?Pending Prescriptions Disp Refills  ? sertraline (ZOLOFT) 100 MG tablet [Pharmacy Med Name: SERTRALINE HCL 100 MG TABLET] 90 tablet 1  ?  Sig: TAKE 1 TABLET BY MOUTH EVERY DAY  ?  ? Not Delegated - Psychiatry:  Antidepressants - SSRI - sertraline Failed - 06/19/2021 11:51 AM  ?  ?  Failed - This refill cannot be delegated  ?  ?  Passed - AST in normal range and within 360 days  ?  AST  ?Date Value Ref Range Status  ?01/13/2021 19 15 - 41 U/L Final  ?  ?  ?  ?  Passed - ALT in normal range and within 360 days  ?  ALT  ?Date Value Ref Range Status  ?01/13/2021 15 0 - 44 U/L Final  ?  ?  ?  ?  Passed - Completed PHQ-2 or PHQ-9 in the last 360 days  ?  ?  Passed - Valid encounter within last 6 months  ?  Recent Outpatient Visits   ? ?      ? 1 week ago Acute recurrent sinusitis, unspecified location  ? [place] PA-C  ? 4 months ago Hospital discharge follow-up  ? [facility]  ? 7 months ago Depression, major, single episode, mild ([place]  ? [facility]  ? 9 months ago Fatigue, unspecified type  ? B[place] PA-C  ? 1 year ago Essential hypertension  ? [facility]ionne Bucy MD  ? ?  ?  ?Future Appointments   ? ?        ? In 2 weeks Mecum, EDani Gobble PA-C [facility]  ? In 2 months SBrendolyn Patty MD [place] ? ?  ? ?  ?  ?  ? sertraline (ZOLOFT) 25 MG tablet [Pharmacy Med Name: SERTRALINE HCL 25 MG TABLET] 90 tablet 1  ?  Sig: TAKE 1 TABLET  (25 MG TOTAL) BY MOUTH DAILY.  ?  ? Not Delegated - Psychiatry:  Antidepressants - SSRI - sertraline Failed - 06/19/2021 11:51 AM  ?  ?  Failed - This refill cannot be delegated  ?  ?  Passed - AST in normal range and within 360 days  ?  AST  ?Date Value Ref Range Status  ?01/13/2021 19 15 - 41 U/L Final  ?  ?  ?  ?  Passed - ALT in normal range and within 360 days  ?  ALT  ?Date Value Ref Range Status  ?01/13/2021 15 0 - 44 U/L Final  ?  ?  ?  ?  Passed - Completed PHQ-2 or PHQ-9 in the last 360 days  ?  ?  Passed - Valid encounter within last 6 months  ?  Recent Outpatient Visits   ? ?      ? 1 week ago Acute recurrent sinusitis, unspecified location  ? BCIG[facility] EDani Gobble PA-C  ? 4 months ago Hospital discharge  follow-up  ? [facility] Gwyneth Sprout, FNP  ? 7 months ago Depression, major, single episode, mild ([place])  ? [facility] [place], Dionne Bucy, MD  ? 9 months ago Fatigue, unspecified type  ? [place], PA-C  ? 1 year ago Essential hypertension  ? [facility] Bacigalupo, Dionne Bucy, MD  ? ?  ?  ?Future Appointments   ? ?        ? In 2 weeks Mecum, Dani Gobble, PA-C [employer], [facility]  ? In 2 months [name], MD [place]  ? ?  ? ?  ?  ?  ? amLODipine (NORVASC) 5 MG tablet [Pharmacy Med Name: AMLODIPINE BESYLATE 5 MG TAB] 90 tablet 1  ?  Sig: TAKE 1 TABLET BY MOUTH EVERY DAY  ?  ? Cardiovascular: Calcium Channel Blockers 2 Passed - 06/19/2021 11:51 AM  ?  ?  Passed - Last BP in normal range  ?  BP Readings from Last 1 Encounters:  ?06/16/21 132/83  ?  ?  ?  ?  Passed - Last Heart Rate in normal range  ?  Pulse Readings from Last 1 Encounters:  ?06/16/21 72  ?  ?  ?  ?  Passed - Valid encounter within last 6 months  ?  Recent Outpatient Visits   ? ?      ? 1 week ago Acute recurrent sinusitis, unspecified location  ? [place], PA-C  ? 4 months ago Hospital  discharge follow-up  ? [facility] Gwyneth Sprout, FNP  ? 7 months ago Depression, major, single episode, mild ([place])  ? [facility] [place], Dionne Bucy, MD  ? 9 months ago Fatigue, unspecified type  ? [place], PA-C  ? 1 year ago Essential hypertension  ? [facility] Bacigalupo, Dionne Bucy, MD  ? ?  ?  ?Future Appointments   ? ?        ? In 2 weeks Mecum, Dani Gobble, PA-C [employer], [facility]  ? In 2 months [name], MD [place]  ? ?  ? ?  ?  ?  ?Signed Prescriptions Disp Refills  ? lisinopril (ZESTRIL) 40 MG tablet 90 tablet 0  ?  Sig: TAKE 1 TABLET BY MOUTH EVERY DAY  ?  ? Cardiovascular:  ACE Inhibitors Passed - 06/19/2021 11:51 AM  ?  ?  Passed - Cr in normal range and within 180 days  ?  Creat  ?Date Value Ref Range Status  ?08/12/2015 1.40 (H) 0.70 - 1.18 mg/dL Final  ? ?Creatinine, Ser  ?Date Value Ref Range Status  ?06/16/2021 1.20 0.61 - 1.24 mg/dL Final  ?  ?  ?  ?  Passed - K in normal range and within 180 days  ?  Potassium  ?Date Value Ref Range Status  ?06/16/2021 3.9 3.5 - 5.1 mmol/L Final  ?  ?  ?  ?  Passed - Patient is not pregnant  ?  ?  Passed - Last BP in normal range  ?  BP Readings from Last 1 Encounters:  ?06/16/21 132/83  ?  ?  ?  ?  Passed - Valid encounter within last 6 months  ?  Recent Outpatient Visits   ? ?      ? 1 week ago Acute recurrent sinusitis, unspecified location  ? [employer], Dani Gobble,  PA-C  ? 4 months ago Hospital discharge follow-up  ? [facility] Gwyneth Sprout, FNP  ? 7 months ago Depression, major, single episode, mild ([place])  ? [facility], Dionne Bucy, MD  ? 9 months ago Fatigue, unspecified type  ? [place], PA-C  ? 1 year ago Essential hypertension  ? [facility] Bac[place]alupo, Dionne Bucy, MD  ? ?  ?  ?Future Appointments   ? ?        ? In 2 weeks Mecum, Dani Gobble,  PA-C [employer], [facility]  ? In 2 months [name], MD [place]  ? ?  ? ?  ?  ?  ?Refused Prescriptions Disp Refills  ? metoprolol succinate (TOPROL-XL) 50 MG 24 hr tablet [Pharmacy Med Name: METOPROLOL SUCC ER 50 MG TAB] 90 tablet 1  ?  Sig: TAKE 1 TABLET BY MOUTH EVERY DAY WITH OR IMMEDIATELY FOLLOWING A MEAL  ?  ? Cardiovascular:  Beta Blockers Passed - 06/19/2021 11:51 AM  ?  ?  Passed - Last BP in normal range  ?  BP Readings from Last 1 Encounters:  ?06/16/21 132/83  ?  ?  ?  ?  Passed - Last Heart Rate in normal range  ?  Pulse Readings from Last 1 Encounters:  ?06/16/21 72  ?  ?  ?  ?  Passed - Valid encounter within last 6 months  ?  Recent Outpatient Visits   ? ?      ? 1 week ago Acute recurrent sinusitis, unspecified location  ? [place], PA-C  ? 4 months ago Hospital discharge follow-up  ? [facility] Gwyneth Sprout, FNP  ? 7 months ago Depression, major, single episode, mild ([place])  ? [facility] [place], Dionne Bucy, MD  ? 9 months ago Fatigue, unspecified type  ? [place], PA-C  ? 1 year ago Essential hypertension  ? [facility] Bacigalupo, Dionne Bucy, MD  ? ?  ?  ?Future Appointments   ? ?        ? In 2 weeks Mecum, Dani Gobble, PA-C [employer], [facility]  ? In 2 months [name], MD [place]  ? ?  ? ?  ?  ?  ?  ?

## 2021-06-22 ENCOUNTER — Emergency Department: Payer: Medicare Other

## 2021-06-22 ENCOUNTER — Other Ambulatory Visit: Payer: Self-pay

## 2021-06-22 ENCOUNTER — Inpatient Hospital Stay
Admission: EM | Admit: 2021-06-22 | Discharge: 2021-07-03 | DRG: 391 | Disposition: A | Discharge status: Home / Self Care | Payer: Medicare Other | Attending: Student | Admitting: Student

## 2021-06-22 DIAGNOSIS — K5150 Left sided colitis without complications: Secondary | ICD-10-CM | POA: Diagnosis present

## 2021-06-22 DIAGNOSIS — K921 Melena: Secondary | ICD-10-CM | POA: Diagnosis not present

## 2021-06-22 DIAGNOSIS — I110 Hypertensive heart disease with heart failure: Secondary | ICD-10-CM | POA: Diagnosis not present

## 2021-06-22 DIAGNOSIS — E876 Hypokalemia: Secondary | ICD-10-CM | POA: Diagnosis not present

## 2021-06-22 DIAGNOSIS — R0789 Other chest pain: Secondary | ICD-10-CM | POA: Diagnosis not present

## 2021-06-22 DIAGNOSIS — Z20822 Contact with and (suspected) exposure to covid-19: Secondary | ICD-10-CM | POA: Diagnosis present

## 2021-06-22 DIAGNOSIS — K633 Ulcer of intestine: Secondary | ICD-10-CM | POA: Diagnosis present

## 2021-06-22 DIAGNOSIS — F1020 Alcohol dependence, uncomplicated: Secondary | ICD-10-CM | POA: Diagnosis present

## 2021-06-22 DIAGNOSIS — K529 Noninfective gastroenteritis and colitis, unspecified: Secondary | ICD-10-CM | POA: Diagnosis not present

## 2021-06-22 DIAGNOSIS — K5900 Constipation, unspecified: Secondary | ICD-10-CM | POA: Diagnosis present

## 2021-06-22 DIAGNOSIS — K5289 Other specified noninfective gastroenteritis and colitis: Secondary | ICD-10-CM | POA: Diagnosis not present

## 2021-06-22 DIAGNOSIS — R079 Chest pain, unspecified: Secondary | ICD-10-CM | POA: Diagnosis not present

## 2021-06-22 DIAGNOSIS — I700 Atherosclerosis of aorta: Secondary | ICD-10-CM | POA: Diagnosis not present

## 2021-06-22 DIAGNOSIS — I480 Paroxysmal atrial fibrillation: Secondary | ICD-10-CM | POA: Diagnosis not present

## 2021-06-22 DIAGNOSIS — E860 Dehydration: Secondary | ICD-10-CM | POA: Diagnosis not present

## 2021-06-22 DIAGNOSIS — M169 Osteoarthritis of hip, unspecified: Secondary | ICD-10-CM | POA: Diagnosis present

## 2021-06-22 DIAGNOSIS — I4819 Other persistent atrial fibrillation: Secondary | ICD-10-CM | POA: Diagnosis not present

## 2021-06-22 DIAGNOSIS — N179 Acute kidney failure, unspecified: Secondary | ICD-10-CM | POA: Diagnosis not present

## 2021-06-22 DIAGNOSIS — D62 Acute posthemorrhagic anemia: Secondary | ICD-10-CM | POA: Diagnosis not present

## 2021-06-22 DIAGNOSIS — R739 Hyperglycemia, unspecified: Secondary | ICD-10-CM | POA: Diagnosis present

## 2021-06-22 DIAGNOSIS — I5031 Acute diastolic (congestive) heart failure: Secondary | ICD-10-CM

## 2021-06-22 DIAGNOSIS — E669 Obesity, unspecified: Secondary | ICD-10-CM | POA: Diagnosis present

## 2021-06-22 DIAGNOSIS — E785 Hyperlipidemia, unspecified: Secondary | ICD-10-CM | POA: Diagnosis not present

## 2021-06-22 DIAGNOSIS — K922 Gastrointestinal hemorrhage, unspecified: Secondary | ICD-10-CM | POA: Diagnosis not present

## 2021-06-22 DIAGNOSIS — I5021 Acute systolic (congestive) heart failure: Secondary | ICD-10-CM | POA: Diagnosis not present

## 2021-06-22 DIAGNOSIS — K219 Gastro-esophageal reflux disease without esophagitis: Secondary | ICD-10-CM | POA: Diagnosis present

## 2021-06-22 DIAGNOSIS — K449 Diaphragmatic hernia without obstruction or gangrene: Secondary | ICD-10-CM | POA: Diagnosis not present

## 2021-06-22 DIAGNOSIS — A09 Infectious gastroenteritis and colitis, unspecified: Secondary | ICD-10-CM | POA: Diagnosis present

## 2021-06-22 DIAGNOSIS — I4892 Unspecified atrial flutter: Secondary | ICD-10-CM | POA: Diagnosis present

## 2021-06-22 DIAGNOSIS — F320 Major depressive disorder, single episode, mild: Secondary | ICD-10-CM | POA: Diagnosis present

## 2021-06-22 DIAGNOSIS — I4891 Unspecified atrial fibrillation: Secondary | ICD-10-CM

## 2021-06-22 DIAGNOSIS — T402X5A Adverse effect of other opioids, initial encounter: Secondary | ICD-10-CM | POA: Diagnosis present

## 2021-06-22 DIAGNOSIS — K5903 Drug induced constipation: Secondary | ICD-10-CM | POA: Diagnosis present

## 2021-06-22 DIAGNOSIS — Z136 Encounter for screening for cardiovascular disorders: Secondary | ICD-10-CM | POA: Diagnosis not present

## 2021-06-22 DIAGNOSIS — K644 Residual hemorrhoidal skin tags: Secondary | ICD-10-CM | POA: Diagnosis present

## 2021-06-22 DIAGNOSIS — D6489 Other specified anemias: Secondary | ICD-10-CM | POA: Diagnosis not present

## 2021-06-22 DIAGNOSIS — K625 Hemorrhage of anus and rectum: Secondary | ICD-10-CM

## 2021-06-22 DIAGNOSIS — Z6830 Body mass index (BMI) 30.0-30.9, adult: Secondary | ICD-10-CM | POA: Diagnosis not present

## 2021-06-22 DIAGNOSIS — I5023 Acute on chronic systolic (congestive) heart failure: Secondary | ICD-10-CM | POA: Clinically undetermined

## 2021-06-22 DIAGNOSIS — I10 Essential (primary) hypertension: Secondary | ICD-10-CM | POA: Diagnosis present

## 2021-06-22 DIAGNOSIS — N281 Cyst of kidney, acquired: Secondary | ICD-10-CM | POA: Diagnosis not present

## 2021-06-22 DIAGNOSIS — K559 Vascular disorder of intestine, unspecified: Secondary | ICD-10-CM | POA: Diagnosis not present

## 2021-06-22 DIAGNOSIS — I429 Cardiomyopathy, unspecified: Secondary | ICD-10-CM | POA: Diagnosis present

## 2021-06-22 DIAGNOSIS — I420 Dilated cardiomyopathy: Secondary | ICD-10-CM | POA: Diagnosis not present

## 2021-06-22 DIAGNOSIS — J9811 Atelectasis: Secondary | ICD-10-CM | POA: Diagnosis not present

## 2021-06-22 DIAGNOSIS — I471 Supraventricular tachycardia: Secondary | ICD-10-CM | POA: Diagnosis present

## 2021-06-22 DIAGNOSIS — J42 Unspecified chronic bronchitis: Secondary | ICD-10-CM | POA: Diagnosis present

## 2021-06-22 DIAGNOSIS — I452 Bifascicular block: Secondary | ICD-10-CM | POA: Diagnosis present

## 2021-06-22 DIAGNOSIS — I440 Atrioventricular block, first degree: Secondary | ICD-10-CM | POA: Diagnosis present

## 2021-06-22 DIAGNOSIS — I509 Heart failure, unspecified: Secondary | ICD-10-CM | POA: Diagnosis not present

## 2021-06-22 DIAGNOSIS — K51511 Left sided colitis with rectal bleeding: Secondary | ICD-10-CM | POA: Diagnosis not present

## 2021-06-22 LAB — GASTROINTESTINAL PANEL BY PCR, STOOL (REPLACES STOOL CULTURE)

## 2021-06-22 LAB — TYPE AND SCREEN
ABO/RH(D): O POS
Antibody Screen: NEGATIVE

## 2021-06-22 LAB — RESP PANEL BY RT-PCR (FLU A&B, COVID) ARPGX2
Influenza A by PCR: NEGATIVE
Influenza B by PCR: NEGATIVE
SARS Coronavirus 2 by RT PCR: NEGATIVE

## 2021-06-22 LAB — HEMOGLOBIN AND HEMATOCRIT, BLOOD
HCT: 32.6 % — ABNORMAL LOW (ref 39.0–52.0)
HCT: 35.7 % — ABNORMAL LOW (ref 39.0–52.0)
Hemoglobin: 10.4 g/dL — ABNORMAL LOW (ref 13.0–17.0)
Hemoglobin: 11 g/dL — ABNORMAL LOW (ref 13.0–17.0)

## 2021-06-22 LAB — LACTIC ACID, PLASMA
Lactic Acid, Venous: 1.1 mmol/L (ref 0.5–1.9)
Lactic Acid, Venous: 1.2 mmol/L (ref 0.5–1.9)

## 2021-06-22 IMAGING — CT CT CTA ABD/PEL W/CM AND/OR W/O CM
2 of 17 series · 9 of 46 positions shown, 15 images · IV contrast (APPLIED)
Comparison: Abdomen and pelvis CTs both with contrast, [DATE]
and [DATE]

CLINICAL DATA: Lower GI bleed. Previous history of sigmoid
colectomy and primary anastomosis.

EXAM:
CTA ABDOMEN AND PELVIS WITHOUT AND WITH CONTRAST
TECHNIQUE: Multidetector CT imaging of the abdomen and pelvis was performed
using the standard protocol during bolus administration of
intravenous contrast. Multiplanar reconstructed images and MIPs were
obtained and reviewed to evaluate the vascular anatomy.

[Series 12: cor · coronal · 0.86mm/px · 1 of 137 slices shown, 2 images]
[im 69/137  soft-tissue]
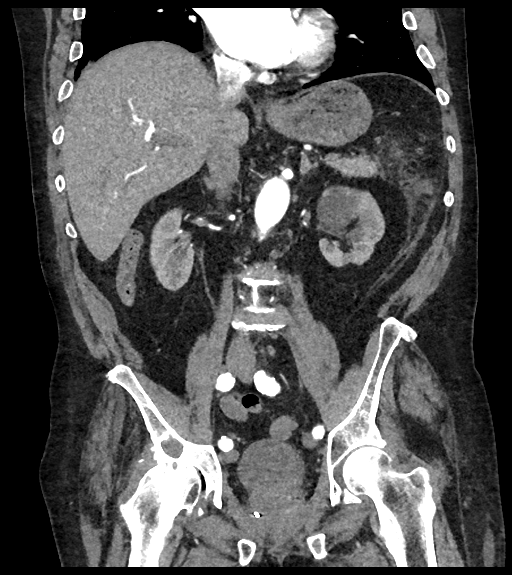
[im 69/137  bone]
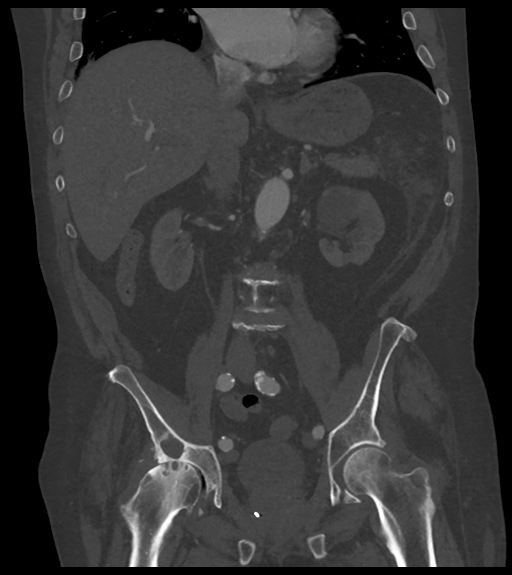

[Series 17: venous thins · axial · portal-venous · 0.82mm/px · z∈[-994,-546]mm · 8 of 1399 slices shown, 13 images]
[im 140/1399  soft-tissue]
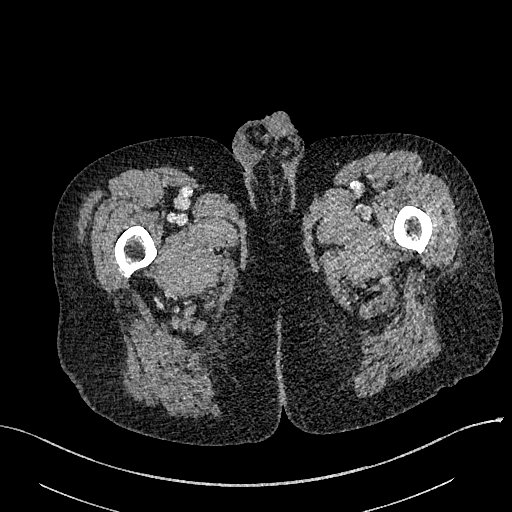
[im 140/1399  bone]
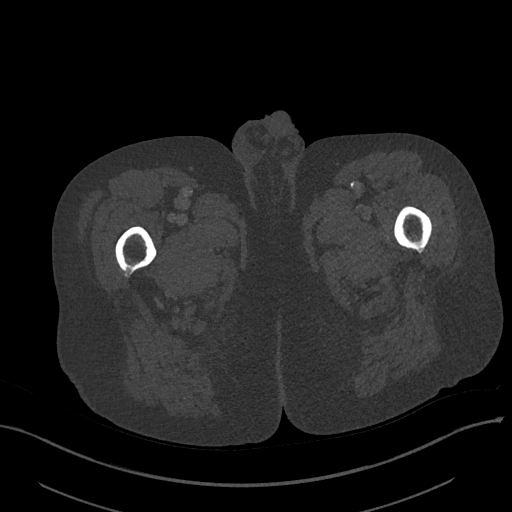
[im 280/1399  soft-tissue]
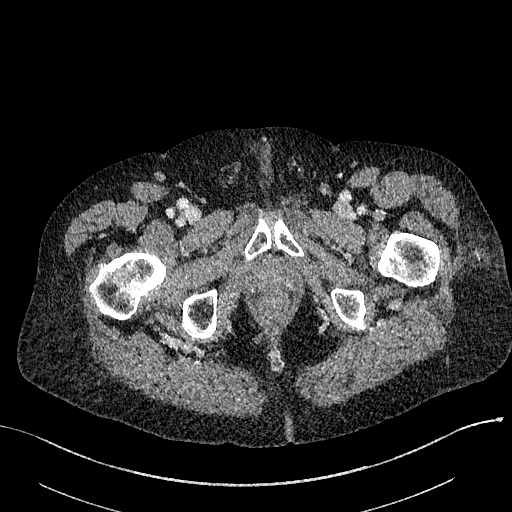
[im 420/1399  soft-tissue]
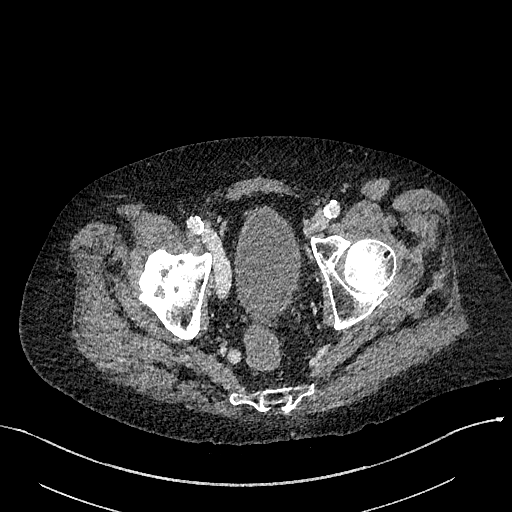
[im 560/1399  soft-tissue]
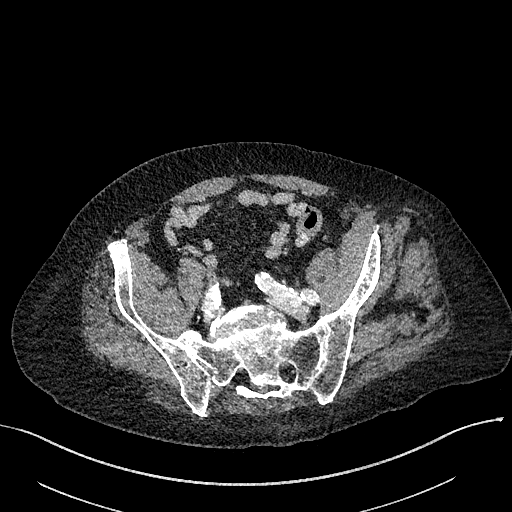
[im 839/1399  soft-tissue]
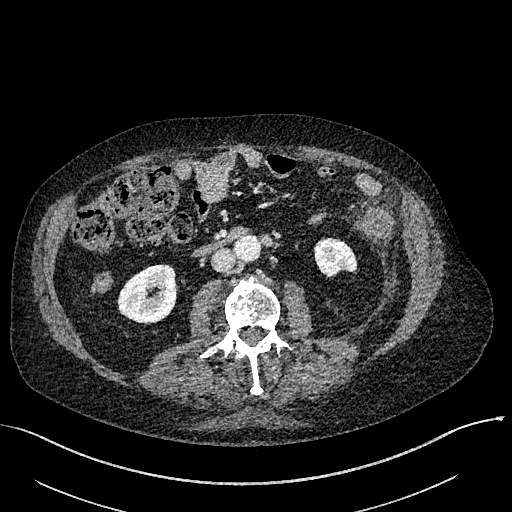
[im 839/1399  lung]
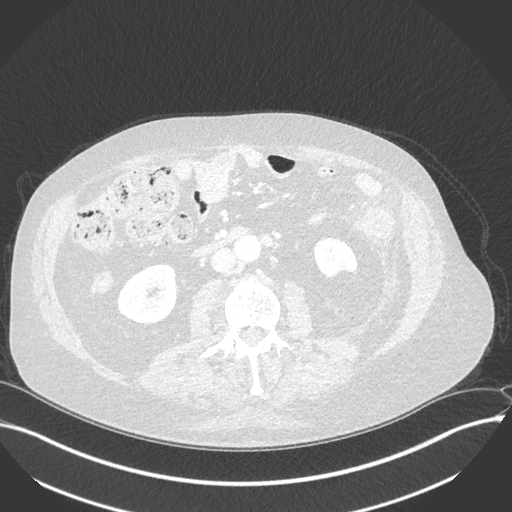
[im 979/1399  soft-tissue]
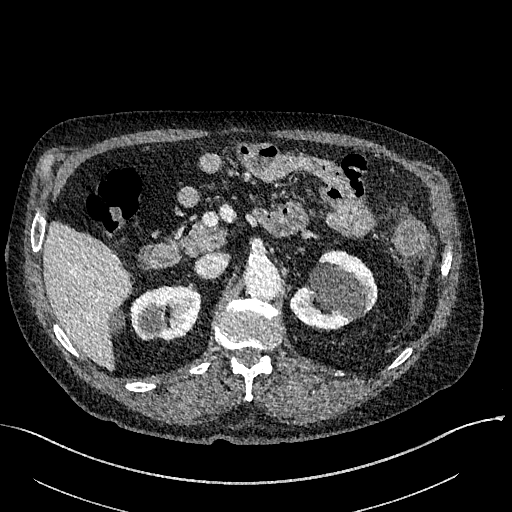
[im 979/1399  lung]
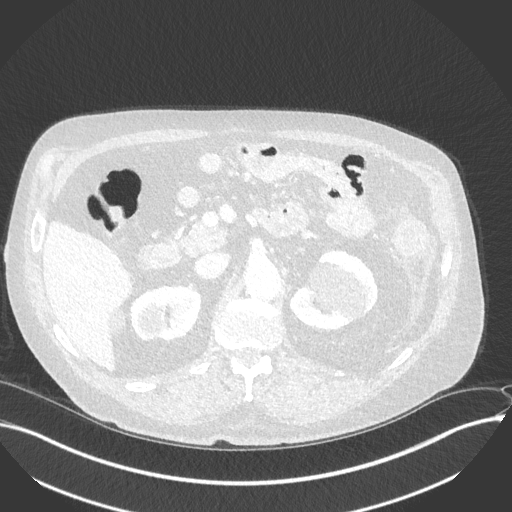
[im 1119/1399  soft-tissue]
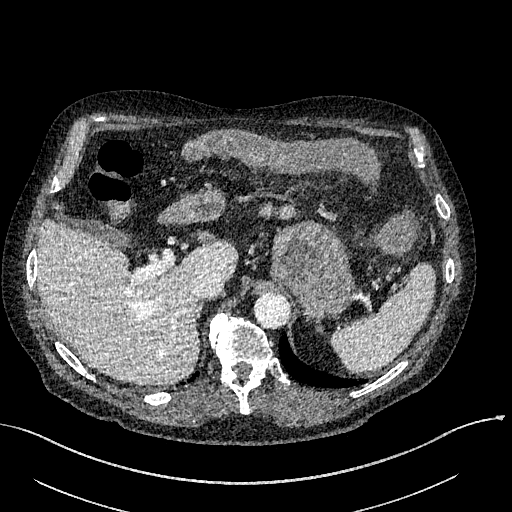
[im 1119/1399  lung]
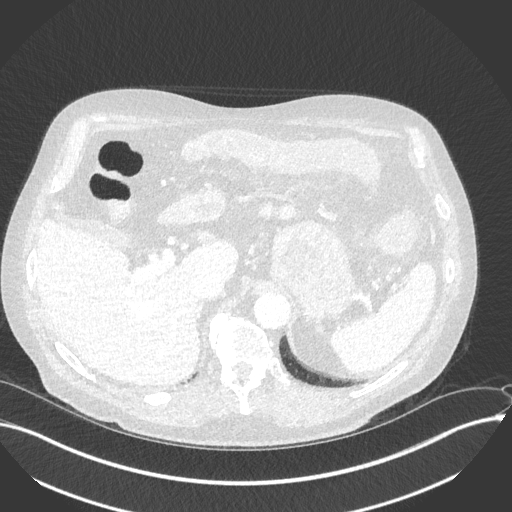
[im 1259/1399  soft-tissue]
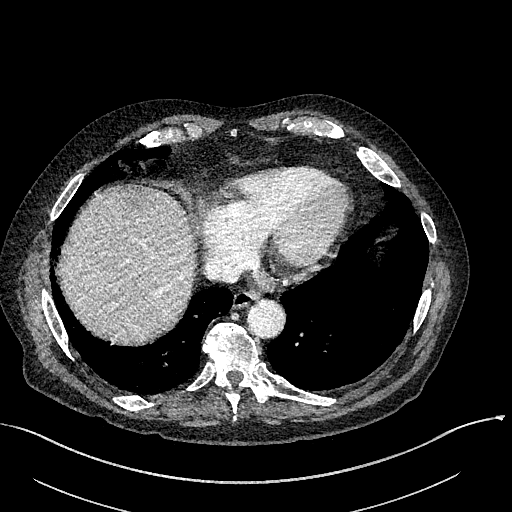
[im 1259/1399  lung]
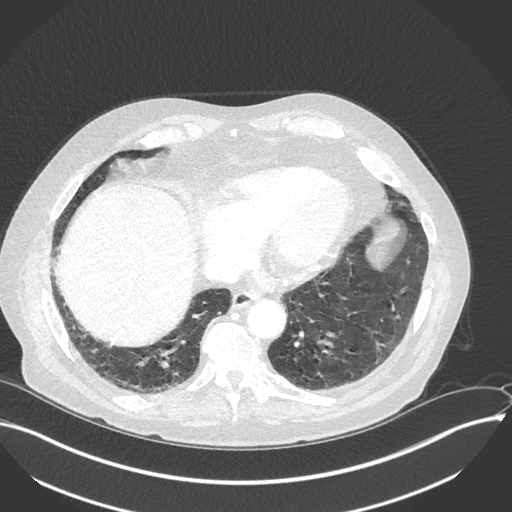

[9 of 46 positions shown; findings below may reference images not displayed]

RADIATION DOSE REDUCTION: This exam was performed according to the
departmental dose-optimization program which includes automated
exposure control, adjustment of the mA and/or kV according to
patient size and/or use of iterative reconstruction technique.

CONTRAST:  100mL OMNIPAQUE IOHEXOL 350 MG/ML SOLN
FINDINGS: VASCULAR

Aorta: There is moderate calcific atherosclerosis and slight
tortuosity. There is no penetrating ulcer, dissection, stenosis or
aneurysm.

Celiac: There are scattered calcifications in the celiac axis and
branch arteries but no flow-limiting stenosis, dissection or
aneurysm.

SMA: There are scattered patchy calcifications without flow-limiting
stenosis, aneurysm or dissection.

Renals: There is a duplicated left renal artery, the more posterior
of which is dominant and both branching from the aorta at a similar
level. The dominant artery demonstrates scattered calcification
without stenosis or dissection. The nondominant, smaller artery
demonstrates a 50% calcific origin stenosis and otherwise appears
patent.

There is a single right renal artery with nonstenosing ostial
calcifications, and scattered trace nonstenosing calcifications more
distally. There is no flow-limiting stenosis, aneurysm or
dissection.

IMA: Patent without evidence of aneurysm, dissection, vasculitis or
significant stenosis.

Inflow: There is moderate to heavy calcification in the common iliac
arteries which both measure slightly aneurysmal at 1.5 cm each.
There is additional calcification also moderate to heavy in the
external iliac arteries, with symmetric mild ectasia without
stenosis, dissection or aneurysm.

There are patchy calcifications in the internal iliac arteries
without stenosis or dissection, with 12 mm ectasia of the proximal
left internal iliac artery, 10 mm ectasia of the proximal right
internal iliac artery.

Proximal Outflow: Patchy calcifications are noted without
flow-limiting stenosis in the common femoral arteries, deep and
circumflex femoral arteries and proximal superficial femoral
arteries.

Veins: No obvious venous abnormality within the limitations of this
arterial phase study.

Review of the MIP images confirms the above findings.

NON-VASCULAR

Lower chest: Lung bases are clear of infiltrate, with mild elevation
right hemidiaphragm. There is mild cardiomegaly with three-vessel
calcific CAD, calcification in the aortic valve and 4.1 cm
aneurysmal change in the aortic root.

Hepatobiliary: 20.1 cm in length mildly steatotic liver. There is a
small cyst about the liver hilum, unchanged. No mass enhancement.

Pancreas: Unremarkable.

Spleen: No mass enhancement or splenomegaly.

Adrenals/Urinary Tract: Stable bilateral renal cysts, largest on the
left in the midpole measuring 5.8 cm, largest on the right is in the
upper pole measuring 3.1 cm. There is a wedge-shaped cortical scar
chronically in the inferior pole left kidney.

There is no adrenal mass. No urinary stone or obstruction. No
significant bladder thickening accounting for partial contraction.

Stomach/Bowel: Small hiatal hernia. The stomach, unopacified small
bowel are unremarkable with normal caliber appendix.

There is moderate wall thickening with surrounding inflammatory
reaction in the mid/distal transverse and descending colon, with
scattered mucosal surface enhancement.

There are uncomplicated sigmoid diverticula and evidence of prior
sigmoid colectomy and primary surgical anastomosis. There is fluid
or blood collecting in the rectum without wall thickening seen past
the descending/sigmoid junction. There is no wall pneumatosis,
abscess or free air.

Lymphatic: No lymphadenopathy is seen.

Reproductive: Prostate again noted enlarged 5 cm transverse with
fiducial markers in the prostate as before.

Other: There is a small amount of free fluid in the left paracolic
gutter likely reactive. No localizing collection is seen. There is
no free air, hemorrhage or abscess. There are small inguinal fat
hernias.

Musculoskeletal: There is osteopenia, with advanced degenerative
disc changes in the lumbar spine at L2-3, L3-4 and L4-5, with
spondylosis and facet hypertrophy. Ankylosis both SI joints.
Asymmetrically advanced DJD right hip.
IMPRESSION: VASCULAR

1. Aortoiliac atherosclerosis. No flow-limiting stenosis or
dissection is seen in the abdominal aorta and most of the branch
arteries. There are nonstenosing calcifications in the branch
arteries, except for a 50% calcific stenosis in the smaller of 2
left renal arteries.
2. 4.1 cm aneurysmal prominence of the aortic root. Annual CTA or
MRA follow-up recommended with routine baseline nonemergent CTA
chest recommended since the last chest CT was [DATE].
3. 1.5 cm prominence of the common iliac arteries, with ectatic
external and internal iliac arteries.
4. No contrast extravasation into bowel is visible during the study.

NON-VASCULAR

1. Evidence of transverse and descending colitis, likely infectious
or inflammatory. Ischemic etiology considered less likely due to
normal opacification of the SMA/IMA and SMV. There is no bowel
pneumatosis. No free air or abscess. Nonlocalizing reactive fluid is
seen in the left paracolic gutter.
2. Renal cysts and scarring.
3. Enlarged prostate, with fiducial markers.
4. Remaining findings described above.

## 2021-06-22 MED ORDER — SODIUM CHLORIDE 0.9 % IV SOLN: INTRAVENOUS | Status: DC

## 2021-06-22 MED ORDER — METRONIDAZOLE 500 MG/100ML IV SOLN
500.0000 mg | Freq: Once | INTRAVENOUS | Status: AC
  Administered 2021-06-22: 500 mg via INTRAVENOUS
  Filled 2021-06-22: qty 100

## 2021-06-22 MED ORDER — ONDANSETRON HCL 4 MG PO TABS: 4.0000 mg | ORAL_TABLET | Freq: Four times a day (QID) | ORAL | Status: DC | PRN

## 2021-06-22 MED ORDER — ACETAMINOPHEN 325 MG PO TABS: 650.0000 mg | ORAL_TABLET | Freq: Four times a day (QID) | ORAL | Status: DC | PRN

## 2021-06-22 MED ORDER — MECLIZINE HCL 25 MG PO TABS
25.0000 mg | ORAL_TABLET | Freq: Every day | ORAL | Status: DC
  Administered 2021-06-22 – 2021-07-03 (×11): 25 mg via ORAL
  Filled 2021-06-22 (×12): qty 1

## 2021-06-22 MED ORDER — METOPROLOL SUCCINATE ER 50 MG PO TB24
50.0000 mg | ORAL_TABLET | Freq: Every day | ORAL | Status: DC
  Administered 2021-06-22 – 2021-06-25 (×3): 50 mg via ORAL
  Filled 2021-06-22 (×3): qty 1

## 2021-06-22 MED ORDER — ATORVASTATIN CALCIUM 20 MG PO TABS
40.0000 mg | ORAL_TABLET | Freq: Every day | ORAL | Status: DC
Start: 2021-06-22 — End: 2021-07-03
  Administered 2021-06-22 – 2021-07-02 (×11): 40 mg via ORAL
  Filled 2021-06-22 (×11): qty 2

## 2021-06-22 MED ORDER — ADULT MULTIVITAMIN W/MINERALS CH
1.0000 | ORAL_TABLET | Freq: Every day | ORAL | Status: DC
  Administered 2021-06-22 – 2021-07-03 (×6): 1 via ORAL
  Filled 2021-06-22 (×11): qty 1

## 2021-06-22 MED ORDER — SODIUM CHLORIDE 0.9% FLUSH: 3.0000 mL | INTRAVENOUS | Status: DC | PRN

## 2021-06-22 MED ORDER — ADULT MULTIVITAMIN W/MINERALS CH
1.0000 | ORAL_TABLET | Freq: Every day | ORAL | Status: DC
  Administered 2021-06-25 – 2021-07-03 (×6): 1 via ORAL
  Filled 2021-06-22 (×10): qty 1

## 2021-06-22 MED ORDER — IOHEXOL 350 MG/ML SOLN
100.0000 mL | Freq: Once | INTRAVENOUS | Status: AC | PRN
  Administered 2021-06-22: 100 mL via INTRAVENOUS

## 2021-06-22 MED ORDER — THIAMINE HCL 100 MG PO TABS
100.0000 mg | ORAL_TABLET | Freq: Every day | ORAL | Status: DC
  Administered 2021-06-22 – 2021-07-03 (×10): 100 mg via ORAL
  Filled 2021-06-22 (×11): qty 1

## 2021-06-22 MED ORDER — SENNA 8.6 MG PO TABS
1.0000 | ORAL_TABLET | Freq: Every day | ORAL | Status: DC
  Administered 2021-06-22 – 2021-07-03 (×5): 8.6 mg via ORAL
  Filled 2021-06-22 (×9): qty 1

## 2021-06-22 MED ORDER — SODIUM CHLORIDE 0.9 % IV BOLUS
1000.0000 mL | Freq: Once | INTRAVENOUS | Status: AC
  Administered 2021-06-22: 1000 mL via INTRAVENOUS

## 2021-06-22 MED ORDER — ACETAMINOPHEN 650 MG RE SUPP: 650.0000 mg | Freq: Four times a day (QID) | RECTAL | Status: DC | PRN

## 2021-06-22 MED ORDER — SODIUM CHLORIDE 0.9 % IV SOLN: INTRAVENOUS | Status: AC

## 2021-06-22 MED ORDER — FOLIC ACID 1 MG PO TABS
1.0000 mg | ORAL_TABLET | Freq: Every day | ORAL | Status: DC
  Administered 2021-06-22 – 2021-07-03 (×11): 1 mg via ORAL
  Filled 2021-06-22 (×11): qty 1

## 2021-06-22 MED ORDER — LORAZEPAM 2 MG/ML IJ SOLN
1.0000 mg | INTRAMUSCULAR | Status: AC | PRN
  Administered 2021-06-22: 1 mg via INTRAVENOUS
  Filled 2021-06-22: qty 1

## 2021-06-22 MED ORDER — POLYETHYLENE GLYCOL 3350 17 G PO PACK
17.0000 g | PACK | Freq: Every day | ORAL | Status: DC
  Administered 2021-06-25 – 2021-06-26 (×2): 17 g via ORAL
  Filled 2021-06-22 (×3): qty 1

## 2021-06-22 MED ORDER — LORAZEPAM 2 MG PO TABS
0.0000 mg | ORAL_TABLET | Freq: Four times a day (QID) | ORAL | Status: AC
  Filled 2021-06-22: qty 1

## 2021-06-22 MED ORDER — SERTRALINE HCL 50 MG PO TABS
50.0000 mg | ORAL_TABLET | Freq: Every day | ORAL | Status: DC
  Administered 2021-06-22 – 2021-07-03 (×11): 50 mg via ORAL
  Filled 2021-06-22 (×11): qty 1

## 2021-06-22 MED ORDER — ONDANSETRON HCL 4 MG/2ML IJ SOLN
4.0000 mg | Freq: Four times a day (QID) | INTRAMUSCULAR | Status: DC | PRN
  Administered 2021-06-22 – 2021-06-25 (×2): 4 mg via INTRAVENOUS
  Filled 2021-06-22 (×2): qty 2

## 2021-06-22 MED ORDER — SODIUM CHLORIDE 0.9 % IV SOLN
1.0000 g | Freq: Once | INTRAVENOUS | Status: AC
  Administered 2021-06-22: 1 g via INTRAVENOUS
  Filled 2021-06-22: qty 10

## 2021-06-22 MED ORDER — LORAZEPAM 2 MG PO TABS: 0.0000 mg | ORAL_TABLET | Freq: Two times a day (BID) | ORAL | Status: AC

## 2021-06-22 MED ORDER — VITAMIN B-12 1000 MCG PO TABS
1000.0000 ug | ORAL_TABLET | Freq: Every day | ORAL | Status: DC
  Administered 2021-06-22 – 2021-07-03 (×11): 1000 ug via ORAL
  Filled 2021-06-22 (×12): qty 1

## 2021-06-22 MED ORDER — LISINOPRIL 20 MG PO TABS
40.0000 mg | ORAL_TABLET | Freq: Every day | ORAL | Status: DC
Start: 2021-06-22 — End: 2021-06-28
  Administered 2021-06-22 – 2021-06-28 (×6): 40 mg via ORAL
  Filled 2021-06-22: qty 4
  Filled 2021-06-22: qty 2
  Filled 2021-06-22: qty 4
  Filled 2021-06-22 (×3): qty 2

## 2021-06-22 MED ORDER — FLUTICASONE PROPIONATE 50 MCG/ACT NA SUSP
1.0000 | Freq: Every day | NASAL | Status: DC | PRN
  Administered 2021-06-26: 1 via NASAL
  Filled 2021-06-22 (×2): qty 16

## 2021-06-22 MED ORDER — LORAZEPAM 1 MG PO TABS: 1.0000 mg | ORAL_TABLET | ORAL | Status: AC | PRN

## 2021-06-22 MED ORDER — PANTOPRAZOLE SODIUM 40 MG PO TBEC
40.0000 mg | DELAYED_RELEASE_TABLET | Freq: Every day | ORAL | Status: DC
  Administered 2021-06-22 – 2021-07-03 (×11): 40 mg via ORAL
  Filled 2021-06-22 (×11): qty 1

## 2021-06-22 MED ORDER — SODIUM CHLORIDE 0.9% FLUSH
3.0000 mL | Freq: Two times a day (BID) | INTRAVENOUS | Status: DC
  Administered 2021-06-22 – 2021-07-02 (×19): 3 mL via INTRAVENOUS

## 2021-06-22 MED ORDER — FUROSEMIDE 20 MG PO TABS
20.0000 mg | ORAL_TABLET | Freq: Every day | ORAL | Status: DC
Start: 2021-06-23 — End: 2021-06-27
  Administered 2021-06-23 – 2021-06-26 (×3): 20 mg via ORAL
  Filled 2021-06-22 (×4): qty 1

## 2021-06-22 MED ORDER — THIAMINE HCL 100 MG/ML IJ SOLN
100.0000 mg | Freq: Every day | INTRAMUSCULAR | Status: DC
  Administered 2021-06-25: 100 mg via INTRAVENOUS
  Filled 2021-06-22 (×2): qty 2

## 2021-06-22 MED ORDER — SODIUM CHLORIDE 0.9 % IV SOLN: 250.0000 mL | INTRAVENOUS | Status: DC | PRN

## 2021-06-22 NOTE — ED Notes (Signed)
Pt up to toilet at this time. ?

## 2021-06-22 NOTE — Assessment & Plan Note (Addendum)
Patient with a history of alcohol use disorder ?On CIWA protocol and administer lorazepam for CIWA score of 8 or greater ?Patient on MVI, thiamine and folic acid ?

## 2021-06-22 NOTE — Assessment & Plan Note (Addendum)
Related to opioid use for pain control ?We will start patient on MiraLAX 17 g daily as well as senna 1 tablet daily.   ?Now having good bowel movements and MiraLAX discontinued . ?

## 2021-06-22 NOTE — Assessment & Plan Note (Signed)
Most likely related to bleeding hemorrhoids ?Will consult GI for further evaluation ?Check serial H&H and transfuse as needed ?

## 2021-06-22 NOTE — Assessment & Plan Note (Addendum)
Felt to be secondary to left-sided colitis.  Infectious favored over inflammatory.  Continue to monitor closely.  Cultures unremarkable.  Hemoglobin remained stable. ?

## 2021-06-22 NOTE — Assessment & Plan Note (Addendum)
Blood pressure is stable, slightly softer with atrial fibrillation when rapid. ?Continue lisinopril and have increased metoprolol ?

## 2021-06-22 NOTE — ED Provider Notes (Signed)
[facility] Provider Note    Event Date/Time   First MD Initiated Contact with Patient 06/22/21 0148     (approximate)   History   No chief complaint on file.   HPI  Cameron Foster. is a 81 y.o. male with medical history of chronic alcohol use, paroxysmal A-fib not on anticoagulation, CVA, hypertension, history of upper GI bleed secondary to pyloric ulcer who presents with bright red blood per rectum.  Patient notes that over the last day or so he has been constipated having to strain.  After straining he began to have bright red blood per rectum.  Notes that the blood is not mixed in with the stool.  Since yesterday he has had several episodes where he tries to go to the bathroom and will just have blood.  He has some mild abdominal pain no fevers.  Denies any hematemesis or black stool.  Last drink was 2 nights ago, drinks wine nightly.  No history of withdrawal per the patient.    Past Medical History:  Diagnosis Date   Alcoholism [facility])    Arthritis    Atrial fibrillation ([place]) 1995   one episode   Basal cell carcinoma 04/26/2017   Right medial cheek. Superficial and nodular   Basal cell carcinoma 06/11/2019   Left anterior shoulder. Nodular pattern   Basal cell carcinoma 09/09/2019   Right nasal ala, EDC   Basal cell carcinoma 02/05/2020   L upper eyebrow, EDC    Carotid arterial disease ([place])    a. 06/29/2019 Carotid U/S: <50% bilat ICA stenoses.   Chicken pox    Colon cancer (HCC)    Colon polyp    Depression    Son died June 28, 2014   Diverticulitis    Diverticulosis 30 years   Diverticulosis    Dysrhythmia    Gastritis    GERD (gastroesophageal reflux disease)    H. pylori infection    History of cerebral hemorrhage    History of echocardiogram    a. 06/29/2019 Echo: EF 55-60%, no rwma, triv MR/AI.   History of stress test    a. 09/2015 MV: No ischemia/infarct. EF 45-54% (nl by echo).   Hyperlipidemia    Hypertension    Irritable bowel syndrome     Prostate cancer ([place]) Jun 28, 2010   treated with radiation therapy. Prostate   PSVT (paroxysmal supraventricular tachycardia) ([place])    a. 06/2019 Zio: Avg rate 74 (54-120), occas PACs, rare PVCs, 125 episodes of PVCs (longest 17.5 secs; max rate 187). No afib.   Squamous cell carcinoma of skin 07/18/2017   Left medial calf. KA type   Squamous cell carcinoma of skin 04/24/2018   Right above med. brow   Squamous cell carcinoma of skin 06/11/2019   Right posterior shoulder. SCCis, hypertrophic   Squamous cell carcinoma of skin 01/09/2020   Mid nasal dorsum, MOHS, Efudex x 4wks   Stroke [facility])    Vasovagal syncope     Patient Active Problem List   Diagnosis Date Noted   Actinic keratosis 05/24/2021   Squamous cell cancer of skin of left forearm 05/24/2021   Chronic gastric ulcer with hemorrhage 02/25/2021   Irregular Z line of esophagus 02/25/2021   Chronic pansinusitis 01/26/2021   Dizziness 01/26/2021   Chronic cough 01/26/2021   Need for influenza vaccination 01/26/2021   Hospital discharge follow-up 01/26/2021   [place]ena 01/13/2021   Acute blood loss anemia 01/13/2021   Alcohol use disorder, moderate, dependence ([place]) 01/13/2021  Stage 3a chronic kidney disease ([place]) 11/23/2020   Anemia 11/23/2020   Chronic pain of right ankle 05/21/2020   Encounter for attention to colostomy ([place]) 05/21/2020   Achilles tendinitis 04/30/2020   Synovitis and tenosynovitis 04/30/2020   Lymphedema 03/03/2020   Chronic venous insufficiency 03/03/2020   Fatigue 02/14/2020   Leg edema 02/14/2020   Foraminal stenosis of lumbar region 11/08/2019   Chronic bilateral low back pain without sciatica 10/18/2019   Foraminal stenosis of cervical region 10/18/2019   PSVT (paroxysmal supraventricular tachycardia) ([place]) 08/01/2019   Lacunar infarction ([place]) 07/10/2019   History of lacunar cerebrovascular accident (CVA) 07/01/2019   Cerebrovascular accident (CVA) ([place]) 06/08/2019   Paroxysmal atrial  fibrillation ([place]) 11/07/2018   TIA (transient ischemic attack) 10/20/2018   History of subarachnoid hemorrhage 10/16/2018   Acute recurrent sinusitis 10/16/2018   Low libido 08/30/2018   Obesity 05/14/2018   Hyperlipidemia LDL goal <70 05/14/2018   Degeneration of lumbar intervertebral disc 03/22/2018   Osteoarthritis of hip 03/22/2018   Trochanteric bursitis of right hip 03/22/2018   History of colostomy reversal 03/21/2017   Status post partial colectomy 01/03/2017   Depression, major, single episode, mild ([place]) 01/03/2017   Vasovagal syncope 01/27/2016   History of skin cancer 10/28/2015   Osteoarthritis of right knee 10/01/2015   Essential hypertension 08/25/2015   Dyspnea on exertion 08/12/2015   Erectile dysfunction following radiation therapy 08/04/2015   Constipation 08/02/2015   History of prostate cancer 01/20/2015   Swelling of right lower extremity 01/20/2015     Physical Exam  Triage Vital Signs: ED Triage Vitals  Enc Vitals Group     BP 06/21/21 2032 125/85     Pulse Rate 06/21/21 2032 82     Resp 06/21/21 2032 14     Temp 06/21/21 2032 98.6 F (37 C)     Temp Source 06/21/21 2032 Oral     SpO2 06/21/21 2032 96 %     Weight 06/21/21 2033 200 lb (90.7 kg)     Height 06/21/21 2033 5' 8"  (1.727 m)     Head Circumference --      Peak Flow --      Pain Score 06/21/21 2033 4     Pain Loc --      Pain Edu? --      Excl. in [place]? --     Most recent vital signs: Vitals:   06/22/21 0000 06/22/21 0142  BP: (!) 155/83 (!) 155/74  Pulse: 80 79  Resp: 18 20  Temp: 98 F (36.7 C) 98.1 F (36.7 C)  SpO2: 95% 95%     General: Awake, no distress.  CV:  Good peripheral perfusion.  Resp:  Normal effort.  Abd:  No distention.  Mild tenderness in the bilateral lower quadrants Neuro:             Awake, Alert, Oriented x 3  Other:  Bright red blood on rectal exam, external hemorrhoids noted   ED Results / Procedures / Treatments  Labs (all labs ordered are  listed, but only abnormal results are displayed) Labs Reviewed  COMPREHENSIVE METABOLIC PANEL - Abnormal; Notable for the following components:      Result Value   Glucose, Bld 175 (*)    All other components within normal limits  CBC - Abnormal; Notable for the following components:   WBC 18.7 (*)    All other components within normal limits  GASTROINTESTINAL PANEL BY PCR, STOOL (REPLACES STOOL CULTURE)  LACTIC ACID, PLASMA  LACTIC ACID, PLASMA  POC OCCULT BLOOD, ED  TYPE AND SCREEN     EKG     RADIOLOGY I reviewed the read of the CT abdomen pelvis which shows colitis  in descending colon, no vascular obstruction   PROCEDURES:  Critical Care performed: No  Procedures  The patient is on the cardiac monitor to evaluate for evidence of arrhythmia and/or significant heart rate changes.   MEDICATIONS ORDERED IN ED: Medications  sodium chloride 0.9 % bolus 1,000 mL (has no administration in time range)  cefTRIAXone (ROCEPHIN) 1 g in sodium chloride 0.9 % 100 mL IVPB (has no administration in time range)  metroNIDAZOLE (FLAGYL) IVPB 500 mg (has no administration in time range)  iohexol (OMNIPAQUE) 350 MG/ML injection 100 mL (100 mLs Intravenous Contrast Given 06/22/21 0249)     IMPRESSION / MDM / ASSESSMENT AND PLAN / ED COURSE  I reviewed the triage vital signs and the nursing notes.                              Differential diagnosis includes, but is not limited to, diverticular hemorrhage, hemorrhoidal bleeding, colitis  Is a 81 year old male presenting with bright red blood per rectum x1 day.  Started after he felt constipated and was straining.  Endorses bright red blood per rectum not mixed in with stool.  Has some generalized abdominal discomfort as well no fever.  Vital signs are stable.  On exam patient has some mild abdominal tenderness particular in the lower quadrants.  He has bright red blood on rectal exam with external hemorrhoids noted.  Reviewed his last  colonoscopy from 2018 which showed diverticulosis without other acute findings.  Does have a history of upper GI bleed he is not anticoagulated due to history of subarachnoid hemorrhage.  Question whether this is hemorrhoidal bleeding from his constipation versus a colitis.  He does have a Vancuren count of 18 with normal hemoglobin.  Obtained a CT angio of the abdomen pelvis both to evaluate for blush also to evaluate for colitis and other sources of abdominal pain.  We will send type and screen and lactate given the leukocytosis.  CT of the abdomen shows no vascular occlusion.  No active extravasation.  Does have evidence of colitis in the transverse and descending colon which radiology comments is most likely infectious versus inflammatory.  Given no occlusion of the abdominal vasculature less likely to be ischemic.  Given patient's age and ongoing rectal bleeding will admit for observation.  Will cover with ceftriaxone Flagyl for the intra-abdominal infection.  Discussed with hospitalist for admission.      FINAL CLINICAL IMPRESSION(S) / ED DIAGNOSES   Final diagnoses:  Colitis  Rectal bleeding     Rx / DC Orders   ED Discharge Orders     None        Note:  This document was prepared using Dragon voice recognition software and may include unintentional dictation errors.   Rada Hay, MD 06/22/21 [phone]

## 2021-06-22 NOTE — Consult Note (Signed)
Cameron Foster , MD [address], [place], [place], [place], [id number] [place], [place], [place], [address] Phone: [phone]  Fax: [phone]  Consultation  Referring Provider:    Dr. Francine Graven Primary Care Physician:  [name], MD Primary Gastroenterologist:  Dr. Alice Reichert         Reason for Consultation:     Hematochezia  Date of Admission:  06/22/2021 Date of Consultation:  06/22/2021         HPI:   Cameron Foster. is a 81 y.o. male is a patient who follows Dr. Alice Reichert as an outpatient last seen at their office on 02/25/2021 mentions that has had a history of a GI bleed with gastric ulcers.  EGD on 04/28/2021 demonstrated irregular Z-line mild diffuse portal hypertensive gastropathy nonbleeding healing gastric ulcer.  Has a history of chronic alcohol use, CVA, hypertension presents to the emergency room with history of constipation and after straining began to have bright red blood per rectum and has noted blood every time he goes to the restroom. Not had a bowel movement for the past few hours, no abdominal pain, no nsaid use.  Last drink of alcohol was 2 days back.  CT angiogram performed in the ER showed transverse and descending colitis likely infectious or inflammatory.  Hemoglobin 13.2 g on admission CMP normal except elevated glucose.  Being treated for an infectious colitis.  2016-06-17 : incomplete colonoscopy by Dr Gustavo Lah - CT virtual colonoscopy - diverticulosis of colon.  When I went in to see the patient at the ER denied any significant abdominal discomfort.  Not had a bowel movement for a few hours.  Past Medical History:  Diagnosis Date   Alcoholism [facility])    Arthritis    Atrial fibrillation ([place]) 1995   one episode   Basal cell carcinoma 04/26/2017   Right medial cheek. Superficial and nodular   Basal cell carcinoma 06/11/2019   Left anterior shoulder. Nodular pattern   Basal cell carcinoma 09/09/2019   Right nasal ala, ED[facility]   Basal cell  carcinoma 02/05/2020   L upper eyebrow, [facility]    Carotid arterial disease ([place])    a. Jun 18, 2019 Carotid U/S: <50% bilat ICA stenoses.   Chicken pox    Colon cancer ([facility])    Colon polyp    Depression    Son died 17-Jun-2014   Diverticulitis    Diverticulosis 30 years   Diverticulosis    Dysrhythmia    Gastritis    GERD (gastroesophageal reflux disease)    H. pylori infection    History of cerebral hemorrhage    History of echocardiogram    a. 06/18/2019 Echo: EF 55-60%, no rwma, triv MR/AI.   History of stress test    a. 09/2015 MV: No ischemia/infarct. EF 45-54% (nl by echo).   Hyperlipidemia    Hypertension    Irritable bowel syndrome    Prostate cancer ([place]) 2010-06-17   treated with radiation therapy. Prostate   PSVT (paroxysmal supraventricular tachycardia) ([place])    a. 06/2019 Zio: Avg rate 74 (54-120), occas PACs, rare PVCs, 125 episodes of PVCs (longest 17.5 secs; max rate 187). No afib.   Squamous cell carcinoma of skin 07/18/2017   Left medial calf. KA type   Squamous cell carcinoma of skin 04/24/2018   Right above med. brow   Squamous cell carcinoma of skin 06/11/2019   Right posterior shoulder. SCCis, hypertrophic   Squamous cell carcinoma of skin 01/09/2020   Mid  nasal dorsum, MOHS, Efudex x 4wks   Stroke [facility])    Vasovagal syncope     Past Surgical History:  Procedure Laterality Date   CARDIAC CATHETERIZATION  1998   [place] no stents   CATARACT EXTRACTION, BILATERAL     COLON RESECTION SIGMOID N/A 12/07/2016   Procedure: COLON RESECTION SIGMOID;  Surgeon: Clayburn Pert, MD;  Location: [facility] ORS;  Service: General;  Laterality: N/A;   COLON SURGERY  11/2016   Colostomy   COLONOSCOPY  2016   COLONOSCOPY WITH PROPOFOL N/A 05/30/2016   Procedure: COLONOSCOPY WITH PROPOFOL;  Surgeon: Lollie Sails, MD;  Location[facility] ENDOSCOPY;  Service: Endoscopy;  Laterality: N/A;   COLOSTOMY Left 12/07/2016   Procedure: COLOSTOMY;  Surgeon: Clayburn Pert, MD;  Location: [facility]  ORS;  Service: General;  Laterality: Left;   COLOSTOMY REVERSAL N/A 03/21/2017   Procedure: COLOSTOMY REVERSAL;  Surgeon: Clayburn Pert, MD;  Location: [facility] ORS;  Service: General;  Laterality: N/A;   COLOSTOMY TAKEDOWN N/A 03/21/2017   Procedure: LAPAROSCOPIC COLOSTOMY TAKEDOWN;  Surgeon: Clayburn Pert, MD;  Location: [facility] ORS;  Service: General;  Laterality: N/A;   CYSTOSCOPY WITH STENT PLACEMENT Bilateral 03/21/2017   Procedure: CYSTOSCOPY WITH LIGHTED STENT PLACEMENT;  Surgeon: Abbie Sons, MD;  Location: [facility] ORS;  Service: Urology;  Laterality: Bilateral;   ESOPHAGOGASTRODUODENOSCOPY     ESOPHAGOGASTRODUODENOSCOPY N/A 05/30/2016   Procedure: ESOPHAGOGASTRODUODENOSCOPY (EGD);  Surgeon: Lollie Sails, MD;  Location: [facility] ENDOSCOPY;  Service: Endoscopy;  Laterality: N/A;   ESOPHAGOGASTRODUODENOSCOPY N/A 01/14/2021   Procedure: ESOPHAGOGASTRODUODENOSCOPY (EGD);  Surgeon: Toledo, Benay Pike, MD;  Location: [facility] ENDOSCOPY;  Service: Gastroenterology;  Laterality: N/A;   ESOPHAGOGASTRODUODENOSCOPY (EGD) WITH PROPOFOL N/A 04/28/2021   Procedure: ESOPHAGOGASTRODUODENOSCOPY (EGD) WITH PROPOFOL;  Surgeon: Toledo, Benay Pike, MD;  Location: [facility] ENDOSCOPY;  Service: Gastroenterology;  Laterality: N/A;   EYE SURGERY     cataracts   FLEXIBLE SIGMOIDOSCOPY N/A 03/21/2017   Procedure: FLEXIBLE SIGMOIDOSCOPY;  Surgeon: Clayburn Pert, MD;  Location: [facility] ORS;  Service: General;  Laterality: N/A;   FRACTURE SURGERY Bilateral    right arm and left wrist   INCISION AND DRAINAGE ABSCESS N/A 12/07/2016   Procedure: DRAINAGE  OF INTRA ABDOMINAL ABSCESS;  Surgeon: Clayburn Pert, MD;  Location: [facility] ORS;  Service: General;  Laterality: N/A;   LAPAROTOMY N/A 12/07/2016   Procedure: EXPLORATORY LAPAROTOMY;  Surgeon: Clayburn Pert, MD;  Location: [facility] ORS;  Service: General;  Laterality: N/A;   PROSTATE SURGERY     Microwave therapy   TONSILLECTOMY     TOTAL KNEE ARTHROPLASTY Right  07/27/2016   Procedure: TOTAL KNEE ARTHROPLASTY;  Surgeon: [name], MD;  Location: [facility] ORS;  Service: Orthopedics;  Laterality: Right;  Dr. Erlene Quan had to place Urinary catheter due to prostate cancer history.  Using flexible scope.    Prior to Admission medications   Medication Sig Start Date End Date Taking? Authorizing Provider  amLODipine (NORVASC) 5 MG tablet TAKE 1 TABLET BY MOUTH EVERY DAY 06/21/21   Bacigalupo, Dionne Bucy, MD  aspirin 81 MG EC tablet Take 81 mg by mouth daily.     [provider]  atorvastatin (LIPITOR) 40 MG tablet TAKE 1 TABLET BY MOUTH EVERY DAY 09/02/20   Bacigalupo, Dionne Bucy, MD  docusate sodium (COLACE) 100 MG capsule Take 100 mg by mouth daily as needed for mild constipation.    [provider]  fluticasone (FLONASE) 50 MCG/ACT nasal spray Place 1 spray into both nostrils daily.    [provider]  furosemide (LASIX) 20 MG tablet TAKE 1 TABLET BY MOUTH EVERY DAY 04/13/21   End, Harrell Gave, MD  Guaifenesin [facility] MAXIMUM STRENGTH) 1200 MG TB12 Take 1 tablet by mouth daily as needed.    [provider]  hydrocortisone 2.5 % cream APPLY TO AFFECTED AREA TWICE A DAY AS NEEDED FOR RASH 10/22/20   Bacigalupo, Dionne Bucy, MD  lisinopril (ZESTRIL) 40 MG tablet TAKE 1 TABLET BY MOUTH EVERY DAY 06/21/21   Bacigalupo, Dionne Bucy, MD  meclizine (ANTIVERT) 25 MG tablet Take 25 mg by mouth daily. 04/14/21   [provider]  metoprolol succinate (TOPROL-XL) 50 MG 24 hr tablet TAKE 1 TABLET BY MOUTH DAILY. TAKE WITH OR IMMEDIATELY FOLLOWING A MEAL. 03/04/21   [name], MD  Multiple Vitamin (MULTIVITAMIN WITH MINERALS) TABS tablet Take 1 tablet by mouth daily. Patient taking differently: Take 1 tablet by mouth daily. Mens daily 50+ 10/24/18   Vaughan Basta, MD  pantoprazole (PROTONIX) 40 MG tablet Take 40 mg by mouth daily.    [provider]  sertraline (ZOLOFT) 50 MG tablet Take 1 tablet (50 mg total) by mouth  daily. 06/08/21   Mecum, Erin E, PA-C  sertraline (ZOLOFT) 50 MG tablet Take 1 tablet (50 mg total) by mouth daily. 06/21/21   Bacigalupo, Dionne Bucy, MD  traMADol (ULTRAM) 50 MG tablet Take 50 mg by mouth every 6 (six) hours as needed for severe pain.    [provider]  vitamin B-12 (CYANOCOBALAMIN) 1000 MCG tablet Take 1,000 mcg by mouth daily.    [provider]    Family History  Problem Relation Age of Onset   Lung cancer Father        smoker   Other Mother    Sudden death Son        due to "Blood clots"   Bipolar disorder Son    Heart disease Son    Kidney disease Daughter        congenital one small kidney   Prostate cancer Neg Hx    Bladder Cancer Neg Hx      Social History   Tobacco Use   Smoking status: Former    Packs/day: 1.00    Years: 27.00    Pack years: 27.00    Types: Cigarettes    Quit date: 04/18/1988    Years since quitting: 33.2   Smokeless tobacco: Current    Types: Chew  Vaping Use   Vaping Use: Never used  Substance Use Topics   Alcohol use: Yes    Alcohol/week: 7.0 standard drinks    Types: 7 Glasses of wine per week    Comment: weekly-Former heavy use ETOH   Drug use: No    Allergies as of 06/21/2021 - Review Complete 06/21/2021  Allergen Reaction Noted   Formaldehyde Rash 01/20/2015    Review of Systems:    All systems reviewed and negative except where noted in HPI.   Physical Exam:  Vital signs in last 24 hours: Temp:  [98 F (36.7 C)-99.1 F (37.3 C)] 98.4 F (36.9 C) (03/07 0642) Pulse Rate:  [79-93] 93 (03/07 0830) Resp:  [14-21] 21 (03/07 0830) BP: (125-164)/(74-89) 153/78 (03/07 0830) SpO2:  [94 %-97 %] 97 % (03/07 0830) Weight:  [90.7 kg] 90.7 kg (03/06 2033)   General:   Pleasant, cooperative in NAD Head:  Normocephalic and atraumatic. Eyes:   No icterus.   Conjunctiva pink. PERRLA. Ears:  Normal auditory acuity. Neck:  Supple; no masses or thyroidomegaly  Lungs: Respirations even and unlabored. Lungs  clear to auscultation bilaterally.   No wheezes, crackles, or rhonchi.  Heart:  Regular rate and rhythm;  Without murmur, clicks, rubs or gallops Abdomen:  Soft, nondistended, mild left-sided abdominal tenderness normal bowel sounds. No appreciable masses or hepatomegaly.  No rebound or guarding.  Neurologic:  Alert and oriented x3;  grossly normal neurologically. Psych:  Alert and cooperative. Normal affect.  LAB RESULTS: Recent Labs    06/21/21 2035  WBC 18.7*  HGB 13.2  HCT 41.2  PLT 275   BMET Recent Labs    06/21/21 2035  NA 136  K 3.8  CL 102  CO2 24  GLUCOSE 175*  BUN 17  CREATININE 1.15  CALCIUM 9.6   LFT Recent Labs    06/21/21 2035  PROT 7.6  ALBUMIN 3.9  AST 26  ALT 13  ALKPHOS 79  BILITOT 0.7   PT/INR No results for input(s): LABPROT, INR in the last 72 hours.  STUDIES: CT Angio Abd/Pel W and/or Wo Contrast  Result Date: 06/22/2021 CLINICAL DATA:  Lower GI bleed. Previous history of sigmoid colectomy and primary anastomosis. EXAM: CTA ABDOMEN AND PELVIS WITHOUT AND WITH CONTRAST TECHNIQUE: Multidetector CT imaging of the abdomen and pelvis was performed using the standard protocol during bolus administration of intravenous contrast. Multiplanar reconstructed images and MIPs were obtained and reviewed to evaluate the vascular anatomy. RADIATION DOSE REDUCTION: This exam was performed according to the departmental dose-optimization program which includes automated exposure control, adjustment of the mA and/or kV according to patient size and/or use of iterative reconstruction technique. CONTRAST:  147m OMNIPAQUE IOHEXOL 350 MG/ML SOLN COMPARISON:  Abdomen and pelvis CTs both with contrast, 01/13/2021 and 03/14/2020 FINDINGS: VASCULAR Aorta: There is moderate calcific atherosclerosis and slight tortuosity. There is no penetrating ulcer, dissection, stenosis or aneurysm. Celiac: There are scattered calcifications in the celiac axis and branch arteries but no  flow-limiting stenosis, dissection or aneurysm. SMA: There are scattered patchy calcifications without flow-limiting stenosis, aneurysm or dissection. Renals: There is a duplicated left renal artery, the more posterior of which is dominant and both branching from the aorta at a similar level. The dominant artery demonstrates scattered calcification without stenosis or dissection. The nondominant, smaller artery demonstrates a 50% calcific origin stenosis and otherwise appears patent. There is a single right renal artery with nonstenosing ostial calcifications, and scattered trace nonstenosing calcifications more distally. There is no flow-limiting stenosis, aneurysm or dissection. IMA: Patent without evidence of aneurysm, dissection, vasculitis or significant stenosis. Inflow: There is moderate to heavy calcification in the common iliac arteries which both measure slightly aneurysmal at 1.5 cm each. There is additional calcification also moderate to heavy in the external iliac arteries, with symmetric mild ectasia without stenosis, dissection or aneurysm. There are patchy calcifications in the internal iliac arteries without stenosis or dissection, with 12 mm ectasia of the proximal left internal iliac artery, 10 mm ectasia of the proximal right internal iliac artery. Proximal Outflow: Patchy calcifications are noted without flow-limiting stenosis in the common femoral arteries, deep and circumflex femoral arteries and proximal superficial femoral arteries. Veins: No obvious venous abnormality within the limitations of this arterial phase study. Review of the MIP images confirms the above findings. NON-VASCULAR Lower chest: Lung bases are clear of infiltrate, with mild elevation right hemidiaphragm. There is mild cardiomegaly with three-vessel calcific CAD, calcification in the aortic valve and 4.1 cm aneurysmal change in the aortic root. Hepatobiliary: 20.1 cm in length mildly steatotic liver. There  is a small cyst  about the liver hilum, unchanged. No mass enhancement. Pancreas: Unremarkable. Spleen: No mass enhancement or splenomegaly. Adrenals/Urinary Tract: Stable bilateral renal cysts, largest on the left in the midpole measuring 5.8 cm, largest on the right is in the upper pole measuring 3.1 cm. There is a wedge-shaped cortical scar chronically in the inferior pole left kidney. There is no adrenal mass. No urinary stone or obstruction. No significant bladder thickening accounting for partial contraction. Stomach/Bowel: Small hiatal hernia. The stomach, unopacified small bowel are unremarkable with normal caliber appendix. There is moderate wall thickening with surrounding inflammatory reaction in the mid/distal transverse and descending colon, with scattered mucosal surface enhancement. There are uncomplicated sigmoid diverticula and evidence of prior sigmoid colectomy and primary surgical anastomosis. There is fluid or blood collecting in the rectum without wall thickening seen past the descending/sigmoid junction. There is no wall pneumatosis, abscess or free air. Lymphatic: No lymphadenopathy is seen. Reproductive: Prostate again noted enlarged 5 cm transverse with fiducial markers in the prostate as before. Other: There is a small amount of free fluid in the left paracolic gutter likely reactive. No localizing collection is seen. There is no free air, hemorrhage or abscess. There are small inguinal fat hernias. Musculoskeletal: There is osteopenia, with advanced degenerative disc changes in the lumbar spine at L2-3, L3-4 and L4-5, with spondylosis and facet hypertrophy. Ankylosis both SI joints. Asymmetrically advanced DJD right hip. IMPRESSION: VASCULAR 1. Aortoiliac atherosclerosis. No flow-limiting stenosis or dissection is seen in the abdominal aorta and most of the branch arteries. There are nonstenosing calcifications in the branch arteries, except for a 50% calcific stenosis in the smaller of 2 left renal  arteries. 2. 4.1 cm aneurysmal prominence of the aortic root. Annual CTA or MRA follow-up recommended with routine baseline nonemergent CTA chest recommended since the last chest CT was 03/14/2020. 3. 1.5 cm prominence of the common iliac arteries, with ectatic external and internal iliac arteries. 4. No contrast extravasation into bowel is visible during the study. NON-VASCULAR 1. Evidence of transverse and descending colitis, likely infectious or inflammatory. Ischemic etiology considered less likely due to normal opacification of the SMA/IMA and SMV. There is no bowel pneumatosis. No free air or abscess. Nonlocalizing reactive fluid is seen in the left paracolic gutter. 2. Renal cysts and scarring. 3. Enlarged prostate, with fiducial markers. 4. Remaining findings described above. Electronically Signed   By: Telford Nab M.D.   On: 06/22/2021 03:47      Impression / Plan:   Cameron Foster. is a 81 y.o. y/o male with history of alcohol abuse admitted with history of constipation with subsequent straining and bright red blood per rectum.  CT angiogram showed colitis of the left side of the colon .  Differentials include infectious versus inflammatory colitis.  Hemoglobin stable.  Plan 1.  Monitor CBC and transfuse as needed. 2.  Check stool for GI PCR and C. difficile.  Patient usually an infectious colitis resolves on its own in 3 to 5 days time.  If has no further bleeding then can commence on oral diet.  Can follow-up at that point of time with Dr. Alice Reichert as an outpatient to determine if colonoscopy warranted in the future.   Thank you for involving me in the care of this patient.      LOS: 0 days   Cameron Bellows, MD  06/22/2021, 8:54 AM

## 2021-06-22 NOTE — H&P (Signed)
History and Physical    Patient: Cameron Foster. EZM:[id number] DOB: 03-Mar-1941 DOA: 06/22/2021 DOS: the patient was seen and examined on 06/22/2021 PCP: [name], MD  Patient coming from: Home  Chief Complaint: No chief complaint on file.  HPI: Cameron Foster. is a 81 y.o. male with medical history significant for alcoholism, A-fib, diverticulosis, depression, history of GI bleed secondary to pyloric ulcer, GERD who presents to the ER for evaluation of rectal bleeding. Patient states that he has been on pain medication for right hip pain and since then has developed constipation. He had gone to the bathroom to have a bowel movement and notes that he had to strain due to constipation and subsequently he started having bright red blood per rectum.  He states that the blood is not mixed in with stool and he had several episodes of copious amounts of bright red blood every time he tried to have a bowel movement.  He complained of feeling dizzy and lightheaded and states that he had to lay down on the bathroom floor.  He denies any loss of consciousness or falls. He denies having any chest pain or SOB, no abdominal pain, no nausea, no vomiting, no fever, no chills, no cough no urinary symptoms, no blurred vision or focal deficit. Patient noted to have a drop in his H&H from 13.2-10.4.  He will be admitted to the hospital for further evaluation Review of Systems: As mentioned in the history of present illness. All other systems reviewed and are negative. Past Medical History:  Diagnosis Date   Alcoholism [facility])    Arthritis    Atrial fibrillation ([place]) 1995   one episode   Basal cell carcinoma 04/26/2017   Right medial cheek. Superficial and nodular   Basal cell carcinoma 06/11/2019   Left anterior shoulder. Nodular pattern   Basal cell carcinoma 09/09/2019   Right nasal ala, EDC   Basal cell carcinoma 02/05/2020   L upper eyebrow, [facility]    Carotid arterial disease ([place])    a.  Jun 18, 2019 Carotid U/S: <50% bilat ICA stenoses.   Chicken pox    Colon cancer (HCC)    Colon polyp    Depression    Son died 06-17-14   Diverticulitis    Diverticulosis 30 years   Diverticulosis    Dysrhythmia    Gastritis    GERD (gastroesophageal reflux disease)    H. pylori infection    History of cerebral hemorrhage    History of echocardiogram    a. 18-Jun-2019 Echo: EF 55-60%, no rwma, triv MR/AI.   History of stress test    a. 09/2015 MV: No ischemia/infarct. EF 45-54% (nl by echo).   Hyperlipidemia    Hypertension    Irritable bowel syndrome    Prostate cancer ([place]) 17-Jun-2010   treated with radiation therapy. Prostate   PSVT (paroxysmal supraventricular tachycardia) ([place])    a. 06/2019 Zio: Avg rate 74 (54-120), occas PACs, rare PVCs, 125 episodes of PVCs (longest 17.5 secs; max rate 187). No afib.   Squamous cell carcinoma of skin 07/18/2017   Left medial calf. KA type   Squamous cell carcinoma of skin 04/24/2018   Right above med. brow   Squamous cell carcinoma of skin 06/11/2019   Right posterior shoulder. SCCis, hypertrophic   Squamous cell carcinoma of skin 01/09/2020   Mid nasal dorsum, MOHS, Efudex x 4wks   Stroke [facility])    Vasovagal syncope    Past Surgical History:  Procedure Laterality  Date   CARDIAC CATHETERIZATION  1998   [place] no stents   CATARACT EXTRACTION, BILATERAL     COLON RESECTION SIGMOID N/A 12/07/2016   Procedure: COLON RESECTION SIGMOID;  Surgeon: Clayburn Pert, MD;  Location: [facility] ORS;  Service: General;  Laterality: N/A;   COLON SURGERY  11/2016   Colostomy   COLONOSCOPY  2016   COLONOSCOPY WITH PROPOFOL N/A 05/30/2016   Procedure: COLONOSCOPY WITH PROPOFOL;  Surgeon: Lollie Sails, MD;  Location: [facility] ENDOSCOPY;  Service: Endoscopy;  Laterality: N/A;   COLOSTOMY Left 12/07/2016   Procedure: COLOSTOMY;  Surgeon: Clayburn Pert, MD;  Location: [facility] ORS;  Service: General;  Laterality: Left;   COLOSTOMY REVERSAL N/A 03/21/2017    Procedure: COLOSTOMY REVERSAL;  Surgeon: Clayburn Pert, MD;  Location: [facility] ORS;  Service: General;  Laterality: N/A;   COLOSTOMY TAKEDOWN N/A 03/21/2017   Procedure: LAPAROSCOPIC COLOSTOMY TAKEDOWN;  Surgeon: Clayburn Pert, MD;  Location: [facility] ORS;  Service: General;  Laterality: N/A;   CYSTOSCOPY WITH STENT PLACEMENT Bilateral 03/21/2017   Procedure: CYSTOSCOPY WITH LIGHTED STENT PLACEMENT;  Surgeon: Abbie Sons, MD;  Location: [facility] ORS;  Service: Urology;  Laterality: Bilateral;   ESOPHAGOGASTRODUODENOSCOPY     ESOPHAGOGASTRODUODENOSCOPY N/A 05/30/2016   Procedure: ESOPHAGOGASTRODUODENOSCOPY (EGD);  Surgeon: Lollie Sails, MD;  Location: [facility] ENDOSCOPY;  Service: Endoscopy;  Laterality: N/A;   ESOPHAGOGASTRODUODENOSCOPY N/A 01/14/2021   Procedure: ESOPHAGOGASTRODUODENOSCOPY (EGD);  Surgeon: Toledo, Benay Pike, MD;  Location: [facility] ENDOSCOPY;  Service: Gastroenterology;  Laterality: N/A;   ESOPHAGOGASTRODUODENOSCOPY (EGD) WITH PROPOFOL N/A 04/28/2021   Procedure: ESOPHAGOGASTRODUODENOSCOPY (EGD) WITH PROPOFOL;  Surgeon: Toledo, Benay Pike, MD;  Location: [facility] ENDOSCOPY;  Service: Gastroenterology;  Laterality: N/A;   EYE SURGERY     cataracts   FLEXIBLE SIGMOIDOSCOPY N/A 03/21/2017   Procedure: FLEXIBLE SIGMOIDOSCOPY;  Surgeon: Clayburn Pert, MD;  Location: [facility] ORS;  Service: General;  Laterality: N/A;   FRACTURE SURGERY Bilateral    right arm and left wrist   INCISION AND DRAINAGE ABSCESS N/A 12/07/2016   Procedure: DRAINAGE  OF INTRA ABDOMINAL ABSCESS;  Surgeon: Clayburn Pert, MD;  Location: [facility] ORS;  Service: General;  Laterality: N/A;   LAPAROTOMY N/A 12/07/2016   Procedure: EXPLORATORY LAPAROTOMY;  Surgeon: Clayburn Pert, MD;  Location: [facility] ORS;  Service: General;  Laterality: N/A;   PROSTATE SURGERY     Microwave therapy   TONSILLECTOMY     TOTAL KNEE ARTHROPLASTY Right 07/27/2016   Procedure: TOTAL KNEE ARTHROPLASTY;  Surgeon: [name], MD;  Location:  [facility] ORS;  Service: Orthopedics;  Laterality: Right;  Dr. Erlene Quan had to place Urinary catheter due to prostate cancer history.  Using flexible scope.   Social History:  reports that he quit smoking about 33 years ago. His smoking use included cigarettes. He has a 27.00 pack-year smoking history. His smokeless tobacco use includes chew. He reports current alcohol use of about 7.0 standard drinks per week. He reports that he does not use drugs.  Allergies  Allergen Reactions   Formaldehyde Rash    From shoes made with this material    Family History  Problem Relation Age of Onset   Lung cancer Father        smoker   Other Mother    Sudden death Son        due to "Blood clots"   Bipolar disorder Son    Heart disease Son    Kidney disease Daughter        congenital one small kidney   Prostate  cancer Neg Hx    Bladder Cancer Neg Hx     Prior to Admission medications   Medication Sig Start Date End Date Taking? Authorizing Provider  amLODipine (NORVASC) 5 MG tablet TAKE 1 TABLET BY MOUTH EVERY DAY 06/21/21   Bacigalupo, Dionne Bucy, MD  aspirin 81 MG EC tablet Take 81 mg by mouth daily.     [provider]  atorvastatin (LIPITOR) 40 MG tablet TAKE 1 TABLET BY MOUTH EVERY DAY 09/02/20   Bacigalupo, Dionne Bucy, MD  docusate sodium (COLACE) 100 MG capsule Take 100 mg by mouth daily as needed for mild constipation.    [provider]  fluticasone (FLONASE) 50 MCG/ACT nasal spray Place 1 spray into both nostrils daily.    [provider]  furosemide (LASIX) 20 MG tablet TAKE 1 TABLET BY MOUTH EVERY DAY 04/13/21   End, Harrell Gave, MD  Guaifenesin [facility] MAXIMUM STRENGTH) 1200 MG TB12 Take 1 tablet by mouth daily as needed.    [provider]  hydrocortisone 2.5 % cream APPLY TO AFFECTED AREA TWICE A DAY AS NEEDED FOR RASH 10/22/20   Bacigalupo, Dionne Bucy, MD  lisinopril (ZESTRIL) 40 MG tablet TAKE 1 TABLET BY MOUTH EVERY DAY 06/21/21   Bacigalupo, Dionne Bucy, MD   meclizine (ANTIVERT) 25 MG tablet Take 25 mg by mouth daily. 04/14/21   [provider]  metoprolol succinate (TOPROL-XL) 50 MG 24 hr tablet TAKE 1 TABLET BY MOUTH DAILY. TAKE WITH OR IMMEDIATELY FOLLOWING A MEAL. 03/04/21   [name], MD  Multiple Vitamin (MULTIVITAMIN WITH MINERALS) TABS tablet Take 1 tablet by mouth daily. Patient taking differently: Take 1 tablet by mouth daily. Mens daily 50+ 10/24/18   Vaughan Basta, MD  pantoprazole (PROTONIX) 40 MG tablet Take 40 mg by mouth daily.    [provider]  sertraline (ZOLOFT) 50 MG tablet Take 1 tablet (50 mg total) by mouth daily. 06/08/21   Mecum, Erin E, PA-C  sertraline (ZOLOFT) 50 MG tablet Take 1 tablet (50 mg total) by mouth daily. 06/21/21   Bacigalupo, Dionne Bucy, MD  traMADol (ULTRAM) 50 MG tablet Take 50 mg by mouth every 6 (six) hours as needed for severe pain.    [provider]  vitamin B-12 (CYANOCOBALAMIN) 1000 MCG tablet Take 1,000 mcg by mouth daily.    [provider]    Physical Exam: Vitals:   06/22/21 0730 06/22/21 0830 06/22/21 1000 06/22/21 1015  BP: (!) 145/82 (!) 153/78    Pulse: 90 93 93 94  Resp: 19 (!) 21 (!) 22 17  Temp:      TempSrc:      SpO2: 96% 97% 96% 97%  Weight:      Height:       Physical Exam Vitals and nursing note reviewed.  Constitutional:      Appearance: Normal appearance.  HENT:     Head: Normocephalic.     Nose: Nose normal.     Mouth/Throat:     Mouth: Mucous membranes are moist.  Eyes:     Comments: Pale conjunctiva  Cardiovascular:     Rate and Rhythm: Normal rate and regular rhythm.     Pulses: Normal pulses.  Pulmonary:     Effort: Pulmonary effort is normal.     Breath sounds: Normal breath sounds.  Abdominal:     General: Abdomen is flat. Bowel sounds are normal.     Palpations: Abdomen is soft.  Musculoskeletal:  General: Normal range of motion.     Cervical back: Normal range of motion and neck supple.   Skin:    General: Skin is warm and dry.  Neurological:     General: No focal deficit present.     Mental Status: He is alert and oriented to person, place, and time.  Psychiatric:        Mood and Affect: Mood normal.        Behavior: Behavior normal.    Data Reviewed: Relevant notes from primary care and specialist visits, past discharge summaries as available in EHR, including Care Everywhere. Prior diagnostic testing as pertinent to current admission diagnoses Updated medications and problem lists for reconciliation ED course, including vitals, labs, imaging, treatment and response to treatment Triage notes, nursing and pharmacy notes and ED provider's notes Notable results as noted in HPI Labs reviewed.  Hemoglobin 13.2 >> 10.4, Garfinkel count 18.7 CT angiogram of the abdomen and pelvis shows aortoiliac atherosclerosis. No flow-limiting stenosis or dissection is seen in the abdominal aorta and most of the branch arteries. There are nonstenosing calcifications in the branch arteries, except for a 50% calcific stenosis in the smaller of 2 left renal arteries. 4.1 cm aneurysmal prominence of the aortic root. 1.5 cm prominence of the common iliac arteries, with ectatic external and internal iliac arteries. No contrast extravasation into bowel is visible during the study. Evidence of transverse and descending colitis, likely infectious or inflammatory. Ischemic etiology considered less likely due to normal opacification of the SMA/IMA and SMV. There is no bowel pneumatosis. No free air or abscess. Nonlocalizing reactive fluid is seen in the left paracolic gutter. Renal cysts and scarring. Enlarged prostate, with fiducial markers. Twelve-lead EKG reviewed by me shows sinus rhythm with prolonged PR interval, LVH and right bundle branch block   There are no new results to review at this time.   Assessment and Plan: * Acute blood loss anemia Patient presents to the ER for evaluation of rectal  bleeding. He has a history of constipation and rectal bleeding may likely be secondary to hemorrhoidal bleed Patient has had a 3 g drop in his H&H Check serial H&H and transfuse for hemoglobin less than 7 IV fluid resuscitation We will request GI consult  Hematochezia Most likely related to bleeding hemorrhoids Will consult GI for further evaluation Check serial H&H and transfuse as needed  Alcohol use disorder, moderate, dependence ([place]) Patient with a history of alcohol use disorder We will place on CIWA protocol and administer lorazepam for CIWA score of 8 or greater Place patient on MVI, thiamine and folic acid  Depression, major, single episode, mild (HCC) Stable Continue sertraline  Essential hypertension Blood pressure is stable Continue lisinopril and metoprolol  Constipation Related to opioid use for pain control We will start patient on MiraLAX 17 g daily as well as senna 1 tablet daily      Advance Care Planning:   Code Status: Full Code   Consults: Gastroenterology  Family Communication: Greater than 50% of time was spent discussing patient's condition and plan of care with him at the bedside.  All questions and concerns have been addressed.  He verbalizes understanding and agrees with the plan.  Severity of Illness: The appropriate patient status for this patient is INPATIENT. Inpatient status is judged to be reasonable and necessary in order to provide the required intensity of service to ensure the patient's safety. The patient's presenting symptoms, physical exam findings, and initial radiographic and laboratory data in  the context of their chronic comorbidities is felt to place them at high risk for further clinical deterioration. Furthermore, it is not anticipated that the patient will be medically stable for discharge from the hospital within 2 midnights of admission.   * I certify that at the point of admission it is my clinical judgment that the patient  will require inpatient hospital care spanning beyond 2 midnights from the point of admission due to high intensity of service, high risk for further deterioration and high frequency of surveillance required.*  Author: Collier Bullock, MD 06/22/2021 10:56 AM  For on call review [email or web address].

## 2021-06-22 NOTE — ED Notes (Signed)
Pt given warm blankets at this time. ?

## 2021-06-22 NOTE — Assessment & Plan Note (Addendum)
Continue Zoloft 

## 2021-06-23 ENCOUNTER — Encounter: Payer: Self-pay | Admitting: Internal Medicine

## 2021-06-23 DIAGNOSIS — E669 Obesity, unspecified: Secondary | ICD-10-CM

## 2021-06-23 DIAGNOSIS — F1020 Alcohol dependence, uncomplicated: Secondary | ICD-10-CM

## 2021-06-23 DIAGNOSIS — D62 Acute posthemorrhagic anemia: Secondary | ICD-10-CM | POA: Diagnosis not present

## 2021-06-23 DIAGNOSIS — I10 Essential (primary) hypertension: Secondary | ICD-10-CM

## 2021-06-23 DIAGNOSIS — K529 Noninfective gastroenteritis and colitis, unspecified: Secondary | ICD-10-CM | POA: Diagnosis not present

## 2021-06-23 DIAGNOSIS — K625 Hemorrhage of anus and rectum: Secondary | ICD-10-CM | POA: Diagnosis not present

## 2021-06-23 DIAGNOSIS — K5150 Left sided colitis without complications: Secondary | ICD-10-CM | POA: Diagnosis present

## 2021-06-23 DIAGNOSIS — Z6830 Body mass index (BMI) 30.0-30.9, adult: Secondary | ICD-10-CM

## 2021-06-23 DIAGNOSIS — K51511 Left sided colitis with rectal bleeding: Secondary | ICD-10-CM | POA: Diagnosis not present

## 2021-06-23 LAB — BASIC METABOLIC PANEL
Anion gap: 6 (ref 5–15)
BUN: 11 mg/dL (ref 8–23)
CO2: 22 mmol/L (ref 22–32)
Calcium: 8.7 mg/dL — ABNORMAL LOW (ref 8.9–10.3)
Chloride: 107 mmol/L (ref 98–111)
Creatinine, Ser: 1.1 mg/dL (ref 0.61–1.24)
GFR, Estimated: >60 mL/min (ref >60)
Glucose, Bld: 116 mg/dL — ABNORMAL HIGH (ref 70–99)
Potassium: 3.5 mmol/L (ref 3.5–5.1)
Sodium: 135 mmol/L (ref 135–145)

## 2021-06-23 LAB — CBC
HCT: 32.7 % — ABNORMAL LOW (ref 39.0–52.0)
Hemoglobin: 10.4 g/dL — ABNORMAL LOW (ref 13.0–17.0)
MCH: 29.4 pg (ref 26.0–34.0)
MCHC: 31.8 g/dL (ref 30.0–36.0)
MCV: 92.4 fL (ref 80.0–100.0)
Platelets: 182 10*3/uL (ref 150–400)
RBC: 3.54 MIL/uL — ABNORMAL LOW (ref 4.22–5.81)
RDW: 13.2 % (ref 11.5–15.5)
WBC: 17 10*3/uL — ABNORMAL HIGH (ref 4.0–10.5)
nRBC: 0 % (ref 0.0–0.2)

## 2021-06-23 LAB — HEMOGLOBIN AND HEMATOCRIT, BLOOD
HCT: 29.8 % — ABNORMAL LOW (ref 39.0–52.0)
Hemoglobin: 9.4 g/dL — ABNORMAL LOW (ref 13.0–17.0)

## 2021-06-23 MED ORDER — GUAIFENESIN-DM 100-10 MG/5ML PO SYRP
5.0000 mL | ORAL_SOLUTION | ORAL | Status: DC | PRN
  Administered 2021-06-23: 5 mL via ORAL
  Filled 2021-06-23: qty 5

## 2021-06-23 MED ORDER — MELATONIN 5 MG PO TABS
10.0000 mg | ORAL_TABLET | Freq: Every evening | ORAL | Status: DC | PRN
  Administered 2021-06-23 – 2021-07-02 (×7): 10 mg via ORAL
  Filled 2021-06-23 (×8): qty 2

## 2021-06-23 MED ORDER — PIPERACILLIN-TAZOBACTAM 3.375 G IVPB
3.3750 g | Freq: Three times a day (TID) | INTRAVENOUS | Status: AC
Start: 2021-06-23 — End: 2021-06-27
  Administered 2021-06-23 – 2021-06-26 (×11): 3.375 g via INTRAVENOUS
  Filled 2021-06-23 (×11): qty 50

## 2021-06-23 NOTE — ED Notes (Addendum)
Pt brief and sheets changed at this time. Pt had one bloody, mucous bowel movement.  ?

## 2021-06-23 NOTE — H&P (Signed)
Cameron Foster , MD [address], [place], [place], [place], [address] [address], [place], [place], [place], [address] Phone: [phone]  Fax: [place]. is being followed for colitis  Day 1 of follow up   Subjective: Still having rectal bleeding not improved since yesterday.   Objective: Vital signs in last 24 hours: Vitals:   06/23/21 0744 06/23/21 0745 06/23/21 0800 06/23/21 0830  BP: 136/76 136/76 137/72 121/61  Pulse: 93 90 81 74  Resp:  20 16 15   Temp:      TempSrc:      SpO2:  96% 96% 96%  Weight:      Height:       Weight change:   Intake/Output Summary (Last 24 hours) at 06/23/2021 0881 Last data filed at 06/22/2021 1454 Gross per 24 hour  Intake 841.67 ml  Output --  Net 841.67 ml     Exam:  Abdomen: soft, nontender, normal bowel sounds   Lab Results: @LABTEST2 @ Micro Results: Recent Results (from the past 240 hour(s))  Surgical PCR Screen     Status: None   Collection Time: 06/16/21  3:55 PM   Specimen: Nasal Mucosa; Nasal Swab  Result Value Ref Range Status   MRSA, PCR NEGATIVE NEGATIVE Final   Staphylococcus aureus NEGATIVE NEGATIVE Final    Comment: (NOTE) The Xpert SA Assay (FDA approved for NASAL specimens in patients 68 years of age and older), is one component of a comprehensive surveillance program. It is not intended to diagnose infection nor to guide or monitor treatment. Performed at [facility], [address] [place], [place] [address]   Resp Panel by RT-PCR (Flu A&B, Covid) Nasopharyngeal Swab     Status: None   Collection Time: 06/22/21  4:44 AM   Specimen: Nasopharyngeal Swab; Nasopharyngeal(NP) swabs in vial transport medium  Result Value Ref Range Status   SARS Coronavirus 2 by RT PCR NEGATIVE NEGATIVE Final    Comment: (NOTE) SARS-CoV-2 target nucleic acids are NOT DETECTED.  The SARS-CoV-2 RNA is generally detectable in upper respiratory specimens during the acute phase of  infection. The lowest concentration of SARS-CoV-2 viral copies this assay can detect is 138 copies/mL. A negative result does not preclude SARS-Cov-2 infection and should not be used as the sole basis for treatment or other patient management decisions. A negative result may occur with  improper specimen collection/handling, submission of specimen other than nasopharyngeal swab, presence of viral mutation(s) within the areas targeted by this assay, and inadequate number of viral copies(<138 copies/mL). A negative result must be combined with clinical observations, patient history, and epidemiological information. The expected result is Negative.  Fact Sheet for Patients:  EntrepreneurPulse.com.au  Fact Sheet for Healthcare Providers:  IncredibleEmployment.be  This test is no t yet approved or cleared by the [place] FDA and  has been authorized for detection and/or diagnosis of SARS-CoV-2 by FDA under an Emergency Use Authorization (EUA). This EUA will remain  in effect (meaning this test can be used) for the duration of the COVID-19 declaration under Section 564(b)(1) of the Act, 21 U.S.C.section 360bbb-3(b)(1), unless the authorization is terminated  or revoked sooner.       Influenza A by PCR NEGATIVE NEGATIVE Final   Influenza B by PCR NEGATIVE NEGATIVE Final    Comment: (NOTE) The Xpert Xpress SARS-CoV-2/FLU/RSV plus assay is intended as an aid in the diagnosis of influenza from Nasopharyngeal swab specimens and should not be used as a  sole basis for treatment. Nasal washings and aspirates are unacceptable for Xpert Xpress SARS-CoV-2/FLU/RSV testing.  Fact Sheet for Patients: EntrepreneurPulse.com.au  Fact Sheet for Healthcare Providers: IncredibleEmployment.be  This test is not yet approved or cleared by the [place] FDA and has been authorized for detection and/or diagnosis of SARS-CoV-2  by FDA under an Emergency Use Authorization (EUA). This EUA will remain in effect (meaning this test can be used) for the duration of the COVID-19 declaration under Section 564(b)(1) of the Act, 21 U.S.C. section 360bbb-3(b)(1), unless the authorization is terminated or revoked.  Performed at [facility], [address] [place], [place] [address]   Blood culture (routine x 2)     Status: None (Preliminary result)   Collection Time: 06/22/21  4:44 AM   Specimen: BLOOD  Result Value Ref Range Status   Specimen Description BLOOD RIGHT [facility]  Final   Special Requests   Final    BOTTLES DRAWN AEROBIC AND ANAEROBIC Blood Culture adequate volume   Culture   Final    NO GROWTH 1 DAY Performed at [facility], [address], [place], [place] [address]    Report Status PENDING  Incomplete  Blood culture (routine x 2)     Status: None (Preliminary result)   Collection Time: 06/22/21  4:44 AM   Specimen: BLOOD  Result Value Ref Range Status   Specimen Description BLOOD LEFT AC  Final   Special Requests   Final    BOTTLES DRAWN AEROBIC AND ANAEROBIC Blood Culture adequate volume   Culture   Final    NO GROWTH 1 DAY Performed at [facility], [address] [place], [place] [address]    Report Status PENDING  Incomplete  Gastrointestinal Panel by PCR , Stool     Status: None   Collection Time: 06/22/21 11:51 AM   Specimen: Stool  Result Value Ref Range Status   Campylobacter species NOT DETECTED NOT DETECTED Final   Plesimonas shigelloides NOT DETECTED NOT DETECTED Final   Salmonella species NOT DETECTED NOT DETECTED Final   Yersinia enterocolitica NOT DETECTED NOT DETECTED Final   Vibrio species NOT DETECTED NOT DETECTED Final   Vibrio cholerae NOT DETECTED NOT DETECTED Final   Enteroaggregative E coli (EAEC) NOT DETECTED NOT DETECTED Final   Enteropathogenic E coli (EPEC) NOT DETECTED NOT DETECTED Final   Enterotoxigenic E coli (ETEC) NOT DETECTED NOT  DETECTED Final   Shiga like toxin producing E coli (STEC) NOT DETECTED NOT DETECTED Final   Shigella/Enteroinvasive E coli (EIEC) NOT DETECTED NOT DETECTED Final   Cryptosporidium NOT DETECTED NOT DETECTED Final   Cyclospora cayetanensis NOT DETECTED NOT DETECTED Final   Entamoeba histolytica NOT DETECTED NOT DETECTED Final   Giardia lamblia NOT DETECTED NOT DETECTED Final   Adenovirus F40/41 NOT DETECTED NOT DETECTED Final   Astrovirus NOT DETECTED NOT DETECTED Final   Norovirus GI/GII NOT DETECTED NOT DETECTED Final   Rotavirus A NOT DETECTED NOT DETECTED Final   Sapovirus (I, II, IV, and V) NOT DETECTED NOT DETECTED Final    Comment: Performed at [facility], [address] [place], [place] [address]   Studies/Results: CT Angio Abd/Pel W and/or Wo Contrast  Result Date: 06/22/2021 CLINICAL DATA:  Lower GI bleed. Previous history of sigmoid colectomy and primary anastomosis. EXAM: CTA ABDOMEN AND PELVIS WITHOUT AND WITH CONTRAST TECHNIQUE: Multidetector CT imaging of the abdomen and pelvis was performed using the standard protocol during bolus administration of intravenous contrast. Multiplanar reconstructed images  and MIPs were obtained and reviewed to evaluate the vascular anatomy. RADIATION DOSE REDUCTION: This exam was performed according to the departmental dose-optimization program which includes automated exposure control, adjustment of the mA and/or kV according to patient size and/or use of iterative reconstruction technique. CONTRAST:  177m OMNIPAQUE IOHEXOL 350 MG/ML SOLN COMPARISON:  Abdomen and pelvis CTs both with contrast, 01/13/2021 and 03/14/2020 FINDINGS: VASCULAR Aorta: There is moderate calcific atherosclerosis and slight tortuosity. There is no penetrating ulcer, dissection, stenosis or aneurysm. Celiac: There are scattered calcifications in the celiac axis and branch arteries but no flow-limiting stenosis, dissection or aneurysm. SMA: There are scattered patchy  calcifications without flow-limiting stenosis, aneurysm or dissection. Renals: There is a duplicated left renal artery, the more posterior of which is dominant and both branching from the aorta at a similar level. The dominant artery demonstrates scattered calcification without stenosis or dissection. The nondominant, smaller artery demonstrates a 50% calcific origin stenosis and otherwise appears patent. There is a single right renal artery with nonstenosing ostial calcifications, and scattered trace nonstenosing calcifications more distally. There is no flow-limiting stenosis, aneurysm or dissection. IMA: Patent without evidence of aneurysm, dissection, vasculitis or significant stenosis. Inflow: There is moderate to heavy calcification in the common iliac arteries which both measure slightly aneurysmal at 1.5 cm each. There is additional calcification also moderate to heavy in the external iliac arteries, with symmetric mild ectasia without stenosis, dissection or aneurysm. There are patchy calcifications in the internal iliac arteries without stenosis or dissection, with 12 mm ectasia of the proximal left internal iliac artery, 10 mm ectasia of the proximal right internal iliac artery. Proximal Outflow: Patchy calcifications are noted without flow-limiting stenosis in the common femoral arteries, deep and circumflex femoral arteries and proximal superficial femoral arteries. Veins: No obvious venous abnormality within the limitations of this arterial phase study. Review of the MIP images confirms the above findings. NON-VASCULAR Lower chest: Lung bases are clear of infiltrate, with mild elevation right hemidiaphragm. There is mild cardiomegaly with three-vessel calcific CAD, calcification in the aortic valve and 4.1 cm aneurysmal change in the aortic root. Hepatobiliary: 20.1 cm in length mildly steatotic liver. There is a small cyst about the liver hilum, unchanged. No mass enhancement. Pancreas: Unremarkable.  Spleen: No mass enhancement or splenomegaly. Adrenals/Urinary Tract: Stable bilateral renal cysts, largest on the left in the midpole measuring 5.8 cm, largest on the right is in the upper pole measuring 3.1 cm. There is a wedge-shaped cortical scar chronically in the inferior pole left kidney. There is no adrenal mass. No urinary stone or obstruction. No significant bladder thickening accounting for partial contraction. Stomach/Bowel: Small hiatal hernia. The stomach, unopacified small bowel are unremarkable with normal caliber appendix. There is moderate wall thickening with surrounding inflammatory reaction in the mid/distal transverse and descending colon, with scattered mucosal surface enhancement. There are uncomplicated sigmoid diverticula and evidence of prior sigmoid colectomy and primary surgical anastomosis. There is fluid or blood collecting in the rectum without wall thickening seen past the descending/sigmoid junction. There is no wall pneumatosis, abscess or free air. Lymphatic: No lymphadenopathy is seen. Reproductive: Prostate again noted enlarged 5 cm transverse with fiducial markers in the prostate as before. Other: There is a small amount of free fluid in the left paracolic gutter likely reactive. No localizing collection is seen. There is no free air, hemorrhage or abscess. There are small inguinal fat hernias. Musculoskeletal: There is osteopenia, with advanced degenerative disc changes in the lumbar spine at L2-3, L3-4  and L4-5, with spondylosis and facet hypertrophy. Ankylosis both SI joints. Asymmetrically advanced DJD right hip. IMPRESSION: VASCULAR 1. Aortoiliac atherosclerosis. No flow-limiting stenosis or dissection is seen in the abdominal aorta and most of the branch arteries. There are nonstenosing calcifications in the branch arteries, except for a 50% calcific stenosis in the smaller of 2 left renal arteries. 2. 4.1 cm aneurysmal prominence of the aortic root. Annual CTA or MRA  follow-up recommended with routine baseline nonemergent CTA chest recommended since the last chest CT was 03/14/2020. 3. 1.5 cm prominence of the common iliac arteries, with ectatic external and internal iliac arteries. 4. No contrast extravasation into bowel is visible during the study. NON-VASCULAR 1. Evidence of transverse and descending colitis, likely infectious or inflammatory. Ischemic etiology considered less likely due to normal opacification of the SMA/IMA and SMV. There is no bowel pneumatosis. No free air or abscess. Nonlocalizing reactive fluid is seen in the left paracolic gutter. 2. Renal cysts and scarring. 3. Enlarged prostate, with fiducial markers. 4. Remaining findings described above. Electronically Signed   By: Telford Nab M.D.   On: 06/22/2021 03:47   Medications: I have reviewed the patient's current medications. Scheduled Meds:  atorvastatin  40 mg Oral [id number]   folic acid  1 mg Oral Daily   furosemide  20 mg Oral Daily   lisinopril  40 mg Oral Daily   LORazepam  0-4 mg Oral Q6H   Followed by   Derrill Memo ON 06/24/2021] LORazepam  0-4 mg Oral Q12H   meclizine  25 mg Oral Daily   metoprolol succinate  50 mg Oral Q breakfast   multivitamin with minerals  1 tablet Oral Daily   multivitamin with minerals  1 tablet Oral Daily   pantoprazole  40 mg Oral Daily   polyethylene glycol  17 g Oral Daily   senna  1 tablet Oral Daily   sertraline  50 mg Oral Daily   sodium chloride flush  3 mL Intravenous Q12H   thiamine  100 mg Oral Daily   Or   thiamine  100 mg Intravenous Daily   vitamin B-12  1,000 mcg Oral Daily   Continuous Infusions:  sodium chloride 100 mL/hr at 06/23/21 0549   sodium chloride     PRN Meds:.sodium chloride, acetaminophen **OR** acetaminophen, fluticasone, guaiFENesin-dextromethorphan, LORazepam **OR** LORazepam, ondansetron **OR** ondansetron (ZOFRAN) IV, sodium chloride flush   Assessment: Principal Problem:   Acute blood loss anemia Active  Problems:   Constipation   Essential hypertension   Depression, major, single episode, mild (HCC)   Obesity   Hyperlipidemia LDL goal <70   Alcohol use disorder, moderate, dependence (HCC)   Hematochezia  CBC Latest Ref Rng & Units 06/23/2021 06/22/2021 06/22/2021  WBC 4.0 - 10.5 K/uL - - -  Hemoglobin 13.0 - 17.0 g/dL 9.4(L) 11.0(L) 10.4(L)  Hematocrit 39.0 - 52.0 % 29.8(L) 35.7(L) 32.6(L)  Platelets 150 - 400 K/uL - - -      Cameron Foster. is a 81 y.o. y/o male with history of alcohol abuse admitted with history of constipation with subsequent straining and bright red blood per rectum.  CT angiogram showed colitis of the left side of the colon .  Differentials include infectious versus inflammatory colitis.  Hemoglobin dropped from 11---> 9.4 grams    Plan 1.  Monitor CBC and transfuse as needed. 2.  Would have expected the rectal bleeding to decrease in quantity.  Continues with no improvement.  We will plan for flexible sigmoidoscopy  tomorrow   I have discussed alternative options, risks & benefits,  which include, but are not limited to, bleeding, infection, perforation,respiratory complication & drug reaction.  The patient agrees with this plan & written consent will be obtained.       LOS: 1 day   Cameron Bellows, MD 06/23/2021, 9:52 AM

## 2021-06-23 NOTE — Progress Notes (Signed)
Pharmacy Antibiotic Note ? ?Cameron Foster. is a 81 y.o. male presenting with rectal bleeding, admitted on 06/22/2021 with  intra-abdominal infection .  PMH includes diverticulosis, previous GIB. Pharmacy has been consulted for piperacillin/tazobactam dosing. ? ?CT abdomen with evidence of transverse and descending colitis, likely infectious ?or inflammatory. WBC elevated. Has been afebrile.  ? ?Plan:  ?Start piperacillin/tazobactam 3.375 grams every 8 hours (4 hour infusion) ? ?Monitor cultures, renal function, length of therapy. ? ?Height: 5' 8"  (172.7 cm) ?Weight: 90.7 kg (200 lb) ?IBW/kg (Calculated) : 68.4 ? ?Temp (24hrs), Avg:98.8 ?F (37.1 ?C), Min:98.6 ?F (37 ?C), Max:98.9 ?F (37.2 ?C) ? ?Recent Labs  ?Lab 06/16/21 ?1555 06/21/21 ?2035 06/22/21 ?0444 06/22/21 ?0901 06/23/21 ?8682 06/23/21 ?1429  ?WBC 7.2 18.7*  --   --   --  17.0*  ?CREATININE 1.20 1.15  --   --  1.10  --   ?LATICACIDVEN  --   --  1.1 1.2  --   --   ?  ?Estimated Creatinine Clearance: 58.6 mL/min (by C-G formula based on SCr of 1.1 mg/dL).   ? ?Allergies  ?Allergen Reactions  ? Formaldehyde Rash  ?  From shoes made with this material  ? ? ?Antimicrobials this admission: ?Zosyn 3/8 >>  ? ? ?Dose adjustments this admission: ?N/a ? ?Microbiology results: ?3/7 BCx: NG x 1d ?3/7 GI panel: not detected  ? ?Thank you for allowing pharmacy to be a part of this patient?s care. ? ?Wynelle Cleveland, PharmD ?Pharmacy Resident  ?06/23/2021 ?4:09 PM ? ? ?

## 2021-06-23 NOTE — Progress Notes (Signed)
[facility] Progress Note ? ?Patient: Cameron Foster.    OZH:[id number]  DOA: 06/22/2021    ?Date of Service: the patient was seen and examined on 06/23/2021 ? ?Brief hospital course: ?81 year old male past medical history of alcoholism, atrial fibrillation previous GI bleed from pyloric ulcer as well as diverticulosis presented to the emergency room on 3/7 with complaints of rectal bleeding/bright red blood per rectum and hemoglobin noted to be 10.4 (baseline of 13.2).  Admitted to the hospitalist service & GI consulted with plans for flexible sigmoidoscopy on 3/9.  CT angiogram noted left-sided colitis felt to be infectious versus inflammatory. ? ?Hemoglobin today at 9.4. ? ?Assessment and Plan: ?Assessment and Plan: ?* Left sided colitis ([place]) ?Ischemic versus infectious.  Given persistently elevated [place] blood cell count, will start antibiotics.  Hemoglobin notes improvement from previous day, which is reassuring. ? ?Hematochezia ?Most likely related to bleeding hemorrhoids ?Will consult GI for further evaluation ?Check serial H&H and transfuse as needed ? ?Acute blood loss anemia ?Felt to be secondary to left-sided colitis.  Infectious favored over inflammatory.  Continue to monitor closely, awaiting cultures.  Hemoglobin improved from 12 hours ago.  No need for transfusion at this point. ? ?Essential hypertension ?Blood pressure is stable ?Continue lisinopril and metoprolol ? ?Depression, major, single episode, mild ([place]) ?Continue Zoloft. ? ?Alcohol use disorder, moderate, dependence ([place]) ?Patient with a history of alcohol use disorder ?We will place on CIWA protocol and administer lorazepam for CIWA score of 8 or greater ?Place patient on MVI, thiamine and folic acid ? ?Obesity ?Meets criteria with BMI greater than 30 ? ?Hyperlipidemia LDL goal <70 ?Continue statin ? ?Constipation ?Related to opioid use for pain control ?We will start patient on MiraLAX 17 g daily as well as senna 1 tablet  daily ? ?Osteoarthritis of hip ?Patient has plans for arthroplasty on Monday, 3/13.  We will plan to keep orthopedic surgery in the loop.  It is possible he still may be able to have surgery as scheduled depending on when he is discharged and his overall stability. ? ? ? ? ?Body mass index is 30.41 kg/m?.  ?  ?   ? ?Consultants: ?Gastroenterology ? ?Procedures: ?Plan flexible sigmoidoscopy 3/9 ? ?Antimicrobials: ?IV cefepime 3/8-present ? ?Code Status: Full code ? ? ?Subjective: Patient feeling better, still some mild abdominal discomfort ? ?Objective: ?Vital signs were reviewed and unremarkable. ?Vitals:  ? 06/23/21 1230 06/23/21 1532  ?BP: 135/74 (!) 145/78  ?Pulse: 82 89  ?Resp: (!) 23 16  ?Temp:  98.6 ?F (37 ?C)  ?SpO2: 97% 100%  ? ?No intake or output data in the 24 hours ending 06/23/21 1534 ?Filed Weights  ? 06/21/21 2033  ?Weight: 90.7 kg  ? ?Body mass index is 30.41 kg/m?. ? ?Exam: ? ?General: Alert and oriented x3, no acute distress ?HEENT: Normocephalic and atraumatic, mucous membranes slightly dry ?Cardiovascular: Regular rate and rhythm, S1-S2 ?Respiratory: Clear to auscultation bilaterally ?Abdomen: Soft, nontender, nondistended, positive bowel sounds ?Musculoskeletal: No clubbing or cyanosis, 1+ pitting edema bilaterally ?Skin: No skin breaks, tears or lesions ?Psychiatry: Appropriate, no evidence of psychoses ?Neurology: No focal deficits ? ?Data Reviewed: ?Improvement in hemoglobin, currently at 10.4 ? ?Disposition:  ?Status is: Inpatient ?Remains inpatient appropriate because: Need for flexible sigmoidoscopy, stabilization of bleeding ?  ? ?Family Communication: Sister and wife at the bedside ?DVT Prophylaxis: ?SCDs Start: 06/22/21 1025 ? ? ? ?Author: ?Annita Brod ,MD ?06/23/2021 3:34 PM ? ?To reach On-call, see care teams to locate  the attending and reach out via [email or web address]. ?Between 7PM-7AM, please contact night-coverage ?If you still have difficulty reaching the attending provider, please  page the [facility] (Director on Call) for [facility] on amion for assistance. ? ?

## 2021-06-23 NOTE — Assessment & Plan Note (Signed)
Meets criteria with BMI greater than 30 ?

## 2021-06-23 NOTE — Assessment & Plan Note (Addendum)
Patient had plans for arthroplasty on Monday, 3/13.  Discussed with patient.  Given now new onset atrial fibrillation, would favor postponing surgery for a few weeks. ?

## 2021-06-23 NOTE — Assessment & Plan Note (Addendum)
Ischemic versus infectious.  Given persistently elevated [place] blood cell count, started on antibiotics on 3/8.  Hemoglobin remained stable.  Flexible sigmoidoscopy notes possible ischemic colitis.  Doing well, [place] count almost normalized.

## 2021-06-23 NOTE — Evaluation (Signed)
Physical Therapy Evaluation ?Patient Details ?Name: Cameron Foster. ?MRN: [id number] ?DOB: May 20, 1940 ?Today's Date: 06/23/2021 ? ?History of Present Illness ? Cameron Foster. is a 81 y.o. male with medical history of chronic alcohol use, paroxysmal A-fib not on anticoagulation, CVA, hypertension, history of upper GI bleed secondary to pyloric ulcer who presents with bright red blood per rectum. ?  ?Clinical Impression ? Pt admitted with above diagnosis. Pt received supine in bed agreeable to PT services. Pt reports at baseline pt is mod-I with ADL's/IADL's and ambulation with SPC. He and his wife receive meals on wheels. Pt resting on 3 L/min [place] with Spo2 >95% at rest and with mobility. Pt requiring modA at torso to attain sitting at EOB. Static sitting appreciated with supervision and BUE support and able to stand and ambulate without AD within room. Pt displaying significant post lean in standing with weight shifted onto heels but able to correct with VC's. Pt ambulating 12' in room with moderate unsteadiness with x2 bouts required of PT minA to correct LOB. Pt ambulates with very short, shuffling steps with narrowed BOS causing imbalance with gait. Pt seated EOB and able to transfer to supine with MaxA at LE's. All needs in reach. Pt displaying deficits in bed mobility, balance and gait impairments with muscle weakness placing pt at a high risk of falls preventing pt from safe d/c back home. Pt currently with functional limitations due to the deficits listed below (see PT Problem List). Pt will benefit from skilled PT to increase their independence and safety with mobility to allow discharge to the venue listed below.     ? ?Recommendations for follow up therapy are one component of a multi-disciplinary discharge planning process, led by the attending physician.  Recommendations may be updated based on patient status, additional functional criteria and insurance authorization. ? ?Follow Up Recommendations  Skilled nursing-short term rehab (<3 hours/day) ? ?  ?Assistance Recommended at Discharge Intermittent Supervision/Assistance  ?Patient can return home with the following ? A little help with walking and/or transfers;A little help with bathing/dressing/bathroom;Assistance with cooking/housework;Assist for transportation ? ?  ?Equipment Recommendations Rolling walker (2 wheels)  ?Recommendations for Other Services ?    ?  ?Functional Status Assessment Patient has had a recent decline in their functional status and demonstrates the ability to make significant improvements in function in a reasonable and predictable amount of time.  ? ?  ?Precautions / Restrictions Precautions ?Precautions: Fall ?Restrictions ?Weight Bearing Restrictions: No  ? ?  ? ?Mobility ? Bed Mobility ?Overal bed mobility: Needs Assistance ?Bed Mobility: Supine to Sit, Sit to Supine ?  ?  ?Supine to sit: Mod assist ?Sit to supine: Max assist ?  ?General bed mobility comments: MOdA at torso to attain seated EOB. MaxA at LE's to return to supine ?Patient Response: Cooperative ? ?Transfers ?Overall transfer level: Needs assistance ?Equipment used: None ?Transfers: Sit to/from Stand ?Sit to Stand: Min guard ?  ?  ?  ?  ?  ?General transfer comment: Weight shift posteriorly on heels relying on bed to maintain standing ?  ? ?Ambulation/Gait ?Ambulation/Gait assistance: Min guard ?Gait Distance (Feet): 12 Feet ?Assistive device: None ?Gait Pattern/deviations: Steppage, Shuffle, Narrow base of support ?  ?  ?  ?General Gait Details: Small shuffling steps performed within room ? ?Stairs ?  ?  ?  ?  ?  ? ?Wheelchair Mobility ?  ? ?Modified Rankin (Stroke Patients Only) ?  ? ?  ? ?Balance Overall balance assessment:  Needs assistance ?Sitting-balance support: Bilateral upper extremity supported, Feet supported ?Sitting balance-Leahy Scale: Fair ?  ?  ?Standing balance support: No upper extremity supported, During functional activity ?Standing balance-Leahy  Scale: Poor ?Standing balance comment: Weight shifted on heels leading to post lean in static standing ?  ?  ?  ?  ?  ?  ?  ?  ?  ?  ?  ?   ? ? ? ?Pertinent Vitals/Pain Pain Assessment ?Pain Assessment: No/denies pain  ? ? ?Home Living Family/patient expects to be discharged to:: Private residence ?Living Arrangements: Spouse/significant other ?Available Help at Discharge: Family;Available 24 hours/day ?Type of Home: House ?Home Access: Level entry ?  ?  ?  ?Home Layout: One level ?Home Equipment: Kasandra Knudsen - single point ?Additional Comments: Mod-I with SPC  ?  ?Prior Function Prior Level of Function : Independent/Modified Independent ?  ?  ?  ?  ?  ?  ?  ?ADLs Comments: Receives meals on wheels ?  ? ? ?Hand Dominance  ?   ? ?  ?Extremity/Trunk Assessment  ? Upper Extremity Assessment ?Upper Extremity Assessment: Generalized weakness ?  ? ?Lower Extremity Assessment ?Lower Extremity Assessment: Generalized weakness ?  ? ?   ?Communication  ? Communication: No difficulties  ?Cognition Arousal/Alertness: Awake/alert ?Behavior During Therapy: [facility] for tasks assessed/performed ?Overall Cognitive Status: Within Functional Limits for tasks assessed ?  ?  ?  ?  ?  ?  ?  ?  ?  ?  ?  ?  ?  ?  ?  ?  ?  ?  ?  ? ?  ?General Comments   ? ?  ?Exercises Other Exercises ?Other Exercises: Role of PT in acute setting, d/c recs  ? ?Assessment/Plan  ?  ?PT Assessment Patient needs continued PT services  ?PT Problem List Decreased strength;Decreased mobility;Decreased safety awareness;Decreased activity tolerance;Decreased balance ? ?   ?  ?PT Treatment Interventions DME instruction;Therapeutic exercise;Gait training;Balance training;Stair training;Neuromuscular re-education;Functional mobility training;Therapeutic activities;Patient/family education   ? ?PT Goals (Current goals can be found in the Care Plan section)  ?Acute Rehab PT Goals ?Patient Stated Goal: to get stronger before going home ?PT Goal Formulation: With patient ?Time For  Goal Achievement: 07/07/21 ?Potential to Achieve Goals: Fair ? ?  ?Frequency Min 2X/week ?  ? ? ?Co-evaluation   ?  ?  ?  ?  ? ? ?  ?AM-PAC PT "6 Clicks" Mobility  ?Outcome Measure Help needed turning from your back to your side while in a flat bed without using bedrails?: A Little ?Help needed moving from lying on your back to sitting on the side of a flat bed without using bedrails?: A Lot ?Help needed moving to and from a bed to a chair (including a wheelchair)?: A Little ?Help needed standing up from a chair using your arms (e.g., wheelchair or bedside chair)?: A Little ?Help needed to walk in hospital room?: A Lot ?Help needed climbing 3-5 steps with a railing? : A Lot ?6 Click Score: 15 ? ?  ?End of Session Equipment Utilized During Treatment: Gait belt;Oxygen ?Activity Tolerance: Patient limited by fatigue ?Patient left: in bed;with call bell/phone within reach ?Nurse Communication: Mobility status ?PT Visit Diagnosis: Unsteadiness on feet (R26.81);Difficulty in walking, not elsewhere classified (R26.2);Other abnormalities of gait and mobility (R26.89);Muscle weakness (generalized) (M62.81) ?  ? ?Time: 7793-9030 ?PT Time Calculation (min) (ACUTE ONLY): 25 min ? ? ?Charges:   PT Evaluation ?$PT Eval Moderate Complexity: 1 Mod ?PT Treatments ?$Gait Training:  8-22 mins ?  ?   ? ?Salem Caster. Fairly IV, PT, DPT ?Physical Therapist- [place]  ?[facility]  ?06/23/2021, 1:44 PM ? ?

## 2021-06-23 NOTE — Assessment & Plan Note (Signed)
Continue statin. 

## 2021-06-23 NOTE — ED Notes (Signed)
RN notified provider of pt family concerns. Provider sts that he will be at pt bedside shortly. ?

## 2021-06-23 NOTE — Hospital Course (Addendum)
81 year old male past medical history of alcoholism, atrial fibrillation previous GI bleed from pyloric ulcer as well as diverticulosis presented to the emergency room on 3/7 with complaints of rectal bleeding/bright red blood per rectum and hemoglobin noted to be 10.4 (baseline of 13.2).  Admitted to the hospitalist service & GI consulted. CT angiogram noted left-sided colitis felt to be infectious versus inflammatory.  Hemoglobin remained stable and actually improved to 11.1 x 3/10.  However, on early morning of 3/10, patient went into new onset rapid atrial fibrillation.  Heart rate improved although still not fully controlled.  Remaining in atrial fibrillation.  Echocardiogram done noted mild decrease ejection fraction of 45-50%.  Patient slightly volume overloaded and received IV Lasix.  On evening of 3/11, started going into persistent rapid atrial fibrillation, so patient started on Cardizem drip.  Patient had some chest discomfort on the early morning of 3/12.  Troponin unremarkable.  Chest pain resolved on its own without morphine or nitroglycerin.  EKG noted atrial fibrillation with right bundle branch block and left anterior fascicular block.  Both branch blocks are unchanged from previous EKGs prior to hospitalization.

## 2021-06-23 NOTE — ED Notes (Signed)
Patient A&Ox4, not impulsive at this time, NAD. ? ? ? 06/23/21 1200  ?CIWA-Ar  ?Pulse Rate 86  ?Nausea and Vomiting 0  ?Tactile Disturbances 0  ?Tremor 0  ?Auditory Disturbances 0  ?Paroxysmal Sweats 0  ?Visual Disturbances 0  ?Anxiety 0  ?Headache, Fullness in Head 0  ?Agitation 0  ?Orientation and Clouding of Sensorium 0  ?CIWA-Ar Total 0  ? ? ?

## 2021-06-23 NOTE — Evaluation (Signed)
Occupational Therapy Evaluation ?Patient Details ?Name: Cameron Foster. ?MRN: [id number] ?DOB: [birth year] ?Today's Date: 06/23/2021 ? ? ?History of Present Illness Cameron Foster. is a 81 y.o. male with medical history of chronic alcohol use, paroxysmal A-fib not on anticoagulation, CVA, hypertension, history of upper GI bleed secondary to pyloric ulcer who presents with bright red blood per rectum.  ? ?Clinical Impression ?  ?Patient presenting with decreased Ind in self care,balance, functional mobility/transfers, endurance, and safety awareness. Patient reports living at home with wife and utilizing cane for functional mobility. He does endorse being independent in self care, IADLs, and driving at baseline. Prior to OT arrival, pt having just worked with PT and reports fatigue. He declined OOB activities. He did engage in face washing with set up A and bed mobility with mod - max A. Pt needing assistance to call and order lunch tray while therapist in room. Patient will benefit from acute OT to increase overall independence in the areas of ADLs, functional mobility, and safety awareness in order to safely discharge to next venue of care.  ?   ? ?Recommendations for follow up therapy are one component of a multi-disciplinary discharge planning process, led by the attending physician.  Recommendations may be updated based on patient status, additional functional criteria and insurance authorization.  ? ?Follow Up Recommendations ? Skilled nursing-short term rehab (<3 hours/day)  ?  ?Assistance Recommended at Discharge Frequent or constant Supervision/Assistance  ?Patient can return home with the following A lot of help with walking and/or transfers;A lot of help with bathing/dressing/bathroom;Assistance with cooking/housework;Help with stairs or ramp for entrance;Assist for transportation ? ?  ?Functional Status Assessment ? Patient has had a recent decline in their functional status and demonstrates the ability  to make significant improvements in function in a reasonable and predictable amount of time.  ?Equipment Recommendations ? Other (comment) (defer to next venue of care)  ?  ?   ?Precautions / Restrictions Precautions ?Precautions: Fall ?Restrictions ?Weight Bearing Restrictions: No  ? ?  ? ?Mobility Bed Mobility ?Overal bed mobility: Needs Assistance ?Bed Mobility: Supine to Sit, Sit to Supine ?  ?  ?Supine to sit: Mod assist ?Sit to supine: Max assist ?  ?  ?  ? ?Transfers ?  ?  ?  ?  ?  ?  ?  ?  ?  ?General transfer comment: Pt declined ?  ? ?  ?Balance Overall balance assessment: Needs assistance ?Sitting-balance support: Bilateral upper extremity supported, Feet supported ?Sitting balance-Leahy Scale: Fair ?  ?  ?  ?  ?  ?  ?  ?  ?  ?  ?  ?  ?  ?  ?  ?  ?   ? ?ADL either performed or assessed with clinical judgement  ? ?ADL Overall ADL's : Needs assistance/impaired ?  ?  ?Grooming: Wash/dry hands;Wash/dry face;Bed level;Set up;Supervision/safety ?  ?  ?  ?  ?  ?  ?  ?  ?  ?  ?  ?  ?  ?  ?  ?  ?   ? ? ? ?Vision Patient Visual Report: No change from baseline ?   ?   ?   ?   ? ?Pertinent Vitals/Pain Pain Assessment ?Pain Assessment: No/denies pain  ? ? ? ?Hand Dominance Right ?  ?Extremity/Trunk Assessment Upper Extremity Assessment ?Upper Extremity Assessment: Generalized weakness ?  ?Lower Extremity Assessment ?Lower Extremity Assessment: Generalized weakness ?  ?  ?  ?Communication Communication ?  Communication: No difficulties ?  ?Cognition Arousal/Alertness: Awake/alert ?Behavior During Therapy: [facility] for tasks assessed/performed ?Overall Cognitive Status: Within Functional Limits for tasks assessed ?  ?  ?  ?  ?  ?  ?  ?  ?  ?  ?  ?  ?  ?  ?  ?  ?  ?  ?  ?   ?   ?   ? ? ?Home Living Family/patient expects to be discharged to:: Private residence ?Living Arrangements: Spouse/significant other ?Available Help at Discharge: Family;Available 24 hours/day ?Type of Home: House ?Home Access: Level entry ?  ?  ?Home  Layout: One level ?  ?  ?Bathroom Shower/Tub: Walk-in shower ?  ?Bathroom Toilet: Handicapped height ?Bathroom Accessibility: Yes ?  ?Home Equipment: Kasandra Knudsen - single point ?  ?Additional Comments: Mod-I with SPC ?  ? ?  ?Prior Functioning/Environment Prior Level of Function : Independent/Modified Independent ?  ?  ?  ?  ?  ?  ?  ?ADLs Comments: Pt reports Ind in self care tasks, IADLs, and driving. ?  ? ?  ?  ?OT Problem List: Decreased strength;Decreased activity tolerance;Impaired balance (sitting and/or standing);Decreased safety awareness;Cardiopulmonary status limiting activity;Decreased knowledge of use of DME or AE ?  ?   ?OT Treatment/Interventions: Self-care/ADL training;Therapeutic exercise;Therapeutic activities;DME and/or AE instruction;Manual therapy;Balance training;Patient/family education  ?  ?OT Goals(Current goals can be found in the care plan section) Acute Rehab OT Goals ?Patient Stated Goal: to get stronger and go hoome ?OT Goal Formulation: With patient ?Time For Goal Achievement: 07/07/21 ?Potential to Achieve Goals: Good ?ADL Goals ?Pt Will Perform Grooming: with supervision;standing ?Pt Will Perform Lower Body Dressing: with min assist;sit to/from stand ?Pt Will Transfer to Toilet: with min assist;ambulating ?Pt Will Perform Toileting - Clothing Manipulation and hygiene: with min assist;sit to/from stand  ?OT Frequency: Min 2X/week ?  ? ?   ?AM-PAC OT "6 Clicks" Daily Activity     ?Outcome Measure Help from another person eating meals?: None ?Help from another person taking care of personal grooming?: None ?Help from another person toileting, which includes using toliet, bedpan, or urinal?: A Lot ?Help from another person bathing (including washing, rinsing, drying)?: A Lot ?Help from another person to put on and taking off regular upper body clothing?: A Little ?Help from another person to put on and taking off regular lower body clothing?: A Lot ?6 Click Score: 17 ?  ?End of Session Nurse  Communication: Mobility status ? ?Activity Tolerance: Patient tolerated treatment well ?Patient left: with call bell/phone within reach;with bed alarm set;in bed ? ?OT Visit Diagnosis: Unsteadiness on feet (R26.81);Muscle weakness (generalized) (M62.81)  ?              ?Time: 1101-1120 ?OT Time Calculation (min): 19 min ?Charges:  OT General Charges ?$OT Visit: 1 Visit ?OT Evaluation ?$OT Eval Moderate Complexity: 1 Mod ?OT Treatments ?$Therapeutic Activity: 8-22 mins ? ?Darleen Crocker, [place], OTR/L , CBIS ?ascom [phone]  ?06/23/21, 2:25 PM  ?

## 2021-06-24 ENCOUNTER — Inpatient Hospital Stay: Payer: Medicare Other | Admitting: Certified Registered"

## 2021-06-24 ENCOUNTER — Encounter
Admission: EM | Disposition: A | Discharge status: Home / Self Care | Payer: Self-pay | Source: Home / Self Care | Attending: Internal Medicine

## 2021-06-24 ENCOUNTER — Encounter: Payer: Self-pay | Admitting: Internal Medicine

## 2021-06-24 DIAGNOSIS — D62 Acute posthemorrhagic anemia: Secondary | ICD-10-CM | POA: Diagnosis not present

## 2021-06-24 DIAGNOSIS — F1020 Alcohol dependence, uncomplicated: Secondary | ICD-10-CM | POA: Diagnosis not present

## 2021-06-24 DIAGNOSIS — I10 Essential (primary) hypertension: Secondary | ICD-10-CM | POA: Diagnosis not present

## 2021-06-24 DIAGNOSIS — K51511 Left sided colitis with rectal bleeding: Secondary | ICD-10-CM | POA: Diagnosis not present

## 2021-06-24 HISTORY — PX: FLEXIBLE SIGMOIDOSCOPY: SHX5431

## 2021-06-24 LAB — BASIC METABOLIC PANEL
Anion gap: 8 (ref 5–15)
BUN: 10 mg/dL (ref 8–23)
CO2: 24 mmol/L (ref 22–32)
Calcium: 8.8 mg/dL — ABNORMAL LOW (ref 8.9–10.3)
Chloride: 106 mmol/L (ref 98–111)
Creatinine, Ser: 1.1 mg/dL (ref 0.61–1.24)
GFR, Estimated: >60 mL/min (ref >60)
Glucose, Bld: 97 mg/dL (ref 70–99)
Potassium: 3.7 mmol/L (ref 3.5–5.1)
Sodium: 138 mmol/L (ref 135–145)

## 2021-06-24 LAB — CBC
HCT: 33 % — ABNORMAL LOW (ref 39.0–52.0)
Hemoglobin: 10.3 g/dL — ABNORMAL LOW (ref 13.0–17.0)
MCH: 29 pg (ref 26.0–34.0)
MCHC: 31.2 g/dL (ref 30.0–36.0)
MCV: 93 fL (ref 80.0–100.0)
Platelets: 178 10*3/uL (ref 150–400)
RBC: 3.55 MIL/uL — ABNORMAL LOW (ref 4.22–5.81)
RDW: 13.1 % (ref 11.5–15.5)
WBC: 12.9 10*3/uL — ABNORMAL HIGH (ref 4.0–10.5)
nRBC: 0 % (ref 0.0–0.2)

## 2021-06-24 SURGERY — SIGMOIDOSCOPY, FLEXIBLE
Anesthesia: General

## 2021-06-24 MED ORDER — FLEET ENEMA 7-19 GM/118ML RE ENEM
1.0000 | ENEMA | Freq: Once | RECTAL | Status: AC
  Administered 2021-06-24: 08:00:00 1 via RECTAL

## 2021-06-24 MED ORDER — SODIUM CHLORIDE 0.9 % IV SOLN: INTRAVENOUS | Status: DC

## 2021-06-24 MED ORDER — PROPOFOL 500 MG/50ML IV EMUL
INTRAVENOUS | Status: AC
  Filled 2021-06-24: qty 50

## 2021-06-24 MED ORDER — METOPROLOL TARTRATE 5 MG/5ML IV SOLN
5.0000 mg | Freq: Once | INTRAVENOUS | Status: AC
  Administered 2021-06-24: 21:00:00 5 mg via INTRAVENOUS
  Filled 2021-06-24: qty 5

## 2021-06-24 NOTE — H&P (Signed)
Cameron Bellows, MD [address] [place], [place], [place], [address] [address] [place], [place], [place], [address] Phone: [phone]  Fax: [phone]  Primary Care Physician:  [name], MD   Pre-Procedure History & Physical: HPI:  Cameron Dilone. is a 81 y.o. male is here for a sigmoidoscopy   Past Medical History:  Diagnosis Date   Alcoholism [facility])    Arthritis    Atrial fibrillation ([place]) 1995   one episode   Basal cell carcinoma 04/26/2017   Right medial cheek. Superficial and nodular   Basal cell carcinoma 06/11/2019   Left anterior shoulder. Nodular pattern   Basal cell carcinoma 09/09/2019   Right nasal ala, EDC   Basal cell carcinoma 02/05/2020   L upper eyebrow, EDC    Carotid arterial disease ([place])    a. 07/04/19 Carotid U/S: <50% bilat ICA stenoses.   Chicken pox    Colon cancer (HCC)    Colon polyp    Depression    Son died 03-Jul-2014   Diverticulitis    Diverticulosis 30 years   Diverticulosis    Dysrhythmia    Gastritis    GERD (gastroesophageal reflux disease)    H. pylori infection    History of cerebral hemorrhage    History of echocardiogram    a. July 04, 2019 Echo: EF 55-60%, no rwma, triv MR/AI.   History of stress test    a. 09/2015 MV: No ischemia/infarct. EF 45-54% (nl by echo).   Hyperlipidemia    Hypertension    Irritable bowel syndrome    Prostate cancer ([place]) 07-03-2010   treated with radiation therapy. Prostate   PSVT (paroxysmal supraventricular tachycardia) ([place])    a. 06/2019 Zio: Avg rate 74 (54-120), occas PACs, rare PVCs, 125 episodes of PVCs (longest 17.5 secs; max rate 187). No afib.   Squamous cell carcinoma of skin 07/18/2017   Left medial calf. KA type   Squamous cell carcinoma of skin 04/24/2018   Right above med. brow   Squamous cell carcinoma of skin 06/11/2019   Right posterior shoulder. SCCis, hypertrophic   Squamous cell carcinoma of skin 01/09/2020   Mid nasal dorsum, MOHS, Efudex x 4wks   Stroke [facility])     Vasovagal syncope     Past Surgical History:  Procedure Laterality Date   [place] no stents   CATARACT EXTRACTION, BILATERAL     COLON RESECTION SIGMOID N/A 12/07/2016   Procedure: COLON RESECTION SIGMOID;  Surgeon: Clayburn Pert, MD;  Location: [facility] ORS;  Service: General;  Laterality: N/A;   COLON SURGERY  11/2016   Colostomy   COLONOSCOPY  Jul 03, 2014   COLONOSCOPY WITH PROPOFOL N/A 05/30/2016   Procedure: COLONOSCOPY WITH PROPOFOL;  Surgeon: Lollie Sails, MD;  Location: [facility] ENDOSCOPY;  Service: Endoscopy;  Laterality: N/A;   COLOSTOMY Left 12/07/2016   Procedure: COLOSTOMY;  Surgeon: Clayburn Pert, MD;  Location: [facility] ORS;  Service: General;  Laterality: Left;   COLOSTOMY REVERSAL N/A 03/21/2017   Procedure: COLOSTOMY REVERSAL;  Surgeon: Clayburn Pert, MD;  Location: [facility] ORS;  Service: General;  Laterality: N/A;   COLOSTOMY TAKEDOWN N/A 03/21/2017   Procedure: LAPAROSCOPIC COLOSTOMY TAKEDOWN;  Surgeon: Clayburn Pert, MD;  Location: [facility] ORS;  Service: General;  Laterality: N/A;   CYSTOSCOPY WITH STENT PLACEMENT Bilateral 03/21/2017   Procedure: CYSTOSCOPY WITH LIGHTED STENT PLACEMENT;  Surgeon: Abbie Sons, MD;  Location: [facility] ORS;  Service: Urology;  Laterality: Bilateral;   ESOPHAGOGASTRODUODENOSCOPY  ESOPHAGOGASTRODUODENOSCOPY N/A 05/30/2016   Procedure: ESOPHAGOGASTRODUODENOSCOPY (EGD);  Surgeon: Lollie Sails, MD;  Location[facility] ENDOSCOPY;  Service: Endoscopy;  Laterality: N/A;   ESOPHAGOGASTRODUODENOSCOPY N/A 01/14/2021   Procedure: ESOPHAGOGASTRODUODENOSCOPY (EGD);  Surgeon: Toledo, Benay Pike, MD;  Location: [facility] ENDOSCOPY;  Service: Gastroenterology;  Laterality: N/A;   ESOPHAGOGASTRODUODENOSCOPY (EGD) WITH PROPOFOL N/A 04/28/2021   Procedure: ESOPHAGOGASTRODUODENOSCOPY (EGD) WITH PROPOFOL;  Surgeon: Toledo, Benay Pike, MD;  Location: [facility] ENDOSCOPY;  Service: Gastroenterology;  Laterality: N/A;   EYE SURGERY      cataracts   FLEXIBLE SIGMOIDOSCOPY N/A 03/21/2017   Procedure: FLEXIBLE SIGMOIDOSCOPY;  Surgeon: Clayburn Pert, MD;  Location: [facility] ORS;  Service: General;  Laterality: N/A;   FRACTURE SURGERY Bilateral    right arm and left wrist   INCISION AND DRAINAGE ABSCESS N/A 12/07/2016   Procedure: DRAINAGE  OF INTRA ABDOMINAL ABSCESS;  Surgeon: Clayburn Pert, MD;  Location: [facility]S;  Service: General;  Laterality: N/A;   LAPAROTOMY N/A 12/07/2016   Procedure: EXPLORATORY LAPAROTOMY;  Surgeon: Clayburn Pert, MD;  Location: [facility] OR[facility];  Service: General;  Laterality: N/A;   PROSTATE SURGERY     Microwave therapy   TONSILLECTOMY     TOTAL KNEE ARTHROPLASTY Right 07/27/2016   Procedure: TOTAL KNEE ARTHROPLASTY;  Surgeon: [name], MD;  Location: [facility] ORS;  Service: Orthopedics;  Laterality: Right;  Dr. Erlene Quan had to place Urinary catheter due to prostate cancer history.  Using flexible scope.    Prior to Admission medications   Medication Sig Start Date End Date Taking? Authorizing Provider  amLODipine (NORVASC) 5 MG tablet TAKE 1 TABLET BY MOUTH EVERY DAY 06/21/21  Yes Bacigalupo, Dionne Bucy, MD  aspirin 81 MG EC tablet Take 81 mg by mouth daily.    Yes [provider]  atorvastatin (LIPITOR) 40 MG tablet TAKE 1 TABLET BY MOUTH EVERY DAY 09/02/20  Yes Bacigalupo, Dionne Bucy, MD  furosemide (LASIX) 20 MG tablet TAKE 1 TABLET BY MOUTH EVERY DAY 04/13/21  Yes End, Harrell Gave, MD  lisinopril (ZESTRIL) 40 MG tablet TAKE 1 TABLET BY MOUTH EVERY DAY 06/21/21  Yes Bacigalupo, Dionne Bucy, MD  meclizine (ANTIVERT) 25 MG tablet Take 25 mg by mouth daily. 04/14/21  Yes [provider]  metoprolol succinate (TOPROL-XL) 50 MG 24 hr tablet TAKE 1 TABLET BY MOUTH DAILY. TAKE WITH OR IMMEDIATELY FOLLOWING A MEAL. 03/04/21  Yes Bacigalupo, Dionne Bucy, MD  Multiple Vitamin (MULTIVITAMIN WITH MINERALS) TABS tablet Take 1 tablet by mouth daily. Patient taking differently: Take 1 tablet by mouth  daily. Mens daily 50+ 10/24/18  Yes Vaughan Basta, MD  pantoprazole (PROTONIX) 40 MG tablet Take 40 mg by mouth daily.   Yes [provider]  sertraline (ZOLOFT) 50 MG tablet Take 1 tablet (50 mg total) by mouth daily. 06/08/21  Yes Mecum, Erin E, PA-C  vitamin B-12 (CYANOCOBALAMIN) 1000 MCG tablet Take 1,000 mcg by mouth daily.   Yes [provider]  docusate sodium (COLACE) 100 MG capsule Take 100 mg by mouth daily as needed for mild constipation.    [provider]  fluticasone (FLONASE) 50 MCG/ACT nasal spray Place 1 spray into both nostrils daily.    [provider]  Guaifenesin (MUCINEX MAXIMUM STRENGTH) 1200 MG TB12 Take 1 tablet by mouth daily as needed.    [provider]  hydrocortisone 2.5 % cream APPLY TO AFFECTED AREA TWICE A DAY AS NEEDED FOR RASH 10/22/20   Bacigalupo, Dionne Bucy, MD  traMADol (ULTRAM) 50 MG tablet Take 50 mg  by mouth every 6 (six) hours as needed for severe pain. Patient not taking: Reported on 06/22/2021    [provider]    Allergies as of 06/21/2021 - Review Complete 06/21/2021  Allergen Reaction Noted   Formaldehyde Rash 01/20/2015    Family History  Problem Relation Age of Onset   Lung cancer Father        smoker   Other Mother    Sudden death Son        due to "Blood clots"   Bipolar disorder Son    Heart disease Son    Kidney disease Daughter        congenital one small kidney   Prostate cancer Neg Hx    Bladder Cancer Neg Hx     Social History   Socioeconomic History   Marital status: Married    Spouse name: Cameron Foster   Number of children: 2   Years of education: Not on file   Highest education level: Bachelor's degree (e.g., BA, AB, BS)  Occupational History   Occupation: retired Dance movement psychotherapist  Tobacco Use   Smoking status: Former    Packs/day: 1.00    Years: 27.00    Pack years: 27.00    Types: Cigarettes    Quit date: 04/18/1988    Years since quitting: 33.2   Smokeless  tobacco: Current    Types: Chew  Vaping Use   Vaping Use: Never used  Substance and Sexual Activity   Alcohol use: Yes    Alcohol/week: 7.0 standard drinks    Types: 7 Glasses of wine per week    Comment: weekly-Former heavy use ETOH   Drug use: No   Sexual activity: Not Currently    Birth control/protection: None    Comment: Married  Other Topics Concern   Not on file  Social History Narrative   Not on file   Social Determinants of Health   Financial Resource Strain: Low Risk    Difficulty of Paying Living Expenses: Not hard at all  Food Insecurity: No Food Insecurity   Worried About Charity fundraiser in the Last Year: Never true   Arboriculturist in the Last Year: Never true  Transportation Needs: No Transportation Needs   Lack of Transportation (Medical): No   Lack of Transportation (Non-Medical): No  Physical Activity: Inactive   Days of Exercise per Week: 0 days   Minutes of Exercise per Session: 0 min  Stress: No Stress Concern Present   Feeling of Stress : Only a little  Social Connections: Moderately Integrated   Frequency of Communication with Friends and Family: More than three times a week   Frequency of Social Gatherings with Friends and Family: Once a week   Attends Religious Services: More than 4 times per year   Active Member of [employer] or Organizations: No   Attends Music therapist: Never   Marital Status: Married  Human resources officer Violence: Not At Risk   Fear of Current or Ex-Partner: No   Emotionally Abused: No   Physically Abused: No   Sexually Abused: No    Review of Systems: See HPI, otherwise negative ROS  Physical Exam: BP 134/77    Pulse 72    Temp 97.6 F (36.4 C) (Temporal)    Resp 17    Ht 5' 8"  (1.727 m)    Wt 90.7 kg    SpO2 100%    BMI 30.41 kg/m  General:   Alert,  pleasant and cooperative  in NAD Head:  Normocephalic and atraumatic. Neck:  Supple; no masses or thyromegaly. Lungs:  Clear throughout to auscultation,  normal respiratory effort.    Heart:  +S1, +S2, Regular rate and rhythm, No edema. Abdomen:  Soft, nontender and nondistended. Normal bowel sounds, without guarding, and without rebound.   Neurologic:  Alert and  oriented x4;  grossly normal neurologically.  Impression/Plan: Cameron Perna. is here for a flexible sigmoidoscopy to be performed for rectal bleeding. Risks, benefits, limitations, and alternatives regarding  colonoscopy have been reviewed with the patient.  Questions have been answered.  All parties agreeable.   Cameron Bellows, MD  06/24/2021, 9:52 AM

## 2021-06-24 NOTE — TOC Initial Note (Addendum)
Transition of Care (TOC) - Initial/Assessment Note  ? ? ?Patient Details  ?Name: Cameron Foster. ?MRN: [id number] ?Date of Birth: 11/14/1940 ? ?Transition of Care (TOC) CM/SW Contact:    ?Beverly Sessions, RN ?Phone Number: ?06/24/2021, 4:06 PM ? ?Clinical Narrative:                 ? ?Admitted for: Colitis  ?Admitted from: home with wife ?[id number] ?[facility] ?Current home health/prior home health/DME:cane ? ?Agreeable to home health. States he does not have preference of home health agency.  Referral made [name] with [facility] ? ?Referral to [facility] with [facility] for RW.  To be delivered to room ? ?Decline resources for alcohol use  ? ? ?Expected Discharge Plan: [place] ?Barriers to Discharge: Continued Medical Work up ? ? ?Patient Goals and CMS Choice ?  ?  ?  ? ?Expected Discharge Plan and Services ?Expected Discharge Plan: [place] ?  ?  ?  ?Living arrangements for the past 2 months: [place] ?                ?DME Arranged: Walker rolling ?DME Agency: [facility] ?Date DME Agency Contacted: 06/24/21 ?Time DME Agency Contacted: [phone] ?Representative spoke with at DME Agency: Andee Poles ?HH Arranged: PT ?[place] Agency: [place] [facility] ?Date HH Agency Contacted: 06/24/21 ?Time [place]: 5027 ?Representative spoke with at [place]: [name] ? ?Prior Living Arrangements/Services ?Living arrangements for the past 2 months: [place] ?Lives with:: Spouse ?Patient language and need for interpreter reviewed:: Yes ?Do you feel safe going back to the place where you live?: Yes      ?Need for Family Participation in Patient Care: Yes (Comment) ?Care giver support system in place?: Yes (comment) ?Current home services: DME ?Criminal Activity/Legal Involvement Pertinent to Current Situation/Hospitalization: No - Comment as needed ? ?Activities of Daily Living ?Home Assistive Devices/Equipment: Cane (specify quad or straight), Walker  (specify type), Eyeglasses, Dentures (specify type) ?ADL Screening (condition at time of admission) ?Patient's cognitive ability adequate to safely complete daily activities?: Yes ?Is the patient deaf or have difficulty hearing?: No ?Does the patient have difficulty seeing, even when wearing glasses/contacts?: No ?Does the patient have difficulty concentrating, remembering, or making decisions?: No ?Patient able to express need for assistance with ADLs?: Yes ?Does the patient have difficulty dressing or bathing?: No ?Independently performs ADLs?: No ?Communication: Independent ?Dressing (OT): Independent ?Grooming: Needs assistance ?Is this a change from baseline?: Pre-admission baseline ?Feeding: Needs assistance ?Is this a change from baseline?: Pre-admission baseline ?Bathing: Needs assistance ?Is this a change from baseline?: Change from baseline, expected to last <3 days ?Toileting: Needs assistance ?Is this a change from baseline?: Change from baseline, expected to last <3 days ?In/Out Bed: Needs assistance ?Is this a change from baseline?: Pre-admission baseline ?Walks in Home: Needs assistance ?Is this a change from baseline?: Pre-admission baseline ?Does the patient have difficulty walking or climbing stairs?: Yes ?Weakness of Legs: Both ?Weakness of Arms/Hands: Both ? ?Permission Sought/Granted ?  ?  ?   ?   ?   ?   ? ?Emotional Assessment ?  ?  ?  ?Orientation: : Oriented to Self, Oriented to Place, Oriented to Situation, Oriented to  Time ?Alcohol / Substance Use: Alcohol Use ?  ? ?Admission diagnosis:  Hematochezia [K92.1] ?Colitis [K52.9] ?Rectal bleeding [K62.5] ?Patient Active Problem List  ? Diagnosis Date Noted  ? Left sided colitis ([place]) 06/23/2021  ?  Hematochezia 06/22/2021  ? Actinic keratosis 05/24/2021  ? Squamous cell cancer of skin of left forearm 05/24/2021  ? Chronic gastric ulcer with hemorrhage 02/25/2021  ? Irregular Z line of esophagus 02/25/2021  ? Chronic pansinusitis 01/26/2021  ?  Dizziness 01/26/2021  ? Chronic cough 01/26/2021  ? Need for influenza vaccination 01/26/2021  ? Hospital discharge follow-up 01/26/2021  ? Melena 01/13/2021  ? Acute blood loss anemia 01/13/2021  ? Alcohol use disorder, moderate, dependence ([place]) 01/13/2021  ? Stage 3a chronic kidney disease ([place]) 11/23/2020  ? Anemia 11/23/2020  ? Chronic pain of right ankle 05/21/2020  ? Encounter for attention to colostomy ([place]) 05/21/2020  ? Achilles tendinitis 04/30/2020  ? Synovitis and tenosynovitis 04/30/2020  ? Lymphedema 03/03/2020  ? Chronic venous insufficiency 03/03/2020  ? Fatigue 02/14/2020  ? Leg edema 02/14/2020  ? Foraminal stenosis of lumbar region 11/08/2019  ? Chronic bilateral low back pain without sciatica 10/18/2019  ? Foraminal stenosis of cervical region 10/18/2019  ? PSVT (paroxysmal supraventricular tachycardia) ([place]) 08/01/2019  ? Lacunar infarction ([place]) 07/10/2019  ? History of lacunar cerebrovascular accident (CVA) 07/01/2019  ? Cerebrovascular accident (CVA) ([place]) 06/08/2019  ? Paroxysmal atrial fibrillation ([place]) 11/07/2018  ? TIA (transient ischemic attack) 10/20/2018  ? History of subarachnoid hemorrhage 10/16/2018  ? Acute recurrent sinusitis 10/16/2018  ? Low libido 08/30/2018  ? Obesity 05/14/2018  ? Hyperlipidemia LDL goal <70 05/14/2018  ? Degeneration of lumbar intervertebral disc 03/22/2018  ? Osteoarthritis of hip 03/22/2018  ? Trochanteric bursitis of right hip 03/22/2018  ? History of colostomy reversal 03/21/2017  ? Status post partial colectomy 01/03/2017  ? Depression, major, single episode, mild ([place]) 01/03/2017  ? Vasovagal syncope 01/27/2016  ? History of skin cancer 10/28/2015  ? Osteoarthritis of right knee 10/01/2015  ? Essential hypertension 08/25/2015  ? Dyspnea on exertion 08/12/2015  ? Erectile dysfunction following radiation therapy 08/04/2015  ? Constipation 08/02/2015  ? History of prostate cancer 01/20/2015  ? Swelling of right lower extremity 01/20/2015  ? ?PCP:   [name], MD ?[facility]   [id number] [id number]?Phone: [phone]: [phone]? ? ? ? ?Social Determinants of Health (SDOH) Interventions ?  ? ?Readmission Risk Interventions ?No flowsheet data found. ? ? ?

## 2021-06-24 NOTE — Progress Notes (Signed)
Occupational Therapy Treatment ?Patient Details ?Name: Cameron Foster. ?MRN: [id number] ?DOB: 1940/12/12 ?Today's Date: 06/24/2021 ? ? ?History of present illness Ayron Fillinger. is a 81 y.o. male with medical history of chronic alcohol use, paroxysmal A-fib not on anticoagulation, CVA, hypertension, history of upper GI bleed secondary to pyloric ulcer who presents with bright red blood per rectum. ?  ?OT comments ? Upon entering the room, pt supine in bed and agreeable to OT intervention. Pt performed supine >sit without physical assistance and min cuing for technique and hand placement. Pt requesting to brush teeth. Pt stands with min guard and ambulates with RW to sink. Pt stands for ~ 8 minutes to brush teeth and wash face with close supervision - min guard. Pt then ambulates 10' to bathroom with RW for toileting needs. Pt has successful BM and shows good safety awareness by performing hygiene while seated. Pt standing with min guard and performing hand hygiene at sink before returning to bed in same manner as above. All needs within reach and pt making great progress towards goals this session. OT recommendation changed to home with HHOT to address functional deficits at discharge. All needs within reach and bed alarm activated as OT exits the room.   ? ?Recommendations for follow up therapy are one component of a multi-disciplinary discharge planning process, led by the attending physician.  Recommendations may be updated based on patient status, additional functional criteria and insurance authorization. ?   ?Follow Up Recommendations ? Home health OT  ?  ?   ?Patient can return home with the following ? Assistance with cooking/housework;Help with stairs or ramp for entrance;Assist for transportation;A little help with walking and/or transfers;A little help with bathing/dressing/bathroom ?  ?Equipment Recommendations ? None recommended by OT  ?  ?Recommendations for Other Services   ? ?  ?Precautions /  Restrictions Precautions ?Precautions: Fall ?Restrictions ?Weight Bearing Restrictions: No  ? ? ?  ? ?Mobility Bed Mobility ?Overal bed mobility: Needs Assistance ?Bed Mobility: Supine to Sit ?  ?  ?Supine to sit: Supervision ?Sit to supine: Supervision ?  ?General bed mobility comments: increased time and min cuing for technique and hand placement ?  ? ?Transfers ?Overall transfer level: Needs assistance ?Equipment used: Rolling walker (2 wheels) ?Transfers: Sit to/from Stand ?Sit to Stand: Min guard ?  ?  ?  ?  ?  ?  ?  ?  ?Balance Overall balance assessment: Needs assistance ?Sitting-balance support: Bilateral upper extremity supported, Feet supported ?Sitting balance-Leahy Scale: Fair ?  ?  ?Standing balance support: No upper extremity supported, During functional activity ?Standing balance-Leahy Scale: Fair ?  ?  ?  ?  ?  ?  ?  ?  ?  ?  ?  ?  ?   ? ?ADL either performed or assessed with clinical judgement  ? ?ADL Overall ADL's : Needs assistance/impaired ?  ?  ?Grooming: Wash/dry hands;Wash/dry face;Min guard;Standing;Oral care ?  ?  ?  ?  ?  ?  ?  ?  ?  ?Toilet Transfer: Min guard;Rolling walker (2 wheels);Regular Toilet ?  ?Toileting- Water quality scientist and Hygiene: Min guard;Sit to/from stand ?  ?  ?  ?Functional mobility during ADLs: Min guard;Rolling walker (2 wheels) ?  ?  ? ?Extremity/Trunk Assessment Upper Extremity Assessment ?Upper Extremity Assessment: Generalized weakness ?  ?Lower Extremity Assessment ?Lower Extremity Assessment: Generalized weakness ?  ?  ?  ? ?Vision Patient Visual Report: No change from baseline ?  ?  ?   ?   ? ?  Cognition Arousal/Alertness: Awake/alert ?Behavior During Therapy: [facility] for tasks assessed/performed ?Overall Cognitive Status: Within Functional Limits for tasks assessed ?  ?  ?  ?  ?  ?  ?  ?  ?  ?  ?  ?  ?  ?  ?  ?  ?  ?  ?  ?   ?   ?   ?General Comments on RA with mobility: 92%  ? ? ?Pertinent Vitals/ Pain       Pain Assessment ?Pain Assessment: No/denies  pain ? ?Home Living Family/patient expects to be discharged to:: Private residence ?Living Arrangements: Spouse/significant other ?Available Help at Discharge: Family;Available 24 hours/day ?Type of Home: House ?  ?  ?  ?  ?  ?  ?  ?  ?  ?  ?  ?  ?  ?  ?  ? ?  ?   ? ?Frequency ? Min 2X/week  ? ? ? ? ?  ?Progress Toward Goals ? ?OT Goals(current goals can now be found in the care plan section) ? Progress towards OT goals: Progressing toward goals ? ?Acute Rehab OT Goals ?Patient Stated Goal: to get stronger and go home ?OT Goal Formulation: With patient ?Time For Goal Achievement: 07/07/21 ?Potential to Achieve Goals: Good  ?Plan Discharge plan needs to be updated;Frequency remains appropriate   ? ?   ?AM-PAC OT "6 Clicks" Daily Activity     ?Outcome Measure ? ? Help from another person eating meals?: None ?Help from another person taking care of personal grooming?: None ?Help from another person toileting, which includes using toliet, bedpan, or urinal?: A Little ?Help from another person bathing (including washing, rinsing, drying)?: A Little ?Help from another person to put on and taking off regular upper body clothing?: None ?Help from another person to put on and taking off regular lower body clothing?: A Little ?6 Click Score: 21 ? ?  ?End of Session Equipment Utilized During Treatment: Rolling walker (2 wheels) ? ?OT Visit Diagnosis: Unsteadiness on feet (R26.81);Muscle weakness (generalized) (M62.81) ?  ?Activity Tolerance Patient tolerated treatment well ?  ?Patient Left with call bell/phone within reach;with bed alarm set;in bed ?  ?Nurse Communication Mobility status ?  ? ?   ? ?Time: 9432-7614 ?OT Time Calculation (min): 24 min ? ?Charges: OT General Charges ?$OT Visit: 1 Visit ?OT Treatments ?$Self Care/Home Management : 23-37 mins ? ?Darleen Crocker, MS, OTR/L , CBIS ?ascom [phone]  ?06/24/21, 3:37 PM  ?

## 2021-06-24 NOTE — Progress Notes (Signed)
PT Oxygen sats ? ?Patient Details ?Name: Cameron Foster. ?MRN: [id number] ?DOB: [birth year] ? ? ?SaO2 on room air at rest = 96% ?SaO2 on room air while ambulating = 92% ? ?Salem Caster. Fairly IV, PT, DPT ?Physical Therapist- [place]  ?[facility]  ?06/24/2021, 12:39 PM ?

## 2021-06-24 NOTE — Progress Notes (Signed)
Physical Therapy Treatment ?Patient Details ?Name: Cameron Foster. ?MRN: [id number] ?DOB: 09-Jun-1940 ?Today's Date: 06/24/2021 ? ? ?History of Present Illness Cameron Foster. is a 81 y.o. male with medical history of chronic alcohol use, paroxysmal A-fib not on anticoagulation, CVA, hypertension, history of upper GI bleed secondary to pyloric ulcer who presents with bright red blood per rectum. ? ?  ?PT Comments  ? ? Pt received supine in bed agreeable to PT services. Resting on 3L/min with SPO2 at 98%. Titrated to RA and at rest pt remained at 96%. Pt requiring minA for reaching EOB but is supervision for STS and ambulation. Pt is more steady today in standing with equal weightbearing through B feet compared to post lean previous session. Pt able to perform step through gait in hallway with use of RW without unsteadiness with safe use of RW and correct sequencing with turns and transfers. Pt returned to recliner with Spo2 at 92% on RA post ambulation. All needs in reach. Pt educated on updating D/c recs to [facility] PT due to improvements in safe mobility and gait with AD.  ?    ?Recommendations for follow up therapy are one component of a multi-disciplinary discharge planning process, led by the attending physician.  Recommendations may be updated based on patient status, additional functional criteria and insurance authorization. ? ?Follow Up Recommendations ? Home health PT ?  ?  ?Assistance Recommended at Discharge Intermittent Supervision/Assistance  ?Patient can return home with the following A little help with walking and/or transfers;A little help with bathing/dressing/bathroom;Assistance with cooking/housework;Assist for transportation ?  ?Equipment Recommendations ? Rolling walker (2 wheels)  ?  ?Recommendations for Other Services   ? ? ?  ?Precautions / Restrictions Precautions ?Precautions: Fall ?Restrictions ?Weight Bearing Restrictions: No  ?  ? ?Mobility ? Bed Mobility ?Overal bed mobility: Needs  Assistance ?Bed Mobility: Supine to Sit ?  ?  ?Supine to sit: Min assist, HOB elevated ?  ?  ?General bed mobility comments: MinA at torso ?Patient Response: Cooperative ? ?Transfers ?Overall transfer level: Needs assistance ?Equipment used: Rolling walker (2 wheels) ?Transfers: Sit to/from Stand ?Sit to Stand: Min guard ?  ?  ?  ?  ?  ?  ?  ? ?Ambulation/Gait ?Ambulation/Gait assistance: Min guard ?Gait Distance (Feet): 60 Feet ?Assistive device: Rolling walker (2 wheels) ?Gait Pattern/deviations: Step-through pattern, Decreased step length - left, Decreased step length - right ?  ?  ?  ?General Gait Details: Significantly improved gait and stability with RW compared to without. ? ? ?Stairs ?  ?  ?  ?  ?  ? ? ?Wheelchair Mobility ?  ? ?Modified Rankin (Stroke Patients Only) ?  ? ? ?  ?Balance Overall balance assessment: Needs assistance ?Sitting-balance support: Bilateral upper extremity supported, Feet supported ?Sitting balance-Leahy Scale: Fair ?  ?  ?Standing balance support: No upper extremity supported, During functional activity ?Standing balance-Leahy Scale: Fair ?  ?  ?  ?  ?  ?  ?  ?  ?  ?  ?  ?  ?  ? ?  ?Cognition Arousal/Alertness: Awake/alert ?Behavior During Therapy: [facility] for tasks assessed/performed ?Overall Cognitive Status: Within Functional Limits for tasks assessed ?  ?  ?  ?  ?  ?  ?  ?  ?  ?  ?  ?  ?  ?  ?  ?  ?  ?  ?  ? ?  ?Exercises Other Exercises ?Other Exercises: updating d/c recs ? ?  ?  General Comments General comments (skin integrity, edema, etc.): on RA with mobility: 92% ?  ?  ? ?Pertinent Vitals/Pain Pain Assessment ?Pain Assessment: No/denies pain  ? ? ?Home Living   ?  ?  ?  ?  ?  ?  ?  ?  ?  ?   ?  ?Prior Function    ?  ?  ?   ? ?PT Goals (current goals can now be found in the care plan section) Acute Rehab PT Goals ?Patient Stated Goal: to get stronger before going home ?PT Goal Formulation: With patient ?Time For Goal Achievement: 07/07/21 ?Potential to Achieve Goals:  Fair ?Progress towards PT goals: Progressing toward goals ? ?  ?Frequency ? ? ? Min 2X/week ? ? ? ?  ?PT Plan Discharge plan needs to be updated  ? ? ?Co-evaluation   ?  ?  ?  ?  ? ?  ?AM-PAC PT "6 Clicks" Mobility   ?Outcome Measure ? Help needed turning from your back to your side while in a flat bed without using bedrails?: A Little ?Help needed moving from lying on your back to sitting on the side of a flat bed without using bedrails?: A Little ?Help needed moving to and from a bed to a chair (including a wheelchair)?: A Little ?Help needed standing up from a chair using your arms (e.g., wheelchair or bedside chair)?: A Little ?Help needed to walk in hospital room?: A Little ?Help needed climbing 3-5 steps with a railing? : A Lot ?6 Click Score: 17 ? ?  ?End of Session Equipment Utilized During Treatment: Gait belt ?Activity Tolerance: Patient tolerated treatment well ?Patient left: in chair;with call bell/phone within reach;with chair alarm set ?Nurse Communication: Mobility status ?PT Visit Diagnosis: Unsteadiness on feet (R26.81);Difficulty in walking, not elsewhere classified (R26.2);Other abnormalities of gait and mobility (R26.89);Muscle weakness (generalized) (M62.81) ?  ? ? ?Time: 6295-2841 ?PT Time Calculation (min) (ACUTE ONLY): 23 min ? ?Charges:  $Gait Training: 23-37 mins          ?          ? ?Salem Caster. Fairly IV, PT, DPT ?Physical Therapist- [place]  ?[facility]  ?06/24/2021, 12:33 PM ? ?

## 2021-06-24 NOTE — Op Note (Signed)
[facility] ?Gastroenterology ?Patient Name: Cameron Foster ?Procedure Date: 06/24/2021 9:53 AM ?MRN: [id number] ?Account #: [id number] ?Date of Birth: [birth year] ?Admit Type: Outpatient ?Age: 81 ?Room: [facility] ENDO ROOM 3 ?Gender: Male ?Note Status: Finalized ?Instrument Name: Peds Colonoscope [id number] ?Procedure:             Flexible Sigmoidoscopy ?Indications:           Hematochezia ?Providers:             Jonathon Bellows MD, MD ?Referring MD:          Dionne Bucy. Bacigalupo (Referring MD) ?Medicines:             None ?Complications:         No immediate complications. ?Procedure:             Pre-Anesthesia Assessment: ?                       - Prior to the procedure, a History and Physical was  ?                       performed, and patient medications, allergies and  ?                       sensitivities were reviewed. The patient's tolerance  ?                       of previous anesthesia was reviewed. ?                       - The risks and benefits of the procedure and the  ?                       sedation options and risks were discussed with the  ?                       patient. All questions were answered and informed  ?                       consent was obtained. ?                       - ASA Grade Assessment: III - A patient with severe  ?                       systemic disease. ?                       After obtaining informed consent, the scope was passed  ?                       under direct vision. The Colonoscope was introduced  ?                       through the anus and advanced to the the descending  ?                       colon. The flexible sigmoidoscopy was accomplished  ?                       with ease. The  patient tolerated the procedure well.  ?                       The quality of the bowel preparation was excellent. ?Findings: ?     The perianal and digital rectal examinations were normal. ?     A continuous area of nonbleeding ulcerated mucosa with no stigmata of  ?     recent  bleeding was present in the proximal sigmoid colon and in the  ?     descending colon. Biopsies were taken with a cold forceps for histology.  ?     The mucosa had a lot of slough and hemorrhagic areas.edema . ?     The exam was otherwise without abnormality. ?Impression:            - Mucosal ulceration. Biopsied. ?                       - The examination was otherwise normal. ?Recommendation:        - Return patient to hospital ward for ongoing care. ?                       - Advance diet as tolerated. ?                       - Continue supportive care likely colonic ischemia,  ?                       Serial abdominal exmas, if doing well and no rectal  ?                       bleeding can go home, if worsens cliniocally call  ?                       surgery. Complete course of antibiotics for 5 days .GI  ?                       will sign off. He will need a colonoscopy as an  ?                       outpatient ?Procedure Code(s):     --- Professional --- ?                       [phone], Sigmoidoscopy, flexible; with biopsy, single or  ?                       multiple ?Diagnosis Code(s):     --- Professional --- ?                       K63.3, Ulcer of intestine ?                       K92.1, Melena (includes Hematochezia) ?CPT copyright 2019 American Medical Association. All rights reserved. ?The codes documented in this report are preliminary and upon coder review may  ?be revised to meet current compliance requirements. ?Jonathon Bellows, MD ?Jonathon Bellows MD, MD ?06/24/2021 10:11:00 AM ?This report has been signed electronically. ?Number of Addenda: 0 ?Note Initiated On: 06/24/2021 9:53 AM ?Total Procedure Duration: 0 hours 8 minutes 27 seconds  ?  Estimated Blood Loss:  Estimated blood loss: none. ?     [facility] ?

## 2021-06-24 NOTE — Progress Notes (Signed)
Cross Cover ?Patient with history of atrial fib now in a fib RVR with rates 130s to 150s. Patient did not receive daily metoprolol this morning secondary to procedure. BP stable. Dose of 5 mg IV metoprolol ordered ? ?

## 2021-06-24 NOTE — Progress Notes (Signed)
[facility] Progress Note ? ?Patient: Cameron Foster.    HOZ:[id number]  DOA: 06/22/2021    ?Date of Service: the patient was seen and examined on 06/24/2021 ? ?Brief hospital course: ?81 year old male past medical history of alcoholism, atrial fibrillation previous GI bleed from pyloric ulcer as well as diverticulosis presented to the emergency room on 3/7 with complaints of rectal bleeding/bright red blood per rectum and hemoglobin noted to be 10.4 (baseline of 13.2).  Admitted to the hospitalist service & GI consulted. CT angiogram noted left-sided colitis felt to be infectious versus inflammatory. ? ?Hemoglobin today at 9.4.  Patient's flexible sigmoidoscopy noted colitis, suspicious for ischemia. ? ?Assessment and Plan: ?Assessment and Plan: ?* Left sided colitis ([place]) ?Ischemic versus infectious.  Given persistently elevated [place] blood cell count, started on antibiotics on 3/8.  Hemoglobin remained stable.  Flexible sigmoidoscopy notes possible ischemic colitis.  We will plan to observe for another 24 hours and continue antibiotics. ? ?Hematochezia ?Most likely related to bleeding hemorrhoids ?Will consult GI for further evaluation ?Check serial H&H and transfuse as needed ? ?Acute blood loss anemia ?Felt to be secondary to left-sided colitis.  Infectious favored over inflammatory.  Continue to monitor closely, awaiting cultures.  Hemoglobin remaining stable. ? ?Essential hypertension ?Blood pressure is stable ?Continue lisinopril and metoprolol ? ?Depression, major, single episode, mild ([place]) ?Continue Zoloft. ? ?Alcohol use disorder, moderate, dependence ([place]) ?Patient with a history of alcohol use disorder ?On CIWA protocol and administer lorazepam for CIWA score of 8 or greater ?Patient on MVI, thiamine and folic acid ? ?Obesity ?Meets criteria with BMI greater than 30 ? ?Hyperlipidemia LDL goal <70 ?Continue statin ? ?Constipation ?Related to opioid use for pain control ?We will start patient on  MiraLAX 17 g daily as well as senna 1 tablet daily ? ?Osteoarthritis of hip ?Patient has plans for arthroplasty on Monday, 3/13.  We will plan to keep orthopedic surgery in the loop.  It is possible he still may be able to have surgery as scheduled depending on when he is discharged and his overall stability. ? ? ? ? ?Body mass index is 30.41 kg/m?.  ?  ?   ? ?Consultants: ?Gastroenterology ? ?Procedures: ?flexible sigmoidoscopy 3/9 ? ?Antimicrobials: ?IV cefepime 3/8-present ? ?Code Status: Full code ? ? ?Subjective: Patient feeling better, still some mild abdominal discomfort ? ?Objective: ?Vital signs were reviewed and unremarkable. ?Vitals:  ? 06/24/21 1002 06/24/21 1011  ?BP: (!) 151/79 (!) 145/79  ?Pulse:    ?Resp:  16  ?Temp:    ?SpO2:  97%  ? ? ?Intake/Output Summary (Last 24 hours) at 06/24/2021 1305 ?Last data filed at 06/24/2021 0411 ?Gross per 24 hour  ?Intake 81.11 ml  ?Output 300 ml  ?Net -218.89 ml  ? ?Filed Weights  ? 06/21/21 2033 06/24/21 0901  ?Weight: 90.7 kg 90.7 kg  ? ?Body mass index is 30.41 kg/m?. ? ?Exam: ? ?General: Alert and oriented x3, no acute distress ?HEENT: Normocephalic and atraumatic, mucous membranes slightly dry ?Cardiovascular: Regular rate and rhythm, S1-S2 ?Respiratory: Clear to auscultation bilaterally ?Abdomen: Soft, nontender, nondistended, positive bowel sounds ?Musculoskeletal: No clubbing or cyanosis, 1+ pitting edema bilaterally ?Skin: No skin breaks, tears or lesions ?Psychiatry: Appropriate, no evidence of psychoses ?Neurology: No focal deficits ? ?Data Reviewed: ?Hemoglobin stable at 10.3.  [place] blood cell count down to 12.9 ? ?Disposition:  ?Status is: Inpatient ?Remains inpatient appropriate because: Need for flexible sigmoidoscopy, stabilization of bleeding ?  ? ?Family Communication: Sister and wife  at the bedside ?DVT Prophylaxis: ?SCDs Start: 06/22/21 1025 ? ? ? ?Author: ?Annita Brod ,MD ?06/24/2021 1:05 PM ? ?To reach On-call, see care teams to locate the  attending and reach out via [email or web address]. ?Between 7PM-7AM, please contact night-coverage ?If you still have difficulty reaching the attending provider, please page the [facility] (Director on Call) for [facility] on amion for assistance. ? ?

## 2021-06-24 NOTE — Anesthesia Preprocedure Evaluation (Deleted)
Anesthesia Evaluation  ?Patient identified by MRN, date of birth, ID band ?Patient awake ? ? ? ?Reviewed: ?Allergy & Precautions, NPO status , Patient's Chart, lab work & pertinent test results ? ?History of Anesthesia Complications ?Negative for: history of anesthetic complications ? ?Airway ?Mallampati: IV ? ? ?Neck ROM: Full ? ? ? Dental ? ?(+)  ?  ?Pulmonary ?Patient abstained from smoking., former smoker,  ?  ?Pulmonary exam normal ?breath sounds clear to auscultation ? ? ? ? ? ? Cardiovascular ?hypertension, + Peripheral Vascular Disease (carotid artery disease)  ?Normal cardiovascular exam+ dysrhythmias (isolated episode of a fib greater than 10 years ago)  ?Rhythm:Regular Rate:Normal ? ?ECG 12/09/20: NSR, RBBB, LAFB, no change from prior ?  ?Neuro/Psych ?PSYCHIATRIC DISORDERS Depression Alcohol use disorder, pt reports glass of wine daily ?CVA, No Residual Symptoms   ? GI/Hepatic ?PUD, GERD  ,  ?Endo/Other  ?negative endocrine ROS ? Renal/GU ?Renal disease (stage III CKD)  ? ?Prostate CA ? ?  ?Musculoskeletal ? ? Abdominal ?  ?Peds ? Hematology ? ?(+) Blood dyscrasia, anemia ,   ?Anesthesia Other Findings ?Past Medical History: ?No date: Alcoholism [facility]) ?No date: Arthritis ?1995: Atrial fibrillation ([place]) ?    Comment:  one episode ?04/26/2017: Basal cell carcinoma ?    Comment:  Right medial cheek. Superficial and nodular ?06/11/2019: Basal cell carcinoma ?    Comment:  Left anterior shoulder. Nodular pattern ?09/09/2019: Basal cell carcinoma ?    Comment:  Right nasal ala, EDC ?02/05/2020: Basal cell carcinoma ?    Comment:  L upper eyebrow, EDC  ?No date: Carotid arterial disease ([place]) ?    Comment:  a. 2019-07-12 Carotid U/S: <50% bilat ICA stenoses. ?No date: Chicken pox ?No date: Colon cancer [facility]) ?No date: Colon polyp ?No date: Depression ?    Comment:  Son died 07/11/2014 ?No date: Diverticulitis ?30 years: Diverticulosis ?No date: Diverticulosis ?No date: Dysrhythmia ?No  date: Gastritis ?No date: GERD (gastroesophageal reflux disease) ?No date: H. pylori infection ?No date: History of cerebral hemorrhage ?No date: History of echocardiogram ?    Comment:  a. 12-Jul-2019 Echo: EF 55-60%, no rwma, triv MR/AI. ?No date: History of stress test ?    Comment:  a. 09/2015 MV: No ischemia/infarct. EF 45-54% (nl by  ?             echo). ?No date: Hyperlipidemia ?No date: Hypertension ?No date: Irritable bowel syndrome ?07-11-2010: Prostate cancer ([place]) ?    Comment:  treated with radiation therapy. Prostate ?No date: PSVT (paroxysmal supraventricular tachycardia) ([place]) ?    Comment:  a. 06/2019 Zio: Avg rate 74 (54-120), occas PACs, rare  ?             PVCs, 125 episodes of PVCs (longest 17.5 secs; max rate  ?             187). No afib. ?07/18/2017: Squamous cell carcinoma of skin ?    Comment:  Left medial calf. KA type ?04/24/2018: Squamous cell carcinoma of skin ?    Comment:  Right above med. brow ?06/11/2019: Squamous cell carcinoma of skin ?    Comment:  Right posterior shoulder. SCCis, hypertrophic ?01/09/2020: Squamous cell carcinoma of skin ?    Comment:  Mid nasal dorsum, MOHS, Efudex x 4wks ?No date: Stroke [facility]) ?No date: Vasovagal syncope ? ? ?Reviewed 12/09/20 cardiology note. ? Reproductive/Obstetrics ? ?  ? ? ? ? ? ? ? ? ? ? ? ? ? ?  ?  ? ? ? ? ? ? ? ? ?  Anesthesia Physical ? ?Anesthesia Plan ? ?ASA: 3 ? ?Anesthesia Plan: General  ? ?Post-op Pain Management:   ? ?Induction: Intravenous ? ?PONV Risk Score and Plan: 2 and Propofol infusion, TIVA and Treatment may vary due to age or medical condition ? ?Airway Management Planned: Natural Airway ? ?Additional Equipment:  ? ?Intra-op Plan:  ? ?Post-operative Plan:  ? ?Informed Consent: I have reviewed the patients History and Physical, chart, labs and discussed the procedure including the risks, benefits and alternatives for the proposed anesthesia with the patient or authorized representative who has indicated his/her understanding and  acceptance.  ? ? ? ? ? ?Plan Discussed with: CRNA ? ?Anesthesia Plan Comments: (LMA/GETA backup discussed.  Patient consented for risks of anesthesia including but not limited to:  ?- adverse reactions to medications ?- damage to eyes, teeth, lips or other oral mucosa ?- nerve damage due to positioning  ?- sore throat or hoarseness ?- damage to heart, brain, nerves, lungs, other parts of body or loss of life ? ?Informed patient about role of CRNA in peri- and intra-operative care.  Patient voiced understanding.)  ? ? ? ? ? ? ?Anesthesia Quick Evaluation ? ?

## 2021-06-25 ENCOUNTER — Other Ambulatory Visit: Payer: Medicare Other

## 2021-06-25 DIAGNOSIS — K51511 Left sided colitis with rectal bleeding: Secondary | ICD-10-CM | POA: Diagnosis not present

## 2021-06-25 DIAGNOSIS — I480 Paroxysmal atrial fibrillation: Secondary | ICD-10-CM

## 2021-06-25 DIAGNOSIS — D62 Acute posthemorrhagic anemia: Secondary | ICD-10-CM | POA: Diagnosis not present

## 2021-06-25 DIAGNOSIS — I110 Hypertensive heart disease with heart failure: Secondary | ICD-10-CM

## 2021-06-25 DIAGNOSIS — E669 Obesity, unspecified: Secondary | ICD-10-CM | POA: Diagnosis not present

## 2021-06-25 LAB — BASIC METABOLIC PANEL
Anion gap: 6 (ref 5–15)
BUN: 8 mg/dL (ref 8–23)
CO2: 24 mmol/L (ref 22–32)
Calcium: 8.7 mg/dL — ABNORMAL LOW (ref 8.9–10.3)
Chloride: 107 mmol/L (ref 98–111)
Creatinine, Ser: 1.09 mg/dL (ref 0.61–1.24)
GFR, Estimated: >60 mL/min (ref >60)
Glucose, Bld: 106 mg/dL — ABNORMAL HIGH (ref 70–99)
Potassium: 3.5 mmol/L (ref 3.5–5.1)
Sodium: 137 mmol/L (ref 135–145)

## 2021-06-25 LAB — CBC
HCT: 35.1 % — ABNORMAL LOW (ref 39.0–52.0)
Hemoglobin: 11.1 g/dL — ABNORMAL LOW (ref 13.0–17.0)
MCH: 29.1 pg (ref 26.0–34.0)
MCHC: 31.6 g/dL (ref 30.0–36.0)
MCV: 91.9 fL (ref 80.0–100.0)
Platelets: 214 10*3/uL (ref 150–400)
RBC: 3.82 MIL/uL — ABNORMAL LOW (ref 4.22–5.81)
RDW: 13 % (ref 11.5–15.5)
WBC: 11.3 10*3/uL — ABNORMAL HIGH (ref 4.0–10.5)
nRBC: 0 % (ref 0.0–0.2)

## 2021-06-25 LAB — MAGNESIUM: Magnesium: 1.8 mg/dL (ref 1.7–2.4)

## 2021-06-25 LAB — SURGICAL PATHOLOGY

## 2021-06-25 LAB — BRAIN NATRIURETIC PEPTIDE: B Natriuretic Peptide: 498.6 pg/mL — ABNORMAL HIGH (ref 0.0–100.0)

## 2021-06-25 MED ORDER — MAGNESIUM SULFATE 2 GM/50ML IV SOLN
2.0000 g | Freq: Once | INTRAVENOUS | Status: AC
  Administered 2021-06-25: 2 g via INTRAVENOUS
  Filled 2021-06-25: qty 50

## 2021-06-25 MED ORDER — METOPROLOL TARTRATE 5 MG/5ML IV SOLN
5.0000 mg | Freq: Once | INTRAVENOUS | Status: AC
  Administered 2021-06-25: 5 mg via INTRAVENOUS
  Filled 2021-06-25: qty 5

## 2021-06-25 MED ORDER — METOPROLOL SUCCINATE ER 50 MG PO TB24
75.0000 mg | ORAL_TABLET | Freq: Every day | ORAL | Status: DC
Start: 2021-06-26 — End: 2021-06-26
  Administered 2021-06-26: 75 mg via ORAL
  Filled 2021-06-25: qty 1

## 2021-06-25 MED ORDER — METOPROLOL SUCCINATE ER 25 MG PO TB24
25.0000 mg | ORAL_TABLET | ORAL | Status: AC
  Administered 2021-06-25: 25 mg via ORAL
  Filled 2021-06-25: qty 1

## 2021-06-25 MED ORDER — LORATADINE 10 MG PO TABS
10.0000 mg | ORAL_TABLET | Freq: Every day | ORAL | Status: DC
  Administered 2021-06-25 – 2021-07-03 (×9): 10 mg via ORAL
  Filled 2021-06-25 (×9): qty 1

## 2021-06-25 MED ORDER — DILTIAZEM HCL 25 MG/5ML IV SOLN
20.0000 mg | Freq: Once | INTRAVENOUS | Status: AC
  Administered 2021-06-25: 20 mg via INTRAVENOUS
  Filled 2021-06-25: qty 5

## 2021-06-25 MED ORDER — FUROSEMIDE 10 MG/ML IJ SOLN
20.0000 mg | Freq: Once | INTRAMUSCULAR | Status: AC
  Administered 2021-06-25: 20 mg via INTRAVENOUS
  Filled 2021-06-25: qty 4

## 2021-06-25 NOTE — Progress Notes (Signed)
IV metoprolol has improved HR very little.  Notified NP and will now try IV cardizem once.  Will continue to monitor.  ?Christene Slates  06/25/2021  5:31 AM ? ? ? ?

## 2021-06-25 NOTE — Care Management Important Message (Signed)
Important Message ? ?Patient Details  ?Name: Cameron Foster. ?MRN: [id number] ?Date of Birth: [birth year] ? ? ?Medicare Important Message Given:  Yes ? ? ? ? ?Dannette Barbara ?06/25/2021, 10:46 AM ?

## 2021-06-25 NOTE — Progress Notes (Signed)
Cross Cover ?Atrial fib  with RVR 150 - IV metoprolol 5 mg  ?

## 2021-06-25 NOTE — Progress Notes (Signed)
Pharmacy Antibiotic Note ? ?Cameron Foster. is a 81 y.o. male presenting with rectal bleeding, admitted on 06/22/2021 with  intra-abdominal infection .  PMH includes diverticulosis, previous GIB. Pharmacy has been consulted for piperacillin/tazobactam dosing. ? ?CT abdomen with evidence of transverse and descending colitis, likely infectious ?or inflammatory. WBC elevated. Has been afebrile.  ? ?Plan:  ?Continue piperacillin/tazobactam 3.375 grams every 8 hours (4 hour infusion) ? ?Monitor cultures, renal function, length of therapy. ? ?Height: 5' 8"  (172.7 cm) ?Weight: 90.7 kg (200 lb) ?IBW/kg (Calculated) : 68.4 ? ?Temp (24hrs), Avg:98.5 ?F (36.9 ?C), Min:98.3 ?F (36.8 ?C), Max:98.9 ?F (37.2 ?C) ? ?Recent Labs  ?Lab 06/21/21 ?2035 06/22/21 ?0444 06/22/21 ?0901 06/23/21 ?9528 06/23/21 ?1429 06/24/21 ?0422 06/25/21 ?4132  ?WBC 18.7*  --   --   --  17.0* 12.9* 11.3*  ?CREATININE 1.15  --   --  1.10  --  1.10 1.09  ?LATICACIDVEN  --  1.1 1.2  --   --   --   --   ? ?  ?Estimated Creatinine Clearance: 59.1 mL/min (by C-G formula based on SCr of 1.09 mg/dL).   ? ?Allergies  ?Allergen Reactions  ? Formaldehyde Rash  ?  From shoes made with this material  ? ? ?Antimicrobials this admission: ?Zosyn 3/8 >>  ? ? ?Dose adjustments this admission: ?N/a ? ?Microbiology results: ?3/7 BCx: NG x 1d ?3/7 GI panel: not detected  ? ?Thank you for allowing pharmacy to be a part of this patient?s care. ? ?Noralee Space, PharmD ?06/25/2021 ?10:49 AM ? ? ?

## 2021-06-25 NOTE — TOC Progression Note (Signed)
Transition of Care (TOC) - Progression Note  ? ? ?Patient Details  ?Name: Cameron Foster. ?MRN: [id number] ?Date of Birth: [birth year] ? ?Transition of Care (TOC) CM/SW Contact  ?Beverly Sessions, RN ?Phone Number: ?06/25/2021, 10:15 AM ? ?Clinical Narrative:    ? ?Per MD patient with new afib. Not medically ready for discharge.  ?Will need RW delivered from [employer] prior to discharge.  Per [employer] if delivered prior to 48 hours of discharge insurance will not reimburse for DME ? ?Expected Discharge Plan: [place] ?Barriers to Discharge: Continued Medical Work up ? ?Expected Discharge Plan and Services ?Expected Discharge Plan: [place] ?  ?  ?  ?Living arrangements for the past 2 months: [place] ?                ?DME Arranged: Walker rolling ?DME Agency: [employer] ?Date DME Agency Contacted: 06/24/21 ?Time DME Agency Contacted: [phone] ?Representative spoke with at DME Agency: Andee Poles ?HH Arranged: PT ?[place] Agency: [place] ([place]) ?Date HH Agency Contacted: 06/24/21 ?Time [place]: [phone] ?Representative spoke with at [place]: [name] ? ? ?Social Determinants of Health (SDOH) Interventions ?  ? ?Readmission Risk Interventions ?No flowsheet data found. ? ?

## 2021-06-25 NOTE — Progress Notes (Signed)
[facility] Progress Note ? ?Patient: Cameron Foster.    DPO:[id number]  DOA: 06/22/2021    ?Date of Service: the patient was seen and examined on 06/25/2021 ? ?Brief hospital course: ?81 year old male past medical history of alcoholism, atrial fibrillation previous GI bleed from pyloric ulcer as well as diverticulosis presented to the emergency room on 3/7 with complaints of rectal bleeding/bright red blood per rectum and hemoglobin noted to be 10.4 (baseline of 13.2).  Admitted to the hospitalist service & GI consulted. CT angiogram noted left-sided colitis felt to be infectious versus inflammatory. ? ?Hemoglobin remained stable and actually improved to 11.1 x 3/10.  However, on early morning of 3/10, patient went into new onset rapid atrial fibrillation.  Given 1 dose of Cardizem which controlled heart rate although still in atrial fibrillation, confirmed with EKG. ? ?Assessment and Plan: ?Assessment and Plan: ?* Left sided colitis ([name]) ?Ischemic versus infectious.  Given persistently elevated Easterly blood cell count, started on antibiotics on 3/8.  Hemoglobin remained stable.  Flexible sigmoidoscopy notes possible ischemic colitis.  Doing well, [name] count almost normalized. ? ?Paroxysmal atrial fibrillation ([name]) ?Patient states he had atrial fibrillation once before, back in the 90s.  Echocardiogram done in 2021 noted no signs of atrial enlargement at that time.  When patient was admitted during this hospitalization, was in normal sinus rhythm.  Rechecking echocardiogram.  Not a good candidate for anticoagulation, at least not at this time.  Would favor for now increasing metoprolol to keep heart rate under control.  Outpatient follow-up with cardiology. ? ?Hematochezia ?Most likely related to bleeding hemorrhoids ?Will consult GI for further evaluation ?Check serial H&H and transfuse as needed ? ?Acute blood loss anemia ?Felt to be secondary to left-sided colitis.  Infectious favored over inflammatory.   Continue to monitor closely, awaiting cultures.  Hemoglobin actually slightly increased today ? ?Essential hypertension ?Blood pressure is stable, slightly softer with atrial fibrillation when rapid. ?Continue lisinopril and have increased metoprolol ? ?Depression, major, single episode, mild ([name]) ?Continue Zoloft. ? ?Alcohol use disorder, moderate, dependence ([name]) ?Patient with a history of alcohol use disorder ?On CIWA protocol and administer lorazepam for CIWA score of 8 or greater ?Patient on MVI, thiamine and folic acid ? ?Obesity ?Meets criteria with BMI greater than 30 ? ?Hyperlipidemia LDL goal <70 ?Continue statin ? ?Constipation ?Related to opioid use for pain control ?We will start patient on MiraLAX 17 g daily as well as senna 1 tablet daily.  Finally starting to have a bowel movement. ? ?Osteoarthritis of hip ?Patient had plans for arthroplasty on Monday, 3/13.  Discussed with patient.  Given now new onset atrial fibrillation, would favor postponing surgery for at least a week. ? ? ? ? ?Body mass index is 30.41 kg/m?.  ?  ?   ? ?Consultants: ?Gastroenterology ? ?Procedures: ?flexible sigmoidoscopy 3/9 ?Echocardiogram: Pending ? ?Antimicrobials: ?IV cefepime 3/8-present ? ?Code Status: Full code ? ? ?Subjective: Patient feeling worn out.  Complains of constipation. ? ?Objective: ?Vital signs were reviewed and unremarkable. ?Vitals:  ? 06/25/21 4315 06/25/21 0740  ?BP: 118/77 114/84  ?Pulse: 72 85  ?Resp:  18  ?Temp:  98.3 ?F (36.8 ?C)  ?SpO2:  98%  ? ? ?Intake/Output Summary (Last 24 hours) at 06/25/2021 1513 ?Last data filed at 06/25/2021 1407 ?Gross per 24 hour  ?Intake 360 ml  ?Output 750 ml  ?Net -390 ml  ? ? ?Filed Weights  ? 06/21/21 2033 06/24/21 0901  ?Weight: 90.7 kg 90.7 kg  ? ?  Body mass index is 30.41 kg/m?. ? ?Exam: ? ?General: Alert and oriented x3, no acute distress ?HEENT: Normocephalic and atraumatic, mucous membranes slightly dry ?Cardiovascular: Irregular rhythm, rate controlled EKG  confirming atrial fibrillation.   ?Respiratory: Clear to auscultation bilaterally ?Abdomen: Soft, nontender, nondistended, positive bowel sounds ?Musculoskeletal: No clubbing or cyanosis, 1+ pitting edema bilaterally ?Skin: No skin breaks, tears or lesions ?Psychiatry: Appropriate, no evidence of psychoses ?Neurology: No focal deficits ? ?Data Reviewed: ?Magnesium level of 1.8.  [place]man count down to 11.3 and hemoglobin up to 11.1. ? ?Disposition:  ?Status is: Inpatient ?Remains inpatient appropriate because: Evaluation of atrial fibrillation ? ?Family Communication: Sister at the bedside ?DVT Prophylaxis: ?SCDs Start: 06/22/21 1025 ? ? ? ?Author: ?Annita Brod ,MD ?06/25/2021 3:13 PM ? ?To reach On-call, see care teams to locate the attending and reach out via [email or web address]. ?Between 7PM-7AM, please contact night-coverage ?If you still have difficulty reaching the attending provider, please page the [facility] (Director on Call) for [facility] on amion for assistance. ? ?

## 2021-06-25 NOTE — Progress Notes (Signed)
Patient's HR improved after cardizem was given.   ?BP 118/77, HR 72.  Will continue to monitor.   ?Cameron Foster  06/25/2021  6:37 AM ? ? ? ?

## 2021-06-25 NOTE — Progress Notes (Signed)
Physical Therapy Treatment ?Patient Details ?Name: Cameron Foster. ?MRN: [id number] ?DOB: 1940-05-14 ?Today's Date: 06/25/2021 ? ? ?History of Present Illness Aaban Griep. is a 81 y.o. male with medical history of chronic alcohol use, paroxysmal A-fib not on anticoagulation, CVA, hypertension, history of upper GI bleed secondary to pyloric ulcer who presents with bright red blood per rectum. ? ?  ?PT Comments  ? ?  ?Pt napping on entry, but agreeable to session. Per RN RVR has been more easily provoked this date and with higher rates of tachycardia. Session focus on static balance in sitting and standing. HR widely variable from 110s-150s in session, difficult to say if more provoked in standing v sitting EOB. Pt monitored for signs of cardiac ischemia in session, of which there are none.  ?   ?Recommendations for follow up therapy are one component of a multi-disciplinary discharge planning process, led by the attending physician.  Recommendations may be updated based on patient status, additional functional criteria and insurance authorization. ? ?Follow Up Recommendations ? Home health PT ?  ?  ?Assistance Recommended at Discharge Intermittent Supervision/Assistance  ?Patient can return home with the following A little help with walking and/or transfers;A little help with bathing/dressing/bathroom;Assistance with cooking/housework;Assist for transportation ?  ?Equipment Recommendations ? Rolling walker (2 wheels)  ?  ?Recommendations for Other Services   ? ? ?  ?Precautions / Restrictions Precautions ?Precautions: Fall  ?  ? ?Mobility ? Bed Mobility ?Overal bed mobility: Needs Assistance ?Bed Mobility: Supine to Sit, Sit to Supine ?  ?  ?Supine to sit: Supervision ?Sit to supine: Supervision ?  ?  ?  ? ?Transfers ?Overall transfer level: Needs assistance ?Equipment used: Rolling walker (2 wheels) ?Transfers: Sit to/from Stand ?Sit to Stand: Min guard, From elevated surface ?  ?  ?  ?  ?  ?General transfer  comment: 1 false start each attempt ?  ? ?Ambulation/Gait ?Ambulation/Gait assistance:  (deferred due to RVR today) ?  ?  ?  ?  ?  ?  ?  ? ? ?Stairs ?  ?  ?  ?  ?  ? ? ?Wheelchair Mobility ?  ? ?Modified Rankin (Stroke Patients Only) ?  ? ? ?  ?Balance   ?  ?  ?  ?  ?  ?  ?  ?  ?  ?  ?  ?  ?  ?  ?  ?  ?  ?  ?  ? ?  ?Cognition Arousal/Alertness: Awake/alert ?Behavior During Therapy: [facility] for tasks assessed/performed ?Overall Cognitive Status: Within Functional Limits for tasks assessed ?  ?  ?  ?  ?  ?  ?  ?  ?  ?  ?  ?  ?  ?  ?  ?  ?  ?  ?  ? ?  ?Exercises Other Exercises ?Other Exercises: static balance with upright posture 30sec c BUE support, 30sec c 1UE support, 1 minutes with no UE support ?Other Exercises: Single UE support + horizontal head turns x10, vertical head turns x10, marching in place x10 ?Other Exercises: seated lateral trunk flexion and lean 1x8 bilat ? ?  ?General Comments   ?  ?  ? ?Pertinent Vitals/Pain Pain Assessment ?Pain Assessment: No/denies pain (reports hip pain with AMB earlier in day)  ? ? ?Home Living   ?  ?  ?  ?  ?  ?  ?  ?  ?  ?   ?  ?Prior Function    ?  ?  ?   ? ?  PT Goals (current goals can now be found in the care plan section) Acute Rehab PT Goals ?Patient Stated Goal: to get stronger before going home ?PT Goal Formulation: With patient ?Time For Goal Achievement: 07/07/21 ?Potential to Achieve Goals: Fair ?Progress towards PT goals: Progressing toward goals ? ?  ?Frequency ? ? ? Min 2X/week ? ? ? ?  ?PT Plan    ? ? ?Co-evaluation   ?  ?  ?  ?  ? ?  ?AM-PAC PT "6 Clicks" Mobility   ?Outcome Measure ? Help needed turning from your back to your side while in a flat bed without using bedrails?: A Little ?Help needed moving from lying on your back to sitting on the side of a flat bed without using bedrails?: A Little ?Help needed moving to and from a bed to a chair (including a wheelchair)?: A Little ?Help needed standing up from a chair using your arms (e.g., wheelchair or bedside  chair)?: A Little ?Help needed to walk in hospital room?: A Little ?Help needed climbing 3-5 steps with a railing? : A Lot ?6 Click Score: 17 ? ?  ?End of Session Equipment Utilized During Treatment: Oxygen ?Activity Tolerance: Patient tolerated treatment well;No increased pain;Treatment limited secondary to medical complications (Comment) ?Patient left: with call bell/phone within reach;in bed ?Nurse Communication: Mobility status ?PT Visit Diagnosis: Unsteadiness on feet (R26.81);Difficulty in walking, not elsewhere classified (R26.2);Other abnormalities of gait and mobility (R26.89);Muscle weakness (generalized) (M62.81) ?  ? ? ?Time: 4944-7395 ?PT Time Calculation (min) (ACUTE ONLY): 19 min ? ?Charges:  $Therapeutic Activity: 8-22 mins          ?          ?3:35 PM, 06/25/21 ?Etta Grandchild, PT, DPT ?Physical Therapist - [place] ?[facility]  ?[phone] (ASCOM)  ? ? ? ?Leelyn Jasinski C ?06/25/2021, 3:33 PM ? ?

## 2021-06-25 NOTE — Assessment & Plan Note (Addendum)
Patient states he had atrial fibrillation once before, back in the 90s.  Echocardiogram done in 2021 noted no signs of atrial enlargement at that time.  When patient was admitted during this hospitalization, was in normal sinus rhythm.  Now staying in atrial fibrillation. Echocardiogram notes mild systolic heart failure and mildly dilated atria which are new compared to echocardiogram from 2 years ago.  Not a good candidate for anticoagulation, at least not at this time given recent bleed.  Have titrated up his Toprol-XL to 100 from 50 starting 3/11 morning.  Started on Cardizem drip 3/11 evening when he went into rapid atrial fibrillation.  Despite bolus and infusion at 10, still continued to have resting rapid ventricular rate so loaded with digoxin starting 3/12 morning.  Patient at times even with mild exertion will start to have significantly elevated heart rate in the 120s.  Have consulted cardiology.

## 2021-06-25 NOTE — Progress Notes (Signed)
Messaged NP Sharion Settler about patient being in Afib RVR again with HR reaching 147.  Another dose of IV metoprolol given.  Will reassess in 20 minutes and notify NP if HR does not improve.   ?Christene Slates  06/25/2021  4:15 AM ? ? ? ?

## 2021-06-26 ENCOUNTER — Inpatient Hospital Stay (HOSPITAL_COMMUNITY)
Admit: 2021-06-26 | Discharge: 2021-06-26 | Disposition: A | Discharge status: Home / Self Care | Payer: Medicare Other | Attending: Internal Medicine | Admitting: Internal Medicine

## 2021-06-26 ENCOUNTER — Inpatient Hospital Stay: Payer: Medicare Other

## 2021-06-26 DIAGNOSIS — I4891 Unspecified atrial fibrillation: Secondary | ICD-10-CM

## 2021-06-26 DIAGNOSIS — D62 Acute posthemorrhagic anemia: Secondary | ICD-10-CM | POA: Diagnosis not present

## 2021-06-26 DIAGNOSIS — I5021 Acute systolic (congestive) heart failure: Secondary | ICD-10-CM | POA: Clinically undetermined

## 2021-06-26 DIAGNOSIS — K51511 Left sided colitis with rectal bleeding: Secondary | ICD-10-CM | POA: Diagnosis not present

## 2021-06-26 DIAGNOSIS — I5023 Acute on chronic systolic (congestive) heart failure: Secondary | ICD-10-CM | POA: Clinically undetermined

## 2021-06-26 DIAGNOSIS — I480 Paroxysmal atrial fibrillation: Secondary | ICD-10-CM | POA: Diagnosis not present

## 2021-06-26 LAB — CBC
HCT: 33.7 % — ABNORMAL LOW (ref 39.0–52.0)
Hemoglobin: 10.8 g/dL — ABNORMAL LOW (ref 13.0–17.0)
MCH: 29 pg (ref 26.0–34.0)
MCHC: 32 g/dL (ref 30.0–36.0)
MCV: 90.6 fL (ref 80.0–100.0)
Platelets: 246 10*3/uL (ref 150–400)
RBC: 3.72 MIL/uL — ABNORMAL LOW (ref 4.22–5.81)
RDW: 12.9 % (ref 11.5–15.5)
WBC: 13.4 10*3/uL — ABNORMAL HIGH (ref 4.0–10.5)
nRBC: 0 % (ref 0.0–0.2)

## 2021-06-26 LAB — ECHOCARDIOGRAM COMPLETE
AR max vel: 2.23 cm2
AV Peak grad: 7.2 mmHg
Ao pk vel: 1.34 m/s
Area-P 1/2: 3.08 cm2
Calc EF: 45.9 %
Height: 68 in
MV VTI: 2.49 cm2
S' Lateral: 3.5 cm
Single Plane A2C EF: 59.4 %
Single Plane A4C EF: 32.6 %
Weight: 3199.99 oz

## 2021-06-26 LAB — BASIC METABOLIC PANEL
Anion gap: 6 (ref 5–15)
BUN: 7 mg/dL — ABNORMAL LOW (ref 8–23)
CO2: 27 mmol/L (ref 22–32)
Calcium: 8.4 mg/dL — ABNORMAL LOW (ref 8.9–10.3)
Chloride: 103 mmol/L (ref 98–111)
Creatinine, Ser: 1.15 mg/dL (ref 0.61–1.24)
GFR, Estimated: >60 mL/min (ref >60)
Glucose, Bld: 114 mg/dL — ABNORMAL HIGH (ref 70–99)
Potassium: 3.1 mmol/L — ABNORMAL LOW (ref 3.5–5.1)
Sodium: 136 mmol/L (ref 135–145)

## 2021-06-26 LAB — BRAIN NATRIURETIC PEPTIDE: B Natriuretic Peptide: 662.7 pg/mL — ABNORMAL HIGH (ref 0.0–100.0)

## 2021-06-26 LAB — PROCALCITONIN: Procalcitonin: 0.11 ng/mL

## 2021-06-26 IMAGING — DX DG CHEST 1V PORT
1 series · 1 of 1 positions shown · non-contrast
Comparison: [DATE]

CLINICAL DATA: Acute diastolic CHF.

EXAM:
PORTABLE CHEST 1 VIEW

[chest ap]
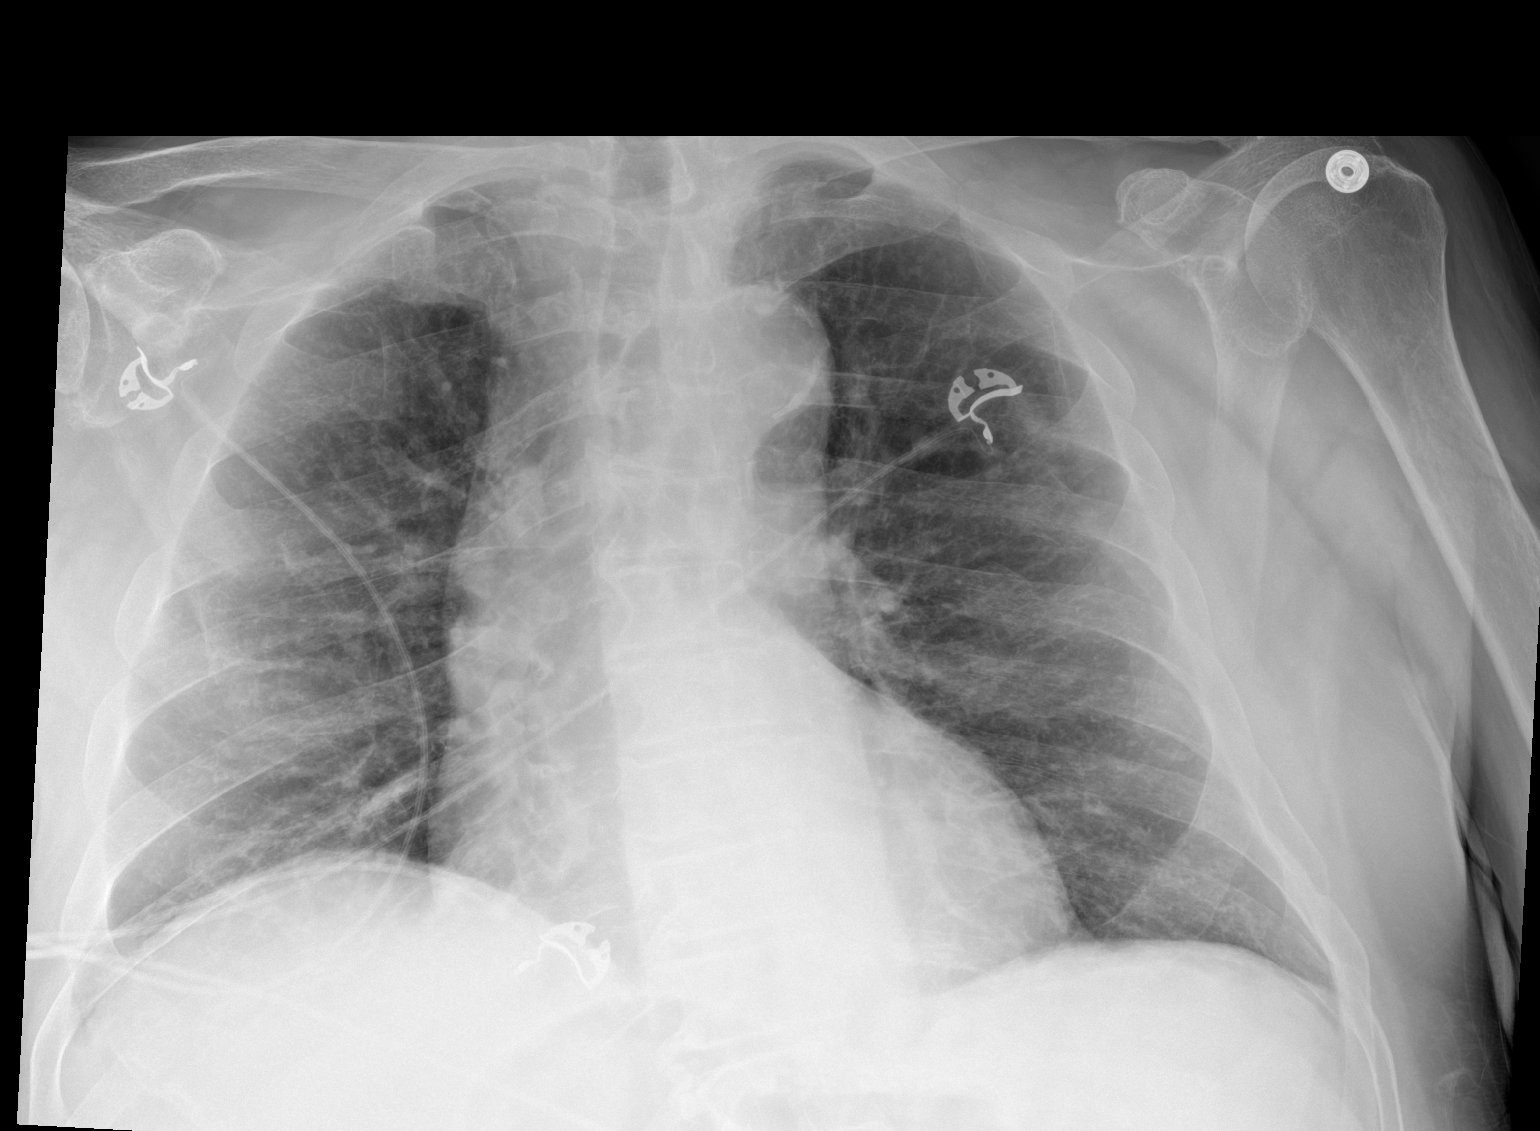

[1 of 1 positions shown; findings below may reference images not displayed]

FINDINGS: Heart size and pulmonary vascularity are normal. Mild linear
atelectasis in the lung bases. Lungs are otherwise clear. No pleural
effusions. No pneumothorax. Calcification of the aorta. Degenerative
changes in the spine. Old rib fractures.
IMPRESSION: No active disease.

## 2021-06-26 MED ORDER — DILTIAZEM HCL 25 MG/5ML IV SOLN
20.0000 mg | Freq: Once | INTRAVENOUS | Status: AC
  Administered 2021-06-26: 20 mg via INTRAVENOUS
  Filled 2021-06-26: qty 5

## 2021-06-26 MED ORDER — DILTIAZEM HCL-DEXTROSE 125-5 MG/125ML-% IV SOLN (PREMIX)
5.0000 mg/h | INTRAVENOUS | Status: DC
  Administered 2021-06-26 – 2021-06-28 (×3): 5 mg/h via INTRAVENOUS
  Filled 2021-06-26 (×5): qty 125

## 2021-06-26 MED ORDER — METOPROLOL SUCCINATE ER 25 MG PO TB24
25.0000 mg | ORAL_TABLET | ORAL | Status: AC
  Administered 2021-06-26: 25 mg via ORAL
  Filled 2021-06-26: qty 1

## 2021-06-26 MED ORDER — FUROSEMIDE 10 MG/ML IJ SOLN
20.0000 mg | Freq: Once | INTRAMUSCULAR | Status: AC
  Administered 2021-06-26: 20 mg via INTRAVENOUS
  Filled 2021-06-26: qty 4

## 2021-06-26 MED ORDER — POTASSIUM CHLORIDE CRYS ER 20 MEQ PO TBCR
40.0000 meq | EXTENDED_RELEASE_TABLET | Freq: Two times a day (BID) | ORAL | Status: DC
  Administered 2021-06-26 – 2021-07-02 (×13): 40 meq via ORAL
  Filled 2021-06-26 (×13): qty 2

## 2021-06-26 MED ORDER — PERFLUTREN LIPID MICROSPHERE
1.0000 mL | INTRAVENOUS | Status: AC | PRN
  Administered 2021-06-26: 3 mL via INTRAVENOUS
  Filled 2021-06-26: qty 10

## 2021-06-26 MED ORDER — AMOXICILLIN-POT CLAVULANATE 875-125 MG PO TABS
1.0000 | ORAL_TABLET | Freq: Two times a day (BID) | ORAL | Status: DC
  Administered 2021-06-27 – 2021-06-29 (×5): 1 via ORAL
  Filled 2021-06-26 (×5): qty 1

## 2021-06-26 MED ORDER — METOPROLOL SUCCINATE ER 100 MG PO TB24
100.0000 mg | ORAL_TABLET | Freq: Every day | ORAL | Status: DC
  Administered 2021-06-27 – 2021-06-28 (×2): 100 mg via ORAL
  Filled 2021-06-26 (×2): qty 1

## 2021-06-26 MED ORDER — DILTIAZEM LOAD VIA INFUSION
10.0000 mg | Freq: Once | INTRAVENOUS | Status: AC
  Administered 2021-06-26: 10 mg via INTRAVENOUS
  Filled 2021-06-26: qty 10

## 2021-06-26 NOTE — Progress Notes (Signed)
Patient's HR remained elevated after metoprolol was given so NP Sharion Settler ordered one time dose of cardizem IV.  Last reading on monitor read HR 100.  Will continue to monitor. ?Cameron Foster  06/26/2021  5:59 AM ? ? ? ?

## 2021-06-26 NOTE — Progress Notes (Signed)
[facility] Progress Note ? ?Patient: Cameron Foster.    JME:[id number]  DOA: 06/22/2021    ?Date of Service: the patient was seen and examined on 06/26/2021 ? ?Brief hospital course: ?81 year old male past medical history of alcoholism, atrial fibrillation previous GI bleed from pyloric ulcer as well as diverticulosis presented to the emergency room on 3/7 with complaints of rectal bleeding/bright red blood per rectum and hemoglobin noted to be 10.4 (baseline of 13.2).  Admitted to the hospitalist service & GI consulted. CT angiogram noted left-sided colitis felt to be infectious versus inflammatory. ? ?Hemoglobin remained stable and actually improved to 11.1 x 3/10.  However, on early morning of 3/10, patient went into new onset rapid atrial fibrillation.  Heart rate improved although still not fully controlled.  Remaining in atrial fibrillation.  Echocardiogram done noted mild decrease ejection fraction of 45-50%.  Patient slightly volume overloaded and received IV Lasix. ? ?Assessment and Plan: ?Assessment and Plan: ?* Left sided colitis ([place]) ?Ischemic versus infectious.  Given persistently elevated [place] blood cell count, started on antibiotics on 3/8.  Hemoglobin remained stable.  Flexible sigmoidoscopy notes possible ischemic colitis.  Doing well, G[place]reaux count almost normalized. ? ?Paroxysmal atrial fibrillation ([place]) ?Patient states he had atrial fibrillation once before, back in the 90s.  Echocardiogram done in 2021 noted no signs of atrial enlargement at that time.  When patient was admitted during this hospitalization, was in normal sinus rhythm.  Now staying in atrial fibrillation.  Echocardiogram notes mild dilatation of left atrium.  Rechecking echocardiogram.  Not a good candidate for anticoagulation, at least not at this time given recent bleed.  Continue to trend up metoprolol to keep heart rate below 90.  Outpatient follow-up with cardiology. ? ?Acute clinical systolic heart failure  ([place]) ?Noted on echocardiogram.  Ejection fraction of 45 to 50%.  Mild decreased in systolic function when compared with echocardiogram 2 years ago.  Likely exacerbated by volume resuscitation following bleed.  Mild.  Received IV Lasix x2.  Now appears to be euvolemic. ? ?Hematochezia ?Most likely related to bleeding hemorrhoids ?Will consult GI for further evaluation ?Check serial H&H and transfuse as needed ? ?Acute blood loss anemia ?Felt to be secondary to left-sided colitis.  Infectious favored over inflammatory.  Continue to monitor closely.  Cultures unremarkable.  Hemoglobin remained stable. ? ?Essential hypertension ?Blood pressure is stable, slightly softer with atrial fibrillation when rapid. ?Continue lisinopril and have increased metoprolol ? ?Depression, major, single episode, mild ([place]) ?Continue Zoloft. ? ?Alcohol use disorder, moderate, dependence ([place]) ?Patient with a history of alcohol use disorder ?On CIWA protocol and administer lorazepam for CIWA score of 8 or greater ?Patient on MVI, thiamine and folic acid ? ?Obesity ?Meets criteria with BMI greater than 30 ? ?Hyperlipidemia LDL goal <70 ?Continue statin ? ?Constipation ?Related to opioid use for pain control ?We will start patient on MiraLAX 17 g daily as well as senna 1 tablet daily.  Finally starting to have a bowel movement. ? ?Osteoarthritis of hip ?Patient had plans for arthroplasty on Monday, 3/13.  Discussed with patient.  Given now new onset atrial fibrillation, would favor postponing surgery for at least a week. ? ? ? ? ?Body mass index is 30.41 kg/m?.  ?  ?   ? ?Consultants: ?Gastroenterology ? ?Procedures: ?flexible sigmoidoscopy 3/9 ?Echocardiogram: Ejection fraction of 45 to 50%.  Mildly dilated left atria ? ?Antimicrobials: ?IV cefepime 3/8-present ? ?Code Status: Full code ? ? ?Subjective: Patient feeling worn out.  Constipation  resolved going often. ? ?Objective: ?Vital signs were reviewed and unremarkable. ?Vitals:  ?  06/25/21 2316 06/26/21 0414  ?BP: (!) 147/101 113/83  ?Pulse: (!) 104 95  ?Resp:  20  ?Temp:  98.8 ?F (37.1 ?C)  ?SpO2: 99% 99%  ? ? ?Intake/Output Summary (Last 24 hours) at 06/26/2021 1542 ?Last data filed at 06/26/2021 1409 ?Gross per 24 hour  ?Intake 1790.14 ml  ?Output 720 ml  ?Net 1070.14 ml  ? ? ?Filed Weights  ? 06/21/21 2033 06/24/21 0901  ?Weight: 90.7 kg 90.7 kg  ? ?Body mass index is 30.41 kg/m?. ? ?Exam: ? ?General: Alert and oriented x3, no acute distress ?HEENT: Normocephalic and atraumatic, mucous membranes slightly dry ?Cardiovascular: Irregular rhythm, borderline tachycardia ?Respiratory: Clear to auscultation bilaterally ?Abdomen: Soft, nontender, nondistended, positive bowel sounds ?Musculoskeletal: No clubbing or cyanosis, 1+ pitting edema bilaterally ?Skin: No skin breaks, tears or lesions ?Psychiatry: Appropriate, no evidence of psychoses ?Neurology: No focal deficits ? ?Data Reviewed: ?Noted slight increase in Guttierrez count of 13.  Hemoglobin stable. ? ?Disposition:  ?Status is: Inpatient ?Remains inpatient appropriate because: Control of heart rate.  Anticipate discharge 3/12. ? ?Family Communication: Sister at the bedside ?DVT Prophylaxis: ?SCDs Start: 06/22/21 1025 ? ? ? ?Author: ?Annita Brod ,MD ?06/26/2021 3:42 PM ? ?To reach On-call, see care teams to locate the attending and reach out via [email or web address]. ?Between 7PM-7AM, please contact night-coverage ?If you still have difficulty reaching the attending provider, please page the [facility] (Director on Call) for [facility] on amion for assistance. ? ?

## 2021-06-26 NOTE — Assessment & Plan Note (Signed)
Noted on echocardiogram.  Ejection fraction of 45 to 50%.  Mild decreased in systolic function when compared with echocardiogram 2 years ago.  Likely exacerbated by volume resuscitation following bleed.  Mild.  Received IV Lasix x2.  Now appears to be euvolemic. ?

## 2021-06-26 NOTE — Progress Notes (Signed)
Mobility Specialist - Progress Note ? ? 06/26/21 1400  ?Mobility  ?Activity Refused mobility  ? ? ? ?Pt HR 140-150 while sleeping, holding mobility per RN. Second attempt to date but will return when medically appropriate. ? ?Cameron Foster ?Mobility Specialist ?06/26/21, 2:48 PM ? ? ? ? ?

## 2021-06-26 NOTE — TOC Progression Note (Signed)
Transition of Care (TOC) - Progression Note  ? ? ?Patient Details  ?Name: Cameron Foster. ?MRN: [id number] ?Date of Birth: Dec 12, 1940 ? ?Transition of Care (TOC) CM/SW Contact  ?Izola Price, RN ?Phone Number: ?06/26/2021, 12:47 PM ? ?Clinical Narrative: 3/11: Rounds: Echocardiogram today for new Afib RVR. Provider indicated rounds that patient was due for arthroplasty (PTA) this Monday and PT may need to wait till after that for most benefit. EDD Monday?  RW needed on discharge. Declined SA resources per prior CM notes. TOC to follow for discharge planning needs. Simmie Davies RN CM  ? ? ? ?Expected Discharge Plan: [place] ?Barriers to Discharge: Continued Medical Work up ? ?Expected Discharge Plan and Services ?Expected Discharge Plan: [place] ?  ?  ?  ?Living arrangements for the past 2 months: [place] ?                ?DME Arranged: Walker rolling ?DME Agency: [employer] ?Date DME Agency Contacted: 06/24/21 ?Time DME Agency Contacted: [phone] ?Representative spoke with at DME Agency: Andee Poles ?HH Arranged: PT ?[place] Agency: [place] ([place] ?Date HH Agency Contacted: 06/24/21 ?Time [place]: [phone] ?Representative spoke with at [place]: [name] ? ? ?Social Determinants of Health (SDOH) Interventions ?  ? ?Readmission Risk Interventions ?No flowsheet data found. ? ?

## 2021-06-26 NOTE — Plan of Care (Signed)
Patient occasionally sustaining above 140 for five minutes or more.  Dr. Text to inform and requested PRN to assist in lowering as needed. ?

## 2021-06-27 ENCOUNTER — Inpatient Hospital Stay: Payer: Medicare Other

## 2021-06-27 ENCOUNTER — Other Ambulatory Visit: Payer: Self-pay

## 2021-06-27 DIAGNOSIS — D62 Acute posthemorrhagic anemia: Secondary | ICD-10-CM | POA: Diagnosis not present

## 2021-06-27 DIAGNOSIS — I480 Paroxysmal atrial fibrillation: Secondary | ICD-10-CM | POA: Diagnosis not present

## 2021-06-27 DIAGNOSIS — K51511 Left sided colitis with rectal bleeding: Secondary | ICD-10-CM | POA: Diagnosis not present

## 2021-06-27 DIAGNOSIS — I5021 Acute systolic (congestive) heart failure: Secondary | ICD-10-CM | POA: Diagnosis not present

## 2021-06-27 LAB — BASIC METABOLIC PANEL
Anion gap: 8 (ref 5–15)
BUN: 7 mg/dL — ABNORMAL LOW (ref 8–23)
CO2: 26 mmol/L (ref 22–32)
Calcium: 8.6 mg/dL — ABNORMAL LOW (ref 8.9–10.3)
Chloride: 102 mmol/L (ref 98–111)
Creatinine, Ser: 1.13 mg/dL (ref 0.61–1.24)
GFR, Estimated: >60 mL/min (ref >60)
Glucose, Bld: 125 mg/dL — ABNORMAL HIGH (ref 70–99)
Potassium: 3.6 mmol/L (ref 3.5–5.1)
Sodium: 136 mmol/L (ref 135–145)

## 2021-06-27 LAB — CULTURE, BLOOD (ROUTINE X 2)
Culture: NO GROWTH
Culture: NO GROWTH
Special Requests: ADEQUATE
Special Requests: ADEQUATE

## 2021-06-27 LAB — CBC
HCT: 33.7 % — ABNORMAL LOW (ref 39.0–52.0)
Hemoglobin: 10.7 g/dL — ABNORMAL LOW (ref 13.0–17.0)
MCH: 28.9 pg (ref 26.0–34.0)
MCHC: 31.8 g/dL (ref 30.0–36.0)
MCV: 91.1 fL (ref 80.0–100.0)
Platelets: 258 10*3/uL (ref 150–400)
RBC: 3.7 MIL/uL — ABNORMAL LOW (ref 4.22–5.81)
RDW: 12.9 % (ref 11.5–15.5)
WBC: 11.5 10*3/uL — ABNORMAL HIGH (ref 4.0–10.5)
nRBC: 0 % (ref 0.0–0.2)

## 2021-06-27 LAB — TROPONIN I (HIGH SENSITIVITY)
Troponin I (High Sensitivity): 18 ng/L — ABNORMAL HIGH (ref <18)
Troponin I (High Sensitivity): 14 ng/L (ref <18)

## 2021-06-27 LAB — D-DIMER, QUANTITATIVE: D-Dimer, Quant: 9.33 ug/mL-FEU — ABNORMAL HIGH (ref 0.00–0.50)

## 2021-06-27 IMAGING — DX DG CHEST 1V PORT
1 series · 2 of 2 positions shown · non-contrast
Comparison: Chest x-ray [DATE].

CLINICAL DATA: 80-year-old male with history of sudden onset of
chest pain at 4 a.m.

EXAM:
PORTABLE CHEST 1 VIEW

[Series 1: chest ap · 0.14mm/px · 2 of 2 slices shown]
[im 1/2]
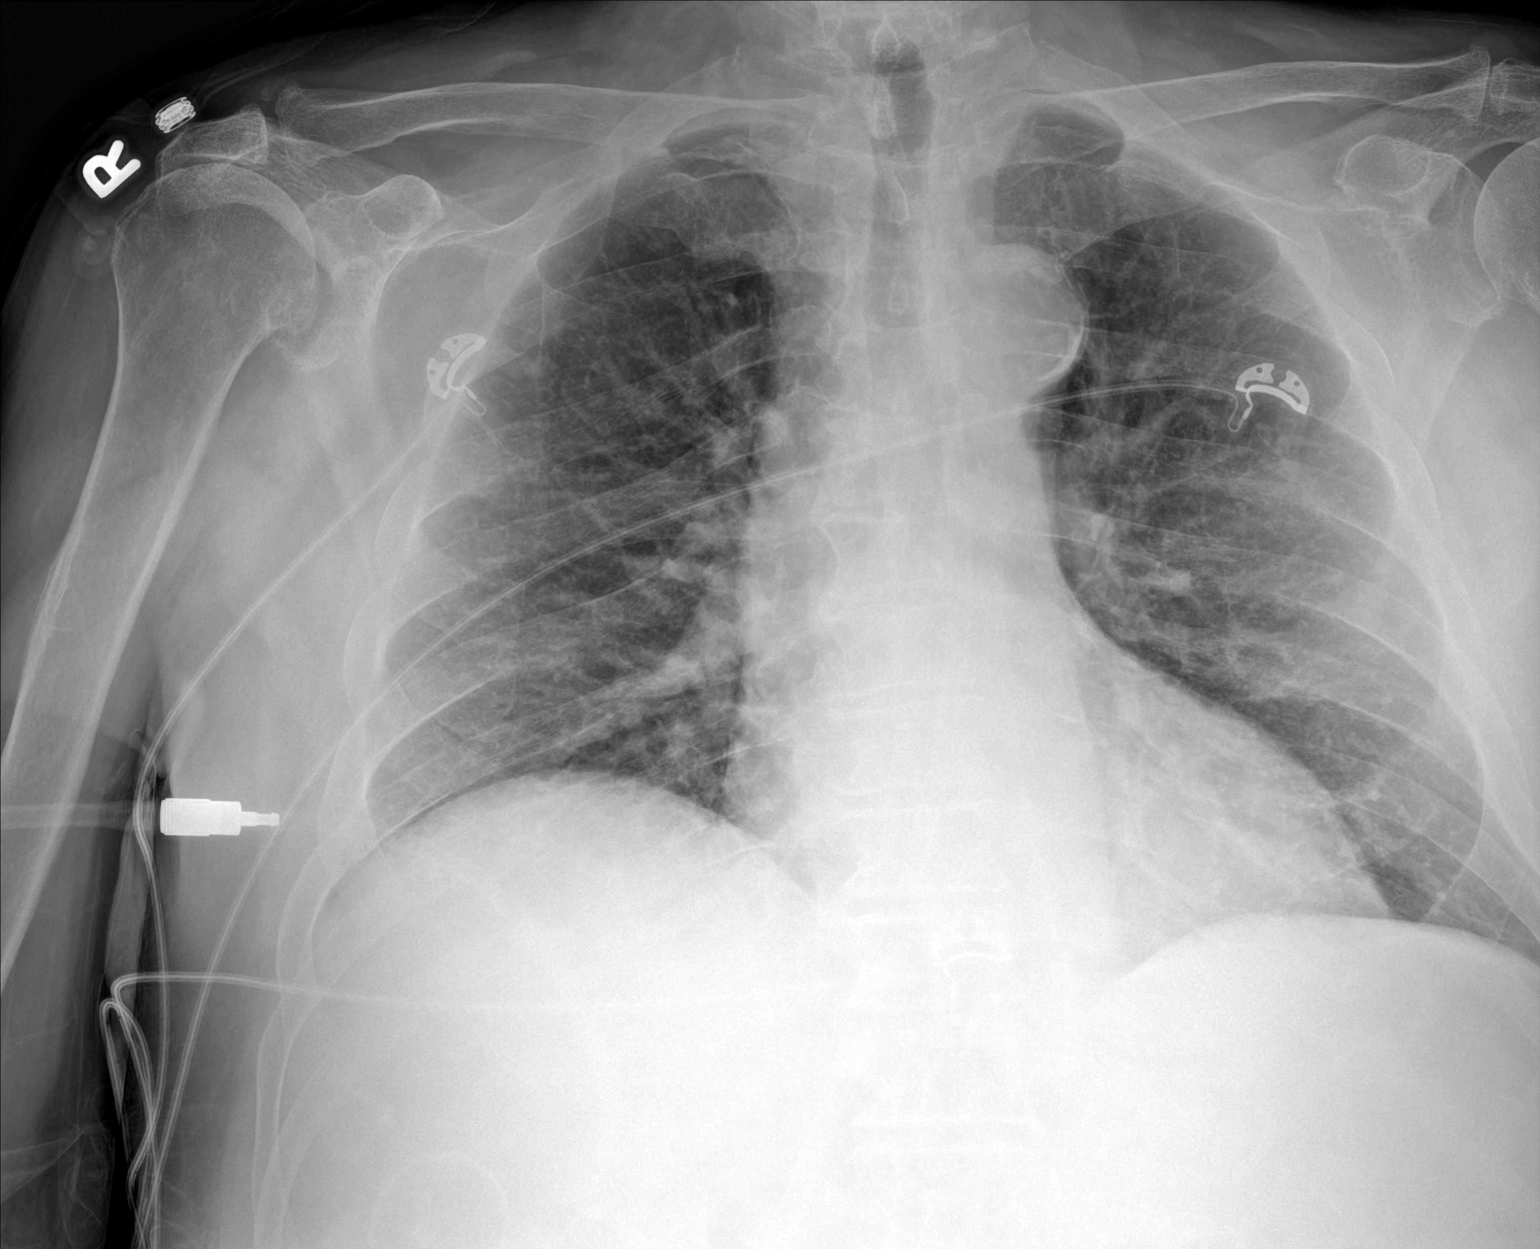
[im 2/2]
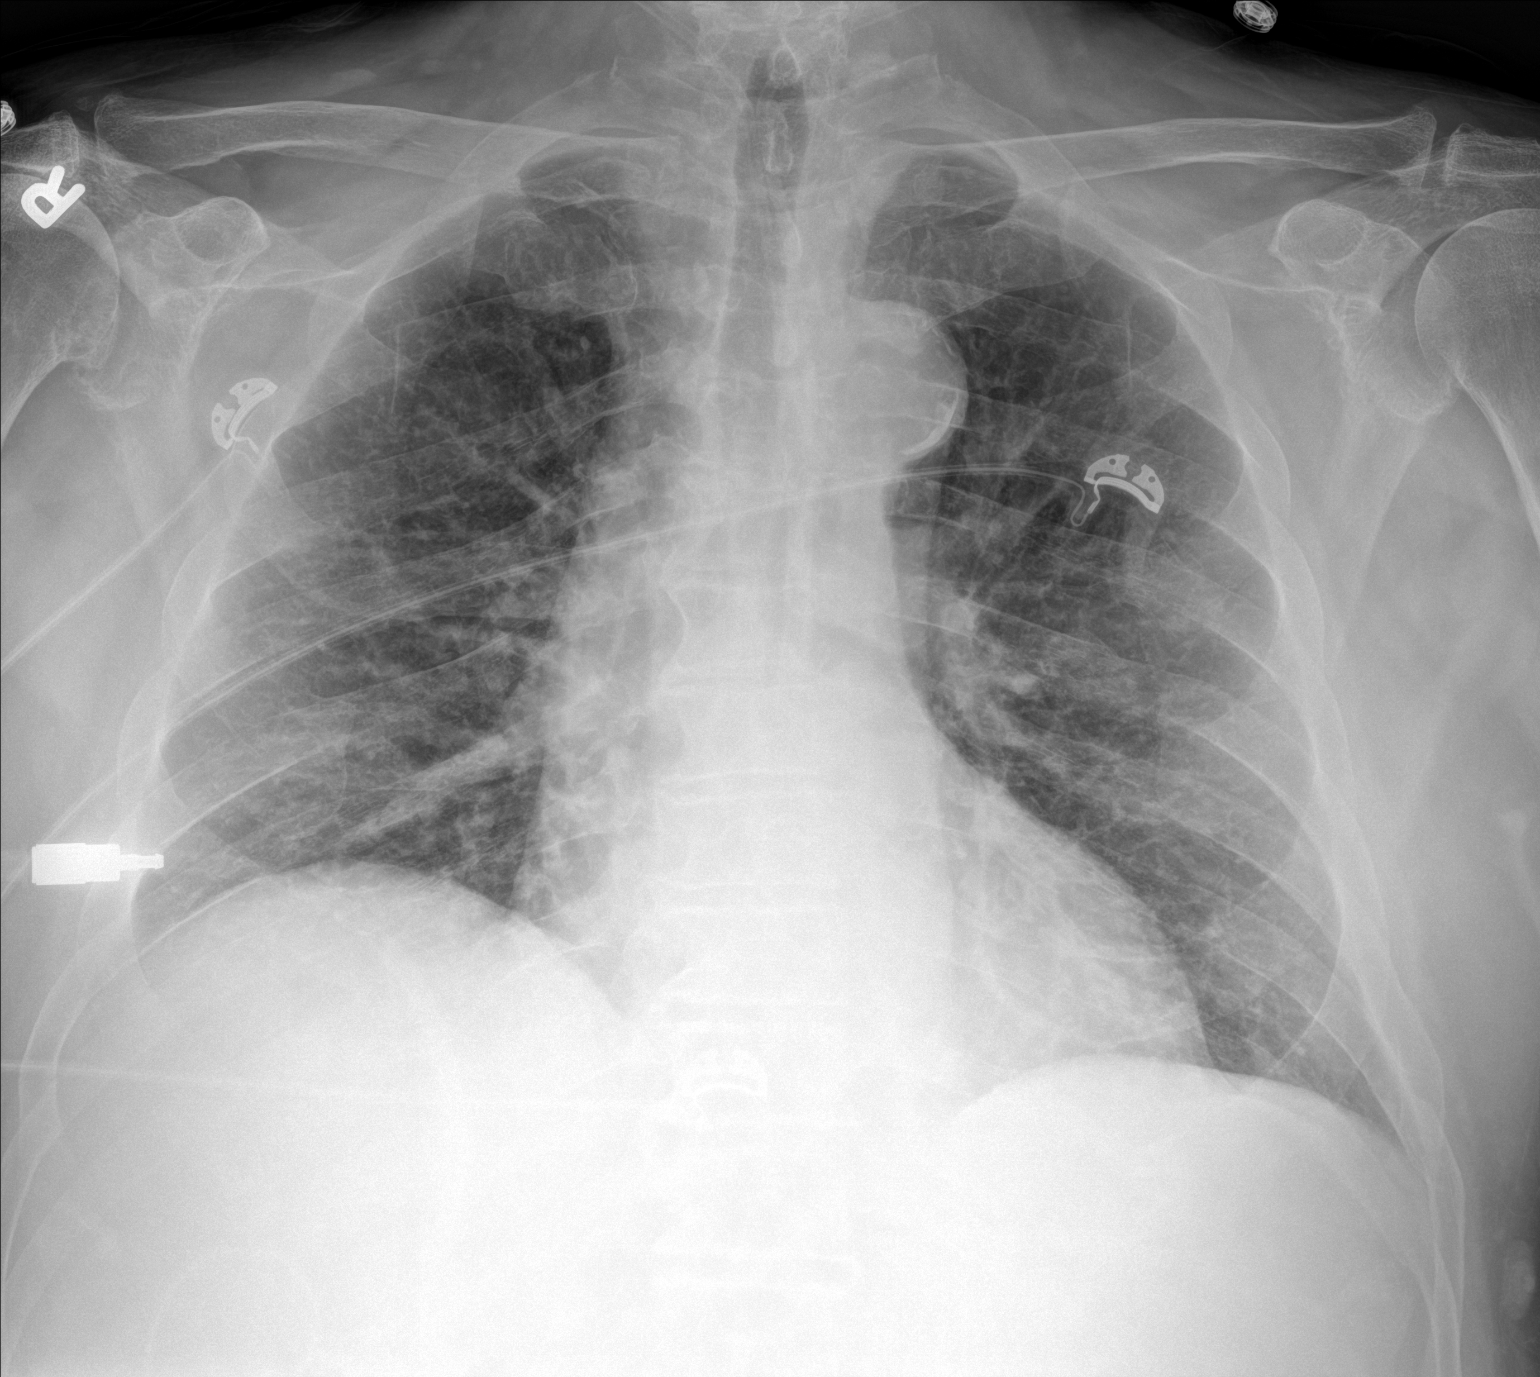

[2 of 2 positions shown; findings below may reference images not displayed]

FINDINGS: Lung volumes are low. Diffuse peribronchial cuffing, similar to
prior examinations. No consolidative airspace disease. No pleural
effusions. No pneumothorax. No pulmonary nodule or mass noted.
Pulmonary vasculature and the cardiomediastinal silhouette are
within normal limits. Atherosclerotic calcifications in the thoracic
aorta.
IMPRESSION: 1. Low lung volumes with diffuse peribronchial cuffing, which
appears to be chronic compared to the prior study, favored to
reflect a chronic bronchitis. No definite acute findings are noted.
2. Aortic atherosclerosis.

## 2021-06-27 MED ORDER — DIGOXIN 0.25 MG/ML IJ SOLN
0.5000 mg | Freq: Every day | INTRAMUSCULAR | Status: AC
Start: 2021-06-27 — End: 2021-06-27
  Administered 2021-06-27: 0.5 mg via INTRAVENOUS
  Filled 2021-06-27: qty 2

## 2021-06-27 MED ORDER — DIGOXIN 0.25 MG/ML IJ SOLN
0.2500 mg | Freq: Once | INTRAMUSCULAR | Status: AC
  Administered 2021-06-27: 0.25 mg via INTRAVENOUS
  Filled 2021-06-27: qty 2

## 2021-06-27 MED ORDER — DIGOXIN 125 MCG PO TABS
0.1250 mg | ORAL_TABLET | Freq: Every day | ORAL | Status: DC
  Administered 2021-06-28: 0.125 mg via ORAL
  Filled 2021-06-27: qty 1

## 2021-06-27 MED ORDER — NICOTINE 21 MG/24HR TD PT24
21.0000 mg | MEDICATED_PATCH | Freq: Every day | TRANSDERMAL | Status: DC
  Administered 2021-06-27: 21 mg via TRANSDERMAL
  Filled 2021-06-27 (×5): qty 1

## 2021-06-27 MED ORDER — FUROSEMIDE 10 MG/ML IJ SOLN
20.0000 mg | Freq: Two times a day (BID) | INTRAMUSCULAR | Status: DC
  Administered 2021-06-27 – 2021-06-29 (×5): 20 mg via INTRAVENOUS
  Filled 2021-06-27 (×5): qty 2

## 2021-06-27 MED ORDER — NITROGLYCERIN 0.4 MG SL SUBL
0.4000 mg | SUBLINGUAL_TABLET | SUBLINGUAL | Status: DC | PRN
  Filled 2021-06-27: qty 1

## 2021-06-27 MED ORDER — MORPHINE SULFATE (PF) 2 MG/ML IV SOLN: 1.0000 mg | INTRAVENOUS | Status: DC | PRN

## 2021-06-27 NOTE — Progress Notes (Addendum)
[facility] Progress Note ? ?Patient: Cameron Foster.    JKK:[id number]  DOA: 06/22/2021    ?Date of Service: the patient was seen and examined on 06/27/2021 ? ?Brief hospital course: ?81 year old male past medical history of alcoholism, atrial fibrillation previous GI bleed from pyloric ulcer as well as diverticulosis presented to the emergency room on 3/7 with complaints of rectal bleeding/bright red blood per rectum and hemoglobin noted to be 10.4 (baseline of 13.2).  Admitted to the hospitalist service & GI consulted. CT angiogram noted left-sided colitis felt to be infectious versus inflammatory. ? ?Hemoglobin remained stable and actually improved to 11.1 x 3/10.  However, on early morning of 3/10, patient went into new onset rapid atrial fibrillation.  Heart rate improved although still not fully controlled.  Remaining in atrial fibrillation.  Echocardiogram done noted mild decrease ejection fraction of 45-50%.  Patient slightly volume overloaded and received IV Lasix.  On evening of 3/11, started going into persistent rapid atrial fibrillation, so patient started on Cardizem drip. ? ?Patient had some chest discomfort on the early morning of 3/12.  Troponin unremarkable.  Chest pain resolved on its own without morphine or nitroglycerin.  EKG noted atrial fibrillation with right bundle branch block and left anterior fascicular block.  Both branch blocks are unchanged from previous EKGs prior to hospitalization. ? ?Assessment and Plan: ?Assessment and Plan: ?* Left sided colitis ([place]) ?Ischemic versus infectious.  Given persistently elevated Ferreri blood cell count, started on antibiotics on 3/8.  Hemoglobin remained stable.  Flexible sigmoidoscopy notes possible ischemic colitis.  Doing well, Reckner count almost normalized. ? ?Paroxysmal atrial fibrillation ([place]) ?Patient states he had atrial fibrillation once before, back in the 90s.  Echocardiogram done in 2021 noted no signs of atrial enlargement at that  time.  When patient was admitted during this hospitalization, was in normal sinus rhythm.  Now staying in atrial fibrillation. Echocardiogram notes mild systolic heart failure and mildly dilated atria which are new compared to echocardiogram from 2 years ago.  Not a good candidate for anticoagulation, at least not at this time given recent bleed.  Have titrated up his Toprol-XL to 100 from 50 starting 3/11 morning.  Started on Cardizem drip 3/11 evening when he went into rapid atrial fibrillation.  Despite bolus and infusion at 10, still continued to have resting rapid ventricular rate so loaded with digoxin starting 3/12 morning.  Patient at times even with mild exertion will start to have significantly elevated heart rate in the 120s.  Have consulted cardiology. ? ?Acute clinical systolic heart failure ([place]) ?Noted on echocardiogram.  Ejection fraction of 45 to 50%.  Mild decreased in systolic function when compared with echocardiogram 2 years ago.  Likely exacerbated by volume resuscitation following bleed.  Mild.  Received IV Lasix x2.  Now appears to be euvolemic. ? ?Hematochezia ?Most likely related to bleeding hemorrhoids ?Will consult GI for further evaluation ?Check serial H&H and transfuse as needed ? ?Acute blood loss anemia ?Felt to be secondary to left-sided colitis.  Infectious favored over inflammatory.  Continue to monitor closely.  Cultures unremarkable.  Hemoglobin remained stable. ? ?Essential hypertension ?Blood pressure is stable, slightly softer with atrial fibrillation when rapid. ?Continue lisinopril and have increased metoprolol ? ?Depression, major, single episode, mild ([place]) ?Continue Zoloft. ? ?Alcohol use disorder, moderate, dependence ([place]) ?Patient with a history of alcohol use disorder ?On CIWA protocol and administer lorazepam for CIWA score of 8 or greater ?Patient on MVI, thiamine and folic acid ? ?  Obesity ?Meets criteria with BMI greater than 30 ? ?Hyperlipidemia LDL goal  <70 ?Continue statin ? ?Constipation ?Related to opioid use for pain control ?We will start patient on MiraLAX 17 g daily as well as senna 1 tablet daily.  Finally starting to have a bowel movement. ? ?Osteoarthritis of hip ?Patient had plans for arthroplasty on Monday, 3/13.  Discussed with patient.  Given now new onset atrial fibrillation, would favor postponing surgery for a few weeks. ? ? ? ? ?Body mass index is 30.41 kg/m?.  ?  ?   ? ?Consultants: ?Gastroenterology ?Cardiology ? ?Procedures: ?flexible sigmoidoscopy 3/9 ?Echocardiogram: Ejection fraction of 45 to 50%.  Mildly dilated left atria ? ?Antimicrobials: ?IV cefepime 3/8-present ? ?Code Status: Full code ? ? ?Subjective: Patient feels better than last night. ? ?Objective: ?Vital signs were reviewed and unremarkable. ?Vitals:  ? 06/27/21 1100 06/27/21 1152  ?BP: 118/77 119/70  ?Pulse:  82  ?Resp: (!) 21 (!) 22  ?Temp:  97.7 ?F (36.5 ?C)  ?SpO2:  100%  ? ? ?Intake/Output Summary (Last 24 hours) at 06/27/2021 1643 ?Last data filed at 06/27/2021 0556 ?Gross per 24 hour  ?Intake 58.36 ml  ?Output 900 ml  ?Net -841.64 ml  ? ?Filed Weights  ? 06/21/21 2033 06/24/21 0901  ?Weight: 90.7 kg 90.7 kg  ? ?Body mass index is 30.41 kg/m?. ? ?Exam: ? ?General: Alert and oriented x3, no acute distress ?HEENT: Normocephalic and atraumatic, mucous membranes slightly dry ?Cardiovascular: Irregular rhythm, borderline tachycardia ?Respiratory: Clear to auscultation bilaterally ?Abdomen: Soft, nontender, nondistended, positive bowel sounds ?Musculoskeletal: No clubbing or cyanosis, 1+ pitting edema bilaterally ?Skin: No skin breaks, tears or lesions ?Psychiatry: Appropriate, no evidence of psychoses ?Neurology: No focal deficits ? ?Data Reviewed: ?Noted slight increase in Vilchis count of 13.  Hemoglobin stable. ? ?Disposition:  ?Status is: Inpatient ?Remains inpatient appropriate because: Further control of his heart rate and see if he needs any additional testing. ? ?Family  Communication: Updated sister by phone. ?DVT Prophylaxis: ?SCDs Start: 06/22/21 1025 ? ? ? ?Author: ?Annita Brod ,MD ?06/27/2021 4:43 PM ? ?To reach On-call, see care teams to locate the attending and reach out via [email or web address]. ?Between 7PM-7AM, please contact night-coverage ?If you still have difficulty reaching the attending provider, please page the [facility] (Director on Call) for [facility] on amion for assistance. ? ?

## 2021-06-28 ENCOUNTER — Inpatient Hospital Stay: Admission: RE | Admit: 2021-06-28 | Payer: Medicare Other | Source: Ambulatory Visit | Admitting: Orthopedic Surgery

## 2021-06-28 ENCOUNTER — Encounter: Admission: RE | Payer: Self-pay | Source: Ambulatory Visit

## 2021-06-28 ENCOUNTER — Other Ambulatory Visit: Payer: Medicare Other

## 2021-06-28 DIAGNOSIS — E876 Hypokalemia: Secondary | ICD-10-CM

## 2021-06-28 DIAGNOSIS — K51511 Left sided colitis with rectal bleeding: Secondary | ICD-10-CM | POA: Diagnosis not present

## 2021-06-28 DIAGNOSIS — I480 Paroxysmal atrial fibrillation: Secondary | ICD-10-CM | POA: Diagnosis not present

## 2021-06-28 DIAGNOSIS — I110 Hypertensive heart disease with heart failure: Secondary | ICD-10-CM | POA: Diagnosis not present

## 2021-06-28 DIAGNOSIS — D6489 Other specified anemias: Secondary | ICD-10-CM

## 2021-06-28 DIAGNOSIS — I5021 Acute systolic (congestive) heart failure: Secondary | ICD-10-CM | POA: Diagnosis not present

## 2021-06-28 LAB — CBC
HCT: 33.3 % — ABNORMAL LOW (ref 39.0–52.0)
Hemoglobin: 10.5 g/dL — ABNORMAL LOW (ref 13.0–17.0)
MCH: 28.7 pg (ref 26.0–34.0)
MCHC: 31.5 g/dL (ref 30.0–36.0)
MCV: 91 fL (ref 80.0–100.0)
Platelets: 253 10*3/uL (ref 150–400)
RBC: 3.66 MIL/uL — ABNORMAL LOW (ref 4.22–5.81)
RDW: 12.7 % (ref 11.5–15.5)
WBC: 10.4 10*3/uL (ref 4.0–10.5)
nRBC: 0 % (ref 0.0–0.2)

## 2021-06-28 LAB — BASIC METABOLIC PANEL
Anion gap: 5 (ref 5–15)
BUN: 6 mg/dL — ABNORMAL LOW (ref 8–23)
CO2: 29 mmol/L (ref 22–32)
Calcium: 8.8 mg/dL — ABNORMAL LOW (ref 8.9–10.3)
Chloride: 102 mmol/L (ref 98–111)
Creatinine, Ser: 1.12 mg/dL (ref 0.61–1.24)
GFR, Estimated: >60 mL/min (ref >60)
Glucose, Bld: 128 mg/dL — ABNORMAL HIGH (ref 70–99)
Potassium: 3.9 mmol/L (ref 3.5–5.1)
Sodium: 136 mmol/L (ref 135–145)

## 2021-06-28 LAB — DIGOXIN LEVEL: Digoxin Level: 1.4 ng/mL (ref 0.8–2.0)

## 2021-06-28 LAB — MAGNESIUM: Magnesium: 1.9 mg/dL (ref 1.7–2.4)

## 2021-06-28 LAB — TSH: TSH: 3.077 u[IU]/mL (ref 0.350–4.500)

## 2021-06-28 LAB — BRAIN NATRIURETIC PEPTIDE: B Natriuretic Peptide: 569.3 pg/mL — ABNORMAL HIGH (ref 0.0–100.0)

## 2021-06-28 SURGERY — ARTHROPLASTY, HIP, TOTAL, ANTERIOR APPROACH
Anesthesia: Spinal | Site: Hip | Laterality: Right

## 2021-06-28 MED ORDER — METOPROLOL TARTRATE 25 MG PO TABS
25.0000 mg | ORAL_TABLET | Freq: Four times a day (QID) | ORAL | Status: DC
  Administered 2021-06-28 – 2021-06-29 (×3): 25 mg via ORAL
  Filled 2021-06-28 (×4): qty 1

## 2021-06-28 MED ORDER — METOPROLOL TARTRATE 25 MG PO TABS: 25.0000 mg | ORAL_TABLET | Freq: Four times a day (QID) | ORAL | Status: DC

## 2021-06-28 NOTE — Progress Notes (Signed)
Physical Therapy Treatment ?Patient Details ?Name: Cameron Foster. ?MRN: [id number] ?DOB: [birth year] ?Today's Date: 06/28/2021 ? ? ?History of Present Illness Cameron Foster. is a 81 y.o. male with medical history of chronic alcohol use, paroxysmal A-fib not on anticoagulation, CVA, hypertension, history of upper GI bleed secondary to pyloric ulcer who presents with bright red blood per rectum. ? ?  ?PT Comments  ? ? Pt received in bed with RN at bedside. Treatment limited due to continuous a-fib with RVR with HR at rest ranging from mid 80's to upper 140's BPM. Pt performing standing static balance tasks at bedside with monitoring of HR. Intermittent need for VC's to prevent hand placement with good carryover. Noted ankle strategy challenge with reduced BOS and removal of vision but able to maintain with SBA to CGA. Pt returned to bed with all needs in reach. D/c recs remain appropriate at this time. ?  ?Recommendations for follow up therapy are one component of a multi-disciplinary discharge planning process, led by the attending physician.  Recommendations may be updated based on patient status, additional functional criteria and insurance authorization. ? ?Follow Up Recommendations ? Home health PT ?  ?  ?Assistance Recommended at Discharge Intermittent Supervision/Assistance  ?Patient can return home with the following A little help with walking and/or transfers;A little help with bathing/dressing/bathroom;Assistance with cooking/housework;Assist for transportation ?  ?Equipment Recommendations ? Rolling walker (2 wheels)  ?  ?Recommendations for Other Services   ? ? ?  ?Precautions / Restrictions Precautions ?Precautions: Fall ?Restrictions ?Weight Bearing Restrictions: No  ?  ? ?Mobility ? Bed Mobility ?Overal bed mobility: Independent ?  ?  ?  ?  ?  ?  ?  ?Patient Response: Cooperative ? ?Transfers ?Overall transfer level: Needs assistance ?Equipment used: None ?Transfers: Sit to/from Stand ?Sit to Stand:  Supervision ?  ?  ?  ?  ?  ?  ?  ? ?Ambulation/Gait ?  ?  ?  ?  ?  ?  ?  ?General Gait Details: Gait deferred due to RVR ? ? ?Stairs ?  ?  ?  ?  ?  ? ? ?Wheelchair Mobility ?  ? ?Modified Rankin (Stroke Patients Only) ?  ? ? ?  ?Balance Overall balance assessment: Needs assistance ?Sitting-balance support: Bilateral upper extremity supported, Feet supported ?Sitting balance-Leahy Scale: Good ?  ?  ?Standing balance support: No upper extremity supported, During functional activity ?Standing balance-Leahy Scale: Fair ?Standing balance comment: Intermittent SUE on counter for stability but overall ale to maintain static standing without UE's once in standing ?  ?  ?  ?  ?  ?  ?  ?  ?  ?  ?  ?  ? ?  ?Cognition Arousal/Alertness: Awake/alert ?Behavior During Therapy: [facility] for tasks assessed/performed ?Overall Cognitive Status: Within Functional Limits for tasks assessed ?  ?  ?  ?  ?  ?  ?  ?  ?  ?  ?  ?  ?  ?  ?  ?  ?General Comments: Required minimal re-direction due to perseveration on still being in hospital ?  ?  ? ?  ?Exercises Other Exercises ?Other Exercises: Static balance exercises for 2x30 sec bouts each and no UE support: feet apart eyes open, feet together eyes oen, feet together eyes closed, tandem stance with each LE under BOS ? ?  ?General Comments General comments (skin integrity, edema, etc.): HR ranging from 87-148 BPM ?  ?  ? ?Pertinent Vitals/Pain Pain Assessment ?  Pain Assessment: No/denies pain  ? ? ?Home Living   ?  ?  ?  ?  ?  ?  ?  ?  ?  ?   ?  ?Prior Function    ?  ?  ?   ? ?PT Goals (current goals can now be found in the care plan section) Acute Rehab PT Goals ?Patient Stated Goal: to get stronger before going home ?PT Goal Formulation: With patient ?Time For Goal Achievement: 07/07/21 ?Potential to Achieve Goals: Fair ?Progress towards PT goals: Progressing toward goals ? ?  ?Frequency ? ? ? Min 2X/week ? ? ? ?  ?PT Plan Current plan remains appropriate  ? ? ?Co-evaluation   ?  ?  ?  ?  ? ?   ?AM-PAC PT "6 Clicks" Mobility   ?Outcome Measure ? Help needed turning from your back to your side while in a flat bed without using bedrails?: A Little ?Help needed moving from lying on your back to sitting on the side of a flat bed without using bedrails?: A Little ?Help needed moving to and from a bed to a chair (including a wheelchair)?: A Little ?Help needed standing up from a chair using your arms (e.g., wheelchair or bedside chair)?: A Little ?Help needed to walk in hospital room?: A Little ?Help needed climbing 3-5 steps with a railing? : A Lot ?6 Click Score: 17 ? ?  ?End of Session Equipment Utilized During Treatment: Oxygen;Gait belt ?Activity Tolerance: Treatment limited secondary to medical complications (Comment) ?Patient left: with call bell/phone within reach;in bed ?Nurse Communication: Mobility status ?PT Visit Diagnosis: Unsteadiness on feet (R26.81);Difficulty in walking, not elsewhere classified (R26.2);Other abnormalities of gait and mobility (R26.89);Muscle weakness (generalized) (M62.81) ?  ? ? ?Time: 6144-3154 ?PT Time Calculation (min) (ACUTE ONLY): 19 min ? ?Charges:  $Neuromuscular Re-education: 8-22 mins          ?          ? ?Salem Caster. Fairly IV, PT, DPT ?Physical Therapist- [place]  ?[facility]  ?06/28/2021, 10:37 AM ? ?

## 2021-06-28 NOTE — Consult Note (Signed)
Cardiology Consultation:   Patient ID: Cameron Foster.; [id number]; Mar 22, 1941   Admit date: 06/22/2021 Date of Consult: 06/28/2021  Primary Care Provider: [name], MD Primary Cardiologist: End Primary Electrophysiologist:  None   Patient Profile:   Cameron Foster. is a 81 y.o. male with a hx of lone Afib in 1995 not on [place], subarachnoid hemorrhage of uncertain etiology complicated by more recent stroke, HTN, HLD, prostate cancer, chronic lower extremity edema related to lymphedema, and alcohol use who is being seen today for the evaluation of Afib with RVR at the request of Dr. [place] Foster.  History of Present Illness:   Mr. Cameron Foster has a history of chronic, stable exertional angina times several years and underwent stress testing in June 2017 which showed no evidence of ischemia or infarct.  In 2020, he presented to the [facility] ED with facial pain and double vision.  He was found to have subarachnoid hemorrhage and was transferred to [facility] for further management but did not require any intervention.  In February 2021, he was admitted to [facility] with left arm weakness, numbness, and reduced grip strength and was found to have a small acute lacunar infarct.  Echo showed an EF of 55-60% without regional wall motion abnormalities, normal RV function, and trivial mitral regurgitation.  Carotid ultrasound showed moderate bilateral carotid disease.  Neurology felt the stroke was most likely embolic in origin however given prior history of subarachnoid hemorrhage, he was not placed on oral anticoagulation.  He wore a 14-day Zio monitor in 06/2019, that showed predominantly sinus rhythm at a rate of 74 with occasional PVCs and 225 episodes of PSVT lasting up to 17-1/2 seconds (maximum heart rate 187).  He did not have any sustained arrhythmias or atrial fibrillation.  He was admitted on 06/22/2021 with rectal bleeding with an initial Hgb of 10.4 (baseline 13.2) in the setting of left-sided  colitis felt to be infectious vs inflammatory. Flexible sigmoidoscopy revealed possible ischemic colitis. He has been maintained on antibiotics per IM. On 3/10, he developed Afib with RVR (confirmed to be in sinus upon admission). He received IV Cardizem 20 mg x 1 and IV metoprolol 5 mg x 2 on 3/10, and again on 3/11 followed by the initiation of a Cardizem gtt. Echo on 06/26/2021 showed an EF of 45-50%, global hypokinesis, moderate concentric LVH, normal RVSF and ventricular cavity size, normal PASP, mildly dilated left atrium measuring 4.2 cm, mild mitral regurgitation, mildly calcified aortic valve without evidence of stenosis, and mild dilatation of the aortic root and ascending aorta. On 3/12, he received 0.5 mg and 0.25 mg of IV digoxin. This morning, he received 0.125 mg of IV digoxin. Toprol has been titrated from 50 mg to 100 mg daily. Despite escalation of medical therapy, his ventricular rates have been difficult to control. CXR performed 3/12 favored chronic bronchitis without acute findings. Currently, he remains in Afib/flutter with ventricular rates in the 90s to low 100s bpm mostly with occasional episodes into the 150s bpm. He denies any further abdominal pain or BRBPR. He is upset with the fact that he remains admitted.   Labs notable for BNP 569, HGB 10.5, digoxin 1.4, high sensitivity troponin 18 with a delta troponin of 14, D-dimer 9.33    Past Medical History:  Diagnosis Date   Alcoholism ([place])    Arthritis    Atrial fibrillation ([place]) 1995   one episode   Basal cell carcinoma 04/26/2017   Right medial cheek. Superficial  and nodular   Basal cell carcinoma 06/11/2019   Left anterior shoulder. Nodular pattern   Basal cell carcinoma 09/09/2019   Right nasal ala, EDC   Basal cell carcinoma 02/05/2020   L upper eyebrow, EDC    Carotid arterial disease ([place])    a. 2019/07/10 Carotid U/S: <50% bilat ICA stenoses.   Chicken pox    Colon cancer (HCC)    Colon polyp    Depression     Son died 07/09/14   Diverticulitis    Diverticulosis 30 years   Diverticulosis    Dysrhythmia    Gastritis    GERD (gastroesophageal reflux disease)    H. pylori infection    History of cerebral hemorrhage    History of echocardiogram    a. 07/10/2019 Echo: EF 55-60%, no rwma, triv MR/AI.   History of stress test    a. 09/2015 MV: No ischemia/infarct. EF 45-54% (nl by echo).   Hyperlipidemia    Hypertension    Irritable bowel syndrome    Prostate cancer ([place]) 07/09/2010   treated with radiation therapy. Prostate   PSVT (paroxysmal supraventricular tachycardia) ([place])    a. 06/2019 Zio: Avg rate 74 (54-120), occas PACs, rare PVCs, 125 episodes of PVCs (longest 17.5 secs; max rate 187). No afib.   Squamous cell carcinoma of skin 07/18/2017   Left medial calf. KA type   Squamous cell carcinoma of skin 04/24/2018   Right above med. brow   Squamous cell carcinoma of skin 06/11/2019   Right posterior shoulder. SCCis, hypertrophic   Squamous cell carcinoma of skin 01/09/2020   Mid nasal dorsum, MOHS, Efudex x 4wks   Stroke [facility])    Vasovagal syncope     Past Surgical History:  Procedure Laterality Date   [place] no stents   CATARACT EXTRACTION, BILATERAL     COLON RESECTION SIGMOID N/A 12/07/2016   Procedure: COLON RESECTION SIGMOID;  Surgeon: Clayburn Pert, MD;  Location: [facility] ORS;  Service: General;  Laterality: N/A;   COLON SURGERY  11/2016   Colostomy   COLONOSCOPY  Jul 09, 2014   COLONOSCOPY WITH PROPOFOL N/A 05/30/2016   Procedure: COLONOSCOPY WITH PROPOFOL;  Surgeon: Lollie Sails, MD;  Location: [facility] ENDOSCOPY;  Service: Endoscopy;  Laterality: N/A;   COLOSTOMY Left 12/07/2016   Procedure: COLOSTOMY;  Surgeon: Clayburn Pert, MD;  Location: [facility] ORS;  Service: General;  Laterality: Left;   COLOSTOMY REVERSAL N/A 03/21/2017   Procedure: COLOSTOMY REVERSAL;  Surgeon: Clayburn Pert, MD;  Location: [facility] ORS;  Service: General;  Laterality: N/A;    COLOSTOMY TAKEDOWN N/A 03/21/2017   Procedure: LAPAROSCOPIC COLOSTOMY TAKEDOWN;  Surgeon: Clayburn Pert, MD;  Location: [facility] ORS;  Service: General;  Laterality: N/A;   CYSTOSCOPY WITH STENT PLACEMENT Bilateral 03/21/2017   Procedure: CYSTOSCOPY WITH LIGHTED STENT PLACEMENT;  Surgeon: Abbie Sons, MD;  Location: [facility] ORS;  Service: Urology;  Laterality: Bilateral;   ESOPHAGOGASTRODUODENOSCOPY     ESOPHAGOGASTRODUODENOSCOPY N/A 05/30/2016   Procedure: ESOPHAGOGASTRODUODENOSCOPY (EGD);  Surgeon: Lollie Sails, MD;  Location: [facility] ENDOSCOPY;  Service: Endoscopy;  Laterality: N/A;   ESOPHAGOGASTRODUODENOSCOPY N/A 01/14/2021   Procedure: ESOPHAGOGASTRODUODENOSCOPY (EGD);  Surgeon: Toledo, Benay Pike, MD;  Location: [facility] ENDOSCOPY;  Service: Gastroenterology;  Laterality: N/A;   ESOPHAGOGASTRODUODENOSCOPY (EGD) WITH PROPOFOL N/A 04/28/2021   Procedure: ESOPHAGOGASTRODUODENOSCOPY (EGD) WITH PROPOFOL;  Surgeon: Toledo, Benay Pike, MD;  Location: [facility] ENDOSCOPY;  Service: Gastroenterology;  Laterality: N/A;   EYE SURGERY     cataracts   FLEXIBLE  SIGMOIDOSCOPY N/A 03/21/2017   Procedure: FLEXIBLE SIGMOIDOSCOPY;  Surgeon: Clayburn Pert, MD;  Location: [facility] ORS;  Service: General;  Laterality: N/A;   FLEXIBLE SIGMOIDOSCOPY N/A 06/24/2021   Procedure: Beryle Quant;  Surgeon: Jonathon Bellows, MD;  Location: [facility] ENDOSCOPY;  Service: Gastroenterology;  Laterality: N/A;   FRACTURE SURGERY Bilateral    right arm and left wrist   INCISION AND DRAINAGE ABSCESS N/A 12/07/2016   Procedure: DRAINAGE  OF INTRA ABDOMINAL ABSCESS;  Surgeon: Clayburn Pert, MD;  Location: [facility] ORS;  Service: General;  Laterality: N/A;   LAPAROTOMY N/A 12/07/2016   Procedure: EXPLORATORY LAPAROTOMY;  Surgeon: Clayburn Pert, MD;  Location: [facility] ORS;  Service: General;  Laterality: N/A;   PROSTATE SURGERY     Microwave therapy   TONSILLECTOMY     TOTAL KNEE ARTHROPLASTY Right 07/27/2016   Procedure: TOTAL KNEE  ARTHROPLASTY;  Surgeon: [name], MD;  Location: [facility] ORS;  Service: Orthopedics;  Laterality: Right;  Dr. Erlene Quan had to place Urinary catheter due to prostate cancer history.  Using flexible scope.     Home Meds: Prior to Admission medications   Medication Sig Start Date End Date Taking? Authorizing Provider  amLODipine (NORVASC) 5 MG tablet TAKE 1 TABLET BY MOUTH EVERY DAY 06/21/21  Yes Bacigalupo, Dionne Bucy, MD  aspirin 81 MG EC tablet Take 81 mg by mouth daily.    Yes [provider]  atorvastatin (LIPITOR) 40 MG tablet TAKE 1 TABLET BY MOUTH EVERY DAY 09/02/20  Yes Bacigalupo, Dionne Bucy, MD  furosemide (LASIX) 20 MG tablet TAKE 1 TABLET BY MOUTH EVERY DAY 04/13/21  Yes End, Harrell Gave, MD  lisinopril (ZESTRIL) 40 MG tablet TAKE 1 TABLET BY MOUTH EVERY DAY 06/21/21  Yes Bacigalupo, Dionne Bucy, MD  meclizine (ANTIVERT) 25 MG tablet Take 25 mg by mouth daily. 04/14/21  Yes [provider]  metoprolol succinate (TOPROL-XL) 50 MG 24 hr tablet TAKE 1 TABLET BY MOUTH DAILY. TAKE WITH OR IMMEDIATELY FOLLOWING A MEAL. 03/04/21  Yes Bacigalupo, Dionne Bucy, MD  Multiple Vitamin (MULTIVITAMIN WITH MINERALS) TABS tablet Take 1 tablet by mouth daily. Patient taking differently: Take 1 tablet by mouth daily. Mens daily 50+ 10/24/18  Yes Vaughan Basta, MD  pantoprazole (PROTONIX) 40 MG tablet Take 40 mg by mouth daily.   Yes [provider]  sertraline (ZOLOFT) 50 MG tablet Take 1 tablet (50 mg total) by mouth daily. 06/08/21  Yes Mecum, Erin E, PA-C  vitamin B-12 (CYANOCOBALAMIN) 1000 MCG tablet Take 1,000 mcg by mouth daily.   Yes [provider]  docusate sodium (COLACE) 100 MG capsule Take 100 mg by mouth daily as needed for mild constipation.    [provider]  fluticasone (FLONASE) 50 MCG/ACT nasal spray Place 1 spray into both nostrils daily.    [provider]  Guaifenesin (MUCINEX MAXIMUM STRENGTH) 1200 MG TB12 Take 1 tablet by mouth daily as  needed.    [provider]  hydrocortisone 2.5 % cream APPLY TO AFFECTED AREA TWICE A DAY AS NEEDED FOR RASH 10/22/20   Bacigalupo, Dionne Bucy, MD  traMADol (ULTRAM) 50 MG tablet Take 50 mg by mouth every 6 (six) hours as needed for severe pain. Patient not taking: Reported on 06/22/2021    [provider]    Inpatient Medications: Scheduled Meds:  amoxicillin-clavulanate  1 tablet Oral Q12H   atorvastatin  40 mg Oral q1800   digoxin  0.125 mg Oral Daily   folic acid  1 mg Oral Daily  furosemide  20 mg Intravenous BID   lisinopril  40 mg Oral Daily   loratadine  10 mg Oral Daily   meclizine  25 mg Oral Daily   metoprolol succinate  100 mg Oral Q breakfast   multivitamin with minerals  1 tablet Oral Daily   multivitamin with minerals  1 tablet Oral Daily   nicotine  21 mg Transdermal Daily   pantoprazole  40 mg Oral Daily   potassium chloride  40 mEq Oral BID   senna  1 tablet Oral Daily   sertraline  50 mg Oral Daily   sodium chloride flush  3 mL Intravenous Q12H   thiamine  100 mg Oral Daily   Or   thiamine  100 mg Intravenous Daily   vitamin B-12  1,000 mcg Oral Daily   Continuous Infusions:  sodium chloride     diltiazem (CARDIZEM) infusion 5 mg/hr (06/27/21 2151)   PRN Meds: sodium chloride, acetaminophen **OR** acetaminophen, fluticasone, guaiFENesin-dextromethorphan, melatonin, morphine injection, nitroGLYCERIN, ondansetron **OR** ondansetron (ZOFRAN) IV, sodium chloride flush  Allergies:   Allergies  Allergen Reactions   Formaldehyde Rash    From shoes made with this material    Social History:   Social History   Socioeconomic History   Marital status: Married    Spouse name: [name]   Number of children: 2   Years of education: Not on file   Highest education level: Bachelor's degree (e.g., BA, AB, BS)  Occupational History   Occupation: retired Dance movement psychotherapist  Tobacco Use   Smoking status: Former    Packs/day: 1.00    Years: 27.00     Pack years: 27.00    Types: Cigarettes    Quit date: 04/18/1988    Years since quitting: 33.2   Smokeless tobacco: Current    Types: Chew  Vaping Use   Vaping Use: Never used  Substance and Sexual Activity   Alcohol use: Yes    Alcohol/week: 7.0 standard drinks    Types: 7 Glasses of wine per week    Comment: weekly-Former heavy use ETOH   Drug use: No   Sexual activity: Not Currently    Birth control/protection: None    Comment: Married  Other Topics Concern   Not on file  Social History Narrative   Not on file   Social Determinants of Health   Financial Resource Strain: Low Risk    Difficulty of Paying Living Expenses: Not hard at all  Food Insecurity: No Food Insecurity   Worried About Charity fundraiser in the Last Year: Never true   Arboriculturist in the Last Year: Never true  Transportation Needs: No Transportation Needs   Lack of Transportation (Medical): No   Lack of Transportation (Non-Medical): No  Physical Activity: Inactive   Days of Exercise per Week: 0 days   Minutes of Exercise per Session: 0 min  Stress: No Stress Concern Present   Feeling of Stress : Only a little  Social Connections: Moderately Integrated   Frequency of Communication with Friends and Family: More than three times a week   Frequency of Social Gatherings with Friends and Family: Once a week   Attends Religious Services: More than 4 times per year   Active Member of [employer] or Organizations: No   Attends Archivist Meetings: Never   Marital Status: Married  Human resources officer Violence: Not At Risk   Fear of Current or Ex-Partner: No   Emotionally Abused: No   Physically Abused:  No   Sexually Abused: No     Family History:   Family History  Problem Relation Age of Onset   Lung cancer Father        smoker   Other Mother    Sudden death Son        due to "Blood clots"   Bipolar disorder Son    Heart disease Son    Kidney disease Daughter        congenital one small  kidney   Prostate cancer Neg Hx    Bladder Cancer Neg Hx     ROS:  Review of Systems  Constitutional:  Positive for malaise/fatigue. Negative for chills, diaphoresis, fever and weight loss.  HENT:  Negative for congestion.   Eyes:  Negative for discharge and redness.  Respiratory:  Negative for cough, hemoptysis, sputum production, shortness of breath and wheezing.   Cardiovascular:  Negative for chest pain, palpitations, orthopnea, claudication, leg swelling and PND.  Gastrointestinal:  Positive for abdominal pain and blood in stool. Negative for heartburn, melena, nausea and vomiting.  Genitourinary:  Negative for hematuria.  Musculoskeletal:  Negative for falls and myalgias.  Skin:  Negative for rash.  Neurological:  Negative for dizziness, tingling, tremors, sensory change, speech change, focal weakness, loss of consciousness and weakness.  Endo/Heme/Allergies:  Does not bruise/bleed easily.  Psychiatric/Behavioral:  Negative for substance abuse. The patient is not nervous/anxious.   All other systems reviewed and are negative.    Physical Exam/Data:   Vitals:   06/27/21 2001 06/28/21 0015 06/28/21 0449 06/28/21 0729  BP: 132/78 126/85 126/85 120/81  Pulse: 85 70 79 84  Resp: 20 (!) 23 17 17   Temp: 98.7 F (37.1 C) 98.8 F (37.1 C) 98.5 F (36.9 C) 99.1 F (37.3 C)  TempSrc: Oral Oral  Oral  SpO2: 99% 98% 99% 96%  Weight:      Height:        Intake/Output Summary (Last 24 hours) at 06/28/2021 0902 Last data filed at 06/28/2021 0747 Gross per 24 hour  Intake 3 ml  Output 451 ml  Net -448 ml   Filed Weights   06/21/21 2033 06/24/21 0901  Weight: 90.7 kg 90.7 kg   Body mass index is 30.41 kg/m.   Physical Exam: General: Well developed, well nourished, in no acute distress. Head: Normocephalic, atraumatic, sclera non-icteric, no xanthomas, nares without discharge.  Neck: Negative for carotid bruits. JVD not elevated. Lungs: Faint crackles along the bilateral  bases. Breathing is unlabored. Heart: Irregularly irregular with S1 S2. No murmurs, rubs, or gallops appreciated. Abdomen: Soft, non-tender, non-distended with normoactive bowel sounds. No hepatomegaly. No rebound/guarding. No obvious abdominal masses. Msk:  Strength and tone appear normal for age. Extremities: No clubbing or cyanosis. No edema. Distal pedal pulses are 2+ and equal bilaterally. Neuro: Alert and oriented X 3. No facial asymmetry. No focal deficit. Moves all extremities spontaneously. Psych:  Responds to questions appropriately with a normal affect.   EKG:  The EKG was personally reviewed and demonstrates: 06/22/2021 - NSR, 88 bpm, 1st degree AV block, LVH, RBBB. 06/25/2021 - Afib with RVR, 133 bpm, RBBB. 06/27/2021 - Afib/flutter, 94 bpm, RBBB Telemetry:  Telemetry was personally reviewed and demonstrates: Afib/flutter with ventricular rates in the 90 to low 100s bpm for the most part with short runs into the 150s bpm  Weights: [facility] Weights   06/21/21 2033 06/24/21 0901  Weight: 90.7 kg 90.7 kg    Relevant CV Studies:  2D echo 06/26/2021: 1.  Left ventricular ejection fraction, by estimation, is 45 to 50%. The  left ventricle has mildly decreased function. The left ventricle  demonstrates global hypokinesis. There is moderate concentric left  ventricular hypertrophy. Left ventricular  diastolic parameters are indeterminate. Elevated left ventricular  end-diastolic pressure.   2. Right ventricular systolic function is normal. The right ventricular  size is normal. There is normal pulmonary artery systolic pressure.   3. Left atrial size was mildly dilated.   4. The mitral valve is normal in structure. Mild mitral valve  regurgitation. No evidence of mitral stenosis.   5. The aortic valve is tricuspid. There is mild calcification of the  aortic valve. Aortic valve regurgitation is not visualized. No aortic  stenosis is present.   6. Aortic dilatation noted. There is mild  dilatation of the aortic root,  measuring 41 mm. There is mild dilatation of the ascending aorta,  measuring 39 mm.   7. The inferior vena cava is dilated in size with <50% respiratory  variability, suggesting right atrial pressure of 15 mmHg. __________  Elwyn Reach patch 06/2019: The patient was monitored for 13 days, 19 hours. The predominant rhythm was sinus with an average rate of 74 bpm (range 54 to 120 bpm in sinus). Occasional PACs (~4% burden) and rare PVCs were noted. There were 125 episodes of paroxysmal supraventricular tachycardia lasting up to 17.5 seconds with a maximum rate of 187 bpm. No sustained arrhythmia or prolonged pause was identified. There were no patient triggered events.   Predominantly sinus rhythm with occasional PACs and rare PVCs.  Multiple episodes of PSVT noted, lasting up to 17.5 seconds. __________  2D echo 06/09/2019: 1. Left ventricular ejection fraction, by estimation, is 55 to 60%. The  left ventricle has normal function. The left ventricle has no regional  wall motion abnormalities. Left ventricular diastolic parameters were  normal.   2. Right ventricular systolic function is normal. The right ventricular  size is normal.   3. The mitral valve is normal in structure and function. Trivial mitral  valve regurgitation.   4. The aortic valve is normal in structure and function. Aortic valve  regurgitation is not visualized. __________  2D echo 03/29/2018: - Left ventricle: The cavity size was normal. Systolic function was    normal. The estimated ejection fraction was in the range of 60%    to 65%. Wall motion was normal; there were no regional wall    motion abnormalities. Left ventricular diastolic function    parameters were normal.  - Aortic valve: There was mild regurgitation.  - Left atrium: The atrium was mildly dilated.  - Right ventricle: Systolic function was normal.  - Atrial septum: Suspected small patent foramen ovale by color flow     Doppler. .  - Pulmonary arteries: Systolic pressure was within the normal    range. __________  2D echo 10/05/2015: - Left ventricle: The cavity size was mildly dilated. There was    mild concentric hypertrophy. Systolic function was normal. The    estimated ejection fraction was in the range of 50% to 55%. Wall    motion was normal; there were no regional wall motion    abnormalities. Left ventricular diastolic function parameters    were normal.  - Aortic valve: There was trivial regurgitation.  - Mitral valve: There was mild regurgitation.  - Left atrium: The atrium was mildly dilated. __________  Carlton Adam MPI 10/02/2015: There was no ST segment deviation noted during stress. No T wave inversion was noted  during stress. The study is normal. This is a low risk study. The left ventricular ejection fraction is mildly decreased (45-54%). EF appears close to normal visually. recommend an echocardiogram   Laboratory Data:  Chemistry Recent Labs  Lab 06/26/21 0416 06/27/21 0411 06/28/21 0426  NA 136 136 136  K 3.1* 3.6 3.9  CL 103 102 102  CO2 27 26 29   GLUCOSE 114* 125* 128*  BUN 7* 7* 6*  CREATININE 1.15 1.13 1.12  CALCIUM 8.4* 8.6* 8.8*  GFRNONAA >60 >60 >60  ANIONGAP 6 8 5     Recent Labs  Lab 06/21/21 2035  PROT 7.6  ALBUMIN 3.9  AST 26  ALT 13  ALKPHOS 79  BILITOT 0.7   Hematology Recent Labs  Lab 06/26/21 0416 06/27/21 0411 06/28/21 0426  WBC 13.4* 11.5* 10.4  RBC 3.72* 3.70* 3.66*  HGB 10.8* 10.7* 10.5*  HCT 33.7* 33.7* 33.3*  MCV 90.6 91.1 91.0  MCH 29.0 28.9 28.7  MCHC 32.0 31.8 31.5  RDW 12.9 12.9 12.7  PLT 246 258 253   Cardiac EnzymesNo results for input(s): TROPONINI in the last 168 hours. No results for input(s): TROPIPOC in the last 168 hours.  BNP Recent Labs  Lab 06/25/21 1706 06/26/21 1835 06/28/21 0426  BNP 498.6* 662.7* 569.3*    DDimer  Recent Labs  Lab 06/27/21 0652  DDIMER 9.33*    Radiology/Studies:  DG Chest  Port 1 View  Result Date: 06/27/2021 IMPRESSION: 1. Low lung volumes with diffuse peribronchial cuffing, which appears to be chronic compared to the prior study, favored to reflect a chronic bronchitis. No definite acute findings are noted. 2. Aortic atherosclerosis. Electronically Signed   By: Vinnie Langton M.D.   On: 06/27/2021 06:58   DG Chest Port 1 View  Result Date: 06/26/2021 IMPRESSION: No active disease. Electronically Signed   By: Lucienne Capers M.D.   On: 06/26/2021 19:09   Assessment and Plan:   1. Afib/flutter: -He remains in Afib/flutter with ventricular rates in the 90s to low 100s for the most part, though will have episodes into the 150s bpm with short atrial runs -Historically, he has not been maintained on [place] as an outpatient given lone episode in 1995 without documented recurrence and in the context of unprovoked SAH -The idea of [place] is not straightforward given his history of SAH, will need to discuss anticoagulation with MD, may need neurology input as well -May need to consider LAA occluder device as an outpatient -CHADS2VASc 7 (CHF, HTN, age x 2, CVA x 2, vascular disease) -Digoxin level 1.4 this morning, no indication for Digibind at this time -Has already received digoxin 0.125 mg this morning, hold further doses -Transition Toprol XL 100 mg to Lopressor 25 mg q 6 hours for now, for added ventricular rate control/titration -Wean off Cardizem gtt over the next 24 hours as possible -Not currently a candidate for TEE/DCCV given inability to use [place] -Will need to pursue rate control at this time  2. Acute HFrEF: -Possibly tachy-mediated in the setting of Afib with RVR -Continue gentle IV diuresis  -Lisinopril and metoprolol  -Escalate GDMT moving forward -Would look to repeat a limited echo in a couple of months, once ventricular rate has been controlled to reassess LVSF, with consideration for ischemic evaluation at that time  3. Hematochezia with acute  blood loss anemia/possible ischemic colitis: -HGB low, though stable -Management per IM/GI  4. History of unprovoked SAH with subsequent stroke: -Evaluated by neurology with stroke felt to  be embolic, though [place] was deferred given prior history of SAH  5. HTN: -Blood pressure well controlled  -He remains on lisinopril, Lasix, and Toprol   6. Hypokalemia: -Noted to be 3.1 on 3/11, improved to 3.9 this morning    For questions or updates, please contact [place] Please consult [email or web address] for contact info under Cardiology/STEMI.   Signed, Christell Faith, PA-C [place] Pager: [phone] 06/28/2021, 9:02 AM

## 2021-06-28 NOTE — Evaluation (Signed)
Occupational Therapy Re-Evaluation ?Patient Details ?Name: Cameron Foster. ?MRN: [id number] ?DOB: 03/09/1941 ?Today's Date: 06/28/2021 ? ? ?History of Present Illness Dru Primeau. is a 81 y.o. male with medical history of chronic alcohol use, paroxysmal A-fib not on anticoagulation, CVA, hypertension, history of upper GI bleed secondary to pyloric ulcer who presents with bright red blood per rectum.  ? ?Clinical Impression ?  ?Mr [employer] was seen for OT re-evaluation following transfer to PCU. Upon arrival to room pt reclined in bed, family at bedside, requesting to complete grooming tasks. Pt requires CGA + single UE support standing toothbruhsing/shaving, cues to transition to sitting to complete grooming 2/2 increased HR. Max HR 154 in sitting and standing, RN notified. Functional balance task reaching outside BOS to open knee hiehgt cabinets with single UE support. Pt making good progress toward goals, updated this session. Pt continues to benefit from skilled OT services to maximize return to PLOF and minimize risk of future falls, injury, caregiver burden, and readmission. Will continue to follow POC. Discharge recommendation remains appropriate.  ?   ? ?Recommendations for follow up therapy are one component of a multi-disciplinary discharge planning process, led by the attending physician.  Recommendations may be updated based on patient status, additional functional criteria and insurance authorization.  ? ?Follow Up Recommendations ? Home health OT  ?  ?Assistance Recommended at Discharge Intermittent Supervision/Assistance  ?Patient can return home with the following Assistance with cooking/housework;Help with stairs or ramp for entrance;Assist for transportation;A little help with walking and/or transfers;A little help with bathing/dressing/bathroom ? ?  ?Functional Status Assessment ?    ?Equipment Recommendations ? None recommended by OT  ?  ?Recommendations for Other Services   ? ? ?  ?Precautions  / Restrictions Precautions ?Precautions: Fall ?Restrictions ?Weight Bearing Restrictions: No  ? ?  ? ?Mobility Bed Mobility ?Overal bed mobility: Independent ?  ?  ?  ?  ?  ?  ?  ?  ? ?Transfers ?Overall transfer level: Needs assistance ?Equipment used: None ?Transfers: Sit to/from Stand ?Sit to Stand: Min guard ?  ?  ?  ?  ?  ?General transfer comment: reaches for counter to assist ?  ? ?  ?Balance Overall balance assessment: Needs assistance ?Sitting-balance support: Bilateral upper extremity supported, Feet supported ?Sitting balance-Leahy Scale: Good ?  ?  ?Standing balance support: During functional activity, Single extremity supported ?Standing balance-Leahy Scale: Fair ?  ?  ?  ?  ?  ?  ?  ?  ?  ?  ?  ?  ?   ? ?ADL either performed or assessed with clinical judgement  ? ?ADL Overall ADL's : Needs assistance/impaired ?  ?  ?  ?  ?  ?  ?  ?  ?  ?  ?  ?  ?  ?  ?  ?  ?  ?  ?  ?General ADL Comments: CGA + single UE support standing toothbruhsing/shaving, cues to transition to sitting to complete ADLs 2/2 increased HR. Functional balance task reaching outside BOS to open knee hiehgt cabinets with single UE support.  ? ? ? ? ?Pertinent Vitals/Pain Pain Assessment ?Pain Assessment: No/denies pain  ? ? ? ?Hand Dominance   ?  ?Extremity/Trunk Assessment Upper Extremity Assessment ?Upper Extremity Assessment: Overall WFL for tasks assessed ?  ?Lower Extremity Assessment ?Lower Extremity Assessment: Generalized weakness ?  ?  ?  ?Communication   ?  ?Cognition Arousal/Alertness: Awake/alert ?Behavior During Therapy: [facility] for tasks assessed/performed ?  Overall Cognitive Status: Within Functional Limits for tasks assessed ?  ?  ?  ?  ?  ?  ?  ?  ?  ?  ?  ?  ?  ?  ?  ?  ?  ?  ?  ?General Comments  highest HR 154 in sitting and standing ? ?  ? ? ?OT Goals(Current goals can be found in the care plan section) Acute Rehab OT Goals ?Patient Stated Goal: to go home ?OT Goal Formulation: With patient/family ?Time For Goal  Achievement: 07/12/21 ?Potential to Achieve Goals: Good ?ADL Goals ?Pt Will Perform Grooming: with modified independence;standing ?Pt Will Perform Lower Body Dressing: with supervision;sit to/from stand ?Pt Will Transfer to Toilet: with modified independence;ambulating;regular height toilet ?Pt Will Perform Toileting - Clothing Manipulation and hygiene: with modified independence;sit to/from stand  ?OT Frequency: Min 2X/week ?  ? ?Co-evaluation   ?  ?  ?  ?  ? ?  ?AM-PAC OT "6 Clicks" Daily Activity     ?Outcome Measure Help from another person eating meals?: None ?Help from another person taking care of personal grooming?: None ?Help from another person toileting, which includes using toliet, bedpan, or urinal?: A Little ?Help from another person bathing (including washing, rinsing, drying)?: A Little ?Help from another person to put on and taking off regular upper body clothing?: None ?Help from another person to put on and taking off regular lower body clothing?: A Little ?6 Click Score: 21 ?  ?End of Session   ? ?Activity Tolerance: Patient tolerated treatment well ?Patient left: in bed;with call bell/phone within reach;with family/visitor present ? ?OT Visit Diagnosis: Unsteadiness on feet (R26.81);Muscle weakness (generalized) (M62.81)  ?              ?Time: 7591-6384 ?OT Time Calculation (min): 31 min ?Charges:  OT General Charges ?$OT Visit: 1 Visit ?OT Evaluation ?$OT Re-eval: 1 Re-eval ?OT Treatments ?$Self Care/Home Management : 23-37 mins ? ?Dessie Coma, M.S. OTR/L  ?06/28/21, 3:48 PM  ?ascom [phone] ? ?

## 2021-06-28 NOTE — Care Management Important Message (Signed)
Important Message ? ?Patient Details  ?Name: Cameron Foster. ?MRN: [id number] ?Date of Birth: Aug 16, 1940 ? ? ?Medicare Important Message Given:  Yes ? ? ? ? ?Dannette Barbara ?06/28/2021, 11:55 AM ?

## 2021-06-28 NOTE — Progress Notes (Signed)
[facility] Progress Note ? ?Patient: Cameron Foster.    EXH:[id number]  DOA: 06/22/2021    ?Date of Service: the patient was seen and examined on 06/28/2021 ? ?Brief hospital course: ?81 year old male past medical history of alcoholism, atrial fibrillation previous GI bleed from pyloric ulcer as well as diverticulosis presented to the emergency room on 3/7 with complaints of rectal bleeding/bright red blood per rectum and hemoglobin noted to be 10.4 (baseline of 13.2).  Admitted to the hospitalist service & GI consulted. CT angiogram noted left-sided colitis felt to be infectious versus inflammatory. ? ?Hemoglobin remained stable and actually improved to 11.1 x 3/10.  However, on early morning of 3/10, patient went into new onset rapid atrial fibrillation.  Heart rate improved although still not fully controlled.  Remaining in atrial fibrillation.  Echocardiogram done noted mild decrease ejection fraction of 45-50%.  Patient slightly volume overloaded and received IV Lasix.  On evening of 3/11, started going into persistent rapid atrial fibrillation, so patient started on Cardizem drip. ? ?Patient had some chest discomfort on the early morning of 3/12.  Troponin unremarkable.  Chest pain resolved on its own without morphine or nitroglycerin.  EKG noted atrial fibrillation with right bundle branch block and left anterior fascicular block.  Both branch blocks are unchanged from previous EKGs prior to hospitalization. ? ?Assessment and Plan: ?Assessment and Plan: ?* Left sided colitis ([place]) ?Ischemic versus infectious.  Given persistently elevated [place] blood cell count, started on antibiotics on 3/8.  Hemoglobin remained stable.  Flexible sigmoidoscopy notes possible ischemic colitis.  Doing well, Meine count almost normalized. ? ?Paroxysmal atrial fibrillation ([place]) ?Patient states he had atrial fibrillation once before, back in the 90s.  Echocardiogram done in 2021 noted no signs of atrial enlargement at that  time.  When patient was admitted during this hospitalization, was in normal sinus rhythm.  Now staying in atrial fibrillation. Echocardiogram notes mild systolic heart failure and mildly dilated atria which are new compared to echocardiogram from 2 years ago.  Not a good candidate for anticoagulation, at least not at this time given recent bleed.  Have titrated up his Toprol-XL to 100 from 50 starting 3/11 morning.  Started on Cardizem drip 3/11 evening when he went into rapid atrial fibrillation.  Despite bolus and infusion at 10, still continued to have resting rapid ventricular rate so loaded with digoxin starting 3/12 morning.  Patient at times even with mild exertion will start to have significantly elevated heart rate in the 120s.  Have consulted cardiology. ? ?Acute clinical systolic heart failure ([place]) ?Noted on echocardiogram.  Ejection fraction of 45 to 50%.  Mild decreased in systolic function when compared with echocardiogram 2 years ago.  Likely exacerbated by volume resuscitation following bleed.  Mild.  Received IV Lasix x2.  Now appears to be euvolemic. ? ?Hematochezia ?Most likely related to bleeding hemorrhoids ?Will consult GI for further evaluation ?Check serial H&H and transfuse as needed ? ?Acute blood loss anemia ?Felt to be secondary to left-sided colitis.  Infectious favored over inflammatory.  Continue to monitor closely.  Cultures unremarkable.  Hemoglobin remained stable. ? ?Essential hypertension ?Blood pressure is stable, slightly softer with atrial fibrillation when rapid. ?Continue lisinopril and have increased metoprolol ? ?Depression, major, single episode, mild ([place]) ?Continue Zoloft. ? ?Alcohol use disorder, moderate, dependence ([place]) ?Patient with a history of alcohol use disorder ?On CIWA protocol and administer lorazepam for CIWA score of 8 or greater ?Patient on MVI, thiamine and folic acid ? ?  Obesity ?Meets criteria with BMI greater than 30 ? ?Hyperlipidemia LDL goal  <70 ?Continue statin ? ?Constipation ?Related to opioid use for pain control ?We will start patient on MiraLAX 17 g daily as well as senna 1 tablet daily.  Finally starting to have a bowel movement. ? ?Osteoarthritis of hip ?Patient had plans for arthroplasty on Monday, 3/13.  Discussed with patient.  Given now new onset atrial fibrillation, would favor postponing surgery for a few weeks. ? ? ? ? ?Body mass index is 30.41 kg/m?.  ?  ?   ? ?Consultants: ?Gastroenterology ?Cardiology ? ?Procedures: ?flexible sigmoidoscopy 3/9 ?Echocardiogram: Ejection fraction of 45 to 50%.  Mildly dilated left atria ? ?Antimicrobials: ?IV cefepime 3/8-present ? ?Code Status: Full code ? ? ?Subjective: Patient okay, little frustrated that he still has to be in the hospital ? ?Objective: ?Vital signs were reviewed and unremarkable. ?Vitals:  ? 06/28/21 1104 06/28/21 1106  ?BP:  134/75  ?Pulse: 77 97  ?Resp: 18 16  ?Temp: 98.4 ?F (36.9 ?C) 98.4 ?F (36.9 ?C)  ?SpO2:  96%  ? ? ?Intake/Output Summary (Last 24 hours) at 06/28/2021 1447 ?Last data filed at 06/28/2021 1249 ?Gross per 24 hour  ?Intake 3 ml  ?Output 1301 ml  ?Net -1298 ml  ? ? ?Filed Weights  ? 06/21/21 2033 06/24/21 0901  ?Weight: 90.7 kg 90.7 kg  ? ?Body mass index is 30.41 kg/m?. ? ?Exam: ? ?General: Alert and oriented x3, no acute distress ?HEENT: Normocephalic and atraumatic, mucous membranes slightly dry ?Cardiovascular: Irregular rhythm, borderline tachycardia ?Respiratory: Clear to auscultation bilaterally ?Abdomen: Soft, nontender, nondistended, positive bowel sounds ?Musculoskeletal: No clubbing or cyanosis, 1+ pitting edema bilaterally ?Skin: No skin breaks, tears or lesions ?Psychiatry: Appropriate, no evidence of psychoses ?Neurology: No focal deficits ? ?Data Reviewed: ?Noted slight increase in Schliep count of 13.  Hemoglobin stable. ? ?Disposition:  ?Status is: Inpatient ?Remains inpatient appropriate because: Further control of his heart rate and see if he needs  any additional testing. ? ?Family Communication: Updated sister by phone. ?DVT Prophylaxis: ?SCDs Start: 06/22/21 1025 ? ? ? ?Author: ?Annita Brod ,MD ?06/28/2021 2:47 PM ? ?To reach On-call, see care teams to locate the attending and reach out via [email or web address]. ?Between 7PM-7AM, please contact night-coverage ?If you still have difficulty reaching the attending provider, please page the [facility] (Director on Call) for [facility] on amion for assistance. ? ?

## 2021-06-29 DIAGNOSIS — D62 Acute posthemorrhagic anemia: Secondary | ICD-10-CM | POA: Diagnosis not present

## 2021-06-29 DIAGNOSIS — I480 Paroxysmal atrial fibrillation: Secondary | ICD-10-CM | POA: Diagnosis not present

## 2021-06-29 DIAGNOSIS — I4891 Unspecified atrial fibrillation: Secondary | ICD-10-CM | POA: Diagnosis not present

## 2021-06-29 DIAGNOSIS — I5021 Acute systolic (congestive) heart failure: Secondary | ICD-10-CM | POA: Diagnosis not present

## 2021-06-29 DIAGNOSIS — K51511 Left sided colitis with rectal bleeding: Secondary | ICD-10-CM | POA: Diagnosis not present

## 2021-06-29 MED ORDER — FUROSEMIDE 40 MG PO TABS
40.0000 mg | ORAL_TABLET | Freq: Every day | ORAL | Status: DC
  Administered 2021-06-30: 40 mg via ORAL
  Filled 2021-06-29: qty 1

## 2021-06-29 MED ORDER — METOPROLOL TARTRATE 25 MG PO TABS
37.5000 mg | ORAL_TABLET | Freq: Four times a day (QID) | ORAL | Status: DC
  Administered 2021-06-29 – 2021-06-30 (×5): 37.5 mg via ORAL
  Filled 2021-06-29 (×5): qty 2

## 2021-06-29 MED ORDER — GUAIFENESIN ER 600 MG PO TB12
600.0000 mg | ORAL_TABLET | Freq: Two times a day (BID) | ORAL | Status: DC
  Administered 2021-06-29 – 2021-07-03 (×9): 600 mg via ORAL
  Filled 2021-06-29 (×9): qty 1

## 2021-06-29 NOTE — Progress Notes (Signed)
Mobility Specialist - Progress Note ? ? 06/29/21 1447  ?Mobility  ?Activity Refused mobility  ? ? ? ?2nd attempt. Pt declined d/t recent ambulation with PT.  ? ? ?Kathee Delton ?Mobility Specialist ?06/29/21, 2:49 PM ? ? ? ? ?

## 2021-06-29 NOTE — Progress Notes (Addendum)
[facility] Progress Note ? ?Patient: Cameron Foster.    BOF:[id number]  DOA: 06/22/2021    ?Date of Service: the patient was seen and examined on 06/29/2021 ? ?Brief hospital course: ?81 year old male past medical history of alcoholism, atrial fibrillation previous GI bleed from pyloric ulcer as well as diverticulosis presented to the emergency room on 3/7 with complaints of rectal bleeding/bright red blood per rectum and hemoglobin noted to be 10.4 (baseline of 13.2).  Admitted to the hospitalist service & GI consulted. CT angiogram noted left-sided colitis felt to be infectious versus inflammatory. ? ?Hemoglobin remained stable and actually improved to 11.1 x 3/10.  However, on early morning of 3/10, patient went into new onset rapid atrial fibrillation.  Heart rate improved although still not fully controlled.  Remaining in atrial fibrillation.  Echocardiogram done noted mild decrease ejection fraction of 45-50%.  Patient slightly volume overloaded and received IV Lasix.  On evening of 3/11, started going into persistent rapid atrial fibrillation, so patient started on Cardizem drip. ? ?Patient had some chest discomfort on the early morning of 3/12.  Troponin unremarkable.  Chest pain resolved on its own without morphine or nitroglycerin.  EKG noted atrial fibrillation with right bundle branch block and left anterior fascicular block.  Both branch blocks are unchanged from previous EKGs prior to hospitalization. ? ?Patient heart rate remains somewhat difficult to control which becomes normal at rest, but even with mild exertion starts to jump up above 100.  Cardiology consulted and following.  After discussion with cardiology and with patient, looking to start anticoagulation. ? ?Assessment and Plan: ?Assessment and Plan: ?* Left sided colitis ([place]) ?Ischemic versus infectious.  Given persistently elevated [place] blood cell count, started on antibiotics on 3/8.  Hemoglobin remained stable.  Flexible  sigmoidoscopy notes possible ischemic colitis.  Knudsen count normalized.  Completed antibiotics 3/14. ? ?Paroxysmal atrial fibrillation ([place]) ?Patient states he had atrial fibrillation once before, back in the 90s.  Echocardiogram done in 2021 noted no signs of atrial enlargement at that time.  When patient was admitted during this hospitalization, was in normal sinus rhythm.  Now staying in atrial fibrillation. Echocardiogram notes mild systolic heart failure and mildly dilated atria which are new compared to echocardiogram from 2 years ago. ? ? Have titrated up his Toprol-XL to 100 from 50 starting 3/11 morning.  Started on Cardizem drip 3/11 evening when he went into rapid atrial fibrillation.  Despite bolus and infusion at 10, still continued to have resting rapid ventricular rate so loaded with digoxin starting 3/12 morning.  Patient at times even with mild exertion will start to have significantly elevated heart rate in the 120s.  Have consulted cardiology. ? ?After discussion with cardiology and with patient, I think he needs to be a candidate for anticoagulation.  Patient did have a subarachnoid bleed with a hunt-hess score of 2 in June 2020.  He has had a bleeding ulcer in the somewhat recent past, but this hospitalization, had some rectal bleeding only from colitis.  That said, he is a very high risk for thromboembolism from this atrial fibrillation.  In review of some literature, for high risk candidates, they recommend patient can start back on anticoagulation within several weeks, so given that has been 3-1/2 years since his hemorrhage, would feel that benefit outweighs risk to start anticoagulation. ? ?Acute clinical systolic heart failure ([place]) ?Noted on echocardiogram.  Ejection fraction of 45 to 50%.  Mild decreased in systolic function when compared with echocardiogram  2 years ago.  Likely exacerbated by volume resuscitation following bleed.  Mild.  Received IV Lasix x2.  Now appears to be  euvolemic. ? ?Acute blood loss anemia ?Felt to be secondary to left-sided colitis.  Infectious favored over inflammatory.  Continue to monitor closely.  Cultures unremarkable.  Hemoglobin remained stable. ? ?Essential hypertension ?Blood pressure is stable, slightly softer with atrial fibrillation when rapid. ?Continue lisinopril and have increased metoprolol ? ?Depression, major, single episode, mild ([place]) ?Continue Zoloft. ? ?Alcohol use disorder, moderate, dependence ([place]) ?Patient with a history of alcohol use disorder ?On CIWA protocol and administer lorazepam for CIWA score of 8 or greater ?Patient on MVI, thiamine and folic acid ? ?Obesity ?Meets criteria with BMI greater than 30 ? ?Hyperlipidemia LDL goal <70 ?Continue statin ? ?Constipation ?Related to opioid use for pain control ?We will start patient on MiraLAX 17 g daily as well as senna 1 tablet daily.   ?Now having good bowel movements and MiraLAX discontinued . ? ?Osteoarthritis of hip ?Patient had plans for arthroplasty on Monday, 3/13.  Discussed with patient.  Given now new onset atrial fibrillation, would favor postponing surgery for a few weeks. ? ? ? ? ?Body mass index is 30.41 kg/m?.  ?  ?   ? ?Consultants: ?Gastroenterology ?Cardiology ? ?Procedures: ?flexible sigmoidoscopy 3/9 ?Echocardiogram: Ejection fraction of 45 to 50%.  Mildly dilated left atria ? ?Antimicrobials: ?IV Zosyn 3/7 - 3/9 ?Augmentin 3/9 - 3/14 ? ?Code Status: Full code ? ? ?Subjective: Patient okay, hoping to be discharged soon. ? ? ?Objective: ?Heart rate in atrial fibrillation rate controlled, but even with mild exertion, jumps up above 100. ?Vitals:  ? 06/29/21 0338 06/29/21 0800  ?BP: 131/79 128/75  ?Pulse: (!) 107 98  ?Resp:  18  ?Temp: 98.4 ?F (36.9 ?C) 98.2 ?F (36.8 ?C)  ?SpO2:  96%  ? ? ?Intake/Output Summary (Last 24 hours) at 06/29/2021 1537 ?Last data filed at 06/29/2021 1300 ?Gross per 24 hour  ?Intake 791.18 ml  ?Output 1200 ml  ?Net -408.82 ml  ? ?Filed Weights   ? 06/21/21 2033 06/24/21 0901  ?Weight: 90.7 kg 90.7 kg  ? ?Body mass index is 30.41 kg/m?. ? ?Exam: ? ?General: Alert and oriented x3, no acute distress ?HEENT: Normocephalic and atraumatic, mucous membranes slightly dry ?Cardiovascular: Irregular rhythm, rate controlled ?Respiratory: Clear to auscultation bilaterally ?Abdomen: Soft, nontender, nondistended, positive bowel sounds ?Musculoskeletal: No clubbing or cyanosis, 1+ pitting edema bilaterally ?Skin: No skin breaks, tears or lesions ?Psychiatry: Appropriate, no evidence of psychoses ?Neurology: No focal deficits ? ?Labs reviewed: ?Normalization of [place] blood cell count.  Hemoglobin stable. ? ?Disposition:  ?Status is: Inpatient ?Remains inpatient appropriate because: Further control of his heart rate and determination about anticoagulation ? ?Family Communication: Sister and brother-in-law at the bedside ?DVT Prophylaxis: ?SCDs Start: 06/22/21 1025 ? ? ? ?Author: ?Annita Brod ,MD ?06/29/2021 3:37 PM ? ?To reach On-call, see care teams to locate the attending and reach out via [email or web address]. ?Between 7PM-7AM, please contact night-coverage ?If you still have difficulty reaching the attending provider, please page the [facility] (Director on Call) for [facility] on amion for assistance. ? ?

## 2021-06-29 NOTE — Progress Notes (Signed)
Physical Therapy Treatment ?Patient Details ?Name: Cameron Foster. ?MRN: [id number] ?DOB: 21-Aug-1940 ?Today's Date: 06/29/2021 ? ? ?History of Present Illness Ashok Sawaya. is a 81 y.o. male with medical history of chronic alcohol use, paroxysmal A-fib not on anticoagulation, CVA, hypertension, history of upper GI bleed secondary to pyloric ulcer who presents with bright red blood per rectum. ? ?  ?PT Comments  ? ? Pt received in bed agreeable to PT services. Per Rn and NT pt's HR has been improved from yesterday. Pt tolerating supine level LE exercises well maintaining HR in mid to upper 80's BPM prior to OOB mobility. Pt indep with transferring to EoB with minguard to stand with reaching for counter top.  Pt assisting in equipment management pushing IV pole first to restroom being independent with toileting then ambulating ~ 27' with close monitoring of HR throughout. Pt requiring frequent VC's and TC's on torso to improve upright posture to prevent anterior trunk lean and for improved gait mechanics to prevent imbalance/risk for falls. Max HR post mobility up to 117-119 BPM which returns to low 80's BPM with seated rest at EOB. Pt returning to supine in bed with all needs in reach. Pt educated on gradual increase in exercise/activity with close monitoring of HR as pt does seem weaker and slightly more unsteady due to inability to safely ambulate due to a-fib with RVR. Pt does however remain safe for d/c home at this time with use of RW and with [facility] PT services.  ?  ?Recommendations for follow up therapy are one component of a multi-disciplinary discharge planning process, led by the attending physician.  Recommendations may be updated based on patient status, additional functional criteria and insurance authorization. ? ?Follow Up Recommendations ? Home health PT ?  ?  ?Assistance Recommended at Discharge Intermittent Supervision/Assistance  ?Patient can return home with the following A little help with  walking and/or transfers;A little help with bathing/dressing/bathroom;Assistance with cooking/housework;Assist for transportation ?  ?Equipment Recommendations ? Rolling walker (2 wheels)  ?  ?Recommendations for Other Services   ? ? ?  ?Precautions / Restrictions Precautions ?Precautions: Fall ?Restrictions ?Weight Bearing Restrictions: No  ?  ? ?Mobility ? Bed Mobility ?Overal bed mobility: Independent ?  ?  ?  ?Supine to sit: Independent ?Sit to supine: Independent ?  ?General bed mobility comments: Increased time ?Patient Response: Cooperative ? ?Transfers ?Overall transfer level: Needs assistance ?Equipment used: None ?Transfers: Sit to/from Stand ?Sit to Stand: Min guard ?  ?  ?  ?  ?  ?General transfer comment: reaches for counter to assist ?  ? ?Ambulation/Gait ?Ambulation/Gait assistance: Min guard ?Gait Distance (Feet): 88 Feet ?Assistive device: IV Pole ?Gait Pattern/deviations: Step-through pattern, Decreased step length - left, Decreased step length - right, Trunk flexed ?  ?  ?  ?General Gait Details: Requiring mod VC's to improve upright posture and keep IV pole closer to BOS to prevent anterior trunk lean ? ? ?Stairs ?  ?  ?  ?  ?  ? ? ?Wheelchair Mobility ?  ? ?Modified Rankin (Stroke Patients Only) ?  ? ? ?  ?Balance Overall balance assessment: Needs assistance ?Sitting-balance support: Bilateral upper extremity supported, Feet supported ?Sitting balance-Leahy Scale: Good ?  ?  ?Standing balance support: During functional activity, Single extremity supported ?Standing balance-Leahy Scale: Fair ?  ?  ?  ?  ?  ?  ?  ?  ?  ?  ?  ?  ?  ? ?  ?  Cognition Arousal/Alertness: Awake/alert ?Behavior During Therapy: [facility] for tasks assessed/performed ?Overall Cognitive Status: Within Functional Limits for tasks assessed ?  ?  ?  ?  ?  ?  ?  ?  ?  ?  ?  ?  ?  ?  ?  ?  ?  ?  ?  ? ?  ?Exercises General Exercises - Lower Extremity ?Ankle Circles/Pumps: AROM, Strengthening, Both, 20 reps, Supine ?Hip  ABduction/ADduction: AROM, Strengthening, Supine, Both, 20 reps ?Straight Leg Raises: AROM, Supine, Strengthening, Both, 10 reps ? ?  ?General Comments General comments (skin integrity, edema, etc.): Max HR up to 117-119 BPM post mobility ?  ?  ? ?Pertinent Vitals/Pain Pain Assessment ?Pain Assessment: No/denies pain  ? ? ?Home Living   ?  ?  ?  ?  ?  ?  ?  ?  ?  ?   ?  ?Prior Function    ?  ?  ?   ? ?PT Goals (current goals can now be found in the care plan section) Acute Rehab PT Goals ?Patient Stated Goal: to get stronger before going home ?PT Goal Formulation: With patient ?Time For Goal Achievement: 07/07/21 ?Potential to Achieve Goals: Fair ?Progress towards PT goals: Progressing toward goals ? ?  ?Frequency ? ? ? Min 2X/week ? ? ? ?  ?PT Plan Current plan remains appropriate  ? ? ?Co-evaluation   ?  ?  ?  ?  ? ?  ?AM-PAC PT "6 Clicks" Mobility   ?Outcome Measure ? Help needed turning from your back to your side while in a flat bed without using bedrails?: None ?Help needed moving from lying on your back to sitting on the side of a flat bed without using bedrails?: None ?Help needed moving to and from a bed to a chair (including a wheelchair)?: A Little ?Help needed standing up from a chair using your arms (e.g., wheelchair or bedside chair)?: A Little ?Help needed to walk in hospital room?: A Little ?Help needed climbing 3-5 steps with a railing? : A Lot ?6 Click Score: 19 ? ?  ?End of Session Equipment Utilized During Treatment: Gait belt ?Activity Tolerance: Patient tolerated treatment well ?Patient left: in bed;with call bell/phone within reach ?Nurse Communication: Mobility status ?PT Visit Diagnosis: Unsteadiness on feet (R26.81);Difficulty in walking, not elsewhere classified (R26.2);Other abnormalities of gait and mobility (R26.89);Muscle weakness (generalized) (M62.81) ?  ? ? ?Time: 1329-1400 ?PT Time Calculation (min) (ACUTE ONLY): 31 min ? ?Charges:  $Gait Training: 8-22 mins ?$Therapeutic Exercise:  8-22 mins          ?          ? ?Salem Caster. Fairly IV, PT, DPT ?Physical Therapist- [place]  ?[facility]  ?06/29/2021, 2:19 PM ? ?

## 2021-06-29 NOTE — Progress Notes (Signed)
? ? ?Progress Note ? ?Patient Name: Cameron Foster. ?Date of Encounter: 06/29/2021 ? ?Primary Cardiologist: End ? ?Subjective  ? ?He denies any chest pain, palpitations, dyspnea, dizziness, presyncope, or syncope.  He remains in A-fib with ventricular rates in the 90s to low 100s at rest, though will peak into the 130s bpm with movement in the bed.  He has tolerated transition from Toprol to frequently dosed Lopressor.  He remains on diltiazem drip at 5 mg/h.  BP stable. ? ?Inpatient Medications  ?  ?Scheduled Meds: ? amoxicillin-clavulanate  1 tablet Oral Q12H  ? atorvastatin  40 mg Oral q1800  ? folic acid  1 mg Oral Daily  ? [START ON 06/30/2021] furosemide  40 mg Oral Daily  ? guaiFENesin  600 mg Oral BID  ? loratadine  10 mg Oral Daily  ? meclizine  25 mg Oral Daily  ? metoprolol tartrate  37.5 mg Oral Q6H  ? multivitamin with minerals  1 tablet Oral Daily  ? multivitamin with minerals  1 tablet Oral Daily  ? nicotine  21 mg Transdermal Daily  ? pantoprazole  40 mg Oral Daily  ? potassium chloride  40 mEq Oral BID  ? senna  1 tablet Oral Daily  ? sertraline  50 mg Oral Daily  ? sodium chloride flush  3 mL Intravenous Q12H  ? thiamine  100 mg Oral Daily  ? Or  ? thiamine  100 mg Intravenous Daily  ? vitamin B-12  1,000 mcg Oral Daily  ? ?Continuous Infusions: ? sodium chloride    ? diltiazem (CARDIZEM) infusion 5 mg/hr (06/29/21 0035)  ? ?PRN Meds: ?sodium chloride, acetaminophen **OR** acetaminophen, fluticasone, guaiFENesin-dextromethorphan, melatonin, morphine injection, nitroGLYCERIN, ondansetron **OR** ondansetron (ZOFRAN) IV, sodium chloride flush  ? ?Vital Signs  ?  ?Vitals:  ? 06/28/21 1645 06/28/21 2051 06/28/21 2340 06/29/21 0338  ?BP: 123/82 128/74 129/85 131/79  ?Pulse: 94 73 86 (!) 107  ?Resp: 16 20 19    ?Temp: 98.1 ?F (36.7 ?C) 98.2 ?F (36.8 ?C) 98.1 ?F (36.7 ?C) 98.4 ?F (36.9 ?C)  ?TempSrc:    Oral  ?SpO2: 95% 96% 96%   ?Weight:      ?Height:      ? ? ?Intake/Output Summary (Last 24 hours) at  06/29/2021 1004 ?Last data filed at 06/29/2021 0300 ?Gross per 24 hour  ?Intake 791.18 ml  ?Output 1800 ml  ?Net -1008.82 ml  ? ?Filed Weights  ? 06/21/21 2033 06/24/21 0901  ?Weight: 90.7 kg 90.7 kg  ? ? ?Telemetry  ?  ?A-fib with ventricular rates in the 90s to 100s bpm - Personally Reviewed ? ?ECG  ?  ?No new tracings - Personally Reviewed ? ?Physical Exam  ? ?GEN: No acute distress.   ?Neck: No JVD. ?Cardiac: Irregularly irregular, no murmurs, rubs, or gallops.  ?Respiratory: Clear to auscultation bilaterally.  ?GI: Soft, nontender, non-distended.   ?MS: No edema; No deformity. ?Neuro:  Alert and oriented x 3; Nonfocal.  ?Psych: Normal affect. ? ?Labs  ?  ?Chemistry ?Recent Labs  ?Lab 06/26/21 ?0416 06/27/21 ?0411 06/28/21 ?0426  ?NA 136 136 136  ?K 3.1* 3.6 3.9  ?CL 103 102 102  ?CO2 27 26 29   ?GLUCOSE 114* 125* 128*  ?BUN 7* 7* 6*  ?CREATININE 1.15 1.13 1.12  ?CALCIUM 8.4* 8.6* 8.8*  ?GFRNONAA >60 >60 >60  ?ANIONGAP 6 8 5   ?  ? ?Hematology ?Recent Labs  ?Lab 06/26/21 ?0416 06/27/21 ?0411 06/28/21 ?0426  ?WBC 13.4* 11.5* 10.4  ?  RBC 3.72* 3.70* 3.66*  ?HGB 10.8* 10.7* 10.5*  ?HCT 33.7* 33.7* 33.3*  ?MCV 90.6 91.1 91.0  ?MCH 29.0 28.9 28.7  ?MCHC 32.0 31.8 31.5  ?RDW 12.9 12.9 12.7  ?PLT 246 258 253  ? ? ?Cardiac EnzymesNo results for input(s): TROPONINI in the last 168 hours. No results for input(s): TROPIPOC in the last 168 hours.  ? ?BNP ?Recent Labs  ?Lab 06/25/21 ?1706 06/26/21 ?1835 06/28/21 ?0426  ?BNP 498.6* 662.7* 569.3*  ?  ? ?DDimer  ?Recent Labs  ?Lab 06/27/21 ?4742  ?DDIMER 9.33*  ?  ? ?Radiology  ?  ?No results found. ? ?Cardiac Studies  ? ?2D echo 06/26/2021: ?1. Left ventricular ejection fraction, by estimation, is 45 to 50%. The  ?left ventricle has mildly decreased function. The left ventricle  ?demonstrates global hypokinesis. There is moderate concentric left  ?ventricular hypertrophy. Left ventricular  ?diastolic parameters are indeterminate. Elevated left ventricular  ?end-diastolic pressure.   ? 2. Right ventricular systolic function is normal. The right ventricular  ?size is normal. There is normal pulmonary artery systolic pressure.  ? 3. Left atrial size was mildly dilated.  ? 4. The mitral valve is normal in structure. Mild mitral valve  ?regurgitation. No evidence of mitral stenosis.  ? 5. The aortic valve is tricuspid. There is mild calcification of the  ?aortic valve. Aortic valve regurgitation is not visualized. No aortic  ?stenosis is present.  ? 6. Aortic dilatation noted. There is mild dilatation of the aortic root,  ?measuring 41 mm. There is mild dilatation of the ascending aorta,  ?measuring 39 mm.  ? 7. The inferior vena cava is dilated in size with <50% respiratory  ?variability, suggesting right atrial pressure of 15 mmHg. ?__________ ?  ?Zio patch 06/2019: ?The patient was monitored for 13 days, 19 hours. ?The predominant rhythm was sinus with an average rate of 74 bpm (range 54 to 120 bpm in sinus). ?Occasional PACs (~4% burden) and rare PVCs were noted. ?There were 125 episodes of paroxysmal supraventricular tachycardia lasting up to 17.5 seconds with a maximum rate of 187 bpm. ?No sustained arrhythmia or prolonged pause was identified. ?There were no patient triggered events. ?  ?Predominantly sinus rhythm with occasional PACs and rare PVCs.  Multiple episodes of PSVT noted, lasting up to 17.5 seconds. ?__________ ?  ?2D echo 06/09/2019: ?1. Left ventricular ejection fraction, by estimation, is 55 to 60%. The  ?left ventricle has normal function. The left ventricle has no regional  ?wall motion abnormalities. Left ventricular diastolic parameters were  ?normal.  ? 2. Right ventricular systolic function is normal. The right ventricular  ?size is normal.  ? 3. The mitral valve is normal in structure and function. Trivial mitral  ?valve regurgitation.  ? 4. The aortic valve is normal in structure and function. Aortic valve  ?regurgitation is not visualized. ?__________ ?  ?2D echo  03/29/2018: ?- Left ventricle: The cavity size was normal. Systolic function was  ?  normal. The estimated ejection fraction was in the range of 60%  ?  to 65%. Wall motion was normal; there were no regional wall  ?  motion abnormalities. Left ventricular diastolic function  ?  parameters were normal.  ?- Aortic valve: There was mild regurgitation.  ?- Left atrium: The atrium was mildly dilated.  ?- Right ventricle: Systolic function was normal.  ?- Atrial septum: Suspected small patent foramen ovale by color flow  ?  Doppler. .  ?- Pulmonary arteries: Systolic pressure was within  the normal  ?  range. ?__________ ?  ?2D echo 10/05/2015: ?- Left ventricle: The cavity size was mildly dilated. There was  ?  mild concentric hypertrophy. Systolic function was normal. The  ?  estimated ejection fraction was in the range of 50% to 55%. Wall  ?  motion was normal; there were no regional wall motion  ?  abnormalities. Left ventricular diastolic function parameters  ?  were normal.  ?- Aortic valve: There was trivial regurgitation.  ?- Mitral valve: There was mild regurgitation.  ?- Left atrium: The atrium was mildly dilated. ?__________ ?  ?Lexiscan MPI 10/02/2015: ?There was no ST segment deviation noted during stress. ?No T wave inversion was noted during stress. ?The study is normal. ?This is a low risk study. ?The left ventricular ejection fraction is mildly decreased (45-54%). ?EF appears close to normal visually. recommend an echocardiogram ? ?Patient Profile  ?   ?81 y.o. male with history of hx of lone Afib in 1995 not on [place], subarachnoid hemorrhage of uncertain etiology in 4599 complicated by small lacunar stroke in 7741 felt to be embolic, HTN, HLD, prostate cancer, chronic lower extremity edema related to lymphedema, and alcohol use who was admitted on 3/7 with hematochezia and is being seen today for the evaluation of Afib with RVR at the request of Dr. [place] Pink. ? ?Assessment & Plan  ?  ?1. Afib with RVR: ?-He  remains in Afib with ventricular rates in the 90s to low 100s for the most part, though will have episodes into the 130s bpm  ?-Historically, he has not been maintained on [facility] as an outpatient given lone episode

## 2021-06-29 NOTE — Progress Notes (Signed)
Assumed care of pt at 1900. A&O x4. RA. No c/o pain overnight. Cardizem gtt titrated down this evening per MAR. HR 70-90s overnight. Vitals and assessment per flowsheets. Pt updated on POC w/ verbalized understanding. Call bell within reach, pt making needs known. Comfort and safety maintained.  ? ?Left FA IV infiltrated overnight. Site red/swollen/painful. IV removed and ice applied.  ?

## 2021-06-29 NOTE — Progress Notes (Signed)
Mobility Specialist - Progress Note ? ? 06/29/21 1200  ?Mobility  ?Activity Refused mobility  ? ? ? ?Pt sleeping on arrival, awakens to voice. Pt declined mobility at this time, despite encouragement, to finish nap. Reports lack of sleep last night. Pt asks author to return after 1400 this date. Will attempt as time permits.  ? ? ?Kathee Delton ?Mobility Specialist ?06/29/21, 12:36 PM ? ? ? ? ?

## 2021-06-30 DIAGNOSIS — I4891 Unspecified atrial fibrillation: Secondary | ICD-10-CM | POA: Diagnosis not present

## 2021-06-30 DIAGNOSIS — K51511 Left sided colitis with rectal bleeding: Secondary | ICD-10-CM | POA: Diagnosis not present

## 2021-06-30 MED ORDER — APIXABAN 5 MG PO TABS
5.0000 mg | ORAL_TABLET | Freq: Two times a day (BID) | ORAL | Status: DC
  Administered 2021-06-30 – 2021-07-03 (×7): 5 mg via ORAL
  Filled 2021-06-30 (×7): qty 1

## 2021-06-30 MED ORDER — DIGOXIN 125 MCG PO TABS
0.1250 mg | ORAL_TABLET | Freq: Every day | ORAL | Status: DC
  Administered 2021-06-30: 0.125 mg via ORAL
  Filled 2021-06-30 (×2): qty 1

## 2021-06-30 MED ORDER — METOPROLOL TARTRATE 50 MG PO TABS
100.0000 mg | ORAL_TABLET | Freq: Two times a day (BID) | ORAL | Status: DC
  Filled 2021-06-30: qty 2

## 2021-06-30 MED ORDER — METOPROLOL TARTRATE 25 MG PO TABS
37.5000 mg | ORAL_TABLET | Freq: Four times a day (QID) | ORAL | Status: DC
  Administered 2021-06-30 – 2021-07-01 (×4): 37.5 mg via ORAL
  Filled 2021-06-30 (×4): qty 2

## 2021-06-30 NOTE — Progress Notes (Signed)
Assumed care of pt at 1900. A&O x4. RA. No c/o pain overnight. Asking appropriate questions regarding POC and updated w/ verbalized understanding. Dilt gtt remains off this shift. Medication administration per MAR. HR remaining 70-90s at rest, however, pt not OOB overnight. Full assessment per flowsheets. Call bell within reach, making needs known. Comfort and safety maintained.  ?

## 2021-06-30 NOTE — Progress Notes (Signed)
[facility] Progress Note ? ?Patient: Cameron Foster.    OMB:[id number]  DOA: 06/22/2021    ? ?Date of Service: the patient was seen and examined on 06/30/2021 ? ?No chief complaint on file. ? ?Brief hospital course: ?Taken from previous note ?81 year old male past medical history of alcoholism, atrial fibrillation previous GI bleed from pyloric ulcer as well as diverticulosis presented to the emergency room on 3/7 with complaints of rectal bleeding/bright red blood per rectum and hemoglobin noted to be 10.4 (baseline of 13.2).  Admitted to the hospitalist service & GI consulted. CT angiogram noted left-sided colitis felt to be infectious versus inflammatory. ?  ?Hemoglobin remained stable and actually improved to 11.1 x 3/10.  However, on early morning of 3/10, patient went into new onset rapid atrial fibrillation.  Heart rate improved although still not fully controlled.  Remaining in atrial fibrillation.  Echocardiogram done noted mild decrease ejection fraction of 45-50%.  Patient slightly volume overloaded and received IV Lasix.  On evening of 3/11, started going into persistent rapid atrial fibrillation, so patient started on Cardizem drip. ?  ?Patient had some chest discomfort on the early morning of 3/12.  Troponin unremarkable.  Chest pain resolved on its own without morphine or nitroglycerin.  EKG noted atrial fibrillation with right bundle branch block and left anterior fascicular block.  Both branch blocks are unchanged from previous EKGs prior to hospitalization. ?  ?Patient heart rate remains somewhat difficult to control which becomes normal at rest, but even with mild exertion starts to jump up above 100.  Cardiology consulted and following.  After discussion with cardiology and with patient, looking to start anticoagulation ? ? ? ?Assessment and Plan: ?* Left sided colitis ([name]) ?Ischemic versus infectious.  Given persistently elevated [name] blood cell count, started on antibiotics on 3/8.   Hemoglobin remained stable.  Flexible sigmoidoscopy notes possible ischemic colitis.  [name] count normalized.  Completed antibiotics 3/14. ?  ?Paroxysmal atrial fibrillation ([name]) ?Patient states he had atrial fibrillation once before, back in the 90s.  Echocardiogram done in 2021 noted no signs of atrial enlargement at that time.  When patient was admitted during this hospitalization, was in normal sinus rhythm.  Now staying in atrial fibrillation. Echocardiogram notes mild systolic heart failure and mildly dilated atria which are new compared to echocardiogram from 2 years ago. ?  ? Have titrated up his Toprol-XL to 100 from 50 starting 3/11 morning.  Started on Cardizem drip 3/11 evening when he went into rapid atrial fibrillation.  Despite bolus and infusion at 10, still continued to have resting rapid ventricular rate so loaded with digoxin starting 3/12 morning.  Patient at times even with mild exertion will start to have significantly elevated heart rate in the 120s.  Have consulted cardiology. ?  ?After discussion with cardiology and with patient, I think he needs to be a candidate for anticoagulation.  Patient did have a subarachnoid bleed with a hunt-hess score of 2 in June 2020.  He has had a bleeding ulcer in the somewhat recent past, but this hospitalization, had some rectal bleeding only from colitis.  That said, he is a very high risk for thromboembolism from this atrial fibrillation.  In review of some literature, for high risk candidates, they recommend patient can start back on anticoagulation within several weeks, so given that has been 3-1/2 years since his hemorrhage, would feel that benefit outweighs risk to start anticoagulation. ?3/15 started Eliquis 5 mg p.o. twice daily as per cardiology,  continue Lopressor 37.5 every 6 hourly and continue digoxin 0.125 mg daily ?Patient may need TEE/cardioversion, patient was made n.p.o. after midnight by cardiology ? ? ?Acute clinical systolic heart  failure ([name]) ?Noted on echocardiogram.  Ejection fraction of 45 to 50%.  Mild decreased in systolic function when compared with echocardiogram 2 years ago.  Likely exacerbated by volume resuscitation following bleed.  Mild.  Received IV Lasix x2.  Now appears to be euvolemic. ?  ?Acute blood loss anemia ?Felt to be secondary to left-sided colitis.  Infectious favored over inflammatory.  Continue to monitor closely.  Cultures unremarkable.  Hemoglobin remained stable. ?  ?Essential hypertension ?Blood pressure is stable, slightly softer with atrial fibrillation when rapid. ?Continue lisinopril and have increased metoprolol ?  ?Depression, major, single episode, mild ([name]) ?Continue Zoloft. ?  ?Alcohol use disorder, moderate, dependence ([name]) ?Patient with a history of alcohol use disorder ?On CIWA protocol and administer lorazepam for CIWA score of 8 or greater ?Patient on MVI, thiamine and folic acid ?  ?Obesity ?Meets criteria with BMI greater than 30 ?  ?Hyperlipidemia LDL goal <70 ?Continue statin ?  ?Constipation ?Related to opioid use for pain control ?We will start patient on MiraLAX 17 g daily as well as senna 1 tablet daily.   ?Now having good bowel movements and MiraLAX discontinued . ?  ?Osteoarthritis of hip ?Patient had plans for arthroplasty on Monday, 3/13.  Discussed with patient.  Given now new onset atrial fibrillation, would favor postponing surgery for a few weeks. ?  ?   ?Body mass index is 30.41 kg/m?.  ?   ?  ?Consultants: ?Gastroenterology ?Cardiology ?  ?Procedures: ?flexible sigmoidoscopy 3/9 ?Echocardiogram: Ejection fraction of 45 to 50%.  Mildly dilated left atria ?  ?Antimicrobials: ?IV Zosyn 3/7 - 3/9 ?Augmentin 3/9 - 3/14 ? ?Diet: Heart healthy diet ?DVT Prophylaxis: Therapeutic Anticoagulation with Eliquis   ? ?Advance goals of care discussion: Full code ? ?Family Communication: family was not present at bedside, at the time of interview.  ?The pt provided permission to discuss  medical plan with the family. Opportunity was given to ask question and all questions were answered satisfactorily.  ? ?Disposition:  ?Pt is from Home, admitted with colitis GI bleeding and developed new A-fib with RVR, still has tachycardia, seen by cardiology, started on Eliquis on 3/15, which precludes a safe discharge. ?Discharge to home with home health PT, when clinically stable, most likely tomorrow a.m. ? ?Subjective: No significant overnight events, patient denied any GI bleeding, no abdominal pain, denied any chest pain or palpitations, no shortness of breath. ?Patient is aware that he has been started on oral blood thinners, patient feels okay to go home. ?We will continue to monitor tonight, patient has been made n.p.o. overnight for possible TEE and cardioversion ?Plan is to follow cardiology for disposition plan ? ?Physical Exam: ?General:  alert oriented to time, place, and person.  ?Appear in no distress, affect appropriate ?Eyes: PERRLA ?ENT: Oral Mucosa Clear, moist  ?Neck: no JVD,  ?Cardiovascular: S1 and S2 Present, no Murmur,  ?Respiratory: good respiratory effort, Bilateral Air entry equal and Decreased, no Crackles, no wheezes ?Abdomen: Bowel Sound present, Soft and no tenderness,  ?Skin: no rashes ?Extremities: no Pedal edema, no calf tenderness ?Neurologic: without any new focal findings ?Gait not checked due to patient safety concerns ? ?Vitals:  ? 06/30/21 1230 06/30/21 1236 06/30/21 1335 06/30/21 1444  ?BP: 101/69 91/80 110/82 105/86  ?Pulse: (!) 151 (!) 144 (!) 144 Marland Kitchen)  144  ?Resp: 16  14 16   ?Temp: 97.6 ?F (36.4 ?C)     ?TempSrc:      ?SpO2: 96%  96% 100%  ?Weight:      ?Height:      ? ? ?Intake/Output Summary (Last 24 hours) at 06/30/2021 1541 ?Last data filed at 06/30/2021 1405 ?Gross per 24 hour  ?Intake 723 ml  ?Output 1700 ml  ?Net -977 ml  ? ?Filed Weights  ? 06/21/21 2033 06/24/21 0901  ?Weight: 90.7 kg 90.7 kg  ? ? ?Data Reviewed: ?I have personally reviewed and interpreted daily  labs, tele strips, imagings as discussed above. ?I reviewed all nursing notes, pharmacy notes, vitals, pertinent old records ?I have discussed plan of care as described above with RN and patient/family. ? ?CBC: ?R

## 2021-06-30 NOTE — Progress Notes (Signed)
Mobility Specialist - Progress Note ? ? ? 06/30/21 1500  ?Mobility  ?Activity Refused mobility  ? ? ?Pt refused mobility d/t recent C-B transfer. Will attempt at another date and time. ? ?Merrily Brittle ?Mobility Specialist ?06/30/21, 3:35 PM ? ? ? ? ?

## 2021-06-30 NOTE — Progress Notes (Signed)
? ?Progress Note ? ?Patient Name: Cameron Foster. ?Date of Encounter: 06/30/2021 ? ?[facility] Cardiologist: [name], MD  ? ?Subjective  ? ?Patient still in afib with relatively controlled rates.He is asymptomatic.  He is very frustrated he has been here for so long, possible some baseline confusion.   ? ?Inpatient Medications  ?  ?Scheduled Meds: ? atorvastatin  40 mg Oral q1800  ? folic acid  1 mg Oral Daily  ? furosemide  40 mg Oral Daily  ? guaiFENesin  600 mg Oral BID  ? loratadine  10 mg Oral Daily  ? meclizine  25 mg Oral Daily  ? metoprolol tartrate  37.5 mg Oral Q6H  ? multivitamin with minerals  1 tablet Oral Daily  ? multivitamin with minerals  1 tablet Oral Daily  ? nicotine  21 mg Transdermal Daily  ? pantoprazole  40 mg Oral Daily  ? potassium chloride  40 mEq Oral BID  ? senna  1 tablet Oral Daily  ? sertraline  50 mg Oral Daily  ? sodium chloride flush  3 mL Intravenous Q12H  ? thiamine  100 mg Oral Daily  ? Or  ? thiamine  100 mg Intravenous Daily  ? vitamin B-12  1,000 mcg Oral Daily  ? ?Continuous Infusions: ? sodium chloride    ? ?PRN Meds: ?sodium chloride, acetaminophen **OR** acetaminophen, fluticasone, guaiFENesin-dextromethorphan, melatonin, morphine injection, nitroGLYCERIN, ondansetron **OR** ondansetron (ZOFRAN) IV, sodium chloride flush  ? ?Vital Signs  ?  ?Vitals:  ? 06/29/21 1958 06/29/21 2335 06/30/21 0308 06/30/21 0749  ?BP: 134/74 122/78 (!) 128/91 125/90  ?Pulse: 85 93 99 62  ?Resp: 16 14 16 16   ?Temp: 98.1 ?F (36.7 ?C) 98.3 ?F (36.8 ?C) 98 ?F (36.7 ?C)   ?TempSrc: Oral Oral Oral   ?SpO2: 95% 97% 96% 96%  ?Weight:      ?Height:      ? ? ?Intake/Output Summary (Last 24 hours) at 06/30/2021 1005 ?Last data filed at 06/30/2021 0900 ?Gross per 24 hour  ?Intake 240 ml  ?Output 1950 ml  ?Net -1710 ml  ? ?Last 3 Weights 06/24/2021 06/21/2021 06/16/2021  ?Weight (lbs) 200 lb 200 lb 211 lb  ?Weight (kg) 90.719 kg 90.719 kg 95.709 kg  ?   ? ?Telemetry  ?  ?Afib Hr 80-110s - Personally  Reviewed ? ?ECG  ?  ?No new - Personally Reviewed ? ?Physical Exam  ? ?GEN: No acute distress.   ?Neck: No JVD ?Cardiac: Irreg Irreg, no murmurs, rubs, or gallops.  ?Respiratory: Crackles right lower lobe. ?GI: Soft, nontender, non-distended  ?MS: trace edema; No deformity. ?Neuro:  Nonfocal  ?Psych: Normal affect  ? ?Labs  ?  ?High Sensitivity Troponin:   ?Recent Labs  ?Lab 06/27/21 ?0411 06/27/21 ?0840  ?TROPONINIHS 18* 14  ?   ?Chemistry ?Recent Labs  ?Lab 06/25/21 ?1333 06/26/21 ?0416 06/27/21 ?0411 06/28/21 ?0426  ?NA  --  136 136 136  ?K  --  3.1* 3.6 3.9  ?CL  --  103 102 102  ?CO2  --  27 26 29   ?GLUCOSE  --  114* 125* 128*  ?BUN  --  7* 7* 6*  ?CREATININE  --  1.15 1.13 1.12  ?CALCIUM  --  8.4* 8.6* 8.8*  ?MG 1.8  --   --  1.9  ?GFRNONAA  --  >60 >60 >60  ?ANIONGAP  --  6 8 5   ?  ?Lipids No results for input(s): CHOL, TRIG, HDL, LABVLDL, LDLCALC, CHOLHDL  in the last 168 hours.  ?Hematology ?Recent Labs  ?Lab 06/26/21 ?0416 06/27/21 ?0411 06/28/21 ?0426  ?WBC 13.4* 11.5* 10.4  ?RBC 3.72* 3.70* 3.66*  ?HGB 10.8* 10.7* 10.5*  ?HCT 33.7* 33.7* 33.3*  ?MCV 90.6 91.1 91.0  ?MCH 29.0 28.9 28.7  ?MCHC 32.0 31.8 31.5  ?RDW 12.9 12.9 12.7  ?PLT 246 258 253  ? ?Thyroid  ?Recent Labs  ?Lab 06/28/21 ?0426  ?TSH 3.077  ?  ?BNP ?Recent Labs  ?Lab 06/25/21 ?1706 06/26/21 ?1835 06/28/21 ?0426  ?BNP 498.6* 662.7* 569.3*  ?  ?DDimer  ?Recent Labs  ?Lab 06/27/21 ?5809  ?DDIMER 9.33*  ?  ? ?Radiology  ?  ?No results found. ? ?Cardiac Studies  ? ?Echo 06/26/21 ? 1. Left ventricular ejection fraction, by estimation, is 45 to 50%. The  ?left ventricle has mildly decreased function. The left ventricle  ?demonstrates global hypokinesis. There is moderate concentric left  ?ventricular hypertrophy. Left ventricular  ?diastolic parameters are indeterminate. Elevated left ventricular  ?end-diastolic pressure.  ? 2. Right ventricular systolic function is normal. The right ventricular  ?size is normal. There is normal pulmonary artery  systolic pressure.  ? 3. Left atrial size was mildly dilated.  ? 4. The mitral valve is normal in structure. Mild mitral valve  ?regurgitation. No evidence of mitral stenosis.  ? 5. The aortic valve is tricuspid. There is mild calcification of the  ?aortic valve. Aortic valve regurgitation is not visualized. No aortic  ?stenosis is present.  ? 6. Aortic dilatation noted. There is mild dilatation of the aortic root,  ?measuring 41 mm. There is mild dilatation of the ascending aorta,  ?measuring 39 mm.  ? 7. The inferior vena cava is dilated in size with <50% respiratory  ?variability, suggesting right atrial pressure of 15 mmHg.  ? ? ?Heart monitor 06/2019 ?The patient was monitored for 13 days, 19 hours. ?The predominant rhythm was sinus with an average rate of 74 bpm (range 54 to 120 bpm in sinus). ?Occasional PACs (~4% burden) and rare PVCs were noted. ?There were 125 episodes of paroxysmal supraventricular tachycardia lasting up to 17.5 seconds with a maximum rate of 187 bpm. ?No sustained arrhythmia or prolonged pause was identified. ?There were no patient triggered events. ?  ?Predominantly sinus rhythm with occasional PACs and rare PVCs.  Multiple episodes of PSVT noted, lasting up to 17.5 seconds. ? ?Patient Profile  ?   ?81 y.o. male with history of hx of lone Afib in 1995 not on [place], subarachnoid hemorrhage of uncertain etiology in 9833 complicated by small lacunar stroke in 8250 felt to be embolic, HTN, HLD, prostate cancer, chronic lower extremity edema related to lymphedema, and alcohol use who was admitted on 3/7 with hematochezia and is being seen for the evaluation of Afib with RVR. ? ?Assessment & Plan  ?  ?Afib RVR ?- remains in Afib with fairly controlled rates with occasional elevation.  ?- Reports remote Afib but has not been on [place] given h/o unprovoked SAH and  more recent GIB ?- CHADSVASC of 7 ?- Lopressor 37.67m Q4H ?- weaned off dilt drip ?- digoxin held ?- Appears plan is to start  anticoagulation, then we can consider TEE/DCCV. I will make him NPO at midnight. Will discuss with MD ? ?Acute HFrEF ?EF 45-50% ?- possible tachy-mediated in the setting of Afib RVR ?- lasix 40 mg daily ?- continue metoprolol ?- continue GDMT as able ? ?Hematochezia ?Acute blood loss ?Possible ischemic colitis ?- HGB low around 10,  stable ? ?Alcohol use ?- CIWA per IM ? ?HLD ?- continue statin ? ?For questions or updates, please contact [place] ?Please consult [email or web address] for contact info under  ? ?  ?   ?Signed, ?Keller Bounds Ninfa Meeker, PA-C  ?06/30/2021, 10:05 AM    ?

## 2021-06-30 NOTE — Progress Notes (Signed)
Physical Therapy Treatment ?Patient Details ?Name: Cameron Foster. ?MRN: [id number] ?DOB: 1940-08-03 ?Today's Date: 06/30/2021 ? ? ?History of Present Illness Cameron Foster. is a 81 y.o. male with medical history of chronic alcohol use, paroxysmal A-fib not on anticoagulation, CVA, hypertension, history of upper GI bleed secondary to pyloric ulcer who presents with bright red blood per rectum. ? ?  ?PT Comments  ? ? Patient received in bed, he reports he is feeling tired today. Agrees to PT session. Patient reports mild dizziness with initial sitting and standing which resolved after a minute or so. He ambulated to bathroom and then 150 feet around nurses station with RW and min guard. Cues for proximity to RW and posture. Patient will continue to benefit from skilled PT while here to improve strength, endurance and safety with mobility for return home.  ?      ?Recommendations for follow up therapy are one component of a multi-disciplinary discharge planning process, led by the attending physician.  Recommendations may be updated based on patient status, additional functional criteria and insurance authorization. ? ?Follow Up Recommendations ? Home health PT ?  ?  ?Assistance Recommended at Discharge Intermittent Supervision/Assistance  ?Patient can return home with the following A little help with walking and/or transfers;A little help with bathing/dressing/bathroom;Assistance with cooking/housework;Assist for transportation;Help with stairs or ramp for entrance ?  ?Equipment Recommendations ? Rolling walker (2 wheels)  ?  ?Recommendations for Other Services   ? ? ?  ?Precautions / Restrictions Precautions ?Precautions: Fall ?Restrictions ?Weight Bearing Restrictions: No  ?  ? ?Mobility ? Bed Mobility ?Overal bed mobility: Needs Assistance ?Bed Mobility: Supine to Sit ?  ?  ?Supine to sit: Min guard ?  ?  ?General bed mobility comments: increased effort to get sitting on side of bed ?  ? ?Transfers ?Overall  transfer level: Modified independent ?Equipment used: Rolling walker (2 wheels) ?Transfers: Sit to/from Stand ?Sit to Stand: Modified independent (Device/Increase time) ?  ?  ?  ?  ?  ?General transfer comment: increased effort to get to standing, used grab bar in bathroom for toilet transfer ?  ? ?Ambulation/Gait ?Ambulation/Gait assistance: Min guard ?Gait Distance (Feet): 150 Feet ?Assistive device: Rolling walker (2 wheels) ?Gait Pattern/deviations: Step-through pattern, Decreased step length - right, Decreased step length - left, Trunk flexed ?Gait velocity: WNL ?  ?  ?General Gait Details: cues for upright posture and staying close to RW ? ? ?Stairs ?  ?  ?  ?  ?  ? ? ?Wheelchair Mobility ?  ? ?Modified Rankin (Stroke Patients Only) ?  ? ? ?  ?Balance Overall balance assessment: Modified Independent ?Sitting-balance support: Feet supported ?Sitting balance-Leahy Scale: Good ?  ?  ?Standing balance support: Bilateral upper extremity supported, During functional activity ?Standing balance-Leahy Scale: Fair ?Standing balance comment: Can static stand and maintain balance without UE support. Benefits from at least single UE support. ?  ?  ?  ?  ?  ?  ?  ?  ?  ?  ?  ?  ? ?  ?Cognition Arousal/Alertness: Awake/alert ?Behavior During Therapy: [facility] for tasks assessed/performed ?Overall Cognitive Status: Within Functional Limits for tasks assessed ?  ?  ?  ?  ?  ?  ?  ?  ?  ?  ?  ?  ?  ?  ?  ?  ?  ?  ?  ? ?  ?Exercises   ? ?  ?General Comments   ?  ?  ? ?  Pertinent Vitals/Pain Pain Assessment ?Pain Assessment: No/denies pain  ? ? ?Home Living   ?  ?  ?  ?  ?  ?  ?  ?  ?  ?   ?  ?Prior Function    ?  ?  ?   ? ?PT Goals (current goals can now be found in the care plan section) Acute Rehab PT Goals ?Patient Stated Goal: to get stronger before going home ?PT Goal Formulation: With patient ?Time For Goal Achievement: 07/07/21 ?Potential to Achieve Goals: Good ?Progress towards PT goals: Progressing toward goals ? ?   ?Frequency ? ? ? Min 2X/week ? ? ? ?  ?PT Plan Current plan remains appropriate  ? ? ?Co-evaluation   ?  ?  ?  ?  ? ?  ?AM-PAC PT "6 Clicks" Mobility   ?Outcome Measure ? Help needed turning from your back to your side while in a flat bed without using bedrails?: None ?Help needed moving from lying on your back to sitting on the side of a flat bed without using bedrails?: A Little ?Help needed moving to and from a bed to a chair (including a wheelchair)?: A Little ?Help needed standing up from a chair using your arms (e.g., wheelchair or bedside chair)?: A Little ?Help needed to walk in hospital room?: A Little ?Help needed climbing 3-5 steps with a railing? : A Lot ?6 Click Score: 18 ? ?  ?End of Session Equipment Utilized During Treatment: Gait belt ?Activity Tolerance: Patient tolerated treatment well ?Patient left: in chair;with call bell/phone within reach ?Nurse Communication: Mobility status ?PT Visit Diagnosis: Unsteadiness on feet (R26.81);Difficulty in walking, not elsewhere classified (R26.2);Other abnormalities of gait and mobility (R26.89);Muscle weakness (generalized) (M62.81) ?  ? ? ?Time: 6433-2951 ?PT Time Calculation (min) (ACUTE ONLY): 24 min ? ?Charges:  $Gait Training: 23-37 mins          ?          ? ?Amanda Cockayne, PT, GCS ?06/30/21,12:34 PM ? ?

## 2021-07-01 ENCOUNTER — Other Ambulatory Visit (HOSPITAL_COMMUNITY): Payer: Self-pay

## 2021-07-01 DIAGNOSIS — F320 Major depressive disorder, single episode, mild: Secondary | ICD-10-CM

## 2021-07-01 DIAGNOSIS — D62 Acute posthemorrhagic anemia: Secondary | ICD-10-CM | POA: Diagnosis not present

## 2021-07-01 DIAGNOSIS — K559 Vascular disorder of intestine, unspecified: Secondary | ICD-10-CM

## 2021-07-01 DIAGNOSIS — F1020 Alcohol dependence, uncomplicated: Secondary | ICD-10-CM | POA: Diagnosis not present

## 2021-07-01 DIAGNOSIS — K529 Noninfective gastroenteritis and colitis, unspecified: Secondary | ICD-10-CM | POA: Diagnosis not present

## 2021-07-01 DIAGNOSIS — K51511 Left sided colitis with rectal bleeding: Secondary | ICD-10-CM | POA: Diagnosis not present

## 2021-07-01 DIAGNOSIS — R0789 Other chest pain: Secondary | ICD-10-CM

## 2021-07-01 DIAGNOSIS — I5021 Acute systolic (congestive) heart failure: Secondary | ICD-10-CM | POA: Diagnosis not present

## 2021-07-01 LAB — CBC
HCT: 33.9 % — ABNORMAL LOW (ref 39.0–52.0)
Hemoglobin: 10.8 g/dL — ABNORMAL LOW (ref 13.0–17.0)
MCH: 28.5 pg (ref 26.0–34.0)
MCHC: 31.9 g/dL (ref 30.0–36.0)
MCV: 89.4 fL (ref 80.0–100.0)
Platelets: 347 10*3/uL (ref 150–400)
RBC: 3.79 MIL/uL — ABNORMAL LOW (ref 4.22–5.81)
RDW: 12.8 % (ref 11.5–15.5)
WBC: 11.3 10*3/uL — ABNORMAL HIGH (ref 4.0–10.5)
nRBC: 0 % (ref 0.0–0.2)

## 2021-07-01 LAB — BASIC METABOLIC PANEL
Anion gap: 8 (ref 5–15)
BUN: 16 mg/dL (ref 8–23)
CO2: 26 mmol/L (ref 22–32)
Calcium: 9 mg/dL (ref 8.9–10.3)
Chloride: 99 mmol/L (ref 98–111)
Creatinine, Ser: 1.4 mg/dL — ABNORMAL HIGH (ref 0.61–1.24)
GFR, Estimated: 51 mL/min — ABNORMAL LOW (ref >60)
Glucose, Bld: 107 mg/dL — ABNORMAL HIGH (ref 70–99)
Potassium: 4.8 mmol/L (ref 3.5–5.1)
Sodium: 133 mmol/L — ABNORMAL LOW (ref 135–145)

## 2021-07-01 LAB — PHOSPHORUS: Phosphorus: 4.8 mg/dL — ABNORMAL HIGH (ref 2.5–4.6)

## 2021-07-01 LAB — MAGNESIUM: Magnesium: 1.9 mg/dL (ref 1.7–2.4)

## 2021-07-01 LAB — DIGOXIN LEVEL: Digoxin Level: 1.1 ng/mL (ref 0.8–2.0)

## 2021-07-01 MED ORDER — METOPROLOL TARTRATE 50 MG PO TABS
50.0000 mg | ORAL_TABLET | Freq: Four times a day (QID) | ORAL | Status: DC
  Administered 2021-07-01 – 2021-07-02 (×4): 50 mg via ORAL
  Filled 2021-07-01 (×4): qty 1

## 2021-07-01 MED ORDER — METOPROLOL TARTRATE 5 MG/5ML IV SOLN
5.0000 mg | Freq: Once | INTRAVENOUS | Status: AC
Start: 2021-07-01 — End: 2021-07-01
  Administered 2021-07-01: 5 mg via INTRAVENOUS
  Filled 2021-07-01: qty 5

## 2021-07-01 MED ORDER — AMIODARONE HCL 200 MG PO TABS
200.0000 mg | ORAL_TABLET | Freq: Two times a day (BID) | ORAL | Status: DC
  Administered 2021-07-01 – 2021-07-03 (×5): 200 mg via ORAL
  Filled 2021-07-01 (×5): qty 1

## 2021-07-01 MED ORDER — FUROSEMIDE 10 MG/ML IJ SOLN
40.0000 mg | Freq: Once | INTRAMUSCULAR | Status: AC
  Administered 2021-07-01: 40 mg via INTRAVENOUS
  Filled 2021-07-01: qty 4

## 2021-07-01 NOTE — Progress Notes (Addendum)
After discussion with MD, the plan continues to be we need to see if the patient will tolerate [place] given history of SAH and presentation this admission with hematochezia prior to considering potential TEE/DCCV.  ? ?Recommendations: ?-Increase metoprolol to 50 mg q 6 hours with hold parameters for systolic BP < 935 mmHg ?-Add po amiodarone 200 mg bid (not ideal, but there are limited options for rate control otherwise) ?

## 2021-07-01 NOTE — Progress Notes (Signed)
OT Cancellation Note ? ?Patient Details ?Name: Cameron Foster. ?MRN: [id number] ?DOB: Nov 07, 1940 ? ? ?Cancelled Treatment:    Reason Eval/Treat Not Completed: Fatigue/lethargy limiting ability to participate;Other (comment) (pt reports low BP; not feeling up to trying therapy at this time).  Will attempt again when able. ?Leta Speller, MS, OTR/L ? ?Cameron Foster ?07/01/2021, 3:00 PM ?

## 2021-07-01 NOTE — TOC Benefit Eligibility Note (Signed)
Patient Advocate Encounter ? ?Insurance verification completed.   ? ?The patient is currently admitted and upon discharge could be taking Eliquis 5 mg. ? ?The current 30 day co-pay is, $571.77 due to a $505.00.  ? ?The patient is insured through [employer] Medicare Part D  ? ? ? ?Lyndel Safe, CPhT ?Pharmacy Patient Advocate Specialist ?[place] Patient Advocate Team ?Direct Number: [phone]  Fax: [phone] ? ? ? ? ? ?  ?

## 2021-07-01 NOTE — Progress Notes (Signed)
[facility] Progress Note ? ?Patient: Cameron Foster.    DPO:[id number]  DOA: 06/22/2021    ? ?Date of Service: the patient was seen and examined on 07/01/2021 ? ?No chief complaint on file. ? ?Brief hospital course: ?Taken from previous note ?81 year old male past medical history of alcoholism, atrial fibrillation previous GI bleed from pyloric ulcer as well as diverticulosis presented to the emergency room on 3/7 with complaints of rectal bleeding/bright red blood per rectum and hemoglobin noted to be 10.4 (baseline of 13.2).  Admitted to the hospitalist service & GI consulted. CT angiogram noted left-sided colitis felt to be infectious versus inflammatory. ?  ?Hemoglobin remained stable and actually improved to 11.1 x 3/10.  However, on early morning of 3/10, patient went into new onset rapid atrial fibrillation.  Heart rate improved although still not fully controlled.  Remaining in atrial fibrillation.  Echocardiogram done noted mild decrease ejection fraction of 45-50%.  Patient slightly volume overloaded and received IV Lasix.  On evening of 3/11, started going into persistent rapid atrial fibrillation, so patient started on Cardizem drip. ?  ?Patient had some chest discomfort on the early morning of 3/12.  Troponin unremarkable.  Chest pain resolved on its own without morphine or nitroglycerin.  EKG noted atrial fibrillation with right bundle branch block and left anterior fascicular block.  Both branch blocks are unchanged from previous EKGs prior to hospitalization. ?  ?Patient heart rate remains somewhat difficult to control which becomes normal at rest, but even with mild exertion starts to jump up above 100.  Cardiology consulted and following.  After discussion with cardiology and with patient, looking to start anticoagulation ? ? ? ?Assessment and Plan: ?* Left sided colitis ([place]) ?Ischemic versus infectious.  Given persistently elevated [place] blood cell count, started on antibiotics on 3/8.   Hemoglobin remained stable.  Flexible sigmoidoscopy notes possible ischemic colitis.  Ganim count normalized.  Completed antibiotics 3/14. ?  ?Paroxysmal atrial fibrillation ([place]) ?Patient states he had atrial fibrillation once before, back in the 90s.  Echocardiogram done in 2021 noted no signs of atrial enlargement at that time.  When patient was admitted during this hospitalization, was in normal sinus rhythm.  Now staying in atrial fibrillation. Echocardiogram notes mild systolic heart failure and mildly dilated atria which are new compared to echocardiogram from 2 years ago. ?  ? Have titrated up his Toprol-XL to 100 from 50 starting 3/11 morning.  Started on Cardizem drip 3/11 evening when he went into rapid atrial fibrillation.  Despite bolus and infusion at 10, still continued to have resting rapid ventricular rate so loaded with digoxin starting 3/12 morning.  Patient at times even with mild exertion start to have significantly elevated heart rate in the 120s.  Have consulted cardiology. ?  ?After discussion with cardiology and with patient, I think he needs to be a candidate for anticoagulation.  Patient did have a subarachnoid bleed with a hunt-hess score of 2 in June 2020.  He has had a bleeding ulcer in the somewhat recent past, but this hospitalization, had some rectal bleeding only from colitis.  That said, he is a very high risk for thromboembolism from this atrial fibrillation.  In review of some literature, for high risk candidates, they recommend patient can start back on anticoagulation within several weeks, so given that has been 3-1/2 years since his hemorrhage, would feel that benefit outweighs risk to start anticoagulation. ?3/15 started Eliquis 5 mg p.o. twice daily as per cardiology, continue  Lopressor 37.5 every 6 hourly and continue digoxin 0.125 mg daily ?Patient may need TEE/cardioversion,  ?3/16 started metoprolol 50 every 6 hourly and amiodarone 20 mg p.o. twice daily as per  cardiology ? ? ?Acute clinical systolic heart failure ([place]) ?Noted on echocardiogram.  Ejection fraction of 45 to 50%.  Mild decreased in systolic function when compared with echocardiogram 2 years ago.  Likely exacerbated by volume resuscitation following bleed.  Mild.  Received IV Lasix x2.  Now appears to be euvolemic. ?  ?Acute blood loss anemia ?Felt to be secondary to left-sided colitis.  Infectious favored over inflammatory.  Continue to monitor closely.  Cultures unremarkable.  Hemoglobin remained stable. ?  ?Essential hypertension ?Lisinopril was discontinued on 3/13 ?Continue Lopressor as above with holding parameters ?Blood pressure remains soft due to A-fib with RVR ? ?  ?Depression, major, single episode, mild ([place]) ?Continue Zoloft. ?  ?Alcohol use disorder, moderate, dependence ([place]) ?Patient with a history of alcohol use disorder ?On CIWA protocol and administer lorazepam for CIWA score of 8 or greater ?Patient on MVI, thiamine and folic acid ?  ?Obesity ?Meets criteria with BMI greater than 30 ?  ?Hyperlipidemia LDL goal <70 ?Continue statin ?  ?Constipation ?Related to opioid use for pain control ?We will start patient on MiraLAX 17 g daily as well as senna 1 tablet daily.   ?Now having good bowel movements and MiraLAX discontinued . ?  ?Osteoarthritis of hip ?Patient had plans for arthroplasty on Monday, 3/13.  Discussed with patient.  Given now new onset atrial fibrillation, would favor postponing surgery for a few weeks. ?  ?   ?Body mass index is 30.41 kg/m?.  ?   ?  ?Consultants: ?Gastroenterology ?Cardiology ?  ?Procedures: ?flexible sigmoidoscopy 3/9 ?Echocardiogram: Ejection fraction of 45 to 50%.  Mildly dilated left atria ?  ?Antimicrobials: ?IV Zosyn 3/7 - 3/9 ?Augmentin 3/9 - 3/14 ? ?Diet: Heart healthy diet ?DVT Prophylaxis: Therapeutic Anticoagulation with Eliquis   ? ?Advance goals of care discussion: Full code ? ?Family Communication: family was not present at bedside, at the time  of interview.  ?The pt provided permission to discuss medical plan with the family. Opportunity was given to ask question and all questions were answered satisfactorily.  ? ?Disposition:  ?Pt is from Home, admitted with colitis GI bleeding and developed new A-fib with RVR, still has tachycardia, seen by cardiology, started on Eliquis on 3/15, which precludes a safe discharge. ?Discharge to home with home health PT, when clinically stable ?3/16 started amiodarone by cardiology and increased Lopressor ? ? ? ?Subjective: No significant overnight events, still patient with RVR but patient denies any symptoms no palpitations, no chest pain.  Patient denied any GI bleeding, and no abdominal pain. ? ? ?Physical Exam: ?General:  alert oriented to time, place, and person.  ?Appear in no distress, affect appropriate ?Eyes: PERRLA ?ENT: Oral Mucosa Clear, moist  ?Neck: no JVD,  ?Cardiovascular: S1 and S2 Present, no Murmur, irregular rhythm ?Respiratory: good respiratory effort, Bilateral Air entry equal and Decreased, no Crackles, no wheezes ?Abdomen: Bowel Sound present, Soft and no tenderness,  ?Skin: no rashes ?Extremities: no Pedal edema, no calf tenderness ?Neurologic: without any new focal findings ?Gait not checked due to patient safety concerns ? ?Vitals:  ? 07/01/21 0730 07/01/21 0734 07/01/21 1100 07/01/21 1230  ?BP:  112/88  96/63  ?Pulse: 71 72  78  ?Resp: 19  16 19   ?Temp: (!) 97.3 ?F (36.3 ?C)   97.7 ?F (36.5 ?  C)  ?TempSrc: Oral     ?SpO2: 96% 95%  97%  ?Weight:      ?Height:      ? ? ?Intake/Output Summary (Last 24 hours) at 07/01/2021 1452 ?Last data filed at 07/01/2021 1000 ?Gross per 24 hour  ?Intake 600 ml  ?Output 1350 ml  ?Net -750 ml  ? ?Filed Weights  ? 06/21/21 2033 06/24/21 0901  ?Weight: 90.7 kg 90.7 kg  ? ? ?Data Reviewed: ?I have personally reviewed and interpreted daily labs, tele strips, imagings as discussed above. ?I reviewed all nursing notes, pharmacy notes, vitals, pertinent old records ?I  have discussed plan of care as described above with RN and patient/family. ? ?CBC: ?Recent Labs  ?Lab 06/25/21 ?1497 06/26/21 ?0263 06/27/21 ?0411 06/28/21 ?7858 07/01/21 ?8502  ?WBC 11.3* 13.4* 11.5* 10.4 11.3*  ?H

## 2021-07-01 NOTE — Progress Notes (Signed)
PT Cancellation Note ? ?Patient Details ?Name: Cameron Foster. ?MRN: [id number] ?DOB: 03/02/1941 ? ? ?Cancelled Treatment:    Reason Eval/Treat Not Completed: Fatigue/lethargy limiting ability to participate. States he is very tired. Wants to wait until later to walk. Will return later as time allows.  ? ? ?[name] ?07/01/2021, 11:48 AM ?

## 2021-07-01 NOTE — Progress Notes (Signed)
Notified by pharmacy that Eliquis or Xarelto would be $571.77 per month due to a $505.00 deductible.  Will notify MD.  ?

## 2021-07-01 NOTE — Progress Notes (Signed)
Physical Therapy Treatment ?Patient Details ?Name: Cameron Foster. ?MRN: [id number] ?DOB: Aug 23, 1940 ?Today's Date: 07/01/2021 ? ? ?History of Present Illness Cameron Foster. is a 81 y.o. male with medical history of chronic alcohol use, paroxysmal A-fib not on anticoagulation, CVA, hypertension, history of upper GI bleed secondary to pyloric ulcer who presents with bright red blood per rectum. ? ?  ?PT Comments  ? ? Patient received in recliner. Declines ambulation, but wants assistance to get back into bed. Bed straightened and bed pad replaced with clean one. Patient needed min assist to stand from recliner and pivot over to bed. He reports he is tired today and his BP has been low. Just not feeling very good. Patient has been making good progress with therapy and will continue to benefit from PT for improved independence, strength and safety.  ?     ?Recommendations for follow up therapy are one component of a multi-disciplinary discharge planning process, led by the attending physician.  Recommendations may be updated based on patient status, additional functional criteria and insurance authorization. ? ?Follow Up Recommendations ? Home health PT ?  ?  ?Assistance Recommended at Discharge Intermittent Supervision/Assistance  ?Patient can return home with the following A little help with walking and/or transfers;A little help with bathing/dressing/bathroom;Assistance with cooking/housework;Assist for transportation;Help with stairs or ramp for entrance ?  ?Equipment Recommendations ? Rolling walker (2 wheels)  ?  ?Recommendations for Other Services   ? ? ?  ?Precautions / Restrictions Precautions ?Precautions: Fall ?Restrictions ?Weight Bearing Restrictions: No  ?  ? ?Mobility ? Bed Mobility ?Overal bed mobility: Independent ?Bed Mobility: Sit to Supine ?  ?  ?  ?Sit to supine: Independent ?  ?  ?  ? ?Transfers ?Overall transfer level: Needs assistance ?Equipment used: 1 person hand held assist ?Transfers:  Sit to/from Stand, Bed to chair/wheelchair/BSC ?Sit to Stand: Min assist ?Stand pivot transfers: Min assist ?  ?  ?  ?  ?  ?  ? ?Ambulation/Gait ?  ?  ?  ?  ?  ?  ?  ?General Gait Details: patient declined further mobility this date. Reports he is not feeling well today, BP is low, just wanted to get back into bed. ? ? ?Stairs ?  ?  ?  ?  ?  ? ? ?Wheelchair Mobility ?  ? ?Modified Rankin (Stroke Patients Only) ?  ? ? ?  ?Balance Overall balance assessment: Needs assistance ?Sitting-balance support: Feet supported ?Sitting balance-Leahy Scale: Good ?  ?  ?Standing balance support: Single extremity supported, Bilateral upper extremity supported, During functional activity ?Standing balance-Leahy Scale: Fair ?Standing balance comment: Can static stand and maintain balance without UE support. Benefits from at least single UE support. ?  ?  ?  ?  ?  ?  ?  ?  ?  ?  ?  ?  ? ?  ?Cognition Arousal/Alertness: Awake/alert ?Behavior During Therapy: [facility] for tasks assessed/performed ?Overall Cognitive Status: Within Functional Limits for tasks assessed ?  ?  ?  ?  ?  ?  ?  ?  ?  ?  ?  ?  ?  ?  ?  ?  ?  ?  ?  ? ?  ?Exercises   ? ?  ?General Comments   ?  ?  ? ?Pertinent Vitals/Pain Pain Assessment ?Pain Assessment: No/denies pain  ? ? ?Home Living   ?  ?  ?  ?  ?  ?  ?  ?  ?  ?   ?  ?  Prior Function    ?  ?  ?   ? ?PT Goals (current goals can now be found in the care plan section) Acute Rehab PT Goals ?Patient Stated Goal: to get stronger before going home ?PT Goal Formulation: With patient ?Time For Goal Achievement: 07/07/21 ?Potential to Achieve Goals: Good ?Progress towards PT goals: Not progressing toward goals - comment (not able to progress today due to not feeling well, tired.) ? ?  ?Frequency ? ? ? Min 2X/week ? ? ? ?  ?PT Plan Current plan remains appropriate  ? ? ?Co-evaluation   ?  ?  ?  ?  ? ?  ?AM-PAC PT "6 Clicks" Mobility   ?Outcome Measure ? Help needed turning from your back to your side while in a flat bed  without using bedrails?: None ?Help needed moving from lying on your back to sitting on the side of a flat bed without using bedrails?: A Little ?Help needed moving to and from a bed to a chair (including a wheelchair)?: A Little ?Help needed standing up from a chair using your arms (e.g., wheelchair or bedside chair)?: A Little ?Help needed to walk in hospital room?: A Little ?Help needed climbing 3-5 steps with a railing? : A Lot ?6 Click Score: 18 ? ?  ?End of Session   ?Activity Tolerance: Patient tolerated treatment well ?Patient left: in bed;with call bell/phone within reach;with bed alarm set ?Nurse Communication: Mobility status ?PT Visit Diagnosis: Unsteadiness on feet (R26.81);Difficulty in walking, not elsewhere classified (R26.2);Other abnormalities of gait and mobility (R26.89);Muscle weakness (generalized) (M62.81) ?  ? ? ?Time: 7915-0569 ?PT Time Calculation (min) (ACUTE ONLY): 12 min ? ?Charges:  $Therapeutic Activity: 8-22 mins          ?          ? ?Amanda Cockayne, PT, GCS ?07/01/21,2:18 PM ? ?

## 2021-07-01 NOTE — Progress Notes (Signed)
Assumed care of pt at 1900. Pt A&O x4. RA. No c/o pain overnight. Pt voiced understanding of being NPO at [facility] for potential cardioversion 3/16. HR maintaining 90-110s for most of the night. At approx 0600, HR sustaining 140s. Provider made aware, see new orders. Voiding in urinal. Ice pack applied to left arm to old IV infiltration site. Full assessment per flowsheets. Medication administration per MAR. Call bell within reach. Pt making needs known. Comfort and safety maintained.  ? ? ?

## 2021-07-01 NOTE — Progress Notes (Signed)
? ? ?Progress Note ? ?Patient Name: Cameron Foster. ?Date of Encounter: 07/01/2021 ? ?Primary Cardiologist: End ? ?Subjective  ? ?Started on Eliquis yesterday, seems to be tolerating so far. He remains in Afib with RVR with episodes of possible flutter with RVR into the 140s bpm as well. He was given IV Lopressor 5 mg this morning for Afib with RVR with ventricular rates in the 140s bpm. Episodes of intermittent dyspnea noted overnight. No chest pain or palpitations.  ? ?Inpatient Medications  ?  ?Scheduled Meds: ? apixaban  5 mg Oral BID  ? atorvastatin  40 mg Oral q1800  ? digoxin  0.125 mg Oral Daily  ? folic acid  1 mg Oral Daily  ? furosemide  40 mg Oral Daily  ? guaiFENesin  600 mg Oral BID  ? loratadine  10 mg Oral Daily  ? meclizine  25 mg Oral Daily  ? metoprolol tartrate  37.5 mg Oral Q6H  ? multivitamin with minerals  1 tablet Oral Daily  ? multivitamin with minerals  1 tablet Oral Daily  ? nicotine  21 mg Transdermal Daily  ? pantoprazole  40 mg Oral Daily  ? potassium chloride  40 mEq Oral BID  ? senna  1 tablet Oral Daily  ? sertraline  50 mg Oral Daily  ? sodium chloride flush  3 mL Intravenous Q12H  ? thiamine  100 mg Oral Daily  ? Or  ? thiamine  100 mg Intravenous Daily  ? vitamin B-12  1,000 mcg Oral Daily  ? ?Continuous Infusions: ? sodium chloride    ? ?PRN Meds: ?sodium chloride, acetaminophen **OR** acetaminophen, fluticasone, guaiFENesin-dextromethorphan, melatonin, morphine injection, nitroGLYCERIN, ondansetron **OR** ondansetron (ZOFRAN) IV, sodium chloride flush  ? ?Vital Signs  ?  ?Vitals:  ? 07/01/21 2947 07/01/21 0646 07/01/21 0730 07/01/21 0734  ?BP:  108/84  112/88  ?Pulse: (!) 112 89 71 72  ?Resp:   19   ?Temp:   (!) 97.3 ?F (36.3 ?C)   ?TempSrc:   Oral   ?SpO2:   96% 95%  ?Weight:      ?Height:      ? ? ?Intake/Output Summary (Last 24 hours) at 07/01/2021 0809 ?Last data filed at 07/01/2021 6546 ?Gross per 24 hour  ?Intake 963 ml  ?Output 1500 ml  ?Net -537 ml  ? ?Filed Weights  ?  06/21/21 2033 06/24/21 0901  ?Weight: 90.7 kg 90.7 kg  ? ? ?Telemetry  ?  ?Afib with RVR with ventricular rates in the low 100s bpm with episodes of regular narrow complex tachycardia into the 140s bpm consistent with flutter with RVR - Personally Reviewed ? ?ECG  ?  ?No new tracings - Personally Reviewed ? ?Physical Exam  ? ?GEN: No acute distress.   ?Neck: No JVD. ?Cardiac: Tachycardic, IRIR, no murmurs, rubs, or gallops.  ?Respiratory: Crackles along the bilateral bases, left > right.  ?GI: Soft, nontender, non-distended.   ?MS: No edema; No deformity. ?Neuro:  Alert and oriented x 3; Nonfocal.  ?Psych: Normal affect. ? ?Labs  ?  ?Chemistry ?Recent Labs  ?Lab 06/27/21 ?0411 06/28/21 ?0426 07/01/21 ?0432  ?NA 136 136 133*  ?K 3.6 3.9 4.8  ?CL 102 102 99  ?CO2 26 29 26   ?GLUCOSE 125* 128* 107*  ?BUN 7* 6* 16  ?CREATININE 1.13 1.12 1.40*  ?CALCIUM 8.6* 8.8* 9.0  ?GFRNONAA >60 >60 51*  ?ANIONGAP 8 5 8   ?  ? ?Hematology ?Recent Labs  ?Lab 06/27/21 ?0411 06/28/21 ?5035  07/01/21 ?0432  ?WBC 11.5* 10.4 11.3*  ?RBC 3.70* 3.66* 3.79*  ?HGB 10.7* 10.5* 10.8*  ?HCT 33.7* 33.3* 33.9*  ?MCV 91.1 91.0 89.4  ?MCH 28.9 28.7 28.5  ?MCHC 31.8 31.5 31.9  ?RDW 12.9 12.7 12.8  ?PLT 258 253 347  ? ? ?Cardiac EnzymesNo results for input(s): TROPONINI in the last 168 hours. No results for input(s): TROPIPOC in the last 168 hours.  ? ?BNP ?Recent Labs  ?Lab 06/25/21 ?1706 06/26/21 ?1835 06/28/21 ?0426  ?BNP 498.6* 662.7* 569.3*  ?  ? ?DDimer  ?Recent Labs  ?Lab 06/27/21 ?1610  ?DDIMER 9.33*  ?  ? ?Radiology  ?  ?No results found. ? ?Cardiac Studies  ? ?2D echo 06/26/2021: ?1. Left ventricular ejection fraction, by estimation, is 45 to 50%. The  ?left ventricle has mildly decreased function. The left ventricle  ?demonstrates global hypokinesis. There is moderate concentric left  ?ventricular hypertrophy. Left ventricular  ?diastolic parameters are indeterminate. Elevated left ventricular  ?end-diastolic pressure.  ? 2. Right ventricular  systolic function is normal. The right ventricular  ?size is normal. There is normal pulmonary artery systolic pressure.  ? 3. Left atrial size was mildly dilated.  ? 4. The mitral valve is normal in structure. Mild mitral valve  ?regurgitation. No evidence of mitral stenosis.  ? 5. The aortic valve is tricuspid. There is mild calcification of the  ?aortic valve. Aortic valve regurgitation is not visualized. No aortic  ?stenosis is present.  ? 6. Aortic dilatation noted. There is mild dilatation of the aortic root,  ?measuring 41 mm. There is mild dilatation of the ascending aorta,  ?measuring 39 mm.  ? 7. The inferior vena cava is dilated in size with <50% respiratory  ?variability, suggesting right atrial pressure of 15 mmHg. ?__________ ?  ?Zio patch 06/2019: ?The patient was monitored for 13 days, 19 hours. ?The predominant rhythm was sinus with an average rate of 74 bpm (range 54 to 120 bpm in sinus). ?Occasional PACs (~4% burden) and rare PVCs were noted. ?There were 125 episodes of paroxysmal supraventricular tachycardia lasting up to 17.5 seconds with a maximum rate of 187 bpm. ?No sustained arrhythmia or prolonged pause was identified. ?There were no patient triggered events. ?  ?Predominantly sinus rhythm with occasional PACs and rare PVCs.  Multiple episodes of PSVT noted, lasting up to 17.5 seconds. ?__________ ?  ?2D echo 06/09/2019: ?1. Left ventricular ejection fraction, by estimation, is 55 to 60%. The  ?left ventricle has normal function. The left ventricle has no regional  ?wall motion abnormalities. Left ventricular diastolic parameters were  ?normal.  ? 2. Right ventricular systolic function is normal. The right ventricular  ?size is normal.  ? 3. The mitral valve is normal in structure and function. Trivial mitral  ?valve regurgitation.  ? 4. The aortic valve is normal in structure and function. Aortic valve  ?regurgitation is not visualized. ?__________ ?  ?2D echo 03/29/2018: ?- Left ventricle:  The cavity size was normal. Systolic function was  ?  normal. The estimated ejection fraction was in the range of 60%  ?  to 65%. Wall motion was normal; there were no regional wall  ?  motion abnormalities. Left ventricular diastolic function  ?  parameters were normal.  ?- Aortic valve: There was mild regurgitation.  ?- Left atrium: The atrium was mildly dilated.  ?- Right ventricle: Systolic function was normal.  ?- Atrial septum: Suspected small patent foramen ovale by color flow  ?  Doppler. .  ?-  Pulmonary arteries: Systolic pressure was within the normal  ?  range. ?__________ ?  ?2D echo 10/05/2015: ?- Left ventricle: The cavity size was mildly dilated. There was  ?  mild concentric hypertrophy. Systolic function was normal. The  ?  estimated ejection fraction was in the range of 50% to 55%. Wall  ?  motion was normal; there were no regional wall motion  ?  abnormalities. Left ventricular diastolic function parameters  ?  were normal.  ?- Aortic valve: There was trivial regurgitation.  ?- Mitral valve: There was mild regurgitation.  ?- Left atrium: The atrium was mildly dilated. ?__________ ?  ?Lexiscan MPI 10/02/2015: ?There was no ST segment deviation noted during stress. ?No T wave inversion was noted during stress. ?The study is normal. ?This is a low risk study. ?The left ventricular ejection fraction is mildly decreased (45-54%). ?EF appears close to normal visually. recommend an echocardiogram ? ?Patient Profile  ?   ?81 y.o. male with history of lone Afib in 1995 not on [place], subarachnoid hemorrhage of uncertain etiology in 04/6071 complicated by small lacunar stroke in 7106 felt to be embolic, HTN, HLD, prostate cancer, chronic lower extremity edema related to lymphedema, and alcohol use who was admitted on 3/7 with hematochezia and is being seen today for the evaluation of Afib with RVR at the request of Dr. [place] Pink. ? ?Assessment & Plan  ?  ?1. Afib with RVR: ?-He remains in Afib with ventricular  rates difficult to control, ranging in the low 100s bpm with episodes of possible atrial flutter with RVR into the 140s bpm ?-Historically, he has not been maintained on [place] as an outpatient given lone epis

## 2021-07-01 NOTE — Progress Notes (Signed)
Cross Cover ?Dose of 5 mg IV metoprolol ordered for a fib with RVR rate sustaining 140's ?

## 2021-07-02 ENCOUNTER — Other Ambulatory Visit (HOSPITAL_COMMUNITY): Payer: Self-pay

## 2021-07-02 DIAGNOSIS — I4819 Other persistent atrial fibrillation: Secondary | ICD-10-CM

## 2021-07-02 DIAGNOSIS — K51511 Left sided colitis with rectal bleeding: Secondary | ICD-10-CM | POA: Diagnosis not present

## 2021-07-02 DIAGNOSIS — I4891 Unspecified atrial fibrillation: Secondary | ICD-10-CM

## 2021-07-02 MED ORDER — METOPROLOL TARTRATE 50 MG PO TABS
100.0000 mg | ORAL_TABLET | Freq: Two times a day (BID) | ORAL | Status: DC
  Administered 2021-07-02 – 2021-07-03 (×2): 100 mg via ORAL
  Filled 2021-07-02 (×2): qty 2

## 2021-07-02 NOTE — Progress Notes (Signed)
[facility] Progress Note ? ?Patient: Cameron Foster.    UXY:[id number]  DOA: 06/22/2021    ? ?Date of Service: the patient was seen and examined on 07/02/2021 ? ?No chief complaint on file. ? ?Brief hospital course: ?Taken from previous note ?81 year old male past medical history of alcoholism, atrial fibrillation previous GI bleed from pyloric ulcer as well as diverticulosis presented to the emergency room on 3/7 with complaints of rectal bleeding/bright red blood per rectum and hemoglobin noted to be 10.4 (baseline of 13.2).  Admitted to the hospitalist service & GI consulted. CT angiogram noted left-sided colitis felt to be infectious versus inflammatory. ?  ?Hemoglobin remained stable and actually improved to 11.1 x 3/10.  However, on early morning of 3/10, patient went into new onset rapid atrial fibrillation.  Heart rate improved although still not fully controlled.  Remaining in atrial fibrillation.  Echocardiogram done noted mild decrease ejection fraction of 45-50%.  Patient slightly volume overloaded and received IV Lasix.  On evening of 3/11, started going into persistent rapid atrial fibrillation, so patient started on Cardizem drip. ?  ?Patient had some chest discomfort on the early morning of 3/12.  Troponin unremarkable.  Chest pain resolved on its own without morphine or nitroglycerin.  EKG noted atrial fibrillation with right bundle branch block and left anterior fascicular block.  Both branch blocks are unchanged from previous EKGs prior to hospitalization. ?  ?Patient heart rate remains somewhat difficult to control which becomes normal at rest, but even with mild exertion starts to jump up above 100.  Cardiology consulted and following.  After discussion with cardiology and with patient, looking to start anticoagulation ? ? ? ?Assessment and Plan: ?* Left sided colitis ([place]) ?Ischemic versus infectious.  Given persistently elevated Gain[place] blood cell count, started on antibiotics on 3/8.   Hemoglobin remained stable.  Flexible sigmoidoscopy notes possible ischemic colitis.  Curley count normalized.  Completed antibiotics 3/14. ?  ?Paroxysmal atrial fibrillation ([place]) ?Patient states he had atrial fibrillation once before, back in the 90s.  Echocardiogram done in 2021 noted no signs of atrial enlargement at that time.  When patient was admitted during this hospitalization, was in normal sinus rhythm.  Now staying in atrial fibrillation. Echocardiogram notes mild systolic heart failure and mildly dilated atria which are new compared to echocardiogram from 2 years ago. ?  ? Have titrated up his Toprol-XL to 100 from 50 starting 3/11 morning.  Started on Cardizem drip 3/11 evening when he went into rapid atrial fibrillation.  Despite bolus and infusion at 10, still continued to have resting rapid ventricular rate so loaded with digoxin starting 3/12 morning.  Patient at times even with mild exertion start to have significantly elevated heart rate in the 120s.  Have consulted cardiology. ?  ?After discussion with cardiology and with patient, I think he needs to be a candidate for anticoagulation.  Patient did have a subarachnoid bleed with a hunt-hess score of 2 in June 2020.  He has had a bleeding ulcer in the somewhat recent past, but this hospitalization, had some rectal bleeding only from colitis.  That said, he is a very high risk for thromboembolism from this atrial fibrillation.  In review of some literature, for high risk candidates, they recommend patient can start back on anticoagulation within several weeks, so given that has been 3-1/2 years since his hemorrhage, would feel that benefit outweighs risk to start anticoagulation. ?3/15 started Eliquis 5 mg p.o. twice daily as per cardiology, continue  Lopressor 37.5 every 6 hourly and continue digoxin 0.125 mg daily ?Patient may need TEE/cardioversion,  ?3/16 started metoprolol 50 every 6 hourly and amiodarone 200 mg p.o. twice daily as per  cardiology ?3/17 patient was converted back to sinus rhythm on 07/01/2021 at 19:35 and have maintained sinus since.  ?Awaiting cardiology discussed with patient's sister regarding Eliquis arrangement at it will cost $571.77 ? ?Acute clinical systolic heart failure ([place]) ?Noted on echocardiogram.  Ejection fraction of 45 to 50%.  Mild decreased in systolic function when compared with echocardiogram 2 years ago.  Likely exacerbated by volume resuscitation following bleed.  Mild.  Received IV Lasix x2.  Now appears to be euvolemic. ?  ?Acute blood loss anemia ?Felt to be secondary to left-sided colitis.  Infectious favored over inflammatory.  Continue to monitor closely.  Cultures unremarkable.  Hemoglobin remained stable. ?  ?Essential hypertension ?Lisinopril was discontinued on 3/13 ?Continue Lopressor as above with holding parameters ?Blood pressure remains soft due to A-fib with RVR ? ?  ?Depression, major, single episode, mild ([place]) ?Continue Zoloft. ?  ?Alcohol use disorder, moderate, dependence ([place]) ?Patient with a history of alcohol use disorder ?On CIWA protocol and administer lorazepam for CIWA score of 8 or greater ?Patient on MVI, thiamine and folic acid ?  ?Obesity ?Meets criteria with BMI greater than 30 ?  ?Hyperlipidemia LDL goal <70 ?Continue statin ?  ?Constipation ?Related to opioid use for pain control ?We will start patient on MiraLAX 17 g daily as well as senna 1 tablet daily.   ?Now having good bowel movements and MiraLAX discontinued . ?  ?Osteoarthritis of hip ?Patient had plans for arthroplasty on Monday, 3/13.  Discussed with patient.  Given now new onset atrial fibrillation, would favor postponing surgery for a few weeks. ?  ?   ?Body mass index is 30.41 kg/m?.  ?   ?  ?Consultants: ?Gastroenterology ?Cardiology ?  ?Procedures: ?flexible sigmoidoscopy 3/9 ?Echocardiogram: Ejection fraction of 45 to 50%.  Mildly dilated left atria ?  ?Antimicrobials: ?IV Zosyn 3/7 - 3/9 ?Augmentin 3/9 -  3/14 ? ?Diet: Heart healthy diet ?DVT Prophylaxis: Therapeutic Anticoagulation with Eliquis   ? ?Advance goals of care discussion: Full code ? ?Family Communication: family was not present at bedside, at the time of interview.  ?The pt provided permission to discuss medical plan with the family. Opportunity was given to ask question and all questions were answered satisfactorily.  ? ?Disposition:  ?Pt is from Home, admitted with colitis GI bleeding and developed new A-fib with RVR, still has tachycardia, seen by cardiology, started on Eliquis on 3/15, still has A-fib with RVR, which precludes a safe discharge. ?Discharge to home with home health PT, when clinically stable ?3/16 started amiodarone by cardiology and increased Lopressor ?3/17 awaiting For arrangement of Eliquis at it will cost $ 571/month ? ? ?Subjective: No significant overnight events, patient was converted back to normal sinus rhythm, denied any symptoms, no chest pain or palpitations overnight.  Patient denied any GI bleeding, patient was complaining of some rectal pain due to hemorrhoids. ? ? ? ?Physical Exam: ?General:  alert oriented to time, place, and person.  ?Appear in no distress, affect appropriate ?Eyes: PERRLA ?ENT: Oral Mucosa Clear, moist  ?Neck: no JVD,  ?Cardiovascular: S1 and S2 Present, no Murmur, irregular rhythm ?Respiratory: good respiratory effort, Bilateral Air entry equal and Decreased, no Crackles, no wheezes ?Abdomen: Bowel Sound present, Soft and no tenderness,  ?Skin: no rashes ?Extremities: no Pedal edema, no calf  tenderness ?Neurologic: without any new focal findings ?Gait not checked due to patient safety concerns ? ?Vitals:  ? 07/02/21 0313 07/02/21 0800 07/02/21 0816 07/02/21 1215  ?BP: 113/72  135/78 113/69  ?Pulse: 71  71 66  ?Resp: 16 18 17 16   ?Temp: 97.9 ?F (36.6 ?C)  98.2 ?F (36.8 ?C) 98.3 ?F (36.8 ?C)  ?TempSrc:    Oral  ?SpO2: 97%  96% 95%  ?Weight:      ?Height:      ? ? ?Intake/Output Summary (Last 24  hours) at 07/02/2021 1419 ?Last data filed at 07/02/2021 1051 ?Gross per 24 hour  ?Intake 243 ml  ?Output 300 ml  ?Net -57 ml  ? ?Filed Weights  ? 06/21/21 2033 06/24/21 0901  ?Weight: 90.7 kg 90.7 kg  ? ? ?Data Reviewed:

## 2021-07-02 NOTE — Progress Notes (Signed)
Mobility Specialist - Progress Note ? ? ? 07/02/21 1600  ?Mobility  ?Activity Ambulated with assistance in hallway;Stood at bedside  ?Level of Assistance Standby assist, set-up cues, supervision of patient - no hands on  ?Assistive Device Front wheel walker  ?Distance Ambulated (ft) 160 ft  ?Activity Response Tolerated well  ?$Mobility charge 1 Mobility  ? ? ?During mobility: 82 HR, BP, 98% SpO2 ? ?Pt in bathroom upon arrival using RA. Pt ambulates 166f with supervison (no hands on) and returns to room. Pt stands at sink for about 1 minute indep to wash hands and completes bed mobility with supervision. Voices no complaints throughout. Left in bed with needs in reach, alarm set and family at bedside. ? ?MMerrily Brittle?Mobility Specialist ?07/02/21, 4:05 PM ? ? ? ? ?

## 2021-07-02 NOTE — Progress Notes (Signed)
? ? ?Progress Note ? ?Patient Name: Cameron Foster. ?Date of Encounter: 07/02/2021 ? ?Primary Cardiologist: End ? ?Subjective  ? ?Converted to sinus rhythm on 07/01/2021 at 19:35 and have maintained sinus since. No chest pain, dyspnea, palpitations, dizziness, presyncope, or syncope. He did not some "pink" on the toilet paper this morning and attributed this to hemorrhoids.  ? ?Inpatient Medications  ?  ?Scheduled Meds: ? amiodarone  200 mg Oral BID  ? apixaban  5 mg Oral BID  ? atorvastatin  40 mg Oral q1800  ? folic acid  1 mg Oral Daily  ? guaiFENesin  600 mg Oral BID  ? loratadine  10 mg Oral Daily  ? meclizine  25 mg Oral Daily  ? metoprolol tartrate  100 mg Oral BID  ? multivitamin with minerals  1 tablet Oral Daily  ? multivitamin with minerals  1 tablet Oral Daily  ? nicotine  21 mg Transdermal Daily  ? pantoprazole  40 mg Oral Daily  ? potassium chloride  40 mEq Oral BID  ? senna  1 tablet Oral Daily  ? sertraline  50 mg Oral Daily  ? sodium chloride flush  3 mL Intravenous Q12H  ? thiamine  100 mg Oral Daily  ? Or  ? thiamine  100 mg Intravenous Daily  ? vitamin B-12  1,000 mcg Oral Daily  ? ?Continuous Infusions: ? sodium chloride    ? ?PRN Meds: ?sodium chloride, acetaminophen **OR** acetaminophen, fluticasone, guaiFENesin-dextromethorphan, melatonin, morphine injection, nitroGLYCERIN, ondansetron **OR** ondansetron (ZOFRAN) IV, sodium chloride flush  ? ?Vital Signs  ?  ?Vitals:  ? 07/01/21 2254 07/02/21 0313 07/02/21 0800 07/02/21 0816  ?BP: 128/82 113/72  135/78  ?Pulse: 84 71  71  ?Resp: 14 16 18 17   ?Temp: 98.3 ?F (36.8 ?C) 97.9 ?F (36.6 ?C)  98.2 ?F (36.8 ?C)  ?TempSrc:      ?SpO2: 97% 97%  96%  ?Weight:      ?Height:      ? ? ?Intake/Output Summary (Last 24 hours) at 07/02/2021 1119 ?Last data filed at 07/02/2021 1051 ?Gross per 24 hour  ?Intake 243 ml  ?Output 300 ml  ?Net -57 ml  ? ?Filed Weights  ? 06/21/21 2033 06/24/21 0901  ?Weight: 90.7 kg 90.7 kg  ? ? ?Telemetry  ?  ?Converted to sinus  rhythm at 19:35 on 07/01/2021 and has maintained sinus since - Personally Reviewed ? ?ECG  ?  ?No new tracings - Personally Reviewed ? ?Physical Exam  ? ?GEN: No acute distress.   ?Neck: No JVD. ?Cardiac: RRR, no murmurs, rubs, or gallops.  ?Respiratory: Clear to auscultation bilaterally.  ?GI: Soft, nontender, non-distended.   ?MS: No edema; No deformity. ?Neuro:  Alert and oriented x 3; Nonfocal.  ?Psych: Normal affect. ? ?Labs  ?  ?Chemistry ?Recent Labs  ?Lab 06/27/21 ?0411 06/28/21 ?0426 07/01/21 ?0432  ?NA 136 136 133*  ?K 3.6 3.9 4.8  ?CL 102 102 99  ?CO2 26 29 26   ?GLUCOSE 125* 128* 107*  ?BUN 7* 6* 16  ?CREATININE 1.13 1.12 1.40*  ?CALCIUM 8.6* 8.8* 9.0  ?GFRNONAA >60 >60 51*  ?ANIONGAP 8 5 8   ?  ? ?Hematology ?Recent Labs  ?Lab 06/27/21 ?0411 06/28/21 ?0426 07/01/21 ?0432  ?WBC 11.5* 10.4 11.3*  ?RBC 3.70* 3.66* 3.79*  ?HGB 10.7* 10.5* 10.8*  ?HCT 33.7* 33.3* 33.9*  ?MCV 91.1 91.0 89.4  ?MCH 28.9 28.7 28.5  ?MCHC 31.8 31.5 31.9  ?RDW 12.9 12.7 12.8  ?PLT 258  253 347  ? ? ?Cardiac EnzymesNo results for input(s): TROPONINI in the last 168 hours. No results for input(s): TROPIPOC in the last 168 hours.  ? ?BNP ?Recent Labs  ?Lab 06/25/21 ?1706 06/26/21 ?1835 06/28/21 ?0426  ?BNP 498.6* 662.7* 569.3*  ?  ? ?DDimer  ?Recent Labs  ?Lab 06/27/21 ?5993  ?DDIMER 9.33*  ?  ? ?Radiology  ?  ?No results found. ? ?Cardiac Studies  ? ?2D echo 06/26/2021: ?1. Left ventricular ejection fraction, by estimation, is 45 to 50%. The  ?left ventricle has mildly decreased function. The left ventricle  ?demonstrates global hypokinesis. There is moderate concentric left  ?ventricular hypertrophy. Left ventricular  ?diastolic parameters are indeterminate. Elevated left ventricular  ?end-diastolic pressure.  ? 2. Right ventricular systolic function is normal. The right ventricular  ?size is normal. There is normal pulmonary artery systolic pressure.  ? 3. Left atrial size was mildly dilated.  ? 4. The mitral valve is normal in  structure. Mild mitral valve  ?regurgitation. No evidence of mitral stenosis.  ? 5. The aortic valve is tricuspid. There is mild calcification of the  ?aortic valve. Aortic valve regurgitation is not visualized. No aortic  ?stenosis is present.  ? 6. Aortic dilatation noted. There is mild dilatation of the aortic root,  ?measuring 41 mm. There is mild dilatation of the ascending aorta,  ?measuring 39 mm.  ? 7. The inferior vena cava is dilated in size with <50% respiratory  ?variability, suggesting right atrial pressure of 15 mmHg. ?__________ ?  ?Zio patch 06/2019: ?The patient was monitored for 13 days, 19 hours. ?The predominant rhythm was sinus with an average rate of 74 bpm (range 54 to 120 bpm in sinus). ?Occasional PACs (~4% burden) and rare PVCs were noted. ?There were 125 episodes of paroxysmal supraventricular tachycardia lasting up to 17.5 seconds with a maximum rate of 187 bpm. ?No sustained arrhythmia or prolonged pause was identified. ?There were no patient triggered events. ?  ?Predominantly sinus rhythm with occasional PACs and rare PVCs.  Multiple episodes of PSVT noted, lasting up to 17.5 seconds. ?__________ ?  ?2D echo 06/09/2019: ?1. Left ventricular ejection fraction, by estimation, is 55 to 60%. The  ?left ventricle has normal function. The left ventricle has no regional  ?wall motion abnormalities. Left ventricular diastolic parameters were  ?normal.  ? 2. Right ventricular systolic function is normal. The right ventricular  ?size is normal.  ? 3. The mitral valve is normal in structure and function. Trivial mitral  ?valve regurgitation.  ? 4. The aortic valve is normal in structure and function. Aortic valve  ?regurgitation is not visualized. ?__________ ?  ?2D echo 03/29/2018: ?- Left ventricle: The cavity size was normal. Systolic function was  ?  normal. The estimated ejection fraction was in the range of 60%  ?  to 65%. Wall motion was normal; there were no regional wall  ?  motion  abnormalities. Left ventricular diastolic function  ?  parameters were normal.  ?- Aortic valve: There was mild regurgitation.  ?- Left atrium: The atrium was mildly dilated.  ?- Right ventricle: Systolic function was normal.  ?- Atrial septum: Suspected small patent foramen ovale by color flow  ?  Doppler. .  ?- Pulmonary arteries: Systolic pressure was within the normal  ?  range. ?__________ ?  ?2D echo 10/05/2015: ?- Left ventricle: The cavity size was mildly dilated. There was  ?  mild concentric hypertrophy. Systolic function was normal. The  ?  estimated ejection fraction was in the range of 50% to 55%. Wall  ?  motion was normal; there were no regional wall motion  ?  abnormalities. Left ventricular diastolic function parameters  ?  were normal.  ?- Aortic valve: There was trivial regurgitation.  ?- Mitral valve: There was mild regurgitation.  ?- Left atrium: The atrium was mildly dilated. ?__________ ?  ?Lexiscan MPI 10/02/2015: ?There was no ST segment deviation noted during stress. ?No T wave inversion was noted during stress. ?The study is normal. ?This is a low risk study. ?The left ventricular ejection fraction is mildly decreased (45-54%). ?EF appears close to normal visually. recommend an echocardiogram ? ?Patient Profile  ?   ?81 y.o. male with history of lone Afib in 1995 not on [place], subarachnoid hemorrhage of uncertain etiology in 11/2421 complicated by small lacunar stroke in 5361 felt to be embolic, HTN, HLD, prostate cancer, chronic lower extremity edema related to lymphedema, and alcohol use who was admitted on 3/7 with hematochezia and is being seen today for the evaluation of Afib with RVR at the request of Dr. [place] Pink. ? ?Assessment & Plan  ?  ?1. Afib with RVR: ?-He converted to sinus rhythm on 07/01/2021 at 19:35 without significant posttermination pause, and has maintained sinus since ?-Consolidate Lopressor to 100 mg twice daily ?-For now, continue amiodarone 200 mg twice daily, though  would ideally hope this is not a long-term medication for him ?-With noted elevated digoxin level, and in the context of pharmacological cardioversion, no indication for further digoxin ?-Historically, he has not been [place]

## 2021-07-02 NOTE — Progress Notes (Signed)
Physical Therapy Treatment ?Patient Details ?Name: Cameron Foster. ?MRN: [id number] ?DOB: March 26, 1941 ?Today's Date: 07/02/2021 ? ? ?History of Present Illness Cameron Foster. is a 81 y.o. male with medical history of chronic alcohol use, paroxysmal A-fib not on anticoagulation, CVA, hypertension, history of upper GI bleed secondary to pyloric ulcer who presents with bright red blood per rectum. ? ?  ?PT Comments  ? ? Pt states he is feeling better today.  HR 64bpm, O2 sats 94% at rest, no c/o fatigue.  Pt able to demonstrate independent bed mobility, transfers from bed, commode, chair with supervision. Gait training with RW 185f with supervision and occasional vc's for upright posture and increased heel strike bilaterally. Pt educated on POC, exercises for LE's, and benefits of HHPT once medically cleared for d/c. Pt in agreement with plan and anxious to return home. Pt has received RW for d/c home. ?  ?Recommendations for follow up therapy are one component of a multi-disciplinary discharge planning process, led by the attending physician.  Recommendations may be updated based on patient status, additional functional criteria and insurance authorization. ? ?Follow Up Recommendations ? Home health PT ?  ?  ?Assistance Recommended at Discharge Intermittent Supervision/Assistance  ?Patient can return home with the following A little help with walking and/or transfers;A little help with bathing/dressing/bathroom;Assistance with cooking/housework;Assist for transportation;Help with stairs or ramp for entrance ?  ?Equipment Recommendations ? Rolling walker (2 wheels)  ?  ?Recommendations for Other Services   ? ? ?  ?Precautions / Restrictions Precautions ?Precautions: Fall ?Restrictions ?Weight Bearing Restrictions: No  ?  ? ?Mobility ? Bed Mobility ?Overal bed mobility: Independent ?Bed Mobility: Sit to Supine ?  ?  ?Supine to sit: Independent ?Sit to supine: Independent ?  ?  ?  ? ?Transfers ?Overall transfer  level: Needs assistance ?  ?Transfers: Sit to/from Stand, Bed to chair/wheelchair/BSC ?Sit to Stand: Supervision ?Stand pivot transfers: Supervision ?  ?  ?  ?  ?General transfer comment:  (Safe technique with transfers) ?  ? ?Ambulation/Gait ?Ambulation/Gait assistance: Supervision ?Gait Distance (Feet): 150 Feet ?Assistive device: Rolling walker (2 wheels) ?Gait Pattern/deviations: Step-through pattern, Decreased step length - right, Decreased step length - left, Trunk flexed ?Gait velocity: WNL ?  ?  ?General Gait Details:  (HR WNL before and after activity) ? ? ?Stairs ?  ?  ?  ?  ?  ? ? ?Wheelchair Mobility ?  ? ?Modified Rankin (Stroke Patients Only) ?  ? ? ?  ?Balance   ?  ?  ?  ?  ?  ?  ?  ?  ?  ?  ?  ?  ?  ?  ?  ?  ?  ?  ?  ? ?  ?Cognition   ?  ?  ?  ?  ?  ?  ?  ?  ?  ?  ?  ?  ?  ?  ?  ?  ?  ?  ?  ?  ?  ? ?  ?Exercises   ? ?  ?General Comments General comments (skin integrity, edema, etc.):  (Reviewed POC, strengthening exercises for B LE's, educated pt on upright posture during gait and increasing heel strike to prevent falls with good understanding) ?  ?  ? ?Pertinent Vitals/Pain Pain Assessment ?Pain Assessment: No/denies pain  ? ? ?Home Living   ?  ?  ?  ?  ?  ?  ?  ?  ?  ?   ?  ?  Prior Function    ?  ?  ?   ? ?PT Goals (current goals can now be found in the care plan section) Acute Rehab PT Goals ?Patient Stated Goal: to get stronger before going home ?Progress towards PT goals: Progressing toward goals ? ?  ?Frequency ? ? ? Min 2X/week ? ? ? ?  ?PT Plan Current plan remains appropriate  ? ? ?Co-evaluation   ?  ?  ?  ?  ? ?  ?AM-PAC PT "6 Clicks" Mobility   ?Outcome Measure ? Help needed turning from your back to your side while in a flat bed without using bedrails?: None ?Help needed moving from lying on your back to sitting on the side of a flat bed without using bedrails?: A Little ?Help needed moving to and from a bed to a chair (including a wheelchair)?: A Little ?Help needed standing up from a  chair using your arms (e.g., wheelchair or bedside chair)?: A Little ?Help needed to walk in hospital room?: A Little ?Help needed climbing 3-5 steps with a railing? : A Lot ?6 Click Score: 18 ? ?  ?End of Session   ?  ?  ?  ?PT Visit Diagnosis: Unsteadiness on feet (R26.81);Difficulty in walking, not elsewhere classified (R26.2);Other abnormalities of gait and mobility (R26.89);Muscle weakness (generalized) (M62.81) ?  ? ? ?Time: 7366-8159 ?PT Time Calculation (min) (ACUTE ONLY): 26 min ? ?Charges:  $Gait Training: 8-22 mins ?$Therapeutic Activity: 8-22 mins          ?          ?Mikel Cella, PTA ? ? ? ?Cameron Foster ?07/02/2021, 12:56 PM ? ?

## 2021-07-03 DIAGNOSIS — K51511 Left sided colitis with rectal bleeding: Secondary | ICD-10-CM | POA: Diagnosis not present

## 2021-07-03 DIAGNOSIS — E785 Hyperlipidemia, unspecified: Secondary | ICD-10-CM

## 2021-07-03 DIAGNOSIS — I420 Dilated cardiomyopathy: Secondary | ICD-10-CM

## 2021-07-03 LAB — CBC
HCT: 33.4 % — ABNORMAL LOW (ref 39.0–52.0)
Hemoglobin: 10.5 g/dL — ABNORMAL LOW (ref 13.0–17.0)
MCH: 28.8 pg (ref 26.0–34.0)
MCHC: 31.4 g/dL (ref 30.0–36.0)
MCV: 91.8 fL (ref 80.0–100.0)
Platelets: 377 10*3/uL (ref 150–400)
RBC: 3.64 MIL/uL — ABNORMAL LOW (ref 4.22–5.81)
RDW: 12.8 % (ref 11.5–15.5)
WBC: 10.4 10*3/uL (ref 4.0–10.5)
nRBC: 0 % (ref 0.0–0.2)

## 2021-07-03 LAB — BASIC METABOLIC PANEL
Anion gap: 8 (ref 5–15)
BUN: 20 mg/dL (ref 8–23)
CO2: 26 mmol/L (ref 22–32)
Calcium: 9 mg/dL (ref 8.9–10.3)
Chloride: 101 mmol/L (ref 98–111)
Creatinine, Ser: 1.56 mg/dL — ABNORMAL HIGH (ref 0.61–1.24)
GFR, Estimated: 45 mL/min — ABNORMAL LOW (ref >60)
Glucose, Bld: 98 mg/dL (ref 70–99)
Potassium: 4.6 mmol/L (ref 3.5–5.1)
Sodium: 135 mmol/L (ref 135–145)

## 2021-07-03 MED ORDER — BISACODYL 5 MG PO TBEC: 5.0000 mg | DELAYED_RELEASE_TABLET | Freq: Every day | ORAL | 0 refills | Status: AC | PRN

## 2021-07-03 MED ORDER — POLYETHYLENE GLYCOL 3350 17 G PO PACK: 17.0000 g | PACK | Freq: Every day | ORAL | 0 refills | Status: DC

## 2021-07-03 MED ORDER — THIAMINE HCL 100 MG PO TABS: 100.0000 mg | ORAL_TABLET | Freq: Every day | ORAL | 0 refills | Status: DC

## 2021-07-03 MED ORDER — HYDROCORTISONE ACETATE 25 MG RE SUPP: 25.0000 mg | Freq: Two times a day (BID) | RECTAL | 0 refills | Status: DC | PRN

## 2021-07-03 MED ORDER — APIXABAN 5 MG PO TABS: 5.0000 mg | ORAL_TABLET | Freq: Two times a day (BID) | ORAL | 0 refills | Status: DC

## 2021-07-03 MED ORDER — MELATONIN 5 MG PO TABS: 10.0000 mg | ORAL_TABLET | Freq: Every day | ORAL | 2 refills | Status: DC

## 2021-07-03 MED ORDER — FOLIC ACID 1 MG PO TABS: 1.0000 mg | ORAL_TABLET | Freq: Every day | ORAL | 2 refills | Status: AC

## 2021-07-03 MED ORDER — METOPROLOL TARTRATE 100 MG PO TABS: 100.0000 mg | ORAL_TABLET | Freq: Two times a day (BID) | ORAL | 2 refills | Status: DC

## 2021-07-03 MED ORDER — AMIODARONE HCL 200 MG PO TABS
200.0000 mg | ORAL_TABLET | Freq: Two times a day (BID) | ORAL | 0 refills | Status: DC
Start: 2021-07-03 — End: 2021-07-22

## 2021-07-03 NOTE — Progress Notes (Signed)
Patient is alert and oriented and vital signs are stable this shift ? ?Per off going nurse, physician has discussed the possibility of discharge today. ? ?A walker that was ordered is present in the room for this patient to take home with him at discharge.  ? ?Patient will also be given a 30 day supply of Eliquis at discharge. ? ? ?

## 2021-07-03 NOTE — Progress Notes (Signed)
? ? ?Progress Note ? ?Patient Name: Cameron Foster. ?Date of Encounter: 07/03/2021 ? ?Primary Cardiologist: End ? ?Subjective  ? ?He continues to maintain normal sinus rhythm.  He denies any chest pain, shortness of breath or palpitations this morning ? ?Inpatient Medications  ?  ?Scheduled Meds: ? amiodarone  200 mg Oral BID  ? apixaban  5 mg Oral BID  ? atorvastatin  40 mg Oral q1800  ? folic acid  1 mg Oral Daily  ? guaiFENesin  600 mg Oral BID  ? loratadine  10 mg Oral Daily  ? meclizine  25 mg Oral Daily  ? metoprolol tartrate  100 mg Oral BID  ? multivitamin with minerals  1 tablet Oral Daily  ? multivitamin with minerals  1 tablet Oral Daily  ? nicotine  21 mg Transdermal Daily  ? pantoprazole  40 mg Oral Daily  ? senna  1 tablet Oral Daily  ? sertraline  50 mg Oral Daily  ? thiamine  100 mg Oral Daily  ? Or  ? thiamine  100 mg Intravenous Daily  ? vitamin B-12  1,000 mcg Oral Daily  ? ?Continuous Infusions: ? ? ?PRN Meds: ?acetaminophen **OR** acetaminophen, fluticasone, guaiFENesin-dextromethorphan, melatonin, morphine injection, nitroGLYCERIN, ondansetron **OR** ondansetron (ZOFRAN) IV  ? ?Vital Signs  ?  ?Vitals:  ? 07/02/21 2000 07/03/21 0040 07/03/21 0430 07/03/21 0809  ?BP: 137/80 129/83 134/81 (!) 144/74  ?Pulse: 77 77 72 67  ?Resp: 17 18 16 18   ?Temp: 98.3 ?F (36.8 ?C) 98.2 ?F (36.8 ?C) 98.1 ?F (36.7 ?C) 98 ?F (36.7 ?C)  ?TempSrc: Oral Oral Oral Oral  ?SpO2: 97% 97% 95% 96%  ?Weight:      ?Height:      ? ? ?Intake/Output Summary (Last 24 hours) at 07/03/2021 1021 ?Last data filed at 07/03/2021 0809 ?Gross per 24 hour  ?Intake 480 ml  ?Output 1700 ml  ?Net -1220 ml  ? ? ?Filed Weights  ? 06/21/21 2033 06/24/21 0901  ?Weight: 90.7 kg 90.7 kg  ? ? ?Telemetry  ?Normal sinus rhythm- Personally Reviewed ? ?ECG  ?  ?No new tracings - Personally Reviewed ? ?Physical Exam  ? ?GEN: Well nourished, well developed in no acute distress ?HEENT: Normal ?NECK: No JVD; No carotid bruits ?LYMPHATICS: No  lymphadenopathy ?CARDIAC:RRR, no murmurs, rubs, gallops ?RESPIRATORY:  Clear to auscultation without rales, wheezing or rhonchi  ?ABDOMEN: Soft, non-tender, non-distended ?MUSCULOSKELETAL:  No edema; No deformity  ?SKIN: Warm and dry ?NEUROLOGIC:  Alert and oriented x 3 ?PSYCHIATRIC:  Normal affect 45-50 ?Labs  ?  ?Chemistry ?Recent Labs  ?Lab 06/28/21 ?0426 07/01/21 ?0432 07/03/21 ?8413  ?NA 136 133* 135  ?K 3.9 4.8 4.6  ?CL 102 99 101  ?CO2 29 26 26   ?GLUCOSE 128* 107* 98  ?BUN 6* 16 20  ?CREATININE 1.12 1.40* 1.56*  ?CALCIUM 8.8* 9.0 9.0  ?GFRNONAA >60 51* 45*  ?ANIONGAP 5 8 8   ? ?  ? ?Hematology ?Recent Labs  ?Lab 06/28/21 ?0426 07/01/21 ?0432 07/03/21 ?2440  ?WBC 10.4 11.3* 10.4  ?RBC 3.66* 3.79* 3.64*  ?HGB 10.5* 10.8* 10.5*  ?HCT 33.3* 33.9* 33.4*  ?MCV 91.0 89.4 91.8  ?MCH 28.7 28.5 28.8  ?MCHC 31.5 31.9 31.4  ?RDW 12.7 12.8 12.8  ?PLT 253 347 377  ? ? ? ?Cardiac EnzymesNo results for input(s): TROPONINI in the last 168 hours. No results for input(s): TROPIPOC in the last 168 hours.  ? ?BNP ?Recent Labs  ?Lab 06/26/21 ?1835 06/28/21 ?0426  ?  BNP 662.7* 569.3*  ? ?  ? ?DDimer  ?Recent Labs  ?Lab 06/27/21 ?4010  ?DDIMER 9.33*  ? ?  ? ?Radiology  ?  ?No results found. ? ?Cardiac Studies  ? ?2D echo 06/26/2021: ?1. Left ventricular ejection fraction, by estimation, is 45 to 50%. The  ?left ventricle has mildly decreased function. The left ventricle  ?demonstrates global hypokinesis. There is moderate concentric left  ?ventricular hypertrophy. Left ventricular  ?diastolic parameters are indeterminate. Elevated left ventricular  ?end-diastolic pressure.  ? 2. Right ventricular systolic function is normal. The right ventricular  ?size is normal. There is normal pulmonary artery systolic pressure.  ? 3. Left atrial size was mildly dilated.  ? 4. The mitral valve is normal in structure. Mild mitral valve  ?regurgitation. No evidence of mitral stenosis.  ? 5. The aortic valve is tricuspid. There is mild calcification of  the  ?aortic valve. Aortic valve regurgitation is not visualized. No aortic  ?stenosis is present.  ? 6. Aortic dilatation noted. There is mild dilatation of the aortic root,  ?measuring 41 mm. There is mild dilatation of the ascending aorta,  ?measuring 39 mm.  ? 7. The inferior vena cava is dilated in size with <50% respiratory  ?variability, suggesting right atrial pressure of 15 mmHg. ?__________ ?  ?Zio patch 06/2019: ?The patient was monitored for 13 days, 19 hours. ?The predominant rhythm was sinus with an average rate of 74 bpm (range 54 to 120 bpm in sinus). ?Occasional PACs (~4% burden) and rare PVCs were noted. ?There were 125 episodes of paroxysmal supraventricular tachycardia lasting up to 17.5 seconds with a maximum rate of 187 bpm. ?No sustained arrhythmia or prolonged pause was identified. ?There were no patient triggered events. ?  ?Predominantly sinus rhythm with occasional PACs and rare PVCs.  Multiple episodes of PSVT noted, lasting up to 17.5 seconds. ?__________ ?  ?2D echo 06/09/2019: ?1. Left ventricular ejection fraction, by estimation, is 55 to 60%. The  ?left ventricle has normal function. The left ventricle has no regional  ?wall motion abnormalities. Left ventricular diastolic parameters were  ?normal.  ? 2. Right ventricular systolic function is normal. The right ventricular  ?size is normal.  ? 3. The mitral valve is normal in structure and function. Trivial mitral  ?valve regurgitation.  ? 4. The aortic valve is normal in structure and function. Aortic valve  ?regurgitation is not visualized. ?__________ ?  ?2D echo 03/29/2018: ?- Left ventricle: The cavity size was normal. Systolic function was  ?  normal. The estimated ejection fraction was in the range of 60%  ?  to 65%. Wall motion was normal; there were no regional wall  ?  motion abnormalities. Left ventricular diastolic function  ?  parameters were normal.  ?- Aortic valve: There was mild regurgitation.  ?- Left atrium: The  atrium was mildly dilated.  ?- Right ventricle: Systolic function was normal.  ?- Atrial septum: Suspected small patent foramen ovale by color flow  ?  Doppler. .  ?- Pulmonary arteries: Systolic pressure was within the normal  ?  range. ?__________ ?  ?2D echo 10/05/2015: ?- Left ventricle: The cavity size was mildly dilated. There was  ?  mild concentric hypertrophy. Systolic function was normal. The  ?  estimated ejection fraction was in the range of 50% to 55%. Wall  ?  motion was normal; there were no regional wall motion  ?  abnormalities. Left ventricular diastolic function parameters  ?  were normal.  ?-  Aortic valve: There was trivial regurgitation.  ?- Mitral valve: There was mild regurgitation.  ?- Left atrium: The atrium was mildly dilated. ?__________ ?  ?Lexiscan MPI 10/02/2015: ?There was no ST segment deviation noted during stress. ?No T wave inversion was noted during stress. ?The study is normal. ?This is a low risk study. ?The left ventricular ejection fraction is mildly decreased (45-54%). ?EF appears close to normal visually. recommend an echocardiogram ? ?Patient Profile  ?   ?81 y.o. male with history of lone Afib in 1995 not on [place], subarachnoid hemorrhage of uncertain etiology in 06/3742 complicated by small lacunar stroke in 5146 felt to be embolic, HTN, HLD, prostate cancer, chronic lower extremity edema related to lymphedema, and alcohol use who was admitted on 3/7 with hematochezia and is being seen today for the evaluation of Afib with RVR at the request of Dr. [place] Pink. ? ?Assessment & Plan  ?  ?1. Afib with RVR: ?-He converted to sinus rhythm on 07/01/2021 at 19:35 without significant posttermination pause, and has maintained sinus since ?-Digoxin level was elevated and discontinued as he was maintaining normal sinus rhythm after conversion ?-Historically, he has not been maintained on [facility] as an outpatient given lone episode in 1995 without documented recurrence and in the context of  unprovoked SAH ?-Upon cardiology reviewing data, and following discussion between cardiology and patient, he was started on Eliquis on 06/30/2021 ?-Consider LAA occluder device as an outpatient ?-CHADS2VASc 7 (CHF, HTN, ag

## 2021-07-03 NOTE — Discharge Summary (Signed)
[facility] Discharge Summary ? ? ?Patient: Cameron Foster. TSV:[id number]  PCP: [name], MD  ?Date of admission: 06/22/2021   Date of discharge:  07/03/2021   ?  ?Discharge Diagnoses:  ?Principal Problem: ?  Left sided colitis ([place]) ?Active Problems: ?  Paroxysmal atrial fibrillation ([place]) ?  Acute clinical systolic heart failure ([place]) ?  Acute blood loss anemia ?  Essential hypertension ?  Depression, major, single episode, mild ([place]) ?  Alcohol use disorder, moderate, dependence ([place]) ?  Obesity ?  Hyperlipidemia LDL goal <70 ?  Constipation ?  Osteoarthritis of hip ?  Atrial fibrillation with rapid ventricular response ([place]) ?  Persistent atrial fibrillation ([place]) ? ? ?Admitted From: Home ?Disposition:  Home  ? ?Recommendations for Outpatient Follow-up:  ?PCP: In 1 week ?Follow-up with GI in few weeks ?Follow-up with cardiology in few weeks ?Follow up LABS/TEST: CBC and BMP in 1 week ? ? ?Diet recommendation: Cardiac diet ? ?Activity: The patient is advised to gradually reintroduce usual activities, as tolerated ? ?Discharge Condition: stable ? ?Code Status: Full code  ? ?History of present illness: As per the H and P dictated on admission ?Hospital Course:  ?Taken from previous note ?81 year old male past medical history of alcoholism, atrial fibrillation previous GI bleed from pyloric ulcer as well as diverticulosis presented to the emergency room on 3/7 with complaints of rectal bleeding/bright red blood per rectum and hemoglobin noted to be 10.4 (baseline of 13.2).  Admitted to the hospitalist service & GI consulted. CT angiogram noted left-sided colitis felt to be infectious versus inflammatory. ?Hemoglobin remained stable and actually improved to 11.1 x 3/10.  However, on early morning of 3/10, patient went into new onset rapid atrial fibrillation.  Heart rate improved although still not fully controlled.  Remaining in atrial fibrillation.  Echocardiogram done noted mild decrease  ejection fraction of 45-50%.  Patient slightly volume overloaded and received IV Lasix.  On evening of 3/11, started going into persistent rapid atrial fibrillation, so patient started on Cardizem drip. ?Patient had some chest discomfort on the early morning of 3/12.  Troponin unremarkable.  Chest pain resolved on its own without morphine or nitroglycerin.  EKG noted atrial fibrillation with right bundle branch block and left anterior fascicular block.  Both branch blocks are unchanged from previous EKGs prior to hospitalization. ?Patient heart rate remains somewhat difficult to control which becomes normal at rest, but even with mild exertion starts to jump up above 100.  Cardiology consulted and following. After discussion with cardiology and with patient, looking to start anticoagulation ? ?Patient was admitted on 06/22/2021, I started taking care of this patient on 06/30/2021.  Please review prior detailed notes and consultations.  Further management as below. ?Assessment and Plan: ?* Left sided colitis, Ischemic versus infectious.  Given persistently elevated Stille blood cell count, pt was started on antibiotics on 3/8.  Hemoglobin remained stable.  Flexible sigmoidoscopy notes possible ischemic colitis.  Sanfilippo count normalized.  Completed antibiotics on 3/14. ?Paroxysmal atrial fibrillation ?Patient states he had atrial fibrillation once before, back in the 90s.  Echocardiogram done in 2021 noted no signs of atrial enlargement at that time.  When patient was admitted during this hospitalization, was in normal sinus rhythm.  Now staying in atrial fibrillation. Echocardiogram notes mild systolic heart failure and mildly dilated atria which are new compared to echocardiogram from 2 years ago. ?Have titrated up his Toprol-XL to 100 from 50 starting 3/11 morning.  s/p Cardizem drip 3/11  evening when he went into rapid atrial fibrillation.  Despite bolus and infusion at 10, still continued to have resting rapid  ventricular rate so loaded with digoxin starting 3/12 morning.  Patient at times even with mild exertion start to have significantly elevated heart rate in the 120s. consulted cardiology. ?After discussion with cardiology and with patient, I think he needs to be a candidate for anticoagulation.  Patient did have a subarachnoid bleed with a hunt-hess score of 2 in June 2020.  He has had a bleeding ulcer in the somewhat recent past, but this hospitalization, had some rectal bleeding only from colitis.  That said, he is a very high risk for thromboembolism from this atrial fibrillation.  In review of some literature, for high risk candidates, they recommend patient can start back on anticoagulation within several weeks, so given that has been 3-1/2 years since his hemorrhage, would feel that benefit outweighs risk to start anticoagulation. ?3/15 started Eliquis 5 mg p.o. twice daily as per cardiology, continue Lopressor 37.5 every 6 hourly and c/d'd digoxin 0.125 mg daily. On 3/16 started metoprolol 50 every 6 hourly and amiodarone 200 mg p.o. twice daily as per cardiology ?3/17 patient was converted back to sinus rhythm on 07/01/2021 at 19:35 and have maintained sinus since.  TOC was consulted to arrange coupon of Eliquis and help him for anticoagulation form for long-term coverage.  Patient was advised to follow-up with PCP, repeat CBC in 1 week and follow with cardiology in few weeks.  Risk and benefit of anticoagulation was explained to patient, advised to return back to ED if any signs of bleeding ?  ?AKI most likely due to dehydration, patient was advised to continue oral hydration, water intake 1.5 L/day.  Creatinine 1.56 slightly elevated, patient was advised to stay 1 more day to check creatinine tomorrow a.m. but he wanted to go home.  Patient was discharged, recommended to repeat BMP next week and follow with PCP ? ?Acute clinical systolic heart failure ?Noted on echocardiogram.  Ejection fraction of 45 to  50%.  Mild decreased in systolic function when compared with echocardiogram 2 years ago.  Likely exacerbated by volume resuscitation following bleed. Received IV Lasix x2.  Now appears to be euvolemic.  Discontinued Lasix on discharge as creatinine is still elevated ?  ?Acute blood loss anemia, Felt to be secondary to left-sided colitis.  Infectious favored over inflammatory.  Bleeding resolved and hemoglobin remained stable. ?Essential hypertension, Lisinopril was discontinued on 3/13, Continue Lopressor.  Monitor BP at home ?Depression,Continue Zoloft. ?Alcohol use disorder, moderate, dependence, Patient with a history of alcohol use disorder ?S/p CIWA protocol, no withdrawal symptoms during hospital stay.  Patient was on thiamine and folic acid and multivitamin  ?Obesity, Meets criteria with BMI greater than 30 ?Hyperlipidemia LDL goal <70, Continue statin ?Constipation, Related to opioid use for pain control, started MiraLAX 17 g daily and Dulcolax as needed ?Osteoarthritis of hip ?Patient had plans for arthroplasty on Monday, 3/13.  Discussed with patient.  Given now new onset atrial fibrillation, would favor postponing surgery for a few weeks.  Follow with orthopedic surgery as an outpatient    ?   ?Antimicrobials: ?IV Zosyn 3/7 - 3/9 ?Augmentin 3/9 - 3/14 ? ?Body mass index is 30.41 kg/m?Marland Kitchen  ?Nutrition Interventions: ?  ?Patient was ambulatory without any assistance. ?On the day of the discharge the patient's vitals were stable, and no other acute medical condition were reported by patient. the patient was felt safe to be discharge at  Home. ? ?Consultants: Cardiology and GI ?Procedures: lexible sigmoidoscopy 3/9 ?Echocardiogram: Ejection fraction of 45 to 50%.  Mildly dilated left atria ? ?Discharge Exam: ?General: Appear in no distress, no Rash; Oral Mucosa Clear, moist. ?Cardiovascular: S1 and S2 Present, no Murmur, ?Respiratory: normal respiratory effort, Bilateral Air entry present and no Crackles, no  wheezes ?Abdomen: Bowel Sound present, Soft and no tenderness, no hernia ?Extremities: no Pedal edema, no calf tenderness ?Neurology: alert and oriented to time, place, and person ?affect appropriate. ? ?Filed Weights  ?

## 2021-07-03 NOTE — TOC CM/SW Note (Signed)
Eliquis coupon printed to unit. ? Oleh Genin, LCSW ?[phone] ? ?

## 2021-07-04 ENCOUNTER — Encounter: Payer: Self-pay | Admitting: Internal Medicine

## 2021-07-04 ENCOUNTER — Other Ambulatory Visit: Payer: Self-pay | Admitting: Student

## 2021-07-04 DIAGNOSIS — R5381 Other malaise: Secondary | ICD-10-CM

## 2021-07-04 NOTE — TOC CM/SW Note (Signed)
Call from [employer] Patient was DC from [facility]A yesterday and needed HHPT and RN. ?Asked Attending Dr. Dwyane Dee who stated he will put in orders.  ? Oleh Genin, LCSW ?[phone] ? ?

## 2021-07-04 NOTE — Progress Notes (Unsigned)
{  Select_TRH_Note:[id number]} 

## 2021-07-05 ENCOUNTER — Telehealth: Payer: Self-pay | Admitting: Emergency Medicine

## 2021-07-05 ENCOUNTER — Telehealth: Payer: Self-pay

## 2021-07-05 ENCOUNTER — Telehealth: Payer: PRIVATE HEALTH INSURANCE

## 2021-07-05 NOTE — Telephone Encounter (Signed)
Copied from [place] [id number]. Topic: Appointment Scheduling - Scheduling Inquiry for Clinic ?>> Jul 05, 2021 12:19 PM Greggory Keen D wrote: ?Pt called asking if the office will call him before his my chart visit tomorrow.  His number is [phone] ?

## 2021-07-05 NOTE — Telephone Encounter (Signed)
Spoke with patient.  ? ?Patient needed clarification on medications that he is supposed to be taking and why after hospital discharge. Cardiac medications reviewed with patient. Patient had questions about other medications and if he needed to continue to take them, told patient that he needed to reach out to his PCP to ask about those.  ? ?Patient also wanted to know when he is going to see Dr. Saunders Revel. Per discharge summary, patient is to follow up with cardiology in a few weeks, will verify with Dr. Saunders Revel when he would like for patient to come in. Explained to patient that someone from scheduling would reach out to him to schedule his follow up appointment.  ? ?Pt was under the impression that he was supposed to have weekly lab work after speaking with Dr. Saunders Revel in the hospital. Per last progress note from Dr. Saunders Revel, he wanted patient to have a CBC and BMP the next day, which was day of discharge. These labs were completed. Explained this to patient.  ? ?Patient verbalized understanding of everything discussed. Voiced appreciation for call.  ?

## 2021-07-05 NOTE — Telephone Encounter (Signed)
We do ?

## 2021-07-05 NOTE — Telephone Encounter (Signed)
Transition Care Management Follow-up Telephone Call ?Date of discharge and from where: 07/03/21 [facility] ?How have you been since you were released from the hospital? [place], weak ?Any questions or concerns? No ? ?Items Reviewed: ?Did the pt receive and understand the discharge instructions provided? Yes  ?Medications obtained and verified? Yes  ?Other? No  ?Any new allergies since your discharge? No  ?Dietary orders reviewed? No ?Do you have support at home? Yes  ? ?Home Care and Equipment/Supplies: ?Were home health services ordered? yes ?If so, what is the name of the agency? He doesn't remember  ?Has the agency set up a time to come to the patient's home? Yes, today ? ?Functional Questionnaire: (I = Independent and D = Dependent) ?ADLs: I ? ?Bathing/Dressing- I ? ?Meal Prep- I ? ?Eating- I ? ?Maintaining continence- I ? ?Transferring/Ambulation- I ? ?Managing Meds- I ? ?Follow up appointments reviewed: ? ?PCP Hospital f/u appt confirmed? Yes  Scheduled to see Dr.Bacigalupo on 3/24 @ 11am telephonic visit.Marland Kitchen ?Are transportation arrangements needed? No  ?If their condition worsens, is the pt aware to call PCP or go to the Emergency Dept.? Yes ?Was the patient provided with contact information for the PCP's office or ED? Yes ?Was to pt encouraged to call back with questions or concerns? Yes  ?

## 2021-07-05 NOTE — Telephone Encounter (Signed)
It sounds like Cameron Foster is confused about his medications, as we spoke at length about anticoagulation with apixaban versus warfarin.  As he is not on warfarin, he will not need weekly labs.  Please have him f/u with me or an APP in 1-2 weeks.  In the meantime, he should continue his current medications, particularly amiodarone, metoprolol, and apixaban.  He should complete the patient assistance forms for apixaban at his earliest convenience.  No labs are needed right now, though it may be worthwhile to recheck a BMP when he is seen in follow-up. ? ?Cameron Bush, MD ?[facility] ? ?

## 2021-07-05 NOTE — Progress Notes (Signed)
Patient requesting to speak with the clinical pharmacist regarding medication changes. ? ?Patient schedule a telephone appointment with the clinical pharmacist on 07/06/2021 at 11:00 am.Patient is aware he has appointment with another provider at 1:20 pm, but would like to speak with the clinical pharmacist as well. ? ?Anderson Malta ?Clinical Pharmacist Assistant ?[phone]  ? ?

## 2021-07-05 NOTE — Telephone Encounter (Signed)
Attempted to schedule.  LMOV to call office.  ° °

## 2021-07-06 ENCOUNTER — Telehealth (INDEPENDENT_AMBULATORY_CARE_PROVIDER_SITE_OTHER): Payer: Medicare Other | Admitting: Physician Assistant

## 2021-07-06 ENCOUNTER — Other Ambulatory Visit: Payer: Self-pay

## 2021-07-06 ENCOUNTER — Ambulatory Visit (INDEPENDENT_AMBULATORY_CARE_PROVIDER_SITE_OTHER): Payer: Medicare Other

## 2021-07-06 DIAGNOSIS — F320 Major depressive disorder, single episode, mild: Secondary | ICD-10-CM

## 2021-07-06 DIAGNOSIS — I480 Paroxysmal atrial fibrillation: Secondary | ICD-10-CM

## 2021-07-06 DIAGNOSIS — I10 Essential (primary) hypertension: Secondary | ICD-10-CM

## 2021-07-06 NOTE — Patient Instructions (Addendum)
Visit Information ?It was great speaking with you today!  Please let me know if you have any questions about our visit. ? ? Goals Addressed   ? ?  ?  ?  ?  ? This Visit's Progress  ?  Manage My Medicine   On track  ?  Timeframe:  Long-Range Goal ?Priority:  High ?Start Date: 02/25/2021                            ?Expected End Date: 02/25/2022                     ? ?Follow Up within 90 days ?  ?- call for medicine refill 2 or 3 days before it runs out ?- keep a list of all the medicines I take; vitamins and herbals too ?- use a pillbox to sort medicine  ?  ?Why is this important?   ?These steps will help you keep on track with your medicines. ?  ?Notes:  ?  ? ?  ? ? ?Patient Care Plan: General Pharmacy (Adult)  ?  ? ?Problem Identified: Hypertension, Hyperlipidemia, Atrial Fibrillation, Chronic Kidney Disease, Depression, Osteoarthritis, and History of TIA, CVA   ?Priority: High  ?  ? ?Long-Range Goal: Patient-Specific Goal   ?Start Date: 02/25/2021  ?Expected End Date: 02/25/2022  ?This Visit's Progress: On track  ?Recent Progress: On track  ?Priority: High  ?Note:   ?Current Barriers:  ?Unable to self administer medications as prescribed ? ?Pharmacist Clinical Goal(s):  ?Patient will maintain control of blood pressure as evidenced by BP less than 140/90  through collaboration with PharmD and provider.  ? ?Interventions: ?1:1 collaboration with Brita Romp, Dionne Bucy, MD regarding development and update of comprehensive plan of care as evidenced by provider attestation and co-signature ?Inter-disciplinary care team collaboration (see longitudinal plan of care) ?Comprehensive medication review performed; medication list updated in electronic medical record ? ?Hypertension (BP goal <140/90) ?-Controlled ?-Current treatment: ?Metoprolol tartrate 100 mg twice daily: Appropriate, Effective, Safe, Accessible  ?-Medications previously tried: Atenolol, Bisoprolol, nebivolol, amlodipine, lisinopril  ?-Current home readings:  120/80, 133/83 ?-Current dietary habits: Eats what I want to eat. 1 cup of coffee. Ocassional sweet tea or regular soda. Eats out large meal once weekly. Does salt food.  ?-Current exercise habits: None ?-Denies hypotensive/hypertensive symptoms, but does not significant fatigue throughout the day.  ?-Consider restarting lisinopril once AKI resolves for BP/renal protection.  ?-Recommended to continue current medication ? ?Hyperlipidemia: (LDL goal < 70) ?-Uncontrolled ?-History of TIA, CVA  ?-Current treatment: ?Atorvastatin 40 mg daily (AM) ?-Medications previously tried: NA  ?-Recommended to continue current medication ? ?Atrial Fibrillation (Goal: prevent stroke and major bleeding) ?-Controlled ?-CHADSVASC: 5 ?-Current treatment: ?Rate control:  ?Amiodarone 200 mg twice daily: Appropriate, Effective, Safe, Accessible ?Metoprolol tartrate 100 mg twice daily: Appropriate, Effective, Safe, Accessible  ?Anticoagulation: Eliquis 5 mg twice daily: Appropriate, Effective, Safe, Accessible  ?-Medications previously tried: NA ?-Medically converted, no A-Fib symptoms since discharged.  ?-Patient feels straining constipation may have precipitated symptoms.  ?-Recommended to continue current medication ? ?Depression (Goal: Maintain symptom remission) ?-Controlled ?-Current treatment: ?Sertraline 50 mg daily  ?-Medications previously tried/failed: NA ?-PHQ9: 3 ?-Recommended to continue current medication ? ?GERD (Goal: Prevent Reflux) ?-Controlled ?History of stomach ulcer  ?-Current treatment  ?Pantoprazole 40 mg twice daily: Query Appropriate  ?-Medications previously tried: NA  ?-Query appropriate use of twice daily pantoprazole, defer potential changes for now.  ?-Recommended to  continue current medication ? ?Chronic Kidney Disease Stage 3a  ?-All medications assessed for renal dosing and appropriateness in chronic kidney disease. ?-Recommended to continue current medication ? ?Patient Goals/Self-Care Activities ?Patient  will:  ?- check blood pressure weekly, document, and provide at future appointments ? ?Follow Up Plan: Telephone follow up appointment with care management team member scheduled for:  08/17/2021 at 11:00 AM ?  ? ?Patient agreed to services and verbal consent obtained.  ? ?Patient verbalizes understanding of instructions and care plan provided today and agrees to view in [place]. Active MyChart status confirmed with patient.   ? ?Junius Argyle, PharmD, BCACP, CPP  ?Clinical Pharmacist Practitioner  ?[place] ?[phone]  ?

## 2021-07-06 NOTE — Assessment & Plan Note (Signed)
Chronic, ongoing concern ?Patient is currently taking Sertraline 50 mg PO QD for depression symptoms and appears to be responding well ?States he is sleeping better and reports overall improved mood/ ability to handle sadness  ?States he is still having some episodes of sadness due to complicated health concerns but feels like they do not last as long ?Recommend continuing with current dose of 50 mg at this time  ?Follow up in 3 months to monitor with PHQ9 and GAD7 ?

## 2021-07-06 NOTE — Progress Notes (Signed)
? ?Chronic Care Management ?Pharmacy Note ? ?07/06/2021 ?Name:  Cameron Foster. MRN:  [id number] DOB:  01/14/1941 ? ?Summary: ?Patient presents for CCM follow-up. Patient recently discharged from hospitalization, multiple medication changes.  ? ?Recommendations/Changes made from today's visit: ?-Consider restarting lisinopril once AKI resolves for BP/renal protection.   ? ?Plan: ?CPP follow-up 3 months ? ?Subjective: ?Cameron Foster. is an 81 y.o. year old male who is a primary patient of Bacigalupo, Dionne Bucy, MD.  The CCM team was consulted for assistance with disease management and care coordination needs.   ? ?Engaged with patient by telephone for follow up visit in response to provider referral for pharmacy case management and/or care coordination services.  ? ?Consent to Services:  ?The patient was given information about Chronic Care Management services, agreed to services, and gave verbal consent prior to initiation of services.  Please see initial visit note for detailed documentation.  ? ?Patient Care Team: ?Cameron Crews, MD as PCP - General (Family Medicine) ?Cameron Bush, MD as PCP - Cardiology (Cardiology) ?Cameron Bush, MD as Consulting Physician (Cardiology) ?Cameron Leys, MD (Orthopedic Surgery) ?Schnier, Dolores Lory, MD (Vascular Surgery) ?Cameron Patty, MD (Dermatology) ?Cameron Meiers, MD as Consulting Physician (Ophthalmology) ?Cameron Foster, [facility] (Pharmacist) ? ?Recent office visits: ?05/24/21: Patient presented to Kirke Shaggy, LPN for AWV.  ?05/12/21: Sertraline decreased to 25 mg daily.  ?01/30/2021 [name] MD (PCP) AMB referral to community care coordination ?01/26/2021 [name] (PCP office) start guaiFENesin-dextromethorphan 100-10 mg/49m prn, start Prednisone 20 mg daily, change Meclizine 12.5 mg 3 time daily, start Zyrtec/Claritin by mouth, daily,Ambulatory referral to ENT ? ?Recent consult visits: ?12/09/2020 [name] (Cardiology) No medication  changes noted, Return to clinic in 1 year ?12/07/2020 [name] (Vascular Surgery) No Medication Changes noted,  Return in about 6 months  ?09/09/2020 [name] (Urology) start Sildenafil Citrate 20 mg daily ? ? ?Hospital visits: ?06/22/21-07/03/21: Hospitalized due to colitis. Abx 3/8-3/14. Eliquis 5 mg twice daily. Amiodarone 200 mg twice daily. Stop amlodipine, furosemide, lisinopril,tramadol.  ? ?Admitted to the hospital on 01/13/2021 due to Melena. [facility]14[facility]2021[facility]   ?  ?[facility] ?-started None ?  ?Medication Changes at Hospital Discharge: ?-Changed none ?  ?Medications Discontinued at Hospital Discharge: ?-Stopped meloxicam ,stop omeprazole ?  ?Medications that remain the same after Hospital Discharge:??  ?-All other medications will remain the same.   ?  ? ? ? ?Objective: ? ?Lab Results  ?Component Value Date  ? CREATININE 1.56 (H) 07/03/2021  ? BUN 20 07/03/2021  ? GFR 57.85 (L) 02/21/2018  ? GFRNONAA 45 (L) 07/03/2021  ? GFRAA 62 08/15/2019  ? NA 135 07/03/2021  ? K 4.6 07/03/2021  ? CALCIUM 9.0 07/03/2021  ? CO2 26 07/03/2021  ? GLUCOSE 98 07/03/2021  ? ? ?Lab Results  ?Component Value Date/Time  ? HGBA1C 5.4 09/24/2020 03:12 PM  ? HGBA1C 5.6 06/09/2019 05:27 AM  ? HGBA1C 5.3 10/11/2018 12:00 AM  ? GFR 57.85 (L) 02/21/2018 11:56 AM  ? GFR 61.28 04/19/2017 03:50 PM  ?  ?Last diabetic Eye exam: No results found for: HMDIABEYEEXA  ?Last diabetic Foot exam: No results found for: HMDIABFOOTEX  ? ?Lab Results  ?Component Value Date  ? CHOL 156 08/15/2019  ? HDL 46 08/15/2019  ? LWachapreague85 08/15/2019  ? TRIG 144 08/15/2019  ? CHOLHDL 4.4 06/09/2019  ? ? ?Hepatic Function Latest Ref Rng &  Units 06/21/2021 01/13/2021 09/24/2020  ?Total Protein 6.5 - 8.1 g/dL 7.6 6.8 6.9  ?Albumin 3.5 - 5.0 g/dL 3.9 3.6 4.1  ?AST 15 - 41 U/L 26 19 18   ?ALT 0 - 44 U/L 13 15 12   ?Alk Phosphatase 38 - 126 U/L 79 60 88  ?Total  Bilirubin 0.3 - 1.2 mg/dL 0.7 0.7 0.5  ? ? ?Lab Results  ?Component Value Date/Time  ? TSH 3.077 06/28/2021 04:26 AM  ? TSH 5.605 (H) 10/20/2018 09:46 PM  ? TSH 1.88 01/20/2015 11:22 AM  ? FREET4 0.66 10/21/2018 01:29 PM  ? ? ?CBC Latest Ref Rng & Units 07/03/2021 07/01/2021 06/28/2021  ?WBC 4.0 - 10.5 K/uL 10.4 11.3(H) 10.4  ?Hemoglobin 13.0 - 17.0 g/dL 10.5(L) 10.8(L) 10.5(L)  ?Hematocrit 39.0 - 52.0 % 33.4(L) 33.9(L) 33.3(L)  ?Platelets 150 - 400 K/uL 377 347 253  ? ? ?No results found for: VD25OH ? ?Clinical ASCVD: Yes  ?The ASCVD Risk score (Arnett DK, et al., 2019) failed to calculate for the following reasons: ?  The 2019 ASCVD risk score is only valid for ages 3 to 38 ?  The patient has a prior MI or stroke diagnosis   ? ?Depression screen [facility] 2/9 06/08/2021 05/24/2021 09/17/2020  ?Decreased Interest 3 0 0  ?Down, Depressed, Hopeless 2 0 0  ?PHQ - 2 Score 5 0 0  ?Altered sleeping 1 - 0  ?Tired, decreased energy 3 - 3  ?Change in appetite 0 - 0  ?Feeling bad or failure about yourself  0 - 0  ?Trouble concentrating 0 - 0  ?Moving slowly or fidgety/restless 0 - 0  ?Suicidal thoughts 0 - 0  ?PHQ-9 Score 9 - 3  ?Difficult doing work/chores Extremely dIfficult - Not difficult at all  ?Some recent data might be hidden  ?  ?Social History  ? ?Tobacco Use  ?Smoking Status Former  ? Packs/day: 1.00  ? Years: 27.00  ? Pack years: 27.00  ? Types: Cigarettes  ? Quit date: 04/18/1988  ? Years since quitting: 33.2  ?Smokeless Tobacco Current  ? Types: Chew  ? ?BP Readings from Last 3 Encounters:  ?07/03/21 124/68  ?06/16/21 132/83  ?06/08/21 107/74  ? ?Pulse Readings from Last 3 Encounters:  ?07/03/21 66  ?06/16/21 72  ?06/08/21 80  ? ?Wt Readings from Last 3 Encounters:  ?06/24/21 200 lb (90.7 kg)  ?06/16/21 211 lb (95.7 kg)  ?06/08/21 211 lb (95.7 kg)  ? ?BMI Readings from Last 3 Encounters:  ?06/24/21 30.41 kg/m?  ?06/16/21 32.08 kg/m?  ?06/08/21 32.08 kg/m?  ? ? ?Assessment/Interventions: Review of patient past medical history,  allergies, medications, health status, including review of consultants reports, laboratory and other test data, was performed as part of comprehensive evaluation and provision of chronic care management services.  ? ?SDOH:  (Social Determinants of Health) assessments and interventions performed: Yes ?SDOH Interventions   ? ?Flowsheet Row Most Recent Value  ?SDOH Interventions   ?Financial Strain Interventions Intervention Not Indicated  ? ?  ? ? ? ?SDOH Screenings  ? ?Alcohol Screen: Low Risk   ? Last Alcohol Screening Score (AUDIT): 4  ?Depression (PHQ2-9): Medium Risk  ? PHQ-2 Score: 9  ?Financial Resource Strain: Low Risk   ? Difficulty of Paying Living Expenses: Not hard at all  ?Food Insecurity: No Food Insecurity  ? Worried About Charity fundraiser in the Last Year: Never true  ? Ran Out of Food in the Last Year: Never true  ?Housing: Low Risk   ?  Last Housing Risk Score: 0  ?Physical Activity: Inactive  ? Days of Exercise per Week: 0 days  ? Minutes of Exercise per Session: 0 min  ?Social Connections: Moderately Integrated  ? Frequency of Communication with Friends and Family: More than three times a week  ? Frequency of Social Gatherings with Friends and Family: Once a week  ? Attends Religious Services: More than 4 times per year  ? Active Member of Clubs or Organizations: No  ? Attends Archivist Meetings: Never  ? Marital Status: Married  ?Stress: No Stress Concern Present  ? Feeling of Stress : Only a little  ?Tobacco Use: High Risk  ? Smoking Tobacco Use: Former  ? Smokeless Tobacco Use: Current  ? Passive Exposure: Not on file  ?Transportation Needs: No Transportation Needs  ? Lack of Transportation (Medical): No  ? Lack of Transportation (Non-Medical): No  ? ? ?CCM Care Plan ? ?Allergies  ?Allergen Reactions  ? Formaldehyde Rash  ?  From shoes made with this material  ? ? ?Medications Reviewed Today   ? ? Reviewed by Cameron Foster, Wade (Pharmacist) on 07/06/21 at 1143  Med List  Status: <None>  ? ?Medication Order Taking? Sig Documenting Provider Last Dose Status Informant  ?amiodarone (PACERONE) 200 MG tablet [id number] Yes Take 1 tablet (200 mg total) by mouth 2 (two) times daily. Dwyane Dee,

## 2021-07-06 NOTE — Progress Notes (Signed)
? ?I,Joseline E Rosas,acting as a scribe for [employer], PA-C.,have documented all relevant documentation on the behalf of [place], PA-C,as directed by  [employer], PA-C while in the presence of Bryndle Corredor E Clayden Withem, PA-C.  ?MyChart Video Visit ? ? ? ?Virtual Visit via Video Note  ? ?This visit type was conducted due to national recommendations for restrictions regarding the COVID-19 Pandemic (e.g. social distancing) in an effort to limit this patient's exposure and mitigate transmission in our community. This patient is at least at moderate risk for complications without adequate follow up. This format is felt to be most appropriate for this patient at this time. Physical exam was limited by quality of the video and audio technology used for the visit.  ? ?Patient location: at home in [place], [place] ?Provider location: [facility], [place], [place] ? ?I discussed the limitations of evaluation and management by telemedicine and the availability of in person appointments. The patient expressed understanding and agreed to proceed. ? ?Patient: Cameron Foster.   DOB: 1940/05/13   81 y.o. Male  MRN: [id number] ?Visit Date: 07/06/2021 ? ?Today's healthcare provider: Dani Gobble Annaliyah Willig, PA-C  ? ? ?Subjective  ?  ?HPI  ?Depression, Follow-up ? ?He  was last seen for this 4 weeks ago. ?Changes made at last visit include: increasing Sertraline to 50 mg PO QD ?He reports excellent compliance with treatment. ?He is not having side effects.  ? ?He reports excellent tolerance of treatment. ?Current symptoms include: depressed mood ?He feels he is Improved since last visit. ? ?Depression screen [facility] 2/9 07/06/2021 06/08/2021 05/24/2021  ?Decreased Interest 1 3 0  ?Down, Depressed, Hopeless 1 2 0  ?PHQ - 2 Score 2 5 0  ?Altered sleeping 3 1 -  ?Tired, decreased energy 3 3 -  ?Change in appetite 0 0 -  ?Feeling bad or failure about yourself  0 0 -  ?Trouble concentrating 0 0 -  ?Moving slowly or fidgety/restless 0 0 -   ?Suicidal thoughts 0 0 -  ?PHQ-9 Score 8 9 -  ?Difficult doing work/chores Not difficult at all Extremely dIfficult -  ?Some recent data might be hidden  ?  ?GAD 7 : Generalized Anxiety Score 07/06/2021  ?Nervous, Anxious, on Edge 2  ?Control/stop worrying 2  ?Worry too much - different things 1  ?Trouble relaxing 1  ?Restless 0  ?Easily annoyed or irritable 0  ?Afraid - awful might happen 0  ?Total GAD 7 Score 6  ?Anxiety Difficulty Not difficult at all  ? ?  ?-----------------------------------------------------------------------------------------  ? ? ?Reports he is feeling a lot better on increased dose of Sertraline ?Denies concerns for side effects at this time ( ROS completed with pertinent +/-) ?Sleep: "I think I sleep too much"  ?States he is sleeping about 9 hours and feels well rested ?Denies daytime drowsiness - agrees that he is getting more and better quality sleep.  ?Mood: "it's okay" denies persistent sadness or depressed mood ? ? ? ?Medications: ?Outpatient Medications Prior to Visit  ?Medication Sig  ? amiodarone (PACERONE) 200 MG tablet Take 1 tablet (200 mg total) by mouth 2 (two) times daily.  ? apixaban (ELIQUIS) 5 MG TABS tablet Take 1 tablet (5 mg total) by mouth 2 (two) times daily.  ? aspirin 81 MG EC tablet Take 81 mg by mouth daily.   ? atorvastatin (LIPITOR) 40 MG tablet TAKE 1 TABLET BY MOUTH EVERY DAY  ? bisacodyl (DULCOLAX) 5 MG EC tablet Take  1 tablet (5 mg total) by mouth daily as needed for moderate constipation.  ? fluticasone (FLONASE) 50 MCG/ACT nasal spray Place 1 spray into both nostrils daily as needed for allergies.  ? folic acid (FOLVITE) 1 MG tablet Take 1 tablet (1 mg total) by mouth daily.  ? Guaifenesin (MUCINEX MAXIMUM STRENGTH) 1200 MG TB12 Take 1 tablet by mouth daily as needed.  ? hydrocortisone 2.5 % cream APPLY TO AFFECTED AREA TWICE A DAY AS NEEDED FOR RASH  ? meclizine (ANTIVERT) 25 MG tablet Take 25 mg by mouth daily as needed (Vertigo).  ? metoprolol tartrate  (LOPRESSOR) 100 MG tablet Take 1 tablet (100 mg total) by mouth 2 (two) times daily.  ? Multiple Vitamin (MULTIVITAMIN WITH MINERALS) TABS tablet Take 1 tablet by mouth daily. (Patient taking differently: Take 1 tablet by mouth daily. Mens daily 50+)  ? pantoprazole (PROTONIX) 40 MG tablet Take 40 mg by mouth 2 (two) times daily.  ? sertraline (ZOLOFT) 50 MG tablet Take 1 tablet (50 mg total) by mouth daily.  ? vitamin B-12 (CYANOCOBALAMIN) 1000 MCG tablet Take 1,000 mcg by mouth daily.  ? [DISCONTINUED] docusate sodium (COLACE) 100 MG capsule Take 100 mg by mouth daily as needed for mild constipation. (Patient not taking: Reported on 07/06/2021)  ? [DISCONTINUED] hydrocortisone (ANUSOL-HC) 25 MG suppository Place 1 suppository (25 mg total) rectally 2 (two) times daily as needed for up to 14 days for hemorrhoids or anal itching. (Patient not taking: Reported on 07/06/2021)  ? [DISCONTINUED] melatonin 5 MG TABS Take 2 tablets (10 mg total) by mouth at bedtime. (Patient not taking: Reported on 07/06/2021)  ? [DISCONTINUED] polyethylene glycol (MIRALAX) 17 g packet Take 17 g by mouth daily. (Patient not taking: Reported on 07/06/2021)  ? [DISCONTINUED] thiamine 100 MG tablet Take 1 tablet (100 mg total) by mouth daily. (Patient not taking: Reported on 07/06/2021)  ? ?No facility-administered medications prior to visit.  ? ? ?Review of Systems  ?Constitutional:  Negative for fatigue.  ?Respiratory:  Negative for cough and shortness of breath.   ?Gastrointestinal:  Negative for diarrhea, nausea and vomiting.  ?Neurological:  Positive for light-headedness. Negative for dizziness, syncope, weakness and headaches.  ?Psychiatric/Behavioral:  Positive for dysphoric mood. Negative for behavioral problems, confusion, hallucinations, self-injury, sleep disturbance and suicidal ideas. The patient is not nervous/anxious.   ? ? ? Objective  ?  ?There were no vitals taken for this visit. ? ? ?Physical Exam ?Constitutional:   ?    Appearance: Normal appearance.  ?HENT:  ?   Head: Normocephalic and atraumatic.  ?Pulmonary:  ?   Effort: Pulmonary effort is normal.  ?Neurological:  ?   General: No focal deficit present.  ?   Mental Status: He is alert and oriented to person, place, and time. Mental status is at baseline.  ?Psychiatric:     ?   Attention and Perception: Attention normal.     ?   Mood and Affect: Mood and affect normal.     ?   Speech: Speech normal.     ?   Behavior: Behavior normal. Behavior is cooperative.     ?   Thought Content: Thought content normal.     ?   Judgment: Judgment normal.  ?  ? ? ? Assessment & Plan  ?  ? ?Problem List Items Addressed This Visit   ? ?  ? Other  ? Depression, major, single episode, mild ([place])  ?  Chronic, ongoing concern ?Patient is currently taking  Sertraline 50 mg PO QD for depression symptoms and appears to be responding well ?States he is sleeping better and reports overall improved mood/ ability to handle sadness  ?States he is still having some episodes of sadness due to complicated health concerns but feels like they do not last as long ?Recommend continuing with current dose of 50 mg at this time  ?Follow up in 3 months to monitor with PHQ9 and GAD7 ?  ?  ? ? ?I discussed the assessment and treatment plan with the patient. The patient was provided an opportunity to ask questions and all were answered. The patient agreed with the plan and demonstrated an understanding of the instructions. ?  ?The patient was advised to call back or seek an in-person evaluation if the symptoms worsen or if the condition fails to improve as anticipated. ? ?I provided 10 minutes of  face-to-face time via virtual conference during this encounter. ? ?No follow-ups on file. ? ? ?I, Jonnie Kubly E Beldon Nowling, PA-C, have reviewed all documentation for this visit. The documentation on 07/06/21 for the exam, diagnosis, procedures, and orders are all accurate and complete. ? ? ?Yancarlos Berthold, [place] MPH ?[place] [facility] ? ? ? ? ?

## 2021-07-07 ENCOUNTER — Telehealth: Payer: Self-pay

## 2021-07-07 NOTE — Telephone Encounter (Signed)
Spoke with pt and he is aware of message below.  ?Pt has been scheduled with Dr. Saunders Revel 07/28/21. Pt refused sooner appt with APP.  ?Pt has no further questions at this time.  ?

## 2021-07-07 NOTE — Telephone Encounter (Signed)
Copied from [place] [id number]. Topic: Quick Communication - Home Health Verbal Orders >> Jul 06, 2021  5:04 PM [place] wrote: Caller/Agency: Joya Gaskins @ [place] Number: [phone] Requesting PT/ Therapy:  Frequency: 1 wk 1   then  2 wk 4

## 2021-07-08 ENCOUNTER — Telehealth: Payer: Self-pay | Admitting: Family Medicine

## 2021-07-08 NOTE — Telephone Encounter (Signed)
OK for verbals. I'm seeing him tomorrow and will get labs then as they won't result before his visit anyways. Thanks! ?

## 2021-07-08 NOTE — Telephone Encounter (Signed)
Jatana advised. ?

## 2021-07-08 NOTE — Telephone Encounter (Signed)
Copied from [place] #[id number]. Topic: Quick Communication - Home Health Verbal Orders ?>> Jul 08, 2021  3:24 PM Leward Quan A wrote: ?Caller/Agency: Otila Kluver // [place]  ?Callback Number: [phone] Ok to [facility] ?Requesting OT/PT/Skilled Nursing/Social Work/Speech Therapy: Skilled nursing  ?Frequency: 1 w 6  ?Have a question that needs an answer please call Otila Kluver. Per Otila Kluver want to know if Dr B want skilled nursing to go ahead and draw labs that were requested on discharge paper work.? For CBC and BMP ?

## 2021-07-08 NOTE — Telephone Encounter (Signed)
OK for verbals ?

## 2021-07-08 NOTE — Telephone Encounter (Signed)
Cameron Foster advised. ?

## 2021-07-09 ENCOUNTER — Telehealth: Payer: Self-pay | Admitting: Internal Medicine

## 2021-07-09 ENCOUNTER — Other Ambulatory Visit: Payer: Self-pay

## 2021-07-09 ENCOUNTER — Telehealth (INDEPENDENT_AMBULATORY_CARE_PROVIDER_SITE_OTHER): Payer: Medicare Other | Admitting: Family Medicine

## 2021-07-09 ENCOUNTER — Other Ambulatory Visit: Payer: Medicare Other

## 2021-07-09 ENCOUNTER — Encounter: Payer: Self-pay | Admitting: Family Medicine

## 2021-07-09 VITALS — HR 68

## 2021-07-09 DIAGNOSIS — I10 Essential (primary) hypertension: Secondary | ICD-10-CM | POA: Diagnosis not present

## 2021-07-09 DIAGNOSIS — K51511 Left sided colitis with rectal bleeding: Secondary | ICD-10-CM | POA: Diagnosis not present

## 2021-07-09 DIAGNOSIS — N1831 Chronic kidney disease, stage 3a: Secondary | ICD-10-CM | POA: Diagnosis not present

## 2021-07-09 DIAGNOSIS — I4819 Other persistent atrial fibrillation: Secondary | ICD-10-CM

## 2021-07-09 DIAGNOSIS — D62 Acute posthemorrhagic anemia: Secondary | ICD-10-CM | POA: Diagnosis not present

## 2021-07-09 DIAGNOSIS — I4891 Unspecified atrial fibrillation: Secondary | ICD-10-CM

## 2021-07-09 NOTE — Assessment & Plan Note (Signed)
Secondary to left-sided colitis during hospitalization ?His hemoglobin remained stable after admission ?Repeat CBC upcoming from home health RN ?

## 2021-07-09 NOTE — Telephone Encounter (Signed)
Pt hs appt with Dr. Saunders Revel 07/28/21. I s/w the pt and gave him the options if he wanted to keep the in office with Dr. Saunders Revel or did he want to do a tele pre op appt. Pt has opted to keep the in office appt with Dr. Saunders Revel. Pt thanked me for the call and the help. I assured the pt that I will let Dr. Harlow Mares know of appt 07/28/21. ?

## 2021-07-09 NOTE — Progress Notes (Signed)
? ? ?I,Sulibeya S Dimas,acting as a scribe for Lavon Paganini, MD.,have documented all relevant documentation on the behalf of Lavon Paganini, MD,as directed by  Lavon Paganini, MD while in the presence of Lavon Paganini, MD. ? ?MyChart Video Visit ? ? ? ?Virtual Visit via Video Note  ? ?This visit type was conducted due to national recommendations for restrictions regarding the COVID-19 Pandemic (e.g. social distancing) in an effort to limit this patient's exposure and mitigate transmission in our community. This patient is at least at moderate risk for complications without adequate follow up. This format is felt to be most appropriate for this patient at this time. Physical exam was limited by quality of the video and audio technology used for the visit.  ? ? ?Patient location: home ?Provider location: [facility] ?Persons involved in the visit: patient, provider ? ?I discussed the limitations of evaluation and management by telemedicine and the availability of in person appointments. The patient expressed understanding and agreed to proceed. ? ?Patient: Cameron Foster.   DOB: 1940/09/01   81 y.o. Male  MRN: [id number] ?Visit Date: 07/09/2021 ? ?Today's healthcare provider: Lavon Paganini, MD  ? ?Chief Complaint  ?Patient presents with  ? Hospitalization Follow-up  ? ?Subjective  ?  ?HPI  ?Follow up Hospitalization ? ?Patient was admitted to [facility] on 06/22/21 and discharged on 07/03/21. ?He was treated for left sided colitis. ?Treatment for this included start antibiotics. Antibiotic completed on 06/29/21. ?Telephone follow up was done on 07/05/21 ?He reports excellent compliance with treatment. ?He reports this condition is resolved. ? ?----------------------------------------------------------------------------------------- - ?Patient was admitted for rectal bleeding and decrease of hemoglobin from baseline of 13.2-10.4.  GI was consulted.  CT angiogram showed left-sided colitis  possibly infectious/inflammatory.  Hemoglobin remained stable throughout hospitalization.  He finished his antibiotics on 3/14 for his colitis.  Flex sig showed possible ischemic colitis.  [place]sey count had normalized. ? ? ?Patient went into persistent a fib with RVR while hospitalized.  His bleeding risk was assessed and believed to be lower than thromboembolic risk.  He was started on Eliquis 5 mg twice daily.  Echo showed mild decrease in ejection fraction of 45 to 50%.  He received IV Lasix for slight volume overload.  Feels like his heart rate is running normal now. Upcoming appt with Cardiology on 4/12 Dr End ? ?States that he is skeptical to continue to take Eliquis due to bleeding risk.  He does not think this is worth it to decrease stroke risk. ? ?No further rectal bleeding.  Has upcoming f/u with [facility] on 4/18.   ? ?Patient had mild AKI thought to be prerenal related to dehydration.  He was recommended to have his creatinine rechecked, but he did not want to stay an additional night in the hospital. ? ?Has HH coming to the home. Had first assessment and waiting to see the benefit. They are going to draw home labs CBC and BMP next week. ? ?Patient was due for hip arthroplasty on 3/13.  Given new onset A-fib, surgery was postponed.  He will need to be optimized by Dr. Saunders Revel prior to going forward with surgery. ? ? ?Medications: ?Outpatient Medications Prior to Visit  ?Medication Sig  ? amiodarone (PACERONE) 200 MG tablet Take 1 tablet (200 mg total) by mouth 2 (two) times daily.  ? apixaban (ELIQUIS) 5 MG TABS tablet Take 1 tablet (5 mg total) by mouth 2 (two) times daily.  ? aspirin 81 MG EC tablet Take  81 mg by mouth daily.   ? atorvastatin (LIPITOR) 40 MG tablet TAKE 1 TABLET BY MOUTH EVERY DAY  ? bisacodyl (DULCOLAX) 5 MG EC tablet Take 1 tablet (5 mg total) by mouth daily as needed for moderate constipation.  ? fluticasone (FLONASE) 50 MCG/ACT nasal spray Place 1 spray into both nostrils daily as needed  for allergies.  ? folic acid (FOLVITE) 1 MG tablet Take 1 tablet (1 mg total) by mouth daily.  ? Guaifenesin (MUCINEX MAXIMUM STRENGTH) 1200 MG TB12 Take 1 tablet by mouth daily as needed.  ? hydrocortisone 2.5 % cream APPLY TO AFFECTED AREA TWICE A DAY AS NEEDED FOR RASH  ? meclizine (ANTIVERT) 25 MG tablet Take 25 mg by mouth daily as needed (Vertigo).  ? metoprolol tartrate (LOPRESSOR) 100 MG tablet Take 1 tablet (100 mg total) by mouth 2 (two) times daily.  ? Multiple Vitamin (MULTIVITAMIN WITH MINERALS) TABS tablet Take 1 tablet by mouth daily. (Patient taking differently: Take 1 tablet by mouth daily. Mens daily 50+)  ? pantoprazole (PROTONIX) 40 MG tablet Take 40 mg by mouth 2 (two) times daily.  ? sertraline (ZOLOFT) 50 MG tablet Take 1 tablet (50 mg total) by mouth daily.  ? vitamin B-12 (CYANOCOBALAMIN) 1000 MCG tablet Take 1,000 mcg by mouth daily.  ? ?No facility-administered medications prior to visit.  ? ? ?Review of Systems  ?Constitutional: Negative.   ?Respiratory: Negative.    ?Cardiovascular: Negative.   ?Gastrointestinal: Negative.   ? ? ? Objective  ?  ?Pulse 68   SpO2 91%  ? ? ?Physical Exam ?Constitutional:   ?   General: He is not in acute distress. ?   Appearance: Normal appearance. He is not diaphoretic.  ?HENT:  ?   Head: Normocephalic.  ?Eyes:  ?   Conjunctiva/sclera: Conjunctivae normal.  ?Pulmonary:  ?   Effort: Pulmonary effort is normal. No respiratory distress.  ?Neurological:  ?   Mental Status: He is alert and oriented to person, place, and time. Mental status is at baseline.  ?  ? ? ? Assessment & Plan  ?  ? ?Problem List Items Addressed This Visit   ? ?  ? Cardiovascular and Mediastinum  ? Essential hypertension - Primary  ?  Reports home blood pressures remain normal on new medication regimen ?No changes to medications today ?Upcoming repeat BMP from home health RN ?  ?  ? Atrial fibrillation with rapid ventricular response ([place])  ?  New problem during hospitalization ?No  longer in RVR ?On amiodarone for rhythm control, Eliquis for anticoagulation, metoprolol for rate control ?Upcoming follow-up with cardiology ?Upcoming repeat CBC from home health RN ?  ?  ? Persistent atrial fibrillation ([place])  ?  New onset persistent A-fib during hospitalization associated with RVR as below ?Now followed by cardiology with upcoming follow-up appointment ?We discussed his bleeding risk versus his thromboembolic risk and the rationale behind treating with Eliquis to prevent stroke ?Patient remains hesitant to take the Eliquis, but agrees to take it at least until he sees Dr. Saunders Revel and has that conversation as well ?He reports that his heart rate and blood pressure have been normal since discharge and he is taking his medications with good compliance ?  ?  ?  ? Digestive  ? Left sided colitis ([place])  ?  Ischemic versus infectious ?Completed antibiotic course during hospitalization ?No further symptoms ?Upcoming follow-up with GI ?Recheck CBC to ensure stable hemoglobin and normal Ney count upcoming from home health RN ?  ?  ?  ?  Genitourinary  ? Stage 3a chronic kidney disease ([place])  ?  AKI on CKD during hospitalization ?Likely prerenal related to dehydration and blood loss ?Upcoming repeat BMP with home health RN which will be reviewed ?  ?  ?  ? Other  ? Acute blood loss anemia  ?  Secondary to left-sided colitis during hospitalization ?His hemoglobin remained stable after admission ?Repeat CBC upcoming from home health RN ?  ?  ? ? ? ?Return in about 3 months (around 10/09/2021) for chronic disease f/u.  ?  ? ?I discussed the assessment and treatment plan with the patient. The patient was provided an opportunity to ask questions and all were answered. The patient agreed with the plan and demonstrated an understanding of the instructions. ?  ?The patient was advised to call back or seek an in-person evaluation if the symptoms worsen or if the condition fails to improve as anticipated. ? ? ?I, Lavon Paganini, MD, have reviewed all documentation for this visit. The documentation on 07/09/21 for the exam, diagnosis, procedures, and orders are all accurate and complete. ? ? ?[name], MD, MPH ?[place]

## 2021-07-09 NOTE — Telephone Encounter (Signed)
Preoperative team, please contact this patient and set up a phone call appointment for further cardiac evaluation.  Thank you for your help. ? ?Jossie Ng. Eeva Schlosser NP-C ? ?  ?07/09/2021, 3:22 PM ?[place] ?[address] ?Office [phone] Fax [phone] ? ?

## 2021-07-09 NOTE — Telephone Encounter (Signed)
? ?  Pre-operative Risk Assessment  ?  ?Patient Name: Cameron Foster.  ?DOB: 09-Jun-1940 ?MRN: [id number]  ? ?  ? ?Request for Surgical Clearance   ? ?Procedure:  Rt THA Anterior Hip ? ?Date of Surgery:  Clearance TBD                              ?   ?Surgeon:  Kurtis Bushman ?Surgeon's Group or Practice Name:  [facility] ?Phone number:  [phone] ?Fax number:  [phone] ?  ?Type of Clearance Requested:   ?- Medical  ?  ?Type of Anesthesia: Local/ Spinal ?  ?Additional requests/questions:   ? ?Signed, ?Pilar A Ham   ?07/09/2021, 3:09 PM   ?

## 2021-07-09 NOTE — Assessment & Plan Note (Signed)
Ischemic versus infectious ?Completed antibiotic course during hospitalization ?No further symptoms ?Upcoming follow-up with GI ?Recheck CBC to ensure stable hemoglobin and normal Shakir count upcoming from home health RN ?

## 2021-07-09 NOTE — Assessment & Plan Note (Signed)
AKI on CKD during hospitalization ?Likely prerenal related to dehydration and blood loss ?Upcoming repeat BMP with home health RN which will be reviewed ?

## 2021-07-09 NOTE — Assessment & Plan Note (Signed)
New onset persistent A-fib during hospitalization associated with RVR as below ?Now followed by cardiology with upcoming follow-up appointment ?We discussed his bleeding risk versus his thromboembolic risk and the rationale behind treating with Eliquis to prevent stroke ?Patient remains hesitant to take the Eliquis, but agrees to take it at least until he sees Dr. Saunders Revel and has that conversation as well ?He reports that his heart rate and blood pressure have been normal since discharge and he is taking his medications with good compliance ?

## 2021-07-09 NOTE — Assessment & Plan Note (Signed)
New problem during hospitalization ?No longer in RVR ?On amiodarone for rhythm control, Eliquis for anticoagulation, metoprolol for rate control ?Upcoming follow-up with cardiology ?Upcoming repeat CBC from home health RN ?

## 2021-07-09 NOTE — Assessment & Plan Note (Signed)
Reports home blood pressures remain normal on new medication regimen ?No changes to medications today ?Upcoming repeat BMP from home health RN ?

## 2021-07-10 NOTE — Telephone Encounter (Signed)
Patient had a complicated admission at [facility] with acute GI bleed complicated by atrial fibrillation with rapid ventricular response.  He should follow-up with [place] in the office as scheduled.  It will likely be several months before he is in a position to consider undergoing elective orthopedic surgery. ? ?[name], MD ?[facility] ? ?

## 2021-07-12 ENCOUNTER — Telehealth: Payer: Self-pay | Admitting: Nurse Practitioner

## 2021-07-12 NOTE — Telephone Encounter (Signed)
I will update the requesting office to see notes from Dr. Saunders Revel.  ?

## 2021-07-12 NOTE — Telephone Encounter (Signed)
Spoke with patient and have scheduled a MyChart Palliative f/u visit for 08/10/21 @ 1:30 PM ?

## 2021-07-13 ENCOUNTER — Other Ambulatory Visit: Payer: Self-pay | Admitting: Family Medicine

## 2021-07-13 ENCOUNTER — Telehealth: Payer: Self-pay | Admitting: Internal Medicine

## 2021-07-13 NOTE — Telephone Encounter (Signed)
Patient was told he would be getting paperwork sent to him to apply for financial assistance for his Eliquis. The patient has not received any paperwork yet.  ?

## 2021-07-13 NOTE — Telephone Encounter (Signed)
Spoke with pt. Notified that I have mailed pt assistance application. Pt states that he does have Eliquis on hand. Advised he let us know if running low while waiting for application to process if he has a cost issue. Pt appreciative and has no further needs at this time. Pt will follow up as scheduled next month.  ?

## 2021-07-14 DIAGNOSIS — Z515 Encounter for palliative care: Secondary | ICD-10-CM

## 2021-07-14 NOTE — Progress Notes (Signed)
PC SW outreached patient, per [facility] NP - C. Gusler SW referral request to assess patient needs. ? ?Patient shared that he needs assistance around his home every now and then to assist ith small household task such as changing filters, checking door alarms and smoke detectors from time to time, due to him not feeling comfortable with climbing a ladder to do these things any longer.  ? ?SW and patient discussed possible resources and assistance that patient could consider for these task such as inquiring with his church's [employer] about recruiting some assistance as well reviewing the terms of his LTC policy to see if it covers in home care assistance.  ? ? ?SW provided MI tactics and supportive listening was employed due to patient sharing some of his apprehension of seeking/asking for assistance. SW validated patients feelings and encouraged him to seek assistance while still attempting to be as independent as possible.  ? ?Patient very appreciative of call and suggestions/recommendations. Patient had not other needs/concerns for SW at this time. Patient has SW Psychologist, counselling. SW will mail additional support and resources to patient, as requested to address on file, for future needs. ?

## 2021-07-15 ENCOUNTER — Telehealth: Payer: Self-pay | Admitting: Family Medicine

## 2021-07-15 DIAGNOSIS — I10 Essential (primary) hypertension: Secondary | ICD-10-CM

## 2021-07-15 NOTE — Telephone Encounter (Signed)
Patient reports he will come into our lab today.  ?

## 2021-07-15 NOTE — Telephone Encounter (Signed)
Tiffany with [facility] called to let Dr. B know that they received a lab order from her for patient. She stated that patient refused getting her labs drawn and any other services with them. Please return call to Ms.Tiffany for further advised about patient care. [phone] option #2

## 2021-07-16 ENCOUNTER — Other Ambulatory Visit: Payer: Self-pay

## 2021-07-16 ENCOUNTER — Encounter: Payer: Self-pay | Admitting: Family Medicine

## 2021-07-16 ENCOUNTER — Other Ambulatory Visit (INDEPENDENT_AMBULATORY_CARE_PROVIDER_SITE_OTHER): Payer: Medicare Other | Admitting: Family Medicine

## 2021-07-16 DIAGNOSIS — I480 Paroxysmal atrial fibrillation: Secondary | ICD-10-CM

## 2021-07-16 DIAGNOSIS — K219 Gastro-esophageal reflux disease without esophagitis: Secondary | ICD-10-CM | POA: Diagnosis not present

## 2021-07-16 DIAGNOSIS — I4510 Unspecified right bundle-branch block: Secondary | ICD-10-CM

## 2021-07-16 DIAGNOSIS — K5903 Drug induced constipation: Secondary | ICD-10-CM

## 2021-07-16 DIAGNOSIS — I110 Hypertensive heart disease with heart failure: Secondary | ICD-10-CM

## 2021-07-16 DIAGNOSIS — I4819 Other persistent atrial fibrillation: Secondary | ICD-10-CM | POA: Diagnosis not present

## 2021-07-16 DIAGNOSIS — I10 Essential (primary) hypertension: Secondary | ICD-10-CM | POA: Diagnosis not present

## 2021-07-16 DIAGNOSIS — K51511 Left sided colitis with rectal bleeding: Secondary | ICD-10-CM

## 2021-07-16 DIAGNOSIS — M1611 Unilateral primary osteoarthritis, right hip: Secondary | ICD-10-CM

## 2021-07-16 DIAGNOSIS — I25119 Atherosclerotic heart disease of native coronary artery with unspecified angina pectoris: Secondary | ICD-10-CM

## 2021-07-16 DIAGNOSIS — K5790 Diverticulosis of intestine, part unspecified, without perforation or abscess without bleeding: Secondary | ICD-10-CM

## 2021-07-16 DIAGNOSIS — T402X5D Adverse effect of other opioids, subsequent encounter: Secondary | ICD-10-CM

## 2021-07-16 DIAGNOSIS — F320 Major depressive disorder, single episode, mild: Secondary | ICD-10-CM

## 2021-07-16 DIAGNOSIS — Z9181 History of falling: Secondary | ICD-10-CM

## 2021-07-16 DIAGNOSIS — I444 Left anterior fascicular block: Secondary | ICD-10-CM

## 2021-07-16 DIAGNOSIS — I5021 Acute systolic (congestive) heart failure: Secondary | ICD-10-CM | POA: Diagnosis not present

## 2021-07-16 DIAGNOSIS — D62 Acute posthemorrhagic anemia: Secondary | ICD-10-CM

## 2021-07-16 DIAGNOSIS — K589 Irritable bowel syndrome without diarrhea: Secondary | ICD-10-CM | POA: Diagnosis not present

## 2021-07-16 DIAGNOSIS — E785 Hyperlipidemia, unspecified: Secondary | ICD-10-CM

## 2021-07-16 LAB — CBC WITH DIFFERENTIAL/PLATELET
Basophils Absolute: 0.1 10*3/uL (ref 0.0–0.2)
Basos: 1 %
EOS (ABSOLUTE): 0.2 10*3/uL (ref 0.0–0.4)
Eos: 2 %
Hematocrit: 33.1 % — ABNORMAL LOW (ref 37.5–51.0)
Hemoglobin: 10.7 g/dL — ABNORMAL LOW (ref 13.0–17.7)
Immature Grans (Abs): 0 10*3/uL (ref 0.0–0.1)
Immature Granulocytes: 0 %
Lymphocytes Absolute: 1.2 10*3/uL (ref 0.7–3.1)
Lymphs: 13 %
MCH: 28.6 pg (ref 26.6–33.0)
MCHC: 32.3 g/dL (ref 31.5–35.7)
MCV: 89 fL (ref 79–97)
Monocytes Absolute: 0.6 10*3/uL (ref 0.1–0.9)
Monocytes: 7 %
Neutrophils Absolute: 6.9 10*3/uL (ref 1.4–7.0)
Neutrophils: 77 %
Platelets: 310 10*3/uL (ref 150–450)
RBC: 3.74 x10E6/uL — ABNORMAL LOW (ref 4.14–5.80)
RDW: 12.9 % (ref 11.6–15.4)
WBC: 9 10*3/uL (ref 3.4–10.8)

## 2021-07-16 LAB — COMPREHENSIVE METABOLIC PANEL
ALT: 12 IU/L (ref 0–44)
AST: 18 IU/L (ref 0–40)
Albumin/Globulin Ratio: 1.4 (ref 1.2–2.2)
Albumin: 3.7 g/dL (ref 3.7–4.7)
Alkaline Phosphatase: 80 IU/L (ref 44–121)
BUN/Creatinine Ratio: 8 — ABNORMAL LOW (ref 10–24)
BUN: 12 mg/dL (ref 8–27)
Bilirubin Total: 0.3 mg/dL (ref 0.0–1.2)
CO2: 23 mmol/L (ref 20–29)
Calcium: 9.4 mg/dL (ref 8.6–10.2)
Chloride: 102 mmol/L (ref 96–106)
Creatinine, Ser: 1.47 mg/dL — ABNORMAL HIGH (ref 0.76–1.27)
Globulin, Total: 2.7 g/dL (ref 1.5–4.5)
Glucose: 130 mg/dL — ABNORMAL HIGH (ref 70–99)
Potassium: 3.9 mmol/L (ref 3.5–5.2)
Sodium: 140 mmol/L (ref 134–144)
Total Protein: 6.4 g/dL (ref 6.0–8.5)
eGFR: 48 mL/min/{1.73_m2} — ABNORMAL LOW (ref >59)

## 2021-07-16 NOTE — Progress Notes (Signed)
Patient advised to come in for lab in one month.  ?

## 2021-07-16 NOTE — Progress Notes (Signed)
Received home health orders orders from [facility]. ?Start of care 07/05/2021.   Certification and orders from 07/05/2021 through 09/02/2021 are reviewed, signed and faxed back to home health company. ? ?Patient was discharged from [facility] on 07/03/2021 and face-to-face was conducted for home health and order was placed at that time.  Hospital follow-up visit was conducted by me on 07/09/2021 and we reviewed necessity for home health at that time.  Major indications for home health are included in the diagnoses below, but include left-sided colitis, diverticulosis, A-fib with RVR, history of falling. ? ?Patient is receiving home health services for the following diagnoses: ? ?Problem List Items Addressed This Visit   ? ?  ? Cardiovascular and Mediastinum  ? Paroxysmal atrial fibrillation (HCC)  ? Persistent atrial fibrillation ([place])  ?  ? Digestive  ? Left sided colitis ([place]) - Primary  ?  ? Musculoskeletal and Integument  ? Osteoarthritis of hip  ?  ? Other  ? Constipation  ? Acute blood loss anemia  ? ?Other Visit Diagnoses   ? ? Diverticulosis      ? Irritable bowel syndrome without diarrhea      ? Adverse effect of other opioids, subsequent encounter      ? Gastroesophageal reflux disease without esophagitis      ? Hypertensive heart disease with congestive heart failure, unspecified heart failure type ([place])      ? Acute systolic heart failure ([place])      ? Right bundle branch block      ? Left anterior fascicular block      ? Hyperlipidemia, unspecified hyperlipidemia type      ? Atherosclerosis of native coronary artery of native heart with angina pectoris ([place])      ? Major depressive disorder, single episode, mild ([name])      ? Personal history of fall      ? ?  ?  ?

## 2021-07-22 ENCOUNTER — Ambulatory Visit (INDEPENDENT_AMBULATORY_CARE_PROVIDER_SITE_OTHER): Payer: Medicare Other | Admitting: Internal Medicine

## 2021-07-22 ENCOUNTER — Encounter: Payer: Self-pay | Admitting: Internal Medicine

## 2021-07-22 ENCOUNTER — Other Ambulatory Visit: Payer: Self-pay | Admitting: Family Medicine

## 2021-07-22 ENCOUNTER — Other Ambulatory Visit: Payer: Self-pay | Admitting: *Deleted

## 2021-07-22 ENCOUNTER — Ambulatory Visit (INDEPENDENT_AMBULATORY_CARE_PROVIDER_SITE_OTHER): Payer: Medicare Other

## 2021-07-22 VITALS — BP 130/80 | HR 58 | Ht 68.5 in | Wt 204.4 lb

## 2021-07-22 DIAGNOSIS — I4819 Other persistent atrial fibrillation: Secondary | ICD-10-CM | POA: Diagnosis not present

## 2021-07-22 DIAGNOSIS — I10 Essential (primary) hypertension: Secondary | ICD-10-CM | POA: Diagnosis not present

## 2021-07-22 DIAGNOSIS — Z01810 Encounter for preprocedural cardiovascular examination: Secondary | ICD-10-CM | POA: Diagnosis not present

## 2021-07-22 DIAGNOSIS — Z8673 Personal history of transient ischemic attack (TIA), and cerebral infarction without residual deficits: Secondary | ICD-10-CM | POA: Diagnosis not present

## 2021-07-22 MED ORDER — APIXABAN 5 MG PO TABS
5.0000 mg | ORAL_TABLET | Freq: Two times a day (BID) | ORAL | 3 refills | Status: DC
Start: 2021-07-22 — End: 2021-08-17

## 2021-07-22 NOTE — Progress Notes (Signed)
? ?Follow-up Outpatient Visit ?Date: 07/22/2021 ? ?Primary Care Provider: ?[name], MD ?[address] ?[place] [address] ? ?Chief Complaint: Follow-up atrial fibrillation ? ?HPI:  Cameron Foster is a 81 y.o. male with history of recently persistent atrial fibrillation in the setting of GI bleed from colitis, hypertension, hyperlipidemia, subarachnoid hemorrhage of uncertain etiology complicated by the recent stroke, prostate cancer, and alcohol abuse, who presents for follow-up of atrial fibrillation.  His hospitalization was complicated by hypotension and atrial fibrillation with rapid ventricular response.  He was ultimately placed on IV amiodarone by Dr. Rockey Situ for rate control, which led to pharmacologic cardioversion.  After extensive discussion of risks versus benefits of anticoagulation with the patient and his family, decision was made to initiate apixaban.  He had been scheduled for hip arthroplasty, though this has been on hold pending follow-up from his recent hospitalization. ? ?Today, Cameron Foster reports that he has been feeling fairly well.  He denies chest pain, palpitations, lightheadedness, and further abdominal pain/bleeding.  He has not felt frankly short of breath though he feels like his breathing is a bit shallower than usual.  Chronic right lower extremity swelling following remote calf injury is unchanged from baseline.  He has been taking apixaban as prescribed but is concerned about taking this long-term given his history of bleeding problems.  He also inquires as to when he can proceed with his right hip arthroplasty, as his mobility remains fairly limited by this.  He is able to walk and climb a flight of stairs without chest pain. ? ?-------------------------------------------------------------------------------------------------- ? ?Past Medical History:  ?Diagnosis Date  ? Alcoholism ([place])   ? Arthritis   ? Atrial fibrillation ([place]) 1995  ? one episode  ? Basal  cell carcinoma 04/26/2017  ? Right medial cheek. Superficial and nodular  ? Basal cell carcinoma 06/11/2019  ? Left anterior shoulder. Nodular pattern  ? Basal cell carcinoma 09/09/2019  ? Right nasal ala, EDC  ? Basal cell carcinoma 02/05/2020  ? L upper eyebrow, [facility]   ? Carotid arterial disease ([place])   ? a. 07-04-2019 Carotid U/S: <50% bilat ICA stenoses.  ? Chicken pox   ? Colon cancer ([place])   ? Colon polyp   ? Depression   ? Son died 07/03/2014  ? Diverticulitis   ? Diverticulosis 30 years  ? Diverticulosis   ? Dysrhythmia   ? Gastritis   ? GERD (gastroesophageal reflux disease)   ? H. pylori infection   ? History of cerebral hemorrhage   ? History of echocardiogram   ? a. 07/04/2019 Echo: EF 55-60%, no rwma, triv MR/AI.  ? History of stress test   ? a. 09/2015 MV: No ischemia/infarct. EF 45-54% (nl by echo).  ? Hyperlipidemia   ? Hypertension   ? Irritable bowel syndrome   ? Prostate cancer [facility]) July 03, 2010  ? treated with radiation therapy. Prostate  ? PSVT (paroxysmal supraventricular tachycardia) ([place])   ? a. 06/2019 Zio: Avg rate 74 (54-120), occas PACs, rare PVCs, 125 episodes of PVCs (longest 17.5 secs; max rate 187). No afib.  ? Squamous cell carcinoma of skin 07/18/2017  ? Left medial calf. KA type  ? Squamous cell carcinoma of skin 04/24/2018  ? Right above med. brow  ? Squamous cell carcinoma of skin 06/11/2019  ? Right posterior shoulder. SCCis, hypertrophic  ? Squamous cell carcinoma of skin 01/09/2020  ? Mid nasal dorsum, MOHS, Efudex x 4wks  ? Stroke [facility])   ? Vasovagal syncope   ? ?  Past Surgical History:  ?Procedure Laterality Date  ? CARDIAC CATHETERIZATION  1998  ? [place] no stents  ? CATARACT EXTRACTION, BILATERAL    ? COLON RESECTION SIGMOID N/A 12/07/2016  ? Procedure: COLON RESECTION SIGMOID;  Surgeon: Clayburn Pert, MD;  Location: [facility] ORS;  Service: General;  Laterality: N/A;  ? COLON SURGERY  11/2016  ? Colostomy  ? COLONOSCOPY  2016  ? COLONOSCOPY WITH PROPOFOL N/A 05/30/2016  ? Procedure:  COLONOSCOPY WITH PROPOFOL;  Surgeon: Lollie Sails, MD;  Location: [facility] ENDOSCOPY;  Service: Endoscopy;  Laterality: N/A;  ? COLOSTOMY Left 12/07/2016  ? Procedure: COLOSTOMY;  Surgeon: Clayburn Pert, MD;  Location: [facility] ORS;  Service: General;  Laterality: Left;  ? COLOSTOMY REVERSAL N/A 03/21/2017  ? Procedure: COLOSTOMY REVERSAL;  Surgeon: Clayburn Pert, MD;  Location: [facility] ORS;  Service: General;  Laterality: N/A;  ? COLOSTOMY TAKEDOWN N/A 03/21/2017  ? Procedure: LAPAROSCOPIC COLOSTOMY TAKEDOWN;  Surgeon: Clayburn Pert, MD;  Location: [facility] ORS;  Service: General;  Laterality: N/A;  ? CYSTOSCOPY WITH STENT PLACEMENT Bilateral 03/21/2017  ? Procedure: CYSTOSCOPY WITH LIGHTED STENT PLACEMENT;  Surgeon: Abbie Sons, MD;  Location: [facility] ORS;  Service: Urology;  Laterality: Bilateral;  ? ESOPHAGOGASTRODUODENOSCOPY    ? ESOPHAGOGASTRODUODENOSCOPY N/A 05/30/2016  ? Procedure: ESOPHAGOGASTRODUODENOSCOPY (EGD);  Surgeon: Lollie Sails, MD;  Location: [facility] ENDOSCOPY;  Service: Endoscopy;  Laterality: N/A;  ? ESOPHAGOGASTRODUODENOSCOPY N/A 01/14/2021  ? Procedure: ESOPHAGOGASTRODUODENOSCOPY (EGD);  Surgeon: Toledo, Benay Pike, MD;  Location: [facility] ENDOSCOPY;  Service: Gastroenterology;  Laterality: N/A;  ? ESOPHAGOGASTRODUODENOSCOPY (EGD) WITH PROPOFOL N/A 04/28/2021  ? Procedure: ESOPHAGOGASTRODUODENOSCOPY (EGD) WITH PROPOFOL;  Surgeon: Toledo, Benay Pike, MD;  Location: [facility] ENDOSCOPY;  Service: Gastroenterology;  Laterality: N/A;  ? EYE SURGERY    ? cataracts  ? FLEXIBLE SIGMOIDOSCOPY N/A 03/21/2017  ? Procedure: FLEXIBLE SIGMOIDOSCOPY;  Surgeon: Clayburn Pert, MD;  Location: [facility] ORS;  Service: General;  Laterality: N/A;  ? FLEXIBLE SIGMOIDOSCOPY N/A 06/24/2021  ? Procedure: FLEXIBLE SIGMOIDOSCOPY;  Surgeon: Jonathon Bellows, MD;  Location: [facility] ENDOSCOPY;  Service: Gastroenterology;  Laterality: N/A;  ? FRACTURE SURGERY Bilateral   ? right arm and left wrist  ? INCISION AND DRAINAGE ABSCESS N/A  12/07/2016  ? Procedure: DRAINAGE  OF INTRA ABDOMINAL ABSCESS;  Surgeon: Clayburn Pert, MD;  Location: [facility] ORS;  Service: General;  Laterality: N/A;  ? LAPAROTOMY N/A 12/07/2016  ? Procedure: EXPLORATORY LAPAROTOMY;  Surgeon: Clayburn Pert, MD;  Location: [facility] ORS;  Service: General;  Laterality: N/A;  ? PROSTATE SURGERY    ? Microwave therapy  ? TONSILLECTOMY    ? TOTAL KNEE ARTHROPLASTY Right 07/27/2016  ? Procedure: TOTAL KNEE ARTHROPLASTY;  Surgeon: [name], MD;  Location: [facility] ORS;  Service: Orthopedics;  Laterality: Right;  Dr. Erlene Quan had to place Urinary catheter due to prostate cancer history.  Using flexible scope.  ? ? ?Current Meds  ?Medication Sig  ? amiodarone (PACERONE) 200 MG tablet Take 1 tablet (200 mg total) by mouth 2 (two) times daily.  ? apixaban (ELIQUIS) 5 MG TABS tablet Take 1 tablet (5 mg total) by mouth 2 (two) times daily.  ? aspirin 81 MG EC tablet Take 81 mg by mouth daily.   ? atorvastatin (LIPITOR) 40 MG tablet TAKE 1 TABLET BY MOUTH EVERY DAY  ? bisacodyl (DULCOLAX) 5 MG EC tablet Take 1 tablet (5 mg total) by mouth daily as needed for moderate constipation.  ? fluticasone (FLONASE) 50 MCG/ACT nasal spray Place 1 spray into both nostrils daily as needed  for allergies.  ? folic acid (FOLVITE) 1 MG tablet Take 1 tablet (1 mg total) by mouth daily.  ? Guaifenesin (MUCINEX MAXIMUM STRENGTH) 1200 MG TB12 Take 1 tablet by mouth daily as needed.  ? hydrocortisone 2.5 % cream APPLY TO AFFECTED AREA TWICE A DAY AS NEEDED FOR RASH  ? meclizine (ANTIVERT) 25 MG tablet Take 25 mg by mouth daily as needed (Vertigo).  ? metoprolol tartrate (LOPRESSOR) 100 MG tablet Take 1 tablet (100 mg total) by mouth 2 (two) times daily.  ? Multiple Vitamin (MULTIVITAMIN WITH MINERALS) TABS tablet Take 1 tablet by mouth daily. (Patient taking differently: Take 1 tablet by mouth daily. Mens daily 50+)  ? pantoprazole (PROTONIX) 40 MG tablet Take 40 mg by mouth 2 (two) times daily.  ? sertraline  (ZOLOFT) 50 MG tablet Take 1 tablet (50 mg total) by mouth daily.  ? vitamin B-12 (CYANOCOBALAMIN) 1000 MCG tablet Take 1,000 mcg by mouth daily.  ? ? ?Allergies: Formaldehyde ? ?Social History  ? ?Tobacco Use  ? Smoking sta

## 2021-07-22 NOTE — Patient Instructions (Addendum)
Medication Instructions:  ? ?Your physician has recommended you make the following change in your medication:  ? ?STOP Amiodarone ? ?*If you need a refill on your cardiac medications before your next appointment, please call your pharmacy* ? ? ?Lab Work: ? ?None ordered ? ?Testing/Procedures: ? ?Your physician has recommended that you wear a Zio monitor for TWO WEEKS.  ?This will be mailed to you. ? ?This monitor is a medical device that records the heart?s electrical activity. Doctors most often use these monitors to diagnose arrhythmias. Arrhythmias are problems with the speed or rhythm of the heartbeat. The monitor is a small device applied to your chest. You can wear one while you do your normal daily activities. While wearing this monitor if you have any symptoms to push the button and record what you felt. Once you have worn this monitor for the period of time provider prescribed (Usually 14 days), you will return the monitor device in the postage paid box. Once it is returned they will download the data collected and provide [place] with a report which the provider will then review and we will call you with those results. Important tips: ? ?Avoid showering during the first 24 hours of wearing the monitor. ?Avoid excessive sweating to help maximize wear time. ?Do not submerge the device, no hot tubs, and no swimming pools. ?Keep any lotions or oils away from the patch. ?After 24 hours you may shower with the patch on. Take brief showers with your back facing the shower head.  ?Do not remove patch once it has been placed because that will interrupt data and decrease adhesive wear time. ?Push the button when you have any symptoms and write down what you were feeling. ?Once you have completed wearing your monitor, remove and place into box which has postage paid and place in your outgoing mailbox.  ?If for some reason you have misplaced your box then call our office and we can provide another box and/or mail it off  for you. ? ? ?Follow-Up: ?At [facility], you and your health needs are our priority.  As part of our continuing mission to provide you with exceptional heart care, we have created designated Provider Care Teams.  These Care Teams include your primary Cardiologist (physician) and Advanced Practice Providers (APPs -  Physician Assistants and Nurse Practitioners) who all work together to provide you with the care you need, when you need it. ? ?We recommend signing up for the patient portal called "MyChart".  Sign up information is provided on this After Visit Summary.  MyChart is used to connect with patients for Virtual Visits (Telemedicine).  Patients are able to view lab/test results, encounter notes, upcoming appointments, etc.  Non-urgent messages can be sent to your provider as well.   ?To learn more about what you can do with MyChart, go to [email or web address].   ? ?Your next appointment:   ? ?Early May ? ?The format for your next appointment:   ?In Person ? ?Provider:   ?You may see [name], MD or one of the following Advanced Practice Providers on your designated Care Team:   ?Murray Hodgkins, NP ?Christell Faith, PA-C ?Cadence Kathlen Mody, PA-C ?

## 2021-07-23 ENCOUNTER — Encounter: Payer: Self-pay | Admitting: Internal Medicine

## 2021-07-23 DIAGNOSIS — Z01810 Encounter for preprocedural cardiovascular examination: Secondary | ICD-10-CM | POA: Insufficient documentation

## 2021-07-23 NOTE — Telephone Encounter (Signed)
Requested medications are due for refill today.  yes ? ?Requested medications are on the active medications list.  yes ? ?Last refill. 09/02/2020 #90 3 refills ? ?Future visit scheduled.   yes ? ?Notes to clinic.  Labs are expired. ? ? ? ?Requested Prescriptions  ?Pending Prescriptions Disp Refills  ? atorvastatin (LIPITOR) 40 MG tablet [Pharmacy Med Name: ATORVASTATIN 40 MG TABLET] 90 tablet 3  ?  Sig: TAKE 1 TABLET BY MOUTH EVERY DAY  ?  ? Cardiovascular:  Antilipid - Statins Failed - 07/22/2021 11:38 AM  ?  ?  Failed - Lipid Panel in normal range within the last 12 months  ?  Cholesterol, Total  ?Date Value Ref Range Status  ?08/15/2019 156 100 - 199 mg/dL Final  ? ?LDL Chol Calc (NIH)  ?Date Value Ref Range Status  ?08/15/2019 85 0 - 99 mg/dL Final  ? ?HDL  ?Date Value Ref Range Status  ?08/15/2019 46 >39 mg/dL Final  ? ?Triglycerides  ?Date Value Ref Range Status  ?08/15/2019 144 0 - 149 mg/dL Final  ? ?  ?  ?  Passed - Patient is not pregnant  ?  ?  Passed - Valid encounter within last 12 months  ?  Recent Outpatient Visits   ? ?      ? 2 weeks ago Essential hypertension  ? [facility] [place], Dionne Bucy, MD  ? 2 weeks ago Depression, major, single episode, mild ([place])  ? [place], PA-C  ? 1 month ago Acute recurrent sinusitis, unspecified location  ? [place], PA-C  ? 5 months ago Hospital discharge follow-up  ? [facility] Gwyneth Sprout, FNP  ? 8 months ago Depression, major, single episode, mild ([place])  ? [facility] Bacigalupo, Dionne Bucy, MD  ? ?  ?  ?Future Appointments   ? ?        ? In 2 weeks Gusler, Christin Z, NP [facility]  ? In 1 month Dunn, Areta Haber, PA-C [facility], [facility]  ? In 1 month [name], MD [place]  ? In 2 months Bacigalupo, Dionne Bucy, MD [facility], [facility]  ? ?  ? ?  ?  ?  ?  ?

## 2021-07-26 ENCOUNTER — Encounter: Payer: Self-pay | Admitting: *Deleted

## 2021-07-26 DIAGNOSIS — I4819 Other persistent atrial fibrillation: Secondary | ICD-10-CM

## 2021-07-28 ENCOUNTER — Ambulatory Visit: Payer: Medicare Other | Admitting: Internal Medicine

## 2021-07-29 ENCOUNTER — Telehealth: Payer: Self-pay | Admitting: Internal Medicine

## 2021-07-29 DIAGNOSIS — M1611 Unilateral primary osteoarthritis, right hip: Secondary | ICD-10-CM | POA: Diagnosis not present

## 2021-07-29 NOTE — Telephone Encounter (Signed)
Patient states he was returning call. Please advise ?

## 2021-07-30 ENCOUNTER — Telehealth: Payer: Self-pay | Admitting: Internal Medicine

## 2021-07-30 NOTE — Telephone Encounter (Signed)
New Message: ? ? ? ? ?Patient says he would to talk to [facility]. He says she helps him with getting his medicine. ?

## 2021-08-02 NOTE — Telephone Encounter (Signed)
Spoke with patient and reviewed that it can take a couple of weeks before we hear back from the assistance program for Eliquis. He verbalized understanding with no further questions at this time. ?

## 2021-08-04 ENCOUNTER — Encounter: Payer: Self-pay | Admitting: Internal Medicine

## 2021-08-04 ENCOUNTER — Other Ambulatory Visit: Payer: Self-pay | Admitting: Family Medicine

## 2021-08-04 DIAGNOSIS — I10 Essential (primary) hypertension: Secondary | ICD-10-CM

## 2021-08-04 NOTE — Telephone Encounter (Signed)
Spoke with patient and reviewed he was denied for assistance with Eliquis due to not spending 3% of his income on prescriptions. Reviewed options available and provided him with number to Extra Help with Social Security. He was appreciative with no further questions at this time.  ?

## 2021-08-04 NOTE — Telephone Encounter (Signed)
Incoming fax from [employer].  ? ?Patient is not Eligible to receive Eliquis for the following reasons: ? ?- Documentation of 3% out-of-pocket prescriptions expenses, based on household adjusted gross income, not met.  ?

## 2021-08-04 NOTE — Telephone Encounter (Signed)
I will review with him today when he brings his wife in. Thanks for the update.  ?

## 2021-08-05 NOTE — Telephone Encounter (Signed)
Metoprolol and lisinopril were stopped during hospitalization. Please review what he is actually taking for BP at this time. ?

## 2021-08-05 NOTE — Telephone Encounter (Signed)
Requested medication (s) are due for refill today: no ? ?Requested medication (s) are on the active medication list: Lisinopril is not on the med list. ? ?Last refill:  Lisinopril 06/19/21, Metoprolol 03/04/21,Zoloft 06/08/21.  ? ?Future visit scheduled: yes, pharmacy visit ? ?Notes to clinic:  Unable to refill per protocol, cannot delegate. Lisinopril and Metoprolol were discontinued 07/03/21. ? ? ?  ?Requested Prescriptions  ?Pending Prescriptions Disp Refills  ? lisinopril (ZESTRIL) 40 MG tablet [Pharmacy Med Name: LISINOPRIL 40 MG TABLET] 90 tablet 0  ?  Sig: TAKE 1 TABLET BY MOUTH EVERY DAY  ?  ? Cardiovascular:  ACE Inhibitors Failed - 08/04/2021  8:01 PM  ?  ?  Failed - Cr in normal range and within 180 days  ?  Creat  ?Date Value Ref Range Status  ?08/12/2015 1.40 (H) 0.70 - 1.18 mg/dL Final  ? ?Creatinine, Ser  ?Date Value Ref Range Status  ?07/15/2021 1.47 (H) 0.76 - 1.27 mg/dL Final  ?  ?  ?  ?  Passed - K in normal range and within 180 days  ?  Potassium  ?Date Value Ref Range Status  ?07/15/2021 3.9 3.5 - 5.2 mmol/L Final  ?  ?  ?  ?  Passed - Patient is not pregnant  ?  ?  Passed - Last BP in normal range  ?  BP Readings from Last 1 Encounters:  ?07/22/21 130/80  ?  ?  ?  ?  Passed - Valid encounter within last 6 months  ?  Recent Outpatient Visits   ? ?      ? 3 weeks ago Essential hypertension  ? [facility] [place], Dionne Bucy, MD  ? 1 month ago Depression, major, single episode, mild ([place])  ? [place], PA-C  ? 1 month ago Acute recurrent sinusitis, unspecified location  ? [employer], Dani Gobble, PA-C  ? 6 months ago Hospital discharge follow-up  ? [facility] Gwyneth Sprout, FNP  ? 8 months ago Depression, major, single episode, mild ([place])  ? [facility] Bacigalupo, Dionne Bucy, MD  ? ?  ?  ?Future Appointments   ? ?        ? In 5 days Gusler, Christin Z, NP [facility]  ? In 2 weeks Dunn, Areta Haber,  PA-C [facility], [facility]  ? In 1 month [name], MD [place]  ? In 2 months Bacigalupo, Dionne Bucy, MD [facility], [facility]  ? ?  ? ? ?  ?  ?  ? metoprolol succinate (TOPROL-XL) 50 MG 24 hr tablet [Pharmacy Med Name: METOPROLOL SUCC ER 50 MG TAB] 90 tablet 1  ?  Sig: TAKE 1 TABLET BY MOUTH EVERY DAY WITH OR IMMEDIATELY FOLLOWING A MEAL  ?  ? Cardiovascular:  Beta Blockers Passed - 08/04/2021  8:01 PM  ?  ?  Passed - Last BP in normal range  ?  BP Readings from Last 1 Encounters:  ?07/22/21 130/80  ?  ?  ?  ?  Passed - Last Heart Rate in normal range  ?  Pulse Readings from Last 1 Encounters:  ?07/22/21 (!) 58  ?  ?  ?  ?  Passed - Valid encounter within last 6 months  ?  Recent Outpatient Visits   ? ?      ? 3 weeks ago Essential hypertension  ? [facility] Bacigalupo, Dionne Bucy, MD  ?  1 month ago Depression, major, single episode, mild ([place])  ? [place], PA-C  ? 1 month ago Acute recurrent sinusitis, unspecified location  ? [employer], Dani Gobble, PA-C  ? 6 months ago Hospital discharge follow-up  ? [facility] Gwyneth Sprout, FNP  ? 8 months ago Depression, major, single episode, mild ([place])  ? [facility] Bacigalupo, Dionne Bucy, MD  ? ?  ?  ?Future Appointments   ? ?        ? In 5 days Gusler, Christin Z, NP [facility]  ? In 2 weeks Dunn, Areta Haber, PA-C [facility], [facility]  ? In 1 month [name], MD [place]  ? In 2 months Bacigalupo, Dionne Bucy, MD [facility], [facility]  ? ?  ? ? ?  ?  ?  ? sertraline (ZOLOFT) 25 MG tablet [Pharmacy Med Name: SERTRALINE HCL 25 MG TABLET] 30 tablet 1  ?  Sig: Take 1 tablet (25 mg total) by mouth daily.  ?  ? Not Delegated - Psychiatry:  Antidepressants - SSRI - sertraline Failed - 08/04/2021  8:01 PM  ?  ?  Failed - This refill cannot be delegated  ?  ?  Passed - AST in normal range and within  360 days  ?  AST  ?Date Value Ref Range Status  ?07/15/2021 18 0 - 40 IU/L Final  ?  ?  ?  ?  Passed - ALT in normal range and within 360 days  ?  ALT  ?Date Value Ref Range Status  ?07/15/2021 12 0 - 44 IU/L Final  ?  ?  ?  ?  Passed - Completed PHQ-2 or PHQ-9 in the last 360 days  ?  ?  Passed - Valid encounter within last 6 months  ?  Recent Outpatient Visits   ? ?      ? 3 weeks ago Essential hypertension  ? [facility] [place], Dionne Bucy, MD  ? 1 month ago Depression, major, single episode, mild ([place])  ? [place], PA-C  ? 1 month ago Acute recurrent sinusitis, unspecified location  ? [employer], Dani Gobble, PA-C  ? 6 months ago Hospital discharge follow-up  ? [facility] Gwyneth Sprout, FNP  ? 8 months ago Depression, major, single episode, mild ([place])  ? [facility] Bacigalupo, Dionne Bucy, MD  ? ?  ?  ?Future Appointments   ? ?        ? In 5 days Gusler, Christin Z, NP [facility]  ? In 2 weeks Dunn, Areta Haber, PA-C [facility], [facility]  ? In 1 month [name], MD [place]  ? In 2 months Bacigalupo, Dionne Bucy, MD [facility], [facility]  ? ?  ? ? ?  ?  ?  ? ? ?

## 2021-08-06 ENCOUNTER — Ambulatory Visit: Payer: Medicare Other | Admitting: Internal Medicine

## 2021-08-10 ENCOUNTER — Telehealth: Payer: Self-pay | Admitting: Nurse Practitioner

## 2021-08-10 ENCOUNTER — Telehealth: Payer: Medicare Other | Admitting: Nurse Practitioner

## 2021-08-10 ENCOUNTER — Encounter: Payer: Medicare Other | Admitting: Nurse Practitioner

## 2021-08-10 ENCOUNTER — Telehealth: Payer: Self-pay | Admitting: Internal Medicine

## 2021-08-10 ENCOUNTER — Encounter: Payer: Self-pay | Admitting: Nurse Practitioner

## 2021-08-10 DIAGNOSIS — R5381 Other malaise: Secondary | ICD-10-CM | POA: Diagnosis not present

## 2021-08-10 DIAGNOSIS — M169 Osteoarthritis of hip, unspecified: Secondary | ICD-10-CM | POA: Diagnosis not present

## 2021-08-10 DIAGNOSIS — I639 Cerebral infarction, unspecified: Secondary | ICD-10-CM

## 2021-08-10 DIAGNOSIS — G8929 Other chronic pain: Secondary | ICD-10-CM | POA: Diagnosis not present

## 2021-08-10 DIAGNOSIS — Z515 Encounter for palliative care: Secondary | ICD-10-CM | POA: Diagnosis not present

## 2021-08-10 NOTE — Telephone Encounter (Signed)
I connected with my chart video for f/u pc visit, no response. I called Cameron Foster for my chart video, no answer, message left with contact information to call to rescedule ?

## 2021-08-10 NOTE — Telephone Encounter (Signed)
Called and spoke with pt. Pt states he did not check BP yesterday, but started having headache yesterday. Reports HA felt like "sinus headache" yesterday. Pt took his Metoprolol this morning at 7:30 am. Reports BP reading was before he took his Metoprolol. Pt does still have HA at this time. Denies blurred vision, weakness, and no other s/s.  ? ?Advised pt recheck BP while on the phone now (9:00 am).  ?BP 120/68 ? ?Advised pt to continue to monitor BP ~2 hrs after am medication. Let us know of any changes. Pt voiced understanding.  ? ?Pt proceeded to report bruise on arm approx 2x3 in. No bleeding. Pt concerned since he is on Eliquis.  ?Advised pt to monitor bruise, if continues to get larger or develop pain he may have assessed at PCP or urgent care. Otherwise, advised pt incr in bruising is common while on blood thinner. Verified pt does not have any other s/s of bleeding. Denies hematuria, dark stools, nose bleeds, etc.  ? ?Pt then asks about Xarelto samples for his wife. Telephone encounter started for further detail for pt's wife.  ?

## 2021-08-10 NOTE — Telephone Encounter (Signed)
Patient called back in regarding both him and his wife. He reports blood pressure is better. Advised to only monitor 2 times a day and to make sure it is 2 hours after medications. He verbalized understanding with no further questions at this time.  ?

## 2021-08-10 NOTE — Progress Notes (Signed)
? ? ?Manufacturing engineer ?Community Palliative Care Consult Note ?Telephone: [phone]  ?Fax: [phone]  ? ? ?Date of encounter: 08/10/21 ?3:29 PM ?PATIENT NAME: [name] ?[address] ?[place] [address]   ?[phone] (home)  ?DOB: 1940-11-16 ?MRN: [id number] ?PRIMARY CARE PROVIDER:    ?[name], MD,  ?[address] ?[place] [address] ?[phone] ?RESPONSIBLE PARTY:    ?Contact Information   ? ? Name Relation Home Work Mobile  ? [place] Spouse [phone]  [phone]  ? [place],Lisa Daughter [phone]  [phone]  ? ?  ? ?Due to the COVID-19 crisis, this visit was done via telemedicine from my office and it was initiated and consent by this patient and or family. ? ?I connected with  [name] OR PROXY on 08/10/21 by a video enabled telemedicine application and verified that I am speaking with the correct person using two identifiers. ?  ?I discussed the limitations of evaluation and management by telemedicine. The patient expressed understanding and agreed to proceed. Palliative Care was asked to follow this patient by consultation request of  Bacigalupo, Dionne Bucy, MD to address advance care planning and complex medical decision making. This is a follow up visit.                                  ?ASSESSMENT AND PLAN / RECOMMENDATIONS:  ?Symptom Management/Plan: ?1. Advance Care Planning;  ongoing discussion wishes to revisit with in person visit.  ?  ?2. Debility secondary to late onset CVA; discussed at length about mobility, ADL's, planning for next phase of his life,  ?  ?3. Chronic pain due to OA hip with pending sgy for hip with orthopedic which is scheduled for 06/28/2021 total hip arthro for which Mr. Weatherbee did not have due to recent hospitalization for afib; on anticoagulation hip sgy was cancelled to revisit at a later time. Continue current regimen has been receiving relief. We talked about functional debility. We  talked about Medical goals, further planning LTC, Mr. Baller endorses he did not remember talking with [place] with PC, will have [place] re-contact to re-visit his questions about how to get his affairs in order and prepare when something happens to he or his wife.  ?  ?4. Palliative care encounter; Palliative care encounter; Palliative medicine team will continue to support patient, patient's family, and medical team. Visit consisted of counseling and education dealing with the complex and emotionally intense issues of symptom management and palliative care in the setting of serious and potentially life-threatening illness ? ?Follow up Palliative Care Visit: Palliative care will continue to follow for complex medical decision making, advance care planning, and clarification of goals. Return 8 weeks or prn. ? ?I spent 32 minutes providing this consultation. More than 50% of the time in this consultation was spent in counseling and care coordination. ? ?PPS: 60% ? ?Chief Complaint: Follow up palliative consult for complex medical decision making ? ?HISTORY OF PRESENT ILLNESS:  Jaiyden Laur. is a 81 y.o. year old male  with multiple medial problems including SAH (2020), CVA (lucunar infarct), atrial fib, HTN, chronic pansinusitis, h/o gastric ulcer with hemorrhage, IBS, h/o h.pylori, IBS, GERD, foraminal stenosis of lumbar region, cervical region, chronic low back pain, HLD, OA left hip. Mr. Goh and I connected for telemedicine visit. Mr. Makris continues to be independent with ADL's, living at home with his  wife. We talked about purpose of PC visit, ROS, symptoms including bowel patterns, sleep patterns. Mr. Langille endorses he does get up frequently during the night to void. We talked abot appetite, nutrition. We talked about no recent falls, infections. Recent hospitalization from 06/22/2021 to 07/03/2021 with left sided colitis, paroxysmal atrial fib, acute systolic CHF, acute blood loss anemia, moderate  alcohol dependence. He was continued home on Eliquis. We talked about role pc in poc. Medical goals reviewed. We talked about f/u PC visit, will schedule in 8 weeks. Mr. Banner talked at length about fears and concerns for long term care planning, requesting to meet with PC SW, will ask PC SW to contact to re-visit. Therapeutic listening, emotional support provided. Questions answered.  ? ?History obtained from review of EMR, discussion with Mr. Rathgeber.  ?I reviewed available labs, medications, imaging, studies and related documents from the EMR.  Records reviewed and summarized above.  ? ?ROS ?10 point system reviewed negative except HPI ?Physical Exam: ?deferred ? ?Thank you for the opportunity to participate in the care of Mr. Pevey.  The palliative care team will continue to follow. Please call our office at [phone] if we can be of additional assistance.  ? ?Nirvaan Frett Z Keena Heesch, NP  ?  ?

## 2021-08-10 NOTE — Telephone Encounter (Signed)
Pt c/o BP issue: STAT if pt c/o blurred vision, one-sided weakness or slurred speech ? ?1. What are your last 5 BP readings? 195/103 this morning  ? ?2. Are you having any other symptoms (ex. Dizziness, headache, blurred vision, passed out)? Headache  ? ?3. What is your BP issue? Hypertension started yesterday.   ?

## 2021-08-12 DIAGNOSIS — R519 Headache, unspecified: Secondary | ICD-10-CM | POA: Diagnosis not present

## 2021-08-12 DIAGNOSIS — K5900 Constipation, unspecified: Secondary | ICD-10-CM | POA: Diagnosis not present

## 2021-08-12 DIAGNOSIS — I10 Essential (primary) hypertension: Secondary | ICD-10-CM | POA: Diagnosis not present

## 2021-08-13 DIAGNOSIS — I4819 Other persistent atrial fibrillation: Secondary | ICD-10-CM | POA: Diagnosis not present

## 2021-08-13 NOTE — Progress Notes (Signed)
PC SW placed follow up TC to patient to address needs. ? ?Patient shared that he received the resource information that SW mailed to him and was needing some assistance reaching one in particular,[employer], patient shared he has been calling and leaving messages with no return call. SW offered to also [employer] on patient behalf. ? ?Patient shared that he contacted his LTC policy and was told that it would only cover SNF payment assistance and not in home care assistance. Patient denied needing private caregivers in the home for he or his spouse at this time. SW offered to connect patient with a private caregiver should he feel their needs have changed. Patient appreciative of offer and states will let SW know if he needs in home assistance. At present he feels he only needs assistance from time to time with handy man wok I.e changing light bulbs, switching batteries in smoke detector, cleaning gutters etc.  ? ? ?SW [employer] at [phone]- [place] and LVM as well as sent a follow up email to their email address at both [email or web address] and [email or web address]. Patient was also cc'd to this email, per his request.  ? ?Patient denied any other needs or concerns at this time with SW.  ?

## 2021-08-16 ENCOUNTER — Encounter: Payer: Self-pay | Admitting: Internal Medicine

## 2021-08-17 ENCOUNTER — Telehealth: Payer: Self-pay | Admitting: *Deleted

## 2021-08-17 ENCOUNTER — Ambulatory Visit (INDEPENDENT_AMBULATORY_CARE_PROVIDER_SITE_OTHER): Payer: Medicare Other

## 2021-08-17 DIAGNOSIS — I10 Essential (primary) hypertension: Secondary | ICD-10-CM

## 2021-08-17 DIAGNOSIS — K5900 Constipation, unspecified: Secondary | ICD-10-CM

## 2021-08-17 NOTE — Progress Notes (Signed)
? ?Chronic Care Management ?Pharmacy Note ? ?08/17/2021 ?Name:  Cameron Foster. MRN:  [id number] DOB:  07-23-1940 ? ?Summary: ?Patient presents for CCM follow-up. Lisinopril recently started, patient tolerating well but has not been monitoring. Feels headaches have improved. Eliquis was started.  ? ?Recommendations/Changes made from today's visit: ?-Advised patient to resume monitoring blood pressure, recheck BMP in one week.  ? ?Plan: ?CPP follow-up 3 months ? ?Subjective: ?Cameron Foster. is an 81 y.o. year old male who is a primary patient of Bacigalupo, Dionne Bucy, MD.  The CCM team was consulted for assistance with disease management and care coordination needs.   ? ?Engaged with patient by telephone for follow up visit in response to provider referral for pharmacy case management and/or care coordination services.  ? ?Consent to Services:  ?The patient was given information about Chronic Care Management services, agreed to services, and gave verbal consent prior to initiation of services.  Please see initial visit note for detailed documentation.  ? ?Patient Care Team: ?[name], MD as PCP - General (Family Medicine) ?[name], MD as PCP - Cardiology (Cardiology) ?[name], MD as Consulting Physician (Cardiology) ?[name], MD (Orthopedic Surgery) ?Schnier, Dolores Lory, MD (Vascular Surgery) ?[name], MD (Dermatology) ?[name], MD as Consulting Physician (Ophthalmology) ?[name], [facility] (Pharmacist) ? ?Recent office visits: ? ?07/09/21: patient presented to [name] for hospital follow-up.  ?07/06/21: Patient presented to [facility], PA-C for follow-up. Docusate, hydrocortisone, melatonine, Miralax, thiamine stopped.  ? ? ?05/24/21: Patient presented to Kirke Shaggy, LPN for AWV.  ?05/12/21: Sertraline decreased to 25 mg daily.  ?01/30/2021 [name] MD (PCP) AMB referral to community care coordination ?01/26/2021 [name] (PCP office) start  guaiFENesin-dextromethorphan 100-10 mg/74m prn, start Prednisone 20 mg daily, change Meclizine 12.5 mg 3 time daily, start Zyrtec/Claritin by mouth, daily,Ambulatory referral to ENT ? ?Recent consult visits: ?08/17/21: Dr. ESaunders Revelrecommended stopping Eliquis, starting Aspirin 81 mg daily.  ?08/12/21: Patient presented to Dr. LDeloria Lairfor headache. BP 190/113. Lisinopril 10 mg daily.  ?07/22/21: Patient presented to Dr. ESaunders Revelfor follow-up. Srop amiodarone.  ?12/09/2020 [name] (Cardiology) No medication changes noted, Return to clinic in 1 year ?12/07/2020 [name] (Vascular Surgery) No Medication Changes noted,  Return in about 6 months  ?09/09/2020 [name] (Urology) start Sildenafil Citrate 20 mg daily ? ? ?Hospital visits: ?06/22/21-07/03/21: Hospitalized due to colitis. Abx 3/8-3/14. Eliquis 5 mg twice daily. Amiodarone 200 mg twice daily. Stop amlodipine, furosemide, lisinopril,tramadol.  ? ?Admitted to the hospital on 01/13/2021 due to Melena. Discharge date was [facility]/14/2021. [facility]   ?  ?[facility] ?[facility] None ?  ?Medication Changes at Hospital Discharge: ?-Changed none ?  ?Medications Discontinued at Hospital Discharge: ?-Stopped meloxicam ,stop omeprazole ?  ?Medications that remain the same after Hospital Discharge:??  ?-All other medications will remain the same.   ?  ?Objective: ? ?Lab Results  ?Component Value Date  ? CREATININE 1.47 (H) 07/15/2021  ? BUN 12 07/15/2021  ? GFR 57.85 (L) 02/21/2018  ? GFRNONAA 45 (L) 07/03/2021  ? GFRAA 62 08/15/2019  ? NA 140 07/15/2021  ? K 3.9 07/15/2021  ? CALCIUM 9.4 07/15/2021  ? CO2 23 07/15/2021  ? GLUCOSE 130 (H) 07/15/2021  ? ? ?Lab Results  ?Component Value Date/Time  ? HGBA1C 5.4 09/24/2020 03:12 PM  ? HGBA1C 5.6 06/09/2019 05:27 AM  ? HGBA1C 5.3 10/11/2018 12:00 AM  ?  GFR 57.85 (L) 02/21/2018 11:56 AM  ? GFR 61.28 04/19/2017 03:50 PM  ?  ?Last diabetic Eye exam: No results  found for: HMDIABEYEEXA  ?Last diabetic Foot exam: No results found for: HMDIABFOOTEX  ? ?Lab Results  ?Component Value Date  ? CHOL 156 08/15/2019  ? HDL 46 08/15/2019  ? [place] 85 08/15/2019  ? TRIG 144 08/15/2019  ? CHOLHDL 4.4 06/09/2019  ? ? ? ?  Latest Ref Rng & Units 07/15/2021  ?  3:16 PM 06/21/2021  ?  8:35 PM 01/13/2021  ?  3:22 PM  ?Hepatic Function  ?Total Protein 6.0 - 8.5 g/dL 6.4   7.6   6.8    ?Albumin 3.7 - 4.7 g/dL 3.7   3.9   3.6    ?AST 0 - 40 IU/L 18   26   19     ?ALT 0 - 44 IU/L 12   13   15     ?Alk Phosphatase 44 - 121 IU/L 80   79   60    ?Total Bilirubin 0.0 - 1.2 mg/dL 0.3   0.7   0.7    ? ? ?Lab Results  ?Component Value Date/Time  ? TSH 3.077 06/28/2021 04:26 AM  ? TSH 5.605 (H) 10/20/2018 09:46 PM  ? TSH 1.88 01/20/2015 11:22 AM  ? FREET4 0.66 10/21/2018 01:29 PM  ? ? ? ?  Latest Ref Rng & Units 07/15/2021  ?  3:16 PM 07/03/2021  ?  6:14 AM 07/01/2021  ?  4:32 AM  ?CBC  ?WBC 3.4 - 10.8 x10E3/uL 9.0   10.4   11.3    ?Hemoglobin 13.0 - 17.7 g/dL 10.7   10.5   10.8    ?Hematocrit 37.5 - 51.0 % 33.1   33.4   33.9    ?Platelets 150 - 450 x10E3/uL 310   377   347    ? ? ?No results found for: VD25OH ? ?Clinical ASCVD: Yes  ?The ASCVD Risk score (Arnett DK, et al., 2019) failed to calculate for the following reasons: ?  The 2019 ASCVD risk score is only valid for ages 31 to 35 ?  The patient has a prior MI or stroke diagnosis   ? ? ?  07/06/2021  ?  1:10 PM 06/08/2021  ?  3:04 PM 05/24/2021  ?  1:59 PM  ?Depression screen PHQ 2/9  ?Decreased Interest 1 3 0  ?Down, Depressed, Hopeless 1 2 0  ?PHQ - 2 Score 2 5 0  ?Altered sleeping 3 1   ?Tired, decreased energy 3 3   ?Change in appetite 0 0   ?Feeling bad or failure about yourself  0 0   ?Trouble concentrating 0 0   ?Moving slowly or fidgety/restless 0 0   ?Suicidal thoughts 0 0   ?PHQ-9 Score 8 9   ?Difficult doing work/chores Not difficult at all Extremely dIfficult   ?  ?Social History  ? ?Tobacco Use  ?Smoking Status Former  ? Packs/day: 1.00  ?  Years: 27.00  ? Pack years: 27.00  ? Types: Cigarettes  ? Quit date: 04/18/1988  ? Years since quitting: 33.3  ?Smokeless Tobacco Current  ? Types: Chew  ? ?BP Readings from Last 3 Encounters:  ?07/22/21 130/80  ?07/03/21 124/68  ?06/16/21 132/83  ? ?Pulse Readings from Last 3 Encounters:  ?07/22/21 (!) 58  ?07/09/21 68  ?07/03/21 66  ? ?Wt Readings from Last 3 Encounters:  ?07/22/21 204 lb 6 oz (92.7 kg)  ?06/24/21 200  lb (90.7 kg)  ?06/16/21 211 lb (95.7 kg)  ? ?BMI Readings from Last 3 Encounters:  ?07/22/21 30.62 kg/m?  ?06/24/21 30.41 kg/m?  ?06/16/21 32.08 kg/m?  ? ? ?Assessment/Interventions: Review of patient past medical history, allergies, medications, health status, including review of consultants reports, laboratory and other test data, was performed as part of comprehensive evaluation and provision of chronic care management services.  ? ?SDOH:  (Social Determinants of Health) assessments and interventions performed: Yes ? ? ? ? ?SDOH Screenings  ? ?Alcohol Screen: Low Risk   ? Last Alcohol Screening Score (AUDIT): 4  ?Depression (PHQ2-9): Medium Risk  ? PHQ-2 Score: 8  ?Financial Resource Strain: Low Risk   ? Difficulty of Paying Living Expenses: Not hard at all  ?Food Insecurity: No Food Insecurity  ? Worried About Charity fundraiser in the Last Year: Never true  ? Ran Out of Food in the Last Year: Never true  ?Housing: Low Risk   ? Last Housing Risk Score: 0  ?Physical Activity: Inactive  ? Days of Exercise per Week: 0 days  ? Minutes of Exercise per Session: 0 min  ?Social Connections: Moderately Integrated  ? Frequency of Communication with Friends and Family: More than three times a week  ? Frequency of Social Gatherings with Friends and Family: Once a week  ? Attends Religious Services: More than 4 times per year  ? Active Member of Clubs or Organizations: No  ? Attends Archivist Meetings: Never  ? Marital Status: Married  ?Stress: No Stress Concern Present  ? Feeling of Stress : Only a  little  ?Tobacco Use: High Risk  ? Smoking Tobacco Use: Former  ? Smokeless Tobacco Use: Current  ? Passive Exposure: Not on file  ?Transportation Needs: No Transportation Needs  ? Lack of Transportation (Me

## 2021-08-17 NOTE — Telephone Encounter (Signed)
-----   Message from [name], MD sent at 08/16/2021  5:05 PM EDT ----- ?Please let Cameron Foster know that I have reviewed his event monitor.  Occasional extra beats were identified, though there was no evidence of atrial fibrillation or atrial flutter.  Given his elevated risk for bleeding and lack of further atrial fibrillation outside of his recent hospitalization with colitis/GI bleeding, I recommend discontinuation of apixaban and reinitiation of aspirin 81 mg daily.  He should let us know if any questions or concerns arise. ?

## 2021-08-17 NOTE — Telephone Encounter (Signed)
Called and spoke with pt. Notified of event monitor results and Dr. Darnelle Bos recc.  ?Pt voiced understanding. Pt will:  ? ?-  STOP apixaban ?-  RESTART Aspirin 81 mg daily  ? ?Pt has no further questions at this time.  ?Med list updated.  ?

## 2021-08-17 NOTE — Patient Instructions (Signed)
Visit Information ?It was great speaking with you today!  Please let me know if you have any questions about our visit. ? ? Goals Addressed   ? ?  ?  ?  ?  ? This Visit's Progress  ?  Manage My Medicine   On track  ?  Timeframe:  Long-Range Goal ?Priority:  High ?Start Date: 02/25/2021                            ?Expected End Date: 02/25/2022                     ? ?Follow Up within 90 days ?  ?- call for medicine refill 2 or 3 days before it runs out ?- keep a list of all the medicines I take; vitamins and herbals too ?- use a pillbox to sort medicine  ?  ?Why is this important?   ?These steps will help you keep on track with your medicines. ?  ?Notes:  ?  ? ?  ? ? ?Patient Care Plan: General Pharmacy (Adult)  ?  ? ?Problem Identified: Hypertension, Hyperlipidemia, Atrial Fibrillation, Chronic Kidney Disease, Depression, Osteoarthritis, and History of TIA, CVA   ?Priority: High  ?  ? ?Long-Range Goal: Patient-Specific Goal   ?Start Date: 02/25/2021  ?Expected End Date: 02/25/2022  ?This Visit's Progress: On track  ?Recent Progress: On track  ?Priority: High  ?Note:   ?Current Barriers:  ?Unable to self administer medications as prescribed ? ?Pharmacist Clinical Goal(s):  ?Patient will maintain control of blood pressure as evidenced by BP less than 140/90  through collaboration with PharmD and provider.  ? ?Interventions: ?1:1 collaboration with Brita Romp, Dionne Bucy, MD regarding development and update of comprehensive plan of care as evidenced by provider attestation and co-signature ?Inter-disciplinary care team collaboration (see longitudinal plan of care) ?Comprehensive medication review performed; medication list updated in electronic medical record ? ?Hypertension (BP goal <140/90) ?-Controlled ?-Current treatment: ?Metoprolol tartrate 100 mg twice daily: Appropriate, Effective, Safe, Accessible  ?Lisinopril 10 mg daily: Appropriate, Effective, Safe, Accessible  ?-Medications previously tried: Atenolol,  Bisoprolol, nebivolol, amlodipine, lisinopril  ?Lisinopril recently started, patient tolerating well but has not been monitoring. Feels headaches have improved.  ?-Current home readings: Has not been monitoring ?-Current dietary habits: Eats what I want to eat. 1 cup of coffee. Ocassional sweet tea or regular soda. Eats out large meal once weekly. Does salt food.  ?-Current exercise habits: None ?-Denies hypotensive/hypertensive symptoms, but does not significant fatigue throughout the day.  ?-Advised patient to resume monitoring blood pressure, recheck BMP in one week.  ?-Recommended to continue current medication ? ?Hyperlipidemia: (LDL goal < 70) ?-Uncontrolled ?-History of TIA, CVA  ?-Current treatment: ?Atorvastatin 40 mg daily (AM) ?-Medications previously tried: NA  ?-Recommended to continue current medication ? ?Depression (Goal: Maintain symptom remission) ?-Controlled ?-Current treatment: ?Sertraline 50 mg daily  ?-Medications previously tried/failed: NA ?-PHQ9: 3 ?-Recommended to continue current medication ? ?GERD (Goal: Prevent Reflux) ?-Controlled ?History of stomach ulcer  ?-Current treatment  ?Pantoprazole 40 mg twice daily: Query Appropriate  ?-Medications previously tried: NA  ?-Query appropriate use of twice daily pantoprazole, defer potential changes for now.  ?-Recommended to continue current medication ? ?Chronic Kidney Disease Stage 3a  ?-All medications assessed for renal dosing and appropriateness in chronic kidney disease. ?-Recommended to continue current medication ? ?Patient Goals/Self-Care Activities ?Patient will:  ?- check blood pressure weekly, document, and provide at future  appointments ? ?Follow Up Plan: Telephone follow up appointment with care management team member scheduled for:  11/29/2021 at 11:00 AM ?  ? ?Patient agreed to services and verbal consent obtained.  ? ?Patient verbalizes understanding of instructions and care plan provided today and agrees to view in [place].  Active MyChart status confirmed with patient.   ? ?Junius Argyle, PharmD, BCACP, CPP  ?Clinical Pharmacist Practitioner  ?[place] ?[phone]  ?

## 2021-08-18 ENCOUNTER — Telehealth: Payer: Self-pay

## 2021-08-18 NOTE — Progress Notes (Signed)
This encounter was created in error - please disregard.

## 2021-08-18 NOTE — Progress Notes (Signed)
Per Clinical pharmacist, I had a visit with him yesterday, but he was trying to reach me, can you check in with him please.  ? ?Patient states he had appointment with the clinical pharmacist yesterday , and was recommend for him to have some lab work completed.Patient states he is not sure what lab work he needs to have done, and when he needs to schedule the lab appointment. Notified Clinical Pharmacist. ? ?Per Clinical pharmacist, I have placed the order for his labs. He can come to the [facility] lab this week or next week to have his labs taken. He does not need to be fasting, and he does not need appointment. ? ?Patient Verbalized understanding. ? ?Bessie Kellihan,CPA ?Clinical Pharmacist Assistant ?[phone]  ? ? ? ? ?

## 2021-08-18 NOTE — Addendum Note (Signed)
Addended by: Daron Offer A on: 08/18/2021 04:27 PM ? ? Modules accepted: Orders ? ?

## 2021-08-22 NOTE — Progress Notes (Signed)
? ?Cardiology Office Note   ? ?Date:  08/23/2021  ? ?ID:  [name], DOB 07-Oct-1940, MRN [id number] ? ?PCP:  [name], MD  ?Cardiologist:  [name], MD  ?Electrophysiologist:  None  ? ?Chief Complaint: Follow-up ? ?History of Present Illness:  ? ?Byrl Latin. is a 81 y.o. male with history of recently diagnosed persistent A-fib in the setting of GI bleed from colitis, systolic dysfunction, subarachnoid hemorrhage of uncertain etiology complicated by recent CVA, HTN, HLD, prostate cancer, and alcohol use who presents for follow-up of Zio patch. ? ?He was admitted to the hospital in 06/2021 with left-sided colitis felt to be ischemic versus infectious.  Flexible sigmoidoscopy noted possible ischemic colitis.  He was treated with antibiotics.  His admission was complicated by hypotension and A-fib with RVR.  High-sensitivity troponin 18 with a delta troponin of 14.  BNP 569.  Echo demonstrated an EF of 45 to 50%, global hypokinesis, moderate concentric LVH, elevated left ventricular end-diastolic pressure, normal RV systolic function and ventricular cavity size, normal PASP, mildly dilated left atrium, mild mitral regurgitation, mild calcification of the aortic valve without evidence of stenosis, mildly dilated aortic root and ascending aorta measuring 41 and 39 mm respectively.  He was ultimately placed on IV amiodarone by Dr. Rockey Situ for rate control, which led to pharmacologic cardioversion.  After extensive discussion of risk versus benefits of anticoagulation with the patient and his family, the decision was ultimately made to initiate apixaban. ? ?He was seen in hospital follow-up on 07/22/2021 and was without symptoms of angina or decompensation.  He denied any further GI bleeding.  He remained concerned regarding continuation of apixaban given history of bleeding problems.  There was also noted he had previously been scheduled for a hip arthroplasty, though this was deferred given his  recent hospital admission.  Subsequent outpatient cardiac monitoring showed no evidence of A-fib with multiple brief episodes of paroxysmal SVT as well as 1 short run of NSVT.  Given lack of recurrence of A-fib, and to minimize bleeding risk, OAC was discontinued and he was placed back on aspirin. ? ?He comes in doing well from a cardiac perspective and is without symptoms of angina or decompensation.  No palpitations, dizziness, presyncope, or syncope.  His chronic lower extremity swelling is stable.  No falls, hematochezia, or melena.  Tolerating cardiac medications without issues.  His main limiting factor at this time continues to be significant right hip pain which is drastically affecting his quality of life and functional status.  He would like to move forward with surgical repair when possible.  Outside of his hip pain, he does not have any issues or concerns at this time. ? ?RCRI: Low risk for noncardiac surgery ?[facility] Activity Status Index: Difficult to assess secondary to limitation from her right hip pain ? ? ?Labs independently reviewed: ?06/2021 - BUN 12, serum creatinine 1.47, potassium 3.9, albumin 3.7, AST/ALT normal, Hgb 10.7, PLT 310, magnesium 1.9, digoxin 1.1, TSH normal, BNP 569, high-sensitivity troponin 18 with a delta troponin of 14 ?09/2020 - A1c 5.4 ?07/2019 - TC 156, TG 144, HDL 46, LDL 85 ? ?Past Medical History:  ?Diagnosis Date  ? Alcoholism ([place])   ? Arthritis   ? Atrial fibrillation ([place]) 1995  ? one episode  ? Basal cell carcinoma 04/26/2017  ? Right medial cheek. Superficial and nodular  ? Basal cell carcinoma 06/11/2019  ? Left anterior shoulder. Nodular pattern  ? Basal cell carcinoma 09/09/2019  ?  Right nasal ala, EDC  ? Basal cell carcinoma 02/05/2020  ? L upper eyebrow, EDC   ? Carotid arterial disease ([place])   ? a. 18-Jun-2019 Carotid U/S: <50% bilat ICA stenoses.  ? Chicken pox   ? Colon cancer ([place])   ? Colon polyp   ? Depression   ? Son died Jun 17, 2014  ? Diverticulitis   ? Diverticulosis  30 years  ? Diverticulosis   ? Dysrhythmia   ? Gastritis   ? GERD (gastroesophageal reflux disease)   ? H. pylori infection   ? History of cerebral hemorrhage   ? History of echocardiogram   ? a. 06-18-2019 Echo: EF 55-60%, no rwma, triv MR/AI.  ? History of stress test   ? a. 09/2015 MV: No ischemia/infarct. EF 45-54% (nl by echo).  ? Hyperlipidemia   ? Hypertension   ? Irritable bowel syndrome   ? Prostate cancer [facility]) Jun 17, 2010  ? treated with radiation therapy. Prostate  ? PSVT (paroxysmal supraventricular tachycardia) ([place])   ? a. 06/2019 Zio: Avg rate 74 (54-120), occas PACs, rare PVCs, 125 episodes of PVCs (longest 17.5 secs; max rate 187). No afib.  ? Squamous cell carcinoma of skin 07/18/2017  ? Left medial calf. KA type  ? Squamous cell carcinoma of skin 04/24/2018  ? Right above med. brow  ? Squamous cell carcinoma of skin 06/11/2019  ? Right posterior shoulder. SCCis, hypertrophic  ? Squamous cell carcinoma of skin 01/09/2020  ? Mid nasal dorsum, MOHS, Efudex x 4wks  ? Stroke [facility])   ? Vasovagal syncope   ? ? ?Past Surgical History:  ?Procedure Laterality Date  ? CARDIAC CATHETERIZATION  1998  ? [place] no stents  ? CATARACT EXTRACTION, BILATERAL    ? COLON RESECTION SIGMOID N/A 12/07/2016  ? Procedure: COLON RESECTION SIGMOID;  Surgeon: Clayburn Pert, MD;  Location: [facility] ORS;  Service: General;  Laterality: N/A;  ? COLON SURGERY  11/2016  ? Colostomy  ? COLONOSCOPY  Jun 17, 2014  ? COLONOSCOPY WITH PROPOFOL N/A 05/30/2016  ? Procedure: COLONOSCOPY WITH PROPOFOL;  Surgeon: Lollie Sails, MD;  Location: [facility] ENDOSCOPY;  Service: Endoscopy;  Laterality: N/A;  ? COLOSTOMY Left 12/07/2016  ? Procedure: COLOSTOMY;  Surgeon: Clayburn Pert, MD;  Location: [facility] ORS;  Service: General;  Laterality: Left;  ? COLOSTOMY REVERSAL N/A 03/21/2017  ? Procedure: COLOSTOMY REVERSAL;  Surgeon: Clayburn Pert, MD;  Location: [facility] ORS;  Service: General;  Laterality: N/A;  ? COLOSTOMY TAKEDOWN N/A 03/21/2017  ? Procedure:  LAPAROSCOPIC COLOSTOMY TAKEDOWN;  Surgeon: Clayburn Pert, MD;  Location: [facility] ORS;  Service: General;  Laterality: N/A;  ? CYSTOSCOPY WITH STENT PLACEMENT Bilateral 03/21/2017  ? Procedure: CYSTOSCOPY WITH LIGHTED STENT PLACEMENT;  Surgeon: Abbie Sons, MD;  Location: [facility] ORS;  Service: Urology;  Laterality: Bilateral;  ? ESOPHAGOGASTRODUODENOSCOPY    ? ESOPHAGOGASTRODUODENOSCOPY N/A 05/30/2016  ? Procedure: ESOPHAGOGASTRODUODENOSCOPY (EGD);  Surgeon: Lollie Sails, MD;  Location: [facility] ENDOSCOPY;  Service: Endoscopy;  Laterality: N/A;  ? ESOPHAGOGASTRODUODENOSCOPY N/A 01/14/2021  ? Procedure: ESOPHAGOGASTRODUODENOSCOPY (EGD);  Surgeon: Toledo, Benay Pike, MD;  Location: [facility] ENDOSCOPY;  Service: Gastroenterology;  Laterality: N/A;  ? ESOPHAGOGASTRODUODENOSCOPY (EGD) WITH PROPOFOL N/A 04/28/2021  ? Procedure: ESOPHAGOGASTRODUODENOSCOPY (EGD) WITH PROPOFOL;  Surgeon: Toledo, Benay Pike, MD;  Location: [facility] ENDOSCOPY;  Service: Gastroenterology;  Laterality: N/A;  ? EYE SURGERY    ? cataracts  ? FLEXIBLE SIGMOIDOSCOPY N/A 03/21/2017  ? Procedure: FLEXIBLE SIGMOIDOSCOPY;  Surgeon: Clayburn Pert, MD;  Location: [facility] ORS;  Service: General;  Laterality: N/A;  ?  FLEXIBLE SIGMOIDOSCOPY N/A 06/24/2021  ? Procedure: FLEXIBLE SIGMOIDOSCOPY;  Surgeon: Jonathon Bellows, MD;  Location: [facility] ENDOSCOPY;  Service: Gastroenterology;  Laterality: N/A;  ? FRACTURE SURGERY Bilateral   ? right arm and left wrist  ? INCISION AND DRAINAGE ABSCESS N/A 12/07/2016  ? Procedure: DRAINAGE  OF INTRA ABDOMINAL ABSCESS;  Surgeon: Clayburn Pert, MD;  Location: [facility] ORS;  Service: General;  Laterality: N/A;  ? LAPAROTOMY N/A 12/07/2016  ? Procedure: EXPLORATORY LAPAROTOMY;  Surgeon: Clayburn Pert, MD;  Location: [facility] ORS;  Service: General;  Laterality: N/A;  ? PROSTATE SURGERY    ? Microwave therapy  ? TONSILLECTOMY    ? TOTAL KNEE ARTHROPLASTY Right 07/27/2016  ? Procedure: TOTAL KNEE ARTHROPLASTY;  Surgeon: [name], MD;   Location: [facility] ORS;  Service: Orthopedics;  Laterality: Right;  Dr. Erlene Quan had to place Urinary catheter due to prostate cancer history.  Using flexible scope.  ? ? ?Current Medications: ?Current Meds  ?Med

## 2021-08-23 ENCOUNTER — Encounter: Payer: Self-pay | Admitting: Physician Assistant

## 2021-08-23 ENCOUNTER — Ambulatory Visit (INDEPENDENT_AMBULATORY_CARE_PROVIDER_SITE_OTHER): Payer: Medicare Other | Admitting: Physician Assistant

## 2021-08-23 VITALS — BP 130/90 | HR 60 | Ht 68.0 in | Wt 206.0 lb

## 2021-08-23 DIAGNOSIS — I4819 Other persistent atrial fibrillation: Secondary | ICD-10-CM | POA: Diagnosis not present

## 2021-08-23 DIAGNOSIS — I25119 Atherosclerotic heart disease of native coronary artery with unspecified angina pectoris: Secondary | ICD-10-CM

## 2021-08-23 DIAGNOSIS — Z8673 Personal history of transient ischemic attack (TIA), and cerebral infarction without residual deficits: Secondary | ICD-10-CM | POA: Diagnosis not present

## 2021-08-23 DIAGNOSIS — Z01810 Encounter for preprocedural cardiovascular examination: Secondary | ICD-10-CM | POA: Diagnosis not present

## 2021-08-23 DIAGNOSIS — I519 Heart disease, unspecified: Secondary | ICD-10-CM

## 2021-08-23 DIAGNOSIS — I10 Essential (primary) hypertension: Secondary | ICD-10-CM

## 2021-08-23 NOTE — Patient Instructions (Signed)
Medication Instructions:  ?- Your physician recommends that you continue on your current medications as directed. Please refer to the Current Medication list given to you today. ? ?*If you need a refill on your cardiac medications before your next appointment, please call your pharmacy* ? ? ?Lab Work: ?- none ordered ? ?If you have labs (blood work) drawn today and your tests are completely normal, you will receive your results only by: ?MyChart Message (if you have MyChart) OR ?A paper copy in the mail ?If you have any lab test that is abnormal or we need to change your treatment, we will call you to review the results. ? ? ?Testing/Procedures: ?- none ordered ? ? ?Follow-Up: ?At [facility], you and your health needs are our priority.  As part of our continuing mission to provide you with exceptional heart care, we have created designated Provider Care Teams.  These Care Teams include your primary Cardiologist (physician) and Advanced Practice Providers (APPs -  Physician Assistants and Nurse Practitioners) who all work together to provide you with the care you need, when you need it. ? ?We recommend signing up for the patient portal called "MyChart".  Sign up information is provided on this After Visit Summary.  MyChart is used to connect with patients for Virtual Visits (Telemedicine).  Patients are able to view lab/test results, encounter notes, upcoming appointments, etc.  Non-urgent messages can be sent to your provider as well.   ?To learn more about what you can do with MyChart, go to [email or web address].   ? ?Your next appointment:   ?6 month(s) ? ?The format for your next appointment:   ?In Person ? ?Provider:   ?You may see [name], MD or one of the following Advanced Practice Providers on your designated Care Team:   ? ?Christell Faith, PA-C ?  ? ? ?Other Instructions ?N/a ? ?Important Information About [place] ? ? ? ? ? ? ?

## 2021-08-24 DIAGNOSIS — I110 Hypertensive heart disease with heart failure: Secondary | ICD-10-CM | POA: Diagnosis not present

## 2021-08-25 ENCOUNTER — Ambulatory Visit: Payer: Medicare Other | Admitting: Physician Assistant

## 2021-08-25 LAB — COMPREHENSIVE METABOLIC PANEL
ALT: 12 IU/L (ref 0–44)
AST: 22 IU/L (ref 0–40)
Albumin/Globulin Ratio: 1.4 (ref 1.2–2.2)
Albumin: 4 g/dL (ref 3.7–4.7)
Alkaline Phosphatase: 71 IU/L (ref 44–121)
BUN/Creatinine Ratio: 10 (ref 10–24)
BUN: 14 mg/dL (ref 8–27)
Bilirubin Total: 0.3 mg/dL (ref 0.0–1.2)
CO2: 27 mmol/L (ref 20–29)
Calcium: 9.4 mg/dL (ref 8.6–10.2)
Chloride: 103 mmol/L (ref 96–106)
Creatinine, Ser: 1.37 mg/dL — ABNORMAL HIGH (ref 0.76–1.27)
Globulin, Total: 2.9 g/dL (ref 1.5–4.5)
Glucose: 103 mg/dL — ABNORMAL HIGH (ref 70–99)
Potassium: 4.6 mmol/L (ref 3.5–5.2)
Sodium: 140 mmol/L (ref 134–144)
Total Protein: 6.9 g/dL (ref 6.0–8.5)
eGFR: 52 mL/min/{1.73_m2} — ABNORMAL LOW (ref >59)

## 2021-08-25 LAB — CBC WITH DIFFERENTIAL/PLATELET
Basophils Absolute: 0.1 10*3/uL (ref 0.0–0.2)
Basos: 1 %
EOS (ABSOLUTE): 0.3 10*3/uL (ref 0.0–0.4)
Eos: 4 %
Hematocrit: 34.1 % — ABNORMAL LOW (ref 37.5–51.0)
Hemoglobin: 11 g/dL — ABNORMAL LOW (ref 13.0–17.7)
Immature Grans (Abs): 0 10*3/uL (ref 0.0–0.1)
Immature Granulocytes: 0 %
Lymphocytes Absolute: 1.4 10*3/uL (ref 0.7–3.1)
Lymphs: 19 %
MCH: 29.1 pg (ref 26.6–33.0)
MCHC: 32.3 g/dL (ref 31.5–35.7)
MCV: 90 fL (ref 79–97)
Monocytes Absolute: 0.7 10*3/uL (ref 0.1–0.9)
Monocytes: 9 %
Neutrophils Absolute: 5 10*3/uL (ref 1.4–7.0)
Neutrophils: 67 %
Platelets: 250 10*3/uL (ref 150–450)
RBC: 3.78 x10E6/uL — ABNORMAL LOW (ref 4.14–5.80)
RDW: 12.8 % (ref 11.6–15.4)
WBC: 7.5 10*3/uL (ref 3.4–10.8)

## 2021-08-26 ENCOUNTER — Telehealth: Payer: Self-pay

## 2021-08-26 NOTE — Telephone Encounter (Signed)
Tried calling patient. Left message to call back. OK for [facility] triage to advise.  ?

## 2021-08-26 NOTE — Telephone Encounter (Signed)
-----   Message from [name], MD sent at 08/26/2021 11:33 AM EDT ----- ?Stable labs ?

## 2021-08-26 NOTE — Telephone Encounter (Signed)
Patient aware of results. Patient asked if there's something that he can do to make his RBC be better? Also reports that his hemoglobin is low and that he is not able to take iron because it makes him feel light headed but that he had just tried the regular iron? Please advise. ?

## 2021-08-26 NOTE — Telephone Encounter (Signed)
The hemoglobin is slightly improved actually. Can try low Fe iron instead to see if it sits better.

## 2021-08-26 NOTE — Telephone Encounter (Signed)
Message read to patient. States he has not taken iron "For years." Verified pt stated "Years" replied, "Yes, years, it gave me a headache. Questioning if he should start Fe and at what dosage. ?States may respond in [place]. ?

## 2021-08-27 NOTE — Telephone Encounter (Signed)
The last message made it seem like he was recently taking iron. No need for supplement right now. Last iron levels normal and hemoglobin is stable and only mildly low. ?

## 2021-08-27 NOTE — Telephone Encounter (Signed)
Patient advised as below.  

## 2021-08-30 ENCOUNTER — Telehealth: Payer: PRIVATE HEALTH INSURANCE

## 2021-09-02 ENCOUNTER — Telehealth: Payer: Self-pay

## 2021-09-02 NOTE — Telephone Encounter (Signed)
237 pm.  Message received from patient requesting a call back.  Return call made.  No answer.  Message left with contact information.

## 2021-09-04 ENCOUNTER — Other Ambulatory Visit: Payer: Self-pay | Admitting: Family Medicine

## 2021-09-08 ENCOUNTER — Telehealth: Payer: Self-pay

## 2021-09-08 NOTE — Telephone Encounter (Signed)
PC SW returned patient Cameron Foster.  Patient shared that he wishes to change to his in person PC visit with NP  C. Gusler, to a MyChart visit instead. Patient would like to keep same day and time of 5/31 @1pm . SW made NP aware.   Patient shared that he is expected to have hip surgery done 09/27/21. Per patient this is to be a quick surgery and MD's expect him to be up and walking same day. SW inquired about in home support for patient and spouse during and after surgery. Patient shared that he does not think they will need this, as he will be discharged the same day or day after. SW encouraged patient to have a back up plan for family or friends to be available just in case.

## 2021-09-14 ENCOUNTER — Ambulatory Visit (INDEPENDENT_AMBULATORY_CARE_PROVIDER_SITE_OTHER): Payer: Medicare Other | Admitting: Dermatology

## 2021-09-14 DIAGNOSIS — L853 Xerosis cutis: Secondary | ICD-10-CM | POA: Diagnosis not present

## 2021-09-14 DIAGNOSIS — I25119 Atherosclerotic heart disease of native coronary artery with unspecified angina pectoris: Secondary | ICD-10-CM | POA: Diagnosis not present

## 2021-09-14 DIAGNOSIS — L738 Other specified follicular disorders: Secondary | ICD-10-CM | POA: Diagnosis not present

## 2021-09-14 DIAGNOSIS — I781 Nevus, non-neoplastic: Secondary | ICD-10-CM | POA: Diagnosis not present

## 2021-09-14 DIAGNOSIS — L570 Actinic keratosis: Secondary | ICD-10-CM | POA: Diagnosis not present

## 2021-09-14 DIAGNOSIS — L821 Other seborrheic keratosis: Secondary | ICD-10-CM

## 2021-09-14 DIAGNOSIS — L578 Other skin changes due to chronic exposure to nonionizing radiation: Secondary | ICD-10-CM | POA: Diagnosis not present

## 2021-09-14 DIAGNOSIS — Z85828 Personal history of other malignant neoplasm of skin: Secondary | ICD-10-CM | POA: Diagnosis not present

## 2021-09-14 NOTE — Patient Instructions (Addendum)
Recommend starting moisturizer with exfoliant (Urea, Salicylic acid, or Lactic acid) one to two times daily to help smooth rough and bumpy skin.  OTC options include Cetaphil Rough and Bumpy lotion (Urea), Eucerin Roughness Relief lotion or spot treatment cream (Urea), CeraVe SA lotion/cream for Rough and Bumpy skin (Sal Acid), Gold Bond Rough and Bumpy cream (Sal Acid), and AmLactin 12% lotion/cream (Lactic Acid).  If applying in morning, also apply sunscreen to sun-exposed areas, since these exfoliating moisturizers can increase sensitivity to sun.   If You Need Anything After Your Visit  If you have any questions or concerns for your doctor, please call our main line at [phone] and press option 4 to reach your doctor's medical assistant. If no one answers, please leave a voicemail as directed and we will return your call as soon as possible. Messages left after 4 pm will be answered the following business day.   You may also send [place] a message via [place]. We typically respond to MyChart messages within 1-2 business days.  For prescription refills, please ask your pharmacy to contact our office. Our fax number is [phone].  If you have an urgent issue when the clinic is closed that cannot wait until the next business day, you can page your doctor at the number below.    Please note that while we do our best to be available for urgent issues outside of office hours, we are not available 24/7.   If you have an urgent issue and are unable to reach [place], you may choose to seek medical care at your doctor's office, retail clinic, urgent care center, or emergency room.  If you have a medical emergency, please immediately call 911 or go to the emergency department.  Pager Numbers  - Dr. Nehemiah Massed: [phone]  - Dr. Laurence Ferrari: [phone]  - Dr. Nicole Kindred: [phone]  In the event of inclement weather, please call our main line at [phone] for an update on the status of any delays or  closures.  Dermatology Medication Tips: Please keep the boxes that topical medications come in in order to help keep track of the instructions about where and how to use these. Pharmacies typically print the medication instructions only on the boxes and not directly on the medication tubes.   If your medication is too expensive, please contact our office at [phone] option 4 or send [place] a message through [place].   We are unable to tell what your co-pay for medications will be in advance as this is different depending on your insurance coverage. However, we may be able to find a substitute medication at lower cost or fill out paperwork to get insurance to cover a needed medication.   If a prior authorization is required to get your medication covered by your insurance company, please allow [place] 1-2 business days to complete this process.  Drug prices often vary depending on where the prescription is filled and some pharmacies may offer cheaper prices.  The website www.goodrx.com contains coupons for medications through different pharmacies. The prices here do not account for what the cost may be with help from insurance (it may be cheaper with your insurance), but the website can give you the price if you did not use any insurance.  - You can print the associated coupon and take it with your prescription to the pharmacy.  - You may also stop by our office during regular business hours and pick up a GoodRx coupon card.  - If you need your prescription  sent electronically to a different pharmacy, notify our office through [facility] or by phone at [phone] option 4.     Si Usted Necesita Algo Despus de Su Visita  Tambin puede enviarnos un mensaje a travs de Pharmacist, community. Por lo general respondemos a los mensajes de MyChart en el transcurso de 1 a 2 das hbiles.  Para renovar recetas, por favor pida a su farmacia que se ponga en contacto con nuestra oficina. Harland Dingwall de fax  es [place] [phone].  Si tiene un asunto urgente cuando la clnica est cerrada y que no puede esperar hasta el siguiente da hbil, puede llamar/localizar a su doctor(a) al nmero que aparece a continuacin.   Por favor, tenga en cuenta que aunque hacemos todo lo posible para estar disponibles para asuntos urgentes fuera del horario de [place], no estamos disponibles las 24 horas del da, los 7 das de la [place].   Si tiene un problema urgente y no puede comunicarse con nosotros, puede optar por buscar atencin mdica  en el consultorio de su doctor(a), en una clnica privada, en un centro de atencin urgente o en una sala de emergencias.  Si tiene Engineering geologist, por favor llame inmediatamente al 911 o vaya a la sala de emergencias.  Nmeros de bper  - Dr. Nehemiah Massed: [phone]  - Dra. Moye: [phone]  - Dra. Nicole Kindred: [phone]  En caso de inclemencias del [place], por favor llame a Johnsie Kindred principal al [phone] para una actualizacin sobre el [place] de cualquier retraso o cierre.  Consejos para la medicacin en dermatologa: Por favor, guarde las cajas en las que vienen los medicamentos de uso tpico para ayudarle a seguir las instrucciones sobre dnde y cmo usarlos. Las farmacias generalmente imprimen las instrucciones del medicamento slo en las cajas y no directamente en los tubos del [place].   Si su medicamento es muy caro, por favor, pngase en contacto con Zigmund Daniel llamando al [phone] y presione la opcin 4 o envenos un mensaje a travs de Pharmacist, community.   No podemos decirle cul ser su copago por los medicamentos por adelantado ya que esto es diferente dependiendo de la cobertura de su seguro. Sin embargo, es posible que podamos encontrar un medicamento sustituto a Electrical engineer un formulario para que el seguro cubra el medicamento que se considera necesario.   Si se requiere una autorizacin previa para que su compaa de seguros [place]  su medicamento, por favor permtanos de 1 a 2 das hbiles para completar este proceso.  Los precios de los medicamentos varan con frecuencia dependiendo del Environmental consultant de dnde se surte la receta y alguna farmacias pueden ofrecer precios ms baratos.  El sitio web www.goodrx.com tiene cupones para medicamentos de Airline pilot. Los precios aqu no tienen en cuenta lo que podra costar con la ayuda del seguro (puede ser ms barato con su seguro), pero el sitio web puede darle el precio si no utiliz Research scientist (physical sciences).  - Puede imprimir el cupn correspondiente y llevarlo con su receta a la farmacia.  - Tambin puede pasar por nuestra oficina durante el horario de atencin regular y Charity fundraiser una tarjeta de cupones de GoodRx.  - Si necesita que su receta se enve electrnicamente a una farmacia diferente, informe a nuestra oficina a travs de MyChart de [place] o por telfono llamando al [phone] y presione la opcin 4.

## 2021-09-14 NOTE — Progress Notes (Signed)
Follow-Up Visit   Subjective  Cameron Foster. is a 81 y.o. male who presents for the following: Actinic Keratosis (Of the face - recheck for new or persistent precancerous lesions). The patient has spots, moles and lesions to be evaluated, some may be new or changing.   The following portions of the chart were reviewed this encounter and updated as appropriate:       Review of Systems:  No other skin or systemic complaints except as noted in HPI or Assessment and Plan.  Objective  Well appearing patient in no apparent distress; mood and affect are within normal limits.  A focused examination was performed including the face. Relevant physical exam findings are noted in the Assessment and Plan.  L lat eyebrow x 1, L forehead x 3, L temple x 2, L preauricular x 2, L malar cheek x 1, R forehead, upper ear helix x 1, lower eye helix x 1, L lower ear helix x 1 (12) Erythematous thin papules/macules with gritty scale.   Cheeks Dilated blood vessels.    Assessment & Plan  AK (actinic keratosis) (12) L lat eyebrow x 1, L forehead x 3, L temple x 2, L preauricular x 2, L malar cheek x 1, R forehead, upper ear helix x 1, lower eye helix x 1, L lower ear helix x 1  Destruction of lesion - L lat eyebrow x 1, L forehead x 3, L temple x 2, L preauricular x 2, L malar cheek x 1, R forehead, upper ear helix x 1, lower eye helix x 1, L lower ear helix x 1  Destruction method: cryotherapy   Informed consent: discussed and consent obtained   Lesion destroyed using liquid nitrogen: Yes   Region frozen until ice ball extended beyond lesion: Yes   Outcome: patient tolerated procedure well with no complications   Post-procedure details: wound care instructions given   Additional details:  Prior to procedure, discussed risks of blister formation, small wound, skin dyspigmentation, or rare scar following cryotherapy. Recommend Vaseline ointment to treated areas while healing.    Telangiectasias Cheeks  Benign-appearing.  Observation.  Call clinic for new or changing lesions.  Recommend daily use of broad spectrum spf 30+ sunscreen to sun-exposed areas.     Actinic Damage - chronic, secondary to cumulative UV radiation exposure/sun exposure over time - diffuse scaly erythematous macules with underlying dyspigmentation - Recommend daily broad spectrum sunscreen SPF 30+ to sun-exposed areas, reapply every 2 hours as needed.  - Recommend staying in the shade or wearing long sleeves, sun glasses (UVA+UVB protection) and wide brim hats (4-inch brim around the entire circumference of the hat). - Call for new or changing lesions.  Sebaceous Hyperplasia - Small yellow papules with a central dell - Benign - Observe  Seborrheic Keratoses - Stuck-on, waxy, tan-brown papules and/or plaques  - Benign-appearing - Discussed benign etiology and prognosis. - Observe - Call for any changes  Xerosis - diffuse xerotic patches - recommend gentle, hydrating skin care - gentle skin care handout given  History of Basal Cell Carcinoma of the Skin - No evidence of recurrence today - Recommend regular full body skin exams - Recommend daily broad spectrum sunscreen SPF 30+ to sun-exposed areas, reapply every 2 hours as needed.  - Call if any new or changing lesions are noted between office visits  Return in about 6 months (around 03/17/2022) for TBSE, AK f/up, h/o BCC/SCC.  Luther Redo, CMA, am acting as scribe for  [name], MD .  Documentation: I have reviewed the above documentation for accuracy and completeness, and I agree with the above.  [name] MD

## 2021-09-15 ENCOUNTER — Telehealth: Payer: Medicare Other | Admitting: Nurse Practitioner

## 2021-09-15 ENCOUNTER — Encounter: Payer: Self-pay | Admitting: Nurse Practitioner

## 2021-09-15 DIAGNOSIS — G8929 Other chronic pain: Secondary | ICD-10-CM | POA: Diagnosis not present

## 2021-09-15 DIAGNOSIS — I129 Hypertensive chronic kidney disease with stage 1 through stage 4 chronic kidney disease, or unspecified chronic kidney disease: Secondary | ICD-10-CM | POA: Diagnosis not present

## 2021-09-15 DIAGNOSIS — I639 Cerebral infarction, unspecified: Secondary | ICD-10-CM

## 2021-09-15 DIAGNOSIS — E785 Hyperlipidemia, unspecified: Secondary | ICD-10-CM | POA: Diagnosis not present

## 2021-09-15 DIAGNOSIS — M169 Osteoarthritis of hip, unspecified: Secondary | ICD-10-CM | POA: Diagnosis not present

## 2021-09-15 DIAGNOSIS — Z87891 Personal history of nicotine dependence: Secondary | ICD-10-CM

## 2021-09-15 DIAGNOSIS — R5381 Other malaise: Secondary | ICD-10-CM | POA: Diagnosis not present

## 2021-09-15 DIAGNOSIS — Z515 Encounter for palliative care: Secondary | ICD-10-CM

## 2021-09-15 DIAGNOSIS — N1831 Chronic kidney disease, stage 3a: Secondary | ICD-10-CM | POA: Diagnosis not present

## 2021-09-15 DIAGNOSIS — F32A Depression, unspecified: Secondary | ICD-10-CM | POA: Diagnosis not present

## 2021-09-15 NOTE — Progress Notes (Signed)
[facility] Consult Note Telephone: [phone]  Fax: [phone]    Date of encounter: 09/15/21 5:39 PM PATIENT NAME: Cameron Foster. [address] [place] [address]   [phone] (home)  DOB: 07-25-1940 MRN: [id number] PRIMARY CARE PROVIDER:    [name], MD,  [address] [place] [address] [phone]  RESPONSIBLE PARTY:    Contact Information     Name Relation Home Work Mobile   [place] Spouse [phone]  [phone]   [place],Lisa Daughter [phone]  [phone]     Due to the COVID-19 crisis, this visit was done via telemedicine from my office and it was initiated and consent by this patient and or family.   I connected with  [name] OR PROXY on 08/10/21 by a video enabled telemedicine application and verified that I am speaking with the correct person using two identifiers.   I discussed the limitations of evaluation and management by telemedicine. The patient expressed understanding and agreed to proceed. Palliative Care was asked to follow this patient by consultation request of  Bacigalupo, Dionne Bucy, MD to address advance care planning and complex medical decision making. This is a follow up visit.                                  ASSESSMENT AND PLAN / RECOMMENDATIONS:  Symptom Management/Plan: 1. Advance Care Planning;  Wishes are to treat what is treatable, living will in vynca    2. Debility secondary to late onset CVA; discussed at length about mobility, ADL's, planning for next phase of his life which we talked about his discussion with PC SW. ,    3. Chronic pain due to OA hip with pending sgy for hip with orthopedic which is scheduled for next month. We talked about OTC Arnicare topical can try. We talked about therapy following sgy. Mr. Doro endorses he has f/u orthopedic appointments.    4. Palliative care encounter; Palliative care  encounter; Palliative medicine team will continue to support patient, patient's family, and medical team. Visit consisted of counseling and education dealing with the complex and emotionally intense issues of symptom management and palliative care in the setting of serious and potentially life-threatening illness   Follow up Palliative Care Visit: Palliative care will continue to follow for complex medical decision making, advance care planning, and clarification of goals. Return 8 weeks or prn.   I spent 32 minutes providing this consultation. More than 50% of the time in this consultation was spent in counseling and care coordination.   PPS: 60%   Chief Complaint: Follow up palliative consult for complex medical decision making   HISTORY OF PRESENT ILLNESS:  Devan Danzer. is a 81 y.o. year old male  with multiple medial problems including SAH (2020), CVA (lucunar infarct), atrial fib, HTN, chronic pansinusitis, h/o gastric ulcer with hemorrhage, IBS, h/o h.pylori, IBS, GERD, foraminal stenosis of lumbar region, cervical region, chronic low back pain, HLD, OA left hip. Mr. Cadieux and I connected for telemedicine visit. Mr. Eastridge. We reviewed no new changes, having upcoming hip sgy with has been scheduled for next month. We talked about ros including pain. Mr Porzio endorses his hip pain worsens with ambulation. We talked about pain management, recommended OTC Arnicare, discussed risks and benefits. We talked about ros,symptoms, sleeping well. No other s/s. We talked about Mr. Lapinsky plan  for therapy following his sgy. We talked about appetite which has been good, nutrition. We talked about medical goals. We talked about briefly about discussion with [employer] SW. We talked about f/u pc visit, Mr Papillion endorses at this time he feels like he is doing well, requested a business care should things change. Contact information provided. Mr. Macera requested d/c from [facility], notified and d/c. Therapeutic  listening, emotional support provided. Questions answered.    History obtained from review of EMR, discussion with Mr. and Mrs Castilleja.  I reviewed available labs, medications, imaging, studies and related documents from the EMR.  Records reviewed and summarized above.    ROS 10 point system reviewed negative except HPI Physical Exam: deferred  Thank you for the opportunity to participate in the care of Mr. Agner.  The palliative care team will continue to follow. Please call our office at [phone] if we can be of additional assistance.   Romney Compean Ihor Gully, NP

## 2021-09-21 ENCOUNTER — Encounter
Admission: RE | Admit: 2021-09-21 | Discharge: 2021-09-21 | Disposition: A | Discharge status: Home / Self Care | Payer: Medicare Other | Source: Ambulatory Visit | Attending: Orthopedic Surgery | Admitting: Orthopedic Surgery

## 2021-09-21 ENCOUNTER — Other Ambulatory Visit: Payer: Self-pay | Admitting: Orthopedic Surgery

## 2021-09-21 ENCOUNTER — Encounter: Payer: Self-pay | Admitting: Orthopedic Surgery

## 2021-09-21 ENCOUNTER — Other Ambulatory Visit: Payer: Medicare Other

## 2021-09-21 VITALS — BP 141/96 | HR 63 | Temp 98.4°F | Resp 18 | Ht 68.0 in | Wt 203.0 lb

## 2021-09-21 DIAGNOSIS — Z01812 Encounter for preprocedural laboratory examination: Secondary | ICD-10-CM | POA: Insufficient documentation

## 2021-09-21 DIAGNOSIS — Z01818 Encounter for other preprocedural examination: Secondary | ICD-10-CM

## 2021-09-21 DIAGNOSIS — Z01810 Encounter for preprocedural cardiovascular examination: Secondary | ICD-10-CM

## 2021-09-21 LAB — URINALYSIS, ROUTINE W REFLEX MICROSCOPIC
Bacteria, UA: NONE SEEN
Bilirubin Urine: NEGATIVE
Glucose, UA: NEGATIVE mg/dL
Hgb urine dipstick: NEGATIVE
Ketones, ur: NEGATIVE mg/dL
Leukocytes,Ua: NEGATIVE
Nitrite: NEGATIVE
Protein, ur: 30 mg/dL — AB
Specific Gravity, Urine: 1.024 (ref 1.005–1.030)
pH: 5 (ref 5.0–8.0)

## 2021-09-21 LAB — TYPE AND SCREEN
ABO/RH(D): O POS
Antibody Screen: NEGATIVE

## 2021-09-21 LAB — SURGICAL PCR SCREEN
MRSA, PCR: NEGATIVE
Staphylococcus aureus: NEGATIVE

## 2021-09-21 NOTE — H&P (Signed)
NAME: Cameron Foster. MRN:   [id number] DOB:   06/02/1940     HISTORY AND PHYSICAL  CHIEF COMPLAINT:  right hip pain  HISTORY:   Cameron Fosteris a 81 y.o. male  with right  Hip Pain Patient complains of right hip pain. Onset of the symptoms was several years ago. Inciting event: known DJD. The patient reports the hip pain is worse with weight bearing. Associated symptoms: none. Aggravating symptoms include: any weight bearing. Patient has had prior hip problems. Previous visits for this problem: multiple, this is a longstanding diagnosis. Last seen several weeks ago by me. Evaluation to date: plain films, which were abnormal  osteoarthritis . Treatment to date: OTC analgesics, which have been somewhat effective, prescription analgesics, which have been somewhat effective, and physical therapy, which has been somewhat effective.    Plan for right total hip replacement  PAST MEDICAL HISTORY:   Past Medical History:  Diagnosis Date   Alcoholism ([place])    Arthritis    Atrial fibrillation ([place]) 1995   one episode   Basal cell carcinoma 04/26/2017   Right medial cheek. Superficial and nodular   Basal cell carcinoma 06/11/2019   Left anterior shoulder. Nodular pattern   Basal cell carcinoma 09/09/2019   Right nasal ala, [facility]   Basal cell carcinoma 02/05/2020   L upper eyebrow, [facility]    Carotid arterial disease ([place])    a. June 17, 2019 Carotid U/S: <50% bilat ICA stenoses.   Chicken pox    Colon cancer ([facility])    Colon polyp    Depression    Son died 2014/06/16   Diverticulitis    Diverticulosis 30 years   Diverticulosis    Dysrhythmia    Gastritis    GERD (gastroesophageal reflux disease)    H. pylori infection    History of cerebral hemorrhage    History of echocardiogram    a. 06-17-2019 Echo: EF 55-60%, no rwma, triv MR/AI.   History of stress test    a. 09/2015 MV: No ischemia/infarct. EF 45-54% (nl by echo).   Hyperlipidemia    Hypertension    Irritable bowel syndrome    Prostate cancer  ([place]) June 16, 2010   treated with radiation therapy. Prostate   PSVT (paroxysmal supraventricular tachycardia) ([place])    a. 06/2019 Zio: Avg rate 74 (54-120), occas PACs, rare PVCs, 125 episodes of PVCs (longest 17.5 secs; max rate 187). No afib.   Squamous cell carcinoma of skin 07/18/2017   Left medial calf. KA type   Squamous cell carcinoma of skin 04/24/2018   Right above med. brow   Squamous cell carcinoma of skin 06/11/2019   Right posterior shoulder. SCCis, hypertrophic   Squamous cell carcinoma of skin 01/09/2020   Mid nasal dorsum, MOHS, Efudex x 4wks   Stroke ([place]) 2020   Vasovagal syncope     PAST SURGICAL HISTORY:   Past Surgical History:  Procedure Laterality Date   [place] no stents   CATARACT EXTRACTION, BILATERAL     COLON RESECTION SIGMOID N/A 12/07/2016   Procedure: COLON RESECTION SIGMOID;  Surgeon: Clayburn Pert, MD;  Location: [facility] ORS;  Service: General;  Laterality: N/A;   COLON SURGERY  11/2016   Colostomy   COLONOSCOPY  06-16-14   COLONOSCOPY WITH PROPOFOL N/A 05/30/2016   Procedure: COLONOSCOPY WITH PROPOFOL;  Surgeon: Lollie Sails, MD;  Location: [facility] ENDOSCOPY;  Service: Endoscopy;  Laterality: N/A;   COLOSTOMY Left 12/07/2016   Procedure: COLOSTOMY;  Surgeon: Clayburn Pert, MD;  Location: [facility] ORS;  Service: General;  Laterality: Left;   COLOSTOMY REVERSAL N/A 03/21/2017   Procedure: COLOSTOMY REVERSAL;  Surgeon: Clayburn Pert, MD;  Location: [facility] ORS;  Service: General;  Laterality: N/A;   COLOSTOMY TAKEDOWN N/A 03/21/2017   Procedure: LAPAROSCOPIC COLOSTOMY TAKEDOWN;  Surgeon: Clayburn Pert, MD;  Location: [facility] ORS;  Service: General;  Laterality: N/A;   CYSTOSCOPY WITH STENT PLACEMENT Bilateral 03/21/2017   Procedure: CYSTOSCOPY WITH LIGHTED STENT PLACEMENT;  Surgeon: Abbie Sons, MD;  Location: [facility]S;  Service: Urology;  Laterality: Bilateral;   ESOPHAGOGASTRODUODENOSCOPY      ESOPHAGOGASTRODUODENOSCOPY N/A 05/30/2016   Procedure: ESOPHAGOGASTRODUODENOSCOPY (EGD);  Surgeon: Lollie Sails, MD;  Location[facility] END[facility]COPY;  Service: Endoscopy;  Laterality: N/A;   ESOPHAGOGASTRODUODENOSCOPY N/A 01/14/2021   Procedure: ESOPHAGOGASTRODUODENOSCOPY (EGD);  Surgeon: Toledo, Benay Pike, MD;  Location: [facility] ENDOSCOPY;  Service: Gastroenterology;  Laterality: N/A;   ESOPHAGOGASTRODUODENOSCOPY (EGD) WITH PROPOFOL N/A 04/28/2021   Procedure: ESOPHAGOGASTRODUODENOSCOPY (EGD) WITH PROPOFOL;  Surgeon: Toledo, Benay Pike, MD;  Location: [facility] ENDOSCOPY;  Service: Gastroenterology;  Laterality: N/A;   EYE SURGERY     cataracts   FLEXIBLE SIGMOIDOSCOPY N/A 03/21/2017   Procedure: FLEXIBLE SIGMOIDOSCOPY;  Surgeon: Clayburn Pert, MD;  Location: [facility] ORS;  Service: General;  Laterality: N/A;   FLEXIBLE SIGMOIDOSCOPY N/A 06/24/2021   Procedure: Beryle Quant;  Surgeon: Jonathon Bellows, MD;  Location: [facility] ENDOSCOPY;  Service: Gastroenterology;  Laterality: N/A;   FRACTURE SURGERY Bilateral    right arm and left wrist   INCISION AND DRAINAGE ABSCESS N/A 12/07/2016   Procedure: DRAINAGE  OF INTRA ABDOMINAL ABSCESS;  Surgeon: Clayburn Pert, MD;  Location: [facility] OR[facility];  Service: General;  Laterality: N/A;   LAPAROTOMY N/A 12/07/2016   Procedure: EXPLORATORY LAPAROTOMY;  Surgeon: Clayburn Pert, MD;  Location: [facility] ORS;  Service: General;  Laterality: N/A;   PROSTATE SURGERY     Microwave therapy   TONSILLECTOMY     TOTAL KNEE ARTHROPLASTY Right 07/27/2016   Procedure: TOTAL KNEE ARTHROPLASTY;  Surgeon: [name], MD;  Location: [facility] ORS;  Service: Orthopedics;  Laterality: Right;  Dr. Erlene Quan had to place Urinary catheter due to prostate cancer history.  Using flexible scope.    MEDICATIONS:  (Not in a hospital admission)   ALLERGIES:   Allergies  Allergen Reactions   Formaldehyde Rash    From shoes made with this material    REVIEW OF SYSTEMS:   Negative except  HPI  FAMILY HISTORY:   Family History  Problem Relation Age of Onset   Lung cancer Father        smoker   Other Mother    Sudden death Son        due to "Blood clots"   Bipolar disorder Son    Heart disease Son    Kidney disease Daughter        congenital one small kidney   Prostate cancer Neg Hx    Bladder Cancer Neg Hx     SOCIAL HISTORY:   reports that he quit smoking about 33 years ago. His smoking use included cigarettes. He has a 27.00 pack-year smoking history. He has quit using smokeless tobacco.  His smokeless tobacco use included chew. He reports that he does not currently use alcohol after a past usage of about 7.0 standard drinks per week. He reports that he does not use drugs.  PHYSICAL EXAM:  General appearance: alert, cooperative, and no distress Neck: no JVD and supple, symmetrical, trachea midline  Resp: clear to auscultation bilaterally Cardio: regular rate and rhythm, S1, S2 normal, no murmur, click, rub or gallop GI: soft, non-tender; bowel sounds normal; no masses,  no organomegaly Extremities: extremities normal, atraumatic, no cyanosis or edema and Homans sign is negative, no sign of DVT Pulses: 2+ and symmetric Skin: Skin color, texture, turgor normal. No rashes or lesions Neurologic: Alert and oriented X 3, normal strength and tone. Normal symmetric reflexes. Normal coordination and gait    LABORATORY STUDIES: No results for input(s): WBC, HGB, HCT, PLT in the last 72 hours.  No results for input(s): NA, K, CL, CO2, GLUCOSE, BUN, CREATININE, CALCIUM in the last 72 hours.  STUDIES/RESULTS:  No results found.  ASSESSMENT:  End stage osteoarthritis right hip        Active Problems:   * No active hospital problems. *    PLAN:  Right Primary Total Hip   Carlynn Spry 09/21/2021. 2:40 PM

## 2021-09-21 NOTE — Patient Instructions (Addendum)
Your procedure is scheduled on: Monday September 27, 2021. Report to Day Surgery inside [facility], stop by admissions desk before getting on elevator. To find out your arrival time please call [phone] between 1PM - 3PM on Friday September 24, 2021.  Remember: Instructions that are not followed completely may result in serious medical risk,  up to and including death, or upon the discretion of your surgeon and anesthesiologist your  surgery may need to be rescheduled.     _X__ 1. Do not eat food or drink fluids after midnight the night before your procedure.                 No chewing gum or hard candies.   __X__2.  On the morning of surgery brush your teeth with toothpaste and water, you                may rinse your mouth with mouthwash if you wish.  Do not swallow any toothpaste or mouthwash.     _X__ 3.  No Alcohol for 24 hours before or after surgery.   _X__ 4.  Do Not Smoke or use e-cigarettes For 24 Hours Prior to Your Surgery.                 Do not use any chewable tobacco products for at least 6 hours prior to                 Surgery.  _X__  5.  Do not use any recreational drugs (marijuana, cocaine, heroin, ecstasy, MDMA or other)                For at least one week prior to your surgery.  Combination of these drugs with anesthesia                May have life threatening results.  ____ 6.  Bring all medications with you on the day of surgery if instructed.   __X_ 7.  Notify your doctor if there is any change in your medical condition      (cold, fever, infections).     Do not wear jewelry, make-up, hairpins, clips or nail polish. Do not wear lotions, powders, or perfumes. You may wear deodorant. Do not shave 48 hours prior to surgery. Men may shave face and neck. Do not bring valuables to the hospital.    [facility] is not responsible for any belongings or valuables.  Contacts, dentures or bridgework may not be worn into surgery. Leave your  suitcase in the car. After surgery it may be brought to your room. For patients admitted to the hospital, discharge time is determined by your treatment team.   Patients discharged the day of surgery will not be allowed to drive home.   Make arrangements for someone to be with you for the first 24 hours of your Same Day Discharge.    __X__ Take these medicines the morning of surgery with A SIP OF WATER:    1. sertraline (ZOLOFT) 50 MG  2. metoprolol tartrate (LOPRESSOR) 100 MG  3.   4.  5.  6.  ____ Fleet Enema (as directed)   __X__ Use CHG Soap (or wipes) as directed  ____ Use Benzoyl Peroxide Gel as instructed  __X__ Use inhalers on the day of surgery  fluticasone (FLONASE) 50 MCG/ACT nasal spray  ____ Stop metformin 2 days prior to surgery    ____ Take 1/2 of usual insulin dose the night before surgery.  No insulin the morning          of surgery.   __X_ Stop aspirin 81 mg as instructed by your doctor.    __X__ One Week prior to surgery- Stop Anti-inflammatories such as Ibuprofen, Aleve, Advil, Motrin, meloxicam (MOBIC), diclofenac, etodolac, ketorolac, Toradol, Daypro, piroxicam, Goody's or BC powders. OK TO USE TYLENOL IF NEEDED   __X__ Stop ALL supplements until after surgery.    ____ Bring C-Pap to the hospital.    If you have any questions regarding your pre-procedure instructions,  Please call Pre-admit Testing at [phone]

## 2021-09-23 ENCOUNTER — Encounter: Payer: Self-pay | Admitting: Orthopedic Surgery

## 2021-09-24 ENCOUNTER — Encounter: Payer: Self-pay | Admitting: Orthopedic Surgery

## 2021-09-24 NOTE — Progress Notes (Signed)
Perioperative Services  Pre-Admission/Anesthesia Testing Clinical Review  Date: 09/24/21  Patient Demographics:  Name: Cameron Foster. DOB:   1941-02-14 MRN:   [id number]  Planned Surgical Procedure(s):    Case: [id number] Date/Time: 09/27/21 1217   Procedure: TOTAL HIP ARTHROPLASTY ANTERIOR APPROACH (Right: Hip)   Anesthesia type: Spinal   Pre-op diagnosis: M16.11 unilateral primary osteoarthritis right hip   Location: [facility]   Surgeons: Lovell Sheehan, MD   NOTE: Available PAT nursing documentation and vital signs have been reviewed. Clinical nursing staff has updated patient's PMH/PSHx, current medication list, and drug allergies/intolerances to ensure comprehensive history available to assist in medical decision making as it pertains to the aforementioned surgical procedure and anticipated anesthetic course. Extensive review of available clinical information performed. [facility] and PSHx updated with any diagnoses/procedures that  may have been inadvertently omitted during his intake with the pre-admission testing department's nursing staff.  Clinical Discussion:  Cameron Foster. is a 81 y.o. male who is submitted for pre-surgical anesthesia review and clearance prior to him undergoing the above procedure. Patient is a Former Smoker (27 pack years; quit 04/1988). Pertinent PMH includes: atrial fibrillation (single episode), PSVT, NSVT, RBBB, systolic dysfunction, CVA, SAH, BILATERAL carotid artery stenosis, ascending aorta dilation, HTN, HLD, GERD (on daily PPI), OA.  Patient is followed by cardiology (End, MD). He was last seen in the cardiology clinic on 08/23/2021; notes reviewed.  At the time of his clinic visit, patient doing well overall from a cardiovascular perspective.  He denied any episodes of chest pain, shortness breath, PND, orthopnea, palpitations, significant peripheral edema, vertiginous symptoms, or presyncope/syncope.   Patient with past medical history significant for cardiovascular diagnoses. Of note, records regarding full cardiovascular history not available for review at time of consult, as patient has received care in [place].  Patient has experienced a single episode of atrial fibrillation back in 1995.  This was in the setting of electrolyte abnormalities.  Patient reports that he had a cardiac catheterization performed at that time revealing no significant CAD.   Myocardial perfusion imaging study performed on 10/02/2015 revealed a mildly decreased left ventricular systolic function with an EF of 45-54%.  There was no evidence of stress-induced myocardial ischemia or arrhythmia; no scintigraphic evidence of scar.  Study determined to be normal and low risk.  Patient suffered a spontaneous subarachnoid hemorrhage on 10/10/2018.  Patient was [facility] to [facility] for ongoing care and management. Cerebral hemorrhage resolved without need for surgical intervention.  Patient suffered an acute lacunar infarct on 06/08/2019.  Imaging revealed small infarction located at the RIGHT motor strip.  Patient has no residual deficits from this neurological event.  Patient with a recurrent episode of atrial fibrillation in the setting of acute GI bleeding related to colitis in 06/2021.  He was treated with IV amiodarone, which prompted pharmacological cardioversion.  Patient went on apixaban short-term.  Long-term cardiac event monitor study was performed revealing a predominant underlying sinus rhythm with an average heart rate of 74 bpm (range 54-120 bpm).  There were occasional PACs (about 4% burden) and rare PVCs noted.  There were 125 episodes of PSVT noted with the longest lasting 17.5 seconds at a maximum heart rate of 187 bpm.  There were no sustained arrhythmias or prolonged pauses noted.  Given the absence of atrial fibrillation on the event monitor study, and in the setting of GI bleed, patient was  transitioned off of  oral anticoagulation and placed on daily low-dose ASA.  Last TTE was performed on 06/26/2021 revealing a mildly reduced left ventricular systolic function with moderate concentric LVH; LVEF was 45-50%.  Left atrium was mildly dilated.  There was mild mitral valve regurgitation.  Aortic dilatation noted; 41 mm root and 39 mm ascending aorta.  There was no evidence of a significant transvalvular gradient to suggest stenosis.  Repeat long-term cardiac event monitor study was performed on 08/13/2021 revealing a predominant underlying sinus rhythm at a heart rate of 63 bpm; range 50-102 bpm).  There was rare atrial and ventricular ectopy, in addition to 1 episode of NSVT lasting 5 beats at a maximum rate of 160 bpm.  There were 78 atrial runs lasting up to 15 beats at a maximum rate of 171 bpm.  No other sustained arrhythmias, including atrial fibrillation/flutter) or prolonged pauses were observed.  Blood pressure noted to be reasonably controlled at 130/90 on currently prescribed ACEi and beta-blocker therapies.  Patient is on a statin for his HLD and further ASCVD prevention.  He is not diabetic.  Functional capacity limited by arthritides, which mainly includes significant pain in his RIGHT hip.  With that being said, given patient's activity limitations, DASI defined functional capacity difficult to assess per cardiology. Patient is being jointly managed by palliative care for debility and chronic pain. No changes were made to his medication regimen.  Patient to follow-up with outpatient cardiology and 6 months or sooner if needed.  Cameron Foster. is scheduled for an elective RIGHT TOTAL HIP ARTHROPLASTY ANTERIOR APPROACH on 09/27/2021 with Dr. Kurtis Bushman, MD. Given patient's past medical history significant for cardiovascular diagnoses, presurgical cardiac clearance was sought by the performing surgeon's office and PAT team. Per cardiology, "evaluated by his primary cardiologist  who felt further cardiac testing would not further mitigate his perioperative cardiovascular risk.  Per RCRI, he is low risk for noncardiac surgery.  Difficult to assess functional status secondary to right hip pain, however he is without symptoms concerning for angina or decompensation.  He may proceed with noncardiac surgery at an overall LOW cardiac risk without further cardiac testing". In review of his medication reconciliation, it is noted the patient is on daily antiplatelet therapy. He has been instructed on recommendations from his cardiologist for continuing his daily low dose ASA throughout the perioperative course.    Patient denies previous perioperative complications with anesthesia in the past. In review of the available records, it is noted that patient underwent a general anesthetic course here at [facility] (ASA [facility]) in 04/2021 without documented complications.      09/21/2021    2:30 PM 08/23/2021    2:28 PM 07/22/2021    3:27 PM  Vitals with BMI  Height 5' 8"  5' 8"  5' 8.5"  Weight 203 lbs 206 lbs 204 lbs 6 oz  BMI 30.87 74.14 23.95  Systolic 320 233 435  Diastolic 96 90 80  Pulse 63 60 58    Providers/Specialists:   NOTE: Primary physician provider listed below. Patient may have been seen by APP or partner within same practice.   PROVIDER ROLE / SPECIALTY LAST OV  Lovell Sheehan, MD Orthopedics (Surgeon)  09/21/2021  [name], MD Primary Care Provider  07/16/2021  [name], MD Cardiology  08/23/2021  Natalia Leatherwood, NP-C Palliative Care 09/15/2021   Allergies:  Formaldehyde  Current Home Medications:   No current facility-administered medications for this encounter.    aspirin  81 MG EC tablet   atorvastatin (LIPITOR) 40 MG tablet   docusate sodium (COLACE) 100 MG capsule   fluticasone (FLONASE) 50 MCG/ACT nasal spray   folic acid (FOLVITE) 1 MG tablet   Guaifenesin (MUCINEX MAXIMUM STRENGTH) 1200 MG TB12    hydrocortisone 2.5 % cream   lisinopril (ZESTRIL) 10 MG tablet   meclizine (ANTIVERT) 25 MG tablet   metoprolol tartrate (LOPRESSOR) 100 MG tablet   Multiple Vitamin (MULTIVITAMIN WITH MINERALS) TABS tablet   pantoprazole (PROTONIX) 40 MG tablet   sertraline (ZOLOFT) 50 MG tablet   vitamin B-12 (CYANOCOBALAMIN) 1000 MCG tablet   History:   Past Medical History:  Diagnosis Date   Alcoholism ([place])    Arthritis    Ascending aorta dilation ([place]) 03/28/2018   a.) Vascular US: prox asc Ao measured 29 mm. b.) TTE 06/26/2021: Ao root 41 mm; asc Ao 39 mm   Atrial fibrillation (HCC) 1995   a.) single episode in the setting of GI bleeding related to colitis.   Basal cell carcinoma 04/26/2017   Right medial cheek. Superficial and nodular   Basal cell carcinoma 06/11/2019   Left anterior shoulder. Nodular pattern   Basal cell carcinoma 09/09/2019   Right nasal ala, [facility]   Basal cell carcinoma 02/05/2020   L upper eyebrow, [facility]    Bilateral carotid artery stenosis 06/09/2019   a.) Carotid doppler: mod; <50% BILATERAL ICAs.   Chicken pox    Colon cancer (HCC)    Colon polyp    Depression    Son died Jul 09, 2014   Diverticulitis    Diverticulosis 30 years   Gastritis    GERD (gastroesophageal reflux disease)    H. pylori infection    History of echocardiogram    a.) TTE 10/05/2015: EF 50-55%; mild LVH; LAE; triv AR, mild MR. b.) TTE 03/29/2018: EF 55-60%; LAE, mild AR; ? small PFO. c.) TTE 06/09/2019: EF 55-60%, no rwma, triv MR/AI. d.) TTE 06/26/2021: EF 45-50%; glob HK; LAE; mild MR; Ao root 41 mm; asc Ao 39 mm.   History of stress test    a. 09/2015 MV: No ischemia/infarct. EF 45-54% (nl by echo).   Hyperlipidemia    Hypertension    Irritable bowel syndrome    Lacunar infarction ([place]) 06/08/2019   a.) small; RIGHT motor strip   Prostate cancer ([place]) 09-Jul-2010   a.) s/p XRT   PSVT (paroxysmal supraventricular tachycardia) ([place])    a. 06/2019 Zio: Avg rate 74 (54-120), occas PACs, rare PVCs, 125  episodes of PVCs (longest 17.5 secs; max rate 187). No afib.   SAH (subarachnoid hemorrhage) ([place]) 10/10/2018   Squamous cell carcinoma of skin 07/18/2017   Left medial calf. KA type   Squamous cell carcinoma of skin 04/24/2018   Right above med. brow   Squamous cell carcinoma of skin 06/11/2019   Right posterior shoulder. SCCis, hypertrophic   Squamous cell carcinoma of skin 01/09/2020   Mid nasal dorsum, MOHS, Efudex x 4wks   Vasovagal syncope    Past Surgical History:  Procedure Laterality Date   [place] no stents   CATARACT EXTRACTION, BILATERAL     COLON RESECTION SIGMOID N/A 12/07/2016   Procedure: COLON RESECTION SIGMOID;  Surgeon: Clayburn Pert, MD;  Location: [facility];  Service: General;  Laterality: N/A;   COLON SURGERY  11/2016   Colostomy   COLONOSCOPY  07/09/2014   COLONOSCOPY WITH PROPOFOL N/A 05/30/2016   Procedure: COLONOSCOPY WITH PROPOFOL;  Surgeon: [name]  Gustavo Lah, MD;  Location: [facility]COPY;  Service: Endoscopy;  Laterality: N/A;   COLOSTOMY Left 12/07/2016   Procedure: COLOSTOMY;  Surgeon: Clayburn Pert, MD;  Location: [facility];  Service: General;  Laterality: Left;   COLOSTOMY REVERSAL N/A 03/21/2017   Procedure: COLOSTOMY REVERSAL;  Surgeon: Clayburn Pert, MD;  Location: [facility];  Service: General;  Laterality: N/A;   COLOSTOMY TAKEDOWN N/A 03/21/2017   Procedure: LAPAROSCOPIC COLOSTOMY TAKEDOWN;  Surgeon: Clayburn Pert, MD;  Location: [facility];  Service: General;  Laterality: N/A;   CYSTOSCOPY WITH STENT PLACEMENT Bilateral 03/21/2017   Procedure: CYSTOSCOPY WITH LIGHTED STENT PLACEMENT;  Surgeon: Abbie Sons, MD;  Location: [facility];  Service: Urology;  Laterality: Bilateral;   ESOPHAGOGASTRODUODENOSCOPY     ESOPHAGOGASTRODUODENOSCOPY N/A 05/30/2016   Procedure: ESOPHAGOGASTRODUODENOSCOPY (EGD);  Surgeon: Lollie Sails, MD;  Location: [facility] ENDOSCOPY;  Service: Endoscopy;  Laterality: N/A;    ESOPHAGOGASTRODUODENOSCOPY N/A 01/14/2021   Procedure: ESOPHAGOGASTRODUODENOSCOPY (EGD);  Surgeon: Tol[place], Benay Pike, MD;  Location: [facility]COPY;  Service: Gastroenterology;  Laterality: N/A;   ESOPHAGOGASTRODUODENOSCOPY (EGD) WITH PROPOFOL N/A 04/28/2021   Procedure: ESOPHAGOGASTRODUODENOSCOPY (EGD) WITH PROPOFOL;  Surgeon: Tol[place], Benay Pike, MD;  Location: [facility];  Service: Gastroenterology;  Laterality: N/A;   EYE SURGERY     cataracts   FLEXIBLE SIGMOIDOSCOPY N/A 03/21/2017   Procedure: FLEXIBLE SIGMOIDOSCOPY;  Surgeon: Clayburn Pert, MD;  Location: [facility];  Service: General;  Laterality: N/A;   FLEXIBLE SIGMOIDOSCOPY N/A 06/24/2021   Procedure: Beryle Quant;  Surgeon: Jonathon Bellows, MD;  Location: [facility] ENDOSCOPY;  Service: Gastroenterology;  Laterality: N/A;   FRACTURE SURGERY Bilateral    right arm and left wrist   INCISION AND DRAINAGE ABSCESS N/A 12/07/2016   Procedure: DRAINAGE  OF INTRA ABDOMINAL ABSCESS;  Surgeon: Clayburn Pert, MD;  Location: [facility];  Service: General;  Laterality: N/A;   LAPAROTOMY N/A 12/07/2016   Procedure: EXPLORATORY LAPAROTOMY;  Surgeon: Clayburn Pert, MD;  Location: [facility];  Service: General;  Laterality: N/A;   PROSTATE SURGERY     Microwave therapy   TONSILLECTOMY     TOTAL KNEE ARTHROPLASTY Right 07/27/2016   Procedure: TOTAL KNEE ARTHROPLASTY;  Surgeon: [name], MD;  Location: [facility];  Service: Orthopedics;  Laterality: Right;  Dr. Erlene Quan had to place Urinary catheter due to prostate cancer history.  Using flexible scope.   Family History  Problem Relation Age of Onset   Lung cancer Father        smoker   Other Mother    Sudden death Son        due to "Blood clots"   Bipolar disorder Son    Heart disease Son    Kidney disease Daughter        congenital one small kidney   Prostate cancer Neg Hx    Bladder Cancer Neg Hx    Social History   Tobacco Use   Smoking status: Former    Packs/day: 1.00     Years: 27.00    Total pack years: 27.00    Types: Cigarettes    Quit date: 04/18/1988    Years since quitting: 33.4   Smokeless tobacco: Former    Types: Nurse, children's Use: Never used  Substance Use Topics   Alcohol use: Not Currently    Alcohol/week: 7.0 standard drinks of alcohol    Types: 7 Glasses of wine per week    Comment: weekly-Former heavy use ETOH   Drug use: No  Pertinent Clinical Results:  LABS: Labs reviewed: Acceptable for surgery.  Lab Results  Component Value Date   WBC 7.5 08/24/2021   HGB 11.0 (L) 08/24/2021   HCT 34.1 (L) 08/24/2021   MCV 90 08/24/2021   PLT 250 08/24/2021   Lab Results  Component Value Date   NA 140 08/24/2021   K 4.6 08/24/2021   CO2 27 08/24/2021   GLUCOSE 103 (H) 08/24/2021   BUN 14 08/24/2021   CREATININE 1.37 (H) 08/24/2021   CALCIUM 9.4 08/24/2021   EGFR 52 (L) 08/24/2021   GFRNONAA 45 (L) 07/03/2021   Component Date Value Ref Range Status   ABO/RH(D) 09/21/2021 O POS   Final   Antibody Screen 09/21/2021 NEG   Final   Sample Expiration 09/21/2021 10/05/2021, 2359   Final   Extend sample reason 09/21/2021    Final                   Value:NO TRANSFUSIONS OR PREGNANCY IN THE PAST 3 MONTHS Performed at [facility], [address] [place], [place] [address]    MRSA, PCR 09/21/2021 NEGATIVE  NEGATIVE Final   Staphylococcus aureus 09/21/2021 NEGATIVE  NEGATIVE Final   Comment: (NOTE) The Xpert SA Assay (FDA approved for NASAL specimens in patients 81 years of age and older), is one component of a comprehensive surveillance program. It is not intended to diagnose infection nor to guide or monitor treatment. Performed at [facility], [place], [place] [address]    Color, Urine 09/21/2021 YELLOW (A)  YELLOW Final   APPearance 09/21/2021 HAZY (A)  CLEAR Final   Specific Gravity, Urine 09/21/2021 1.024  1.005 - 1.030 Final   pH 09/21/2021 5.0  5.0 - 8.0 Final   Glucose, UA  09/21/2021 NEGATIVE  NEGATIVE mg/dL Final   Hgb urine dipstick 09/21/2021 NEGATIVE  NEGATIVE Final   Bilirubin Urine 09/21/2021 NEGATIVE  NEGATIVE Final   Ketones, ur 09/21/2021 NEGATIVE  NEGATIVE mg/dL Final   Protein, ur 09/21/2021 30 (A)  NEGATIVE mg/dL Final   Nitrite 09/21/2021 NEGATIVE  NEGATIVE Final   Leukocytes,Ua 09/21/2021 NEGATIVE  NEGATIVE Final   RBC / HPF 09/21/2021 0-5  0 - 5 RBC/hpf Final   WBC, UA 09/21/2021 0-5  0 - 5 WBC/hpf Final   Bacteria, UA 09/21/2021 NONE SEEN  NONE SEEN Final   Squamous Epithelial / LPF 09/21/2021 0-5  0 - 5 Final   Mucus 09/21/2021 PRESENT   Final   Hyaline Casts, UA 09/21/2021 PRESENT   Final   Performed at [facility], [address] [place], [place] [address]    ECG: Date: 08/23/2021 Time ECG obtained: 1435 PM Rate: 60 bpm Rhythm: normal sinus; RBBB Axis (leads I and aVF): Left axis deviation Intervals: PR 200 ms. QRS 120 ms. QTc 476 ms. ST segment and T wave changes: Inferolateral T wave abnormality Comparison: Previous tracing performed on 07/22/2021 revealed sinus bradycardia with a first-degree AV block at a rate of 58 bpm   IMAGING / PROCEDURES: LONG TERM CARDIAC EVENT MONITOR STUDY performed on  08/13/2021 Predominant underlying sinus rhythm at an average heart rate of 63 bpm; range 50-102 bpm. Rare PACs and PVCs Single episode of nonsustained ventricular tachycardia lasting 5 beats at a maximum rate of 160 bpm 78 atrial runs lasting up to 15 beats with a maximum rate of 171 bpm No sustained arrhythmia, including atrial fibrillation/flutter, or prolonged pauses noted  DIAGNOSTIC RADIOGRAPHS OF CHEST PORTABLE 1 VIEW performed on  06/27/2021 Low lung volumes with diffuse peribronchial cuffing, which appears to be chronic compared to the prior study, favored to reflect a chronic bronchitis. No definite acute findings are noted. Aortic atherosclerosis.  TRANSTHORACIC ECHOCARDIOGRAM performed on 06/26/2021 Left  ventricular ejection fraction, by estimation, is 45 to 50%. The left ventricle has mildly decreased function. The left ventricle demonstrates global hypokinesis. There is moderate concentric left ventricular hypertrophy. Left ventricular diastolic parameters are indeterminate. Elevated left ventricular end-diastolic pressure. Right ventricular systolic function is normal. The right ventricular size is normal. There is normal pulmonary artery systolic pressure.  Left atrial size was mildly dilated.  The mitral valve is normal in structure. Mild mitral valve regurgitation. No evidence of mitral stenosis.  The aortic valve is tricuspid. There is mild calcification of the aortic valve. Aortic valve regurgitation is not visualized. No aortic stenosis is present.  Aortic dilatation noted. There is mild dilatation of the aortic root, measuring 41 mm. There is mild dilatation of the ascending aorta, measuring 39 mm.  The inferior vena cava is dilated in size with <50% respiratory variability, suggesting right atrial pressure of 15 mmHg.   CT CHEST, ABDOMEN, PELVIS WITH AND/OR WITHOUT CONTRAST performed on 06/22/2021 Aortoiliac atherosclerosis. No flow-limiting stenosis or dissection is seen in the abdominal aorta and most of the branch arteries. There are nonstenosing calcifications in the branch arteries, except for a 50% calcific stenosis in the smaller of 2 left renal arteries. 4.1 cm aneurysmal prominence of the aortic root. Annual CTA or MRA follow-up recommended with routine baseline nonemergent CTA chest recommended since the last chest CT was 03/14/2020. 1.5 cm prominence of the common iliac arteries, with ectatic external and internal iliac arteries. No contrast extravasation into bowel is visible during the study.   Impression and Plan:  Luster Hechler. has been referred for pre-anesthesia review and clearance prior to him undergoing the planned anesthetic and procedural courses. Available labs,  pertinent testing, and imaging results were personally reviewed by me. This patient has been appropriately cleared by cardiology with an overall LOW risk of significant perioperative cardiovascular complications.  Based on clinical review performed today (09/24/21), barring any significant acute changes in the patient's overall condition, it is anticipated that he will be able to proceed with the planned surgical intervention. Any acute changes in clinical condition may necessitate his procedure being postponed and/or cancelled. Patient will meet with anesthesia team (MD and/or CRNA) on the day of his procedure for preoperative evaluation/assessment. Questions regarding anesthetic course will be fielded at that time.   Pre-surgical instructions were reviewed with the patient during his PAT appointment and questions were fielded by PAT clinical staff. Patient was advised that if any questions or concerns arise prior to his procedure then he should return a call to PAT and/or his surgeon's office to discuss.  Honor Loh, MSN, APRN, FNP-C, CEN [facility]  Peri-operative Services Nurse Practitioner Phone: [phone] Fax: [phone] 09/24/21 8:12 AM  NOTE: This note has been prepared using Dragon dictation software. Despite my best ability to proofread, there is always the potential that unintentional transcriptional errors may still occur from this process.

## 2021-09-27 ENCOUNTER — Inpatient Hospital Stay
Admission: RE | Admit: 2021-09-27 | Discharge: 2021-09-30 | DRG: 470 | Disposition: A | Discharge status: Home Health | Payer: Medicare Other | Attending: Orthopedic Surgery | Admitting: Orthopedic Surgery

## 2021-09-27 ENCOUNTER — Inpatient Hospital Stay: Payer: Medicare Other | Admitting: Urgent Care

## 2021-09-27 ENCOUNTER — Inpatient Hospital Stay: Payer: Medicare Other

## 2021-09-27 ENCOUNTER — Encounter
Admission: RE | Disposition: A | Discharge status: Home Health | Payer: Self-pay | Source: Home / Self Care | Attending: Orthopedic Surgery

## 2021-09-27 ENCOUNTER — Other Ambulatory Visit: Payer: Self-pay

## 2021-09-27 ENCOUNTER — Encounter: Payer: Self-pay | Admitting: Orthopedic Surgery

## 2021-09-27 DIAGNOSIS — E785 Hyperlipidemia, unspecified: Secondary | ICD-10-CM | POA: Diagnosis present

## 2021-09-27 DIAGNOSIS — I4891 Unspecified atrial fibrillation: Secondary | ICD-10-CM | POA: Diagnosis present

## 2021-09-27 DIAGNOSIS — Z8546 Personal history of malignant neoplasm of prostate: Secondary | ICD-10-CM

## 2021-09-27 DIAGNOSIS — Z96641 Presence of right artificial hip joint: Principal | ICD-10-CM

## 2021-09-27 DIAGNOSIS — K219 Gastro-esophageal reflux disease without esophagitis: Secondary | ICD-10-CM | POA: Diagnosis present

## 2021-09-27 DIAGNOSIS — Z85828 Personal history of other malignant neoplasm of skin: Secondary | ICD-10-CM

## 2021-09-27 DIAGNOSIS — Z79899 Other long term (current) drug therapy: Secondary | ICD-10-CM | POA: Diagnosis not present

## 2021-09-27 DIAGNOSIS — Z471 Aftercare following joint replacement surgery: Secondary | ICD-10-CM | POA: Diagnosis not present

## 2021-09-27 DIAGNOSIS — I10 Essential (primary) hypertension: Secondary | ICD-10-CM | POA: Diagnosis present

## 2021-09-27 DIAGNOSIS — Z7401 Bed confinement status: Secondary | ICD-10-CM | POA: Diagnosis not present

## 2021-09-27 DIAGNOSIS — Z91048 Other nonmedicinal substance allergy status: Secondary | ICD-10-CM

## 2021-09-27 DIAGNOSIS — R5381 Other malaise: Secondary | ICD-10-CM | POA: Diagnosis not present

## 2021-09-27 DIAGNOSIS — Z8249 Family history of ischemic heart disease and other diseases of the circulatory system: Secondary | ICD-10-CM

## 2021-09-27 DIAGNOSIS — M1611 Unilateral primary osteoarthritis, right hip: Secondary | ICD-10-CM | POA: Diagnosis not present

## 2021-09-27 DIAGNOSIS — Z8673 Personal history of transient ischemic attack (TIA), and cerebral infarction without residual deficits: Secondary | ICD-10-CM

## 2021-09-27 DIAGNOSIS — Z85038 Personal history of other malignant neoplasm of large intestine: Secondary | ICD-10-CM

## 2021-09-27 DIAGNOSIS — I700 Atherosclerosis of aorta: Secondary | ICD-10-CM | POA: Diagnosis present

## 2021-09-27 DIAGNOSIS — I77810 Thoracic aortic ectasia: Secondary | ICD-10-CM | POA: Diagnosis present

## 2021-09-27 DIAGNOSIS — Z87891 Personal history of nicotine dependence: Secondary | ICD-10-CM | POA: Diagnosis not present

## 2021-09-27 DIAGNOSIS — Z7982 Long term (current) use of aspirin: Secondary | ICD-10-CM | POA: Diagnosis not present

## 2021-09-27 DIAGNOSIS — R29898 Other symptoms and signs involving the musculoskeletal system: Secondary | ICD-10-CM | POA: Diagnosis not present

## 2021-09-27 HISTORY — DX: Unspecified right bundle-branch block: I45.10

## 2021-09-27 HISTORY — DX: Other ventricular tachycardia: I47.29

## 2021-09-27 HISTORY — DX: Atherosclerosis of aorta: I70.0

## 2021-09-27 HISTORY — PX: TOTAL HIP ARTHROPLASTY: SHX124

## 2021-09-27 HISTORY — DX: Heart disease, unspecified: I51.9

## 2021-09-27 IMAGING — RF DG HIP (WITH OR WITHOUT PELVIS) 1V*R*
1 series · 2 of 2 positions shown · non-contrast
Comparison: [DATE]

CLINICAL DATA: Fluoroscopic assistance for right hip arthroplasty

EXAM:
DG HIP (WITH OR WITHOUT PELVIS) 1V RIGHT

[Series 1: dg x-ray · 0.20mm/px · 2 of 2 slices shown]
[im 1/2]
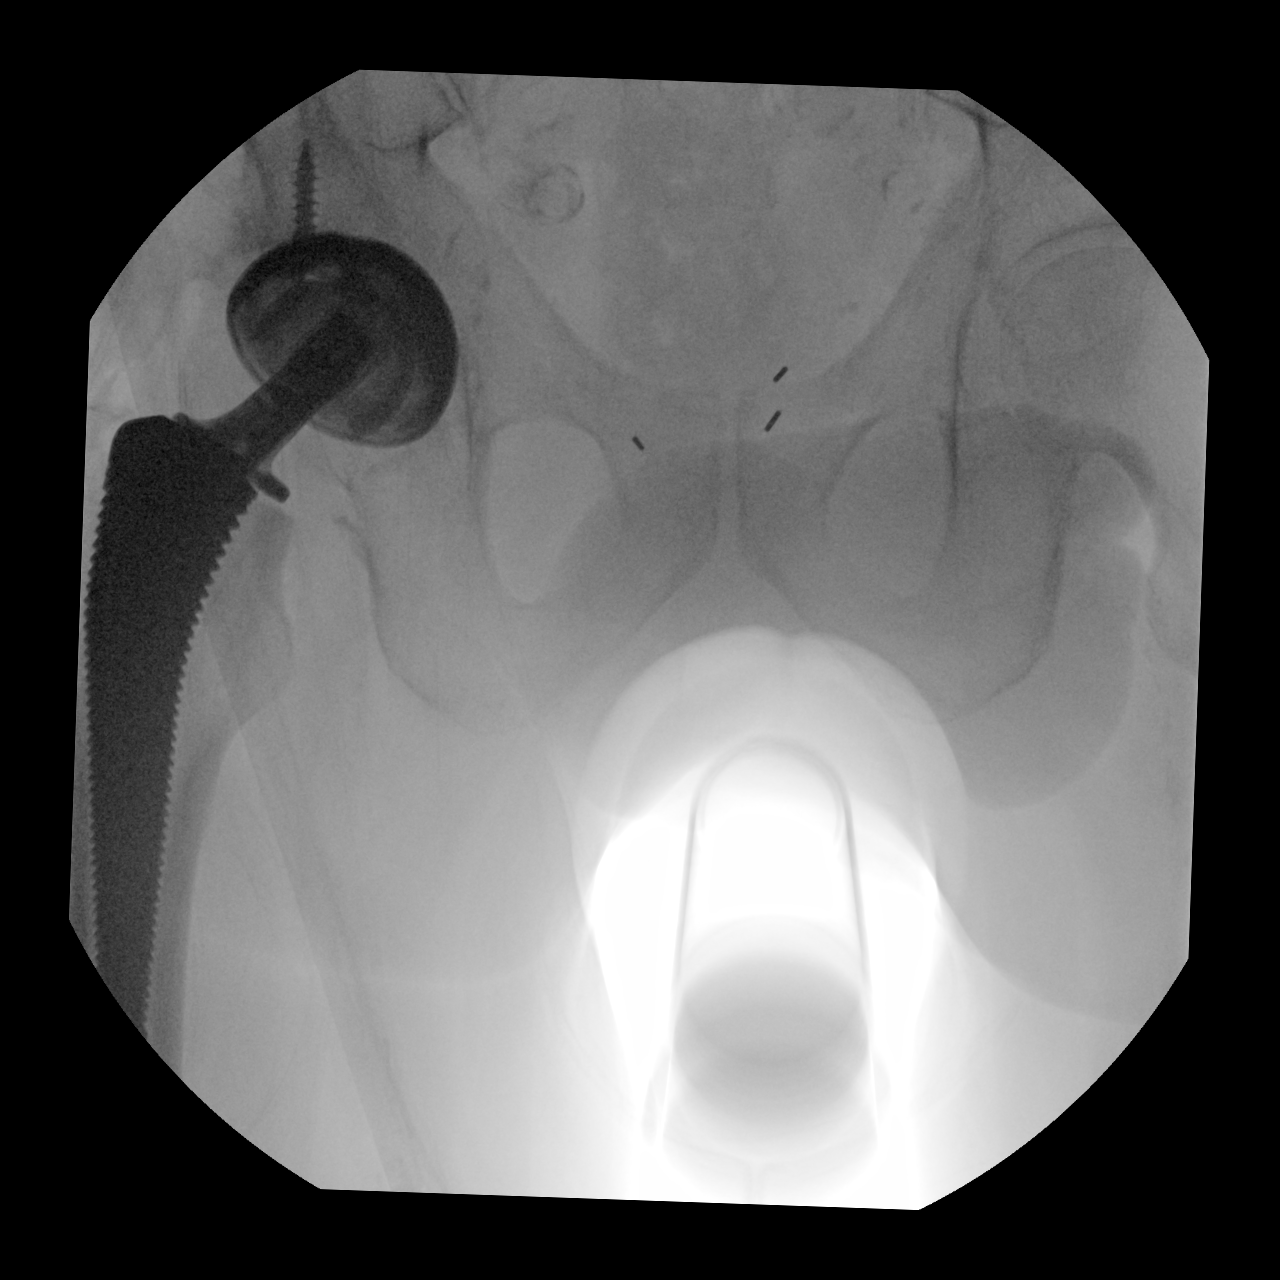
[im 2/2]
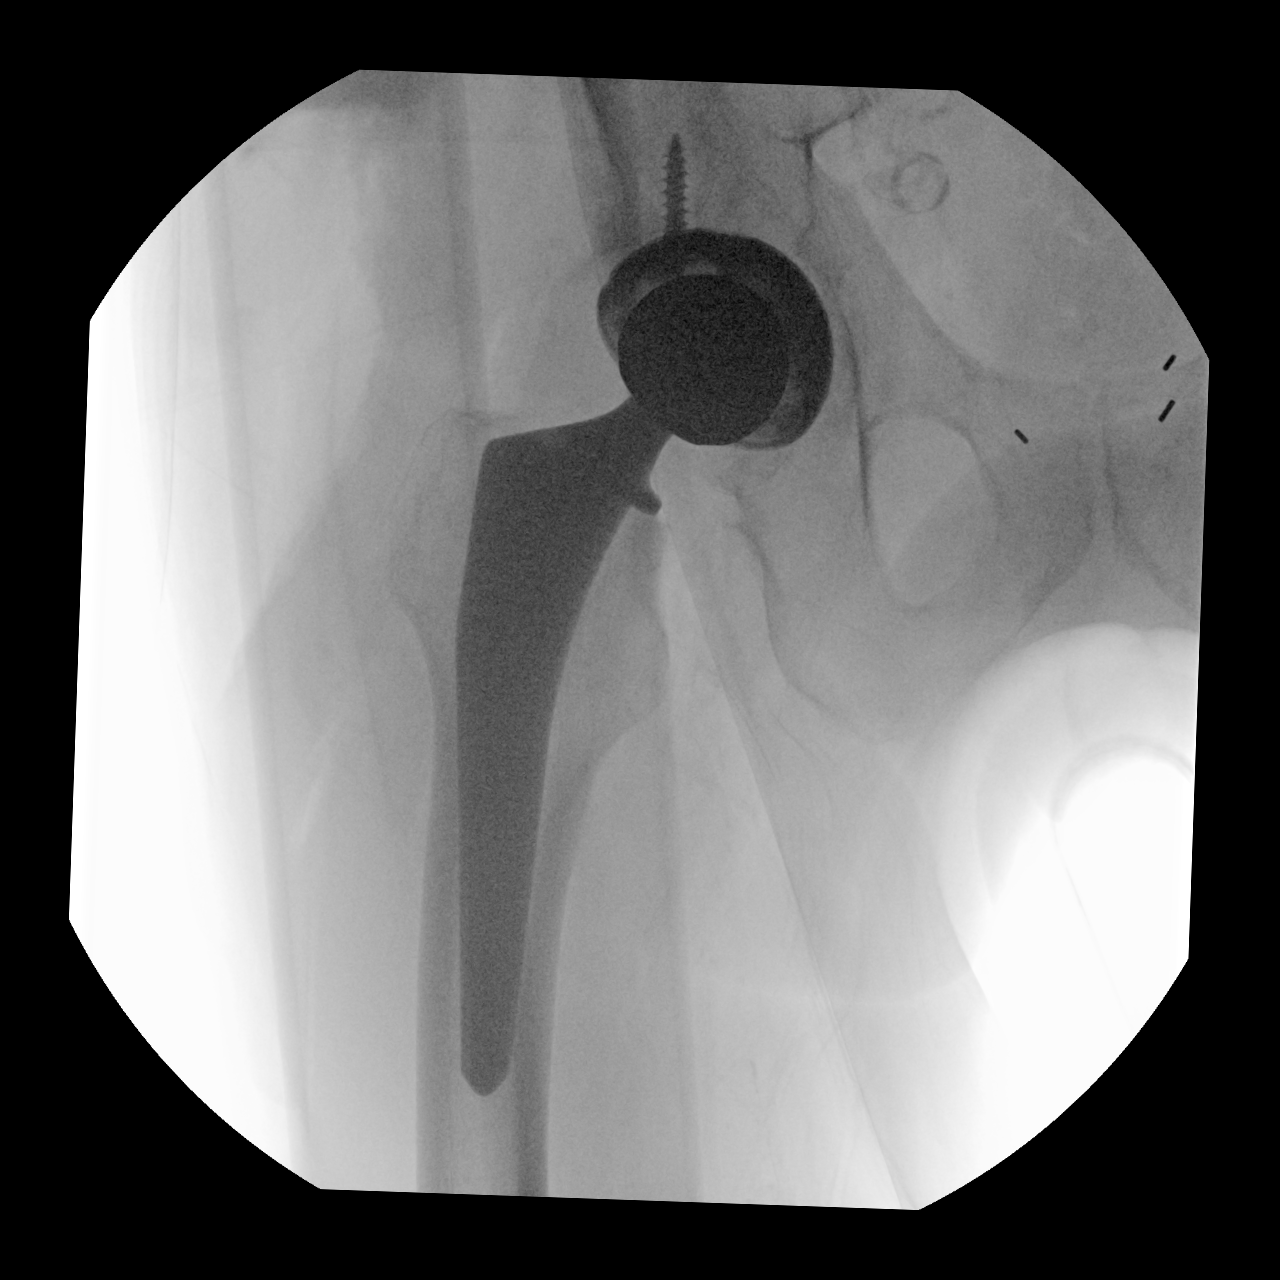

[2 of 2 positions shown; findings below may reference images not displayed]

FINDINGS: Fluoroscopic images show interval right hip arthroplasty.
Fluoroscopic time was 10 seconds. Radiation dose 1.38 mGy.
IMPRESSION: Fluoroscopic assistance was provided for right hip arthroplasty.

## 2021-09-27 SURGERY — ARTHROPLASTY, HIP, TOTAL, ANTERIOR APPROACH
Anesthesia: Spinal | Site: Hip | Laterality: Right

## 2021-09-27 MED ORDER — MENTHOL 3 MG MT LOZG: 1.0000 | LOZENGE | OROMUCOSAL | Status: DC | PRN

## 2021-09-27 MED ORDER — BUPIVACAINE-EPINEPHRINE (PF) 0.25% -1:200000 IJ SOLN
INTRAMUSCULAR | Status: DC | PRN
  Administered 2021-09-27: 30 mL

## 2021-09-27 MED ORDER — PHENOL 1.4 % MT LIQD: 1.0000 | OROMUCOSAL | Status: DC | PRN

## 2021-09-27 MED ORDER — ONDANSETRON HCL 4 MG/2ML IJ SOLN: 4.0000 mg | Freq: Once | INTRAMUSCULAR | Status: DC | PRN

## 2021-09-27 MED ORDER — MIDAZOLAM HCL 2 MG/2ML IJ SOLN
INTRAMUSCULAR | Status: AC
  Filled 2021-09-27: qty 2

## 2021-09-27 MED ORDER — 0.9 % SODIUM CHLORIDE (POUR BTL) OPTIME
TOPICAL | Status: DC | PRN
  Administered 2021-09-27: 1000 mL

## 2021-09-27 MED ORDER — PHENYLEPHRINE HCL-NACL 20-0.9 MG/250ML-% IV SOLN
INTRAVENOUS | Status: DC | PRN
  Administered 2021-09-27: 40 ug/min via INTRAVENOUS

## 2021-09-27 MED ORDER — SERTRALINE HCL 50 MG PO TABS
50.0000 mg | ORAL_TABLET | Freq: Every day | ORAL | Status: DC
  Administered 2021-09-28 – 2021-09-30 (×3): 50 mg via ORAL
  Filled 2021-09-27 (×3): qty 1

## 2021-09-27 MED ORDER — PROPOFOL 10 MG/ML IV BOLUS
INTRAVENOUS | Status: DC | PRN
  Administered 2021-09-27: 30 mg via INTRAVENOUS
  Administered 2021-09-27: 100 ug/kg/min via INTRAVENOUS

## 2021-09-27 MED ORDER — DOCUSATE SODIUM 100 MG PO CAPS
100.0000 mg | ORAL_CAPSULE | Freq: Two times a day (BID) | ORAL | Status: DC
  Administered 2021-09-27 – 2021-09-30 (×5): 100 mg via ORAL
  Filled 2021-09-27 (×6): qty 1

## 2021-09-27 MED ORDER — KETOROLAC TROMETHAMINE 15 MG/ML IJ SOLN
7.5000 mg | Freq: Four times a day (QID) | INTRAMUSCULAR | Status: AC
  Administered 2021-09-27 – 2021-09-28 (×4): 7.5 mg via INTRAVENOUS
  Filled 2021-09-27 (×4): qty 1

## 2021-09-27 MED ORDER — CEFAZOLIN SODIUM-DEXTROSE 2-4 GM/100ML-% IV SOLN
INTRAVENOUS | Status: AC
  Filled 2021-09-27: qty 100

## 2021-09-27 MED ORDER — EPHEDRINE 5 MG/ML INJ
INTRAVENOUS | Status: AC
  Filled 2021-09-27: qty 5

## 2021-09-27 MED ORDER — ONDANSETRON HCL 4 MG PO TABS: 4.0000 mg | ORAL_TABLET | Freq: Four times a day (QID) | ORAL | Status: DC | PRN

## 2021-09-27 MED ORDER — FAMOTIDINE 20 MG PO TABS
ORAL_TABLET | ORAL | Status: AC
  Administered 2021-09-27: 20 mg via ORAL
  Filled 2021-09-27: qty 1

## 2021-09-27 MED ORDER — BUPIVACAINE-EPINEPHRINE (PF) 0.25% -1:200000 IJ SOLN
INTRAMUSCULAR | Status: AC
Start: 2021-09-27 — End: ?
  Filled 2021-09-27: qty 30

## 2021-09-27 MED ORDER — CHLORHEXIDINE GLUCONATE 0.12 % MT SOLN: 15.0000 mL | Freq: Once | OROMUCOSAL | Status: AC

## 2021-09-27 MED ORDER — MORPHINE SULFATE (PF) 2 MG/ML IV SOLN: 0.5000 mg | INTRAVENOUS | Status: DC | PRN

## 2021-09-27 MED ORDER — DEXAMETHASONE SODIUM PHOSPHATE 10 MG/ML IJ SOLN
INTRAMUSCULAR | Status: DC | PRN
  Administered 2021-09-27: 5 mg via INTRAVENOUS

## 2021-09-27 MED ORDER — CHLORHEXIDINE GLUCONATE 0.12 % MT SOLN
OROMUCOSAL | Status: AC
  Administered 2021-09-27: 15 mL via OROMUCOSAL
  Filled 2021-09-27: qty 15

## 2021-09-27 MED ORDER — HYDROCODONE-ACETAMINOPHEN 7.5-325 MG PO TABS
1.0000 | ORAL_TABLET | ORAL | Status: DC | PRN
  Administered 2021-09-30: 1 via ORAL
  Filled 2021-09-27: qty 1

## 2021-09-27 MED ORDER — METOPROLOL TARTRATE 50 MG PO TABS
100.0000 mg | ORAL_TABLET | Freq: Two times a day (BID) | ORAL | Status: DC
  Administered 2021-09-27 – 2021-09-30 (×6): 100 mg via ORAL
  Filled 2021-09-27 (×6): qty 2

## 2021-09-27 MED ORDER — LACTATED RINGERS IV SOLN: INTRAVENOUS | Status: DC

## 2021-09-27 MED ORDER — ASPIRIN 81 MG PO CHEW
81.0000 mg | CHEWABLE_TABLET | Freq: Two times a day (BID) | ORAL | Status: DC
  Administered 2021-09-27 – 2021-09-30 (×6): 81 mg via ORAL
  Filled 2021-09-27 (×6): qty 1

## 2021-09-27 MED ORDER — OXYCODONE HCL 5 MG/5ML PO SOLN: 5.0000 mg | Freq: Once | ORAL | Status: DC | PRN

## 2021-09-27 MED ORDER — ATORVASTATIN CALCIUM 20 MG PO TABS
40.0000 mg | ORAL_TABLET | Freq: Every day | ORAL | Status: DC
  Administered 2021-09-28 – 2021-09-30 (×3): 40 mg via ORAL
  Filled 2021-09-27 (×3): qty 2

## 2021-09-27 MED ORDER — HYDROCODONE-ACETAMINOPHEN 5-325 MG PO TABS
1.0000 | ORAL_TABLET | ORAL | Status: DC | PRN
  Administered 2021-09-27 – 2021-09-28 (×2): 2 via ORAL
  Administered 2021-09-29: 1 via ORAL
  Filled 2021-09-27 (×2): qty 2
  Filled 2021-09-27: qty 1

## 2021-09-27 MED ORDER — PROPOFOL 10 MG/ML IV BOLUS
INTRAVENOUS | Status: AC
  Filled 2021-09-27: qty 20

## 2021-09-27 MED ORDER — ACETAMINOPHEN 10 MG/ML IV SOLN: 1000.0000 mg | Freq: Once | INTRAVENOUS | Status: DC | PRN

## 2021-09-27 MED ORDER — OXYCODONE HCL 5 MG PO TABS: 5.0000 mg | ORAL_TABLET | Freq: Once | ORAL | Status: DC | PRN

## 2021-09-27 MED ORDER — LIDOCAINE HCL (CARDIAC) PF 100 MG/5ML IV SOSY
PREFILLED_SYRINGE | INTRAVENOUS | Status: DC | PRN
  Administered 2021-09-27: 40 mg via INTRAVENOUS

## 2021-09-27 MED ORDER — MIDAZOLAM HCL 5 MG/5ML IJ SOLN
INTRAMUSCULAR | Status: DC | PRN
  Administered 2021-09-27: 1 mg via INTRAVENOUS

## 2021-09-27 MED ORDER — STERILE WATER FOR IRRIGATION IR SOLN
Status: DC | PRN
  Administered 2021-09-27: 1000 mL

## 2021-09-27 MED ORDER — ACETAMINOPHEN 10 MG/ML IV SOLN
INTRAVENOUS | Status: AC
  Filled 2021-09-27: qty 100

## 2021-09-27 MED ORDER — FENTANYL CITRATE (PF) 100 MCG/2ML IJ SOLN: 25.0000 ug | INTRAMUSCULAR | Status: DC | PRN

## 2021-09-27 MED ORDER — METOCLOPRAMIDE HCL 5 MG/ML IJ SOLN: 5.0000 mg | Freq: Three times a day (TID) | INTRAMUSCULAR | Status: DC | PRN

## 2021-09-27 MED ORDER — SODIUM CHLORIDE 0.9 % IR SOLN
Status: DC | PRN
  Administered 2021-09-27: 3000 mL

## 2021-09-27 MED ORDER — BUPIVACAINE HCL (PF) 0.5 % IJ SOLN
INTRAMUSCULAR | Status: DC | PRN
  Administered 2021-09-27: 2.3 mL via INTRATHECAL

## 2021-09-27 MED ORDER — EPHEDRINE SULFATE (PRESSORS) 50 MG/ML IJ SOLN
INTRAMUSCULAR | Status: DC | PRN
  Administered 2021-09-27: 10 mg via INTRAVENOUS

## 2021-09-27 MED ORDER — PROPOFOL 1000 MG/100ML IV EMUL
INTRAVENOUS | Status: AC
  Filled 2021-09-27: qty 100

## 2021-09-27 MED ORDER — DEXAMETHASONE SODIUM PHOSPHATE 10 MG/ML IJ SOLN
INTRAMUSCULAR | Status: AC
  Filled 2021-09-27: qty 1

## 2021-09-27 MED ORDER — PANTOPRAZOLE SODIUM 40 MG PO TBEC
40.0000 mg | DELAYED_RELEASE_TABLET | Freq: Two times a day (BID) | ORAL | Status: DC
  Administered 2021-09-27 – 2021-09-30 (×6): 40 mg via ORAL
  Filled 2021-09-27 (×6): qty 1

## 2021-09-27 MED ORDER — ONDANSETRON HCL 4 MG/2ML IJ SOLN: 4.0000 mg | Freq: Four times a day (QID) | INTRAMUSCULAR | Status: DC | PRN

## 2021-09-27 MED ORDER — DEXMEDETOMIDINE HCL IN NACL 80 MCG/20ML IV SOLN
INTRAVENOUS | Status: AC
  Filled 2021-09-27: qty 20

## 2021-09-27 MED ORDER — MECLIZINE HCL 25 MG PO TABS: 25.0000 mg | ORAL_TABLET | Freq: Every day | ORAL | Status: DC | PRN

## 2021-09-27 MED ORDER — BISACODYL 10 MG RE SUPP: 10.0000 mg | Freq: Every day | RECTAL | Status: DC | PRN

## 2021-09-27 MED ORDER — CEFAZOLIN SODIUM-DEXTROSE 2-4 GM/100ML-% IV SOLN
2.0000 g | Freq: Four times a day (QID) | INTRAVENOUS | Status: AC
  Administered 2021-09-27 (×2): 2 g via INTRAVENOUS
  Filled 2021-09-27 (×2): qty 100

## 2021-09-27 MED ORDER — TRANEXAMIC ACID-NACL 1000-0.7 MG/100ML-% IV SOLN
INTRAVENOUS | Status: AC
  Filled 2021-09-27: qty 100

## 2021-09-27 MED ORDER — METOCLOPRAMIDE HCL 5 MG PO TABS: 5.0000 mg | ORAL_TABLET | Freq: Three times a day (TID) | ORAL | Status: DC | PRN

## 2021-09-27 MED ORDER — ALUM & MAG HYDROXIDE-SIMETH 200-200-20 MG/5ML PO SUSP: 30.0000 mL | ORAL | Status: DC | PRN

## 2021-09-27 MED ORDER — TRANEXAMIC ACID-NACL 1000-0.7 MG/100ML-% IV SOLN
1000.0000 mg | INTRAVENOUS | Status: AC
  Administered 2021-09-27: 1000 mg via INTRAVENOUS

## 2021-09-27 MED ORDER — MAGNESIUM HYDROXIDE 400 MG/5ML PO SUSP
30.0000 mL | Freq: Every day | ORAL | Status: DC | PRN
  Administered 2021-09-27 – 2021-09-28 (×2): 30 mL via ORAL
  Filled 2021-09-27 (×2): qty 30

## 2021-09-27 MED ORDER — LIDOCAINE HCL (PF) 2 % IJ SOLN
INTRAMUSCULAR | Status: AC
Start: 2021-09-27 — End: ?
  Filled 2021-09-27: qty 5

## 2021-09-27 MED ORDER — VITAMIN B-12 1000 MCG PO TABS
1000.0000 ug | ORAL_TABLET | Freq: Every day | ORAL | Status: DC
  Administered 2021-09-28 – 2021-09-30 (×3): 1000 ug via ORAL
  Filled 2021-09-27 (×4): qty 1

## 2021-09-27 MED ORDER — ACETAMINOPHEN 10 MG/ML IV SOLN
INTRAVENOUS | Status: DC | PRN
  Administered 2021-09-27: 500 mg via INTRAVENOUS

## 2021-09-27 MED ORDER — CEFAZOLIN SODIUM-DEXTROSE 2-4 GM/100ML-% IV SOLN
2.0000 g | INTRAVENOUS | Status: AC
  Administered 2021-09-27: 2 g via INTRAVENOUS

## 2021-09-27 MED ORDER — LISINOPRIL 10 MG PO TABS
10.0000 mg | ORAL_TABLET | Freq: Every day | ORAL | Status: DC
  Administered 2021-09-28 – 2021-09-30 (×3): 10 mg via ORAL
  Filled 2021-09-27 (×3): qty 1

## 2021-09-27 MED ORDER — FLUTICASONE PROPIONATE 50 MCG/ACT NA SUSP: 1.0000 | Freq: Every day | NASAL | Status: DC | PRN

## 2021-09-27 MED ORDER — FOLIC ACID 1 MG PO TABS
1.0000 mg | ORAL_TABLET | Freq: Every day | ORAL | Status: DC
  Administered 2021-09-28 – 2021-09-30 (×3): 1 mg via ORAL
  Filled 2021-09-27 (×3): qty 1

## 2021-09-27 MED ORDER — ONDANSETRON HCL 4 MG/2ML IJ SOLN
INTRAMUSCULAR | Status: AC
  Filled 2021-09-27: qty 2

## 2021-09-27 MED ORDER — FAMOTIDINE 20 MG PO TABS: 20.0000 mg | ORAL_TABLET | Freq: Once | ORAL | Status: AC

## 2021-09-27 MED ORDER — POVIDONE-IODINE 10 % EX SWAB
2.0000 "application " | Freq: Once | CUTANEOUS | Status: AC
  Administered 2021-09-27: 2 via TOPICAL

## 2021-09-27 MED ORDER — ACETAMINOPHEN 325 MG PO TABS
325.0000 mg | ORAL_TABLET | Freq: Four times a day (QID) | ORAL | Status: DC | PRN
  Administered 2021-09-28 – 2021-09-29 (×4): 650 mg via ORAL
  Filled 2021-09-27 (×4): qty 2

## 2021-09-27 MED ORDER — ONDANSETRON HCL 4 MG/2ML IJ SOLN
INTRAMUSCULAR | Status: DC | PRN
  Administered 2021-09-27: 4 mg via INTRAVENOUS

## 2021-09-27 MED ORDER — SURGIRINSE WOUND IRRIGATION SYSTEM - OPTIME
TOPICAL | Status: DC | PRN
  Administered 2021-09-27: 900 mL

## 2021-09-27 MED ORDER — ORAL CARE MOUTH RINSE: 15.0000 mL | Freq: Once | OROMUCOSAL | Status: AC

## 2021-09-27 SURGICAL SUPPLY — 58 items
BLADE SAGITTAL WIDE XTHICK NO (BLADE) ×2 IMPLANT
BNDG COHESIVE 4X5 TAN ST LF (GAUZE/BANDAGES/DRESSINGS) ×4 IMPLANT
BRUSH SCRUB EZ  4% CHG (MISCELLANEOUS) ×1
BRUSH SCRUB EZ 4% CHG (MISCELLANEOUS) ×1 IMPLANT
CHLORAPREP W/TINT 26 (MISCELLANEOUS) ×2 IMPLANT
COVER HOLE (Hips) ×1 IMPLANT
CUP R3 52MM (Hips) ×1 IMPLANT
DRAPE 3/4 80X56 (DRAPES) ×2 IMPLANT
DRAPE C-ARM 42X72 X-RAY (DRAPES) ×2 IMPLANT
DRAPE STERI IOBAN 125X83 (DRAPES) IMPLANT
DRAPE U-SHAPE 47X51 STRL (DRAPES) ×2 IMPLANT
DRSG AQUACEL AG ADV 3.5X10 (GAUZE/BANDAGES/DRESSINGS) ×1 IMPLANT
DRSG AQUACEL AG ADV 3.5X14 (GAUZE/BANDAGES/DRESSINGS) IMPLANT
ELECT REM PT RETURN 9FT ADLT (ELECTROSURGICAL) ×2
ELECTRODE REM PT RTRN 9FT ADLT (ELECTROSURGICAL) ×1 IMPLANT
GAUZE 4X4 16PLY ~~LOC~~+RFID DBL (SPONGE) ×2 IMPLANT
GAUZE XEROFORM 1X8 LF (GAUZE/BANDAGES/DRESSINGS) ×1 IMPLANT
GLOVE BIO SURGEON STRL SZ8 (GLOVE) ×2 IMPLANT
GLOVE BIOGEL PI IND STRL 8.5 (GLOVE) ×2 IMPLANT
GLOVE BIOGEL PI INDICATOR 8.5 (GLOVE) ×2
GLOVE SURG ORTHO 8.5 STRL (GLOVE) ×2 IMPLANT
GOWN STRL REUS W/ TWL XL LVL3 (GOWN DISPOSABLE) ×2 IMPLANT
GOWN STRL REUS W/TWL XL LVL3 (GOWN DISPOSABLE) ×2
HEAD FEM KNEE TAPER 36MM XS-3 (Head) ×1 IMPLANT
HEAD FEMORAL TAPER 36MM P0 (Head) ×1 IMPLANT
HOLSTER ELECTROSUGICAL PENCIL (MISCELLANEOUS) ×2 IMPLANT
HOOD PEEL AWAY FLYTE STAYCOOL (MISCELLANEOUS) ×6 IMPLANT
IV NS 250ML (IV SOLUTION)
IV NS 250ML BAXH (IV SOLUTION) IMPLANT
IV NS IRRIG 3000ML ARTHROMATIC (IV SOLUTION) ×2 IMPLANT
KIT PATIENT CARE HANA TABLE (KITS) ×2 IMPLANT
KIT TURNOVER CYSTO (KITS) ×2 IMPLANT
LINER ACETAB 0 DEG (Liner) ×1 IMPLANT
MANIFOLD NEPTUNE II (INSTRUMENTS) ×2 IMPLANT
MAT ABSORB  FLUID 56X50 GRAY (MISCELLANEOUS) ×1
MAT ABSORB FLUID 56X50 GRAY (MISCELLANEOUS) ×1 IMPLANT
NDL SAFETY ECLIPSE 18X1.5 (NEEDLE) IMPLANT
NDL SPNL 20GX3.5 QUINCKE YW (NEEDLE) ×1 IMPLANT
NEEDLE HYPO 18GX1.5 SHARP (NEEDLE)
NEEDLE HYPO 22GX1.5 SAFETY (NEEDLE) ×2 IMPLANT
NEEDLE SPNL 20GX3.5 QUINCKE YW (NEEDLE) ×2 IMPLANT
PACK HIP PROSTHESIS (MISCELLANEOUS) ×2 IMPLANT
PADDING CAST BLEND 4X4 NS (MISCELLANEOUS) ×4 IMPLANT
PILLOW ABDUCTION MEDIUM (MISCELLANEOUS) ×2 IMPLANT
PULSAVAC PLUS IRRIG FAN TIP (DISPOSABLE) ×2
SCREW 6.5X25MM (Screw) ×1 IMPLANT
SOLUTION IRRIG SURGIPHOR (IV SOLUTION) ×2 IMPLANT
SPONGE T-LAP 18X18 ~~LOC~~+RFID (SPONGE) ×8 IMPLANT
STAPLER SKIN PROX 35W (STAPLE) ×2 IMPLANT
STEM LATERAL W/COLLAR SZ LAT6 (Stem) ×1 IMPLANT
SUT BONE WAX W31G (SUTURE) ×2 IMPLANT
SUT DVC 2 QUILL PDO  T11 36X36 (SUTURE) ×1
SUT DVC 2 QUILL PDO T11 36X36 (SUTURE) ×1 IMPLANT
SUT VIC AB 2-0 CT1 18 (SUTURE) ×2 IMPLANT
SYR 20ML LL LF (SYRINGE) ×2 IMPLANT
TIP FAN IRRIG PULSAVAC PLUS (DISPOSABLE) ×1 IMPLANT
WAND WEREWOLF FASTSEAL 6.0 (MISCELLANEOUS) ×2 IMPLANT
WATER STERILE IRR 500ML POUR (IV SOLUTION) ×2 IMPLANT

## 2021-09-27 NOTE — Progress Notes (Signed)
Foley catheter insertion attempted x 2 by this RN and C. Haizlip RN.  Resistance met even with positoning, small amount of urine return. Dr. Harlow Mares notified and Urology consult ordered.  Within 5 minutes spoke to Dr. Erlene Quan who requested 18 fr. Coude catheter inserted. During conversation with Dr. Erlene Quan patient voided 200 ml clear yellow urine.  Dr. Erlene Quan stated since this was over half of amount present on bladder scan that patient does not need foley catheter at this time.

## 2021-09-27 NOTE — H&P (Signed)
The patient has been re-examined, and the chart reviewed, and there have been no interval changes to the documented history and physical.  Plan a right total hip today.  Anesthesia is not consulted regarding a peripheral nerve block for post-operative pain.  The risks, benefits, and alternatives have been discussed at length, and the patient is willing to proceed.

## 2021-09-27 NOTE — Anesthesia Procedure Notes (Signed)
Spinal  Patient location during procedure: OR Start time: 09/27/2021 12:32 PM End time: 09/27/2021 12:38 PM Reason for block: surgical anesthesia Staffing Resident/CRNA: Cammie Sickle, CRNA Performed by: Cammie Sickle, CRNA Authorized by: Arita Miss, MD   Preanesthetic Checklist Completed: patient identified, IV checked, site marked, risks and benefits discussed, surgical consent, monitors and equipment checked, pre-op evaluation and timeout performed Spinal Block Patient position: sitting Prep: ChloraPrep Patient monitoring: heart rate, continuous pulse ox, blood pressure and cardiac monitor Approach: midline Location: L4-5 Injection technique: single-shot Needle Needle type: Introducer and Pencan  Needle gauge: 24 G Needle length: 10 cm Assessment Sensory level: T6 Events: CSF return Additional Notes Negative paresthesia. Negative blood return. Positive free-flowing CSF. Expiration date of kit checked and confirmed. Patient tolerated procedure well, without complications.

## 2021-09-27 NOTE — Progress Notes (Signed)
PT Cancellation Note  Patient Details Name: Cameron Foster. MRN: [id number] DOB: [birth year]   Cancelled Treatment:    Reason Eval/Treat Not Completed: Other (comment): Per nursing pt to remain in PACU for urology consult related to his foley catheter.  Nursing also reported pt able to wiggle toes with some sensation to light touch slowly returning but not yet appropriate for PT services. Will attempt to see pt tomorrow morning as medically appropriate.    Linus Salmons PT, DPT 09/27/21, 4:34 PM

## 2021-09-27 NOTE — Op Note (Signed)
09/27/2021  2:14 PM  PATIENT:  Cameron Foster.   MRN: [id number]  PRE-OPERATIVE DIAGNOSIS:  Osteoarthritis right hip   POST-OPERATIVE DIAGNOSIS: Same  Procedure: Right Total Hip Replacement  Surgeon: Elyn Aquas. Harlow Mares, MD   Assist: Carlynn Spry, PA-C  Anesthesia: Spinal   EBL: 100 mL   Specimens: None   Drains: None   Components used: A size 6 lateral Polarstem [employer]hew, R3 size 52 mm shell, and a 36 mm +0 mm head    Description of the procedure in detail: After informed consent was obtained and the appropriate extremity marked in the pre-operative holding area, the patient was taken to the operating room and placed in the supine position on the fracture table. All pressure points were well padded and bilateral lower extremities were place in traction spars. The hip was prepped and draped in standard sterile fashion. A spinal anesthetic had been delivered by the anesthesia team. The skin and subcutaneous tissues were injected with a mixture of Marcaine with epinephrine for post-operative pain. A longitudinal incision approximately 10 cm in length was carried out from the anterior superior iliac spine to the greater trochanter. The tensor fascia was divided and blunt dissection was taken down to the level of the joint capsule. The lateral circumflex vessels were cauterized. Deep retractors were placed and a portion of the anterior capsule was excised. Using fluoroscopy the neck cut was planned and carried out with a sagittal saw. The head was passed from the field with use of a corkscrew and hip skid. Deep retractors were placed along the acetabulum and the degenerative labrum and large osteophytes were removed with a Rongeur. The cup was sequentially reamed to a size 52 mm. The wound was irrigated and using fluoroscopy the size 52 mm cup was impacted in to anatomic position. A single screw was placed followed by a threaded hole cover. The final liner was impacted in to position.  Attention was then turned to the proximal femur. The leg was placed in extension and external rotation. The canal was opened and sequentially broached to a size 6 lateral. The trial components were placed and the hip relocated. The components were found to be in good position using fluoroscopy. The hip was dislocated and the trial components removed. The final components were impacted in to position and the hip relocated. The final components were again check with fluoroscopy and found to be in good position. Hemostasis was achieved with electrocautery. The deep capsule was injected with Marcaine and epinephrine. The wound was irrigated with bacitracin laced normal saline and the tensor fascia closed with #2 Quill suture. The subcutaneous tissues were closed with 2-0 vicryl and staples for the skin. A sterile dressing was applied and an abduction pillow. Patient tolerated the procedure well and there were no apparent complication. Patient was taken to the recovery room in good condition.   Kurtis Bushman, MD

## 2021-09-27 NOTE — Transfer of Care (Signed)
Immediate Anesthesia Transfer of Care Note  Patient: Cameron Foster.  Procedure(s) Performed: TOTAL HIP ARTHROPLASTY ANTERIOR APPROACH (Right: Hip)  Patient Location: PACU  Anesthesia Type:Spinal  Level of Consciousness: drowsy  Airway & Oxygen Therapy: Patient Spontanous Breathing and Patient connected to face mask oxygen  Post-op Assessment: Report given to RN and Post -op Vital signs reviewed and stable  Post vital signs: Reviewed and stable  Last Vitals:  Vitals Value Taken Time  BP 116/70 09/27/21 1421  Temp 36.6 C 09/27/21 1420  Pulse 56 09/27/21 1426  Resp 23 09/27/21 1426  SpO2 100 % 09/27/21 1426  Vitals shown include unvalidated device data.  Last Pain:  Vitals:   09/27/21 1012  TempSrc: Temporal  PainSc: 0-No pain         Complications: No notable events documented.

## 2021-09-27 NOTE — Anesthesia Preprocedure Evaluation (Addendum)
Anesthesia Evaluation  Patient identified by MRN, date of birth, ID band Patient awake    Reviewed: Allergy & Precautions, NPO status , Patient's Chart, lab work & pertinent test results  History of Anesthesia Complications Negative for: history of anesthetic complications  Airway Mallampati: III  TM Distance: >3 FB Neck ROM: Full    Dental  (+) Partial Upper   Pulmonary neg pulmonary ROS, neg sleep apnea, neg COPD, Patient abstained from smoking.Not current smoker, former smoker,  + chewing tobacco; not used in 2 weeks   Pulmonary exam normal breath sounds clear to auscultation       Cardiovascular Exercise Tolerance: Good METShypertension, Pt. on medications (-) CAD and (-) Past MI + dysrhythmias Atrial Fibrillation  Rhythm:Regular Rate:Bradycardia - Systolic murmurs ? Patient suffered a spontaneous subarachnoid hemorrhage on 10/10/2018.  Patient was [facility] to [facility] for ongoing care and management. Cerebral hemorrhage resolved without need for surgical intervention.  ? Patient suffered an acute lacunar infarct on 06/08/2019.  Imaging revealed small infarction located at the RIGHT motor strip.  Patient has no residual deficits from this neurological event.  ? Patient with a recurrent episode of atrial fibrillation in the setting of acute GI bleeding related to colitis in 06/2021.  He was treated with IV amiodarone, which prompted pharmacological cardioversion.  Patient went on apixaban short-term.  Long-term cardiac event monitor study was performed revealing a predominant underlying sinus rhythm with an average heart rate of 74 bpm (range 54-120 bpm).  There were occasional PACs (about 4% burden) and rare PVCs noted.  There were 125 episodes of PSVT noted with the longest lasting 17.5 seconds at a maximum heart rate of 187 bpm.  There were no sustained arrhythmias or prolonged pauses noted.  Given the absence of atrial  fibrillation on the event monitor study, and in the setting of GI bleed, patient was transitioned off of oral anticoagulation and placed on daily low-dose ASA.  ? Last TTE was performed on 06/26/2021 revealing a mildly reduced left ventricular systolic function with moderate concentric LVH; LVEF was 45-50%.  Left atrium was mildly dilated.  There was mild mitral valve regurgitation.  Aortic dilatation noted; 41 mm root and 39 mm ascending aorta.  There was no evidence of a significant transvalvular gradient to suggest stenosis.   Neuro/Psych PSYCHIATRIC DISORDERS Depression CVA, No Residual Symptoms    GI/Hepatic PUD, GERD  Controlled,(+)     (-) substance abuse  ,   Endo/Other  neg diabetes  Renal/GU CRFRenal disease     Musculoskeletal  (+) Arthritis ,   Abdominal   Peds  Hematology   Anesthesia Other Findings Past Medical History: No date: Alcoholism ([place]) No date: Aortic atherosclerosis ([place]) No date: Arthritis 03/28/2018: Ascending aorta dilation (HCC)     Comment:  a.) Vascular US: prox asc Ao measured 29 mm. b.)  CT CAP              06/22/2021: Ao root 41 mm. c.) TTE 06/26/2021: Ao root 41              mm; asc Ao 39 mm 1995: Atrial fibrillation (HCC)     Comment:  a.) single episode in 1995 per patient; no long term               treatment. b.) recurrent episode in the setting of GI               bleeding related to colitis 06/2021. 04/26/2017: Basal cell carcinoma  Comment:  Right medial cheek. Superficial and nodular 06/11/2019: Basal cell carcinoma     Comment:  Left anterior shoulder. Nodular pattern 09/09/2019: Basal cell carcinoma     Comment:  Right nasal ala, EDC 02/05/2020: Basal cell carcinoma     Comment:  L upper eyebrow, EDC  06/09/2019: Bilateral carotid artery stenosis     Comment:  a.) Carotid doppler: mod; <50% BILATERAL ICAs. No date: Chicken pox No date: Colon cancer ([place]) No date: Colon polyp No date: Depression     Comment:  Son died  Jul 09, 2014 No date: Diverticulitis 31 years: Diverticulosis No date: Gastritis No date: GERD (gastroesophageal reflux disease) No date: H. pylori infection No date: History of stress test     Comment:  a. 09/2015 MV: No ischemia/infarct. EF 45-54% (nl by               echo). No date: Hyperlipidemia No date: Hypertension No date: Irritable bowel syndrome 06/08/2019: Lacunar infarction [facility])     Comment:  a.) small; RIGHT motor strip No date: NSVT (nonsustained ventricular tachycardia) ([place])     Comment:  a.)  Single episode lasting 5 beats at a maximum rate of              160 bpm noted on Holter study performed 08/13/2021. 2010-07-09: Prostate cancer ([place])     Comment:  a.) s/p XRT No date: PSVT (paroxysmal supraventricular tachycardia) ([place])     Comment:  a. 06/2019 Zio: Avg rate 74 (54-120), occas PACs, rare               PVCs, 125 episodes of PVCs (longest 17.5 secs; max rate               187). No afib. No date: RBBB 10/10/2018: SAH (subarachnoid hemorrhage) ([place]) 07/18/2017: Squamous cell carcinoma of skin     Comment:  Left medial calf. KA type 04/24/2018: Squamous cell carcinoma of skin     Comment:  Right above med. brow 06/11/2019: Squamous cell carcinoma of skin     Comment:  Right posterior shoulder. SCCis, hypertrophic 01/09/2020: Squamous cell carcinoma of skin     Comment:  Mid nasal dorsum, MOHS, Efudex x 4wks No date: Systolic dysfunction     Comment:  a.) TTE 10/05/2015: EF 50-55%; mild LVH; LAE; triv AR,               mild MR. b.) TTE 03/29/2018: EF 55-60%; LAE, mild AR; ?               small PFO. c.) TTE 06/09/2019: EF 55-60%, no rwma, triv               MR/AI. d.) TTE 06/26/2021: EF 45-50%; glob HK; LAE; mild               MR; Ao root 41 mm; asc Ao 39 mm. No date: Vasovagal syncope  Reproductive/Obstetrics                            Anesthesia Physical Anesthesia Plan  ASA: 3  Anesthesia Plan: Spinal   Post-op Pain Management: Ofirmev IV  (intra-op)*   Induction: Intravenous  PONV Risk Score and Plan: 1 and Ondansetron, Dexamethasone, Propofol infusion, TIVA and Treatment may vary due to age or medical condition  Airway Management Planned: Natural Airway  Additional Equipment: None  Intra-op Plan:   Post-operative Plan:   Informed Consent: I have reviewed the patients  History and Physical, chart, labs and discussed the procedure including the risks, benefits and alternatives for the proposed anesthesia with the patient or authorized representative who has indicated his/her understanding and acceptance.       Plan Discussed with: CRNA and Surgeon  Anesthesia Plan Comments: (Discussed R/B/A of neuraxial anesthesia technique with patient: - rare risks of spinal/epidural hematoma, nerve damage, infection - Risk of PDPH - Risk of nausea and vomiting - Risk of conversion to general anesthesia and its associated risks, including sore throat, damage to lips/eyes/teeth/oropharynx, and rare risks such as cardiac and respiratory events. - Risk of allergic reactions  Discussed the role of CRNA in patient's perioperative care.  Patient voiced understanding.)        Anesthesia Quick Evaluation

## 2021-09-28 ENCOUNTER — Encounter: Payer: Self-pay | Admitting: Orthopedic Surgery

## 2021-09-28 NOTE — Evaluation (Signed)
Physical Therapy Evaluation Patient Details Name: Cameron Foster. MRN: [id number] DOB: Jul 23, 1940 Today's Date: 09/28/2021  History of Present Illness  Pt is a 81 yo M s/p R THA 09/27/21. PMH includes HTN, Afib, CAD, CVA, GERD, cancer, HLD  Clinical Impression  Pt was pleasant and motivated to participate during the session and put forth good effort throughout. Pt is able to complete bed mobility w/ supervision with extra time and effort to complete task. Pt unable to come to stand from standard height bed after given multiple attempts; was able to complete sit to stand CGA following verbal cuing for hand placement to initiate stand and with bed elevated; some unsteadiness completing stand and small LOB but able to self correct. Pt was able to ambulate out into hallway 69f using RW CGA and required verbal cuing to maintain walker close to BOS. Pt able to tolerate weightbearing evenly through RLE. Some leakage was noted to incision site with nursing notified. Given pt's recent change in function and potentially not having appropriate assist from wife at home due to her comorbidities pt will benefit from PT services in a SNF setting upon discharge to safely address deficits listed in patient problem list for decreased caregiver assistance and eventual return to PLOF.      Recommendations for follow up therapy are one component of a multi-disciplinary discharge planning process, led by the attending physician.  Recommendations may be updated based on patient status, additional functional criteria and insurance authorization.  Follow Up Recommendations Skilled nursing-short term rehab (<3 hours/day)    Assistance Recommended at Discharge Intermittent Supervision/Assistance  Patient can return home with the following  A little help with walking and/or transfers;A little help with bathing/dressing/bathroom;Assistance with cooking/housework;Help with stairs or ramp for entrance    Equipment  Recommendations BSC/3in1  Recommendations for Other Services       Functional Status Assessment Patient has had a recent decline in their functional status and demonstrates the ability to make significant improvements in function in a reasonable and predictable amount of time.     Precautions / Restrictions Precautions Precautions: Anterior Hip Precaution Booklet Issued: Yes (comment) Restrictions Weight Bearing Restrictions: Yes RLE Weight Bearing: Weight bearing as tolerated      Mobility  Bed Mobility Overal bed mobility: Needs Assistance Bed Mobility: Supine to Sit     Supine to sit: Supervision     General bed mobility comments: extra time and effort to complete    Transfers Overall transfer level: Needs assistance Equipment used: Rolling walker (2 wheels) Transfers: Sit to/from Stand Sit to Stand: Min guard           General transfer comment: verbal cuing for hand placement    Ambulation/Gait Ambulation/Gait assistance: Min guard Gait Distance (Feet): 50 Feet Assistive device: Rolling walker (2 wheels) Gait Pattern/deviations: Step-through pattern, Decreased step length - right, Decreased step length - left Gait velocity: decreased     General Gait Details: verbal cuing to maintain RW close to BOS  Stairs            Wheelchair Mobility    Modified Rankin (Stroke Patients Only)       Balance Overall balance assessment: Needs assistance Sitting-balance support: Feet supported, Bilateral upper extremity supported Sitting balance-Leahy Scale: Good     Standing balance support: Bilateral upper extremity supported, During functional activity Standing balance-Leahy Scale: Fair Standing balance comment: min LOB posteriorly when first standing up, able to self correct  Pertinent Vitals/Pain Pain Assessment Pain Assessment: 0-10 Pain Score: 2  Pain Location: R hip Pain Descriptors / Indicators: Aching,  Discomfort Pain Intervention(s): Monitored during session, Premedicated before session, Repositioned    Home Living Family/patient expects to be discharged to:: Private residence Living Arrangements: Spouse/significant other (wife) Available Help at Discharge: Family;Available 24 hours/day Type of Home: House Home Access: Level entry       Home Layout: One level Home Equipment: Cane - single Barista (2 wheels);Rollator (4 wheels)      Prior Function               Mobility Comments: independent community ambulate using SPC and no hx of falls in last 6 months ADLs Comments: independent with ADLs and use of meals on wheels     Hand Dominance        Extremity/Trunk Assessment   Upper Extremity Assessment Upper Extremity Assessment: Overall WFL for tasks assessed    Lower Extremity Assessment Lower Extremity Assessment: Generalized weakness       Communication   Communication: No difficulties  Cognition Arousal/Alertness: Awake/alert Behavior During Therapy: WFL for tasks assessed/performed Overall Cognitive Status: Within Functional Limits for tasks assessed                                          General Comments General comments (skin integrity, edema, etc.): Some leakage noted at incision site, nursing notified    Exercises Total Joint Exercises Ankle Circles/Pumps: Strengthening, Both, 10 reps Quad Sets: Strengthening, Both, 10 reps Gluteal Sets: Strengthening, Both, 10 reps Other Exercises Other Exercises: HEP per handout Other Exercises: education of anterior hip precuations to decrease risk of injury to surgical site   Assessment/Plan    PT Assessment Patient needs continued PT services  PT Problem List Decreased strength;Decreased mobility;Decreased activity tolerance;Decreased balance;Decreased safety awareness;Decreased knowledge of precautions;Decreased knowledge of use of DME       PT Treatment Interventions  DME instruction;Therapeutic exercise;Balance training;Gait training;Stair training;Functional mobility training;Therapeutic activities;Patient/family education    PT Goals (Current goals can be found in the Care Plan section)  Acute Rehab PT Goals Patient Stated Goal: pt would like to get back to independent walking and playing golf PT Goal Formulation: With patient Time For Goal Achievement: 10/11/21 Potential to Achieve Goals: Good    Frequency BID     Co-evaluation               AM-PAC PT "6 Clicks" Mobility  Outcome Measure Help needed turning from your back to your side while in a flat bed without using bedrails?: A Little Help needed moving from lying on your back to sitting on the side of a flat bed without using bedrails?: A Little Help needed moving to and from a bed to a chair (including a wheelchair)?: A Little Help needed standing up from a chair using your arms (e.g., wheelchair or bedside chair)?: A Little Help needed to walk in hospital room?: A Little Help needed climbing 3-5 steps with a railing? : A Little 6 Click Score: 18    End of Session Equipment Utilized During Treatment: Gait belt Activity Tolerance: Patient tolerated treatment well Patient left: in chair;with call bell/phone within reach;with chair alarm set;with nursing/sitter in room Nurse Communication: Mobility status PT Visit Diagnosis: Other abnormalities of gait and mobility (R26.89);Muscle weakness (generalized) (M62.81);Unsteadiness on feet (R26.81);Pain Pain - Right/Left: Right Pain - part  of body: Hip    Time: 6073-7106 PT Time Calculation (min) (ACUTE ONLY): 34 min   Charges:              Turner Daniels, SPT  09/28/2021, 12:55 PM

## 2021-09-28 NOTE — Progress Notes (Signed)
  Subjective:  Patient reports pain as moderate.    Objective:   VITALS:   Vitals:   09/27/21 1814 09/27/21 2034 09/27/21 2355 09/28/21 0554  BP: (!) 185/99 (!) 160/93 118/73 123/71  Pulse: 64 74 60 (!) 58  Resp:  16 17 17   Temp:  97.9 F (36.6 C) 97.8 F (36.6 C) 98 F (36.7 C)  TempSrc:   Oral   SpO2:  98% 96% 96%  Weight:      Height:        PHYSICAL EXAM:  Neurologically intact ABD soft Neurovascular intact Sensation intact distally Intact pulses distally Dorsiflexion/Plantar flexion intact Incision: moderate drainage and drsg changed No cellulitis present Compartment soft  LABS  No results found for this or any previous visit (from the past 24 hour(s)).  DG HIP UNILAT WITH PELVIS 1V RIGHT  Result Date: 09/27/2021 CLINICAL DATA:  Fluoroscopic assistance for right hip arthroplasty EXAM: DG HIP (WITH OR WITHOUT PELVIS) 1V RIGHT COMPARISON:  02/21/2018 FINDINGS: Fluoroscopic images show interval right hip arthroplasty. Fluoroscopic time was 10 seconds. Radiation dose 1.38 mGy. IMPRESSION: Fluoroscopic assistance was provided for right hip arthroplasty. Electronically Signed   By: Elmer Picker M.D.   On: 09/27/2021 14:17   DG C-Arm 1-60 Min-No Report  Result Date: 09/27/2021 Fluoroscopy was utilized by the requesting physician.  No radiographic interpretation.    Assessment/Plan: 1 Day Post-Op   Principal Problem:   History of total hip replacement, right   Advance diet Up with therapy Discharge today or tomorrow depending on PT goals  Carlynn Spry , PA-C 09/28/2021, 8:29 AM

## 2021-09-28 NOTE — Anesthesia Postprocedure Evaluation (Signed)
Anesthesia Post Note  Patient: Cameron Foster.  Procedure(s) Performed: TOTAL HIP ARTHROPLASTY ANTERIOR APPROACH (Right: Hip)  Patient location during evaluation: Nursing Unit Anesthesia Type: Spinal Level of consciousness: oriented and awake and alert Pain management: pain level controlled Vital Signs Assessment: post-procedure vital signs reviewed and stable Respiratory status: spontaneous breathing and respiratory function stable Cardiovascular status: blood pressure returned to baseline and stable Postop Assessment: no headache, no backache, no apparent nausea or vomiting and patient able to bend at knees Anesthetic complications: no   No notable events documented.   Last Vitals:  Vitals:   09/27/21 2355 09/28/21 0554  BP: 118/73 123/71  Pulse: 60 (!) 58  Resp: 17 17  Temp: 36.6 C 36.7 C  SpO2: 96% 96%    Last Pain:  Vitals:   09/27/21 2355  TempSrc: Oral  PainSc:                  Norm Salt

## 2021-09-28 NOTE — Progress Notes (Cosign Needed)
Patient is not able to walk the distance required to go the bathroom, or he/she is unable to safely negotiate stairs required to access the bathroom.  A 3in1 BSC will alleviate this problem

## 2021-09-28 NOTE — Progress Notes (Signed)
Physical Therapy Treatment Patient Details Name: Cameron Foster. MRN: [id number] DOB: October 04, 1940 Today's Date: 09/28/2021   History of Present Illness Pt is a 81 yo M s/p R THA 09/27/21. PMH includes HTN, Afib, CAD, CVA, GERD, cancer, HLD    PT Comments    Pt was pleasant and motivated to participate during the session and put forth good effort throughout. Pt able to complete supine to sit with supervision following verbal cues to reach for hand rails. Pt able to complete sit to stand transfers CGA but needs constant cuing for hand placement when initiating stand and for lowering to sit. Pt with some impulsivity when coming to stand and educated on ensuring RW is in front of pt to ensure safety w/ transfers. Pt completed walk 147f around nurses station using RW w/ CGA but with constant cuing to maintain RW close to BOS as well as to decrease gait speed for better stability. Very little carry over despite constant verbal cues for safety. Pt will benefit from PT services in a SNF setting upon discharge to safely address deficits listed in patient problem list for decreased caregiver assistance and eventual return to PLOF.    Recommendations for follow up therapy are one component of a multi-disciplinary discharge planning process, led by the attending physician.  Recommendations may be updated based on patient status, additional functional criteria and insurance authorization.  Follow Up Recommendations  Skilled nursing-short term rehab (<3 hours/day)     Assistance Recommended at Discharge Intermittent Supervision/Assistance  Patient can return home with the following A little help with walking and/or transfers;A little help with bathing/dressing/bathroom;Assistance with cooking/housework;Help with stairs or ramp for entrance   Equipment Recommendations  BSC/3in1    Recommendations for Other Services       Precautions / Restrictions Precautions Precautions: Anterior Hip Precaution  Booklet Issued: Yes (comment) Restrictions Weight Bearing Restrictions: Yes RLE Weight Bearing: Weight bearing as tolerated     Mobility  Bed Mobility Overal bed mobility: Needs Assistance Bed Mobility: Supine to Sit     Supine to sit: Supervision     General bed mobility comments: extra time and effort to complete, verbal cuing to reach for handrail    Transfers Overall transfer level: Needs assistance Equipment used: Rolling walker (2 wheels) Transfers: Sit to/from Stand Sit to Stand: Min guard           General transfer comment: verbal cuing for hand placement for initiating and lowering to sit    Ambulation/Gait Ambulation/Gait assistance: Min guard Gait Distance (Feet): 160 Feet Assistive device: Rolling walker (2 wheels) Gait Pattern/deviations: Step-through pattern, Decreased step length - right, Decreased step length - left Gait velocity: decreased     General Gait Details: constant verbal cuing to maintain RW close and to slow speed for better stability   Stairs             Wheelchair Mobility    Modified Rankin (Stroke Patients Only)       Balance Overall balance assessment: Needs assistance Sitting-balance support: Feet supported, Bilateral upper extremity supported Sitting balance-Leahy Scale: Good     Standing balance support: Bilateral upper extremity supported, During functional activity Standing balance-Leahy Scale: Fair Standing balance comment: min LOB posteriorly when first standing up, able to self correct                            Cognition Arousal/Alertness: Awake/alert Behavior During Therapy: W[facility]for tasks assessed/performed Overall  Cognitive Status: Within Functional Limits for tasks assessed                                          Exercises Total Joint Exercises Ankle Circles/Pumps: Strengthening, Both, 10 reps Quad Sets: Strengthening, Both, 10 reps Gluteal Sets: Strengthening,  Both, 10 reps Long Arc Quad: Strengthening, Both, 10 reps (manual resistance) Knee Flexion: Strengthening, Both, 10 reps (manual resistance) Marching in Standing: Seated, Strengthening, Left, 10 reps (manual resistance) Other Exercises     General Comments General comments (skin integrity, edema, etc.): Some leakage noted at incision site, nursing notified      Pertinent Vitals/Pain Pain Assessment Pain Assessment: 0-10 Pain Score: 2  Pain Location: R hip Pain Descriptors / Indicators: Aching, Discomfort Pain Intervention(s): Monitored during session, Premedicated before session, Repositioned    Home Living Family/patient expects to be discharged to:: Private residence Living Arrangements: Spouse/significant other (wife) Available Help at Discharge: Family;Available 24 hours/day Type of Home: House Home Access: Level entry       Home Layout: One level Home Equipment: Cane - single Barista (2 wheels);Rollator (4 wheels)      Prior Function            PT Goals (current goals can now be found in the care plan section) Acute Rehab PT Goals Patient Stated Goal: pt would like to get back to independent walking and playing golf PT Goal Formulation: With patient Time For Goal Achievement: 10/11/21 Potential to Achieve Goals: Good Progress towards PT goals: Progressing toward goals    Frequency    BID      PT Plan Current plan remains appropriate    Co-evaluation              AM-PAC PT "6 Clicks" Mobility   Outcome Measure  Help needed turning from your back to your side while in a flat bed without using bedrails?: A Little Help needed moving from lying on your back to sitting on the side of a flat bed without using bedrails?: A Little Help needed moving to and from a bed to a chair (including a wheelchair)?: A Little Help needed standing up from a chair using your arms (e.g., wheelchair or bedside chair)?: A Little Help needed to walk in  hospital room?: A Little Help needed climbing 3-5 steps with a railing? : A Little 6 Click Score: 18    End of Session Equipment Utilized During Treatment: Gait belt Activity Tolerance: Patient tolerated treatment well Patient left: in chair;with call bell/phone within reach;with chair alarm set;with nursing/sitter in room Nurse Communication: Mobility status PT Visit Diagnosis: Other abnormalities of gait and mobility (R26.89);Muscle weakness (generalized) (M62.81);Unsteadiness on feet (R26.81);Pain Pain - Right/Left: Right Pain - part of body: Hip     Time: 7628-3151 PT Time Calculation (min) (ACUTE ONLY): 26 min  Charges:  $Therapeutic Exercise: 8-22 mins                     Turner Daniels, SPT  09/28/2021, 1:59 PM

## 2021-09-28 NOTE — Progress Notes (Signed)
Met with the patient and his wife in the room, he is agreeable to go home with Home health, [facility] He has a rolling walker at home He needs a 3 in 1 and would like it to be delivered to the house, I notified [employer], they will deliver to his home I notified [name] at [facility] of the need for [facility]

## 2021-09-29 DIAGNOSIS — Z8249 Family history of ischemic heart disease and other diseases of the circulatory system: Secondary | ICD-10-CM | POA: Diagnosis not present

## 2021-09-29 DIAGNOSIS — Z79899 Other long term (current) drug therapy: Secondary | ICD-10-CM | POA: Diagnosis not present

## 2021-09-29 DIAGNOSIS — Z7401 Bed confinement status: Secondary | ICD-10-CM | POA: Diagnosis not present

## 2021-09-29 DIAGNOSIS — I77810 Thoracic aortic ectasia: Secondary | ICD-10-CM | POA: Diagnosis present

## 2021-09-29 DIAGNOSIS — Z85038 Personal history of other malignant neoplasm of large intestine: Secondary | ICD-10-CM | POA: Diagnosis not present

## 2021-09-29 DIAGNOSIS — I10 Essential (primary) hypertension: Secondary | ICD-10-CM | POA: Diagnosis present

## 2021-09-29 DIAGNOSIS — Z91048 Other nonmedicinal substance allergy status: Secondary | ICD-10-CM | POA: Diagnosis not present

## 2021-09-29 DIAGNOSIS — Z87891 Personal history of nicotine dependence: Secondary | ICD-10-CM | POA: Diagnosis not present

## 2021-09-29 DIAGNOSIS — I700 Atherosclerosis of aorta: Secondary | ICD-10-CM | POA: Diagnosis present

## 2021-09-29 DIAGNOSIS — R5381 Other malaise: Secondary | ICD-10-CM | POA: Diagnosis not present

## 2021-09-29 DIAGNOSIS — Z85828 Personal history of other malignant neoplasm of skin: Secondary | ICD-10-CM | POA: Diagnosis not present

## 2021-09-29 DIAGNOSIS — M1611 Unilateral primary osteoarthritis, right hip: Secondary | ICD-10-CM | POA: Diagnosis present

## 2021-09-29 DIAGNOSIS — E785 Hyperlipidemia, unspecified: Secondary | ICD-10-CM | POA: Diagnosis present

## 2021-09-29 DIAGNOSIS — K219 Gastro-esophageal reflux disease without esophagitis: Secondary | ICD-10-CM | POA: Diagnosis present

## 2021-09-29 DIAGNOSIS — I4891 Unspecified atrial fibrillation: Secondary | ICD-10-CM | POA: Diagnosis present

## 2021-09-29 DIAGNOSIS — Z8673 Personal history of transient ischemic attack (TIA), and cerebral infarction without residual deficits: Secondary | ICD-10-CM | POA: Diagnosis not present

## 2021-09-29 DIAGNOSIS — Z8546 Personal history of malignant neoplasm of prostate: Secondary | ICD-10-CM | POA: Diagnosis not present

## 2021-09-29 DIAGNOSIS — Z7982 Long term (current) use of aspirin: Secondary | ICD-10-CM | POA: Diagnosis not present

## 2021-09-29 DIAGNOSIS — R29898 Other symptoms and signs involving the musculoskeletal system: Secondary | ICD-10-CM | POA: Diagnosis not present

## 2021-09-29 LAB — SURGICAL PATHOLOGY

## 2021-09-29 MED ORDER — DOCUSATE SODIUM 100 MG PO CAPS: 100.0000 mg | ORAL_CAPSULE | Freq: Two times a day (BID) | ORAL | 1 refills | Status: DC

## 2021-09-29 MED ORDER — ASPIRIN 81 MG PO CHEW: 81.0000 mg | CHEWABLE_TABLET | Freq: Two times a day (BID) | ORAL | 0 refills | Status: DC

## 2021-09-29 MED ORDER — OXYCODONE HCL 5 MG PO TABS: 5.0000 mg | ORAL_TABLET | ORAL | 0 refills | Status: DC | PRN

## 2021-09-29 NOTE — Plan of Care (Signed)

## 2021-09-29 NOTE — TOC Progression Note (Signed)
Transition of Care (TOC) - Progression Note    Patient Details  Name: Cameron Foster. MRN: [id number] Date of Birth: 1940/10/25  Transition of Care [facility]) CM/SW [place], RN Phone Number: 09/29/2021, 10:35 AM  Clinical Narrative:    The patient is agreeable to STR SNF, Bedsearch sent, PASSR obtained, FL2 completed, will review bed offers once obtained   Expected Discharge Plan: [place] Barriers to Discharge: Continued Medical Work up  Expected Discharge Plan and Services Expected Discharge Plan: [place] arrangements for the past 2 months: Single Family Home Expected Discharge Date: 09/29/21               DME Arranged: 3-N-1 DME Agency: [facility] Date DME Agency Contacted: 09/29/21 Time DME Agency Contacted: [phone] Representative spoke with at DME Agency: [place]: PT [place]: [place] ([place]) Date [place]: 09/28/21 Time [place]: [phone] Representative spoke with at [place]: [name]   Social Determinants of Health ([place]) Interventions    Readmission Risk Interventions     No data to display

## 2021-09-29 NOTE — Progress Notes (Signed)
Physical Therapy Treatment Patient Details Name: Cameron Foster. MRN: [id number] DOB: May 10, 1940 Today's Date: 09/29/2021   History of Present Illness Pt is a 81 yo M s/p R THA 09/27/21. PMH includes HTN, Afib, CAD, CVA, GERD, cancer, HLD    PT Comments    Pt was pleasant and motivated and intermittently lethargic during the session and put forth good effort throughout. Pt reports increased pain this session compared to last. Reports some wooziness in seating. Pt was able to complete sit to stand to RW CGA and reported no worsening symptoms initially. Pt was able ambulate 61f using RW w/ CGA with progressively worsening gait quality and reporting increased pain and wooziness. Pt seated with BP at 103/58 and in standing at 92/51 after 3 min. Pt very limited today by pain and noted BP decreases. Pt will benefit from PT services in a SNF setting upon discharge to safely address deficits listed in patient problem list for decreased caregiver assistance and eventual return to PLOF.    Recommendations for follow up therapy are one component of a multi-disciplinary discharge planning process, led by the attending physician.  Recommendations may be updated based on patient status, additional functional criteria and insurance authorization.  Follow Up Recommendations  Skilled nursing-short term rehab (<3 hours/day)     Assistance Recommended at Discharge Intermittent Supervision/Assistance  Patient can return home with the following A little help with walking and/or transfers;A little help with bathing/dressing/bathroom;Assistance with cooking/housework;Help with stairs or ramp for entrance   Equipment Recommendations  BSC/3in1    Recommendations for Other Services       Precautions / Restrictions Precautions Precautions: Anterior Hip Precaution Booklet Issued: Yes (comment) Restrictions Weight Bearing Restrictions: Yes RLE Weight Bearing: Weight bearing as tolerated     Mobility  Bed  Mobility Overal bed mobility: Needs Assistance Bed Mobility: Supine to Sit, Sit to Supine       Sit to supine: Min assist   General bed mobility comments: extra time and effort to complete, minA to manage RLE into bed    Transfers Overall transfer level: Needs assistance Equipment used: Rolling walker (2 wheels) Transfers: Sit to/from Stand Sit to Stand: Min guard           General transfer comment: UE support to iniate stand    Ambulation/Gait Ambulation/Gait assistance: Min guard Gait Distance (Feet): 35 Feet Assistive device: Rolling walker (2 wheels) Gait Pattern/deviations: Step-through pattern, Decreased step length - right, Decreased step length - left, Decreased stance time - right Gait velocity: decreased     General Gait Details: limited WBing through RLE due to pain; heavy UE support through [employer]obility    Modified Rankin (Stroke Patients Only)       Balance Overall balance assessment: Needs assistance Sitting-balance support: Feet supported, Bilateral upper extremity supported Sitting balance-Leahy Scale: Good     Standing balance support: Bilateral upper extremity supported, During functional activity Standing balance-Leahy Scale: Fair                              Cognition Arousal/Alertness: Awake/alert Behavior During Therapy: WFL for tasks assessed/performed Overall Cognitive Status: Within Functional Limits for tasks assessed  Exercises General Exercises - Lower Extremity Hip Flexion/Marching: Standing, Strengthening, Both, 10 reps    General Comments        Pertinent Vitals/Pain Pain Assessment Pain Assessment: 0-10 Pain Score: 6  Pain Location: R hip Pain Descriptors / Indicators: Aching, Discomfort, Sharp Pain Intervention(s): Premedicated before session, Repositioned, Monitored during session    Home Living                           Prior Function            PT Goals (current goals can now be found in the care plan section) Progress towards PT goals: Progressing toward goals    Frequency    BID      PT Plan Current plan remains appropriate    Co-evaluation              AM-PAC PT "6 Clicks" Mobility   Outcome Measure  Help needed turning from your back to your side while in a flat bed without using bedrails?: A Little Help needed moving from lying on your back to sitting on the side of a flat bed without using bedrails?: A Little Help needed moving to and from a bed to a chair (including a wheelchair)?: A Little Help needed standing up from a chair using your arms (e.g., wheelchair or bedside chair)?: A Little Help needed to walk in hospital room?: A Little Help needed climbing 3-5 steps with a railing? : A Lot 6 Click Score: 17    End of Session Equipment Utilized During Treatment: Gait belt Activity Tolerance: Patient limited by pain Patient left: in bed;with call bell/phone within reach;with bed alarm set Nurse Communication: Mobility status PT Visit Diagnosis: Other abnormalities of gait and mobility (R26.89);Muscle weakness (generalized) (M62.81);Unsteadiness on feet (R26.81);Pain Pain - Right/Left: Right Pain - part of body: Hip     Time: 0998-3382 PT Time Calculation (min) (ACUTE ONLY): 29 min  Charges:                        Turner Daniels, SPT  09/29/2021, 10:36 AM

## 2021-09-29 NOTE — NC FL2 (Signed)
[place] LEVEL OF CARE SCREENING TOOL     IDENTIFICATION  Patient Name: Cameron Foster. Birthdate: March 20, 1941 Sex: male Admission Date (Current Location): 09/27/2021  [place] and [place] Number:      Facility and Address:  [facility], [address] [place], [place] [address]      Provider Number: [id number]  Attending Physician Name and Address:  Lovell Sheehan, MD  Relative Name and Phone Number:  [place] spouse [phone]    Current Level of Care: Hospital Recommended Level of Care: [place] Prior Approval Number:    Date Approved/Denied:   PASRR Number: [id number]  Discharge Plan: SNF    Current Diagnoses: Patient Active Problem List   Diagnosis Date Noted   History of total hip replacement, right 09/27/2021   Preop cardiovascular exam 07/23/2021   Persistent atrial fibrillation ([place])    Atrial fibrillation with rapid ventricular response (HCC)    Acute clinical systolic heart failure ([place]) 06/26/2021   Left sided colitis ([place]) 06/23/2021   Actinic keratosis 05/24/2021   Squamous cell cancer of skin of left forearm 05/24/2021   Chronic gastric ulcer with hemorrhage 02/25/2021   Irregular Z line of esophagus 02/25/2021   Chronic pansinusitis 01/26/2021   Dizziness 01/26/2021   Chronic cough 01/26/2021   Hospital discharge follow-up 01/26/2021   Melena 01/13/2021   Acute blood loss anemia 01/13/2021   Alcohol use disorder, moderate, dependence ([place]) 01/13/2021   Stage 3a chronic kidney disease ([place]) 11/23/2020   Chronic pain of right ankle 05/21/2020   Encounter for attention to colostomy ([place]) 05/21/2020   Achilles tendinitis 04/30/2020   Synovitis and tenosynovitis 04/30/2020   Lymphedema 03/03/2020   Chronic venous insufficiency 03/03/2020   Fatigue 02/14/2020   Leg edema 02/14/2020   Foraminal stenosis of lumbar region 11/08/2019   Chronic bilateral low back pain without sciatica  10/18/2019   Foraminal stenosis of cervical region 10/18/2019   PSVT (paroxysmal supraventricular tachycardia) ([place]) 08/01/2019   Lacunar infarction ([place]) 07/10/2019   History of stroke 07/01/2019   Cerebrovascular accident (CVA) ([place]) 06/08/2019   Paroxysmal atrial fibrillation ([place]) 11/07/2018   TIA (transient ischemic attack) 10/20/2018   History of subarachnoid hemorrhage 10/16/2018   Low libido 08/30/2018   Obesity 05/14/2018   Hyperlipidemia LDL goal <70 05/14/2018   Degeneration of lumbar intervertebral disc 03/22/2018   Osteoarthritis of hip 03/22/2018   Trochanteric bursitis of right hip 03/22/2018   History of colostomy reversal 03/21/2017   Status post partial colectomy 01/03/2017   Depression, major, single episode, mild ([place]) 01/03/2017   Vasovagal syncope 01/27/2016   History of skin cancer 10/28/2015   Osteoarthritis of right knee 10/01/2015   Essential hypertension 08/25/2015   Dyspnea on exertion 08/12/2015   Erectile dysfunction following radiation therapy 08/04/2015   Constipation 08/02/2015   History of prostate cancer 01/20/2015   Swelling of right lower extremity 01/20/2015    Orientation RESPIRATION BLADDER Height & Weight     Self, Time, Situation, Place  Normal Continent Weight: 92.1 kg Height:  5' 8"  (172.7 cm)  BEHAVIORAL SYMPTOMS/MOOD NEUROLOGICAL BOWEL NUTRITION STATUS      Continent Diet (see dc summary)  AMBULATORY STATUS COMMUNICATION OF NEEDS Skin   Extensive Assist Verbally Normal, Surgical wounds                       Personal Care Assistance Level of Assistance  Bathing, Dressing, Feeding Bathing Assistance: Limited assistance Feeding assistance: Independent Dressing  Assistance: Limited assistance     Functional Limitations Info             SPECIAL CARE FACTORS FREQUENCY  PT (By licensed PT), OT (By licensed OT)     PT Frequency: 5 times per week OT Frequency: 5 times per week            Contractures Contractures  Info: Not present    Additional Factors Info  Code Status, Allergies Code Status Info: full code Allergies Info: Formaldehyde           Current Medications (09/29/2021):  This is the current hospital active medication list Current Facility-Administered Medications  Medication Dose Route Frequency Provider Last Rate Last Admin   acetaminophen (TYLENOL) tablet 325-650 mg  325-650 mg Oral Q6H PRN Lovell Sheehan, MD   650 mg at 09/29/21 0001   alum & mag hydroxide-simeth (MAALOX/MYLANTA) 200-200-20 MG/5ML suspension 30 mL  30 mL Oral Q4H PRN Lovell Sheehan, MD       aspirin chewable tablet 81 mg  81 mg Oral BID Lovell Sheehan, MD   81 mg at 09/29/21 0857   atorvastatin (LIPITOR) tablet 40 mg  40 mg Oral Daily Lovell Sheehan, MD   40 mg at 09/29/21 0858   bisacodyl (DULCOLAX) suppository 10 mg  10 mg Rectal Daily PRN Lovell Sheehan, MD       docusate sodium (COLACE) capsule 100 mg  100 mg Oral BID Lovell Sheehan, MD   100 mg at 09/29/21 0859   fluticasone (FLONASE) 50 MCG/ACT nasal spray 1 spray  1 spray Each Nare Daily PRN Lovell Sheehan, MD       folic acid (FOLVITE) tablet 1 mg  1 mg Oral Daily Lovell Sheehan, MD   1 mg at 09/29/21 0858   HYDROcodone-acetaminophen (NORCO) 7.5-325 MG per tablet 1-2 tablet  1-2 tablet Oral Q4H PRN Lovell Sheehan, MD       HYDROcodone-acetaminophen (NORCO/VICODIN) 5-325 MG per tablet 1-2 tablet  1-2 tablet Oral Q4H PRN Lovell Sheehan, MD   1 tablet at 09/29/21 5621   lactated ringers infusion   Intravenous Continuous Lovell Sheehan, MD 75 mL/hr at 09/27/21 1833 Infusion Verify at 09/27/21 1833   lisinopril (ZESTRIL) tablet 10 mg  10 mg Oral Daily Lovell Sheehan, MD   10 mg at 09/29/21 0858   magnesium hydroxide (MILK OF MAGNESIA) suspension 30 mL  30 mL Oral Daily PRN Lovell Sheehan, MD   30 mL at 09/28/21 0817   meclizine (ANTIVERT) tablet 25 mg  25 mg Oral Daily PRN Lovell Sheehan, MD       menthol-cetylpyridinium (CEPACOL) lozenge 3 mg  1  lozenge Oral PRN Lovell Sheehan, MD       Or   phenol (CHLORASEPTIC) mouth spray 1 spray  1 spray Mouth/Throat PRN Lovell Sheehan, MD       metoCLOPramide (REGLAN) tablet 5-10 mg  5-10 mg Oral Q8H PRN Lovell Sheehan, MD       Or   metoCLOPramide (REGLAN) injection 5-10 mg  5-10 mg Intravenous Q8H PRN Lovell Sheehan, MD       metoprolol tartrate (LOPRESSOR) tablet 100 mg  100 mg Oral BID Lovell Sheehan, MD   100 mg at 09/29/21 0858   morphine (PF) 2 MG/ML injection 0.5-1 mg  0.5-1 mg Intravenous Q2H PRN Lovell Sheehan, MD       ondansetron [facility]) tablet 4 mg  4 mg Oral Q6H PRN Lovell Sheehan, MD       Or   ondansetron [facility]) injection 4 mg  4 mg Intravenous Q6H PRN Lovell Sheehan, MD       pantoprazole (PROTONIX) EC tablet 40 mg  40 mg Oral BID Lovell Sheehan, MD   40 mg at 09/29/21 0859   sertraline (ZOLOFT) tablet 50 mg  50 mg Oral Daily Lovell Sheehan, MD   50 mg at 09/29/21 0857   vitamin B-12 (CYANOCOBALAMIN) tablet 1,000 mcg  1,000 mcg Oral Daily Lovell Sheehan, MD   1,000 mcg at 09/29/21 6815     Discharge Medications: Please see discharge summary for a list of discharge medications.  Relevant Imaging Results:  Relevant Lab Results:   Additional Information [id number]  Conception Oms, RN

## 2021-09-29 NOTE — Progress Notes (Signed)
  Subjective:  Patient reports pain as mild to moderate.    Objective:   VITALS:   Vitals:   09/28/21 1959 09/28/21 2138 09/29/21 0425 09/29/21 0800  BP: 122/82 138/77 123/77 125/80  Pulse: 67 73 (!) 58 (!) 59  Resp: 18  18 18   Temp: 99 F (37.2 C)  98.3 F (36.8 C) (!) 97.5 F (36.4 C)  TempSrc:      SpO2: 99% 96% 97% 96%  Weight:      Height:        PHYSICAL EXAM:  Neurologically intact ABD soft Neurovascular intact Sensation intact distally Intact pulses distally Dorsiflexion/Plantar flexion intact Incision: dressing C/D/I No cellulitis present Compartment soft  LABS  No results found for this or any previous visit (from the past 24 hour(s)).  DG HIP UNILAT WITH PELVIS 1V RIGHT  Result Date: 09/27/2021 CLINICAL DATA:  Fluoroscopic assistance for right hip arthroplasty EXAM: DG HIP (WITH OR WITHOUT PELVIS) 1V RIGHT COMPARISON:  02/21/2018 FINDINGS: Fluoroscopic images show interval right hip arthroplasty. Fluoroscopic time was 10 seconds. Radiation dose 1.38 mGy. IMPRESSION: Fluoroscopic assistance was provided for right hip arthroplasty. Electronically Signed   By: Elmer Picker M.D.   On: 09/27/2021 14:17   DG C-Arm 1-60 Min-No Report  Result Date: 09/27/2021 Fluoroscopy was utilized by the requesting physician.  No radiographic interpretation.    Assessment/Plan: 2 Days Post-Op   Principal Problem:   History of total hip replacement, right   Advance diet Up with therapy D/C home when PT goals met  Carlynn Spry , PA-C 09/29/2021, 9:54 AM

## 2021-09-29 NOTE — Evaluation (Signed)
Occupational Therapy Evaluation Patient Details Name: Cameron Foster. MRN: [id number] DOB: May 17, 1940 Today's Date: 09/29/2021   History of Present Illness Pt is a 81 yo M s/p R THA 09/27/21. PMH includes HTN, Afib, CAD, CVA, GERD, cancer, HLD   Clinical Impression   Pt seen for OT evaluation this date, POD#1 from above surgery. Pt was independent in all ADLs prior to surgery, however occasionally using SPC due to R hip pain. Pt is eager to return to PLOF with less pain and improved safety and independence. Pt requires MinA for bed mobility/transfers and declines OOB mobility at present, secondary to pain. Pt is A & Ox3 but demonstrates slight confusion and seems surprised by intensity of pain he is experiencing. He is also lethargic and appears to fall asleep briefly during session. Provided educ to pt and spouse re: home and routines modifications, bed-level therex, self care skills, falls prevention strategies, DME/AE for LB bathing and dressing tasks, compression stocking mgt strategies, and car transfer techniques. Pt would benefit from additional instruction in self care skills and techniques to support recall and carryover prior to discharge. Recommend DC to SNF to continue rehabilitation process.   Recommendations for follow up therapy are one component of a multi-disciplinary discharge planning process, led by the attending physician.  Recommendations may be updated based on patient status, additional functional criteria and insurance authorization.   Follow Up Recommendations  Skilled nursing-short term rehab (<3 hours/day)    Assistance Recommended at Discharge Frequent or constant Supervision/Assistance  Patient can return home with the following A little help with walking and/or transfers;A lot of help with bathing/dressing/bathroom;Assist for transportation    Functional Status Assessment  Patient has had a recent decline in their functional status and demonstrates the ability to  make significant improvements in function in a reasonable and predictable amount of time.  Equipment Recommendations  BSC/3in1    Recommendations for Other Services       Precautions / Restrictions Precautions Precautions: Anterior Hip Restrictions Weight Bearing Restrictions: Yes RLE Weight Bearing: Weight bearing as tolerated      Mobility Bed Mobility Overal bed mobility: Needs Assistance Bed Mobility: Supine to Sit     Supine to sit: Min assist Sit to supine: Min assist   General bed mobility comments: extra time and effort, Min A for lateral movement of RLE    Transfers Overall transfer level: Needs assistance                 General transfer comment: Pt declines standing or OOB activity, citing pain      Balance Overall balance assessment: Needs assistance Sitting-balance support: Feet supported, Bilateral upper extremity supported Sitting balance-Leahy Scale: Good         Standing balance comment: pt declines standing                           ADL either performed or assessed with clinical judgement   ADL                                               Vision         Perception     Praxis      Pertinent Vitals/Pain Pain Assessment Pain Assessment: 0-10 Pain Score: 8  Pain Location: R hip Pain Descriptors / Indicators: Aching, Discomfort,  Sharp Pain Intervention(s): Limited activity within patient's tolerance, Repositioned     Hand Dominance Right   Extremity/Trunk Assessment Upper Extremity Assessment Upper Extremity Assessment: Overall WFL for tasks assessed   Lower Extremity Assessment Lower Extremity Assessment: Generalized weakness       Communication Communication Communication: No difficulties   Cognition Arousal/Alertness: Awake/alert Behavior During Therapy: WFL for tasks assessed/performed Overall Cognitive Status: Within Functional Limits for tasks assessed                                        General Comments  Pt endorses confusion re: DC recs as well as surprise at level of pain he is experiencing at present    Exercises Other Exercises Other Exercises: Bed level LE therex. Educ re: AE for LB dressing/bathing, home and routines modifications, importance of OOB activity and active engagement in rehab process, DC recs   Shoulder Instructions      Home Living Family/patient expects to be discharged to:: Private residence Living Arrangements: Spouse/significant other Available Help at Discharge: Family;Available 24 hours/day Type of Home: House Home Access: Level entry     Home Layout: One level     Bathroom Shower/Tub: Occupational psychologist: Standard Bathroom Accessibility: No   Home Equipment: Cane - single Barista (2 wheels);Rollator (4 wheels)          Prior Functioning/Environment Prior Level of Function : Independent/Modified Independent             Mobility Comments: independent community ambulate using SPC and no hx of falls in last 6 months ADLs Comments: independent with ADLs and use of meals on wheels        OT Problem List: Decreased strength;Decreased range of motion;Decreased activity tolerance;Impaired balance (sitting and/or standing);Pain      OT Treatment/Interventions: Self-care/ADL training;Therapeutic exercise;Patient/family education;Balance training;Energy conservation;Therapeutic activities;DME and/or AE instruction    OT Goals(Current goals can be found in the care plan section) Acute Rehab OT Goals Patient Stated Goal: to feel better OT Goal Formulation: With patient Time For Goal Achievement: 10/13/21 Potential to Achieve Goals: Good ADL Goals Pt Will Perform Lower Body Bathing: with modified independence;sit to/from stand Pt Will Perform Lower Body Dressing: with modified independence;sitting/lateral leans Pt Will Transfer to Toilet: with modified independence (using  LRAD)  OT Frequency: Min 2X/week    Co-evaluation              AM-PAC OT "6 Clicks" Daily Activity     Outcome Measure Help from another person eating meals?: None Help from another person taking care of personal grooming?: None Help from another person toileting, which includes using toliet, bedpan, or urinal?: A Lot Help from another person bathing (including washing, rinsing, drying)?: A Lot Help from another person to put on and taking off regular upper body clothing?: A Little Help from another person to put on and taking off regular lower body clothing?: A Lot 6 Click Score: 17   End of Session    Activity Tolerance: Patient limited by fatigue Patient left: in bed;with family/visitor present;with call bell/phone within reach;with bed alarm set  OT Visit Diagnosis: Unsteadiness on feet (R26.81);Pain;Muscle weakness (generalized) (M62.81) Pain - Right/Left: Right Pain - part of body: Hip                Time: 1415-1430 OT Time Calculation (min): 15 min Charges:  OT General Charges $OT  Visit: 1 Visit OT Evaluation $OT Eval Low Complexity: 1 Low OT Treatments $Self Care/Home Management : 8-22 mins Josiah Lobo, PhD, MS, OTR/L 09/29/21, 2:41 PM

## 2021-09-29 NOTE — Plan of Care (Signed)
Problem: Education: Goal: Knowledge of General Education information will improve Description: Including pain rating scale, medication(s)/side effects and non-pharmacologic comfort measures 09/29/2021 1855 by Gwendel Hanson, LPN Outcome: Progressing 09/29/2021 1648 by Gwendel Hanson, LPN Outcome: Progressing   Problem: Health Behavior/Discharge Planning: Goal: Ability to manage health-related needs will improve 09/29/2021 1855 by Gwendel Hanson, LPN Outcome: Progressing 09/29/2021 1648 by Gwendel Hanson, LPN Outcome: Progressing   Problem: Clinical Measurements: Goal: Ability to maintain clinical measurements within normal limits will improve 09/29/2021 1855 by Gwendel Hanson, LPN Outcome: Progressing 09/29/2021 1648 by Gwendel Hanson, LPN Outcome: Progressing Goal: Will remain free from infection 09/29/2021 1855 by Gwendel Hanson, LPN Outcome: Progressing 09/29/2021 1648 by Gwendel Hanson, LPN Outcome: Progressing Goal: Diagnostic test results will improve 09/29/2021 1855 by Gwendel Hanson, LPN Outcome: Progressing 09/29/2021 1648 by Gwendel Hanson, LPN Outcome: Progressing Goal: Respiratory complications will improve 09/29/2021 1855 by Gwendel Hanson, LPN Outcome: Progressing 09/29/2021 1648 by Gwendel Hanson, LPN Outcome: Progressing Goal: Cardiovascular complication will be avoided 09/29/2021 1855 by Gwendel Hanson, LPN Outcome: Progressing 09/29/2021 1648 by Gwendel Hanson, LPN Outcome: Progressing   Problem: Activity: Goal: Risk for activity intolerance will decrease 09/29/2021 1855 by Gwendel Hanson, LPN Outcome: Progressing 09/29/2021 1648 by Gwendel Hanson, LPN Outcome: Progressing   Problem: Nutrition: Goal: Adequate nutrition will be maintained 09/29/2021 1855 by Gwendel Hanson, LPN Outcome: Progressing 09/29/2021 1648 by Gwendel Hanson, LPN Outcome: Progressing   Problem: Coping: Goal: Level of anxiety will decrease 09/29/2021 1855 by Gwendel Hanson, LPN Outcome:  Progressing 09/29/2021 1648 by Gwendel Hanson, LPN Outcome: Progressing   Problem: Elimination: Goal: Will not experience complications related to bowel motility 09/29/2021 1855 by Gwendel Hanson, LPN Outcome: Progressing 09/29/2021 1648 by Gwendel Hanson, LPN Outcome: Progressing Goal: Will not experience complications related to urinary retention 09/29/2021 1855 by Gwendel Hanson, LPN Outcome: Progressing 09/29/2021 1648 by Gwendel Hanson, LPN Outcome: Progressing   Problem: Pain Managment: Goal: General experience of comfort will improve 09/29/2021 1855 by Gwendel Hanson, LPN Outcome: Progressing 09/29/2021 1648 by Gwendel Hanson, LPN Outcome: Progressing   Problem: Safety: Goal: Ability to remain free from injury will improve 09/29/2021 1855 by Gwendel Hanson, LPN Outcome: Progressing 09/29/2021 1648 by Gwendel Hanson, LPN Outcome: Progressing   Problem: Skin Integrity: Goal: Risk for impaired skin integrity will decrease 09/29/2021 1855 by Gwendel Hanson, LPN Outcome: Progressing 09/29/2021 1648 by Gwendel Hanson, LPN Outcome: Progressing   Problem: Education: Goal: Knowledge of the prescribed therapeutic regimen will improve 09/29/2021 1855 by Gwendel Hanson, LPN Outcome: Progressing 09/29/2021 1648 by Gwendel Hanson, LPN Outcome: Progressing Goal: Understanding of discharge needs will improve 09/29/2021 1855 by Gwendel Hanson, LPN Outcome: Progressing 09/29/2021 1648 by Gwendel Hanson, LPN Outcome: Progressing Goal: Individualized Educational Video(s) 09/29/2021 1855 by Gwendel Hanson, LPN Outcome: Progressing 09/29/2021 1648 by Gwendel Hanson, LPN Outcome: Progressing   Problem: Activity: Goal: Ability to avoid complications of mobility impairment will improve 09/29/2021 1855 by Gwendel Hanson, LPN Outcome: Progressing 09/29/2021 1648 by Gwendel Hanson, LPN Outcome: Progressing Goal: Ability to tolerate increased activity will improve 09/29/2021 1855 by Gwendel Hanson, LPN Outcome: Progressing 09/29/2021 1648 by Gwendel Hanson, LPN Outcome: Progressing   Problem: Clinical Measurements: Goal: Postoperative complications will be avoided or minimized 09/29/2021 1855 by Gwendel Hanson, LPN Outcome: Progressing 09/29/2021 1648 by Sallee Provencal  C, LPN Outcome: Progressing   Problem: Pain Management: Goal: Pain level will decrease with appropriate interventions 09/29/2021 1855 by Gwendel Hanson, LPN Outcome: Progressing 09/29/2021 1648 by Gwendel Hanson, LPN Outcome: Progressing   Problem: Skin Integrity: Goal: Will show signs of wound healing 09/29/2021 1855 by Gwendel Hanson, LPN Outcome: Progressing 09/29/2021 1648 by Gwendel Hanson, LPN Outcome: Progressing

## 2021-09-29 NOTE — Discharge Instructions (Signed)
No tight elastic waist bands over the bandage. Not wearing underwear is preferred, or pull waist band above the bandage.  May shower with bandage in place.  If bandage becomes saturated, OK to removal and place band-aid.  You may be up walking around as tolerated but should take periodic breaks to elevate your legs.    Pain medication can cause constipation.  You should increase your fluid intake, increase your intake of high fiber foods and/or take Metamucil as needed for constipation.  You may shower.  You do NOT need to cover the dressing or incision site with plastic wrap.  The dressing or incision can get wet, but do not submerge under water.  After your staples have been removed, you should wait 48 hours before submerging incision under water.  Continue your physical therapy exercises at least twice daily.  It is a good idea to use an ice pack for 30 minutes after doing your exercises to reduce swelling.  Do not be surprised if you have increased pain at night.  This usually means you have been a little too active during the day and need to reduce your activities.  If you develop lower extremity swelling that does not improve after a night of elevation, please call the office.  This could be an early sign of a blood clot.  Call with questions, fever>101.5 degrees, shortness of breath or drainage from the wound  [phone]

## 2021-09-29 NOTE — Progress Notes (Signed)
Physical Therapy Treatment Patient Details Name: Cameron Foster. MRN: [id number] DOB: 1940-06-14 Today's Date: 09/29/2021   History of Present Illness Pt is a 81 yo M s/p R THA 09/27/21. PMH includes HTN, Afib, CAD, CVA, GERD, cancer, HLD    PT Comments    Pt was alert and oriented and agreeable to PT but still intermittently lethargic throughout session. Pt able to complete supine to sit w/ minA with extra time and effort to complete and assist for RLE management. Pt completed sit to stand CGA but with verbal cuing for hand placement. Pt ambulated in room 109f using RW with CGA and required constant verbal cuing to maintain walker close. Pt cued for sequencing to help reduce pain to RLE with little carry over. BP at start of session in sitting 105/80; and after walking 100/63 w/ no other adverse symptom response other than dizziness. Pt still very limited by pain. Pt will benefit from PT services in a SNF setting upon discharge to safely address deficits listed in patient problem list for decreased caregiver assistance and eventual return to PLOF.    Recommendations for follow up therapy are one component of a multi-disciplinary discharge planning process, led by the attending physician.  Recommendations may be updated based on patient status, additional functional criteria and insurance authorization.  Follow Up Recommendations  Skilled nursing-short term rehab (<3 hours/day)     Assistance Recommended at Discharge Intermittent Supervision/Assistance  Patient can return home with the following A little help with walking and/or transfers;A little help with bathing/dressing/bathroom;Assistance with cooking/housework;Help with stairs or ramp for entrance   Equipment Recommendations  BSC/3in1    Recommendations for Other Services       Precautions / Restrictions Precautions Precautions: Anterior Hip Precaution Booklet Issued: Yes (comment) Restrictions Weight Bearing Restrictions:  Yes RLE Weight Bearing: Weight bearing as tolerated     Mobility  Bed Mobility Overal bed mobility: Needs Assistance Bed Mobility: Supine to Sit     Supine to sit: Min assist Sit to supine: Min assist   General bed mobility comments: extra time and effort, Min A for lateral movement of RLE    Transfers Overall transfer level: Needs assistance Equipment used: Rolling walker (2 wheels) Transfers: Sit to/from Stand Sit to Stand: Min guard           General transfer comment: extra time and effort to complete, verbal cues for hand placement    Ambulation/Gait Ambulation/Gait assistance: Min guard Gait Distance (Feet): 35 Feet Assistive device: Rolling walker (2 wheels) Gait Pattern/deviations: Step-through pattern, Decreased step length - right, Decreased step length - left, Decreased stance time - right Gait velocity: decreased     General Gait Details: limited WBing through RLE due to pain; heavy UE support through [employer]    Modified Rankin (Stroke Patients Only)       Balance Overall balance assessment: Needs assistance Sitting-balance support: Feet supported, Bilateral upper extremity supported Sitting balance-Leahy Scale: Good     Standing balance support: During functional activity, Bilateral upper extremity supported Standing balance-Leahy Scale: Fair                              Cognition Arousal/Alertness: Awake/alert Behavior During Therapy: WFL for tasks assessed/performed Overall Cognitive Status: Within Functional Limits for tasks assessed  Exercises General Exercises - Lower Extremity Ankle Circles/Pumps: Strengthening, Both, 10 reps Quad Sets: Strengthening, Both, 10 reps Long Arc Quad: Strengthening, Both, 10 reps Heel Slides: Strengthening, Both, 10 reps Hip Flexion/Marching: Standing, Strengthening, Both, 10 reps     General Comments General comments (skin integrity, edema, etc.): Pt endorses confusion re: DC recs as well as surprise at level of pain he is experiencing at present      Pertinent Vitals/Pain Pain Assessment Pain Assessment: 0-10 Pain Score: 4  Pain Location: R hip Pain Descriptors / Indicators: Aching, Discomfort, Sharp Pain Intervention(s): Monitored during session, Premedicated before session, Repositioned    Home Living Family/patient expects to be discharged to:: Private residence Living Arrangements: Spouse/significant other Available Help at Discharge: Family;Available 24 hours/day Type of Home: House Home Access: Level entry       Home Layout: One level Home Equipment: Cane - single Barista (2 wheels);Rollator (4 wheels)      Prior Function            PT Goals (current goals can now be found in the care plan section) Progress towards PT goals: Progressing toward goals    Frequency    BID      PT Plan Current plan remains appropriate    Co-evaluation              AM-PAC PT "6 Clicks" Mobility   Outcome Measure  Help needed turning from your back to your side while in a flat bed without using bedrails?: A Little Help needed moving from lying on your back to sitting on the side of a flat bed without using bedrails?: A Little Help needed moving to and from a bed to a chair (including a wheelchair)?: A Little Help needed standing up from a chair using your arms (e.g., wheelchair or bedside chair)?: A Little Help needed to walk in hospital room?: A Little Help needed climbing 3-5 steps with a railing? : A Lot 6 Click Score: 17    End of Session Equipment Utilized During Treatment: Gait belt Activity Tolerance: Patient limited by pain Patient left: with call bell/phone within reach;in chair;with chair alarm set Nurse Communication: Mobility status PT Visit Diagnosis: Other abnormalities of gait and mobility (R26.89);Muscle weakness  (generalized) (M62.81);Unsteadiness on feet (R26.81);Pain Pain - Right/Left: Right Pain - part of body: Hip     Time: 3474-2595 PT Time Calculation (min) (ACUTE ONLY): 25 min  Charges:                        Turner Daniels, SPT  09/29/2021, 3:05 PM

## 2021-09-29 NOTE — Discharge Summary (Signed)
Physician Discharge Summary  Patient ID: Cameron Foster. MRN: [id number] DOB/AGE: 1940/07/03 81 y.o.  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 b.)  CT CAP 06/22/2021: Ao root 41 mm. c.) TTE 06/26/2021: Ao root 41 mm; asc Ao 39 mm   Atrial fibrillation (HCC) 1995   a.) single episode in 1995 per patient; no long term treatment. b.) recurrent episode in the setting of GI bleeding related to colitis 06/2021.   Basal cell carcinoma 04/26/2017   Right medial cheek. Superficial and nodular   Basal cell carcinoma 06/11/2019   Left anterior shoulder. Nodular pattern   Basal cell carcinoma 09/09/2019   Right nasal ala, EDC   Basal cell carcinoma 02/05/2020   L upper eyebrow, EDC    Bilateral carotid artery stenosis 06/09/2019   a.) Carotid doppler: mod; <50% BILATERAL ICAs.   Chicken pox    Colon cancer (HCC)    Colon polyp    Depression    Son died 04-Jul-2014   Diverticulitis    Diverticulosis 30 years   Gastritis    GERD (gastroesophageal reflux disease)    H. pylori infection    History of stress test    a. 09/2015 MV: No ischemia/infarct. EF 45-54% (nl by echo).   Hyperlipidemia    Hypertension    Irritable bowel syndrome    Lacunar infarction ([place]) 06/08/2019   a.) small; RIGHT motor strip   NSVT (nonsustained ventricular tachycardia) (HCC)    a.)  Single episode lasting 5 beats at a maximum rate of 160 bpm noted on Holter study performed 08/13/2021.   Prostate cancer [facility]) Jul 04, 2010   a.) s/p XRT   PSVT (paroxysmal  supraventricular tachycardia) ([place])    a. 06/2019 Zio: Avg rate 74 (54-120), occas PACs, rare PVCs, 125 episodes of PVCs (longest 17.5 secs; max rate 187). No afib.   RBBB    SAH (subarachnoid hemorrhage) ([place]) 10/10/2018   Squamous cell carcinoma of skin 07/18/2017   Left medial calf. KA type   Squamous cell carcinoma of skin 04/24/2018   Right above med. brow   Squamous cell carcinoma of skin 06/11/2019   Right posterior shoulder. SCCis, hypertrophic   Squamous cell carcinoma of skin 01/09/2020   Mid nasal dorsum, MOHS, Efudex x 4wks   Systolic dysfunction    a.) TTE 10/05/2015: EF 50-55%; mild LVH; LAE; triv AR, mild MR. b.) TTE 03/29/2018: EF 55-60%; LAE, mild AR; ? small PFO. c.) TTE 06/09/2019: EF 55-60%, no rwma, triv MR/AI. d.) TTE 06/26/2021: EF 45-50%; glob HK; LAE; mild MR; Ao root 41 mm; asc Ao 39 mm.   Vasovagal syncope     Surgeries: Procedure(s): TOTAL HIP ARTHROPLASTY ANTERIOR APPROACH on 09/27/2021   Consultants (if any):   Discharged Condition: Improved  Hospital Course: Cameron Foster. is an 80 y.o. 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    He was  given perioperative antibiotics:  Anti-infectives (From admission, onward)    Start     Dose/Rate Route Frequency Ordered Stop   09/27/21 1815  ceFAZolin (ANCEF) IVPB 2g/100 mL premix        2 g 200 mL/hr over 30 Minutes Intravenous Every 6 hours 09/27/21 1718 09/28/21 0024   09/27/21 1004  ceFAZolin (ANCEF) 2-4 GM/100ML-% IVPB       Note to Pharmacy: Norton Blizzard A: cabinet override      09/27/21 1004 09/27/21 1303   09/27/21 0600  ceFAZolin (ANCEF) IVPB 2g/100 mL premix        2 g 200 mL/hr over 30 Minutes Intravenous On call to O.R. 09/27/21 0157 09/27/21 1257     .  He was given sequential compression devices,  early ambulation, and aspirin 81 mg twice daily for 30 days for DVT prophylaxis.  He benefited maximally from the hospital stay and there were no complications.    Recent vital signs:  Vitals:   09/29/21 0425 09/29/21 0800  BP: 123/77 125/80  Pulse: (!) 58 (!) 59  Resp: 18 18  Temp: 98.3 F (36.8 C) (!) 97.5 F (36.4 C)  SpO2: 97% 96%    Recent laboratory studies:  Lab Results  Component Value Date   HGB 11.0 (L) 08/24/2021   HGB 10.7 (L) 07/15/2021   HGB 10.5 (L) 07/03/2021   Lab Results  Component Value Date   WBC 7.5 08/24/2021   PLT 250 08/24/2021   Lab Results  Component Value Date   INR 1.0 06/08/2019   Lab Results  Component Value Date   NA 140 08/24/2021   K 4.6 08/24/2021   CL 103 08/24/2021   CO2 27 08/24/2021   BUN 14 08/24/2021   CREATININE 1.37 (H) 08/24/2021   GLUCOSE 103 (H) 08/24/2021    Discharge Medications:   Allergies as of 09/29/2021       Reactions   Formaldehyde Rash   From shoes made with this material        Medication List     STOP taking these medications    aspirin EC 81 MG tablet Replaced by: aspirin 81 MG chewable tablet       TAKE these medications    aspirin 81 MG chewable tablet Chew 1 tablet (81 mg total) by mouth 2 (two) times daily. Replaces: aspirin EC 81 MG tablet   atorvastatin 40 MG tablet Commonly known as: LIPITOR TAKE 1 TABLET BY MOUTH EVERY DAY   docusate sodium 100 MG capsule Commonly known as: COLACE Take 1 capsule (100 mg total) by mouth 2 (two) times daily. What changed: when to take this   fluticasone 50 MCG/ACT nasal spray Commonly known as: FLONASE PLACE 1 SPRAY INTO BOTH NOSTRILS DAILY AS NEEDED FOR ALLERGIES OR RHINITIS.   folic acid 1 MG tablet Commonly known as: FOLVITE Take 1 tablet (1 mg total) by mouth daily.   hydrocortisone 2.5 % cream APPLY TO AFFECTED AREA TWICE A DAY AS NEEDED FOR RASH   lisinopril 10 MG tablet Commonly known as: ZESTRIL Take 10 mg by mouth  daily.   meclizine 25 MG tablet Commonly known as: ANTIVERT Take 25 mg by mouth daily as needed (Vertigo).   metoprolol tartrate 100 MG tablet Commonly known as: LOPRESSOR Take 1 tablet (100 mg total) by mouth 2 (two) times daily.   Mucinex Maximum Strength 1200 MG Tb12 Generic drug: Guaifenesin Take 1 tablet by mouth daily as needed.   multivitamin with minerals Tabs tablet  Take 1 tablet by mouth daily.   oxyCODONE 5 MG immediate release tablet Commonly known as: Roxicodone Take 1 tablet (5 mg total) by mouth every 4 (four) hours as needed for severe pain.   pantoprazole 40 MG tablet Commonly known as: PROTONIX Take 40 mg by mouth 2 (two) times daily.   sertraline 50 MG tablet Commonly known as: ZOLOFT Take 1 tablet (50 mg total) by mouth daily.   vitamin B-12 1000 MCG tablet Commonly known as: CYANOCOBALAMIN Take 1,000 mcg by mouth daily.               Durable Medical Equipment  (From admission, onward)           Start     Ordered   09/27/21 1719  DME Walker rolling  Once       Question:  Patient needs a walker to treat with the following condition  Answer:  History of total hip replacement, right   09/27/21 1718   09/27/21 1719  DME 3 n 1  Once        09/27/21 1718   09/27/21 1719  DME Bedside commode  Once       Question:  Patient needs a bedside commode to treat with the following condition  Answer:  History of total hip replacement, right   09/27/21 1718   Unscheduled  For home use only DME 3 n 1  Once        09/29/21 0958   Unscheduled  For home use only DME Walker rolling  Once       Question Answer Comment  Walker: With 5 Inch Wheels   Patient needs a walker to treat with the following condition S/P total right hip arthroplasty      09/29/21 0958            Diagnostic Studies: DG HIP UNILAT WITH PELVIS 1V RIGHT  Result Date: 09/27/2021 CLINICAL DATA:  Fluoroscopic assistance for right hip arthroplasty EXAM: DG HIP (WITH OR WITHOUT  PELVIS) 1V RIGHT COMPARISON:  02/21/2018 FINDINGS: Fluoroscopic images show interval right hip arthroplasty. Fluoroscopic time was 10 seconds. Radiation dose 1.38 mGy. IMPRESSION: Fluoroscopic assistance was provided for right hip arthroplasty. Electronically Signed   By: Elmer Picker M.D.   On: 09/27/2021 14:17   DG C-Arm 1-60 Min-No Report  Result Date: 09/27/2021 Fluoroscopy was utilized by the requesting physician.  No radiographic interpretation.    Disposition: Discharge disposition: 01-Home or Self Care            Signed: Carlynn Spry ,PA-C 09/29/2021, 9:58 AM

## 2021-09-30 DIAGNOSIS — K589 Irritable bowel syndrome without diarrhea: Secondary | ICD-10-CM | POA: Insufficient documentation

## 2021-09-30 DIAGNOSIS — I5020 Unspecified systolic (congestive) heart failure: Secondary | ICD-10-CM | POA: Insufficient documentation

## 2021-09-30 DIAGNOSIS — C61 Malignant neoplasm of prostate: Secondary | ICD-10-CM | POA: Insufficient documentation

## 2021-09-30 DIAGNOSIS — F1020 Alcohol dependence, uncomplicated: Secondary | ICD-10-CM | POA: Insufficient documentation

## 2021-09-30 DIAGNOSIS — I110 Hypertensive heart disease with heart failure: Secondary | ICD-10-CM | POA: Insufficient documentation

## 2021-09-30 DIAGNOSIS — J309 Allergic rhinitis, unspecified: Secondary | ICD-10-CM | POA: Insufficient documentation

## 2021-09-30 DIAGNOSIS — E539 Vitamin B deficiency, unspecified: Secondary | ICD-10-CM | POA: Insufficient documentation

## 2021-09-30 DIAGNOSIS — C4490 Unspecified malignant neoplasm of skin, unspecified: Secondary | ICD-10-CM | POA: Insufficient documentation

## 2021-09-30 DIAGNOSIS — Z7901 Long term (current) use of anticoagulants: Secondary | ICD-10-CM | POA: Insufficient documentation

## 2021-09-30 DIAGNOSIS — K5790 Diverticulosis of intestine, part unspecified, without perforation or abscess without bleeding: Secondary | ICD-10-CM | POA: Insufficient documentation

## 2021-09-30 DIAGNOSIS — Z96641 Presence of right artificial hip joint: Secondary | ICD-10-CM | POA: Insufficient documentation

## 2021-09-30 DIAGNOSIS — I480 Paroxysmal atrial fibrillation: Secondary | ICD-10-CM | POA: Diagnosis present

## 2021-09-30 DIAGNOSIS — I2510 Atherosclerotic heart disease of native coronary artery without angina pectoris: Secondary | ICD-10-CM | POA: Insufficient documentation

## 2021-09-30 DIAGNOSIS — K219 Gastro-esophageal reflux disease without esophagitis: Secondary | ICD-10-CM | POA: Insufficient documentation

## 2021-09-30 MED ORDER — ASPIRIN 81 MG PO CHEW: 81.0000 mg | CHEWABLE_TABLET | Freq: Two times a day (BID) | ORAL | 0 refills | Status: DC

## 2021-09-30 MED ORDER — DOCUSATE SODIUM 100 MG PO CAPS: 100.0000 mg | ORAL_CAPSULE | Freq: Two times a day (BID) | ORAL | 1 refills | Status: AC

## 2021-09-30 MED ORDER — TRAMADOL HCL 50 MG PO TABS: 50.0000 mg | ORAL_TABLET | Freq: Four times a day (QID) | ORAL | 0 refills | Status: DC | PRN

## 2021-09-30 MED ORDER — OXYCODONE HCL 5 MG PO TABS: 5.0000 mg | ORAL_TABLET | ORAL | 0 refills | Status: DC | PRN

## 2021-09-30 MED ORDER — TRAMADOL HCL 50 MG PO TABS: 50.0000 mg | ORAL_TABLET | Freq: Four times a day (QID) | ORAL | Status: DC | PRN

## 2021-09-30 NOTE — TOC Progression Note (Signed)
Transition of Care (TOC) - Progression Note    Patient Details  Name: Gates Jividen. MRN: [id number] Date of Birth: 09-Dec-1940  Transition of Care [facility]) CM/SW [place], RN Phone Number: 09/30/2021, 10:55 AM  Clinical Narrative:    Patient accepted the bed offer from [facility], I notified Magda Paganini, I emailed the 3 day waiver to [facility] post acute   Expected Discharge Plan: [place] Barriers to Discharge: Continued Medical Work up  Expected Discharge Plan and Services Expected Discharge Plan: [place] arrangements for the past 2 months: Single Family Home Expected Discharge Date: 09/30/21               DME Arranged: 3-N-1 DME Agency: [employer] Date DME Agency Contacted: 09/29/21 Time DME Agency Contacted: [phone] Representative spoke with at DME Agency: [place]: PT [place]: [place] ([place]) Date [place]: 09/28/21 Time [place]: 1452 Representative spoke with at [place]: [name]   Social Determinants of Health ([place]) Interventions    Readmission Risk Interventions     No data to display

## 2021-09-30 NOTE — Progress Notes (Addendum)
Physical Therapy Treatment Patient Details Name: Cameron Foster. MRN: [id number] DOB: November 07, 1940 Today's Date: 09/30/2021   History of Present Illness Pt is a 81 yo M s/p R THA 09/27/21. PMH includes HTN, Afib, CAD, CVA, GERD, cancer, HLD    PT Comments    Pt was pleasant and motivated to participate during the session and put forth good effort throughout. Pt reports improved pain and dizziness this session. Pt able to complete sit to stand w/ CGA and verbal cuing for RLE placement to help with pain management. Pt was able to ambulate 164f using RW w/ CGA  and w/c follow and required frequent seated rest breaks every 30-436f Constant verbal cuing needed to maintain RW close and decreased foot clearance as pt fatigued. Pt will benefit from PT services in a SNF setting upon discharge to safely address deficits listed in patient problem list for decreased caregiver assistance and eventual return to PLOF.    Recommendations for follow up therapy are one component of a multi-disciplinary discharge planning process, led by the attending physician.  Recommendations may be updated based on patient status, additional functional criteria and insurance authorization.  Follow Up Recommendations  Skilled nursing-short term rehab (<3 hours/day)     Assistance Recommended at Discharge Intermittent Supervision/Assistance  Patient can return home with the following A little help with walking and/or transfers;A little help with bathing/dressing/bathroom;Assistance with cooking/housework;Help with stairs or ramp for entrance   Equipment Recommendations  BSC/3in1    Recommendations for Other Services       Precautions / Restrictions Precautions Precautions: Anterior Hip Precaution Booklet Issued: Yes (comment) Restrictions Weight Bearing Restrictions: Yes RLE Weight Bearing: Weight bearing as tolerated     Mobility  Bed Mobility               General bed mobility comments: Pt in  recliner at start/end of session    Transfers Overall transfer level: Needs assistance Equipment used: Rolling walker (2 wheels) Transfers: Sit to/from Stand Sit to Stand: Min guard           General transfer comment: verbal cue for RLE placement to help with pain management    Ambulation/Gait Ambulation/Gait assistance: Min guard Gait Distance (Feet): 160 Feet Assistive device: Rolling walker (2 wheels) Gait Pattern/deviations: Step-through pattern, Decreased step length - right, Decreased step length - left, Decreased stance time - right Gait velocity: decreased     General Gait Details: heavy UE support through RW; verbal cues to maintain RW close   Stairs             Wheelchair Mobility    Modified Rankin (Stroke Patients Only)       Balance Overall balance assessment: Needs assistance Sitting-balance support: Feet supported Sitting balance-Leahy Scale: Good     Standing balance support: During functional activity, Bilateral upper extremity supported Standing balance-Leahy Scale: Fair                              Cognition Arousal/Alertness: Awake/alert Behavior During Therapy: WFL for tasks assessed/performed Overall Cognitive Status: Within Functional Limits for tasks assessed                                          Exercises      General Comments        Pertinent Vitals/Pain Pain Assessment  Pain Assessment: 0-10 Pain Score: 2  Pain Location: R hip Pain Descriptors / Indicators: Aching, Discomfort, Sharp Pain Intervention(s): Monitored during session, Premedicated before session, Repositioned    Home Living                          Prior Function            PT Goals (current goals can now be found in the care plan section) Progress towards PT goals: Progressing toward goals    Frequency    BID      PT Plan Current plan remains appropriate    Co-evaluation               AM-PAC PT "6 Clicks" Mobility   Outcome Measure  Help needed turning from your back to your side while in a flat bed without using bedrails?: A Little Help needed moving from lying on your back to sitting on the side of a flat bed without using bedrails?: A Little Help needed moving to and from a bed to a chair (including a wheelchair)?: A Little Help needed standing up from a chair using your arms (e.g., wheelchair or bedside chair)?: None Help needed to walk in hospital room?: A Little Help needed climbing 3-5 steps with a railing? : A Little 6 Click Score: 19    End of Session Equipment Utilized During Treatment: Gait belt Activity Tolerance: Patient tolerated treatment well Patient left: with call bell/phone within reach;in chair;with chair alarm set;with nursing/sitter in room Nurse Communication: Mobility status PT Visit Diagnosis: Other abnormalities of gait and mobility (R26.89);Muscle weakness (generalized) (M62.81);Unsteadiness on feet (R26.81);Pain Pain - Right/Left: Right Pain - part of body: Hip     Time: 0912-0933 PT Time Calculation (min) (ACUTE ONLY): 21 min  Charges:                       Turner Daniels, SPT  09/30/2021, 11:02 AM

## 2021-09-30 NOTE — Discharge Summary (Signed)
Physician Discharge Summary  Patient ID: Cameron Foster. MRN: [id number] DOB/AGE: Aug 27, 1940 81 y.o.  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 b.)  CT CAP 06/22/2021: Ao root 41 mm. c.) TTE 06/26/2021: Ao root 41 mm; asc Ao 39 mm   Atrial fibrillation (HCC) 1995   a.) single episode in 1995 per patient; no long term treatment. b.) recurrent episode in the setting of GI bleeding related to colitis 06/2021.   Basal cell carcinoma 04/26/2017   Right medial cheek. Superficial and nodular   Basal cell carcinoma 06/11/2019   Left anterior shoulder. Nodular pattern   Basal cell carcinoma 09/09/2019   Right nasal ala, EDC   Basal cell carcinoma 02/05/2020   L upper eyebrow, EDC    Bilateral carotid artery stenosis 06/09/2019   a.) Carotid doppler: mod; <50% BILATERAL ICAs.   Chicken pox    Colon cancer (HCC)    Colon polyp    Depression    Son died June 24, 2014   Diverticulitis    Diverticulosis 30 years   Gastritis    GERD (gastroesophageal reflux disease)    H. pylori infection    History of stress test    a. 09/2015 MV: No ischemia/infarct. EF 45-54% (nl by echo).   Hyperlipidemia    Hypertension    Irritable bowel syndrome    Lacunar infarction ([place]) 06/08/2019   a.) small; RIGHT motor strip   NSVT (nonsustained ventricular tachycardia) (HCC)    a.)  Single episode lasting 5 beats at a maximum rate of 160 bpm noted on Holter study performed 08/13/2021.   Prostate cancer [facility]) 2010/06/24   a.) s/p XRT   PSVT (paroxysmal  supraventricular tachycardia) ([place])    a. 06/2019 Zio: Avg rate 74 (54-120), occas PACs, rare PVCs, 125 episodes of PVCs (longest 17.5 secs; max rate 187). No afib.   RBBB    SAH (subarachnoid hemorrhage) ([place]) 10/10/2018   Squamous cell carcinoma of skin 07/18/2017   Left medial calf. KA type   Squamous cell carcinoma of skin 04/24/2018   Right above med. brow   Squamous cell carcinoma of skin 06/11/2019   Right posterior shoulder. SCCis, hypertrophic   Squamous cell carcinoma of skin 01/09/2020   Mid nasal dorsum, MOHS, Efudex x 4wks   Systolic dysfunction    a.) TTE 10/05/2015: EF 50-55%; mild LVH; LAE; triv AR, mild MR. b.) TTE 03/29/2018: EF 55-60%; LAE, mild AR; ? small PFO. c.) TTE 06/09/2019: EF 55-60%, no rwma, triv MR/AI. d.) TTE 06/26/2021: EF 45-50%; glob HK; LAE; mild MR; Ao root 41 mm; asc Ao 39 mm.   Vasovagal syncope     Surgeries: Procedure(s): TOTAL HIP ARTHROPLASTY ANTERIOR APPROACH on 09/27/2021   Consultants (if any):   Discharged Condition: Improved  Hospital Course: Dartanian Knaggs. is an 81 y.o. 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    He was  given perioperative antibiotics:  Anti-infectives (From admission, onward)    Start     Dose/Rate Route Frequency Ordered Stop   09/27/21 1815  ceFAZolin (ANCEF) IVPB 2g/100 mL premix        2 g 200 mL/hr over 30 Minutes Intravenous Every 6 hours 09/27/21 1718 09/28/21 0024   09/27/21 1004  ceFAZolin (ANCEF) 2-4 GM/100ML-% IVPB       Note to Pharmacy: Norton Blizzard A: cabinet override      09/27/21 1004 09/27/21 1303   09/27/21 0600  ceFAZolin (ANCEF) IVPB 2g/100 mL premix        2 g 200 mL/hr over 30 Minutes Intravenous On call to O.R. 09/27/21 0157 09/27/21 1257     .  He was given sequential compression devices,  early ambulation, and aspirin 81 mg twice daily for 30 days for DVT prophylaxis.  He benefited maximally from the hospital stay and there were no complications.    Recent vital signs:  Vitals:   09/29/21 2151 09/30/21 0526  BP: 135/79 (!) 145/86  Pulse: 68 67  Resp:  20  Temp:  98.3 F (36.8 C)  SpO2:  95%    Recent laboratory studies:  Lab Results  Component Value Date   HGB 11.0 (L) 08/24/2021   HGB 10.7 (L) 07/15/2021   HGB 10.5 (L) 07/03/2021   Lab Results  Component Value Date   WBC 7.5 08/24/2021   PLT 250 08/24/2021   Lab Results  Component Value Date   INR 1.0 06/08/2019   Lab Results  Component Value Date   NA 140 08/24/2021   K 4.6 08/24/2021   CL 103 08/24/2021   CO2 27 08/24/2021   BUN 14 08/24/2021   CREATININE 1.37 (H) 08/24/2021   GLUCOSE 103 (H) 08/24/2021    Discharge Medications:   Allergies as of 09/30/2021       Reactions   Formaldehyde Rash   From shoes made with this material        Medication List     STOP taking these medications    aspirin EC 81 MG tablet Replaced by: aspirin 81 MG chewable tablet       TAKE these medications    aspirin 81 MG chewable tablet Chew 1 tablet (81 mg total) by mouth 2 (two) times daily. Replaces: aspirin EC 81 MG tablet   atorvastatin 40 MG tablet Commonly known as: LIPITOR TAKE 1 TABLET BY MOUTH EVERY DAY   docusate sodium 100 MG capsule Commonly known as: COLACE Take 1 capsule (100 mg total) by mouth 2 (two) times daily. What changed: when to take this   fluticasone 50 MCG/ACT nasal spray Commonly known as: FLONASE PLACE 1 SPRAY INTO BOTH NOSTRILS DAILY AS NEEDED FOR ALLERGIES OR RHINITIS.   folic acid 1 MG tablet Commonly known as: FOLVITE Take 1 tablet (1 mg total) by mouth daily.   hydrocortisone 2.5 % cream APPLY TO AFFECTED AREA TWICE A DAY AS NEEDED FOR RASH   lisinopril 10 MG tablet Commonly known as: ZESTRIL Take 10 mg by mouth daily.   meclizine 25 MG  tablet Commonly known as: ANTIVERT Take 25 mg by mouth daily as needed (Vertigo).   metoprolol tartrate 100 MG tablet Commonly known as: LOPRESSOR Take 1 tablet (100 mg total) by mouth 2 (two) times daily.   Mucinex Maximum Strength 1200 MG Tb12 Generic drug: Guaifenesin Take 1 tablet by mouth daily as needed.   multivitamin with minerals Tabs tablet Take 1 tablet by mouth  daily.   oxyCODONE 5 MG immediate release tablet Commonly known as: Roxicodone Take 1 tablet (5 mg total) by mouth every 4 (four) hours as needed for severe pain.   pantoprazole 40 MG tablet Commonly known as: PROTONIX Take 40 mg by mouth 2 (two) times daily.   sertraline 50 MG tablet Commonly known as: ZOLOFT Take 1 tablet (50 mg total) by mouth daily.   vitamin B-12 1000 MCG tablet Commonly known as: CYANOCOBALAMIN Take 1,000 mcg by mouth daily.               Durable Medical Equipment  (From admission, onward)           Start     Ordered   09/29/21 0958  For home use only DME 3 n 1  Once        09/29/21 0958   09/29/21 0958  For home use only DME Walker rolling  Once       Question Answer Comment  Walker: With 5 Inch Wheels   Patient needs a walker to treat with the following condition S/P total right hip arthroplasty      09/29/21 0958   09/27/21 1719  DME Walker rolling  Once       Question:  Patient needs a walker to treat with the following condition  Answer:  History of total hip replacement, right   09/27/21 1718   09/27/21 1719  DME 3 n 1  Once        09/27/21 1718   09/27/21 1719  DME Bedside commode  Once       Question:  Patient needs a bedside commode to treat with the following condition  Answer:  History of total hip replacement, right   09/27/21 1718            Diagnostic Studies: DG HIP UNILAT WITH PELVIS 1V RIGHT  Result Date: 09/27/2021 CLINICAL DATA:  Fluoroscopic assistance for right hip arthroplasty EXAM: DG HIP (WITH OR WITHOUT PELVIS) 1V RIGHT  COMPARISON:  02/21/2018 FINDINGS: Fluoroscopic images show interval right hip arthroplasty. Fluoroscopic time was 10 seconds. Radiation dose 1.38 mGy. IMPRESSION: Fluoroscopic assistance was provided for right hip arthroplasty. Electronically Signed   By: Elmer Picker M.D.   On: 09/27/2021 14:17   DG C-Arm 1-60 Min-No Report  Result Date: 09/27/2021 Fluoroscopy was utilized by the requesting physician.  No radiographic interpretation.    Disposition: Discharge disposition: 03-Skilled Nursing Facility            Signed: Carlynn Spry ,PA-C 09/30/2021, 6:41 AM

## 2021-09-30 NOTE — Plan of Care (Signed)
  Problem: Education: Goal: Knowledge of General Education information will improve Description: Including pain rating scale, medication(s)/side effects and non-pharmacologic comfort measures Outcome: Adequate for Discharge   Problem: Health Behavior/Discharge Planning: Goal: Ability to manage health-related needs will improve Outcome: Adequate for Discharge   Problem: Clinical Measurements: Goal: Ability to maintain clinical measurements within normal limits will improve Outcome: Adequate for Discharge Goal: Will remain free from infection Outcome: Adequate for Discharge Goal: Diagnostic test results will improve Outcome: Adequate for Discharge Goal: Respiratory complications will improve Outcome: Adequate for Discharge Goal: Cardiovascular complication will be avoided Outcome: Adequate for Discharge   Problem: Activity: Goal: Risk for activity intolerance will decrease Outcome: Adequate for Discharge   Problem: Nutrition: Goal: Adequate nutrition will be maintained Outcome: Adequate for Discharge   Problem: Coping: Goal: Level of anxiety will decrease Outcome: Adequate for Discharge   Problem: Elimination: Goal: Will not experience complications related to bowel motility Outcome: Adequate for Discharge Goal: Will not experience complications related to urinary retention Outcome: Adequate for Discharge   Problem: Pain Managment: Goal: General experience of comfort will improve Outcome: Adequate for Discharge   Problem: Safety: Goal: Ability to remain free from injury will improve Outcome: Adequate for Discharge   Problem: Skin Integrity: Goal: Risk for impaired skin integrity will decrease Outcome: Adequate for Discharge   Problem: Education: Goal: Knowledge of the prescribed therapeutic regimen will improve Outcome: Adequate for Discharge Goal: Understanding of discharge needs will improve Outcome: Adequate for Discharge Goal: Individualized Educational  Video(s) Outcome: Adequate for Discharge   Problem: Activity: Goal: Ability to avoid complications of mobility impairment will improve Outcome: Adequate for Discharge Goal: Ability to tolerate increased activity will improve Outcome: Adequate for Discharge   Problem: Clinical Measurements: Goal: Postoperative complications will be avoided or minimized Outcome: Adequate for Discharge   Problem: Pain Management: Goal: Pain level will decrease with appropriate interventions Outcome: Adequate for Discharge   Problem: Skin Integrity: Goal: Will show signs of wound healing Outcome: Adequate for Discharge

## 2021-09-30 NOTE — Discharge Summary (Signed)
Physician Discharge Summary  Patient ID: Cameron Foster. MRN: [id number] DOB/AGE: 09/08/1940 81 y.o.  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 b.)  CT CAP 06/22/2021: Ao root 41 mm. c.) TTE 06/26/2021: Ao root 41 mm; asc Ao 39 mm   Atrial fibrillation (HCC) 1995   a.) single episode in 1995 per patient; no long term treatment. b.) recurrent episode in the setting of GI bleeding related to colitis 06/2021.   Basal cell carcinoma 04/26/2017   Right medial cheek. Superficial and nodular   Basal cell carcinoma 06/11/2019   Left anterior shoulder. Nodular pattern   Basal cell carcinoma 09/09/2019   Right nasal ala, EDC   Basal cell carcinoma 02/05/2020   L upper eyebrow, EDC    Bilateral carotid artery stenosis 06/09/2019   a.) Carotid doppler: mod; <50% BILATERAL ICAs.   Chicken pox    Colon cancer (HCC)    Colon polyp    Depression    Son died 17-Jun-2014   Diverticulitis    Diverticulosis 30 years   Gastritis    GERD (gastroesophageal reflux disease)    H. pylori infection    History of stress test    a. 09/2015 MV: No ischemia/infarct. EF 45-54% (nl by echo).   Hyperlipidemia    Hypertension    Irritable bowel syndrome    Lacunar infarction ([place]) 06/08/2019   a.) small; RIGHT motor strip   NSVT (nonsustained ventricular tachycardia) (HCC)    a.)  Single episode lasting 5 beats at a maximum rate of 160 bpm noted on Holter study performed 08/13/2021.   Prostate cancer [facility]) 2010-06-17   a.) s/p XRT   PSVT (paroxysmal  supraventricular tachycardia) ([place])    a. 06/2019 Zio: Avg rate 74 (54-120), occas PACs, rare PVCs, 125 episodes of PVCs (longest 17.5 secs; max rate 187). No afib.   RBBB    SAH (subarachnoid hemorrhage) ([place]) 10/10/2018   Squamous cell carcinoma of skin 07/18/2017   Left medial calf. KA type   Squamous cell carcinoma of skin 04/24/2018   Right above med. brow   Squamous cell carcinoma of skin 06/11/2019   Right posterior shoulder. SCCis, hypertrophic   Squamous cell carcinoma of skin 01/09/2020   Mid nasal dorsum, MOHS, Efudex x 4wks   Systolic dysfunction    a.) TTE 10/05/2015: EF 50-55%; mild LVH; LAE; triv AR, mild MR. b.) TTE 03/29/2018: EF 55-60%; LAE, mild AR; ? small PFO. c.) TTE 06/09/2019: EF 55-60%, no rwma, triv MR/AI. d.) TTE 06/26/2021: EF 45-50%; glob HK; LAE; mild MR; Ao root 41 mm; asc Ao 39 mm.   Vasovagal syncope     Surgeries: Procedure(s): TOTAL HIP ARTHROPLASTY ANTERIOR APPROACH on 09/27/2021   Consultants (if any):   Discharged Condition: Improved  Hospital Course: Cameron Foster. is an 81 y.o. 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    He was  given perioperative antibiotics:  Anti-infectives (From admission, onward)    Start     Dose/Rate Route Frequency Ordered Stop   09/27/21 1815  ceFAZolin (ANCEF) IVPB 2g/100 mL premix        2 g 200 mL/hr over 30 Minutes Intravenous Every 6 hours 09/27/21 1718 09/28/21 0024   09/27/21 1004  ceFAZolin (ANCEF) 2-4 GM/100ML-% IVPB       Note to Pharmacy: [place] Blizzard A: cabinet override      09/27/21 1004 09/27/21 1303   09/27/21 0600  ceFAZolin (ANCEF) IVPB 2g/100 mL premix        2 g 200 mL/hr over 30 Minutes Intravenous On call to O.R. 09/27/21 0157 09/27/21 1257     .  He was given sequential compression devices,  early ambulation, and aspirin 81 mg twice daily for 30 days for DVT prophylaxis.  He benefited maximally from the hospital stay and there were no complications.    Recent vital signs:  Vitals:   09/30/21 0526 09/30/21 0821  BP: (!) 145/86 103/66  Pulse: 67 (!) 58  Resp: 20 16  Temp: 98.3 F (36.8 C) 98.7 F (37.1 C)  SpO2: 95% 97%    Recent laboratory studies:  Lab Results  Component Value Date   HGB 11.0 (L) 08/24/2021   HGB 10.7 (L) 07/15/2021   HGB 10.5 (L) 07/03/2021   Lab Results  Component Value Date   WBC 7.5 08/24/2021   PLT 250 08/24/2021   Lab Results  Component Value Date   INR 1.0 06/08/2019   Lab Results  Component Value Date   NA 140 08/24/2021   K 4.6 08/24/2021   CL 103 08/24/2021   CO2 27 08/24/2021   BUN 14 08/24/2021   CREATININE 1.37 (H) 08/24/2021   GLUCOSE 103 (H) 08/24/2021    Discharge Medications:   Allergies as of 09/30/2021       Reactions   Formaldehyde Rash   From shoes made with this material        Medication List     STOP taking these medications    aspirin EC 81 MG tablet Replaced by: aspirin 81 MG chewable tablet       TAKE these medications    aspirin 81 MG chewable tablet Chew 1 tablet (81 mg total) by mouth 2 (two) times daily. Replaces: aspirin EC 81 MG tablet   atorvastatin 40 MG tablet Commonly known as: LIPITOR TAKE 1 TABLET BY MOUTH EVERY DAY   docusate sodium 100 MG capsule Commonly known as: COLACE Take 1 capsule (100 mg total) by mouth 2 (two) times daily. What changed: when to take this   fluticasone 50 MCG/ACT nasal spray Commonly known as: FLONASE PLACE 1 SPRAY INTO BOTH NOSTRILS DAILY AS NEEDED FOR ALLERGIES OR RHINITIS.   folic acid 1 MG tablet Commonly known as: FOLVITE Take 1 tablet (1 mg total) by mouth daily.   hydrocortisone 2.5 % cream APPLY TO AFFECTED AREA TWICE A DAY AS NEEDED FOR RASH   lisinopril 10 MG tablet Commonly known as: ZESTRIL Take 10 mg by mouth daily.    meclizine 25 MG tablet Commonly known as: ANTIVERT Take 25 mg by mouth daily as needed ([place]).   metoprolol tartrate 100 MG tablet Commonly known as: LOPRESSOR Take 1 tablet (100 mg total) by mouth 2 (two) times daily.   Mucinex Maximum Strength 1200 MG Tb12 Generic drug: Guaifenesin Take 1 tablet by mouth daily as needed.   multivitamin with minerals Tabs tablet Take  1 tablet by mouth daily.   oxyCODONE 5 MG immediate release tablet Commonly known as: Roxicodone Take 1 tablet (5 mg total) by mouth every 4 (four) hours as needed for severe pain.   pantoprazole 40 MG tablet Commonly known as: PROTONIX Take 40 mg by mouth 2 (two) times daily.   sertraline 50 MG tablet Commonly known as: ZOLOFT Take 1 tablet (50 mg total) by mouth daily.   traMADol 50 MG tablet Commonly known as: ULTRAM Take 1 tablet (50 mg total) by mouth every 6 (six) hours as needed for moderate pain.   vitamin B-12 1000 MCG tablet Commonly known as: CYANOCOBALAMIN Take 1,000 mcg by mouth daily.               Durable Medical Equipment  (From admission, onward)           Start     Ordered   09/29/21 0958  For home use only DME 3 n 1  Once        09/29/21 0958   09/29/21 0958  For home use only DME Walker rolling  Once       Question Answer Comment  Walker: With 5 Inch Wheels   Patient needs a walker to treat with the following condition S/P total right hip arthroplasty      09/29/21 0958   09/27/21 1719  DME Walker rolling  Once       Question:  Patient needs a walker to treat with the following condition  Answer:  History of total hip replacement, right   09/27/21 1718   09/27/21 1719  DME 3 n 1  Once        09/27/21 1718   09/27/21 1719  DME Bedside commode  Once       Question:  Patient needs a bedside commode to treat with the following condition  Answer:  History of total hip replacement, right   09/27/21 1718            Diagnostic Studies: DG HIP UNILAT WITH PELVIS  1V RIGHT  Result Date: 09/27/2021 CLINICAL DATA:  Fluoroscopic assistance for right hip arthroplasty EXAM: DG HIP (WITH OR WITHOUT PELVIS) 1V RIGHT COMPARISON:  02/21/2018 FINDINGS: Fluoroscopic images show interval right hip arthroplasty. Fluoroscopic time was 10 seconds. Radiation dose 1.38 mGy. IMPRESSION: Fluoroscopic assistance was provided for right hip arthroplasty. Electronically Signed   By: Elmer Picker M.D.   On: 09/27/2021 14:17   DG C-Arm 1-60 Min-No Report  Result Date: 09/27/2021 Fluoroscopy was utilized by the requesting physician.  No radiographic interpretation.    Disposition: Discharge disposition: 03-Skilled Nursing Facility            Signed: Carlynn Spry ,PA-C 09/30/2021, 9:44 AM

## 2021-09-30 NOTE — TOC Progression Note (Addendum)
Transition of Care (TOC) - Progression Note    Patient Details  Name: Cameron Foster. MRN: [id number] Date of Birth: 10/18/1940  Transition of Care [facility]) CM/SW [place], RN Phone Number: 09/30/2021, 12:11 PM  Clinical Narrative:     The patient is going to [facility], I attempted to call his wife [name], She is in the room and made aware of transport, EMS called to transport, he is 2nd on list  Expected Discharge Plan: [place] Barriers to Discharge: Continued Medical Work up  Expected Discharge Plan and Services Expected Discharge Plan: [place] arrangements for the past 2 months: Single Family Home Expected Discharge Date: 09/30/21               DME Arranged: 3-N-1 DME Agency: [employer] Date DME Agency Contacted: 09/29/21 Time DME Agency Contacted: [phone] Representative spoke with at DME Agency: [place]: PT [place]: [place] ([place]) Date [place]: 09/28/21 Time [place]: 1452 Representative spoke with at [place]: [name]   Social Determinants of Health ([place]) Interventions    Readmission Risk Interventions     No data to display

## 2021-09-30 NOTE — Progress Notes (Signed)
Occupational Therapy Treatment Patient Details Name: Cameron Foster. MRN: [id number] DOB: Dec 13, 1940 Today's Date: 09/30/2021   History of present illness Pt is a 81 yo M s/p R THA 09/27/21. PMH includes HTN, Afib, CAD, CVA, GERD, cancer, HLD   OT comments  Mr. Roach was much improved this AM. He reported he had 10/10 pain earlier in the morning, but, following administration of pain medications, he was now at 1/10 pain. He was able to perform transfers, ambulation, prolonged standing tolerance, could maintain good standing balance w/o UE on RW and while reaching beyond BOS. Discussed importance of keeping pain under control, participating actively in rehab process. Will continue with PoC.   Recommendations for follow up therapy are one component of a multi-disciplinary discharge planning process, led by the attending physician.  Recommendations may be updated based on patient status, additional functional criteria and insurance authorization.    Follow Up Recommendations  Skilled nursing-short term rehab (<3 hours/day)    Assistance Recommended at Discharge Frequent or constant Supervision/Assistance  Patient can return home with the following  A little help with walking and/or transfers;A lot of help with bathing/dressing/bathroom;Assist for transportation   Equipment Recommendations  BSC/3in1    Recommendations for Other Services      Precautions / Restrictions Precautions Precautions: Anterior Hip Restrictions Weight Bearing Restrictions: Yes RLE Weight Bearing: Weight bearing as tolerated       Mobility Bed Mobility               General bed mobility comments: received, left in recliner    Transfers Overall transfer level: Needs assistance Equipment used: Rolling walker (2 wheels) Transfers: Sit to/from Stand Sit to Stand: Supervision           General transfer comment: no physical assistance required     Balance Overall balance assessment: Needs  assistance Sitting-balance support: No upper extremity supported, Feet supported Sitting balance-Leahy Scale: Good     Standing balance support: During functional activity, Bilateral upper extremity supported, No upper extremity supported Standing balance-Leahy Scale: Good Standing balance comment: able to maintain standing balance without RW                           ADL either performed or assessed with clinical judgement   ADL                                              Extremity/Trunk Assessment Upper Extremity Assessment Upper Extremity Assessment: Overall WFL for tasks assessed   Lower Extremity Assessment Lower Extremity Assessment: Generalized weakness        Vision       Perception     Praxis      Cognition Arousal/Alertness: Awake/alert Behavior During Therapy: WFL for tasks assessed/performed Overall Cognitive Status: Within Functional Limits for tasks assessed                                          Exercises Other Exercises Other Exercises: Educ re: pain mgmt, therex, home modifications    Shoulder Instructions       General Comments      Pertinent Vitals/ Pain       Pain Assessment Pain Assessment: No/denies pain Pain Score: 10-Worst  pain ever Pain Location: R hip Pain Descriptors / Indicators: Aching, Discomfort, Sharp Pain Intervention(s): Repositioned, Premedicated before session, Monitored during session, Limited activity within patient's tolerance  Home Living                                          Prior Functioning/Environment              Frequency  Min 2X/week        Progress Toward Goals  OT Goals(current goals can now be found in the care plan section)  Progress towards OT goals: Progressing toward goals  Acute Rehab OT Goals OT Goal Formulation: With patient Time For Goal Achievement: 10/13/21 Potential to Achieve Goals: Good  Plan Discharge  plan remains appropriate;Frequency remains appropriate    Co-evaluation                 AM-PAC OT "6 Clicks" Daily Activity     Outcome Measure   Help from another person eating meals?: None Help from another person taking care of personal grooming?: None Help from another person toileting, which includes using toliet, bedpan, or urinal?: A Lot Help from another person bathing (including washing, rinsing, drying)?: A Lot Help from another person to put on and taking off regular upper body clothing?: A Little Help from another person to put on and taking off regular lower body clothing?: A Lot 6 Click Score: 17    End of Session Equipment Utilized During Treatment: Rolling walker (2 wheels)  OT Visit Diagnosis: Unsteadiness on feet (R26.81);Pain;Muscle weakness (generalized) (M62.81) Pain - Right/Left: Right Pain - part of body: Hip   Activity Tolerance Patient tolerated treatment well   Patient Left in chair;with call bell/phone within reach   Nurse Communication          Time: 4462-8638 OT Time Calculation (min): 18 min  Charges: OT General Charges $OT Visit: 1 Visit OT Treatments $Self Care/Home Management : 8-22 mins  Josiah Lobo, PhD, MS, OTR/L 09/30/21, 10:27 AM

## 2021-09-30 NOTE — Progress Notes (Addendum)
  Subjective:  Patient reports pain as mild to moderate.  Not doing well with PT goals.  Objective:   VITALS:   Vitals:   09/29/21 1645 09/29/21 1927 09/29/21 2151 09/30/21 0526  BP: 121/74 (!) 111/59 135/79 (!) 145/86  Pulse: 60 60 68 67  Resp: 18 20  20   Temp: 97.6 F (36.4 C) 98 F (36.7 C)  98.3 F (36.8 C)  TempSrc:      SpO2: 96% 96%  95%  Weight:      Height:        PHYSICAL EXAM:  Neurologically intact ABD soft Neurovascular intact Sensation intact distally Intact pulses distally Dorsiflexion/Plantar flexion intact Incision: dressing C/D/I No cellulitis present Compartment soft  LABS  No results found for this or any previous visit (from the past 24 hour(s)).  No results found.  Assessment/Plan: 3 Days Post-Op   Principal Problem:   History of total hip replacement, right   Advance diet Up with therapy Discharge to SNF when bed available   Carlynn Spry , PA-C 09/30/2021, 6:38 AM

## 2021-09-30 NOTE — TOC Progression Note (Signed)
Transition of Care (TOC) - Progression Note    Patient Details  Name: Cameron Foster. MRN: [id number] Date of Birth: 1941-03-08  Transition of Care [facility]) CM/SW [place], RN Phone Number: 09/30/2021, 8:39 AM  Clinical Narrative:    Met with the patient and reviewed the bed offers, He will call his friend/preacher and discuss the option and let me know a bed choice   Expected Discharge Plan: [place] Barriers to Discharge: Continued Medical Work up  Expected Discharge Plan and Services Expected Discharge Plan: [place] arrangements for the past 2 months: Single Family Home Expected Discharge Date: 09/30/21               DME Arranged: 3-N-1 DME Agency: [employer] Date DME Agency Contacted: 09/29/21 Time DME Agency Contacted: [phone] Representative spoke with at DME Agency: [place]: PT [place]: [place] ([place]) Date [place]: 09/28/21 Time [place]: 1452 Representative spoke with at [place]: [name]   Social Determinants of Health ([place]) Interventions    Readmission Risk Interventions     No data to display

## 2021-10-01 ENCOUNTER — Other Ambulatory Visit: Payer: Self-pay | Admitting: Internal Medicine

## 2021-10-01 DIAGNOSIS — K5904 Chronic idiopathic constipation: Secondary | ICD-10-CM | POA: Diagnosis not present

## 2021-10-01 DIAGNOSIS — K219 Gastro-esophageal reflux disease without esophagitis: Secondary | ICD-10-CM | POA: Diagnosis not present

## 2021-10-01 DIAGNOSIS — E782 Mixed hyperlipidemia: Secondary | ICD-10-CM | POA: Diagnosis not present

## 2021-10-01 DIAGNOSIS — I480 Paroxysmal atrial fibrillation: Secondary | ICD-10-CM | POA: Diagnosis not present

## 2021-10-01 DIAGNOSIS — M1611 Unilateral primary osteoarthritis, right hip: Secondary | ICD-10-CM | POA: Diagnosis not present

## 2021-10-01 DIAGNOSIS — I10 Essential (primary) hypertension: Secondary | ICD-10-CM | POA: Diagnosis not present

## 2021-10-01 DIAGNOSIS — M150 Primary generalized (osteo)arthritis: Secondary | ICD-10-CM | POA: Diagnosis not present

## 2021-10-04 DIAGNOSIS — M150 Primary generalized (osteo)arthritis: Secondary | ICD-10-CM | POA: Diagnosis not present

## 2021-10-04 DIAGNOSIS — I10 Essential (primary) hypertension: Secondary | ICD-10-CM | POA: Diagnosis not present

## 2021-10-04 DIAGNOSIS — E782 Mixed hyperlipidemia: Secondary | ICD-10-CM | POA: Diagnosis not present

## 2021-10-04 DIAGNOSIS — M1611 Unilateral primary osteoarthritis, right hip: Secondary | ICD-10-CM | POA: Diagnosis not present

## 2021-10-04 DIAGNOSIS — K5904 Chronic idiopathic constipation: Secondary | ICD-10-CM | POA: Diagnosis not present

## 2021-10-04 DIAGNOSIS — I480 Paroxysmal atrial fibrillation: Secondary | ICD-10-CM | POA: Diagnosis not present

## 2021-10-04 DIAGNOSIS — K219 Gastro-esophageal reflux disease without esophagitis: Secondary | ICD-10-CM | POA: Diagnosis not present

## 2021-10-05 DIAGNOSIS — Z471 Aftercare following joint replacement surgery: Secondary | ICD-10-CM | POA: Diagnosis not present

## 2021-10-05 DIAGNOSIS — H81399 Other peripheral vertigo, unspecified ear: Secondary | ICD-10-CM | POA: Diagnosis not present

## 2021-10-05 DIAGNOSIS — I10 Essential (primary) hypertension: Secondary | ICD-10-CM | POA: Diagnosis not present

## 2021-10-05 DIAGNOSIS — K5904 Chronic idiopathic constipation: Secondary | ICD-10-CM | POA: Diagnosis not present

## 2021-10-05 DIAGNOSIS — M150 Primary generalized (osteo)arthritis: Secondary | ICD-10-CM | POA: Diagnosis not present

## 2021-10-05 DIAGNOSIS — Z96641 Presence of right artificial hip joint: Secondary | ICD-10-CM | POA: Diagnosis not present

## 2021-10-05 DIAGNOSIS — I480 Paroxysmal atrial fibrillation: Secondary | ICD-10-CM | POA: Diagnosis not present

## 2021-10-05 DIAGNOSIS — I700 Atherosclerosis of aorta: Secondary | ICD-10-CM | POA: Diagnosis not present

## 2021-10-05 DIAGNOSIS — K219 Gastro-esophageal reflux disease without esophagitis: Secondary | ICD-10-CM | POA: Diagnosis not present

## 2021-10-05 DIAGNOSIS — F330 Major depressive disorder, recurrent, mild: Secondary | ICD-10-CM | POA: Diagnosis not present

## 2021-10-05 DIAGNOSIS — E782 Mixed hyperlipidemia: Secondary | ICD-10-CM | POA: Diagnosis not present

## 2021-10-08 DIAGNOSIS — I480 Paroxysmal atrial fibrillation: Secondary | ICD-10-CM | POA: Diagnosis not present

## 2021-10-08 DIAGNOSIS — K219 Gastro-esophageal reflux disease without esophagitis: Secondary | ICD-10-CM | POA: Diagnosis not present

## 2021-10-08 DIAGNOSIS — M150 Primary generalized (osteo)arthritis: Secondary | ICD-10-CM | POA: Diagnosis not present

## 2021-10-08 DIAGNOSIS — E782 Mixed hyperlipidemia: Secondary | ICD-10-CM | POA: Diagnosis not present

## 2021-10-08 DIAGNOSIS — I10 Essential (primary) hypertension: Secondary | ICD-10-CM | POA: Diagnosis not present

## 2021-10-08 DIAGNOSIS — M1611 Unilateral primary osteoarthritis, right hip: Secondary | ICD-10-CM | POA: Diagnosis not present

## 2021-10-08 DIAGNOSIS — K5904 Chronic idiopathic constipation: Secondary | ICD-10-CM | POA: Diagnosis not present

## 2021-10-11 ENCOUNTER — Ambulatory Visit: Payer: PRIVATE HEALTH INSURANCE | Admitting: Family Medicine

## 2021-10-11 ENCOUNTER — Ambulatory Visit: Payer: Medicare Other | Admitting: Family Medicine

## 2021-10-11 DIAGNOSIS — M150 Primary generalized (osteo)arthritis: Secondary | ICD-10-CM | POA: Diagnosis not present

## 2021-10-11 DIAGNOSIS — K5904 Chronic idiopathic constipation: Secondary | ICD-10-CM | POA: Diagnosis not present

## 2021-10-11 DIAGNOSIS — I480 Paroxysmal atrial fibrillation: Secondary | ICD-10-CM | POA: Diagnosis not present

## 2021-10-11 DIAGNOSIS — I10 Essential (primary) hypertension: Secondary | ICD-10-CM | POA: Diagnosis not present

## 2021-10-11 DIAGNOSIS — E782 Mixed hyperlipidemia: Secondary | ICD-10-CM | POA: Diagnosis not present

## 2021-10-11 DIAGNOSIS — K219 Gastro-esophageal reflux disease without esophagitis: Secondary | ICD-10-CM | POA: Diagnosis not present

## 2021-10-11 DIAGNOSIS — M1611 Unilateral primary osteoarthritis, right hip: Secondary | ICD-10-CM | POA: Diagnosis not present

## 2021-10-13 ENCOUNTER — Ambulatory Visit: Payer: Self-pay

## 2021-10-13 ENCOUNTER — Ambulatory Visit (INDEPENDENT_AMBULATORY_CARE_PROVIDER_SITE_OTHER): Payer: Medicare Other | Admitting: Physician Assistant

## 2021-10-13 ENCOUNTER — Encounter: Payer: Self-pay | Admitting: Physician Assistant

## 2021-10-13 VITALS — BP 146/84 | HR 65 | Temp 97.8°F | Resp 14

## 2021-10-13 DIAGNOSIS — I10 Essential (primary) hypertension: Secondary | ICD-10-CM

## 2021-10-13 DIAGNOSIS — Z09 Encounter for follow-up examination after completed treatment for conditions other than malignant neoplasm: Secondary | ICD-10-CM

## 2021-10-13 DIAGNOSIS — J42 Unspecified chronic bronchitis: Secondary | ICD-10-CM | POA: Insufficient documentation

## 2021-10-13 NOTE — Telephone Encounter (Signed)
     Chief Complaint: Pt. At [facility] after hip surgery 09/27/21. States his BP was high last night "and they gave me medicine for it.This morning it is 173/93."  Symptoms: Headache. Instructed to let his nurse know. Frequency: Last night Pertinent Negatives: Patient denies any other symptoms Disposition: [] ED /[] Urgent Care (no appt availability in office) / [] Appointment(In office/virtual)/ []  [place] [facility]/ [] Home Care/ [] Refused Recommended Disposition /[] [place] [facility]/ [x]  Follow-up with PCP Additional Notes: Pt. States he does not know when he will be released from facility. Asking about appointment. Instructed to call back when he knows his release date and we can make appointment.  Answer Assessment - Initial Assessment Questions 1. BLOOD PRESSURE: "What is the blood pressure?" "Did you take at least two measurements 5 minutes apart?"     173/93   today 2. ONSET: "When did you take your blood pressure?"     Last night 3. HOW: "How did you obtain the blood pressure?" (e.g., visiting nurse, automatic home BP monitor)     Nurse 4. HISTORY: "Do you have a history of high blood pressure?"     Yes 5. MEDICATIONS: "Are you taking any medications for blood pressure?" "Have you missed any doses recently?"     No 6. OTHER SYMPTOMS: "Do you have any symptoms?" (e.g., headache, chest pain, blurred vision, difficulty breathing, weakness)     Headache 7. PREGNANCY: "Is there any chance you are pregnant?" "When was your last menstrual period?"     N/a  Protocols used: Blood Pressure - High-A-AH

## 2021-10-13 NOTE — Progress Notes (Signed)
I,Roshena L Chambers,acting as a Education administrator for [employer], PA-C.,have documented all relevant documentation on the behalf of Mardene Speak, PA-C,as directed by  [employer], PA-C while in the presence of [employer], PA-C.   Established patient visit   Patient: Cameron Foster.   DOB: 12-18-1940   81 y.o. Male  MRN: [id number] Visit Date: 10/13/2021  Today's healthcare provider: Mardene Speak, PA-C   Chief Complaint  Patient presents with   Hospitalization Follow-up   Hypertension   Subjective    HPI  Follow up Hospitalization  Patient was admitted to [facility] on 09/27/2021 and discharged on 09/30/2021 for right hip replacement. Patient was discharged from [facility] to [facility] for rehabilitation. Patient reports his blood pressure medications were mixed up, which caused his blood pressure to be elevated as high as 212/ 193. He reports good compliance with treatment. He reports this condition is improved.     He takes lisinopril and metoprolol daily  ----------------------------------------------------------------------------------------  Medications: Outpatient Medications Prior to Visit  Medication Sig   aspirin 81 MG chewable tablet Chew 1 tablet (81 mg total) by mouth 2 (two) times daily.   atorvastatin (LIPITOR) 40 MG tablet TAKE 1 TABLET BY MOUTH EVERY DAY   docusate sodium (COLACE) 100 MG capsule Take 1 capsule (100 mg total) by mouth 2 (two) times daily.   fluticasone (FLONASE) 50 MCG/ACT nasal spray PLACE 1 SPRAY INTO BOTH NOSTRILS DAILY AS NEEDED FOR ALLERGIES OR RHINITIS.   Guaifenesin (MUCINEX MAXIMUM STRENGTH) 1200 MG TB12 Take 1 tablet by mouth daily as needed.   hydrocortisone 2.5 % cream APPLY TO AFFECTED AREA TWICE A DAY AS NEEDED FOR RASH   lisinopril (ZESTRIL) 10 MG tablet Take 10 mg by mouth daily.   meclizine (ANTIVERT) 25 MG tablet Take 25 mg by mouth daily as needed (Vertigo).   Multiple Vitamin (MULTIVITAMIN WITH MINERALS) TABS tablet Take 1  tablet by mouth daily.   oxyCODONE (ROXICODONE) 5 MG immediate release tablet Take 1 tablet (5 mg total) by mouth every 4 (four) hours as needed for severe pain.   pantoprazole (PROTONIX) 40 MG tablet Take 40 mg by mouth 2 (two) times daily.   sertraline (ZOLOFT) 50 MG tablet Take 1 tablet (50 mg total) by mouth daily.   traMADol (ULTRAM) 50 MG tablet Take 1 tablet (50 mg total) by mouth every 6 (six) hours as needed for moderate pain.   vitamin B-12 (CYANOCOBALAMIN) 1000 MCG tablet Take 1,000 mcg by mouth daily.   metoprolol tartrate (LOPRESSOR) 100 MG tablet Take 1 tablet (100 mg total) by mouth 2 (two) times daily.   No facility-administered medications prior to visit.    Review of Systems  Constitutional:  Negative for appetite change, chills and fever.  Respiratory:  Negative for chest tightness, shortness of breath and wheezing.   Cardiovascular:  Positive for leg swelling (right leg, chronic and stable). Negative for chest pain and palpitations.  Gastrointestinal:  Negative for abdominal pain, nausea and vomiting.       Objective    BP (!) 146/84 (BP Location: Right Arm, Patient Position: Sitting, Cuff Size: Large)   Pulse 65   Temp 97.8 F (36.6 C) (Oral)   Resp 14   SpO2 97% Comment: room air  Today's Vitals   10/13/21 1050 10/13/21 1053  BP: (!) 143/85 (!) 146/84  Pulse: 65   Resp: 14   Temp: 97.8 F (36.6 C)   TempSrc: Oral   SpO2: 97%  There is no height or weight on file to calculate BMI.    Physical Exam Vitals reviewed.  Constitutional:      General: He is not in acute distress.    Appearance: Normal appearance. He is obese. He is not diaphoretic.  HENT:     Head: Normocephalic and atraumatic.     Nose: Rhinorrhea present. No congestion.     Mouth/Throat:     Comments: Postnasal drainage noted Eyes:     General: No scleral icterus.       Right eye: No discharge.        Left eye: No discharge.     Conjunctiva/sclera: Conjunctivae normal.      Pupils: Pupils are equal, round, and reactive to light.  Cardiovascular:     Rate and Rhythm: Normal rate and regular rhythm.     Pulses: Normal pulses.     Heart sounds: Normal heart sounds. No murmur heard. Pulmonary:     Effort: Pulmonary effort is normal. No respiratory distress.     Breath sounds: Rhonchi and rales present. No wheezing.  Musculoskeletal:        General: Normal range of motion.     Cervical back: Normal range of motion and neck supple.     Right lower leg: No edema.     Left lower leg: No edema.  Lymphadenopathy:     Cervical: No cervical adenopathy.  Skin:    General: Skin is warm and dry.     Findings: No rash.  Neurological:     General: No focal deficit present.     Mental Status: He is alert and oriented to person, place, and time. Mental status is at baseline.  Psychiatric:        Mood and Affect: Mood normal.        Behavior: Behavior normal.        Thought Content: Thought content normal.        Judgment: Judgment normal.     No results found for any visits on 10/13/21.  Assessment & Plan     1. Essential hypertension BP today was 146/84 Continue his current BP medications Advised to measure his BP at home and bring his BP log to the next appt. Pt expressed his understanding and agreed.  2. Chronic bronchitis, unspecified chronic bronchitis type (HCC) Chronic, persistent productive cough, abnormal lung exam Last CXR was done 06/27/21 showed: 1. Low lung volumes with diffuse peribronchial cuffing, which appears to be chronic compared to the prior study, favored to reflect a chronic bronchitis. No definite acute findings are noted. 2. Aortic atherosclerosis.   - DG Chest 2 View; Future Continue to use Flonase, antihistamines  A sample of Spiriva was provided Continue to take Protonix Might Rxed inhalers  3. Hospital discharge follow-up Stiches were removed recently. Healing well. Has a scheduled FU with his surgeon.  Was accompanied by  his lovely wife of 54 y. Cresenciano Lick in 2 weeks I discussed the assessment and treatment plan with the patient  The patient was provided an opportunity to ask questions and all were answered. The patient agreed with the plan and demonstrated an understanding of the instructions.   The patient was advised to call back or seek an in-person evaluation if the symptoms worsen or if the condition fails to improve as anticipated. Portions of this note were created using dictation software and may contain typographical errors.    Total encounter time more than 30 minutes  Greater than 50% was  spent in counseling and coordination of care with the patient  Elberta Leatherwood  [facility] [phone] (phone) [phone] (fax)  [place]

## 2021-10-16 ENCOUNTER — Other Ambulatory Visit: Payer: Self-pay | Admitting: Family Medicine

## 2021-10-16 DIAGNOSIS — I480 Paroxysmal atrial fibrillation: Secondary | ICD-10-CM

## 2021-10-16 DIAGNOSIS — I519 Heart disease, unspecified: Secondary | ICD-10-CM

## 2021-10-16 DIAGNOSIS — I10 Essential (primary) hypertension: Secondary | ICD-10-CM

## 2021-10-18 NOTE — Telephone Encounter (Unsigned)
Please, consult with Cardiology. Meanwhile, I will sent both meds to your pharmacy

## 2021-10-21 ENCOUNTER — Telehealth: Payer: Self-pay | Admitting: Family Medicine

## 2021-10-21 NOTE — Telephone Encounter (Signed)
Denise calling from [employer] is calling to request verbal orders. Pt called to request hospice orders after declining few weeks.   Needing new orders Cb- [phone] option 2

## 2021-10-22 NOTE — Telephone Encounter (Signed)
Please Review. [place] for verbal orders?

## 2021-10-22 NOTE — Telephone Encounter (Signed)
Denies called and informed.

## 2021-10-26 ENCOUNTER — Ambulatory Visit: Payer: PRIVATE HEALTH INSURANCE | Admitting: Physician Assistant

## 2021-10-26 ENCOUNTER — Telehealth: Payer: Self-pay

## 2021-10-26 DIAGNOSIS — J42 Unspecified chronic bronchitis: Secondary | ICD-10-CM

## 2021-10-26 DIAGNOSIS — I10 Essential (primary) hypertension: Secondary | ICD-10-CM

## 2021-10-26 NOTE — Progress Notes (Signed)
Per Clinical pharmacist,please reach out to patient today, he had a question regarding his medications.    Patient states he wants to know what medication he suppose to be taking because when he had his hip surgery recently  the provider stop some medication and his list he has on hand is incorrect. Patient reports we can e-mail his list to him at [email or web address], or he will print it off his mychart.Patient is asking to update his medication list in his chart as he refers to this.Notified Clinical pharmacist  Per Clinical pharmacist: His medication list on mychart should be up to date. The new changes from his surgery from Dr. Linton Rump include:   -Aspirin TWICE daily   -Docusate twice daily for constipation   -Oxycodone every 4 hours as needed for pain   -Tramadol every 6 hours as needed for pain   Patient verbalized understanding and states he only is taking Tramadol for pain.Patient reports he may go and get oxycodone fill if he feels he needs it.  [facility] Pharmacist Assistant [phone]

## 2021-10-26 NOTE — Telephone Encounter (Signed)
Will do!

## 2021-10-26 NOTE — Progress Notes (Deleted)
I,Jana Meleane Selinger,acting as a Education administrator for [employer], PA-C.,have documented all relevant documentation on the behalf of Cameron Speak, PA-C,as directed by  [employer], PA-C while in the presence of [employer], PA-C.   Established patient visit   Patient: Cameron Foster.   DOB: 11/24/1940   81 y.o. Male  MRN: [id number] Visit Date: 10/26/2021  Today's healthcare provider: Mardene Speak, PA-C   No chief complaint on file.  Subjective    Hypertension, follow-up  BP Readings from Last 3 Encounters:  10/13/21 (!) 146/84  09/30/21 133/72  09/21/21 (!) 141/96   Wt Readings from Last 3 Encounters:  09/27/21 203 lb (92.1 kg)  09/21/21 203 lb (92.1 kg)  08/23/21 206 lb (93.4 kg)     He was last seen for hypertension 2 weeks ago.  BP at that visit was 146/84. Management since that visit Continue his current BP medications Advised to measure his BP at home and bring his BP log to the next appt.  He reports {excellent/good/fair/poor:19665} compliance with treatment. He {is/is not:9024} having side effects.  He is following a {diet:21022986} diet. He {is/is not:9024} exercising. He {does/does not:200015} smoke.  Use of agents associated with hypertension: {bp agents assoc with hypertension:511::"none"}.   Outside blood pressures are  Symptoms: {Yes/No:20286} chest pain {Yes/No:20286} chest pressure  {Yes/No:20286} palpitations {Yes/No:20286} syncope  {Yes/No:20286} dyspnea {Yes/No:20286} orthopnea  {Yes/No:20286} paroxysmal nocturnal dyspnea {Yes/No:20286} lower extremity edema   Pertinent labs Lab Results  Component Value Date   CHOL 156 08/15/2019   HDL 46 08/15/2019   LDLCALC 85 08/15/2019   TRIG 144 08/15/2019   CHOLHDL 4.4 06/09/2019   Lab Results  Component Value Date   NA 140 08/24/2021   K 4.6 08/24/2021   CREATININE 1.37 (H) 08/24/2021   EGFR 52 (L) 08/24/2021   GLUCOSE 103 (H) 08/24/2021   TSH 3.077 06/28/2021     The ASCVD Risk score (Arnett  DK, et al., 2019) failed to calculate for the following reasons:   The 2019 ASCVD risk score is only valid for ages 64 to 80   The patient has a prior MI or stroke diagnosis  --------------------------------------------------------------------------------------------------- Follow up for Chronic bronchitis, unspecified chronic bronchitis type ([place]) Chronic, persistent productive cough, abnormal lung exam  The patient was last seen for this 2 weeks ago. Changes made at last visit include - DG Chest 2 View/result in chart  Continue to use Flonase, antihistamines  A sample of Spiriva was provided Continue to take Protonix Might Rxed inhalers.  He reports {excellent/good/fair/poor:19665} compliance with treatment. He feels that condition is {improved/worse/unchanged:3041574}. He {is/is not:21021397} having side effects. ***  -----------------------------------------------------------------------------------------   Medications: Outpatient Medications Prior to Visit  Medication Sig   aspirin 81 MG chewable tablet Chew 1 tablet (81 mg total) by mouth 2 (two) times daily.   atorvastatin (LIPITOR) 40 MG tablet TAKE 1 TABLET BY MOUTH EVERY DAY   docusate sodium (COLACE) 100 MG capsule Take 1 capsule (100 mg total) by mouth 2 (two) times daily.   fluticasone (FLONASE) 50 MCG/ACT nasal spray PLACE 1 SPRAY INTO BOTH NOSTRILS DAILY AS NEEDED FOR ALLERGIES OR RHINITIS.   Guaifenesin (MUCINEX MAXIMUM STRENGTH) 1200 MG TB12 Take 1 tablet by mouth daily as needed.   hydrocortisone 2.5 % cream APPLY TO AFFECTED AREA TWICE A DAY AS NEEDED FOR RASH   lisinopril (ZESTRIL) 10 MG tablet Take 10 mg by mouth daily.   meclizine (ANTIVERT) 25 MG tablet Take 25 mg by mouth daily  as needed (Vertigo).   metoprolol tartrate (LOPRESSOR) 100 MG tablet Take 1 tablet (100 mg total) by mouth 2 (two) times daily.   Multiple Vitamin (MULTIVITAMIN WITH MINERALS) TABS tablet Take 1 tablet by mouth daily.   oxyCODONE  (ROXICODONE) 5 MG immediate release tablet Take 1 tablet (5 mg total) by mouth every 4 (four) hours as needed for severe pain.   pantoprazole (PROTONIX) 40 MG tablet Take 40 mg by mouth 2 (two) times daily.   sertraline (ZOLOFT) 50 MG tablet Take 1 tablet (50 mg total) by mouth daily.   traMADol (ULTRAM) 50 MG tablet Take 1 tablet (50 mg total) by mouth every 6 (six) hours as needed for moderate pain.   vitamin B-12 (CYANOCOBALAMIN) 1000 MCG tablet Take 1,000 mcg by mouth daily.   No facility-administered medications prior to visit.    Review of Systems  {Labs  Heme  Chem  Endocrine  Serology  Results Review (optional):23779}   Objective    There were no vitals taken for this visit. {Show previous vital signs (optional):23777}  Physical Exam  ***  No results found for any visits on 10/26/21.  Assessment & Plan     ***  No follow-ups on file.      {provider attestation***:1}   Cameron Foster, Hershal Coria  [facility] [phone] (phone) [phone] (fax)  [place]

## 2021-10-26 NOTE — Telephone Encounter (Signed)
Copied from [place] [id number]. Topic: General - Call Back - No Documentation >> Oct 26, 2021 12:50 PM Oley Balm E wrote: Reason for CRM: Pt wants to speak to [facility] the pharmacist, please advise   [phone] or [phone]

## 2021-10-29 ENCOUNTER — Ambulatory Visit: Payer: Self-pay | Admitting: *Deleted

## 2021-10-29 ENCOUNTER — Other Ambulatory Visit: Payer: Self-pay | Admitting: Family Medicine

## 2021-10-29 ENCOUNTER — Other Ambulatory Visit: Payer: Self-pay | Admitting: Internal Medicine

## 2021-10-29 ENCOUNTER — Encounter: Payer: Self-pay | Admitting: Internal Medicine

## 2021-10-29 ENCOUNTER — Telehealth: Payer: Self-pay | Admitting: Internal Medicine

## 2021-10-29 ENCOUNTER — Ambulatory Visit: Payer: Self-pay

## 2021-10-29 DIAGNOSIS — I10 Essential (primary) hypertension: Secondary | ICD-10-CM

## 2021-10-29 DIAGNOSIS — I110 Hypertensive heart disease with heart failure: Secondary | ICD-10-CM

## 2021-10-29 MED ORDER — LISINOPRIL-HYDROCHLOROTHIAZIDE 20-25 MG PO TABS: 1.0000 | ORAL_TABLET | Freq: Every day | ORAL | 1 refills | Status: DC

## 2021-10-29 MED ORDER — METOPROLOL TARTRATE 100 MG PO TABS
100.0000 mg | ORAL_TABLET | Freq: Two times a day (BID) | ORAL | 1 refills | Status: DC
Start: 2021-10-29 — End: 2022-04-22

## 2021-10-29 NOTE — Telephone Encounter (Signed)
See below.  Thank you

## 2021-10-29 NOTE — Telephone Encounter (Signed)
Pt c/o medication issue:  1. Name of Medication:  Amlodipine 5 mg Lasix 20 mg  2. How are you currently taking this medication (dosage and times per day)? NA  3. Are you having a reaction (difficulty breathing--STAT)? No  4. What is your medication issue? Pt states that medications are not on his current med list and would like to know if his still needs to be taking. Please advise

## 2021-10-29 NOTE — Telephone Encounter (Signed)
Please advise if ok to refill looks like pt has concerns if he should continue pending mychart message.

## 2021-10-29 NOTE — Telephone Encounter (Signed)
Patient sent my chart message and I advised that we were reviewing his chart to determine if there have been any changes.

## 2021-10-29 NOTE — Telephone Encounter (Signed)
Call transferred from agent and no one answered. Call disconnected and NT was going to call back and patient called and speaking with another NT.

## 2021-10-29 NOTE — Telephone Encounter (Signed)
*  STAT* If patient is at the pharmacy, call can be transferred to refill team.   1. Which medications need to be refilled? (please list name of each medication and dose if known) new prescription for Metoprolol  2. Which pharmacy/location (including street and city if local pharmacy) is medication to be sent to? [facility]  [address], [place]  3. Do they need a 30 day or 90 day supply? 90 days and refills

## 2021-10-29 NOTE — Telephone Encounter (Signed)
See other telephone encounter.

## 2021-10-29 NOTE — Telephone Encounter (Signed)
Spoke with pt. Reviewed his medications. Pt is been taking Furosemide 41m daily and amlodipine 571mdaily. These were prescribed by Dr. KuDwyane Deeer pt. When reviewing chart, seen where these were dc'd during hospital visit in March. Pt is just needing to know if he supposed to be taking them or not and if so he is needing a refill. I advised him I would send messaged to provider for review and someone fu with him. Pt verbalized understanding and wants a call back soon so he knows what he's supposed to do.

## 2021-10-29 NOTE — Telephone Encounter (Signed)
  Chief Complaint: Pt unsure of medicines he should be taking Symptoms: none Frequency: now Pertinent Negatives: Patient denies  Disposition: [] ED /[] Urgent Care (no appt availability in office) / [] Appointment(In office/virtual)/ []  [facility]/ [] Home Care/ [] Refused Recommended Disposition /[]  Mobile Bus/ [x]  Follow-up with PCP Additional Notes: Laxis and amlodipine are not on med list. Cardiology is asking if pt should be taking metoprolol. Please review chart and return pt's call.    Summary: Medication Management   Pt stated has a medication problem and would like to speak with a nurse about medication furosemide and amlodipine and why they are not on his chart as active medications on MyChart if he is currently taking the medication.    Pt seeking clinical advice.      Reason for Disposition  [1] Caller has URGENT medicine question about med that PCP or specialist prescribed AND [2] triager unable to answer question  Answer Assessment - Initial Assessment Questions 1. NAME of MEDICATION: "What medicine are you calling about?"     Amlodipine, Lasix, and metoprolol 2. QUESTION: "What is your question?" (e.g., double dose of medicine, side effect)     Lasix and amlodipine are not listed on pts med list. Cardiology sent a message today asking if pt is supposed to be taking this.Metoprolol  3. PRESCRIBING HCP: "Who prescribed it?" Reason: if prescribed by specialist, call should be referred to that group.     [name] 4. SYMPTOMS: "Do you have any symptoms?"     no 5. SEVERITY: If symptoms are present, ask "Are they mild, moderate or severe?"      6. PREGNANCY:  "Is there any chance that you are pregnant?" "When was your last menstrual period?"     na  Protocols used: Medication Question Call-A-AH

## 2021-10-29 NOTE — H&P (Signed)
NAME: Cameron Foster. MRN:   [id number] DOB:   February 17, 1941     HISTORY AND PHYSICAL  CHIEF COMPLAINT:  right hip pain  HISTORY:    Cameron Fosteris a 81 y.o. male  with right  Hip Pain Patient complains of right hip pain. Onset of the symptoms was several years ago. Inciting event: known DJD. The patient reports the hip pain is worse with weight bearing. Associated symptoms: none. Aggravating symptoms include: any weight bearing. Patient has had prior hip problems. Previous visits for this problem: multiple, this is a longstanding diagnosis. Last seen several weeks ago by me. Evaluation to date: plain films, which were abnormal  osteoarthritis . Treatment to date: OTC analgesics, which have been somewhat effective, prescription analgesics, which have been somewhat effective, and physical therapy, which has been somewhat effective.    Plan for right total hip replacement  PAST MEDICAL HISTORY:   Past Medical History:  Diagnosis Date   Alcoholism ([place])    Aortic atherosclerosis ([place])    Arthritis    Ascending aorta dilation ([place]) 03/28/2018   a.) Vascular US: prox asc Ao measured 29 mm. b.)  CT CAP 06/22/2021: Ao root 41 mm. c.) TTE 06/26/2021: Ao root 41 mm; asc Ao 39 mm   Atrial fibrillation (HCC) 1995   a.) single episode in 1995 per patient; no long term treatment. b.) recurrent episode in the setting of GI bleeding related to colitis 06/2021.   Basal cell carcinoma 04/26/2017   Right medial cheek. Superficial and nodular   Basal cell carcinoma 06/11/2019   Left anterior shoulder. Nodular pattern   Basal cell carcinoma 09/09/2019   Right nasal ala, EDC   Basal cell carcinoma 02/05/2020   L upper eyebrow, EDC    Bilateral carotid artery stenosis 06/09/2019   a.) Carotid doppler: mod; <50% BILATERAL ICAs.   Chicken pox    Colon cancer (HCC)    Colon polyp    Depression    Son died 2014-06-26   Diverticulitis    Diverticulosis 30 years   Gastritis    GERD (gastroesophageal  reflux disease)    H. pylori infection    History of stress test    a. 09/2015 MV: No ischemia/infarct. EF 45-54% (nl by echo).   Hyperlipidemia    Hypertension    Irritable bowel syndrome    Lacunar infarction ([place]) 06/08/2019   a.) small; RIGHT motor strip   NSVT (nonsustained ventricular tachycardia) (HCC)    a.)  Single episode lasting 5 beats at a maximum rate of 160 bpm noted on Holter study performed 08/13/2021.   Prostate cancer [facility]) Jun 26, 2010   a.) s/p XRT   PSVT (paroxysmal supraventricular tachycardia) ([place])    a. 06/2019 Zio: Avg rate 74 (54-120), occas PACs, rare PVCs, 125 episodes of PVCs (longest 17.5 secs; max rate 187). No afib.   RBBB    SAH (subarachnoid hemorrhage) ([place]) 10/10/2018   Squamous cell carcinoma of skin 07/18/2017   Left medial calf. KA type   Squamous cell carcinoma of skin 04/24/2018   Right above med. brow   Squamous cell carcinoma of skin 06/11/2019   Right posterior shoulder. SCCis, hypertrophic   Squamous cell carcinoma of skin 01/09/2020   Mid nasal dorsum, MOHS, Efudex x 4wks   Systolic dysfunction    a.) TTE 10/05/2015: EF 50-55%; mild LVH; LAE; triv AR, mild MR. b.) TTE 03/29/2018: EF 55-60%; LAE, mild AR; ? small PFO. c.) TTE 06/09/2019: EF 55-60%, no rwma, triv  MR/AI. d.) TTE 06/26/2021: EF 45-50%; glob HK; LAE; mild MR; Ao root 41 mm; asc Ao 39 mm.   Vasovagal syncope     PAST SURGICAL HISTORY:   Past Surgical History:  Procedure Laterality Date   CARDIAC CATHETERIZATION  1998   [place] no stents   CATARACT EXTRACTION, BILATERAL     COLON RESECTION SIGMOID N/A 12/07/2016   Procedure: COLON RESECTION SIGMOID;  Surgeon: Clayburn Pert, MD;  Location: [facility] ORS;  Service: General;  Laterality: N/A;   COLON SURGERY  11/2016   Colostomy   COLONOSCOPY  2016   COLONOSCOPY WITH PROPOFOL N/A 05/30/2016   Procedure: COLONOSCOPY WITH PROPOFOL;  Surgeon: Lollie Sails, MD;  Location: [facility] ENDOSCOPY;  Service: Endoscopy;  Laterality: N/A;    COLOSTOMY Left 12/07/2016   Procedure: COLOSTOMY;  Surgeon: Clayburn Pert, MD;  Location: [facility] ORS;  Service: General;  Laterality: Left;   COLOSTOMY REVERSAL N/A 03/21/2017   Procedure: COLOSTOMY REVERSAL;  Surgeon: Clayburn Pert, MD;  Location: [facility] ORS;  Service: General;  Laterality: N/A;   COLOSTOMY TAKEDOWN N/A 03/21/2017   Procedure: LAPAROSCOPIC COLOSTOMY TAKEDOWN;  Surgeon: Clayburn Pert, MD;  Location: [facility] ORS;  Service: General;  Laterality: N/A;   CYSTOSCOPY WITH STENT PLACEMENT Bilateral 03/21/2017   Procedure: CYSTOSCOPY WITH LIGHTED STENT PLACEMENT;  Surgeon: Abbie Sons, MD;  Location: [facility] ORS;  Service: Urology;  Laterality: Bilateral;   ESOPHAGOGASTRODUODENOSCOPY     ESOPHAGOGASTRODUODENOSCOPY N/A 05/30/2016   Procedure: ESOPHAGOGASTRODUODENOSCOPY (EGD);  Surgeon: Lollie Sails, MD;  Location: [facility] ENDOSCOPY;  Service: Endoscopy;  Laterality: N/A;   ESOPHAGOGASTRODUODENOSCOPY N/A 01/14/2021   Procedure: ESOPHAGOGASTRODUODENOSCOPY (EGD);  Surgeon: [place], Benay Pike, MD;  Location: [facility] ENDOSCOPY;  Service: Gastroenterology;  Laterality: N/A;   ESOPHAGOGASTRODUODENOSCOPY (EGD) WITH PROPOFOL N/A 04/28/2021   Procedure: ESOPHAGOGASTRODUODENOSCOPY (EGD) WITH PROPOFOL;  Surgeon: [place], Benay Pike, MD;  Location: [facility] ENDOSCOPY;  Service: Gastroenterology;  Laterality: N/A;   EYE SURGERY     cataracts   FLEXIBLE SIGMOIDOSCOPY N/A 03/21/2017   Procedure: FLEXIBLE SIGMOIDOSCOPY;  Surgeon: Clayburn Pert, MD;  Location: [facility] ORS;  Service: General;  Laterality: N/A;   FLEXIBLE SIGMOIDOSCOPY N/A 06/24/2021   Procedure: Beryle Quant;  Surgeon: Jonathon Bellows, MD;  Location: [facility] ENDOSCOPY;  Service: Gastroenterology;  Laterality: N/A;   FRACTURE SURGERY Bilateral    right arm and left wrist   INCISION AND DRAINAGE ABSCESS N/A 12/07/2016   Procedure: DRAINAGE  OF INTRA ABDOMINAL ABSCESS;  Surgeon: Clayburn Pert, MD;  Location: [facility];  Service: General;   Laterality: N/A;   LAPAROTOMY N/A 12/07/2016   Procedure: EXPLORATORY LAPAROTOMY;  Surgeon: Clayburn Pert, MD;  Location: [facility]S;  Service: General;  Laterality: N/A;   PROSTATE SURGERY     Microwave therapy   TONSILLECTOMY     TOTAL HIP ARTHROPLASTY Right 09/27/2021   Procedure: TOTAL HIP ARTHROPLASTY ANTERIOR APPROACH;  Surgeon: Lovell Sheehan, MD;  Location: [facility];  Service: Orthopedics;  Laterality: Right;   TOTAL KNEE ARTHROPLASTY Right 07/27/2016   Procedure: TOTAL KNEE ARTHROPLASTY;  Surgeon: [name], MD;  Location: [facility]S;  Service: Orthopedics;  Laterality: Right;  Dr. Erlene Quan had to place Urinary catheter due to prostate cancer history.  Using flexible scope.    MEDICATIONS:   No medications prior to admission.    ALLERGIES:   Allergies  Allergen Reactions   Formaldehyde Rash    From shoes made with this material    REVIEW OF SYSTEMS:   Negative except HPI  FAMILY HISTORY:   Family  History  Problem Relation Age of Onset   Lung cancer Father        smoker   Other Mother    Sudden death Son        due to "Blood clots"   Bipolar disorder Son    Heart disease Son    Kidney disease Daughter        congenital one small kidney   Prostate cancer Neg Hx    Bladder Cancer Neg Hx     SOCIAL HISTORY:   reports that he quit smoking about 33 years ago. His smoking use included cigarettes. He has a 27.00 pack-year smoking history. He has quit using smokeless tobacco.  His smokeless tobacco use included chew. He reports that he does not currently use alcohol after a past usage of about 7.0 standard drinks of alcohol per week. He reports that he does not use drugs.  PHYSICAL EXAM:  Head: Normocephalic, without obvious abnormality, atraumatic Neck: no JVD and supple, symmetrical, trachea midline Resp: clear to auscultation bilaterally Cardio: regular rate and rhythm, S1, S2 normal, no murmur, click, rub or gallop GI: soft, non-tender; bowel sounds normal; no  masses,  no organomegaly Extremities: extremities normal, atraumatic, no cyanosis or edema and Homans sign is negative, no sign of DVT Pulses: 2+ and symmetric Skin: Skin color, texture, turgor normal. No rashes or lesions Neurologic: Alert and oriented X 3, normal strength and tone. Normal symmetric reflexes. Normal coordination and gait    LABORATORY STUDIES: No results for input(s): "WBC", "HGB", "HCT", "PLT" in the last 72 hours.  No results for input(s): "NA", "K", "CL", "CO2", "GLUCOSE", "BUN", "CREATININE", "CALCIUM" in the last 72 hours.  STUDIES/RESULTS:  No results found.  ASSESSMENT:  End stage osteoarthritis right hip        Principal Problem:   History of total hip replacement, right    PLAN:  Right Primary Total Hip   Carlynn Spry 10/29/2021. 5:52 AM

## 2021-10-29 NOTE — Telephone Encounter (Signed)
Follow Up:    Patient wanted to find out if he needs to take Metoprolol? If he does not need it, he does not want to take it,

## 2021-10-29 NOTE — Telephone Encounter (Signed)
Spoke with patient and advised that Dr. Val Riles is who made some of the changes and refills. Given that it was a different provider he will need to check with him or his primary care provider. He verbalized understanding with no further questions at this time.

## 2021-10-29 NOTE — Telephone Encounter (Signed)
Please Review and advise

## 2021-11-01 ENCOUNTER — Encounter: Payer: Self-pay | Admitting: Family Medicine

## 2021-11-01 NOTE — Telephone Encounter (Signed)
Spoke with patient, he stated his understanding and said he will call back to schedule an appt for 4-6 weeks.

## 2021-11-02 NOTE — Telephone Encounter (Signed)
Pt last seen by Christell Faith, PA-C in office 08/23/21.  Pt was not taking furosemide at visit and no new instructions given per chart review.  Furosemide not on pt's med list.   Pt has history of confusion with medications. I see recent mychart and telephone encounters with Ryan's RN, Pam. Will forward to [facility] for review.

## 2021-11-05 DIAGNOSIS — Z96641 Presence of right artificial hip joint: Secondary | ICD-10-CM | POA: Diagnosis not present

## 2021-11-10 ENCOUNTER — Telehealth: Payer: Self-pay | Admitting: Nurse Practitioner

## 2021-11-10 NOTE — Telephone Encounter (Signed)
Attempted to contact patient to schedule Palliative Consult, no answer - left message with reason for call along with my name and call back number.

## 2021-11-12 ENCOUNTER — Other Ambulatory Visit: Payer: Self-pay | Admitting: Family Medicine

## 2021-11-12 NOTE — Telephone Encounter (Signed)
Requested medications are due for refill today.  unsure  Requested medications are on the active medications list.  yes  Last refill. 06/11/2021  Future visit scheduled.   no  Notes to clinic.  Medication listed as historical.    Requested Prescriptions  Pending Prescriptions Disp Refills   pantoprazole (PROTONIX) 40 MG tablet      Sig: Take 1 tablet (40 mg total) by mouth 2 (two) times daily.     Gastroenterology: Proton Pump Inhibitors Passed - 11/12/2021 10:58 AM      Passed - Valid encounter within last 12 months    Recent Outpatient Visits           1 month ago Essential hypertension   [facility] [place], Declo, PA-C   4 months ago Essential hypertension   [facility] [place], Dionne Bucy, MD   4 months ago Depression, major, single episode, mild [facility])   [employer], Erin E, PA-C   5 months ago Acute recurrent sinusitis, unspecified location   [employer], Dani Gobble, PA-C   9 months ago Hospital discharge follow-up   [facility] Gwyneth Sprout, FNP       Future Appointments             In 3 months Dunn, Areta Haber, PA-C [facility]   In 4 months [name], MD [place]

## 2021-11-12 NOTE — Telephone Encounter (Signed)
Medication Refill - Medication: pantoprazole (PROTONIX) 40 MG tablet   Has the patient contact their pharmacy? Yes.   (Agent: If no, request that the patient contact the pharmacy for the refill. If patient does not wish to contact the pharmacy document the reason why and proceed with request.) (Agent: If yes, when and what did the pharmacy advise?) pharmacy doesn't have refills/ medication may have been filled originally by the hospital   Preferred Pharmacy (with phone number or street name): [id number] [place] [address] [id number] Phone:  [phone] Fax:  [phone]  Has the patient been seen for an appointment in the last year OR does the patient have an upcoming appointment? Yes.    Agent: Please be advised that RX refills may take up to 3 business days. We ask that you follow-up with your pharmacy.

## 2021-11-17 ENCOUNTER — Other Ambulatory Visit: Payer: Self-pay | Admitting: Family Medicine

## 2021-11-17 ENCOUNTER — Ambulatory Visit: Payer: Self-pay

## 2021-11-17 MED ORDER — PANTOPRAZOLE SODIUM 40 MG PO TBEC: 40.0000 mg | DELAYED_RELEASE_TABLET | Freq: Two times a day (BID) | ORAL | 5 refills | Status: DC

## 2021-11-17 MED ORDER — ATORVASTATIN CALCIUM 40 MG PO TABS
40.0000 mg | ORAL_TABLET | Freq: Every day | ORAL | 3 refills | Status: DC
Start: 2021-11-17 — End: 2023-01-02

## 2021-11-17 NOTE — Telephone Encounter (Signed)
Pt stated pharmacy advised him medication atorvastatin (LIPITOR) 40 MG tablet was taken off his medication list.  Pt wants to know why and if he should continue taking medication stated he has 4 pills left and was told it was unnecessary to continue taking by the pharmacy.   Pt seeking clinical advice.  Left message to call back.

## 2021-11-17 NOTE — Telephone Encounter (Signed)
  Chief Complaint: Unsure if pt should continue Atorvastatin Symptoms:  Frequency:  Pertinent Negatives: Patient denies  Disposition: [] ED /[] Urgent Care (no appt availability in office) / [] Appointment(In office/virtual)/ []  [place] [facility]/ [] Home Care/ [] Refused Recommended Disposition /[] [place] [facility]/ [x]  Follow-up with PCP Additional Notes: PT tried to refill atorvastatin and was told by pharmacy that this medication was discontinued.  Please advise and return pt's call.    Summary: Medication Management   Pt stated pharmacy advised him medication atorvastatin (LIPITOR) 40 MG tablet was taken off his medication list.  Pt wants to know why and if he should continue taking medication stated he has 4 pills left and was told it was unnecessary to continue taking by the pharmacy.   Pt seeking clinical advice.      Reason for Disposition  [1] Caller has NON-URGENT medicine question about med that PCP prescribed AND [2] triager unable to answer question  Answer Assessment - Initial Assessment Questions 1. NAME of MEDICINE: "What medicine(s) are you calling about?"     Atorvastatin 49m 2. QUESTION: "What is your question?" (e.g., double dose of medicine, side effect)     Should pt continue to take this medication. 3. PRESCRIBER: "Who prescribed the medicine?" Reason: if prescribed by specialist, call should be referred to that group.      4. SYMPTOMS: "Do you have any symptoms?" If Yes, ask: "What symptoms are you having?"  "How bad are the symptoms (e.g., mild, moderate, severe)      5. PREGNANCY:  "Is there any chance that you are pregnant?" "When was your last menstrual period?"     na  Protocols used: Medication Question Call-A-AH

## 2021-11-18 ENCOUNTER — Telehealth: Payer: Self-pay

## 2021-11-18 NOTE — Telephone Encounter (Signed)
Pt again calling for resolution. States he is unsure if he should take Atorvastatin. States on his list but pharmacy stated was discontinued. Would like to hear from practice. Please advise. Thank you

## 2021-11-18 NOTE — Telephone Encounter (Signed)
Please Review

## 2021-11-18 NOTE — Telephone Encounter (Signed)
Copied from [place] [phone]. Topic: General - Other >> Nov 18, 2021 12:58 PM Everette C wrote: Reason for CRM: The patient would like to speak with a member of clinical staff regarding the status of their medications when possible   The patient is concerned that their atorvastatin may be discontinued   Please contact the patient further when possible

## 2021-11-18 NOTE — Telephone Encounter (Signed)
Duplicate message. 

## 2021-11-18 NOTE — Telephone Encounter (Signed)
Patient advised.

## 2021-11-19 ENCOUNTER — Telehealth: Payer: Self-pay | Admitting: Nurse Practitioner

## 2021-11-19 NOTE — Telephone Encounter (Signed)
Spoke with patient about the Palliative referral and he was looking for physical therapy services in the home and I explained to him that we did not provide that service and told him to contact his PCP to let her know so she could send a referral to get this set up for him, he agreed to do so.

## 2021-11-26 ENCOUNTER — Telehealth: Payer: Self-pay

## 2021-11-26 NOTE — Progress Notes (Signed)
Chronic Care Management APPOINTMENT REMINDER   Cameron Foster. was reminded to have all medications, supplements and any blood glucose and blood pressure readings available for review with Cameron Foster, Pharm. D, at his telephone visit on 11/29/2021 at 11:00 am.   Patient Confirm appointment.  [place] Pharmacist Assistant [phone]

## 2021-11-29 ENCOUNTER — Ambulatory Visit (INDEPENDENT_AMBULATORY_CARE_PROVIDER_SITE_OTHER): Payer: Medicare Other

## 2021-11-29 DIAGNOSIS — I10 Essential (primary) hypertension: Secondary | ICD-10-CM

## 2021-11-29 DIAGNOSIS — E785 Hyperlipidemia, unspecified: Secondary | ICD-10-CM

## 2021-11-29 NOTE — Progress Notes (Signed)
Chronic Care Management Pharmacy Note  12/16/2021 Name:  Cameron Foster. MRN:  [id number] DOB:  1940-09-18  Summary: Patient presents for CCM follow-up.   Recommendations/Changes made from today's visit: -Continue current medications  Plan: CPP follow-up 1 month  Subjective: Cameron Harm. is an 81 y.o. year old male who is a primary patient of Bacigalupo, Dionne Bucy, MD.  The CCM team was consulted for assistance with disease management and care coordination needs.    Engaged with patient by telephone for follow up visit in response to provider referral for pharmacy case management and/or care coordination services.   Consent to Services:  The patient was given information about Chronic Care Management services, agreed to services, and gave verbal consent prior to initiation of services.  Please see initial visit note for detailed documentation.   Patient Care Team: [name], MD as PCP - General (Family Medicine) End, Harrell Gave, MD as PCP - Cardiology (Cardiology) End, Harrell Gave, MD as Consulting Physician (Cardiology) [name], MD (Orthopedic Surgery) Schnier, Dolores Lory, MD (Vascular Surgery) [name], MD (Dermatology) [name], MD (Inactive) as Consulting Physician (Ophthalmology) [name], [facility] (Pharmacist)  Recent office visits:  07/09/21: patient presented to [name] for hospital follow-up.  07/06/21: Patient presented to [facility], PA-C for follow-up. Docusate, hydrocortisone, melatonine, Miralax, thiamine stopped.      Recent consult visits: 08/17/21: Dr. Saunders Revel recommended stopping Eliquis, starting Aspirin 81 mg daily.  08/12/21: Patient presented to Dr. Deloria Lair for headache. BP 190/113. Lisinopril 10 mg daily.  07/22/21: Patient presented to Dr. Saunders Revel for follow-up. Srop amiodarone.    Hospital visits: 06/22/21-07/03/21: Hospitalized due to colitis. Abx 3/8-3/14. Eliquis 5 mg twice daily. Amiodarone 200 mg twice daily.  Stop amlodipine, furosemide, lisinopril,tramadol.   Admitted to the hospital on 01/13/2021 due to Melena. Discharge date was 01/14/2021. Discharged from [place]?Medications Started at [facility] Discharge:?? -started None   Medication Changes at Hospital Discharge: -Changed none   Medications Discontinued at Hospital Discharge: -Stopped meloxicam ,stop omeprazole   Medications that remain the same after Hospital Discharge:??  -All other medications will remain the same.     Objective:  Lab Results  Component Value Date   CREATININE 1.37 (H) 08/24/2021   BUN 14 08/24/2021   GFR 57.85 (L) 02/21/2018   GFRNONAA 45 (L) 07/03/2021   GFRAA 62 08/15/2019   NA 140 08/24/2021   K 4.6 08/24/2021   CALCIUM 9.4 08/24/2021   CO2 27 08/24/2021   GLUCOSE 103 (H) 08/24/2021    Lab Results  Component Value Date/Time   HGBA1C 5.4 09/24/2020 03:12 PM   HGBA1C 5.6 06/09/2019 05:27 AM   HGBA1C 5.3 10/11/2018 12:00 AM   GFR 57.85 (L) 02/21/2018 11:56 AM   GFR 61.28 04/19/2017 03:50 PM    Last diabetic Eye exam: No results found for: "HMDIABEYEEXA"  Last diabetic Foot exam: No results found for: "HMDIABFOOTEX"   Lab Results  Component Value Date   CHOL 156 08/15/2019   HDL 46 08/15/2019   LDLCALC 85 08/15/2019   TRIG 144 08/15/2019   CHOLHDL 4.4 06/09/2019       Latest Ref Rng & Units 08/24/2021    2:16 PM 07/15/2021    3:16 PM 06/21/2021    8:35 PM  Hepatic Function  Total Protein 6.0 - 8.5 g/dL 6.9  6.4  7.6   Albumin 3.7 - 4.7 g/dL 4.0  3.7  3.9   AST 0 -  40 IU/L 22  18  26    ALT 0 - 44 IU/L 12  12  13    Alk Phosphatase 44 - 121 IU/L 71  80  79   Total Bilirubin 0.0 - 1.2 mg/dL 0.3  0.3  0.7     Lab Results  Component Value Date/Time   TSH 3.077 06/28/2021 04:26 AM   TSH 5.605 (H) 10/20/2018 09:46 PM   TSH 1.88 01/20/2015 11:22 AM   FREET4 0.66 10/21/2018 01:29 PM       Latest Ref Rng & Units 08/24/2021    2:16 PM 07/15/2021    3:16  PM 07/03/2021    6:14 AM  CBC  WBC 3.4 - 10.8 x10E3/uL 7.5  9.0  10.4   Hemoglobin 13.0 - 17.7 g/dL 11.0  10.7  10.5   Hematocrit 37.5 - 51.0 % 34.1  33.1  33.4   Platelets 150 - 450 x10E3/uL 250  310  377     No results found for: "VD25OH"  Clinical ASCVD: Yes  The ASCVD Risk score (Arnett DK, et al., 2019) failed to calculate for the following reasons:   The 2019 ASCVD risk score is only valid for ages 11 to 85   The patient has a prior MI or stroke diagnosis       07/06/2021    1:10 PM 06/08/2021    3:04 PM 05/24/2021    1:59 PM  Depression screen PHQ 2/9  Decreased Interest 1 3 0  Down, Depressed, Hopeless 1 2 0  PHQ - 2 Score 2 5 0  Altered sleeping 3 1   Tired, decreased energy 3 3   Change in appetite 0 0   Feeling bad or failure about yourself  0 0   Trouble concentrating 0 0   Moving slowly or fidgety/restless 0 0   Suicidal thoughts 0 0   PHQ-9 Score 8 9   Difficult doing work/chores Not difficult at all Extremely dIfficult     Social History   Tobacco Use  Smoking Status Former   Packs/day: 1.00   Years: 27.00   Total pack years: 27.00   Types: Cigarettes   Quit date: 04/18/1988   Years since quitting: 33.6  Smokeless Tobacco Former   Types: Chew   BP Readings from Last 3 Encounters:  10/13/21 (!) 146/84  09/30/21 133/72  09/21/21 (!) 141/96   Pulse Readings from Last 3 Encounters:  10/13/21 65  09/30/21 61  09/21/21 63   Wt Readings from Last 3 Encounters:  09/27/21 203 lb (92.1 kg)  09/21/21 203 lb (92.1 kg)  08/23/21 206 lb (93.4 kg)   BMI Readings from Last 3 Encounters:  09/27/21 30.87 kg/m  09/21/21 30.87 kg/m  08/23/21 31.32 kg/m    Assessment/Interventions: Review of patient past medical history, allergies, medications, health status, including review of consultants reports, laboratory and other test data, was performed as part of comprehensive evaluation and provision of chronic care management services.   SDOH:  (Social  Determinants of Health) assessments and interventions performed: Yes     SDOH Screenings   Alcohol Screen: Low Risk  (05/24/2021)   Alcohol Screen    Last Alcohol Screening Score (AUDIT): 4  Depression (PHQ2-9): Medium Risk (07/06/2021)   Depression (PHQ2-9)    PHQ-2 Score: 8  Financial Resource Strain: Low Risk  (07/06/2021)   Overall Financial Resource Strain (CARDIA)    Difficulty of Paying Living Expenses: Not hard at all  Food Insecurity: No Food Insecurity (05/24/2021)  Hunger Vital Sign    Worried About Running Out of Food in the Last Year: Never true    Ran Out of Food in the Last Year: Never true  Housing: Low Risk  (05/24/2021)   Housing    Last Housing Risk Score: 0  Physical Activity: Inactive (05/24/2021)   Exercise Vital Sign    Days of Exercise per Week: 0 days    Minutes of Exercise per Session: 0 min  Social Connections: Moderately Integrated (05/24/2021)   Social Connection and Isolation Panel [NHANES]    Frequency of Communication with Friends and Family: More than three times a week    Frequency of Social Gatherings with Friends and Family: Once a week    Attends Religious Services: More than 4 times per year    Active Member of Clubs or Organizations: No    Attends Archivist Meetings: Never    Marital Status: Married  Stress: No Stress Concern Present (05/24/2021)   [employer] [place]    Feeling of Stress : Only a little  Tobacco Use: Medium Risk (10/13/2021)   Patient History    Smoking Tobacco Use: Former    Smokeless Tobacco Use: Former    Passive Exposure: Not on Pensions consultant Needs: No Transportation Needs (05/24/2021)   PRAPARE - Hydrologist (Medical): No    Lack of Transportation (Non-Medical): No    CCM Care Plan  Allergies  Allergen Reactions   Formaldehyde Rash    From shoes made with this material    Medications Reviewed Today     Reviewed  by [name], [facility] (Pharmacist) on 11/29/21 at 1120  Med List Status: <None>   Medication Order Taking? Sig Documenting Provider Last Dose Status Informant  aspirin 81 MG chewable tablet [id number] Yes Chew 1 tablet (81 mg total) by mouth 2 (two) times daily. Carlynn Spry, PA-C Taking Active   atorvastatin (LIPITOR) 40 MG tablet [id number] Yes Take 1 tablet (40 mg total) by mouth daily. Gwyneth Sprout, FNP Taking Active   docusate sodium (COLACE) 100 MG capsule [id number] Yes Take 1 capsule (100 mg total) by mouth 2 (two) times daily.  Patient taking differently: Take 200 mg by mouth daily.   Carlynn Spry, PA-C Taking Active   fluticasone (FLONASE) 50 MCG/ACT nasal spray [id number] Yes PLACE 1 SPRAY INTO BOTH NOSTRILS DAILY AS NEEDED FOR ALLERGIES OR RHINITIS. [name], MD Taking Active   hydrocortisone 2.5 % cream [id number] Yes APPLY TO AFFECTED AREA TWICE A DAY AS NEEDED FOR RASH Bacigalupo, Dionne Bucy, MD Taking Active Self  lisinopril-hydrochlorothiazide (ZESTORETIC) 20-25 MG tablet [id number] Yes Take 1 tablet by mouth daily. Gwyneth Sprout, FNP Taking Active   meclizine (ANTIVERT) 25 MG tablet [id number] Yes Take 25 mg by mouth daily as needed (Vertigo). [provider] Taking Active Self  metoprolol tartrate (LOPRESSOR) 100 MG tablet [id number] Yes Take 1 tablet (100 mg total) by mouth 2 (two) times daily. Gwyneth Sprout, FNP Taking Active   Multiple Vitamin (MULTIVITAMIN WITH MINERALS) TABS tablet [id number] Yes Take 1 tablet by mouth daily. Vaughan Basta, MD Taking Active Self  pantoprazole (PROTONIX) 40 MG tablet [id number] Yes Take 1 tablet (40 mg total) by mouth 2 (two) times daily. Mardene Speak, PA-C Taking Active   sertraline (ZOLOFT) 50 MG tablet [id number] Yes Take 1 tablet (50 mg total) by mouth daily. Mecum, Dani Gobble, PA-C Taking Active Self  vitamin B-12 (  CYANOCOBALAMIN) 1000 MCG tablet [id number] Yes Take 1,000 mcg by mouth daily. [provider] Taking Active Self            Patient Active Problem List   Diagnosis Date Noted   Chronic bronchitis ([place]) 10/13/2021   History of total hip replacement, right 09/27/2021   Preop cardiovascular exam 07/23/2021   Persistent atrial fibrillation ([place])    Atrial fibrillation with rapid ventricular response ([place])    Acute clinical systolic heart failure ([place]) 06/26/2021   Left sided colitis ([place]) 06/23/2021   Actinic keratosis 05/24/2021   Squamous cell cancer of skin of left forearm 05/24/2021   Chronic gastric ulcer with hemorrhage 02/25/2021   Irregular Z line of esophagus 02/25/2021   Chronic pansinusitis 01/26/2021   Dizziness 01/26/2021   Chronic cough 01/26/2021   Hospital discharge follow-up 01/26/2021   Melena 01/13/2021   Acute blood loss anemia 01/13/2021   Alcohol use disorder, moderate, dependence ([place]) 01/13/2021   Stage 3a chronic kidney disease ([place]) 11/23/2020   Chronic pain of right ankle 05/21/2020   Encounter for attention to colostomy ([place]) 05/21/2020   Achilles tendinitis 04/30/2020   Synovitis and tenosynovitis 04/30/2020   Lymphedema 03/03/2020   Chronic venous insufficiency 03/03/2020   Fatigue 02/14/2020   Leg edema 02/14/2020   Foraminal stenosis of lumbar region 11/08/2019   Chronic bilateral low back pain without sciatica 10/18/2019   Foraminal stenosis of cervical region 10/18/2019   PSVT (paroxysmal supraventricular tachycardia) ([place]) 08/01/2019   Lacunar infarction ([place]) 07/10/2019   History of stroke 07/01/2019   Cerebrovascular accident (CVA) ([place]) 06/08/2019   Paroxysmal atrial fibrillation ([place]) 11/07/2018   TIA (transient ischemic attack) 10/20/2018   History of subarachnoid hemorrhage 10/16/2018   Low libido 08/30/2018   Obesity 05/14/2018   Hyperlipidemia LDL goal <70 05/14/2018   Degeneration of lumbar intervertebral disc 03/22/2018   Osteoarthritis of hip 03/22/2018   Trochanteric bursitis of right hip 03/22/2018    History of colostomy reversal 03/21/2017   Status post partial colectomy 01/03/2017   Depression, major, single episode, mild ([place]) 01/03/2017   Vasovagal syncope 01/27/2016   History of skin cancer 10/28/2015   Osteoarthritis of right knee 10/01/2015   Essential hypertension 08/25/2015   Dyspnea on exertion 08/12/2015   Erectile dysfunction following radiation therapy 08/04/2015   Constipation 08/02/2015   History of prostate cancer 01/20/2015   Swelling of right lower extremity 01/20/2015    Immunization History  Administered Date(s) Administered   Fluad Quad(high Dose 65+) 01/14/2020, 01/26/2021   Influenza, High Dose Seasonal PF 01/26/2017, 02/01/2018, 01/31/2019   Influenza,inj,Quad PF,6+ Mos 02/08/2016   Influenza,inj,quad, With Preservative 12/18/2014   PFIZER Comirnaty(Gray Top)Covid-19 Tri-Sucrose Vaccine 08/24/2020   PFIZER(Purple Top)SARS-COV-2 Vaccination 05/31/2019, 06/25/2019, 01/22/2020   Pneumococcal Conjugate-13 05/14/2018   Pneumococcal Polysaccharide-23 12/18/2014   Pneumococcal-Unspecified 12/18/2014   Tdap 01/14/2020    Conditions to be addressed/monitored:  Hypertension, Hyperlipidemia, Atrial Fibrillation, Chronic Kidney Disease, Depression, Osteoarthritis, and History of TIA, CVA   Care Plan : General Pharmacy (Adult)  Updates made by [name], RPH since 12/16/2021 12:00 AM     Problem: Hypertension, Hyperlipidemia, Atrial Fibrillation, Chronic Kidney Disease, Depression, Osteoarthritis, and History of TIA, CVA   Priority: High     Long-Range Goal: Patient-Specific Goal   Start Date: 02/25/2021  Expected End Date: 02/25/2022  This Visit's Progress: On track  Recent Progress: On track  Priority: High  Note:   Current Barriers:  Unable to self administer medications as prescribed  Pharmacist  Clinical Goal(s):  Patient will maintain control of blood pressure as evidenced by BP less than 140/90  through collaboration with PharmD and  provider.   Interventions: 1:1 collaboration with [name], MD regarding development and update of comprehensive plan of care as evidenced by provider attestation and co-signature Inter-disciplinary care team collaboration (see longitudinal plan of care) Comprehensive medication review performed; medication list updated in electronic medical record  Hypertension (BP goal <140/90) -Controlled -Current treatment: Metoprolol tartrate 100 mg twice daily: Appropriate, Effective, Safe, Accessible  Lisinopril-HCTZ 20-25 mg daily: Appropriate, Effective, Safe, Accessible  -Medications previously tried: Atenolol, Bisoprolol, nebivolol, amlodipine, lisinopril  Lisinopril recently started, patient tolerating well but has not been monitoring. Feels headaches have improved.  -Current home readings: 130/85 -Current dietary habits: Eats what I want to eat. 1 cup of coffee. Ocassional sweet tea or regular soda. Eats out large meal once weekly. Does salt food.  -Current exercise habits: None -Denies hypotensive/hypertensive symptoms, but does not significant fatigue throughout the day.  -Advised patient to resume monitoring blood pressure, recheck BMP in one week.  -Recommended to continue current medication  Atrial Fibrillation (Goal: prevent stroke and major bleeding) -Controlled -CHADSVASC: 7  -Current treatment: Rate control:  Metoprolol 100 mg twice daily  Anticoagulation: Aspirin 81 mg twice daily  -Medications previously tried: NA -Recommended to continue current medication   Hyperlipidemia: (LDL goal < 70) -Uncontrolled -History of TIA, CVA  -Current treatment: Atorvastatin 40 mg daily (AM) -Current treatment: Aspirin 81 mg twice daily (AM) -Medications previously tried: NA  -Recommended to continue current medication  Depression (Goal: Maintain symptom remission) -Controlled -Current treatment: Sertraline 50 mg daily  -Medications previously tried/failed: NA -PHQ9:  3 -Recommended to continue current medication  GERD (Goal: Prevent Reflux) -Controlled History of stomach ulcer  -Current treatment  Pantoprazole 40 mg twice daily: Query Appropriate  -Medications previously tried: NA  -Query appropriate use of twice daily pantoprazole, defer potential changes for now.  -Recommended to continue current medication  Chronic Kidney Disease Stage 3a  -All medications assessed for renal dosing and appropriateness in chronic kidney disease. -Recommended to continue current medication  Tobacco use (Goal Quit smoking) -Uncontrolled -Previous quit attempts: NA -Current treatment  Tobacco Dip 3-4 daily ( equivalent 34 mg nicotine or 1 ppd)  -Patient smokes After 30 minutes of waking -Patient triggers include: finishing a meal -Recommended to continue current medication   Patient Goals/Self-Care Activities Patient will:  - check blood pressure weekly, document, and provide at future appointments  Follow Up Plan: Telephone follow up appointment with care management team member scheduled for:  12/21/2021 at 10:00 AM      Medication Assistance: None required.  Patient affirms current coverage meets needs.  Compliance/Adherence/Medication fill history: Care Gaps: Shingrix Vaccine Pneumonia Vaccine COVID-19 Vaccine  Star-Rating Drugs: Atorvastatin 40 mg last filled 07/06/21 [facility]. Lisinopril 10 mg daily last filled 08/12/21 for 90-DS  [facility]  [id number] [place]Phone: [phone]: [phone]  Uses pill box? Yes Pt endorses 100% compliance  We discussed: Current pharmacy is preferred with insurance plan and patient is satisfied with pharmacy services Patient decided to: Continue current medication management strategy  Care Plan and Follow Up Patient Decision:  Patient agrees to Care Plan and  Follow-up.  Plan: Telephone follow up appointment with care management team member scheduled for:  12/21/2021 at 10:00 AM  AJunius Argyle PharmD, BCACP,  [place] [phone]

## 2021-11-30 ENCOUNTER — Other Ambulatory Visit: Payer: Self-pay | Admitting: Family Medicine

## 2021-11-30 NOTE — Telephone Encounter (Signed)
Requested medication (s) are due for refill today: yes  Requested medication (s) are on the active medication list: yes  Last refill:  10/22/20 28 g 12 RF  Future visit scheduled: no  Notes to clinic:  med not assigned to a protocol   Requested Prescriptions  Pending Prescriptions Disp Refills   hydrocortisone 2.5 % cream [Pharmacy Med Name: HYDROCORTISONE 2.5% CREAM] 28 g 12    Sig: APPLY TO AFFECTED AREA TWICE A DAY AS NEEDED FOR RASH     Off-Protocol Failed - 11/30/2021 12:09 PM      Failed - Medication not assigned to a protocol, review manually.      Passed - Valid encounter within last 12 months    Recent Outpatient Visits           1 month ago Essential hypertension   [employer], [place], PA-C   4 months ago Essential hypertension   [facility] [place], Dionne Bucy, MD   4 months ago Depression, major, single episode, mild [facility])   [employer], Erin E, PA-C   5 months ago Acute recurrent sinusitis, unspecified location   [employer], Dani Gobble, PA-C   10 months ago Hospital discharge follow-up   [facility] Gwyneth Sprout, FNP       Future Appointments             In 2 months Dunn, Areta Haber, PA-C [facility]   In 3 months [name], MD [place]

## 2021-12-09 ENCOUNTER — Telehealth: Payer: Self-pay

## 2021-12-09 NOTE — Progress Notes (Signed)
Patient states he has questions regarding his Flonase, saying when  he was in rehab he was given a Flonase propionate 50 MCG/ACT which seems to work better then what he is prescribe. Patient is asking if the Clinical pharmacist know what the difference is between the two, and if he can get the same Flonase nasal spray that was given to him from Rehab . Notified Clinical pharmacist.  Per Clinical Pharmacist, both bottles contain the same medicine so I am not sure why one might work better than the other. Please review proper technique with him:   Gently blow your nose to clear your nostrils. Place the tip of the nozzle in one nostril and close the other nostril with your finger. Aim slightly away from the center of your nose, press the [place] nozzle and sniff the mist in gently. Be careful not to spray into your eyes! Exhale through your mouth. Repeat if your dosage calls for two sprays. Repeat the entire process in the other nostril. Wipe the nozzle clean with a tissue and replace the green cap.  Patient verbalized understanding and states he knows the proper technique to use.  [place] Pharmacist Assistant [phone]

## 2021-12-16 DIAGNOSIS — I4891 Unspecified atrial fibrillation: Secondary | ICD-10-CM | POA: Diagnosis not present

## 2021-12-16 DIAGNOSIS — E785 Hyperlipidemia, unspecified: Secondary | ICD-10-CM | POA: Diagnosis not present

## 2021-12-16 DIAGNOSIS — N1831 Chronic kidney disease, stage 3a: Secondary | ICD-10-CM | POA: Diagnosis not present

## 2021-12-16 DIAGNOSIS — F17210 Nicotine dependence, cigarettes, uncomplicated: Secondary | ICD-10-CM | POA: Diagnosis not present

## 2021-12-16 DIAGNOSIS — I129 Hypertensive chronic kidney disease with stage 1 through stage 4 chronic kidney disease, or unspecified chronic kidney disease: Secondary | ICD-10-CM

## 2021-12-16 NOTE — Patient Instructions (Signed)
Visit Information It was great speaking with you today!  Please let me know if you have any questions about our visit.   Goals Addressed             This Visit's Progress    Manage My Medicine   On track    Timeframe:  Long-Range Goal Priority:  High Start Date: 02/25/2021                            Expected End Date: 02/25/2022                      Follow Up within 90 days   - call for medicine refill 2 or 3 days before it runs out - keep a list of all the medicines I take; vitamins and herbals too - use a pillbox to sort medicine    Why is this important?   These steps will help you keep on track with your medicines.   Notes:         Patient Care Plan: General Pharmacy (Adult)     Problem Identified: Hypertension, Hyperlipidemia, Atrial Fibrillation, Chronic Kidney Disease, Depression, Osteoarthritis, and History of TIA, CVA   Priority: High     Long-Range Goal: Patient-Specific Goal   Start Date: 02/25/2021  Expected End Date: 02/25/2022  This Visit's Progress: On track  Recent Progress: On track  Priority: High  Note:   Current Barriers:  Unable to self administer medications as prescribed  Pharmacist Clinical Goal(s):  Patient will maintain control of blood pressure as evidenced by BP less than 140/90  through collaboration with PharmD and provider.   Interventions: 1:1 collaboration with [name], MD regarding development and update of comprehensive plan of care as evidenced by provider attestation and co-signature Inter-disciplinary care team collaboration (see longitudinal plan of care) Comprehensive medication review performed; medication list updated in electronic medical record  Hypertension (BP goal <140/90) -Controlled -Current treatment: Metoprolol tartrate 100 mg twice daily: Appropriate, Effective, Safe, Accessible  Lisinopril-HCTZ 20-25 mg daily: Appropriate, Effective, Safe, Accessible  -Medications previously tried: Atenolol,  Bisoprolol, nebivolol, amlodipine, lisinopril  Lisinopril recently started, patient tolerating well but has not been monitoring. Feels headaches have improved.  -Current home readings: 130/85 -Current dietary habits: Eats what I want to eat. 1 cup of coffee. Ocassional sweet tea or regular soda. Eats out large meal once weekly. Does salt food.  -Current exercise habits: None -Denies hypotensive/hypertensive symptoms, but does not significant fatigue throughout the day.  -Advised patient to resume monitoring blood pressure, recheck BMP in one week.  -Recommended to continue current medication  Atrial Fibrillation (Goal: prevent stroke and major bleeding) -Controlled -CHADSVASC: 7  -Current treatment: Rate control:  Metoprolol 100 mg twice daily  Anticoagulation: Aspirin 81 mg twice daily  -Medications previously tried: NA -Recommended to continue current medication   Hyperlipidemia: (LDL goal < 70) -Uncontrolled -History of TIA, CVA  -Current treatment: Atorvastatin 40 mg daily (AM) -Current treatment: Aspirin 81 mg twice daily (AM) -Medications previously tried: NA  -Recommended to continue current medication  Depression (Goal: Maintain symptom remission) -Controlled -Current treatment: Sertraline 50 mg daily  -Medications previously tried/failed: NA -PHQ9: 3 -Recommended to continue current medication  GERD (Goal: Prevent Reflux) -Controlled History of stomach ulcer  -Current treatment  Pantoprazole 40 mg twice daily: Query Appropriate  -Medications previously tried: NA  -Query appropriate use of twice daily pantoprazole, defer potential changes for now.  -  Recommended to continue current medication  Chronic Kidney Disease Stage 3a  -All medications assessed for renal dosing and appropriateness in chronic kidney disease. -Recommended to continue current medication  Tobacco use (Goal Quit smoking) -Uncontrolled -Previous quit attempts: NA -Current treatment   Tobacco Dip 3-4 daily ( equivalent 34 mg nicotine or 1 ppd)  -Patient smokes After 30 minutes of waking -Patient triggers include: finishing a meal -Recommended to continue current medication   Patient Goals/Self-Care Activities Patient will:  - check blood pressure weekly, document, and provide at future appointments  Follow Up Plan: Telephone follow up appointment with care management team member scheduled for:  12/21/2021 at 10:00 AM      Patient agreed to services and verbal consent obtained.   Patient verbalizes understanding of instructions and care plan provided today and agrees to view in [place]. Active MyChart status and patient understanding of how to access instructions and care plan via MyChart confirmed with patient.     Junius Argyle, PharmD, Para [place], CPP  Clinical Pharmacist Practitioner  [facility] [phone]

## 2021-12-17 ENCOUNTER — Telehealth: Payer: Self-pay

## 2021-12-17 NOTE — Progress Notes (Signed)
Chronic Care Management APPOINTMENT REMINDER   Cameron Foster. was reminded to have all medications, supplements and any blood glucose and blood pressure readings available for review with Junius Argyle, Pharm. D, at his telephone visit on 12/21/2021 at 11:00 am.  Patient confirm appointment.  [place] Pharmacist Assistant [phone]

## 2021-12-21 ENCOUNTER — Ambulatory Visit (INDEPENDENT_AMBULATORY_CARE_PROVIDER_SITE_OTHER): Payer: Medicare Other

## 2021-12-21 DIAGNOSIS — I10 Essential (primary) hypertension: Secondary | ICD-10-CM

## 2021-12-21 DIAGNOSIS — J324 Chronic pansinusitis: Secondary | ICD-10-CM

## 2021-12-21 DIAGNOSIS — Z720 Tobacco use: Secondary | ICD-10-CM

## 2021-12-21 MED ORDER — BUPROPION HCL ER (SR) 150 MG PO TB12: ORAL_TABLET | ORAL | 0 refills | Status: DC

## 2021-12-21 MED ORDER — BUPROPION HCL ER (SR) 150 MG PO TB12: 150.0000 mg | ORAL_TABLET | Freq: Two times a day (BID) | ORAL | 1 refills | Status: DC

## 2021-12-21 NOTE — Progress Notes (Signed)
Chronic Care Management Pharmacy Note  12/21/2021 Name:  Cameron Foster. MRN:  [id number] DOB:  Jul 01, 1940  Summary: Patient presents for CCM follow-up.   -Patient with worsening sinus symptoms. No relief with Flonase.   -Patient continues to use tobacco products, but is down to using dip once daily. He is motivated to quit and patient set quit date of 01/16/22.   Recommendations/Changes made from today's visit: -Recommend trial of saline nasal spray + cetirizine 10 mg daily for 30 days.   Provided contact information for [place] Quit Line (1-800-QUIT-NOW) and encouraged patient to reach out to this group for support. -START Wellbutrin SR 150 mg daily for 3 days, then INCREASE to 150 mg twice daily  -START Nicotine lozenge 2 mg as needed for breakthrough symptoms.   Plan: CPP follow-up 1 month  Subjective: Cameron Foster. is an 81 y.o. year old male who is a primary patient of Bacigalupo, Dionne Bucy, MD.  The CCM team was consulted for assistance with disease management and care coordination needs.    Engaged with patient by telephone for follow up visit in response to provider referral for pharmacy case management and/or care coordination services.   Consent to Services:  The patient was given information about Chronic Care Management services, agreed to services, and gave verbal consent prior to initiation of services.  Please see initial visit note for detailed documentation.   Patient Care Team: [name], MD as PCP - General (Family Medicine) End, Harrell Gave, MD as PCP - Cardiology (Cardiology) End, Harrell Gave, MD as Consulting Physician (Cardiology) [name], MD (Orthopedic Surgery) Schnier, Dolores Lory, MD (Vascular Surgery) [name], MD (Dermatology) [name], MD (Inactive) as Consulting Physician (Ophthalmology) [name], [facility] (Pharmacist)  Recent office visits:  07/09/21: patient presented to [name] for hospital follow-up.   07/06/21: Patient presented to [facility], PA-C for follow-up. Docusate, hydrocortisone, melatonine, Miralax, thiamine stopped.   Recent consult visits: 08/17/21: Dr. Saunders Revel recommended stopping Eliquis, starting Aspirin 81 mg daily.  08/12/21: Patient presented to Dr. Deloria Lair for headache. BP 190/113. Lisinopril 10 mg daily.  07/22/21: Patient presented to Dr. Saunders Revel for follow-up. Srop amiodarone.   Hospital visits: 06/22/21-07/03/21: Hospitalized due to colitis. Abx 3/8-3/14. Eliquis 5 mg twice daily. Amiodarone 200 mg twice daily. Stop amlodipine, furosemide, lisinopril,tramadol.   Admitted to the hospital on 01/13/2021 due to Melena. Discharge date was 01/14/2021. Discharged from [facility]?Medications Started at [facility] Discharge:?? -started None   Medication Changes at Hospital Discharge: -Changed none   Medications Discontinued at Hospital Discharge: -Stopped meloxicam ,stop omeprazole   Medications that remain the same after Hospital Discharge:??  -All other medications will remain the same.     Objective:  Lab Results  Component Value Date   CREATININE 1.37 (H) 08/24/2021   BUN 14 08/24/2021   GFR 57.85 (L) 02/21/2018   GFRNONAA 45 (L) 07/03/2021   GFRAA 62 08/15/2019   NA 140 08/24/2021   K 4.6 08/24/2021   CALCIUM 9.4 08/24/2021   CO2 27 08/24/2021   GLUCOSE 103 (H) 08/24/2021    Lab Results  Component Value Date/Time   HGBA1C 5.4 09/24/2020 03:12 PM   HGBA1C 5.6 06/09/2019 05:27 AM   HGBA1C 5.3 10/11/2018 12:00 AM   GFR 57.85 (L) 02/21/2018 11:56 AM   GFR 61.28 04/19/2017 03:50 PM    Last diabetic Eye exam: No results found for: "HMDIABEYEEXA"  Last diabetic Foot exam: No results found for: "  HMDIABFOOTEX"   Lab Results  Component Value Date   CHOL 156 08/15/2019   HDL 46 08/15/2019   LDLCALC 85 08/15/2019   TRIG 144 08/15/2019   CHOLHDL 4.4 06/09/2019       Latest Ref Rng & Units 08/24/2021    2:16 PM 07/15/2021    3:16 PM  06/21/2021    8:35 PM  Hepatic Function  Total Protein 6.0 - 8.5 g/dL 6.9  6.4  7.6   Albumin 3.7 - 4.7 g/dL 4.0  3.7  3.9   AST 0 - 40 IU/L 22  18  26    ALT 0 - 44 IU/L 12  12  13    Alk Phosphatase 44 - 121 IU/L 71  80  79   Total Bilirubin 0.0 - 1.2 mg/dL 0.3  0.3  0.7     Lab Results  Component Value Date/Time   TSH 3.077 06/28/2021 04:26 AM   TSH 5.605 (H) 10/20/2018 09:46 PM   TSH 1.88 01/20/2015 11:22 AM   FREET4 0.66 10/21/2018 01:29 PM       Latest Ref Rng & Units 08/24/2021    2:16 PM 07/15/2021    3:16 PM 07/03/2021    6:14 AM  CBC  WBC 3.4 - 10.8 x10E3/uL 7.5  9.0  10.4   Hemoglobin 13.0 - 17.7 g/dL 11.0  10.7  10.5   Hematocrit 37.5 - 51.0 % 34.1  33.1  33.4   Platelets 150 - 450 x10E3/uL 250  310  377     No results found for: "VD25OH"  Clinical ASCVD: Yes  The ASCVD Risk score (Arnett DK, et al., 2019) failed to calculate for the following reasons:   The 2019 ASCVD risk score is only valid for ages 56 to 2   The patient has a prior MI or stroke diagnosis       07/06/2021    1:10 PM 06/08/2021    3:04 PM 05/24/2021    1:59 PM  Depression screen PHQ 2/9  Decreased Interest 1 3 0  Down, Depressed, Hopeless 1 2 0  PHQ - 2 Score 2 5 0  Altered sleeping 3 1   Tired, decreased energy 3 3   Change in appetite 0 0   Feeling bad or failure about yourself  0 0   Trouble concentrating 0 0   Moving slowly or fidgety/restless 0 0   Suicidal thoughts 0 0   PHQ-9 Score 8 9   Difficult doing work/chores Not difficult at all Extremely dIfficult     Social History   Tobacco Use  Smoking Status Former   Packs/day: 1.00   Years: 27.00   Total pack years: 27.00   Types: Cigarettes   Quit date: 04/18/1988   Years since quitting: 33.6  Smokeless Tobacco Former   Types: Chew   BP Readings from Last 3 Encounters:  10/13/21 (!) 146/84  09/30/21 133/72  09/21/21 (!) 141/96   Pulse Readings from Last 3 Encounters:  10/13/21 65  09/30/21 61  09/21/21 63   Wt  Readings from Last 3 Encounters:  09/27/21 203 lb (92.1 kg)  09/21/21 203 lb (92.1 kg)  08/23/21 206 lb (93.4 kg)   BMI Readings from Last 3 Encounters:  09/27/21 30.87 kg/m  09/21/21 30.87 kg/m  08/23/21 31.32 kg/m    Assessment/Interventions: Review of patient past medical history, allergies, medications, health status, including review of consultants reports, laboratory and other test data, was performed as part of comprehensive evaluation and provision of chronic  care management services.   SDOH:  (Social Determinants of Health) assessments and interventions performed: Yes SDOH Interventions    Flowsheet Row Video Visit from 07/06/2021 in [facility] Most recent reading at 07/06/2021  1:10 PM Chronic Care Management from 07/06/2021 in [facility] Most recent reading at 07/06/2021 11:48 AM Chronic Care Management from 05/31/2021 in [facility] Most recent reading at 06/16/2021 10:13 AM Office Visit from 06/08/2021 in [facility] Most recent reading at 06/08/2021  3:04 PM Clinical Support from 05/24/2021 in [facility] Most recent reading at 05/24/2021  2:00 PM Chronic Care Management from 02/23/2021 in [facility] Most recent reading at 02/25/2021 11:43 AM  SDOH Interventions        Food Insecurity Interventions -- -- -- -- Intervention Not Indicated --  Housing Interventions -- -- -- -- Intervention Not Indicated --  Transportation Interventions -- -- -- -- Intervention Not Indicated --  Depression Interventions/Treatment  Medication -- -- Currently on Treatment -- --  Financial Strain Interventions -- Intervention Not Indicated Intervention Not Indicated -- Intervention Not Indicated Intervention Not Indicated  Physical Activity Interventions -- -- -- -- Patient Refused --  Stress Interventions -- -- -- -- Intervention Not Indicated --  Social Connections Interventions -- -- -- -- Intervention Not  Indicated --         SDOH Screenings   Food Insecurity: No Food Insecurity (05/24/2021)  Housing: Low Risk  (05/24/2021)  Transportation Needs: No Transportation Needs (05/24/2021)  Alcohol Screen: Low Risk  (05/24/2021)  Depression (PHQ2-9): Medium Risk (07/06/2021)  Financial Resource Strain: Low Risk  (07/06/2021)  Physical Activity: Inactive (05/24/2021)  Social Connections: Moderately Integrated (05/24/2021)  Stress: No Stress Concern Present (05/24/2021)  Tobacco Use: Medium Risk (10/13/2021)    CCM Care Plan  Allergies  Allergen Reactions   Formaldehyde Rash    From shoes made with this material    Medications Reviewed Today     Reviewed by [name], [facility] (Pharmacist) on 11/29/21 at 1120  Med List Status: <None>   Medication Order Taking? Sig Documenting Provider Last Dose Status Informant  aspirin 81 MG chewable tablet [id number] Yes Chew 1 tablet (81 mg total) by mouth 2 (two) times daily. Carlynn Spry, PA-C Taking Active   atorvastatin (LIPITOR) 40 MG tablet [id number] Yes Take 1 tablet (40 mg total) by mouth daily. Gwyneth Sprout, FNP Taking Active   docusate sodium (COLACE) 100 MG capsule [id number] Yes Take 1 capsule (100 mg total) by mouth 2 (two) times daily.  Patient taking differently: Take 200 mg by mouth daily.   Carlynn Spry, PA-C Taking Active   fluticasone (FLONASE) 50 MCG/ACT nasal spray [id number] Yes PLACE 1 SPRAY INTO BOTH NOSTRILS DAILY AS NEEDED FOR ALLERGIES OR RHINITIS. [name], MD Taking Active   hydrocortisone 2.5 % cream [id number] Yes APPLY TO AFFECTED AREA TWICE A DAY AS NEEDED FOR RASH Bacigalupo, Dionne Bucy, MD Taking Active Self  lisinopril-hydrochlorothiazide (ZESTORETIC) 20-25 MG tablet [id number] Yes Take 1 tablet by mouth daily. Gwyneth Sprout, FNP Taking Active   meclizine (ANTIVERT) 25 MG tablet [id number] Yes Take 25 mg by mouth daily as needed (Vertigo). [provider] Taking Active Self  metoprolol tartrate  (LOPRESSOR) 100 MG tablet [id number] Yes Take 1 tablet (100 mg total) by mouth 2 (two) times daily. Gwyneth Sprout, FNP Taking Active   Multiple Vitamin (MULTIVITAMIN WITH MINERALS) TABS tablet [id number] Yes Take 1 tablet by mouth daily. Vaughan Basta, MD  Taking Active Self  pantoprazole (PROTONIX) 40 MG tablet [id number] Yes Take 1 tablet (40 mg total) by mouth 2 (two) times daily. Mardene Speak, PA-C Taking Active   sertraline (ZOLOFT) 50 MG tablet [id number] Yes Take 1 tablet (50 mg total) by mouth daily. Mecum, Dani Gobble, PA-C Taking Active Self  vitamin B-12 (CYANOCOBALAMIN) 1000 MCG tablet [id number] Yes Take 1,000 mcg by mouth daily. [provider] Taking Active Self            Patient Active Problem List   Diagnosis Date Noted   Tobacco abuse 12/21/2021   Chronic bronchitis ([place]) 10/13/2021   History of total hip replacement, right 09/27/2021   Preop cardiovascular exam 07/23/2021   Persistent atrial fibrillation ([place])    Atrial fibrillation with rapid ventricular response ([place])    Acute clinical systolic heart failure ([place]) 06/26/2021   Left sided colitis ([place]) 06/23/2021   Actinic keratosis 05/24/2021   Squamous cell cancer of skin of left forearm 05/24/2021   Chronic gastric ulcer with hemorrhage 02/25/2021   Irregular Z line of esophagus 02/25/2021   Chronic pansinusitis 01/26/2021   Dizziness 01/26/2021   Chronic cough 01/26/2021   Hospital discharge follow-up 01/26/2021   Melena 01/13/2021   Acute blood loss anemia 01/13/2021   Alcohol use disorder, moderate, dependence ([place]) 01/13/2021   Stage 3a chronic kidney disease ([place]) 11/23/2020   Chronic pain of right ankle 05/21/2020   Encounter for attention to colostomy ([place]) 05/21/2020   Achilles tendinitis 04/30/2020   Synovitis and tenosynovitis 04/30/2020   Lymphedema 03/03/2020   Chronic venous insufficiency 03/03/2020   Fatigue 02/14/2020   Leg edema 02/14/2020   Foraminal stenosis of lumbar  region 11/08/2019   Chronic bilateral low back pain without sciatica 10/18/2019   Foraminal stenosis of cervical region 10/18/2019   PSVT (paroxysmal supraventricular tachycardia) ([place]) 08/01/2019   Lacunar infarction ([place]) 07/10/2019   History of stroke 07/01/2019   Cerebrovascular accident (CVA) ([place]) 06/08/2019   Paroxysmal atrial fibrillation ([place]) 11/07/2018   TIA (transient ischemic attack) 10/20/2018   History of subarachnoid hemorrhage 10/16/2018   Low libido 08/30/2018   Obesity 05/14/2018   Hyperlipidemia LDL goal <70 05/14/2018   Degeneration of lumbar intervertebral disc 03/22/2018   Osteoarthritis of hip 03/22/2018   Trochanteric bursitis of right hip 03/22/2018   History of colostomy reversal 03/21/2017   Status post partial colectomy 01/03/2017   Depression, major, single episode, mild ([place]) 01/03/2017   Vasovagal syncope 01/27/2016   History of skin cancer 10/28/2015   Osteoarthritis of right knee 10/01/2015   Essential hypertension 08/25/2015   Dyspnea on exertion 08/12/2015   Erectile dysfunction following radiation therapy 08/04/2015   Constipation 08/02/2015   History of prostate cancer 01/20/2015   Swelling of right lower extremity 01/20/2015    Immunization History  Administered Date(s) Administered   Fluad Quad(high Dose 65+) 01/14/2020, 01/26/2021   Influenza, High Dose Seasonal PF 01/26/2017, 02/01/2018, 01/31/2019   Influenza,inj,Quad PF,6+ Mos 02/08/2016   Influenza,inj,quad, With Preservative 12/18/2014   PFIZER Comirnaty(Gray Top)Covid-19 Tri-Sucrose Vaccine 08/24/2020   PFIZER(Purple Top)SARS-COV-2 Vaccination 05/31/2019, 06/25/2019, 01/22/2020   Pneumococcal Conjugate-13 05/14/2018   Pneumococcal Polysaccharide-23 12/18/2014   Pneumococcal-Unspecified 12/18/2014   Tdap 01/14/2020    Conditions to be addressed/monitored:  Hypertension, Hyperlipidemia, Atrial Fibrillation, Chronic Kidney Disease, Depression, Osteoarthritis, and History of TIA,  CVA   Care Plan : General Pharmacy (Adult)  Updates made by [name], [place] since 12/21/2021 12:00 AM     Problem: Hypertension, Hyperlipidemia, Atrial Fibrillation, Chronic  Kidney Disease, Depression, Osteoarthritis, and History of TIA, CVA   Priority: High     Long-Range Goal: Patient-Specific Goal   Start Date: 02/25/2021  Expected End Date: 02/25/2022  This Visit's Progress: On track  Recent Progress: On track  Priority: High  Note:   Current Barriers:  Unable to self administer medications as prescribed  Pharmacist Clinical Goal(s):  Patient will maintain control of blood pressure as evidenced by BP less than 140/90  through collaboration with PharmD and provider.   Interventions: 1:1 collaboration with [name], MD regarding development and update of comprehensive plan of care as evidenced by provider attestation and co-signature Inter-disciplinary care team collaboration (see longitudinal plan of care) Comprehensive medication review performed; medication list updated in electronic medical record  Hypertension (BP goal <140/90) -Controlled -Current treatment: Metoprolol tartrate 100 mg twice daily: Appropriate, Effective, Safe, Accessible  Lisinopril-HCTZ 20-25 mg daily: Appropriate, Effective, Safe, Accessible  -Medications previously tried: Atenolol, Bisoprolol, nebivolol, amlodipine, lisinopril  Lisinopril recently started, patient tolerating well but has not been monitoring. Feels headaches have improved.  -Current home readings: 130/85 -Current dietary habits: Eats what I want to eat. 1 cup of coffee. Ocassional sweet tea or regular soda. Eats out large meal once weekly. Does salt food.  -Current exercise habits: None -Denies hypotensive/hypertensive symptoms, but does not significant fatigue throughout the day.  -Advised patient to resume monitoring blood pressure, recheck BMP in one week.  -Recommended to continue current medication  Atrial  Fibrillation (Goal: prevent stroke and major bleeding) -Controlled -CHADSVASC: 7  -Current treatment: Rate control:  Metoprolol 100 mg twice daily  Anticoagulation: Aspirin 81 mg twice daily  -Medications previously tried: NA -Recommended to continue current medication  Hyperlipidemia: (LDL goal < 70) -Uncontrolled -History of TIA, CVA  -Current treatment: Atorvastatin 40 mg daily (AM) -Current treatment: Aspirin 81 mg twice daily (AM) -Medications previously tried: NA  -Recommended to continue current medication  Depression (Goal: Maintain symptom remission) -Controlled -Current treatment: Sertraline 50 mg daily  -Medications previously tried/failed: NA -PHQ9: 3 -Recommended to continue current medication  GERD (Goal: Prevent Reflux) -Controlled History of stomach ulcer  -Current treatment  Pantoprazole 40 mg twice daily: Query Appropriate  -Medications previously tried: NA  -Query appropriate use of twice daily pantoprazole, defer potential changes for now.  -Recommended to continue current medication  Chronic Kidney Disease Stage 3a  -All medications assessed for renal dosing and appropriateness in chronic kidney disease. -Recommended to continue current medication  Tobacco use (Goal Quit smoking) -On track  -Previous quit attempts: NA -Current treatment  Tobacco dip once daily   -Patient smokes After 30 minutes of waking -Patient triggers include: finishing a meal Patient set quit date of 01/16/22. Counseled to allow lozenge to dissolve and absorb in cheek pocket, rather than swallow, to reduce GI side effects. Provided contact information for [place] Quit Line (1-800-QUIT-NOW) and encouraged patient to reach out to this group for support. -START Wellbutrin SR 150 mg daily for 3 days, then INCREASE to 150 mg twice daily  -START Nicotine lozenge 2 mg as needed for breakthrough symptoms.   Patient Goals/Self-Care Activities Patient will:  - check blood pressure  weekly, document, and provide at future appointments  Follow Up Plan: Telephone follow up appointment with care management team member scheduled for:  01/21/2022 at 10:00 AM    Medication Assistance: None required.  Patient affirms current coverage meets needs.  Compliance/Adherence/Medication fill history: Care Gaps: Shingrix Vaccine Pneumonia Vaccine COVID-19 Vaccine  Star-Rating Drugs: Atorvastatin 40  mg last filled 07/06/21 [facility]. Lisinopril 10 mg daily last filled 08/12/21 for 90-DS  Patient's [facility] pharmacy [facility]  [id number] [place] [id number]: [phone]: [phone] Uses pill box? Yes Pt endorses 100% compliance  We discussed: Current pharmacy is preferred with insurance plan and patient is satisfied with pharmacy services Patient decided to: Continue current medication management strategy  Care Plan and Follow Up Patient Decision:  Patient agrees to Care Plan and Follow-up.  Plan: Telephone follow up appointment with care management team member scheduled for:  01/21/2022 at 10:00 AM  AJunius Argyle PharmD, BPara March [phone]

## 2021-12-21 NOTE — Patient Instructions (Signed)
Visit Information It was great speaking with you today!  Please let me know if you have any questions about our visit.  Patient Care Plan: General Pharmacy (Adult)     Problem Identified: Hypertension, Hyperlipidemia, Atrial Fibrillation, Chronic Kidney Disease, Depression, Osteoarthritis, and History of TIA, CVA   Priority: High     Long-Range Goal: Patient-Specific Goal   Start Date: 02/25/2021  Expected End Date: 02/25/2022  This Visit's Progress: On track  Recent Progress: On track  Priority: High  Note:   Current Barriers:  Unable to self administer medications as prescribed  Pharmacist Clinical Goal(s):  Patient will maintain control of blood pressure as evidenced by BP less than 140/90  through collaboration with PharmD and provider.   Interventions: 1:1 collaboration with [name], MD regarding development and update of comprehensive plan of care as evidenced by provider attestation and co-signature Inter-disciplinary care team collaboration (see longitudinal plan of care) Comprehensive medication review performed; medication list updated in electronic medical record  Hypertension (BP goal <140/90) -Controlled -Current treatment: Metoprolol tartrate 100 mg twice daily: Appropriate, Effective, Safe, Accessible  Lisinopril-HCTZ 20-25 mg daily: Appropriate, Effective, Safe, Accessible  -Medications previously tried: Atenolol, Bisoprolol, nebivolol, amlodipine, lisinopril  Lisinopril recently started, patient tolerating well but has not been monitoring. Feels headaches have improved.  -Current home readings: 130/85 -Current dietary habits: Eats what I want to eat. 1 cup of coffee. Ocassional sweet tea or regular soda. Eats out large meal once weekly. Does salt food.  -Current exercise habits: None -Denies hypotensive/hypertensive symptoms, but does not significant fatigue throughout the day.  -Advised patient to resume monitoring blood pressure, recheck BMP in  one week.  -Recommended to continue current medication  Atrial Fibrillation (Goal: prevent stroke and major bleeding) -Controlled -CHADSVASC: 7  -Current treatment: Rate control:  Metoprolol 100 mg twice daily  Anticoagulation: Aspirin 81 mg twice daily  -Medications previously tried: NA -Recommended to continue current medication  Hyperlipidemia: (LDL goal < 70) -Uncontrolled -History of TIA, CVA  -Current treatment: Atorvastatin 40 mg daily (AM) -Current treatment: Aspirin 81 mg twice daily (AM) -Medications previously tried: NA  -Recommended to continue current medication  Depression (Goal: Maintain symptom remission) -Controlled -Current treatment: Sertraline 50 mg daily  -Medications previously tried/failed: NA -PHQ9: 3 -Recommended to continue current medication  GERD (Goal: Prevent Reflux) -Controlled History of stomach ulcer  -Current treatment  Pantoprazole 40 mg twice daily: Query Appropriate  -Medications previously tried: NA  -Query appropriate use of twice daily pantoprazole, defer potential changes for now.  -Recommended to continue current medication  Chronic Kidney Disease Stage 3a  -All medications assessed for renal dosing and appropriateness in chronic kidney disease. -Recommended to continue current medication  Tobacco use (Goal Quit smoking) -On track  -Previous quit attempts: NA -Current treatment  Tobacco dip once daily   -Patient smokes After 30 minutes of waking -Patient triggers include: finishing a meal Patient set quit date of 01/16/22. Counseled to allow lozenge to dissolve and absorb in cheek pocket, rather than swallow, to reduce GI side effects. Provided contact information for [place] [facility]uit Line (1-800-QUIT-NOW) and encouraged patient to reach out to this group for support. -START Wellbutrin SR 150 mg daily for 3 days, then INCREASE to 150 mg twice daily  -START Nicotine lozenge 2 mg as needed for breakthrough symptoms.   Patient  Goals/Self-Care Activities Patient will:  - check blood pressure weekly, document, and provide at future appointments  Follow Up Plan: Telephone follow up appointment with care management  team member scheduled for:  01/21/2022 at 10:00 AM    Patient agreed to services and verbal consent obtained.   Patient verbalizes understanding of instructions and care plan provided today and agrees to view in [place]. Active MyChart status and patient understanding of how to access instructions and care plan via MyChart confirmed with patient.     Junius Argyle, PharmD, [place], CPP  Clinical Pharmacist Practitioner  [facility] [phone]

## 2021-12-22 ENCOUNTER — Other Ambulatory Visit: Payer: Self-pay | Admitting: Family Medicine

## 2021-12-22 DIAGNOSIS — Z720 Tobacco use: Secondary | ICD-10-CM

## 2021-12-28 ENCOUNTER — Ambulatory Visit (INDEPENDENT_AMBULATORY_CARE_PROVIDER_SITE_OTHER): Payer: Medicare Other | Admitting: Family Medicine

## 2021-12-28 ENCOUNTER — Encounter: Payer: Self-pay | Admitting: Family Medicine

## 2021-12-28 VITALS — BP 108/60 | HR 61 | Resp 17 | Ht 68.0 in | Wt 208.0 lb

## 2021-12-28 DIAGNOSIS — I10 Essential (primary) hypertension: Secondary | ICD-10-CM

## 2021-12-28 DIAGNOSIS — R053 Chronic cough: Secondary | ICD-10-CM | POA: Diagnosis not present

## 2021-12-28 DIAGNOSIS — Z23 Encounter for immunization: Secondary | ICD-10-CM

## 2021-12-28 DIAGNOSIS — I25119 Atherosclerotic heart disease of native coronary artery with unspecified angina pectoris: Secondary | ICD-10-CM | POA: Diagnosis not present

## 2021-12-28 DIAGNOSIS — Z289 Immunization not carried out for unspecified reason: Secondary | ICD-10-CM

## 2021-12-28 DIAGNOSIS — I69993 Ataxia following unspecified cerebrovascular disease: Secondary | ICD-10-CM | POA: Diagnosis not present

## 2021-12-28 DIAGNOSIS — I110 Hypertensive heart disease with heart failure: Secondary | ICD-10-CM

## 2021-12-28 DIAGNOSIS — J42 Unspecified chronic bronchitis: Secondary | ICD-10-CM

## 2021-12-28 MED ORDER — SHINGRIX 50 MCG/0.5ML IM SUSR: 0.5000 mL | Freq: Once | INTRAMUSCULAR | 0 refills | Status: AC

## 2021-12-28 MED ORDER — LISINOPRIL-HYDROCHLOROTHIAZIDE 10-12.5 MG PO TABS: 1.0000 | ORAL_TABLET | Freq: Every day | ORAL | 3 refills | Status: DC

## 2021-12-28 MED ORDER — TRELEGY ELLIPTA 100-62.5-25 MCG/ACT IN AEPB: 1.0000 | INHALATION_SPRAY | Freq: Every day | RESPIRATORY_TRACT | 0 refills | Status: DC

## 2021-12-28 NOTE — Patient Instructions (Signed)
Please review the attached list of medications and notify my office if there are any errors.   Your previous MRI show old strokes in the part of your brain that controls balance. The only treatment for this is physical therapy

## 2021-12-28 NOTE — Progress Notes (Signed)
I,Tiffany J Bragg,acting as a scribe for Lelon Huh, MD.,have documented all relevant documentation on the behalf of Lelon Huh, MD,as directed by  Lelon Huh, MD while in the presence of Lelon Huh, MD.  Established patient visit   Patient: Cameron Foster.   DOB: 20-Dec-1940   81 y.o. Male  MRN: [id number] Visit Date: 12/28/2021  Today's healthcare provider: Lelon Huh, MD    Subjective    HPI  Patient complains of dizziness, loss of balance starting for several years, but has been worse since r having his hip replacement 3 months ago. He did go to rehab after surgery, but has not had any PT for balance and coordination. Review of records indications MRI in 2020 showing multiple old cerebellar infarcts, subsequent MRI in February 2021 disclosed small acute lacunar infarct at the right motor strip.   Patient also complains of sinus drainage, coughing up sputum, SOB starting a long time ago. Says he uses dip and does not know if that is effecting his sinuses. His most recent chest xray in march 2023 found changes suggestive of chronic bronchitis.    Medications: Outpatient Medications Prior to Visit  Medication Sig   aspirin 81 MG chewable tablet Chew 1 tablet (81 mg total) by mouth 2 (two) times daily.   atorvastatin (LIPITOR) 40 MG tablet Take 1 tablet (40 mg total) by mouth daily.   [START ON 01/09/2022] buPROPion (WELLBUTRIN SR) 150 MG 12 hr tablet Take 1 tablet (150 mg total) by mouth daily for 3 days, THEN 1 tablet (150 mg total) 2 (two) times daily for 27 days.   [START ON 02/08/2022] buPROPion (WELLBUTRIN SR) 150 MG 12 hr tablet Take 1 tablet (150 mg total) by mouth 2 (two) times daily.   docusate sodium (COLACE) 100 MG capsule Take 1 capsule (100 mg total) by mouth 2 (two) times daily. (Patient taking differently: Take 200 mg by mouth daily.)   fluticasone (FLONASE) 50 MCG/ACT nasal spray PLACE 1 SPRAY INTO BOTH NOSTRILS DAILY AS NEEDED FOR ALLERGIES OR  RHINITIS.   hydrocortisone 2.5 % cream APPLY TO AFFECTED AREA TWICE A DAY AS NEEDED FOR RASH   lisinopril-hydrochlorothiazide (ZESTORETIC) 20-25 MG tablet Take 1 tablet by mouth daily.   meclizine (ANTIVERT) 25 MG tablet Take 25 mg by mouth daily as needed (Vertigo).   metoprolol tartrate (LOPRESSOR) 100 MG tablet Take 1 tablet (100 mg total) by mouth 2 (two) times daily.   Multiple Vitamin (MULTIVITAMIN WITH MINERALS) TABS tablet Take 1 tablet by mouth daily.   pantoprazole (PROTONIX) 40 MG tablet Take 1 tablet (40 mg total) by mouth 2 (two) times daily.   sertraline (ZOLOFT) 50 MG tablet Take 1 tablet (50 mg total) by mouth daily.   vitamin B-12 (CYANOCOBALAMIN) 1000 MCG tablet Take 1,000 mcg by mouth daily.   No facility-administered medications prior to visit.    Review of Systems  Constitutional:  Negative for appetite change, chills and fever.  Respiratory:  Positive for cough, shortness of breath and wheezing. Negative for chest tightness.   Cardiovascular:  Negative for chest pain and palpitations.  Gastrointestinal:  Negative for abdominal pain, nausea and vomiting.       Objective    BP 114/62 (BP Location: Right Arm, Patient Position: Sitting, Cuff Size: Normal)   Pulse 61   Resp 17   Ht 5' 8"  (1.727 m)   Wt 208 lb (94.3 kg)   SpO2 98%   BMI 31.63 kg/m  Physical Exam   General: Appearance:    Obese male in no acute distress  Eyes:    PERRL, conjunctiva/corneas clear, EOM's intact       Lungs:     Occasional expiratory wheezes, no focal rales or rhonchi, respirations unlabored  Heart:    Normal heart rate. Regular rhythm. No murmurs, rubs, or gallops.    MS:   All extremities are intact.    Neurologic:   Awake, alert, oriented x 3. Ataxic gait. Difficulty with finger to nose.         Assessment & Plan     1. 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  - Ambulatory referral to Physical Therapy  2. Chronic  bronchitis, unspecified chronic bronchitis type ([place]) Try samples Fluticasone-Umeclidin-Vilant (TRELEGY ELLIPTA) 100-62.5-25 MCG/ACT AEPB; Inhale 1 puff into the lungs daily.  Dispense: 1 each; Refill: 0  He is to call for prescription if effective.   3. Essential hypertension Is somewhat hypotensive today complain of occasionally feeling light headed when standing. Will reduce lisinopril-hydrochlorothiazide (ZESTORETIC) 10-12.5 MG tablet; to 1 tablet by mouth daily.  Dispense: 90 tablet; Refill: 3   4. Hypertensive heart disease with congestive heart failure, unspecified heart failure type (HCC) l  5. Prescription for Shingrix. Vaccine not administered in office.   - Zoster Vaccine Adjuvanted [facility]) injection; Inject 0.5 mLs into the muscle once for 1 dose.  Dispense: 0.5 mL; Refill: 0  6. Need for influenza vaccination  - Flu Vaccine QUAD High Dose(Fluad)      The entirety of the information documented in the History of Present Illness, Review of Systems and Physical Exam were personally obtained by me. Portions of this information were initially documented by the CMA and reviewed by me for thoroughness and accuracy.     Lelon Huh, MD  [facility] [phone] (phone) [phone] (fax)  [place]

## 2021-12-31 ENCOUNTER — Encounter: Payer: Self-pay | Admitting: Family Medicine

## 2022-01-03 ENCOUNTER — Telehealth: Payer: Self-pay

## 2022-01-03 NOTE — Telephone Encounter (Addendum)
Im not sure who the caller is. Is it the patient, or [facility]?  There is also no call back number for [place] to call back to clarify what they were trying to say in the initial message. Please reach out to the [facility] agent who took the message for clarification.

## 2022-01-03 NOTE — Telephone Encounter (Signed)
Copied from [place] [phone]. Topic: Referral - Status >> Jan 03, 2022  3:28 PM Leilani Able wrote: [facility], not able to handle G[facility] Therapy at this time

## 2022-01-04 ENCOUNTER — Other Ambulatory Visit: Payer: Self-pay | Admitting: Physician Assistant

## 2022-01-04 ENCOUNTER — Other Ambulatory Visit: Payer: Self-pay | Admitting: Family Medicine

## 2022-01-04 ENCOUNTER — Other Ambulatory Visit: Payer: Self-pay | Admitting: Internal Medicine

## 2022-01-04 DIAGNOSIS — F320 Major depressive disorder, single episode, mild: Secondary | ICD-10-CM

## 2022-01-04 DIAGNOSIS — I10 Essential (primary) hypertension: Secondary | ICD-10-CM

## 2022-01-05 ENCOUNTER — Telehealth: Payer: Self-pay

## 2022-01-05 NOTE — Telephone Encounter (Signed)
I called and spoke with patient. He states he is taking Sertraline 66m daily.  He also states that he is still taking Amlodipine. He wasn't aware that this medication was discontinued. Patient wants to know if he needs to continue taking Amlodipine?

## 2022-01-05 NOTE — Telephone Encounter (Signed)
Copied from [place] [phone]. Topic: Referral - Request for Referral >> Dec 29, 2021  3:12 PM Tiffany B wrote: Reason for CRM:  Caller states PT is unable to see patient at [facility] because they are understaffed. Please send referral to another PT.

## 2022-01-05 NOTE — Telephone Encounter (Signed)
Refilled 11/17/21 # 30 with 5 refills.

## 2022-01-05 NOTE — Telephone Encounter (Signed)
I called patient. He states he did not call our office. The Initial message in this encounter is incomplete. I searched past CRM's and found a message from [facility]. CMR retrieved, converted to phone encounter, then sent to provider for review.

## 2022-01-05 NOTE — Telephone Encounter (Signed)
Please clarify medications with patient. Is he taking 90m or 539mof sertraline. Also, amlodipine was discontinue from his medication list. Has he restarted it? Or is the just pharmacy just not aware the med is discontinued?

## 2022-01-10 DIAGNOSIS — Z96641 Presence of right artificial hip joint: Secondary | ICD-10-CM | POA: Diagnosis not present

## 2022-01-12 ENCOUNTER — Telehealth: Payer: Self-pay

## 2022-01-12 DIAGNOSIS — Z720 Tobacco use: Secondary | ICD-10-CM

## 2022-01-12 MED ORDER — NICOTINE POLACRILEX 2 MG MT LOZG: 2.0000 mg | LOZENGE | OROMUCOSAL | 0 refills | Status: DC | PRN

## 2022-01-12 MED ORDER — NICOTINE 21 MG/24HR TD PT24: 21.0000 mg | MEDICATED_PATCH | Freq: Every day | TRANSDERMAL | 0 refills | Status: DC

## 2022-01-12 NOTE — Telephone Encounter (Signed)
Spoke with patient. He was unable to pick up Wellbutrin from [facility] and has not been able to get in touch with anyone from the [place] quitline. Patient quit using tobacco dip on 12/20/21   Recommended patient pick up Nicotine 21 mg/24 hr patch and the nicotine 2 mg lozenge.  -Counseled on patch placement, side effects, and option to remove at night if they experience trouble sleeping or bad dreams.  Counseled to allow lozenge to dissolve and absorb in cheek pocket, rather than swallow, to reduce GI side effects.  Follow-up with patient on 01/21/22.

## 2022-01-12 NOTE — Progress Notes (Signed)
Patient states he is really trying his best to quit dipping tobacco.Patient reports it has been 3 weeks since he had dip tobacco, but he is craving it bad. Patient is asking if the clinical pharmacist can give him a call if he has a chance to discuss this with him as he is interest in starting nicotine patches to see if that will help or his other options . Patient states  he would like  to know more about his options because he is  really trying hard  not to start dipping again. Patient reports  he reach out to the [employer] which resulted in a recording, and no one answer or calls him back. Notified Clinical pharmacist.   Anderson Malta Clinical Pharmacist Assistant [phone]

## 2022-01-12 NOTE — Addendum Note (Signed)
Addended by: Daron Offer A on: 01/12/2022 09:45 AM   Modules accepted: Orders

## 2022-01-15 DIAGNOSIS — F17210 Nicotine dependence, cigarettes, uncomplicated: Secondary | ICD-10-CM

## 2022-01-15 DIAGNOSIS — F32A Depression, unspecified: Secondary | ICD-10-CM | POA: Diagnosis not present

## 2022-01-15 DIAGNOSIS — E785 Hyperlipidemia, unspecified: Secondary | ICD-10-CM | POA: Diagnosis not present

## 2022-01-15 DIAGNOSIS — I4891 Unspecified atrial fibrillation: Secondary | ICD-10-CM

## 2022-01-15 DIAGNOSIS — N1831 Chronic kidney disease, stage 3a: Secondary | ICD-10-CM | POA: Diagnosis not present

## 2022-01-15 DIAGNOSIS — I129 Hypertensive chronic kidney disease with stage 1 through stage 4 chronic kidney disease, or unspecified chronic kidney disease: Secondary | ICD-10-CM | POA: Diagnosis not present

## 2022-01-21 ENCOUNTER — Ambulatory Visit (INDEPENDENT_AMBULATORY_CARE_PROVIDER_SITE_OTHER): Payer: Medicare Other

## 2022-01-21 DIAGNOSIS — Z720 Tobacco use: Secondary | ICD-10-CM

## 2022-01-21 DIAGNOSIS — I10 Essential (primary) hypertension: Secondary | ICD-10-CM

## 2022-01-21 NOTE — Progress Notes (Signed)
Chronic Care Management Pharmacy Note  01/24/2022 Name:  Cameron Foster. MRN:  [id number] DOB:  03/10/1941  Summary: Patient presents for CCM follow-up.   -Patient is in an action stage of smoking cessation. He quit using tobacco products on 12/20/21. He still struggles with cravings. He has not picked up nicotine lozenges.   Recommendations/Changes made from today's visit: -Continue Nicotine Patch 21 mg/24 hr until Nov 8, then will step down if patient amenable.  -START Nicotine Lozenge 2 mg as needed for breakthrough cravings.  -Counseled to allow lozenge to dissolve and absorb in cheek pocket, rather than swallow, to reduce GI side effects.  Plan: CPP follow-up 1 month  Subjective: Cameron Foster. is an 81 y.o. year old male who is a primary patient of Bacigalupo, Dionne Bucy, MD.  The CCM team was consulted for assistance with disease management and care coordination needs.    Engaged with patient by telephone for follow up visit in response to provider referral for pharmacy case management and/or care coordination services.   Consent to Services:  The patient was given information about Chronic Care Management services, agreed to services, and gave verbal consent prior to initiation of services.  Please see initial visit note for detailed documentation.   Patient Care Team: [name], MD as PCP - General (Family Medicine) End, Harrell Gave, MD as PCP - Cardiology (Cardiology) End, Harrell Gave, MD as Consulting Physician (Cardiology) [name], MD (Orthopedic Surgery) Schnier, Dolores Lory, MD (Vascular Surgery) [name], MD (Dermatology) [name], MD (Inactive) as Consulting Physician (Ophthalmology) [name], [facility] (Pharmacist)  Recent office visits:  07/09/21: patient presented to [name] for hospital follow-up.  07/06/21: Patient presented to [place], PA-C for follow-up. Docusate, hydrocortisone, melatonine, Miralax, thiamine  stopped.   Recent consult visits: 08/17/21: Dr. Saunders Revel recommended stopping Eliquis, starting Aspirin 81 mg daily.  08/12/21: Patient presented to Dr. Deloria Lair for headache. BP 190/113. Lisinopril 10 mg daily.  07/22/21: Patient presented to Dr. Saunders Revel for follow-up. Srop amiodarone.   Hospital visits: 06/22/21-07/03/21: Hospitalized due to colitis. Abx 3/8-3/14. Eliquis 5 mg twice daily. Amiodarone 200 mg twice daily. Stop amlodipine, furosemide, lisinopril,tramadol.   Admitted to the hospital on 01/13/2021 due to Melena. Discharge date was 01/14/2021. Discharged from [facility]?Medications Started at [facility] Discharge:?? -started None   Medication Changes at Hospital Discharge: -Changed none   Medications Discontinued at Hospital Discharge: -Stopped meloxicam ,stop omeprazole   Medications that remain the same after Hospital Discharge:??  -All other medications will remain the same.     Objective:  Lab Results  Component Value Date   CREATININE 1.37 (H) 08/24/2021   BUN 14 08/24/2021   GFR 57.85 (L) 02/21/2018   GFRNONAA 45 (L) 07/03/2021   GFRAA 62 08/15/2019   NA 140 08/24/2021   K 4.6 08/24/2021   CALCIUM 9.4 08/24/2021   CO2 27 08/24/2021   GLUCOSE 103 (H) 08/24/2021    Lab Results  Component Value Date/Time   HGBA1C 5.4 09/24/2020 03:12 PM   HGBA1C 5.6 06/09/2019 05:27 AM   HGBA1C 5.3 10/11/2018 12:00 AM   GFR 57.85 (L) 02/21/2018 11:56 AM   GFR 61.28 04/19/2017 03:50 PM    Last diabetic Eye exam: No results found for: "HMDIABEYEEXA"  Last diabetic Foot exam: No results found for: "HMDIABFOOTEX"   Lab Results  Component Value Date   CHOL 156 08/15/2019   HDL 46 08/15/2019   LDLCALC 85 08/15/2019  TRIG 144 08/15/2019   CHOLHDL 4.4 06/09/2019       Latest Ref Rng & Units 08/24/2021    2:16 PM 07/15/2021    3:16 PM 06/21/2021    8:35 PM  Hepatic Function  Total Protein 6.0 - 8.5 g/dL 6.9  6.4  7.6   Albumin 3.7 - 4.7 g/dL 4.0  3.7   3.9   AST 0 - 40 IU/L 22  18  26    ALT 0 - 44 IU/L 12  12  13    Alk Phosphatase 44 - 121 IU/L 71  80  79   Total Bilirubin 0.0 - 1.2 mg/dL 0.3  0.3  0.7     Lab Results  Component Value Date/Time   TSH 3.077 06/28/2021 04:26 AM   TSH 5.605 (H) 10/20/2018 09:46 PM   TSH 1.88 01/20/2015 11:22 AM   FREET4 0.66 10/21/2018 01:29 PM       Latest Ref Rng & Units 08/24/2021    2:16 PM 07/15/2021    3:16 PM 07/03/2021    6:14 AM  CBC  WBC 3.4 - 10.8 x10E3/uL 7.5  9.0  10.4   Hemoglobin 13.0 - 17.7 g/dL 11.0  10.7  10.5   Hematocrit 37.5 - 51.0 % 34.1  33.1  33.4   Platelets 150 - 450 x10E3/uL 250  310  377     No results found for: "VD25OH"  Clinical ASCVD: Yes  The ASCVD Risk score (Arnett DK, et al., 2019) failed to calculate for the following reasons:   The 2019 ASCVD risk score is only valid for ages 14 to 64   The patient has a prior MI or stroke diagnosis       12/28/2021    1:58 PM 07/06/2021    1:10 PM 06/08/2021    3:04 PM  Depression screen PHQ 2/9  Decreased Interest 1 1 3   Down, Depressed, Hopeless 0 1 2  PHQ - 2 Score 1 2 5   Altered sleeping 3 3 1   Tired, decreased energy 3 3 3   Change in appetite 0 0 0  Feeling bad or failure about yourself  1 0 0  Trouble concentrating 3 0 0  Moving slowly or fidgety/restless 0 0 0  Suicidal thoughts 0 0 0  PHQ-9 Score 11 8 9   Difficult doing work/chores Not difficult at all Not difficult at all Extremely dIfficult    Social History   Tobacco Use  Smoking Status Former   Packs/day: 1.00   Years: 27.00   Total pack years: 27.00   Types: Cigarettes   Quit date: 04/18/1988   Years since quitting: 33.7  Smokeless Tobacco Former   Types: Chew   Quit date: 12/20/2021   BP Readings from Last 3 Encounters:  12/28/21 108/60  10/13/21 (!) 146/84  09/30/21 133/72   Pulse Readings from Last 3 Encounters:  12/28/21 61  10/13/21 65  09/30/21 61   Wt Readings from Last 3 Encounters:  12/28/21 208 lb (94.3 kg)  09/27/21 203  lb (92.1 kg)  09/21/21 203 lb (92.1 kg)   BMI Readings from Last 3 Encounters:  12/28/21 31.63 kg/m  09/27/21 30.87 kg/m  09/21/21 30.87 kg/m    Assessment/Interventions: Review of patient past medical history, allergies, medications, health status, including review of consultants reports, laboratory and other test data, was performed as part of comprehensive evaluation and provision of chronic care management services.   SDOH:  (Social Determinants of Health) assessments and interventions performed: Yes SDOH Interventions  Flowsheet Row Chronic Care Management from 01/21/2022 in [facility] Most recent reading at 01/24/2022 10:42 AM Video Visit from 07/06/2021 in [facility] Most recent reading at 07/06/2021  1:10 PM Chronic Care Management from 07/06/2021 in [facility] Most recent reading at 07/06/2021 11:48 AM Chronic Care Management from 05/31/2021 in [facility] Most recent reading at 06/16/2021 10:13 AM Office Visit from 06/08/2021 in [facility] Most recent reading at 06/08/2021  3:04 PM Clinical Support from 05/24/2021 in [facility] Most recent reading at 05/24/2021  2:00 PM  SDOH Interventions        Food Insecurity Interventions -- -- -- -- -- Intervention Not Indicated  Housing Interventions -- -- -- -- -- Intervention Not Indicated  Transportation Interventions -- -- -- -- -- Intervention Not Indicated  Depression Interventions/Treatment  -- Medication -- -- Currently on Treatment --  Financial Strain Interventions Intervention Not Indicated -- Intervention Not Indicated Intervention Not Indicated -- Intervention Not Indicated  Physical Activity Interventions -- -- -- -- -- Patient Refused  Stress Interventions -- -- -- -- -- Intervention Not Indicated  Social Connections Interventions -- -- -- -- -- Intervention Not Indicated         SDOH Screenings   Food Insecurity: No Food  Insecurity (05/24/2021)  Housing: Low Risk  (05/24/2021)  Transportation Needs: No Transportation Needs (05/24/2021)  Alcohol Screen: Low Risk  (05/24/2021)  Depression (PHQ2-9): High Risk (12/28/2021)  Financial Resource Strain: Low Risk  (01/24/2022)  Physical Activity: Inactive (05/24/2021)  Social Connections: Moderately Integrated (05/24/2021)  Stress: No Stress Concern Present (05/24/2021)  Tobacco Use: Medium Risk (01/24/2022)    CCM Care Plan  Allergies  Allergen Reactions   Formaldehyde Rash    From shoes made with this material    Medications Reviewed Today     Reviewed by Smitty Knudsen, CMA (Certified Medical Assistant) on 12/28/21 at 1357  Med List Status: <None>   Medication Order Taking? Sig Documenting Provider Last Dose Status Informant  aspirin 81 MG chewable tablet [id number] Yes Chew 1 tablet (81 mg total) by mouth 2 (two) times daily. Carlynn Spry, PA-C Taking Active   atorvastatin (LIPITOR) 40 MG tablet [id number] Yes Take 1 tablet (40 mg total) by mouth daily. Gwyneth Sprout, FNP Taking Active   buPROPion [facility] SR) 150 MG 12 hr tablet [id number] Yes Take 1 tablet (150 mg total) by mouth daily for 3 days, THEN 1 tablet (150 mg total) 2 (two) times daily for 27 days. [name], MD Taking Active   buPROPion [facility] SR) 150 MG 12 hr tablet [id number] Yes Take 1 tablet (150 mg total) by mouth 2 (two) times daily. [name], MD Taking Active   docusate sodium (COLACE) 100 MG capsule [id number] Yes Take 1 capsule (100 mg total) by mouth 2 (two) times daily.  Patient taking differently: Take 200 mg by mouth daily.   Carlynn Spry, PA-C Taking Active   fluticasone (FLONASE) 50 MCG/ACT nasal spray [id number] Yes PLACE 1 SPRAY INTO BOTH NOSTRILS DAILY AS NEEDED FOR ALLERGIES OR RHINITIS. [name], MD Taking Active   hydrocortisone 2.5 % cream [id number] Yes APPLY TO AFFECTED AREA TWICE A DAY AS NEEDED FOR RASH Myles Gip, DO Taking Active    lisinopril-hydrochlorothiazide (ZESTORETIC) 20-25 MG tablet [id number] Yes Take 1 tablet by mouth daily. Gwyneth Sprout, FNP Taking Active   meclizine (ANTIVERT) 25 MG tablet [id number] Yes Take 25 mg by mouth daily as needed (  Vertigo). [provider] Taking Active Self  metoprolol tartrate (LOPRESSOR) 100 MG tablet [id number] Yes Take 1 tablet (100 mg total) by mouth 2 (two) times daily. Gwyneth Sprout, FNP Taking Active   Multiple Vitamin (MULTIVITAMIN WITH MINERALS) TABS tablet [id number] Yes Take 1 tablet by mouth daily. Vaughan Basta, MD Taking Active Self  pantoprazole (PROTONIX) 40 MG tablet [id number] Yes Take 1 tablet (40 mg total) by mouth 2 (two) times daily. Mardene Speak, PA-C Taking Active   sertraline (ZOLOFT) 50 MG tablet [id number] Yes Take 1 tablet (50 mg total) by mouth daily. Mecum, Dani Gobble, PA-C Taking Active Self  vitamin B-12 (CYANOCOBALAMIN) 1000 MCG tablet [id number] Yes Take 1,000 mcg by mouth daily. [provider] Taking Active Self            Patient Active Problem List   Diagnosis Date Noted   Ataxia, 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   Tobacco abuse 12/21/2021   Chronic bronchitis ([place]) 10/13/2021   History of total hip replacement, right 09/27/2021   Persistent atrial fibrillation ([place])    Atrial fibrillation with rapid ventricular response ([place])    Acute clinical systolic heart failure ([place]) 06/26/2021   Left sided colitis ([place]) 06/23/2021   Actinic keratosis 05/24/2021   Squamous cell cancer of skin of left forearm 05/24/2021   Chronic gastric ulcer with hemorrhage 02/25/2021   Irregular Z line of esophagus 02/25/2021   Chronic pansinusitis 01/26/2021   Dizziness 01/26/2021   Chronic cough 01/26/2021   Melena 01/13/2021   Alcohol use disorder, moderate, dependence ([place]) 01/13/2021   Stage 3a chronic kidney disease ([place]) 11/23/2020   Chronic pain of right ankle 05/21/2020   Achilles tendinitis 04/30/2020    Synovitis and tenosynovitis 04/30/2020   Lymphedema 03/03/2020   Chronic venous insufficiency 03/03/2020   Fatigue 02/14/2020   Leg edema 02/14/2020   Foraminal stenosis of lumbar region 11/08/2019   Chronic bilateral low back pain without sciatica 10/18/2019   Foraminal stenosis of cervical region 10/18/2019   PSVT (paroxysmal supraventricular tachycardia) 08/01/2019   History of lacunar cerebrovascular accident (CVA) 07/10/2019   History of stroke 07/01/2019   Paroxysmal atrial fibrillation ([place]) 11/07/2018   TIA (transient ischemic attack) 10/20/2018   History of subarachnoid hemorrhage 10/16/2018   Low libido 08/30/2018   Obesity 05/14/2018   Hyperlipidemia LDL goal <70 05/14/2018   Degeneration of lumbar intervertebral disc 03/22/2018   Osteoarthritis of hip 03/22/2018   Trochanteric bursitis of right hip 03/22/2018   History of colostomy reversal 03/21/2017   Status post partial colectomy 01/03/2017   Depression, major, single episode, mild ([place]) 01/03/2017   Vasovagal syncope 01/27/2016   History of skin cancer 10/28/2015   Osteoarthritis of right knee 10/01/2015   Essential hypertension 08/25/2015   Dyspnea on exertion 08/12/2015   Erectile dysfunction following radiation therapy 08/04/2015   Constipation 08/02/2015   History of prostate cancer 01/20/2015   Swelling of right lower extremity 01/20/2015    Immunization History  Administered Date(s) Administered   Fluad Quad(high Dose 65+) 01/14/2020, 01/26/2021, 12/28/2021   Influenza, High Dose Seasonal PF 01/26/2017, 02/01/2018, 01/31/2019   Influenza,inj,Quad PF,6+ Mos 02/08/2016   Influenza,inj,quad, With Preservative 12/18/2014   PFIZER Comirnaty(Gray Top)Covid-19 Tri-Sucrose Vaccine 08/24/2020   PFIZER(Purple Top)SARS-COV-2 Vaccination 05/31/2019, 06/25/2019, 01/22/2020   Pneumococcal Conjugate-13 05/14/2018   Pneumococcal Polysaccharide-23 12/18/2014   Pneumococcal-Unspecified 12/18/2014   Tdap 01/14/2020     Conditions to be addressed/monitored:  Hypertension, Hyperlipidemia, Atrial Fibrillation, Chronic Kidney Disease, Depression, Osteoarthritis, and History of TIA,  CVA   Care Plan : General Pharmacy (Adult)  Updates made by [name], RPH since 01/24/2022 12:00 AM     Problem: Hypertension, Hyperlipidemia, Atrial Fibrillation, Chronic Kidney Disease, Depression, Osteoarthritis, and History of TIA, CVA   Priority: High     Long-Range Goal: Patient-Specific Goal   Start Date: 02/25/2021  Expected End Date: 01/25/2023  This Visit's Progress: On track  Recent Progress: On track  Priority: High  Note:   Current Barriers:  Unable to self administer medications as prescribed  Pharmacist Clinical Goal(s):  Patient will maintain control of blood pressure as evidenced by BP less than 140/90  through collaboration with PharmD and provider.   Interventions: 1:1 collaboration with [name], MD regarding development and update of comprehensive plan of care as evidenced by provider attestation and co-signature Inter-disciplinary care team collaboration (see longitudinal plan of care) Comprehensive medication review performed; medication list updated in electronic medical record  Hypertension (BP goal <140/90) -Controlled -Current treatment: Metoprolol tartrate 100 mg twice daily: Appropriate, Effective, Safe, Accessible  Lisinopril-HCTZ 20-25 mg daily: Appropriate, Effective, Safe, Accessible  -Medications previously tried: Atenolol, Bisoprolol, nebivolol, amlodipine, lisinopril  -Current home readings: 130/85 -Current dietary habits: Eats what I want to eat. 1 cup of coffee. Ocassional sweet tea or regular soda. Eats out large meal once weekly. Does salt food.  -Current exercise habits: None -Denies hypotensive/hypertensive symptoms, but does note significant fatigue throughout the day.  -Recommended to continue current medication  Atrial Fibrillation (Goal: prevent  stroke and major bleeding) -Controlled -CHADSVASC: 7  -Current treatment: Rate control:  Metoprolol 100 mg twice daily  Anticoagulation: Aspirin 81 mg twice daily  -Medications previously tried: NA -Recommended to continue current medication  Hyperlipidemia: (LDL goal < 70) -Uncontrolled -History of TIA, CVA  -Current treatment: Atorvastatin 40 mg daily (AM) -Current treatment: Aspirin 81 mg twice daily (AM) -Medications previously tried: NA  -Recommended to continue current medication  Depression (Goal: Maintain symptom remission) -Controlled -Current treatment: Sertraline 50 mg daily  -Medications previously tried/failed: NA -PHQ9: 3 -Recommended to continue current medication  GERD (Goal: Prevent Reflux) -Controlled History of stomach ulcer  -Current treatment  Pantoprazole 40 mg twice daily: Query Appropriate  -Medications previously tried: NA  -Query appropriate use of twice daily pantoprazole, defer potential changes for now.  -Recommended to continue current medication  Chronic Kidney Disease Stage 3a  -All medications assessed for renal dosing and appropriateness in chronic kidney disease. -Recommended to continue current medication  Tobacco use (Goal Quit smoking) -On track  -Previous quit attempts: NA -Current treatment  Nicotine 21 mg/24hr Patch daily -Patient smokes After 30 minutes of waking -Patient triggers include: finishing a meal and needing to do something with your hands/mouth -Patient is in an action stage of smoking cessation. He quit using tobacco products on 12/20/21. He still struggles with cravings. He has not picked up nicotine lozenges. -Continue Nicotine Patch 21 mg/24 hr until Nov 8, then plan to step down.  -START Nicotine Lozenge 2 mg as needed for breakthrough cravings.  -Counseled to allow lozenge to dissolve and absorb in cheek pocket, rather than swallow, to reduce GI side effects.  Patient Goals/Self-Care Activities Patient will:   - check blood pressure weekly, document, and provide at future appointments  Follow Up Plan: Telephone follow up appointment with care management team member scheduled for:  02/22/2022 at 10:00 AM     Medication Assistance: None required.  Patient affirms current coverage meets needs.  Compliance/Adherence/Medication fill history: Care Gaps:  Shingrix Vaccine Pneumonia Vaccine COVID-19 Vaccine  Star-Rating Drugs: Atorvastatin 40 mg last filled 07/06/21 [facility]. Lisinopril 10 mg daily last filled 08/12/21 for 90-DS  Patient[facility]  [id number] [place] [id number]: [phone]: [phone] Uses pill box? Yes Pt endorses 100% compliance  We discussed: Current pharmacy is preferred with insurance plan and patient is satisfied with pharmacy services Patient decided to: Continue current medication management strategy  Care Plan and Follow Up Patient Decision:  Patient agrees to Care Plan and Follow-up.  Plan: Telephone follow up appointment with care management team member scheduled for:  02/22/2022 at 10:00 AM  AJunius Argyle PharmD, BPara [phone]

## 2022-01-24 DIAGNOSIS — Z23 Encounter for immunization: Secondary | ICD-10-CM | POA: Diagnosis not present

## 2022-01-24 NOTE — Patient Instructions (Signed)
Visit Information It was great speaking with you today!  Please let me know if you have any questions about our visit.   Goals Addressed   None     Patient Care Plan: General Pharmacy (Adult)     Problem Identified: Hypertension, Hyperlipidemia, Atrial Fibrillation, Chronic Kidney Disease, Depression, Osteoarthritis, and History of TIA, CVA   Priority: High     Long-Range Goal: Patient-Specific Goal   Start Date: 02/25/2021  Expected End Date: 01/25/2023  This Visit's Progress: On track  Recent Progress: On track  Priority: High  Note:   Current Barriers:  Unable to self administer medications as prescribed  Pharmacist Clinical Goal(s):  Patient will maintain control of blood pressure as evidenced by BP less than 140/90  through collaboration with PharmD and provider.   Interventions: 1:1 collaboration with [name], MD regarding development and update of comprehensive plan of care as evidenced by provider attestation and co-signature Inter-disciplinary care team collaboration (see longitudinal plan of care) Comprehensive medication review performed; medication list updated in electronic medical record  Hypertension (BP goal <140/90) -Controlled -Current treatment: Metoprolol tartrate 100 mg twice daily: Appropriate, Effective, Safe, Accessible  Lisinopril-HCTZ 20-25 mg daily: Appropriate, Effective, Safe, Accessible  -Medications previously tried: Atenolol, Bisoprolol, nebivolol, amlodipine, lisinopril  -Current home readings: 130/85 -Current dietary habits: Eats what I want to eat. 1 cup of coffee. Ocassional sweet tea or regular soda. Eats out large meal once weekly. Does salt food.  -Current exercise habits: None -Denies hypotensive/hypertensive symptoms, but does note significant fatigue throughout the day.  -Recommended to continue current medication  Atrial Fibrillation (Goal: prevent stroke and major bleeding) -Controlled -CHADSVASC: 7  -Current  treatment: Rate control:  Metoprolol 100 mg twice daily  Anticoagulation: Aspirin 81 mg twice daily  -Medications previously tried: NA -Recommended to continue current medication  Hyperlipidemia: (LDL goal < 70) -Uncontrolled -History of TIA, CVA  -Current treatment: Atorvastatin 40 mg daily (AM) -Current treatment: Aspirin 81 mg twice daily (AM) -Medications previously tried: NA  -Recommended to continue current medication  Depression (Goal: Maintain symptom remission) -Controlled -Current treatment: Sertraline 50 mg daily  -Medications previously tried/failed: NA -PHQ9: 3 -Recommended to continue current medication  GERD (Goal: Prevent Reflux) -Controlled History of stomach ulcer  -Current treatment  Pantoprazole 40 mg twice daily: Query Appropriate  -Medications previously tried: NA  -Query appropriate use of twice daily pantoprazole, defer potential changes for now.  -Recommended to continue current medication  Chronic Kidney Disease Stage 3a  -All medications assessed for renal dosing and appropriateness in chronic kidney disease. -Recommended to continue current medication  Tobacco use (Goal Quit smoking) -On track  -Previous quit attempts: NA -Current treatment  Nicotine 21 mg/24hr Patch daily -Patient smokes After 30 minutes of waking -Patient triggers include: finishing a meal and needing to do something with your hands/mouth -Patient is in an action stage of smoking cessation. He quit using tobacco products on 12/20/21. He still struggles with cravings. He has not picked up nicotine lozenges. -Continue Nicotine Patch 21 mg/24 hr until Nov 8, then plan to step down.  -START Nicotine Lozenge 2 mg as needed for breakthrough cravings.  -Counseled to allow lozenge to dissolve and absorb in cheek pocket, rather than swallow, to reduce GI side effects.  Patient Goals/Self-Care Activities Patient will:  - check blood pressure weekly, document, and provide at future  appointments  Follow Up Plan: Telephone follow up appointment with care management team member scheduled for:  02/22/2022 at 10:00 AM  Patient agreed to services and verbal consent obtained.   Patient verbalizes understanding of instructions and care plan provided today and agrees to view in [place]. Active MyChart status and patient understanding of how to access instructions and care plan via MyChart confirmed with patient.     Junius Argyle, PharmD, Para [place], CPP  Clinical Pharmacist Practitioner  [facility] [phone]

## 2022-02-04 DIAGNOSIS — M25551 Pain in right hip: Secondary | ICD-10-CM | POA: Diagnosis not present

## 2022-02-04 DIAGNOSIS — M25651 Stiffness of right hip, not elsewhere classified: Secondary | ICD-10-CM | POA: Diagnosis not present

## 2022-02-04 DIAGNOSIS — R2689 Other abnormalities of gait and mobility: Secondary | ICD-10-CM | POA: Diagnosis not present

## 2022-02-12 ENCOUNTER — Other Ambulatory Visit: Payer: Self-pay | Admitting: Family Medicine

## 2022-02-12 ENCOUNTER — Other Ambulatory Visit: Payer: Self-pay | Admitting: Physician Assistant

## 2022-02-12 ENCOUNTER — Other Ambulatory Visit: Payer: Self-pay | Admitting: Internal Medicine

## 2022-02-14 ENCOUNTER — Encounter (INDEPENDENT_AMBULATORY_CARE_PROVIDER_SITE_OTHER): Payer: Self-pay

## 2022-02-14 NOTE — Telephone Encounter (Signed)
Unable to refill per protocol, Rx expired. Medication was discontinued due to dose change. Will refuse.  Requested Prescriptions  Pending Prescriptions Disp Refills  . sertraline (ZOLOFT) 25 MG tablet [Pharmacy Med Name: SERTRALINE HCL 25 MG TABLET] 30 tablet 1    Sig: TAKE 1 TABLET (25 MG TOTAL) BY MOUTH DAILY.     Psychiatry:  Antidepressants - SSRI - sertraline Passed - 02/12/2022 12:17 PM      Passed - AST in normal range and within 360 days    AST  Date Value Ref Range Status  08/24/2021 22 0 - 40 IU/L Final         Passed - ALT in normal range and within 360 days    ALT  Date Value Ref Range Status  08/24/2021 12 0 - 44 IU/L Final         Passed - Completed PHQ-2 or PHQ-9 in the last 360 days      Passed - Valid encounter within last 6 months    Recent Outpatient Visits          1 month ago Ataxia, late effect of cerebrovascular disease   [facility] [place], Kirstie Peri, MD   4 months ago Essential hypertension   [employer], Regino Ramirez, PA-C   7 months ago Essential hypertension   [facility] [place], Dionne Bucy, MD   7 months ago Depression, major, single episode, mild [facility])   [employer], Erin E, PA-C   8 months ago Acute recurrent sinusitis, unspecified location   [employer], Dani Gobble, PA-C      Future Appointments            In 1 week Dunn, Areta Haber, PA-C [place]. [place]   In 1 month [name], MD [place]

## 2022-02-14 NOTE — Telephone Encounter (Signed)
Requested Prescriptions  Pending Prescriptions Disp Refills  . pantoprazole (PROTONIX) 40 MG tablet [Pharmacy Med Name: PANTOPRAZOLE SOD DR 40 MG TAB] 180 tablet 3    Sig: TAKE 1 TABLET BY MOUTH TWICE A DAY     Gastroenterology: Proton Pump Inhibitors Passed - 02/12/2022 12:17 PM      Passed - Valid encounter within last 12 months    Recent Outpatient Visits          1 month ago Ataxia, late effect of cerebrovascular disease   [place], Kirstie Peri, MD   4 months ago Essential hypertension   [employer], [place], PA-C   7 months ago Essential hypertension   [facility] [place], Dionne Bucy, MD   7 months ago Depression, major, single episode, mild [facility])   [employer], Erin E, PA-C   8 months ago Acute recurrent sinusitis, unspecified location   [employer], Dani Gobble, PA-C      Future Appointments            In 1 week Dunn, Areta Haber, PA-C [place]. [place]   In 1 month [name], MD [place]

## 2022-02-15 DIAGNOSIS — I4891 Unspecified atrial fibrillation: Secondary | ICD-10-CM

## 2022-02-15 DIAGNOSIS — E785 Hyperlipidemia, unspecified: Secondary | ICD-10-CM

## 2022-02-15 DIAGNOSIS — I129 Hypertensive chronic kidney disease with stage 1 through stage 4 chronic kidney disease, or unspecified chronic kidney disease: Secondary | ICD-10-CM | POA: Diagnosis not present

## 2022-02-15 DIAGNOSIS — N1831 Chronic kidney disease, stage 3a: Secondary | ICD-10-CM | POA: Diagnosis not present

## 2022-02-15 DIAGNOSIS — F32A Depression, unspecified: Secondary | ICD-10-CM

## 2022-02-15 DIAGNOSIS — F17210 Nicotine dependence, cigarettes, uncomplicated: Secondary | ICD-10-CM

## 2022-02-22 ENCOUNTER — Ambulatory Visit (INDEPENDENT_AMBULATORY_CARE_PROVIDER_SITE_OTHER): Payer: Medicare Other

## 2022-02-22 DIAGNOSIS — I10 Essential (primary) hypertension: Secondary | ICD-10-CM

## 2022-02-22 DIAGNOSIS — Z720 Tobacco use: Secondary | ICD-10-CM

## 2022-02-22 NOTE — Progress Notes (Signed)
Chronic Care Management Pharmacy Note  02/22/2022 Name:  Cameron Foster. MRN:  [id number] DOB:  05/18/1940  Summary: Patient presents for CCM follow-up.   -Reports hypotensive symptoms: intermittent dizziness and feeling off balanced. He notes this occurs most often on Sundays when he is at church. Counseled patient on importance of adequate hydration, slowly adjusting for postural changes, and getting a small of amount of activity a few days weekly to help build up strength. Patient is aware to contact PCP if symptoms persist or worsen.   -Quit using nicotine patches due to them being ineffective. Switched to nicotine 4 mg lozenge which has helped tremendously. He believes he is ready to step down.  Counseled to allow lozenge to dissolve and absorb in cheek pocket, rather than swallow, to reduce GI side effects.  Recommendations/Changes made from today's visit: -STOP Nicotine Patch. -DECREASE Nicotine Lozenge to 2 mg as needed   Plan: CPP follow-up 1 month  Subjective: Cameron Foster. is an 81 y.o. year old male who is a primary patient of Bacigalupo, Dionne Bucy, MD.  The CCM team was consulted for assistance with disease management and care coordination needs.    Engaged with patient by telephone for follow up visit in response to provider referral for pharmacy case management and/or care coordination services.   Consent to Services:  The patient was given information about Chronic Care Management services, agreed to services, and gave verbal consent prior to initiation of services.  Please see initial visit note for detailed documentation.   Patient Care Team: [name], MD as PCP - General (Family Medicine) End, Harrell Gave, MD as PCP - Cardiology (Cardiology) End, Harrell Gave, MD as Consulting Physician (Cardiology) [name], MD (Orthopedic Surgery) Schnier, Dolores Lory, MD (Vascular Surgery) [name], MD (Dermatology) [name], MD (Inactive) as  Consulting Physician (Ophthalmology) [name], [facility] (Pharmacist)  Recent office visits:  07/09/21: patient presented to [name] for hospital follow-up.  07/06/21: Patient presented to [facility], PA-C for follow-up. Docusate, hydrocortisone, melatonine, Miralax, thiamine stopped.   Recent consult visits: 08/17/21: Dr. Saunders Revel recommended stopping Eliquis, starting Aspirin 81 mg daily.  08/12/21: Patient presented to Dr. Deloria Lair for headache. BP 190/113. Lisinopril 10 mg daily.  07/22/21: Patient presented to Dr. Saunders Revel for follow-up. Srop amiodarone.   Hospital visits: 06/22/21-07/03/21: Hospitalized due to colitis. Abx 3/8-3/14. Eliquis 5 mg twice daily. Amiodarone 200 mg twice daily. Stop amlodipine, furosemide, lisinopril,tramadol.   Admitted to the hospital on 01/13/2021 due to Melena. Discharge date was 01/14/2021. Discharged from [place]?Medications Started at [facility] Discharge:?? -started None   Medication Changes at Hospital Discharge: -Changed none   Medications Discontinued at Hospital Discharge: -Stopped meloxicam ,stop omeprazole   Medications that remain the same after Hospital Discharge:??  -All other medications will remain the same.     Objective:  Lab Results  Component Value Date   CREATININE 1.37 (H) 08/24/2021   BUN 14 08/24/2021   GFR 57.85 (L) 02/21/2018   GFRNONAA 45 (L) 07/03/2021   GFRAA 62 08/15/2019   NA 140 08/24/2021   K 4.6 08/24/2021   CALCIUM 9.4 08/24/2021   CO2 27 08/24/2021   GLUCOSE 103 (H) 08/24/2021    Lab Results  Component Value Date/Time   HGBA1C 5.4 09/24/2020 03:12 PM   HGBA1C 5.6 06/09/2019 05:27 AM   HGBA1C 5.3 10/11/2018 12:00 AM   GFR 57.85 (L) 02/21/2018 11:56 AM   GFR 61.28 04/19/2017 03:50  PM    Last diabetic Eye exam: No results found for: "HMDIABEYEEXA"  Last diabetic Foot exam: No results found for: "HMDIABFOOTEX"   Lab Results  Component Value Date   CHOL 156 08/15/2019    HDL 46 08/15/2019   LDLCALC 85 08/15/2019   TRIG 144 08/15/2019   CHOLHDL 4.4 06/09/2019       Latest Ref Rng & Units 08/24/2021    2:16 PM 07/15/2021    3:16 PM 06/21/2021    8:35 PM  Hepatic Function  Total Protein 6.0 - 8.5 g/dL 6.9  6.4  7.6   Albumin 3.7 - 4.7 g/dL 4.0  3.7  3.9   AST 0 - 40 IU/L _0 ALT 0 - 44 IU/L _1 Alk Phosphatase 44 - 121 IU/L 71  80  79   Total Bilirubin 0.0 - 1.2 mg/dL 0.3  0.3  0.7     Lab Results  Component Value Date/Time   TSH 3.077 06/28/2021 04:26 AM   TSH 5.605 (H) 10/20/2018 09:46 PM   TSH 1.88 01/20/2015 11:22 AM   FREET4 0.66 10/21/2018 01:29 PM       Latest Ref Rng & Units 08/24/2021    2:16 PM 07/15/2021    3:16 PM 07/03/2021    6:14 AM  CBC  WBC 3.4 - 10.8 x10E3/uL 7.5  9.0  10.4   Hemoglobin 13.0 - 17.7 g/dL 11.0  10.7  10.5   Hematocrit 37.5 - 51.0 % 34.1  33.1  33.4   Platelets 150 - 450 x10E3/uL 250  310  377     No results found for: "VD25OH"  Clinical ASCVD: Yes  The ASCVD Risk score (Arnett DK, et al., 2019) failed to calculate for the following reasons:   The 2019 ASCVD risk score is only valid for ages 77 to 33   The patient has a prior MI or stroke diagnosis       12/28/2021    1:58 PM 07/06/2021    1:10 PM 06/08/2021    3:04 PM  Depression screen PHQ 2/9  Decreased Interest _2 Down, Depressed, Hopeless 0 1 2  PHQ - 2 Score _3 Altered sleeping _4 Tired, decreased energy _5 Change in appetite 0 0 0  Feeling bad or failure about yourself  1 0 0  Trouble concentrating 3 0 0  Moving slowly or fidgety/restless 0 0 0  Suicidal thoughts 0 0 0  PHQ-9 Score _6 Difficult doing work/chores Not difficult at all Not difficult at all Extremely dIfficult    Social History   Tobacco Use  Smoking Status Former   Packs/day: 1.00   Years: 27.00   Total pack years: 27.00   Types: Cigarettes   Quit date: 04/18/1988   Years since quitting: 33.8  Smokeless Tobacco Former   Types:  Chew   Quit date: 12/20/2021   BP Readings from Last 3 Encounters:  12/28/21 108/60  10/13/21 (!) 146/84  09/30/21 133/72   Pulse Readings from Last 3 Encounters:  12/28/21 61  10/13/21 65  09/30/21 61   Wt Readings from Last 3 Encounters:  12/28/21 208 lb (94.3 kg)  09/27/21 203 lb (92.1 kg)  09/21/21 203 lb (92.1 kg)   BMI Readings from Last 3 Encounters:  12/28/21 31.63 kg/m  09/27/21 30.87 kg/m  09/21/21 30.87 kg/m    Assessment/Interventions: Review of  patient past medical history, allergies, medications, health status, including review of consultants reports, laboratory and other test data, was performed as part of comprehensive evaluation and provision of chronic care management services.   SDOH:  (Social Determinants of Health) assessments and interventions performed: Yes SDOH Interventions    Flowsheet Row Chronic Care Management from 01/21/2022 in [facility] Most recent reading at 01/24/2022 10:42 AM Video Visit from 07/06/2021 in [facility] Most recent reading at 07/06/2021  1:10 PM Chronic Care Management from 07/06/2021 in [facility] Most recent reading at 07/06/2021 11:48 AM Chronic Care Management from 05/31/2021 in [facility] Most recent reading at 06/16/2021 10:13 AM Office Visit from 06/08/2021 in [facility] Most recent reading at 06/08/2021  3:04 PM Clinical Support from 05/24/2021 in [facility] Most recent reading at 05/24/2021  2:00 PM  SDOH Interventions        Food Insecurity Interventions -- -- -- -- -- Intervention Not Indicated  Housing Interventions -- -- -- -- -- Intervention Not Indicated  Transportation Interventions -- -- -- -- -- Intervention Not Indicated  Depression Interventions/Treatment  -- Medication -- -- Currently on Treatment --  Financial Strain Interventions Intervention Not Indicated -- Intervention Not Indicated Intervention Not Indicated --  Intervention Not Indicated  Physical Activity Interventions -- -- -- -- -- Patient Refused  Stress Interventions -- -- -- -- -- Intervention Not Indicated  Social Connections Interventions -- -- -- -- -- Intervention Not Indicated         SDOH Screenings   Food Insecurity: No Food Insecurity (05/24/2021)  Housing: Low Risk  (05/24/2021)  Transportation Needs: No Transportation Needs (05/24/2021)  Alcohol Screen: Low Risk  (05/24/2021)  Depression (PHQ2-9): High Risk (12/28/2021)  Financial Resource Strain: Low Risk  (01/24/2022)  Physical Activity: Inactive (05/24/2021)  Social Connections: Moderately Integrated (05/24/2021)  Stress: No Stress Concern Present (05/24/2021)  Tobacco Use: Medium Risk (01/24/2022)    CCM Care Plan  Allergies  Allergen Reactions   Formaldehyde Rash    From shoes made with this material    Medications Reviewed Today     Reviewed by Smitty Knudsen, CMA (Certified Medical Assistant) on 12/28/21 at 1357  Med List Status: <None>   Medication Order Taking? Sig Documenting Provider Last Dose Status Informant  aspirin 81 MG chewable tablet [id number] Yes Chew 1 tablet (81 mg total) by mouth 2 (two) times daily. Carlynn Spry, PA-C Taking Active   atorvastatin (LIPITOR) 40 MG tablet [id number] Yes Take 1 tablet (40 mg total) by mouth daily. Gwyneth Sprout, FNP Taking Active   buPROPion [facility] SR) 150 MG 12 hr tablet [id number] Yes Take 1 tablet (150 mg total) by mouth daily for 3 days, THEN 1 tablet (150 mg total) 2 (two) times daily for 27 days. [name], MD Taking Active   buPROPion [facility] SR) 150 MG 12 hr tablet [id number] Yes Take 1 tablet (150 mg total) by mouth 2 (two) times daily. [name], MD Taking Active   docusate sodium (COLACE) 100 MG capsule [id number] Yes Take 1 capsule (100 mg total) by mouth 2 (two) times daily.  Patient taking differently: Take 200 mg by mouth daily.   Carlynn Spry, PA-C Taking Active   fluticasone  (FLONASE) 50 MCG/ACT nasal spray [id number] Yes PLACE 1 SPRAY INTO BOTH NOSTRILS DAILY AS NEEDED FOR ALLERGIES OR RHINITIS. [name], MD Taking Active   hydrocortisone 2.5 % cream [id number] Yes APPLY TO AFFECTED AREA TWICE  A DAY AS NEEDED FOR RASH Myles Gip, DO Taking Active   lisinopril-hydrochlorothiazide (ZESTORETIC) 20-25 MG tablet [id number] Yes Take 1 tablet by mouth daily. Gwyneth Sprout, FNP Taking Active   meclizine (ANTIVERT) 25 MG tablet [id number] Yes Take 25 mg by mouth daily as needed (Vertigo). [provider] Taking Active Self  metoprolol tartrate (LOPRESSOR) 100 MG tablet [id number] Yes Take 1 tablet (100 mg total) by mouth 2 (two) times daily. Gwyneth Sprout, FNP Taking Active   Multiple Vitamin (MULTIVITAMIN WITH MINERALS) TABS tablet [id number] Yes Take 1 tablet by mouth daily. Vaughan Basta, MD Taking Active Self  pantoprazole (PROTONIX) 40 MG tablet [id number] Yes Take 1 tablet (40 mg total) by mouth 2 (two) times daily. Mardene Speak, PA-C Taking Active   sertraline (ZOLOFT) 50 MG tablet [id number] Yes Take 1 tablet (50 mg total) by mouth daily. Mecum, Dani Gobble, PA-C Taking Active Self  vitamin B-12 (CYANOCOBALAMIN) 1000 MCG tablet [id number] Yes Take 1,000 mcg by mouth daily. [provider] Taking Active Self            Patient Active Problem List   Diagnosis Date Noted   Ataxia, 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   Tobacco abuse 12/21/2021   Chronic bronchitis ([place]) 10/13/2021   History of total hip replacement, right 09/27/2021   Persistent atrial fibrillation ([place])    Atrial fibrillation with rapid ventricular response ([place])    Acute clinical systolic heart failure ([place]) 06/26/2021   Left sided colitis ([place]) 06/23/2021   Actinic keratosis 05/24/2021   Squamous cell cancer of skin of left forearm 05/24/2021   Chronic gastric ulcer with hemorrhage 02/25/2021   Irregular Z line of esophagus 02/25/2021    Chronic pansinusitis 01/26/2021   Dizziness 01/26/2021   Chronic cough 01/26/2021   Melena 01/13/2021   Alcohol use disorder, moderate, dependence ([place]) 01/13/2021   Stage 3a chronic kidney disease ([place]) 11/23/2020   Chronic pain of right ankle 05/21/2020   Achilles tendinitis 04/30/2020   Synovitis and tenosynovitis 04/30/2020   Lymphedema 03/03/2020   Chronic venous insufficiency 03/03/2020   Fatigue 02/14/2020   Leg edema 02/14/2020   Foraminal stenosis of lumbar region 11/08/2019   Chronic bilateral low back pain without sciatica 10/18/2019   Foraminal stenosis of cervical region 10/18/2019   PSVT (paroxysmal supraventricular tachycardia) 08/01/2019   History of lacunar cerebrovascular accident (CVA) 07/10/2019   History of stroke 07/01/2019   Paroxysmal atrial fibrillation ([place]) 11/07/2018   TIA (transient ischemic attack) 10/20/2018   History of subarachnoid hemorrhage 10/16/2018   Low libido 08/30/2018   Obesity 05/14/2018   Hyperlipidemia LDL goal <70 05/14/2018   Degeneration of lumbar intervertebral disc 03/22/2018   Osteoarthritis of hip 03/22/2018   Trochanteric bursitis of right hip 03/22/2018   History of colostomy reversal 03/21/2017   Status post partial colectomy 01/03/2017   Depression, major, single episode, mild ([place]) 01/03/2017   Vasovagal syncope 01/27/2016   History of skin cancer 10/28/2015   Osteoarthritis of right knee 10/01/2015   Essential hypertension 08/25/2015   Dyspnea on exertion 08/12/2015   Erectile dysfunction following radiation therapy 08/04/2015   Constipation 08/02/2015   History of prostate cancer 01/20/2015   Swelling of right lower extremity 01/20/2015    Immunization History  Administered Date(s) Administered   COVID-19, mRNA, vaccine(Comirnaty)12 years and older 01/24/2022   Fluad Quad(high Dose 65+) 01/14/2020, 01/26/2021, 12/28/2021   Influenza, High Dose Seasonal PF 01/26/2017, 02/01/2018, 01/31/2019   Influenza,inj,Quad  PF,6+ Mos 02/08/2016  Influenza,inj,quad, With Preservative 12/18/2014   PFIZER Comirnaty(Gray Top)Covid-19 Tri-Sucrose Vaccine 08/24/2020   PFIZER(Purple Top)SARS-COV-2 Vaccination 05/31/2019, 06/25/2019, 01/22/2020   Pneumococcal Conjugate-13 05/14/2018   Pneumococcal Polysaccharide-23 12/18/2014   Pneumococcal-Unspecified 12/18/2014   Tdap 01/14/2020   Zoster Recombinat (Shingrix) 01/24/2022    Conditions to be addressed/monitored:  Hypertension, Hyperlipidemia, Atrial Fibrillation, Chronic Kidney Disease, Depression, Osteoarthritis, and History of TIA, CVA   Care Plan : General Pharmacy (Adult)  Updates made by [name], RPH since 02/22/2022 12:00 AM     Problem: Hypertension, Hyperlipidemia, Atrial Fibrillation, Chronic Kidney Disease, Depression, Osteoarthritis, and History of TIA, CVA   Priority: High     Long-Range Goal: Patient-Specific Goal   Start Date: 02/25/2021  Expected End Date: 01/25/2023  This Visit's Progress: On track  Recent Progress: On track  Priority: High  Note:   Current Barriers:  Unable to self administer medications as prescribed  Pharmacist Clinical Goal(s):  Patient will maintain control of blood pressure as evidenced by BP less than 140/90  through collaboration with PharmD and provider.   Interventions: 1:1 collaboration with [name], MD regarding development and update of comprehensive plan of care as evidenced by provider attestation and co-signature Inter-disciplinary care team collaboration (see longitudinal plan of care) Comprehensive medication review performed; medication list updated in electronic medical record  Hypertension (BP goal <140/90) -Controlled -Current treatment: Metoprolol tartrate 100 mg twice daily: Appropriate, Effective, Safe, Accessible  Lisinopril-HCTZ 20-25 mg daily: Appropriate, Effective, Safe, Accessible  -Medications previously tried: Atenolol, Bisoprolol, nebivolol, amlodipine,  lisinopril  -Current home readings: 130/85 -Current dietary habits: Eats what I want to eat. 1 cup of coffee. Ocassional sweet tea or regular soda. Eats out large meal once weekly. Does salt food.  -Current exercise habits: None -Reports hypotensive symptoms: intermittent dizziness and feeling off balanced. He notes this occurs most often on Sundays when he is at church. Counseled patient on importance of adequate hydration, slowly adjusting for postural changes, and getting a small of amount of activity a few days weekly to help build up strength. Patient is aware to contact PCP if symptoms persist or worsen.  -Recommended to continue current medication  Atrial Fibrillation (Goal: prevent stroke and major bleeding) -Controlled -CHADSVASC: 7  -Current treatment: Rate control:  Metoprolol 100 mg twice daily  Anticoagulation: Aspirin 81 mg twice daily  -Medications previously tried: NA -Recommended to continue current medication  Hyperlipidemia: (LDL goal < 70) -Uncontrolled -History of TIA, CVA  -Current treatment: Atorvastatin 40 mg daily (AM) -Current treatment: Aspirin 81 mg twice daily (AM) -Medications previously tried: NA  -Recommended to continue current medication  Depression (Goal: Maintain symptom remission) -Controlled -Current treatment: Sertraline 50 mg daily  -Medications previously tried/failed: NA -PHQ9: 3 -Recommended to continue current medication  GERD (Goal: Prevent Reflux) -Controlled History of stomach ulcer  -Current treatment  Pantoprazole 40 mg twice daily: Query Appropriate  -Medications previously tried: NA  -Query appropriate use of twice daily pantoprazole, defer potential changes for now.  -Recommended to continue current medication  Chronic Kidney Disease Stage 3a  -All medications assessed for renal dosing and appropriateness in chronic kidney disease. -Recommended to continue current medication  Tobacco use (Goal Quit smoking) -On track   -Previous quit attempts: Nicotine Patch (Ineffective)  -Current treatment  Nicotine 2 mg lozenge as needed  -Patient smokes After 30 minutes of waking -Patient triggers include: finishing a meal and needing to do something with your hands/mouth -Patient is in an action stage of smoking cessation. He quit  using tobacco products on 12/20/21.  -Quit using nicotine patches due to them being ineffective. Switched to nicotine 4 mg lozenge which has helped tremendously. He believes he is ready to step down.  Counseled to allow lozenge to dissolve and absorb in cheek pocket, rather than swallow, to reduce GI side effects. -STOP Nicotine patch (ineffective)  -DECREASE Nicotine lozenge to 2 mg as needed.   Patient Goals/Self-Care Activities Patient will:  - check blood pressure weekly, document, and provide at future appointments  Follow Up Plan: Telephone follow up appointment with care management team member scheduled for:  03/22/2022 at 10:00 AM      Medication Assistance: None required.  Patient affirms current coverage meets needs.  Compliance/Adherence/Medication fill history: Care Gaps: Shingrix Vaccine Pneumonia Vaccine COVID-19 Vaccine  Star-Rating Drugs: Atorvastatin 40 mg last filled 07/06/21 [facility]. Lisinopril 10 mg daily last filled 08/12/21 for 90-DS  Patient[facility] pharmacy [facility]  [id number] [place] [id number]: [phone]: [phone] Uses pill box? Yes Pt endorses 100% compliance  We discussed: Current pharmacy is preferred with insurance plan and patient is satisfied with pharmacy services Patient decided to: Continue current medication management strategy  Care Plan and Follow Up Patient Decision:  Patient agrees to Care Plan and Follow-up.  Plan: Telephone follow up appointment with care management team member scheduled for:  03/22/2022 at 10:00 AM  AJunius Argyle PharmD, BPara March [phone]

## 2022-02-22 NOTE — Patient Instructions (Signed)
Visit Information It was great speaking with you today!  Please let me know if you have any questions about our visit.   Goals Addressed             This Visit's Progress    Manage My Medicine   On track    Timeframe:  Long-Range Goal Priority:  High Start Date: 02/25/2021                            Expected End Date: 02/25/2022                      Follow Up within 90 days   - call for medicine refill 2 or 3 days before it runs out - keep a list of all the medicines I take; vitamins and herbals too - use a pillbox to sort medicine    Why is this important?   These steps will help you keep on track with your medicines.   Notes:         Patient Care Plan: General Pharmacy (Adult)     Problem Identified: Hypertension, Hyperlipidemia, Atrial Fibrillation, Chronic Kidney Disease, Depression, Osteoarthritis, and History of TIA, CVA   Priority: High     Long-Range Goal: Patient-Specific Goal   Start Date: 02/25/2021  Expected End Date: 01/25/2023  This Visit's Progress: On track  Recent Progress: On track  Priority: High  Note:   Current Barriers:  Unable to self administer medications as prescribed  Pharmacist Clinical Goal(s):  Patient will maintain control of blood pressure as evidenced by BP less than 140/90  through collaboration with PharmD and provider.   Interventions: 1:1 collaboration with [name], MD regarding development and update of comprehensive plan of care as evidenced by provider attestation and co-signature Inter-disciplinary care team collaboration (see longitudinal plan of care) Comprehensive medication review performed; medication list updated in electronic medical record  Hypertension (BP goal <140/90) -Controlled -Current treatment: Metoprolol tartrate 100 mg twice daily: Appropriate, Effective, Safe, Accessible  Lisinopril-HCTZ 20-25 mg daily: Appropriate, Effective, Safe, Accessible  -Medications previously tried: Atenolol,  Bisoprolol, nebivolol, amlodipine, lisinopril  -Current home readings: 130/85 -Current dietary habits: Eats what I want to eat. 1 cup of coffee. Ocassional sweet tea or regular soda. Eats out large meal once weekly. Does salt food.  -Current exercise habits: None -Reports hypotensive symptoms: intermittent dizziness and feeling off balanced. He notes this occurs most often on Sundays when he is at church. Counseled patient on importance of adequate hydration, slowly adjusting for postural changes, and getting a small of amount of activity a few days weekly to help build up strength. Patient is aware to contact PCP if symptoms persist or worsen.  -Recommended to continue current medication  Atrial Fibrillation (Goal: prevent stroke and major bleeding) -Controlled -CHADSVASC: 7  -Current treatment: Rate control:  Metoprolol 100 mg twice daily  Anticoagulation: Aspirin 81 mg twice daily  -Medications previously tried: NA -Recommended to continue current medication  Hyperlipidemia: (LDL goal < 70) -Uncontrolled -History of TIA, CVA  -Current treatment: Atorvastatin 40 mg daily (AM) -Current treatment: Aspirin 81 mg twice daily (AM) -Medications previously tried: NA  -Recommended to continue current medication  Depression (Goal: Maintain symptom remission) -Controlled -Current treatment: Sertraline 50 mg daily  -Medications previously tried/failed: NA -PHQ9: 3 -Recommended to continue current medication  GERD (Goal: Prevent Reflux) -Controlled History of stomach ulcer  -Current treatment  Pantoprazole 40 mg twice daily:  Query Appropriate  -Medications previously tried: NA  -Query appropriate use of twice daily pantoprazole, defer potential changes for now.  -Recommended to continue current medication  Chronic Kidney Disease Stage 3a  -All medications assessed for renal dosing and appropriateness in chronic kidney disease. -Recommended to continue current medication  Tobacco  use (Goal Quit smoking) -On track  -Previous quit attempts: Nicotine Patch (Ineffective)  -Current treatment  Nicotine 2 mg lozenge as needed  -Patient smokes After 30 minutes of waking -Patient triggers include: finishing a meal and needing to do something with your hands/mouth -Patient is in an action stage of smoking cessation. He quit using tobacco products on 12/20/21.  -Quit using nicotine patches due to them being ineffective. Switched to nicotine 4 mg lozenge which has helped tremendously. He believes he is ready to step down.  Counseled to allow lozenge to dissolve and absorb in cheek pocket, rather than swallow, to reduce GI side effects. -STOP Nicotine patch (ineffective)  -DECREASE Nicotine lozenge to 2 mg as needed.   Patient Goals/Self-Care Activities Patient will:  - check blood pressure weekly, document, and provide at future appointments  Follow Up Plan: Telephone follow up appointment with care management team member scheduled for:  03/22/2022 at 10:00 AM    Patient agreed to services and verbal consent obtained.   Patient verbalizes understanding of instructions and care plan provided today and agrees to view in [place]. Active MyChart status and patient understanding of how to access instructions and care plan via MyChart confirmed with patient.     Junius Argyle, PharmD, [place], CPP  Clinical Pharmacist Practitioner  [facility] [phone]

## 2022-02-24 DIAGNOSIS — M25651 Stiffness of right hip, not elsewhere classified: Secondary | ICD-10-CM | POA: Diagnosis not present

## 2022-02-24 DIAGNOSIS — R2689 Other abnormalities of gait and mobility: Secondary | ICD-10-CM | POA: Diagnosis not present

## 2022-02-24 DIAGNOSIS — M25551 Pain in right hip: Secondary | ICD-10-CM | POA: Diagnosis not present

## 2022-02-24 NOTE — Progress Notes (Signed)
Virtual Visit via Video Note   Because of Cameron CARTER Jr.'s co-morbid illnesses, he is at least at moderate risk for complications without adequate follow up.  This format is felt to be most appropriate for this patient at this time.  All issues noted in this document were discussed and addressed.  A limited physical exam was performed with this format.  Please refer to the patient's chart for his consent to telehealth for [facility].   Date:  02/25/2022   ID:  Cameron Foster., DOB 01-28-1941, MRN [id number] The patient was identified using 2 identifiers.  Patient Location: Home Provider Location: Office/Clinic   PCP:  [name], [place] Providers Cardiologist:  [name], MD {  Evaluation Performed:  Follow-Up Visit  Chief Complaint:  Follow up  History of Present Illness:    Cameron Foster. is a 81 y.o. male with persistent, lone A-fib in the setting of GI bleed from colitis in 06/2021 not on [place] due to bleeding risk, systolic dysfunction, subarachnoid hemorrhage of uncertain etiology complicated by CVA, HTN, HLD, prostate cancer, and alcohol use who presents for follow-up of A-fib.   He was admitted to the hospital in 06/2021 with left-sided colitis felt to be ischemic versus infectious.  Flexible sigmoidoscopy noted possible ischemic colitis.  He was treated with antibiotics.  His admission was complicated by hypotension and A-fib with RVR.  High-sensitivity troponin 18 with a delta troponin of 14.  BNP 569.  Echo demonstrated an EF of 45 to 50%, global hypokinesis, moderate concentric LVH, elevated left ventricular end-diastolic pressure, normal RV systolic function and ventricular cavity size, normal PASP, mildly dilated left atrium, mild mitral regurgitation, mild calcification of the aortic valve without evidence of stenosis, mildly dilated aortic root and ascending aorta measuring 41 and 39 mm respectively.  He was  ultimately placed on IV amiodarone by Dr. Rockey Situ for rate control, which led to pharmacologic cardioversion.  After extensive discussion of risk versus benefits of anticoagulation with the patient and his family, the decision was ultimately made to initiate apixaban.   He was seen in hospital follow-up on 07/22/2021 and was without symptoms of angina or decompensation.  He denied any further GI bleeding.  He remained concerned regarding continuation of apixaban given history of bleeding problems.  He also noted he had previously been scheduled for a hip arthroplasty, though this was deferred given his recent hospital admission.  Subsequent outpatient cardiac monitoring showed no evidence of A-fib with multiple brief episodes of paroxysmal SVT as well as 1 short run of NSVT.  Given lack of recurrence of A-fib, and to minimize bleeding risk, OAC was discontinued and he was placed back on aspirin.  He was last seen in the office in 08/2021 and was without symptoms of angina or decompensation.  His main limiting factor at that time continued to be right hip pain.  Given this, he planned to move forward with hip arthroplasty which was undertaken in 09/2021 without cardiac complication.  He is seen virtually today and is doing well from a cardiac perspective, without symptoms of angina or decompensation.  No palpitations or dyspnea.  He does note a history of chronic dizziness that has been a little worse lately.  In this setting, he has been delaying taking his antihypertensives to see if this helps.  He feels like this has contributed to his elevated BP reading.  He is also wondering about dehydration.  He  has also been taking some meclizine with some improvement.  No lower extremity swelling or orthopnea.  No presyncope or syncope.  No falls or bleeding concerns.   Labs independently reviewed: 08/2021 -BUN 14, serum creatinine 1.37, potassium 4.6, albumin 4.0, AST/ALT normal, Hgb 11.0, PLT 250 06/2021 - magnesium  1.9, digoxin 1.1, TSH normal, BNP 569, high-sensitivity troponin 18 with a delta troponin of 14 09/2020 - A1c 5.4 07/2019 - TC 156, TG 144, HDL 46, LDL 85   Past Medical History:  Diagnosis Date   Alcoholism ([place])    Aortic atherosclerosis ([place])    Arthritis    Ascending aorta dilation ([place]) 03/28/2018   a.) Vascular US: prox asc Ao measured 29 mm. b.)  CT CAP 06/22/2021: Ao root 41 mm. c.) TTE 06/26/2021: Ao root 41 mm; asc Ao 39 mm   Atrial fibrillation (HCC) 1995   a.) single episode in 1995 per patient; no long term treatment. b.) recurrent episode in the setting of GI bleeding related to colitis 06/2021.   Basal cell carcinoma 04/26/2017   Right medial cheek. Superficial and nodular   Basal cell carcinoma 06/11/2019   Left anterior shoulder. Nodular pattern   Basal cell carcinoma 09/09/2019   Right nasal ala, EDC   Basal cell carcinoma 02/05/2020   L upper eyebrow, EDC    Bilateral carotid artery stenosis 06/09/2019   a.) Carotid doppler: mod; <50% BILATERAL ICAs.   Chicken pox    Colon cancer (HCC)    Colon polyp    Depression    Son died 07/01/14   Diverticulitis    Diverticulosis 30 years   Gastritis    GERD (gastroesophageal reflux disease)    H. pylori infection    History of stress test    a. 09/2015 MV: No ischemia/infarct. EF 45-54% (nl by echo).   Hyperlipidemia    Hypertension    Irritable bowel syndrome    Lacunar infarction ([place]) 06/08/2019   a.) small; RIGHT motor strip   NSVT (nonsustained ventricular tachycardia) (HCC)    a.)  Single episode lasting 5 beats at a maximum rate of 160 bpm noted on Holter study performed 08/13/2021.   Prostate cancer ([place]) 07-01-10   a.) s/p XRT   PSVT (paroxysmal supraventricular tachycardia)    a. 06/2019 Zio: Avg rate 74 (54-120), occas PACs, rare PVCs, 125 episodes of PVCs (longest 17.5 secs; max rate 187). No afib.   RBBB    SAH (subarachnoid hemorrhage) ([place]) 10/10/2018   Squamous cell carcinoma of skin 07/18/2017   Left  medial calf. KA type   Squamous cell carcinoma of skin 04/24/2018   Right above med. brow   Squamous cell carcinoma of skin 06/11/2019   Right posterior shoulder. SCCis, hypertrophic   Squamous cell carcinoma of skin 01/09/2020   Mid nasal dorsum, MOHS, Efudex x 4wks   Systolic dysfunction    a.) TTE 10/05/2015: EF 50-55%; mild LVH; LAE; triv AR, mild MR. b.) TTE 03/29/2018: EF 55-60%; LAE, mild AR; ? small PFO. c.) TTE 06/09/2019: EF 55-60%, no rwma, triv MR/AI. d.) TTE 06/26/2021: EF 45-50%; glob HK; LAE; mild MR; Ao root 41 mm; asc Ao 39 mm.   Vasovagal syncope    Past Surgical History:  Procedure Laterality Date   CARDIAC CATHETERIZATION  07-01-96   [place] no stents   CATARACT EXTRACTION, BILATERAL     COLON RESECTION SIGMOID N/A 12/07/2016   Procedure: COLON RESECTION SIGMOID;  Surgeon: Clayburn Pert, MD;  Location: [facility] ORS;  Service: General;  Laterality: N/A;   COLON SURGERY  11/2016   Colostomy   COLONOSCOPY  2016   COLONOSCOPY WITH PROPOFOL N/A 05/30/2016   Procedure: COLONOSCOPY WITH PROPOFOL;  Surgeon: Lollie Sails, MD;  Location: [facility] END[facility]COPY;  Service: Endoscopy;  Laterality: N/A;   COLOSTOMY Left 12/07/2016   Procedure: COLOSTOMY;  Surgeon: Clayburn Pert, MD;  Location: [facility] ORS;  Service: General;  Laterality: Left;   COLOSTOMY REVERSAL N/A 03/21/2017   Procedure: COLOSTOMY REVERSAL;  Surgeon: Clayburn Pert, MD;  Location: [facility] ORS;  Service: General;  Laterality: N/A;   COLOSTOMY TAKEDOWN N/A 03/21/2017   Procedure: LAPAROSCOPIC COLOSTOMY TAKEDOWN;  Surgeon: Clayburn Pert, MD;  Location: [facility] ORS;  Service: General;  Laterality: N/A;   CYSTOSCOPY WITH STENT PLACEMENT Bilateral 03/21/2017   Procedure: CYSTOSCOPY WITH LIGHTED STENT PLACEMENT;  Surgeon: Abbie Sons, MD;  Location: [facility] ORS;  Service: Urology;  Laterality: Bilateral;   ESOPHAGOGASTRODUODENOSCOPY     ESOPHAGOGASTRODUODENOSCOPY N/A 05/30/2016   Procedure:  ESOPHAGOGASTRODUODENOSCOPY (EGD);  Surgeon: Lollie Sails, MD;  Location[facility] END[facility]COPY;  Service: Endoscopy;  Laterality: N/A;   ESOPHAGOGASTRODUODENOSCOPY N/A 01/14/2021   Procedure: ESOPHAGOGASTRODUODENOSCOPY (EGD);  Surgeon: Toledo, Benay Pike, MD;  Location: [facility] ENDOSCOPY;  Service: Gastroenterology;  Laterality: N/A;   ESOPHAGOGASTRODUODENOSCOPY (EGD) WITH PROPOFOL N/A 04/28/2021   Procedure: ESOPHAGOGASTRODUODENOSCOPY (EGD) WITH PROPOFOL;  Surgeon: Toledo, Benay Pike, MD;  Location: [facility] ENDOSCOPY;  Service: Gastroenterology;  Laterality: N/A;   EYE SURGERY     cataracts   FLEXIBLE SIGMOIDOSCOPY N/A 03/21/2017   Procedure: FLEXIBLE SIGMOIDOSCOPY;  Surgeon: Clayburn Pert, MD;  Location: [facility] ORS;  Service: General;  Laterality: N/A;   FLEXIBLE SIGMOIDOSCOPY N/A 06/24/2021   Procedure: Beryle Quant;  Surgeon: Jonathon Bellows, MD;  Location: [facility] ENDOSCOPY;  Service: Gastroenterology;  Laterality: N/A;   FRACTURE SURGERY Bilateral    right arm and left wrist   INCISION AND DRAINAGE ABSCESS N/A 12/07/2016   Procedure: DRAINAGE  OF INTRA ABDOMINAL ABSCESS;  Surgeon: Clayburn Pert, MD;  Location: [facility] ORS;  Service: General;  Laterality: N/A;   LAPAROTOMY N/A 12/07/2016   Procedure: EXPLORATORY LAPAROTOMY;  Surgeon: Clayburn Pert, MD;  Location: [facility] ORS;  Service: General;  Laterality: N/A;   PROSTATE SURGERY     Microwave therapy   TONSILLECTOMY     TOTAL HIP ARTHROPLASTY Right 09/27/2021   Procedure: TOTAL HIP ARTHROPLASTY ANTERIOR APPROACH;  Surgeon: Lovell Sheehan, MD;  Location: [facility] ORS;  Service: Orthopedics;  Laterality: Right;   TOTAL KNEE ARTHROPLASTY Right 07/27/2016   Procedure: TOTAL KNEE ARTHROPLASTY;  Surgeon: [name], MD;  Location: [facility] ORS;  Service: Orthopedics;  Laterality: Right;  Dr. Erlene Quan had to place Urinary catheter due to prostate cancer history.  Using flexible scope.     Current Meds  Medication Sig   amLODipine (NORVASC) 5 MG tablet  TAKE 1 TABLET BY MOUTH EVERY DAY   aspirin 81 MG chewable tablet Chew 1 tablet (81 mg total) by mouth 2 (two) times daily.   atorvastatin (LIPITOR) 40 MG tablet Take 1 tablet (40 mg total) by mouth daily.   docusate sodium (COLACE) 100 MG capsule Take 1 capsule (100 mg total) by mouth 2 (two) times daily. (Patient taking differently: Take 200 mg by mouth daily.)   fluticasone (FLONASE) 50 MCG/ACT nasal spray PLACE 1 SPRAY INTO BOTH NOSTRILS DAILY AS NEEDED FOR ALLERGIES OR RHINITIS.   hydrocortisone 2.5 % cream APPLY TO AFFECTED AREA TWICE A DAY AS NEEDED FOR RASH   lisinopril-hydrochlorothiazide (ZESTORETIC) 10-12.5 MG tablet Take 1  tablet by mouth daily.   meclizine (ANTIVERT) 25 MG tablet Take 25 mg by mouth daily as needed ([place]).   metoprolol tartrate (LOPRESSOR) 100 MG tablet Take 1 tablet (100 mg total) by mouth 2 (two) times daily.   Multiple Vitamin (MULTIVITAMIN WITH MINERALS) TABS tablet Take 1 tablet by mouth daily.   nicotine polacrilex ([employer] NICOTINE) 2 MG lozenge Take 1 lozenge (2 mg total) by mouth as needed for smoking cessation.   pantoprazole (PROTONIX) 40 MG tablet TAKE 1 TABLET BY MOUTH TWICE A DAY   sertraline (ZOLOFT) 50 MG tablet TAKE 1 TABLET BY MOUTH EVERY DAY   vitamin B-12 (CYANOCOBALAMIN) 1000 MCG tablet Take 1,000 mcg by mouth daily.     Allergies:   Formaldehyde   Social History   Tobacco Use   Smoking status: Former    Packs/day: 1.00    Years: 27.00    Total pack years: 27.00    Types: Cigarettes    Quit date: 04/18/1988    Years since quitting: 33.8   Smokeless tobacco: Former    Types: Chew    Quit date: 12/20/2021  Vaping Use   Vaping Use: Never used  Substance Use Topics   Alcohol use: Not Currently    Alcohol/week: 7.0 standard drinks of alcohol    Types: 7 Glasses of wine per week    Comment: weekly-Former heavy use ETOH   Drug use: No     Family Hx: The patient's family history includes Bipolar disorder in his son; Heart disease in his  son; Kidney disease in his daughter; Lung cancer in his father; Other in his mother; Sudden death in his son. There is no history of Prostate cancer or Bladder Cancer.  ROS:   Please see the history of present illness.     All other systems reviewed and are negative.   Prior CV studies:   The following studies were reviewed today:  Zio patch 07/2021: The patient was monitored for 13 days, 19 hours. The predominant rhythm was sinus with an average rate of 63 bpm (range 50-102 bpm in sinus). There were rare PACs and PVCs. One episode of nonsustained ventricular tachycardia was observed, lasting 5 beats with a maximum rate of 160 bpm. There were 78 atrial runs lasting up to 15 beats with a maximum rate of 171 bpm. No sustained arrhythmia (including atrial fibrillation/flutter) or prolonged pause was observed. Patient triggered event corresponds to sinus rhythm.   Predominantly sinus rhythm with rare PACs and PVCs.  Multiple episodes of brief PSVT noted as well as one short run of NSVT.  No atrial fibrillation/flutter identified. __________   2D echo 06/26/2021: 1. Left ventricular ejection fraction, by estimation, is 45 to 50%. The  left ventricle has mildly decreased function. The left ventricle  demonstrates global hypokinesis. There is moderate concentric left  ventricular hypertrophy. Left ventricular  diastolic parameters are indeterminate. Elevated left ventricular  end-diastolic pressure.   2. Right ventricular systolic function is normal. The right ventricular  size is normal. There is normal pulmonary artery systolic pressure.   3. Left atrial size was mildly dilated.   4. The mitral valve is normal in structure. Mild mitral valve  regurgitation. No evidence of mitral stenosis.   5. The aortic valve is tricuspid. There is mild calcification of the  aortic valve. Aortic valve regurgitation is not visualized. No aortic  stenosis is present.   6. Aortic dilatation noted. There  is mild dilatation of the aortic root,  measuring 41  mm. There is mild dilatation of the ascending aorta,  measuring 39 mm.   7. The inferior vena cava is dilated in size with <50% respiratory  variability, suggesting right atrial pressure of 15 mmHg. __________   Elwyn Reach patch 06/2019: The patient was monitored for 13 days, 19 hours. The predominant rhythm was sinus with an average rate of 74 bpm (range 54 to 120 bpm in sinus). Occasional PACs (~4% burden) and rare PVCs were noted. There were 125 episodes of paroxysmal supraventricular tachycardia lasting up to 17.5 seconds with a maximum rate of 187 bpm. No sustained arrhythmia or prolonged pause was identified. There were no patient triggered events.   Predominantly sinus rhythm with occasional PACs and rare PVCs.  Multiple episodes of PSVT noted, lasting up to 17.5 seconds. __________   2D echo 06/09/2019: 1. Left ventricular ejection fraction, by estimation, is 55 to 60%. The  left ventricle has normal function. The left ventricle has no regional  wall motion abnormalities. Left ventricular diastolic parameters were  normal.   2. Right ventricular systolic function is normal. The right ventricular  size is normal.   3. The mitral valve is normal in structure and function. Trivial mitral  valve regurgitation.   4. The aortic valve is normal in structure and function. Aortic valve  regurgitation is not visualized. __________   2D echo 03/29/2018: - Left ventricle: The cavity size was normal. Systolic function was    normal. The estimated ejection fraction was in the range of 60%    to 65%. Wall motion was normal; there were no regional wall    motion abnormalities. Left ventricular diastolic function    parameters were normal.  - Aortic valve: There was mild regurgitation.  - Left atrium: The atrium was mildly dilated.  - Right ventricle: Systolic function was normal.  - Atrial septum: Suspected small patent foramen ovale by  color flow    Doppler. .  - Pulmonary arteries: Systolic pressure was within the normal    range. __________   2D echo 10/05/2015: - Left ventricle: The cavity size was mildly dilated. There was    mild concentric hypertrophy. Systolic function was normal. The    estimated ejection fraction was in the range of 50% to 55%. Wall    motion was normal; there were no regional wall motion    abnormalities. Left ventricular diastolic function parameters    were normal.  - Aortic valve: There was trivial regurgitation.  - Mitral valve: There was mild regurgitation.  - Left atrium: The atrium was mildly dilated. __________   Carlton Adam MPI 10/02/2015: There was no ST segment deviation noted during stress. No T wave inversion was noted during stress. The study is normal. This is a low risk study. The left ventricular ejection fraction is mildly decreased (45-54%). EF appears close to normal visually. recommend an echocardiogram  Labs/Other Tests and Data Reviewed:    EKG:  No ECG reviewed.  Recent Labs: 06/28/2021: B Natriuretic Peptide 569.3; TSH 3.077 07/01/2021: Magnesium 1.9 08/24/2021: ALT 12; BUN 14; Creatinine, Ser 1.37; Hemoglobin 11.0; Platelets 250; Potassium 4.6; Sodium 140   Recent Lipid Panel Lab Results  Component Value Date/Time   CHOL 156 08/15/2019 08:32 AM   TRIG 144 08/15/2019 08:32 AM   HDL 46 08/15/2019 08:32 AM   CHOLHDL 4.4 06/09/2019 05:27 AM   LDLCALC 85 08/15/2019 08:32 AM    Wt Readings from Last 3 Encounters:  02/25/22 200 lb (90.7 kg)  12/28/21 208 lb (94.3 kg)  09/27/21 203 lb (92.1 kg)     Risk Assessment/Calculations:    CHA2DS2-VASc Score = 6  This indicates a 9.7% annual risk of stroke. The patient's score is based upon: CHF History: 0 HTN History: 1 Diabetes History: 0 Stroke History: 2 Vascular Disease History: 1 Age Score: 2 Gender Score: 0  HAS-BLED Score: 5, with an estimated rate of major bleeding with 1 year of OAC of 4.9 to  19.6%, high risk.      Objective:    Vital Signs:  BP (!) 160/99 (BP Location: Left Arm)   Pulse 61   Ht 5' 8"  (1.727 m)   Wt 200 lb (90.7 kg)   BMI 30.41 kg/m      ASSESSMENT & PLAN:    Persistent, lone A-fib: Noted during acute colitis illness with conversion to sinus rhythm in the hospital with IV amiodarone with subsequent outpatient cardiac monitoring showing no evidence of further A-fib.  Given his history of GI bleed, and in the context of spontaneous SDH, there were concerns about long-term OAC, therefore apixaban was discontinued.  HAS-BLED score places patient at high risk for bleeding on [place].  Given this, and in the context of lone A-fib in the setting of acute illness, he remains off [place].  Should he have recurrence of A-fib, would recommend referral to EP for consideration of [place].    Systolic dysfunction: He remains on metoprolol tartrate 100 mg twice daily.  Not requiring standing loop diuretic.  Trend echo.  Dizziness: Previously diagnosed with a history of vertigo.  Obtain CBC given history of GI bleed as well as BMP to ensure no significant dehydration.  Update echo as outlined above to evaluate for any new structural abnormalities.  No symptoms of tachypalpitations and patient prefers to not pursue further outpatient cardiac monitoring.  Possibly in the context of his history of CVA.  HTN: Blood pressure is elevated by home reading in the context of delayed antihypertensive therapy to see if this improves his dizziness.  Continue to monitor.  No changes in medical therapy at this time.  History of CVA: He has a history of both ischemic and hemorrhagic strokes.  No new deficits.  He remains on aspirin and atorvastatin.      Time:   Today, I have spent 8 minutes with the patient with telehealth technology discussing the above problems.     Medication Adjustments/Labs and Tests Ordered: Current medicines are reviewed at length with the patient today.  Concerns  regarding medicines are outlined above.   Tests Ordered: Orders Placed This Encounter  Procedures   CBC   Basic metabolic panel   ECHOCARDIOGRAM COMPLETE    Medication Changes: No orders of the defined types were placed in this encounter.   Follow Up:   After echo      Signed, Christell Faith, PA-C  02/25/2022 4:29 PM    [place]

## 2022-02-25 ENCOUNTER — Telehealth: Payer: Self-pay

## 2022-02-25 ENCOUNTER — Encounter: Payer: Self-pay | Admitting: Physician Assistant

## 2022-02-25 ENCOUNTER — Ambulatory Visit: Payer: Medicare Other | Attending: Physician Assistant | Admitting: Physician Assistant

## 2022-02-25 VITALS — BP 160/99 | HR 61 | Ht 68.0 in | Wt 200.0 lb

## 2022-02-25 DIAGNOSIS — K254 Chronic or unspecified gastric ulcer with hemorrhage: Secondary | ICD-10-CM | POA: Diagnosis not present

## 2022-02-25 DIAGNOSIS — Z87891 Personal history of nicotine dependence: Secondary | ICD-10-CM

## 2022-02-25 DIAGNOSIS — I25119 Atherosclerotic heart disease of native coronary artery with unspecified angina pectoris: Secondary | ICD-10-CM | POA: Diagnosis not present

## 2022-02-25 DIAGNOSIS — I4819 Other persistent atrial fibrillation: Secondary | ICD-10-CM | POA: Insufficient documentation

## 2022-02-25 DIAGNOSIS — I10 Essential (primary) hypertension: Secondary | ICD-10-CM | POA: Diagnosis not present

## 2022-02-25 DIAGNOSIS — Z8673 Personal history of transient ischemic attack (TIA), and cerebral infarction without residual deficits: Secondary | ICD-10-CM | POA: Diagnosis not present

## 2022-02-25 DIAGNOSIS — I519 Heart disease, unspecified: Secondary | ICD-10-CM | POA: Insufficient documentation

## 2022-02-25 DIAGNOSIS — R42 Dizziness and giddiness: Secondary | ICD-10-CM | POA: Insufficient documentation

## 2022-02-25 NOTE — Patient Instructions (Addendum)
Medication Instructions:  No changes at this time.   *If you need a refill on your cardiac medications before your next appointment, please call your pharmacy*   Lab Work: CBC & BMET. No appointment is needed. Go to the following location: Medical Mall Entrance at [facility] 1st desk on the right to check in (REGISTRATION)  Lab hours: Monday- Friday (7:30 am- 5:30 pm)  If you have labs (blood work) drawn today and your tests are completely normal, you will receive your results only by: [place] (if you have MyChart) OR A paper copy in the mail If you have any lab test that is abnormal or we need to change your treatment, we will call you to review the results.   Testing/Procedures: Your physician has requested that you have an echocardiogram. Echocardiography is a painless test that uses sound waves to create images of your heart. It provides your doctor with information about the size and shape of your heart and how well your heart's chambers and valves are working. This procedure takes approximately one hour. There are no restrictions for this procedure. Please do NOT wear cologne, perfume, aftershave, or lotions (deodorant is allowed). Please arrive 15 minutes prior to your appointment time.    Follow-Up: At [facility], you and your health needs are our priority.  As part of our continuing mission to provide you with exceptional heart care, we have created designated Provider Care Teams.  These Care Teams include your primary Cardiologist (physician) and Advanced Practice Providers (APPs -  Physician Assistants and Nurse Practitioners) who all work together to provide you with the care you need, when you need it.   Your next appointment:   Follow up after echocardiogram has been done.   The format for your next appointment:   In Person  Provider:   [name], MD or Christell Faith, PA-C       Important Information About Sugar

## 2022-02-25 NOTE — Telephone Encounter (Signed)
Patient Consent for Virtual Visit    Cameron Foster, you are scheduled for a virtual visit with your provider today.  Just as we do with appointments in the office, we must obtain your consent to participate.  Your consent will be active for this visit and any virtual visit you may have with one of our providers in the next 365 days.  If you have a MyChart account, I can also send a copy of this consent to you electronically.  All virtual visits are billed to your insurance company just like a traditional visit in the office.  As this is a virtual visit, video technology does not allow for your provider to perform a traditional examination.  This may limit your provider's ability to fully assess your condition.  If your provider identifies any concerns that need to be evaluated in person or the need to arrange testing such as labs, EKG, etc, we will make arrangements to do so.  Although advances in technology are sophisticated, we cannot ensure that it will always work on either your end or our end.  If the connection with a video visit is poor, we may have to switch to a telephone visit.  With either a video or telephone visit, we are not always able to ensure that we have a secure connection.   I need to obtain your verbal consent now.   Are you willing to proceed with your visit today?        :[id number]}     Cameron Foster. has provided verbal consent on 02/25/2022 for a virtual visit (video or telephone).   CONSENT FOR VIRTUAL VISIT FOR:  Cameron Foster.  By participating in this virtual visit I agree to the following:  I hereby voluntarily request, consent and authorize [facility] and its employed or contracted physicians, physician assistants, nurse practitioners or other licensed health care professionals (the Practitioner), to provide me with telemedicine health care services (the "Services") as deemed necessary by the treating Practitioner. I acknowledge and consent to receive the  Services by the Practitioner via telemedicine. I understand that the telemedicine visit will involve communicating with the Practitioner through live audiovisual communication technology and the disclosure of certain medical information by electronic transmission. I acknowledge that I have been given the opportunity to request an in-person assessment or other available alternative prior to the telemedicine visit and am voluntarily participating in the telemedicine visit.  I understand that I have the right to withhold or withdraw my consent to the use of telemedicine in the course of my care at any time, without affecting my right to future care or treatment, and that the Practitioner or I may terminate the telemedicine visit at any time. I understand that I have the right to inspect all information obtained and/or recorded in the course of the telemedicine visit and may receive copies of available information for a reasonable fee.  I understand that some of the potential risks of receiving the Services via telemedicine include:  Delay or interruption in medical evaluation due to technological equipment failure or disruption; Information transmitted may not be sufficient (e.g. poor resolution of images) to allow for appropriate medical decision making by the Practitioner; and/or  In rare instances, security protocols could fail, causing a breach of personal health information.  Furthermore, I acknowledge that it is my responsibility to provide information about my medical history, conditions and care that is complete and accurate to the best of my ability. I  acknowledge that Practitioner's advice, recommendations, and/or decision may be based on factors not within their control, such as incomplete or inaccurate data provided by me or distortions of diagnostic images or specimens that may result from electronic transmissions. I understand that the practice of medicine is not an exact science and that  Practitioner makes no warranties or guarantees regarding treatment outcomes. I acknowledge that a copy of this consent can be made available to me via my patient portal ([facility]), or I can request a printed copy by calling the office of [facility].    I understand that my insurance will be billed for this visit.   I have read or had this consent read to me. I understand the contents of this consent, which adequately explains the benefits and risks of the Services being provided via telemedicine.  I have been provided ample opportunity to ask questions regarding this consent and the Services and have had my questions answered to my satisfaction. I give my informed consent for the services to be provided through the use of telemedicine in my medical care

## 2022-03-05 ENCOUNTER — Other Ambulatory Visit: Payer: Self-pay | Admitting: Family Medicine

## 2022-03-05 ENCOUNTER — Other Ambulatory Visit: Payer: Self-pay | Admitting: Internal Medicine

## 2022-03-05 DIAGNOSIS — I110 Hypertensive heart disease with heart failure: Secondary | ICD-10-CM

## 2022-03-05 DIAGNOSIS — I10 Essential (primary) hypertension: Secondary | ICD-10-CM

## 2022-03-07 NOTE — Telephone Encounter (Signed)
Refilled 10/29/2021 #180 1 rf - same pharm. Requested Prescriptions  Pending Prescriptions Disp Refills   metoprolol tartrate (LOPRESSOR) 100 MG tablet [Pharmacy Med Name: METOPROLOL TARTRATE 100 MG TAB] 180 tablet 1    Sig: TAKE 1 TABLET BY MOUTH TWICE A DAY     Cardiovascular:  Beta Blockers Failed - 03/05/2022 10:30 AM      Failed - Last BP in normal range    BP Readings from Last 1 Encounters:  02/25/22 (!) 160/99         Passed - Last Heart Rate in normal range    Pulse Readings from Last 1 Encounters:  02/25/22 61         Passed - Valid encounter within last 6 months    Recent Outpatient Visits           2 months ago Ataxia, late effect of cerebrovascular disease   [facility] [place], Kirstie Peri, MD   4 months ago Essential hypertension   [employer], [place], PA-C   8 months ago Essential hypertension   [facility] [place], Dionne Bucy, MD   8 months ago Depression, major, single episode, mild [facility])   [employer], Erin E, PA-C   9 months ago Acute recurrent sinusitis, unspecified location   [employer], Dani Gobble, PA-C       Future Appointments             In 3 weeks [name], MD [facility]   In [place], [place]. [place]

## 2022-03-08 DIAGNOSIS — M25551 Pain in right hip: Secondary | ICD-10-CM | POA: Diagnosis not present

## 2022-03-08 DIAGNOSIS — M25651 Stiffness of right hip, not elsewhere classified: Secondary | ICD-10-CM | POA: Diagnosis not present

## 2022-03-08 DIAGNOSIS — R2689 Other abnormalities of gait and mobility: Secondary | ICD-10-CM | POA: Diagnosis not present

## 2022-03-17 DIAGNOSIS — I129 Hypertensive chronic kidney disease with stage 1 through stage 4 chronic kidney disease, or unspecified chronic kidney disease: Secondary | ICD-10-CM

## 2022-03-17 DIAGNOSIS — F17210 Nicotine dependence, cigarettes, uncomplicated: Secondary | ICD-10-CM

## 2022-03-17 DIAGNOSIS — F32A Depression, unspecified: Secondary | ICD-10-CM

## 2022-03-17 DIAGNOSIS — I4891 Unspecified atrial fibrillation: Secondary | ICD-10-CM | POA: Diagnosis not present

## 2022-03-17 DIAGNOSIS — N1831 Chronic kidney disease, stage 3a: Secondary | ICD-10-CM | POA: Diagnosis not present

## 2022-03-17 DIAGNOSIS — E785 Hyperlipidemia, unspecified: Secondary | ICD-10-CM | POA: Diagnosis not present

## 2022-03-22 ENCOUNTER — Ambulatory Visit (INDEPENDENT_AMBULATORY_CARE_PROVIDER_SITE_OTHER): Payer: Medicare Other

## 2022-03-22 DIAGNOSIS — M25551 Pain in right hip: Secondary | ICD-10-CM | POA: Diagnosis not present

## 2022-03-22 DIAGNOSIS — I10 Essential (primary) hypertension: Secondary | ICD-10-CM

## 2022-03-22 DIAGNOSIS — R2689 Other abnormalities of gait and mobility: Secondary | ICD-10-CM | POA: Diagnosis not present

## 2022-03-22 DIAGNOSIS — I69993 Ataxia following unspecified cerebrovascular disease: Secondary | ICD-10-CM

## 2022-03-22 DIAGNOSIS — M25651 Stiffness of right hip, not elsewhere classified: Secondary | ICD-10-CM | POA: Diagnosis not present

## 2022-03-22 NOTE — Patient Instructions (Signed)
Visit Information It was great speaking with you today!  Please let me know if you have any questions about our visit.   Goals Addressed             This Visit's Progress    Manage My Medicine   On track    Timeframe:  Long-Range Goal Priority:  High Start Date: 02/25/2021                            Expected End Date: 02/25/2022                      Follow Up within 90 days   - call for medicine refill 2 or 3 days before it runs out - keep a list of all the medicines I take; vitamins and herbals too - use a pillbox to sort medicine    Why is this important?   These steps will help you keep on track with your medicines.   Notes:         Patient Care Plan: General Pharmacy (Adult)     Problem Identified: Hypertension, Hyperlipidemia, Atrial Fibrillation, Chronic Kidney Disease, Depression, Osteoarthritis, and History of TIA, CVA   Priority: High     Long-Range Goal: Patient-Specific Goal   Start Date: 02/25/2021  Expected End Date: 01/25/2023  This Visit's Progress: On track  Recent Progress: On track  Priority: High  Note:   Current Barriers:  Unable to self administer medications as prescribed  Pharmacist Clinical Goal(s):  Patient will maintain control of blood pressure as evidenced by BP less than 140/90  through collaboration with PharmD and provider.   Interventions: 1:1 collaboration with [name], MD regarding development and update of comprehensive plan of care as evidenced by provider attestation and co-signature Inter-disciplinary care team collaboration (see longitudinal plan of care) Comprehensive medication review performed; medication list updated in electronic medical record  Hypertension (BP goal <140/90) -Controlled -Current treatment: Metoprolol tartrate 100 mg twice daily: Appropriate, Effective, Safe, Accessible  Lisinopril-HCTZ 20-25 mg daily: Appropriate, Effective, Safe, Accessible  -Medications previously tried: Atenolol,  Bisoprolol, nebivolol, amlodipine, lisinopril  -Current home readings: 130/85 -Current dietary habits: Eats what I want to eat. 1 cup of coffee. Ocassional sweet tea or regular soda. Eats out large meal once weekly. Does salt food.  -Current exercise habits: None Still feels some unsteadiness in legs, but dizziness has improved overall.  -Recommended to continue current medication  Atrial Fibrillation (Goal: prevent stroke and major bleeding) -Controlled -CHADSVASC: 7  -Current treatment: Rate control:  Metoprolol 100 mg twice daily  Anticoagulation: Aspirin 81 mg twice daily  -Medications previously tried: NA -Recommended to continue current medication  Hyperlipidemia: (LDL goal < 70) -Uncontrolled -History of TIA, CVA  -Current treatment: Atorvastatin 40 mg daily (AM) -Current treatment: Aspirin 81 mg twice daily (AM) -Medications previously tried: NA  -Recommended to continue current medication  Depression (Goal: Maintain symptom remission) -Controlled -Current treatment: Sertraline 50 mg daily  -Medications previously tried/failed: NA -PHQ9: 3 -Recommended to continue current medication  GERD (Goal: Prevent Reflux) -Controlled History of stomach ulcer  -Current treatment  Pantoprazole 40 mg twice daily: Query Appropriate  -Medications previously tried: NA  -Query appropriate use of twice daily pantoprazole, defer potential changes for now.  -Recommended to continue current medication  Chronic Kidney Disease Stage 3a  -All medications assessed for renal dosing and appropriateness in chronic kidney disease. -Recommended to continue current medication  Tobacco use (Goal Quit smoking) -On track  -Previous quit attempts: Nicotine Patch (Ineffective)  -Current treatment  Nicotine 2 mg lozenge as needed  -Patient smokes After 30 minutes of waking -Patient triggers include: finishing a meal and needing to do something with your hands/mouth -Patient is in an action  stage of smoking cessation. He quit using tobacco products on 12/20/21.  -Continues to utilize nicotine lozenges ~1x daily. Reports doing well his avoidance of tobacco products. Congratulated patient on reaching 3 months of being tobacco free. -Continue current medications   Patient Goals/Self-Care Activities Patient will:  - check blood pressure weekly, document, and provide at future appointments  Follow Up Plan: Telephone follow up appointment with care management team member scheduled for:  06/27/2022 at 10:00 AM    Patient agreed to services and verbal consent obtained.   Patient verbalizes understanding of instructions and care plan provided today and agrees to view in [place]. Active MyChart status and patient understanding of how to access instructions and care plan via MyChart confirmed with patient.     Junius Argyle, PharmD, Para [place], CPP  Clinical Pharmacist Practitioner  [facility] [phone]

## 2022-03-22 NOTE — Progress Notes (Signed)
Chronic Care Management Pharmacy Note  03/22/2022 Name:  Cameron Foster. MRN:  [id number] DOB:  15-Oct-1940  Summary: Patient presents for CCM follow-up.   Still feels some unsteadiness in legs, but dizziness has improved overall.   Patient is 3 months tobacco free. Feels his sinus symptoms have improved. Continues to utilize nicotine lozenges ~1x daily. Reports doing well his avoidance of tobacco products. Congratulated patient on reaching 3 months of being tobacco free.  Recommendations/Changes made from today's visit: Continue current medications  Plan: CPP follow-up 3 months  Subjective: Cameron Westman. is an 81 y.o. year old male who is a primary patient of Bacigalupo, Dionne Bucy, MD.  The CCM team was consulted for assistance with disease management and care coordination needs.    Engaged with patient by telephone for follow up visit in response to provider referral for pharmacy case management and/or care coordination services.   Consent to Services:  The patient was given information about Chronic Care Management services, agreed to services, and gave verbal consent prior to initiation of services.  Please see initial visit note for detailed documentation.   Patient Care Team: [name], MD as PCP - General (Family Medicine) End, Harrell Gave, MD as PCP - Cardiology (Cardiology) End, Harrell Gave, MD as Consulting Physician (Cardiology) [name], MD (Orthopedic Surgery) Schnier, Dolores Lory, MD (Vascular Surgery) [name], MD (Dermatology) [name], MD (Inactive) as Consulting Physician (Ophthalmology) [name], [facility] (Pharmacist)  Recent office visits:  07/09/21: patient presented to [name] for hospital follow-up.  07/06/21: Patient presented to [facility], PA-C for follow-up. Docusate, hydrocortisone, melatonine, Miralax, thiamine stopped.   Recent consult visits: 02/25/22: Patient presented to Christell Faith, PA-C for  follow-up.  08/17/21: Dr. Saunders Revel recommended stopping Eliquis, starting Aspirin 81 mg daily.  08/12/21: Patient presented to Dr. Deloria Lair for headache. BP 190/113. Lisinopril 10 mg daily.  07/22/21: Patient presented to Dr. Saunders Revel for follow-up. Srop amiodarone.   Hospital visits: None in past 6 months   Objective:  Lab Results  Component Value Date   CREATININE 1.37 (H) 08/24/2021   BUN 14 08/24/2021   GFR 57.85 (L) 02/21/2018   GFRNONAA 45 (L) 07/03/2021   GFRAA 62 08/15/2019   NA 140 08/24/2021   K 4.6 08/24/2021   CALCIUM 9.4 08/24/2021   CO2 27 08/24/2021   GLUCOSE 103 (H) 08/24/2021    Lab Results  Component Value Date/Time   HGBA1C 5.4 09/24/2020 03:12 PM   HGBA1C 5.6 06/09/2019 05:27 AM   HGBA1C 5.3 10/11/2018 12:00 AM   GFR 57.85 (L) 02/21/2018 11:56 AM   GFR 61.28 04/19/2017 03:50 PM    Last diabetic Eye exam: No results found for: "HMDIABEYEEXA"  Last diabetic Foot exam: No results found for: "HMDIABFOOTEX"   Lab Results  Component Value Date   CHOL 156 08/15/2019   HDL 46 08/15/2019   LDLCALC 85 08/15/2019   TRIG 144 08/15/2019   CHOLHDL 4.4 06/09/2019       Latest Ref Rng & Units 08/24/2021    2:16 PM 07/15/2021    3:16 PM 06/21/2021    8:35 PM  Hepatic Function  Total Protein 6.0 - 8.5 g/dL 6.9  6.4  7.6   Albumin 3.7 - 4.7 g/dL 4.0  3.7  3.9   AST 0 - 40 IU/L _0 ALT 0 - 44 IU/L _1 Alk Phosphatase 44 - 121 IU/L 71  80  79   Total Bilirubin 0.0 - 1.2 mg/dL 0.3  0.3  0.7     Lab Results  Component Value Date/Time   TSH 3.077 06/28/2021 04:26 AM   TSH 5.605 (H) 10/20/2018 09:46 PM   TSH 1.88 01/20/2015 11:22 AM   FREET4 0.66 10/21/2018 01:29 PM       Latest Ref Rng & Units 08/24/2021    2:16 PM 07/15/2021    3:16 PM 07/03/2021    6:14 AM  CBC  WBC 3.4 - 10.8 x10E3/uL 7.5  9.0  10.4   Hemoglobin 13.0 - 17.7 g/dL 11.0  10.7  10.5   Hematocrit 37.5 - 51.0 % 34.1  33.1  33.4   Platelets 150 - 450 x10E3/uL 250  310  377     No results  found for: "VD25OH"  Clinical ASCVD: Yes  The ASCVD Risk score (Arnett DK, et al., 2019) failed to calculate for the following reasons:   The 2019 ASCVD risk score is only valid for ages 57 to 38   The patient has a prior MI or stroke diagnosis       12/28/2021    1:58 PM 07/06/2021    1:10 PM 06/08/2021    3:04 PM  Depression screen PHQ 2/9  Decreased Interest _0 Down, Depressed, Hopeless 0 1 2  PHQ - 2 Score _1 Altered sleeping _2 Tired, decreased energy _3 Change in appetite 0 0 0  Feeling bad or failure about yourself  1 0 0  Trouble concentrating 3 0 0  Moving slowly or fidgety/restless 0 0 0  Suicidal thoughts 0 0 0  PHQ-9 Score _4 Difficult doing work/chores Not difficult at all Not difficult at all Extremely dIfficult    Social History   Tobacco Use  Smoking Status Former   Packs/day: 1.00   Years: 27.00   Total pack years: 27.00   Types: Cigarettes   Quit date: 04/18/1988   Years since quitting: 33.9  Smokeless Tobacco Former   Types: Chew   Quit date: 12/20/2021   BP Readings from Last 3 Encounters:  02/25/22 (!) 160/99  12/28/21 108/60  10/13/21 (!) 146/84   Pulse Readings from Last 3 Encounters:  02/25/22 61  12/28/21 61  10/13/21 65   Wt Readings from Last 3 Encounters:  02/25/22 200 lb (90.7 kg)  12/28/21 208 lb (94.3 kg)  09/27/21 203 lb (92.1 kg)   BMI Readings from Last 3 Encounters:  02/25/22 30.41 kg/m  12/28/21 31.63 kg/m  09/27/21 30.87 kg/m    Assessment/Interventions: Review of patient past medical history, allergies, medications, health status, including review of consultants reports, laboratory and other test data, was performed as part of comprehensive evaluation and provision of chronic care management services.   SDOH:  (Social Determinants of Health) assessments and interventions performed: Yes SDOH Interventions    Flowsheet Row Chronic Care Management from 01/21/2022 in [facility] Most  recent reading at 01/24/2022 10:42 AM Video Visit from 07/06/2021 in [facility] Most recent reading at 07/06/2021  1:10 PM Chronic Care Management from 07/06/2021 in [facility] Most recent reading at 07/06/2021 11:48 AM Chronic Care Management from 05/31/2021 in [facility] Most recent reading at 06/16/2021 10:13 AM Office Visit from 06/08/2021 in [facility] Most recent reading at 06/08/2021  3:04 PM Clinical Support from 05/24/2021 in [facility] Most recent reading at 05/24/2021  2:00  PM  SDOH Interventions        Food Insecurity Interventions -- -- -- -- -- Intervention Not Indicated  Housing Interventions -- -- -- -- -- Intervention Not Indicated  Transportation Interventions -- -- -- -- -- Intervention Not Indicated  Depression Interventions/Treatment  -- Medication -- -- Currently on Treatment --  Financial Strain Interventions Intervention Not Indicated -- Intervention Not Indicated Intervention Not Indicated -- Intervention Not Indicated  Physical Activity Interventions -- -- -- -- -- Patient Refused  Stress Interventions -- -- -- -- -- Intervention Not Indicated  Social Connections Interventions -- -- -- -- -- Intervention Not Indicated         SDOH Screenings   Food Insecurity: No Food Insecurity (05/24/2021)  Housing: Low Risk  (05/24/2021)  Transportation Needs: No Transportation Needs (05/24/2021)  Alcohol Screen: Low Risk  (05/24/2021)  Depression (PHQ2-9): High Risk (12/28/2021)  Financial Resource Strain: Low Risk  (01/24/2022)  Physical Activity: Inactive (05/24/2021)  Social Connections: Moderately Integrated (05/24/2021)  Stress: No Stress Concern Present (05/24/2021)  Tobacco Use: Medium Risk (02/25/2022)    CCM Care Plan  Allergies  Allergen Reactions   Formaldehyde Rash    From shoes made with this material    Medications Reviewed Today     Reviewed by Sindy Messing (Physician Assistant Certified)  on 99/37/16 at [place] List Status: <None>   Medication Order Taking? Sig Documenting Provider Last Dose Status Informant  amLODipine (NORVASC) 5 MG tablet [id number] Yes TAKE 1 TABLET BY MOUTH EVERY DAY Birdie Sons, MD Taking Active   aspirin 81 MG chewable tablet [id number] Yes Chew 1 tablet (81 mg total) by mouth 2 (two) times daily. Carlynn Spry, PA-C Taking Active   atorvastatin (LIPITOR) 40 MG tablet [id number] Yes Take 1 tablet (40 mg total) by mouth daily. Gwyneth Sprout, FNP Taking Active   docusate sodium (COLACE) 100 MG capsule [id number] Yes Take 1 capsule (100 mg total) by mouth 2 (two) times daily.  Patient taking differently: Take 200 mg by mouth daily.   Carlynn Spry, PA-C Taking Active   fluticasone (FLONASE) 50 MCG/ACT nasal spray [id number] Yes PLACE 1 SPRAY INTO BOTH NOSTRILS DAILY AS NEEDED FOR ALLERGIES OR RHINITIS. [name], MD Taking Active   Fluticasone-Umeclidin-[facility] ELLIPTA) 100-62.5-25 MCG/ACT AEPB [id number] No Inhale 1 puff into the lungs daily.  Patient not taking: Reported on 02/25/2022   Birdie Sons, MD Not Taking Active   hydrocortisone 2.5 % cream [id number] Yes APPLY TO AFFECTED AREA TWICE A DAY AS NEEDED FOR RASH Myles Gip, DO Taking Active   lisinopril-hydrochlorothiazide (ZESTORETIC) 10-12.5 MG tablet [id number] Yes Take 1 tablet by mouth daily. Birdie Sons, MD Taking Active   meclizine (ANTIVERT) 25 MG tablet [id number] Yes Take 25 mg by mouth daily as needed (Vertigo). [provider] Taking Active Self  metoprolol tartrate (LOPRESSOR) 100 MG tablet [id number] Yes Take 1 tablet (100 mg total) by mouth 2 (two) times daily. Gwyneth Sprout, FNP Taking Active   Multiple Vitamin (MULTIVITAMIN WITH MINERALS) TABS tablet [id number] Yes Take 1 tablet by mouth daily. Vaughan Basta, MD Taking Active Self  nicotine (NICODERM CQ - DOSED IN MG/24 HOURS) 21 mg/24hr patch [id number] No Place 1 patch (21 mg total)  onto the skin daily.  Patient not taking: Reported on 02/25/2022   [name], MD Not Taking Active   nicotine polacrilex ([facility] NICOTINE) 2 MG lozenge [id number] Yes Take 1 lozenge (2 mg total) by  mouth as needed for smoking cessation. [name], MD Taking Consider Medication Status and Discontinue   pantoprazole (PROTONIX) 40 MG tablet [id number] Yes TAKE 1 TABLET BY MOUTH TWICE A DAY [name], MD Taking Active   sertraline (ZOLOFT) 50 MG tablet [id number] Yes TAKE 1 TABLET BY MOUTH EVERY DAY Birdie Sons, MD Taking Active   vitamin B-12 (CYANOCOBALAMIN) 1000 MCG tablet [id number] Yes Take 1,000 mcg by mouth daily. [provider] Taking Active Self            Patient Active Problem List   Diagnosis Date Noted   Ataxia, 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   Tobacco abuse 12/21/2021   Chronic bronchitis ([place]) 10/13/2021   History of total hip replacement, right 09/27/2021   Persistent atrial fibrillation ([place])    Atrial fibrillation with rapid ventricular response ([place])    Acute clinical systolic heart failure ([place]) 06/26/2021   Left sided colitis ([place]) 06/23/2021   Actinic keratosis 05/24/2021   Squamous cell cancer of skin of left forearm 05/24/2021   Chronic gastric ulcer with hemorrhage 02/25/2021   Irregular Z line of esophagus 02/25/2021   Chronic pansinusitis 01/26/2021   Dizziness 01/26/2021   Chronic cough 01/26/2021   Melena 01/13/2021   Alcohol use disorder, moderate, dependence ([place]) 01/13/2021   Stage 3a chronic kidney disease ([place]) 11/23/2020   Chronic pain of right ankle 05/21/2020   Achilles tendinitis 04/30/2020   Synovitis and tenosynovitis 04/30/2020   Lymphedema 03/03/2020   Chronic venous insufficiency 03/03/2020   Fatigue 02/14/2020   Leg edema 02/14/2020   Foraminal stenosis of lumbar region 11/08/2019   Chronic bilateral low back pain without sciatica 10/18/2019   Foraminal stenosis of cervical  region 10/18/2019   PSVT (paroxysmal supraventricular tachycardia) 08/01/2019   History of lacunar cerebrovascular accident (CVA) 07/10/2019   History of stroke 07/01/2019   Paroxysmal atrial fibrillation ([place]) 11/07/2018   TIA (transient ischemic attack) 10/20/2018   History of subarachnoid hemorrhage 10/16/2018   Low libido 08/30/2018   Obesity 05/14/2018   Hyperlipidemia LDL goal <70 05/14/2018   Degeneration of lumbar intervertebral disc 03/22/2018   Osteoarthritis of hip 03/22/2018   Trochanteric bursitis of right hip 03/22/2018   History of colostomy reversal 03/21/2017   Status post partial colectomy 01/03/2017   Depression, major, single episode, mild ([place]) 01/03/2017   Vasovagal syncope 01/27/2016   History of skin cancer 10/28/2015   Osteoarthritis of right knee 10/01/2015   Essential hypertension 08/25/2015   Dyspnea on exertion 08/12/2015   Erectile dysfunction following radiation therapy 08/04/2015   Constipation 08/02/2015   History of prostate cancer 01/20/2015   Swelling of right lower extremity 01/20/2015    Immunization History  Administered Date(s) Administered   COVID-19, mRNA, vaccine(Comirnaty)12 years and older 01/24/2022   Fluad Quad(high Dose 65+) 01/14/2020, 01/26/2021, 12/28/2021   Influenza, High Dose Seasonal PF 01/26/2017, 02/01/2018, 01/31/2019   Influenza,inj,Quad PF,6+ Mos 02/08/2016   Influenza,inj,quad, With Preservative 12/18/2014   PFIZER Comirnaty(Gray Top)Covid-19 Tri-Sucrose Vaccine 08/24/2020   PFIZER(Purple Top)SARS-COV-2 Vaccination 05/31/2019, 06/25/2019, 01/22/2020   Pneumococcal Conjugate-13 05/14/2018   Pneumococcal Polysaccharide-23 12/18/2014   Pneumococcal-Unspecified 12/18/2014   Tdap 01/14/2020   Zoster Recombinat (Shingrix) 01/24/2022    Conditions to be addressed/monitored:  Hypertension, Hyperlipidemia, Atrial Fibrillation, Chronic Kidney Disease, Depression, Osteoarthritis, and History of TIA, CVA   Care Plan :  General Pharmacy (Adult)  Updates made by [name], RPH since 03/22/2022 12:00 AM     Problem: Hypertension, Hyperlipidemia, Atrial Fibrillation,  Chronic Kidney Disease, Depression, Osteoarthritis, and History of TIA, CVA   Priority: High     Long-Range Goal: Patient-Specific Goal   Start Date: 02/25/2021  Expected End Date: 01/25/2023  This Visit's Progress: On track  Recent Progress: On track  Priority: High  Note:   Current Barriers:  Unable to self administer medications as prescribed  Pharmacist Clinical Goal(s):  Patient will maintain control of blood pressure as evidenced by BP less than 140/90  through collaboration with PharmD and provider.   Interventions: 1:1 collaboration with [name], MD regarding development and update of comprehensive plan of care as evidenced by provider attestation and co-signature Inter-disciplinary care team collaboration (see longitudinal plan of care) Comprehensive medication review performed; medication list updated in electronic medical record  Hypertension (BP goal <140/90) -Controlled -Current treatment: Metoprolol tartrate 100 mg twice daily: Appropriate, Effective, Safe, Accessible  Lisinopril-HCTZ 20-25 mg daily: Appropriate, Effective, Safe, Accessible  -Medications previously tried: Atenolol, Bisoprolol, nebivolol, amlodipine, lisinopril  -Current home readings: 130/85 -Current dietary habits: Eats what I want to eat. 1 cup of coffee. Ocassional sweet tea or regular soda. Eats out large meal once weekly. Does salt food.  -Current exercise habits: None Still feels some unsteadiness in legs, but dizziness has improved overall.  -Recommended to continue current medication  Atrial Fibrillation (Goal: prevent stroke and major bleeding) -Controlled -CHADSVASC: 7  -Current treatment: Rate control:  Metoprolol 100 mg twice daily  Anticoagulation: Aspirin 81 mg twice daily  -Medications previously tried:  NA -Recommended to continue current medication  Hyperlipidemia: (LDL goal < 70) -Uncontrolled -History of TIA, CVA  -Current treatment: Atorvastatin 40 mg daily (AM) -Current treatment: Aspirin 81 mg twice daily (AM) -Medications previously tried: NA  -Recommended to continue current medication  Depression (Goal: Maintain symptom remission) -Controlled -Current treatment: Sertraline 50 mg daily  -Medications previously tried/failed: NA -PHQ9: 3 -Recommended to continue current medication  GERD (Goal: Prevent Reflux) -Controlled History of stomach ulcer  -Current treatment  Pantoprazole 40 mg twice daily: Query Appropriate  -Medications previously tried: NA  -Query appropriate use of twice daily pantoprazole, defer potential changes for now.  -Recommended to continue current medication  Chronic Kidney Disease Stage 3a  -All medications assessed for renal dosing and appropriateness in chronic kidney disease. -Recommended to continue current medication  Tobacco use (Goal Quit smoking) -On track  -Previous quit attempts: Nicotine Patch (Ineffective)  -Current treatment  Nicotine 2 mg lozenge as needed  -Patient smokes After 30 minutes of waking -Patient triggers include: finishing a meal and needing to do something with your hands/mouth -Patient is in an action stage of smoking cessation. He quit using tobacco products on 12/20/21.  -Continues to utilize nicotine lozenges ~1x daily. Reports doing well his avoidance of tobacco products. Congratulated patient on reaching 3 months of being tobacco free. -Continue current medications   Patient Goals/Self-Care Activities Patient will:  - check blood pressure weekly, document, and provide at future appointments  Follow Up Plan: Telephone follow up appointment with care management team member scheduled for:  06/27/2022 at 10:00 AM    Medication Assistance: None required.  Patient affirms current coverage meets  needs.  Compliance/Adherence/Medication fill history: Care Gaps: Shingrix Vaccine Pneumonia Vaccine COVID-19 Vaccine  Star-Rating Drugs: Atorvastatin 40 mg last filled [facility]06[facility]. Lisinopril 10 mg daily last filled 08/12/21 for 90-DS  [facility]  [id number] [place] [place]: [phone]  Fax: [phone]  Uses pill box? Yes Pt endorses 100% compliance  We discussed: Current pharmacy is preferred with insurance plan and patient is satisfied with pharmacy services Patient decided to: Continue current medication management strategy  Care Plan and Follow Up Patient Decision:  Patient agrees to Care Plan and Follow-up.  Plan: Telephone follow up appointment with care management team member scheduled for:  06/27/2022 at 10:00 AM  Junius Argyle, PharmD, [facility] [place], [place] [phone]

## 2022-03-28 ENCOUNTER — Ambulatory Visit: Payer: Medicare Other | Admitting: Dermatology

## 2022-03-29 ENCOUNTER — Ambulatory Visit: Payer: Medicare Other | Admitting: Dermatology

## 2022-03-29 DIAGNOSIS — R2689 Other abnormalities of gait and mobility: Secondary | ICD-10-CM | POA: Diagnosis not present

## 2022-03-29 DIAGNOSIS — M25651 Stiffness of right hip, not elsewhere classified: Secondary | ICD-10-CM | POA: Diagnosis not present

## 2022-03-29 DIAGNOSIS — M25551 Pain in right hip: Secondary | ICD-10-CM | POA: Diagnosis not present

## 2022-03-31 ENCOUNTER — Other Ambulatory Visit: Payer: Self-pay | Admitting: Family Medicine

## 2022-03-31 DIAGNOSIS — I5021 Acute systolic (congestive) heart failure: Secondary | ICD-10-CM

## 2022-03-31 DIAGNOSIS — F320 Major depressive disorder, single episode, mild: Secondary | ICD-10-CM

## 2022-03-31 DIAGNOSIS — I10 Essential (primary) hypertension: Secondary | ICD-10-CM

## 2022-03-31 DIAGNOSIS — N1831 Chronic kidney disease, stage 3a: Secondary | ICD-10-CM

## 2022-03-31 DIAGNOSIS — I4819 Other persistent atrial fibrillation: Secondary | ICD-10-CM

## 2022-04-08 ENCOUNTER — Telehealth: Payer: Self-pay

## 2022-04-08 NOTE — Telephone Encounter (Signed)
   CCM RN Visit Note   04/08/22 Name: Cameron Foster. MRN: [id number]      DOB: 11-19-1940  Subjective: Cameron Foster. is a 81 y.o. year old male who is a primary care patient of Bacigalupo, Dionne Bucy, MD. The patient was referred to the Chronic Care Management team for assistance with care management needs subsequent to provider initiation of CCM services and plan of care.      Successful outreach with Cameron Foster today. Discussed referral for CCM services. Cameron Foster requested to call back with available date/time for telephonic assessment.   PLAN: Cameron Foster will call to confirm availability and schedule outreach.   Horris Latino RN Care Manager/Chronic Care Management [phone]

## 2022-04-14 ENCOUNTER — Ambulatory Visit: Payer: PRIVATE HEALTH INSURANCE

## 2022-04-14 ENCOUNTER — Encounter: Payer: Self-pay | Admitting: Dermatology

## 2022-04-14 ENCOUNTER — Ambulatory Visit (INDEPENDENT_AMBULATORY_CARE_PROVIDER_SITE_OTHER): Payer: Medicare Other | Admitting: Dermatology

## 2022-04-14 ENCOUNTER — Telehealth: Payer: Self-pay

## 2022-04-14 VITALS — BP 92/51 | HR 65

## 2022-04-14 DIAGNOSIS — L570 Actinic keratosis: Secondary | ICD-10-CM | POA: Diagnosis not present

## 2022-04-14 DIAGNOSIS — L820 Inflamed seborrheic keratosis: Secondary | ICD-10-CM

## 2022-04-14 DIAGNOSIS — L821 Other seborrheic keratosis: Secondary | ICD-10-CM | POA: Diagnosis not present

## 2022-04-14 DIAGNOSIS — Z85828 Personal history of other malignant neoplasm of skin: Secondary | ICD-10-CM

## 2022-04-14 DIAGNOSIS — L853 Xerosis cutis: Secondary | ICD-10-CM

## 2022-04-14 DIAGNOSIS — L578 Other skin changes due to chronic exposure to nonionizing radiation: Secondary | ICD-10-CM | POA: Diagnosis not present

## 2022-04-14 DIAGNOSIS — I25119 Atherosclerotic heart disease of native coronary artery with unspecified angina pectoris: Secondary | ICD-10-CM | POA: Diagnosis not present

## 2022-04-14 DIAGNOSIS — L814 Other melanin hyperpigmentation: Secondary | ICD-10-CM

## 2022-04-14 DIAGNOSIS — I4819 Other persistent atrial fibrillation: Secondary | ICD-10-CM

## 2022-04-14 DIAGNOSIS — D229 Melanocytic nevi, unspecified: Secondary | ICD-10-CM | POA: Diagnosis not present

## 2022-04-14 DIAGNOSIS — I10 Essential (primary) hypertension: Secondary | ICD-10-CM

## 2022-04-14 DIAGNOSIS — Z1283 Encounter for screening for malignant neoplasm of skin: Secondary | ICD-10-CM

## 2022-04-14 DIAGNOSIS — I5021 Acute systolic (congestive) heart failure: Secondary | ICD-10-CM

## 2022-04-14 DIAGNOSIS — L299 Pruritus, unspecified: Secondary | ICD-10-CM

## 2022-04-14 NOTE — Telephone Encounter (Signed)
   CCM RN Visit Note   04/14/22 Name: Cameron Foster. MRN: [id number]      DOB: 1940/08/27  Subjective: Cameron Foster. is a 81 y.o. year old male who is a primary care patient of Bacigalupo, Dionne Bucy, MD. The patient was referred to the Chronic Care Management team for assistance with care management needs subsequent to provider initiation of CCM services and plan of care.      [name] returned call to confirm availability for initial CCM assessment.    Requested to complete assessment after his Dermatology appointment today. Agreed to call if availability changes.   PLAN: Appointment scheduled for 04/14/22 at 4 pm.    [place] Manager/Chronic Care Management [phone]

## 2022-04-14 NOTE — Plan of Care (Signed)
Chronic Care Management Provider Comprehensive Care Plan    04/14/2022 Name: Cameron Foster. MRN: [id number] DOB: 21-Sep-1940  Referral to Chronic Care Management (CCM) services was placed by Provider:  [name], MD on Date: 03/31/22.  Chronic Condition 1: HTN Provider Assessment and Plan  Essential hypertension - Primary       Reports home blood pressures remain normal on new medication regimen No changes to medications today Upcoming repeat BMP from home health RN      Expected Outcome/Goals Addressed This Visit (Provider CCM goals/Provider Assessment and plan  Goal: CCM (Hypertension) Expected Outcome:  Monitor, Self-Manage And Reduce Symptoms of Hypertension  Symptom Management Condition 1: Take medications as prescribed   Attend all scheduled provider appointments Call pharmacy for medication refills 3-7 days in advance of running out of medications Check blood pressure daily Keep a blood pressure log Eat more whole grains, fruits and vegetables, lean meats and healthy fats Limit salt intake  Follow recommended fall and safety precautions  Call provider office for new concerns or questions     Chronic Condition 2: A-Fib Provider Assessment and Plan  Persistent atrial fibrillation ([place])       New onset persistent A-fib during hospitalization associated with RVR as below Now followed by cardiology with upcoming follow-up appointment We discussed his bleeding risk versus his thromboembolic risk and the rationale behind treating with Eliquis to prevent stroke Patient remains hesitant to take the Eliquis, but agrees to take it at least until he sees Dr. Saunders Revel and has that conversation as well He reports that his heart rate and blood pressure have been normal since discharge and he is taking his medications with good compliance          Expected Outcome/Goals Addressed This Visit (Provider CCM goals/Provider Assessment and Plan  Goal: CCM (Atrial  Fibrillation) Expected Outcome:  Monitor, Self-Manage And Reduce Symptoms of Atrial Fibrillaton  Symptom Management Condition 2: Monitor pulse (heart) rate daily Assess symptoms daily Take medications as prescribed Attend appointment with the Cardiology team as schedule Contact the clinic with new symptoms or concerns     Chronic Condition 1: HF Provider Assessment and Plan Acute systolic heart failure ([place])    Hypertensive heart disease with congestive heart failure, unspecified heart failure type   Expected Outcome/Goals Addressed This Visit (Provider CCM goals/Provider Assessment and plan  Goal: CCM (Heart Failure) Expected Outcome:  Monitor, Self-Manage And Reduce Symptoms of Heart Failure   Symptom Management Condition 3: Take medications as prescribed   Attend all scheduled provider appointments Call pharmacy for medication refills 3-7 days in advance of running out of medications Monitor weight at least once a week if unable to monitor daily Record weights  Call office for weight gain more than 3 pounds in one day or 5 pounds in one week Watch for swelling in feet, ankles and legs every day Use salt in moderation Call provider office for new concerns or questions   Problem List Patient Active Problem List   Diagnosis Date Noted   Ataxia, 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   Tobacco abuse 12/21/2021   Chronic bronchitis ([place]) 10/13/2021   History of total hip replacement, right 09/27/2021   Persistent atrial fibrillation ([place])    Atrial fibrillation with rapid ventricular response ([place])    Acute clinical systolic heart failure ([place]) 06/26/2021   Left sided colitis ([place]) 06/23/2021   Actinic keratosis 05/24/2021   Squamous cell cancer of skin of left forearm 05/24/2021  Chronic gastric ulcer with hemorrhage 02/25/2021   Irregular Z line of esophagus 02/25/2021   Chronic pansinusitis 01/26/2021   Dizziness 01/26/2021   Chronic cough 01/26/2021    Melena 01/13/2021   Alcohol use disorder, moderate, dependence ([place]) 01/13/2021   Stage 3a chronic kidney disease ([place]) 11/23/2020   Chronic pain of right ankle 05/21/2020   Achilles tendinitis 04/30/2020   Synovitis and tenosynovitis 04/30/2020   Lymphedema 03/03/2020   Chronic venous insufficiency 03/03/2020   Fatigue 02/14/2020   Leg edema 02/14/2020   Foraminal stenosis of lumbar region 11/08/2019   Chronic bilateral low back pain without sciatica 10/18/2019   Foraminal stenosis of cervical region 10/18/2019   PSVT (paroxysmal supraventricular tachycardia) 08/01/2019   History of lacunar cerebrovascular accident (CVA) 07/10/2019   History of stroke 07/01/2019   Paroxysmal atrial fibrillation ([place]) 11/07/2018   TIA (transient ischemic attack) 10/20/2018   History of subarachnoid hemorrhage 10/16/2018   Low libido 08/30/2018   Obesity 05/14/2018   Hyperlipidemia LDL goal <70 05/14/2018   Degeneration of lumbar intervertebral disc 03/22/2018   Osteoarthritis of hip 03/22/2018   Trochanteric bursitis of right hip 03/22/2018   History of colostomy reversal 03/21/2017   Status post partial colectomy 01/03/2017   Depression, major, single episode, mild ([place]) 01/03/2017   Vasovagal syncope 01/27/2016   History of skin cancer 10/28/2015   Osteoarthritis of right knee 10/01/2015   Essential hypertension 08/25/2015   Dyspnea on exertion 08/12/2015   Erectile dysfunction following radiation therapy 08/04/2015   Constipation 08/02/2015   History of prostate cancer 01/20/2015   Swelling of right lower extremity 01/20/2015    Medication Management  Current Outpatient Medications:    amLODipine (NORVASC) 5 MG tablet, TAKE 1 TABLET BY MOUTH EVERY DAY, Disp: 90 tablet, Rfl: 1   aspirin 81 MG chewable tablet, Chew 1 tablet (81 mg total) by mouth 2 (two) times daily., Disp: 60 tablet, Rfl: 0   atorvastatin (LIPITOR) 40 MG tablet, Take 1 tablet (40 mg total) by mouth daily., Disp: 90  tablet, Rfl: 3   docusate sodium (COLACE) 100 MG capsule, Take 1 capsule (100 mg total) by mouth 2 (two) times daily. (Patient taking differently: Take 200 mg by mouth daily.), Disp: 30 capsule, Rfl: 1   fluticasone (FLONASE) 50 MCG/ACT nasal spray, PLACE 1 SPRAY INTO BOTH NOSTRILS DAILY AS NEEDED FOR ALLERGIES OR RHINITIS., Disp: 48 mL, Rfl: 3   hydrocortisone 2.5 % cream, APPLY TO AFFECTED AREA TWICE A DAY AS NEEDED FOR RASH, Disp: 28 g, Rfl: 12   lisinopril-hydrochlorothiazide (ZESTORETIC) 10-12.5 MG tablet, Take 1 tablet by mouth daily., Disp: 90 tablet, Rfl: 3   meclizine (ANTIVERT) 25 MG tablet, Take 25 mg by mouth daily as needed ([place])., Disp: , Rfl:    metoprolol tartrate (LOPRESSOR) 100 MG tablet, Take 1 tablet (100 mg total) by mouth 2 (two) times daily., Disp: 180 tablet, Rfl: 1   Multiple Vitamin (MULTIVITAMIN WITH MINERALS) TABS tablet, Take 1 tablet by mouth daily., Disp: 30 tablet, Rfl: 0   nicotine polacrilex ([employer] NICOTINE) 2 MG lozenge, Take 1 lozenge (2 mg total) by mouth as needed for smoking cessation., Disp: 100 tablet, Rfl: 0   omeprazole (PRILOSEC) 20 MG capsule, Take 20 mg by mouth. Reports currently taking as needed, Disp: , Rfl:    sertraline (ZOLOFT) 50 MG tablet, TAKE 1 TABLET BY MOUTH EVERY DAY, Disp: 90 tablet, Rfl: 1   vitamin B-12 (CYANOCOBALAMIN) 1000 MCG tablet, Take 1,000 mcg by mouth daily., Disp: , Rfl:  Fluticasone-Umeclidin-Vilant (TRELEGY ELLIPTA) 100-62.5-25 MCG/ACT AEPB, Inhale 1 puff into the lungs daily. (Patient not taking: Reported on 02/25/2022), Disp: 1 each, Rfl: 0   nicotine (NICODERM CQ - DOSED IN MG/24 HOURS) 21 mg/24hr patch, Place 1 patch (21 mg total) onto the skin daily. (Patient not taking: Reported on 02/25/2022), Disp: 28 patch, Rfl: 0   pantoprazole (PROTONIX) 40 MG tablet, TAKE 1 TABLET BY MOUTH TWICE A DAY (Patient not taking: Reported on 04/14/2022), Disp: 180 tablet, Rfl: 3  Cognitive Assessment Identity Confirmed: : Name;  DOB Cognitive Status: Normal   Functional Assessment Hearing Difficulty or Deaf: no Wear Glasses or Blind: yes Vision Management: Reading Glasses Only Concentrating, Remembering or Making Decisions Difficulty (CP): no Difficulty Communicating: no Difficulty Eating/Swallowing: no Walking or Climbing Stairs Difficulty: yes Walking or Climbing Stairs: stair climbing difficulty, requires equipment Mobility Management: Uses walker as needed/Requires support rail for stairs Dressing/Bathing Difficulty: no Doing Errands Independently Difficulty (such as shopping) (CP): no Change in Functional Status Since Onset of Current Illness/Injury: no   Caregiver Assessment  Primary Source of Support/Comfort: spouse Name of Support/Comfort Primary Source: [place] in Home: spouse Name(s) of People in Home: [place] if Needed: none   Planned Interventions  HTN Reviewed current treatment plan related to Hypertension, self-management, and patient's adherence to plan as established by provider.  Reviewed medications and indications for use. Reports taking all medications as prescribed. Reports tolerating current regimen well. Denies current concerns related to medication management and prescription cost. Provided information regarding established blood pressure parameters along with indications for notifying a provider. Reports recent BP readings have been lower with systolic readings in the 35W and diastolic readings in the 65K. Advised to record BP readings and notify provider. Reviewed symptoms. Denies chest pain or palpitations. Denies episodes of dizziness or feeling faint despite lower readings. Reviewed symptoms that require medical follow up.  Discussed activity tolerance. Ability to engage in regular exercise is currently limited d/t lower extremity weakness. Currently receiving physical therapy. Thorough review of safety and fall prevention measures. Advised  to use additional caution d/t lower blood pressure readings. Reports having a walker available.  Discussed compliance with recommended cardiac prudent diet. Encouraged to read nutrition labels, monitor sodium intake, and avoid highly processed foods when possible. Denies need for nutritional resources. Reviewed pending appointments. Advised to follow up with the Cardiology team on 05/04/22 as scheduled. Reviewed s/sx of heart attack, stroke and worsening symptoms that require immediate medical attention.    A-Fib Reviewed plan for management of Atrial Fibrillation. Currently taking medication for rate control. Eliquis was previously recommended by the Cardiology team. Currently patient prefers not to start new medications. Will update the care management team if he opts to start new regimen and medication assistance is required.  Reviewed symptoms. Reports symptoms have been well controlled. Provided information regarding new and worsening symptoms that require immediate medical attention.  Counseled on importance of regular laboratory monitoring as prescribed. Screening for signs and symptoms of depression related to chronic disease state. Advised to complete appointment with the Cardiology team as scheduled.    HF Discussed current plan for CHF management.  Provided information regarding recommended weight parameters. Encouraged to weigh daily and record readings.  Encouraged to notify provider for weight gain greater than 3 lbs overnight or weight gain greater than 5 lbs within a week.  Discussed s/sx of fluid overload and indications for notifying a provider. Denies abdominal edema or shortness of breath. Reports edema in  his right leg. Reports this has been a chronic issue for several years. Recalls previously completing examination and ultrasound to r/o DVT. Denies pain, redness, or warmth to the extremity. Verbalized understanding of indications for notifying a provider.  Experiences  exertional dyspnea. Reports occasional swelling in the right leg. Reports discussing with the Cardiology team. Encouraged to assess symptoms daily and contact clinic with changes. Discussed nutritional intake. Advised to closely monitor sodium consumption and avoid highly processed foods when possible. Reviewed worsening s/sx related to CHF exacerbation and indications for seeking immediate medical attention.  Interaction and coordination with outside resources, practitioners, and providers See CCM Referral  Care Plan: Available in MyChart

## 2022-04-14 NOTE — Progress Notes (Signed)
Follow-Up Visit   Subjective  Sender Rueb. is a 81 y.o. male who presents for the following: Annual Exam (HxBCC's, SCC's. Recheck AK's on face and ears. Tx with LN2 at last visit ).  The patient presents for Upper Body Skin Exam (UBSE) for skin cancer screening and mole check.  The patient has spots, moles and lesions to be evaluated, some may be new or changing and the patient has concerns that these could be cancer.   The following portions of the chart were reviewed this encounter and updated as appropriate:      Review of Systems: No other skin or systemic complaints except as noted in HPI or Assessment and Plan.   Objective  Well appearing patient in no apparent distress; mood and affect are within normal limits.  All skin waist up examined.  L zygoma x 1 Filiform waxy papule  R post ear helix x 1, L preauricular x 3, L earlobe x 1, L malar cheek x 3, L forearm x 2(hypertrophic) (10) Erythematous thin papules/macules with gritty scale.   Back Numerous waxy pink/brown papules c/w ISKs, also xerosis   Assessment & Plan   History of Basal Cell Carcinoma of the Skin - No evidence of recurrence today - Recommend regular full body skin exams - Recommend daily broad spectrum sunscreen SPF 30+ to sun-exposed areas, reapply every 2 hours as needed.  - Call if any new or changing lesions are noted between office visits  History of Squamous Cell Carcinoma of the Skin - No evidence of recurrence today - Recommend regular full body skin exams - Recommend daily broad spectrum sunscreen SPF 30+ to sun-exposed areas, reapply every 2 hours as needed.  - Call if any new or changing lesions are noted between office visits  Lentigines - Scattered tan macules - Due to sun exposure - Benign-appearing, observe - Recommend daily broad spectrum sunscreen SPF 30+ to sun-exposed areas, reapply every 2 hours as needed. - Call for any changes  Seborrheic Keratoses - Stuck-on, waxy,  tan-brown papules and/or plaques  - Benign-appearing - Discussed benign etiology and prognosis. - Observe - Call for any changes  Melanocytic Nevi - Tan-brown and/or pink-flesh-colored symmetric macules and papules - Benign appearing on exam today - Observation - Call clinic for new or changing moles - Recommend daily use of broad spectrum spf 30+ sunscreen to sun-exposed areas.   Hemangiomas - Red papules - Discussed benign nature - Observe - Call for any changes  Actinic Damage with PreCancerous Actinic Keratoses Counseling for Topical Chemotherapy Management: Patient exhibits: - Severe, confluent actinic changes with pre-cancerous actinic keratoses that is secondary to cumulative UV radiation exposure over time - Condition that is severe; chronic, not at goal. - diffuse scaly erythematous macules and papules with underlying dyspigmentation - Discussed Prescription "Field Treatment" topical Chemotherapy for Severe, Chronic Confluent Actinic Changes with Pre-Cancerous Actinic Keratoses Field treatment involves treatment of an entire area of skin that has confluent Actinic Changes (Sun/ Ultraviolet light damage) and PreCancerous Actinic Keratoses by method of PhotoDynamic Therapy (PDT) and/or prescription Topical Chemotherapy agents such as 5-fluorouracil, 5-fluorouracil/calcipotriene, and/or imiquimod.  The purpose is to decrease the number of clinically evident and subclinical PreCancerous lesions to prevent progression to development of skin cancer by chemically destroying early precancer changes that may or may not be visible.  It has been shown to reduce the risk of developing skin cancer in the treated area. As a result of treatment, redness, scaling, crusting, and open sores  may occur during treatment course. One or more than one of these methods may be used and may have to be used several times to control, suppress and eliminate the PreCancerous changes. Discussed treatment course,  expected reaction, and possible side effects. - Recommend daily broad spectrum sunscreen SPF 30+ to sun-exposed areas, reapply every 2 hours as needed.  - Staying in the shade or wearing long sleeves, sun glasses (UVA+UVB protection) and wide brim hats (4-inch brim around the entire circumference of the hat) are also recommended. - Call for new or changing lesions. - Patient defers field therapy at this time.  May consider in future.  Inflamed seborrheic keratosis L zygoma x 1  Vs hypertrophic AK on the L zygoma   Destruction of lesion - L zygoma x 1  Destruction method: cryotherapy   Informed consent: discussed and consent obtained   Timeout:  patient name, date of birth, surgical site, and procedure verified Lesion destroyed using liquid nitrogen: Yes   Region frozen until ice ball extended beyond lesion: Yes   Outcome: patient tolerated procedure well with no complications   Post-procedure details: wound care instructions given   Additional details:  Prior to procedure, discussed risks of blister formation, small wound, skin dyspigmentation, or rare scar following cryotherapy. Recommend Vaseline ointment to treated areas while healing.   AK (actinic keratosis) (10) R post ear helix x 1, L preauricular x 3, L earlobe x 1, L malar cheek x 3, L forearm x 2(hypertrophic)  Actinic keratoses are precancerous spots that appear secondary to cumulative UV radiation exposure/sun exposure over time. They are chronic with expected duration over 1 year. A portion of actinic keratoses will progress to squamous cell carcinoma of the skin. It is not possible to reliably predict which spots will progress to skin cancer and so treatment is recommended to prevent development of skin cancer.  Recommend daily broad spectrum sunscreen SPF 30+ to sun-exposed areas, reapply every 2 hours as needed.  Recommend staying in the shade or wearing long sleeves, sun glasses (UVA+UVB protection) and wide brim hats  (4-inch brim around the entire circumference of the hat). Call for new or changing lesions.   Destruction of lesion - R post ear helix x 1, L preauricular x 3, L earlobe x 1, L malar cheek x 3, L forearm x 2(hypertrophic) Complexity: simple   Destruction method: cryotherapy   Informed consent: discussed and consent obtained   Timeout:  patient name, date of birth, surgical site, and procedure verified Lesion destroyed using liquid nitrogen: Yes   Region frozen until ice ball extended beyond lesion: Yes   Outcome: patient tolerated procedure well with no complications   Post-procedure details: wound care instructions given   Additional details:  Prior to procedure, discussed risks of blister formation, small wound, skin dyspigmentation, or rare scar following cryotherapy. Recommend Vaseline ointment to treated areas while healing.   Pruritus Back  Due to ISKs and xerosis  patient defers treatment of ISKs today.  Recommend mild soap and moisturizing cream 1-2 times daily.  Gentle skin care handout provided.      Xerosis - diffuse xerotic patches - recommend gentle, hydrating skin care - gentle skin care handout given  Skin cancer screening performed today.  Return in about 6 months (around 10/14/2022) for AK and ISK follow up .  Luther Redo, CMA, am acting as scribe for [name], MD .  Documentation: I have reviewed the above documentation for accuracy and completeness, and I agree with  the above.  [name] MD

## 2022-04-14 NOTE — Patient Instructions (Signed)
Gentle Skin Care Guide  1. Bathe no more than once a day.  2. Avoid bathing in hot water  3. Use a mild soap like Dove, Vanicream, Cetaphil, CeraVe. Can use Lever 2000 or Cetaphil antibacterial soap  4. Use soap only where you need it. On most days, use it under your arms, between your legs, and on your feet. Let the water rinse other areas unless visibly dirty.  5. When you get out of the bath/shower, use a towel to gently blot your skin dry, don't rub it.  6. While your skin is still a little damp, apply a moisturizing cream such as Vanicream, CeraVe, Cetaphil, Eucerin, Sarna lotion or plain Vaseline Jelly. For hands apply Neutrogena [place] Hand Cream or Excipial Hand Cream.  7. Reapply moisturizer any time you start to itch or feel dry.  8. Sometimes using free and clear laundry detergents can be helpful. Fabric softener sheets should be avoided. Downy Free & Gentle liquid, or any liquid fabric softener that is free of dyes and perfumes, it acceptable to use  9. If your doctor has given you prescription creams you may apply moisturizers over them      Due to recent changes in healthcare laws, you may see results of your pathology and/or laboratory studies on MyChart before the doctors have had a chance to review them. We understand that in some cases there may be results that are confusing or concerning to you. Please understand that not all results are received at the same time and often the doctors may need to interpret multiple results in order to provide you with the best plan of care or course of treatment. Therefore, we ask that you please give [place] 2 business days to thoroughly review all your results before contacting the office for clarification. Should we see a critical lab result, you will be contacted sooner.   If You Need Anything After Your Visit  If you have any questions or concerns for your doctor, please call our main line at [phone] and press option 4 to reach  your doctor's medical assistant. If no one answers, please leave a voicemail as directed and we will return your call as soon as possible. Messages left after 4 pm will be answered the following business day.   You may also send [place] a message via [place]. We typically respond to MyChart messages within 1-2 business days.  For prescription refills, please ask your pharmacy to contact our office. Our fax number is [phone].  If you have an urgent issue when the clinic is closed that cannot wait until the next business day, you can page your doctor at the number below.    Please note that while we do our best to be available for urgent issues outside of office hours, we are not available 24/7.   If you have an urgent issue and are unable to reach [place], you may choose to seek medical care at your doctor's office, retail clinic, urgent care center, or emergency room.  If you have a medical emergency, please immediately call 911 or go to the emergency department.  Pager Numbers  - Dr. Nehemiah Massed: [phone]  - Dr. Laurence Ferrari: [phone]  - Dr. Nicole Kindred: [phone]  In the event of inclement weather, please call our main line at [phone] for an update on the status of any delays or closures.  Dermatology Medication Tips: Please keep the boxes that topical medications come in in order to help keep track of the instructions about  where and how to use these. Pharmacies typically print the medication instructions only on the boxes and not directly on the medication tubes.   If your medication is too expensive, please contact our office at [phone] option 4 or send [place] a message through [place].   We are unable to tell what your co-pay for medications will be in advance as this is different depending on your insurance coverage. However, we may be able to find a substitute medication at lower cost or fill out paperwork to get insurance to cover a needed medication.   If a prior authorization  is required to get your medication covered by your insurance company, please allow [place] 1-2 business days to complete this process.  Drug prices often vary depending on where the prescription is filled and some pharmacies may offer cheaper prices.  The website www.goodrx.com contains coupons for medications through different pharmacies. The prices here do not account for what the cost may be with help from insurance (it may be cheaper with your insurance), but the website can give you the price if you did not use any insurance.  - You can print the associated coupon and take it with your prescription to the pharmacy.  - You may also stop by our office during regular business hours and pick up a GoodRx coupon card.  - If you need your prescription sent electronically to a different pharmacy, notify our office through [facility] or by phone at [phone] option 4.     Si Usted Necesita Algo Despus de Su Visita  Tambin puede enviarnos un mensaje a travs de Pharmacist, community. Por lo general respondemos a los mensajes de MyChart en el transcurso de 1 a 2 das hbiles.  Para renovar recetas, por favor pida a su farmacia que se ponga en contacto con nuestra oficina. Harland Dingwall de fax es [place] [phone].  Si tiene un asunto urgente cuando la clnica est cerrada y que no puede esperar hasta el siguiente da hbil, puede llamar/localizar a su doctor(a) al nmero que aparece a continuacin.   Por favor, tenga en cuenta que aunque hacemos todo lo posible para estar disponibles para asuntos urgentes fuera del horario de [place], no estamos disponibles las 24 horas del da, los 7 das de la [place].   Si tiene un problema urgente y no puede comunicarse con nosotros, puede optar por buscar atencin mdica  en el consultorio de su doctor(a), en una clnica privada, en un centro de atencin urgente o en una sala de emergencias.  Si tiene Engineering geologist, por favor llame inmediatamente al 911 o  vaya a la sala de emergencias.  Nmeros de bper  - Dr. Nehemiah Massed: [phone]  - Dra. Moye: [phone]  - Dra. Nicole Kindred: [phone]  En caso de inclemencias del [place], por favor llame a Johnsie Kindred principal al [phone] para una actualizacin sobre el [place] de cualquier retraso o cierre.  Consejos para la medicacin en dermatologa: Por favor, guarde las cajas en las que vienen los medicamentos de uso tpico para ayudarle a seguir las instrucciones sobre dnde y cmo usarlos. Las farmacias generalmente imprimen las instrucciones del medicamento slo en las cajas y no directamente en los tubos del [place].   Si su medicamento es muy caro, por favor, pngase en contacto con Zigmund Daniel llamando al [phone] y presione la opcin 4 o envenos un mensaje a travs de Pharmacist, community.   No podemos decirle cul ser su copago por los medicamentos por adelantado ya que esto  es diferente dependiendo de la cobertura de su seguro. Sin embargo, es posible que podamos encontrar un medicamento sustituto a Electrical engineer un formulario para que el seguro cubra el medicamento que se considera necesario.   Si se requiere una autorizacin previa para que su compaa de seguros [place] su medicamento, por favor permtanos de 1 a 2 das hbiles para completar este proceso.  Los precios de los medicamentos varan con frecuencia dependiendo del Environmental consultant de dnde se surte la receta y alguna farmacias pueden ofrecer precios ms baratos.  El sitio web www.goodrx.com tiene cupones para medicamentos de Airline pilot. Los precios aqu no tienen en cuenta lo que podra costar con la ayuda del seguro (puede ser ms barato con su seguro), pero el sitio web puede darle el precio si no utiliz Research scientist (physical sciences).  - Puede imprimir el cupn correspondiente y llevarlo con su receta a la farmacia.  - Tambin puede pasar por nuestra oficina durante el horario de atencin regular y Charity fundraiser una tarjeta de cupones  de GoodRx.  - Si necesita que su receta se enve electrnicamente a una farmacia diferente, informe a nuestra oficina a travs de MyChart de [place] o por telfono llamando al [phone] y presione la opcin 4.

## 2022-04-14 NOTE — Chronic Care Management (AMB) (Signed)
Chronic Care Management   CCM RN Visit Note  04/14/2022 Name: Cameron Foster. MRN: [id number] DOB: 05/14/1940  Subjective: Cameron Foster. is a 81 y.o. year old male who is a primary care patient of Bacigalupo, Dionne Bucy, MD. The patient was referred to the Chronic Care Management team for assistance with care management needs subsequent to provider initiation of CCM services and plan of care.    Today's Visit:  Engaged with patient by telephone for initial visit.       Goals Addressed             This Visit's Progress    Goal: CCM (Atrial Fibrillation) Expected Outcome:  Monitor, Self-Manage And Reduce Symptoms of Atrial Fibrillaton       Current Barriers:  Chronic Disease Management support and education needs related to Atrial Fibrillation  Planned Interventions: Reviewed plan for management of Atrial Fibrillation. Currently taking medication for rate control. Eliquis was previously recommended by the Cardiology team. Currently patient prefers not to start new medications. Will update the care management team if he opts to start new regimen and medication assistance is required.  Reviewed symptoms. Reports symptoms have been well controlled. Provided information regarding new and worsening symptoms that require immediate medical attention.  Counseled on importance of regular laboratory monitoring as prescribed. Screening for signs and symptoms of depression related to chronic disease state. Advised to complete appointment with the Cardiology team as scheduled.    Symptom Management: Monitor pulse (heart) rate daily Assess symptoms daily Take medications as prescribed Attend appointment with the Cardiology team as schedule Contact the clinic with new symptoms or concerns  Follow Up Plan:  Will follow up within the next month          Goal: CCM (Heart Failure) Expected Outcome:  Monitor, Self-Manage And Reduce Symptoms of Heart Failure       Current Barriers:   Chronic Disease Management support and education needs related to Heart Failure  Planned Interventions: Discussed current plan for CHF management.  Provided information regarding recommended weight parameters. Encouraged to weigh daily and record readings.  Encouraged to notify provider for weight gain greater than 3 lbs overnight or weight gain greater than 5 lbs within a week.  Discussed s/sx of fluid overload and indications for notifying a provider. Denies abdominal edema or shortness of breath. Reports edema in his right leg. Reports this has been a chronic issue for several years. Recalls previously completing examination and ultrasound to r/o DVT. Denies pain, redness, or warmth to the extremity. Verbalized understanding of indications for notifying a provider.  Experiences exertional dyspnea. Reports occasional swelling in the right leg. Reports discussing with the Cardiology team. Encouraged to assess symptoms daily and contact clinic with changes. Discussed nutritional intake. Advised to closely monitor sodium consumption and avoid highly processed foods when possible. Reviewed worsening s/sx related to CHF exacerbation and indications for seeking immediate medical attention.     Wt Readings from Last 3 Encounters:  02/25/22 200 lb (90.7 kg)  12/28/21 208 lb (94.3 kg)  09/27/21 203 lb (92.1 kg)   Symptom Management: Take medications as prescribed   Attend all scheduled provider appointments Call pharmacy for medication refills 3-7 days in advance of running out of medications Monitor weight at least once a week if unable to monitor daily Record weights  Call office for weight gain more than 3 pounds in one day or 5 pounds in one week Watch for swelling in feet, ankles and legs  every day Use salt in moderation Call provider office for new concerns or questions   Follow Up Plan:   Will follow up within the next month            Goal: CCM (Hypertension) Expected Outcome:   Monitor, Self-Manage And Reduce Symptoms of Hypertension       Current Barriers:  Chronic Disease Management support and education needs related to Hypertension  Planned Interventions: Reviewed current treatment plan related to Hypertension, self-management, and patient's adherence to plan as established by provider.  Reviewed medications and indications for use. Reports taking all medications as prescribed. Reports tolerating current regimen well. Denies current concerns related to medication management and prescription cost. Provided information regarding established blood pressure parameters along with indications for notifying a provider. Reports recent BP readings have been lower with systolic readings in the 27C and diastolic readings in the 62B. Advised to record BP readings and notify provider. Reviewed symptoms. Denies chest pain or palpitations. Denies episodes of dizziness or feeling faint despite lower readings. Reviewed symptoms that require medical follow up.  Discussed activity tolerance. Ability to engage in regular exercise is currently limited d/t lower extremity weakness. Currently receiving physical therapy. Thorough review of safety and fall prevention measures. Advised to use additional caution d/t lower blood pressure readings. Reports having a walker available.  Discussed compliance with recommended cardiac prudent diet. Encouraged to read nutrition labels, monitor sodium intake, and avoid highly processed foods when possible. Denies need for nutritional resources. Reviewed pending appointments. Advised to follow up with the Cardiology team on 05/04/22 as scheduled. Reviewed s/sx of heart attack, stroke and worsening symptoms that require immediate medical attention.   BP Readings from Last 3 Encounters:  04/14/22 (!) 92/51  02/25/22 (!) 160/99  12/28/21 108/60   Symptom Management: Take medications as prescribed   Attend all scheduled provider appointments Call  pharmacy for medication refills 3-7 days in advance of running out of medications Check blood pressure daily Keep a blood pressure log Eat more whole grains, fruits and vegetables, lean meats and healthy fats Limit salt intake  Follow recommended fall and safety precautions  Call provider office for new concerns or questions   Follow Up Plan:  Will follow up within the next month                    PLAN: Will follow up within the next month  [place] Manager/Chronic Care Management [phone]

## 2022-04-14 NOTE — Patient Instructions (Signed)
Please call the care guide team at [phone] if you need to cancel or reschedule your appointment.      Following is a copy of the CCM Program Consent:  CCM service includes personalized support from designated clinical staff supervised by the physician, including individualized plan of care and coordination with other care providers 24/7 contact phone numbers for assistance for urgent and routine care needs. Service will only be billed when office clinical staff spend 20 minutes or more in a month to coordinate care. Only one practitioner may furnish and bill the service in a calendar month. The patient may stop CCM services at amy time (effective at the end of the month) by phone call to the office staff. The patient will be responsible for cost sharing (co-pay) or up to 20% of the service fee (after annual deductible is met)  Following is a copy of your full provider care plan:    Patient verbalizes understanding of instructions and care plan provided today and agrees to view in [place]. Active MyChart status and patient understanding of how to access instructions and care plan via MyChart confirmed with patient.

## 2022-04-15 ENCOUNTER — Ambulatory Visit: Payer: Self-pay

## 2022-04-15 DIAGNOSIS — I10 Essential (primary) hypertension: Secondary | ICD-10-CM

## 2022-04-15 NOTE — Chronic Care Management (AMB) (Unsigned)
  Chronic Care Management   CCM RN Visit Note  04/15/2022 Name: Cameron Foster. MRN: [id number] DOB: October 01, 1940  Subjective: Cameron Foster. is a 81 y.o. year old male who is a primary care patient of Bacigalupo, Dionne Bucy, MD. The patient was referred to the Chronic Care Management team for assistance with care management needs subsequent to provider initiation of CCM services and plan of care.    Today's Visit:  {CCMTELEPHONEFACETOFACE:21091510} for {CCMINITIALFOLLOWUPCHOICE:21091511}.        Goals Addressed   None     Plan:{CM FOLLOW UP PLAN:25073}  SIG***

## 2022-04-16 ENCOUNTER — Encounter: Payer: Self-pay | Admitting: Family Medicine

## 2022-04-17 DIAGNOSIS — F32A Depression, unspecified: Secondary | ICD-10-CM | POA: Diagnosis not present

## 2022-04-17 DIAGNOSIS — I4819 Other persistent atrial fibrillation: Secondary | ICD-10-CM | POA: Diagnosis not present

## 2022-04-17 DIAGNOSIS — I10 Essential (primary) hypertension: Secondary | ICD-10-CM

## 2022-04-19 ENCOUNTER — Ambulatory Visit: Payer: Self-pay

## 2022-04-19 DIAGNOSIS — I5021 Acute systolic (congestive) heart failure: Secondary | ICD-10-CM

## 2022-04-19 DIAGNOSIS — I10 Essential (primary) hypertension: Secondary | ICD-10-CM

## 2022-04-19 NOTE — Chronic Care Management (AMB) (Cos[place] Needed Addendum)
  Chronic Care Management   CCM RN Visit Note  04/19/2022 Name: Cameron Foster. MRN: [id number] DOB: 1941/02/14  Subjective: Cameron Foster. is a 82 y.o. year old male who is a primary care patient of Bacigalupo, Dionne Bucy, MD. The patient was referred to the Chronic Care Management team for assistance with care management needs subsequent to provider initiation of CCM services and plan of care.      Goals Addressed             This Visit's Progress    Goal: CCM (Heart Failure) Expected Outcome:  Monitor, Self-Manage And Reduce Symptoms of Heart Failure       Current Barriers:  Chronic Disease Management support and education needs related to Heart Failure  Planned Interventions: Discussed current plan for CHF management.  Provided information regarding recommended weight parameters. Encouraged to weigh daily and record readings.  Encouraged to notify provider for weight gain greater than 3 lbs overnight or weight gain greater than 5 lbs within a week.  Discussed s/sx of fluid overload and indications for notifying a provider. Denies abdominal edema or shortness of breath. Reports edema in his right leg. Reports this has been a chronic issue for several years. Recalls previously completing examination and ultrasound to r/o DVT. Denies pain, redness, or warmth to the extremity. Verbalized understanding of indications for notifying a provider.  Experiences exertional dyspnea. Reports occasional swelling in the right leg. Reports discussing with the Cardiology team. Encouraged to assess symptoms daily and contact clinic with changes. Discussed nutritional intake. Advised to closely monitor sodium consumption and avoid highly processed foods when possible. Reviewed worsening s/sx related to CHF exacerbation and indications for seeking immediate medical attention. Update: 04/19/22 Engaged with patient by telephone regarding need for follow up with PCP.  Mr. Nicolich informed of scheduled clinic  visit on 04/25/22 at 3:40 pm.     Wt Readings from Last 3 Encounters:  02/25/22 200 lb (90.7 kg)  12/28/21 208 lb (94.3 kg)  09/27/21 203 lb (92.1 kg)   Symptom Management: Take medications as prescribed   Attend all scheduled provider appointments Call pharmacy for medication refills 3-7 days in advance of running out of medications Monitor weight at least once a week if unable to monitor daily Record weights  Call office for weight gain more than 3 pounds in one day or 5 pounds in one week Watch for swelling in feet, ankles and legs every day Use salt in moderation Call provider office for new concerns or questions   Follow Up Plan:   Will follow up within the next month                 PLAN: Patient will complete appointment as scheduled on 04/25/22.    Horris Latino RN Care Manager/Chronic Care Management [phone]

## 2022-04-20 DIAGNOSIS — R2689 Other abnormalities of gait and mobility: Secondary | ICD-10-CM | POA: Diagnosis not present

## 2022-04-20 DIAGNOSIS — M25551 Pain in right hip: Secondary | ICD-10-CM | POA: Diagnosis not present

## 2022-04-20 DIAGNOSIS — M25651 Stiffness of right hip, not elsewhere classified: Secondary | ICD-10-CM | POA: Diagnosis not present

## 2022-04-22 ENCOUNTER — Other Ambulatory Visit: Payer: Self-pay | Admitting: Family Medicine

## 2022-04-22 ENCOUNTER — Other Ambulatory Visit: Payer: Self-pay | Admitting: Internal Medicine

## 2022-04-22 DIAGNOSIS — I110 Hypertensive heart disease with heart failure: Secondary | ICD-10-CM

## 2022-04-22 DIAGNOSIS — I10 Essential (primary) hypertension: Secondary | ICD-10-CM

## 2022-04-22 NOTE — Telephone Encounter (Signed)
Requested Prescriptions  Pending Prescriptions Disp Refills   metoprolol tartrate (LOPRESSOR) 100 MG tablet [Pharmacy Med Name: METOPROLOL TARTRATE 100 MG TAB] 180 tablet 0    Sig: TAKE 1 TABLET BY MOUTH TWICE A DAY     Cardiovascular:  Beta Blockers Passed - 04/22/2022 12:09 PM      Passed - Last BP in normal range    BP Readings from Last 1 Encounters:  04/14/22 (!) 92/51         Passed - Last Heart Rate in normal range    Pulse Readings from Last 1 Encounters:  04/14/22 65         Passed - Valid encounter within last 6 months    Recent Outpatient Visits           3 months ago Ataxia, late effect of cerebrovascular disease   [place], Kirstie Peri, MD   6 months ago Essential hypertension   [employer], [place], PA-C   9 months ago Essential hypertension   [facility] [place], Dionne Bucy, MD   9 months ago Depression, major, single episode, mild [facility])   [employer], Erin E, PA-C   10 months ago Acute recurrent sinusitis, unspecified location   [employer], Dani Gobble, PA-C       Future Appointments             In 3 days Bacigalupo, Dionne Bucy, MD [facility], [place]   In 1 week End, Harrell Gave, [place]. [place]   In 5 months [name], MD [place]

## 2022-04-25 ENCOUNTER — Ambulatory Visit (INDEPENDENT_AMBULATORY_CARE_PROVIDER_SITE_OTHER): Payer: No Typology Code available for payment source | Admitting: Family Medicine

## 2022-04-25 ENCOUNTER — Encounter: Payer: Self-pay | Admitting: Family Medicine

## 2022-04-25 VITALS — BP 134/73 | HR 62 | Temp 97.7°F | Resp 16 | Wt 217.0 lb

## 2022-04-25 DIAGNOSIS — R29898 Other symptoms and signs involving the musculoskeletal system: Secondary | ICD-10-CM | POA: Diagnosis not present

## 2022-04-25 DIAGNOSIS — N3943 Post-void dribbling: Secondary | ICD-10-CM | POA: Diagnosis not present

## 2022-04-25 DIAGNOSIS — R269 Unspecified abnormalities of gait and mobility: Secondary | ICD-10-CM | POA: Insufficient documentation

## 2022-04-25 DIAGNOSIS — N4889 Other specified disorders of penis: Secondary | ICD-10-CM | POA: Diagnosis not present

## 2022-04-25 MED ORDER — SILDENAFIL CITRATE 20 MG PO TABS: 20.0000 mg | ORAL_TABLET | Freq: Every day | ORAL | 2 refills | Status: DC

## 2022-04-25 NOTE — Progress Notes (Signed)
I,Sulibeya S Dimas,acting as a Education administrator for Lavon Paganini, MD.,have documented all relevant documentation on the behalf of Lavon Paganini, MD,as directed by  Lavon Paganini, MD while in the presence of Lavon Paganini, MD.     Established patient visit   Patient: Cameron Foster.   DOB: [birth year]   82 y.o. Male  MRN: [id number] Visit Date: 04/25/2022  Today's healthcare provider: Lavon Paganini, MD   Chief Complaint  Patient presents with   Extremity Weakness   Subjective    HPI  Patient C/O leg weakness x 6 months. He reports symptoms are staying the same. Has h/o R hip and knee replacement.  Rehab did not help. Wants imaging to make sure that nothing was messed up during surgery.  Will wake up with discomfort over bladder in the middle of the night.  In the early part of the 2000s had a lot of issues with prostate enlargement. Had many treatments that failed and then had microwave (TUMT) therapy. Then had issues with reverse ejaculation.  Then had prostate cancer and loss sexual ability.  Reports that now his penis is smaller and urine is dribbling in the floor. He has discussed with urology and has felt not listened to.  Reviewed Dr Dene Gentry note from 09/09/20.  1.  History prostate cancer PSA remains undetectable   2.  Penile atrophy We discussed loss of oxygenated blood flow through the corporal tissues can result in penile atrophy and can occur after prostate cancer treatment and ADT Discussed that once this occurs it is not reversible He inquired about taking sildenafil and was informed that low-dose PDE 5 inhibitor can improve oxygenated blood flow and may prevent the problem from getting worse but would not correct Rx sildenafil was sent to his pharmacy 20 mg daily for a 36-monthtrial   3.  Hypogonadism Would not recommend TRT for loss of body hair   4.  Postvoid dribbling He feels this is due to penile atrophy  Medications: Outpatient Medications  Prior to Visit  Medication Sig   amLODipine (NORVASC) 5 MG tablet TAKE 1 TABLET BY MOUTH EVERY DAY   aspirin 81 MG chewable tablet Chew 1 tablet (81 mg total) by mouth 2 (two) times daily.   atorvastatin (LIPITOR) 40 MG tablet Take 1 tablet (40 mg total) by mouth daily.   docusate sodium (COLACE) 100 MG capsule Take 1 capsule (100 mg total) by mouth 2 (two) times daily. (Patient taking differently: Take 200 mg by mouth daily.)   fluticasone (FLONASE) 50 MCG/ACT nasal spray PLACE 1 SPRAY INTO BOTH NOSTRILS DAILY AS NEEDED FOR ALLERGIES OR RHINITIS.   hydrocortisone 2.5 % cream APPLY TO AFFECTED AREA TWICE A DAY AS NEEDED FOR RASH   lisinopril-hydrochlorothiazide (ZESTORETIC) 10-12.5 MG tablet Take 1 tablet by mouth daily.   meclizine (ANTIVERT) 25 MG tablet Take 25 mg by mouth daily as needed (Vertigo).   metoprolol tartrate (LOPRESSOR) 100 MG tablet TAKE 1 TABLET BY MOUTH TWICE A DAY   Multiple Vitamin (MULTIVITAMIN WITH MINERALS) TABS tablet Take 1 tablet by mouth daily.   nicotine polacrilex ([employer] NICOTINE) 2 MG lozenge Take 1 lozenge (2 mg total) by mouth as needed for smoking cessation.   omeprazole (PRILOSEC) 20 MG capsule Take 20 mg by mouth. Reports currently taking as needed   sertraline (ZOLOFT) 50 MG tablet TAKE 1 TABLET BY MOUTH EVERY DAY   vitamin B-12 (CYANOCOBALAMIN) 1000 MCG tablet Take 1,000 mcg by mouth daily.   [DISCONTINUED] Fluticasone-Umeclidin-Vilant (TRELEGY ELLIPTA)  100-62.5-25 MCG/ACT AEPB Inhale 1 puff into the lungs daily. (Patient not taking: Reported on 02/25/2022)   [DISCONTINUED] nicotine (NICODERM CQ - DOSED IN MG/24 HOURS) 21 mg/24hr patch Place 1 patch (21 mg total) onto the skin daily. (Patient not taking: Reported on 02/25/2022)   [DISCONTINUED] pantoprazole (PROTONIX) 40 MG tablet TAKE 1 TABLET BY MOUTH TWICE A DAY (Patient not taking: Reported on 04/14/2022)   No facility-administered medications prior to visit.    Review of Systems  Constitutional:   Negative for chills and fever.  Respiratory:  Negative for shortness of breath.   Cardiovascular:  Positive for leg swelling. Negative for chest pain.  Musculoskeletal:  Positive for myalgias.       Objective    BP 134/73 (BP Location: Right Arm, Patient Position: Sitting, Cuff Size: Large)   Pulse 62   Temp 97.7 F (36.5 C) (Oral)   Resp 16   Wt 217 lb (98.4 kg)   SpO2 98%   BMI 32.99 kg/m  BP Readings from Last 3 Encounters:  04/25/22 134/73  04/14/22 (!) 92/51  02/25/22 (!) 160/99   Wt Readings from Last 3 Encounters:  04/25/22 217 lb (98.4 kg)  02/25/22 200 lb (90.7 kg)  12/28/21 208 lb (94.3 kg)      Physical Exam Constitutional:      General: He is not in acute distress.    Appearance: Normal appearance.  HENT:     Head: Normocephalic and atraumatic.  Eyes:     Conjunctiva/sclera: Conjunctivae normal.  Pulmonary:     Effort: Pulmonary effort is normal. No respiratory distress.  Musculoskeletal:     Cervical back: Neck supple.  Lymphadenopathy:     Cervical: No cervical adenopathy.  Neurological:     Mental Status: He is alert.       No results found for any visits on 04/25/22.  Assessment & Plan     Problem List Items Addressed This Visit       Genitourinary   Penile atrophy    2/2 prostate cancer treatment Does not want f/u with urology at this time  Per last urology note, will try PDE5 inhibitor (Sildenafil 20 mg daily) to see if this helps Would not recommend hormonal/TRT given h/o prostate cancer        Other   Post-void dribbling    Seems related to penile atrophy No prostate symptoms      Bilateral leg weakness - Primary    Strength intact on exam, but having difficulty rising from chair Advised to discuss with his [facility] surgeon He has completed rehab and not interested in more PT Discussed that hip flexor weakness is common after hip replacement      Gait abnormality    Patient reports feeling as though he is going to  fall His head leads his gait and he is leaning forward He does not want more PT Advised working on posture and core strength        Return in about 3 months (around 07/25/2022) for chronic disease f/u.      I, Lavon Paganini, MD, have reviewed all documentation for this visit. The documentation on 04/25/22 for the exam, diagnosis, procedures, and orders are all accurate and complete.   Charlann Wayne, Dionne Bucy, MD, MPH [facility]

## 2022-04-25 NOTE — Assessment & Plan Note (Signed)
2/2 prostate cancer treatment Does not want f/u with urology at this time  Per last urology note, will try PDE5 inhibitor (Sildenafil 20 mg daily) to see if this helps Would not recommend hormonal/TRT given h/o prostate cancer

## 2022-04-25 NOTE — Assessment & Plan Note (Signed)
Seems related to penile atrophy No prostate symptoms

## 2022-04-25 NOTE — Assessment & Plan Note (Signed)
Strength intact on exam, but having difficulty rising from chair Advised to discuss with his [facility] surgeon He has completed rehab and not interested in more PT Discussed that hip flexor weakness is common after hip replacement

## 2022-04-25 NOTE — Assessment & Plan Note (Signed)
Patient reports feeling as though he is going to fall His head leads his gait and he is leaning forward He does not want more PT Advised working on posture and core strength

## 2022-04-27 ENCOUNTER — Ambulatory Visit: Payer: No Typology Code available for payment source | Attending: Physician Assistant

## 2022-04-27 DIAGNOSIS — I4819 Other persistent atrial fibrillation: Secondary | ICD-10-CM | POA: Diagnosis not present

## 2022-04-27 DIAGNOSIS — I519 Heart disease, unspecified: Secondary | ICD-10-CM | POA: Diagnosis not present

## 2022-04-27 LAB — ECHOCARDIOGRAM COMPLETE
AR max vel: 2.52 cm2
AV Area VTI: 2.7 cm2
AV Area mean vel: 2.29 cm2
AV Mean grad: 4 mmHg
AV Peak grad: 7 mmHg
Ao pk vel: 1.32 m/s
Area-P 1/2: 3.53 cm2
Calc EF: 51.5 %
S' Lateral: 3.6 cm
Single Plane A2C EF: 53.1 %
Single Plane A4C EF: 51.9 %

## 2022-04-29 ENCOUNTER — Telehealth: Payer: Self-pay

## 2022-04-29 NOTE — Telephone Encounter (Signed)
Copied from [place] [id number]. Topic: General - Inquiry >> Apr 29, 2022  3:45 PM Penni Bombard wrote: Reason for CCRN:  Pt called saying the Sildenafil was over 200.00 at the pharmacy.  He was told it would not be that expensive.  Is there something else he can use or coupon.  CB#  [phone]

## 2022-05-02 ENCOUNTER — Encounter: Payer: Self-pay | Admitting: Family Medicine

## 2022-05-02 NOTE — Telephone Encounter (Signed)
Left detailed message advising as below. 

## 2022-05-02 NOTE — Telephone Encounter (Signed)
We talked about it being affordable with goodRx coupon.  He should use that.

## 2022-05-04 ENCOUNTER — Encounter: Payer: Self-pay | Admitting: Internal Medicine

## 2022-05-04 ENCOUNTER — Ambulatory Visit: Payer: No Typology Code available for payment source | Attending: Internal Medicine | Admitting: Internal Medicine

## 2022-05-04 VITALS — BP 130/80 | HR 66 | Ht 68.0 in | Wt 218.0 lb

## 2022-05-04 DIAGNOSIS — R3989 Other symptoms and signs involving the genitourinary system: Secondary | ICD-10-CM | POA: Diagnosis not present

## 2022-05-04 DIAGNOSIS — I10 Essential (primary) hypertension: Secondary | ICD-10-CM | POA: Diagnosis not present

## 2022-05-04 DIAGNOSIS — I5022 Chronic systolic (congestive) heart failure: Secondary | ICD-10-CM | POA: Insufficient documentation

## 2022-05-04 DIAGNOSIS — I4891 Unspecified atrial fibrillation: Secondary | ICD-10-CM

## 2022-05-04 DIAGNOSIS — E785 Hyperlipidemia, unspecified: Secondary | ICD-10-CM

## 2022-05-04 DIAGNOSIS — I5032 Chronic diastolic (congestive) heart failure: Secondary | ICD-10-CM | POA: Insufficient documentation

## 2022-05-04 DIAGNOSIS — I509 Heart failure, unspecified: Secondary | ICD-10-CM | POA: Insufficient documentation

## 2022-05-04 NOTE — Patient Instructions (Signed)
Medication Instructions:  Your physician recommends that you continue on your current medications as directed. Please refer to the Current Medication list given to you today.  *If you need a refill on your cardiac medications before your next appointment, please call your pharmacy*   Lab Work: None ordered  If you have labs (blood work) drawn today and your tests are completely normal, you will receive your results only by: [place] (if you have MyChart) OR A paper copy in the mail If you have any lab test that is abnormal or we need to change your treatment, we will call you to review the results.   Testing/Procedures: No testing ordered  Follow-Up: At [facility], you and your health needs are our priority.  As part of our continuing mission to provide you with exceptional heart care, we have created designated Provider Care Teams.  These Care Teams include your primary Cardiologist (physician) and Advanced Practice Providers (APPs -  Physician Assistants and Nurse Practitioners) who all work together to provide you with the care you need, when you need it.  We recommend signing up for the patient portal called "MyChart".  Sign up information is provided on this After Visit Summary.  MyChart is used to connect with patients for Virtual Visits (Telemedicine).  Patients are able to view lab/test results, encounter notes, upcoming appointments, etc.  Non-urgent messages can be sent to your provider as well.   To learn more about what you can do with MyChart, go to [email or web address].    Your next appointment:   6 month(s)  Provider:   You may see [name], MD or one of the following Advanced Practice Providers on your designated Care Team:   Murray Hodgkins, NP Christell Faith, PA-C Cadence Kathlen Mody, PA-C Gerrie Nordmann, NP

## 2022-05-04 NOTE — Progress Notes (Signed)
Follow-up Outpatient Visit Date: 05/04/2022  Primary Care Provider: [name], MD [address] [place] [address]  Chief Complaint: Follow-up atrial fibrillation and heart failure with mildly reduced ejection fraction  HPI:  Mr. Cameron Foster is a 82 y.o. male with history of persistent atrial fibrillation in the setting of ischemic colitis with no further episodes on subsequent cardiac monitoring, heart failure with mildly reduced ejection fraction in the setting of acute illness with subsequent normalization of LVEF, hypertension, hyperlipidemia, subarachnoid hemorrhage of uncertain etiology complicated by subsequent stroke, prostate cancer, and alcohol abuse, who presents for follow-up of atrial fibrillation.  He was last evaluated by Christell Faith, PA, via virtual visit in November.  Anticoagulation had been held given isolated episode of atrial fibrillation in the setting of colitis (event monitor showed no recurrent atrial fibrillation) and high bleeding risk.  He was feeling well at the time of his visit with Mr. Cameron Foster other than slight worsening of his chronic dizziness.  Today, Mr. Vilardi reports that he has been feeling fairly well.  His main complaint is that he has difficulty voiding as his penis is some times covered by surrounding tissues.  He inquires about using sildenafil to help with this.  He denies chest pain, shortness of breath, palpitations, lightheadedness, edema, and falls.  He notes that his gait is somewhat unsteady and slow, as he still has some difficulty walking after his right hip replacement in July.  --------------------------------------------------------------------------------------------------  Past Medical History:  Diagnosis Date   Alcoholism ([place])    Aortic atherosclerosis ([place])    Arthritis    Ascending aorta dilation ([place]) 03/28/2018   a.) Vascular US: prox asc Ao measured 29 mm. b.)  CT CAP 06/22/2021: Ao root 41 mm. c.) TTE 06/26/2021:  Ao root 41 mm; asc Ao 39 mm   Atrial fibrillation (HCC) 1995   a.) single episode in 1995 per patient; no long term treatment. b.) recurrent episode in the setting of GI bleeding related to colitis Jul 12, 2021.   Basal cell carcinoma 04/26/2017   Right medial cheek. Superficial and nodular   Basal cell carcinoma 06/11/2019   Left anterior shoulder. Nodular pattern   Basal cell carcinoma 09/09/2019   Right nasal ala, EDC   Basal cell carcinoma 02/05/2020   L upper eyebrow, EDC    Bilateral carotid artery stenosis 06/09/2019   a.) Carotid doppler: mod; <50% BILATERAL ICAs.   Chicken pox    Colon cancer (HCC)    Colon polyp    Depression    Son died 07-13-14   Diverticulitis    Diverticulosis 30 years   Gastritis    GERD (gastroesophageal reflux disease)    H. pylori infection    History of stress test    a. 09/2015 MV: No ischemia/infarct. EF 45-54% (nl by echo).   Hyperlipidemia    Hypertension    Irritable bowel syndrome    Lacunar infarction ([place]) 06/08/2019   a.) small; RIGHT motor strip   NSVT (nonsustained ventricular tachycardia) (HCC)    a.)  Single episode lasting 5 beats at a maximum rate of 160 bpm noted on Holter study performed 08/13/2021.   Prostate cancer ([place]) 07/13/10   a.) s/p XRT   PSVT (paroxysmal supraventricular tachycardia)    a. 07/13/19 Zio: Avg rate 74 (54-120), occas PACs, rare PVCs, 125 episodes of PVCs (longest 17.5 secs; max rate 187). No afib.   RBBB    SAH (subarachnoid hemorrhage) ([place]) 10/10/2018   Squamous cell carcinoma of  skin 07/18/2017   Left medial calf. KA type   Squamous cell carcinoma of skin 04/24/2018   Right above med. brow   Squamous cell carcinoma of skin 06/11/2019   Right posterior shoulder. SCCis, hypertrophic   Squamous cell carcinoma of skin 01/09/2020   Mid nasal dorsum, MOHS, Efudex x 4wks   Systolic dysfunction    a.) TTE 10/05/2015: EF 50-55%; mild LVH; LAE; triv AR, mild MR. b.) TTE 03/29/2018: EF 55-60%; LAE, mild AR; ? small  PFO. c.) TTE 06/09/2019: EF 55-60%, no rwma, triv MR/AI. d.) TTE 06/26/2021: EF 45-50%; glob HK; LAE; mild MR; Ao root 41 mm; asc Ao 39 mm.   Vasovagal syncope    Past Surgical History:  Procedure Laterality Date   CARDIAC CATHETERIZATION  1998   [place] no stents   CATARACT EXTRACTION, BILATERAL     COLON RESECTION SIGMOID N/A 12/07/2016   Procedure: COLON RESECTION SIGMOID;  Surgeon: Clayburn Pert, MD;  Location: [facility] ORS;  Service: General;  Laterality: N/A;   COLON SURGERY  11/2016   Colostomy   COLONOSCOPY  2016   COLONOSCOPY WITH PROPOFOL N/A 05/30/2016   Procedure: COLONOSCOPY WITH PROPOFOL;  Surgeon: Lollie Sails, MD;  Location: [facility]COPY;  Service: Endoscopy;  Laterality: N/A;   COLOSTOMY Left 12/07/2016   Procedure: COLOSTOMY;  Surgeon: Clayburn Pert, MD;  Location: [facility] ORS;  Service: General;  Laterality: Left;   COLOSTOMY REVERSAL N/A 03/21/2017   Procedure: COLOSTOMY REVERSAL;  Surgeon: Clayburn Pert, MD;  Location: [facility] ORS;  Service: General;  Laterality: N/A;   COLOSTOMY TAKEDOWN N/A 03/21/2017   Procedure: LAPAROSCOPIC COLOSTOMY TAKEDOWN;  Surgeon: Clayburn Pert, MD;  Location: [facility] ORS;  Service: General;  Laterality: N/A;   CYSTOSCOPY WITH STENT PLACEMENT Bilateral 03/21/2017   Procedure: CYSTOSCOPY WITH LIGHTED STENT PLACEMENT;  Surgeon: Abbie Sons, MD;  Location: [facility] ORS;  Service: Urology;  Laterality: Bilateral;   ESOPHAGOGASTRODUODENOSCOPY     ESOPHAGOGASTRODUODENOSCOPY N/A 05/30/2016   Procedure: ESOPHAGOGASTRODUODENOSCOPY (EGD);  Surgeon: Lollie Sails, MD;  Location: [facility] ENDOSCOPY;  Service: Endoscopy;  Laterality: N/A;   ESOPHAGOGASTRODUODENOSCOPY N/A 01/14/2021   Procedure: ESOPHAGOGASTRODUODENOSCOPY (EGD);  Surgeon: Toledo, Benay Pike, MD;  Location: [facility] ENDOSCOPY;  Service: Gastroenterology;  Laterality: N/A;   ESOPHAGOGASTRODUODENOSCOPY (EGD) WITH PROPOFOL N/A 04/28/2021   Procedure: ESOPHAGOGASTRODUODENOSCOPY (EGD)  WITH PROPOFOL;  Surgeon: Toledo, Benay Pike, MD;  Location: [facility] ENDOSCOPY;  Service: Gastroenterology;  Laterality: N/A;   EYE SURGERY     cataracts   FLEXIBLE SIGMOIDOSCOPY N/A 03/21/2017   Procedure: FLEXIBLE SIGMOIDOSCOPY;  Surgeon: Clayburn Pert, MD;  Location: [facility] ORS;  Service: General;  Laterality: N/A;   FLEXIBLE SIGMOIDOSCOPY N/A 06/24/2021   Procedure: Beryle Quant;  Surgeon: Jonathon Bellows, MD;  Location[facility];  Service: Gastroenterology;  Laterality: N/A;   FRACTURE SURGERY Bilateral    right arm and left wrist   INCISION AND DRAINAGE ABSCESS N/A 12/07/2016   Procedure: DRAINAGE  OF INTRA ABDOMINAL ABSCESS;  Surgeon: Clayburn Pert, MD;  Location: [facility];  Service: General;  Laterality: N/A;   LAPAROTOMY N/A 12/07/2016   Procedure: EXPLORATORY LAPAROTOMY;  Surgeon: Clayburn Pert, MD;  Location: [facility];  Service: General;  Laterality: N/A;   PROSTATE SURGERY     Microwave therapy   TONSILLECTOMY     TOTAL HIP ARTHROPLASTY Right 09/27/2021   Procedure: TOTAL HIP ARTHROPLASTY ANTERIOR APPROACH;  Surgeon: Lovell Sheehan, MD;  Location: [facility];  Service: Orthopedics;  Laterality: Right;   TOTAL KNEE ARTHROPLASTY Right 07/27/2016  Procedure: TOTAL KNEE ARTHROPLASTY;  Surgeon: [name], MD;  Location: [facility] ORS;  Service: Orthopedics;  Laterality: Right;  Dr. Erlene Quan had to place Urinary catheter due to prostate cancer history.  Using flexible scope.    Current Meds  Medication Sig   amLODipine (NORVASC) 5 MG tablet TAKE 1 TABLET BY MOUTH EVERY DAY   aspirin 81 MG chewable tablet Chew 1 tablet (81 mg total) by mouth 2 (two) times daily.   atorvastatin (LIPITOR) 40 MG tablet Take 1 tablet (40 mg total) by mouth daily.   docusate sodium (COLACE) 100 MG capsule Take 1 capsule (100 mg total) by mouth 2 (two) times daily. (Patient taking differently: Take 200 mg by mouth daily.)   fluticasone (FLONASE) 50 MCG/ACT nasal spray PLACE 1 SPRAY INTO BOTH  NOSTRILS DAILY AS NEEDED FOR ALLERGIES OR RHINITIS.   hydrocortisone 2.5 % cream APPLY TO AFFECTED AREA TWICE A DAY AS NEEDED FOR RASH   lisinopril-hydrochlorothiazide (ZESTORETIC) 10-12.5 MG tablet Take 1 tablet by mouth daily.   metoprolol tartrate (LOPRESSOR) 100 MG tablet TAKE 1 TABLET BY MOUTH TWICE A DAY   Multiple Vitamin (MULTIVITAMIN WITH MINERALS) TABS tablet Take 1 tablet by mouth daily.   nicotine polacrilex ([employer]S NICOTINE) 2 MG lozenge Take 1 lozenge (2 mg total) by mouth as needed for smoking cessation.   omeprazole (PRILOSEC) 20 MG capsule Take 20 mg by mouth. Reports currently taking as needed   sertraline (ZOLOFT) 50 MG tablet TAKE 1 TABLET BY MOUTH EVERY DAY   sildenafil (REVATIO) 20 MG tablet Take 1 tablet (20 mg total) by mouth daily.   vitamin B-12 (CYANOCOBALAMIN) 1000 MCG tablet Take 1,000 mcg by mouth daily.    Allergies: Formaldehyde  Social History   Tobacco Use   Smoking status: Former    Packs/day: 1.00    Years: 27.00    Total pack years: 27.00    Types: Cigarettes    Quit date: 04/18/1988    Years since quitting: 34.0   Smokeless tobacco: Former    Types: Chew    Quit date: 12/20/2021  Vaping Use   Vaping Use: Never used  Substance Use Topics   Alcohol use: Yes    Alcohol/week: 7.0 standard drinks of alcohol    Types: 7 Glasses of wine per week    Comment: 1 glass per day-Former heavy use ETOH   Drug use: No    Family History  Problem Relation Age of Onset   Lung cancer Father        smoker   Other Mother    Sudden death Son        due to "Blood clots"   Bipolar disorder Son    Heart disease Son    Kidney disease Daughter        congenital one small kidney   Prostate cancer Neg Hx    Bladder Cancer Neg Hx     Review of Systems: A 12-system review of systems was performed and was negative except as noted in the HPI.  --------------------------------------------------------------------------------------------------  Physical Exam: BP  130/80 (BP Location: Left Arm, Patient Position: Sitting, Cuff Size: Large)   Pulse 66   Ht '5\' 8"'$  (1.727 m)   Wt 218 lb (98.9 kg)   SpO2 97%   BMI 33.15 kg/m   General:  NAD. Neck: No JVD or HJR. Lungs: Clear to auscultation bilaterally without wheezes or crackles. Heart: Regular rate and rhythm without murmurs, rubs, or gallops. Abdomen: Soft, nontender, nondistended. Extremities: 1+ pretibial edema  bilaterally.  EKG: Normal sinus rhythm with left axis deviation and right bundle branch block.  No significant change from prior tracing on 08/23/2021.  Lab Results  Component Value Date   WBC 7.5 08/24/2021   HGB 11.0 (L) 08/24/2021   HCT 34.1 (L) 08/24/2021   MCV 90 08/24/2021   PLT 250 08/24/2021    Lab Results  Component Value Date   NA 140 08/24/2021   K 4.6 08/24/2021   CL 103 08/24/2021   CO2 27 08/24/2021   BUN 14 08/24/2021   CREATININE 1.37 (H) 08/24/2021   GLUCOSE 103 (H) 08/24/2021   ALT 12 08/24/2021    Lab Results  Component Value Date   CHOL 156 08/15/2019   HDL 46 08/15/2019   LDLCALC 85 08/15/2019   TRIG 144 08/15/2019   CHOLHDL 4.4 06/09/2019    --------------------------------------------------------------------------------------------------  ASSESSMENT AND PLAN: Lone atrial fibrillation: Mr. Dubuc has not had any recurrent atrial fibrillation, which was isolated to his episode of colitis last year.  Given his high bleeding risk and absence of recurrent atrial fibrillation, he is no longer on anticoagulation.  Will continue with this plan unless he were to have recurrent atrial fibrillation.  Heart failure with mildly reduced ejection fraction: Mr. Texidor most recent echo earlier this month showed normalization of LVEF.  He has mild leg edema that is chronic.  We will defer medication changes today, continuing with metoprolol and lisinopril-HCTZ.  Hypertension: BP upper normal today.  Continue current regimen of amlodipine, metoprolol, and  lisinopril-HCTZ.  Hyperlipidemia: Lipids well-controlled on last check in 2021.  He is scheduled for follow-up with [name] in April, which routine labs including a lipid panel should be checked.  Goal LDL < 70 with history of stroke.  Continue atorvastatin 40 mg daily pending repeat labs.  Urinary problem: I have encouraged Mr. Mcgraw to speak with his urologist about his concerns.  He has no cardiac contraindications to use of a PDE5 inhibitor, though I am not sure that this will alleviate his concerns.  Follow-up: Return to clinic in 6 month.  [name], MD 05/04/2022 4:13 PM

## 2022-05-16 DIAGNOSIS — Z96641 Presence of right artificial hip joint: Secondary | ICD-10-CM | POA: Diagnosis not present

## 2022-05-25 ENCOUNTER — Ambulatory Visit (INDEPENDENT_AMBULATORY_CARE_PROVIDER_SITE_OTHER): Payer: No Typology Code available for payment source

## 2022-05-25 VITALS — Ht 69.0 in | Wt 211.0 lb

## 2022-05-25 DIAGNOSIS — Z0000 Encounter for general adult medical examination without abnormal findings: Secondary | ICD-10-CM

## 2022-05-25 NOTE — Patient Instructions (Signed)
Cameron Foster , Thank you for taking time to come for your Medicare Wellness Visit. I appreciate your ongoing commitment to your health goals. Please review the following plan we discussed and let me know if I can assist you in the future.   These are the goals we discussed:  Goals      DIET - EAT MORE FRUITS AND VEGETABLES     DIET - INCREASE WATER INTAKE     Recommend to drink at least 6-8 8oz glasses of water per day.     Goal: CCM (Atrial Fibrillation) Expected Outcome:  Monitor, Self-Manage And Reduce Symptoms of Atrial Fibrillaton     Current Barriers:  Chronic Disease Management support and education needs related to Atrial Fibrillation  Planned Interventions: Reviewed plan for management of Atrial Fibrillation. Currently taking medication for rate control. Eliquis was previously recommended by the Cardiology team. Currently patient prefers not to start new medications. Will update the care management team if he opts to start new regimen and medication assistance is required.  Reviewed symptoms. Reports symptoms have been well controlled. Provided information regarding new and worsening symptoms that require immediate medical attention.  Counseled on importance of regular laboratory monitoring as prescribed. Screening for signs and symptoms of depression related to chronic disease state. Advised to complete appointment with the Cardiology team as scheduled.    Symptom Management: Monitor pulse (heart) rate daily Assess symptoms daily Take medications as prescribed Attend appointment with the Cardiology team as schedule Contact the clinic with new symptoms or concerns  Follow Up Plan:  Will follow up within the next month          Goal: CCM (Heart Failure) Expected Outcome:  Monitor, Self-Manage And Reduce Symptoms of Heart Failure     Current Barriers:  Chronic Disease Management support and education needs related to Heart Failure  Planned Interventions: Discussed current  plan for CHF management.  Provided information regarding recommended weight parameters. Encouraged to weigh daily and record readings.  Encouraged to notify provider for weight gain greater than 3 lbs overnight or weight gain greater than 5 lbs within a week.  Discussed s/sx of fluid overload and indications for notifying a provider. Denies abdominal edema or shortness of breath. Reports edema in his right leg. Reports this has been a chronic issue for several years. Recalls previously completing examination and ultrasound to r/o DVT. Denies pain, redness, or warmth to the extremity. Verbalized understanding of indications for notifying a provider.  Experiences exertional dyspnea. Reports occasional swelling in the right leg. Reports discussing with the Cardiology team. Encouraged to assess symptoms daily and contact clinic with changes. Discussed nutritional intake. Advised to closely monitor sodium consumption and avoid highly processed foods when possible. Reviewed worsening s/sx related to CHF exacerbation and indications for seeking immediate medical attention.     Wt Readings from Last 3 Encounters:  02/25/22 200 lb (90.7 kg)  12/28/21 208 lb (94.3 kg)  09/27/21 203 lb (92.1 kg)   Symptom Management: Take medications as prescribed   Attend all scheduled provider appointments Call pharmacy for medication refills 3-7 days in advance of running out of medications Monitor weight at least once a week if unable to monitor daily Record weights  Call office for weight gain more than 3 pounds in one day or 5 pounds in one week Watch for swelling in feet, ankles and legs every day Use salt in moderation Call provider office for new concerns or questions   Follow Up Plan:  Will follow up within the next month            Goal: CCM (Hypertension) Expected Outcome:  Monitor, Self-Manage And Reduce Symptoms of Hypertension     Current Barriers:  Chronic Disease Management support and  education needs related to Hypertension  Planned Interventions: Reviewed current treatment plan related to Hypertension, self-management, and patient's adherence to plan as established by provider.  Reviewed medications and indications for use. Reports taking all medications as prescribed. Reports tolerating current regimen well. Denies current concerns related to medication management and prescription cost. Provided information regarding established blood pressure parameters along with indications for notifying a provider. Reports recent BP readings have been lower with systolic readings in the 34V and diastolic readings in the 42V. Advised to record BP readings and notify provider. Reviewed symptoms. Denies chest pain or palpitations. Denies episodes of dizziness or feeling faint despite lower readings. Reviewed symptoms that require medical follow up.  Discussed activity tolerance. Ability to engage in regular exercise is currently limited d/t lower extremity weakness. Currently receiving physical therapy. Thorough review of safety and fall prevention measures. Advised to use additional caution d/t lower blood pressure readings. Reports having a walker available.  Discussed compliance with recommended cardiac prudent diet. Encouraged to read nutrition labels, monitor sodium intake, and avoid highly processed foods when possible. Denies need for nutritional resources. Reviewed pending appointments. Advised to follow up with the Cardiology team on 05/04/22 as scheduled. Reviewed s/sx of heart attack, stroke and worsening symptoms that require immediate medical attention.   BP Readings from Last 3 Encounters:  04/14/22 (!) 92/51  02/25/22 (!) 160/99  12/28/21 108/60   Symptom Management: Take medications as prescribed   Attend all scheduled provider appointments Call pharmacy for medication refills 3-7 days in advance of running out of medications Check blood pressure daily Keep a blood  pressure log Eat more whole grains, fruits and vegetables, lean meats and healthy fats Limit salt intake  Follow recommended fall and safety precautions  Call provider office for new concerns or questions   Follow Up Plan:  Will follow up within the next month                Manage My Medicine     Timeframe:  Long-Range Goal Priority:  High Start Date: 02/25/2021                            Expected End Date: 02/25/2022                      Follow Up within 90 days   - call for medicine refill 2 or 3 days before it runs out - keep a list of all the medicines I take; vitamins and herbals too - use a pillbox to sort medicine    Why is this important?   These steps will help you keep on track with your medicines.   Notes:         This is a list of the screening recommended for you and due dates:  Health Maintenance  Topic Date Due   COVID-19 Vaccine (6 - 2023-24 season) 03/21/2022   Zoster (Shingles) Vaccine (2 of 2) 03/21/2022   Medicare Annual Wellness Visit  05/26/2023   DTaP/Tdap/Td vaccine (2 - Td or Tdap) 01/13/2030   Pneumonia Vaccine  Completed   Flu Shot  Completed   HPV Vaccine  Aged Out   Colon Cancer  Screening  Discontinued    Advanced directives: yes  Conditions/risks identified: low all risk  Next appointment: Follow up in one year for your annual wellness visit. 05/31/2023@ 2pm/telephone  Preventive Care 65 Years and Older, Male  Preventive care refers to lifestyle choices and visits with your health care provider that can promote health and wellness. What does preventive care include? A yearly physical exam. This is also called an annual well check. Dental exams once or twice a year. Routine eye exams. Ask your health care provider how often you should have your eyes checked. Personal lifestyle choices, including: Daily care of your teeth and gums. Regular physical activity. Eating a healthy diet. Avoiding tobacco and drug use. Limiting  alcohol use. Practicing safe sex. Taking low doses of aspirin every day. Taking vitamin and mineral supplements as recommended by your health care provider. What happens during an annual well check? The services and screenings done by your health care provider during your annual well check will depend on your age, overall health, lifestyle risk factors, and family history of disease. Counseling  Your health care provider may ask you questions about your: Alcohol use. Tobacco use. Drug use. Emotional well-being. Home and relationship well-being. Sexual activity. Eating habits. History of falls. Memory and ability to understand (cognition). Work and work Statistician. Screening  You may have the following tests or measurements: Height, weight, and BMI. Blood pressure. Lipid and cholesterol levels. These may be checked every 5 years, or more frequently if you are over 36 years old. Skin check. Lung cancer screening. You may have this screening every year starting at age 74 if you have a 30-pack-year history of smoking and currently smoke or have quit within the past 15 years. Fecal occult blood test (FOBT) of the stool. You may have this test every year starting at age 23. Flexible sigmoidoscopy or colonoscopy. You may have a sigmoidoscopy every 5 years or a colonoscopy every 10 years starting at age 69. Prostate cancer screening. Recommendations will vary depending on your family history and other risks. Hepatitis C blood test. Hepatitis B blood test. Sexually transmitted disease (STD) testing. Diabetes screening. This is done by checking your blood sugar (glucose) after you have not eaten for a while (fasting). You may have this done every 1-3 years. Abdominal aortic aneurysm (AAA) screening. You may need this if you are a current or former smoker. Osteoporosis. You may be screened starting at age 97 if you are at high risk. Talk with your health care provider about your test results,  treatment options, and if necessary, the need for more tests. Vaccines  Your health care provider may recommend certain vaccines, such as: Influenza vaccine. This is recommended every year. Tetanus, diphtheria, and acellular pertussis (Tdap, Td) vaccine. You may need a Td booster every 10 years. Zoster vaccine. You may need this after age 72. Pneumococcal 13-valent conjugate (PCV13) vaccine. One dose is recommended after age 84. Pneumococcal polysaccharide (PPSV23) vaccine. One dose is recommended after age 63. Talk to your health care provider about which screenings and vaccines you need and how often you need them. This information is not intended to replace advice given to you by your health care provider. Make sure you discuss any questions you have with your health care provider. Document Released: 05/01/2015 Document Revised: 12/23/2015 Document Reviewed: 02/03/2015 Elsevier Interactive Patient Education  2017 [employer] Prevention in the Home Falls can cause injuries. They can happen to people of all ages. There are many  things you can do to make your home safe and to help prevent falls. What can I do on the outside of my home? Regularly fix the edges of walkways and driveways and fix any cracks. Remove anything that might make you trip as you walk through a door, such as a raised step or threshold. Trim any bushes or trees on the path to your home. Use bright outdoor lighting. Clear any walking paths of anything that might make someone trip, such as rocks or tools. Regularly check to see if handrails are loose or broken. Make sure that both sides of any steps have handrails. Any raised decks and porches should have guardrails on the edges. Have any leaves, snow, or ice cleared regularly. Use sand or salt on walking paths during winter. Clean up any spills in your garage right away. This includes oil or grease spills. What can I do in the bathroom? Use night  lights. Install grab bars by the toilet and in the tub and shower. Do not use towel bars as grab bars. Use non-skid mats or decals in the tub or shower. If you need to sit down in the shower, use a plastic, non-slip stool. Keep the floor dry. Clean up any water that spills on the floor as soon as it happens. Remove soap buildup in the tub or shower regularly. Attach bath mats securely with double-sided non-slip rug tape. Do not have throw rugs and other things on the floor that can make you trip. What can I do in the bedroom? Use night lights. Make sure that you have a light by your bed that is easy to reach. Do not use any sheets or blankets that are too big for your bed. They should not hang down onto the floor. Have a firm chair that has side arms. You can use this for support while you get dressed. Do not have throw rugs and other things on the floor that can make you trip. What can I do in the kitchen? Clean up any spills right away. Avoid walking on wet floors. Keep items that you use a lot in easy-to-reach places. If you need to reach something above you, use a strong step stool that has a grab bar. Keep electrical cords out of the way. Do not use floor polish or wax that makes floors slippery. If you must use wax, use non-skid floor wax. Do not have throw rugs and other things on the floor that can make you trip. What can I do with my stairs? Do not leave any items on the stairs. Make sure that there are handrails on both sides of the stairs and use them. Fix handrails that are broken or loose. Make sure that handrails are as long as the stairways. Check any carpeting to make sure that it is firmly attached to the stairs. Fix any carpet that is loose or worn. Avoid having throw rugs at the top or bottom of the stairs. If you do have throw rugs, attach them to the floor with carpet tape. Make sure that you have a light switch at the top of the stairs and the bottom of the stairs. If  you do not have them, ask someone to add them for you. What else can I do to help prevent falls? Wear shoes that: Do not have high heels. Have rubber bottoms. Are comfortable and fit you well. Are closed at the toe. Do not wear sandals. If you use a stepladder: Make sure that it  is fully opened. Do not climb a closed stepladder. Make sure that both sides of the stepladder are locked into place. Ask someone to hold it for you, if possible. Clearly mark and make sure that you can see: Any grab bars or handrails. First and last steps. Where the edge of each step is. Use tools that help you move around (mobility aids) if they are needed. These include: Canes. Walkers. Scooters. Crutches. Turn on the lights when you go into a dark area. Replace any light bulbs as soon as they burn out. Set up your furniture so you have a clear path. Avoid moving your furniture around. If any of your floors are uneven, fix them. If there are any pets around you, be aware of where they are. Review your medicines with your doctor. Some medicines can make you feel dizzy. This can increase your chance of falling. Ask your doctor what other things that you can do to help prevent falls. This information is not intended to replace advice given to you by your health care provider. Make sure you discuss any questions you have with your health care provider. Document Released: 01/29/2009 Document Revised: 09/10/2015 Document Reviewed: 05/09/2014 Elsevier Interactive Patient Education  2017 [employer].

## 2022-05-25 NOTE — Progress Notes (Signed)
Virtual Visit via Telephone Note  I connected with  Cameron Foster. on 05/25/22 at  2:30 PM EST by telephone and verified that I am speaking with the correct person using two identifiers.  Location: Patient: home  Provider: [facility] Persons participating in the virtual visit: patient/Nurse Health Advisor   I discussed the limitations, risks, security and privacy concerns of performing an evaluation and management service by telephone and the availability of in person appointments. The patient expressed understanding and agreed to proceed.  Interactive audio and video telecommunications were attempted between this nurse and patient, however failed, due to patient having technical difficulties OR patient did not have access to video capability.  We continued and completed visit with audio only.  Some vital signs may be absent or patient reported.   Roger Shelter, LPN  Subjective:   Cameron Foster. is a 82 y.o. male who presents for Medicare Annual/Subsequent preventive examination.  Review of Systems    Cardiac Risk Factors include: hypertension;dyslipidemia;advanced age (>67mn, >>31women);male gender;sedentary lifestyle;obesity (BMI >30kg/m2)    Objective:    Today's Vitals   05/25/22 1439  Weight: 211 lb (95.7 kg)  Height: '5\' 9"'$  (1.753 m)   Body mass index is 31.16 kg/m.     05/25/2022    2:12 PM 09/27/2021    5:22 PM 09/21/2021    2:38 PM 07/02/2021    8:00 PM 06/24/2021    9:10 AM 06/23/2021   11:00 AM 06/21/2021    8:34 PM  Advanced Directives  Does Patient Have a Medical Advance Directive? Yes Yes Yes Yes Yes Yes Yes  Type of AParamedicof [place]Living will [place]len Ull[place]Living will [place] will   [place]  Does patient want to make changes to medical advance directive?  No - Patient declined  No - Patient declined  No - Patient declined   [place] of [place]  Chart? Yes - validated most recent copy scanned in chart (See row information)     Yes - validated most recent copy scanned in chart (See row information)     Current Medications (verified) Outpatient Encounter Medications as of 05/25/2022  Medication Sig   amLODipine (NORVASC) 5 MG tablet TAKE 1 TABLET BY MOUTH EVERY DAY   aspirin 81 MG chewable tablet Chew 1 tablet (81 mg total) by mouth 2 (two) times daily.   atorvastatin (LIPITOR) 40 MG tablet Take 1 tablet (40 mg total) by mouth daily.   docusate sodium (COLACE) 100 MG capsule Take 1 capsule (100 mg total) by mouth 2 (two) times daily. (Patient taking differently: Take 200 mg by mouth daily.)   fluticasone (FLONASE) 50 MCG/ACT nasal spray PLACE 1 SPRAY INTO BOTH NOSTRILS DAILY AS NEEDED FOR ALLERGIES OR RHINITIS.   hydrocortisone 2.5 % cream APPLY TO AFFECTED AREA TWICE A DAY AS NEEDED FOR RASH   lisinopril-hydrochlorothiazide (ZESTORETIC) 10-12.5 MG tablet Take 1 tablet by mouth daily.   metoprolol tartrate (LOPRESSOR) 100 MG tablet TAKE 1 TABLET BY MOUTH TWICE A DAY   Multiple Vitamin (MULTIVITAMIN WITH MINERALS) TABS tablet Take 1 tablet by mouth daily.   nicotine polacrilex ([employer] NICOTINE) 2 MG lozenge Take 1 lozenge (2 mg total) by mouth as needed for smoking cessation.   omeprazole (PRILOSEC) 20 MG capsule Take 20 mg by mouth. Reports currently taking as needed   sertraline (ZOLOFT) 50 MG tablet TAKE 1 TABLET BY MOUTH EVERY DAY  vitamin B-12 (CYANOCOBALAMIN) 1000 MCG tablet Take 1,000 mcg by mouth daily.   sildenafil (REVATIO) 20 MG tablet Take 1 tablet (20 mg total) by mouth daily. (Patient not taking: Reported on 05/25/2022)   No facility-administered encounter medications on file as of 05/25/2022.    Allergies (verified) Formaldehyde   History: Past Medical History:  Diagnosis Date   Alcoholism ([place])    Aortic atherosclerosis ([place])    Arthritis    Ascending aorta dilation ([place]) 03/28/2018   a.) Vascular US: prox asc Ao  measured 29 mm. b.)  CT CAP 06/22/2021: Ao root 41 mm. c.) TTE 06/26/2021: Ao root 41 mm; asc Ao 39 mm   Atrial fibrillation (HCC) 1995   a.) single episode in 1995 per patient; no long term treatment. b.) recurrent episode in the setting of GI bleeding related to colitis 07/04/21.   Basal cell carcinoma 04/26/2017   Right medial cheek. Superficial and nodular   Basal cell carcinoma 06/11/2019   Left anterior shoulder. Nodular pattern   Basal cell carcinoma 09/09/2019   Right nasal ala, EDC   Basal cell carcinoma 02/05/2020   L upper eyebrow, EDC    Bilateral carotid artery stenosis 06/09/2019   a.) Carotid doppler: mod; <50% BILATERAL ICAs.   Chicken pox    Colon cancer (HCC)    Colon polyp    Depression    Son died 05-Jul-2014   Diverticulitis    Diverticulosis 30 years   Gastritis    GERD (gastroesophageal reflux disease)    H. pylori infection    History of stress test    a. 09/2015 MV: No ischemia/infarct. EF 45-54% (nl by echo).   Hyperlipidemia    Hypertension    Irritable bowel syndrome    Lacunar infarction ([place]) 06/08/2019   a.) small; RIGHT motor strip   NSVT (nonsustained ventricular tachycardia) (HCC)    a.)  Single episode lasting 5 beats at a maximum rate of 160 bpm noted on Holter study performed 08/13/2021.   Prostate cancer ([place]) July 05, 2010   a.) s/p XRT   PSVT (paroxysmal supraventricular tachycardia)    a. 07-05-2019 Zio: Avg rate 74 (54-120), occas PACs, rare PVCs, 125 episodes of PVCs (longest 17.5 secs; max rate 187). No afib.   RBBB    SAH (subarachnoid hemorrhage) ([place]) 10/10/2018   Squamous cell carcinoma of skin 07/18/2017   Left medial calf. KA type   Squamous cell carcinoma of skin 04/24/2018   Right above med. brow   Squamous cell carcinoma of skin 06/11/2019   Right posterior shoulder. SCCis, hypertrophic   Squamous cell carcinoma of skin 01/09/2020   Mid nasal dorsum, MOHS, Efudex x 4wks   Systolic dysfunction    a.) TTE 10/05/2015: EF 50-55%; mild LVH;  LAE; triv AR, mild MR. b.) TTE 03/29/2018: EF 55-60%; LAE, mild AR; ? small PFO. c.) TTE 06/09/2019: EF 55-60%, no rwma, triv MR/AI. d.) TTE 06/26/2021: EF 45-50%; glob HK; LAE; mild MR; Ao root 41 mm; asc Ao 39 mm.   Vasovagal syncope    Past Surgical History:  Procedure Laterality Date   CARDIAC CATHETERIZATION  July 04, 1996   [place] no stents   CATARACT EXTRACTION, BILATERAL     COLON RESECTION SIGMOID N/A 12/07/2016   Procedure: COLON RESECTION SIGMOID;  Surgeon: Clayburn Pert, MD;  Location: [facility] ORS;  Service: General;  Laterality: N/A;   COLON SURGERY  11/2016   Colostomy   COLONOSCOPY  Jul 05, 2014   COLONOSCOPY WITH PROPOFOL N/A 05/30/2016   Procedure: COLONOSCOPY WITH  PROPOFOL;  Surgeon: Lollie Sails, MD;  Location: [facility] ENDOSCOPY;  Service: Endoscopy;  Laterality: N/A;   COLOSTOMY Left 12/07/2016   Procedure: COLOSTOMY;  Surgeon: Clayburn Pert, MD;  Location: [facility] ORS;  Service: General;  Laterality: Left;   COLOSTOMY REVERSAL N/A 03/21/2017   Procedure: COLOSTOMY REVERSAL;  Surgeon: Clayburn Pert, MD;  Location: [facility] ORS;  Service: General;  Laterality: N/A;   COLOSTOMY TAKEDOWN N/A 03/21/2017   Procedure: LAPAROSCOPIC COLOSTOMY TAKEDOWN;  Surgeon: Clayburn Pert, MD;  Location: [facility] ORS;  Service: General;  Laterality: N/A;   CYSTOSCOPY WITH STENT PLACEMENT Bilateral 03/21/2017   Procedure: CYSTOSCOPY WITH LIGHTED STENT PLACEMENT;  Surgeon: Abbie Sons, MD;  Location: [facility] ORS;  Service: Urology;  Laterality: Bilateral;   ESOPHAGOGASTRODUODENOSCOPY     ESOPHAGOGASTRODUODENOSCOPY N/A 05/30/2016   Procedure: ESOPHAGOGASTRODUODENOSCOPY (EGD);  Surgeon: Lollie Sails, MD;  Location: [facility] ENDOSCOPY;  Service: Endoscopy;  Laterality: N/A;   ESOPHAGOGASTRODUODENOSCOPY N/A 01/14/2021   Procedure: ESOPHAGOGASTRODUODENOSCOPY (EGD);  Surgeon: Toledo, Benay Pike, MD;  Location: [facility] ENDOSCOPY;  Service: Gastroenterology;  Laterality: N/A;   ESOPHAGOGASTRODUODENOSCOPY (EGD)  WITH PROPOFOL N/A 04/28/2021   Procedure: ESOPHAGOGASTRODUODENOSCOPY (EGD) WITH PROPOFOL;  Surgeon: Toledo, Benay Pike, MD;  Location: [facility] ENDOSCOPY;  Service: Gastroenterology;  Laterality: N/A;   EYE SURGERY     cataracts   FLEXIBLE SIGMOIDOSCOPY N/A 03/21/2017   Procedure: FLEXIBLE SIGMOIDOSCOPY;  Surgeon: Clayburn Pert, MD;  Location: [facility] ORS;  Service: General;  Laterality: N/A;   FLEXIBLE SIGMOIDOSCOPY N/A 06/24/2021   Procedure: Beryle Quant;  Surgeon: Jonathon Bellows, MD;  Location: [facility] ENDOSCOPY;  Service: Gastroenterology;  Laterality: N/A;   FRACTURE SURGERY Bilateral    right arm and left wrist   INCISION AND DRAINAGE ABSCESS N/A 12/07/2016   Procedure: DRAINAGE  OF INTRA ABDOMINAL ABSCESS;  Surgeon: Clayburn Pert, MD;  Location: [facility] ORS;  Service: General;  Laterality: N/A;   LAPAROTOMY N/A 12/07/2016   Procedure: EXPLORATORY LAPAROTOMY;  Surgeon: Clayburn Pert, MD;  Location: [facility] ORS;  Service: General;  Laterality: N/A;   PROSTATE SURGERY     Microwave therapy   TONSILLECTOMY     TOTAL HIP ARTHROPLASTY Right 09/27/2021   Procedure: TOTAL HIP ARTHROPLASTY ANTERIOR APPROACH;  Surgeon: Lovell Sheehan, MD;  Location: [facility] ORS;  Service: Orthopedics;  Laterality: Right;   TOTAL KNEE ARTHROPLASTY Right 07/27/2016   Procedure: TOTAL KNEE ARTHROPLASTY;  Surgeon: [name], MD;  Location: [facility] ORS;  Service: Orthopedics;  Laterality: Right;  Dr. Erlene Quan had to place Urinary catheter due to prostate cancer history.  Using flexible scope.   Family History  Problem Relation Age of Onset   Lung cancer Father        smoker   Other Mother    Sudden death Son        due to "Blood clots"   Bipolar disorder Son    Heart disease Son    Kidney disease Daughter        congenital one small kidney   Prostate cancer Neg Hx    Bladder Cancer Neg Hx    Social History   Socioeconomic History   Marital status: Married    Spouse name: [name]   Number of children: 2    Years of education: Not on file   Highest education level: Bachelor's degree (e.g., BA, AB, BS)  Occupational History   Occupation: retired Dance movement psychotherapist  Tobacco Use   Smoking status: Former    Packs/day: 1.00    Years: 27.00    Total pack  years: 27.00    Types: Cigarettes    Quit date: 04/18/1988    Years since quitting: 34.1   Smokeless tobacco: Former    Types: Chew    Quit date: 12/20/2021  Vaping Use   Vaping Use: Never used  Substance and Sexual Activity   Alcohol use: Yes    Alcohol/week: 7.0 standard drinks of alcohol    Types: 7 Glasses of wine per week    Comment: 1 glass per day-Former heavy use ETOH   Drug use: No   Sexual activity: Not Currently    Birth control/protection: None    Comment: Married  Other Topics Concern   Not on file  Social History Narrative   Lives with wife   Social Determinants of Health   Financial Resource Strain: Low Risk  (05/25/2022)   Overall Financial Resource Strain (CARDIA)    Difficulty of Paying Living Expenses: Not hard at all  Food Insecurity: No Food Insecurity (05/25/2022)   Hunger Vital Sign    Worried About Running Out of Food in the Last Year: Never true    Ran Out of Food in the Last Year: Never true  Transportation Needs: No Transportation Needs (05/25/2022)   PRAPARE - Hydrologist (Medical): No    Lack of Transportation (Non-Medical): No  Physical Activity: Insufficiently Active (05/25/2022)   Exercise Vital Sign    Days of Exercise per Week: 2 days    Minutes of Exercise per Session: 20 min  Stress: No Stress Concern Present (05/25/2022)   [place]    Feeling of Stress : Not at all  Social Connections: Moderately Integrated (05/25/2022)   Social Connection and Isolation Panel [NHANES]    Frequency of Communication with Friends and Family: More than three times a week    Frequency of Social Gatherings with Friends and Family:  Three times a week    Attends Religious Services: More than 4 times per year    Active Member of Clubs or Organizations: No    Attends Archivist Meetings: Never    Marital Status: Married    Tobacco Counseling Counseling given: Not Answered   Clinical Intake:  Pre-visit preparation completed: Yes  Pain : No/denies pain     BMI - recorded: 31.16 Nutritional Status: BMI > 30  Obese Nutritional Risks: None Diabetes: No  How often do you need to have someone help you when you read instructions, pamphlets, or other written materials from your doctor or pharmacy?: 1 - Never  Diabetic?no  Interpreter Needed?: No  Information entered by :: B.Darrion Wyszynski,LPN   Activities of Daily Living    05/25/2022    3:09 PM 05/25/2022    2:57 PM  In your present state of health, do you have any difficulty performing the following activities:  Hearing?  1  Vision?  0  Difficulty concentrating or making decisions?  1  Comment  sometimes  Walking or climbing stairs?  1  Dressing or bathing?  0  Doing errands, shopping?  0  Preparing Food and eating ? N   Using the Toilet? N   In the past six months, have you accidently leaked urine? N   Do you have problems with loss of bowel control? N   Managing your Medications? N   Managing your Finances? N   Housekeeping or managing your Housekeeping? N   Comment wife does mostly     Patient Care Team: [place],  Dionne Bucy, MD as PCP - General (Family Medicine) End, Harrell Gave, MD as PCP - Cardiology (Cardiology) End, Harrell Gave, MD as Consulting Physician (Cardiology) [name], MD (Orthopedic Surgery) Schnier, Dolores Lory, MD (Vascular Surgery) [name], MD (Dermatology) [name], MD (Inactive) as Consulting Physician (Ophthalmology) [name], [facility] (Pharmacist) Neldon Labella, RN as Case Manager  Indicate any recent Medical Services you may have received from other than [facility] providers in the past year  (date may be approximate).     Assessment:   This is a routine wellness examination for [facility].  Hearing/Vision screen Hearing Screening - Comments:: Slight hearing loss.Marland Kitchenadequate hearing Vision Screening - Comments:: Cataract surgery years HYQ:MVHQIO adequate. [facility]  Dietary issues and exercise activities discussed: Current Exercise Habits: The patient does not participate in regular exercise at present, Exercise limited by: orthopedic condition(s) (s/p hip replacement;says legs not strong)   Goals Addressed             This Visit's Progress    DIET - EAT MORE FRUITS AND VEGETABLES   On track    DIET - INCREASE WATER INTAKE   Not on track    Recommend to drink at least 6-8 8oz glasses of water per day.     Goal: CCM (Hypertension) Expected Outcome:  Monitor, Self-Manage And Reduce Symptoms of Hypertension   On track    Current Barriers:  Chronic Disease Management support and education needs related to Hypertension  Planned Interventions: Reviewed current treatment plan related to Hypertension, self-management, and patient's adherence to plan as established by provider.  Reviewed medications and indications for use. Reports taking all medications as prescribed. Reports tolerating current regimen well. Denies current concerns related to medication management and prescription cost. Provided information regarding established blood pressure parameters along with indications for notifying a provider. Reports recent BP readings have been lower with systolic readings in the 96E and diastolic readings in the 95M. Advised to record BP readings and notify provider. Reviewed symptoms. Denies chest pain or palpitations. Denies episodes of dizziness or feeling faint despite lower readings. Reviewed symptoms that require medical follow up.  Discussed activity tolerance. Ability to engage in regular exercise is currently limited d/t lower extremity weakness. Currently receiving physical  therapy. Thorough review of safety and fall prevention measures. Advised to use additional caution d/t lower blood pressure readings. Reports having a walker available.  Discussed compliance with recommended cardiac prudent diet. Encouraged to read nutrition labels, monitor sodium intake, and avoid highly processed foods when possible. Denies need for nutritional resources. Reviewed pending appointments. Advised to follow up with the Cardiology team on 05/04/22 as scheduled. Reviewed s/sx of heart attack, stroke and worsening symptoms that require immediate medical attention.   BP Readings from Last 3 Encounters:  04/14/22 (!) 92/51  02/25/22 (!) 160/99  12/28/21 108/60   Symptom Management: Take medications as prescribed   Attend all scheduled provider appointments Call pharmacy for medication refills 3-7 days in advance of running out of medications Check blood pressure daily Keep a blood pressure log Eat more whole grains, fruits and vegetables, lean meats and healthy fats Limit salt intake  Follow recommended fall and safety precautions  Call provider office for new concerns or questions   Follow Up Plan:  Will follow up within the next month                Manage My Medicine   On track    Timeframe:  Long-Range Goal Priority:  High Start Date: 02/25/2021  Expected End Date: 02/25/2022                      Follow Up within 90 days   - call for medicine refill 2 or 3 days before it runs out - keep a list of all the medicines I take; vitamins and herbals too - use a pillbox to sort medicine    Why is this important?   These steps will help you keep on track with your medicines.   Notes:        Depression Screen    05/25/2022    2:53 PM 04/25/2022    3:50 PM 04/14/2022    4:15 PM 12/28/2021    1:58 PM 07/06/2021    1:10 PM 06/08/2021    3:04 PM 05/24/2021    1:59 PM  PHQ 2/9 Scores  PHQ - 2 Score '1 1 1 1 2 5 '$ 0  PHQ- 9 Score '3 12  11 8  9     '$ Fall Risk    05/25/2022    2:44 PM 04/25/2022    3:49 PM 04/14/2022   11:49 PM 12/28/2021    1:57 PM 05/24/2021    2:01 PM  [place] in the past year? 0 0 0 1 0  Number falls in past yr: 0 0 0 0 0  Injury with Fall? 0 0 0 0 0  Risk for fall due to :  History of fall(s) Medication side effect;Impaired balance/gait;Orthopedic patient History of fall(s) No Fall Risks  Follow up Falls prevention discussed;Education provided Falls evaluation completed Falls prevention discussed  Falls evaluation completed    FALL RISK PREVENTION PERTAINING TO THE HOME:  Any stairs in or around the home? No  If so, are there any without handrails? No  Home free of loose throw rugs in walkways, pet beds, electrical cords, etc? Yes  Adequate lighting in your home to reduce risk of falls? Yes   ASSISTIVE DEVICES UTILIZED TO PREVENT FALLS:  Life alert? No  Use of a cane, walker or w/c? Yes cane sometimes Grab bars in the bathroom? No  Shower chair or bench in shower? Yes dont need it Elevated toilet seat or a handicapped toilet? No   Cognitive Function:        05/25/2022    2:59 PM 09/02/2016    3:45 PM  6CIT Screen  What Year? 0 points 0 points  What month? 0 points 0 points  What time? 0 points 0 points  Count back from 20 0 points 0 points  Months in reverse 0 points 0 points  Repeat phrase 2 points 0 points  Total Score 2 points 0 points    Immunizations Immunization History  Administered Date(s) Administered   COVID-19, mRNA, vaccine(Comirnaty)12 years and older 01/24/2022   Fluad Quad(high Dose 65+) 01/14/2020, 01/26/2021, 12/28/2021   Influenza, High Dose Seasonal PF 01/26/2017, 02/01/2018, 01/31/2019   Influenza,inj,Quad PF,6+ Mos 02/08/2016   Influenza,inj,quad, With Preservative 12/18/2014   PFIZER Comirnaty(Gray Top)Covid-19 Tri-Sucrose Vaccine 08/24/2020   PFIZER(Purple Top)SARS-COV-2 Vaccination 05/31/2019, 06/25/2019, 01/22/2020   Pneumococcal Conjugate-13  05/14/2018   Pneumococcal Polysaccharide-23 12/18/2014   Pneumococcal-Unspecified 12/18/2014   Tdap 01/14/2020   Zoster Recombinat (Shingrix) 01/24/2022    TDAP status: Up to date  Flu Vaccine status: Up to date  Pneumococcal vaccine status: Up to date  Covid-19 vaccine status: Completed vaccines  Qualifies for Shingles Vaccine? Yes   Zostavax completed No   Shingrix Completed?: No.  Education has been provided regarding the importance of this vaccine. Patient has been advised to call insurance company to determine out of pocket expense if they have not yet received this vaccine. Advised may also receive vaccine at local pharmacy or Health Dept. Verbalized acceptance and understanding.  Screening Tests Health Maintenance  Topic Date Due   COVID-19 Vaccine (6 - 2023-24 season) 03/21/2022   Zoster Vaccines- Shingrix (2 of 2) 03/21/2022   Medicare Annual Wellness (AWV)  05/26/2023   DTaP/Tdap/Td (2 - Td or Tdap) 01/13/2030   Pneumonia Vaccine 85+ Years old  Completed   INFLUENZA VACCINE  Completed   HPV VACCINES  Aged Out   COLONOSCOPY (Pts 45-74yr Insurance coverage will need to be confirmed)  Discontinued    Health Maintenance  Health Maintenance Due  Topic Date Due   COVID-19 Vaccine (6 - 2023-24 season) 03/21/2022   Zoster Vaccines- Shingrix (2 of 2) 03/21/2022    Colorectal cancer screening: No longer required.   Lung Cancer Screening: (Low Dose CT Chest recommended if Age 82-80years, 30 pack-year currently smoking OR have quit w/in 15years.) does not qualify.   Lung Cancer Screening Referral: no  Additional Screening:  Hepatitis C Screening: does not qualify; Completed no  Vision Screening: Recommended annual ophthalmology exams for early detection of glaucoma and other disorders of the eye. Is the patient up to date with their annual eye exam?  No  Who is the provider or what is the name of the office in which the patient attends annual eye exams? [facility];will make appt If pt is not established with a provider, would they like to be referred to a provider to establish care? No .   Dental Screening: Recommended annual dental exams for proper oral hygiene  Community Resource Referral / Chronic Care Management: CRR required this visit?  No   CCM required this visit?  No      Plan:     I have personally reviewed and noted the following in the patient's chart:   Medical and social history Use of alcohol, tobacco or illicit drugs  Current medications and supplements including opioid prescriptions. Patient is not currently taking opioid prescriptions. Functional ability and status Nutritional status Physical activity Advanced directives List of other physicians Hospitalizations, surgeries, and ER visits in previous 12 months Vitals Screenings to include cognitive, depression, and falls Referrals and appointments  In addition, I have reviewed and discussed with patient certain preventive protocols, quality metrics, and best practice recommendations. A written personalized care plan for preventive services as well as general preventive health recommendations were provided to patient.     BRoger Shelter LPN   25/04/8839  Nurse Notes: pt states he is doing alright except legs after hip surgery last year not strong as he wants. Pt states PT sessions did not help much. Pt inquires what other therapy available.

## 2022-06-27 ENCOUNTER — Ambulatory Visit: Payer: No Typology Code available for payment source

## 2022-06-27 DIAGNOSIS — I10 Essential (primary) hypertension: Secondary | ICD-10-CM

## 2022-06-27 DIAGNOSIS — Z720 Tobacco use: Secondary | ICD-10-CM

## 2022-06-27 NOTE — Progress Notes (Signed)
Care Management & Coordination Services Pharmacy Note  06/27/2022 Name:  Cameron Foster. MRN:  [id number] DOB:  02-14-1941  Summary: Patient presents for follow-up consult.   Worsening weakness in legs since hip surgery. Feels low endurance overall. Denies any falls in the last 30 days.   -Continues to utilize nicotine lozenges ~3x daily. Reports doing well his avoidance of tobacco products. Discussed working on utilizing alternative strategies to manage post-meal cravings (going for a walk, stepping outside, chewing gum) rather than always relying on nicotine lozenges.   Recommendations/Changes made from today's visit: Continue current medications  Follow up plan: CPP follow-up 6 months   Subjective: Cameron Foster. is an 82 y.o. year old male who is a primary patient of Bacigalupo, Dionne Bucy, MD.  The care coordination team was consulted for assistance with disease management and care coordination needs.    Engaged with patient by telephone for follow up visit.  Recent office visits: 04/25/22: Patient presented to [name] for follow-up.   Recent consult visits: 05/04/22: Patient presented to Dr. Saunders Revel (Cardiology)  02/25/22: Patient presented to Christell Faith, PA-C (Cardiology) for follow-up.    Hospital visits: None in previous 6 months   Objective:  Lab Results  Component Value Date   CREATININE 1.37 (H) 08/24/2021   BUN 14 08/24/2021   GFR 57.85 (L) 02/21/2018   EGFR 52 (L) 08/24/2021   GFRNONAA 45 (L) 07/03/2021   GFRAA 62 08/15/2019   NA 140 08/24/2021   K 4.6 08/24/2021   CALCIUM 9.4 08/24/2021   CO2 27 08/24/2021   GLUCOSE 103 (H) 08/24/2021    Lab Results  Component Value Date/Time   HGBA1C 5.4 09/24/2020 03:12 PM   HGBA1C 5.6 06/09/2019 05:27 AM   HGBA1C 5.3 10/11/2018 12:00 AM   GFR 57.85 (L) 02/21/2018 11:56 AM   GFR 61.28 04/19/2017 03:50 PM    Last diabetic Eye exam: No results found for: "HMDIABEYEEXA"  Last diabetic Foot exam: No results  found for: "HMDIABFOOTEX"   Lab Results  Component Value Date   CHOL 156 08/15/2019   HDL 46 08/15/2019   LDLCALC 85 08/15/2019   TRIG 144 08/15/2019   CHOLHDL 4.4 06/09/2019       Latest Ref Rng & Units 08/24/2021    2:16 PM 07/15/2021    3:16 PM 06/21/2021    8:35 PM  Hepatic Function  Total Protein 6.0 - 8.5 g/dL 6.9  6.4  7.6   Albumin 3.7 - 4.7 g/dL 4.0  3.7  3.9   AST 0 - 40 IU/L '22  18  26   '$ ALT 0 - 44 IU/L '12  12  13   '$ Alk Phosphatase 44 - 121 IU/L 71  80  79   Total Bilirubin 0.0 - 1.2 mg/dL 0.3  0.3  0.7     Lab Results  Component Value Date/Time   TSH 3.077 06/28/2021 04:26 AM   TSH 5.605 (H) 10/20/2018 09:46 PM   TSH 1.88 01/20/2015 11:22 AM   FREET4 0.66 10/21/2018 01:29 PM       Latest Ref Rng & Units 08/24/2021    2:16 PM 07/15/2021    3:16 PM 07/03/2021    6:14 AM  CBC  WBC 3.4 - 10.8 x10E3/uL 7.5  9.0  10.4   Hemoglobin 13.0 - 17.7 g/dL 11.0  10.7  10.5   Hematocrit 37.5 - 51.0 % 34.1  33.1  33.4   Platelets 150 - 450 x10E3/uL 250  310  377  Lab Results  Component Value Date/Time   VITAMINB12 444 09/24/2020 03:12 PM   [id number] 03/22/2019 12:00 AM    Clinical ASCVD: Yes  The ASCVD Risk score (Arnett DK, et al., 2019) failed to calculate for the following reasons:   The 2019 ASCVD risk score is only valid for ages 24 to 44   The patient has a prior MI or stroke diagnosis       05/25/2022    2:53 PM 04/25/2022    3:50 PM 04/14/2022    4:15 PM  Depression screen PHQ 2/9  Decreased Interest 1 1 0  Down, Depressed, Hopeless 0 0 1  PHQ - 2 Score '1 1 1  '$ Altered sleeping 1 3   Tired, decreased energy 1 3   Change in appetite 0 2   Feeling bad or failure about yourself  0 2   Trouble concentrating 0 1   Moving slowly or fidgety/restless 0 0   Suicidal thoughts 0 0   PHQ-9 Score 3 12   Difficult doing work/chores Not difficult at all Not difficult at all      Social History   Tobacco Use  Smoking Status Former   Packs/day: 1.00    Years: 27.00   Total pack years: 27.00   Types: Cigarettes   Quit date: 04/18/1988   Years since quitting: 34.2  Smokeless Tobacco Former   Types: Chew   Quit date: 12/20/2021   BP Readings from Last 3 Encounters:  05/04/22 130/80  04/25/22 134/73  04/14/22 (!) 92/51   Pulse Readings from Last 3 Encounters:  05/04/22 66  04/25/22 62  04/14/22 65   Wt Readings from Last 3 Encounters:  05/25/22 211 lb (95.7 kg)  05/04/22 218 lb (98.9 kg)  04/25/22 217 lb (98.4 kg)   BMI Readings from Last 3 Encounters:  05/25/22 31.16 kg/m  05/04/22 33.15 kg/m  04/25/22 32.99 kg/m    Allergies  Allergen Reactions   Formaldehyde Rash    From shoes made with this material    Medications Reviewed Today     Reviewed by Roger Shelter, LPN (Licensed Practical Nurse) on 05/25/22 at [place] List Status: <None>   Medication Order Taking? Sig Documenting Provider Last Dose Status Informant  amLODipine (NORVASC) 5 MG tablet [id number] Yes TAKE 1 TABLET BY MOUTH EVERY DAY Birdie Sons, MD Taking Active   aspirin 81 MG chewable tablet [id number]  Chew 1 tablet (81 mg total) by mouth 2 (two) times daily. Carlynn Spry, PA-C  Active   atorvastatin (LIPITOR) 40 MG tablet [id number] Yes Take 1 tablet (40 mg total) by mouth daily. Gwyneth Sprout, FNP Taking Active   docusate sodium (COLACE) 100 MG capsule [id number] Yes Take 1 capsule (100 mg total) by mouth 2 (two) times daily.  Patient taking differently: Take 200 mg by mouth daily.   Carlynn Spry, PA-C Taking Active            Med Note [facility], BRANDY L   Wed May 04, 2022  4:02 PM)    fluticasone (FLONASE) 50 MCG/ACT nasal spray [id number]  PLACE 1 SPRAY INTO BOTH NOSTRILS DAILY AS NEEDED FOR ALLERGIES OR RHINITIS. [name], MD  Active   hydrocortisone 2.5 % cream [id number]  APPLY TO AFFECTED AREA TWICE A DAY AS NEEDED FOR RASH Myles Gip, DO  Active   lisinopril-hydrochlorothiazide (ZESTORETIC) 10-12.5 MG tablet  [id number] Yes Take 1 tablet by mouth daily. Birdie Sons, MD Taking Active  Med Note Minerva Ends, FELECIA N   Thu Apr 14, 2022  4:07 PM) Reports dose was changed. Currently taking 25 mg daily  metoprolol tartrate (LOPRESSOR) 100 MG tablet [id number] Yes TAKE 1 TABLET BY MOUTH TWICE A DAY Gwyneth Sprout, FNP Taking Active   Multiple Vitamin (MULTIVITAMIN WITH MINERALS) TABS tablet [id number]  Take 1 tablet by mouth daily. Vaughan Basta, MD  Active Self  nicotine polacrilex ([employer] NICOTINE) 2 MG lozenge [id number]  Take 1 lozenge (2 mg total) by mouth as needed for smoking cessation. [name], MD  Active   omeprazole (PRILOSEC) 20 MG capsule [id number] Yes Take 20 mg by mouth. Reports currently taking as needed [provider] Taking Active Self  sertraline (ZOLOFT) 50 MG tablet [id number] Yes TAKE 1 TABLET BY MOUTH EVERY DAY Birdie Sons, MD Taking Active   sildenafil (REVATIO) 20 MG tablet [id number] No Take 1 tablet (20 mg total) by mouth daily.  Patient not taking: Reported on 05/25/2022   [name], MD Not Taking Active   vitamin B-12 (CYANOCOBALAMIN) 1000 MCG tablet [id number]  Take 1,000 mcg by mouth daily. [provider]  Active Self            SDOH:  (Social Determinants of Health) assessments and interventions performed: Yes SDOH Interventions    Flowsheet Row Clinical Support from 05/25/2022 in [place] Most recent reading at 05/25/2022  2:56 PM Office Visit from 04/25/2022 in [place] Most recent reading at 04/25/2022  4:00 PM Chronic Care Management from 04/14/2022 in [place] Most recent reading at 04/14/2022  4:15 PM Chronic Care Management from 01/21/2022 in [place] Most recent reading at 01/24/2022 10:42 AM Video Visit from 07/06/2021 in [place] Most recent reading at 07/06/2021  1:10  PM Chronic Care Management from 07/06/2021 in [place] Most recent reading at 07/06/2021 11:48 AM  SDOH Interventions        Food Insecurity Interventions -- -- Intervention Not Indicated -- -- --  Housing Interventions Intervention Not Indicated -- Intervention Not Indicated -- -- --  Transportation Interventions Intervention Not Indicated -- Intervention Not Indicated -- -- --  Utilities Interventions Intervention Not Indicated -- Intervention Not Indicated -- -- --  Alcohol Usage Interventions Intervention Not Indicated (Score <7) -- -- -- -- --  Depression Interventions/Treatment  Medication Counseling -- -- Medication --  Financial Strain Interventions Intervention Not Indicated -- Intervention Not Indicated Intervention Not Indicated -- Intervention Not Indicated  Physical Activity Interventions Intervention Not Indicated, Other (Comments)  [pt says PT sessions after hip replacement last year complete but his legs still no strength in them sometimes] -- -- -- -- --  Stress Interventions Intervention Not Indicated -- Intervention Not Indicated -- -- --  Social Connections Interventions Intervention Not Indicated -- Intervention Not Indicated -- -- --       Medication Assistance: None required.  Patient affirms current coverage meets needs.  Medication Access: Within the past 30 days, how often has patient missed a dose of medication? None Is a pillbox or other method used to improve adherence? Yes  Factors that may affect medication adherence? no barriers identified Are meds synced by current pharmacy? No  Are meds delivered by current pharmacy? No  Does patient experience delays in picking up medications due to transportation concerns? No   [facility] Services Reviewed: Is patient disadvantaged to use [facility]?: Yes  Current Rx insurance plan: [employer] Name and location of Current pharmacy:  [place] #[id number][address] [address]Phone: [phone]: [phone] [facility] services reviewed with patient today?: No  Patient requests to transfer care to [facility]?: No  Reason patient declined to change pharmacies: Disadvantaged due to insurance/mail order  Compliance/Adherence/Medication fill history: Care Gaps: Covid Booster Shingrix  Star-Rating Drugs: Atorvastatin 40 mg: Last filled 03/15/22 for 90-DS  Lisinopril-HCTZ 10-12.5: Last filled 04/04/22 for 90-DS    Assessment/Plan  Hypertension (BP goal <140/90) -Controlled -Current treatment: Metoprolol tartrate 100 mg twice daily: Appropriate, Effective, Safe, Accessible  Lisinopril-HCTZ 10-12.5 mg daily: Appropriate, Effective, Safe, Accessible  -Medications previously tried: Atenolol, Bisoprolol, nebivolol, amlodipine, lisinopril  -Current home readings: 130/85 -Current dietary habits: Eats what I want to eat. 1 cup of coffee. Ocassional sweet tea or regular soda. Eats out large meal once weekly. Does salt food.  -Current exercise habits: None Still feels some unsteadiness in legs, but dizziness has improved overall.  -Recommended to continue current medication  Atrial Fibrillation (Goal: prevent stroke and major bleeding) -Controlled -CHADSVASC: 7  -Current treatment: Rate control:  Metoprolol 100 mg twice daily  Anticoagulation: Aspirin 81 mg twice daily  -Medications previously tried: NA -Recommended to continue current medication  Hyperlipidemia: (LDL goal < 70) -Uncontrolled -History of TIA, CVA  -Current treatment: Atorvastatin 40 mg daily (AM) -Current treatment: Aspirin 81 mg twice daily (AM) -Medications previously tried: NA  -Recommended to continue current medication  Depression (Goal: Maintain symptom remission) -Controlled -Current treatment: Sertraline 50 mg daily  -Medications previously tried/failed: NA -PHQ9: 3 -Recommended to continue current  medication  GERD (Goal: Prevent Reflux) -Controlled History of stomach ulcer  -Current treatment  Pantoprazole 40 mg twice daily: Query Appropriate  -Medications previously tried: NA  -Query appropriate use of twice daily pantoprazole, defer potential changes for now.  -Recommended to continue current medication  Chronic Kidney Disease Stage 3a  -All medications assessed for renal dosing and appropriateness in chronic kidney disease. -Recommended to continue current medication  Tobacco use (Goal Quit smoking) -On track  -Previous quit attempts: Nicotine Patch (Ineffective)  -Current treatment  Nicotine 2 mg lozenge as needed  -Patient smokes After 30 minutes of waking -Patient triggers include: finishing a meal. -Patient is in an action stage of smoking cessation. He quit using tobacco products on 12/20/21.  -Continues to utilize nicotine lozenges ~3x daily. Reports doing well his avoidance of tobacco products. Discussed working on utilizing alternative strategies to manage post-meal cravings (going for a walk, stepping outside, chewing gum) rather than always relying on nicotine lozenges.  -Continue current medications   AJunius Argyle PharmD, BPara March CBear Valley SpringsPharmacist Practitioner  [phone]

## 2022-07-12 ENCOUNTER — Telehealth: Payer: Self-pay

## 2022-07-20 ENCOUNTER — Other Ambulatory Visit: Payer: Self-pay | Admitting: Family Medicine

## 2022-07-20 DIAGNOSIS — I10 Essential (primary) hypertension: Secondary | ICD-10-CM

## 2022-07-20 DIAGNOSIS — I110 Hypertensive heart disease with heart failure: Secondary | ICD-10-CM

## 2022-07-20 DIAGNOSIS — F320 Major depressive disorder, single episode, mild: Secondary | ICD-10-CM

## 2022-07-20 NOTE — Telephone Encounter (Signed)
Requested Prescriptions  Pending Prescriptions Disp Refills   sertraline (ZOLOFT) 50 MG tablet [Pharmacy Med Name: SERTRALINE HCL 50 MG TABLET] 90 tablet 0    Sig: TAKE 1 TABLET BY MOUTH EVERY DAY     Psychiatry:  Antidepressants - SSRI - sertraline Passed - 07/20/2022  2:25 AM      Passed - AST in normal range and within 360 days    AST  Date Value Ref Range Status  08/24/2021 22 0 - 40 IU/L Final         Passed - ALT in normal range and within 360 days    ALT  Date Value Ref Range Status  08/24/2021 12 0 - 44 IU/L Final         Passed - Completed PHQ-2 or PHQ-9 in the last 360 days      Passed - Valid encounter within last 6 months    Recent Outpatient Visits           2 months ago Bilateral leg weakness   [place], Dionne Bucy, MD   6 months ago Ataxia, late effect of cerebrovascular disease   [place], Donald E, MD   9 months ago Essential hypertension   [place], [place], PA-C   1 year ago Essential hypertension   [place], Dionne Bucy, MD   1 year ago Depression, major, single episode, mild [facility])   [place], Dani Gobble, PA-C       Future Appointments             In 2 months [name], MD [place]

## 2022-07-20 NOTE — Telephone Encounter (Signed)
Requested Prescriptions  Pending Prescriptions Disp Refills   metoprolol tartrate (LOPRESSOR) 100 MG tablet [Pharmacy Med Name: METOPROLOL TARTRATE 100 MG TAB] 180 tablet 0    Sig: TAKE 1 TABLET BY MOUTH TWICE A DAY     Cardiovascular:  Beta Blockers Passed - 07/20/2022  2:27 AM      Passed - Last BP in normal range    BP Readings from Last 1 Encounters:  05/04/22 130/80         Passed - Last Heart Rate in normal range    Pulse Readings from Last 1 Encounters:  05/04/22 66         Passed - Valid encounter within last 6 months    Recent Outpatient Visits           2 months ago Bilateral leg weakness   [place], Dionne Bucy, MD   6 months ago Ataxia, late effect of cerebrovascular disease   [place], Donald E, MD   9 months ago Essential hypertension   [place], [place], PA-C   1 year ago Essential hypertension   [place], Dionne Bucy, MD   1 year ago Depression, major, single episode, mild [facility])   [place], PA-C       Future Appointments             In 2 months [name], MD [place]

## 2022-07-28 ENCOUNTER — Ambulatory Visit: Payer: Medicare Other | Admitting: Family Medicine

## 2022-08-11 ENCOUNTER — Encounter: Payer: Self-pay | Admitting: Family Medicine

## 2022-08-11 ENCOUNTER — Ambulatory Visit (INDEPENDENT_AMBULATORY_CARE_PROVIDER_SITE_OTHER): Payer: No Typology Code available for payment source | Admitting: Family Medicine

## 2022-08-11 VITALS — BP 118/76 | HR 66 | Temp 97.8°F | Resp 16 | Wt 215.8 lb

## 2022-08-11 DIAGNOSIS — M1711 Unilateral primary osteoarthritis, right knee: Secondary | ICD-10-CM | POA: Diagnosis not present

## 2022-08-11 DIAGNOSIS — F4321 Adjustment disorder with depressed mood: Secondary | ICD-10-CM | POA: Diagnosis not present

## 2022-08-11 DIAGNOSIS — Z789 Other specified health status: Secondary | ICD-10-CM | POA: Diagnosis not present

## 2022-08-11 DIAGNOSIS — I10 Essential (primary) hypertension: Secondary | ICD-10-CM

## 2022-08-11 DIAGNOSIS — M1611 Unilateral primary osteoarthritis, right hip: Secondary | ICD-10-CM | POA: Diagnosis not present

## 2022-08-11 MED ORDER — OMEPRAZOLE 20 MG PO CPDR: 20.0000 mg | DELAYED_RELEASE_CAPSULE | Freq: Every day | ORAL | 1 refills | Status: DC

## 2022-08-11 NOTE — Assessment & Plan Note (Signed)
Chronic and well-controlled Continue current medications 

## 2022-08-11 NOTE — Progress Notes (Signed)
Established Patient Office Visit  Subjective   Patient ID: Cameron Foster., male    DOB: 30-Dec-1940  Age: 82 y.o. MRN: [id number]  Chief Complaint  Patient presents with   Hypertension    HPI Patient presents to clinic to day for follow up of chronic conditions.  Chronic conditions He feels well physically and has no difficulty obtaining or remembering to take his medications. He needs a refill of his omeprazole.    Loss of loved one Wife passed 2 weeks ago from cancer.  Feels "grief stricken"  Sister lives in [place] and visits him every other day.  With the loss of his wife he would like to get home help with cooking, getting dressed, etc.  Is trying to get long term care [facility], but needed a doctor's visit.     Review of Systems  Constitutional:  Negative for chills and fever.  Respiratory:  Positive for cough. Negative for shortness of breath.   Cardiovascular:  Positive for leg swelling. Negative for chest pain.  Gastrointestinal:  Negative for abdominal pain.      Objective:     BP 118/76 (BP Location: Left Arm, Patient Position: Sitting, Cuff Size: Large)   Pulse 66   Temp 97.8 F (36.6 C) (Temporal)   Resp 16   Wt 215 lb 12.8 oz (97.9 kg)   SpO2 96%   BMI 31.87 kg/m    Physical Exam Constitutional:      General: He is not in acute distress. Cardiovascular:     Pulses: Normal pulses.     Heart sounds: Normal heart sounds.  Pulmonary:     Effort: Pulmonary effort is normal.     Breath sounds: Normal breath sounds.  Abdominal:     General: Bowel sounds are normal.     Palpations: Abdomen is soft.  Musculoskeletal:     Right lower leg: Edema present.     Left lower leg: Edema present.  Skin:    General: Skin is warm and dry.     Capillary Refill: Capillary refill takes less than 2 seconds.  Neurological:     Mental Status: He is alert.     Motor: Weakness present.     Comments: Antalgic gait   Psychiatric:        Attention  and Perception: Attention and perception normal.        Mood and Affect: Mood is not depressed. Affect is tearful.        Speech: Speech normal.        Behavior: Behavior normal.        Thought Content: Thought content normal.      No results found for any visits on 08/11/22.    The ASCVD Risk score (Arnett DK, et al., 2019) failed to calculate for the following reasons:   The 2019 ASCVD risk score is only valid for ages 41 to 1   The patient has a prior MI or stroke diagnosis    Assessment & Plan:   Problem List Items Addressed This Visit     Essential hypertension - Primary    Chronic and well controlled. Continue current medications.       Osteoarthritis of right knee    Chronic and limits daily functioning, such as getting dressed and cooking.   Difficulty crossing right leg over left and bending right knee.  Uses a cane due to limited mobility.  Recommended personal care services.       Osteoarthritis of  hip    See plan under osteoarthritis of right knee.      Other Visit Diagnoses     Deficit in activities of daily living (ADL)       Grief         Precipitated by recent loss of his wife.  Difficulty with cooking, dressing himself, and other activities of daily living due to physical limitations (see plan under OA of right knee).  Patient says that [facility] will reach out with required paperwork for personal care services.  Patient given the phone number for hospice grief counseling services.   Medication Refill Omeprazole refilled at this visit.   Return in about 4 weeks (around 09/08/2022) for CPE.    Gilmer Mor, Medical Student   Patient seen along with MS3 student Ezekiel Slocumb. I personally evaluated this patient along with the student, and verified all aspects of the history, physical exam, and medical decision making as documented by the student. I agree with the student's documentation and have made all necessary edits.  Zakari Bathe, Marzella Schlein, MD, MPH [facility]

## 2022-08-11 NOTE — Assessment & Plan Note (Signed)
See plan under osteoarthritis of right knee.

## 2022-08-11 NOTE — Assessment & Plan Note (Addendum)
Chronic and limits daily functioning, such as getting dressed and cooking.   Difficulty crossing right leg over left and bending right knee.  Uses a cane due to limited mobility.  Recommended personal care services.

## 2022-08-17 ENCOUNTER — Other Ambulatory Visit: Payer: Self-pay | Admitting: Internal Medicine

## 2022-08-17 ENCOUNTER — Other Ambulatory Visit: Payer: Self-pay | Admitting: Family Medicine

## 2022-08-17 DIAGNOSIS — I10 Essential (primary) hypertension: Secondary | ICD-10-CM

## 2022-08-18 ENCOUNTER — Telehealth: Payer: Self-pay | Admitting: Family Medicine

## 2022-08-18 DIAGNOSIS — Z0279 Encounter for issue of other medical certificate: Secondary | ICD-10-CM

## 2022-08-18 NOTE — Telephone Encounter (Signed)
  [facility] paperwork was received on 08/18/2022 & placed in provider's mailbox. Please allow 7-10 business days to be completed. Thank you

## 2022-08-18 NOTE — Telephone Encounter (Signed)
Requested Prescriptions  Pending Prescriptions Disp Refills   amLODipine (NORVASC) 5 MG tablet [Pharmacy Med Name: AMLODIPINE BESYLATE 5 MG TAB] 90 tablet 1    Sig: TAKE 1 TABLET BY MOUTH EVERY DAY     Cardiovascular: Calcium Channel Blockers 2 Passed - 08/17/2022 10:08 AM      Passed - Last BP in normal range    BP Readings from Last 1 Encounters:  08/11/22 118/76         Passed - Last Heart Rate in normal range    Pulse Readings from Last 1 Encounters:  08/11/22 66         Passed - Valid encounter within last 6 months    Recent Outpatient Visits           1 week ago Essential hypertension   [facility] [place], Marzella Schlein, MD   3 months ago Bilateral leg weakness   [place] [facility] [place], Marzella Schlein, MD   7 months ago Ataxia, late effect of cerebrovascular disease   [place] [facility] Malva Limes, MD   10 months ago Essential hypertension   [place] [place], [place], PA-C   1 year ago Essential hypertension   [place] [facility] [place], Marzella Schlein, MD       Future Appointments             In 3 weeks Bacigalupo, Marzella Schlein, MD [facility]   In 1 month Willeen Niece, MD [facility]

## 2022-08-31 ENCOUNTER — Encounter: Payer: Self-pay | Admitting: Family Medicine

## 2022-08-31 ENCOUNTER — Telehealth: Payer: Self-pay

## 2022-08-31 DIAGNOSIS — I10 Essential (primary) hypertension: Secondary | ICD-10-CM

## 2022-08-31 DIAGNOSIS — M1611 Unilateral primary osteoarthritis, right hip: Secondary | ICD-10-CM

## 2022-08-31 DIAGNOSIS — F4321 Adjustment disorder with depressed mood: Secondary | ICD-10-CM

## 2022-08-31 DIAGNOSIS — M1711 Unilateral primary osteoarthritis, right knee: Secondary | ICD-10-CM

## 2022-08-31 DIAGNOSIS — Z789 Other specified health status: Secondary | ICD-10-CM

## 2022-08-31 NOTE — Telephone Encounter (Signed)
Copied from CRM [id number]. Topic: General - Other >> Aug 31, 2022 11:15 AM Patsy Lager T wrote: Reason for CRM: Patient called stated he was returning the nurses call. He said it was about home health and he was confused. Please contact patient to advise

## 2022-09-07 ENCOUNTER — Other Ambulatory Visit: Payer: Self-pay | Admitting: Internal Medicine

## 2022-09-07 ENCOUNTER — Other Ambulatory Visit: Payer: Self-pay | Admitting: Family Medicine

## 2022-09-07 ENCOUNTER — Telehealth: Payer: Self-pay

## 2022-09-07 DIAGNOSIS — F320 Major depressive disorder, single episode, mild: Secondary | ICD-10-CM

## 2022-09-07 NOTE — Telephone Encounter (Signed)
Requested Prescriptions  Pending Prescriptions Disp Refills   fluticasone (FLONASE) 50 MCG/ACT nasal spray [Pharmacy Med Name: FLUTICASONE PROP 50 MCG SPRAY] 48 mL 3    Sig: PLACE 1 SPRAY INTO BOTH NOSTRILS DAILY AS NEEDED FOR ALLERGIES OR RHINITIS.     Ear, Nose, and Throat: Nasal Preparations - Corticosteroids Passed - 09/07/2022 12:10 PM      Passed - Valid encounter within last 12 months    Recent Outpatient Visits           3 weeks ago Essential hypertension   [place] [facility] [place], Marzella Schlein, MD   4 months ago Bilateral leg weakness   [place] [facility] [place], Marzella Schlein, MD   8 months ago Ataxia, late effect of cerebrovascular disease   [place] [facility] Malva Limes, MD   10 months ago Essential hypertension   [place] [facility] [place], [place], PA-C   1 year ago Essential hypertension   [place] [facility] Bacigalupo, Marzella Schlein, MD       Future Appointments             Tomorrow Bacigalupo, Marzella Schlein, MD [facility]   In 1 month Willeen Niece, MD [facility]             sertraline (ZOLOFT) 25 MG tablet [Pharmacy Med Name: SERTRALINE HCL 25 MG TABLET] 30 tablet 1    Sig: TAKE 1 TABLET (25 MG TOTAL) BY MOUTH DAILY.     Psychiatry:  Antidepressants - SSRI - sertraline Failed - 09/07/2022 12:10 PM      Failed - AST in normal range and within 360 days    AST  Date Value Ref Range Status  08/24/2021 22 0 - 40 IU/L Final         Failed - ALT in normal range and within 360 days    ALT  Date Value Ref Range Status  08/24/2021 12 0 - 44 IU/L Final         Passed - Completed PHQ-2 or PHQ-9 in the last 360 days      Passed - Valid encounter within last 6 months    Recent Outpatient Visits           3 weeks ago Essential hypertension   [place] [place], Marzella Schlein, MD   4  months ago Bilateral leg weakness   [place] [facility] [place], Marzella Schlein, MD   8 months ago Ataxia, late effect of cerebrovascular disease   [place] [facility] Malva Limes, MD   10 months ago Essential hypertension   [place] [facility] [place], [place], PA-C   1 year ago Essential hypertension   [place] [facility] [place], Marzella Schlein, MD       Future Appointments             Tomorrow Bacigalupo, Marzella Schlein, MD [facility]   In 1 month Willeen Niece, MD [place] [facility]

## 2022-09-07 NOTE — Telephone Encounter (Signed)
Copied from CRM [id number]. Topic: General - Other >> Sep 07, 2022  1:33 PM Yolanda T wrote: Reason for CRM: Alyssa from [facility] called stated the service that was ordered they do not provide. Please f/u with patient

## 2022-09-08 ENCOUNTER — Encounter: Payer: Self-pay | Admitting: Family Medicine

## 2022-09-08 ENCOUNTER — Ambulatory Visit (INDEPENDENT_AMBULATORY_CARE_PROVIDER_SITE_OTHER): Payer: No Typology Code available for payment source | Admitting: Family Medicine

## 2022-09-08 VITALS — BP 139/60 | HR 66 | Temp 98.2°F | Resp 12 | Wt 217.1 lb

## 2022-09-08 DIAGNOSIS — Z6832 Body mass index (BMI) 32.0-32.9, adult: Secondary | ICD-10-CM | POA: Diagnosis not present

## 2022-09-08 DIAGNOSIS — E538 Deficiency of other specified B group vitamins: Secondary | ICD-10-CM

## 2022-09-08 DIAGNOSIS — Z8546 Personal history of malignant neoplasm of prostate: Secondary | ICD-10-CM

## 2022-09-08 DIAGNOSIS — G629 Polyneuropathy, unspecified: Secondary | ICD-10-CM

## 2022-09-08 DIAGNOSIS — E669 Obesity, unspecified: Secondary | ICD-10-CM | POA: Diagnosis not present

## 2022-09-08 DIAGNOSIS — R739 Hyperglycemia, unspecified: Secondary | ICD-10-CM | POA: Diagnosis not present

## 2022-09-08 DIAGNOSIS — N1831 Chronic kidney disease, stage 3a: Secondary | ICD-10-CM

## 2022-09-08 DIAGNOSIS — D649 Anemia, unspecified: Secondary | ICD-10-CM | POA: Diagnosis not present

## 2022-09-08 DIAGNOSIS — Z0000 Encounter for general adult medical examination without abnormal findings: Secondary | ICD-10-CM

## 2022-09-08 DIAGNOSIS — F320 Major depressive disorder, single episode, mild: Secondary | ICD-10-CM

## 2022-09-08 DIAGNOSIS — M48061 Spinal stenosis, lumbar region without neurogenic claudication: Secondary | ICD-10-CM

## 2022-09-08 DIAGNOSIS — E785 Hyperlipidemia, unspecified: Secondary | ICD-10-CM

## 2022-09-08 DIAGNOSIS — I10 Essential (primary) hypertension: Secondary | ICD-10-CM

## 2022-09-08 DIAGNOSIS — F321 Major depressive disorder, single episode, moderate: Secondary | ICD-10-CM

## 2022-09-08 DIAGNOSIS — F4321 Adjustment disorder with depressed mood: Secondary | ICD-10-CM

## 2022-09-08 MED ORDER — GABAPENTIN 300 MG PO CAPS: 300.0000 mg | ORAL_CAPSULE | Freq: Three times a day (TID) | ORAL | 3 refills | Status: DC

## 2022-09-08 MED ORDER — SILDENAFIL CITRATE 20 MG PO TABS: 20.0000 mg | ORAL_TABLET | Freq: Every day | ORAL | 2 refills | Status: DC

## 2022-09-08 MED ORDER — SERTRALINE HCL 100 MG PO TABS: 100.0000 mg | ORAL_TABLET | Freq: Every day | ORAL | 1 refills | Status: DC

## 2022-09-08 NOTE — Telephone Encounter (Signed)
Can we send to another home health company or will we have to forgo the aide and go ahead with other services?

## 2022-09-08 NOTE — Assessment & Plan Note (Signed)
Asymptomatic. No longer seeing urology. Ordered PSA.

## 2022-09-08 NOTE — Assessment & Plan Note (Signed)
Discussed importance of healthy diet and exercise.  

## 2022-09-08 NOTE — Assessment & Plan Note (Signed)
Related to wife's death 2 months ago. Attends grief counseling. Receives support from his church and sister.

## 2022-09-08 NOTE — Assessment & Plan Note (Addendum)
Reviewed previous CMP. Chronic and stable. Avoid nephrotoxic medications. Ordered CMP.

## 2022-09-08 NOTE — Telephone Encounter (Signed)
What service do they not offer?  He needs HH.  We can send to another company if needed

## 2022-09-08 NOTE — Telephone Encounter (Signed)
Home health aide

## 2022-09-08 NOTE — Progress Notes (Signed)
Complete physical exam  Patient: Cameron Foster.   DOB: 04-Jun-1940   82 y.o. Male  MRN: [id number]  Subjective:    Chief Complaint  Patient presents with   Annual Exam    Cameron Foster. is a 82 y.o. male who presents today for a complete physical exam. He reports consuming a general diet. The patient does not participate in regular exercise at present. He generally feels poorly. He reports sleeping poorly. He does have additional problems to discuss today.   Grief Wife died 2 months ago.  Has been attending grief counseling. Feels that he would benefit from starting a medication.  Home health has not come out to his home yet.   Toe pain Has noticed sharp pain in his toes bilaterally in the past month. Pain is greatest in his right 1st toe, that also has a burning feeling. Impacts balance and ADLs.  Walks with cane currently.     Most recent fall risk assessment:    08/11/2022    3:53 PM  Fall Risk   Falls in the past year? 1  Number falls in past yr: 0  Injury with Fall? 0  Risk for fall due to : History of fall(s)  Follow up Falls evaluation completed     Most recent depression screenings:    08/11/2022    3:53 PM 05/25/2022    2:53 PM  PHQ 2/9 Scores  PHQ - 2 Score 6 1  PHQ- 9 Score 12 3        Patient Care Team: Erasmo Downer, MD as PCP - General (Family Medicine) End, Cristal Deer, MD as PCP - Cardiology (Cardiology) End, Cristal Deer, MD as Consulting Physician (Cardiology) Deeann Saint, MD (Orthopedic Surgery) Schnier, Latina Craver, MD (Vascular Surgery) Willeen Niece, MD (Dermatology) Elliot Cousin, MD (Inactive) as Consulting Physician (Ophthalmology) Gaspar Cola, [facility] (Pharmacist) Juanell Fairly, RN as Case Manager   Outpatient Medications Prior to Visit  Medication Sig   amLODipine (NORVASC) 5 MG tablet TAKE 1 TABLET BY MOUTH EVERY DAY   aspirin 81 MG chewable tablet Chew 1 tablet (81 mg total) by mouth 2 (two) times daily.    atorvastatin (LIPITOR) 40 MG tablet Take 1 tablet (40 mg total) by mouth daily.   docusate sodium (COLACE) 100 MG capsule Take 1 capsule (100 mg total) by mouth 2 (two) times daily. (Patient taking differently: Take 200 mg by mouth daily.)   fluticasone (FLONASE) 50 MCG/ACT nasal spray PLACE 1 SPRAY INTO BOTH NOSTRILS DAILY AS NEEDED FOR ALLERGIES OR RHINITIS.   hydrocortisone 2.5 % cream APPLY TO AFFECTED AREA TWICE A DAY AS NEEDED FOR RASH   ibuprofen (ADVIL) 200 MG tablet Take 400 mg by mouth every 6 (six) hours as needed.   lisinopril-hydrochlorothiazide (ZESTORETIC) 10-12.5 MG tablet Take 1 tablet by mouth daily.   metoprolol tartrate (LOPRESSOR) 100 MG tablet TAKE 1 TABLET BY MOUTH TWICE A DAY   Multiple Vitamin (MULTIVITAMIN WITH MINERALS) TABS tablet Take 1 tablet by mouth daily.   nicotine polacrilex ([employer] NICOTINE) 2 MG lozenge Take 1 lozenge (2 mg total) by mouth as needed for smoking cessation.   omeprazole (PRILOSEC) 20 MG capsule Take 1 capsule (20 mg total) by mouth daily. Reports currently taking as needed   vitamin B-12 (CYANOCOBALAMIN) 1000 MCG tablet Take 1,000 mcg by mouth daily.   [DISCONTINUED] sertraline (ZOLOFT) 50 MG tablet TAKE 1 TABLET BY MOUTH EVERY DAY   [DISCONTINUED] sildenafil (REVATIO) 20 MG tablet Take 1 tablet (  20 mg total) by mouth daily.   No facility-administered medications prior to visit.    Review of Systems  Constitutional:  Negative for chills and fever.  Respiratory:  Negative for shortness of breath.   Cardiovascular:  Negative for chest pain.  Musculoskeletal:  Positive for back pain.  Neurological:  Negative for dizziness and headaches.          Objective:     BP 139/60 (BP Location: Right Arm, Patient Position: Sitting, Cuff Size: Normal)   Pulse 66   Temp 98.2 F (36.8 C) (Temporal)   Resp 12   Wt 217 lb 1.6 oz (98.5 kg)   SpO2 98%   BMI 32.06 kg/m    Physical Exam HENT:     Head: Normocephalic and atraumatic.  Eyes:      Extraocular Movements: Extraocular movements intact.     Conjunctiva/sclera: Conjunctivae normal.  Cardiovascular:     Pulses: Normal pulses.          Dorsalis pedis pulses are 2+ on the right side and 2+ on the left side.       Posterior tibial pulses are 2+ on the right side and 2+ on the left side.     Heart sounds: Normal heart sounds.  Pulmonary:     Effort: Pulmonary effort is normal.     Breath sounds: Normal breath sounds.  Musculoskeletal:     Right lower leg: Edema present.     Left lower leg: Edema present.     Right foot: No deformity, bunion or foot drop.     Left foot: No deformity, bunion or foot drop.  Feet:     Right foot:     Skin integrity: Dry skin present. No ulcer.     Toenail Condition: Right toenails are abnormally thick.     Left foot:     Skin integrity: Dry skin present. No ulcer.     Toenail Condition: Left toenails are abnormally thick.  Skin:    General: Skin is warm and dry.     Capillary Refill: Capillary refill takes less than 2 seconds.     Findings: No bruising.  Neurological:     Mental Status: He is alert.  Psychiatric:        Attention and Perception: Attention and perception normal.        Mood and Affect: Mood is depressed. Affect is tearful.        Speech: Speech normal.        Behavior: Behavior normal.        Thought Content: Thought content normal.        Cognition and Memory: Cognition and memory normal.        Judgment: Judgment normal.      No results found for any visits on 09/08/22.     Assessment & Plan:    Routine Health Maintenance and Physical Exam  Immunization History  Administered Date(s) Administered   COVID-19, mRNA, vaccine(Comirnaty)12 years and older 01/24/2022   Fluad Quad(high Dose 65+) 01/14/2020, 01/26/2021, 12/28/2021   Influenza, High Dose Seasonal PF 01/26/2017, 02/01/2018, 01/31/2019   Influenza,inj,Quad PF,6+ Mos 02/08/2016   Influenza,inj,quad, With Preservative 12/18/2014   PFIZER  Comirnaty(Gray Top)Covid-19 Tri-Sucrose Vaccine 08/24/2020   PFIZER(Purple Top)SARS-COV-2 Vaccination 05/31/2019, 06/25/2019, 01/22/2020   Pneumococcal Conjugate-13 05/14/2018   Pneumococcal Polysaccharide-23 12/18/2014   Pneumococcal-Unspecified 12/18/2014   Tdap 01/14/2020   Zoster Recombinat (Shingrix) 01/24/2022    Health Maintenance  Topic Date Due   COVID-19 Vaccine (6 - 2023-24  season) 03/21/2022   Zoster Vaccines- Shingrix (2 of 2) 03/21/2022   INFLUENZA VACCINE  11/17/2022   Medicare Annual Wellness (AWV)  05/26/2023   DTaP/Tdap/Td (2 - Td or Tdap) 01/13/2030   Pneumonia Vaccine 51+ Years old  Completed   HPV VACCINES  Aged Out   Colonoscopy  Discontinued    Discussed health benefits of physical activity, and encouraged him to engage in regular exercise appropriate for his age and condition.  Problem List Items Addressed This Visit     History of prostate cancer    Asymptomatic. No longer seeing urology. Ordered PSA.       Relevant Orders   PSA Total (Reflex To Free)   Grief    Related to wife's death 2 months ago. Attends grief counseling. Receives support from his church and sister.       Essential hypertension    Chronic, elevated at today's visit. Likely influenced by grief. Continue current medications. Ordered CMP and lipid panel.       Relevant Medications   sildenafil (REVATIO) 20 MG tablet   Other Relevant Orders   Comprehensive metabolic panel   Depression, major, single episode, mild (HCC)    Chronic and uncontrolled due to recent loss of wife. Attends grief counseling. Increase Sertraline to 100 mg.  Follow up in 6 weeks.       Relevant Medications   sertraline (ZOLOFT) 100 MG tablet   Obesity    Discussed importance of healthy diet and exercise.       Hyperlipidemia LDL goal <70    Reviewed previous lipid panel.  Continue statin.  Ordered CMP and lipid panel.       Relevant Medications   sildenafil (REVATIO) 20 MG tablet    Other Relevant Orders   Comprehensive metabolic panel   Lipid panel   Foraminal stenosis of lumbar region    Pain limits activities of daily living. Start Gabapentin 300 mg TID.       Stage 3a chronic kidney disease (HCC)    Reviewed previous CMP. Chronic and stable. Avoid nephrotoxic medications. Ordered CMP.       Relevant Orders   Comprehensive metabolic panel   Other Visit Diagnoses     Encounter for annual physical exam    -  Primary   Relevant Orders   PSA Total (Reflex To Free)   Hemoglobin A1c   Comprehensive metabolic panel   Lipid panel   CBC w/Diff/Platelet   B12   Hyperglycemia   Noted on previous labs.  Not currently managed with medication. Continue management with diet and exercise. Ordered Hgb A1c.    Relevant Orders   Hemoglobin A1c   Neuropathy     Impacting patient's ADLs.  Ordered B12. Start gabapentin 100 mg TID.    Relevant Orders   B12   B12 deficiency     Chronic and previously stable. Continue B12 supplements. Ordered B12.   Relevant Orders   B12   Anemia, unspecified type   Noted on previous labs.  Not currently managed with medication. Continue multivitamin.  Ordered CBC w/diff.   Relevant Orders   CBC w/Diff/Platelet      Return in about 6 weeks (around 10/20/2022) for medication follow-up.     Gilmer Mor, Medical Student   Patient seen along with MS3 student Ezekiel Slocumb. I personally evaluated this patient along with the student, and verified all aspects of the history, physical exam, and medical decision making as documented by the student. I agree  with the student's documentation and have made all necessary edits.  Crosley Stejskal, Marzella Schlein, MD, MPH [facility]

## 2022-09-08 NOTE — Assessment & Plan Note (Signed)
Chronic, elevated at today's visit. Likely influenced by grief. Continue current medications. Ordered CMP and lipid panel.

## 2022-09-08 NOTE — Assessment & Plan Note (Addendum)
Pain limits activities of daily living. Start Gabapentin 300 mg TID.

## 2022-09-08 NOTE — Assessment & Plan Note (Addendum)
Chronic and uncontrolled due to recent loss of wife. Attends grief counseling. Increase Sertraline to 100 mg.  Follow up in 6 weeks.

## 2022-09-08 NOTE — Assessment & Plan Note (Signed)
Reviewed previous lipid panel.  Continue statin.  Ordered CMP and lipid panel.

## 2022-09-09 ENCOUNTER — Telehealth: Payer: Self-pay | Admitting: Family Medicine

## 2022-09-09 LAB — COMPREHENSIVE METABOLIC PANEL
ALT: 11 IU/L (ref 0–44)
AST: 21 IU/L (ref 0–40)
Albumin/Globulin Ratio: 1.3 (ref 1.2–2.2)
Albumin: 3.9 g/dL (ref 3.7–4.7)
Alkaline Phosphatase: 74 IU/L (ref 44–121)
BUN/Creatinine Ratio: 11 (ref 10–24)
BUN: 15 mg/dL (ref 8–27)
Bilirubin Total: 0.4 mg/dL (ref 0.0–1.2)
CO2: 26 mmol/L (ref 20–29)
Calcium: 9.6 mg/dL (ref 8.6–10.2)
Chloride: 101 mmol/L (ref 96–106)
Creatinine, Ser: 1.33 mg/dL — ABNORMAL HIGH (ref 0.76–1.27)
Globulin, Total: 3 g/dL (ref 1.5–4.5)
Glucose: 98 mg/dL (ref 70–99)
Potassium: 4.1 mmol/L (ref 3.5–5.2)
Sodium: 138 mmol/L (ref 134–144)
Total Protein: 6.9 g/dL (ref 6.0–8.5)
eGFR: 53 mL/min/{1.73_m2} — ABNORMAL LOW (ref >59)

## 2022-09-09 LAB — CBC WITH DIFFERENTIAL/PLATELET
Basophils Absolute: 0 10*3/uL (ref 0.0–0.2)
Basos: 1 %
EOS (ABSOLUTE): 0.3 10*3/uL (ref 0.0–0.4)
Eos: 4 %
Hematocrit: 38.8 % (ref 37.5–51.0)
Hemoglobin: 12.2 g/dL — ABNORMAL LOW (ref 13.0–17.7)
Immature Grans (Abs): 0 10*3/uL (ref 0.0–0.1)
Immature Granulocytes: 0 %
Lymphocytes Absolute: 1.3 10*3/uL (ref 0.7–3.1)
Lymphs: 17 %
MCH: 28.5 pg (ref 26.6–33.0)
MCHC: 31.4 g/dL — ABNORMAL LOW (ref 31.5–35.7)
MCV: 91 fL (ref 79–97)
Monocytes Absolute: 0.9 10*3/uL (ref 0.1–0.9)
Monocytes: 11 %
Neutrophils Absolute: 5.1 10*3/uL (ref 1.4–7.0)
Neutrophils: 67 %
Platelets: 248 10*3/uL (ref 150–450)
RBC: 4.28 x10E6/uL (ref 4.14–5.80)
RDW: 13 % (ref 11.6–15.4)
WBC: 7.7 10*3/uL (ref 3.4–10.8)

## 2022-09-09 LAB — HEMOGLOBIN A1C
Est. average glucose Bld gHb Est-mCnc: 123 mg/dL
Hgb A1c MFr Bld: 5.9 % — ABNORMAL HIGH (ref 4.8–5.6)

## 2022-09-09 LAB — LIPID PANEL
Chol/HDL Ratio: 3 ratio (ref 0.0–5.0)
Cholesterol, Total: 145 mg/dL (ref 100–199)
HDL: 48 mg/dL (ref >39)
LDL Chol Calc (NIH): 69 mg/dL (ref 0–99)
Triglycerides: 168 mg/dL — ABNORMAL HIGH (ref 0–149)
VLDL Cholesterol Cal: 28 mg/dL (ref 5–40)

## 2022-09-09 LAB — PSA TOTAL (REFLEX TO FREE): Prostate Specific Ag, Serum: <0.1 ng/mL (ref 0.0–4.0)

## 2022-09-09 LAB — VITAMIN B12: Vitamin B-12: 784 pg/mL (ref 232–1245)

## 2022-09-09 NOTE — Telephone Encounter (Signed)
Covermymeds requesting prior authorization Key: [id number] Name: majdi moeder Sildenafil Cirtrate 20 MG tablets

## 2022-09-09 NOTE — Telephone Encounter (Signed)
PA not done. Pt to pay out-of-pocket.

## 2022-09-13 ENCOUNTER — Telehealth: Payer: Self-pay | Admitting: Family Medicine

## 2022-09-13 ENCOUNTER — Telehealth: Payer: Self-pay

## 2022-09-13 NOTE — Telephone Encounter (Signed)
[employer] long-term [employer] Benefits paperwork was completed & faxed on 09/13/2022.

## 2022-09-13 NOTE — Telephone Encounter (Signed)
Called [employer] to advise that we have forms. And we are waiting for [facility]. They advised that [facility] is not required. Dr. B has completed forms and will give to front desk to fax. Maye Hides advised that [facility] is no longer required so that she may close out referral.

## 2022-09-15 ENCOUNTER — Encounter: Payer: Self-pay | Admitting: Family Medicine

## 2022-09-15 NOTE — Telephone Encounter (Signed)
Pt called read letter dated may 23 that [employer] hasn't received forms back for his home health. He will check back with them again, but asked If this can be resent also.

## 2022-09-15 NOTE — Telephone Encounter (Signed)
This was completed on Tuesday 5/28. Can we please refax the completed version

## 2022-09-20 NOTE — Telephone Encounter (Signed)
[employer] confirmed that they received paperwork .

## 2022-09-25 ENCOUNTER — Encounter: Payer: Self-pay | Admitting: Family Medicine

## 2022-09-25 DIAGNOSIS — G629 Polyneuropathy, unspecified: Secondary | ICD-10-CM

## 2022-10-04 ENCOUNTER — Telehealth: Payer: Self-pay | Admitting: Family Medicine

## 2022-10-04 NOTE — Telephone Encounter (Signed)
Covermymeds requesting prior authorization Key: [id number] Name: Cameron Foster. Sildenafil Citrate 20mg  tablets

## 2022-10-04 NOTE — Telephone Encounter (Signed)
PA not done. Patient to pay out-of-pocket.  

## 2022-10-05 DIAGNOSIS — Z96651 Presence of right artificial knee joint: Secondary | ICD-10-CM | POA: Diagnosis not present

## 2022-10-05 DIAGNOSIS — M7062 Trochanteric bursitis, left hip: Secondary | ICD-10-CM | POA: Diagnosis not present

## 2022-10-07 ENCOUNTER — Telehealth: Payer: Self-pay | Admitting: Internal Medicine

## 2022-10-07 DIAGNOSIS — M5416 Radiculopathy, lumbar region: Secondary | ICD-10-CM | POA: Diagnosis not present

## 2022-10-07 NOTE — Telephone Encounter (Signed)
Ok to take

## 2022-10-07 NOTE — Telephone Encounter (Signed)
Spoke to patient and informed him that Megan E. Supple, RPH-CPP said that it is safe to take. Patient understood with read back.

## 2022-10-07 NOTE — Telephone Encounter (Signed)
Pt c/o medication issue:  1. Name of Medication: Medrol Dosepak   2. How are you currently taking this medication (dosage and times per day)?   3. Are you having a reaction (difficulty breathing--STAT)? No  4. What is your medication issue? Pt was being seen at another MD office he has that would like to prescribe him this medication but he'd like to know if it's ok. Pt is requesting a callback. Please advise

## 2022-10-11 ENCOUNTER — Encounter: Payer: Self-pay | Admitting: Dermatology

## 2022-10-11 ENCOUNTER — Ambulatory Visit: Payer: Medicare Other | Admitting: Dermatology

## 2022-10-12 ENCOUNTER — Encounter: Payer: Self-pay | Admitting: Dermatology

## 2022-10-12 ENCOUNTER — Ambulatory Visit (INDEPENDENT_AMBULATORY_CARE_PROVIDER_SITE_OTHER): Payer: No Typology Code available for payment source | Admitting: Dermatology

## 2022-10-12 DIAGNOSIS — L820 Inflamed seborrheic keratosis: Secondary | ICD-10-CM

## 2022-10-12 DIAGNOSIS — W908XXA Exposure to other nonionizing radiation, initial encounter: Secondary | ICD-10-CM

## 2022-10-12 DIAGNOSIS — C44219 Basal cell carcinoma of skin of left ear and external auricular canal: Secondary | ICD-10-CM | POA: Diagnosis not present

## 2022-10-12 DIAGNOSIS — L853 Xerosis cutis: Secondary | ICD-10-CM | POA: Diagnosis not present

## 2022-10-12 DIAGNOSIS — C4441 Basal cell carcinoma of skin of scalp and neck: Secondary | ICD-10-CM

## 2022-10-12 DIAGNOSIS — L570 Actinic keratosis: Secondary | ICD-10-CM

## 2022-10-12 DIAGNOSIS — L578 Other skin changes due to chronic exposure to nonionizing radiation: Secondary | ICD-10-CM | POA: Diagnosis not present

## 2022-10-12 DIAGNOSIS — L729 Follicular cyst of the skin and subcutaneous tissue, unspecified: Secondary | ICD-10-CM

## 2022-10-12 DIAGNOSIS — Z85828 Personal history of other malignant neoplasm of skin: Secondary | ICD-10-CM | POA: Diagnosis not present

## 2022-10-12 DIAGNOSIS — D492 Neoplasm of unspecified behavior of bone, soft tissue, and skin: Secondary | ICD-10-CM

## 2022-10-12 NOTE — Patient Instructions (Addendum)
Cryotherapy Aftercare  Wash gently with soap and water everyday.   Apply Vaseline Jelly daily until healed.     To help smooth the skin: Recommend starting moisturizer with exfoliant (Urea, Salicylic acid, or Lactic acid) one to two times daily to help smooth rough and bumpy skin.  OTC options include Cetaphil Rough and Bumpy lotion (Urea), Eucerin Roughness Relief lotion or spot treatment cream (Urea), CeraVe SA lotion/cream for Rough and Bumpy skin (Sal Acid), Gold Bond Rough and Bumpy cream (Sal Acid), and AmLactin 12% lotion/cream (Lactic Acid).  If applying in morning, also apply sunscreen to sun-exposed areas, since these exfoliating moisturizers can increase sensitivity to sun.    Wound Care Instructions  Cleanse wound gently with soap and water once a day then pat dry with clean gauze. Apply a thin coat of Petrolatum (petroleum jelly, "Vaseline") over the wound (unless you have an allergy to this). We recommend that you use a new, sterile tube of Vaseline. Do not pick or remove scabs. Do not remove the yellow or Hosterman "healing tissue" from the base of the wound.  Cover the wound with fresh, clean, nonstick gauze and secure with paper tape. You may use Band-Aids in place of gauze and tape if the wound is small enough, but would recommend trimming much of the tape off as there is often too much. Sometimes Band-Aids can irritate the skin.  You should call the office for your biopsy report after 1 week if you have not already been contacted.  If you experience any problems, such as abnormal amounts of bleeding, swelling, significant bruising, significant pain, or evidence of infection, please call the office immediately.  FOR ADULT SURGERY PATIENTS: If you need something for pain relief you may take 1 extra strength Tylenol (acetaminophen) AND 2 Ibuprofen (200mg  each) together every 4 hours as needed for pain. (do not take these if you are allergic to them or if you have a reason you should  not take them.) Typically, you may only need pain medication for 1 to 3 days.     Due to recent changes in healthcare laws, you may see results of your pathology and/or laboratory studies on MyChart before the doctors have had a chance to review them. We understand that in some cases there may be results that are confusing or concerning to you. Please understand that not all results are received at the same time and often the doctors may need to interpret multiple results in order to provide you with the best plan of care or course of treatment. Therefore, we ask that you please give [place] 2 business days to thoroughly review all your results before contacting the office for clarification. Should we see a critical lab result, you will be contacted sooner.   If You Need Anything After Your Visit  If you have any questions or concerns for your doctor, please call our main line at [phone] and press option 4 to reach your doctor's medical assistant. If no one answers, please leave a voicemail as directed and we will return your call as soon as possible. Messages left after 4 pm will be answered the following business day.   You may also send [place] a message via MyChart. We typically respond to MyChart messages within 1-2 business days.  For prescription refills, please ask your pharmacy to contact our office. Our fax number is [phone].  If you have an urgent issue when the clinic is closed that cannot wait until the next business day,  you can page your doctor at the number below.    Please note that while we do our best to be available for urgent issues outside of office hours, we are not available 24/7.   If you have an urgent issue and are unable to reach [place], you may choose to seek medical care at your doctor's office, retail clinic, urgent care center, or emergency room.  If you have a medical emergency, please immediately call 911 or go to the emergency department.  Pager Numbers  - Dr.  Gwen Pounds: [phone]  - Dr. Neale Burly: [phone]  - Dr. Roseanne Reno: [phone]  In the event of inclement weather, please call our main line at [phone] for an update on the status of any delays or closures.  Dermatology Medication Tips: Please keep the boxes that topical medications come in in order to help keep track of the instructions about where and how to use these. Pharmacies typically print the medication instructions only on the boxes and not directly on the medication tubes.   If your medication is too expensive, please contact our office at [phone] option 4 or send [place] a message through MyChart.   We are unable to tell what your co-pay for medications will be in advance as this is different depending on your insurance coverage. However, we may be able to find a substitute medication at lower cost or fill out paperwork to get insurance to cover a needed medication.   If a prior authorization is required to get your medication covered by your insurance company, please allow [place] 1-2 business days to complete this process.  Drug prices often vary depending on where the prescription is filled and some pharmacies may offer cheaper prices.  The website www.goodrx.com contains coupons for medications through different pharmacies. The prices here do not account for what the cost may be with help from insurance (it may be cheaper with your insurance), but the website can give you the price if you did not use any insurance.  - You can print the associated coupon and take it with your prescription to the pharmacy.  - You may also stop by our office during regular business hours and pick up a GoodRx coupon card.  - If you need your prescription sent electronically to a different pharmacy, notify our office through [facility] or by phone at [phone] option 4.     Si Usted Necesita Algo Despus de Su Visita  Tambin puede enviarnos un mensaje a travs de Clinical cytogeneticist. Por lo  general respondemos a los mensajes de MyChart en el transcurso de 1 a 2 das hbiles.  Para renovar recetas, por favor pida a su farmacia que se ponga en contacto con nuestra oficina. Annie Sable de fax es [place] [phone].  Si tiene un asunto urgente cuando la clnica est cerrada y que no puede esperar hasta el siguiente da hbil, puede llamar/localizar a su doctor(a) al nmero que aparece a continuacin.   Por favor, tenga en cuenta que aunque hacemos todo lo posible para estar disponibles para asuntos urgentes fuera del horario de [place], no estamos disponibles las 24 horas del da, los 7 [address]  [address] de la [place].   Si tiene un problema urgente y no puede comunicarse con nosotros, puede optar por buscar atencin mdica  en el consultorio de su doctor(a), en una clnica privada, en un centro de atencin urgente o en una sala de emergencias.  Si tiene una emergencia mdica, por favor llame inmediatamente al 911 o vaya a  la sala de emergencias.  Nmeros de bper  - Dr. Gwen Pounds: [phone]  - Dra. Moye: [phone]  - Dra. Roseanne Reno: [phone]  En caso de inclemencias del [place], por favor llame a Lacy Duverney principal al [phone] para una actualizacin sobre el [place] de cualquier retraso o cierre.  Consejos para la medicacin en dermatologa: Por favor, guarde las cajas en las que vienen los medicamentos de uso tpico para ayudarle a seguir las instrucciones sobre dnde y cmo usarlos. Las farmacias generalmente imprimen las instrucciones del medicamento slo en las cajas y no directamente en los tubos del [place].   Si su medicamento es muy caro, por favor, pngase en contacto con Rolm Gala llamando al [phone] y presione la opcin 4 o envenos un mensaje a travs de Clinical cytogeneticist.   No podemos decirle cul ser su copago por los medicamentos por adelantado ya que esto es diferente dependiendo de la cobertura de su seguro. Sin embargo, es posible que podamos encontrar un  medicamento sustituto a Audiological scientist un formulario para que el seguro cubra el medicamento que se considera necesario.   Si se requiere una autorizacin previa para que su compaa de seguros [place] su medicamento, por favor permtanos de 1 a 2 das hbiles para completar [address]  Los precios de los medicamentos varan con frecuencia dependiendo del Environmental consultant de dnde se surte la receta y alguna farmacias pueden ofrecer precios ms baratos.  El sitio web www.goodrx.com tiene cupones para medicamentos de Health and safety inspector. Los precios aqu no tienen en cuenta lo que podra costar con la ayuda del seguro (puede ser ms barato con su seguro), pero el sitio web puede darle el precio si no utiliz Tourist information centre manager.  - Puede imprimir el cupn correspondiente y llevarlo con su receta a la farmacia.  - Tambin puede pasar por nuestra oficina durante el horario de atencin regular y Education officer, museum una tarjeta de cupones de GoodRx.  - Si necesita que su receta se enve electrnicamente a una farmacia diferente, informe a nuestra oficina a travs de MyChart de [place] o por telfono llamando al [phone] y presione la opcin 4.

## 2022-10-12 NOTE — Progress Notes (Signed)
Follow-Up Visit   Subjective  Cameron Foster. is a 82 y.o. male who presents for the following: 6 month ISK, AK follow up. ISK at left zygoma. Aks at face, ears, left forearm. Tx with LN2 at last visit. He has a few sore spots at L hand and behind L ear.  C/O dry skin at arms. States CeraVe and Jergens causes skin to break out. Thinks may be allergic to moisturizer.   Pt informed us that his wife, Claris Gower,  passed away in 08/07/22 from lung cancer.  The following portions of the chart were reviewed this encounter and updated as appropriate: medications, allergies, medical history  Review of Systems:  No other skin or systemic complaints except as noted in HPI or Assessment and Plan.  Objective  Well appearing patient in no apparent distress; mood and affect are within normal limits.  A focused examination was performed of the following areas: Face, neck, ears, arms, hands  Relevant exam findings are noted in the Assessment and Plan.  left lat eyebrow x2, left preauricular x1, left ear mid helix x1, right ear x2, right preauricular x1, R temple x1, L hand dorsum x1 (9) Erythematous thin papules/macules with gritty scale.   left post auricular x1, thenar left hand x2, right wrist x1 Erythematous keratotic or waxy stuck-on papule or plaque.  left post auricular 0.6 cm pink-Boghosian firm papule         Assessment & Plan   Xerosis - diffuse xerotic patches at arms - recommend gentle, hydrating skin care - gentle skin care handout given  Recommend starting moisturizer with exfoliant (Urea, Salicylic acid, or Lactic acid) one to two times daily to help smooth rough and bumpy skin.  OTC options include Cetaphil Rough and Bumpy lotion (Urea), Eucerin Roughness Relief lotion or spot treatment cream (Urea), CeraVe SA lotion/cream for Rough and Bumpy skin (Sal Acid), Gold Bond Rough and Bumpy cream (Sal Acid), and AmLactin 12% lotion/cream (Lactic Acid).  If applying in morning, also  apply sunscreen to sun-exposed areas, since these exfoliating moisturizers can increase sensitivity to sun.   AK (actinic keratosis) (9) left lat eyebrow x2, left preauricular x1, left ear mid helix x1, right ear x2, right preauricular x1, R temple x1, L hand dorsum x1  Actinic keratoses are precancerous spots that appear secondary to cumulative UV radiation exposure/sun exposure over time. They are chronic with expected duration over 1 year. A portion of actinic keratoses will progress to squamous cell carcinoma of the skin. It is not possible to reliably predict which spots will progress to skin cancer and so treatment is recommended to prevent development of skin cancer.  Recommend daily broad spectrum sunscreen SPF 30+ to sun-exposed areas, reapply every 2 hours as needed.  Recommend staying in the shade or wearing long sleeves, sun glasses (UVA+UVB protection) and wide brim hats (4-inch brim around the entire circumference of the hat). Call for new or changing lesions.  Destruction of lesion - left lat eyebrow x2, left preauricular x1, left ear mid helix x1, right ear x2, right preauricular x1, R temple x1, L hand dorsum x1  Destruction method: cryotherapy   Informed consent: discussed and consent obtained   Lesion destroyed using liquid nitrogen: Yes   Region frozen until ice ball extended beyond lesion: Yes   Outcome: patient tolerated procedure well with no complications   Post-procedure details: wound care instructions given   Additional details:  Prior to procedure, discussed risks of blister formation, small wound, skin  dyspigmentation, or rare scar following cryotherapy. Recommend Vaseline ointment to treated areas while healing.   Inflamed seborrheic keratosis left post auricular x1, thenar left hand x2, right wrist x1  Symptomatic, irritating, patient would like treated.  Destruction of lesion - left post auricular x1, thenar left hand x2, right wrist x1  Destruction  method: cryotherapy   Informed consent: discussed and consent obtained   Lesion destroyed using liquid nitrogen: Yes   Region frozen until ice ball extended beyond lesion: Yes   Outcome: patient tolerated procedure well with no complications   Post-procedure details: wound care instructions given   Additional details:  Prior to procedure, discussed risks of blister formation, small wound, skin dyspigmentation, or rare scar following cryotherapy. Recommend Vaseline ointment to treated areas while healing.   Neoplasm of skin left post auricular  Epidermal / dermal shaving  Lesion diameter (cm):  0.6 Informed consent: discussed and consent obtained   Patient was prepped and draped in usual sterile fashion: Area prepped with alcohol. Anesthesia: the lesion was anesthetized in a standard fashion   Anesthetic:  1% lidocaine w/ epinephrine 1-100,000 buffered w/ 8.4% NaHCO3 Instrument used: flexible razor blade   Hemostasis achieved with: pressure, aluminum chloride and electrodesiccation   Outcome: patient tolerated procedure well    Destruction of lesion  Destruction method: electrodesiccation and curettage   Curettage performed in three different directions: Yes   Electrodesiccation performed over the curetted area: Yes   Final wound size (cm):  0.9 Hemostasis achieved with:  pressure, aluminum chloride and electrodesiccation Outcome: patient tolerated procedure well with no complications   Post-procedure details: wound care instructions given   Additional details:  Mupirocin ointment and Bandaid applied   Specimen 1 - Surgical pathology Differential Diagnosis: Nevus vs Scar, R/O BCC EDC today  Check Margins: No   EPIDERMAL INCLUSION CYST vs ganglion cyst Exam: Firm blue sq nodule ~1 cm at left thenar hand, non tender  Benign-appearing. Exam most consistent with an epidermal inclusion cyst vs ganglion cyst. Discussed that a cyst is a benign growth that can grow over time and  sometimes get irritated or inflamed. Recommend observation if it is not bothersome. Discussed option of surgical excision to remove it if it is growing, symptomatic, or other changes noted. Please call for new or changing lesions so they can be evaluated.  ACTINIC DAMAGE - chronic, secondary to cumulative UV radiation exposure/sun exposure over time - diffuse scaly erythematous macules with underlying dyspigmentation - Recommend daily broad spectrum sunscreen SPF 30+ to sun-exposed areas, reapply every 2 hours as needed.  - Recommend staying in the shade or wearing long sleeves, sun glasses (UVA+UVB protection) and wide brim hats (4-inch brim around the entire circumference of the hat). - Call for new or changing lesions.   HISTORY OF BASAL CELL CARCINOMA OF THE SKIN - No evidence of recurrence today - Recommend regular full body skin exams - Recommend daily broad spectrum sunscreen SPF 30+ to sun-exposed areas, reapply every 2 hours as needed.  - Call if any new or changing lesions are noted between office visits  HISTORY OF SQUAMOUS CELL CARCINOMA OF THE SKIN - No evidence of recurrence today - Recommend regular full body skin exams - Recommend daily broad spectrum sunscreen SPF 30+ to sun-exposed areas, reapply every 2 hours as needed.  - Call if any new or changing lesions are noted between office visits   Return in about 6 months (around 04/13/2023) for AK Follow Up.  I, Lawson Radar, CMA, am  acting as scribe for Willeen Niece, MD.   Documentation: I have reviewed the above documentation for accuracy and completeness, and I agree with the above.  Willeen Niece, MD

## 2022-10-17 ENCOUNTER — Telehealth: Payer: Self-pay

## 2022-10-17 NOTE — Telephone Encounter (Signed)
-----   Message from Willeen Niece, MD sent at 10/17/2022  2:15 PM EDT ----- Skin , left post auricular BASAL CELL CARCINOMA WITH FOCAL SCLEROSIS, SEE DESCRIPTION  BCC skin cancer- already treated with EDC at time of biopsy    - please call patient

## 2022-10-17 NOTE — Telephone Encounter (Signed)
Advised pt of bx results/sh ?

## 2022-10-24 ENCOUNTER — Ambulatory Visit (INDEPENDENT_AMBULATORY_CARE_PROVIDER_SITE_OTHER): Payer: No Typology Code available for payment source | Admitting: Family Medicine

## 2022-10-24 ENCOUNTER — Encounter: Payer: Self-pay | Admitting: Family Medicine

## 2022-10-24 VITALS — BP 102/68 | HR 66 | Temp 98.1°F | Resp 12 | Ht 69.0 in | Wt 211.7 lb

## 2022-10-24 DIAGNOSIS — M48061 Spinal stenosis, lumbar region without neurogenic claudication: Secondary | ICD-10-CM

## 2022-10-24 DIAGNOSIS — F4321 Adjustment disorder with depressed mood: Secondary | ICD-10-CM

## 2022-10-24 DIAGNOSIS — M5416 Radiculopathy, lumbar region: Secondary | ICD-10-CM | POA: Insufficient documentation

## 2022-10-24 DIAGNOSIS — F320 Major depressive disorder, single episode, mild: Secondary | ICD-10-CM | POA: Diagnosis not present

## 2022-10-24 MED ORDER — SERTRALINE HCL 100 MG PO TABS
150.0000 mg | ORAL_TABLET | Freq: Every day | ORAL | 1 refills | Status: DC
Start: 2022-10-24 — End: 2023-03-27

## 2022-10-24 MED ORDER — GABAPENTIN 100 MG PO CAPS: 100.0000 mg | ORAL_CAPSULE | Freq: Three times a day (TID) | ORAL | 1 refills | Status: DC

## 2022-10-24 NOTE — Progress Notes (Signed)
Established Patient Office Visit  Subjective   Patient ID: Cameron Foster., male    DOB: 11/03/1940  Age: 82 y.o. MRN: [id number]  Chief Complaint  Patient presents with   Medical Management of Chronic Issues    The patient is here today for a medication follow up.  After losing his wife in April of this year, the patient has been struggling with grief and feelings of depression. At his last visit in May, he starting taking Sertraline 100 mg. He is unsure If the medicine is helping him. It is hard for him to distinguish if what he is feeling is a normal grief response and if the medicine would help him.  He endorses having an irregular sleep schedule and he has been sleeping a lot. He says he does not have any trouble getting out of bed and going about day to day life. He is mainly struggling with grasping his wife's absence and doing day to day chores.  At this visit, he also mentioned his chronic back pain and radiculopathy given him issues. He is set up to see neurology. He has previously tried Gabapentin 300 mg TID and muscle relaxants with no relief. He was told he had bursitis by his orthopedic physician and was given a glucocorticoid injection.     Review of Systems  Musculoskeletal:  Positive for back pain and joint pain.  Psychiatric/Behavioral:  Positive for depression. Negative for suicidal ideas. The patient is not nervous/anxious.      Objective:     BP 102/68 (BP Location: Left Arm, Patient Position: Sitting, Cuff Size: Large)   Pulse 66   Temp 98.1 F (36.7 C) (Temporal)   Resp 12   Ht 5\' 9"  (1.753 m)   Wt 211 lb 11.2 oz (96 kg)   BMI 31.26 kg/m  BP Readings from Last 3 Encounters:  10/24/22 102/68  09/08/22 139/60  08/11/22 118/76      Physical Exam Constitutional:      Appearance: Normal appearance.  Neurological:     General: No focal deficit present.     Mental Status: He is alert and oriented to person, place, and time.    No results found for  any visits on 10/24/22.  The ASCVD Risk score (Arnett DK, et al., 2019) failed to calculate for the following reasons:   The 2019 ASCVD risk score is only valid for ages 60 to 13   The patient has a prior MI or stroke diagnosis    Assessment & Plan:   Problem List Items Addressed This Visit       Nervous and Auditory   Lumbar radiculopathy - Primary    Is currently being followed for known lumbar radiculopathy with spinal stenosis. Has gotten worse over time and he is know having problems rising from a sitting position. Is followed by ortho and has a known history of sciatica.  Gabapentin 100 mg TID  Referral to PM&R for injections         Relevant Medications   gabapentin (NEURONTIN) 100 MG capsule   sertraline (ZOLOFT) 100 MG tablet   Other Relevant Orders   Ambulatory referral to Physical Medicine Rehab     Other   Grief    Patient has been experiencing grief since the passing of his wife in April. He attends grief counseling regularly. He does not think his mood has improved or gotten worse since his last visit.  Increase Sertraline to 150 mg once a day  Consider  changing to a different medication if no improvement at the next visit      Depression, major, single episode, mild (HCC)   Relevant Medications   sertraline (ZOLOFT) 100 MG tablet   Other Visit Diagnoses     Spinal stenosis of lumbar region, unspecified whether neurogenic claudication present       Relevant Orders   Ambulatory referral to Physical Medicine Rehab       Return in about 2 months (around 12/25/2022) for chronic disease f/u.    Rometta Emery, Medical Student   Patient seen along with MS3 student Jodi Marble. I personally evaluated this patient along with the student, and verified all aspects of the history, physical exam, and medical decision making as documented by the student. I agree with the student's documentation and have made all necessary edits.  Marvel Sapp, Marzella Schlein, MD,  MP[facility]

## 2022-10-24 NOTE — Assessment & Plan Note (Signed)
Patient has been experiencing grief since the passing of his wife in April. He attends grief counseling regularly. He does not think his mood has improved or gotten worse since his last visit.  Increase Sertraline to 150 mg once a day  Consider changing to a different medication if no improvement at the next visit

## 2022-10-24 NOTE — Assessment & Plan Note (Signed)
Is currently being followed for known lumbar radiculopathy with spinal stenosis. Has gotten worse over time and he is know having problems rising from a sitting position. Is followed by ortho and has a known history of sciatica.  Gabapentin 100 mg TID  Referral to PM&R for injections

## 2022-10-26 ENCOUNTER — Telehealth: Payer: Self-pay | Admitting: Family Medicine

## 2022-10-26 NOTE — Telephone Encounter (Signed)
CoverMyMeds -  Confirmation of Prior [employer] Code - [id number]

## 2022-11-10 NOTE — Telephone Encounter (Signed)
PA not done. Dx ED, not covered.

## 2022-11-17 DIAGNOSIS — H04123 Dry eye syndrome of bilateral lacrimal glands: Secondary | ICD-10-CM | POA: Diagnosis not present

## 2022-11-18 DIAGNOSIS — M7062 Trochanteric bursitis, left hip: Secondary | ICD-10-CM | POA: Diagnosis not present

## 2022-11-18 DIAGNOSIS — M5416 Radiculopathy, lumbar region: Secondary | ICD-10-CM | POA: Diagnosis not present

## 2022-11-18 DIAGNOSIS — Z96651 Presence of right artificial knee joint: Secondary | ICD-10-CM | POA: Diagnosis not present

## 2022-11-22 DIAGNOSIS — G8929 Other chronic pain: Secondary | ICD-10-CM | POA: Diagnosis not present

## 2022-11-22 DIAGNOSIS — M5416 Radiculopathy, lumbar region: Secondary | ICD-10-CM | POA: Diagnosis not present

## 2022-11-22 DIAGNOSIS — M5442 Lumbago with sciatica, left side: Secondary | ICD-10-CM | POA: Diagnosis not present

## 2022-12-01 DIAGNOSIS — M5416 Radiculopathy, lumbar region: Secondary | ICD-10-CM | POA: Diagnosis not present

## 2022-12-02 ENCOUNTER — Other Ambulatory Visit: Payer: Self-pay | Admitting: Family Medicine

## 2022-12-02 DIAGNOSIS — I110 Hypertensive heart disease with heart failure: Secondary | ICD-10-CM

## 2022-12-02 DIAGNOSIS — I10 Essential (primary) hypertension: Secondary | ICD-10-CM

## 2022-12-12 DIAGNOSIS — G5793 Unspecified mononeuropathy of bilateral lower limbs: Secondary | ICD-10-CM | POA: Diagnosis not present

## 2022-12-12 DIAGNOSIS — R2689 Other abnormalities of gait and mobility: Secondary | ICD-10-CM | POA: Diagnosis not present

## 2022-12-12 DIAGNOSIS — Z8673 Personal history of transient ischemic attack (TIA), and cerebral infarction without residual deficits: Secondary | ICD-10-CM | POA: Diagnosis not present

## 2022-12-21 ENCOUNTER — Ambulatory Visit: Payer: Self-pay

## 2022-12-21 ENCOUNTER — Ambulatory Visit
Admission: RE | Admit: 2022-12-21 | Discharge: 2022-12-21 | Disposition: A | Discharge status: Home / Self Care | Payer: No Typology Code available for payment source | Source: Ambulatory Visit | Attending: Physician Assistant | Admitting: Physician Assistant

## 2022-12-21 ENCOUNTER — Encounter: Payer: Self-pay | Admitting: Physician Assistant

## 2022-12-21 ENCOUNTER — Ambulatory Visit: Payer: No Typology Code available for payment source | Admitting: Physician Assistant

## 2022-12-21 VITALS — BP 124/82 | HR 67 | Ht 69.0 in | Wt 207.3 lb

## 2022-12-21 DIAGNOSIS — G8929 Other chronic pain: Secondary | ICD-10-CM

## 2022-12-21 DIAGNOSIS — S0993XA Unspecified injury of face, initial encounter: Secondary | ICD-10-CM | POA: Insufficient documentation

## 2022-12-21 DIAGNOSIS — F321 Major depressive disorder, single episode, moderate: Secondary | ICD-10-CM

## 2022-12-21 DIAGNOSIS — F4321 Adjustment disorder with depressed mood: Secondary | ICD-10-CM

## 2022-12-21 DIAGNOSIS — R0989 Other specified symptoms and signs involving the circulatory and respiratory systems: Secondary | ICD-10-CM

## 2022-12-21 DIAGNOSIS — M545 Low back pain, unspecified: Secondary | ICD-10-CM | POA: Diagnosis not present

## 2022-12-21 DIAGNOSIS — F339 Major depressive disorder, recurrent, unspecified: Secondary | ICD-10-CM

## 2022-12-21 NOTE — Progress Notes (Signed)
Established patient visit  Patient: Cameron Foster.   DOB: Jul 20, 1940   82 y.o. Male  MRN: [id number] Visit Date: 12/21/2022  Today's healthcare provider: Debera Lat, PA-C   Chief Complaint  Patient presents with   Medical Management of Chronic Issues    Patient took a fall last week trying on pants and is sore on left side, sinus headache, no prior treatment    Subjective      Discussed the use of AI scribe software for clinical note transcription with the patient, who gave verbal consent to proceed.  History of Present Illness   The patient, with a history of stroke and atrial fibrillation, presents after a recent fall where he hit his head. Since the fall, he has been experiencing sinus issues, which he describes as a feeling of tightness. He is concerned about potential internal bleeding or damage to his sinus cavities. He also mentions a sore shoulder, which he is unable to lift up.  The patient also reports a significant weight loss and excessive sleep since the death of his spouse in Aug 16, 2022. He denies feeling depressed but acknowledges that he might be. He has been married for 61 years and his spouse had a history of cancer and COPD.  The patient also mentions a history of back pain for which he has received steroid shots in the past. He reports that the shots have been less effective over time. He has also tried physical therapy, which he found unsatisfactory.           10/24/2022    2:17 PM 08/11/2022    3:53 PM 05/25/2022    2:53 PM  Depression screen PHQ 2/9  Decreased Interest 1 3 1   Down, Depressed, Hopeless 1 3 0  PHQ - 2 Score 2 6 1   Altered sleeping 2 2 1   Tired, decreased energy 2 3 1   Change in appetite 0 0 0  Feeling bad or failure about yourself  0 0 0  Trouble concentrating 0 1 0  Moving slowly or fidgety/restless 0 0 0  Suicidal thoughts 0 0 0  PHQ-9 Score 6 12 3   Difficult doing work/chores Somewhat difficult Extremely dIfficult Not difficult at all       07/06/2021    1:13 PM  GAD 7 : Generalized Anxiety Score  Nervous, Anxious, on Edge 2  Control/stop worrying 2  Worry too much - different things 1  Trouble relaxing 1  Restless 0  Easily annoyed or irritable 0  Afraid - awful might happen 0  Total GAD 7 Score 6  Anxiety Difficulty Not difficult at all    Medications: Outpatient Medications Prior to Visit  Medication Sig   amLODipine (NORVASC) 5 MG tablet TAKE 1 TABLET BY MOUTH EVERY DAY   aspirin 81 MG chewable tablet Chew 1 tablet (81 mg total) by mouth 2 (two) times daily.   atorvastatin (LIPITOR) 40 MG tablet Take 1 tablet (40 mg total) by mouth daily.   docusate sodium (COLACE) 100 MG capsule Take 1 capsule (100 mg total) by mouth 2 (two) times daily. (Patient taking differently: Take 200 mg by mouth daily.)   fluticasone (FLONASE) 50 MCG/ACT nasal spray PLACE 1 SPRAY INTO BOTH NOSTRILS DAILY AS NEEDED FOR ALLERGIES OR RHINITIS.   gabapentin (NEURONTIN) 100 MG capsule Take 1 capsule (100 mg total) by mouth 3 (three) times daily.   hydrocortisone 2.5 % cream APPLY TO AFFECTED AREA TWICE A DAY AS NEEDED FOR RASH   ibuprofen (ADVIL)  200 MG tablet Take 400 mg by mouth every 6 (six) hours as needed.   lisinopril-hydrochlorothiazide (ZESTORETIC) 10-12.5 MG tablet Take 1 tablet by mouth daily.   metoprolol tartrate (LOPRESSOR) 100 MG tablet TAKE 1 TABLET BY MOUTH TWICE A DAY   Multiple Vitamin (MULTIVITAMIN WITH MINERALS) TABS tablet Take 1 tablet by mouth daily.   omeprazole (PRILOSEC) 20 MG capsule Take 1 capsule (20 mg total) by mouth daily. Reports currently taking as needed   sertraline (ZOLOFT) 100 MG tablet Take 1.5 tablets (150 mg total) by mouth daily.   vitamin B-12 (CYANOCOBALAMIN) 1000 MCG tablet Take 1,000 mcg by mouth daily.   nicotine polacrilex ([employer] NICOTINE) 2 MG lozenge Take 1 lozenge (2 mg total) by mouth as needed for smoking cessation. (Patient not taking: Reported on 12/21/2022)   sildenafil (REVATIO) 20 MG  tablet Take 1 tablet (20 mg total) by mouth daily. (Patient not taking: Reported on 12/21/2022)   No facility-administered medications prior to visit.    Review of Systems  All other systems reviewed and are negative.  Except see HPI       Objective    BP 124/82 (BP Location: Left Arm, Patient Position: Sitting, Cuff Size: Normal)   Pulse 67   Ht 5\' 9"  (1.753 m)   Wt 207 lb 4.8 oz (94 kg)   SpO2 98%   BMI 30.61 kg/m     Physical Exam Vitals reviewed.  Constitutional:      General: He is not in acute distress.    Appearance: Normal appearance. He is not diaphoretic.  HENT:     Head: Normocephalic and atraumatic.     Right Ear: Tympanic membrane, ear canal and external ear normal.     Left Ear: Tympanic membrane, ear canal and external ear normal.     Nose: No congestion or rhinorrhea.     Mouth/Throat:     Pharynx: No posterior oropharyngeal erythema.     Comments: Postnasal drainage  Eyes:     General: No scleral icterus.       Right eye: No discharge.        Left eye: No discharge.     Extraocular Movements: Extraocular movements intact.     Conjunctiva/sclera: Conjunctivae normal.     Pupils: Pupils are equal, round, and reactive to light.  Cardiovascular:     Rate and Rhythm: Normal rate and regular rhythm.     Pulses: Normal pulses.     Heart sounds: Normal heart sounds. No murmur heard. Pulmonary:     Effort: Pulmonary effort is normal. No respiratory distress.     Breath sounds: Normal breath sounds. No wheezing or rhonchi.  Musculoskeletal:     Cervical back: Neck supple.     Right lower leg: No edema.     Left lower leg: No edema.  Lymphadenopathy:     Cervical: No cervical adenopathy.  Skin:    General: Skin is warm and dry.     Findings: Bruising (right side of face, right arm) and lesion (right arm) present. No rash.  Neurological:     Mental Status: He is alert and oriented to person, place, and time. Mental status is at baseline.     Cranial  Nerves: No cranial nerve deficit.     Motor: No weakness.     Gait: Gait abnormal (ambulates with cane).  Psychiatric:        Mood and Affect: Mood normal.        Behavior: Behavior normal.  No results found for any visits on 12/21/22.  Assessment & Plan        Fall with Head Trauma X less than a week Normal vitals Patient fell and hit his head. No loss of consciousness, no vision changes, no slurred speech, no weakness or numbness. No pain on palpation of the head. Neurological examination was normal. -Order CT scan of the head to rule out any internal injury.  Chronic Sinusitis Longstanding history of sinus issues. No acute changes or worsening of symptoms. -Continue symptomatic management, see below  Chronic Back Pain History of back pain with some relief from corticosteroid injections in the past. Patient has tried physical therapy with limited benefit. -Encourage continuation of physical therapy and consider repeat corticosteroid injection if pain worsens.  Grief Recent loss of spouse with subsequent weight loss and increased sleep. Patient denies feeling depressed but acknowledges the possibility. -Monitor for signs of depression and consider initiating treatment if symptoms worsen.  General Health Maintenance -Encourage use of over-the-counter medications such as Allegra, Claritin, or Zyrtec for sinus symptoms   No follow-ups on file.    The patient was advised to call back or seek an in-person evaluation if the symptoms worsen or if the condition fails to improve as anticipated.  I discussed the assessment and treatment plan with the patient. The patient was provided an opportunity to ask questions and all were answered. The patient agreed with the plan and demonstrated an understanding of the instructions.  I, Debera Lat, PA-C have reviewed all documentation for this visit. The documentation on  12/21/22  for the exam, diagnosis, procedures, and orders are all  accurate and complete.  Debera Lat, [facility], MMS [facility] [phone] (phone) [phone] (fax)   [facility]

## 2022-12-21 NOTE — Telephone Encounter (Signed)
     Chief Complaint: Larey Seat last weekend at home, putting on pants. Hit forehead, has black right eye. Is having "sinus headaches." Is not on blood thinner. Symptoms: Above Frequency: Last weekend Pertinent Negatives: Patient denies any other symptoms. Disposition: [] ED /[] Urgent Care (no appt availability in office) / [x] Appointment(In office/virtual)/ []  [place] [facility]/ [] Home Care/ [] Refused Recommended Disposition /[] [place] [facility]/ []  Follow-up with PCP Additional Notes: Pt. Agrees with appointment today.  Reason for Disposition  MILD weakness (i.e., does not interfere with ability to work, go to school, normal activities)  (Exception: Mild weakness is a chronic symptom.)  Answer Assessment - Initial Assessment Questions 1. MECHANISM: "How did the fall happen?"     Putting on pants, hit head on front forehead, Right eye black 2. DOMESTIC VIOLENCE AND ELDER ABUSE SCREENING: "Did you fall because someone pushed you or tried to hurt you?" If Yes, ask: "Are you safe now?"     No 3. ONSET: "When did the fall happen?" (e.g., minutes, hours, or days ago)     Last weekend 4. LOCATION: "What part of the body hit the ground?" (e.g., back, buttocks, head, hips, knees, hands, head, stomach)     Forward 5. INJURY: "Did you hurt (injure) yourself when you fell?" If Yes, ask: "What did you injure? Tell me more about this?" (e.g., body area; type of injury; pain severity)"     Black eye 6. PAIN: "Is there any pain?" If Yes, ask: "How bad is the pain?" (e.g., Scale 1-10; or mild,  moderate, severe)   - NONE (0): No pain   - MILD (1-3): Doesn't interfere with normal activities    - MODERATE (4-7): Interferes with normal activities or awakens from sleep    - SEVERE (8-10): Excruciating pain, unable to do any normal activities      "Sinus headache"  2-3 7. SIZE: For cuts, bruises, or swelling, ask: "How large is it?" (e.g., inches or centimeters)      Right arm - skin tear 8.  PREGNANCY: "Is there any chance you are pregnant?" "When was your last menstrual period?"     N/a 9. OTHER SYMPTOMS: "Do you have any other symptoms?" (e.g., dizziness, fever, weakness; new onset or worsening).      No 10. CAUSE: "What do you think caused the fall (or falling)?" (e.g., tripped, dizzy spell)       Tripped.  Protocols used: [facility]

## 2022-12-22 DIAGNOSIS — S0993XA Unspecified injury of face, initial encounter: Secondary | ICD-10-CM | POA: Insufficient documentation

## 2022-12-26 ENCOUNTER — Encounter: Payer: Self-pay | Admitting: Family Medicine

## 2022-12-26 ENCOUNTER — Telehealth (INDEPENDENT_AMBULATORY_CARE_PROVIDER_SITE_OTHER): Payer: No Typology Code available for payment source | Admitting: Family Medicine

## 2022-12-26 VITALS — BP 131/78 | HR 67

## 2022-12-26 DIAGNOSIS — F320 Major depressive disorder, single episode, mild: Secondary | ICD-10-CM | POA: Diagnosis not present

## 2022-12-26 DIAGNOSIS — I10 Essential (primary) hypertension: Secondary | ICD-10-CM

## 2022-12-26 DIAGNOSIS — Z6832 Body mass index (BMI) 32.0-32.9, adult: Secondary | ICD-10-CM

## 2022-12-26 DIAGNOSIS — N1831 Chronic kidney disease, stage 3a: Secondary | ICD-10-CM

## 2022-12-26 DIAGNOSIS — I4891 Unspecified atrial fibrillation: Secondary | ICD-10-CM | POA: Diagnosis not present

## 2022-12-26 DIAGNOSIS — E785 Hyperlipidemia, unspecified: Secondary | ICD-10-CM

## 2022-12-26 DIAGNOSIS — G478 Other sleep disorders: Secondary | ICD-10-CM

## 2022-12-26 DIAGNOSIS — E669 Obesity, unspecified: Secondary | ICD-10-CM

## 2022-12-26 DIAGNOSIS — R7303 Prediabetes: Secondary | ICD-10-CM | POA: Insufficient documentation

## 2022-12-26 NOTE — Progress Notes (Signed)
MyChart Video Visit    Virtual Visit via Video Note   This format is felt to be most appropriate for this patient at this time. Physical exam was limited by quality of the video and audio technology used for the visit.    Patient location: home Provider location: [facility] Persons involved in the visit: patient, provider   I discussed the limitations of evaluation and management by telemedicine and the availability of in person appointments. The patient expressed understanding and agreed to proceed.  Patient: Cameron Foster.   DOB: 1941-04-17   82 y.o. Male  MRN: [id number] Visit Date: 12/26/2022  Today's healthcare provider: Shirlee Latch, MD   No chief complaint on file.  Subjective    HPI  Discussed the use of AI scribe software for clinical note transcription with the patient, who gave verbal consent to proceed.  History of Present Illness   The patient, with a history of hypertension, presents for a routine follow-up. He reports excessive sleep, requiring about 12 hours to feel rested. He denies difficulty falling asleep or staying asleep, but reports feeling unwell if he sleeps less than 12 hours. He denies excessive snoring "like that".  The patient also reports persistent back pain despite recent spinal injections, which did alleviate leg pain. He is scheduled for a follow-up with his pain management provider. He also reports new knee pain, which he suspects may require intervention.  The patient's blood pressure is well-controlled on his current regimen of amlodipine, lisinopril HCTZ, and metoprolol. He also takes atorvastatin. He reports no issues with his mood on Zoloft.  The patient receives Meals on Wheels and notes that his meals often include rice or potatoes. He also consumes a cup of red wine nightly. He was recently found to have a high A1c, placing him in the pre-diabetic range.        Medications: Outpatient Medications Prior to  Visit  Medication Sig   amLODipine (NORVASC) 5 MG tablet TAKE 1 TABLET BY MOUTH EVERY DAY   aspirin 81 MG chewable tablet Chew 1 tablet (81 mg total) by mouth 2 (two) times daily.   atorvastatin (LIPITOR) 40 MG tablet Take 1 tablet (40 mg total) by mouth daily.   docusate sodium (COLACE) 100 MG capsule Take 1 capsule (100 mg total) by mouth 2 (two) times daily. (Patient taking differently: Take 200 mg by mouth daily.)   fluticasone (FLONASE) 50 MCG/ACT nasal spray PLACE 1 SPRAY INTO BOTH NOSTRILS DAILY AS NEEDED FOR ALLERGIES OR RHINITIS.   gabapentin (NEURONTIN) 100 MG capsule Take 1 capsule (100 mg total) by mouth 3 (three) times daily.   hydrocortisone 2.5 % cream APPLY TO AFFECTED AREA TWICE A DAY AS NEEDED FOR RASH   ibuprofen (ADVIL) 200 MG tablet Take 400 mg by mouth every 6 (six) hours as needed.   lisinopril-hydrochlorothiazide (ZESTORETIC) 10-12.5 MG tablet Take 1 tablet by mouth daily.   metoprolol tartrate (LOPRESSOR) 100 MG tablet TAKE 1 TABLET BY MOUTH TWICE A DAY   Multiple Vitamin (MULTIVITAMIN WITH MINERALS) TABS tablet Take 1 tablet by mouth daily.   omeprazole (PRILOSEC) 20 MG capsule Take 1 capsule (20 mg total) by mouth daily. Reports currently taking as needed   sertraline (ZOLOFT) 100 MG tablet Take 1.5 tablets (150 mg total) by mouth daily.   vitamin B-12 (CYANOCOBALAMIN) 1000 MCG tablet Take 1,000 mcg by mouth daily.   [DISCONTINUED] nicotine polacrilex ([facility] NICOTINE) 2 MG lozenge Take 1 lozenge (2 mg total) by  mouth as needed for smoking cessation. (Patient not taking: Reported on 12/21/2022)   [DISCONTINUED] sildenafil (REVATIO) 20 MG tablet Take 1 tablet (20 mg total) by mouth daily. (Patient not taking: Reported on 12/21/2022)   No facility-administered medications prior to visit.    Review of Systems      Objective    BP 131/78 (BP Location: Left Arm, Patient Position: Sitting, Cuff Size: Normal)   Pulse 67       Physical Exam Constitutional:       General: He is not in acute distress.    Appearance: Normal appearance. He is not diaphoretic.  HENT:     Head: Normocephalic.  Eyes:     Conjunctiva/sclera: Conjunctivae normal.  Pulmonary:     Effort: Pulmonary effort is normal. No respiratory distress.  Neurological:     Mental Status: He is alert and oriented to person, place, and time. Mental status is at baseline.        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[facility])    F/b cardiology One episode No longer on anticoag - due to risk of falls       Relevant Orders   AMB Referral to Pharmacy Medication Management     Genitourinary   Stage 3a chronic kidney disease (HCC)   Relevant Orders   AMB Referral to Pharmacy Medication Management     Other   Depression, major, single episode, mild (HCC)    Continue grief counseling Continue zoloft at current dose      Relevant Orders   AMB Referral to Pharmacy Medication Management   Obesity    Discussed importance of healthy weight management Discussed diet and exercise       Relevant Orders   AMB Referral to Pharmacy Medication Management   Hyperlipidemia LDL goal <70    Previously well controlled Continue statin Repeat FLP and CMP annually Goal LDL < 70      Relevant Orders   AMB Referral to Pharmacy Medication Management   Prediabetes   Relevant Orders   AMB Referral to Pharmacy Medication Management   Other Visit Diagnoses     Non-restorative sleep       Relevant Orders   Ambulatory referral to Sleep Studies   AMB Referral to Pharmacy Medication Management           Hypertension Blood pressure well controlled on current regimen of Amlodipine, Lisinopril HCTZ, and Metoprolol. -Continue current medications. -Next blood pressure check in 3  months.  Excessive Sleep, non restorative sleep Reports needing 12 hours of sleep to feel rested. No known snoring or other symptoms of sleep apnea. -Order home sleep study to evaluate for possible sleep apnea.  Depression On Zoloft, mood stable. -Continue Zoloft.  Chronic Back Pain Reports relief in leg pain but not back pain after spinal injections. -Scheduled for follow-up with pain management.  Prediabetes A1c in prediabetic range. -Continue monitoring carb intake. -Recheck A1c at next visit.  Follow-up in 3 months for routine check and to discuss results of sleep study.     Would like to get back in touch with clinical pharmacy to ensure he is taking the right medications   Return in about 3 months (around 03/27/2023) for chronic disease f/u.     I discussed the  assessment and treatment plan with the patient. The patient was provided an opportunity to ask questions and all were answered. The patient agreed with the plan and demonstrated an understanding of the instructions.   The patient was advised to call back or seek an in-person evaluation if the symptoms worsen or if the condition fails to improve as anticipated.   Shirlee Latch, MD [facility] [phone] (phone) [phone] (fax)  [facility]

## 2022-12-26 NOTE — Assessment & Plan Note (Signed)
Discussed importance of healthy weight management Discussed diet and exercise  

## 2022-12-26 NOTE — Addendum Note (Signed)
Addended by: Erasmo Downer on: 12/26/2022 03:08 PM   Modules accepted: Level of Service

## 2022-12-26 NOTE — Assessment & Plan Note (Signed)
F/b cardiology One episode No longer on anticoag - due to risk of falls

## 2022-12-26 NOTE — Assessment & Plan Note (Signed)
Previously well controlled ?Continue statin ?Repeat FLP and CMP annually ?Goal LDL < 70  ?

## 2022-12-26 NOTE — Assessment & Plan Note (Signed)
Continue grief counseling Continue zoloft at current dose

## 2023-01-01 ENCOUNTER — Other Ambulatory Visit: Payer: Self-pay | Admitting: Family Medicine

## 2023-01-02 ENCOUNTER — Telehealth: Payer: Self-pay

## 2023-01-02 NOTE — Progress Notes (Signed)
Care Guide Note  01/02/2023 Name: Cameron Foster. MRN: [id number] DOB: [birth year]  Referred by: Erasmo Downer, MD Reason for referral : Care Coordination (Outreach to schedule with Pharm d )   Cameron Foster. is a 82 y.o. year old male who is a primary care patient of Beryle Flock, Marzella Schlein, MD. Cameron Foster. was referred to the pharmacist for assistance related to DM.    Successful contact was made with the patient to discuss pharmacy services including being ready for the pharmacist to call at least 5 minutes before the scheduled appointment time, to have medication bottles and any blood sugar or blood pressure readings ready for review. The patient agreed to meet with the pharmacist via with the pharmacist via telephone visit on (date/time).  02/15/2023  Penne Lash, RMA Care Guide [facility]  [place], [place] [address] Direct Dial: [phone] [email or web address]

## 2023-01-09 ENCOUNTER — Encounter: Payer: Self-pay | Admitting: Family Medicine

## 2023-01-10 DIAGNOSIS — M5416 Radiculopathy, lumbar region: Secondary | ICD-10-CM | POA: Diagnosis not present

## 2023-01-10 DIAGNOSIS — M5442 Lumbago with sciatica, left side: Secondary | ICD-10-CM | POA: Diagnosis not present

## 2023-01-10 DIAGNOSIS — G8929 Other chronic pain: Secondary | ICD-10-CM | POA: Diagnosis not present

## 2023-01-12 DIAGNOSIS — I129 Hypertensive chronic kidney disease with stage 1 through stage 4 chronic kidney disease, or unspecified chronic kidney disease: Secondary | ICD-10-CM | POA: Diagnosis not present

## 2023-01-12 DIAGNOSIS — R2681 Unsteadiness on feet: Secondary | ICD-10-CM | POA: Diagnosis not present

## 2023-01-12 DIAGNOSIS — F17211 Nicotine dependence, cigarettes, in remission: Secondary | ICD-10-CM | POA: Diagnosis not present

## 2023-01-12 DIAGNOSIS — Z8546 Personal history of malignant neoplasm of prostate: Secondary | ICD-10-CM | POA: Diagnosis not present

## 2023-01-12 DIAGNOSIS — Z6830 Body mass index (BMI) 30.0-30.9, adult: Secondary | ICD-10-CM | POA: Diagnosis not present

## 2023-01-12 DIAGNOSIS — E669 Obesity, unspecified: Secondary | ICD-10-CM | POA: Diagnosis not present

## 2023-01-12 DIAGNOSIS — I2510 Atherosclerotic heart disease of native coronary artery without angina pectoris: Secondary | ICD-10-CM | POA: Diagnosis not present

## 2023-01-12 DIAGNOSIS — Z008 Encounter for other general examination: Secondary | ICD-10-CM | POA: Diagnosis not present

## 2023-01-12 DIAGNOSIS — I77810 Thoracic aortic ectasia: Secondary | ICD-10-CM | POA: Diagnosis not present

## 2023-01-12 DIAGNOSIS — Z8673 Personal history of transient ischemic attack (TIA), and cerebral infarction without residual deficits: Secondary | ICD-10-CM | POA: Diagnosis not present

## 2023-01-12 DIAGNOSIS — F321 Major depressive disorder, single episode, moderate: Secondary | ICD-10-CM | POA: Diagnosis not present

## 2023-01-12 DIAGNOSIS — R7303 Prediabetes: Secondary | ICD-10-CM | POA: Diagnosis not present

## 2023-01-12 DIAGNOSIS — F1020 Alcohol dependence, uncomplicated: Secondary | ICD-10-CM | POA: Diagnosis not present

## 2023-01-12 DIAGNOSIS — E785 Hyperlipidemia, unspecified: Secondary | ICD-10-CM | POA: Diagnosis not present

## 2023-01-16 DIAGNOSIS — R202 Paresthesia of skin: Secondary | ICD-10-CM | POA: Diagnosis not present

## 2023-01-16 DIAGNOSIS — R200 Anesthesia of skin: Secondary | ICD-10-CM | POA: Diagnosis not present

## 2023-01-20 ENCOUNTER — Encounter: Payer: Self-pay | Admitting: Family Medicine

## 2023-01-20 ENCOUNTER — Ambulatory Visit (INDEPENDENT_AMBULATORY_CARE_PROVIDER_SITE_OTHER): Payer: No Typology Code available for payment source | Admitting: Family Medicine

## 2023-01-20 VITALS — BP 139/86 | HR 69 | Ht 68.0 in | Wt 208.3 lb

## 2023-01-20 DIAGNOSIS — R0989 Other specified symptoms and signs involving the circulatory and respiratory systems: Secondary | ICD-10-CM | POA: Diagnosis not present

## 2023-01-20 DIAGNOSIS — H6123 Impacted cerumen, bilateral: Secondary | ICD-10-CM | POA: Diagnosis not present

## 2023-01-20 LAB — POCT INFLUENZA A/B
Influenza A, POC: NEGATIVE
Influenza B, POC: NEGATIVE

## 2023-01-20 LAB — POC COVID19 BINAXNOW: SARS Coronavirus 2 Ag: NEGATIVE

## 2023-01-20 MED ORDER — AMOXICILLIN 875 MG PO TABS: 875.0000 mg | ORAL_TABLET | Freq: Two times a day (BID) | ORAL | 0 refills | Status: AC

## 2023-01-20 NOTE — Progress Notes (Signed)
Established patient visit   Patient: Cameron Foster.   DOB: [birth year]   82 y.o. Male  MRN: [id number] Visit Date: 01/20/2023  Today's healthcare provider: Jacky Kindle, FNP  Introduced to nurse practitioner role and practice setting.  All questions answered.  Discussed provider/patient relationship and expectations.  Subjective    HPI  Cameron Foster presents with "sinus problems" request for COVID and FLU testing.  The patient, with a history of neuropathy, presents with acute sinus congestion. He woke up with the symptoms, describing a feeling of pressure in his sinuses. He denies having a fever. The patient reports a recent exposure to a person who had recently recovered from COVID-19. He also mentions a pending sleep test and an upcoming appointment with a neurologist for his neuropathy.  Medications: Outpatient Medications Prior to Visit  Medication Sig   amLODipine (NORVASC) 5 MG tablet TAKE 1 TABLET BY MOUTH EVERY DAY   aspirin 81 MG chewable tablet Chew 1 tablet (81 mg total) by mouth 2 (two) times daily.   atorvastatin (LIPITOR) 40 MG tablet TAKE 1 TABLET BY MOUTH EVERY DAY   docusate sodium (COLACE) 100 MG capsule Take 1 capsule (100 mg total) by mouth 2 (two) times daily. (Patient taking differently: Take 200 mg by mouth daily.)   fluticasone (FLONASE) 50 MCG/ACT nasal spray PLACE 1 SPRAY INTO BOTH NOSTRILS DAILY AS NEEDED FOR ALLERGIES OR RHINITIS.   gabapentin (NEURONTIN) 100 MG capsule Take 1 capsule (100 mg total) by mouth 3 (three) times daily.   hydrocortisone 2.5 % cream APPLY TO AFFECTED AREA TWICE A DAY AS NEEDED FOR RASH   ibuprofen (ADVIL) 200 MG tablet Take 400 mg by mouth every 6 (six) hours as needed.   lisinopril-hydrochlorothiazide (ZESTORETIC) 10-12.5 MG tablet Take 1 tablet by mouth daily.   metoprolol tartrate (LOPRESSOR) 100 MG tablet TAKE 1 TABLET BY MOUTH TWICE A DAY   Multiple Vitamin (MULTIVITAMIN WITH MINERALS) TABS tablet Take 1 tablet by mouth daily.    omeprazole (PRILOSEC) 20 MG capsule Take 1 capsule (20 mg total) by mouth daily. Reports currently taking as needed   sertraline (ZOLOFT) 100 MG tablet Take 1.5 tablets (150 mg total) by mouth daily.   vitamin B-12 (CYANOCOBALAMIN) 1000 MCG tablet Take 1,000 mcg by mouth daily.   No facility-administered medications prior to visit.     Objective    There were no vitals taken for this visit.  Physical Exam Vitals and nursing note reviewed.  Constitutional:      General: He is not in acute distress.    Appearance: Normal appearance. He is obese. He is not ill-appearing, toxic-appearing or diaphoretic.  HENT:     Head: Normocephalic and atraumatic.     Right Ear: Tympanic membrane normal. There is impacted cerumen.     Left Ear: Tympanic membrane normal. There is impacted cerumen.     Ears:     Comments: Normal TM following cerumen removal; pt tolerated procedure well.    Nose: Congestion present.     Mouth/Throat:     Pharynx: No oropharyngeal exudate or posterior oropharyngeal erythema.  Eyes:     Conjunctiva/sclera: Conjunctivae normal.  Cardiovascular:     Rate and Rhythm: Normal rate and regular rhythm.     Pulses: Normal pulses.     Heart sounds: Normal heart sounds.  Pulmonary:     Effort: Pulmonary effort is normal.     Breath sounds: Normal breath sounds.  Musculoskeletal:  General: Normal range of motion.     Cervical back: Normal range of motion and neck supple. No rigidity or tenderness.  Lymphadenopathy:     Cervical: No cervical adenopathy.  Skin:    General: Skin is warm and dry.     Capillary Refill: Capillary refill takes less than 2 seconds.  Neurological:     General: No focal deficit present.     Mental Status: He is alert and oriented to person, place, and time. Mental status is at baseline.     Procedure: Cerumen removal Description: Cerumen removed from both ears using a curette. Large chunks of impacted cerumen extracted, improving  hearing.    Assessment & Plan    Acute Sinus complaints; patient c/f Sinusitis Presented with acute onset of sinus congestion and pressure. No fever. COVID-19 and influenza tests were negative. -Supportive care with Mucinex and Robitussin. -Printed prescription for antibiotics to be filled if symptoms persist or worsen by Monday 01/23/2023.  Cerumen Impaction Bilateral cerumen impaction causing discomfort and potential balance issues. -Performed bilateral ear irrigation and removal of cerumen.  Neuropathy Patient reports upcoming neurology appointment for suspected neuropathy. -No changes to current management. -Continues to walk with cane.  Sleep Apnea Patient reports not receiving home sleep study equipment as previously ordered. -Message sent to ordering provider to follow up on the status of the sleep study equipment.  Follow-up Patient to monitor symptoms and fill antibiotic prescription if needed. Patient to follow up with neurology for suspected neuropathy and with primary care provider regarding sleep study equipment.  Leilani Merl, FNP, have reviewed all documentation for this visit. The documentation on 01/20/23 for the exam, diagnosis, procedures, and orders are all accurate and complete.  Jacky Kindle, FNP  [facility] [phone] (phone) [phone] (fax)  [facility]

## 2023-01-24 DIAGNOSIS — R2689 Other abnormalities of gait and mobility: Secondary | ICD-10-CM | POA: Diagnosis not present

## 2023-01-24 DIAGNOSIS — G5793 Unspecified mononeuropathy of bilateral lower limbs: Secondary | ICD-10-CM | POA: Diagnosis not present

## 2023-02-13 ENCOUNTER — Other Ambulatory Visit: Payer: No Typology Code available for payment source

## 2023-02-13 NOTE — Patient Instructions (Signed)
Thank you for allowing the Care Management team to participate in your care.  It was great speaking with you today!

## 2023-02-13 NOTE — Patient Outreach (Unsigned)
Care Management   Visit Note  02/13/2023 Name: Cameron Foster. MRN: [id number] DOB: 1941-01-21  Subjective: Cameron Foster. is a 82 y.o. year old male who is a primary care patient of Bacigalupo, Marzella Schlein, MD. The Care Management team was consulted for assistance.      Engaged with patient via telephone.  Assessment:  Outpatient Encounter Medications as of 02/13/2023  Medication Sig Note   amLODipine (NORVASC) 5 MG tablet TAKE 1 TABLET BY MOUTH EVERY DAY    aspirin 81 MG chewable tablet Chew 1 tablet (81 mg total) by mouth 2 (two) times daily.    atorvastatin (LIPITOR) 40 MG tablet TAKE 1 TABLET BY MOUTH EVERY DAY    docusate sodium (COLACE) 100 MG capsule Take 1 capsule (100 mg total) by mouth 2 (two) times daily. (Patient taking differently: Take 200 mg by mouth daily.) 02/13/2023: Using Equate Brand   hydrocortisone 2.5 % cream APPLY TO AFFECTED AREA TWICE A DAY AS NEEDED FOR RASH    lisinopril-hydrochlorothiazide (ZESTORETIC) 10-12.5 MG tablet Take 1 tablet by mouth daily. 04/14/2022: Reports dose was changed. Currently taking 25 mg daily   metoprolol tartrate (LOPRESSOR) 100 MG tablet TAKE 1 TABLET BY MOUTH TWICE A DAY    Multiple Vitamin (MULTIVITAMIN WITH MINERALS) TABS tablet Take 1 tablet by mouth daily.    omeprazole (PRILOSEC) 20 MG capsule Take 1 capsule (20 mg total) by mouth daily. Reports currently taking as needed    sertraline (ZOLOFT) 100 MG tablet Take 1.5 tablets (150 mg total) by mouth daily. 02/13/2023: Reports dose was decreased. Currently taking 100 mg daily   vitamin B-12 (CYANOCOBALAMIN) 1000 MCG tablet Take 1,000 mcg by mouth daily.    fluticasone (FLONASE) 50 MCG/ACT nasal spray PLACE 1 SPRAY INTO BOTH NOSTRILS DAILY AS NEEDED FOR ALLERGIES OR RHINITIS. 02/13/2023: Reports needing order   gabapentin (NEURONTIN) 100 MG capsule Take 1 capsule (100 mg total) by mouth 3 (three) times daily. (Patient not taking: Reported on 01/20/2023)    ibuprofen (ADVIL) 200  MG tablet Take 400 mg by mouth every 6 (six) hours as needed.    No facility-administered encounter medications on file as of 02/13/2023.    Interventions:

## 2023-02-14 ENCOUNTER — Other Ambulatory Visit: Payer: Self-pay

## 2023-02-14 DIAGNOSIS — G4730 Sleep apnea, unspecified: Secondary | ICD-10-CM | POA: Diagnosis not present

## 2023-02-15 ENCOUNTER — Other Ambulatory Visit: Payer: No Typology Code available for payment source | Admitting: Pharmacist

## 2023-02-15 NOTE — Progress Notes (Signed)
02/15/2023 Name: Cameron Foster. MRN: [id number] DOB: 1940/05/05  Chief Complaint  Patient presents with   Medication Management   Hypertension    Shuan Kubick. is a 82 y.o. year old male who presented for a telephone visit.   They were referred to the pharmacist by  [facility] prior  for assistance in managing hypertension.    Subjective:  Care Team: Primary Care Provider: Erasmo Downer, MD ; Next Scheduled Visit: 03/27/23 Clinical Pharmacist: Marlowe Aschoff, PharmD  Medication Access/Adherence  Current Pharmacy:  [facility] [phone] [place] Rough, [place] - [address] [place] [address] Phone: [phone] Fax: [phone]   Patient reports affordability concerns with their medications: No  Patient reports access/transportation concerns to their pharmacy: No  Patient reports adherence concerns with their medications:  No     Hypertension:  Current medications: Amlodipine 5mg , Lisinopril-hydrochlorothiazide 10-12.5mg  Medications previously tried: Unknown  Patient has a validated, automated, upper arm home BP cuff Current blood pressure readings readings: 135-140/65 on average  Patient denies hypotensive s/sx including dizziness, lightheadedness.  Patient denies hypertensive symptoms including headache, chest pain, shortness of breath   Objective:  Lab Results  Component Value Date   HGBA1C 5.9 (H) 09/08/2022    Lab Results  Component Value Date   CREATININE 1.33 (H) 09/08/2022   BUN 15 09/08/2022   NA 138 09/08/2022   K 4.1 09/08/2022   CL 101 09/08/2022   CO2 26 09/08/2022    Lab Results  Component Value Date   CHOL 145 09/08/2022   HDL 48 09/08/2022   LDLCALC 69 09/08/2022   TRIG 168 (H) 09/08/2022   CHOLHDL 3.0 09/08/2022    Medications Reviewed Today     Reviewed by Pollie Friar, RPH (Pharmacist) on 02/15/23 at 1545  Med List Status: <None>   Medication Order Taking? Sig Documenting Provider Last  Dose Status Informant  Alpha-Lipoic Acid 600 MG TABS [id number] Yes Take 1 tablet by mouth daily. [provider] Taking Active   amLODipine (NORVASC) 5 MG tablet [id number] Yes TAKE 1 TABLET BY MOUTH EVERY DAY Bacigalupo, Marzella Schlein, MD Taking Active   aspirin 81 MG chewable tablet [id number] Yes Chew 1 tablet (81 mg total) by mouth 2 (two) times daily. Altamese Cabal, PA-C Taking Active   atorvastatin (LIPITOR) 40 MG tablet [id number] Yes TAKE 1 TABLET BY MOUTH EVERY DAY Bacigalupo, Marzella Schlein, MD Taking Active   Cholecalciferol (VITAMIN D3) 50 MCG (2000 UT) CAPS [id number] Yes Take 1 capsule by mouth daily. [provider] Taking Active   docusate sodium (COLACE) 100 MG capsule [id number] Yes Take 1 capsule (100 mg total) by mouth 2 (two) times daily.  Patient taking differently: Take 200 mg by mouth daily.   Altamese Cabal, PA-C Taking Active            Med Note Theophilus Kinds Feb 13, 2023  2:16 PM) Using [employer] Brand  fluticasone [facility]) 50 MCG/ACT nasal spray [id number] Yes PLACE 1 SPRAY INTO BOTH NOSTRILS DAILY AS NEEDED FOR ALLERGIES OR RHINITIS. Erasmo Downer, MD Taking Active            Med Note Lorenso Courier, Vanessa Barbara Feb 15, 2023  3:24 PM)    hydrocortisone 2.5 % cream [id number] Yes APPLY TO AFFECTED AREA TWICE A DAY AS NEEDED FOR RASH Caro Laroche, DO Taking Active   ibuprofen (ADVIL) 200 MG tablet [id number] Yes Take 400 mg by mouth every  6 (six) hours as needed. [provider] Taking Active   lisinopril-hydrochlorothiazide (ZESTORETIC) 10-12.5 MG tablet [id number] Yes Take 1 tablet by mouth daily. Malva Limes, MD Taking Active            Med Note Lorenso Courier, Vanessa Barbara Feb 15, 2023  3:22 PM)    metoprolol tartrate (LOPRESSOR) 100 MG tablet [id number] Yes TAKE 1 TABLET BY MOUTH TWICE A DAY Bacigalupo, Marzella Schlein, MD Taking Active   Multiple Vitamin (MULTIVITAMIN WITH MINERALS) TABS tablet [id number] Yes Take 1 tablet by mouth daily. Altamese Dilling, MD Taking Active Self  omeprazole (PRILOSEC) 20 MG capsule [id number] Yes Take 1 capsule (20 mg total) by mouth daily. Reports currently taking as needed Erasmo Downer, MD Taking Active   sertraline (ZOLOFT) 100 MG tablet [id number] Yes Take 1.5 tablets (150 mg total) by mouth daily. Erasmo Downer, MD Taking Active            Med Note Theophilus Kinds Feb 13, 2023  2:14 PM) Reports dose was decreased. Currently taking 100 mg daily  thiamine (VITAMIN B1) 100 MG tablet [id number] Yes Take 100 mg by mouth daily. [provider] Taking Active   vitamin B-12 (CYANOCOBALAMIN) 1000 MCG tablet [id number] Yes Take 1,000 mcg by mouth daily. [provider] Taking Active Self              Assessment/Plan:   Hypertension: - Currently controlled - Reviewed long term cardiovascular and renal outcomes of uncontrolled blood pressure - Reviewed appropriate blood pressure monitoring technique and reviewed goal blood pressure. 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 and to replace the Eliquis due to lack of Afib issues + fall risk bleeding concerns + TIA history inf 2020   **Follow Up Plan:**  - No follow-up call necessary- provided phone number through MyChart message if patient would like to reach out with more questions in the future - Completed full medication review on the phone today and confirmed he was taking the correct medications - Called Neurology regarding PT- said it takes longer to process with his insurance and will hopefully be calling him Thursday or Friday this week - Discussed Gabapentin for neuropathy- is NOT taking - Discussed ways to discard old medications appropriately   Marlowe Aschoff, PharmD [facility] Phone Number: [phone]

## 2023-02-15 NOTE — Patient Outreach (Signed)
Care Management   Visit Note   Name: Cameron Foster. MRN: [id number] DOB: 01/08/1941  Subjective: Cameron Foster. is a 82 y.o. year old male who is a primary care patient of Bacigalupo, Marzella Schlein, MD. The Care Management team was consulted for assistance.      Engaged with patient via telephone to discuss update received from the Neurology team.    Goals Addressed             This Visit's Progress    Care Management       Current Barriers:  Chronic Disease Management support and education needs related to Hypertension, Heart Failure and Atrial Fibrillation  Planned Interventions: Reviewed current treatment plans established by primary care and specialty providers. Reviewed medications and indications for use. Attempting to take medications as prescribed but reports occasionally missing doses d/t waking up later in the day. He is not currently using a pillbox. Reports keeping pill bottles in a basket which he feels is effective. Primary concern is sleeping late and missing morning doses. Reviewed options for medication management. He has tried using the alarm on his mobile phone but reports it he usually sleeps through it. Discussed options for an electric or battery-operated alarm clock.  He is agreeable with this plan. Agreed to call and update if he requires assistance with setting up the alarm device. Reminded of outreach with the clinic Pharmacist on 02/15/23. Provided information regarding risks of taking aspirin and ibuprofen. He has a history of gastric ulcer with hemorrhage. He is agreeable to trying a tylenol based medication for pain relief instead of Advil. Reviewed established blood pressure parameters along with indications for notifying a provider. Reports not monitoring daily but checking routinely. Reports home readings have been good. Advised to monitor at least 3 times a week if not monitoring daily. Advised to write readings and keep a log to identify trends.   Reviewed weight parameters and need to notify a provider for weight gain greater than 3 lbs overnight or weight gain greater than 5 lbs within a week. Reports weighing every other day. Denies overnight or weekly weight gain exceeding parameters. Most recent weight was 204.5 lbs.  Reviewed symptoms. Denies chest pain or palpitations. Denies headaches, dizziness, or visual changes. He reports shortness of breath with prolonged activity and when bending over. Denies episodes at rest. Denies abdominal swelling. Reports swelling to lower extremities which he attributes to a previous ankle injury. He occasionally wears compression hose.  Reviewed safety and fall prevention measures. Denies recent falls. Reports following recommended precautions. Reports using a cane when ambulating outside of the home. He has a Occupational hygienist. Reports completing all ADLs independently however he does require assistance with household tasks and housecleaning. Reports having private pay assistance for housekeeping once a month. He is interested in Physical Therapy services. Recalls Neurology team planning to place order for home health PT. RNCM will follow up with the Neurology team regarding plan.  Discussed nutritional intake. Reports receiving Meals on Wheels and ordering takeout. Discussed options for in-home assistance with light meal preparation. Will reach out to SW regarding additional options. Reports he has not been given a fluid restriction. Encouraged to read nutrition labels, monitoring sodium intake, limit cholesterol and avoid highly processed foods when possible. Reviewed s/sx of heart attack, stroke and worsening symptoms that require immediate medical attention. Screening for signs and symptoms of depression related to chronic disease state. He reports symptoms of sadness d/t loss of his wife  in April. Reports completing grief counseling with [facility]. Declines current need for additional counseling but  agreed to update RNCM if this changes. Reviewed s/sx of heart attack, stroke and worsening symptoms that require immediate medical attention. Update 02/14/23: Follow up regarding discussion with Neurology team. Confirmed that referral was submitted for [facility] home [facility] PT however, start of services is pending approval by Cameron Foster's insurance company/[employer]. Cameron Foster is aware that other agencies will be considered if the referral is not approved. He expressed interest in starting services with [facility] if the current referral is not approved. Reports receiving information about this agency from a neighbor that's currently receiving therapy. Reviewed safety and fall prevention measures. Reviewed worsening s/sx and indications for seeking medical attention.  BP Readings from Last 3 Encounters:  01/20/23 139/86  12/26/22 131/78  12/21/22 124/82    Lab Results  Component Value Date   CHOL 145 09/08/2022   HDL 48 09/08/2022   LDLCALC 69 09/08/2022   TRIG 168 (H) 09/08/2022   CHOLHDL 3.0 09/08/2022    Wt Readings from Last 3 Encounters:  01/20/23 208 lb 4.8 oz (94.5 kg)  12/21/22 207 lb 4.8 oz (94 kg)  10/24/22 211 lb 11.2 oz (96 kg)     Patient Goals/Self-Care: Take medications as prescribed   Attend all scheduled provider appointments Call pharmacy for medication refills 3-7 days in advance of running out of medications Check blood pressure daily Keep a blood pressure log Check pulse Continue monitoring weights and call for weight gain greater than 3 lbs overnight or 5 lbs within a week Eat more whole grains, fruits and vegetables, lean meats and healthy fats Limit intake of sodium and cholesterol Follow recommended fall and safety precautions  Call provider office for new concerns or questions         PLAN Will follow up within the next week.   Katina Degree [facility]  [facility], [facility] RN Care Manager Direct Dial:  [phone] Website: [email or web address]

## 2023-02-16 DIAGNOSIS — M5442 Lumbago with sciatica, left side: Secondary | ICD-10-CM | POA: Diagnosis not present

## 2023-02-16 DIAGNOSIS — M5416 Radiculopathy, lumbar region: Secondary | ICD-10-CM | POA: Diagnosis not present

## 2023-02-16 DIAGNOSIS — G8929 Other chronic pain: Secondary | ICD-10-CM | POA: Diagnosis not present

## 2023-02-17 ENCOUNTER — Other Ambulatory Visit: Payer: Self-pay | Admitting: Family Medicine

## 2023-02-17 ENCOUNTER — Other Ambulatory Visit: Payer: No Typology Code available for payment source

## 2023-02-17 DIAGNOSIS — F32A Depression, unspecified: Secondary | ICD-10-CM | POA: Diagnosis not present

## 2023-02-17 DIAGNOSIS — I480 Paroxysmal atrial fibrillation: Secondary | ICD-10-CM | POA: Diagnosis not present

## 2023-02-17 DIAGNOSIS — M48061 Spinal stenosis, lumbar region without neurogenic claudication: Secondary | ICD-10-CM | POA: Diagnosis not present

## 2023-02-17 DIAGNOSIS — F801 Expressive language disorder: Secondary | ICD-10-CM | POA: Diagnosis not present

## 2023-02-17 DIAGNOSIS — M5416 Radiculopathy, lumbar region: Secondary | ICD-10-CM | POA: Diagnosis not present

## 2023-02-17 DIAGNOSIS — G608 Other hereditary and idiopathic neuropathies: Secondary | ICD-10-CM | POA: Diagnosis not present

## 2023-02-17 DIAGNOSIS — G3184 Mild cognitive impairment, so stated: Secondary | ICD-10-CM | POA: Diagnosis not present

## 2023-02-17 DIAGNOSIS — R2689 Other abnormalities of gait and mobility: Secondary | ICD-10-CM | POA: Diagnosis not present

## 2023-02-17 DIAGNOSIS — Z8673 Personal history of transient ischemic attack (TIA), and cerebral infarction without residual deficits: Secondary | ICD-10-CM | POA: Diagnosis not present

## 2023-02-17 DIAGNOSIS — H532 Diplopia: Secondary | ICD-10-CM | POA: Diagnosis not present

## 2023-02-20 NOTE — Patient Outreach (Addendum)
 Care Management   Visit Note   Name: Cameron Foster. MRN: [id number] DOB: [birth year]  Subjective: Cameron Foster. is a 82 y.o. year old male who is a primary care patient of Bacigalupo, Marzella Schlein, MD. The Care Management team was consulted for assistance.      Engaged with patient via telephone.  Assessment:  Outpatient Encounter Medications as of 02/17/2023  Medication Sig Note   Alpha-Lipoic Acid 600 MG TABS Take 1 tablet by mouth daily.    amLODipine (NORVASC) 5 MG tablet TAKE 1 TABLET BY MOUTH EVERY DAY    aspirin 81 MG chewable tablet Chew 1 tablet (81 mg total) by mouth 2 (two) times daily.    atorvastatin (LIPITOR) 40 MG tablet TAKE 1 TABLET BY MOUTH EVERY DAY    Cholecalciferol (VITAMIN D3) 50 MCG (2000 UT) CAPS Take 1 capsule by mouth daily.    docusate sodium (COLACE) 100 MG capsule Take 1 capsule (100 mg total) by mouth 2 (two) times daily. (Patient taking differently: Take 200 mg by mouth daily.) 02/13/2023: Using Equate Brand   fluticasone (FLONASE) 50 MCG/ACT nasal spray PLACE 1 SPRAY INTO BOTH NOSTRILS DAILY AS NEEDED FOR ALLERGIES OR RHINITIS.    hydrocortisone 2.5 % cream APPLY TO AFFECTED AREA TWICE A DAY AS NEEDED FOR RASH    ibuprofen (ADVIL) 200 MG tablet Take 400 mg by mouth every 6 (six) hours as needed.    lisinopril-hydrochlorothiazide (ZESTORETIC) 10-12.5 MG tablet Take 1 tablet by mouth daily.    metoprolol tartrate (LOPRESSOR) 100 MG tablet TAKE 1 TABLET BY MOUTH TWICE A DAY    Multiple Vitamin (MULTIVITAMIN WITH MINERALS) TABS tablet Take 1 tablet by mouth daily.    omeprazole (PRILOSEC) 20 MG capsule Take 1 capsule (20 mg total) by mouth daily. Reports currently taking as needed    sertraline (ZOLOFT) 100 MG tablet Take 1.5 tablets (150 mg total) by mouth daily. 02/13/2023: Reports dose was decreased. Currently taking 100 mg daily   thiamine (VITAMIN B1) 100 MG tablet Take 100 mg by mouth daily.    vitamin B-12 (CYANOCOBALAMIN) 1000 MCG tablet Take  1,000 mcg by mouth daily.    No facility-administered encounter medications on file as of 02/17/2023.   Interventions:   Goals Addressed             This Visit's Progress    Care Management       Current Barriers:  Chronic Disease Management support and education needs related to Hypertension, Heart Failure and Atrial Fibrillation  Planned Interventions: Reviewed current treatment plans established by primary care and specialty providers. Reviewed medications and indications for use. Attempting to take medications as prescribed but reports occasionally missing doses d/t waking up later in the day. He is not currently using a pillbox. Reports keeping pill bottles in a basket which he feels is effective. Primary concern is sleeping late and missing morning doses. Reviewed options for medication management. He has tried using the alarm on his mobile phone but reports it he usually sleeps through it. Discussed options for an electric or battery-operated alarm clock.  He is agreeable with this plan. Agreed to call and update if he requires assistance with setting up the alarm device. Reminded of outreach with the clinic Pharmacist on 02/15/23. Provided information regarding risks of taking aspirin and ibuprofen. He has a history of gastric ulcer with hemorrhage. He is agreeable to trying a tylenol based medication for pain relief instead of Advil. Reviewed established blood pressure parameters  along with indications for Reports not monitoring daily but checking routinely. Reports home readings have been good. Advised to monitor at least 3 times a week if not monitoring daily. Advised to write readings and keep a log to identify trends.  Reviewed weight parameters and need to notify a provider for weight gain greater than 3 lbs overnight or weight gain greater than 5 lbs within a week. Reports weighing every other day. Denies overnight or weekly weight gain exceeding parameters. Most recent weight was  204.5 lbs.  Reviewed symptoms. Denies chest pain or palpitations. Denies headaches, dizziness, or visual changes. He reports shortness of breath with prolonged activity and when bending over. Denies episodes at rest. Denies abdominal swelling. Reports swelling to lower extremities which he attributes to a previous ankle injury. He occasionally wears compression hose.  Reviewed safety and fall prevention measures. Denies recent falls. Reports following recommended precautions. Reports using a cane when ambulating outside of the home. He has a Occupational hygienist. Reports completing all ADLs independently however he does require assistance with household tasks and housecleaning. Reports having private pay assistance for housekeeping once a month. He is interested in Physical Therapy services. Recalls Neurology team planning to place order for home health PT. RNCM will follow up with the Neurology team regarding plan.  Discussed nutritional intake. Reports receiving Meals on Wheels and ordering takeout. Discussed options for in-home assistance with light meal preparation. Will reach out to SW regarding additional options. Reports he has not been given a fluid restriction. Encouraged to read nutrition labels, monitoring sodium intake, limit cholesterol and avoid highly processed foods when possible. Reviewed s/sx of heart attack, stroke and worsening symptoms that require immediate medical attention. Screening for signs and symptoms of depression related to chronic disease state. He reports symptoms of sadness d/t loss of his wife in April. Reports completing grief counseling with Cameron Foster. Declines current need for additional counseling but agreed to update RNCM if this changes. Reviewed s/sx of heart attack, stroke and worsening symptoms that require immediate medical attention. Update 02/14/23: Follow up regarding discussion with Neurology team. Confirmed that referral was submitted for [facility] home health  PT however, start of services is pending approval by Mr. Cameron Foster's insurance company/[employer]. Mr. Cameron Foster is aware that other agencies will be considered if the referral is not approved. He expressed interest in starting services with [facility] if the current referral is not approved. Reports receiving information about this agency from a neighbor that's currently receiving therapy. Reviewed safety and fall prevention measures. Reviewed worsening s/sx and indications for seeking medical attention. Update 02/17/23: Follow up regarding home health services. Reports completing initial physical therapy evaluation with [facility] earlier today. Denies other concerns.   BP Readings from Last 3 Encounters:  01/20/23 139/86  12/26/22 131/78  12/21/22 124/82    Lab Results  Component Value Date   CHOL 145 09/08/2022   HDL 48 09/08/2022   LDLCALC 69 09/08/2022   TRIG 168 (H) 09/08/2022   CHOLHDL 3.0 09/08/2022    Wt Readings from Last 3 Encounters:  01/20/23 208 lb 4.8 oz (94.5 kg)  12/21/22 207 lb 4.8 oz (94 kg)  10/24/22 211 lb 11.2 oz (96 kg)     Patient Goals/Self-Care: Take medications as prescribed   Attend all scheduled provider appointments Call pharmacy for medication refills 3-7 days in advance of running out of medications Check blood pressure daily Keep a blood pressure log Check pulse Continue monitoring weights and call for  weight gain greater than 3 lbs overnight or 5 lbs within a week Eat more whole grains, fruits and vegetables, lean meats and healthy fats Limit intake of sodium and cholesterol Follow recommended fall and safety precautions  Call provider office for new concerns or questions                       PLAN Patient will call or contact clinic if additional assistance is needed   Katina Degree Health  [facility], [facility] RN Care Manager Direct Dial: [phone] Website: [email or web address]

## 2023-02-21 DIAGNOSIS — I480 Paroxysmal atrial fibrillation: Secondary | ICD-10-CM | POA: Diagnosis not present

## 2023-02-21 DIAGNOSIS — Z8673 Personal history of transient ischemic attack (TIA), and cerebral infarction without residual deficits: Secondary | ICD-10-CM | POA: Diagnosis not present

## 2023-02-21 DIAGNOSIS — F801 Expressive language disorder: Secondary | ICD-10-CM | POA: Diagnosis not present

## 2023-02-21 DIAGNOSIS — R2689 Other abnormalities of gait and mobility: Secondary | ICD-10-CM | POA: Diagnosis not present

## 2023-02-21 DIAGNOSIS — G608 Other hereditary and idiopathic neuropathies: Secondary | ICD-10-CM | POA: Diagnosis not present

## 2023-02-21 DIAGNOSIS — M48061 Spinal stenosis, lumbar region without neurogenic claudication: Secondary | ICD-10-CM | POA: Diagnosis not present

## 2023-02-21 DIAGNOSIS — G3184 Mild cognitive impairment, so stated: Secondary | ICD-10-CM | POA: Diagnosis not present

## 2023-02-21 DIAGNOSIS — H532 Diplopia: Secondary | ICD-10-CM | POA: Diagnosis not present

## 2023-02-21 DIAGNOSIS — M5416 Radiculopathy, lumbar region: Secondary | ICD-10-CM | POA: Diagnosis not present

## 2023-02-21 DIAGNOSIS — F32A Depression, unspecified: Secondary | ICD-10-CM | POA: Diagnosis not present

## 2023-02-26 ENCOUNTER — Other Ambulatory Visit: Payer: Self-pay | Admitting: Family Medicine

## 2023-02-26 DIAGNOSIS — I10 Essential (primary) hypertension: Secondary | ICD-10-CM

## 2023-02-26 DIAGNOSIS — I110 Hypertensive heart disease with heart failure: Secondary | ICD-10-CM

## 2023-02-27 ENCOUNTER — Telehealth: Payer: Self-pay | Admitting: Family Medicine

## 2023-02-27 DIAGNOSIS — I10 Essential (primary) hypertension: Secondary | ICD-10-CM

## 2023-02-27 MED ORDER — LISINOPRIL-HYDROCHLOROTHIAZIDE 10-12.5 MG PO TABS: 1.0000 | ORAL_TABLET | Freq: Every day | ORAL | 3 refills | Status: DC

## 2023-02-27 NOTE — Telephone Encounter (Signed)
[employer] [facility] faxed refill request for the following medications:    lisinopril-hydrochlorothiazide (ZESTORETIC) 10-12.5 MG tablet   Please advise

## 2023-02-27 NOTE — Telephone Encounter (Signed)
Requested Prescriptions  Pending Prescriptions Disp Refills   amLODipine (NORVASC) 5 MG tablet [Pharmacy Med Name: AMLODIPINE BESYLATE 5 MG TAB] 90 tablet 1    Sig: TAKE 1 TABLET BY MOUTH EVERY DAY     Cardiovascular: Calcium Channel Blockers 2 Passed - 02/26/2023  8:50 AM      Passed - Last BP in normal range    BP Readings from Last 1 Encounters:  01/20/23 139/86         Passed - Last Heart Rate in normal range    Pulse Readings from Last 1 Encounters:  01/20/23 69         Passed - Valid encounter within last 6 months    Recent Outpatient Visits           1 month ago Sinus symptom   [place] [facility] Merita Norton T, FNP   2 months ago Essential hypertension   [place] [facility] [place], Marzella Schlein, MD   2 months ago Facial trauma, initial encounter   [place] [facility] [place], [place], PA-C   4 months ago Lumbar radiculopathy   [place] [facility] [place], Marzella Schlein, MD   5 months ago Encounter for annual physical exam   [place] [facility] [place], Marzella Schlein, MD       Future Appointments             In 4 weeks Bacigalupo, Marzella Schlein, MD [facility]   In 2 months Willeen Niece, MD [place] [facility]             metoprolol tartrate (LOPRESSOR) 100 MG tablet [Pharmacy Med Name: METOPROLOL TARTRATE 100 MG TAB] 180 tablet 1    Sig: TAKE 1 TABLET BY MOUTH TWICE A DAY     Cardiovascular:  Beta Blockers Passed - 02/26/2023  8:50 AM      Passed - Last BP in normal range    BP Readings from Last 1 Encounters:  01/20/23 139/86         Passed - Last Heart Rate in normal range    Pulse Readings from Last 1 Encounters:  01/20/23 69         Passed - Valid encounter within last 6 months    Recent Outpatient Visits           1 month ago Sinus symptom   [facility] Jacky Kindle, FNP   2 months  ago Essential hypertension   [place] [facility] [place], Marzella Schlein, MD   2 months ago Facial trauma, initial encounter   [place] [facility] [place], [place], PA-C   4 months ago Lumbar radiculopathy   [facility] [place], Marzella Schlein, MD   5 months ago Encounter for annual physical exam   [facility] [place], Marzella Schlein, MD       Future Appointments             In 4 weeks Bacigalupo, Marzella Schlein, MD [facility], [facility]   In 2 months Willeen Niece, MD [facility]

## 2023-03-06 ENCOUNTER — Ambulatory Visit (INDEPENDENT_AMBULATORY_CARE_PROVIDER_SITE_OTHER): Payer: No Typology Code available for payment source | Admitting: Family Medicine

## 2023-03-06 VITALS — BP 138/88 | HR 76 | Resp 16 | Ht 69.0 in | Wt 209.0 lb

## 2023-03-06 DIAGNOSIS — Z23 Encounter for immunization: Secondary | ICD-10-CM | POA: Diagnosis not present

## 2023-03-06 DIAGNOSIS — B029 Zoster without complications: Secondary | ICD-10-CM | POA: Diagnosis not present

## 2023-03-06 MED ORDER — VALACYCLOVIR HCL 1 G PO TABS: 1000.0000 mg | ORAL_TABLET | Freq: Three times a day (TID) | ORAL | 0 refills | Status: DC

## 2023-03-06 NOTE — Progress Notes (Unsigned)
Established patient visit   Patient: Cameron Foster.   DOB: July 23, 1940   82 y.o. Male  MRN: [id number] Visit Date: 03/06/2023  Today's healthcare provider: Mila Merry, MD   Chief Complaint  Patient presents with   Cyst   Subjective    Discussed the use of AI scribe software for clinical note transcription with the patient, who gave verbal consent to proceed.  History of Present Illness   The patient, Cameron Foster, presented with a chief complaint of a bothersome lump or cyst on the back of his neck and lower scalp on the right side. The issue began after a haircut about 3 days ago, initially perceived as an ingrown hair due to a stinging sensation. The discomfort persisted, and the patient noticed additional bumps around the initial area and spreading around the right side of his nick. The patient received one dose of Shingrix in October of 2023.       Medications: Outpatient Medications Prior to Visit  Medication Sig   Alpha-Lipoic Acid 600 MG TABS Take 1 tablet by mouth daily.   amLODipine (NORVASC) 5 MG tablet TAKE 1 TABLET BY MOUTH EVERY DAY   aspirin 81 MG chewable tablet Chew 1 tablet (81 mg total) by mouth 2 (two) times daily.   atorvastatin (LIPITOR) 40 MG tablet TAKE 1 TABLET BY MOUTH EVERY DAY   Cholecalciferol (VITAMIN D3) 50 MCG (2000 UT) CAPS Take 1 capsule by mouth daily.   docusate sodium (COLACE) 100 MG capsule Take 1 capsule (100 mg total) by mouth 2 (two) times daily. (Patient taking differently: Take 200 mg by mouth daily.)   fluticasone (FLONASE) 50 MCG/ACT nasal spray PLACE 1 SPRAY INTO BOTH NOSTRILS DAILY AS NEEDED FOR ALLERGIES OR RHINITIS.   hydrocortisone 2.5 % cream APPLY TO AFFECTED AREA TWICE A DAY AS NEEDED FOR RASH   ibuprofen (ADVIL) 200 MG tablet Take 400 mg by mouth every 6 (six) hours as needed.   lisinopril-hydrochlorothiazide (ZESTORETIC) 10-12.5 MG tablet Take 1 tablet by mouth daily.   metoprolol tartrate (LOPRESSOR) 100 MG tablet TAKE  1 TABLET BY MOUTH TWICE A DAY   Multiple Vitamin (MULTIVITAMIN WITH MINERALS) TABS tablet Take 1 tablet by mouth daily.   omeprazole (PRILOSEC) 20 MG capsule Take 1 capsule (20 mg total) by mouth daily. Reports currently taking as needed   sertraline (ZOLOFT) 100 MG tablet Take 1.5 tablets (150 mg total) by mouth daily.   thiamine (VITAMIN B1) 100 MG tablet Take 100 mg by mouth daily.   vitamin B-12 (CYANOCOBALAMIN) 1000 MCG tablet Take 1,000 mcg by mouth daily.   No facility-administered medications prior to visit.   Review of Systems {Insert previous labs (optional):23779} {See past labs  Heme  Chem  Endocrine  Serology  Results Review (optional):1}   Objective    BP 138/88 (BP Location: Left Arm, Patient Position: Sitting, Cuff Size: Large)   Pulse 76   Resp 16   Ht 5\' 9"  (1.753 m)   Wt 209 lb (94.8 kg)   BMI 30.86 kg/m {Insert last BP/Wt (optional):23777}{See vitals history (optional):1}  Physical Exam     Assessment & Plan        Herpes Zoster (Shingles) New onset rash on the scalp with associated pain. Noted to be in a dermatomal distribution consistent with shingles. Patient had received the shingles vaccine last year, which may be mitigating the severity of the outbreak. -Start antiviral therapy immediately. -Advise against scratching the rash to prevent potential  spread of the virus. -Documented rash with a photograph for the medical record. -Needs second dose of Shingrix in a few months.   Influenza Vaccination Patient requested and is due for annual influenza vaccination. -Administer influenza vaccine today.    No follow-ups on file.      Mila Merry, MD  [facility] [phone] (phone) [phone] (fax)  [facility]

## 2023-03-09 ENCOUNTER — Telehealth: Payer: Self-pay | Admitting: Family Medicine

## 2023-03-09 NOTE — Telephone Encounter (Signed)
Please review for Dr. B 

## 2023-03-09 NOTE — Telephone Encounter (Signed)
Cameron Foster with [facility] is calling in because pt was seen on Monday for shingles and was prescribed valACYclovir (VALTREX) 1000 MG tablet [[id number]]. Pt was doing okay on the medication and then began having pain in the shoulders. Pt's sister advised pt to take Gabapentin and Cameron Foster wanted to know was that okay or was there something else the doctor recommends him to take. Please follow up with Cameron Foster. Cameron Foster says if she doesn't answer it is okay to leave a VM.

## 2023-03-09 NOTE — Telephone Encounter (Signed)
Ok to take the gabapentin as prescribed   Ronnald Ramp, MD

## 2023-03-10 ENCOUNTER — Other Ambulatory Visit: Payer: Self-pay

## 2023-03-10 ENCOUNTER — Telehealth: Payer: Self-pay | Admitting: Family Medicine

## 2023-03-10 DIAGNOSIS — G4730 Sleep apnea, unspecified: Secondary | ICD-10-CM

## 2023-03-10 DIAGNOSIS — G478 Other sleep disorders: Secondary | ICD-10-CM

## 2023-03-10 DIAGNOSIS — G4733 Obstructive sleep apnea (adult) (pediatric): Secondary | ICD-10-CM

## 2023-03-10 NOTE — Telephone Encounter (Signed)
Irving Burton advised. She verbalized understanding

## 2023-03-10 NOTE — Telephone Encounter (Signed)
Pt is calling in because he says he received a call from the office and isn't sure who it was that called him. Please follow up with pt.

## 2023-03-13 ENCOUNTER — Ambulatory Visit: Payer: Self-pay

## 2023-03-13 DIAGNOSIS — B029 Zoster without complications: Secondary | ICD-10-CM

## 2023-03-13 MED ORDER — GABAPENTIN 100 MG PO CAPS: 100.0000 mg | ORAL_CAPSULE | Freq: Three times a day (TID) | ORAL | 1 refills | Status: DC

## 2023-03-13 MED ORDER — VALACYCLOVIR HCL 1 G PO TABS
1000.0000 mg | ORAL_TABLET | Freq: Three times a day (TID) | ORAL | 0 refills | Status: AC
Start: 2023-03-13 — End: 2023-03-20

## 2023-03-13 NOTE — Addendum Note (Signed)
Addended by: Mila Merry E on: 03/13/2023 11:00 AM   Modules accepted: Orders

## 2023-03-13 NOTE — Telephone Encounter (Signed)
  Chief Complaint: Request for more Acyclovir, and gabapentin Symptoms: Shingles Frequency: seen last week Pertinent Negatives: Patient denies  Disposition: [] ED /[] Urgent Care (no appt availability in office) / [] Appointment(In office/virtual)/ []  [place] [facility]/ [] Home Care/ [] Refused Recommended Disposition /[] [place] [facility]/ [x]  Follow-up with PCP Additional Notes: Pt states that he has finished his acyclovir and shingles persist. Pt has been taking the gabapentin 2 times daily instead of 3 times. Pt still has some gabapentin remaining. Pt would like both of these medications refilled today.  Additionally pt would like provider to know that he has tried a cpap machine in the past and could not use it. It kept him awake at night. He does not want  a cpap.     Reason for Disposition  [1] Prescription refill request for ESSENTIAL medicine (i.e., likelihood of harm to patient if not taken) AND [2] triager unable to refill per department policy  Answer Assessment - Initial Assessment Questions 1. DRUG NAME: "What medicine do you need to have refilled?"     Acyclovir and gabapentin 2. REFILLS REMAINING: "How many refills are remaining?" (Note: The label on the medicine or pill bottle will show how many refills are remaining. If there are no refills remaining, then a renewal may be needed.)     No acyclovir, still has gabapentin  4. PRESCRIBING HCP: "Who prescribed it?" Reason: If prescribed by specialist, call should be referred to that group.     Dr. Sherrie Mustache 5. SYMPTOMS: "Do you have any symptoms?"     Pain  Protocols used: Medication Refill and Renewal Call-A-AH

## 2023-03-21 DIAGNOSIS — I480 Paroxysmal atrial fibrillation: Secondary | ICD-10-CM | POA: Diagnosis not present

## 2023-03-21 DIAGNOSIS — F32A Depression, unspecified: Secondary | ICD-10-CM | POA: Diagnosis not present

## 2023-03-21 DIAGNOSIS — H532 Diplopia: Secondary | ICD-10-CM | POA: Diagnosis not present

## 2023-03-21 DIAGNOSIS — F801 Expressive language disorder: Secondary | ICD-10-CM | POA: Diagnosis not present

## 2023-03-21 DIAGNOSIS — G3184 Mild cognitive impairment, so stated: Secondary | ICD-10-CM | POA: Diagnosis not present

## 2023-03-21 DIAGNOSIS — M48061 Spinal stenosis, lumbar region without neurogenic claudication: Secondary | ICD-10-CM | POA: Diagnosis not present

## 2023-03-21 DIAGNOSIS — R2689 Other abnormalities of gait and mobility: Secondary | ICD-10-CM | POA: Diagnosis not present

## 2023-03-21 DIAGNOSIS — M5416 Radiculopathy, lumbar region: Secondary | ICD-10-CM | POA: Diagnosis not present

## 2023-03-21 DIAGNOSIS — G608 Other hereditary and idiopathic neuropathies: Secondary | ICD-10-CM | POA: Diagnosis not present

## 2023-03-21 DIAGNOSIS — Z8673 Personal history of transient ischemic attack (TIA), and cerebral infarction without residual deficits: Secondary | ICD-10-CM | POA: Diagnosis not present

## 2023-03-27 ENCOUNTER — Ambulatory Visit (INDEPENDENT_AMBULATORY_CARE_PROVIDER_SITE_OTHER): Payer: No Typology Code available for payment source | Admitting: Family Medicine

## 2023-03-27 ENCOUNTER — Encounter: Payer: Self-pay | Admitting: Family Medicine

## 2023-03-27 VITALS — BP 118/78 | HR 65 | Resp 16 | Wt 212.7 lb

## 2023-03-27 DIAGNOSIS — R7303 Prediabetes: Secondary | ICD-10-CM | POA: Diagnosis not present

## 2023-03-27 DIAGNOSIS — I5022 Chronic systolic (congestive) heart failure: Secondary | ICD-10-CM | POA: Diagnosis not present

## 2023-03-27 DIAGNOSIS — Z8673 Personal history of transient ischemic attack (TIA), and cerebral infarction without residual deficits: Secondary | ICD-10-CM

## 2023-03-27 DIAGNOSIS — G4733 Obstructive sleep apnea (adult) (pediatric): Secondary | ICD-10-CM | POA: Diagnosis not present

## 2023-03-27 DIAGNOSIS — I10 Essential (primary) hypertension: Secondary | ICD-10-CM

## 2023-03-27 DIAGNOSIS — N1831 Chronic kidney disease, stage 3a: Secondary | ICD-10-CM

## 2023-03-27 DIAGNOSIS — F320 Major depressive disorder, single episode, mild: Secondary | ICD-10-CM

## 2023-03-27 DIAGNOSIS — E785 Hyperlipidemia, unspecified: Secondary | ICD-10-CM

## 2023-03-27 MED ORDER — SERTRALINE HCL 100 MG PO TABS: 100.0000 mg | ORAL_TABLET | Freq: Every day | ORAL | 1 refills | Status: DC

## 2023-03-27 NOTE — Assessment & Plan Note (Signed)
Continue grief counseling Continue zoloft at current dose

## 2023-03-27 NOTE — Progress Notes (Signed)
Established Patient Office Visit  Subjective   Patient ID: Cameron Torti., male    DOB: 11-Dec-1940  Age: 82 y.o. MRN: [id number]  Chief Complaint  Patient presents with   Medical Management of Chronic Issues    HPI  Discussed the use of AI scribe software for clinical note transcription with the patient, who gave verbal consent to proceed.  History of Present Illness   The patient, with a history of heart failure, arthritis, and a recent stroke or TIA, presents with ongoing discomfort from shingles. The patient reports that the shingles are causing significant discomfort, particularly when trying to sleep. The patient is also dealing with insurance issues related to long-term care and is seeking assistance with this.  The patient also has a history of sleep apnea and is currently using a CPAP machine. However, the patient reports difficulty using the machine, stating that after about three hours of sleep, the machine pumps up and the air blows so hard it blows out the sides. The patient has tried multiple masks but has not found a solution to this issue.  The patient is also dealing with some mobility issues, particularly with dressing and standing from a seated position. The patient has had a hip and knee replacement and reports difficulty putting on socks and needing to rock to get out of a chair.  The patient is on multiple medications for various conditions, including heart failure, stroke prevention, cholesterol management, and arthritis pain. The patient is also taking medications for the shingles and reports that the gabapentin causes drowsiness.         ROS    Objective:     BP 118/78 (BP Location: Left Arm, Patient Position: Sitting, Cuff Size: Large)   Pulse 65   Resp 16   Wt 212 lb 11.2 oz (96.5 kg)   SpO2 96%   BMI 31.41 kg/m    Physical Exam Vitals reviewed.  Constitutional:      General: He is not in acute distress.    Appearance: Normal appearance.  He is not diaphoretic.  HENT:     Head: Normocephalic and atraumatic.  Eyes:     General: No scleral icterus.    Conjunctiva/sclera: Conjunctivae normal.  Cardiovascular:     Rate and Rhythm: Normal rate and regular rhythm.     Heart sounds: Normal heart sounds. No murmur heard. Pulmonary:     Effort: Pulmonary effort is normal. No respiratory distress.     Breath sounds: Normal breath sounds. No wheezing or rhonchi.  Musculoskeletal:     Cervical back: Neck supple.     Right lower leg: No edema.     Left lower leg: No edema.  Lymphadenopathy:     Cervical: No cervical adenopathy.  Skin:    General: Skin is warm and dry.     Findings: No rash.  Neurological:     Mental Status: He is alert and oriented to person, place, and time. Mental status is at baseline.  Psychiatric:        Mood and Affect: Mood normal.        Behavior: Behavior normal.      No results found for any visits on 03/27/23.    The ASCVD Risk score (Arnett DK, et al., 2019) failed to calculate for the following reasons:   The 2019 ASCVD risk score is only valid for ages 75 to 63   The patient has a prior MI or stroke diagnosis    Assessment &  Plan:   Problem List Items Addressed This Visit       Cardiovascular and Mediastinum   Essential hypertension - Primary    Blood pressure well-controlled with amlodipine 5 mg daily and lisinopril HCTZ 10/12.5 mg daily. - Continue current antihypertensive medications      Relevant Orders   Comprehensive metabolic panel   Heart failure with mildly reduced ejection fraction (HFmrEF) (HCC)     Respiratory   OSA (obstructive sleep apnea)     Genitourinary   Stage 3a chronic kidney disease (HCC)    Chronic and stable Recheck metabolic panel Avoid nephrotoxic meds       Relevant Orders   Comprehensive metabolic panel     Other   Depression, major, single episode, mild (HCC)    Continue grief counseling Continue zoloft at current dose      Relevant  Medications   sertraline (ZOLOFT) 100 MG tablet   Hyperlipidemia LDL goal <70    On atorvastatin 40 mg daily for cholesterol management and stroke prevention. - Continue atorvastatin 40 mg daily      History of lacunar cerebrovascular accident (CVA)    On multiple medications to prevent recurrence, including atorvastatin and baby aspirin. - Continue baby aspirin daily - Continue atorvastatin 40 mg daily      Prediabetes    Aware of prediabetic status and being monitored to prevent progression to diabetes. A1c testing is due. - Order A1c test - Monitor blood sugar levels regularly      Relevant Orders   Hemoglobin A1c       Herpes Zoster (Shingles) Ongoing pain from shingles on the head. Shingles pain can persist for six months to a year and may not always resolve completely. Advised against receiving the second shingles vaccine until at least one year after the current episode to avoid exacerbating the condition. First shingles vaccine provides about 50% efficacy; second dose increases efficacy. - Continue valacyclovir and gabapentin as needed for pain - Reassess pain in future visits - Consider second shingles vaccine one year after current episode  Obstructive Sleep Apnea not currently using a CPAP machine, which is uncomfortable due to issues with air pressure and mask fit. Insurance denied coverage for a new CPAP machine despite meeting criteria. Discussed potential mask fit issues and alternative masks tried. - Consider alternative CPAP masks or settings to improve comfort  Heart Failure Requesting long-term care assistance due to heart failure. Needs assistance with dressing and occasional standby assistance for transfers due to difficulty standing. - Submit forms to insurance for long-term care assistance citing heart failure and arthritis  General Health Maintenance Due for a physical exam in six months. Discussed the importance of regular screenings and vaccinations.  Reviewed and updated medication list. - Schedule physical exam in six months - Perform kidney function test - Review and update medication list regularly  Follow-up - Attend wellness visit on February 12th - Schedule follow-up visit in six months for physical exam.      Total time spent on today's visit was greater than 40 minutes, including both face-to-face time and nonface-to-face time personally spent on review of chart (labs and imaging), discussing labs and goals, treatment options, form completion, answering patient's questions, and coordinating care.    Return in about 6 months (around 09/25/2023) for CPE.    Shirlee Latch, MD

## 2023-03-27 NOTE — Assessment & Plan Note (Signed)
On atorvastatin 40 mg daily for cholesterol management and stroke prevention. - Continue atorvastatin 40 mg daily

## 2023-03-27 NOTE — Assessment & Plan Note (Signed)
On multiple medications to prevent recurrence, including atorvastatin and baby aspirin. - Continue baby aspirin daily - Continue atorvastatin 40 mg daily

## 2023-03-27 NOTE — Assessment & Plan Note (Signed)
Blood pressure well-controlled with amlodipine 5 mg daily and lisinopril HCTZ 10/12.5 mg daily. - Continue current antihypertensive medications

## 2023-03-27 NOTE — Assessment & Plan Note (Signed)
Aware of prediabetic status and being monitored to prevent progression to diabetes. A1c testing is due. - Order A1c test - Monitor blood sugar levels regularly

## 2023-03-27 NOTE — Assessment & Plan Note (Signed)
Chronic and stable Recheck metabolic panel Avoid nephrotoxic meds  

## 2023-03-28 LAB — COMPREHENSIVE METABOLIC PANEL
ALT: 10 [IU]/L (ref 0–44)
AST: 19 [IU]/L (ref 0–40)
Albumin: 3.7 g/dL (ref 3.7–4.7)
Alkaline Phosphatase: 65 [IU]/L (ref 44–121)
BUN/Creatinine Ratio: 17 (ref 10–24)
BUN: 20 mg/dL (ref 8–27)
Bilirubin Total: 0.3 mg/dL (ref 0.0–1.2)
CO2: 24 mmol/L (ref 20–29)
Calcium: 9.3 mg/dL (ref 8.6–10.2)
Chloride: 103 mmol/L (ref 96–106)
Creatinine, Ser: 1.18 mg/dL (ref 0.76–1.27)
Globulin, Total: 2.6 g/dL (ref 1.5–4.5)
Glucose: 84 mg/dL (ref 70–99)
Potassium: 4 mmol/L (ref 3.5–5.2)
Sodium: 140 mmol/L (ref 134–144)
Total Protein: 6.3 g/dL (ref 6.0–8.5)
eGFR: 62 mL/min/{1.73_m2} (ref >59)

## 2023-03-28 LAB — HEMOGLOBIN A1C
Est. average glucose Bld gHb Est-mCnc: 114 mg/dL
Hgb A1c MFr Bld: 5.6 % (ref 4.8–5.6)

## 2023-04-04 ENCOUNTER — Telehealth: Payer: Self-pay | Admitting: Family Medicine

## 2023-04-04 DIAGNOSIS — I10 Essential (primary) hypertension: Secondary | ICD-10-CM

## 2023-04-04 NOTE — Telephone Encounter (Signed)
[facility] is requesting prescription refill lisinopril-hydrochlorothiazide (ZESTORETIC) 10-12.5 MG tablet   Please advise

## 2023-04-05 NOTE — Telephone Encounter (Signed)
Rx not sent  [facility] 02/27/23 #90 3RF Patient requesting too soon

## 2023-05-02 ENCOUNTER — Ambulatory Visit: Payer: No Typology Code available for payment source | Admitting: Dermatology

## 2023-05-02 ENCOUNTER — Encounter: Payer: Self-pay | Admitting: Dermatology

## 2023-05-02 DIAGNOSIS — C44319 Basal cell carcinoma of skin of other parts of face: Secondary | ICD-10-CM | POA: Diagnosis not present

## 2023-05-02 DIAGNOSIS — D492 Neoplasm of unspecified behavior of bone, soft tissue, and skin: Secondary | ICD-10-CM

## 2023-05-02 DIAGNOSIS — L820 Inflamed seborrheic keratosis: Secondary | ICD-10-CM | POA: Diagnosis not present

## 2023-05-02 DIAGNOSIS — L578 Other skin changes due to chronic exposure to nonionizing radiation: Secondary | ICD-10-CM | POA: Diagnosis not present

## 2023-05-02 DIAGNOSIS — L570 Actinic keratosis: Secondary | ICD-10-CM | POA: Diagnosis not present

## 2023-05-02 DIAGNOSIS — B079 Viral wart, unspecified: Secondary | ICD-10-CM

## 2023-05-02 DIAGNOSIS — L304 Erythema intertrigo: Secondary | ICD-10-CM | POA: Diagnosis not present

## 2023-05-02 DIAGNOSIS — W098XXA Fall on or from other playground equipment, initial encounter: Secondary | ICD-10-CM

## 2023-05-02 DIAGNOSIS — C44310 Basal cell carcinoma of skin of unspecified parts of face: Secondary | ICD-10-CM

## 2023-05-02 MED ORDER — KETOCONAZOLE 2 % EX CREA: TOPICAL_CREAM | CUTANEOUS | 5 refills | Status: DC

## 2023-05-02 NOTE — Patient Instructions (Addendum)
 Wound Care Instructions  Cleanse wound gently with soap and water  once a day then pat dry with clean gauze. Apply a thin coat of Petrolatum (petroleum jelly, Vaseline) over the wound (unless you have an allergy to this). We recommend that you use a new, sterile tube of Vaseline. Do not pick or remove scabs. Do not remove the yellow or Nile healing tissue from the base of the wound.  Cover the wound with fresh, clean, nonstick gauze and secure with paper tape. You may use Band-Aids in place of gauze and tape if the wound is small enough, but would recommend trimming much of the tape off as there is often too much. Sometimes Band-Aids can irritate the skin.  You should call the office for your biopsy report after 1 week if you have not already been contacted.  If you experience any problems, such as abnormal amounts of bleeding, swelling, significant bruising, significant pain, or evidence of infection, please call the office immediately.  FOR ADULT SURGERY PATIENTS: If you need something for pain relief you may take 1 extra strength Tylenol  (acetaminophen ) AND 2 Ibuprofen (200mg  each) together every 4 hours as needed for pain. (do not take these if you are allergic to them or if you have a reason you should not take them.) Typically, you may only need pain medication for 1 to 3 days.    Start Ketoconazole  2 5% cream apply once or twice daily as needed for rash.  Use Hydrocortisone  2.5% cream once or twice daily to affected areas for itching up to 2 weeks as needed.   Recommend OTC Zeasorb AF powder to body folds daily after shower.  It is often found in the athlete's foot section in the pharmacy.  Avoid using powders that contain cornstarch.  For Hyperhidrosis (Excess Sweating) - Recommend antiperspirant, for example: trial of Odaban spray or SweatBlock wipes start twice per week. Use more often if needed to stop sweating.  Use less often (once per week) if causing irritation.  Other OTC  topical antiperspirants to try include Carpe and Certain Dri.      Cryotherapy Aftercare  Wash gently with soap and water  everyday.   Apply Vaseline and Band-Aid daily until healed.     Recommend daily broad spectrum sunscreen SPF 30+ to sun-exposed areas, reapply every 2 hours as needed. Call for new or changing lesions.  Staying in the shade or wearing long sleeves, sun glasses (UVA+UVB protection) and wide brim hats (4-inch brim around the entire circumference of the hat) are also recommended for sun protection.    Due to recent changes in healthcare laws, you may see results of your pathology and/or laboratory studies on MyChart before the doctors have had a chance to review them. We understand that in some cases there may be results that are confusing or concerning to you. Please understand that not all results are received at the same time and often the doctors may need to interpret multiple results in order to provide you with the best plan of care or course of treatment. Therefore, we ask that you please give us  2 business days to thoroughly review all your results before contacting the office for clarification. Should we see a critical lab result, you will be contacted sooner.   If You Need Anything After Your Visit  If you have any questions or concerns for your doctor, please call our main line at [phone] and press option 4 to reach your doctor's medical assistant. If no one answers,  please leave a voicemail as directed and we will return your call as soon as possible. Messages left after 4 pm will be answered the following business day.   You may also send us  a message via MyChart. We typically respond to MyChart messages within 1-2 business days.  For prescription refills, please ask your pharmacy to contact our office. Our fax number is [phone].  If you have an urgent issue when the clinic is closed that cannot wait until the next business day, you can page your  doctor at the number below.    Please note that while we do our best to be available for urgent issues outside of office hours, we are not available 24/7.   If you have an urgent issue and are unable to reach us , you may choose to seek medical care at your doctor's office, retail clinic, urgent care center, or emergency room.  If you have a medical emergency, please immediately call 911 or go to the emergency department.  Pager Numbers  - Dr. Hester: [phone]  - Dr. Jackquline: [phone]  - Dr. Claudene: [phone]   In the event of inclement weather, please call our main line at [phone] for an update on the status of any delays or closures.  Dermatology Medication Tips: Please keep the boxes that topical medications come in in order to help keep track of the instructions about where and how to use these. Pharmacies typically print the medication instructions only on the boxes and not directly on the medication tubes.   If your medication is too expensive, please contact our office at [phone] option 4 or send us  a message through MyChart.   We are unable to tell what your co-pay for medications will be in advance as this is different depending on your insurance coverage. However, we may be able to find a substitute medication at lower cost or fill out paperwork to get insurance to cover a needed medication.   If a prior authorization is required to get your medication covered by your insurance company, please allow us  1-2 business days to complete this process.  Drug prices often vary depending on where the prescription is filled and some pharmacies may offer cheaper prices.  The website www.goodrx.com contains coupons for medications through different pharmacies. The prices here do not account for what the cost may be with help from insurance (it may be cheaper with your insurance), but the website can give you the price if you did not use any insurance.  - You can print  the associated coupon and take it with your prescription to the pharmacy.  - You may also stop by our office during regular business hours and pick up a GoodRx coupon card.  - If you need your prescription sent electronically to a different pharmacy, notify our office through [place] or by phone at [phone] option 4.     Si Usted Necesita Algo Despus de Su Visita  Tambin puede enviarnos un mensaje a travs de Clinical Cytogeneticist. Por lo general respondemos a los mensajes de MyChart en el transcurso de 1 a 2 das hbiles.  Para renovar recetas, por favor pida a su farmacia que se ponga en contacto con nuestra oficina. Randi lakes de fax es [place] [phone].  Si tiene un asunto urgente cuando la clnica est cerrada y que no puede esperar hasta el siguiente da hbil, puede llamar/localizar a su doctor(a) al nmero que aparece a continuacin.   Por favor, tenga en cuenta que  aunque hacemos todo lo posible para estar disponibles para asuntos urgentes fuera del horario de oficina, no estamos disponibles las 24 horas del da, los 7 [address]  [address] de la [place].   Si tiene un problema urgente y no puede comunicarse con nosotros, puede optar por buscar atencin mdica  en el consultorio de su doctor(a), en una clnica privada, en un centro de atencin urgente o en una sala de emergencias.  Si tiene engineer, drilling, por favor llame inmediatamente al 911 o vaya a la sala de emergencias.  Nmeros de bper  - Dr. Hester: [phone]  - Dra. Jackquline: [phone]  - Dr. Claudene: [phone]   En caso de inclemencias del tiempo, por favor llame a landry capes principal al [phone] para una actualizacin sobre el [place] de cualquier retraso o cierre.  Consejos para la medicacin en dermatologa: Por favor, guarde las cajas en las que vienen los medicamentos de uso tpico para ayudarle a seguir las instrucciones sobre dnde y cmo usarlos. Las farmacias generalmente imprimen las  instrucciones del medicamento slo en las cajas y no directamente en los tubos del [place].   Si su medicamento es muy caro, por favor, pngase en contacto con landry rieger llamando al [phone] y presione la opcin 4 o envenos un mensaje a travs de Clinical Cytogeneticist.   No podemos decirle cul ser su copago por los medicamentos por adelantado ya que esto es diferente dependiendo de la cobertura de su seguro. Sin embargo, es posible que podamos encontrar un medicamento sustituto a audiological scientist un formulario para que el seguro cubra el medicamento que se considera necesario.   Si se requiere una autorizacin previa para que su compaa de seguros cubra su medicamento, por favor permtanos de 1 a 2 das hbiles para completar este proceso.  Los precios de los medicamentos varan con frecuencia dependiendo del environmental consultant de dnde se surte la receta y alguna farmacias pueden ofrecer precios ms baratos.  El sitio web www.goodrx.com tiene cupones para medicamentos de health and safety inspector. Los precios aqu no tienen en cuenta lo que podra costar con la ayuda del seguro (puede ser ms barato con su seguro), pero el sitio web puede darle el precio si no utiliz tourist information centre manager.  - Puede imprimir el cupn correspondiente y llevarlo con su receta a la farmacia.  - Tambin puede pasar por nuestra oficina durante el horario de atencin regular y education officer, museum una tarjeta de cupones de GoodRx.  - Si necesita que su receta se enve electrnicamente a una farmacia diferente, informe a nuestra oficina a travs de MyChart de [place] o por telfono llamando al [phone] y presione la opcin 4.

## 2023-05-02 NOTE — Progress Notes (Signed)
 Follow-Up Visit   Subjective  Cameron Foster. is a 83 y.o. male who presents for the following: 6 month AK follow up. Face, ears, left hand. Tx with LN2 10/12/2022.  Recheck BCC. Left post auricular. Bx/Tx with [facility] 10/12/2022. Healing well.   Check spot at right upper arm. Growing. Irritated by clothing.   C/O rash in groin. Sweats. Inflamed, itching.   The patient has spots, moles and lesions to be evaluated, some may be new or changing and the patient may have concern these could be cancer. Spot in left sideburn area gets itchy and irritated   The following portions of the chart were reviewed this encounter and updated as appropriate: medications, allergies, medical history  Review of Systems:  No other skin or systemic complaints except as noted in HPI or Assessment and Plan.  Objective  Well appearing patient in no apparent distress; mood and affect are within normal limits.  A focused examination was performed of the following areas: Face, ears, neck, arms, hands  Relevant exam findings are noted in the Assessment and Plan.  Right Upper Inner Arm - Anterior 1 cm erythematous keratotic horn  Right Medial Cheek 0.9 cm pink pearly papule  left sideburn area x1 Erythematous keratotic or waxy stuck-on papule Left Ear Helix x2, L preauricular x1, R sideburn x2, L index finger x1, R forearm x1 (7) Erythematous hyperkeratotic papules.   Assessment & Plan   INTERTRIGO Exam: Patient defers exam today  Chronic and persistent condition with duration or expected duration over one year. Condition is bothersome/symptomatic for patient. Currently flared.   Intertrigo is a chronic recurrent rash that occurs in skin fold areas that may be associated with friction; heat; moisture; yeast; fungus; and bacteria.  It is exacerbated by increased movement / activity; sweating; and higher atmospheric temperature.  Use of an absorbant powder such as Zeasorb AF powder or other OTC antifungal  powder to the area daily can prevent rash recurrence. Other options to help keep the area dry include blow drying the area after bathing or using antiperspirant products such as Duradry sweat minimizing gel.  Treatment Plan: Start Ketoconazole  2 5% cream apply once or twice daily as needed for rash.  Use Hydrocortisone  2.5% cream (pt has) once or twice daily to affected areas for itching up to 2 weeks as needed.   Recommend OTC Zeasorb AF powder to body folds daily after shower.  It is often found in the athlete's foot section in the pharmacy.  Avoid using powders that contain cornstarch.  ACTINIC DAMAGE - chronic, secondary to cumulative UV radiation exposure/sun exposure over time - diffuse scaly erythematous macules with underlying dyspigmentation - Recommend daily broad spectrum sunscreen SPF 30+ to sun-exposed areas, reapply every 2 hours as needed.  - Recommend staying in the shade or wearing long sleeves, sun glasses (UVA+UVB protection) and wide brim hats (4-inch brim around the entire circumference of the hat). - Call for new or changing lesions.   NEOPLASM OF SKIN (2) Right Upper Inner Arm - Anterior Skin / nail biopsy Type of biopsy: tangential   Informed consent: discussed and consent obtained   Anesthesia: the lesion was anesthetized in a standard fashion   Anesthesia comment:  Area prepped with alcohol Anesthetic:  1% lidocaine  w/ epinephrine  1-100,000 buffered w/ 8.4% NaHCO3 Instrument used: flexible razor blade   Hemostasis achieved with: pressure, aluminum chloride and electrodesiccation   Outcome: patient tolerated procedure well    Destruction of lesion  Destruction method: electrodesiccation  and curettage   Curettage performed in three different directions: Yes   Electrodesiccation performed over the curetted area: Yes   Final wound size (cm):  1 Hemostasis achieved with:  pressure, aluminum chloride and electrodesiccation Outcome: patient tolerated procedure  well with no complications   Post-procedure details: wound care instructions given   Additional details:  Mupirocin  ointment and Bandaid applied  Specimen 1 - Surgical pathology Differential Diagnosis: Hypertrophic AK with cutaneous horn, R/O SCC  Check Margins: No  EDC Right Medial Cheek Skin / nail biopsy Type of biopsy: tangential   Informed consent: discussed and consent obtained   Anesthesia: the lesion was anesthetized in a standard fashion   Anesthesia comment:  Area prepped with alcohol Anesthetic:  1% lidocaine  w/ epinephrine  1-100,000 buffered w/ 8.4% NaHCO3 Instrument used: flexible razor blade   Hemostasis achieved with: pressure, aluminum chloride and electrodesiccation   Outcome: patient tolerated procedure well    Destruction of lesion  Destruction method: electrodesiccation and curettage   Curettage performed in three different directions: Yes   Electrodesiccation performed over the curetted area: Yes   Final wound size (cm):  1.4 Hemostasis achieved with:  pressure, aluminum chloride and electrodesiccation Outcome: patient tolerated procedure well with no complications   Post-procedure details: wound care instructions given   Additional details:  Mupirocin  ointment and Bandaid applied  Specimen 2 - Surgical pathology Differential Diagnosis: R/O BCC  Check Margins: No  EDC INFLAMED SEBORRHEIC KERATOSIS left sideburn area x1 Symptomatic, irritating, patient would like treated. Destruction of lesion - left sideburn area x1  Destruction method: cryotherapy   Informed consent: discussed and consent obtained   Lesion destroyed using liquid nitrogen: Yes   Region frozen until ice ball extended beyond lesion: Yes   Outcome: patient tolerated procedure well with no complications   Post-procedure details: wound care instructions given   Additional details:  Prior to procedure, discussed risks of blister formation, small wound, skin dyspigmentation, or rare scar  following cryotherapy. Recommend Vaseline ointment to treated areas while healing.  HYPERTROPHIC ACTINIC KERATOSIS (7) Left Ear Helix x2, L preauricular x1, R sideburn x2, L index finger x1, R forearm x1 (7) Actinic keratoses are precancerous spots that appear secondary to cumulative UV radiation exposure/sun exposure over time. They are chronic with expected duration over 1 year. A portion of actinic keratoses will progress to squamous cell carcinoma of the skin. It is not possible to reliably predict which spots will progress to skin cancer and so treatment is recommended to prevent development of skin cancer.  Recommend daily broad spectrum sunscreen SPF 30+ to sun-exposed areas, reapply every 2 hours as needed.  Recommend staying in the shade or wearing long sleeves, sun glasses (UVA+UVB protection) and wide brim hats (4-inch brim around the entire circumference of the hat). Call for new or changing lesions. Destruction of lesion - Left Ear Helix x2, L preauricular x1, R sideburn x2, L index finger x1, R forearm x1 (7)  Destruction method: cryotherapy   Informed consent: discussed and consent obtained   Lesion destroyed using liquid nitrogen: Yes   Region frozen until ice ball extended beyond lesion: Yes   Outcome: patient tolerated procedure well with no complications   Post-procedure details: wound care instructions given   Additional details:  Prior to procedure, discussed risks of blister formation, small wound, skin dyspigmentation, or rare scar following cryotherapy. Recommend Vaseline ointment to treated areas while healing.  INTERTRIGO   ACTINIC SKIN DAMAGE    Return for AK Follow Up  in 3-4 months, TBSE.  I, Kate Fought, CMA, am acting as scribe for Rexene Rattler, MD.   Documentation: I have reviewed the above documentation for accuracy and completeness, and I agree with the above.  Rexene Rattler, MD

## 2023-05-05 LAB — SURGICAL PATHOLOGY

## 2023-05-08 ENCOUNTER — Telehealth: Payer: Self-pay

## 2023-05-08 NOTE — Telephone Encounter (Signed)
-----   Message from Willeen Niece sent at 05/08/2023  1:45 PM EST ----- 1. Skin, right upper inner arm - anterior :       VERRUCA VULGARIS, IRRITATED   2. Skin, right medial cheek :       BASAL CELL CARCINOMA, NODULAR PATTERN   1. Benign wart, already treated with EDC at time of biopsy 2. BCC skin cancer- already treated with EDC at time of biopsy   - please call patient

## 2023-05-08 NOTE — Telephone Encounter (Signed)
Advised patient of biopsy results.

## 2023-05-09 ENCOUNTER — Other Ambulatory Visit: Payer: Self-pay | Admitting: Family Medicine

## 2023-05-09 DIAGNOSIS — I10 Essential (primary) hypertension: Secondary | ICD-10-CM

## 2023-05-09 NOTE — Telephone Encounter (Signed)
Requested Prescriptions  Refused Prescriptions Disp Refills   amLODipine (NORVASC) 5 MG tablet [Pharmacy Med Name: AMLODIPINE BESYLATE 5 MG TAB] 90 tablet 1    Sig: TAKE 1 TABLET BY MOUTH EVERY DAY     Cardiovascular: Calcium Channel Blockers 2 Passed - 05/09/2023  4:20 PM      Passed - Last BP in normal range    BP Readings from Last 1 Encounters:  03/27/23 118/78         Passed - Last Heart Rate in normal range    Pulse Readings from Last 1 Encounters:  03/27/23 65         Passed - Valid encounter within last 6 months    Recent Outpatient Visits           1 month ago Essential hypertension   [facility] [place], Marzella Schlein, MD   2 months ago Herpes zoster without complication   [facility] Malva Limes, MD   3 months ago Sinus symptom   [facility] Merita Norton T, FNP   4 months ago Essential hypertension   [facility] [place], Marzella Schlein, MD   4 months ago Facial trauma, initial encounter   [facility] [place], [place], [place]

## 2023-05-24 ENCOUNTER — Encounter: Payer: Self-pay | Admitting: Emergency Medicine

## 2023-05-25 NOTE — Progress Notes (Signed)
 Cardiology Clinic Note   Date: 05/26/2023 ID: Cameron Trinka., DOB [birth year], MRN [id number]  Primary Cardiologist:  Lonni Hanson, MD  Chief Complaint   Cameron Loftus. is a 83 y.o. male who presents to the clinic today for routine follow up.   Patient Profile   Cameron Mayabb. is followed by Dr. Hanson for the history outlined below.      Past medical history significant for: Chronic HFmrEF. Echo 04/27/2022: EF 55 to 60%.  No RWMA.  Grade II DD.  Normal global strain.  Normal RV size/function.  Trivial AI. PAF. 14-day ZIO 08/13/2021: HR 50 to 102 bpm, average 63 bpm.  1 episode of NSVT lasting 5 beats max rate 160 bpm.  78 atrial runs lasting up to 15 beats with max rate 171 bpm.  No sustained arrhythmia including A-fib/flutter or prolonged pauses.  Rare PACs/PVCs. Hypertension. Hyperlipidemia. Lipid panel 09/08/2022: LDL 69, HDL 48, TG 168, total 145. OSA. CVA. Subarachnoid hemorrhage. CKD stage IIIa. Tobacco use. Colitis with GI bleeding. Prostate cancer. Alcohol use.  In summary, patient was admitted to the hospital in March 2023 with left-sided colitis felt to be ischemic versus infectious.  Flexible sigmoidoscopy noted possible ischemic colitis.  He was treated with antibiotics.  His admission was complicated by hypotension and A-fib with RVR.  High-sensitivity troponin 18 with a delta troponin of 14.  BNP 569.  Echo demonstrated EF 45 to 50%, global hypokinesis, moderate concentric LVH, elevated LVEDP, normal RV systolic size/function, mild LAE, mild MR, mild calcification of aortic valve without stenosis, mildly dilated aortic root and ascending aorta measuring 41 and 39 mm respectively.  He was placed on IV amiodarone  for rate control which led to pharmacologic cardioversion.  He was started on Eliquis .  He was seen in the office on 07/22/2021 for hospital follow-up.  He denied any further GI bleeding.  He expressed concerns for continuing AC secondary to bleeding  problems.  He underwent outpatient cardiac monitoring which showed no A-fib as detailed above and Eliquis  was discontinued.    Patient was last seen in the office by Dr. Hanson on 05/04/2022 for routine follow-up.  His repeat echo prior to visit showed EF 55 to 60% as detailed above.  Patient inquired about starting Cialis  and was encouraged to discuss this with urology.     History of Present Illness    Today, patient is here alone. He shares that his wife passed away in 03-May-2024and things have been rough for him. He did attend grief counseling and felt it helped. Patient denies shortness of breath, dyspnea on exertion, orthopnea or PND. He has chronic right lower extremity edema for years secondary to an old injury. No left lower extremity edema. No chest pain, pressure, or tightness. No palpitations.  He is not very active but is now performing all household duties. He continues to use dip tobacco and drinks 1-2 shots of whiskey or 1-2 glasses of wine a night. Patient inquires about the use of Cialis . He wants to be sure it is safe for him to use. Per Dr. Ulysses last office note in January 2024 there are no cardiac contraindications for the use of PDE5i. Patient will discuss this with his PCP.       ROS: All other systems reviewed and are otherwise negative except as noted in History of Present Illness.  EKGs/Labs Reviewed    EKG Interpretation Date/Time:  Friday May 26 2023 15:47:23 EST Ventricular Rate:  26  PR Interval:  194 QRS Duration:  132 QT Interval:  450 QTC Calculation: 475 R Axis:   -72  Text Interpretation: Normal sinus rhythm Left axis deviation Right bundle branch block Minimal voltage criteria for LVH, may be normal variant ( R in aVL ) Septal infarct , age undetermined When compared with ECG of 27-Jun-2021 05:02, Sinus rhythm has replaced Atrial fibrillation Vent. rate has decreased BY  34 BPM ST no longer depressed in Anterior leads T wave inversion no longer evident  in Anterolateral leads Confirmed by Cameron Foster [phone]) on 05/26/2023 3:58:32 PM   03/27/2023: ALT 10; AST 19; BUN 20; Creatinine, Ser 1.18; Potassium 4.0; Sodium 140   09/08/2022: Hemoglobin 12.2; WBC 7.7           Physical Exam    VS:  BP 120/80 (BP Location: Left Arm, Patient Position: Sitting, Cuff Size: Normal)   Pulse 67   Ht 5' 9 (1.753 m)   Wt 208 lb 8 oz (94.6 kg)   SpO2 97%   BMI 30.79 kg/m  , BMI Body mass index is 30.79 kg/m.  GEN: Well nourished, well developed, in no acute distress. Neck: No JVD or carotid bruits. Cardiac:  RRR. No murmurs. No rubs or gallops.   Respiratory:  Respirations regular and unlabored. Clear to auscultation without rales, wheezing or rhonchi. GI: Soft, nontender, nondistended. Extremities: Radials/DP/PT 2+ and equal bilaterally. No clubbing or cyanosis. No edema left lower extremity. Mild nonpitting right lower extremity edema.   Skin: Warm and dry, no rash. Neuro: Strength intact.  Assessment & Plan   HFimpEF Patient with mildly decreased LV function in the setting of colitis with GI bleeding March 2023.  Echo January 2024 showed recovered LV function with EF 55 to 60%, no RWMA, Grade II DD, normal RV size/function.  Patient denies shortness of breath, DOE, orthopnea or PND. He is using a CPAP and has been trying to get the right mask fit. He has chronic right lower extremity edema from an old injury and no left lower extremity edema. Euvolemic and well compensated on exam. Mild nonpitting edema right lower extremity.  -Continue lisinopril -HCTZ, metoprolol .  PAF 1 episode of A-fib in the setting of colitis with GI bleeding.  14-day ZIO April 2023 showed no episodes of A-fib.  Anticoagulation was discontinued and patient resumed aspirin .  Given history of GI bleeding we will continue to defer use of anticoagulation.  Patient denies palpitations. EKG today shows NSR.  -Continue metoprolol .  Hypertension BP today 120/80. No report of  headaches or dizziness.  -Continue amlodipine , lisinopril -HCTZ, metoprolol .  Hyperlipidemia LDL May 2024 69, at goal. -Continue atorvastatin . -Followed by PCP.  Tobacco abuse/alcohol use Patient uses dip tobacco. He drinks 1-2 shots of whiskey or 1-2 glasses of wine a night.  -Cessation encouraged.   Disposition: Return in 1 year or sooner as needed.          Signed, Foster [name]. Lenya Sterne, DNP, NP-C

## 2023-05-26 ENCOUNTER — Ambulatory Visit: Payer: No Typology Code available for payment source | Attending: Student | Admitting: Student

## 2023-05-26 ENCOUNTER — Encounter: Payer: Self-pay | Admitting: Student

## 2023-05-26 VITALS — BP 120/80 | HR 67 | Ht 69.0 in | Wt 208.5 lb

## 2023-05-26 DIAGNOSIS — Z789 Other specified health status: Secondary | ICD-10-CM | POA: Diagnosis not present

## 2023-05-26 DIAGNOSIS — I10 Essential (primary) hypertension: Secondary | ICD-10-CM | POA: Diagnosis not present

## 2023-05-26 DIAGNOSIS — I519 Heart disease, unspecified: Secondary | ICD-10-CM

## 2023-05-26 DIAGNOSIS — I480 Paroxysmal atrial fibrillation: Secondary | ICD-10-CM | POA: Diagnosis not present

## 2023-05-26 DIAGNOSIS — I5032 Chronic diastolic (congestive) heart failure: Secondary | ICD-10-CM | POA: Diagnosis not present

## 2023-05-26 DIAGNOSIS — E785 Hyperlipidemia, unspecified: Secondary | ICD-10-CM

## 2023-05-26 DIAGNOSIS — Z720 Tobacco use: Secondary | ICD-10-CM | POA: Diagnosis not present

## 2023-05-26 NOTE — Patient Instructions (Signed)
 Medication Instructions:  Your Physician recommend you continue on your current medication as directed.   *If you need a refill on your cardiac medications before your next appointment, please call your pharmacy*   Lab Work: None ordered at this time  If you have labs (blood work) drawn today and your tests are completely normal, you will receive your results only by: MyChart Message (if you have MyChart) OR A paper copy in the mail If you have any lab test that is abnormal or we need to change your treatment, we will call you to review the results.   Testing/Procedures: None ordered at this time    Follow-Up: At [facility], you and your health needs are our priority.  As part of our continuing mission to provide you with exceptional heart care, we have created designated Provider Care Teams.  These Care Teams include your primary Cardiologist (physician) and Advanced Practice Providers (APPs -  Physician Assistants and Nurse Practitioners) who all work together to provide you with the care you need, when you need it.  We recommend signing up for the patient portal called MyChart.  Sign up information is provided on this After Visit Summary.  MyChart is used to connect with patients for Virtual Visits (Telemedicine).  Patients are able to view lab/test results, encounter notes, upcoming appointments, etc.  Non-urgent messages can be sent to your provider as well.   To learn more about what you can do with MyChart, go to [email or web address].    Your next appointment:   1 year(s)  Provider:   You may see Lonni Hanson, MD or one of the following Advanced Practice Providers on your designated Care Team:   Lonni Meager, NP Bernardino Bring, PA-C Cadence Franchester, PA-C Tylene Lunch, NP Barnie Hila, NP

## 2023-05-29 ENCOUNTER — Encounter (INDEPENDENT_AMBULATORY_CARE_PROVIDER_SITE_OTHER): Payer: No Typology Code available for payment source | Admitting: Family Medicine

## 2023-05-29 DIAGNOSIS — Z8619 Personal history of other infectious and parasitic diseases: Secondary | ICD-10-CM | POA: Diagnosis not present

## 2023-05-29 DIAGNOSIS — Z7185 Encounter for immunization safety counseling: Secondary | ICD-10-CM | POA: Diagnosis not present

## 2023-05-29 DIAGNOSIS — N5235 Erectile dysfunction following radiation therapy: Secondary | ICD-10-CM

## 2023-05-31 ENCOUNTER — Ambulatory Visit (INDEPENDENT_AMBULATORY_CARE_PROVIDER_SITE_OTHER): Payer: No Typology Code available for payment source | Admitting: Emergency Medicine

## 2023-05-31 VITALS — Ht 69.0 in | Wt 200.0 lb

## 2023-05-31 DIAGNOSIS — Z0000 Encounter for general adult medical examination without abnormal findings: Secondary | ICD-10-CM | POA: Diagnosis not present

## 2023-05-31 NOTE — Patient Instructions (Addendum)
Cameron Foster , Thank you for taking time to come for your Medicare Wellness Visit. I appreciate your ongoing commitment to your health goals. Please review the following plan we discussed and let me know if I can assist you in the future.   Referrals/Orders/Follow-Ups/Clinician Recommendations: Keep the 2nd shingles vaccine at your earliest convenience.  This is a list of the screening recommended for you and due dates:  Health Maintenance  Topic Date Due   Zoster (Shingles) Vaccine (2 of 2) 03/21/2022   COVID-19 Vaccine (6 - 2024-25 season) 12/18/2022   Medicare Annual Wellness Visit  05/30/2024   DTaP/Tdap/Td vaccine (2 - Td or Tdap) 01/13/2030   Pneumonia Vaccine  Completed   Flu Shot  Completed   HPV Vaccine  Aged Out   Colon Cancer Screening  Discontinued    Advanced directives: (In Chart) A copy of your advanced directives are scanned into your chart should your provider ever need it.  Next Medicare Annual Wellness Visit scheduled for next year: Yes, 06/05/24 @ 2:30pm (phone visit)  Fall Prevention in the Home, Adult Falls can cause injuries and affect people of all ages. There are many simple things that you can do to make your home safe and to help prevent falls. If you need it, ask for help making these changes. What actions can I take to prevent falls? General information Use good lighting in all rooms. Make sure to: Replace any light bulbs that burn out. Turn on lights if it is dark and use night-lights. Keep items that you use often in easy-to-reach places. Lower the shelves around your home if needed. Move furniture so that there are clear paths around it. Do not keep throw rugs or other things on the floor that can make you trip. If any of your floors are uneven, fix them. Add color or contrast paint or tape to clearly mark and help you see: Grab bars or handrails. First and last steps of staircases. Where the edge of each step is. If you use a ladder or  stepladder: Make sure that it is fully opened. Do not climb a closed ladder. Make sure the sides of the ladder are locked in place. Have someone hold the ladder while you use it. Know where your pets are as you move through your home. What can I do in the bathroom?     Keep the floor dry. Clean up any water that is on the floor right away. Remove soap buildup in the bathtub or shower. Buildup makes bathtubs and showers slippery. Use non-skid mats or decals on the floor of the bathtub or shower. Attach bath mats securely with double-sided, non-slip rug tape. If you need to sit down while you are in the shower, use a non-slip stool. Install grab bars by the toilet and in the bathtub and shower. Do not use towel bars as grab bars. What can I do in the bedroom? Make sure that you have a light by your bed that is easy to reach. Do not use any sheets or blankets on your bed that hang to the floor. Have a firm bench or chair with side arms that you can use for support when you get dressed. What can I do in the kitchen? Clean up any spills right away. If you need to reach something above you, use a sturdy step stool that has a grab bar. Keep electrical cables out of the way. Do not use floor polish or wax that makes floors slippery. What  can I do with my stairs? Do not leave anything on the stairs. Make sure that you have a light switch at the top and the bottom of the stairs. Have them installed if you do not have them. Make sure that there are handrails on both sides of the stairs. Fix handrails that are broken or loose. Make sure that handrails are as long as the staircases. Install non-slip stair treads on all stairs in your home if they do not have carpet. Avoid having throw rugs at the top or bottom of stairs, or secure the rugs with carpet tape to prevent them from moving. Choose a carpet design that does not hide the edge of steps on the stairs. Make sure that carpet is firmly attached  to the stairs. Fix any carpet that is loose or worn. What can I do on the outside of my home? Use bright outdoor lighting. Repair the edges of walkways and driveways and fix any cracks. Clear paths of anything that can make you trip, such as tools or rocks. Add color or contrast paint or tape to clearly mark and help you see high doorway thresholds. Trim any bushes or trees on the main path into your home. Check that handrails are securely fastened and in good repair. Both sides of all steps should have handrails. Install guardrails along the edges of any raised decks or porches. Have leaves, snow, and ice cleared regularly. Use sand, salt, or ice melt on walkways during winter months if you live where there is ice and snow. In the garage, clean up any spills right away, including grease or oil spills. What other actions can I take? Review your medicines with your health care provider. Some medicines can make you confused or feel dizzy. This can increase your chance of falling. Wear closed-toe shoes that fit well and support your feet. Wear shoes that have rubber soles and low heels. Use a cane, walker, scooter, or crutches that help you move around if needed. Talk with your provider about other ways that you can decrease your risk of falls. This may include seeing a physical therapist to learn to do exercises to improve movement and strength. Where to find more information Centers for Disease Control and Prevention, STEAD[employer]: TonerPromos.no [employer] on Aging: BaseRingTones.pl National Institute on Aging: BaseRingTones.pl Contact a health care provider if: You are afraid of falling at home. You feel weak, drowsy, or dizzy at home. You fall at home. Get help right away if you: Lose consciousness or have trouble moving after a fall. Have a fall that causes a head injury. These symptoms may be an emergency. Get help right away. Call 911. Do not wait to see if the symptoms will go away. Do not drive  yourself to the hospital. This information is not intended to replace advice given to you by your health care provider. Make sure you discuss any questions you have with your health care provider. Document Revised: 12/06/2021 Document Reviewed: 12/06/2021 Elsevier Patient Education  2024 [employer].

## 2023-05-31 NOTE — Progress Notes (Signed)
 Subjective:   Cameron Foster. is a 83 y.o. male who presents for Medicare Annual/Subsequent preventive examination.  This patient declined Interactive audio and Acupuncturist. Therefore the visit was completed with audio only.   Visit Complete: Virtual I connected with  Cameron Foster. on 05/31/23 by a audio enabled telemedicine application and verified that I am speaking with the correct person using two identifiers.  Patient Location: Home  Provider Location: Home Office  I discussed the limitations of evaluation and management by telemedicine. The patient expressed understanding and agreed to proceed.  Vital Signs: Because this visit was a virtual/telehealth visit, some criteria may be missing or patient reported. Any vitals not documented were not able to be obtained and vitals that have been documented are patient reported.  Patient Medicare AWV questionnaire was completed by the patient on 05/29/23; I have confirmed that all information answered by patient is correct and no changes since this date.  Cardiac Risk Factors include: advanced age (>16men, >18 women);hypertension;dyslipidemia;Other (see comment), Risk factor comments: OSA (cpap), prediabetic     Objective:    Today's Vitals   05/31/23 1421  Weight: 200 lb (90.7 kg)  Height: 5\' 9"  (1.753 m)   Body mass index is 29.53 kg/m.     05/31/2023    2:38 PM 05/25/2022    2:12 PM 09/27/2021    5:22 PM 09/21/2021    2:38 PM 07/02/2021    8:00 PM 06/24/2021    9:10 AM 06/23/2021   11:00 AM  Advanced Directives  Does Patient Have a Medical Advance Directive? Yes Yes Yes Yes Yes Yes Yes  Type of Estate agent of [place];Living will Healthcare Power of [place];Living will Healthcare Power of [place];Living will Healthcare Power of [place];Living will   Healthcare Power of Attorney  Does patient want to make changes to medical advance directive? No - Patient declined  No - Patient declined   No - Patient declined  No - Patient declined  Copy of Healthcare Power of Attorney in Chart? Yes - validated most recent copy scanned in chart (See row information) Yes - validated most recent copy scanned in chart (See row information)     Yes - validated most recent copy scanned in chart (See row information)    Current Medications (verified) Outpatient Encounter Medications as of 05/31/2023  Medication Sig   Alpha-Lipoic Acid 600 MG TABS Take 1 tablet by mouth daily.   amLODipine (NORVASC) 5 MG tablet TAKE 1 TABLET BY MOUTH EVERY DAY   aspirin 81 MG chewable tablet Chew 1 tablet (81 mg total) by mouth 2 (two) times daily.   atorvastatin (LIPITOR) 40 MG tablet TAKE 1 TABLET BY MOUTH EVERY DAY   docusate sodium (COLACE) 100 MG capsule Take 1 capsule (100 mg total) by mouth 2 (two) times daily. (Patient taking differently: Take 200 mg by mouth daily.)   fluticasone (FLONASE) 50 MCG/ACT nasal spray PLACE 1 SPRAY INTO BOTH NOSTRILS DAILY AS NEEDED FOR ALLERGIES OR RHINITIS.   hydrocortisone 2.5 % cream APPLY TO AFFECTED AREA TWICE A DAY AS NEEDED FOR RASH   ketoconazole (NIZORAL) 2 % cream Apply once or twice daily to affected skin folds as needed for rash   lisinopril-hydrochlorothiazide (ZESTORETIC) 10-12.5 MG tablet Take 1 tablet by mouth daily.   metoprolol tartrate (LOPRESSOR) 100 MG tablet TAKE 1 TABLET BY MOUTH TWICE A DAY   Multiple Vitamin (MULTIVITAMIN WITH MINERALS) TABS tablet Take 1 tablet by mouth daily.   omeprazole (  PRILOSEC) 20 MG capsule Take 1 capsule (20 mg total) by mouth daily. Reports currently taking as needed   sertraline (ZOLOFT) 100 MG tablet Take 1 tablet (100 mg total) by mouth daily.   thiamine (VITAMIN B1) 100 MG tablet Take 100 mg by mouth daily.   vitamin B-12 (CYANOCOBALAMIN) 1000 MCG tablet Take 1,000 mcg by mouth daily.   No facility-administered encounter medications on file as of 05/31/2023.    Allergies (verified) Formaldehyde   History: Past Medical  History:  Diagnosis Date   Alcoholism (HCC)    Aortic atherosclerosis (HCC)    Arthritis    Ascending aorta dilation (HCC) 03/28/2018   a.) Vascular US: prox asc Ao measured 29 mm. b.)  CT CAP 06/22/2021: Ao root 41 mm. c.) TTE 06/26/2021: Ao root 41 mm; asc Ao 39 mm   Atrial fibrillation (HCC) 1995   a.) single episode in 1995 per patient; no long term treatment. b.) recurrent episode in the setting of GI bleeding related to colitis 06/2021.   Basal cell carcinoma 04/26/2017   Right medial cheek. Superficial and nodular   Basal cell carcinoma 06/11/2019   Left anterior shoulder. Nodular pattern   Basal cell carcinoma 09/09/2019   Right nasal ala, [facility]   Basal cell carcinoma 02/05/2020   L upper eyebrow, [facility]    Basal cell carcinoma 10/12/2022   left post auricular, [facility]   Basal cell carcinoma 05/02/2023   Right medial cheek, [facility]   Bilateral carotid artery stenosis 06/09/2019   a.) Carotid doppler: mod; <50% BILATERAL ICAs.   Chicken pox    Colon cancer (HCC)    Colon polyp    Depression    Son died 2014-06-21   Diverticulitis    Diverticulosis 30 years   Gastritis    GERD (gastroesophageal reflux disease)    H. pylori infection    History of stress test    a. 09/2015 MV: No ischemia/infarct. EF 45-54% (nl by echo).   Hyperlipidemia    Hypertension    Irritable bowel syndrome    Lacunar infarction (HCC) 06/08/2019   a.) small; RIGHT motor strip   NSVT (nonsustained ventricular tachycardia) (HCC)    a.)  Single episode lasting 5 beats at a maximum rate of 160 bpm noted on Holter study performed 08/13/2021.   Prostate cancer [facility]) June 21, 2010   a.) s/p XRT   PSVT (paroxysmal supraventricular tachycardia) (HCC)    a. 06/2019 Zio: Avg rate 74 (54-120), occas PACs, rare PVCs, 125 episodes of PVCs (longest 17.5 secs; max rate 187). No afib.   RBBB    SAH (subarachnoid hemorrhage) (HCC) 10/10/2018   Squamous cell carcinoma of skin 07/18/2017   Left medial calf. KA type   Squamous cell  carcinoma of skin 04/24/2018   Right above med. brow   Squamous cell carcinoma of skin 06/11/2019   Right posterior shoulder. SCCis, hypertrophic   Squamous cell carcinoma of skin 01/09/2020   Mid nasal dorsum, MOHS, Efudex x 4wks   Systolic dysfunction    a.) TTE 10/05/2015: EF 50-55%; mild LVH; LAE; triv AR, mild MR. b.) TTE 03/29/2018: EF 55-60%; LAE, mild AR; ? small PFO. c.) TTE 06/09/2019: EF 55-60%, no rwma, triv MR/AI. d.) TTE 06/26/2021: EF 45-50%; glob HK; LAE; mild MR; Ao root 41 mm; asc Ao 39 mm.   Vasovagal syncope    Past Surgical History:  Procedure Laterality Date   CARDIAC CATHETERIZATION  06-21-1996   [place] no stents   CATARACT EXTRACTION, BILATERAL  COLON RESECTION SIGMOID N/A 12/07/2016   Procedure: COLON RESECTION SIGMOID;  Surgeon: Ricarda Frame, MD;  Location: [facility] ORS;  Service: General;  Laterality: N/A;   COLON SURGERY  11/2016   Colostomy   COLONOSCOPY  2016   COLONOSCOPY WITH PROPOFOL N/A 05/30/2016   Procedure: COLONOSCOPY WITH PROPOFOL;  Surgeon: Christena Deem, MD;  Location[facility] END[facility]COPY;  Service: Endoscopy;  Laterality: N/A;   COLOSTOMY Left 12/07/2016   Procedure: COLOSTOMY;  Surgeon: Ricarda Frame, MD;  Location: [facility] ORS;  Service: General;  Laterality: Left;   COLOSTOMY REVERSAL N/A 03/21/2017   Procedure: COLOSTOMY REVERSAL;  Surgeon: Ricarda Frame, MD;  Location: [facility] ORS;  Service: General;  Laterality: N/A;   COLOSTOMY TAKEDOWN N/A 03/21/2017   Procedure: LAPAROSCOPIC COLOSTOMY TAKEDOWN;  Surgeon: Ricarda Frame, MD;  Location: [facility] ORS;  Service: General;  Laterality: N/A;   CYSTOSCOPY WITH STENT PLACEMENT Bilateral 03/21/2017   Procedure: CYSTOSCOPY WITH LIGHTED STENT PLACEMENT;  Surgeon: Riki Altes, MD;  Location: [facility] ORS;  Service: Urology;  Laterality: Bilateral;   ESOPHAGOGASTRODUODENOSCOPY     ESOPHAGOGASTRODUODENOSCOPY N/A 05/30/2016   Procedure: ESOPHAGOGASTRODUODENOSCOPY (EGD);  Surgeon: Christena Deem,  MD;  Location[facility] ENDOSCOPY;  Service: Endoscopy;  Laterality: N/A;   ESOPHAGOGASTRODUODENOSCOPY N/A 01/14/2021   Procedure: ESOPHAGOGASTRODUODENOSCOPY (EGD);  Surgeon: Toledo, Boykin Nearing, MD;  Location: [facility] ENDOSCOPY;  Service: Gastroenterology;  Laterality: N/A;   ESOPHAGOGASTRODUODENOSCOPY (EGD) WITH PROPOFOL N/A 04/28/2021   Procedure: ESOPHAGOGASTRODUODENOSCOPY (EGD) WITH PROPOFOL;  Surgeon: Toledo, Boykin Nearing, MD;  Location: [facility] ENDOSCOPY;  Service: Gastroenterology;  Laterality: N/A;   EYE SURGERY     cataracts   FLEXIBLE SIGMOIDOSCOPY N/A 03/21/2017   Procedure: FLEXIBLE SIGMOIDOSCOPY;  Surgeon: Ricarda Frame, MD;  Location: [facility] ORS;  Service: General;  Laterality: N/A;   FLEXIBLE SIGMOIDOSCOPY N/A 06/24/2021   Procedure: Arnell Sieving;  Surgeon: Wyline Mood, MD;  Location: [facility] ENDOSCOPY;  Service: Gastroenterology;  Laterality: N/A;   FRACTURE SURGERY Bilateral    right arm and left wrist   INCISION AND DRAINAGE ABSCESS N/A 12/07/2016   Procedure: DRAINAGE  OF INTRA ABDOMINAL ABSCESS;  Surgeon: Ricarda Frame, MD;  Location: [facility] OR[facility];  Service: General;  Laterality: N/A;   LAPAROTOMY N/A 12/07/2016   Procedure: EXPLORATORY LAPAROTOMY;  Surgeon: Ricarda Frame, MD;  Location: [facility] ORS;  Service: General;  Laterality: N/A;   PROSTATE SURGERY     Microwave therapy   TONSILLECTOMY     TOTAL HIP ARTHROPLASTY Right 09/27/2021   Procedure: TOTAL HIP ARTHROPLASTY ANTERIOR APPROACH;  Surgeon: Lyndle Herrlich, MD;  Location: [facility] ORS;  Service: Orthopedics;  Laterality: Right;   TOTAL KNEE ARTHROPLASTY Right 07/27/2016   Procedure: TOTAL KNEE ARTHROPLASTY;  Surgeon: Deeann Saint, MD;  Location: [facility] ORS;  Service: Orthopedics;  Laterality: Right;  Dr. Apolinar Junes had to place Urinary catheter due to prostate cancer history.  Using flexible scope.   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  Tobacco Use   Smoking status: Former    Current packs/day: 0.00    Average packs/day: 1 pack/day for 27.0 years (27.0 ttl pk-yrs)    Types: Cigarettes    Start date: 04/18/1961    Quit date: 04/18/1988    Years since quitting: 35.1   Smokeless tobacco: Current    Types: Dorna Bloom    Last attempt to quit: 12/20/2021  Vaping Use   Vaping status: Never Used  Substance and Sexual Activity   Alcohol use: Yes    Alcohol/week: 7.0 standard drinks of alcohol    Types: 7 Glasses of wine per week    Comment: 1 glass per day-Former heavy use ETOH   Drug use: No   Sexual activity: Not Currently    Birth control/protection: None    Comment: Married  Other Topics Concern   Not on file  Social History Narrative   1 son deceased, 1 daughter living   Social Drivers of Corporate investment banker Strain: Low Risk  (05/31/2023)   Overall Financial Resource Strain (CARDIA)    Difficulty of Paying Living Expenses: Not very hard  Food Insecurity: No Food Insecurity (05/31/2023)   Hunger Vital Sign    Worried About Running Out of Food in the Last Year: Never true    Ran Out of Food in the Last Year: Never true  Transportation Needs: No Transportation Needs (05/31/2023)   PRAPARE - Administrator, Civil Service (Medical): No    Lack of Transportation (Non-Medical): No  Physical Activity: Inactive (05/31/2023)   Exercise Vital Sign    Days of Exercise per Week: 0 days    Minutes of Exercise per Session: 0 min  Stress: No Stress Concern Present  (05/31/2023)   [employer] of Occupational Health - Occupational Stress Questionnaire    Feeling of Stress : Only a little  Social Connections: Moderately Integrated (05/31/2023)   Social Connection and Isolation Panel [NHANES]    Frequency of Communication with Friends and Family: More than three times a week    Frequency of Social Gatherings with Friends and Family: Three times a week    Attends Religious Services: More than 4 times per year    Active Member of Clubs or Organizations: Yes    Attends Banker Meetings: More than 4 times per year    Marital Status: Widowed    Tobacco Counseling Ready to quit: Not Answered Counseling given: Not Answered   Clinical Intake:  Pre-visit preparation completed: Yes  Pain : No/denies pain     BMI - recorded: 29.53 Nutritional Status: BMI 25 -29 Overweight Nutritional Risks: None Diabetes: No  How often do you need to have someone help you when you read instructions, pamphlets, or other written materials from your doctor or pharmacy?: 1 - Never  Interpreter Needed?: No  Information entered by :: Tora Kindred, CMA   Activities of Daily Living    05/31/2023    2:26 PM 05/29/2023   12:52 PM  In your present state of health, do you have any difficulty performing the following activities:  Hearing? 0 0  Vision? 0 0  Difficulty concentrating or making decisions? 0 0  Walking or climbing stairs? 1 1  Comment uses cane outside   Dressing or bathing? 0 0  Doing errands, shopping? 0  0  Preparing Food and eating ? N N  Using the Toilet? N N  In the past six months, have you accidently leaked urine? N N  Do you have problems with loss of bowel control? N N  Managing your Medications? N N  Managing your Finances? N N  Housekeeping or managing your Housekeeping? Y Y  Comment would like someone to clean and wash clothes     Patient Care Team: Beryle Flock Marzella Schlein, MD as PCP - General (Family Medicine) End,  Cristal Deer, MD as PCP - Cardiology (Cardiology) End, Cristal Deer, MD as Consulting Physician (Cardiology) Deeann Saint, MD (Orthopedic Surgery) Schnier, Latina Craver, MD (Vascular Surgery) Willeen Niece, MD (Dermatology) Elliot Cousin, MD (Inactive) as Consulting Physician (Ophthalmology) Juanell Fairly, RN as Case Manager Pa, [facility] (Optometry)  Indicate any recent Medical Services you may have received from other than [facility] providers in the past year (date may be approximate).     Assessment:   This is a routine wellness examination for [facility].  Hearing/Vision screen Hearing Screening - Comments:: Denies hearing loss Vision Screening - Comments:: Gets eye exams, [facility] [place]   Goals Addressed             This Visit's Progress    Patient Stated       Maintain current health      Depression Screen    05/31/2023    2:35 PM 03/27/2023    2:26 PM 03/06/2023    4:25 PM 02/13/2023    3:06 PM 10/24/2022    2:17 PM 08/11/2022    3:53 PM 05/25/2022    2:53 PM  PHQ 2/9 Scores  PHQ - 2 Score 2 2 3 2 2 6 1   PHQ- 9 Score 3 7 12  6 12 3     Fall Risk    05/31/2023    2:40 PM 05/29/2023   12:52 PM 03/27/2023    2:26 PM 02/13/2023    2:30 PM 10/24/2022    2:17 PM  Fall Risk   Falls in the past year? 1 0 1 0 0  Number falls in past yr: 0 0 0 0 0  Injury with Fall? 1 1 1  0 0  Risk for fall due to : No Fall Risks;History of fall(s);Orthopedic patient;Impaired balance/gait;Impaired mobility  Impaired balance/gait Medication side effect;Impaired balance/gait No Fall Risks  Follow up Falls prevention discussed;Falls evaluation completed   Falls prevention discussed Falls evaluation completed    MEDICARE RISK AT HOME: Medicare Risk at Home Any stairs in or around the home?: No If so, are there any without handrails?: No Home free of loose throw rugs in walkways, pet beds, electrical cords, etc?: Yes Adequate lighting in your home to reduce risk of falls?:  Yes Life alert?: Yes Use of a cane, walker or w/c?: Yes (cane when outside) Grab bars in the bathroom?: No Shower chair or bench in shower?: No Elevated toilet seat or a handicapped toilet?: No  TIMED UP AND GO:  Was the test performed?  No    Cognitive Function:        05/31/2023    2:41 PM 05/25/2022    2:59 PM 09/02/2016    3:45 PM  6CIT Screen  What Year? 0 points 0 points 0 points  What month? 0 points 0 points 0 points  What time? 0 points 0 points 0 points  Count back from 20 0 points 0 points 0 points  Months in reverse 0 points 0 points  0 points  Repeat phrase 0 points 2 points 0 points  Total Score 0 points 2 points 0 points    Immunizations Immunization History  Administered Date(s) Administered   Fluad Quad(high Dose 65+) 01/14/2020, 01/26/2021, 12/28/2021   Fluad Trivalent(High Dose 65+) 03/06/2023   Influenza, High Dose Seasonal PF 01/26/2017, 02/01/2018, 01/31/2019   Influenza,inj,Quad PF,6+ Mos 02/08/2016   Influenza,inj,quad, With Preservative 12/18/2014   PFIZER Comirnaty(Gray Top)Covid-19 Tri-Sucrose Vaccine 08/24/2020   PFIZER(Purple Top)SARS-COV-2 Vaccination 05/31/2019, 06/25/2019, 01/22/2020   Pfizer(Comirnaty)Fall Seasonal Vaccine 12 years and older 01/24/2022   Pneumococcal Conjugate-13 05/14/2018   Pneumococcal Polysaccharide-23 12/18/2014   Pneumococcal-Unspecified 12/18/2014   Tdap 01/14/2020   Zoster Recombinant(Shingrix) 01/24/2022    TDAP status: Up to date  Flu Vaccine status: Up to date  Pneumococcal vaccine status: Up to date  Covid-19 vaccine status: Declined, Education has been provided regarding the importance of this vaccine but patient still declined. Advised may receive this vaccine at local pharmacy or Health Dept.or vaccine clinic. Aware to provide a copy of the vaccination record if obtained from local pharmacy or Health Dept. Verbalized acceptance and understanding.  Qualifies for Shingles Vaccine? Yes   Zostavax  completed No   Shingrix Completed?: No.    Education has been provided regarding the importance of this vaccine. Patient has been advised to call insurance company to determine out of pocket expense if they have not yet received this vaccine. Advised may also receive vaccine at local pharmacy or Health Dept. Verbalized acceptance and understanding. Needs 2nd dose of shingles vaccine  Screening Tests Health Maintenance  Topic Date Due   Zoster Vaccines- Shingrix (2 of 2) 03/21/2022   COVID-19 Vaccine (6 - 2024-25 season) 12/18/2022   Medicare Annual Wellness (AWV)  05/30/2024   DTaP/Tdap/Td (2 - Td or Tdap) 01/13/2030   Pneumonia Vaccine 58+ Years old  Completed   INFLUENZA VACCINE  Completed   HPV VACCINES  Aged Out   Colonoscopy  Discontinued    Health Maintenance  Health Maintenance Due  Topic Date Due   Zoster Vaccines- Shingrix (2 of 2) 03/21/2022   COVID-19 Vaccine (6 - 2024-25 season) 12/18/2022    Colorectal cancer screening: No longer required.   Lung Cancer Screening: (Low Dose CT Chest recommended if Age 29-80 years, 20 pack-year currently smoking OR have quit w/in 15years.) does not qualify.   Lung Cancer Screening Referral: n/a  Additional Screening:  Hepatitis C Screening: does not qualify;   Vision Screening: Recommended annual ophthalmology exams for early detection of glaucoma and other disorders of the eye.  Dental Screening: Recommended annual dental exams for proper oral hygiene   Community Resource Referral / Chronic Care Management: CRR required this visit?  No   CCM required this visit?  No     Plan:     I have personally reviewed and noted the following in the patient's chart:   Medical and social history Use of alcohol, tobacco or illicit drugs  Current medications and supplements including opioid prescriptions. Patient is not currently taking opioid prescriptions. Functional ability and status Nutritional status Physical  activity Advanced directives List of other physicians Hospitalizations, surgeries, and ER visits in previous 12 months Vitals Screenings to include cognitive, depression, and falls Referrals and appointments  In addition, I have reviewed and discussed with patient certain preventive protocols, quality metrics, and best practice recommendations. A written personalized care plan for preventive services as well as general preventive health recommendations were provided to patient.     Gunnar Fusi  [place], [facility]   05/31/2023   After Visit Summary: (MyChart) Due to this being a telephonic visit, the after visit summary with patients personalized plan was offered to patient via MyChart   Nurse Notes:  Needs 2nd shingles vaccine Declined covid vaccine

## 2023-06-02 MED ORDER — TADALAFIL 10 MG PO TABS: 10.0000 mg | ORAL_TABLET | ORAL | 1 refills | Status: DC | PRN

## 2023-06-02 NOTE — Telephone Encounter (Signed)
Please see the MyChart message reply(ies) for my assessment and plan.    This patient gave consent for this Medical Advice Message and is aware that it may result in a bill to [employer], as well as the possibility of receiving a bill for a co-payment or deductible. They are an established patient, but are not seeking medical advice exclusively about a problem treated during an in person or video visit in the last seven days. I did not recommend an in person or video visit within seven days of my reply.    I spent a total of 7 minutes cumulative time within 7 days through [employer].  Shirlee Latch, MD

## 2023-06-22 DIAGNOSIS — G5793 Unspecified mononeuropathy of bilateral lower limbs: Secondary | ICD-10-CM | POA: Diagnosis not present

## 2023-06-22 DIAGNOSIS — R200 Anesthesia of skin: Secondary | ICD-10-CM | POA: Diagnosis not present

## 2023-06-22 DIAGNOSIS — R202 Paresthesia of skin: Secondary | ICD-10-CM | POA: Diagnosis not present

## 2023-06-23 DIAGNOSIS — F330 Major depressive disorder, recurrent, mild: Secondary | ICD-10-CM | POA: Diagnosis not present

## 2023-06-23 DIAGNOSIS — F1020 Alcohol dependence, uncomplicated: Secondary | ICD-10-CM | POA: Diagnosis not present

## 2023-06-23 DIAGNOSIS — I2510 Atherosclerotic heart disease of native coronary artery without angina pectoris: Secondary | ICD-10-CM | POA: Diagnosis not present

## 2023-06-23 DIAGNOSIS — K3189 Other diseases of stomach and duodenum: Secondary | ICD-10-CM | POA: Diagnosis not present

## 2023-06-23 DIAGNOSIS — Z008 Encounter for other general examination: Secondary | ICD-10-CM | POA: Diagnosis not present

## 2023-06-23 DIAGNOSIS — Z6830 Body mass index (BMI) 30.0-30.9, adult: Secondary | ICD-10-CM | POA: Diagnosis not present

## 2023-06-23 DIAGNOSIS — R2681 Unsteadiness on feet: Secondary | ICD-10-CM | POA: Diagnosis not present

## 2023-06-23 DIAGNOSIS — Z8546 Personal history of malignant neoplasm of prostate: Secondary | ICD-10-CM | POA: Diagnosis not present

## 2023-06-23 DIAGNOSIS — E669 Obesity, unspecified: Secondary | ICD-10-CM | POA: Diagnosis not present

## 2023-06-23 DIAGNOSIS — N182 Chronic kidney disease, stage 2 (mild): Secondary | ICD-10-CM | POA: Diagnosis not present

## 2023-06-23 DIAGNOSIS — K5150 Left sided colitis without complications: Secondary | ICD-10-CM | POA: Diagnosis not present

## 2023-07-03 ENCOUNTER — Encounter: Payer: Self-pay | Admitting: Family Medicine

## 2023-07-05 ENCOUNTER — Other Ambulatory Visit: Payer: Self-pay

## 2023-07-05 DIAGNOSIS — N4889 Other specified disorders of penis: Secondary | ICD-10-CM

## 2023-07-05 DIAGNOSIS — R39198 Other difficulties with micturition: Secondary | ICD-10-CM

## 2023-07-05 DIAGNOSIS — R339 Retention of urine, unspecified: Secondary | ICD-10-CM

## 2023-07-05 MED ORDER — TADALAFIL 5 MG PO TABS: 5.0000 mg | ORAL_TABLET | ORAL | 5 refills | Status: DC | PRN

## 2023-07-06 ENCOUNTER — Telehealth: Payer: Self-pay | Admitting: Pharmacy Technician

## 2023-07-06 ENCOUNTER — Other Ambulatory Visit (HOSPITAL_COMMUNITY): Payer: Self-pay

## 2023-07-06 ENCOUNTER — Other Ambulatory Visit: Payer: Self-pay

## 2023-07-06 DIAGNOSIS — R339 Retention of urine, unspecified: Secondary | ICD-10-CM

## 2023-07-06 DIAGNOSIS — Z8546 Personal history of malignant neoplasm of prostate: Secondary | ICD-10-CM

## 2023-07-06 DIAGNOSIS — N4889 Other specified disorders of penis: Secondary | ICD-10-CM

## 2023-07-06 DIAGNOSIS — R39198 Other difficulties with micturition: Secondary | ICD-10-CM

## 2023-07-06 NOTE — Telephone Encounter (Signed)
 Pharmacy Patient Advocate Encounter   Received notification from CoverMyMeds that prior authorization for Tadalafil 5MG  tablets is required/requested.   Insurance verification completed.   The patient is insured through [employer] .  Medications that are used for treatment for erectile dysfunction are excluded from Medicare Part D coverage.

## 2023-07-07 ENCOUNTER — Other Ambulatory Visit: Payer: Self-pay

## 2023-07-07 ENCOUNTER — Telehealth: Payer: Self-pay | Admitting: Pharmacist

## 2023-07-07 DIAGNOSIS — R339 Retention of urine, unspecified: Secondary | ICD-10-CM

## 2023-07-07 DIAGNOSIS — N4889 Other specified disorders of penis: Secondary | ICD-10-CM

## 2023-07-07 DIAGNOSIS — R39198 Other difficulties with micturition: Secondary | ICD-10-CM

## 2023-07-07 MED ORDER — TADALAFIL 5 MG PO TABS
5.0000 mg | ORAL_TABLET | Freq: Every day | ORAL | 2 refills | Status: DC | PRN
Start: 2023-07-07 — End: 2024-03-12
  Filled 2023-07-07 – 2023-12-25 (×2): qty 30, 30d supply, fill #0

## 2023-07-07 NOTE — Progress Notes (Signed)
   07/07/2023  Patient ID: Cameron Settle., male   DOB: [birth year], 83 y.o.   MRN: [id number]  Received referral regarding Tadalafil 5mg  every other day cost and PA concerns.  Unfortunately the medication is only covered for BPH and PAH diagnoses. BPH is not listed in patient's profile.  This would be to cover 30 tablets for 30 days- only covered for 26 weeks of the year. Also a Tier 3 medication.  Additionally, patient would have to show proof of:  For benign prostatic hyperplasia (BPH): the patient has experienced an inadequate  treatment response, intolerance, or has a contraindication to both of the following: 1) alpha blocker, 2) 5-alpha reductase inhibitor (5-ARI).  *Patient does NOT have any history of these (except tamsulosin in 2017)  Options: - Tadalafil 5mg  for 15 tablets is [facility] at [facility]) or $15.66 at [facility]) or $11.35 at [employer] (GoodRx) - $15 for 30 tablets for 30DS at [facility] with the patient on the phone. Explained PA would not be approved. Could send script for 30 tablets for 30DS and this would cover 2 months for him. Would like sent to [facility] pharmacy for $15 pricing. Thanked for this assistance and Michelle Nasuti for passing referral along. Updated Rx sent to the pharmacy- aware to take 1 tablet EVERY OTHER DAY.     Ricka Burdock, PharmD [facility]  Phone Number: [phone]

## 2023-07-16 ENCOUNTER — Other Ambulatory Visit: Payer: Self-pay | Admitting: Family Medicine

## 2023-07-16 DIAGNOSIS — I10 Essential (primary) hypertension: Secondary | ICD-10-CM

## 2023-07-17 DIAGNOSIS — M1712 Unilateral primary osteoarthritis, left knee: Secondary | ICD-10-CM | POA: Diagnosis not present

## 2023-07-17 DIAGNOSIS — M7062 Trochanteric bursitis, left hip: Secondary | ICD-10-CM | POA: Diagnosis not present

## 2023-07-18 NOTE — Telephone Encounter (Signed)
 Rx 02/27/23 #90 1RF- too soon Requested Prescriptions  Pending Prescriptions Disp Refills   amLODipine (NORVASC) 5 MG tablet [Pharmacy Med Name: AMLODIPINE BESYLATE 5 MG TAB] 90 tablet 1    Sig: TAKE 1 TABLET BY MOUTH EVERY DAY     Cardiovascular: Calcium Channel Blockers 2 Passed - 07/18/2023  1:40 PM      Passed - Last BP in normal range    BP Readings from Last 1 Encounters:  05/26/23 120/80         Passed - Last Heart Rate in normal range    Pulse Readings from Last 1 Encounters:  05/26/23 67         Passed - Valid encounter within last 6 months    Recent Outpatient Visits   None

## 2023-08-04 ENCOUNTER — Other Ambulatory Visit: Payer: Self-pay | Admitting: Family Medicine

## 2023-08-04 DIAGNOSIS — I110 Hypertensive heart disease with heart failure: Secondary | ICD-10-CM

## 2023-08-04 DIAGNOSIS — I10 Essential (primary) hypertension: Secondary | ICD-10-CM

## 2023-08-04 NOTE — Telephone Encounter (Signed)
 Requested Prescriptions  Pending Prescriptions Disp Refills   metoprolol  tartrate (LOPRESSOR ) 100 MG tablet [Pharmacy Med Name: METOPROLOL  TARTRATE 100 MG TAB] 180 tablet 0    Sig: TAKE 1 TABLET BY MOUTH TWICE A DAY     Cardiovascular:  Beta Blockers Passed - 08/04/2023  1:20 PM      Passed - Last BP in normal range    BP Readings from Last 1 Encounters:  05/26/23 120/80         Passed - Last Heart Rate in normal range    Pulse Readings from Last 1 Encounters:  05/26/23 67         Passed - Valid encounter within last 6 months    Recent Outpatient Visits   None

## 2023-08-09 ENCOUNTER — Other Ambulatory Visit: Payer: Self-pay | Admitting: Family Medicine

## 2023-08-09 DIAGNOSIS — I10 Essential (primary) hypertension: Secondary | ICD-10-CM

## 2023-08-10 NOTE — Telephone Encounter (Signed)
 Requested Prescriptions  Pending Prescriptions Disp Refills   amLODipine  (NORVASC ) 5 MG tablet [Pharmacy Med Name: AMLODIPINE  BESYLATE 5 MG TAB] 90 tablet 0    Sig: TAKE 1 TABLET BY MOUTH EVERY DAY     Cardiovascular: Calcium  Channel Blockers 2 Passed - 08/10/2023  9:13 AM      Passed - Last BP in normal range    BP Readings from Last 1 Encounters:  05/26/23 120/80         Passed - Last Heart Rate in normal range    Pulse Readings from Last 1 Encounters:  05/26/23 67         Passed - Valid encounter within last 6 months    Recent Outpatient Visits   None

## 2023-08-11 DIAGNOSIS — M1712 Unilateral primary osteoarthritis, left knee: Secondary | ICD-10-CM | POA: Diagnosis not present

## 2023-08-15 DIAGNOSIS — G4733 Obstructive sleep apnea (adult) (pediatric): Secondary | ICD-10-CM | POA: Diagnosis not present

## 2023-09-11 ENCOUNTER — Encounter: Payer: Self-pay | Admitting: Family Medicine

## 2023-09-12 ENCOUNTER — Other Ambulatory Visit: Payer: Self-pay

## 2023-09-12 ENCOUNTER — Telehealth: Payer: Self-pay | Admitting: Family Medicine

## 2023-09-12 ENCOUNTER — Ambulatory Visit: Payer: Self-pay

## 2023-09-12 ENCOUNTER — Ambulatory Visit: Admitting: Family Medicine

## 2023-09-12 ENCOUNTER — Emergency Department

## 2023-09-12 ENCOUNTER — Emergency Department
Admission: EM | Admit: 2023-09-12 | Discharge: 2023-09-12 | Disposition: A | Discharge status: Home / Self Care | Attending: Emergency Medicine | Admitting: Emergency Medicine

## 2023-09-12 ENCOUNTER — Encounter: Payer: Self-pay | Admitting: Emergency Medicine

## 2023-09-12 DIAGNOSIS — I110 Hypertensive heart disease with heart failure: Secondary | ICD-10-CM | POA: Insufficient documentation

## 2023-09-12 DIAGNOSIS — I700 Atherosclerosis of aorta: Secondary | ICD-10-CM | POA: Diagnosis not present

## 2023-09-12 DIAGNOSIS — I509 Heart failure, unspecified: Secondary | ICD-10-CM | POA: Diagnosis not present

## 2023-09-12 DIAGNOSIS — J069 Acute upper respiratory infection, unspecified: Secondary | ICD-10-CM | POA: Insufficient documentation

## 2023-09-12 DIAGNOSIS — Z720 Tobacco use: Secondary | ICD-10-CM | POA: Diagnosis not present

## 2023-09-12 DIAGNOSIS — Z7951 Long term (current) use of inhaled steroids: Secondary | ICD-10-CM | POA: Diagnosis not present

## 2023-09-12 DIAGNOSIS — R059 Cough, unspecified: Secondary | ICD-10-CM | POA: Diagnosis not present

## 2023-09-12 DIAGNOSIS — I10 Essential (primary) hypertension: Secondary | ICD-10-CM | POA: Diagnosis not present

## 2023-09-12 LAB — URINALYSIS, ROUTINE W REFLEX MICROSCOPIC
Bilirubin Urine: NEGATIVE
Glucose, UA: NEGATIVE mg/dL
Ketones, ur: NEGATIVE mg/dL
Leukocytes,Ua: NEGATIVE
Nitrite: NEGATIVE
Protein, ur: 30 mg/dL — AB
Specific Gravity, Urine: 1.018 (ref 1.005–1.030)
Squamous Epithelial / HPF: 0 /HPF (ref 0–5)
pH: 5 (ref 5.0–8.0)

## 2023-09-12 LAB — COMPREHENSIVE METABOLIC PANEL WITH GFR
ALT: 12 U/L (ref 0–44)
AST: 23 U/L (ref 15–41)
Albumin: 3.4 g/dL — ABNORMAL LOW (ref 3.5–5.0)
Alkaline Phosphatase: 44 U/L (ref 38–126)
Anion gap: 8 (ref 5–15)
BUN: 16 mg/dL (ref 8–23)
CO2: 28 mmol/L (ref 22–32)
Calcium: 9 mg/dL (ref 8.9–10.3)
Chloride: 99 mmol/L (ref 98–111)
Creatinine, Ser: 1.24 mg/dL (ref 0.61–1.24)
GFR, Estimated: 58 mL/min — ABNORMAL LOW (ref >60)
Glucose, Bld: 175 mg/dL — ABNORMAL HIGH (ref 70–99)
Potassium: 3.2 mmol/L — ABNORMAL LOW (ref 3.5–5.1)
Sodium: 135 mmol/L (ref 135–145)
Total Bilirubin: 0.8 mg/dL (ref 0.0–1.2)
Total Protein: 7.1 g/dL (ref 6.5–8.1)

## 2023-09-12 LAB — CBC
HCT: 35.9 % — ABNORMAL LOW (ref 39.0–52.0)
Hemoglobin: 11.9 g/dL — ABNORMAL LOW (ref 13.0–17.0)
MCH: 30.6 pg (ref 26.0–34.0)
MCHC: 33.1 g/dL (ref 30.0–36.0)
MCV: 92.3 fL (ref 80.0–100.0)
Platelets: 225 10*3/uL (ref 150–400)
RBC: 3.89 MIL/uL — ABNORMAL LOW (ref 4.22–5.81)
RDW: 13.2 % (ref 11.5–15.5)
WBC: 14.6 10*3/uL — ABNORMAL HIGH (ref 4.0–10.5)
nRBC: 0 % (ref 0.0–0.2)

## 2023-09-12 LAB — RESP PANEL BY RT-PCR (RSV, FLU A&B, COVID)  RVPGX2
Influenza A by PCR: NEGATIVE
Influenza B by PCR: NEGATIVE
Resp Syncytial Virus by PCR: NEGATIVE
SARS Coronavirus 2 by RT PCR: NEGATIVE

## 2023-09-12 LAB — LIPASE, BLOOD: Lipase: 38 U/L (ref 11–51)

## 2023-09-12 MED ORDER — PREDNISONE 20 MG PO TABS
60.0000 mg | ORAL_TABLET | Freq: Once | ORAL | Status: AC
  Administered 2023-09-12: 60 mg via ORAL
  Filled 2023-09-12: qty 3

## 2023-09-12 MED ORDER — PREDNISONE 20 MG PO TABS: 40.0000 mg | ORAL_TABLET | Freq: Every day | ORAL | 0 refills | Status: AC

## 2023-09-12 MED ORDER — IPRATROPIUM-ALBUTEROL 0.5-2.5 (3) MG/3ML IN SOLN
3.0000 mL | Freq: Once | RESPIRATORY_TRACT | Status: AC
  Administered 2023-09-12: 3 mL via RESPIRATORY_TRACT
  Filled 2023-09-12: qty 3

## 2023-09-12 MED ORDER — AMOXICILLIN-POT CLAVULANATE 875-125 MG PO TABS: 1.0000 | ORAL_TABLET | Freq: Two times a day (BID) | ORAL | 0 refills | Status: AC

## 2023-09-12 MED ORDER — ALBUTEROL SULFATE HFA 108 (90 BASE) MCG/ACT IN AERS: 2.0000 | INHALATION_SPRAY | Freq: Four times a day (QID) | RESPIRATORY_TRACT | 0 refills | Status: DC | PRN

## 2023-09-12 NOTE — Telephone Encounter (Signed)
 Pt is currently in the ED.  Show arrived @11 .   Reason for Disposition  [1] Follow-up call to recent contact AND [2] information only call, no triage required  Protocols used: Information Only Call - No Triage-A-AH

## 2023-09-12 NOTE — ED Provider Notes (Signed)
 [facility] Provider Note    Event Date/Time   First MD Initiated Contact with Patient 09/12/23 1126     (approximate)   History   Weakness, Cough, and Nausea   HPI  Cameron Foster. is a 83 year old male with history of hypertension, CHF, A-fib presenting to the emergency department for evaluation of cough congestion.  Over the last week, patient has had ongoing cough and congestion.  Multiple family members sick with similar symptoms.  However, patient continues to have worsening symptoms despite using over-the-counter medicine.  Additionally had 1 episode of vomiting and diarrhea a few days ago.  Subjective fevers at home.  History of tobacco use, but no known history of COPD.  Reports occasional abdominal cramping, currently denies any abdominal pain.  No chest pain.     Physical Exam   Triage Vital Signs: ED Triage Vitals  Encounter Vitals Group     BP 09/12/23 1115 119/69     Systolic BP Percentile --      Diastolic BP Percentile --      Pulse Rate 09/12/23 1115 72     Resp 09/12/23 1115 20     Temp 09/12/23 1115 98.8 F (37.1 C)     Temp Source 09/12/23 1115 Oral     SpO2 09/12/23 1115 92 %     Weight 09/12/23 1116 204 lb (92.5 kg)     Height 09/12/23 1116 5\' 9"  (1.753 m)     Head Circumference --      Peak Flow --      Pain Score 09/12/23 1115 4     Pain Loc --      Pain Education --      Exclude from Growth Chart --     Most recent vital signs: Vitals:   09/12/23 1552 09/12/23 1600  BP:  121/68  Pulse: 77 73  Resp: 18 (!) 22  Temp:    SpO2: 94% 100%     General: Awake, interactive  CV:  Regular rate, good peripheral perfusion.  Resp:  Unlabored respirations, lung sounds diminished with occasional expiratory wheeze, fair air movement Abd:  Nondistended, soft, no significant tenderness palpation Neuro:  Symmetric facial movement, fluid speech   ED Results / Procedures / Treatments   Labs (all labs ordered are listed,  but only abnormal results are displayed) Labs Reviewed  COMPREHENSIVE METABOLIC PANEL WITH GFR - Abnormal; Notable for the following components:      Result Value   Potassium 3.2 (*)    Glucose, Bld 175 (*)    Albumin  3.4 (*)    GFR, Estimated 58 (*)    All other components within normal limits  CBC - Abnormal; Notable for the following components:   WBC 14.6 (*)    RBC 3.89 (*)    Hemoglobin 11.9 (*)    HCT 35.9 (*)    All other components within normal limits  URINALYSIS, ROUTINE W REFLEX MICROSCOPIC - Abnormal; Notable for the following components:   Color, Urine YELLOW (*)    APPearance CLEAR (*)    Hgb urine dipstick SMALL (*)    Protein, ur 30 (*)    Bacteria, UA RARE (*)    All other components within normal limits  RESP PANEL BY RT-PCR (RSV, FLU A&B, COVID)  RVPGX2  LIPASE, BLOOD     EKG EKG independently reviewed interpreted by myself (ER attending) demonstrates:  EKG demonstrates what I suspect is sinus rhythm with frequent PVCs at a  rate of 78, QRS 148, QTc 497, bifascicular block noted  RADIOLOGY Imaging independently reviewed and interpreted by myself demonstrates:  CXR without focal consolidation  Formal Radiology Read:  DG Chest 2 View Result Date: 09/12/2023 CLINICAL DATA:  Weakness cough EXAM: CHEST - 2 VIEW COMPARISON:  06/27/2021 FINDINGS: The heart size and mediastinal contours are within normal limits. Both lungs are clear. The visualized skeletal structures are unremarkable. Aortic atherosclerosis. IMPRESSION: No active cardiopulmonary disease. Electronically Signed   By: Esmeralda Hedge M.D.   On: 09/12/2023 15:04    PROCEDURES:  Critical Care performed: No  Procedures   MEDICATIONS ORDERED IN ED: Medications  ipratropium-albuterol  (DUONEB) 0.5-2.5 (3) MG/3ML nebulizer solution 3 mL (3 mLs Nebulization Given 09/12/23 1153)  ipratropium-albuterol  (DUONEB) 0.5-2.5 (3) MG/3ML nebulizer solution 3 mL (3 mLs Nebulization Given 09/12/23 1549)   predniSONE  (DELTASONE ) tablet 60 mg (60 mg Oral Given 09/12/23 1548)     IMPRESSION / MDM / ASSESSMENT AND PLAN / ED COURSE  I reviewed the triage vital signs and the nursing notes.  Differential diagnosis includes, but is not limited to, viral syndrome, pneumonia, new onset COPD with flare, pneumothorax  Patient's presentation is most consistent with acute presentation with potential threat to life or bodily function.  83 year old male presenting to the emergency department for evaluation of cough and congestion.  Low normal oxygen on room air at 92%, otherwise stable vitals.  Will obtain labs, x-Amara Manalang to further evaluate.  4:21 PM Labs with Sabino blood cell count of 14 point's, stable anemia.  CMP without critical derangements.  Viral swab negative.  Urine without evidence of infection.  Chest x-Candence Sease without evidence of pneumonia.  Did trial a DuoNeb and patient did report improvement with this.  I suspect he may have some component of chronic lung disease/undiagnosed COPD.  He is satting in the mid 90s on room air with normal heart rate.  The patient and family are comfortable with discharge home which I do think is reasonable.  Will DC with albuterol  inhaler, steroids, course of antibiotics in the setting of sputum change.  Strict return precautions provided.  Patient discharged in stable condition.     FINAL CLINICAL IMPRESSION(S) / ED DIAGNOSES   Final diagnoses:  Upper respiratory tract infection, unspecified type     Rx / DC Orders   ED Discharge Orders          Ordered    amoxicillin -clavulanate (AUGMENTIN ) 875-125 MG tablet  2 times daily        09/12/23 1621    predniSONE  (DELTASONE ) 20 MG tablet  Daily with breakfast        09/12/23 1621    albuterol  (VENTOLIN  HFA) 108 (90 Base) MCG/ACT inhaler  Every 6 hours PRN        09/12/23 1621             Note:  This document was prepared using Dragon voice recognition software and may include unintentional dictation  errors.   Claria Crofts, MD 09/12/23 [phone]

## 2023-09-12 NOTE — Telephone Encounter (Signed)
 Noted

## 2023-09-12 NOTE — Telephone Encounter (Addendum)
 Chief Complaint: Shortness of Breath Symptoms: Weak Frequency: Ongoing, worsening Pertinent Negatives: Patient denies fever Disposition: [] ED /[x] Urgent Care (no appt availability in office) / [] Appointment(In office/virtual)/ []  [facility]/ [] Home Care/ [] Refused Recommended Disposition /[] [facility]/ []  Follow-up with PCP Additional Notes: Pt's friend calling in to report pt has been experiencing worsening SOB, cough x 1 week. Pt's notes ongoing weakness, overall feeling unwell. No available OV, pt to proceed to ED for eval/treat, agreeable. This RN educated pt on home care, new-worsening symptoms, when to call back/seek emergent care. Pt verbalized understanding and agrees to plan.     Copied from CRM [id number]. Topic: Clinical - Red Word Triage >> Sep 12, 2023 10:07 AM Baldo Levan wrote: Red Word that prompted transfer to Nurse Triage: Patient and his friend are on the phone, stating patient has a pule of 86 and BP of 135/79. Patient has been having a cough, shortness of breath and very weak. Patient wanted an appointment but no avaliable appointments today in the office. Reason for Disposition  [1] MODERATE difficulty breathing (e.g., speaks in phrases, SOB even at rest, pulse 100-120) AND [2] NEW-onset or WORSE than normal  Answer Assessment - Initial Assessment Questions 1. RESPIRATORY STATUS: "Describe your breathing?" (e.g., wheezing, shortness of breath, unable to speak, severe coughing)      Shortness of Breath 2. ONSET: "When did this breathing problem begin?"      X 1 week 3. PATTERN "Does the difficult breathing come and go, or has it been constant since it started?"      Constant 4. SEVERITY: "How bad is your breathing?" (e.g., mild, moderate, severe)    - MILD: No SOB at rest, mild SOB with walking, speaks normally in sentences, can lie down, no retractions, pulse < 100.    - MODERATE: SOB at rest, SOB with minimal exertion and prefers to sit, cannot lie  down flat, speaks in phrases, mild retractions, audible wheezing, pulse 100-120.    - SEVERE: Very SOB at rest, speaks in single words, struggling to breathe, sitting hunched forward, retractions, pulse > 120      Mild-Mod  8. CAUSE: "What do you think is causing the breathing problem?"      Illness 9. OTHER SYMPTOMS: "Do you have any other symptoms? (e.g., dizziness, runny nose, cough, chest pain, fever)     Weakness, cough, "rattle" 10. O2 SATURATION MONITOR:  "Do you use an oxygen saturation monitor (pulse oximeter) at home?" If Yes, ask: "What is your reading (oxygen level) today?" "What is your usual oxygen saturation reading?" (e.g., 95%)       92% at rest  Protocols used: Breathing Difficulty-A-AH

## 2023-09-12 NOTE — ED Triage Notes (Addendum)
 Pt states that he started with a cold last week that has cont to get worse, denies sob, reports nausea, family states that they found him wrapped up and wearing a winter coat that he slept in, crackling sounds when he breathes    Lisinopril  hydrochlorothiazide  10-12.5 Sertraline  100mg  Amlodipine  5mg  Metoprolol  100mg  Asa  All taken prior to coming to the ER

## 2023-09-12 NOTE — Telephone Encounter (Signed)
 FYI please see the attached triaged message

## 2023-09-12 NOTE — ED Notes (Signed)
 Patient transported to X-ray

## 2023-09-12 NOTE — Discharge Instructions (Signed)
 Take your antibiotics and steroids as directed.  Use the albuterol  inhaler as needed.  Return to the ER for new or worsening symptoms.  Otherwise, follow with your primary care doctor for further evaluation.  Please discuss with them if they recommend any further testing to evaluate for COPD.

## 2023-09-12 NOTE — Telephone Encounter (Signed)
 Can someone please reach out to offer this pt an appt with the 1st available provider to be evaluated.

## 2023-09-12 NOTE — Telephone Encounter (Signed)
 LVM..Called to see if Patient needed an appt with Dr. B or any other provider..It's ok for E2C2 to scheduled an office visit or Video Visit.

## 2023-09-12 NOTE — Telephone Encounter (Signed)
 FYI per the  message the pt is currently in the ED

## 2023-09-14 NOTE — Telephone Encounter (Signed)
 Please advise if you would like to see this pt for a HS follow up, the schedule is pushing pretty far out, if you agree is it ok for him to see the 1st available.

## 2023-09-21 ENCOUNTER — Ambulatory Visit (INDEPENDENT_AMBULATORY_CARE_PROVIDER_SITE_OTHER): Admitting: Internal Medicine

## 2023-09-21 ENCOUNTER — Other Ambulatory Visit: Payer: Self-pay | Admitting: Family Medicine

## 2023-09-21 ENCOUNTER — Encounter: Payer: Self-pay | Admitting: Internal Medicine

## 2023-09-21 VITALS — BP 132/84 | HR 63 | Temp 98.3°F | Resp 16 | Ht 67.0 in | Wt 211.8 lb

## 2023-09-21 DIAGNOSIS — J069 Acute upper respiratory infection, unspecified: Secondary | ICD-10-CM | POA: Diagnosis not present

## 2023-09-21 NOTE — Progress Notes (Signed)
 Acute Office Visit  Subjective:     Patient ID: Cameron Foster., male    DOB: Jul 16, 1940, 83 y.o.   MRN: [id number]  Chief Complaint  Patient presents with   Nasal Congestion    Runny nose, x3 weeks, was seen at ER   Cough   Fatigue    Cough Pertinent negatives include no chills, ear pain, fever, sore throat, shortness of breath or wheezing.   Patient is in today for congestion, rhinorrhea, cough. He is a [facility] patient and this is my first time meeting him.  Discussed the use of AI scribe software for clinical note transcription with the patient, who gave verbal consent to proceed.  History of Present Illness Cameron Foster. "Cameron Foster" is an 83 year old male who presents with persistent fatigue and respiratory symptoms following a viral illness.  He has experienced congestion, runny nose, and cough for three weeks. Symptoms have improved but mucus production persists. Mucus is not clear. He has a history of post-nasal drip and sinus issues.  Significant fatigue is present, with a lack of energy to eat and a need to rest frequently. No weight loss is noted.  He was tested for COVID in the ER, which was negative. He received a steroid, an antibiotic, and an inhaler, used two or three times. He uses Flonase  and a neti pot for sinus issues. Mucinex  is used for mucus, and he takes a multivitamin with vitamin C and zinc.  Previous labs showed elevated Kilroy blood cell count and low potassium. No chronic respiratory medications like those for COPD or asthma are used. No chronic respiratory diseases such as COPD or asthma.    Review of Systems  Constitutional:  Positive for malaise/fatigue. Negative for chills and fever.  HENT:  Positive for congestion. Negative for ear pain and sore throat.   Respiratory:  Positive for cough. Negative for shortness of breath and wheezing.         Objective:    BP 132/84 (Cuff Size: Large)   Pulse 63   Temp 98.3 F (36.8 C) (Oral)   Resp 16    Ht 5\' 7"  (1.702 m)   Wt 211 lb 12.8 oz (96.1 kg)   SpO2 97%   BMI 33.17 kg/m  BP Readings from Last 3 Encounters:  09/21/23 132/84  09/12/23 121/68  05/26/23 120/80   Wt Readings from Last 3 Encounters:  09/21/23 211 lb 12.8 oz (96.1 kg)  09/12/23 204 lb (92.5 kg)  05/31/23 200 lb (90.7 kg)      Physical Exam Constitutional:      Appearance: Normal appearance.  HENT:     Head: Normocephalic and atraumatic.     Right Ear: Tympanic membrane, ear canal and external ear normal.     Left Ear: Tympanic membrane, ear canal and external ear normal.     Nose: Nose normal.     Mouth/Throat:     Mouth: Mucous membranes are moist.     Pharynx: Oropharynx is clear.  Eyes:     Conjunctiva/sclera: Conjunctivae normal.  Cardiovascular:     Rate and Rhythm: Normal rate and regular rhythm.  Pulmonary:     Effort: Pulmonary effort is normal.     Breath sounds: Normal breath sounds. No wheezing, rhonchi or rales.  Skin:    General: Skin is warm and dry.  Neurological:     General: No focal deficit present.     Mental Status: He is alert. Mental status is at  baseline.  Psychiatric:        Mood and Affect: Mood normal.        Behavior: Behavior normal.     No results found for any visits on 09/21/23.      Assessment & Plan:   Assessment & Plan Viral Upper Respiratory Infection Symptoms consistent with viral URI, partially resolved. Fatigue likely post-viral. Possible viral etiology includes RSV, parainfluenza, or rhinovirus. Viral illnesses may take up to two weeks to improve. - Continue nasal steroid spray (Flonase ). - Perform sinus rinses with neti pot using distilled water . - Take OTC Mucinex . - Ensure adequate rest and hydration. - Consider zinc and vitamin C supplementation. - Contact PCP if symptoms worsen or persist beyond two to three weeks.  Post-Nasal Drip Chronic post-nasal drip contributing to mucus production and cough. Managed with nasal steroid spray. -  Continue nasal steroid spray (Flonase ). - Perform sinus rinses with neti pot using distilled water .    Return if symptoms worsen or fail to improve.  Cameron Cid, DO

## 2023-10-14 ENCOUNTER — Other Ambulatory Visit: Payer: Self-pay | Admitting: Family Medicine

## 2023-10-17 NOTE — Telephone Encounter (Signed)
 Requested Prescriptions  Pending Prescriptions Disp Refills   fluticasone  (FLONASE ) 50 MCG/ACT nasal spray [Pharmacy Med Name: FLUTICASONE  PROP 50 MCG SPRAY] 48 mL 1    Sig: PLACE 1 SPRAY INTO BOTH NOSTRILS DAILY AS NEEDED FOR ALLERGIES OR RHINITIS.     Ear, Nose, and Throat: Nasal Preparations - Corticosteroids Passed - 10/17/2023  7:37 AM      Passed - Valid encounter within last 12 months    Recent Outpatient Visits           3 weeks ago Viral upper respiratory tract infection   [facility] Bernardo Fend, [place]

## 2023-11-05 ENCOUNTER — Other Ambulatory Visit: Payer: Self-pay | Admitting: Family Medicine

## 2023-11-05 DIAGNOSIS — I110 Hypertensive heart disease with heart failure: Secondary | ICD-10-CM

## 2023-11-05 DIAGNOSIS — I10 Essential (primary) hypertension: Secondary | ICD-10-CM

## 2023-11-07 NOTE — Telephone Encounter (Signed)
 Courtesy refill. Patient will need an office visit for additional refills.  Requested Prescriptions  Pending Prescriptions Disp Refills   metoprolol  tartrate (LOPRESSOR ) 100 MG tablet [Pharmacy Med Name: METOPROLOL  TARTRATE 100 MG TAB] 60 tablet 0    Sig: TAKE 1 TABLET BY MOUTH TWICE A DAY     Cardiovascular:  Beta Blockers Passed - 11/07/2023 12:52 PM      Passed - Last BP in normal range    BP Readings from Last 1 Encounters:  09/21/23 132/84         Passed - Last Heart Rate in normal range    Pulse Readings from Last 1 Encounters:  09/21/23 63         Passed - Valid encounter within last 6 months    Recent Outpatient Visits           1 month ago Viral upper respiratory tract infection   [facility] F[facility], [place]               amLODipine  (NORVASC ) 5 MG tablet [Pharmacy Med Name: AMLODIPINE  BESYLATE 5 MG TAB] 30 tablet 0    Sig: TAKE 1 TABLET BY MOUTH EVERY DAY     Cardiovascular: Calcium  Channel Blockers 2 Passed - 11/07/2023 12:52 PM      Passed - Last BP in normal range    BP Readings from Last 1 Encounters:  09/21/23 132/84         Passed - Last Heart Rate in normal range    Pulse Readings from Last 1 Encounters:  09/21/23 63         Passed - Valid encounter within last 6 months    Recent Outpatient Visits           1 month ago Viral upper respiratory tract infection   [facility] Bernardo Fend, [place]

## 2023-11-21 ENCOUNTER — Telehealth: Payer: Self-pay

## 2023-11-21 ENCOUNTER — Encounter: Payer: Self-pay | Admitting: Family Medicine

## 2023-11-21 NOTE — Telephone Encounter (Signed)
 Message has been sent to the pt via his mychart

## 2023-11-27 ENCOUNTER — Encounter: Payer: Self-pay | Admitting: Family Medicine

## 2023-11-27 ENCOUNTER — Ambulatory Visit (INDEPENDENT_AMBULATORY_CARE_PROVIDER_SITE_OTHER): Admitting: Family Medicine

## 2023-11-27 VITALS — BP 98/60 | HR 68 | Temp 98.1°F | Ht 67.0 in | Wt 219.9 lb

## 2023-11-27 DIAGNOSIS — F4329 Adjustment disorder with other symptoms: Secondary | ICD-10-CM

## 2023-11-27 DIAGNOSIS — G479 Sleep disorder, unspecified: Secondary | ICD-10-CM | POA: Diagnosis not present

## 2023-11-27 DIAGNOSIS — I5032 Chronic diastolic (congestive) heart failure: Secondary | ICD-10-CM | POA: Diagnosis not present

## 2023-11-27 DIAGNOSIS — G629 Polyneuropathy, unspecified: Secondary | ICD-10-CM | POA: Diagnosis not present

## 2023-11-27 DIAGNOSIS — R5383 Other fatigue: Secondary | ICD-10-CM

## 2023-11-27 DIAGNOSIS — G4733 Obstructive sleep apnea (adult) (pediatric): Secondary | ICD-10-CM

## 2023-11-27 DIAGNOSIS — F320 Major depressive disorder, single episode, mild: Secondary | ICD-10-CM

## 2023-11-27 NOTE — Progress Notes (Addendum)
 Established patient visit   Patient: Cameron Foster.   DOB: 12-14-1940   [name]  MRN: [id number] Visit Date: 11/27/2023  Today's healthcare provider: LAURAINE LOISE BUOY, DO   Chief Complaint  Patient presents with   Insomnia    Onset x 1 year, states this started after patient's wife passed 1 year ago. Has problems falling asleep, states he has little energy    Fatigue    Patient reports low energy, has to force himself to get up and do things    Subjective    HPI Cameron Foster. Cameron Foster is an 83 year old male who presents with sleep disturbances and fatigue.  He has been experiencing sleep disturbances for about a year, which began after his wife's passing. He has difficulty falling asleep if he goes to bed early and often stays up until 10:30 or 11:00 PM watching TV before attempting to sleep. Despite eventually falling asleep, he struggles to wake up in the morning, often turning off his alarm and returning to sleep. No daytime napping is reported.  He uses a CPAP machine for sleep apnea but experiences irritation on his face and neck from the straps, which he attributes to previous shingles. He does not believe the CPAP significantly affects his ability to fall asleep.  He lacks physical activity, stating he does not engage in exercise despite owning an elliptical pedal device. He attends two Bible studies weekly and church on Sundays but reports difficulty walking due to fatigue and a lack of stamina, which he attributes to neuropathy in his feet and legs. He describes his legs as feeling tired and lacking blood flow, and reports these symptoms have been present for a long time.  He previously took pregabalin  but has stopped without a clear reason. He has a history of wearing a colostomy bag following a colonoscopy but it was reversed successfully.  Socially, he lives alone and struggles with household tasks, relying on a cleaning lady once a month. He has a supportive  sister in [place] with whom he communicates daily, but has what he reports as limited contact with his daughter, talking weekly on the phone. He feels fatigued and lacks motivation, often feeling sleepy and tired throughout the day. He worked night shifts in the past without problem but is having difficultyand has difficulty adjusting his sleep schedule.  No chest pain or shortness of breath. Reports fatigue, sleep disturbances, and difficulty waking up.      Medications: Outpatient Medications Prior to Visit  Medication Sig   albuterol  (VENTOLIN  HFA) 108 (90 Base) MCG/ACT inhaler Inhale 2 puffs into the lungs every 6 (six) hours as needed for wheezing or shortness of breath.   Alpha-Lipoic Acid 600 MG TABS Take 1 tablet by mouth daily.   amLODipine  (NORVASC ) 5 MG tablet TAKE 1 TABLET BY MOUTH EVERY DAY   aspirin  81 MG chewable tablet Chew 1 tablet (81 mg total) by mouth 2 (two) times daily.   atorvastatin  (LIPITOR) 40 MG tablet TAKE 1 TABLET BY MOUTH EVERY DAY   cetirizine  (ZYRTEC ) 10 MG tablet Take 10 mg by mouth daily.   Cholecalciferol (VITAMIN D-3) 25 MCG (1000 UT) CAPS Take 1,000 Units by mouth daily.   docusate sodium  (COLACE) 100 MG capsule Take 1 capsule (100 mg total) by mouth 2 (two) times daily. (Patient taking differently: Take 200 mg by mouth daily.)   fluticasone  (FLONASE ) 50 MCG/ACT nasal spray PLACE 1 SPRAY INTO BOTH NOSTRILS  DAILY AS NEEDED FOR ALLERGIES OR RHINITIS.   hydrocortisone  2.5 % cream APPLY TO AFFECTED AREA TWICE A DAY AS NEEDED FOR RASH   ketoconazole  (NIZORAL ) 2 % cream Apply once or twice daily to affected skin folds as needed for rash   lisinopril -hydrochlorothiazide  (ZESTORETIC ) 10-12.5 MG tablet Take 1 tablet by mouth daily.   metoprolol  tartrate (LOPRESSOR ) 100 MG tablet TAKE 1 TABLET BY MOUTH TWICE A DAY   Multiple Vitamin (MULTIVITAMIN WITH MINERALS) TABS tablet Take 1 tablet by mouth daily.   omeprazole  (PRILOSEC) 20 MG capsule TAKE 1 CAPSULE (20 MG  TOTAL) BY MOUTH DAILY. REPORTS CURRENTLY TAKING AS NEEDED   pregabalin  (LYRICA ) 25 MG capsule Take 25 mg by mouth 2 (two) times daily.   sertraline  (ZOLOFT ) 100 MG tablet Take 1 tablet (100 mg total) by mouth daily.   tadalafil  (CIALIS ) 5 MG tablet Take 1 tablet (5 mg total) by mouth daily as needed for erectile dysfunction.   thiamine  (VITAMIN B1) 100 MG tablet Take 100 mg by mouth daily.   vitamin B-12 (CYANOCOBALAMIN ) 1000 MCG tablet Take 1,000 mcg by mouth daily.   zinc gluconate 50 MG tablet Take 50 mg by mouth daily.   No facility-administered medications prior to visit.        Objective    BP 98/60   Pulse 68   Temp 98.1 F (36.7 C) (Oral)   Ht 5' 7 (1.702 m)   Wt 219 lb 14.4 oz (99.7 kg)   SpO2 97%   BMI 34.44 kg/m     Physical Exam Vitals and nursing note reviewed.  Constitutional:      General: He is not in acute distress.    Appearance: Normal appearance.  HENT:     Head: Normocephalic and atraumatic.  Eyes:     General: No scleral icterus.    Conjunctiva/sclera: Conjunctivae normal.  Cardiovascular:     Rate and Rhythm: Normal rate.  Pulmonary:     Effort: Pulmonary effort is normal.  Neurological:     Mental Status: He is alert and oriented to person, place, and time. Mental status is at baseline.  Psychiatric:        Mood and Affect: Mood normal.        Behavior: Behavior normal.      No results found for any visits on 11/27/23.  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 Previous therapy unhelpful.  Chronic insomnia and  fatigue, likely due to inactivity and irregular sleep. Possible bereavement-related depression. - Referred to psychology for therapy on life adjustment and expectation resetting. - Encouraged increased physical activity, including pedal bike and seated exercises. - Consider mirtazapine at night for sleep, cautioning about appetite increase. --- Patient and/or legal guardian verbally consented to [facility] services about presenting concerns and psychiatric consultation as appropriate. The services will be billed as appropriate for the patient   Heart failure with preserved ejection fraction Heart failure with preserved ejection fraction causing fatigue and exertional dyspnea.  Discussed that this contributes to patient's ongoing fatigue. - Follows with cardiology; defer to specialist management.  Peripheral  neuropathy Peripheral neuropathy likely causing walking difficulty and reduced stamina.  No changes. - Continue pregabalin .  Obstructive sleep apnea on CPAP Obstructive sleep apnea managed with CPAP. Reports mask strap irritation; denies significant sleep issues related to.  General Health Maintenance Discussed need for increased physical activity for health and sleep benefits. - Encouraged participation in physical activities through church or community programs.     Return if symptoms worsen or fail to improve.      I discussed the assessment and treatment plan with the patient  The patient was provided an opportunity to ask questions and all were answered. The patient agreed with the plan and demonstrated an understanding of the instructions.   The patient was advised to call back or seek an in-person evaluation if the symptoms worsen or if the condition fails to improve as anticipated.    Cameron LOISE BUOY, DO  [facility] [phone] (phone) [phone] (fax)  [facility]

## 2023-11-27 NOTE — Patient Instructions (Addendum)
 Follow-up with neurology, Dr. Maree.  Try to be more physically active during the day - use your pedal machine and do chair exercises.

## 2023-11-28 ENCOUNTER — Telehealth: Payer: Self-pay

## 2023-11-28 NOTE — Telephone Encounter (Signed)
 Copied from CRM #[id number]. Topic: General - Other >> Nov 28, 2023  2:12 PM Delon DASEN wrote: Reason for CRM: patient returning call from office-[phone]

## 2023-11-28 NOTE — Telephone Encounter (Signed)
 Spoke with E[facility] agent and gave her the number to [facility] for patient to call to schedule appointment.

## 2023-11-28 NOTE — Telephone Encounter (Signed)
 Copied from CRM [id number]. Topic: Referral - Request for Referral >> Nov 28, 2023 12:22 PM Zebedee SAUNDERS wrote: Did the patient discuss referral with their provider in the last year? Yes (If No - schedule appointment) (If Yes - send message)  Appointment offered? Yes  Type of order/referral and detailed reason for visit: Behavioral health, depression,   Preference of office, provider, location: [place] in [place]  If referral order, have you been seen by this specialty before? No (If Yes, this issue or another issue? When? Where?  Can we respond through MyChart? No

## 2023-11-28 NOTE — Telephone Encounter (Signed)
 Called pt to inform pt referral was placed yesterday and the referral coordinator would be in contact with pt to schedule an appt. No answer when called, left detail message and if any further questions to give us  a callback.

## 2023-11-29 NOTE — Telephone Encounter (Signed)
 Copied from CRM [id number]. Topic: Referral - Request for Referral >> Nov 28, 2023 12:22 PM Zebedee SAUNDERS wrote: Did the patient discuss referral with their provider in the last year? Yes (If No - schedule appointment) (If Yes - send message)  Appointment offered? Yes  Type of order/referral and detailed reason for visit: Behavioral health, depression,   Preference of office, provider, location: [place] in [place]  If referral order, have you been seen by this specialty before? No (If Yes, this issue or another issue? When? Where?  Can we respond through MyChart? No

## 2023-11-29 NOTE — Telephone Encounter (Signed)
 Copied from CRM [id number]. Topic: Referral - Request for Referral >> Nov 28, 2023  4:07 PM Antwanette L wrote: Pt does not want to got to [facility]. The office is too far and the next opening is in October . Please send the referral to an office in [place]. The patient can be contacted at [phone] >> Nov 28, 2023  4:00 PM Antwanette L wrote: Pt is calling back b/c he had a missed call from the office. Pt has questions about his referral.

## 2023-11-29 NOTE — Telephone Encounter (Unsigned)
 Copied from CRM [id number]. Topic: Referral - Request for Referral >> Nov 28, 2023 12:22 PM Zebedee SAUNDERS wrote: Did the patient discuss referral with their provider in the last year? Yes (If No - schedule appointment) (If Yes - send message)  Appointment offered? Yes  Type of order/referral and detailed reason for visit: Behavioral health, depression,   Preference of office, provider, location: [place] in [place]  If referral order, have you been seen by this specialty before? No (If Yes, this issue or another issue? When? Where?  Can we respond through MyChart? No >> Nov 29, 2023 11:09 AM Montie POUR wrote: [name] is calling back about referral. Please call Tom back today about he does not want to got to [facility]. The office is too far and the next opening is in October . Please send the referral to an office in [place]. The patient can be contacted at [phone]. Please leave a message is he does not answer. Thanks >> Nov 28, 2023  4:07 PM Antwanette L wrote: Pt does not want to got to [facility]. The office is too far and the next opening is in October . Please send the referral to an office in [place]. The patient can be contacted at [phone] >> Nov 28, 2023  4:00 PM Antwanette L wrote: Pt is calling back b/c he had a missed call from the office. Pt has questions about his referral.

## 2023-11-29 NOTE — Telephone Encounter (Unsigned)
 Copied from CRM [id number]. Topic: Referral - Request for Referral >> Nov 28, 2023 12:22 PM Zebedee SAUNDERS wrote: Did the patient discuss referral with their provider in the last year? Yes (If No - schedule appointment) (If Yes - send message)  Appointment offered? Yes  Type of order/referral and detailed reason for visit: Behavioral health, depression,   Preference of office, provider, location: [facility] in [place]  If referral order, have you been seen by this specialty before? No (If Yes, this issue or another issue? When? Where?  Can we respond through MyChart? No

## 2023-12-08 DIAGNOSIS — M2170 Unequal limb length (acquired), unspecified site: Secondary | ICD-10-CM | POA: Diagnosis not present

## 2023-12-08 DIAGNOSIS — M1712 Unilateral primary osteoarthritis, left knee: Secondary | ICD-10-CM | POA: Diagnosis not present

## 2023-12-10 ENCOUNTER — Other Ambulatory Visit: Payer: Self-pay | Admitting: Family Medicine

## 2023-12-10 DIAGNOSIS — I10 Essential (primary) hypertension: Secondary | ICD-10-CM

## 2023-12-11 ENCOUNTER — Other Ambulatory Visit: Payer: Self-pay | Admitting: Family Medicine

## 2023-12-11 ENCOUNTER — Ambulatory Visit: Admitting: Licensed Clinical Social Worker

## 2023-12-11 DIAGNOSIS — I110 Hypertensive heart disease with heart failure: Secondary | ICD-10-CM

## 2023-12-11 DIAGNOSIS — I10 Essential (primary) hypertension: Secondary | ICD-10-CM

## 2023-12-11 DIAGNOSIS — F4321 Adjustment disorder with depressed mood: Secondary | ICD-10-CM

## 2023-12-11 NOTE — Telephone Encounter (Signed)
 Requested Prescriptions  Pending Prescriptions Disp Refills   amLODipine  (NORVASC ) 5 MG tablet [Pharmacy Med Name: AMLODIPINE  BESYLATE 5 MG TAB] 90 tablet 1    Sig: TAKE 1 TABLET BY MOUTH EVERY DAY     Cardiovascular: Calcium  Channel Blockers 2 Passed - 12/11/2023  4:42 PM      Passed - Last BP in normal range    BP Readings from Last 1 Encounters:  11/27/23 98/60         Passed - Last Heart Rate in normal range    Pulse Readings from Last 1 Encounters:  11/27/23 68         Passed - Valid encounter within last 6 months    Recent Outpatient Visits           2 weeks ago Grief reaction with prolonged bereavement   [facility] [place], Lauraine SAILOR, DO   2 months ago Viral upper respiratory tract infection   [facility] Bernardo Fend, DO       Future Appointments             In 1 week Claudene Lehmann, MD [facility]
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
## 2023-12-11 NOTE — Patient Instructions (Signed)
 Using Behavioral Activation to manage stress/depression symptoms    Understand your own depression triggers.  Structure your day--get up around the same time, eat meals/snacks around the same time, go to bed around the same time.  Purposefully schedule self care time and time to complete tasks. This can include quiet time Stimulate your brain--go for a walk, text/call a friend or family member, if you are indoors--go outside (and vice versa), go for a drive, go to a store with bright colors and bright lights. Try to do things in a different way--drive to your favorite places using an alternative route, or instead of starting on the right side of the grocery store when shopping, start on the left side. You might feel a bit uncomfortable doing things outside of the comfort zone, but this is helping the brain create new neural pathways and is very healthy for brain/emotional health.  Physical movement based on your ability. If you can go for a walk, do stretches, even waving your hands to music can trigger feel-good endorphins in the brain and help release physical tension we all hold in our bodies.  Even 5 minutes can make a difference.  Be intentional about doing things that bring you joy (or used to bring you joy), and look for the things in every day that make you happy.  Seek those glimmers of joy each day. Set a timer for 5 minutes for a harder task (ex. Laundry, washing dishes).  Allow yourself to work distraction-free for 5 minutes, then stop when the timer goes off. If you need a break, take a break. If you want to continue working then set another timer for whatever time you choose.  Let in the light!! Open the window blinds, curtains and let natural light in. Even sitting near a window or sitting outside can boost your mood, especially in the wintertime when there is less daylight.    Things to envision for ourselves to to improve inspiration, motivation, and initiative :  improving  physical wellness, focus on family relationships, focusing on our own mental/emotional well being, being a part of a bigger community, finding a hobby, being a part of something that fosters personal growth, engaging socially with others (even digitally!)

## 2023-12-12 NOTE — Telephone Encounter (Signed)
 Requested medication (s) are due for refill today:   Yes  Requested medication (s) are on the active medication list:   Yes  Future visit scheduled:   No.   LOV 11/27/2023   Last ordered: 11/07/2023 #60, 0 refills  Returned because a 90 day supply and a DX Code being requested    Requested Prescriptions  Pending Prescriptions Disp Refills   metoprolol  tartrate (LOPRESSOR ) 100 MG tablet [Pharmacy Med Name: METOPROLOL  TARTRATE 100 MG TAB] 180 tablet 1    Sig: TAKE 1 TABLET BY MOUTH TWICE A DAY     Cardiovascular:  Beta Blockers Passed - 12/12/2023  2:11 PM      Passed - Last BP in normal range    BP Readings from Last 1 Encounters:  11/27/23 98/60         Passed - Last Heart Rate in normal range    Pulse Readings from Last 1 Encounters:  11/27/23 68         Passed - Valid encounter within last 6 months    Recent Outpatient Visits           2 weeks ago Grief reaction with prolonged bereavement   [facility] Maury, Lauraine SAILOR, DO   2 months ago Viral upper respiratory tract infection   [facility] Bernardo Fend, DO       Future Appointments             In 1 week Claudene Lehmann, MD [facility]

## 2023-12-13 ENCOUNTER — Telehealth (INDEPENDENT_AMBULATORY_CARE_PROVIDER_SITE_OTHER): Payer: Self-pay | Admitting: Licensed Clinical Social Worker

## 2023-12-13 DIAGNOSIS — F4321 Adjustment disorder with depressed mood: Secondary | ICD-10-CM | POA: Diagnosis not present

## 2023-12-13 NOTE — BH Specialist Note (Cosign Needed Addendum)
 Attestation signed by Warren Becker, PMHNP, DNP 12/13/2023 4:11 PM   Collaborative Care Psychiatric Consultant Case Review   Recommendation  1. Continuing collaborative care team medication management with possible psychiatric referral in future if limited progress noted.   2. BH specialist to follow up 3. Recommend returning the Zoloft  to 50 mg daily and starting Lexapro 5 mg daily for one week.  Then, decrease Zoloft  to 25 mg daily and increase Lexapro to 10 mg daily.  Discontinue Zoloft  on week 3 and continue Lexapro 10 mg daily. 4. Continue vitamin D supplementation.  Virtual Behavioral Health Treatment Plan Team Note  MRN: [id number] NAME: Cameron Foster.  DATE: 12/13/23  Start time: Start Time: 1615 End time: Stop Time: 1630 Total time: Total Time in Minutes (Visit): 15  Total number of Virtual BH Treatment Team Plan encounters: 1/4  Treatment Team Attendees: Sante Biedermann, LCSW and Sharlot Becker, DNP  Diagnoses:    ICD-10-CM   1. Adjustment disorder with depressed mood  F43.21       Goals, Interventions and Follow-up Plan Goals: Increase healthy adjustment to current life circumstances Decrease self-medicating behaviors Begin healthy grieving over loss Interventions: Motivational Interviewing Supportive Counseling Psychoeducation and/or Health Education  Medication Management Recommendations: Recommend returning the Zoloft  to 50 mg daily and starting Lexapro 5 mg daily for one week.  Then, decrease Zoloft  to 25 mg daily and increase Lexapro to 10 mg daily.  Discontinue Zoloft  on week 3 and continue Lexapro 10 mg daily.  Continue vitamin D supplementation.  Follow-up Plan: Pt to continue with IBH collaborative care counseling with medication management as needed  History of the present illness Presenting Problem/Current Symptoms:   Cameron Heupel. is a 83 y.o. male with history of grief/depression after the loss of his wife (4/25). Pt was married 60+ years.    Patient reports that he does drink alcohol on a daily basis as a way to cope with the loss.  Patient was seen in consultation at the request of Jon Eva MD for depression/grief and insomnia management.  Patient reports that he has low motivation, low initiative, low energy, and is staying in bed/sleeping too much.  Patient states that some days he really wants to cry, but feels that he cannot make himself cry.  Pt is currently taking the following psychiatric medications: Sertraline  100 mg.  Pt denies SI, HI, or AVH at time of session. Pt admits that he drinks alcohol on a daily basis--patient's drinks of choice are wine and whiskey.  Patient reports that he drinks 1 to 2 glasses of wine and multiple  sips of straight whiskey. Provided pt with psychoeducation re: aging and alcohol use.     Psychiatric History  Depression: Yes Anxiety: No Mania: No Psychosis: No PTSD symptoms: No  Past Psychiatric History/Hospitalization(s): Hospitalization for psychiatric illness: No Prior Suicide Attempts: No Prior Self-injurious behavior: No  Psychosocial stressors Flowsheet Row Integrated Behavioral Health from 12/11/2023 in [facility]  Current Stressors Grief/losses, Family illness, Family death, Other (Comment)  [sexual dysfunction]  Familial Stressors None  Sleep Increased  [hypersomnia some days]  Appetite No problems  Coping ability Overwhelmed  Patient taking medications as prescribed Yes    Self-harm Behaviors Risk Assessment Flowsheet Row Integrated Behavioral Health from 12/11/2023 in [facility]  Self-harm risk factors Social withdrawal/isolation  Have you recently had any thoughts about harming yourself? No    Screenings PHQ-9 Assessments:     12/11/2023  2:02 PM 11/27/2023    1:17 PM 09/21/2023    2:18 PM  Depression screen PHQ 2/9  Decreased Interest 2 3 0  Down, Depressed, Hopeless 1 0 0  PHQ - 2 Score 3 3 0   Altered sleeping 3 3   Tired, decreased energy 3 3   Change in appetite 0 0   Feeling bad or failure about yourself  1 2   Trouble concentrating 0 0   Moving slowly or fidgety/restless 0 0   Suicidal thoughts 0 0   PHQ-9 Score 10 11   Difficult doing work/chores Not difficult at all Somewhat difficult    GAD-7 Assessments:     12/11/2023    2:03 PM 11/27/2023    1:17 PM 03/27/2023    2:27 PM 03/06/2023    4:27 PM  GAD 7 : Generalized Anxiety Score  Nervous, Anxious, on Edge 1 0 1 1  Control/stop worrying 1 1 0 1  Worry too much - different things 1 1 0 1  Trouble relaxing 0 0 1 0  Restless 0 0 0 0  Easily annoyed or irritable 0 0 0 0  Afraid - awful might happen 0 0 0 0  Total GAD 7 Score 3 2 2 3   Anxiety Difficulty Not difficult at all Somewhat difficult Not difficult at all Not difficult at all    Past Medical History Past Medical History:  Diagnosis Date   Alcoholism [facility])    Allergy    Aortic atherosclerosis (HCC)    Arthritis    Ascending aorta dilation (HCC) 03/28/2018   a.) Vascular US : prox asc Ao measured 29 mm. b.)  CT CAP 06/22/2021: Ao root 41 mm. c.) TTE 06/26/2021: Ao root 41 mm; asc Ao 39 mm   Atrial fibrillation (HCC) 1995   a.) single episode in 1995 per patient; no long term treatment. b.) recurrent episode in the setting of GI bleeding related to colitis 06/2021.   Basal cell carcinoma 04/26/2017   Right medial cheek. Superficial and nodular   Basal cell carcinoma 06/11/2019   Left anterior shoulder. Nodular pattern   Basal cell carcinoma 09/09/2019   Right nasal ala, [facility]   Basal cell carcinoma 02/05/2020   L upper eyebrow, [facility]    Basal cell carcinoma 10/12/2022   left post auricular, [facility]   Basal cell carcinoma 05/02/2023   Right medial cheek, [facility]   Bilateral carotid artery stenosis 06/09/2019   a.) Carotid doppler: mod; <50% BILATERAL ICAs.   Cataract    Chicken pox    Colon cancer (HCC)    Colon polyp    Depression    Son died 25-Dec-2014    Diverticulitis    Diverticulosis 30 years   Gastritis    GERD (gastroesophageal reflux disease)    H. pylori infection    History of stress test    a. 09/2015 MV: No ischemia/infarct. EF 45-54% (nl by echo).   Hyperlipidemia    Hypertension    Irritable bowel syndrome    Lacunar infarction (HCC) 06/08/2019   a.) small; RIGHT motor strip   Neuromuscular disorder (HCC)    NSVT (nonsustained ventricular tachycardia) (HCC)    a.)  Single episode lasting 5 beats at a maximum rate of 160 bpm noted on Holter study performed 08/13/2021.   Prostate cancer [facility]) 25-Dec-2010   a.) s/p XRT   PSVT (paroxysmal supraventricular tachycardia) (HCC)    a. 06/2019 Zio: Avg rate 74 (54-120), occas PACs, rare PVCs, 125 episodes of PVCs (  longest 17.5 secs; max rate 187). No afib.   RBBB    SAH (subarachnoid hemorrhage) (HCC) 10/10/2018   Sleep apnea    Squamous cell carcinoma of skin 07/18/2017   Left medial calf. KA type   Squamous cell carcinoma of skin 04/24/2018   Right above med. brow   Squamous cell carcinoma of skin 06/11/2019   Right posterior shoulder. SCCis, hypertrophic   Squamous cell carcinoma of skin 01/09/2020   Mid nasal dorsum, MOHS, Efudex x 4wks   Substance abuse (HCC)    Systolic dysfunction    a.) TTE 10/05/2015: EF 50-55%; mild LVH; LAE; triv AR, mild MR. b.) TTE 03/29/2018: EF 55-60%; LAE, mild AR; ? small PFO. c.) TTE 06/09/2019: EF 55-60%, no rwma, triv MR/AI. d.) TTE 06/26/2021: EF 45-50%; glob HK; LAE; mild MR; Ao root 41 mm; asc Ao 39 mm.   Vasovagal syncope     Vital signs: There were no vitals filed for this visit.  Allergies:  Allergies as of 12/13/2023 - Review Complete 11/27/2023  Allergen Reaction Noted   Amoxicillin -pot clavulanate Other (See Comments) 01/03/2017   Formaldehyde Rash 01/20/2015    Medication History Current medications:  Outpatient Encounter Medications as of 12/13/2023  Medication Sig   albuterol  (VENTOLIN  HFA) 108 (90 Base) MCG/ACT inhaler Inhale  2 puffs into the lungs every 6 (six) hours as needed for wheezing or shortness of breath.   Alpha-Lipoic Acid 600 MG TABS Take 1 tablet by mouth daily.   amLODipine  (NORVASC ) 5 MG tablet TAKE 1 TABLET BY MOUTH EVERY DAY   aspirin  81 MG chewable tablet Chew 1 tablet (81 mg total) by mouth 2 (two) times daily.   atorvastatin  (LIPITOR) 40 MG tablet TAKE 1 TABLET BY MOUTH EVERY DAY   cetirizine  (ZYRTEC ) 10 MG tablet Take 10 mg by mouth daily.   Cholecalciferol (VITAMIN D-3) 25 MCG (1000 UT) CAPS Take 1,000 Units by mouth daily.   docusate sodium  (COLACE) 100 MG capsule Take 1 capsule (100 mg total) by mouth 2 (two) times daily. (Patient taking differently: Take 200 mg by mouth daily.)   fluticasone  (FLONASE ) 50 MCG/ACT nasal spray PLACE 1 SPRAY INTO BOTH NOSTRILS DAILY AS NEEDED FOR ALLERGIES OR RHINITIS.   hydrocortisone  2.5 % cream APPLY TO AFFECTED AREA TWICE A DAY AS NEEDED FOR RASH   ketoconazole  (NIZORAL ) 2 % cream Apply once or twice daily to affected skin folds as needed for rash   lisinopril -hydrochlorothiazide  (ZESTORETIC ) 10-12.5 MG tablet Take 1 tablet by mouth daily.   metoprolol  tartrate (LOPRESSOR ) 100 MG tablet TAKE 1 TABLET BY MOUTH TWICE A DAY   Multiple Vitamin (MULTIVITAMIN WITH MINERALS) TABS tablet Take 1 tablet by mouth daily.   omeprazole  (PRILOSEC) 20 MG capsule TAKE 1 CAPSULE (20 MG TOTAL) BY MOUTH DAILY. REPORTS CURRENTLY TAKING AS NEEDED   pregabalin  (LYRICA ) 25 MG capsule Take 25 mg by mouth 2 (two) times daily.   sertraline  (ZOLOFT ) 100 MG tablet Take 1 tablet (100 mg total) by mouth daily.   tadalafil  (CIALIS ) 5 MG tablet Take 1 tablet (5 mg total) by mouth daily as needed for erectile dysfunction.   thiamine  (VITAMIN B1) 100 MG tablet Take 100 mg by mouth daily.   vitamin B-12 (CYANOCOBALAMIN ) 1000 MCG tablet Take 1,000 mcg by mouth daily.   zinc gluconate 50 MG tablet Take 50 mg by mouth daily.   No facility-administered encounter medications on file as of  12/13/2023.     Scribe for Treatment Team: Tiawanna Luchsinger R Apphia Cropley, LCSW

## 2023-12-19 ENCOUNTER — Ambulatory Visit (INDEPENDENT_AMBULATORY_CARE_PROVIDER_SITE_OTHER): Admitting: Dermatology

## 2023-12-19 ENCOUNTER — Encounter: Payer: Self-pay | Admitting: Dermatology

## 2023-12-19 DIAGNOSIS — L821 Other seborrheic keratosis: Secondary | ICD-10-CM

## 2023-12-19 DIAGNOSIS — L814 Other melanin hyperpigmentation: Secondary | ICD-10-CM | POA: Diagnosis not present

## 2023-12-19 DIAGNOSIS — D1801 Hemangioma of skin and subcutaneous tissue: Secondary | ICD-10-CM

## 2023-12-19 DIAGNOSIS — Z1283 Encounter for screening for malignant neoplasm of skin: Secondary | ICD-10-CM

## 2023-12-19 DIAGNOSIS — W908XXA Exposure to other nonionizing radiation, initial encounter: Secondary | ICD-10-CM | POA: Diagnosis not present

## 2023-12-19 DIAGNOSIS — D229 Melanocytic nevi, unspecified: Secondary | ICD-10-CM

## 2023-12-19 DIAGNOSIS — L578 Other skin changes due to chronic exposure to nonionizing radiation: Secondary | ICD-10-CM

## 2023-12-19 DIAGNOSIS — D485 Neoplasm of uncertain behavior of skin: Secondary | ICD-10-CM

## 2023-12-19 NOTE — Patient Instructions (Addendum)

## 2023-12-19 NOTE — Progress Notes (Unsigned)
   Follow-Up Visit   Subjective  Cameron Foster. is a 83 y.o. male who presents for the following: Skin Cancer Screening and Upper Body Skin Exam, patient has several places of concern on her arms and back he would like checked today, hx of BCC, hx of SCC  The patient presents for Upper Body Skin Exam (UBSE) for skin cancer screening and mole check. The patient has spots, moles and lesions to be evaluated, some may be new or changing and the patient may have concern these could be cancer.    The following portions of the chart were reviewed this encounter and updated as appropriate: medications, allergies, medical history  Review of Systems:  No other skin or systemic complaints except as noted in HPI or Assessment and Plan.  Objective  Well appearing patient in no apparent distress; mood and affect are within normal limits.  All skin waist up examined. Relevant physical exam findings are noted in the Assessment and Plan.                        Assessment & Plan   MULTIPLE BENIGN NEVI   LENTIGINES   ACTINIC ELASTOSIS   SEBORRHEIC KERATOSES   CHERRY ANGIOMA   NEOPLASM OF UNCERTAIN BEHAVIOR OF SKIN   Skin cancer screening performed today.  Actinic Damage - Chronic condition, secondary to cumulative UV/sun exposure - diffuse scaly erythematous macules with underlying dyspigmentation - Recommend daily broad spectrum sunscreen SPF 30+ to sun-exposed areas, reapply every 2 hours as needed.  - Staying in the shade or wearing long sleeves, sun glasses (UVA+UVB protection) and wide brim hats (4-inch brim around the entire circumference of the hat) are also recommended for sun protection.  - Call for new or changing lesions.  Lentigines, Seborrheic Keratoses, Hemangiomas - Benign normal skin lesions - Benign-appearing - Call for any changes  Melanocytic Nevi - Tan-brown and/or pink-flesh-colored symmetric macules and papules - Benign appearing on exam  today - Observation - Call clinic for new or changing moles - Recommend daily use of broad spectrum spf 30+ sunscreen to sun-exposed areas.   Neoplasm of uncertain behaviour Several lesions concerning for BCC vs SCC vs ISK. Patient prefers to wait until next visit with Dr Jackquline. Circled and photographed lesions   Return for next scheduled follow up Dr Jackquline.  IFay Kirks, CMA, am acting as scribe for Boneta Sharps, MD .   Documentation: I have reviewed the above documentation for accuracy and completeness, and I agree with the above.  Boneta Sharps, MD

## 2023-12-20 ENCOUNTER — Other Ambulatory Visit: Payer: Self-pay

## 2023-12-20 ENCOUNTER — Inpatient Hospital Stay
Admission: EM | Admit: 2023-12-20 | Discharge: 2023-12-24 | DRG: 309 | Disposition: A | Discharge status: Home Health | Attending: Internal Medicine | Admitting: Internal Medicine

## 2023-12-20 ENCOUNTER — Emergency Department

## 2023-12-20 ENCOUNTER — Encounter: Payer: Self-pay | Admitting: Family Medicine

## 2023-12-20 DIAGNOSIS — I4891 Unspecified atrial fibrillation: Principal | ICD-10-CM | POA: Diagnosis present

## 2023-12-20 DIAGNOSIS — I130 Hypertensive heart and chronic kidney disease with heart failure and stage 1 through stage 4 chronic kidney disease, or unspecified chronic kidney disease: Secondary | ICD-10-CM | POA: Diagnosis present

## 2023-12-20 DIAGNOSIS — Z8673 Personal history of transient ischemic attack (TIA), and cerebral infarction without residual deficits: Secondary | ICD-10-CM

## 2023-12-20 DIAGNOSIS — E669 Obesity, unspecified: Secondary | ICD-10-CM | POA: Diagnosis present

## 2023-12-20 DIAGNOSIS — Z923 Personal history of irradiation: Secondary | ICD-10-CM

## 2023-12-20 DIAGNOSIS — G4733 Obstructive sleep apnea (adult) (pediatric): Secondary | ICD-10-CM | POA: Diagnosis present

## 2023-12-20 DIAGNOSIS — I5032 Chronic diastolic (congestive) heart failure: Secondary | ICD-10-CM | POA: Diagnosis present

## 2023-12-20 DIAGNOSIS — I4510 Unspecified right bundle-branch block: Secondary | ICD-10-CM | POA: Diagnosis present

## 2023-12-20 DIAGNOSIS — N179 Acute kidney failure, unspecified: Secondary | ICD-10-CM | POA: Diagnosis present

## 2023-12-20 DIAGNOSIS — F17220 Nicotine dependence, chewing tobacco, uncomplicated: Secondary | ICD-10-CM | POA: Diagnosis present

## 2023-12-20 DIAGNOSIS — Z888 Allergy status to other drugs, medicaments and biological substances status: Secondary | ICD-10-CM

## 2023-12-20 DIAGNOSIS — Z6831 Body mass index (BMI) 31.0-31.9, adult: Secondary | ICD-10-CM

## 2023-12-20 DIAGNOSIS — Z8546 Personal history of malignant neoplasm of prostate: Secondary | ICD-10-CM

## 2023-12-20 DIAGNOSIS — Z723 Lack of physical exercise: Secondary | ICD-10-CM

## 2023-12-20 DIAGNOSIS — E785 Hyperlipidemia, unspecified: Secondary | ICD-10-CM | POA: Diagnosis present

## 2023-12-20 DIAGNOSIS — K589 Irritable bowel syndrome without diarrhea: Secondary | ICD-10-CM | POA: Diagnosis present

## 2023-12-20 DIAGNOSIS — Z860100 Personal history of colon polyps, unspecified: Secondary | ICD-10-CM

## 2023-12-20 DIAGNOSIS — N1831 Chronic kidney disease, stage 3a: Secondary | ICD-10-CM | POA: Diagnosis present

## 2023-12-20 DIAGNOSIS — I10 Essential (primary) hypertension: Secondary | ICD-10-CM | POA: Diagnosis present

## 2023-12-20 DIAGNOSIS — I480 Paroxysmal atrial fibrillation: Secondary | ICD-10-CM | POA: Diagnosis not present

## 2023-12-20 DIAGNOSIS — Z85828 Personal history of other malignant neoplasm of skin: Secondary | ICD-10-CM

## 2023-12-20 DIAGNOSIS — Z85038 Personal history of other malignant neoplasm of large intestine: Secondary | ICD-10-CM

## 2023-12-20 DIAGNOSIS — Z96651 Presence of right artificial knee joint: Secondary | ICD-10-CM | POA: Diagnosis present

## 2023-12-20 DIAGNOSIS — F419 Anxiety disorder, unspecified: Secondary | ICD-10-CM | POA: Diagnosis present

## 2023-12-20 DIAGNOSIS — Z7982 Long term (current) use of aspirin: Secondary | ICD-10-CM

## 2023-12-20 DIAGNOSIS — R002 Palpitations: Secondary | ICD-10-CM | POA: Diagnosis not present

## 2023-12-20 DIAGNOSIS — K219 Gastro-esophageal reflux disease without esophagitis: Secondary | ICD-10-CM | POA: Diagnosis present

## 2023-12-20 DIAGNOSIS — F32A Depression, unspecified: Secondary | ICD-10-CM | POA: Diagnosis present

## 2023-12-20 DIAGNOSIS — Z96641 Presence of right artificial hip joint: Secondary | ICD-10-CM | POA: Diagnosis present

## 2023-12-20 DIAGNOSIS — Z9049 Acquired absence of other specified parts of digestive tract: Secondary | ICD-10-CM

## 2023-12-20 DIAGNOSIS — Z880 Allergy status to penicillin: Secondary | ICD-10-CM

## 2023-12-20 DIAGNOSIS — Z8719 Personal history of other diseases of the digestive system: Secondary | ICD-10-CM

## 2023-12-20 DIAGNOSIS — M1990 Unspecified osteoarthritis, unspecified site: Secondary | ICD-10-CM | POA: Diagnosis present

## 2023-12-20 DIAGNOSIS — I429 Cardiomyopathy, unspecified: Secondary | ICD-10-CM | POA: Diagnosis present

## 2023-12-20 DIAGNOSIS — Z8619 Personal history of other infectious and parasitic diseases: Secondary | ICD-10-CM

## 2023-12-20 DIAGNOSIS — Z79899 Other long term (current) drug therapy: Secondary | ICD-10-CM

## 2023-12-20 DIAGNOSIS — Z8679 Personal history of other diseases of the circulatory system: Secondary | ICD-10-CM

## 2023-12-20 DIAGNOSIS — Z634 Disappearance and death of family member: Secondary | ICD-10-CM

## 2023-12-20 DIAGNOSIS — E876 Hypokalemia: Secondary | ICD-10-CM | POA: Diagnosis present

## 2023-12-20 DIAGNOSIS — F1020 Alcohol dependence, uncomplicated: Secondary | ICD-10-CM | POA: Diagnosis present

## 2023-12-20 LAB — BASIC METABOLIC PANEL WITH GFR
Anion gap: 11 (ref 5–15)
BUN: 25 mg/dL — ABNORMAL HIGH (ref 8–23)
CO2: 23 mmol/L (ref 22–32)
Calcium: 9.3 mg/dL (ref 8.9–10.3)
Chloride: 104 mmol/L (ref 98–111)
Creatinine, Ser: 1.51 mg/dL — ABNORMAL HIGH (ref 0.61–1.24)
GFR, Estimated: 46 mL/min — ABNORMAL LOW (ref >60)
Glucose, Bld: 111 mg/dL — ABNORMAL HIGH (ref 70–99)
Potassium: 3.3 mmol/L — ABNORMAL LOW (ref 3.5–5.1)
Sodium: 138 mmol/L (ref 135–145)

## 2023-12-20 LAB — CBC
HCT: 37 % — ABNORMAL LOW (ref 39.0–52.0)
Hemoglobin: 12 g/dL — ABNORMAL LOW (ref 13.0–17.0)
MCH: 30.1 pg (ref 26.0–34.0)
MCHC: 32.4 g/dL (ref 30.0–36.0)
MCV: 92.7 fL (ref 80.0–100.0)
Platelets: 234 K/uL (ref 150–400)
RBC: 3.99 MIL/uL — ABNORMAL LOW (ref 4.22–5.81)
RDW: 13 % (ref 11.5–15.5)
WBC: 9.2 K/uL (ref 4.0–10.5)
nRBC: 0 % (ref 0.0–0.2)

## 2023-12-20 LAB — TSH: TSH: 3.798 u[IU]/mL (ref 0.350–4.500)

## 2023-12-20 LAB — MAGNESIUM: Magnesium: 2.2 mg/dL (ref 1.7–2.4)

## 2023-12-20 LAB — TROPONIN I (HIGH SENSITIVITY): Troponin I (High Sensitivity): 21 ng/L — ABNORMAL HIGH (ref <18)

## 2023-12-20 MED ORDER — SODIUM CHLORIDE 0.9 % IV BOLUS (SEPSIS)
500.0000 mL | Freq: Once | INTRAVENOUS | Status: AC
  Administered 2023-12-20: 500 mL via INTRAVENOUS

## 2023-12-20 MED ORDER — DILTIAZEM HCL-DEXTROSE 125-5 MG/125ML-% IV SOLN (PREMIX)
5.0000 mg/h | INTRAVENOUS | Status: DC
  Administered 2023-12-20: 5 mg/h via INTRAVENOUS
  Filled 2023-12-20: qty 125

## 2023-12-20 MED ORDER — POTASSIUM CHLORIDE CRYS ER 20 MEQ PO TBCR
40.0000 meq | EXTENDED_RELEASE_TABLET | Freq: Once | ORAL | Status: AC
Start: 2023-12-20 — End: 2023-12-20
  Administered 2023-12-20: 40 meq via ORAL
  Filled 2023-12-20: qty 2

## 2023-12-20 NOTE — ED Triage Notes (Signed)
 Pt reports he checked his HR tonight and found it to be elevated, pt has hx a fib. Pt denies chest pain. Pt having SOB with exertion

## 2023-12-20 NOTE — ED Notes (Signed)
 Pt taken back to room 5 via wheelchair by this EDT. Pt changed into gown and placed on 5 lead cardiac monitor. Pt bed locked and in lowest position. Fall risk bundle in place and call light within reach. No needs at this time.

## 2023-12-20 NOTE — ED Provider Notes (Signed)
 [facility] Provider Note    Event Date/Time   First MD Initiated Contact with Patient 12/20/23 27-Feb-2301     (approximate)   History   Tachycardia   HPI  Cameron Foster. is a 83 y.o. male with history of atrial fibrillation on metoprolol , not on anticoagulation secondary to GI bleed, SVT, CHF, hypertension, hyperlipidemia who presents to the emergency department palpitations that started today around 9 PM.  States he took his dose of metoprolol  at 9:30 PM.  No chest pain or shortness of breath.  No fevers, vomiting or diarrhea.  Heart rate in the 150s.  Cardiologist is Dr. Mady.  History provided by patient.    Past Medical History:  Diagnosis Date   Alcoholism [facility])    Allergy    Aortic atherosclerosis (HCC)    Arthritis    Ascending aorta dilation (HCC) 03/28/2018   a.) Vascular US : prox asc Ao measured 29 mm. b.)  CT CAP 06/22/2021: Ao root 41 mm. c.) TTE 06/26/2021: Ao root 41 mm; asc Ao 39 mm   Atrial fibrillation (HCC) 1995   a.) single episode in 1995 per patient; no long term treatment. b.) recurrent episode in the setting of GI bleeding related to colitis 06/2021.   Basal cell carcinoma 04/26/2017   Right medial cheek. Superficial and nodular   Basal cell carcinoma 06/11/2019   Left anterior shoulder. Nodular pattern   Basal cell carcinoma 09/09/2019   Right nasal ala, EDC   Basal cell carcinoma 02/05/2020   L upper eyebrow, EDC    Basal cell carcinoma 10/12/2022   left post auricular, EDC   Basal cell carcinoma 05/02/2023   Right medial cheek, EDC   Bilateral carotid artery stenosis 06/09/2019   a.) Carotid doppler: mod; <50% BILATERAL ICAs.   Cataract    Chicken pox    Colon cancer (HCC)    Colon polyp    Depression    Son died 02/28/15   Diverticulitis    Diverticulosis 30 years   Gastritis    GERD (gastroesophageal reflux disease)    H. pylori infection    History of stress test    a. 09/2015 MV: No ischemia/infarct. EF 45-54%  (nl by echo).   Hyperlipidemia    Hypertension    Irritable bowel syndrome    Lacunar infarction (HCC) 06/08/2019   a.) small; RIGHT motor strip   Neuromuscular disorder (HCC)    NSVT (nonsustained ventricular tachycardia) (HCC)    a.)  Single episode lasting 5 beats at a maximum rate of 160 bpm noted on Holter study performed 08/13/2021.   Prostate cancer [facility]) 02-28-2011   a.) s/p XRT   PSVT (paroxysmal supraventricular tachycardia) (HCC)    a. 06/2019 Zio: Avg rate 74 (54-120), occas PACs, rare PVCs, 125 episodes of PVCs (longest 17.5 secs; max rate 187). No afib.   RBBB    SAH (subarachnoid hemorrhage) (HCC) 10/10/2018   Sleep apnea    Squamous cell carcinoma of skin 07/18/2017   Left medial calf. KA type   Squamous cell carcinoma of skin 04/24/2018   Right above med. brow   Squamous cell carcinoma of skin 06/11/2019   Right posterior shoulder. SCCis, hypertrophic   Squamous cell carcinoma of skin 01/09/2020   Mid nasal dorsum, MOHS, Efudex x 4wks   Substance abuse (HCC)    Systolic dysfunction    a.) TTE 10/05/2015: EF 50-55%; mild LVH; LAE; triv AR, mild MR. b.) TTE 03/29/2018: EF 55-60%; LAE, mild AR; ?  small PFO. c.) TTE 06/09/2019: EF 55-60%, no rwma, triv MR/AI. d.) TTE 06/26/2021: EF 45-50%; glob HK; LAE; mild MR; Ao root 41 mm; asc Ao 39 mm.   Vasovagal syncope     Past Surgical History:  Procedure Laterality Date   CARDIAC CATHETERIZATION  1998   [place] no stents   CATARACT EXTRACTION, BILATERAL     COLON RESECTION SIGMOID N/A 12/07/2016   Procedure: COLON RESECTION SIGMOID;  Surgeon: Shelva Dunnings, MD;  Location: [facility] ORS;  Service: General;  Laterality: N/A;   COLON SURGERY  11/2016   Colostomy   COLONOSCOPY  2016   COLONOSCOPY WITH PROPOFOL  N/A 05/30/2016   Procedure: COLONOSCOPY WITH PROPOFOL ;  Surgeon: Gladis RAYMOND Mariner, MD;  Location: [facility] ENDOSCOPY;  Service: Endoscopy;  Laterality: N/A;   COLOSTOMY Left 12/07/2016   Procedure: COLOSTOMY;  Surgeon:  Shelva Dunnings, MD;  Location: [facility] ORS;  Service: General;  Laterality: Left;   COLOSTOMY REVERSAL N/A 03/21/2017   Procedure: COLOSTOMY REVERSAL;  Surgeon: Shelva Dunnings, MD;  Location: [facility] ORS;  Service: General;  Laterality: N/A;   COLOSTOMY TAKEDOWN N/A 03/21/2017   Procedure: LAPAROSCOPIC COLOSTOMY TAKEDOWN;  Surgeon: Shelva Dunnings, MD;  Location: [facility] ORS;  Service: General;  Laterality: N/A;   CYSTOSCOPY WITH STENT PLACEMENT Bilateral 03/21/2017   Procedure: CYSTOSCOPY WITH LIGHTED STENT PLACEMENT;  Surgeon: Twylla Glendia BROCKS, MD;  Location: [facility] ORS;  Service: Urology;  Laterality: Bilateral;   ESOPHAGOGASTRODUODENOSCOPY     ESOPHAGOGASTRODUODENOSCOPY N/A 05/30/2016   Procedure: ESOPHAGOGASTRODUODENOSCOPY (EGD);  Surgeon: Gladis RAYMOND Mariner, MD;  Location: [facility] ENDOSCOPY;  Service: Endoscopy;  Laterality: N/A;   ESOPHAGOGASTRODUODENOSCOPY N/A 01/14/2021   Procedure: ESOPHAGOGASTRODUODENOSCOPY (EGD);  Surgeon: Toledo, Ladell POUR, MD;  Location: [facility] ENDOSCOPY;  Service: Gastroenterology;  Laterality: N/A;   ESOPHAGOGASTRODUODENOSCOPY (EGD) WITH PROPOFOL  N/A 04/28/2021   Procedure: ESOPHAGOGASTRODUODENOSCOPY (EGD) WITH PROPOFOL ;  Surgeon: Toledo, Ladell POUR, MD;  Location: [facility] ENDOSCOPY;  Service: Gastroenterology;  Laterality: N/A;   EYE SURGERY     cataracts   FLEXIBLE SIGMOIDOSCOPY N/A 03/21/2017   Procedure: FLEXIBLE SIGMOIDOSCOPY;  Surgeon: Shelva Dunnings, MD;  Location: [facility] ORS;  Service: General;  Laterality: N/A;   FLEXIBLE SIGMOIDOSCOPY N/A 06/24/2021   Procedure: ENID MORIN;  Surgeon: Therisa Bi, MD;  Location: [facility] ENDOSCOPY;  Service: Gastroenterology;  Laterality: N/A;   FRACTURE SURGERY Bilateral    right arm and left wrist   INCISION AND DRAINAGE ABSCESS N/A 12/07/2016   Procedure: DRAINAGE  OF INTRA ABDOMINAL ABSCESS;  Surgeon: Shelva Dunnings, MD;  Location: [facility] ORS;  Service: General;  Laterality: N/A;   JOINT REPLACEMENT     LAPAROTOMY N/A  12/07/2016   Procedure: EXPLORATORY LAPAROTOMY;  Surgeon: Shelva Dunnings, MD;  Location: [facility] ORS;  Service: General;  Laterality: N/A;   PROSTATE SURGERY     Microwave therapy   TONSILLECTOMY     TOTAL HIP ARTHROPLASTY Right 09/27/2021   Procedure: TOTAL HIP ARTHROPLASTY ANTERIOR APPROACH;  Surgeon: Leora Lynwood SAUNDERS, MD;  Location: [facility] ORS;  Service: Orthopedics;  Laterality: Right;   TOTAL KNEE ARTHROPLASTY Right 07/27/2016   Procedure: TOTAL KNEE ARTHROPLASTY;  Surgeon: Kayla Pinal, MD;  Location: [facility] ORS;  Service: Orthopedics;  Laterality: Right;  Dr. Penne had to place Urinary catheter due to prostate cancer history.  Using flexible scope.    MEDICATIONS:  Prior to Admission medications   Medication Sig Start Date End Date Taking? Authorizing Provider  albuterol  (VENTOLIN  HFA) 108 (90 Base) MCG/ACT inhaler Inhale 2 puffs into the lungs every 6 (six) hours as  needed for wheezing or shortness of breath. 09/12/23   Levander Slate, MD  Alpha-Lipoic Acid 600 MG TABS Take 1 tablet by mouth daily.    [provider]  amLODipine  (NORVASC ) 5 MG tablet TAKE 1 TABLET BY MOUTH EVERY DAY 12/11/23   Bacigalupo, Jon [name], MD  aspirin  81 MG chewable tablet Chew 1 tablet (81 mg total) by mouth 2 (two) times daily. 09/30/21   Joshua Lin, PA-C  atorvastatin  (LIPITOR) 40 MG tablet TAKE 1 TABLET BY MOUTH EVERY DAY 01/02/23   Bacigalupo, Jon [name], MD  cetirizine  (ZYRTEC ) 10 MG tablet Take 10 mg by mouth daily.    [provider]  Cholecalciferol (VITAMIN D-3) 25 MCG (1000 UT) CAPS Take 1,000 Units by mouth daily.    [provider]  docusate sodium  (COLACE) 100 MG capsule Take 1 capsule (100 mg total) by mouth 2 (two) times daily. Patient taking differently: Take 200 mg by mouth daily. 09/30/21   Joshua Lin, PA-C  fluticasone  (FLONASE ) 50 MCG/ACT nasal spray PLACE 1 SPRAY INTO BOTH NOSTRILS DAILY AS NEEDED FOR ALLERGIES OR RHINITIS. 10/17/23   Myrla Jon [name], MD   hydrocortisone  2.5 % cream APPLY TO AFFECTED AREA TWICE A DAY AS NEEDED FOR RASH 02/20/23   Myrla Jon [name], MD  ketoconazole  (NIZORAL ) 2 % cream Apply once or twice daily to affected skin folds as needed for rash 05/02/23   Jackquline Sawyer, MD  lisinopril -hydrochlorothiazide  (ZESTORETIC ) 10-12.5 MG tablet Take 1 tablet by mouth daily. 02/27/23   Bacigalupo, Angela M, MD  metoprolol  tartrate (LOPRESSOR ) 100 MG tablet TAKE 1 TABLET BY MOUTH TWICE A DAY 12/12/23   Bacigalupo, Angela M, MD  Multiple Vitamin (MULTIVITAMIN WITH MINERALS) TABS tablet Take 1 tablet by mouth daily. 10/24/18   Vachhani, Vaibhavkumar, MD  omeprazole  (PRILOSEC) 20 MG capsule TAKE 1 CAPSULE (20 MG TOTAL) BY MOUTH DAILY. REPORTS CURRENTLY TAKING AS NEEDED 09/21/23   Myrla Jon [name], MD  pregabalin  (LYRICA ) 25 MG capsule Take 25 mg by mouth 2 (two) times daily.    [provider]  sertraline  (ZOLOFT ) 100 MG tablet Take 1 tablet (100 mg total) by mouth daily. 03/27/23   Myrla Jon [name], MD  tadalafil  (CIALIS ) 5 MG tablet Take 1 tablet (5 mg total) by mouth daily as needed for erectile dysfunction. 07/07/23   Bacigalupo, Angela M, MD  thiamine  (VITAMIN B1) 100 MG tablet Take 100 mg by mouth daily.    [provider]  vitamin B-12 (CYANOCOBALAMIN ) 1000 MCG tablet Take 1,000 mcg by mouth daily.    [provider]  zinc gluconate 50 MG tablet Take 50 mg by mouth daily.    [provider]    Physical Exam   Triage Vital Signs: ED Triage Vitals  Encounter Vitals Group     BP 12/20/23 2245 94/69     Girls Systolic BP Percentile --      Girls Diastolic BP Percentile --      Boys Systolic BP Percentile --      Boys Diastolic BP Percentile --      Pulse Rate 12/20/23 2245 (!) 150     Resp --      Temp 12/20/23 2245 98.3 F (36.8 C)     Temp Source 12/20/23 2245 Oral     SpO2 12/20/23 2245 100 %     Weight 12/20/23 2243 210 lb (95.3 kg)     Height 12/20/23 2243 5' 9 (1.753 m)     Head  Circumference --  Peak Flow --      Pain Score 12/20/23 2243 0     Pain Loc --      Pain Education --      Exclude from Growth Chart --     Most recent vital signs: Vitals:   12/21/23 0030 12/21/23 0040  BP: 107/81   Pulse: (!) 41 (!) 126  Resp: (!) 22 (!) 24  Temp:    SpO2: 95% 96%    CONSTITUTIONAL: Alert, responds appropriately to questions.  Elderly HEAD: Normocephalic, atraumatic EYES: Conjunctivae clear, pupils appear equal, sclera nonicteric ENT: normal nose; moist mucous membranes NECK: Supple, normal ROM CARD: Irregularly irregular and tachycardic; S1 and S2 appreciated RESP: Normal chest excursion without splinting or tachypnea; breath sounds clear and equal bilaterally; no wheezes, no rhonchi, no rales, no hypoxia or respiratory distress, speaking full sentences ABD/GI: Non-distended; soft, non-tender, no rebound, no guarding, no peritoneal signs BACK: The back appears normal EXT: Normal ROM in all joints; no deformity noted, no edema, no calf tenderness or calf swelling SKIN: Normal color for age and race; warm; no rash on exposed skin NEURO: Moves all extremities equally, normal speech PSYCH: The patient's mood and manner are appropriate.   ED Results / Procedures / Treatments   LABS: (all labs ordered are listed, but only abnormal results are displayed) Labs Reviewed  BASIC METABOLIC PANEL WITH GFR - Abnormal; Notable for the following components:      Result Value   Potassium 3.3 (*)    Glucose, Bld 111 (*)    BUN 25 (*)    Creatinine, Ser 1.51 (*)    GFR, Estimated 46 (*)    All other components within normal limits  CBC - Abnormal; Notable for the following components:   RBC 3.99 (*)    Hemoglobin 12.0 (*)    HCT 37.0 (*)    All other components within normal limits  TROPONIN I (HIGH SENSITIVITY) - Abnormal; Notable for the following components:   Troponin I (High Sensitivity) 21 (*)    All other components within normal limits  MAGNESIUM    TSH  TSH  LIPID PANEL  CBC  BASIC METABOLIC PANEL WITH GFR  TROPONIN I (HIGH SENSITIVITY)     EKG:  EKG Interpretation Date/Time:  Wednesday December 20 2023 22:45:05 EDT Ventricular Rate:  133 PR Interval:    QRS Duration:  130 QT Interval:  288 QTC Calculation: 428 R Axis:   -60  Text Interpretation: Atrial fibrillation with rapid ventricular response with premature ventricular or aberrantly conducted complexes Right bundle branch block Left anterior fascicular block Bifascicular block Minimal voltage criteria for LVH, may be normal variant ( R in aVL ) T wave abnormality, consider lateral ischemia Abnormal ECG When compared with ECG of 12-Sep-2023 11:18, Previous ECG has undetermined rhythm, needs review T wave amplitude has increased in Inferior leads T wave inversion more evident in Anterior leads Confirmed by Neomi Neptune [phone]) on 12/20/2023 11:03:29 PM         RADIOLOGY: My personal review and interpretation of imaging: Chest x-ray clear  I have personally reviewed all radiology reports.   DG Chest Port 1 View Result Date: 12/20/2023 CLINICAL DATA:  Tachycardia EXAM: PORTABLE CHEST 1 VIEW COMPARISON:  None Available. FINDINGS: Lungs are well expanded, symmetric, and clear. No pneumothorax or pleural effusion. Cardiac size within normal limits. Pulmonary vascularity is normal. Osseous structures are age-appropriate. No acute bone abnormality. IMPRESSION: 1. No active disease. Electronically Signed   By: Dorethia Molt  M.D.   On: 12/20/2023 23:54     PROCEDURES:  Critical Care performed: Yes, see critical care procedure note(s)   CRITICAL CARE Performed by: Josette Krisandra Bueno   Total critical care time: 30 minutes  Critical care time was exclusive of separately billable procedures and treating other patients.  Critical care was necessary to treat or prevent imminent or life-threatening deterioration.  Critical care was time spent personally by me on the following  activities: development of treatment plan with patient and/or surrogate as well as nursing, discussions with consultants, evaluation of patient's response to treatment, examination of patient, obtaining history from patient or surrogate, ordering and performing treatments and interventions, ordering and review of laboratory studies, ordering and review of radiographic studies, pulse oximetry and re-evaluation of patient's condition.   SABRA1-3 Lead EKG Interpretation  Performed by: Isabell Bonafede, Josette SAILOR, DO Authorized by: Kleber Crean, Josette SAILOR, DO     Interpretation: abnormal     ECG rate:  150   ECG rate assessment: tachycardic     Rhythm: atrial fibrillation     Ectopy: none     Conduction: normal       IMPRESSION / MDM / ASSESSMENT AND PLAN / ED COURSE  I reviewed the triage vital signs and the nursing notes.    Patient here with A-fib with RVR.  The patient is on the cardiac monitor to evaluate for evidence of arrhythmia and/or significant heart rate changes.   DIFFERENTIAL DIAGNOSIS (includes but not limited to):   A-fib with RVR, electrolyte derangement, anemia, thyroid  dysfunction, ACS, PE, CHF   Patient's presentation is most consistent with acute presentation with potential threat to life or bodily function.   PLAN: Will obtain labs, chest x-ray.  Will start on diltiazem  and give gentle IV fluids.   MEDICATIONS GIVEN IN ED: Medications  atorvastatin  (LIPITOR) tablet 40 mg (has no administration in time range)  acetaminophen  (TYLENOL ) tablet 650 mg (has no administration in time range)  ondansetron  (ZOFRAN ) injection 4 mg (has no administration in time range)  metoprolol  tartrate (LOPRESSOR ) injection 2.5 mg (has no administration in time range)  diltiazem  (CARDIZEM ) 125 mg in dextrose  5% 125 mL (1 mg/mL) infusion (has no administration in time range)  sodium chloride  0.9 % bolus 500 mL (0 mLs Intravenous Stopped 12/21/23 0019)  potassium chloride  SA (KLOR-CON  M) CR tablet 40 mEq  (40 mEq Oral Given 12/20/23 2335)     ED COURSE: Heart rate improving with diltiazem .  Blood pressure improving with IV fluids.  Hemoglobin of 12.  Potassium of 3.3.  Will give oral replacement.  Magnesium  normal.  TSH normal.  First troponin minimally elevated at 21.  Second pending.  Chest x-ray reviewed and interpreted by myself and the radiologist and is unremarkable.  Will discuss with the hospitalist for admission.   CONSULTS:  Consulted and discussed patient's case with hospitalist, Dr. Sim.  I have recommended admission and consulting physician agrees and will place admission orders.  Patient (and family if present) agree with this plan.   I reviewed all nursing notes, vitals, pertinent previous records.  All labs, EKGs, imaging ordered have been independently reviewed and interpreted by myself.    OUTSIDE RECORDS REVIEWED: Reviewed recent family medicine notes.       FINAL CLINICAL IMPRESSION(S) / ED DIAGNOSES   Final diagnoses:  Atrial fibrillation with RVR (HCC)     Rx / DC Orders   ED Discharge Orders     None        Note:  This document was prepared using Dragon voice recognition software and may include unintentional dictation errors.   Aneta Hendershott, Josette SAILOR, DO 12/21/23 [phone]

## 2023-12-21 DIAGNOSIS — I480 Paroxysmal atrial fibrillation: Secondary | ICD-10-CM

## 2023-12-21 DIAGNOSIS — I4891 Unspecified atrial fibrillation: Principal | ICD-10-CM | POA: Diagnosis present

## 2023-12-21 DIAGNOSIS — F1010 Alcohol abuse, uncomplicated: Secondary | ICD-10-CM

## 2023-12-21 DIAGNOSIS — N183 Chronic kidney disease, stage 3 unspecified: Secondary | ICD-10-CM

## 2023-12-21 DIAGNOSIS — I5030 Unspecified diastolic (congestive) heart failure: Secondary | ICD-10-CM | POA: Diagnosis not present

## 2023-12-21 DIAGNOSIS — Z7982 Long term (current) use of aspirin: Secondary | ICD-10-CM | POA: Diagnosis not present

## 2023-12-21 DIAGNOSIS — Z85038 Personal history of other malignant neoplasm of large intestine: Secondary | ICD-10-CM | POA: Diagnosis not present

## 2023-12-21 DIAGNOSIS — Z634 Disappearance and death of family member: Secondary | ICD-10-CM | POA: Diagnosis not present

## 2023-12-21 DIAGNOSIS — I130 Hypertensive heart and chronic kidney disease with heart failure and stage 1 through stage 4 chronic kidney disease, or unspecified chronic kidney disease: Secondary | ICD-10-CM | POA: Diagnosis not present

## 2023-12-21 DIAGNOSIS — Z96651 Presence of right artificial knee joint: Secondary | ICD-10-CM | POA: Diagnosis not present

## 2023-12-21 DIAGNOSIS — I10 Essential (primary) hypertension: Secondary | ICD-10-CM | POA: Diagnosis not present

## 2023-12-21 DIAGNOSIS — Z96641 Presence of right artificial hip joint: Secondary | ICD-10-CM | POA: Diagnosis not present

## 2023-12-21 DIAGNOSIS — E876 Hypokalemia: Secondary | ICD-10-CM | POA: Diagnosis present

## 2023-12-21 DIAGNOSIS — Z8719 Personal history of other diseases of the digestive system: Secondary | ICD-10-CM

## 2023-12-21 DIAGNOSIS — Z8679 Personal history of other diseases of the circulatory system: Secondary | ICD-10-CM | POA: Diagnosis not present

## 2023-12-21 DIAGNOSIS — Z85828 Personal history of other malignant neoplasm of skin: Secondary | ICD-10-CM | POA: Diagnosis not present

## 2023-12-21 DIAGNOSIS — E785 Hyperlipidemia, unspecified: Secondary | ICD-10-CM

## 2023-12-21 DIAGNOSIS — Z8546 Personal history of malignant neoplasm of prostate: Secondary | ICD-10-CM | POA: Diagnosis not present

## 2023-12-21 DIAGNOSIS — N179 Acute kidney failure, unspecified: Secondary | ICD-10-CM | POA: Diagnosis not present

## 2023-12-21 DIAGNOSIS — F419 Anxiety disorder, unspecified: Secondary | ICD-10-CM | POA: Diagnosis not present

## 2023-12-21 DIAGNOSIS — E669 Obesity, unspecified: Secondary | ICD-10-CM | POA: Diagnosis not present

## 2023-12-21 DIAGNOSIS — N1831 Chronic kidney disease, stage 3a: Secondary | ICD-10-CM | POA: Diagnosis not present

## 2023-12-21 DIAGNOSIS — F17220 Nicotine dependence, chewing tobacco, uncomplicated: Secondary | ICD-10-CM | POA: Diagnosis not present

## 2023-12-21 DIAGNOSIS — F1020 Alcohol dependence, uncomplicated: Secondary | ICD-10-CM | POA: Diagnosis not present

## 2023-12-21 DIAGNOSIS — I5032 Chronic diastolic (congestive) heart failure: Secondary | ICD-10-CM | POA: Diagnosis not present

## 2023-12-21 DIAGNOSIS — R002 Palpitations: Secondary | ICD-10-CM | POA: Diagnosis not present

## 2023-12-21 DIAGNOSIS — Z923 Personal history of irradiation: Secondary | ICD-10-CM | POA: Diagnosis not present

## 2023-12-21 DIAGNOSIS — Z6831 Body mass index (BMI) 31.0-31.9, adult: Secondary | ICD-10-CM | POA: Diagnosis not present

## 2023-12-21 DIAGNOSIS — I5020 Unspecified systolic (congestive) heart failure: Secondary | ICD-10-CM | POA: Diagnosis not present

## 2023-12-21 DIAGNOSIS — I429 Cardiomyopathy, unspecified: Secondary | ICD-10-CM | POA: Diagnosis not present

## 2023-12-21 DIAGNOSIS — F32A Depression, unspecified: Secondary | ICD-10-CM | POA: Diagnosis not present

## 2023-12-21 LAB — LIPID PANEL
Cholesterol: 154 mg/dL (ref 0–200)
HDL: 53 mg/dL (ref >40)
LDL Cholesterol: 68 mg/dL (ref 0–99)
Total CHOL/HDL Ratio: 2.9 ratio
Triglycerides: 167 mg/dL — ABNORMAL HIGH (ref <150)
VLDL: 33 mg/dL (ref 0–40)

## 2023-12-21 LAB — BASIC METABOLIC PANEL WITH GFR
Anion gap: 9 (ref 5–15)
BUN: 23 mg/dL (ref 8–23)
CO2: 26 mmol/L (ref 22–32)
Calcium: 9.3 mg/dL (ref 8.9–10.3)
Chloride: 104 mmol/L (ref 98–111)
Creatinine, Ser: 1.11 mg/dL (ref 0.61–1.24)
GFR, Estimated: >60 mL/min (ref >60)
Glucose, Bld: 105 mg/dL — ABNORMAL HIGH (ref 70–99)
Potassium: 3.9 mmol/L (ref 3.5–5.1)
Sodium: 139 mmol/L (ref 135–145)

## 2023-12-21 LAB — CBC
HCT: 38.8 % — ABNORMAL LOW (ref 39.0–52.0)
Hemoglobin: 12.4 g/dL — ABNORMAL LOW (ref 13.0–17.0)
MCH: 29.8 pg (ref 26.0–34.0)
MCHC: 32 g/dL (ref 30.0–36.0)
MCV: 93.3 fL (ref 80.0–100.0)
Platelets: 232 K/uL (ref 150–400)
RBC: 4.16 MIL/uL — ABNORMAL LOW (ref 4.22–5.81)
RDW: 13 % (ref 11.5–15.5)
WBC: 9.4 K/uL (ref 4.0–10.5)
nRBC: 0 % (ref 0.0–0.2)

## 2023-12-21 LAB — TSH: TSH: 4.023 u[IU]/mL (ref 0.350–4.500)

## 2023-12-21 LAB — APTT: aPTT: 32 s (ref 24–36)

## 2023-12-21 LAB — HEPARIN LEVEL (UNFRACTIONATED)
Heparin Unfractionated: 0.57 [IU]/mL (ref 0.30–0.70)
Heparin Unfractionated: 0.24 [IU]/mL — ABNORMAL LOW (ref 0.30–0.70)

## 2023-12-21 LAB — PROTIME-INR
INR: 1 (ref 0.8–1.2)
Prothrombin Time: 13.7 s (ref 11.4–15.2)

## 2023-12-21 LAB — TROPONIN I (HIGH SENSITIVITY): Troponin I (High Sensitivity): 50 ng/L — ABNORMAL HIGH (ref <18)

## 2023-12-21 MED ORDER — DILTIAZEM HCL-DEXTROSE 125-5 MG/125ML-% IV SOLN (PREMIX)
5.0000 mg/h | INTRAVENOUS | Status: DC
  Administered 2023-12-21: 15 mg/h via INTRAVENOUS
  Administered 2023-12-21 – 2023-12-22 (×2): 5 mg/h via INTRAVENOUS
  Filled 2023-12-21 (×3): qty 125

## 2023-12-21 MED ORDER — HEPARIN BOLUS VIA INFUSION
1400.0000 [IU] | Freq: Once | INTRAVENOUS | Status: AC
  Administered 2023-12-21: 1400 [IU] via INTRAVENOUS
  Filled 2023-12-21: qty 1400

## 2023-12-21 MED ORDER — LORAZEPAM 2 MG/ML IJ SOLN
1.0000 mg | INTRAMUSCULAR | Status: DC | PRN
  Administered 2023-12-22: 1 mg via INTRAVENOUS
  Filled 2023-12-21: qty 1

## 2023-12-21 MED ORDER — FOLIC ACID 1 MG PO TABS
1.0000 mg | ORAL_TABLET | Freq: Every day | ORAL | Status: DC
  Administered 2023-12-21 – 2023-12-24 (×4): 1 mg via ORAL
  Filled 2023-12-21 (×4): qty 1

## 2023-12-21 MED ORDER — METOPROLOL TARTRATE 5 MG/5ML IV SOLN
2.5000 mg | INTRAVENOUS | Status: DC
  Administered 2023-12-21: 2.5 mg via INTRAVENOUS
  Filled 2023-12-21: qty 5

## 2023-12-21 MED ORDER — THIAMINE MONONITRATE 100 MG PO TABS
100.0000 mg | ORAL_TABLET | Freq: Every day | ORAL | Status: DC
  Administered 2023-12-21 – 2023-12-24 (×4): 100 mg via ORAL
  Filled 2023-12-21 (×4): qty 1

## 2023-12-21 MED ORDER — ACETAMINOPHEN 325 MG PO TABS
650.0000 mg | ORAL_TABLET | ORAL | Status: DC | PRN
Start: 2023-12-21 — End: 2023-12-24

## 2023-12-21 MED ORDER — METOPROLOL TARTRATE 50 MG PO TABS
100.0000 mg | ORAL_TABLET | Freq: Two times a day (BID) | ORAL | Status: DC
  Administered 2023-12-21 – 2023-12-24 (×7): 100 mg via ORAL
  Filled 2023-12-21 (×8): qty 2

## 2023-12-21 MED ORDER — LORAZEPAM 1 MG PO TABS
1.0000 mg | ORAL_TABLET | ORAL | Status: DC | PRN
  Administered 2023-12-23: 2 mg via ORAL
  Filled 2023-12-21: qty 2

## 2023-12-21 MED ORDER — ADULT MULTIVITAMIN W/MINERALS CH
1.0000 | ORAL_TABLET | Freq: Every day | ORAL | Status: DC
  Administered 2023-12-21 – 2023-12-24 (×4): 1 via ORAL
  Filled 2023-12-21 (×4): qty 1

## 2023-12-21 MED ORDER — HEPARIN BOLUS VIA INFUSION
4500.0000 [IU] | Freq: Once | INTRAVENOUS | Status: AC
  Administered 2023-12-21: 4500 [IU] via INTRAVENOUS
  Filled 2023-12-21: qty 4500

## 2023-12-21 MED ORDER — ATORVASTATIN CALCIUM 20 MG PO TABS
40.0000 mg | ORAL_TABLET | Freq: Every day | ORAL | Status: DC
  Administered 2023-12-21 – 2023-12-24 (×4): 40 mg via ORAL
  Filled 2023-12-21 (×4): qty 2

## 2023-12-21 MED ORDER — ONDANSETRON HCL 4 MG/2ML IJ SOLN: 4.0000 mg | Freq: Four times a day (QID) | INTRAMUSCULAR | Status: DC | PRN

## 2023-12-21 MED ORDER — THIAMINE HCL 100 MG/ML IJ SOLN
100.0000 mg | Freq: Every day | INTRAMUSCULAR | Status: DC
  Filled 2023-12-21: qty 2

## 2023-12-21 MED ORDER — HEPARIN (PORCINE) 25000 UT/250ML-% IV SOLN
1550.0000 [IU]/h | INTRAVENOUS | Status: DC
  Administered 2023-12-21: 1350 [IU]/h via INTRAVENOUS
  Administered 2023-12-22: 1550 [IU]/h via INTRAVENOUS
  Filled 2023-12-21 (×3): qty 250

## 2023-12-21 NOTE — Assessment & Plan Note (Addendum)
 History of paroxysmal atrial fibrillation, remained in RVR. Electrolytes stable and TSH normal. - Cardiology switched Cardizem  infusion with p.o. -Continue with heparin  infusion for now-need to switch to Eliquis  before discharge -Continue with metoprolol  -Echocardiogram pending

## 2023-12-21 NOTE — Progress Notes (Signed)
 ANTICOAGULATION CONSULT NOTE  Pharmacy Consult for heparin  infusion Indication: atrial fibrillation  Allergies  Allergen Reactions   Amoxicillin -Pot Clavulanate Other (See Comments)    Abdominal upset  Has patient had a PCN reaction causing immediate rash, facial/tongue/throat swelling, SOB or lightheadedness with hypotension: Unknown  Has patient had a PCN reaction causing severe rash involving mucus membranes or skin necrosis: Unknown  Has patient had a PCN reaction that required hospitalization: Unknown  Has patient had a PCN reaction occurring within the last 10 years: Unknown  If all of the above answers are NO, then may proceed with Cephalosporin use.  amoxicillin  / clavulanate   Formaldehyde Rash    From shoes made with this material    Patient Measurements: Height: 5' 9 (175.3 cm) Weight: 95.3 kg (210 lb) IBW/kg (Calculated) : 70.7 HEPARIN  DW (KG): 90.4  Vital Signs: Temp: 97.8 F (36.6 C) (09/04 1727) Temp Source: Oral (09/04 1727) BP: 115/99 (09/04 1730) Pulse Rate: 61 (09/04 1730)  Labs: Recent Labs    12/20/23 2251 12/21/23 0050 12/21/23 0058 12/21/23 0935 12/21/23 1731  HGB 12.0* 12.4*  --   --   --   HCT 37.0* 38.8*  --   --   --   PLT 234 232  --   --   --   APTT  --   --  32  --   --   LABPROT  --   --  13.7  --   --   INR  --   --  1.0  --   --   HEPARINUNFRC  --   --   --  0.57 0.24*  CREATININE 1.51* 1.11  --   --   --   TROPONINIHS 21* 50*  --   --   --     Estimated Creatinine Clearance: 57.4 mL/min (by C-G formula based on SCr of 1.11 mg/dL).   Medical History: Past Medical History:  Diagnosis Date   Alcoholism (HCC)    Allergy    Aortic atherosclerosis (HCC)    Arthritis    Ascending aorta dilation (HCC) 03/28/2018   a.) Vascular US : prox asc Ao measured 29 mm. b.)  CT CAP 06/22/2021: Ao root 41 mm. c.) TTE 06/26/2021: Ao root 41 mm; asc Ao 39 mm   Atrial fibrillation (HCC) 1995   a.) single episode in 1995 per patient;  no long term treatment. b.) recurrent episode in the setting of GI bleeding related to colitis 06/2021.   Basal cell carcinoma 04/26/2017   Right medial cheek. Superficial and nodular   Basal cell carcinoma 06/11/2019   Left anterior shoulder. Nodular pattern   Basal cell carcinoma 09/09/2019   Right nasal ala, EDC   Basal cell carcinoma 02/05/2020   L upper eyebrow, EDC    Basal cell carcinoma 10/12/2022   left post auricular, EDC   Basal cell carcinoma 05/02/2023   Right medial cheek, EDC   Bilateral carotid artery stenosis 06/09/2019   a.) Carotid doppler: mod; <50% BILATERAL ICAs.   Cataract    Chicken pox    Colon cancer (HCC)    Colon polyp    Depression    Son died 09-Jan-2015   Diverticulitis    Diverticulosis 30 years   Gastritis    GERD (gastroesophageal reflux disease)    H. pylori infection    History of stress test    a. 09/2015 MV: No ischemia/infarct. EF 45-54% (nl by echo).   Hyperlipidemia    Hypertension  Irritable bowel syndrome    Lacunar infarction (HCC) 06/08/2019   a.) small; RIGHT motor strip   Neuromuscular disorder (HCC)    NSVT (nonsustained ventricular tachycardia) (HCC)    a.)  Single episode lasting 5 beats at a maximum rate of 160 bpm noted on Holter study performed 08/13/2021.   Prostate cancer [facility]) 2012   a.) s/p XRT   PSVT (paroxysmal supraventricular tachycardia) (HCC)    a. 06/2019 Zio: Avg rate 74 (54-120), occas PACs, rare PVCs, 125 episodes of PVCs (longest 17.5 secs; max rate 187). No afib.   RBBB    SAH (subarachnoid hemorrhage) (HCC) 10/10/2018   Sleep apnea    Squamous cell carcinoma of skin 07/18/2017   Left medial calf. KA type   Squamous cell carcinoma of skin 04/24/2018   Right above med. brow   Squamous cell carcinoma of skin 06/11/2019   Right posterior shoulder. SCCis, hypertrophic   Squamous cell carcinoma of skin 01/09/2020   Mid nasal dorsum, MOHS, Efudex x 4wks   Substance abuse (HCC)    Systolic dysfunction    a.)  TTE 10/05/2015: EF 50-55%; mild LVH; LAE; triv AR, mild MR. b.) TTE 03/29/2018: EF 55-60%; LAE, mild AR; ? small PFO. c.) TTE 06/09/2019: EF 55-60%, no rwma, triv MR/AI. d.) TTE 06/26/2021: EF 45-50%; glob HK; LAE; mild MR; Ao root 41 mm; asc Ao 39 mm.   Vasovagal syncope     Assessment: Pt is a 83 yo male with h/o a fib on metoprolol  presenting to ED c/o elevated HR and SOB with exertion.  Goal of Therapy:  Heparin  level 0.3-0.7 units/ml Monitor platelets by anticoagulation protocol: Yes   09/04 0935 HL 0.57, therapeutic x 1  Plan: heparin  level Subtherapeutic  Will order bolus 1400 units IV heparin  x 1 increase heparin  infusion rate to 1550 units/hr Recheck heparin  level in 8 hours after rate change CBC daily while on heparin   Adriana JONETTA Bolster, PharmD, BCPS 12/21/2023 6:23 PM

## 2023-12-21 NOTE — ED Notes (Signed)
 Called ccmd for pt monitoring

## 2023-12-21 NOTE — ED Notes (Signed)
 Fall bundle is in place at this time. Pt has call bell by bedside.

## 2023-12-21 NOTE — Assessment & Plan Note (Signed)
 Blood pressure mildly elevated. Home amlodipine  has been switched with Cardizem  Continue with metoprolol 

## 2023-12-21 NOTE — Progress Notes (Signed)
 Progress Note   Patient: Cameron Foster. FMW:[id number] DOB: 07-05-1940 DOA: 12/20/2023     0 DOS: the patient was seen and examined on 12/21/2023   Brief hospital course: Partly taken from H&P.   Cameron Foster. is a 83 y.o. male with medical history significant of remote history of alcoholism and tobacco abuse, paroxysmal atrial fibrillation, ascending aortic aneurysm, prior history of GI bleed, history of subarachnoid hemorrhage, basal cell carcinoma,Hyperlipidemia, essential hypertension, depression with anxiety, history of diastolic CHF with last EF of 55 to 60% in January 2024 who presents to the ER with palpitation.  Denied any chest pain.   On presentation patient was found to be in A-fib with RVR, heart rate in 160s, poor response to initial Cardizem  and beta-blocker bolus.  Labs with mild AKI with creatinine at 1.51, troponin 21, TSH 4.023, potassium 3.3, magnesium  2.2.  Patient was started on heparin  and Cardizem  infusion.  Cardiology was consulted.  Echocardiogram ordered.  9/4: Remained in RVR, heart rate in 120s, AKI resolved, potassium improved to 3.9, lipid panel mostly normal, triglyceride of 167. Cardiology transition Cardizem  infusion with p.o., echocardiogram pending. Patient will be started on Eliquis  before discharge.  Assessment and Plan: * Atrial fibrillation with RVR (HCC) History of paroxysmal atrial fibrillation, remained in RVR. Electrolytes stable and TSH normal. - Cardiology switched Cardizem  infusion with p.o. -Continue with heparin  infusion for now-need to switch to Eliquis  before discharge -Continue with metoprolol  -Echocardiogram pending  Essential hypertension Blood pressure mildly elevated. Home amlodipine  has been switched with Cardizem  Continue with metoprolol   History of GI bleed No history of recurrent colitis since 2023. No history of any recent bleed. - Continue to monitor as patient need anticoagulation now  History of subarachnoid  hemorrhage No acute concern.  Has history of remote subarachnoid hemorrhage.  No recurrent falls.  Hypokalemia Improved with repletion. - Continue to monitor and replete as needed  Hyperlipidemia LDL goal <70 - Continue with statin  Stage 3a chronic kidney disease (HCC) Improved and GFR now above 60. - Monitor renal function -Avoid nephrotoxins  Alcoholism (HCC) History of significant alcohol use on a daily basis. Patient uses this for anxiety and sleeping. - CIWA protocol -Thiamine  supplement -Counseling was provided   Subjective: Patient was seen and examined today.  Denies any chest pain or shortness of breath.  Had a quite remote history of subarachnoid hemorrhage, no recurrent GI bleed for a while.  Physical Exam: Vitals:   12/21/23 1100 12/21/23 1337 12/21/23 1530 12/21/23 1600  BP: 113/70  121/76 (!) 141/89  Pulse: 74  (!) 51 81  Resp: 20  (!) 21 (!) 24  Temp:  97.8 F (36.6 C)    TempSrc:  Oral    SpO2: 96%  95% 93%  Weight:      Height:       General.  Obese gentleman, in no acute distress. Pulmonary.  Lungs clear bilaterally, normal respiratory effort. CV.  Irregularly irregular Abdomen.  Soft, nontender, nondistended, BS positive. CNS.  Alert and oriented .  No focal neurologic deficit. Extremities.  Mild chronic lymphedema of right ankle, no edema on left Psychiatry.  Judgment and insight appears normal.   Data Reviewed: Prior data reviewed  Family Communication: Discussed with sister at bedside  Disposition: Status is: Inpatient Remains inpatient appropriate because: Severity of illness  Planned Discharge Destination: Home  DVT prophylaxis.  Heparin  infusion Time spent:  minutes  This record has been created using Conservation officer, historic buildings. Errors  have been sought and corrected,but may not always be located. Such creation errors do not reflect on the standard of care.   Author: Amaryllis Dare, MD 12/21/2023 5:15 PM  For on call review  [email or web address].

## 2023-12-21 NOTE — ED Notes (Signed)
 Notified MD ofSlow Ventricular Response pause 2.13 seconds

## 2023-12-21 NOTE — ED Notes (Signed)
 Pt assisted with urinal

## 2023-12-21 NOTE — Assessment & Plan Note (Signed)
Improved with repletion. -Continue to monitor and replete as needed

## 2023-12-21 NOTE — Progress Notes (Signed)
 ANTICOAGULATION CONSULT NOTE  Pharmacy Consult for heparin  infusion Indication: atrial fibrillation  Allergies  Allergen Reactions   Amoxicillin -Pot Clavulanate Other (See Comments)    Abdominal upset  Has patient had a PCN reaction causing immediate rash, facial/tongue/throat swelling, SOB or lightheadedness with hypotension: Unknown  Has patient had a PCN reaction causing severe rash involving mucus membranes or skin necrosis: Unknown  Has patient had a PCN reaction that required hospitalization: Unknown  Has patient had a PCN reaction occurring within the last 10 years: Unknown  If all of the above answers are NO, then may proceed with Cephalosporin use.  amoxicillin  / clavulanate   Formaldehyde Rash    From shoes made with this material    Patient Measurements: Height: 5' 9 (175.3 cm) Weight: 95.3 kg (210 lb) IBW/kg (Calculated) : 70.7 HEPARIN  DW (KG): 90.4  Vital Signs: Temp: 98.3 F (36.8 C) (09/03 2245) Temp Source: Oral (09/03 2245) BP: 107/81 (09/04 0030) Pulse Rate: 126 (09/04 0040)  Labs: Recent Labs    12/20/23 2251  HGB 12.0*  HCT 37.0*  PLT 234  CREATININE 1.51*  TROPONINIHS 21*    Estimated Creatinine Clearance: 42.2 mL/min (A) (by C-G formula based on SCr of 1.51 mg/dL (H)).   Medical History: Past Medical History:  Diagnosis Date   Alcoholism (HCC)    Allergy    Aortic atherosclerosis (HCC)    Arthritis    Ascending aorta dilation (HCC) 03/28/2018   a.) Vascular US : prox asc Ao measured 29 mm. b.)  CT CAP 06/22/2021: Ao root 41 mm. c.) TTE 06/26/2021: Ao root 41 mm; asc Ao 39 mm   Atrial fibrillation (HCC) 1995   a.) single episode in 1995 per patient; no long term treatment. b.) recurrent episode in the setting of GI bleeding related to colitis 06/2021.   Basal cell carcinoma 04/26/2017   Right medial cheek. Superficial and nodular   Basal cell carcinoma 06/11/2019   Left anterior shoulder. Nodular pattern   Basal cell carcinoma  09/09/2019   Right nasal ala, [facility]   Basal cell carcinoma 02/05/2020   L upper eyebrow, [facility]    Basal cell carcinoma 10/12/2022   left post auricular, [facility]   Basal cell carcinoma 05/02/2023   Right medial cheek, [facility]   Bilateral carotid artery stenosis 06/09/2019   a.) Carotid doppler: mod; <50% BILATERAL ICAs.   Cataract    Chicken pox    Colon cancer (HCC)    Colon polyp    Depression    Son died Jan 08, 2015   Diverticulitis    Diverticulosis 30 years   Gastritis    GERD (gastroesophageal reflux disease)    H. pylori infection    History of stress test    a. 09/2015 MV: No ischemia/infarct. EF 45-54% (nl by echo).   Hyperlipidemia    Hypertension    Irritable bowel syndrome    Lacunar infarction (HCC) 06/08/2019   a.) small; RIGHT motor strip   Neuromuscular disorder (HCC)    NSVT (nonsustained ventricular tachycardia) (HCC)    a.)  Single episode lasting 5 beats at a maximum rate of 160 bpm noted on Holter study performed 08/13/2021.   Prostate cancer [facility]) Jan 08, 2011   a.) s/p XRT   PSVT (paroxysmal supraventricular tachycardia) (HCC)    a. 06/2019 Zio: Avg rate 74 (54-120), occas PACs, rare PVCs, 125 episodes of PVCs (longest 17.5 secs; max rate 187). No afib.   RBBB    SAH (subarachnoid hemorrhage) (HCC) 10/10/2018   Sleep apnea  Squamous cell carcinoma of skin 07/18/2017   Left medial calf. KA type   Squamous cell carcinoma of skin 04/24/2018   Right above med. brow   Squamous cell carcinoma of skin 06/11/2019   Right posterior shoulder. SCCis, hypertrophic   Squamous cell carcinoma of skin 01/09/2020   Mid nasal dorsum, MOHS, Efudex x 4wks   Substance abuse (HCC)    Systolic dysfunction    a.) TTE 10/05/2015: EF 50-55%; mild LVH; LAE; triv AR, mild MR. b.) TTE 03/29/2018: EF 55-60%; LAE, mild AR; ? small PFO. c.) TTE 06/09/2019: EF 55-60%, no rwma, triv MR/AI. d.) TTE 06/26/2021: EF 45-50%; glob HK; LAE; mild MR; Ao root 41 mm; asc Ao 39 mm.   Vasovagal syncope      Assessment: Pt is a 83 yo male with h/o a fib on metoprolol  presenting to ED c/o elevated HR and SOB with exertion.  Goal of Therapy:  Heparin  level 0.3-0.7 units/ml Monitor platelets by anticoagulation protocol: Yes   Plan:  Bolus 4500 units x 1 Start heparin  infusion at 1350 units/hr Will check HL in 8 hr after start of infusion CBC daily while on heparin   Rankin CANDIE Dills, PharmD, [facility] 12/21/2023 12:59 AM

## 2023-12-21 NOTE — Assessment & Plan Note (Signed)
 No acute concern.  Has history of remote subarachnoid hemorrhage.  No recurrent falls.

## 2023-12-21 NOTE — ED Notes (Signed)
 Notified MD that lab reached out saying Troponin has gone up from 21 to 50

## 2023-12-21 NOTE — Hospital Course (Addendum)
 Partly taken from H&P.   Cameron Foster. is a 83 y.o. male with medical history significant of remote history of alcoholism and tobacco abuse, paroxysmal atrial fibrillation, ascending aortic aneurysm, prior history of GI bleed, history of subarachnoid hemorrhage, basal cell carcinoma,Hyperlipidemia, essential hypertension, depression with anxiety, history of diastolic CHF with last EF of 55 to 60% in January 2024 who presents to the ER with palpitation.  Denied any chest pain.   On presentation patient was found to be in A-fib with RVR, heart rate in 160s, poor response to initial Cardizem  and beta-blocker bolus.  Labs with mild AKI with creatinine at 1.51, troponin 21, TSH 4.023, potassium 3.3, magnesium  2.2.  Patient was started on heparin  and Cardizem  infusion.  Cardiology was consulted.  Echocardiogram ordered.  9/4: Remained in RVR, heart rate in 120s, AKI resolved, potassium improved to 3.9, lipid panel mostly normal, triglyceride of 167.  9/5: Rate improved, now mostly in 90s, echocardiogram with EF of 55 to 60%, no RWMA and indeterminate diastolic parameters. Cardizem  infusion was switched with p.o. Cardizem  and heparin  infusion is being switched with Eliquis .  9/6: Patient initially with some concern of bradycardia and sinus pauses, blood pressure was soft-Cardizem  dose was held, later heart rate and blood pressure both improved and cardiology decreased the p.o. Cardizem  dose to 120 mg instead of 240 mg and would like to keep him for another day. Remained in A-fib, cardiology will likely do outpatient cardioversion in 2 to 3 weeks if remained in A-fib.  9/7: Patient remained hemodynamically stable and heart rate remained well-controlled, still in A-fib.  He is being discharged on Cardizem  120 mg and metoprolol  100 mg twice daily.  Also started on Eliquis  and home aspirin  was discontinued.  Home antihypertensives were discontinued as blood pressure seems controlled with current  regimen.  Patient might need outpatient cardioversion if remained in A-fib. He needed close monitoring by primary care provider and cardiology for further assistance.  They can add further antihypertensives as needed as outpatient.

## 2023-12-21 NOTE — Assessment & Plan Note (Signed)
 Improved and GFR now above 60. - Monitor renal function -Avoid nephrotoxins

## 2023-12-21 NOTE — Consult Note (Signed)
 Cardiology Consultation   Patient ID: Cameron Foster. MRN: [id number]; DOB: [birth year]  Admit date: 12/20/2023 Date of Consult: 12/21/2023  PCP:  Cameron Jon [name], MD   [facility] Providers Cardiologist:  None      Patient Profile: Cameron Foster. is a 83 y.o. male with a hx of chronic HFmrEF, Pafib not on a/c due to GIB, HTN, HLD, OSA, CVA, SAH, CKD stage 3, tobacco use, colitis with GIB, prostate cancer, alcohol use who is being seen 12/21/2023 for the evaluation of afib  at the request of Dr. Caleen.  History of Present Illness: Mr. Cameron Foster was aditted March 2023 with left-sided ischemic colitis, ABLA. He was found to have Afib RVR. Echo showed LVEF 45-50%. He was palced on IV amio with conversion to NSR. He was placed on Eliquis .   He was seen in the office 07/22/21 for Foster follow-up and voiced concerns about continuing a/c given bleeding issues. Heart monitor showed no recurrent Afib and a/c was stopped.   The patient presented to the ER for palpitations. They started last night around dinner time. He went to sit down and felt funny. He placed the pulse OX and noted heart rate was high. BP cuff said the same thing. He called his sister, whose a nurse, and she told him to come to the ER.   In the ER he was found to have afib with RVR, Hr 160s. He was started on IV Cardizem . BP was low 94/69, afberile and 100% O2. K 3.3, mid AKI, Scr 1.51. HS troponin 21>50. Mag 2.2. CXR negative.   Past Medical History:  Diagnosis Date   Alcoholism [employer] [facility] [employer])    Allergy    Aortic atherosclerosis (HCC)    Arthritis    Ascending aorta dilation (HCC) 03/28/2018   a.) Vascular US : prox asc Ao measured 29 mm. b.)  CT CAP 06/22/2021: Ao root 41 mm. c.) TTE 06/26/2021: Ao root 41 mm; asc Ao 39 mm   Atrial fibrillation (HCC) 1995   a.) single episode in 1995 per patient; no long term treatment. b.) recurrent episode in the setting of GI bleeding related to colitis 06/2021.   Basal cell  carcinoma 04/26/2017   Right medial cheek. Superficial and nodular   Basal cell carcinoma 06/11/2019   Left anterior shoulder. Nodular pattern   Basal cell carcinoma 09/09/2019   Right nasal ala, [facility]   Basal cell carcinoma 02/05/2020   L upper eyebrow, [facility]    Basal cell carcinoma 10/12/2022   left post auricular, [facility]   Basal cell carcinoma 05/02/2023   Right medial cheek, [facility]   Bilateral carotid artery stenosis 06/09/2019   a.) Carotid doppler: mod; <50% BILATERAL ICAs.   Cataract    Chicken pox    Colon cancer (HCC)    Colon polyp    Depression    Son died 2015/01/16   Diverticulitis    Diverticulosis 30 years   Gastritis    GERD (gastroesophageal reflux disease)    H. pylori infection    History of stress test    a. 09/2015 MV: No ischemia/infarct. EF 45-54% (nl by echo).   Hyperlipidemia    Hypertension    Irritable bowel syndrome    Lacunar infarction (HCC) 06/08/2019   a.) small; RIGHT motor strip   Neuromuscular disorder (HCC)    NSVT (nonsustained ventricular tachycardia) (HCC)    a.)  Single episode lasting 5 beats at a maximum rate of 160 bpm noted on Holter study performed  08/13/2021.   Prostate cancer [facility]) 2012   a.) s/p XRT   PSVT (paroxysmal supraventricular tachycardia) (HCC)    a. 06/2019 Zio: Avg rate 74 (54-120), occas PACs, rare PVCs, 125 episodes of PVCs (longest 17.5 secs; max rate 187). No afib.   RBBB    SAH (subarachnoid hemorrhage) (HCC) 10/10/2018   Sleep apnea    Squamous cell carcinoma of skin 07/18/2017   Left medial calf. KA type   Squamous cell carcinoma of skin 04/24/2018   Right above med. brow   Squamous cell carcinoma of skin 06/11/2019   Right posterior shoulder. SCCis, hypertrophic   Squamous cell carcinoma of skin 01/09/2020   Mid nasal dorsum, MOHS, Efudex x 4wks   Substance abuse (HCC)    Systolic dysfunction    a.) TTE 10/05/2015: EF 50-55%; mild LVH; LAE; triv AR, mild MR. b.) TTE 03/29/2018: EF 55-60%; LAE, mild AR; ? small PFO.  c.) TTE 06/09/2019: EF 55-60%, no rwma, triv MR/AI. d.) TTE 06/26/2021: EF 45-50%; glob HK; LAE; mild MR; Ao root 41 mm; asc Ao 39 mm.   Vasovagal syncope     Past Surgical History:  Procedure Laterality Date   CARDIAC CATHETERIZATION  1998   [place] no stents   CATARACT EXTRACTION, BILATERAL     COLON RESECTION SIGMOID N/A 12/07/2016   Procedure: COLON RESECTION SIGMOID;  Surgeon: Cameron Dunnings, MD;  Location: [facility] ORS;  Service: General;  Laterality: N/A;   COLON SURGERY  11/2016   Colostomy   COLONOSCOPY  2016   COLONOSCOPY WITH PROPOFOL  N/A 05/30/2016   Procedure: COLONOSCOPY WITH PROPOFOL ;  Surgeon: Cameron RAYMOND Mariner, MD;  Location: [facility] ENDOSCOPY;  Service: Endoscopy;  Laterality: N/A;   COLOSTOMY Left 12/07/2016   Procedure: COLOSTOMY;  Surgeon: Cameron Dunnings, MD;  Location: [facility] ORS;  Service: General;  Laterality: Left;   COLOSTOMY REVERSAL N/A 03/21/2017   Procedure: COLOSTOMY REVERSAL;  Surgeon: Cameron Dunnings, MD;  Location: [facility] ORS;  Service: General;  Laterality: N/A;   COLOSTOMY TAKEDOWN N/A 03/21/2017   Procedure: LAPAROSCOPIC COLOSTOMY TAKEDOWN;  Surgeon: Cameron Dunnings, MD;  Location: [facility] ORS;  Service: General;  Laterality: N/A;   CYSTOSCOPY WITH STENT PLACEMENT Bilateral 03/21/2017   Procedure: CYSTOSCOPY WITH LIGHTED STENT PLACEMENT;  Surgeon: Cameron Glendia BROCKS, MD;  Location: [facility] ORS;  Service: Urology;  Laterality: Bilateral;   ESOPHAGOGASTRODUODENOSCOPY     ESOPHAGOGASTRODUODENOSCOPY N/A 05/30/2016   Procedure: ESOPHAGOGASTRODUODENOSCOPY (EGD);  Surgeon: Cameron RAYMOND Mariner, MD;  Location: [facility] ENDOSCOPY;  Service: Endoscopy;  Laterality: N/A;   ESOPHAGOGASTRODUODENOSCOPY N/A 01/14/2021   Procedure: ESOPHAGOGASTRODUODENOSCOPY (EGD);  Surgeon: Cameron Foster, Cameron POUR, MD;  Location: [facility] ENDOSCOPY;  Service: Gastroenterology;  Laterality: N/A;   ESOPHAGOGASTRODUODENOSCOPY (EGD) WITH PROPOFOL  N/A 04/28/2021   Procedure: ESOPHAGOGASTRODUODENOSCOPY (EGD)  WITH PROPOFOL ;  Surgeon: Cameron Foster, Cameron POUR, MD;  Location: [facility] ENDOSCOPY;  Service: Gastroenterology;  Laterality: N/A;   EYE SURGERY     cataracts   FLEXIBLE SIGMOIDOSCOPY N/A 03/21/2017   Procedure: FLEXIBLE SIGMOIDOSCOPY;  Surgeon: Cameron Dunnings, MD;  Location: [facility] ORS;  Service: General;  Laterality: N/A;   FLEXIBLE SIGMOIDOSCOPY N/A 06/24/2021   Procedure: ENID MORIN;  Surgeon: Therisa Bi, MD;  Location: [facility] ENDOSCOPY;  Service: Gastroenterology;  Laterality: N/A;   FRACTURE SURGERY Bilateral    right arm and left wrist   INCISION AND DRAINAGE ABSCESS N/A 12/07/2016   Procedure: DRAINAGE  OF INTRA ABDOMINAL ABSCESS;  Surgeon: Cameron Dunnings, MD;  Location: [facility] ORS;  Service: General;  Laterality: N/A;   JOINT REPLACEMENT  LAPAROTOMY N/A 12/07/2016   Procedure: EXPLORATORY LAPAROTOMY;  Surgeon: Cameron Dunnings, MD;  Location: [facility] ORS;  Service: General;  Laterality: N/A;   PROSTATE SURGERY     Microwave therapy   TONSILLECTOMY     TOTAL HIP ARTHROPLASTY Right 09/27/2021   Procedure: TOTAL HIP ARTHROPLASTY ANTERIOR APPROACH;  Surgeon: Leora Lynwood SAUNDERS, MD;  Location: [facility] ORS;  Service: Orthopedics;  Laterality: Right;   TOTAL KNEE ARTHROPLASTY Right 07/27/2016   Procedure: TOTAL KNEE ARTHROPLASTY;  Surgeon: Kayla Pinal, MD;  Location: [facility] ORS;  Service: Orthopedics;  Laterality: Right;  Dr. Penne had to place Urinary catheter due to prostate cancer history.  Using flexible scope.     Home Medications:  Prior to Admission medications   Medication Sig Start Date End Date Taking? Authorizing Provider  albuterol  (VENTOLIN  HFA) 108 (90 Base) MCG/ACT inhaler Inhale 2 puffs into the lungs every 6 (six) hours as needed for wheezing or shortness of breath. 09/12/23   Levander Slate, MD  Alpha-Lipoic Acid 600 MG TABS Take 1 tablet by mouth daily.    [provider]  amLODipine  (NORVASC ) 5 MG tablet TAKE 1 TABLET BY MOUTH EVERY DAY 12/11/23   Bacigalupo, Jon [name], MD  aspirin  81 MG chewable tablet Chew 1 tablet (81 mg total) by mouth 2 (two) times daily. 09/30/21   Joshua Lin, PA-C  atorvastatin  (LIPITOR) 40 MG tablet TAKE 1 TABLET BY MOUTH EVERY DAY 01/02/23   Bacigalupo, Angela M, MD  cetirizine  (ZYRTEC ) 10 MG tablet Take 10 mg by mouth daily.    [provider]  Cholecalciferol (VITAMIN D-3) 25 MCG (1000 UT) CAPS Take 1,000 Units by mouth daily.    [provider]  docusate sodium  (COLACE) 100 MG capsule Take 1 capsule (100 mg total) by mouth 2 (two) times daily. Patient taking differently: Take 200 mg by mouth daily. 09/30/21   Joshua Lin, PA-C  fluticasone  (FLONASE ) 50 MCG/ACT nasal spray PLACE 1 SPRAY INTO BOTH NOSTRILS DAILY AS NEEDED FOR ALLERGIES OR RHINITIS. 10/17/23   Cameron Jon [name], MD  hydrocortisone  2.5 % cream APPLY TO AFFECTED AREA TWICE A DAY AS NEEDED FOR RASH 02/20/23   Cameron Jon [name], MD  ketoconazole  (NIZORAL ) 2 % cream Apply once or twice daily to affected skin folds as needed for rash 05/02/23   Stewart, Tara, MD  lisinopril -hydrochlorothiazide  (ZESTORETIC ) 10-12.5 MG tablet Take 1 tablet by mouth daily. 02/27/23   Bacigalupo, Angela M, MD  metoprolol  tartrate (LOPRESSOR ) 100 MG tablet TAKE 1 TABLET BY MOUTH TWICE A DAY 12/12/23   Bacigalupo, Angela M, MD  Multiple Vitamin (MULTIVITAMIN WITH MINERALS) TABS tablet Take 1 tablet by mouth daily. 10/24/18   Vachhani, Vaibhavkumar, MD  omeprazole  (PRILOSEC) 20 MG capsule TAKE 1 CAPSULE (20 MG TOTAL) BY MOUTH DAILY. REPORTS CURRENTLY TAKING AS NEEDED 09/21/23   Cameron Jon [name], MD  pregabalin  (LYRICA ) 25 MG capsule Take 25 mg by mouth 2 (two) times daily.    [provider]  sertraline  (ZOLOFT ) 100 MG tablet Take 1 tablet (100 mg total) by mouth daily. 03/27/23   Cameron Jon [name], MD  tadalafil  (CIALIS ) 5 MG tablet Take 1 tablet (5 mg total) by mouth daily as needed for erectile dysfunction. 07/07/23   Bacigalupo, Angela M, MD  thiamine  (VITAMIN  B1) 100 MG tablet Take 100 mg by mouth daily.    [provider]  vitamin B-12 (CYANOCOBALAMIN ) 1000 MCG tablet Take 1,000 mcg by mouth daily.    [provider]  zinc gluconate  50 MG tablet Take 50 mg by mouth daily.    [provider]    Scheduled Meds:  atorvastatin   40 mg Oral Daily   metoprolol  tartrate  100 mg Oral BID   Continuous Infusions:  diltiazem  (CARDIZEM ) infusion 10 mg/hr (12/21/23 0848)   heparin  1,350 Units/hr (12/21/23 0149)   PRN Meds: acetaminophen , ondansetron  (ZOFRAN ) IV  Allergies:    Allergies  Allergen Reactions   Amoxicillin -Pot Clavulanate Other (See Comments)    Abdominal upset  Has patient had a PCN reaction causing immediate rash, facial/tongue/throat swelling, SOB or lightheadedness with hypotension: Unknown  Has patient had a PCN reaction causing severe rash involving mucus membranes or skin necrosis: Unknown  Has patient had a PCN reaction that required hospitalization: Unknown  Has patient had a PCN reaction occurring within the last 10 years: Unknown  If all of the above answers are NO, then may proceed with Cephalosporin use.  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  Tobacco Use   Smoking status: Former    Current packs/day: 0.00    Average packs/day: 1 pack/day for 27.0 years (27.0 ttl pk-yrs)    Types: Cigarettes    Start date: 04/18/1961    Quit date: 04/18/1988    Years since quitting: 35.6   Smokeless tobacco: Current    Types: Chew    Last attempt to quit: 12/20/2021  Vaping Use   Vaping status: Never Used  Substance and Sexual Activity   Alcohol use: Yes     Alcohol/week: 7.0 standard drinks of alcohol    Types: 7 Glasses of wine per week    Comment: 1 glass per day-Former heavy use ETOH   Drug use: No   Sexual activity: Not Currently    Birth control/protection: None    Comment: Married  Other Topics Concern   Not on file  Social History Narrative   1 son deceased, 1 daughter living   Social Drivers of Corporate investment banker Strain: Low Risk  (11/24/2023)   Overall Financial Resource Strain (CARDIA)    Difficulty of Paying Living Expenses: Not very hard  Food Insecurity: No Food Insecurity (11/24/2023)   Hunger Vital Sign    Worried About Running Out of Food in the Last Year: Never true    Ran Out of Food in the Last Year: Never true  Transportation Needs: No Transportation Needs (11/24/2023)   PRAPARE - Administrator, Civil Service (Medical): No    Lack of Transportation (Non-Medical): No  Physical Activity: Inactive (11/24/2023)   Exercise Vital Sign    Days of Exercise per Week: 0 days    Minutes of Exercise per Session: Not on file  Stress: Stress Concern Present (11/24/2023)   [employer] of Occupational Health - Occupational Stress Questionnaire    Feeling of Stress: To some extent  Social Connections: Moderately Integrated (11/24/2023)   Social Connection and Isolation Panel    Frequency of Communication with Friends and Family: More than three times a week    Frequency of Social Gatherings with Friends and Family: Twice a week  Attends Religious Services: More than 4 times per year    Active Member of Clubs or Organizations: Yes    Attends Banker Meetings: More than 4 times per year    Marital Status: Widowed  Intimate Partner Violence: Not At Risk (05/31/2023)   Humiliation, Afraid, Rape, and Kick questionnaire    Fear of Current or Ex-Partner: No    Emotionally Abused: No    Physically Abused: No    Sexually Abused: No    Family History:    Family History  Problem Relation Age of  Onset   Lung cancer Father        smoker   Cancer Father    Other Mother    Vision loss Mother    Sudden death Son        due to Blood clots   Bipolar disorder Son    Heart disease Son    Early death Son    Learning disabilities Son    Kidney disease Daughter        congenital one small kidney   Varicose Veins Sister    Prostate cancer Neg Hx    Bladder Cancer Neg Hx      ROS:  Please see the history of present illness.   All other ROS reviewed and negative.     Physical Exam/Data: Vitals:   12/21/23 0830 12/21/23 0845 12/21/23 0900 12/21/23 0930  BP: 131/85  118/86 106/80  Pulse: 67 (!) 128 66 68  Resp: 20 20 13 18   Temp:      TempSrc:      SpO2: 97% 96% 96% 96%  Weight:      Height:        Intake/Output Summary (Last 24 hours) at 12/21/2023 1002 Last data filed at 12/21/2023 0049 Gross per 24 hour  Intake 509.97 ml  Output --  Net 509.97 ml      12/20/2023   10:43 PM 11/27/2023    1:16 PM 09/21/2023    2:20 PM  Last 3 Weights  Weight (lbs) 210 lb 219 lb 14.4 oz 211 lb 12.8 oz  Weight (kg) 95.255 kg 99.746 kg 96.072 kg     Body mass index is 31.01 kg/m.  General:  Well nourished, well developed, in no acute distress HEENT: normal Neck: no JVD Vascular: No carotid bruits; Distal pulses 2+ bilaterally Cardiac:  normal S1, S2; Irreg IRreg; no murmur  Lungs:  clear to auscultation bilaterally, no wheezing, rhonchi or rales  Abd: soft, nontender, no hepatomegaly  Ext: no edema Musculoskeletal:  No deformities, BUE and BLE strength normal and equal Skin: warm and dry  Neuro:  CNs 2-12 intact, no focal abnormalities noted Psych:  Normal affect   EKG:  The EKG was personally reviewed and demonstrates:  Afib 133bpm, PVC, RBBB, LAFB Telemetry:  Telemetry was personally reviewed and demonstrates:  Afib HR 70-80s  Relevant CV Studies:  Echo 2024  1. Left ventricular ejection fraction, by estimation, is 55 to 60%. The  left ventricle has normal function. The  left ventricle has no regional  wall motion abnormalities. Left ventricular diastolic parameters are  consistent with Grade II diastolic  dysfunction (pseudonormalization). The average left ventricular global  longitudinal strain is -19.4 %. The global longitudinal strain is normal.   2. Right ventricular systolic function is normal. The right ventricular  size is normal.   3. The mitral valve is normal in structure. No evidence of mitral valve  regurgitation.   4. The aortic  valve is tricuspid. Aortic valve regurgitation is trivial.   Heart monitor 08/2021  The patient was monitored for 13 days, 19 hours. The predominant rhythm was sinus with an average rate of 63 bpm (range 50-102 bpm in sinus). There were rare PACs and PVCs. One episode of nonsustained ventricular tachycardia was observed, lasting 5 beats with a maximum rate of 160 bpm. There were 78 atrial runs lasting up to 15 beats with a maximum rate of 171 bpm. No sustained arrhythmia (including atrial fibrillation/flutter) or prolonged pause was observed. Patient triggered event corresponds to sinus rhythm.   Predominantly sinus rhythm with rare PACs and PVCs.  Multiple episodes of brief PSVT noted as well as one short run of NSVT.  No atrial fibrillation/flutter identified.  Echo 06/2021 1. Left ventricular ejection fraction, by estimation, is 45 to 50%. The  left ventricle has mildly decreased function. The left ventricle  demonstrates global hypokinesis. There is moderate concentric left  ventricular hypertrophy. Left ventricular  diastolic parameters are indeterminate. Elevated left ventricular  end-diastolic pressure.   2. Right ventricular systolic function is normal. The right ventricular  size is normal. There is normal pulmonary artery systolic pressure.   3. Left atrial size was mildly dilated.   4. The mitral valve is normal in structure. Mild mitral valve  regurgitation. No evidence of mitral stenosis.   5. The  aortic valve is tricuspid. There is mild calcification of the  aortic valve. Aortic valve regurgitation is not visualized. No aortic  stenosis is present.   6. Aortic dilatation noted. There is mild dilatation of the aortic root,  measuring 41 mm. There is mild dilatation of the ascending aorta,  measuring 39 mm.   7. The inferior vena cava is dilated in size with <50% respiratory  variability, suggesting right atrial pressure of 15 mmHg.   Echo 2021   1. Left ventricular ejection fraction, by estimation, is 55 to 60%. The  left ventricle has normal function. The left ventricle has no regional  wall motion abnormalities. Left ventricular diastolic parameters were  normal.   2. Right ventricular systolic function is normal. The right ventricular  size is normal.   3. The mitral valve is normal in structure and function. Trivial mitral  valve regurgitation.   4. The aortic valve is normal in structure and function. Aortic valve  regurgitation is not visualized.   Laboratory Data: High Sensitivity Troponin:   Recent Labs  Lab 12/20/23 2251 12/21/23 0050  TROPONINIHS 21* 50*     Chemistry Recent Labs  Lab 12/20/23 2251 12/21/23 0050  NA 138 139  K 3.3* 3.9  CL 104 104  CO2 23 26  GLUCOSE 111* 105*  BUN 25* 23  CREATININE 1.51* 1.11  CALCIUM  9.3 9.3  MG 2.2  --   GFRNONAA 46* >60  ANIONGAP 11 9    No results for input(s): PROT, ALBUMIN , AST, ALT, ALKPHOS, BILITOT in the last 168 hours. Lipids  Recent Labs  Lab 12/21/23 0050  CHOL 154  TRIG 167*  HDL 53  LDLCALC 68  CHOLHDL 2.9    Hematology Recent Labs  Lab 12/20/23 2251 12/21/23 0050  WBC 9.2 9.4  RBC 3.99* 4.16*  HGB 12.0* 12.4*  HCT 37.0* 38.8*  MCV 92.7 93.3  MCH 30.1 29.8  MCHC 32.4 32.0  RDW 13.0 13.0  PLT 234 232   Thyroid   Recent Labs  Lab 12/21/23 0050  TSH 4.023    BNPNo results for input(s): BNP, PROBNP in the  last 168 hours.  DDimer No results for input(s):  DDIMER in the last 168 hours.  Radiology/Studies:  DG Chest Port 1 View Result Date: 12/20/2023 CLINICAL DATA:  Tachycardia EXAM: PORTABLE CHEST 1 VIEW COMPARISON:  None Available. FINDINGS: Lungs are well expanded, symmetric, and clear. No pneumothorax or pleural effusion. Cardiac size within normal limits. Pulmonary vascularity is normal. Osseous structures are age-appropriate. No acute bone abnormality. IMPRESSION: 1. No active disease. Electronically Signed   By: Dorethia Molt M.D.   On: 12/20/2023 23:54     Assessment and Plan:  Paroxysmal Afib - h/o afib in 2023 during hospitalization for colitis and GIB, he was initially started on Eliquis , but this was stopped due to GIB and no recurrent afib - he presented with palpitations found to be in rapid Afib started on IV dilt - he remaines in Afib with rates 70-80s - he is asymptomatic - keep Mag>2 and K>4 - CAHDSVASC (agex2, CHF, HTN, PAD) at least 5 - will defer a/c to primary cardiologist - update echo - IV cardizem  5mg /hr and lopressor  100mg  BID - if he does not self convert may need DCCV, but then would require a/c for a time  Alcohol use - he drinks at least 2 drink daily - cessation recommended  HFmrEF - EF was low in 2023 in the setting of Afib - repeat echo in 2024 showed normal LVEF - patient appears euvolemic - repeat echo  CKD stage 3 - Scr at baseline  HTN - BP soft with cardizem  and lopressor    For questions or updates, please contact [facility] Please consult [email or web address] for contact info under    Signed, Jourden Gilson VEAR Fishman, PA-C  12/21/2023 10:02 AM

## 2023-12-21 NOTE — H&P (Signed)
 History and Physical    Patient: Cameron Foster. FMW:[id number] DOB: May 08, 1940 DOA: 12/20/2023 DOS: the patient was seen and examined on 12/21/2023 PCP: Myrla Jon [name], MD  Patient coming from: Home  Chief Complaint:  Chief Complaint  Patient presents with   Tachycardia   HPI: Cameron Blank. is a 83 y.o. male with medical history significant of remote history of alcoholism and tobacco abuse, prior history of atrial fibrillation nonsustained blacking the 90s, ascending aortic aneurysm, prior history of GI bleed, history of subarachnoid hemorrhage, basal cell carcinoma,Hyperlipidemia, essential hypertension, depression with anxiety, history of diastolic CHF with last EF of 55 to 60% in January 2024 who presents to the ER with palpitation.  Denied any chest pain.  Denied any nausea vomiting or diarrhea.  Patient is on metoprolol  with rate control but not on anticoagulation.  Was seen in the ER and found to have atrial fibrillation with a rate in the 160s.  Patient has received initial dose of Cardizem .  Also beta-blockers.  He remains in the 120s to 130s.  He is therefore being admitted with A-fib with RVR.  Denied any change in medications.  No change in diet. Potassium was low at 3.3 and has some mild AKI creatinine 1.51.Troponin is 21.  Patient is being admitted for further evaluation and treatment.  Review of Systems: As mentioned in the history of present illness. All other systems reviewed and are negative. Past Medical History:  Diagnosis Date   Alcoholism [facility])    Allergy    Aortic atherosclerosis (HCC)    Arthritis    Ascending aorta dilation (HCC) 03/28/2018   a.) Vascular US : prox asc Ao measured 29 mm. b.)  CT CAP 06/22/2021: Ao root 41 mm. c.) TTE 06/26/2021: Ao root 41 mm; asc Ao 39 mm   Atrial fibrillation (HCC) 1995   a.) single episode in 1995 per patient; no long term treatment. b.) recurrent episode in the setting of GI bleeding related to colitis 06/2021.   Basal  cell carcinoma 04/26/2017   Right medial cheek. Superficial and nodular   Basal cell carcinoma 06/11/2019   Left anterior shoulder. Nodular pattern   Basal cell carcinoma 09/09/2019   Right nasal ala, EDC   Basal cell carcinoma 02/05/2020   L upper eyebrow, EDC    Basal cell carcinoma 10/12/2022   left post auricular, EDC   Basal cell carcinoma 05/02/2023   Right medial cheek, EDC   Bilateral carotid artery stenosis 06/09/2019   a.) Carotid doppler: mod; <50% BILATERAL ICAs.   Cataract    Chicken pox    Colon cancer (HCC)    Colon polyp    Depression    Son died 01-02-15   Diverticulitis    Diverticulosis 30 years   Gastritis    GERD (gastroesophageal reflux disease)    H. pylori infection    History of stress test    a. 09/2015 MV: No ischemia/infarct. EF 45-54% (nl by echo).   Hyperlipidemia    Hypertension    Irritable bowel syndrome    Lacunar infarction (HCC) 06/08/2019   a.) small; RIGHT motor strip   Neuromuscular disorder (HCC)    NSVT (nonsustained ventricular tachycardia) (HCC)    a.)  Single episode lasting 5 beats at a maximum rate of 160 bpm noted on Holter study performed 08/13/2021.   Prostate cancer [facility]) January 02, 2011   a.) s/p XRT   PSVT (paroxysmal supraventricular tachycardia) (HCC)    a. 06/2019 Zio: Avg rate 74 (54-120),  occas PACs, rare PVCs, 125 episodes of PVCs (longest 17.5 secs; max rate 187). No afib.   RBBB    SAH (subarachnoid hemorrhage) (HCC) 10/10/2018   Sleep apnea    Squamous cell carcinoma of skin 07/18/2017   Left medial calf. KA type   Squamous cell carcinoma of skin 04/24/2018   Right above med. brow   Squamous cell carcinoma of skin 06/11/2019   Right posterior shoulder. SCCis, hypertrophic   Squamous cell carcinoma of skin 01/09/2020   Mid nasal dorsum, MOHS, Efudex x 4wks   Substance abuse (HCC)    Systolic dysfunction    a.) TTE 10/05/2015: EF 50-55%; mild LVH; LAE; triv AR, mild MR. b.) TTE 03/29/2018: EF 55-60%; LAE, mild AR; ? small  PFO. c.) TTE 06/09/2019: EF 55-60%, no rwma, triv MR/AI. d.) TTE 06/26/2021: EF 45-50%; glob HK; LAE; mild MR; Ao root 41 mm; asc Ao 39 mm.   Vasovagal syncope    Past Surgical History:  Procedure Laterality Date   CARDIAC CATHETERIZATION  1998   [place] no stents   CATARACT EXTRACTION, BILATERAL     COLON RESECTION SIGMOID N/A 12/07/2016   Procedure: COLON RESECTION SIGMOID;  Surgeon: Shelva Dunnings, MD;  Location: [facility] ORS;  Service: General;  Laterality: N/A;   COLON SURGERY  11/2016   Colostomy   COLONOSCOPY  2016   COLONOSCOPY WITH PROPOFOL  N/A 05/30/2016   Procedure: COLONOSCOPY WITH PROPOFOL ;  Surgeon: Gladis RAYMOND Mariner, MD;  Location: [facility] ENDOSCOPY;  Service: Endoscopy;  Laterality: N/A;   COLOSTOMY Left 12/07/2016   Procedure: COLOSTOMY;  Surgeon: Shelva Dunnings, MD;  Location: [facility] ORS;  Service: General;  Laterality: Left;   COLOSTOMY REVERSAL N/A 03/21/2017   Procedure: COLOSTOMY REVERSAL;  Surgeon: Shelva Dunnings, MD;  Location: [facility] ORS;  Service: General;  Laterality: N/A;   COLOSTOMY TAKEDOWN N/A 03/21/2017   Procedure: LAPAROSCOPIC COLOSTOMY TAKEDOWN;  Surgeon: Shelva Dunnings, MD;  Location: [facility] ORS;  Service: General;  Laterality: N/A;   CYSTOSCOPY WITH STENT PLACEMENT Bilateral 03/21/2017   Procedure: CYSTOSCOPY WITH LIGHTED STENT PLACEMENT;  Surgeon: Twylla Glendia BROCKS, MD;  Location: [facility] ORS;  Service: Urology;  Laterality: Bilateral;   ESOPHAGOGASTRODUODENOSCOPY     ESOPHAGOGASTRODUODENOSCOPY N/A 05/30/2016   Procedure: ESOPHAGOGASTRODUODENOSCOPY (EGD);  Surgeon: Gladis RAYMOND Mariner, MD;  Location: [facility] ENDOSCOPY;  Service: Endoscopy;  Laterality: N/A;   ESOPHAGOGASTRODUODENOSCOPY N/A 01/14/2021   Procedure: ESOPHAGOGASTRODUODENOSCOPY (EGD);  Surgeon: Toledo, Ladell POUR, MD;  Location: [facility] ENDOSCOPY;  Service: Gastroenterology;  Laterality: N/A;   ESOPHAGOGASTRODUODENOSCOPY (EGD) WITH PROPOFOL  N/A 04/28/2021   Procedure: ESOPHAGOGASTRODUODENOSCOPY (EGD)  WITH PROPOFOL ;  Surgeon: Toledo, Ladell POUR, MD;  Location: [facility] ENDOSCOPY;  Service: Gastroenterology;  Laterality: N/A;   EYE SURGERY     cataracts   FLEXIBLE SIGMOIDOSCOPY N/A 03/21/2017   Procedure: FLEXIBLE SIGMOIDOSCOPY;  Surgeon: Shelva Dunnings, MD;  Location: [facility] ORS;  Service: General;  Laterality: N/A;   FLEXIBLE SIGMOIDOSCOPY N/A 06/24/2021   Procedure: ENID MORIN;  Surgeon: Therisa Bi, MD;  Location: [facility] ENDOSCOPY;  Service: Gastroenterology;  Laterality: N/A;   FRACTURE SURGERY Bilateral    right arm and left wrist   INCISION AND DRAINAGE ABSCESS N/A 12/07/2016   Procedure: DRAINAGE  OF INTRA ABDOMINAL ABSCESS;  Surgeon: Shelva Dunnings, MD;  Location: [facility] ORS;  Service: General;  Laterality: N/A;   JOINT REPLACEMENT     LAPAROTOMY N/A 12/07/2016   Procedure: EXPLORATORY LAPAROTOMY;  Surgeon: Shelva Dunnings, MD;  Location: [facility] ORS;  Service: General;  Laterality: N/A;   PROSTATE SURGERY  Microwave therapy   TONSILLECTOMY     TOTAL HIP ARTHROPLASTY Right 09/27/2021   Procedure: TOTAL HIP ARTHROPLASTY ANTERIOR APPROACH;  Surgeon: Leora Lynwood SAUNDERS, MD;  Location: [facility] ORS;  Service: Orthopedics;  Laterality: Right;   TOTAL KNEE ARTHROPLASTY Right 07/27/2016   Procedure: TOTAL KNEE ARTHROPLASTY;  Surgeon: Kayla Pinal, MD;  Location: [facility] ORS;  Service: Orthopedics;  Laterality: Right;  Dr. Penne had to place Urinary catheter due to prostate cancer history.  Using flexible scope.   Social History:  reports that he quit smoking about 35 years ago. His smoking use included cigarettes. He started smoking about 62 years ago. He has a 27 pack-year smoking history. His smokeless tobacco use includes chew. He reports current alcohol use of about 7.0 standard drinks of alcohol per week. He reports that he does not use drugs.  Allergies  Allergen Reactions   Amoxicillin -Pot Clavulanate Other (See Comments)    Abdominal upset  Has patient had a PCN reaction  causing immediate rash, facial/tongue/throat swelling, SOB or lightheadedness with hypotension: Unknown  Has patient had a PCN reaction causing severe rash involving mucus membranes or skin necrosis: Unknown  Has patient had a PCN reaction that required hospitalization: Unknown  Has patient had a PCN reaction occurring within the last 10 years: Unknown  If all of the above answers are NO, then may proceed with Cephalosporin use.  amoxicillin  / clavulanate   Formaldehyde Rash    From shoes made with this material    Family History  Problem Relation Age of Onset   Lung cancer Father        smoker   Cancer Father    Other Mother    Vision loss Mother    Sudden death Son        due to Blood clots   Bipolar disorder Son    Heart disease Son    Early death Son    Learning disabilities Son    Kidney disease Daughter        congenital one small kidney   Varicose Veins Sister    Prostate cancer Neg Hx    Bladder Cancer Neg Hx     Prior to Admission medications   Medication Sig Start Date End Date Taking? Authorizing Provider  albuterol  (VENTOLIN  HFA) 108 (90 Base) MCG/ACT inhaler Inhale 2 puffs into the lungs every 6 (six) hours as needed for wheezing or shortness of breath. 09/12/23   Levander Slate, MD  Alpha-Lipoic Acid 600 MG TABS Take 1 tablet by mouth daily.    [provider]  amLODipine  (NORVASC ) 5 MG tablet TAKE 1 TABLET BY MOUTH EVERY DAY 12/11/23   Bacigalupo, Jon [name], MD  aspirin  81 MG chewable tablet Chew 1 tablet (81 mg total) by mouth 2 (two) times daily. 09/30/21   Joshua Lin, PA-C  atorvastatin  (LIPITOR) 40 MG tablet TAKE 1 TABLET BY MOUTH EVERY DAY 01/02/23   Bacigalupo, Angela M, MD  cetirizine  (ZYRTEC ) 10 MG tablet Take 10 mg by mouth daily.    [provider]  Cholecalciferol (VITAMIN D-3) 25 MCG (1000 UT) CAPS Take 1,000 Units by mouth daily.    [provider]  docusate sodium  (COLACE) 100 MG capsule Take 1 capsule (100 mg total) by  mouth 2 (two) times daily. Patient taking differently: Take 200 mg by mouth daily. 09/30/21   Joshua Lin, PA-C  fluticasone  (FLONASE ) 50 MCG/ACT nasal spray PLACE 1 SPRAY INTO BOTH NOSTRILS DAILY AS NEEDED FOR ALLERGIES OR RHINITIS. 10/17/23  Myrla Jon [name], MD  hydrocortisone  2.5 % cream APPLY TO AFFECTED AREA TWICE A DAY AS NEEDED FOR RASH 02/20/23   Myrla Jon [name], MD  ketoconazole  (NIZORAL ) 2 % cream Apply once or twice daily to affected skin folds as needed for rash 05/02/23   Jackquline Sawyer, MD  lisinopril -hydrochlorothiazide  (ZESTORETIC ) 10-12.5 MG tablet Take 1 tablet by mouth daily. 02/27/23   Bacigalupo, Angela M, MD  metoprolol  tartrate (LOPRESSOR ) 100 MG tablet TAKE 1 TABLET BY MOUTH TWICE A DAY 12/12/23   Bacigalupo, Angela M, MD  Multiple Vitamin (MULTIVITAMIN WITH MINERALS) TABS tablet Take 1 tablet by mouth daily. 10/24/18   Vachhani, Vaibhavkumar, MD  omeprazole  (PRILOSEC) 20 MG capsule TAKE 1 CAPSULE (20 MG TOTAL) BY MOUTH DAILY. REPORTS CURRENTLY TAKING AS NEEDED 09/21/23   Myrla Jon [name], MD  pregabalin  (LYRICA ) 25 MG capsule Take 25 mg by mouth 2 (two) times daily.    [provider]  sertraline  (ZOLOFT ) 100 MG tablet Take 1 tablet (100 mg total) by mouth daily. 03/27/23   Bacigalupo, Angela M, MD  tadalafil  (CIALIS ) 5 MG tablet Take 1 tablet (5 mg total) by mouth daily as needed for erectile dysfunction. 07/07/23   Bacigalupo, Angela M, MD  thiamine  (VITAMIN B1) 100 MG tablet Take 100 mg by mouth daily.    [provider]  vitamin B-12 (CYANOCOBALAMIN ) 1000 MCG tablet Take 1,000 mcg by mouth daily.    [provider]  zinc gluconate 50 MG tablet Take 50 mg by mouth daily.    [provider]    Physical Exam: Vitals:   12/20/23 2245 12/20/23 2327 12/20/23 2330 12/21/23 0000  BP: 94/69 104/77 108/79 110/73  Pulse: (!) 150 (!) 119 80 (!) 118  Resp:  (!) 23 (!) 21 19  Temp: 98.3 F (36.8 C)     TempSrc: Oral     SpO2: 100% 95%  96% 96%  Weight:      Height:       Constitutional: Acutely ill looking, NAD, calm, comfortable Eyes: PERRL, lids and conjunctivae normal ENMT: Mucous membranes are moist. Posterior pharynx clear of any exudate or lesions.Normal dentition.  Neck: normal, supple, no masses, no thyromegaly Respiratory: clear to auscultation bilaterally, no wheezing, no crackles. Normal respiratory effort. No accessory muscle use.  Cardiovascular: Irregularly irregular with tachycardia, no murmurs / rubs / gallops. No extremity edema. 2+ pedal pulses. No carotid bruits.  Abdomen: no tenderness, no masses palpated. No hepatosplenomegaly. Bowel sounds positive.  Musculoskeletal: Good range of motion, no joint swelling or tenderness, Skin: Multiple scattered skin lesions and scabs, ulcers. No induration Neurologic: CN 2-12 grossly intact. Sensation intact, DTR normal. Strength 5/5 in all 4.  Psychiatric: Normal judgment and insight. Alert and oriented x 3. Normal mood  Data Reviewed:  Temperature 98.3, blood pressure 110/73, pulse 150 resp 23 oxygen sat 95% on room air.  Cameron count is 9.2.  Hemoglobin 12.0.  Troponin 21, magnesium  2.2 potassium 3.3 glucose 111 and creatinine 1.51.  THS is 3.79  Assessment and Plan:  #1 atrial fibrillation with rapid ventricular response: Patient has known history of atrial fibrillation but not on anticoagulation.  Has been on beta-blockers.  Will admit the patient a walker for A-fib with RVR.  Not sure of the trigger.  Magnesium  appears stable and THS within normal.  Will get echocardiogram.  Had previous normal EF so we will initiate Cardizem  drip.  Restart beta-blockers gradually and titrate off the drip.  Patient has had remote history of  subarachnoid hemorrhage and GI bleed.  With RVR will be trial of heparin  drip to see if he can tolerate it.  Cardiology consult.  #2 essential hypertension: Patient will be on Cardizem  and metoprolol .  Continue to monitor  #3 hypokalemia:  Replete potassium.  Magnesium  appears to be normal.  #4 hyperlipidemia: Will confirm will continue statin  #5 chronic kidney disease stage IIIa: Continue to monitor  #6 depression with anxiety: Confirmed on resume home regimen  #7 history of prostate cancer: In remission.  Continue to monitor   Advance Care Planning:   Code Status: Full Code   Consults: Cardiology consult in the morning  Family Communication: No family at bedside  Severity of Illness: The appropriate patient status for this patient is INPATIENT. Inpatient status is judged to be reasonable and necessary in order to provide the required intensity of service to ensure the patient's safety. The patient's presenting symptoms, physical exam findings, and initial radiographic and laboratory data in the context of their chronic comorbidities is felt to place them at high risk for further clinical deterioration. Furthermore, it is not anticipated that the patient will be medically stable for discharge from the hospital within 2 midnights of admission.   * I certify that at the point of admission it is my clinical judgment that the patient will require inpatient hospital care spanning beyond 2 midnights from the point of admission due to high intensity of service, high risk for further deterioration and high frequency of surveillance required.*  AuthorBETHA SIM KNOLL, MD 12/21/2023 12:25 AM  For on call review [email or web address].

## 2023-12-21 NOTE — Progress Notes (Signed)
 ANTICOAGULATION CONSULT NOTE  Pharmacy Consult for heparin  infusion Indication: atrial fibrillation  Allergies  Allergen Reactions   Amoxicillin -Pot Clavulanate Other (See Comments)    Abdominal upset  Has patient had a PCN reaction causing immediate rash, facial/tongue/throat swelling, SOB or lightheadedness with hypotension: Unknown  Has patient had a PCN reaction causing severe rash involving mucus membranes or skin necrosis: Unknown  Has patient had a PCN reaction that required hospitalization: Unknown  Has patient had a PCN reaction occurring within the last 10 years: Unknown  If all of the above answers are NO, then may proceed with Cephalosporin use.  amoxicillin  / clavulanate   Formaldehyde Rash    From shoes made with this material    Patient Measurements: Height: 5' 9 (175.3 cm) Weight: 95.3 kg (210 lb) IBW/kg (Calculated) : 70.7 HEPARIN  DW (KG): 90.4  Vital Signs: Temp: 98 F (36.7 C) (09/04 0635) Temp Source: Oral (09/04 0635) BP: 106/80 (09/04 0930) Pulse Rate: 68 (09/04 0930)  Labs: Recent Labs    12/20/23 2251 12/21/23 0050 12/21/23 0058 12/21/23 0935  HGB 12.0* 12.4*  --   --   HCT 37.0* 38.8*  --   --   PLT 234 232  --   --   APTT  --   --  32  --   LABPROT  --   --  13.7  --   INR  --   --  1.0  --   HEPARINUNFRC  --   --   --  0.57  CREATININE 1.51* 1.11  --   --   TROPONINIHS 21* 50*  --   --     Estimated Creatinine Clearance: 57.4 mL/min (by C-G formula based on SCr of 1.11 mg/dL).   Medical History: Past Medical History:  Diagnosis Date   Alcoholism (HCC)    Allergy    Aortic atherosclerosis (HCC)    Arthritis    Ascending aorta dilation (HCC) 03/28/2018   a.) Vascular US : prox asc Ao measured 29 mm. b.)  CT CAP 06/22/2021: Ao root 41 mm. c.) TTE 06/26/2021: Ao root 41 mm; asc Ao 39 mm   Atrial fibrillation (HCC) 1995   a.) single episode in 1995 per patient; no long term treatment. b.) recurrent episode in the setting of  GI bleeding related to colitis 06/2021.   Basal cell carcinoma 04/26/2017   Right medial cheek. Superficial and nodular   Basal cell carcinoma 06/11/2019   Left anterior shoulder. Nodular pattern   Basal cell carcinoma 09/09/2019   Right nasal ala, EDC   Basal cell carcinoma 02/05/2020   L upper eyebrow, EDC    Basal cell carcinoma 10/12/2022   left post auricular, EDC   Basal cell carcinoma 05/02/2023   Right medial cheek, EDC   Bilateral carotid artery stenosis 06/09/2019   a.) Carotid doppler: mod; <50% BILATERAL ICAs.   Cataract    Chicken pox    Colon cancer (HCC)    Colon polyp    Depression    Son died Dec 30, 2014   Diverticulitis    Diverticulosis 30 years   Gastritis    GERD (gastroesophageal reflux disease)    H. pylori infection    History of stress test    a. 09/2015 MV: No ischemia/infarct. EF 45-54% (nl by echo).   Hyperlipidemia    Hypertension    Irritable bowel syndrome    Lacunar infarction (HCC) 06/08/2019   a.) small; RIGHT motor strip   Neuromuscular disorder (HCC)    NSVT (  nonsustained ventricular tachycardia) (HCC)    a.)  Single episode lasting 5 beats at a maximum rate of 160 bpm noted on Holter study performed 08/13/2021.   Prostate cancer [facility]) 2012   a.) s/p XRT   PSVT (paroxysmal supraventricular tachycardia) (HCC)    a. 06/2019 Zio: Avg rate 74 (54-120), occas PACs, rare PVCs, 125 episodes of PVCs (longest 17.5 secs; max rate 187). No afib.   RBBB    SAH (subarachnoid hemorrhage) (HCC) 10/10/2018   Sleep apnea    Squamous cell carcinoma of skin 07/18/2017   Left medial calf. KA type   Squamous cell carcinoma of skin 04/24/2018   Right above med. brow   Squamous cell carcinoma of skin 06/11/2019   Right posterior shoulder. SCCis, hypertrophic   Squamous cell carcinoma of skin 01/09/2020   Mid nasal dorsum, MOHS, Efudex x 4wks   Substance abuse (HCC)    Systolic dysfunction    a.) TTE 10/05/2015: EF 50-55%; mild LVH; LAE; triv AR, mild MR. b.)  TTE 03/29/2018: EF 55-60%; LAE, mild AR; ? small PFO. c.) TTE 06/09/2019: EF 55-60%, no rwma, triv MR/AI. d.) TTE 06/26/2021: EF 45-50%; glob HK; LAE; mild MR; Ao root 41 mm; asc Ao 39 mm.   Vasovagal syncope     Assessment: Pt is a 83 yo male with h/o a fib on metoprolol  presenting to ED c/o elevated HR and SOB with exertion.  Goal of Therapy:  Heparin  level 0.3-0.7 units/ml Monitor platelets by anticoagulation protocol: Yes   09/04 0935 HL 0.57, therapeutic x 1  Plan:  HL therapeutic x 1 Continue heparin  infusion at 1350 units/hr Recheck HL in 8 hours to confirm CBC daily while on heparin   Kayla JULIANNA Blew, PharmD, BCPS 12/21/2023 10:40 AM

## 2023-12-21 NOTE — Assessment & Plan Note (Addendum)
 History of significant alcohol use on a daily basis. Patient uses this for anxiety and sleeping. - CIWA protocol -Thiamine  supplement -Counseling was provided

## 2023-12-21 NOTE — Assessment & Plan Note (Signed)
 No history of recurrent colitis since 2023. No history of any recent bleed. - Continue to monitor as patient need anticoagulation now

## 2023-12-21 NOTE — Assessment & Plan Note (Signed)
-   Continue with statin

## 2023-12-21 NOTE — ED Notes (Addendum)
 Per pt, Cameron Foster is his sister. Pt requests she be contacted in case of any changes or concerns. [phone]. Info already entered into pt contacts.

## 2023-12-22 ENCOUNTER — Inpatient Hospital Stay (HOSPITAL_COMMUNITY)
Admit: 2023-12-22 | Discharge: 2023-12-22 | Disposition: A | Discharge status: Home / Self Care | Attending: Cardiovascular Disease | Admitting: Cardiovascular Disease

## 2023-12-22 ENCOUNTER — Telehealth (HOSPITAL_COMMUNITY): Payer: Self-pay | Admitting: Pharmacy Technician

## 2023-12-22 ENCOUNTER — Other Ambulatory Visit: Payer: Self-pay

## 2023-12-22 ENCOUNTER — Other Ambulatory Visit (HOSPITAL_COMMUNITY): Payer: Self-pay

## 2023-12-22 DIAGNOSIS — I4891 Unspecified atrial fibrillation: Secondary | ICD-10-CM

## 2023-12-22 DIAGNOSIS — Z8719 Personal history of other diseases of the digestive system: Secondary | ICD-10-CM | POA: Diagnosis not present

## 2023-12-22 DIAGNOSIS — Z8679 Personal history of other diseases of the circulatory system: Secondary | ICD-10-CM

## 2023-12-22 DIAGNOSIS — I10 Essential (primary) hypertension: Secondary | ICD-10-CM | POA: Diagnosis not present

## 2023-12-22 DIAGNOSIS — F1020 Alcohol dependence, uncomplicated: Secondary | ICD-10-CM

## 2023-12-22 DIAGNOSIS — E876 Hypokalemia: Secondary | ICD-10-CM

## 2023-12-22 DIAGNOSIS — N1831 Chronic kidney disease, stage 3a: Secondary | ICD-10-CM

## 2023-12-22 LAB — ECHOCARDIOGRAM COMPLETE
AR max vel: 3.06 cm2
AV Area VTI: 2.38 cm2
AV Area mean vel: 2.78 cm2
AV Mean grad: 1 mmHg
AV Peak grad: 2.6 mmHg
Ao pk vel: 0.8 m/s
Area-P 1/2: 6.54 cm2
Calc EF: 23.1 %
Height: 69 in
MV VTI: 3.72 cm2
S' Lateral: 3 cm
Single Plane A2C EF: 15.2 %
Single Plane A4C EF: 22.7 %
Weight: 3360 [oz_av]

## 2023-12-22 LAB — CBC
HCT: 38.3 % — ABNORMAL LOW (ref 39.0–52.0)
Hemoglobin: 12.5 g/dL — ABNORMAL LOW (ref 13.0–17.0)
MCH: 30.2 pg (ref 26.0–34.0)
MCHC: 32.6 g/dL (ref 30.0–36.0)
MCV: 92.5 fL (ref 80.0–100.0)
Platelets: 228 K/uL (ref 150–400)
RBC: 4.14 MIL/uL — ABNORMAL LOW (ref 4.22–5.81)
RDW: 13.1 % (ref 11.5–15.5)
WBC: 9.3 K/uL (ref 4.0–10.5)
nRBC: 0 % (ref 0.0–0.2)

## 2023-12-22 LAB — BASIC METABOLIC PANEL WITH GFR
Anion gap: 8 (ref 5–15)
BUN: 17 mg/dL (ref 8–23)
CO2: 25 mmol/L (ref 22–32)
Calcium: 9.4 mg/dL (ref 8.9–10.3)
Chloride: 101 mmol/L (ref 98–111)
Creatinine, Ser: 1.1 mg/dL (ref 0.61–1.24)
GFR, Estimated: >60 mL/min (ref >60)
Glucose, Bld: 115 mg/dL — ABNORMAL HIGH (ref 70–99)
Potassium: 4.1 mmol/L (ref 3.5–5.1)
Sodium: 134 mmol/L — ABNORMAL LOW (ref 135–145)

## 2023-12-22 MED ORDER — DILTIAZEM HCL 30 MG PO TABS
60.0000 mg | ORAL_TABLET | Freq: Four times a day (QID) | ORAL | Status: DC
  Administered 2023-12-22 (×2): 60 mg via ORAL
  Filled 2023-12-22 (×3): qty 2

## 2023-12-22 MED ORDER — APIXABAN 5 MG PO TABS
5.0000 mg | ORAL_TABLET | Freq: Two times a day (BID) | ORAL | Status: DC
  Administered 2023-12-22 – 2023-12-24 (×5): 5 mg via ORAL
  Filled 2023-12-22 (×5): qty 1

## 2023-12-22 NOTE — Assessment & Plan Note (Addendum)
 History of paroxysmal atrial fibrillation, Electrolytes stable and TSH normal.  Heart rate now improved but remained in atrial fibrillation, earlier some concern of couple of short interval sinus pauses and bradycardia - P.o. Cardizem  dose was decreased to 120 mg daily -Continue with Eliquis  -Continue with metoprolol  -Echocardiogram with normal EF and no regional wall motion abnormalities. - Cardiology is not recommending outpatient cardioversion in 2 to 3 weeks if remained in A-fib

## 2023-12-22 NOTE — ED Notes (Addendum)
 This RN and Hadassah, NT assisted patient with personal hygiene; assisted patient to bedside commode using walker, provided clean scrubs, changed bed linens, chux, and provided warm blanket.

## 2023-12-22 NOTE — Discharge Instructions (Addendum)
 Information on my medicine - ELIQUIS (apixaban)  This medication education was reviewed with me or my healthcare representative as part of my discharge preparation.  The pharmacist that spoke with me during my hospital stay was:    Why was Eliquis prescribed for you? Eliquis was prescribed for you to reduce the risk of a blood clot forming that can cause a stroke if you have a medical condition called atrial fibrillation (a type of irregular heartbeat).  What do You need to know about Eliquis ? Take your Eliquis TWICE DAILY - one tablet in the morning and one tablet in the evening with or without food. If you have difficulty swallowing the tablet whole please discuss with your pharmacist how to take the medication safely.  Take Eliquis exactly as prescribed by your doctor and DO NOT stop taking Eliquis without talking to the doctor who prescribed the medication.  Stopping may increase your risk of developing a stroke.  Refill your prescription before you run out.  After discharge, you should have regular check-up appointments with your healthcare provider that is prescribing your Eliquis.  In the future your dose may need to be changed if your kidney function or weight changes by a significant amount or as you get older.  What do you do if you miss a dose? If you miss a dose, take it as soon as you remember on the same day and resume taking twice daily.  Do not take more than one dose of ELIQUIS at the same time to make up a missed dose.  Important Safety Information A possible side effect of Eliquis is bleeding. You should call your healthcare provider right away if you experience any of the following: Bleeding from an injury or your nose that does not stop. Unusual colored urine (red or dark brown) or unusual colored stools (red or black). Unusual bruising for unknown reasons. A serious fall or if you hit your head (even if there is no bleeding).  Some medicines may interact with  Eliquis and might increase your risk of bleeding or clotting while on Eliquis. To help avoid this, consult your healthcare provider or pharmacist prior to using any new prescription or non-prescription medications, including herbals, vitamins, non-steroidal anti-inflammatory drugs (NSAIDs) and supplements.  This website has more information on Eliquis (apixaban): http://www.eliquis.com/eliquis/home  

## 2023-12-22 NOTE — Assessment & Plan Note (Addendum)
 Blood pressure now within goal, mildly softer blood pressure earlier Home amlodipine  has been switched with Cardizem  Continue with metoprolol 

## 2023-12-22 NOTE — TOC Initial Note (Signed)
 Transition of Care (TOC) - Initial/Assessment Note    Patient Details  Name: Cameron Foster. MRN: [id number] Date of Birth: Sep 10, 1940  Transition of Care [facility]) CM/SW Contact:    [place] L Caidance Sybert, LCSW Phone Number: 12/22/2023, 10:17 AM  Clinical Narrative:                  CSW received a TOC consult for substance abuse education/counseling. Resources were added to the AVS.   TOC will continue to follow patient through discharge. Please enter a consult when needed.        Patient Goals and CMS Choice            Expected Discharge Plan and Services                                              Prior Living Arrangements/Services                       Activities of Daily Living      Permission Sought/Granted                  Emotional Assessment              Admission diagnosis:  Atrial fibrillation with rapid ventricular response (HCC) [I48.91] Patient Active Problem List   Diagnosis Date Noted   Atrial fibrillation with RVR (HCC) 12/21/2023   Hypokalemia 12/21/2023   History of colitis 12/21/2023   History of GI bleed 12/21/2023   OSA (obstructive sleep apnea) 03/27/2023   Prediabetes 12/26/2022   Facial trauma, initial encounter 12/22/2022   Lumbar radiculopathy 10/24/2022   Heart failure with mildly reduced ejection fraction (HFmrEF) (HCC) 05/04/2022   Urinary problem 05/04/2022   Post-void dribbling 04/25/2022   Penile atrophy 04/25/2022   Bilateral leg weakness 04/25/2022   Gait abnormality 04/25/2022   Ataxia, 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   Tobacco abuse 12/21/2021   Chronic bronchitis (HCC) 10/13/2021   Presence of right artificial hip joint 09/30/2021   Long term (current) use of anticoagulants 09/30/2021   Atherosclerosis of coronary artery without angina pectoris 09/30/2021   Allergic rhinitis 09/30/2021   Diverticular disease 09/30/2021   Gastroesophageal reflux disease without esophagitis  09/30/2021   Irritable bowel syndrome 09/30/2021   Malignant tumor of prostate (HCC) 09/30/2021   Malignant neoplasm of skin 09/30/2021   Vitamin B deficiency 09/30/2021   History of total hip replacement, right 09/27/2021   Lone atrial fibrillation (HCC)    Acute clinical systolic heart failure (HCC) 06/26/2021   Left sided colitis (HCC) 06/23/2021   Actinic keratosis 05/24/2021   Squamous cell cancer of skin of left forearm 05/24/2021   Chronic gastric ulcer with hemorrhage 02/25/2021   Irregular Z line of esophagus 02/25/2021   Chronic pansinusitis 01/26/2021   Dizziness 01/26/2021   Melena 01/13/2021   Alcoholism (HCC) 01/13/2021   Stage 3a chronic kidney disease (HCC) 11/23/2020   Chronic pain of right ankle 05/21/2020   Achilles tendinitis 04/30/2020   Synovitis and tenosynovitis 04/30/2020   Lymphedema 03/03/2020   Chronic venous insufficiency 03/03/2020   Fatigue 02/14/2020   Leg edema 02/14/2020   Foraminal stenosis of lumbar region 11/08/2019   Chronic bilateral low back pain without sciatica 10/18/2019   Foraminal stenosis of cervical region 10/18/2019   History of lacunar cerebrovascular accident (CVA) 07/10/2019  History of stroke 07/01/2019   TIA (transient ischemic attack) 10/20/2018   History of subarachnoid hemorrhage 10/16/2018   Low libido 08/30/2018   Obesity 05/14/2018   Hyperlipidemia LDL goal <70 05/14/2018   Degeneration of lumbar intervertebral disc 03/22/2018   Osteoarthritis of hip 03/22/2018   Trochanteric bursitis of right hip 03/22/2018   History of colostomy reversal 03/21/2017   Status post partial colectomy 01/03/2017   Depression, major, single episode, mild (HCC) 01/03/2017   Vasovagal syncope 01/27/2016   History of skin cancer 10/28/2015   Osteoarthritis of right knee 10/01/2015   Essential hypertension 08/25/2015   Dyspnea on exertion 08/12/2015   Erectile dysfunction following radiation therapy 08/04/2015   Constipation 08/02/2015    History of prostate cancer 01/20/2015   Grief 01/20/2015   Swelling of right lower extremity 01/20/2015   PCP:  Myrla Jon [name], MD Pharmacy:   [facility] [phone] [address], [place] - [address] [place] [address] Phone: [phone] Fax: [phone]  [facility] [address] [place] [address] Phone: [phone] Fax: [phone]     Social Drivers of Health (SDOH) Social History: SDOH Screenings   Food Insecurity: No Food Insecurity (11/24/2023)  Housing: Low Risk  (11/24/2023)  Transportation Needs: No Transportation Needs (11/24/2023)  Utilities: Not At Risk (05/31/2023)  Alcohol Screen: Low Risk  (12/11/2023)  Depression (PHQ2-9): Medium Risk (12/11/2023)  Financial Resource Strain: Low Risk  (11/24/2023)  Physical Activity: Inactive (11/24/2023)  Social Connections: Moderately Integrated (11/24/2023)  Stress: Stress Concern Present (11/24/2023)  Tobacco Use: High Risk (12/20/2023)  Health Literacy: Adequate Health Literacy (05/31/2023)   SDOH Interventions:     Readmission Risk Interventions     No data to display

## 2023-12-22 NOTE — Progress Notes (Signed)
 Progress Note   Patient: Cameron Foster. FMW:[id number] DOB: 1941/02/23 DOA: 12/20/2023     1 DOS: the patient was seen and examined on 12/22/2023   Brief hospital course: Partly taken from H&P.   Iris Tatsch. is a 83 y.o. male with medical history significant of remote history of alcoholism and tobacco abuse, paroxysmal atrial fibrillation, ascending aortic aneurysm, prior history of GI bleed, history of subarachnoid hemorrhage, basal cell carcinoma,Hyperlipidemia, essential hypertension, depression with anxiety, history of diastolic CHF with last EF of 55 to 60% in January 2024 who presents to the ER with palpitation.  Denied any chest pain.   On presentation patient was found to be in A-fib with RVR, heart rate in 160s, poor response to initial Cardizem  and beta-blocker bolus.  Labs with mild AKI with creatinine at 1.51, troponin 21, TSH 4.023, potassium 3.3, magnesium  2.2.  Patient was started on heparin  and Cardizem  infusion.  Cardiology was consulted.  Echocardiogram ordered.  9/4: Remained in RVR, heart rate in 120s, AKI resolved, potassium improved to 3.9, lipid panel mostly normal, triglyceride of 167. Cardiology transition Cardizem  infusion with p.o., echocardiogram pending. Patient will be started on Eliquis  before discharge.  Assessment and Plan: * Atrial fibrillation with RVR (HCC) History of paroxysmal atrial fibrillation, Electrolytes stable and TSH normal.  Heart rate now improved but remained in atrial fibrillation - Cardiology switched Cardizem  infusion with p.o. -Heparin  has been switched with Cardizem  -Continue with metoprolol  -Echocardiogram with normal EF and no regional wall motion abnormalities. - If remained in atrial fibrillation then TEE guided cardioversion will be needed before discharge.  Essential hypertension Blood pressure within goal Home amlodipine  has been switched with Cardizem  Continue with metoprolol   History of GI bleed No history of  recurrent colitis since 2023. No history of any recent bleed. - Continue to monitor as patient need anticoagulation now  History of subarachnoid hemorrhage No acute concern.  Has history of remote subarachnoid hemorrhage.  No recurrent falls.  Hypokalemia Improved with repletion. - Continue to monitor and replete as needed  Hyperlipidemia LDL goal <70 - Continue with statin  Stage 3a chronic kidney disease (HCC) Improved and GFR now above 60. - Monitor renal function -Avoid nephrotoxins  Alcoholism (HCC) History of significant alcohol use on a daily basis. Patient uses this for anxiety and sleeping. - CIWA protocol -Thiamine  supplement -Counseling was provided   Subjective: Patient was seen and examined today.  No chest pain or shortness of breath.  He wants to go home.  Physical Exam: Vitals:   12/22/23 1200 12/22/23 1330 12/22/23 1615 12/22/23 1645  BP: 123/76 120/82  (!) 133/93  Pulse: (!) 107 80 95 85  Resp: 20 15 (!) 21 19  Temp:    98.1 F (36.7 C)  TempSrc:      SpO2: 95% 97% 94% 96%  Weight:      Height:       General.  Obese gentleman, in no acute distress. Pulmonary.  Lungs clear bilaterally, normal respiratory effort. CV.  Irregularly irregular Abdomen.  Soft, nontender, nondistended, BS positive. CNS.  Alert and oriented .  No focal neurologic deficit. Extremities.  No edema, no cyanosis, pulses intact and symmetrical. Psychiatry.  Judgment and insight appears normal.   Data Reviewed: Prior data reviewed  Family Communication: Discussed with patient  Disposition: Status is: Inpatient Remains inpatient appropriate because: Severity of illness  Planned Discharge Destination: Home  DVT prophylaxis.  Heparin  infusion Time spent:50  minutes  This record has  been created using Conservation officer, historic buildings. Errors have been sought and corrected,but may not always be located. Such creation errors do not reflect on the standard of care.    Author: Amaryllis Dare, MD 12/22/2023 5:54 PM  For on call review [email or web address].

## 2023-12-22 NOTE — Assessment & Plan Note (Signed)
Improved with repletion. -Continue to monitor and replete as needed

## 2023-12-22 NOTE — Telephone Encounter (Signed)
 Patient Product/process development scientist completed.    The patient is insured through [employer]. Patient has Medicare and is not eligible for a copay card, but may be able to apply for patient assistance or Medicare RX Payment Plan (Patient Must reach out to their plan, if eligible for payment plan), if available.    Ran test claim for Eliquis  5 mg and the current 30 day co-pay is $270.56 due to a $590.00 deductible.   This test claim was processed through [facility]- copay amounts may vary at other pharmacies due to pharmacy/plan contracts, or as the patient moves through the different stages of their insurance plan.     Reyes Sharps, CPHT Pharmacy Technician III Certified Patient Advocate [facility] Patient Advocate Team Direct Number: [phone]  Fax: [phone]

## 2023-12-22 NOTE — Progress Notes (Signed)
  Progress Note  Patient Name: Cameron Foster. Date of Encounter: 12/22/2023 [facility] Cardiologist: End  Interval Summary    Patient remains in Afib with controlled rates. He is overall asymptomatic. Labs and echo pending.   Vital Signs Vitals:   12/22/23 0500 12/22/23 0545 12/22/23 0630 12/22/23 0700  BP: (!) 127/102 126/83 106/76 109/64  Pulse: (!) 34 77 74 68  Resp: (!) 24 (!) 23 13 18   Temp:  97.9 F (36.6 C)    TempSrc:  Oral    SpO2: 95% 95% 94% 96%  Weight:      Height:        Intake/Output Summary (Last 24 hours) at 12/22/2023 0734 Last data filed at 12/22/2023 9271 Gross per 24 hour  Intake 202.29 ml  Output 1250 ml  Net -1047.71 ml      12/20/2023   10:43 PM 11/27/2023    1:16 PM 09/21/2023    2:20 PM  Last 3 Weights  Weight (lbs) 210 lb 219 lb 14.4 oz 211 lb 12.8 oz  Weight (kg) 95.255 kg 99.746 kg 96.072 kg      Telemetry/ECG  Aifb HR 80-100, PVCs - Personally Reviewed  Physical Exam  GEN: No acute distress.   Neck: No JVD Cardiac: Irreg Irreg, no murmurs, rubs, or gallops.  Respiratory: Clear to auscultation bilaterally. GI: Soft, nontender, non-distended  MS: mild edema on the right leg  Assessment & Plan   Paroxysmal Afib - h/o afib in 2023 during hospitalization for colitis and GIB, he was initially started on Eliquis , but this was stopped due to GIB and no recurrent afib - he presented with palpitations found to be in rapid Afib started on IV dilt - he remaines in Afib with rates 80-100 - he is asymptomatic - update echo - keep Mag>2 and K>4 - CAHDSVASC (agex2, CHF, HTN, PAD) at least 5. No recent bleeding issues. Plan to transition to Eliquis  prior to d/c. IF EF is normal can start Eliquis  - IV cardizem  7.5mg /hr and lopressor  100mg  BID. Can likely transition IV cardizem  to oral - if he does not self convert may need DCCV, can likely be as outpatient as he is asymptomatic   Alcohol use - he drinks at least 2 drink daily -  cessation recommended   HFmrEF - EF was low in 2023 in the setting of Afib - repeat echo in 2024 showed normal LVEF - he has chronic right lower leg edema - repeat echo   CKD stage 3 - Scr at baseline   HTN - BP soft at times with cardizem  and lopressor     For questions or updates, please contact [facility] Please consult [email or web address] for contact info under       Signed, Equan Cogbill VEAR Fishman, PA-C

## 2023-12-22 NOTE — Plan of Care (Signed)

## 2023-12-23 DIAGNOSIS — I5030 Unspecified diastolic (congestive) heart failure: Secondary | ICD-10-CM | POA: Diagnosis not present

## 2023-12-23 DIAGNOSIS — I4891 Unspecified atrial fibrillation: Secondary | ICD-10-CM | POA: Diagnosis not present

## 2023-12-23 DIAGNOSIS — Z8679 Personal history of other diseases of the circulatory system: Secondary | ICD-10-CM | POA: Diagnosis not present

## 2023-12-23 DIAGNOSIS — I10 Essential (primary) hypertension: Secondary | ICD-10-CM | POA: Diagnosis not present

## 2023-12-23 DIAGNOSIS — Z8719 Personal history of other diseases of the digestive system: Secondary | ICD-10-CM | POA: Diagnosis not present

## 2023-12-23 DIAGNOSIS — E785 Hyperlipidemia, unspecified: Secondary | ICD-10-CM | POA: Diagnosis not present

## 2023-12-23 DIAGNOSIS — I480 Paroxysmal atrial fibrillation: Secondary | ICD-10-CM | POA: Diagnosis not present

## 2023-12-23 LAB — CBC
HCT: 37.3 % — ABNORMAL LOW (ref 39.0–52.0)
Hemoglobin: 12.3 g/dL — ABNORMAL LOW (ref 13.0–17.0)
MCH: 30.1 pg (ref 26.0–34.0)
MCHC: 33 g/dL (ref 30.0–36.0)
MCV: 91.4 fL (ref 80.0–100.0)
Platelets: 216 K/uL (ref 150–400)
RBC: 4.08 MIL/uL — ABNORMAL LOW (ref 4.22–5.81)
RDW: 13.1 % (ref 11.5–15.5)
WBC: 7.7 K/uL (ref 4.0–10.5)
nRBC: 0 % (ref 0.0–0.2)

## 2023-12-23 LAB — BASIC METABOLIC PANEL WITH GFR
Anion gap: 7 (ref 5–15)
BUN: 18 mg/dL (ref 8–23)
CO2: 26 mmol/L (ref 22–32)
Calcium: 9.2 mg/dL (ref 8.9–10.3)
Chloride: 104 mmol/L (ref 98–111)
Creatinine, Ser: 1.25 mg/dL — ABNORMAL HIGH (ref 0.61–1.24)
GFR, Estimated: 57 mL/min — ABNORMAL LOW (ref >60)
Glucose, Bld: 131 mg/dL — ABNORMAL HIGH (ref 70–99)
Potassium: 4 mmol/L (ref 3.5–5.1)
Sodium: 137 mmol/L (ref 135–145)

## 2023-12-23 MED ORDER — DILTIAZEM HCL ER COATED BEADS 120 MG PO CP24
240.0000 mg | ORAL_CAPSULE | Freq: Every day | ORAL | Status: DC
Start: 2023-12-23 — End: 2023-12-23
  Filled 2023-12-23: qty 2

## 2023-12-23 MED ORDER — GUAIFENESIN-DM 100-10 MG/5ML PO SYRP
5.0000 mL | ORAL_SOLUTION | ORAL | Status: DC | PRN
  Administered 2023-12-23: 5 mL via ORAL
  Filled 2023-12-23: qty 10

## 2023-12-23 MED ORDER — DILTIAZEM HCL ER COATED BEADS 120 MG PO CP24
120.0000 mg | ORAL_CAPSULE | Freq: Every day | ORAL | Status: DC
  Administered 2023-12-23 – 2023-12-24 (×2): 120 mg via ORAL
  Filled 2023-12-23: qty 1

## 2023-12-23 MED ORDER — DILTIAZEM HCL 30 MG PO TABS
60.0000 mg | ORAL_TABLET | Freq: Four times a day (QID) | ORAL | Status: DC
  Administered 2023-12-23: 60 mg via ORAL

## 2023-12-23 MED ORDER — FLUTICASONE PROPIONATE 50 MCG/ACT NA SUSP
2.0000 | Freq: Every day | NASAL | Status: DC
  Administered 2023-12-23 – 2023-12-24 (×2): 2 via NASAL
  Filled 2023-12-23: qty 16

## 2023-12-23 NOTE — Progress Notes (Signed)
 Progress Note   Patient: Cameron Foster. FMW:[id number] DOB: 1940/07/27 DOA: 12/20/2023     2 DOS: the patient was seen and examined on 12/23/2023   Brief hospital course: Partly taken from H&P.   Cameron Foster. is a 83 y.o. male with medical history significant of remote history of alcoholism and tobacco abuse, paroxysmal atrial fibrillation, ascending aortic aneurysm, prior history of GI bleed, history of subarachnoid hemorrhage, basal cell carcinoma,Hyperlipidemia, essential hypertension, depression with anxiety, history of diastolic CHF with last EF of 55 to 60% in January 2024 who presents to the ER with palpitation.  Denied any chest pain.   On presentation patient was found to be in A-fib with RVR, heart rate in 160s, poor response to initial Cardizem  and beta-blocker bolus.  Labs with mild AKI with creatinine at 1.51, troponin 21, TSH 4.023, potassium 3.3, magnesium  2.2.  Patient was started on heparin  and Cardizem  infusion.  Cardiology was consulted.  Echocardiogram ordered.  9/4: Remained in RVR, heart rate in 120s, AKI resolved, potassium improved to 3.9, lipid panel mostly normal, triglyceride of 167.  9/5: Rate improved, now mostly in 90s, echocardiogram with EF of 55 to 60%, no RWMA and indeterminate diastolic parameters. Cardizem  infusion was switched with p.o. Cardizem  and heparin  infusion is being switched with Eliquis .  9/6: Patient initially with some concern of bradycardia and sinus pauses, blood pressure was soft-Cardizem  dose was held, later heart rate and blood pressure both improved and cardiology decreased the p.o. Cardizem  dose to 120 mg instead of 240 mg and would like to keep him for another day. Remained in A-fib, cardiology will likely do outpatient cardioversion in 2 to 3 weeks if remained in A-fib.  Assessment and Plan: * Atrial fibrillation with RVR (HCC) History of paroxysmal atrial fibrillation, Electrolytes stable and TSH normal.  Heart rate now  improved but remained in atrial fibrillation, earlier some concern of couple of short interval sinus pauses and bradycardia - P.o. Cardizem  dose was decreased to 120 mg daily -Continue with Eliquis  -Continue with metoprolol  -Echocardiogram with normal EF and no regional wall motion abnormalities. - Cardiology is not recommending outpatient cardioversion in 2 to 3 weeks if remained in A-fib  Essential hypertension Blood pressure now within goal, mildly softer blood pressure earlier Home amlodipine  has been switched with Cardizem  Continue with metoprolol   History of GI bleed No history of recurrent colitis since 2023. No history of any recent bleed. - Continue to monitor as patient need anticoagulation now  History of subarachnoid hemorrhage No acute concern.  Has history of remote subarachnoid hemorrhage.  No recurrent falls.  Hypokalemia Improved with repletion. - Continue to monitor and replete as needed  Hyperlipidemia LDL goal <70 - Continue with statin  Stage 3a chronic kidney disease (HCC) Creatinine with another slight increase to 1.25. - Monitor renal function -Avoid nephrotoxins  Alcoholism (HCC) History of significant alcohol use on a daily basis. Patient uses this for anxiety and sleeping. - CIWA protocol -Thiamine  supplement -Counseling was provided   Subjective: Patient was sitting in chair comfortably when seen today.  Very eager to go home.  Physical Exam: Vitals:   12/23/23 0951 12/23/23 1129 12/23/23 1331 12/23/23 1528  BP: 95/66 110/71 (!) 128/104 (!) 106/91  Pulse: 85 93  64  Resp: 17 17 18 19   Temp:  98 F (36.7 C)  97.8 F (36.6 C)  TempSrc:  Oral    SpO2:  97%  96%  Weight:      Height:  General.  Obese elderly man, in no acute distress. Pulmonary.  Lungs clear bilaterally, normal respiratory effort. CV.  Irregularly irregular Abdomen.  Soft, nontender, nondistended, BS positive. CNS.  Alert and oriented .  No focal neurologic  deficit. Extremities.  No edema,  pulses intact and symmetrical. Psychiatry.  Judgment and insight appears normal.   Data Reviewed: Prior data reviewed  Family Communication: Talked with daughter on phone.  Disposition: Status is: Inpatient Remains inpatient appropriate because: Severity of illness  Planned Discharge Destination: Home  DVT prophylaxis.  Heparin  infusion Time spent: 50 minutes  This record has been created using Conservation officer, historic buildings. Errors have been sought and corrected,but may not always be located. Such creation errors do not reflect on the standard of care.   Author: Amaryllis Dare, MD 12/23/2023 3:52 PM  For on call review [email or web address].

## 2023-12-23 NOTE — Evaluation (Signed)
 Physical Therapy Evaluation Patient Details Name: Cameron Foster. MRN: [id number] DOB: Feb 08, 1941 Today's Date: 12/23/2023  History of Present Illness  Cameron Foster. is a 83 y.o. male with medical history significant of remote history of alcoholism and tobacco abuse, paroxysmal atrial fibrillation, ascending aortic aneurysm, prior history of GI bleed, history of subarachnoid hemorrhage, basal cell carcinoma,Hyperlipidemia, essential hypertension, depression with anxiety, history of diastolic CHF with last EF of 55 to 60% in January 2024 who presents to the ER with palpitation. He is admited for afib with RVR.   Clinical Impression  Patient reclining in bed and agreeable to PT evaluation. He lives alone in a single story level entry home. Before coming to the hospital, he ambulated in his home with no AD but used the walls and furnature for support at times. In the community he used his SPC. He drives and denies falls in the last 6 months. Someone cleans his house once a month and his sister visits him weekly and calls him nightly. He is mod I with ADLs but wants to get set up with assisted living because he struggles with it. Upon PT evaluation, he was mod I for bed mobility and completed transfers and ambulation with CGA-supervision with RW. He needed cuing to remember to position his body and AD correctly and fatigued by the end of a 300 foot walk. Patient appears to have limited functional capacity and independence with deficits in strength and balance which places him at an increased fall risk. Patient would benefit from skilled physical therapy to address impairments and functional limitations (see PT Problem List below) to work towards stated goals and return to PLOF or maximal functional independence.          If plan is discharge home, recommend the following: A little help with walking and/or transfers;A little help with bathing/dressing/bathroom;Assistance with cooking/housework;Assist  for transportation;Help with stairs or ramp for entrance   Can travel by private vehicle        Equipment Recommendations None recommended by PT  Recommendations for Other Services       Functional Status Assessment Patient has had a recent decline in their functional status and demonstrates the ability to make significant improvements in function in a reasonable and predictable amount of time.     Precautions / Restrictions Precautions Precautions: Fall      Mobility  Bed Mobility Overal bed mobility: Modified Independent             General bed mobility comments: head of bed elevated like his bed at home    Transfers Overall transfer level: Needs assistance Equipment used: Rolling walker (2 wheels) Transfers: Sit to/from Stand Sit to Stand: Supervision           General transfer comment: does not reach back or push off as reccomended and needs cues for this. Completed bed to chair, chair to toilet (with grab bar), and toilet to chair.    Ambulation/Gait Ambulation/Gait assistance: Supervision, Contact guard assist Gait Distance (Feet): 300 Feet Assistive device: Rolling walker (2 wheels) Gait Pattern/deviations: Step-through pattern, Decreased stride length, Trunk flexed       General Gait Details: Patient with slighly uneven gait and stooped posture with RW and supervision for safety. fatigued by end of ambulation.  Stairs            Wheelchair Mobility     Tilt Bed    Modified Rankin (Stroke Patients Only)       Balance Overall  balance assessment: Needs assistance   Sitting balance-Leahy Scale: Good       Standing balance-Leahy Scale: Good Standing balance comment: Patient able to stand and complete pericare and hand washing and adjust clothing without UE support but is reliant on RW when ambulating.                             Pertinent Vitals/Pain Pain Assessment Pain Assessment: Faces Faces Pain Scale: Hurts a  little bit Pain Location: L shoulder Pain Descriptors / Indicators: Sore Pain Intervention(s): Limited activity within patient's tolerance, Monitored during session, Repositioned    Home Living Family/patient expects to be discharged to:: Private residence Living Arrangements: Alone Available Help at Discharge: Family;Available PRN/intermittently (sister visits 1x a week and calls him nightly) Type of Home: House Home Access: Level entry       Home Layout: One level Home Equipment: Agricultural consultant (2 wheels);Rollator (4 wheels);Cane - single point Additional Comments: stool in shower that he does't use    Prior Function Prior Level of Function : Needs assist;Driving             Mobility Comments: mod I with mobility using walls/furnature for support in the home and [facility] in the community. Tries to walk a block but gets too tired. ADLs Comments: Has someone clean his home once a month. Wishes he could go to assisted living because it is hard for him to do ADLs and IADLs but he does them at baseline mod I     Extremity/Trunk Assessment   Upper Extremity Assessment Upper Extremity Assessment: Generalized weakness    Lower Extremity Assessment Lower Extremity Assessment: Generalized weakness    Cervical / Trunk Assessment Cervical / Trunk Assessment: Kyphotic  Communication   Communication Communication: No apparent difficulties    Cognition Arousal: Alert Behavior During Therapy: WFL for tasks assessed/performed   PT - Cognitive impairments: No apparent impairments                         Following commands: Intact       Cueing Cueing Techniques: Verbal cues, Visual cues, Gestural cues     General Comments General comments (skin integrity, edema, etc.): HR 70 bpm and SpO2 95% sitting at start of session; HR 92 bpm and SpO2 95% sitting after ambulation;    Exercises Other Exercises Other Exercises: pt educated on role of PT in acute care setting,  practiced safe transfer and AD handling techniques.   Assessment/Plan    PT Assessment Patient needs continued PT services  PT Problem List Decreased strength;Decreased mobility;Decreased activity tolerance;Cardiopulmonary status limiting activity;Decreased balance;Decreased knowledge of use of DME;Pain;Obesity       PT Treatment Interventions DME instruction;Gait training;Functional mobility training;Therapeutic activities;Patient/family education;Neuromuscular re-education;Balance training;Therapeutic exercise    PT Goals (Current goals can be found in the Care Plan section)  Acute Rehab PT Goals Patient Stated Goal: would like to get set up with assisted living PT Goal Formulation: With patient Time For Goal Achievement: 01/06/24 Potential to Achieve Goals: Fair    Frequency Min 2X/week     Co-evaluation               AM-PAC PT 6 Clicks Mobility  Outcome Measure Help needed turning from your back to your side while in a flat bed without using bedrails?: None Help needed moving from lying on your back to sitting on the side of a flat bed  without using bedrails?: None Help needed moving to and from a bed to a chair (including a wheelchair)?: A Little Help needed standing up from a chair using your arms (e.g., wheelchair or bedside chair)?: A Little Help needed to walk in hospital room?: A Little Help needed climbing 3-5 steps with a railing? : A Lot 6 Click Score: 19    End of Session Equipment Utilized During Treatment: Gait belt Activity Tolerance: Patient tolerated treatment well;Patient limited by fatigue Patient left: in chair;with call bell/phone within reach;with chair alarm set Nurse Communication: Mobility status PT Visit Diagnosis: Other abnormalities of gait and mobility (R26.89);Muscle weakness (generalized) (M62.81)    Time: 8860-8789 PT Time Calculation (min) (ACUTE ONLY): 31 min   Charges:   PT Evaluation $PT Eval Low Complexity: 1 Low PT  Treatments $Therapeutic Activity: 8-22 mins PT General Charges $$ ACUTE PT VISIT: 1 Visit         Camie SAUNDERS. Juli, PT, DPT, Cert. MDT 12/23/23, 12:33 PM

## 2023-12-23 NOTE — Assessment & Plan Note (Signed)
 Creatinine with another slight increase to 1.25. - Monitor renal function -Avoid nephrotoxins

## 2023-12-23 NOTE — Care Management Important Message (Signed)
 Important Message  Patient Details  Name: Cameron Foster. MRN: [id number] Date of Birth: 1941-02-18   Important Message Given:  Yes - Medicare IM     [name] 12/23/2023, 2:12 PM

## 2023-12-23 NOTE — Progress Notes (Signed)
  Progress Note  Patient Name: Cameron Foster. Date of Encounter: 12/23/2023 [facility] Cardiologist: Dr. Mady  Interval Summary    He remains in Afib with rates in the 80s. No chest pain or SOB.   Vital Signs Vitals:   12/22/23 2305 12/22/23 2347 12/23/23 0452 12/23/23 0733  BP: 125/86 115/78 (!) 133/90 123/88  Pulse: 63 66 (!) 48 85  Resp: 20  20   Temp: 98.1 F (36.7 C)  97.9 F (36.6 C) 97.9 F (36.6 C)  TempSrc:   Oral Oral  SpO2: 98%  95% 95%  Weight:      Height:        Intake/Output Summary (Last 24 hours) at 12/23/2023 0843 Last data filed at 12/23/2023 0700 Gross per 24 hour  Intake 96.88 ml  Output 1650 ml  Net -1553.12 ml      12/20/2023   10:43 PM 11/27/2023    1:16 PM 09/21/2023    2:20 PM  Last 3 Weights  Weight (lbs) 210 lb 219 lb 14.4 oz 211 lb 12.8 oz  Weight (kg) 95.255 kg 99.746 kg 96.072 kg      Telemetry/ECG  Afib 80s, up to 140 at times - Personally Reviewed  Physical Exam  GEN: No acute distress.   Neck: No JVD Cardiac: Irreg Irreg, no murmurs, rubs, or gallops.  Respiratory: Clear to auscultation bilaterally. GI: Soft, nontender, non-distended  MS: No edema  Assessment & Plan   Paroxysmal Afib - h/o afib in 2023 during hospitalization for colitis and GIB, he was initially started on Eliquis , but this was stopped due to GIB and no recurrent afib - he presented with palpitations found to be in rapid Afib started on IV dilt - he remaines in Afib with rates in the 80s - he is asymptomatic - echo showed normal LVEF - keep Mag>2 and K>4 - CAHDSVASC at least 6. No recent bleeding issues. Started on Eliquis  5mg  BID - IV cardizem  changed to 60mg  Q6H>will change to Cardizem  240mg  daily - lopressor  100mg  BID. - if he does not self convert may need DCCV, can likely be as outpatient as he is asymptomatic   Alcohol use - he drinks at least 2 drink daily - cessation recommended   HFmrEF - EF was low in 2023 in the setting of  Afib - repeat echo in 2024 showed normal LVEF - he has chronic right lower leg edema - repeat echo showed LVEF 55-60%, no WMA, mild LVH   CKD stage 3 - Scr at baseline   HTN - BP normal   For questions or updates, please contact [facility] Please consult [email or web address] for contact info under       Signed, Cameron Mach VEAR Fishman, PA-C

## 2023-12-24 ENCOUNTER — Other Ambulatory Visit: Payer: Self-pay

## 2023-12-24 DIAGNOSIS — E785 Hyperlipidemia, unspecified: Secondary | ICD-10-CM | POA: Diagnosis not present

## 2023-12-24 DIAGNOSIS — Z8679 Personal history of other diseases of the circulatory system: Secondary | ICD-10-CM | POA: Diagnosis not present

## 2023-12-24 DIAGNOSIS — I4891 Unspecified atrial fibrillation: Secondary | ICD-10-CM | POA: Diagnosis not present

## 2023-12-24 DIAGNOSIS — I480 Paroxysmal atrial fibrillation: Secondary | ICD-10-CM | POA: Diagnosis not present

## 2023-12-24 DIAGNOSIS — Z8719 Personal history of other diseases of the digestive system: Secondary | ICD-10-CM | POA: Diagnosis not present

## 2023-12-24 DIAGNOSIS — I10 Essential (primary) hypertension: Secondary | ICD-10-CM | POA: Diagnosis not present

## 2023-12-24 DIAGNOSIS — I5030 Unspecified diastolic (congestive) heart failure: Secondary | ICD-10-CM | POA: Diagnosis not present

## 2023-12-24 LAB — BASIC METABOLIC PANEL WITH GFR
Anion gap: 7 (ref 5–15)
BUN: 21 mg/dL (ref 8–23)
CO2: 26 mmol/L (ref 22–32)
Calcium: 9.2 mg/dL (ref 8.9–10.3)
Chloride: 105 mmol/L (ref 98–111)
Creatinine, Ser: 1.55 mg/dL — ABNORMAL HIGH (ref 0.61–1.24)
GFR, Estimated: 44 mL/min — ABNORMAL LOW (ref >60)
Glucose, Bld: 109 mg/dL — ABNORMAL HIGH (ref 70–99)
Potassium: 4.5 mmol/L (ref 3.5–5.1)
Sodium: 138 mmol/L (ref 135–145)

## 2023-12-24 MED ORDER — FOLIC ACID 1 MG PO TABS
1.0000 mg | ORAL_TABLET | Freq: Every day | ORAL | 0 refills | Status: DC
  Filled 2023-12-24: qty 90, 90d supply, fill #0

## 2023-12-24 MED ORDER — DILTIAZEM HCL ER COATED BEADS 120 MG PO CP24
120.0000 mg | ORAL_CAPSULE | Freq: Every day | ORAL | 2 refills | Status: DC
  Filled 2023-12-24 – 2023-12-25 (×2): qty 30, 30d supply, fill #0

## 2023-12-24 MED ORDER — APIXABAN 5 MG PO TABS
5.0000 mg | ORAL_TABLET | Freq: Two times a day (BID) | ORAL | 1 refills | Status: DC
  Filled 2023-12-24 – 2023-12-25 (×2): qty 60, 30d supply, fill #0

## 2023-12-24 NOTE — Progress Notes (Signed)
  Progress Note  Patient Name: Cameron Foster. Date of Encounter: 12/24/2023 [facility] Cardiologist: Timothy Gollan, MD   Interval Summary    Patient remains in Afib with controlled rates. He denies chest pain or SOB.   Vital Signs Vitals:   12/23/23 2111 12/23/23 2200 12/23/23 2357 12/24/23 0421  BP: (!) 119/91 (!) 119/91 (!) 135/99 123/85  Pulse: (!) 54 (!) 109 79   Resp:   20 20  Temp:   98.3 F (36.8 C) 97.9 F (36.6 C)  TempSrc:      SpO2:   95% 97%  Weight:      Height:        Intake/Output Summary (Last 24 hours) at 12/24/2023 0820 Last data filed at 12/24/2023 9357 Gross per 24 hour  Intake 310.96 ml  Output 1450 ml  Net -1139.04 ml      12/20/2023   10:43 PM 11/27/2023    1:16 PM 09/21/2023    2:20 PM  Last 3 Weights  Weight (lbs) 210 lb 219 lb 14.4 oz 211 lb 12.8 oz  Weight (kg) 95.255 kg 99.746 kg 96.072 kg      Telemetry/ECG  Afib 80-90s - Personally Reviewed  Physical Exam  GEN: No acute distress.   Neck: No JVD Cardiac: RRR, no murmurs, rubs, or gallops.  Respiratory: Clear to auscultation bilaterally. GI: Soft, nontender, non-distended  MS: No edema  Assessment & Plan   Paroxysmal Afib - h/o afib in 2023 during hospitalization for colitis and GIB, he was initially started on Eliquis , but this was stopped due to GIB and no recurrent afib - he presented with palpitations found to be in rapid Afib started on IV dilt - he remaines in Afib with rates in the 80s - he is asymptomatic - echo showed normal LVEF - keep Mag>2 and K>4 - CAHDSVASC at least 6. No recent bleeding issues. Started on Eliquis  5mg  BID - Cardizem  120mg  daily - lopressor  100mg  BID. - plan to d/c and undergo DCCV after 4 weeks of uninterrupted a/c   Alcohol use - he drinks at least 2 drink daily - cessation recommended   HFmrEF - EF was low in 2023 in the setting of Afib - repeat echo in 2024 showed normal LVEF - he has chronic right lower leg edema - repeat  echo showed LVEF 55-60%, no WMA, mild LVH   CKD stage 3 - Scr mildly elevated   HTN - BP normal    For questions or updates, please contact [facility] Please consult [email or web address] for contact info under       Signed, Jade Burright VEAR Fishman, PA-C

## 2023-12-24 NOTE — TOC Progression Note (Signed)
 Transition of Care (TOC) - Progression Note    Patient Details  Name: Cameron Foster. MRN: [id number] Date of Birth: 23-Mar-1941  Transition of Care [facility]) CM/SW Contact  [place] L Deb [place], [place] Phone Number: 12/24/2023, 11:01 AM  Clinical Narrative:     Discharge order in. CSW contacted the patients sister, Norvel, to discuss recommendations for home care. Norvel advised that she would like [facility]) to provide services. Leopoldo would be the back up provider if [facility] is unable to service patient.   DME needs discussed. Norvel advised that patient could benefit from having  RW. She stated that he is using one but she will have to take it from him to use for herself. Norvel agreeable to using [facility] to fill supply order for RW. Attending notified to entered order.   Norvel will be providing transportation for discharge. She expressed concerns that patient resides alone however, she stated that family visits with patient once a day.   CSW contacted [facility] and spoke with Shaun. SOC for patient will be within 48 hours for PT, OT and RN.   RW ordered through [facility].                    Expected Discharge Plan and Services         Expected Discharge Date: 12/24/23                                     Social Drivers of Health (SDOH) Interventions SDOH Screenings   Food Insecurity: Patient Declined (12/22/2023)  Housing: Patient Declined (12/22/2023)  Transportation Needs: Patient Declined (12/22/2023)  Utilities: Patient Declined (12/22/2023)  Alcohol Screen: Low Risk  (12/11/2023)  Depression (PHQ2-9): Medium Risk (12/11/2023)  Financial Resource Strain: Low Risk  (11/24/2023)  Physical Activity: Inactive (11/24/2023)  Social Connections: Patient Declined (12/22/2023)  Stress: Stress Concern Present (11/24/2023)  Tobacco Use: High Risk (12/20/2023)  Health Literacy: Adequate Health Literacy (05/31/2023)    Readmission Risk  Interventions     No data to display

## 2023-12-24 NOTE — Discharge Summary (Signed)
 Physician Discharge Summary   Patient: Cameron Foster. MRN: [id number] DOB: [birth year]  Admit date:     12/20/2023  Discharge date: 12/24/23  Discharge Physician: Amaryllis Dare   PCP: Myrla Jon [name], MD   Recommendations at discharge:  Please obtain CBC and BMP and follow-up Follow-up with cardiology Follow-up with primary care provider  Discharge Diagnoses: Principal Problem:   Atrial fibrillation with RVR (HCC) Active Problems:   Essential hypertension   History of GI bleed   History of subarachnoid hemorrhage   Hypokalemia   Hyperlipidemia LDL goal <70   Stage 3a chronic kidney disease (HCC)   Alcoholism (HCC)   History of colitis   Hospital Course: Partly taken from H&P.   Cameron Foster. is a 83 y.o. male with medical history significant of remote history of alcoholism and tobacco abuse, paroxysmal atrial fibrillation, ascending aortic aneurysm, prior history of GI bleed, history of subarachnoid hemorrhage, basal cell carcinoma,Hyperlipidemia, essential hypertension, depression with anxiety, history of diastolic CHF with last EF of 55 to 60% in January 2024 who presents to the ER with palpitation.  Denied any chest pain.   On presentation patient was found to be in A-fib with RVR, heart rate in 160s, poor response to initial Cardizem  and beta-blocker bolus.  Labs with mild AKI with creatinine at 1.51, troponin 21, TSH 4.023, potassium 3.3, magnesium  2.2.  Patient was started on heparin  and Cardizem  infusion.  Cardiology was consulted.  Echocardiogram ordered.  9/4: Remained in RVR, heart rate in 120s, AKI resolved, potassium improved to 3.9, lipid panel mostly normal, triglyceride of 167.  9/5: Rate improved, now mostly in 90s, echocardiogram with EF of 55 to 60%, no RWMA and indeterminate diastolic parameters. Cardizem  infusion was switched with p.o. Cardizem  and heparin  infusion is being switched with Eliquis .  9/6: Patient initially with some concern of  bradycardia and sinus pauses, blood pressure was soft-Cardizem  dose was held, later heart rate and blood pressure both improved and cardiology decreased the p.o. Cardizem  dose to 120 mg instead of 240 mg and would like to keep him for another day. Remained in A-fib, cardiology will likely do outpatient cardioversion in 2 to 3 weeks if remained in A-fib.  9/7: Patient remained hemodynamically stable and heart rate remained well-controlled, still in A-fib.  He is being discharged on Cardizem  120 mg and metoprolol  100 mg twice daily.  Also started on Eliquis  and home aspirin  was discontinued.  Home antihypertensives were discontinued as blood pressure seems controlled with current regimen.  Patient might need outpatient cardioversion if remained in A-fib. He needed close monitoring by primary care provider and cardiology for further assistance.  They can add further antihypertensives as needed as outpatient.  Assessment and Plan: * Atrial fibrillation with RVR (HCC) History of paroxysmal atrial fibrillation, Electrolytes stable and TSH normal.  Heart rate now improved but remained in atrial fibrillation, earlier some concern of couple of short interval sinus pauses and bradycardia - P.o. Cardizem  dose was decreased to 120 mg daily -Continue with Eliquis  -Continue with metoprolol  -Echocardiogram with normal EF and no regional wall motion abnormalities. - Cardiology is not recommending outpatient cardioversion in 2 to 3 weeks if remained in A-fib  Essential hypertension Blood pressure now within goal, mildly softer blood pressure earlier Home amlodipine  has been switched with Cardizem  Continue with metoprolol   History of GI bleed No history of recurrent colitis since 2023. No history of any recent bleed. - Continue to monitor as patient need anticoagulation now  History of subarachnoid hemorrhage No acute concern.  Has history of remote subarachnoid hemorrhage.  No recurrent  falls.  Hypokalemia Improved with repletion. - Continue to monitor and replete as needed  Hyperlipidemia LDL goal <70 - Continue with statin  Stage 3a chronic kidney disease (HCC) Creatinine with another slight increase to 1.25. - Monitor renal function -Avoid nephrotoxins  Alcoholism (HCC) History of significant alcohol use on a daily basis. Patient uses this for anxiety and sleeping. - CIWA protocol -Thiamine  supplement -Counseling was provided   Consultants: Cardiology Procedures performed: None Disposition: Home health Diet recommendation:  Discharge Diet Orders (From admission, onward)     Start     Ordered   12/24/23 0000  Diet - low sodium heart healthy        12/24/23 1042           Cardiac diet DISCHARGE MEDICATION: Allergies as of 12/24/2023       Reactions   Amoxicillin -pot Clavulanate Other (See Comments)   Abdominal upset Has patient had a PCN reaction causing immediate rash, facial/tongue/throat swelling, SOB or lightheadedness with hypotension: Unknown Has patient had a PCN reaction causing severe rash involving mucus membranes or skin necrosis: Unknown Has patient had a PCN reaction that required hospitalization: Unknown Has patient had a PCN reaction occurring within the last 10 years: Unknown If all of the above answers are NO, then may proceed with Cephalosporin use. amoxicillin  / clavulanate   Formaldehyde Rash   From shoes made with this material        Medication List     STOP taking these medications    amLODipine  5 MG tablet Commonly known as: NORVASC    aspirin  81 MG chewable tablet   lisinopril -hydrochlorothiazide  10-12.5 MG tablet Commonly known as: ZESTORETIC        TAKE these medications    albuterol  108 (90 Base) MCG/ACT inhaler Commonly known as: VENTOLIN  HFA Inhale 2 puffs into the lungs every 6 (six) hours as needed for wheezing or shortness of breath.   Alpha-Lipoic Acid 600 MG Tabs Take 1 tablet by mouth  daily.   atorvastatin  40 MG tablet Commonly known as: LIPITOR TAKE 1 TABLET BY MOUTH EVERY DAY   cetirizine  10 MG tablet Commonly known as: ZYRTEC  Take 10 mg by mouth daily.   cyanocobalamin  1000 MCG tablet Commonly known as: VITAMIN B12 Take 1,000 mcg by mouth daily.   diltiazem  120 MG 24 hr capsule Commonly known as: CARDIZEM  CD Take 1 capsule (120 mg total) by mouth daily.   docusate sodium  100 MG capsule Commonly known as: COLACE Take 1 capsule (100 mg total) by mouth 2 (two) times daily. What changed:  how much to take when to take this   Eliquis  5 MG Tabs tablet Generic drug: apixaban  Take 1 tablet (5 mg total) by mouth 2 (two) times daily.   fluticasone  50 MCG/ACT nasal spray Commonly known as: FLONASE  PLACE 1 SPRAY INTO BOTH NOSTRILS DAILY AS NEEDED FOR ALLERGIES OR RHINITIS.   folic acid  1 MG tablet Commonly known as: FOLVITE  Take 1 tablet (1 mg total) by mouth daily.   hydrocortisone  2.5 % cream APPLY TO AFFECTED AREA TWICE A DAY AS NEEDED FOR RASH   ketoconazole  2 % cream Commonly known as: NIZORAL  Apply once or twice daily to affected skin folds as needed for rash   metoprolol  tartrate 100 MG tablet Commonly known as: LOPRESSOR  TAKE 1 TABLET BY MOUTH TWICE A DAY   multivitamin with minerals Tabs tablet Take 1 tablet by  mouth daily.   omeprazole  20 MG capsule Commonly known as: PRILOSEC TAKE 1 CAPSULE (20 MG TOTAL) BY MOUTH DAILY. REPORTS CURRENTLY TAKING AS NEEDED   pregabalin  25 MG capsule Commonly known as: LYRICA  Take 25 mg by mouth 2 (two) times daily.   sertraline  100 MG tablet Commonly known as: ZOLOFT  Take 1 tablet (100 mg total) by mouth daily.   tadalafil  5 MG tablet Commonly known as: CIALIS  Take 1 tablet (5 mg total) by mouth daily as needed for erectile dysfunction.   thiamine  100 MG tablet Commonly known as: VITAMIN B1 Take 100 mg by mouth daily.   Vitamin D-3 25 MCG (1000 UT) Caps Take 1,000 Units by mouth daily.    zinc gluconate 50 MG tablet Take 50 mg by mouth daily.               Durable Medical Equipment  (From admission, onward)           Start     Ordered   12/24/23 1103  For home use only DME Walker rolling  Once       Question Answer Comment  Walker: With 5 Inch Wheels   Patient needs a walker to treat with the following condition Generalized weakness      12/24/23 1102            Follow-up Information     Bacigalupo, Jon [name], MD. Schedule an appointment as soon as possible for a visit in 1 week(s).   Specialty: Family Medicine Contact information: [address] [place] [address] [phone]         Perla Evalene PARAS, MD. Schedule an appointment as soon as possible for a visit in 1 week(s).   Specialty: Cardiology Contact information: [address] [place] [address] [place] [address] [phone]                Discharge Exam: Fredricka Weights   12/20/23 2243  Weight: 95.3 kg   General.  Obese elderly man, in no acute distress. Pulmonary.  Lungs clear bilaterally, normal respiratory effort. CV.  Irregularly irregular, rate controlled Abdomen.  Soft, nontender, nondistended, BS positive. CNS.  Alert and oriented .  No focal neurologic deficit. Extremities.  No edema, no cyanosis, pulses intact and symmetrical. Psychiatry.  Judgment and insight appears normal.   Condition at discharge: stable  The results of significant diagnostics from this hospitalization (including imaging, microbiology, ancillary and laboratory) are listed below for reference.   Imaging Studies: ECHOCARDIOGRAM COMPLETE Result Date: 12/22/2023    ECHOCARDIOGRAM REPORT   Patient Name:   Azahel Belcastro. Date of Exam: 12/22/2023 Medical Rec #:  [id number]          Height:       69.0 in Accession #:    [id number]         Weight:       210.0 lb Date of Birth:  07-09-1940          BSA:          2.109 m Patient Age:    83 years           BP:           125/98 mmHg  Patient Gender: M                  HR:           75 bpm. Exam Location:  [facility] Procedure: 2D Echo, Cardiac Doppler and Color  Doppler (Both Spectral and Color            Flow Doppler were utilized during procedure). Indications:     Atrial Fibrillation I48.91  History:         Patient has prior history of Echocardiogram examinations, most                  recent 04/27/2022. Arrythmias:Atrial Fibrillation.  Sonographer:     Rosina Dunk Referring Phys:  [id number] JINNY LUNGER Diagnosing Phys: Redell Cave MD IMPRESSIONS  1. Left ventricular ejection fraction, by estimation, is 55 to 60%. Left ventricular ejection fraction by PLAX is 66 %. The left ventricle has normal function. The left ventricle has no regional wall motion abnormalities. There is mild left ventricular hypertrophy. Left ventricular diastolic parameters are indeterminate.  2. Right ventricular systolic function is normal. The right ventricular size is normal.  3. The mitral valve is normal in structure. No evidence of mitral valve regurgitation.  4. The aortic valve was not well visualized. Aortic valve regurgitation is not visualized. FINDINGS  Left Ventricle: Left ventricular ejection fraction, by estimation, is 55 to 60%. Left ventricular ejection fraction by PLAX is 66 %. The left ventricle has normal function. The left ventricle has no regional wall motion abnormalities. The left ventricular internal cavity size was normal in size. There is mild left ventricular hypertrophy. Left ventricular diastolic parameters are indeterminate. Right Ventricle: The right ventricular size is normal. No increase in right ventricular wall thickness. Right ventricular systolic function is normal. Left Atrium: Left atrial size was normal in size. Right Atrium: Right atrial size was normal in size. Pericardium: There is no evidence of pericardial effusion. Mitral Valve: The mitral valve is normal in structure. No evidence of mitral valve regurgitation. MV  peak gradient, 4.2 mmHg. The mean mitral valve gradient is 2.0 mmHg. Tricuspid Valve: The tricuspid valve is normal in structure. Tricuspid valve regurgitation is not demonstrated. Aortic Valve: The aortic valve was not well visualized. Aortic valve regurgitation is not visualized. Aortic valve mean gradient measures 1.0 mmHg. Aortic valve peak gradient measures 2.6 mmHg. Aortic valve area, by VTI measures 2.38 cm. Pulmonic Valve: The pulmonic valve was not well visualized. Pulmonic valve regurgitation is not visualized. Aorta: The aortic root is normal in size and structure. Venous: The inferior vena cava was not well visualized. IAS/Shunts: No atrial level shunt detected by color flow Doppler.  LEFT VENTRICLE PLAX 2D LV EF:         Left            Diastology                ventricular     LV e' medial:    8.59 cm/s                ejection        LV E/e' medial:  10.2                fraction by     LV e' lateral:   10.00 cm/s                PLAX is 66      LV E/e' lateral: 8.8                %. LVIDd:         4.70 cm LVIDs:         3.00 cm LV PW:  1.40 cm LV IVS:        1.20 cm LVOT diam:     2.20 cm LV SV:         37 LV SV Index:   18 LVOT Area:     3.80 cm  LV Volumes (MOD) LV vol d, MOD    71.6 ml A2C: LV vol d, MOD    52.5 ml A4C: LV vol s, MOD    60.7 ml A2C: LV vol s, MOD    40.6 ml A4C: LV SV MOD A2C:   10.9 ml LV SV MOD A4C:   52.5 ml LV SV MOD BP:    15.1 ml RIGHT VENTRICLE RV Basal diam:  3.20 cm RV Mid diam:    2.10 cm RV S prime:     9.90 cm/s LEFT ATRIUM             Index        RIGHT ATRIUM           Index LA diam:        4.20 cm 1.99 cm/m   RA Area:     15.20 cm LA Vol (A2C):   74.6 ml 35.37 ml/m  RA Volume:   34.90 ml  16.55 ml/m LA Vol (A4C):   55.7 ml 26.41 ml/m LA Biplane Vol: 65.9 ml 31.24 ml/m  AORTIC VALVE                    PULMONIC VALVE AV Area (Vmax):    3.06 cm     PV Vmax:        0.80 m/s AV Area (Vmean):   2.78 cm     PV Vmean:       55.000 cm/s AV Area (VTI):     2.38  cm     PV VTI:         0.131 m AV Vmax:           80.10 cm/s   PV Peak grad:   2.6 mmHg AV Vmean:          54.400 cm/s  PV Mean grad:   1.0 mmHg AV VTI:            0.156 m      RVOT Peak grad: 1 mmHg AV Peak Grad:      2.6 mmHg AV Mean Grad:      1.0 mmHg LVOT Vmax:         64.50 cm/s LVOT Vmean:        39.800 cm/s LVOT VTI:          0.098 m LVOT/AV VTI ratio: 0.63  AORTA Ao Root diam: 3.60 cm Ao Asc diam:  2.80 cm MITRAL VALVE MV Area (PHT): 6.54 cm    SHUNTS MV Area VTI:   3.72 cm    Systemic VTI:  0.10 m MV Peak grad:  4.2 mmHg    Systemic Diam: 2.20 cm MV Mean grad:  2.0 mmHg    Pulmonic VTI:  0.071 m MV Vmax:       1.02 m/s MV Vmean:      65.1 cm/s MV Decel Time: 116 msec MV E velocity: 88.00 cm/s Redell Cave MD Electronically signed by Redell Cave MD Signature Date/Time: 12/22/2023/4:04:52 PM    Final    DG Chest Port 1 View Result Date: 12/20/2023 CLINICAL DATA:  Tachycardia EXAM: PORTABLE CHEST 1 VIEW COMPARISON:  None Available. FINDINGS: Lungs are well expanded, symmetric, and clear.  No pneumothorax or pleural effusion. Cardiac size within normal limits. Pulmonary vascularity is normal. Osseous structures are age-appropriate. No acute bone abnormality. IMPRESSION: 1. No active disease. Electronically Signed   By: Dorethia Molt M.D.   On: 12/20/2023 23:54    Microbiology: Results for orders placed or performed during the hospital encounter of 09/12/23  Resp panel by RT-PCR (RSV, Flu A&B, Covid) Anterior Nasal Swab     Status: None   Collection Time: 09/12/23 11:19 AM   Specimen: Anterior Nasal Swab  Result Value Ref Range Status   SARS Coronavirus 2 by RT PCR NEGATIVE NEGATIVE Final    Comment: (NOTE) SARS-CoV-2 target nucleic acids are NOT DETECTED.  The SARS-CoV-2 RNA is generally detectable in upper respiratory specimens during the acute phase of infection. The lowest concentration of SARS-CoV-2 viral copies this assay can detect is 138 copies/mL. A negative result does  not preclude SARS-Cov-2 infection and should not be used as the sole basis for treatment or other patient management decisions. A negative result may occur with  improper specimen collection/handling, submission of specimen other than nasopharyngeal swab, presence of viral mutation(s) within the areas targeted by this assay, and inadequate number of viral copies(<138 copies/mL). A negative result must be combined with clinical observations, patient history, and epidemiological information. The expected result is Negative.  Fact Sheet for Patients:  BloggerCourse.com  Fact Sheet for Healthcare Providers:  SeriousBroker.it  This test is no t yet approved or cleared by the [place]  FDA and  has been authorized for detection and/or diagnosis of SARS-CoV-2 by FDA under an Emergency Use Authorization (EUA). This EUA will remain  in effect (meaning this test can be used) for the duration of the COVID-19 declaration under Section 564(b)(1) of the Act, 21 U.S.C.section 360bbb-3(b)(1), unless the authorization is terminated  or revoked sooner.       Influenza A by PCR NEGATIVE NEGATIVE Final   Influenza B by PCR NEGATIVE NEGATIVE Final    Comment: (NOTE) The Xpert Xpress SARS-CoV-2/FLU/RSV plus assay is intended as an aid in the diagnosis of influenza from Nasopharyngeal swab specimens and should not be used as a sole basis for treatment. Nasal washings and aspirates are unacceptable for Xpert Xpress SARS-CoV-2/FLU/RSV testing.  Fact Sheet for Patients: BloggerCourse.com  Fact Sheet for Healthcare Providers: SeriousBroker.it  This test is not yet approved or cleared by the [place]  FDA and has been authorized for detection and/or diagnosis of SARS-CoV-2 by FDA under an Emergency Use Authorization (EUA). This EUA will remain in effect (meaning this test can be used) for the  duration of the COVID-19 declaration under Section 564(b)(1) of the Act, 21 U.S.C. section 360bbb-3(b)(1), unless the authorization is terminated or revoked.     Resp Syncytial Virus by PCR NEGATIVE NEGATIVE Final    Comment: (NOTE) Fact Sheet for Patients: BloggerCourse.com  Fact Sheet for Healthcare Providers: SeriousBroker.it  This test is not yet approved or cleared by the [place]  FDA and has been authorized for detection and/or diagnosis of SARS-CoV-2 by FDA under an Emergency Use Authorization (EUA). This EUA will remain in effect (meaning this test can be used) for the duration of the COVID-19 declaration under Section 564(b)(1) of the Act, 21 U.S.C. section 360bbb-3(b)(1), unless the authorization is terminated or revoked.  Performed at [facility], [address] [place], [place] [address]    *Note: Due to a large number of results and/or encounters for the requested time period, some results have not been displayed. A  complete set of results can be found in Results Review.    Labs: CBC: Recent Labs  Lab 12/20/23 2251 12/21/23 0050 12/22/23 1654 12/23/23 0930  WBC 9.2 9.4 9.3 7.7  HGB 12.0* 12.4* 12.5* 12.3*  HCT 37.0* 38.8* 38.3* 37.3*  MCV 92.7 93.3 92.5 91.4  PLT 234 232 228 216   Basic Metabolic Panel: Recent Labs  Lab 12/20/23 2251 12/21/23 0050 12/22/23 1654 12/23/23 0930 12/24/23 0517  NA 138 139 134* 137 138  K 3.3* 3.9 4.1 4.0 4.5  CL 104 104 101 104 105  CO2 23 26 25 26 26   GLUCOSE 111* 105* 115* 131* 109*  BUN 25* 23 17 18 21   CREATININE 1.51* 1.11 1.10 1.25* 1.55*  CALCIUM  9.3 9.3 9.4 9.2 9.2  MG 2.2  --   --   --   --    Liver Function Tests: No results for input(s): AST, ALT, ALKPHOS, BILITOT, PROT, ALBUMIN  in the last 168 hours. CBG: No results for input(s): GLUCAP in the last 168 hours.  Discharge time spent: greater than 30 minutes.  This  record has been created using Conservation officer, historic buildings. Errors have been sought and corrected,but may not always be located. Such creation errors do not reflect on the standard of care.   Signed: Amaryllis Dare, MD [facility] 12/24/2023

## 2023-12-24 NOTE — Plan of Care (Signed)

## 2023-12-25 ENCOUNTER — Telehealth: Payer: Self-pay | Admitting: Emergency Medicine

## 2023-12-25 ENCOUNTER — Other Ambulatory Visit (HOSPITAL_COMMUNITY): Payer: Self-pay

## 2023-12-25 ENCOUNTER — Ambulatory Visit: Admitting: Licensed Clinical Social Worker

## 2023-12-25 ENCOUNTER — Telehealth: Payer: Self-pay | Admitting: Licensed Clinical Social Worker

## 2023-12-25 ENCOUNTER — Other Ambulatory Visit: Payer: Self-pay

## 2023-12-25 ENCOUNTER — Telehealth: Payer: Self-pay | Admitting: Family Medicine

## 2023-12-25 ENCOUNTER — Telehealth: Payer: Self-pay

## 2023-12-25 MED ORDER — TADALAFIL 5 MG PO TABS: 5.0000 mg | ORAL_TABLET | ORAL | 5 refills | Status: DC | PRN

## 2023-12-25 NOTE — Transitions of Care (Post Inpatient/ED Visit) (Signed)
   12/25/2023  Name: Cameron Foster. MRN: [id number] DOB: 11-Mar-1941  Today's TOC FU Call Status: Today's TOC FU Call Status:: Unsuccessful Call (1st Attempt) Unsuccessful Call (1st Attempt) Date: 12/25/23  Attempted to reach the patient regarding the most recent Inpatient/ED visit. Spoke with sister who states patient unable to talk right now in the shower. Will plan a return call at a later time.  Follow Up Plan: Additional outreach attempts will be made to reach the patient to complete the Transitions of Care (Post Inpatient/ED visit) call.   [name], RN, BSN, CCM [facility], [facility] RN Care Manager Direct Dial: [phone]

## 2023-12-25 NOTE — Telephone Encounter (Signed)
 Copied from CRM [id number]. Topic: Appointments - Scheduling Inquiry for Clinic >> Dec 25, 2023  8:37 AM Jasmin G wrote: Reason for CRM: Pt needs to reschedule his appt previously today at 2:30 p.m, please call him back at [phone] to reschedule.

## 2023-12-25 NOTE — Telephone Encounter (Signed)
 Discussed rescheduling [facility] appointment--pt states he is very overwhelmed from recent hospital discharge and will call our office back to reschedule appointment.

## 2023-12-25 NOTE — Telephone Encounter (Signed)
 Called and spoke with the patient and his sister regarding A-fib and the conditions when he needs to go back to the ER.  Informed the patient and sister, that anytime the patient feels chest pain, shortness of breath, or dizziness, or if he has a fall, or racing heart rate that does not return to normal is when he would need to go back to the ER.  Patient's sister was concerned about leaving the patient alone, since he lives alone.  Advised them both that this is something that needs to be communicated with patient's primary care provider to ensure the safest alternative for the patient.  They both verbalized understanding with all questions and concerns addressed at this time.

## 2023-12-25 NOTE — Telephone Encounter (Signed)
-----   Message from Berwyn MATSU sent at 12/25/2023  8:42 AM EDT ----- Regarding: RE: hops follow-up Pt want to ask a few question about A Fib. ----- Message ----- From: Franchester Mikey DEL, PA-C Sent: 12/24/2023   8:22 AM EDT To: [facility] Triage; [facility] Scheduling Subject: hops follow-up                                 Patient needs hosp follow-up in 2 weeks

## 2023-12-26 ENCOUNTER — Ambulatory Visit (INDEPENDENT_AMBULATORY_CARE_PROVIDER_SITE_OTHER): Admitting: Family Medicine

## 2023-12-26 ENCOUNTER — Other Ambulatory Visit: Payer: Self-pay

## 2023-12-26 ENCOUNTER — Encounter: Payer: Self-pay | Admitting: Family Medicine

## 2023-12-26 ENCOUNTER — Telehealth: Payer: Self-pay

## 2023-12-26 VITALS — BP 85/64 | HR 70 | Ht 68.0 in | Wt 214.5 lb

## 2023-12-26 DIAGNOSIS — I69993 Ataxia following unspecified cerebrovascular disease: Secondary | ICD-10-CM | POA: Diagnosis not present

## 2023-12-26 DIAGNOSIS — I10 Essential (primary) hypertension: Secondary | ICD-10-CM

## 2023-12-26 DIAGNOSIS — R5383 Other fatigue: Secondary | ICD-10-CM

## 2023-12-26 DIAGNOSIS — I4891 Unspecified atrial fibrillation: Secondary | ICD-10-CM | POA: Diagnosis not present

## 2023-12-26 DIAGNOSIS — I952 Hypotension due to drugs: Secondary | ICD-10-CM

## 2023-12-26 DIAGNOSIS — Z23 Encounter for immunization: Secondary | ICD-10-CM | POA: Diagnosis not present

## 2023-12-26 DIAGNOSIS — G4733 Obstructive sleep apnea (adult) (pediatric): Secondary | ICD-10-CM | POA: Diagnosis not present

## 2023-12-26 DIAGNOSIS — I110 Hypertensive heart disease with heart failure: Secondary | ICD-10-CM

## 2023-12-26 MED ORDER — METOPROLOL TARTRATE 50 MG PO TABS
50.0000 mg | ORAL_TABLET | Freq: Two times a day (BID) | ORAL | 1 refills | Status: DC
  Filled 2023-12-26: qty 60, 30d supply, fill #0

## 2023-12-26 MED ORDER — METOPROLOL TARTRATE 75 MG PO TABS
75.0000 mg | ORAL_TABLET | Freq: Two times a day (BID) | ORAL | 1 refills | Status: DC
  Filled 2023-12-26 (×2): qty 60, 30d supply, fill #0

## 2023-12-26 MED ORDER — METOPROLOL TARTRATE 25 MG PO TABS
25.0000 mg | ORAL_TABLET | Freq: Two times a day (BID) | ORAL | 1 refills | Status: DC
  Filled 2023-12-26 (×2): qty 60, 30d supply, fill #0

## 2023-12-26 NOTE — Progress Notes (Signed)
 Established patient visit   Patient: Cameron Foster.   DOB: 17-Jan-1941   [name]  MRN: [id number] Visit Date: 12/26/2023  Today's healthcare provider: Jon Eva, MD   Chief Complaint  Patient presents with   Hospitalization Follow-up    Patient was admitted to [facility] on 12/20/23 and discharged on 12/24/23. He was treated for Atrial fibrillation with RVR. Treatment for this included patient was started on heparin  and Cardizem  infusion.  Cardiology was consulted.  Echocardiogram ordered. Cardizem  dose was decreased to 120 mg daily, continue Eliquis  and metoprolol . Telephone follow up was done on 09/08 He reports excellent compliance with treatment. He reports this condition is stayed the same. Still in A-fib   Sleep concerns    Patients sister reports he seems to be sleeping hard. States he is going to sleep about 30 minutes after laying down but then it is really hard to wake him up.    Home Care    Patient sister reports that she is concerned with leaving him home alone due to balance and wanting to see if they can get some help as home. She reports noticing sometimes he is having a hard time remembering things and other times he is really sharp at it.    Subjective    HPI HPI     Hospitalization Follow-up    Additional comments: Patient was admitted to [facility] on 12/20/23 and discharged on 12/24/23. He was treated for Atrial fibrillation with RVR. Treatment for this included patient was started on heparin  and Cardizem  infusion.  Cardiology was consulted.  Echocardiogram ordered. Cardizem  dose was decreased to 120 mg daily, continue Eliquis  and metoprolol . Telephone follow up was done on 09/08 He reports excellent compliance with treatment. He reports this condition is stayed the same. Still in A-fib        Sleep concerns    Additional comments: Patients sister reports he seems to be sleeping hard. States he is going to sleep about 30 minutes after laying down  but then it is really hard to wake him up.         Home Care    Additional comments: Patient sister reports that she is concerned with leaving him home alone due to balance and wanting to see if they can get some help as home. She reports noticing sometimes he is having a hard time remembering things and other times he is really sharp at it.       Last edited by Cameron Chretien M, MD on 12/26/2023 11:31 AM.       Discussed the use of AI scribe software for clinical note transcription with the patient, who gave verbal consent to proceed.  History of Present Illness   Cameron Foster is an 83 year old male with paroxysmal atrial fibrillation who presents for follow-up after hospitalization for atrial fibrillation with rapid ventricular response. He is accompanied by his sister, Cameron Foster.  He was hospitalized from September 3 to December 24, 2023, for atrial fibrillation with rapid ventricular response. His heart rate was in the 160s, and he had a poor response to initial Cardizem  and beta blocker bolus. During hospitalization, he experienced acute kidney injury and hypokalemia, both of which resolved. His heart rate improved on Cardizem  infusion, and he was transitioned to oral Cardizem  and Eliquis  after stabilization. He was discharged on Cardizem  120 mg daily, metoprolol  100 mg twice daily, and Eliquis , with aspirin  discontinued.  Since discharge, his pulse has  fluctuated between 42 and 130, with most readings being low. He feels tired and sleepy all the time, unable to sleep normally at night, and does not wake up refreshed. These symptoms were present before his hospital stay. He uses a CPAP machine for sleep apnea but does not notice a difference in his sleep quality. His sister notes that he sleeps soundly but does not wake up refreshed.  He has a history of using alcohol and tobacco. He used to drink a glass of wine or beer at night but has stopped since his recent atrial  fibrillation episode. He uses small amounts of tobacco four to five times a day and is attempting to reduce this habit. He lives alone and is considering personal care services for assistance with daily activities.         Medications: Outpatient Medications Prior to Visit  Medication Sig   albuterol  (VENTOLIN  HFA) 108 (90 Base) MCG/ACT inhaler Inhale 2 puffs into the lungs every 6 (six) hours as needed for wheezing or shortness of breath.   Alpha-Lipoic Acid 600 MG TABS Take 1 tablet by mouth daily.   apixaban  (ELIQUIS ) 5 MG TABS tablet Take 1 tablet (5 mg total) by mouth 2 (two) times daily.   atorvastatin  (LIPITOR) 40 MG tablet TAKE 1 TABLET BY MOUTH EVERY DAY   cetirizine  (ZYRTEC ) 10 MG tablet Take 10 mg by mouth daily.   Cholecalciferol (VITAMIN D-3) 25 MCG (1000 UT) CAPS Take 1,000 Units by mouth daily.   diltiazem  (CARDIZEM  CD) 120 MG 24 hr capsule Take 1 capsule (120 mg total) by mouth daily.   docusate sodium  (COLACE) 100 MG capsule Take 1 capsule (100 mg total) by mouth 2 (two) times daily. (Patient taking differently: Take 200 mg by mouth daily.)   fluticasone  (FLONASE ) 50 MCG/ACT nasal spray PLACE 1 SPRAY INTO BOTH NOSTRILS DAILY AS NEEDED FOR ALLERGIES OR RHINITIS.   folic acid  (FOLVITE ) 1 MG tablet Take 1 tablet (1 mg total) by mouth daily.   hydrocortisone  2.5 % cream APPLY TO AFFECTED AREA TWICE A DAY AS NEEDED FOR RASH   ketoconazole  (NIZORAL ) 2 % cream Apply once or twice daily to affected skin folds as needed for rash   Multiple Vitamin (MULTIVITAMIN WITH MINERALS) TABS tablet Take 1 tablet by mouth daily.   omeprazole  (PRILOSEC) 20 MG capsule TAKE 1 CAPSULE (20 MG TOTAL) BY MOUTH DAILY. REPORTS CURRENTLY TAKING AS NEEDED   sertraline  (ZOLOFT ) 100 MG tablet Take 1 tablet (100 mg total) by mouth daily.   tadalafil  (CIALIS ) 5 MG tablet Take 1 tablet (5 mg total) by mouth daily as needed for erectile dysfunction.   tadalafil  (CIALIS ) 5 MG tablet Take 1 tablet (5 mg total) by  mouth every other day as needed.   thiamine  (VITAMIN B1) 100 MG tablet Take 100 mg by mouth daily.   vitamin B-12 (CYANOCOBALAMIN ) 1000 MCG tablet Take 1,000 mcg by mouth daily.   zinc gluconate 50 MG tablet Take 50 mg by mouth daily.   [DISCONTINUED] metoprolol  tartrate (LOPRESSOR ) 100 MG tablet Take 1 tablet (100 mg total) by mouth 2 (two) times daily.   pregabalin  (LYRICA ) 25 MG capsule Take 25 mg by mouth 2 (two) times daily. (Patient not taking: Reported on 12/26/2023)   No facility-administered medications prior to visit.    Review of Systems     Objective    BP (!) 85/64   Pulse 70   Ht 5' 8 (1.727 Foster)   Wt 214 lb 8 oz (97.3  kg)   SpO2 100%   BMI 32.61 kg/Foster    Physical Exam Vitals reviewed.  Constitutional:      General: He is not in acute distress.    Appearance: Normal appearance. He is not diaphoretic.  HENT:     Head: Normocephalic and atraumatic.  Eyes:     General: No scleral icterus.    Conjunctiva/sclera: Conjunctivae normal.  Cardiovascular:     Rate and Rhythm: Bradycardia present.     Heart sounds: Normal heart sounds. No murmur heard.    Comments: Irreg irreg rhythm Pulmonary:     Effort: Pulmonary effort is normal. No respiratory distress.     Breath sounds: Normal breath sounds. No wheezing or rhonchi.  Musculoskeletal:     Cervical back: Neck supple.     Right lower leg: Edema present.     Left lower leg: Edema present.  Lymphadenopathy:     Cervical: No cervical adenopathy.  Skin:    General: Skin is warm and dry.  Neurological:     Mental Status: He is alert and oriented to person, place, and time. Mental status is at baseline.  Psychiatric:        Mood and Affect: Mood normal.        Behavior: Behavior normal.      No results found for any visits on 12/26/23.  Assessment & Plan     Problem List Items Addressed This Visit       Cardiovascular and Mediastinum   Essential hypertension   Relevant Medications   Metoprolol  Tartrate  75 MG TABS   Other Relevant Orders   AMB Referral VBCI Care Management   Atrial fibrillation with RVR (HCC) - Primary   Relevant Medications   Metoprolol  Tartrate 75 MG TABS   Other Relevant Orders   CBC w/Diff/Platelet   Basic Metabolic Panel (BMET)   AMB Referral VBCI Care Management     Respiratory   OSA (obstructive sleep apnea)   Relevant Orders   Ambulatory referral to Pulmonology   AMB Referral VBCI Care Management     Other   Fatigue   Relevant Orders   AMB Referral VBCI Care Management   Ataxia, late effect of cerebrovascular disease   Relevant Orders   AMB Referral VBCI Care Management   Other Visit Diagnoses       Hypertensive heart disease with congestive heart failure, unspecified heart failure type (HCC)       Relevant Medications   Metoprolol  Tartrate 75 MG TABS     Immunization due       Relevant Orders   Flu vaccine HIGH DOSE PF(Fluzone Trivalent) (Completed)     Hypotension due to drugs       Relevant Medications   Metoprolol  Tartrate 75 MG TABS           Atrial fibrillation with rapid ventricular response Recent hospitalization for AFib with RVR. Heart rate was in the 160s, with poor initial response to Cardizem  and beta blocker bolus. Transitioned from IV to PO Cardizem  and Eliquis  after rate control. Cardiology considering outpatient cardioversion in 2-3 weeks if AFib persists. Current heart rate fluctuates between 42 and 130 bpm. Cardizem  dose was decreased due to bradycardia and soft blood pressure. Echocardiogram showed normal EF and no regional wall abnormalities. Discussed the potential for cardioversion and the importance of anticoagulation with Eliquis  to prevent clot formation during the procedure. - Continue Cardizem  120 mg daily. - Reduce metoprolol  to 75 mg twice daily. - Continue Eliquis . -  Discontinue aspirin . - Follow up with cardiology next week for potential cardioversion.  Hypertensive heart disease with heart failure and  essential hypertension Blood pressure is low, contributing to fatigue. Current medications for AFib are also lowering blood pressure. Balancing heart rate control with maintaining adequate blood pressure is challenging. Discussed the need to adjust medications to find a balance between controlling heart rate and maintaining blood pressure. - Monitor blood pressure closely. - Adjust metoprolol  dose to 75 mg twice daily to help manage blood pressure.  Obstructive sleep apnea Uses CPAP with auto-adjusting pressure. Reports feeling tired despite using CPAP. Possible contribution of AFib to fatigue. CPAP settings may need adjustment due to discomfort with heated humidity. Discussed the potential impact of AFib on sleep quality and the possibility of adjusting CPAP settings to improve comfort. - Refer to Dr. Jess for sleep medicine consultation. - Change CPAP to regular humidity instead of heated humidity.  Fatigue Persistent fatigue, possibly related to low blood pressure and AFib. Sleep quality may also be a factor. Discussed the impact of low blood pressure and AFib on energy levels and the importance of optimizing treatment for both conditions. - Monitor blood pressure and adjust medications as needed. - Refer to Dr. Jess for evaluation of sleep apnea management.  General Health Maintenance Discussed flu vaccination. - Administer flu shot.  Follow-Up Coordination of care and follow-up appointments discussed. Emphasized the importance of close monitoring and follow-up with cardiology and sleep medicine to optimize management of AFib and sleep apnea. - Schedule one-month follow-up appointment. - Ensure cardiology follow-up next week. - Coordinate with nurse care manager for personal care services and potential assisted living arrangements. - Expect call from Dr. Jess regarding sleep apnea management.          Return in about 4 weeks (around 01/23/2024) for chronic disease f/u.        Jon Eva, MD  [facility] [phone] (phone) [phone] (fax)  [facility]

## 2023-12-26 NOTE — Transitions of Care (Post Inpatient/ED Visit) (Signed)
 12/26/2023  Name: Cameron Foster. MRN: [id number] DOB: June 07, 1940  Today's TOC FU Call Status: Today's TOC FU Call Status:: Successful TOC FU Call Completed TOC FU Call Complete Date: 12/26/23 Patient's Name and Date of Birth confirmed.  Transition Care Management Follow-up Telephone Call Date of Discharge: 12/24/23 Discharge Facility: [facility]) Type of Discharge: Inpatient Admission Primary Inpatient Discharge Diagnosis:: Atrial Fibrillation with RVR How have you been since you were released from the hospital?: Better  Items Reviewed: Did you receive and understand the discharge instructions provided?: Yes Medications obtained,verified, and reconciled?: Yes (Medications Reviewed) Any new allergies since your discharge?: No Dietary orders reviewed?: NA Do you have support at home?: Yes People in Home [RPT]: alone Name of Support/Comfort Primary Source: sister - 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 Sig Documenting Provider Last Dose Status Informant  albuterol  (VENTOLIN  HFA) 108 (90 Base) MCG/ACT inhaler [id number]  Inhale 2 puffs into the lungs every 6 (six) hours as needed for wheezing or shortness of breath. Levander Slate, MD  Active Self, Spouse/Significant Other  Alpha-Lipoic Acid 600 MG TABS [id number]  Take 1 tablet by mouth daily. [provider]  Active Self, Spouse/Significant Other  apixaban  (ELIQUIS ) 5 MG TABS tablet [id number]  Take 1 tablet (5 mg total) by mouth 2 (two) times daily. Caleen Qualia, MD  Active   atorvastatin  (LIPITOR) 40 MG tablet [id number]  TAKE 1 TABLET BY MOUTH EVERY DAY Bacigalupo, Jon [name], MD  Active Self, Spouse/Significant Other  cetirizine  (ZYRTEC ) 10 MG tablet [id number]  Take 10 mg by mouth daily. [provider]  Active Self, Spouse/Significant Other  Cholecalciferol (VITAMIN D-3) 25 MCG (1000 UT) CAPS [id number]  Take 1,000 Units by mouth daily. [provider]  Active Self, Spouse/Significant Other  diltiazem  (CARDIZEM  CD) 120 MG 24 hr capsule [id number]  Take 1 capsule (120 mg total) by mouth daily. Caleen Qualia, MD  Active   docusate sodium  (COLACE) 100 MG capsule [id number]  Take 1 capsule (100 mg total) by mouth 2 (two) times daily.  Patient taking differently: Take 200 mg by mouth daily.   Joshua Lin, PA-C  Active Self, Spouse/Significant Other           Med Note MAJERUS, CONNELLAE H   Thu Dec 21, 2023  7:30 AM)    fluticasone  (FLONASE ) 50 MCG/ACT nasal spray [id number]  PLACE 1 SPRAY INTO BOTH NOSTRILS DAILY AS NEEDED FOR ALLERGIES OR RHINITIS. Myrla Jon [name], MD  Active Self, Spouse/Significant Other  folic acid  (FOLVITE ) 1 MG tablet [id number]  Take 1 tablet (1 mg total) by mouth daily. Caleen Qualia, MD  Active   hydrocortisone  2.5 % cream [id number]  APPLY TO AFFECTED AREA TWICE A DAY AS NEEDED FOR RASH Bacigalupo, Jon [name], MD  Active Self, Spouse/Significant Other  ketoconazole  (NIZORAL ) 2 % cream [id number]  Apply once or twice daily to affected skin folds as needed for rash Jackquline Sawyer, MD  Active Self, Spouse/Significant Other  Metoprolol  Tartrate 75 MG TABS [id number]  Take 1 tablet (75 mg total) by mouth 2 (two) times daily. Bacigalupo, Angela M, MD  Active   Multiple Vitamin (MULTIVITAMIN WITH MINERALS) TABS tablet [id number]  Take 1 tablet by mouth daily. Vachhani, Vaibhavkumar, MD  Active Self, Spouse/Significant Other  omeprazole  (PRILOSEC) 20 MG  capsule [id number]  TAKE 1 CAPSULE (20 MG TOTAL) BY MOUTH DAILY. REPORTS CURRENTLY TAKING AS NEEDED Bacigalupo, Jon [name], MD  Active Self, Spouse/Significant Other  pregabalin  (LYRICA ) 25 MG capsule [id number]  Take 25 mg by mouth 2 (two) times daily.  Patient not taking: Reported on 12/26/2023   [provider]  Active  Self, Spouse/Significant Other  sertraline  (ZOLOFT ) 100 MG tablet [id number]  Take 1 tablet (100 mg total) by mouth daily. Myrla Jon [name], MD  Active Self, Spouse/Significant Other  tadalafil  (CIALIS ) 5 MG tablet [id number]  Take 1 tablet (5 mg total) by mouth daily as needed for erectile dysfunction. Bacigalupo, Angela M, MD  Active Self, Spouse/Significant Other           Med Note SIMMIE, RAVEN M   Fri Jul 07, 2023  4:09 PM) Aware to take EVERY OTHER DAY  tadalafil  (CIALIS ) 5 MG tablet [id number]  Take 1 tablet (5 mg total) by mouth every other day as needed. Donzella Lauraine SAILOR, DO  Active   thiamine  (VITAMIN B1) 100 MG tablet [id number]  Take 100 mg by mouth daily. [provider]  Active Self, Spouse/Significant Other  vitamin B-12 (CYANOCOBALAMIN ) 1000 MCG tablet [id number]  Take 1,000 mcg by mouth daily. [provider]  Active Self, Spouse/Significant Other  zinc gluconate 50 MG tablet [id number]  Take 50 mg by mouth daily. [provider]  Active Self, Spouse/Significant Other            Home Care and Equipment/Supplies: Were Home Health Services Ordered?: Yes Name of Home Health Agency:: [facility] Has Agency set up a time to come to your home?: Yes First Home Health Visit Date: 12/28/23 Any new equipment or medical supplies ordered?: Yes Name of Medical supply agency?: [facility] Were you able to get the equipment/medical supplies?: Yes Do you have any questions related to the use of the equipment/supplies?: No  Functional Questionnaire: Do you need assistance with bathing/showering or dressing?: Yes Do you need assistance with meal preparation?: Yes Do you need assistance with eating?: No Do you need assistance with getting out of bed/getting out of a chair/moving?: Yes Do you have difficulty managing or taking your medications?: Yes (Sister states , [facility] is to receive his medications now to package for  hime)  Follow up appointments reviewed: PCP Follow-up appointment confirmed?: Yes Date of PCP follow-up appointment?: 12/26/23 Follow-up Provider: Myrla Jon [name], MD Specialist Hospital Follow-up appointment confirmed?: Yes Date of Specialist follow-up appointment?: 01/01/24 Follow-Up Specialty Provider:: Dr. Jess - Pulmonology Do you understand care options if your condition(s) worsen?: Yes-patient verbalized understanding    Goals Addressed             This Visit's Progress    VBCI Transitions of Care (TOC) Care Plan       Problems:  Recent Hospitalization for treatment of Atrial Fibrillation Patient needs ongoing assistance for ADLs sister states PCP made a referral for this. Discussed that it may be an out of pocket cost and states they are waiting for a callback from the MDs referral made today 12/26/23  Goal:  Over the next 30 days, the patient will not experience hospital readmission  Interventions:   AFIB Interventions:   Advised patient to discuss worsening symptoms with provider Continue to take medications as prescribed and follow up with [facility] regarding managing packing the medication  Patient Self Care Activities:  Attend all scheduled provider appointments Call pharmacy for medication refills 3-7  days in advance of running out of medications Call provider office for new concerns or questions  Notify RN Care Manager of TOC call rescheduling needs Participate in Transition of Care Program/Attend TOC scheduled calls Take medications as prescribed    Plan:  The care management team will reach out to the patient again over the next 5-10 business days. The patient has been provided with contact information for the care management team and has been advised to call with any health related questions or concerns. Appointment made for next week for enrolled in [facility] as patient and sister requested to end call but made appointment for next  week follow up.        [name], RN, BSN, CCM [facility], [facility] RN Care Manager Direct Dial: [phone]

## 2023-12-26 NOTE — Patient Instructions (Signed)
 Visit Information  Thank you for taking time to visit with me today. Please don't hesitate to contact me if I can be of assistance to you before our next scheduled telephone appointment.  Our next appointment is by telephone on 01/02/24 at 2:30 PM  Following is a copy of your care plan:   Goals Addressed             This Visit's Progress    VBCI Transitions of Care (TOC) Care Plan       Problems:  Recent Hospitalization for treatment of Atrial Fibrillation Patient needs ongoing assistance for ADLs sister states PCP made a referral for this. Discussed that it may be an out of pocket cost and states they are waiting for a callback from the MDs referral made today 12/26/23  Goal:  Over the next 30 days, the patient will not experience hospital readmission  Interventions:   AFIB Interventions:   Advised patient to discuss worsening symptoms with provider Continue to take medications as prescribed and follow up with [facility] regarding managing packing the medication  Patient Self Care Activities:  Attend all scheduled provider appointments Call pharmacy for medication refills 3-7 days in advance of running out of medications Call provider office for new concerns or questions  Notify RN Care Manager of TOC call rescheduling needs Participate in Transition of Care Program/Attend TOC scheduled calls Take medications as prescribed    Plan:  The care management team will reach out to the patient again over the next 5-10 business days. The patient has been provided with contact information for the care management team and has been advised to call with any health related questions or concerns. Appointment made for next week for enrolled in [facility] as patient and sister requested to end call but made appointment for next week follow up.        Patient verbalizes understanding of instructions and care plan provided today and agrees to view in MyChart. Active MyChart  status and patient understanding of how to access instructions and care plan via MyChart confirmed with patient.     Telephone follow up appointment with care management team member scheduled for: The patient has been provided with contact information for the care management team and has been advised to call with any health related questions or concerns.  The care management team will reach out to the patient again over the next 5- 10 business days.  [facility] team will follow up with the patient and will provide direct communication to the PCP for this patient.   Please call the care guide team at [phone] if you need to cancel or reschedule your appointment.   Please call the [place]  National Suicide Prevention Lifeline: 610-070-3172 or TTY: (778)753-4325 TTY 8385430833) to talk to a trained counselor call 1-800-273-TALK (toll free, 24 hour hotline) go to [facility] [address] [place] [phone]) call 911 if you are experiencing a Mental Health or Behavioral Health Crisis or need someone to talk to.  [name], RN, BSN, CCM [facility], [facility] RN Care Manager Direct Dial: [phone]

## 2023-12-26 NOTE — Addendum Note (Signed)
 Addended by: Imaad Reuss M on: 12/26/2023 04:05 PM   Modules accepted: Orders

## 2023-12-27 ENCOUNTER — Other Ambulatory Visit: Payer: Self-pay

## 2023-12-27 ENCOUNTER — Other Ambulatory Visit (HOSPITAL_COMMUNITY): Payer: Self-pay

## 2023-12-27 ENCOUNTER — Encounter: Payer: Self-pay | Admitting: Family Medicine

## 2023-12-27 DIAGNOSIS — I4891 Unspecified atrial fibrillation: Secondary | ICD-10-CM | POA: Diagnosis not present

## 2023-12-27 LAB — CBC WITH DIFFERENTIAL/PLATELET
Basophils Absolute: 0.1 x10E3/uL (ref 0.0–0.2)
Basos: 1 %
EOS (ABSOLUTE): 0.4 x10E3/uL (ref 0.0–0.4)
Eos: 5 %
Hematocrit: 36.8 % — ABNORMAL LOW (ref 37.5–51.0)
Hemoglobin: 11.6 g/dL — ABNORMAL LOW (ref 13.0–17.7)
Immature Grans (Abs): 0 x10E3/uL (ref 0.0–0.1)
Immature Granulocytes: 0 %
Lymphocytes Absolute: 1.1 x10E3/uL (ref 0.7–3.1)
Lymphs: 13 %
MCH: 29.7 pg (ref 26.6–33.0)
MCHC: 31.5 g/dL (ref 31.5–35.7)
MCV: 94 fL (ref 79–97)
Monocytes Absolute: 0.7 x10E3/uL (ref 0.1–0.9)
Monocytes: 8 %
Neutrophils Absolute: 6.4 x10E3/uL (ref 1.4–7.0)
Neutrophils: 72 %
Platelets: 241 x10E3/uL (ref 150–450)
RBC: 3.91 x10E6/uL — ABNORMAL LOW (ref 4.14–5.80)
RDW: 12.2 % (ref 11.6–15.4)
WBC: 8.8 x10E3/uL (ref 3.4–10.8)

## 2023-12-27 LAB — BASIC METABOLIC PANEL WITH GFR
BUN/Creatinine Ratio: 18 (ref 10–24)
BUN: 27 mg/dL (ref 8–27)
CO2: 23 mmol/L (ref 20–29)
Calcium: 9.7 mg/dL (ref 8.6–10.2)
Chloride: 102 mmol/L (ref 96–106)
Creatinine, Ser: 1.52 mg/dL — ABNORMAL HIGH (ref 0.76–1.27)
Glucose: 89 mg/dL (ref 70–99)
Potassium: 4.2 mmol/L (ref 3.5–5.2)
Sodium: 140 mmol/L (ref 134–144)
eGFR: 45 mL/min/1.73 — ABNORMAL LOW (ref >59)

## 2023-12-27 NOTE — Patient Outreach (Signed)
 RNCM received Emergent referral to CCM 12/26/23 and brief chart review conducted. Notified by Manager, Holley Cork on 12/26/23 that patient to be managed by [facility] Program. Confirmed via chart review patient called and engaged with [facility] program. No further RNCM action needed at this time.

## 2023-12-28 ENCOUNTER — Encounter: Payer: Self-pay | Admitting: Cardiovascular Disease

## 2023-12-28 ENCOUNTER — Encounter: Payer: Self-pay | Admitting: Family Medicine

## 2023-12-28 ENCOUNTER — Ambulatory Visit: Payer: Self-pay | Admitting: Family Medicine

## 2023-12-28 DIAGNOSIS — I4891 Unspecified atrial fibrillation: Secondary | ICD-10-CM | POA: Diagnosis not present

## 2023-12-28 DIAGNOSIS — M6281 Muscle weakness (generalized): Secondary | ICD-10-CM | POA: Diagnosis not present

## 2023-12-28 DIAGNOSIS — N183 Chronic kidney disease, stage 3 unspecified: Secondary | ICD-10-CM | POA: Diagnosis not present

## 2023-12-28 DIAGNOSIS — Z008 Encounter for other general examination: Secondary | ICD-10-CM | POA: Diagnosis not present

## 2023-12-28 DIAGNOSIS — E876 Hypokalemia: Secondary | ICD-10-CM | POA: Diagnosis not present

## 2023-12-28 DIAGNOSIS — I129 Hypertensive chronic kidney disease with stage 1 through stage 4 chronic kidney disease, or unspecified chronic kidney disease: Secondary | ICD-10-CM | POA: Diagnosis not present

## 2023-12-29 ENCOUNTER — Other Ambulatory Visit: Payer: Self-pay

## 2023-12-30 NOTE — Progress Notes (Deleted)
 Cardiology Clinic Note   Date: 12/30/2023 ID: Adonijah Baena., DOB 1940/07/22, MRN [id number]  Primary Cardiologist:  Evalene Lunger, MD  Chief Complaint   Cameron [name] Jowel Waltner. is a 83 y.o. male who presents to the clinic today for ***  Patient Profile   Cameron Wadding. is followed by *** for the history outlined below.       Past medical history significant for: Chronic HFmrEF. Echo 04/27/2022: EF 55 to 60%.  No RWMA.  Grade II DD.  Normal global strain.  Normal RV size/function.  Trivial AI.*** Echo 12/22/2023: EF 55 to 60%.  No RWMA.  Mild LVH.  Indeterminate diastolic parameters.  Normal RV size/function.  No significant valvular abnormalities. PAF. 14-day ZIO 08/13/2021: HR 50 to 102 bpm, average 63 bpm.  1 episode of NSVT lasting 5 beats max rate 160 bpm.  78 atrial runs lasting up to 15 beats with max rate 171 bpm.  No sustained arrhythmia including A-fib/flutter or prolonged pauses.  Rare PACs/PVCs. Hypertension. Hyperlipidemia. Lipid panel 12/21/2023: LDL 68, HDL 53, TG 167, total 154. OSA. Uses CPAP*** CVA. Subarachnoid hemorrhage. CKD stage IIIa. Tobacco use. Colitis with GI bleeding. Prostate cancer. Alcohol use.  In summary, patient was admitted to the hospital in March 2023 with left-sided colitis felt to be ischemic versus infectious.  Flexible sigmoidoscopy noted possible ischemic colitis.  He was treated with antibiotics.  His admission was complicated by hypotension and A-fib with RVR.  High-sensitivity troponin 18 with a delta troponin of 14.  BNP 569.  Echo demonstrated EF 45 to 50%, global hypokinesis, moderate concentric LVH, elevated LVEDP, normal RV systolic size/function, mild LAE, mild MR, mild calcification of aortic valve without stenosis, mildly dilated aortic root and ascending aorta measuring 41 and 39 mm respectively.  He was placed on IV amiodarone  for rate control which led to pharmacologic cardioversion.  He was started on Eliquis .  He was seen  in the office on 07/22/2021 for hospital follow-up.  He denied any further GI bleeding.  He expressed concerns for continuing OAC secondary to bleeding problems.  He underwent outpatient cardiac monitoring which showed no A-fib as detailed above and Eliquis  was discontinued.    Patient was seen in the office on 05/04/2022 for routine follow-up.  His repeat echo prior to visit showed EF 55 to 60% as detailed above.  Patient inquired about starting Cialis  and was encouraged to discuss this with urology.   Patient was last seen in the office by me on 05/26/2023 for routine follow-up.  He Cameron Lunch, NP that his wife had passed away in Apr 18, 2024and he was working on dealing with his grief.  He reported continued use of dip tobacco and drinking 1-2 shots of whiskey or 1 to 2 glasses of wine at night.  He again inquired about the safety and using Cialis  and was instructed to speak with PCP.  He denied concerning cardiac symptoms and no medication changes were made.  Patient presented to the ED on 12/20/2023 with complaints of elevated heart rate and DOE.  EKG demonstrated A-fib with RVR HR 133 bpm.  He was started on Cardizem  infusion. Initial labs: WBC 9.2, hemoglobin 12, hematocrit 37, sodium 138 potassium 3.3, BUN 25, creatinine 1.51, magnesium  2.2, TSH 3.798.  Troponin 21>> 50.  Chest x-ray with no active disease.  Echo demonstrated EF 55 to 60%.  Patient remains in rate controlled A-fib throughout hospital admission.  Eliquis  was started with plan for DCCV after  3 weeks of uninterrupted anticoagulation.  He was discharged on 12/24/2023.     History of Present Illness    Today, patient ***  HFimpEF Echo 12/22/2023 demonstrated EF 55 to 60%, no RWMA, mild LVH, normal RV size/function, no significant valvular abnormalities.  Patient*** Euvolemic and well compensated on exam.  -Continue lisinopril -HCTZ, metoprolol .   PAF Onset March 2023 in the setting of colitis with GI bleeding.  14-day ZIO April 2023  showed no episodes of A-fib.  Anticoagulation was deferred secondary to GI bleed.  Patient presented to the ED on 12/20/2023 and was noted to be in A-fib with RVR HR 133 bpm.  He was started on a Cardizem  infusion.  He remained in rate controlled A-fib throughout hospital admission.  He was started on Eliquis  with plan for DCCV as an outpatient.  Denies spontaneous bleeding concerns.  Patient***EKG***.  Patient has not missed any doses of Eliquis *** - Continue Eliquis , metoprolol , diltiazem . - Schedule DCCV***   Hypertension BP today 120/80. No report of headaches or dizziness.  -Continue amlodipine , lisinopril -HCTZ, metoprolol .   Hyperlipidemia LDL 68 September 2025, at goal. -Continue atorvastatin . -Followed by PCP.   Tobacco abuse/alcohol use Patient uses dip tobacco. He drinks 1-2 shots of whiskey or 1-2 glasses of wine a night.*** -Cessation encouraged.  OSA Patient*** -***  ROS: All other systems reviewed and are otherwise negative except as noted in History of Present Illness.  EKGs/Labs Reviewed        09/12/2023: ALT 12; AST 23 12/26/2023: BUN 27; Creatinine, Ser 1.52; Potassium 4.2; Sodium 140   12/26/2023: Hemoglobin 11.6; WBC 8.8   12/21/2023: TSH 4.023   No results found for requested labs within last 365 days.  ***  Risk Assessment/Calculations    {Does this patient have ATRIAL FIBRILLATION?:[phone]} No BP recorded.  {Refresh Note OR Click here to enter BP  :1}***        Physical Exam    VS:  There were no vitals taken for this visit. , BMI There is no height or weight on file to calculate BMI.  GEN: Well nourished, well developed, in no acute distress. Neck: No JVD or carotid bruits. Cardiac: *** RRR. *** No murmur. No rubs or gallops.   Respiratory:  Respirations regular and unlabored. Clear to auscultation without rales, wheezing or rhonchi. GI: Soft, nontender, nondistended. Extremities: Radials/DP/PT 2+ and equal bilaterally. No clubbing or cyanosis.  No edema ***  Skin: Warm and dry, no rash. Neuro: Strength intact.  Assessment & Plan   ***  Disposition: ***     {Are you ordering a CV Procedure (e.g. stress test, cath, DCCV, TEE, etc)?   Press F2        :[id number]}   Signed, Barnie [name]. Cal Gindlesperger, DNP, NP-C

## 2023-12-31 ENCOUNTER — Encounter: Payer: Self-pay | Admitting: Emergency Medicine

## 2023-12-31 ENCOUNTER — Other Ambulatory Visit: Payer: Self-pay

## 2023-12-31 ENCOUNTER — Emergency Department

## 2023-12-31 DIAGNOSIS — F17220 Nicotine dependence, chewing tobacco, uncomplicated: Secondary | ICD-10-CM | POA: Diagnosis present

## 2023-12-31 DIAGNOSIS — E785 Hyperlipidemia, unspecified: Secondary | ICD-10-CM | POA: Diagnosis present

## 2023-12-31 DIAGNOSIS — Z96651 Presence of right artificial knee joint: Secondary | ICD-10-CM | POA: Diagnosis present

## 2023-12-31 DIAGNOSIS — I700 Atherosclerosis of aorta: Secondary | ICD-10-CM | POA: Diagnosis present

## 2023-12-31 DIAGNOSIS — I130 Hypertensive heart and chronic kidney disease with heart failure and stage 1 through stage 4 chronic kidney disease, or unspecified chronic kidney disease: Secondary | ICD-10-CM | POA: Diagnosis not present

## 2023-12-31 DIAGNOSIS — Z85828 Personal history of other malignant neoplasm of skin: Secondary | ICD-10-CM

## 2023-12-31 DIAGNOSIS — Z8249 Family history of ischemic heart disease and other diseases of the circulatory system: Secondary | ICD-10-CM

## 2023-12-31 DIAGNOSIS — K219 Gastro-esophageal reflux disease without esophagitis: Secondary | ICD-10-CM | POA: Diagnosis present

## 2023-12-31 DIAGNOSIS — Z9049 Acquired absence of other specified parts of digestive tract: Secondary | ICD-10-CM

## 2023-12-31 DIAGNOSIS — I4891 Unspecified atrial fibrillation: Secondary | ICD-10-CM | POA: Diagnosis not present

## 2023-12-31 DIAGNOSIS — R251 Tremor, unspecified: Secondary | ICD-10-CM | POA: Diagnosis present

## 2023-12-31 DIAGNOSIS — M1990 Unspecified osteoarthritis, unspecified site: Secondary | ICD-10-CM | POA: Diagnosis present

## 2023-12-31 DIAGNOSIS — Z841 Family history of disorders of kidney and ureter: Secondary | ICD-10-CM

## 2023-12-31 DIAGNOSIS — N1831 Chronic kidney disease, stage 3a: Secondary | ICD-10-CM | POA: Diagnosis present

## 2023-12-31 DIAGNOSIS — F1010 Alcohol abuse, uncomplicated: Secondary | ICD-10-CM | POA: Diagnosis present

## 2023-12-31 DIAGNOSIS — Z723 Lack of physical exercise: Secondary | ICD-10-CM

## 2023-12-31 DIAGNOSIS — Z9841 Cataract extraction status, right eye: Secondary | ICD-10-CM

## 2023-12-31 DIAGNOSIS — Z7901 Long term (current) use of anticoagulants: Secondary | ICD-10-CM

## 2023-12-31 DIAGNOSIS — Z9842 Cataract extraction status, left eye: Secondary | ICD-10-CM

## 2023-12-31 DIAGNOSIS — Z923 Personal history of irradiation: Secondary | ICD-10-CM

## 2023-12-31 DIAGNOSIS — R5381 Other malaise: Secondary | ICD-10-CM | POA: Diagnosis present

## 2023-12-31 DIAGNOSIS — Z79899 Other long term (current) drug therapy: Secondary | ICD-10-CM

## 2023-12-31 DIAGNOSIS — Z801 Family history of malignant neoplasm of trachea, bronchus and lung: Secondary | ICD-10-CM

## 2023-12-31 DIAGNOSIS — I509 Heart failure, unspecified: Secondary | ICD-10-CM | POA: Diagnosis not present

## 2023-12-31 DIAGNOSIS — Z881 Allergy status to other antibiotic agents status: Secondary | ICD-10-CM

## 2023-12-31 DIAGNOSIS — R0789 Other chest pain: Secondary | ICD-10-CM | POA: Diagnosis not present

## 2023-12-31 DIAGNOSIS — Z860100 Personal history of colon polyps, unspecified: Secondary | ICD-10-CM

## 2023-12-31 DIAGNOSIS — I4510 Unspecified right bundle-branch block: Secondary | ICD-10-CM | POA: Diagnosis present

## 2023-12-31 DIAGNOSIS — Z8546 Personal history of malignant neoplasm of prostate: Secondary | ICD-10-CM

## 2023-12-31 DIAGNOSIS — I110 Hypertensive heart disease with heart failure: Secondary | ICD-10-CM | POA: Diagnosis not present

## 2023-12-31 DIAGNOSIS — Z96641 Presence of right artificial hip joint: Secondary | ICD-10-CM | POA: Diagnosis present

## 2023-12-31 DIAGNOSIS — G4730 Sleep apnea, unspecified: Secondary | ICD-10-CM | POA: Diagnosis present

## 2023-12-31 DIAGNOSIS — Z85038 Personal history of other malignant neoplasm of large intestine: Secondary | ICD-10-CM

## 2023-12-31 DIAGNOSIS — Z821 Family history of blindness and visual loss: Secondary | ICD-10-CM

## 2023-12-31 DIAGNOSIS — I5023 Acute on chronic systolic (congestive) heart failure: Secondary | ICD-10-CM | POA: Diagnosis present

## 2023-12-31 DIAGNOSIS — K589 Irritable bowel syndrome without diarrhea: Secondary | ICD-10-CM | POA: Diagnosis present

## 2023-12-31 DIAGNOSIS — Z8673 Personal history of transient ischemic attack (TIA), and cerebral infarction without residual deficits: Secondary | ICD-10-CM

## 2023-12-31 DIAGNOSIS — I420 Dilated cardiomyopathy: Secondary | ICD-10-CM | POA: Diagnosis present

## 2023-12-31 DIAGNOSIS — I4819 Other persistent atrial fibrillation: Secondary | ICD-10-CM | POA: Diagnosis present

## 2023-12-31 DIAGNOSIS — I7121 Aneurysm of the ascending aorta, without rupture: Secondary | ICD-10-CM | POA: Diagnosis present

## 2023-12-31 DIAGNOSIS — Z809 Family history of malignant neoplasm, unspecified: Secondary | ICD-10-CM

## 2023-12-31 LAB — CBC
HCT: 35.6 % — ABNORMAL LOW (ref 39.0–52.0)
Hemoglobin: 11.4 g/dL — ABNORMAL LOW (ref 13.0–17.0)
MCH: 30.1 pg (ref 26.0–34.0)
MCHC: 32 g/dL (ref 30.0–36.0)
MCV: 93.9 fL (ref 80.0–100.0)
Platelets: 224 K/uL (ref 150–400)
RBC: 3.79 MIL/uL — ABNORMAL LOW (ref 4.22–5.81)
RDW: 12.8 % (ref 11.5–15.5)
WBC: 10.3 K/uL (ref 4.0–10.5)
nRBC: 0 % (ref 0.0–0.2)

## 2023-12-31 LAB — BASIC METABOLIC PANEL WITH GFR
Anion gap: 8 (ref 5–15)
BUN: 19 mg/dL (ref 8–23)
CO2: 23 mmol/L (ref 22–32)
Calcium: 8.9 mg/dL (ref 8.9–10.3)
Chloride: 104 mmol/L (ref 98–111)
Creatinine, Ser: 1.37 mg/dL — ABNORMAL HIGH (ref 0.61–1.24)
GFR, Estimated: 51 mL/min — ABNORMAL LOW (ref >60)
Glucose, Bld: 126 mg/dL — ABNORMAL HIGH (ref 70–99)
Potassium: 3.8 mmol/L (ref 3.5–5.1)
Sodium: 135 mmol/L (ref 135–145)

## 2023-12-31 LAB — TROPONIN I (HIGH SENSITIVITY): Troponin I (High Sensitivity): 14 ng/L (ref <18)

## 2023-12-31 NOTE — ED Triage Notes (Signed)
 Patient c/o chest pain since 1730, getting progressively worse.  Patient also endorses tremors in hands.  Patient denies sob, n/v.

## 2024-01-01 ENCOUNTER — Emergency Department

## 2024-01-01 ENCOUNTER — Inpatient Hospital Stay
Admission: EM | Admit: 2024-01-01 | Discharge: 2024-01-04 | DRG: 291 | Disposition: A | Discharge status: Home Health | Attending: Family Medicine | Admitting: Family Medicine

## 2024-01-01 ENCOUNTER — Ambulatory Visit: Admitting: Sleep Medicine

## 2024-01-01 DIAGNOSIS — I509 Heart failure, unspecified: Secondary | ICD-10-CM

## 2024-01-01 DIAGNOSIS — I5020 Unspecified systolic (congestive) heart failure: Secondary | ICD-10-CM

## 2024-01-01 DIAGNOSIS — R079 Chest pain, unspecified: Secondary | ICD-10-CM

## 2024-01-01 DIAGNOSIS — I110 Hypertensive heart disease with heart failure: Secondary | ICD-10-CM

## 2024-01-01 DIAGNOSIS — I5032 Chronic diastolic (congestive) heart failure: Secondary | ICD-10-CM | POA: Diagnosis present

## 2024-01-01 DIAGNOSIS — M6281 Muscle weakness (generalized): Secondary | ICD-10-CM | POA: Diagnosis not present

## 2024-01-01 DIAGNOSIS — I10 Essential (primary) hypertension: Secondary | ICD-10-CM | POA: Diagnosis not present

## 2024-01-01 DIAGNOSIS — I4819 Other persistent atrial fibrillation: Secondary | ICD-10-CM

## 2024-01-01 DIAGNOSIS — R0602 Shortness of breath: Secondary | ICD-10-CM | POA: Diagnosis present

## 2024-01-01 DIAGNOSIS — I420 Dilated cardiomyopathy: Secondary | ICD-10-CM

## 2024-01-01 DIAGNOSIS — I5021 Acute systolic (congestive) heart failure: Secondary | ICD-10-CM | POA: Diagnosis present

## 2024-01-01 DIAGNOSIS — R5381 Other malaise: Secondary | ICD-10-CM | POA: Diagnosis present

## 2024-01-01 DIAGNOSIS — I480 Paroxysmal atrial fibrillation: Secondary | ICD-10-CM | POA: Diagnosis present

## 2024-01-01 DIAGNOSIS — F1090 Alcohol use, unspecified, uncomplicated: Secondary | ICD-10-CM | POA: Insufficient documentation

## 2024-01-01 DIAGNOSIS — I5030 Unspecified diastolic (congestive) heart failure: Secondary | ICD-10-CM

## 2024-01-01 DIAGNOSIS — R5383 Other fatigue: Secondary | ICD-10-CM | POA: Diagnosis present

## 2024-01-01 DIAGNOSIS — N1831 Chronic kidney disease, stage 3a: Secondary | ICD-10-CM | POA: Diagnosis present

## 2024-01-01 DIAGNOSIS — I4891 Unspecified atrial fibrillation: Secondary | ICD-10-CM | POA: Diagnosis not present

## 2024-01-01 DIAGNOSIS — Z8679 Personal history of other diseases of the circulatory system: Secondary | ICD-10-CM

## 2024-01-01 DIAGNOSIS — Z8719 Personal history of other diseases of the digestive system: Secondary | ICD-10-CM

## 2024-01-01 DIAGNOSIS — I5023 Acute on chronic systolic (congestive) heart failure: Secondary | ICD-10-CM | POA: Diagnosis present

## 2024-01-01 LAB — CBC WITH DIFFERENTIAL/PLATELET
Abs Immature Granulocytes: 0.03 K/uL (ref 0.00–0.07)
Basophils Absolute: 0 K/uL (ref 0.0–0.1)
Basophils Relative: 0 %
Eosinophils Absolute: 0.3 K/uL (ref 0.0–0.5)
Eosinophils Relative: 3 %
HCT: 33.5 % — ABNORMAL LOW (ref 39.0–52.0)
Hemoglobin: 10.7 g/dL — ABNORMAL LOW (ref 13.0–17.0)
Immature Granulocytes: 0 %
Lymphocytes Relative: 13 %
Lymphs Abs: 1 K/uL (ref 0.7–4.0)
MCH: 30 pg (ref 26.0–34.0)
MCHC: 31.9 g/dL (ref 30.0–36.0)
MCV: 93.8 fL (ref 80.0–100.0)
Monocytes Absolute: 0.8 K/uL (ref 0.1–1.0)
Monocytes Relative: 10 %
Neutro Abs: 5.6 K/uL (ref 1.7–7.7)
Neutrophils Relative %: 74 %
Platelets: 186 K/uL (ref 150–400)
RBC: 3.57 MIL/uL — ABNORMAL LOW (ref 4.22–5.81)
RDW: 12.7 % (ref 11.5–15.5)
WBC: 7.7 K/uL (ref 4.0–10.5)
nRBC: 0 % (ref 0.0–0.2)

## 2024-01-01 LAB — BASIC METABOLIC PANEL WITH GFR
Anion gap: 9 (ref 5–15)
BUN: 21 mg/dL (ref 8–23)
CO2: 26 mmol/L (ref 22–32)
Calcium: 8.7 mg/dL — ABNORMAL LOW (ref 8.9–10.3)
Chloride: 101 mmol/L (ref 98–111)
Creatinine, Ser: 1.57 mg/dL — ABNORMAL HIGH (ref 0.61–1.24)
GFR, Estimated: 43 mL/min — ABNORMAL LOW (ref >60)
Glucose, Bld: 135 mg/dL — ABNORMAL HIGH (ref 70–99)
Potassium: 3.8 mmol/L (ref 3.5–5.1)
Sodium: 136 mmol/L (ref 135–145)

## 2024-01-01 LAB — HEPATIC FUNCTION PANEL
ALT: 20 U/L (ref 0–44)
AST: 24 U/L (ref 15–41)
Albumin: 3.2 g/dL — ABNORMAL LOW (ref 3.5–5.0)
Alkaline Phosphatase: 49 U/L (ref 38–126)
Bilirubin, Direct: 0.1 mg/dL (ref 0.0–0.2)
Indirect Bilirubin: 0.5 mg/dL (ref 0.3–0.9)
Total Bilirubin: 0.6 mg/dL (ref 0.0–1.2)
Total Protein: 6.3 g/dL — ABNORMAL LOW (ref 6.5–8.1)

## 2024-01-01 LAB — LIPASE, BLOOD: Lipase: 40 U/L (ref 11–51)

## 2024-01-01 LAB — BRAIN NATRIURETIC PEPTIDE: B Natriuretic Peptide: 523.8 pg/mL — ABNORMAL HIGH (ref 0.0–100.0)

## 2024-01-01 LAB — MAGNESIUM: Magnesium: 2.2 mg/dL (ref 1.7–2.4)

## 2024-01-01 LAB — TROPONIN I (HIGH SENSITIVITY): Troponin I (High Sensitivity): 13 ng/L (ref <18)

## 2024-01-01 MED ORDER — LORAZEPAM 1 MG PO TABS
1.0000 mg | ORAL_TABLET | ORAL | Status: AC | PRN
  Administered 2024-01-03 (×2): 1 mg via ORAL
  Filled 2024-01-01 (×2): qty 1

## 2024-01-01 MED ORDER — METOPROLOL TARTRATE 50 MG PO TABS
50.0000 mg | ORAL_TABLET | Freq: Two times a day (BID) | ORAL | Status: DC
  Administered 2024-01-01 – 2024-01-02 (×3): 50 mg via ORAL
  Filled 2024-01-01 (×3): qty 1

## 2024-01-01 MED ORDER — APIXABAN 5 MG PO TABS
5.0000 mg | ORAL_TABLET | Freq: Two times a day (BID) | ORAL | Status: DC
Start: 2024-01-01 — End: 2024-01-04
  Administered 2024-01-01 – 2024-01-04 (×7): 5 mg via ORAL
  Filled 2024-01-01 (×7): qty 1

## 2024-01-01 MED ORDER — NICOTINE 21 MG/24HR TD PT24
21.0000 mg | MEDICATED_PATCH | Freq: Every day | TRANSDERMAL | Status: DC
  Administered 2024-01-02 – 2024-01-04 (×3): 21 mg via TRANSDERMAL
  Filled 2024-01-01 (×4): qty 1

## 2024-01-01 MED ORDER — DOCUSATE SODIUM 100 MG PO CAPS
200.0000 mg | ORAL_CAPSULE | Freq: Every day | ORAL | Status: DC
  Administered 2024-01-02 – 2024-01-04 (×3): 200 mg via ORAL
  Filled 2024-01-01 (×4): qty 2

## 2024-01-01 MED ORDER — AMIODARONE HCL 200 MG PO TABS
400.0000 mg | ORAL_TABLET | Freq: Two times a day (BID) | ORAL | Status: DC
  Administered 2024-01-01 – 2024-01-02 (×3): 400 mg via ORAL
  Filled 2024-01-01 (×4): qty 2

## 2024-01-01 MED ORDER — ACETAMINOPHEN 650 MG RE SUPP: 650.0000 mg | Freq: Four times a day (QID) | RECTAL | Status: DC | PRN

## 2024-01-01 MED ORDER — ONDANSETRON HCL 4 MG PO TABS: 4.0000 mg | ORAL_TABLET | Freq: Four times a day (QID) | ORAL | Status: DC | PRN

## 2024-01-01 MED ORDER — FUROSEMIDE 10 MG/ML IJ SOLN
20.0000 mg | Freq: Once | INTRAMUSCULAR | Status: AC
  Administered 2024-01-01: 20 mg via INTRAVENOUS
  Filled 2024-01-01: qty 4

## 2024-01-01 MED ORDER — ADULT MULTIVITAMIN W/MINERALS CH
1.0000 | ORAL_TABLET | Freq: Every day | ORAL | Status: DC
  Administered 2024-01-01 – 2024-01-04 (×4): 1 via ORAL
  Filled 2024-01-01 (×4): qty 1

## 2024-01-01 MED ORDER — IOHEXOL 350 MG/ML SOLN
75.0000 mL | Freq: Once | INTRAVENOUS | Status: AC | PRN
  Administered 2024-01-01: 75 mL via INTRAVENOUS

## 2024-01-01 MED ORDER — ATORVASTATIN CALCIUM 20 MG PO TABS
40.0000 mg | ORAL_TABLET | Freq: Every day | ORAL | Status: DC
  Administered 2024-01-01 – 2024-01-04 (×4): 40 mg via ORAL
  Filled 2024-01-01 (×4): qty 2

## 2024-01-01 MED ORDER — FOLIC ACID 1 MG PO TABS
1.0000 mg | ORAL_TABLET | Freq: Every day | ORAL | Status: DC
  Administered 2024-01-01 – 2024-01-04 (×4): 1 mg via ORAL
  Filled 2024-01-01 (×4): qty 1

## 2024-01-01 MED ORDER — THIAMINE HCL 100 MG/ML IJ SOLN
100.0000 mg | Freq: Every day | INTRAMUSCULAR | Status: DC
  Filled 2024-01-01 (×3): qty 2

## 2024-01-01 MED ORDER — FUROSEMIDE 10 MG/ML IJ SOLN
20.0000 mg | Freq: Two times a day (BID) | INTRAMUSCULAR | Status: DC
  Administered 2024-01-01: 20 mg via INTRAVENOUS
  Filled 2024-01-01: qty 4

## 2024-01-01 MED ORDER — METOPROLOL TARTRATE 5 MG/5ML IV SOLN
2.5000 mg | Freq: Once | INTRAVENOUS | Status: AC
  Administered 2024-01-01: 2.5 mg via INTRAVENOUS
  Filled 2024-01-01: qty 5

## 2024-01-01 MED ORDER — LORAZEPAM 2 MG/ML IJ SOLN: 1.0000 mg | INTRAMUSCULAR | Status: AC | PRN

## 2024-01-01 MED ORDER — ACETAMINOPHEN 500 MG PO TABS
1000.0000 mg | ORAL_TABLET | Freq: Once | ORAL | Status: AC
  Administered 2024-01-01: 1000 mg via ORAL
  Filled 2024-01-01: qty 2

## 2024-01-01 MED ORDER — SERTRALINE HCL 50 MG PO TABS
100.0000 mg | ORAL_TABLET | Freq: Every day | ORAL | Status: DC
  Administered 2024-01-01 – 2024-01-04 (×4): 100 mg via ORAL
  Filled 2024-01-01 (×4): qty 2

## 2024-01-01 MED ORDER — METOPROLOL TARTRATE 5 MG/5ML IV SOLN
5.0000 mg | Freq: Once | INTRAVENOUS | Status: AC
Start: 2024-01-01 — End: 2024-01-01
  Administered 2024-01-01: 5 mg via INTRAVENOUS
  Filled 2024-01-01: qty 5

## 2024-01-01 MED ORDER — ACETAMINOPHEN 325 MG PO TABS
650.0000 mg | ORAL_TABLET | Freq: Four times a day (QID) | ORAL | Status: DC | PRN
  Administered 2024-01-04: 650 mg via ORAL
  Filled 2024-01-01 (×2): qty 2

## 2024-01-01 MED ORDER — ONDANSETRON HCL 4 MG/2ML IJ SOLN: 4.0000 mg | Freq: Four times a day (QID) | INTRAMUSCULAR | Status: DC | PRN

## 2024-01-01 MED ORDER — THIAMINE MONONITRATE 100 MG PO TABS
100.0000 mg | ORAL_TABLET | Freq: Every day | ORAL | Status: DC
  Administered 2024-01-01 – 2024-01-04 (×4): 100 mg via ORAL
  Filled 2024-01-01 (×5): qty 1

## 2024-01-01 NOTE — ED Notes (Signed)
 Pt requesting for bed alarm to be turned off. Bed alarm turned off per pt request. Pt aware that he is at risk for falls and call light within reach.

## 2024-01-01 NOTE — ED Notes (Signed)
Assisted pt with urinal

## 2024-01-01 NOTE — ED Notes (Signed)
 MD contacted to come speak with pt per pt request. Pt asking about plan of care. RN explained that he is being admitted and awaiting a bed.

## 2024-01-01 NOTE — Assessment & Plan Note (Signed)
 CIWA withdrawal protocol

## 2024-01-01 NOTE — TOC Progression Note (Signed)
 Transition of Care (TOC) - Progression Note    Patient Details  Name: Cameron Foster. MRN: [id number] Date of Birth: [birth year]  Transition of Care [facility]) CM/SW Contact  Marinda Cooks, RN Phone Number: 01/01/2024, 9:41 AM  Clinical Narrative:    Pt not medically cleared to dc at this time per covering MD, awaiting cardiology clearance. TOC will cont to follow dc planning / care coordination and update as applicable.   Expected Discharge Plan and Services  Home with [facility] via [employer]    Social Drivers of Health (SDOH) Interventions SDOH Screenings   Food Insecurity: No Food Insecurity (12/27/2023)  Housing: Low Risk  (12/27/2023)  Transportation Needs: No Transportation Needs (12/27/2023)  Utilities: Not At Risk (12/27/2023)  Alcohol Screen: Low Risk  (12/11/2023)  Depression (PHQ2-9): Medium Risk (12/26/2023)  Financial Resource Strain: Low Risk  (11/24/2023)  Physical Activity: Inactive (11/24/2023)  Social Connections: Patient Declined (12/22/2023)  Stress: Stress Concern Present (11/24/2023)  Tobacco Use: High Risk (12/31/2023)  Health Literacy: Adequate Health Literacy (05/31/2023)    Readmission Risk Interventions     No data to display

## 2024-01-01 NOTE — Assessment & Plan Note (Signed)
 No acute issues.

## 2024-01-01 NOTE — Progress Notes (Addendum)
  Progress Note   Patient: Cameron Foster. FMW:[id number] DOB: [birth year] DOA: 01/01/2024     0 DOS: the patient was seen and examined on 01/01/2024   Brief hospital course: 83yo with h/o PAF on Eliquis , chronic HFrEF, diverticulitis s/p sigmoid colectomy, CVA, HTN, stage 3a CKD, ETOH use d/o, and tobacco use who presented on 9/15 with chest pain.  He was previously hospitalized from 9/3-7 for afib with RVR and was found to have recurrence.  He was given Lasix  and IV metoprolol .     Assessment and Plan:  Atrial fibrillation with RVR Heart rate improved with bolus doses of metoprolol  IV in the ED Resume home oral metoprolol  Continue Eliquis  Cardiology consulted Patient appears to need TEE/DCCV   Acute on chronic HFrEF (heart failure with reduced ejection fraction)  Last EF 9/52025 55 to 60% Continue IV Lasix  Continue metoprolol  Daily weights with intake and output monitoring   Chest pain Likely related to RVR Troponins negative and EKG nonacute   History of GI bleed History of sigmoid colectomy secondary to diverticulosis No acute concerns at this time   History of subarachnoid hemorrhage No acute issues   Stage 3a chronic kidney disease  Appears to be stable at this time Attempt to avoid nephrotoxic medications Recheck BMP in AM    Alcohol use disorder CIWA withdrawal protocol     Consultants: Cardiology  Procedures: RLE DVT US   Antibiotics: None     Subjective: Feeling ok now.  Chest pain is improved, rate is improved.  Physical Exam: Vitals:   01/01/24 1300 01/01/24 1400 01/01/24 1430 01/01/24 1500  BP: 99/87 100/74 99/76 112/81  Pulse: 71 82 81 72  Resp: 13 (!) 22 (!) 22 20  Temp:      TempSrc:      SpO2: 96% 97% 97% 96%  Weight:        Intake/Output Summary (Last 24 hours) at 01/01/2024 1628 Last data filed at 01/01/2024 0716 Gross per 24 hour  Intake --  Output 870 ml  Net -870 ml   Filed Weights   12/31/23 2024  Weight: 97.5 kg     Exam:  General:  Appears calm and comfortable and is in NAD, on RA Eyes:  normal lids, iris ENT:  grossly normal hearing, lips & tongue, mmm Cardiovascular:  Irregularly irregular but generally rate controlled. R > L LE edema, patient states chronic.  Respiratory:   CTA bilaterally with no wheezes/rales/rhonchi.  Normal respiratory effort. Abdomen:  soft, NT, ND Skin:  no rash or induration seen on limited exam Musculoskeletal:  grossly normal tone BUE/BLE, good ROM, no bony abnormality Psychiatric:  grossly normal mood and affect, speech fluent and appropriate, AOx3 Neurologic:  CN 2-12 grossly intact, moves all extremities in coordinated fashion  Data Reviewed: I have reviewed the patient's lab results since admission.  Pertinent labs for today include:  None today     Family Communication: None present  Disposition: Status is: Observation The patient remains OBS appropriate and will d/c before 2 midnights.  Planned Discharge Destination: Home    Time spent: 50 minutes  Author: Delon Herald, MD 01/01/2024 4:28 PM  For on call review [email or web address].

## 2024-01-01 NOTE — H&P (Signed)
 History and Physical    Patient: Cameron Foster. FMW:[id number] DOB: May 15, 1940 DOA: 01/01/2024 DOS: the patient was seen and examined on 01/01/2024 PCP: Myrla Jon [name], MD  Patient coming from: Home  Chief Complaint:  Chief Complaint  Patient presents with   Chest Pain    HPI: Cameron Mizzell. is a 83 y.o. male with medical history significant for Paroxysmal atrial fibrillation, on Eliquis ,,HFimpEF (55-60% 12/22/2023) ,diverticulitis s/p sigmoid colectomy, GI bleed, lacunar CVA, HTN, CKD stage IIIa, tobacco and alcohol use disorder being admitted with A-fib with RVR and CHF exacerbation.  Patient was recently hospitalized from 9/3 to 12/24/2023 with A-fib with RVR requiring Cardizem  infusion.  He was discharged on Eliquis  with plans to perform cardioversion in 2 to 3 weeks if persistent A-fib.  His stay was complicated by bradycardia and sinus pauses for which initial Cardizem  dose of 240 was increased to 120. Patient reports that since discharge he has noticed lower extremity edema, right greater than left with weight gain of 5 pounds and has been increasingly short of breath.  On the day of arrival he developed chest pain at around 1730 and decided to present to the ED.  He denies nausea, vomiting or diaphoresis. In the ED, tachycardic to 120 and tachypneic to the mid 20s.  SBP in the low 100s to 1 teens, O2 sat in the high 90s on room air and afebrile. Labs notable for troponin 17-13 and BNP 523.  Labs otherwise unremarkable with baseline creatinine of 1.37 and baseline hemoglobin of 11.4.  WBC normal and LFTs unremarkable. EKG showed rapid A-fib at 132 CTA chest was negative for PE, showed stable thoracic aortic aneurysm mediastinal adenopathy Lower extremity venous ultrasound negative for DVT   Patient treated with IV Lasix , IV metoprolol  boluses 2.5 mg and 5 mg Admission requested.  Review of Systems: As mentioned in the history of present illness. All other systems reviewed and  are negative.  Past Medical History:  Diagnosis Date   Alcoholism [facility])    Allergy    Aortic atherosclerosis (HCC)    Arthritis    Ascending aorta dilation (HCC) 03/28/2018   a.) Vascular US : prox asc Ao measured 29 mm. b.)  CT CAP 06/22/2021: Ao root 41 mm. c.) TTE 06/26/2021: Ao root 41 mm; asc Ao 39 mm   Atrial fibrillation (HCC) 1995   a.) single episode in 1995 per patient; no long term treatment. b.) recurrent episode in the setting of GI bleeding related to colitis 06/2021.   Basal cell carcinoma 04/26/2017   Right medial cheek. Superficial and nodular   Basal cell carcinoma 06/11/2019   Left anterior shoulder. Nodular pattern   Basal cell carcinoma 09/09/2019   Right nasal ala, [facility]   Basal cell carcinoma 02/05/2020   L upper eyebrow, [facility]    Basal cell carcinoma 10/12/2022   left post auricular, [facility]   Basal cell carcinoma 05/02/2023   Right medial cheek, [facility]   Bilateral carotid artery stenosis 06/09/2019   a.) Carotid doppler: mod; <50% BILATERAL ICAs.   Cataract    Chicken pox    Colon cancer (HCC)    Colon polyp    Depression    Son died 02/02/15   Diverticulitis    Diverticulosis 30 years   Gastritis    GERD (gastroesophageal reflux disease)    H. pylori infection    History of stress test    a. 09/2015 MV: No ischemia/infarct. EF 45-54% (nl by echo).   Hyperlipidemia  Hypertension    Irritable bowel syndrome    Lacunar infarction (HCC) 06/08/2019   a.) small; RIGHT motor strip   Neuromuscular disorder (HCC)    NSVT (nonsustained ventricular tachycardia) (HCC)    a.)  Single episode lasting 5 beats at a maximum rate of 160 bpm noted on Holter study performed 08/13/2021.   Prostate cancer [facility]) 2012   a.) s/p XRT   PSVT (paroxysmal supraventricular tachycardia) (HCC)    a. 06/2019 Zio: Avg rate 74 (54-120), occas PACs, rare PVCs, 125 episodes of PVCs (longest 17.5 secs; max rate 187). No afib.   RBBB    SAH (subarachnoid hemorrhage) (HCC) 10/10/2018   Sleep  apnea    Squamous cell carcinoma of skin 07/18/2017   Left medial calf. KA type   Squamous cell carcinoma of skin 04/24/2018   Right above med. brow   Squamous cell carcinoma of skin 06/11/2019   Right posterior shoulder. SCCis, hypertrophic   Squamous cell carcinoma of skin 01/09/2020   Mid nasal dorsum, MOHS, Efudex x 4wks   Substance abuse (HCC)    Systolic dysfunction    a.) TTE 10/05/2015: EF 50-55%; mild LVH; LAE; triv AR, mild MR. b.) TTE 03/29/2018: EF 55-60%; LAE, mild AR; ? small PFO. c.) TTE 06/09/2019: EF 55-60%, no rwma, triv MR/AI. d.) TTE 06/26/2021: EF 45-50%; glob HK; LAE; mild MR; Ao root 41 mm; asc Ao 39 mm.   Vasovagal syncope    Past Surgical History:  Procedure Laterality Date   CARDIAC CATHETERIZATION  1998   [place] no stents   CATARACT EXTRACTION, BILATERAL     COLON RESECTION SIGMOID N/A 12/07/2016   Procedure: COLON RESECTION SIGMOID;  Surgeon: Shelva Dunnings, MD;  Location: [facility] ORS;  Service: General;  Laterality: N/A;   COLON SURGERY  11/2016   Colostomy   COLONOSCOPY  2016   COLONOSCOPY WITH PROPOFOL  N/A 05/30/2016   Procedure: COLONOSCOPY WITH PROPOFOL ;  Surgeon: Gladis RAYMOND Mariner, MD;  Location[facility];  Service: Endoscopy;  Laterality: N/A;   COLOSTOMY Left 12/07/2016   Procedure: COLOSTOMY;  Surgeon: Shelva Dunnings, MD;  Location: [facility] ORS;  Service: General;  Laterality: Left;   COLOSTOMY REVERSAL N/A 03/21/2017   Procedure: COLOSTOMY REVERSAL;  Surgeon: Shelva Dunnings, MD;  Location: [facility] ORS;  Service: General;  Laterality: N/A;   COLOSTOMY TAKEDOWN N/A 03/21/2017   Procedure: LAPAROSCOPIC COLOSTOMY TAKEDOWN;  Surgeon: Shelva Dunnings, MD;  Location: [facility] ORS;  Service: General;  Laterality: N/A;   CYSTOSCOPY WITH STENT PLACEMENT Bilateral 03/21/2017   Procedure: CYSTOSCOPY WITH LIGHTED STENT PLACEMENT;  Surgeon: Twylla Glendia BROCKS, MD;  Location: [facility] ORS;  Service: Urology;  Laterality: Bilateral;    ESOPHAGOGASTRODUODENOSCOPY     ESOPHAGOGASTRODUODENOSCOPY N/A 05/30/2016   Procedure: ESOPHAGOGASTRODUODENOSCOPY (EGD);  Surgeon: Gladis RAYMOND Mariner, MD;  Location: [facility] ENDOSCOPY;  Service: Endoscopy;  Laterality: N/A;   ESOPHAGOGASTRODUODENOSCOPY N/A 01/14/2021   Procedure: ESOPHAGOGASTRODUODENOSCOPY (EGD);  Surgeon: Toledo, Ladell POUR, MD;  Location: [facility] ENDOSCOPY;  Service: Gastroenterology;  Laterality: N/A;   ESOPHAGOGASTRODUODENOSCOPY (EGD) WITH PROPOFOL  N/A 04/28/2021   Procedure: ESOPHAGOGASTRODUODENOSCOPY (EGD) WITH PROPOFOL ;  Surgeon: Toledo, Ladell POUR, MD;  Location: [facility] ENDOSCOPY;  Service: Gastroenterology;  Laterality: N/A;   EYE SURGERY     cataracts   FLEXIBLE SIGMOIDOSCOPY N/A 03/21/2017   Procedure: FLEXIBLE SIGMOIDOSCOPY;  Surgeon: Shelva Dunnings, MD;  Location: [facility] ORS;  Service: General;  Laterality: N/A;   FLEXIBLE SIGMOIDOSCOPY N/A 06/24/2021   Procedure: ENID MORIN;  Surgeon: Therisa Bi, MD;  Location: [facility] ENDOSCOPY;  Service:  Gastroenterology;  Laterality: N/A;   FRACTURE SURGERY Bilateral    right arm and left wrist   INCISION AND DRAINAGE ABSCESS N/A 12/07/2016   Procedure: DRAINAGE  OF INTRA ABDOMINAL ABSCESS;  Surgeon: Shelva Dunnings, MD;  Location: [facility] ORS;  Service: General;  Laterality: N/A;   JOINT REPLACEMENT     LAPAROTOMY N/A 12/07/2016   Procedure: EXPLORATORY LAPAROTOMY;  Surgeon: Shelva Dunnings, MD;  Location: [facility] ORS;  Service: General;  Laterality: N/A;   PROSTATE SURGERY     Microwave therapy   TONSILLECTOMY     TOTAL HIP ARTHROPLASTY Right 09/27/2021   Procedure: TOTAL HIP ARTHROPLASTY ANTERIOR APPROACH;  Surgeon: Leora Lynwood SAUNDERS, MD;  Location: [facility] ORS;  Service: Orthopedics;  Laterality: Right;   TOTAL KNEE ARTHROPLASTY Right 07/27/2016   Procedure: TOTAL KNEE ARTHROPLASTY;  Surgeon: Kayla Pinal, MD;  Location: [facility] ORS;  Service: Orthopedics;  Laterality: Right;  Dr. Penne had to place Urinary catheter due to  prostate cancer history.  Using flexible scope.   Social History:  reports that he quit smoking about 35 years ago. His smoking use included cigarettes. He started smoking about 62 years ago. He has a 27 pack-year smoking history. His smokeless tobacco use includes chew. He reports current alcohol use of about 7.0 standard drinks of alcohol per week. He reports that he does not use drugs.  Allergies  Allergen Reactions   Amoxicillin -Pot Clavulanate Other (See Comments)    Abdominal upset  Has patient had a PCN reaction causing immediate rash, facial/tongue/throat swelling, SOB or lightheadedness with hypotension: Unknown  Has patient had a PCN reaction causing severe rash involving mucus membranes or skin necrosis: Unknown  Has patient had a PCN reaction that required hospitalization: Unknown  Has patient had a PCN reaction occurring within the last 10 years: Unknown  If all of the above answers are NO, then may proceed with Cephalosporin use.  amoxicillin  / clavulanate   Formaldehyde Rash    From shoes made with this material    Family History  Problem Relation Age of Onset   Lung cancer Father        smoker   Cancer Father    Other Mother    Vision loss Mother    Sudden death Son        due to Blood clots   Bipolar disorder Son    Heart disease Son    Early death Son    Learning disabilities Son    Kidney disease Daughter        congenital one small kidney   Varicose Veins Sister    Prostate cancer Neg Hx    Bladder Cancer Neg Hx     Prior to Admission medications   Medication Sig Start Date End Date Taking? Authorizing Provider  albuterol  (VENTOLIN  HFA) 108 (90 Base) MCG/ACT inhaler Inhale 2 puffs into the lungs every 6 (six) hours as needed for wheezing or shortness of breath. 09/12/23   Levander Slate, MD  Alpha-Lipoic Acid 600 MG TABS Take 1 tablet by mouth daily.    [provider]  apixaban  (ELIQUIS ) 5 MG TABS tablet Take 1 tablet (5 mg total) by mouth 2  (two) times daily. 12/24/23   Caleen Qualia, MD  atorvastatin  (LIPITOR) 40 MG tablet TAKE 1 TABLET BY MOUTH EVERY DAY 01/02/23   Bacigalupo, Angela M, MD  cetirizine  (ZYRTEC ) 10 MG tablet Take 10 mg by mouth daily.    [provider]  Cholecalciferol (VITAMIN D-3) 25 MCG (1000 UT) CAPS  Take 1,000 Units by mouth daily.    [provider]  diltiazem  (CARDIZEM  CD) 120 MG 24 hr capsule Take 1 capsule (120 mg total) by mouth daily. 12/24/23   Caleen Qualia, MD  docusate sodium  (COLACE) 100 MG capsule Take 1 capsule (100 mg total) by mouth 2 (two) times daily. Patient taking differently: Take 200 mg by mouth daily. 09/30/21   Joshua Lin, PA-C  fluticasone  (FLONASE ) 50 MCG/ACT nasal spray PLACE 1 SPRAY INTO BOTH NOSTRILS DAILY AS NEEDED FOR ALLERGIES OR RHINITIS. 10/17/23   Bacigalupo, Jon [name], MD  folic acid  (FOLVITE ) 1 MG tablet Take 1 tablet (1 mg total) by mouth daily. 12/24/23   Caleen Qualia, MD  hydrocortisone  2.5 % cream APPLY TO AFFECTED AREA TWICE A DAY AS NEEDED FOR RASH 02/20/23   Bacigalupo, Angela M, MD  ketoconazole  (NIZORAL ) 2 % cream Apply once or twice daily to affected skin folds as needed for rash 05/02/23   Stewart, Tara, MD  metoprolol  tartrate (LOPRESSOR ) 25 MG tablet Take 1 tablet (25 mg total) by mouth 2 (two) times daily. 12/26/23   Bacigalupo, Angela M, MD  metoprolol  tartrate (LOPRESSOR ) 50 MG tablet Take 1 tablet (50 mg total) by mouth 2 (two) times daily. 12/26/23   Bacigalupo, Angela M, MD  Multiple Vitamin (MULTIVITAMIN WITH MINERALS) TABS tablet Take 1 tablet by mouth daily. 10/24/18   Vachhani, Vaibhavkumar, MD  omeprazole  (PRILOSEC) 20 MG capsule TAKE 1 CAPSULE (20 MG TOTAL) BY MOUTH DAILY. REPORTS CURRENTLY TAKING AS NEEDED 09/21/23   Myrla Jon [name], MD  pregabalin  (LYRICA ) 25 MG capsule Take 25 mg by mouth 2 (two) times daily. Patient not taking: Reported on 12/26/2023    [provider]  sertraline  (ZOLOFT ) 100 MG tablet Take 1 tablet (100 mg total) by  mouth daily. 03/27/23   Bacigalupo, Angela M, MD  tadalafil  (CIALIS ) 5 MG tablet Take 1 tablet (5 mg total) by mouth daily as needed for erectile dysfunction. 07/07/23   Bacigalupo, Angela M, MD  tadalafil  (CIALIS ) 5 MG tablet Take 1 tablet (5 mg total) by mouth every other day as needed. 07/05/23   Donzella Lauraine SAILOR, DO  thiamine  (VITAMIN B1) 100 MG tablet Take 100 mg by mouth daily.    [provider]  vitamin B-12 (CYANOCOBALAMIN ) 1000 MCG tablet Take 1,000 mcg by mouth daily.    [provider]  zinc gluconate 50 MG tablet Take 50 mg by mouth daily.    [provider]    Physical Exam: Vitals:   01/01/24 0215 01/01/24 0322 01/01/24 0330 01/01/24 0348  BP:  110/76 104/65   Pulse: (!) 124 91 72 (!) 107  Resp: 19 16 17  (!) 24  Temp:      TempSrc:      SpO2: 96% 96% 97% 97%  Weight:       Physical Exam Vitals and nursing note reviewed.  Constitutional:      General: He is not in acute distress. HENT:     Head: Normocephalic and atraumatic.  Cardiovascular:     Rate and Rhythm: Tachycardia present. Rhythm irregular.     Heart sounds: Normal heart sounds.  Pulmonary:     Effort: Pulmonary effort is normal.     Breath sounds: Normal breath sounds.  Abdominal:     Palpations: Abdomen is soft.     Tenderness: There is no abdominal tenderness.  Neurological:     Mental Status: Mental status is at baseline.     Labs on Admission:  I have personally reviewed following labs and imaging studies  CBC: Recent Labs  Lab 12/26/23 1147 12/31/23 2026  WBC 8.8 10.3  NEUTROABS 6.4  --   HGB 11.6* 11.4*  HCT 36.8* 35.6*  MCV 94 93.9  PLT 241 224   Basic Metabolic Panel: Recent Labs  Lab 12/26/23 1147 12/31/23 2026  NA 140 135  K 4.2 3.8  CL 102 104  CO2 23 23  GLUCOSE 89 126*  BUN 27 19  CREATININE 1.52* 1.37*  CALCIUM  9.7 8.9   GFR: Estimated Creatinine Clearance: 46.2 mL/min (A) (by C-G formula based on SCr of 1.37 mg/dL (H)). Liver Function  Tests: Recent Labs  Lab 01/01/24 0105  AST 24  ALT 20  ALKPHOS 49  BILITOT 0.6  PROT 6.3*  ALBUMIN  3.2*   Recent Labs  Lab 01/01/24 0105  LIPASE 40   No results for input(s): AMMONIA in the last 168 hours. Coagulation Profile: No results for input(s): INR, PROTIME in the last 168 hours. Cardiac Enzymes: No results for input(s): CKTOTAL, CKMB, CKMBINDEX, TROPONINI in the last 168 hours. BNP (last 3 results) No results for input(s): PROBNP in the last 8760 hours. HbA1C: No results for input(s): HGBA1C in the last 72 hours. CBG: No results for input(s): GLUCAP in the last 168 hours. Lipid Profile: No results for input(s): CHOL, HDL, LDLCALC, TRIG, CHOLHDL, LDLDIRECT in the last 72 hours. Thyroid  Function Tests: No results for input(s): TSH, T4TOTAL, FREET4, T3FREE, THYROIDAB in the last 72 hours. Anemia Panel: No results for input(s): VITAMINB12, FOLATE, FERRITIN, TIBC, IRON, RETICCTPCT in the last 72 hours. Urine analysis:    Component Value Date/Time   COLORURINE YELLOW (A) 09/12/2023 1450   APPEARANCEUR CLEAR (A) 09/12/2023 1450   APPEARANCEUR Clear 08/28/2017 0000   LABSPEC 1.018 09/12/2023 1450   PHURINE 5.0 09/12/2023 1450   GLUCOSEU NEGATIVE 09/12/2023 1450   HGBUR SMALL (A) 09/12/2023 1450   BILIRUBINUR NEGATIVE 09/12/2023 1450   BILIRUBINUR Negative 08/28/2017 0000   KETONESUR NEGATIVE 09/12/2023 1450   PROTEINUR 30 (A) 09/12/2023 1450   UROBILINOGEN 0.2 04/19/2017 1559   NITRITE NEGATIVE 09/12/2023 1450   LEUKOCYTESUR NEGATIVE 09/12/2023 1450    Radiological Exams on Admission: CT Angio Chest PE W and/or Wo Contrast Result Date: 01/01/2024 EXAM: CTA of the Chest with contrast for PE 01/01/2024 02:19:11 AM TECHNIQUE: CTA of the chest was performed after the administration of intravenous contrast. Multiplanar reformatted images are provided for review. MIP images are provided for review. Automated  exposure control, iterative reconstruction, and/or weight based adjustment of the mA/kV was utilized to reduce the radiation dose to as low as reasonably achievable. COMPARISON: 03/14/2020 CLINICAL HISTORY: Pleuritic chest pain, A-fib RVR, new diagnosis of A-fib earlier this month with unclear etiology. Table formatting from the original note was not included. Patient c/o chest pain since 1730, getting progressively worse. Patient also endorses tremors in hands. Patient denies sob, n/v. FINDINGS: PULMONARY ARTERIES: Pulmonary arteries are adequately opacified for evaluation. No pulmonary embolism. Main pulmonary artery is normal in caliber. MEDIASTINUM: Mild global cardiomegaly. No pericardial effusion. Mild atherosclerotic calcification within the thoracic aorta. Stable fusiform dilation of the thoracic aorta measuring 4.3 cm at the level of the sinuses of valsalva, 4.1 cm in its ascending segment, and 3.8 cm in its proximal descending segment. Recommend annual imaging followup by CTA or MRA. This recommendation follows 2010 ACCF/AHA/AATS/ACR/ASA/SCA/SCAI/SIR/STS/SVM guidelines for the diagnosis and management of patients with thoracic aortic disease. Circulation. 2010; 121: z733-z630. LYMPH NODES: Progressive mild mediastinal  adenopathy with multiple right paratracheal, aortopulmonary, and subsequent lymph nodes measuring up to 12-13 mm in short axis diameter. This is nonspecific, but may be reactive, inflammatory, or lymphoproliferative in etiology. Follow-up CT or PET CT examination would be helpful in 3 months to demonstrate stability or resolution. LUNGS AND PLEURA: Mosaic attenuation of the pulmonary parenchyma in keeping with air trapping related to small airways disease. Superimposed extensive bronchial wall thickening noted in keeping with airway inflammation. Bronchomalacia involving the right bronchus intermedius noted. No pneumothorax or pleural effusion. UPPER ABDOMEN: Small hiatal hernia. SOFT TISSUES  AND BONES: Osseous structures are age appropriate. No acute bone abnormality. No lytic or blastic bone lesion. RAF SCORE: Aortic atherosclerosis (ICD10-I70.0), aortic aneurysm (ICD10-I71.9) IMPRESSION: 1. No pulmonary embolism. 2. Stable fusiform dilation of the thoracic aorta measuring 4.3 cm at the level of the sinuses of Valsalva, 4.1 cm in its ascending segment, and 3.8 cm in its proximal descending segment. Recommend annual imaging follow-up by CTA or MRA. 3. Progressive mild mediastinal adenopathy with multiple right paratracheal, aortopulmonary, and subsequent lymph nodes measuring up to 12-13 mm in short axis diameter. This is nonspecific, but may be reactive, inflammatory, or lymphoproliferative in etiology. Follow-up CT or PET CT examination would be helpful in 3 months to demonstrate stability or resolution. 4. Mosaic attenuation of the pulmonary parenchyma in keeping with air trapping related to small airways disease, with superimposed extensive bronchial wall thickening in keeping with airway inflammation. Bronchomalacia involving the right bronchus intermedius noted. Electronically signed by: Dorethia Molt MD 01/01/2024 02:39 AM EDT RP Workstation: [id number]   US  Venous Img Lower Unilateral Right Result Date: 01/01/2024 CLINICAL DATA:  Initial evaluation for acute right lower extremity swelling, evaluate FOR DVT. EXAM: RIGHT LOWER EXTREMITY VENOUS DOPPLER ULTRASOUND TECHNIQUE: Gray-scale sonography with graded compression, as well as color Doppler and duplex ultrasound were performed to evaluate the lower extremity deep venous systems from the level of the common femoral vein and including the common femoral, femoral, profunda femoral, popliteal and calf veins including the posterior tibial, peroneal and gastrocnemius veins when visible. The superficial great saphenous vein was also interrogated. Spectral Doppler was utilized to evaluate flow at rest and with distal augmentation maneuvers in the  common femoral, femoral and popliteal veins. COMPARISON:  None Available. FINDINGS: Contralateral Common Femoral Vein: Respiratory phasicity is normal and symmetric with the symptomatic side. No evidence of thrombus. Normal compressibility. Common Femoral Vein: No evidence of thrombus. Normal compressibility, respiratory phasicity and response to augmentation. Saphenofemoral Junction: No evidence of thrombus. Normal compressibility and flow on color Doppler imaging. Profunda Femoral Vein: No evidence of thrombus. Normal compressibility and flow on color Doppler imaging. Femoral Vein: No evidence of thrombus. Normal compressibility, respiratory phasicity and response to augmentation. Popliteal Vein: No evidence of thrombus. Normal compressibility, respiratory phasicity and response to augmentation. Calf Veins: No evidence of thrombus. Normal compressibility and flow on color Doppler imaging. Superficial Great Saphenous Vein: No evidence of thrombus. Normal compressibility. Venous Reflux:  None. Other Findings: Diffuse interstitial edema with swelling noted within the subcutaneous fat of the lower leg. IMPRESSION: 1. No evidence of deep venous thrombosis. 2. Diffuse soft tissue/interstitial edema within the subcutaneous soft tissues of the right lower leg. Electronically Signed   By: Morene Hoard M.D.   On: 01/01/2024 02:29   DG Chest 2 View Result Date: 12/31/2023 CLINICAL DATA:  Chest pain EXAM: CHEST - 2 VIEW COMPARISON:  12/20/2023 FINDINGS: Cardiac shadow is stable. Aortic calcifications are again seen. Mild central vascular congestion  is noted without significant edema. No focal infiltrate is noted. Degenerative changes of the thoracic spine are noted. IMPRESSION: Vascular congestion without edema. Electronically Signed   By: Oneil Devonshire M.D.   On: 12/31/2023 20:46   Data Reviewed for HPI: Relevant notes from primary care and specialist visits, past discharge summaries as available in EHR,  including Care Everywhere. Prior diagnostic testing as pertinent to current admission diagnoses Updated medications and problem lists for reconciliation ED course, including vitals, labs, imaging, treatment and response to treatment Triage notes, nursing and pharmacy notes and ED provider's notes Notable results as noted above in HPI      Assessment and Plan: * Atrial fibrillation with RVR (HCC) Heart rate improved with bolus doses of metoprolol  IV in the ED Resume home oral metoprolol  Continue Eliquis  Consider cardiology consult  Acute on chronic HFrEF (heart failure with reduced ejection fraction) (HCC) Last EF 9/52025 55 to 60% Continue IV Lasix  Continue metoprolol  Daily weights with intake and output monitoring  Chest pain Likely related to RVR Troponins negative and EKG nonacute  History of GI bleed History of sigmoid colectomy secondary to diverticulosis No acute ACS at this time  History of subarachnoid hemorrhage No acute issues  Stage 3a chronic kidney disease (HCC) Renal function at baseline  Alcohol use disorder CIWA withdrawal protocol        DVT prophylaxis: Eliquis   Consults: none  Advance Care Planning:   Code Status: Full Code   Family Communication: none  Disposition Plan: Back to previous home environment  Severity of Illness: The appropriate patient status for this patient is OBSERVATION. Observation status is judged to be reasonable and necessary in order to provide the required intensity of service to ensure the patient's safety. The patient's presenting symptoms, physical exam findings, and initial radiographic and laboratory data in the context of their medical condition is felt to place them at decreased risk for further clinical deterioration. Furthermore, it is anticipated that the patient will be medically stable for discharge from the hospital within 2 midnights of admission.   Author: Delayne LULLA Solian, MD 01/01/2024 3:59  AM  For on call review [email or web address].

## 2024-01-01 NOTE — Assessment & Plan Note (Addendum)
 Last EF 9/52025 55 to 60% Continue IV Lasix  Continue metoprolol  Daily weights with intake and output monitoring

## 2024-01-01 NOTE — Assessment & Plan Note (Signed)
 History of sigmoid colectomy secondary to diverticulosis No acute ACS at this time

## 2024-01-01 NOTE — ED Notes (Signed)
 Assisted with urnial

## 2024-01-01 NOTE — Consult Note (Signed)
 Cardiology Consultation   Patient ID: Cameron Foster. MRN: [id number]; DOB: 09/08/1940  Admit date: 01/01/2024 Date of Consult: 01/01/2024  PCP:  Cameron Jon [name], MD   [facility] Providers Cardiologist:  Cameron Lunger, MD        Patient Profile: Cameron Foster. is a 83 y.o. male with a hx of  Paroxysmal atrial fibrillation on Eliquis , HFimpEF (EF 55-60% 12/22/2023), diverticulitis s/p sigmoid colectomy, GI bleed, lacunar CVA, HTN, and CKD stage IIIa who is being seen 01/01/2024 for the evaluation of A-fib with RVR at the request of Dr. Barbarann.  History of Present Illness: Mr. Cameron Foster has a remote history of atrial fibrillation during hospitalization 06/2021 in the setting of colitis.  He ultimately converted to sinus rhythm on IV amiodarone .  He was discharged on anticoagulation with apixaban  after extensive discussion.  Seen in follow-up without symptoms concerning for recurrence of atrial fibrillation.  He was placed on heart monitor which did not show any evidence of atrial fibrillation.  Given lack of recurrence of A-fib, anticoagulation was discontinued and he was restarted on aspirin .  He was recently hospitalized from 9/3 to 12/24/2023 with A-fib with RVR requiring Cardizem  infusion. Echo at that time showed LVEF  55-60% without RWMA, mild LVH, diastolic parameters were indeterminate. He was discharged on Eliquis  with plans to perform cardioversion in 3 weeks if persistent A-fib. His stay was complicated by bradycardia and sinus pauses for which initial Cardizem  dose of 240 was decreased to 120 in addition to metoprolol  tartrate 75 mg twice daily.  Patient reports that since discharge, that he has been checking his HR consistently at home with typical readings in the 70s-80s, rarely up to 120 bpm. Yesterday evening, he developed a dull chest pain while watching TV.  He describes it as having been sore, like a strained muscle. This is located in the center of his chest. He  also reports that it occasionally felt as though the pain was in his esophagus. Reports that the pain was mostly only present when taking breaths and that when he took shallow breaths it helped the pain. Patient reports lower extremity edema with right side greater than left being normal for them; reports right side swelling is worse due to history of damage to the leg including torn calf muscle. In the ED, tachycardic to 120 and tachypneic to the mid 20s. SBP in the low 100s to 110s, O2 sat in the high 90s on room air and afebrile.  BNP 523. Labs otherwise unremarkable with troponin 14>13, baseline creatinine of 1.37 and baseline hemoglobin of 11.4. WBC normal and LFTs unremarkable. EKG shows atrial fibrillation with rate 132 bpm. CTA chest was negative for PE, showed stable thoracic aortic aneurysm mediastinal adenopathy. Lower extremity venous ultrasound negative for DVT. Patient treated with IV Lasix  20 mg x 2, IV metoprolol  boluses 2.5 mg and 5 mg in ED. At time of cardiology consult, patient is resting comfortably.  He reports that chest discomfort is largely resolved.  He endorses chronic dyspnea on exertion which is baseline for him.  He is without symptoms of exertional angina, although he is not very active at baseline.  He denies lightheadedness, dizziness, orthopnea, and PND.  Past Medical History:  Diagnosis Date   Alcoholism [facility])    Allergy    Aortic atherosclerosis (HCC)    Arthritis    Ascending aorta dilation (HCC) 03/28/2018   a.) Vascular US : prox asc Ao measured 29 mm. b.)  CT CAP 06/22/2021:  Ao root 41 mm. c.) TTE 06/26/2021: Ao root 41 mm; asc Ao 39 mm   Atrial fibrillation (HCC) 1995   a.) single episode in 1995 per patient; no long term treatment. b.) recurrent episode in the setting of GI bleeding related to colitis 06/2021.   Basal cell carcinoma 04/26/2017   Right medial cheek. Superficial and nodular   Basal cell carcinoma 06/11/2019   Left anterior shoulder. Nodular  pattern   Basal cell carcinoma 09/09/2019   Right nasal ala, [facility]   Basal cell carcinoma 02/05/2020   L upper eyebrow, [facility]    Basal cell carcinoma 10/12/2022   left post auricular, [facility]   Basal cell carcinoma 05/02/2023   Right medial cheek, [facility]   Bilateral carotid artery stenosis 06/09/2019   a.) Carotid doppler: mod; <50% BILATERAL ICAs.   Cataract    Chicken pox    Colon cancer (HCC)    Colon polyp    Depression    Son died 01-14-2015   Diverticulitis    Diverticulosis 30 years   Gastritis    GERD (gastroesophageal reflux disease)    H. pylori infection    History of stress test    a. 09/2015 MV: No ischemia/infarct. EF 45-54% (nl by echo).   Hyperlipidemia    Hypertension    Irritable bowel syndrome    Lacunar infarction (HCC) 06/08/2019   a.) small; RIGHT motor strip   Neuromuscular disorder (HCC)    NSVT (nonsustained ventricular tachycardia) (HCC)    a.)  Single episode lasting 5 beats at a maximum rate of 160 bpm noted on Holter study performed 08/13/2021.   Prostate cancer [facility]) 01-14-2011   a.) s/p XRT   PSVT (paroxysmal supraventricular tachycardia) (HCC)    a. 06/2019 Zio: Avg rate 74 (54-120), occas PACs, rare PVCs, 125 episodes of PVCs (longest 17.5 secs; max rate 187). No afib.   RBBB    SAH (subarachnoid hemorrhage) (HCC) 10/10/2018   Sleep apnea    Squamous cell carcinoma of skin 07/18/2017   Left medial calf. KA type   Squamous cell carcinoma of skin 04/24/2018   Right above med. brow   Squamous cell carcinoma of skin 06/11/2019   Right posterior shoulder. SCCis, hypertrophic   Squamous cell carcinoma of skin Jan 14, 2020   Mid nasal dorsum, MOHS, Efudex x 4wks   Substance abuse (HCC)    Systolic dysfunction    a.) TTE 10/05/2015: EF 50-55%; mild LVH; LAE; triv AR, mild MR. b.) TTE 03/29/2018: EF 55-60%; LAE, mild AR; ? small PFO. c.) TTE 06/09/2019: EF 55-60%, no rwma, triv MR/AI. d.) TTE 06/26/2021: EF 45-50%; glob HK; LAE; mild MR; Ao root 41 mm; asc Ao 39 mm.    Vasovagal syncope     Past Surgical History:  Procedure Laterality Date   CARDIAC CATHETERIZATION  Jan 13, 1997   [place] no stents   CATARACT EXTRACTION, BILATERAL     COLON RESECTION SIGMOID N/A 12/07/2016   Procedure: COLON RESECTION SIGMOID;  Surgeon: Shelva Dunnings, MD;  Location: [facility] ORS;  Service: General;  Laterality: N/A;   COLON SURGERY  11/2016   Colostomy   COLONOSCOPY  01/14/2015   COLONOSCOPY WITH PROPOFOL  N/A 05/30/2016   Procedure: COLONOSCOPY WITH PROPOFOL ;  Surgeon: Gladis RAYMOND Mariner, MD;  Location: [facility] ENDOSCOPY;  Service: Endoscopy;  Laterality: N/A;   COLOSTOMY Left 12/07/2016   Procedure: COLOSTOMY;  Surgeon: Shelva Dunnings, MD;  Location: [facility] ORS;  Service: General;  Laterality: Left;   COLOSTOMY REVERSAL N/A 03/21/2017   Procedure: COLOSTOMY  REVERSAL;  Surgeon: Shelva Dunnings, MD;  Location: [facility] ORS;  Service: General;  Laterality: N/A;   COLOSTOMY TAKEDOWN N/A 03/21/2017   Procedure: LAPAROSCOPIC COLOSTOMY TAKEDOWN;  Surgeon: Shelva Dunnings, MD;  Location: [facility] ORS;  Service: General;  Laterality: N/A;   CYSTOSCOPY WITH STENT PLACEMENT Bilateral 03/21/2017   Procedure: CYSTOSCOPY WITH LIGHTED STENT PLACEMENT;  Surgeon: Twylla Glendia BROCKS, MD;  Location: [facility] ORS;  Service: Urology;  Laterality: Bilateral;   ESOPHAGOGASTRODUODENOSCOPY     ESOPHAGOGASTRODUODENOSCOPY N/A 05/30/2016   Procedure: ESOPHAGOGASTRODUODENOSCOPY (EGD);  Surgeon: Gladis RAYMOND Mariner, MD;  Location: [facility] ENDOSCOPY;  Service: Endoscopy;  Laterality: N/A;   ESOPHAGOGASTRODUODENOSCOPY N/A 01/14/2021   Procedure: ESOPHAGOGASTRODUODENOSCOPY (EGD);  Surgeon: Toledo, Ladell POUR, MD;  Location: [facility] ENDOSCOPY;  Service: Gastroenterology;  Laterality: N/A;   ESOPHAGOGASTRODUODENOSCOPY (EGD) WITH PROPOFOL  N/A 04/28/2021   Procedure: ESOPHAGOGASTRODUODENOSCOPY (EGD) WITH PROPOFOL ;  Surgeon: Toledo, Ladell POUR, MD;  Location: [facility] ENDOSCOPY;  Service: Gastroenterology;  Laterality: N/A;   EYE SURGERY      cataracts   FLEXIBLE SIGMOIDOSCOPY N/A 03/21/2017   Procedure: FLEXIBLE SIGMOIDOSCOPY;  Surgeon: Shelva Dunnings, MD;  Location: [facility] ORS;  Service: General;  Laterality: N/A;   FLEXIBLE SIGMOIDOSCOPY N/A 06/24/2021   Procedure: ENID MORIN;  Surgeon: Therisa Bi, MD;  Location: [facility] ENDOSCOPY;  Service: Gastroenterology;  Laterality: N/A;   FRACTURE SURGERY Bilateral    right arm and left wrist   INCISION AND DRAINAGE ABSCESS N/A 12/07/2016   Procedure: DRAINAGE  OF INTRA ABDOMINAL ABSCESS;  Surgeon: Shelva Dunnings, MD;  Location: [facility] ORS;  Service: General;  Laterality: N/A;   JOINT REPLACEMENT     LAPAROTOMY N/A 12/07/2016   Procedure: EXPLORATORY LAPAROTOMY;  Surgeon: Shelva Dunnings, MD;  Location: [facility] ORS;  Service: General;  Laterality: N/A;   PROSTATE SURGERY     Microwave therapy   TONSILLECTOMY     TOTAL HIP ARTHROPLASTY Right 09/27/2021   Procedure: TOTAL HIP ARTHROPLASTY ANTERIOR APPROACH;  Surgeon: Leora Lynwood SAUNDERS, MD;  Location: [facility] ORS;  Service: Orthopedics;  Laterality: Right;   TOTAL KNEE ARTHROPLASTY Right 07/27/2016   Procedure: TOTAL KNEE ARTHROPLASTY;  Surgeon: Kayla Pinal, MD;  Location: [facility] ORS;  Service: Orthopedics;  Laterality: Right;  Dr. Penne had to place Urinary catheter due to prostate cancer history.  Using flexible scope.       Scheduled Meds:  apixaban   5 mg Oral BID   atorvastatin   40 mg Oral Daily   docusate sodium   200 mg Oral Daily   folic acid   1 mg Oral Daily   furosemide   20 mg Intravenous BID   metoprolol  tartrate  50 mg Oral BID   multivitamin with minerals  1 tablet Oral Daily   nicotine   21 mg Transdermal Daily   sertraline   100 mg Oral Daily   thiamine   100 mg Oral Daily   Or   thiamine   100 mg Intravenous Daily   Continuous Infusions:  PRN Meds: acetaminophen  **OR** acetaminophen , LORazepam  **OR** LORazepam , ondansetron  **OR** ondansetron  (ZOFRAN ) IV  Allergies:    Allergies  Allergen Reactions    Amoxicillin -Pot Clavulanate Other (See Comments)    Abdominal upset  Has patient had a PCN reaction causing immediate rash, facial/tongue/throat swelling, SOB or lightheadedness with hypotension: Unknown  Has patient had a PCN reaction causing severe rash involving mucus membranes or skin necrosis: Unknown  Has patient had a PCN reaction that required hospitalization: Unknown  Has patient had a PCN reaction occurring within the last 10 years: Unknown  If all of the  above answers are NO, then may proceed with Cephalosporin use.  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  Tobacco Use   Smoking status: Former    Current packs/day: 0.00    Average packs/day: 1 pack/day for 27.0 years (27.0 ttl pk-yrs)    Types: Cigarettes    Start date: 04/18/1961    Quit date: 04/18/1988    Years since quitting: 35.7   Smokeless tobacco: Current    Types: [place]    Last attempt to quit: 12/20/2021  Vaping Use   Vaping status: Never Used  Substance and Sexual Activity   Alcohol use: Yes    Alcohol/week: 7.0 standard drinks of alcohol    Types: 7 Glasses of wine per week    Comment: 1 glass per day-Former heavy use ETOH   Drug use: No   Sexual activity: Not Currently    Birth control/protection: None    Comment: Married  Other Topics Concern   Not on file  Social History Narrative   1 son deceased, 1 daughter living   Social Drivers of Corporate investment banker Strain: Low Risk  (11/24/2023)   Overall Financial Resource Strain (CARDIA)    Difficulty of Paying Living Expenses: Not very hard  Food Insecurity: No Food Insecurity (12/27/2023)   Hunger Vital  Sign    Worried About Running Out of Food in the Last Year: Never true    Ran Out of Food in the Last Year: Never true  Transportation Needs: No Transportation Needs (12/27/2023)   PRAPARE - Administrator, Civil Service (Medical): No    Lack of Transportation (Non-Medical): No  Physical Activity: Inactive (11/24/2023)   Exercise Vital Sign    Days of Exercise per Week: 0 days    Minutes of Exercise per Session: Not on file  Stress: Stress Concern Present (11/24/2023)   [employer] of Occupational Health - Occupational Stress Questionnaire    Feeling of Stress: To some extent  Social Connections: Patient Declined (12/22/2023)   Social Connection and Isolation Panel    Frequency of Communication with Friends and Family: Patient declined    Frequency of Social Gatherings with Friends and Family: Patient declined    Attends Religious Services: Patient declined    Database administrator or Organizations: Patient declined    Attends Banker Meetings: Patient declined    Marital Status: Patient declined  Intimate Partner Violence: Not At Risk (12/27/2023)   Humiliation, Afraid, Rape, and Kick questionnaire    Fear of Current or Ex-Partner: No    Emotionally Abused: No    Physically Abused: No    Sexually Abused: No    Family History:    Family History  Problem Relation Age of Onset   Lung cancer Father        smoker   Cancer Father    Other Mother    Vision loss Mother    Sudden death Son        due to Blood clots   Bipolar disorder Son    Heart disease Son    Early death Son  Learning disabilities Son    Kidney disease Daughter        congenital one small kidney   Varicose Veins Sister    Prostate cancer Neg Hx    Bladder Cancer Neg Hx      ROS:  Please see the history of present illness.  All other ROS reviewed and negative.     Physical Exam/Data: Vitals:   01/01/24 0700 01/01/24 0800 01/01/24 0830 01/01/24 0930  BP: 106/82 110/72  101/74 106/66  Pulse: 60 63 61 80  Resp: 13 14 14  (!) 22  Temp:      TempSrc:      SpO2: 97% 95% 98% 98%  Weight:        Intake/Output Summary (Last 24 hours) at 01/01/2024 1125 Last data filed at 01/01/2024 0716 Gross per 24 hour  Intake --  Output 870 ml  Net -870 ml      12/31/2023    8:24 PM 12/26/2023   11:03 AM 12/20/2023   10:43 PM  Last 3 Weights  Weight (lbs) 215 lb 214 lb 8 oz 210 lb  Weight (kg) 97.523 kg 97.297 kg 95.255 kg     Body mass index is 32.69 kg/m.  General:  Well nourished, well developed, in no acute distress HEENT: normal Neck: no JVD Vascular: No carotid bruits; Distal pulses 2+ bilaterally Cardiac: IRIR; normal S1, S2; rate regular; no murmur  Lungs:  clear to auscultation bilaterally, no wheezing, rhonchi or rales  Abd: soft, nontender, no hepatomegaly  Ext: Trace R lower extremity edema, no L lower extremity edema Skin: warm and dry  Psych:  Normal affect   EKG:  The EKG was personally reviewed and demonstrates: Atrial fibrillation, RBBB, left axis deviation with rate 132 bpm. Telemetry:  Telemetry was personally reviewed and demonstrates: Atrial fibrillation with rare PVCs, rate 80-100 bpm  Relevant CV Studies:  12/22/2023 Echo complete  1. Left ventricular ejection fraction, by estimation, is 55 to 60%. Left  ventricular ejection fraction by PLAX is 66 %. The left ventricle has  normal function. The left ventricle has no regional wall motion  abnormalities. There is mild left ventricular  hypertrophy. Left ventricular diastolic parameters are indeterminate.   2. Right ventricular systolic function is normal. The right ventricular  size is normal.   3. The mitral valve is normal in structure. No evidence of mitral valve  regurgitation.   4. The aortic valve was not well visualized. Aortic valve regurgitation  is not visualized.   Laboratory Data: High Sensitivity Troponin:   Recent Labs  Lab 12/20/23 2251 12/21/23 0050 12/31/23 2026  01/01/24 0105  TROPONINIHS 21* 50* 14 13     Chemistry Recent Labs  Lab 12/26/23 1147 12/31/23 2026  NA 140 135  K 4.2 3.8  CL 102 104  CO2 23 23  GLUCOSE 89 126*  BUN 27 19  CREATININE 1.52* 1.37*  CALCIUM  9.7 8.9  GFRNONAA  --  51*  ANIONGAP  --  8    Recent Labs  Lab 01/01/24 0105  PROT 6.3*  ALBUMIN  3.2*  AST 24  ALT 20  ALKPHOS 49  BILITOT 0.6   Lipids No results for input(s): CHOL, TRIG, HDL, LABVLDL, LDLCALC, CHOLHDL in the last 168 hours.  Hematology Recent Labs  Lab 12/26/23 1147 12/31/23 2026  WBC 8.8 10.3  RBC 3.91* 3.79*  HGB 11.6* 11.4*  HCT 36.8* 35.6*  MCV 94 93.9  MCH 29.7 30.1  MCHC 31.5 32.0  RDW 12.2 12.8  PLT 241 224   Thyroid  No results for input(s): TSH, FREET4 in the last 168 hours.  BNP Recent Labs  Lab 01/01/24 0105  BNP 523.8*    DDimer No results for input(s): DDIMER in the last 168 hours.  Radiology/Studies:  CT Angio Chest PE W and/or Wo Contrast Result Date: 01/01/2024 IMPRESSION: 1. No pulmonary embolism. 2. Stable fusiform dilation of the thoracic aorta measuring 4.3 cm at the level of the sinuses of Valsalva, 4.1 cm in its ascending segment, and 3.8 cm in its proximal descending segment. Recommend annual imaging follow-up by CTA or MRA. 3. Progressive mild mediastinal adenopathy with multiple right paratracheal, aortopulmonary, and subsequent lymph nodes measuring up to 12-13 mm in short axis diameter. This is nonspecific, but may be reactive, inflammatory, or lymphoproliferative in etiology. Follow-up CT or PET CT examination would be helpful in 3 months to demonstrate stability or resolution. 4. Mosaic attenuation of the pulmonary parenchyma in keeping with air trapping related to small airways disease, with superimposed extensive bronchial wall thickening in keeping with airway inflammation. Bronchomalacia involving the right bronchus intermedius noted. Electronically signed by: Dorethia Molt MD 01/01/2024  02:39 AM EDT RP Workstation: [id number]   US  Venous Img Lower Unilateral Right Result Date: 01/01/2024 IMPRESSION: 1. No evidence of deep venous thrombosis. 2. Diffuse soft tissue/interstitial edema within the subcutaneous soft tissues of the right lower leg. Electronically Signed   By: Morene Hoard M.D.   On: 01/01/2024 02:29   DG Chest 2 View Result Date: 12/31/2023 IMPRESSION: Vascular congestion without edema. Electronically Signed   By: Oneil Devonshire M.D.   On: 12/31/2023 20:46   Assessment and Plan:  Atrial fibrillation with RVR - Recent hospitalization earlier this month for atrial fibrillation with RVR. Discharged with rates well controlled on metoprolol  and diltiazem . He was started on Eliquis  with plan for DCCV after 3 weeks of anticoagulation.  - Patient presented yesterday evening with chest discomfort and found to be in atrial fibrillation with RVR up to 130s bpm - Given IV metoprolol  2.5 mg and 5 mg with improvement in rates - Metoprolol  tartrate resumed at 50 mg twice daily - Telemetry shows atrial fibrillation with rate well controlled at 80-100 bpm - Will defer resuming PTA diltiazem  at this time given borderline low BP - Recommend K > 4 and mag > 2 - Continue Eliquis  5 mg twice daily for CHA2DS2VASc 7 - Given rates are well controlled and patient is largely asymptomatic at this time, recommend continued rate control strategy with plan for DCCV after 3 weeks of anticoagulation if patient remains in atrial fibrillation  Chest pain - Largely atypical in nature - Troponin negative x 2  - EKG without acute ischemic changes - Suspect this is likely secondary to RVR, pain largely resolved with rate control - No indication for ischemic evaluation at this time  HFimpEF Hypertension - Most recent echo 12/22/2023 with EF 55 to 60%. History of G2DD although could not be assessed on most recent echo.  - BNP 523 - Given IV Lasix  20 mg x 2 in the ED with -870 mL net output -  Appears euvolemic on exam although difficult to assess due to body habitus - Will discontinue IV Lasix , recommend Lasix  20 mg daily as needed for weight gain of 3 pounds in a day or 5 pounds in a week at discharge - Continue metoprolol  as above   History of GI bleed History of sigmoid colectomy secondary to diverticulosis - No hematochezia, abdominal pain or  other pertinent symptoms at this time - Continue to monitor with anticoagulation   Stage 3a chronic kidney disease (HCC) - Renal function appears near baseline - Continue to monitor with diuresis   Alcohol use disorder - Recommend cessation, likely contributing to afib   Risk Assessment/Risk Scores:  CHA2DS2-VASc Score = 7   This indicates a 11.2% annual risk of stroke. The patient's score is based upon: CHF History: 1 HTN History: 1 Diabetes History: 0 Stroke History: 2 Vascular Disease History: 1 Age Score: 2 Gender Score: 0      For questions or updates, please contact [facility] Please consult [email or web address] for contact info under      Signed, Lesley LITTIE Maffucci, PA-C  01/01/2024 11:25 AM

## 2024-01-01 NOTE — Assessment & Plan Note (Signed)
 Likely related to RVR Troponins negative and EKG nonacute

## 2024-01-01 NOTE — Assessment & Plan Note (Signed)
 -  Renal function  at baseline

## 2024-01-01 NOTE — Assessment & Plan Note (Signed)
 Heart rate improved with bolus doses of metoprolol  IV in the ED Resume home oral metoprolol  Continue Eliquis  Consider cardiology consult

## 2024-01-01 NOTE — ED Provider Notes (Signed)
 [facility] Provider Note    Event Date/Time   First MD Initiated Contact with Patient 01/01/24 0034     (approximate)   History   Chest Pain  Patient c/o chest pain since 1730, getting progressively worse.  Patient also endorses tremors in hands.  Patient denies sob, n/v.    HPI Cameron Peckham. is a 83 y.o. male paroxysmal A-fib, ascending aortic aneurysm, prior GI bleed, prior subarachnoid hemorrhage, basal cell carcinoma, hyperlipidemia, hypertension, depression, anxiety, diastolic CHF presents for evaluation of chest pain - Patient states earlier this evening he started developing a pleuritic chest pain that was severe.  Still has some ongoing pleuritic discomfort though pain is less pronounced.  Also feels his heart rate has stayed fast despite taking his metoprolol  and diltiazem  as already prescribed.  Had ongoing cough over the past several days as well, no fevers.  Cough is productive with yellow/green sputum. - Also notes he has gained about 5 pounds over the past 2 days and has worsening lower extremity edema.  Not currently on any diuretics.  Chronically has right leg swelling greater than left. - Is taking his Eliquis  as prescribed  Per chart review, patient was recently admitted 9/3-12/24/2023 after presenting to the ED with palpitations.  Found to be in A-fib RVR.  Workup with AKI, otherwise unremarkable.  Discharged with Cardizem  dose of 140 mg daily, on Eliquis , also on metoprolol .  Echo with normal EF and no wall motion abnormality.  Course complicated by episodes of hypotension.     Physical Exam   Triage Vital Signs: ED Triage Vitals  Encounter Vitals Group     BP 12/31/23 2023 108/78     Girls Systolic BP Percentile --      Girls Diastolic BP Percentile --      Boys Systolic BP Percentile --      Boys Diastolic BP Percentile --      Pulse Rate 12/31/23 2023 89     Resp 12/31/23 2023 17     Temp 12/31/23 2023 98.3 F (36.8 C)      Temp Source 01/01/24 0014 Oral     SpO2 12/31/23 2023 96 %     Weight 12/31/23 2024 215 lb (97.5 kg)     Height --      Head Circumference --      Peak Flow --      Pain Score 12/31/23 2024 6     Pain Loc --      Pain Education --      Exclude from Growth Chart --     Most recent vital signs: Vitals:   01/01/24 0545 01/01/24 0551  BP:    Pulse: 68 69  Resp: 17 17  Temp:    SpO2: 96% 97%   Heart rate ranging from about 100-120 throughout my evaluation.   General: Awake, no distress.  CV:  Good peripheral perfusion.  Tachycardic, irregular rhythm, RP 2+.  No chest wall tenderness.  Bilateral lower extremity edema present, right greater than left. Resp:  Normal effort. CTAB though somewhat diminished throughout Abd:  No distention. Nontender to deep palpation throughout    ED Results / Procedures / Treatments   Labs (all labs ordered are listed, but only abnormal results are displayed) Labs Reviewed  BASIC METABOLIC PANEL WITH GFR - Abnormal; Notable for the following components:      Result Value   Glucose, Bld 126 (*)    Creatinine, Ser 1.37 (*)  GFR, Estimated 51 (*)    All other components within normal limits  CBC - Abnormal; Notable for the following components:   RBC 3.79 (*)    Hemoglobin 11.4 (*)    HCT 35.6 (*)    All other components within normal limits  BRAIN NATRIURETIC PEPTIDE - Abnormal; Notable for the following components:   B Natriuretic Peptide 523.8 (*)    All other components within normal limits  HEPATIC FUNCTION PANEL - Abnormal; Notable for the following components:   Total Protein 6.3 (*)    Albumin  3.2 (*)    All other components within normal limits  LIPASE, BLOOD  TROPONIN I (HIGH SENSITIVITY)  TROPONIN I (HIGH SENSITIVITY)     EKG  ED course below --A-fib RVR   RADIOLOGY Radiology interpreted by myself and radiology reports reviewed.  Chest x-ray with vascular congestion.    CT PE with no underlying PE.  Some possible  adenopathy.  No clear evidence of infection.  DVT US  negative.   PROCEDURES:  Critical Care performed: Yes, see critical care procedure note(s)  .Critical Care  Performed by: Clarine Ozell LABOR, MD Authorized by: Clarine Ozell LABOR, MD   Critical care provider statement:    Critical care time (minutes):  30   Critical care time was exclusive of:  Separately billable procedures and treating other patients   Critical care was necessary to treat or prevent imminent or life-threatening deterioration of the following conditions:  Cardiac failure   Critical care was time spent personally by me on the following activities:  Development of treatment plan with patient or surrogate, discussions with consultants, evaluation of patient's response to treatment, examination of patient, ordering and review of laboratory studies, ordering and review of radiographic studies, ordering and performing treatments and interventions, pulse oximetry, re-evaluation of patient's condition and review of old charts   I assumed direction of critical care for this patient from another provider in my specialty: no     Care discussed with: admitting provider      MEDICATIONS ORDERED IN ED: Medications  atorvastatin  (LIPITOR) tablet 40 mg (has no administration in time range)  metoprolol  tartrate (LOPRESSOR ) tablet 50 mg (has no administration in time range)  sertraline  (ZOLOFT ) tablet 100 mg (has no administration in time range)  docusate sodium  (COLACE) capsule 200 mg (has no administration in time range)  apixaban  (ELIQUIS ) tablet 5 mg (has no administration in time range)  acetaminophen  (TYLENOL ) tablet 650 mg (has no administration in time range)    Or  acetaminophen  (TYLENOL ) suppository 650 mg (has no administration in time range)  ondansetron  (ZOFRAN ) tablet 4 mg (has no administration in time range)    Or  ondansetron  (ZOFRAN ) injection 4 mg (has no administration in time range)  furosemide  (LASIX ) injection  20 mg (has no administration in time range)  nicotine  (NICODERM CQ  - dosed in mg/24 hours) patch 21 mg (has no administration in time range)  LORazepam  (ATIVAN ) tablet 1-4 mg (has no administration in time range)    Or  LORazepam  (ATIVAN ) injection 1-4 mg (has no administration in time range)  thiamine  (VITAMIN B1) tablet 100 mg (has no administration in time range)    Or  thiamine  (VITAMIN B1) injection 100 mg (has no administration in time range)  folic acid  (FOLVITE ) tablet 1 mg (has no administration in time range)  multivitamin with minerals tablet 1 tablet (has no administration in time range)  furosemide  (LASIX ) injection 20 mg (20 mg Intravenous Given 01/01/24 0116)  metoprolol  tartrate (LOPRESSOR ) injection 2.5 mg (2.5 mg Intravenous Given 01/01/24 0114)  acetaminophen  (TYLENOL ) tablet 1,000 mg (1,000 mg Oral Given 01/01/24 0115)  iohexol  (OMNIPAQUE ) 350 MG/ML injection 75 mL (75 mLs Intravenous Contrast Given 01/01/24 0141)  metoprolol  tartrate (LOPRESSOR ) injection 5 mg (5 mg Intravenous Given 01/01/24 0341)     IMPRESSION / MDM / ASSESSMENT AND PLAN / ED COURSE  I reviewed the triage vital signs and the nursing notes.                              DDX/MDM/AP: Differential diagnosis includes, but is not limited to, primary cardiac arrhythmia, consider possibility of underlying PE that may have precipitated A-fib in the first place--has been on anticoagulation for about 10 days which is reassuring that I do not otherwise have a great explanation for his recent chest pain beyond possible pleurisy.  Consider ACS though presentation somewhat atypical.  Consider discomfort secondary to underlying arrhythmia.  Do not suspect intra-abdominal pathology at this time.  Do suspect some component of CHF contributing as well, does appear volume overloaded here  Plan: - Labs - EKG - Chest x-ray - CT PE -  DVT ultrasound -Low-dose IV metoprolol  rate control, will be cautious given previous  hypotension with such medication -Anticipate need for diuresis - Reassess, low threshold for admission  Patient's presentation is most consistent with acute presentation with potential threat to life or bodily function.  The patient is on the cardiac monitor to evaluate for evidence of arrhythmia and/or significant heart rate changes.  ED course below.  Initial laboratory workup reassuring, diuresed with 20 mg IV Lasix .  Initially treated with small dose of 2.5 mg IV metoprolol , remained tachycardic though blood pressure did not drop so escalated to second dose of IV beta-blocker to which she responded better though with some persistent intermittent tachycardia.  Given evidence of CHF exacerbation and ongoing intermittent A-fib RVR, I do believe this 83 year old recently hospitalized patient with prior episodes of hypotension would benefit from further inpatient care for diuresis and close heart rate and blood pressure monitoring.  Admitted to hospitalist service for A-fib RVR and CHF exacerbation.  Clinical Course as of 01/01/24 0718  Mon Jan 01, 2024  0040 CBC with no leukocytosis, stable anemia   [MM]  0040 BMP reviewed, unremarkable, creatinine within baseline range [MM]  0041 Initial troponin normal [MM]  0041 Chest x-ray with no acute pathology on my interpretation, some possible vascular congestion, radiology report reviewed:  IMPRESSION: Vascular congestion without edema.   [MM]  0041 Ecg = atrial fibrillation, rate 132, no gross ST elevation or depression, no significant repolarization, right bundle branch block present, leftward axis.  No clear evidence of ischemia on my interpretation. [MM]  0242 CTPE: IMPRESSION: 1. No pulmonary embolism. 2. Stable fusiform dilation of the thoracic aorta measuring 4.3 cm at the level of the sinuses of Valsalva, 4.1 cm in its ascending segment, and 3.8 cm in its proximal descending segment. Recommend annual imaging follow-up by CTA or  MRA. 3. Progressive mild mediastinal adenopathy with multiple right paratracheal, aortopulmonary, and subsequent lymph nodes measuring up to 12-13 mm in short axis diameter. This is nonspecific, but may be reactive, inflammatory, or lymphoproliferative in etiology. Follow-up CT or PET CT examination would be helpful in 3 months to demonstrate stability or resolution. 4. Mosaic attenuation of the pulmonary parenchyma in keeping with air trapping related to small airways disease, with superimposed extensive  bronchial wall thickening in keeping with airway inflammation. Bronchomalacia involving the right bronchus intermedius noted.   [MM]  0242 US : IMPRESSION: 1. No evidence of deep venous thrombosis. 2. Diffuse soft tissue/interstitial edema within the subcutaneous soft tissues of the right lower leg.   [MM]  [phone] Patient reevaluated, remains with heart rate jumping up to the 120s at times.  Feeling better after diuresis.  Given ongoing A-fib RVR and evidence of CHF exacerbation in this medically frail recently discharge gentleman, I believe he would benefit from further inpatient treatment.  Giving further IV metoprolol   Hospitalist consult order placed [MM]    Clinical Course User Index [MM] Clarine Ozell LABOR, MD     FINAL CLINICAL IMPRESSION(S) / ED DIAGNOSES   Final diagnoses:  Atrial fibrillation with rapid ventricular response (HCC)  Congestive heart failure, unspecified HF chronicity, unspecified heart failure type (HCC)  Chest pain, unspecified type     Rx / DC Orders   ED Discharge Orders          Ordered    Amb referral to AFIB Clinic        01/01/24 0357             Note:  This document was prepared using Dragon voice recognition software and may include unintentional dictation errors.   Clarine Ozell LABOR, MD 01/01/24 [phone]

## 2024-01-01 NOTE — Hospital Course (Signed)
 Cameron Foster

## 2024-01-02 ENCOUNTER — Telehealth

## 2024-01-02 ENCOUNTER — Encounter: Payer: Self-pay | Admitting: Internal Medicine

## 2024-01-02 ENCOUNTER — Telehealth (HOSPITAL_COMMUNITY): Payer: Self-pay | Admitting: Pharmacy Technician

## 2024-01-02 ENCOUNTER — Other Ambulatory Visit (HOSPITAL_COMMUNITY): Payer: Self-pay

## 2024-01-02 DIAGNOSIS — G4730 Sleep apnea, unspecified: Secondary | ICD-10-CM | POA: Diagnosis not present

## 2024-01-02 DIAGNOSIS — E785 Hyperlipidemia, unspecified: Secondary | ICD-10-CM | POA: Diagnosis not present

## 2024-01-02 DIAGNOSIS — C44629 Squamous cell carcinoma of skin of left upper limb, including shoulder: Secondary | ICD-10-CM | POA: Diagnosis not present

## 2024-01-02 DIAGNOSIS — I340 Nonrheumatic mitral (valve) insufficiency: Secondary | ICD-10-CM | POA: Diagnosis not present

## 2024-01-02 DIAGNOSIS — I4510 Unspecified right bundle-branch block: Secondary | ICD-10-CM | POA: Diagnosis not present

## 2024-01-02 DIAGNOSIS — I420 Dilated cardiomyopathy: Secondary | ICD-10-CM | POA: Diagnosis not present

## 2024-01-02 DIAGNOSIS — Z8546 Personal history of malignant neoplasm of prostate: Secondary | ICD-10-CM | POA: Diagnosis not present

## 2024-01-02 DIAGNOSIS — F17220 Nicotine dependence, chewing tobacco, uncomplicated: Secondary | ICD-10-CM | POA: Diagnosis not present

## 2024-01-02 DIAGNOSIS — I130 Hypertensive heart and chronic kidney disease with heart failure and stage 1 through stage 4 chronic kidney disease, or unspecified chronic kidney disease: Secondary | ICD-10-CM | POA: Diagnosis not present

## 2024-01-02 DIAGNOSIS — K589 Irritable bowel syndrome without diarrhea: Secondary | ICD-10-CM | POA: Diagnosis not present

## 2024-01-02 DIAGNOSIS — Z8673 Personal history of transient ischemic attack (TIA), and cerebral infarction without residual deficits: Secondary | ICD-10-CM | POA: Diagnosis not present

## 2024-01-02 DIAGNOSIS — K219 Gastro-esophageal reflux disease without esophagitis: Secondary | ICD-10-CM | POA: Diagnosis not present

## 2024-01-02 DIAGNOSIS — Z8719 Personal history of other diseases of the digestive system: Secondary | ICD-10-CM | POA: Diagnosis not present

## 2024-01-02 DIAGNOSIS — R079 Chest pain, unspecified: Secondary | ICD-10-CM | POA: Diagnosis not present

## 2024-01-02 DIAGNOSIS — Z8679 Personal history of other diseases of the circulatory system: Secondary | ICD-10-CM | POA: Diagnosis not present

## 2024-01-02 DIAGNOSIS — I5030 Unspecified diastolic (congestive) heart failure: Secondary | ICD-10-CM | POA: Diagnosis not present

## 2024-01-02 DIAGNOSIS — Z96641 Presence of right artificial hip joint: Secondary | ICD-10-CM | POA: Diagnosis not present

## 2024-01-02 DIAGNOSIS — Z85828 Personal history of other malignant neoplasm of skin: Secondary | ICD-10-CM | POA: Diagnosis not present

## 2024-01-02 DIAGNOSIS — M5416 Radiculopathy, lumbar region: Secondary | ICD-10-CM | POA: Diagnosis not present

## 2024-01-02 DIAGNOSIS — R5381 Other malaise: Secondary | ICD-10-CM | POA: Diagnosis not present

## 2024-01-02 DIAGNOSIS — Z96651 Presence of right artificial knee joint: Secondary | ICD-10-CM | POA: Diagnosis not present

## 2024-01-02 DIAGNOSIS — J42 Unspecified chronic bronchitis: Secondary | ICD-10-CM | POA: Diagnosis not present

## 2024-01-02 DIAGNOSIS — I5042 Chronic combined systolic (congestive) and diastolic (congestive) heart failure: Secondary | ICD-10-CM | POA: Diagnosis not present

## 2024-01-02 DIAGNOSIS — R0602 Shortness of breath: Secondary | ICD-10-CM | POA: Diagnosis not present

## 2024-01-02 DIAGNOSIS — I10 Essential (primary) hypertension: Secondary | ICD-10-CM | POA: Diagnosis not present

## 2024-01-02 DIAGNOSIS — N1831 Chronic kidney disease, stage 3a: Secondary | ICD-10-CM | POA: Diagnosis not present

## 2024-01-02 DIAGNOSIS — I700 Atherosclerosis of aorta: Secondary | ICD-10-CM | POA: Diagnosis not present

## 2024-01-02 DIAGNOSIS — I480 Paroxysmal atrial fibrillation: Secondary | ICD-10-CM | POA: Diagnosis not present

## 2024-01-02 DIAGNOSIS — I2510 Atherosclerotic heart disease of native coronary artery without angina pectoris: Secondary | ICD-10-CM | POA: Diagnosis not present

## 2024-01-02 DIAGNOSIS — Z87891 Personal history of nicotine dependence: Secondary | ICD-10-CM | POA: Diagnosis not present

## 2024-01-02 DIAGNOSIS — M1990 Unspecified osteoarthritis, unspecified site: Secondary | ICD-10-CM | POA: Diagnosis not present

## 2024-01-02 DIAGNOSIS — I4891 Unspecified atrial fibrillation: Secondary | ICD-10-CM | POA: Diagnosis not present

## 2024-01-02 DIAGNOSIS — F1090 Alcohol use, unspecified, uncomplicated: Secondary | ICD-10-CM | POA: Diagnosis not present

## 2024-01-02 DIAGNOSIS — E876 Hypokalemia: Secondary | ICD-10-CM | POA: Diagnosis not present

## 2024-01-02 DIAGNOSIS — F1010 Alcohol abuse, uncomplicated: Secondary | ICD-10-CM | POA: Diagnosis not present

## 2024-01-02 DIAGNOSIS — C61 Malignant neoplasm of prostate: Secondary | ICD-10-CM | POA: Diagnosis not present

## 2024-01-02 DIAGNOSIS — Z923 Personal history of irradiation: Secondary | ICD-10-CM | POA: Diagnosis not present

## 2024-01-02 DIAGNOSIS — I4819 Other persistent atrial fibrillation: Secondary | ICD-10-CM | POA: Diagnosis not present

## 2024-01-02 DIAGNOSIS — R251 Tremor, unspecified: Secondary | ICD-10-CM | POA: Diagnosis not present

## 2024-01-02 DIAGNOSIS — Z79899 Other long term (current) drug therapy: Secondary | ICD-10-CM | POA: Diagnosis not present

## 2024-01-02 DIAGNOSIS — Z7901 Long term (current) use of anticoagulants: Secondary | ICD-10-CM | POA: Diagnosis not present

## 2024-01-02 DIAGNOSIS — I5023 Acute on chronic systolic (congestive) heart failure: Secondary | ICD-10-CM | POA: Diagnosis not present

## 2024-01-02 DIAGNOSIS — I7121 Aneurysm of the ascending aorta, without rupture: Secondary | ICD-10-CM | POA: Diagnosis not present

## 2024-01-02 DIAGNOSIS — F1020 Alcohol dependence, uncomplicated: Secondary | ICD-10-CM | POA: Diagnosis not present

## 2024-01-02 DIAGNOSIS — I509 Heart failure, unspecified: Secondary | ICD-10-CM | POA: Diagnosis not present

## 2024-01-02 LAB — BASIC METABOLIC PANEL WITH GFR
Anion gap: 8 (ref 5–15)
BUN: 18 mg/dL (ref 8–23)
CO2: 27 mmol/L (ref 22–32)
Calcium: 8.7 mg/dL — ABNORMAL LOW (ref 8.9–10.3)
Chloride: 103 mmol/L (ref 98–111)
Creatinine, Ser: 1.29 mg/dL — ABNORMAL HIGH (ref 0.61–1.24)
GFR, Estimated: 55 mL/min — ABNORMAL LOW (ref >60)
Glucose, Bld: 106 mg/dL — ABNORMAL HIGH (ref 70–99)
Potassium: 3.5 mmol/L (ref 3.5–5.1)
Sodium: 138 mmol/L (ref 135–145)

## 2024-01-02 MED ORDER — FUROSEMIDE 10 MG/ML IJ SOLN
20.0000 mg | Freq: Once | INTRAMUSCULAR | Status: AC
  Administered 2024-01-02: 20 mg via INTRAVENOUS
  Filled 2024-01-02: qty 4

## 2024-01-02 MED ORDER — POTASSIUM CHLORIDE CRYS ER 20 MEQ PO TBCR
40.0000 meq | EXTENDED_RELEASE_TABLET | ORAL | Status: AC
  Administered 2024-01-02 (×2): 40 meq via ORAL
  Filled 2024-01-02 (×2): qty 2

## 2024-01-02 MED ORDER — FLUTICASONE PROPIONATE 50 MCG/ACT NA SUSP
1.0000 | Freq: Every day | NASAL | Status: DC
  Administered 2024-01-03 – 2024-01-04 (×2): 1 via NASAL
  Filled 2024-01-02: qty 16

## 2024-01-02 MED ORDER — METOPROLOL TARTRATE 50 MG PO TABS
75.0000 mg | ORAL_TABLET | Freq: Two times a day (BID) | ORAL | Status: DC
  Administered 2024-01-02 – 2024-01-03 (×3): 75 mg via ORAL
  Filled 2024-01-02 (×3): qty 1

## 2024-01-02 NOTE — Hospital Course (Signed)
 83yo with h/o PAF on Eliquis , chronic HFrEF, diverticulitis s/p sigmoid colectomy, CVA, HTN, stage 3a CKD, ETOH use d/o, and tobacco use who presented on 9/15 with chest pain. He was previously hospitalized from 9/3-7 for afib with RVR and was found to have recurrence. He was given Lasix  and IV metoprolol . He is planned for TEE/DCCV on 9/17.

## 2024-01-02 NOTE — Progress Notes (Signed)
 Progress Note   Patient: Cameron Foster. FMW:[id number] DOB: 09-08-1940 DOA: 01/01/2024     0 DOS: the patient was seen and examined on 01/02/2024   Brief hospital course: 83yo with h/o PAF on Eliquis , chronic HFrEF, diverticulitis s/p sigmoid colectomy, CVA, HTN, stage 3a CKD, ETOH use d/o, and tobacco use who presented on 9/15 with chest pain. He was previously hospitalized from 9/3-7 for afib with RVR and was found to have recurrence. He was given Lasix  and IV metoprolol . He is planned for TEE/DCCV on 9/17.   Assessment and Plan:  Atrial fibrillation with RVR Heart rate improved with bolus doses of metoprolol  IV in the ED Continue Eliquis  Cardiology consulted Resume home oral metoprolol , cardiology increased dose to 75 mg BID Loaded with amiodarone  and will continue this at time of dc Patient appears to need TEE/DCCV, which is planned for 9/17 He is optimistic that he will be able to go home tomorrow post-procedure   Acute on chronic HFrEF (heart failure with reduced ejection fraction)  Last EF 9/52025 55 to 60% Continue IV Lasix  Continue metoprolol  Daily weights with intake and output monitoring   Chest pain Likely related to RVR Troponins negative and EKG nonacute No plan for ischemic evaluation at this time   History of GI bleed History of sigmoid colectomy secondary to diverticulosis No acute concerns at this time   History of subarachnoid hemorrhage No acute issues   Stage 3a chronic kidney disease  Appears to be stable at this time Attempt to avoid nephrotoxic medications Recheck BMP in AM    Alcohol use disorder CIWA withdrawal protocol Cessation encouraged, as this is likely contributing to afib       Consultants: Cardiology   Procedures: RLE DVT US    Antibiotics: None    30 Day Unplanned Readmission Risk Score    Flowsheet Row ED to Hosp-Admission (Discharged) from 12/20/2023 in [facility]  30 Day Unplanned Readmission  Risk Score (%) 20.03 Filed at 12/24/2023 1200    This score is the patient's risk of an unplanned readmission within 30 days of being discharged (0 -100%). The score is based on dignosis, age, lab data, medications, orders, and past utilization.   Low:  0-14.9   Medium: 15-21.9   High: 22-29.9   Extreme: 30 and above           Subjective: Feeling ok, eager to go home when possible.  Understands about plan for procedure tomorrow.   Objective: Vitals:   01/02/24 0151 01/02/24 0859  BP: (!) 121/92 (!) 143/95  Pulse: 76 82  Resp: 20 18  Temp: 98.2 F (36.8 C) 97.9 F (36.6 C)  SpO2: 95% 96%    Intake/Output Summary (Last 24 hours) at 01/02/2024 1633 Last data filed at 01/02/2024 1100 Gross per 24 hour  Intake 720 ml  Output 400 ml  Net 320 ml   Filed Weights   12/31/23 2024 01/01/24 1934 01/02/24 0500  Weight: 97.5 kg 97.9 kg 97.4 kg    Exam:  General:  Appears calm and comfortable and is in NAD, on RA Eyes:  normal lids, iris ENT:  grossly normal hearing, lips & tongue, mmm Cardiovascular:  Irregularly irregular but generally rate controlled. R > L LE edema, patient states chronic.  Respiratory:   CTA bilaterally with no wheezes/rales/rhonchi.  Normal respiratory effort. Abdomen:  soft, NT, ND Skin:  no rash or induration seen on limited exam Musculoskeletal:  grossly normal tone BUE/BLE, good ROM, no bony  abnormality Psychiatric:  grossly normal mood and affect, speech fluent and appropriate, AOx3 Neurologic:  CN 2-12 grossly intact, moves all extremities in coordinated fashion  Data Reviewed: I have reviewed the patient's lab results since admission.  Pertinent labs for today include:   Glucose 106 BUN 18/Creatinine 1.29/GFR 55, stable     Family Communication: None present      Code Status: Full Code    Disposition: Status is: Inpatient Admit - It is my clinical opinion that admission to INPATIENT is reasonable and necessary because of the  expectation that this patient will require hospital care that crosses at least 2 midnights to treat this condition based on the medical complexity of the problems presented.  Given the aforementioned information, the predictability of an adverse outcome is felt to be significant.      Time spent: 50 minutes  Unresulted Labs (From admission, onward)     Start     Ordered   Unscheduled  CBC with Differential/Platelet  Tomorrow morning,   R        01/02/24 1633   Unscheduled  Basic metabolic panel with GFR  Tomorrow morning,   R        01/02/24 1633             Author: Delon Herald, MD 01/02/2024 4:33 PM  For on call review [email or web address].

## 2024-01-02 NOTE — Progress Notes (Signed)
 Declined to sign procedure consent at this time as he said that the risks have not been discussed with him yet.

## 2024-01-02 NOTE — Care Management Obs Status (Signed)
 MEDICARE OBSERVATION STATUS NOTIFICATION   Patient Details  Name: Cameron Foster. MRN: [id number] Date of Birth: Sep 25, 1940   Medicare Observation Status Notification Given:  Yes see note    [name] 01/02/2024, 12:45 PM

## 2024-01-02 NOTE — Progress Notes (Signed)
 Rounding Note   Patient Name: Cameron Foster. Date of Encounter: 01/02/2024  [facility] Cardiologist: Evalene Lunger, MD   Subjective Patient reports feeling well with no recurrence of chest pain.  He remains in atrial fibrillation with rates 90 to 140 bpm.  Plan for TEE/DCCV tomorrow which patient is agreeable to.  Scheduled Meds:  amiodarone   400 mg Oral BID   apixaban   5 mg Oral BID   atorvastatin   40 mg Oral Daily   docusate sodium   200 mg Oral Daily   folic acid   1 mg Oral Daily   metoprolol  tartrate  50 mg Oral BID   multivitamin with minerals  1 tablet Oral Daily   nicotine   21 mg Transdermal Daily   sertraline   100 mg Oral Daily   thiamine   100 mg Oral Daily   Or   thiamine   100 mg Intravenous Daily   Continuous Infusions:  PRN Meds: acetaminophen  **OR** acetaminophen , LORazepam  **OR** LORazepam , ondansetron  **OR** ondansetron  (ZOFRAN ) IV   Vital Signs  Vitals:   01/01/24 1934 01/02/24 0151 01/02/24 0500 01/02/24 0859  BP: 120/87 (!) 121/92  (!) 143/95  Pulse: 80 76  82  Resp: 20 20  18   Temp: 97.7 F (36.5 C) 98.2 F (36.8 C)  97.9 F (36.6 C)  TempSrc:      SpO2: 97% 95%  96%  Weight: 97.9 kg  97.4 kg   Height: 5' 9 (1.753 m)       Intake/Output Summary (Last 24 hours) at 01/02/2024 0956 Last data filed at 01/02/2024 0500 Gross per 24 hour  Intake 480 ml  Output 400 ml  Net 80 ml      01/02/2024    5:00 AM 01/01/2024    7:34 PM 12/31/2023    8:24 PM  Last 3 Weights  Weight (lbs) 214 lb 11.7 oz 215 lb 13.3 oz 215 lb  Weight (kg) 97.4 kg 97.9 kg 97.523 kg      Telemetry Atrial fibrillation rate 90 to 140 bpm- Personally Reviewed  Physical Exam  GEN: No acute distress.   Neck: No JVD Cardiac: IRIR, tachycardic, no murmurs, rubs, or gallops.  Respiratory: Clear to auscultation bilaterally. GI: Soft, nontender, non-distended  MS: Trace lower extremity edema, right worse than left; No deformity. Neuro:  Nonfocal  Psych: Normal  affect   Labs High Sensitivity Troponin:   Recent Labs  Lab 12/20/23 2251 12/21/23 0050 12/31/23 2026 01/01/24 0105  TROPONINIHS 21* 50* 14 13     Chemistry Recent Labs  Lab 12/31/23 2026 01/01/24 0105 01/01/24 2010 01/02/24 0438  NA 135  --  136 138  K 3.8  --  3.8 3.5  CL 104  --  101 103  CO2 23  --  26 27  GLUCOSE 126*  --  135* 106*  BUN 19  --  21 18  CREATININE 1.37*  --  1.57* 1.29*  CALCIUM  8.9  --  8.7* 8.7*  MG  --  2.2  --   --   PROT  --  6.3*  --   --   ALBUMIN   --  3.2*  --   --   AST  --  24  --   --   ALT  --  20  --   --   ALKPHOS  --  49  --   --   BILITOT  --  0.6  --   --   GFRNONAA 51*  --  43* 55*  ANIONGAP 8  --  9 8    Lipids No results for input(s): CHOL, TRIG, HDL, LABVLDL, LDLCALC, CHOLHDL in the last 168 hours.  Hematology Recent Labs  Lab 12/26/23 1147 12/31/23 2026 01/01/24 2010  WBC 8.8 10.3 7.7  RBC 3.91* 3.79* 3.57*  HGB 11.6* 11.4* 10.7*  HCT 36.8* 35.6* 33.5*  MCV 94 93.9 93.8  MCH 29.7 30.1 30.0  MCHC 31.5 32.0 31.9  RDW 12.2 12.8 12.7  PLT 241 224 186   Thyroid  No results for input(s): TSH, FREET4 in the last 168 hours.  BNP Recent Labs  Lab 01/01/24 0105  BNP 523.8*    DDimer No results for input(s): DDIMER in the last 168 hours.   Radiology  CT Angio Chest PE W and/or Wo Contrast Result Date: 01/01/2024 IMPRESSION: 1. No pulmonary embolism. 2. Stable fusiform dilation of the thoracic aorta measuring 4.3 cm at the level of the sinuses of Valsalva, 4.1 cm in its ascending segment, and 3.8 cm in its proximal descending segment. Recommend annual imaging follow-up by CTA or MRA. 3. Progressive mild mediastinal adenopathy with multiple right paratracheal, aortopulmonary, and subsequent lymph nodes measuring up to 12-13 mm in short axis diameter. This is nonspecific, but may be reactive, inflammatory, or lymphoproliferative in etiology. Follow-up CT or PET CT examination would be helpful in 3 months to  demonstrate stability or resolution. 4. Mosaic attenuation of the pulmonary parenchyma in keeping with air trapping related to small airways disease, with superimposed extensive bronchial wall thickening in keeping with airway inflammation. Bronchomalacia involving the right bronchus intermedius noted. Electronically signed by: Dorethia Molt MD 01/01/2024 02:39 AM EDT RP Workstation: [id number]   US  Venous Img Lower Unilateral Right Result Date: 01/01/2024 IMPRESSION: 1. No evidence of deep venous thrombosis. 2. Diffuse soft tissue/interstitial edema within the subcutaneous soft tissues of the right lower leg. Electronically Signed   By: Morene Hoard M.D.   On: 01/01/2024 02:29   DG Chest 2 View Result Date: 12/31/2023 IMPRESSION: Vascular congestion without edema. Electronically Signed   By: Oneil Devonshire M.D.   On: 12/31/2023 20:46    Cardiac Studies  12/22/2023 Echo complete  1. Left ventricular ejection fraction, by estimation, is 55 to 60%. Left ventricular ejection fraction by PLAX is 66 %. The left ventricle has normal function. The left ventricle has no regional wall motion abnormalities. There is mild left ventricular hypertrophy. Left ventricular diastolic parameters are indeterminate.   2. Right ventricular systolic function is normal. The right ventricular size is normal.   3. The mitral valve is normal in structure. No evidence of mitral valve regurgitation.   4. The aortic valve was not well visualized. Aortic valve regurgitation is not visualized.   Patient Profile   83 y.o. male  with a hx of  Paroxysmal atrial fibrillation on Eliquis , HFimpEF (EF 55-60% 12/22/2023), diverticulitis s/p sigmoid colectomy, GI bleed, lacunar CVA, HTN, and CKD stage IIIa who was admitted 9/15 with atrial fibrillation with RVR and associated chest discomfort.   Assessment & Plan   Atrial fibrillation with RVR - Recent hospitalization earlier this month for atrial fibrillation with RVR.   Discharged with rate well-controlled on metoprolol  and diltiazem .  Started on Eliquis  with plan for DCCV after 3 weeks of anticoagulation. - Patient presented 9/14 with chest discomfort and found to be in atrial fibrillation with RVR, up to 130s bpm - Given IV metoprolol  2.5 mg, 5 mg with improvement in rates - Remains in atrial fibrillation with rates 90 to  140 bpm - Increase metoprolol  to tartrate to 75 mg twice daily - Continue Eliquis  5 mg twice daily for CHA2DS2-VASc of 7 - Given rates have been more difficult to control, patient is scheduled for TEE/DCCV tomorrow.  He should remain n.p.o. after midnight tonight. - Continue amiodarone  load at 400 mg twice daily x 7 days followed by 200 mg twice daily for 7 days followed by 200 mg daily thereafter  Chest pain - Largely atypical in nature - Troponin negative x 2 - EKG without acute ischemic changes - Suspect this is likely secondary to RVR, pain largely resolved with rate control - No indication for ischemic evaluation at this time  HFimpEF Hypertension - Most recent echo 12/22/2023 with EF 55 to 60%.  History of G2 DD, although could not be assessed on most recent echo. - BNP 523 - Received IV Lasix  20 mg x 2 in the ED with good UOP - Appears euvolemic on exam, although difficult to assess due to body habitus - Consider Lasix  20 mg daily as needed at discharge - Continue metoprolol  as above  Hx GI bleed Hx sigmoid colectomy due to diverticulosis - Stable without symptoms at this time - Continue to monitor with anticoagulation  Stage IIIa CKD - Renal function appears near baseline  Alcohol use disorder - Recommend cessation, likely contributing to atrial fibrillation  Informed Consent   Shared Decision Making/Informed Consent   The risks [stroke, cardiac arrhythmias rarely resulting in the need for a temporary or permanent pacemaker, skin irritation or burns, esophageal damage, perforation (1:10,000 risk), bleeding, pharyngeal  hematoma as well as other potential complications associated with conscious sedation including aspiration, arrhythmia, respiratory failure and death], benefits (treatment guidance, restoration of normal sinus rhythm, diagnostic support) and alternatives of a transesophageal echocardiogram guided cardioversion were discussed in detail with Mr. Hinton and he is willing to proceed.      For questions or updates, please contact [facility] Please consult [email or web address] for contact info under       Signed, Lesley LITTIE Maffucci, PA-C  01/02/2024, 9:56 AM

## 2024-01-02 NOTE — Care Management Obs Status (Signed)
 MEDICARE OBSERVATION STATUS NOTIFICATION   Patient Details  Name: Cameron Foster. MRN: [id number] Date of Birth: 13-May-1940   Medicare Observation Status Notification Given:   patient refused to sign moon because he specifically requested to the doctor not to put him in observation and he has multiple bills from [facility] because his insurance doesn't pay for observation.      [name] 01/02/2024, 12:43 PM

## 2024-01-02 NOTE — Discharge Instructions (Signed)
..  Intensive Outpatient Programs   [facility] [address] [place],  [place], [place] [phone]  [facility] (Inpatient and outpatient)[phone] (Suboxone and Methadone) [address] [phone]  [facility] Programs - Intensive Outpatient [address] [place], [place] [address]  [phone]  [facility] (Outpatient, Inpatient, [facility] (Groups and Residental) (insurance only) [phone] [place], [place] [phone]   [facility] (for caregivers and family) [address] [place] [address] [place], [place] [phone]  Residential Treatment Programs  [facility](2 years) Residential: 78 days)[facility] [address] [employer], [place], [place] [phone] or [phone]  [facility] [address] [place], [place], [place] [phone]  [facility] ([facility]) [address] [place], [place], [place] [phone] Admissions: 8am-3pm M-F  [facility] Program: Residential Program [phone] Days)             [facility]: [place]  [facility], [place], [place]  [phone] or [phone] in Hours over the weekend or by referral)  [facility] [address] [place], [place] [address] [phone] (Do virtual or phone assessment, offer transportation within 25 miles, have in patient and Outpatient options)   [facility] Crisis: [facility]:[phone] (for crisis  response 24 hours a day)

## 2024-01-02 NOTE — TOC CM/SW Note (Signed)
 Transition of Care [facility]) - Inpatient Brief Assessment   Patient Details  Name: Cameron Foster. MRN: [id number] Date of Birth: 11-Feb-1941  Transition of [facility]) CM/SW Contact:    Corean ONEIDA Haddock, RN Phone Number: 01/02/2024, 1:35 PM   Clinical Narrative:  Transition of Care [facility]) Screening Note   Patient Details  Name: Cameron Foster. Date of Birth: 1940-09-23   Transition of Care [facility]) CM/SW Contact:    Corean ONEIDA Haddock, RN Phone Number: 01/02/2024, 1:35 PM    Transition of Care Department [facility]) has reviewed patient and no TOC needs have been identified at this time. If new patient transition needs arise, please place a TOC consult.  Substance abuse resources added to AVS     Transition of Care Asessment: Insurance and Status: Insurance coverage has been reviewed, Selfpay Patient has primary care physician: Yes     Prior/Current Home Services: No current home services Social Drivers of Health Review: SDOH reviewed no interventions necessary Readmission risk has been reviewed: Yes Transition of care needs: no transition of care needs at this time

## 2024-01-02 NOTE — Progress Notes (Signed)
 Heart Failure Stewardship Pharmacy Note  PCP: Myrla Jon [name], MD PCP-Cardiologist: Evalene Lunger, MD  HPI: Cameron Foster. is a 83 y.o. male with paroxysmal A-fib, ascending aortic aneurysm, prior GI bleed, prior subarachnoid hemorrhage, CVA, alcohol use, basal cell carcinoma, hyperlipidemia, hypertension, depression, anxiety, diastolic CHF who presented with chest pain. On admission, BNP was 523.8, HS-troponin was 14, and normal LFTs. Chest x-ray noted vascular congestion without edema.    Pertinent cardiac history: Normal stress test 09/2015. Normal LVEF on TTEs in 2017, 2019, and 2021. TTE 06/2021 with LVEF of 45-50%, moderate LVH, mild MR, and mild dilation of the aortic root. TTE 04/2022 with LVEF 55-60%, G2DD. LVEF unchanged on TTE 12/2023.   Pertinent Lab Values: Creat  Date Value Ref Range Status  08/12/2015 1.40 (H) 0.70 - 1.18 mg/dL Final   Creatinine, Ser  Date Value Ref Range Status  01/02/2024 1.29 (H) 0.61 - 1.24 mg/dL Final   BUN  Date Value Ref Range Status  01/02/2024 18 8 - 23 mg/dL Final  90/90/7974 27 8 - 27 mg/dL Final   Potassium  Date Value Ref Range Status  01/02/2024 3.5 3.5 - 5.1 mmol/L Final   Sodium  Date Value Ref Range Status  01/02/2024 138 135 - 145 mmol/L Final  12/26/2023 140 134 - 144 mmol/L Final   Brain Natriuretic Peptide  Date Value Ref Range Status  08/12/2015 117.3 (H) <100 pg/mL Final    Comment:      BNP levels increase with age in the general population with the highest values seen in individuals greater than 35 years of age. Reference: JINNY [employer] Coulter Cardiol 2002; 59:023-17.      B Natriuretic Peptide  Date Value Ref Range Status  01/01/2024 523.8 (H) 0.0 - 100.0 pg/mL Final    Comment:    Performed at [facility], [address] [place], [place] [address]   Magnesium   Date Value Ref Range Status  01/01/2024 2.2 1.7 - 2.4 mg/dL Final    Comment:    Performed at [facility], [address] [place], [place] [address]   Hemoglobin A1C  Date Value Ref Range Status  10/11/2018 5.3  Final   Hgb A1c MFr Bld  Date Value Ref Range Status  03/27/2023 5.6 4.8 - 5.6 % Final    Comment:             Prediabetes: 5.7 - 6.4          Diabetes: >6.4          Glycemic control for adults with diabetes: <7.0    Digoxin  Level  Date Value Ref Range Status  07/01/2021 1.1 0.8 - 2.0 ng/mL Final    Comment:    Performed at [facility], [address] [place], [place] [address]   TSH  Date Value Ref Range Status  12/21/2023 4.023 0.350 - 4.500 uIU/mL Final    Comment:    Performed by a 3rd Generation assay with a functional sensitivity of <=0.01 uIU/mL. Performed at [facility], [address] [place], [place] [address]   01/20/2015 1.88 0.35 - 4.50 uIU/mL Final    Vital Signs:  Temp:  [97.6 F (36.4 C)-98.5 F (36.9 C)] 98.2 F (36.8 C) (09/16 0151) Pulse Rate:  [69-86] 76 (09/16 0151) Cardiac Rhythm: Atrial fibrillation (09/15 2038) Resp:  [13-23] 20 (09/16 0151) BP: (94-131)/(66-105) 121/92 (09/16 0151) SpO2:  [95 %-98 %] 95 % (09/16 0151) Weight:  [97.4 kg (214 lb  11.7 oz)-97.9 kg (215 lb 13.3 oz)] 97.4 kg (214 lb 11.7 oz) (09/16 0500)  Intake/Output Summary (Last 24 hours) at 01/02/2024 0836 Last data filed at 01/02/2024 0500 Gross per 24 hour  Intake 480 ml  Output 400 ml  Net 80 ml    Current Heart Failure Medications:  Loop diuretic: none Beta-Blocker: metoprolol  tartrate 75 mg BID ACEI/ARB/ARNI: none MRA: none SGLT2i: none Other: none  Prior to admission Heart Failure Medications:  Loop diuretic: Beta-Blocker: metoprolol  tartrate 75 mg BID ACEI/ARB/ARNI: lisinopril  10 mg daily MRA: none SGLT2i: none Other: diltiazem  ER 120 mg daily, hydrochlorothiazide  12.5 mg daily, amlodipine  5 mg daily  Assessment: 1. Acute on chronic diastolic heart failure (LVEF 55-60%) with G2DD, due to NICM. NYHA class II-III symptoms.  -Symptoms: Reports  dyspnea on exertion and LEE at baseline. No other complaints. -Volume: LEE noted. Difficult to assess volume. Creatinine bumped after 2 doses of furosemide .  -Hemodynamics: BP is elevated. Robust antihypertensive regimen at home. Would recommend discontinuing amlodipine  and hydrochlorothiazide  in favor of GDMT. HR elevated, cardiology planning DCCV. -SGLT2i: Consider starting SGLT2i for first line therapy for HFpEF. Will check cost.  -MRA: Consider adding spironolactone 25 mg daily. K low, BP up, and will need gentle diuresis to replace hydrochlorothiazide . -ARNI: Can consider adding pending BP after spironolactone. -Currently on metoprolol  tartrate for rate control in the setting of AF as there is no benefit fro HFpEF.  Plan: 1) Medication changes recommended at this time: -Consider adding spironolactone 25 mg daily -Consider SGLT2i pending cost.  2) Patient assistance: -Pending  3) Education: - Patient has been educated on current HF medications and potential additions to HF medication regimen - Patient verbalizes understanding that over the next few months, these medication doses may change and more medications may be added to optimize HF regimen - Patient has been educated on basic disease state pathophysiology and goals of therapy  Medication Assistance / Insurance Benefits Check: Does the patient have prescription insurance?    Type of insurance plan:  Does the patient qualify for medication assistance through manufacturers or grants? Pending   Outpatient Pharmacy: Prior to admission outpatient pharmacy: [facility]      Please do not hesitate to reach out with questions or concerns,  Jaun Bash, PharmD, CPP, BCPS, [facility] Heart Failure Pharmacist  Phone - [phone] 01/02/2024 11:09 AM

## 2024-01-02 NOTE — Progress Notes (Signed)
 Heart Failure Nurse Navigator Progress Note  PCP: Myrla Jon [name], MD PCP-Cardiologist: Evalene Lunger, MD Admission Diagnosis: Atrial fibrillation with rapid ventricular response (HCC) Congestive heart failure, unspecified HF chronicity, unspecified heart failure type [facility]) Chest pain, unspecified type Admitted from: Home  Presentation:   Cameron Foster. 83 y.o. male who presented with chest pain getting progressively worse.  Tremors in hands. Denies shortness of breath, nausea or vomiting.Hx: A-fib, ascending aortic aneurysm, prior GI bleed, prior subarachnoid hemorrhage, basal cell carcinoma, hyperlipidemia, hypertension, depression, anxiety, CHF.  Patient had gained 5 pounds over the past 2 days and worsening lower extremity edema (right leg swelling worse than left) not on any current diuretics.  Recent admission for A-fib RVR with normal EF and no wall motion abnormality. CRT-1.37, BNP 523.8, HS-Troponin 14, Chest x-ray: Vascular congestion without edema.  ECHO/ LVEF: 12/22/23- 55-60%  Clinical Course:  Past Medical History:  Diagnosis Date   Alcoholism (HCC)    Allergy    Aortic atherosclerosis (HCC)    Arthritis    Ascending aorta dilation (HCC) 03/28/2018   a.) Vascular US : prox asc Ao measured 29 mm. b.)  CT CAP 06/22/2021: Ao root 41 mm. c.) TTE 06/26/2021: Ao root 41 mm; asc Ao 39 mm   Atrial fibrillation (HCC) 1995   a.) single episode in 1995 per patient; no long term treatment. b.) recurrent episode in the setting of GI bleeding related to colitis 06/2021.   Basal cell carcinoma 04/26/2017   Right medial cheek. Superficial and nodular   Basal cell carcinoma 06/11/2019   Left anterior shoulder. Nodular pattern   Basal cell carcinoma 09/09/2019   Right nasal ala, EDC   Basal cell carcinoma 02/05/2020   L upper eyebrow, EDC    Basal cell carcinoma 10/12/2022   left post auricular, EDC   Basal cell carcinoma 05/02/2023   Right medial cheek, EDC   Bilateral  carotid artery stenosis 06/09/2019   a.) Carotid doppler: mod; <50% BILATERAL ICAs.   Cataract    Chicken pox    Colon cancer (HCC)    Colon polyp    Depression    Son died 2015/01/20   Diverticulitis    Diverticulosis 30 years   Gastritis    GERD (gastroesophageal reflux disease)    H. pylori infection    History of stress test    a. 09/2015 MV: No ischemia/infarct. EF 45-54% (nl by echo).   Hyperlipidemia    Hypertension    Irritable bowel syndrome    Lacunar infarction (HCC) 06/08/2019   a.) small; RIGHT motor strip   Neuromuscular disorder (HCC)    NSVT (nonsustained ventricular tachycardia) (HCC)    a.)  Single episode lasting 5 beats at a maximum rate of 160 bpm noted on Holter study performed 08/13/2021.   Prostate cancer [facility]) January 20, 2011   a.) s/p XRT   PSVT (paroxysmal supraventricular tachycardia) (HCC)    a. 06/2019 Zio: Avg rate 74 (54-120), occas PACs, rare PVCs, 125 episodes of PVCs (longest 17.5 secs; max rate 187). No afib.   RBBB    SAH (subarachnoid hemorrhage) (HCC) 10/10/2018   Sleep apnea    Squamous cell carcinoma of skin 07/18/2017   Left medial calf. KA type   Squamous cell carcinoma of skin 04/24/2018   Right above med. brow   Squamous cell carcinoma of skin 06/11/2019   Right posterior shoulder. SCCis, hypertrophic   Squamous cell carcinoma of skin 01/09/2020   Mid nasal dorsum, MOHS, Efudex x 4wks  Substance abuse (HCC)    Systolic dysfunction    a.) TTE 10/05/2015: EF 50-55%; mild LVH; LAE; triv AR, mild MR. b.) TTE 03/29/2018: EF 55-60%; LAE, mild AR; ? small PFO. c.) TTE 06/09/2019: EF 55-60%, no rwma, triv MR/AI. d.) TTE 06/26/2021: EF 45-50%; glob HK; LAE; mild MR; Ao root 41 mm; asc Ao 39 mm.   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  Wine, Beer or Liquor.   Drug use: No   Sexual activity: Not Currently    Birth control/protection: None    Comment: Married  Other Topics Concern   Not on file  Social History Narrative   1 son deceased, 1 daughter living   Social Drivers of Health   Financial Resource Strain: Low Risk  (01/02/2024)   Overall Financial Resource Strain (CARDIA)    Difficulty of Paying Living Expenses: Not very hard  Food Insecurity: Unknown (01/02/2024)   Hunger Vital Sign    Worried About Running Out of Food in the Last Year: Not on file    Ran Out of Food in the Last Year: Never true  Transportation Needs: No Transportation Needs (01/02/2024)   PRAPARE - Administrator, Civil Service (Medical): No    Lack of Transportation (Non-Medical): No  Physical Activity: Inactive (11/24/2023)   Exercise Vital Sign    Days of Exercise per Week: 0 days    Minutes of Exercise per Session: Not on file  Stress: Stress Concern Present (11/24/2023)   [employer] of Occupational Health - Occupational Stress Questionnaire    Feeling of Stress: To some extent  Social Connections: Moderately Integrated (01/01/2024)   Social Connection and Isolation Panel    Frequency of Communication with Friends and Family: Three times a week    Frequency of Social Gatherings with Friends and Family: Three times a week    Attends Religious Services: More than 4 times per year    Active Member of Clubs or Organizations: Yes    Attends  Banker Meetings: More than 4 times per year    Marital Status: Widowed   Education Assessment and Provision:  Detailed education and instructions provided on heart failure disease management including the following:  Signs and symptoms of Heart Failure When to call the physician Importance of daily weights Low sodium diet Fluid restriction Medication management Anticipated future follow-up appointments  Patient education given on each of the above topics.  Patient acknowledges understanding via teach back method and acceptance of all instructions.  Education Materials:  Living Better With Heart Failure Booklet, HF zone tool, & Daily Weight Tracker Tool.  Patient has  scale at home: Yes Patient has pill box at home: Yes    High Risk Criteria for Readmission and/or Poor Patient Outcomes: Heart failure hospital admissions (last 6 months): 1  No Show rate: 3% Difficult social situation: Widowed-lives by himself. Utilizes Meals on [employer].  Consumes 3 alcoholic drinks per day. Demonstrates medication adherence: Yes Primary Language: English Literacy level: Reading, Writing & Comprehension  Barriers of Care:   ETOH use-3 drinks a day Currently relies on Meals on Wheels for food  Considerations/Referrals:   Referral made to Heart Failure Pharmacist Stewardship: Yes Referral made to Heart Failure CSW/NCM TOC: No Referral made to Heart & Vascular TOC clinic: Yes. [facility] TOC AHF Clinic 01/09/24 @ 2:30 PM  Items for Follow-up on DC/TOC: Diet & Fluid Restrictions Daily Weights ETOH abuse Continued Heart Failure Education  Charmaine Pines, RN, BSN [facility] Heart Failure Navigator Secure Chat Only

## 2024-01-02 NOTE — Telephone Encounter (Signed)
 Patient Product/process development scientist completed.    The patient is insured through [employer]. Patient has Medicare and is not eligible for a copay card, but may be able to apply for patient assistance or Medicare RX Payment Plan (Patient Must reach out to their plan, if eligible for payment plan), if available.    Ran test claim for sacubitril-valsartan (Entresto) 24-26 mg and the current 30 day co-pay is $101.57 due to a $590.00 deductible.  Ran test claim for Farxiga 10 mg and the current 30 day co-pay is $264.72 due to a $590.00 deductible.  Ran test claim for Jardiance 10 mg and the current 30 day co-pay is $271.65 due to a $590.00 deductible.  This test claim was processed through [facility]- copay amounts may vary at other pharmacies due to pharmacy/plan contracts, or as the patient moves through the different stages of their insurance plan.     Reyes Sharps, CPHT Pharmacy Technician III Certified Patient Advocate [facility] Patient Advocate Team Direct Number: [phone]  Fax: [phone]

## 2024-01-03 ENCOUNTER — Inpatient Hospital Stay (HOSPITAL_COMMUNITY)
Admit: 2024-01-03 | Discharge: 2024-01-03 | Disposition: A | Discharge status: Home / Self Care | Attending: Physician Assistant | Admitting: Physician Assistant

## 2024-01-03 ENCOUNTER — Inpatient Hospital Stay: Admitting: Certified Registered"

## 2024-01-03 ENCOUNTER — Encounter: Payer: Self-pay | Admitting: Internal Medicine

## 2024-01-03 ENCOUNTER — Ambulatory Visit: Admitting: Student

## 2024-01-03 ENCOUNTER — Encounter
Admission: EM | Disposition: A | Discharge status: Home Health | Payer: Self-pay | Source: Home / Self Care | Attending: Family Medicine

## 2024-01-03 DIAGNOSIS — Z8719 Personal history of other diseases of the digestive system: Secondary | ICD-10-CM

## 2024-01-03 DIAGNOSIS — I340 Nonrheumatic mitral (valve) insufficiency: Secondary | ICD-10-CM

## 2024-01-03 DIAGNOSIS — I4891 Unspecified atrial fibrillation: Secondary | ICD-10-CM | POA: Diagnosis not present

## 2024-01-03 DIAGNOSIS — I420 Dilated cardiomyopathy: Secondary | ICD-10-CM

## 2024-01-03 DIAGNOSIS — R5381 Other malaise: Secondary | ICD-10-CM

## 2024-01-03 DIAGNOSIS — I509 Heart failure, unspecified: Secondary | ICD-10-CM

## 2024-01-03 DIAGNOSIS — I5030 Unspecified diastolic (congestive) heart failure: Secondary | ICD-10-CM | POA: Diagnosis not present

## 2024-01-03 DIAGNOSIS — R079 Chest pain, unspecified: Secondary | ICD-10-CM | POA: Diagnosis not present

## 2024-01-03 HISTORY — PX: TEE WITHOUT CARDIOVERSION: SHX5443

## 2024-01-03 HISTORY — PX: CARDIOVERSION: SHX1299

## 2024-01-03 LAB — BASIC METABOLIC PANEL WITH GFR
Anion gap: 9 (ref 5–15)
BUN: 17 mg/dL (ref 8–23)
CO2: 26 mmol/L (ref 22–32)
Calcium: 9 mg/dL (ref 8.9–10.3)
Chloride: 103 mmol/L (ref 98–111)
Creatinine, Ser: 1.37 mg/dL — ABNORMAL HIGH (ref 0.61–1.24)
GFR, Estimated: 51 mL/min — ABNORMAL LOW (ref >60)
Glucose, Bld: 106 mg/dL — ABNORMAL HIGH (ref 70–99)
Potassium: 4.3 mmol/L (ref 3.5–5.1)
Sodium: 138 mmol/L (ref 135–145)

## 2024-01-03 LAB — CBC WITH DIFFERENTIAL/PLATELET
Abs Immature Granulocytes: 0.03 K/uL (ref 0.00–0.07)
Basophils Absolute: 0 K/uL (ref 0.0–0.1)
Basophils Relative: 1 %
Eosinophils Absolute: 0.3 K/uL (ref 0.0–0.5)
Eosinophils Relative: 4 %
HCT: 34.8 % — ABNORMAL LOW (ref 39.0–52.0)
Hemoglobin: 11.5 g/dL — ABNORMAL LOW (ref 13.0–17.0)
Immature Granulocytes: 0 %
Lymphocytes Relative: 16 %
Lymphs Abs: 1.2 K/uL (ref 0.7–4.0)
MCH: 30.3 pg (ref 26.0–34.0)
MCHC: 33 g/dL (ref 30.0–36.0)
MCV: 91.8 fL (ref 80.0–100.0)
Monocytes Absolute: 0.7 K/uL (ref 0.1–1.0)
Monocytes Relative: 9 %
Neutro Abs: 5.6 K/uL (ref 1.7–7.7)
Neutrophils Relative %: 70 %
Platelets: 227 K/uL (ref 150–400)
RBC: 3.79 MIL/uL — ABNORMAL LOW (ref 4.22–5.81)
RDW: 12.8 % (ref 11.5–15.5)
WBC: 7.9 K/uL (ref 4.0–10.5)
nRBC: 0 % (ref 0.0–0.2)

## 2024-01-03 LAB — GLUCOSE, CAPILLARY: Glucose-Capillary: 92 mg/dL (ref 70–99)

## 2024-01-03 LAB — ECHO TEE

## 2024-01-03 SURGERY — ECHOCARDIOGRAM, TRANSESOPHAGEAL
Anesthesia: General

## 2024-01-03 MED ORDER — IPRATROPIUM-ALBUTEROL 0.5-2.5 (3) MG/3ML IN SOLN
3.0000 mL | RESPIRATORY_TRACT | Status: DC | PRN
  Administered 2024-01-04: 3 mL via RESPIRATORY_TRACT
  Filled 2024-01-03: qty 3

## 2024-01-03 MED ORDER — IPRATROPIUM-ALBUTEROL 0.5-2.5 (3) MG/3ML IN SOLN
3.0000 mL | Freq: Four times a day (QID) | RESPIRATORY_TRACT | Status: DC
  Administered 2024-01-03: 3 mL via RESPIRATORY_TRACT
  Filled 2024-01-03: qty 3

## 2024-01-03 MED ORDER — PROPOFOL 1000 MG/100ML IV EMUL
INTRAVENOUS | Status: AC
  Filled 2024-01-03: qty 100

## 2024-01-03 MED ORDER — IPRATROPIUM-ALBUTEROL 0.5-2.5 (3) MG/3ML IN SOLN
3.0000 mL | Freq: Two times a day (BID) | RESPIRATORY_TRACT | Status: DC
Start: 2024-01-03 — End: 2024-01-04
  Administered 2024-01-03: 3 mL via RESPIRATORY_TRACT
  Filled 2024-01-03: qty 3

## 2024-01-03 MED ORDER — DILTIAZEM HCL 30 MG PO TABS
30.0000 mg | ORAL_TABLET | Freq: Four times a day (QID) | ORAL | Status: DC
  Administered 2024-01-03: 30 mg via ORAL
  Filled 2024-01-03 (×2): qty 1

## 2024-01-03 MED ORDER — BUTAMBEN-TETRACAINE-BENZOCAINE 2-2-14 % EX AERO
INHALATION_SPRAY | CUTANEOUS | Status: AC
Start: 2024-01-03 — End: 2024-01-03
  Filled 2024-01-03: qty 5

## 2024-01-03 MED ORDER — PROPOFOL 10 MG/ML IV BOLUS
INTRAVENOUS | Status: AC
  Filled 2024-01-03: qty 20

## 2024-01-03 MED ORDER — PHENYLEPHRINE 80 MCG/ML (10ML) SYRINGE FOR IV PUSH (FOR BLOOD PRESSURE SUPPORT)
PREFILLED_SYRINGE | INTRAVENOUS | Status: DC | PRN
Start: 2024-01-03 — End: 2024-01-03
  Administered 2024-01-03: 80 ug via INTRAVENOUS

## 2024-01-03 MED ORDER — LIDOCAINE VISCOUS HCL 2 % MT SOLN
OROMUCOSAL | Status: AC
  Filled 2024-01-03: qty 15

## 2024-01-03 MED ORDER — PROPOFOL 10 MG/ML IV BOLUS
INTRAVENOUS | Status: DC | PRN
  Administered 2024-01-03: 20 mg via INTRAVENOUS
  Administered 2024-01-03: 30 mg via INTRAVENOUS
  Administered 2024-01-03: 10 mg via INTRAVENOUS
  Administered 2024-01-03 (×2): 20 mg via INTRAVENOUS

## 2024-01-03 MED ORDER — SODIUM CHLORIDE 0.9 % IV SOLN: INTRAVENOUS | Status: DC

## 2024-01-03 MED ORDER — GUAIFENESIN ER 600 MG PO TB12
600.0000 mg | ORAL_TABLET | Freq: Two times a day (BID) | ORAL | Status: DC
  Administered 2024-01-03 – 2024-01-04 (×3): 600 mg via ORAL
  Filled 2024-01-03 (×4): qty 1

## 2024-01-03 MED ORDER — EPHEDRINE SULFATE (PRESSORS) 50 MG/ML IJ SOLN
INTRAMUSCULAR | Status: DC | PRN
Start: 2024-01-03 — End: 2024-01-03
  Administered 2024-01-03 (×2): 5 mg via INTRAVENOUS

## 2024-01-03 MED ORDER — LIDOCAINE HCL (CARDIAC) PF 100 MG/5ML IV SOSY
PREFILLED_SYRINGE | INTRAVENOUS | Status: DC | PRN
  Administered 2024-01-03: 40 mg via INTRAVENOUS

## 2024-01-03 MED ORDER — AMIODARONE HCL 200 MG PO TABS
400.0000 mg | ORAL_TABLET | Freq: Two times a day (BID) | ORAL | Status: DC
  Administered 2024-01-03 – 2024-01-04 (×3): 400 mg via ORAL
  Filled 2024-01-03 (×4): qty 2

## 2024-01-03 MED ORDER — DEXMEDETOMIDINE HCL IN NACL 80 MCG/20ML IV SOLN
INTRAVENOUS | Status: DC | PRN
  Administered 2024-01-03: 4 ug via INTRAVENOUS

## 2024-01-03 NOTE — Progress Notes (Signed)
 Heart Failure Stewardship Pharmacy Note  PCP: Myrla Jon [name], MD PCP-Cardiologist: Lonni Hanson, MD  HPI: Cameron Foster. is a 83 y.o. male with paroxysmal A-fib, ascending aortic aneurysm, prior GI bleed, prior subarachnoid hemorrhage, CVA, alcohol use, basal cell carcinoma, hyperlipidemia, hypertension, depression, anxiety, diastolic CHF who presented with chest pain. On admission, BNP was 523.8, HS-troponin was 14, and normal LFTs. Chest x-ray noted vascular congestion without edema.    Pertinent cardiac history: Normal stress test 09/2015. Normal LVEF on TTEs in 2017, 2019, and 2021. TTE 06/2021 with LVEF of 45-50%, moderate LVH, mild MR, and mild dilation of the aortic root. TTE 04/2022 with LVEF 55-60%, G2DD. LVEF unchanged on TTE 12/2023.   Pertinent Lab Values: Creat  Date Value Ref Range Status  08/12/2015 1.40 (H) 0.70 - 1.18 mg/dL Final   Creatinine, Ser  Date Value Ref Range Status  01/03/2024 1.37 (H) 0.61 - 1.24 mg/dL Final   BUN  Date Value Ref Range Status  01/03/2024 17 8 - 23 mg/dL Final  90/90/7974 27 8 - 27 mg/dL Final   Potassium  Date Value Ref Range Status  01/03/2024 4.3 3.5 - 5.1 mmol/L Final   Sodium  Date Value Ref Range Status  01/03/2024 138 135 - 145 mmol/L Final  12/26/2023 140 134 - 144 mmol/L Final   Brain Natriuretic Peptide  Date Value Ref Range Status  08/12/2015 117.3 (H) <100 pg/mL Final    Comment:      BNP levels increase with age in the general population with the highest values seen in individuals greater than 8 years of age. Reference: JINNY [employer] Coulter Cardiol 2002; 59:023-17.      B Natriuretic Peptide  Date Value Ref Range Status  01/01/2024 523.8 (H) 0.0 - 100.0 pg/mL Final    Comment:    Performed at [facility], [address] [place], [place] [address]   Magnesium   Date Value Ref Range Status  01/01/2024 2.2 1.7 - 2.4 mg/dL Final    Comment:    Performed at [facility], [address] [place], [place] [address]   Hemoglobin A1C  Date Value Ref Range Status  10/11/2018 5.3  Final   Hgb A1c MFr Bld  Date Value Ref Range Status  03/27/2023 5.6 4.8 - 5.6 % Final    Comment:             Prediabetes: 5.7 - 6.4          Diabetes: >6.4          Glycemic control for adults with diabetes: <7.0    Digoxin  Level  Date Value Ref Range Status  07/01/2021 1.1 0.8 - 2.0 ng/mL Final    Comment:    Performed at [facility], [address] [place], [place] [address]   TSH  Date Value Ref Range Status  12/21/2023 4.023 0.350 - 4.500 uIU/mL Final    Comment:    Performed by a 3rd Generation assay with a functional sensitivity of <=0.01 uIU/mL. Performed at [facility], [name] [place], [place] [address]   01/20/2015 1.88 0.35 - 4.50 uIU/mL Final    Vital Signs:  Temp:  [97.8 F (36.6 C)-98.6 F (37 C)] 98.6 F (37 C) (09/17 0357) Pulse Rate:  [72-101] 101 (09/17 0528) Cardiac Rhythm: Atrial fibrillation;Bundle branch block (09/17 0712) Resp:  [16-20] 20 (09/17 0357) BP: (126-144)/(86-110) 127/86 (09/17 0528) SpO2:  [96 %-97 %] 96 % (09/17 0357) Weight:  [95.1 kg (  209 lb 10.5 oz)] 95.1 kg (209 lb 10.5 oz) (09/17 0410)  Intake/Output Summary (Last 24 hours) at 01/03/2024 0728 Last data filed at 01/03/2024 0411 Gross per 24 hour  Intake 720 ml  Output 1800 ml  Net -1080 ml    Current Heart Failure Medications:  Loop diuretic: none Beta-Blocker: metoprolol  tartrate 75 mg BID ACEI/ARB/ARNI: none MRA: none SGLT2i: none Other: diltiazem  30 mg PO q6h  Prior to admission Heart Failure Medications:  Loop diuretic: none Beta-Blocker: metoprolol  tartrate 75 mg BID ACEI/ARB/ARNI: lisinopril  10 mg daily MRA: none SGLT2i: none Other: diltiazem  ER 120 mg daily, hydrochlorothiazide  12.5 mg daily, amlodipine  5 mg daily  Assessment: 1. Acute on chronic diastolic heart failure (LVEF 55-60%) with G2DD, due to NICM. NYHA class II-III  symptoms.  -Symptoms: Reports dyspnea on exertion and LEE at baseline. No other complaints. -Volume: LEE noted. Difficult to assess volume. Creatinine stable off diuretics. -Hemodynamics: BP is elevated. Robust antihypertensive regimen at home. Would recommend discontinuing amlodipine  and hydrochlorothiazide  in favor of GDMT at discharge. HR elevated, cardiology planning DCCV today. -SGLT2i: Consider starting SGLT2i for first line therapy for HFpEF. High deductible, but would likely qualify for a grant. -MRA: Consider adding spironolactone 25 mg daily after DCCV. Will need gentle diuresis to replace hydrochlorothiazide . -ARNI: Can consider adding pending BP after spironolactone. -Currently on metoprolol  tartrate for rate control in the setting of AF as there is no benefit fro HFpEF. May require adjustment of diltiazem  and metoprolol  post DCCV pending rates.  Plan: 1) Medication changes recommended at this time: -Consider adding spironolactone 25 mg daily after DCCV -Consider starting SGLT2i after DCCV, can apply for healthwell grant if initiated.  2) Patient assistance: -Copays are high due to $590 deductible  3) Education: - Patient has been educated on current HF medications and potential additions to HF medication regimen - Patient verbalizes understanding that over the next few months, these medication doses may change and more medications may be added to optimize HF regimen - Patient has been educated on basic disease state pathophysiology and goals of therapy  Medication Assistance / Insurance Benefits Check: Does the patient have prescription insurance?    Type of insurance plan:  Does the patient qualify for medication assistance through manufacturers or grants? Pending   Outpatient Pharmacy: Prior to admission outpatient pharmacy: [facility]      Please do not hesitate to reach out with questions or concerns,  Jaun Bash, PharmD, CPP, BCPS, [facility] Heart Failure Pharmacist  Phone  - [phone] 01/03/2024 7:28 AM

## 2024-01-03 NOTE — Progress Notes (Signed)
 Transesophageal Echocardiogram :  Indication: Atrial fibrillation with RVR, shortness of breath, cardiomyopathy Requesting/ordering  physician: Dr. Lorane Poland  Procedure: Benzocaine  spray x2 and 2 mls x 2 of viscous lidocaine  were given orally to provide local anesthesia to the oropharynx. The patient was positioned supine on the left side, bite block provided. The patient was moderately sedated with the doses of versed  and fentanyl  as detailed below.  Using digital technique an omniplane probe was advanced into the distal esophagus without incident.   Moderate sedation: 1. Sedation used: Sedation per anesthesia  See report in EPIC  for complete details: In brief, transgastric imaging revealed moderate to severely reduced LV function with global hypokinesis and no mural apical thrombus.  .  Estimated ejection fraction was 30%.  Right sided cardiac chambers showed moderately reduced RV function l with no evidence of pulmonary hypertension.  Imaging of the septum showed no ASD or VSD Bubble study positive for shunt, right-to-left 2D and color flow confirmed  PFO  The LA was well visualized in orthogonal views.  There was  spontaneous contrast,  no thrombus in the LA and LA appendage   The descending thoracic aorta had no  mural aortic debris with no evidence of aneurysmal dilation or disection Mild diffuse aortic atherosclerosis  Cardioversion for atrial fibrillation with RVR to follow   Cameron Foster 01/03/2024 1:23 PM

## 2024-01-03 NOTE — Transfer of Care (Signed)
 Immediate Anesthesia Transfer of Care Note  Patient: Cameron Foster.  Procedure(s) Performed: ECHOCARDIOGRAM, TRANSESOPHAGEAL CARDIOVERSION  Patient Location: PACU  Anesthesia Type:General  Level of Consciousness: awake, alert , oriented, and patient cooperative  Airway & Oxygen Therapy: Patient Spontanous Breathing and Patient connected to face mask oxygen  Post-op Assessment: Report given to RN, Post -op Vital signs reviewed and stable, and Patient moving all extremities X 4  Post vital signs: Reviewed and stable  Last Vitals:  Vitals Value Taken Time  BP 111/75 01/03/24 13:25  Temp    Pulse 61 01/03/24 13:25  Resp 17 01/03/24 13:25  SpO2 99 % 01/03/24 13:25  Vitals shown include unfiled device data.  Last Pain:  Vitals:   01/03/24 1224  TempSrc: Temporal  PainSc: 0-No pain         Complications: No notable events documented.

## 2024-01-03 NOTE — Progress Notes (Signed)
 PROGRESS NOTE    Cameron Foster.  FMW:[id number] DOB: November 02, 1940 DOA: 01/01/2024 PCP: Myrla Jon [name], MD  Chief Complaint  Patient presents with   Chest Pain    Hospital Course:  [phone] with h/o PAF on Eliquis , chronic HFrEF, diverticulitis s/p sigmoid colectomy, CVA, HTN, stage 3a CKD, ETOH use d/o, and tobacco use who presented on 9/15 with chest pain. He was previously hospitalized from 9/3-7 for afib with RVR and was found to have recurrence. He was given Lasix  and IV metoprolol . He is planned for TEE/DCCV on 9/17.  Subjective: This morning patient has no acute complaints.  He is ready to discharge home.  He is anxious to be discharged after TEE.  When discussing with his sister at bedside she endorses significant concerns over his ambulation.  He has not yet worked with physical therapy.   Objective: Vitals:   01/03/24 1324 01/03/24 1330 01/03/24 1335 01/03/24 1345  BP:  108/80 108/63 100/72  Pulse: 61 62 63 63  Resp: 17 19 17 20   Temp:   (!) 97.3 F (36.3 C)   TempSrc:   Temporal   SpO2: 99% 100% 95% 93%  Weight:      Height:        Intake/Output Summary (Last 24 hours) at 01/03/2024 1359 Last data filed at 01/03/2024 1324 Gross per 24 hour  Intake 580 ml  Output 2000 ml  Net -1420 ml   Filed Weights   01/01/24 1934 01/02/24 0500 01/03/24 0410  Weight: 97.9 kg 97.4 kg 95.1 kg    Examination: General exam: Appears calm and comfortable, NAD  Respiratory system: No work of breathing, symmetric chest wall expansion Cardiovascular system: S1 & S2 heard, RRR.  Gastrointestinal system: Abdomen is nondistended, soft and nontender.  Neuro: Alert and oriented. No focal neurological deficits. Extremities: Symmetric, expected ROM Skin: No rashes, lesions Psychiatry: Demonstrates appropriate judgement and insight. Mood & affect appropriate for situation.   Assessment & Plan:  Principal Problem:   Atrial fibrillation with RVR (HCC) Active Problems:   Acute on  chronic HFrEF (heart failure with reduced ejection fraction) (HCC)   Chest pain   History of subarachnoid hemorrhage   History of GI bleed   Stage 3a chronic kidney disease (HCC)   Shortness of breath   Debility   Atrial fibrillation with rapid ventricular response (HCC)   Congestive heart failure (HCC)   Alcohol use disorder   Dilated cardiomyopathy (HCC)    Atrial fibrillation with RVR - TEE/DCCV today.  Successful.  Now NSR - Will resume rate control per cardiology.  Currently diltiazem  and metoprolol . - Continue Eliquis  for now, does have bleeding risk as noted below.  Acute on chronic heart failure with reduced EF - Echo 12/26/2023: EF 55 to 60%.  Echo during procedure today reveals severely reduced LV function with global hypokinesis, no mural apical thrombus, estimated EF 30%.  Likely secondary to atrial fibrillation - GDMT as BP tolerates  Chest pain - Likely related to RVR - Troponins negative, EKG nonacute - No plans for ischemic evaluation at this time.  Cardiology aware  Sputum production, cough. - As needed DuoNebs. - Scheduled Mucinex  DM - Monitor closely for fever.  RVP if continues  Tobacco abuse - Counseled on cessation - Continue nicotine  patch  History of GI bleed History of sigmoid colectomy secondary diverticulosis - Currently on Eliquis .  Hemoglobin stable.  Monitor closely  History of subarachnoid hemorrhage - Stable presently.  Wean off of anticoagulation as soon as  able  Stage IIIa CKD - At baseline - Avoid nephrotoxic medications when able  Alcohol use disorder - Continue CIWA protocol - Cessation has been encouraged.  Likely contributing to A-fib.  Generalized weakness - Patient appears significantly debilitated.  PT evals pending.  May benefit from short rehab/SNF stay.  DVT prophylaxis: Eliquis    Code Status: Full Code Disposition: Pending PT/OT evals.  May need SNF  Consultants:  Treatment Team:  Consulting Physician: Darron Deatrice LABOR, MD  Procedures:  TEE/DCCV 9/17  Antimicrobials:  Anti-infectives (From admission, onward)    None       Data Reviewed: I have personally reviewed following labs and imaging studies CBC: Recent Labs  Lab 12/31/23 2026 01/01/24 2010 01/03/24 0430  WBC 10.3 7.7 7.9  NEUTROABS  --  5.6 5.6  HGB 11.4* 10.7* 11.5*  HCT 35.6* 33.5* 34.8*  MCV 93.9 93.8 91.8  PLT 224 186 227   Basic Metabolic Panel: Recent Labs  Lab 12/31/23 2026 01/01/24 0105 01/01/24 2010 01/02/24 0438 01/03/24 0430  NA 135  --  136 138 138  K 3.8  --  3.8 3.5 4.3  CL 104  --  101 103 103  CO2 23  --  26 27 26   GLUCOSE 126*  --  135* 106* 106*  BUN 19  --  21 18 17   CREATININE 1.37*  --  1.57* 1.29* 1.37*  CALCIUM  8.9  --  8.7* 8.7* 9.0  MG  --  2.2  --   --   --    GFR: Estimated Creatinine Clearance: 46.5 mL/min (A) (by C-G formula based on SCr of 1.37 mg/dL (H)). Liver Function Tests: Recent Labs  Lab 01/01/24 0105  AST 24  ALT 20  ALKPHOS 49  BILITOT 0.6  PROT 6.3*  ALBUMIN  3.2*   CBG: No results for input(s): GLUCAP in the last 168 hours.  No results found for this or any previous visit (from the past 240 hours).   Radiology Studies: No results found.  Scheduled Meds:  amiodarone   400 mg Oral BID   [MAR Hold] apixaban   5 mg Oral BID   [MAR Hold] atorvastatin   40 mg Oral Daily   [MAR Hold] docusate sodium   200 mg Oral Daily   [MAR Hold] fluticasone   1 spray Each Nare Daily   [MAR Hold] folic acid   1 mg Oral Daily   [MAR Hold] ipratropium-albuterol   3 mL Nebulization BID   [MAR Hold] metoprolol  tartrate  75 mg Oral BID   [MAR Hold] multivitamin with minerals  1 tablet Oral Daily   [MAR Hold] nicotine   21 mg Transdermal Daily   [MAR Hold] sertraline   100 mg Oral Daily   [MAR Hold] thiamine   100 mg Oral Daily   Or   [MAR Hold] thiamine   100 mg Intravenous Daily   Continuous Infusions:  sodium chloride        LOS: 1 day  MDM: Patient is high risk for one or  more organ failure.  They necessitate ongoing hospitalization for continued IV therapies and subsequent lab monitoring. Total time spent interpreting labs and vitals, reviewing the medical record, coordinating care amongst consultants and care team members, directly assessing and discussing care with the patient and/or family: 55 min  Skyler Dusing, DO [facility]  To contact the attending physician between 7A-7P please use Epic Chat. To contact the covering physician during after hours 7P-7A, please review Amion.  01/03/2024, 1:59 PM   *This document has been created with the  assistance of dictation software. Please excuse typographical errors. *

## 2024-01-03 NOTE — CV Procedure (Signed)
Cardioversion procedure note For atrial fibrillation with RVR  Procedure Details:  Consent: Risks of procedure as well as the alternatives and risks of each were explained to the (patient/caregiver). Consent for procedure obtained.  Time Out: Verified patient identification, verified procedure, site/side was marked, verified correct patient position, special equipment/implants available, medications/allergies/relevent history reviewed, required imaging and test results available. Performed  Patient placed on cardiac monitor, pulse oximetry, supplemental oxygen as necessary.  Sedation given: propofol IV, Dr. Rosey Bath Pacer pads placed anterior and posterior chest.   Cardioverted 1 time(s).  Cardioverted at  150 J. Synchronized biphasic Converted to NSR   Evaluation: Findings: Post procedure EKG shows: NSR Complications: None Patient did tolerate procedure well.  Time Spent Directly with the Patient:  24 minutes   Esmond Plants, M.D., Ph.D.

## 2024-01-03 NOTE — Progress Notes (Signed)
 Patient stable. Bedside handoff given to [employer].

## 2024-01-03 NOTE — Progress Notes (Signed)
 Rounding Note   Patient Name: Cameron Foster. Date of Encounter: 01/03/2024  [facility] Cardiologist: Lonni Hanson, MD   Subjective Patient reports productive cough this morning which is a chronic issue for him. He remains in atrial fibrillation with rate 100-130 bpm. Plan for TEE/DCCV later today. He is NPO this morning.   Scheduled Meds:  apixaban   5 mg Oral BID   atorvastatin   40 mg Oral Daily   docusate sodium   200 mg Oral Daily   fluticasone   1 spray Each Nare Daily   folic acid   1 mg Oral Daily   metoprolol  tartrate  75 mg Oral BID   multivitamin with minerals  1 tablet Oral Daily   nicotine   21 mg Transdermal Daily   sertraline   100 mg Oral Daily   thiamine   100 mg Oral Daily   Or   thiamine   100 mg Intravenous Daily   Continuous Infusions:  PRN Meds: acetaminophen  **OR** acetaminophen , LORazepam  **OR** LORazepam , ondansetron  **OR** ondansetron  (ZOFRAN ) IV   Vital Signs  Vitals:   01/03/24 0357 01/03/24 0410 01/03/24 0528 01/03/24 0719  BP: (!) 144/110  127/86 (!) 125/110  Pulse: 81  (!) 101 70  Resp: 20   18  Temp: 98.6 F (37 C)   98.1 F (36.7 C)  TempSrc: Oral   Oral  SpO2: 96%   94%  Weight:  95.1 kg    Height:        Intake/Output Summary (Last 24 hours) at 01/03/2024 0815 Last data filed at 01/03/2024 0700 Gross per 24 hour  Intake 720 ml  Output 2000 ml  Net -1280 ml      01/03/2024    4:10 AM 01/02/2024    5:00 AM 01/01/2024    7:34 PM  Last 3 Weights  Weight (lbs) 209 lb 10.5 oz 214 lb 11.7 oz 215 lb 13.3 oz  Weight (kg) 95.1 kg 97.4 kg 97.9 kg      Telemetry Atrial fibrillation rate 100 to 130 bpm- Personally Reviewed  Physical Exam  GEN: No acute distress.   Neck: No JVD Cardiac: IRIR, tachycardic, no murmurs, rubs, or gallops.  Respiratory: Expiratory wheezing with faint crackles GI: Soft, nontender, non-distended  MS: Trace lower extremity edema, right worse than left; No deformity. Neuro:  Nonfocal  Psych:  Normal affect   Labs High Sensitivity Troponin:   Recent Labs  Lab 12/20/23 2251 12/21/23 0050 12/31/23 2026 01/01/24 0105  TROPONINIHS 21* 50* 14 13     Chemistry Recent Labs  Lab 01/01/24 0105 01/01/24 2010 01/02/24 0438 01/03/24 0430  NA  --  136 138 138  K  --  3.8 3.5 4.3  CL  --  101 103 103  CO2  --  26 27 26   GLUCOSE  --  135* 106* 106*  BUN  --  21 18 17   CREATININE  --  1.57* 1.29* 1.37*  CALCIUM   --  8.7* 8.7* 9.0  MG 2.2  --   --   --   PROT 6.3*  --   --   --   ALBUMIN  3.2*  --   --   --   AST 24  --   --   --   ALT 20  --   --   --   ALKPHOS 49  --   --   --   BILITOT 0.6  --   --   --   GFRNONAA  --  43* 55* 51*  ANIONGAP  --  9 8 9     Lipids No results for input(s): CHOL, TRIG, HDL, LABVLDL, LDLCALC, CHOLHDL in the last 168 hours.  Hematology Recent Labs  Lab 12/31/23 2026 01/01/24 2010 01/03/24 0430  WBC 10.3 7.7 7.9  RBC 3.79* 3.57* 3.79*  HGB 11.4* 10.7* 11.5*  HCT 35.6* 33.5* 34.8*  MCV 93.9 93.8 91.8  MCH 30.1 30.0 30.3  MCHC 32.0 31.9 33.0  RDW 12.8 12.7 12.8  PLT 224 186 227   Thyroid  No results for input(s): TSH, FREET4 in the last 168 hours.  BNP Recent Labs  Lab 01/01/24 0105  BNP 523.8*    DDimer No results for input(s): DDIMER in the last 168 hours.   Radiology  CT Angio Chest PE W and/or Wo Contrast Result Date: 01/01/2024 IMPRESSION: 1. No pulmonary embolism. 2. Stable fusiform dilation of the thoracic aorta measuring 4.3 cm at the level of the sinuses of Valsalva, 4.1 cm in its ascending segment, and 3.8 cm in its proximal descending segment. Recommend annual imaging follow-up by CTA or MRA. 3. Progressive mild mediastinal adenopathy with multiple right paratracheal, aortopulmonary, and subsequent lymph nodes measuring up to 12-13 mm in short axis diameter. This is nonspecific, but may be reactive, inflammatory, or lymphoproliferative in etiology. Follow-up CT or PET CT examination would be helpful in 3  months to demonstrate stability or resolution. 4. Mosaic attenuation of the pulmonary parenchyma in keeping with air trapping related to small airways disease, with superimposed extensive bronchial wall thickening in keeping with airway inflammation. Bronchomalacia involving the right bronchus intermedius noted. Electronically signed by: Dorethia Molt MD 01/01/2024 02:39 AM EDT RP Workstation: [id number]   US  Venous Img Lower Unilateral Right Result Date: 01/01/2024 IMPRESSION: 1. No evidence of deep venous thrombosis. 2. Diffuse soft tissue/interstitial edema within the subcutaneous soft tissues of the right lower leg. Electronically Signed   By: Morene Hoard M.D.   On: 01/01/2024 02:29   DG Chest 2 View Result Date: 12/31/2023 IMPRESSION: Vascular congestion without edema. Electronically Signed   By: Oneil Devonshire M.D.   On: 12/31/2023 20:46    Cardiac Studies  12/22/2023 Echo complete  1. Left ventricular ejection fraction, by estimation, is 55 to 60%. Left ventricular ejection fraction by PLAX is 66 %. The left ventricle has normal function. The left ventricle has no regional wall motion abnormalities. There is mild left ventricular hypertrophy. Left ventricular diastolic parameters are indeterminate.   2. Right ventricular systolic function is normal. The right ventricular size is normal.   3. The mitral valve is normal in structure. No evidence of mitral valve regurgitation.   4. The aortic valve was not well visualized. Aortic valve regurgitation is not visualized.   Patient Profile   83 y.o. male  with a hx of  Paroxysmal atrial fibrillation on Eliquis , HFimpEF (EF 55-60% 12/22/2023), diverticulitis s/p sigmoid colectomy, GI bleed, lacunar CVA, HTN, and CKD stage IIIa who was admitted 9/15 with atrial fibrillation with RVR and associated chest discomfort.   Assessment & Plan   Atrial fibrillation with RVR - Recent hospitalization earlier this month for atrial fibrillation with RVR.   Discharged with rate well-controlled on metoprolol  and diltiazem .  Started on Eliquis  with plan for DCCV after 3 weeks of anticoagulation. - Patient presented 9/14 with chest discomfort and found to be in atrial fibrillation with RVR, up to 130s bpm - Remains in atrial fibrillation with rates 100 to 130 bpm - Continue metoprolol  tartrate 75 mg twice daily - Will  add small dose diltiazem  every 6 hours for further rate control - Given rates have been more difficult to control, patient is scheduled for TEE/DCCV later today.  He should remain n.p.o. this morning.  - Continue Eliquis  5 mg twice daily for CHA2DS2-VASc of 7  Chest pain - Largely atypical in nature - Troponin negative x 2 - EKG without acute ischemic changes - Suspect this is likely secondary to RVR, pain largely resolved with rate control - No indication for ischemic evaluation at this time  HFimpEF Hypertension - Most recent echo 12/22/2023 with EF 55 to 60%.  History of G2 DD, although could not be assessed on most recent echo. - BNP 523 - Received IV Lasix  20 mg x 2 in the ED with good UOP - Appears euvolemic on exam, although difficult to assess due to body habitus - Faint crackles on exam yesterday. Given IV Lasix  20 mg with -1.2 L output. Consider another dose today if renal function remains stable.  - Continue metoprolol  as above  Hx GI bleed Hx sigmoid colectomy due to diverticulosis - Stable without symptoms at this time - Continue to monitor with anticoagulation - Consider EP referral for left atrial appendage occlusion   Stage IIIa CKD - Renal function appears near baseline  Alcohol use disorder - Recommend cessation, likely contributing to atrial fibrillation  Informed Consent   Shared Decision Making/Informed Consent   The risks [stroke, cardiac arrhythmias rarely resulting in the need for a temporary or permanent pacemaker, skin irritation or burns, esophageal damage, perforation (1:10,000 risk), bleeding,  pharyngeal hematoma as well as other potential complications associated with conscious sedation including aspiration, arrhythmia, respiratory failure and death], benefits (treatment guidance, restoration of normal sinus rhythm, diagnostic support) and alternatives of a transesophageal echocardiogram guided cardioversion were discussed in detail with Cameron Foster and he is willing to proceed.      For questions or updates, please contact [facility] Please consult [email or web address] for contact info under       Signed, Lesley LITTIE Maffucci, PA-C  01/03/2024, 8:15 AM

## 2024-01-03 NOTE — Anesthesia Preprocedure Evaluation (Signed)
 Anesthesia Evaluation  Patient identified by MRN, date of birth, ID band Patient awake    Reviewed: Allergy & Precautions, NPO status , Patient's Chart, lab work & pertinent test results  History of Anesthesia Complications Negative for: history of anesthetic complications  Airway Mallampati: III  TM Distance: >3 FB Neck ROM: Full    Dental  (+) Partial Upper, Poor Dentition, Dental Advidsory Given, Missing   Pulmonary neg shortness of breath, sleep apnea , neg COPD, neg recent URI, Patient abstained from smoking.Not current smoker, former smoker   Pulmonary exam normal breath sounds clear to auscultation       Cardiovascular Exercise Tolerance: Good METShypertension, Pt. on medications +CHF  (-) CAD and (-) Past MI + dysrhythmias Atrial Fibrillation (-) Valvular Problems/Murmurs Rhythm:Regular Rate:Bradycardia - Systolic murmurs Echo 12/22/23: 1. Left ventricular ejection fraction, by estimation, is 55 to 60%. Left ventricular ejection fraction by PLAX is 66 %. The left ventricle has normal function. The left ventricle has no regional wall motion abnormalities. There is mild left ventricular hypertrophy. Left ventricular diastolic parameters are indeterminate.   2. Right ventricular systolic function is normal. The right ventricular size is normal.   3. The mitral valve is normal in structure. No evidence of mitral valve regurgitation.   4. The aortic valve was not well visualized. Aortic valve regurgitation is not visualized.    Neuro/Psych neg Seizures PSYCHIATRIC DISORDERS  Depression     Neuromuscular disease CVA, No Residual Symptoms    GI/Hepatic PUD,GERD  Controlled,,(+)     (-) substance abuse    Endo/Other  neg diabetes    Renal/GU CRFRenal disease     Musculoskeletal  (+) Arthritis ,    Abdominal   Peds  Hematology   Anesthesia Other Findings Past Medical History: No date: Alcoholism (HCC) No date: Aortic  atherosclerosis (HCC) No date: Arthritis 03/28/2018: Ascending aorta dilation (HCC)     Comment:  a.) Vascular US : prox asc Ao measured 29 mm. b.)  CT CAP              06/22/2021: Ao root 41 mm. c.) TTE 06/26/2021: Ao root 41              mm; asc Ao 39 mm 1995: Atrial fibrillation (HCC)     Comment:  a.) single episode in 1995 per patient; no long term               treatment. b.) recurrent episode in the setting of GI               bleeding related to colitis 06/2021. 04/26/2017: Basal cell carcinoma     Comment:  Right medial cheek. Superficial and nodular 06/11/2019: Basal cell carcinoma     Comment:  Left anterior shoulder. Nodular pattern 09/09/2019: Basal cell carcinoma     Comment:  Right nasal ala, EDC 02/05/2020: Basal cell carcinoma     Comment:  L upper eyebrow, EDC  06/09/2019: Bilateral carotid artery stenosis     Comment:  a.) Carotid doppler: mod; <50% BILATERAL ICAs. No date: Chicken pox No date: Colon cancer (HCC) No date: Colon polyp No date: Depression     Comment:  Son died 2015-01-30 No date: Diverticulitis 30 years: Diverticulosis No date: Gastritis No date: GERD (gastroesophageal reflux disease) No date: H. pylori infection No date: History of stress test     Comment:  a. 09/2015 MV: No ischemia/infarct. EF 45-54% (nl by  echo). No date: Hyperlipidemia No date: Hypertension No date: Irritable bowel syndrome 06/08/2019: Lacunar infarction [facility])     Comment:  a.) small; RIGHT motor strip No date: NSVT (nonsustained ventricular tachycardia) (HCC)     Comment:  a.)  Single episode lasting 5 beats at a maximum rate of              160 bpm noted on Holter study performed 08/13/2021. 2012: Prostate cancer (HCC)     Comment:  a.) s/p XRT No date: PSVT (paroxysmal supraventricular tachycardia) (HCC)     Comment:  a. 06/2019 Zio: Avg rate 74 (54-120), occas PACs, rare               PVCs, 125 episodes of PVCs (longest 17.5 secs; max rate                187). No afib. No date: RBBB 10/10/2018: SAH (subarachnoid hemorrhage) (HCC) 07/18/2017: Squamous cell carcinoma of skin     Comment:  Left medial calf. KA type 04/24/2018: Squamous cell carcinoma of skin     Comment:  Right above med. brow 06/11/2019: Squamous cell carcinoma of skin     Comment:  Right posterior shoulder. SCCis, hypertrophic 01/09/2020: Squamous cell carcinoma of skin     Comment:  Mid nasal dorsum, MOHS, Efudex x 4wks No date: Systolic dysfunction     Comment:  a.) TTE 10/05/2015: EF 50-55%; mild LVH; LAE; triv AR,               mild MR. b.) TTE 03/29/2018: EF 55-60%; LAE, mild AR; ?               small PFO. c.) TTE 06/09/2019: EF 55-60%, no rwma, triv               MR/AI. d.) TTE 06/26/2021: EF 45-50%; glob HK; LAE; mild               MR; Ao root 41 mm; asc Ao 39 mm. No date: Vasovagal syncope  Reproductive/Obstetrics                              Anesthesia Physical Anesthesia Plan  ASA: 3  Anesthesia Plan: General   Post-op Pain Management: Minimal or no pain anticipated   Induction: Intravenous  PONV Risk Score and Plan: 1 and Propofol  infusion, TIVA and Treatment may vary due to age or medical condition  Airway Management Planned: Natural Airway and Nasal Cannula  Additional Equipment: None  Intra-op Plan:   Post-operative Plan:   Informed Consent: I have reviewed the patients History and Physical, chart, labs and discussed the procedure including the risks, benefits and alternatives for the proposed anesthesia with the patient or authorized representative who has indicated his/her understanding and acceptance.       Plan Discussed with: CRNA and Surgeon  Anesthesia Plan Comments: (Discussed the role of CRNA in patient's perioperative care.  Patient voiced understanding.)         Anesthesia Quick Evaluation

## 2024-01-04 ENCOUNTER — Encounter: Payer: Self-pay | Admitting: Cardiovascular Disease

## 2024-01-04 ENCOUNTER — Other Ambulatory Visit: Payer: Self-pay

## 2024-01-04 DIAGNOSIS — I5030 Unspecified diastolic (congestive) heart failure: Secondary | ICD-10-CM | POA: Diagnosis not present

## 2024-01-04 DIAGNOSIS — Z8679 Personal history of other diseases of the circulatory system: Secondary | ICD-10-CM

## 2024-01-04 DIAGNOSIS — I4891 Unspecified atrial fibrillation: Secondary | ICD-10-CM | POA: Diagnosis not present

## 2024-01-04 DIAGNOSIS — I5023 Acute on chronic systolic (congestive) heart failure: Secondary | ICD-10-CM

## 2024-01-04 DIAGNOSIS — R079 Chest pain, unspecified: Secondary | ICD-10-CM | POA: Diagnosis not present

## 2024-01-04 DIAGNOSIS — N1831 Chronic kidney disease, stage 3a: Secondary | ICD-10-CM | POA: Diagnosis not present

## 2024-01-04 DIAGNOSIS — R0602 Shortness of breath: Secondary | ICD-10-CM

## 2024-01-04 LAB — CBC WITH DIFFERENTIAL/PLATELET
Abs Immature Granulocytes: 0.03 K/uL (ref 0.00–0.07)
Basophils Absolute: 0 K/uL (ref 0.0–0.1)
Basophils Relative: 0 %
Eosinophils Absolute: 0.3 K/uL (ref 0.0–0.5)
Eosinophils Relative: 5 %
HCT: 32.1 % — ABNORMAL LOW (ref 39.0–52.0)
Hemoglobin: 10.2 g/dL — ABNORMAL LOW (ref 13.0–17.0)
Immature Granulocytes: 0 %
Lymphocytes Relative: 15 %
Lymphs Abs: 1 K/uL (ref 0.7–4.0)
MCH: 29.7 pg (ref 26.0–34.0)
MCHC: 31.8 g/dL (ref 30.0–36.0)
MCV: 93.3 fL (ref 80.0–100.0)
Monocytes Absolute: 0.7 K/uL (ref 0.1–1.0)
Monocytes Relative: 9 %
Neutro Abs: 4.9 K/uL (ref 1.7–7.7)
Neutrophils Relative %: 71 %
Platelets: 205 K/uL (ref 150–400)
RBC: 3.44 MIL/uL — ABNORMAL LOW (ref 4.22–5.81)
RDW: 12.7 % (ref 11.5–15.5)
WBC: 7 K/uL (ref 4.0–10.5)
nRBC: 0 % (ref 0.0–0.2)

## 2024-01-04 LAB — COMPREHENSIVE METABOLIC PANEL WITH GFR
ALT: 13 U/L (ref 0–44)
AST: 19 U/L (ref 15–41)
Albumin: 3.1 g/dL — ABNORMAL LOW (ref 3.5–5.0)
Alkaline Phosphatase: 46 U/L (ref 38–126)
Anion gap: 5 (ref 5–15)
BUN: 21 mg/dL (ref 8–23)
CO2: 27 mmol/L (ref 22–32)
Calcium: 8.7 mg/dL — ABNORMAL LOW (ref 8.9–10.3)
Chloride: 104 mmol/L (ref 98–111)
Creatinine, Ser: 1.53 mg/dL — ABNORMAL HIGH (ref 0.61–1.24)
GFR, Estimated: 45 mL/min — ABNORMAL LOW (ref >60)
Glucose, Bld: 103 mg/dL — ABNORMAL HIGH (ref 70–99)
Potassium: 4.2 mmol/L (ref 3.5–5.1)
Sodium: 136 mmol/L (ref 135–145)
Total Bilirubin: 0.8 mg/dL (ref 0.0–1.2)
Total Protein: 6.6 g/dL (ref 6.5–8.1)

## 2024-01-04 LAB — MAGNESIUM: Magnesium: 2.2 mg/dL (ref 1.7–2.4)

## 2024-01-04 LAB — PHOSPHORUS: Phosphorus: 3.6 mg/dL (ref 2.5–4.6)

## 2024-01-04 MED ORDER — METOPROLOL SUCCINATE ER 100 MG PO TB24
100.0000 mg | ORAL_TABLET | Freq: Every day | ORAL | 0 refills | Status: DC
  Filled 2024-01-04: qty 30, 30d supply, fill #0

## 2024-01-04 MED ORDER — LOSARTAN POTASSIUM 25 MG PO TABS
25.0000 mg | ORAL_TABLET | Freq: Every day | ORAL | Status: DC
  Administered 2024-01-04: 25 mg via ORAL
  Filled 2024-01-04: qty 1

## 2024-01-04 MED ORDER — AMIODARONE HCL 400 MG PO TABS
400.0000 mg | ORAL_TABLET | Freq: Two times a day (BID) | ORAL | 0 refills | Status: DC
  Filled 2024-01-04: qty 60, 30d supply, fill #0

## 2024-01-04 MED ORDER — AMIODARONE HCL 200 MG PO TABS
ORAL_TABLET | ORAL | 0 refills | Status: DC
  Filled 2024-01-04: qty 88, 37d supply, fill #0

## 2024-01-04 MED ORDER — LOSARTAN POTASSIUM 25 MG PO TABS
25.0000 mg | ORAL_TABLET | Freq: Every day | ORAL | 0 refills | Status: DC
  Filled 2024-01-04: qty 30, 30d supply, fill #0

## 2024-01-04 MED ORDER — METOPROLOL SUCCINATE ER 100 MG PO TB24
100.0000 mg | ORAL_TABLET | Freq: Every day | ORAL | Status: DC
  Administered 2024-01-04: 100 mg via ORAL
  Filled 2024-01-04: qty 1

## 2024-01-04 NOTE — Progress Notes (Signed)
 OT Cancellation Note  Patient Details Name: Cameron Foster. MRN: [id number] DOB: 17-Feb-1941   Cancelled Treatment:    Reason Eval/Treat Not Completed: Pain limiting ability to participate. Chart reviewed. Per conversation with PT, pt near baseline. On arrival pt declines evaluation at this time, reports 5/10 chest pain, RN notified/aware. Pt pending discharge, may benefit from HHOT, will follow up as available to assess as pt willing.   Elston Slot, M.S. OTR/L  01/04/24, 11:48 AM  ascom [phone]

## 2024-01-04 NOTE — Progress Notes (Signed)
 Progress Note  Patient Name: Cameron Foster. Date of Encounter: 01/04/2024  Primary Cardiologist: End  Subjective   Breathing improved. Maintaining sinus rhythm following DCCV on 9/17. Does not like nebulizer. No chest pain.   Inpatient Medications    Scheduled Meds:  amiodarone   400 mg Oral BID   apixaban   5 mg Oral BID   atorvastatin   40 mg Oral Daily   docusate sodium   200 mg Oral Daily   fluticasone   1 spray Each Nare Daily   folic acid   1 mg Oral Daily   guaiFENesin   600 mg Oral BID   ipratropium-albuterol   3 mL Nebulization BID   metoprolol  tartrate  75 mg Oral BID   multivitamin with minerals  1 tablet Oral Daily   nicotine   21 mg Transdermal Daily   sertraline   100 mg Oral Daily   thiamine   100 mg Oral Daily   Or   thiamine   100 mg Intravenous Daily   Continuous Infusions:  PRN Meds: acetaminophen  **OR** acetaminophen , ipratropium-albuterol , ondansetron  **OR** ondansetron  (ZOFRAN ) IV   Vital Signs    Vitals:   01/03/24 2042 01/03/24 2139 01/04/24 0428 01/04/24 0703  BP: 132/80 121/71 (!) 140/88   Pulse:  70 67   Resp: 18  18   Temp: 98.7 F (37.1 C)  98.4 F (36.9 C)   TempSrc:   Oral   SpO2: 95%  93%   Weight:    95.2 kg  Height:        Intake/Output Summary (Last 24 hours) at 01/04/2024 0805 Last data filed at 01/04/2024 0500 Gross per 24 hour  Intake 340 ml  Output 350 ml  Net -10 ml   Filed Weights   01/02/24 0500 01/03/24 0410 01/04/24 0703  Weight: 97.4 kg 95.1 kg 95.2 kg    Telemetry    SR, 60s bpm - Personally Reviewed  ECG    None available for review - Personally Reviewed  Physical Exam   GEN: No acute distress.   Neck: No JVD. Cardiac: RRR, no murmurs, rubs, or gallops.  Respiratory: Clear to auscultation bilaterally.  GI: Soft, nontender, non-distended.   MS: No edema; No deformity. Neuro:  Alert and oriented x 3; Nonfocal.  Psych: Normal affect.  Labs    Chemistry Recent Labs  Lab 01/01/24 0105  01/01/24 2010 01/02/24 0438 01/03/24 0430 01/04/24 0450  NA  --    < > 138 138 136  K  --    < > 3.5 4.3 4.2  CL  --    < > 103 103 104  CO2  --    < > 27 26 27   GLUCOSE  --    < > 106* 106* 103*  BUN  --    < > 18 17 21   CREATININE  --    < > 1.29* 1.37* 1.53*  CALCIUM   --    < > 8.7* 9.0 8.7*  PROT 6.3*  --   --   --  6.6  ALBUMIN  3.2*  --   --   --  3.1*  AST 24  --   --   --  19  ALT 20  --   --   --  13  ALKPHOS 49  --   --   --  46  BILITOT 0.6  --   --   --  0.8  GFRNONAA  --    < > 55* 51* 45*  ANIONGAP  --    < >  8 9 5    < > = values in this interval not displayed.     Hematology Recent Labs  Lab 01/01/24 2010 01/03/24 0430 01/04/24 0450  WBC 7.7 7.9 7.0  RBC 3.57* 3.79* 3.44*  HGB 10.7* 11.5* 10.2*  HCT 33.5* 34.8* 32.1*  MCV 93.8 91.8 93.3  MCH 30.0 30.3 29.7  MCHC 31.9 33.0 31.8  RDW 12.7 12.8 12.7  PLT 186 227 205    Cardiac EnzymesNo results for input(s): TROPONINI in the last 168 hours. No results for input(s): TROPIPOC in the last 168 hours.   BNP Recent Labs  Lab 01/01/24 0105  BNP 523.8*     DDimer No results for input(s): DDIMER in the last 168 hours.   Radiology      Cardiac Studies   TEE/DCCV 01/03/2024: 1. Left ventricular ejection fraction, by estimation, is 30 to 35%. The  left ventricle has moderately decreased function. The left ventricle  demonstrates global hypokinesis.   2. Right ventricular systolic function is normal. The right ventricular  size is normal.   3. Left atrial size was severely dilated. No left atrial/left atrial  appendage thrombus was detected.   4. The mitral valve is normal in structure. Mild mitral valve  regurgitation. No evidence of mitral stenosis.   5. The aortic valve is tricuspid. Aortic valve regurgitation is not  visualized. No aortic stenosis is present.   6. There is mild (Grade II) atheroma plaque involving the aortic arch and  descending aorta.   7. The inferior vena cava is  normal in size with greater than 50%  respiratory variability, suggesting right atrial pressure of 3 mmHg.   8. Agitated saline contrast bubble study was positive with shunting  observed within 3-6 cardiac cycles suggestive of interatrial shunt. There  is a small patent foramen ovale with predominantly left to right shunting  across the atrial septum.   Cardioversion for atrial fibrillation with RVR to follow: normal sinus  rhythm restored  __________  2D echo 12/22/2023: 1. Left ventricular ejection fraction, by estimation, is 55 to 60%. Left  ventricular ejection fraction by PLAX is 66 %. The left ventricle has  normal function. The left ventricle has no regional wall motion  abnormalities. There is mild left ventricular  hypertrophy. Left ventricular diastolic parameters are indeterminate.   2. Right ventricular systolic function is normal. The right ventricular  size is normal.   3. The mitral valve is normal in structure. No evidence of mitral valve  regurgitation.   4. The aortic valve was not well visualized. Aortic valve regurgitation  is not visualized.   Patient Profile     83 y.o. male with history of HFimpEF with recurrent cardiomyopathy, PAF on apixaban , diverticulitis status post sigmoid colectomy, GI bleed, lacunar CVA, HTN, and CKD stage III AA who was admitted on 01/01/2024 with A-fib with RVR.  Assessment & Plan    Atrial fibrillation with RVR - Recent hospitalization earlier this month for atrial fibrillation with RVR.  Discharged with rate well-controlled on metoprolol  and diltiazem .  Started on Eliquis  with plan for DCCV after 3 weeks of anticoagulation. - Patient presented 9/14 with chest discomfort and found to be in atrial fibrillation with RVR, up to 130s bpm. - Status post successful TEE guided DCCV on 01/03/2024, maintaining sinus rhythm. - Given recurrence of cardiomyopathy, transition Lopressor  to Toprol -XL 100 mg daily. -With left atrial appendage thrombus  excluded on TEE, amiodarone  has been resumed at 400 mg twice daily which should be  continued until 01/10/2024 at which time this should be reduced to 200 mg twice daily for 7 days followed by 20 mg daily thereafter. - Continue Eliquis  5 mg twice daily for CHA2DS2-VASc of 7   Chest pain - Largely atypical in nature. - Troponin negative x 2. - EKG without acute ischemic changes. - Suspect this is likely secondary to RVR, pain largely resolved with rate control. - No indication for ischemic evaluation at this time.   HFimpEF with recurrent cardiomyopathy Hypertension - Echo 12/22/2023 with EF 55 to 60%.  - TEE with EF 30 to 35% - Transition Lopressor  to Toprol -XL 100 mg daily, add losartan  25 mg daily - Escalate GDMT with transition from ARB to ARNI, addition of SGLT2 inhibitor, and possibly MRA as able moving forward - Not currently requiring diuresis, though suspect he will need low-dose furosemide  at time of discharge - BNP 523 - Received IV Lasix  20 mg x 2 in the ED with good UOP - Appears euvolemic and well compensated    Hx GI bleed Hx sigmoid colectomy due to diverticulosis - Stable without symptoms at this time. - Continue to monitor with anticoagulation. - Consider EP referral for left atrial appendage occlusion.    Stage IIIa CKD - Renal function appears near baseline.   Alcohol use disorder - Recommend cessation, likely contributing to atrial fibrillation.       For questions or updates, please contact [facility] Please consult [email or web address] for contact info under Cardiology/STEMI.    Signed, Bernardino Bring, PA-C [facility] Pager: [phone] 01/04/2024, 8:05 AM

## 2024-01-04 NOTE — Discharge Summary (Signed)
 DISCHARGE SUMMARY    Cameron Foster. FMW:[id number] DOB: 06/20/1940 DOA: 01/01/2024  PCP: Myrla Jon [name], Cameron Foster  Admit date: 01/01/2024 Discharge date: 01/04/2024   Recommendations for Outpatient Follow-up:  Follow-up closely with cardiology for continued disease management and medication titration Follow-up with primary care for chronic disease management and repeat labs in 2 weeks   Hospital Course: 83yo with h/o PAF on Eliquis , chronic HFrEF, diverticulitis s/p sigmoid colectomy, CVA, HTN, stage 3a CKD, ETOH use d/o, and tobacco use who presented on 9/15 with chest pain. He was previously hospitalized from 9/3-7 for afib with RVR and was found to have recurrence. He was given Lasix  and IV metoprolol .  He underwent TEE/DCCV on 9/17 which was successful.  He remained stable and rate controlled on telemetry afterwards. He did complain of some chest pain which he reports improved after eating and thinks may be related to his GERD.  Advised him on as needed Pepcid  in addition to his daily PPI therapy. By reevaluation on 9/18 patient reported he was feeling back to baseline and ready to discharge home.  Plan was discussed extensively with the patient as well as with his sister Norvel, at bedside.  Home health physical therapy and Occupational Therapy was arranged prior to discharge.  Atrial fibrillation with RVR - TEE/DCCV 9/17 successful. - Has been initiated on amiodarone  per cardiology. - Started on metoprolol  and losartan  - Continue Eliquis  for now, does have bleeding risk as noted below.   Acute on chronic heart failure with reduced EF - Echo 12/26/2023: EF 55 to 60%.  Echo during procedure today reveals severely reduced LV function with global hypokinesis, no mural apical thrombus, estimated EF 30%.  Likely secondary to atrial fibrillation tachycardia - GDMT has been initiated.  Continue titration as tolerated - Heart failure follow-up has been established.   Chest pain -  Likely related to RVR, perhaps some component of GERD as patient reports it improves with food - May also be component of costochondritis given recurrent coughing.  Not an excellent candidate for NSAIDs given cardiac disease, and CKD, recommended conservative management - Troponins negative, EKG nonacute - No plans for ischemic evaluation at this time.  Cardiology aware, plans for outpatient follow-up   Sputum production, cough.  Chronic - Received some DuoNebs while admitted - Recommended Mucinex  DM outpatient, patient also uses albuterol  inhaler occasionally. - No smoking history - No fever while admitted.  No wheezing.  Tobacco abuse - Uses chew tobacco.  No inhalants. - Received nicotine  patch while admitted.  Counseled on the importance of cessation.   History of GI bleed History of sigmoid colectomy secondary diverticulosis - Currently on Eliquis .  Hemoglobin stable.  Monitor closely   History of subarachnoid hemorrhage - Stable presently.  Wean off of anticoagulation as soon as able   Stage IIIa CKD - At baseline - Avoid nephrotoxic medications when able   Alcohol use disorder - Continue CIWA protocol - Cessation has been encouraged.  Likely contributing to A-fib.   Generalized weakness - Patient appears significantly debilitated.  Home health ordered at discharge.  Discharge Instructions  Discharge Instructions     Amb referral to AFIB Clinic   Complete by: As directed    Call Cameron Foster for:  difficulty breathing, headache or visual disturbances   Complete by: As directed    Call Cameron Foster for:  persistant dizziness or light-headedness   Complete by: As directed    Call Cameron Foster for:  persistant nausea and vomiting   Complete by:  As directed    Call Cameron Foster for:  severe uncontrolled pain   Complete by: As directed    Call Cameron Foster for:  temperature >100.4   Complete by: As directed    Diet general   Complete by: As directed    Discharge instructions   Complete by: As directed    Follow  up with your primary care physician to discuss the medication changes during this admission.  Continue close follow up with cardiology.  Use your albuterol  inhaler every 4 hours as needed.  Mucinex  DM for coughing and mucus production.  Do not take Mucinex  D   Increase activity slowly   Complete by: As directed       Allergies as of 01/04/2024       Reactions   Amoxicillin -pot Clavulanate Other (See Comments)   Abdominal upset Has patient had a PCN reaction causing immediate rash, facial/tongue/throat swelling, SOB or lightheadedness with hypotension: Unknown Has patient had a PCN reaction causing severe rash involving mucus membranes or skin necrosis: Unknown Has patient had a PCN reaction that required hospitalization: Unknown Has patient had a PCN reaction occurring within the last 10 years: Unknown If all of the above answers are NO, then may proceed with Cephalosporin use. amoxicillin  / clavulanate   Formaldehyde Rash   From shoes made with this material        Medication List     STOP taking these medications    diltiazem  120 MG 24 hr capsule Commonly known as: CARDIZEM  CD   metoprolol  tartrate 25 MG tablet Commonly known as: LOPRESSOR    metoprolol  tartrate 50 MG tablet Commonly known as: LOPRESSOR        TAKE these medications    albuterol  108 (90 Base) MCG/ACT inhaler Commonly known as: VENTOLIN  HFA Inhale 2 puffs into the lungs every 6 (six) hours as needed for wheezing or shortness of breath.   Alpha-Lipoic Acid 600 MG Tabs Take 1 tablet by mouth daily.   amiodarone  400 MG tablet Commonly known as: PACERONE  Take 1 tablet (400 mg total) by mouth 2 (two) times daily for 7 days, THEN 0.5 tablets (200 mg total) 2 (two) times daily. Start taking on: January 04, 2024   apixaban  5 MG Tabs tablet Commonly known as: ELIQUIS  Take 1 tablet (5 mg total) by mouth 2 (two) times daily.   atorvastatin  40 MG tablet Commonly known as: LIPITOR TAKE 1 TABLET BY  MOUTH EVERY DAY   cetirizine  10 MG tablet Commonly known as: ZYRTEC  Take 10 mg by mouth daily.   cyanocobalamin  1000 MCG tablet Commonly known as: VITAMIN B12 Take 1,000 mcg by mouth daily.   docusate sodium  100 MG capsule Commonly known as: COLACE Take 1 capsule (100 mg total) by mouth 2 (two) times daily. What changed:  how much to take when to take this   fluticasone  50 MCG/ACT nasal spray Commonly known as: FLONASE  PLACE 1 SPRAY INTO BOTH NOSTRILS DAILY AS NEEDED FOR ALLERGIES OR RHINITIS.   folic acid  1 MG tablet Commonly known as: FOLVITE  Take 1 tablet (1 mg total) by mouth daily.   hydrocortisone  2.5 % cream APPLY TO AFFECTED AREA TWICE A DAY AS NEEDED FOR RASH   ketoconazole  2 % cream Commonly known as: NIZORAL  Apply once or twice daily to affected skin folds as needed for rash   losartan  25 MG tablet Commonly known as: COZAAR  Take 1 tablet (25 mg total) by mouth daily. Start taking on: January 05, 2024   metoprolol  succinate 100 MG  24 hr tablet Commonly known as: TOPROL -XL Take 1 tablet (100 mg total) by mouth daily. Take with or immediately following a meal. Start taking on: January 05, 2024   multivitamin with minerals Tabs tablet Take 1 tablet by mouth daily.   omeprazole  20 MG capsule Commonly known as: PRILOSEC TAKE 1 CAPSULE (20 MG TOTAL) BY MOUTH DAILY. REPORTS CURRENTLY TAKING AS NEEDED   pregabalin  25 MG capsule Commonly known as: LYRICA  Take 25 mg by mouth 2 (two) times daily.   sertraline  100 MG tablet Commonly known as: ZOLOFT  Take 1 tablet (100 mg total) by mouth daily.   tadalafil  5 MG tablet Commonly known as: CIALIS  Take 1 tablet (5 mg total) by mouth every other day as needed.   tadalafil  5 MG tablet Commonly known as: CIALIS  Take 1 tablet (5 mg total) by mouth daily as needed for erectile dysfunction.   thiamine  100 MG tablet Commonly known as: VITAMIN B1 Take 100 mg by mouth daily.   Vitamin D-3 25 MCG (1000 UT)  Caps Take 1,000 Units by mouth daily.   zinc gluconate 50 MG tablet Take 50 mg by mouth daily.        Follow-up Information     [facility]. Go on 01/09/2024.   Specialty: Cardiology Why: Hospital Follow-Up 01/09/24 @ 2:30 PM Please bring all medications to follow-up appointment [facility], [address] Second Floor Free Valet Parking at the door Contact information: [address] [place]  [address] [phone]               Allergies  Allergen Reactions   Amoxicillin -Pot Clavulanate Other (See Comments)    Abdominal upset  Has patient had a PCN reaction causing immediate rash, facial/tongue/throat swelling, SOB or lightheadedness with hypotension: Unknown  Has patient had a PCN reaction causing severe rash involving mucus membranes or skin necrosis: Unknown  Has patient had a PCN reaction that required hospitalization: Unknown  Has patient had a PCN reaction occurring within the last 10 years: Unknown  If all of the above answers are NO, then may proceed with Cephalosporin use.  amoxicillin  / clavulanate   Formaldehyde Rash    From shoes made with this material    Consultations: Treatment Team:  Cameron Cameron Foster LABOR, Cameron Foster   Procedures/Studies: ECHO TEE Result Date: 01/03/2024    TRANSESOPHOGEAL ECHO REPORT   Patient Name:   Larron Armor. Date of Exam: 01/03/2024 Medical Rec #:  [id number]          Height:       69.0 in Accession #:    [id number]         Weight:       209.7 lb Date of Birth:  Jul 12, 1940          BSA:          2.108 m Patient Age:    83 years           BP:           125/110 mmHg Patient Gender: M                  HR:           70 bpm. Exam Location:  [facility] Procedure: Transesophageal Echo, Saline Contrast Bubble Study and Color Doppler            (Both Spectral and Color Flow Doppler were utilized during  procedure). Indications:     Atrial  fibrillation/Cardioversion  History:         Patient has prior history of Echocardiogram examinations, most                  recent 12/22/2023. Arrythmias:Atrial Fibrillation.  Sonographer:     Ashley McNeely-Sloane Referring Phys:  [id number] [facility] L CAREY Diagnosing Phys: Cameron Foster PROCEDURE: After discussion of the risks and benefits of a TEE, an informed consent was obtained from the patient. TEE procedure time was 30 minutes. The transesophogeal probe was passed without difficulty through the esophogus of the patient. Local oropharyngeal anesthetic was provided with viscous lidocaine . Sedation performed by different physician. Image quality was excellent. The patient's vital signs; including heart rate, blood pressure, and oxygen saturation; remained stable throughout the procedure. The patient developed no complications during the procedure. A successful direct current cardioversion was performed at 150 joules with 1 attempt.  IMPRESSIONS  1. Left ventricular ejection fraction, by estimation, is 30 to 35%. The left ventricle has moderately decreased function. The left ventricle demonstrates global hypokinesis.  2. Right ventricular systolic function is normal. The right ventricular size is normal.  3. Left atrial size was severely dilated. No left atrial/left atrial appendage thrombus was detected.  4. The mitral valve is normal in structure. Mild mitral valve regurgitation. No evidence of mitral stenosis.  5. The aortic valve is tricuspid. Aortic valve regurgitation is not visualized. No aortic stenosis is present.  6. There is mild (Grade II) atheroma plaque involving the aortic arch and descending aorta.  7. The inferior vena cava is normal in size with greater than 50% respiratory variability, suggesting right atrial pressure of 3 mmHg.  8. Agitated saline contrast bubble study was positive with shunting observed within 3-6 cardiac cycles suggestive of interatrial shunt. There is a small patent foramen  ovale with predominantly left to right shunting across the atrial septum. Cardioversion for atrial fibrillation with RVR to follow: normal sinus rhythm restored FINDINGS  Left Ventricle: Left ventricular ejection fraction, by estimation, is 30 to 35%. The left ventricle has moderately decreased function. The left ventricle demonstrates global hypokinesis. The left ventricular internal cavity size was normal in size. There is no left ventricular hypertrophy. Right Ventricle: The right ventricular size is normal. No increase in right ventricular wall thickness. Right ventricular systolic function is normal. Left Atrium: Left atrial size was severely dilated. Spontaneous echo contrast was present. No left atrial/left atrial appendage thrombus was detected. Right Atrium: Right atrial size was normal in size. Pericardium: There is no evidence of pericardial effusion. Mitral Valve: The mitral valve is normal in structure. Mild mitral valve regurgitation. No evidence of mitral valve stenosis. Tricuspid Valve: The tricuspid valve is normal in structure. Tricuspid valve regurgitation is not demonstrated. No evidence of tricuspid stenosis. Aortic Valve: The aortic valve is tricuspid. Aortic valve regurgitation is not visualized. No aortic stenosis is present. Pulmonic Valve: The pulmonic valve was normal in structure. Pulmonic valve regurgitation is not visualized. No evidence of pulmonic stenosis. Aorta: The aortic root is normal in size and structure. There is mild (Grade II) atheroma plaque involving the aortic arch and descending aorta. Venous: The inferior vena cava is normal in size with greater than 50% respiratory variability, suggesting right atrial pressure of 3 mmHg. IAS/Shunts: No atrial level shunt detected by color flow Doppler. Agitated saline contrast was given intravenously to evaluate for intracardiac shunting. Agitated saline contrast bubble study was positive with shunting observed within  3-6 cardiac  cycles suggestive of interatrial shunt. A small patent foramen ovale is detected with predominantly left to right shunting across the atrial septum. Additional Comments: 3D was performed not requiring image post processing on an independent workstation and was indeterminate. Cameron Foster Electronically signed by Cameron Foster Signature Date/Time: 01/03/2024/6:07:31 PM    Final    CT Angio Chest PE W and/or Wo Contrast Result Date: 01/01/2024 EXAM: CTA of the Chest with contrast for PE 01/01/2024 02:19:11 AM TECHNIQUE: CTA of the chest was performed after the administration of intravenous contrast. Multiplanar reformatted images are provided for review. MIP images are provided for review. Automated exposure control, iterative reconstruction, and/or weight based adjustment of the mA/kV was utilized to reduce the radiation dose to as low as reasonably achievable. COMPARISON: 03/14/2020 CLINICAL HISTORY: Pleuritic chest pain, A-fib RVR, new diagnosis of A-fib earlier this month with unclear etiology. Table formatting from the original note was not included. Patient c/o chest pain since 1730, getting progressively worse. Patient also endorses tremors in hands. Patient denies sob, n/v. FINDINGS: PULMONARY ARTERIES: Pulmonary arteries are adequately opacified for evaluation. No pulmonary embolism. Main pulmonary artery is normal in caliber. MEDIASTINUM: Mild global cardiomegaly. No pericardial effusion. Mild atherosclerotic calcification within the thoracic aorta. Stable fusiform dilation of the thoracic aorta measuring 4.3 cm at the level of the sinuses of valsalva, 4.1 cm in its ascending segment, and 3.8 cm in its proximal descending segment. Recommend annual imaging followup by CTA or MRA. This recommendation follows 2010 ACCF/AHA/AATS/ACR/ASA/SCA/SCAI/SIR/STS/SVM guidelines for the diagnosis and management of patients with thoracic aortic disease. Circulation. 2010; 121: z733-z630. LYMPH NODES: Progressive  mild mediastinal adenopathy with multiple right paratracheal, aortopulmonary, and subsequent lymph nodes measuring up to 12-13 mm in short axis diameter. This is nonspecific, but may be reactive, inflammatory, or lymphoproliferative in etiology. Follow-up CT or PET CT examination would be helpful in 3 months to demonstrate stability or resolution. LUNGS AND PLEURA: Mosaic attenuation of the pulmonary parenchyma in keeping with air trapping related to small airways disease. Superimposed extensive bronchial wall thickening noted in keeping with airway inflammation. Bronchomalacia involving the right bronchus intermedius noted. No pneumothorax or pleural effusion. UPPER ABDOMEN: Small hiatal hernia. SOFT TISSUES AND BONES: Osseous structures are age appropriate. No acute bone abnormality. No lytic or blastic bone lesion. RAF SCORE: Aortic atherosclerosis (ICD10-I70.0), aortic aneurysm (ICD10-I71.9) IMPRESSION: 1. No pulmonary embolism. 2. Stable fusiform dilation of the thoracic aorta measuring 4.3 cm at the level of the sinuses of Valsalva, 4.1 cm in its ascending segment, and 3.8 cm in its proximal descending segment. Recommend annual imaging follow-up by CTA or MRA. 3. Progressive mild mediastinal adenopathy with multiple right paratracheal, aortopulmonary, and subsequent lymph nodes measuring up to 12-13 mm in short axis diameter. This is nonspecific, but may be reactive, inflammatory, or lymphoproliferative in etiology. Follow-up CT or PET CT examination would be helpful in 3 months to demonstrate stability or resolution. 4. Mosaic attenuation of the pulmonary parenchyma in keeping with air trapping related to small airways disease, with superimposed extensive bronchial wall thickening in keeping with airway inflammation. Bronchomalacia involving the right bronchus intermedius noted. Electronically signed by: Cameron Foster 01/01/2024 02:39 AM EDT RP Workstation: [id number]   US  Venous Img Lower Unilateral  Right Result Date: 01/01/2024 CLINICAL DATA:  Initial evaluation for acute right lower extremity swelling, evaluate FOR DVT. EXAM: RIGHT LOWER EXTREMITY VENOUS DOPPLER ULTRASOUND TECHNIQUE: Gray-scale sonography with graded compression, as well as color Doppler and duplex ultrasound were  performed to evaluate the lower extremity deep venous systems from the level of the common femoral vein and including the common femoral, femoral, profunda femoral, popliteal and calf veins including the posterior tibial, peroneal and gastrocnemius veins when visible. The superficial great saphenous vein was also interrogated. Spectral Doppler was utilized to evaluate flow at rest and with distal augmentation maneuvers in the common femoral, femoral and popliteal veins. COMPARISON:  None Available. FINDINGS: Contralateral Common Femoral Vein: Respiratory phasicity is normal and symmetric with the symptomatic side. No evidence of thrombus. Normal compressibility. Common Femoral Vein: No evidence of thrombus. Normal compressibility, respiratory phasicity and response to augmentation. Saphenofemoral Junction: No evidence of thrombus. Normal compressibility and flow on color Doppler imaging. Profunda Femoral Vein: No evidence of thrombus. Normal compressibility and flow on color Doppler imaging. Femoral Vein: No evidence of thrombus. Normal compressibility, respiratory phasicity and response to augmentation. Popliteal Vein: No evidence of thrombus. Normal compressibility, respiratory phasicity and response to augmentation. Calf Veins: No evidence of thrombus. Normal compressibility and flow on color Doppler imaging. Superficial Great Saphenous Vein: No evidence of thrombus. Normal compressibility. Venous Reflux:  None. Other Findings: Diffuse interstitial edema with swelling noted within the subcutaneous fat of the lower leg. IMPRESSION: 1. No evidence of deep venous thrombosis. 2. Diffuse soft tissue/interstitial edema within the  subcutaneous soft tissues of the right lower leg. Electronically Signed   By: Morene Hoard M.D.   On: 01/01/2024 02:29   DG Chest 2 View Result Date: 12/31/2023 CLINICAL DATA:  Chest pain EXAM: CHEST - 2 VIEW COMPARISON:  12/20/2023 FINDINGS: Cardiac shadow is stable. Aortic calcifications are again seen. Mild central vascular congestion is noted without significant edema. No focal infiltrate is noted. Degenerative changes of the thoracic spine are noted. IMPRESSION: Vascular congestion without edema. Electronically Signed   By: Cameron Devonshire M.D.   On: 12/31/2023 20:46   ECHOCARDIOGRAM COMPLETE Result Date: 12/22/2023    ECHOCARDIOGRAM REPORT   Patient Name:   Zaiyden Strozier. Date of Exam: 12/22/2023 Medical Rec #:  [id number]          Height:       69.0 in Accession #:    [id number]         Weight:       210.0 lb Date of Birth:  12-01-1940          BSA:          2.109 m Patient Age:    83 years           BP:           125/98 mmHg Patient Gender: M                  HR:           75 bpm. Exam Location:  [facility] Procedure: 2D Echo, Cardiac Doppler and Color Doppler (Both Spectral and Color            Flow Doppler were utilized during procedure). Indications:     Atrial Fibrillation I48.91  History:         Patient has prior history of Echocardiogram examinations, most                  recent 04/27/2022. Arrythmias:Atrial Fibrillation.  Sonographer:     Rosina Dunk Referring Phys:  [id number] Cameron JINNY LUNGER Diagnosing Phys: Cameron Foster IMPRESSIONS  1. Left ventricular ejection fraction, by estimation, is 55 to 60%. Left ventricular ejection fraction by  PLAX is 66 %. The left ventricle has normal function. The left ventricle has no regional wall motion abnormalities. There is mild left ventricular hypertrophy. Left ventricular diastolic parameters are indeterminate.  2. Right ventricular systolic function is normal. The right ventricular size is normal.  3. The mitral valve is normal in  structure. No evidence of mitral valve regurgitation.  4. The aortic valve was not well visualized. Aortic valve regurgitation is not visualized. FINDINGS  Left Ventricle: Left ventricular ejection fraction, by estimation, is 55 to 60%. Left ventricular ejection fraction by PLAX is 66 %. The left ventricle has normal function. The left ventricle has no regional wall motion abnormalities. The left ventricular internal cavity size was normal in size. There is mild left ventricular hypertrophy. Left ventricular diastolic parameters are indeterminate. Right Ventricle: The right ventricular size is normal. No increase in right ventricular wall thickness. Right ventricular systolic function is normal. Left Atrium: Left atrial size was normal in size. Right Atrium: Right atrial size was normal in size. Pericardium: There is no evidence of pericardial effusion. Mitral Valve: The mitral valve is normal in structure. No evidence of mitral valve regurgitation. MV peak gradient, 4.2 mmHg. The mean mitral valve gradient is 2.0 mmHg. Tricuspid Valve: The tricuspid valve is normal in structure. Tricuspid valve regurgitation is not demonstrated. Aortic Valve: The aortic valve was not well visualized. Aortic valve regurgitation is not visualized. Aortic valve mean gradient measures 1.0 mmHg. Aortic valve peak gradient measures 2.6 mmHg. Aortic valve area, by VTI measures 2.38 cm. Pulmonic Valve: The pulmonic valve was not well visualized. Pulmonic valve regurgitation is not visualized. Aorta: The aortic root is normal in size and structure. Venous: The inferior vena cava was not well visualized. IAS/Shunts: No atrial level shunt detected by color flow Doppler.  LEFT VENTRICLE PLAX 2D LV EF:         Left            Diastology                ventricular     LV e' medial:    8.59 cm/s                ejection        LV E/e' medial:  10.2                fraction by     LV e' lateral:   10.00 cm/s                PLAX is 66      LV E/e'  lateral: 8.8                %. LVIDd:         4.70 cm LVIDs:         3.00 cm LV PW:         1.40 cm LV IVS:        1.20 cm LVOT diam:     2.20 cm LV SV:         37 LV SV Index:   18 LVOT Area:     3.80 cm  LV Volumes (MOD) LV vol d, MOD    71.6 ml A2C: LV vol d, MOD    52.5 ml A4C: LV vol s, MOD    60.7 ml A2C: LV vol s, MOD    40.6 ml A4C: LV SV MOD A2C:   10.9 ml LV SV MOD A4C:   52.5  ml LV SV MOD BP:    15.1 ml RIGHT VENTRICLE RV Basal diam:  3.20 cm RV Mid diam:    2.10 cm RV S prime:     9.90 cm/s LEFT ATRIUM             Index        RIGHT ATRIUM           Index LA diam:        4.20 cm 1.99 cm/m   RA Area:     15.20 cm LA Vol (A2C):   74.6 ml 35.37 ml/m  RA Volume:   34.90 ml  16.55 ml/m LA Vol (A4C):   55.7 ml 26.41 ml/m LA Biplane Vol: 65.9 ml 31.24 ml/m  AORTIC VALVE                    PULMONIC VALVE AV Area (Vmax):    3.06 cm     PV Vmax:        0.80 m/s AV Area (Vmean):   2.78 cm     PV Vmean:       55.000 cm/s AV Area (VTI):     2.38 cm     PV VTI:         0.131 m AV Vmax:           80.10 cm/s   PV Peak grad:   2.6 mmHg AV Vmean:          54.400 cm/s  PV Mean grad:   1.0 mmHg AV VTI:            0.156 m      RVOT Peak grad: 1 mmHg AV Peak Grad:      2.6 mmHg AV Mean Grad:      1.0 mmHg LVOT Vmax:         64.50 cm/s LVOT Vmean:        39.800 cm/s LVOT VTI:          0.098 m LVOT/AV VTI ratio: 0.63  AORTA Ao Root diam: 3.60 cm Ao Asc diam:  2.80 cm MITRAL VALVE MV Area (PHT): 6.54 cm    SHUNTS MV Area VTI:   3.72 cm    Systemic VTI:  0.10 m MV Peak grad:  4.2 mmHg    Systemic Diam: 2.20 cm MV Mean grad:  2.0 mmHg    Pulmonic VTI:  0.071 m MV Vmax:       1.02 m/s MV Vmean:      65.1 cm/s MV Decel Time: 116 msec MV E velocity: 88.00 cm/s Cameron Foster Electronically signed by Cameron Foster Signature Date/Time: 12/22/2023/4:04:52 PM    Final    DG Chest Port 1 View Result Date: 12/20/2023 CLINICAL DATA:  Tachycardia EXAM: PORTABLE CHEST 1 VIEW COMPARISON:  None Available. FINDINGS:  Lungs are well expanded, symmetric, and clear. No pneumothorax or pleural effusion. Cardiac size within normal limits. Pulmonary vascularity is normal. Osseous structures are age-appropriate. No acute bone abnormality. IMPRESSION: 1. No active disease. Electronically Signed   By: Cameron Molt M.D.   On: 12/20/2023 23:54      Discharge Exam: Vitals:   01/04/24 0428 01/04/24 0914  BP: (!) 140/88 124/76  Pulse: 67 78  Resp: 18 16  Temp: 98.4 F (36.9 C) 98.7 F (37.1 C)  SpO2: 93% 97%   Vitals:   01/03/24 2139 01/04/24 0428 01/04/24 0703 01/04/24 0914  BP: 121/71 (!) 140/88  124/76  Pulse: 70  67  78  Resp:  18  16  Temp:  98.4 F (36.9 C)  98.7 F (37.1 C)  TempSrc:  Oral  Oral  SpO2:  93%  97%  Weight:   95.2 kg   Height:        Constitutional:  Normal appearance. Non toxic-appearing.  HENT: Head Normocephalic and atraumatic.  Mucous membranes are moist.  Eyes:  Extraocular intact. Conjunctivae normal.  Cardiovascular: Rate and Rhythm: Normal rate and regular rhythm.  Pulmonary: Non labored, symmetric rise of chest wall.  Skin: warm and dry. not jaundiced.  Neurological: No focal deficit present. alert. Oriented.  Psychiatric: Mood and Affect congruent.    The results of significant diagnostics from this hospitalization (including imaging, microbiology, ancillary and laboratory) are listed below for reference.     Microbiology: No results found for this or any previous visit (from the past 240 hours).   Labs: BNP (last 3 results) Recent Labs    01/01/24 0105  BNP 523.8*   Basic Metabolic Panel: Recent Labs  Lab 12/31/23 2026 01/01/24 0105 01/01/24 2010 01/02/24 0438 01/03/24 0430 01/04/24 0450  NA 135  --  136 138 138 136  K 3.8  --  3.8 3.5 4.3 4.2  CL 104  --  101 103 103 104  CO2 23  --  26 27 26 27   GLUCOSE 126*  --  135* 106* 106* 103*  BUN 19  --  21 18 17 21   CREATININE 1.37*  --  1.57* 1.29* 1.37* 1.53*  CALCIUM  8.9  --  8.7* 8.7* 9.0 8.7*   MG  --  2.2  --   --   --  2.2  PHOS  --   --   --   --   --  3.6   Liver Function Tests: Recent Labs  Lab 01/01/24 0105 01/04/24 0450  AST 24 19  ALT 20 13  ALKPHOS 49 46  BILITOT 0.6 0.8  PROT 6.3* 6.6  ALBUMIN  3.2* 3.1*   Recent Labs  Lab 01/01/24 0105  LIPASE 40   No results for input(s): AMMONIA in the last 168 hours. CBC: Recent Labs  Lab 12/31/23 2026 01/01/24 2010 01/03/24 0430 01/04/24 0450  WBC 10.3 7.7 7.9 7.0  NEUTROABS  --  5.6 5.6 4.9  HGB 11.4* 10.7* 11.5* 10.2*  HCT 35.6* 33.5* 34.8* 32.1*  MCV 93.9 93.8 91.8 93.3  PLT 224 186 227 205   Cardiac Enzymes: No results for input(s): CKTOTAL, CKMB, CKMBINDEX, TROPONINI in the last 168 hours. BNP: Invalid input(s): POCBNP CBG: Recent Labs  Lab 01/03/24 2158  GLUCAP 92   D-Dimer No results for input(s): DDIMER in the last 72 hours. Hgb A1c No results for input(s): HGBA1C in the last 72 hours. Lipid Profile No results for input(s): CHOL, HDL, LDLCALC, TRIG, CHOLHDL, LDLDIRECT in the last 72 hours. Thyroid  function studies No results for input(s): TSH, T4TOTAL, T3FREE, THYROIDAB in the last 72 hours.  Invalid input(s): FREET3 Anemia work up No results for input(s): VITAMINB12, FOLATE, FERRITIN, TIBC, IRON, RETICCTPCT in the last 72 hours. Urinalysis    Component Value Date/Time   COLORURINE YELLOW (A) 09/12/2023 1450   APPEARANCEUR CLEAR (A) 09/12/2023 1450   APPEARANCEUR Clear 08/28/2017 0000   LABSPEC 1.018 09/12/2023 1450   PHURINE 5.0 09/12/2023 1450   GLUCOSEU NEGATIVE 09/12/2023 1450   HGBUR SMALL (A) 09/12/2023 1450   BILIRUBINUR NEGATIVE 09/12/2023 1450   BILIRUBINUR Negative 08/28/2017 0000   KETONESUR NEGATIVE 09/12/2023 1450  PROTEINUR 30 (A) 09/12/2023 1450   UROBILINOGEN 0.2 04/19/2017 1559   NITRITE NEGATIVE 09/12/2023 1450   LEUKOCYTESUR NEGATIVE 09/12/2023 1450   Sepsis Labs Recent Labs  Lab 12/31/23 2026  01/01/24 2010 01/03/24 0430 01/04/24 0450  WBC 10.3 7.7 7.9 7.0   Microbiology No results found for this or any previous visit (from the past 240 hours).   Time coordinating discharge: 32 min   SIGNED: Ashanti Ratti, DO [facility] 01/04/2024, 3:36 PM Pager   If 7PM-7AM, please contact night-coverage

## 2024-01-04 NOTE — Evaluation (Signed)
 Physical Therapy Evaluation Patient Details Name: Cameron Foster. MRN: [id number] DOB: 05-05-1940 Today's Date: 01/04/2024  History of Present Illness  Cameron Ewing. is a 82 y.o. male with paroxysmal A-fib, ascending aortic aneurysm, prior GI bleed, prior subarachnoid hemorrhage, CVA, alcohol use, basal cell carcinoma, hyperlipidemia, hypertension, depression, anxiety, diastolic CHF who presented with chest pain.  Clinical Impression  Pt is a pleasant 83 year old male who was admitted for a fib with RVR. Prior to hospitalization, pt reports use of SPC for community ambulation and no AD at home. He reports increased difficulty with ADLs recently. Pt performs bed mobility, transfers, and ambulation with min A. Pt required assistance with trunk management when getting out of bed. Able to stand from lowest bed height with Min A. Pt demonstrates ambulation with SPC however experienced 1 LOB requiring min A for correction. Pt educated about use of RW while at hospital for safety. Would benefit from skilled PT to address above deficits and promote optimal return to PLOF.          If plan is discharge home, recommend the following: A little help with walking and/or transfers;A little help with bathing/dressing/bathroom;Assistance with cooking/housework;Assist for transportation;Help with stairs or ramp for entrance   Can travel by private vehicle        Equipment Recommendations None recommended by PT  Recommendations for Other Services       Functional Status Assessment Patient has had a recent decline in their functional status and demonstrates the ability to make significant improvements in function in a reasonable and predictable amount of time.     Precautions / Restrictions Precautions Precautions: Fall Recall of Precautions/Restrictions: Intact Restrictions Weight Bearing Restrictions Per Provider Order: No      Mobility  Bed Mobility Overal bed mobility: Needs  Assistance Bed Mobility: Supine to Sit     Supine to sit: Min assist     General bed mobility comments: HOB elevated. Min A for trunk management    Transfers Overall transfer level: Needs assistance Equipment used: Straight cane Transfers: Sit to/from Stand, Bed to chair/wheelchair/BSC Sit to Stand: Min assist   Step pivot transfers: Contact guard assist       General transfer comment: Min A for STS from lowest bed height for lift off of bed. Verbal cuing to push from bed and for positioning for more successful stand.    Ambulation/Gait Ambulation/Gait assistance: Contact guard assist, Min assist Gait Distance (Feet): 80 Feet Assistive device: Straight cane Gait Pattern/deviations: Decreased step length - right, Decreased step length - left, Step-through pattern, Trunk flexed       General Gait Details: Forward flexed posture throughout ambulation. Used pt's personal SPC during evaluation. 1 LOB while turning requiring min A for correction.  Stairs            Wheelchair Mobility     Tilt Bed    Modified Rankin (Stroke Patients Only)       Balance Overall balance assessment: Needs assistance Sitting-balance support: Feet supported Sitting balance-Leahy Scale: Good Sitting balance - Comments: Able to maintain seated balance at EOB   Standing balance support: Single extremity supported, During functional activity, Reliant on assistive device for balance Standing balance-Leahy Scale: Poor Standing balance comment: Pt able to stand however with forward flexed posture. Reliant on [facility] for balance and would be more balanced with use of RW.  Pertinent Vitals/Pain Pain Assessment Pain Assessment: No/denies pain    Home Living Family/patient expects to be discharged to:: Private residence Living Arrangements: Alone Available Help at Discharge: Family;Available PRN/intermittently (sister in [place]) Type of Home:  House Home Access: Level entry       Home Layout: One level Home Equipment: Rollator (4 wheels);Rolling Walker (2 wheels);Cane - single point      Prior Function Prior Level of Function : Needs assist;Driving             Mobility Comments: Mod I using SPC in community. Doesn't use cane when at home. Reports increased difficulty with ambulating on uneven surfaces. ADLs Comments: independent with ADLs, however he reports diffculty with LBDs     Extremity/Trunk Assessment   Upper Extremity Assessment Upper Extremity Assessment: Generalized weakness    Lower Extremity Assessment Lower Extremity Assessment: Generalized weakness    Cervical / Trunk Assessment Cervical / Trunk Assessment: Kyphotic  Communication   Communication Communication: No apparent difficulties    Cognition Arousal: Alert Behavior During Therapy: WFL for tasks assessed/performed   PT - Cognitive impairments: No apparent impairments                       PT - Cognition Comments: pleasant and agreeable to PT session Following commands: Intact       Cueing Cueing Techniques: Verbal cues, Visual cues     General Comments General comments (skin integrity, edema, etc.): HR 85bpm at most. SpO2 WNL throughout session    Exercises Other Exercises Other Exercises: Edu about use of RW while at hospital   Assessment/Plan    PT Assessment Patient needs continued PT services  PT Problem List Decreased strength;Decreased mobility;Decreased activity tolerance;Cardiopulmonary status limiting activity;Decreased balance;Decreased knowledge of use of DME       PT Treatment Interventions DME instruction;Gait training;Functional mobility training;Therapeutic activities;Patient/family education;Neuromuscular re-education;Balance training;Therapeutic exercise    PT Goals (Current goals can be found in the Care Plan section)  Acute Rehab PT Goals Patient Stated Goal: I need more help at home PT  Goal Formulation: With patient Time For Goal Achievement: 01/18/24 Potential to Achieve Goals: Fair    Frequency Min 2X/week     Co-evaluation               AM-PAC PT 6 Clicks Mobility  Outcome Measure Help needed turning from your back to your side while in a flat bed without using bedrails?: None Help needed moving from lying on your back to sitting on the side of a flat bed without using bedrails?: A Little Help needed moving to and from a bed to a chair (including a wheelchair)?: A Little Help needed standing up from a chair using your arms (e.g., wheelchair or bedside chair)?: A Little Help needed to walk in hospital room?: A Little Help needed climbing 3-5 steps with a railing? : A Lot 6 Click Score: 18    End of Session Equipment Utilized During Treatment: Gait belt Activity Tolerance: Patient tolerated treatment well Patient left: in chair;with call bell/phone within reach;with chair alarm set Nurse Communication: Mobility status PT Visit Diagnosis: Other abnormalities of gait and mobility (R26.89);Muscle weakness (generalized) (M62.81)    Time: 8982-8965 PT Time Calculation (min) (ACUTE ONLY): 17 min   Charges:                 Cameron Foster, SPT   Cameron Foster 01/04/2024, 12:19 PM

## 2024-01-04 NOTE — Progress Notes (Signed)
 Mobility Specialist - Progress Note   01/04/24 1228  Mobility  Activity Stood at bedside;Pivoted/transferred from chair to bed  Level of Assistance Contact guard assist, steadying assist  Assistive Device Front wheel walker  Distance Ambulated (ft) 4 ft  Activity Response Tolerated well  Mobility visit 1 Mobility  Mobility Specialist Start Time (ACUTE ONLY) 1213  Mobility Specialist Stop Time (ACUTE ONLY) 1223  Mobility Specialist Time Calculation (min) (ACUTE ONLY) 10 min   Pt sitting in the recliner upon entry, utilizing RA--- expressing neck pain. Pt STS to RW MinA-CGA, stood for ~2 mins utilizing the urinal. Pt transferred to bed via SPT CGA, tolerated well. Pt left supine with alarm set and needs within reach. RN notified.

## 2024-01-04 NOTE — Plan of Care (Signed)
  Problem: Education: Goal: Knowledge of General Education information will improve Description: Including pain rating scale, medication(s)/side effects and non-pharmacologic comfort measures Outcome: Not Progressing   Problem: Health Behavior/Discharge Planning: Goal: Ability to manage health-related needs will improve Outcome: Not Progressing   Problem: Clinical Measurements: Goal: Ability to maintain clinical measurements within normal limits will improve Outcome: Not Progressing Goal: Will remain free from infection Outcome: Not Progressing Goal: Diagnostic test results will improve Outcome: Not Progressing Goal: Respiratory complications will improve Outcome: Not Progressing Goal: Cardiovascular complication will be avoided Outcome: Not Progressing   Problem: Activity: Goal: Risk for activity intolerance will decrease Outcome: Not Progressing   Problem: Nutrition: Goal: Adequate nutrition will be maintained Outcome: Not Progressing   Problem: Coping: Goal: Level of anxiety will decrease Outcome: Not Progressing   Problem: Elimination: Goal: Will not experience complications related to bowel motility Outcome: Not Progressing Goal: Will not experience complications related to urinary retention Outcome: Not Progressing   Problem: Pain Managment: Goal: General experience of comfort will improve and/or be controlled Outcome: Not Progressing   Problem: Safety: Goal: Ability to remain free from injury will improve Outcome: Not Progressing   Problem: Skin Integrity: Goal: Risk for impaired skin integrity will decrease Outcome: Not Progressing   Problem: Education: Goal: Ability to demonstrate management of disease process will improve Outcome: Not Progressing Goal: Ability to verbalize understanding of medication therapies will improve Outcome: Not Progressing Goal: Individualized Educational Video(s) Outcome: Not Progressing   Problem: Activity: Goal:  Capacity to carry out activities will improve Outcome: Not Progressing   Problem: Cardiac: Goal: Ability to achieve and maintain adequate cardiopulmonary perfusion will improve Outcome: Not Progressing   Problem: Education: Goal: Knowledge of disease or condition will improve Outcome: Not Progressing Goal: Understanding of medication regimen will improve Outcome: Not Progressing Goal: Individualized Educational Video(s) Outcome: Not Progressing   Problem: Activity: Goal: Ability to tolerate increased activity will improve Outcome: Not Progressing   Problem: Cardiac: Goal: Ability to achieve and maintain adequate cardiopulmonary perfusion will improve Outcome: Not Progressing   Problem: Health Behavior/Discharge Planning: Goal: Ability to safely manage health-related needs after discharge will improve Outcome: Not Progressing

## 2024-01-04 NOTE — Anesthesia Postprocedure Evaluation (Signed)
 Anesthesia Post Note  Patient: Jody Silas.  Procedure(s) Performed: ECHOCARDIOGRAM, TRANSESOPHAGEAL CARDIOVERSION  Patient location during evaluation: Specials Recovery Anesthesia Type: General Level of consciousness: awake and alert Pain management: pain level controlled Vital Signs Assessment: post-procedure vital signs reviewed and stable Respiratory status: spontaneous breathing, nonlabored ventilation, respiratory function stable and patient connected to nasal cannula oxygen Cardiovascular status: blood pressure returned to baseline and stable Postop Assessment: no apparent nausea or vomiting Anesthetic complications: no   No notable events documented.   Last Vitals:  Vitals:   01/04/24 0428 01/04/24 0914  BP: (!) 140/88 124/76  Pulse: 67 78  Resp: 18 16  Temp: 36.9 C 37.1 C  SpO2: 93% 97%    Last Pain:  Vitals:   01/04/24 1144  TempSrc:   PainSc: 3                  Prentice Murphy

## 2024-01-04 NOTE — Care Management Important Message (Signed)
 Important Message  Patient Details  Name: Cameron Foster. MRN: [id number] Date of Birth: [birth year]   Important Message Given:  Yes - Medicare IM     [name] 01/04/2024, 11:43 AM

## 2024-01-04 NOTE — TOC Initial Note (Signed)
 Transition of Care (TOC) - Initial/Assessment Note    Patient Details  Name: Cameron Foster. MRN: [id number] Date of Birth: Dec 02, 1940  Transition of Care [facility]) CM/SW Contact:    Corean ONEIDA Haddock, RN Phone Number: 01/04/2024, 4:12 PM  Clinical Narrative:                  Patient to discharge today Therapy recs for [facility] Patient in agreement and states he does not have a preferance of agency.  Referral made and accepted by Shaun with [facility] Patient states that sister will transport at discharge        Patient Goals and CMS Choice            Expected Discharge Plan and Services         Expected Discharge Date: 01/04/24                                    Prior Living Arrangements/Services                       Activities of Daily Living   ADL Screening (condition at time of admission) Independently performs ADLs?: No Does the patient have a NEW difficulty with bathing/dressing/toileting/self-feeding that is expected to last >3 days?: No Does the patient have a NEW difficulty with getting in/out of bed, walking, or climbing stairs that is expected to last >3 days?: No Does the patient have a NEW difficulty with communication that is expected to last >3 days?: No Is the patient deaf or have difficulty hearing?: No Does the patient have difficulty seeing, even when wearing glasses/contacts?: No Does the patient have difficulty concentrating, remembering, or making decisions?: No  Permission Sought/Granted                  Emotional Assessment              Admission diagnosis:  Atrial fibrillation with rapid ventricular response (HCC) [I48.91] Atrial fibrillation with RVR (HCC) [I48.91] Chest pain, unspecified type [R07.9] Congestive heart failure, unspecified HF chronicity, unspecified heart failure type [facility]) [I50.9] Patient Active Problem List   Diagnosis Date Noted   Dilated cardiomyopathy (HCC) 01/03/2024   Alcohol  use disorder 01/01/2024   Chest pain 01/01/2024   Atrial fibrillation with RVR (HCC) 12/21/2023   Hypokalemia 12/21/2023   History of colitis 12/21/2023   History of GI bleed 12/21/2023   OSA (obstructive sleep apnea) 03/27/2023   Prediabetes 12/26/2022   Facial trauma, initial encounter 12/22/2022   Lumbar radiculopathy 10/24/2022   Congestive heart failure (HCC) 05/04/2022   Urinary problem 05/04/2022   Post-void dribbling 04/25/2022   Penile atrophy 04/25/2022   Bilateral leg weakness 04/25/2022   Gait abnormality 04/25/2022   Ataxia, 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   Tobacco abuse 12/21/2021   Chronic bronchitis (HCC) 10/13/2021   Presence of right artificial hip joint 09/30/2021   Long term (current) use of anticoagulants 09/30/2021   Atherosclerosis of coronary artery without angina pectoris 09/30/2021   Allergic rhinitis 09/30/2021   Diverticular disease 09/30/2021   Gastroesophageal reflux disease without esophagitis 09/30/2021   Irritable bowel syndrome 09/30/2021   Malignant tumor of prostate (HCC) 09/30/2021   Malignant neoplasm of skin 09/30/2021   Vitamin B deficiency 09/30/2021   History of total hip replacement, right 09/27/2021   Atrial fibrillation with rapid ventricular response (HCC)  Acute on chronic HFrEF (heart failure with reduced ejection fraction) (HCC) 06/26/2021   Left sided colitis (HCC) 06/23/2021   Actinic keratosis 05/24/2021   Squamous cell cancer of skin of left forearm 05/24/2021   Chronic gastric ulcer with hemorrhage 02/25/2021   Irregular Z line of esophagus 02/25/2021   Chronic pansinusitis 01/26/2021   Dizziness 01/26/2021   Melena 01/13/2021   Alcoholism (HCC) 01/13/2021   Stage 3a chronic kidney disease (HCC) 11/23/2020   Chronic pain of right ankle 05/21/2020   Achilles tendinitis 04/30/2020   Synovitis and tenosynovitis 04/30/2020   Lymphedema 03/03/2020   Chronic venous insufficiency 03/03/2020   Debility  02/14/2020   Leg edema 02/14/2020   Foraminal stenosis of lumbar region 11/08/2019   Chronic bilateral low back pain without sciatica 10/18/2019   Foraminal stenosis of cervical region 10/18/2019   History of lacunar cerebrovascular accident (CVA) 07/10/2019   History of stroke 07/01/2019   TIA (transient ischemic attack) 10/20/2018   History of subarachnoid hemorrhage 10/16/2018   Low libido 08/30/2018   Obesity 05/14/2018   Hyperlipidemia LDL goal <70 05/14/2018   Degeneration of lumbar intervertebral disc 03/22/2018   Osteoarthritis of hip 03/22/2018   Trochanteric bursitis of right hip 03/22/2018   History of colostomy reversal 03/21/2017   Status post partial colectomy 01/03/2017   Depression, major, single episode, mild (HCC) 01/03/2017   Vasovagal syncope 01/27/2016   History of skin cancer 10/28/2015   Osteoarthritis of right knee 10/01/2015   Essential hypertension 08/25/2015   Shortness of breath 08/12/2015   Erectile dysfunction following radiation therapy 08/04/2015   Constipation 08/02/2015   History of prostate cancer 01/20/2015   Grief 01/20/2015   Swelling of right lower extremity 01/20/2015   PCP:  Myrla Jon [name], MD Pharmacy:   [facility] [phone] [address], [place] - [address] [place] [address] Phone: [phone] Fax: [phone]  [facility] [address] [place] [address] Phone: [phone] Fax: [phone]     Social Drivers of Health (SDOH) Social History: SDOH Screenings   Food Insecurity: Unknown (01/02/2024)  Housing: Unknown (01/02/2024)  Transportation Needs: No Transportation Needs (01/02/2024)  Utilities: Not At Risk (01/01/2024)  Alcohol Screen: Low Risk  (12/11/2023)  Depression (PHQ2-9): Medium Risk (12/26/2023)  Financial Resource Strain: Low Risk  (01/02/2024)  Physical Activity: Inactive (11/24/2023)  Social Connections: Moderately Integrated  (01/01/2024)  Stress: Stress Concern Present (11/24/2023)  Tobacco Use: High Risk (01/03/2024)  Health Literacy: Adequate Health Literacy (05/31/2023)   SDOH Interventions: Food Insecurity Interventions: Intervention Not Indicated Housing Interventions: Intervention Not Indicated Transportation Interventions: Intervention Not Indicated Financial Strain Interventions: Intervention Not Indicated   Readmission Risk Interventions     No data to display

## 2024-01-04 NOTE — Progress Notes (Signed)
 Heart Failure Stewardship Pharmacy Note  PCP: Myrla Jon [name], MD PCP-Cardiologist: Lonni Hanson, MD  HPI: Cameron Foster. is a 83 y.o. male with paroxysmal A-fib, ascending aortic aneurysm, prior GI bleed, prior subarachnoid hemorrhage, CVA, alcohol use, basal cell carcinoma, hyperlipidemia, hypertension, depression, anxiety, diastolic CHF who presented with chest pain. On admission, BNP was 523.8, HS-troponin was 14, and normal LFTs. Chest x-ray noted vascular congestion without edema.    Pertinent cardiac history: Normal stress test 09/2015. Normal LVEF on TTEs in 2017, 2019, and 2021. TTE 06/2021 with LVEF of 45-50%, moderate LVH, mild MR, and mild dilation of the aortic root. TTE 04/2022 with LVEF 55-60%, G2DD. LVEF unchanged on TTE 12/2023. Underwent TEE/DCCV 01/03/24 where LVEF was found to be 30-35%.   Pertinent Lab Values: Creat  Date Value Ref Range Status  08/12/2015 1.40 (H) 0.70 - 1.18 mg/dL Final   Creatinine, Ser  Date Value Ref Range Status  01/04/2024 1.53 (H) 0.61 - 1.24 mg/dL Final   BUN  Date Value Ref Range Status  01/04/2024 21 8 - 23 mg/dL Final  90/90/7974 27 8 - 27 mg/dL Final   Potassium  Date Value Ref Range Status  01/04/2024 4.2 3.5 - 5.1 mmol/L Final   Sodium  Date Value Ref Range Status  01/04/2024 136 135 - 145 mmol/L Final  12/26/2023 140 134 - 144 mmol/L Final   Brain Natriuretic Peptide  Date Value Ref Range Status  08/12/2015 117.3 (H) <100 pg/mL Final    Comment:      BNP levels increase with age in the general population with the highest values seen in individuals greater than 39 years of age. Reference: JINNY [employer] Coulter Cardiol 2002; 59:023-17.      B Natriuretic Peptide  Date Value Ref Range Status  01/01/2024 523.8 (H) 0.0 - 100.0 pg/mL Final    Comment:    Performed at [facility], [address] [place], [place] [address]   Magnesium   Date Value Ref Range Status  01/04/2024 2.2 1.7 - 2.4 mg/dL Final     Comment:    Performed at [facility], [address] [place], [place] [address]   Hemoglobin A1C  Date Value Ref Range Status  10/11/2018 5.3  Final   Hgb A1c MFr Bld  Date Value Ref Range Status  03/27/2023 5.6 4.8 - 5.6 % Final    Comment:             Prediabetes: 5.7 - 6.4          Diabetes: >6.4          Glycemic control for adults with diabetes: <7.0    Digoxin  Level  Date Value Ref Range Status  07/01/2021 1.1 0.8 - 2.0 ng/mL Final    Comment:    Performed at [facility], [address] [place], [place] [address]   TSH  Date Value Ref Range Status  12/21/2023 4.023 0.350 - 4.500 uIU/mL Final    Comment:    Performed by a 3rd Generation assay with a functional sensitivity of <=0.01 uIU/mL. Performed at [facility], [address] [place], [place] [address]   01/20/2015 1.88 0.35 - 4.50 uIU/mL Final    Vital Signs:  Temp:  [97.3 F (36.3 C)-98.7 F (37.1 C)] 98.4 F (36.9 C) (09/18 0428) Pulse Rate:  [54-103] 67 (09/18 0428) Cardiac Rhythm: Bundle branch block;Heart block (09/18 0700) Resp:  [6-23] 18 (09/18 0428) BP: (87-149)/(56-106) 140/88 (09/18 0428) SpO2:  [92 %-  100 %] 93 % (09/18 0428) Weight:  [95.2 kg (209 lb 14.1 oz)] 95.2 kg (209 lb 14.1 oz) (09/18 0703)  Intake/Output Summary (Last 24 hours) at 01/04/2024 0805 Last data filed at 01/04/2024 0500 Gross per 24 hour  Intake 340 ml  Output 350 ml  Net -10 ml    Current Heart Failure Medications:  Loop diuretic: none Beta-Blocker: metoprolol  succinate 100 mg daily ACEI/ARB/ARNI: losartan  25 mg daily MRA: none SGLT2i: none Other: none  Prior to admission Heart Failure Medications:  Loop diuretic: none Beta-Blocker: metoprolol  tartrate 75 mg BID ACEI/ARB/ARNI: lisinopril  10 mg daily MRA: none SGLT2i: none Other: diltiazem  ER 120 mg daily, hydrochlorothiazide  12.5 mg daily, amlodipine  5 mg daily  Assessment: 1. Acute on chronic diastolic heart failure (LVEF  44-39%) with G2DD, due to NICM. NYHA class II-III symptoms.  -Symptoms: Reports dyspnea on exertion and LEE at baseline. No other complaints. -Volume: Difficult to assess volume. Creatinine trending up slightly -Hemodynamics: BP is elevated. Robust antihypertensive regimen at home. Would recommend discontinuing amlodipine  and hydrochlorothiazide  in favor of GDMT at discharge. HR elevated, cardiology planning DCCV today. -SGLT2i: Consider starting SGLT2i. Has high deductible, but would likely qualify for a grant. -MRA: Consider adding spironolactone 12.5 mg daily tomorrow vs outpatient. Will likely need gentle diuresis to replace hydrochlorothiazide . -ARNI: Can consider adding pending BP on losartan .  Plan: 1) Medication changes recommended at this time: -Agree with losartan  25 mg daily  2) Patient assistance: -Copays are high due to $590 deductible  3) Education: - Patient has been educated on current HF medications and potential additions to HF medication regimen - Patient verbalizes understanding that over the next few months, these medication doses may change and more medications may be added to optimize HF regimen - Patient has been educated on basic disease state pathophysiology and goals of therapy  Medication Assistance / Insurance Benefits Check: Does the patient have prescription insurance?    Type of insurance plan:  Does the patient qualify for medication assistance through manufacturers or grants? Pending   Outpatient Pharmacy: Prior to admission outpatient pharmacy: [facility]      Please do not hesitate to reach out with questions or concerns,  Jaun Bash, PharmD, CPP, BCPS, [facility] Heart Failure Pharmacist  Phone - [phone] 01/04/2024 8:05 AM

## 2024-01-04 NOTE — Plan of Care (Signed)

## 2024-01-05 ENCOUNTER — Telehealth: Payer: Self-pay

## 2024-01-05 ENCOUNTER — Other Ambulatory Visit: Payer: Self-pay

## 2024-01-05 DIAGNOSIS — C61 Malignant neoplasm of prostate: Secondary | ICD-10-CM | POA: Diagnosis not present

## 2024-01-05 DIAGNOSIS — I130 Hypertensive heart and chronic kidney disease with heart failure and stage 1 through stage 4 chronic kidney disease, or unspecified chronic kidney disease: Secondary | ICD-10-CM | POA: Diagnosis not present

## 2024-01-05 DIAGNOSIS — E876 Hypokalemia: Secondary | ICD-10-CM | POA: Diagnosis not present

## 2024-01-05 DIAGNOSIS — I480 Paroxysmal atrial fibrillation: Secondary | ICD-10-CM | POA: Diagnosis not present

## 2024-01-05 DIAGNOSIS — I7121 Aneurysm of the ascending aorta, without rupture: Secondary | ICD-10-CM | POA: Diagnosis not present

## 2024-01-05 DIAGNOSIS — I5042 Chronic combined systolic (congestive) and diastolic (congestive) heart failure: Secondary | ICD-10-CM | POA: Diagnosis not present

## 2024-01-05 DIAGNOSIS — C44629 Squamous cell carcinoma of skin of left upper limb, including shoulder: Secondary | ICD-10-CM | POA: Diagnosis not present

## 2024-01-05 DIAGNOSIS — F1020 Alcohol dependence, uncomplicated: Secondary | ICD-10-CM | POA: Diagnosis not present

## 2024-01-05 DIAGNOSIS — R7303 Prediabetes: Secondary | ICD-10-CM | POA: Diagnosis not present

## 2024-01-05 DIAGNOSIS — N1831 Chronic kidney disease, stage 3a: Secondary | ICD-10-CM | POA: Diagnosis not present

## 2024-01-05 DIAGNOSIS — E785 Hyperlipidemia, unspecified: Secondary | ICD-10-CM | POA: Diagnosis not present

## 2024-01-05 DIAGNOSIS — J42 Unspecified chronic bronchitis: Secondary | ICD-10-CM | POA: Diagnosis not present

## 2024-01-05 DIAGNOSIS — M5416 Radiculopathy, lumbar region: Secondary | ICD-10-CM | POA: Diagnosis not present

## 2024-01-05 NOTE — Telephone Encounter (Signed)
 Pt's relative called stating that the pt had slurred speech, imbalanced and has been lethargic. Bp 101/78. Heart rate bouncing from 94-116. Advised that pt report to ED for signs of stroke.

## 2024-01-05 NOTE — Patient Instructions (Signed)
 Visit Information  Thank you for taking time to visit with me today. Please don't hesitate to contact me if I can be of assistance to you before our next scheduled telephone appointment.  Our next appointment is by telephone on 01/12/24 at 1430  Following is a copy of your care plan:   Goals Addressed             This Visit's Progress    VBCI Transitions of Care (TOC) Care Plan       Problems:  Recent Hospitalization for treatment of Atrial Fibrillation 01/05/24 Heart Failure Patient needs ongoing assistance for ADLs sister states PCP made a referral for this. Discussed that it may be an out of pocket cost and states they are waiting for a callback from the MDs referral made today 12/26/23  Goal:  Over the next 30 days, the patient will not experience hospital readmission  Interventions:   AFIB Interventions:   Advised patient to discuss worsening symptoms with provider Continue to take medications as prescribed and follow up with [facility] regarding managing packing the medication DC instructions reviewed with sister, Norvel on AVS: Call MD for: difficulty breathing, headache or visual disturbances Call MD for: persistant dizziness or light-headedness Call MD for: persistant nausea and vomiting Call MD for: severe uncontrolled pain Call MD for: temperature >100.4 Diet general Discharge instructions Follow up with your primary care physician to discuss the medication changes during this admission. Continue close follow up with cardiology. Use your albuterol  inhaler every 4 hours as needed. Mucinex  DM for coughing and mucus production. Do NOT use Mucinex  D.  01/05/19 Review HF instructions: Record daily weight on same scale at same time of day. Reviewed last weight at hospital 209.14 oz today 212 lb at home noted.  Patient Self Care Activities:  Attend all scheduled provider appointments Call pharmacy for medication refills 3-7 days in advance of running  out of medications Call provider office for new concerns or questions  Notify RN Care Manager of TOC call rescheduling needs Participate in Transition of Care Program/Attend TOC scheduled calls Take medications as prescribed    Plan:  The care management team will reach out to the patient again over the next 5-10 business days. The patient has been provided with contact information for the care management team and has been advised to call with any health related questions or concerns. Appointment made for next week for enrolled in [facility] as patient and sister requested to end call but made appointment for next week follow up.        Patient verbalizes understanding of instructions and care plan provided today and agrees to view in MyChart. Active MyChart status and patient understanding of how to access instructions and care plan via MyChart confirmed with patient.     The patient has been provided with contact information for the care management team and has been advised to call with any health related questions or concerns.   Please call the care guide team at [phone] if you need to cancel or reschedule your appointment.   Please call the Suicide and Crisis Lifeline: 988 call the [place]  National Suicide Prevention Lifeline: (813)183-0262 or TTY: 201-032-8944 TTY 4305230673) to talk to a trained counselor call 1-800-273-TALK (toll free, 24 hour hotline) call 911 if you are experiencing a Mental Health or Behavioral Health Crisis or need someone to talk to.  [name], RN, BSN, CCM [employer], [facility] RN Care Manager Direct  Dial: [phone]

## 2024-01-05 NOTE — Progress Notes (Signed)
 I have reviewed the case manager/PharmD/LCSW's note, was available for consultation, and agree with documentation and plan  Donzella Lauraine LOISE ROSALEA, DO [facility] 01/05/2024 4:45 PM

## 2024-01-05 NOTE — Transitions of Care (Post Inpatient/ED Visit) (Signed)
 01/05/2024  Name: Cameron Foster. MRN: [id number] DOB: 1940/12/09  Today's TOC FU Call Status: Today's TOC FU Call Status:: Successful TOC FU Call Completed TOC FU Call Complete Date: 01/05/24 Patient's Name and Date of Birth confirmed.  Transition Care Management Follow-up Telephone Call Date of Discharge: 01/04/24 Discharge Facility: [facility]) Type of Discharge: Inpatient Admission Primary Inpatient Discharge Diagnosis:: Atrial Fib; Heart Failure How have you been since you were released from the hospital?: Same (Interview with sister Norvel, per patient permission) Any questions or concerns?: No  Items Reviewed: Did you receive and understand the discharge instructions provided?: Yes Medications obtained,verified, and reconciled?: Yes (Medications Reviewed) Any new allergies since your discharge?: No Dietary orders reviewed?: Yes Type of Diet Ordered:: No sodium heart healthy Do you have support at home?: Yes People in Home [RPT]: alone Name of Support/Comfort Primary Source: sister, Norvel assisting - retired Engineer, civil (consulting)  Medications Reviewed Today: Medications Reviewed Today     Reviewed by Eilleen Richerd GRADE, RN (Registered Nurse) on 01/05/24 at 1639  Med List Status: <None>   Medication Order Taking? Sig Documenting Provider Last Dose Status Informant  albuterol  (VENTOLIN  HFA) 108 (90 Base) MCG/ACT inhaler [id number]  Inhale 2 puffs into the lungs every 6 (six) hours as needed for wheezing or shortness of breath. Levander Slate, MD  Active Self, Other           Med Note LESLY, RICHERD GRADE Debar Dec 26, 2023  2:22 PM) Patient has on hand but not taking until needed  Alpha-Lipoic Acid 600 MG TABS [id number]  Take 1 tablet by mouth daily. [provider]  Active Self, Other  amiodarone  (PACERONE ) 200 MG tablet [id number]  Take 2 tablets (400 mg total) by mouth 2 (two) times daily for 7 days, THEN 1 tablet (200 mg total) 2 (two) times daily.  Dezii, Alexandra, DO  Active   apixaban  (ELIQUIS ) 5 MG TABS tablet [id number] No Take 1 tablet (5 mg total) by mouth 2 (two) times daily. Caleen Qualia, MD 12/31/2023 Morning Active Self, Other  atorvastatin  (LIPITOR) 40 MG tablet [id number]  TAKE 1 TABLET BY MOUTH EVERY DAY Bacigalupo, Jon [name], MD  Active Self, Other  cetirizine  (ZYRTEC ) 10 MG tablet [id number]  Take 10 mg by mouth daily. [provider]  Active Self, Other  Cholecalciferol (VITAMIN D-3) 25 MCG (1000 UT) CAPS [id number]  Take 1,000 Units by mouth daily. [provider]  Active Self, Other  docusate sodium  (COLACE) 100 MG capsule [id number]  Take 1 capsule (100 mg total) by mouth 2 (two) times daily.  Patient taking differently: Take 200 mg by mouth daily.   Joshua Lin, PA-C  Active Self, Other           Med Note CUCCIA, CONNELLAE H   Thu Dec 21, 2023  7:30 AM)    fluticasone  (FLONASE ) 50 MCG/ACT nasal spray [id number]  PLACE 1 SPRAY INTO BOTH NOSTRILS DAILY AS NEEDED FOR ALLERGIES OR RHINITIS. Myrla Jon [name], MD  Active Self, Other  folic acid  (FOLVITE ) 1 MG tablet [id number]  Take 1 tablet (1 mg total) by mouth daily. Caleen Qualia, MD  Active Self, Other  hydrocortisone  2.5 % cream [id number]  APPLY TO AFFECTED AREA TWICE A DAY AS NEEDED FOR RASH Bacigalupo, Jon [name], MD  Active Self, Other  ketoconazole  (NIZORAL ) 2 % cream [id number]  Apply once or twice daily to affected skin folds as needed for rash Jackquline Sawyer, MD  Active Self, Other  losartan  (COZAAR ) 25 MG tablet [id number]  Take 1 tablet (25 mg total) by mouth daily. Dezii, Alexandra, DO  Active   metoprolol  succinate (TOPROL -XL) 100 MG 24 hr tablet [id number]  Take 1 tablet (100 mg total) by mouth daily. Take with or immediately following a meal. Dezii, Alexandra, DO  Active   Multiple Vitamin (MULTIVITAMIN WITH MINERALS) TABS tablet [id number]  Take 1 tablet by mouth daily. Vachhani, Vaibhavkumar, MD  Active Self, Other  omeprazole  (PRILOSEC) 20 MG  capsule [id number]  TAKE 1 CAPSULE (20 MG TOTAL) BY MOUTH DAILY. REPORTS CURRENTLY TAKING AS NEEDED Bacigalupo, Jon [name], MD  Active Self, Other  pregabalin  (LYRICA ) 25 MG capsule [id number]  Take 25 mg by mouth 2 (two) times daily. [provider]  Active Self, Other  sertraline  (ZOLOFT ) 100 MG tablet [id number]  Take 1 tablet (100 mg total) by mouth daily. Myrla Jon [name], MD  Active Self, Other  tadalafil  (CIALIS ) 5 MG tablet [id number]  Take 1 tablet (5 mg total) by mouth daily as needed for erectile dysfunction. Bacigalupo, Angela M, MD  Active Self, Other           Med Note SIMMIE, RAVEN M   Fri Jul 07, 2023  4:09 PM) Aware to take EVERY OTHER DAY  tadalafil  (CIALIS ) 5 MG tablet [id number]  Take 1 tablet (5 mg total) by mouth every other day as needed. Donzella Lauraine SAILOR, DO  Active Self, Other  thiamine  (VITAMIN B1) 100 MG tablet [id number]  Take 100 mg by mouth daily. [provider]  Active Self, Other  vitamin B-12 (CYANOCOBALAMIN ) 1000 MCG tablet [id number]  Take 1,000 mcg by mouth daily. [provider]  Active Self, Other  zinc gluconate 50 MG tablet [id number]  Take 50 mg by mouth daily. [provider]  Active Self, Other            Home Care and Equipment/Supplies: Were Home Health Services Ordered?: Yes Name of Home Health Agency:: [facility] Has Agency set up a time to come to your home?: Yes First Home Health Visit Date: 01/05/24 Any new equipment or medical supplies ordered?: NA  Functional Questionnaire: Do you need assistance with bathing/showering or dressing?: Yes Do you need assistance with meal preparation?: Yes Do you need assistance with eating?: No Do you have difficulty maintaining continence: No Do you need assistance with getting out of bed/getting out of a chair/moving?: Yes Do you have difficulty managing or taking your medications?:  (sister is helping patient manage medications and medication changes as noted.)  Follow  up appointments reviewed: PCP Follow-up appointment confirmed?: Yes (Appointment arranged today) Date of PCP follow-up appointment?: 01/11/24 Follow-up Provider: Myrla Jon Specialist [facility] Follow-up appointment confirmed?: Yes Date of Specialist follow-up appointment?: 01/09/24 Follow-Up Specialty Provider:: Heart Failure Do you need transportation to your follow-up appointment?: No Do you understand care options if your condition(s) worsen?: Yes-patient verbalized understanding  SDOH Interventions Today    Flowsheet Row Most Recent Value  SDOH Interventions   Food Insecurity Interventions Intervention Not Indicated  Housing Interventions Intervention Not Indicated  Transportation Interventions Intervention Not Indicated  Utilities Interventions Intervention Not Indicated    Goals Addressed             This Visit's Progress    VBCI Transitions of Care (TOC) Care Plan       Problems:  Recent Hospitalization for treatment of Atrial Fibrillation 01/05/24 Heart Failure Patient needs ongoing assistance for ADLs sister states PCP made a referral for this. Discussed  that it may be an out of pocket cost and states they are waiting for a callback from the MDs referral made today 12/26/23  Goal:  Over the next 30 days, the patient will not experience hospital readmission  Interventions:   AFIB Interventions:   Advised patient to discuss worsening symptoms with provider Continue to take medications as prescribed and follow up with [facility] regarding managing packing the medication DC instructions reviewed with sister, Norvel on AVS: Call MD for: difficulty breathing, headache or visual disturbances Call MD for: persistant dizziness or light-headedness Call MD for: persistant nausea and vomiting Call MD for: severe uncontrolled pain Call MD for: temperature >100.4 Diet general Discharge instructions Follow up with your primary care physician to  discuss the medication changes during this admission. Continue close follow up with cardiology. Use your albuterol  inhaler every 4 hours as needed. Mucinex  DM for coughing and mucus production. Do NOT use Mucinex  D.  01/05/19 Review HF instructions: Record daily weight on same scale at same time of day. Reviewed last weight at hospital 209.14 oz today 212 lb at home noted.  Patient Self Care Activities:  Attend all scheduled provider appointments Call pharmacy for medication refills 3-7 days in advance of running out of medications Call provider office for new concerns or questions  Notify RN Care Manager of TOC call rescheduling needs Participate in Transition of Care Program/Attend TOC scheduled calls Take medications as prescribed    Plan:  The care management team will reach out to the patient again over the next 5-10 business days. The patient has been provided with contact information for the care management team and has been advised to call with any health related questions or concerns. Appointment made for next week for enrolled in [facility] as patient and sister requested to end call but made appointment for next week follow up.         [name], RN, BSN, CCM [facility], [facility] RN Care Manager Direct Dial: [phone]

## 2024-01-05 NOTE — Telephone Encounter (Signed)
 Copied from CRM #[id number]. Topic: Clinical - Home Health Verbal Orders >> Jan 05, 2024  2:38 PM Everette C wrote: Caller/Agency: [facility] Number: [phone] Service Requested: Physical Therapy Frequency: 1w8 Any new concerns about the patient? No

## 2024-01-08 ENCOUNTER — Telehealth: Payer: Self-pay | Admitting: Family

## 2024-01-08 NOTE — Telephone Encounter (Signed)
 Called to confirm/remind patient of their appointment [facility] on 01/09/24.   Appointment:   [x] Confirmed  [] Left mess   [] No answer/No voice mail  [] VM Full/unable to leave message  [] Phone not in service  Patient reminded to bring all medications and/or complete list.  Confirmed patient has transportation. Gave directions, instructed to utilize valet parking.

## 2024-01-08 NOTE — Telephone Encounter (Signed)
 OK for verbals

## 2024-01-08 NOTE — Telephone Encounter (Signed)
Verbals given  

## 2024-01-09 ENCOUNTER — Other Ambulatory Visit: Payer: Self-pay

## 2024-01-09 ENCOUNTER — Ambulatory Visit: Attending: Family | Admitting: Family

## 2024-01-09 ENCOUNTER — Encounter: Payer: Self-pay | Admitting: Family

## 2024-01-09 ENCOUNTER — Other Ambulatory Visit: Payer: Self-pay | Admitting: Family Medicine

## 2024-01-09 VITALS — BP 112/83 | HR 94 | Wt 221.0 lb

## 2024-01-09 DIAGNOSIS — E8770 Fluid overload, unspecified: Secondary | ICD-10-CM | POA: Diagnosis not present

## 2024-01-09 DIAGNOSIS — E785 Hyperlipidemia, unspecified: Secondary | ICD-10-CM

## 2024-01-09 DIAGNOSIS — F17220 Nicotine dependence, chewing tobacco, uncomplicated: Secondary | ICD-10-CM | POA: Insufficient documentation

## 2024-01-09 DIAGNOSIS — N1831 Chronic kidney disease, stage 3a: Secondary | ICD-10-CM

## 2024-01-09 DIAGNOSIS — I700 Atherosclerosis of aorta: Secondary | ICD-10-CM | POA: Insufficient documentation

## 2024-01-09 DIAGNOSIS — I4819 Other persistent atrial fibrillation: Secondary | ICD-10-CM | POA: Diagnosis not present

## 2024-01-09 DIAGNOSIS — I4510 Unspecified right bundle-branch block: Secondary | ICD-10-CM | POA: Insufficient documentation

## 2024-01-09 DIAGNOSIS — G4733 Obstructive sleep apnea (adult) (pediatric): Secondary | ICD-10-CM

## 2024-01-09 DIAGNOSIS — R0601 Orthopnea: Secondary | ICD-10-CM | POA: Insufficient documentation

## 2024-01-09 DIAGNOSIS — F1910 Other psychoactive substance abuse, uncomplicated: Secondary | ICD-10-CM

## 2024-01-09 DIAGNOSIS — Z79899 Other long term (current) drug therapy: Secondary | ICD-10-CM | POA: Diagnosis not present

## 2024-01-09 DIAGNOSIS — Z7901 Long term (current) use of anticoagulants: Secondary | ICD-10-CM | POA: Diagnosis not present

## 2024-01-09 DIAGNOSIS — I5032 Chronic diastolic (congestive) heart failure: Secondary | ICD-10-CM | POA: Diagnosis not present

## 2024-01-09 DIAGNOSIS — I130 Hypertensive heart and chronic kidney disease with heart failure and stage 1 through stage 4 chronic kidney disease, or unspecified chronic kidney disease: Secondary | ICD-10-CM | POA: Insufficient documentation

## 2024-01-09 DIAGNOSIS — I10 Essential (primary) hypertension: Secondary | ICD-10-CM

## 2024-01-09 MED ORDER — POTASSIUM CHLORIDE CRYS ER 20 MEQ PO TBCR
20.0000 meq | EXTENDED_RELEASE_TABLET | Freq: Every day | ORAL | 5 refills | Status: DC
  Filled 2024-01-09: qty 30, 30d supply, fill #0

## 2024-01-09 MED ORDER — FUROSEMIDE 40 MG PO TABS
40.0000 mg | ORAL_TABLET | Freq: Every day | ORAL | 5 refills | Status: DC
Start: 2024-01-09 — End: 2024-01-23
  Filled 2024-01-09: qty 30, 30d supply, fill #0

## 2024-01-09 MED ORDER — AMIODARONE HCL 200 MG PO TABS
400.0000 mg | ORAL_TABLET | Freq: Two times a day (BID) | ORAL | 5 refills | Status: DC
  Filled 2024-01-09 (×2): qty 120, 30d supply, fill #0

## 2024-01-09 NOTE — Progress Notes (Signed)
 ReDS Vest / Clip - 01/09/24 1500       ReDS Vest / Clip   Station Marker D    Ruler Value 38    ReDS Value Range Low volume    ReDS Actual Value 35

## 2024-01-09 NOTE — Progress Notes (Addendum)
 Advanced Heart Failure Clinic Note   Referring Physician: PCP: Myrla Jon [name], MD PCP-Cardiologist: Lonni Hanson, MD   Chief Complaint: Shortness of Breath   HPI:  Mr. Melone is an 83 y/o male with a history of atrial fibrillation, PSVT, NSVT, dialated cardiomyoptathy, HTN, hyperlipidemia, aortic atherosclerosis, RBBB, substance abuse, prostate cancer, sleep apnea, CKD, and chronic HF reduced ejection fraction.   Admitted to [facility] 9/3-9/7 due to chest pain and tachycardia, found to have atrial fibrillation with RVR, rates in the 160s. Cardizem  given and metoprolol  given.   Echo (12/22/23) EF: 55-60%  Admitted to [facility] 9/15 due to chest pain with persistent tachycardia. EKG showed atrial fibrillation. He received IV lasix  and metrprolol. He was Cardioverter on 01/04/24 with symptom resolve.   TEE (01/03/24) EF: 30-35%, LV   He presents today for a initial HF follow up visit accompanied by his sister with a chief complaint of a shortness of breath. He states he can not go more than 20 steps or shower without being short of breath. Endorses shortness of breath, fatigue, and swelling. Further endorses orthopnea but no PND. He sleeps in a recliner and has been unable to wear CPAP post discharge due to uncomfortably of mask. He did have an episode of severe sweating in the middle of the night. Denies chest pain, palpitations, dizziness, and abdominal distention.   He was discharged from the hospital on Sunday. Per sister, he has gradually gotten worse since discharge. The patient states he just feels lousy. Per sister, Friday night ~ 4 pm experienced slurred speech, loss of balance, and drowsiness. Episode lasted about 30 min. Patient does not recall event. No specific cause of onset. Per sister, mimics TIA's their mother use to have.   Prior to hospital admission used alcohol daily, 3-4 times a day. He also uses dip occasionally, has used since discharge. Primarily receives meals on  wheels but sister has assisted with NAS meals and using Mr.Dash for the last 5 days and has reduced fluid intake. Weight has been monitored from 9/19 - today. Weight has ranged between 212lbs to 215lbs.   PT and Home Health have both came to assess care needs, family awaiting services. Sister has come from [place] to assist with care until then.   Past Medical History:  Diagnosis Date   Alcoholism [facility])    Allergy    Aortic atherosclerosis    Arthritis    Ascending aorta dilation 03/28/2018   a.) Vascular US : prox asc Ao measured 29 mm. b.)  CT CAP 06/22/2021: Ao root 41 mm. c.) TTE 06/26/2021: Ao root 41 mm; asc Ao 39 mm   Atrial fibrillation (HCC) 1995   a.) single episode in 1995 per patient; no long term treatment. b.) recurrent episode in the setting of GI bleeding related to colitis 06/2021.   Basal cell carcinoma 04/26/2017   Right medial cheek. Superficial and nodular   Basal cell carcinoma 06/11/2019   Left anterior shoulder. Nodular pattern   Basal cell carcinoma 09/09/2019   Right nasal ala, [facility]   Basal cell carcinoma 02/05/2020   L upper eyebrow, [facility]    Basal cell carcinoma 10/12/2022   left post auricular, ED[facility]   Basal cell carcinoma 05/02/2023   Right medial cheek, [facility]   Bilateral carotid artery stenosis 06/09/2019   a.) Carotid doppler: mod; <50% BILATERAL ICAs.   Cataract    Chicken pox    Colon cancer (HCC)    Colon polyp    Depression    Son  died 2016   Diverticulitis    Diverticulosis 30 years   Gastritis    GERD (gastroesophageal reflux disease)    H. pylori infection    History of stress test    a. 09/2015 MV: No ischemia/infarct. EF 45-54% (nl by echo).   Hyperlipidemia    Hypertension    Irritable bowel syndrome    Lacunar infarction (HCC) 06/08/2019   a.) small; RIGHT motor strip   Neuromuscular disorder (HCC)    NSVT (nonsustained ventricular tachycardia) (HCC)    a.)  Single episode lasting 5 beats at a maximum rate of 160 bpm noted on Holter  study performed 08/13/2021.   Prostate cancer (HCC) 2012   a.) s/p XRT   PSVT (paroxysmal supraventricular tachycardia)    a. 06/2019 Zio: Avg rate 74 (54-120), occas PACs, rare PVCs, 125 episodes of PVCs (longest 17.5 secs; max rate 187). No afib.   RBBB    SAH (subarachnoid hemorrhage) (HCC) 10/10/2018   Sleep apnea    Squamous cell carcinoma of skin 07/18/2017   Left medial calf. KA type   Squamous cell carcinoma of skin 04/24/2018   Right above med. brow   Squamous cell carcinoma of skin 06/11/2019   Right posterior shoulder. SCCis, hypertrophic   Squamous cell carcinoma of skin 01/09/2020   Mid nasal dorsum, MOHS, Efudex x 4wks   Substance abuse (HCC)    Systolic dysfunction    a.) TTE 10/05/2015: EF 50-55%; mild LVH; LAE; triv AR, mild MR. b.) TTE 03/29/2018: EF 55-60%; LAE, mild AR; ? small PFO. c.) TTE 06/09/2019: EF 55-60%, no rwma, triv MR/AI. d.) TTE 06/26/2021: EF 45-50%; glob HK; LAE; mild MR; Ao root 41 mm; asc Ao 39 mm.   Vasovagal syncope     Current Outpatient Medications  Medication Sig Dispense Refill   albuterol  (VENTOLIN  HFA) 108 (90 Base) MCG/ACT inhaler Inhale 2 puffs into the lungs every 6 (six) hours as needed for wheezing or shortness of breath. 8 g 0   Alpha-Lipoic Acid 600 MG TABS Take 1 tablet by mouth daily.     amiodarone  (PACERONE ) 200 MG tablet Take 2 tablets (400 mg total) by mouth 2 (two) times daily for 7 days, THEN 1 tablet (200 mg total) 2 (two) times daily. 88 tablet 0   apixaban  (ELIQUIS ) 5 MG TABS tablet Take 1 tablet (5 mg total) by mouth 2 (two) times daily. 60 tablet 1   atorvastatin  (LIPITOR) 40 MG tablet TAKE 1 TABLET BY MOUTH EVERY DAY 90 tablet 3   cetirizine  (ZYRTEC ) 10 MG tablet Take 10 mg by mouth daily.     Cholecalciferol (VITAMIN D-3) 25 MCG (1000 UT) CAPS Take 1,000 Units by mouth daily.     docusate sodium  (COLACE) 100 MG capsule Take 1 capsule (100 mg total) by mouth 2 (two) times daily. (Patient taking differently: Take 200 mg by  mouth daily.) 30 capsule 1   fluticasone  (FLONASE ) 50 MCG/ACT nasal spray PLACE 1 SPRAY INTO BOTH NOSTRILS DAILY AS NEEDED FOR ALLERGIES OR RHINITIS. 48 mL 1   folic acid  (FOLVITE ) 1 MG tablet Take 1 tablet (1 mg total) by mouth daily. 90 tablet 0   hydrocortisone  2.5 % cream APPLY TO AFFECTED AREA TWICE A DAY AS NEEDED FOR RASH 28 g 12   ketoconazole  (NIZORAL ) 2 % cream Apply once or twice daily to affected skin folds as needed for rash 60 g 5   losartan  (COZAAR ) 25 MG tablet Take 1 tablet (25 mg total) by mouth daily.  30 tablet 0   metoprolol  succinate (TOPROL -XL) 100 MG 24 hr tablet Take 1 tablet (100 mg total) by mouth daily. Take with or immediately following a meal. 30 tablet 0   Multiple Vitamin (MULTIVITAMIN WITH MINERALS) TABS tablet Take 1 tablet by mouth daily. 30 tablet 0   omeprazole  (PRILOSEC) 20 MG capsule TAKE 1 CAPSULE (20 MG TOTAL) BY MOUTH DAILY. REPORTS CURRENTLY TAKING AS NEEDED 90 capsule 1   pregabalin  (LYRICA ) 25 MG capsule Take 25 mg by mouth 2 (two) times daily.     sertraline  (ZOLOFT ) 100 MG tablet Take 1 tablet (100 mg total) by mouth daily. 90 tablet 1   tadalafil  (CIALIS ) 5 MG tablet Take 1 tablet (5 mg total) by mouth daily as needed for erectile dysfunction. 30 tablet 2   tadalafil  (CIALIS ) 5 MG tablet Take 1 tablet (5 mg total) by mouth every other day as needed. 30 tablet 5   thiamine  (VITAMIN B1) 100 MG tablet Take 100 mg by mouth daily.     vitamin B-12 (CYANOCOBALAMIN ) 1000 MCG tablet Take 1,000 mcg by mouth daily.     zinc gluconate 50 MG tablet Take 50 mg by mouth daily.     No current facility-administered medications for this visit.    Allergies  Allergen Reactions   Amoxicillin -Pot Clavulanate Other (See Comments)    Abdominal upset  Has patient had a PCN reaction causing immediate rash, facial/tongue/throat swelling, SOB or lightheadedness with hypotension: Unknown  Has patient had a PCN reaction causing severe rash involving mucus membranes or  skin necrosis: Unknown  Has patient had a PCN reaction that required hospitalization: Unknown  Has patient had a PCN reaction occurring within the last 10 years: Unknown  If all of the above answers are NO, then may proceed with Cephalosporin use.  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  Wine, Beer or Liquor.   Drug use: No   Sexual activity: Not Currently    Birth control/protection: None    Comment: Married  Other Topics Concern   Not on file  Social History Narrative   1 son deceased, 1 daughter living   Social Drivers of Corporate investment banker Strain: Low Risk  (01/02/2024)   Overall Financial Resource Strain (CARDIA)    Difficulty of Paying Living Expenses: Not very hard  Food Insecurity: No Food Insecurity (01/05/2024)   Hunger Vital Sign    Worried About Running Out of Food in the Last Year: Never true    Ran Out of Food in the Last Year: Never true  Transportation Needs: No Transportation Needs (01/05/2024)   PRAPARE - Administrator, Civil Service (Medical): No    Lack of Transportation (Non-Medical): No  Physical Activity: Inactive (11/24/2023)   Exercise Vital Sign    Days of Exercise per  Week: 0 days    Minutes of Exercise per Session: Not on file  Stress: Stress Concern Present (11/24/2023)   [employer] of Occupational Health - Occupational Stress Questionnaire    Feeling of Stress: To some extent  Social Connections: Moderately Integrated (01/01/2024)   Social Connection and Isolation Panel    Frequency of Communication with Friends and Family: Three times a week    Frequency of Social Gatherings with Friends and Family: Three times a week    Attends Religious Services: More than 4 times per year    Active Member of Clubs or Organizations: Yes    Attends Banker Meetings: More than 4 times per year    Marital Status: Widowed  Intimate Partner Violence: Patient Unable To Answer (01/05/2024)   Humiliation, Afraid, Rape, and Kick questionnaire    Fear of Current or Ex-Partner: Patient unable to answer    Emotionally Abused: Patient unable to answer    Physically Abused: Patient unable to answer    Sexually Abused: Patient unable to answer      Family History  Problem Relation Age of Onset   Lung cancer Father        smoker   Cancer Father    Other Mother    Vision loss Mother    Sudden death Son        due to Blood clots   Bipolar disorder Son    Heart disease Son    Early death Son    Learning disabilities Son    Kidney disease Daughter        congenital one small kidney   Varicose Veins Sister    Prostate cancer Neg Hx    Bladder Cancer Neg Hx      Vitals:   01/09/24 1533  BP: 112/83  Pulse: 94  SpO2: 96%  Weight: 100.2 kg   Wt Readings from Last 3 Encounters:  01/09/24 100.2 kg  01/05/24 96.2 kg  01/04/24 95.2 kg    ReDs reading: 35  %, normal   PHYSICAL EXAM: General:  Well appearing. No acute signs of distress.  HEENT: Normal.  Neck: Supple. Mild JVD. No lymphadenopathy. Cor: PMI nondisplaced. Regular rate & rhythm. No rubs, gallops or murmurs. Lungs: Rales lungs bilaterally. Symmetrical lung expansion.  Abdomen:  Soft, nontender, nondistended. No hepatosplenomegaly. No bruits or masses. Good bowel sounds. Extremities: Pitting edema in LE, R +3 >  L+2. No rashes or clubbing.  Neuro: AO x 3. Affect pleasant.  ECG reviewed: Afib    ASSESSMENT & PLAN: 1. Heart failure with reduced ejection fraction (HCC) (Primary) - NYHA Class III  - Hypervolemic  - ReDs reading today 35%, no pulmonary congestion but lower extremity edema noted.  - TEE 01/03/24 EF 50-60%  - Echo 12/22/23 EFL 30-35% - Weight up by 4lbs since hospital admission 4 days ago.  - Continue to weight daily, maintain intake 2000mg  daily and fluid intake 60-64oz - start Furoscix  1 dose  - start lasix  40mg  daily - start potassium 20mg  daily  - continue losartan  25mg  daily  - continue metoprolol  succinate 100mg  daily  - BMET from 01/04/24 reviewed Na+: 136, K+: 4.2, Cr: 1.53, and GFR: 45   2. Persistent atrial fibrillation (HCC) -  EKG today showed afib  - DCCV scheduled for next week - continue eliquis  5mg  BID - continue amiodarone  400mg  BID, do not taper    3. Substance abuse (HCC) - Alcohol and tobacco use cessation encouraged   4. Hyperlipidemia LDL goal <70 - LDL 12/21/23 was 68  - continue atorvastatin  40mg  daily   5. Hypertension - Maintain BP <140/90 goal  - continue metoprolol  succinate 100mg  daily  - saw PCP (Bacigalupo) 12/26/23   6. CKD stage 3a, GFR 45-59 ml/min (HCC) -   BMET from 01/04/24 reviewed Na+: 136, K+: 4.2, Cr: 1.53, and GFR: 45  7. OSA (obstructive sleep apnea) - Advised to wear CPAP at night     Follow up in 2 weeks, sooner if needed  Ellouise Class, FNP/ Solomon Blumenthal, FNP-S   Patient seen and examined with the above-signed Advanced Practice Provider and/or Housestaff. I personally reviewed laboratory data, imaging studies and relevant notes. I independently examined the patient and formulated the important aspects of the plan. I have edited the note to reflect any of my changes or salient points. I  have personally discussed the plan with the patient and/or family.  83 y/o male with OSA, HTN, ETOH/tobacco abuse, CKD 3a and PAF recently discharged from the hospital after admission for acute HF in the setting of AF.   Initial TTE with relatively preserved EF though hard to assess accurately with AF. F/u TEE EF 30-35%. Treated with amio and diuresis and underwent successful DC-CV  Presents today for TOC eval. Here with his wife.   Post-discharge he has recurrent SOB and LE edema. NYHA III-IIIb symptoms. Unable to wear CPAP because he is SOB   On ECG he is back in AF.   General:  Elderly obese male in WC + SOB HEENT: normal Neck: supple.JVP to jaw Carotids 2+ bilat; no bruits. No lymphadenopathy or thryomegaly appreciated. Cor:. Irregular rate & rhythm. No rubs, gallops or murmurs. Lungs: clear Abdomen: soft, nontender, nondistended. No hepatosplenomegaly. No bruits or masses. Good bowel sounds. Extremities: no cyanosis, clubbing, rash, 2+ edema Neuro: alert & orientedx3, cranial nerves grossly intact. moves all 4 extremities w/o difficulty. Affect pleasant  He has progressive HF in setting of ERAF. Will give one dose Furoscix  today and start lasix  40 daily. Continue to load amio 400 bid and plan repeat DC-CV next week. Counseled not to stop Eliquis . Will refer to EP to see if he is a potential ablation candidate down the road.   Will need titration of GDMT post- DC-CV.   I spent a total of 45 minutes today: 1) reviewing the patient's medical records including previous charts, labs and recent notes from other providers; 2) examining the patient and counseling them on their medical issues/explaining the plan of care; 3) adjusting meds as needed and 4) ordering lab work or other needed tests.   Toribio Fuel, MD  10:11 PM

## 2024-01-09 NOTE — Addendum Note (Signed)
 Addended by: CHERRIE TORIBIO SAUNDERS on: 01/09/2024 10:11 PM   Modules accepted: Level of Service

## 2024-01-09 NOTE — H&P (View-Only) (Signed)
 Advanced Heart Failure Clinic Note   Referring Physician: PCP: Myrla Jon [name], MD PCP-Cardiologist: Lonni Hanson, MD   Chief Complaint: Shortness of Breath   HPI:  Cameron Foster is an 83 y/o male with a history of atrial fibrillation, PSVT, NSVT, dialated cardiomyoptathy, HTN, hyperlipidemia, aortic atherosclerosis, RBBB, substance abuse, prostate cancer, sleep apnea, CKD, and chronic HF reduced ejection fraction.   Admitted to [facility] 9/3-9/7 due to chest pain and tachycardia, found to have atrial fibrillation with RVR, rates in the 160s. Cardizem  given and metoprolol  given.   Echo (12/22/23) EF: 55-60%  Admitted to [facility] 9/15 due to chest pain with persistent tachycardia. EKG showed atrial fibrillation. He received IV lasix  and metrprolol. He was Cardioverter on 01/04/24 with symptom resolve.   TEE (01/03/24) EF: 30-35%, LV   He presents today for a initial HF follow up visit accompanied by his sister with a chief complaint of a shortness of breath. He states he can not go more than 20 steps or shower without being short of breath. Endorses shortness of breath, fatigue, and swelling. Further endorses orthopnea but no PND. He sleeps in a recliner and has been unable to wear CPAP post discharge due to uncomfortably of mask. He did have an episode of severe sweating in the middle of the night. Denies chest pain, palpitations, dizziness, and abdominal distention.   He was discharged from the hospital on Sunday. Per sister, he has gradually gotten worse since discharge. The patient states he just feels lousy. Per sister, Friday night ~ 4 pm experienced slurred speech, loss of balance, and drowsiness. Episode lasted about 30 min. Patient does not recall event. No specific cause of onset. Per sister, mimics TIA's their mother use to have.   Prior to hospital admission used alcohol daily, 3-4 times a day. He also uses dip occasionally, has used since discharge. Primarily receives meals on  wheels but sister has assisted with NAS meals and using [employer] for the last 5 days and has reduced fluid intake. Weight has been monitored from 9/19 - today. Weight has ranged between 212lbs to 215lbs.   PT and Home Health have both came to assess care needs, family awaiting services. Sister has come from [place] to assist with care until then.   Past Medical History:  Diagnosis Date   Alcoholism [facility])    Allergy    Aortic atherosclerosis    Arthritis    Ascending aorta dilation 03/28/2018   a.) Vascular US : prox asc Ao measured 29 mm. b.)  CT CAP 06/22/2021: Ao root 41 mm. c.) TTE 06/26/2021: Ao root 41 mm; asc Ao 39 mm   Atrial fibrillation (HCC) 1995   a.) single episode in 1995 per patient; no long term treatment. b.) recurrent episode in the setting of GI bleeding related to colitis 06/2021.   Basal cell carcinoma 04/26/2017   Right medial cheek. Superficial and nodular   Basal cell carcinoma 06/11/2019   Left anterior shoulder. Nodular pattern   Basal cell carcinoma 09/09/2019   Right nasal ala, EDC   Basal cell carcinoma 02/05/2020   L upper eyebrow, EDC    Basal cell carcinoma 10/12/2022   left post auricular, EDC   Basal cell carcinoma 05/02/2023   Right medial cheek, EDC   Bilateral carotid artery stenosis 06/09/2019   a.) Carotid doppler: mod; <50% BILATERAL ICAs.   Cataract    Chicken pox    Colon cancer (HCC)    Colon polyp    Depression    Son  died 2016   Diverticulitis    Diverticulosis 30 years   Gastritis    GERD (gastroesophageal reflux disease)    H. pylori infection    History of stress test    a. 09/2015 MV: No ischemia/infarct. EF 45-54% (nl by echo).   Hyperlipidemia    Hypertension    Irritable bowel syndrome    Lacunar infarction (HCC) 06/08/2019   a.) small; RIGHT motor strip   Neuromuscular disorder (HCC)    NSVT (nonsustained ventricular tachycardia) (HCC)    a.)  Single episode lasting 5 beats at a maximum rate of 160 bpm noted on Holter  study performed 08/13/2021.   Prostate cancer (HCC) 2012   a.) s/p XRT   PSVT (paroxysmal supraventricular tachycardia)    a. 06/2019 Zio: Avg rate 74 (54-120), occas PACs, rare PVCs, 125 episodes of PVCs (longest 17.5 secs; max rate 187). No afib.   RBBB    SAH (subarachnoid hemorrhage) (HCC) 10/10/2018   Sleep apnea    Squamous cell carcinoma of skin 07/18/2017   Left medial calf. KA type   Squamous cell carcinoma of skin 04/24/2018   Right above med. brow   Squamous cell carcinoma of skin 06/11/2019   Right posterior shoulder. SCCis, hypertrophic   Squamous cell carcinoma of skin 01/09/2020   Mid nasal dorsum, MOHS, Efudex x 4wks   Substance abuse (HCC)    Systolic dysfunction    a.) TTE 10/05/2015: EF 50-55%; mild LVH; LAE; triv AR, mild MR. b.) TTE 03/29/2018: EF 55-60%; LAE, mild AR; ? small PFO. c.) TTE 06/09/2019: EF 55-60%, no rwma, triv MR/AI. d.) TTE 06/26/2021: EF 45-50%; glob HK; LAE; mild MR; Ao root 41 mm; asc Ao 39 mm.   Vasovagal syncope     Current Outpatient Medications  Medication Sig Dispense Refill   albuterol  (VENTOLIN  HFA) 108 (90 Base) MCG/ACT inhaler Inhale 2 puffs into the lungs every 6 (six) hours as needed for wheezing or shortness of breath. 8 g 0   Alpha-Lipoic Acid 600 MG TABS Take 1 tablet by mouth daily.     amiodarone  (PACERONE ) 200 MG tablet Take 2 tablets (400 mg total) by mouth 2 (two) times daily for 7 days, THEN 1 tablet (200 mg total) 2 (two) times daily. 88 tablet 0   apixaban  (ELIQUIS ) 5 MG TABS tablet Take 1 tablet (5 mg total) by mouth 2 (two) times daily. 60 tablet 1   atorvastatin  (LIPITOR) 40 MG tablet TAKE 1 TABLET BY MOUTH EVERY DAY 90 tablet 3   cetirizine  (ZYRTEC ) 10 MG tablet Take 10 mg by mouth daily.     Cholecalciferol (VITAMIN D-3) 25 MCG (1000 UT) CAPS Take 1,000 Units by mouth daily.     docusate sodium  (COLACE) 100 MG capsule Take 1 capsule (100 mg total) by mouth 2 (two) times daily. (Patient taking differently: Take 200 mg by  mouth daily.) 30 capsule 1   fluticasone  (FLONASE ) 50 MCG/ACT nasal spray PLACE 1 SPRAY INTO BOTH NOSTRILS DAILY AS NEEDED FOR ALLERGIES OR RHINITIS. 48 mL 1   folic acid  (FOLVITE ) 1 MG tablet Take 1 tablet (1 mg total) by mouth daily. 90 tablet 0   hydrocortisone  2.5 % cream APPLY TO AFFECTED AREA TWICE A DAY AS NEEDED FOR RASH 28 g 12   ketoconazole  (NIZORAL ) 2 % cream Apply once or twice daily to affected skin folds as needed for rash 60 g 5   losartan  (COZAAR ) 25 MG tablet Take 1 tablet (25 mg total) by mouth daily.  30 tablet 0   metoprolol  succinate (TOPROL -XL) 100 MG 24 hr tablet Take 1 tablet (100 mg total) by mouth daily. Take with or immediately following a meal. 30 tablet 0   Multiple Vitamin (MULTIVITAMIN WITH MINERALS) TABS tablet Take 1 tablet by mouth daily. 30 tablet 0   omeprazole  (PRILOSEC) 20 MG capsule TAKE 1 CAPSULE (20 MG TOTAL) BY MOUTH DAILY. REPORTS CURRENTLY TAKING AS NEEDED 90 capsule 1   pregabalin  (LYRICA ) 25 MG capsule Take 25 mg by mouth 2 (two) times daily.     sertraline  (ZOLOFT ) 100 MG tablet Take 1 tablet (100 mg total) by mouth daily. 90 tablet 1   tadalafil  (CIALIS ) 5 MG tablet Take 1 tablet (5 mg total) by mouth daily as needed for erectile dysfunction. 30 tablet 2   tadalafil  (CIALIS ) 5 MG tablet Take 1 tablet (5 mg total) by mouth every other day as needed. 30 tablet 5   thiamine  (VITAMIN B1) 100 MG tablet Take 100 mg by mouth daily.     vitamin B-12 (CYANOCOBALAMIN ) 1000 MCG tablet Take 1,000 mcg by mouth daily.     zinc gluconate 50 MG tablet Take 50 mg by mouth daily.     No current facility-administered medications for this visit.    Allergies  Allergen Reactions   Amoxicillin -Pot Clavulanate Other (See Comments)    Abdominal upset  Has patient had a PCN reaction causing immediate rash, facial/tongue/throat swelling, SOB or lightheadedness with hypotension: Unknown  Has patient had a PCN reaction causing severe rash involving mucus membranes or  skin necrosis: Unknown  Has patient had a PCN reaction that required hospitalization: Unknown  Has patient had a PCN reaction occurring within the last 10 years: Unknown  If all of the above answers are NO, then may proceed with Cephalosporin use.  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  Wine, Beer or Liquor.   Drug use: No   Sexual activity: Not Currently    Birth control/protection: None    Comment: Married  Other Topics Concern   Not on file  Social History Narrative   1 son deceased, 1 daughter living   Social Drivers of Corporate investment banker Strain: Low Risk  (01/02/2024)   Overall Financial Resource Strain (CARDIA)    Difficulty of Paying Living Expenses: Not very hard  Food Insecurity: No Food Insecurity (01/05/2024)   Hunger Vital Sign    Worried About Running Out of Food in the Last Year: Never true    Ran Out of Food in the Last Year: Never true  Transportation Needs: No Transportation Needs (01/05/2024)   PRAPARE - Administrator, Civil Service (Medical): No    Lack of Transportation (Non-Medical): No  Physical Activity: Inactive (11/24/2023)   Exercise Vital Sign    Days of Exercise per  Week: 0 days    Minutes of Exercise per Session: Not on file  Stress: Stress Concern Present (11/24/2023)   [employer] of Occupational Health - Occupational Stress Questionnaire    Feeling of Stress: To some extent  Social Connections: Moderately Integrated (01/01/2024)   Social Connection and Isolation Panel    Frequency of Communication with Friends and Family: Three times a week    Frequency of Social Gatherings with Friends and Family: Three times a week    Attends Religious Services: More than 4 times per year    Active Member of Clubs or Organizations: Yes    Attends Banker Meetings: More than 4 times per year    Marital Status: Widowed  Intimate Partner Violence: Patient Unable To Answer (01/05/2024)   Humiliation, Afraid, Rape, and Kick questionnaire    Fear of Current or Ex-Partner: Patient unable to answer    Emotionally Abused: Patient unable to answer    Physically Abused: Patient unable to answer    Sexually Abused: Patient unable to answer      Family History  Problem Relation Age of Onset   Lung cancer Father        smoker   Cancer Father    Other Mother    Vision loss Mother    Sudden death Son        due to Blood clots   Bipolar disorder Son    Heart disease Son    Early death Son    Learning disabilities Son    Kidney disease Daughter        congenital one small kidney   Varicose Veins Sister    Prostate cancer Neg Hx    Bladder Cancer Neg Hx      Vitals:   01/09/24 1533  BP: 112/83  Pulse: 94  SpO2: 96%  Weight: 100.2 kg   Wt Readings from Last 3 Encounters:  01/09/24 100.2 kg  01/05/24 96.2 kg  01/04/24 95.2 kg    ReDs reading: 35  %, normal   PHYSICAL EXAM: General:  Well appearing. No acute signs of distress.  HEENT: Normal.  Neck: Supple. Mild JVD. No lymphadenopathy. Cor: PMI nondisplaced. Regular rate & rhythm. No rubs, gallops or murmurs. Lungs: Rales lungs bilaterally. Symmetrical lung expansion.  Abdomen:  Soft, nontender, nondistended. No hepatosplenomegaly. No bruits or masses. Good bowel sounds. Extremities: Pitting edema in LE, R +3 >  L+2. No rashes or clubbing.  Neuro: AO x 3. Affect pleasant.  ECG reviewed: Afib    ASSESSMENT & PLAN: 1. Heart failure with reduced ejection fraction (HCC) (Primary) - NYHA Class III  - Hypervolemic  - ReDs reading today 35%, no pulmonary congestion but lower extremity edema noted.  - TEE 01/03/24 EF 50-60%  - Echo 12/22/23 EFL 30-35% - Weight up by 4lbs since hospital admission 4 days ago.  - Continue to weight daily, maintain intake 2000mg  daily and fluid intake 60-64oz - start Furoscix  1 dose  - start lasix  40mg  daily - start potassium 20mg  daily  - continue losartan  25mg  daily  - continue metoprolol  succinate 100mg  daily  - BMET from 01/04/24 reviewed Na+: 136, K+: 4.2, Cr: 1.53, and GFR: 45   2. Persistent atrial fibrillation (HCC) -  EKG today showed afib  - DCCV scheduled for next week - continue eliquis  5mg  BID - continue amiodarone  400mg  BID, do not taper    3. Substance abuse (HCC) - Alcohol and tobacco use cessation encouraged   4. Hyperlipidemia LDL goal <70 - LDL 12/21/23 was 68  - continue atorvastatin  40mg  daily   5. Hypertension - Maintain BP <140/90 goal  - continue metoprolol  succinate 100mg  daily  - saw PCP (Bacigalupo) 12/26/23   6. CKD stage 3a, GFR 45-59 ml/min (HCC) -   BMET from 01/04/24 reviewed Na+: 136, K+: 4.2, Cr: 1.53, and GFR: 45  7. OSA (obstructive sleep apnea) - Advised to wear CPAP at night     Follow up in 2 weeks, sooner if needed  Ellouise Class, FNP/ Solomon Blumenthal, FNP-S   Patient seen and examined with the above-signed Advanced Practice Provider and/or Housestaff. I personally reviewed laboratory data, imaging studies and relevant notes. I independently examined the patient and formulated the important aspects of the plan. I have edited the note to reflect any of my changes or salient points. I  have personally discussed the plan with the patient and/or family.  83 y/o male with OSA, HTN, ETOH/tobacco abuse, CKD 3a and PAF recently discharged from the hospital after admission for acute HF in the setting of AF.   Initial TTE with relatively preserved EF though hard to assess accurately with AF. F/u TEE EF 30-35%. Treated with amio and diuresis and underwent successful DC-CV  Presents today for TOC eval. Here with his wife.   Post-discharge he has recurrent SOB and LE edema. NYHA III-IIIb symptoms. Unable to wear CPAP because he is SOB   On ECG he is back in AF.   General:  Elderly obese male in WC + SOB HEENT: normal Neck: supple.JVP to jaw Carotids 2+ bilat; no bruits. No lymphadenopathy or thryomegaly appreciated. Cor:. Irregular rate & rhythm. No rubs, gallops or murmurs. Lungs: clear Abdomen: soft, nontender, nondistended. No hepatosplenomegaly. No bruits or masses. Good bowel sounds. Extremities: no cyanosis, clubbing, rash, 2+ edema Neuro: alert & orientedx3, cranial nerves grossly intact. moves all 4 extremities w/o difficulty. Affect pleasant  He has progressive HF in setting of ERAF. Will give one dose Furoscix  today and start lasix  40 daily. Continue to load amio 400 bid and plan repeat DC-CV next week. Counseled not to stop Eliquis . Will refer to EP to see if he is a potential ablation candidate down the road.   Will need titration of GDMT post- DC-CV.   I spent a total of 45 minutes today: 1) reviewing the patient's medical records including previous charts, labs and recent notes from other providers; 2) examining the patient and counseling them on their medical issues/explaining the plan of care; 3) adjusting meds as needed and 4) ordering lab work or other needed tests.   Toribio Fuel, MD  10:11 PM

## 2024-01-09 NOTE — Telephone Encounter (Unsigned)
 Copied from CRM [id number]. Topic: Clinical - Medication Refill >> Jan 09, 2024  8:48 AM Berwyn MATSU wrote: Medication: atorvastatin  (LIPITOR) 40 MG tablet   Has the patient contacted their pharmacy? Yes (Agent: If no, request that the patient contact the pharmacy for the refill. If patient does not wish to contact the pharmacy document the reason why and proceed with request.) (Agent: If yes, when and what did the pharmacy advise?)  This is the patient's preferred pharmacy:  [id number] [address] [facility] - [address] [place] [address] Phone: [phone] Fax: [phone]    Is this the correct pharmacy for this prescription? Yes If no, delete pharmacy and type the correct one.   Has the prescription been filled recently? Yes  Is the patient out of the medication? Yes  Has the patient been seen for an appointment in the last year OR does the patient have an upcoming appointment? Yes  Can we respond through MyChart? Yes  Agent: Please be advised that Rx refills may take up to 3 business days. We ask that you follow-up with your pharmacy.

## 2024-01-09 NOTE — Patient Instructions (Addendum)
 Special Instructions:  Call FUROSCIX  Direct at [phone] for questions regarding on body infuser.   Medication Changes:  USE 1 FUROSCIX   THEN,THE NEXT DAY START TAKING LASIX  40 MG ONCE DAILY AND POTASSIUM 20 MEQ ONCE DAILY  ALSO, CONTINUE AMIODARONE  400 MG TWICE DAILY   Testing/Procedures:  WE WILL BE CALLING YOU SOMETIME THIS WEEK TO GET YOUR CARDIOVERSION SCHEDULED WITH DR. ZENAIDA.   Follow-Up in: 1 WEEK AFTER YOUR CARDIOVERSION.   Thank you for choosing [facility].    At the Advanced Heart Failure Clinic, you and your health needs are our priority. We have a designated team specialized in the treatment of Heart Failure. This Care Team includes your primary Heart Failure Specialized Cardiologist (physician), Advanced Practice Providers (APPs- Physician Assistants and Nurse Practitioners), and Pharmacist who all work together to provide you with the care you need, when you need it.   You may see any of the following providers on your designated Care Team at your next follow up:  Dr. Toribio Fuel Dr. Ezra Shuck Dr. Ria Commander Dr. Morene ZENAIDA Ellouise Class, FNP Jaun Bash, RPH-CPP  Please be sure to bring in all your medications bottles to every appointment.   Need to Contact Us :  If you have any questions or concerns before your next appointment please send us  a message through [place] or call our office at [phone].    TO LEAVE A MESSAGE FOR THE NURSE SELECT OPTION 2, PLEASE LEAVE A MESSAGE INCLUDING: YOUR NAME DATE OF BIRTH CALL BACK NUMBER REASON FOR CALL**this is important as we prioritize the call backs  YOU WILL RECEIVE A CALL BACK THE SAME DAY AS LONG AS YOU CALL BEFORE 4:00 PM [phone]

## 2024-01-10 ENCOUNTER — Other Ambulatory Visit: Payer: Self-pay

## 2024-01-10 DIAGNOSIS — I4819 Other persistent atrial fibrillation: Secondary | ICD-10-CM

## 2024-01-10 MED ORDER — ATORVASTATIN CALCIUM 40 MG PO TABS: 40.0000 mg | ORAL_TABLET | Freq: Every day | ORAL | 0 refills | Status: DC

## 2024-01-10 NOTE — Progress Notes (Signed)
 Orders placed for dccv No precert req

## 2024-01-10 NOTE — Telephone Encounter (Signed)
 Requested Prescriptions  Pending Prescriptions Disp Refills   atorvastatin  (LIPITOR) 40 MG tablet 90 tablet 0    Sig: Take 1 tablet (40 mg total) by mouth daily.     Cardiovascular:  Antilipid - Statins Failed - 01/10/2024  8:23 AM      Failed - Lipid Panel in normal range within the last 12 months    Cholesterol, Total  Date Value Ref Range Status  09/08/2022 145 100 - 199 mg/dL Final   Cholesterol  Date Value Ref Range Status  12/21/2023 154 0 - 200 mg/dL Final   LDL Chol Calc (NIH)  Date Value Ref Range Status  09/08/2022 69 0 - 99 mg/dL Final   LDL Cholesterol  Date Value Ref Range Status  12/21/2023 68 0 - 99 mg/dL Final    Comment:           Total Cholesterol/HDL:CHD Risk Coronary Heart Disease Risk Table                     Men   Women  1/2 Average Risk   3.4   3.3  Average Risk       5.0   4.4  2 X Average Risk   9.6   7.1  3 X Average Risk  23.4   11.0        Use the calculated Patient Ratio above and the CHD Risk Table to determine the patient's CHD Risk.        ATP III CLASSIFICATION (LDL):  <100     mg/dL   Optimal  899-870  mg/dL   Near or Above                    Optimal  130-159  mg/dL   Borderline  839-810  mg/dL   High  >809     mg/dL   Very High Performed at [facility], [address] [place], [place] [address]    HDL  Date Value Ref Range Status  12/21/2023 53 >40 mg/dL Final  94/76/7975 48 >60 mg/dL Final   Triglycerides  Date Value Ref Range Status  12/21/2023 167 (H) <150 mg/dL Final         Passed - Patient is not pregnant      Passed - Valid encounter within last 12 months    Recent Outpatient Visits           2 weeks ago Atrial fibrillation with RVR [facility])   [facility] [place], Jon [name], MD   1 month ago Grief reaction with prolonged bereavement   [facility] [place], Lauraine SAILOR, DO   3 months ago Viral upper respiratory tract infection   [facility]  [facility] Bernardo Fend, [place]

## 2024-01-11 ENCOUNTER — Encounter: Payer: Self-pay | Admitting: Family Medicine

## 2024-01-11 ENCOUNTER — Telehealth: Payer: Self-pay

## 2024-01-11 ENCOUNTER — Ambulatory Visit: Admitting: Family Medicine

## 2024-01-11 VITALS — BP 107/78 | HR 101 | Ht 69.0 in | Wt 210.5 lb

## 2024-01-11 DIAGNOSIS — M5416 Radiculopathy, lumbar region: Secondary | ICD-10-CM | POA: Diagnosis not present

## 2024-01-11 DIAGNOSIS — I5032 Chronic diastolic (congestive) heart failure: Secondary | ICD-10-CM | POA: Diagnosis not present

## 2024-01-11 DIAGNOSIS — I4891 Unspecified atrial fibrillation: Secondary | ICD-10-CM | POA: Diagnosis not present

## 2024-01-11 DIAGNOSIS — Z789 Other specified health status: Secondary | ICD-10-CM | POA: Diagnosis not present

## 2024-01-11 DIAGNOSIS — E876 Hypokalemia: Secondary | ICD-10-CM | POA: Diagnosis not present

## 2024-01-11 DIAGNOSIS — R5381 Other malaise: Secondary | ICD-10-CM | POA: Diagnosis not present

## 2024-01-11 DIAGNOSIS — C44629 Squamous cell carcinoma of skin of left upper limb, including shoulder: Secondary | ICD-10-CM | POA: Diagnosis not present

## 2024-01-11 DIAGNOSIS — I7121 Aneurysm of the ascending aorta, without rupture: Secondary | ICD-10-CM | POA: Diagnosis not present

## 2024-01-11 DIAGNOSIS — J42 Unspecified chronic bronchitis: Secondary | ICD-10-CM | POA: Diagnosis not present

## 2024-01-11 DIAGNOSIS — C61 Malignant neoplasm of prostate: Secondary | ICD-10-CM | POA: Diagnosis not present

## 2024-01-11 DIAGNOSIS — I480 Paroxysmal atrial fibrillation: Secondary | ICD-10-CM | POA: Diagnosis not present

## 2024-01-11 DIAGNOSIS — F1020 Alcohol dependence, uncomplicated: Secondary | ICD-10-CM | POA: Diagnosis not present

## 2024-01-11 DIAGNOSIS — I5042 Chronic combined systolic (congestive) and diastolic (congestive) heart failure: Secondary | ICD-10-CM | POA: Diagnosis not present

## 2024-01-11 DIAGNOSIS — R7303 Prediabetes: Secondary | ICD-10-CM | POA: Diagnosis not present

## 2024-01-11 DIAGNOSIS — E785 Hyperlipidemia, unspecified: Secondary | ICD-10-CM | POA: Diagnosis not present

## 2024-01-11 DIAGNOSIS — N1831 Chronic kidney disease, stage 3a: Secondary | ICD-10-CM | POA: Diagnosis not present

## 2024-01-11 DIAGNOSIS — I130 Hypertensive heart and chronic kidney disease with heart failure and stage 1 through stage 4 chronic kidney disease, or unspecified chronic kidney disease: Secondary | ICD-10-CM | POA: Diagnosis not present

## 2024-01-11 NOTE — Telephone Encounter (Signed)
 OK for verbals

## 2024-01-11 NOTE — Telephone Encounter (Signed)
 Copied from CRM #[id number]. Topic: Clinical - Home Health Verbal Orders >> Jan 11, 2024 10:47 AM Tiffini S wrote: Caller/Agency: Amanda with [facility] Callback Number: [phone] option: 2 Service Requested: Home Health [place] for bathing and activity of daily living assistance ADL  Frequency:  twice a week for 6 weeks  Any new concerns about the patient? No Please call back with ok for verbal orders

## 2024-01-11 NOTE — Progress Notes (Signed)
 Established patient visit   Patient: Cameron Foster.   DOB: March 30, 1941   [name]  MRN: [id number] Visit Date: 01/11/2024  Today's healthcare provider: Jon Eva, MD   Chief Complaint  Patient presents with   Hospitalization Follow-up    Patient was admitted to [facility] 01/01/24-01/04/24. Reports lasix  pump helped a lot. Advised sister after taking all meds that he has not felt good on stomach or in the head (sinus pressure). Sister husband noticed he had some slurred speech on Friday and unbalanced.   Subjective    HPI HPI     Hospitalization Follow-up    Additional comments: [facility] was [facility]-01/04/24. Reports lasix  pump helped a lot. Advised sister after taking all meds that he has not felt good on stomach or in the head (sinus pressure). Sister husband noticed he had some slurred speech on Friday and unbalanced.      Last edited by Lilian Fitzpatrick, CMA on 01/11/2024  2:37 PM.       Discussed the use of AI scribe software for clinical note transcription with the patient, who gave verbal consent to proceed.  History of Present Illness   Cameron Foster. Cameron Foster is an 83 year old male with atrial fibrillation and congestive heart failure who presents for a hospital follow-up after recent hospitalization for AFib with RVR. He is accompanied by his sister, who is assisting with his care.  He was hospitalized from September 3 to September 7 for atrial fibrillation with rapid ventricular rate and experienced an acute kidney injury during this time. Initial treatment with diltiazem  and metoprolol  was adjusted due to rate control issues and hypotension. He was discharged on apixaban  for stroke risk management.  He was readmitted from September 15 to September 18 with chest pain and AFib with RVR. A transesophageal echocardiogram and successful cardioversion were performed on September 17, and he was started on amiodarone  for rhythm control.  Metoprolol  was adjusted, and diltiazem  was discontinued.  Today, he feels worse with nausea and headache after taking his morning medications, despite improvement the previous day with Lasix . He experienced a transient episode of slurred speech and balance issues after his initial cardioversion, suggestive of a TIA.  He lives alone with assistance from his sister and home health services, and he is arranging additional personal care services through Medicare. He is currently taking amiodarone  400 mg twice daily, metoprolol , apixaban , and Lasix  for fluid management, which has reduced leg swelling.        TOC call completed 9/19 - reviewed   Medications: Outpatient Medications Prior to Visit  Medication Sig   albuterol  (VENTOLIN  HFA) 108 (90 Base) MCG/ACT inhaler Inhale 2 puffs into the lungs every 6 (six) hours as needed for wheezing or shortness of breath.   Alpha-Lipoic Acid 600 MG TABS Take 1 tablet by mouth daily.   amiodarone  (PACERONE ) 200 MG tablet Take 2 tablets (400 mg total) by mouth 2 (two) times daily.   apixaban  (ELIQUIS ) 5 MG TABS tablet Take 1 tablet (5 mg total) by mouth 2 (two) times daily.   atorvastatin  (LIPITOR) 40 MG tablet Take 1 tablet (40 mg total) by mouth daily.   cetirizine  (ZYRTEC ) 10 MG tablet Take 10 mg by mouth daily.   Cholecalciferol (VITAMIN D-3) 25 MCG (1000 UT) CAPS Take 1,000 Units by mouth daily.   docusate sodium  (COLACE) 100 MG capsule Take 1 capsule (100 mg total) by mouth 2 (two) times daily. (Patient taking  differently: Take 200 mg by mouth daily.)   fluticasone  (FLONASE ) 50 MCG/ACT nasal spray PLACE 1 SPRAY INTO BOTH NOSTRILS DAILY AS NEEDED FOR ALLERGIES OR RHINITIS.   folic acid  (FOLVITE ) 1 MG tablet Take 1 tablet (1 mg total) by mouth daily.   furosemide  (LASIX ) 40 MG tablet Take 1 tablet (40 mg total) by mouth daily.   hydrocortisone  2.5 % cream APPLY TO AFFECTED AREA TWICE A DAY AS NEEDED FOR RASH   ketoconazole  (NIZORAL ) 2 % cream Apply once  or twice daily to affected skin folds as needed for rash   losartan  (COZAAR ) 25 MG tablet Take 1 tablet (25 mg total) by mouth daily.   metoprolol  succinate (TOPROL -XL) 100 MG 24 hr tablet Take 1 tablet (100 mg total) by mouth daily. Take with or immediately following a meal.   Multiple Vitamin (MULTIVITAMIN WITH MINERALS) TABS tablet Take 1 tablet by mouth daily.   omeprazole  (PRILOSEC) 20 MG capsule TAKE 1 CAPSULE (20 MG TOTAL) BY MOUTH DAILY. REPORTS CURRENTLY TAKING AS NEEDED   potassium chloride  SA (KLOR-CON  M) 20 MEQ tablet Take 1 tablet (20 mEq total) by mouth daily.   pregabalin  (LYRICA ) 25 MG capsule Take 25 mg by mouth 2 (two) times daily.   sertraline  (ZOLOFT ) 100 MG tablet Take 1 tablet (100 mg total) by mouth daily.   tadalafil  (CIALIS ) 5 MG tablet Take 1 tablet (5 mg total) by mouth daily as needed for erectile dysfunction.   tadalafil  (CIALIS ) 5 MG tablet Take 1 tablet (5 mg total) by mouth every other day as needed.   thiamine  (VITAMIN B1) 100 MG tablet Take 100 mg by mouth daily.   vitamin B-12 (CYANOCOBALAMIN ) 1000 MCG tablet Take 1,000 mcg by mouth daily.   zinc gluconate 50 MG tablet Take 50 mg by mouth daily.   No facility-administered medications prior to visit.    Review of Systems     Objective    BP 107/78 (BP Location: Left Arm, Patient Position: Sitting, Cuff Size: Normal)   Pulse (!) 101   Ht 5' 9 (1.753 m)   Wt 210 lb 8 oz (95.5 kg)   SpO2 97%   BMI 31.09 kg/m    Physical Exam Vitals reviewed.  Constitutional:      General: He is not in acute distress.    Appearance: Normal appearance. He is not diaphoretic.  HENT:     Head: Normocephalic and atraumatic.  Eyes:     General: No scleral icterus.    Conjunctiva/sclera: Conjunctivae normal.  Cardiovascular:     Rate and Rhythm: Normal rate. Rhythm irregularly irregular.     Heart sounds: Normal heart sounds. No murmur heard. Pulmonary:     Effort: Pulmonary effort is normal. No respiratory  distress.     Breath sounds: Normal breath sounds. No wheezing or rhonchi.  Musculoskeletal:     Cervical back: Neck supple.     Right lower leg: Edema (trace) present.     Left lower leg: Edema (trace) present.  Lymphadenopathy:     Cervical: No cervical adenopathy.  Skin:    General: Skin is warm and dry.     Findings: No rash.  Neurological:     Mental Status: He is alert and oriented to person, place, and time. Mental status is at baseline.  Psychiatric:        Mood and Affect: Mood normal.        Behavior: Behavior normal.      No results found for any visits  on 01/11/24.  Assessment & Plan     Problem List Items Addressed This Visit       Cardiovascular and Mediastinum   Atrial fibrillation with RVR (HCC) - Primary     Other   Debility   Other Visit Diagnoses       Decreased activities of daily living (ADL)         Heart failure with improved ejection fraction (HFimpEF) (HCC)               Atrial fibrillation with rapid ventricular response Recurrent atrial fibrillation with RVR despite recent cardioversion and initiation of amiodarone . Cardioversion on 9/17 was initially successful but reverted to AFib within two days. Currently on amiodarone  400 mg twice daily and metoprolol  100 mg daily. Blood pressure improved to 107/78 mmHg. No recent TIA documented, but reported episode of slurred speech and balance issues suggestive of TIA. - Continue amiodarone  400 mg twice daily - Continue metoprolol  100 mg daily - Proceed with scheduled cardioversion on October 1st - Evaluate for potential ablation if cardioversion fails  Congestive heart failure with recent volume overload Recent volume overload improved with diuretic therapy, specifically Lasix . Notable improvement in leg swelling and mobility. Currently on Lasix  with good response, minimal swelling observed. - Continue Lasix  as prescribed  Functional impairment due to cardiac disease Functional impairment  necessitating home care. Currently receiving home health services a few times a week. Working on obtaining personal care services through [employer]. Long-term care insurance requires a note specifying the need for home care hours. - Provide a note for long-term care insurance specifying home care needs - Coordinate with social worker and nurse for personal care services through Medicare       Return in about 2 months (around 03/12/2024) for chronic disease f/u - change 10/9 appt.       Jon Eva, MD  [facility] [phone] (phone) [phone] (fax)  [facility]

## 2024-01-12 ENCOUNTER — Encounter: Payer: Self-pay | Admitting: Family Medicine

## 2024-01-12 ENCOUNTER — Other Ambulatory Visit: Payer: Self-pay

## 2024-01-12 ENCOUNTER — Telehealth: Payer: Self-pay

## 2024-01-12 NOTE — Transitions of Care (Post Inpatient/ED Visit) (Signed)
 Transition of Care week 2  Visit Note  01/12/2024  Name: Rome Echavarria. MRN: [id number]          DOB: 10/12/1940  Situation: Patient enrolled in [facility] 30-day program. Visit completed with patient and sister Loraine by telephone.   Background:   Initial Transition Care Management Follow-up Telephone Call    Past Medical History:  Diagnosis Date   Alcoholism [facility])    Allergy    Aortic atherosclerosis    Arthritis    Ascending aorta dilation 03/28/2018   a.) Vascular US : prox asc Ao measured 29 mm. b.)  CT CAP 06/22/2021: Ao root 41 mm. c.) TTE 06/26/2021: Ao root 41 mm; asc Ao 39 mm   Atrial fibrillation (HCC) 1995   a.) single episode in 1995 per patient; no long term treatment. b.) recurrent episode in the setting of GI bleeding related to colitis 06/2021.   Basal cell carcinoma 04/26/2017   Right medial cheek. Superficial and nodular   Basal cell carcinoma 06/11/2019   Left anterior shoulder. Nodular pattern   Basal cell carcinoma 09/09/2019   Right nasal ala, EDC   Basal cell carcinoma 02/05/2020   L upper eyebrow, EDC    Basal cell carcinoma 10/12/2022   left post auricular, EDC   Basal cell carcinoma 05/02/2023   Right medial cheek, EDC   Bilateral carotid artery stenosis 06/09/2019   a.) Carotid doppler: mod; <50% BILATERAL ICAs.   Cataract    Chicken pox    Colon cancer (HCC)    Colon polyp    Depression    Son died 01/24/15   Diverticulitis    Diverticulosis 30 years   Gastritis    GERD (gastroesophageal reflux disease)    H. pylori infection    History of stress test    a. 09/2015 MV: No ischemia/infarct. EF 45-54% (nl by echo).   Hyperlipidemia    Hypertension    Irritable bowel syndrome    Lacunar infarction (HCC) 06/08/2019   a.) small; RIGHT motor strip   Myocardial infarction [facility])    Neuromuscular disorder (HCC)    NSVT (nonsustained ventricular tachycardia) (HCC)    a.)  Single episode lasting 5 beats at a maximum rate of 160 bpm noted on Holter  study performed 08/13/2021.   Prostate cancer (HCC) 24-Jan-2011   a.) s/p XRT   PSVT (paroxysmal supraventricular tachycardia)    a. 06/2019 [employer]: Avg rate 74 (54-120), occas PACs, rare PVCs, 125 episodes of PVCs (longest 17.5 secs; max rate 187). No afib.   RBBB    SAH (subarachnoid hemorrhage) (HCC) 10/10/2018   Sleep apnea    Squamous cell carcinoma of skin 07/18/2017   Left medial calf. KA type   Squamous cell carcinoma of skin 04/24/2018   Right above med. brow   Squamous cell carcinoma of skin 06/11/2019   Right posterior shoulder. SCCis, hypertrophic   Squamous cell carcinoma of skin 01/09/2020   Mid nasal dorsum, MOHS, Efudex x 4wks   Stroke [facility])    Substance abuse (HCC)    Systolic dysfunction    a.) TTE 10/05/2015: EF 50-55%; mild LVH; LAE; triv AR, mild MR. b.) TTE 03/29/2018: EF 55-60%; LAE, mild AR; ? small PFO. c.) TTE 06/09/2019: EF 55-60%, no rwma, triv MR/AI. d.) TTE 06/26/2021: EF 45-50%; glob HK; LAE; mild MR; Ao root 41 mm; asc Ao 39 mm.   Vasovagal syncope     Assessment: Patient Reported Symptoms: Cognitive Cognitive Status: No symptoms reported, Alert and oriented to  person, place, and time, Normal speech and language skills      Neurological Neurological Review of Symptoms: Not assessed    HEENT HEENT Symptoms Reported: No symptoms reported      Cardiovascular Cardiovascular Symptoms Reported: Fatigue Cardiovascular Management Strategies: Activity, Adequate rest, Diet modification, Fluid modification, Medication therapy, Routine screening, Weight management Weight: 210 lb 8 oz (95.5 kg) Cardiovascular Self-Management Outcome: 2 (bad) Cardiovascular Comment: Coughing  Respiratory Respiratory Symptoms Reported: Productive cough, Shortness of breath Other Respiratory Symptoms: HF Respiratory Management Strategies: Adequate rest, CPAP Respiratory Self-Management Outcome: 2 (bad)  Endocrine Endocrine Symptoms Reported: No symptoms reported    Gastrointestinal  Gastrointestinal Symptoms Reported: No symptoms reported      Genitourinary Genitourinary Symptoms Reported: No symptoms reported Genitourinary Management Strategies: Activity, Adequate rest, Fluid modification (64 ounces) Genitourinary Self-Management Outcome: 4 (good)  Integumentary Integumentary Symptoms Reported: No symptoms reported    Musculoskeletal Musculoskelatal Symptoms Reviewed: Weakness, Unsteady gait Musculoskeletal Management Strategies: Activity, Adequate rest Musculoskeletal Self-Management Outcome: 2 (bad)      Psychosocial Psychosocial Symptoms Reported: No symptoms reported         There were no vitals filed for this visit.  Medications Reviewed Today     Reviewed by Eilleen Richerd GRADE, RN (Registered Nurse) on 01/12/24 at 1559  Med List Status: <None>   Medication Order Taking? Sig Documenting Provider Last Dose Status Informant  albuterol  (VENTOLIN  HFA) 108 (90 Base) MCG/ACT inhaler [id number]  Inhale 2 puffs into the lungs every 6 (six) hours as needed for wheezing or shortness of breath. Levander Slate, MD  Active Self, Other           Med Note LESLY, RICHERD GRADE Debar Dec 26, 2023  2:22 PM) Patient has on hand but not taking until needed  Alpha-Lipoic Acid 600 MG TABS [id number]  Take 1 tablet by mouth daily. [provider]  Active Self, Other  amiodarone  (PACERONE ) 200 MG tablet [id number] Yes Take 2 tablets (400 mg total) by mouth 2 (two) times daily. [place] A, FNP  Active   apixaban  (ELIQUIS ) 5 MG TABS tablet [id number]  Take 1 tablet (5 mg total) by mouth 2 (two) times daily. Caleen Qualia, MD  Active Self, Other  atorvastatin  (LIPITOR) 40 MG tablet [id number]  Take 1 tablet (40 mg total) by mouth daily. Bacigalupo, Angela M, MD  Active   cetirizine  (ZYRTEC ) 10 MG tablet [id number]  Take 10 mg by mouth daily. [provider]  Active Self, Other  Cholecalciferol (VITAMIN D-3) 25 MCG (1000 UT) CAPS [id number]  Take 1,000 Units by mouth daily.  [provider]  Active Self, Other  docusate sodium  (COLACE) 100 MG capsule [id number]  Take 1 capsule (100 mg total) by mouth 2 (two) times daily.  Patient taking differently: Take 200 mg by mouth daily.   Joshua Lin, PA-C  Active Self, Other           Med Note ASCH, CONNELLAE H   Thu Dec 21, 2023  7:30 AM)    fluticasone  (FLONASE ) 50 MCG/ACT nasal spray [id number]  PLACE 1 SPRAY INTO BOTH NOSTRILS DAILY AS NEEDED FOR ALLERGIES OR RHINITIS. Myrla Jon [name], MD  Active Self, Other  folic acid  (FOLVITE ) 1 MG tablet [id number]  Take 1 tablet (1 mg total) by mouth daily. Caleen Qualia, MD  Active Self, Other  furosemide  (LASIX ) 40 MG tablet [id number]  Take 1 tablet (40 mg total) by mouth daily. [place] A, FNP  Active   hydrocortisone  2.5 %  cream [id number]  APPLY TO AFFECTED AREA TWICE A DAY AS NEEDED FOR RASH Bacigalupo, Jon [name], MD  Active Self, Other  ketoconazole  (NIZORAL ) 2 % cream [id number]  Apply once or twice daily to affected skin folds as needed for rash Jackquline Sawyer, MD  Active Self, Other  losartan  (COZAAR ) 25 MG tablet [id number]  Take 1 tablet (25 mg total) by mouth daily. Dezii, Alexandra, DO  Active   metoprolol  succinate (TOPROL -XL) 100 MG 24 hr tablet [id number]  Take 1 tablet (100 mg total) by mouth daily. Take with or immediately following a meal. Dezii, Alexandra, DO  Active   Multiple Vitamin (MULTIVITAMIN WITH MINERALS) TABS tablet [id number]  Take 1 tablet by mouth daily. Vachhani, Vaibhavkumar, MD  Active Self, Other  omeprazole  (PRILOSEC) 20 MG capsule [id number]  TAKE 1 CAPSULE (20 MG TOTAL) BY MOUTH DAILY. REPORTS CURRENTLY TAKING AS NEEDED Bacigalupo, Jon [name], MD  Active Self, Other  potassium chloride  SA (KLOR-CON  M) 20 MEQ tablet [id number]  Take 1 tablet (20 mEq total) by mouth daily. [place] A, FNP  Active   pregabalin  (LYRICA ) 25 MG capsule [id number]  Take 25 mg by mouth 2 (two) times daily. [provider]  Active Self, Other   sertraline  (ZOLOFT ) 100 MG tablet [id number]  Take 1 tablet (100 mg total) by mouth daily. Myrla Jon [name], MD  Active Self, Other  tadalafil  (CIALIS ) 5 MG tablet [id number]  Take 1 tablet (5 mg total) by mouth daily as needed for erectile dysfunction. Bacigalupo, Angela M, MD  Active Self, Other           Med Note SIMMIE, RAVEN M   Fri Jul 07, 2023  4:09 PM) Aware to take EVERY OTHER DAY  tadalafil  (CIALIS ) 5 MG tablet [id number]  Take 1 tablet (5 mg total) by mouth every other day as needed. Donzella Lauraine SAILOR, DO  Active Self, Other  thiamine  (VITAMIN B1) 100 MG tablet [id number]  Take 100 mg by mouth daily. [provider]  Active Self, Other  vitamin B-12 (CYANOCOBALAMIN ) 1000 MCG tablet [id number]  Take 1,000 mcg by mouth daily. [provider]  Active Self, Other  zinc gluconate 50 MG tablet [id number]  Take 50 mg by mouth daily. [provider]  Active Self, Other            Recommendation:   Continue Current Plan of Care Contact PCP office for any triage needs over the weekend or after hours help.  Follow Up Plan:   Telephone follow-up in 1 week  [name], RN, BSN, CCM [facility], [facility] RN Care Manager Direct Dial: [phone]

## 2024-01-12 NOTE — Telephone Encounter (Signed)
 Advised

## 2024-01-12 NOTE — Patient Instructions (Signed)
 Visit Information  Thank you for taking time to visit with me today. Please don't hesitate to contact me if I can be of assistance to you before our next scheduled telephone appointment.  Our next appointment is by telephone on 01/19/24 at 2:30 pm   Following is a copy of your care plan:   Goals Addressed             This Visit's Progress    [facility] Transitions of Care (TOC) Care Plan       Problems:  Recent Hospitalization for treatment of Atrial Fibrillation 01/05/24 Heart Failure Patient needs ongoing assistance for ADLs sister states PCP made a referral for this. Discussed that it may be an out of pocket cost and states they are waiting for a callback from the MDs referral made today 01/05/24  Goal:  Over the next 30 days, the patient will not experience hospital readmission  Interventions:   AFIB Interventions:   Advised patient to discuss worsening symptoms with provider Continue to take medications as prescribed and follow up with [facility] regarding managing packing the medication DC instructions reviewed with sister, Norvel on AVS: Call MD for: difficulty breathing, headache or visual disturbances Call MD for: persistant dizziness or light-headedness Call MD for: persistant nausea and vomiting Call MD for: severe uncontrolled pain Call MD for: temperature >100.4 Diet general Discharge instructions Follow up with your primary care physician to discuss the medication changes during this admission. Continue close follow up with cardiology. Use your albuterol  inhaler every 4 hours as needed. Mucinex  DM for coughing and mucus production. Do NOT use Mucinex  D.  01/05/19 Review HF instructions: Record daily weight on same scale at same time of day. Reviewed last weight at hospital 209.14 oz today 212 lb at home noted. 01/12/24 Patient to continue to monitor weight daily and BP, avoid any additional sodium, read labels, add no salt but try lemon juice, onions,  peppers, garlic for added flavor to food. Try low sodium protein shakes, puddings.  Patient Self Care Activities:  Attend all scheduled provider appointments Call pharmacy for medication refills 3-7 days in advance of running out of medications Call provider office for new concerns or questions  Notify RN Care Manager of [facility] call rescheduling needs Participate in Transition of Care Program/Attend TOC scheduled calls Take medications as prescribed   01/12/24 Norvel, sister states that [facility] is suppose to start Monday for personal care services. Also, [facility] has come out and done the assessment for PT/OT home health on Monday also.  Plan:  The care management team will reach out to the patient again over the next 5-10 business days. The patient has been provided with contact information for the care management team and has been advised to call with any health related questions or concerns. Appointment made for next week for enrolled in [facility] as patient and sister requested to end call but made appointment for next week follow up. 01/12/24 Continue to restrict sodium as directed and fluid intake.        Patient verbalizes understanding of instructions and care plan provided today and agrees to view in MyChart. Active MyChart status and patient understanding of how to access instructions and care plan via MyChart confirmed with patient.     Telephone follow up appointment with care management team member scheduled for: The patient has been provided with contact information for the care management team and has been advised to call with any health related questions or concerns.  The care management team will reach out to the patient again over the next 5-10 business days.   Please call the care guide team at [phone] if you need to cancel or reschedule your appointment.   Please call the Suicide and Crisis Lifeline: 988 call the [place]  National Suicide Prevention Lifeline:  445-646-3014 or TTY: 236-311-4576 TTY 931-093-5687) to talk to a trained counselor call 1-800-273-TALK (toll free, 24 hour hotline) call 911 if you are experiencing a Mental Health or Behavioral Health Crisis or need someone to talk to.  [name], RN, BSN, CCM [facility], [facility] RN Care Manager Direct Dial: [phone]

## 2024-01-13 DIAGNOSIS — R7303 Prediabetes: Secondary | ICD-10-CM | POA: Diagnosis not present

## 2024-01-13 DIAGNOSIS — N1831 Chronic kidney disease, stage 3a: Secondary | ICD-10-CM | POA: Diagnosis not present

## 2024-01-13 DIAGNOSIS — E876 Hypokalemia: Secondary | ICD-10-CM | POA: Diagnosis not present

## 2024-01-13 DIAGNOSIS — I130 Hypertensive heart and chronic kidney disease with heart failure and stage 1 through stage 4 chronic kidney disease, or unspecified chronic kidney disease: Secondary | ICD-10-CM | POA: Diagnosis not present

## 2024-01-13 DIAGNOSIS — J42 Unspecified chronic bronchitis: Secondary | ICD-10-CM | POA: Diagnosis not present

## 2024-01-13 DIAGNOSIS — I5042 Chronic combined systolic (congestive) and diastolic (congestive) heart failure: Secondary | ICD-10-CM | POA: Diagnosis not present

## 2024-01-13 DIAGNOSIS — E785 Hyperlipidemia, unspecified: Secondary | ICD-10-CM | POA: Diagnosis not present

## 2024-01-13 DIAGNOSIS — C44629 Squamous cell carcinoma of skin of left upper limb, including shoulder: Secondary | ICD-10-CM | POA: Diagnosis not present

## 2024-01-13 DIAGNOSIS — I480 Paroxysmal atrial fibrillation: Secondary | ICD-10-CM | POA: Diagnosis not present

## 2024-01-13 DIAGNOSIS — C61 Malignant neoplasm of prostate: Secondary | ICD-10-CM | POA: Diagnosis not present

## 2024-01-13 DIAGNOSIS — I7121 Aneurysm of the ascending aorta, without rupture: Secondary | ICD-10-CM | POA: Diagnosis not present

## 2024-01-13 DIAGNOSIS — F1020 Alcohol dependence, uncomplicated: Secondary | ICD-10-CM | POA: Diagnosis not present

## 2024-01-13 DIAGNOSIS — M5416 Radiculopathy, lumbar region: Secondary | ICD-10-CM | POA: Diagnosis not present

## 2024-01-15 ENCOUNTER — Ambulatory Visit: Payer: Self-pay

## 2024-01-15 DIAGNOSIS — I5042 Chronic combined systolic (congestive) and diastolic (congestive) heart failure: Secondary | ICD-10-CM | POA: Diagnosis not present

## 2024-01-15 DIAGNOSIS — I7121 Aneurysm of the ascending aorta, without rupture: Secondary | ICD-10-CM | POA: Diagnosis not present

## 2024-01-15 DIAGNOSIS — M5416 Radiculopathy, lumbar region: Secondary | ICD-10-CM | POA: Diagnosis not present

## 2024-01-15 DIAGNOSIS — F1020 Alcohol dependence, uncomplicated: Secondary | ICD-10-CM | POA: Diagnosis not present

## 2024-01-15 DIAGNOSIS — I4891 Unspecified atrial fibrillation: Secondary | ICD-10-CM | POA: Diagnosis not present

## 2024-01-15 DIAGNOSIS — E876 Hypokalemia: Secondary | ICD-10-CM | POA: Diagnosis not present

## 2024-01-15 DIAGNOSIS — N1831 Chronic kidney disease, stage 3a: Secondary | ICD-10-CM | POA: Diagnosis not present

## 2024-01-15 DIAGNOSIS — E785 Hyperlipidemia, unspecified: Secondary | ICD-10-CM | POA: Diagnosis not present

## 2024-01-15 DIAGNOSIS — Z008 Encounter for other general examination: Secondary | ICD-10-CM | POA: Diagnosis not present

## 2024-01-15 DIAGNOSIS — I509 Heart failure, unspecified: Secondary | ICD-10-CM | POA: Diagnosis not present

## 2024-01-15 DIAGNOSIS — R7303 Prediabetes: Secondary | ICD-10-CM | POA: Diagnosis not present

## 2024-01-15 DIAGNOSIS — C44629 Squamous cell carcinoma of skin of left upper limb, including shoulder: Secondary | ICD-10-CM | POA: Diagnosis not present

## 2024-01-15 DIAGNOSIS — J42 Unspecified chronic bronchitis: Secondary | ICD-10-CM | POA: Diagnosis not present

## 2024-01-15 DIAGNOSIS — I480 Paroxysmal atrial fibrillation: Secondary | ICD-10-CM | POA: Diagnosis not present

## 2024-01-15 DIAGNOSIS — I130 Hypertensive heart and chronic kidney disease with heart failure and stage 1 through stage 4 chronic kidney disease, or unspecified chronic kidney disease: Secondary | ICD-10-CM | POA: Diagnosis not present

## 2024-01-15 DIAGNOSIS — C61 Malignant neoplasm of prostate: Secondary | ICD-10-CM | POA: Diagnosis not present

## 2024-01-15 NOTE — Telephone Encounter (Signed)
 FYI Only or Action Required?: FYI only for provider.  Patient was last seen in primary care on 01/11/2024 by Myrla Jon [name], MD.  Called Nurse Triage reporting Urinary Tract Infection.  Symptoms began yesterday.  Interventions attempted: Rest, hydration, or home remedies.  Symptoms are: unchanged.  Triage Disposition: See PCP Within 2 Weeks  Patient/caregiver understands and will follow disposition?: Yes, will follow disposition  Message from Frankford D sent at 01/15/2024  9:03 AM EDT  Pt sister Norvel is calling states pt urine is really brown and thick and concentrated when urinating.  Pt has kidney issues sister is worried and would like a call back [phone]. Pt is with sister so you can call pt home or sister cell # that is in message   Reason for Disposition  All other urine symptoms  Answer Assessment - Initial Assessment Questions 1. SYMPTOM: What's the main symptom you're concerned about? (e.g., frequency, incontinence)     Dark urine, thick 2. ONSET: When did the  dark, thick urine  start?     yesterday 3. PAIN: Is there any pain? If Yes, ask: How bad is it? (Scale: 1-10; mild, moderate, severe)     denies 4. CAUSE: What do you think is causing the symptoms?     Sister states that pt  may have a UTI, but is unsure as he is also on a fluid restrictions and is only able to drink 64 oz, sister did increase water  intake for the pt 5. OTHER SYMPTOMS: Do you have any other symptoms? (e.g., blood in urine, fever, flank pain, pain with urination)     Denies s/s other than the dark urine  Pt sister/caller states that pt is on fluid restrictions per cardiology. Per sister pt has had a 3 lb weight gain over night, RN advised sister to call cardiology about weight gain, Norvel agreeable. Pt scheduled for dark urine. Sister states that with more water  intake the urine is less dark. Pt denies pain at this time.  Protocols used: Urinary Symptoms-A-AH

## 2024-01-16 ENCOUNTER — Ambulatory Visit: Admitting: Family Medicine

## 2024-01-16 ENCOUNTER — Telehealth: Payer: Self-pay

## 2024-01-16 DIAGNOSIS — I480 Paroxysmal atrial fibrillation: Secondary | ICD-10-CM | POA: Diagnosis not present

## 2024-01-16 DIAGNOSIS — R7303 Prediabetes: Secondary | ICD-10-CM | POA: Diagnosis not present

## 2024-01-16 DIAGNOSIS — M5416 Radiculopathy, lumbar region: Secondary | ICD-10-CM | POA: Diagnosis not present

## 2024-01-16 DIAGNOSIS — E876 Hypokalemia: Secondary | ICD-10-CM | POA: Diagnosis not present

## 2024-01-16 DIAGNOSIS — I5042 Chronic combined systolic (congestive) and diastolic (congestive) heart failure: Secondary | ICD-10-CM | POA: Diagnosis not present

## 2024-01-16 DIAGNOSIS — F1020 Alcohol dependence, uncomplicated: Secondary | ICD-10-CM | POA: Diagnosis not present

## 2024-01-16 DIAGNOSIS — N1831 Chronic kidney disease, stage 3a: Secondary | ICD-10-CM | POA: Diagnosis not present

## 2024-01-16 DIAGNOSIS — E785 Hyperlipidemia, unspecified: Secondary | ICD-10-CM | POA: Diagnosis not present

## 2024-01-16 DIAGNOSIS — C61 Malignant neoplasm of prostate: Secondary | ICD-10-CM | POA: Diagnosis not present

## 2024-01-16 DIAGNOSIS — J42 Unspecified chronic bronchitis: Secondary | ICD-10-CM | POA: Diagnosis not present

## 2024-01-16 DIAGNOSIS — I7121 Aneurysm of the ascending aorta, without rupture: Secondary | ICD-10-CM | POA: Diagnosis not present

## 2024-01-16 DIAGNOSIS — I130 Hypertensive heart and chronic kidney disease with heart failure and stage 1 through stage 4 chronic kidney disease, or unspecified chronic kidney disease: Secondary | ICD-10-CM | POA: Diagnosis not present

## 2024-01-16 DIAGNOSIS — C44629 Squamous cell carcinoma of skin of left upper limb, including shoulder: Secondary | ICD-10-CM | POA: Diagnosis not present

## 2024-01-16 NOTE — Progress Notes (Signed)
 Called and spoke with pt's sister. Notified her that furosemide  is the only medication pt needs to hold for the day of Cardioversion. Pt can continue to take blood thinner during and after procedure per Laymon Gosling, RN.  Pt's sister had good understanding and no further questions.

## 2024-01-16 NOTE — Progress Notes (Signed)
 I have reviewed the case manager/PharmD/LCSW's note, was available for consultation, and agree with documentation and plan  Donzella Lauraine LOISE ROSALEA, DO [facility] 01/16/2024 12:59 PM

## 2024-01-17 ENCOUNTER — Other Ambulatory Visit: Payer: Self-pay

## 2024-01-17 ENCOUNTER — Encounter: Payer: Self-pay | Admitting: Cardiology

## 2024-01-17 ENCOUNTER — Encounter
Admission: RE | Disposition: A | Discharge status: Home / Self Care | Payer: Self-pay | Source: Home / Self Care | Attending: Cardiology

## 2024-01-17 ENCOUNTER — Ambulatory Visit: Admitting: Anesthesiology

## 2024-01-17 ENCOUNTER — Ambulatory Visit
Admission: RE | Admit: 2024-01-17 | Discharge: 2024-01-17 | Disposition: A | Discharge status: Home / Self Care | Attending: Cardiology | Admitting: Cardiology

## 2024-01-17 DIAGNOSIS — I4891 Unspecified atrial fibrillation: Secondary | ICD-10-CM | POA: Insufficient documentation

## 2024-01-17 DIAGNOSIS — Z87891 Personal history of nicotine dependence: Secondary | ICD-10-CM | POA: Insufficient documentation

## 2024-01-17 DIAGNOSIS — I130 Hypertensive heart and chronic kidney disease with heart failure and stage 1 through stage 4 chronic kidney disease, or unspecified chronic kidney disease: Secondary | ICD-10-CM | POA: Diagnosis not present

## 2024-01-17 DIAGNOSIS — G4730 Sleep apnea, unspecified: Secondary | ICD-10-CM | POA: Insufficient documentation

## 2024-01-17 DIAGNOSIS — I2510 Atherosclerotic heart disease of native coronary artery without angina pectoris: Secondary | ICD-10-CM | POA: Diagnosis not present

## 2024-01-17 DIAGNOSIS — I4819 Other persistent atrial fibrillation: Secondary | ICD-10-CM

## 2024-01-17 DIAGNOSIS — N1831 Chronic kidney disease, stage 3a: Secondary | ICD-10-CM | POA: Diagnosis not present

## 2024-01-17 DIAGNOSIS — Z7901 Long term (current) use of anticoagulants: Secondary | ICD-10-CM | POA: Insufficient documentation

## 2024-01-17 DIAGNOSIS — I5023 Acute on chronic systolic (congestive) heart failure: Secondary | ICD-10-CM | POA: Diagnosis not present

## 2024-01-17 HISTORY — PX: CARDIOVERSION: SHX1299

## 2024-01-17 SURGERY — CARDIOVERSION
Anesthesia: General

## 2024-01-17 MED ORDER — ONDANSETRON HCL 4 MG/2ML IJ SOLN
INTRAMUSCULAR | Status: AC
  Filled 2024-01-17: qty 2

## 2024-01-17 MED ORDER — EPHEDRINE SULFATE-NACL 50-0.9 MG/10ML-% IV SOSY
PREFILLED_SYRINGE | INTRAVENOUS | Status: DC | PRN
  Administered 2024-01-17: 5 mg via INTRAVENOUS
  Administered 2024-01-17 (×2): 2.5 mg via INTRAVENOUS

## 2024-01-17 MED ORDER — LIDOCAINE HCL (CARDIAC) PF 100 MG/5ML IV SOSY
PREFILLED_SYRINGE | INTRAVENOUS | Status: DC | PRN
  Administered 2024-01-17: 100 mg via INTRAVENOUS

## 2024-01-17 MED ORDER — PROPOFOL 10 MG/ML IV BOLUS
INTRAVENOUS | Status: DC | PRN
Start: 2024-01-17 — End: 2024-01-17
  Administered 2024-01-17: 60 mg via INTRAVENOUS

## 2024-01-17 MED ORDER — SODIUM CHLORIDE 0.9 % IV SOLN: INTRAVENOUS | Status: DC

## 2024-01-17 MED ORDER — AMIODARONE HCL 200 MG PO TABS
200.0000 mg | ORAL_TABLET | Freq: Every day | ORAL | 5 refills | Status: DC
  Filled 2024-01-17: qty 100, 100d supply, fill #0

## 2024-01-17 MED ORDER — PHENYLEPHRINE HCL (PRESSORS) 10 MG/ML IV SOLN
INTRAVENOUS | Status: DC | PRN
  Administered 2024-01-17 (×3): 80 ug via INTRAVENOUS

## 2024-01-17 MED ORDER — PROPOFOL 10 MG/ML IV BOLUS
INTRAVENOUS | Status: AC
  Filled 2024-01-17: qty 40

## 2024-01-17 MED ORDER — LIDOCAINE HCL (PF) 2 % IJ SOLN
INTRAMUSCULAR | Status: AC
  Filled 2024-01-17: qty 5

## 2024-01-17 MED ORDER — DEXAMETHASONE SODIUM PHOSPHATE 10 MG/ML IJ SOLN
INTRAMUSCULAR | Status: AC
  Filled 2024-01-17: qty 1

## 2024-01-17 NOTE — Interval H&P Note (Signed)
 History and Physical Interval Note:  01/17/2024 7:38 AM  Cameron Foster.  has presented today for surgery, with the diagnosis of Cardioversion   Afib.  The various methods of treatment have been discussed with the patient and family. After consideration of risks, benefits and other options for treatment, the patient has consented to  Procedure(s): CARDIOVERSION (N/A) as a surgical intervention.  The patient's history has been reviewed, patient examined, no change in status, stable for surgery.  I have reviewed the patient's chart and labs.  Questions were answered to the patient's satisfaction.     Cameron Foster

## 2024-01-17 NOTE — Transfer of Care (Signed)
 Immediate Anesthesia Transfer of Care Note  Patient: Cameron Foster.  Procedure(s) Performed: CARDIOVERSION  Patient Location: Short Stay  Anesthesia Type:General  Level of Consciousness: drowsy and patient cooperative  Airway & Oxygen Therapy: Patient Spontanous Breathing and Patient connected to nasal cannula oxygen  Post-op Assessment: Report given to RN and Post -op Vital signs reviewed and stable  Post vital signs: Reviewed and stable  Last Vitals:  Vitals Value Taken Time  BP 112/78 01/17/24 07:58  Temp 37 C 01/17/24 07:58  Pulse 73 01/17/24 07:58  Resp 22 01/17/24 07:58  SpO2 98 % 01/17/24 07:58    Last Pain:  Vitals:   01/17/24 0758  TempSrc: Tympanic  PainSc: 0-No pain         Complications: No notable events documented.

## 2024-01-17 NOTE — Anesthesia Procedure Notes (Signed)
 Procedure Name: MAC Date/Time: 01/17/2024 7:41 AM  Performed by: Lorrene Camelia LABOR, CRNAPre-anesthesia Checklist: Patient identified, Emergency Drugs available, Suction available and Patient being monitored Patient Re-evaluated:Patient Re-evaluated prior to induction Oxygen Delivery Method: Nasal cannula Preoxygenation: Pre-oxygenation with 100% oxygen Induction Type: IV induction

## 2024-01-17 NOTE — Anesthesia Postprocedure Evaluation (Signed)
 Anesthesia Post Note  Patient: Cameron Foster.  Procedure(s) Performed: CARDIOVERSION  Patient location during evaluation: PACU Anesthesia Type: General Level of consciousness: awake and alert Pain management: pain level controlled Vital Signs Assessment: post-procedure vital signs reviewed and stable Respiratory status: spontaneous breathing, nonlabored ventilation, respiratory function stable and patient connected to nasal cannula oxygen Cardiovascular status: blood pressure returned to baseline and stable Postop Assessment: no apparent nausea or vomiting Anesthetic complications: no   No notable events documented.   Last Vitals:  Vitals:   01/17/24 0843 01/17/24 0844  BP: 126/88   Pulse: 63 62  Resp: (!) 21 20  Temp:  36.4 C  SpO2: 93% 93%    Last Pain:  Vitals:   01/17/24 0844  TempSrc: Temporal  PainSc:                  Prentice Murphy

## 2024-01-17 NOTE — Anesthesia Preprocedure Evaluation (Signed)
 Anesthesia Evaluation  Patient identified by MRN, date of birth, ID band Patient awake    Reviewed: Allergy & Precautions, NPO status , Patient's Chart, lab work & pertinent test results  History of Anesthesia Complications Negative for: history of anesthetic complications  Airway Mallampati: III  TM Distance: >3 FB Neck ROM: Full    Dental  (+) Partial Upper, Poor Dentition, Dental Advidsory Given, Missing   Pulmonary shortness of breath (from a fib) and with exertion, sleep apnea , neg COPD, neg recent URI, Patient abstained from smoking.Not current smoker, former smoker   Pulmonary exam normal breath sounds clear to auscultation       Cardiovascular Exercise Tolerance: Good METShypertension, Pt. on medications +CHF  (-) CAD and (-) Past MI + dysrhythmias Atrial Fibrillation (-) Valvular Problems/Murmurs Rhythm:Regular Rate:Bradycardia - Systolic murmurs Echo 12/22/23: 1. Left ventricular ejection fraction, by estimation, is 55 to 60%. Left ventricular ejection fraction by PLAX is 66 %. The left ventricle has normal function. The left ventricle has no regional wall motion abnormalities. There is mild left ventricular hypertrophy. Left ventricular diastolic parameters are indeterminate.   2. Right ventricular systolic function is normal. The right ventricular size is normal.   3. The mitral valve is normal in structure. No evidence of mitral valve regurgitation.   4. The aortic valve was not well visualized. Aortic valve regurgitation is not visualized.    Neuro/Psych neg Seizures PSYCHIATRIC DISORDERS  Depression     Neuromuscular disease CVA, No Residual Symptoms    GI/Hepatic PUD,GERD  Controlled,,(+)     (-) substance abuse    Endo/Other  neg diabetes    Renal/GU CRFRenal disease     Musculoskeletal  (+) Arthritis ,    Abdominal   Peds  Hematology   Anesthesia Other Findings Past Medical History: No date:  Alcoholism (HCC) No date: Aortic atherosclerosis (HCC) No date: Arthritis 03/28/2018: Ascending aorta dilation (HCC)     Comment:  a.) Vascular US : prox asc Ao measured 29 mm. b.)  CT CAP              06/22/2021: Ao root 41 mm. c.) TTE 06/26/2021: Ao root 41              mm; asc Ao 39 mm 1995: Atrial fibrillation (HCC)     Comment:  a.) single episode in 1995 per patient; no long term               treatment. b.) recurrent episode in the setting of GI               bleeding related to colitis 06/2021. 04/26/2017: Basal cell carcinoma     Comment:  Right medial cheek. Superficial and nodular 06/11/2019: Basal cell carcinoma     Comment:  Left anterior shoulder. Nodular pattern 09/09/2019: Basal cell carcinoma     Comment:  Right nasal ala, EDC 02/05/2020: Basal cell carcinoma     Comment:  L upper eyebrow, EDC  06/09/2019: Bilateral carotid artery stenosis     Comment:  a.) Carotid doppler: mod; <50% BILATERAL ICAs. No date: Chicken pox No date: Colon cancer (HCC) No date: Colon polyp No date: Depression     Comment:  Son died January 25, 2015 No date: Diverticulitis 30 years: Diverticulosis No date: Gastritis No date: GERD (gastroesophageal reflux disease) No date: H. pylori infection No date: History of stress test     Comment:  a. 09/2015 MV: No ischemia/infarct. EF 45-54% (nl by  echo). No date: Hyperlipidemia No date: Hypertension No date: Irritable bowel syndrome 06/08/2019: Lacunar infarction [facility])     Comment:  a.) small; RIGHT motor strip No date: NSVT (nonsustained ventricular tachycardia) (HCC)     Comment:  a.)  Single episode lasting 5 beats at a maximum rate of              160 bpm noted on Holter study performed 08/13/2021. 2012: Prostate cancer (HCC)     Comment:  a.) s/p XRT No date: PSVT (paroxysmal supraventricular tachycardia) (HCC)     Comment:  a. 06/2019 Zio: Avg rate 74 (54-120), occas PACs, rare               PVCs, 125 episodes of PVCs (longest  17.5 secs; max rate               187). No afib. No date: RBBB 10/10/2018: SAH (subarachnoid hemorrhage) (HCC) 07/18/2017: Squamous cell carcinoma of skin     Comment:  Left medial calf. KA type 04/24/2018: Squamous cell carcinoma of skin     Comment:  Right above med. brow 06/11/2019: Squamous cell carcinoma of skin     Comment:  Right posterior shoulder. SCCis, hypertrophic 01/09/2020: Squamous cell carcinoma of skin     Comment:  Mid nasal dorsum, MOHS, Efudex x 4wks No date: Systolic dysfunction     Comment:  a.) TTE 10/05/2015: EF 50-55%; mild LVH; LAE; triv AR,               mild MR. b.) TTE 03/29/2018: EF 55-60%; LAE, mild AR; ?               small PFO. c.) TTE 06/09/2019: EF 55-60%, no rwma, triv               MR/AI. d.) TTE 06/26/2021: EF 45-50%; glob HK; LAE; mild               MR; Ao root 41 mm; asc Ao 39 mm. No date: Vasovagal syncope  Reproductive/Obstetrics                              Anesthesia Physical Anesthesia Plan  ASA: 3  Anesthesia Plan: General   Post-op Pain Management: Minimal or no pain anticipated   Induction: Intravenous  PONV Risk Score and Plan: 1 and Propofol  infusion, TIVA and Treatment may vary due to age or medical condition  Airway Management Planned: Natural Airway and Nasal Cannula  Additional Equipment: None  Intra-op Plan:   Post-operative Plan:   Informed Consent: I have reviewed the patients History and Physical, chart, labs and discussed the procedure including the risks, benefits and alternatives for the proposed anesthesia with the patient or authorized representative who has indicated his/her understanding and acceptance.       Plan Discussed with: CRNA and Surgeon  Anesthesia Plan Comments: (Discussed the role of CRNA in patient's perioperative care.  Patient voiced understanding.)         Anesthesia Quick Evaluation

## 2024-01-17 NOTE — Procedures (Signed)
   DIRECT CURRENT CARDIOVERSION  NAME:  Cameron Foster.    MRN: [id number] DOB:  1941/04/15    ADMIT DATE: 01/17/2024  CARDIOVERSION:     Indications:  Symptomatic Atrial Fibrillation  Informed consent was obtained prior to the procedure. The risks, benefits and alternatives for the procedure were discussed and the patient comprehended these risks.  Risks include, but are not limited to treatment failure, burns to the chest, pain/discomfort, ventricular arrhythmia.   After a procedural time-out, sedation was performed by anesthesia. The patient had the defibrillator pads placed in the anterior and posterior position. Once an appropriate level of sedation was confirmed, the patient was cardioverted successfully with 200J of biphasic synchronized energy.  The patient converted to NSR.  There were no apparent complications.  The patient had normal neuro status and respiratory status post procedure with vitals stable as recorded elsewhere.  Adequate airway was maintained throughout and vital signs monitored per protocol.  COMPLICATIONS:    Complications: No complications Patient tolerated procedure well.  Morene Brownie Advanced Heart Failure 7:46 AM

## 2024-01-18 ENCOUNTER — Telehealth: Payer: Self-pay

## 2024-01-18 ENCOUNTER — Encounter: Payer: Self-pay | Admitting: Cardiology

## 2024-01-18 NOTE — Progress Notes (Signed)
 Pt's sister called to set up pt's 1 week follow-up appt since he had a Cardioversion yesterday. Scheduled pt for 01/23/24 @ 3:00 PM.  Pt's sister also stated pt has gained 2 lbs since yesterday and has been having a lot of Sob. Pt currently taking 40 MG every day of Lasix  and 20 MEQ of Potassium.  Notified Ellouise Class, FNP. She advises pt take 2- 40 MG tablets daily and 2- 20 MEQ tablets daily until next ofv 01/23/24. If pt feels dehydrated or depleting form doing this new med change after a few days, pt can decrease back to original dosages. If pt tolerates med change well, pt can continue this change until his next ofv 01/23/24.  Notified pt's sister regarding medication changes per Ellouise Class, FNP. Pt's sister had good understanding and on further questions.

## 2024-01-19 ENCOUNTER — Telehealth: Payer: Self-pay

## 2024-01-19 ENCOUNTER — Other Ambulatory Visit: Payer: Self-pay

## 2024-01-19 DIAGNOSIS — C61 Malignant neoplasm of prostate: Secondary | ICD-10-CM | POA: Diagnosis not present

## 2024-01-19 DIAGNOSIS — I130 Hypertensive heart and chronic kidney disease with heart failure and stage 1 through stage 4 chronic kidney disease, or unspecified chronic kidney disease: Secondary | ICD-10-CM | POA: Diagnosis not present

## 2024-01-19 DIAGNOSIS — F1020 Alcohol dependence, uncomplicated: Secondary | ICD-10-CM | POA: Diagnosis not present

## 2024-01-19 DIAGNOSIS — R7303 Prediabetes: Secondary | ICD-10-CM | POA: Diagnosis not present

## 2024-01-19 DIAGNOSIS — I5042 Chronic combined systolic (congestive) and diastolic (congestive) heart failure: Secondary | ICD-10-CM | POA: Diagnosis not present

## 2024-01-19 DIAGNOSIS — M5416 Radiculopathy, lumbar region: Secondary | ICD-10-CM | POA: Diagnosis not present

## 2024-01-19 DIAGNOSIS — I7121 Aneurysm of the ascending aorta, without rupture: Secondary | ICD-10-CM | POA: Diagnosis not present

## 2024-01-19 DIAGNOSIS — J42 Unspecified chronic bronchitis: Secondary | ICD-10-CM | POA: Diagnosis not present

## 2024-01-19 DIAGNOSIS — N1831 Chronic kidney disease, stage 3a: Secondary | ICD-10-CM | POA: Diagnosis not present

## 2024-01-19 DIAGNOSIS — C44629 Squamous cell carcinoma of skin of left upper limb, including shoulder: Secondary | ICD-10-CM | POA: Diagnosis not present

## 2024-01-19 DIAGNOSIS — Z008 Encounter for other general examination: Secondary | ICD-10-CM | POA: Diagnosis not present

## 2024-01-19 DIAGNOSIS — E785 Hyperlipidemia, unspecified: Secondary | ICD-10-CM | POA: Diagnosis not present

## 2024-01-19 DIAGNOSIS — E876 Hypokalemia: Secondary | ICD-10-CM | POA: Diagnosis not present

## 2024-01-19 DIAGNOSIS — I509 Heart failure, unspecified: Secondary | ICD-10-CM | POA: Diagnosis not present

## 2024-01-19 DIAGNOSIS — I480 Paroxysmal atrial fibrillation: Secondary | ICD-10-CM | POA: Diagnosis not present

## 2024-01-19 NOTE — Transitions of Care (Post Inpatient/ED Visit) (Signed)
 Transition of Care week 3  Visit Note  01/19/2024  Name: Cameron Foster. MRN: [id number]          DOB: 1940/06/25  Situation: Patient enrolled in [facility] 30-day program. Visit completed with patient by telephone.   Background:   Initial Transition Care Management Follow-up Telephone Call    Past Medical History:  Diagnosis Date   Alcoholism [facility]    Allergy    Aortic atherosclerosis    Arthritis    Ascending aorta dilation 03/28/2018   a.) Vascular US : prox asc Ao measured 29 mm. b.)  CT CAP 06/22/2021: Ao root 41 mm. c.) TTE 06/26/2021: Ao root 41 mm; asc Ao 39 mm   Atrial fibrillation (HCC) 1995   a.) single episode in 1995 per patient; no long term treatment. b.) recurrent episode in the setting of GI bleeding related to colitis 06/2021.   Basal cell carcinoma 04/26/2017   Right medial cheek. Superficial and nodular   Basal cell carcinoma 06/11/2019   Left anterior shoulder. Nodular pattern   Basal cell carcinoma 09/09/2019   Right nasal ala, EDC   Basal cell carcinoma 02/05/2020   L upper eyebrow, EDC    Basal cell carcinoma 10/12/2022   left post auricular, EDC   Basal cell carcinoma 05/02/2023   Right medial cheek, EDC   Bilateral carotid artery stenosis 06/09/2019   a.) Carotid doppler: mod; <50% BILATERAL ICAs.   Cataract    Chicken pox    Colon cancer (HCC)    Colon polyp    Depression    Son died 02/10/15   Diverticulitis    Diverticulosis 30 years   Gastritis    GERD (gastroesophageal reflux disease)    H. pylori infection    History of stress test    a. 09/2015 MV: No ischemia/infarct. EF 45-54% (nl by echo).   Hyperlipidemia    Hypertension    Irritable bowel syndrome    Lacunar infarction (HCC) 06/08/2019   a.) small; RIGHT motor strip   Myocardial infarction [facility])    Neuromuscular disorder (HCC)    NSVT (nonsustained ventricular tachycardia) (HCC)    a.)  Single episode lasting 5 beats at a maximum rate of 160 bpm noted on Holter study performed  08/13/2021.   Prostate cancer (HCC) 2011-02-10   a.) s/p XRT   PSVT (paroxysmal supraventricular tachycardia)    a. 06/2019 Zio: Avg rate 74 (54-120), occas PACs, rare PVCs, 125 episodes of PVCs (longest 17.5 secs; max rate 187). No afib.   RBBB    SAH (subarachnoid hemorrhage) (HCC) 10/10/2018   Sleep apnea    Squamous cell carcinoma of skin 07/18/2017   Left medial calf. KA type   Squamous cell carcinoma of skin 04/24/2018   Right above med. brow   Squamous cell carcinoma of skin 06/11/2019   Right posterior shoulder. SCCis, hypertrophic   Squamous cell carcinoma of skin 01/09/2020   Mid nasal dorsum, MOHS, Efudex x 4wks   Stroke [facility])    Substance abuse (HCC)    Systolic dysfunction    a.) TTE 10/05/2015: EF 50-55%; mild LVH; LAE; triv AR, mild MR. b.) TTE 03/29/2018: EF 55-60%; LAE, mild AR; ? small PFO. c.) TTE 06/09/2019: EF 55-60%, no rwma, triv MR/AI. d.) TTE 06/26/2021: EF 45-50%; glob HK; LAE; mild MR; Ao root 41 mm; asc Ao 39 mm.   Vasovagal syncope     Assessment: Patient Reported Symptoms: Cognitive Cognitive Status: No symptoms reported, Able to follow simple commands, Alert and  oriented to person, place, and time, Normal speech and language skills      Neurological Neurological Review of Symptoms: No symptoms reported    HEENT HEENT Symptoms Reported: Nasal discharge (I have to sit up in a chair to sleep because it feel like it's choking me and I can't wear my CPAP either because it makes me feel like I am suffocating.)      Cardiovascular Cardiovascular Symptoms Reported: Fatigue Does patient have uncontrolled Hypertension?: No Cardiovascular Management Strategies: Activity, Adequate rest, Diet modification, Fluid modification, Medication therapy, Medical device, Routine screening Weight: 216 lb (98 kg)  Respiratory Respiratory Symptoms Reported: Productive cough, Shortness of breath, Wheezing Other Respiratory Symptoms: post nasal drip Respiratory Management  Strategies: Adequate rest, CPAP (Adequate rest; Breathing techniques; Medication therapy  Mucinex  DM - sitting up) Respiratory Self-Management Outcome: 2 (bad)  Endocrine Endocrine Symptoms Reported: No symptoms reported    Gastrointestinal Gastrointestinal Symptoms Reported: No symptoms reported      Genitourinary Genitourinary Symptoms Reported: No symptoms reported    Integumentary Integumentary Symptoms Reported: No symptoms reported    Musculoskeletal Musculoskelatal Symptoms Reviewed: Weakness, Unsteady gait Musculoskeletal Management Strategies: Activity, Adequate rest      Psychosocial Psychosocial Symptoms Reported: No symptoms reported (states I am feeling better and finally got some help and therapy helping me just wish my strength gets better)         There were no vitals filed for this visit.  Medications Reviewed Today     Reviewed by Eilleen Richerd GRADE, RN (Registered Nurse) on 01/19/24 at 1543  Med List Status: <None>   Medication Order Taking? Sig Documenting Provider Last Dose Status Informant  albuterol  (VENTOLIN  HFA) 108 (90 Base) MCG/ACT inhaler [id number]  Inhale 2 puffs into the lungs every 6 (six) hours as needed for wheezing or shortness of breath. Levander Slate, MD  Active Family Member           Med Note Weir, RICHERD GRADE Debar Dec 26, 2023  2:22 PM) Patient has on hand but not taking until needed  Alpha-Lipoic Acid 600 MG TABS [id number]  Take 1 tablet by mouth daily. [provider]  Active Family Member  amiodarone  (PACERONE ) 200 MG tablet [id number]  Take 1 tablet (200 mg total) by mouth daily. Zenaida Morene PARAS, MD  Active   apixaban  (ELIQUIS ) 5 MG TABS tablet [id number]  Take 1 tablet (5 mg total) by mouth 2 (two) times daily. Caleen Qualia, MD  Active Family Member  atorvastatin  (LIPITOR) 40 MG tablet [id number]  Take 1 tablet (40 mg total) by mouth daily. Myrla Jon [name], MD  Active Family Member  cetirizine  (ZYRTEC ) 10 MG tablet [id number]   Take 10 mg by mouth daily. [provider]  Active Family Member  Cholecalciferol (VITAMIN D-3) 25 MCG (1000 UT) CAPS [id number]  Take 1,000 Units by mouth daily. [provider]  Active Family Member  dextromethorphan -guaiFENesin  (MUCINEX  DM) 30-600 MG 12hr tablet [id number]  Take 1 tablet by mouth 2 (two) times daily as needed for cough. [provider]  Active Family Member  docusate sodium  (COLACE) 100 MG capsule [id number]  Take 1 capsule (100 mg total) by mouth 2 (two) times daily.  Patient taking differently: Take 200 mg by mouth daily.   Joshua Lin, PA-C  Active Family Member           Med Note TANZI, [facility]   Thu Dec 21, 2023  7:30 AM)    fluticasone  (FLONASE ) 50 MCG/ACT nasal  spray [id number]  PLACE 1 SPRAY INTO BOTH NOSTRILS DAILY AS NEEDED FOR ALLERGIES OR RHINITIS. Myrla Jon [name], MD  Active Family Member  folic acid  (FOLVITE ) 1 MG tablet [id number]  Take 1 tablet (1 mg total) by mouth daily. Caleen Qualia, MD  Active Family Member  furosemide  (LASIX ) 40 MG tablet [id number] Yes Take 1 tablet (40 mg total) by mouth daily.  Patient taking differently: Take 40 mg by mouth daily. Per sister Norvel and note from 01/18/24 Patient was instructed to take (2) two 40 mg tablets until 01/23/24 appointment with [facility] by Ellouise Donette Donette Ellouise DELENA, FNP  Active Family Member  hydrocortisone  2.5 % cream [id number]  APPLY TO AFFECTED AREA TWICE A DAY AS NEEDED FOR RASH Bacigalupo, Jon [name], MD  Active Family Member  ketoconazole  (NIZORAL ) 2 % cream [id number]  Apply once or twice daily to affected skin folds as needed for rash Jackquline Sawyer, MD  Active Family Member  losartan  (COZAAR ) 25 MG tablet [id number]  Take 1 tablet (25 mg total) by mouth daily. Dezii, Alexandra, DO  Active Family Member  metoprolol  succinate (TOPROL -XL) 100 MG 24 hr tablet [id number]  Take 1 tablet (100 mg total) by mouth daily. Take with or immediately following a meal. Dezii, Alexandra, DO   Active Family Member  Multiple Vitamin (MULTIVITAMIN WITH MINERALS) TABS tablet [id number]  Take 1 tablet by mouth daily. Vachhani, Vaibhavkumar, MD  Active Family Member  omeprazole  (PRILOSEC) 20 MG capsule [id number] Yes TAKE 1 CAPSULE (20 MG TOTAL) BY MOUTH DAILY. REPORTS CURRENTLY TAKING AS NEEDED  Patient taking differently: Take 20 mg by mouth daily. Per sister and note 01/18/24 patient to take 2- 20 MEQ tablets daily until next ofv 01/23/24 states per Ellouise Donette from [facility]   Bacigalupo, Jon [name], MD  Active Family Member  potassium chloride  SA (KLOR-CON  M) 20 MEQ tablet [id number]  Take 1 tablet (20 mEq total) by mouth daily. Donette Ellouise DELENA, FNP  Active Family Member  pregabalin  (LYRICA ) 25 MG capsule [id number]  Take 25 mg by mouth 2 (two) times daily. [provider]  Active Family Member  sertraline  (ZOLOFT ) 100 MG tablet [id number]  Take 1 tablet (100 mg total) by mouth daily. Myrla Jon [name], MD  Active Family Member  tadalafil  (CIALIS ) 5 MG tablet [id number]  Take 1 tablet (5 mg total) by mouth daily as needed for erectile dysfunction.  Patient not taking: Reported on 01/15/2024   Myrla Jon [name], MD  Active Family Member           Med Note [facility], RAVEN M   Fri Jul 07, 2023  4:09 PM) Aware to take EVERY OTHER DAY  tadalafil  (CIALIS ) 5 MG tablet [id number]  Take 1 tablet (5 mg total) by mouth every other day as needed.  Patient taking differently: Take 5 mg by mouth every other day.   Donzella Lauraine SAILOR, DO  Active Family Member  thiamine  (VITAMIN B1) 100 MG tablet [id number]  Take 100 mg by mouth daily. [provider]  Active Family Member  vitamin B-12 (CYANOCOBALAMIN ) 1000 MCG tablet [id number]  Take 1,000 mcg by mouth daily. [provider]  Active Family Member  zinc gluconate 50 MG tablet [id number]  Take 50 mg by mouth daily. [provider]  Active Family Member            Recommendation:   Continue Current Plan of Care  Follow Up  Plan:   Telephone follow-up in 1 week  [name],  RN, BSN, CCM [place]  [facility], [facility] RN Care Manager Direct Dial: [phone]

## 2024-01-19 NOTE — Patient Instructions (Signed)
 Visit Information  Thank you for taking time to visit with me today. Please don't hesitate to contact me if I can be of assistance to you before our next scheduled telephone appointment.  Our next appointment is by telephone on 01/26/24 at 1530  Following is a copy of your care plan:   Goals Addressed             This Visit's Progress    VBCI Transitions of Care (TOC) Care Plan       Problems:  Recent Hospitalization for treatment of Atrial Fibrillation 01/05/24 Heart Failure Patient needs ongoing assistance for ADLs sister states PCP made a referral for this. Discussed that it may be an out of pocket cost and states they are waiting for a callback from the MDs referral made today 01/05/24 01/19/24 Patient an overnight weight gain and medications were adjusted with Lasix  and Potassium from HF clinic.  Patient to stay on regimen until 01/23/24 appointment unless he starts to feel dehydrated per notes.  Goal:  Over the next 30 days, the patient will not experience hospital readmission  Interventions:   AFIB Interventions:   Advised patient to discuss worsening symptoms with provider Continue to take medications as prescribed and follow up with [facility] regarding managing packing the medication DC instructions reviewed with sister, Norvel on AVS: Call MD for: difficulty breathing, headache or visual disturbances Call MD for: persistant dizziness or light-headedness Call MD for: persistant nausea and vomiting Call MD for: severe uncontrolled pain Call MD for: temperature >100.4 Diet general Discharge instructions Follow up with your primary care physician to discuss the medication changes during this admission. Continue close follow up with cardiology. Use your albuterol  inhaler every 4 hours as needed. Mucinex  DM for coughing and mucus production. Do NOT use Mucinex  D.  01/19/24  Review HF instructions: Record daily weight on same scale at same time of day.  Reviewed last weight at hospital 209.14 oz today 212 lb at home noted. 01/12/24 Patient to continue to monitor weight daily and BP, avoid any additional sodium, read labels, add no salt but try lemon juice, onions, peppers, garlic for added flavor to food. Try low sodium protein shakes, puddings. 01/19/24 -  01/18/24 Sister states she notified the HF office and Ellouise Class, FNP. Medications adjusted until appointment 01/23/24. She also caution patient if feeling dehydrated to return to original regimen, continue to monitor weight. Patient weighed today noted with shoes, belt, wallet, and fully clothe and was up 2 pounds and sister states that he knows to weigh with the same daily without clothes.   Patient Self Care Activities:  Attend all scheduled provider appointments Call pharmacy for medication refills 3-7 days in advance of running out of medications Call provider office for new concerns or questions  Notify RN Care Manager of [facility] call rescheduling needs Participate in Transition of Care Program/Attend TOC scheduled calls Take medications as prescribed   01/12/24 Norvel, sister states that [facility] is suppose to start Monday for personal care services. Also, [facility] has come out and done the assessment for PT/OT home health on Monday also.  Plan:  The care management team will reach out to the patient again over the next 5-10 business days. The patient has been provided with contact information for the care management team and has been advised to call with any health related questions or concerns. Appointment made for next week for enrolled in [facility] as patient and sister requested to end call but  made appointment for next week follow up. 01/12/24 Continue to restrict sodium as directed and fluid intake. 01/19/24 Patient and sister caution from HF clinic to monitor for dehydration since medications were adjusted for the overnight weight gain. Sister verbalizes understanding.          Patient verbalizes understanding of instructions and care plan provided today and agrees to view in MyChart. Active MyChart status and patient understanding of how to access instructions and care plan via MyChart confirmed with patient.     Telephone follow up appointment with care management team member scheduled for: The patient has been provided with contact information for the care management team and has been advised to call with any health related questions or concerns.  Follow up with provider re: increased shortness of breath and signs and symptoms of dehydration as well, Patient encouraged to use Albuterol  inhaler due to wheezing. Encouraged deep breathing and coughing.  Please call the care guide team at [phone] if you need to cancel or reschedule your appointment.   Please call the [place]  National Suicide Prevention Lifeline: 3523659347 or TTY: 289-057-2716 TTY 562-092-3705) to talk to a trained counselor call 1-800-273-TALK (toll free, 24 hour hotline) call 911 if you are experiencing a Mental Health or Behavioral Health Crisis or need someone to talk to.  [name], RN, BSN, CCM [facility], [facility] RN Care Manager Direct Dial: [phone]

## 2024-01-22 ENCOUNTER — Telehealth: Payer: Self-pay | Admitting: Family

## 2024-01-22 DIAGNOSIS — I480 Paroxysmal atrial fibrillation: Secondary | ICD-10-CM | POA: Diagnosis not present

## 2024-01-22 DIAGNOSIS — I5042 Chronic combined systolic (congestive) and diastolic (congestive) heart failure: Secondary | ICD-10-CM | POA: Diagnosis not present

## 2024-01-22 DIAGNOSIS — E785 Hyperlipidemia, unspecified: Secondary | ICD-10-CM | POA: Diagnosis not present

## 2024-01-22 DIAGNOSIS — C61 Malignant neoplasm of prostate: Secondary | ICD-10-CM | POA: Diagnosis not present

## 2024-01-22 DIAGNOSIS — E876 Hypokalemia: Secondary | ICD-10-CM | POA: Diagnosis not present

## 2024-01-22 DIAGNOSIS — R7303 Prediabetes: Secondary | ICD-10-CM | POA: Diagnosis not present

## 2024-01-22 DIAGNOSIS — I7121 Aneurysm of the ascending aorta, without rupture: Secondary | ICD-10-CM | POA: Diagnosis not present

## 2024-01-22 DIAGNOSIS — I130 Hypertensive heart and chronic kidney disease with heart failure and stage 1 through stage 4 chronic kidney disease, or unspecified chronic kidney disease: Secondary | ICD-10-CM | POA: Diagnosis not present

## 2024-01-22 DIAGNOSIS — C44629 Squamous cell carcinoma of skin of left upper limb, including shoulder: Secondary | ICD-10-CM | POA: Diagnosis not present

## 2024-01-22 DIAGNOSIS — J42 Unspecified chronic bronchitis: Secondary | ICD-10-CM | POA: Diagnosis not present

## 2024-01-22 DIAGNOSIS — M5416 Radiculopathy, lumbar region: Secondary | ICD-10-CM | POA: Diagnosis not present

## 2024-01-22 DIAGNOSIS — N1831 Chronic kidney disease, stage 3a: Secondary | ICD-10-CM | POA: Diagnosis not present

## 2024-01-22 DIAGNOSIS — F1020 Alcohol dependence, uncomplicated: Secondary | ICD-10-CM | POA: Diagnosis not present

## 2024-01-22 NOTE — Telephone Encounter (Signed)
 Called to confirm/remind patient of their appointment at the [facility] on 01/23/24.   Appointment:   [x] Confirmed  [] Left mess   [] No answer/No voice mail  [] VM Full/unable to leave message  [] Phone not in service  Patient reminded to bring all medications and/or complete list.  Confirmed patient has transportation. Gave directions, instructed to utilize valet parking.

## 2024-01-23 ENCOUNTER — Ambulatory Visit (HOSPITAL_BASED_OUTPATIENT_CLINIC_OR_DEPARTMENT_OTHER): Admitting: Family

## 2024-01-23 ENCOUNTER — Other Ambulatory Visit
Admission: RE | Admit: 2024-01-23 | Discharge: 2024-01-23 | Disposition: A | Discharge status: Home / Self Care | Source: Ambulatory Visit | Attending: Family | Admitting: Family

## 2024-01-23 ENCOUNTER — Encounter: Payer: Self-pay | Admitting: Family

## 2024-01-23 ENCOUNTER — Other Ambulatory Visit: Payer: Self-pay

## 2024-01-23 ENCOUNTER — Telehealth: Payer: Self-pay

## 2024-01-23 VITALS — BP 94/59 | HR 65

## 2024-01-23 DIAGNOSIS — I4510 Unspecified right bundle-branch block: Secondary | ICD-10-CM | POA: Diagnosis not present

## 2024-01-23 DIAGNOSIS — I5022 Chronic systolic (congestive) heart failure: Secondary | ICD-10-CM | POA: Diagnosis not present

## 2024-01-23 DIAGNOSIS — E876 Hypokalemia: Secondary | ICD-10-CM | POA: Diagnosis not present

## 2024-01-23 DIAGNOSIS — J42 Unspecified chronic bronchitis: Secondary | ICD-10-CM | POA: Diagnosis not present

## 2024-01-23 DIAGNOSIS — F17220 Nicotine dependence, chewing tobacco, uncomplicated: Secondary | ICD-10-CM | POA: Insufficient documentation

## 2024-01-23 DIAGNOSIS — I130 Hypertensive heart and chronic kidney disease with heart failure and stage 1 through stage 4 chronic kidney disease, or unspecified chronic kidney disease: Secondary | ICD-10-CM | POA: Diagnosis not present

## 2024-01-23 DIAGNOSIS — I471 Supraventricular tachycardia, unspecified: Secondary | ICD-10-CM | POA: Diagnosis not present

## 2024-01-23 DIAGNOSIS — I5020 Unspecified systolic (congestive) heart failure: Secondary | ICD-10-CM | POA: Insufficient documentation

## 2024-01-23 DIAGNOSIS — M5416 Radiculopathy, lumbar region: Secondary | ICD-10-CM | POA: Diagnosis not present

## 2024-01-23 DIAGNOSIS — I420 Dilated cardiomyopathy: Secondary | ICD-10-CM | POA: Insufficient documentation

## 2024-01-23 DIAGNOSIS — C61 Malignant neoplasm of prostate: Secondary | ICD-10-CM | POA: Diagnosis not present

## 2024-01-23 DIAGNOSIS — F1910 Other psychoactive substance abuse, uncomplicated: Secondary | ICD-10-CM

## 2024-01-23 DIAGNOSIS — N1831 Chronic kidney disease, stage 3a: Secondary | ICD-10-CM | POA: Insufficient documentation

## 2024-01-23 DIAGNOSIS — F1020 Alcohol dependence, uncomplicated: Secondary | ICD-10-CM | POA: Diagnosis not present

## 2024-01-23 DIAGNOSIS — Z8546 Personal history of malignant neoplasm of prostate: Secondary | ICD-10-CM | POA: Insufficient documentation

## 2024-01-23 DIAGNOSIS — I700 Atherosclerosis of aorta: Secondary | ICD-10-CM | POA: Insufficient documentation

## 2024-01-23 DIAGNOSIS — Z7901 Long term (current) use of anticoagulants: Secondary | ICD-10-CM | POA: Diagnosis not present

## 2024-01-23 DIAGNOSIS — G4733 Obstructive sleep apnea (adult) (pediatric): Secondary | ICD-10-CM | POA: Insufficient documentation

## 2024-01-23 DIAGNOSIS — I10 Essential (primary) hypertension: Secondary | ICD-10-CM | POA: Diagnosis not present

## 2024-01-23 DIAGNOSIS — C44629 Squamous cell carcinoma of skin of left upper limb, including shoulder: Secondary | ICD-10-CM | POA: Diagnosis not present

## 2024-01-23 DIAGNOSIS — I480 Paroxysmal atrial fibrillation: Secondary | ICD-10-CM | POA: Diagnosis not present

## 2024-01-23 DIAGNOSIS — E785 Hyperlipidemia, unspecified: Secondary | ICD-10-CM

## 2024-01-23 DIAGNOSIS — I5042 Chronic combined systolic (congestive) and diastolic (congestive) heart failure: Secondary | ICD-10-CM | POA: Diagnosis not present

## 2024-01-23 DIAGNOSIS — R7303 Prediabetes: Secondary | ICD-10-CM | POA: Diagnosis not present

## 2024-01-23 DIAGNOSIS — I7121 Aneurysm of the ascending aorta, without rupture: Secondary | ICD-10-CM | POA: Diagnosis not present

## 2024-01-23 DIAGNOSIS — I4819 Other persistent atrial fibrillation: Secondary | ICD-10-CM | POA: Diagnosis not present

## 2024-01-23 DIAGNOSIS — R0602 Shortness of breath: Secondary | ICD-10-CM | POA: Diagnosis present

## 2024-01-23 LAB — BASIC METABOLIC PANEL WITH GFR
Anion gap: 8 (ref 5–15)
BUN: 31 mg/dL — ABNORMAL HIGH (ref 8–23)
CO2: 29 mmol/L (ref 22–32)
Calcium: 8.9 mg/dL (ref 8.9–10.3)
Chloride: 98 mmol/L (ref 98–111)
Creatinine, Ser: 1.89 mg/dL — ABNORMAL HIGH (ref 0.61–1.24)
GFR, Estimated: 35 mL/min — ABNORMAL LOW (ref >60)
Glucose, Bld: 122 mg/dL — ABNORMAL HIGH (ref 70–99)
Potassium: 4.1 mmol/L (ref 3.5–5.1)
Sodium: 135 mmol/L (ref 135–145)

## 2024-01-23 MED ORDER — AMIODARONE HCL 200 MG PO TABS
200.0000 mg | ORAL_TABLET | Freq: Two times a day (BID) | ORAL | 5 refills | Status: DC
  Filled 2024-01-23 – 2024-02-01 (×3): qty 60, 30d supply, fill #0

## 2024-01-23 MED ORDER — METOPROLOL SUCCINATE ER 100 MG PO TB24
100.0000 mg | ORAL_TABLET | Freq: Every day | ORAL | 5 refills | Status: DC
  Filled 2024-01-23 – 2024-01-31 (×2): qty 30, 30d supply, fill #0

## 2024-01-23 MED ORDER — POTASSIUM CHLORIDE CRYS ER 20 MEQ PO TBCR
40.0000 meq | EXTENDED_RELEASE_TABLET | Freq: Every day | ORAL | 5 refills | Status: AC
  Filled 2024-01-23 – 2024-01-25 (×2): qty 60, 30d supply, fill #0
  Filled 2024-02-23: qty 60, 30d supply, fill #1
  Filled 2024-03-30: qty 60, 30d supply, fill #2
  Filled 2024-04-29: qty 60, 30d supply, fill #3
  Filled 2024-05-02: qty 30, 15d supply, fill #3
  Filled 2024-05-03: qty 30, 15d supply, fill #4
  Filled 2024-05-13: qty 60, 30d supply, fill #4
  Filled 2024-05-15: qty 30, 15d supply, fill #4
  Filled 2024-05-15: qty 60, 30d supply, fill #4

## 2024-01-23 MED ORDER — FUROSEMIDE 40 MG PO TABS
40.0000 mg | ORAL_TABLET | Freq: Every day | ORAL | 5 refills | Status: DC
  Filled 2024-01-23: qty 60, 60d supply, fill #0

## 2024-01-23 MED ORDER — LOSARTAN POTASSIUM 25 MG PO TABS
25.0000 mg | ORAL_TABLET | Freq: Every day | ORAL | 5 refills | Status: DC
  Filled 2024-01-23: qty 30, 30d supply, fill #0

## 2024-01-23 MED ORDER — FUROSEMIDE 40 MG PO TABS
80.0000 mg | ORAL_TABLET | Freq: Every day | ORAL | 5 refills | Status: DC
  Filled 2024-01-23: qty 60, 30d supply, fill #0

## 2024-01-23 MED ORDER — APIXABAN 5 MG PO TABS
5.0000 mg | ORAL_TABLET | Freq: Two times a day (BID) | ORAL | 5 refills | Status: DC
  Filled 2024-01-23: qty 60, 30d supply, fill #0
  Filled 2024-02-23: qty 60, 30d supply, fill #1

## 2024-01-23 NOTE — Telephone Encounter (Signed)
 Copied from CRM #[id number]. Topic: Clinical - Home Health Verbal Orders >> Jan 23, 2024  1:42 PM Winona R wrote: Caller/Agency: Gleysy from [facility] requesting an extension Callback Number: [phone] Service Requested: Occupational Therapy Frequency 1 week 5 as of this week.  Any new concerns about the patient? Yes,  OT assistance that is working with him states he has experience a set back from his cardio version last week, increased shortness of breathe and Fatigue, continuing on working on safety and balance.

## 2024-01-23 NOTE — Progress Notes (Signed)
 Advanced Heart Failure Clinic Note   Referring Physician: PCP: Myrla Jon [name], MD PCP-Cardiologist: Lonni Hanson, MD   Chief Complaint: Shortness of Breath   HPI:  Mr. Cameron Foster is an 83 y/o male with a history of atrial fibrillation, PSVT, NSVT, dialated cardiomyoptathy, HTN, hyperlipidemia, aortic atherosclerosis, RBBB, substance abuse, prostate cancer, sleep apnea, CKD, and chronic HF reduced ejection fraction.   Admitted to [facility] 9/3-9/7 due to chest pain and tachycardia, found to have atrial fibrillation with RVR, rates in the 160s. Cardizem  given and metoprolol  given.   Echo (12/22/23) EF: 55-60%  Admitted to [facility] 9/15 due to chest pain with persistent tachycardia. EKG showed atrial fibrillation. He received IV lasix  and metrprolol. He was Cardioverter on 01/04/24 with symptom resolve.   TEE (01/03/24) EF: 30-35%, LV   Cardioversion 01/17/24 due to symptomatic atrial fibrillation. Procedure successful with 1 shock resulting in NSR.   He presents today for a [facility] follow up visit post cardioversion accompanied by his sister with a chief complaint of a shortness of breath with associated fatigue, edema, cough, and generalized weakness. He continues to sleep in the recliner due to orthopnea but does not wear his CPAP due to uncomfortably of mask. Denies PND, snoring, chest pain, palpitations, dizziness, and abdominal distention. He has started taking 80mg  of lasix  and 40meq of potassium as prescribed with symptoms unchanged.   Patient now receives home care services 4 hours a day x 5 days. Patient has ambulated with staff but reports he overexerted himself worsening shortness of breath, Prior visit, sister discussed episode of confusion and slurred speech. Today reports patient has had no further episodes.   Weight has been monitoring daily with weight loss of 5 lbs after starting lasix , weight now about the same as prior weight. He has stopped drinking as of since last visit but  continues to use dip. Has attempted to use nicotine  gum for cessation. NAS to meals and maintains fluid restriction.   He has onset of right handed jerking that has been ongoing for two weeks. No specific cause of onset identified by patient or sister. No current pain association.   Sister to schedule upcoming appointment with Cardiology (End) and PCP Lollie)  Past Medical History:  Diagnosis Date   Alcoholism [facility])    Allergy    Aortic atherosclerosis    Arthritis    Ascending aorta dilation 03/28/2018   a.) Vascular US : prox asc Ao measured 29 mm. b.)  CT CAP 06/22/2021: Ao root 41 mm. c.) TTE 06/26/2021: Ao root 41 mm; asc Ao 39 mm   Atrial fibrillation (HCC) 1995   a.) single episode in 1995 per patient; no long term treatment. b.) recurrent episode in the setting of GI bleeding related to colitis 06/2021.   Basal cell carcinoma 04/26/2017   Right medial cheek. Superficial and nodular   Basal cell carcinoma 06/11/2019   Left anterior shoulder. Nodular pattern   Basal cell carcinoma 09/09/2019   Right nasal ala, [facility]   Basal cell carcinoma 02/05/2020   L upper eyebrow, [facility]    Basal cell carcinoma 10/12/2022   left post auricular, [facility]   Basal cell carcinoma 05/02/2023   Right medial cheek, [facility]   Bilateral carotid artery stenosis 06/09/2019   a.) Carotid doppler: mod; <50% BILATERAL ICAs.   Cataract    Chicken pox    Colon cancer [facility])    Colon polyp    Depression    Son died 02/09/2015   Diverticulitis  Diverticulosis 30 years   Gastritis    GERD (gastroesophageal reflux disease)    H. pylori infection    History of stress test    a. 09/2015 MV: No ischemia/infarct. EF 45-54% (nl by echo).   Hyperlipidemia    Hypertension    Irritable bowel syndrome    Lacunar infarction (HCC) 06/08/2019   a.) small; RIGHT motor strip   Myocardial infarction [facility])    Neuromuscular disorder (HCC)    NSVT (nonsustained ventricular tachycardia) (HCC)    a.)  Single episode lasting  5 beats at a maximum rate of 160 bpm noted on Holter study performed 08/13/2021.   Prostate cancer (HCC) 2012   a.) s/p XRT   PSVT (paroxysmal supraventricular tachycardia)    a. 06/2019 Zio: Avg rate 74 (54-120), occas PACs, rare PVCs, 125 episodes of PVCs (longest 17.5 secs; max rate 187). No afib.   RBBB    SAH (subarachnoid hemorrhage) (HCC) 10/10/2018   Sleep apnea    Squamous cell carcinoma of skin 07/18/2017   Left medial calf. KA type   Squamous cell carcinoma of skin 04/24/2018   Right above med. brow   Squamous cell carcinoma of skin 06/11/2019   Right posterior shoulder. SCCis, hypertrophic   Squamous cell carcinoma of skin 01/09/2020   Mid nasal dorsum, MOHS, Efudex x 4wks   Stroke [facility])    Substance abuse (HCC)    Systolic dysfunction    a.) TTE 10/05/2015: EF 50-55%; mild LVH; LAE; triv AR, mild MR. b.) TTE 03/29/2018: EF 55-60%; LAE, mild AR; ? small PFO. c.) TTE 06/09/2019: EF 55-60%, no rwma, triv MR/AI. d.) TTE 06/26/2021: EF 45-50%; glob HK; LAE; mild MR; Ao root 41 mm; asc Ao 39 mm.   Vasovagal syncope     Current Outpatient Medications  Medication Sig Dispense Refill   albuterol  (VENTOLIN  HFA) 108 (90 Base) MCG/ACT inhaler Inhale 2 puffs into the lungs every 6 (six) hours as needed for wheezing or shortness of breath. 8 g 0   Alpha-Lipoic Acid 600 MG TABS Take 1 tablet by mouth daily.     amiodarone  (PACERONE ) 200 MG tablet Take 1 tablet (200 mg total) by mouth daily. 120 tablet 5   apixaban  (ELIQUIS ) 5 MG TABS tablet Take 1 tablet (5 mg total) by mouth 2 (two) times daily. 60 tablet 1   atorvastatin  (LIPITOR) 40 MG tablet Take 1 tablet (40 mg total) by mouth daily. 90 tablet 0   cetirizine  (ZYRTEC ) 10 MG tablet Take 10 mg by mouth daily.     Cholecalciferol (VITAMIN D-3) 25 MCG (1000 UT) CAPS Take 1,000 Units by mouth daily.     dextromethorphan -guaiFENesin  (MUCINEX  DM) 30-600 MG 12hr tablet Take 1 tablet by mouth 2 (two) times daily as needed for cough.      docusate sodium  (COLACE) 100 MG capsule Take 1 capsule (100 mg total) by mouth 2 (two) times daily. (Patient taking differently: Take 200 mg by mouth daily.) 30 capsule 1   fluticasone  (FLONASE ) 50 MCG/ACT nasal spray PLACE 1 SPRAY INTO BOTH NOSTRILS DAILY AS NEEDED FOR ALLERGIES OR RHINITIS. 48 mL 1   folic acid  (FOLVITE ) 1 MG tablet Take 1 tablet (1 mg total) by mouth daily. 90 tablet 0   furosemide  (LASIX ) 40 MG tablet Take 1 tablet (40 mg total) by mouth daily. (Patient taking differently: Take 40 mg by mouth daily. Per sister Norvel and note from 01/18/24 Patient was instructed to take (2) two 40 mg tablets until 01/23/24 appointment with  [facility] by Ellouise Class) 30 tablet 5   hydrocortisone  2.5 % cream APPLY TO AFFECTED AREA TWICE A DAY AS NEEDED FOR RASH 28 g 12   ketoconazole  (NIZORAL ) 2 % cream Apply once or twice daily to affected skin folds as needed for rash 60 g 5   losartan  (COZAAR ) 25 MG tablet Take 1 tablet (25 mg total) by mouth daily. 30 tablet 0   metoprolol  succinate (TOPROL -XL) 100 MG 24 hr tablet Take 1 tablet (100 mg total) by mouth daily. Take with or immediately following a meal. 30 tablet 0   Multiple Vitamin (MULTIVITAMIN WITH MINERALS) TABS tablet Take 1 tablet by mouth daily. 30 tablet 0   omeprazole  (PRILOSEC) 20 MG capsule TAKE 1 CAPSULE (20 MG TOTAL) BY MOUTH DAILY. REPORTS CURRENTLY TAKING AS NEEDED (Patient taking differently: Take 20 mg by mouth daily. Per sister and note 01/18/24 patient to take 2- 20 MEQ tablets daily until next ofv 01/23/24 states per Ellouise Class from [facility]) 90 capsule 1   potassium chloride  SA (KLOR-CON  M) 20 MEQ tablet Take 1 tablet (20 mEq total) by mouth daily. 30 tablet 5   pregabalin  (LYRICA ) 25 MG capsule Take 25 mg by mouth 2 (two) times daily.     sertraline  (ZOLOFT ) 100 MG tablet Take 1 tablet (100 mg total) by mouth daily. 90 tablet 1   tadalafil  (CIALIS ) 5 MG tablet Take 1 tablet (5 mg total) by mouth daily as needed for erectile dysfunction.  (Patient not taking: Reported on 01/15/2024) 30 tablet 2   tadalafil  (CIALIS ) 5 MG tablet Take 1 tablet (5 mg total) by mouth every other day as needed. (Patient taking differently: Take 5 mg by mouth every other day.) 30 tablet 5   thiamine  (VITAMIN B1) 100 MG tablet Take 100 mg by mouth daily.     vitamin B-12 (CYANOCOBALAMIN ) 1000 MCG tablet Take 1,000 mcg by mouth daily.     zinc gluconate 50 MG tablet Take 50 mg by mouth daily.     No current facility-administered medications for this visit.    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  Wine, Beer or Liquor.   Drug use: No   Sexual activity: Not Currently    Birth control/protection: None    Comment: Married  Other Topics Concern   Not on file  Social  History Narrative   1 son deceased, 1 daughter living   Social Drivers of Corporate investment banker Strain: Low Risk  (01/02/2024)   Overall Financial Resource Strain (CARDIA)    Difficulty of Paying Living Expenses: Not  very hard  Food Insecurity: No Food Insecurity (01/05/2024)   Hunger Vital Sign    Worried About Running Out of Food in the Last Year: Never true    Ran Out of Food in the Last Year: Never true  Transportation Needs: No Transportation Needs (01/05/2024)   PRAPARE - Administrator, Civil Service (Medical): No    Lack of Transportation (Non-Medical): No  Physical Activity: Inactive (11/24/2023)   Exercise Vital Sign    Days of Exercise per Week: 0 days    Minutes of Exercise per Session: Not on file  Stress: Stress Concern Present (11/24/2023)   [employer] of Occupational Health - Occupational Stress Questionnaire    Feeling of Stress: To some extent  Social Connections: Moderately Integrated (01/01/2024)   Social Connection and Isolation Panel    Frequency of Communication with Friends and Family: Three times a week    Frequency of Social Gatherings with Friends and Family: Three times a week    Attends Religious Services: More than 4 times per year    Active Member of Clubs or Organizations: Yes    Attends Banker Meetings: More than 4 times per year    Marital Status: Widowed  Intimate Partner Violence: Patient Unable To Answer (01/05/2024)   Humiliation, Afraid, Rape, and Kick questionnaire    Fear of Current or Ex-Partner: Patient unable to answer    Emotionally Abused: Patient unable to answer    Physically Abused: Patient unable to answer    Sexually Abused: Patient unable to answer      Family History  Problem Relation Age of Onset   Lung cancer Father        smoker   Cancer Father    Other Mother    Vision loss Mother    Sudden death Son        due to Blood clots   Bipolar disorder Son    Heart disease Son    Early death Son    Learning disabilities Son    Kidney disease Daughter        congenital one small kidney   Varicose Veins Sister    Prostate cancer Neg Hx    Bladder Cancer Neg Hx      There were no vitals filed for this  visit.  Wt Readings from Last 3 Encounters:  01/19/24 98 kg  01/17/24 95.3 kg  01/12/24 95.5 kg    ReDs reading: 35  %, normal   PHYSICAL EXAM:  General: Well appearing but drowsy. No acute signs of distress.  Cor: No JVD. Regular rhythm, rate.  Lungs: Clear lungs bilaterally. Productive cough. Symmetrical chest expansion.  Abdomen: Soft, nontender, nondistended. Extremities: Bilaterally LE edema. Right +3, Left +2.  Neuro:. AO x 3. Affect pleasant.    ECG reviewed: Wide QRS  Personally reviewed.  ASSESSMENT & PLAN: 1. Heart failure with reduced ejection fraction (HCC) (Primary) - NYHA Class III  - Hypervolemic  - ReDs reading today 35%, no pulmonary congestion but lower extremity edema, productive cough, and patient reported symptoms noted. Suspect worsening symptoms due to OSA.   - TEE 01/03/24 EF 50-60%  - Echo 12/22/23 EFL 30-35% - Weight stable from patient recorded diary 01/09/24 until today.  -  Continue to weight daily, maintain intake 2000mg  daily and fluid intake 60-64oz - continue lasix  80mg  daily  - continue potassium 20mg  daily  - continue losartan  25mg  daily  - continue metoprolol  succinate 100mg  daily  - BMET from 01/04/24 reviewed Na+: 136, K+: 4.2, Cr: 1.53, and GFR: 45 - BMET today   2. Persistent atrial fibrillation (HCC) - EKG today showed  - cardioversion 01/17/24 (see above)  - continue eliquis  5mg  BID - continue amiodarone  200mg  BID, do not taper    3. Substance abuse (HCC) - Alcohol and tobacco use cessation encouraged  - Not current use of alcohol  - Cessation of tobacco encouraged  - Continue nicotine  replacement therapy    4. Hyperlipidemia LDL goal <70 - LDL 12/21/23 was 68  - continue atorvastatin  40mg  daily   5. Hypertension - BP 94/59 - Maintain BP <140/90 goal  - continue metoprolol  succinate 100mg  daily  - saw PCP (Bacigalupo) 12/26/23   6. CKD stage 3a, GFR 45-59 ml/min (HCC) - BMET from 01/04/24 reviewed Na+: 136, K+: 4.2, Cr:  1.53, and GFR: 45 - BMET today   7. OSA (obstructive sleep apnea) - Encourage to wear CPAP at night  - Referral for pulmonology placed to reasses CPAP needs     Follow up with Dr. Zenaida.   Ellouise Class, FNP/ Solomon Shizue Kaseman, FNP-S 01/23/24

## 2024-01-23 NOTE — Telephone Encounter (Signed)
 LVMTCB. Ok to advise per provider if call is returned

## 2024-01-24 ENCOUNTER — Ambulatory Visit: Payer: Self-pay | Admitting: Family

## 2024-01-24 ENCOUNTER — Other Ambulatory Visit: Payer: Self-pay | Admitting: Family

## 2024-01-24 NOTE — Telephone Encounter (Signed)
 Called back and advised verbal ok per provider

## 2024-01-25 ENCOUNTER — Ambulatory Visit: Admitting: Family Medicine

## 2024-01-25 ENCOUNTER — Ambulatory Visit: Payer: Self-pay

## 2024-01-25 ENCOUNTER — Other Ambulatory Visit: Payer: Self-pay

## 2024-01-25 DIAGNOSIS — J42 Unspecified chronic bronchitis: Secondary | ICD-10-CM | POA: Diagnosis not present

## 2024-01-25 DIAGNOSIS — I130 Hypertensive heart and chronic kidney disease with heart failure and stage 1 through stage 4 chronic kidney disease, or unspecified chronic kidney disease: Secondary | ICD-10-CM | POA: Diagnosis not present

## 2024-01-25 DIAGNOSIS — I7121 Aneurysm of the ascending aorta, without rupture: Secondary | ICD-10-CM | POA: Diagnosis not present

## 2024-01-25 DIAGNOSIS — C44629 Squamous cell carcinoma of skin of left upper limb, including shoulder: Secondary | ICD-10-CM | POA: Diagnosis not present

## 2024-01-25 DIAGNOSIS — I480 Paroxysmal atrial fibrillation: Secondary | ICD-10-CM | POA: Diagnosis not present

## 2024-01-25 DIAGNOSIS — I5042 Chronic combined systolic (congestive) and diastolic (congestive) heart failure: Secondary | ICD-10-CM | POA: Diagnosis not present

## 2024-01-25 DIAGNOSIS — C61 Malignant neoplasm of prostate: Secondary | ICD-10-CM | POA: Diagnosis not present

## 2024-01-25 DIAGNOSIS — E785 Hyperlipidemia, unspecified: Secondary | ICD-10-CM | POA: Diagnosis not present

## 2024-01-25 DIAGNOSIS — M5416 Radiculopathy, lumbar region: Secondary | ICD-10-CM | POA: Diagnosis not present

## 2024-01-25 DIAGNOSIS — N1831 Chronic kidney disease, stage 3a: Secondary | ICD-10-CM | POA: Diagnosis not present

## 2024-01-25 DIAGNOSIS — E876 Hypokalemia: Secondary | ICD-10-CM | POA: Diagnosis not present

## 2024-01-25 DIAGNOSIS — R7303 Prediabetes: Secondary | ICD-10-CM | POA: Diagnosis not present

## 2024-01-25 DIAGNOSIS — F1020 Alcohol dependence, uncomplicated: Secondary | ICD-10-CM | POA: Diagnosis not present

## 2024-01-25 NOTE — Telephone Encounter (Signed)
 Noted appt scheduled. They also need to let Cardiologist know about symptoms that are occurring after cardioversion.

## 2024-01-25 NOTE — Telephone Encounter (Signed)
 Pt advised. Verbalized understanding.

## 2024-01-25 NOTE — Telephone Encounter (Signed)
 Patient's sister calling for patient today-reports mild shortness of breath that has continued since patient's cardioversion and involuntary muscle jerks to right arm. Sister reports muscle jerks started two weeks ago-happen at random times and don't last. Patient and sister requesting to see PCP. Did offer sooner appointment but sister and patient requested to wait to see PCP on October 16. Appointment made for February 01, 2024 at 1:40 PM. Given instructions to call back if symptoms worsen.   FYI Only or Action Required?: FYI only for provider.  Patient was last seen in primary care on 01/11/2024 by Myrla Jon [name], MD.  Called Nurse Triage reporting Shortness of Breath and Spasms.  Symptoms began several weeks ago.  Interventions attempted: Rest, hydration, or home remedies.  Symptoms are: unchanged.  Triage Disposition: See PCP Within 2 Weeks, See PCP When Office is Open (Within 3 Days)  Patient/caregiver understands and will follow disposition?: Yes Copied from CRM #[id number]. Topic: Clinical - Red Word Triage >> Jan 25, 2024  9:14 AM Turkey B wrote: [facility] that prompted transfer to Nurse Triage: patient has sob and jerking in right arm Reason for Disposition  [1] Muscle jerks or tics without an obvious cause AND [2] brief (seconds, gone now) AND [3] otherwise feels normal (well)  [1] MILD longstanding difficulty breathing (e.g., minimal/no SOB at rest, SOB with walking, pulse < 100) AND [2] SAME as normal  Answer Assessment - Initial Assessment Questions 1. APPEARANCE of MOVEMENT: What did the jerking or twitching look like? (e.g., body area)     Involuntary movements of right arm-jerking 2. ONSET: When did this start happening? (e.g., hours, days, weeks, months ago)     Started 2 weeks ago 3. DURATION: How long does the jerk, twitch, or spasm last?     Randomly will jerk 4. FREQUENCY:  How often does this happen?      randomly 5. WHEN: When does this happen?  (e.g., while awake, while falling asleep, while sleeping)     While awake 6. CAUSE: What do you think caused the muscle jerk?     unsure 7. OTHER SYMPTOMS: Are there any other symptoms? (e.g., fever, headache)     Shortness of breath, headaches  Answer Assessment - Initial Assessment Questions 1. RESPIRATORY STATUS: Describe your breathing? (e.g., wheezing, shortness of breath, unable to speak, severe coughing)      Shortness of breath that has continued since the procedure for a fib 2. ONSET: When did this breathing problem begin?      Been going on for several weeks 3. PATTERN Does the difficult breathing come and go, or has it been constant since it started?      Comes and goes with activity 4. SEVERITY: How bad is your breathing? (e.g., mild, moderate, severe)      Mild-moderate 5. RECURRENT SYMPTOM: Have you had difficulty breathing before? If Yes, ask: When was the last time? and What happened that time?      yes 6. CARDIAC HISTORY: Do you have any history of heart disease? (e.g., heart attack, angina, bypass surgery, angioplasty)      yes 7. LUNG HISTORY: Do you have any history of lung disease?  (e.g., pulmonary embolus, asthma, emphysema)     yes 8. CAUSE: What do you think is causing the breathing problem?      unsure 9. OTHER SYMPTOMS: Do you have any other symptoms? (e.g., chest pain, cough, dizziness, fever, runny nose)     Runny nose 10. O2 SATURATION MONITOR:  Do you use an oxygen saturation monitor (pulse oximeter) at home? If Yes, ask: What is your reading (oxygen level) today? What is your usual oxygen saturation reading? (e.g., 95%)       92-95% 12. TRAVEL: Have you traveled out of the country in the last month? (e.g., travel history, exposures)       no  Protocols used: Muscle Jerks - Tics - Shudders-A-AH, Breathing Difficulty-A-AH

## 2024-01-26 ENCOUNTER — Telehealth: Payer: Self-pay

## 2024-01-26 ENCOUNTER — Other Ambulatory Visit: Payer: Self-pay

## 2024-01-26 ENCOUNTER — Telehealth: Payer: Self-pay | Admitting: Family Medicine

## 2024-01-26 DIAGNOSIS — Z008 Encounter for other general examination: Secondary | ICD-10-CM | POA: Diagnosis not present

## 2024-01-26 DIAGNOSIS — I509 Heart failure, unspecified: Secondary | ICD-10-CM | POA: Diagnosis not present

## 2024-01-26 NOTE — Transitions of Care (Post Inpatient/ED Visit) (Addendum)
 Transition of Care week 4  Visit Note  01/26/2024  Name: Raydan Schlabach. MRN: [id number]          DOB: November 05, 1940  Situation: Patient enrolled in [facility] 30-day program. Visit completed with patient by telephone.   Background:   Initial Transition Care Management Follow-up Telephone Call    Past Medical History:  Diagnosis Date   Alcoholism [facility])    Allergy    Aortic atherosclerosis    Arthritis    Ascending aorta dilation 03/28/2018   a.) Vascular US : prox asc Ao measured 29 mm. b.)  CT CAP 06/22/2021: Ao root 41 mm. c.) TTE 06/26/2021: Ao root 41 mm; asc Ao 39 mm   Atrial fibrillation (HCC) 1995   a.) single episode in 1995 per patient; no long term treatment. b.) recurrent episode in the setting of GI bleeding related to colitis 06/2021.   Basal cell carcinoma 04/26/2017   Right medial cheek. Superficial and nodular   Basal cell carcinoma 06/11/2019   Left anterior shoulder. Nodular pattern   Basal cell carcinoma 09/09/2019   Right nasal ala, EDC   Basal cell carcinoma 02/05/2020   L upper eyebrow, EDC    Basal cell carcinoma 10/12/2022   left post auricular, EDC   Basal cell carcinoma 05/02/2023   Right medial cheek, EDC   Bilateral carotid artery stenosis 06/09/2019   a.) Carotid doppler: mod; <50% BILATERAL ICAs.   Cataract    Chicken pox    Colon cancer (HCC)    Colon polyp    Depression    Son died Feb 15, 2015   Diverticulitis    Diverticulosis 30 years   Gastritis    GERD (gastroesophageal reflux disease)    H. pylori infection    History of stress test    a. 09/2015 MV: No ischemia/infarct. EF 45-54% (nl by echo).   Hyperlipidemia    Hypertension    Irritable bowel syndrome    Lacunar infarction (HCC) 06/08/2019   a.) small; RIGHT motor strip   Myocardial infarction [facility])    Neuromuscular disorder (HCC)    NSVT (nonsustained ventricular tachycardia) (HCC)    a.)  Single episode lasting 5 beats at a maximum rate of 160 bpm noted on Holter study performed  08/13/2021.   Prostate cancer (HCC) 02/15/2011   a.) s/p XRT   PSVT (paroxysmal supraventricular tachycardia)    a. 06/2019 Zio: Avg rate 74 (54-120), occas PACs, rare PVCs, 125 episodes of PVCs (longest 17.5 secs; max rate 187). No afib.   RBBB    SAH (subarachnoid hemorrhage) (HCC) 10/10/2018   Sleep apnea    Squamous cell carcinoma of skin 07/18/2017   Left medial calf. KA type   Squamous cell carcinoma of skin 04/24/2018   Right above med. brow   Squamous cell carcinoma of skin 06/11/2019   Right posterior shoulder. SCCis, hypertrophic   Squamous cell carcinoma of skin 01/09/2020   Mid nasal dorsum, MOHS, Efudex x 4wks   Stroke [facility])    Substance abuse (HCC)    Systolic dysfunction    a.) TTE 10/05/2015: EF 50-55%; mild LVH; LAE; triv AR, mild MR. b.) TTE 03/29/2018: EF 55-60%; LAE, mild AR; ? small PFO. c.) TTE 06/09/2019: EF 55-60%, no rwma, triv MR/AI. d.) TTE 06/26/2021: EF 45-50%; glob HK; LAE; mild MR; Ao root 41 mm; asc Ao 39 mm.   Vasovagal syncope     Assessment:  Patient declined call today stating , I just don't feel up to it and my  sister is not here. This is not one of my good days. Attempted assessment of follow up needs and offered if needed for acute visit.  Patient declines and states, It's just one of those days and I am fixing me something to eat. I think I'll feel better after I eat something. Try and call me back Monday.  Patient assured this Clinical research associate he knows who to contact for any medical follow up needs.  Recommendation:   PCP Follow-up even if after hours triage if having worsening symptoms. Patient states, I really don't feel like I need that, just one of those days. Patient verbalized understanding of follow up plan if needed.  Follow Up Plan:   Telephone follow-up in 1 day business day Monday 01/29/24  [name], RN, BSN, CCM [facility], [facility] RN Care Manager Direct Dial: [phone]

## 2024-01-26 NOTE — Telephone Encounter (Unsigned)
 Copied from CRM [id number]. Topic: Clinical - Medication Refill >> Jan 26, 2024 11:38 AM Deaijah H wrote: Medication: Losartan  25 mg for 100 day supply. (Not on med list)  Has the patient contacted their pharmacy? Yes (Agent: If no, request that the patient contact the pharmacy for the refill. If patient does not wish to contact the pharmacy document the reason why and proceed with request.) (Agent: If yes, when and what did the pharmacy advise?)  This is the patient's preferred pharmacy:  [facility] [address] [place] [address] Phone: [phone] Fax: [phone]  Is this the correct pharmacy for this prescription? Yes If no, delete pharmacy and type the correct one.   Has the prescription been filled recently? Yes  Is the patient out of the medication? No  Has the patient been seen for an appointment in the last year OR does the patient have an upcoming appointment? Yes  Can we respond through MyChart? Yes  Agent: Please be advised that Rx refills may take up to 3 business days. We ask that you follow-up with your pharmacy.

## 2024-01-29 ENCOUNTER — Other Ambulatory Visit: Payer: Self-pay

## 2024-01-29 ENCOUNTER — Other Ambulatory Visit: Payer: Self-pay | Admitting: Family

## 2024-01-29 NOTE — Telephone Encounter (Signed)
 Losartan  was discontinued earlier this month by [facility]

## 2024-01-30 ENCOUNTER — Telehealth: Payer: Self-pay

## 2024-01-30 ENCOUNTER — Other Ambulatory Visit: Payer: Self-pay

## 2024-01-30 ENCOUNTER — Other Ambulatory Visit: Payer: Self-pay | Admitting: Family

## 2024-01-30 NOTE — Patient Instructions (Addendum)
 Visit Information  Thank you for taking time to visit with me today. Please don't hesitate to contact me if I can be of assistance to you before our next scheduled telephone appointment.  Our next appointment is by telephone on Monday 02/05/24  at 11:00 AM  Following is a copy of your care plan:   Goals Addressed             This Visit's Progress    VBCI Transitions of Care (TOC) Care Plan   Improving    Problems:  Recent Hospitalization for treatment of Atrial Fibrillation 01/05/24 Heart Failure Patient needs ongoing assistance for ADLs sister states PCP made a referral for this. Discussed that it may be an out of pocket cost and states they are waiting for a callback from the MDs referral made today 01/05/24 01/19/24 Patient an overnight weight gain and medications were adjusted with Lasix  and Potassium from HF clinic.  Patient to stay on regimen until 01/23/24 appointment unless he starts to feel dehydrated per notes.  01/30/24 Added to schedule today for follow up. Ongoing low energy states, I just want to feel good there's nothing in particular.   Goal:  Over the next 30 days, the patient will not experience hospital readmission  Interventions:   AFIB Interventions:   Advised patient to discuss worsening symptoms with provider Continue to take medications as prescribed and follow up with [facility] regarding managing packing the medication DC instructions reviewed with sister, Norvel on AVS: Call MD for: difficulty breathing, headache or visual disturbances Call MD for: persistant dizziness or light-headedness Call MD for: persistant nausea and vomiting Call MD for: severe uncontrolled pain Call MD for: temperature >100.4 Diet general Discharge instructions Follow up with your primary care physician to discuss the medication changes during this admission. Continue close follow up with cardiology. Use your albuterol  inhaler every 4 hours as needed.  Mucinex  DM for coughing and mucus production. Do NOT use Mucinex  D.  01/19/24  Review HF instructions: Record daily weight on same scale at same time of day. Reviewed last weight at hospital 209.14 oz today 212 lb at home noted. 01/12/24 Patient to continue to monitor weight daily and BP, avoid any additional sodium, read labels, add no salt but try lemon juice, onions, peppers, garlic for added flavor to food. Try low sodium protein shakes, puddings. 01/19/24 -  01/18/24 Sister states she notified the HF office and Ellouise Class, FNP. Medications adjusted until appointment 01/23/24. She also caution patient if feeling dehydrated to return to original regimen, continue to monitor weight. Patient weighed today noted with shoes, belt, wallet, and fully clothe and was up 2 pounds and sister states that he knows to weigh with the same daily without clothes.   Patient Self Care Activities:  Attend all scheduled provider appointments Call pharmacy for medication refills 3-7 days in advance of running out of medications Call provider office for new concerns or questions  Notify RN Care Manager of [facility] call rescheduling needs Participate in Transition of Care Program/Attend TOC scheduled calls Take medications as prescribed   01/12/24 Norvel, sister states that [facility] is suppose to start Monday for personal care services. Also, [facility] has come out and done the assessment for PT/OT home health on Monday also.  Plan:  The care management team will reach out to the patient again over the next 5-10 business days. The patient has been provided with contact information for the care management team and has been advised  to call with any health related questions or concerns. Appointment made for next week for enrolled in [facility] as patient and sister requested to end call but made appointment for next week follow up. 01/12/24 Continue to restrict sodium as directed and fluid intake. 01/19/24 Patient and sister  caution from HF clinic to monitor for dehydration since medications were adjusted for the overnight weight gain. Sister verbalizes understanding.  01/30/24 Continue to weigh and monitor for symptoms. Follow up with PCP as needed - has appointment scheduled for 02/01/24, patient aware.        Patient verbalizes understanding of instructions and care plan provided today and agrees to view in MyChart. Active MyChart status and patient understanding of how to access instructions and care plan via MyChart confirmed with patient.     The patient has been provided with contact information for the care management team and has been advised to call with any health related questions or concerns.  The care management team will reach out to the patient again over the next 5-7 business days.   Please call the care guide team at [phone] if you need to cancel or reschedule your appointment.   Please call the Suicide and Crisis Lifeline: 988 call the [place]  National Suicide Prevention Lifeline: 229-083-8440 or TTY: 901-767-0994 TTY 754-590-2535) to talk to a trained counselor call 1-800-273-TALK (toll free, 24 hour hotline) call 911 if you are experiencing a Mental Health or Behavioral Health Crisis or need someone to talk to.  [name], RN, BSN, CCM [facility], [facility] RN Care Manager Direct Dial: [phone]

## 2024-01-30 NOTE — Transitions of Care (Post Inpatient/ED Visit) (Signed)
 Transition of Care week 4  Visit Note  01/30/2024  Name: Cameron Foster. MRN: [id number]          DOB: [birth year]  Situation: Patient enrolled in [facility] 30-day program. Visit completed with patient by telephone.   Background:   Initial Transition Care Management Follow-up Telephone Call Discharge Date and Diagnosis: 01/04/24, Atrial Fib; Heart Failure   Past Medical History:  Diagnosis Date   Alcoholism (HCC)    Allergy    Aortic atherosclerosis    Arthritis    Ascending aorta dilation 03/28/2018   a.) Vascular US : prox asc Ao measured 29 mm. b.)  CT CAP 06/22/2021: Ao root 41 mm. c.) TTE 06/26/2021: Ao root 41 mm; asc Ao 39 mm   Atrial fibrillation (HCC) 1995   a.) single episode in 1995 per patient; no long term treatment. b.) recurrent episode in the setting of GI bleeding related to colitis 06/2021.   Basal cell carcinoma 04/26/2017   Right medial cheek. Superficial and nodular   Basal cell carcinoma 06/11/2019   Left anterior shoulder. Nodular pattern   Basal cell carcinoma 09/09/2019   Right nasal ala, EDC   Basal cell carcinoma 02/05/2020   L upper eyebrow, EDC    Basal cell carcinoma 10/12/2022   left post auricular, EDC   Basal cell carcinoma 05/02/2023   Right medial cheek, EDC   Bilateral carotid artery stenosis 06/09/2019   a.) Carotid doppler: mod; <50% BILATERAL ICAs.   Cataract    Chicken pox    Colon cancer (HCC)    Colon polyp    Depression    Son died 2015-02-13   Diverticulitis    Diverticulosis 30 years   Gastritis    GERD (gastroesophageal reflux disease)    H. pylori infection    History of stress test    a. 09/2015 MV: No ischemia/infarct. EF 45-54% (nl by echo).   Hyperlipidemia    Hypertension    Irritable bowel syndrome    Lacunar infarction (HCC) 06/08/2019   a.) small; RIGHT motor strip   Myocardial infarction [facility])    Neuromuscular disorder (HCC)    NSVT (nonsustained ventricular tachycardia) (HCC)    a.)  Single episode lasting 5  beats at a maximum rate of 160 bpm noted on Holter study performed 08/13/2021.   Prostate cancer (HCC) 02/13/11   a.) s/p XRT   PSVT (paroxysmal supraventricular tachycardia)    a. 06/2019 Zio: Avg rate 74 (54-120), occas PACs, rare PVCs, 125 episodes of PVCs (longest 17.5 secs; max rate 187). No afib.   RBBB    SAH (subarachnoid hemorrhage) (HCC) 10/10/2018   Sleep apnea    Squamous cell carcinoma of skin 07/18/2017   Left medial calf. KA type   Squamous cell carcinoma of skin 04/24/2018   Right above med. brow   Squamous cell carcinoma of skin 06/11/2019   Right posterior shoulder. SCCis, hypertrophic   Squamous cell carcinoma of skin 01/09/2020   Mid nasal dorsum, MOHS, Efudex x 4wks   Stroke [facility])    Substance abuse (HCC)    Systolic dysfunction    a.) TTE 10/05/2015: EF 50-55%; mild LVH; LAE; triv AR, mild MR. b.) TTE 03/29/2018: EF 55-60%; LAE, mild AR; ? small PFO. c.) TTE 06/09/2019: EF 55-60%, no rwma, triv MR/AI. d.) TTE 06/26/2021: EF 45-50%; glob HK; LAE; mild MR; Ao root 41 mm; asc Ao 39 mm.   Vasovagal syncope     Assessment: Patient Reported Symptoms: Cognitive Cognitive Status: No symptoms  reported, Alert and oriented to person, place, and time, Able to follow simple commands, Normal speech and language skills      Neurological Neurological Review of Symptoms: No symptoms reported Neurological Management Strategies: Medication therapy  HEENT HEENT Symptoms Reported: Nasal discharge (Chronic)      Cardiovascular Cardiovascular Symptoms Reported: Fatigue Does patient have uncontrolled Hypertension?: No Cardiovascular Management Strategies: Activity, Adequate rest, Medication therapy Weight: 212 lb (96.2 kg)  Respiratory   Respiratory Management Strategies: Adequate rest, CPAP, Medication therapy, Routine screening Respiratory Self-Management Outcome: 3 (uncertain) (not wheezing as much)  Endocrine Endocrine Symptoms Reported: No symptoms reported     Gastrointestinal Gastrointestinal Symptoms Reported: No symptoms reported      Genitourinary Genitourinary Symptoms Reported: No symptoms reported Genitourinary Management Strategies: Fluid modification, Diet modification  Integumentary Integumentary Symptoms Reported: No symptoms reported Skin Management Strategies: Medication therapy (has creams as needed, n)  Musculoskeletal Musculoskelatal Symptoms Reviewed: Unsteady gait, Weakness Musculoskeletal Management Strategies: Activity, Adequate rest      Psychosocial Psychosocial Symptoms Reported: No symptoms reported (patient states just wants to get well and be able to get back to doing things)           Medications Reviewed Today     Reviewed by Eilleen Richerd GRADE, RN (Registered Nurse) on 01/30/24 at 1648  Med List Status: <None>   Medication Order Taking? Sig Documenting Provider Last Dose Status Informant  albuterol  (VENTOLIN  HFA) 108 (90 Base) MCG/ACT inhaler [id number] Yes Inhale 2 puffs into the lungs every 6 (six) hours as needed for wheezing or shortness of breath. Levander Slate, MD  Active Family Member           Med Note [place], RICHERD GRADE Debar Dec 26, 2023  2:22 PM) Patient has on hand but not taking until needed  Alpha-Lipoic Acid 600 MG TABS [id number] Yes Take 1 tablet by mouth daily. [provider]  Active Family Member  amiodarone  (PACERONE ) 200 MG tablet [id number] Yes Take 1 tablet (200 mg total) by mouth 2 (two) times daily. [place] A, FNP  Active   apixaban  (ELIQUIS ) 5 MG TABS tablet [id number] Yes Take 1 tablet (5 mg total) by mouth 2 (two) times daily. [place] A, FNP  Active   atorvastatin  (LIPITOR) 40 MG tablet [id number] Yes Take 1 tablet (40 mg total) by mouth daily. Myrla Jon [name], MD  Active Family Member  cetirizine  (ZYRTEC ) 10 MG tablet [id number] Yes Take 10 mg by mouth daily. [provider]  Active Family Member  Cholecalciferol (VITAMIN D-3) 25 MCG (1000 UT) CAPS  [id number] Yes Take 1,000 Units by mouth daily. [provider]  Active Family Member  dextromethorphan -guaiFENesin  (MUCINEX  DM) 30-600 MG 12hr tablet [id number]  Take 1 tablet by mouth 2 (two) times daily as needed for cough. [provider]  Active Family Member  docusate sodium  (COLACE) 100 MG capsule [id number]  Take 1 capsule (100 mg total) by mouth 2 (two) times daily.  Patient taking differently: Take 200 mg by mouth daily.   Joshua Lin, PA-C  Active Family Member           Med Note [place], [facility]   Thu Dec 21, 2023  7:30 AM)    fluticasone  (FLONASE ) 50 MCG/ACT nasal spray [id number]  PLACE 1 SPRAY INTO BOTH NOSTRILS DAILY AS NEEDED FOR ALLERGIES OR RHINITIS. Myrla Jon [name], MD  Active Family Member  folic acid  (FOLVITE ) 1 MG tablet [id number]  Take 1 tablet (1 mg total) by mouth  daily. Amin, Sumayya, MD  Active Family Member  furosemide  (LASIX ) 40 MG tablet [id number]  Take 2 tablets (80 mg total) by mouth daily. [place] A, [place]  Active   hydrocortisone  2.5 % cream [id number]  APPLY TO AFFECTED AREA TWICE A DAY AS NEEDED FOR RASH Bacigalupo, Jon [name], MD  Active Family Member  ketoconazole  (NIZORAL ) 2 % cream [id number]  Apply once or twice daily to affected skin folds as needed for rash Jackquline Sawyer, MD  Active Family Member  metoprolol  succinate (TOPROL -XL) 100 MG 24 hr tablet [id number]  Take 1 tablet (100 mg total) by mouth daily. Take with or immediately following a meal. [place] A, FNP  Active   Multiple Vitamin (MULTIVITAMIN WITH MINERALS) TABS tablet [id number]  Take 1 tablet by mouth daily. Vachhani, Vaibhavkumar, MD  Active Family Member  omeprazole  (PRILOSEC) 20 MG capsule [id number]  TAKE 1 CAPSULE (20 MG TOTAL) BY MOUTH DAILY. REPORTS CURRENTLY TAKING AS NEEDED  Patient taking differently: Take 20 mg by mouth daily. Per sister and note 01/18/24 patient to take 2- 20 MEQ tablets daily until next ofv 01/23/24 states per [place] from [facility]    Bacigalupo, Jon [name], MD  Active Family Member  potassium chloride  SA (KLOR-CON  M) 20 MEQ tablet [id number]  Take 2 tablets (40 mEq total) by mouth daily. [place] A, FNP  Active   pregabalin  (LYRICA ) 25 MG capsule [id number]  Take 25 mg by mouth 2 (two) times daily.  Patient not taking: Reported on 01/23/2024   [provider]  Active Family Member  sertraline  (ZOLOFT ) 100 MG tablet [id number]  Take 1 tablet (100 mg total) by mouth daily. Myrla Jon [name], MD  Active Family Member  tadalafil  (CIALIS ) 5 MG tablet [id number]  Take 1 tablet (5 mg total) by mouth daily as needed for erectile dysfunction. Myrla Jon [name], MD  Active Family Member           Med Note SIMMIE, RAVEN M   Fri Jul 07, 2023  4:09 PM) Aware to take EVERY OTHER DAY  tadalafil  (CIALIS ) 5 MG tablet [id number]  Take 1 tablet (5 mg total) by mouth every other day as needed.  Patient taking differently: Take 5 mg by mouth every other day.   Donzella Lauraine SAILOR, DO  Active Family Member  thiamine  (VITAMIN B1) 100 MG tablet [id number]  Take 100 mg by mouth daily. [provider]  Active Family Member  vitamin B-12 (CYANOCOBALAMIN ) 1000 MCG tablet [id number]  Take 1,000 mcg by mouth daily. [provider]  Active Family Member  zinc gluconate 50 MG tablet [id number]  Take 50 mg by mouth daily. [provider]  Active Family Member            Recommendation:   Continue Current Plan of Care  Follow Up Plan:   Telephone follow up appointment date/time:  02/05/24   [name], RN, BSN, CCM [place]  [facility], [facility] RN Care Manager Direct Dial: [phone]

## 2024-01-31 ENCOUNTER — Other Ambulatory Visit: Payer: Self-pay

## 2024-01-31 ENCOUNTER — Other Ambulatory Visit (HOSPITAL_COMMUNITY): Payer: Self-pay

## 2024-01-31 MED FILL — Losartan Potassium Tab 25 MG: ORAL | 30 days supply | Qty: 30 | Fill #0 | Status: CN

## 2024-02-01 ENCOUNTER — Other Ambulatory Visit: Payer: Self-pay

## 2024-02-01 ENCOUNTER — Encounter: Payer: Self-pay | Admitting: Family Medicine

## 2024-02-01 ENCOUNTER — Ambulatory Visit (INDEPENDENT_AMBULATORY_CARE_PROVIDER_SITE_OTHER): Admitting: Family Medicine

## 2024-02-01 VITALS — BP 111/76 | HR 67 | Ht 69.0 in | Wt 214.0 lb

## 2024-02-01 DIAGNOSIS — R739 Hyperglycemia, unspecified: Secondary | ICD-10-CM | POA: Diagnosis not present

## 2024-02-01 DIAGNOSIS — R946 Abnormal results of thyroid function studies: Secondary | ICD-10-CM | POA: Diagnosis not present

## 2024-02-01 DIAGNOSIS — N182 Chronic kidney disease, stage 2 (mild): Secondary | ICD-10-CM | POA: Insufficient documentation

## 2024-02-01 DIAGNOSIS — I509 Heart failure, unspecified: Secondary | ICD-10-CM | POA: Diagnosis not present

## 2024-02-01 DIAGNOSIS — Z008 Encounter for other general examination: Secondary | ICD-10-CM | POA: Diagnosis not present

## 2024-02-01 DIAGNOSIS — I10 Essential (primary) hypertension: Secondary | ICD-10-CM | POA: Diagnosis not present

## 2024-02-01 DIAGNOSIS — G253 Myoclonus: Secondary | ICD-10-CM | POA: Diagnosis not present

## 2024-02-01 NOTE — Progress Notes (Signed)
 Acute visit   Patient: Cameron Foster.   DOB: 1941/01/23   [name]  MRN: [id number] PCP: Myrla Jon [name], MD   Chief Complaint  Patient presents with   Acute Visit    Patient is present due to muscle jerk & shortness of breath/E2C2 Triage on 01/25/24. Patient's sister called for patient and reported mild shortness of breath that had continued since patient's cardioversion and involuntary muscle jerks to right arm. Sister reported muscle jerks started two weeks ago and they happen at random times and don't last. She also reported his SOB was continuing since his Afob procedure and had been present for several weeks. Reports home o2 of 92-95%. Mainly around 91-92%   Subjective    Discussed the use of AI scribe software for clinical note transcription with the patient, who gave verbal consent to proceed.  History of Present Illness   Cameron Foster. [name] is an 83 year old male with atrial fibrillation and heart failure who presents with muscle jerking and shortness of breath.  He experiences new muscle jerking and twitching since undergoing cardioversion and starting amiodarone . Initially, he took 400 mg twice daily for over two weeks, now reduced to 200 mg twice daily. The spasms are involuntary, affecting both arms, and have recently started in the left arm. These movements interfere with daily activities, such as spilling water .  He has increased shortness of breath since the cardioversion. It has improved slightly with the cessation of protein drinks at night but remains problematic during longer walks. He uses a CPAP machine for sleep apnea, wearing it for about six to seven hours a night.  He follows a low-sodium diet, aiming for 1500 mg of salt per day, and limits fluid intake to 64 ounces daily. He has experienced fluctuations in fluid retention, with edema in the thighs, buttocks, and lower legs.  He is concerned about his blood sugar levels, which have been  elevated at times, though not consistently in the diabetic range. His son was diagnosed with type 1 diabetes at 32 months old.        Review of Systems  Objective    BP 111/76 (BP Location: Left Arm, Patient Position: Sitting, Cuff Size: Normal)   Pulse 67   Ht 5' 9 (1.753 m)   Wt 214 lb (97.1 kg)   SpO2 96%   BMI 31.60 kg/m  Physical Exam Vitals reviewed.  Constitutional:      General: He is not in acute distress.    Appearance: Normal appearance. He is not diaphoretic.  HENT:     Head: Normocephalic and atraumatic.  Eyes:     General: No scleral icterus.    Conjunctiva/sclera: Conjunctivae normal.  Cardiovascular:     Rate and Rhythm: Normal rate and regular rhythm.     Heart sounds: Normal heart sounds.  Pulmonary:     Effort: Pulmonary effort is normal. No respiratory distress.     Breath sounds: Normal breath sounds. No wheezing or rhonchi.  Musculoskeletal:     Cervical back: Neck supple.     Right lower leg: Edema present.     Left lower leg: Edema present.  Lymphadenopathy:     Cervical: No cervical adenopathy.  Skin:    General: Skin is warm and dry.     Findings: No rash.  Neurological:     General: No focal deficit present.     Mental Status: He is alert and oriented to person,  place, and time. Mental status is at baseline.     Cranial Nerves: No cranial nerve deficit.     Sensory: No sensory deficit.     Motor: No weakness.     Coordination: Coordination normal.  Psychiatric:        Mood and Affect: Mood normal.        Behavior: Behavior normal.       No results found for any visits on 02/01/24.  Assessment & Plan     Problem List Items Addressed This Visit       Cardiovascular and Mediastinum   Essential hypertension   Relevant Orders   Renal Function Panel     Genitourinary   CKD (chronic kidney disease) stage 2, GFR 60-89 ml/min   Other Visit Diagnoses       Myoclonic jerking    -  Primary   Relevant Orders   Amiodarone   ([facility])   TSH   Renal Function Panel     Hyperglycemia       Relevant Orders   Hemoglobin A1c           Myoclonus and tremor likely amiodarone -induced New onset of muscle jerking and twitching, likely related to amiodarone  use. Symptoms include involuntary muscle spasms and tremors, particularly in the arms, with some involvement of the legs. Symptoms began after starting amiodarone , which is known to cause rare neurotoxic side effects such as myoclonic jerks. Differential diagnosis includes electrolyte imbalances or other neurological conditions. - Order amiodarone  level - Perform electrolyte panel to check calcium  and potassium levels - Consider referral to neurology if labs are normal and cardiology rules out amiodarone  as the cause  Heart failure with lower extremity edema Chronic heart failure with persistent lower extremity edema. Fluid retention noted in thighs, buttocks, and lower legs. Shortness of breath has improved slightly, possibly due to reduced fluid intake and cessation of protein drinks. Sleep apnea may contribute to symptoms. Ejection fraction improved from 30-35% to 50-60% over a week and a half, indicating good cardiac improvement. - Continue monitoring fluid intake and salt restriction - Encourage use of CPAP for sleep apnea - Monitor for changes in edema and shortness of breath  Atrial fibrillation, status post cardioversion Status post cardioversion for atrial fibrillation. Cardioversion and subsequent treatment with amiodarone  have been effective in controlling heart rhythm. - Continue current cardiac management - Follow up with cardiology for ongoing management  Chronic kidney disease stage 2 with recent acute kidney injury Chronic kidney disease stage 2 with recent acute kidney injury. Kidney function has shown some decline, possibly related to medication adjustments and fluid management. Losartan  was discontinued due to rising creatinine levels. - Monitor  kidney function closely - Ensure adequate hydration without overloading fluid intake  Obstructive sleep apnea on CPAP Obstructive sleep apnea managed with CPAP. Compliance with CPAP is moderate, with usage of 6-7 hours per night. Sleep apnea may contribute to fluid retention and shortness of breath. - Encourage consistent use of CPAP  Prediabetes Prediabetes with recent blood sugar levels showing some elevation. Previous A1c was at the upper end of normal. Concerns about blood sugar levels due to family history of diabetes. - Order A1c to assess current glycemic control       No orders of the defined types were placed in this encounter.    Return for as scheduled.      Jon Eva, MD  [facility] [phone] (phone) [phone] (fax)  [facility]

## 2024-02-02 ENCOUNTER — Ambulatory Visit: Payer: Self-pay | Admitting: Family Medicine

## 2024-02-02 ENCOUNTER — Ambulatory Visit: Attending: Student | Admitting: Physician Assistant

## 2024-02-02 ENCOUNTER — Other Ambulatory Visit: Payer: Self-pay

## 2024-02-02 ENCOUNTER — Encounter: Payer: Self-pay | Admitting: Student

## 2024-02-02 VITALS — BP 96/66 | HR 64 | Wt 215.0 lb

## 2024-02-02 DIAGNOSIS — Z720 Tobacco use: Secondary | ICD-10-CM | POA: Diagnosis not present

## 2024-02-02 DIAGNOSIS — Z79899 Other long term (current) drug therapy: Secondary | ICD-10-CM

## 2024-02-02 DIAGNOSIS — I4819 Other persistent atrial fibrillation: Secondary | ICD-10-CM | POA: Diagnosis not present

## 2024-02-02 DIAGNOSIS — N1831 Chronic kidney disease, stage 3a: Secondary | ICD-10-CM

## 2024-02-02 DIAGNOSIS — G4733 Obstructive sleep apnea (adult) (pediatric): Secondary | ICD-10-CM | POA: Diagnosis not present

## 2024-02-02 DIAGNOSIS — F1090 Alcohol use, unspecified, uncomplicated: Secondary | ICD-10-CM | POA: Diagnosis not present

## 2024-02-02 DIAGNOSIS — E785 Hyperlipidemia, unspecified: Secondary | ICD-10-CM

## 2024-02-02 DIAGNOSIS — I5023 Acute on chronic systolic (congestive) heart failure: Secondary | ICD-10-CM

## 2024-02-02 DIAGNOSIS — I4892 Unspecified atrial flutter: Secondary | ICD-10-CM | POA: Diagnosis not present

## 2024-02-02 MED ORDER — TORSEMIDE 20 MG PO TABS
40.0000 mg | ORAL_TABLET | Freq: Every day | ORAL | 3 refills | Status: DC
  Filled 2024-02-02: qty 90, 45d supply, fill #0

## 2024-02-02 NOTE — Progress Notes (Signed)
 Cardiology Office Note    Date:  02/02/2024   ID:  Cameron Lard., DOB 11-Aug-1940, MRN [id number]  PCP:  Cameron Jon [name], MD  Cardiologist:  Lonni Hanson, MD  Electrophysiologist:  None   Chief Complaint: Hospital follow-up  History of Present Illness:   Cameron Antonini. is a 83 y.o. male with history of chronic HFmrEF, paroxysmal atrial fibrillation, hypertension, hyperlipidemia, OSA, CVA, subarachnoid hemorrhage, CKD stage IIIa, tobacco use, colitis with GI bleeding, prostate cancer, and alcohol use who presents for hospital follow-up.    Patient was admitted to the hospital 06/2021 with left-sided colitis thought to be ischemic versus infectious.  Flexible sigmoidoscopy showed possible ischemic colitis and he was treated with antibiotics.  This admission was complicated by hypotension and atrial fibrillation with RVR.  Troponin minimally elevated.  BNP 569.  Echo at that time demonstrated EF 45 to 50%, global hypokinesis, moderate concentric LVH, elevated LVEDP, mild LAE, mild MR, and mildly dilated aortic root and ascending aorta measuring 41 and 39 mm respectively.  He was started on IV amiodarone  for rate control which led to pharmacological cardioversion.  He was also started on Eliquis  at that time.  He was seen in the office 07/2021 for hospital follow-up and denied any further GI bleeding.  He expressed concerns for continuing anticoagulation secondary to bleeding problems.  He underwent outpatient cardiac monitoring which showed no evidence of atrial fibrillation and Eliquis  was discontinued.   He was hospitalized from 9/3 to 12/24/2023 with A-fib with RVR requiring Cardizem  infusion. Echo at that time showed LVEF  55-60% without RWMA, mild LVH, diastolic parameters were indeterminate. He was discharged on Eliquis  with plans to perform cardioversion in 3 weeks if persistent A-fib. His stay was complicated by bradycardia and sinus pauses for which initial Cardizem  dose of  240 was decreased to 120 in addition to metoprolol  tartrate 75 mg twice daily.   Patient presented to ED again 01/01/2024 with dull chest pain.  He was found to be in atrial fibrillation with RVR, rate up to 130s bpm.  He underwent TEE guided cardioversion 9/17 with successful conversion to sinus rhythm.  He was started on oral amiodarone  load.  TEE showed EF 30 to 35% with global hypokinesis.  GDMT was initiated with Toprol -XL and losartan .  Patient was discharged 01/04/2024 in stable condition.  He had a follow-up with advanced heart failure clinic in [facility] 01/09/2024 and reported feeling lousy with shortness of breath on minimal exertion.  He reported an episode of about 30 minutes with drowsiness and slurred speech.  EKG showed that he was back in atrial fibrillation.  Weight was up 4 pounds since hospital admission 4 days prior.  He was given Furoscix  1 dose and started on Lasix  40 mg daily.  He was scheduled for DCCV which was completed 10/1 with successful conversion to sinus rhythm.  Lasix  was increased to 80 mg daily.  He was seen again in follow-up with the advanced heart failure clinic in [facility] 10/7 and reported ongoing dyspnea with associated fatigue, edema, cough, and generalized weakness. He was maintaining sinus rhythm per EKG.  Weight was stable.  He was referred to pulmonology for OSA due to difficulty with CPAP.  Follow-up labs with creatinine noted to be elevated.  Losartan  was subsequently discontinued.  Patient presents to clinic today accompanied by his sister who is a retired Engineer, civil (consulting). He has ongoing exertional dyspnea which is somewhat improved since his most recent cardioversion, although he still requires  assistance with ADLs due to dypnea. He endorses ongoing lower extremity swelling, abdominal fullness, and orthopnea. He believes his dry weight is near 205 lbs. He does not feel that furosemide  causes him to urinate any more than if he was not taking it. He has started wearing  his CPAP again with some improvement in sleep quality. He denies chest pain. No bleeding or hematochezia.   Labs independently reviewed: 03/07/24-A1c 6.0, BUN 23, creatinine 1.83, sodium 136, potassium 4.5, TSH 5.460 12/2023-Hgb 10.2, HCT 32.1, platelets 205, mag 2.2, TC 154, TG 167, HDL 53, LDL 68  Objective   Past Medical History:  Diagnosis Date   Alcoholism (HCC)    Allergy    Aortic atherosclerosis    Arthritis    Ascending aorta dilation 03/28/2018   a.) Vascular US : prox asc Ao measured 29 mm. b.)  CT CAP 06/22/2021: Ao root 41 mm. c.) TTE 06/26/2021: Ao root 41 mm; asc Ao 39 mm   Atrial fibrillation (HCC) 1995   a.) single episode in 1995 per patient; no long term treatment. b.) recurrent episode in the setting of GI bleeding related to colitis 06/2021.   Basal cell carcinoma 04/26/2017   Right medial cheek. Superficial and nodular   Basal cell carcinoma 06/11/2019   Left anterior shoulder. Nodular pattern   Basal cell carcinoma 09/09/2019   Right nasal ala, [facility]   Basal cell carcinoma 02/05/2020   L upper eyebrow, [facility]    Basal cell carcinoma 10/12/2022   left post auricular, [facility]   Basal cell carcinoma 05/02/2023   Right medial cheek, [facility]   Bilateral carotid artery stenosis 06/09/2019   a.) Carotid doppler: mod; <50% BILATERAL ICAs.   Cataract    Chicken pox    Colon cancer (HCC)    Colon polyp    Depression    Son died 2015-03-08   Diverticulitis    Diverticulosis 30 years   Gastritis    GERD (gastroesophageal reflux disease)    H. pylori infection    History of stress test    a. 09/2015 MV: No ischemia/infarct. EF 45-54% (nl by echo).   Hyperlipidemia    Hypertension    Irritable bowel syndrome    Lacunar infarction (HCC) 06/08/2019   a.) small; RIGHT motor strip   Myocardial infarction [facility])    Neuromuscular disorder (HCC)    NSVT (nonsustained ventricular tachycardia) (HCC)    a.)  Single episode lasting 5 beats at a maximum rate of 160 bpm noted on Holter study  performed 08/13/2021.   Prostate cancer (HCC) 08-Mar-2011   a.) s/p XRT   PSVT (paroxysmal supraventricular tachycardia)    a. 06/2019 Zio: Avg rate 74 (54-120), occas PACs, rare PVCs, 125 episodes of PVCs (longest 17.5 secs; max rate 187). No afib.   RBBB    SAH (subarachnoid hemorrhage) (HCC) 10/10/2018   Sleep apnea    Squamous cell carcinoma of skin 07/18/2017   Left medial calf. KA type   Squamous cell carcinoma of skin 04/24/2018   Right above med. brow   Squamous cell carcinoma of skin 06/11/2019   Right posterior shoulder. SCCis, hypertrophic   Squamous cell carcinoma of skin 01/09/2020   Mid nasal dorsum, MOHS, Efudex x 4wks   Stroke [facility])    Substance abuse (HCC)    Systolic dysfunction    a.) TTE 10/05/2015: EF 50-55%; mild LVH; LAE; triv AR, mild MR. b.) TTE 03/29/2018: EF 55-60%; LAE, mild AR; ? small PFO. c.) TTE 06/09/2019: EF 55-60%, no rwma,  triv MR/AI. d.) TTE 06/26/2021: EF 45-50%; glob HK; LAE; mild MR; Ao root 41 mm; asc Ao 39 mm.   Vasovagal syncope     Current Medications: Current Meds  Medication Sig   albuterol  (VENTOLIN  HFA) 108 (90 Base) MCG/ACT inhaler Inhale 2 puffs into the lungs every 6 (six) hours as needed for wheezing or shortness of breath.   Alpha-Lipoic Acid 600 MG TABS Take 1 tablet by mouth daily.   amiodarone  (PACERONE ) 200 MG tablet Take 1 tablet (200 mg total) by mouth 2 (two) times daily.   apixaban  (ELIQUIS ) 5 MG TABS tablet Take 1 tablet (5 mg total) by mouth 2 (two) times daily.   atorvastatin  (LIPITOR) 40 MG tablet Take 1 tablet (40 mg total) by mouth daily.   cetirizine  (ZYRTEC ) 10 MG tablet Take 10 mg by mouth daily.   Cholecalciferol (VITAMIN D-3) 25 MCG (1000 UT) CAPS Take 1,000 Units by mouth daily.   dextromethorphan -guaiFENesin  (MUCINEX  DM) 30-600 MG 12hr tablet Take 1 tablet by mouth 2 (two) times daily as needed for cough.   docusate sodium  (COLACE) 100 MG capsule Take 1 capsule (100 mg total) by mouth 2 (two) times daily. (Patient  taking differently: Take 200 mg by mouth daily.)   fluticasone  (FLONASE ) 50 MCG/ACT nasal spray PLACE 1 SPRAY INTO BOTH NOSTRILS DAILY AS NEEDED FOR ALLERGIES OR RHINITIS.   folic acid  (FOLVITE ) 1 MG tablet Take 1 tablet (1 mg total) by mouth daily.   hydrocortisone  2.5 % cream APPLY TO AFFECTED AREA TWICE A DAY AS NEEDED FOR RASH   ketoconazole  (NIZORAL ) 2 % cream Apply once or twice daily to affected skin folds as needed for rash   metoprolol  succinate (TOPROL -XL) 100 MG 24 hr tablet Take 1 tablet (100 mg total) by mouth daily. Take with or immediately following a meal.   metoprolol  tartrate (LOPRESSOR ) 25 MG tablet    Multiple Vitamin (MULTIVITAMIN WITH MINERALS) TABS tablet Take 1 tablet by mouth daily.   omeprazole  (PRILOSEC) 20 MG capsule TAKE 1 CAPSULE (20 MG TOTAL) BY MOUTH DAILY. REPORTS CURRENTLY TAKING AS NEEDED (Patient taking differently: Take 20 mg by mouth daily. Per sister and note 01/18/24 patient to take 2- 20 MEQ tablets daily until next ofv 01/23/24 states per [place] [facility] from [facility])   potassium chloride  SA (KLOR-CON  M) 20 MEQ tablet Take 2 tablets (40 mEq total) by mouth daily.   sertraline  (ZOLOFT ) 100 MG tablet Take 1 tablet (100 mg total) by mouth daily.   tadalafil  (CIALIS ) 5 MG tablet Take 1 tablet (5 mg total) by mouth daily as needed for erectile dysfunction.   tadalafil  (CIALIS ) 5 MG tablet Take 1 tablet (5 mg total) by mouth every other day as needed. (Patient taking differently: Take 5 mg by mouth every other day.)   thiamine  (VITAMIN B1) 100 MG tablet Take 100 mg by mouth daily.   torsemide (DEMADEX) 20 MG tablet Take 2 tablets (40 mg total) by mouth daily.   vitamin B-12 (CYANOCOBALAMIN ) 1000 MCG tablet Take 1,000 mcg by mouth daily.   zinc gluconate 50 MG tablet Take 50 mg by mouth daily.   [DISCONTINUED] furosemide  (LASIX ) 40 MG tablet Take 2 tablets (80 mg total) by mouth daily.    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  Wine, Beer or Liquor.   Drug use: No   Sexual activity: Not Currently    Birth control/protection: None    Comment: Married  Other Topics Concern   Not on file  Social History Narrative   1 son deceased, 1 daughter living   Social Drivers of Corporate investment banker Strain: Low Risk  (01/28/2024)   Overall Financial Resource Strain (CARDIA)    Difficulty of Paying Living Expenses: Not very hard  Food Insecurity: No Food Insecurity (01/28/2024)   Hunger Vital Sign    Worried About Running Out of Food in the Last Year: Never true    Ran Out of Food in the Last Year: Never true  Transportation Needs: No Transportation Needs (01/28/2024)   PRAPARE - Administrator, Civil Service (Medical): No    Lack of Transportation (Non-Medical): No  Physical Activity: Inactive (01/28/2024)   Exercise Vital Sign    Days of Exercise per Week: 0 days    Minutes of Exercise per Session: Not on file  Stress: No Stress Concern Present (01/28/2024)   [employer] of Occupational Health - Occupational Stress Questionnaire    Feeling of Stress: Only a little  Recent Concern: Stress - Stress Concern  Present (11/24/2023)   [employer] of Occupational Health - Occupational Stress Questionnaire    Feeling of Stress: To some extent  Social Connections: Unknown (01/28/2024)   Social Connection and Isolation Panel    Frequency of Communication with Friends and Family: Three times a week    Frequency of Social Gatherings with Friends and Family: Twice a week    Attends Religious Services: More than 4 times per year    Active Member of [employer] or Organizations: Not on file    Attends Banker Meetings: Not on file    Marital Status: Widowed     Family History:  The patient's family history includes Bipolar disorder in his son; Cancer in his father; Early death in his son; Heart disease in his son; Kidney disease in his daughter; Learning disabilities in his son; Lung cancer in his father; Other in his mother; Sudden death in his son; Varicose Veins in his sister; Vision loss in his mother. There is no history of Prostate cancer or Bladder Cancer.  ROS:   12-point review of systems is negative unless otherwise noted in the HPI.  EKGs/Other Studies Reviewed:    Studies reviewed were summarized above. The additional studies were reviewed today:  01/03/2024 TEE 1. Left ventricular ejection fraction, by estimation, is 30 to 35%. The  left ventricle has moderately decreased function. The left ventricle  demonstrates global hypokinesis.   2. Right ventricular systolic function is normal. The right ventricular  size is normal.   3. Left atrial size was severely dilated. No left atrial/left atrial  appendage thrombus was detected.   4. The mitral valve is normal in structure. Mild mitral valve  regurgitation. No evidence of mitral stenosis.   5. The aortic valve is tricuspid. Aortic valve regurgitation is not  visualized. No aortic stenosis is present.   6. There is mild (Grade II) atheroma plaque involving the aortic arch and  descending aorta.   7. The inferior vena cava is  normal in size with greater than 50%  respiratory variability, suggesting right atrial pressure of 3 mmHg.   8. Agitated saline contrast bubble study was positive with shunting  observed within 3-6 cardiac cycles suggestive of interatrial shunt. There  is a small patent foramen ovale with predominantly left to right shunting  across the atrial septum.   12/22/2023 Echo complete 1. Left ventricular ejection fraction, by estimation, is 55 to 60%. Left  ventricular ejection fraction by PLAX is 66 %. The left ventricle has  normal function. The left ventricle has no regional wall motion  abnormalities. There is mild left ventricular  hypertrophy. Left ventricular diastolic parameters are indeterminate.   2. Right ventricular systolic function is normal. The right ventricular  size is normal.   3. The mitral valve is normal in structure. No evidence of mitral valve  regurgitation.   4. The aortic valve was not well visualized. Aortic valve regurgitation  is not visualized.   EKG:  EKG personally reviewed by me today    PHYSICAL EXAM:    VS:  BP 96/66 (BP Location: Right Arm, Patient Position: Sitting, Cuff Size: Large)   Pulse 64   Wt 215 lb (97.5 kg)   BMI 31.75 kg/m   BMI: Body mass index is 31.75 kg/m.  GEN: Well nourished, well developed in no acute distress NECK: No JVD; No carotid bruits CARDIAC: RRR, I/VI systolic murmur, no rubs or gallops RESPIRATORY:  Clear to auscultation without rales, wheezing or rhonchi  ABDOMEN: Non-tender, mildly distended EXTREMITIES: 2+ LE edema up to the knees bilaterally; No deformity  Wt Readings from Last 3 Encounters:  02/02/24 215 lb (97.5 kg)  02/01/24 214 lb (97.1 kg)  01/30/24 212 lb (96.2 kg)                  ASSESSMENT & PLAN:   Persistent atrial fibrillation/flutter - Two recent hospitalizations in September for atrial fibrillation/flutter with RVR. S/p successful TEE/DCCV on 9/17. TEE showed reduced EF 30-35%. Found to be  back in atrial fibrillation on follow up. Subsequent DCCV 10/1. Maintaining sinus rhythm today. He is continued on amiodarone  200 mg daily, metoprolol  succinate 100 mg daily, and Eliquis  5 mg twice daily. CHA2DS2VASc 7. He has a history of GI bleed. Denies current bleeding and hematochezia. Recommend referral to EP to evaluate candidacy for Watchman device.   HFrEF - Echo during admission 12/22/2023 showed EF 55-60%. Subsequent TEE 01/03/2024 showed EF 30-35%. Likely tachycardia mediated. He remains volume overloaded today. I will discontinue Lasix  and start torsemide 40 mg daily. Check BMP in 1-2 weeks. Would benefit from addition of SGLT2i. Will consut with pharmacy to see if Jardiance or Doreen is affordable. Addition of MRA and ARB/ARNI is limited by hypotension and renal dysfunction. Patient inquired about cardiac rehabilitation, could possible benefit in the future.   Hyperlipidemia - Most recent lipid panel 12/2023 with LDL 68.  He is continued on atorvastatin  40 mg daily.  Alcohol and tobacco use -  Recommend cessation  CKD IIIa - Creatinine up slightly from baseline at 1.83 on 10/16.  Suspect this could be cardiorenal.  Will repeat BMP in 1 to 2 weeks with escalation of diuresis.  OSA - Back on CPAP.  Upcoming appointment with pulmonology on Monday.     Disposition: Stop Lasix .  Start torsemide 40 mg daily.  Plan for initiation of SGLT2 inhibitor.  Update BMP in 1 to 2 weeks.  Refer to EP.  F/u with Dr. Mady or an APP in 1 month.   Medication Adjustments/Labs and Tests Ordered: Current medicines are reviewed at length with the patient today.  Concerns regarding medicines are outlined above. Medication changes, Labs and Tests ordered today are summarized above and listed in the Patient Instructions accessible in Encounters.   Signed, Lesley Maffucci, PA-C 02/02/2024 12:13 PM     [facility] [place] [address] [place], [place] [address] [phone]

## 2024-02-02 NOTE — Patient Instructions (Signed)
 Medication Instructions:   Your physician recommends the following medication changes.  STOP TAKING: Furosemide  (Lasix )  START TAKING: Torsemide 40 mg once daily  *If you need a refill on your cardiac medications before your next appointment, please call your pharmacy*  Lab Work:  Your provider would like for you to return on 02/13/24 to have the following labs drawn: BMet.   Please get lab work done at your next appointment with Dr. Zenaida on 02/13/24  If you have labs (blood work) drawn today and your tests are completely normal, you will receive your results only by:  MyChart Message (if you have MyChart) OR  A paper copy in the mail If you have any lab test that is abnormal or we need to change your treatment, we will call you to review the results.  Testing/Procedures:  None ordered at this time   Referrals:  Your cardiologist has referred you to Electrophysiology  We have attached their office location and phone number below.  Please allow them 3-5 business days to reach out to you to make an appointment.  If you have not heard from their office within that time, please call them to schedule your appointment.    Follow-Up:  At [facility], you and your health needs are our priority.  As part of our continuing mission to provide you with exceptional heart care, our providers are all part of one team.  This team includes your primary Cardiologist (physician) and Advanced Practice Providers or APPs (Physician Assistants and Nurse Practitioners) who all work together to provide you with the care you need, when you need it.  Your next appointment:   1 month(s)  Provider:    Lesley Maffucci, PA-C    We recommend signing up for the patient portal called MyChart.  Sign up information is provided on this After Visit Summary.  MyChart is used to connect with patients for Virtual Visits (Telemedicine).  Patients are able to view lab/test results, encounter notes,  upcoming appointments, etc.  Non-urgent messages can be sent to your provider as well.   To learn more about what you can do with MyChart, go to [email or web address].   Other Instructions  We have sent a message to our pharmacy team to determine if Jardiance or Doreen would be the more cost effective option for you.  Please allow them to reach out to you with any further information they may require to effectively complete this task.

## 2024-02-05 ENCOUNTER — Telehealth: Payer: Self-pay | Admitting: Pharmacy Technician

## 2024-02-05 ENCOUNTER — Ambulatory Visit (INDEPENDENT_AMBULATORY_CARE_PROVIDER_SITE_OTHER): Admitting: Sleep Medicine

## 2024-02-05 ENCOUNTER — Telehealth: Payer: Self-pay

## 2024-02-05 ENCOUNTER — Encounter: Payer: Self-pay | Admitting: Sleep Medicine

## 2024-02-05 ENCOUNTER — Other Ambulatory Visit (HOSPITAL_COMMUNITY): Payer: Self-pay

## 2024-02-05 VITALS — BP 110/70 | HR 69 | Temp 98.3°F | Ht 69.0 in | Wt 211.0 lb

## 2024-02-05 DIAGNOSIS — I10 Essential (primary) hypertension: Secondary | ICD-10-CM | POA: Diagnosis not present

## 2024-02-05 DIAGNOSIS — G4733 Obstructive sleep apnea (adult) (pediatric): Secondary | ICD-10-CM

## 2024-02-05 DIAGNOSIS — R0602 Shortness of breath: Secondary | ICD-10-CM

## 2024-02-05 DIAGNOSIS — J309 Allergic rhinitis, unspecified: Secondary | ICD-10-CM

## 2024-02-05 DIAGNOSIS — I480 Paroxysmal atrial fibrillation: Secondary | ICD-10-CM

## 2024-02-05 LAB — HEMOGLOBIN A1C
Est. average glucose Bld gHb Est-mCnc: 126 mg/dL
Hgb A1c MFr Bld: 6 % — ABNORMAL HIGH (ref 4.8–5.6)

## 2024-02-05 LAB — AMIODARONE (CORDARONE), S/P
AMIODARONE: 651 ng/mL — AB (ref 1000–2500)
DESETHYLAMIODARONE: 702 ng/mL

## 2024-02-05 LAB — RENAL FUNCTION PANEL
Albumin: 3.5 g/dL — ABNORMAL LOW (ref 3.7–4.7)
BUN/Creatinine Ratio: 13 (ref 10–24)
BUN: 23 mg/dL (ref 8–27)
CO2: 27 mmol/L (ref 20–29)
Calcium: 8.9 mg/dL (ref 8.6–10.2)
Chloride: 97 mmol/L (ref 96–106)
Creatinine, Ser: 1.83 mg/dL — ABNORMAL HIGH (ref 0.76–1.27)
Glucose: 110 mg/dL — ABNORMAL HIGH (ref 70–99)
Phosphorus: 3.1 mg/dL (ref 2.8–4.1)
Potassium: 4.5 mmol/L (ref 3.5–5.2)
Sodium: 136 mmol/L (ref 134–144)
eGFR: 36 mL/min/1.73 — ABNORMAL LOW (ref >59)

## 2024-02-05 LAB — SPECIMEN STATUS REPORT

## 2024-02-05 LAB — TSH: TSH: 5.46 u[IU]/mL — ABNORMAL HIGH (ref 0.450–4.500)

## 2024-02-05 LAB — T4, FREE: Free T4: 1.17 ng/dL (ref 0.82–1.77)

## 2024-02-05 NOTE — Patient Instructions (Addendum)

## 2024-02-05 NOTE — Transitions of Care (Post Inpatient/ED Visit) (Signed)
 Transition of Care Week 5 follow up/final  Visit Note  02/05/2024  Name: Cameron Foster. MRN: [id number]          DOB: 11-22-1940  Situation: Patient enrolled in [facility] 30-day program. Visit completed with patient and sister, Norvel, by telephone.   Background:   Initial Transition Care Management Follow-up Telephone Call Discharge Date and Diagnosis: 01/04/24, Atrial Fib; Heart Failure   Past Medical History:  Diagnosis Date   Alcohol dependence (HCC) 09/30/2021   Alcoholism (HCC)    Allergy    Aortic atherosclerosis    Arthritis    Ascending aorta dilation 03/28/2018   a.) Vascular US : prox asc Ao measured 29 mm. b.)  CT CAP 06/22/2021: Ao root 41 mm. c.) TTE 06/26/2021: Ao root 41 mm; asc Ao 39 mm   Atrial fibrillation (HCC) 1995   a.) single episode in 1995 per patient; no long term treatment. b.) recurrent episode in the setting of GI bleeding related to colitis 06/2021.   Basal cell carcinoma 04/26/2017   Right medial cheek. Superficial and nodular   Basal cell carcinoma 06/11/2019   Left anterior shoulder. Nodular pattern   Basal cell carcinoma 09/09/2019   Right nasal ala, EDC   Basal cell carcinoma 02/05/2020   L upper eyebrow, EDC    Basal cell carcinoma 10/12/2022   left post auricular, EDC   Basal cell carcinoma 05/02/2023   Right medial cheek, EDC   Bilateral carotid artery stenosis 06/09/2019   a.) Carotid doppler: mod; <50% BILATERAL ICAs.   Cataract    Chicken pox    Colon cancer (HCC)    Colon polyp    Depression    Son died 02-18-15   Diverticulitis    Diverticulosis 30 years   Encounter for other preprocedural examination 06/21/2021   Gastritis    GERD (gastroesophageal reflux disease)    H. pylori infection    History of stress test    a. 09/2015 MV: No ischemia/infarct. EF 45-54% (nl by echo).   Hyperlipidemia    Hypertension    Irritable bowel syndrome    Lacunar infarction (HCC) 06/08/2019   a.) small; RIGHT motor strip   Myocardial  infarction [facility])    Neuromuscular disorder (HCC)    NSVT (nonsustained ventricular tachycardia) (HCC)    a.)  Single episode lasting 5 beats at a maximum rate of 160 bpm noted on Holter study performed 08/13/2021.   Prostate cancer (HCC) February 18, 2011   a.) s/p XRT   PSVT (paroxysmal supraventricular tachycardia)    a. 06/2019 Zio: Avg rate 74 (54-120), occas PACs, rare PVCs, 125 episodes of PVCs (longest 17.5 secs; max rate 187). No afib.   RBBB    SAH (subarachnoid hemorrhage) (HCC) 10/10/2018   Sleep apnea    Squamous cell carcinoma of skin 07/18/2017   Left medial calf. KA type   Squamous cell carcinoma of skin 04/24/2018   Right above med. brow   Squamous cell carcinoma of skin 06/11/2019   Right posterior shoulder. SCCis, hypertrophic   Squamous cell carcinoma of skin 01/09/2020   Mid nasal dorsum, MOHS, Efudex x 4wks   Stroke [facility])    Substance abuse (HCC)    Systolic dysfunction    a.) TTE 10/05/2015: EF 50-55%; mild LVH; LAE; triv AR, mild MR. b.) TTE 03/29/2018: EF 55-60%; LAE, mild AR; ? small PFO. c.) TTE 06/09/2019: EF 55-60%, no rwma, triv MR/AI. d.) TTE 06/26/2021: EF 45-50%; glob HK; LAE; mild MR; Ao root 41 mm; asc Ao  39 mm.   Vasovagal syncope     Assessment: Patient Reported Symptoms: Cognitive Cognitive Status: No symptoms reported, Able to follow simple commands, Alert and oriented to person, place, and time, Normal speech and language skills      Neurological Neurological Review of Symptoms: Other: [place] Neurological Symptoms/Conditions [RPT]: jerky movements of hands, MD was notified by sister, [place] Neurological Management Strategies: Routine screening, Medication therapy Neurological Self-Management Outcome: 3 (uncertain)  HEENT HEENT Symptoms Reported: Nasal discharge (post nasal drip)      Cardiovascular Cardiovascular Symptoms Reported: Fatigue Does patient have uncontrolled Hypertension?: Yes Is patient checking Blood Pressure at home?: Yes Patient's  Recent BP reading at home: 152/94 (checked by aide today was just coming from bathroom) 119/71 after resting Cardiovascular Management Strategies: Activity, Adequate rest, Medication therapy Weight: 211 lb (95.7 kg) Cardiovascular Self-Management Outcome: 3 (uncertain) Cardiovascular Comment: medication was increase and lost weight  Respiratory Respiratory Symptoms Reported: Productive cough, Shortness of breath Other Respiratory Symptoms: on going post nasal drip Respiratory Management Strategies: Activity, Adequate rest, CPAP, Medication therapy Respiratory Self-Management Outcome: 3 (uncertain) (it's up and down, I wore myself out though Saturday)  Endocrine Endocrine Symptoms Reported: No symptoms reported    Gastrointestinal Gastrointestinal Symptoms Reported: No symptoms reported Additional Gastrointestinal Details: HX of colitis; GI Bleed, denies s/s Gastrointestinal Management Strategies: Medication therapy    Genitourinary Genitourinary Symptoms Reported: No symptoms reported, Frequency (frequesncy due to increase in diuretic managment) Genitourinary Management Strategies: Fluid modification, Medication therapy Genitourinary Self-Management Outcome: 4 (good)  Integumentary Integumentary Symptoms Reported: No symptoms reported    Musculoskeletal Musculoskelatal Symptoms Reviewed: Unsteady gait, Weakness Additional Musculoskeletal Details: I think I over did it thies weekend, I went to the family reunion Saturday and I was worn out all day yesterday Musculoskeletal Management Strategies: Activity, Adequate rest, Exercise, Medication therapy, Routine screening Musculoskeletal Self-Management Outcome: 2 (bad) Musculoskeletal Comment: Home Health therapy      Psychosocial Psychosocial Symptoms Reported: No symptoms reported         Vitals:   02/05/24 1132  BP: (!) 152/94  Pulse: 72    Medications Reviewed Today     Reviewed by Eilleen Richerd GRADE, RN (Registered Nurse)  on 02/05/24 at 1145  Med List Status: <None>   Medication Order Taking? Sig Documenting Provider Last Dose Status Informant  albuterol  (VENTOLIN  HFA) 108 (90 Base) MCG/ACT inhaler [id number]  Inhale 2 puffs into the lungs every 6 (six) hours as needed for wheezing or shortness of breath. Levander Slate, MD  Active Family Member           Med Note Barling, RICHERD GRADE Debar Dec 26, 2023  2:22 PM) Patient has on hand but not taking until needed  Alpha-Lipoic Acid 600 MG TABS [id number]  Take 1 tablet by mouth daily. [provider]  Active Family Member  amiodarone  (PACERONE ) 200 MG tablet [id number]  Take 1 tablet (200 mg total) by mouth 2 (two) times daily. [place] A, FNP  Active   apixaban  (ELIQUIS ) 5 MG TABS tablet [id number]  Take 1 tablet (5 mg total) by mouth 2 (two) times daily. [place] A, FNP  Active   atorvastatin  (LIPITOR) 40 MG tablet [id number]  Take 1 tablet (40 mg total) by mouth daily. Myrla Jon [name], MD  Active Family Member  cetirizine  (ZYRTEC ) 10 MG tablet [id number]  Take 10 mg by mouth daily. [provider]  Active Family Member  Cholecalciferol (VITAMIN D-3) 25 MCG (1000 UT) CAPS [id number]  Take  1,000 Units by mouth daily. [provider]  Active Family Member  dextromethorphan -guaiFENesin  (MUCINEX  DM) 30-600 MG 12hr tablet [id number]  Take 1 tablet by mouth 2 (two) times daily as needed for cough. [provider]  Active Family Member  docusate sodium  (COLACE) 100 MG capsule [id number]  Take 1 capsule (100 mg total) by mouth 2 (two) times daily.  Patient taking differently: Take 200 mg by mouth daily.   Joshua Lin, PA-C  Active Family Member           Med Note BOYS, [facility]   Thu Dec 21, 2023  7:30 AM)    fluticasone  (FLONASE ) 50 MCG/ACT nasal spray [id number]  PLACE 1 SPRAY INTO BOTH NOSTRILS DAILY AS NEEDED FOR ALLERGIES OR RHINITIS. Myrla Jon [name], MD  Active Family Member  folic acid  (FOLVITE ) 1 MG tablet [id number]   Take 1 tablet (1 mg total) by mouth daily. Caleen Qualia, MD  Active Family Member  hydrocortisone  2.5 % cream [id number]  APPLY TO AFFECTED AREA TWICE A DAY AS NEEDED FOR RASH Bacigalupo, Jon [name], MD  Active Family Member  ketoconazole  (NIZORAL ) 2 % cream [id number]  Apply once or twice daily to affected skin folds as needed for rash Jackquline Sawyer, MD  Active Family Member  losartan  (COZAAR ) 25 MG tablet [id number]  Take 1 tablet (25 mg total) by mouth daily.  Patient not taking: Reported on 02/05/2024   Donette Ellouise LABOR, FNP  Active   metoprolol  succinate (TOPROL -XL) 100 MG 24 hr tablet [id number] Yes Take 1 tablet (100 mg total) by mouth daily. Take with or immediately following a meal. Donette Ellouise A, FNP  Active   metoprolol  tartrate (LOPRESSOR ) 25 MG tablet [id number]   [provider]  Active   Multiple Vitamin (MULTIVITAMIN WITH MINERALS) TABS tablet [id number]  Take 1 tablet by mouth daily. Vachhani, Vaibhavkumar, MD  Active Family Member  omeprazole  (PRILOSEC) 20 MG capsule [id number]  TAKE 1 CAPSULE (20 MG TOTAL) BY MOUTH DAILY. REPORTS CURRENTLY TAKING AS NEEDED  Patient taking differently: Take 20 mg by mouth daily. Per sister and note 01/18/24 patient to take 2- 20 MEQ tablets daily until next ofv 01/23/24 states per Ellouise Donette from [facility]   Bacigalupo, Jon [name], MD  Active Family Member  potassium chloride  SA (KLOR-CON  M) 20 MEQ tablet [id number]  Take 2 tablets (40 mEq total) by mouth daily. Donette Ellouise A, FNP  Active   pregabalin  (LYRICA ) 25 MG capsule [id number]  Take 25 mg by mouth 2 (two) times daily.  Patient not taking: Reported on 02/02/2024   [provider]  Active Family Member  sertraline  (ZOLOFT ) 100 MG tablet [id number]  Take 1 tablet (100 mg total) by mouth daily. Myrla Jon [name], MD  Active Family Member  tadalafil  (CIALIS ) 5 MG tablet [id number]  Take 1 tablet (5 mg total) by mouth daily as needed for erectile dysfunction. Myrla Jon [name], MD  Active  Family Member           Med Note SIMMIE, RAVEN M   Fri Jul 07, 2023  4:09 PM) Aware to take EVERY OTHER DAY  tadalafil  (CIALIS ) 5 MG tablet [id number]  Take 1 tablet (5 mg total) by mouth every other day as needed.  Patient taking differently: Take 5 mg by mouth every other day.   Donzella Lauraine SAILOR, DO  Active Family Member  thiamine  (VITAMIN B1) 100 MG tablet [id number]  Take 100 mg by mouth daily. [provider]  Active Family Member  torsemide (DEMADEX) 20 MG tablet [id number] Yes Take 2 tablets (40 mg total) by mouth daily. Lorene Lesley CROME, PA-C  Active   vitamin B-12 (CYANOCOBALAMIN ) 1000 MCG tablet [id number]  Take 1,000 mcg by mouth daily. [provider]  Active Family Member  zinc gluconate 50 MG tablet [id number]  Take 50 mg by mouth daily. [provider]  Active Family Member  Med List Note Lesly, Richerd GRADE, RN 02/05/24 1145): Patient states, my sister, Norvel, manages all of my medicines and keeps up with the changes and set it up for me          Reviewed medication changes from 02/02/24 appointment. Started on torsemide 40 mg daily. Plan for initiation of SGLT2 inhibitor. Follow up from pharmacist to speak with patient and sister [place]. Norvel confirms ongoing follow up with medications and cost.  Discussed and offered CCM  program.  Patient and sister, Norvel agrees.  The patient has been provided with contact information for the care management team and has been advised to call with any health -related questions or concerns.  The patient verbalized understanding with current plan of care.  The patient is directed to their insurance card regarding availability of benefits coverage.    Recommendation:   Referral to: Longitudinal Complex Care Coordination  Follow Up Plan:   Referral to RN Case Manager Closing From:  Transitions of Care Program Patient has met all care management goals. Care Management case will be closed. Patient has been  provided contact information should new needs arise.   [name], RN, BSN, CCM [facility], [facility] RN Care Manager Direct Dial: [phone]

## 2024-02-05 NOTE — Telephone Encounter (Signed)
 Patient Advocate Encounter   The patient was approved for a [employer] grant that will help cover the cost of jardiance Total amount awarded, 7500.  Effective: 01/06/24 - 01/05/25   APW:[id number] ERW:[id number] Hmnle:[id number] PI:[id number] [employer] ID: [id number]   Pharmacy provided with approval and processing information. Patient informed via mychart

## 2024-02-05 NOTE — Progress Notes (Signed)
 Name:Cameron R [employer] MRN: [id number] DOB: 16-Jan-1941   CHIEF COMPLAINT:  ESTABLISH CARE FOR OSA   HISTORY OF PRESENT ILLNESS: Cameron Foster is a 83 y.o. w/ a h/o OSA, atrial fibrillation, CHF, obesity and GERD who presents to establish care for OSA. Reports that he was initially diagnosed with OSA several years ago and was reassessed with a home sleep study last year. Reports using CPAP therapy every night, which is confirmed by compliance data. He is currently using the Airfit F20 FFM, which is comfortable. Reports using CPAP therapy every night, which is confirmed by compliance data. States that the air feels too warm and is uncomfortable. Reports not feeling any more refreshed upon awakening with CPAP therapy.   Denies snoring with CPAP therapy. Reports nocturnal awakenings due to unclear reasons and has difficulty falling back to sleep. Denies any significant weight changes. Denies morning headaches, RLS symptoms, dream enactment, cataplexy, hypnagogic or hypnapompic hallucinations. Denies a family history of sleep apnea. Denies drowsy driving. Drinks 1-2 sodas daily, occasional alcohol use, uses tobacco dip, denies illicit drug use.   Bedtime 10-11 pm Sleep onset 10 mins Rise time 7-9 am   EPWORTH SLEEP SCORE 7    02/05/2024    2:00 PM  Results of the Epworth flowsheet  Sitting and reading 1  Watching TV 1  Sitting, inactive in a public place (e.g. a theatre or a meeting) 0  As a passenger in a car for an hour without a break 0  Lying down to rest in the afternoon when circumstances permit 1  Sitting and talking to someone 1  Sitting quietly after a lunch without alcohol 2  In a car, while stopped for a few minutes in traffic 1  Total score 7    PAST MEDICAL HISTORY :   has a past medical history of Alcohol dependence (HCC) (09/30/2021), Alcoholism (HCC), Allergy, Aortic atherosclerosis, Arthritis, Ascending aorta dilation (03/28/2018), Atrial fibrillation (HCC)  (1995), Basal cell carcinoma (04/26/2017), Basal cell carcinoma (06/11/2019), Basal cell carcinoma (09/09/2019), Basal cell carcinoma (02/05/2020), Basal cell carcinoma (10/12/2022), Basal cell carcinoma (05/02/2023), Bilateral carotid artery stenosis (06/09/2019), Cataract, Chicken pox, Colon cancer (HCC), Colon polyp, Depression, Diverticulitis, Diverticulosis (30 years), Encounter for other preprocedural examination (06/21/2021), Gastritis, GERD (gastroesophageal reflux disease), H. pylori infection, History of stress test, Hyperlipidemia, Hypertension, Irritable bowel syndrome, Lacunar infarction (HCC) (06/08/2019), Myocardial infarction [facility]), Neuromuscular disorder (HCC), NSVT (nonsustained ventricular tachycardia) (HCC), Prostate cancer (HCC) (2012), PSVT (paroxysmal supraventricular tachycardia), RBBB, SAH (subarachnoid hemorrhage) (HCC) (10/10/2018), Sleep apnea, Squamous cell carcinoma of skin (07/18/2017), Squamous cell carcinoma of skin (04/24/2018), Squamous cell carcinoma of skin (06/11/2019), Squamous cell carcinoma of skin (01/09/2020), Stroke (HCC), Substance abuse (HCC), Systolic dysfunction, and Vasovagal syncope.  has a past surgical history that includes Prostate surgery; Colonoscopy (2016); Esophagogastroduodenoscopy; Esophagogastroduodenoscopy (N/A, 05/30/2016); Colonoscopy with propofol  (N/A, 05/30/2016); Tonsillectomy; Cataract extraction, bilateral; Fracture surgery (Bilateral); Total knee arthroplasty (Right, 07/27/2016); laparotomy (N/A, 12/07/2016); Colon resection sigmoid (N/A, 12/07/2016); Colostomy (Left, 12/07/2016); Incision and drainage abscess (N/A, 12/07/2016); Colon surgery (11/2016); Eye surgery; Colostomy takedown (N/A, 03/21/2017); Colostomy reversal (N/A, 03/21/2017); Flexible sigmoidoscopy (N/A, 03/21/2017); Cystoscopy with stent placement (Bilateral, 03/21/2017); Cardiac catheterization (1998); Esophagogastroduodenoscopy (N/A, 01/14/2021); Esophagogastroduodenoscopy (egd)  with propofol  (N/A, 04/28/2021); Flexible sigmoidoscopy (N/A, 06/24/2021); Total hip arthroplasty (Right, 09/27/2021); Joint replacement; TEE without cardioversion (N/A, 01/03/2024); Cardioversion (N/A, 01/03/2024); and Cardioversion (N/A, 01/17/2024). Prior to Admission medications   Medication Sig Start Date End Date Taking? Authorizing Provider  albuterol  (VENTOLIN  HFA) 108 (90  Base) MCG/ACT inhaler Inhale 2 puffs into the lungs every 6 (six) hours as needed for wheezing or shortness of breath. 09/12/23  Yes Ray, Neha, MD  Alpha-Lipoic Acid 600 MG TABS Take 1 tablet by mouth daily.   Yes [provider]  amiodarone  (PACERONE ) 200 MG tablet Take 1 tablet (200 mg total) by mouth 2 (two) times daily. 01/23/24  Yes Hackney, Ellouise A, FNP  apixaban  (ELIQUIS ) 5 MG TABS tablet Take 1 tablet (5 mg total) by mouth 2 (two) times daily. 01/23/24  Yes Hackney, Tina A, FNP  atorvastatin  (LIPITOR) 40 MG tablet Take 1 tablet (40 mg total) by mouth daily. 01/10/24  Yes Bacigalupo, Jon [name], MD  cetirizine  (ZYRTEC ) 10 MG tablet Take 10 mg by mouth daily.   Yes [provider]  Cholecalciferol (VITAMIN D-3) 25 MCG (1000 UT) CAPS Take 1,000 Units by mouth daily.   Yes [provider]  dextromethorphan -guaiFENesin  (MUCINEX  DM) 30-600 MG 12hr tablet Take 1 tablet by mouth 2 (two) times daily as needed for cough.   Yes [provider]  docusate sodium  (COLACE) 100 MG capsule Take 1 capsule (100 mg total) by mouth 2 (two) times daily. Patient taking differently: Take 200 mg by mouth daily. 09/30/21  Yes Joshua Lin, PA-C  fluticasone  (FLONASE ) 50 MCG/ACT nasal spray PLACE 1 SPRAY INTO BOTH NOSTRILS DAILY AS NEEDED FOR ALLERGIES OR RHINITIS. 10/17/23  Yes Bacigalupo, Jon [name], MD  folic acid  (FOLVITE ) 1 MG tablet Take 1 tablet (1 mg total) by mouth daily. 12/24/23  Yes Caleen Qualia, MD  hydrocortisone  2.5 % cream APPLY TO AFFECTED AREA TWICE A DAY AS NEEDED FOR RASH 02/20/23  Yes Bacigalupo,  Jon [name], MD  ketoconazole  (NIZORAL ) 2 % cream Apply once or twice daily to affected skin folds as needed for rash 05/02/23  Yes Jackquline Sawyer, MD  metoprolol  succinate (TOPROL -XL) 100 MG 24 hr tablet Take 1 tablet (100 mg total) by mouth daily. Take with or immediately following a meal. 01/23/24  Yes Donette Ellouise LABOR, FNP  metoprolol  tartrate (LOPRESSOR ) 25 MG tablet    Yes [provider]  Multiple Vitamin (MULTIVITAMIN WITH MINERALS) TABS tablet Take 1 tablet by mouth daily. 10/24/18  Yes Vachhani, Vaibhavkumar, MD  omeprazole  (PRILOSEC) 20 MG capsule TAKE 1 CAPSULE (20 MG TOTAL) BY MOUTH DAILY. REPORTS CURRENTLY TAKING AS NEEDED Patient taking differently: Take 20 mg by mouth daily. Per sister and note 01/18/24 patient to take 2- 20 MEQ tablets daily until next ofv 01/23/24 states per Ellouise Donette from [facility] 09/21/23  Yes Bacigalupo, Jon [name], MD  potassium chloride  SA (KLOR-CON  M) 20 MEQ tablet Take 2 tablets (40 mEq total) by mouth daily. 01/23/24  Yes Hackney, Ellouise A, FNP  sertraline  (ZOLOFT ) 100 MG tablet Take 1 tablet (100 mg total) by mouth daily. 03/27/23  Yes Bacigalupo, Jon [name], MD  tadalafil  (CIALIS ) 5 MG tablet Take 1 tablet (5 mg total) by mouth daily as needed for erectile dysfunction. 07/07/23  Yes Bacigalupo, Jon [name], MD  tadalafil  (CIALIS ) 5 MG tablet Take 1 tablet (5 mg total) by mouth every other day as needed. Patient taking differently: Take 5 mg by mouth every other day. 07/05/23  Yes Pardue, Lauraine SAILOR, DO  thiamine  (VITAMIN B1) 100 MG tablet Take 100 mg by mouth daily.   Yes [provider]  torsemide (DEMADEX) 20 MG tablet Take 2 tablets (40 mg total) by mouth daily. 02/02/24  Yes Lorene Sinclair L, PA-C  vitamin B-12 (CYANOCOBALAMIN ) 1000 MCG tablet  Take 1,000 mcg by mouth daily.   Yes [provider]  zinc gluconate 50 MG tablet Take 50 mg by mouth daily.   Yes [provider]  losartan  (COZAAR ) 25 MG tablet Take 1 tablet (25 mg total) by mouth  daily. Patient not taking: Reported on 02/05/2024 01/31/24   Donette Ellouise LABOR, FNP  pregabalin  (LYRICA ) 25 MG capsule Take 25 mg by mouth 2 (two) times daily. Patient not taking: Reported on 02/05/2024    [provider]   Allergies  Allergen Reactions   Amoxicillin -Pot Clavulanate Diarrhea and Other (See Comments)    Abdominal upset  amoxicillin  / clavulanate   Formaldehyde Rash    From shoes made with this material    FAMILY HISTORY:  family history includes Bipolar disorder in his son; Cancer in his father; Early death in his son; Heart disease in his son; Kidney disease in his daughter; Learning disabilities in his son; Lung cancer in his father; Other in his mother; Sudden death in his son; Varicose Veins in his sister; Vision loss in his mother. SOCIAL HISTORY:  reports that he quit smoking about 35 years ago. His smoking use included cigarettes. He started smoking about 62 years ago. He has a 27 pack-year smoking history. His smokeless tobacco use includes chew. He reports that he does not currently use alcohol after a past usage of about 7.0 standard drinks of alcohol per week. He reports that he does not use drugs.   Review of Systems:  Gen:  Denies  fever, sweats, chills weight loss  HEENT: Denies blurred vision, double vision, ear pain, eye pain, hearing loss, nose bleeds, sore throat Cardiac:  No dizziness, chest pain or heaviness, chest tightness,edema, No JVD Resp:   No cough, -sputum production, -shortness of breath,-wheezing, -hemoptysis,  Gi: Denies swallowing difficulty, stomach pain, nausea or vomiting, diarrhea, constipation, bowel incontinence Gu:  Denies bladder incontinence, burning urine Ext:   Denies Joint pain, stiffness or swelling Skin: Denies  skin rash, easy bruising or bleeding or hives Endoc:  Denies polyuria, polydipsia , polyphagia or weight change Psych:   Denies depression, insomnia or hallucinations  Other:  All other systems  negative  VITAL SIGNS: BP 110/70   Pulse 69   Temp 98.3 F (36.8 C)   Ht 5' 9 (1.753 m)   Wt 211 lb (95.7 kg)   SpO2 92%   BMI 31.16 kg/m    Physical Examination:   General Appearance: No distress  EYES PERRLA, EOM intact.   NECK Supple, No JVD Pulmonary: normal breath sounds, No wheezing.  CardiovascularNormal S1,S2.  No m/r/g.   Abdomen: Benign, Soft, non-tender. Skin:   warm, no rashes, no ecchymosis  Extremities: normal, no cyanosis, clubbing. Neuro:without focal findings,  speech normal  PSYCHIATRIC: Mood, affect within normal limits.   ASSESSMENT AND PLAN  OSA Patient is using and benefiting from CPAP therapy. To improve comfort, decreased tube temperature to 72 degrees and will try patient on the Airfit F40 FFM.  Discussed the consequences of untreated sleep apnea. Advised not to drive drowsy for safety of patient and others. Will follow up in 3 months.     HTN Stable, on current management. Following with PCP.    Patient  satisfied with Plan of action and management. All questions answered  I spent a total of 62 minutes reviewing chart data, face-to-face evaluation with the patient, counseling and coordination of care as detailed above.    Mckale Haffey, M.D.  Sleep Medicine [place] [facility]

## 2024-02-05 NOTE — Telephone Encounter (Signed)
   For 30 days jardiance -146.85  For 30 days farxiga-139.91  Ins wont pay for generic farxiga

## 2024-02-05 NOTE — Patient Instructions (Addendum)
 Visit Information  Thank you for taking time to visit with me today. Please don't hesitate to contact me if I can be of assistance to you before our next scheduled telephone appointment.  Our next appointment is with longitudinal RN CCM within the next 30 days.  Following is a copy of your care plan:   Goals Addressed             This Visit's Progress    COMPLETED: VBCI Transitions of Care (TOC) Care Plan   On track    Problems:  Recent Hospitalization for treatment of Atrial Fibrillation 01/05/24 Heart Failure Patient needs ongoing assistance for ADLs sister states PCP made a referral for this. Discussed that it may be an out of pocket cost and states they are waiting for a callback from the MDs referral made today 01/05/24 01/19/24 Patient an overnight weight gain and medications were adjusted with Lasix  and Potassium from HF clinic.  Patient to stay on regimen until 01/23/24 appointment unless he starts to feel dehydrated per notes.  01/30/24 Added to schedule today for follow up. Ongoing low energy states, I just want to feel good there's nothing in particular.   Goal:  Over the next 30 days, the patient will not experience hospital readmission  Interventions:   AFIB Interventions:   Advised patient to discuss worsening symptoms with provider Continue to take medications as prescribed and follow up with [facility] regarding managing packing the medication DC instructions reviewed with sister, Norvel on AVS: Call MD for: difficulty breathing, headache or visual disturbances Call MD for: persistant dizziness or light-headedness Call MD for: persistant nausea and vomiting Call MD for: severe uncontrolled pain Call MD for: temperature >100.4 Diet general Discharge instructions Follow up with your primary care physician to discuss the medication changes during this admission. Continue close follow up with cardiology. Use your albuterol  inhaler every 4 hours  as needed. Mucinex  DM for coughing and mucus production. Do NOT use Mucinex  D.  01/19/24  Review HF instructions: Record daily weight on same scale at same time of day. Reviewed last weight at hospital 209.14 oz today 212 lb at home noted. 01/12/24 Patient to continue to monitor weight daily and BP, avoid any additional sodium, read labels, add no salt but try lemon juice, onions, peppers, garlic for added flavor to food. Try low sodium protein shakes, puddings. 01/19/24 -  01/18/24 Sister states she notified the HF office and Ellouise Class, FNP. Medications adjusted until appointment 01/23/24. She also caution patient if feeling dehydrated to return to original regimen, continue to monitor weight. Patient weighed today noted with shoes, belt, wallet, and fully clothe and was up 2 pounds and sister states that he knows to weigh with the same daily without clothes.   Patient Self Care Activities:  Attend all scheduled provider appointments Call pharmacy for medication refills 3-7 days in advance of running out of medications Call provider office for new concerns or questions  Notify RN Care Manager of [facility] call rescheduling needs Participate in Transition of Care Program/Attend TOC scheduled calls Take medications as prescribed   01/12/24 Norvel, sister states that [facility] is suppose to start Monday for personal care services. Also, [facility] has come out and done the assessment for PT/OT home health on Monday also.  Plan:  The care management team will reach out to the patient again over the next 5-10 business days. The patient has been provided with contact information for the care management team and has  been advised to call with any health related questions or concerns. Appointment made for next week for enrolled in [facility] as patient and sister requested to end call but made appointment for next week follow up. 01/12/24 Continue to restrict sodium as directed and fluid intake. 01/19/24 Patient and  sister caution from HF clinic to monitor for dehydration since medications were adjusted for the overnight weight gain. Sister verbalizes understanding.  01/30/24 Continue to weigh and monitor for symptoms. Follow up with PCP as needed - has appointment scheduled for 02/01/24, patient aware. 02/05/24 Patient to be referred to longitudinal RN for ongoing care needs. Explained they can decline at anytime but will follow up within the next 30 days.         Patient verbalizes understanding of instructions and care plan provided today and agrees to view in MyChart. Active MyChart status and patient understanding of how to access instructions and care plan via MyChart confirmed with patient.    Sister Norvel, manages his MyChart appointments and medications. She agrees with ongoing Longitudinal Care Management.  The patient has been provided with contact information for the care management team and has been advised to call with any health related questions or concerns.  The Central Pharmacy team will follow up with the patient and will provide direct communication to the PCP for this patient.   Please call the care guide team at [phone] if you need to cancel or reschedule your appointment.   Please call the Suicide and Crisis Lifeline: 988 call the [place]  National Suicide Prevention Lifeline: (818)234-6004 or TTY: 810-574-5695 TTY 312-003-2962) to talk to a trained counselor call 1-800-273-TALK (toll free, 24 hour hotline) call 911 if you are experiencing a Mental Health or Behavioral Health Crisis or need someone to talk to.  [name], RN, BSN, CCM [facility], [facility] RN Care Manager Direct Dial: [phone]

## 2024-02-06 ENCOUNTER — Other Ambulatory Visit: Payer: Self-pay

## 2024-02-06 DIAGNOSIS — Z008 Encounter for other general examination: Secondary | ICD-10-CM | POA: Diagnosis not present

## 2024-02-06 DIAGNOSIS — I509 Heart failure, unspecified: Secondary | ICD-10-CM | POA: Diagnosis not present

## 2024-02-07 ENCOUNTER — Telehealth: Payer: Self-pay | Admitting: Emergency Medicine

## 2024-02-07 ENCOUNTER — Other Ambulatory Visit: Payer: Self-pay | Admitting: Emergency Medicine

## 2024-02-07 ENCOUNTER — Other Ambulatory Visit (HOSPITAL_COMMUNITY): Payer: Self-pay

## 2024-02-07 ENCOUNTER — Ambulatory Visit: Payer: Self-pay

## 2024-02-07 ENCOUNTER — Other Ambulatory Visit: Payer: Self-pay

## 2024-02-07 DIAGNOSIS — G253 Myoclonus: Secondary | ICD-10-CM

## 2024-02-07 MED ORDER — EMPAGLIFLOZIN 10 MG PO TABS
10.0000 mg | ORAL_TABLET | Freq: Every day | ORAL | 3 refills | Status: DC
  Filled 2024-02-07: qty 90, 90d supply, fill #0

## 2024-02-07 NOTE — Telephone Encounter (Signed)
 Called and left a message for the patient to call back to inform him that prescription for 10 mg Jardiance daily has been sent to [facility] at [facility].  MyChart message also sent to patient.

## 2024-02-07 NOTE — Telephone Encounter (Signed)
 FYI Only or Action Required?: Action required by provider: clinical question for provider and update on patient condition.  Patient was last seen in primary care on 02/01/2024 by Myrla Jon [name], MD.  Called Nurse Triage reporting Advice Only.  Symptoms began a week ago.  Interventions attempted: Other: office visit.  Symptoms are: unchanged.  Triage Disposition: Call PCP When Office is Open  Patient/caregiver understands and will follow disposition?: Yes   Copied from CRM [id number]. Topic: Clinical - Red Word Triage >> Feb 07, 2024 10:22 AM Montie POUR wrote: Red Word that prompted transfer to Nurse Triage:  He thinks he is having a medication reaction and does not know which medication is causing jerking and spasms throughout body. He said no other symptoms. He did call on 01/25/24 with same issue. Reason for Disposition  [1] Caller requesting NON-URGENT health information AND [2] PCP's office is the best resource  Answer Assessment - Initial Assessment Questions 1. REASON FOR CALL: What is the main reason for your call? or How can I best help you?     Continues to have jerking and spasms since last visit on 02/01/2024.   2. SYMPTOMS : Do you have any symptoms?      Symptoms persist, but have not worsened.   3. OTHER QUESTIONS: Do you have any other questions?     Patient would like follow up call for advice on how to manage these symptoms.  May reach patient on either number listed in chart  Protocols used: Information Only Call - No Triage-A-AH

## 2024-02-07 NOTE — Progress Notes (Signed)
 Per Lesley Maffucci, PA-C, prescription for 10 mg Jardiance daily sent to patient's pharmacy [facility] at [facility]). MyChart message sent to the patient after calling and leaving a message on patient's voicemail for the patient to call back.    Maffucci Lesley CROME, PA-C  Tobie Mac LABOR, RN Jardiance 10 mg daily. Sorry for the delayed response.       Previous Messages    ----- Message ----- From: Tobie Mac LABOR, RN Sent: 02/06/2024   5:00 PM EDT To: Lesley CROME Maffucci, PA-C  Hi Mariah.  What is the prescription dosage and frequency you want to order for this patient?  Abby, RN ----- Message ----- From: Glenice Krabbe, CPhT Sent: 02/05/2024  11:31 AM EDT To: Mac LABOR Tobie, RN  Hi, he is now enrolled in the healthwell grant and I have put the healthwell grant information in the [place] pharmacy system ----- Message ----- From: Tobie Mac LABOR, RN Sent: 02/05/2024  10:48 AM EDT To: Lesley CROME Maffucci, PA-C; Krabbe Glenice, CP*  Good morning Krabbe.  Lesley has recommended starting Jardiance for the patient. What do we need to do get the patient the HealthWell grant?    Good morning Mariah.  What is the dosage and frequency for Jardiance would you like for me to put the order in for?  Abby, RN ----- Message ----- From: Maffucci Lesley CROME DEVONNA Sent: 02/05/2024  10:36 AM EDT To: Mac LABOR Tobie, RN  Thanks, let's go ahead and prescribe jardiance. Can you call the patient and update him? ----- Message ----- From: Tobie Mac LABOR, RN Sent: 02/05/2024  10:33 AM EDT To: Lesley CROME Maffucci, PA-C   ----- Message ----- From: Glenice Krabbe, CPhT Sent: 02/05/2024  10:22 AM EDT To: Mac LABOR Tobie, RN  Hi, per test claims: For 30 days farxiga-$139.91 For 30 days jardiance -$146.85 Ins won't pay for generic farxiga He has caridomyopathy so we should be able to get him a heatlhwell grant to cover whichever they wanted to prescribe. ----- Message ----- From:  Tobie Mac LABOR, RN Sent: 02/02/2024  11:51 AM EDT To: Cv Div Pharmd; Rx Prior Auth Team; Rx Med AsDEWAINE Lesley Maffucci, PA-C would like to know if Jardiance or Cherryl would be the more cost effective option for the patient.

## 2024-02-08 ENCOUNTER — Telehealth: Payer: Self-pay

## 2024-02-08 NOTE — Progress Notes (Signed)
 Complex Care Management Note  Care Guide Note 02/08/2024 Name: Cameron Foster. MRN: [id number] DOB: Feb 05, 1941  [name] is a 83 y.o. year old male who sees Bacigalupo, Jon [name], MD for primary care. I reached out to [employer]. by phone today to offer complex care management services.  [name] was given information about Complex Care Management services today including:   The Complex Care Management services include support from the care team which includes your Nurse Care Manager, Clinical Social Worker, or Pharmacist.  The Complex Care Management team is here to help remove barriers to the health concerns and goals most important to you. Complex Care Management services are voluntary, and the patient may decline or stop services at any time by request to their care team member.   Complex Care Management Consent Status: Patient agreed to services and verbal consent obtained.   Follow up plan:  Telephone appointment with complex care management team member scheduled for:  03/01/24 @ 11 AM  Encounter Outcome:  Patient Scheduled  Leotis Rase [facility], [facility] Guide  Direct Dial: [phone]  Fax [phone]

## 2024-02-10 ENCOUNTER — Other Ambulatory Visit: Payer: Self-pay

## 2024-02-12 ENCOUNTER — Other Ambulatory Visit: Payer: Self-pay

## 2024-02-12 ENCOUNTER — Telehealth: Payer: Self-pay | Admitting: Cardiology

## 2024-02-12 ENCOUNTER — Telehealth: Payer: Self-pay | Admitting: Internal Medicine

## 2024-02-12 NOTE — Telephone Encounter (Signed)
  Pt c/o medication issue:  1. Name of Medication: empagliflozin (JARDIANCE) 10 MG TABS tablet   2. How are you currently taking this medication (dosage and times per day)?   Take 1 tablet (10 mg total) by mouth daily before breakfast.    3. Are you having a reaction (difficulty breathing--STAT)? Low bp  4. What is your medication issue?  BP is dropping too low, especially when he stands up, she states they did not write the readings down, he is having weakness also. She states she did not give him his does yesterday and his BP was good yesterday. Would like to know what to do.

## 2024-02-12 NOTE — Telephone Encounter (Signed)
 Spoke with the patient, who reported a decrease in his blood pressure over the past several weeks, particularly after taking his morning medication. The patient's physical therapist was also on the line and noted a drop in blood pressure since 02/08/24. She reported that BP readings from 10/17 and earlier were in the 140s, but have since dropped to around 100 and occasionally in the 90s (see readings below). The patient also mentioned experiencing muscle spasms and feeling jittery, stating that he often spills his drink due to involuntary movements. He mentioned being seen by his primary care provider, who recommended a neurology consultation.  The patient requested that the nurse contact his sister, as she is his primary caregiver. Nurse reached out to the sister, who reported a significant drop in the patient's blood pressure after starting Jardiance. She mentioned that the patient took Jardiance on Friday and Saturday before his other morning medications, and his BP was initially 90/60, then dropped to 85/50 after standing. She stated that she has since stopped the medication, and his blood pressure has somewhat improved. Yesterday, the BP was recorded at 122/70. The sister also noted that the patient has been experiencing involuntary jerks since his second hospitalization. They initially suspected the symptoms were related to amiodarone , as it was the most recent prescription; however, after the PCP ordered amiodarone  labs, the results were normal. The PCP recommended a neurology consult as the next step.  10/17-147/83  No date-107/78 10/23-98/63 No date-104/64 10/27-103/57  Will forward message to [facility] for further recommendations.

## 2024-02-12 NOTE — Telephone Encounter (Signed)
 Called to confirm/remind patient of their appointment at the [facility] on 02/13/24.   Appointment:   [x] Confirmed  [] Left mess   [] No answer/No voice mail  [] VM Full/unable to leave message  [] Phone not in service  Patient reminded to bring all medications and/or complete list.  Confirmed patient has transportation. Gave directions, instructed to utilize valet parking.

## 2024-02-12 NOTE — Addendum Note (Signed)
 Addended by: LILIAN SEVERO RAMAN on: 02/12/2024 09:09 AM   Modules accepted: Orders

## 2024-02-12 NOTE — Telephone Encounter (Signed)
 Pt advised. Verbalized understanding.

## 2024-02-13 ENCOUNTER — Ambulatory Visit: Attending: Cardiology | Admitting: Cardiology

## 2024-02-13 ENCOUNTER — Other Ambulatory Visit: Payer: Self-pay

## 2024-02-13 ENCOUNTER — Encounter: Payer: Self-pay | Admitting: Cardiology

## 2024-02-13 VITALS — BP 95/59 | HR 69 | Ht 68.0 in | Wt 217.0 lb

## 2024-02-13 DIAGNOSIS — E8770 Fluid overload, unspecified: Secondary | ICD-10-CM | POA: Diagnosis not present

## 2024-02-13 DIAGNOSIS — E785 Hyperlipidemia, unspecified: Secondary | ICD-10-CM | POA: Diagnosis not present

## 2024-02-13 DIAGNOSIS — I5033 Acute on chronic diastolic (congestive) heart failure: Secondary | ICD-10-CM | POA: Diagnosis not present

## 2024-02-13 DIAGNOSIS — I4819 Other persistent atrial fibrillation: Secondary | ICD-10-CM | POA: Diagnosis not present

## 2024-02-13 DIAGNOSIS — N1832 Chronic kidney disease, stage 3b: Secondary | ICD-10-CM | POA: Diagnosis not present

## 2024-02-13 DIAGNOSIS — Z7901 Long term (current) use of anticoagulants: Secondary | ICD-10-CM | POA: Diagnosis not present

## 2024-02-13 DIAGNOSIS — I5030 Unspecified diastolic (congestive) heart failure: Secondary | ICD-10-CM | POA: Diagnosis present

## 2024-02-13 DIAGNOSIS — Z79899 Other long term (current) drug therapy: Secondary | ICD-10-CM | POA: Insufficient documentation

## 2024-02-13 DIAGNOSIS — G4733 Obstructive sleep apnea (adult) (pediatric): Secondary | ICD-10-CM | POA: Diagnosis not present

## 2024-02-13 DIAGNOSIS — I959 Hypotension, unspecified: Secondary | ICD-10-CM | POA: Insufficient documentation

## 2024-02-13 DIAGNOSIS — F1910 Other psychoactive substance abuse, uncomplicated: Secondary | ICD-10-CM | POA: Diagnosis not present

## 2024-02-13 DIAGNOSIS — I480 Paroxysmal atrial fibrillation: Secondary | ICD-10-CM | POA: Diagnosis not present

## 2024-02-13 DIAGNOSIS — I110 Hypertensive heart disease with heart failure: Secondary | ICD-10-CM | POA: Insufficient documentation

## 2024-02-13 MED ORDER — AMIODARONE HCL 200 MG PO TABS
200.0000 mg | ORAL_TABLET | Freq: Every day | ORAL | 3 refills | Status: AC
  Filled 2024-02-13 – 2024-03-30 (×2): qty 90, 90d supply, fill #0
  Filled 2024-05-13 – 2024-05-15 (×2): qty 90, 90d supply, fill #1

## 2024-02-13 MED ORDER — METOPROLOL SUCCINATE ER 50 MG PO TB24
50.0000 mg | ORAL_TABLET | Freq: Every day | ORAL | 3 refills | Status: DC
  Filled 2024-02-13: qty 90, 90d supply, fill #0

## 2024-02-13 MED ORDER — TORSEMIDE 20 MG PO TABS
40.0000 mg | ORAL_TABLET | Freq: Two times a day (BID) | ORAL | 3 refills | Status: DC
  Filled 2024-02-13: qty 180, 45d supply, fill #0

## 2024-02-13 NOTE — Progress Notes (Signed)
 ADVANCED HEART FAILURE FOLLOW UP CLINIC NOTE  Referring Physician: Myrla Jon [name], MD  Primary Care: Myrla Jon [name], MD Primary Cardiologist:  HPI: Cameron Foster. is a 83 y.o. male who presents for follow up of chronic heart failure with preserved EF.      Admitted to [facility] 9/3-9/7 due to chest pain and tachycardia, found to have atrial fibrillation with RVR, rates in the 160s. Cardizem  given and metoprolol  given.    Echo (12/22/23) EF: 55-60%   Admitted to [facility] 9/15 due to chest pain with persistent tachycardia. EKG showed atrial fibrillation. He received IV lasix  and metrprolol. He was Cardioverter on 01/04/24 with symptom improvement.    TEE (01/03/24) EF: 30-35%, LV      SUBJECTIVE:  Patient overall feels pretty miserable today. He is short of breath with minimal exertion, has significant lower extremity swelling, and notes worsening BP over the past few days. He has been taking his medications as prescribed, though recently stopped jardiance after he noticed that his blood pressures dropped with taking. He is concerned about his overall trajectory, and notes that he currently has very little quality of life. We discussed the natural history of HF, the importance of fluid mobilization, and salt/fluid restrictions.   PMH, current medications, allergies, social history, and family history reviewed in epic.  PHYSICAL EXAM: Vitals:   02/13/24 1538  BP: (!) 95/59  Pulse: 69  SpO2: 94%   GENERAL: Chronically ill appearing PULM:  Normal work of breathing, clear to auscultation bilaterally. Respirations are unlabored.  CARDIAC:  JVP: moderately elevated         Normal rate with regular rhythm. Systolic murmur, 2+ LE edema.  Warm and well perfused extremities. ABDOMEN: Soft, non-tender, non-distended. NEUROLOGIC: Patient is oriented x3 with no focal or lateralizing neurologic deficits.     ASSESSMENT & PLAN:  1. Heart failure with preserved ejection fraction -  NYHA Class III  - Hypervolemic  - Symptoms of volume overload, concerned about his clinical trajectory -Ejection fraction reduced during TEE, but suspect mainly due to tachyarrhythmia.  Appears to behave more like diastolic heart failure - Transition Lasix  to 40 mg torsemide twice daily -May eventually need midodrine if blood pressure remains low -Restart Jardiance hopefully as blood pressure improves -Off losartan , spironolactone -Reduce metoprolol  to 50 mg daily - Has close general cardiology follow-up   2. Persistent atrial fibrillation  - Currently in normal sinus rhythm based on exam - continue eliquis  5mg  BID - Reduce amiodarone  to 200 mg daily     3. Substance abuse  - Alcohol and tobacco use cessation encouraged    4. Hyperlipidemia LDL goal <70 - LDL 12/21/23 was 68  - continue atorvastatin  40mg  daily    5. Hypertension - Hypotensive mainly now based on BP log, medicine reductions as above - saw PCP Lollie) 12/26/23     6. CKD stage 3b - Last creatinine 1.83, diuresis with lab check up as above   7. OSA (obstructive sleep apnea) - Advised to wear CPAP at night   Follow up in 3 weeks with general cardiology, heart failure APP around January.  Does not need to be seen in [place]  I spent 50 minutes caring for this patient today including face to face time, ordering and reviewing labs, reviewing records from his prior hospital visits, long discussion with patient about clinical trajectory, if worsening could consider palliative care consultation, seeing the patient, documenting in the record, and arranging follow ups.   Morene  Zenaida, MD Advanced Heart Failure Mechanical Circulatory Support 02/13/24

## 2024-02-13 NOTE — Patient Instructions (Signed)
 Medication Changes:  DECREASE Metoprolol  to 50mg  (1 tab) daily  DECREASE Amiodarone  to 200mg  daily  INCREASE Torsemide to 40mg  (2 tabs) two times daily   Follow-Up in: Please follow up with the Advanced Heart Failure Clinic in 2 months with Ellouise Class, FNP.   Thank you for choosing [facility].    At the Advanced Heart Failure Clinic, you and your health needs are our priority. We have a designated team specialized in the treatment of Heart Failure. This Care Team includes your primary Heart Failure Specialized Cardiologist (physician), Advanced Practice Providers (APPs- Physician Assistants and Nurse Practitioners), and Pharmacist who all work together to provide you with the care you need, when you need it.   You may see any of the following providers on your designated Care Team at your next follow up:  Dr. Toribio Fuel Dr. Ezra Shuck Dr. Ria Commander Dr. Morene Brownie Ellouise Class, FNP Jaun Bash, RPH-CPP  Please be sure to bring in all your medications bottles to every appointment.   Need to Contact Us :  If you have any questions or concerns before your next appointment please send us  a message through [place] or call our office at [phone].    TO LEAVE A MESSAGE FOR THE NURSE SELECT OPTION 2, PLEASE LEAVE A MESSAGE INCLUDING: YOUR NAME DATE OF BIRTH CALL BACK NUMBER REASON FOR CALL**this is important as we prioritize the call backs  YOU WILL RECEIVE A CALL BACK THE SAME DAY AS LONG AS YOU CALL BEFORE 4:00 PM

## 2024-02-13 NOTE — Telephone Encounter (Signed)
 Patient sister Loraine came by office Checking on status of message Patient is currently in an appointment with Dr Zenaida Please call [phone] or [phone] to discuss

## 2024-02-14 NOTE — Telephone Encounter (Signed)
 Pt's sister was made aware of the recommendations below and verbalized understanding. She also reported that the pt was seen by Dr. Zenaida yesterday, who recommended decreasing Metoprolol  to 50 mg by mouth daily and decreasing Amiodarone  to 200 mg daily.   Lorene Lesley CROME, PA-C to Me    02/13/24 12:26 PM I agree with stopping the Jardiance given low BP. Thanks.

## 2024-02-15 ENCOUNTER — Other Ambulatory Visit: Payer: Self-pay

## 2024-02-15 ENCOUNTER — Other Ambulatory Visit: Payer: Self-pay | Admitting: Family Medicine

## 2024-02-15 DIAGNOSIS — F320 Major depressive disorder, single episode, mild: Secondary | ICD-10-CM

## 2024-02-15 MED ORDER — ATORVASTATIN CALCIUM 40 MG PO TABS
40.0000 mg | ORAL_TABLET | Freq: Every day | ORAL | 1 refills | Status: AC
  Filled 2024-02-15: qty 90, 90d supply, fill #0
  Filled 2024-05-03 – 2024-05-13 (×2): qty 90, 90d supply, fill #1
  Filled 2024-05-15: qty 30, 30d supply, fill #1

## 2024-02-16 ENCOUNTER — Other Ambulatory Visit: Payer: Self-pay

## 2024-02-16 ENCOUNTER — Other Ambulatory Visit: Payer: Self-pay | Admitting: Family Medicine

## 2024-02-16 ENCOUNTER — Telehealth: Payer: Self-pay | Admitting: Family Medicine

## 2024-02-16 DIAGNOSIS — F320 Major depressive disorder, single episode, mild: Secondary | ICD-10-CM

## 2024-02-16 MED ORDER — SERTRALINE HCL 100 MG PO TABS
100.0000 mg | ORAL_TABLET | Freq: Every day | ORAL | 1 refills | Status: AC
  Filled 2024-02-16: qty 90, 90d supply, fill #0
  Filled 2024-05-03 – 2024-05-13 (×2): qty 90, 90d supply, fill #1
  Filled 2024-05-15: qty 30, 30d supply, fill #1
  Filled 2024-05-15: qty 90, 90d supply, fill #1
  Filled 2024-05-15: qty 30, 30d supply, fill #1

## 2024-02-16 NOTE — Telephone Encounter (Signed)
 [facility] faxed refill request for the following medications:  sertraline  (ZOLOFT ) 100 MG tablet    Please advise.

## 2024-02-19 ENCOUNTER — Telehealth: Payer: Self-pay

## 2024-02-19 NOTE — Telephone Encounter (Signed)
 That sounds very reasonable. Unfortunately seems like he is getting worse quickly. Would try to get him in soon with Ellouise, may need to come in for inpatient diuresis or discussion with palliative care.

## 2024-02-19 NOTE — Telephone Encounter (Signed)
 Patient's wife came by the clinic to discuss recent medication changes. She states after changing medications as discussed with Dr. Zenaida at appt last week, pt's blood pressure dropped less than 80 systolic and he could not get up. She feels the afternoon dose of torsemide was the main contributing factor.  She is now giving the pt his metoprolol  and amiodarone  at night and only doing 40 mg torsemide in the AM, with no PM dose. She states she will try to give one torsemide tablet in the afternoon again soon, and let us  know if he can tolerate it.   She wanted to make sure these changes were agreeable with Dr. Zenaida.  Will forward to Dr. Zenaida for review.

## 2024-02-19 NOTE — Telephone Encounter (Signed)
 Copied from CRM [id number]. Topic: Medical Record Request - Provider/Facility Request >> Feb 19, 2024 10:34 AM Ivette P wrote: Reason for CRM: joy called in to confirm that media was received from 10/30 per pt chart advised was received.   If any further questions please contact clinical team at   [phone] ext [phone]  [email or web address]

## 2024-02-20 ENCOUNTER — Telehealth: Payer: Self-pay

## 2024-02-20 NOTE — Telephone Encounter (Signed)
 Copied from CRM #[id number]. Topic: Clinical - Home Health Verbal Orders >> Feb 20, 2024 11:20 AM Leonette SQUIBB wrote: Caller/Agency: Mardeen gurney [facility]  Callback Number: [phone]  Service Requested: Occupational Therapy Frequency: 2 week 2 and 1 week 2 Any new concerns about the patient? No

## 2024-02-20 NOTE — Telephone Encounter (Signed)
Verbals given  

## 2024-02-20 NOTE — Telephone Encounter (Signed)
 OK for verbals

## 2024-02-23 ENCOUNTER — Other Ambulatory Visit: Payer: Self-pay

## 2024-02-23 MED ORDER — OMEPRAZOLE 20 MG PO CPDR
20.0000 mg | DELAYED_RELEASE_CAPSULE | Freq: Every day | ORAL | 1 refills | Status: DC | PRN
  Filled 2024-02-23: qty 90, 90d supply, fill #0

## 2024-02-26 NOTE — Telephone Encounter (Signed)
 Pt requested that I call his sister, [name]. Spoke with sister and reviewed Dr. Starla message. She verbalized understanding and was agreeable.   She agreed to move up his January appt. To 03/11/24 and states they also see primary cardiologist next week.

## 2024-02-28 ENCOUNTER — Ambulatory Visit: Payer: Self-pay

## 2024-02-28 NOTE — Telephone Encounter (Signed)
 FYI Only or Action Required?: FYI only for provider: ED advised.  Patient was last seen in primary care on 02/01/2024 by Myrla Jon [name], MD.  Called Nurse Triage reporting Dizziness.  Symptoms began today.  Interventions attempted: Prescription medications: blood pressure meds.  Symptoms are: unchanged.  Triage Disposition: Call EMS 911 Now  Patient/caregiver understands and will follow disposition?: Yes   Copied from CRM [id number]. Topic: Clinical - Red Word Triage >> Feb 28, 2024  2:24 PM Terri MATSU wrote: Red Word that prompted transfer to Nurse Triage: Elouise (PT home health aid) calling regarding patient is light headed.woozy, blood is reading 77/50 Reason for Disposition  [1] Systolic BP < 90 AND [2] feeling weak or lightheaded (e.g., woozy, feeling like they might faint)  Answer Assessment - Initial Assessment Questions Offered to call 911, pt and PT declined.  Physical therapist with patient and reports pt's sister will take to ED.  1. BLOOD PRESSURE: What is your blood pressure? Did you take at least two measurements 5 minutes apart?     Bp 90/55, 80/50 hr 69, 15 minutes ago 2. ONSET: When did you take your blood pressure?     15 minutes 3. HOW: How did you take your blood pressure? (e.g., visiting nurse, automatic home BP monitor)     Manual reading 4. HISTORY: Do you have a history of low blood pressure? What is your blood pressure normally?     htn 5. MEDICINES: Are you taking any medicines for blood pressure? If Yes, ask: Have they been changed recently?     Took 3 bp meds this morning, 0930 6. PULSE RATE: Do you know what your pulse rate is?      69 7. OTHER SYMPTOMS: Have you been sick recently? Have you had a recent injury?     Dizziness when standing Denies blurred vision, HA, faint, weakness/numbness  Protocols used: Blood Pressure - Low-A-AH

## 2024-02-29 ENCOUNTER — Ambulatory Visit: Admitting: Cardiology

## 2024-02-29 ENCOUNTER — Telehealth: Payer: Self-pay

## 2024-02-29 ENCOUNTER — Ambulatory Visit: Attending: Cardiology | Admitting: Cardiology

## 2024-02-29 ENCOUNTER — Other Ambulatory Visit: Payer: Self-pay

## 2024-02-29 ENCOUNTER — Encounter: Payer: Self-pay | Admitting: Emergency Medicine

## 2024-02-29 ENCOUNTER — Emergency Department

## 2024-02-29 ENCOUNTER — Inpatient Hospital Stay
Admission: EM | Admit: 2024-02-29 | Discharge: 2024-03-12 | DRG: 291 | Disposition: A | Discharge status: Home Health | Source: Ambulatory Visit | Attending: Internal Medicine | Admitting: Internal Medicine

## 2024-02-29 VITALS — BP 80/40 | HR 71 | Ht 69.0 in | Wt 216.0 lb

## 2024-02-29 DIAGNOSIS — D649 Anemia, unspecified: Secondary | ICD-10-CM | POA: Diagnosis present

## 2024-02-29 DIAGNOSIS — N189 Chronic kidney disease, unspecified: Secondary | ICD-10-CM | POA: Diagnosis not present

## 2024-02-29 DIAGNOSIS — I5023 Acute on chronic systolic (congestive) heart failure: Secondary | ICD-10-CM | POA: Diagnosis present

## 2024-02-29 DIAGNOSIS — Z7901 Long term (current) use of anticoagulants: Secondary | ICD-10-CM | POA: Diagnosis not present

## 2024-02-29 DIAGNOSIS — I5033 Acute on chronic diastolic (congestive) heart failure: Secondary | ICD-10-CM

## 2024-02-29 DIAGNOSIS — Z801 Family history of malignant neoplasm of trachea, bronchus and lung: Secondary | ICD-10-CM

## 2024-02-29 DIAGNOSIS — I5032 Chronic diastolic (congestive) heart failure: Secondary | ICD-10-CM | POA: Diagnosis not present

## 2024-02-29 DIAGNOSIS — Z8673 Personal history of transient ischemic attack (TIA), and cerebral infarction without residual deficits: Secondary | ICD-10-CM

## 2024-02-29 DIAGNOSIS — Z5181 Encounter for therapeutic drug level monitoring: Secondary | ICD-10-CM | POA: Diagnosis not present

## 2024-02-29 DIAGNOSIS — I959 Hypotension, unspecified: Secondary | ICD-10-CM | POA: Diagnosis not present

## 2024-02-29 DIAGNOSIS — N1831 Chronic kidney disease, stage 3a: Secondary | ICD-10-CM | POA: Diagnosis not present

## 2024-02-29 DIAGNOSIS — I9589 Other hypotension: Secondary | ICD-10-CM | POA: Diagnosis not present

## 2024-02-29 DIAGNOSIS — N179 Acute kidney failure, unspecified: Secondary | ICD-10-CM

## 2024-02-29 DIAGNOSIS — F1010 Alcohol abuse, uncomplicated: Secondary | ICD-10-CM | POA: Diagnosis present

## 2024-02-29 DIAGNOSIS — Z8546 Personal history of malignant neoplasm of prostate: Secondary | ICD-10-CM

## 2024-02-29 DIAGNOSIS — N401 Enlarged prostate with lower urinary tract symptoms: Secondary | ICD-10-CM | POA: Diagnosis present

## 2024-02-29 DIAGNOSIS — Z79899 Other long term (current) drug therapy: Secondary | ICD-10-CM

## 2024-02-29 DIAGNOSIS — I480 Paroxysmal atrial fibrillation: Secondary | ICD-10-CM | POA: Diagnosis present

## 2024-02-29 DIAGNOSIS — G4733 Obstructive sleep apnea (adult) (pediatric): Secondary | ICD-10-CM | POA: Diagnosis present

## 2024-02-29 DIAGNOSIS — N1832 Chronic kidney disease, stage 3b: Secondary | ICD-10-CM | POA: Diagnosis present

## 2024-02-29 DIAGNOSIS — I2510 Atherosclerotic heart disease of native coronary artery without angina pectoris: Secondary | ICD-10-CM | POA: Diagnosis present

## 2024-02-29 DIAGNOSIS — E669 Obesity, unspecified: Secondary | ICD-10-CM | POA: Diagnosis not present

## 2024-02-29 DIAGNOSIS — D500 Iron deficiency anemia secondary to blood loss (chronic): Secondary | ICD-10-CM | POA: Diagnosis not present

## 2024-02-29 DIAGNOSIS — D6869 Other thrombophilia: Secondary | ICD-10-CM | POA: Diagnosis not present

## 2024-02-29 DIAGNOSIS — D638 Anemia in other chronic diseases classified elsewhere: Secondary | ICD-10-CM | POA: Diagnosis present

## 2024-02-29 DIAGNOSIS — I5020 Unspecified systolic (congestive) heart failure: Secondary | ICD-10-CM

## 2024-02-29 DIAGNOSIS — E876 Hypokalemia: Secondary | ICD-10-CM | POA: Diagnosis present

## 2024-02-29 DIAGNOSIS — I428 Other cardiomyopathies: Secondary | ICD-10-CM | POA: Diagnosis present

## 2024-02-29 DIAGNOSIS — I252 Old myocardial infarction: Secondary | ICD-10-CM

## 2024-02-29 DIAGNOSIS — Z96641 Presence of right artificial hip joint: Secondary | ICD-10-CM | POA: Diagnosis present

## 2024-02-29 DIAGNOSIS — I130 Hypertensive heart and chronic kidney disease with heart failure and stage 1 through stage 4 chronic kidney disease, or unspecified chronic kidney disease: Secondary | ICD-10-CM | POA: Diagnosis present

## 2024-02-29 DIAGNOSIS — F419 Anxiety disorder, unspecified: Secondary | ICD-10-CM | POA: Diagnosis present

## 2024-02-29 DIAGNOSIS — I10 Essential (primary) hypertension: Secondary | ICD-10-CM | POA: Diagnosis present

## 2024-02-29 DIAGNOSIS — I472 Ventricular tachycardia, unspecified: Secondary | ICD-10-CM | POA: Diagnosis not present

## 2024-02-29 DIAGNOSIS — I4819 Other persistent atrial fibrillation: Secondary | ICD-10-CM | POA: Diagnosis present

## 2024-02-29 DIAGNOSIS — I509 Heart failure, unspecified: Secondary | ICD-10-CM | POA: Diagnosis not present

## 2024-02-29 DIAGNOSIS — Z85828 Personal history of other malignant neoplasm of skin: Secondary | ICD-10-CM | POA: Diagnosis not present

## 2024-02-29 DIAGNOSIS — I951 Orthostatic hypotension: Secondary | ICD-10-CM | POA: Diagnosis present

## 2024-02-29 DIAGNOSIS — Z7984 Long term (current) use of oral hypoglycemic drugs: Secondary | ICD-10-CM | POA: Diagnosis not present

## 2024-02-29 DIAGNOSIS — I3139 Other pericardial effusion (noninflammatory): Secondary | ICD-10-CM | POA: Diagnosis present

## 2024-02-29 DIAGNOSIS — Z6831 Body mass index (BMI) 31.0-31.9, adult: Secondary | ICD-10-CM

## 2024-02-29 DIAGNOSIS — E785 Hyperlipidemia, unspecified: Secondary | ICD-10-CM | POA: Diagnosis present

## 2024-02-29 DIAGNOSIS — D631 Anemia in chronic kidney disease: Secondary | ICD-10-CM | POA: Diagnosis present

## 2024-02-29 DIAGNOSIS — Z85038 Personal history of other malignant neoplasm of large intestine: Secondary | ICD-10-CM

## 2024-02-29 DIAGNOSIS — R5383 Other fatigue: Secondary | ICD-10-CM | POA: Diagnosis present

## 2024-02-29 DIAGNOSIS — Z96651 Presence of right artificial knee joint: Secondary | ICD-10-CM | POA: Diagnosis present

## 2024-02-29 DIAGNOSIS — Z8719 Personal history of other diseases of the digestive system: Secondary | ICD-10-CM

## 2024-02-29 DIAGNOSIS — F17220 Nicotine dependence, chewing tobacco, uncomplicated: Secondary | ICD-10-CM | POA: Diagnosis present

## 2024-02-29 DIAGNOSIS — I452 Bifascicular block: Secondary | ICD-10-CM | POA: Diagnosis present

## 2024-02-29 DIAGNOSIS — Z821 Family history of blindness and visual loss: Secondary | ICD-10-CM

## 2024-02-29 DIAGNOSIS — Z923 Personal history of irradiation: Secondary | ICD-10-CM

## 2024-02-29 DIAGNOSIS — F418 Other specified anxiety disorders: Secondary | ICD-10-CM | POA: Diagnosis present

## 2024-02-29 DIAGNOSIS — E66811 Obesity, class 1: Secondary | ICD-10-CM | POA: Diagnosis present

## 2024-02-29 DIAGNOSIS — I440 Atrioventricular block, first degree: Secondary | ICD-10-CM | POA: Diagnosis present

## 2024-02-29 DIAGNOSIS — K219 Gastro-esophageal reflux disease without esophagitis: Secondary | ICD-10-CM | POA: Diagnosis present

## 2024-02-29 DIAGNOSIS — I25118 Atherosclerotic heart disease of native coronary artery with other forms of angina pectoris: Secondary | ICD-10-CM | POA: Diagnosis not present

## 2024-02-29 LAB — CBC
HCT: 28.1 % — ABNORMAL LOW (ref 39.0–52.0)
HCT: 28.7 % — ABNORMAL LOW (ref 39.0–52.0)
Hemoglobin: 8.5 g/dL — ABNORMAL LOW (ref 13.0–17.0)
Hemoglobin: 8.7 g/dL — ABNORMAL LOW (ref 13.0–17.0)
MCH: 28 pg (ref 26.0–34.0)
MCH: 27.4 pg (ref 26.0–34.0)
MCHC: 30.2 g/dL (ref 30.0–36.0)
MCHC: 30.3 g/dL (ref 30.0–36.0)
MCV: 92.4 fL (ref 80.0–100.0)
MCV: 90.5 fL (ref 80.0–100.0)
Platelets: 249 K/uL (ref 150–400)
Platelets: 224 K/uL (ref 150–400)
RBC: 3.04 MIL/uL — ABNORMAL LOW (ref 4.22–5.81)
RBC: 3.17 MIL/uL — ABNORMAL LOW (ref 4.22–5.81)
RDW: 14.9 % (ref 11.5–15.5)
RDW: 14.8 % (ref 11.5–15.5)
WBC: 7.3 K/uL (ref 4.0–10.5)
WBC: 7.2 K/uL (ref 4.0–10.5)
nRBC: 0 % (ref 0.0–0.2)
nRBC: 0 % (ref 0.0–0.2)

## 2024-02-29 LAB — BASIC METABOLIC PANEL WITH GFR
Anion gap: 9 (ref 5–15)
BUN: 24 mg/dL — ABNORMAL HIGH (ref 8–23)
CO2: 28 mmol/L (ref 22–32)
Calcium: 9 mg/dL (ref 8.9–10.3)
Chloride: 100 mmol/L (ref 98–111)
Creatinine, Ser: 2.37 mg/dL — ABNORMAL HIGH (ref 0.61–1.24)
GFR, Estimated: 27 mL/min — ABNORMAL LOW (ref >60)
Glucose, Bld: 132 mg/dL — ABNORMAL HIGH (ref 70–99)
Potassium: 4.5 mmol/L (ref 3.5–5.1)
Sodium: 137 mmol/L (ref 135–145)

## 2024-02-29 LAB — IRON AND TIBC
Iron: 22 ug/dL — ABNORMAL LOW (ref 45–182)
Saturation Ratios: 7 % — ABNORMAL LOW (ref 17.9–39.5)
TIBC: 293 ug/dL (ref 250–450)
UIBC: 271 ug/dL

## 2024-02-29 LAB — RETICULOCYTES
Immature Retic Fract: 13.8 % (ref 2.3–15.9)
RBC.: 3.01 MIL/uL — ABNORMAL LOW (ref 4.22–5.81)
Retic Count, Absolute: 46.4 K/uL (ref 19.0–186.0)
Retic Ct Pct: 1.5 % (ref 0.4–3.1)

## 2024-02-29 LAB — CBG MONITORING, ED: Glucose-Capillary: 123 mg/dL — ABNORMAL HIGH (ref 70–99)

## 2024-02-29 LAB — PRO BRAIN NATRIURETIC PEPTIDE: Pro Brain Natriuretic Peptide: 1845 pg/mL — ABNORMAL HIGH (ref <300.0)

## 2024-02-29 LAB — FERRITIN: Ferritin: 379 ng/mL — ABNORMAL HIGH (ref 24–336)

## 2024-02-29 LAB — MAGNESIUM: Magnesium: 2.2 mg/dL (ref 1.7–2.4)

## 2024-02-29 LAB — FOLATE: Folate: >20 ng/mL (ref >5.9)

## 2024-02-29 MED ORDER — ACETAMINOPHEN 325 MG PO TABS: 650.0000 mg | ORAL_TABLET | Freq: Four times a day (QID) | ORAL | Status: DC | PRN

## 2024-02-29 MED ORDER — ONDANSETRON HCL 4 MG/2ML IJ SOLN
4.0000 mg | Freq: Three times a day (TID) | INTRAMUSCULAR | Status: DC | PRN
Start: 2024-02-29 — End: 2024-03-12

## 2024-02-29 MED ORDER — DM-GUAIFENESIN ER 30-600 MG PO TB12: 1.0000 | ORAL_TABLET | Freq: Two times a day (BID) | ORAL | Status: DC | PRN

## 2024-02-29 MED ORDER — HYDRALAZINE HCL 20 MG/ML IJ SOLN: 5.0000 mg | INTRAMUSCULAR | Status: DC | PRN

## 2024-02-29 MED ORDER — FUROSEMIDE 10 MG/ML IJ SOLN
60.0000 mg | Freq: Once | INTRAMUSCULAR | Status: AC
  Administered 2024-02-29: 60 mg via INTRAVENOUS
  Filled 2024-02-29: qty 8

## 2024-02-29 MED ORDER — ALBUTEROL SULFATE (2.5 MG/3ML) 0.083% IN NEBU: 2.5000 mg | INHALATION_SOLUTION | RESPIRATORY_TRACT | Status: DC | PRN

## 2024-02-29 MED ORDER — INSULIN ASPART 100 UNIT/ML IJ SOLN: 0.0000 [IU] | Freq: Every day | INTRAMUSCULAR | Status: DC

## 2024-02-29 MED ORDER — INSULIN ASPART 100 UNIT/ML IJ SOLN: 0.0000 [IU] | Freq: Three times a day (TID) | INTRAMUSCULAR | Status: DC

## 2024-02-29 NOTE — Telephone Encounter (Signed)
 Pt has been having low bp's. They have tried moving the metoprolol  50mg  to night with no improvement. The sister states his bp is mostly around 90/50. They have an EP appt today. Advised to keep appt. Any other advice?

## 2024-02-29 NOTE — ED Provider Notes (Signed)
 [facility] Provider Note    Event Date/Time   First MD Initiated Contact with Patient 02/29/24 1837     (approximate)   History   Leg Swelling   HPI  Cameron Foster. is a 83 y.o. male who presents to the emergency department today from cardiologist clinic because of concerns for heart failure.  He does have history of heart failure.  Apparently recently was taken off of his Lasix  and put on torsemide.  There is concern that this is not effective for him.  Patient states that he has noticed worsening swelling over the past couple of weeks.  [place] it been associated with some shortness of breath with exertion.  Patient denies any chest pain.     Physical Exam   Triage Vital Signs: ED Triage Vitals  Encounter Vitals Group     BP 02/29/24 1440 100/67     Girls Systolic BP Percentile --      Girls Diastolic BP Percentile --      Boys Systolic BP Percentile --      Boys Diastolic BP Percentile --      Pulse Rate 02/29/24 1440 72     Resp 02/29/24 1440 18     Temp 02/29/24 1440 98.6 F (37 C)     Temp Source 02/29/24 1440 Oral     SpO2 02/29/24 1440 97 %     Weight 02/29/24 1437 209 lb (94.8 kg)     Height --      Head Circumference --      Peak Flow --      Pain Score 02/29/24 1437 0     Pain Loc --      Pain Education --      Exclude from Growth Chart --     Most recent vital signs: Vitals:   02/29/24 1638 02/29/24 1819  BP: 133/87 (!) 139/92  Pulse: 69   Resp: 16   Temp: 98 F (36.7 C)   SpO2: 96%    General: Awake, alert, oriented. CV:  Good peripheral perfusion. Regular rate and rhythm. Resp:  Normal effort. Lungs clear. Abd:  No distention.  Other:  Bilateral lower extremity edema.    ED Results / Procedures / Treatments   Labs (all labs ordered are listed, but only abnormal results are displayed) Labs Reviewed  BASIC METABOLIC PANEL WITH GFR - Abnormal; Notable for the following components:      Result Value   Glucose,  Bld 132 (*)    BUN 24 (*)    Creatinine, Ser 2.37 (*)    GFR, Estimated 27 (*)    All other components within normal limits  CBC - Abnormal; Notable for the following components:   RBC 3.04 (*)    Hemoglobin 8.5 (*)    HCT 28.1 (*)    All other components within normal limits  PRO BRAIN NATRIURETIC PEPTIDE - Abnormal; Notable for the following components:   Pro Brain Natriuretic Peptide 1,845.0 (*)    All other components within normal limits  VITAMIN B12  FOLATE  IRON AND TIBC  FERRITIN  RETICULOCYTES     EKG  I, Guadalupe Eagles, attending physician, personally viewed and interpreted this EKG  EKG Time: 1443 Rate: 71 Rhythm: sinus rhythm with 1st degree av block Axis: left axis deviation Intervals: qtc 445 QRS: RBBB ST changes: no st elevation Impression: abnormal ekg  RADIOLOGY I independently interpreted and visualized the CXR. My interpretation: No pneumonia Radiology interpretation:  IMPRESSION:  No active cardiopulmonary disease.      PROCEDURES:  Critical Care performed: No   MEDICATIONS ORDERED IN ED: Medications  albuterol  (PROVENTIL ) (2.5 MG/3ML) 0.083% nebulizer solution 2.5 mg (has no administration in time range)  dextromethorphan -guaiFENesin  (MUCINEX  DM) 30-600 MG per 12 hr tablet 1 tablet (has no administration in time range)  ondansetron  (ZOFRAN ) injection 4 mg (has no administration in time range)  hydrALAZINE  (APRESOLINE ) injection 5 mg (has no administration in time range)  acetaminophen  (TYLENOL ) tablet 650 mg (has no administration in time range)  insulin aspart (novoLOG) injection 0-5 Units (has no administration in time range)  insulin aspart (novoLOG) injection 0-9 Units (has no administration in time range)  furosemide  (LASIX ) injection 60 mg (60 mg Intravenous Given 02/29/24 1848)     IMPRESSION / MDM / ASSESSMENT AND PLAN / ED COURSE  I reviewed the triage vital signs and the nursing notes.                               Differential diagnosis includes, but is not limited to, CHF, copd, pneumonia  Patient's presentation is most consistent with acute presentation with potential threat to life or bodily function.   Patient presented to the emergency department today from cardiology clinic because of concerns for fluid overload.  On exam patient does have bilateral lower extremity edema.  BNP is elevated here.  Will start IV Lasix .  Discussed with Dr. Hilma with the hospitalist service who will evaluate for admission.     FINAL CLINICAL IMPRESSION(S) / ED DIAGNOSES   Final diagnoses:  Congestive heart failure, unspecified HF chronicity, unspecified heart failure type [facility])      Note:  This document was prepared using Dragon voice recognition software and may include unintentional dictation errors.    Floy Roberts, MD 02/29/24 [phone]

## 2024-02-29 NOTE — Progress Notes (Signed)
 Electrophysiology Clinic Note    Date:  02/29/2024  Patient ID:  Cameron Foster., DOB Apr 11, 1941, MRN [id number] PCP:  Myrla Jon [name], MD  Cardiologist:  Lonni Hanson, MD  Cardiology APP:  Lorene Lesley CROME, PA-C  Electrophysiology APP:  Johnney Scarlata, NP     Discussed the use of AI scribe software for clinical note transcription with the patient, who gave verbal consent to proceed.   Patient Profile    Chief Complaint: AFib  History of Present Illness: Cameron Foster. is a 83 y.o. male with PMH notable for parox AFib, HFrEF, HTN, HLD, OSA, CVA, SAH, CKD-3a, tobacco use, colitis w GI bleeding, prostate Ca, ETOH use; seen today for EP evaluation of AFib and Watchman.   Historically AFib was diagnosed iso ischemic colitis in 2023. He had no recurrence of AFib, so eliquis  was stopped and on low dose ASA.   He presented to [facility] ER 01/01/2024 in AFib w RFR. TTE at that time with normal LVEF 55-60%. He was discharged at that time with eliquis  BID with plans for outpatient DCCV. He was re-hospitalized 12/2023 with dull chest pain, found to be in AFib w RVR and underwent TEE/DCCV and was started on oral amiodarone  load. TEE showed LVEF 30-35% with global hypokinesis.  He was seen in HF clinic for hosp follow-up where he was back in AFib, fluid overloaded. He is s/p repeat DCCV 10/1.  He has continued to follow-up with HF clinic and general cardiology with ongoing diuretic and GDMT adjustments, limited by hypotension. He last saw Dr. Zenaida 10/28, he had stopped jardiance d/t hypotension, diuretic adjusted to torsemide 40mg  BID. Off losartan  and spiro d/t hypotension, metop reduced to 50mg  daily.   On follow-up today, he continues to feel quite poorly. He has not been able to take torsemide BID d/t low BP for fear that his BP would drop lower. He is routinely getting BP readings 80-low 100s, very rarely 110s.  He is not having much UOP with torsemide.  His sister joins for visit  who is very concerned about his ongoing low BP. She helps manage his medications, and has been intermittently holding metoprolol  and amiodarone  for fear they would further reduce his BP  His legs are very edematous, he also c/o abd fullness.  He continues to take eliquis  BID, no missed doses, no bleeding concerns.      Arrhythmia/Device History No specialty comments available.    ROS:  Please see the history of present illness. All other systems are reviewed and otherwise negative.    Physical Exam    VS:  BP (!) 80/40 (BP Location: Left Arm, Patient Position: Sitting, Cuff Size: Normal)   Pulse 71   Ht 5' 9 (1.753 m)   Wt 216 lb (98 kg)   SpO2 94%   BMI 31.90 kg/m  BMI: Body mass index is 31.9 kg/m.           Wt Readings from Last 3 Encounters:  02/29/24 209 lb (94.8 kg)  02/29/24 216 lb (98 kg)  02/13/24 217 lb (98.4 kg)     GEN- The patient is ill- appearing, alert and oriented x 3 today.   Lungs- Diminished throughout, normal work of breathing.  Heart- Regular rate and rhythm, no murmurs, rubs or gallops Extremities- 3-4+ peripheral edema, warm, dry   Studies Reviewed   Previous EP, cardiology notes.    EKG is ordered. Personal review of EKG from today shows:  EKG Interpretation Date/Time:  Thursday February 29 2024 13:49:48 EST Ventricular Rate:  71 PR Interval:    QRS Duration:  134 QT Interval:  444 QTC Calculation: 482 R Axis:   -60  Text Interpretation:   Normal sinus rhythm with 1st degree A-V block  Left axis deviation Right bundle branch block Confirmed by Makaylynn Bonillas [phone]) on 02/29/2024 1:53:43 PM    TEE, 01/03/2024  1. Left ventricular ejection fraction, by estimation, is 30 to 35%. The left ventricle has moderately decreased function. The left ventricle demonstrates global hypokinesis.   2. Right ventricular systolic function is normal. The right ventricular size is normal.   3. Left atrial size was severely dilated. No left  atrial/left atrial appendage thrombus was detected.   4. The mitral valve is normal in structure. Mild mitral valve regurgitation. No evidence of mitral stenosis.   5. The aortic valve is tricuspid. Aortic valve regurgitation is not visualized. No aortic stenosis is present.   6. There is mild (Grade II) atheroma plaque involving the aortic arch and  descending aorta.   7. The inferior vena cava is normal in size with greater than 50% respiratory variability, suggesting right atrial pressure of 3 mmHg.   8. Agitated saline contrast bubble study was positive with shunting observed within 3-6 cardiac cycles suggestive of interatrial shunt. There is a small patent foramen ovale with predominantly left to right shunting across the atrial septum.   Cardioversion for atrial fibrillation with RVR to follow: normal sinus rhythm restored   TTE, 12/22/2023  1. Left ventricular ejection fraction, by estimation, is 55 to 60%. Left ventricular ejection fraction by PLAX is 66 %. The left ventricle has normal function. The left ventricle has no regional wall motion abnormalities. There is mild left ventricular hypertrophy. Left ventricular diastolic parameters are indeterminate.   2. Right ventricular systolic function is normal. The right ventricular size is normal.   3. The mitral valve is normal in structure. No evidence of mitral valve regurgitation.   4. The aortic valve was not well visualized. Aortic valve regurgitation is not visualized.   TTE, 05/07/2022  1. Left ventricular ejection fraction, by estimation, is 55 to 60%. The left ventricle has normal function. The left ventricle has no regional wall motion abnormalities. Left ventricular diastolic parameters are consistent with Grade II diastolic dysfunction (pseudonormalization). The average left ventricular global longitudinal strain is -19.4 %. The global longitudinal strain is normal.   2. Right ventricular systolic function is normal. The right  ventricular size is normal.   3. The mitral valve is normal in structure. No evidence of mitral valve regurgitation.   4. The aortic valve is tricuspid. Aortic valve regurgitation is trivial.    Assessment and Plan     #) persis AFib #) amiodarone  montoring He is maintaining sinus rhythm on amiodarone  Continue 200mg  daily Recent LFT, thyroid  labs stable   #) Hypercoag d/t parox afib #) h/o colitis c/b GI bleed CHA2DS2-VASc Score = at least 7 [CHF History: 1, HTN History: 1, Diabetes History: 0, Stroke History: 2, Vascular Disease History: 1, Age Score: 2, Gender Score: 0].  Therefore, the patient's annual risk of stroke is 11.2 %.    Has-Bled score = 6, indicating 12.5% yearly major bleeding risk  Stroke ppx - 5mg  eliquis . Meets criteria for dose-reduction based on age, Cr Reduce eliquis  to 2.5mg  BID No bleeding concerns at this time He is not appropriate watchman candidate at this time, will reassess once euvolemic.  #) HFrEF #)  anasarca  #) hypotension He is profoundly fluid overloaded today in clinic, unresponsive to recent switch from lasix  > torsemide by HF MD. He appears to be in low-output HF He is not taking torsemide in evening for fear of further reducing BP He is off all anti-HTN medications, intermittently taking metoprolol  I recommended he proceed to ER for IV diuresis, possibly needing inotrope assisted diuresis Patient and sister are agreeable, will have nursing staff transport him        Current medicines are reviewed at length with the patient today.   The patient has concerns regarding his medicines.  The following changes were made today:   REDUCE eliquis  to 2.5mg  BID  Labs/ tests ordered today include:  Orders Placed This Encounter  Procedures   EKG 12-Lead     Disposition: Follow up with Dr. Kennyth or EP APP in 6 months  Proceed to ER for further evaluation and treatment   Signed, Chantal Needle, NP  02/29/24  4:01 PM   Electrophysiology [facility]

## 2024-02-29 NOTE — Patient Instructions (Signed)
 Patient escorted to ED per Suzann Riddle, NP.  Patient to follow up with EP in 6 months.

## 2024-02-29 NOTE — H&P (Signed)
 History and Physical    Cameron Foster. FMW:[id number] DOB: 05/30/1940 DOA: 02/29/2024  Referring MD/NP/PA:   PCP: Cameron Jon [name], MD   Patient coming from:  The patient is coming from home.     Chief Complaint: Anasarca and low blood pressure  HPI: Cameron Foster. is a 83 y.o. male with medical history significant of CHF with EF 30 to 35%, stroke, depression with anxiety, hypertension, hyperlipidemia, prediabetes, CAD, CKD-3A, A-fib on Eliquis , right bundle blockade, IBS, obesity, alcohol use, who presents with anasarca and low blood pressure.  Patient states that he has worsening leg edema and also has abdominal swelling which has been going on for more than 3 weeks.  Patient has cough with clear mucus production.  Has SOB on exertion.  No chest pain, fever or chills.  Patient does not have nausea, vomiting, diarrhea or abdominal pain.  No symptoms of UTI.  He states that he stopped drinking alcohol 3 weeks ago.  Patient denies rectal bleeding or dark stool.  Patient was seen in cardiologist office today, and found to have anasarca and low blood pressure 80/60.  Patient was sent to ED for further evaluation and treatment.  Data reviewed independently and ED Course: pt was found to have proBNP 1845, worsening renal function, hemoglobin dropped from 10.2 on 01/04/24 to 8.5 today.  Temperature normal, blood pressure 80/40 --> 139/92 in ED, heart rate 72, RR 18, oxygen saturation 96% on room air.  Chest x-ray negative.  Patient is admitted to PCU as inpatient.   EKG: I have personally reviewed.  Sinus rhythm, QTc 482, bifascicular block   Review of Systems:   General: no fevers, chills, no body weight gain, has fatigue HEENT: no blurry vision, hearing changes or sore throat Respiratory: has dyspnea, coughing, no wheezing CV: no chest pain, no palpitations GI: no nausea, vomiting, abdominal pain, diarrhea, constipation GU: no dysuria, burning on urination, increased urinary  frequency, hematuria  Ext: has leg edema Neuro: no unilateral weakness, numbness, or tingling, no vision change or hearing loss Skin: no rash, no skin tear. MSK: No muscle spasm, no deformity, no limitation of range of movement in spin Heme: No easy bruising.  Travel history: No recent long distant travel.   Allergy:  Allergies  Allergen Reactions   Amoxicillin -Pot Clavulanate Diarrhea and Other (See Comments)    Abdominal upset  amoxicillin  / clavulanate   Formaldehyde Rash    From shoes made with this material    Past Medical History:  Diagnosis Date   Alcohol dependence (HCC) 09/30/2021   Alcoholism (HCC)    Allergy    Aortic atherosclerosis    Arthritis    Ascending aorta dilation 03/28/2018   a.) Vascular US : prox asc Ao measured 29 mm. b.)  CT CAP 06/22/2021: Ao root 41 mm. c.) TTE 06/26/2021: Ao root 41 mm; asc Ao 39 mm   Atrial fibrillation (HCC) 1995   a.) single episode in 1995 per patient; no long term treatment. b.) recurrent episode in the setting of GI bleeding related to colitis 06/2021.   Basal cell carcinoma 04/26/2017   Right medial cheek. Superficial and nodular   Basal cell carcinoma 06/11/2019   Left anterior shoulder. Nodular pattern   Basal cell carcinoma 09/09/2019   Right nasal ala, [facility]   Basal cell carcinoma 02/05/2020   L upper eyebrow, [facility]    Basal cell carcinoma 10/12/2022   left post auricular, [facility]   Basal cell carcinoma 05/02/2023   Right medial  cheek, [facility]   Bilateral carotid artery stenosis 06/09/2019   a.) Carotid doppler: mod; <50% BILATERAL ICAs.   Cataract    Chicken pox    Colon cancer (HCC)    Colon polyp    Depression    Son died 03/17/15   Diverticulitis    Diverticulosis 30 years   Encounter for other preprocedural examination 06/21/2021   Gastritis    GERD (gastroesophageal reflux disease)    H. pylori infection    History of stress test    a. 09/2015 MV: No ischemia/infarct. EF 45-54% (nl by echo).   Hyperlipidemia     Hypertension    Irritable bowel syndrome    Lacunar infarction (HCC) 06/08/2019   a.) small; RIGHT motor strip   Myocardial infarction [facility])    Neuromuscular disorder (HCC)    NSVT (nonsustained ventricular tachycardia) (HCC)    a.)  Single episode lasting 5 beats at a maximum rate of 160 bpm noted on Holter study performed 08/13/2021.   Prostate cancer (HCC) Mar 17, 2011   a.) s/p XRT   PSVT (paroxysmal supraventricular tachycardia)    a. 06/2019 Zio: Avg rate 74 (54-120), occas PACs, rare PVCs, 125 episodes of PVCs (longest 17.5 secs; max rate 187). No afib.   RBBB    SAH (subarachnoid hemorrhage) (HCC) 10/10/2018   Sleep apnea    Squamous cell carcinoma of skin 07/18/2017   Left medial calf. KA type   Squamous cell carcinoma of skin 04/24/2018   Right above med. brow   Squamous cell carcinoma of skin 06/11/2019   Right posterior shoulder. SCCis, hypertrophic   Squamous cell carcinoma of skin 01/09/2020   Mid nasal dorsum, MOHS, Efudex x 4wks   Stroke [facility])    Substance abuse (HCC)    Systolic dysfunction    a.) TTE 10/05/2015: EF 50-55%; mild LVH; LAE; triv AR, mild MR. b.) TTE 03/29/2018: EF 55-60%; LAE, mild AR; ? small PFO. c.) TTE 06/09/2019: EF 55-60%, no rwma, triv MR/AI. d.) TTE 06/26/2021: EF 45-50%; glob HK; LAE; mild MR; Ao root 41 mm; asc Ao 39 mm.   Vasovagal syncope     Past Surgical History:  Procedure Laterality Date   CARDIAC CATHETERIZATION  1997/03/16   [place] no stents   CARDIOVERSION N/A 01/03/2024   Procedure: CARDIOVERSION;  Surgeon: Cameron Evalene PARAS, MD;  Location: [facility] ORS;  Service: Cardiovascular;  Laterality: N/A;   CARDIOVERSION N/A 01/17/2024   Procedure: CARDIOVERSION;  Surgeon: Cameron Morene PARAS, MD;  Location: [facility];  Service: Cardiovascular;  Laterality: N/A;   CATARACT EXTRACTION, BILATERAL     COLON RESECTION SIGMOID N/A 12/07/2016   Procedure: COLON RESECTION SIGMOID;  Surgeon: Cameron Dunnings, MD;  Location: [facility];  Service: General;   Laterality: N/A;   COLON SURGERY  11/2016   Colostomy   COLONOSCOPY  03-17-15   COLONOSCOPY WITH PROPOFOL  N/A 05/30/2016   Procedure: COLONOSCOPY WITH PROPOFOL ;  Surgeon: Gladis RAYMOND Mariner, MD;  Location[facility] END[facility]COPY;  Service: Endoscopy;  Laterality: N/A;   COLOSTOMY Left 12/07/2016   Procedure: COLOSTOMY;  Surgeon: Cameron Dunnings, MD;  Location: [facility] OR[facility];  Service: General;  Laterality: Left;   COLOSTOMY REVERSAL N/A 03/21/2017   Procedure: COLOSTOMY REVERSAL;  Surgeon: Cameron Dunnings, MD;  Location: [facility];  Service: General;  Laterality: N/A;   COLOSTOMY TAKEDOWN N/A 03/21/2017   Procedure: LAPAROSCOPIC COLOSTOMY TAKEDOWN;  Surgeon: Cameron Dunnings, MD;  Location: [facility];  Service: General;  Laterality: N/A;   CYSTOSCOPY WITH STENT PLACEMENT Bilateral 03/21/2017  Procedure: CYSTOSCOPY WITH LIGHTED STENT PLACEMENT;  Surgeon: Twylla Glendia BROCKS, MD;  Location: [facility];  Service: Urology;  Laterality: Bilateral;   ESOPHAGOGASTRODUODENOSCOPY     ESOPHAGOGASTRODUODENOSCOPY N/A 05/30/2016   Procedure: ESOPHAGOGASTRODUODENOSCOPY (EGD);  Surgeon: Gladis RAYMOND Mariner, MD;  Location: [facility] ENDOSCOPY;  Service: Endoscopy;  Laterality: N/A;   ESOPHAGOGASTRODUODENOSCOPY N/A 01/14/2021   Procedure: ESOPHAGOGASTRODUODENOSCOPY (EGD);  Surgeon: Toledo, Ladell POUR, MD;  Location: [facility] ENDOSCOPY;  Service: Gastroenterology;  Laterality: N/A;   ESOPHAGOGASTRODUODENOSCOPY (EGD) WITH PROPOFOL  N/A 04/28/2021   Procedure: ESOPHAGOGASTRODUODENOSCOPY (EGD) WITH PROPOFOL ;  Surgeon: Toledo, Ladell POUR, MD;  Location: [facility] ENDOSCOPY;  Service: Gastroenterology;  Laterality: N/A;   EYE SURGERY     cataracts   FLEXIBLE SIGMOIDOSCOPY N/A 03/21/2017   Procedure: FLEXIBLE SIGMOIDOSCOPY;  Surgeon: Cameron Dunnings, MD;  Location: [facility];  Service: General;  Laterality: N/A;   FLEXIBLE SIGMOIDOSCOPY N/A 06/24/2021   Procedure: ENID MORIN;  Surgeon: Therisa Bi, MD;  Location: [facility] ENDOSCOPY;  Service:  Gastroenterology;  Laterality: N/A;   FRACTURE SURGERY Bilateral    right arm and left wrist   INCISION AND DRAINAGE ABSCESS N/A 12/07/2016   Procedure: DRAINAGE  OF INTRA ABDOMINAL ABSCESS;  Surgeon: Cameron Dunnings, MD;  Location: [facility];  Service: General;  Laterality: N/A;   JOINT REPLACEMENT     LAPAROTOMY N/A 12/07/2016   Procedure: EXPLORATORY LAPAROTOMY;  Surgeon: Cameron Dunnings, MD;  Location: [facility];  Service: General;  Laterality: N/A;   PROSTATE SURGERY     Microwave therapy   TEE WITHOUT CARDIOVERSION N/A 01/03/2024   Procedure: ECHOCARDIOGRAM, TRANSESOPHAGEAL;  Surgeon: Cameron Evalene PARAS, MD;  Location: [facility];  Service: Cardiovascular;  Laterality: N/A;   TONSILLECTOMY     TOTAL HIP ARTHROPLASTY Right 09/27/2021   Procedure: TOTAL HIP ARTHROPLASTY ANTERIOR APPROACH;  Surgeon: Leora Lynwood SAUNDERS, MD;  Location: [facility];  Service: Orthopedics;  Laterality: Right;   TOTAL KNEE ARTHROPLASTY Right 07/27/2016   Procedure: TOTAL KNEE ARTHROPLASTY;  Surgeon: Kayla Pinal, MD;  Location: [facility];  Service: Orthopedics;  Laterality: Right;  Dr. Penne had to place Urinary catheter due to prostate cancer history.  Using flexible scope.    Social History:  reports that he quit smoking about 35 years ago. His smoking use included cigarettes. He started smoking about 62 years ago. He has a 27 pack-year smoking history. His smokeless tobacco use includes chew. He reports that he does not currently use alcohol after a past usage of about 7.0 standard drinks of alcohol per week. He reports that he does not use drugs.  Family History:  Family History  Problem Relation Age of Onset   Lung cancer Father        smoker   Cancer Father    Other Mother    Vision loss Mother    Sudden death Son        due to Blood clots   Bipolar disorder Son    Heart disease Son    Early death Son    Learning disabilities Son    Kidney disease Daughter        congenital one small kidney    Varicose Veins Sister    Prostate cancer Neg Hx    Bladder Cancer Neg Hx      Prior to Admission medications   Medication Sig Start Date End Date Taking? Authorizing Provider  albuterol  (VENTOLIN  HFA) 108 (90 Base) MCG/ACT inhaler Inhale 2 puffs into the lungs every 6 (six) hours as needed for wheezing or shortness of  breath. 09/12/23   Levander Slate, MD  Alpha-Lipoic Acid 600 MG TABS Take 1 tablet by mouth daily.    [provider]  amiodarone  (PACERONE ) 200 MG tablet Take 1 tablet (200 mg total) by mouth daily. 02/13/24   Cameron Morene PARAS, MD  apixaban  (ELIQUIS ) 5 MG TABS tablet Take 1 tablet (5 mg total) by mouth 2 (two) times daily. 01/23/24   Donette Ellouise LABOR, FNP  atorvastatin  (LIPITOR) 40 MG tablet Take 1 tablet (40 mg total) by mouth daily. 02/15/24   Cameron Jon [name], MD  cetirizine  (ZYRTEC ) 10 MG tablet Take 10 mg by mouth daily.    [provider]  Cholecalciferol (VITAMIN D-3) 25 MCG (1000 UT) CAPS Take 1,000 Units by mouth daily.    [provider]  dextromethorphan -guaiFENesin  (MUCINEX  DM) 30-600 MG 12hr tablet Take 1 tablet by mouth 2 (two) times daily as needed for cough.    [provider]  docusate sodium  (COLACE) 100 MG capsule Take 1 capsule (100 mg total) by mouth 2 (two) times daily. 09/30/21   Joshua Lin, PA-C  empagliflozin (JARDIANCE) 10 MG TABS tablet Take 10 mg by mouth daily. Patient not taking: Reported on 02/29/2024    [provider]  fluticasone  (FLONASE ) 50 MCG/ACT nasal spray PLACE 1 SPRAY INTO BOTH NOSTRILS DAILY AS NEEDED FOR ALLERGIES OR RHINITIS. 10/17/23   Bacigalupo, Jon [name], MD  folic acid  (FOLVITE ) 1 MG tablet Take 1 tablet (1 mg total) by mouth daily. 12/24/23   Caleen Qualia, MD  hydrocortisone  2.5 % cream APPLY TO AFFECTED AREA TWICE A DAY AS NEEDED FOR RASH 02/20/23   Cameron Jon [name], MD  ketoconazole  (NIZORAL ) 2 % cream Apply once or twice daily to affected skin folds as needed for rash 05/02/23    Jackquline Sawyer, MD  metoprolol  succinate (TOPROL -XL) 50 MG 24 hr tablet Take 1 tablet (50 mg total) by mouth daily. Take with or immediately following a meal. 02/13/24   Cameron Morene PARAS, MD  Multiple Vitamin (MULTIVITAMIN WITH MINERALS) TABS tablet Take 1 tablet by mouth daily. Patient not taking: Reported on 02/29/2024 10/24/18   Vachhani, Vaibhavkumar, MD  omeprazole  (PRILOSEC) 20 MG capsule TAKE 1 CAPSULE (20 MG TOTAL) BY MOUTH DAILY. REPORTS CURRENTLY TAKING AS NEEDED Patient not taking: Reported on 02/29/2024 09/21/23   Bacigalupo, Angela M, MD  omeprazole  (PRILOSEC) 20 MG capsule Take 1 capsule (20 mg total) by mouth daily as needed. 09/26/23   Bacigalupo, Angela M, MD  potassium chloride  SA (KLOR-CON  M) 20 MEQ tablet Take 2 tablets (40 mEq total) by mouth daily. 01/23/24   Donette Ellouise LABOR, FNP  sertraline  (ZOLOFT ) 100 MG tablet Take 1 tablet (100 mg total) by mouth daily. 02/16/24   Bacigalupo, Angela M, MD  tadalafil  (CIALIS ) 5 MG tablet Take 1 tablet (5 mg total) by mouth daily as needed for erectile dysfunction. 07/07/23   Bacigalupo, Angela M, MD  tadalafil  (CIALIS ) 5 MG tablet Take 1 tablet (5 mg total) by mouth every other day as needed. Patient not taking: Reported on 02/29/2024 07/05/23   Donzella Lauraine SAILOR, DO  thiamine  (VITAMIN B1) 100 MG tablet Take 100 mg by mouth daily.    [provider]  torsemide (DEMADEX) 20 MG tablet Take 2 tablets (40 mg total) by mouth 2 (two) times daily. 02/13/24   Cameron Morene PARAS, MD  vitamin B-12 (CYANOCOBALAMIN ) 1000 MCG tablet Take 1,000 mcg by mouth daily.    [provider]  zinc gluconate 50 MG tablet Take  50 mg by mouth daily.    [provider]    Physical Exam: Vitals:   02/29/24 2131 02/29/24 2315 03/01/24 0000 03/01/24 0115  BP:  (!) 141/94 124/77   Pulse:  84 78 79  Resp:  (!) 24 20 (!) 22  Temp: 98.1 F (36.7 C)   98 F (36.7 C)  TempSrc: Oral   Oral  SpO2:  98% 99% 98%  Weight:       General: Not in acute  distress.  Has anasarca HEENT:       Eyes: PERRL, EOMI, no jaundice       ENT: No discharge from the ears and nose, no pharynx injection, no tonsillar enlargement.        Neck: Difficult to assess JVD due to obesity, no bruit, no mass felt. Heme: No neck lymph node enlargement. Cardiac: S1/S2, RRR, No murmurs, No gallops or rubs. Respiratory: has fine crackles bilaterally GI: Soft, nondistended, nontender, no rebound pain, no organomegaly, BS present. GU: No hematuria Ext: 3+ pitting leg edema bilaterally. 1+DP/PT pulse bilaterally. Musculoskeletal: No joint deformities, No joint redness or warmth, no limitation of ROM in spin. Skin: No rashes.  Neuro: Alert, oriented X3, cranial nerves II-XII grossly intact, moves all extremities normally.  Psych: Patient is not psychotic, no suicidal or hemocidal ideation.  Labs on Admission: I have personally reviewed following labs and imaging studies  CBC: Recent Labs  Lab 02/29/24 1442 02/29/24 2250  WBC 7.3 7.2  HGB 8.5* 8.7*  HCT 28.1* 28.7*  MCV 92.4 90.5  PLT 249 224   Basic Metabolic Panel: Recent Labs  Lab 02/29/24 1442  NA 137  K 4.5  CL 100  CO2 28  GLUCOSE 132*  BUN 24*  CREATININE 2.37*  CALCIUM  9.0  MG 2.2   GFR: Estimated Creatinine Clearance: 26.8 mL/min (A) (by C-G formula based on SCr of 2.37 mg/dL (H)). Liver Function Tests: No results for input(s): AST, ALT, ALKPHOS, BILITOT, PROT, ALBUMIN  in the last 168 hours. No results for input(s): LIPASE, AMYLASE in the last 168 hours. No results for input(s): AMMONIA in the last 168 hours. Coagulation Profile: No results for input(s): INR, PROTIME in the last 168 hours. Cardiac Enzymes: No results for input(s): CKTOTAL, CKMB, CKMBINDEX, TROPONINI in the last 168 hours. BNP (last 3 results) Recent Labs    02/29/24 1442  PROBNP 1,845.0*   HbA1C: No results for input(s): HGBA1C in the last 72 hours. CBG: Recent Labs  Lab  02/29/24 2158  GLUCAP 123*   Lipid Profile: No results for input(s): CHOL, HDL, LDLCALC, TRIG, CHOLHDL, LDLDIRECT in the last 72 hours. Thyroid  Function Tests: No results for input(s): TSH, T4TOTAL, FREET4, T3FREE, THYROIDAB in the last 72 hours. Anemia Panel: Recent Labs    02/29/24 1442  FOLATE >20.0  FERRITIN 379*  TIBC 293  IRON 22*  RETICCTPCT 1.5   Urine analysis:    Component Value Date/Time   COLORURINE YELLOW (A) 09/12/2023 1450   APPEARANCEUR CLEAR (A) 09/12/2023 1450   APPEARANCEUR Clear 08/28/2017 0000   LABSPEC 1.018 09/12/2023 1450   PHURINE 5.0 09/12/2023 1450   GLUCOSEU NEGATIVE 09/12/2023 1450   HGBUR SMALL (A) 09/12/2023 1450   BILIRUBINUR NEGATIVE 09/12/2023 1450   BILIRUBINUR Negative 08/28/2017 0000   KETONESUR NEGATIVE 09/12/2023 1450   PROTEINUR 30 (A) 09/12/2023 1450   UROBILINOGEN 0.2 04/19/2017 1559   NITRITE NEGATIVE 09/12/2023 1450   LEUKOCYTESUR NEGATIVE 09/12/2023 1450   Sepsis Labs: @LABRCNTIP (procalcitonin:4,lacticidven:4) )No results found for this  or any previous visit (from the past 240 hours).   Radiological Exams on Admission:   Assessment/Plan Principal Problem:   Acute on chronic systolic CHF (congestive heart failure) (HCC) Active Problems:   Paroxysmal atrial fibrillation (HCC)   Hypotension   Essential hypertension   CAD (coronary artery disease)   Normocytic anemia   Hyperlipidemia LDL goal <70   Acute renal failure superimposed on stage 3a chronic kidney disease (HCC)   Depression with anxiety   Obesity (BMI 30-39.9)   Assessment and Plan:  Acute on chronic systolic CHF (congestive heart failure) [facility]): Patient has anasarca, significant elevated proBNP 1845, clinically consistent with CHF exacerbation.  No new oxygen requirement.  2D echo on 01/03/2024 showed EF of 30-35%.  Current blood pressure 139/92 --> 148/93.  -Will admit to PCU as inpatient -Lasix  60 mg bid by IV -Daily  weights -strict I/O's -Low salt diet -Fluid restriction -As needed bronchodilators for shortness of breath  Paroxysmal atrial fibrillation (HCC) -Amiodarone  - Hold metoprolol  due to soft blood pressure - Hold Eliquis  due to worsening anemia  Hypotension: Blood pressure has improved. -Observe closely, monitor blood pressure  Essential hypertension: - Hold metoprolol  - Patient is on IV Lasix   CAD (coronary artery disease): No chest pain -Lipitor  Normocytic anemia: Hemoglobin 8.5 (10.2 on 01/04/2024).  Patient denies rectal bleeding or dark stool. -Temporarily hold Eliquis  - Follow-up CBC every 6 hours - Check anemia panel  Hyperlipidemia LDL goal <70 -Lipitor  Acute renal failure superimposed on stage 3a chronic kidney disease (HCC): Recent baseline creatinine 1.83 on 02/01/2024.  His creatinine is 2.37, BUN 24, GFR 27.  Likely due to cardiorenal syndrome. - Avoid using renal toxic medications. - Follow-up by BMP  Depression with anxiety -Zoloft   Obesity (BMI 30-39.9): Patient has Obesity Class I, with body weight 94.8 Kg and BMI 30.86 kg/m2.  - Encourage losing weight - Exercise and healthy diet         DVT ppx: SCD  Code Status: Full code (one-time per patient)  Family Communication:   Yes, patient's sister   at bed side.    Disposition Plan:  Anticipate discharge back to previous environment  Consults called: None  Admission status and Level of care: Progressive:as inpt        Dispo: The patient is from: Home              Anticipated d/c is to: Home              Anticipated d/c date is: 2 days              Patient currently is not medically stable to d/c.    Severity of Illness:  The appropriate patient status for this patient is INPATIENT. Inpatient status is judged to be reasonable and necessary in order to provide the required intensity of service to ensure the patient's safety. The patient's presenting symptoms, physical exam findings, and  initial radiographic and laboratory data in the context of their chronic comorbidities is felt to place them at high risk for further clinical deterioration. Furthermore, it is not anticipated that the patient will be medically stable for discharge from the hospital within 2 midnights of admission.   * I certify that at the point of admission it is my clinical judgment that the patient will require inpatient hospital care spanning beyond 2 midnights from the point of admission due to high intensity of service, high risk for further deterioration and high frequency of surveillance required.*  Date of Service 03/01/2024    Caleb Exon [facility]   If 7PM-7AM, please contact night-coverage [email or web address] 03/01/2024, 2:15 AM

## 2024-02-29 NOTE — ED Triage Notes (Signed)
 Pt via POV from cardiology appointment. Pt has be having bilateral leg swelling for the past 3 weeks. States he also been having issues with his BP being low. RN at cardiology office states BP was 80/60. Pt takes fluid pill and did take it this AM. Pt is A&Ox4 and NAD

## 2024-02-29 NOTE — Telephone Encounter (Signed)
 Noted

## 2024-03-01 ENCOUNTER — Telehealth

## 2024-03-01 ENCOUNTER — Telehealth: Payer: Self-pay

## 2024-03-01 ENCOUNTER — Inpatient Hospital Stay (HOSPITAL_COMMUNITY)
Admit: 2024-03-01 | Discharge: 2024-03-01 | Disposition: A | Discharge status: Home / Self Care | Attending: Physician Assistant | Admitting: Physician Assistant

## 2024-03-01 DIAGNOSIS — I4819 Other persistent atrial fibrillation: Secondary | ICD-10-CM | POA: Diagnosis not present

## 2024-03-01 DIAGNOSIS — I5020 Unspecified systolic (congestive) heart failure: Secondary | ICD-10-CM | POA: Diagnosis not present

## 2024-03-01 DIAGNOSIS — N179 Acute kidney failure, unspecified: Secondary | ICD-10-CM | POA: Diagnosis not present

## 2024-03-01 DIAGNOSIS — I428 Other cardiomyopathies: Secondary | ICD-10-CM | POA: Diagnosis not present

## 2024-03-01 DIAGNOSIS — N189 Chronic kidney disease, unspecified: Secondary | ICD-10-CM

## 2024-03-01 DIAGNOSIS — I5023 Acute on chronic systolic (congestive) heart failure: Secondary | ICD-10-CM | POA: Diagnosis not present

## 2024-03-01 LAB — CBC
HCT: 26.5 % — ABNORMAL LOW (ref 39.0–52.0)
HCT: 25.7 % — ABNORMAL LOW (ref 39.0–52.0)
Hemoglobin: 8.2 g/dL — ABNORMAL LOW (ref 13.0–17.0)
Hemoglobin: 8 g/dL — ABNORMAL LOW (ref 13.0–17.0)
MCH: 27.8 pg (ref 26.0–34.0)
MCH: 27.6 pg (ref 26.0–34.0)
MCHC: 30.9 g/dL (ref 30.0–36.0)
MCHC: 31.1 g/dL (ref 30.0–36.0)
MCV: 89.8 fL (ref 80.0–100.0)
MCV: 88.6 fL (ref 80.0–100.0)
Platelets: 231 K/uL (ref 150–400)
Platelets: 220 K/uL (ref 150–400)
RBC: 2.95 MIL/uL — ABNORMAL LOW (ref 4.22–5.81)
RBC: 2.9 MIL/uL — ABNORMAL LOW (ref 4.22–5.81)
RDW: 15 % (ref 11.5–15.5)
RDW: 14.9 % (ref 11.5–15.5)
WBC: 6.2 K/uL (ref 4.0–10.5)
WBC: 7.5 K/uL (ref 4.0–10.5)
nRBC: 0 % (ref 0.0–0.2)
nRBC: 0 % (ref 0.0–0.2)

## 2024-03-01 LAB — BASIC METABOLIC PANEL WITH GFR
Anion gap: 8 (ref 5–15)
BUN: 23 mg/dL (ref 8–23)
CO2: 30 mmol/L (ref 22–32)
Calcium: 9 mg/dL (ref 8.9–10.3)
Chloride: 99 mmol/L (ref 98–111)
Creatinine, Ser: 2.22 mg/dL — ABNORMAL HIGH (ref 0.61–1.24)
GFR, Estimated: 29 mL/min — ABNORMAL LOW (ref >60)
Glucose, Bld: 95 mg/dL (ref 70–99)
Potassium: 3.9 mmol/L (ref 3.5–5.1)
Sodium: 137 mmol/L (ref 135–145)

## 2024-03-01 LAB — ECHOCARDIOGRAM LIMITED
Height: 69 in
S' Lateral: 3.2 cm
Weight: 3344 [oz_av]

## 2024-03-01 MED ORDER — VITAMIN D3 25 MCG (1000 UNIT) PO TABS
1000.0000 [IU] | ORAL_TABLET | Freq: Every day | ORAL | Status: DC
  Administered 2024-03-01 – 2024-03-12 (×12): 1000 [IU] via ORAL
  Filled 2024-03-01 (×25): qty 1

## 2024-03-01 MED ORDER — ZINC SULFATE 220 (50 ZN) MG PO CAPS
220.0000 mg | ORAL_CAPSULE | Freq: Every day | ORAL | Status: DC
  Administered 2024-03-01 – 2024-03-12 (×12): 220 mg via ORAL
  Filled 2024-03-01 (×13): qty 1

## 2024-03-01 MED ORDER — SERTRALINE HCL 50 MG PO TABS
100.0000 mg | ORAL_TABLET | Freq: Every day | ORAL | Status: DC
  Administered 2024-03-01 – 2024-03-12 (×12): 100 mg via ORAL
  Filled 2024-03-01 (×13): qty 2

## 2024-03-01 MED ORDER — VITAMIN B-12 1000 MCG PO TABS
1000.0000 ug | ORAL_TABLET | Freq: Every day | ORAL | Status: DC
  Administered 2024-03-01 – 2024-03-12 (×12): 1000 ug via ORAL
  Filled 2024-03-01 (×7): qty 1
  Filled 2024-03-01: qty 2
  Filled 2024-03-01 (×5): qty 1

## 2024-03-01 MED ORDER — ATORVASTATIN CALCIUM 20 MG PO TABS
40.0000 mg | ORAL_TABLET | Freq: Every day | ORAL | Status: DC
  Administered 2024-03-01 – 2024-03-12 (×12): 40 mg via ORAL
  Filled 2024-03-01 (×12): qty 2

## 2024-03-01 MED ORDER — AMIODARONE HCL 200 MG PO TABS
200.0000 mg | ORAL_TABLET | Freq: Every day | ORAL | Status: DC
  Administered 2024-03-01 – 2024-03-12 (×12): 200 mg via ORAL
  Filled 2024-03-01 (×13): qty 1

## 2024-03-01 MED ORDER — LORATADINE 10 MG PO TABS
10.0000 mg | ORAL_TABLET | Freq: Every day | ORAL | Status: DC
  Administered 2024-03-01 – 2024-03-12 (×12): 10 mg via ORAL
  Filled 2024-03-01 (×13): qty 1

## 2024-03-01 MED ORDER — FLUTICASONE PROPIONATE 50 MCG/ACT NA SUSP: 1.0000 | Freq: Every day | NASAL | Status: DC | PRN

## 2024-03-01 MED ORDER — PANTOPRAZOLE SODIUM 40 MG PO TBEC
40.0000 mg | DELAYED_RELEASE_TABLET | Freq: Every day | ORAL | Status: DC
  Administered 2024-03-01 – 2024-03-12 (×12): 40 mg via ORAL
  Filled 2024-03-01 (×12): qty 1

## 2024-03-01 MED ORDER — FUROSEMIDE 10 MG/ML IJ SOLN
60.0000 mg | Freq: Two times a day (BID) | INTRAMUSCULAR | Status: DC
  Administered 2024-03-01 – 2024-03-05 (×9): 60 mg via INTRAVENOUS
  Filled 2024-03-01 (×4): qty 6
  Filled 2024-03-01: qty 8
  Filled 2024-03-01 (×4): qty 6

## 2024-03-01 MED ORDER — ALPHA-LIPOIC ACID 600 MG PO TABS: 1.0000 | ORAL_TABLET | Freq: Every day | ORAL | Status: DC

## 2024-03-01 MED ORDER — THIAMINE HCL 100 MG PO TABS
100.0000 mg | ORAL_TABLET | Freq: Every day | ORAL | Status: DC
  Administered 2024-03-01 – 2024-03-12 (×12): 100 mg via ORAL
  Filled 2024-03-01 (×25): qty 1

## 2024-03-01 MED ORDER — FOLIC ACID 1 MG PO TABS
1.0000 mg | ORAL_TABLET | Freq: Every day | ORAL | Status: DC
  Administered 2024-03-01 – 2024-03-12 (×12): 1 mg via ORAL
  Filled 2024-03-01 (×13): qty 1

## 2024-03-01 NOTE — Telephone Encounter (Signed)
Verbals provided

## 2024-03-01 NOTE — Progress Notes (Addendum)
 PROGRESS NOTE    Cameron Foster.  FMW:[id number] DOB: [birth year] DOA: 02/29/2024 PCP: Myrla Jon [name], MD  Chief Complaint  Patient presents with   Leg Swelling    Hospital Course:  Nadir Vasques. is a 83 y.o. male with medical history significant of CHF with EF 30 to 35%, stroke, depression with anxiety, hypertension, hyperlipidemia, prediabetes, CAD, CKD-3A, A-fib on Eliquis , right bundle blockade, IBS, obesity, alcohol use, who presents with anasarca and low blood pressure.  Was sent to ED from clinic.  Patient was admitted for acute on chronic heart failure.  Hospital course as below  Subjective: Patient was examined at the bedside, new to me today.  Reports feeling better today. Denies any complaints today   Objective: Vitals:   03/01/24 0845 03/01/24 0855 03/01/24 0900 03/01/24 0958  BP: (!) 93/56 95/62  99/64  Pulse:    70  Resp: 19 18  17   Temp:    98.2 F (36.8 C)  TempSrc:    Oral  SpO2:    97%  Weight:      Height:   5' 9 (1.753 m)    No intake or output data in the 24 hours ending 03/01/24 1005 Filed Weights   02/29/24 1437  Weight: 94.8 kg    Examination: General: Not in acute distress.  Has anasarca Neck: Difficult to assess JVD due to obesity, no bruit, no mass felt. Cardiac: S1/S2, RRR, No murmurs, No gallops or rubs. Respiratory: has fine crackles bilaterally GI: Soft, nondistended, nontender, no rebound pain, no organomegaly, BS present. GU: No hematuria Ext: 3+ pitting leg edema bilaterally. 1+DP/PT pulse bilaterally. Musculoskeletal: No joint deformities, No joint redness or warmth, no limitation of ROM in spin. Skin: No rashes.  Neuro: Alert, oriented X3, cranial nerves II-XII grossly intact, moves all extremities normally  Assessment & Plan:  Acute on chronic systolic CHF (congestive heart failure) - Limited Echo shows EF 60 to 65% moderate LVH.  Moderate pericardial effusion ? Hemopericardium in the setting of AC - Lasix  60 mg  bid by IV - GDMT on hold due to hypotension - May need RHC next week - Seen by cardiology, appreciate recs - Daily weight, strict I/o's   Paroxysmal atrial fibrillation (HCC) - s/p TEE guided DCCV - Amiodarone  200mg  daily - CHA2DS2-VASc score 7, on Eliquis  - held due to concern for hemopericardium - Hold metoprolol  due to soft blood pressure   Essential hypertension: - Hold metoprolol  - Patient is on IV Lasix    CAD (coronary artery disease): No chest pain - Lipitor   Normocytic anemia: Hemoglobin 8.5 (10.2 on 01/04/2024).  Patient denies rectal bleeding or dark stool. - Eliquis  on hold due to concern for hemopericardium - Iron panel with elevated ferritin consistent with ACD - FA, B12, reticulocytes, FOBT, UA pending   Hyperlipidemia LDL goal <70 -Lipitor   AKI on CKD stage 3a - Recent baseline creatinine 1.83 on 02/01/2024.  Likely due to cardiorenal syndrome - Cr 2.37 -> 2.22 - Avoid using renal toxic medications. - Follow-up by BMP   Depression with anxiety -Zoloft   OSA - CPAP at night  Alcohol and tobacco use - cessation counseling - FA, MV, B1   Obesity (BMI 30-39.9): Patient has Obesity Class I, with body weight 94.8 Kg and BMI 30.86 kg/m2.  - Encourage losing weight - Exercise and healthy diet   DVT prophylaxis: SCD   Code Status: Full Code Disposition:  TBD  Consultants:  Treatment Team:  Consulting Physician: End,  Lonni, MD Consulting Physician: Perla Evalene PARAS, MD  Procedures:  None  Antimicrobials:  Anti-infectives (From admission, onward)    None       Data Reviewed: I have personally reviewed following labs and imaging studies CBC: Recent Labs  Lab 02/29/24 1442 02/29/24 2250 03/01/24 0454  WBC 7.3 7.2 6.2  HGB 8.5* 8.7* 8.2*  HCT 28.1* 28.7* 26.5*  MCV 92.4 90.5 89.8  PLT 249 224 231   Basic Metabolic Panel: Recent Labs  Lab 02/29/24 1442 03/01/24 0454  NA 137 137  K 4.5 3.9  CL 100 99  CO2 28 30  GLUCOSE  132* 95  BUN 24* 23  CREATININE 2.37* 2.22*  CALCIUM  9.0 9.0  MG 2.2  --    GFR: Estimated Creatinine Clearance: 28.6 mL/min (A) (by C-G formula based on SCr of 2.22 mg/dL (H)). Liver Function Tests: No results for input(s): AST, ALT, ALKPHOS, BILITOT, PROT, ALBUMIN  in the last 168 hours. CBG: Recent Labs  Lab 02/29/24 2158  GLUCAP 123*    No results found for this or any previous visit (from the past 240 hours).   Radiology Studies: DG Chest 2 View Result Date: 02/29/2024 CLINICAL DATA:  Shortness of breath. EXAM: DG CHEST 2V COMPARISON:  Chest radiograph dated 12/31/2023. FINDINGS: No focal consolidation, pleural effusion or pneumothorax. Faint peripheral densities, likely sequela prior inflammatory process. Stable cardiac silhouette. Atherosclerotic calcification of the aorta. No acute osseous pathology. IMPRESSION: No active cardiopulmonary disease. Electronically Signed   By: Vanetta Chou M.D.   On: 02/29/2024 15:39    Scheduled Meds:  amiodarone   200 mg Oral Daily   atorvastatin   40 mg Oral Daily   cholecalciferol  1,000 Units Oral Daily   cyanocobalamin   1,000 mcg Oral Daily   folic acid   1 mg Oral Daily   furosemide   60 mg Intravenous Q12H   loratadine   10 mg Oral Daily   pantoprazole   40 mg Oral Daily   sertraline   100 mg Oral Daily   thiamine   100 mg Oral Daily   zinc sulfate (50mg  elemental zinc)  220 mg Oral Daily   Continuous Infusions:   LOS: 1 day  MDM: Patient is high risk for one or more organ failure.  They necessitate ongoing hospitalization for continued IV therapies and subsequent lab monitoring. Total time spent interpreting labs and vitals, reviewing the medical record, coordinating care amongst consultants and care team members, directly assessing and discussing care with the patient and/or family: 55 min Laree Lock, MD [facility]  To contact the attending physician between 7A-7P please use Epic Chat. To contact the  covering physician during after hours 7P-7A, please review Amion.  03/01/2024, 10:05 AM   *This document has been created with the assistance of dictation software. Please excuse typographical errors. *

## 2024-03-01 NOTE — Consult Note (Signed)
 Cardiology Consultation:   Patient ID: Cameron Foster.; [id number]; 1940-06-28   Admit date: 02/29/2024 Date of Consult: 03/01/2024  Primary Care Provider: Myrla Jon [name], MD Primary Cardiologist: End Primary Electrophysiologist:  Beecher   Patient Profile:   Cameron Foster. is a 83 y.o. male with a hx of HFrEF, PAF, CKD stage IIIa, anemia, CVA, subarachnoid hemorrhage, HTN, HLD, colitis with prior GI bleeding, prostate cancer, and alcohol and tobacco use who is being seen today for the evaluation of acute on chronic HFrEF at the request of Dr. Hilma.  History of Present Illness:   [name] was admitted to the hospital in 06/2021 with left-sided colitis felt to be ischemic versus infectious.  Flexible sigmoidoscopy noted possible ischemic colitis.  He was treated with antibiotics.  His admission was complicated by hypotension and A-fib with RVR.  Echo demonstrated an EF of 45 to 50%, global hypokinesis, moderate concentric LVH, elevated left ventricular end-diastolic pressure, normal RV systolic function and ventricular cavity size, normal PASP, mildly dilated left atrium, mild mitral regurgitation, mild calcification of the aortic valve without evidence of stenosis, mildly dilated aortic root and ascending aorta measuring 41 and 39 mm respectively.  He was ultimately placed on IV amiodarone  for rate control, which led to pharmacologic cardioversion.  After extensive discussion of risk versus benefits of anticoagulation with the patient and his family, the decision was ultimately made to initiate apixaban .  Subsequent outpatient cardiac monitoring showed no evidence of A-fib with multiple brief episodes of paroxysmal SVT and 1 short run of NSVT.  Given lack of recurrence of A-fib, and to minimize bleeding risk, OAC was subsequently discontinued.  Echo in 04/2022 showed improvement in LV systolic function with an EF of 55 to 60%, no regional wall motion abnormalities, grade 2 diastolic  dysfunction, normal RV systolic function and ventricular cavity size, and trivial aortic insufficiency.  More recently, he was admitted to the hospital in 12/2023 with A-fib with RVR.  Echo at that time showed an EF of 55 to 60% with no regional wall motion abnormalities, and mild LVH.  Admission was complicated by he was reinitiated on apixaban  with plans for outpatient cardioversion, remaining in A-fib at time of discharge.  He was readmitted later in 12/2023 with dull chest pain and remained in A-fib.  During the admission he underwent TEE-guided DCCV with TEE showing an EF of 30 to 35%, global hypokinesis, normal RV systolic function and ventricular cavity size, severely dilated left atrium, mild mitral regurgitation, aortic atherosclerosis, normal CVP, and a small patent foramen ovale with a predominant left-to-right shunting across the atrial septum.  He was seen by the advanced heart failure service on 02/13/2024 and volume up.  It was recommended he transition furosemide  to torsemide 40 mg twice daily.  With hypotension, he remained off losartan  and spironolactone and metoprolol  was reduced to 50 mg daily.  He was seen in the office by EP on 02/29/2024 for follow-up.  He was maintaining sinus rhythm.  Despite escalation in transition of diuretic by heart failure in late 01/2024, he remained hypervolemic.  Blood pressure also remained low at 80/40.  In this setting, he had not been taking evening dose of torsemide.  He was subsequently transferred to the ED.  Upon arrival to [facility] ED his BP has remained largely stable in the 90s to 140s mmHg systolic.  He has remained in sinus rhythm with rates in the 70s to 80s bpm.  Chest x-ray showed no active cardiopulmonary  disease.  EKG showed sinus rhythm with a right bundle branch block and first-degree AV block with nonspecific ST-T changes.  proBNP elevated at 1845.  Initial serum creatinine elevated at 2.37 up from baseline around 1.3-1.8, improving to 2.22 with  IV diuresis.  Other notable labs include a hemoglobin downtrending from 11 range to 8.2 over the past several months.  At time of cardiology consult, patient continues to feel short of breath and notes lower extremity swelling.  He denies abdominal distention.  He is on certain if he has had progressive orthopnea.  No symptoms of frank chest pain.  No palpitations, dizziness, presyncope, or syncope.  He denies symptoms of melena, hematochezia, hemoptysis, hematuria, or hematemesis.  Reports vigorous urine output with IV Lasix .   Past Medical History:  Diagnosis Date   Alcohol dependence (HCC) 09/30/2021   Alcoholism (HCC)    Allergy    Aortic atherosclerosis    Arthritis    Ascending aorta dilation 03/28/2018   a.) Vascular US : prox asc Ao measured 29 mm. b.)  CT CAP 06/22/2021: Ao root 41 mm. c.) TTE 06/26/2021: Ao root 41 mm; asc Ao 39 mm   Atrial fibrillation (HCC) 1995   a.) single episode in 1995 per patient; no long term treatment. b.) recurrent episode in the setting of GI bleeding related to colitis 06/2021.   Basal cell carcinoma 04/26/2017   Right medial cheek. Superficial and nodular   Basal cell carcinoma 06/11/2019   Left anterior shoulder. Nodular pattern   Basal cell carcinoma 09/09/2019   Right nasal ala, [facility]   Basal cell carcinoma 02/05/2020   L upper eyebrow, [facility]    Basal cell carcinoma 10/12/2022   left post auricular, [facility]   Basal cell carcinoma 05/02/2023   Right medial cheek, [facility]   Bilateral carotid artery stenosis 06/09/2019   a.) Carotid doppler: mod; <50% BILATERAL ICAs.   Cataract    Chicken pox    Colon cancer (HCC)    Colon polyp    Depression    Son died 03/27/2015   Diverticulitis    Diverticulosis 30 years   Encounter for other preprocedural examination 06/21/2021   Gastritis    GERD (gastroesophageal reflux disease)    H. pylori infection    History of stress test    a. 09/2015 MV: No ischemia/infarct. EF 45-54% (nl by echo).   Hyperlipidemia     Hypertension    Irritable bowel syndrome    Lacunar infarction (HCC) 06/08/2019   a.) small; RIGHT motor strip   Myocardial infarction [facility])    Neuromuscular disorder (HCC)    NSVT (nonsustained ventricular tachycardia) (HCC)    a.)  Single episode lasting 5 beats at a maximum rate of 160 bpm noted on Holter study performed 08/13/2021.   Prostate cancer (HCC) March 27, 2011   a.) s/p XRT   PSVT (paroxysmal supraventricular tachycardia)    a. 06/2019 Zio: Avg rate 74 (54-120), occas PACs, rare PVCs, 125 episodes of PVCs (longest 17.5 secs; max rate 187). No afib.   RBBB    SAH (subarachnoid hemorrhage) (HCC) 10/10/2018   Sleep apnea    Squamous cell carcinoma of skin 07/18/2017   Left medial calf. KA type   Squamous cell carcinoma of skin 04/24/2018   Right above med. brow   Squamous cell carcinoma of skin 06/11/2019   Right posterior shoulder. SCCis, hypertrophic   Squamous cell carcinoma of skin 01/09/2020   Mid nasal dorsum, MOHS, Efudex x 4wks   Stroke (HCC)  Substance abuse (HCC)    Systolic dysfunction    a.) TTE 10/05/2015: EF 50-55%; mild LVH; LAE; triv AR, mild MR. b.) TTE 03/29/2018: EF 55-60%; LAE, mild AR; ? small PFO. c.) TTE 06/09/2019: EF 55-60%, no rwma, triv MR/AI. d.) TTE 06/26/2021: EF 45-50%; glob HK; LAE; mild MR; Ao root 41 mm; asc Ao 39 mm.   Vasovagal syncope     Past Surgical History:  Procedure Laterality Date   CARDIAC CATHETERIZATION  1998   [place] no stents   CARDIOVERSION N/A 01/03/2024   Procedure: CARDIOVERSION;  Surgeon: Perla Evalene PARAS, MD;  Location: [facility] ORS;  Service: Cardiovascular;  Laterality: N/A;   CARDIOVERSION N/A 01/17/2024   Procedure: CARDIOVERSION;  Surgeon: Zenaida Morene PARAS, MD;  Location: [facility] ORS;  Service: Cardiovascular;  Laterality: N/A;   CATARACT EXTRACTION, BILATERAL     COLON RESECTION SIGMOID N/A 12/07/2016   Procedure: COLON RESECTION SIGMOID;  Surgeon: Shelva Dunnings, MD;  Location: [facility] ORS;  Service: General;   Laterality: N/A;   COLON SURGERY  11/2016   Colostomy   COLONOSCOPY  2016   COLONOSCOPY WITH PROPOFOL  N/A 05/30/2016   Procedure: COLONOSCOPY WITH PROPOFOL ;  Surgeon: Gladis RAYMOND Mariner, MD;  Location: [facility] ENDOSCOPY;  Service: Endoscopy;  Laterality: N/A;   COLOSTOMY Left 12/07/2016   Procedure: COLOSTOMY;  Surgeon: Shelva Dunnings, MD;  Location: [facility];  Service: General;  Laterality: Left;   COLOSTOMY REVERSAL N/A 03/21/2017   Procedure: COLOSTOMY REVERSAL;  Surgeon: Shelva Dunnings, MD;  Location: [facility];  Service: General;  Laterality: N/A;   COLOSTOMY TAKEDOWN N/A 03/21/2017   Procedure: LAPAROSCOPIC COLOSTOMY TAKEDOWN;  Surgeon: Shelva Dunnings, MD;  Location: [facility];  Service: General;  Laterality: N/A;   CYSTOSCOPY WITH STENT PLACEMENT Bilateral 03/21/2017   Procedure: CYSTOSCOPY WITH LIGHTED STENT PLACEMENT;  Surgeon: Twylla Glendia BROCKS, MD;  Location: [facility];  Service: Urology;  Laterality: Bilateral;   ESOPHAGOGASTRODUODENOSCOPY     ESOPHAGOGASTRODUODENOSCOPY N/A 05/30/2016   Procedure: ESOPHAGOGASTRODUODENOSCOPY (EGD);  Surgeon: Gladis RAYMOND Mariner, MD;  Location: [facility] ENDOSCOPY;  Service: Endoscopy;  Laterality: N/A;   ESOPHAGOGASTRODUODENOSCOPY N/A 01/14/2021   Procedure: ESOPHAGOGASTRODUODENOSCOPY (EGD);  Surgeon: Toledo, Ladell POUR, MD;  Location: [facility] ENDOSCOPY;  Service: Gastroenterology;  Laterality: N/A;   ESOPHAGOGASTRODUODENOSCOPY (EGD) WITH PROPOFOL  N/A 04/28/2021   Procedure: ESOPHAGOGASTRODUODENOSCOPY (EGD) WITH PROPOFOL ;  Surgeon: Toledo, Ladell POUR, MD;  Location: [facility] ENDOSCOPY;  Service: Gastroenterology;  Laterality: N/A;   EYE SURGERY     cataracts   FLEXIBLE SIGMOIDOSCOPY N/A 03/21/2017   Procedure: FLEXIBLE SIGMOIDOSCOPY;  Surgeon: Shelva Dunnings, MD;  Location: [facility];  Service: General;  Laterality: N/A;   FLEXIBLE SIGMOIDOSCOPY N/A 06/24/2021   Procedure: ENID MORIN;  Surgeon: Therisa Bi, MD;  Location: [facility] ENDOSCOPY;  Service:  Gastroenterology;  Laterality: N/A;   FRACTURE SURGERY Bilateral    right arm and left wrist   INCISION AND DRAINAGE ABSCESS N/A 12/07/2016   Procedure: DRAINAGE  OF INTRA ABDOMINAL ABSCESS;  Surgeon: Shelva Dunnings, MD;  Location: [facility];  Service: General;  Laterality: N/A;   JOINT REPLACEMENT     LAPAROTOMY N/A 12/07/2016   Procedure: EXPLORATORY LAPAROTOMY;  Surgeon: Shelva Dunnings, MD;  Location: [facility];  Service: General;  Laterality: N/A;   PROSTATE SURGERY     Microwave therapy   TEE WITHOUT CARDIOVERSION N/A 01/03/2024   Procedure: ECHOCARDIOGRAM, TRANSESOPHAGEAL;  Surgeon: Perla Evalene PARAS, MD;  Location: [facility];  Service: Cardiovascular;  Laterality: N/A;   TONSILLECTOMY     TOTAL HIP ARTHROPLASTY Right  09/27/2021   Procedure: TOTAL HIP ARTHROPLASTY ANTERIOR APPROACH;  Surgeon: Leora Lynwood SAUNDERS, MD;  Location: [facility];  Service: Orthopedics;  Laterality: Right;   TOTAL KNEE ARTHROPLASTY Right 07/27/2016   Procedure: TOTAL KNEE ARTHROPLASTY;  Surgeon: Kayla Pinal, MD;  Location: [facility];  Service: Orthopedics;  Laterality: Right;  Dr. Penne had to place Urinary catheter due to prostate cancer history.  Using flexible scope.     Home Meds: Prior to Admission medications   Medication Sig Start Date End Date Taking? Authorizing Provider  albuterol  (VENTOLIN  HFA) 108 (90 Base) MCG/ACT inhaler Inhale 2 puffs into the lungs every 6 (six) hours as needed for wheezing or shortness of breath. 09/12/23  Yes Ray, Neha, MD  Alpha-Lipoic Acid 600 MG TABS Take 1 tablet by mouth daily.   Yes [provider]  amiodarone  (PACERONE ) 200 MG tablet Take 1 tablet (200 mg total) by mouth daily. 02/13/24  Yes Zenaida Morene PARAS, MD  apixaban  (ELIQUIS ) 5 MG TABS tablet Take 1 tablet (5 mg total) by mouth 2 (two) times daily. 01/23/24  Yes Hackney, Tina A, FNP  atorvastatin  (LIPITOR) 40 MG tablet Take 1 tablet (40 mg total) by mouth daily. 02/15/24  Yes Bacigalupo, Jon [name], MD   cetirizine  (ZYRTEC ) 10 MG tablet Take 10 mg by mouth daily.   Yes [provider]  Cholecalciferol (VITAMIN D-3) 25 MCG (1000 UT) CAPS Take 1,000 Units by mouth daily.   Yes [provider]  dextromethorphan -guaiFENesin  (MUCINEX  DM) 30-600 MG 12hr tablet Take 1 tablet by mouth 2 (two) times daily as needed for cough.   Yes [provider]  docusate sodium  (COLACE) 100 MG capsule Take 1 capsule (100 mg total) by mouth 2 (two) times daily. 09/30/21  Yes Joshua Lin, PA-C  empagliflozin (JARDIANCE) 10 MG TABS tablet Take 10 mg by mouth daily.   Yes [provider]  fluticasone  (FLONASE ) 50 MCG/ACT nasal spray PLACE 1 SPRAY INTO BOTH NOSTRILS DAILY AS NEEDED FOR ALLERGIES OR RHINITIS. 10/17/23  Yes Bacigalupo, Jon [name], MD  folic acid  (FOLVITE ) 1 MG tablet Take 1 tablet (1 mg total) by mouth daily. 12/24/23  Yes Caleen Qualia, MD  hydrocortisone  2.5 % cream APPLY TO AFFECTED AREA TWICE A DAY AS NEEDED FOR RASH 02/20/23  Yes Bacigalupo, Jon [name], MD  ketoconazole  (NIZORAL ) 2 % cream Apply once or twice daily to affected skin folds as needed for rash 05/02/23  Yes Jackquline Sawyer, MD  metoprolol  succinate (TOPROL -XL) 50 MG 24 hr tablet Take 1 tablet (50 mg total) by mouth daily. Take with or immediately following a meal. 02/13/24  Yes Zenaida Morene PARAS, MD  omeprazole  (PRILOSEC) 20 MG capsule Take 1 capsule (20 mg total) by mouth daily as needed. 09/26/23  Yes Bacigalupo, Jon [name], MD  potassium chloride  SA (KLOR-CON  M) 20 MEQ tablet Take 2 tablets (40 mEq total) by mouth daily. 01/23/24  Yes Hackney, Ellouise A, FNP  sertraline  (ZOLOFT ) 100 MG tablet Take 1 tablet (100 mg total) by mouth daily. 02/16/24  Yes Bacigalupo, Jon [name], MD  tadalafil  (CIALIS ) 5 MG tablet Take 1 tablet (5 mg total) by mouth daily as needed for erectile dysfunction. 07/07/23  Yes Bacigalupo, Jon [name], MD  thiamine  (VITAMIN B1) 100 MG tablet Take 100 mg by mouth daily.   Yes [provider]   torsemide (DEMADEX) 20 MG tablet Take 2 tablets (40 mg total) by mouth 2 (two) times daily. 02/13/24  Yes Zenaida Morene PARAS, MD  vitamin B-12 (CYANOCOBALAMIN ) 1000  MCG tablet Take 1,000 mcg by mouth daily.   Yes [provider]  zinc gluconate 50 MG tablet Take 50 mg by mouth daily.   Yes [provider]  Multiple Vitamin (MULTIVITAMIN WITH MINERALS) TABS tablet Take 1 tablet by mouth daily. Patient not taking: No sig reported 10/24/18   Vachhani, Vaibhavkumar, MD  omeprazole  (PRILOSEC) 20 MG capsule TAKE 1 CAPSULE (20 MG TOTAL) BY MOUTH DAILY. REPORTS CURRENTLY TAKING AS NEEDED Patient not taking: No sig reported 09/21/23   Myrla Jon [name], MD  tadalafil  (CIALIS ) 5 MG tablet Take 1 tablet (5 mg total) by mouth every other day as needed. Patient not taking: No sig reported 07/05/23   Donzella Lauraine SAILOR, DO    Inpatient Medications: Scheduled Meds:  amiodarone   200 mg Oral Daily   atorvastatin   40 mg Oral Daily   cholecalciferol  1,000 Units Oral Daily   cyanocobalamin   1,000 mcg Oral Daily   folic acid   1 mg Oral Daily   furosemide   60 mg Intravenous Q12H   loratadine   10 mg Oral Daily   pantoprazole   40 mg Oral Daily   sertraline   100 mg Oral Daily   thiamine   100 mg Oral Daily   zinc sulfate (50mg  elemental zinc)  220 mg Oral Daily   Continuous Infusions:  PRN Meds: acetaminophen , albuterol , dextromethorphan -guaiFENesin , fluticasone , hydrALAZINE , ondansetron  (ZOFRAN ) IV  Allergies:   Allergies  Allergen Reactions   Amoxicillin -Pot Clavulanate Diarrhea and Other (See Comments)    Abdominal upset  amoxicillin  / clavulanate   Formaldehyde Rash    From shoes made with this material    Social History:   Social History   Socioeconomic History   Marital status: Widowed    Spouse name: Roselie   Number of children: 2   Years of education: Not on file   Highest education level: Bachelor's degree (e.g., BA, AB, BS)  Occupational History   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  Wine, Beer or Liquor.   Drug use: No   Sexual activity: Not Currently    Birth control/protection: None    Comment: Married  Other Topics Concern   Not on file  Social History Narrative   1 son deceased, 1 daughter living   Social Drivers of Corporate Investment Banker Strain: Low Risk  (01/28/2024)   Overall Financial Resource Strain (CARDIA)    Difficulty of Paying Living Expenses: Not very hard  Food Insecurity: No Food Insecurity (03/01/2024)   Hunger Vital Sign    Worried About Running Out of Food in the Last Year: Never true    Ran Out of Food in the Last Year: Never true  Transportation Needs: No Transportation Needs (03/01/2024)   PRAPARE - Administrator, Civil Service (Medical): No    Lack of Transportation (Non-Medical): No  Physical Activity: Inactive (01/28/2024)   Exercise Vital Sign    Days of Exercise per Week: 0 days    Minutes of Exercise per Session: Not on file  Stress: No Stress Concern Present (01/28/2024)   [employer] of Occupational Health - Occupational Stress Questionnaire    Feeling of Stress: Only a little  Recent Concern: Stress - Stress Concern Present (11/24/2023)   [employer] of Occupational Health - Occupational Stress Questionnaire    Feeling of Stress: To some extent  Social Connections: Moderately Integrated (03/01/2024)   Social Connection and Isolation Panel    Frequency  of Communication with Friends and Family: Three times a week    Frequency of Social Gatherings with Friends and Family: Twice a week    Attends Religious Services: More than 4 times per year    Active Member of [employer] or Organizations: Yes    Attends Banker Meetings: More than 4 times per year    Marital Status: Widowed  Intimate Partner Violence: Not At Risk (03/01/2024)   Humiliation, Afraid, Rape, and Kick questionnaire    Fear of Current or Ex-Partner: No    Emotionally Abused: No    Physically Abused: No    Sexually Abused: No     Family History:   Family History  Problem Relation Age of Onset   Lung cancer Father        smoker   Cancer Father    Other Mother    Vision loss Mother    Sudden death Son        due to Blood clots   Bipolar disorder Son    Heart disease Son    Early death Son    Learning disabilities Son    Kidney disease Daughter        congenital one small kidney   Varicose Veins Sister    Prostate cancer Neg Hx    Bladder Cancer Neg Hx     ROS:  Review of Systems  Constitutional:  Positive for malaise/fatigue. Negative for chills, diaphoresis, fever and weight loss.  HENT:  Negative for congestion.   Eyes:  Negative for discharge and redness.  Respiratory:  Positive for shortness of breath. Negative for cough, hemoptysis, sputum production and wheezing.   Cardiovascular:  Positive for leg swelling. Negative for chest pain, palpitations, orthopnea, claudication and PND.  Gastrointestinal:  Negative for abdominal pain, blood in stool, heartburn, melena, nausea and vomiting.  Genitourinary:  Negative for hematuria.  Musculoskeletal:  Negative for falls and myalgias.  Skin:  Negative for rash.  Neurological:  Positive for weakness. Negative for dizziness, tingling, tremors, sensory change, speech change, focal weakness and loss of consciousness.  Endo/Heme/Allergies:  Does not bruise/bleed easily.  Psychiatric/Behavioral:  Negative for  substance abuse. The patient is not nervous/anxious.   All other systems reviewed and are negative.     Physical Exam/Data:   Vitals:   03/01/24 0845 03/01/24 0855 03/01/24 0900 03/01/24 0958  BP: (!) 93/56 95/62  99/64  Pulse:    70  Resp: 19 18  17   Temp:    98.2 F (36.8 C)  TempSrc:    Oral  SpO2:    97%  Weight:      Height:   5' 9 (1.753 m)    No intake or output data in the 24 hours ending 03/01/24 1002 Filed Weights   02/29/24 1437  Weight: 94.8 kg   Body mass index is 30.86 kg/m.   Physical Exam: General:  Well developed, well nourished, in no acute distress. Head: Normocephalic, atraumatic, sclera non-icteric, no xanthomas, nares without discharge.  Neck: Negative for carotid bruits. JVD elevated to the angle of the mandible. Lungs: Clear bilaterally to auscultation without wheezes, rales, or rhonchi. Breathing is unlabored. Heart: RRR with S1 S2. No murmurs, rubs, or gallops appreciated. Abdomen: Soft, non-tender, non-distended with normoactive bowel sounds. No hepatomegaly. No rebound/guarding. No obvious abdominal masses. Msk:  Strength and tone appear normal for age. Extremities: No clubbing or cyanosis.  1-2+ bilateral lower extremity edema to the knees.  Neuro: Alert and oriented X 3. No facial asymmetry. No focal deficit. Moves all extremities spontaneously. Psych:  Responds to questions appropriately with a normal affect.   EKG:  The EKG was personally reviewed and demonstrates: NSR, 71 bpm, first-degree AV block, RBBB, nonspecific ST-T changes Telemetry:  Telemetry was personally reviewed and demonstrates: Sinus rhythm, 70s to 80s bpm  Weights: Filed Weights   02/29/24 1437  Weight: 94.8 kg    Relevant CV Studies:  TEE 01/03/2024: 1. Left ventricular ejection fraction, by estimation, is 30 to 35%. The  left ventricle has moderately decreased function. The left ventricle  demonstrates global hypokinesis.   2. Right ventricular systolic function  is normal. The right ventricular  size is normal.   3. Left atrial size was severely dilated. No left atrial/left atrial  appendage thrombus was detected.   4. The mitral valve is normal in structure. Mild mitral valve  regurgitation. No evidence of mitral stenosis.   5. The aortic valve is tricuspid. Aortic valve regurgitation is not  visualized. No aortic stenosis is present.   6. There is mild (Grade II) atheroma plaque involving the aortic arch and  descending aorta.   7. The inferior vena cava is normal in size with greater than 50%  respiratory variability, suggesting right atrial pressure of 3 mmHg.   8. Agitated saline contrast bubble study was positive with shunting  observed within 3-6 cardiac cycles suggestive of interatrial shunt. There  is a small patent foramen ovale with predominantly left to right shunting  across the atrial septum.   Cardioversion for atrial fibrillation with RVR to follow: normal sinus  rhythm restored  __________  2D echo 12/22/2023: 1. Left ventricular ejection fraction, by estimation, is 55 to 60%. Left  ventricular ejection fraction by PLAX is 66 %. The left ventricle has  normal function. The left ventricle has no regional wall motion  abnormalities. There is mild left ventricular  hypertrophy. Left ventricular diastolic parameters are indeterminate.   2. Right ventricular systolic function is normal. The right ventricular  size is normal.   3. The mitral valve is normal in structure. No evidence of mitral valve  regurgitation.   4. The aortic valve was not well visualized. Aortic valve regurgitation  is not visualized.  __________  2D echo 04/27/2022: 1. Left ventricular ejection fraction, by estimation, is 55 to 60%. The  left ventricle has normal function. The left ventricle has no regional  wall motion abnormalities. Left ventricular diastolic parameters are  consistent with Grade II diastolic  dysfunction (pseudonormalization). The  average left ventricular global  longitudinal strain is -19.4 %. The global longitudinal strain is normal.   2. Right ventricular systolic function is normal. The right ventricular  size is normal.   3. The mitral valve is normal in structure. No evidence of mitral valve  regurgitation.   4. The aortic valve is tricuspid. Aortic valve regurgitation is trivial.  __________  Zio patch 07/2021: The patient was monitored for 13 days, 19 hours. The predominant rhythm was sinus with an average rate of 63 bpm (range 50-102 bpm in sinus). There were rare PACs and PVCs. One episode of nonsustained ventricular tachycardia was observed, lasting 5 beats with a maximum rate of 160 bpm. There were 78 atrial runs lasting up to 15 beats with a maximum rate of 171 bpm. No sustained arrhythmia (including atrial fibrillation/flutter) or prolonged pause was observed. Patient triggered event corresponds to sinus rhythm.   Predominantly sinus rhythm with rare PACs and PVCs.  Multiple episodes of brief PSVT noted as well as one short run of NSVT.  No atrial fibrillation/flutter identified. __________   2D echo 06/26/2021: 1. Left ventricular ejection fraction, by estimation, is 45 to 50%. The  left ventricle has mildly decreased function. The left ventricle  demonstrates global hypokinesis. There is moderate concentric left  ventricular hypertrophy. Left ventricular  diastolic parameters are indeterminate. Elevated left ventricular  end-diastolic pressure.   2. Right ventricular systolic function is normal. The right ventricular  size is normal. There is normal pulmonary artery systolic pressure.   3. Left atrial size was mildly dilated.   4. The mitral valve is normal in structure. Mild mitral valve  regurgitation. No evidence of mitral stenosis.   5. The aortic valve is tricuspid. There is mild calcification of the  aortic valve. Aortic valve regurgitation is not visualized. No aortic  stenosis is  present.   6. Aortic dilatation noted. There is mild dilatation of the aortic root,  measuring 41 mm. There is mild dilatation of the ascending aorta,  measuring 39 mm.   7. The inferior vena cava is dilated in size with <50% respiratory  variability, suggesting right atrial pressure of 15 mmHg. __________   Zio patch 06/2019: The patient was monitored for 13 days, 19 hours. The predominant rhythm was sinus with an average rate of 74 bpm (range 54 to 120 bpm in sinus). Occasional PACs (~4% burden) and rare PVCs were noted. There were 125 episodes of paroxysmal supraventricular tachycardia lasting up to 17.5 seconds with a maximum rate of 187 bpm. No sustained arrhythmia or prolonged pause was identified. There were no patient triggered events.   Predominantly sinus rhythm with occasional PACs and rare PVCs.  Multiple episodes of PSVT noted, lasting up to 17.5 seconds. __________   2D echo 06/09/2019: 1. Left ventricular ejection fraction, by estimation, is 55 to 60%. The  left ventricle has normal function. The left ventricle has no regional  wall motion abnormalities. Left ventricular diastolic parameters were  normal.   2. Right ventricular systolic function is normal. The right ventricular  size is normal.   3. The mitral valve is normal in structure and function. Trivial mitral  valve regurgitation.   4. The aortic valve is normal in structure and function. Aortic valve  regurgitation is not visualized. __________   2D echo 03/29/2018: - Left ventricle: The cavity size was normal. Systolic function was    normal. The estimated ejection fraction was in the range of 60%    to 65%. Wall motion was normal; there were no regional wall    motion abnormalities. Left ventricular diastolic function    parameters were normal.  - Aortic valve: There was mild regurgitation.  - Left atrium: The atrium was mildly dilated.  - Right ventricle: Systolic function was normal.  - Atrial  septum: Suspected small patent foramen ovale by color flow    Doppler. SABRA  -  Pulmonary arteries: Systolic pressure was within the normal    range. __________   2D echo 10/05/2015: - Left ventricle: The cavity size was mildly dilated. There was    mild concentric hypertrophy. Systolic function was normal. The    estimated ejection fraction was in the range of 50% to 55%. Wall    motion was normal; there were no regional wall motion    abnormalities. Left ventricular diastolic function parameters    were normal.  - Aortic valve: There was trivial regurgitation.  - Mitral valve: There was mild regurgitation.  - Left atrium: The atrium was mildly dilated. __________   Lexiscan  MPI 10/02/2015: There was no ST segment deviation noted during stress. No T wave inversion was noted during stress. The study is normal. This is a low risk study. The left ventricular ejection fraction is mildly decreased (45-54%). EF appears close to normal visually. recommend an echocardiogram  Laboratory Data:  Chemistry Recent Labs  Lab 02/29/24 1442 03/01/24 0454  NA 137 137  K 4.5 3.9  CL 100 99  CO2 28 30  GLUCOSE 132* 95  BUN 24* 23  CREATININE 2.37* 2.22*  CALCIUM  9.0 9.0  GFRNONAA 27* 29*  ANIONGAP 9 8    No results for input(s): PROT, ALBUMIN , AST, ALT, ALKPHOS, BILITOT in the last 168 hours. Hematology Recent Labs  Lab 02/29/24 1442 02/29/24 2250 03/01/24 0454  WBC 7.3 7.2 6.2  RBC 3.04*  3.01* 3.17* 2.95*  HGB 8.5* 8.7* 8.2*  HCT 28.1* 28.7* 26.5*  MCV 92.4 90.5 89.8  MCH 28.0 27.4 27.8  MCHC 30.2 30.3 30.9  RDW 14.9 14.8 15.0  PLT 249 224 231   Cardiac EnzymesNo results for input(s): TROPONINI in the last 168 hours. No results for input(s): TROPIPOC in the last 168 hours.  BNP Recent Labs  Lab 02/29/24 1442  PROBNP 1,845.0*    DDimer No results for input(s): DDIMER in the last 168 hours.  Radiology/Studies:  DG Chest 2 View Result Date:  02/29/2024 IMPRESSION: No active cardiopulmonary disease. Electronically Signed   By: Vanetta Chou M.D.   On: 02/29/2024 15:39    Assessment and Plan:   1.  Acute on chronic HFrEF secondary to NICM: - Remains volume up - Continue diuresis with IV Lasix  60 mg twice daily with KCl repletion - GDMT on hold at this time with hypotension, as this improves would look to reinitiate GDMT - Obtain limited echo to evaluate LV systolic function now that he is back in sinus rhythm - Pending renal function trend and diuresis, may need to consider RHC early next week to further assess hemodynamics and guide pharmacotherapy - Obtain LFTs - Daily weights with strict I's and O's  2.  Persistent A-fib: - Maintaining sinus rhythm following recent TEE guided DCCV - Remains on amiodarone  200 mg daily under the direction of EP - CHA2DS2-VASc at least 7 (CHF, HTN, age x 2, CVA x 2, vascular disease) - Remains on apixaban  2.5 mg twice daily (age and serum creatinine), will need to monitor anemia  3.  Acute on CKD stage IIIb: - Likely secondary to hypervolemia, cannot exclude some degree of cardiorenal syndrome - Improving with IV diuresis - Continue to monitor - No current indication for inotropic support to augment diuresis  4.  Normocytic anemia: - Over the past several months, his hemoglobin has been slowly downtrending to a current value of 8.2 - Denies symptoms of bleeding - Remains on apixaban  - No current indication for transfusion -  Possibly contributing to overall presentation - Workup per internal medicine  5.  HTN: - BPs have been running soft leading to the de-escalation of GDMT as outlined above - Stable - Monitor with diuresis  6.  Alcohol/tobacco use: - Complete cessation recommended - Obtain LFTs  7.  OSA: - Recommend CPAP at night while admitted  8.  HLD: - LDL 68 in 12/2022 - PTA atorvastatin  40 mg     For questions or updates, please contact [facility] Please  consult [email or web address] for contact info under Cardiology/STEMI.   Signed, Bernardino Bring, PA-C [facility] Pager: [phone] 03/01/2024, 10:02 AM

## 2024-03-01 NOTE — Telephone Encounter (Signed)
 Copied from CRM #[id number]. Topic: Clinical - Home Health Verbal Orders >> Feb 29, 2024  3:47 PM Rosaria BRAVO wrote: Caller/Agency: [facility]  Callback Number: [phone] Service Requested: Physical Therapy Frequency:   Continue PT 1w9  Any new concerns about the patient? No

## 2024-03-01 NOTE — Procedures (Deleted)
 Please delay echo --per Bernardino Bring [facility] heart PAC.  Pt has rapid heart rate in 160 Bpm range

## 2024-03-01 NOTE — Progress Notes (Signed)
*  PRELIMINARY RESULTS* Echocardiogram 2D Echocardiogram has been performed.  Cameron Foster 03/01/2024, 10:53 AM

## 2024-03-01 NOTE — Progress Notes (Signed)
 Heart Failure Navigator Progress Note  Patient is a current Advanced Heart Failure Team patient. He has an upcoming scheduled follow-up already on 03/11/24 @ 1:30 PM.  Appointment details included on patient AVS for discharge. Navigator available for reassessment of patient but will sign off at this time.  Charmaine Pines, RN, BSN [facility] Heart Failure Navigator Secure Chat Only

## 2024-03-02 DIAGNOSIS — I4819 Other persistent atrial fibrillation: Secondary | ICD-10-CM | POA: Diagnosis not present

## 2024-03-02 DIAGNOSIS — I5023 Acute on chronic systolic (congestive) heart failure: Secondary | ICD-10-CM | POA: Diagnosis not present

## 2024-03-02 DIAGNOSIS — N179 Acute kidney failure, unspecified: Secondary | ICD-10-CM | POA: Diagnosis not present

## 2024-03-02 DIAGNOSIS — I3139 Other pericardial effusion (noninflammatory): Secondary | ICD-10-CM

## 2024-03-02 DIAGNOSIS — I10 Essential (primary) hypertension: Secondary | ICD-10-CM | POA: Diagnosis not present

## 2024-03-02 LAB — COMPREHENSIVE METABOLIC PANEL WITH GFR
ALT: 12 U/L (ref 0–44)
AST: 21 U/L (ref 15–41)
Albumin: 3.3 g/dL — ABNORMAL LOW (ref 3.5–5.0)
Alkaline Phosphatase: 68 U/L (ref 38–126)
Anion gap: 6 (ref 5–15)
BUN: 22 mg/dL (ref 8–23)
CO2: 31 mmol/L (ref 22–32)
Calcium: 8.8 mg/dL — ABNORMAL LOW (ref 8.9–10.3)
Chloride: 99 mmol/L (ref 98–111)
Creatinine, Ser: 2.02 mg/dL — ABNORMAL HIGH (ref 0.61–1.24)
GFR, Estimated: 32 mL/min — ABNORMAL LOW (ref >60)
Glucose, Bld: 94 mg/dL (ref 70–99)
Potassium: 3.6 mmol/L (ref 3.5–5.1)
Sodium: 136 mmol/L (ref 135–145)
Total Bilirubin: 0.5 mg/dL (ref 0.0–1.2)
Total Protein: 6.3 g/dL — ABNORMAL LOW (ref 6.5–8.1)

## 2024-03-02 LAB — RETICULOCYTES
Immature Retic Fract: 13.2 % (ref 2.3–15.9)
RBC.: 2.81 MIL/uL — ABNORMAL LOW (ref 4.22–5.81)
Retic Count, Absolute: 44.1 K/uL (ref 19.0–186.0)
Retic Ct Pct: 1.6 % (ref 0.4–3.1)

## 2024-03-02 LAB — FOLATE: Folate: >20 ng/mL (ref >5.9)

## 2024-03-02 LAB — CBC
HCT: 24.6 % — ABNORMAL LOW (ref 39.0–52.0)
Hemoglobin: 7.8 g/dL — ABNORMAL LOW (ref 13.0–17.0)
MCH: 27.6 pg (ref 26.0–34.0)
MCHC: 31.7 g/dL (ref 30.0–36.0)
MCV: 86.9 fL (ref 80.0–100.0)
Platelets: 228 K/uL (ref 150–400)
RBC: 2.83 MIL/uL — ABNORMAL LOW (ref 4.22–5.81)
RDW: 14.9 % (ref 11.5–15.5)
WBC: 5.6 K/uL (ref 4.0–10.5)
nRBC: 0 % (ref 0.0–0.2)

## 2024-03-02 NOTE — Progress Notes (Signed)
 Progress Note  Patient Name: Cameron Foster. Date of Encounter: 03/02/2024  Primary Cardiologist: End  Subjective   Echo on 11/14 showed recovery of LV systolic function with a new moderate pericardial effusion without evidence of tamponade. This morning, he remains hemodynamically stable. Documented UOP 1.8 L for the admission. Renal function improving with diuresis. Hgb trend from 8.0 to 7.8.   Dyspnea and lower extremity swelling improving. No chest pain, dizziness, presyncope, or syncope.   Inpatient Medications    Scheduled Meds:  amiodarone   200 mg Oral Daily   atorvastatin   40 mg Oral Daily   cholecalciferol  1,000 Units Oral Daily   cyanocobalamin   1,000 mcg Oral Daily   folic acid   1 mg Oral Daily   furosemide   60 mg Intravenous Q12H   loratadine   10 mg Oral Daily   pantoprazole   40 mg Oral Daily   sertraline   100 mg Oral Daily   thiamine   100 mg Oral Daily   zinc sulfate (50mg  elemental zinc)  220 mg Oral Daily   Continuous Infusions:  PRN Meds: acetaminophen , albuterol , dextromethorphan -guaiFENesin , fluticasone , hydrALAZINE , ondansetron  (ZOFRAN ) IV   Vital Signs    Vitals:   03/01/24 2358 03/02/24 0500 03/02/24 0506 03/02/24 0752  BP: (!) 110/94  111/72 118/70  Pulse: 91  79 78  Resp: (!) 21  20 16   Temp: 98.7 F (37.1 C)  98.7 F (37.1 C) 98.1 F (36.7 C)  TempSrc: Oral  Oral   SpO2: 96%  94% 95%  Weight:  94.9 kg    Height:        Intake/Output Summary (Last 24 hours) at 03/02/2024 1028 Last data filed at 03/02/2024 1000 Gross per 24 hour  Intake 240 ml  Output 2050 ml  Net -1810 ml   Filed Weights   02/29/24 1437 03/02/24 0500  Weight: 94.8 kg 94.9 kg    Telemetry    SR with artifact, 70s to 80s bpm - Personally Reviewed  ECG    No new tracings - Personally Reviewed  Physical Exam   GEN: No acute distress.   Neck: JVD elevated ~ 8 cm. Cardiac: RRR, no murmurs, rubs, or gallops.  Respiratory: Clear to auscultation  bilaterally.  GI: Soft, nontender, non-distended.   MS: Improving lower extremity edema with SCDs in place; No deformity. Neuro:  Alert and oriented x 3; Nonfocal.  Psych: Normal affect.  Labs    Chemistry Recent Labs  Lab 02/29/24 1442 03/01/24 0454 03/02/24 0538  NA 137 137 136  K 4.5 3.9 3.6  CL 100 99 99  CO2 28 30 31   GLUCOSE 132* 95 94  BUN 24* 23 22  CREATININE 2.37* 2.22* 2.02*  CALCIUM  9.0 9.0 8.8*  PROT  --   --  6.3*  ALBUMIN   --   --  3.3*  AST  --   --  21  ALT  --   --  12  ALKPHOS  --   --  68  BILITOT  --   --  0.5  GFRNONAA 27* 29* 32*  ANIONGAP 9 8 6      Hematology Recent Labs  Lab 03/01/24 0454 03/01/24 1825 03/02/24 0538  WBC 6.2 7.5 5.6  RBC 2.95* 2.90* 2.83*  2.81*  HGB 8.2* 8.0* 7.8*  HCT 26.5* 25.7* 24.6*  MCV 89.8 88.6 86.9  MCH 27.8 27.6 27.6  MCHC 30.9 31.1 31.7  RDW 15.0 14.9 14.9  PLT 231 220 228    Cardiac EnzymesNo results  for input(s): TROPONINI in the last 168 hours. No results for input(s): TROPIPOC in the last 168 hours.   BNP Recent Labs  Lab 02/29/24 1442  PROBNP 1,845.0*     DDimer No results for input(s): DDIMER in the last 168 hours.   Radiology    DG Chest 2 View Result Date: 02/29/2024 IMPRESSION: No active cardiopulmonary disease. Electronically Signed   By: Vanetta Chou M.D.   On: 02/29/2024 15:39    Cardiac Studies   Limited echo 03/01/2024: 1. Left ventricular ejection fraction, by estimation, is 60 to 65%. The  left ventricle has normal function. Left ventricular endocardial border  not optimally defined to evaluate regional wall motion. There is moderate  left ventricular hypertrophy. Left  ventricular diastolic parameters are indeterminate.   2. Right ventricular systolic function is normal. The right ventricular  size is normal. Tricuspid regurgitation signal is inadequate for assessing  PA pressure.   3. Moderate pericardial effusion. The pericardial effusion is   circumferential. No RV diastolic collapse visualized. Mitral and tricuspid  inflow not performed.   4. The inferior vena cava is dilated in size with <50% respiratory  variability, suggesting right atrial pressure of 15 mmHg.   Comparison(s): A prior study was performed on 12/22/2023. There is now  evidence of a moderate-sized pericardial effusion.  __________  TEE 01/03/2024: 1. Left ventricular ejection fraction, by estimation, is 30 to 35%. The  left ventricle has moderately decreased function. The left ventricle  demonstrates global hypokinesis.   2. Right ventricular systolic function is normal. The right ventricular  size is normal.   3. Left atrial size was severely dilated. No left atrial/left atrial  appendage thrombus was detected.   4. The mitral valve is normal in structure. Mild mitral valve  regurgitation. No evidence of mitral stenosis.   5. The aortic valve is tricuspid. Aortic valve regurgitation is not  visualized. No aortic stenosis is present.   6. There is mild (Grade II) atheroma plaque involving the aortic arch and  descending aorta.   7. The inferior vena cava is normal in size with greater than 50%  respiratory variability, suggesting right atrial pressure of 3 mmHg.   8. Agitated saline contrast bubble study was positive with shunting  observed within 3-6 cardiac cycles suggestive of interatrial shunt. There  is a small patent foramen ovale with predominantly left to right shunting  across the atrial septum.   Cardioversion for atrial fibrillation with RVR to follow: normal sinus  rhythm restored  __________   2D echo 12/22/2023: 1. Left ventricular ejection fraction, by estimation, is 55 to 60%. Left  ventricular ejection fraction by PLAX is 66 %. The left ventricle has  normal function. The left ventricle has no regional wall motion  abnormalities. There is mild left ventricular  hypertrophy. Left ventricular diastolic parameters are indeterminate.    2. Right ventricular systolic function is normal. The right ventricular  size is normal.   3. The mitral valve is normal in structure. No evidence of mitral valve  regurgitation.   4. The aortic valve was not well visualized. Aortic valve regurgitation  is not visualized.  __________   2D echo 04/27/2022: 1. Left ventricular ejection fraction, by estimation, is 55 to 60%. The  left ventricle has normal function. The left ventricle has no regional  wall motion abnormalities. Left ventricular diastolic parameters are  consistent with Grade II diastolic  dysfunction (pseudonormalization). The average left ventricular global  longitudinal strain is -19.4 %. The  global longitudinal strain is normal.   2. Right ventricular systolic function is normal. The right ventricular  size is normal.   3. The mitral valve is normal in structure. No evidence of mitral valve  regurgitation.   4. The aortic valve is tricuspid. Aortic valve regurgitation is trivial.  __________   Zio patch 07/2021: The patient was monitored for 13 days, 19 hours. The predominant rhythm was sinus with an average rate of 63 bpm (range 50-102 bpm in sinus). There were rare PACs and PVCs. One episode of nonsustained ventricular tachycardia was observed, lasting 5 beats with a maximum rate of 160 bpm. There were 78 atrial runs lasting up to 15 beats with a maximum rate of 171 bpm. No sustained arrhythmia (including atrial fibrillation/flutter) or prolonged pause was observed. Patient triggered event corresponds to sinus rhythm.   Predominantly sinus rhythm with rare PACs and PVCs.  Multiple episodes of brief PSVT noted as well as one short run of NSVT.  No atrial fibrillation/flutter identified. __________   2D echo 06/26/2021: 1. Left ventricular ejection fraction, by estimation, is 45 to 50%. The  left ventricle has mildly decreased function. The left ventricle  demonstrates global hypokinesis. There is moderate concentric  left  ventricular hypertrophy. Left ventricular  diastolic parameters are indeterminate. Elevated left ventricular  end-diastolic pressure.   2. Right ventricular systolic function is normal. The right ventricular  size is normal. There is normal pulmonary artery systolic pressure.   3. Left atrial size was mildly dilated.   4. The mitral valve is normal in structure. Mild mitral valve  regurgitation. No evidence of mitral stenosis.   5. The aortic valve is tricuspid. There is mild calcification of the  aortic valve. Aortic valve regurgitation is not visualized. No aortic  stenosis is present.   6. Aortic dilatation noted. There is mild dilatation of the aortic root,  measuring 41 mm. There is mild dilatation of the ascending aorta,  measuring 39 mm.   7. The inferior vena cava is dilated in size with <50% respiratory  variability, suggesting right atrial pressure of 15 mmHg. __________   Zio patch 06/2019: The patient was monitored for 13 days, 19 hours. The predominant rhythm was sinus with an average rate of 74 bpm (range 54 to 120 bpm in sinus). Occasional PACs (~4% burden) and rare PVCs were noted. There were 125 episodes of paroxysmal supraventricular tachycardia lasting up to 17.5 seconds with a maximum rate of 187 bpm. No sustained arrhythmia or prolonged pause was identified. There were no patient triggered events.   Predominantly sinus rhythm with occasional PACs and rare PVCs.  Multiple episodes of PSVT noted, lasting up to 17.5 seconds. __________   2D echo 06/09/2019: 1. Left ventricular ejection fraction, by estimation, is 55 to 60%. The  left ventricle has normal function. The left ventricle has no regional  wall motion abnormalities. Left ventricular diastolic parameters were  normal.   2. Right ventricular systolic function is normal. The right ventricular  size is normal.   3. The mitral valve is normal in structure and function. Trivial mitral  valve  regurgitation.   4. The aortic valve is normal in structure and function. Aortic valve  regurgitation is not visualized. __________   2D echo 03/29/2018: - Left ventricle: The cavity size was normal. Systolic function was    normal. The estimated ejection fraction was in the range of 60%    to 65%. Wall motion was normal; there were no regional wall  motion abnormalities. Left ventricular diastolic function    parameters were normal.  - Aortic valve: There was mild regurgitation.  - Left atrium: The atrium was mildly dilated.  - Right ventricle: Systolic function was normal.  - Atrial septum: Suspected small patent foramen ovale by color flow    Doppler. .  - Pulmonary arteries: Systolic pressure was within the normal    range. __________   2D echo 10/05/2015: - Left ventricle: The cavity size was mildly dilated. There was    mild concentric hypertrophy. Systolic function was normal. The    estimated ejection fraction was in the range of 50% to 55%. Wall    motion was normal; there were no regional wall motion    abnormalities. Left ventricular diastolic function parameters    were normal.  - Aortic valve: There was trivial regurgitation.  - Mitral valve: There was mild regurgitation.  - Left atrium: The atrium was mildly dilated. __________   Lexiscan  MPI 10/02/2015: There was no ST segment deviation noted during stress. No T wave inversion was noted during stress. The study is normal. This is a low risk study. The left ventricular ejection fraction is mildly decreased (45-54%). EF appears close to normal visually. recommend an echocardiogram  Patient Profile     83 y.o. male with history of HFrEF, PAF, CKD stage IIIa, anemia, CVA, subarachnoid hemorrhage, HTN, HLD, colitis with prior GI bleeding, prostate cancer, and alcohol and tobacco use who is being seen today for the evaluation of acute on chronic HFrEF at the request of Dr. Hilma.   Assessment & Plan    1. Acute on  chronic HF with recovered EF secondary to NICM and moderate pericardial effusion: - Remains volume up, though improving - Continue diuresis with IV Lasix  60 mg twice daily with KCl repletion - GDMT on hold at this time with hypotension, as this improves would look to reinitiate GDMT - Echo this admission demonstrated recovered LV systolic function with new moderate pericardial effusion without evidence of tamponade - Hemodynamically stable - No indication for emergent pericardiocentesis - Query if this is hemopericardium in the setting of anticoagulation  - Continue diuresis as above with recommendation for repeat limited echo in 24-48 hours, sooner if indicated  - Pending renal function trend and diuresis, may need to consider RHC early next week to further assess hemodynamics and guide pharmacotherapy - Daily weights with strict I's and O's   2.  Persistent A-fib: - Maintaining sinus rhythm following recent TEE guided DCCV in 12/2023 - Remains on amiodarone  200 mg daily under the direction of EP - CHA2DS2-VASc at least 7 (CHF, HTN, age x 2, CVA x 2, vascular disease) - Hold Eliquis  given concern for possible hemopericardium as above, and in the setting of anemia (has completed > 4 weeks of OAC from DCCV)   3.  Acute on CKD stage IIIb: - Likely secondary to hypervolemia - Improving with IV diuresis - Continue to monitor - No current indication for inotropic support to augment diuresis   4.  Normocytic anemia: - Over the past several months, his hemoglobin has been slowly downtrending to a current value of 8.2 - Denies symptoms of bleeding - Cannot exclude hemopericardium  - Apixaban  held as above - Monitor  - Maintain Hgb > 8 - Possibly contributing to overall presentation   5.  HTN: - BPs have been running soft leading to the de-escalation of GDMT as outlined above - Stable - Monitor with diuresis   6.  Alcohol/tobacco use: - Complete cessation recommended - LFT normal   7.   OSA: - Recommend CPAP at night while admitted   8.  HLD: - LDL 68 in 12/2022 - PTA atorvastatin  40 mg      For questions or updates, please contact [facility] Please consult [email or web address] for contact info under Cardiology/STEMI.    Signed, Bernardino Bring, PA-C [facility] Pager: [phone] 03/02/2024, 10:28 AM

## 2024-03-02 NOTE — Progress Notes (Signed)
 PROGRESS NOTE    [name]  FMW:[id number] DOB: 10-02-1940 DOA: 02/29/2024 PCP: Myrla Jon [name], MD  Chief Complaint  Patient presents with   Leg Swelling    Hospital Course:  Levent Kornegay. is a 83 y.o. male with medical history significant of CHF with EF 30 to 35%, stroke, depression with anxiety, hypertension, hyperlipidemia, prediabetes, CAD, CKD-3A, A-fib on Eliquis , right bundle blockade, IBS, obesity, alcohol use, who presents with anasarca and low blood pressure.  Was sent to ED from clinic.  Patient was admitted for acute on chronic heart failure.  Hospital course as below  Subjective: Patient was examined at the bedside, new to me today.  Reports feeling better today. Denies any complaints today   Objective: Vitals:   03/02/24 0506 03/02/24 0752 03/02/24 1141 03/02/24 1537  BP: 111/72 118/70 105/68 128/83  Pulse: 79 78 79 85  Resp: 20 16 16 16   Temp: 98.7 F (37.1 C) 98.1 F (36.7 C) 98.2 F (36.8 C) 97.9 F (36.6 C)  TempSrc: Oral     SpO2: 94% 95% 94% 96%  Weight:      Height:        Intake/Output Summary (Last 24 hours) at 03/02/2024 1712 Last data filed at 03/02/2024 1500 Gross per 24 hour  Intake 540 ml  Output 2300 ml  Net -1760 ml   Filed Weights   02/29/24 1437 03/02/24 0500  Weight: 94.8 kg 94.9 kg    Examination: General: Not in acute distress.  Has anasarca Neck: Difficult to assess JVD due to obesity, no bruit, no mass felt. Cardiac: S1/S2, RRR, No murmurs, No gallops or rubs. Respiratory: has fine crackles bilaterally GI: Soft, nondistended, nontender, no rebound pain, no organomegaly, BS present. GU: No hematuria Ext: 3+ pitting leg edema bilaterally. 1+DP/PT pulse bilaterally. Musculoskeletal: No joint deformities, No joint redness or warmth, no limitation of ROM in spin. Skin: No rashes.  Neuro: Alert, oriented X3, cranial nerves II-XII grossly intact, moves all extremities normally  Assessment & Plan:  Acute on  chronic systolic CHF (congestive heart failure) Moderate pericardial effusion - Limited Echo shows EF 60 to 65% moderate LVH.  Moderate pericardial effusion without tamponade, ? Hemopericardium in the setting of AC - Lasix  60 mg bid by IV - GDMT on hold due to hypotension - No indication for emergent pericardiocentesis, repeat limited echo in 24 to 48 hours - Seen by cardiology, appreciate recs - Daily weight, strict I/o's   Paroxysmal atrial fibrillation (HCC) - s/p TEE guided DCCV - Amiodarone  200mg  daily - CHA2DS2-VASc score 7, on Eliquis  - held due to concern for hemopericardium - Hold metoprolol  due to soft blood pressure   Essential hypertension: - Hold metoprolol  - Patient is on IV Lasix    CAD (coronary artery disease): No chest pain - Lipitor   Normocytic anemia: Hemoglobin 8.5 -> 7.8 (10.2 on 01/04/2024).  Patient denies rectal bleeding or dark stool. - Eliquis  on hold due to concern for hemopericardium - Iron panel with elevated ferritin consistent with ACD. Retic count normal, FA normal - B12, FOBT, UA pending   Hyperlipidemia LDL goal <70 -Lipitor   AKI on CKD stage 3a - Recent baseline creatinine 1.83 on 02/01/2024.  Likely due to cardiorenal syndrome - Cr 2.37 -> 2.02, improving - Avoid using renal toxic medications. - Follow-up by BMP   Depression with anxiety -Zoloft   OSA - CPAP at night  Alcohol and tobacco use - cessation counseling - FA, MV, B1   Obesity (  BMI 30-39.9): Patient has Obesity Class I, with body weight 94.8 Kg and BMI 30.86 kg/m2.  - Encourage losing weight - Exercise and healthy diet   DVT prophylaxis: SCD   Code Status: Full Code Disposition:  TBD  Consultants:  Treatment Team:  Consulting Physician: Mady Bruckner, MD Consulting Physician: Perla Evalene PARAS, MD  Procedures:  None  Antimicrobials:  Anti-infectives (From admission, onward)    None       Data Reviewed: I have personally reviewed following labs and  imaging studies CBC: Recent Labs  Lab 02/29/24 1442 02/29/24 2250 03/01/24 0454 03/01/24 1825 03/02/24 0538  WBC 7.3 7.2 6.2 7.5 5.6  HGB 8.5* 8.7* 8.2* 8.0* 7.8*  HCT 28.1* 28.7* 26.5* 25.7* 24.6*  MCV 92.4 90.5 89.8 88.6 86.9  PLT 249 224 231 220 228   Basic Metabolic Panel: Recent Labs  Lab 02/29/24 1442 03/01/24 0454 03/02/24 0538  NA 137 137 136  K 4.5 3.9 3.6  CL 100 99 99  CO2 28 30 31   GLUCOSE 132* 95 94  BUN 24* 23 22  CREATININE 2.37* 2.22* 2.02*  CALCIUM  9.0 9.0 8.8*  MG 2.2  --   --    GFR: Estimated Creatinine Clearance: 31.5 mL/min (A) (by C-G formula based on SCr of 2.02 mg/dL (H)). Liver Function Tests: Recent Labs  Lab 03/02/24 0538  AST 21  ALT 12  ALKPHOS 68  BILITOT 0.5  PROT 6.3*  ALBUMIN  3.3*   CBG: Recent Labs  Lab 02/29/24 2158  GLUCAP 123*    No results found for this or any previous visit (from the past 240 hours).   Radiology Studies: ECHOCARDIOGRAM LIMITED Result Date: 03/01/2024    ECHOCARDIOGRAM LIMITED REPORT   Patient Name:   Josafat Enrico. Date of Exam: 03/01/2024 Medical Rec #:  [id number]          Height:       69.0 in Accession #:    [id number]         Weight:       209.0 lb Date of Birth:  [birth year]          BSA:          2.105 m Patient Age:    83 years           BP:           99/64 mmHg Patient Gender: M                  HR:           70 bpm. Exam Location:  [facility] Procedure: Limited Echo, Cardiac Doppler and Color Doppler (Both Spectral and            Color Flow Doppler were utilized during procedure). Indications:     Cardiomyopathy--unspecified I42.9  History:         Patient has prior history of Echocardiogram examinations, most                  recent 01/03/2024. Arrythmias:Atrial Fibrillation.  Sonographer:     Christopher Furnace Referring Phys:  [id number] RYAN M DUNN Diagnosing Phys: Caron Poser IMPRESSIONS  1. Left ventricular ejection fraction, by estimation, is 60 to 65%. The left ventricle has normal function. Left  ventricular endocardial border not optimally defined to evaluate regional wall motion. There is moderate left ventricular hypertrophy. Left ventricular diastolic parameters are indeterminate.  2. Right ventricular systolic function is normal. The right ventricular size is normal. Tricuspid  regurgitation signal is inadequate for assessing PA pressure.  3. Moderate pericardial effusion. The pericardial effusion is circumferential. No RV diastolic collapse visualized. Mitral and tricuspid inflow not performed.  4. The inferior vena cava is dilated in size with <50% respiratory variability, suggesting right atrial pressure of 15 mmHg. Comparison(s): A prior study was performed on 12/22/2023. There is now evidence of a moderate-sized pericardial effusion. FINDINGS  Left Ventricle: Left ventricular ejection fraction, by estimation, is 60 to 65%. The left ventricle has normal function. Left ventricular endocardial border not optimally defined to evaluate regional wall motion. The left ventricular internal cavity size was normal in size. There is moderate left ventricular hypertrophy. Abnormal (paradoxical) septal motion, consistent with left bundle branch block. Left ventricular diastolic parameters are indeterminate. Left ventricular diastolic function could not be evaluated due to atrial fibrillation. Right Ventricle: The right ventricular size is normal. Right vetricular wall thickness was not well visualized. Right ventricular systolic function is normal. Tricuspid regurgitation signal is inadequate for assessing PA pressure. Left Atrium: Left atrial size was not assessed. Right Atrium: Right atrial size was not assessed. Pericardium: A moderately sized pericardial effusion is present. The pericardial effusion is circumferential. The pericardial effusion appears to contain mixed echogenic material. Mitral Valve: The mitral valve is degenerative in appearance. There is mild calcification of the mitral valve leaflet(s).  Moderate mitral annular calcification. No evidence of mitral valve stenosis. Tricuspid Valve: The tricuspid valve is not well visualized. Tricuspid valve regurgitation is not demonstrated. No evidence of tricuspid stenosis. Aortic Valve: The aortic valve is tricuspid. There is moderate calcification of the aortic valve. Aortic valve regurgitation is not visualized. Aortic valve sclerosis/calcification is present, without any evidence of aortic stenosis. Pulmonic Valve: The pulmonic valve was not well visualized. Pulmonic valve regurgitation is trivial. No evidence of pulmonic stenosis. Aorta: Aortic root could not be assessed. Venous: The inferior vena cava is dilated in size with less than 50% respiratory variability, suggesting right atrial pressure of 15 mmHg. IAS/Shunts: The interatrial septum was not assessed. Additional Comments: Spectral Doppler performed. Color Doppler performed.  LEFT VENTRICLE PLAX 2D LVIDd:         4.80 cm LVIDs:         3.20 cm LV PW:         1.50 cm LV IVS:        1.10 cm LVOT diam:     2.20 cm LVOT Area:     3.80 cm  RIGHT VENTRICLE RV Basal diam:  2.90 cm RV Mid diam:    2.60 cm LEFT ATRIUM         Index LA diam:    4.70 cm 2.23 cm/m   AORTA Ao Root diam: 2.80 cm  SHUNTS Systemic Diam: 2.20 cm Caron Poser Electronically signed by Caron Poser Signature Date/Time: 03/01/2024/1:08:59 PM    Final     Scheduled Meds:  amiodarone   200 mg Oral Daily   atorvastatin   40 mg Oral Daily   cholecalciferol  1,000 Units Oral Daily   cyanocobalamin   1,000 mcg Oral Daily   folic acid   1 mg Oral Daily   furosemide   60 mg Intravenous Q12H   loratadine   10 mg Oral Daily   pantoprazole   40 mg Oral Daily   sertraline   100 mg Oral Daily   thiamine   100 mg Oral Daily   zinc sulfate (50mg  elemental zinc)  220 mg Oral Daily   Continuous Infusions:   LOS: 2 days  MDM: Patient is high  risk for one or more organ failure.  They necessitate ongoing hospitalization for continued IV  therapies and subsequent lab monitoring. Total time spent interpreting labs and vitals, reviewing the medical record, coordinating care amongst consultants and care team members, directly assessing and discussing care with the patient and/or family: 55 min Laree Lock, MD [facility]  To contact the attending physician between 7A-7P please use Epic Chat. To contact the covering physician during after hours 7P-7A, please review Amion.  03/02/2024, 5:12 PM   *This document has been created with the assistance of dictation software. Please excuse typographical errors. *

## 2024-03-03 ENCOUNTER — Inpatient Hospital Stay (HOSPITAL_COMMUNITY)
Admit: 2024-03-03 | Discharge: 2024-03-03 | Disposition: A | Discharge status: Home / Self Care | Attending: Cardiovascular Disease | Admitting: Cardiovascular Disease

## 2024-03-03 DIAGNOSIS — I5020 Unspecified systolic (congestive) heart failure: Secondary | ICD-10-CM | POA: Diagnosis not present

## 2024-03-03 DIAGNOSIS — E785 Hyperlipidemia, unspecified: Secondary | ICD-10-CM | POA: Diagnosis not present

## 2024-03-03 DIAGNOSIS — I3139 Other pericardial effusion (noninflammatory): Secondary | ICD-10-CM | POA: Diagnosis not present

## 2024-03-03 DIAGNOSIS — I4819 Other persistent atrial fibrillation: Secondary | ICD-10-CM | POA: Diagnosis not present

## 2024-03-03 DIAGNOSIS — N189 Chronic kidney disease, unspecified: Secondary | ICD-10-CM | POA: Diagnosis not present

## 2024-03-03 DIAGNOSIS — I5023 Acute on chronic systolic (congestive) heart failure: Secondary | ICD-10-CM | POA: Diagnosis not present

## 2024-03-03 DIAGNOSIS — N179 Acute kidney failure, unspecified: Secondary | ICD-10-CM | POA: Diagnosis not present

## 2024-03-03 LAB — URINALYSIS, ROUTINE W REFLEX MICROSCOPIC
Bilirubin Urine: NEGATIVE
Glucose, UA: NEGATIVE mg/dL
Hgb urine dipstick: NEGATIVE
Ketones, ur: NEGATIVE mg/dL
Leukocytes,Ua: NEGATIVE
Nitrite: NEGATIVE
Protein, ur: NEGATIVE mg/dL
Specific Gravity, Urine: 1.01 (ref 1.005–1.030)
pH: 5 (ref 5.0–8.0)

## 2024-03-03 LAB — BASIC METABOLIC PANEL WITH GFR
Anion gap: 9 (ref 5–15)
BUN: 20 mg/dL (ref 8–23)
CO2: 31 mmol/L (ref 22–32)
Calcium: 8.8 mg/dL — ABNORMAL LOW (ref 8.9–10.3)
Chloride: 95 mmol/L — ABNORMAL LOW (ref 98–111)
Creatinine, Ser: 1.86 mg/dL — ABNORMAL HIGH (ref 0.61–1.24)
GFR, Estimated: 35 mL/min — ABNORMAL LOW (ref >60)
Glucose, Bld: 99 mg/dL (ref 70–99)
Potassium: 3.5 mmol/L (ref 3.5–5.1)
Sodium: 135 mmol/L (ref 135–145)

## 2024-03-03 LAB — VITAMIN B12: Vitamin B-12: 497 pg/mL (ref 180–914)

## 2024-03-03 LAB — HEMOGLOBIN: Hemoglobin: 8.1 g/dL — ABNORMAL LOW (ref 13.0–17.0)

## 2024-03-03 LAB — MAGNESIUM: Magnesium: 2.2 mg/dL (ref 1.7–2.4)

## 2024-03-03 MED ORDER — POTASSIUM CHLORIDE CRYS ER 20 MEQ PO TBCR
40.0000 meq | EXTENDED_RELEASE_TABLET | Freq: Once | ORAL | Status: AC
  Administered 2024-03-03: 40 meq via ORAL
  Filled 2024-03-03: qty 2

## 2024-03-03 MED ORDER — MELATONIN 5 MG PO TABS
5.0000 mg | ORAL_TABLET | Freq: Every evening | ORAL | Status: DC | PRN
  Administered 2024-03-03 – 2024-03-11 (×9): 5 mg via ORAL
  Filled 2024-03-03 (×9): qty 1

## 2024-03-03 NOTE — Evaluation (Signed)
 Occupational Therapy Evaluation Patient Details Name: Cameron Foster. MRN: [id number] DOB: [birth year] Today's Date: 03/03/2024   History of Present Illness   Pt is an 83 y/o M admitted on 02/29/24 after presenting with c/o worsening LE edema, abdominal swelling, referred from cardiology office with anasarca & low BP. Pt is being treated for acute on chronic systolic CHF. PMH: CHF, stroke, depression, anxiety, HTN, HLD, prediabetes, CAD, CKD3A, a-fib on Eliquis , R bundle blockade, IBS, obesity, alcohol use, SAH, prostate CA, colitis     Clinical Impressions Patient presenting with decreased Ind in self care,balance, functional mobility, transfers, endurance, and safety awareness. Patient reports being Mod I at baseline and living at home alone. He does have an aide that comes in 7 days/wk to assist with self care and IADLs.  Patient endorses not moving much once aide is gone. He ambulated outside of room with RW and CGA but reports dizziness and returns to room for safety and to check BP. Vitals are all WFLs. Pt was getting HHOT and HHPT prior to this admission.  Patient will benefit from acute OT to increase overall independence in the areas of ADLs, functional mobility, and safety awareness in order to safely discharge.     If plan is discharge home, recommend the following:   A little help with walking and/or transfers;A little help with bathing/dressing/bathroom;Assistance with cooking/housework;Assist for transportation;Help with stairs or ramp for entrance     Functional Status Assessment   Patient has had a recent decline in their functional status and demonstrates the ability to make significant improvements in function in a reasonable and predictable amount of time.     Equipment Recommendations   None recommended by OT      Precautions/Restrictions   Precautions Precautions: Fall Restrictions Weight Bearing Restrictions Per Provider Order: No     Mobility Bed  Mobility               General bed mobility comments: NT    Transfers Overall transfer level: Needs assistance Equipment used: Rolling walker (2 wheels) Transfers: Sit to/from Stand Sit to Stand: Contact guard assist                  Balance Overall balance assessment: Needs assistance Sitting-balance support: Feet supported Sitting balance-Leahy Scale: Good     Standing balance support: During functional activity, Bilateral upper extremity supported, Reliant on assistive device for balance Standing balance-Leahy Scale: Fair                             ADL either performed or assessed with clinical judgement   ADL Overall ADL's : Needs assistance/impaired                         Toilet Transfer: Contact guard assist;Rolling walker (2 wheels) Toilet Transfer Details (indicate cue type and reason): simulated                 Vision Patient Visual Report: No change from baseline              Pertinent Vitals/Pain Pain Assessment Pain Assessment: No/denies pain     Extremity/Trunk Assessment Upper Extremity Assessment Upper Extremity Assessment: Overall WFL for tasks assessed;Generalized weakness   Lower Extremity Assessment Lower Extremity Assessment: Generalized weakness       Communication Communication Communication: No apparent difficulties   Cognition Arousal: Alert Behavior During Therapy: WFL for tasks assessed/performed  Cognition: No apparent impairments                               Following commands: Intact       Cueing  General Comments   Cueing Techniques: Verbal cues  Pt on room air, BP sitting after gait: 102/71 mmHg MAP 94, standing at 0: 106/77 mmHg MAP 96           Home Living Family/patient expects to be discharged to:: Private residence Living Arrangements: Alone Available Help at Discharge: Personal care attendant Type of Home: House Home Access: Level entry     Home  Layout: One level     Bathroom Shower/Tub: Producer, Television/film/video: Standard     Home Equipment: Rollator (4 wheels);Rolling Walker (2 wheels);Cane - single point;Shower seat   Additional Comments: Pt reports he has a PCA 4 hours/day 7 days/week & a 2nd PCA 4 hours/day 2 days/week that assists him with bathing.      Prior Functioning/Environment Prior Level of Function : Needs assist;Driving             Mobility Comments: Pt reports he's ambulatory with RW, sister not letting pt drive for the past 6 month (she provides transportation), denies falls ADLs Comments: assistance for bathing & dressing    OT Problem List: Decreased strength;Decreased safety awareness;Decreased activity tolerance;Decreased knowledge of use of DME or AE;Impaired balance (sitting and/or standing);Cardiopulmonary status limiting activity;Pain   OT Treatment/Interventions: Self-care/ADL training;Therapeutic activities;Therapeutic exercise;Energy conservation;Patient/family education;Balance training      OT Goals(Current goals can be found in the care plan section)   Acute Rehab OT Goals Patient Stated Goal: to go home OT Goal Formulation: With patient Time For Goal Achievement: 03/17/24 Potential to Achieve Goals: Fair ADL Goals Pt Will Perform Grooming: with modified independence Pt Will Perform Lower Body Dressing: with contact guard assist;sit to/from stand Pt Will Transfer to Toilet: with contact guard assist;ambulating Pt Will Perform Toileting - Clothing Manipulation and hygiene: with contact guard assist;sit to/from stand   OT Frequency:  Min 2X/week       AM-PAC OT 6 Clicks Daily Activity     Outcome Measure Help from another person eating meals?: None Help from another person taking care of personal grooming?: A Little Help from another person toileting, which includes using toliet, bedpan, or urinal?: A Little Help from another person bathing (including washing,  rinsing, drying)?: A Little Help from another person to put on and taking off regular upper body clothing?: A Little Help from another person to put on and taking off regular lower body clothing?: A Little 6 Click Score: 19   End of Session Equipment Utilized During Treatment: Rolling walker (2 wheels) Nurse Communication: Mobility status  Activity Tolerance: Patient tolerated treatment well Patient left: in chair;with call bell/phone within reach;with chair alarm set  OT Visit Diagnosis: Unsteadiness on feet (R26.81);Repeated falls (R29.6);Muscle weakness (generalized) (M62.81)                Time: 8890-8875 OT Time Calculation (min): 15 min Charges:  OT General Charges $OT Visit: 1 Visit OT Evaluation $OT Eval Moderate Complexity: 1 [address], [place], OTR/L , CBIS ascom [phone]  03/03/24, 12:44 PM

## 2024-03-03 NOTE — Progress Notes (Signed)
 Progress Note  Patient Name: Cameron Foster. Date of Encounter: 03/03/2024  Primary Cardiologist: End  Subjective   Dyspnea and lower extremity swelling improving. No chest pain, dizziness, presyncope, or syncope.   Documented UOP 1.3 L for the past 24 hours, net - 2.2 L for the admission. Weight trend 94.9 kg to 94.4 kg over the past 24 hours. Remains hemodynamically stable. Renal function pending this morning.   Inpatient Medications    Scheduled Meds:  amiodarone   200 mg Oral Daily   atorvastatin   40 mg Oral Daily   cholecalciferol  1,000 Units Oral Daily   cyanocobalamin   1,000 mcg Oral Daily   folic acid   1 mg Oral Daily   furosemide   60 mg Intravenous Q12H   loratadine   10 mg Oral Daily   pantoprazole   40 mg Oral Daily   sertraline   100 mg Oral Daily   thiamine   100 mg Oral Daily   zinc sulfate (50mg  elemental zinc)  220 mg Oral Daily   Continuous Infusions:  PRN Meds: acetaminophen , albuterol , dextromethorphan -guaiFENesin , fluticasone , hydrALAZINE , ondansetron  (ZOFRAN ) IV   Vital Signs    Vitals:   03/02/24 1953 03/02/24 2317 03/03/24 0415 03/03/24 0500  BP: 138/84 116/74 108/78   Pulse: 92 91 87   Resp: 20 20 20    Temp: 98.4 F (36.9 C) 98.3 F (36.8 C) 98.3 F (36.8 C)   TempSrc: Oral Oral    SpO2: 94% 94% 93%   Weight:    94.4 kg  Height:        Intake/Output Summary (Last 24 hours) at 03/03/2024 0757 Last data filed at 03/03/2024 0100 Gross per 24 hour  Intake 600 ml  Output 1950 ml  Net -1350 ml   Filed Weights   02/29/24 1437 03/02/24 0500 03/03/24 0500  Weight: 94.8 kg 94.9 kg 94.4 kg    Telemetry    SR with first degree AV block with rare isolated PVC, 80s to 90s bpm - Personally Reviewed  ECG    No new tracings - Personally Reviewed  Physical Exam   GEN: No acute distress.   Neck: No JVD. Cardiac: RRR, no murmurs, rubs, or gallops.  Respiratory: Clear to auscultation bilaterally.  GI: Soft, nontender, non-distended.    MS: Improving lower extremity edema with SCDs in place; No deformity. Neuro:  Alert and oriented x 3; Nonfocal.  Psych: Normal affect.  Labs    Chemistry Recent Labs  Lab 02/29/24 1442 03/01/24 0454 03/02/24 0538  NA 137 137 136  K 4.5 3.9 3.6  CL 100 99 99  CO2 28 30 31   GLUCOSE 132* 95 94  BUN 24* 23 22  CREATININE 2.37* 2.22* 2.02*  CALCIUM  9.0 9.0 8.8*  PROT  --   --  6.3*  ALBUMIN   --   --  3.3*  AST  --   --  21  ALT  --   --  12  ALKPHOS  --   --  68  BILITOT  --   --  0.5  GFRNONAA 27* 29* 32*  ANIONGAP 9 8 6      Hematology Recent Labs  Lab 03/01/24 0454 03/01/24 1825 03/02/24 0538  WBC 6.2 7.5 5.6  RBC 2.95* 2.90* 2.83*  2.81*  HGB 8.2* 8.0* 7.8*  HCT 26.5* 25.7* 24.6*  MCV 89.8 88.6 86.9  MCH 27.8 27.6 27.6  MCHC 30.9 31.1 31.7  RDW 15.0 14.9 14.9  PLT 231 220 228    Cardiac EnzymesNo results for  input(s): TROPONINI in the last 168 hours. No results for input(s): TROPIPOC in the last 168 hours.   BNP Recent Labs  Lab 02/29/24 1442  PROBNP 1,845.0*     DDimer No results for input(s): DDIMER in the last 168 hours.   Radiology    DG Chest 2 View Result Date: 02/29/2024 IMPRESSION: No active cardiopulmonary disease. Electronically Signed   By: Vanetta Chou M.D.   On: 02/29/2024 15:39    Cardiac Studies   Limited echo 03/01/2024: 1. Left ventricular ejection fraction, by estimation, is 60 to 65%. The  left ventricle has normal function. Left ventricular endocardial border  not optimally defined to evaluate regional wall motion. There is moderate  left ventricular hypertrophy. Left  ventricular diastolic parameters are indeterminate.   2. Right ventricular systolic function is normal. The right ventricular  size is normal. Tricuspid regurgitation signal is inadequate for assessing  PA pressure.   3. Moderate pericardial effusion. The pericardial effusion is  circumferential. No RV diastolic collapse visualized. Mitral and  tricuspid  inflow not performed.   4. The inferior vena cava is dilated in size with <50% respiratory  variability, suggesting right atrial pressure of 15 mmHg.   Comparison(s): A prior study was performed on 12/22/2023. There is now  evidence of a moderate-sized pericardial effusion.  __________  TEE 01/03/2024: 1. Left ventricular ejection fraction, by estimation, is 30 to 35%. The  left ventricle has moderately decreased function. The left ventricle  demonstrates global hypokinesis.   2. Right ventricular systolic function is normal. The right ventricular  size is normal.   3. Left atrial size was severely dilated. No left atrial/left atrial  appendage thrombus was detected.   4. The mitral valve is normal in structure. Mild mitral valve  regurgitation. No evidence of mitral stenosis.   5. The aortic valve is tricuspid. Aortic valve regurgitation is not  visualized. No aortic stenosis is present.   6. There is mild (Grade II) atheroma plaque involving the aortic arch and  descending aorta.   7. The inferior vena cava is normal in size with greater than 50%  respiratory variability, suggesting right atrial pressure of 3 mmHg.   8. Agitated saline contrast bubble study was positive with shunting  observed within 3-6 cardiac cycles suggestive of interatrial shunt. There  is a small patent foramen ovale with predominantly left to right shunting  across the atrial septum.   Cardioversion for atrial fibrillation with RVR to follow: normal sinus  rhythm restored  __________   2D echo 12/22/2023: 1. Left ventricular ejection fraction, by estimation, is 55 to 60%. Left  ventricular ejection fraction by PLAX is 66 %. The left ventricle has  normal function. The left ventricle has no regional wall motion  abnormalities. There is mild left ventricular  hypertrophy. Left ventricular diastolic parameters are indeterminate.   2. Right ventricular systolic function is normal. The right  ventricular  size is normal.   3. The mitral valve is normal in structure. No evidence of mitral valve  regurgitation.   4. The aortic valve was not well visualized. Aortic valve regurgitation  is not visualized.  __________   2D echo 04/27/2022: 1. Left ventricular ejection fraction, by estimation, is 55 to 60%. The  left ventricle has normal function. The left ventricle has no regional  wall motion abnormalities. Left ventricular diastolic parameters are  consistent with Grade II diastolic  dysfunction (pseudonormalization). The average left ventricular global  longitudinal strain is -19.4 %. The global  longitudinal strain is normal.   2. Right ventricular systolic function is normal. The right ventricular  size is normal.   3. The mitral valve is normal in structure. No evidence of mitral valve  regurgitation.   4. The aortic valve is tricuspid. Aortic valve regurgitation is trivial.  __________   Zio patch 07/2021: The patient was monitored for 13 days, 19 hours. The predominant rhythm was sinus with an average rate of 63 bpm (range 50-102 bpm in sinus). There were rare PACs and PVCs. One episode of nonsustained ventricular tachycardia was observed, lasting 5 beats with a maximum rate of 160 bpm. There were 78 atrial runs lasting up to 15 beats with a maximum rate of 171 bpm. No sustained arrhythmia (including atrial fibrillation/flutter) or prolonged pause was observed. Patient triggered event corresponds to sinus rhythm.   Predominantly sinus rhythm with rare PACs and PVCs.  Multiple episodes of brief PSVT noted as well as one short run of NSVT.  No atrial fibrillation/flutter identified. __________   2D echo 06/26/2021: 1. Left ventricular ejection fraction, by estimation, is 45 to 50%. The  left ventricle has mildly decreased function. The left ventricle  demonstrates global hypokinesis. There is moderate concentric left  ventricular hypertrophy. Left ventricular   diastolic parameters are indeterminate. Elevated left ventricular  end-diastolic pressure.   2. Right ventricular systolic function is normal. The right ventricular  size is normal. There is normal pulmonary artery systolic pressure.   3. Left atrial size was mildly dilated.   4. The mitral valve is normal in structure. Mild mitral valve  regurgitation. No evidence of mitral stenosis.   5. The aortic valve is tricuspid. There is mild calcification of the  aortic valve. Aortic valve regurgitation is not visualized. No aortic  stenosis is present.   6. Aortic dilatation noted. There is mild dilatation of the aortic root,  measuring 41 mm. There is mild dilatation of the ascending aorta,  measuring 39 mm.   7. The inferior vena cava is dilated in size with <50% respiratory  variability, suggesting right atrial pressure of 15 mmHg. __________   Zio patch 06/2019: The patient was monitored for 13 days, 19 hours. The predominant rhythm was sinus with an average rate of 74 bpm (range 54 to 120 bpm in sinus). Occasional PACs (~4% burden) and rare PVCs were noted. There were 125 episodes of paroxysmal supraventricular tachycardia lasting up to 17.5 seconds with a maximum rate of 187 bpm. No sustained arrhythmia or prolonged pause was identified. There were no patient triggered events.   Predominantly sinus rhythm with occasional PACs and rare PVCs.  Multiple episodes of PSVT noted, lasting up to 17.5 seconds. __________   2D echo 06/09/2019: 1. Left ventricular ejection fraction, by estimation, is 55 to 60%. The  left ventricle has normal function. The left ventricle has no regional  wall motion abnormalities. Left ventricular diastolic parameters were  normal.   2. Right ventricular systolic function is normal. The right ventricular  size is normal.   3. The mitral valve is normal in structure and function. Trivial mitral  valve regurgitation.   4. The aortic valve is normal in structure  and function. Aortic valve  regurgitation is not visualized. __________   2D echo 03/29/2018: - Left ventricle: The cavity size was normal. Systolic function was    normal. The estimated ejection fraction was in the range of 60%    to 65%. Wall motion was normal; there were no regional wall  motion abnormalities. Left ventricular diastolic function    parameters were normal.  - Aortic valve: There was mild regurgitation.  - Left atrium: The atrium was mildly dilated.  - Right ventricle: Systolic function was normal.  - Atrial septum: Suspected small patent foramen ovale by color flow    Doppler. .  - Pulmonary arteries: Systolic pressure was within the normal    range. __________   2D echo 10/05/2015: - Left ventricle: The cavity size was mildly dilated. There was    mild concentric hypertrophy. Systolic function was normal. The    estimated ejection fraction was in the range of 50% to 55%. Wall    motion was normal; there were no regional wall motion    abnormalities. Left ventricular diastolic function parameters    were normal.  - Aortic valve: There was trivial regurgitation.  - Mitral valve: There was mild regurgitation.  - Left atrium: The atrium was mildly dilated. __________   Lexiscan  MPI 10/02/2015: There was no ST segment deviation noted during stress. No T wave inversion was noted during stress. The study is normal. This is a low risk study. The left ventricular ejection fraction is mildly decreased (45-54%). EF appears close to normal visually. recommend an echocardiogram  Patient Profile     83 y.o. male with history of HFrEF, PAF, CKD stage IIIa, anemia, CVA, subarachnoid hemorrhage, HTN, HLD, colitis with prior GI bleeding, prostate cancer, and alcohol and tobacco use who is being seen today for the evaluation of acute on chronic HFrEF at the request of Dr. Hilma.   Assessment & Plan    1. Acute on chronic HF with recovered EF secondary to NICM and moderate  pericardial effusion: - Remains volume up, though improving - Continue diuresis with IV Lasix  60 mg twice daily with KCl repletion (labs pending this morning to assess renal function trend) - GDMT on hold at this time with hypotension, as this improves would look to reinitiate GDMT - Echo this admission demonstrated recovered LV systolic function with new moderate pericardial effusion without evidence of tamponade - Hemodynamically stable - No indication for emergent pericardiocentesis - Query if this is hemopericardium in the setting of anticoagulation  - Continue diuresis as above with recommendation for repeat limited echo today - Pending renal function trend and diuresis, may need to consider RHC early next week to further assess hemodynamics and guide pharmacotherapy - Daily weights with strict I's and O's   2.  Persistent A-fib: - Maintaining sinus rhythm following recent TEE guided DCCV in 12/2023 - Remains on amiodarone  200 mg daily under the direction of EP - CHA2DS2-VASc at least 7 (CHF, HTN, age x 2, CVA x 2, vascular disease) - Hold Eliquis  given concern for possible hemopericardium as above, and in the setting of anemia (has completed > 4 weeks of OAC from DCCV)   3.  Acute on CKD stage IIIb: - Likely secondary to hypervolemia - Improving with IV diuresis, pending this morning  - Continue to monitor - No current indication for inotropic support to augment diuresis   4.  Normocytic anemia: - Over the past several months, his hemoglobin has been slowly downtrending to a current value of 8.2 - Denies symptoms of bleeding - Cannot exclude hemopericardium  - Apixaban  held as above - Monitor  - Maintain Hgb > 8 - Possibly contributing to overall presentation   5.  HTN: - BPs have been running soft leading to the de-escalation of GDMT as outlined above - Stable -  Monitor with diuresis   6.  Alcohol/tobacco use: - Complete cessation recommended - LFT normal   7.  OSA: -  Recommend CPAP at night while admitted   8.  HLD: - LDL 68 in 12/2022 - PTA atorvastatin  40 mg      For questions or updates, please contact [facility] Please consult [email or web address] for contact info under Cardiology/STEMI.    Signed, Bernardino Bring, PA-C [facility] Pager: [phone] 03/03/2024, 7:57 AM

## 2024-03-03 NOTE — Progress Notes (Signed)
 Pt educated this was a tobacco free campus. Pt stated I know I'm not supposed to use it but guess what I don't care. Pt provided additional education but continued to state he was going to use it. CN aware. Notified MD. MD to speak with patient on rounds.

## 2024-03-03 NOTE — Evaluation (Signed)
 Physical Therapy Evaluation Patient Details Name: Cameron Foster. MRN: [id number] DOB: Apr 17, 1941 Today's Date: 03/03/2024  History of Present Illness  Pt is an 83 y/o M admitted on 02/29/24 after presenting with c/o worsening LE edema, abdominal swelling, referred from cardiology office with anasarca & low BP. Pt is being treated for acute on chronic systolic CHF. PMH: CHF, stroke, depression, anxiety, HTN, HLD, prediabetes, CAD, CKD3A, a-fib on Eliquis , R bundle blockade, IBS, obesity, alcohol use, SAH, prostate CA, colitis  Clinical Impression  Pt seen for PT evaluation with pt agreeable. Pt reports prior to admission he was living alone & has 2 PCAs that assist him, is currently receiving HHPT. On this date, pt is able to complete bed mobility with extra time, sit>stand with CGA & ambulation into hallway with RW & CGA. Gait distance limited by pt c/o dizziness, VSS. Recommend ongoing PT services to progress mobility & reduce fall risk.        If plan is discharge home, recommend the following: A little help with walking and/or transfers;A little help with bathing/dressing/bathroom;Assist for transportation;Help with stairs or ramp for entrance   Can travel by private vehicle        Equipment Recommendations BSC/3in1  Recommendations for Other Services       Functional Status Assessment Patient has had a recent decline in their functional status and demonstrates the ability to make significant improvements in function in a reasonable and predictable amount of time.     Precautions / Restrictions Precautions Precautions: Fall Restrictions Weight Bearing Restrictions Per Provider Order: No      Mobility  Bed Mobility Overal bed mobility: Needs Assistance Bed Mobility: Supine to Sit     Supine to sit: HOB elevated, Supervision, Used rails (exits R side of bed with bed rails, extra time to scoot to sitting EOB)          Transfers Overall transfer level: Needs  assistance Equipment used: Rolling walker (2 wheels) Transfers: Sit to/from Stand Sit to Stand: Contact guard assist           General transfer comment: cuing re: push to stand vs BUE pulling up on RW    Ambulation/Gait Ambulation/Gait assistance: Contact guard assist Gait Distance (Feet): 50 Feet Assistive device: Rolling walker (2 wheels) Gait Pattern/deviations: Decreased step length - left, Decreased step length - right, Decreased stride length Gait velocity: decreased     General Gait Details: education to ambulate within base of AD  Stairs            Wheelchair Mobility     Tilt Bed    Modified Rankin (Stroke Patients Only)       Balance Overall balance assessment: Needs assistance Sitting-balance support: Feet supported Sitting balance-Leahy Scale: Good     Standing balance support: During functional activity, Bilateral upper extremity supported, Reliant on assistive device for balance Standing balance-Leahy Scale: Fair                               Pertinent Vitals/Pain Pain Assessment Pain Assessment: No/denies pain    Home Living Family/patient expects to be discharged to:: Private residence Living Arrangements: Alone Available Help at Discharge: Personal care attendant Type of Home: House Home Access: Level entry       Home Layout: One level Home Equipment: Rollator (4 wheels);Rolling Walker (2 wheels);Cane - single point;Shower seat Additional Comments: Pt reports he has a PCA 4 hours/day 7 days/week &  a 2nd PCA 4 hours/day 2 days/week that assists him with bathing.    Prior Function               Mobility Comments: Pt reports he's ambulatory with RW, sister not letting pt drive for the past 6 month (she provides transportation), denies falls ADLs Comments: assistance for bathing & dressing     Extremity/Trunk Assessment   Upper Extremity Assessment Upper Extremity Assessment: Overall WFL for tasks assessed     Lower Extremity Assessment Lower Extremity Assessment: Generalized weakness (significant BLE edema)       Communication   Communication Communication: No apparent difficulties    Cognition Arousal: Alert Behavior During Therapy: WFL for tasks assessed/performed   PT - Cognitive impairments: No apparent impairments                         Following commands: Intact       Cueing Cueing Techniques: Verbal cues     General Comments General comments (skin integrity, edema, etc.): Pt on room air, BP sitting after gait: 102/71 mmHg MAP 94, standing at 0: 106/77 mmHg MAP 96    Exercises     Assessment/Plan    PT Assessment Patient needs continued PT services  PT Problem List Decreased strength;Decreased coordination;Decreased activity tolerance;Decreased safety awareness;Decreased balance;Decreased mobility;Decreased knowledge of use of DME;Decreased range of motion;Cardiopulmonary status limiting activity       PT Treatment Interventions DME instruction;Balance training;Gait training;Neuromuscular re-education;Stair training;Functional mobility training;Therapeutic activities;Therapeutic exercise;Patient/family education    PT Goals (Current goals can be found in the Care Plan section)  Acute Rehab PT Goals Patient Stated Goal: get better PT Goal Formulation: With patient Time For Goal Achievement: 03/17/24 Potential to Achieve Goals: Good    Frequency Min 2X/week     Co-evaluation               AM-PAC PT 6 Clicks Mobility  Outcome Measure Help needed turning from your back to your side while in a flat bed without using bedrails?: A Little Help needed moving from lying on your back to sitting on the side of a flat bed without using bedrails?: A Little Help needed moving to and from a bed to a chair (including a wheelchair)?: A Little Help needed standing up from a chair using your arms (e.g., wheelchair or bedside chair)?: A Little Help needed to  walk in hospital room?: A Little Help needed climbing 3-5 steps with a railing? : A Little 6 Click Score: 18    End of Session   Activity Tolerance: Patient tolerated treatment well Patient left: in chair (in handoff to OT) Nurse Communication: Mobility status PT Visit Diagnosis: Muscle weakness (generalized) (M62.81);Other abnormalities of gait and mobility (R26.89)    Time: 8897-8882 PT Time Calculation (min) (ACUTE ONLY): 15 min   Charges:   PT Evaluation $PT Eval Moderate Complexity: 1 Mod   PT General Charges $$ ACUTE PT VISIT: 1 Visit         Richerd Pinal, PT, DPT 03/03/24, 12:25 PM   Richerd CHRISTELLA Pinal 03/03/2024, 12:24 PM

## 2024-03-03 NOTE — Care Management Important Message (Signed)
 Important Message  Patient Details  Name: Cameron Foster. MRN: [id number] Date of Birth: [birth year]   Important Message Given:  Yes - Medicare IM     Cameron Foster 03/03/2024, 6:11 PM

## 2024-03-03 NOTE — TOC Initial Note (Signed)
 Transition of Care (TOC) - Initial/Assessment Note    Patient Details  Name: Cameron Foster. MRN: [id number] Date of Birth: [birth year]  Transition of Care [facility]) CM/SW Contact:    Victory Jackquline RAMAN, RN Phone Number: 03/03/2024, 4:17 PM  Clinical Narrative:     RNCM met with patient and his Niece at bedside. RNCM Introduced role and explained that discharge planning would be discussed. PT is recommending  HH/PT, Patient currently has HH/PT/OT/Aide with [facility] and would like to resume services with them once he's discharged. Patient lives with his sister and she takes him to his appointments and will pick him up at the time of discharge. Patient has DME at home([employer], Museum/gallery Exhibitions Officer, and Paediatric Nurse.  Referral in for [facility] to resume care. RNCM will continue to follow for discharge planning needs.        Expected Discharge Plan: Home w Home Health Services Barriers to Discharge: Continued Medical Work up   Patient Goals and CMS Choice            Expected Discharge Plan and Services       Living arrangements for the past 2 months: Single Family Home                                      Prior Living Arrangements/Services Living arrangements for the past 2 months: Single Family Home Lives with:: Self, Siblings Patient language and need for interpreter reviewed:: Yes Do you feel safe going back to the place where you live?: Yes      Need for Family Participation in Patient Care: Yes (Comment) Care giver support system in place?: Yes (comment) Current home services: DME, Homehealth aide, Home PT, Home OT, Home RN Criminal Activity/Legal Involvement Pertinent to Current Situation/Hospitalization: No - Comment as needed  Activities of Daily Living   ADL Screening (condition at time of admission) Independently performs ADLs?: Yes (appropriate for developmental age) Is the patient deaf or have difficulty hearing?: No Does the patient have difficulty seeing, even when  wearing glasses/contacts?: No Does the patient have difficulty concentrating, remembering, or making decisions?: No  Permission Sought/Granted                  Emotional Assessment Appearance:: Appears stated age, Well-Groomed Attitude/Demeanor/Rapport: Engaged, Gracious Affect (typically observed): Calm, Quiet, Pleasant Orientation: : Oriented to Self, Oriented to Place, Oriented to  Time, Oriented to Situation Alcohol / Substance Use: Not Applicable Psych Involvement: No (comment)  Admission diagnosis:  Acute on chronic systolic CHF (congestive heart failure) (HCC) [I50.23] Congestive heart failure, unspecified HF chronicity, unspecified heart failure type (HCC) [I50.9] Patient Active Problem List   Diagnosis Date Noted   Persistent atrial fibrillation (HCC) 03/02/2024   Pericardial effusion 03/02/2024   Acute on chronic systolic CHF (congestive heart failure) (HCC) 02/29/2024   Hypotension 02/29/2024   CAD (coronary artery disease) 02/29/2024   Depression with anxiety 02/29/2024   Obesity (BMI 30-39.9) 02/29/2024   Acute renal failure superimposed on stage 3a chronic kidney disease (HCC) 02/29/2024   Normocytic anemia 02/29/2024   CKD (chronic kidney disease) stage 2, GFR 60-89 ml/min 02/01/2024   Dilated cardiomyopathy (HCC) 01/03/2024   Chest pain 01/01/2024   Hypokalemia 12/21/2023   History of colitis 12/21/2023   OSA (obstructive sleep apnea) 03/27/2023   Prediabetes 12/26/2022   Lumbar radiculopathy 10/24/2022   Urinary problem 05/04/2022   Post-void dribbling 04/25/2022   Penile  atrophy 04/25/2022   Bilateral leg weakness 04/25/2022   Gait abnormality 04/25/2022   Ataxia, 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   Tobacco abuse 12/21/2021   Chronic bronchitis (HCC) 10/13/2021   Hypertensive heart failure (HCC) 09/30/2021   Presence of right artificial hip joint 09/30/2021   Long term (current) use of anticoagulants 09/30/2021   Atherosclerosis of  coronary artery without angina pectoris 09/30/2021   Allergic rhinitis 09/30/2021   Gastroesophageal reflux disease without esophagitis 09/30/2021   Irritable bowel syndrome 09/30/2021   Malignant neoplasm of skin 09/30/2021   Vitamin B deficiency 09/30/2021   Paroxysmal atrial fibrillation (HCC) 09/30/2021   History of total hip replacement, right 09/27/2021   Atrial fibrillation with rapid ventricular response (HCC)    Acute systolic heart failure (HCC) 06/26/2021   Primary squamous cell carcinoma of skin of left upper extremity 05/24/2021   Irregular Z line of esophagus 02/25/2021   Chronic pansinusitis 01/26/2021   Dizziness 01/26/2021   Melena 01/13/2021   Stage 3a chronic kidney disease (HCC) 11/23/2020   Chronic pain of right ankle 05/21/2020   Synovitis and tenosynovitis 04/30/2020   Lymphedema 03/03/2020   Chronic venous insufficiency 03/03/2020   Debility 02/14/2020   Leg edema 02/14/2020   Foraminal stenosis of lumbar region 11/08/2019   Chronic bilateral low back pain without sciatica 10/18/2019   Foraminal stenosis of cervical region 10/18/2019   History of lacunar cerebrovascular accident (CVA) 07/10/2019   History of stroke 07/01/2019   TIA (transient ischemic attack) 10/20/2018   History of subarachnoid hemorrhage 10/16/2018   Low libido 08/30/2018   Obesity 05/14/2018   Hyperlipidemia LDL goal <70 05/14/2018   Degeneration of lumbar intervertebral disc 03/22/2018   Osteoarthritis of hip 03/22/2018   Trochanteric bursitis of right hip 03/22/2018   History of colostomy reversal 03/21/2017   Status post partial colectomy 01/03/2017   Mild major depression, single episode 01/03/2017   Vasovagal syncope 01/27/2016   History of skin cancer 10/28/2015   Osteoarthritis of right knee 10/01/2015   Essential hypertension 08/25/2015   Shortness of breath 08/12/2015   Erectile dysfunction following radiation therapy 08/04/2015   History of prostate cancer 01/20/2015    Grief 01/20/2015   Swelling of right lower extremity 01/20/2015   PCP:  Myrla Jon [name], MD Pharmacy:   [facility] [address] [place] [address] Phone: [phone] Fax: [phone]     Social Drivers of Health (SDOH) Social History: SDOH Screenings   Food Insecurity: No Food Insecurity (03/01/2024)  Housing: Low Risk  (03/01/2024)  Transportation Needs: No Transportation Needs (03/01/2024)  Utilities: Not At Risk (03/01/2024)  Alcohol Screen: Medium Risk (01/28/2024)  Depression (PHQ2-9): High Risk (02/05/2024)  Financial Resource Strain: Low Risk  (01/28/2024)  Physical Activity: Inactive (01/28/2024)  Social Connections: Moderately Integrated (03/01/2024)  Stress: No Stress Concern Present (01/28/2024)  Recent Concern: Stress - Stress Concern Present (11/24/2023)  Tobacco Use: High Risk (02/29/2024)  Health Literacy: Adequate Health Literacy (05/31/2023)   SDOH Interventions:     Readmission Risk Interventions     No data to display

## 2024-03-03 NOTE — Progress Notes (Signed)
 PROGRESS NOTE    Cameron Foster.  FMW:[id number] DOB: 05/25/1940 DOA: 02/29/2024 PCP: Myrla Jon [name], MD  Chief Complaint  Patient presents with   Leg Swelling    Hospital Course:  Cameron Foster. is a 83 y.o. male with medical history significant of CHF with EF 30 to 35%, stroke, depression with anxiety, hypertension, hyperlipidemia, prediabetes, CAD, CKD-3A, A-fib on Eliquis , right bundle blockade, IBS, obesity, alcohol use, who presents with anasarca and low blood pressure.  Was sent to ED from clinic.  Patient was admitted for acute on chronic heart failure.  Hospital course as below  Subjective: Patient was examined at the bedside, niece present at the bedside.  Reports feeling better today. Denies any complaints today RN reported patient using dipping tobacco in his room, advised against using it and patient agreed.    Objective: Vitals:   03/03/24 0500 03/03/24 0802 03/03/24 1129 03/03/24 1550  BP:  117/74 111/82 127/84  Pulse:  80 83 85  Resp:  16 16 16   Temp:  98.2 F (36.8 C) 98.2 F (36.8 C) 97.6 F (36.4 C)  TempSrc:      SpO2:  92% 93% 95%  Weight: 94.4 kg     Height:        Intake/Output Summary (Last 24 hours) at 03/03/2024 1711 Last data filed at 03/03/2024 1500 Gross per 24 hour  Intake 420 ml  Output 1700 ml  Net -1280 ml   Filed Weights   02/29/24 1437 03/02/24 0500 03/03/24 0500  Weight: 94.8 kg 94.9 kg 94.4 kg    Examination: General: Not in acute distress.  Has anasarca Neck: Difficult to assess JVD due to obesity, no bruit, no mass felt. Cardiac: S1/S2, RRR, No murmurs, No gallops or rubs. Respiratory: has fine crackles bilaterally GI: Soft, nondistended, nontender, no rebound pain, no organomegaly, BS present. GU: No hematuria Ext: 3+ pitting leg edema bilaterally. 1+DP/PT pulse bilaterally. Musculoskeletal: No joint deformities, No joint redness or warmth, no limitation of ROM in spin. Skin: No rashes.  Neuro: Alert,  oriented X3, cranial nerves II-XII grossly intact, moves all extremities normally  Assessment & Plan:  Acute on chronic systolic CHF (congestive heart failure) Moderate pericardial effusion - Limited Echo shows EF 60 to 65% moderate LVH.  Moderate pericardial effusion without tamponade, ? Hemopericardium in the setting of AC - Lasix  60 mg bid by IV - GDMT on hold due to hypotension - No indication for emergent pericardiocentesis, repeat limited echo pending - Seen by cardiology, appreciate recs   Paroxysmal atrial fibrillation (HCC) - s/p TEE guided DCCV - Amiodarone  200mg  daily - CHA2DS2-VASc score 7, on Eliquis  - held due to concern for hemopericardium - Hold metoprolol  due to soft blood pressure  Hypokalemia - Mag normal - Monitor and replete as needed   Essential hypertension: - Hold metoprolol  - Patient is on IV Lasix    CAD (coronary artery disease): No chest pain - Lipitor   Normocytic anemia: Hemoglobin 8.5 -> 7.8 -> 8.1 (10.2 on 01/04/2024).  Patient denies rectal bleeding or dark stool. - Eliquis  on hold due to concern for hemopericardium - Iron panel with elevated ferritin consistent with ACD. Retic count normal, FA and B12 normal - FOBT, UA pending   Hyperlipidemia LDL goal <70 -Lipitor   AKI on CKD stage 3a - Recent baseline creatinine 1.83 on 02/01/2024.  Likely due to cardiorenal syndrome - Cr 2.37 -> 2.02 -> 1.86, improving - Follow-up by BMP   Depression with anxiety -Zoloft   OSA - CPAP at night  Alcohol and tobacco use - cessation counseling - FA, MV, B1   Obesity (BMI 30-39.9): Patient has Obesity Class I, with body weight 94.8 Kg and BMI 30.86 kg/m2.  - Encourage losing weight - Exercise and healthy diet  - PT/OT rec Home PT/OT  DVT prophylaxis: SCD   Code Status: Full Code Disposition:  TBD  Consultants:  Treatment Team:  Consulting Physician: Mady Bruckner, MD Consulting Physician: Perla Evalene PARAS, MD  Procedures:   None  Antimicrobials:  Anti-infectives (From admission, onward)    None       Data Reviewed: I have personally reviewed following labs and imaging studies CBC: Recent Labs  Lab 02/29/24 1442 02/29/24 2250 03/01/24 0454 03/01/24 1825 03/02/24 0538 03/03/24 0514  WBC 7.3 7.2 6.2 7.5 5.6  --   HGB 8.5* 8.7* 8.2* 8.0* 7.8* 8.1*  HCT 28.1* 28.7* 26.5* 25.7* 24.6*  --   MCV 92.4 90.5 89.8 88.6 86.9  --   PLT 249 224 231 220 228  --    Basic Metabolic Panel: Recent Labs  Lab 02/29/24 1442 03/01/24 0454 03/02/24 0538 03/03/24 0514  NA 137 137 136 135  K 4.5 3.9 3.6 3.5  CL 100 99 99 95*  CO2 28 30 31 31   GLUCOSE 132* 95 94 99  BUN 24* 23 22 20   CREATININE 2.37* 2.22* 2.02* 1.86*  CALCIUM  9.0 9.0 8.8* 8.8*  MG 2.2  --   --  2.2   GFR: Estimated Creatinine Clearance: 34.1 mL/min (A) (by C-G formula based on SCr of 1.86 mg/dL (H)). Liver Function Tests: Recent Labs  Lab 03/02/24 0538  AST 21  ALT 12  ALKPHOS 68  BILITOT 0.5  PROT 6.3*  ALBUMIN  3.3*   CBG: Recent Labs  Lab 02/29/24 2158  GLUCAP 123*    No results found for this or any previous visit (from the past 240 hours).   Radiology Studies: No results found.   Scheduled Meds:  amiodarone   200 mg Oral Daily   atorvastatin   40 mg Oral Daily   cholecalciferol  1,000 Units Oral Daily   cyanocobalamin   1,000 mcg Oral Daily   folic acid   1 mg Oral Daily   furosemide   60 mg Intravenous Q12H   loratadine   10 mg Oral Daily   pantoprazole   40 mg Oral Daily   sertraline   100 mg Oral Daily   thiamine   100 mg Oral Daily   zinc sulfate (50mg  elemental zinc)  220 mg Oral Daily   Continuous Infusions:   LOS: 3 days  MDM: Patient is high risk for one or more organ failure.  They necessitate ongoing hospitalization for continued IV therapies and subsequent lab monitoring. Total time spent interpreting labs and vitals, reviewing the medical record, coordinating care amongst consultants and care team  members, directly assessing and discussing care with the patient and/or family: 55 min Laree Lock, MD [facility]  To contact the attending physician between 7A-7P please use Epic Chat. To contact the covering physician during after hours 7P-7A, please review Amion.  03/03/2024, 5:11 PM   *This document has been created with the assistance of dictation software. Please excuse typographical errors. *

## 2024-03-04 DIAGNOSIS — J42 Unspecified chronic bronchitis: Secondary | ICD-10-CM

## 2024-03-04 DIAGNOSIS — C44629 Squamous cell carcinoma of skin of left upper limb, including shoulder: Secondary | ICD-10-CM

## 2024-03-04 DIAGNOSIS — M5416 Radiculopathy, lumbar region: Secondary | ICD-10-CM

## 2024-03-04 DIAGNOSIS — N1831 Chronic kidney disease, stage 3a: Secondary | ICD-10-CM

## 2024-03-04 DIAGNOSIS — E785 Hyperlipidemia, unspecified: Secondary | ICD-10-CM

## 2024-03-04 DIAGNOSIS — C61 Malignant neoplasm of prostate: Secondary | ICD-10-CM

## 2024-03-04 DIAGNOSIS — I480 Paroxysmal atrial fibrillation: Secondary | ICD-10-CM | POA: Diagnosis not present

## 2024-03-04 DIAGNOSIS — F1020 Alcohol dependence, uncomplicated: Secondary | ICD-10-CM

## 2024-03-04 DIAGNOSIS — I5032 Chronic diastolic (congestive) heart failure: Secondary | ICD-10-CM | POA: Diagnosis not present

## 2024-03-04 DIAGNOSIS — I130 Hypertensive heart and chronic kidney disease with heart failure and stage 1 through stage 4 chronic kidney disease, or unspecified chronic kidney disease: Secondary | ICD-10-CM | POA: Diagnosis not present

## 2024-03-04 DIAGNOSIS — I7121 Aneurysm of the ascending aorta, without rupture: Secondary | ICD-10-CM

## 2024-03-04 DIAGNOSIS — I5023 Acute on chronic systolic (congestive) heart failure: Secondary | ICD-10-CM | POA: Diagnosis not present

## 2024-03-04 LAB — ECHOCARDIOGRAM LIMITED
Height: 69 in
S' Lateral: 3.1 cm
Weight: 3329.83 [oz_av]

## 2024-03-04 LAB — BASIC METABOLIC PANEL WITH GFR
Anion gap: 8 (ref 5–15)
BUN: 20 mg/dL (ref 8–23)
CO2: 31 mmol/L (ref 22–32)
Calcium: 8.8 mg/dL — ABNORMAL LOW (ref 8.9–10.3)
Chloride: 97 mmol/L — ABNORMAL LOW (ref 98–111)
Creatinine, Ser: 1.95 mg/dL — ABNORMAL HIGH (ref 0.61–1.24)
GFR, Estimated: 34 mL/min — ABNORMAL LOW (ref >60)
Glucose, Bld: 106 mg/dL — ABNORMAL HIGH (ref 70–99)
Potassium: 3.8 mmol/L (ref 3.5–5.1)
Sodium: 136 mmol/L (ref 135–145)

## 2024-03-04 LAB — MAGNESIUM: Magnesium: 2.3 mg/dL (ref 1.7–2.4)

## 2024-03-04 LAB — HEMOGLOBIN: Hemoglobin: 8.3 g/dL — ABNORMAL LOW (ref 13.0–17.0)

## 2024-03-04 NOTE — Progress Notes (Signed)
 Pt's BP noted to be low by MD. Rechecked by primary RN, Lauralyn and reviewed with MD via epic chat. No new orders at this time.

## 2024-03-04 NOTE — Plan of Care (Signed)
  Problem: Fluid Volume: Goal: Ability to maintain a balanced intake and output will improve Outcome: Progressing   Problem: Nutritional: Goal: Maintenance of adequate nutrition will improve Outcome: Progressing   Problem: Skin Integrity: Goal: Risk for impaired skin integrity will decrease Outcome: Progressing   Problem: Tissue Perfusion: Goal: Adequacy of tissue perfusion will improve Outcome: Progressing   Problem: Nutrition: Goal: Adequate nutrition will be maintained Outcome: Progressing   Problem: Safety: Goal: Ability to remain free from injury will improve Outcome: Progressing

## 2024-03-04 NOTE — Progress Notes (Signed)
 Progress Note  Patient Name: Cameron Foster. Date of Encounter: 03/04/2024 [facility] Cardiologist: Lonni Hanson, MD   Interval Summary    UOP -1.6L. limited echo read pending. BP soft overnight. The patient reports no chest pain or SOB.   Vital Signs Vitals:   03/03/24 2038 03/03/24 2341 03/04/24 0449 03/04/24 0500  BP: 112/70 96/68 119/74   Pulse: 92 86 81   Resp: 20 18 19    Temp: 98.3 F (36.8 C) 98 F (36.7 C) 97.9 F (36.6 C)   TempSrc:  Oral Oral   SpO2: 93% 93% 94%   Weight:    96 kg  Height:        Intake/Output Summary (Last 24 hours) at 03/04/2024 0720 Last data filed at 03/03/2024 2100 Gross per 24 hour  Intake 420 ml  Output 1600 ml  Net -1180 ml      03/04/2024    5:00 AM 03/03/2024    5:00 AM 03/02/2024    5:00 AM  Last 3 Weights  Weight (lbs) 211 lb 10.3 oz 208 lb 1.8 oz 209 lb 3.5 oz  Weight (kg) 96 kg 94.4 kg 94.9 kg      Telemetry/ECG  NSR HR 80s - Personally Reviewed  Physical Exam  GEN: No acute distress.   Neck: + JVD Cardiac: RRR, no murmurs, rubs, or gallops.  Respiratory: Clear to auscultation bilaterally. GI: Soft, nontender, non-distended  MS: 1+ pedal edema  CV studies:  Limited echo 03/01/2024: 1. Left ventricular ejection fraction, by estimation, is 60 to 65%. The  left ventricle has normal function. Left ventricular endocardial border  not optimally defined to evaluate regional wall motion. There is moderate  left ventricular hypertrophy. Left  ventricular diastolic parameters are indeterminate.   2. Right ventricular systolic function is normal. The right ventricular  size is normal. Tricuspid regurgitation signal is inadequate for assessing  PA pressure.   3. Moderate pericardial effusion. The pericardial effusion is  circumferential. No RV diastolic collapse visualized. Mitral and tricuspid  inflow not performed.   4. The inferior vena cava is dilated in size with <50% respiratory  variability,  suggesting right atrial pressure of 15 mmHg.   Comparison(s): A prior study was performed on 12/22/2023. There is now  evidence of a moderate-sized pericardial effusion.  __________   TEE 01/03/2024: 1. Left ventricular ejection fraction, by estimation, is 30 to 35%. The  left ventricle has moderately decreased function. The left ventricle  demonstrates global hypokinesis.   2. Right ventricular systolic function is normal. The right ventricular  size is normal.   3. Left atrial size was severely dilated. No left atrial/left atrial  appendage thrombus was detected.   4. The mitral valve is normal in structure. Mild mitral valve  regurgitation. No evidence of mitral stenosis.   5. The aortic valve is tricuspid. Aortic valve regurgitation is not  visualized. No aortic stenosis is present.   6. There is mild (Grade II) atheroma plaque involving the aortic arch and  descending aorta.   7. The inferior vena cava is normal in size with greater than 50%  respiratory variability, suggesting right atrial pressure of 3 mmHg.   8. Agitated saline contrast bubble study was positive with shunting  observed within 3-6 cardiac cycles suggestive of interatrial shunt. There  is a small patent foramen ovale with predominantly left to right shunting  across the atrial septum.   Cardioversion for atrial fibrillation with RVR to follow: normal sinus  rhythm restored  __________   2D echo 12/22/2023: 1. Left ventricular ejection fraction, by estimation, is 55 to 60%. Left  ventricular ejection fraction by PLAX is 66 %. The left ventricle has  normal function. The left ventricle has no regional wall motion  abnormalities. There is mild left ventricular  hypertrophy. Left ventricular diastolic parameters are indeterminate.   2. Right ventricular systolic function is normal. The right ventricular  size is normal.   3. The mitral valve is normal in structure. No evidence of mitral valve  regurgitation.    4. The aortic valve was not well visualized. Aortic valve regurgitation  is not visualized.  __________   2D echo 04/27/2022: 1. Left ventricular ejection fraction, by estimation, is 55 to 60%. The  left ventricle has normal function. The left ventricle has no regional  wall motion abnormalities. Left ventricular diastolic parameters are  consistent with Grade II diastolic  dysfunction (pseudonormalization). The average left ventricular global  longitudinal strain is -19.4 %. The global longitudinal strain is normal.   2. Right ventricular systolic function is normal. The right ventricular  size is normal.   3. The mitral valve is normal in structure. No evidence of mitral valve  regurgitation.   4. The aortic valve is tricuspid. Aortic valve regurgitation is trivial.  __________   Zio patch 07/2021: The patient was monitored for 13 days, 19 hours. The predominant rhythm was sinus with an average rate of 63 bpm (range 50-102 bpm in sinus). There were rare PACs and PVCs. One episode of nonsustained ventricular tachycardia was observed, lasting 5 beats with a maximum rate of 160 bpm. There were 78 atrial runs lasting up to 15 beats with a maximum rate of 171 bpm. No sustained arrhythmia (including atrial fibrillation/flutter) or prolonged pause was observed. Patient triggered event corresponds to sinus rhythm.   Predominantly sinus rhythm with rare PACs and PVCs.  Multiple episodes of brief PSVT noted as well as one short run of NSVT.  No atrial fibrillation/flutter identified. __________   2D echo 06/26/2021: 1. Left ventricular ejection fraction, by estimation, is 45 to 50%. The  left ventricle has mildly decreased function. The left ventricle  demonstrates global hypokinesis. There is moderate concentric left  ventricular hypertrophy. Left ventricular  diastolic parameters are indeterminate. Elevated left ventricular  end-diastolic pressure.   2. Right ventricular systolic function  is normal. The right ventricular  size is normal. There is normal pulmonary artery systolic pressure.   3. Left atrial size was mildly dilated.   4. The mitral valve is normal in structure. Mild mitral valve  regurgitation. No evidence of mitral stenosis.   5. The aortic valve is tricuspid. There is mild calcification of the  aortic valve. Aortic valve regurgitation is not visualized. No aortic  stenosis is present.   6. Aortic dilatation noted. There is mild dilatation of the aortic root,  measuring 41 mm. There is mild dilatation of the ascending aorta,  measuring 39 mm.   7. The inferior vena cava is dilated in size with <50% respiratory  variability, suggesting right atrial pressure of 15 mmHg. __________   Zio patch 06/2019: The patient was monitored for 13 days, 19 hours. The predominant rhythm was sinus with an average rate of 74 bpm (range 54 to 120 bpm in sinus). Occasional PACs (~4% burden) and rare PVCs were noted. There were 125 episodes of paroxysmal supraventricular tachycardia lasting up to 17.5 seconds with a maximum rate of 187 bpm. No sustained arrhythmia or prolonged pause was  identified. There were no patient triggered events.   Predominantly sinus rhythm with occasional PACs and rare PVCs.  Multiple episodes of PSVT noted, lasting up to 17.5 seconds. __________   2D echo 06/09/2019: 1. Left ventricular ejection fraction, by estimation, is 55 to 60%. The  left ventricle has normal function. The left ventricle has no regional  wall motion abnormalities. Left ventricular diastolic parameters were  normal.   2. Right ventricular systolic function is normal. The right ventricular  size is normal.   3. The mitral valve is normal in structure and function. Trivial mitral  valve regurgitation.   4. The aortic valve is normal in structure and function. Aortic valve  regurgitation is not visualized. __________   2D echo 03/29/2018: - Left ventricle: The cavity size  was normal. Systolic function was    normal. The estimated ejection fraction was in the range of 60%    to 65%. Wall motion was normal; there were no regional wall    motion abnormalities. Left ventricular diastolic function    parameters were normal.  - Aortic valve: There was mild regurgitation.  - Left atrium: The atrium was mildly dilated.  - Right ventricle: Systolic function was normal.  - Atrial septum: Suspected small patent foramen ovale by color flow    Doppler. .  - Pulmonary arteries: Systolic pressure was within the normal    range. __________   2D echo 10/05/2015: - Left ventricle: The cavity size was mildly dilated. There was    mild concentric hypertrophy. Systolic function was normal. The    estimated ejection fraction was in the range of 50% to 55%. Wall    motion was normal; there were no regional wall motion    abnormalities. Left ventricular diastolic function parameters    were normal.  - Aortic valve: There was trivial regurgitation.  - Mitral valve: There was mild regurgitation.  - Left atrium: The atrium was mildly dilated. __________   Lexiscan  MPI 10/02/2015: There was no ST segment deviation noted during stress. No T wave inversion was noted during stress. The study is normal. This is a low risk study. The left ventricular ejection fraction is mildly decreased (45-54%). EF appears close to normal visually. recommend an echocardiogram   Patient Profile     83 y.o. male with history of HFrEF, PAF, CKD stage IIIa, anemia, CVA, subarachnoid hemorrhage, HTN, HLD, colitis with prior GI bleeding, prostate cancer, and alcohol and tobacco use who is being seen today for the evaluation of acute on chronic HFrEF at the request of Dr. Hilma.   Assessment & Plan   Acute on chronic HF with recovered EF secondary to NICM and moderate pericardial effusion - IV lasix  60mg  BID - GDMT held with hypotension (Jardiance 10mg  daily, Toprol  50mg  daily, torsemide 20mg   daily) - echo this admission showed recovered LVSF, new moderate pericardial effusion without tamponade - the patient remains hemodynamically stable - repeat limited echo pending - Net -3.4L - continue with diuresis. Kidney function is stable  Persistent Afib - maintaining NSR after TEE/DCCV 12/2023 - continue amiodarone  200mg  daily - Eliquis  held given effusion/possible hemopericardium  AKI on CKD stage 3 - suspected 2/2 hypovolemia - improving with IV diuresis - Scr 1.95, BUN 20  Normocytic Anemia - Hgb 8-9 - Eliquis  held - Hgb 8.3 today  HTN - BP improving  - GDMT held as above  Alcohol/tobacco use - complete cessation recommended  OSA - recommend CPAP at night  HLD - LDL 68 -  Lipitor 40mg  daily     For questions or updates, please contact [facility] Please consult [email or web address] for contact info under         Signed, Jancarlo Biermann VEAR Fishman, PA-C

## 2024-03-04 NOTE — Progress Notes (Signed)
 PROGRESS NOTE    Cameron Foster.  FMW:[id number] DOB: Aug 20, 1940 DOA: 02/29/2024 PCP: Myrla Jon [name], MD  Chief Complaint  Patient presents with   Leg Swelling    Hospital Course:  Cameron Foster. is a 83 y.o. male with medical history significant of CHF with EF 30 to 35%, stroke, depression with anxiety, hypertension, hyperlipidemia, prediabetes, CAD, CKD-3A, A-fib on Eliquis , right bundle blockade, IBS, obesity, alcohol use, who presents with anasarca and low blood pressure.  Was sent to ED from clinic.  Patient was admitted for acute on chronic heart failure.  Hospital course as below  Subjective: Patient was examined at the bedside,  Reports feeling better today. Reports intermittent difficulty in voiding, will check bladder scan postvoid residual Is anxious about fluid around the heart, pericardial effusion   Objective: Vitals:   03/04/24 0500 03/04/24 0738 03/04/24 1134 03/04/24 1549  BP:  127/76 101/68 (!) 82/62  Pulse:  78 82 83  Resp:  16 18 16   Temp:  (!) 97.5 F (36.4 C) 97.7 F (36.5 C) 97.9 F (36.6 C)  TempSrc:      SpO2:  91% 95% 95%  Weight: 96 kg     Height:        Intake/Output Summary (Last 24 hours) at 03/04/2024 1804 Last data filed at 03/04/2024 1138 Gross per 24 hour  Intake 900 ml  Output 1500 ml  Net -600 ml   Filed Weights   03/02/24 0500 03/03/24 0500 03/04/24 0500  Weight: 94.9 kg 94.4 kg 96 kg    Examination: General: Not in acute distress.  Has anasarca Neck: Difficult to assess JVD due to obesity, no bruit, no mass felt. Cardiac: S1/S2, RRR, No murmurs, No gallops or rubs. Respiratory: has fine crackles bilaterally GI: Soft, nondistended, nontender, no rebound pain, no organomegaly, BS present. GU: No hematuria Ext: 3+ pitting leg edema bilaterally. 1+DP/PT pulse bilaterally. Musculoskeletal: No joint deformities, No joint redness or warmth, no limitation of ROM in spin. Skin: No rashes.  Neuro: Alert, oriented X3,  cranial nerves II-XII grossly intact, moves all extremities normally  Assessment & Plan:  Acute on chronic systolic CHF (congestive heart failure) Moderate pericardial effusion - Limited Echo shows EF 60 to 65% moderate LVH.  Moderate pericardial effusion without tamponade, ? Hemopericardium in the setting of AC - Lasix  60 mg bid by IV, resume Jardiance - GDMT on hold due to hypotension - No indication for pericardiocentesis, no hemodynamic compromise - Seen by cardiology, appreciate recs   Paroxysmal atrial fibrillation (HCC) - s/p TEE guided DCCV - Amiodarone  200mg  daily - CHA2DS2-VASc score 7, on Eliquis  - held due to concern for hemopericardium - Hold metoprolol  due to soft blood pressure  Hypokalemia - Mag normal - Monitor and replete as needed   Essential hypertension: - Hold metoprolol  - Patient is on IV Lasix    CAD (coronary artery disease): No chest pain - Lipitor   Normocytic anemia: Hemoglobin 8.5 -> 7.8 -> 8.1 (10.2 on 01/04/2024).  Patient denies rectal bleeding or dark stool. - Eliquis  on hold due to concern for hemopericardium - Iron panel with elevated ferritin consistent with ACD. Retic count normal, FA and B12 normal - FOBT, UA pending   Hyperlipidemia LDL goal <70 -Lipitor   AKI on CKD stage 3a - Recent baseline creatinine 1.83 on 02/01/2024.  Likely due to cardiorenal syndrome - Cr 2.37 on presentation, improving - Follow-up by BMP   Depression with anxiety -Zoloft   OSA - CPAP at night  Alcohol and tobacco use - cessation counseling - FA, MV, B1   Obesity (BMI 30-39.9): Patient has Obesity Class I, with body weight 94.8 Kg and BMI 30.86 kg/m2.  - Encourage losing weight - Exercise and healthy diet  - PT/OT rec Home PT/OT  DVT prophylaxis: SCD   Code Status: Full Code Disposition:  TBD  Consultants:  Treatment Team:  Consulting Physician: Mady Bruckner, MD Consulting Physician: Perla Evalene PARAS, MD  Procedures:   None  Antimicrobials:  Anti-infectives (From admission, onward)    None       Data Reviewed: I have personally reviewed following labs and imaging studies CBC: Recent Labs  Lab 02/29/24 1442 02/29/24 2250 03/01/24 0454 03/01/24 1825 03/02/24 0538 03/03/24 0514 03/04/24 0430  WBC 7.3 7.2 6.2 7.5 5.6  --   --   HGB 8.5* 8.7* 8.2* 8.0* 7.8* 8.1* 8.3*  HCT 28.1* 28.7* 26.5* 25.7* 24.6*  --   --   MCV 92.4 90.5 89.8 88.6 86.9  --   --   PLT 249 224 231 220 228  --   --    Basic Metabolic Panel: Recent Labs  Lab 02/29/24 1442 03/01/24 0454 03/02/24 0538 03/03/24 0514 03/04/24 0430  NA 137 137 136 135 136  K 4.5 3.9 3.6 3.5 3.8  CL 100 99 99 95* 97*  CO2 28 30 31 31 31   GLUCOSE 132* 95 94 99 106*  BUN 24* 23 22 20 20   CREATININE 2.37* 2.22* 2.02* 1.86* 1.95*  CALCIUM  9.0 9.0 8.8* 8.8* 8.8*  MG 2.2  --   --  2.2 2.3   GFR: Estimated Creatinine Clearance: 32.8 mL/min (A) (by C-G formula based on SCr of 1.95 mg/dL (H)). Liver Function Tests: Recent Labs  Lab 03/02/24 0538  AST 21  ALT 12  ALKPHOS 68  BILITOT 0.5  PROT 6.3*  ALBUMIN  3.3*   CBG: Recent Labs  Lab 02/29/24 2158  GLUCAP 123*    No results found for this or any previous visit (from the past 240 hours).   Radiology Studies: ECHOCARDIOGRAM LIMITED Result Date: 03/04/2024    ECHOCARDIOGRAM LIMITED REPORT   Patient Name:   Cameron Morimoto. Date of Exam: 03/03/2024 Medical Rec #:  [id number]          Height:       69.0 in Accession #:    [id number]         Weight:       208.1 lb Date of Birth:  01-16-1941          BSA:          2.101 m Patient Age:    83 years           BP:           111/82 mmHg Patient Gender: M                  HR:           80 bpm. Exam Location:  [facility] Procedure: 2D Echo, Limited Echo and Cardiac Doppler (Both Spectral and Color            Flow Doppler were utilized during procedure). Indications:     Pericardial effusion  History:         Patient has prior history of  Echocardiogram examinations.  Sonographer:     [name] Rubinstein RDCS Referring Phys:  [id number] [facility] [place] Diagnosing Phys: Annabella Scarce MD  Sonographer Comments: Image acquisition challenging due to  respiratory motion. IMPRESSIONS  1. Left ventricular ejection fraction, by estimation, is 60 to 65%. The left ventricle has normal function. The left ventricle has no regional wall motion abnormalities. There is mild concentric left ventricular hypertrophy.  2. Right ventricular systolic function is normal.  3. Pericardial effusion is unchanged in size from 03/01/24. There is hyperechoic densities anterior to the right ventricle unchanged from prior concerning for thrombus or fibrinous material. No evidence of tamponade. Moderate pericardial effusion. The pericardial effusion is circumferential. There is no evidence of cardiac tamponade.  4. The inferior vena cava is normal in size with greater than 50% respiratory variability, suggesting right atrial pressure of 3 mmHg. FINDINGS  Left Ventricle: Left ventricular ejection fraction, by estimation, is 60 to 65%. The left ventricle has normal function. The left ventricle has no regional wall motion abnormalities. There is mild concentric left ventricular hypertrophy. Right Ventricle: Right ventricular systolic function is normal. Pericardium: Pericardial effusion is unchanged in size from 03/01/24. There is hyperechoic densities anterior to the right ventricle unchanged from prior concerning for thrombus or fibrinous material. No evidence of tamponade. A moderately sized pericardial effusion is present. The pericardial effusion is circumferential. The pericardial effusion appears to contain fibrous material. There is excessive respiratory variation in the tricuspid valve spectral Doppler velocities. There is no evidence of cardiac tamponade. Venous: The inferior vena cava is normal in size with greater than 50% respiratory variability, suggesting right atrial pressure  of 3 mmHg. LEFT VENTRICLE PLAX 2D LVIDd:         4.30 cm LVIDs:         3.10 cm LV PW:         1.30 cm LV IVS:        1.30 cm  LEFT ATRIUM         Index LA diam:    4.00 cm 1.90 cm/m Annabella Scarce MD Electronically signed by Annabella Scarce MD Signature Date/Time: 03/04/2024/8:43:10 AM    Final      Scheduled Meds:  amiodarone   200 mg Oral Daily   atorvastatin   40 mg Oral Daily   cholecalciferol  1,000 Units Oral Daily   cyanocobalamin   1,000 mcg Oral Daily   folic acid   1 mg Oral Daily   furosemide   60 mg Intravenous Q12H   loratadine   10 mg Oral Daily   pantoprazole   40 mg Oral Daily   sertraline   100 mg Oral Daily   thiamine   100 mg Oral Daily   zinc sulfate (50mg  elemental zinc)  220 mg Oral Daily   Continuous Infusions:   LOS: 4 days  MDM: Patient is high risk for one or more organ failure.  They necessitate ongoing hospitalization for continued IV therapies and subsequent lab monitoring. Total time spent interpreting labs and vitals, reviewing the medical record, coordinating care amongst consultants and care team members, directly assessing and discussing care with the patient and/or family: 35 min Laree Lock, MD [facility]  To contact the attending physician between 7A-7P please use Epic Chat. To contact the covering physician during after hours 7P-7A, please review Amion.  03/04/2024, 6:04 PM   *This document has been created with the assistance of dictation software. Please excuse typographical errors. *

## 2024-03-04 NOTE — Plan of Care (Signed)

## 2024-03-04 NOTE — Progress Notes (Signed)
 Mobility Specialist Progress Note:    03/04/24 1507  Mobility  Activity Ambulated with assistance  Level of Assistance Contact guard assist, steadying assist  Assistive Device Front wheel walker  Distance Ambulated (ft) 60 ft  Range of Motion/Exercises Active;All extremities  Activity Response Tolerated well  Mobility visit 1 Mobility  Mobility Specialist Start Time (ACUTE ONLY) 1442  Mobility Specialist Stop Time (ACUTE ONLY) 1501  Mobility Specialist Time Calculation (min) (ACUTE ONLY) 19 min   Pt received in bed, sister and daughter at bedside. Agreeable to mobility, required CGA to stand and ambulate with RW. Tolerated well, c/o dizziness. VSS. Returned to room, left pt in chair. All needs met.  Sherrilee Ditty Mobility Specialist Please contact via Special Educational Needs Teacher or  Rehab office at [phone]

## 2024-03-05 ENCOUNTER — Ambulatory Visit: Admitting: Physician Assistant

## 2024-03-05 ENCOUNTER — Inpatient Hospital Stay

## 2024-03-05 DIAGNOSIS — N189 Chronic kidney disease, unspecified: Secondary | ICD-10-CM | POA: Diagnosis not present

## 2024-03-05 DIAGNOSIS — N179 Acute kidney failure, unspecified: Secondary | ICD-10-CM | POA: Diagnosis not present

## 2024-03-05 DIAGNOSIS — D500 Iron deficiency anemia secondary to blood loss (chronic): Secondary | ICD-10-CM

## 2024-03-05 DIAGNOSIS — I5020 Unspecified systolic (congestive) heart failure: Secondary | ICD-10-CM | POA: Diagnosis not present

## 2024-03-05 DIAGNOSIS — I4819 Other persistent atrial fibrillation: Secondary | ICD-10-CM | POA: Diagnosis not present

## 2024-03-05 DIAGNOSIS — I3139 Other pericardial effusion (noninflammatory): Secondary | ICD-10-CM | POA: Diagnosis not present

## 2024-03-05 LAB — BASIC METABOLIC PANEL WITH GFR
Anion gap: 8 (ref 5–15)
BUN: 21 mg/dL (ref 8–23)
CO2: 33 mmol/L — ABNORMAL HIGH (ref 22–32)
Calcium: 9.1 mg/dL (ref 8.9–10.3)
Chloride: 94 mmol/L — ABNORMAL LOW (ref 98–111)
Creatinine, Ser: 2.23 mg/dL — ABNORMAL HIGH (ref 0.61–1.24)
GFR, Estimated: 29 mL/min — ABNORMAL LOW (ref >60)
Glucose, Bld: 108 mg/dL — ABNORMAL HIGH (ref 70–99)
Potassium: 3.6 mmol/L (ref 3.5–5.1)
Sodium: 135 mmol/L (ref 135–145)

## 2024-03-05 LAB — CBC
HCT: 26.1 % — ABNORMAL LOW (ref 39.0–52.0)
Hemoglobin: 8.1 g/dL — ABNORMAL LOW (ref 13.0–17.0)
MCH: 27.9 pg (ref 26.0–34.0)
MCHC: 31 g/dL (ref 30.0–36.0)
MCV: 90 fL (ref 80.0–100.0)
Platelets: 249 K/uL (ref 150–400)
RBC: 2.9 MIL/uL — ABNORMAL LOW (ref 4.22–5.81)
RDW: 15 % (ref 11.5–15.5)
WBC: 6.1 K/uL (ref 4.0–10.5)
nRBC: 0 % (ref 0.0–0.2)

## 2024-03-05 LAB — SEDIMENTATION RATE: Sed Rate: 63 mm/h — ABNORMAL HIGH (ref 0–20)

## 2024-03-05 LAB — C-REACTIVE PROTEIN: CRP: 6.1 mg/dL — ABNORMAL HIGH (ref <1.0)

## 2024-03-05 LAB — PRO BRAIN NATRIURETIC PEPTIDE: Pro Brain Natriuretic Peptide: 1620 pg/mL — ABNORMAL HIGH (ref <300.0)

## 2024-03-05 MED ORDER — FUROSEMIDE 10 MG/ML IJ SOLN: 60.0000 mg | Freq: Two times a day (BID) | INTRAMUSCULAR | Status: DC

## 2024-03-05 MED ORDER — TAMSULOSIN HCL 0.4 MG PO CAPS
0.4000 mg | ORAL_CAPSULE | Freq: Every day | ORAL | Status: DC
  Administered 2024-03-05 – 2024-03-12 (×8): 0.4 mg via ORAL
  Filled 2024-03-05 (×9): qty 1

## 2024-03-05 NOTE — Plan of Care (Signed)

## 2024-03-05 NOTE — Progress Notes (Signed)
 Progress Note  Patient Name: Cameron Foster. Date of Encounter: 03/05/2024 [facility] Cardiologist: Lonni Hanson, MD   Interval Summary    UOP -2.5L. kidney function up today. The patient reports DOE. No chest pain.   Vital Signs Vitals:   03/05/24 0137 03/05/24 0357 03/05/24 0401 03/05/24 0759  BP: 129/79 117/80  134/86  Pulse:  78  80  Resp:  17  19  Temp:  97.6 F (36.4 C)    TempSrc:      SpO2:  93%  95%  Weight:   93.9 kg   Height:        Intake/Output Summary (Last 24 hours) at 03/05/2024 0955 Last data filed at 03/05/2024 0401 Gross per 24 hour  Intake 240 ml  Output 1900 ml  Net -1660 ml      03/05/2024    4:01 AM 03/04/2024    5:00 AM 03/03/2024    5:00 AM  Last 3 Weights  Weight (lbs) 207 lb 0.2 oz 211 lb 10.3 oz 208 lb 1.8 oz  Weight (kg) 93.9 kg 96 kg 94.4 kg      Telemetry/ECG  Nsr, 1st degree AV block - Personally Reviewed  Physical Exam  GEN: No acute distress.   Neck: + JVD Cardiac: RRR, no murmurs, rubs, or gallops.  Respiratory: crackles at bases. GI: Soft, nontender, non-distended  MS: No edema   CV studies:  Limited echo 03/03/24 1. Left ventricular ejection fraction, by estimation, is 60 to 65%. The  left ventricle has normal function. The left ventricle has no regional  wall motion abnormalities. There is mild concentric left ventricular  hypertrophy.   2. Right ventricular systolic function is normal.   3. Pericardial effusion is unchanged in size from 03/01/24. There is  hyperechoic densities anterior to the right ventricle unchanged from prior  concerning for thrombus or fibrinous material. No evidence of tamponade.  Moderate pericardial effusion. The  pericardial effusion is circumferential. There is no evidence of cardiac  tamponade.   4. The inferior vena cava is normal in size with greater than 50%  respiratory variability, suggesting right atrial pressure of 3 mmHg.  __________  Limited echo  03/01/2024: 1. Left ventricular ejection fraction, by estimation, is 60 to 65%. The  left ventricle has normal function. Left ventricular endocardial border  not optimally defined to evaluate regional wall motion. There is moderate  left ventricular hypertrophy. Left  ventricular diastolic parameters are indeterminate.   2. Right ventricular systolic function is normal. The right ventricular  size is normal. Tricuspid regurgitation signal is inadequate for assessing  PA pressure.   3. Moderate pericardial effusion. The pericardial effusion is  circumferential. No RV diastolic collapse visualized. Mitral and tricuspid  inflow not performed.   4. The inferior vena cava is dilated in size with <50% respiratory  variability, suggesting right atrial pressure of 15 mmHg.   Comparison(s): A prior study was performed on 12/22/2023. There is now  evidence of a moderate-sized pericardial effusion.  __________   TEE 01/03/2024: 1. Left ventricular ejection fraction, by estimation, is 30 to 35%. The  left ventricle has moderately decreased function. The left ventricle  demonstrates global hypokinesis.   2. Right ventricular systolic function is normal. The right ventricular  size is normal.   3. Left atrial size was severely dilated. No left atrial/left atrial  appendage thrombus was detected.   4. The mitral valve is normal in structure. Mild mitral valve  regurgitation. No evidence of mitral  stenosis.   5. The aortic valve is tricuspid. Aortic valve regurgitation is not  visualized. No aortic stenosis is present.   6. There is mild (Grade II) atheroma plaque involving the aortic arch and  descending aorta.   7. The inferior vena cava is normal in size with greater than 50%  respiratory variability, suggesting right atrial pressure of 3 mmHg.   8. Agitated saline contrast bubble study was positive with shunting  observed within 3-6 cardiac cycles suggestive of interatrial shunt. There  is a  small patent foramen ovale with predominantly left to right shunting  across the atrial septum.   Cardioversion for atrial fibrillation with RVR to follow: normal sinus  rhythm restored  __________   2D echo 12/22/2023: 1. Left ventricular ejection fraction, by estimation, is 55 to 60%. Left  ventricular ejection fraction by PLAX is 66 %. The left ventricle has  normal function. The left ventricle has no regional wall motion  abnormalities. There is mild left ventricular  hypertrophy. Left ventricular diastolic parameters are indeterminate.   2. Right ventricular systolic function is normal. The right ventricular  size is normal.   3. The mitral valve is normal in structure. No evidence of mitral valve  regurgitation.   4. The aortic valve was not well visualized. Aortic valve regurgitation  is not visualized.  __________   2D echo 04/27/2022: 1. Left ventricular ejection fraction, by estimation, is 55 to 60%. The  left ventricle has normal function. The left ventricle has no regional  wall motion abnormalities. Left ventricular diastolic parameters are  consistent with Grade II diastolic  dysfunction (pseudonormalization). The average left ventricular global  longitudinal strain is -19.4 %. The global longitudinal strain is normal.   2. Right ventricular systolic function is normal. The right ventricular  size is normal.   3. The mitral valve is normal in structure. No evidence of mitral valve  regurgitation.   4. The aortic valve is tricuspid. Aortic valve regurgitation is trivial.  __________   Zio patch 07/2021: The patient was monitored for 13 days, 19 hours. The predominant rhythm was sinus with an average rate of 63 bpm (range 50-102 bpm in sinus). There were rare PACs and PVCs. One episode of nonsustained ventricular tachycardia was observed, lasting 5 beats with a maximum rate of 160 bpm. There were 78 atrial runs lasting up to 15 beats with a maximum rate of 171 bpm. No  sustained arrhythmia (including atrial fibrillation/flutter) or prolonged pause was observed. Patient triggered event corresponds to sinus rhythm.   Predominantly sinus rhythm with rare PACs and PVCs.  Multiple episodes of brief PSVT noted as well as one short run of NSVT.  No atrial fibrillation/flutter identified. __________   2D echo 06/26/2021: 1. Left ventricular ejection fraction, by estimation, is 45 to 50%. The  left ventricle has mildly decreased function. The left ventricle  demonstrates global hypokinesis. There is moderate concentric left  ventricular hypertrophy. Left ventricular  diastolic parameters are indeterminate. Elevated left ventricular  end-diastolic pressure.   2. Right ventricular systolic function is normal. The right ventricular  size is normal. There is normal pulmonary artery systolic pressure.   3. Left atrial size was mildly dilated.   4. The mitral valve is normal in structure. Mild mitral valve  regurgitation. No evidence of mitral stenosis.   5. The aortic valve is tricuspid. There is mild calcification of the  aortic valve. Aortic valve regurgitation is not visualized. No aortic  stenosis is present.  6. Aortic dilatation noted. There is mild dilatation of the aortic root,  measuring 41 mm. There is mild dilatation of the ascending aorta,  measuring 39 mm.   7. The inferior vena cava is dilated in size with <50% respiratory  variability, suggesting right atrial pressure of 15 mmHg. __________   Zio patch 06/2019: The patient was monitored for 13 days, 19 hours. The predominant rhythm was sinus with an average rate of 74 bpm (range 54 to 120 bpm in sinus). Occasional PACs (~4% burden) and rare PVCs were noted. There were 125 episodes of paroxysmal supraventricular tachycardia lasting up to 17.5 seconds with a maximum rate of 187 bpm. No sustained arrhythmia or prolonged pause was identified. There were no patient triggered events.   Predominantly  sinus rhythm with occasional PACs and rare PVCs.  Multiple episodes of PSVT noted, lasting up to 17.5 seconds. __________   2D echo 06/09/2019: 1. Left ventricular ejection fraction, by estimation, is 55 to 60%. The  left ventricle has normal function. The left ventricle has no regional  wall motion abnormalities. Left ventricular diastolic parameters were  normal.   2. Right ventricular systolic function is normal. The right ventricular  size is normal.   3. The mitral valve is normal in structure and function. Trivial mitral  valve regurgitation.   4. The aortic valve is normal in structure and function. Aortic valve  regurgitation is not visualized. __________   2D echo 03/29/2018: - Left ventricle: The cavity size was normal. Systolic function was    normal. The estimated ejection fraction was in the range of 60%    to 65%. Wall motion was normal; there were no regional wall    motion abnormalities. Left ventricular diastolic function    parameters were normal.  - Aortic valve: There was mild regurgitation.  - Left atrium: The atrium was mildly dilated.  - Right ventricle: Systolic function was normal.  - Atrial septum: Suspected small patent foramen ovale by color flow    Doppler. .  - Pulmonary arteries: Systolic pressure was within the normal    range. __________   2D echo 10/05/2015: - Left ventricle: The cavity size was mildly dilated. There was    mild concentric hypertrophy. Systolic function was normal. The    estimated ejection fraction was in the range of 50% to 55%. Wall    motion was normal; there were no regional wall motion    abnormalities. Left ventricular diastolic function parameters    were normal.  - Aortic valve: There was trivial regurgitation.  - Mitral valve: There was mild regurgitation.  - Left atrium: The atrium was mildly dilated. __________   Lexiscan  MPI 10/02/2015: There was no ST segment deviation noted during stress. No T wave inversion  was noted during stress. The study is normal. This is a low risk study. The left ventricular ejection fraction is mildly decreased (45-54%). EF appears close to normal visually. recommend an echocardiogram   Patient Profile     83 y.o. male with history of HFrEF, PAF, CKD stage IIIa, anemia, CVA, subarachnoid hemorrhage, HTN, HLD, colitis with prior GI bleeding, prostate cancer, and alcohol and tobacco use who is being seen today for the evaluation of acute on chronic HFrEF at the request of Dr. Hilma.   Assessment & Plan   Acute on chronic HF with recovered EF secondary to NICM and moderate pericardial effusion - IV lasix  60mg  BID - GDMT held with hypotension (Jardiance 10mg  daily, Toprol  50mg  daily, torsemide  20mg  daily) - echo this admission showed recovered LVSF, new moderate pericardial effusion without tamponade - the patient remains hemodynamically stable - repeat limited echo showed LVEF 60-65%, no WMA, mild concentric LVH, pericardial is unchanged moderate effusion with no tamponade - Net -4.8L - he reports persistent DOE. I will check BNP and CXR.  - continue with diuresis for now. May need to hold pm dose of lasix  due to kidney function, will defer to MD.   Pericardial effusion - repeat limited echo showed unchanged moderate effusion with no tamponade - can repeat limited echo in a few days   Persistent Afib - maintaining NSR after TEE/DCCV 12/2023 - continue amiodarone  200mg  daily - Eliquis  held given effusion/possible hemopericardium   AKI on CKD stage 3 - suspected 2/2 hypovolemia - improving with IV diuresis - Scr 1.93>Scr 2.23, BUN 21   Normocytic Anemia - Hgb 8-9 - Eliquis  held - Hgb 8.3 today   HTN - BP good  - GDMT held as above   Alcohol/tobacco use - complete cessation recommended   OSA - recommend CPAP at night   HLD - LDL 68 - Lipitor 40mg  daily    For questions or updates, please contact [facility] Please consult [email or web address]  for contact info under         Signed, Jibran Crookshanks VEAR Fishman, PA-C

## 2024-03-05 NOTE — Consult Note (Signed)
 Rogelia Copping, MD [facility]  [address] [place], [place] [address] Phone: [phone] Fax : [phone]  Consultation  Referring Provider:     Dr. Jerelene Primary Care Physician:  Myrla Jon [name], MD Primary Gastroenterologist:  Dr. Aundria          Reason for Consultation:     Anemia  Date of Admission:  02/29/2024 Date of Consultation:  03/05/2024         HPI:   Altariq Goodall. is a 83 y.o. male with a history of CHF and a reported ejection fraction between 30 and 35%.  The patient also has a history of stroke depression anxiety hyperlipidemia coronary artery disease chronic kidney disease and A-fib on Eliquis  with a history of IBS right bundle branch block and alcohol use.  The patient states that he drinks 2 glass of wine on a regular basis usually daily and then supplemented with some hard liquor every once in a while.  The patient had come in with leg swelling and acute on chronic heart failure.  The patient blood count had been low and has shown:  Component     Latest Ref Rng 03/02/2024 03/03/2024 03/04/2024 03/05/2024  Hemoglobin     13.0 - 17.0 g/dL 7.8 (L)  8.1 (L)  8.3 (L)  8.1 (L)   HCT     39.0 - 52.0 % 24.6 (L)    26.1 (L)     The patient's baseline hemoglobin back in September was between 10.2 and 11.5.  The patient denies any abdominal pain nausea vomiting black stools or bloody stools. The patient was found to have an iron saturation of 7 with an iron of 22 with a ferritin of 379 and a normal MCV.  The patient's particular site count was normal. It is reported that the patient has a history of normocytic anemia.  The patient's BNP was 1600 down from 1845. I am now asked to see the patient for his anemia.  Past Medical History:  Diagnosis Date  . Alcohol dependence (HCC) 09/30/2021  . Alcoholism (HCC)   . Allergy   . Aortic atherosclerosis   . Arthritis   . Ascending aorta dilation 03/28/2018   a.) Vascular US : prox asc Ao measured 29 mm.  b.)  CT CAP 06/22/2021: Ao root 41 mm. c.) TTE 06/26/2021: Ao root 41 mm; asc Ao 39 mm  . Atrial fibrillation (HCC) 1995   a.) single episode in 03/15/1994 per patient; no long term treatment. b.) recurrent episode in the setting of GI bleeding related to colitis 06/2021.  SABRA Basal cell carcinoma 04/26/2017   Right medial cheek. Superficial and nodular  . Basal cell carcinoma 06/11/2019   Left anterior shoulder. Nodular pattern  . Basal cell carcinoma 09/09/2019   Right nasal ala, [facility]  . Basal cell carcinoma 02/05/2020   L upper eyebrow, [facility]   . Basal cell carcinoma 10/12/2022   left post auricular, [facility]  . Basal cell carcinoma 05/02/2023   Right medial cheek, [facility]  . Bilateral carotid artery stenosis 06/09/2019   a.) Carotid doppler: mod; <50% BILATERAL ICAs.  . Cataract   . Chicken pox   . Colon cancer (HCC)   . Colon polyp   . Depression    Son died 2015/03/16  . Diverticulitis   . Diverticulosis 30 years  . Encounter for other preprocedural examination 06/21/2021  . Gastritis   . GERD (gastroesophageal reflux disease)   . H. pylori infection   .  History of stress test    a. 09/2015 MV: No ischemia/infarct. EF 45-54% (nl by echo).  . Hyperlipidemia   . Hypertension   . Irritable bowel syndrome   . Lacunar infarction (HCC) 06/08/2019   a.) small; RIGHT motor strip  . Myocardial infarction (HCC)   . Neuromuscular disorder (HCC)   . NSVT (nonsustained ventricular tachycardia) (HCC)    a.)  Single episode lasting 5 beats at a maximum rate of 160 bpm noted on Holter study performed 08/13/2021.  SABRA Prostate cancer (HCC) 2012   a.) s/p XRT  . PSVT (paroxysmal supraventricular tachycardia)    a. 06/2019 Zio: Avg rate 74 (54-120), occas PACs, rare PVCs, 125 episodes of PVCs (longest 17.5 secs; max rate 187). No afib.  SABRA RBBB   . SAH (subarachnoid hemorrhage) (HCC) 10/10/2018  . Sleep apnea   . Squamous cell carcinoma of skin 07/18/2017   Left medial calf. KA type  . Squamous cell carcinoma  of skin 04/24/2018   Right above med. brow  . Squamous cell carcinoma of skin 06/11/2019   Right posterior shoulder. SCCis, hypertrophic  . Squamous cell carcinoma of skin 01/09/2020   Mid nasal dorsum, MOHS, Efudex x 4wks  . Stroke (HCC)   . Substance abuse (HCC)   . Systolic dysfunction    a.) TTE 10/05/2015: EF 50-55%; mild LVH; LAE; triv AR, mild MR. b.) TTE 03/29/2018: EF 55-60%; LAE, mild AR; ? small PFO. c.) TTE 06/09/2019: EF 55-60%, no rwma, triv MR/AI. d.) TTE 06/26/2021: EF 45-50%; glob HK; LAE; mild MR; Ao root 41 mm; asc Ao 39 mm.  . Vasovagal syncope     Past Surgical History:  Procedure Laterality Date  . CARDIAC CATHETERIZATION  1998   [place] no stents  . CARDIOVERSION N/A 01/03/2024   Procedure: CARDIOVERSION;  Surgeon: Perla Evalene PARAS, MD;  Location: [facility];  Service: Cardiovascular;  Laterality: N/A;  . CARDIOVERSION N/A 01/17/2024   Procedure: CARDIOVERSION;  Surgeon: Zenaida Morene PARAS, MD;  Location: [facility];  Service: Cardiovascular;  Laterality: N/A;  . CATARACT EXTRACTION, BILATERAL    . COLON RESECTION SIGMOID N/A 12/07/2016   Procedure: COLON RESECTION SIGMOID;  Surgeon: Shelva Dunnings, MD;  Location: [facility];  Service: General;  Laterality: N/A;  . COLON SURGERY  11/2016   Colostomy  . COLONOSCOPY  2016  . COLONOSCOPY WITH PROPOFOL  N/A 05/30/2016   Procedure: COLONOSCOPY WITH PROPOFOL ;  Surgeon: Gladis RAYMOND Mariner, MD;  Location: [facility]COPY;  Service: Endoscopy;  Laterality: N/A;  . COLOSTOMY Left 12/07/2016   Procedure: COLOSTOMY;  Surgeon: Shelva Dunnings, MD;  Location: [facility];  Service: General;  Laterality: Left;  . COLOSTOMY REVERSAL N/A 03/21/2017   Procedure: COLOSTOMY REVERSAL;  Surgeon: Shelva Dunnings, MD;  Location: [facility];  Service: General;  Laterality: N/A;  . COLOSTOMY TAKEDOWN N/A 03/21/2017   Procedure: LAPAROSCOPIC COLOSTOMY TAKEDOWN;  Surgeon: Shelva Dunnings, MD;  Location: [facility];  Service: General;   Laterality: N/A;  . CYSTOSCOPY WITH STENT PLACEMENT Bilateral 03/21/2017   Procedure: CYSTOSCOPY WITH LIGHTED STENT PLACEMENT;  Surgeon: Twylla Glendia BROCKS, MD;  Location: [facility];  Service: Urology;  Laterality: Bilateral;  . ESOPHAGOGASTRODUODENOSCOPY    . ESOPHAGOGASTRODUODENOSCOPY N/A 05/30/2016   Procedure: ESOPHAGOGASTRODUODENOSCOPY (EGD);  Surgeon: Gladis RAYMOND Mariner, MD;  Location: [facility] ENDOSCOPY;  Service: Endoscopy;  Laterality: N/A;  . ESOPHAGOGASTRODUODENOSCOPY N/A 01/14/2021   Procedure: ESOPHAGOGASTRODUODENOSCOPY (EGD);  Surgeon: Toledo, Ladell POUR, MD;  Location: [facility] ENDOSCOPY;  Service: Gastroenterology;  Laterality: N/A;  . ESOPHAGOGASTRODUODENOSCOPY (EGD)  WITH PROPOFOL  N/A 04/28/2021   Procedure: ESOPHAGOGASTRODUODENOSCOPY (EGD) WITH PROPOFOL ;  Surgeon: Toledo, Ladell POUR, MD;  Location: [facility] ENDOSCOPY;  Service: Gastroenterology;  Laterality: N/A;  . EYE SURGERY     cataracts  . FLEXIBLE SIGMOIDOSCOPY N/A 03/21/2017   Procedure: FLEXIBLE SIGMOIDOSCOPY;  Surgeon: Shelva Dunnings, MD;  Location: [facility];  Service: General;  Laterality: N/A;  . FLEXIBLE SIGMOIDOSCOPY N/A 06/24/2021   Procedure: ENID MORIN;  Surgeon: Therisa Bi, MD;  Location: [facility] ENDOSCOPY;  Service: Gastroenterology;  Laterality: N/A;  . FRACTURE SURGERY Bilateral    right arm and left wrist  . INCISION AND DRAINAGE ABSCESS N/A 12/07/2016   Procedure: DRAINAGE  OF INTRA ABDOMINAL ABSCESS;  Surgeon: Shelva Dunnings, MD;  Location: [facility];  Service: General;  Laterality: N/A;  . JOINT REPLACEMENT    . LAPAROTOMY N/A 12/07/2016   Procedure: EXPLORATORY LAPAROTOMY;  Surgeon: Shelva Dunnings, MD;  Location: [facility];  Service: General;  Laterality: N/A;  . PROSTATE SURGERY     Microwave therapy  . TEE WITHOUT CARDIOVERSION N/A 01/03/2024   Procedure: ECHOCARDIOGRAM, TRANSESOPHAGEAL;  Surgeon: Perla Evalene PARAS, MD;  Location: [facility];  Service: Cardiovascular;  Laterality: N/A;  . TONSILLECTOMY     . TOTAL HIP ARTHROPLASTY Right 09/27/2021   Procedure: TOTAL HIP ARTHROPLASTY ANTERIOR APPROACH;  Surgeon: Leora Lynwood SAUNDERS, MD;  Location: [facility];  Service: Orthopedics;  Laterality: Right;  . TOTAL KNEE ARTHROPLASTY Right 07/27/2016   Procedure: TOTAL KNEE ARTHROPLASTY;  Surgeon: Kayla Pinal, MD;  Location: [facility];  Service: Orthopedics;  Laterality: Right;  Dr. Penne had to place Urinary catheter due to prostate cancer history.  Using flexible scope.    Prior to Admission medications   Medication Sig Start Date End Date Taking? Authorizing Provider  albuterol  (VENTOLIN  HFA) 108 (90 Base) MCG/ACT inhaler Inhale 2 puffs into the lungs every 6 (six) hours as needed for wheezing or shortness of breath. 09/12/23  Yes Ray, Neha, MD  Alpha-Lipoic Acid 600 MG TABS Take 1 tablet by mouth daily.   Yes [provider]  amiodarone  (PACERONE ) 200 MG tablet Take 1 tablet (200 mg total) by mouth daily. 02/13/24  Yes Zenaida Morene PARAS, MD  apixaban  (ELIQUIS ) 5 MG TABS tablet Take 1 tablet (5 mg total) by mouth 2 (two) times daily. 01/23/24  Yes Hackney, Tina A, FNP  atorvastatin  (LIPITOR) 40 MG tablet Take 1 tablet (40 mg total) by mouth daily. 02/15/24  Yes Bacigalupo, Jon [name], MD  cetirizine  (ZYRTEC ) 10 MG tablet Take 10 mg by mouth daily.   Yes [provider]  Cholecalciferol (VITAMIN D-3) 25 MCG (1000 UT) CAPS Take 1,000 Units by mouth daily.   Yes [provider]  dextromethorphan -guaiFENesin  (MUCINEX  DM) 30-600 MG 12hr tablet Take 1 tablet by mouth 2 (two) times daily as needed for cough.   Yes [provider]  docusate sodium  (COLACE) 100 MG capsule Take 1 capsule (100 mg total) by mouth 2 (two) times daily. 09/30/21  Yes Joshua Lin, PA-C  empagliflozin (JARDIANCE) 10 MG TABS tablet Take 10 mg by mouth daily.   Yes [provider]  fluticasone  (FLONASE ) 50 MCG/ACT nasal spray PLACE 1 SPRAY INTO BOTH NOSTRILS DAILY AS NEEDED FOR ALLERGIES OR  RHINITIS. 10/17/23  Yes Bacigalupo, Jon [name], MD  folic acid  (FOLVITE ) 1 MG tablet Take 1 tablet (1 mg total) by mouth daily. 12/24/23  Yes Caleen Qualia, MD  hydrocortisone  2.5 % cream APPLY TO AFFECTED AREA TWICE A DAY AS NEEDED FOR RASH 02/20/23  Yes Myrla Jon [name], MD  ketoconazole  (NIZORAL ) 2 % cream Apply once or twice daily to affected skin folds as needed for rash 05/02/23  Yes Jackquline Sawyer, MD  metoprolol  succinate (TOPROL -XL) 50 MG 24 hr tablet Take 1 tablet (50 mg total) by mouth daily. Take with or immediately following a meal. 02/13/24  Yes Zenaida Morene PARAS, MD  omeprazole  (PRILOSEC) 20 MG capsule Take 1 capsule (20 mg total) by mouth daily as needed. 09/26/23  Yes Bacigalupo, Jon [name], MD  potassium chloride  SA (KLOR-CON  M) 20 MEQ tablet Take 2 tablets (40 mEq total) by mouth daily. 01/23/24  Yes Hackney, Ellouise A, FNP  sertraline  (ZOLOFT ) 100 MG tablet Take 1 tablet (100 mg total) by mouth daily. 02/16/24  Yes Bacigalupo, Jon [name], MD  tadalafil  (CIALIS ) 5 MG tablet Take 1 tablet (5 mg total) by mouth daily as needed for erectile dysfunction. 07/07/23  Yes Bacigalupo, Jon [name], MD  thiamine  (VITAMIN B1) 100 MG tablet Take 100 mg by mouth daily.   Yes [provider]  torsemide (DEMADEX) 20 MG tablet Take 2 tablets (40 mg total) by mouth 2 (two) times daily. 02/13/24  Yes Zenaida Morene PARAS, MD  vitamin B-12 (CYANOCOBALAMIN ) 1000 MCG tablet Take 1,000 mcg by mouth daily.   Yes [provider]  zinc gluconate 50 MG tablet Take 50 mg by mouth daily.   Yes [provider]  Multiple Vitamin (MULTIVITAMIN WITH MINERALS) TABS tablet Take 1 tablet by mouth daily. Patient not taking: No sig reported 10/24/18   Vachhani, Vaibhavkumar, MD  omeprazole  (PRILOSEC) 20 MG capsule TAKE 1 CAPSULE (20 MG TOTAL) BY MOUTH DAILY. REPORTS CURRENTLY TAKING AS NEEDED Patient not taking: No sig reported 09/21/23   Myrla Jon [name], MD  tadalafil  (CIALIS ) 5 MG tablet Take 1 tablet (5 mg  total) by mouth every other day as needed. Patient not taking: No sig reported 07/05/23   Donzella Lauraine SAILOR, DO    Family History  Problem Relation Age of Onset  . Lung cancer Father        smoker  . Cancer Father   . Other Mother   . Vision loss Mother   . Sudden death Son        due to Blood clots  . Bipolar disorder Son   . Heart disease Son   . Early death Son   . Learning disabilities Son   . Kidney disease Daughter        congenital one small kidney  . Varicose Veins Sister   . Prostate cancer Neg Hx   . Bladder Cancer Neg Hx      Social History   Tobacco Use  . Smoking status: Former    Current packs/day: 0.00    Average packs/day: 1 pack/day for 27.0 years (27.0 ttl pk-yrs)    Types: Cigarettes    Start date: 04/18/1961    Quit date: 04/18/1988    Years since quitting: 35.9  . Smokeless tobacco: Current    Types: Chew    Last attempt to quit: 12/20/2021  Vaping Use  . Vaping status: Never Used  Substance Use Topics  . Alcohol use: Not Currently    Alcohol/week: 7.0 standard drinks of alcohol    Types: 7 Glasses of wine per week    Comment: 3 drinks a day.  Wine, Beer or Liquor.  . Drug use: No    Allergies as of 02/29/2024 - Review Complete 02/29/2024  Allergen Reaction Noted  . Amoxicillin -pot clavulanate  Diarrhea and Other (See Comments) 01/03/2017  . Formaldehyde Rash 01/20/2015    Review of Systems:    All systems reviewed and negative except where noted in HPI.   Physical Exam:  Vital signs in last 24 hours: Temp:  [97.6 F (36.4 C)-98 F (36.7 C)] 97.6 F (36.4 C) (11/18 0357) Pulse Rate:  [78-87] 80 (11/18 0759) Resp:  [16-19] 19 (11/18 0759) BP: (82-134)/(56-86) 134/86 (11/18 0759) SpO2:  [93 %-97 %] 95 % (11/18 0759) Weight:  [93.9 kg] 93.9 kg (11/18 0401) Last BM Date : 03/04/24 General:   Pleasant, cooperative in NAD Head:  Normocephalic and atraumatic. Eyes:   No icterus.   Conjunctiva pink. PERRLA. Ears:  Normal auditory  acuity. Neck:  Supple; no masses or thyroidomegaly Lungs: Bilateral crackles.  Heart:  Regular rate and rhythm;  Without murmur, clicks, rubs or gallops Abdomen:  Soft, nondistended, nontender. Normal bowel sounds. No appreciable masses or hepatomegaly.  No rebound or guarding.  Rectal:  Not performed. Msk:  Symmetrical without gross deformities.    Neurologic:  Alert and oriented x3;  grossly normal neurologically. Skin:  Intact without significant lesions or rashes. Cervical Nodes:  No significant cervical adenopathy. Psych:  Alert and cooperative. Normal affect.  LAB RESULTS: Recent Labs    03/03/24 0514 03/04/24 0430 03/05/24 0457  WBC  --   --  6.1  HGB 8.1* 8.3* 8.1*  HCT  --   --  26.1*  PLT  --   --  249   BMET Recent Labs    03/03/24 0514 03/04/24 0430 03/05/24 0457  NA 135 136 135  K 3.5 3.8 3.6  CL 95* 97* 94*  CO2 31 31 33*  GLUCOSE 99 106* 108*  BUN 20 20 21   CREATININE 1.86* 1.95* 2.23*  CALCIUM  8.8* 8.8* 9.1   LFT No results for input(s): PROT, ALBUMIN , AST, ALT, ALKPHOS, BILITOT, BILIDIR, IBILI in the last 72 hours. PT/INR No results for input(s): LABPROT, INR in the last 72 hours.  STUDIES: ECHOCARDIOGRAM LIMITED Result Date: 03/04/2024    ECHOCARDIOGRAM LIMITED REPORT   Patient Name:   Joriel Streety. Date of Exam: 03/03/2024 Medical Rec #:  [id number]          Height:       69.0 in Accession #:    [id number]         Weight:       208.1 lb Date of Birth:  1940-10-29          BSA:          2.101 m Patient Age:    83 years           BP:           111/82 mmHg Patient Gender: M                  HR:           80 bpm. Exam Location:  [facility] Procedure: 2D Echo, Limited Echo and Cardiac Doppler (Both Spectral and Color            Flow Doppler were utilized during procedure). Indications:     Pericardial effusion  History:         Patient has prior history of Echocardiogram examinations.  Sonographer:     [name] Rubinstein RDCS Referring Phys:   [id number] [facility] [place] Diagnosing Phys: Annabella Scarce MD  Sonographer Comments: Image acquisition challenging due to respiratory motion. IMPRESSIONS  1. Left ventricular ejection  fraction, by estimation, is 60 to 65%. The left ventricle has normal function. The left ventricle has no regional wall motion abnormalities. There is mild concentric left ventricular hypertrophy.  2. Right ventricular systolic function is normal.  3. Pericardial effusion is unchanged in size from 03/01/24. There is hyperechoic densities anterior to the right ventricle unchanged from prior concerning for thrombus or fibrinous material. No evidence of tamponade. Moderate pericardial effusion. The pericardial effusion is circumferential. There is no evidence of cardiac tamponade.  4. The inferior vena cava is normal in size with greater than 50% respiratory variability, suggesting right atrial pressure of 3 mmHg. FINDINGS  Left Ventricle: Left ventricular ejection fraction, by estimation, is 60 to 65%. The left ventricle has normal function. The left ventricle has no regional wall motion abnormalities. There is mild concentric left ventricular hypertrophy. Right Ventricle: Right ventricular systolic function is normal. Pericardium: Pericardial effusion is unchanged in size from 03/01/24. There is hyperechoic densities anterior to the right ventricle unchanged from prior concerning for thrombus or fibrinous material. No evidence of tamponade. A moderately sized pericardial effusion is present. The pericardial effusion is circumferential. The pericardial effusion appears to contain fibrous material. There is excessive respiratory variation in the tricuspid valve spectral Doppler velocities. There is no evidence of cardiac tamponade. Venous: The inferior vena cava is normal in size with greater than 50% respiratory variability, suggesting right atrial pressure of 3 mmHg. LEFT VENTRICLE PLAX 2D LVIDd:         4.30 cm LVIDs:         3.10 cm LV  PW:         1.30 cm LV IVS:        1.30 cm  LEFT ATRIUM         Index LA diam:    4.00 cm 1.90 cm/m Annabella Scarce MD Electronically signed by Annabella Scarce MD Signature Date/Time: 03/04/2024/8:43:10 AM    Final       Impression / Plan:   Assessment: Principal Problem:   Acute on chronic systolic CHF (congestive heart failure) (HCC) Active Problems:   Essential hypertension   Hyperlipidemia LDL goal <70   Paroxysmal atrial fibrillation (HCC)   Hypotension   CAD (coronary artery disease)   Depression with anxiety   Obesity (BMI 30-39.9)   Acute renal failure superimposed on stage 3a chronic kidney disease (HCC)   Normocytic anemia   Persistent atrial fibrillation (HCC)   Pericardial effusion   Lawayne Hartig. is a 83 y.o. y/o male with what appears to be a mixed picture of normocytic anemia and iron deficiency anemia with low iron stores and normal MCV with chronic renal failure and a reticulocyte count that is normal.  The patient has a low ejection fraction and has been admitted with CHF exacerbation.  Although the patient's hemoglobin has been down from his baseline and has remained stable throughout the hospital stay.  Plan:  This patient is a high risk for any endoscopic procedure with his low ejection fraction and his acute on chronic heart failure.  The patient has a primary gastroenterologist who is Dr. Aundria and if an outpatient workup is desired by the patient I recommend that he follow-up with Dr. Aundria as an outpatient.  I am not planning on doing any urgent or emergent procedures on this patient with his present medical comorbidities.  I will sign off.  Please call if any further GI concerns or questions.  We would like to thank you for the  opportunity to participate in the care of Alante Tolan..   Thank you for involving me in the care of this patient.      LOS: 5 days   Rogelia Copping, MD, MD. NOLIA 03/05/2024, 11:45 AM,  Pager [phone] 7am-5pm   Check AMION for 5pm -7am coverage and on weekends   Note: This dictation was prepared with Dragon dictation along with smaller phrase technology. Any transcriptional errors that result from this process are unintentional.

## 2024-03-05 NOTE — Progress Notes (Signed)
 PROGRESS NOTE    Cameron Foster.  FMW:[id number] DOB: October 26, 1940 DOA: 02/29/2024 PCP: Myrla Jon [name], MD  Chief Complaint  Patient presents with   Leg Swelling    Hospital Course:  Cameron Foster. is a 83 y.o. male with medical history significant of CHF with EF 30 to 35%, stroke, depression with anxiety, hypertension, hyperlipidemia, prediabetes, CAD, CKD-3A, A-fib on Eliquis , right bundle blockade, IBS, obesity, alcohol use, who presents with anasarca and low blood pressure.  Was sent to ED from clinic.  Patient was admitted for acute on chronic heart failure.  Hospital course as below  Subjective: Patient was examined at the bedside,  Reports feeling better today. States feels difficulty initiating voiding and has to strain, bladder scan postvoid residual 73, will start Flomax    Objective: Vitals:   03/05/24 0357 03/05/24 0401 03/05/24 0759 03/05/24 1234  BP: 117/80  134/86 124/78  Pulse: 78  80 88  Resp: 17  19 18   Temp: 97.6 F (36.4 C)   97.8 F (36.6 C)  TempSrc:    Oral  SpO2: 93%  95% 96%  Weight:  93.9 kg    Height:        Intake/Output Summary (Last 24 hours) at 03/05/2024 1645 Last data filed at 03/05/2024 1240 Gross per 24 hour  Intake 720 ml  Output 2250 ml  Net -1530 ml   Filed Weights   03/03/24 0500 03/04/24 0500 03/05/24 0401  Weight: 94.4 kg 96 kg 93.9 kg    Examination: General: Not in acute distress.  Has anasarca Neck: Difficult to assess JVD due to obesity, no bruit, no mass felt. Cardiac: S1/S2, RRR, No murmurs, No gallops or rubs. Respiratory: has fine crackles bilaterally GI: Soft, nondistended, nontender, no rebound pain, no organomegaly, BS present. GU: No hematuria Ext: 3+ pitting leg edema bilaterally. 1+DP/PT pulse bilaterally. Musculoskeletal: No joint deformities, No joint redness or warmth, no limitation of ROM in spin. Skin: No rashes.  Neuro: Alert, oriented X3, cranial nerves II-XII grossly intact, moves all  extremities normally  Assessment & Plan:  Acute on chronic systolic CHF (congestive heart failure) Moderate pericardial effusion - Limited Echo shows EF 60 to 65% moderate LVH.  Moderate pericardial effusion without tamponade, ? Hemopericardium in the setting of AC - Lasix  60 mg bid by IV - held - GDMT on hold due to hypotension (Jardiance, metoprolol ) - No indication for pericardiocentesis, no hemodynamic compromise.  - Seen by cardiology, appreciate recs   Paroxysmal atrial fibrillation (HCC) - s/p TEE guided DCCV - Amiodarone  200mg  daily - CHA2DS2-VASc score 7, on Eliquis  - held due to concern for hemopericardium - Hold metoprolol  due to soft blood pressure  AKI on CKD stage 3a - Recent baseline creatinine 1.83 on 02/01/2024.  Likely due to cardiorenal syndrome - Creatinine initially improved, worsened today to 2.23 - Lasix  held - Follow-up by BMP  Hypokalemia - Mag normal - Monitor and replete as needed   Essential hypertension: - Hold metoprolol  - Patient is on IV Lasix    CAD (coronary artery disease): No chest pain - Lipitor   Normocytic anemia: Hemoglobin 8.5 -> 7.8 -> 8.1 (10.2 on 01/04/2024).  Patient denies rectal bleeding or dark stool. - Eliquis  on hold due to concern for hemopericardium - Iron panel with elevated ferritin consistent with ACD. Retic count normal, FA and B12 normal - Seen by GI, appreciate recs.  High risk for endoscopic procedure due to multiple comorbidities.  Can follow-up with his GI provider Dr.  [facility] outpatient once stable   Hyperlipidemia LDL goal <70 -Lipitor   Depression with anxiety -Zoloft   OSA - CPAP at night  Alcohol and tobacco use - cessation counseling - FA, MV, B1  ? BPH - Difficulty initiating and has to strain to void - Trial of Flomax    Obesity (BMI 30-39.9): Patient has Obesity Class I, with body weight 94.8 Kg and BMI 30.86 kg/m2.  - Encourage losing weight - Exercise and healthy diet  - PT/OT rec Home  PT/OT  DVT prophylaxis: SCD   Code Status: Full Code Disposition:  TBD  Consultants:  Treatment Team:  Consulting Physician: Mady Bruckner, MD Consulting Physician: Perla Evalene PARAS, MD  Procedures:  None  Antimicrobials:  Anti-infectives (From admission, onward)    None       Data Reviewed: I have personally reviewed following labs and imaging studies CBC: Recent Labs  Lab 02/29/24 2250 03/01/24 0454 03/01/24 1825 03/02/24 0538 03/03/24 0514 03/04/24 0430 03/05/24 0457  WBC 7.2 6.2 7.5 5.6  --   --  6.1  HGB 8.7* 8.2* 8.0* 7.8* 8.1* 8.3* 8.1*  HCT 28.7* 26.5* 25.7* 24.6*  --   --  26.1*  MCV 90.5 89.8 88.6 86.9  --   --  90.0  PLT 224 231 220 228  --   --  249   Basic Metabolic Panel: Recent Labs  Lab 02/29/24 1442 03/01/24 0454 03/02/24 0538 03/03/24 0514 03/04/24 0430 03/05/24 0457  NA 137 137 136 135 136 135  K 4.5 3.9 3.6 3.5 3.8 3.6  CL 100 99 99 95* 97* 94*  CO2 28 30 31 31 31  33*  GLUCOSE 132* 95 94 99 106* 108*  BUN 24* 23 22 20 20 21   CREATININE 2.37* 2.22* 2.02* 1.86* 1.95* 2.23*  CALCIUM  9.0 9.0 8.8* 8.8* 8.8* 9.1  MG 2.2  --   --  2.2 2.3  --    GFR: Estimated Creatinine Clearance: 28.4 mL/min (A) (by C-G formula based on SCr of 2.23 mg/dL (H)). Liver Function Tests: Recent Labs  Lab 03/02/24 0538  AST 21  ALT 12  ALKPHOS 68  BILITOT 0.5  PROT 6.3*  ALBUMIN  3.3*   CBG: Recent Labs  Lab 02/29/24 2158  GLUCAP 123*    No results found for this or any previous visit (from the past 240 hours).   Radiology Studies: DG Chest 2 View Result Date: 03/05/2024 CLINICAL DATA:  Shortness of breath. EXAM: CHEST - 2 VIEW COMPARISON:  02/29/2024. FINDINGS: Low lung volumes. Stable cardiomegaly. Aortic atherosclerosis. Bilateral diffuse hazy opacities, favoring pulmonary edema. No sizable pleural effusion or pneumothorax. Degenerative changes of the spine. No acute osseous abnormality. IMPRESSION: Cardiomegaly with bilateral diffuse  hazy airspace opacities, favoring pulmonary edema. Electronically Signed   By: Harrietta Sherry M.D.   On: 03/05/2024 15:34     Scheduled Meds:  amiodarone   200 mg Oral Daily   atorvastatin   40 mg Oral Daily   cholecalciferol  1,000 Units Oral Daily   cyanocobalamin   1,000 mcg Oral Daily   folic acid   1 mg Oral Daily   loratadine   10 mg Oral Daily   pantoprazole   40 mg Oral Daily   sertraline   100 mg Oral Daily   tamsulosin   0.4 mg Oral Daily   thiamine   100 mg Oral Daily   zinc sulfate (50mg  elemental zinc)  220 mg Oral Daily   Continuous Infusions:   LOS: 5 days  MDM: Patient is high risk for one or  more organ failure.  They necessitate ongoing hospitalization for continued IV therapies and subsequent lab monitoring. Total time spent interpreting labs and vitals, reviewing the medical record, coordinating care amongst consultants and care team members, directly assessing and discussing care with the patient and/or family: 55 min Laree Lock, MD [facility]  To contact the attending physician between 7A-7P please use Epic Chat. To contact the covering physician during after hours 7P-7A, please review Amion.  03/05/2024, 4:45 PM   *This document has been created with the assistance of dictation software. Please excuse typographical errors. *

## 2024-03-05 NOTE — Progress Notes (Signed)
 Physical Therapy Treatment Patient Details Name: Cameron Foster. MRN: [id number] DOB: [birth year] Today's Date: 03/05/2024   History of Present Illness Pt is an 83 y/o M admitted on 02/29/24 after presenting with c/o worsening LE edema, abdominal swelling, referred from cardiology office with anasarca & low BP. Pt is being treated for acute on chronic systolic CHF. PMH: CHF, stroke, depression, anxiety, HTN, HLD, prediabetes, CAD, CKD3A, a-fib on Eliquis , R bundle blockade, IBS, obesity, alcohol use, SAH, prostate CA, colitis    PT Comments  Arrived to pt in supine in bed with nurse and family members present. Pt stated that he has a hard time eating in bed and would prefer to eat in the recliner. Pt is A and O x4 and was prepared to complete transfers and ambulate. Measured BP in supine to be 97/64 (74). RN was present during treatment. Pt reported not having any dizziness during transfers and ambulation. Pt completed supine to sit with Min A, HHA, and use of rails to EOB. Pt completed STS to RW with Min A. Needed some cueing to push up off bed and to stand still within RW. Ambulated 25 ft with decreased step length. Pt performed stand to sit to EOB from RW with Min A. Pt completed sit to supine with Mod A to move legs. Due to lack of 24/7 at home assistance, recommending to SNF.    If plan is discharge home, recommend the following: A little help with walking and/or transfers;A little help with bathing/dressing/bathroom;Assist for transportation;Help with stairs or ramp for entrance   Can travel by private vehicle        Equipment Recommendations  BSC/3in1    Recommendations for Other Services       Precautions / Restrictions Precautions Precautions: Fall Recall of Precautions/Restrictions: Intact Restrictions Weight Bearing Restrictions Per Provider Order: No     Mobility  Bed Mobility Overal bed mobility: Needs Assistance Bed Mobility: Supine to Sit, Sit to Supine     Supine  to sit: HOB elevated, Supervision, Used rails Sit to supine: Mod assist   General bed mobility comments: Mod A to help position legs and some cueing to position into middle of bed    Transfers Overall transfer level: Needs assistance Equipment used: Rolling walker (2 wheels) Transfers: Sit to/from Stand Sit to Stand: Min assist           General transfer comment: Cueing to push up off bed and to stand tall in RW    Ambulation/Gait Ambulation/Gait assistance: Min assist Gait Distance (Feet): 25 Feet Assistive device: Rolling walker (2 wheels) Gait Pattern/deviations: Decreased step length - left, Decreased step length - right, Decreased stride length       General Gait Details: education to ambulate within base of AD          Balance Overall balance assessment: Needs assistance Sitting-balance support: Feet supported Sitting balance-Leahy Scale: Good     Standing balance support: During functional activity, Bilateral upper extremity supported, Reliant on assistive device for balance Standing balance-Leahy Scale: Fair Standing balance comment: fatigues quickly, generally requires BUE support for standing activity.       Communication Communication Communication: No apparent difficulties  Cognition Arousal: Alert Behavior During Therapy: WFL for tasks assessed/performed   PT - Cognitive impairments: No apparent impairments         Following commands: Intact      Cueing Cueing Techniques: Verbal cues  Exercises      General Comments General comments (skin integrity,  edema, etc.): Measured BP in supine to be 97/64 (74). RN was present during treatment. No symptoms of dizziness throughout other transfers and ambulation      Pertinent Vitals/Pain Pain Assessment Pain Assessment: No/denies pain     PT Goals (current goals can now be found in the care plan section) Acute Rehab PT Goals Patient Stated Goal: get better Progress towards PT goals:  Progressing toward goals    Frequency    Min 2X/week      PT Plan      Co-evaluation              AM-PAC PT 6 Clicks Mobility   Outcome Measure  Help needed turning from your back to your side while in a flat bed without using bedrails?: A Little Help needed moving from lying on your back to sitting on the side of a flat bed without using bedrails?: A Little Help needed moving to and from a bed to a chair (including a wheelchair)?: A Little Help needed standing up from a chair using your arms (e.g., wheelchair or bedside chair)?: A Little Help needed to walk in hospital room?: A Little Help needed climbing 3-5 steps with a railing? : A Little 6 Click Score: 18    End of Session Equipment Utilized During Treatment: Gait belt Activity Tolerance: Patient tolerated treatment well Patient left: in bed;with call bell/phone within reach;with bed alarm set Nurse Communication: Mobility status PT Visit Diagnosis: Muscle weakness (generalized) (M62.81);Other abnormalities of gait and mobility (R26.89)     Time: 8470-8444 PT Time Calculation (min) (ACUTE ONLY): 26 min  Charges:    $Gait Training: 8-22 mins $Therapeutic Activity: 8-22 mins PT General Charges $$ ACUTE PT VISIT: 1 Visit                     Signe Cameron Foster SPTA    Cameron Foster 03/05/2024, 4:42 PM

## 2024-03-05 NOTE — Progress Notes (Signed)
 Occupational Therapy Treatment Patient Details Name: Cameron Foster. MRN: [id number] DOB: Jul 05, 1940 Today's Date: 03/05/2024   History of present illness Pt is an 83 y/o M admitted on 02/29/24 after presenting with c/o worsening LE edema, abdominal swelling, referred from cardiology office with anasarca & low BP. Pt is being treated for acute on chronic systolic CHF. PMH: CHF, stroke, depression, anxiety, HTN, HLD, prediabetes, CAD, CKD3A, a-fib on Eliquis , R bundle blockade, IBS, obesity, alcohol use, SAH, prostate CA, colitis   OT comments  Cameron Foster was seen for OT treatment on this date. Upon arrival to room pt supine in bed, family supports at bedside, agreeable to OT Tx session. OT facilitated ADL management with education and assistance as described below. See ADL section for additional details regarding occupational performance. Pt continues to be functionally limited by decreased activity tolerance, limited cardiopulmonary status, and decreased generalized weakness. Pt return verbalizes understanding of education provided t/o session. Pt is progressing toward OT goals and continues to benefit from skilled OT services to maximize return to PLOF and minimize risk of future falls, injury, and readmission. Will continue to follow POC as written. Discharge recommendation remains appropriate.        If plan is discharge home, recommend the following:  A little help with walking and/or transfers;A little help with bathing/dressing/bathroom;Assistance with cooking/housework;Assist for transportation;Help with stairs or ramp for entrance   Equipment Recommendations  BSC/3in1    Recommendations for Other Services      Precautions / Restrictions Precautions Precautions: Fall Restrictions Weight Bearing Restrictions Per Provider Order: No       Mobility Bed Mobility Overal bed mobility: Needs Assistance Bed Mobility: Supine to Sit, Sit to Supine     Supine to sit: HOB elevated,  Supervision, Used rails Sit to supine: Mod assist   General bed mobility comments: MOD A for return to supine. Pt requires assist to bring BLE over EOB and to adjust position in bed.    Transfers Overall transfer level: Needs assistance Equipment used: Rolling walker (2 wheels) Transfers: Sit to/from Stand Sit to Stand: Contact guard assist           General transfer comment: cuing re: push to stand vs BUE pulling up on RW     Balance Overall balance assessment: Needs assistance Sitting-balance support: Feet supported Sitting balance-Leahy Scale: Good     Standing balance support: During functional activity, Bilateral upper extremity supported, Reliant on assistive device for balance Standing balance-Leahy Scale: Fair Standing balance comment: fatigues quickly, generally requires BUE support for standing activity.                           ADL either performed or assessed with clinical judgement   ADL Overall ADL's : Needs assistance/impaired     Grooming: Standing;Oral care;Wash/dry face Grooming Details (indicate cue type and reason): Performs standing grooming at sink. Initiates oral care standing without UE support but tends to lean onto tripod position on counter for support. Educated on energy conservation, body mechanics, and activity pacing to maximize safety and functional independence.                             Functional mobility during ADLs: Contact guard assist;Rolling walker (2 wheels);Cueing for sequencing General ADL Comments: Fatigues quickly with functional activity. CGA for safety t/o session. SpO2 remains WFL, 90-96% with activity. HR remains in low 90's at rest  and with activity.    Extremity/Trunk Assessment Upper Extremity Assessment Upper Extremity Assessment: Generalized weakness   Lower Extremity Assessment Lower Extremity Assessment: Generalized weakness        Vision Patient Visual Report: No change from  baseline     Perception     Praxis     Communication Communication Communication: No apparent difficulties   Cognition Arousal: Alert Behavior During Therapy: WFL for tasks assessed/performed Cognition: No apparent impairments                               Following commands: Intact        Cueing   Cueing Techniques: Verbal cues  Exercises Other Exercises Other Exercises: OT facilitated ADL management with education and assistance as described above. See ADL section for additional details.    Shoulder Instructions       General Comments      Pertinent Vitals/ Pain       Pain Assessment Pain Assessment: No/denies pain  Home Living                                          Prior Functioning/Environment              Frequency  Min 2X/week        Progress Toward Goals  OT Goals(current goals can now be found in the care plan section)  Progress towards OT goals: Progressing toward goals  Acute Rehab OT Goals Patient Stated Goal: to go home OT Goal Formulation: With patient Time For Goal Achievement: 03/17/24 Potential to Achieve Goals: Fair  Plan      Co-evaluation                 AM-PAC OT 6 Clicks Daily Activity     Outcome Measure   Help from another person eating meals?: None Help from another person taking care of personal grooming?: A Little Help from another person toileting, which includes using toliet, bedpan, or urinal?: A Little Help from another person bathing (including washing, rinsing, drying)?: A Lot Help from another person to put on and taking off regular upper body clothing?: A Little Help from another person to put on and taking off regular lower body clothing?: A Lot 6 Click Score: 17    End of Session Equipment Utilized During Treatment: Rolling walker (2 wheels)  OT Visit Diagnosis: Unsteadiness on feet (R26.81);Repeated falls (R29.6);Muscle weakness (generalized) (M62.81)    Activity Tolerance Patient tolerated treatment well   Patient Left in chair;with call bell/phone within reach;with chair alarm set   Nurse Communication Mobility status        Time: 8690-8663 OT Time Calculation (min): 27 min  Charges: OT General Charges $OT Visit: 1 Visit OT Treatments $Self Care/Home Management : 23-37 mins  Jhonny Pelton, M.S., OTR/L 03/05/24, 2:49 PM

## 2024-03-06 DIAGNOSIS — I9589 Other hypotension: Secondary | ICD-10-CM | POA: Diagnosis not present

## 2024-03-06 DIAGNOSIS — I509 Heart failure, unspecified: Secondary | ICD-10-CM

## 2024-03-06 DIAGNOSIS — I5023 Acute on chronic systolic (congestive) heart failure: Secondary | ICD-10-CM | POA: Diagnosis not present

## 2024-03-06 DIAGNOSIS — D649 Anemia, unspecified: Secondary | ICD-10-CM | POA: Diagnosis not present

## 2024-03-06 DIAGNOSIS — I3139 Other pericardial effusion (noninflammatory): Secondary | ICD-10-CM | POA: Diagnosis not present

## 2024-03-06 DIAGNOSIS — I25118 Atherosclerotic heart disease of native coronary artery with other forms of angina pectoris: Secondary | ICD-10-CM

## 2024-03-06 DIAGNOSIS — R5383 Other fatigue: Secondary | ICD-10-CM

## 2024-03-06 DIAGNOSIS — I4819 Other persistent atrial fibrillation: Secondary | ICD-10-CM | POA: Diagnosis not present

## 2024-03-06 LAB — CBC
HCT: 25.8 % — ABNORMAL LOW (ref 39.0–52.0)
Hemoglobin: 7.9 g/dL — ABNORMAL LOW (ref 13.0–17.0)
MCH: 27.5 pg (ref 26.0–34.0)
MCHC: 30.6 g/dL (ref 30.0–36.0)
MCV: 89.9 fL (ref 80.0–100.0)
Platelets: 247 K/uL (ref 150–400)
RBC: 2.87 MIL/uL — ABNORMAL LOW (ref 4.22–5.81)
RDW: 14.8 % (ref 11.5–15.5)
WBC: 5.7 K/uL (ref 4.0–10.5)
nRBC: 0 % (ref 0.0–0.2)

## 2024-03-06 LAB — BLOOD GAS, VENOUS
Acid-Base Excess: 10.7 mmol/L — ABNORMAL HIGH (ref 0.0–2.0)
Bicarbonate: 38.5 mmol/L — ABNORMAL HIGH (ref 20.0–28.0)
O2 Saturation: 59.3 %
Patient temperature: 37
pCO2, Ven: 65 mmHg — ABNORMAL HIGH (ref 44–60)
pH, Ven: 7.38 (ref 7.25–7.43)
pO2, Ven: 35 mmHg (ref 32–45)

## 2024-03-06 LAB — PREPARE RBC (CROSSMATCH)

## 2024-03-06 LAB — BASIC METABOLIC PANEL WITH GFR
Anion gap: 8 (ref 5–15)
BUN: 24 mg/dL — ABNORMAL HIGH (ref 8–23)
CO2: 31 mmol/L (ref 22–32)
Calcium: 9 mg/dL (ref 8.9–10.3)
Chloride: 96 mmol/L — ABNORMAL LOW (ref 98–111)
Creatinine, Ser: 2.11 mg/dL — ABNORMAL HIGH (ref 0.61–1.24)
GFR, Estimated: 30 mL/min — ABNORMAL LOW (ref >60)
Glucose, Bld: 93 mg/dL (ref 70–99)
Potassium: 3.6 mmol/L (ref 3.5–5.1)
Sodium: 136 mmol/L (ref 135–145)

## 2024-03-06 MED ORDER — FUROSEMIDE 10 MG/ML IJ SOLN: 20.0000 mg | Freq: Once | INTRAMUSCULAR | Status: DC

## 2024-03-06 MED ORDER — SODIUM CHLORIDE 0.9% IV SOLUTION: Freq: Once | INTRAVENOUS | Status: DC

## 2024-03-06 NOTE — Assessment & Plan Note (Signed)
 -  Continue Lipitor

## 2024-03-06 NOTE — TOC Progression Note (Signed)
 Transition of Care (TOC) - Progression Note    Patient Details  Name: Cameron Foster. MRN: [id number] Date of Birth: 1940/08/27  Transition of Care [facility] CM/SW Contact  Lauraine JAYSON Carpen, LCSW Phone Number: 03/06/2024, 11:30 AM  Clinical Narrative:   Patient is agreeable to SNF placement. Will follow up with bed offers once available.  Expected Discharge Plan: Home w Home Health Services Barriers to Discharge: Continued Medical Work up               Expected Discharge Plan and Services       Living arrangements for the past 2 months: Single Family Home                                       Social Drivers of Health (SDOH) Interventions SDOH Screenings   Food Insecurity: No Food Insecurity (03/01/2024)  Housing: Low Risk  (03/01/2024)  Transportation Needs: No Transportation Needs (03/01/2024)  Utilities: Not At Risk (03/01/2024)  Alcohol Screen: Medium Risk (01/28/2024)  Depression (PHQ2-9): High Risk (02/05/2024)  Financial Resource Strain: Low Risk  (01/28/2024)  Physical Activity: Inactive (01/28/2024)  Social Connections: Moderately Integrated (03/01/2024)  Stress: No Stress Concern Present (01/28/2024)  Recent Concern: Stress - Stress Concern Present (11/24/2023)  Tobacco Use: High Risk (02/29/2024)  Health Literacy: Adequate Health Literacy (05/31/2023)    Readmission Risk Interventions     No data to display

## 2024-03-06 NOTE — Progress Notes (Addendum)
 Mobility Specialist Progress Note:    03/06/24 1018  Mobility  Activity Refused and notified nurse if applicable   Pt kindly refused mobility, requests this dino come back at a later time. All needs met.  Addendum 1551: deferred d/t low BP, will re-attempt on a later date as appropriate.  Sherrilee Ditty Mobility Specialist Please contact via Special Educational Needs Teacher or  Rehab office at [phone]

## 2024-03-06 NOTE — Assessment & Plan Note (Addendum)
 Slightly orthostatic but asymptomatic.  Will decrease Toprol  dose for tomorrow.

## 2024-03-06 NOTE — Assessment & Plan Note (Addendum)
 Ejection fraction has improved from September.  EF now 60 to 65%.  Cardiology restarted torsemide , Farxiga  and Toprol  on 11/20.  Continue to watch blood pressure closely.

## 2024-03-06 NOTE — Assessment & Plan Note (Addendum)
 Could be secondary to anemia since felt better today.  Patient did not wear the BiPAP last night.  Asked nursing staff to get him a BiPAP tonight.  Borderline elevation of pCO2.

## 2024-03-06 NOTE — Assessment & Plan Note (Addendum)
 With diuresis BMI actually went down to 29.82.  (Previously class I obesity)

## 2024-03-06 NOTE — Plan of Care (Signed)
   Problem: Activity: Goal: Risk for activity intolerance will decrease Outcome: Progressing

## 2024-03-06 NOTE — Progress Notes (Signed)
  Progress Note  Patient Name: Cameron Foster. Date of Encounter: 03/06/2024 [facility] Cardiologist: Lonni Hanson, MD   Interval Summary    Kidney function is stable. Patient denies chest pain or SOB . Hgb 7.9.   Vital Signs Vitals:   03/06/24 0017 03/06/24 0404 03/06/24 0500 03/06/24 0857  BP: 97/60 (!) 91/58  139/89  Pulse: 83 80  79  Resp: 18 18  19   Temp: 97.7 F (36.5 C) 98.1 F (36.7 C)  97.8 F (36.6 C)  TempSrc:      SpO2: 95% 92%  95%  Weight:   91.8 kg   Height:        Intake/Output Summary (Last 24 hours) at 03/06/2024 1158 Last data filed at 03/06/2024 0915 Gross per 24 hour  Intake 240 ml  Output 1625 ml  Net -1385 ml      03/06/2024    5:00 AM 03/05/2024    4:01 AM 03/04/2024    5:00 AM  Last 3 Weights  Weight (lbs) 202 lb 6.1 oz 207 lb 0.2 oz 211 lb 10.3 oz  Weight (kg) 91.8 kg 93.9 kg 96 kg      Telemetry/ECG  NSR 70s - Personally Reviewed  Physical Exam  GEN: No acute distress.   Neck: No JVD Cardiac: RRR, no murmurs, rubs, or gallops.  Respiratory: Clear to auscultation bilaterally. GI: Soft, nontender, non-distended  MS: No edema  Assessment & Plan   Acute on chronic HF with recovered EF secondary to NICM and moderate pericardial effusion - IV lasix  60mg  BID - GDMT held with hypotension (Jardiance 10mg  daily, Toprol  50mg  daily, torsemide 20mg  daily) - echo this admission showed recovered LVSF, new moderate pericardial effusion without tamponade - the patient remains hemodynamically stable - repeat limited echo showed LVEF 60-65%, no WMA, mild concentric LVH, pericardial is unchanged moderate effusion with no tamponade - Net -5.9L - IV lasix  held 11/20 due to worsening kidney function. Kidney function is stable, will continue to hold lasix     Pericardial effusion - repeat limited echo showed unchanged moderate effusion with no tamponade - CRP and sed rate elevated, can treat with steroids>per IM - can repeat  limited echo in a few days   Persistent Afib - maintaining NSR after TEE/DCCV 12/2023 - continue amiodarone  200mg  daily - Eliquis  held given effusion/possible hemopericardium   AKI on CKD stage 3 - suspected 2/2 hypovolemia - improving with IV diuresis - Scr 2.23>2.11   Normocytic Anemia - Hgb 8-9 - Eliquis  held - Hgb 7.9>recommend PRBC transfusion   HTN - BP good  - GDMT held as above   Alcohol/tobacco use - complete cessation recommended   OSA - recommend CPAP at night   HLD - LDL 68 - Lipitor 40mg  daily    For questions or updates, please contact [facility] Please consult [email or web address] for contact info under         Signed, Treanna Dumler VEAR Fishman, PA-C

## 2024-03-06 NOTE — Hospital Course (Addendum)
 83 y.o. male with medical history significant of CHF with EF 30 to 35%, stroke, depression with anxiety, hypertension, hyperlipidemia, prediabetes, CAD, CKD-3A, A-fib on Eliquis , right bundle blockade, IBS, obesity, alcohol use, who presents with anasarca and low blood pressure.  Was sent to ED from clinic.  Patient was admitted for acute on chronic heart failure.   11/19.  Patient feeling tired and hemoglobin drifted down to 7.3.  Patient agreeable to blood transfusion.  Limited with meds with heart failure secondary to hypotension. 11/20.  Hemoglobin up to 9.3 after transfusion.  Blood pressure did come up.  Cardiology restarted torsemide , Farxiga  and Toprol . 11/21.  Creatinine 1.86 today. 11/22.  Creatinine 1.98.  Repeat echocardiogram does not show any evidence of tamponade and EF 55 to 60%.  Weight down to 201.94 today. 11/23.  Hemoglobin 8.6.  Nursing staff weight on standing scale to get a more accurate weight. 11/24.  Hemoglobin 8.4.  Patient with slight orthostasis but was asymptomatic and able to ambulate.  IV iron  given today. 11/25.  Patient's sister wanted to take him home with her instead of going out to rehab but they did not want.  Creatinine 1.92 on discharge and hemoglobin 8.4.

## 2024-03-06 NOTE — Assessment & Plan Note (Deleted)
 Today's creatinine 1.86.  Creatinine on presentation 2.37.

## 2024-03-06 NOTE — Progress Notes (Signed)
 Progress Note   Patient: Cameron Foster. FMW:[id number] DOB: 06/14/1940 DOA: 02/29/2024     6 DOS: the patient was seen and examined on 03/06/2024   Brief hospital course: 83 y.o. male with medical history significant of CHF with EF 30 to 35%, stroke, depression with anxiety, hypertension, hyperlipidemia, prediabetes, CAD, CKD-3A, A-fib on Eliquis , right bundle blockade, IBS, obesity, alcohol use, who presents with anasarca and low blood pressure.  Was sent to ED from clinic.  Patient was admitted for acute on chronic heart failure.   11/19.  Patient feeling tired and hemoglobin drifted down to 7.3.  Patient agreeable to blood transfusion.  Limited with meds with heart failure secondary to hypotension.  Assessment and Plan: * Acute on chronic systolic CHF (congestive heart failure) (HCC) Ejection fraction has improved from September.  EF now 60 to 65%.  Lasix  and other medications on hold secondary to hypotension.  Pericardial effusion Pericardial effusion does not show any evidence of tamponade.  Hypotension Blood pressure on the lower side.  Limited with medications.  Normocytic anemia Since hemoglobin drifted down below 8 I will give a unit of blood to see if this makes him feel better.  Benefits and risks of blood transfusion explained.  Elevated ferritin goes along with anemia of chronic disease.  Paroxysmal atrial fibrillation (HCC) Eliquis  on hold for possible concern for hemopericardium.  Metoprolol  on hold with hypotension.  Patient on amiodarone .  Hyperlipidemia LDL goal <70 Continue Lipitor  Acute renal failure superimposed on stage 3a chronic kidney disease (HCC) Today's creatinine 2.11.  Creatinine on presentation 2.37.  Lowest creatinine 1.86.  Depression with anxiety On Zoloft   Obesity (BMI 30-39.9) BMI down to 29.89  Fatigue Wondering if this is secondary to anemia or slightly high pCO2 on venous blood gas.  Will give a unit of blood today and try BiPAP at  night.        Subjective: Patient feeling little tired.  Feels a little short of breath.  Admitted with anasarca and low blood pressure.  Physical Exam: Vitals:   03/06/24 1204 03/06/24 1240 03/06/24 1445 03/06/24 1504  BP: (!) 84/56 (!) 89/61 (!) 93/56 (!) 85/60  Pulse: 79 77 81 78  Resp: 18  (!) 21 18  Temp: 98.2 F (36.8 C)  98.4 F (36.9 C) 98.3 F (36.8 C)  TempSrc:   Oral Oral  SpO2: 94%  97% 97%  Weight:      Height:       Physical Exam HENT:     Head: Normocephalic.  Eyes:     General: Lids are normal.     Conjunctiva/sclera: Conjunctivae normal.  Cardiovascular:     Rate and Rhythm: Normal rate and regular rhythm.     Heart sounds: S1 normal and S2 normal. Murmur heard.     Systolic murmur is present with a grade of 2/6.  Pulmonary:     Breath sounds: Examination of the right-lower field reveals decreased breath sounds. Examination of the left-lower field reveals decreased breath sounds. Decreased breath sounds present. No wheezing, rhonchi or rales.  Musculoskeletal:     Right lower leg: Swelling present.     Left lower leg: Swelling present.  Skin:    General: Skin is warm.     Findings: No rash.  Neurological:     Mental Status: He is alert.     Data Reviewed: I once count 5.7, hemoglobin 7.9, platelet count 247 creatinine 2.11  Family Communication: Spoke with sister on the phone  Disposition: Status is: Inpatient Remains inpatient appropriate because: Will transfuse 1 unit of packed red blood cells today  Planned Discharge Destination: Home    Time spent: 28 minutes  Author: Charlie Patterson, MD 03/06/2024 4:35 PM  For on call review [email or web address].

## 2024-03-06 NOTE — Assessment & Plan Note (Addendum)
 Cardiology restarted Eliquis  on 11/20.  Patient on amiodarone .  Cardiology restarted Toprol  on 11/20.

## 2024-03-06 NOTE — Assessment & Plan Note (Addendum)
 Pericardial effusion does not show any evidence of tamponade on echocardiogram today.  Normal EF.  Cardiology recommended prednisone  20 mg daily for 2 weeks then tapering dose after that.

## 2024-03-06 NOTE — Assessment & Plan Note (Signed)
 On Zoloft.

## 2024-03-06 NOTE — Assessment & Plan Note (Addendum)
 Patient given 1 unit of packed red blood cells on 11/19.  Hemoglobin 8.8.

## 2024-03-06 NOTE — NC FL2 (Signed)
 [facility] [facility]  MEDICAID FL2 LEVEL OF CARE FORM     IDENTIFICATION  Patient Name: Cameron Foster. Birthdate: 1941-01-27 Sex: male Admission Date (Current Location): 02/29/2024  [facility] [place] Number:  Chiropodist and Address:  [facility], [address], [place], [place] [address]      Provider Number: [id number]  Attending Physician Name and Address:  Josette Ade, MD  Relative Name and Phone Number:       Current Level of Care: Hospital Recommended Level of Care: Skilled Nursing Facility Prior Approval Number:    Date Approved/Denied:   PASRR Number: [id number]  Discharge Plan: SNF    Current Diagnoses: Patient Active Problem List   Diagnosis Date Noted   Iron deficiency anemia due to chronic blood loss 03/05/2024   Persistent atrial fibrillation (HCC) 03/02/2024   Pericardial effusion 03/02/2024   Acute on chronic systolic CHF (congestive heart failure) (HCC) 02/29/2024   Hypotension 02/29/2024   CAD (coronary artery disease) 02/29/2024   Depression with anxiety 02/29/2024   Obesity (BMI 30-39.9) 02/29/2024   Acute renal failure superimposed on stage 3a chronic kidney disease (HCC) 02/29/2024   Normocytic anemia 02/29/2024   CKD (chronic kidney disease) stage 2, GFR 60-89 ml/min 02/01/2024   Dilated cardiomyopathy (HCC) 01/03/2024   Chest pain 01/01/2024   Hypokalemia 12/21/2023   History of colitis 12/21/2023   OSA (obstructive sleep apnea) 03/27/2023   Prediabetes 12/26/2022   Lumbar radiculopathy 10/24/2022   Urinary problem 05/04/2022   Post-void dribbling 04/25/2022   Penile atrophy 04/25/2022   Bilateral leg weakness 04/25/2022   Gait abnormality 04/25/2022   Ataxia, 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   Tobacco abuse 12/21/2021   Chronic bronchitis (HCC) 10/13/2021   Heart failure with improved ejection fraction (HFimpEF) (HCC) 09/30/2021   Presence of right artificial hip joint 09/30/2021    Long term (current) use of anticoagulants 09/30/2021   Atherosclerosis of coronary artery without angina pectoris 09/30/2021   Allergic rhinitis 09/30/2021   Gastroesophageal reflux disease without esophagitis 09/30/2021   Irritable bowel syndrome 09/30/2021   Malignant neoplasm of skin 09/30/2021   Vitamin B deficiency 09/30/2021   Paroxysmal atrial fibrillation (HCC) 09/30/2021   History of total hip replacement, right 09/27/2021   Atrial fibrillation with rapid ventricular response (HCC)    Acute systolic heart failure (HCC) 06/26/2021   Primary squamous cell carcinoma of skin of left upper extremity 05/24/2021   Irregular Z line of esophagus 02/25/2021   Chronic pansinusitis 01/26/2021   Dizziness 01/26/2021   Melena 01/13/2021   Stage 3a chronic kidney disease (HCC) 11/23/2020   Chronic pain of right ankle 05/21/2020   Synovitis and tenosynovitis 04/30/2020   Lymphedema 03/03/2020   Chronic venous insufficiency 03/03/2020   Debility 02/14/2020   Leg edema 02/14/2020   Foraminal stenosis of lumbar region 11/08/2019   Chronic bilateral low back pain without sciatica 10/18/2019   Foraminal stenosis of cervical region 10/18/2019   History of lacunar cerebrovascular accident (CVA) 07/10/2019   History of stroke 07/01/2019   TIA (transient ischemic attack) 10/20/2018   History of subarachnoid hemorrhage 10/16/2018   Low libido 08/30/2018   Obesity 05/14/2018   Hyperlipidemia LDL goal <70 05/14/2018   Degeneration of lumbar intervertebral disc 03/22/2018   Osteoarthritis of hip 03/22/2018   Trochanteric bursitis of right hip 03/22/2018   History of colostomy reversal 03/21/2017   Status post partial colectomy 01/03/2017   Mild major depression, single episode 01/03/2017   Vasovagal syncope 01/27/2016  History of skin cancer 10/28/2015   Osteoarthritis of right knee 10/01/2015   Essential hypertension 08/25/2015   Shortness of breath 08/12/2015   Erectile dysfunction  following radiation therapy 08/04/2015   History of prostate cancer 01/20/2015   Grief 01/20/2015   Swelling of right lower extremity 01/20/2015    Orientation RESPIRATION BLADDER Height & Weight     Self, Time, Situation, Place  Normal Continent Weight: 202 lb 6.1 oz (91.8 kg) Height:  5' 9 (175.3 cm)  BEHAVIORAL SYMPTOMS/MOOD NEUROLOGICAL BOWEL NUTRITION STATUS   (None)  (None) Continent Diet (Heart healthy/carb modified. Fluid restriction: 1500 mL.)  AMBULATORY STATUS COMMUNICATION OF NEEDS Skin   Limited Assist Verbally Bruising                       Personal Care Assistance Level of Assistance  Bathing, Dressing, Feeding Bathing Assistance: Limited assistance Feeding assistance: Limited assistance Dressing Assistance: Limited assistance     Functional Limitations Info  Sight, Hearing, Speech Sight Info: Adequate Hearing Info: Adequate Speech Info: Adequate    SPECIAL CARE FACTORS FREQUENCY  PT (By licensed PT), OT (By licensed OT)     PT Frequency: 5 x week OT Frequency: 5 x week            Contractures Contractures Info: Not present    Additional Factors Info  Code Status, Allergies Code Status Info: Full code Allergies Info: Amoxicillin -pot Clavulanate, Formaldehyde           Current Medications (03/06/2024):  This is the current hospital active medication list Current Facility-Administered Medications  Medication Dose Route Frequency Provider Last Rate Last Admin   0.9 %  sodium chloride  infusion (Manually program via Guardrails IV Fluids)   Intravenous Once Josette Ade, MD       acetaminophen  (TYLENOL ) tablet 650 mg  650 mg Oral Q6H PRN Niu, Xilin, MD       albuterol  (PROVENTIL ) (2.5 MG/3ML) 0.083% nebulizer solution 2.5 mg  2.5 mg Inhalation Q4H PRN Niu, Xilin, MD       amiodarone  (PACERONE ) tablet 200 mg  200 mg Oral Daily Niu, Xilin, MD   200 mg at 03/06/24 9085   atorvastatin  (LIPITOR) tablet 40 mg  40 mg Oral Daily Niu, Xilin, MD    40 mg at 03/06/24 0913   cholecalciferol (VITAMIN D3) tablet 1,000 Units  1,000 Units Oral Daily Niu, Xilin, MD   1,000 Units at 03/06/24 9085   cyanocobalamin  (VITAMIN B12) tablet 1,000 mcg  1,000 mcg Oral Daily Niu, Xilin, MD   1,000 mcg at 03/06/24 9085   dextromethorphan -guaiFENesin  (MUCINEX  DM) 30-600 MG per 12 hr tablet 1 tablet  1 tablet Oral BID PRN Niu, Xilin, MD       fluticasone  (FLONASE ) 50 MCG/ACT nasal spray 1 spray  1 spray Each Nare Daily PRN Niu, Xilin, MD       folic acid  (FOLVITE ) tablet 1 mg  1 mg Oral Daily Niu, Xilin, MD   1 mg at 03/06/24 9085   furosemide  (LASIX ) injection 20 mg  20 mg Intravenous Once Wieting, Richard, MD       loratadine  (CLARITIN ) tablet 10 mg  10 mg Oral Daily Niu, Xilin, MD   10 mg at 03/06/24 0914   melatonin tablet 5 mg  5 mg Oral QHS PRN Mansy, Jan A, MD   5 mg at 03/05/24 2239   ondansetron  (ZOFRAN ) injection 4 mg  4 mg Intravenous Q8H PRN Niu, Xilin, MD  pantoprazole  (PROTONIX ) EC tablet 40 mg  40 mg Oral Daily Niu, Xilin, MD   40 mg at 03/06/24 0913   sertraline  (ZOLOFT ) tablet 100 mg  100 mg Oral Daily Niu, Xilin, MD   100 mg at 03/06/24 9085   tamsulosin  (FLOMAX ) capsule 0.4 mg  0.4 mg Oral Daily Ponnala, Shruthi, MD   0.4 mg at 03/06/24 9085   thiamine  (VITAMIN B1) tablet 100 mg  100 mg Oral Daily Niu, Xilin, MD   100 mg at 03/06/24 9085   zinc sulfate (50mg  elemental zinc) capsule 220 mg  220 mg Oral Daily Niu, Xilin, MD   220 mg at 03/06/24 9086     Discharge Medications: Please see discharge summary for a list of discharge medications.  Relevant Imaging Results:  Relevant Lab Results:   Additional Information SS#: [id number]  Lauraine JAYSON Carpen, LCSW

## 2024-03-07 ENCOUNTER — Other Ambulatory Visit (HOSPITAL_COMMUNITY): Payer: Self-pay

## 2024-03-07 DIAGNOSIS — I25118 Atherosclerotic heart disease of native coronary artery with other forms of angina pectoris: Secondary | ICD-10-CM | POA: Diagnosis not present

## 2024-03-07 DIAGNOSIS — I5023 Acute on chronic systolic (congestive) heart failure: Secondary | ICD-10-CM | POA: Diagnosis not present

## 2024-03-07 DIAGNOSIS — I3139 Other pericardial effusion (noninflammatory): Secondary | ICD-10-CM | POA: Diagnosis not present

## 2024-03-07 DIAGNOSIS — D638 Anemia in other chronic diseases classified elsewhere: Secondary | ICD-10-CM

## 2024-03-07 DIAGNOSIS — I4819 Other persistent atrial fibrillation: Secondary | ICD-10-CM | POA: Diagnosis not present

## 2024-03-07 DIAGNOSIS — N179 Acute kidney failure, unspecified: Secondary | ICD-10-CM | POA: Diagnosis not present

## 2024-03-07 DIAGNOSIS — D649 Anemia, unspecified: Secondary | ICD-10-CM | POA: Diagnosis not present

## 2024-03-07 DIAGNOSIS — I9589 Other hypotension: Secondary | ICD-10-CM | POA: Diagnosis not present

## 2024-03-07 LAB — TYPE AND SCREEN
ABO/RH(D): O POS
Antibody Screen: NEGATIVE
Unit division: 0

## 2024-03-07 LAB — BASIC METABOLIC PANEL WITH GFR
Anion gap: 10 (ref 5–15)
BUN: 20 mg/dL (ref 8–23)
CO2: 32 mmol/L (ref 22–32)
Calcium: 9.2 mg/dL (ref 8.9–10.3)
Chloride: 95 mmol/L — ABNORMAL LOW (ref 98–111)
Creatinine, Ser: 1.91 mg/dL — ABNORMAL HIGH (ref 0.61–1.24)
GFR, Estimated: 34 mL/min — ABNORMAL LOW (ref >60)
Glucose, Bld: 97 mg/dL (ref 70–99)
Potassium: 3.8 mmol/L (ref 3.5–5.1)
Sodium: 137 mmol/L (ref 135–145)

## 2024-03-07 LAB — BPAM RBC
Blood Product Expiration Date: 202512192359
ISSUE DATE / TIME: 202511191431
Unit Type and Rh: 5100

## 2024-03-07 LAB — HEMOGLOBIN: Hemoglobin: 9.3 g/dL — ABNORMAL LOW (ref 13.0–17.0)

## 2024-03-07 MED ORDER — APIXABAN 5 MG PO TABS
5.0000 mg | ORAL_TABLET | Freq: Two times a day (BID) | ORAL | Status: DC
  Administered 2024-03-07 (×2): 5 mg via ORAL
  Filled 2024-03-07 (×3): qty 1

## 2024-03-07 MED ORDER — DAPAGLIFLOZIN PROPANEDIOL 10 MG PO TABS
10.0000 mg | ORAL_TABLET | Freq: Every day | ORAL | Status: DC
  Administered 2024-03-07 – 2024-03-12 (×6): 10 mg via ORAL
  Filled 2024-03-07 (×6): qty 1

## 2024-03-07 MED ORDER — POTASSIUM CHLORIDE CRYS ER 20 MEQ PO TBCR
20.0000 meq | EXTENDED_RELEASE_TABLET | Freq: Every day | ORAL | Status: DC
  Administered 2024-03-07 – 2024-03-12 (×6): 20 meq via ORAL
  Filled 2024-03-07 (×6): qty 1

## 2024-03-07 MED ORDER — TORSEMIDE 20 MG PO TABS
40.0000 mg | ORAL_TABLET | Freq: Every day | ORAL | Status: DC
  Administered 2024-03-07 – 2024-03-12 (×6): 40 mg via ORAL
  Filled 2024-03-07 (×6): qty 2

## 2024-03-07 MED ORDER — METOPROLOL SUCCINATE ER 25 MG PO TB24
25.0000 mg | ORAL_TABLET | Freq: Every day | ORAL | Status: DC
  Administered 2024-03-07 – 2024-03-11 (×5): 25 mg via ORAL
  Filled 2024-03-07 (×5): qty 1

## 2024-03-07 NOTE — Progress Notes (Signed)
 Physical Therapy Treatment Patient Details Name: Cameron Foster. MRN: [id number] DOB: October 10, 1940 Today's Date: 03/07/2024   History of Present Illness Pt is an 83 y/o M admitted on 02/29/24 after presenting with c/o worsening LE edema, abdominal swelling, referred from cardiology office with anasarca & low BP. Pt is being treated for acute on chronic systolic CHF. PMH: CHF, stroke, depression, anxiety, HTN, HLD, prediabetes, CAD, CKD3A, a-fib on Eliquis , R bundle blockade, IBS, obesity, alcohol use, SAH, prostate CA, colitis    PT Comments  Pt was supine in bed with HOB elevated 20 deg with family bedside. Pt stated that he needed to go to the bathroom. Pt was A and O x4 and was eager to ambulate further than an earlier treatment. Performed supine to sit with supervision A and some cueing to get to EOB. Pt performed STS with Min A to urinate bedside. Pt was asymptomatic during transfers. Ambulated 100 ft with RW with CGA. Brought chair behind to sit halfway through ambulation and to monitor vitals. Vitals were WNL. Pt performed stand to sit from RW into recliner with CGA. Discharge recs still apply. Continue with POC.   If plan is discharge home, recommend the following: A little help with walking and/or transfers;A little help with bathing/dressing/bathroom;Assist for transportation;Help with stairs or ramp for entrance   Can travel by private vehicle        Equipment Recommendations  BSC/3in1    Recommendations for Other Services       Precautions / Restrictions Precautions Precautions: Fall Recall of Precautions/Restrictions: Intact Restrictions Weight Bearing Restrictions Per Provider Order: No     Mobility  Bed Mobility Overal bed mobility: Needs Assistance Bed Mobility: Supine to Sit     Supine to sit: HOB elevated, Used rails, Contact guard     General bed mobility comments: Some cueing needed to move to EOB. Used supervision A    Transfers Overall transfer level:  Needs assistance Equipment used: Rolling walker (2 wheels) Transfers: Sit to/from Stand Sit to Stand: Min assist           General transfer comment: Cueing to push up off bed and to stand tall in RW    Ambulation/Gait Ambulation/Gait assistance: Contact guard assist Gait Distance (Feet): 100 Feet Assistive device: Rolling walker (2 wheels) Gait Pattern/deviations: Decreased step length - left, Decreased step length - right, Decreased stride length       General Gait Details: education to ambulate within base of AD. Sometimes ambulates too fast.         Balance Overall balance assessment: Needs assistance Sitting-balance support: Feet supported Sitting balance-Leahy Scale: Good     Standing balance support: During functional activity, Bilateral upper extremity supported, Reliant on assistive device for balance, No upper extremity supported Standing balance-Leahy Scale: Fair            Hotel Manager: No apparent difficulties  Cognition Arousal: Alert Behavior During Therapy: WFL for tasks assessed/performed           Following commands: Intact      Cueing Cueing Techniques: Verbal cues  Exercises      General Comments General comments (skin integrity, edema, etc.): Pt was asymptomatic during transfers. Ambulated with chair behind for pt to sit halfway through ambulation      Pertinent Vitals/Pain Pain Assessment Pain Assessment: No/denies pain     PT Goals (current goals can now be found in the care plan section) Acute Rehab PT Goals Patient Stated Goal: get better  Progress towards PT goals: Progressing toward goals    Frequency    Min 2X/week       AM-PAC PT 6 Clicks Mobility   Outcome Measure  Help needed turning from your back to your side while in a flat bed without using bedrails?: A Little Help needed moving from lying on your back to sitting on the side of a flat bed without using bedrails?: A  Little Help needed moving to and from a bed to a chair (including a wheelchair)?: A Little Help needed standing up from a chair using your arms (e.g., wheelchair or bedside chair)?: A Lot Help needed to walk in hospital room?: A Little Help needed climbing 3-5 steps with a railing? : A Little 6 Click Score: 17    End of Session Equipment Utilized During Treatment: Gait belt Activity Tolerance: Patient tolerated treatment well Patient left: in chair;with call bell/phone within reach;with chair alarm set Nurse Communication: Mobility status PT Visit Diagnosis: Muscle weakness (generalized) (M62.81);Other abnormalities of gait and mobility (R26.89)     Time: 8499-8474 PT Time Calculation (min) (ACUTE ONLY): 25 min  Charges:    $Gait Training: 8-22 mins $Therapeutic Activity: 8-22 mins PT General Charges $$ ACUTE PT VISIT: 1 Visit                     Signe Saraiah Bhat SPTA    Shaquanda Graves 03/07/2024, 4:03 PM

## 2024-03-07 NOTE — TOC Progression Note (Addendum)
 Transition of Care (TOC) - Progression Note    Patient Details  Name: Cameron Foster. MRN: [id number] Date of Birth: 02-06-1941  Transition of Care [facility]) CM/SW Contact  Lauraine JAYSON Carpen, LCSW Phone Number: 03/07/2024, 2:44 PM  Clinical Narrative:   [facility] has offered a bed. [facility] is considering pending bed availability. CSW left a message for the [facility] admissions coordinator to see how soon they will have a bed. [facility] and [facility] have not responded on the hub but they both confirmed they are not in network with patient's insurance. [facility] [employer], [facility], and [facility] declined.  4:49 pm: Updated patient.  Expected Discharge Plan: Home w Home Health Services Barriers to Discharge: Continued Medical Work up               Expected Discharge Plan and Services       Living arrangements for the past 2 months: Single Family Home                                       Social Drivers of Health (SDOH) Interventions SDOH Screenings   Food Insecurity: No Food Insecurity (03/01/2024)  Housing: Low Risk  (03/01/2024)  Transportation Needs: No Transportation Needs (03/01/2024)  Utilities: Not At Risk (03/01/2024)  Alcohol Screen: Medium Risk (01/28/2024)  Depression (PHQ2-9): High Risk (02/05/2024)  Financial Resource Strain: Low Risk  (01/28/2024)  Physical Activity: Inactive (01/28/2024)  Social Connections: Moderately Integrated (03/01/2024)  Stress: No Stress Concern Present (01/28/2024)  Recent Concern: Stress - Stress Concern Present (11/24/2023)  Tobacco Use: High Risk (02/29/2024)  Health Literacy: Adequate Health Literacy (05/31/2023)    Readmission Risk Interventions     No data to display

## 2024-03-07 NOTE — Progress Notes (Signed)
 Progress Note   Patient: Cameron Foster. FMW:[id number] DOB: 08/16/1940 DOA: 02/29/2024     7 DOS: the patient was seen and examined on 03/07/2024   Brief hospital course: 83 y.o. male with medical history significant of CHF with EF 30 to 35%, stroke, depression with anxiety, hypertension, hyperlipidemia, prediabetes, CAD, CKD-3A, A-fib on Eliquis , right bundle blockade, IBS, obesity, alcohol use, who presents with anasarca and low blood pressure.  Was sent to ED from clinic.  Patient was admitted for acute on chronic heart failure.   11/19.  Patient feeling tired and hemoglobin drifted down to 7.3.  Patient agreeable to blood transfusion.  Limited with meds with heart failure secondary to hypotension. 11/20.  Hemoglobin up to 9.3 after transfusion.  Blood pressure did come up.  Cardiology restarted torsemide, Farxiga and Toprol .  Assessment and Plan: * Acute on chronic systolic CHF (congestive heart failure) (HCC) Ejection fraction has improved from September.  EF now 60 to 65%.  Cardiology restarted torsemide, Farxiga and Toprol   Pericardial effusion Pericardial effusion does not show any evidence of tamponade.  Hypotension Blood pressure came up this morning after blood transfusion yesterday.  Cardiology restarted medications.  Normocytic anemia Patient given 1 unit of packed red blood cells on 11/19.  Hemoglobin 9.3.  Paroxysmal atrial fibrillation [facility]) Cardiology restarted Eliquis .  Patient on amiodarone .  Cardiology restarted Toprol .  Hyperlipidemia LDL goal <70 Continue Lipitor  Acute renal failure superimposed on stage 3a chronic kidney disease (HCC) Today's creatinine 1.91.  Creatinine on presentation 2.37.  Lowest creatinine 1.86.  Depression with anxiety On Zoloft   Obesity (BMI 30-39.9) BMI up to 30.83  Fatigue Could be secondary to anemia since felt better today.  Patient did not wear the BiPAP last night.  Borderline elevation of pCO2.         Subjective: Patient feeling better today.  Was sitting up in bed.  Worked with PT and OT.  Less tired today.  Admitted with anasarca and low blood pressure.  Physical Exam: Vitals:   03/07/24 0759 03/07/24 1153 03/07/24 1536 03/07/24 1617  BP: 126/83 110/74 (!) 91/57 97/66  Pulse: 78 77 72 79  Resp: 17 17 18    Temp: 97.8 F (36.6 C) 97.7 F (36.5 C) 97.9 F (36.6 C)   TempSrc:      SpO2: 94% 97% 96%   Weight:      Height:       Physical Exam HENT:     Head: Normocephalic.  Eyes:     General: Lids are normal.     Conjunctiva/sclera: Conjunctivae normal.  Cardiovascular:     Rate and Rhythm: Normal rate and regular rhythm.     Heart sounds: S1 normal and S2 normal. Murmur heard.     Systolic murmur is present with a grade of 2/6.  Pulmonary:     Breath sounds: Examination of the right-lower field reveals decreased breath sounds. Examination of the left-lower field reveals decreased breath sounds. Decreased breath sounds present. No wheezing, rhonchi or rales.  Musculoskeletal:     Right lower leg: Swelling present.     Left lower leg: Swelling present.  Skin:    General: Skin is warm.     Findings: No rash.  Neurological:     Mental Status: He is alert.     Data Reviewed: Creatinine 1.91, chloride 95, sodium 137, potassium 3.8, CO2 32, GFR 34, hemoglobin 9.3  Family Communication: Spoke with sister and daughter on the phone  Disposition: Status is: Inpatient  Remains inpatient appropriate because: TOC need to work on english as a second language teacher and rehab.  Planned Discharge Destination: Home    Time spent: 28 minutes  Author: Charlie Patterson, MD 03/07/2024 5:43 PM  For on call review [email or web address].

## 2024-03-07 NOTE — Progress Notes (Signed)
  Progress Note  Patient Name: Cameron Foster. Date of Encounter: 03/07/2024 [facility] Cardiologist: Lonni Hanson, MD   Interval Summary    Scr 2.11>1.91. Hgb up to 9.3 after blood transfusion. The patient is feeling better . No chest pain or SOB reported.   Vital Signs Vitals:   03/06/24 2342 03/07/24 0411 03/07/24 0500 03/07/24 0759  BP: 126/72 123/85  126/83  Pulse: 85 80  78  Resp: 20 20  17   Temp: 97.8 F (36.6 C) (!) 97.4 F (36.3 C)  97.8 F (36.6 C)  TempSrc:      SpO2: 95% 93%  94%  Weight:   94.7 kg   Height:        Intake/Output Summary (Last 24 hours) at 03/07/2024 1013 Last data filed at 03/07/2024 0836 Gross per 24 hour  Intake 710 ml  Output 625 ml  Net 85 ml      03/07/2024    5:00 AM 03/06/2024    5:00 AM 03/05/2024    4:01 AM  Last 3 Weights  Weight (lbs) 208 lb 12.4 oz 202 lb 6.1 oz 207 lb 0.2 oz  Weight (kg) 94.7 kg 91.8 kg 93.9 kg      Telemetry/ECG  NSR Hr 70s - Personally Reviewed  Physical Exam  GEN: No acute distress.   Neck: No JVD Cardiac: RRR, no murmurs, rubs, or gallops.  Respiratory: Clear to auscultation bilaterally. GI: Soft, nontender, non-distended  MS: No edema  Assessment & Plan   Acute on chronic HF with recovered EF secondary to NICM and moderate pericardial effusion - IV lasix  held 11/20 due to worsening kidney function.  - GDMT held with hypotension (Jardiance 10mg  daily, Toprol  50mg  daily, torsemide 20mg  daily) - echo this admission showed recovered LVSF, new moderate pericardial effusion without tamponade - repeat limited echo showed LVEF 60-65%, no WMA, mild concentric LVH, pericardial is unchanged moderate effusion with no tamponade - Net -5.9L - Kidney function is improved,restart Torsemide 40mg  daily and Jardiance 10mg  daily, Toprol  25mg  daily and potassium   Pericardial effusion - repeat limited echo showed unchanged moderate effusion with no tamponade - CRP and sed rate elevated, can  treat with steroids>per IM - can repeat limited echo next week   Persistent Afib - maintaining NSR after TEE/DCCV 12/2023 - continue amiodarone  200mg  daily - Eliquis  held given effusion/possible hemopericardium, will restart Eliquis  - daily CBC - restart toprol  as above   AKI on CKD stage 3 - suspected 2/2 hypovolemia - improving with IV diuresis - Scr 2.23>2.11>1.91   Normocytic Anemia - Hgb 8-9 - Eliquis  held - Hgb 7.9>9.3   HTN - BP good  - GDMT held as above   Alcohol/tobacco use - complete cessation recommended   OSA - recommend CPAP at night   HLD - LDL 68 - Lipitor 40mg  daily    For questions or updates, please contact [facility] Please consult [email or web address] for contact info under         Signed, Octaviano Mukai VEAR Fishman, PA-C

## 2024-03-07 NOTE — Plan of Care (Signed)
   Problem: Education: Goal: Knowledge of General Education information will improve Description Including pain rating scale, medication(s)/side effects and non-pharmacologic comfort measures Outcome: Progressing

## 2024-03-07 NOTE — Progress Notes (Signed)
 Occupational Therapy Treatment Patient Details Name: Cameron Foster. MRN: [id number] DOB: 03/14/1941 Today's Date: 03/07/2024   History of present illness Pt is an 83 y/o M admitted on 02/29/24 after presenting with c/o worsening LE edema, abdominal swelling, referred from cardiology office with anasarca & low BP. Pt is being treated for acute on chronic systolic CHF. PMH: CHF, stroke, depression, anxiety, HTN, HLD, prediabetes, CAD, CKD3A, a-fib on Eliquis , R bundle blockade, IBS, obesity, alcohol use, SAH, prostate CA, colitis   OT comments  Pt seen for OT tx. Pt pleasant and eager to participate indicating desire to use the bathroom and get cleaned up for the day. Pt required CGA for sup>sit EOB and MIN A to stand with RW. CGA-MIN A to ambulate to the bathroom and required MIN A for descent onto the std height toilet. Educated in benefit of BSC frame over the toilet to improve transfer safety and independence. Pt required MAX A for pericare after BM in standing 2/2 impaired balance. Pt educated in AE and compensatory strategies to improve independence with pericare as he notes difficulty with completing himself. Pt required CGA in standing with intermittent UE support for washing hands, brushing teeth and partial, and washing his face. Pt sat to brush his hair as he reported that his back pain was getting a bit worse. Educated in posture and adaptive strategies to minimizing stooped position and bending over the sink. Educated in activity pacing and benefit of completing some tasks in sitting to improve safety and support EC. Pt verbalized understanding. Endorsed 4/10 rate of perceived exertion after toileting/grooming tasks. Demonstrating progress towards goals and continues to benefit. Discharge recommendation updated to reflect progress to date and limited supports at home requiring him to be more independent prior to return home.       If plan is discharge home, recommend the following:  A  little help with walking and/or transfers;A little help with bathing/dressing/bathroom;Assistance with cooking/housework;Assist for transportation;Help with stairs or ramp for entrance   Equipment Recommendations  BSC/3in1    Recommendations for Other Services      Precautions / Restrictions Precautions Precautions: Fall Recall of Precautions/Restrictions: Intact Restrictions Weight Bearing Restrictions Per Provider Order: No       Mobility Bed Mobility Overal bed mobility: Needs Assistance Bed Mobility: Supine to Sit, Sit to Supine     Supine to sit: HOB elevated, Used rails, Contact guard Sit to supine: Min assist   General bed mobility comments: MIN A for BLE mgt back to bed    Transfers Overall transfer level: Needs assistance Equipment used: Rolling walker (2 wheels) Transfers: Sit to/from Stand Sit to Stand: Min assist                 Balance Overall balance assessment: Needs assistance Sitting-balance support: Feet supported Sitting balance-Leahy Scale: Good     Standing balance support: During functional activity, Bilateral upper extremity supported, Reliant on assistive device for balance, No upper extremity supported Standing balance-Leahy Scale: Fair Standing balance comment: static without UE support fair for hand washing, fair dynamic with BUE support on RW                           ADL either performed or assessed with clinical judgement   ADL Overall ADL's : Needs assistance/impaired     Grooming: Standing;Wash/dry hands;Contact guard assist;Oral care;Wash/dry face;Brushing hair;Sitting Grooming Details (indicate cue type and reason): CGA in standing with intermittent UE support  for washing hands, brushing teeth and partial, and washing his face. Pt sat to brush his hair as he reported that his back pain was getting a bit worse. Educated in posture and adaptive strategies to minimizing stooped position and bending over the sink.  Educated in activity pacing and benefit of completing some tasks in sitting to improve safety and support EC                 Toilet Transfer: Minimal assistance;Rolling walker (2 wheels);Regular Teacher, Adult Education Details (indicate cue type and reason): rec BSC over toilet next time; required MIN A to sit on std height Toileting- Clothing Manipulation and Hygiene: Sit to/from stand;Maximal assistance Toileting - Clothing Manipulation Details (indicate cue type and reason): 2/2 decr balance     Functional mobility during ADLs: Contact guard assist;Rolling walker (2 wheels);Minimal assistance      Extremity/Trunk Assessment              Vision       Perception     Praxis     Communication     Cognition Arousal: Alert Behavior During Therapy: WFL for tasks assessed/performed Cognition: No apparent impairments                               Following commands: Intact        Cueing      Exercises      Shoulder Instructions       General Comments      Pertinent Vitals/ Pain       Pain Assessment Pain Assessment: No/denies pain  Home Living                                          Prior Functioning/Environment              Frequency  Min 2X/week        Progress Toward Goals  OT Goals(current goals can now be found in the care plan section)  Progress towards OT goals: Progressing toward goals  Acute Rehab OT Goals Patient Stated Goal: go home OT Goal Formulation: With patient Time For Goal Achievement: 03/17/24 Potential to Achieve Goals: Fair  Plan      Co-evaluation                 AM-PAC OT 6 Clicks Daily Activity     Outcome Measure   Help from another person eating meals?: None Help from another person taking care of personal grooming?: A Little Help from another person toileting, which includes using toliet, bedpan, or urinal?: A Lot Help from another person bathing (including  washing, rinsing, drying)?: A Lot Help from another person to put on and taking off regular upper body clothing?: A Little Help from another person to put on and taking off regular lower body clothing?: A Lot 6 Click Score: 16    End of Session Equipment Utilized During Treatment: Rolling walker (2 wheels)  OT Visit Diagnosis: Unsteadiness on feet (R26.81);Repeated falls (R29.6);Muscle weakness (generalized) (M62.81)   Activity Tolerance Patient tolerated treatment well   Patient Left in bed;with call bell/phone within reach;with bed alarm set   Nurse Communication Mobility status;Other (comment) (found can of chewing tobacco in his bed)        Time: 9045-8973 OT Time Calculation (min): 32 min  Charges: OT General Charges $OT Visit: 1 Visit OT Treatments $Self Care/Home Management : 23-37 mins  Warren SAUNDERS., MPH, MS, OTR/L ascom [phone] 03/07/24, 10:42 AM

## 2024-03-08 DIAGNOSIS — I5020 Unspecified systolic (congestive) heart failure: Secondary | ICD-10-CM

## 2024-03-08 DIAGNOSIS — I5033 Acute on chronic diastolic (congestive) heart failure: Secondary | ICD-10-CM

## 2024-03-08 DIAGNOSIS — I5023 Acute on chronic systolic (congestive) heart failure: Secondary | ICD-10-CM | POA: Diagnosis not present

## 2024-03-08 DIAGNOSIS — I9589 Other hypotension: Secondary | ICD-10-CM | POA: Diagnosis not present

## 2024-03-08 DIAGNOSIS — I480 Paroxysmal atrial fibrillation: Secondary | ICD-10-CM | POA: Diagnosis not present

## 2024-03-08 DIAGNOSIS — I3139 Other pericardial effusion (noninflammatory): Secondary | ICD-10-CM | POA: Diagnosis not present

## 2024-03-08 LAB — BASIC METABOLIC PANEL WITH GFR
Anion gap: 6 (ref 5–15)
BUN: 20 mg/dL (ref 8–23)
CO2: 33 mmol/L — ABNORMAL HIGH (ref 22–32)
Calcium: 9.1 mg/dL (ref 8.9–10.3)
Chloride: 97 mmol/L — ABNORMAL LOW (ref 98–111)
Creatinine, Ser: 1.86 mg/dL — ABNORMAL HIGH (ref 0.61–1.24)
GFR, Estimated: 35 mL/min — ABNORMAL LOW (ref >60)
Glucose, Bld: 88 mg/dL (ref 70–99)
Potassium: 3.7 mmol/L (ref 3.5–5.1)
Sodium: 136 mmol/L (ref 135–145)

## 2024-03-08 MED ORDER — APIXABAN 2.5 MG PO TABS
2.5000 mg | ORAL_TABLET | Freq: Two times a day (BID) | ORAL | Status: DC
  Administered 2024-03-08 – 2024-03-12 (×9): 2.5 mg via ORAL
  Filled 2024-03-08 (×8): qty 1

## 2024-03-08 NOTE — Progress Notes (Signed)
 Progress Note   Patient: Cameron Foster. FMW:[id number] DOB: April 19, 1940 DOA: 02/29/2024     8 DOS: the patient was seen and examined on 03/08/2024   Brief hospital course: 83 y.o. male with medical history significant of CHF with EF 30 to 35%, stroke, depression with anxiety, hypertension, hyperlipidemia, prediabetes, CAD, CKD-3A, A-fib on Eliquis , right bundle blockade, IBS, obesity, alcohol use, who presents with anasarca and low blood pressure.  Was sent to ED from clinic.  Patient was admitted for acute on chronic heart failure.   11/19.  Patient feeling tired and hemoglobin drifted down to 7.3.  Patient agreeable to blood transfusion.  Limited with meds with heart failure secondary to hypotension. 11/20.  Hemoglobin up to 9.3 after transfusion.  Blood pressure did come up.  Cardiology restarted torsemide , Farxiga  and Toprol . 11/21.  Creatinine 1.86 today.  Assessment and Plan: * Acute on chronic systolic CHF (congestive heart failure) (HCC) Ejection fraction has improved from September.  EF now 60 to 65%.  Cardiology restarted torsemide , Farxiga  and Toprol  on 11/20.  Continue to watch blood pressure closely.  Pericardial effusion Pericardial effusion does not show any evidence of tamponade.  Hypotension Blood pressure went low yesterday afternoon after cardiology restarted medications.  Normocytic anemia Patient given 1 unit of packed red blood cells on 11/19.  Hemoglobin 9.3.  Paroxysmal atrial fibrillation [facility]) Cardiology restarted Eliquis  on 11/20.  Patient on amiodarone .  Cardiology restarted Toprol  on 11/20.  Hyperlipidemia LDL goal <70 Continue Lipitor  Acute renal failure superimposed on stage 3a chronic kidney disease (HCC) Today's creatinine 1.86.  Creatinine on presentation 2.37.    Depression with anxiety On Zoloft   Obesity (BMI 30-39.9) BMI up to 31.19.  Class I obesity  Fatigue Could be secondary to anemia since felt better today.  Patient did not wear  the BiPAP last night.  Asked nursing staff to get him a BiPAP tonight.  Borderline elevation of pCO2.        Subjective: Patient feels okay.  Awaiting rehab bed and will likely also need insurance authorization.  Admitted with anasarca and low blood pressure.  Physical Exam: Vitals:   03/08/24 0406 03/08/24 0438 03/08/24 0833 03/08/24 1207  BP:  119/75 (!) 129/93 123/76  Pulse:  73 80 72  Resp:  20 17 16   Temp:  98.2 F (36.8 C) 98.2 F (36.8 C) 98.2 F (36.8 C)  TempSrc:      SpO2:  92% 96% 94%  Weight: 95.8 kg     Height:       Physical Exam HENT:     Head: Normocephalic.  Eyes:     General: Lids are normal.     Conjunctiva/sclera: Conjunctivae normal.  Cardiovascular:     Rate and Rhythm: Normal rate and regular rhythm.     Heart sounds: S1 normal and S2 normal. Murmur heard.     Systolic murmur is present with a grade of 2/6.  Pulmonary:     Breath sounds: Examination of the right-lower field reveals decreased breath sounds. Examination of the left-lower field reveals decreased breath sounds. Decreased breath sounds present. No wheezing, rhonchi or rales.  Musculoskeletal:     Right lower leg: Swelling present.     Left lower leg: Swelling present.  Skin:    General: Skin is warm.     Findings: No rash.  Neurological:     Mental Status: He is alert.     Data Reviewed: Creatinine 1.86, CO2 33, last hemoglobin 9.3  Family  Communication: Spoke with family on the phone  Disposition: Status is: Inpatient Remains inpatient appropriate because: TOC looking into insurance authorization and rehab bed.  Medically stable.  Planned Discharge Destination: Rehab    Time spent: 28 minutes  Author: Charlie Patterson, MD 03/08/2024 1:29 PM  For on call review [email or web address].

## 2024-03-08 NOTE — Progress Notes (Signed)
 Cardiology Progress Note   Patient Name: Cameron Foster. Date of Encounter: 03/08/2024  Primary Cardiologist: Lonni Hanson, MD  Subjective   Feels well this morning.  Hoping for discharge but recommendation for rehab.  Awaiting placement.  Denies chest pain or dyspnea.  Objective   Inpatient Medications    Scheduled Meds:  sodium chloride    Intravenous Once   amiodarone   200 mg Oral Daily   apixaban   2.5 mg Oral BID   atorvastatin   40 mg Oral Daily   cholecalciferol   1,000 Units Oral Daily   cyanocobalamin   1,000 mcg Oral Daily   dapagliflozin  propanediol  10 mg Oral Daily   folic acid   1 mg Oral Daily   loratadine   10 mg Oral Daily   metoprolol  succinate  25 mg Oral Daily   pantoprazole   40 mg Oral Daily   potassium chloride   20 mEq Oral Daily   sertraline   100 mg Oral Daily   tamsulosin   0.4 mg Oral Daily   thiamine   100 mg Oral Daily   torsemide   40 mg Oral Daily   zinc  sulfate (50mg  elemental zinc )  220 mg Oral Daily   Continuous Infusions:  PRN Meds: acetaminophen , albuterol , dextromethorphan -guaiFENesin , fluticasone , melatonin, ondansetron  (ZOFRAN ) IV   Vital Signs    Vitals:   03/08/24 0406 03/08/24 0438 03/08/24 0833 03/08/24 1207  BP:  119/75 (!) 129/93 123/76  Pulse:  73 80 72  Resp:  20 17 16   Temp:  98.2 F (36.8 C) 98.2 F (36.8 C) 98.2 F (36.8 C)  TempSrc:      SpO2:  92% 96% 94%  Weight: 95.8 kg     Height:        Intake/Output Summary (Last 24 hours) at 03/08/2024 1257 Last data filed at 03/08/2024 0403 Gross per 24 hour  Intake 600 ml  Output 1225 ml  Net -625 ml   Filed Weights   03/06/24 0500 03/07/24 0500 03/08/24 0406  Weight: 91.8 kg 94.7 kg 95.8 kg    Physical Exam   GEN: Well nourished, well developed, in no acute distress.  HEENT: Grossly normal.  Neck: Supple, no JVD, carotid bruits, or masses. Cardiac: RRR, no murmurs, rubs, or gallops. No clubbing, cyanosis, edema.  Radials 2+, DP/PT 2+ and equal  bilaterally.  Respiratory:  Respirations regular and unlabored, clear to auscultation bilaterally. GI: Soft, nontender, nondistended, BS + x 4. MS: no deformity or atrophy. Skin: warm and dry, no rash. Neuro:  Strength and sensation are intact. Psych: AAOx3.  Normal affect.  Labs    Chemistry Recent Labs  Lab 03/02/24 0538 03/03/24 0514 03/06/24 0517 03/07/24 0425 03/08/24 0940  NA 136   < > 136 137 136  K 3.6   < > 3.6 3.8 3.7  CL 99   < > 96* 95* 97*  CO2 31   < > 31 32 33*  GLUCOSE 94   < > 93 97 88  BUN 22   < > 24* 20 20  CREATININE 2.02*   < > 2.11* 1.91* 1.86*  CALCIUM  8.8*   < > 9.0 9.2 9.1  PROT 6.3*  --   --   --   --   ALBUMIN  3.3*  --   --   --   --   AST 21  --   --   --   --   ALT 12  --   --   --   --   ALKPHOS 68  --   --   --   --  BILITOT 0.5  --   --   --   --   GFRNONAA 32*   < > 30* 34* 35*  ANIONGAP 6   < > 8 10 6    < > = values in this interval not displayed.     Hematology Recent Labs  Lab 03/02/24 0538 03/03/24 0514 03/05/24 0457 03/06/24 0517 03/07/24 0425  WBC 5.6  --  6.1 5.7  --   RBC 2.83*  2.81*  --  2.90* 2.87*  --   HGB 7.8*   < > 8.1* 7.9* 9.3*  HCT 24.6*  --  26.1* 25.8*  --   MCV 86.9  --  90.0 89.9  --   MCH 27.6  --  27.9 27.5  --   MCHC 31.7  --  31.0 30.6  --   RDW 14.9  --  15.0 14.8  --   PLT 228  --  249 247  --    < > = values in this interval not displayed.    BNP    Component Value Date/Time   BNP 523.8 (H) 01/01/2024 0105   BNP 117.3 (H) 08/12/2015 1632    ProBNP    Component Value Date/Time   PROBNP 1,620.0 (H) 03/05/2024 1025   Lipids  Lab Results  Component Value Date   CHOL 154 12/21/2023   HDL 53 12/21/2023   LDLCALC 68 12/21/2023   TRIG 167 (H) 12/21/2023   CHOLHDL 2.9 12/21/2023    HbA1c  Lab Results  Component Value Date   HGBA1C 6.0 (H) 02/01/2024    Radiology    DG Chest 2 View Result Date: 03/05/2024 CLINICAL DATA:  Shortness of breath. EXAM: CHEST - 2 VIEW COMPARISON:   02/29/2024. FINDINGS: Low lung volumes. Stable cardiomegaly. Aortic atherosclerosis. Bilateral diffuse hazy opacities, favoring pulmonary edema. No sizable pleural effusion or pneumothorax. Degenerative changes of the spine. No acute osseous abnormality. IMPRESSION: Cardiomegaly with bilateral diffuse hazy airspace opacities, favoring pulmonary edema. Electronically Signed   By: Harrietta Sherry M.D.   On: 03/05/2024 15:34     Telemetry    Sinus rhythm in the 70s- Personally Reviewed  Cardiac Studies   2D Echocardiogram 11.14.2025  1. Left ventricular ejection fraction, by estimation, is 60 to 65%. The  left ventricle has normal function. Left ventricular endocardial border  not optimally defined to evaluate regional wall motion. There is moderate  left ventricular hypertrophy. Left  ventricular diastolic parameters are indeterminate.   2. Right ventricular systolic function is normal. The right ventricular  size is normal. Tricuspid regurgitation signal is inadequate for assessing  PA pressure.   3. Moderate pericardial effusion. The pericardial effusion is  circumferential. No RV diastolic collapse visualized. Mitral and tricuspid  inflow not performed.   4. The inferior vena cava is dilated in size with <50% respiratory  variability, suggesting right atrial pressure of 15 mmHg.  _____________   2D Echocardiogram 11.16.2025  1. Left ventricular ejection fraction, by estimation, is 60 to 65%. The  left ventricle has normal function. The left ventricle has no regional  wall motion abnormalities. There is mild concentric left ventricular  hypertrophy.   2. Right ventricular systolic function is normal.   3. Pericardial effusion is unchanged in size from 03/01/24. There is  hyperechoic densities anterior to the right ventricle unchanged from prior  concerning for thrombus or fibrinous material. No evidence of tamponade.  Moderate pericardial effusion. The  pericardial effusion is  circumferential. There is no evidence of cardiac  tamponade.   4. The inferior vena cava is normal in size with greater than 50%  respiratory variability, suggesting right atrial pressure of 3 mmHg.  _____________   Patient Profile     83 y.o. male with a history of chronic HFpEF, paroxysmal atrial fibrillation on amiodarone /Eliquis , stage IIIa chronic kidney disease, anemia, stroke, subarachnoid hemorrhage, hypertension, hyperlipidemia, colitis with prior GI bleeding, prostate cancer, alcohol abuse, and tobacco abuse, who was admitted November 13 with acute on chronic heart failure.  Assessment & Plan    1.  Acute on chronic HFpEF: EF previously measured 30 to 35% with global hypokinesis in the setting of atrial fibrillation at the time of TEE in September 2025 with subsequent improvement by echo earlier this admission.  He was admitted November 13 with volume overload and initially responded well to IV diuresis with subsequent bump in creatinine prompting holding.  Oral torsemide  was added back November 20 with good response and stable renal function with creatinine of 1.86.  He is -825 mL yesterday and -6.5 L for admission.  Interestingly, weight is listed as up 4 kg since November 19.  Will ask for standing scale weight.  On examination, he appears euvolemic.  Heart rate and blood pressure stable, though pressure somewhat soft yesterday following resumption of torsemide .  Continue beta-blocker, Farxiga , and oral torsemide .  Will consider spironolactone but we will hold off and see how blood pressures trend on current regimen.  2.  Pericardial effusion: Moderate pericardial effusion without tamponade physiology noted on echo in November 14 and was stable on follow-up limited echo November 16. Sed rate is 63 with CRP of 6.1.  Poor candidate for colchicine and NSAIDs in the setting of acute on chronic kidney disease.  Could consider anti-IL-1 agent versus corticosteroids however, he is asymptomatic.   Eliquis  had been on hold with plan to resume today.  He will need follow-up limited echo within the next week.  3.  Persistent atrial fibrillation: Status post TEE and cardioversion September 2025 and he has since been maintained on amiodarone  200 mg daily.  He is maintaining sinus rhythm.  Eliquis  due to resume today.  Previously on 5 mg twice daily.  In the setting of age greater than 80 and creatinine greater than 1.5, we will reduce the dose to 2.5 mg twice daily.  Continue beta-blocker.  4.  Acute on chronic stage III kidney disease: Creatinine worsened with diuresis prompting initial holding of IV diuretic therapy.  As above, creatinine stable this morning on oral torsemide  at 1.86.  Follow-up.  5.  Normocytic anemia: He did receive 1 unit of packed red blood cells on November 19.  Hemoglobin 9.3 yesterday.  Will follow-up in the a.m. in the setting of resumption of Eliquis .  6.  Tobacco/alcohol abuse: Complete cessation advised.  7.  Obstructive sleep apnea: Uses CPAP at home.  8.  Hyperlipidemia: On Lipitor 40 with LDL of 68 previously.  Signed, Lonni Meager, NP  03/08/2024, 12:57 PM    For questions or updates, please contact   Please consult [email or web address] for contact info under Cardiology/STEMI.

## 2024-03-08 NOTE — Progress Notes (Signed)
   03/08/24 1630  Spiritual Encounters  Type of Visit Initial  Care provided to: Patient  Referral source Family  Reason for visit Routine spiritual support  OnCall Visit Yes  Spiritual Framework  Presenting Themes Values and beliefs;Rituals and practive  Community/Connection Family;Spiritual leader;Faith community  Strengths Patient is encouraged by his family, Juliene and faith community.  Needs/Challenges/Barriers Significant health concerns.  Patient Stress Factors Health changes;Loss  Family Stress Factors None identified  Interventions  Spiritual Care Interventions Made Prayer;Compassionate presence;Reflective listening  Intervention Outcomes  Outcomes Connected to spiritual community;Connection to spiritual care  Spiritual Care Plan  Spiritual Care Issues Still Outstanding No further spiritual care needs at this time (see row info)   Chaplain visited patient per the request of the family Chaplain met in the hallway.  Chaplain prayed with patient and offered a compassionate presence and prayer.    Rev. Rana M. Nicholaus, M.Div. Chaplain Resident [facility]

## 2024-03-09 ENCOUNTER — Inpatient Hospital Stay (HOSPITAL_COMMUNITY)
Admit: 2024-03-09 | Discharge: 2024-03-09 | Disposition: A | Discharge status: Home / Self Care | Attending: Nurse Practitioner | Admitting: Nurse Practitioner

## 2024-03-09 DIAGNOSIS — I5020 Unspecified systolic (congestive) heart failure: Secondary | ICD-10-CM | POA: Diagnosis not present

## 2024-03-09 DIAGNOSIS — N179 Acute kidney failure, unspecified: Secondary | ICD-10-CM

## 2024-03-09 DIAGNOSIS — I3139 Other pericardial effusion (noninflammatory): Secondary | ICD-10-CM

## 2024-03-09 DIAGNOSIS — I959 Hypotension, unspecified: Secondary | ICD-10-CM | POA: Diagnosis not present

## 2024-03-09 LAB — BASIC METABOLIC PANEL WITH GFR
Anion gap: 6 (ref 5–15)
BUN: 25 mg/dL — ABNORMAL HIGH (ref 8–23)
CO2: 32 mmol/L (ref 22–32)
Calcium: 9 mg/dL (ref 8.9–10.3)
Chloride: 97 mmol/L — ABNORMAL LOW (ref 98–111)
Creatinine, Ser: 1.98 mg/dL — ABNORMAL HIGH (ref 0.61–1.24)
GFR, Estimated: 33 mL/min — ABNORMAL LOW (ref >60)
Glucose, Bld: 98 mg/dL (ref 70–99)
Potassium: 3.9 mmol/L (ref 3.5–5.1)
Sodium: 135 mmol/L (ref 135–145)

## 2024-03-09 LAB — ECHOCARDIOGRAM LIMITED
Height: 69 in
S' Lateral: 3.2 cm
Weight: 3231.06 [oz_av]

## 2024-03-09 LAB — HEMOGLOBIN: Hemoglobin: 8.8 g/dL — ABNORMAL LOW (ref 13.0–17.0)

## 2024-03-09 MED ORDER — PREDNISONE 20 MG PO TABS
20.0000 mg | ORAL_TABLET | Freq: Every day | ORAL | Status: DC
  Administered 2024-03-09 – 2024-03-12 (×4): 20 mg via ORAL
  Filled 2024-03-09 (×4): qty 1

## 2024-03-09 NOTE — Progress Notes (Signed)
 Progress Note   Patient: Cameron Foster. FMW:[id number] DOB: 04-29-1940 DOA: 02/29/2024     9 DOS: the patient was seen and examined on 03/09/2024   Brief hospital course: 83 y.o. male with medical history significant of CHF with EF 30 to 35%, stroke, depression with anxiety, hypertension, hyperlipidemia, prediabetes, CAD, CKD-3A, A-fib on Eliquis , right bundle blockade, IBS, obesity, alcohol use, who presents with anasarca and low blood pressure.  Was sent to ED from clinic.  Patient was admitted for acute on chronic heart failure.   11/19.  Patient feeling tired and hemoglobin drifted down to 7.3.  Patient agreeable to blood transfusion.  Limited with meds with heart failure secondary to hypotension. 11/20.  Hemoglobin up to 9.3 after transfusion.  Blood pressure did come up.  Cardiology restarted torsemide , Farxiga  and Toprol . 11/21.  Creatinine 1.86 today. 11/22.  Creatinine 1.98.  Repeat echocardiogram does not show any evidence of tamponade and EF 55 to 60%.  Weight down to 201.94 today.  Assessment and Plan: * Acute on chronic systolic CHF (congestive heart failure) (HCC) Ejection fraction has improved from September.  EF now 55 to 60% (heart failure with recovered ejection fraction).  Cardiology restarted torsemide , Farxiga  and Toprol  on 11/20.  Weight down to 201.94 pounds.  Pericardial effusion Pericardial effusion does not show any evidence of tamponade on echocardiogram today.  Normal EF.  Cardiology recommended prednisone  20 mg daily for 2 weeks then tapering dose after that.  Hypotension Blood pressure holding with current medications.  Acute kidney injury superimposed on CKD Looks like chronic kidney disease is going to be stage IIIb.  Today's creatinine 1.98 with a GFR of 33.  Creatinine peak at 2.37 on presentation.  Lowest creatinine 1.86.  Normocytic anemia Patient given 1 unit of packed red blood cells on 11/19.  Hemoglobin 8.8.  Paroxysmal atrial fibrillation  [facility]) Cardiology restarted Eliquis  on 11/20.  Patient on amiodarone .  Cardiology restarted Toprol  on 11/20.  Hyperlipidemia LDL goal <70 Continue Lipitor  Depression with anxiety On Zoloft   Obesity (BMI 30-39.9) With diuresis BMI actually went down to 29.82.  (Previously class I obesity)  Fatigue Could be secondary to anemia since felt better after transfusion.  Patient wore the BiPAP for borderline elevated pCO2.        Subjective: Patient feels okay.  Breathing better.  Has felt better since he had a transfusion.  Admitted with anasarca and hypotension.  Physical Exam: Vitals:   03/09/24 0334 03/09/24 0529 03/09/24 0832 03/09/24 1138  BP: 106/71  131/83 124/79  Pulse: 68  71 74  Resp: 18   18  Temp: 98.1 F (36.7 C)  98 F (36.7 C) 98.2 F (36.8 C)  TempSrc: Oral     SpO2: 98%  95% 94%  Weight:  91.6 kg    Height:       Physical Exam HENT:     Head: Normocephalic.  Eyes:     General: Lids are normal.     Conjunctiva/sclera: Conjunctivae normal.  Cardiovascular:     Rate and Rhythm: Normal rate and regular rhythm.     Heart sounds: S1 normal and S2 normal. Murmur heard.     Systolic murmur is present with a grade of 2/6.  Pulmonary:     Breath sounds: Examination of the right-lower field reveals decreased breath sounds. Examination of the left-lower field reveals decreased breath sounds. Decreased breath sounds present. No wheezing, rhonchi or rales.  Musculoskeletal:     Right lower leg: Swelling  present.     Left lower leg: Swelling present.  Skin:    General: Skin is warm.     Findings: No rash.  Neurological:     Mental Status: He is alert.     Data Reviewed: Repeat echocardiogram shows an EF of 55 to 60% no signs of cardiac tamponade with moderate pericardial effusion.  Family Communication: Daughter at bedside and sister in the hallway  Disposition: Status is: Inpatient Remains inpatient appropriate because: The patient told me his first choice  is [facility] and I left our transitional care team know.  Still need insurance authorization.  Planned Discharge Destination: Rehab    Time spent: 28 minutes  Author: Charlie Patterson, MD 03/09/2024 2:21 PM  For on call review [email or web address].

## 2024-03-09 NOTE — Plan of Care (Signed)

## 2024-03-09 NOTE — Progress Notes (Signed)
  Echocardiogram 2D Echocardiogram has been performed. A Limited Echo was requested and completed at this time.  Cameron Foster 03/09/2024, 9:54 AM

## 2024-03-09 NOTE — Assessment & Plan Note (Signed)
 Looks like chronic kidney disease is going to be stage IIIb.  Today's creatinine 1.98 with a GFR of 33.  Creatinine peak at 2.37 on presentation.  Lowest creatinine 1.86.

## 2024-03-09 NOTE — Progress Notes (Signed)
 Mobility Specialist Progress Note:    03/09/24 1639  Mobility  Activity Ambulated with assistance  Level of Assistance Contact guard assist, steadying assist  Assistive Device Front wheel walker  Distance Ambulated (ft) 130 ft  Range of Motion/Exercises Active;All extremities  Activity Response Tolerated well  Mobility visit 1 Mobility  Mobility Specialist Start Time (ACUTE ONLY) 1622  Mobility Specialist Stop Time (ACUTE ONLY) 1638  Mobility Specialist Time Calculation (min) (ACUTE ONLY) 16 min   Pt received sitting EOB, agreeable to mobility. Required CGA to stand and ambulate with RW. Tolerated well, see BP and HR pre and post mobility below. Pt required x4 rest breaks throughout session for fatigue. C/o dizziness at beginning of session that subsided quickly. Returned pt to room, left in fowlers with alarm on and belongings in reach. RN notified, all needs met.  Pre-mobility: BP: 126/84 mmHg, HR 76 bpm Post-mobility: BP: 91/71 mmHg, HR 87 bpm SpO2 95% on RA throughout  Sherrilee Ditty Mobility Specialist Please contact via SecureChat or  Rehab office at [phone]

## 2024-03-09 NOTE — TOC Progression Note (Addendum)
 Transition of Care (TOC) - Progression Note    Patient Details  Name: Cameron Foster. MRN: [id number] Date of Birth: [birth year]  [facility]) CM/SW Contact  Timeka Goette E Vanellope Passmore, LCSW Phone Number: 03/09/2024, 11:18 AM  Clinical Narrative:    Patient told MD he chose [employer] [employer]. LVM for Therisa and Brittany with [employer], awaiting responses.    Expected Discharge Plan: Home w Home Health Services Barriers to Discharge: Continued Medical Work up               Expected Discharge Plan and Services       Living arrangements for the past 2 months: Single Family Home                                       Social Drivers of Health (SDOH) Interventions SDOH Screenings   Food Insecurity: No Food Insecurity (03/01/2024)  Housing: Low Risk  (03/01/2024)  Transportation Needs: No Transportation Needs (03/01/2024)  Utilities: Not At Risk (03/01/2024)  Alcohol Screen: Medium Risk (01/28/2024)  Depression (PHQ2-9): High Risk (02/05/2024)  Financial Resource Strain: Low Risk  (01/28/2024)  Physical Activity: Inactive (01/28/2024)  Social Connections: Moderately Integrated (03/01/2024)  Stress: No Stress Concern Present (01/28/2024)  Recent Concern: Stress - Stress Concern Present (11/24/2023)  Tobacco Use: High Risk (02/29/2024)  Health Literacy: Adequate Health Literacy (05/31/2023)    Readmission Risk Interventions     No data to display

## 2024-03-10 DIAGNOSIS — I5023 Acute on chronic systolic (congestive) heart failure: Secondary | ICD-10-CM | POA: Diagnosis not present

## 2024-03-10 DIAGNOSIS — N179 Acute kidney failure, unspecified: Secondary | ICD-10-CM | POA: Diagnosis not present

## 2024-03-10 DIAGNOSIS — I9589 Other hypotension: Secondary | ICD-10-CM | POA: Diagnosis not present

## 2024-03-10 DIAGNOSIS — I3139 Other pericardial effusion (noninflammatory): Secondary | ICD-10-CM | POA: Diagnosis not present

## 2024-03-10 LAB — CBC
HCT: 28.2 % — ABNORMAL LOW (ref 39.0–52.0)
Hemoglobin: 8.6 g/dL — ABNORMAL LOW (ref 13.0–17.0)
MCH: 27.1 pg (ref 26.0–34.0)
MCHC: 30.5 g/dL (ref 30.0–36.0)
MCV: 89 fL (ref 80.0–100.0)
Platelets: 245 K/uL (ref 150–400)
RBC: 3.17 MIL/uL — ABNORMAL LOW (ref 4.22–5.81)
RDW: 14.6 % (ref 11.5–15.5)
WBC: 6.1 K/uL (ref 4.0–10.5)
nRBC: 0 % (ref 0.0–0.2)

## 2024-03-10 MED ORDER — KETOCONAZOLE 2 % EX CREA
TOPICAL_CREAM | Freq: Two times a day (BID) | CUTANEOUS | Status: DC
  Administered 2024-03-11: 1 via TOPICAL
  Filled 2024-03-10: qty 15

## 2024-03-10 NOTE — Progress Notes (Signed)
 Progress Note   Patient: Cameron Foster. FMW:[id number] DOB: 05/03/1940 DOA: 02/29/2024     10 DOS: the patient was seen and examined on 03/10/2024   Brief hospital course: 83 y.o. male with medical history significant of CHF with EF 30 to 35%, stroke, depression with anxiety, hypertension, hyperlipidemia, prediabetes, CAD, CKD-3A, A-fib on Eliquis , right bundle blockade, IBS, obesity, alcohol use, who presents with anasarca and low blood pressure.  Was sent to ED from clinic.  Patient was admitted for acute on chronic heart failure.   11/19.  Patient feeling tired and hemoglobin drifted down to 7.3.  Patient agreeable to blood transfusion.  Limited with meds with heart failure secondary to hypotension. 11/20.  Hemoglobin up to 9.3 after transfusion.  Blood pressure did come up.  Cardiology restarted torsemide , Farxiga  and Toprol . 11/21.  Creatinine 1.86 today. 11/22.  Creatinine 1.98.  Repeat echocardiogram does not show any evidence of tamponade and EF 55 to 60%.  Weight down to 201.94 today. 11/23.  Hemoglobin 8.6.  Nursing staff weight on standing scale to get a more accurate weight.  Assessment and Plan: * Acute on chronic systolic CHF (congestive heart failure) (HCC) Ejection fraction has improved from September.  EF now 55 to 60% (heart failure with recovered ejection fraction).  Cardiology restarted torsemide , Farxiga  and Toprol  on 11/20.  Asking nursing staff to get weight on a standing scale.  No ACE/ARB secondary to fluctuating renal function and hypotension.  Pericardial effusion Pericardial effusion does not show any evidence of tamponade on echocardiogram today.  Normal EF.  Cardiology recommended prednisone  20 mg daily for 2 weeks then tapering dose after that.  Hypotension Blood pressure holding with current medications.  Acute kidney injury superimposed on CKD Looks like chronic kidney disease is going to be stage IIIb.  Last creatinine 1.98 with a GFR of 33.  Creatinine  peak at 2.37 on presentation.  Lowest creatinine 1.86.  Normocytic anemia Patient given 1 unit of packed red blood cells on 11/19.  Hemoglobin 8.6.  Paroxysmal atrial fibrillation [facility]) Cardiology restarted Eliquis  on 11/20.  Patient on amiodarone .  Cardiology restarted Toprol  on 11/20.  Hyperlipidemia LDL goal <70 Continue Lipitor  Depression with anxiety On Zoloft   Obesity (BMI 30-39.9) With diuresis BMI actually went down to 29.82.  (Previously class I obesity)  Fatigue Could be secondary to anemia since felt better after transfusion.  Patient wore the BiPAP for borderline elevated pCO2.        Subjective: Patient states he feels better.  Improved after blood transfusion.  Still has some swelling of the lower extremities.  On oral medications.  Physical Exam: Vitals:   03/10/24 0435 03/10/24 0500 03/10/24 0838 03/10/24 1142  BP: (!) 130/93  134/84 110/70  Pulse: 67  76 67  Resp: 20     Temp: 98.3 F (36.8 C)  98.1 F (36.7 C) 98.8 F (37.1 C)  TempSrc:      SpO2: 97%  97% 97%  Weight:  96.1 kg    Height:       Physical Exam HENT:     Head: Normocephalic.  Eyes:     General: Lids are normal.     Conjunctiva/sclera: Conjunctivae normal.  Cardiovascular:     Rate and Rhythm: Normal rate and regular rhythm.     Heart sounds: S1 normal and S2 normal. Murmur heard.     Systolic murmur is present with a grade of 2/6.  Pulmonary:     Breath sounds: Examination of  the right-lower field reveals decreased breath sounds. Examination of the left-lower field reveals decreased breath sounds. Decreased breath sounds present. No wheezing, rhonchi or rales.  Musculoskeletal:     Right lower leg: Swelling present.     Left lower leg: Swelling present.  Skin:    General: Skin is warm.     Findings: No rash.  Neurological:     Mental Status: He is alert.     Data Reviewed: Last creatinine 1.98, hemoglobin 8.6  Family Communication: Spoke with sister on the  phone  Disposition: Status is: Inpatient Remains inpatient appropriate because: TOC working on rehab beds and will need insurance authorization.  Watching hemoglobin.  Medically stable.  Planned Discharge Destination: Rehab    Time spent: 28 minutes  Author: Charlie Patterson, MD 03/10/2024 1:36 PM  For on call review [email or web address].

## 2024-03-10 NOTE — Progress Notes (Signed)
 Mobility Specialist Progress Note:    03/10/24 0940  Mobility  Activity Stood at bedside;Pivoted/transferred from bed to chair  Level of Assistance Contact guard assist, steadying assist  Assistive Device Front wheel walker  Distance Ambulated (ft) 4 ft  Range of Motion/Exercises Active;All extremities  Activity Response Tolerated well  Mobility visit 1 Mobility  Mobility Specialist Start Time (ACUTE ONLY) [id number]  Mobility Specialist Stop Time (ACUTE ONLY) 0940  Mobility Specialist Time Calculation (min) (ACUTE ONLY) 11 min   Pt received in bed, agreeable to mobility. Required CGA to stand and transfer with RW. Tolerated well, pt prefers to reach highest level of mobility after lunch time. Pt c/o dizziness upon standing that subsided when seated. Left in chair, alarm on and belongings in reach. RN aware, all needs met.  Sherrilee Ditty Mobility Specialist Please contact via Special Educational Needs Teacher or  Rehab office at [phone]

## 2024-03-10 NOTE — Progress Notes (Signed)
 Mobility Specialist Progress Note:    03/10/24 1510  Mobility  Activity Stood at bedside;Pivoted/transferred to/from [facility];Ambulated with assistance  Level of Assistance Contact guard assist, steadying assist  Assistive Device Front wheel walker  Distance Ambulated (ft) 5 ft  Range of Motion/Exercises Active;All extremities  Activity Response Tolerated well  Mobility visit 1 Mobility  Mobility Specialist Start Time (ACUTE ONLY) 1455  Mobility Specialist Stop Time (ACUTE ONLY) 1531  Mobility Specialist Time Calculation (min) (ACUTE ONLY) 36 min   Pt received in chair with sister at bedside, agreeable to mobility. Pre mobility blood pressure 78/53 mmHg. Second attempt: 84/53 mmHg. RN approved for pt to transfer to [facility] for BM at this time, BP 80/46 mmHg upon standing. Pt c/o feeling lightheaded, able to transfer to [facility]. BP taken after BM and peri care, 80/53 mmHg. Returned pt to chair, alarm on and belongings in reach. Care team notified, all needs met.  Sherrilee Ditty Mobility Specialist Please contact via Special Educational Needs Teacher or  Rehab office at [phone]

## 2024-03-10 NOTE — Plan of Care (Signed)

## 2024-03-11 ENCOUNTER — Telehealth: Payer: Self-pay

## 2024-03-11 ENCOUNTER — Ambulatory Visit: Admitting: Family

## 2024-03-11 DIAGNOSIS — I9589 Other hypotension: Secondary | ICD-10-CM | POA: Diagnosis not present

## 2024-03-11 DIAGNOSIS — I3139 Other pericardial effusion (noninflammatory): Secondary | ICD-10-CM | POA: Diagnosis not present

## 2024-03-11 DIAGNOSIS — I5023 Acute on chronic systolic (congestive) heart failure: Secondary | ICD-10-CM | POA: Diagnosis not present

## 2024-03-11 DIAGNOSIS — N179 Acute kidney failure, unspecified: Secondary | ICD-10-CM | POA: Diagnosis not present

## 2024-03-11 LAB — BASIC METABOLIC PANEL WITH GFR
Anion gap: 7 (ref 5–15)
BUN: 30 mg/dL — ABNORMAL HIGH (ref 8–23)
CO2: 30 mmol/L (ref 22–32)
Calcium: 9.1 mg/dL (ref 8.9–10.3)
Chloride: 98 mmol/L (ref 98–111)
Creatinine, Ser: 2.02 mg/dL — ABNORMAL HIGH (ref 0.61–1.24)
GFR, Estimated: 32 mL/min — ABNORMAL LOW (ref >60)
Glucose, Bld: 100 mg/dL — ABNORMAL HIGH (ref 70–99)
Potassium: 4 mmol/L (ref 3.5–5.1)
Sodium: 135 mmol/L (ref 135–145)

## 2024-03-11 LAB — TYPE AND SCREEN
ABO/RH(D): O POS
Antibody Screen: NEGATIVE

## 2024-03-11 LAB — HEMOGLOBIN: Hemoglobin: 8.4 g/dL — ABNORMAL LOW (ref 13.0–17.0)

## 2024-03-11 MED ORDER — METOPROLOL SUCCINATE ER 25 MG PO TB24
12.5000 mg | ORAL_TABLET | Freq: Every day | ORAL | Status: DC
  Filled 2024-03-11: qty 1

## 2024-03-11 MED ORDER — IRON SUCROSE 300 MG IVPB - SIMPLE MED
300.0000 mg | Freq: Once | Status: AC
  Administered 2024-03-11: 300 mg via INTRAVENOUS
  Filled 2024-03-11: qty 300

## 2024-03-11 MED ORDER — ONDANSETRON HCL 4 MG/2ML IJ SOLN
4.0000 mg | Freq: Once | INTRAMUSCULAR | Status: DC
  Filled 2024-03-11: qty 2

## 2024-03-11 NOTE — TOC Progression Note (Addendum)
 Transition of Care (TOC) - Progression Note    Patient Details  Name: Cameron Foster. MRN: [id number] Date of Birth: 04-23-1940  Transition of Care [facility]) CM/SW Contact  Lauraine JAYSON Carpen, LCSW Phone Number: 03/11/2024, 11:02 AM  Clinical Narrative:  [facility] is at capacity and cannot offer a bed at this time. Patient is aware and has accepted the bed offer from [facility]. Admissions coordinator is aware and said she started insurance authorization on Friday. She is hoping to have an answer today.    12:21 pm: [facility] faxed auth approval details: [id number]. Valid 11/24-11/29. Emailed a copy to the [employer].  12:51 pm: CSW updated patient and sister. Sister will transport at discharge. SNF has a bed today if he's stable.  2:29 pm: Received call from sister. She does not want patient to go to [facility]. She would like to see if facilities in [facility] can offer a bed. [facility] and [facility] accept [facility] and will review. Left messages for [facility] and [facility] admissions coordinators. Also sent referral to [facility].  3:32 pm: [facility], [place], and [place] are unable to offer. [facility] has offered. Sister is considering bringing patient home with her. She has someone coming that assists with setting up services in the home. Patient was active with [facility] prior to admission. They confirmed they service the [place] area as well.  Expected Discharge Plan: Home w Home Health Services Barriers to Discharge: Continued Medical Work up               Expected Discharge Plan and Services       Living arrangements for the past 2 months: Single Family Home                                       Social Drivers of Health (SDOH) Interventions SDOH Screenings   Food Insecurity: No Food Insecurity (03/01/2024)   Housing: Low Risk  (03/01/2024)  Transportation Needs: No Transportation Needs (03/01/2024)  Utilities: Not At Risk (03/01/2024)  Alcohol Screen: Medium Risk (01/28/2024)  Depression (PHQ2-9): High Risk (02/05/2024)  Financial Resource Strain: Low Risk  (01/28/2024)  Physical Activity: Inactive (01/28/2024)  Social Connections: Moderately Integrated (03/01/2024)  Stress: No Stress Concern Present (01/28/2024)  Recent Concern: Stress - Stress Concern Present (11/24/2023)  Tobacco Use: High Risk (02/29/2024)  Health Literacy: Adequate Health Literacy (05/31/2023)    Readmission Risk Interventions     No data to display

## 2024-03-11 NOTE — Progress Notes (Signed)
 Occupational Therapy Treatment Patient Details Name: Cameron Foster. MRN: [id number] DOB: [birth year] Today's Date: 03/11/2024   History of present illness Pt is an 83 y/o M admitted on 02/29/24 after presenting with c/o worsening LE edema, abdominal swelling, referred from cardiology office with anasarca & low BP. Pt is being treated for acute on chronic systolic CHF. PMH: CHF, stroke, depression, anxiety, HTN, HLD, prediabetes, CAD, CKD3A, a-fib on Eliquis , R bundle blockade, IBS, obesity, alcohol use, SAH, prostate CA, colitis   OT comments  Pt seen for OT treatment this date. Orthostatic vitals assessed with BP dropping to 77/47 MAP 56 seated at EOB. Pt asymptomatic except for mild endorsement of feeling woozy, per RN and patient advocate pt has been up walking to/from bathroom today with assist. Pt performs bed mobility with supervision, SPT to recliner with CGA. Edu on activity pacing strategies to assist with energy conservation. OT will follow acutely, discharge recommendation appropriate. Patient will benefit from continued inpatient follow up therapy, <3 hours/day.  Orthostatic VS for the past 24 hrs (Last 3 readings):  BP- Lying Pulse- Lying BP- Sitting Pulse- Sitting  03/11/24 1100 (!) 88/57 71 (!) 77/47 73         If plan is discharge home, recommend the following:  A little help with walking and/or transfers;A little help with bathing/dressing/bathroom;Assistance with cooking/housework;Assist for transportation;Help with stairs or ramp for entrance   Equipment Recommendations  BSC/3in1       Precautions / Restrictions Precautions Precautions: Fall Recall of Precautions/Restrictions: Intact Precaution/Restrictions Comments: watch orthostatics Restrictions Weight Bearing Restrictions Per Provider Order: No       Mobility Bed Mobility Overal bed mobility: Needs Assistance Bed Mobility: Supine to Sit     Supine to sit: HOB elevated, Used rails, Contact guard      General bed mobility comments: CGA-supervision    Transfers Overall transfer level: Needs assistance Equipment used: None Transfers: Bed to chair/wheelchair/BSC, Sit to/from Stand Sit to Stand: Contact guard assist     Step pivot transfers: Contact guard assist           Balance Overall balance assessment: Needs assistance Sitting-balance support: Feet supported Sitting balance-Leahy Scale: Good Sitting balance - Comments: pt reporting mild woozy feeling while sitting EOB, see orthostatics for BP   Standing balance support: During functional activity, No upper extremity supported Standing balance-Leahy Scale: Fair Standing balance comment: SPT to recliner (pt mildly spontaneous, and standing to pivot without RW to recliner)                           ADL either performed or assessed with clinical judgement   ADL Overall ADL's : Needs assistance/impaired                                     Functional mobility during ADLs: Minimal assistance       Communication Communication Communication: No apparent difficulties   Cognition Arousal: Alert Behavior During Therapy: WFL for tasks assessed/performed Cognition: No apparent impairments                               Following commands: Intact        Cueing   Cueing Techniques: Verbal cues  Exercises      Shoulder Instructions       General Comments Orthostatic  BP assessed with BP 77/47 (56) sitting, pt largely asymptomatic    Pertinent Vitals/ Pain       Pain Assessment Pain Assessment: No/denies pain   Frequency  Min 2X/week        Progress Toward Goals  OT Goals(current goals can now be found in the care plan section)  Progress towards OT goals: Progressing toward goals  Acute Rehab OT Goals OT Goal Formulation: With patient Time For Goal Achievement: 03/17/24 Potential to Achieve Goals: Fair ADL Goals Pt Will Perform Grooming: with modified  independence Pt Will Perform Lower Body Dressing: with contact guard assist;sit to/from stand Pt Will Transfer to Toilet: with contact guard assist;ambulating Pt Will Perform Toileting - Clothing Manipulation and hygiene: with contact guard assist;sit to/from stand   AM-PAC OT 6 Clicks Daily Activity     Outcome Measure   Help from another person eating meals?: None Help from another person taking care of personal grooming?: A Little Help from another person toileting, which includes using toliet, bedpan, or urinal?: A Lot Help from another person bathing (including washing, rinsing, drying)?: A Lot Help from another person to put on and taking off regular upper body clothing?: A Little Help from another person to put on and taking off regular lower body clothing?: A Lot 6 Click Score: 16    End of Session    OT Visit Diagnosis: Unsteadiness on feet (R26.81);Repeated falls (R29.6);Muscle weakness (generalized) (M62.81)   Activity Tolerance Patient tolerated treatment well   Patient Left in chair;with call bell/phone within reach;with nursing/sitter in room;with chair alarm set   Nurse Communication Mobility status (BP assessed and in flowsheet)        Time: 8887-8870 OT Time Calculation (min): 17 min  Charges: OT General Charges $OT Visit: 1 Visit OT Treatments $Therapeutic Activity: 8-22 mins  Furman Trentman L. Kaysen Sefcik, OTR/L  03/11/24, 2:10 PM

## 2024-03-11 NOTE — Progress Notes (Unsigned)
 Complex Care Management Care Guide Note  03/11/2024 Name: Cameron Foster. MRN: [id number] DOB: 11/02/1940  Cameron Foster. is a 83 y.o. year old male who is a primary care patient of Myrla, Jon [name], MD and is actively engaged with the care management team. I reached out to [employer]. by phone today to assist with re-scheduling  with the RN Case Manager.  Follow up plan: Unsuccessful telephone outreach attempt made. A HIPAA compliant phone message was left for the patient providing contact information and requesting a return call.  Leotis Rase [facility], [facility] Guide  Direct Dial: [phone]  Fax [phone]

## 2024-03-11 NOTE — Progress Notes (Addendum)
 Progress Note   Patient: Cameron Foster. FMW:[id number] DOB: 09-06-1940 DOA: 02/29/2024     11 DOS: the patient was seen and examined on 03/11/2024   Brief hospital course: 83 y.o. male with medical history significant of CHF with EF 30 to 35%, stroke, depression with anxiety, hypertension, hyperlipidemia, prediabetes, CAD, CKD-3A, A-fib on Eliquis , right bundle blockade, IBS, obesity, alcohol use, who presents with anasarca and low blood pressure.  Was sent to ED from clinic.  Patient was admitted for acute on chronic heart failure.   11/19.  Patient feeling tired and hemoglobin drifted down to 7.3.  Patient agreeable to blood transfusion.  Limited with meds with heart failure secondary to hypotension. 11/20.  Hemoglobin up to 9.3 after transfusion.  Blood pressure did come up.  Cardiology restarted torsemide , Farxiga  and Toprol . 11/21.  Creatinine 1.86 today. 11/22.  Creatinine 1.98.  Repeat echocardiogram does not show any evidence of tamponade and EF 55 to 60%.  Weight down to 201.94 today. 11/23.  Hemoglobin 8.6.  Nursing staff weight on standing scale to get a more accurate weight. 11/24.  Hemoglobin 8.4.  Patient with slight orthostasis but was asymptomatic and able to ambulate.  IV iron  given today.  Assessment and Plan: * Acute on chronic systolic CHF (congestive heart failure) (HCC) Ejection fraction has improved from September.  EF now 55 to 60% (heart failure with recovered ejection fraction).  Cardiology restarted torsemide , Farxiga  and Toprol  on 11/20.  No ACE/ARB secondary to fluctuating renal function and hypotension.  Will lower the dose of Toprol  to 12.5 mg daily for tomorrow.  Patient has slight orthostasis but asymptomatic.  Pericardial effusion Pericardial effusion does not show any evidence of tamponade on echocardiogram today.  Normal EF.  Cardiology recommended prednisone  20 mg daily for 2 weeks then tapering dose after that.  Hypotension Slightly orthostatic but  asymptomatic.  Will decrease Toprol  dose for tomorrow.  Acute kidney injury superimposed on CKD Looks like chronic kidney disease is going to be stage IIIb.  Last creatinine 2.02 with a GFR of 32.  Creatinine peak at 2.37 on presentation.  Lowest creatinine 1.86.  Normocytic anemia Patient given 1 unit of packed red blood cells on 11/19.  Hemoglobin 8.4.  Gave IV iron  today.  Paroxysmal atrial fibrillation [facility]) Cardiology restarted Eliquis  on 11/20.  Patient on amiodarone .  Cardiology restarted Toprol  on 11/20.  Hyperlipidemia LDL goal <70 Continue Lipitor  Depression with anxiety On Zoloft   Obesity (BMI 30-39.9) Weight to go up and down.  Today's BMI 31.04.  Fatigue Could be secondary to anemia since felt better after transfusion.  Patient wore the BiPAP for borderline elevated pCO2.        Subjective: Patient initially told me he did feels well this morning.  This afternoon was feeling okay.  Slight orthostasis but not symptomatic.  Physical Exam: Vitals:   03/11/24 0500 03/11/24 0749 03/11/24 0854 03/11/24 1244  BP:  117/77 (!) 142/64 96/60  Pulse:  69 (!) 58 67  Resp:  16 18 16   Temp:  98.2 F (36.8 C) 98 F (36.7 C) 98 F (36.7 C)  TempSrc:      SpO2:  97% 100% 95%  Weight: 95.4 kg     Height:       Physical Exam HENT:     Head: Normocephalic.  Eyes:     General: Lids are normal.     Conjunctiva/sclera: Conjunctivae normal.  Cardiovascular:     Rate and Rhythm: Normal rate and regular  rhythm.     Heart sounds: S1 normal and S2 normal. Murmur heard.     Systolic murmur is present with a grade of 2/6.  Pulmonary:     Breath sounds: No decreased breath sounds, wheezing, rhonchi or rales.  Musculoskeletal:     Right lower leg: Swelling present.     Left lower leg: Swelling present.  Skin:    General: Skin is warm.     Findings: No rash.  Neurological:     Mental Status: He is alert.     Data Reviewed: Creatinine 2.02, hemoglobin 8.4  Family  Communication: Sister at bedside  Disposition: Status is: Inpatient Remains inpatient appropriate because: Patient first choice on rehab does not have a bed.  Patient and family declined another rehab bed offer.  TOC expanding search.  They may need decided to bring him home with home health.  Planned Discharge Destination: Rehab, another option is home with home health    Time spent: 35 minutes  Author: Charlie Patterson, MD 03/11/2024 3:51 PM  For on call review [email or web address].

## 2024-03-11 NOTE — Progress Notes (Signed)
 Mobility Specialist Progress Note:    03/11/24 1747  Mobility  Activity Ambulated with assistance  Level of Assistance Contact guard assist, steadying assist  Assistive Device Front wheel walker  Distance Ambulated (ft) 160 ft  Range of Motion/Exercises Active;All extremities  Activity Response Tolerated well  Mobility visit 1 Mobility  Mobility Specialist Start Time (ACUTE ONLY) 1727  Mobility Specialist Stop Time (ACUTE ONLY) 1746  Mobility Specialist Time Calculation (min) (ACUTE ONLY) 19 min   Pt received in chair, agreeable to mobility. Required CGA to stand and ambulate with RW. Tolerated well, BP 137/83 mmHg prior to mobility and 110/75 mmHg after ambulation. Pt c/o feeling woozy and foggy upon standing, symptoms subsided. Left pt in chair, alarm on and belongings in reach. All needs met.  Sherrilee Ditty Mobility Specialist Please contact via Special Educational Needs Teacher or  Rehab office at [phone]

## 2024-03-12 ENCOUNTER — Other Ambulatory Visit: Payer: Self-pay

## 2024-03-12 DIAGNOSIS — I5020 Unspecified systolic (congestive) heart failure: Secondary | ICD-10-CM | POA: Diagnosis not present

## 2024-03-12 DIAGNOSIS — I3139 Other pericardial effusion (noninflammatory): Secondary | ICD-10-CM | POA: Diagnosis not present

## 2024-03-12 DIAGNOSIS — N179 Acute kidney failure, unspecified: Secondary | ICD-10-CM | POA: Diagnosis not present

## 2024-03-12 DIAGNOSIS — I9589 Other hypotension: Secondary | ICD-10-CM | POA: Diagnosis not present

## 2024-03-12 LAB — BASIC METABOLIC PANEL WITH GFR
Anion gap: 7 (ref 5–15)
BUN: 32 mg/dL — ABNORMAL HIGH (ref 8–23)
CO2: 29 mmol/L (ref 22–32)
Calcium: 8.9 mg/dL (ref 8.9–10.3)
Chloride: 98 mmol/L (ref 98–111)
Creatinine, Ser: 1.92 mg/dL — ABNORMAL HIGH (ref 0.61–1.24)
GFR, Estimated: 34 mL/min — ABNORMAL LOW (ref >60)
Glucose, Bld: 90 mg/dL (ref 70–99)
Potassium: 3.9 mmol/L (ref 3.5–5.1)
Sodium: 135 mmol/L (ref 135–145)

## 2024-03-12 LAB — HEMOGLOBIN: Hemoglobin: 8.4 g/dL — ABNORMAL LOW (ref 13.0–17.0)

## 2024-03-12 MED ORDER — METOPROLOL SUCCINATE ER 25 MG PO TB24
12.5000 mg | ORAL_TABLET | Freq: Every day | ORAL | 0 refills | Status: DC
  Filled 2024-03-12: qty 15, 30d supply, fill #0

## 2024-03-12 MED ORDER — APIXABAN 2.5 MG PO TABS
2.5000 mg | ORAL_TABLET | Freq: Two times a day (BID) | ORAL | 0 refills | Status: DC
  Filled 2024-03-12: qty 60, 30d supply, fill #0

## 2024-03-12 MED ORDER — TORSEMIDE 20 MG PO TABS
40.0000 mg | ORAL_TABLET | Freq: Every day | ORAL | 0 refills | Status: DC
  Filled 2024-03-12: qty 60, 30d supply, fill #0

## 2024-03-12 MED ORDER — PREDNISONE 20 MG PO TABS
20.0000 mg | ORAL_TABLET | Freq: Every day | ORAL | 0 refills | Status: DC
  Filled 2024-03-12: qty 30, 30d supply, fill #0

## 2024-03-12 MED ORDER — TAMSULOSIN HCL 0.4 MG PO CAPS
0.4000 mg | ORAL_CAPSULE | Freq: Every day | ORAL | 0 refills | Status: DC
  Filled 2024-03-12: qty 30, 30d supply, fill #0

## 2024-03-12 MED ORDER — DAPAGLIFLOZIN PROPANEDIOL 10 MG PO TABS
10.0000 mg | ORAL_TABLET | Freq: Every day | ORAL | 0 refills | Status: DC
  Filled 2024-03-12: qty 30, 30d supply, fill #0

## 2024-03-12 NOTE — TOC Progression Note (Signed)
 Transition of Care (TOC) - Progression Note    Patient Details  Name: Cameron Foster. MRN: [id number] Date of Birth: 1940-04-24  Transition of Care [facility]) CM/SW Contact  Lauraine JAYSON Carpen, LCSW Phone Number: 03/12/2024, 9:13 AM  Clinical Narrative:   Received voicemail from sister confirming she is going to take patient home with her at discharge. She requested discharge tomorrow so she can get everything arranged for him. CSW left her a voicemail asking for her address so this can be given to the [facility] liaison. They confirmed they can still provide PT, OT, RN, aide. Also asked if she wanted 3-in-1.   Expected Discharge Plan: Home w Home Health Services Barriers to Discharge: Continued Medical Work up               Expected Discharge Plan and Services       Living arrangements for the past 2 months: Single Family Home                                       Social Drivers of Health (SDOH) Interventions SDOH Screenings   Food Insecurity: No Food Insecurity (03/01/2024)  Housing: Low Risk  (03/01/2024)  Transportation Needs: No Transportation Needs (03/01/2024)  Utilities: Not At Risk (03/01/2024)  Alcohol Screen: Medium Risk (01/28/2024)  Depression (PHQ2-9): High Risk (02/05/2024)  Financial Resource Strain: Low Risk  (01/28/2024)  Physical Activity: Inactive (01/28/2024)  Social Connections: Moderately Integrated (03/01/2024)  Stress: No Stress Concern Present (01/28/2024)  Recent Concern: Stress - Stress Concern Present (11/24/2023)  Tobacco Use: High Risk (02/29/2024)  Health Literacy: Adequate Health Literacy (05/31/2023)    Readmission Risk Interventions     No data to display

## 2024-03-12 NOTE — Progress Notes (Signed)
 Occupational Therapy Treatment Patient Details Name: Cameron Foster. MRN: [id number] DOB: 10-Apr-1941 Today's Date: 03/12/2024   History of present illness Pt is an 83 y/o M admitted on 02/29/24 after presenting with c/o worsening LE edema, abdominal swelling, referred from cardiology office with anasarca & low BP. Pt is being treated for acute on chronic systolic CHF. PMH: CHF, stroke, depression, anxiety, HTN, HLD, prediabetes, CAD, CKD3A, a-fib on Eliquis , R bundle blockade, IBS, obesity, alcohol use, SAH, prostate CA, colitis   OT comments  Pt seen for OT treatment on this date. Upon arrival to room pt seated in the recliner, agreeable to tx. Pt reports he is eager to get cleaned up. Pt requires CGA + RW use for STS from recliner/ambulation around bed to sink for grooming/bathing tasks.Pt requires CGA progressing to supervision for static standing, setupA for standing urinal use at sink. Noted improved activity tolerance during sink level grooming without the need for a rest break ~33mins. NT in room to prep pt for discharge. Pt making good progress toward goals, will continue to follow POC. Discharge recommendation remains appropriate.        If plan is discharge home, recommend the following:  A little help with walking and/or transfers;A little help with bathing/dressing/bathroom;Assistance with cooking/housework;Assist for transportation;Help with stairs or ramp for entrance   Equipment Recommendations  BSC/3in1    Recommendations for Other Services      Precautions / Restrictions Precautions Precautions: Fall Recall of Precautions/Restrictions: Intact Precaution/Restrictions Comments: watch orthostatics Restrictions Weight Bearing Restrictions Per Provider Order: No       Mobility Bed Mobility               General bed mobility comments: NT in recliner pre/post session    Transfers Overall transfer level: Needs assistance Equipment used: Rolling walker (2  wheels) Transfers: Sit to/from Stand Sit to Stand: Contact guard assist           General transfer comment: STS from recliner with good use of hand position, stood at the sink for ADL grooming     Balance Overall balance assessment: Needs assistance Sitting-balance support: Feet supported Sitting balance-Leahy Scale: Good     Standing balance support: Bilateral upper extremity supported, During functional activity, Reliant on assistive device for balance Standing balance-Leahy Scale: Fair                             ADL either performed or assessed with clinical judgement   ADL Overall ADL's : Needs assistance/impaired     Grooming: Oral care;Wash/dry face;Wash/dry hands;Standing;Supervision/safety;Contact guard assist   Upper Body Bathing: Contact guard assist;Standing       Upper Body Dressing : Standing;Supervision/safety   Lower Body Dressing: Contact guard assist;Sit to/from stand   Toilet Transfer: Ambulation;Rolling walker (2 wheels);Contact guard assist Toilet Transfer Details (indicate cue type and reason): Simulated toilet t/f CGA Toileting- Clothing Manipulation and Hygiene: Sit to/from stand;Set up Toileting - Clothing Manipulation Details (indicate cue type and reason): Setup with urinal     Functional mobility during ADLs: Rolling walker (2 wheels);Contact guard assist General ADL Comments: CGA-progressing to supervision with observered safety awareness     Communication Communication Communication: No apparent difficulties   Cognition Arousal: Alert Behavior During Therapy: WFL for tasks assessed/performed Cognition: No apparent impairments             OT - Cognition Comments: A/Ox4  Following commands: Intact        Cueing   Cueing Techniques: Verbal cues  Exercises Exercises: Other exercises Other Exercises Other Exercises: Edu: Role of OT session, safe ADL completion, DME management    Shoulder  Instructions       General Comments Assisted pt with phone attempting to get into his mychart account    Pertinent Vitals/ Pain       Pain Assessment Pain Assessment: No/denies pain                                                          Frequency  Min 2X/week        Progress Toward Goals  OT Goals(current goals can now be found in the care plan section)  Progress towards OT goals: Progressing toward goals  Acute Rehab OT Goals OT Goal Formulation: With patient Time For Goal Achievement: 03/17/24 Potential to Achieve Goals: Fair ADL Goals Pt Will Perform Grooming: with modified independence Pt Will Perform Lower Body Dressing: with contact guard assist;sit to/from stand Pt Will Transfer to Toilet: with contact guard assist;ambulating Pt Will Perform Toileting - Clothing Manipulation and hygiene: with contact guard assist;sit to/from stand  Plan      Co-evaluation                 AM-PAC OT 6 Clicks Daily Activity     Outcome Measure   Help from another person eating meals?: None Help from another person taking care of personal grooming?: A Little Help from another person toileting, which includes using toliet, bedpan, or urinal?: A Little Help from another person bathing (including washing, rinsing, drying)?: A Little Help from another person to put on and taking off regular upper body clothing?: A Little Help from another person to put on and taking off regular lower body clothing?: A Little 6 Click Score: 19    End of Session Equipment Utilized During Treatment: Rolling walker (2 wheels)  OT Visit Diagnosis: Unsteadiness on feet (R26.81);Repeated falls (R29.6);Muscle weakness (generalized) (M62.81)   Activity Tolerance Patient tolerated treatment well   Patient Left with nursing/sitter in room;with call bell/phone within reach   Nurse Communication Mobility status        Time: 8588-8567 OT Time Calculation (min): 21  min  Charges: OT General Charges $OT Visit: 1 Visit OT Treatments $Self Care/Home Management : 8-22 mins  Larraine Colas M.S. OTR/L  03/12/24, 3:30 PM

## 2024-03-12 NOTE — Progress Notes (Signed)
 03/12/24 1600  PT Visit Information  Assistance Needed +1  History of Present Illness Pt is an 83 y/o M admitted on 02/29/24 after presenting with c/o worsening LE edema, abdominal swelling, referred from cardiology office with anasarca & low BP. Pt is being treated for acute on chronic systolic CHF. PMH: CHF, stroke, depression, anxiety, HTN, HLD, prediabetes, CAD, CKD3A, a-fib on Eliquis , R bundle blockade, IBS, obesity, alcohol use, SAH, prostate CA, colitis  Subjective Data  Patient Stated Goal get better  Precautions  Precautions Fall  Recall of Precautions/Restrictions Intact  Precaution/Restrictions Comments watch orthostatics  Restrictions  Weight Bearing Restrictions Per Provider Order No  Pain Assessment  Pain Assessment No/denies pain  Cognition  Arousal Alert  Behavior During Therapy [facility] for tasks assessed/performed  Following Commands  Following commands Intact  Cueing  Cueing Techniques Verbal cues  Communication  Communication No apparent difficulties  Bed Mobility  General bed mobility comments NT in recliner pre/post session  Transfers  Overall transfer level Needs assistance  Equipment used Rolling walker (2 wheels)  Transfers Sit to/from Stand  Sit to Stand Contact guard assist  Ambulation/Gait  Ambulation/Gait assistance Contact guard assist  Gait Distance (Feet) 40 Feet  Assistive device Rolling walker (2 wheels)  Gait Pattern/deviations Decreased step length - left;Decreased step length - right;Decreased stride length  General Gait Details education to ambulate within base of AD. Sometimes ambulates too fast.  Gait velocity decreased  Stairs Yes  Stairs assistance Min assist  Stair Management One rail Left;Forwards;Step to pattern  Number of Stairs 3  General stair comments  (Pt slightly unsteady due to impulsivity. No true LOB or buckling)  Balance  Overall balance assessment Needs assistance  Sitting-balance support Feet supported  Sitting  balance-Leahy Scale Good  Standing balance support Bilateral upper extremity supported;During functional activity;Reliant on assistive device for balance  Standing balance-Leahy Scale Fair  Exercises  Exercises Other exercises  Other Exercises  Other Exercises  (Pt educated on safe sair negotiation since he will now be d/c'd to sister's home due to insurance issues with STR)  PT - End of Session  Equipment Utilized During Treatment Gait belt  Activity Tolerance Patient tolerated treatment well  Patient left in chair;with call bell/phone within reach;with chair alarm set  Nurse Communication Mobility status   PT - Assessment/Plan  PT Visit Diagnosis Muscle weakness (generalized) (M62.81);Other abnormalities of gait and mobility (R26.89)  PT Frequency (ACUTE ONLY) Min 2X/week  Follow Up Recommendations Skilled nursing-short term rehab (<3 hours/day)  Can patient physically be transported by private vehicle Yes  Patient can return home with the following A little help with walking and/or transfers;A little help with bathing/dressing/bathroom;Assist for transportation;Help with stairs or ramp for entrance  PT equipment None recommended by PT (TBD, if going home ? BSC)  AM-PAC PT 6 Clicks Mobility Outcome Measure (Version 2)  Help needed turning from your back to your side while in a flat bed without using bedrails? 3  Help needed moving from lying on your back to sitting on the side of a flat bed without using bedrails? 3  Help needed moving to and from a bed to a chair (including a wheelchair)? 3  Help needed standing up from a chair using your arms (e.g., wheelchair or bedside chair)? 2  Help needed to walk in hospital room? 3  Help needed climbing 3-5 steps with a railing?  3  6 Click Score 17  Consider Recommendation of Discharge To: Home with [facility]  Progressive  Mobility  What is the highest level of mobility based on the mobility assessment? Level 4 (Ambulates with assistance) -  Balance while stepping forward/back - Complete  Activity Ambulated with assistance  PT Goal Progression  Progress towards PT goals Progressing toward goals  PT Time Calculation  PT Start Time (ACUTE ONLY) 1307  PT Stop Time (ACUTE ONLY) 1323  PT Time Calculation (min) (ACUTE ONLY) 16 min  PT General Charges  $$ ACUTE PT VISIT 1 Visit  PT Treatments  $Therapeutic Activity 8-22 mins

## 2024-03-12 NOTE — Progress Notes (Signed)
 Good overall tolerance for PT session this date. Remains weak requiring RW for safe mobility. No c/o dizziness after receiving Iron  via IV. BP remains on the softer side, vitals placed in chart. Initial recs for STR once medically cleared remain appropriate.    03/11/24 1400  PT Visit Information  Assistance Needed +1  History of Present Illness Pt is an 83 y/o M admitted on 02/29/24 after presenting with c/o worsening LE edema, abdominal swelling, referred from cardiology office with anasarca & low BP. Pt is being treated for acute on chronic systolic CHF. PMH: CHF, stroke, depression, anxiety, HTN, HLD, prediabetes, CAD, CKD3A, a-fib on Eliquis , R bundle blockade, IBS, obesity, alcohol use, SAH, prostate CA, colitis  Subjective Data  Patient Stated Goal get better  Precautions  Precautions Fall  Recall of Precautions/Restrictions Intact  Precaution/Restrictions Comments watch orthostatics  Restrictions  Weight Bearing Restrictions Per Provider Order No  Pain Assessment  Pain Assessment No/denies pain  Cognition  Arousal Alert  Behavior During Therapy WFL for tasks assessed/performed  PT - Cognitive impairments No apparent impairments  Following Commands  Following commands Intact  Cueing  Cueing Techniques Verbal cues  Communication  Communication No apparent difficulties  Bed Mobility  General bed mobility comments  (NT pt up in chair pre/post session)  Transfers  Overall transfer level Needs assistance  Equipment used None  Transfers Sit to/from Stand  Sit to Stand Contact guard assist  General transfer comment  (Cues for hand placement)  Ambulation/Gait  Ambulation/Gait assistance Contact guard assist  Gait Distance (Feet) 160 Feet  Assistive device Rolling walker (2 wheels)  Gait Pattern/deviations Decreased step length - left;Decreased step length - right;Decreased stride length  General Gait Details education to ambulate within base of AD. Sometimes ambulates too  fast.  Balance  Overall balance assessment Needs assistance  Sitting-balance support Feet supported  Sitting balance-Leahy Scale Good  Standing balance support Bilateral upper extremity supported;During functional activity;Reliant on assistive device for balance  Standing balance-Leahy Scale Fair  General Comments  General comments (skin integrity, edema, etc.)  (BP assessed and placed in chart. No significant dizziness limiting mobility. Vitals improved after receiving Iron , however BP remains on the lower side)  Other Exercises  Other Exercises  (Pt educated on role of acute PT and benefits of STR at d/c.)  PT - End of Session  Equipment Utilized During Treatment Gait belt  Activity Tolerance Patient tolerated treatment well  Patient left in chair;with call bell/phone within reach;with chair alarm set  Nurse Communication Mobility status   PT - Assessment/Plan  PT Visit Diagnosis Muscle weakness (generalized) (M62.81);Other abnormalities of gait and mobility (R26.89)  PT Frequency (ACUTE ONLY) Min 2X/week  Follow Up Recommendations Skilled nursing-short term rehab (<3 hours/day)  Patient can return home with the following A little help with walking and/or transfers;A little help with bathing/dressing/bathroom;Assist for transportation;Help with stairs or ramp for entrance  PT equipment None recommended by PT (TBD, ?BSC if going home)  AM-PAC PT 6 Clicks Mobility Outcome Measure (Version 2)  Help needed turning from your back to your side while in a flat bed without using bedrails? 3  Help needed moving from lying on your back to sitting on the side of a flat bed without using bedrails? 3  Help needed moving to and from a bed to a chair (including a wheelchair)? 3  Help needed standing up from a chair using your arms (e.g., wheelchair or bedside chair)? 2  Help needed to walk in hospital  room? 3  Help needed climbing 3-5 steps with a railing?  3  6 Click Score 17  Consider  Recommendation of Discharge To: Home with [facility]  Progressive Mobility  What is the highest level of mobility based on the mobility assessment? Level 4 (Ambulates with assistance) - Balance while stepping forward/back - Complete  Mobility Referral Yes  Activity Ambulated with assistance  PT Goal Progression  Progress towards PT goals Progressing toward goals  PT Time Calculation  PT Start Time (ACUTE ONLY) 1352  PT Stop Time (ACUTE ONLY) 1410  PT Time Calculation (min) (ACUTE ONLY) 18 min  PT General Charges  $$ ACUTE PT VISIT 1 Visit  PT Treatments  $Therapeutic Activity 8-22 mins   Darice Bohr, PTA

## 2024-03-12 NOTE — TOC Transition Note (Signed)
 Transition of Care [facility]) - Discharge Note   Patient Details  Name: Cameron Foster. MRN: [id number] Date of Birth: Apr 02, 1941  [facility] CM/SW Contact:  Lauraine JAYSON Carpen, LCSW Phone Number: 03/12/2024, 10:19 AM   Clinical Narrative: Patient has orders to discharge home today. Per sister, he will return home with her and stay through Thanksgiving. Will return home on Friday. [facility] is aware and will resume services on Friday. Patient was reconsidering SNF but after phone conversation with sister, agreed to return home. If patient decides he wants SNF after discharge, [facility] will work with their branch to find a place for him. They do not currently have a child psychotherapist. Patient declined 3-in-1. No further concerns. Sister will pick him up around 4:30. CSW signing off.  Final next level of care: Home w Home Health Services Barriers to Discharge: Barriers Resolved   Patient Goals and CMS Choice            Discharge Placement                Patient to be transferred to facility by: Sister Name of family member notified: [name] Patient and family notified of of transfer: 03/12/24  Discharge Plan and Services Additional resources added to the After Visit Summary for                            [facility] Arranged: RN, OT, PT, Nurse's Aide HH Agency: [facility] ([facility]) Date [facility] Agency Contacted: 03/12/24   Representative spoke with at [facility] Agency: Shaun  Social Drivers of Health (SDOH) Interventions SDOH Screenings   Food Insecurity: No Food Insecurity (03/01/2024)  Housing: Low Risk  (03/01/2024)  Transportation Needs: No Transportation Needs (03/01/2024)  Utilities: Not At Risk (03/01/2024)  Alcohol Screen: Medium Risk (01/28/2024)  Depression (PHQ2-9): High Risk (02/05/2024)  Financial Resource Strain: Low Risk  (01/28/2024)  Physical Activity: Inactive (01/28/2024)  Social Connections: Moderately Integrated (03/01/2024)   Stress: No Stress Concern Present (01/28/2024)  Recent Concern: Stress - Stress Concern Present (11/24/2023)  Tobacco Use: High Risk (02/29/2024)  Health Literacy: Adequate Health Literacy (05/31/2023)     Readmission Risk Interventions     No data to display

## 2024-03-12 NOTE — Plan of Care (Signed)
  Problem: Clinical Measurements: Goal: Ability to maintain clinical measurements within normal limits will improve Outcome: Progressing   Problem: Clinical Measurements: Goal: Respiratory complications will improve Outcome: Progressing   Problem: Activity: Goal: Risk for activity intolerance will decrease Outcome: Progressing   Problem: Pain Managment: Goal: General experience of comfort will improve and/or be controlled Outcome: Progressing   Problem: Safety: Goal: Ability to remain free from injury will improve Outcome: Progressing

## 2024-03-12 NOTE — Discharge Summary (Signed)
 Physician Discharge Summary   Patient: Cameron Foster. MRN: [id number] DOB: 1941/01/05  Admit date:     02/29/2024  Discharge date: 03/12/24  Discharge Physician: Cameron Foster   PCP: Cameron Foster   Recommendations at discharge:   Follow-up PCP 5 days Follow-up CHF clinic Follow-up cardiology 2 weeks Recommend checking CBC and BMP with follow-up appointment.  Need to keep a close eye on hemoglobin.  Discharge Diagnoses: Principal Problem:   Acute on chronic systolic CHF (congestive heart failure) (HCC) Active Problems:   Hypotension   Pericardial effusion   CAD (coronary artery disease)   Acute kidney injury superimposed on CKD   Normocytic anemia   Paroxysmal atrial fibrillation (HCC)   Hyperlipidemia LDL goal <70   Depression with anxiety   Obesity (BMI 30-39.9)   Fatigue   Anemia of chronic disease   Persistent atrial fibrillation (HCC)   Acute on chronic heart failure with preserved ejection fraction (HFpEF) (HCC)   Heart failure with recovered ejection fraction (HFrecEF) [facility])   Hospital Course: 83 y.o. male with medical history significant of CHF with EF 30 to 35%, stroke, depression with anxiety, hypertension, hyperlipidemia, prediabetes, CAD, CKD-3A, A-fib on Eliquis , right bundle blockade, IBS, obesity, alcohol use, who presents with anasarca and low blood pressure.  Was sent to ED from clinic.  Patient was admitted for acute on chronic heart failure.   11/19.  Patient feeling tired and hemoglobin drifted down to 7.3.  Patient agreeable to blood transfusion.  Limited with meds with heart failure secondary to hypotension. 11/20.  Hemoglobin up to 9.3 after transfusion.  Blood pressure did come up.  Cardiology restarted torsemide , Farxiga  and Toprol . 11/21.  Creatinine 1.86 today. 11/22.  Creatinine 1.98.  Repeat echocardiogram does not show any evidence of tamponade and EF 55 to 60%.  Weight down to 201.94 today. 11/23.  Hemoglobin 8.6.  Nursing  staff weight on standing scale to get a more accurate weight. 11/24.  Hemoglobin 8.4.  Patient with slight orthostasis but was asymptomatic and able to ambulate.  IV iron  given today. 11/25.  Patient's sister wanted to take him home with her instead of going out to rehab but they did not want.  Creatinine 1.92 on discharge and hemoglobin 8.4.  Assessment and Plan: * Acute on chronic systolic CHF (congestive heart failure) (HCC) Ejection fraction has improved from September.  EF now 55 to 60% (heart failure with recovered ejection fraction).  Cardiology restarted torsemide , Farxiga  and Toprol  on 11/20.  No ACE/ARB secondary to fluctuating renal function and hypotension.  Will lower the dose of Toprol  to 12.5 mg daily for tomorrow.  Patient has slight orthostasis but asymptomatic.  Pericardial effusion Pericardial effusion does not show any evidence of tamponade on echocardiogram today.  Normal EF.  Cardiology recommended prednisone  20 mg daily for 2 weeks then tapering dose after that.  Hypotension Slightly orthostatic but asymptomatic.  Will decrease Toprol  dose for tomorrow.  Acute kidney injury superimposed on CKD Looks like chronic kidney disease is going to be stage IIIb.  Last creatinine 1.92 with a GFR of 34.  Creatinine peak at 2.37 on presentation.  Lowest creatinine 1.86.  Normocytic anemia Patient given 1 unit of packed red blood cells on 11/19.  Hemoglobin 8.4.  Gave IV iron  11/24.  Need to keep a close eye on hemoglobin since cardiology restarted Eliquis .  No signs of bleeding.  Paroxysmal atrial fibrillation [facility]) Cardiology restarted Eliquis  on 11/20.  Patient on amiodarone .  Cardiology restarted Toprol   on 11/20.  Hyperlipidemia LDL goal <70 Continue Lipitor  Depression with anxiety On Zoloft   Obesity (BMI 30-39.9) Weight to go up and down.  Today's BMI 30.86.  Class I obesity  Fatigue Could be secondary to anemia since felt better after transfusion.  Patient wore the  BiPAP for borderline elevated pCO2.  Continue CPAP at home.         Consultants: Cardiology Procedures performed: None Disposition: Home health Diet recommendation:  Cardiac diet DISCHARGE MEDICATION: Allergies as of 03/12/2024       Reactions   Amoxicillin -pot Clavulanate Diarrhea, Other (See Comments)   Abdominal upset amoxicillin  / clavulanate   Formaldehyde Rash   From shoes made with this material        Medication List     STOP taking these medications    Jardiance  10 MG Tabs tablet Generic drug: empagliflozin    multivitamin with minerals Tabs tablet   tadalafil  5 MG tablet Commonly known as: CIALIS        TAKE these medications    albuterol  108 (90 Base) MCG/ACT inhaler Commonly known as: VENTOLIN  HFA Inhale 2 puffs into the lungs every 6 (six) hours as needed for wheezing or shortness of breath.   Alpha-Lipoic Acid 600 MG Tabs Take 1 tablet by mouth daily.   amiodarone  200 MG tablet Commonly known as: PACERONE  Take 1 tablet (200 mg total) by mouth daily.   atorvastatin  40 MG tablet Commonly known as: LIPITOR Take 1 tablet (40 mg total) by mouth daily.   cetirizine  10 MG tablet Commonly known as: ZYRTEC  Take 10 mg by mouth daily.   cyanocobalamin  1000 MCG tablet Commonly known as: VITAMIN B12 Take 1,000 mcg by mouth daily.   dextromethorphan -guaiFENesin  30-600 MG 12hr tablet Commonly known as: MUCINEX  DM Take 1 tablet by mouth 2 (two) times daily as needed for cough.   docusate sodium  100 MG capsule Commonly known as: COLACE Take 1 capsule (100 mg total) by mouth 2 (two) times daily.   Eliquis  2.5 MG Tabs tablet Generic drug: apixaban  Take 1 tablet (2.5 mg total) by mouth 2 (two) times daily. What changed:  medication strength how much to take   Farxiga  10 MG Tabs tablet Generic drug: dapagliflozin  propanediol Take 1 tablet (10 mg total) by mouth daily.   fluticasone  50 MCG/ACT nasal spray Commonly known as: FLONASE  PLACE 1  SPRAY INTO BOTH NOSTRILS DAILY AS NEEDED FOR ALLERGIES OR RHINITIS.   folic acid  1 MG tablet Commonly known as: FOLVITE  Take 1 tablet (1 mg total) by mouth daily.   hydrocortisone  2.5 % cream APPLY TO AFFECTED AREA TWICE A DAY AS NEEDED FOR RASH   ketoconazole  2 % cream Commonly known as: NIZORAL  Apply once or twice daily to affected skin folds as needed for rash   metoprolol  succinate 25 MG 24 hr tablet Commonly known as: TOPROL -XL Take 0.5 tablets (12.5 mg total) by mouth daily. What changed:  medication strength how much to take additional instructions   omeprazole  20 MG capsule Commonly known as: PRILOSEC Take 1 capsule (20 mg total) by mouth daily as needed. What changed: Another medication with the same name was removed. Continue taking this medication, and follow the directions you see here.   potassium chloride  SA 20 MEQ tablet Commonly known as: KLOR-CON  M Take 2 tablets (40 mEq total) by mouth daily.   predniSONE  20 MG tablet Commonly known as: DELTASONE  Take 1 tablet (20 mg total) by mouth daily with breakfast.   sertraline  100 MG tablet Commonly  known as: ZOLOFT  Take 1 tablet (100 mg total) by mouth daily.   tamsulosin  0.4 MG Caps capsule Commonly known as: FLOMAX  Take 1 capsule (0.4 mg total) by mouth daily.   thiamine  100 MG tablet Commonly known as: VITAMIN B1 Take 100 mg by mouth daily.   torsemide  20 MG tablet Commonly known as: DEMADEX  Take 2 tablets (40 mg total) by mouth daily. What changed: when to take this   Vitamin D-3 25 MCG (1000 UT) Caps Take 1,000 Units by mouth daily.   zinc  gluconate 50 MG tablet Take 50 mg by mouth daily.        Follow-up Information     [facility]. Go on 03/11/2024.   Specialty: Cardiology Why: Hospital follow-up 03/26/24 @ 10:00 AM Please bring all medications to follow-up appointment [facility], [address], Second floor Free Valet Parking at the  [employer] information: [address] [place]  [address] [phone]        Cameron Foster. Go on 03/19/2024.   Specialty: Family Medicine Why: @ 10:00am Contact information: [address] [place] [address] [phone]         [facility] Follow up in 10 day(s).   Specialty: Cardiology Contact information: [address] [place] [address] [phone]         [facility] Follow up.   Specialty: Home Health Services Why: They will resume home health services on Friday 11/28 Contact information: [address] [place]  [address] [phone]               Discharge Exam: Filed Weights   03/10/24 1356 03/11/24 0500 03/12/24 0654  Weight: 93.3 kg 95.4 kg 94.8 kg   Physical Exam HENT:     Head: Normocephalic.  Eyes:     General: Lids are normal.     Conjunctiva/sclera: Conjunctivae normal.  Cardiovascular:     Rate and Rhythm: Normal rate and regular rhythm.     Heart sounds: S1 normal and S2 normal. Murmur heard.     Systolic murmur is present with a grade of 2/6.  Pulmonary:     Breath sounds: No decreased breath sounds, wheezing, rhonchi or rales.  Musculoskeletal:     Right lower leg: Swelling present.     Left lower leg: Swelling present.  Skin:    General: Skin is warm.     Findings: No rash.  Neurological:     Mental Status: He is alert.      Condition at discharge: stable  The results of significant diagnostics from this hospitalization (including imaging, microbiology, ancillary and laboratory) are listed below for reference.   Imaging Studies: ECHOCARDIOGRAM LIMITED Result Date: 03/09/2024    ECHOCARDIOGRAM LIMITED REPORT   Patient Name:   Nakai Pollio. Date of Exam: 03/09/2024 Medical Rec #:  [id number]          Height:       69.0 in Accession #:    [id number]         Weight:       201.9 lb Date of Birth:   09-Aug-1940          BSA:          2.075 m Patient Age:    83 years           BP:           106/71  mmHg Patient Gender: M                  HR:           76 bpm. Exam Location:  [facility] Procedure: Limited Echo (Both Spectral and Color Flow Doppler were utilized            during procedure). Indications:     Pericardial effusion I31.3  History:         Patient has prior history of Echocardiogram examinations, most                  recent 03/04/2024.  Sonographer:     Thedora Louder RDCS, FASE Referring Phys:  [id number] RONALD BERGE Diagnosing Phys: Caron Poser IMPRESSIONS  1. Left ventricular ejection fraction, by estimation, is 55 to 60%. The left ventricle has normal function. There is moderate left ventricular hypertrophy.  2. Right ventricular systolic function is normal. The right ventricular size is normal.  3. Moderate pericardial effusion. There is no evidence of cardiac tamponade. No RV diastolic collapse or significant mitral/tricuspid inflow variation present.  4. The inferior vena cava is dilated in size with <50% respiratory variability, suggesting right atrial pressure of 15 mmHg. Comparison(s): A prior study was performed on 03/03/2024. No significant change from prior study. FINDINGS  Left Ventricle: Left ventricular ejection fraction, by estimation, is 55 to 60%. The left ventricle has normal function. The left ventricular internal cavity size was normal in size. There is moderate left ventricular hypertrophy. Right Ventricle: The right ventricular size is normal. Right vetricular wall thickness was not well visualized. Right ventricular systolic function is normal. Pericardium: A moderately sized pericardial effusion is present. The pericardial effusion appears to contain mixed echogenic material. There is no evidence of cardiac tamponade. Venous: The inferior vena cava is dilated in size with less than 50% respiratory variability, suggesting right atrial pressure of 15 mmHg. Additional  Comments: Spectral Doppler performed.  LEFT VENTRICLE PLAX 2D LVIDd:         4.65 cm LVIDs:         3.20 cm LV PW:         1.30 cm LV IVS:        1.25 cm  LEFT ATRIUM         Index LA diam:    4.80 cm 2.31 cm/m Caron Poser Electronically signed by Caron Poser Signature Date/Time: 03/09/2024/10:08:21 AM    Final    DG Chest 2 View Result Date: 03/05/2024 CLINICAL DATA:  Shortness of breath. EXAM: CHEST - 2 VIEW COMPARISON:  02/29/2024. FINDINGS: Low lung volumes. Stable cardiomegaly. Aortic atherosclerosis. Bilateral diffuse hazy opacities, favoring pulmonary edema. No sizable pleural effusion or pneumothorax. Degenerative changes of the spine. No acute osseous abnormality. IMPRESSION: Cardiomegaly with bilateral diffuse hazy airspace opacities, favoring pulmonary edema. Electronically Signed   By: Harrietta Sherry M.D.   On: 03/05/2024 15:34   ECHOCARDIOGRAM LIMITED Result Date: 03/04/2024    ECHOCARDIOGRAM LIMITED REPORT   Patient Name:   Arnett Duddy. Date of Exam: 03/03/2024 Medical Rec #:  [id number]          Height:       69.0 in Accession #:    [id number]         Weight:       208.1 lb Date of Birth:  03/12/1941          BSA:          2.101 m Patient Age:  83 years           BP:           111/82 mmHg Patient Gender: M                  HR:           80 bpm. Exam Location:  [facility] Procedure: 2D Echo, Limited Echo and Cardiac Doppler (Both Spectral and Color            Flow Doppler were utilized during procedure). Indications:     Pericardial effusion  History:         Patient has prior history of Echocardiogram examinations.  Sonographer:     [name] Rubinstein RDCS Referring Phys:  [id number] [facility] Diagnosing Phys: Annabella Scarce Foster  Sonographer Comments: Image acquisition challenging due to respiratory motion. IMPRESSIONS  1. Left ventricular ejection fraction, by estimation, is 60 to 65%. The left ventricle has normal function. The left ventricle has no regional wall motion  abnormalities. There is mild concentric left ventricular hypertrophy.  2. Right ventricular systolic function is normal.  3. Pericardial effusion is unchanged in size from 03/01/24. There is hyperechoic densities anterior to the right ventricle unchanged from prior concerning for thrombus or fibrinous material. No evidence of tamponade. Moderate pericardial effusion. The pericardial effusion is circumferential. There is no evidence of cardiac tamponade.  4. The inferior vena cava is normal in size with greater than 50% respiratory variability, suggesting right atrial pressure of 3 mmHg. FINDINGS  Left Ventricle: Left ventricular ejection fraction, by estimation, is 60 to 65%. The left ventricle has normal function. The left ventricle has no regional wall motion abnormalities. There is mild concentric left ventricular hypertrophy. Right Ventricle: Right ventricular systolic function is normal. Pericardium: Pericardial effusion is unchanged in size from 03/01/24. There is hyperechoic densities anterior to the right ventricle unchanged from prior concerning for thrombus or fibrinous material. No evidence of tamponade. A moderately sized pericardial effusion is present. The pericardial effusion is circumferential. The pericardial effusion appears to contain fibrous material. There is excessive respiratory variation in the tricuspid valve spectral Doppler velocities. There is no evidence of cardiac tamponade. Venous: The inferior vena cava is normal in size with greater than 50% respiratory variability, suggesting right atrial pressure of 3 mmHg. LEFT VENTRICLE PLAX 2D LVIDd:         4.30 cm LVIDs:         3.10 cm LV PW:         1.30 cm LV IVS:        1.30 cm  LEFT ATRIUM         Index LA diam:    4.00 cm 1.90 cm/m Annabella Scarce Foster Electronically signed by Annabella Scarce Foster Signature Date/Time: 03/04/2024/8:43:10 AM    Final    ECHOCARDIOGRAM LIMITED Result Date: 03/01/2024    ECHOCARDIOGRAM LIMITED REPORT    Patient Name:   Nikolas Casher. Date of Exam: 03/01/2024 Medical Rec #:  [id number]          Height:       69.0 in Accession #:    [id number]         Weight:       209.0 lb Date of Birth:  [birth year]          BSA:          2.105 m Patient Age:    25 years  BP:           99/64 mmHg Patient Gender: M                  HR:           70 bpm. Exam Location:  [facility] Procedure: Limited Echo, Cardiac Doppler and Color Doppler (Both Spectral and            Color Flow Doppler were utilized during procedure). Indications:     Cardiomyopathy--unspecified I42.9  History:         Patient has prior history of Echocardiogram examinations, most                  recent 01/03/2024. Arrythmias:Atrial Fibrillation.  Sonographer:     Christopher Furnace Referring Phys:  [id number] RYAN M DUNN Diagnosing Phys: Caron Poser IMPRESSIONS  1. Left ventricular ejection fraction, by estimation, is 60 to 65%. The left ventricle has normal function. Left ventricular endocardial border not optimally defined to evaluate regional wall motion. There is moderate left ventricular hypertrophy. Left ventricular diastolic parameters are indeterminate.  2. Right ventricular systolic function is normal. The right ventricular size is normal. Tricuspid regurgitation signal is inadequate for assessing PA pressure.  3. Moderate pericardial effusion. The pericardial effusion is circumferential. No RV diastolic collapse visualized. Mitral and tricuspid inflow not performed.  4. The inferior vena cava is dilated in size with <50% respiratory variability, suggesting right atrial pressure of 15 mmHg. Comparison(s): A prior study was performed on 12/22/2023. There is now evidence of a moderate-sized pericardial effusion. FINDINGS  Left Ventricle: Left ventricular ejection fraction, by estimation, is 60 to 65%. The left ventricle has normal function. Left ventricular endocardial border not optimally defined to evaluate regional wall motion. The left ventricular internal  cavity size was normal in size. There is moderate left ventricular hypertrophy. Abnormal (paradoxical) septal motion, consistent with left bundle branch block. Left ventricular diastolic parameters are indeterminate. Left ventricular diastolic function could not be evaluated due to atrial fibrillation. Right Ventricle: The right ventricular size is normal. Right vetricular wall thickness was not well visualized. Right ventricular systolic function is normal. Tricuspid regurgitation signal is inadequate for assessing PA pressure. Left Atrium: Left atrial size was not assessed. Right Atrium: Right atrial size was not assessed. Pericardium: A moderately sized pericardial effusion is present. The pericardial effusion is circumferential. The pericardial effusion appears to contain mixed echogenic material. Mitral Valve: The mitral valve is degenerative in appearance. There is mild calcification of the mitral valve leaflet(s). Moderate mitral annular calcification. No evidence of mitral valve stenosis. Tricuspid Valve: The tricuspid valve is not well visualized. Tricuspid valve regurgitation is not demonstrated. No evidence of tricuspid stenosis. Aortic Valve: The aortic valve is tricuspid. There is moderate calcification of the aortic valve. Aortic valve regurgitation is not visualized. Aortic valve sclerosis/calcification is present, without any evidence of aortic stenosis. Pulmonic Valve: The pulmonic valve was not well visualized. Pulmonic valve regurgitation is trivial. No evidence of pulmonic stenosis. Aorta: Aortic root could not be assessed. Venous: The inferior vena cava is dilated in size with less than 50% respiratory variability, suggesting right atrial pressure of 15 mmHg. IAS/Shunts: The interatrial septum was not assessed. Additional Comments: Spectral Doppler performed. Color Doppler performed.  LEFT VENTRICLE PLAX 2D LVIDd:         4.80 cm LVIDs:         3.20 cm LV PW:         1.50 cm LV IVS:  1.10  cm LVOT diam:     2.20 cm LVOT Area:     3.80 cm  RIGHT VENTRICLE RV Basal diam:  2.90 cm RV Mid diam:    2.60 cm LEFT ATRIUM         Index LA diam:    4.70 cm 2.23 cm/m   AORTA Ao Root diam: 2.80 cm  SHUNTS Systemic Diam: 2.20 cm Caron Poser Electronically signed by Caron Poser Signature Date/Time: 03/01/2024/1:08:59 PM    Final    DG Chest 2 View Result Date: 02/29/2024 CLINICAL DATA:  Shortness of breath. EXAM: DG CHEST 2V COMPARISON:  Chest radiograph dated 12/31/2023. FINDINGS: No focal consolidation, pleural effusion or pneumothorax. Faint peripheral densities, likely sequela prior inflammatory process. Stable cardiac silhouette. Atherosclerotic calcification of the aorta. No acute osseous pathology. IMPRESSION: No active cardiopulmonary disease. Electronically Signed   By: Vanetta Chou M.D.   On: 02/29/2024 15:39    Microbiology: Results for orders placed or performed during the hospital encounter of 09/12/23  Resp panel by RT-PCR (RSV, Flu A&B, Covid) Anterior Nasal Swab     Status: None   Collection Time: 09/12/23 11:19 AM   Specimen: Anterior Nasal Swab  Result Value Ref Range Status   SARS Coronavirus 2 by RT PCR NEGATIVE NEGATIVE Final    Comment: (NOTE) SARS-CoV-2 target nucleic acids are NOT DETECTED.  The SARS-CoV-2 RNA is generally detectable in upper respiratory specimens during the acute phase of infection. The lowest concentration of SARS-CoV-2 viral copies this assay can detect is 138 copies/mL. A negative result does not preclude SARS-Cov-2 infection and should not be used as the sole basis for treatment or other patient management decisions. A negative result may occur with  improper specimen collection/handling, submission of specimen other than nasopharyngeal swab, presence of viral mutation(s) within the areas targeted by this assay, and inadequate number of viral copies(<138 copies/mL). A negative result must be combined with clinical observations,  patient history, and epidemiological information. The expected result is Negative.  Fact Sheet for Patients:  bloggercourse.com  Fact Sheet for Healthcare Providers:  seriousbroker.it  This test is no t yet approved or cleared by the [place]  FDA and  has been authorized for detection and/or diagnosis of SARS-CoV-2 by FDA under an Emergency Use Authorization (EUA). This EUA will remain  in effect (meaning this test can be used) for the duration of the COVID-19 declaration under Section 564(b)(1) of the Act, 21 U.S.C.section 360bbb-3(b)(1), unless the authorization is terminated  or revoked sooner.       Influenza A by PCR NEGATIVE NEGATIVE Final   Influenza B by PCR NEGATIVE NEGATIVE Final    Comment: (NOTE) The Xpert Xpress SARS-CoV-2/FLU/RSV plus assay is intended as an aid in the diagnosis of influenza from Nasopharyngeal swab specimens and should not be used as a sole basis for treatment. Nasal washings and aspirates are unacceptable for Xpert Xpress SARS-CoV-2/FLU/RSV testing.  Fact Sheet for Patients: bloggercourse.com  Fact Sheet for Healthcare Providers: seriousbroker.it  This test is not yet approved or cleared by the [place]  FDA and has been authorized for detection and/or diagnosis of SARS-CoV-2 by FDA under an Emergency Use Authorization (EUA). This EUA will remain in effect (meaning this test can be used) for the duration of the COVID-19 declaration under Section 564(b)(1) of the Act, 21 U.S.C. section 360bbb-3(b)(1), unless the authorization is terminated or revoked.     Resp Syncytial Virus by PCR NEGATIVE NEGATIVE Final    Comment: (NOTE) Fact Sheet  for Patients: bloggercourse.com  Fact Sheet for Healthcare Providers: seriousbroker.it  This test is not yet approved or cleared by the [place]  [place] FDA and has been authorized for detection and/or diagnosis of SARS-CoV-2 by FDA under an Emergency Use Authorization (EUA). This EUA will remain in effect (meaning this test can be used) for the duration of the COVID-19 declaration under Section 564(b)(1) of the Act, 21 U.S.C. section 360bbb-3(b)(1), unless the authorization is terminated or revoked.  Performed at [facility], [address] [place], [place] [address]    *Note: Due to a large number of results and/or encounters for the requested time period, some results have not been displayed. A complete set of results can be found in Results Review.    Labs: CBC: Recent Labs  Lab 03/06/24 0517 03/07/24 0425 03/09/24 0533 03/10/24 0556 03/11/24 0506 03/12/24 0511  WBC 5.7  --   --  6.1  --   --   HGB 7.9* 9.3* 8.8* 8.6* 8.4* 8.4*  HCT 25.8*  --   --  28.2*  --   --   MCV 89.9  --   --  89.0  --   --   PLT 247  --   --  245  --   --    Basic Metabolic Panel: Recent Labs  Lab 03/07/24 0425 03/08/24 0940 03/09/24 0533 03/11/24 0506 03/12/24 0511  NA 137 136 135 135 135  K 3.8 3.7 3.9 4.0 3.9  CL 95* 97* 97* 98 98  CO2 32 33* 32 30 29  GLUCOSE 97 88 98 100* 90  BUN 20 20 25* 30* 32*  CREATININE 1.91* 1.86* 1.98* 2.02* 1.92*  CALCIUM  9.2 9.1 9.0 9.1 8.9   Liver Function Tests: No results for input(s): AST, ALT, ALKPHOS, BILITOT, PROT, ALBUMIN  in the last 168 hours. CBG: No results for input(s): GLUCAP in the last 168 hours.  Discharge time spent: greater than 30 minutes.  Signed: Charlie Patterson, [facility] 03/12/2024

## 2024-03-13 ENCOUNTER — Telehealth: Payer: Self-pay | Admitting: *Deleted

## 2024-03-13 NOTE — Progress Notes (Signed)
 Complex Care Management Care Guide Note  03/13/2024 Name: Cameron Foster. MRN: [id number] DOB: 1940-07-20  [name]. is a 83 y.o. year old male who is a primary care patient of Myrla, Jon [name], MD and is actively engaged with the care management team. I reached out to [employer]. by phone today to assist with re-scheduling  with the RN Case Manager.  Follow up plan: Unsuccessful telephone outreach attempt made. A HIPAA compliant phone message was left for the patient providing contact information and requesting a return call.  Leotis Rase [facility], [facility] Guide  Direct Dial: [phone]  Fax [phone]

## 2024-03-13 NOTE — Transitions of Care (Post Inpatient/ED Visit) (Signed)
   03/13/2024  Name: Cameron Foster. MRN: [id number] DOB: 06/14/1940  Today's TOC FU Call Status: Today's TOC FU Call Status:: Unsuccessful Call (1st Attempt) Unsuccessful Call (1st Attempt) Date: 03/13/24  Attempted to reach the patient regarding the most recent Inpatient/ED visit.  Follow Up Plan: Additional outreach attempts will be made to reach the patient to complete the Transitions of Care (Post Inpatient/ED visit) call.   Cathlean Headland BSN RN [facility] Care Management Coordinator [email or web address] Direct Dial: [phone]  Fax: [phone] Website: [email or web address]

## 2024-03-16 ENCOUNTER — Encounter: Payer: Self-pay | Admitting: Family

## 2024-03-16 ENCOUNTER — Encounter: Payer: Self-pay | Admitting: Internal Medicine

## 2024-03-18 ENCOUNTER — Encounter: Payer: Self-pay | Admitting: Family Medicine

## 2024-03-18 ENCOUNTER — Ambulatory Visit: Admitting: Family Medicine

## 2024-03-18 VITALS — BP 101/61 | HR 67 | Ht 69.0 in | Wt 205.8 lb

## 2024-03-18 DIAGNOSIS — R7303 Prediabetes: Secondary | ICD-10-CM

## 2024-03-18 DIAGNOSIS — I5020 Unspecified systolic (congestive) heart failure: Secondary | ICD-10-CM

## 2024-03-18 DIAGNOSIS — D509 Iron deficiency anemia, unspecified: Secondary | ICD-10-CM

## 2024-03-18 DIAGNOSIS — N179 Acute kidney failure, unspecified: Secondary | ICD-10-CM

## 2024-03-18 DIAGNOSIS — G4733 Obstructive sleep apnea (adult) (pediatric): Secondary | ICD-10-CM

## 2024-03-18 DIAGNOSIS — N1831 Chronic kidney disease, stage 3a: Secondary | ICD-10-CM

## 2024-03-18 DIAGNOSIS — I480 Paroxysmal atrial fibrillation: Secondary | ICD-10-CM

## 2024-03-18 DIAGNOSIS — I3139 Other pericardial effusion (noninflammatory): Secondary | ICD-10-CM

## 2024-03-18 DIAGNOSIS — F321 Major depressive disorder, single episode, moderate: Secondary | ICD-10-CM | POA: Insufficient documentation

## 2024-03-18 DIAGNOSIS — E785 Hyperlipidemia, unspecified: Secondary | ICD-10-CM

## 2024-03-18 NOTE — Progress Notes (Unsigned)
 Established patient visit   Patient: Cameron Foster.   DOB: Nov 23, 1940   [name]  MRN: [id number] Visit Date: 03/18/2024  Today's healthcare provider: Jon Eva, MD   Chief Complaint  Patient presents with  . Medical Management of Chronic Issues  . Hypertension  . Hyperlipidemia  . Hospitalization Follow-up   Subjective    Hypertension  Hyperlipidemia     Discussed the use of AI scribe software for clinical note transcription with the patient, who gave verbal consent to proceed.  History of Present Illness             Medications: Outpatient Medications Prior to Visit  Medication Sig  . albuterol  (VENTOLIN  HFA) 108 (90 Base) MCG/ACT inhaler Inhale 2 puffs into the lungs every 6 (six) hours as needed for wheezing or shortness of breath.  . Alpha-Lipoic Acid 600 MG TABS Take 1 tablet by mouth daily.  . amiodarone  (PACERONE ) 200 MG tablet Take 1 tablet (200 mg total) by mouth daily.  . apixaban  (ELIQUIS ) 2.5 MG TABS tablet Take 1 tablet (2.5 mg total) by mouth 2 (two) times daily.  . atorvastatin  (LIPITOR) 40 MG tablet Take 1 tablet (40 mg total) by mouth daily.  . cetirizine  (ZYRTEC ) 10 MG tablet Take 10 mg by mouth daily.  . Cholecalciferol  (VITAMIN D-3) 25 MCG (1000 UT) CAPS Take 1,000 Units by mouth daily.  . dapagliflozin  propanediol (FARXIGA ) 10 MG TABS tablet Take 1 tablet (10 mg total) by mouth daily.  . dextromethorphan -guaiFENesin  (MUCINEX  DM) 30-600 MG 12hr tablet Take 1 tablet by mouth 2 (two) times daily as needed for cough.  . docusate sodium  (COLACE) 100 MG capsule Take 1 capsule (100 mg total) by mouth 2 (two) times daily.  . fluticasone  (FLONASE ) 50 MCG/ACT nasal spray PLACE 1 SPRAY INTO BOTH NOSTRILS DAILY AS NEEDED FOR ALLERGIES OR RHINITIS.  . folic acid  (FOLVITE ) 1 MG tablet Take 1 tablet (1 mg total) by mouth daily.  . hydrocortisone  2.5 % cream APPLY TO AFFECTED AREA TWICE A DAY AS NEEDED FOR RASH  . ketoconazole  (NIZORAL ) 2 %  cream Apply once or twice daily to affected skin folds as needed for rash  . metoprolol  succinate (TOPROL -XL) 25 MG 24 hr tablet Take 0.5 tablets (12.5 mg total) by mouth daily.  . omeprazole  (PRILOSEC) 20 MG capsule Take 1 capsule (20 mg total) by mouth daily as needed.  . potassium chloride  SA (KLOR-CON  M) 20 MEQ tablet Take 2 tablets (40 mEq total) by mouth daily.  . predniSONE  (DELTASONE ) 20 MG tablet Take 1 tablet (20 mg total) by mouth daily with breakfast.  . sertraline  (ZOLOFT ) 100 MG tablet Take 1 tablet (100 mg total) by mouth daily.  . tamsulosin  (FLOMAX ) 0.4 MG CAPS capsule Take 1 capsule (0.4 mg total) by mouth daily.  . thiamine  (VITAMIN B1) 100 MG tablet Take 100 mg by mouth daily.  . torsemide  (DEMADEX ) 20 MG tablet Take 2 tablets (40 mg total) by mouth daily.  . vitamin B-12 (CYANOCOBALAMIN ) 1000 MCG tablet Take 1,000 mcg by mouth daily.  . zinc  gluconate 50 MG tablet Take 50 mg by mouth daily.   No facility-administered medications prior to visit.    Review of Systems {Insert previous labs (optional):23779} {See past labs  Heme  Chem  Endocrine  Serology  Results Review (optional):1}   Objective    BP 101/61 (BP Location: Left Arm, Patient Position: Sitting, Cuff Size: Normal)   Pulse 67   Ht  5' 9 (1.753 m)   Wt 205 lb 12.8 oz (93.4 kg)   SpO2 98%   BMI 30.39 kg/m  {Insert last BP/Wt (optional):23777}{See vitals history (optional):1}  Physical Exam Vitals reviewed.  Constitutional:      General: He is not in acute distress.    Appearance: Normal appearance. He is not diaphoretic.  HENT:     Head: Normocephalic and atraumatic.  Eyes:     General: No scleral icterus.    Conjunctiva/sclera: Conjunctivae normal.  Cardiovascular:     Rate and Rhythm: Normal rate and regular rhythm.     Heart sounds: Normal heart sounds. No murmur heard. Pulmonary:     Effort: Pulmonary effort is normal. No respiratory distress.     Breath sounds: Normal breath sounds. No  wheezing or rhonchi.  Musculoskeletal:     Cervical back: Neck supple.     Right lower leg: Edema (trace) present.     Left lower leg: Edema (trace) present.  Lymphadenopathy:     Cervical: No cervical adenopathy.  Skin:    General: Skin is warm and dry.     Findings: No rash.  Neurological:     Mental Status: He is alert and oriented to person, place, and time. Mental status is at baseline.  Psychiatric:        Mood and Affect: Mood normal.      No results found for any visits on 03/18/24.  Assessment & Plan     Problem List Items Addressed This Visit       Cardiovascular and Mediastinum   Paroxysmal atrial fibrillation (HCC) - Primary   Pericardial effusion   Heart failure with recovered ejection fraction (HFrecEF) (HCC)     Respiratory   OSA (obstructive sleep apnea)     Genitourinary   Stage 3a chronic kidney disease (HCC)   Relevant Orders   Basic Metabolic Panel (BMET)   Acute kidney injury superimposed on CKD   Relevant Orders   Basic Metabolic Panel (BMET)     Other   Hyperlipidemia LDL goal <70   Prediabetes   Current moderate episode of major depressive disorder without prior episode (HCC)   Other Visit Diagnoses       Iron  deficiency anemia, unspecified iron  deficiency anemia type       Relevant Orders   CBC w/Diff/Platelet                   Return in about 3 months (around 06/16/2024) for chronic disease f/u.      I personally spent a total of 42 minutes in the care of the patient today including preparing to see the patient, getting/reviewing separately obtained history, performing a medically appropriate exam/evaluation, counseling and educating, placing orders, documenting clinical information in the EHR, independently interpreting results, and coordinating care.    Jon Eva, MD  [facility] [phone] (phone) [phone] (fax)  [facility]

## 2024-03-19 ENCOUNTER — Telehealth: Payer: Self-pay

## 2024-03-19 ENCOUNTER — Inpatient Hospital Stay: Admitting: Family Medicine

## 2024-03-19 LAB — CBC WITH DIFFERENTIAL/PLATELET
Basophils Absolute: 0 x10E3/uL (ref 0.0–0.2)
Basos: 0 %
EOS (ABSOLUTE): 0 x10E3/uL (ref 0.0–0.4)
Eos: 0 %
Hematocrit: 32.7 % — ABNORMAL LOW (ref 37.5–51.0)
Hemoglobin: 10.2 g/dL — ABNORMAL LOW (ref 13.0–17.7)
Immature Grans (Abs): 0.1 x10E3/uL (ref 0.0–0.1)
Immature Granulocytes: 1 %
Lymphocytes Absolute: 0.3 x10E3/uL — ABNORMAL LOW (ref 0.7–3.1)
Lymphs: 3 %
MCH: 27.7 pg (ref 26.6–33.0)
MCHC: 31.2 g/dL — ABNORMAL LOW (ref 31.5–35.7)
MCV: 89 fL (ref 79–97)
Monocytes Absolute: 0.4 x10E3/uL (ref 0.1–0.9)
Monocytes: 4 %
Neutrophils Absolute: 10.7 x10E3/uL — ABNORMAL HIGH (ref 1.4–7.0)
Neutrophils: 92 %
Platelets: 283 x10E3/uL (ref 150–450)
RBC: 3.68 x10E6/uL — ABNORMAL LOW (ref 4.14–5.80)
RDW: 14 % (ref 11.6–15.4)
WBC: 11.6 x10E3/uL — ABNORMAL HIGH (ref 3.4–10.8)

## 2024-03-19 LAB — BASIC METABOLIC PANEL WITH GFR
BUN/Creatinine Ratio: 20 (ref 10–24)
BUN: 44 mg/dL — ABNORMAL HIGH (ref 8–27)
CO2: 24 mmol/L (ref 20–29)
Calcium: 9.4 mg/dL (ref 8.6–10.2)
Chloride: 98 mmol/L (ref 96–106)
Creatinine, Ser: 2.22 mg/dL — ABNORMAL HIGH (ref 0.76–1.27)
Glucose: 138 mg/dL — ABNORMAL HIGH (ref 70–99)
Potassium: 4.7 mmol/L (ref 3.5–5.2)
Sodium: 138 mmol/L (ref 134–144)
eGFR: 29 mL/min/1.73 — ABNORMAL LOW (ref >59)

## 2024-03-19 NOTE — Patient Instructions (Addendum)
 Visit Information  Thank you for taking time to visit with me today. Please don't hesitate to contact me if I can be of assistance to you before our next scheduled telephone appointment.  Our next appointment is by telephone on 03/27/24 at 1400  Following is a copy of your care plan:   Goals Addressed             This Visit's Progress    VBCI Transitions of Care (TOC) Care Plan       Problems: 03/19/24  Recent Hospitalization for treatment of Atrial Fibrillation and CHF On going HF management  Goal:  Over the next 30 days, the patient will not experience hospital readmission  Interventions:  Transitions of Care: Doctor Visits  - discussed the importance of doctor visits  AFIB Interventions:   Reviewed importance of adherence to anticoagulant exactly as prescribed Counseled on bleeding risk associated with anticoagulants and importance of self-monitoring for signs/symptoms of bleeding Assessed social determinant of health barriers   Heart Failure Interventions: Assessed need for readable accurate scales in home Advised patient to weigh each morning after emptying bladder Discussed importance of daily weight and advised patient to weigh and record daily Reviewed role of diuretics in prevention of fluid overload and management of heart failure; Discussed the importance of keeping all appointments with provider  Patient Self Care Activities:  Attend all scheduled provider appointments Call pharmacy for medication refills 3-7 days in advance of running out of medications Call provider office for new concerns or questions  Notify RN Care Manager of TOC call rescheduling needs Participate in Transition of Care Program/Attend TOC scheduled calls Take medications as prescribed    Plan:  The care management team will reach out to the patient again over the next 5-10 business days. The patient has been provided with contact information for the care management team and has been  advised to call with any health related questions or concerns.  Discussed and offered 30 day TOC program.  Patient accepted weekly calls and week to week determination of progress.  The patient has been provided with contact information for the care management team and has been advised to call with any health -related questions or concerns.  The patient verbalized understanding with current plan of care.  The patient is directed to their insurance card regarding availability of benefits coverage. Patient states just got off the phone with his insurance company.         Patient verbalizes understanding of instructions and care plan provided today and agrees to view in MyChart. Active MyChart status and patient understanding of how to access instructions and care plan via MyChart confirmed with patient.     The patient has been provided with contact information for the care management team and has been advised to call with any health related questions or concerns.  The care management team will reach out to the patient again over the next 5-10 business days.  The patient will call provider* as advised to any bleeding noted and s/s of HF exacerbation, weight gain, diuretics.   Please call the care guide team at [phone] if you need to cancel or reschedule your appointment.   Please call the Suicide and Crisis Lifeline: 988 call the [place]  National Suicide Prevention Lifeline: 502-386-1733 or TTY: 936-152-2730 TTY 906-481-1566) to talk to a trained counselor call 1-800-273-TALK (toll free, 24 hour hotline) call 911 if you are experiencing a Mental Health or Behavioral Health Crisis or need someone to talk to.  [name], RN, BSN, CCM [facility], [facility] Management Coordinator Direct Dial: [phone]

## 2024-03-19 NOTE — Transitions of Care (Post Inpatient/ED Visit) (Signed)
 03/19/2024  Name: Cameron Foster. MRN: [id number] DOB: October 03, 1940  Today's TOC FU Call Status: Today's TOC FU Call Status:: Successful TOC FU Call Completed TOC FU Call Complete Date: 03/19/24  Patient's Name and Date of Birth confirmed. Name, DOB  Transition Care Management Follow-up Telephone Call Date of Discharge: 03/12/24 Discharge Facility: [facility]) Type of Discharge: Inpatient Admission Primary Inpatient Discharge Diagnosis:: Acute on chronic systolic CHF (congestive heart failure) How have you been since you were released from the hospital?: Better Any questions or concerns?: No  Items Reviewed: Did you receive and understand the discharge instructions provided?: Yes Medications obtained,verified, and reconciled?: Yes (Medications Reviewed) Any new allergies since your discharge?: No Dietary orders reviewed?: Yes Type of Diet Ordered:: low sodium heart healthy Do you have support at home?: Yes People in Home [RPT]: alone Name of Support/Comfort Primary Source: sister, Norvel  Medications Reviewed Today: Medications Reviewed Today     Reviewed by Eilleen Richerd GRADE, RN (Registered Nurse) on 03/19/24 at 1401  Med List Status: <None>   Medication Order Taking? Sig Documenting Provider Last Dose Status Informant  albuterol  (VENTOLIN  HFA) 108 (90 Base) MCG/ACT inhaler [id number]  Inhale 2 puffs into the lungs every 6 (six) hours as needed for wheezing or shortness of breath. Levander Slate, MD  Active Family Member           Med Note GROVER BURNARD GORMAN [place] Feb 29, 2024  6:57 PM) PRN  Alpha-Lipoic Acid 600 MG TABS [id number]  Take 1 tablet by mouth daily. [provider]  Active Family Member  amiodarone  (PACERONE ) 200 MG tablet [id number]  Take 1 tablet (200 mg total) by mouth daily. Zenaida Morene PARAS, MD  Active Family Member  apixaban  (ELIQUIS ) 2.5 MG TABS tablet [id number]  Take 1 tablet (2.5 mg total) by mouth 2 (two) times  daily. Josette Ade, MD  Active   atorvastatin  (LIPITOR) 40 MG tablet [id number]  Take 1 tablet (40 mg total) by mouth daily. Myrla Jon [name], MD  Active Family Member  cetirizine  (ZYRTEC ) 10 MG tablet [id number]  Take 10 mg by mouth daily. [provider]  Active Family Member  Cholecalciferol  (VITAMIN D-3) 25 MCG (1000 UT) CAPS [id number]  Take 1,000 Units by mouth daily. [provider]  Active Family Member  dapagliflozin  propanediol (FARXIGA ) 10 MG TABS tablet [id number]  Take 1 tablet (10 mg total) by mouth daily. Josette Ade, MD  Active   dextromethorphan -guaiFENesin  (MUCINEX  DM) 30-600 MG 12hr tablet [id number]  Take 1 tablet by mouth 2 (two) times daily as needed for cough. [provider]  Active Family Member  docusate sodium  (COLACE) 100 MG capsule [id number]  Take 1 capsule (100 mg total) by mouth 2 (two) times daily. Joshua Lin, PA-C  Active Family Member           Med Note [place], [facility]   Thu Dec 21, 2023  7:30 AM)    fluticasone  (FLONASE ) 50 MCG/ACT nasal spray [id number]  PLACE 1 SPRAY INTO BOTH NOSTRILS DAILY AS NEEDED FOR ALLERGIES OR RHINITIS. Myrla Jon [name], MD  Active Family Member  folic acid  (FOLVITE ) 1 MG tablet [id number]  Take 1 tablet (1 mg total) by mouth daily. Caleen Qualia, MD  Active Family Member  hydrocortisone  2.5 % cream [id number]  APPLY TO AFFECTED AREA TWICE A DAY AS NEEDED FOR RASH Bacigalupo, Jon [name], MD  Active Family Member  ketoconazole  (NIZORAL ) 2 % cream [id number]  Apply once or  twice daily to affected skin folds as needed for rash Jackquline Sawyer, MD  Active Family Member  metoprolol  succinate (TOPROL -XL) 25 MG 24 hr tablet [id number]  Take 0.5 tablets (12.5 mg total) by mouth daily. Josette Ade, MD  Active   omeprazole  (PRILOSEC) 20 MG capsule [id number]  Take 1 capsule (20 mg total) by mouth daily as needed. Myrla Jon [name], MD  Active Family Member  potassium chloride  SA (KLOR-CON  M) 20 MEQ tablet  [id number]  Take 2 tablets (40 mEq total) by mouth daily. Donette Ellouise LABOR, FNP  Active Family Member  predniSONE  (DELTASONE ) 20 MG tablet [id number]  Take 1 tablet (20 mg total) by mouth daily with breakfast. Josette Ade, MD  Active   sertraline  (ZOLOFT ) 100 MG tablet [id number]  Take 1 tablet (100 mg total) by mouth daily. Myrla Jon [name], MD  Active Family Member  tamsulosin  (FLOMAX ) 0.4 MG CAPS capsule [id number]  Take 1 capsule (0.4 mg total) by mouth daily. Josette Ade, MD  Active   thiamine  (VITAMIN B1) 100 MG tablet [id number]  Take 100 mg by mouth daily. [provider]  Active Family Member  torsemide  (DEMADEX ) 20 MG tablet [id number]  Take 2 tablets (40 mg total) by mouth daily. Josette Ade, MD  Active   vitamin B-12 (CYANOCOBALAMIN ) 1000 MCG tablet [id number]  Take 1,000 mcg by mouth daily. [provider]  Active Family Member  zinc  gluconate 50 MG tablet [id number]  Take 50 mg by mouth daily. [provider]  Active Family Member  Med List Note Lesly, Richerd GRADE, RN 02/05/24 1145): Patient states, my sister, Norvel, manages all of my medicines and keeps up with the changes and set it up for me            Home Care and Equipment/Supplies: Were Home Health Services Ordered?: Yes Name of Home Health Agency:: [facility] Has Agency set up a time to come to your home?: Yes First Home Health Visit Date: 03/19/24 Any new equipment or medical supplies ordered?: NA  Functional Questionnaire: Do you need assistance with bathing/showering or dressing?: Yes (set up have aide 7 days a week from 9 am - 1 pm) Do you need assistance with meal preparation?: Yes Do you need assistance with eating?: No Do you have difficulty maintaining continence: No Do you need assistance with getting out of bed/getting out of a chair/moving?: No Do you have difficulty managing or taking your medications?: Yes (My sister Norvel is a retired engineer, civil (consulting) and manages my  medications, we a least talk almost everyday)  Follow up appointments reviewed: PCP Follow-up appointment confirmed?: Yes Date of PCP follow-up appointment?: 03/18/24 Follow-up Provider: Myrla Jon [name], MD Specialist Hospital Follow-up appointment confirmed?: Yes Date of Specialist follow-up appointment?: 03/26/24 Follow-Up Specialty Provider:: Cardiology Do you need transportation to your follow-up appointment?: No Do you understand care options if your condition(s) worsen?: Yes-patient verbalized understanding  SDOH Interventions Today    Flowsheet Row Most Recent Value  SDOH Interventions   Food Insecurity Interventions Intervention Not Indicated  Housing Interventions Intervention Not Indicated  Transportation Interventions Intervention Not Indicated  [sister goes and takes me to appointments she's a retired nurse]  Utilities Interventions Intervention Not Indicated    Goals Addressed             This Visit's Progress    VBCI Transitions of Care (TOC) Care Plan       Problems: 03/19/24  Recent Hospitalization for treatment of Atrial Fibrillation and CHF On going HF management  Goal:  Over the next 30 days, the patient will not experience hospital readmission  Interventions:  Transitions of Care: Doctor Visits  - discussed the importance of doctor visits  AFIB Interventions:   Reviewed importance of adherence to anticoagulant exactly as prescribed Counseled on bleeding risk associated with anticoagulants and importance of self-monitoring for signs/symptoms of bleeding Assessed social determinant of health barriers   Heart Failure Interventions: Assessed need for readable accurate scales in home Advised patient to weigh each morning after emptying bladder Discussed importance of daily weight and advised patient to weigh and record daily Reviewed role of diuretics in prevention of fluid overload and management of heart failure; Discussed the importance of  keeping all appointments with provider  Patient Self Care Activities:  Attend all scheduled provider appointments Call pharmacy for medication refills 3-7 days in advance of running out of medications Call provider office for new concerns or questions  Notify RN Care Manager of TOC call rescheduling needs Participate in Transition of Care Program/Attend TOC scheduled calls Take medications as prescribed    Plan:  The care management team will reach out to the patient again over the next 5-10 business days. The patient has been provided with contact information for the care management team and has been advised to call with any health related questions or concerns.  Discussed and offered 30 day TOC program.  Patient accepted weekly calls and week to week determination of progress.  The patient has been provided with contact information for the care management team and has been advised to call with any health -related questions or concerns.  The patient verbalized understanding with current plan of care.  The patient is directed to their insurance card regarding availability of benefits coverage. Patient states just got off the phone with his insurance company.         [name], RN, BSN, CCM [facility], [facility] Management Coordinator Direct Dial: [phone]

## 2024-03-20 ENCOUNTER — Telehealth: Payer: Self-pay

## 2024-03-20 ENCOUNTER — Encounter: Payer: Self-pay | Admitting: Family Medicine

## 2024-03-20 NOTE — Telephone Encounter (Signed)
 That's fine

## 2024-03-20 NOTE — Telephone Encounter (Signed)
 Copied from CRM #[id number]. Topic: Clinical - Home Health Verbal Orders >> Mar 20, 2024 12:05 PM Nessti S wrote: Caller/Agency: [facility]  Callback Number: [phone] Service Requested: Physical Therapy; home health aide Frequency: pt 1x a wk for 7 wks home health aid 1x a wk for 6 wks Any new concerns about the patient? No

## 2024-03-21 ENCOUNTER — Telehealth: Payer: Self-pay

## 2024-03-21 NOTE — Telephone Encounter (Signed)
 Called and informed [facility] that is okay to proceed with therapy.

## 2024-03-21 NOTE — Telephone Encounter (Signed)
 LVM confirming that it is okay to proceed with patient's physical therapy, if calls back ok for E2C2 to relay the message.

## 2024-03-21 NOTE — Telephone Encounter (Signed)
 Copied from CRM #[id number]. Topic: Clinical - Home Health Verbal Orders >> Mar 21, 2024  3:28 PM Roselie BROCKS wrote: Caller/Agency: [facility]  Callback Number:[phone] Service Requested: Physical Therapy Frequency: verbal orders to add 1 week  Any new concerns about the patient? No

## 2024-03-25 ENCOUNTER — Ambulatory Visit: Payer: Self-pay | Admitting: Family Medicine

## 2024-03-25 ENCOUNTER — Telehealth: Payer: Self-pay | Admitting: Family

## 2024-03-25 NOTE — Progress Notes (Unsigned)
ADVANCED HEART FAILURE FOLLOW UP CLINIC NOTE  Referring Physician: Myrla Jon [name], MD  Primary Care: Myrla Jon [name], MD Primary Cardiologist:  HPI: Cameron Foster. is a 83 y.o. male who presents for follow up of chronic heart failure with preserved EF.      Admitted to [facility] 9/3-9/7 due to chest pain and tachycardia, found to have atrial fibrillation with RVR, rates in the 160s. Cardizem  given and metoprolol  given.    Echo (12/22/23) EF: 55-60%   Admitted to [facility] 9/15 due to chest pain with persistent tachycardia. EKG showed atrial fibrillation. He received IV lasix  and metrprolol. He was Cardioverter on 01/04/24 with symptom improvement.    TEE (01/03/24) EF: 30-35%, LV      SUBJECTIVE:  Patient overall feels pretty miserable today. He is short of breath with minimal exertion, has significant lower extremity swelling, and notes worsening BP over the past few days. He has been taking his medications as prescribed, though recently stopped jardiance  after he noticed that his blood pressures dropped with taking. He is concerned about his overall trajectory, and notes that he currently has very little quality of life. We discussed the natural history of HF, the importance of fluid mobilization, and salt/fluid restrictions.   PMH, current medications, allergies, social history, and family history reviewed in epic.  PHYSICAL EXAM: There were no vitals filed for this visit.  GENERAL: Chronically ill appearing PULM:  Normal work of breathing, clear to auscultation bilaterally. Respirations are unlabored.  CARDIAC:  JVP: moderately elevated         Normal rate with regular rhythm. Systolic murmur, 2+ LE edema.  Warm and well perfused extremities. ABDOMEN: Soft, non-tender, non-distended. NEUROLOGIC: Patient is oriented x3 with no focal or lateralizing neurologic deficits.     ASSESSMENT & PLAN:  1. Heart failure with preserved ejection fraction - NYHA Class III  -  Hypervolemic  - Symptoms of volume overload, concerned about his clinical trajectory -Ejection fraction reduced during TEE, but suspect mainly due to tachyarrhythmia.  Appears to behave more like diastolic heart failure - Transition Lasix  to 40 mg torsemide  twice daily -May eventually need midodrine if blood pressure remains low -Restart Jardiance  hopefully as blood pressure improves -Off losartan , spironolactone -Reduce metoprolol  to 50 mg daily - Has close general cardiology follow-up   2. Persistent atrial fibrillation  - Currently in normal sinus rhythm based on exam - continue eliquis  5mg  BID - Reduce amiodarone  to 200 mg daily     3. Substance abuse  - Alcohol and tobacco use cessation encouraged    4. Hyperlipidemia LDL goal <70 - LDL 12/21/23 was 68  - continue atorvastatin  40mg  daily    5. Hypertension - Hypotensive mainly now based on BP log, medicine reductions as above - saw PCP Lollie) 12/26/23     6. CKD stage 3b - Last creatinine 1.83, diuresis with lab check up as above   7. OSA (obstructive sleep apnea) - Advised to wear CPAP at night   Follow up in 3 weeks with general cardiology, heart failure APP around January.  Does not need to be seen in [place]  I spent 50 minutes caring for this patient today including face to face time, ordering and reviewing labs, reviewing records from his prior hospital visits, long discussion with patient about clinical trajectory, if worsening could consider palliative care consultation, seeing the patient, documenting in the record, and arranging follow ups.   Morene Brownie, MD Advanced Heart Failure Mechanical Circulatory Support  03/25/24 

## 2024-03-25 NOTE — Telephone Encounter (Signed)
 Called to confirm/remind patient of their appointment at the [facility] on 03/26/24.   Appointment:   [x] Confirmed  [] Left mess   [] No answer/No voice mail  [] VM Full/unable to leave message  [] Phone not in service  Patient reminded to bring all medications and/or complete list.  Confirmed patient has transportation. Gave directions, instructed to utilize valet parking.

## 2024-03-26 ENCOUNTER — Ambulatory Visit: Attending: Family | Admitting: Family

## 2024-03-26 ENCOUNTER — Encounter: Payer: Self-pay | Admitting: Physician Assistant

## 2024-03-26 ENCOUNTER — Ambulatory Visit: Attending: Physician Assistant | Admitting: Physician Assistant

## 2024-03-26 ENCOUNTER — Encounter: Payer: Self-pay | Admitting: Family

## 2024-03-26 VITALS — BP 105/65 | HR 70 | Wt 205.6 lb

## 2024-03-26 VITALS — BP 100/56 | HR 68 | Ht 68.0 in | Wt 204.2 lb

## 2024-03-26 DIAGNOSIS — N1831 Chronic kidney disease, stage 3a: Secondary | ICD-10-CM

## 2024-03-26 DIAGNOSIS — I4819 Other persistent atrial fibrillation: Secondary | ICD-10-CM

## 2024-03-26 DIAGNOSIS — G4733 Obstructive sleep apnea (adult) (pediatric): Secondary | ICD-10-CM

## 2024-03-26 DIAGNOSIS — D649 Anemia, unspecified: Secondary | ICD-10-CM

## 2024-03-26 DIAGNOSIS — I5020 Unspecified systolic (congestive) heart failure: Secondary | ICD-10-CM

## 2024-03-26 DIAGNOSIS — E785 Hyperlipidemia, unspecified: Secondary | ICD-10-CM

## 2024-03-26 DIAGNOSIS — I480 Paroxysmal atrial fibrillation: Secondary | ICD-10-CM

## 2024-03-26 DIAGNOSIS — I10 Essential (primary) hypertension: Secondary | ICD-10-CM

## 2024-03-26 DIAGNOSIS — I3139 Other pericardial effusion (noninflammatory): Secondary | ICD-10-CM

## 2024-03-26 DIAGNOSIS — F1090 Alcohol use, unspecified, uncomplicated: Secondary | ICD-10-CM

## 2024-03-26 DIAGNOSIS — I5032 Chronic diastolic (congestive) heart failure: Secondary | ICD-10-CM

## 2024-03-26 NOTE — Patient Instructions (Signed)
 Medication Instructions:  Your physician recommends that you continue on your current medications as directed. Please refer to the Current Medication list given to you today.  *If you need a refill on your cardiac medications before your next appointment, please call your pharmacy*  Lab Work: Your provider would like for you to have following labs drawn today CBC,BMET.   If you have labs (blood work) drawn today and your tests are completely normal, you will receive your results only by: MyChart Message (if you have MyChart) OR A paper copy in the mail If you have any lab test that is abnormal or we need to change your treatment, we will call you to review the results.  Testing/Procedures: Your physician has requested that you have an echocardiogram. Echocardiography is a painless test that uses sound waves to create images of your heart. It provides your doctor with information about the size and shape of your heart and how well your heart's chambers and valves are working.   You may receive an ultrasound enhancing agent through an IV if needed to better visualize your heart during the echo. This procedure takes approximately one hour.  There are no restrictions for this procedure.  This will take place at [address] [facility] [address], [place] [address]  Please note: We ask at that you not bring children with you during ultrasound (echo/ vascular) testing. Due to room size and safety concerns, children are not allowed in the ultrasound rooms during exams. Our front office staff cannot provide observation of children in our lobby area while testing is being conducted. An adult accompanying a patient to their appointment will only be allowed in the ultrasound room at the discretion of the ultrasound technician under special circumstances. We apologize for any inconvenience.   Follow-Up: At [facility], you and your health needs are our priority.  As part of our  continuing mission to provide you with exceptional heart care, our providers are all part of one team.  This team includes your primary Cardiologist (physician) and Advanced Practice Providers or APPs (Physician Assistants and Nurse Practitioners) who all work together to provide you with the care you need, when you need it.  Your next appointment:   2 month(s)  with [facility]  Provider:   Dr Kennyth

## 2024-03-26 NOTE — Patient Instructions (Signed)
 It was good to see you today!  If you receive a satisfaction survey regarding the Heart Failure Clinic, please take the time to fill it out. This way we can continue to provide excellent care and make any changes that need to be made.

## 2024-03-26 NOTE — Progress Notes (Signed)
 Cardiology Office Note    Date:  03/26/2024   ID:  Cameron Foster., DOB [birth year], MRN [id number]  PCP:  Myrla Jon [name], MD  Cardiologist:  Lonni Hanson, MD  Electrophysiologist:  None   Chief Complaint: Hospital follow up  History of Present Illness:   Cameron Foster. is a 83 y.o. male with history of chronic HFmrEF, paroxysmal atrial fibrillation, hypertension, hyperlipidemia, OSA, CVA, subarachnoid hemorrhage, CKD stage IIIa, tobacco use, colitis with GI bleeding, prostate cancer, and alcohol use who presents for hospital follow-up.     Patient was admitted to the hospital 06/2021 with left-sided colitis thought to be ischemic versus infectious.  Flexible sigmoidoscopy showed possible ischemic colitis and he was treated with antibiotics.  This admission was complicated by hypotension and atrial fibrillation with RVR.  Troponin minimally elevated.  BNP 569.  Echo at that time demonstrated EF 45 to 50%, global hypokinesis, moderate concentric LVH, elevated LVEDP, mild LAE, mild MR, and mildly dilated aortic root and ascending aorta measuring 41 and 39 mm respectively.  He was started on IV amiodarone  for rate control which led to pharmacological cardioversion.  He was also started on Eliquis  at that time.  He was seen in the office 07/2021 for hospital follow-up and denied any further GI bleeding.  He expressed concerns for continuing anticoagulation secondary to bleeding problems.  He underwent outpatient cardiac monitoring which showed no evidence of atrial fibrillation and Eliquis  was discontinued.  He was hospitalized from 9/3 to 12/24/2023 with A-fib with RVR requiring Cardizem  infusion. Echo at that time showed LVEF  55-60% without RWMA, mild LVH, diastolic parameters were indeterminate. He was discharged on Eliquis  with plans to perform cardioversion in 3 weeks if persistent A-fib. His stay was complicated by bradycardia and sinus pauses for which initial Cardizem  dose of  240 was decreased to 120 in addition to metoprolol  tartrate 75 mg twice daily.    Patient presented to ED again 01/01/2024 with dull chest pain.  He was found to be in atrial fibrillation with RVR, rate up to 130s bpm.  He underwent TEE guided cardioversion 9/17 with successful conversion to sinus rhythm.  He was started on oral amiodarone  load.  TEE showed EF 30 to 35% with global hypokinesis.  GDMT was initiated with Toprol -XL and losartan .  Patient was discharged 01/04/2024 in stable condition.   He had a follow-up with advanced heart failure clinic in [facility] 01/09/2024 and reported feeling lousy with shortness of breath on minimal exertion.  He reported an episode of about 30 minutes with drowsiness and slurred speech.  EKG showed that he was back in atrial fibrillation.  Weight was up 4 pounds since hospital admission 4 days prior.  He was given Furoscix  1 dose and started on Lasix  40 mg daily.  He was scheduled for DCCV which was completed 10/1 with successful conversion to sinus rhythm.  Lasix  was increased to 80 mg daily.  He was seen again in follow-up with the advanced heart failure clinic in [facility] 10/7 and reported ongoing dyspnea with associated fatigue, edema, cough, and generalized weakness. He was maintaining sinus rhythm per EKG.  Weight was stable.  He was referred to pulmonology for OSA due to difficulty with CPAP.  Follow-up labs with creatinine noted to be elevated.  Losartan  was subsequently discontinued.   Patient was most recently seen by myself 02/02/2024 for hospital follow up.  He reported improvement in exertional dyspnea since most recent cardioversion, although he still required assistance  with ADLs due to dyspnea.  He endorsed ongoing lower extremity swelling, abdominal fullness, and orthopnea.  He believes his dry weight was near 205 pounds.  He did not feel that furosemide  caused him to urinate any more than if he was not taking it.  He had started wearing his CPAP again  with some improvement in sleep quality.  He was transitioned from Lasix  to torsemide  40 mg daily and started on SGLT2 inhibitor.  He followed up with Dr. Zenaida at the advanced heart failure clinic and reported feeling pretty miserable.  He reported shortness of breath with minimal exertion.  Significant lower extremity swelling, and worsening blood pressure for the past few days.  He had recently stopped taking Jardiance  after he noticed his blood pressures dropped after taking it.  Torsemide  was increased to 40 mg twice daily and he was recommended to resume Jardiance .  Metoprolol  was reduced to 50 mg daily.  He was seen in follow-up with EP 02/29/2024 and reported ongoing concern for low blood pressure for which she had been holding some of his medications intermittently, including torsemide .  He was noted to be volume overloaded and it was recommended that he proceed to the ED for further management of symptoms.  He was admitted 03/01/2024 with proBNP elevated at 1845.  Creatinine was 2.37, up from baseline around 1.3-1.8.  He globin was noted to be downtrending at 8.2.  Chest x-ray was without acute cardiopulmonary disease.  EKG showed sinus rhythm with RBBB and first-degree AV block with nonspecific ST/T wave abnormalities.  Echo showed EF improved at 60 to 65% with moderate pericardial effusion, no evidence of tamponade.  Eliquis  was held due to concern for possible hemopericardium.  Repeat limited echo showed stable small to moderate-sized pericardial effusion without evidence of tamponade.  GDMT was held given low blood pressure and he was aggressively diuresed with net -6.5 L.  Kidney function improved and he was resumed on metoprolol  succinate 12.5 mg daily, Farxiga  10 mg daily, and oral torsemide  40 mg daily.  Received blood transfusion on 11/19.  Repeat limited echo 11/22 showed stable moderate pericardial effusion with no evidence of tamponade.  Given IV iron  on 11/24.  Eliquis  was resumed prior  to discharge on 03/12/2024.  Patient presents today overall doing much better from a cardiac perspective. He had an appointment prior to this one and did a lot of walking before coming today. He denies any dyspnea on exertion but does feel somewhat swimmy headed. He thinks this may be related to his sinuses. His weights and BP have been stable at home. He has some lightheadedness when changing positions. He denies chest pain, shortness of breath, palpitations, and lower extremity swelling.   Labs independently reviewed: 03/18/2024-Hgb 10.2, HCT 32.7, platelets 283, BUN 44, creatinine 2.22, sodium 138, potassium 4.7 12/2023- TC 154, TG 167, HDL 53, LDL 68   Objective   Past Medical History:  Diagnosis Date   Alcohol dependence (HCC) 09/30/2021   Alcoholism (HCC)    Allergy    Aortic atherosclerosis    Arthritis    Ascending aorta dilation 03/28/2018   a.) Vascular US : prox asc Ao measured 29 mm. b.)  CT CAP 06/22/2021: Ao root 41 mm. c.) TTE 06/26/2021: Ao root 41 mm; asc Ao 39 mm   Atrial fibrillation (HCC) 1995   a.) single episode in 1995 per patient; no long term treatment. b.) recurrent episode in the setting of GI bleeding related to colitis 06/2021.   Basal cell carcinoma  04/26/2017   Right medial cheek. Superficial and nodular   Basal cell carcinoma 06/11/2019   Left anterior shoulder. Nodular pattern   Basal cell carcinoma 09/09/2019   Right nasal ala, [facility]   Basal cell carcinoma 02/05/2020   L upper eyebrow, [facility]    Basal cell carcinoma 10/12/2022   left post auricular, [facility]   Basal cell carcinoma 05/02/2023   Right medial cheek, [facility]   Bilateral carotid artery stenosis 06/09/2019   a.) Carotid doppler: mod; <50% BILATERAL ICAs.   Cataract    CHF (congestive heart failure) (HCC)    Chicken pox    Colon cancer (HCC)    Colon polyp    Depression    Son died 04-25-2015   Diverticulitis    Diverticulosis 30 years   Encounter for other preprocedural examination 06/21/2021    Gastritis    GERD (gastroesophageal reflux disease)    H. pylori infection    History of stress test    a. 09/2015 MV: No ischemia/infarct. EF 45-54% (nl by echo).   Hyperlipidemia    Hypertension    Irritable bowel syndrome    Lacunar infarction (HCC) 06/08/2019   a.) small; RIGHT motor strip   Myocardial infarction [facility])    Neuromuscular disorder (HCC)    NSVT (nonsustained ventricular tachycardia) (HCC)    a.)  Single episode lasting 5 beats at a maximum rate of 160 bpm noted on Holter study performed 08/13/2021.   Prostate cancer (HCC) Apr 25, 2011   a.) s/p XRT   PSVT (paroxysmal supraventricular tachycardia)    a. 06/2019 Zio: Avg rate 74 (54-120), occas PACs, rare PVCs, 125 episodes of PVCs (longest 17.5 secs; max rate 187). No afib.   RBBB    SAH (subarachnoid hemorrhage) (HCC) 10/10/2018   Sleep apnea    Squamous cell carcinoma of skin 07/18/2017   Left medial calf. KA type   Squamous cell carcinoma of skin 04/24/2018   Right above med. brow   Squamous cell carcinoma of skin 06/11/2019   Right posterior shoulder. SCCis, hypertrophic   Squamous cell carcinoma of skin 01/09/2020   Mid nasal dorsum, MOHS, Efudex x 4wks   Stroke [facility])    Substance abuse (HCC)    Systolic dysfunction    a.) TTE 10/05/2015: EF 50-55%; mild LVH; LAE; triv AR, mild MR. b.) TTE 03/29/2018: EF 55-60%; LAE, mild AR; ? small PFO. c.) TTE 06/09/2019: EF 55-60%, no rwma, triv MR/AI. d.) TTE 06/26/2021: EF 45-50%; glob HK; LAE; mild MR; Ao root 41 mm; asc Ao 39 mm.   Vasovagal syncope     Current Medications: Current Meds  Medication Sig   albuterol  (VENTOLIN  HFA) 108 (90 Base) MCG/ACT inhaler Inhale 2 puffs into the lungs every 6 (six) hours as needed for wheezing or shortness of breath.   Alpha-Lipoic Acid 600 MG TABS Take 1 tablet by mouth daily.   amiodarone  (PACERONE ) 200 MG tablet Take 1 tablet (200 mg total) by mouth daily.   apixaban  (ELIQUIS ) 2.5 MG TABS tablet Take 1 tablet (2.5 mg total) by mouth  2 (two) times daily.   atorvastatin  (LIPITOR) 40 MG tablet Take 1 tablet (40 mg total) by mouth daily.   cetirizine  (ZYRTEC ) 10 MG tablet Take 10 mg by mouth daily.   Cholecalciferol  (VITAMIN D-3) 25 MCG (1000 UT) CAPS Take 1,000 Units by mouth daily.   dapagliflozin  propanediol (FARXIGA ) 10 MG TABS tablet Take 1 tablet (10 mg total) by mouth daily.   dextromethorphan -guaiFENesin  (MUCINEX  DM) 30-600 MG 12hr tablet  Take 1 tablet by mouth 2 (two) times daily as needed for cough.   docusate sodium  (COLACE) 100 MG capsule Take 1 capsule (100 mg total) by mouth 2 (two) times daily.   fluticasone  (FLONASE ) 50 MCG/ACT nasal spray PLACE 1 SPRAY INTO BOTH NOSTRILS DAILY AS NEEDED FOR ALLERGIES OR RHINITIS.   folic acid  (FOLVITE ) 1 MG tablet Take 1 tablet (1 mg total) by mouth daily.   hydrocortisone  2.5 % cream APPLY TO AFFECTED AREA TWICE A DAY AS NEEDED FOR RASH   ketoconazole  (NIZORAL ) 2 % cream Apply once or twice daily to affected skin folds as needed for rash   metoprolol  succinate (TOPROL -XL) 25 MG 24 hr tablet Take 0.5 tablets (12.5 mg total) by mouth daily.   omeprazole  (PRILOSEC) 20 MG capsule Take 1 capsule (20 mg total) by mouth daily as needed.   potassium chloride  SA (KLOR-CON  M) 20 MEQ tablet Take 2 tablets (40 mEq total) by mouth daily.   predniSONE  (DELTASONE ) 20 MG tablet Take 1 tablet (20 mg total) by mouth daily with breakfast.   sertraline  (ZOLOFT ) 100 MG tablet Take 1 tablet (100 mg total) by mouth daily.   tamsulosin  (FLOMAX ) 0.4 MG CAPS capsule Take 1 capsule (0.4 mg total) by mouth daily.   thiamine  (VITAMIN B1) 100 MG tablet Take 100 mg by mouth daily.   torsemide  (DEMADEX ) 20 MG tablet Take 2 tablets (40 mg total) by mouth daily.   vitamin B-12 (CYANOCOBALAMIN ) 1000 MCG tablet Take 1,000 mcg by mouth daily.   zinc  gluconate 50 MG tablet Take 50 mg by mouth daily.    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  Wine, Beer or Liquor.   Drug use: No   Sexual activity: Not Currently    Birth control/protection: None    Comment: Married  Other Topics Concern   Not on file  Social History Narrative   1 son deceased, 1 daughter living   Social Drivers of Corporate Investment Banker Strain: Low Risk  (01/28/2024)   Overall Financial Resource Strain (CARDIA)    Difficulty of Paying Living Expenses: Not very hard  Food Insecurity: No Food Insecurity (03/19/2024)   Hunger Vital Sign    Worried About Running Out of Food in the Last Year: Never true    Ran Out of Food in the Last Year: Never true  Transportation Needs: No Transportation Needs (03/19/2024)   PRAPARE - Administrator, Civil Service (Medical): No    Lack of Transportation (Non-Medical):  No  Physical Activity: Inactive (01/28/2024)   Exercise Vital Sign    Days of Exercise per Week: 0 days    Minutes of Exercise per Session: Not on file  Stress: No Stress Concern Present (01/28/2024)   [employer] of Occupational Health - Occupational Stress Questionnaire    Feeling of Stress: Only a little  Recent Concern: Stress - Stress Concern Present (11/24/2023)   [employer] of Occupational Health - Occupational Stress Questionnaire    Feeling of Stress: To some extent  Social Connections: Moderately Integrated (03/01/2024)   Social Connection and Isolation Panel    Frequency of Communication with Friends and Family: Three times a week    Frequency of Social Gatherings with Friends and Family: Twice a week    Attends Religious Services: More than 4 times per year    Active Member of [employer] or Organizations: Yes    Attends Banker Meetings: More than 4 times per year    Marital Status: Widowed     Family History:  The patient's family history includes Bipolar disorder in his son; Cancer in his father; Early death in his son; Heart disease in his son; Kidney disease in his daughter; Learning disabilities in his son; Lung cancer in his father; Other in his mother; Sudden death in his son; Varicose Veins in his sister; Vision loss in his mother. There is no history of Prostate cancer or Bladder Cancer.  ROS:   12-point review of systems is negative unless otherwise noted in the HPI.  EKGs/Other Studies Reviewed:    Studies reviewed were summarized above. The additional studies were reviewed today:  03/09/2024 Echo limited 1. Left ventricular ejection fraction, by estimation, is 55 to 60%. The  left ventricle has normal function. There is moderate left ventricular  hypertrophy.   2. Right ventricular systolic function is normal. The right ventricular  size is normal.   3. Moderate pericardial effusion. There is no evidence of cardiac  tamponade. No RV diastolic collapse or significant mitral/tricuspid inflow  variation present.   4. The inferior vena cava is dilated in size with <50% respiratory  variability, suggesting right atrial pressure of 15 mmHg.   03/03/2024 Echo limited 1. Left ventricular ejection fraction, by estimation, is 60 to 65%. The  left ventricle has normal function. The left ventricle has no regional  wall motion  abnormalities. There is mild concentric left ventricular  hypertrophy.   2. Right ventricular systolic function is normal.   3. Pericardial effusion is unchanged in size from 03/01/24. There is  hyperechoic densities anterior to the right ventricle unchanged from prior  concerning for thrombus or fibrinous material. No evidence of tamponade.  Moderate pericardial effusion. The  pericardial effusion is circumferential. There is no evidence of cardiac  tamponade.   4. The inferior vena cava is normal in size with greater than 50%  respiratory variability, suggesting right atrial pressure of 3 mmHg.   03/01/2024 Echo limited 1. Left ventricular ejection fraction, by estimation, is 60 to 65%. The  left ventricle has normal function. Left ventricular endocardial border  not optimally defined to evaluate regional wall motion. There is moderate  left ventricular hypertrophy. Left  ventricular diastolic parameters are indeterminate.   2. Right ventricular systolic function is normal. The right ventricular  size is normal. Tricuspid regurgitation signal is inadequate for assessing  PA pressure.   3. Moderate pericardial effusion. The pericardial effusion is  circumferential. No RV diastolic collapse visualized. Mitral and tricuspid  inflow not performed.   4. The inferior vena cava is dilated in size with <50% respiratory  variability, suggesting right atrial pressure of 15 mmHg.   12/2023 TEE 1. Left ventricular ejection fraction, by estimation, is 30 to 35%. The  left ventricle has moderately decreased function. The left ventricle  demonstrates global hypokinesis.   2. Right ventricular systolic function is normal. The right ventricular  size is normal.   3. Left atrial size was severely dilated. No left atrial/left atrial  appendage thrombus was detected.   4. The mitral valve is normal in structure. Mild mitral valve  regurgitation. No evidence of mitral stenosis.   5. The aortic valve  is tricuspid. Aortic valve regurgitation is not  visualized. No aortic stenosis is present.   6. There is mild (Grade II) atheroma plaque involving the aortic arch and  descending aorta.   7. The inferior vena cava is normal in size with greater than 50%  respiratory variability, suggesting right atrial pressure of 3 mmHg.   8. Agitated saline contrast bubble study was positive with shunting  observed within 3-6 cardiac cycles suggestive of interatrial shunt. There  is a small patent foramen ovale with predominantly left to right shunting  across the atrial septum.   EKG:  EKG personally reviewed by me today EKG Interpretation Date/Time:  Tuesday March 26 2024 10:49:22 EST Ventricular Rate:  68 PR Interval:  214 QRS Duration:  138 QT Interval:  464 QTC Calculation: 493 R Axis:   -60  Text Interpretation: Normal sinus rhythm with sinus arrhythmia (RBBB and left anterior fascicular block) Nonspecific T wave abnormality Confirmed by Lorene Sinclair ([id number]) on 03/26/2024 10:53:24 AM  PHYSICAL EXAM:    VS:  BP (!) 100/56 (BP Location: Left Arm, Patient Position: Sitting, Cuff Size: Normal)   Pulse 68   Ht 5' 8 (1.727 m)   Wt 204 lb 3.2 oz (92.6 kg)   SpO2 96%   BMI 31.05 kg/m   BMI: Body mass index is 31.05 kg/m.  GEN: Well nourished, well developed in no acute distress NECK: No JVD; No carotid bruits CARDIAC: RRR, no murmurs, rubs, gallops RESPIRATORY:  Clear to auscultation without rales, wheezing or rhonchi  ABDOMEN: Soft, non-tender, non-distended EXTREMITIES: Trace LE edema; No deformity  Wt Readings from Last 3 Encounters:  03/26/24 204 lb 3.2 oz (92.6 kg)  03/26/24 205 lb 9.6 oz (93.3 kg)  03/19/24 197 lb (89.4 kg)                  ASSESSMENT & PLAN:   Chronic HFimpEF - Recent hospitalization for heart failure exacerbation. He was aggressively diuresed with net -6.5 L output. Echo showed improvement in EF from 30-35% to 55-60%. BP was hypotensive which  limited GDMT. Cr was 1.92 on discharge 11/25 and 2.2 on recheck with PCP 12/1. He appears euvolemic on exam today. Will repeat BMP today. Continue Farxiga  10 mg daily, metoprolol  succinate 12.5 mg daily, torsemide  40 mg daily, and potassium 40 mEq daily.   Pericardial effusion - Moderate and stable with multiple echocardiograms throughout hospitalization without evidence of tamponade. He is hemodynamically stable. Recommend repeat limited echo.   Persistent atrial fibrillation - S/p DCCV on 9/17 and again 10/1. Maintaining sinus rhythm today. Eliquis  was held briefly during hospitalization due to anemia, resumed prior to discharge. Continue amiodarone  200 mg daily, metoprolol  succinate 12.5 mg daily, and Eliquis  2.5 mg twice daily (age and kidney function). Refer to EP to discuss candidacy for Watchman device.  CKD IIIa - Cr 1.92 on hospital discharge 11/25 and 2.2 on recheck with PCP 12/1. Will repeat BMP today.   Anemia Hx GI bleed - Hgb down to 7.3 during hospitalization. Given 1 unit PRBCs and IV iron . Hgb 10.3 on repeat with PCP 12.1. Denies bleeding. Repeat CBC today. Refer to EP as above to discuss watchman.  Hyperlipidemia - Most recent lipid panel 12/2023 with LDL 68. Continue atorvastatin  40 mg daily.   OSA - Compliant with CPAP.     Disposition: Check CBC and BMP. Refer to EP. Repeat limited echo. F/u with Dr. Mady or an APP in 2 months.   Medication Adjustments/Labs and Tests Ordered: Current medicines are reviewed at length with the patient today.  Concerns regarding medicines are outlined above. Medication changes, Labs and Tests ordered today are summarized above and listed in the Patient Instructions accessible in Encounters.   Bonney Lesley Maffucci, PA-C 03/26/2024 12:44 PM     [facility] [place] [address] [place], [place] [address] [phone]

## 2024-03-26 NOTE — Progress Notes (Signed)
 Referral sent to [facility] with OV note, demos, labs

## 2024-03-27 ENCOUNTER — Telehealth: Payer: Self-pay

## 2024-03-27 ENCOUNTER — Other Ambulatory Visit: Payer: Self-pay

## 2024-03-27 ENCOUNTER — Ambulatory Visit: Payer: Self-pay | Admitting: Physician Assistant

## 2024-03-27 LAB — CBC
Hematocrit: 32.9 % — ABNORMAL LOW (ref 37.5–51.0)
Hemoglobin: 10.1 g/dL — ABNORMAL LOW (ref 13.0–17.7)
MCH: 27.6 pg (ref 26.6–33.0)
MCHC: 30.7 g/dL — ABNORMAL LOW (ref 31.5–35.7)
MCV: 90 fL (ref 79–97)
Platelets: 214 x10E3/uL (ref 150–450)
RBC: 3.66 x10E6/uL — ABNORMAL LOW (ref 4.14–5.80)
RDW: 14.5 % (ref 11.6–15.4)
WBC: 11.2 x10E3/uL — ABNORMAL HIGH (ref 3.4–10.8)

## 2024-03-27 LAB — BASIC METABOLIC PANEL WITH GFR
BUN/Creatinine Ratio: 17 (ref 10–24)
BUN: 37 mg/dL — ABNORMAL HIGH (ref 8–27)
CO2: 24 mmol/L (ref 20–29)
Calcium: 9.2 mg/dL (ref 8.6–10.2)
Chloride: 102 mmol/L (ref 96–106)
Creatinine, Ser: 2.12 mg/dL — ABNORMAL HIGH (ref 0.76–1.27)
Glucose: 86 mg/dL (ref 70–99)
Potassium: 4.5 mmol/L (ref 3.5–5.2)
Sodium: 142 mmol/L (ref 134–144)
eGFR: 30 mL/min/1.73 — ABNORMAL LOW (ref >59)

## 2024-03-27 NOTE — Patient Instructions (Addendum)
 Visit Information  Thank you for taking time to visit with me today. Please don't hesitate to contact me if I can be of assistance to you before our next scheduled telephone appointment.  Our next appointment is by telephone on 04/03/24 at 1400  Following is a copy of your care plan:   Goals Addressed             This Visit's Progress    VBCI Transitions of Care (TOC) Care Plan   On track    Problems: 03/19/24  Recent Hospitalization for treatment of Atrial Fibrillation and CHF On going HF management 03/27/24 Ongoing with diet, medication management (no desire to know his medications as far as having to managing them, sister refills medication boxes and reviews at MD appointment) and weighing daily. Noted:  Referral sent to [facility] with OV note, demos, labs yesterday per notes      Goal:  Over the next 30 days, the patient will not experience hospital readmission  Interventions:  Transitions of Care: Doctor Visits  - discussed the importance of doctor visits Reviewed appointments and medications, patient states no changes and was reviewed with his sister at MD appointment 03/26/24, reviewed medications for heart and diuretics, inhalers, and blood pressure.     AFIB Interventions:   Reviewed importance of adherence to anticoagulant exactly as prescribed Counseled on bleeding risk associated with anticoagulants and importance of self-monitoring for signs/symptoms of bleeding Assessed social determinant of health barriers  Heart Failure Interventions: Assessed need for readable accurate scales in home Advised patient to weigh each morning after emptying bladder Discussed importance of daily weight and advised patient to weigh and record daily Reviewed role of diuretics in prevention of fluid overload and management of heart failure; Discussed the importance of keeping all appointments with provider  Patient Self Care Activities:  Attend all scheduled provider  appointments Call pharmacy for medication refills 3-7 days in advance of running out of medications Call provider office for new concerns or questions  Notify RN Care Manager of TOC call rescheduling needs Participate in Transition of Care Program/Attend TOC scheduled calls Take medications as prescribed   Patient has paid caregivers from 9 am - 1 pm daily  Plan:  The care management team will reach out to the patient again over the next 5-10 business days. The patient has been provided with contact information for the care management team and has been advised to call with any health related questions or concerns.  Discussed and offered 30 day TOC program.  Patient accepted weekly calls and week to week determination of progress.  The patient has been provided with contact information for the care management team and has been advised to call with any health -related questions or concerns.  The patient verbalized understanding with current plan of care.  The patient is directed to their insurance card regarding availability of benefits coverage. Patient states just got off the phone with his insurance company. 03/27/24 Reviewed plan of care and his needs, patient feels he is progressing well this time post hospital. Will follow up in 1 week phone call.        Patient verbalizes understanding of instructions and care plan provided today and agrees to view in MyChart. Active MyChart status and patient understanding of how to access instructions and care plan via MyChart confirmed with patient.   Patient states his sister will keep up with his information needed.  Telephone follow up appointment with care management team member scheduled  for: The patient has been provided with contact information for the care management team and has been advised to call with any health related questions or concerns.   Please call the care guide team at [phone] if you need to cancel or reschedule your  appointment.   Please call the [place]  National Suicide Prevention Lifeline: (608) 054-2269 or TTY: 930-237-3498 TTY (510)425-9095) to talk to a trained counselor if you are experiencing a Mental Health or Behavioral Health Crisis or need someone to talk to.  [name], RN, BSN, CCM [facility], [facility] Management Coordinator Direct Dial: [phone]

## 2024-03-27 NOTE — Transitions of Care (Post Inpatient/ED Visit) (Signed)
 Transition of Care week 2  Visit Note  03/27/2024  Name: Cameron Foster. MRN: [id number]          DOB: [birth year]  Situation: Patient enrolled in [facility] 30-day program. Visit completed with patient by telephone.   Background:   Initial Transition Care Management Follow-up Telephone Call Discharge Date and Diagnosis: 03/12/24, Acute on chronic systolic CHF (congestive heart failure)   Past Medical History:  Diagnosis Date   Alcohol dependence (HCC) 09/30/2021   Alcoholism (HCC)    Allergy    Aortic atherosclerosis    Arthritis    Ascending aorta dilation 03/28/2018   a.) Vascular US : prox asc Ao measured 29 mm. b.)  CT CAP 06/22/2021: Ao root 41 mm. c.) TTE 06/26/2021: Ao root 41 mm; asc Ao 39 mm   Atrial fibrillation (HCC) 1995   a.) single episode in 1995 per patient; no long term treatment. b.) recurrent episode in the setting of GI bleeding related to colitis 06/2021.   Basal cell carcinoma 04/26/2017   Right medial cheek. Superficial and nodular   Basal cell carcinoma 06/11/2019   Left anterior shoulder. Nodular pattern   Basal cell carcinoma 09/09/2019   Right nasal ala, ED[facility]   Basal cell carcinoma 02/05/2020   L upper eyebrow, EDC    Basal cell carcinoma 10/12/2022   left post auricular, EDC   Basal cell carcinoma 05/02/2023   Right medial cheek, EDC   Bilateral carotid artery stenosis 06/09/2019   a.) Carotid doppler: mod; <50% BILATERAL ICAs.   Cataract    CHF (congestive heart failure) (HCC)    Chicken pox    Colon cancer (HCC)    Colon polyp    Depression    Son died 04/30/15   Diverticulitis    Diverticulosis 30 years   Encounter for other preprocedural examination 06/21/2021   Gastritis    GERD (gastroesophageal reflux disease)    H. pylori infection    History of stress test    a. 09/2015 MV: No ischemia/infarct. EF 45-54% (nl by echo).   Hyperlipidemia    Hypertension    Irritable bowel syndrome    Lacunar infarction (HCC) 06/08/2019   a.) small;  RIGHT motor strip   Myocardial infarction [facility])    Neuromuscular disorder (HCC)    NSVT (nonsustained ventricular tachycardia) (HCC)    a.)  Single episode lasting 5 beats at a maximum rate of 160 bpm noted on Holter study performed 08/13/2021.   Prostate cancer (HCC) 2011/04/30   a.) s/p XRT   PSVT (paroxysmal supraventricular tachycardia)    a. 06/2019 Zio: Avg rate 74 (54-120), occas PACs, rare PVCs, 125 episodes of PVCs (longest 17.5 secs; max rate 187). No afib.   RBBB    SAH (subarachnoid hemorrhage) (HCC) 10/10/2018   Sleep apnea    Squamous cell carcinoma of skin 07/18/2017   Left medial calf. KA type   Squamous cell carcinoma of skin 04/24/2018   Right above med. brow   Squamous cell carcinoma of skin 06/11/2019   Right posterior shoulder. SCCis, hypertrophic   Squamous cell carcinoma of skin 01/09/2020   Mid nasal dorsum, MOHS, Efudex x 4wks   Stroke [facility])    Substance abuse (HCC)    Systolic dysfunction    a.) TTE 10/05/2015: EF 50-55%; mild LVH; LAE; triv AR, mild MR. b.) TTE 03/29/2018: EF 55-60%; LAE, mild AR; ? small PFO. c.) TTE 06/09/2019: EF 55-60%, no rwma, triv MR/AI. d.) TTE 06/26/2021: EF 45-50%; glob HK; LAE; mild  MR; Ao root 41 mm; asc Ao 39 mm.   Vasovagal syncope     Assessment: Patient Reported Symptoms: Cognitive Cognitive Status: No symptoms reported, Able to follow simple commands, Alert and oriented to person, place, and time, Normal speech and language skills      Neurological Neurological Review of Symptoms: Other: [place] Neurological Symptoms/Conditions [RPT]: when getting up and tired out and got woozy Neurological Management Strategies: Routine screening, Medication therapy Neurological Self-Management Outcome: 4 (good)  HEENT HEENT Symptoms Reported: No symptoms reported      Cardiovascular Cardiovascular Symptoms Reported: No symptoms reported (had a woozing spell yesterday after two appointments) Weight: 199 lb (90.3 kg) Cardiovascular  Self-Management Outcome: 4 (good)  Respiratory Respiratory Symptoms Reported: No symptoms reported    Endocrine Endocrine Symptoms Reported: No symptoms reported    Gastrointestinal Gastrointestinal Symptoms Reported: No symptoms reported      Genitourinary Genitourinary Symptoms Reported: No symptoms reported, Frequency Additional Genitourinary Details: diuretics: noted Referral sent to [facility] with OV note, demos, labs from MD appointment yesterday Genitourinary Management Strategies: Fluid modification, Medication therapy  Integumentary Integumentary Symptoms Reported: No symptoms reported    Musculoskeletal Musculoskelatal Symptoms Reviewed: No symptoms reported Additional Musculoskeletal Details: walker        Psychosocial Psychosocial Symptoms Reported: No symptoms reported         Today's Vitals   03/27/24 1404  BP: 120/78  Pulse: 70  Weight: 199 lb (90.3 kg)   Pain Scale: 0-10 Pain Score: 0-No pain  Medications Reviewed Today     Reviewed by Eilleen Richerd GRADE, RN (Registered Nurse) on 03/27/24 at 1404  Med List Status: <None>   Medication Order Taking? Sig Documenting Provider Last Dose Status Informant  albuterol  (VENTOLIN  HFA) 108 (90 Base) MCG/ACT inhaler [id number]  Inhale 2 puffs into the lungs every 6 (six) hours as needed for wheezing or shortness of breath. Levander Slate, MD  Active Family Member           Med Note GROVER BURNARD GORMAN [place] Feb 29, 2024  6:57 PM) PRN  Alpha-Lipoic Acid 600 MG TABS [id number]  Take 1 tablet by mouth daily. [provider]  Active Family Member  amiodarone  (PACERONE ) 200 MG tablet [id number]  Take 1 tablet (200 mg total) by mouth daily. Zenaida Morene PARAS, MD  Active Family Member  apixaban  (ELIQUIS ) 2.5 MG TABS tablet [id number]  Take 1 tablet (2.5 mg total) by mouth 2 (two) times daily. Josette Ade, MD  Active   atorvastatin  (LIPITOR) 40 MG tablet [id number]  Take 1 tablet (40 mg total) by mouth daily.  Myrla Jon [name], MD  Active Family Member  cetirizine  (ZYRTEC ) 10 MG tablet [id number]  Take 10 mg by mouth daily. [provider]  Active Family Member  Cholecalciferol  (VITAMIN D-3) 25 MCG (1000 UT) CAPS [id number]  Take 1,000 Units by mouth daily. [provider]  Active Family Member  dapagliflozin  propanediol (FARXIGA ) 10 MG TABS tablet [id number]  Take 1 tablet (10 mg total) by mouth daily. Josette Ade, MD  Active   dextromethorphan -guaiFENesin  (MUCINEX  DM) 30-600 MG 12hr tablet [id number]  Take 1 tablet by mouth 2 (two) times daily as needed for cough. [provider]  Active Family Member  docusate sodium  (COLACE) 100 MG capsule [id number]  Take 1 capsule (100 mg total) by mouth 2 (two) times daily. Joshua Lin, PA-C  Active Family Member           Med Note [place], [place]  Thu Dec 21, 2023  7:30 AM)    fluticasone  (FLONASE ) 50 MCG/ACT nasal spray [id number]  PLACE 1 SPRAY INTO BOTH NOSTRILS DAILY AS NEEDED FOR ALLERGIES OR RHINITIS. Myrla Jon [name], MD  Active Family Member  folic acid  (FOLVITE ) 1 MG tablet [id number]  Take 1 tablet (1 mg total) by mouth daily. Caleen Qualia, MD  Active Family Member  hydrocortisone  2.5 % cream [id number]  APPLY TO AFFECTED AREA TWICE A DAY AS NEEDED FOR RASH Bacigalupo, Jon [name], MD  Active Family Member  ketoconazole  (NIZORAL ) 2 % cream [id number]  Apply once or twice daily to affected skin folds as needed for rash Jackquline Sawyer, MD  Active Family Member  metoprolol  succinate (TOPROL -XL) 25 MG 24 hr tablet [id number]  Take 0.5 tablets (12.5 mg total) by mouth daily. Josette Ade, MD  Active   omeprazole  (PRILOSEC) 20 MG capsule [id number]  Take 1 capsule (20 mg total) by mouth daily as needed. Myrla Jon [name], MD  Active Family Member  potassium chloride  SA (KLOR-CON  M) 20 MEQ tablet [id number]  Take 2 tablets (40 mEq total) by mouth daily. Donette Ellouise LABOR, FNP  Active Family Member  predniSONE  (DELTASONE ) 20  MG tablet [id number]  Take 1 tablet (20 mg total) by mouth daily with breakfast. Josette Ade, MD  Active   sertraline  (ZOLOFT ) 100 MG tablet [id number]  Take 1 tablet (100 mg total) by mouth daily. Myrla Jon [name], MD  Active Family Member  tamsulosin  (FLOMAX ) 0.4 MG CAPS capsule [id number]  Take 1 capsule (0.4 mg total) by mouth daily. Josette Ade, MD  Active   thiamine  (VITAMIN B1) 100 MG tablet [id number]  Take 100 mg by mouth daily. [provider]  Active Family Member  torsemide  (DEMADEX ) 20 MG tablet [id number]  Take 2 tablets (40 mg total) by mouth daily. Josette Ade, MD  Active   vitamin B-12 (CYANOCOBALAMIN ) 1000 MCG tablet [id number]  Take 1,000 mcg by mouth daily. [provider]  Active Family Member  zinc  gluconate 50 MG tablet [id number]  Take 50 mg by mouth daily. [provider]  Active Family Member  Med List Note Lesly, Richerd GRADE, [place] 03/19/24 1402): 12/2 Patient states, my sister, Norvel, manages all of my medicines and keeps up with the changes and set it up for me and went over them with Dr. B yesterday            Recommendation:   Continue Current Plan of Care  Follow Up Plan:   Telephone follow-up in 1 week Telephone follow up appointment date/time:  04/03/24 2:00 pm   [name], RN, BSN, CCM [place]  [facility], [facility] Health Care Management Coordinator Direct Dial: [phone]

## 2024-03-30 ENCOUNTER — Other Ambulatory Visit: Payer: Self-pay | Admitting: Family Medicine

## 2024-03-30 ENCOUNTER — Other Ambulatory Visit: Payer: Self-pay

## 2024-03-31 ENCOUNTER — Other Ambulatory Visit: Payer: Self-pay

## 2024-04-01 ENCOUNTER — Other Ambulatory Visit: Payer: Self-pay

## 2024-04-01 MED ORDER — METOPROLOL SUCCINATE ER 25 MG PO TB24
12.5000 mg | ORAL_TABLET | Freq: Every day | ORAL | 0 refills | Status: DC
  Filled 2024-04-01: qty 15, 30d supply, fill #0

## 2024-04-01 MED ORDER — APIXABAN 2.5 MG PO TABS
2.5000 mg | ORAL_TABLET | Freq: Two times a day (BID) | ORAL | 0 refills | Status: DC
  Filled 2024-04-01 – 2024-04-04 (×2): qty 60, 30d supply, fill #0

## 2024-04-01 MED ORDER — FOLIC ACID 1 MG PO TABS
1.0000 mg | ORAL_TABLET | Freq: Every day | ORAL | 0 refills | Status: DC
  Filled 2024-04-01: qty 90, 90d supply, fill #0

## 2024-04-01 MED ORDER — DAPAGLIFLOZIN PROPANEDIOL 10 MG PO TABS
10.0000 mg | ORAL_TABLET | Freq: Every day | ORAL | 0 refills | Status: DC
  Filled 2024-04-01 – 2024-04-04 (×2): qty 30, 30d supply, fill #0

## 2024-04-01 MED ORDER — METOPROLOL SUCCINATE ER 25 MG PO TB24
12.5000 mg | ORAL_TABLET | Freq: Every day | ORAL | 1 refills | Status: DC
  Filled 2024-04-01 – 2024-04-04 (×2): qty 15, 30d supply, fill #0
  Filled 2024-04-29: qty 15, 30d supply, fill #1

## 2024-04-03 ENCOUNTER — Telehealth: Payer: Self-pay

## 2024-04-04 ENCOUNTER — Telehealth: Payer: Self-pay

## 2024-04-04 NOTE — Transitions of Care (Post Inpatient/ED Visit) (Signed)
 Care Management  Transitions of Care Program Transitions of Care Post-discharge week 3  04/04/2024 Name: Cameron Foster. MRN: [id number] DOB: 01/23/1941  Subjective: Cameron Foster. is a 83 y.o. year old male who is a primary care patient of Bacigalupo, Jon [name], MD. The Care Management team was unable to reach the patient by phone to assess and address transitions of care needs.   Plan: Additional outreach attempts will be made to reach the patient enrolled in the [facility] Program (Post Inpatient/ED Visit). This is call attempt # 2.  [name], RN, BSN, CCM [facility], [facility] Management Coordinator Direct Dial: [phone]

## 2024-04-05 ENCOUNTER — Telehealth: Payer: Self-pay

## 2024-04-05 NOTE — Patient Instructions (Addendum)
 Visit Information  Thank you for taking time to visit with me today. Please don't hesitate to contact me if I can be of assistance to you before our next scheduled telephone appointment.  Our next appointment is by telephone on April 15, 2024 at 1330  Following is a copy of your care plan:   Goals Addressed             This Visit's Progress    VBCI Transitions of Care (TOC) Care Plan       Problems: 03/19/24  Recent Hospitalization for treatment of Atrial Fibrillation and CHF On going HF management 03/27/24 Ongoing with diet, medication management (no desire to know his medications as far as having to managing them, sister refills medication boxes and reviews at MD appointment) and weighing daily. Noted:  Referral sent to [facility] with OV note, demos, labs yesterday per notes    04/05/24 Reviewed ongoing s/s and maintaining self-management. Patient at a crossroad in deciding to keep getting assistance in his home or going to an assisted living setting. He will discuss this with his sister and daughter over the holidays. His sister continues to fill his medication pill box. Patient has a life alert system. He is getting calls from [facility] to monitor his heart failure as well. Patient states he is consistent with weighing and his aide checks his blood pressures daily. Reviewed when to contact PCP or cardiologist with changes in weigh 2-3 pounds over night. Patient continues to wear his CPAP but doesn't like it but committed to wearing it.  Goal:  Over the next 30 days, the patient will not experience hospital readmission  Interventions:  Transitions of Care: Doctor Visits  - discussed the importance of doctor visits Reviewed appointments and medications, patient states no changes and was reviewed with his sister at MD appointment 03/26/24, reviewed medications for heart and diuretics, inhalers, and blood pressure.  Reviewed upcoming appointments and  concerns for long term needs for assistance with his health care. He states his sister, Norvel continues to support him and visits to fill his pill box and keep up with his medications.   AFIB Interventions:   Reviewed importance of adherence to anticoagulant exactly as prescribed Counseled on bleeding risk associated with anticoagulants and importance of self-monitoring for signs/symptoms of bleeding Assessed social determinant of health barriers  Heart Failure Interventions: Assessed need for readable accurate scales in home Advised patient to weigh each morning after emptying bladder Discussed importance of daily weight and advised patient to weigh and record daily Reviewed role of diuretics in prevention of fluid overload and management of heart failure; Discussed the importance of keeping all appointments with provider  Patient Self Care Activities:  Attend all scheduled provider appointments Call pharmacy for medication refills 3-7 days in advance of running out of medications Call provider office for new concerns or questions  Notify RN Care Manager of TOC call rescheduling needs Participate in Transition of Care Program/Attend TOC scheduled calls Take medications as prescribed   Patient has paid caregivers from 9 am - 1 pm daily  Plan:  The care management team will reach out to the patient again over the next 5-10 business days. The patient has been provided with contact information for the care management team and has been advised to call with any health related questions or concerns.  Discussed and offered 30 day TOC program.  Patient accepted weekly calls and week to week determination of progress.  The patient  has been provided with contact information for the care management team and has been advised to call with any health -related questions or concerns.  The patient verbalized understanding with current plan of care.  The patient is directed to their insurance card  regarding availability of benefits coverage. Patient states just got off the phone with his insurance company. 03/27/24 Reviewed plan of care and his needs, patient feels he is progressing well this time post hospital. Will follow up in 1 week phone call. 04/05/24 Reviewed plan of care and patient receiving calls from [facility] checking in with him states that they are calling and getting mixed up with current calls. Patient agrees to follow up after Christmas, April 15, 2024 scheduled. Patient confirms he know who to follow up with if needs arises for PCP and HF team.        The patient verbalized understanding of instructions, educational materials, and care plan provided today and DECLINED offer to receive copy of patient instructions, educational materials, and care plan.   The patient has been provided with contact information for the care management team and has been advised to call with any health related questions or concerns.   Please call the care guide team at [phone] if you need to cancel or reschedule your appointment.   Please call the Suicide and Crisis Lifeline: 988 call the [place]  National Suicide Prevention Lifeline: 219-538-4419 or TTY: 760-585-3651 TTY 2486166261) to talk to a trained counselor call 1-800-273-TALK (toll free, 24 hour hotline) call 911 if you are experiencing a Mental Health or Behavioral Health Crisis or need someone to talk to.  [name], RN, BSN, CCM [facility], [facility] Management Coordinator Direct Dial: [phone]

## 2024-04-05 NOTE — Transitions of Care (Post Inpatient/ED Visit) (Signed)
 " Transition of Care week 4  Visit Note  04/05/2024  Name: Cameron Foster. MRN: [id number]          DOB: [birth year]  Situation: Patient enrolled in [facility] 30-day program. Visit completed with patient by telephone.   Background:   Initial Transition Care Management Follow-up Telephone Call Discharge Date and Diagnosis: 03/12/24, Acute on chronic systolic CHF (congestive heart failure)   Past Medical History:  Diagnosis Date   Alcohol dependence (HCC) 09/30/2021   Alcoholism (HCC)    Allergy    Aortic atherosclerosis    Arthritis    Ascending aorta dilation 03/28/2018   a.) Vascular US : prox asc Ao measured 29 mm. b.)  CT CAP 06/22/2021: Ao root 41 mm. c.) TTE 06/26/2021: Ao root 41 mm; asc Ao 39 mm   Atrial fibrillation (HCC) 1995   a.) single episode in 1995 per patient; no long term treatment. b.) recurrent episode in the setting of GI bleeding related to colitis 06/2021.   Basal cell carcinoma 04/26/2017   Right medial cheek. Superficial and nodular   Basal cell carcinoma 06/11/2019   Left anterior shoulder. Nodular pattern   Basal cell carcinoma 09/09/2019   Right nasal ala, [facility]   Basal cell carcinoma 02/05/2020   L upper eyebrow, [facility]    Basal cell carcinoma 10/12/2022   left post auricular, [facility]   Basal cell carcinoma 05/02/2023   Right medial cheek, [facility]   Bilateral carotid artery stenosis 06/09/2019   a.) Carotid doppler: mod; <50% BILATERAL ICAs.   Cataract    CHF (congestive heart failure) (HCC)    Chicken pox    Colon cancer (HCC)    Colon polyp    Depression    Son died 04-25-2015   Diverticulitis    Diverticulosis 30 years   Encounter for other preprocedural examination 06/21/2021   Gastritis    GERD (gastroesophageal reflux disease)    H. pylori infection    History of stress test    a. 09/2015 MV: No ischemia/infarct. EF 45-54% (nl by echo).   Hyperlipidemia    Hypertension    Irritable bowel syndrome    Lacunar infarction (HCC) 06/08/2019   a.) small;  RIGHT motor strip   Myocardial infarction [facility])    Neuromuscular disorder (HCC)    NSVT (nonsustained ventricular tachycardia) (HCC)    a.)  Single episode lasting 5 beats at a maximum rate of 160 bpm noted on Holter study performed 08/13/2021.   Prostate cancer (HCC) 25-Apr-2011   a.) s/p XRT   PSVT (paroxysmal supraventricular tachycardia)    a. 06/2019 Zio: Avg rate 74 (54-120), occas PACs, rare PVCs, 125 episodes of PVCs (longest 17.5 secs; max rate 187). No afib.   RBBB    SAH (subarachnoid hemorrhage) (HCC) 10/10/2018   Sleep apnea    Squamous cell carcinoma of skin 07/18/2017   Left medial calf. KA type   Squamous cell carcinoma of skin 04/24/2018   Right above med. brow   Squamous cell carcinoma of skin 06/11/2019   Right posterior shoulder. SCCis, hypertrophic   Squamous cell carcinoma of skin 01/09/2020   Mid nasal dorsum, MOHS, Efudex x 4wks   Stroke [facility])    Substance abuse (HCC)    Systolic dysfunction    a.) TTE 10/05/2015: EF 50-55%; mild LVH; LAE; triv AR, mild MR. b.) TTE 03/29/2018: EF 55-60%; LAE, mild AR; ? small PFO. c.) TTE 06/09/2019: EF 55-60%, no rwma, triv MR/AI. d.) TTE 06/26/2021: EF 45-50%; glob HK; LAE;  mild MR; Ao root 41 mm; asc Ao 39 mm.   Vasovagal syncope     Assessment: Patient Reported Symptoms: Cognitive Cognitive Status: Alert and oriented to person, place, and time, Normal speech and language skills      Neurological Neurological Review of Symptoms: No symptoms reported Oher Neurological Symptoms/Conditions [RPT]: better this week Neurological Management Strategies: Medication therapy, Routine screening  HEENT HEENT Symptoms Reported: Nasal discharge HEENT Management Strategies: Medication therapy, Routine screening HEENT Self-Management Outcome: 3 (uncertain)    Cardiovascular Cardiovascular Symptoms Reported: No symptoms reported Cardiovascular Management Strategies: Medication therapy, Routine screening, Medical device Weight: 197 lb 5 oz  (89.5 kg) Cardiovascular Self-Management Outcome: 4 (good)  Respiratory Respiratory Symptoms Reported: Wheezing Other Respiratory Symptoms: from time to time, it's this constant nasal drainage    Endocrine Endocrine Symptoms Reported: No symptoms reported Is patient diabetic?: No Endocrine Self-Management Outcome: 4 (good)  Gastrointestinal Gastrointestinal Symptoms Reported: No symptoms reported      Genitourinary Genitourinary Symptoms Reported: No symptoms reported, Frequency Additional Genitourinary Details: diuretics Genitourinary Management Strategies: Fluid modification, Medication therapy  Integumentary Integumentary Symptoms Reported: No symptoms reported    Musculoskeletal Musculoskelatal Symptoms Reviewed: Unsteady gait Additional Musculoskeletal Details: walker Musculoskeletal Management Strategies: Medication therapy, Routine screening Musculoskeletal Self-Management Outcome: 3 (uncertain) Musculoskeletal Comment: I am not as strong like before all of this stuff Falls in the past year?: Yes Number of falls in past year: 1 or less Was there an injury with Fall?: No Fall Risk Category Calculator: 1 Patient Fall Risk Level: Low Fall Risk    Psychosocial Psychosocial Symptoms Reported: No symptoms reported         Today's Vitals   04/05/24 1340  BP: 120/70  Weight: 197 lb 5 oz (89.5 kg)   Pain Scale: 0-10 Pain Score: 0-No pain  Medications Reviewed Today   Medications were not reviewed in this encounter   04/05/24 Patient states, My sister still fixes my medications and that's one thing I don't have to worry about because she is handing it and I am taking them. My doctors haven't changed anything and I couldn't tell you anyway what is what.  Patient declines review of medications but states he takes his medications and is compliant.  Recommendation:   PCP Follow-up Continue Current Plan of Care  Follow Up Plan:   Telephone follow-up set for Monday  04/15/24 wanted after the holiday follow up will likely be with sister.  Cameron Fish, RN, BSN, CCM [facility], [facility] Management Coordinator Direct Dial: [phone]           "

## 2024-04-08 ENCOUNTER — Other Ambulatory Visit: Payer: Self-pay

## 2024-04-08 ENCOUNTER — Telehealth: Payer: Self-pay | Admitting: Family

## 2024-04-08 DIAGNOSIS — I9589 Other hypotension: Secondary | ICD-10-CM | POA: Diagnosis not present

## 2024-04-08 DIAGNOSIS — I130 Hypertensive heart and chronic kidney disease with heart failure and stage 1 through stage 4 chronic kidney disease, or unspecified chronic kidney disease: Secondary | ICD-10-CM | POA: Insufficient documentation

## 2024-04-08 DIAGNOSIS — Z8673 Personal history of transient ischemic attack (TIA), and cerebral infarction without residual deficits: Secondary | ICD-10-CM | POA: Insufficient documentation

## 2024-04-08 DIAGNOSIS — R944 Abnormal results of kidney function studies: Secondary | ICD-10-CM | POA: Insufficient documentation

## 2024-04-08 DIAGNOSIS — R42 Dizziness and giddiness: Secondary | ICD-10-CM | POA: Diagnosis present

## 2024-04-08 DIAGNOSIS — Z7901 Long term (current) use of anticoagulants: Secondary | ICD-10-CM | POA: Insufficient documentation

## 2024-04-08 DIAGNOSIS — N183 Chronic kidney disease, stage 3 unspecified: Secondary | ICD-10-CM | POA: Insufficient documentation

## 2024-04-08 DIAGNOSIS — I509 Heart failure, unspecified: Secondary | ICD-10-CM | POA: Diagnosis not present

## 2024-04-08 LAB — URINALYSIS, ROUTINE W REFLEX MICROSCOPIC
Bilirubin Urine: NEGATIVE
Glucose, UA: >=500 mg/dL — AB
Ketones, ur: NEGATIVE mg/dL
Leukocytes,Ua: NEGATIVE
Nitrite: NEGATIVE
Protein, ur: NEGATIVE mg/dL
Specific Gravity, Urine: 1.009 (ref 1.005–1.030)
pH: 5 (ref 5.0–8.0)

## 2024-04-08 LAB — CBC WITH DIFFERENTIAL/PLATELET
Abs Immature Granulocytes: 0.11 K/uL — ABNORMAL HIGH (ref 0.00–0.07)
Basophils Absolute: 0 K/uL (ref 0.0–0.1)
Basophils Relative: 0 %
Eosinophils Absolute: 0 K/uL (ref 0.0–0.5)
Eosinophils Relative: 0 %
HCT: 34.7 % — ABNORMAL LOW (ref 39.0–52.0)
Hemoglobin: 10.8 g/dL — ABNORMAL LOW (ref 13.0–17.0)
Immature Granulocytes: 2 %
Lymphocytes Relative: 4 %
Lymphs Abs: 0.3 K/uL — ABNORMAL LOW (ref 0.7–4.0)
MCH: 27.8 pg (ref 26.0–34.0)
MCHC: 31.1 g/dL (ref 30.0–36.0)
MCV: 89.4 fL (ref 80.0–100.0)
Monocytes Absolute: 0.3 K/uL (ref 0.1–1.0)
Monocytes Relative: 5 %
Neutro Abs: 6.2 K/uL (ref 1.7–7.7)
Neutrophils Relative %: 89 %
Platelets: 189 K/uL (ref 150–400)
RBC: 3.88 MIL/uL — ABNORMAL LOW (ref 4.22–5.81)
RDW: 17.2 % — ABNORMAL HIGH (ref 11.5–15.5)
WBC: 6.9 K/uL (ref 4.0–10.5)
nRBC: 0 % (ref 0.0–0.2)

## 2024-04-08 LAB — BASIC METABOLIC PANEL WITH GFR
Anion gap: 16 — ABNORMAL HIGH (ref 5–15)
BUN: 33 mg/dL — ABNORMAL HIGH (ref 8–23)
CO2: 26 mmol/L (ref 22–32)
Calcium: 8.9 mg/dL (ref 8.9–10.3)
Chloride: 96 mmol/L — ABNORMAL LOW (ref 98–111)
Creatinine, Ser: 2.32 mg/dL — ABNORMAL HIGH (ref 0.61–1.24)
GFR, Estimated: 27 mL/min — ABNORMAL LOW
Glucose, Bld: 139 mg/dL — ABNORMAL HIGH (ref 70–99)
Potassium: 4.1 mmol/L (ref 3.5–5.1)
Sodium: 138 mmol/L (ref 135–145)

## 2024-04-08 LAB — TROPONIN T, HIGH SENSITIVITY
Troponin T High Sensitivity: 49 ng/L — ABNORMAL HIGH (ref 0–19)
Troponin T High Sensitivity: 47 ng/L — ABNORMAL HIGH (ref 0–19)

## 2024-04-08 NOTE — ED Triage Notes (Addendum)
 Pt reports he noticed his BP was low tonight, pt got a reading of 88/48 pta. Pt reports feeling dizzy and lightheaded. Able to move all extremities, no facial droop or slurred speech/ pt reports inc in urinary freq over past few days, denies dysuria.

## 2024-04-09 ENCOUNTER — Emergency Department: Admission: EM | Admit: 2024-04-09 | Discharge: 2024-04-09 | Disposition: A | Discharge status: Home / Self Care

## 2024-04-09 ENCOUNTER — Emergency Department

## 2024-04-09 ENCOUNTER — Telehealth: Payer: Self-pay

## 2024-04-09 DIAGNOSIS — I959 Hypotension, unspecified: Secondary | ICD-10-CM

## 2024-04-09 NOTE — ED Provider Notes (Signed)
 "  [facility] Provider Note    Event Date/Time   First MD Initiated Contact with Patient 04/09/24 0121     (approximate)   History   Hypotension (/)   HPI  Cameron Sagan. is a 83 y.o. male  CHF (30-35%), paroxysmal A-fib on Eliquis , CVA, alcohol and tobacco use (in remission), hyperlipidemia hypertension, CKD stage III OSA who presents to the emergency department with several days of transient hypotension.  History is obtained both by the patient and his sister at bedside.  Patient's torsemide  was recently changed from twice daily to the same dose all at once in the morning as patient kept urinating throughout the night and could not tolerate much more.  For the past 3 days he has had some low blood pressures in the morning with the home health aides and felt lightheaded with this.  Denies any chest pain shortness of breath falls when these episodes occur he has difficulty ambulating however he is currently asymptomatic.  His family had tried calling the cardiology office this afternoon but did not get a response and subsequently came to the emergency department      Physical Exam   Triage Vital Signs: ED Triage Vitals  Encounter Vitals Group     BP 04/08/24 2035 96/72     Girls Systolic BP Percentile --      Girls Diastolic BP Percentile --      Boys Systolic BP Percentile --      Boys Diastolic BP Percentile --      Pulse Rate 04/08/24 2035 93     Resp 04/08/24 2035 18     Temp 04/08/24 2035 97.8 F (36.6 C)     Temp Source 04/08/24 2219 Oral     SpO2 04/08/24 2035 95 %     Weight 04/08/24 2034 193 lb (87.5 kg)     Height 04/08/24 2034 5' 8 (1.727 m)     Head Circumference --      Peak Flow --      Pain Score 04/08/24 2034 0     Pain Loc --      Pain Education --      Exclude from Growth Chart --     Most recent vital signs: Vitals:   04/09/24 0210 04/09/24 0220  BP:    Pulse: 76   Resp:    Temp:  98.3 F (36.8 C)  SpO2: 97%      Nursing Triage Note reviewed. Vital signs reviewed and patients oxygen saturation is normoxic both at rest and with ambulation  General: Patient is well nourished, well developed, awake and alert, resting comfortably in no acute distress, Head: Normocephalic and atraumatic Eyes: Normal inspection, extraocular muscles intact, no conjunctival pallor Ear, nose, throat: Normal external exam Neck: Normal range of motion Respiratory: Patient is in no respiratory distress, lungs CTAB Cardiovascular: Patient is not tachycardic, RRR without murmur appreciated GI: Abd SNT with no guarding or rebound  Back: Normal inspection of the back with good strength and range of motion throughout all ext Extremities: pulses intact with good cap refills,  Right lower extremity is more edematous than the left however patient reports this has been chronic from a calf injury many years ago and there was no change Neuro: The patient is alert and oriented to person, place, and time, appropriately conversive, with 5/5 bilat UE/LE strength, no gross motor or sensory defects noted. Coordination appears to be adequate.  No dysmetria to finger-to-nose or  rapid hand, ambulates without ataxia with his walker Skin: Warm, dry, and intact Psych: normal mood and affect, no SI or HI  ED Results / Procedures / Treatments   Labs (all labs ordered are listed, but only abnormal results are displayed) Labs Reviewed  CBC WITH DIFFERENTIAL/PLATELET - Abnormal; Notable for the following components:      Result Value   RBC 3.88 (*)    Hemoglobin 10.8 (*)    HCT 34.7 (*)    RDW 17.2 (*)    Lymphs Abs 0.3 (*)    Abs Immature Granulocytes 0.11 (*)    All other components within normal limits  BASIC METABOLIC PANEL WITH GFR - Abnormal; Notable for the following components:   Chloride 96 (*)    Glucose, Bld 139 (*)    BUN 33 (*)    Creatinine, Ser 2.32 (*)    GFR, Estimated 27 (*)    Anion gap 16 (*)    All other components  within normal limits  URINALYSIS, ROUTINE W REFLEX MICROSCOPIC - Abnormal; Notable for the following components:   Color, Urine YELLOW (*)    APPearance CLEAR (*)    Glucose, UA >=500 (*)    Hgb urine dipstick SMALL (*)    Bacteria, UA RARE (*)    All other components within normal limits  TROPONIN T, HIGH SENSITIVITY - Abnormal; Notable for the following components:   Troponin T High Sensitivity 49 (*)    All other components within normal limits  TROPONIN T, HIGH SENSITIVITY - Abnormal; Notable for the following components:   Troponin T High Sensitivity 47 (*)    All other components within normal limits     EKG EKG and rhythm strip are interpreted by myself:   EKG: [Normal sinus rhythm] at heart rate of 90, normal QRS duration, QTc 537, nonspecific ST segments and T waves no ectopy EKG not consistent with Acute STEMI Rhythm strip: NSR in lead II   RADIOLOGY CXR: No acute abnormality on my independent review interpretation radiologist agrees    PROCEDURES:  Critical Care performed: No  Procedures   MEDICATIONS ORDERED IN ED: Medications - No data to display   IMPRESSION / MDM / ASSESSMENT AND PLAN / ED COURSE                                Differential diagnosis includes, but is not limited to, overdiuresis, arrhythmia, CHF, pneumonia, electrolyte derangement   ED course: Patient presents and has no focal neurological deficits on exam including no ataxia.  I did consider TIA however history is more suspicious for symptoms according for transient hypotension in the setting of overdiuresis in the morning.  Patient tells me he has lost over 22 pounds over the past month secondary to his water  pill.  EKG demonstrated no profound anemia or ischemia and he had no electrolyte derangements or anemia.  Urinalysis was not consistent with UTI.  Patient's creatinine is slightly more elevated than his baseline at 2.32 today then 2.1 I suspect this is secondary to his torsemide .   Patient feels comfortable returning home and has ambulated around the unit without any lightheadedness.  Until he can follow-up with his cardiologist he will cut his torsemide  dose in half.  He will return with any acutely worsening symptoms   Clinical Course as of 04/09/24 0228  Tue Apr 09, 2024  0122 CBC with Differential(!) No leukocytosis or profound anemia [HD]  0123 Creatinine(!): 2.32 Creatinine slightly more elevated than baseline [HD]  0123 Urinalysis, Routine w reflex microscopic -Urine, Clean Catch(!) Not consistent with UTI [HD]  0123 Troponin T High Sensitivity(!): 47 Stable x 2 but slightly elevated with no old to compare [HD]  0127 Patient currently urinating, requested they come back when tried to introduce myself [HD]  0200 DG Chest 2 View No acute abnormality [HD]  0214 Patient ambulated and took p.o. without desaturating.  Chest x-ray unremarkable.  He will cut his torsemide  dose in half in the morning until he can follow-up with his cardiologist.  All questions answered and patient and sister feel comfortable returning home [HD]    Clinical Course User Index [HD] Nicholaus Rolland BRAVO, MD   At time of discharge there is no evidence of acute life, limb, vision, or fertility threat. Patient has stable vital signs, pain is well controlled, patient is ambulatory and p.o. tolerant.  Discharge instructions were completed using the EPIC system. I would refer you to those at this time. All warnings prescriptions follow-up etc. were discussed in detail with the patient. Patient indicates understanding and is agreeable with this plan. All questions answered.  Patient is made aware that they may return to the emergency department for any worsening or new condition or for any other emergency.  -- Risk: 5 This patient has a high risk of morbidity due to further diagnostic testing or treatment. Rationale: This patients evaluation and management involve a high risk of morbidity due to  the potential severity of presenting symptoms, need for diagnostic testing, and/or initiation of treatment that may require close monitoring. The differential includes conditions with potential for significant deterioration or requiring escalation of care. Treatment decisions in the ED, including medication administration, procedural interventions, or disposition planning, reflect this level of risk. COPA: 5 The patient has the following acute or chronic illness/injury that poses a possible threat to life or bodily function: [X] : The patient has a potentially serious acute condition or an acute exacerbation of a chronic illness requiring urgent evaluation and management in the Emergency Department. The clinical presentation necessitates immediate consideration of life-threatening or function-threatening diagnoses, even if they are ultimately ruled out.   FINAL CLINICAL IMPRESSION(S) / ED DIAGNOSES   Final diagnoses:  Transient hypotension     Rx / DC Orders   ED Discharge Orders     None        Note:  This document was prepared using Dragon voice recognition software and may include unintentional dictation errors.   Nicholaus Rolland BRAVO, MD 04/09/24 [phone]  "

## 2024-04-09 NOTE — Discharge Instructions (Signed)
 You was seen in our emergency department for transient low blood pressure.  Workup was reassuring.  You are safe to return home.  Given your symptoms I would cut your torsemide  dose in half until you can follow-up with your cardiology team.  Please return with any acutely worsening symptoms or any other emergency.  Is very nice meeting you and I wish you the best of luck. -- RETURN PRECAUTIONS & AFTERCARE: (ENGLISH) RETURN PRECAUTIONS: Return immediately to the emergency department or see/call your doctor if you feel worse, weak or have changes in speech or vision, are short of breath, have fever, vomiting, pain, bleeding or dark stool, trouble urinating or any new issues. Return here or see/call your doctor if not improving as expected for your suspected condition. FOLLOW-UP CARE: Call your doctor and/or any doctors we referred you to for more advice and to make an appointment. Do this today, tomorrow or after the weekend. Some doctors only take PPO insurance so if you have HMO insurance you may want to contact your HMO or your regular doctor for referral to a specialist within your plan. Either way tell the doctor's office that it was a referral from the emergency department so you get the soonest possible appointment.  YOUR TEST RESULTS: Take result reports of any blood or urine tests, imaging tests and EKG's to your doctor and any referral doctor. Have any abnormal tests repeated. Your doctor or a referral doctor can let you know when this should be done. Also make sure your doctor contacts this hospital to get any test results that are not currently available such as cultures or special tests for infection and final imaging reports, which are often not available at the time you leave the ER but which may list additional important findings that are not documented on the preliminary report. BLOOD PRESSURE: If your blood pressure was greater than 120/80 have your blood pressure rechecked within 1 to 2  weeks. MEDICATION SIDE EFFECTS: Do not drive, walk, bike, take the bus, etc. if you have received or are being prescribed any sedating medications such as those for pain or anxiety or certain antihistamines like Benadryl . If you have been give one of these here get a taxi home or have a friend drive you home. Ask your pharmacist to counsel you on potential side effects of any new medication

## 2024-04-09 NOTE — Telephone Encounter (Signed)
 Spoke to pt about recommendations. Pt agreeable to contact us  if low bp's persist. No further questions at this time.

## 2024-04-09 NOTE — Telephone Encounter (Signed)
 Callers name: Norvel Relation to patient: (sister)  Call back phone #:   Pharmacy (if applicable):   Issue/reason for call: sister called to make us  a aware that pt had a low blood pressure yesterday and they went to the ED. Per the ED pt to change Torsemide  to 20mg  daily. Sister wanted to know if that is ok. His weight has gone from 205lbs at his last appt to 193lbs yesterday in the ED. The sister thinks this was fluid loss. Advice?

## 2024-04-09 NOTE — ED Notes (Signed)
 Pt ambulated around the room with walker, denies shortness of breath. NADN

## 2024-04-15 ENCOUNTER — Other Ambulatory Visit: Payer: Self-pay | Admitting: Physician Assistant

## 2024-04-15 ENCOUNTER — Telehealth: Payer: Self-pay

## 2024-04-15 ENCOUNTER — Other Ambulatory Visit: Payer: Self-pay

## 2024-04-15 DIAGNOSIS — J324 Chronic pansinusitis: Secondary | ICD-10-CM

## 2024-04-15 DIAGNOSIS — I5021 Acute systolic (congestive) heart failure: Secondary | ICD-10-CM

## 2024-04-15 DIAGNOSIS — R0602 Shortness of breath: Secondary | ICD-10-CM

## 2024-04-15 DIAGNOSIS — I4819 Other persistent atrial fibrillation: Secondary | ICD-10-CM

## 2024-04-15 DIAGNOSIS — I4891 Unspecified atrial fibrillation: Secondary | ICD-10-CM

## 2024-04-15 DIAGNOSIS — G8929 Other chronic pain: Secondary | ICD-10-CM

## 2024-04-15 NOTE — Telephone Encounter (Signed)
 Pt sister called to get advice on medications. States that after the holidays she started her brother back on torsemide  40mg  daily because his weight started to gain weight. Weight went up to 197lbs. Since restarting torsemide  40mg  his weight has went down to 195lbs and his blood pressure started to be in the low 90's systolic. Advised that he should continue torsemide  20mg  daily per the last ED visit and Jama, NP advice. Sister also stated that if his heart rate was in the 90's at night, she would give metoprolol  25mg  instead of 12.5mg . Advised to give medications as prescribed. Also advised if bp is less that 95 systolic, hold metoprolol  that day.

## 2024-04-15 NOTE — Patient Instructions (Addendum)
 Visit Information  Thank you for taking time to visit with me today. Please don't hesitate to contact me if I can be of assistance to you before our next scheduled telephone appointment.  Our next appointment is being referred to longitudinal CCM RN.  Following is a copy of your care plan:   Goals Addressed             This Visit's Progress    VBCI Transitions of Care (TOC) Care Plan   On track    Problems: 03/19/24  Recent Hospitalization for treatment of Atrial Fibrillation and CHF On going HF management 03/27/24 Ongoing with diet, medication management (no desire to know his medications as far as having to managing them, sister refills medication boxes and reviews at MD appointment) and weighing daily. Noted:  Referral sent to [facility] with OV note, demos, labs yesterday per notes    04/05/24 Reviewed ongoing s/s and maintaining self-management. Patient at a crossroad in deciding to keep getting assistance in his home or going to an assisted living setting. He will discuss this with his sister and daughter over the holidays. His sister continues to fill his medication pill box. Patient has a life alert system. He is getting calls from [facility] to monitor his heart failure as well. Patient states he is consistent with weighing and his aide checks his blood pressures daily. Reviewed when to contact PCP or cardiologist with changes in weigh 2-3 pounds over night. Patient continues to wear his CPAP but doesn't like it but committed to wearing it. 04/15/24 Patient had low pressures in the evenings and went to ED. Torsemide  20 mg was adjusted to 1 tablet for 2 days and now back to Torsemide  20 mg 2 tablets daily. Spoke with sister Norvel about medications today. Patient is encouraged to continue to check BP.  Patient states his aide checks and the Home Health checks  Goal:  Over the next 30 days, the patient will not experience hospital  readmission  Interventions:  Transitions of Care: Doctor Visits  - discussed the importance of doctor visits Reviewed appointments and medications, patient states no changes and was reviewed with his sister at MD appointment 03/26/24, reviewed medications for heart and diuretics, inhalers, and blood pressure.  Reviewed upcoming appointments and concerns for long term needs for assistance with his health care. He states his sister, Norvel continues to support him and visits to fill his pill box and keep up with his medications.  04/15/24 Reviewed upcoming appointments and medications.  AFIB Interventions:   Reviewed importance of adherence to anticoagulant exactly as prescribed Counseled on bleeding risk associated with anticoagulants and importance of self-monitoring for signs/symptoms of bleeding Assessed social determinant of health barriers  Heart Failure Interventions: Assessed need for readable accurate scales in home Advised patient to weigh each morning after emptying bladder Discussed importance of daily weight and advised patient to weigh and record daily Reviewed role of diuretics in prevention of fluid overload and management of heart failure; Discussed the importance of keeping all appointments with provider  Patient Self Care Activities:  Attend all scheduled provider appointments Call pharmacy for medication refills 3-7 days in advance of running out of medications Call provider office for new concerns or questions  Notify RN Care Manager of TOC call rescheduling needs Participate in Transition of Care Program/Attend TOC scheduled calls Take medications as prescribed   Patient has paid caregivers from 9 am - 1 pm daily  Plan:  The care  management team will reach out to the patient again over the next 5-10 business days. The patient has been provided with contact information for the care management team and has been advised to call with any health related questions or  concerns.  Discussed and offered 30 day TOC program.  Patient accepted weekly calls and week to week determination of progress.  The patient has been provided with contact information for the care management team and has been advised to call with any health -related questions or concerns.  The patient verbalized understanding with current plan of care.  The patient is directed to their insurance card regarding availability of benefits coverage. Patient states just got off the phone with his insurance company. 03/27/24 Reviewed plan of care and his needs, patient feels he is progressing well this time post hospital. Will follow up in 1 week phone call. 04/05/24 Reviewed plan of care and patient receiving calls from [facility] checking in with him states that they are calling and getting mixed up with current calls. Patient agrees to follow up after Christmas, April 15, 2024 scheduled. Patient confirms he know who to follow up with if needs arises for PCP and HF team. 04/15/24 Completed 30 day program, continues to have [facility], HH with [facility], sister says HHPT is planning to continue however, HHOT maybe coming to a close. Patient agrees to longitudinal RN for follow up with Complex Care Management needs.        Patient verbalizes understanding of instructions and care plan provided today and agrees to view in MyChart. Active MyChart status and patient understanding of how to access instructions and care plan via MyChart confirmed with patient.     The patient has been provided with contact information for the care management team and has been advised to call with any health related questions or concerns.  Patient and sister states they know who to reach out to for questions and follow up. Sister is reaching out to [place] class at [facility] clinic regarding Torsemide  and patient's low BP at times.  Please call the care guide team at [phone] if you need to cancel or  reschedule your appointment.   Please call the Suicide and Crisis Lifeline: 988 call the [place]  National Suicide Prevention Lifeline: 859 027 7852 or TTY: (506)691-6143 TTY 619-086-0125) to talk to a trained counselor call 1-800-273-TALK (toll free, 24 hour hotline) call 911 if you are experiencing a Mental Health or Behavioral Health Crisis or need someone to talk to.  [name], RN, BSN, CCM [facility], [facility] Management Coordinator Direct Dial: [phone]

## 2024-04-15 NOTE — Transitions of Care (Post Inpatient/ED Visit) (Signed)
 " Transition of Care Week 4/5  Visit Note  04/15/2024  Name: Cameron Foster. MRN: [id number]          DOB: 04-18-1941  Situation: Patient enrolled in [facility] 30-day program. Visit completed with patient by telephone.  Also spoke with sister Norvel at [phone] per patient's request regarding medications adjustments over the ED/holiday visit.  Background:   Initial Transition Care Management Follow-up Telephone Call Discharge Date and Diagnosis: 03/12/24, Acute on chronic systolic CHF (congestive heart failure)   Past Medical History:  Diagnosis Date   Alcohol dependence (HCC) 09/30/2021   Alcoholism (HCC)    Allergy    Aortic atherosclerosis    Arthritis    Ascending aorta dilation 03/28/2018   a.) Vascular US : prox asc Ao measured 29 mm. b.)  CT CAP 06/22/2021: Ao root 41 mm. c.) TTE 06/26/2021: Ao root 41 mm; asc Ao 39 mm   Atrial fibrillation (HCC) 1995   a.) single episode in 1995 per patient; no long term treatment. b.) recurrent episode in the setting of GI bleeding related to colitis 06/2021.   Basal cell carcinoma 04/26/2017   Right medial cheek. Superficial and nodular   Basal cell carcinoma 06/11/2019   Left anterior shoulder. Nodular pattern   Basal cell carcinoma 09/09/2019   Right nasal ala, EDC   Basal cell carcinoma 02/05/2020   L upper eyebrow, EDC    Basal cell carcinoma 10/12/2022   left post auricular, EDC   Basal cell carcinoma 05/02/2023   Right medial cheek, EDC   Bilateral carotid artery stenosis 06/09/2019   a.) Carotid doppler: mod; <50% BILATERAL ICAs.   Cataract    CHF (congestive heart failure) (HCC)    Chicken pox    Colon cancer (HCC)    Colon polyp    Depression    Son died May 04, 2014   Diverticulitis    Diverticulosis 30 years   Encounter for other preprocedural examination 06/21/2021   Gastritis    GERD (gastroesophageal reflux disease)    H. pylori infection    History of stress test    a. 09/2015 MV: No ischemia/infarct. EF 45-54%  (nl by echo).   Hyperlipidemia    Hypertension    Irritable bowel syndrome    Lacunar infarction (HCC) 06/08/2019   a.) small; RIGHT motor strip   Myocardial infarction [facility])    Neuromuscular disorder (HCC)    NSVT (nonsustained ventricular tachycardia) (HCC)    a.)  Single episode lasting 5 beats at a maximum rate of 160 bpm noted on Holter study performed 08/13/2021.   Prostate cancer (HCC) May 04, 2010   a.) s/p XRT   PSVT (paroxysmal supraventricular tachycardia)    a. 06/2019 Zio: Avg rate 74 (54-120), occas PACs, rare PVCs, 125 episodes of PVCs (longest 17.5 secs; max rate 187). No afib.   RBBB    SAH (subarachnoid hemorrhage) (HCC) 10/10/2018   Sleep apnea    Squamous cell carcinoma of skin 07/18/2017   Left medial calf. KA type   Squamous cell carcinoma of skin 04/24/2018   Right above med. brow   Squamous cell carcinoma of skin 06/11/2019   Right posterior shoulder. SCCis, hypertrophic   Squamous cell carcinoma of skin 01/09/2020   Mid nasal dorsum, MOHS, Efudex x 4wks   Stroke [facility])    Substance abuse (HCC)    Systolic dysfunction    a.) TTE 10/05/2015: EF 50-55%; mild LVH; LAE; triv AR, mild MR. b.) TTE 03/29/2018: EF 55-60%; LAE, mild AR; ? small PFO.  c.) TTE 06/09/2019: EF 55-60%, no rwma, triv MR/AI. d.) TTE 06/26/2021: EF 45-50%; glob HK; LAE; mild MR; Ao root 41 mm; asc Ao 39 mm.   Vasovagal syncope     Assessment: Patient Reported Symptoms: Cognitive Cognitive Status: Able to follow simple commands, Alert and oriented to person, place, and time, Normal speech and language skills      Neurological Neurological Review of Symptoms: No symptoms reported Neurological Management Strategies: Medication therapy, Routine screening Neurological Self-Management Outcome: 4 (good)  HEENT HEENT Symptoms Reported: Nasal discharge (chronic) HEENT Management Strategies: Medication therapy, Routine screening    Cardiovascular Cardiovascular Symptoms Reported: No symptoms  reported Cardiovascular Management Strategies: Medication therapy, Routine screening, Medical device  Respiratory Respiratory Symptoms Reported: Dry cough, Productive cough Other Respiratory Symptoms: Still having the nasal drainage (chronic) Respiratory Management Strategies: Adequate rest, Medication therapy, Routine screening Respiratory Self-Management Outcome: 3 (uncertain)  Endocrine Endocrine Symptoms Reported: No symptoms reported Is patient diabetic?: No    Gastrointestinal Gastrointestinal Symptoms Reported: No symptoms reported Gastrointestinal Self-Management Outcome: 4 (good)    Genitourinary Genitourinary Symptoms Reported: Frequency Additional Genitourinary Details: diuretics were changed Genitourinary Management Strategies: Fluid modification, Medication therapy Genitourinary Self-Management Outcome: 3 (uncertain) Genitourinary Comment: My diuretic was changed  Integumentary Integumentary Symptoms Reported: No symptoms reported Additional Integumentary Details: Patient has creams for any outbreaks or rashes    Musculoskeletal Musculoskelatal Symptoms Reviewed: Unsteady gait Additional Musculoskeletal Details: walker Musculoskeletal Management Strategies: Medication therapy, Routine screening Musculoskeletal Self-Management Outcome: 3 (uncertain)      Psychosocial Psychosocial Symptoms Reported: No symptoms reported (Patient has a history of major depression but denies issues except wishes he had the strenght and energy he use to have but thankful for his sister, and aide)         Today's Vitals   Pain Scale: 0-10 Pain Score: 0-No pain  Medications Reviewed Today     Reviewed by Eilleen Richerd GRADE, RN (Registered Nurse) on 04/15/24 at 1421  Med List Status: <None>   Medication Order Taking? Sig Documenting Provider Last Dose Status Informant  albuterol  (VENTOLIN  HFA) 108 (90 Base) MCG/ACT inhaler [id number] Yes Inhale 2 puffs into the lungs every 6 (six) hours  as needed for wheezing or shortness of breath. Levander Slate, MD  Active Family Member           Med Note GROVER BURNARD GORMAN [place] Feb 29, 2024  6:57 PM) PRN  Alpha-Lipoic Acid 600 MG TABS [id number] Yes Take 1 tablet by mouth daily. [provider]  Active Family Member  amiodarone  (PACERONE ) 200 MG tablet [id number] Yes Take 1 tablet (200 mg total) by mouth daily. Zenaida Morene PARAS, MD  Active Family Member  apixaban  (ELIQUIS ) 2.5 MG TABS tablet [id number] Yes Take 1 tablet (2.5 mg total) by mouth 2 (two) times daily. Myrla Jon [name], MD  Active   atorvastatin  (LIPITOR) 40 MG tablet [id number] Yes Take 1 tablet (40 mg total) by mouth daily. Myrla Jon [name], MD  Active Family Member  cetirizine  (ZYRTEC ) 10 MG tablet [id number] Yes Take 10 mg by mouth daily. [provider]  Active Family Member  Cholecalciferol  (VITAMIN D-3) 25 MCG (1000 UT) CAPS [id number] Yes Take 1,000 Units by mouth daily. [provider]  Active Family Member  dapagliflozin  propanediol (FARXIGA ) 10 MG TABS tablet [id number] Yes Take 1 tablet (10 mg total) by mouth daily. Myrla Jon [name], MD  Active   dextromethorphan -guaiFENesin  (MUCINEX  DM) 30-600 MG 12hr tablet [id number]  Take 1 tablet by mouth 2 (  two) times daily as needed for cough.  Patient not taking: Reported on 03/27/2024   [provider]  Active Family Member  docusate sodium  (COLACE) 100 MG capsule [id number] Yes Take 1 capsule (100 mg total) by mouth 2 (two) times daily. Joshua Lin, PA-C  Active Family Member           Med Note HODGKIN, [facility]   Thu Dec 21, 2023  7:30 AM)    fluticasone  (FLONASE ) 50 MCG/ACT nasal spray [id number] Yes PLACE 1 SPRAY INTO BOTH NOSTRILS DAILY AS NEEDED FOR ALLERGIES OR RHINITIS. Myrla Jon [name], MD  Active Family Member  folic acid  (FOLVITE ) 1 MG tablet [id number] Yes Take 1 tablet (1 mg total) by mouth daily. Myrla Jon [name], MD  Active   hydrocortisone  2.5 % cream [id number]   APPLY TO AFFECTED AREA TWICE A DAY AS NEEDED FOR RASH Bacigalupo, Jon [name], MD  Active Family Member           Med Note LESLY, RICHERD CINDERELLA [place] Apr 15, 2024  2:18 PM) Only as needed  ketoconazole  (NIZORAL ) 2 % cream [id number]  Apply once or twice daily to affected skin folds as needed for rash Jackquline Sawyer, MD  Active Family Member           Med Note LESLY, RICHERD CINDERELLA [place] Apr 15, 2024  2:18 PM) Only as needed  metoprolol  succinate (TOPROL -XL) 25 MG 24 hr tablet [id number] Yes Take 0.5 tablets (12.5 mg total) by mouth daily. Myrla Jon [name], MD  Active   omeprazole  (PRILOSEC) 20 MG capsule [id number]  Take 1 capsule (20 mg total) by mouth daily as needed. Bacigalupo, Angela M, MD  Active Family Member  potassium chloride  SA (KLOR-CON  M) 20 MEQ tablet [id number] Yes Take 2 tablets (40 mEq total) by mouth daily. Donette Ellouise LABOR, FNP  Active Family Member  predniSONE  (DELTASONE ) 20 MG tablet [id number]  Take 1 tablet (20 mg total) by mouth daily with breakfast. Josette Ade, MD  Active            Med Note LESLY, RICHERD CINDERELLA [place] Mar 27, 2024  2:07 PM) Likely finishing this treatment next  sertraline  (ZOLOFT ) 100 MG tablet [id number] Yes Take 1 tablet (100 mg total) by mouth daily. Myrla Jon [name], MD  Active Family Member  tamsulosin  (FLOMAX ) 0.4 MG CAPS capsule [id number] Yes Take 1 capsule (0.4 mg total) by mouth daily. Josette Ade, MD  Active   thiamine  (VITAMIN B1) 100 MG tablet [id number] Yes Take 100 mg by mouth daily. [provider]  Active Family Member  torsemide  (DEMADEX ) 20 MG tablet [id number] Yes Take 2 tablets (40 mg total) by mouth daily. Josette Ade, MD  Active            Med Note LESLY, RICHERD CINDERELLA [place] Apr 15, 2024  2:21 PM) Patient was taking 1 tablet a few days from ED visit and spoke with sister Norvel and she states he started back with 2 tablets on 04/13/24 due to low blood pressures  vitamin B-12 (CYANOCOBALAMIN ) 1000 MCG tablet [id number]  Yes Take 1,000 mcg by mouth daily. [provider]  Active Family Member  zinc  gluconate 50 MG tablet [id number] Yes Take 50 mg by mouth daily. [provider]  Active Family Member  Med List Note Lesly, Richerd CINDERELLA PEAK 03/19/24 1402): 12/2 Patient states, my sister, Norvel, manages all of my medicines and keeps up with the changes and set  it up for me and went over them with Dr. B yesterday            Recommendation:   Referral to: Longitudinal CCM RN Care gaps were reviewed with patient today and patient verbalized understanding to follow up with primary care provider during office visits and discuss any concerns.   Follow Up Plan:   Referral to RN Case Manager Closing From:  Transitions of Care Program Patient has met all care management goals. Care Management case will be closed. Patient has been provided contact information should new needs arise.   [name], RN, BSN, CCM [facility], [facility] Management Coordinator Direct Dial: [phone]           "

## 2024-04-16 ENCOUNTER — Other Ambulatory Visit: Payer: Self-pay

## 2024-04-16 MED FILL — Torsemide Tab 20 MG: ORAL | 30 days supply | Qty: 60 | Fill #0 | Status: AC

## 2024-04-17 ENCOUNTER — Telehealth: Payer: Self-pay | Admitting: Family

## 2024-04-17 ENCOUNTER — Other Ambulatory Visit: Payer: Self-pay

## 2024-04-17 ENCOUNTER — Other Ambulatory Visit: Payer: Self-pay | Admitting: Family Medicine

## 2024-04-17 MED ORDER — TAMSULOSIN HCL 0.4 MG PO CAPS
0.4000 mg | ORAL_CAPSULE | Freq: Every day | ORAL | 0 refills | Status: DC
  Filled 2024-04-17: qty 15, 15d supply, fill #0

## 2024-04-23 ENCOUNTER — Telehealth: Payer: Self-pay | Admitting: Internal Medicine

## 2024-04-23 NOTE — Telephone Encounter (Signed)
 Called patient and no answer. No voicemail box set up.

## 2024-04-23 NOTE — Telephone Encounter (Signed)
 Spoke with patient on phone regarding low BP after meals. Patient states this typically only happens after large meals. Told patient this can happen because blood will shunt to the stomach/intestines to help digest food after a large meal and take away from other parts of the body. Patient states after his food digests he typically feels better. I told patient he should opt for eating smaller meals instead of large meals. Patient verbalized understanding and will do that from now on and see how he feels. No further needs at this time.

## 2024-04-23 NOTE — Telephone Encounter (Signed)
" °  Pt c/o BP issue: STAT if pt c/o blurred vision, one-sided weakness or slurred speech.  STAT if BP is GREATER than 180/120 TODAY.  STAT if BP is LESS than 90/60 and SYMPTOMATIC TODAY  1. What is your BP concern? Low BP  2. Have you taken any BP medication today?not sure  3. What are your last 5 BP readings? Systolic around 88-90s   4. Are you having any other symptoms (ex. Dizziness, headache, blurred vision, passed out)? Dizziness, lightheadedness and weakness    Kellie from [facility] called to report concerns regarding the patient's blood pressure. She stated that the patient's systolic blood pressure drops to the 88-90s after every large meal. The patient is also experiencing dizziness, lightheadedness, and weakness. Please call the patient directly with any recommendations. "

## 2024-04-24 ENCOUNTER — Ambulatory Visit: Admitting: Family

## 2024-04-25 ENCOUNTER — Ambulatory Visit: Admitting: Family

## 2024-04-26 ENCOUNTER — Telehealth: Payer: Self-pay

## 2024-04-26 NOTE — Progress Notes (Unsigned)
 Complex Care Management Note Care Guide Note  04/26/2024 Name: Cameron Foster. MRN: [id number] DOB: Sep 24, 1940   Complex Care Management Outreach Attempts: A second unsuccessful outreach was attempted today to offer the patient with information about available complex care management services.  Follow Up Plan:  Additional outreach attempts will be made to offer the patient complex care management information and services.   Encounter Outcome:  No Answer-No voicemail  Leotis Rase [facility], [facility] Guide  Direct Dial: [phone]  Fax [phone]

## 2024-04-26 NOTE — Progress Notes (Unsigned)
 Complex Care Management Note Care Guide Note  04/26/2024 Name: Cameron Foster. MRN: [id number] DOB: 03-Jun-1940   Complex Care Management Outreach Attempts: An unsuccessful telephone outreach was attempted today to offer the patient information about available complex care management services.  Follow Up Plan:  Additional outreach attempts will be made to offer the patient complex care management information and services.   Encounter Outcome:  No Answer-No voiceamil  Leotis Rase [facility], [facility] Guide  Direct Dial: [phone]  Fax [phone]

## 2024-04-29 ENCOUNTER — Other Ambulatory Visit: Payer: Self-pay

## 2024-04-29 ENCOUNTER — Other Ambulatory Visit: Payer: Self-pay | Admitting: Family Medicine

## 2024-04-29 ENCOUNTER — Ambulatory Visit: Attending: Physician Assistant

## 2024-04-29 DIAGNOSIS — I3139 Other pericardial effusion (noninflammatory): Secondary | ICD-10-CM | POA: Diagnosis not present

## 2024-04-29 LAB — ECHOCARDIOGRAM LIMITED
Area-P 1/2: 4.06 cm2
Calc EF: 52.7 %
S' Lateral: 4.3 cm
Single Plane A2C EF: 51.1 %
Single Plane A4C EF: 53.6 %

## 2024-04-29 MED ORDER — APIXABAN 2.5 MG PO TABS
2.5000 mg | ORAL_TABLET | Freq: Two times a day (BID) | ORAL | 0 refills | Status: DC
  Filled 2024-04-29: qty 60, 30d supply, fill #0
  Filled 2024-05-02: qty 30, 15d supply, fill #0
  Filled 2024-05-03 – 2024-05-13 (×2): qty 30, 15d supply, fill #1

## 2024-04-29 MED FILL — Torsemide Tab 20 MG: ORAL | 30 days supply | Qty: 60 | Fill #1 | Status: CN

## 2024-04-29 NOTE — Progress Notes (Signed)
 Complex Care Management Note Care Guide Note  04/29/2024 Name: Cameron Foster. MRN: [id number] DOB: 12/03/1940   Complex Care Management Outreach Attempts: A third unsuccessful outreach was attempted today to offer the patient with information about available complex care management services.  Follow Up Plan:  No further outreach attempts will be made at this time. We have been unable to contact the patient to offer or enroll patient in complex care management services.  Encounter Outcome:  No Answer-No voicemail  Leotis Rase [facility], [facility] Guide  Direct Dial: [phone]  Fax [phone]

## 2024-05-02 ENCOUNTER — Ambulatory Visit: Payer: Self-pay | Admitting: Pharmacist

## 2024-05-02 ENCOUNTER — Ambulatory Visit: Admitting: Family Medicine

## 2024-05-02 ENCOUNTER — Encounter: Payer: Self-pay | Admitting: Family Medicine

## 2024-05-02 ENCOUNTER — Other Ambulatory Visit: Payer: Self-pay

## 2024-05-02 VITALS — BP 94/65 | HR 71 | Resp 16 | Ht 70.0 in | Wt 201.0 lb

## 2024-05-02 DIAGNOSIS — R0602 Shortness of breath: Secondary | ICD-10-CM

## 2024-05-02 DIAGNOSIS — N401 Enlarged prostate with lower urinary tract symptoms: Secondary | ICD-10-CM | POA: Diagnosis not present

## 2024-05-02 DIAGNOSIS — I959 Hypotension, unspecified: Secondary | ICD-10-CM

## 2024-05-02 DIAGNOSIS — K219 Gastro-esophageal reflux disease without esophagitis: Secondary | ICD-10-CM

## 2024-05-02 DIAGNOSIS — I5020 Unspecified systolic (congestive) heart failure: Secondary | ICD-10-CM

## 2024-05-02 DIAGNOSIS — I9589 Other hypotension: Secondary | ICD-10-CM | POA: Diagnosis not present

## 2024-05-02 DIAGNOSIS — R3912 Poor urinary stream: Secondary | ICD-10-CM | POA: Diagnosis not present

## 2024-05-02 DIAGNOSIS — I480 Paroxysmal atrial fibrillation: Secondary | ICD-10-CM

## 2024-05-02 MED ORDER — TADALAFIL 5 MG PO TABS
5.0000 mg | ORAL_TABLET | ORAL | 3 refills | Status: AC
  Filled 2024-05-02 – 2024-05-03 (×2): qty 45, 90d supply, fill #0

## 2024-05-02 MED ORDER — KETOCONAZOLE 2 % EX CREA
TOPICAL_CREAM | CUTANEOUS | 5 refills | Status: AC
  Filled 2024-05-02: qty 60, fill #0
  Filled 2024-05-03: qty 15, 30d supply, fill #0

## 2024-05-02 MED ORDER — HYDROCORTISONE 2.5 % EX CREA
TOPICAL_CREAM | CUTANEOUS | 12 refills | Status: AC
  Filled 2024-05-02: qty 30, fill #0
  Filled 2024-05-03: qty 30, 30d supply, fill #0

## 2024-05-02 MED ORDER — TAMSULOSIN HCL 0.4 MG PO CAPS
0.4000 mg | ORAL_CAPSULE | Freq: Every day | ORAL | 1 refills | Status: AC
  Filled 2024-05-02 – 2024-05-03 (×3): qty 90, 90d supply, fill #0
  Filled 2024-05-06: qty 15, 15d supply, fill #0
  Filled 2024-05-15: qty 30, 30d supply, fill #1
  Filled 2024-05-15: qty 15, 15d supply, fill #1
  Filled 2024-05-15: qty 30, 30d supply, fill #1
  Filled ????-??-??: fill #0

## 2024-05-02 MED ORDER — ALBUTEROL SULFATE HFA 108 (90 BASE) MCG/ACT IN AERS
2.0000 | INHALATION_SPRAY | Freq: Four times a day (QID) | RESPIRATORY_TRACT | 2 refills | Status: AC | PRN
  Filled 2024-05-02: qty 8.5, fill #0
  Filled 2024-05-03: qty 6.7, 25d supply, fill #0

## 2024-05-02 NOTE — Progress Notes (Signed)
 "     Acute visit   Patient: Cameron Foster.   DOB: March 17, 1941   84 y.o. Male  MRN: [id number] PCP: Myrla Jon [name], MD   Chief Complaint  Patient presents with   Acute Visit    Pt bp drops after he eats pt also states oxy drops when walking from the bathroom( 80s)    Subjective    Discussed the use of AI scribe software for clinical note transcription with the patient, who gave verbal consent to proceed.  History of Present Illness   Cameron Foster. [name] is an 84 year old male with heart failure who presents with fatigue and shortness of breath.  He reports marked fatigue, limited stamina, and shortness of breath with minimal exertion. His oxygen saturation drops from about 96% at rest to 91% after walking a short distance and to the high 80s after using the bathroom. He notes a tickle in his throat and acknowledges he has not been using his albuterol  inhaler regularly.  He notes his blood pressure often drops after meals and he manages this by drinking cold water  and elevating his feet. He is concerned about fluid weight gain despite improved urinary flow on Flomax .  He recently stopped home health physical therapy and has not maintained exercises. He reports shallow breathing and has started breathing exercises but has low energy to exercise.  He has been using Flomax  with significant improvement in urination but remains worried about fluid retention and weight gain.  His medications include albuterol , Flomax , metoprolol , and omeprazole . He takes a reduced metoprolol  dose of 12.5 mg daily due to low blood pressure. He uses omeprazole  as needed and also takes vitamins for neuropathy and other issues.       Review of Systems   Objective    BP 94/65 (BP Location: Left Arm, Patient Position: Sitting, Cuff Size: Normal)   Pulse 71   Resp 16   Ht 5' 10 (1.778 m)   Wt 201 lb (91.2 kg)   SpO2 96%   BMI 28.84 kg/m   Walking O2 91%  Physical Exam Vitals reviewed.   Constitutional:      General: He is not in acute distress.    Appearance: Normal appearance. He is not diaphoretic.  HENT:     Head: Normocephalic and atraumatic.  Eyes:     General: No scleral icterus.    Conjunctiva/sclera: Conjunctivae normal.  Cardiovascular:     Rate and Rhythm: Normal rate and regular rhythm.     Heart sounds: Normal heart sounds. No murmur heard. Pulmonary:     Effort: Pulmonary effort is normal. No respiratory distress.     Breath sounds: Normal breath sounds. No wheezing or rhonchi.  Musculoskeletal:     Cervical back: Neck supple.     Right lower leg: No edema.     Left lower leg: No edema.  Lymphadenopathy:     Cervical: No cervical adenopathy.  Skin:    General: Skin is warm and dry.     Findings: No rash.  Neurological:     Mental Status: He is alert and oriented to person, place, and time. Mental status is at baseline.  Psychiatric:        Mood and Affect: Mood normal.        Behavior: Behavior normal.       No results found for any visits on 05/02/24.  Assessment & Plan     Problem List Items Addressed This Visit  Cardiovascular and Mediastinum   Paroxysmal atrial fibrillation (HCC)   Managed with anticoagulation to prevent stroke. Discussion about potential future interventions with cardiologist. - Continue Eliquis  for anticoagulation. - Will discuss potential interventions with cardiologist on February 4th.      Relevant Medications   tadalafil  (CIALIS ) 5 MG tablet   Hypotension   Blood pressure drops after eating due to parasympathetic nervous system activation. Symptoms include fatigue and weakness. Cold water  intake and protein drinks have been beneficial. - Continue drinking cold water  before meals. - Continue sitting with feet elevated after meals. - Continue consuming protein drinks as needed.      Relevant Medications   tadalafil  (CIALIS ) 5 MG tablet   Heart failure with recovered ejection fraction (HFrecEF)  (HCC)   Chronic condition with fluid retention and blood pressure fluctuations. Symptoms include fatigue and shortness of breath. Recent echocardiogram was normal EF. Emphasis on fluid and salt intake management. - Continue current heart failure medications. - Monitor fluid and salt intake. - Encouraged regular exercise and breathing exercises to improve stamina.      Relevant Medications   tadalafil  (CIALIS ) 5 MG tablet     Digestive   Gastroesophageal reflux disease without esophagitis   Managed with omeprazole  as needed. - Continue omeprazole  as needed for heartburn and reflux.        Genitourinary   Benign prostatic hyperplasia with weak urinary stream   Symptoms improved with Flomax , which aids in urinary flow and fluid management. - Continue Flomax  as it improves urinary flow.      Relevant Medications   tamsulosin  (FLOMAX ) 0.4 MG CAPS capsule   Other Visit Diagnoses       Postprandial hypotension    -  Primary   Relevant Medications   tadalafil  (CIALIS ) 5 MG tablet     SOB (shortness of breath)               General health maintenance Discussion about medication management and organization. Emphasis on regular exercise and breathing exercises to improve stamina and lung capacity. - Continue regular exercise and breathing exercises. - Organize medications and vitamins for easy access.       Meds ordered this encounter  Medications   tamsulosin  (FLOMAX ) 0.4 MG CAPS capsule    Sig: Take 1 capsule (0.4 mg total) by mouth daily.    Dispense:  90 capsule    Refill:  1   albuterol  (VENTOLIN  HFA) 108 (90 Base) MCG/ACT inhaler    Sig: Inhale 2 puffs into the lungs every 6 (six) hours as needed for wheezing or shortness of breath.    Dispense:  6.7 g    Refill:  2   tadalafil  (CIALIS ) 5 MG tablet    Sig: Take 1 tablet (5 mg total) by mouth every other day.    Dispense:  45 tablet    Refill:  3   hydrocortisone  2.5 % cream    Sig: APPLY TO AFFECTED AREA TWICE A  DAY AS NEEDED FOR RASH    Dispense:  28 g    Refill:  12   ketoconazole  (NIZORAL ) 2 % cream    Sig: Apply once or twice daily to affected skin folds as needed for rash    Dispense:  60 g    Refill:  5     I personally spent a total of 51 minutes in the care of the patient today including preparing to see the patient, getting/reviewing separately obtained history, performing a medically appropriate exam/evaluation, counseling  and educating, placing orders, referring and communicating with other health care professionals, documenting clinical information in the EHR, communicating results, and coordinating care.   No follow-ups on file.      Jon Eva, MD  [facility] [phone] (phone) [phone] (fax)  [facility]  "

## 2024-05-03 ENCOUNTER — Other Ambulatory Visit: Payer: Self-pay | Admitting: Family Medicine

## 2024-05-03 ENCOUNTER — Other Ambulatory Visit (HOSPITAL_COMMUNITY): Payer: Self-pay

## 2024-05-03 ENCOUNTER — Other Ambulatory Visit: Payer: Self-pay

## 2024-05-03 DIAGNOSIS — N401 Enlarged prostate with lower urinary tract symptoms: Secondary | ICD-10-CM | POA: Insufficient documentation

## 2024-05-03 MED ORDER — FOLIC ACID 1 MG PO TABS
1.0000 mg | ORAL_TABLET | Freq: Every day | ORAL | 0 refills | Status: AC
  Filled 2024-05-13: qty 90, 90d supply, fill #0
  Filled 2024-05-15: qty 30, 30d supply, fill #0

## 2024-05-03 MED ORDER — DAPAGLIFLOZIN PROPANEDIOL 10 MG PO TABS
10.0000 mg | ORAL_TABLET | Freq: Every day | ORAL | 0 refills | Status: DC
  Filled 2024-05-06: qty 7, 7d supply, fill #0
  Filled ????-??-??: fill #0

## 2024-05-03 MED FILL — Metoprolol Succinate Tab ER 24HR 25 MG (Tartrate Equiv): ORAL | 30 days supply | Qty: 15 | Fill #0 | Status: CN

## 2024-05-03 MED FILL — Torsemide Tab 20 MG: ORAL | 30 days supply | Qty: 60 | Fill #1 | Status: CN

## 2024-05-03 NOTE — Assessment & Plan Note (Signed)
 Blood pressure drops after eating due to parasympathetic nervous system activation. Symptoms include fatigue and weakness. Cold water  intake and protein drinks have been beneficial. - Continue drinking cold water  before meals. - Continue sitting with feet elevated after meals. - Continue consuming protein drinks as needed.

## 2024-05-03 NOTE — Assessment & Plan Note (Signed)
 Managed with anticoagulation to prevent stroke. Discussion about potential future interventions with cardiologist. - Continue Eliquis  for anticoagulation. - Will discuss potential interventions with cardiologist on February 4th.

## 2024-05-03 NOTE — Assessment & Plan Note (Signed)
 Managed with omeprazole  as needed. - Continue omeprazole  as needed for heartburn and reflux.

## 2024-05-03 NOTE — Assessment & Plan Note (Signed)
 Chronic condition with fluid retention and blood pressure fluctuations. Symptoms include fatigue and shortness of breath. Recent echocardiogram was normal EF. Emphasis on fluid and salt intake management. - Continue current heart failure medications. - Monitor fluid and salt intake. - Encouraged regular exercise and breathing exercises to improve stamina.

## 2024-05-03 NOTE — Assessment & Plan Note (Signed)
 Symptoms improved with Flomax , which aids in urinary flow and fluid management. - Continue Flomax  as it improves urinary flow.

## 2024-05-05 ENCOUNTER — Other Ambulatory Visit: Payer: Self-pay

## 2024-05-06 ENCOUNTER — Other Ambulatory Visit: Payer: Self-pay

## 2024-05-06 MED FILL — Metoprolol Succinate Tab ER 24HR 25 MG (Tartrate Equiv): ORAL | 60 days supply | Qty: 30 | Fill #0 | Status: CN

## 2024-05-11 ENCOUNTER — Other Ambulatory Visit: Payer: Self-pay

## 2024-05-11 MED FILL — Metoprolol Succinate Tab ER 24HR 25 MG (Tartrate Equiv): ORAL | 30 days supply | Qty: 15 | Fill #0 | Status: AC

## 2024-05-11 MED FILL — Metoprolol Succinate Tab ER 24HR 25 MG (Tartrate Equiv): ORAL | 30 days supply | Qty: 15 | Fill #0 | Status: CN

## 2024-05-13 ENCOUNTER — Other Ambulatory Visit (HOSPITAL_COMMUNITY): Payer: Self-pay

## 2024-05-13 ENCOUNTER — Other Ambulatory Visit: Payer: Self-pay

## 2024-05-13 MED FILL — Torsemide Tab 20 MG: ORAL | 30 days supply | Qty: 60 | Fill #1 | Status: CN

## 2024-05-14 ENCOUNTER — Other Ambulatory Visit: Payer: Self-pay

## 2024-05-15 ENCOUNTER — Other Ambulatory Visit: Payer: Self-pay

## 2024-05-15 ENCOUNTER — Other Ambulatory Visit: Payer: Self-pay | Admitting: Family Medicine

## 2024-05-15 MED ORDER — APIXABAN 2.5 MG PO TABS
2.5000 mg | ORAL_TABLET | Freq: Two times a day (BID) | ORAL | 1 refills | Status: AC
  Filled 2024-05-15: qty 60, 30d supply, fill #0
  Filled 2024-05-15: qty 180, 90d supply, fill #0

## 2024-05-15 MED ORDER — DAPAGLIFLOZIN PROPANEDIOL 10 MG PO TABS
10.0000 mg | ORAL_TABLET | Freq: Every day | ORAL | 1 refills | Status: AC
  Filled 2024-05-15: qty 30, 30d supply, fill #0
  Filled 2024-05-15: qty 90, 90d supply, fill #0

## 2024-05-15 MED ORDER — OMEPRAZOLE 20 MG PO CPDR
20.0000 mg | DELAYED_RELEASE_CAPSULE | Freq: Every day | ORAL | 2 refills | Status: AC | PRN
  Filled 2024-05-15: qty 90, 90d supply, fill #0

## 2024-05-15 MED ORDER — MIDODRINE HCL 5 MG PO TABS
5.0000 mg | ORAL_TABLET | Freq: Three times a day (TID) | ORAL | 11 refills | Status: AC
  Filled 2024-05-15: qty 90, 30d supply, fill #0

## 2024-05-15 MED FILL — Torsemide Tab 20 MG: ORAL | 30 days supply | Qty: 60 | Fill #1 | Status: CN

## 2024-05-15 MED FILL — Torsemide Tab 20 MG: ORAL | 30 days supply | Qty: 60 | Fill #1 | Status: AC

## 2024-05-15 NOTE — Progress Notes (Signed)
 [facility] New Consult Visit  Patient Name: Cameron Foster., male   Patient DOB: November 28, 1940 Date of Service: 05/15/2024  Patient MRN: [id number] Provider Creating Note: Woodward Brought, MD  [phone] Primary Care Physician: Cameron Jon [name], MD  [address] Cameron Foster [id number] Additional Physicians/ Providers:    Impression/Recommendations   Mr. Cameron Foster. Is a 84 y.o. male with hypertension, atrial fibrillation, congestive heart failure, OSA, hyperlipidemia and history of prostate cancer presents as a new patient for evaluation of chronic kidney disease stage IIIB. Creatinine 2.32, GFR of 27.   Chronic Kidney Disease stage IIIB: with concerns of progression to chronic kidney disease stage IV. No history of diabetes.  - not currently on an ACE-I/ARB - continue dapagliflozin  - not currently on a mineralocorticoid receptor antagonist - avoid nonsteroidal anti-inflammatory agents  Hypotension with history of Hypertension with chronic kidney disease: with history of congestive heart failure.  - current regimen of tamsulosin , torsemide  and metoprolol  - discontinue metoprolol  - start midodrine  5mg  tid.   Hematuria:  - may need urologic work up.   Anemia with chronic kidney disease: with iron  deficiency on 02/29/24.  - Check CBC  Hypokalemia: on torsemide .  - potassium chloride .   Hyperlipidemia:  - atorvastatin   Problem List Patient Active Problem List  Diagnosis   Chronic kidney disease stage 3B (HCC)   Anemia in chronic kidney disease   Congestive heart failure (HCC)   Hyperlipidemia   Hematuria   Hypotension    Orders Placed This Encounter  Procedures   Ultrasound renal complete    Standing Status:   Future    Number of Occurrences:   1    Expected Date:   05/15/2024    Expiration Date:   05/15/2025   CBC and Differential    Standing Status:   Future    Number of Occurrences:   1    Expected Date:   05/15/2024     Expiration Date:   06/15/2025   Kappa/Lambda free LT chains w/ratio, Serum    Standing Status:   Future    Number of Occurrences:   1    Expected Date:   05/15/2024    Expiration Date:   06/15/2025   Hepatitis B Core Antibody, Total    Standing Status:   Future    Number of Occurrences:   1    Expected Date:   05/15/2024    Expiration Date:   06/15/2025   Hepatitis B Surface Antigen    Standing Status:   Future    Number of Occurrences:   1    Expected Date:   05/15/2024    Expiration Date:   06/15/2025   Hepatitis C antibody    Standing Status:   Future    Number of Occurrences:   1    Expected Date:   05/15/2024    Expiration Date:   06/15/2025   Magnesium     Standing Status:   Future    Number of Occurrences:   1    Expected Date:   05/15/2024    Expiration Date:   06/15/2025   Protein electrophoresis, serum    Standing Status:   Future    Number of Occurrences:   1    Expected Date:   05/15/2024    Expiration Date:   06/15/2025   Protein Electrophoresis, Urine Random    Standing Status:   Future    Number of Occurrences:   1  Expected Date:   05/15/2024    Expiration Date:   06/15/2025   PTH, Intact    Standing Status:   Future    Number of Occurrences:   1    Expected Date:   05/15/2024    Expiration Date:   06/15/2025   Renal Function Panel    Standing Status:   Future    Number of Occurrences:   1    Expected Date:   05/15/2024    Expiration Date:   06/15/2025   POCT Urinalysis Auto w Scope   Problem List Items Addressed This Visit     Chronic kidney disease stage 3B (HCC) - Primary   Anemia in chronic kidney disease (Chronic)   Congestive heart failure (HCC)   Hyperlipidemia   Hematuria   Hypotension   Other Visit Diagnoses       Abnormal urine          Orders Placed This Encounter   Ultrasound renal complete   CBC and Differential   Kappa/Lambda free LT chains w/ratio, Serum   Hepatitis B Core Antibody, Total   Hepatitis B Surface Antigen    Hepatitis C antibody   Magnesium    Protein electrophoresis, serum   Protein Electrophoresis, Urine Random   PTH, Intact   Renal Function Panel   POCT Urinalysis Auto w Scope   midodrine  (PROAMATINE ) 5 MG tablet       Return in about 6 weeks (around 06/26/2024).   History of Present Illness   Chief Complaint  Patient presents with   New     Mr. Cameron Foster. is a 84 y.o. 1-Gee male who is being evaluated as a new patient today for chronic kidney disease stage IIIB. Patient presents with his sister who assists with history taking.   Patient presented to the ED on 12/23 for hypotension. He was asked to reduced his torsemide  dose.   Patient was admitted to [facility] from 11/13 to 11/25 for acute exacerbation of chronic systolic congestive heart failure. Patient's hospital course was complicated by anemia, hypotension and pericardial effusion. His effusion was treated with prednisone  taper. Patient received 1 unit PRBC during his hospitalization. Discharged on torsemide , dapagliflozin  and metoprolol .   The following portions of the patient's chart were reviewed in this encounter and updated as appropriate:  Allergies  Meds  Problems  Med Hx  Surg Hx  Fam Hx        Urine Studies   05/15/2024: urine microscopy: bland  History    Medications   Current Outpatient Medications:    albuterol  HFA (PROVENTIL  HFA;VENTOLIN  HFA) 108 (90 Base) MCG/ACT inhaler, TAKE 2 PUFFS BY MOUTH EVERY 6 HOURS AS NEEDED FOR WHEEZE OR SHORTNESS OF BREATH, Disp: , Rfl:    amiodarone  (PACERONE ) 200 MG tablet, Take 100 mg by mouth 1 (one) time each day, Disp: , Rfl:    apixaban  (ELIQUIS ) 2.5 MG tablet, Take 1 tablet (2.5 mg total) by mouth 2 (two) times daily., Disp: , Rfl:    atorvastatin  (LIPITOR) 40 MG tablet, Take 40 mg by mouth 1 (one) time each day, Disp: , Rfl:    Dapagliflozin  Propanediol 10 MG tablet, Take 10 mg by mouth 1 (one) time each day, Disp: , Rfl:    Docusate Sodium   (DSS) 100 MG capsule, Take 1 capsule (100 mg total) by mouth 2 (two) times daily., Disp: , Rfl:    folic acid  (FOLVITE ) 1 MG tablet, Take 1,000 mcg by mouth 1 (one) time each day,  Disp: , Rfl:    hydrocortisone  2.5 % cream, APPLY TO AFFECTED AREA TWICE A DAY AS NEEDED FOR RASH, Disp: , Rfl:    ketoconazole  (NIZORAL ) 2 % cream, APPLY ONCE OR TWICE DAILY TO AFFECTED SKIN FOLDS AS NEEDED FOR RASH, Disp: , Rfl:    omeprazole  (PriLOSEC) 20 MG DR capsule, Take 20 mg by mouth if needed, Disp: , Rfl:    potassium chloride  (KLOR-CON  M20) 20 MEQ CR tablet, Take 20 mEq by mouth 1 (one) time each day, Disp: , Rfl:    sertraline  (ZOLOFT ) 100 MG tablet, Take 100 mg by mouth 1 (one) time each day, Disp: , Rfl:    tadalafil  (CIALIS ) 5 MG tablet, TAKE 1 TABLET (5 MG TOTAL) BY MOUTH EVERY OTHER DAY AS NEEDED FOR ERECTILE DYSFUNCTION., Disp: , Rfl:    tamsulosin  (FLOMAX ) 0.4 MG 24 hr capsule, Take 1 capsule (0.4 mg total) by mouth daily., Disp: , Rfl:    torsemide  (DEMADEX ) 20 MG tablet, Take 40 mg by mouth, Disp: , Rfl:    cetirizine  (ZyrTEC ) 10 MG tablet, Take 10 mg by mouth 1 (one) time each day, Disp: , Rfl:    Cholecalciferol  (Vitamin D-3) 25 MCG (1000 UT) capsule, Take 1,000 Units by mouth 1 (one) time each day, Disp: , Rfl:    cyanocobalamin  (VITAMIN B-12) 1000 MCG tablet, Take 1,000 mcg by mouth 1 (one) time each day, Disp: , Rfl:    dextromethorphan -guaiFENesin  (MUCINEX  DM) 30-600 MG per 12 hr tablet, Take 1 tablet by mouth 2 (two) times daily as needed for cough., Disp: , Rfl:    midodrine  (PROAMATINE ) 5 MG tablet, Take 1 tablet (5 mg total) by mouth in the morning and 1 tablet (5 mg total) in the evening and 1 tablet (5 mg total) before bedtime., Disp: 90 tablet, Rfl: 11   thiamine  (VITAMIN B-1) 100 MG tablet, Take 100 mg by mouth 1 (one) time each day, Disp: , Rfl:    zinc  gluconate 50 MG tablet, Take 50 mg by mouth 1 (one) time each day, Disp: , Rfl:    Allergies Amoxicillin -pot  clavulanate and Formaldehyde  History Past Medical History:  Diagnosis Date   Alcohol abuse    Anemia in chronic kidney disease    Atrial fibrillation (HCC)    Carotid artery occlusion    Chronic kidney disease stage 3B (HCC)    Congestive heart failure (HCC)    Hematuria    Hyperlipidemia    Malignant neoplasm of prostate (HCC)    Obstructive sleep apnea syndrome    Subarachnoid hemorrhage (HCC)    Tobacco use     Past Surgical History:  Procedure Laterality Date   CATARACT EXTRACTION, BILATERAL     COLOSTOMY     FRACTURE SURGERY     right arm   FRACTURE SURGERY     left wrist   HIP ARTHROPLASTY Right    REVISION / TAKEDOWN COLOSTOMY     TONSILLECTOMY     TOTAL KNEE ARTHROPLASTY     Family History  Problem Relation Age of Onset   Stroke Mother    Hypertension Mother    Cancer Father    Hypertension Sister    Cancer Sister    Social History   Tobacco Use   Smoking status: Never   Smokeless tobacco: Former  Substance Use Topics   Alcohol use: Yes    Alcohol/week: 2.0 - 6.0 standard drinks of alcohol    Types: 2 - 6 Standard drinks or equivalent per  week        Physical Exam  Vitals BP (!) 85/44 (BP Location: Left upper arm, Patient Position: Sitting)   Pulse 72   Temp 97.6 F   Ht 5' 8 (1.727 m)   Wt 198 lb (89.8 kg)   SpO2 92%   BMI 30.11 kg/m   Vitals reviewed. Constitutional: He is oriented to person, place, and time. He appears well-developed and wheelchair bound.  HEENT:  Head: Normocephalic and atraumatic. Mouth/Throat: Oropharynx is clear and moist.  Eyes: Pupils are equal, round, and reactive to light.  Neck: Neck supple.  Cardiovascular:  Normal rate and regular rhythm.           Pulmonary/Chest: Effort normal and breath sounds normal.  Abdominal: Soft.  Neurological: He is alert and oriented to person, place, and time.  Skin: Skin is warm and dry.     Laboratory Studies  Chemistry  Lab Units  05/15/24 1357  ALBUMIN   30 mg/L        No lab exists for component: IRON  SATURATION, TRANSSATPER      Urine  Lab Units 05/15/24 1357  COLOR UA  Yellow  CLARITY UA  Clear  KETONES UA  Negative  PH UA  5.5  UROBILINOGEN UA  0.2        Woodward Brought, MD

## 2024-05-16 ENCOUNTER — Other Ambulatory Visit: Payer: Self-pay | Admitting: Nephrology

## 2024-05-16 ENCOUNTER — Other Ambulatory Visit: Payer: Self-pay

## 2024-05-16 DIAGNOSIS — N1832 Chronic kidney disease, stage 3b: Secondary | ICD-10-CM

## 2024-05-16 DIAGNOSIS — D638 Anemia in other chronic diseases classified elsewhere: Secondary | ICD-10-CM

## 2024-05-16 DIAGNOSIS — R319 Hematuria, unspecified: Secondary | ICD-10-CM

## 2024-05-16 DIAGNOSIS — R8290 Unspecified abnormal findings in urine: Secondary | ICD-10-CM

## 2024-05-17 ENCOUNTER — Ambulatory Visit: Admitting: Physician Assistant

## 2024-05-17 ENCOUNTER — Ambulatory Visit: Attending: Physician Assistant | Admitting: Physician Assistant

## 2024-05-17 ENCOUNTER — Encounter: Payer: Self-pay | Admitting: Physician Assistant

## 2024-05-17 VITALS — BP 112/62 | HR 70 | Ht 68.0 in | Wt 208.8 lb

## 2024-05-17 DIAGNOSIS — N1832 Chronic kidney disease, stage 3b: Secondary | ICD-10-CM | POA: Diagnosis not present

## 2024-05-17 DIAGNOSIS — I5020 Unspecified systolic (congestive) heart failure: Secondary | ICD-10-CM

## 2024-05-17 DIAGNOSIS — I4819 Other persistent atrial fibrillation: Secondary | ICD-10-CM

## 2024-05-17 DIAGNOSIS — E785 Hyperlipidemia, unspecified: Secondary | ICD-10-CM | POA: Diagnosis not present

## 2024-05-17 DIAGNOSIS — G4733 Obstructive sleep apnea (adult) (pediatric): Secondary | ICD-10-CM | POA: Diagnosis not present

## 2024-05-17 DIAGNOSIS — Z8719 Personal history of other diseases of the digestive system: Secondary | ICD-10-CM | POA: Diagnosis not present

## 2024-05-17 DIAGNOSIS — I959 Hypotension, unspecified: Secondary | ICD-10-CM

## 2024-05-17 DIAGNOSIS — D638 Anemia in other chronic diseases classified elsewhere: Secondary | ICD-10-CM | POA: Diagnosis not present

## 2024-05-17 NOTE — Progress Notes (Signed)
 "  Cardiology Office Note    Date:  05/17/2024   ID:  Cameron Hockett., DOB [birth year], MRN [id number]  PCP:  Myrla Jon [name], MD  Cardiologist:  Lonni Hanson, MD  Electrophysiologist:  Fonda Kitty, MD   Chief Complaint: Follow up  History of Present Illness:   Cameron Baeten. is a 84 y.o. male with history of chronic HFimpEF, paroxysmal atrial fibrillation, hypertension, hyperlipidemia, OSA, CVA, subarachnoid hemorrhage, CKD stage IIIa, tobacco use, colitis with GI bleeding, prostate cancer, and alcohol use who presents for follow up on CHF and atrial fibrillation.    Patient was admitted to the hospital 06/2021 with left-sided colitis thought to be ischemic versus infectious.  Flexible sigmoidoscopy showed possible ischemic colitis and he was treated with antibiotics.  This admission was complicated by hypotension and atrial fibrillation with RVR.  Troponin minimally elevated.  BNP 569.  Echo at that time demonstrated EF 45 to 50%, global hypokinesis, moderate concentric LVH, elevated LVEDP, mild LAE, mild MR, and mildly dilated aortic root and ascending aorta measuring 41 and 39 mm respectively.  He was started on IV amiodarone  for rate control which led to pharmacological cardioversion.  He was also started on Eliquis  at that time.  He was seen in the office 07/2021 for hospital follow-up and denied any further GI bleeding.  He expressed concerns for continuing anticoagulation secondary to bleeding problems.  He underwent outpatient cardiac monitoring which showed no evidence of atrial fibrillation and Eliquis  was discontinued.  He was hospitalized from 9/3 to 12/24/2023 with A-fib with RVR requiring Cardizem  infusion. Echo at that time showed LVEF  55-60% without RWMA, mild LVH, diastolic parameters were indeterminate. He was discharged on Eliquis  with plans to perform cardioversion in 3 weeks if persistent A-fib. His stay was complicated by bradycardia and sinus pauses for which  initial Cardizem  dose of 240 was decreased to 120 in addition to metoprolol  tartrate 75 mg twice daily.    Patient presented to ED again 01/01/2024 with dull chest pain.  He was found to be in atrial fibrillation with RVR, rate up to 130s bpm.  He underwent TEE guided cardioversion 9/17 with successful conversion to sinus rhythm.  He was started on oral amiodarone  load.  TEE showed EF 30 to 35% with global hypokinesis.  GDMT was initiated with Toprol -XL and losartan .  Patient was discharged 01/04/2024 in stable condition.   He had a follow-up with advanced heart failure clinic in [facility] 01/09/2024 and reported feeling lousy with shortness of breath on minimal exertion.  He reported an episode of about 30 minutes with drowsiness and slurred speech.  EKG showed that he was back in atrial fibrillation.  Weight was up 4 pounds since hospital admission 4 days prior.  He was given Furoscix  1 dose and started on Lasix  40 mg daily.  He was scheduled for DCCV which was completed 10/1 with successful conversion to sinus rhythm.  Lasix  was increased to 80 mg daily.  He was seen again in follow-up with the advanced heart failure clinic in [facility] 10/7 and reported ongoing dyspnea with associated fatigue, edema, cough, and generalized weakness. He was maintaining sinus rhythm per EKG.  Weight was stable.  He was referred to pulmonology for OSA due to difficulty with CPAP.  Follow-up labs with creatinine noted to be elevated.  Losartan  was subsequently discontinued.  Patient was seen in the cardiology clinic 01/2024 for hospital follow-up reporting improvement in exertional dyspnea since recent cardioversion, although he still required  assistance with ADLs due to dyspnea.  He endorsed ongoing lower extremity swelling, abdominal fullness, and orthopnea.  He believes his dry weight was near 205 pounds and did not feel like furosemide  was causing him to urinate more than if he was not taking it.  He had started to wear his  CPAP nightly with improvement in sleep quality.  He was transitioned from Lasix  to torsemide  40 mg daily and started on SGLT2 inhibitor.  He followed up with Dr. Zenaida at the advanced heart failure clinic 02/13/2024 and reported feeling pretty miserable.  He endorsed shortness of breath with minimal exertion, significant lower extremity swelling, and worsening blood pressure over the past few days.  He had recently stopped taking Jardiance  after he noticed his blood pressure dropping after taking it.  Torsemide  was increased to 40 mg twice daily and he was recommended to resume Jardiance .  Metoprolol  was reduced to 50 mg daily.  He was seen in follow-up with EP 02/2024 and reported ongoing concern for low blood pressure for which she had been holding some of his medications intermittently, including torsemide .  He was noted to be volume overloaded and it was recommended that he proceed to the ED for further management of symptoms.  He was admitted 03/01/2024 with proBNP elevated at 1845.  Creatinine was 2.37, up from baseline around 1.3-1.8.  He globin was noted to be downtrending at 8.2.  Chest x-ray was without acute cardiopulmonary disease.  EKG showed sinus rhythm with RBBB and first-degree AV block with nonspecific ST/T wave abnormalities.  Echo showed EF improved at 60 to 65% with moderate pericardial effusion, no evidence of tamponade.  Eliquis  was held due to concern for possible hemopericardium.  Repeat limited echo showed stable small to moderate-sized pericardial effusion without evidence of tamponade.  GDMT was held given low blood pressure and he was aggressively diuresed with net -6.5 L.  Kidney function improved and he was resumed on metoprolol  succinate 12.5 mg daily, Farxiga  10 mg daily, and oral torsemide  40 mg daily.  Received blood transfusion on 11/19.  Repeat limited echo 11/22 showed stable moderate pericardial effusion with no evidence of tamponade.  Given IV iron  on 11/24.  Eliquis  was  resumed prior to discharge on 03/12/2024.     Patient was most recently seen in the cardiology clinic by myself 03/26/2024 reporting overall feeling much better from a cardiac perspective.  He endorsed doing a lot of walking prior to his appointment due to having another appointment elsewhere.  He denied any dyspnea on exertion but did feel somewhat swimmy headed.  He thought this may be secondary to his sinuses.  His weights and blood pressure have been stable at home.  He endorsed some lightheadedness with position changes.  No medication changes were made.  He was referred to EP to discuss possible watchman placement given significant anemia on anticoagulation.  Repeat limited echo 04/29/2024 revealed EF of 55 to 60% with no evidence of pericardial effusion.  Patient was seen in the ED 04/09/2024 with complaints of transient hypotension. He endorsed low BP readings and lightheadedness. Cr was up slightly from baseline at 2.32. Troponin mildly elevated and flat trending. EKG without acute ischemic changes. CXR without acute cardiopulmonary changes. He was instructed to reduce his dose of torsemide  by half. Seen by nephrology 05/15/2024 with BP noted to be low. Metoprolol  was discontinued and he was started on torsemide  5 mg three times daily.   Patient presents today accompanied by his sister. He reports doing  overall well from a cardiac perspective. He continues to have intermittent symptomatic hypotension throughout the day, especially after eating. His PCP recommended eating smaller meals, drinking a large glass of cold water , and elevating legs after eating which has helped some. He has not yet started taking midodrine  as he was just able to pick it up from the pharmacy. His sister has put it in his pill box to start this upcoming Sunday. He continues to have dyspnea on exertion. He is frustrated that his functional capacity is not what it used to be. He does not get much physical activity or structured  exercise. His sister reports that he continues to eat high sodium foods because he does not know what foods are high in sodium. Weight has been overall stable at home and he denies lower extremity swelling and abdominal fullness. He denies chest pain and palpitations.   Labs independently reviewed: 03/2024-Hgb 10.8, HCT 34.7, platelets 189, sodium 138, potassium 4.1, BUN 33, creatinine 2.32 12/2023- TC 154, TG 167, HDL 53, LDL 68   Objective   Past Medical History:  Diagnosis Date   Alcohol dependence (HCC) 09/30/2021   Alcoholism (HCC)    Allergy    Aortic atherosclerosis    Arthritis    Ascending aorta dilation 03/28/2018   a.) Vascular US : prox asc Ao measured 29 mm. b.)  CT CAP 06/22/2021: Ao root 41 mm. c.) TTE 06/26/2021: Ao root 41 mm; asc Ao 39 mm   Atrial fibrillation (HCC) 1995   a.) single episode in 1995 per patient; no long term treatment. b.) recurrent episode in the setting of GI bleeding related to colitis 06/2021.   Basal cell carcinoma 04/26/2017   Right medial cheek. Superficial and nodular   Basal cell carcinoma 06/11/2019   Left anterior shoulder. Nodular pattern   Basal cell carcinoma 09/09/2019   Right nasal ala, [facility]   Basal cell carcinoma 02/05/2020   L upper eyebrow, [facility]    Basal cell carcinoma 10/12/2022   left post auricular, [facility]   Basal cell carcinoma 05/02/2023   Right medial cheek, [facility]   Bilateral carotid artery stenosis 06/09/2019   a.) Carotid doppler: mod; <50% BILATERAL ICAs.   Cataract    CHF (congestive heart failure) (HCC)    Chicken pox    Colon cancer (HCC)    Colon polyp    Depression    Son died 06-18-14   Diverticulitis    Diverticulosis 30 years   Encounter for other preprocedural examination 06/21/2021   Gastritis    GERD (gastroesophageal reflux disease)    H. pylori infection    History of stress test    a. 09/2015 MV: No ischemia/infarct. EF 45-54% (nl by echo).   Hyperlipidemia    Hypertension    Irritable bowel syndrome     Lacunar infarction (HCC) 06/08/2019   a.) small; RIGHT motor strip   Myocardial infarction [facility])    Neuromuscular disorder (HCC)    NSVT (nonsustained ventricular tachycardia) (HCC)    a.)  Single episode lasting 5 beats at a maximum rate of 160 bpm noted on Holter study performed 08/13/2021.   Prostate cancer (HCC) 06-18-10   a.) s/p XRT   PSVT (paroxysmal supraventricular tachycardia)    a. 06/2019 Zio: Avg rate 74 (54-120), occas PACs, rare PVCs, 125 episodes of PVCs (longest 17.5 secs; max rate 187). No afib.   RBBB    SAH (subarachnoid hemorrhage) (HCC) 10/10/2018   Sleep apnea    Squamous cell carcinoma of skin  07/18/2017   Left medial calf. KA type   Squamous cell carcinoma of skin 04/24/2018   Right above med. brow   Squamous cell carcinoma of skin 06/11/2019   Right posterior shoulder. SCCis, hypertrophic   Squamous cell carcinoma of skin 01/09/2020   Mid nasal dorsum, MOHS, Efudex x 4wks   Stroke [facility])    Substance abuse (HCC)    Systolic dysfunction    a.) TTE 10/05/2015: EF 50-55%; mild LVH; LAE; triv AR, mild MR. b.) TTE 03/29/2018: EF 55-60%; LAE, mild AR; ? small PFO. c.) TTE 06/09/2019: EF 55-60%, no rwma, triv MR/AI. d.) TTE 06/26/2021: EF 45-50%; glob HK; LAE; mild MR; Ao root 41 mm; asc Ao 39 mm.   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  Wine, Beer or Liquor.   Drug use: No   Sexual activity: Not Currently    Birth control/protection: None    Comment: Married  Other Topics Concern   Not on file  Social History Narrative   1 son deceased, 1 daughter living   Social Drivers of Health   Tobacco Use: High Risk (05/17/2024)   Patient History    Smoking Tobacco Use: Former    Smokeless Tobacco Use: Current    Passive Exposure: Not on Actuary Strain: Low Risk (01/28/2024)   Overall Financial Resource Strain (CARDIA)    Difficulty of Paying Living Expenses: Not very hard  Food Insecurity: No Food Insecurity (03/19/2024)   Epic    Worried About Radiation Protection Practitioner of Food in the Last Year: Never true    Ran Out of Food in the Last Year: Never true  Transportation Needs: No Transportation Needs (03/19/2024)   Epic    Lack of Transportation (Medical): No    Lack of Transportation (Non-Medical): No  Physical Activity: Inactive (01/28/2024)   Exercise Vital Sign    Days of Exercise per Week: 0 days    Minutes of Exercise per Session: Not on file  Stress: No Stress Concern Present (01/28/2024)   [employer] - Occupational Stress Questionnaire    Feeling of Stress: Only a little  Recent Concern: Stress - Stress Concern Present (11/24/2023)   [employer] - Occupational Stress Questionnaire    Feeling of Stress: To some extent  Social Connections: Moderately Integrated (03/01/2024)   Social Connection and Isolation Panel    Frequency of Communication with Friends and Family: Three times  a week    Frequency of Social Gatherings with Friends and Family: Twice a week    Attends Religious Services: More than 4  times per year    Active Member of [employer] or Organizations: Yes    Attends Banker Meetings: More than 4 times per year    Marital Status: Widowed  Depression (PHQ2-9): Medium Risk (05/02/2024)   Depression (PHQ2-9)    PHQ-2 Score: 7  Alcohol Screen: Medium Risk (01/28/2024)   Alcohol Screen    Last Alcohol Screening Score (AUDIT): 9  Housing: Low Risk (03/19/2024)   Epic    Unable to Pay for Housing in the Last Year: No    Number of Times Moved in the Last Year: 0    Homeless in the Last Year: No  Utilities: Not At Risk (03/19/2024)   Epic    Threatened with loss of utilities: No  Health Literacy: Adequate Health Literacy (05/31/2023)   B1300 Health Literacy    Frequency of need for help with medical instructions: Never     Family History:  The patient's family history includes Bipolar disorder in his son; Cancer in his father; Early death in his son; Heart disease in his son; Kidney disease in his daughter; Learning disabilities in his son; Lung cancer in his father; Other in his mother; Sudden death in his son; Varicose Veins in his sister; Vision loss in his mother. There is no history of Prostate cancer or Bladder Cancer.  ROS:   12-point review of systems is negative unless otherwise noted in the HPI.  EKGs/Other Studies Reviewed:    Studies reviewed were summarized above. The additional studies were reviewed today:  04/29/2024 Echo limited 1. Left ventricular ejection fraction, by estimation, is 55 to 60%. The  left ventricle has normal function. The left ventricle has no regional  wall motion abnormalities. Left ventricular diastolic parameters were  normal.   2. Right ventricular systolic function is normal.   3. The mitral valve is degenerative. No evidence of mitral valve  regurgitation.   4. The aortic valve is tricuspid. Aortic valve regurgitation is trivial.  Aortic valve sclerosis is present, with no evidence of aortic valve  stenosis.   03/09/2024  Echo limited 1. Left ventricular ejection fraction, by estimation, is 55 to 60%. The  left ventricle has normal function. There is moderate left ventricular  hypertrophy.   2. Right ventricular systolic function is normal. The right ventricular  size is normal.   3. Moderate pericardial effusion. There is no evidence of cardiac  tamponade. No RV diastolic collapse or significant mitral/tricuspid inflow  variation present.   4. The inferior vena cava is dilated in size with <50% respiratory  variability, suggesting right atrial pressure of 15 mmHg.    03/03/2024 Echo limited 1. Left ventricular ejection fraction, by estimation, is 60 to 65%. The  left ventricle has normal function. The left ventricle has no regional  wall motion abnormalities. There is mild concentric left ventricular  hypertrophy.   2. Right ventricular systolic function is normal.   3. Pericardial effusion is unchanged in size from 03/01/24. There is  hyperechoic densities anterior to the right ventricle unchanged from prior  concerning for thrombus or fibrinous material. No evidence of tamponade.  Moderate pericardial effusion. The  pericardial effusion is circumferential. There is no evidence of cardiac  tamponade.   4. The inferior vena cava is normal in size with greater than 50%  respiratory variability, suggesting right atrial pressure of 3  mmHg.    03/01/2024 Echo limited 1. Left ventricular ejection fraction, by estimation, is 60 to 65%. The  left ventricle has normal function. Left ventricular endocardial border  not optimally defined to evaluate regional wall motion. There is moderate  left ventricular hypertrophy. Left  ventricular diastolic parameters are indeterminate.   2. Right ventricular systolic function is normal. The right ventricular  size is normal. Tricuspid regurgitation signal is inadequate for assessing  PA pressure.   3. Moderate pericardial effusion. The pericardial effusion is   circumferential. No RV diastolic collapse visualized. Mitral and tricuspid  inflow not performed.   4. The inferior vena cava is dilated in size with <50% respiratory  variability, suggesting right atrial pressure of 15 mmHg.    12/2023 TEE 1. Left ventricular ejection fraction, by estimation, is 30 to 35%. The  left ventricle has moderately decreased function. The left ventricle  demonstrates global hypokinesis.   2. Right ventricular systolic function is normal. The right ventricular  size is normal.   3. Left atrial size was severely dilated. No left atrial/left atrial  appendage thrombus was detected.   4. The mitral valve is normal in structure. Mild mitral valve  regurgitation. No evidence of mitral stenosis.   5. The aortic valve is tricuspid. Aortic valve regurgitation is not  visualized. No aortic stenosis is present.   6. There is mild (Grade II) atheroma plaque involving the aortic arch and  descending aorta.   7. The inferior vena cava is normal in size with greater than 50%  respiratory variability, suggesting right atrial pressure of 3 mmHg.   8. Agitated saline contrast bubble study was positive with shunting  observed within 3-6 cardiac cycles suggestive of interatrial shunt. There  is a small patent foramen ovale with predominantly left to right shunting  across the atrial septum.   EKG:  EKG personally reviewed by me today EKG Interpretation Date/Time:  Friday May 17 2024 13:45:59 EST Ventricular Rate:  70 PR Interval:  242 QRS Duration:  142 QT Interval:  446 QTC Calculation: 481 R Axis:   -53  Text Interpretation: Sinus rhythm with 1st degree A-V block Right bundle branch block Left anterior fascicular block Nonspecific T wave abnormality Confirmed by Lorene Sinclair ([id number]) on 05/17/2024 1:53:07 PM  PHYSICAL EXAM:    VS:  BP 112/62 (BP Location: Left Arm, Patient Position: Sitting, Cuff Size: Normal)   Pulse 70   Ht 5' 8 (1.727 m)   Wt 208 lb 12.8 oz  (94.7 kg)   SpO2 94%   BMI 31.75 kg/m   BMI: Body mass index is 31.75 kg/m.  GEN: Well nourished, well developed in no acute distress NECK: No JVD; No carotid bruits CARDIAC: RRR, no murmurs, rubs, gallops RESPIRATORY:  Clear to auscultation without rales, wheezing or rhonchi  ABDOMEN: Soft, non-tender, non-distended EXTREMITIES: Trace bilateral LE edema; No deformity  Wt Readings from Last 3 Encounters:  05/17/24 208 lb 12.8 oz (94.7 kg)  05/02/24 201 lb (91.2 kg)  04/08/24 193 lb (87.5 kg)                 Cardiac Rehabilitation Eligibility Assessment  The patient is ready to start cardiac rehabilitation from a cardiac standpoint.    ASSESSMENT & PLAN:   Chronic HFimpEF Hypotension - Most recent echo 04/29/2024 revealed EF 55-60% with normal diastolic parameters, improved from 30-35% in 12/2023. He appears relatively euvolemic on exam. Continues to endorse dyspnea on exertion, likely secondary to deconditioning. BP has been  running low with recent ED visit last month for hypotension. Seen by nephrology yesterday and was instructed to discontinue metoprolol  and start midodrine  5 mg three times daily. He has not yet started midodrine . He had lab work with his nephrologist yesterday and plans to send us  the results. Continue Farxiga  10 mg daily, torsemide  40 mg daily, and potassium 40mEq daily. Further titration of GDMT is limited by symptomatic hypotension. Patient would likely benefit from cardiac rehabilitation for CHF education and to improve functional capacity. Will send referral.   Pericardial effusion - Noted during admission 02/2024. Echo 04/29/2024 without evidence of pericardial effusion.   Persistent atrial fibrillation - S/p DCCV on 12/2023 and 01/2024. Maintaining sinus rhythm today. Metoprolol  recently discontinued by nephrology. Continue amiodarone  200 mg daily and Eliquis  2.5 mg twice daily (age and kidney function). Would like to discuss Watchman device with EP.    CKD stage IIIb - Most recent BUN/Cr of 33/2.32 on 04/08/2024. Had updated labs yesterday, unable to view. He will send results. Managed by nephrology.   Anemia History of GI bleed - Required transfusion during hospitalization 02/2024. Hgb 10.8 on 04/08/2024. Had updated CBC yesterday and plans to send results. Denies bleeding. Wishes to discuss Watchman device with EP.   Hyperlipidemia - Most recent lipid panel 12/2023 with LDL 68. Continue atorvastatin  40 mg daily.   OSA - Compliant with CPAP.    Disposition: Referral to cardiac rehab. F/u with Dr. Mady or an APP in 4 months.   Medication Adjustments/Labs and Tests Ordered: Current medicines are reviewed at length with the patient today.  Concerns regarding medicines are outlined above. Medication changes, Labs and Tests ordered today are summarized above and listed in the Patient Instructions accessible in Encounters.   Bonney Lesley Maffucci, PA-C 05/17/2024 2:53 PM     [facility] - [place] [address] [place], [place] [address] [phone]      [1]  Current Meds  Medication Sig   albuterol  (VENTOLIN  HFA) 108 (90 Base) MCG/ACT inhaler Inhale 2 puffs into the lungs every 6 (six) hours as needed for wheezing or shortness of breath.   Alpha-Lipoic Acid 600 MG TABS Take 1 tablet by mouth daily.   amiodarone  (PACERONE ) 200 MG tablet Take 1 tablet (200 mg total) by mouth daily.   apixaban  (ELIQUIS ) 2.5 MG TABS tablet Take 1 tablet (2.5 mg total) by mouth 2 (two) times daily.   atorvastatin  (LIPITOR) 40 MG tablet Take 1 tablet (40 mg total) by mouth daily.   cetirizine  (ZYRTEC ) 10 MG tablet Take 10 mg by mouth daily.   Cholecalciferol  (VITAMIN D-3) 25 MCG (1000 UT) CAPS Take 1,000 Units by mouth daily.   dapagliflozin  propanediol (FARXIGA ) 10 MG TABS tablet Take 1 tablet (10 mg total) by mouth daily.   dextromethorphan -guaiFENesin  (MUCINEX  DM) 30-600 MG 12hr tablet Take 1 tablet by mouth 2 (two)  times daily as needed for cough.   docusate sodium  (COLACE) 100 MG capsule Take 1 capsule (100 mg total) by mouth 2 (two) times daily.   fluticasone  (FLONASE ) 50 MCG/ACT nasal spray PLACE 1 SPRAY INTO BOTH NOSTRILS DAILY AS NEEDED FOR ALLERGIES OR RHINITIS.   folic acid  (FOLVITE ) 1 MG tablet Take 1 tablet (1 mg total) by mouth daily.   hydrocortisone  2.5 % cream APPLY TO AFFECTED AREA TWICE A DAY AS NEEDED FOR RASH   ketoconazole  (NIZORAL ) 2 % cream Apply once or twice daily to affected skin folds as needed for rash   midodrine  (PROAMATINE )  5 MG tablet Take 1 tablet (5 mg total) by mouth in the morning and 1 tablet (5 mg total) in the evening and 1 tablet (5 mg total) before bedtime.   omeprazole  (PRILOSEC) 20 MG capsule Take 1 capsule (20 mg total) by mouth daily as needed.   potassium chloride  SA (KLOR-CON  M) 20 MEQ tablet Take 2 tablets (40 mEq total) by mouth daily.   sertraline  (ZOLOFT ) 100 MG tablet Take 1 tablet (100 mg total) by mouth daily.   tadalafil  (CIALIS ) 5 MG tablet Take 1 tablet (5 mg total) by mouth every other day.   tamsulosin  (FLOMAX ) 0.4 MG CAPS capsule Take 1 capsule (0.4 mg total) by mouth daily.   thiamine  (VITAMIN B1) 100 MG tablet Take 100 mg by mouth daily.   torsemide  (DEMADEX ) 20 MG tablet Take 2 tablets (40 mg total) by mouth daily.   vitamin B-12 (CYANOCOBALAMIN ) 1000 MCG tablet Take 1,000 mcg by mouth daily.   zinc  gluconate 50 MG tablet Take 50 mg by mouth daily.   "

## 2024-05-17 NOTE — Patient Instructions (Signed)
 Medication Instructions:  Your physician recommends that you continue on your current medications as directed. Please refer to the Current Medication list given to you today.  *If you need a refill on your cardiac medications before your next appointment, please call your pharmacy*  Lab Work: No labs ordered today  If you have labs (blood work) drawn today and your tests are completely normal, you will receive your results only by: MyChart Message (if you have MyChart) OR A paper copy in the mail If you have any lab test that is abnormal or we need to change your treatment, we will call you to review the results.  Testing/Procedures: No test ordered today   Follow-Up: At [facility], you and your health needs are our priority.  As part of our continuing mission to provide you with exceptional heart care, our providers are all part of one team.  This team includes your primary Cardiologist (physician) and Advanced Practice Providers or APPs (Physician Assistants and Nurse Practitioners) who all work together to provide you with the care you need, when you need it.  Your next appointment:   4 month(s)  Provider:   You may see Lonni Hanson, MD or one of the following Advanced Practice Providers on your designated Care Team:   Lonni Meager, NP Lesley Maffucci, PA-C Bernardino Bring, PA-C Cadence Colfax, PA-C Tylene Lunch, NP Barnie Hila, NP    We recommend signing up for the patient portal called MyChart.  Sign up information is provided on this After Visit Summary.  MyChart is used to connect with patients for Virtual Visits (Telemedicine).  Patients are able to view lab/test results, encounter notes, upcoming appointments, etc.  Non-urgent messages can be sent to your provider as well.   To learn more about what you can do with MyChart, go to [email or web address].   Other Instructions Referral to cardiac Rehab

## 2024-05-21 ENCOUNTER — Other Ambulatory Visit: Payer: Self-pay

## 2024-05-21 DIAGNOSIS — I5042 Chronic combined systolic (congestive) and diastolic (congestive) heart failure: Secondary | ICD-10-CM

## 2024-05-22 ENCOUNTER — Encounter: Payer: Self-pay | Admitting: Cardiology

## 2024-05-22 ENCOUNTER — Ambulatory Visit: Admitting: Cardiology

## 2024-05-22 VITALS — BP 110/74 | HR 81 | Ht 68.5 in | Wt 202.0 lb

## 2024-05-22 DIAGNOSIS — I4819 Other persistent atrial fibrillation: Secondary | ICD-10-CM

## 2024-05-22 DIAGNOSIS — N1832 Chronic kidney disease, stage 3b: Secondary | ICD-10-CM | POA: Diagnosis not present

## 2024-05-22 DIAGNOSIS — D649 Anemia, unspecified: Secondary | ICD-10-CM

## 2024-05-22 DIAGNOSIS — Z79899 Other long term (current) drug therapy: Secondary | ICD-10-CM | POA: Diagnosis not present

## 2024-05-22 DIAGNOSIS — D6869 Other thrombophilia: Secondary | ICD-10-CM

## 2024-05-22 DIAGNOSIS — K922 Gastrointestinal hemorrhage, unspecified: Secondary | ICD-10-CM

## 2024-05-22 NOTE — Progress Notes (Signed)
 " Electrophysiology Office Note:   Date:  05/22/2024  ID:  Cameron Foster., DOB 01/06/1941, MRN [id number]  Primary Cardiologist: Lonni Hanson, MD Electrophysiologist: Fonda Kitty, MD      History of Present Illness:   Cameron Foster. is a 84 y.o. male with h/o chronic HFimpEF, paroxysmal atrial fibrillation, hypertension, hyperlipidemia, OSA, CVA, subarachnoid hemorrhage, CKD stage IIIa, tobacco use, colitis with GI bleeding, prostate cancer, and alcohol use who is being seen today for Watchman device evaluation.   Discussed the use of AI scribe software for clinical note transcription with the patient, who gave verbal consent to proceed.  History of Present Illness Cameron Foster. Cameron Foster is an 84 year old male with atrial fibrillation who presents for consultation regarding the Watchman device. He is accompanied by his niece. He was referred for evaluation of the Watchman device due to his history of atrial fibrillation and bleeding complications from blood thinners.  He experiences shortness of breath on exertion, which limits his ability to walk long distances. He has a history of atrial fibrillation, increasing his risk of stroke. To mitigate this risk, he has been on blood thinners but has experienced bleeding complications, including the need for a blood transfusion. He is concerned about the bleeding risk, especially with upcoming dental procedures.  He is currently taking amiodarone  to manage his atrial fibrillation and mentions taking a total of nineteen pills daily.  He recalls a past incident where a catheterization procedure resulted in a severe skin reaction around his midsection, possibly due to tape or cleaning agents used during the procedure.  He has a history of a significant leg injury where he tore his calf muscle, which was treated by repositioning and taping the muscle. This leg occasionally cramps at night and remains swollen, but he reports no significant  mobility issues.   Review of systems complete and found to be negative unless listed in HPI.   EP Information / Studies Reviewed:    EKG is not ordered today. EKG from 05/17/24 reviewed which showed SR with PR and QRS .      Echo 04/29/24:  1. Left ventricular ejection fraction, by estimation, is 55 to 60%. The  left ventricle has normal function. The left ventricle has no regional  wall motion abnormalities. Left ventricular diastolic parameters were  normal.   2. Right ventricular systolic function is normal.   3. The mitral valve is degenerative. No evidence of mitral valve  regurgitation.   4. The aortic valve is tricuspid. Aortic valve regurgitation is trivial.  Aortic valve sclerosis is present, with no evidence of aortic valve  stenosis.   FINDINGS   Left Ventricle: Left ventricular ejection fraction, by estimation, is 55  to 60%. The left ventricle has normal function. The left ventricle has no  regional wall motion abnormalities. The left ventricular internal cavity  size was normal in size. Left  ventricular diastolic parameters were normal.    Risk Assessment/Calculations:    CHA2DS2-VASc Score = 7   This indicates a 11.2% annual risk of stroke. The patient's score is based upon: CHF History: 1 HTN History: 1 Diabetes History: 0 Stroke History: 2 Vascular Disease History: 1 Age Score: 2 Gender Score: 0             Physical Exam:   VS:  BP 110/74 (BP Location: Left Arm, Patient Position: Sitting, Cuff Size: Large)   Pulse 81   Ht 5' 8.5 (1.74 m)   Wt  202 lb (91.6 kg)   SpO2 92%   BMI 30.27 kg/m    Wt Readings from Last 3 Encounters:  05/22/24 202 lb (91.6 kg)  05/17/24 208 lb 12.8 oz (94.7 kg)  05/02/24 201 lb (91.2 kg)     General: Well developed, in no acute distress.  Neck: No JVD.  Cardiac: Normal rate, regular rhythm.  Resp: Normal work of breathing.  Ext: No edema.  Neuro: No gross focal deficits.  Psych: Normal affect.    ASSESSMENT AND PLAN:    I have seen Cameron Foster. in the office today who is being considered for a Watchman left atrial appendage closure device. I believe they will benefit from this procedure given their history of atrial fibrillation, CHA2DS2-VASc score of 7 and unadjusted ischemic stroke rate of 11.2% per year. Unfortunately, the patient is not felt to be a long term anticoagulation candidate secondary to GI bleeding. The patient's chart has been reviewed and I feel that they would be a candidate for short term oral anticoagulation after Watchman implant.   It is my belief that after undergoing a LAA closure procedure, Cameron Foster. will not need long term anticoagulation which eliminates anticoagulation side effects and major bleeding risk.   Procedural risks for the Watchman implant have been reviewed with the patient including a 0.5% risk of stroke, <1% risk of perforation and <1% risk of device embolization. Other risks include bleeding, vascular damage, tamponade, worsening renal function, and death. The patient understands these risk and wishes to proceed.     The published clinical data on the safety and effectiveness of WATCHMAN include but are not limited to the following: - Holmes DR, Jess BEARD, Sick P et al. for the PROTECT AF Investigators. Percutaneous closure of the left atrial appendage versus warfarin therapy for prevention of stroke in patients with atrial fibrillation: a randomised non-inferiority trial. Lancet 2009; 374: 534-42. GLENWOOD Jess BEARD, Doshi SK, Jonita VEAR Satchel D et al. on behalf of the PROTECT AF Investigators. Percutaneous Left Atrial Appendage Closure for Stroke Prophylaxis in Patients With Atrial Fibrillation 2.3-Year Follow-up of the PROTECT AF (Watchman Left Atrial Appendage System for Embolic Protection in Patients With Atrial Fibrillation) Trial. Circulation 2013; 127:720-729. - Alli O, Doshi S,  Kar S, Reddy VY, Sievert H et al. Quality of Life  Assessment in the Randomized PROTECT AF (Percutaneous Closure of the Left Atrial Appendage Versus Warfarin Therapy for Prevention of Stroke in Patients With Atrial Fibrillation) Trial of Patients at Risk for Stroke With Nonvalvular Atrial Fibrillation. J Am Coll Cardiol 2013; 61:1790-8. GLENWOOD Satchel DR, Archer RAMAN, Price M, Whisenant B, Sievert H, Doshi S, Huber K, Reddy V. Prospective randomized evaluation of the Watchman left atrial appendage Device in patients with atrial fibrillation versus long-term warfarin therapy; the PREVAIL trial. Journal of the [employer] of Cardiology, Vol. 4, No. 1, 2014, 1-11. - Kar S, Doshi SK, Sadhu A, Horton R, Osorio J et al. Primary outcome evaluation of a next-generation left atrial appendage closure device: results from the PINNACLE FLX trial. Circulation 2021;143(18)1754-1762.   HAS-BLED score 5 Hypertension Yes  Abnormal renal and liver function (Dialysis, transplant, Cr >2.26 mg/dL /Cirrhosis or Bilirubin >2x Normal or AST/ALT/AP >3x Normal) Yes  Stroke Yes  Bleeding Yes  Labile INR (Unstable/high INR) No  Elderly (>65) Yes  Drugs or alcohol (>= 8 drinks/week, anti-plt or NSAID) No   CHA2DS2-VASc Score = 7  The patient's score is based upon: CHF History: 1 HTN History:  1 Diabetes History: 0 Stroke History: 2 Vascular Disease History: 1 Age Score: 2 Gender Score: 0       ASSESSMENT AND PLAN: After today's visit with the patient which was dedicated solely for shared decision making visit regarding LAA closure device, the patient decided to proceed with the LAA appendage closure procedure scheduled to be done in the near future at [facility]. I reviewed his prior TEE and anatomy appears amenable to closure. Will not pursue CT scan due to CKD.   #Persistent atrial fibrillation:  #High risk medication use: Amiodarone .  -Continue amiodarone  200mg  daily.   #Hypercoagulable state due to AF #History of GI bleeding #Chronic anemia -Continue  Eliquis  2.5mg  BID for now. Watchman device as above.   #CKD stage IIIb -Minimize contrast use at time of implant.    Follow up with EP Team as usual post procedure  Signed, Fonda Kitty, MD  "

## 2024-05-22 NOTE — Patient Instructions (Signed)
 Medication Instructions:  Your physician recommends that you continue on your current medications as directed. Please refer to the Current Medication list given to you today.  *If you need a refill on your cardiac medications before your next appointment, please call your pharmacy*  Testing/Procedures: Watchman  Your physician has requested that you have Left atrial appendage (LAA) closure device implantation is a procedure to put a small device in the LAA of the heart. The LAA is a small sac in the wall of the heart's left upper chamber. Blood clots can form in this area. The device, Watchman closes the LAA to help prevent a blood clot and stroke.  You will be contacted by Nurse Navigator, Danielle to schedule your pre-procedure visit and procedure date. If you have any questions she can be reached at [phone].   Follow-Up: At [facility], you and your health needs are our priority.  As part of our continuing mission to provide you with exceptional heart care, our providers are all part of one team.  This team includes your primary Cardiologist (physician) and Advanced Practice Providers or APPs (Physician Assistants and Nurse Practitioners) who all work together to provide you with the care you need, when you need it.

## 2024-05-23 ENCOUNTER — Other Ambulatory Visit: Payer: Self-pay

## 2024-05-23 ENCOUNTER — Telehealth: Payer: Self-pay

## 2024-05-23 DIAGNOSIS — I480 Paroxysmal atrial fibrillation: Secondary | ICD-10-CM

## 2024-05-23 DIAGNOSIS — Z8719 Personal history of other diseases of the digestive system: Secondary | ICD-10-CM

## 2024-05-23 DIAGNOSIS — K921 Melena: Secondary | ICD-10-CM

## 2024-05-23 DIAGNOSIS — K922 Gastrointestinal hemorrhage, unspecified: Secondary | ICD-10-CM

## 2024-05-23 NOTE — Telephone Encounter (Signed)
 Per Dr. Kennyth, okay to proceed with LAAO with no CT prior due to CKD.  Spoke with patient. Confirmed he would like to proceed with LAAO implant. Arranged implant 06/13/2024 at 11 AM (830 AM arrival time). Reviewed will need non-fasting lab work prior - will place orders for [facility].  Advised will need to get dial antibacterial soap. Explained will need to hold Farxiga  3 days prior. Instructions will be sent via MyChart. Patient verbalized understanding and grateful for assistance.

## 2024-05-23 NOTE — Telephone Encounter (Signed)
 Patient called in. He had further questions regarding the Watchman procedure. All questions answered to his satisfaction. Patient requested follow up appt with Suzann be moved to later in the afternoon. Appt rescheduled per his request.   Patient grateful for assistance.

## 2024-05-24 ENCOUNTER — Telehealth: Payer: Self-pay

## 2024-05-24 ENCOUNTER — Ambulatory Visit: Admission: RE | Admit: 2024-05-24

## 2024-05-24 ENCOUNTER — Ambulatory Visit: Payer: Self-pay

## 2024-05-24 DIAGNOSIS — D638 Anemia in other chronic diseases classified elsewhere: Secondary | ICD-10-CM

## 2024-05-24 DIAGNOSIS — R319 Hematuria, unspecified: Secondary | ICD-10-CM

## 2024-05-24 DIAGNOSIS — R8290 Unspecified abnormal findings in urine: Secondary | ICD-10-CM

## 2024-05-24 DIAGNOSIS — N1832 Chronic kidney disease, stage 3b: Secondary | ICD-10-CM

## 2024-05-24 NOTE — Telephone Encounter (Signed)
 FYI Only or Action Required?: Action required by provider: clinical question for provider and update on patient condition.  Patient was last seen in primary care on 05/02/2024 by Myrla Jon [name], MD.  Called Nurse Triage reporting Fall.  Symptoms began today.  Interventions attempted: OTC medications: tylenol .  Symptoms are: unchanged.  Triage Disposition: See PCP When Office is Open (Within 3 Days)  Patient/caregiver understands and will follow disposition?: No, wishes to speak with PCP     Message from [place] R sent at 05/24/2024 10:30 AM EST  Clemens and hit his head on concrete yesterday, did not black out, but he has a headache and is on blood thinner. Would like to know what to do.     Reason for Disposition  [1] Taking Coumadin (warfarin) or other strong blood thinner AND [2] falling is a recurrent problem  Answer Assessment - Initial Assessment Questions Pt called to report fall yesterday, where he tripped and fell backwards hitting his tailbone and the back of his head. Pt reports being on a blood thinner. No LOC, no dizziness, no n/v, no vision changes or h/a after event. Pt reports waking up with a mild h/a today and taking tylenol  to relieve. Pt states he has had sinus drainage so unsure if pain is sinus vs. Fall. Pain is specifically frontal, between eyes down to his nose. Pt is having U/S today at 3:15 and questioned if he could have CT of his head at that location. Pt does not want to go to ED d/t appt today for renal study and h/a being relieved with tylenol . Pt was not going to report but sister is a retired engineer, civil (consulting) and stated he needed to report and get eval d/t blood thinner. Pt tried to contact facility but they would need order for head CT.  Discussed this would be provider preference, pt is available via phone. Please advise.       1. MECHANISM: How did the fall happen?     Pt reports falling backward yesterday after slipping on his stoop step  2. DOMESTIC  VIOLENCE AND ELDER ABUSE SCREENING: Did you fall because someone pushed you or tried to hurt you? If Yes, ask: Are you safe now?     No   3. ONSET: When did the fall happen? (e.g., minutes, hours, or days ago)     Yesterday   4. LOCATION: What part of the body hit the ground? (e.g., back, buttocks, head, hips, knees, hands, head, stomach)     Tailbone and back of the head   5. INJURY: Did you hurt (injure) yourself when you fell? If Yes, ask: What did you injure? Tell me more about this? (e.g., body area; type of injury; pain severity)     No cuts/wounds, pt states he is stiff today   6. PAIN: Is there any pain? If Yes, ask: How bad is the pain? (e.g., Scale 0-10; or none, mild,      Yes mild h/a; pt unsure if it is r/t sinus pressure as he does have sinus drainage vs. Fall   7. SIZE: For cuts, bruises, or swelling, ask: How large is it? (e.g., inches or centimeters)      None   9. OTHER SYMPTOMS: Do you have any other symptoms? (e.g., dizziness, fever, weakness; new-onset or worsening).      None   10. CAUSE: What do you think caused the fall (or falling)? (e.g., dizzy spell, tripped)       Tripped  Protocols  used: Falls and [employer]

## 2024-05-24 NOTE — Telephone Encounter (Signed)
 Copied from CRM [id number]. Topic: Clinical - Medical Advice >> May 24, 2024 12:36 PM Lonell PEDLAR wrote: Reason for CRM: Patient is requesting a call back from office to see where he can complete MRI, that is not the hospital. [phone]

## 2024-05-24 NOTE — Telephone Encounter (Signed)
 Attempted to contact pt, mailbox has not been activated unable to leave a message.

## 2024-05-24 NOTE — Telephone Encounter (Signed)
 Copied from CRM [id number]. Topic: Clinical - Medical Advice >> May 24, 2024 12:36 PM Lonell PEDLAR wrote: Reason for CRM: Patient is requesting a call back from office to see where he can complete MRI, that is not the hospital. [phone] >> May 24, 2024  1:46 PM Donna E wrote: Patient calling asking for update on MRI and asking to speak with a nurse.  Called [facility] and spoke to [facility] who stated to call Nurse Triage

## 2024-05-24 NOTE — Telephone Encounter (Signed)
 NO triage completed  Patient called to ask for MRI orders of his head to be completed at his scheduled renal ultrasound for this afternoon.   Explained that per Dr. Myrla:  Falling and hitting his head, especially on a blood thinner is an urgent conern and he needs to be seen urgently. Need assessment and also likely imaging pending assessment. Agree that he should go to ED and get seen.   Patient refuses instruction stating that he will not go to ER because the wait is too long

## 2024-05-24 NOTE — Telephone Encounter (Signed)
 Third encounter on same information. Please review pt chart at previous encounter.

## 2024-05-24 NOTE — Telephone Encounter (Signed)
 Copied from CRM #[id number]. Topic: General - Other >> May 24, 2024  2:14 PM Gustabo D wrote: Pt is returning a call to see where he can go for MRI- I confirmed with office he needs to make a appt with pcp first. He says he's headed to another appt and doesn't have time. I told him he can call back another time to get scheduled. He doesn't want to do that he says it takes to long to get scheduled.

## 2024-05-24 NOTE — Telephone Encounter (Signed)
 Duplicate encounter. Please view other encounter in pt chart.

## 2024-05-24 NOTE — Telephone Encounter (Signed)
 We have attempted calling pt and everytime send to vm and mail box not set up. Pt will need to schedule an appointment. E2C2 please advise pt an appointment will be needed for any imaging.

## 2024-05-24 NOTE — Telephone Encounter (Signed)
 Falling and hitting his head, especially on a blood thinner is an urgent conern and he needs to be seen urgently.  Need assessment and also likely imaging pending assessment.  Agree that he should go to ED and get seen.

## 2024-06-05 ENCOUNTER — Ambulatory Visit: Payer: No Typology Code available for payment source

## 2024-06-13 ENCOUNTER — Encounter (HOSPITAL_COMMUNITY): Payer: Self-pay

## 2024-06-13 ENCOUNTER — Inpatient Hospital Stay (HOSPITAL_COMMUNITY): Admit: 2024-06-13 | Admitting: Cardiology

## 2024-06-17 ENCOUNTER — Ambulatory Visit: Admitting: Family Medicine

## 2024-06-24 ENCOUNTER — Ambulatory Visit: Admitting: Family

## 2024-07-29 ENCOUNTER — Ambulatory Visit: Admitting: Cardiology

## 2024-09-18 ENCOUNTER — Ambulatory Visit: Admitting: Internal Medicine
